# Patient Record
Sex: Female | Born: 1995
Health system: Southern US, Community
[De-identification: ages and names within clinical notes are randomized; demographics above are authoritative.]

## PROBLEM LIST (undated history)

## (undated) ENCOUNTER — Emergency Department (HOSPITAL_COMMUNITY): Admission: EM | Payer: Medicaid Other | Source: Home / Self Care

## (undated) DIAGNOSIS — I1A0 Resistant hypertension: Secondary | ICD-10-CM

## (undated) DIAGNOSIS — I517 Cardiomegaly: Secondary | ICD-10-CM

## (undated) DIAGNOSIS — K219 Gastro-esophageal reflux disease without esophagitis: Secondary | ICD-10-CM

## (undated) DIAGNOSIS — R9431 Abnormal electrocardiogram [ECG] [EKG]: Secondary | ICD-10-CM

## (undated) DIAGNOSIS — T783XXA Angioneurotic edema, initial encounter: Secondary | ICD-10-CM

## (undated) DIAGNOSIS — G43909 Migraine, unspecified, not intractable, without status migrainosus: Secondary | ICD-10-CM

## (undated) DIAGNOSIS — N179 Acute kidney failure, unspecified: Secondary | ICD-10-CM

## (undated) DIAGNOSIS — N186 End stage renal disease: Secondary | ICD-10-CM

## (undated) DIAGNOSIS — M329 Systemic lupus erythematosus, unspecified: Secondary | ICD-10-CM

## (undated) DIAGNOSIS — IMO0002 Reserved for concepts with insufficient information to code with codable children: Secondary | ICD-10-CM

## (undated) DIAGNOSIS — D75A Glucose-6-phosphate dehydrogenase (G6PD) deficiency without anemia: Secondary | ICD-10-CM

## (undated) DIAGNOSIS — I10 Essential (primary) hypertension: Secondary | ICD-10-CM

## (undated) DIAGNOSIS — Z992 Dependence on renal dialysis: Secondary | ICD-10-CM

## (undated) DIAGNOSIS — M351 Other overlap syndromes: Secondary | ICD-10-CM

## (undated) DIAGNOSIS — N289 Disorder of kidney and ureter, unspecified: Secondary | ICD-10-CM

## (undated) HISTORY — DX: End stage renal disease: N18.6

## (undated) HISTORY — DX: Cardiomegaly: I51.7

## (undated) HISTORY — DX: Abnormal electrocardiogram (ECG) (EKG): R94.31

## (undated) HISTORY — PX: MULTIPLE TOOTH EXTRACTIONS: SHX2053

---
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
## 2014-01-05 ENCOUNTER — Encounter: Payer: Self-pay | Admitting: Obstetrics

## 2014-01-05 ENCOUNTER — Ambulatory Visit (INDEPENDENT_AMBULATORY_CARE_PROVIDER_SITE_OTHER): Payer: Medicaid Other | Admitting: Obstetrics

## 2014-01-05 VITALS — BP 116/77 | HR 80 | Temp 98.2°F | Ht 65.0 in | Wt 226.0 lb

## 2014-01-05 DIAGNOSIS — Z3202 Encounter for pregnancy test, result negative: Secondary | ICD-10-CM

## 2014-01-05 DIAGNOSIS — Z3046 Encounter for surveillance of implantable subdermal contraceptive: Secondary | ICD-10-CM

## 2014-01-05 DIAGNOSIS — Z309 Encounter for contraceptive management, unspecified: Secondary | ICD-10-CM

## 2014-01-05 DIAGNOSIS — Z30017 Encounter for initial prescription of implantable subdermal contraceptive: Secondary | ICD-10-CM

## 2014-01-05 MED ORDER — ETONOGESTREL 68 MG ~~LOC~~ IMPL: 68.0000 mg | DRUG_IMPLANT | Freq: Once | SUBCUTANEOUS | Status: DC

## 2014-01-05 NOTE — Progress Notes (Signed)
Nexplanon Procedure Note   PRE-OP DIAGNOSIS: desired long-term, reversible contraception  POST-OP DIAGNOSIS: Same  PROCEDURE: Nexplanon  placement Performing Provider: Clenton Pare. Jodi Mourning MD  Patient education prior to procedure, explained risk, benefits of Nexplanon, reviewed alternative options. Patient reported understanding. Gave consent to continue with procedure.   PROCEDURE:  Pregnancy Text :  Negative Site (check):      left arm         Sterile Preparation:   Betadinex3 Lot # [id number] / [id number] Expiration Date 07 / 2017  Insertion site was selected 8 - 10 cm from medial epicondyle and marked along with guiding site using sterile marker. Procedure area was prepped and draped in a sterile fashion. 1% Lidocaine 1.5 ml given prior to procedure. Nexplanon  was inserted subcutaneously.Needle was removed from the insertion site. Nexplanon capsule was palpated by provider and patient to assure satisfactory placement. Dressing applied.  Followup: The patient tolerated the procedure well without complications.  Standard post-procedure care is explained and return precautions are given.  Charles A. Jodi Mourning MD

## 2014-01-05 NOTE — Progress Notes (Signed)
[place] REMOVAL NOTE  Date of LMP:   unknown  Contraception used: *Nexplanon   Indications:  The patient desires removal of Nexplanon.  She understands risks, benefits, and alternatives to Implanon and would like to proceed.  Anesthesia:   Lidocaine 1% plain.  Procedure:  A time-out was performed confirming the procedure and the patient's allergy status.  Complications: None                      The rod was palpated and the area was sterilely prepped.  The area beneath the distal tip was anesthetized with 1% xylocaine and the skin incised                       Over the tip and the tip was exposed, grasped with forcep and removed intact.  A single suture of 4-0 Vicryl was used to close incision.  Steri strip                       And a bandage applied and the arm was wrapped with gauze bandage.  The patient tolerated well.  Instructions:  The patient was instructed to remove the dressing in 24 hours and that some bruising is to be expected.  She was advised to use over the counter analgesics as needed for any pain at the site.  She is to keep the area dry for 24 hours and to call if her hand or arm becomes cold, numb, or blue.  Return visit:  Return in 2 weeks

## 2014-01-08 ENCOUNTER — Encounter: Payer: Self-pay | Admitting: Obstetrics

## 2014-01-09 ENCOUNTER — Encounter: Payer: Self-pay | Admitting: Advanced Practice Midwife

## 2014-01-19 ENCOUNTER — Encounter: Payer: Self-pay | Admitting: Advanced Practice Midwife

## 2014-01-19 ENCOUNTER — Ambulatory Visit (INDEPENDENT_AMBULATORY_CARE_PROVIDER_SITE_OTHER): Payer: Medicaid Other | Admitting: Advanced Practice Midwife

## 2014-01-19 VITALS — BP 120/78 | HR 91 | Temp 98.0°F | Wt 224.0 lb

## 2014-01-19 DIAGNOSIS — A599 Trichomoniasis, unspecified: Secondary | ICD-10-CM | POA: Insufficient documentation

## 2014-01-19 DIAGNOSIS — Z975 Presence of (intrauterine) contraceptive device: Secondary | ICD-10-CM

## 2014-01-19 DIAGNOSIS — IMO0002 Reserved for concepts with insufficient information to code with codable children: Secondary | ICD-10-CM | POA: Insufficient documentation

## 2014-01-19 DIAGNOSIS — Z113 Encounter for screening for infections with a predominantly sexual mode of transmission: Secondary | ICD-10-CM

## 2014-01-19 DIAGNOSIS — Z789 Other specified health status: Secondary | ICD-10-CM

## 2014-01-19 NOTE — Progress Notes (Signed)
Subjective:     Patient ID: Linda [name], female   DOB: 1996/03/06, 18 y.o.   MRN: [id number]  Exposure to STD  The patient's pertinent negatives include no discharge, dyspareunia, dysuria, genital itching, genital lesions or pelvic pain. Primary symptoms comment: Patient is S/P treatment of trich. Reports partner did not get evaluated or treated. She is currently asymptomatic. Also here today for Nexplanon check.. The vaginal discharge was normal. Pertinent negatives include no abdominal pain, genital odor or urinary frequency. She has tried nothing for the symptoms. Risk factors include history of STDs.     Review of Systems  Constitutional: Negative.   Cardiovascular: Negative.   Gastrointestinal: Negative.  Negative for abdominal pain.  Genitourinary: Negative.  Negative for dysuria, frequency, pelvic pain and dyspareunia.  Musculoskeletal: Negative.   Skin: Negative.   Neurological: Negative.   Hematological: Negative.   Psychiatric/Behavioral: Negative.        Objective:   Physical Exam  Constitutional: She is oriented to person, place, and time. She appears well-developed and well-nourished.  Musculoskeletal: Normal range of motion.       Arms: Neurological: She is alert and oriented to person, place, and time.  Skin: Skin is warm and dry.  Psychiatric: She has a normal mood and affect. Her behavior is normal. Judgment and thought content normal.   Filed Vitals:   01/19/14 1020  BP: 120/78  Pulse: 91  Temp: 98 F (36.7 C)   Filed Vitals:   01/19/14 1020  Weight: 224 lb (101.606 kg)        Assessment:     +Trich, partner did not get treatment Nexplanon in place  Patient Active Problem List   Diagnosis Date Noted  . Trichimoniasis 01/19/2014  . High BMI 01/19/2014  . Implanon in place 01/19/2014        Plan:     Patient's partner has changed his mind and plans to be evaluated at the health dept. Patient to RTC if symptoms return. I also will retreat  patient once her partner has completed the medication. Nexplanon in place and patient w/ out complaints. Remove PRN or in 3 years. Remaining STI panel completed today.  Amy Roni Bread CNM

## 2014-01-20 LAB — HEPATITIS B SURFACE ANTIGEN: Hepatitis B Surface Ag: NEGATIVE

## 2014-01-20 LAB — HEPATITIS C ANTIBODY: HCV Ab: NEGATIVE

## 2014-01-20 LAB — RPR

## 2014-01-20 LAB — HIV ANTIBODY (ROUTINE TESTING W REFLEX): HIV 1&2 Ab, 4th Generation: NONREACTIVE

## 2014-02-25 ENCOUNTER — Emergency Department (HOSPITAL_COMMUNITY)
Admission: EM | Admit: 2014-02-25 | Discharge: 2014-02-25 | Disposition: A | Discharge status: Home / Self Care | Payer: Medicaid Other | Attending: Emergency Medicine | Admitting: Emergency Medicine

## 2014-02-25 ENCOUNTER — Encounter (HOSPITAL_COMMUNITY): Payer: Self-pay | Admitting: Emergency Medicine

## 2014-02-25 DIAGNOSIS — F17200 Nicotine dependence, unspecified, uncomplicated: Secondary | ICD-10-CM | POA: Insufficient documentation

## 2014-02-25 DIAGNOSIS — Z79899 Other long term (current) drug therapy: Secondary | ICD-10-CM | POA: Insufficient documentation

## 2014-02-25 DIAGNOSIS — Z8739 Personal history of other diseases of the musculoskeletal system and connective tissue: Secondary | ICD-10-CM | POA: Insufficient documentation

## 2014-02-25 DIAGNOSIS — J029 Acute pharyngitis, unspecified: Secondary | ICD-10-CM | POA: Insufficient documentation

## 2014-02-25 DIAGNOSIS — G43909 Migraine, unspecified, not intractable, without status migrainosus: Secondary | ICD-10-CM | POA: Insufficient documentation

## 2014-02-25 LAB — RAPID STREP SCREEN (MED CTR MEBANE ONLY): Streptococcus, Group A Screen (Direct): NEGATIVE

## 2014-02-25 MED ORDER — IBUPROFEN 100 MG/5ML PO SUSP
600.0000 mg | Freq: Once | ORAL | Status: AC
  Administered 2014-02-25: 600 mg via ORAL
  Filled 2014-02-25: qty 30

## 2014-02-25 MED ORDER — IBUPROFEN 100 MG/5ML PO SUSP
600.0000 mg | Freq: Once | ORAL | Status: DC
  Filled 2014-02-25: qty 30

## 2014-02-25 MED ORDER — DEXAMETHASONE SODIUM PHOSPHATE 10 MG/ML IJ SOLN
10.0000 mg | Freq: Once | INTRAMUSCULAR | Status: AC
Start: 2014-02-25 — End: 2014-02-25
  Administered 2014-02-25: 10 mg via INTRAMUSCULAR
  Filled 2014-02-25: qty 1

## 2014-02-25 MED ORDER — PENICILLIN G BENZATHINE 1200000 UNIT/2ML IM SUSP
1.2000 10*6.[IU] | Freq: Once | INTRAMUSCULAR | Status: AC
  Administered 2014-02-25: 1.2 10*6.[IU] via INTRAMUSCULAR
  Filled 2014-02-25: qty 2

## 2014-02-25 NOTE — ED Notes (Signed)
Pharmacy sent syringes of Ibuprofen 100mg /60ml but when scanned showed as 20 mg per syringe.  Called pharmacy, was instructed to send back and Ibuprofen resent.

## 2014-02-25 NOTE — ED Notes (Signed)
Patient declines wheelchair at discharge.  Patient escorted to lobby by RN.

## 2014-02-25 NOTE — ED Provider Notes (Signed)
CSN: [id number]     Arrival date & time 02/25/14  [id number] History  This chart was scribed for Mercy Moore PA-C, a non-physician practitioner working with Orpah Greek, * by Denice Bors, ED Scribe. This patient was seen in room TR09C/TR09C and the patient's care was started at 11:10 AM    Chief Complaint  Patient presents with  . Sore Throat    The history is provided by the patient. No language interpreter was used.   HPI Comments: Linda [name] is a 18 y.o. female who presents to the Emergency Department with PMH of lupus and mixed connective tissue diease complaining of constant moderate sore throat onset 2 days. Describes sore throat as gradually worsening in severity. Reports associated myalgias, rhinorrhea, and pain with swallowing. Denies trying any alleviating factors. Reports pain is exacerbated with swallowing. Denies associated fever, dysphagia, voice change, otalgia, emesis, abdominal pain, diarrhea, and cough. No concerns for STDs. No known sick contacts.     Past Medical History  Diagnosis Date  . Lupus   . Migraines   . Mixed connective tissue disease    History reviewed. No pertinent past surgical history. History reviewed. No pertinent family history. History  Substance Use Topics  . Smoking status: Current Every Day Smoker  . Smokeless tobacco: Not on file  . Alcohol Use: No   OB History   Grav Para Term Preterm Abortions TAB SAB Ect Mult Living                  Review of Systems  Constitutional: Negative for fever, chills, activity change, appetite change and fatigue.  HENT: Positive for congestion, rhinorrhea and sore throat. Negative for ear discharge, ear pain, trouble swallowing and voice change.   Respiratory: Negative for cough.   Gastrointestinal: Negative.  Negative for nausea, vomiting and abdominal pain.  Musculoskeletal: Positive for myalgias. Negative for neck pain and neck stiffness.  Skin: Negative for rash.   Neurological: Negative for weakness and headaches.    Allergies  Review of patient's allergies indicates no known allergies.  Home Medications   Prior to Admission medications   Medication Sig Start Date End Date Taking? Authorizing Provider  etonogestrel (IMPLANON) 68 MG IMPL implant Inject 1 each into the skin once.   Yes Historical Provider, MD  ibuprofen (ADVIL,MOTRIN) 800 MG tablet Take 800 mg by mouth every 8 (eight) hours as needed for moderate pain.   Yes Historical Provider, MD  ondansetron (ZOFRAN ODT) 8 MG disintegrating tablet Take 1 tablet (8 mg total) by mouth every 8 (eight) hours as needed for nausea or vomiting. 12/25/13  Yes Kalman Drape, MD  SUMAtriptan (IMITREX) 100 MG tablet Take 100 mg by mouth every 2 (two) hours as needed for migraine or headache. May repeat in 2 hours if headache persists or recurs.   Yes Historical Provider, MD  topiramate (TOPAMAX) 25 MG tablet Take 100 mg by mouth daily.   Yes Historical Provider, MD   BP 120/65  Pulse 81  Temp(Src) 98.6 F (37 C) (Oral)  Resp 22  Ht 5\' 6"  (1.676 m)  Wt 222 lb 3.2 oz (100.789 kg)  BMI 35.88 kg/m2  SpO2 100%  Filed Vitals:   02/25/14 0952 02/25/14 1136  BP: 120/65 115/62  Pulse: 81 84  Temp: 98.6 F (37 C) 98.8 F (37.1 C)  TempSrc: Oral Oral  Resp: 22 18  Height: 5\' 6"  (1.676 m)   Weight: 222 lb 3.2 oz (100.789 kg)   SpO2:  100% 100%    Physical Exam  Nursing note and vitals reviewed. Constitutional: She is oriented to person, place, and time. She appears well-developed and well-nourished. No distress.  Non-toxic  HENT:  Head: Normocephalic and atraumatic.  Right Ear: Tympanic membrane, external ear and ear canal normal.  Left Ear: Tympanic membrane, external ear and ear canal normal.  Mouth/Throat: Uvula is midline. No trismus in the jaw. Oropharyngeal exudate and posterior oropharyngeal erythema present. No posterior oropharyngeal edema or tonsillar abscesses.  Tonsillar hypertrophy 2+  bilaterally with exudates. Uvula midline. No trismus. No difficulty controlling secretions. No voice change. Tympanic membranes gray and translucent bilaterally with no erythema, edema, or hemotympanum.  No mastoid or tragal tenderness bilaterally.   Eyes: Conjunctivae and EOM are normal. Pupils are equal, round, and reactive to light. Right eye exhibits no discharge. Left eye exhibits no discharge.  Neck: Normal range of motion. Neck supple.  Bilateral anterior cervical lymphadenopathy. Lymph nodes <1 cm, firm, mobile and non-tender  Cardiovascular: Normal rate, regular rhythm and normal heart sounds.  Exam reveals no gallop and no friction rub.   No murmur heard. Pulmonary/Chest: Effort normal and breath sounds normal. No respiratory distress. She has no wheezes. She has no rales. She exhibits no tenderness.  Abdominal: Soft. She exhibits no distension. There is no tenderness. There is no rebound and no guarding.  Musculoskeletal: Normal range of motion. She exhibits no edema and no tenderness.  Lymphadenopathy:    She has cervical adenopathy.  Neurological: She is alert and oriented to person, place, and time.  Skin: Skin is warm and dry.  Psychiatric: She has a normal mood and affect. Her behavior is normal.    ED Course  Procedures (including critical care time) COORDINATION OF CARE:  Nursing notes reviewed. Vital signs reviewed. Initial pt interview and examination performed.   Filed Vitals:   02/25/14 0952 02/25/14 1136  BP: 120/65 115/62  Pulse: 81 84  Temp: 98.6 F (37 C) 98.8 F (37.1 C)  TempSrc: Oral Oral  Resp: 22 18  Height: 5\' 6"  (1.676 m)   Weight: 222 lb 3.2 oz (100.789 kg)   SpO2: 100% 100%    11:21 AM-Discussed work up plan with pt at bedside, which includes  Orders Placed This Encounter  Procedures  . Rapid strep screen    Standing Status: Standing     Number of Occurrences: 1     Standing Expiration Date:   . Culture, Group A Strep    Standing Status:  Standing     Number of Occurrences: 1     Standing Expiration Date:   . Pt agrees with plan.   Treatment plan initiated:Medications - No data to display   Initial diagnostic testing ordered.       Labs Review Labs Reviewed  RAPID STREP SCREEN  CULTURE, GROUP A STREP    Imaging Review No results found.   EKG Interpretation None      Results for orders placed during the hospital encounter of 02/25/14  RAPID STREP SCREEN      Result Value Ref Range   Streptococcus, Group A Screen (Direct) NEGATIVE  NEGATIVE     MDM   Linda [name] is a 18 y.o. female who presents to the Emergency Department with PMH of lupus and mixed connective tissue disease complaining of constant moderate sore throat onset 2 days. Etiology of sore throat likely due to pharyngitis. Rapid strep negative. Due to patient's PMH and clinical presentation will treat with IM  penicillin, however, this may be viral in nature. Patient also given dose of decadron IM for tonsillar edema. Patient not on chronic steroids. No evidence of peritonsillar or retropharyngeal abscess. Patient afebrile and non-toxic in appearance. Patient able to tolerate PO challenge without difficulty. Instructed patient to follow-up with PCP if symptoms not improving or worsening. Return precautions, discharge instructions, and follow-up was discussed with the patient before discharge.     Discharge Medication List as of 02/25/2014 11:24 AM       Final impressions: 1. Pharyngitis      Mercy Moore PA-C   I personally performed the services described in this documentation, which was scribed in my presence. The recorded information has been reviewed and is accurate.         Lucila [place], PA-C 02/25/14 1538

## 2014-02-25 NOTE — ED Notes (Signed)
Per pt sts sore throat since yesterday. sts hurts to swallow and body aches.

## 2014-02-25 NOTE — Discharge Instructions (Signed)
Take ibuprofen for pain  Drink fluids and rest  Return to the emergency department if you develop any changing/worsening condition, inability to swallow, fever, changing voice, or any other concerns (please read additional information regarding your condition below)    Pharyngitis Pharyngitis is redness, pain, and swelling (inflammation) of your pharynx.  CAUSES  Pharyngitis is usually caused by infection. Most of the time, these infections are from viruses (viral) and are part of a cold. However, sometimes pharyngitis is caused by bacteria (bacterial). Pharyngitis can also be caused by allergies. Viral pharyngitis may be spread from person to person by coughing, sneezing, and personal items or utensils (cups, forks, spoons, toothbrushes). Bacterial pharyngitis may be spread from person to person by more intimate contact, such as kissing.  SIGNS AND SYMPTOMS  Symptoms of pharyngitis include:   Sore throat.   Tiredness (fatigue).   Low-grade fever.   Headache.  Joint pain and muscle aches.  Skin rashes.  Swollen lymph nodes.  Plaque-like film on throat or tonsils (often seen with bacterial pharyngitis). DIAGNOSIS  Your health care provider will ask you questions about your illness and your symptoms. Your medical history, along with a physical exam, is often all that is needed to diagnose pharyngitis. Sometimes, a rapid strep test is done. Other lab tests may also be done, depending on the suspected cause.  TREATMENT  Viral pharyngitis will usually get better in 3-4 days without the use of medicine. Bacterial pharyngitis is treated with medicines that kill germs (antibiotics).  HOME CARE INSTRUCTIONS   Drink enough water and fluids to keep your urine clear or pale yellow.   Only take over-the-counter or prescription medicines as directed by your health care provider:   If you are prescribed antibiotics, make sure you finish them even if you start to feel better.   Do not  take aspirin.   Get lots of rest.   Gargle with 8 oz of salt water ( tsp of salt per 1 qt of water) as often as every 1-2 hours to soothe your throat.   Throat lozenges (if you are not at risk for choking) or sprays may be used to soothe your throat. SEEK MEDICAL CARE IF:   You have large, tender lumps in your neck.  You have a rash.  You cough up green, yellow-brown, or bloody spit. SEEK IMMEDIATE MEDICAL CARE IF:   Your neck becomes stiff.  You drool or are unable to swallow liquids.  You vomit or are unable to keep medicines or liquids down.  You have severe pain that does not go away with the use of recommended medicines.  You have trouble breathing (not caused by a stuffy nose). MAKE SURE YOU:   Understand these instructions.  Will watch your condition.  Will get help right away if you are not doing well or get worse. Document Released: 08/17/2005 Document Revised: 06/07/2013 Document Reviewed: 04/24/2013 [facility] Patient Information 2015 [place], [place]. This information is not intended to replace advice given to you by your health care provider. Make sure you discuss any questions you have with your health care provider.

## 2014-02-26 NOTE — ED Provider Notes (Signed)
Medical screening examination/treatment/procedure(s) were performed by non-physician practitioner and as supervising physician I was immediately available for consultation/collaboration.   EKG Interpretation None        Orpah Greek, MD 02/26/14 1623

## 2014-02-27 LAB — CULTURE, GROUP A STREP

## 2014-02-28 ENCOUNTER — Telehealth (HOSPITAL_BASED_OUTPATIENT_CLINIC_OR_DEPARTMENT_OTHER): Payer: Self-pay

## 2014-02-28 NOTE — Telephone Encounter (Signed)
Post ED Visit - Positive Culture Follow-up  Culture report reviewed by antimicrobial stewardship pharmacist: [x]  Wes Dulaney, Pharm.D., BCPS []  Heide Guile, Pharm.D., BCPS []  Alycia Rossetti, Pharm.D., BCPS []  [place], [place].D., BCPS, AAHIVP []  Legrand Como, Pharm.D., BCPS, AAHIVP []    Positive Group A Strep culture Treated with PCN IM in ED, organism sensitive to the same and no further patient follow-up is required at this time.  Dortha Kern 02/28/2014, 2:57 PM

## 2015-02-24 ENCOUNTER — Emergency Department (HOSPITAL_COMMUNITY)
Admission: EM | Admit: 2015-02-24 | Discharge: 2015-02-25 | Disposition: A | Discharge status: Home / Self Care | Payer: Medicaid Other | Attending: Emergency Medicine | Admitting: Emergency Medicine

## 2015-02-24 ENCOUNTER — Emergency Department (HOSPITAL_COMMUNITY): Payer: Medicaid Other

## 2015-02-24 ENCOUNTER — Encounter (HOSPITAL_COMMUNITY): Payer: Self-pay | Admitting: *Deleted

## 2015-02-24 DIAGNOSIS — Z720 Tobacco use: Secondary | ICD-10-CM | POA: Diagnosis not present

## 2015-02-24 DIAGNOSIS — M25571 Pain in right ankle and joints of right foot: Secondary | ICD-10-CM | POA: Diagnosis present

## 2015-02-24 DIAGNOSIS — L03115 Cellulitis of right lower limb: Secondary | ICD-10-CM | POA: Insufficient documentation

## 2015-02-24 DIAGNOSIS — Z79899 Other long term (current) drug therapy: Secondary | ICD-10-CM | POA: Insufficient documentation

## 2015-02-24 DIAGNOSIS — Z8679 Personal history of other diseases of the circulatory system: Secondary | ICD-10-CM | POA: Insufficient documentation

## 2015-02-24 DIAGNOSIS — X58XXXD Exposure to other specified factors, subsequent encounter: Secondary | ICD-10-CM | POA: Diagnosis not present

## 2015-02-24 DIAGNOSIS — S92901G Unspecified fracture of right foot, subsequent encounter for fracture with delayed healing: Secondary | ICD-10-CM

## 2015-02-24 DIAGNOSIS — S92341G Displaced fracture of fourth metatarsal bone, right foot, subsequent encounter for fracture with delayed healing: Secondary | ICD-10-CM | POA: Insufficient documentation

## 2015-02-24 DIAGNOSIS — Z3202 Encounter for pregnancy test, result negative: Secondary | ICD-10-CM | POA: Insufficient documentation

## 2015-02-24 DIAGNOSIS — M25572 Pain in left ankle and joints of left foot: Secondary | ICD-10-CM | POA: Diagnosis not present

## 2015-02-24 DIAGNOSIS — L03119 Cellulitis of unspecified part of limb: Secondary | ICD-10-CM

## 2015-02-24 LAB — I-STAT CHEM 8, ED
BUN: 7 mg/dL (ref 6–20)
Calcium, Ion: 1.19 mmol/L (ref 1.12–1.23)
Chloride: 102 mmol/L (ref 101–111)
Creatinine, Ser: 0.7 mg/dL (ref 0.44–1.00)
Glucose, Bld: 77 mg/dL (ref 65–99)
HCT: 41 % (ref 36.0–46.0)
Hemoglobin: 13.9 g/dL (ref 12.0–15.0)
Potassium: 3.8 mmol/L (ref 3.5–5.1)
Sodium: 140 mmol/L (ref 135–145)
TCO2: 22 mmol/L (ref 0–100)

## 2015-02-24 LAB — CBC WITH DIFFERENTIAL/PLATELET
Basophils Absolute: 0.1 10*3/uL (ref 0.0–0.1)
Basophils Relative: 1 % (ref 0–1)
Eosinophils Absolute: 0.6 10*3/uL (ref 0.0–0.7)
Eosinophils Relative: 5 % (ref 0–5)
HCT: 37.8 % (ref 36.0–46.0)
Hemoglobin: 12.5 g/dL (ref 12.0–15.0)
Lymphocytes Relative: 39 % (ref 12–46)
Lymphs Abs: 4.2 10*3/uL — ABNORMAL HIGH (ref 0.7–4.0)
MCH: 31.6 pg (ref 26.0–34.0)
MCHC: 33.1 g/dL (ref 30.0–36.0)
MCV: 95.5 fL (ref 78.0–100.0)
Monocytes Absolute: 1.2 10*3/uL — ABNORMAL HIGH (ref 0.1–1.0)
Monocytes Relative: 11 % (ref 3–12)
Neutro Abs: 4.7 10*3/uL (ref 1.7–7.7)
Neutrophils Relative %: 44 % (ref 43–77)
Platelets: 202 10*3/uL (ref 150–400)
RBC: 3.96 MIL/uL (ref 3.87–5.11)
RDW: 12.8 % (ref 11.5–15.5)
WBC: 10.8 10*3/uL — ABNORMAL HIGH (ref 4.0–10.5)

## 2015-02-24 MED ORDER — OXYCODONE-ACETAMINOPHEN 5-325 MG PO TABS
1.0000 | ORAL_TABLET | Freq: Once | ORAL | Status: AC
  Administered 2015-02-25: 1 via ORAL
  Filled 2015-02-24: qty 1

## 2015-02-24 MED ORDER — CLINDAMYCIN HCL 300 MG PO CAPS
300.0000 mg | ORAL_CAPSULE | Freq: Once | ORAL | Status: AC
  Administered 2015-02-24: 300 mg via ORAL
  Filled 2015-02-24: qty 1

## 2015-02-24 NOTE — ED Notes (Signed)
Pt c/o left ankle swelling and pain, also c/o bilat leg pain and swelling. Pt has hx of Mixed Tissue Disease. States that the pain usually presents similar to this episode but the sores are slightly different.

## 2015-02-24 NOTE — ED Provider Notes (Addendum)
CSN: [id number]     Arrival date & time 02/24/15  2230 History  This chart was scribed for Linda Palumbo, MD by Randa Evens, ED Scribe. This patient was seen in room B16C/B16C and the patient's care was started at 11:09 PM.     Chief Complaint  Patient presents with  . Leg Pain   Patient is a 19 y.o. female presenting with leg pain. The history is provided by the patient. No language interpreter was used.  Leg Pain Location:  Ankle Time since incident:  2 days Injury: no   Ankle location:  L ankle and R ankle (right ankle is old and has been seen at [facility]) Pain details:    Quality:  Aching   Radiates to:  Does not radiate   Severity:  Mild   Onset quality:  Gradual   Duration:  2 days   Timing:  Constant   Progression:  Unchanged Chronicity:  Recurrent Relieved by:  None tried Worsened by:  Bearing weight (cold air) Ineffective treatments:  None tried Associated symptoms: swelling   Associated symptoms: no numbness and no tingling   Risk factors: no concern for non-accidental trauma and no frequent fractures    HPI Comments: Linda [name] is a 19 y.o. female with PMHx of Lupus, migraines and mixed connective tissue disease who presents to the Emergency Department complaining of left ankle pain onset 2 days prior. Pt states that she has associated swelling. Pt also reports that she is having right ankle pain. Pt states that the pain is worse when cold air touches her right ankle. Pt states she has a Hx of nausea and vomiting and has been vomiting over the past 2 days as well. Pt doesn't report any medications PTA. Pt denies numbness or tingling.     Past Medical History  Diagnosis Date  . Lupus   . Migraines   . Mixed connective tissue disease    History reviewed. No pertinent past surgical history. No family history on file. History  Substance Use Topics  . Smoking status: Current Every Day Smoker -- 0.00 packs/day    Types: Cigarettes, Cigars  . Smokeless tobacco:  Not on file  . Alcohol Use: Yes     Comment: social   OB History    No data available      Review of Systems  Musculoskeletal: Positive for joint swelling and arthralgias.  Skin: Positive for wound.  Neurological: Negative for numbness.  All other systems reviewed and are negative.   Allergies  Review of patient's allergies indicates no known allergies.  Home Medications   Prior to Admission medications   Medication Sig Start Date End Date Taking? Authorizing Provider  etonogestrel (IMPLANON) 68 MG IMPL implant Inject 1 each into the skin once.    Historical Provider, MD  ibuprofen (ADVIL,MOTRIN) 800 MG tablet Take 800 mg by mouth every 8 (eight) hours as needed for moderate pain.    Historical Provider, MD  ondansetron (ZOFRAN ODT) 8 MG disintegrating tablet Take 1 tablet (8 mg total) by mouth every 8 (eight) hours as needed for nausea or vomiting. 12/25/13   Linton Flemings, MD  SUMAtriptan (IMITREX) 100 MG tablet Take 100 mg by mouth every 2 (two) hours as needed for migraine or headache. May repeat in 2 hours if headache persists or recurs.    Historical Provider, MD  topiramate (TOPAMAX) 25 MG tablet Take 100 mg by mouth daily.    Historical Provider, MD   BP 130/84 mmHg  Pulse 94  Temp(Src) 98 F (36.7 C) (Oral)  Resp 18  Ht 5\' 6"  (1.676 m)  Wt 223 lb 6.4 oz (101.334 kg)  BMI 36.08 kg/m2  SpO2 100%   Physical Exam  Constitutional: She is oriented to person, place, and time. She appears well-developed and well-nourished. No distress.  HENT:  Head: Normocephalic and atraumatic.  Mouth/Throat: Oropharynx is clear and moist.  Eyes: Conjunctivae and EOM are normal. Pupils are equal, round, and reactive to light.  Neck: Neck supple. No tracheal deviation present.  Cardiovascular: Normal rate.   Pulmonary/Chest: Effort normal. No respiratory distress.  Abdominal: Soft. Bowel sounds are normal. There is no tenderness.  Musculoskeletal: Normal range of motion.  Neurological:  She is alert and oriented to person, place, and time.  Skin: Skin is warm and dry. Lesion noted. There is erythema.  Erythema of instep 5 mm median dorsum of foot proximal with crusting and  1 inch of surrounding erythema. multiple other lesion about 8 mm in diameter with surrounding crusting and erythema. 8cm lesion crusted over on right lateral malleolus. bilateral feet NVI with cap refill less 2 seconds.    Psychiatric: She has a normal mood and affect. Her behavior is normal.  Nursing note and vitals reviewed.   ED Course  Procedures (including critical care time) DIAGNOSTIC STUDIES: Oxygen Saturation is 100% on RA, normal by my interpretation.    COORDINATION OF CARE: 11:48 PM-Discussed treatment plan with pt at bedside and pt agreed to plan.  1:04 AM- Will discharge with post op shoe on left and tell her to follow up with [facility]. Pt will also be discharged with clindamycin and topical mupirocin for her lesions.     Labs Review Labs Reviewed - No data to display  Imaging Review No results found.   EKG Interpretation None      MDM   Final diagnoses:  None   Scab on the right ankle has been imaged multiple times at [facility]  Will start clindamycin and mupiricin and have patient follow up with [facility] physicians.  Patient informed of finding of old metatarsal fracture (no pain over that location).  Patient cannot remember trauma.  Placed in post op shoe and given referral to orthopedics.  Patient verbalizes understanding and agrees to follow up  I personally performed the services described in this documentation, which was scribed in my presence. The recorded information has been reviewed and is accurate.       Veatrice Kells, MD 02/25/15 [id number]  Veatrice Kells, MD 02/25/15 [id number]

## 2015-02-25 ENCOUNTER — Encounter (HOSPITAL_COMMUNITY): Payer: Self-pay | Admitting: Emergency Medicine

## 2015-02-25 LAB — I-STAT BETA HCG BLOOD, ED (MC, WL, AP ONLY): I-stat hCG, quantitative: <5 m[IU]/mL (ref <5)

## 2015-02-25 MED ORDER — CLINDAMYCIN HCL 300 MG PO CAPS: 300.0000 mg | ORAL_CAPSULE | Freq: Four times a day (QID) | ORAL | Status: DC

## 2015-02-25 MED ORDER — MUPIROCIN CALCIUM 2 % EX CREA: 1.0000 "application " | TOPICAL_CREAM | Freq: Two times a day (BID) | CUTANEOUS | Status: DC

## 2015-02-25 MED ORDER — MELOXICAM 7.5 MG PO TABS: 7.5000 mg | ORAL_TABLET | Freq: Every day | ORAL | Status: DC

## 2015-02-25 NOTE — ED Notes (Signed)
Pt. Left with all belongings 

## 2015-03-30 ENCOUNTER — Encounter (HOSPITAL_COMMUNITY): Payer: Self-pay

## 2015-03-30 ENCOUNTER — Emergency Department (HOSPITAL_COMMUNITY)
Admission: EM | Admit: 2015-03-30 | Discharge: 2015-03-31 | Disposition: A | Discharge status: Home / Self Care | Payer: Medicaid Other | Attending: Emergency Medicine | Admitting: Emergency Medicine

## 2015-03-30 DIAGNOSIS — G43909 Migraine, unspecified, not intractable, without status migrainosus: Secondary | ICD-10-CM | POA: Insufficient documentation

## 2015-03-30 DIAGNOSIS — Z79899 Other long term (current) drug therapy: Secondary | ICD-10-CM | POA: Insufficient documentation

## 2015-03-30 DIAGNOSIS — Z872 Personal history of diseases of the skin and subcutaneous tissue: Secondary | ICD-10-CM | POA: Insufficient documentation

## 2015-03-30 DIAGNOSIS — Z3202 Encounter for pregnancy test, result negative: Secondary | ICD-10-CM | POA: Insufficient documentation

## 2015-03-30 DIAGNOSIS — R1013 Epigastric pain: Secondary | ICD-10-CM | POA: Diagnosis not present

## 2015-03-30 DIAGNOSIS — R1012 Left upper quadrant pain: Secondary | ICD-10-CM | POA: Diagnosis not present

## 2015-03-30 DIAGNOSIS — Z720 Tobacco use: Secondary | ICD-10-CM | POA: Diagnosis not present

## 2015-03-30 DIAGNOSIS — R1084 Generalized abdominal pain: Secondary | ICD-10-CM | POA: Diagnosis present

## 2015-03-30 DIAGNOSIS — R112 Nausea with vomiting, unspecified: Secondary | ICD-10-CM | POA: Insufficient documentation

## 2015-03-30 DIAGNOSIS — Z791 Long term (current) use of non-steroidal anti-inflammatories (NSAID): Secondary | ICD-10-CM | POA: Insufficient documentation

## 2015-03-30 DIAGNOSIS — Z8739 Personal history of other diseases of the musculoskeletal system and connective tissue: Secondary | ICD-10-CM | POA: Diagnosis not present

## 2015-03-30 LAB — URINALYSIS, ROUTINE W REFLEX MICROSCOPIC
Bilirubin Urine: NEGATIVE
Glucose, UA: NEGATIVE mg/dL
Hgb urine dipstick: NEGATIVE
Ketones, ur: NEGATIVE mg/dL
Nitrite: NEGATIVE
Protein, ur: NEGATIVE mg/dL
Specific Gravity, Urine: 1.019 (ref 1.005–1.030)
Urobilinogen, UA: 1 mg/dL (ref 0.0–1.0)
pH: 6 (ref 5.0–8.0)

## 2015-03-30 LAB — CBC
HCT: 40.5 % (ref 36.0–46.0)
Hemoglobin: 13.4 g/dL (ref 12.0–15.0)
MCH: 31.2 pg (ref 26.0–34.0)
MCHC: 33.1 g/dL (ref 30.0–36.0)
MCV: 94.4 fL (ref 78.0–100.0)
Platelets: 195 10*3/uL (ref 150–400)
RBC: 4.29 MIL/uL (ref 3.87–5.11)
RDW: 13 % (ref 11.5–15.5)
WBC: 8.9 10*3/uL (ref 4.0–10.5)

## 2015-03-30 LAB — URINE MICROSCOPIC-ADD ON

## 2015-03-30 LAB — POC URINE PREG, ED: Preg Test, Ur: NEGATIVE

## 2015-03-30 NOTE — ED Provider Notes (Signed)
CSN: [id number]   Arrival date & time 03/30/15 2306  History  This chart was scribed for  Linda Freiberg, MD by Altamease Oiler, ED Scribe. This patient was seen in room D34C/D34C and the patient's care was started at 11:42 PM.  Chief Complaint  Patient presents with  . Abdominal Pain    HPI The history is provided by the patient. No language interpreter was used.   KARRIANN [name] is a 19 y.o. female with PMHx of lupus and mixed connective tissue disease who presents to the Emergency Department complaining of constant and severe abdominal pain with sudden onset 3 days ago. The pain is generalized across the abdomen and worse in the center. Eating exacerbates the pain. She has had daily pain of similar quality that is typically less severe. Associated symptoms include nausea and vomiting that is worse after eating. Pt denies diarrhea, constipation, fever, dysuria, cough, congestion, and sore throat.    Past Medical History  Diagnosis Date  . Lupus   . Migraines   . Mixed connective tissue disease     History reviewed. No pertinent past surgical history.  No family history on file.  History  Substance Use Topics  . Smoking status: Current Every Day Smoker -- 0.00 packs/day    Types: Cigarettes, Cigars  . Smokeless tobacco: Not on file  . Alcohol Use: Yes     Comment: social     Review of Systems  Constitutional: Negative for fever.  HENT: Negative for congestion and sore throat.   Gastrointestinal: Positive for nausea, vomiting and abdominal pain. Negative for diarrhea and constipation.  Genitourinary: Negative for dysuria.  All other systems reviewed and are negative.    Home Medications   Prior to Admission medications   Medication Sig Start Date End Date Taking? Authorizing Provider  clindamycin (CLEOCIN) 300 MG capsule Take 1 capsule (300 mg total) by mouth 4 (four) times daily. X 7 days 02/25/15   April Palumbo, MD  etonogestrel (IMPLANON) 68 MG IMPL implant Inject 1  each into the skin once.    Historical Provider, MD  ibuprofen (ADVIL,MOTRIN) 800 MG tablet Take 800 mg by mouth every 8 (eight) hours as needed for moderate pain.    Historical Provider, MD  meloxicam (MOBIC) 7.5 MG tablet Take 1 tablet (7.5 mg total) by mouth daily. 02/25/15   April Palumbo, MD  mupirocin cream (BACTROBAN) 2 % Apply 1 application topically 2 (two) times daily. 02/25/15   April Palumbo, MD  ondansetron (ZOFRAN ODT) 8 MG disintegrating tablet Take 1 tablet (8 mg total) by mouth every 8 (eight) hours as needed for nausea or vomiting. 12/25/13   Linton Flemings, MD  oxyCODONE-acetaminophen (PERCOCET/ROXICET) 5-325 MG per tablet Take 1-2 tablets by mouth every 4 (four) hours as needed for severe pain. 03/31/15   Linda Freiberg, MD  SUMAtriptan (IMITREX) 100 MG tablet Take 100 mg by mouth every 2 (two) hours as needed for migraine or headache. May repeat in 2 hours if headache persists or recurs.    Historical Provider, MD  topiramate (TOPAMAX) 25 MG tablet Take 100 mg by mouth daily.    Historical Provider, MD    Allergies  Review of patient's allergies indicates no known allergies.  Triage Vitals: BP 142/93 mmHg  Pulse 71  Temp(Src) 97.5 F (36.4 C) (Oral)  Resp 18  Ht 5\' 6"  (1.676 m)  Wt 221 lb 5 oz (100.387 kg)  BMI 35.74 kg/m2  SpO2 100%  Physical Exam  Constitutional: She is oriented  to person, place, and time. She appears well-developed and well-nourished.  HENT:  Head: Normocephalic and atraumatic.  Right Ear: External ear normal.  Left Ear: External ear normal.  Eyes: Conjunctivae and EOM are normal. Pupils are equal, round, and reactive to light.  Neck: Normal range of motion. Neck supple.  Cardiovascular: Normal rate, regular rhythm, normal heart sounds and intact distal pulses.   Pulmonary/Chest: Effort normal and breath sounds normal.  Abdominal: Soft. Bowel sounds are normal. There is tenderness in the epigastric area and left upper quadrant.  Musculoskeletal:  Normal range of motion.  Neurological: She is alert and oriented to person, place, and time.  Skin: Skin is warm and dry.  Vitals reviewed.   ED Course  Procedures   DIAGNOSTIC STUDIES: Oxygen Saturation is 100% on RA, normal by my interpretation.    COORDINATION OF CARE: 11:44 PM Discussed treatment plan which includes lab work with pt at bedside and pt agreed to plan.  12:48 AM I re-evaluated the patient and her pain has improved. I updated her on the results of her lab work and the plan for another round of pain medication before discharge.    Labs Review-  Labs Reviewed  COMPREHENSIVE METABOLIC PANEL - Abnormal; Notable for the following:    ALT 10 (*)    All other components within normal limits  URINALYSIS, ROUTINE W REFLEX MICROSCOPIC (NOT AT [facility]) - Abnormal; Notable for the following:    APPearance CLOUDY (*)    Leukocytes, UA SMALL (*)    All other components within normal limits  URINE MICROSCOPIC-ADD ON - Abnormal; Notable for the following:    Squamous Epithelial / LPF MANY (*)    All other components within normal limits  LIPASE, BLOOD  CBC  POC URINE PREG, ED  I-STAT CG4 LACTIC ACID, ED    Imaging Review No results found.  EKG Interpretation None     MDM   Final diagnoses:  Epigastric pain     19 y.o. female with pertinent PMH of undifferentiated vasculitis, chronic abd pain presents with acute on chronic abdominal pain as above. On arrival patient has vital signs and physical exam as above. She is able to take by mouth intake without difficulty. She has had no change in her bowel movements.  She is well-appearing, has epigastric and left upper quadrant tenderness. She was given Dilaudid and a GI cocktail with improvement in symptoms. Labs as above unremarkable. Doubt cholecystitis, pancreatitis, other emergent pathology. Suspect etiology of pain is likely related to her vasculitis, will have her follow-up with her primary care doctor. Discharged  home in stable condition..    I have reviewed all laboratory and imaging studies if ordered as above  1. Epigastric pain          Linda Freiberg, MD 03/31/15 [phone]

## 2015-03-30 NOTE — ED Notes (Addendum)
Pt here for abd pain for the past 3 days. Reports N/V but no diarrhea. Emesis PTA. No fever. No vaginal bleeding or discharge noted

## 2015-03-31 LAB — COMPREHENSIVE METABOLIC PANEL
ALT: 10 U/L — ABNORMAL LOW (ref 14–54)
AST: 24 U/L (ref 15–41)
Albumin: 3.7 g/dL (ref 3.5–5.0)
Alkaline Phosphatase: 69 U/L (ref 38–126)
Anion gap: 7 (ref 5–15)
BUN: 6 mg/dL (ref 6–20)
CO2: 23 mmol/L (ref 22–32)
Calcium: 9.3 mg/dL (ref 8.9–10.3)
Chloride: 107 mmol/L (ref 101–111)
Creatinine, Ser: 0.67 mg/dL (ref 0.44–1.00)
GFR calc Af Amer: >60 mL/min (ref >60)
GFR calc non Af Amer: >60 mL/min (ref >60)
Glucose, Bld: 85 mg/dL (ref 65–99)
Potassium: 3.9 mmol/L (ref 3.5–5.1)
Sodium: 137 mmol/L (ref 135–145)
Total Bilirubin: 0.4 mg/dL (ref 0.3–1.2)
Total Protein: 7.1 g/dL (ref 6.5–8.1)

## 2015-03-31 LAB — I-STAT CG4 LACTIC ACID, ED: Lactic Acid, Venous: 0.92 mmol/L (ref 0.5–2.0)

## 2015-03-31 LAB — LIPASE, BLOOD: Lipase: 24 U/L (ref 22–51)

## 2015-03-31 MED ORDER — HYDROMORPHONE HCL 1 MG/ML IJ SOLN
1.0000 mg | Freq: Once | INTRAMUSCULAR | Status: AC
  Administered 2015-03-31: 1 mg via INTRAVENOUS
  Filled 2015-03-31: qty 1

## 2015-03-31 MED ORDER — ONDANSETRON 4 MG PO TBDP: ORAL_TABLET | ORAL | Status: DC

## 2015-03-31 MED ORDER — OXYCODONE-ACETAMINOPHEN 5-325 MG PO TABS: 1.0000 | ORAL_TABLET | ORAL | Status: DC | PRN

## 2015-03-31 MED ORDER — SODIUM CHLORIDE 0.9 % IV BOLUS (SEPSIS)
1000.0000 mL | Freq: Once | INTRAVENOUS | Status: AC
  Administered 2015-03-31: 1000 mL via INTRAVENOUS

## 2015-03-31 MED ORDER — GI COCKTAIL ~~LOC~~
30.0000 mL | Freq: Once | ORAL | Status: AC
  Administered 2015-03-31: 30 mL via ORAL
  Filled 2015-03-31: qty 30

## 2015-03-31 NOTE — Discharge Instructions (Signed)

## 2015-11-23 ENCOUNTER — Encounter (HOSPITAL_COMMUNITY): Payer: Self-pay

## 2015-11-23 ENCOUNTER — Emergency Department (HOSPITAL_COMMUNITY)
Admission: EM | Admit: 2015-11-23 | Discharge: 2015-11-23 | Disposition: A | Discharge status: Home / Self Care | Payer: Medicaid Other | Attending: Emergency Medicine | Admitting: Emergency Medicine

## 2015-11-23 DIAGNOSIS — M329 Systemic lupus erythematosus, unspecified: Secondary | ICD-10-CM | POA: Diagnosis not present

## 2015-11-23 DIAGNOSIS — Z8679 Personal history of other diseases of the circulatory system: Secondary | ICD-10-CM | POA: Diagnosis not present

## 2015-11-23 DIAGNOSIS — F17210 Nicotine dependence, cigarettes, uncomplicated: Secondary | ICD-10-CM | POA: Insufficient documentation

## 2015-11-23 DIAGNOSIS — M25561 Pain in right knee: Secondary | ICD-10-CM | POA: Diagnosis present

## 2015-11-23 DIAGNOSIS — M25572 Pain in left ankle and joints of left foot: Secondary | ICD-10-CM

## 2015-11-23 MED ORDER — PREDNISONE 10 MG (21) PO TBPK: 10.0000 mg | ORAL_TABLET | Freq: Every day | ORAL | Status: DC

## 2015-11-23 MED ORDER — PREDNISONE 20 MG PO TABS
60.0000 mg | ORAL_TABLET | Freq: Once | ORAL | Status: AC
  Administered 2015-11-23: 60 mg via ORAL
  Filled 2015-11-23: qty 3

## 2015-11-23 MED ORDER — OXYCODONE-ACETAMINOPHEN 5-325 MG PO TABS: 1.0000 | ORAL_TABLET | ORAL | Status: DC | PRN

## 2015-11-23 MED ORDER — OXYCODONE-ACETAMINOPHEN 5-325 MG PO TABS
2.0000 | ORAL_TABLET | Freq: Once | ORAL | Status: AC
  Administered 2015-11-23: 2 via ORAL
  Filled 2015-11-23: qty 2

## 2015-11-23 NOTE — ED Provider Notes (Signed)
By signing my name below, I, Randa Evens, attest that this documentation has been prepared under the direction and in the presence of [employer], DO. Electronically Signed: Randa Evens, ED Scribe. 11/23/2015. 3:41 AM.  TIME SEEN: 3:40 AM  CHIEF COMPLAINT: Knee pain, left ankle pain  HPI: HPI Comments: Linda [name] is a 20 y.o. female with history of lupus who presents to the Emergency Department complaining of right knee pain and left ankle pain onset today. Pt doesn't report any associated symptoms. Pt denies injury or trauma.  No calf tenderness or swelling. Pt states she has a Hx of lupus and states that it frequently affects her joints. Denies fever, n/v/d, cp, sob. Pt states that no one follows her for her lupus. States she was previously followed by a rheumatologist at [facility] when she was under the age of 46. She does not have a PCP. Denies Hx of DVT or PE.    ROS: See HPI Constitutional: no fever  Eyes: no drainage  ENT: no runny nose   Cardiovascular:  no chest pain  Resp: no SOB  GI: no vomiting GU: no dysuria Integumentary: no rash  Allergy: no hives  Musculoskeletal: no leg swelling  Neurological: no slurred speech ROS otherwise negative  PAST MEDICAL HISTORY/PAST SURGICAL HISTORY:  Past Medical History  Diagnosis Date  . Lupus ([place])   . Migraines   . Mixed connective tissue disease ([place])     MEDICATIONS:  Prior to Admission medications   Medication Sig Start Date End Date Taking? Authorizing Provider  clindamycin (CLEOCIN) 300 MG capsule Take 1 capsule (300 mg total) by mouth 4 (four) times daily. X 7 days Patient not taking: Reported on 11/23/2015 02/25/15   April Palumbo, MD  meloxicam (MOBIC) 7.5 MG tablet Take 1 tablet (7.5 mg total) by mouth daily. Patient not taking: Reported on 11/23/2015 02/25/15   April Palumbo, MD  mupirocin cream (BACTROBAN) 2 % Apply 1 application topically 2 (two) times daily. Patient not taking: Reported on 11/23/2015  02/25/15   April Palumbo, MD  ondansetron [facility] ODT) 4 MG disintegrating tablet 4mg  ODT q4 hours prn nausea/vomit Patient not taking: Reported on 11/23/2015 03/31/15   Debby Freiberg, MD  oxyCODONE-acetaminophen (PERCOCET/ROXICET) 5-325 MG per tablet Take 1-2 tablets by mouth every 4 (four) hours as needed for severe pain. Patient not taking: Reported on 11/23/2015 03/31/15   Debby Freiberg, MD    ALLERGIES:  No Known Allergies  SOCIAL HISTORY:  Social History  Substance Use Topics  . Smoking status: Current Every Day Smoker -- 0.00 packs/day    Types: Cigarettes, Cigars  . Smokeless tobacco: Not on file  . Alcohol Use: Yes     Comment: social    FAMILY HISTORY: No family history on file.  EXAM: BP 144/104 mmHg  Pulse 94  Temp(Src) 98.1 F (36.7 C) (Oral)  Resp 16  Ht 5\' 6"  (1.676 m)  Wt 225 lb (102.059 kg)  BMI 36.33 kg/m2  SpO2 100% CONSTITUTIONAL: Alert and oriented and responds appropriately to questions. Well-appearing; well-nourished HEAD: Normocephalic EYES: Conjunctivae clear, PERRL ENT: normal nose; no rhinorrhea; moist mucous membranes NECK: Supple, no meningismus, no LAD  CARD: RRR; S1 and S2 appreciated; no murmurs, no clicks, no rubs, no gallops RESP: Normal chest excursion without splinting or tachypnea; breath sounds clear and equal bilaterally; no wheezes, no rhonchi, no rales, no hypoxia or respiratory distress, speaking full sentences ABD/GI: Normal bowel sounds; non-distended; soft, non-tender, no rebound, no guarding, no peritoneal signs  BACK:  The back appears normal and is non-tender to palpation, there is no CVA tenderness EXT: Patient does have some tenderness over the medial right knee joint line without ligamentous laxity. No significant joint effusion. No erythema or warmth. Also tender to palpation over the left anterior ankle without deformity. Also no erythema, warmth or swelling noted at this joint. Sensation-to light touch intact diffusely. 2+ DP  pulses bilaterally. Normal ROM in all joints; otherwise extremities are non-tender to palpation; no edema; normal capillary refill; no cyanosis, no calf tenderness or swelling    SKIN: Normal color for age and race; warm; no rash NEURO: Moves all extremities equally, sensation to light touch intact diffusely, cranial nerves II through XII intact, normal gait PSYCH: The patient's mood and manner are appropriate. Grooming and personal hygiene are appropriate.  MEDICAL DECISION MAKING: Patient here with complaints of right knee pain and left foot pain/ankle pain. No history of injury. States she has had lupus and has had similar symptoms in the past from her lupus. Is not currently on medication for her lupus. No fever, signs of septic arthritis, gout. Doubt DVT as she has no calf tenderness or swelling. Neurovascularly intact distally. We'll discharge with pain medication and steroids. We'll provide her with outpatient follow-up information.   I do not feel there is any life-threatening condition present. I have reviewed and discussed all results (EKG, imaging, lab, urine as appropriate), exam findings with patient. I have reviewed nursing notes and appropriate previous records.  I feel the patient is safe to be discharged home without further emergent workup. Discussed usual and customary return precautions. Patient and family (if present) verbalize understanding and are comfortable with this plan.  Patient will follow-up with their primary care provider. If they do not have a primary care provider, information for follow-up has been provided to them. All questions have been answered.   I personally performed the services described in this documentation, which was scribed in my presence. The recorded information has been reviewed and is accurate.    [place], DO 11/23/15 [phone]

## 2015-11-23 NOTE — ED Notes (Signed)
Pt reports pain and swelling to right knee that she noticed earlier today. She also reports pain to left foot that also started today. Pt ambulatory. Pt unsure of any injury.

## 2015-11-23 NOTE — Discharge Instructions (Signed)
Ankle Pain Ankle pain is a common symptom. The bones, cartilage, tendons, and muscles of the ankle joint perform a lot of work each day. The ankle joint holds your body weight and allows you to move around. Ankle pain can occur on either side or back of 1 or both ankles. Ankle pain may be sharp and burning or dull and aching. There may be tenderness, stiffness, redness, or warmth around the ankle. The pain occurs more often when a person walks or puts pressure on the ankle. CAUSES  There are many reasons ankle pain can develop. It is important to work with your caregiver to identify the cause since many conditions can impact the bones, cartilage, muscles, and tendons. Causes for ankle pain include:  Injury, including a break (fracture), sprain, or strain often due to a fall, sports, or a high-impact activity.  Swelling (inflammation) of a tendon (tendonitis).  Achilles tendon rupture.  Ankle instability after repeated sprains and strains.  Poor foot alignment.  Pressure on a nerve (tarsal tunnel syndrome).  Arthritis in the ankle or the lining of the ankle.  Crystal formation in the ankle (gout or pseudogout). DIAGNOSIS  A diagnosis is based on your medical history, your symptoms, results of your physical exam, and results of diagnostic tests. Diagnostic tests may include X-ray exams or a computerized magnetic scan (magnetic resonance imaging, MRI). TREATMENT  Treatment will depend on the cause of your ankle pain and may include:  Keeping pressure off the ankle and limiting activities.  Using crutches or other walking support (a cane or brace).  Using rest, ice, compression, and elevation.  Participating in physical therapy or home exercises.  Wearing shoe inserts or special shoes.  Losing weight.  Taking medications to reduce pain or swelling or receiving an injection.  Undergoing surgery. HOME CARE INSTRUCTIONS   Only take over-the-counter or prescription medicines for  pain, discomfort, or fever as directed by your caregiver.  Put ice on the injured area.  Put ice in a plastic bag.  Place a towel between your skin and the bag.  Leave the ice on for 15-20 minutes at a time, 03-04 times a day.  Keep your leg raised (elevated) when possible to lessen swelling.  Avoid activities that cause ankle pain.  Follow specific exercises as directed by your caregiver.  Record how often you have ankle pain, the location of the pain, and what it feels like. This information may be helpful to you and your caregiver.  Ask your caregiver about returning to work or sports and whether you should drive.  Follow up with your caregiver for further examination, therapy, or testing as directed. SEEK MEDICAL CARE IF:   Pain or swelling continues or worsens beyond 1 week.  You have an oral temperature above 102 F (38.9 C).  You are feeling unwell or have chills.  You are having an increasingly difficult time with walking.  You have loss of sensation or other new symptoms.  You have questions or concerns. MAKE SURE YOU:   Understand these instructions.  Will watch your condition.  Will get help right away if you are not doing well or get worse.   This information is not intended to replace advice given to you by your health care provider. Make sure you discuss any questions you have with your health care provider.   Document Released: 02/04/2010 Document Revised: 11/09/2011 Document Reviewed: 03/19/2015 Elsevier Interactive Patient Education 2016 Else[employer]  Knee Pain Knee pain is a very common  symptom and can have many causes. Knee pain often goes away when you follow your health care provider's instructions for relieving pain and discomfort at home. However, knee pain can develop into a condition that needs treatment. Some conditions may include:  Arthritis caused by wear and tear (osteoarthritis).  Arthritis caused by swelling and irritation  (rheumatoid arthritis or gout).  A cyst or growth in your knee.  An infection in your knee joint.  An injury that will not heal.  Damage, swelling, or irritation of the tissues that support your knee (torn ligaments or tendinitis). If your knee pain continues, additional tests may be ordered to diagnose your condition. Tests may include X-rays or other imaging studies of your knee. You may also need to have fluid removed from your knee. Treatment for ongoing knee pain depends on the cause, but treatment may include:  Medicines to relieve pain or swelling.  Steroid injections in your knee.  Physical therapy.  Surgery. HOME CARE INSTRUCTIONS  Take medicines only as directed by your health care provider.  Rest your knee and keep it raised (elevated) while you are resting.  Do not do things that cause or worsen pain.  Avoid high-impact activities or exercises, such as running, jumping rope, or doing jumping jacks.  Apply ice to the knee area:  Put ice in a plastic bag.  Place a towel between your skin and the bag.  Leave the ice on for 20 minutes, 2-3 times a day.  Ask your health care provider if you should wear an elastic knee support.  Keep a pillow under your knee when you sleep.  Lose weight if you are overweight. Extra weight can put pressure on your knee.  Do not use any tobacco products, including cigarettes, chewing tobacco, or electronic cigarettes. If you need help quitting, ask your health care provider. Smoking may slow the healing of any bone and joint problems that you may have. SEEK MEDICAL CARE IF:  Your knee pain continues, changes, or gets worse.  You have a fever along with knee pain.  Your knee buckles or locks up.  Your knee becomes more swollen. SEEK IMMEDIATE MEDICAL CARE IF:   Your knee joint feels hot to the touch.  You have chest pain or trouble breathing.   This information is not intended to replace advice given to you by your health  care provider. Make sure you discuss any questions you have with your health care provider.   Document Released: 06/14/2007 Document Revised: 09/07/2014 Document Reviewed: 04/02/2014 Elsevier Interactive Patient Education 2016 Elsevier Inc.   Systemic Lupus Erythematosus, Adult Systemic lupus erythematosus is a long-term (chronic) disease that can affect many parts of the body. It can damage the skin, joints, blood vessels, brain, kidneys, lungs, heart, and other internal organs. It causes pain, irritation, and inflammation. Systemic lupus erythematosus is an autoimmune disease. With this type of disease, the body's defense system (immune system) mistakenly attacks normal tissues instead of attacking germs or abnormal growths. CAUSES The cause of this condition is not known. RISK FACTORS This condition is more likely to develop in:  Females.  People of Asian descent.  People of African-American descent.  People who have a family history of the condition. SYMPTOMS General symptoms include:  Joint pain and swelling (common).  Fever.  Fatigue.  Unusual weight loss or weight gain.  Skin rashes, especially over the nose and cheeks (butterfly rash) and after sun exposure.  Sores inside the mouth or nose. Other symptoms depend  on which parts of the body are affected. They can include:  Shortness of breath.  Chest pain.  Frequent urination.  Blood in the urine.  Seizures.  Mental changes.  Hair loss.  Swollen and tender lymph nodes.  Swelling of the hands or feet. Symptoms can come and go. A period of time when symptoms get worse or come back is called a flare. A period of time with no symptoms is called a remission. DIAGNOSIS This condition is diagnosed based on symptoms, a medical history, and a physical exam. You may also have tests, including:  Blood tests.  Urine tests.  A chest X-ray.  A skin or kidney biopsy. For this test, a sample of tissue is taken  from the skin or kidney and studied under a microscope. You may be referred to an autoimmune disease specialist (rheumatologist). TREATMENT There is no cure for this condition, but treatment can keep the disease in remission, help to control symptoms, and prevent damage to the heart, lungs, kidneys, and other organs. Treatment may involve taking a combination of medicines over time. HOME CARE INSTRUCTIONS Medicines  Take medicines only as directed by your health care provider.  Do not take any medicines that contain estrogen without first checking with your health care provider. Estrogen can trigger flares and may increase your risk for blood clots. Lifestyle  Eat a heart-healthy diet.  Stay active as directed by your health care provider.  Do not smoke. If you need help quitting, ask your health care provider.  Protect your skin from the sun by applying sunblock and wearing protective hats and clothing.  Learn as much as you can about your condition and have a good support system in place. Support may come from family, friends, or a lupus support group. General Instructions  Keep all follow-up visits as directed by your health care provider. This is important.  Work closely with all of your health care providers to manage your condition.  Let your health care provider know right away if you become pregnant or if you plan to become pregnant. Pregnancy in women with this condition is considered high risk. SEEK MEDICAL CARE IF:  You have a fever.  Your symptoms flare.  You develop new symptoms.  You develop swollen feet or hands.  You develop puffiness around your eyes.  Your medicines are not working.  You have bloody, foamy, or coffee-colored urine.  There are changes in your urination. For example, you urinate more often at night.  You think that you may be depressed or have anxiety. SEEK IMMEDIATE MEDICAL CARE IF:  You have chest pain.  You have trouble  breathing.  You have a seizure.  You suddenly get a very bad headache.  You suddenly develop facial or body weakness.  You cannot speak.  You cannot understand speech.   This information is not intended to replace advice given to you by your health care provider. Make sure you discuss any questions you have with your health care provider.   Document Released: 08/07/2002 Document Revised: 01/01/2015 Document Reviewed: 07/25/2014 Elsevier Interactive Patient Education [employer].

## 2015-12-24 ENCOUNTER — Emergency Department (HOSPITAL_COMMUNITY): Payer: Medicaid Other

## 2015-12-24 ENCOUNTER — Emergency Department (HOSPITAL_COMMUNITY)
Admission: EM | Admit: 2015-12-24 | Discharge: 2015-12-24 | Disposition: A | Discharge status: Home / Self Care | Payer: Medicaid Other | Attending: Emergency Medicine | Admitting: Emergency Medicine

## 2015-12-24 ENCOUNTER — Encounter (HOSPITAL_COMMUNITY): Payer: Self-pay | Admitting: Emergency Medicine

## 2015-12-24 DIAGNOSIS — F17210 Nicotine dependence, cigarettes, uncomplicated: Secondary | ICD-10-CM | POA: Diagnosis not present

## 2015-12-24 DIAGNOSIS — R05 Cough: Secondary | ICD-10-CM

## 2015-12-24 DIAGNOSIS — B349 Viral infection, unspecified: Secondary | ICD-10-CM | POA: Insufficient documentation

## 2015-12-24 DIAGNOSIS — Z7952 Long term (current) use of systemic steroids: Secondary | ICD-10-CM | POA: Diagnosis not present

## 2015-12-24 DIAGNOSIS — R059 Cough, unspecified: Secondary | ICD-10-CM

## 2015-12-24 DIAGNOSIS — Z8679 Personal history of other diseases of the circulatory system: Secondary | ICD-10-CM | POA: Insufficient documentation

## 2015-12-24 DIAGNOSIS — Z8739 Personal history of other diseases of the musculoskeletal system and connective tissue: Secondary | ICD-10-CM | POA: Insufficient documentation

## 2015-12-24 MED ORDER — GUAIFENESIN-CODEINE 100-10 MG/5ML PO SOLN
10.0000 mL | Freq: Once | ORAL | Status: AC
  Administered 2015-12-24: 10 mL via ORAL
  Filled 2015-12-24: qty 10

## 2015-12-24 MED ORDER — FEXOFENADINE-PSEUDOEPHED ER 60-120 MG PO TB12
1.0000 | ORAL_TABLET | Freq: Two times a day (BID) | ORAL | Status: DC | PRN
Start: 2015-12-24 — End: 2016-02-11

## 2015-12-24 NOTE — ED Notes (Signed)
Pt to ER with complaint of dry cough and generalized body aches that began yesterday. Pt denies fever. Pt has lupus. A/o x4. NAD. VSS. Reports chest pain worsened with coughing.

## 2015-12-24 NOTE — Discharge Instructions (Signed)

## 2016-01-01 NOTE — ED Provider Notes (Signed)
CSN: [id number]     Arrival date & time 12/24/15  [id number] History   First MD Initiated Contact with Patient 12/24/15 [phone]     Chief Complaint  Patient presents with  . Cough     (Consider location/radiation/quality/duration/timing/severity/associated sxs/prior Treatment) HPI   20 year old female with cough. Onset yesterday. Nonproductive. Mild shortness of breath. No fevers or chills. She feels generally tired and achy.no unusual leg pain or swelling. No sick contacts. Has not tried taking anything for her symptoms.  Past Medical History  Diagnosis Date  . Lupus ([place])   . Migraines   . Mixed connective tissue disease ([place])    History reviewed. No pertinent past surgical history. History reviewed. No pertinent family history. Social History  Substance Use Topics  . Smoking status: Current Every Day Smoker -- 0.00 packs/day    Types: Cigarettes, Cigars  . Smokeless tobacco: None  . Alcohol Use: Yes     Comment: social   OB History    No data available     Review of Systems  All systems reviewed and negative, other than as noted in HPI.   Allergies  Review of patient's allergies indicates no known allergies.  Home Medications   Prior to Admission medications   Medication Sig Start Date End Date Taking? Authorizing Provider  fexofenadine-pseudoephedrine (ALLEGRA-D) 60-120 MG 12 hr tablet Take 1 tablet by mouth 2 (two) times daily as needed (congestion). 12/24/15   Virgel Manifold, MD  oxyCODONE-acetaminophen (PERCOCET/ROXICET) 5-325 MG tablet Take 1 tablet by mouth every 4 (four) hours as needed. 11/23/15   Kristen N Ward, DO  predniSONE (STERAPRED UNI-PAK 21 TAB) 10 MG (21) TBPK tablet Take 1 tablet (10 mg total) by mouth daily. Take as directed 11/23/15   Kristen N Ward, DO   BP 123/88 mmHg  Pulse 66  Temp(Src) 98.2 F (36.8 C)  Resp 16  SpO2 100% Physical Exam  Constitutional: She appears well-developed and well-nourished. No distress.  HENT:  Head: Normocephalic and  atraumatic.  Eyes: Conjunctivae are normal. Right eye exhibits no discharge. Left eye exhibits no discharge.  Neck: Neck supple.  Cardiovascular: Normal rate, regular rhythm and normal heart sounds.  Exam reveals no gallop and no friction rub.   No murmur heard. Pulmonary/Chest: Effort normal. No respiratory distress. She has wheezes.  Faint expiratory wheezing  Abdominal: Soft. She exhibits no distension. There is no tenderness.  Musculoskeletal: She exhibits no edema or tenderness.  Lower extremities symmetric as compared to each other. No calf tenderness. Negative Homan's. No palpable cords.   Neurological: She is alert.  Skin: Skin is warm and dry.  Diffuse chronic appearing skin changes.  Psychiatric: She has a normal mood and affect. Her behavior is normal. Thought content normal.  Nursing note and vitals reviewed.   ED Course  Procedures (including critical care time) Labs Review Labs Reviewed - No data to display  Imaging Review No results found.   Dg Chest 2 View  12/24/2015  CLINICAL DATA:  Cough for 2 days EXAM: CHEST  2 VIEW COMPARISON:  10/22/2013 FINDINGS: Normal heart size. Lungs clear. No pneumothorax. No pleural effusion. IMPRESSION: No active cardiopulmonary disease. Electronically Signed   By: Marybelle Killings M.D.   On: 12/24/2015 08:14   I have personally reviewed and evaluated these images and lab results as part of my medical decision-making.   EKG Interpretation None      MDM   Final diagnoses:  Cough  Viral illness    20 year old female with  what I suspect is from her bronchitis. Afebrile. Well-appearing. Chest x-ray is without acute abnormality. Plan symptomatic treatment. Low suspicion for emergent process. Return precautions were discussed. Outpatient follow-up as needed otherwise.    Virgel Manifold, MD 01/01/16 1302

## 2016-01-03 ENCOUNTER — Encounter (HOSPITAL_COMMUNITY): Payer: Self-pay

## 2016-01-03 ENCOUNTER — Emergency Department (HOSPITAL_COMMUNITY)
Admission: EM | Admit: 2016-01-03 | Discharge: 2016-01-03 | Disposition: A | Discharge status: Home / Self Care | Payer: Medicaid Other | Attending: Emergency Medicine | Admitting: Emergency Medicine

## 2016-01-03 DIAGNOSIS — I10 Essential (primary) hypertension: Secondary | ICD-10-CM | POA: Diagnosis not present

## 2016-01-03 DIAGNOSIS — R350 Frequency of micturition: Secondary | ICD-10-CM | POA: Diagnosis not present

## 2016-01-03 DIAGNOSIS — M545 Low back pain: Secondary | ICD-10-CM | POA: Insufficient documentation

## 2016-01-03 DIAGNOSIS — F17210 Nicotine dependence, cigarettes, uncomplicated: Secondary | ICD-10-CM | POA: Diagnosis not present

## 2016-01-03 NOTE — ED Notes (Signed)
Patient called to treatment room x1, no answer.

## 2016-01-03 NOTE — ED Notes (Addendum)
Pt c/o 10/10 lower back non-traumatic, no radiation for the past 8hrs. Pt reports increased urinary frequency. Pt denies dysuria. Pt tearful in triage stating she has been taking Oxycodone at home w/ no pain relief. Pt arrives A+OX4, speaking in complete sentences, ambulatory to triage.

## 2016-01-03 NOTE — ED Notes (Signed)
Patient called to treatment room, second call, no answer.

## 2016-01-03 NOTE — ED Notes (Signed)
Patient called 3rd time, no answer. Patient D/C LWBS at this time.

## 2016-01-26 ENCOUNTER — Encounter (HOSPITAL_COMMUNITY): Payer: Self-pay | Admitting: *Deleted

## 2016-01-26 ENCOUNTER — Emergency Department (HOSPITAL_COMMUNITY)
Admission: EM | Admit: 2016-01-26 | Discharge: 2016-01-26 | Disposition: A | Discharge status: Home / Self Care | Payer: Medicaid Other | Attending: Emergency Medicine | Admitting: Emergency Medicine

## 2016-01-26 DIAGNOSIS — L03115 Cellulitis of right lower limb: Secondary | ICD-10-CM | POA: Diagnosis not present

## 2016-01-26 DIAGNOSIS — F17210 Nicotine dependence, cigarettes, uncomplicated: Secondary | ICD-10-CM | POA: Insufficient documentation

## 2016-01-26 DIAGNOSIS — M25561 Pain in right knee: Secondary | ICD-10-CM | POA: Diagnosis present

## 2016-01-26 DIAGNOSIS — Z8679 Personal history of other diseases of the circulatory system: Secondary | ICD-10-CM | POA: Diagnosis not present

## 2016-01-26 DIAGNOSIS — Z7952 Long term (current) use of systemic steroids: Secondary | ICD-10-CM | POA: Diagnosis not present

## 2016-01-26 MED ORDER — DOXYCYCLINE HYCLATE 100 MG PO CAPS: 100.0000 mg | ORAL_CAPSULE | Freq: Two times a day (BID) | ORAL | Status: DC

## 2016-01-26 MED ORDER — DOXYCYCLINE HYCLATE 100 MG PO TABS
100.0000 mg | ORAL_TABLET | Freq: Once | ORAL | Status: AC
  Administered 2016-01-26: 100 mg via ORAL
  Filled 2016-01-26: qty 1

## 2016-01-26 MED ORDER — HYDROCODONE-ACETAMINOPHEN 5-325 MG PO TABS
2.0000 | ORAL_TABLET | Freq: Once | ORAL | Status: AC
  Administered 2016-01-26: 2 via ORAL
  Filled 2016-01-26: qty 2

## 2016-01-26 MED ORDER — HYDROCODONE-ACETAMINOPHEN 5-325 MG PO TABS: 1.0000 | ORAL_TABLET | Freq: Four times a day (QID) | ORAL | Status: DC | PRN

## 2016-01-26 NOTE — Discharge Instructions (Signed)

## 2016-01-26 NOTE — ED Notes (Signed)
Patient stated she started with pain to the right knee last night and today the swelling was worse.  Stated happened before and it was her Lupus she thinks

## 2016-01-26 NOTE — ED Notes (Signed)
EDP at bedside  

## 2016-01-26 NOTE — ED Provider Notes (Signed)
CSN: [id number]     Arrival date & time 01/26/16  2041 History  By signing my name below, I, Meriel Flavors, attest that this documentation has been prepared under the direction and in the presence of Montine Circle PA-C  Electronically Signed: Meriel Flavors, ED Scribe. 01/26/2016. 4:01 PM.   Chief Complaint  Patient presents with  . Knee Pain   HPI HPI Comments: Linda [name] is a 20 y.o. female with a PMHx of lupus who presents to the Emergency Department complaining of sudden onset, gradually worsening, right knee pain that began last night. Pt reports she first noticed a small knot that has gotten worse. Pt also reports associated swelling. Patient is not diabetic. Denies any associated fevers chills. Pt denies any recent bug bites or scratches.    Past Medical History  Diagnosis Date  . Lupus ([place])   . Migraines   . Mixed connective tissue disease [facility])    Past Surgical History  Procedure Laterality Date  . Multiple tooth extractions     No family history on file. Social History  Substance Use Topics  . Smoking status: Current Every Day Smoker -- 0.00 packs/day    Types: Cigarettes, Cigars  . Smokeless tobacco: Never Used  . Alcohol Use: Yes     Comment: social   OB History    No data available     Review of Systems  Constitutional: Negative for fever and chills.  Musculoskeletal: Positive for joint swelling (R knee) and arthralgias (R knee).      Allergies  Review of patient's allergies indicates no known allergies.  Home Medications   Prior to Admission medications   Medication Sig Start Date End Date Taking? Authorizing Provider  fexofenadine-pseudoephedrine (ALLEGRA-D) 60-120 MG 12 hr tablet Take 1 tablet by mouth 2 (two) times daily as needed (congestion). 12/24/15   Virgel Manifold, MD  oxyCODONE-acetaminophen (PERCOCET/ROXICET) 5-325 MG tablet Take 1 tablet by mouth every 4 (four) hours as needed. 11/23/15   Kristen N Ward, DO  predniSONE (STERAPRED UNI-PAK  21 TAB) 10 MG (21) TBPK tablet Take 1 tablet (10 mg total) by mouth daily. Take as directed 11/23/15   Kristen N Ward, DO   BP 136/80 mmHg  Pulse 101  Temp(Src) 99.1 F (37.3 C) (Oral)  Resp 16  Ht 5\' 6"  (1.676 m)  Wt 225 lb (102.059 kg)  BMI 36.33 kg/m2  SpO2 100% Physical Exam  Constitutional: She is oriented to person, place, and time. She appears well-developed and well-nourished. No distress.  HENT:  Head: Normocephalic and atraumatic.  Eyes: Conjunctivae and EOM are normal.  Neck: Normal range of motion.  Cardiovascular: Normal rate and intact distal pulses.   Pulmonary/Chest: Effort normal.  Abdominal: She exhibits no distension.  Musculoskeletal: Normal range of motion.  Neurological: She is alert and oriented to person, place, and time.  Skin: Skin is warm and dry. She is not diaphoretic.  4 x 4 centimeter circular area of cellulitis to right medial thigh with mild swelling, no streaking, no fluctuance, no discharge or drainage  Psychiatric: She has a normal mood and affect. Her behavior is normal. Judgment and thought content normal.  Nursing note and vitals reviewed.   ED Course  Procedures  DIAGNOSTIC STUDIES: Oxygen Saturation is 100% on RA, normal by my interpretation.  COORDINATION OF CARE: 9:09 PM Discussed treatment plan which includes [place] with pt at bedside and pt agreed to plan.  EMERGENCY DEPARTMENT US SOFT TISSUE INTERPRETATION "Study: Limited Ultrasound of the noted  body part in comments below"  INDICATIONS: Pain, soft tissue infection Multiple views of the body part are obtained with a multi-frequency linear probe  PERFORMED BY:  Myself  IMAGES ARCHIVED?: Yes  SIDE:Right   BODY PART:Lower extremity  FINDINGS: No abcess noted  LIMITATIONS:  Body Habitus  INTERPRETATION:  No abcess noted  COMMENT:  Distal right medial thigh    MDM   Final diagnoses:  Cellulitis of right lower extremity    Patient with cellulitis of right distal  thigh. Patient seen by and discussed with Dr. Billy Fischer, who agrees with treatment plan for antibiotics pain medicine. No abscess is seen on my ultrasound exam. Dr. Billy Fischer also examined the patient with ultrasound, and did not view any abscess. Strict return precautions given. Patient encouraged follow-up in 2 days for wound recheck. Patient understands agrees the plan. She is stable and ready for discharge.   I personally performed the services described in this documentation, which was scribed in my presence. The recorded information has been reviewed and is accurate.       Montine Circle, PA-C 01/26/16 2151  Gareth Morgan, MD 01/29/16 [id number]

## 2016-01-26 NOTE — ED Notes (Signed)
See EDP assessment 

## 2016-01-28 ENCOUNTER — Encounter (HOSPITAL_COMMUNITY): Payer: Self-pay | Admitting: Emergency Medicine

## 2016-01-28 ENCOUNTER — Emergency Department (HOSPITAL_COMMUNITY)
Admission: EM | Admit: 2016-01-28 | Discharge: 2016-01-28 | Disposition: A | Discharge status: Home / Self Care | Payer: Medicaid Other | Attending: Emergency Medicine | Admitting: Emergency Medicine

## 2016-01-28 DIAGNOSIS — L02416 Cutaneous abscess of left lower limb: Secondary | ICD-10-CM | POA: Diagnosis not present

## 2016-01-28 DIAGNOSIS — L03116 Cellulitis of left lower limb: Secondary | ICD-10-CM | POA: Diagnosis not present

## 2016-01-28 DIAGNOSIS — Z7952 Long term (current) use of systemic steroids: Secondary | ICD-10-CM | POA: Insufficient documentation

## 2016-01-28 DIAGNOSIS — M79651 Pain in right thigh: Secondary | ICD-10-CM | POA: Diagnosis present

## 2016-01-28 DIAGNOSIS — F17210 Nicotine dependence, cigarettes, uncomplicated: Secondary | ICD-10-CM | POA: Diagnosis not present

## 2016-01-28 DIAGNOSIS — Z792 Long term (current) use of antibiotics: Secondary | ICD-10-CM | POA: Insufficient documentation

## 2016-01-28 DIAGNOSIS — Z8679 Personal history of other diseases of the circulatory system: Secondary | ICD-10-CM | POA: Diagnosis not present

## 2016-01-28 MED ORDER — LIDOCAINE HCL 2 % IJ SOLN: 10.0000 mL | Freq: Once | INTRAMUSCULAR | Status: DC

## 2016-01-28 MED ORDER — SULFAMETHOXAZOLE-TRIMETHOPRIM 800-160 MG PO TABS
1.0000 | ORAL_TABLET | Freq: Once | ORAL | Status: AC
  Administered 2016-01-28: 1 via ORAL
  Filled 2016-01-28: qty 1

## 2016-01-28 MED ORDER — CEPHALEXIN 250 MG PO CAPS
500.0000 mg | ORAL_CAPSULE | Freq: Once | ORAL | Status: AC
  Administered 2016-01-28: 500 mg via ORAL
  Filled 2016-01-28: qty 2

## 2016-01-28 MED ORDER — HYDROMORPHONE HCL 1 MG/ML IJ SOLN: 1.0000 mg | Freq: Once | INTRAMUSCULAR | Status: DC

## 2016-01-28 MED ORDER — LIDOCAINE HCL (PF) 1 % IJ SOLN
INTRAMUSCULAR | Status: AC
  Filled 2016-01-28: qty 30

## 2016-01-28 MED ORDER — CEPHALEXIN 500 MG PO CAPS: 500.0000 mg | ORAL_CAPSULE | Freq: Three times a day (TID) | ORAL | Status: DC

## 2016-01-28 MED ORDER — OXYCODONE-ACETAMINOPHEN 5-325 MG PO TABS: 1.0000 | ORAL_TABLET | ORAL | Status: DC | PRN

## 2016-01-28 MED ORDER — FLUCONAZOLE 150 MG PO TABS: 150.0000 mg | ORAL_TABLET | Freq: Every day | ORAL | Status: AC

## 2016-01-28 MED ORDER — SULFAMETHOXAZOLE-TRIMETHOPRIM 800-160 MG PO TABS: 1.0000 | ORAL_TABLET | Freq: Two times a day (BID) | ORAL | Status: AC

## 2016-01-28 MED ORDER — HYDROMORPHONE HCL 1 MG/ML IJ SOLN
1.0000 mg | Freq: Once | INTRAMUSCULAR | Status: AC
  Administered 2016-01-28: 1 mg via INTRAMUSCULAR
  Filled 2016-01-28: qty 1

## 2016-01-28 NOTE — ED Notes (Signed)
Pt to ED with c/o right leg pain/swelling, worsening since being seen in ED for same on Sunday. Now reports n/v/d, as well. Pt with knee brace in place to right leg..."it hurts to bend my leg".

## 2016-01-28 NOTE — ED Provider Notes (Signed)
CSN: [id number]     Arrival date & time 01/28/16  2046 History   First MD Initiated Contact with Patient 01/28/16 2059     Chief Complaint  Patient presents with  . Leg Pain  . Leg Swelling     (Consider location/radiation/quality/duration/timing/severity/associated sxs/prior Treatment) HPI Linda [name] is a 20 y.o. female who presents to emergency department complaining of worsening right thigh pain. Patient was seen here 2 days ago, was diagnosed with cellulitis of the right thigh. She is placed on doxycycline. She states that since then she feels like the area has gotten more painful and larger. She denies any fever or chills. She denies any joint pain. She states she is able to move her knee but states it is painful. He has been taking doxycycline as prescribed. She reports history of lupus but does not have a primary care doctor and currently not on any medications for it. She denies any other associated systemic symptoms.  Past Medical History  Diagnosis Date  . Lupus ([place])   . Migraines   . Mixed connective tissue disease [facility])    Past Surgical History  Procedure Laterality Date  . Multiple tooth extractions     History reviewed. No pertinent family history. Social History  Substance Use Topics  . Smoking status: Current Every Day Smoker -- 0.00 packs/day    Types: Cigarettes, Cigars  . Smokeless tobacco: Never Used  . Alcohol Use: Yes     Comment: social   OB History    Gravida Para Term Preterm AB TAB SAB Ectopic Multiple Living   0 0 0 0 0 0 0 0 0 0      Review of Systems  Constitutional: Negative for fever and chills.  Respiratory: Negative for cough, chest tightness and shortness of breath.   Cardiovascular: Negative for chest pain, palpitations and leg swelling.  Gastrointestinal: Negative for nausea, vomiting, abdominal pain and diarrhea.  Musculoskeletal: Positive for myalgias. Negative for neck pain and neck stiffness.  Skin: Positive for color change  and wound. Negative for rash.  Neurological: Negative for dizziness, weakness and headaches.  All other systems reviewed and are negative.     Allergies  Review of patient's allergies indicates no known allergies.  Home Medications   Prior to Admission medications   Medication Sig Start Date End Date Taking? Authorizing Provider  doxycycline (VIBRAMYCIN) 100 MG capsule Take 1 capsule (100 mg total) by mouth 2 (two) times daily. 01/26/16   Montine Circle, PA-C  fexofenadine-pseudoephedrine (ALLEGRA-D) 60-120 MG 12 hr tablet Take 1 tablet by mouth 2 (two) times daily as needed (congestion). 12/24/15   Virgel Manifold, MD  HYDROcodone-acetaminophen (NORCO/VICODIN) 5-325 MG tablet Take 1-2 tablets by mouth every 6 (six) hours as needed. 01/26/16   Montine Circle, PA-C  oxyCODONE-acetaminophen (PERCOCET/ROXICET) 5-325 MG tablet Take 1 tablet by mouth every 4 (four) hours as needed. 11/23/15   Kristen N Ward, DO  predniSONE (STERAPRED UNI-PAK 21 TAB) 10 MG (21) TBPK tablet Take 1 tablet (10 mg total) by mouth daily. Take as directed 11/23/15   Kristen N Ward, DO   BP 152/110 mmHg  Pulse 101  Temp(Src) 98.3 F (36.8 C)  Resp 16  SpO2 96% Physical Exam  Constitutional: She is oriented to person, place, and time. She appears well-developed and well-nourished. No distress.  Eyes: Conjunctivae are normal.  Neck: Neck supple.  Cardiovascular: Normal rate, regular rhythm and normal heart sounds.   Pulmonary/Chest: Effort normal and breath sounds normal. No respiratory distress.  She has no wheezes. She has no rales.  Musculoskeletal:  Approximately 8 x 8 cm area of erythema, induration, warmth to touch over the left anterior medial thigh just above the knee. Full range of motion of the knee joint.  Neurological: She is alert and oriented to person, place, and time.  Skin: Skin is warm and dry.  Nursing note and vitals reviewed.   ED Course  Procedures (including critical care time) Labs  Review Labs Reviewed - No data to display  Imaging Review No results found. I have personally reviewed and evaluated these images and lab results as part of my medical decision-making.   EKG Interpretation None     INCISION AND DRAINAGE Performed by: Jeannett Senior A Consent: Verbal consent obtained. Risks and benefits: risks, benefits and alternatives were discussed Type: abscess  Body area: Right medial thigh  Anesthesia: local infiltration  Incision was made with a scalpel.  Local anesthetic: lidocaine 1% wo epinephrine  Anesthetic total: 4 ml  Complexity: complex Blunt dissection to break up loculations  Drainage: purulent  Drainage amount: Small   Packing material: 1/4 in iodoform gauze  Patient tolerance: Patient tolerated the procedure well with no immediate complications.    MDM   Final diagnoses:  Abscess of left thigh    Patient with an abscess and cellulitis to the right medial thigh, seen on a bedside ultrasound. Confirm with Dr. Ralene Bathe. Sized the drain with small amount of pus. Packed. Home with Keflex and Bactrim. Warm compresses. Follow-up in 2 days. She is afebrile, otherwise nontoxic appearing. No systemic symptoms. Do not think she needs admission IV antibiotics at this time.  Filed Vitals:   01/28/16 2107 01/28/16 2109  BP: 152/110   Pulse: 101   Temp: 98.3 F (36.8 C)   Resp: 16   SpO2: 96% 96%       Jeannett Senior, PA-C 01/28/16 [place], MD 01/29/16 0028

## 2016-01-28 NOTE — Discharge Instructions (Signed)
Take bactrim and keflex as prescribed until all gone. Pain medication as needed. Warm compresses or soaks. Follow up with primary care doctor or return here in two days.    Abscess An abscess is an infected area that contains a collection of pus and debris.It can occur in almost any part of the body. An abscess is also known as a furuncle or boil. CAUSES  An abscess occurs when tissue gets infected. This can occur from blockage of oil or sweat glands, infection of hair follicles, or a minor injury to the skin. As the body tries to fight the infection, pus collects in the area and creates pressure under the skin. This pressure causes pain. People with weakened immune systems have difficulty fighting infections and get certain abscesses more often.  SYMPTOMS Usually an abscess develops on the skin and becomes a painful mass that is red, warm, and tender. If the abscess forms under the skin, you may feel a moveable soft area under the skin. Some abscesses break open (rupture) on their own, but most will continue to get worse without care. The infection can spread deeper into the body and eventually into the bloodstream, causing you to feel ill.  DIAGNOSIS  Your caregiver will take your medical history and perform a physical exam. A sample of fluid may also be taken from the abscess to determine what is causing your infection. TREATMENT  Your caregiver may prescribe antibiotic medicines to fight the infection. However, taking antibiotics alone usually does not cure an abscess. Your caregiver may need to make a small cut (incision) in the abscess to drain the pus. In some cases, gauze is packed into the abscess to reduce pain and to continue draining the area. HOME CARE INSTRUCTIONS   Only take over-the-counter or prescription medicines for pain, discomfort, or fever as directed by your caregiver.  If you were prescribed antibiotics, take them as directed. Finish them even if you start to feel  better.  If gauze is used, follow your caregiver's directions for changing the gauze.  To avoid spreading the infection:  Keep your draining abscess covered with a bandage.  Wash your hands well.  Do not share personal care items, towels, or whirlpools with others.  Avoid skin contact with others.  Keep your skin and clothes clean around the abscess.  Keep all follow-up appointments as directed by your caregiver. SEEK MEDICAL CARE IF:   You have increased pain, swelling, redness, fluid drainage, or bleeding.  You have muscle aches, chills, or a general ill feeling.  You have a fever. MAKE SURE YOU:   Understand these instructions.  Will watch your condition.  Will get help right away if you are not doing well or get worse.   This information is not intended to replace advice given to you by your health care provider. Make sure you discuss any questions you have with your health care provider.   Document Released: 05/27/2005 Document Revised: 02/16/2012 Document Reviewed: 10/30/2011 Elsevier Interactive Patient Education [employer].

## 2016-01-30 ENCOUNTER — Ambulatory Visit: Payer: Medicaid Other | Admitting: Obstetrics

## 2016-01-31 ENCOUNTER — Ambulatory Visit: Payer: Medicaid Other | Admitting: Internal Medicine

## 2016-02-11 ENCOUNTER — Ambulatory Visit (INDEPENDENT_AMBULATORY_CARE_PROVIDER_SITE_OTHER): Payer: Medicaid Other | Admitting: Internal Medicine

## 2016-02-11 ENCOUNTER — Encounter: Payer: Self-pay | Admitting: Internal Medicine

## 2016-02-11 VITALS — BP 128/78 | HR 85 | Temp 98.0°F | Ht 66.0 in | Wt 209.6 lb

## 2016-02-11 DIAGNOSIS — M351 Other overlap syndromes: Secondary | ICD-10-CM | POA: Insufficient documentation

## 2016-02-11 NOTE — Patient Instructions (Signed)
Can get over the counter Debrox to clear wax out of the ears.

## 2016-02-11 NOTE — Progress Notes (Signed)
   [name] Kerrin Mo, MD Phone: [phone]  Reason For Visit: Establish Care   # No issues to discuss at this visit  # Hx mixed connective tissue disease - Last seen March 30, 2015 at [facility]. Patient was previously taking chloroquine. She has not followed up with [facility] since that appointment as she was planning on transitioning to any adult rheumatology clinic. Patient has not been taking any medication for UCTD in about a year. However is interested in following up with Rheum otology. Denis any current skin lesions at this time, last lesion was at the beginning of May, was treated for cellulitis   General  - Lives with boyfriend, Sexually active, Nexplanon in place, No condom use at this time. Feels safe in relationship   - Recently quite job, planning on going back to school. [employer] to study - possibly nursing - Smokes Black and Mild cigars - smokes 1 cigar daily, denies any ETOH or drug uses  Past Medical History Reviewed problem list.  Medications- reviewed and updated No additions to family history Social history- patient is a  smoker  Objective: There were no vitals taken for this visit. Gen: NAD, alert, cooperative with exam HEENT: NCAT, PERRL, TMs nml Neck: FROM, supple, no lymphadenopathy, no thyromegaly  CV: RRR, good S1/S2, no murmur, Resp: CTABL, no wheezes, non-labored Abd: SNTND, BS present, no guarding or organomegaly Ext: No edema, warm, normal tone, moves UE/LE spontaneously Skin: Face with small scars throughout, hyperpigmentation on both legs due previous ulcerations, no open ulcerations currently.   Assessment/Plan: See problem based a/p  Mixed connective tissue disease [facility]) - Last Rheumatology visit in July 2016. Patient not currently on any immunosuppressant therapies, however she has been in the past. States she has not taken any medications in about a year. PE no currently skin lesions, scarring and hyperpigmentation   for previous skin lesions.  - Discussed follow up rheumatology, patient agreeable, referral placed.

## 2016-02-11 NOTE — Assessment & Plan Note (Signed)
-   Last Rheumatology visit in July 2016. Patient not currently on any immunosuppressant therapies, however she has been in the past. States she has not taken any medications in about a year. PE no currently skin lesions, scarring and hyperpigmentation  for previous skin lesions.  - Discussed follow up rheumatology, patient agreeable, referral placed.

## 2016-02-19 ENCOUNTER — Telehealth: Payer: Self-pay

## 2016-02-19 NOTE — Telephone Encounter (Signed)
LMOVM. Dr. Estanislado Pandy, rheumatologist, has declined patient's referral. No other rheum in [facility] that accepts Medicaid. Need to know if patient would like to travel to [place] or [place]. Please let me know what she says.

## 2016-02-20 NOTE — Telephone Encounter (Signed)
Patient chose [facility], will send referral new week.

## 2016-05-18 ENCOUNTER — Ambulatory Visit (INDEPENDENT_AMBULATORY_CARE_PROVIDER_SITE_OTHER): Payer: Medicaid Other | Admitting: Internal Medicine

## 2016-05-18 VITALS — BP 142/103 | HR 92 | Temp 98.5°F | Ht 66.0 in | Wt 212.6 lb

## 2016-05-18 DIAGNOSIS — M351 Other overlap syndromes: Secondary | ICD-10-CM | POA: Diagnosis present

## 2016-05-18 LAB — POCT SEDIMENTATION RATE: POCT SED RATE: 33 mm/hr — AB (ref 0–22)

## 2016-05-18 MED ORDER — PREDNISONE 20 MG PO TABS: ORAL_TABLET | ORAL | 0 refills | Status: DC

## 2016-05-18 NOTE — Progress Notes (Signed)
   [facility] Kerrin Mo, MD Phone: [phone]  Reason For Visit: Same day for sore on stomach   # Patient with pmhx significant for mixed connective tissues disease presenting with a sore on her stomach. She states these are lesions she gets when she has an exacerbation of autoimmune disease. She has had this lesion for about 1 week. She has been placing dressing on it and cleaning it twice daily. It is very painful to the touch. In the past when she has had exacerbations she thinks she received steroids. No swelling, pus, or redness around the site. No fevers, chills, nausea or vomiting. Patient has been not be able to see a Rheumatologist, after our last visit patient lost her phone for a while and therefore thinks she missed the referral.    Past Medical History Reviewed problem list.  Medications- reviewed and updated No additions to family history Social history- patient is a non- smoker  Objective: BP (!) 142/103 (BP Location: Right Arm, Patient Position: Sitting, Cuff Size: Normal)   Pulse 92   Temp 98.5 F (36.9 C) (Oral)   Ht '5\' 6"'$  (1.676 m)   Wt 212 lb 9.6 oz (96.4 kg)   LMP 04/30/2016 (Approximate)   SpO2 96%   BMI 34.31 kg/m  Gen: Young women with multiple scares and areas of hyperpigmentation all over her bodyf from mixed connective tissues diease, NAD, alert, cooperative with exam Skin: 1 cm circular lesion noted on the abdomen, no surround erythema or swelling, not warm to the touch.    Assessment/Plan: See problem based a/p  Mixed connective tissue disease ([place]) Mixed connective tissues disease. ESR slightly elevated today consistent with possibly exacerbation.  - Provide patient with 14 days of prednisone - with a taper  - Resent Rheumatology referral, patient to follow up if she does not receive call for this in the next couple of weeks  - Discussed return precautions regarding any signs of infection  - Provide gauze and bacitracin  ointment for dressing changes  - Discussed with Dr. Fortunato Curling

## 2016-05-18 NOTE — Patient Instructions (Signed)
Please take prednisone as directed. I will put another referral to Rheumatology. Please call me back to discuss any issues with this. Please return if you note any of the below   GET HELP IF:  You have redness, swelling, or pain that gets worse.   You have a general feeling of sickness (malaise).   You feel sick to your stomach (nauseous).  You throw up (vomit).   You have pain that does not get better.  GET HELP RIGHT AWAY IF:   You have a red streak going away from your wound.   You have fluid, blood, or pus coming from your wound.   You have a fever or chills.   You have trouble moving your injured area.   You have numbness or tingling anywhere on your body.    This information is not intended to replace advice given to you by your health care provider. Make sure you discuss any questions you have with your health care provider.   Document Released: 08/17/2005 Document Revised: 05/08/2015 Document Reviewed: 01/02/2015 Elsevier Interactive Patient Education [employer].  as prescribed.

## 2016-05-21 NOTE — Assessment & Plan Note (Addendum)
Mixed connective tissues disease. ESR slightly elevated today consistent with possibly exacerbation.  - Provide patient with 14 days of prednisone - with a taper  - Resent Rheumatology referral, patient to follow up if she does not receive call for this in the next couple of weeks  - Discussed return precautions regarding any signs of infection  - Provide gauze and bacitracin ointment for dressing changes  - Discussed with Dr. Fortunato Curling

## 2016-05-26 ENCOUNTER — Encounter: Payer: Self-pay | Admitting: Student

## 2016-05-26 NOTE — Progress Notes (Signed)
ESR mildly elevated likely due to flareup of a connective tissue disease. Already on prednisone taper per note from last visit is with Dr. Emmaline Life. Advised her to continue taking the prednisone.

## 2016-06-14 ENCOUNTER — Emergency Department (HOSPITAL_COMMUNITY)
Admission: EM | Admit: 2016-06-14 | Discharge: 2016-06-14 | Disposition: A | Discharge status: Home / Self Care | Payer: Medicaid Other | Attending: Emergency Medicine | Admitting: Emergency Medicine

## 2016-06-14 ENCOUNTER — Encounter (HOSPITAL_COMMUNITY): Payer: Self-pay | Admitting: Emergency Medicine

## 2016-06-14 DIAGNOSIS — L03116 Cellulitis of left lower limb: Secondary | ICD-10-CM | POA: Diagnosis not present

## 2016-06-14 DIAGNOSIS — L089 Local infection of the skin and subcutaneous tissue, unspecified: Secondary | ICD-10-CM | POA: Diagnosis present

## 2016-06-14 DIAGNOSIS — F17210 Nicotine dependence, cigarettes, uncomplicated: Secondary | ICD-10-CM | POA: Insufficient documentation

## 2016-06-14 DIAGNOSIS — I10 Essential (primary) hypertension: Secondary | ICD-10-CM | POA: Diagnosis not present

## 2016-06-14 DIAGNOSIS — L03119 Cellulitis of unspecified part of limb: Secondary | ICD-10-CM

## 2016-06-14 LAB — CBC WITH DIFFERENTIAL/PLATELET
Basophils Absolute: 0 10*3/uL (ref 0.0–0.1)
Basophils Relative: 0 %
Eosinophils Absolute: 0.4 10*3/uL (ref 0.0–0.7)
Eosinophils Relative: 5 %
HCT: 39.1 % (ref 36.0–46.0)
Hemoglobin: 12.7 g/dL (ref 12.0–15.0)
Lymphocytes Relative: 35 %
Lymphs Abs: 2.9 10*3/uL (ref 0.7–4.0)
MCH: 30.5 pg (ref 26.0–34.0)
MCHC: 32.5 g/dL (ref 30.0–36.0)
MCV: 93.8 fL (ref 78.0–100.0)
Monocytes Absolute: 0.9 10*3/uL (ref 0.1–1.0)
Monocytes Relative: 11 %
Neutro Abs: 4 10*3/uL (ref 1.7–7.7)
Neutrophils Relative %: 49 %
Platelets: 223 10*3/uL (ref 150–400)
RBC: 4.17 MIL/uL (ref 3.87–5.11)
RDW: 12.7 % (ref 11.5–15.5)
WBC: 8.3 10*3/uL (ref 4.0–10.5)

## 2016-06-14 LAB — COMPREHENSIVE METABOLIC PANEL
ALT: 10 U/L — ABNORMAL LOW (ref 14–54)
AST: 23 U/L (ref 15–41)
Albumin: 3.5 g/dL (ref 3.5–5.0)
Alkaline Phosphatase: 58 U/L (ref 38–126)
Anion gap: 7 (ref 5–15)
BUN: 6 mg/dL (ref 6–20)
CO2: 24 mmol/L (ref 22–32)
Calcium: 9.3 mg/dL (ref 8.9–10.3)
Chloride: 107 mmol/L (ref 101–111)
Creatinine, Ser: 0.63 mg/dL (ref 0.44–1.00)
GFR calc Af Amer: >60 mL/min (ref >60)
GFR calc non Af Amer: >60 mL/min (ref >60)
Glucose, Bld: 90 mg/dL (ref 65–99)
Potassium: 4 mmol/L (ref 3.5–5.1)
Sodium: 138 mmol/L (ref 135–145)
Total Bilirubin: 0.9 mg/dL (ref 0.3–1.2)
Total Protein: 7.4 g/dL (ref 6.5–8.1)

## 2016-06-14 LAB — I-STAT BETA HCG BLOOD, ED (MC, WL, AP ONLY): I-stat hCG, quantitative: <5 m[IU]/mL (ref <5)

## 2016-06-14 LAB — I-STAT CG4 LACTIC ACID, ED: Lactic Acid, Venous: 1.13 mmol/L (ref 0.5–1.9)

## 2016-06-14 MED ORDER — LISINOPRIL 10 MG PO TABS
10.0000 mg | ORAL_TABLET | Freq: Once | ORAL | Status: AC
  Administered 2016-06-14: 10 mg via ORAL
  Filled 2016-06-14: qty 1

## 2016-06-14 MED ORDER — CLINDAMYCIN HCL 300 MG PO CAPS: 300.0000 mg | ORAL_CAPSULE | Freq: Four times a day (QID) | ORAL | 0 refills | Status: DC

## 2016-06-14 MED ORDER — OXYCODONE-ACETAMINOPHEN 5-325 MG PO TABS: 1.0000 | ORAL_TABLET | Freq: Four times a day (QID) | ORAL | 0 refills | Status: DC | PRN

## 2016-06-14 MED ORDER — MORPHINE SULFATE (PF) 4 MG/ML IV SOLN
4.0000 mg | Freq: Once | INTRAVENOUS | Status: AC
  Administered 2016-06-14: 4 mg via INTRAVENOUS
  Filled 2016-06-14: qty 1

## 2016-06-14 MED ORDER — LISINOPRIL 10 MG PO TABS: 10.0000 mg | ORAL_TABLET | Freq: Every day | ORAL | 0 refills | Status: DC

## 2016-06-14 MED ORDER — CLINDAMYCIN PHOSPHATE 600 MG/50ML IV SOLN
600.0000 mg | Freq: Once | INTRAVENOUS | Status: AC
  Administered 2016-06-14: 600 mg via INTRAVENOUS
  Filled 2016-06-14: qty 50

## 2016-06-14 NOTE — ED Triage Notes (Signed)
Pt states she has a history of lupus and has had a sore on her right lower calf for over 1 week and the last 3 days she has noticed redness and drainage.

## 2016-06-14 NOTE — ED Provider Notes (Signed)
[facility] DEPT Provider Note   CSN: [id number] Arrival date & time: 06/14/16  1431     History   Chief Complaint Chief Complaint  Patient presents with  . Wound Check    HPI Linda [name] is a 20 y.o. female hx of lupus, migraines, Previous skin infections here presenting with wound in calf. Patient states that she has multiple skin infections in the past and most recently had an abscess that was drained in the left calf in May. She noticed that her wound on the right calf has been draining more over the last several days. States has surrounding area has been more red and painful. She has a history of lupus but is in the process of changing primary care doctor so is not currently taking any medicines. In particular, she is not on any immunosuppressants or prednisone. She denies any history of MRSA infections.  The history is provided by the patient.    Past Medical History:  Diagnosis Date  . Lupus   . Migraines   . Mixed connective tissue disease [facility])     Patient Active Problem List   Diagnosis Date Noted  . Mixed connective tissue disease ([place]) 02/11/2016  . Trichimoniasis 01/19/2014  . High BMI 01/19/2014  . Implanon in place 01/19/2014    Past Surgical History:  Procedure Laterality Date  . MULTIPLE TOOTH EXTRACTIONS      OB History    Gravida Para Term Preterm AB Living   0 0 0 0 0 0   SAB TAB Ectopic Multiple Live Births   0 0 0 0         Home Medications    Prior to Admission medications   Medication Sig Start Date End Date Taking? Authorizing Provider  etonogestrel (NEXPLANON) 68 MG IMPL implant 1 each by Subdermal route once.   Yes Historical Provider, MD  predniSONE (DELTASONE) 20 MG tablet Take 3 tablets for 7 days, 2 tablets for 4 days, 1 tablet for 3 days Patient not taking: Reported on 06/14/2016 05/18/16   Asiyah Cletis Media, MD    Family History Family History  Problem Relation Age of Onset  . Lupus Mother   . Hypertension  Mother   . Kidney disease Mother   . Diabetes Maternal Grandmother     Social History Social History  Substance Use Topics  . Smoking status: Current Every Day Smoker    Packs/day: 0.00    Types: Cigarettes, Cigars    Start date: 02/11/2015  . Smokeless tobacco: Never Used  . Alcohol use No     Comment: social     Allergies   Vicodin [hydrocodone-acetaminophen]   Review of Systems Review of Systems  Skin: Positive for wound.  All other systems reviewed and are negative.    Physical Exam Updated Vital Signs BP (!) 175/120 (BP Location: Right Arm)   Pulse 91   Temp 99.1 F (37.3 C) (Oral)   Resp 19   Ht 5\' 6"  (1.676 m)   Wt 220 lb (99.8 kg)   LMP 06/09/2016   SpO2 100%   BMI 35.51 kg/m   Physical Exam  Constitutional: She appears well-developed and well-nourished.  Uncomfortable   HENT:  Head: Normocephalic.  Eyes: Pupils are equal, round, and reactive to light.  Neck: Normal range of motion.  Cardiovascular: Normal rate.   Pulmonary/Chest: Effort normal and breath sounds normal.  Abdominal: Soft. Bowel sounds are normal.  Musculoskeletal: Normal range of motion.  Neurological: She is alert.  Skin:  Multiple scabs on her legs and arms from previous infections. There is a skin ulcer mid calf with mild purulent drainage with cellululitis with no   Nursing note and vitals reviewed.      ED Treatments / Results  Labs (all labs ordered are listed, but only abnormal results are displayed) Labs Reviewed  COMPREHENSIVE METABOLIC PANEL - Abnormal; Notable for the following:       Result Value   ALT 10 (*)    All other components within normal limits  AEROBIC CULTURE (SUPERFICIAL SPECIMEN)  CBC WITH DIFFERENTIAL/PLATELET  I-STAT CG4 LACTIC ACID, ED  I-STAT BETA HCG BLOOD, ED ([facility], [facility], [facility] ONLY)    EKG  EKG Interpretation None       Radiology No results found.  Procedures Procedures (including critical care time)  Medications Ordered in  ED Medications  clindamycin (CLEOCIN) IVPB 600 mg (0 mg Intravenous Stopped 06/14/16 1933)  morphine 4 MG/ML injection 4 mg (4 mg Intravenous Given 06/14/16 1835)  lisinopril (PRINIVIL,ZESTRIL) tablet 10 mg (10 mg Oral Given 06/14/16 1830)     Initial Impression / Assessment and Plan / ED Course  I have reviewed the triage vital signs and the nursing notes.  Pertinent labs & imaging results that were available during my care of the patient were reviewed by me and considered in my medical decision making (see chart for details).  Clinical Course    Linda [name] is a 20 y.o. female here with cellulitis of the leg. Low grade temp, has multiple lesions so I wonder if she has MRSA. Lactate nl, WBC nl. Wound sent for culture. No evidence of abscess. Given clinda and will dc home with clinda. Hypertensive but not on meds, will dc home with lisinopril and encourage PCP follow up    Final Clinical Impressions(s) / ED Diagnoses   Final diagnoses:  None    New Prescriptions New Prescriptions   No medications on file     Drenda Freeze, MD 06/14/16 1956

## 2016-06-14 NOTE — ED Notes (Signed)
Pt states she is leaving.  RN assessed pt's wound and informed her that she really needs to stay to be seen.  Encouraged her to stay to see MD.  States her leg hurts if it is not propped up.  Informed pt we would give her some blankets to prop leg up on bench.  Pt agreed.

## 2016-06-14 NOTE — Discharge Instructions (Signed)
Take clindamycin as prescribed.   Take percocet as needed for pain.   Take lisinopril daily.   See your doctor  Return to ER if you have worse wound drainage, fever, redness around the wound

## 2016-06-16 ENCOUNTER — Telehealth: Payer: Self-pay | Admitting: Internal Medicine

## 2016-06-16 NOTE — Telephone Encounter (Signed)
Pt was inquiring about rheumatology referral. Please advise. Thanks! ep

## 2016-06-17 LAB — AEROBIC CULTURE W GRAM STAIN (SUPERFICIAL SPECIMEN)

## 2016-06-18 ENCOUNTER — Telehealth (HOSPITAL_BASED_OUTPATIENT_CLINIC_OR_DEPARTMENT_OTHER): Payer: Self-pay | Admitting: Emergency Medicine

## 2016-06-18 NOTE — Telephone Encounter (Signed)
Post ED Visit - Positive Culture Follow-up  Culture report reviewed by antimicrobial stewardship pharmacist:  []  Elenor Quinones, Pharm.D. []  Heide Guile, Pharm.D., BCPS []  Parks Neptune, Pharm.D. []  Alycia Rossetti, Pharm.D., BCPS []  [place], Pharm.D., BCPS, AAHIVP []  Legrand Como, Pharm.D., BCPS, AAHIVP []  Milus Glazier, Pharm.D. []  Rob Bennett, Pharmd Gwenlyn Perking PharmD  Positive wound culture Treated with clindamycin, organism sensitive to the same and no further patient follow-up is required at this time.  Hazle Nordmann 06/18/2016, 10:34 AM

## 2016-06-19 NOTE — Telephone Encounter (Addendum)
Please let patient know that she has an open referral to [facility] in [facility]. Please let patient know that she can call [phone] to make an appointment as was previously discussed at her last visit.

## 2016-06-27 ENCOUNTER — Emergency Department (HOSPITAL_COMMUNITY)
Admission: EM | Admit: 2016-06-27 | Discharge: 2016-06-27 | Disposition: A | Discharge status: Home / Self Care | Payer: Medicaid Other | Attending: Emergency Medicine | Admitting: Emergency Medicine

## 2016-06-27 ENCOUNTER — Encounter (HOSPITAL_COMMUNITY): Payer: Self-pay | Admitting: *Deleted

## 2016-06-27 ENCOUNTER — Telehealth (HOSPITAL_BASED_OUTPATIENT_CLINIC_OR_DEPARTMENT_OTHER): Payer: Self-pay

## 2016-06-27 DIAGNOSIS — F17210 Nicotine dependence, cigarettes, uncomplicated: Secondary | ICD-10-CM | POA: Diagnosis not present

## 2016-06-27 DIAGNOSIS — S8992XA Unspecified injury of left lower leg, initial encounter: Secondary | ICD-10-CM | POA: Diagnosis present

## 2016-06-27 DIAGNOSIS — Y999 Unspecified external cause status: Secondary | ICD-10-CM | POA: Diagnosis not present

## 2016-06-27 DIAGNOSIS — X58XXXA Exposure to other specified factors, initial encounter: Secondary | ICD-10-CM | POA: Insufficient documentation

## 2016-06-27 DIAGNOSIS — Y939 Activity, unspecified: Secondary | ICD-10-CM | POA: Diagnosis not present

## 2016-06-27 DIAGNOSIS — Y929 Unspecified place or not applicable: Secondary | ICD-10-CM | POA: Diagnosis not present

## 2016-06-27 DIAGNOSIS — S81802A Unspecified open wound, left lower leg, initial encounter: Secondary | ICD-10-CM

## 2016-06-27 MED ORDER — OXYCODONE-ACETAMINOPHEN 5-325 MG PO TABS: 2.0000 | ORAL_TABLET | ORAL | 0 refills | Status: DC | PRN

## 2016-06-27 MED ORDER — FLUCONAZOLE 200 MG PO TABS: ORAL_TABLET | ORAL | 0 refills | Status: DC

## 2016-06-27 MED ORDER — OXYCODONE-ACETAMINOPHEN 5-325 MG PO TABS
2.0000 | ORAL_TABLET | Freq: Once | ORAL | Status: AC
  Administered 2016-06-27: 2 via ORAL
  Filled 2016-06-27: qty 2

## 2016-06-27 NOTE — ED Triage Notes (Signed)
Pt has open sore to right posterior calf for the past 2-3 weeks. Pt was seen for wound but states it is not getting better. PT has been taking antibiotics as prescribed.

## 2016-06-27 NOTE — Telephone Encounter (Signed)
Pt calling for cx results.  Informed her culture (+) for Staph aureus and Clindamycin prescribed to her is the approp tx.

## 2016-06-28 NOTE — ED Provider Notes (Signed)
[place] DEPT Provider Note   CSN: [id number] Arrival date & time: 06/27/16  1856     History   Chief Complaint Chief Complaint  Patient presents with  . Wound Infection    HPI Linda [name] is a 20 y.o. female. She presents with a complaint of an unresolved wound in her left leg. Has a history of lupus. Has previous skin infections. Seen and evaluated here on 10:15. Placed on anabolic for left posterior calf wound. States is "getting worse. Occasionally drains occasionally will bleed. No surrounding erythema. Does not feel ill in general fever shakes or chills.  HPI  Past Medical History:  Diagnosis Date  . Lupus   . Migraines   . Mixed connective tissue disease [facility])     Patient Active Problem List   Diagnosis Date Noted  . Mixed connective tissue disease ([place]) 02/11/2016  . Trichimoniasis 01/19/2014  . High BMI 01/19/2014  . Implanon in place 01/19/2014    Past Surgical History:  Procedure Laterality Date  . MULTIPLE TOOTH EXTRACTIONS      OB History    Gravida Para Term Preterm AB Living   0 0 0 0 0 0   SAB TAB Ectopic Multiple Live Births   0 0 0 0         Home Medications    Prior to Admission medications   Medication Sig Start Date End Date Taking? Authorizing Provider  clindamycin (CLEOCIN) 300 MG capsule Take 1 capsule (300 mg total) by mouth 4 (four) times daily. X 7 days 06/14/16   Drenda Freeze, MD  etonogestrel (NEXPLANON) 68 MG IMPL implant 1 each by Subdermal route once.    Historical Provider, MD  fluconazole (DIFLUCAN) 200 MG tablet 1 by mouth now. Repeat 1 in 7 days. 06/27/16   Tanna Furry, MD  lisinopril (PRINIVIL,ZESTRIL) 10 MG tablet Take 1 tablet (10 mg total) by mouth daily. 06/14/16   Drenda Freeze, MD  oxyCODONE-acetaminophen (PERCOCET/ROXICET) 5-325 MG tablet Take 2 tablets by mouth every 4 (four) hours as needed. 06/27/16   Tanna Furry, MD  predniSONE (DELTASONE) 20 MG tablet Take 3 tablets for 7 days, 2 tablets  for 4 days, 1 tablet for 3 days Patient not taking: Reported on 06/14/2016 05/18/16   Asiyah Cletis Media, MD    Family History Family History  Problem Relation Age of Onset  . Lupus Mother   . Hypertension Mother   . Kidney disease Mother   . Diabetes Maternal Grandmother     Social History Social History  Substance Use Topics  . Smoking status: Current Every Day Smoker    Packs/day: 0.00    Types: Cigarettes, Cigars    Start date: 02/11/2015  . Smokeless tobacco: Never Used  . Alcohol use No     Comment: social     Allergies   Vicodin [hydrocodone-acetaminophen]   Review of Systems Review of Systems  Constitutional: Negative for appetite change, chills, diaphoresis, fatigue and fever.  HENT: Negative for mouth sores, sore throat and trouble swallowing.   Eyes: Negative for visual disturbance.  Respiratory: Negative for cough, chest tightness, shortness of breath and wheezing.   Cardiovascular: Negative for chest pain.  Gastrointestinal: Negative for abdominal distention, abdominal pain, diarrhea, nausea and vomiting.  Endocrine: Negative for polydipsia, polyphagia and polyuria.  Genitourinary: Negative for dysuria, frequency and hematuria.  Musculoskeletal: Negative for gait problem.  Skin: Positive for wound. Negative for color change, pallor and rash.  Neurological: Negative for dizziness, syncope, light-headedness  and headaches.  Hematological: Does not bruise/bleed easily.  Psychiatric/Behavioral: Negative for behavioral problems and confusion.     Physical Exam Updated Vital Signs BP (!) 167/110   Pulse 73   Temp 97.7 F (36.5 C) (Oral)   Resp 18   LMP 06/09/2016   SpO2 100%   Physical Exam  Constitutional: She is oriented to person, place, and time. She appears well-developed and well-nourished. No distress.  HENT:  Head: Normocephalic.  Eyes: Conjunctivae are normal. Pupils are equal, round, and reactive to light. No scleral icterus.  Neck: Normal  range of motion. Neck supple. No thyromegaly present.  Cardiovascular: Normal rate and regular rhythm.  Exam reveals no gallop and no friction rub.   No murmur heard. Pulmonary/Chest: Effort normal and breath sounds normal. No respiratory distress. She has no wheezes. She has no rales.  Abdominal: Soft. Bowel sounds are normal. She exhibits no distension. There is no tenderness. There is no rebound.  Musculoskeletal: Normal range of motion.  Neurological: She is alert and oriented to person, place, and time.  Skin: Skin is warm and dry. No rash noted.     Psychiatric: She has a normal mood and affect. Her behavior is normal.     ED Treatments / Results  Labs (all labs ordered are listed, but only abnormal results are displayed) Labs Reviewed - No data to display  EKG  EKG Interpretation None       Radiology No results found.  Procedures Procedures (including critical care time)  Medications Ordered in ED Medications  oxyCODONE-acetaminophen (PERCOCET/ROXICET) 5-325 MG per tablet 2 tablet (2 tablets Oral Given 06/27/16 2131)     Initial Impression / Assessment and Plan / ED Course  I have reviewed the triage vital signs and the nursing notes.  Pertinent labs & imaging results that were available during my care of the patient were reviewed by me and considered in my medical decision making (see chart for details).  Clinical Course    Wounds were debrided with the contents swab and gauze. Then wet-to-dry dressings placed in an Ace wrap. Referred to wound care clinic to [facility] her clindamycin. She states she has symptoms of vaginal yeast infection, requests Diflucan.  Final Clinical Impressions(s) / ED Diagnoses   Final diagnoses:  Wound of left lower extremity, initial encounter    New Prescriptions Discharge Medication List as of 06/27/2016  9:38 PM    START taking these medications   Details  fluconazole (DIFLUCAN) 200 MG tablet 1 by mouth  now. Repeat 1 in 7 days., Print         Tanna Furry, MD 06/28/16 [phone]

## 2016-06-29 ENCOUNTER — Encounter: Payer: Self-pay | Admitting: Internal Medicine

## 2016-06-29 ENCOUNTER — Ambulatory Visit (INDEPENDENT_AMBULATORY_CARE_PROVIDER_SITE_OTHER): Payer: Medicaid Other | Admitting: Internal Medicine

## 2016-06-29 DIAGNOSIS — S81801D Unspecified open wound, right lower leg, subsequent encounter: Secondary | ICD-10-CM

## 2016-06-29 DIAGNOSIS — S81801A Unspecified open wound, right lower leg, initial encounter: Secondary | ICD-10-CM | POA: Insufficient documentation

## 2016-06-29 MED ORDER — OXYCODONE-ACETAMINOPHEN 10-325 MG PO TABS: ORAL_TABLET | ORAL | 0 refills | Status: DC

## 2016-06-29 NOTE — Assessment & Plan Note (Signed)
Currently completing treatment with clindamycin. No reported signs of systemic infection. No signs of worsening infection on physical exam when compared to pictures from prior evaluations. Patient already with wound care appointment on November 10.  - Ibuprofen 800mg  q6-8 PRN pain - Prescribed Percocet 10-325mg  #15 not to be filled before 07/09/16 to be taken one hour prior to any wound care procedures.   Of note, patient became very agitated upon learning that she was not being prescribed narcotics for the interim until wound care appointment. Discussed that she should attempt ibuprofen first for pain, as continued narcotics, especially given patient's recurrent infections, are not an option.

## 2016-06-29 NOTE — Progress Notes (Signed)
   Subjective:    Patient ID: Linda [name], female    DOB: Sep 28, 1995, 20 y.o.   MRN: [id number]  HPI  Patient presents for same day appointment for wound.   Wound Patient reports first appeared about a month ago. Patient with PMH of lupus and multiple previous skin infections. She has been to ED twice for this, most recently on 10/28. She is not taking clindamycin prescribed in ED. She thinks that her wound is worsening. She has an appointment with wound care on Nov 10, but was only given enough pain meds in the ED for one day. Patient describes pain currently as 9/10 because she wore pants here, and describes pain as throbbing and aching. Does endorse drainage. Denies fevers, chills, nausea, vomiting, diarrhea, abdominal pain.  Patient is here today because she's concerned that without prescription pain meds she will not be able to stand the debridement when she goes to wound care clinic.   Smoking status reviewed.   Review of Systems See HPI.     Objective:   Physical Exam  Constitutional: She is oriented to person, place, and time. She appears well-developed and well-nourished. No distress.  HENT:  Head: Normocephalic and atraumatic.  Pulmonary/Chest: Effort normal. No respiratory distress.  Neurological: She is alert and oriented to person, place, and time.  Skin:  ~4cmx2cm lesion on R medial calf. Dressing non-bloody and dry. No surrounding erythema. Currently no drainage or active bleeding. Able to walk unassisted. Multiple scars throughout body from previous lesions and skin infections.   Psychiatric: She has a normal mood and affect. Her behavior is normal.       Assessment & Plan:  Wound of right leg Currently completing treatment with clindamycin. No reported signs of systemic infection. No signs of worsening infection on physical exam when compared to pictures from prior evaluations. Patient already with wound care appointment on November 10.  - Ibuprofen 800mg  q6-8  PRN pain - Prescribed Percocet 10-325mg  #15 not to be filled before 07/09/16 to be taken one hour prior to any wound care procedures.   Of note, patient became very agitated upon learning that she was not being prescribed narcotics for the interim until wound care appointment. Discussed that she should attempt ibuprofen first for pain, as continued narcotics, especially given patient's recurrent infections, are not an option.   Adin Hector, MD, MPH PGY-2 [place] Pager [phone]

## 2016-06-29 NOTE — Patient Instructions (Addendum)
It was nice meeting you today Linda [name]!  For pain, you can take 800 mg of ibuprofen (4 regular strength tablets) every 6-8 hours as needed.   One hour before any wound care appointments, you can take one of the Percocet tablets.   If you develop fevers, nausea, vomiting, or abdominal pain, please call our office or go to the emergency room, as these can be signs of infection.   It is important to continue taking your antibiotics until they are finished.   If you have any questions or concerns, please feel free to call the clinic.   Be well,  Dr. Avon Gully

## 2016-07-08 ENCOUNTER — Telehealth: Payer: Self-pay | Admitting: Internal Medicine

## 2016-07-08 NOTE — Telephone Encounter (Signed)
Pt was given RX for percocet to be taken before her appt for wound care.  She left it in her car and it was repo'd  and she left it in the car and cannot get it. She would like to have another RX. Please advise

## 2016-07-12 NOTE — Telephone Encounter (Signed)
Discussed with [place] while patient was on the phone. Could not prescribe any further pain management with percocet. However, if patient would like advice regarding non-narcotic options would be happy to help.

## 2016-07-13 ENCOUNTER — Encounter (HOSPITAL_BASED_OUTPATIENT_CLINIC_OR_DEPARTMENT_OTHER): Payer: No Typology Code available for payment source

## 2016-07-13 ENCOUNTER — Ambulatory Visit: Payer: Medicaid Other | Admitting: Internal Medicine

## 2016-07-28 ENCOUNTER — Encounter (HOSPITAL_BASED_OUTPATIENT_CLINIC_OR_DEPARTMENT_OTHER): Payer: No Typology Code available for payment source

## 2016-08-12 ENCOUNTER — Encounter (HOSPITAL_BASED_OUTPATIENT_CLINIC_OR_DEPARTMENT_OTHER): Payer: No Typology Code available for payment source | Attending: Surgery

## 2016-09-09 ENCOUNTER — Ambulatory Visit: Payer: Medicaid Other | Admitting: Internal Medicine

## 2016-10-11 ENCOUNTER — Emergency Department (HOSPITAL_COMMUNITY)
Admission: EM | Admit: 2016-10-11 | Discharge: 2016-10-12 | Disposition: A | Discharge status: Home / Self Care | Payer: Self-pay | Attending: Emergency Medicine | Admitting: Emergency Medicine

## 2016-10-11 ENCOUNTER — Encounter (HOSPITAL_COMMUNITY): Payer: Self-pay | Admitting: *Deleted

## 2016-10-11 DIAGNOSIS — M79671 Pain in right foot: Secondary | ICD-10-CM | POA: Insufficient documentation

## 2016-10-11 DIAGNOSIS — Z79899 Other long term (current) drug therapy: Secondary | ICD-10-CM | POA: Insufficient documentation

## 2016-10-11 DIAGNOSIS — I159 Secondary hypertension, unspecified: Secondary | ICD-10-CM

## 2016-10-11 DIAGNOSIS — I10 Essential (primary) hypertension: Secondary | ICD-10-CM | POA: Insufficient documentation

## 2016-10-11 DIAGNOSIS — F17210 Nicotine dependence, cigarettes, uncomplicated: Secondary | ICD-10-CM | POA: Insufficient documentation

## 2016-10-11 NOTE — ED Triage Notes (Signed)
Pain in right foot which began this am when pt got up.  Pain on the bottom of the foot, pt denies any trauma.  Pt is hypertensive.  She states that she has been out of her BP meds since she turned 20 in December and lost medicaid and has not been able to get her medication.

## 2016-10-11 NOTE — ED Provider Notes (Signed)
[place] DEPT Provider Note   CSN: [id number] Arrival date & time: 10/11/16  2255     History   Chief Complaint Chief Complaint  Patient presents with  . Foot Pain    HPI Linda [name] is a 21 y.o. female with a pmh of lupus and hypertension who presents to the Ed with cc of R foot pain. She has been off of her hypertension meds since she turned 20 and takes No medications for her lupus. Patient states that she woke up with pain in her right foot today. She complains of pain in the dorsum of her foot. She is unable to wear a shoe on her foot today because it is so painful. She is also having difficulty ambulating. She denies injury. She c/o paresthesia and coolness in the extremity. She is a smoker and on birth control. She denies a hx of PE/DVT.  HPI  Past Medical History:  Diagnosis Date  . Lupus   . Migraines   . Mixed connective tissue disease [facility])     Patient Active Problem List   Diagnosis Date Noted  . Wound of right leg 06/29/2016  . Mixed connective tissue disease ([place]) 02/11/2016  . Trichimoniasis 01/19/2014  . High BMI 01/19/2014  . Implanon in place 01/19/2014    Past Surgical History:  Procedure Laterality Date  . MULTIPLE TOOTH EXTRACTIONS      OB History    Gravida Para Term Preterm AB Living   0 0 0 0 0 0   SAB TAB Ectopic Multiple Live Births   0 0 0 0         Home Medications    Prior to Admission medications   Medication Sig Start Date End Date Taking? Authorizing Provider  clindamycin (CLEOCIN) 300 MG capsule Take 1 capsule (300 mg total) by mouth 4 (four) times daily. X 7 days 06/14/16   Drenda Freeze, MD  etonogestrel (NEXPLANON) 68 MG IMPL implant 1 each by Subdermal route once.    Historical Provider, MD  fluconazole (DIFLUCAN) 200 MG tablet 1 by mouth now. Repeat 1 in 7 days. 06/27/16   Tanna Furry, MD  lisinopril (PRINIVIL,ZESTRIL) 10 MG tablet Take 1 tablet (10 mg total) by mouth daily. 06/14/16   Drenda Freeze, MD   oxyCODONE-acetaminophen (PERCOCET) 10-325 MG tablet Take one tablet one hour before wound care procedures. 06/29/16   Verner Mould, MD  predniSONE (DELTASONE) 20 MG tablet Take 3 tablets for 7 days, 2 tablets for 4 days, 1 tablet for 3 days Patient not taking: Reported on 06/14/2016 05/18/16   Asiyah Cletis Media, MD    Family History Family History  Problem Relation Age of Onset  . Lupus Mother   . Hypertension Mother   . Kidney disease Mother   . Diabetes Maternal Grandmother     Social History Social History  Substance Use Topics  . Smoking status: Current Every Day Smoker    Packs/day: 0.00    Types: Cigarettes, Cigars    Start date: 02/11/2015  . Smokeless tobacco: Never Used  . Alcohol use No     Comment: social     Allergies   Vicodin [hydrocodone-acetaminophen]   Review of Systems Review of Systems  Ten systems reviewed and are negative for acute change, except as noted in the HPI.   Physical Exam Updated Vital Signs BP (!) 183/130   Pulse 87   Temp 97.8 F (36.6 C) (Oral)   Resp 18   Wt 99.8  kg   SpO2 100%   BMI 35.51 kg/m   Physical Exam  Constitutional: She is oriented to person, place, and time. She appears well-developed and well-nourished. No distress.  HENT:  Head: Normocephalic and atraumatic.  Eyes: Conjunctivae are normal. No scleral icterus.  Neck: Normal range of motion.  Cardiovascular: Normal rate, regular rhythm and normal heart sounds.  Exam reveals no gallop and no friction rub.   No murmur heard. Pulmonary/Chest: Effort normal and breath sounds normal. No respiratory distress.  Abdominal: Soft. Bowel sounds are normal. She exhibits no distension and no mass. There is no tenderness. There is no guarding.  Musculoskeletal:  R foot tender across the dorsum. Very tender at the 1st mtp joint, worse with palpation and movement of the joint. No difference in temperature. No UL swelling.   Neurological: She is alert and  oriented to person, place, and time.  Skin: Skin is warm and dry. She is not diaphoretic.  Diffuse scarring   Nursing note and vitals reviewed.    ED Treatments / Results  Labs (all labs ordered are listed, but only abnormal results are displayed) Labs Reviewed - No data to display  EKG  EKG Interpretation None       Radiology No results found.  Procedures Procedures (including critical care time)  Medications Ordered in ED Medications - No data to display   Initial Impression / Assessment and Plan / ED Course  I have reviewed the triage vital signs and the nursing notes.  Pertinent labs & imaging results that were available during my care of the patient were reviewed by me and considered in my medical decision making (see chart for details).      patient receiving work up. If negative, she may need lovenox. PATIENT WILL NEED FOLLOW UP AND ANTIHYPERTENSIVE MEDS. She will need a dvt r/o tomorrow. I have given sign out to Dr. Dina Rich.  Final Clinical Impressions(s) / ED Diagnoses   Final diagnoses:  None    New Prescriptions New Prescriptions   No medications on file     Margarita Mail, PA-C 10/12/16 0104    Merryl Hacker, MD 10/12/16 [phone]

## 2016-10-12 ENCOUNTER — Ambulatory Visit (HOSPITAL_COMMUNITY): Admission: RE | Admit: 2016-10-12 | Payer: Self-pay | Source: Ambulatory Visit

## 2016-10-12 ENCOUNTER — Emergency Department (HOSPITAL_COMMUNITY): Payer: Self-pay

## 2016-10-12 LAB — BASIC METABOLIC PANEL
Anion gap: 7 (ref 5–15)
BUN: 6 mg/dL (ref 6–20)
CO2: 26 mmol/L (ref 22–32)
Calcium: 9.3 mg/dL (ref 8.9–10.3)
Chloride: 105 mmol/L (ref 101–111)
Creatinine, Ser: 0.7 mg/dL (ref 0.44–1.00)
GFR calc Af Amer: >60 mL/min (ref >60)
GFR calc non Af Amer: >60 mL/min (ref >60)
Glucose, Bld: 88 mg/dL (ref 65–99)
Potassium: 4.2 mmol/L (ref 3.5–5.1)
Sodium: 138 mmol/L (ref 135–145)

## 2016-10-12 LAB — CBC WITH DIFFERENTIAL/PLATELET
Basophils Absolute: 0 10*3/uL (ref 0.0–0.1)
Basophils Relative: 0 %
Eosinophils Absolute: 0.4 10*3/uL (ref 0.0–0.7)
Eosinophils Relative: 4 %
HCT: 38.9 % (ref 36.0–46.0)
Hemoglobin: 12.6 g/dL (ref 12.0–15.0)
Lymphocytes Relative: 48 %
Lymphs Abs: 4.2 10*3/uL — ABNORMAL HIGH (ref 0.7–4.0)
MCH: 31 pg (ref 26.0–34.0)
MCHC: 32.4 g/dL (ref 30.0–36.0)
MCV: 95.8 fL (ref 78.0–100.0)
Monocytes Absolute: 0.8 10*3/uL (ref 0.1–1.0)
Monocytes Relative: 9 %
Neutro Abs: 3.5 10*3/uL (ref 1.7–7.7)
Neutrophils Relative %: 39 %
Platelets: 191 10*3/uL (ref 150–400)
RBC: 4.06 MIL/uL (ref 3.87–5.11)
RDW: 13.2 % (ref 11.5–15.5)
WBC: 9 10*3/uL (ref 4.0–10.5)

## 2016-10-12 LAB — I-STAT BETA HCG BLOOD, ED (MC, WL, AP ONLY): I-stat hCG, quantitative: <5 m[IU]/mL (ref <5)

## 2016-10-12 MED ORDER — ACETAMINOPHEN 500 MG PO TABS: 500.0000 mg | ORAL_TABLET | Freq: Four times a day (QID) | ORAL | 0 refills | Status: DC | PRN

## 2016-10-12 MED ORDER — HYDROCHLOROTHIAZIDE 25 MG PO TABS: 25.0000 mg | ORAL_TABLET | Freq: Every day | ORAL | 0 refills | Status: DC

## 2016-10-12 MED ORDER — ONDANSETRON 4 MG PO TBDP
4.0000 mg | ORAL_TABLET | Freq: Once | ORAL | Status: AC
  Administered 2016-10-12: 4 mg via ORAL
  Filled 2016-10-12: qty 1

## 2016-10-12 MED ORDER — OXYCODONE-ACETAMINOPHEN 5-325 MG PO TABS
1.0000 | ORAL_TABLET | Freq: Once | ORAL | Status: AC
  Administered 2016-10-12: 1 via ORAL
  Filled 2016-10-12: qty 1

## 2016-10-12 NOTE — ED Notes (Signed)
Delay in lab draw pt not in room at this time

## 2016-10-12 NOTE — ED Notes (Signed)
Pt verbalized understanding discharge instructions and denies any further needs or questions at this time. VS stable, ambulatory and steady gait.   

## 2016-10-12 NOTE — Discharge Instructions (Signed)
You were seen today for foot pain. Your x-rays are reassuring. You will be brought back tomorrow for ultrasound of your leg. See instructions.  You also notably hypertensive. He'll be started on a blood pressure medication. Follow-up with her primary physician for recheck and further titration of blood pressure medications.

## 2016-10-13 ENCOUNTER — Ambulatory Visit (HOSPITAL_COMMUNITY)
Admission: RE | Admit: 2016-10-13 | Discharge: 2016-10-13 | Disposition: A | Discharge status: Home / Self Care | Payer: Self-pay | Source: Ambulatory Visit | Attending: Emergency Medicine | Admitting: Emergency Medicine

## 2016-10-13 DIAGNOSIS — M79609 Pain in unspecified limb: Secondary | ICD-10-CM

## 2016-10-13 DIAGNOSIS — M79605 Pain in left leg: Secondary | ICD-10-CM | POA: Insufficient documentation

## 2016-10-13 DIAGNOSIS — M7989 Other specified soft tissue disorders: Secondary | ICD-10-CM

## 2016-10-13 NOTE — Progress Notes (Signed)
VASCULAR LAB PRELIMINARY  PRELIMINARY  PRELIMINARY  PRELIMINARY  Right lower extremity venous duplex completed.    Preliminary report:  Right:  No evidence of DVT, superficial thrombosis, or Baker's cyst.  SLAUGHTER, [place], RVS 10/13/2016, 2:09 PM

## 2016-11-20 ENCOUNTER — Ambulatory Visit: Payer: Medicaid Other | Admitting: Internal Medicine

## 2016-12-09 ENCOUNTER — Ambulatory Visit (INDEPENDENT_AMBULATORY_CARE_PROVIDER_SITE_OTHER): Payer: Self-pay | Admitting: Internal Medicine

## 2016-12-09 ENCOUNTER — Encounter: Payer: Self-pay | Admitting: Internal Medicine

## 2016-12-09 VITALS — BP 160/99 | HR 92 | Temp 98.2°F | Ht 66.0 in | Wt 217.0 lb

## 2016-12-09 DIAGNOSIS — I10 Essential (primary) hypertension: Secondary | ICD-10-CM

## 2016-12-09 DIAGNOSIS — M351 Other overlap syndromes: Secondary | ICD-10-CM

## 2016-12-09 MED ORDER — HYDROCHLOROTHIAZIDE 25 MG PO TABS: 25.0000 mg | ORAL_TABLET | Freq: Every day | ORAL | 2 refills | Status: DC

## 2016-12-09 MED ORDER — LISINOPRIL 10 MG PO TABS: 10.0000 mg | ORAL_TABLET | Freq: Every day | ORAL | 0 refills | Status: DC

## 2016-12-09 MED ORDER — HYDROCHLOROTHIAZIDE 25 MG PO TABS: 25.0000 mg | ORAL_TABLET | Freq: Every day | ORAL | 0 refills | Status: DC

## 2016-12-09 MED ORDER — PREDNISONE 20 MG PO TABS: ORAL_TABLET | ORAL | 0 refills | Status: DC

## 2016-12-09 MED ORDER — LISINOPRIL 10 MG PO TABS: 10.0000 mg | ORAL_TABLET | Freq: Every day | ORAL | 2 refills | Status: DC

## 2016-12-09 NOTE — Patient Instructions (Addendum)
Please continue your HCTZ and restart your lisinopril follow up with me in two weeks to check your blood pressure and kidney function   Try to get in contact with Rheumatology - [facility] in [facility]. Can call [phone] for an appointment   I will provide you with 7 days of prednisone for small flare up

## 2016-12-09 NOTE — Progress Notes (Signed)
   [facility] Kerrin Mo, MD Phone: [phone]  Reason For Visit:   # Elevated blood pressure  - Patient with elevated blood pressure over the past 6 months. She has been on and off of hydrochlorothiazide and lisinopril. Most recently she took her hydrochlorothiazide yesterday. - Patient with a elevated BMI though no significant weight gain - Denies CP, dyspnea, HA, - Indicates that she had an episode last Friday of not being able to find her words while in the grocery store this lasted for about a couple of minutes or so. She is worried that she had a stroke. however denies any current symptoms. does have a recent history of elevated blood pressures not on any medications.  # Mixed Connective tissue Disorder - Patient has not been seen by rheumatology in several years - Currently does not have Medicaid coverage, is reapplying for Medicaid  Past Medical History Reviewed problem list.  Medications- reviewed and updated No additions to family history Social history- patient is a non-smoker  Objective: BP (!) 160/99 (BP Location: Right Arm, Patient Position: Sitting, Cuff Size: Normal)   Pulse 92   Temp 98.2 F (36.8 C) (Oral)   Ht 5\' 6"  (1.676 m)   Wt 217 lb (98.4 kg)   LMP 12/09/2016   SpO2 94%   BMI 35.02 kg/m  Gen: NAD, alert, cooperative with exam Cardio: regular rate and rhythm, S1S2 heard, no murmurs appreciated Pulm: clear to auscultation bilaterally, no wheezes, rhonchi or rales Skin: small lesion noted on right thigh, no signs of infection noted   Assessment/Plan: See problem based a/p  Mixed connective tissue disease ([place]) Will provide 7 days of prednisone -minimize side effects per patient request  Discussed follow up with Rheumatology  Hypertension Elevated blood pressure -Will refill patient's hydrochlorothiazide and restart lisinopril -Follow up in two weeks  - Will get BMET at next visit

## 2016-12-10 ENCOUNTER — Telehealth: Payer: Self-pay | Admitting: Internal Medicine

## 2016-12-10 NOTE — Telephone Encounter (Signed)
Will forward to MD to advise.  Note not finished yet so I am unsure as to what was discussed during this visit. Jazmin Hartsell,CMA

## 2016-12-10 NOTE — Telephone Encounter (Signed)
Pt wants to know why blood work was not ordered yesterday. Pt states she had stoke like symptoms prior to appointment and that pt's BP was high yesterday. Pt would like a nurse or PCP to call her. ep

## 2016-12-10 NOTE — Telephone Encounter (Signed)
Dr. Emmaline Life,  Just Lititz, patient does not have full Medicaid. She only has [facility] which will not pay for her to see a rheumatologist. So patient will be considered self pay. Please make sure patient is aware of this before placing this referral.  Thanks, Tia

## 2016-12-10 NOTE — Telephone Encounter (Signed)
Patient called today requesting to be referred to:  Specialist:  rheumatologist  Provider name[facility]phone [facility]:  [facility] Diagnosis (code):  lupus Date of last OV:  12-09-16  Note routed to provider and Referral Coordinator [facility]).  Will call patient back regarding referral status.  Linda [name]

## 2016-12-11 DIAGNOSIS — I10 Essential (primary) hypertension: Secondary | ICD-10-CM | POA: Insufficient documentation

## 2016-12-11 NOTE — Assessment & Plan Note (Signed)
Will provide 7 days of prednisone -minimize side effects per patient request  Discussed follow up with Rheumatology

## 2016-12-11 NOTE — Telephone Encounter (Signed)
Tried to call patient to discuss her concerns, no answer, did not leave a message due to HIPPA concerns. Not sure why she was not able to find her words in the grocery store about a week ago, however discussed that if she had significant symptoms again that lasted more than a few seconds to go to the ED for evaluation. There is no lab work to do for this issue. If it occurs again she could consider going to the ED for imaging. - Please call patient to reiterate this and let her know that if she has any further concerns I would be willing to call her again to discuss this further. Please also let her know that her current Medicaid does not cover rheumatology unfortunately. We can try to get her on the [facility] card program, she will need to go to the health department for further information on this.

## 2016-12-11 NOTE — Assessment & Plan Note (Signed)
Elevated blood pressure -Will refill patient's hydrochlorothiazide and restart lisinopril -Follow up in two weeks  - Will get BMET at next visit

## 2016-12-14 NOTE — Telephone Encounter (Addendum)
Spoke with patient and informed her of message from MD, she states she thinks that her symptoms may have been related to her lupus and that's why she thought MD was going to do blood work. Patient would like MD to give her a call back.

## 2016-12-15 NOTE — Telephone Encounter (Signed)
Called patient per request, no answer. Left a message letting her know that I had given her call.

## 2016-12-23 ENCOUNTER — Ambulatory Visit (INDEPENDENT_AMBULATORY_CARE_PROVIDER_SITE_OTHER): Payer: Self-pay | Admitting: Internal Medicine

## 2016-12-23 ENCOUNTER — Encounter: Payer: Self-pay | Admitting: Internal Medicine

## 2016-12-23 VITALS — BP 152/108 | HR 73 | Temp 97.7°F | Ht 66.0 in | Wt 219.4 lb

## 2016-12-23 DIAGNOSIS — I10 Essential (primary) hypertension: Secondary | ICD-10-CM

## 2016-12-23 MED ORDER — LISINOPRIL 40 MG PO TABS: 40.0000 mg | ORAL_TABLET | Freq: Every day | ORAL | 0 refills | Status: DC

## 2016-12-23 NOTE — Progress Notes (Signed)
   [name] Kerrin Mo, MD Phone: [phone]  Reason For Visit: F/U for HTN   # CHRONIC HTN: Current Meds - has been taking her lisinopril and HCTZ as prescribed Reports good compliance, took meds today. Tolerating well, w/o complaints. Denies CP, dyspnea, HA, edema, dizziness / lightheadedness Has recently been on a steroid burst, lesions from mixed connective tissue have resolved  Past Medical History Reviewed problem list.  Medications- reviewed and updated No additions to family history Social history- patient is a non -smoker  Objective: BP (!) 152/108   Pulse 73   Temp 97.7 F (36.5 C) (Oral)   Ht 5\' 6"  (1.676 m)   Wt 219 lb 6.4 oz (99.5 kg)   LMP 12/09/2016   SpO2 95%   BMI 35.41 kg/m  Gen: NAD, alert, cooperative with exam Cardio: regular rate and rhythm, S1S2 heard, no murmurs appreciated Pulm: clear to auscultation bilaterally, no wheezes, rhonchi or rales   Assessment/Plan: See problem based a/p  Hypertension Blood pressure still uncontrolled. No symptoms. Some concern given patient's age and autoimmune hx.   - Will increase Lisinopril to 40 mg, continue HCTZ  - Follow up in two weeks   - Discussed the importance of weights loss

## 2016-12-23 NOTE — Patient Instructions (Addendum)
I want you to check your blood pressures at home for the next two weeks. I am going to increase your blood pressure medication. Please follow up in two weeks.

## 2016-12-24 LAB — BASIC METABOLIC PANEL
BUN/Creatinine Ratio: 13 (ref 9–23)
BUN: 10 mg/dL (ref 6–20)
CO2: 23 mmol/L (ref 18–29)
Calcium: 9.6 mg/dL (ref 8.7–10.2)
Chloride: 99 mmol/L (ref 96–106)
Creatinine, Ser: 0.76 mg/dL (ref 0.57–1.00)
GFR calc Af Amer: 130 mL/min/{1.73_m2} (ref >59)
GFR calc non Af Amer: 112 mL/min/{1.73_m2} (ref >59)
Glucose: 80 mg/dL (ref 65–99)
Potassium: 4.5 mmol/L (ref 3.5–5.2)
Sodium: 139 mmol/L (ref 134–144)

## 2016-12-25 NOTE — Assessment & Plan Note (Addendum)
Blood pressure still uncontrolled. No symptoms. Some concern given patient's age and autoimmune hx.  - Will increase Lisinopril to 40 mg, continue HCTZ  - Follow up in two weeks   - Discussed the importance of weights loss - Will have patient stop steroids - unaware of prescription instructions - took 1 pill instead of 3. -- will not have patient continue this prescription as present - may relate to rise in blood pressure

## 2016-12-28 ENCOUNTER — Emergency Department (HOSPITAL_COMMUNITY): Payer: Medicaid Other

## 2016-12-28 ENCOUNTER — Encounter (HOSPITAL_COMMUNITY): Payer: Self-pay | Admitting: Vascular Surgery

## 2016-12-28 DIAGNOSIS — Z79899 Other long term (current) drug therapy: Secondary | ICD-10-CM | POA: Insufficient documentation

## 2016-12-28 DIAGNOSIS — F17210 Nicotine dependence, cigarettes, uncomplicated: Secondary | ICD-10-CM | POA: Insufficient documentation

## 2016-12-28 DIAGNOSIS — I10 Essential (primary) hypertension: Secondary | ICD-10-CM | POA: Insufficient documentation

## 2016-12-28 DIAGNOSIS — L03116 Cellulitis of left lower limb: Secondary | ICD-10-CM | POA: Insufficient documentation

## 2016-12-28 NOTE — ED Triage Notes (Signed)
Pt reports to the ED for eval of left knee pain. She denies any known injury to the leg. Onset of pain was this am. She states that she woke up with this pain. States that bearing weight and certain positions make the leg pain worse. Denies any past injuries or surgeries to the leg. No obvious swelling or deformity noted at this time.

## 2016-12-28 NOTE — ED Triage Notes (Signed)
Pt adds that she has had this pain before and was told it was cellulitis. States she did have to get IV abx for this in the past.

## 2016-12-29 ENCOUNTER — Emergency Department (HOSPITAL_COMMUNITY)
Admission: EM | Admit: 2016-12-29 | Discharge: 2016-12-29 | Disposition: A | Discharge status: Home / Self Care | Payer: Medicaid Other | Attending: Emergency Medicine | Admitting: Emergency Medicine

## 2016-12-29 DIAGNOSIS — L03116 Cellulitis of left lower limb: Secondary | ICD-10-CM

## 2016-12-29 LAB — CBC
HCT: 43.4 % (ref 36.0–46.0)
Hemoglobin: 14 g/dL (ref 12.0–15.0)
MCH: 30.2 pg (ref 26.0–34.0)
MCHC: 32.3 g/dL (ref 30.0–36.0)
MCV: 93.5 fL (ref 78.0–100.0)
Platelets: 186 10*3/uL (ref 150–400)
RBC: 4.64 MIL/uL (ref 3.87–5.11)
RDW: 13.1 % (ref 11.5–15.5)
WBC: 14.3 10*3/uL — ABNORMAL HIGH (ref 4.0–10.5)

## 2016-12-29 LAB — BASIC METABOLIC PANEL
Anion gap: 8 (ref 5–15)
BUN: 8 mg/dL (ref 6–20)
CO2: 25 mmol/L (ref 22–32)
Calcium: 9.2 mg/dL (ref 8.9–10.3)
Chloride: 102 mmol/L (ref 101–111)
Creatinine, Ser: 0.74 mg/dL (ref 0.44–1.00)
GFR calc Af Amer: >60 mL/min (ref >60)
GFR calc non Af Amer: >60 mL/min (ref >60)
Glucose, Bld: 83 mg/dL (ref 65–99)
Potassium: 3.6 mmol/L (ref 3.5–5.1)
Sodium: 135 mmol/L (ref 135–145)

## 2016-12-29 MED ORDER — DOXYCYCLINE HYCLATE 100 MG PO CAPS: 100.0000 mg | ORAL_CAPSULE | Freq: Two times a day (BID) | ORAL | 0 refills | Status: AC

## 2016-12-29 MED ORDER — DOXYCYCLINE HYCLATE 100 MG PO TABS
100.0000 mg | ORAL_TABLET | Freq: Once | ORAL | Status: AC
  Administered 2016-12-29: 100 mg via ORAL
  Filled 2016-12-29: qty 1

## 2016-12-29 MED ORDER — OXYCODONE-ACETAMINOPHEN 5-325 MG PO TABS
1.0000 | ORAL_TABLET | Freq: Once | ORAL | Status: AC
  Administered 2016-12-29: 1 via ORAL
  Filled 2016-12-29: qty 1

## 2016-12-29 NOTE — ED Provider Notes (Signed)
[place] DEPT Provider Note   CSN: [id number] Arrival date & time: 12/28/16  2328  History   Chief Complaint Chief Complaint  Patient presents with  . Leg Pain    HPI Linda [name] is a 21 y.o. female.  HPI  21 y.o. female with a hx of Lupus, Mixed Connective Tissue Disease, presents to the Emergency Department today complaining of left knee pain. Notes pain occurred this AM. Notes no trauma to area. States pain was there when she woke up. Has pain with weight bearing and certain positioning. Minimal pain at rest. Rates pain 6/10 currently. Does not endorse any swelling. No redness. States this occurred in the past once before and was told that it was cellulitis. Notes no fevers. No numbness/tingling.    Past Medical History:  Diagnosis Date  . Lupus   . Migraines   . Mixed connective tissue disease [facility])     Patient Active Problem List   Diagnosis Date Noted  . Hypertension 12/11/2016  . Mixed connective tissue disease ([place]) 02/11/2016  . High BMI 01/19/2014  . Implanon in place 01/19/2014    Past Surgical History:  Procedure Laterality Date  . MULTIPLE TOOTH EXTRACTIONS      OB History    Gravida Para Term Preterm AB Living   0 0 0 0 0 0   SAB TAB Ectopic Multiple Live Births   0 0 0 0         Home Medications    Prior to Admission medications   Medication Sig Start Date End Date Taking? Authorizing Provider  acetaminophen (TYLENOL) 500 MG tablet Take 1 tablet (500 mg total) by mouth every 6 (six) hours as needed. 10/12/16   Merryl Hacker, MD  etonogestrel (NEXPLANON) 68 MG IMPL implant 1 each by Subdermal route once.    Historical Provider, MD  fluconazole (DIFLUCAN) 200 MG tablet 1 by mouth now. Repeat 1 in 7 days. Patient not taking: Reported on 10/12/2016 06/27/16   Tanna Furry, MD  hydrochlorothiazide (HYDRODIURIL) 25 MG tablet Take 1 tablet (25 mg total) by mouth daily. 12/09/16   Asiyah Cletis Media, MD  lisinopril (PRINIVIL,ZESTRIL) 40 MG  tablet Take 1 tablet (40 mg total) by mouth daily. 12/23/16   Asiyah Cletis Media, MD  predniSONE (DELTASONE) 20 MG tablet Take 3 tablets for 7 days 12/09/16   Asiyah Cletis Media, MD    Family History Family History  Problem Relation Age of Onset  . Lupus Mother   . Hypertension Mother   . Kidney disease Mother   . Diabetes Maternal Grandmother     Social History Social History  Substance Use Topics  . Smoking status: Current Every Day Smoker    Packs/day: 0.00    Types: Cigarettes, Cigars    Start date: 02/11/2015  . Smokeless tobacco: Never Used  . Alcohol use No     Comment: social     Allergies   Vicodin [hydrocodone-acetaminophen]   Review of Systems Review of Systems ROS reviewed and all are negative for acute change except as noted in the HPI.  Physical Exam Updated Vital Signs BP (!) 155/109 (BP Location: Left Arm)   Pulse 86   Temp 97.7 F (36.5 C) (Oral)   Resp 18   LMP 12/09/2016   SpO2 100%   Physical Exam  Constitutional: She is oriented to person, place, and time. Vital signs are normal. She appears well-developed and well-nourished.  HENT:  Head: Normocephalic and atraumatic.  Right Ear: Hearing normal.  Left Ear: Hearing normal.  Eyes: Conjunctivae and EOM are normal. Pupils are equal, round, and reactive to light.  Neck: Neck supple.  Cardiovascular: Normal rate, regular rhythm, normal heart sounds and intact distal pulses.   Pulmonary/Chest: Effort normal and breath sounds normal.  Musculoskeletal: Normal range of motion.  Left Knee  Mild swelling noted. Minimal erythema. TTP along anterior/superior aspect of knee along patella. ROM limited due to pain, but is able to accomplish complete bend. NVI. Distal pulses appreciated   Neurological: She is alert and oriented to person, place, and time.  Skin: Skin is warm and dry.  Psychiatric: She has a normal mood and affect. Her speech is normal and behavior is normal. Thought content normal.    Nursing note and vitals reviewed.    ED Treatments / Results  Labs (all labs ordered are listed, but only abnormal results are displayed) Labs Reviewed  CBC - Abnormal; Notable for the following:       Result Value   WBC 14.3 (*)    All other components within normal limits  BASIC METABOLIC PANEL    EKG  EKG Interpretation None       Radiology Dg Knee Complete 4 Views Left  Result Date: 12/28/2016 CLINICAL DATA:  Pain and swelling of the left knee x1 day. No known injury. EXAM: LEFT KNEE - COMPLETE 4+ VIEW COMPARISON:  None. FINDINGS: Lace-like serpiginous calcifications are noted within the anterior and lateral soft tissues of the knee overlying the distal anterior thigh, patella, patellar tendon and pretibial soft tissues. No underlying fracture or malalignment. No joint effusion. No intra-articular loose bodies. IMPRESSION: 1. Soft tissue mineralization and calcifications noted along the anterior aspect of the thigh and proximal leg. Findings raise the possibility of a mixed connective tissue disorder, ie. scleroderma or lupus. Other possibilities might include venous insufficiency, posttraumatic calcifications or vascular phleboliths. 2. No acute osseous abnormality is seen. Electronically Signed   By: Ashley Royalty M.D.   On: 12/28/2016 23:59    Procedures Procedures (including critical care time)  Medications Ordered in ED Medications - No data to display   Initial Impression / Assessment and Plan / ED Course  I have reviewed the triage vital signs and the nursing notes.  Pertinent labs & imaging results that were available during my care of the patient were reviewed by me and considered in my medical decision making (see chart for details).  Final Clinical Impressions(s) / ED Diagnoses  {I have reviewed and evaluated the relevant laboratory values. {I have reviewed and evaluated the relevant imaging studies.  {I have reviewed the relevant previous healthcare records.   {I obtained HPI from historian. {Patient discussed with supervising physician.  ED Course:  Assessment: Patient X-Ray negative for obvious fracture or dislocation. Noted soft tissue mineralization along anterior aspect where patient is symptomatic. Pt with hx of Lupus and mixed connective tissue disorder which is consistent. No fevers. No numbness/tingling. Pt advised to follow up with her Rheumatologist. Discussed with attending physician. Likely cellulitis. Doubt septic joint. Given Rx Doxy. Strict return precautions given. Conservative therapy recommended and discussed. Patient will be discharged home & is agreeable with above plan. Returns precautions discussed. Pt appears safe for discharge.  Disposition/Plan:  DC Home Additional Verbal discharge instructions given and discussed with patient.  Pt Instructed to f/u withPCP in the next week for evaluation and treatment of symptoms. Return precautions given Pt acknowledges and agrees with plan  Supervising Physician Ezequiel Essex, MD  Final diagnoses:  Cellulitis of left knee    New Prescriptions New Prescriptions   No medications on file     Shary Decamp, PA-C 12/29/16 [place], MD 12/29/16 [phone]

## 2016-12-29 NOTE — Discharge Instructions (Signed)
Please read and follow all provided instructions.  Your diagnoses today include:  1. Cellulitis of left knee     Tests performed today include: Vital signs. See below for your results today.   Medications prescribed:  Take as prescribed   Home care instructions:  Follow any educational materials contained in this packet.  Follow-up instructions: Please follow-up with your primary care provider for further evaluation of symptoms and treatment   Return instructions:  Please return to the Emergency Department if you do not get better, if you get worse, or new symptoms OR  - Fever (temperature greater than 101.82F)  - Bleeding that does not stop with holding pressure to the area    -Severe pain (please note that you may be more sore the day after your accident)  - Chest Pain  - Difficulty breathing  - Severe nausea or vomiting  - Inability to tolerate food and liquids  - Passing out  - Skin becoming red around your wounds  - Change in mental status (confusion or lethargy)  - New numbness or weakness    Please return if you have any other emergent concerns.  Additional Information:  Your vital signs today were: BP (!) 155/109 (BP Location: Left Arm)    Pulse 86    Temp 97.7 F (36.5 C) (Oral)    Resp 18    LMP 12/09/2016    SpO2 100%  If your blood pressure (BP) was elevated above 135/85 this visit, please have this repeated by your doctor within one month. ---------------

## 2016-12-31 ENCOUNTER — Encounter: Payer: Self-pay | Admitting: Internal Medicine

## 2017-01-07 ENCOUNTER — Ambulatory Visit: Payer: Medicaid Other | Admitting: Internal Medicine

## 2017-01-19 ENCOUNTER — Telehealth: Payer: Self-pay | Admitting: Obstetrics

## 2017-01-19 NOTE — Telephone Encounter (Signed)
Called pt to inform her that her appt on the 29th will be an annual instead nexplanon removal.

## 2017-01-26 ENCOUNTER — Ambulatory Visit: Payer: Medicaid Other | Admitting: Obstetrics

## 2017-03-22 ENCOUNTER — Telehealth: Payer: Self-pay | Admitting: Internal Medicine

## 2017-03-22 NOTE — Telephone Encounter (Signed)
Pt is calling because the doctor had to write a letter saying that she is disabled so that she can file for Medicaid. Medicaid received the letter and denied her saying that is not being disabled. SHe also wants to know what can be done. Her BP medication was recalled and would like to know what she will be taking now and if the doctor called something in already. jw

## 2017-03-24 NOTE — Telephone Encounter (Signed)
Please tell patient to have pharmacy contact me for the issue with her current BP medication. As to the Medicaid application, please ask the patient if she provided her diagnosis of Mixed Connective Tissue Disease to them in her application.

## 2017-04-09 ENCOUNTER — Emergency Department (HOSPITAL_COMMUNITY): Payer: Medicaid Other

## 2017-04-09 ENCOUNTER — Encounter (HOSPITAL_COMMUNITY): Payer: Self-pay

## 2017-04-09 ENCOUNTER — Other Ambulatory Visit: Payer: Self-pay

## 2017-04-09 ENCOUNTER — Inpatient Hospital Stay (HOSPITAL_COMMUNITY)
Admission: EM | Admit: 2017-04-09 | Discharge: 2017-04-14 | DRG: 546 | Disposition: A | Discharge status: Home / Self Care | Payer: Medicaid Other | Attending: Family Medicine | Admitting: Family Medicine

## 2017-04-09 DIAGNOSIS — F17210 Nicotine dependence, cigarettes, uncomplicated: Secondary | ICD-10-CM | POA: Diagnosis present

## 2017-04-09 DIAGNOSIS — R12 Heartburn: Secondary | ICD-10-CM | POA: Diagnosis not present

## 2017-04-09 DIAGNOSIS — Z6841 Body Mass Index (BMI) 40.0 and over, adult: Secondary | ICD-10-CM | POA: Diagnosis not present

## 2017-04-09 DIAGNOSIS — Z920 Personal history of contraception: Secondary | ICD-10-CM | POA: Diagnosis not present

## 2017-04-09 DIAGNOSIS — Z8249 Family history of ischemic heart disease and other diseases of the circulatory system: Secondary | ICD-10-CM

## 2017-04-09 DIAGNOSIS — M940 Chondrocostal junction syndrome [Tietze]: Secondary | ICD-10-CM | POA: Diagnosis present

## 2017-04-09 DIAGNOSIS — M25561 Pain in right knee: Secondary | ICD-10-CM | POA: Diagnosis present

## 2017-04-09 DIAGNOSIS — Z885 Allergy status to narcotic agent status: Secondary | ICD-10-CM

## 2017-04-09 DIAGNOSIS — R197 Diarrhea, unspecified: Secondary | ICD-10-CM | POA: Diagnosis present

## 2017-04-09 DIAGNOSIS — Z8489 Family history of other specified conditions: Secondary | ICD-10-CM

## 2017-04-09 DIAGNOSIS — L0390 Cellulitis, unspecified: Secondary | ICD-10-CM | POA: Diagnosis not present

## 2017-04-09 DIAGNOSIS — Z79899 Other long term (current) drug therapy: Secondary | ICD-10-CM

## 2017-04-09 DIAGNOSIS — B373 Candidiasis of vulva and vagina: Secondary | ICD-10-CM | POA: Diagnosis not present

## 2017-04-09 DIAGNOSIS — M351 Other overlap syndromes: Secondary | ICD-10-CM | POA: Diagnosis present

## 2017-04-09 DIAGNOSIS — I10 Essential (primary) hypertension: Secondary | ICD-10-CM

## 2017-04-09 DIAGNOSIS — Z9114 Patient's other noncompliance with medication regimen: Secondary | ICD-10-CM | POA: Diagnosis not present

## 2017-04-09 DIAGNOSIS — F17290 Nicotine dependence, other tobacco product, uncomplicated: Secondary | ICD-10-CM | POA: Diagnosis present

## 2017-04-09 DIAGNOSIS — N179 Acute kidney failure, unspecified: Secondary | ICD-10-CM | POA: Diagnosis not present

## 2017-04-09 DIAGNOSIS — R109 Unspecified abdominal pain: Secondary | ICD-10-CM

## 2017-04-09 DIAGNOSIS — M329 Systemic lupus erythematosus, unspecified: Principal | ICD-10-CM

## 2017-04-09 DIAGNOSIS — E669 Obesity, unspecified: Secondary | ICD-10-CM | POA: Diagnosis present

## 2017-04-09 DIAGNOSIS — L930 Discoid lupus erythematosus: Secondary | ICD-10-CM

## 2017-04-09 LAB — CBC
HCT: 40.8 % (ref 36.0–46.0)
Hemoglobin: 13.2 g/dL (ref 12.0–15.0)
MCH: 30.8 pg (ref 26.0–34.0)
MCHC: 32.4 g/dL (ref 30.0–36.0)
MCV: 95.1 fL (ref 78.0–100.0)
Platelets: 169 10*3/uL (ref 150–400)
RBC: 4.29 MIL/uL (ref 3.87–5.11)
RDW: 13.3 % (ref 11.5–15.5)
WBC: 9.4 10*3/uL (ref 4.0–10.5)

## 2017-04-09 LAB — MRSA PCR SCREENING: MRSA by PCR: NEGATIVE

## 2017-04-09 LAB — BASIC METABOLIC PANEL
Anion gap: 9 (ref 5–15)
BUN: 7 mg/dL (ref 6–20)
CO2: 22 mmol/L (ref 22–32)
Calcium: 9.1 mg/dL (ref 8.9–10.3)
Chloride: 104 mmol/L (ref 101–111)
Creatinine, Ser: 0.79 mg/dL (ref 0.44–1.00)
GFR calc Af Amer: >60 mL/min (ref >60)
GFR calc non Af Amer: >60 mL/min (ref >60)
Glucose, Bld: 100 mg/dL — ABNORMAL HIGH (ref 65–99)
Potassium: 3.9 mmol/L (ref 3.5–5.1)
Sodium: 135 mmol/L (ref 135–145)

## 2017-04-09 LAB — I-STAT TROPONIN, ED: Troponin i, poc: 0.02 ng/mL (ref 0.00–0.08)

## 2017-04-09 LAB — TROPONIN I: Troponin I: <0.03 ng/mL (ref <0.03)

## 2017-04-09 LAB — I-STAT BETA HCG BLOOD, ED (MC, WL, AP ONLY): I-stat hCG, quantitative: <5 m[IU]/mL (ref <5)

## 2017-04-09 LAB — SEDIMENTATION RATE: Sed Rate: 16 mm/hr (ref 0–22)

## 2017-04-09 LAB — C-REACTIVE PROTEIN: CRP: 0.9 mg/dL (ref <1.0)

## 2017-04-09 MED ORDER — PIPERACILLIN-TAZOBACTAM 3.375 G IVPB 30 MIN
3.3750 g | Freq: Once | INTRAVENOUS | Status: AC
  Administered 2017-04-09: 3.375 g via INTRAVENOUS
  Filled 2017-04-09: qty 50

## 2017-04-09 MED ORDER — PIPERACILLIN-TAZOBACTAM 3.375 G IVPB
3.3750 g | Freq: Three times a day (TID) | INTRAVENOUS | Status: DC
  Administered 2017-04-09 – 2017-04-12 (×8): 3.375 g via INTRAVENOUS
  Filled 2017-04-09 (×11): qty 50

## 2017-04-09 MED ORDER — CLINDAMYCIN PHOSPHATE 600 MG/50ML IV SOLN
600.0000 mg | Freq: Three times a day (TID) | INTRAVENOUS | Status: DC
  Administered 2017-04-09 – 2017-04-12 (×8): 600 mg via INTRAVENOUS
  Filled 2017-04-09 (×9): qty 50

## 2017-04-09 MED ORDER — LISINOPRIL 40 MG PO TABS
40.0000 mg | ORAL_TABLET | Freq: Every day | ORAL | Status: DC
  Administered 2017-04-09 – 2017-04-10 (×2): 40 mg via ORAL
  Filled 2017-04-09: qty 2

## 2017-04-09 MED ORDER — HYDROMORPHONE HCL 1 MG/ML IJ SOLN
1.0000 mg | Freq: Once | INTRAMUSCULAR | Status: AC
  Administered 2017-04-09: 1 mg via INTRAVENOUS
  Filled 2017-04-09: qty 1

## 2017-04-09 MED ORDER — HYDROCHLOROTHIAZIDE 25 MG PO TABS
25.0000 mg | ORAL_TABLET | Freq: Every day | ORAL | Status: DC
  Filled 2017-04-09: qty 1

## 2017-04-09 MED ORDER — VANCOMYCIN HCL IN DEXTROSE 1-5 GM/200ML-% IV SOLN
1000.0000 mg | Freq: Once | INTRAVENOUS | Status: AC
  Administered 2017-04-09: 1000 mg via INTRAVENOUS
  Filled 2017-04-09: qty 200

## 2017-04-09 MED ORDER — HYDRALAZINE HCL 20 MG/ML IJ SOLN
10.0000 mg | INTRAMUSCULAR | Status: DC | PRN
Start: 2017-04-09 — End: 2017-04-14

## 2017-04-09 MED ORDER — CLINDAMYCIN PHOSPHATE 600 MG/50ML IV SOLN
600.0000 mg | Freq: Once | INTRAVENOUS | Status: AC
  Administered 2017-04-09: 600 mg via INTRAVENOUS
  Filled 2017-04-09: qty 50

## 2017-04-09 MED ORDER — PREDNISONE 10 MG PO TABS
50.0000 mg | ORAL_TABLET | Freq: Every day | ORAL | Status: DC
  Administered 2017-04-10 – 2017-04-13 (×4): 50 mg via ORAL
  Filled 2017-04-09: qty 1
  Filled 2017-04-09 (×3): qty 2
  Filled 2017-04-09: qty 1

## 2017-04-09 MED ORDER — LISINOPRIL 20 MG PO TABS
40.0000 mg | ORAL_TABLET | Freq: Every day | ORAL | Status: DC
  Filled 2017-04-09: qty 2

## 2017-04-09 MED ORDER — HYDROMORPHONE HCL 1 MG/ML IJ SOLN
1.0000 mg | INTRAMUSCULAR | Status: DC | PRN
  Administered 2017-04-09 – 2017-04-12 (×18): 1 mg via INTRAVENOUS
  Filled 2017-04-09 (×18): qty 1

## 2017-04-09 MED ORDER — KETOROLAC TROMETHAMINE 15 MG/ML IJ SOLN
15.0000 mg | Freq: Three times a day (TID) | INTRAMUSCULAR | Status: DC
  Administered 2017-04-09 – 2017-04-11 (×5): 15 mg via INTRAVENOUS
  Filled 2017-04-09 (×5): qty 1

## 2017-04-09 MED ORDER — ONDANSETRON HCL 4 MG/2ML IJ SOLN
4.0000 mg | Freq: Four times a day (QID) | INTRAMUSCULAR | Status: DC | PRN
Start: 2017-04-09 — End: 2017-04-09
  Administered 2017-04-09: 4 mg via INTRAVENOUS
  Filled 2017-04-09 (×2): qty 2

## 2017-04-09 MED ORDER — VANCOMYCIN HCL IN DEXTROSE 1-5 GM/200ML-% IV SOLN
1000.0000 mg | Freq: Three times a day (TID) | INTRAVENOUS | Status: DC
  Administered 2017-04-09 – 2017-04-11 (×5): 1000 mg via INTRAVENOUS
  Filled 2017-04-09 (×9): qty 200

## 2017-04-09 MED ORDER — HYDROCHLOROTHIAZIDE 25 MG PO TABS
25.0000 mg | ORAL_TABLET | Freq: Every day | ORAL | Status: DC
  Administered 2017-04-09 – 2017-04-10 (×2): 25 mg via ORAL
  Filled 2017-04-09: qty 1

## 2017-04-09 MED ORDER — PIPERACILLIN-TAZOBACTAM 3.375 G IVPB
3.3750 g | Freq: Three times a day (TID) | INTRAVENOUS | Status: DC
  Filled 2017-04-09: qty 50

## 2017-04-09 MED ORDER — HYDROMORPHONE HCL 1 MG/ML IJ SOLN
1.0000 mg | Freq: Once | INTRAMUSCULAR | Status: AC
Start: 2017-04-09 — End: 2017-04-09
  Administered 2017-04-09: 1 mg via INTRAVENOUS
  Filled 2017-04-09: qty 1

## 2017-04-09 NOTE — ED Notes (Addendum)
Pt noted to have swelling, redness, and heat to her right leg starting at her knee. Pt states that it started yesterday and has progressively gotten worse and increased in pain.

## 2017-04-09 NOTE — ED Provider Notes (Signed)
[place] DEPT Provider Note   CSN: [id number] Arrival date & time: 04/09/17  0031   By signing my name below, I, Eunice Blase, attest that this documentation has been prepared under the direction and in the presence of Jola Schmidt, MD. Electronically signed, Eunice Blase, ED Scribe. 04/09/17. 3:36 AM.   History   Chief Complaint Chief Complaint  Patient presents with  . Chest Pain  . Leg Pain   The history is provided by the patient and medical records. No language interpreter was used.    Linda [name] is a 21 y.o. female with h/o lupus BIB EMS to the Emergency Department concerning constant R knee pain x ~24-36 hours. Associated swelling and heat to affected area and gait problem. Pt describes severe, constant, throbbing and aching worse with movement of the leg and contact with the area. Pt seen 03/22/2017 for similar symptoms. H/o similar symptoms noted related to lupus. No h/o blood clots. No PCP. No known trauma/ injury to the knee. No SOB or chest pain; she states this subsided en route with EMS after 2 doses of nitroglycerine. No other complaints at this time.   Past Medical History:  Diagnosis Date  . Lupus   . Migraines   . Mixed connective tissue disease [facility])     Patient Active Problem List   Diagnosis Date Noted  . Hypertension 12/11/2016  . Mixed connective tissue disease ([place]) 02/11/2016  . High BMI 01/19/2014  . Implanon in place 01/19/2014    Past Surgical History:  Procedure Laterality Date  . MULTIPLE TOOTH EXTRACTIONS      OB History    Gravida Para Term Preterm AB Living   0 0 0 0 0 0   SAB TAB Ectopic Multiple Live Births   0 0 0 0         Home Medications    Prior to Admission medications   Medication Sig Start Date End Date Taking? Authorizing Provider  acetaminophen (TYLENOL) 500 MG tablet Take 1 tablet (500 mg total) by mouth every 6 (six) hours as needed. 10/12/16   Horton, Barbette Hair, MD  etonogestrel (NEXPLANON) 68 MG  IMPL implant 1 each by Subdermal route once.    [provider]  fluconazole (DIFLUCAN) 200 MG tablet 1 by mouth now. Repeat 1 in 7 days. Patient not taking: Reported on 10/12/2016 06/27/16   Tanna Furry, MD  hydrochlorothiazide (HYDRODIURIL) 25 MG tablet Take 1 tablet (25 mg total) by mouth daily. 12/09/16   Mikell, Jeani Sow, MD  lisinopril (PRINIVIL,ZESTRIL) 40 MG tablet Take 1 tablet (40 mg total) by mouth daily. 12/23/16   Mikell, Jeani Sow, MD  predniSONE (DELTASONE) 20 MG tablet Take 3 tablets for 7 days 12/09/16   Tonette Bihari, MD    Family History Family History  Problem Relation Age of Onset  . Lupus Mother   . Hypertension Mother   . Kidney disease Mother   . Diabetes Maternal Grandmother     Social History Social History  Substance Use Topics  . Smoking status: Current Every Day Smoker    Packs/day: 0.00    Types: Cigarettes, Cigars    Start date: 02/11/2015  . Smokeless tobacco: Never Used  . Alcohol use No     Comment: social     Allergies   Vicodin [hydrocodone-acetaminophen]   Review of Systems Review of Systems  Constitutional: Positive for fever (warmth of skin). Negative for diaphoresis.  Respiratory: Negative for shortness of breath.   Cardiovascular: Negative  for chest pain.  Gastrointestinal: Negative for abdominal pain, nausea and vomiting.  Musculoskeletal: Positive for arthralgias, gait problem and joint swelling.  Skin: Negative for color change and wound.  Neurological: Negative for weakness and numbness.  All other systems reviewed and are negative.    Physical Exam Updated Vital Signs BP (!) 174/124 (BP Location: Right Arm)   Pulse 81   Temp 98.4 F (36.9 C) (Oral)   Resp 18   SpO2 100%   Physical Exam  Constitutional: She is oriented to person, place, and time. She appears well-developed and well-nourished.  Uncomfortable appearing  HENT:  Head: Normocephalic and atraumatic.  Eyes: EOM are normal.  Neck:  Normal range of motion.  Cardiovascular: Normal rate, regular rhythm and normal heart sounds.   Pulmonary/Chest: Effort normal and breath sounds normal.  Abdominal: Soft. She exhibits no distension. There is no tenderness.  Musculoskeletal: Normal range of motion.  Erythema and warmth and diffuse tenderness of her anterior knee and more specifically near the quad tendon.  Normal pulses distally.  Compartments below and above are soft.  Painful range of motion the right knee.  Neurological: She is alert and oriented to person, place, and time.  Skin: Skin is warm and dry.  Psychiatric: She has a normal mood and affect. Judgment normal.  Nursing note and vitals reviewed.    ED Treatments / Results  DIAGNOSTIC STUDIES: Oxygen Saturation is 100% on RA, NL by my interpretation.    COORDINATION OF CARE: 3:34 AM-Discussed next steps with pt. Pt verbalized understanding and is agreeable with the plan. Will order medications.   Labs (all labs ordered are listed, but only abnormal results are displayed) Labs Reviewed  BASIC METABOLIC PANEL - Abnormal; Notable for the following:       Result Value   Glucose, Bld 100 (*)    All other components within normal limits  CBC  C-REACTIVE PROTEIN  SEDIMENTATION RATE  I-STAT TROPONIN, ED  I-STAT BETA HCG BLOOD, ED ([facility], [facility], [facility] ONLY)    EKG  EKG Interpretation  Date/Time:  Friday April 09 2017 00:34:08 EDT Ventricular Rate:  89 PR Interval:  144 QRS Duration: 82 QT Interval:  388 QTC Calculation: 472 R Axis:   41 Text Interpretation:  Normal sinus rhythm Biatrial enlargement Nonspecific T wave abnormality Prolonged QT Abnormal ECG No old tracing to compare Confirmed by Jola Schmidt [id number]) on 04/09/2017 3:28:59 AM Also confirmed by Jola Schmidt [phone]), editor Hattie Perch ([id number])  on 04/09/2017 7:33:38 AM       Radiology Dg Chest 2 View  Result Date: 04/09/2017 CLINICAL DATA:  Chest pain and dyspnea.  History of lupus. EXAM:  CHEST  2 VIEW COMPARISON:  12/24/2015 FINDINGS: The heart size and mediastinal contours are within normal limits. Both lungs are clear. The visualized skeletal structures are unremarkable. IMPRESSION: No active cardiopulmonary disease. Electronically Signed   By: Ashley Royalty M.D.   On: 04/09/2017 01:21    Procedures Procedures (including critical care time)  Medications Ordered in ED Medications  ondansetron (ZOFRAN) injection 4 mg (not administered)  HYDROmorphone (DILAUDID) injection 1 mg (1 mg Intravenous Given 04/09/17 0349)  HYDROmorphone (DILAUDID) injection 1 mg (1 mg Intravenous Given 04/09/17 7048)     Initial Impression / Assessment and Plan / ED Course  I have reviewed the triage vital signs and the nursing notes.  Pertinent labs & imaging results that were available during my care of the patient were reviewed by me and considered in my medical  decision making (see chart for details).    Patient with what appears to be an infectious process about the anterior right knee and distal right anterior thigh.  Patient will undergo MRI for further evaluation.  Labs, sedimentation rate, CRP.  Care to Dr Dolly Rias to follow up on MRI  Final Clinical Impressions(s) / ED Diagnoses   Final diagnoses:  None    New Prescriptions New Prescriptions   No medications on file   I personally performed the services described in this documentation, which was scribed in my presence. The recorded information has been reviewed and is accurate.        Jola Schmidt, MD 04/09/17 [phone]

## 2017-04-09 NOTE — ED Notes (Signed)
C/o right  Knee swelling, painful to touch- warm to touch-- positive pedal pulses.

## 2017-04-09 NOTE — ED Provider Notes (Signed)
9:23 AM Assumed care from Dr. Venora Maples, please see their note for full history, physical and decision making until this point. In brief this is a 21 y.o. year old female who presented to the ED tonight with Chest Pain and Leg Pain     21 year old female here with right distal femur pain. Has warmth, tenderness and swelling to that area. This pending MRI  My exam patient with worsening pain to this. Does have pain that extends beyond the borders of the obvious cellulitis. MRI concern for gas-forming organisms and vancomycin, Zosyn and clindamycin were started. Orthopedics consult.  Orthopedics felt like this is a general surgery problem so I will consult Gen. Surgery. Discussed with family medicine who will admit for IV antibiotics but still would like surgical evaluation. General surgery feels this is more orthopedic and request them to see. We'll repeat orthopedics. Dr. Stann Mainland with ortho will see.    Labs, studies and imaging reviewed by myself and considered in medical decision making if ordered. Imaging interpreted by radiology.  Labs Reviewed  BASIC METABOLIC PANEL - Abnormal; Notable for the following:       Result Value   Glucose, Bld 100 (*)    All other components within normal limits  CBC  C-REACTIVE PROTEIN  SEDIMENTATION RATE  I-STAT TROPONIN, ED  I-STAT BETA HCG BLOOD, ED ([facility], [facility], [facility] ONLY)    MR KNEE RIGHT WO CONTRAST  Final Result    DG Chest 2 View  Final Result    DG Knee Complete 4 Views Right    (Results Pending)    No Follow-up on file.    Merrily Pew, MD 04/12/17 [phone]

## 2017-04-09 NOTE — Consult Note (Signed)
Reason for Consult: Acute R knee pain with concerns for necrotizing fasciitis Referring Physician: Kandis Mannan, MD  Linda [name] is an 21 y.o. female.  HPI: The patient is a 21 year old female with a PMH significant for lupus and mixed connective tissue disease, that presented to the [facility] ED on 04/09/17 with concerns over R knee pain and swelling that began last night.  She reports that she went to bed and her knee felt sore.  When she woke up this morning she had increased pain and swelling.  Upon arrival to the ED she was placed on antibiotics, had an x-ray, and MRI of her R knee.  The patient is to be admitted to the Medicine service.  She denies fever, chills, N/V, drainage, or any new open wounds.  She reports that her knee is very sensitive to light touch and movement.  Weightbearing exacerbates her symptoms while nonweightbearing and immobilization alleviate her symptoms.  She describes an intense sharp throbby sensation.  She notes that her last lupus flare up was around the 4th of July.  She admits to getting into the ocean with a new wound around her posterior lower leg that seemed to heal without difficulty.  She reports that her lupus symptoms typically come on gradually and last for about 3-4 weeks.  She states there her symptoms have never come on this abruptly previously.    Past Medical History:  Diagnosis Date  . Lupus   . Migraines   . Mixed connective tissue disease [facility])     Past Surgical History:  Procedure Laterality Date  . MULTIPLE TOOTH EXTRACTIONS      Family History  Problem Relation Age of Onset  . Lupus Mother   . Hypertension Mother   . Kidney disease Mother   . Diabetes Maternal Grandmother     Social History:  reports that she has been smoking Cigarettes and Cigars.  She started smoking about 2 years ago. She has been smoking about 0.00 packs per day. She has never used smokeless tobacco. She reports that she does not drink alcohol or use  drugs.  Allergies:  Allergies  Allergen Reactions  . Vicodin [Hydrocodone-Acetaminophen] Itching and Rash    Medications: I have reviewed the patient's current medications.  Results for orders placed or performed during the hospital encounter of 04/09/17 (from the past 48 hour(s))  Basic metabolic panel     Status: Abnormal   Collection Time: 04/09/17 12:44 AM  Result Value Ref Range   Sodium 135 135 - 145 mmol/L   Potassium 3.9 3.5 - 5.1 mmol/L   Chloride 104 101 - 111 mmol/L   CO2 22 22 - 32 mmol/L   Glucose, Bld 100 (H) 65 - 99 mg/dL   BUN 7 6 - 20 mg/dL   Creatinine, Ser 4.47 0.44 - 1.00 mg/dL   Calcium 9.1 8.9 - 39.5 mg/dL   GFR calc non Af Amer >60 >60 mL/min   GFR calc Af Amer >60 >60 mL/min    Comment: (NOTE) The eGFR has been calculated using the CKD EPI equation. This calculation has not been validated in all clinical situations. eGFR's persistently <60 mL/min signify possible Chronic Kidney Disease.    Anion gap 9 5 - 15  CBC     Status: None   Collection Time: 04/09/17 12:44 AM  Result Value Ref Range   WBC 9.4 4.0 - 10.5 K/uL   RBC 4.29 3.87 - 5.11 MIL/uL   Hemoglobin 13.2 12.0 -  15.0 g/dL   HCT 40.8 36.0 - 46.0 %   MCV 95.1 78.0 - 100.0 fL   MCH 30.8 26.0 - 34.0 pg   MCHC 32.4 30.0 - 36.0 g/dL   RDW 13.3 11.5 - 15.5 %   Platelets 169 150 - 400 K/uL  I-stat troponin, ED     Status: None   Collection Time: 04/09/17 12:55 AM  Result Value Ref Range   Troponin i, poc 0.02 0.00 - 0.08 ng/mL   Comment 3            Comment: Due to the release kinetics of cTnI, a negative result within the first hours of the onset of symptoms does not rule out myocardial infarction with certainty. If myocardial infarction is still suspected, repeat the test at appropriate intervals.   I-Stat Beta hCG blood, ED ([facility], [facility], [facility] only)     Status: None   Collection Time: 04/09/17 12:55 AM  Result Value Ref Range   I-stat hCG, quantitative <5.0 <5 mIU/mL   Comment 3             Comment:   GEST. AGE      CONC.  (mIU/mL)   <=1 WEEK        5 - 50     2 WEEKS       50 - 500     3 WEEKS       100 - 10,000     4 WEEKS     1,000 - 30,000        FEMALE AND NON-PREGNANT FEMALE:     LESS THAN 5 mIU/mL   C-reactive protein     Status: None   Collection Time: 04/09/17  3:55 AM  Result Value Ref Range   CRP 0.9 <1.0 mg/dL  Sedimentation rate     Status: None   Collection Time: 04/09/17  3:55 AM  Result Value Ref Range   Sed Rate 16 0 - 22 mm/hr    Dg Chest 2 View  Result Date: 04/09/2017 CLINICAL DATA:  Chest pain and dyspnea.  History of lupus. EXAM: CHEST  2 VIEW COMPARISON:  12/24/2015 FINDINGS: The heart size and mediastinal contours are within normal limits. Both lungs are clear. The visualized skeletal structures are unremarkable. IMPRESSION: No active cardiopulmonary disease. Electronically Signed   By: Ashley Royalty M.D.   On: 04/09/2017 01:21   Mr Knee Right Wo Contrast  Result Date: 04/09/2017 CLINICAL DATA:  Right knee swelling and redness. EXAM: MRI OF THE RIGHT KNEE WITHOUT CONTRAST TECHNIQUE: Multiplanar, multisequence MR imaging of the knee was performed. No intravenous contrast was administered. COMPARISON:  None. FINDINGS: MENISCI Medial meniscus:  Intact and normal morphology. Lateral meniscus:  Intact and normal morphology. LIGAMENTS Cruciates:  Intact ACL and PCL. Collaterals: Medial collateral ligament is intact. Lateral collateral ligament complex is intact. CARTILAGE Patellofemoral:  Normal. Medial:  Normal. Lateral:  Normal. Joint:  No joint effusion. Normal Hoffa's fat. No plical thickening. Popliteal Fossa:  No Baker cyst. Intact popliteus tendon. Extensor Mechanism:  Intact quadriceps tendon and patellar tendon. Bones: No focal marrow signal abnormality. No fracture or dislocation. Other: There is ill definition of and increased signal within the medial patellofemoral ligament. There is prominent soft tissue edema and fluid along the anterior knee, with  multiple scattered foci of T1 and T2 hypointense signal, concerning for gas. There is edema involving the distal vastus lateralis and vastus medialis muscles. IMPRESSION: 1. Prominent soft tissue edema and fluid anterior to  the knee, with scattered foci of T1 and T2 hypointensity, concerning for subcutaneous emphysema, raising the possibility of gas-forming organisms. Clinical correlation for necrotizing fasciitis is recommended. 2. Fluid and inflammation extends into the distal aspects of the vastus lateralis and vastus medialis muscles. There is also involvement of the medial patellofemoral ligament, which may be partially torn as a result. 3. No knee joint effusion.  No evidence of internal derangement. Critical Value/emergent results were called by telephone at the time of interpretation on 04/09/2017 at 8:32 am to Dr. Dolly Rias, MD, who verbally acknowledged these results. Electronically Signed   By: Titus Dubin M.D.   On: 04/09/2017 08:38   Dg Knee Complete 4 Views Right  Result Date: 04/09/2017 CLINICAL DATA:  Lupus.  Mixed connective tissue disease. EXAM: RIGHT KNEE - COMPLETE 4+ VIEW COMPARISON:  MRI from earlier today. FINDINGS: There is extensive sheet like and nodular calcification along the extensor knee, extending from lower quadriceps to the upper shin. No soft tissue emphysema. Negative for joint effusion, bone erosion, or fracture deformity. No degenerative changes. IMPRESSION: Extensive dystrophic type calcifications along the extensor knee, correlating with history of mixed connective tissue disease. No soft tissue emphysema. Electronically Signed   By: Monte Fantasia M.D.   On: 04/09/2017 09:25    ROS:  Gen: Denies fever, chills, weight change, fatigue, night sweats MSK: C/o R knee pain and swelling with movement and weightbearing Neuro: Denies headache, numbness, weakness, slurred speech, loss of memory or consciousness Derm: Denies rash, dry skin, scaling or peeling skin  change HEENT: Denies blurred vision, double vision, neck stiffness, dysphagia PULM: Denies shortness of breath, cough, sputum production, hemoptysis, wheezing CV: Denies chest pain, edema, orthopnea, palpitations GI: Denies abdominal pain, nausea, vomiting, diarrhea, hematochezia, melena, constipation  Endocrine: Denies hot or cold intolerance, polyuria, polyphagia or appetite change Heme: Denies easy bruising, bleeding, bleeding gums  PE:  Blood pressure (!) 163/120, pulse 90, temperature 98.9 F (37.2 C), temperature source Oral, resp. rate (!) 22, weight 102.5 kg (226 lb), last menstrual period 03/19/2017, SpO2 99 %. General: WDWN patient in NAD. Psych:  Appropriate mood and affect. Neuro:  A&O x 3, Moving all extremities, sensation intact to light touch however she is extremely hypersensitive. HEENT:  EOMs intact Chest:  Even non-labored respirations Skin:  C/D/I, no rashes.  There are nodules consistent with lupus about the R LE.  No new blisters consistent with necrotizing fasciitis noted on examination. Extremities: warm/dry, moderate edema about her R knee, no erythema or echymosis.  No lymphadenopathy. Pulses: Dorsalis pedis and post tibialis 2+ MSK:  Tender to palpation at anterior knee superficially diffusely from about 4 inches proximal to 3 inches inferior to the joint line.  ROM: full ankle ROM.  R knee ROM is limited from 5-30 degrees due to pain, MMT: patient is able to perform a quad set, (-) Homan's   Assessment/Plan: Acute R knee pain and swelling  I have reviewed the MRI and radiographic findings.  There is no evidence of free air apparent on knee radiographs, and the patient does not have any new open wounds or blisters. Labs, including inflammatory markers, are WNL.  This makes the likelihood of necrotizing fasciitis low on the differential.  This is likely an acute flare up of her lupus versus cellulitis.  Neither of which is a surgical indication.  Recommend a short  course of steroids for diagnostic and therapeutic purposes.  ABX per medicine team.  The NPO status can be removed as far  as ortho is concerned.  She can be WBAT on R LE.  Ortho will sign off.   Mechele Claude, PA-C [facility] Orthopaedics Office:  [phone]

## 2017-04-09 NOTE — Progress Notes (Signed)
ANTIBIOTIC CONSULT NOTE - INITIAL  Pharmacy Consult for Vancomycin and zosyn Indication: cellulitis  Allergies  Allergen Reactions  . Vicodin [Hydrocodone-Acetaminophen] Itching and Rash   Assessment: 60 yof presenting with right knee swelling. MRI R knee showing prominent soft tissue edema and fluid concerning for subQ emphysema. Started on concurrent clindamycin due to concern for necrotizing fasciitis.   Goal of Therapy:  Vancomycin trough level 15-20 mcg/ml  Plan:  Vancomycin 1g every 8 hours  Zosyn 3.375g every 8 hours (infuse over 4 hours) Check vancomycin trough prior to the 4th dose Monitor renal function, culture results, and clinical course  Patient Measurements: Weight: 226 lb (102.5 kg) Adjusted Body Weight: 76.6  Vital Signs: Temp: 98.9 F (37.2 C) (08/10 0840) Temp Source: Oral (08/10 0840) BP: 145/114 (08/10 1345) Pulse Rate: 90 (08/10 1345) Intake/Output from previous day: No intake/output data recorded. Intake/Output from this shift: No intake/output data recorded.  Labs:  Recent Labs  04/09/17 0044  WBC 9.4  HGB 13.2  PLT 169  CREATININE 0.79   Estimated Creatinine Clearance: 134.5 mL/min (by C-G formula based on SCr of 0.79 mg/dL). No results for input(s): VANCOTROUGH, VANCOPEAK, VANCORANDOM, GENTTROUGH, GENTPEAK, GENTRANDOM, TOBRATROUGH, TOBRAPEAK, TOBRARND, AMIKACINPEAK, AMIKACINTROU, AMIKACIN in the last 72 hours.   Microbiology: No results found for this or any previous visit (from the past 720 hour(s)).  Medical History: Past Medical History:  Diagnosis Date  . Lupus   . Migraines   . Mixed connective tissue disease (HCC)     Medications:  Scheduled:  . hydrochlorothiazide  25 mg Oral Daily  . lisinopril  40 mg Oral Daily    Betsey Holiday 04/09/2017,2:11 PM

## 2017-04-09 NOTE — ED Triage Notes (Signed)
Pt here for chest pain, rating 9/10, increased blood pressure, onset tonight at 6 pm,. Pt hx of lupus, pt has IV, pt given 2 ntg enroute and pain relieved in chest, bp is still elevated

## 2017-04-09 NOTE — Progress Notes (Signed)
Family Medicine Teaching Service Daily Progress Note Intern Pager: [phone]  Patient name: Linda [name] Medical record number: [id number] Date of birth: [birth year] Age: 21 y.o. Gender: female  Primary Care Provider: Tonette Bihari, MD Consultants: Ortho  Code Status: Full   Pt Overview and Major Events to Date:   Assessment and Plan:  Linda [name] is a 21 y.o. female presenting with right knee pain and swelling . PMH is significant for lupus (not on medications), connective tissue disease (not on medications), HTN, Obesity.  Right Knee Pain/Swelling: Infection vs. Autoimmune infection; MRI R knee with prominent soft tissue edema and fluid anterior to the knee,some concerning for subcutaneous emphysema. Right Knee x-ray with extensive dystrophic type calcifications along the extensor knee, correlating with history of mixed connective tissue disease. Per ortho no concern for infections process, including necrotizing fascitis - most concerning for autoimmune given patient's lack of WBC and fever.  - Transfer to med-surg  - Could consider 48 hours of vancomycin and zosyn, and clindamycin, per ortho no concern for nec fasc - Started prednisone 50 mg for possible autoimmune process  - Toradol for pain day 2 of 5  - blood culture (after abx) pending   HTN: Non-compliant with blood pressure medication. Kidney function normal, UA wnl. Blood pressure normotensive this AM  - Could consider further work up outpatient  - HCTZ and lisinopril   Resolved Chest Pain: episode of chest pain in ambulance. Relieved with nitroglycerin. No cardiac history. EKG without significant ST or T wave changes. Istat trop 0.04. Possibly anxiety related. Second troponin negative   FEN/GI: Regular Diet Prophylaxis: Lovenox   Disposition: Discharge following pain control, social consulted for patient's issues with insurance   Subjective:  Patient's knee is still quite painful this morning with  movement. Otherwise she states that she is overall doing well. She denies any chest pain nausea or vomiting. She her vitals have been stable she has been afebrile  Objective: Temp:  [98.4 F (36.9 C)-98.9 F (37.2 C)] 98.7 F (37.1 C) (08/10 2040) Pulse Rate:  [81-100] 87 (08/10 2040) Resp:  [17-22] 17 (08/10 2040) BP: (141-206)/(87-141) 145/96 (08/10 2040) SpO2:  [95 %-100 %] 97 % (08/10 2040) Weight:  [226 lb (102.5 kg)] 226 lb (102.5 kg) (08/10 1345) Physical Exam: General: Well appearing young woman, NAD Cardiovascular: Regular rate rhythm, no murmurs Respiratory: CTA B Abdomen: Bowel sounds present, NDNT Extremities: Right knee significantly swollen compared to left knee, not warm to the touch compared to left knee, significant pain with palpation of right knee, patient unable to move right knee due to pain therefore range of motion was limited, pulses intact, no changes in sensation  Laboratory:  Recent Labs Lab 04/09/17 0044  WBC 9.4  HGB 13.2  HCT 40.8  PLT 169    Recent Labs Lab 04/09/17 0044  NA 135  K 3.9  CL 104  CO2 22  BUN 7  CREATININE 0.79  CALCIUM 9.1  GLUCOSE 100*     Imaging/Diagnostic Tests: Dg Chest 2 View  Result Date: 04/09/2017 CLINICAL DATA:  Chest pain and dyspnea.  History of lupus. EXAM: CHEST  2 VIEW COMPARISON:  12/24/2015 FINDINGS: The heart size and mediastinal contours are within normal limits. Both lungs are clear. The visualized skeletal structures are unremarkable. IMPRESSION: No active cardiopulmonary disease. Electronically Signed   By: Ashley Royalty M.D.   On: 04/09/2017 01:21   Mr Knee Right Wo Contrast  Result Date: 04/09/2017 CLINICAL DATA:  Right knee  swelling and redness. EXAM: MRI OF THE RIGHT KNEE WITHOUT CONTRAST TECHNIQUE: Multiplanar, multisequence MR imaging of the knee was performed. No intravenous contrast was administered. COMPARISON:  None. FINDINGS: MENISCI Medial meniscus:  Intact and normal morphology. Lateral  meniscus:  Intact and normal morphology. LIGAMENTS Cruciates:  Intact ACL and PCL. Collaterals: Medial collateral ligament is intact. Lateral collateral ligament complex is intact. CARTILAGE Patellofemoral:  Normal. Medial:  Normal. Lateral:  Normal. Joint:  No joint effusion. Normal Hoffa's fat. No plical thickening. Popliteal Fossa:  No Baker cyst. Intact popliteus tendon. Extensor Mechanism:  Intact quadriceps tendon and patellar tendon. Bones: No focal marrow signal abnormality. No fracture or dislocation. Other: There is ill definition of and increased signal within the medial patellofemoral ligament. There is prominent soft tissue edema and fluid along the anterior knee, with multiple scattered foci of T1 and T2 hypointense signal, concerning for gas. There is edema involving the distal vastus lateralis and vastus medialis muscles. IMPRESSION: 1. Prominent soft tissue edema and fluid anterior to the knee, with scattered foci of T1 and T2 hypointensity, concerning for subcutaneous emphysema, raising the possibility of gas-forming organisms. Clinical correlation for necrotizing fasciitis is recommended. 2. Fluid and inflammation extends into the distal aspects of the vastus lateralis and vastus medialis muscles. There is also involvement of the medial patellofemoral ligament, which may be partially torn as a result. 3. No knee joint effusion.  No evidence of internal derangement. Critical Value/emergent results were called by telephone at the time of interpretation on 04/09/2017 at 8:32 am to Dr. Dolly Rias, MD, who verbally acknowledged these results. Electronically Signed   By: Titus Dubin M.D.   On: 04/09/2017 08:38   Dg Knee Complete 4 Views Right  Result Date: 04/09/2017 CLINICAL DATA:  Lupus.  Mixed connective tissue disease. EXAM: RIGHT KNEE - COMPLETE 4+ VIEW COMPARISON:  MRI from earlier today. FINDINGS: There is extensive sheet like and nodular calcification along the extensor knee, extending from  lower quadriceps to the upper shin. No soft tissue emphysema. Negative for joint effusion, bone erosion, or fracture deformity. No degenerative changes. IMPRESSION: Extensive dystrophic type calcifications along the extensor knee, correlating with history of mixed connective tissue disease. No soft tissue emphysema. Electronically Signed   By: Monte Fantasia M.D.   On: 04/09/2017 09:25    Tonette Bihari, MD 04/09/2017, 10:29 PM PGY-3, [facility] Intern pager: [phone], text pages welcome

## 2017-04-09 NOTE — ED Notes (Signed)
Boyfriend at bedside , pain is better

## 2017-04-09 NOTE — Progress Notes (Addendum)
Pt arrived to unit @ approx 1845 from ED. Bedside report received from Maudie Mercury, [place]. Tele monitor placed and CCMD notified of pt admission. Family present at bedside. Oriented to room and unit with understanding verbalized. Alert and oriented x4. Right knee swollen & elevated on pillow. Orders reviewed. Covering provider paged via amion and notified of pt arrival. Bedside report given to oncoming night shift RN, Marolyn Hammock, & pt left in her care at this time.

## 2017-04-09 NOTE — Progress Notes (Signed)
Maudie Mercury, RN in ED attempted to call report for patient. RN assigned to patient was not available, and RN informed Maudie Mercury that RN assigned, Darrick Meigs, would call back in 10-15 min. Kim asked to give report to charge RN, who was also taking report.  I was watching desk and getting report also. I called Kim back at [facility], and could not reach her. Charge RN, Justice Rocher also attempted to call back directly after and was unable to reach [place].  Kim called back and all RNs were admitting other patients.

## 2017-04-09 NOTE — H&P (Signed)
[facility] Admission History and Physical Service Pager: [phone]  Patient name: Linda [name] Medical record number: [id number] Date of birth: 22-Mar-1996 Age: 21 y.o. Gender: female  Primary Care Provider: Tonette Bihari, MD Consultants: surgery  Code Status: FULL  Chief Complaint: right knee pain   Assessment and Plan: Linda [name] is a 21 y.o. female presenting with right knee pain and swelling . PMH is significant for lupus (not on medications), connective tissue disease (not on medications), HTN, Obesity.  Right Knee Pain/Swelling: Acute onset over < 24 hours of severe pain, increased warmth, and swelling without injury. Differentials include cellulitis, with concern for necrotizing fasciitis. MRI R knee with Prominent soft tissue edema and fluid anterior to the knee, with scattered foci of T1 and T2 hypointensity, concerning for subcutaneous emphysema, raising the possibility of gas-forming organisms. Right Knee x-ray with extensive dystrophic type calcifications along the extensor knee, correlating with history of mixed connective tissue disease. No soft tissue emphysema. - admit to step down for close monitoring, attending Dr. Ardelia Mems - continue broad spectrum antibiotics Vancomycin, Zosyn, and Clindamycin  - Dilaudid q3 hr PRN for severe pain - keep NPO until evaluated by surgery (consulted in the ED) - blood culture (after abx)  HTN: elevated BP likely a combination of uncontrolled HTN but pain is likely significantly contributing - Dilaudid 21m q 3 hr PRN  (monitor closely) for pain - restart home medications HCTZ and lisinopril (has not been on meds for past 3 weeks)   Chest Pain: episode of chest pain in ambulance. Relieved with nitroglycerin. No cardiac history. EKG without significant ST or T wave changes. Istat trop 0.04. Possibly anxiety related.  - will get another troponin I     FEN/GI: NPO until surgery evaluates   Prophylaxis: no pharmacologic prophylaxis until seen by surgery   Disposition: admit for IV antibiotics   History of Present Illness:  Linda LOSSis a 21y.o. female presenting with sudden on set right knee pain and swelling. Reports that she had slight pain in her right knee last night but was not concerned about this. However, she woke up this morning with a swollen right knee with severe pain. Reports it is warm to touch, and describes pain as burning/stinging feeling. There is also pain slightly proximal to the knee. She was not able to walk because of the pain. Denies any fevers or chills. No nausea, vomiting. No recent injury. Patient had an episode of chest pain while in the ambulance, where she had central chest pressure with some shortness of breath without diaphoresis or nausea. Reports that EMS gave nitroglycerin which eased it off but gave her a HA.    Review Of Systems:   Review of Systems  Constitutional: Negative for chills, fever and malaise/fatigue.  HENT: Negative for congestion and sore throat.   Eyes: Negative for blurred vision and double vision.  Respiratory: Negative for cough and shortness of breath.   Cardiovascular: Positive for chest pain. Negative for palpitations and leg swelling.  Gastrointestinal: Negative for abdominal pain, constipation, diarrhea, nausea and vomiting.  Genitourinary: Negative for dysuria and frequency.  Musculoskeletal: Positive for joint pain.  Neurological: Negative for dizziness and focal weakness.  Psychiatric/Behavioral: Negative for substance abuse.    Patient Active Problem List   Diagnosis Date Noted  . Cellulitis 04/09/2017  . Hypertension 12/11/2016  . Mixed connective tissue disease ([place] 02/11/2016  . High BMI 01/19/2014  . Implanon in place 01/19/2014  Past Medical History: Past Medical History:  Diagnosis Date  . Lupus   . Migraines   . Mixed connective tissue disease ([place])     Past Surgical  History: Past Surgical History:  Procedure Laterality Date  . MULTIPLE TOOTH EXTRACTIONS      Social History: Social History  Substance Use Topics  . Smoking status: Current Every Day Smoker    Packs/day: 0.00    Types: Cigarettes, Cigars    Start date: 02/11/2015  . Smokeless tobacco: Never Used  . Alcohol use No     Comment: social   Please also refer to relevant sections of EMR.  Family History: Family History  Problem Relation Age of Onset  . Lupus Mother   . Hypertension Mother   . Kidney disease Mother   . Diabetes Maternal Grandmother     Allergies and Medications: Allergies  Allergen Reactions  . Vicodin [Hydrocodone-Acetaminophen] Itching and Rash   Current Facility-Administered Medications on File Prior to Encounter  Medication Dose Route Frequency Provider Last Rate Last Dose  . etonogestrel (IMPLANON) implant 68 mg  68 mg Subcutaneous Once Shelly Bombard, MD       No current outpatient prescriptions on file prior to encounter.    Objective: BP (!) 206/127 (BP Location: Left Arm)   Pulse 98   Temp 98.9 F (37.2 C) (Oral)   Resp (!) 22   LMP 03/19/2017   SpO2 100%  Exam: GEN: NAD HEENT: Atraumatic, normocephalic, neck supple, EOMI, sclera clear  CV: RRR, no murmurs, rubs, or gallops PULM: CTAB, normal effort ABD: Soft, nontender, nondistended, NABS, no organomegaly SKIN: No rash or cyanosis; warm and well-perfused EXTR: No lower extremity edema or calf tenderness PSYCH: Mood and affect euthymic, normal rate and volume of speech NEURO: Awake, alert, no focal deficits grossly, normal speech   Labs and Imaging: CBC BMET   Recent Labs Lab 04/09/17 0044  WBC 9.4  HGB 13.2  HCT 40.8  PLT 169    Recent Labs Lab 04/09/17 0044  NA 135  K 3.9  CL 104  CO2 22  BUN 7  CREATININE 0.79  GLUCOSE 100*  CALCIUM 9.1     EKG: NSR, T wave inversion only on V1. No old EKG to compare Trop: 0.02 CRP 0.09 ESR 16 Beta hcg: <0.5  MRI Right  Knee:  IMPRESSION: 1. Prominent soft tissue edema and fluid anterior to the knee, with scattered foci of T1 and T2 hypointensity, concerning for subcutaneous emphysema, raising the possibility of gas-forming organisms. Clinical correlation for necrotizing fasciitis is recommended. 2. Fluid and inflammation extends into the distal aspects of the vastus lateralis and vastus medialis muscles. There is also involvement of the medial patellofemoral ligament, which may be partially torn as a result. 3. No knee joint effusion.  No evidence of internal derangement. Critical Value/emergent results were called by telephone at the time of interpretation on 04/09/2017 at 8:32 am to Dr. Dolly Rias, MD, who verbally acknowledged these results.  X-ray Right Knee:  IMPRESSION: Extensive dystrophic type calcifications along the extensor knee, correlating with history of mixed connective tissue disease. No soft tissue emphysema.  Smiley Houseman, MD 04/09/2017, 10:11 AM PGY-3, [place] Intern pager: [phone], text pages welcome

## 2017-04-09 NOTE — ED Notes (Signed)
Patient transported to MRI 

## 2017-04-10 DIAGNOSIS — M329 Systemic lupus erythematosus, unspecified: Secondary | ICD-10-CM

## 2017-04-10 DIAGNOSIS — L930 Discoid lupus erythematosus: Secondary | ICD-10-CM

## 2017-04-10 DIAGNOSIS — M359 Systemic involvement of connective tissue, unspecified: Secondary | ICD-10-CM

## 2017-04-10 LAB — CBC
HCT: 37.6 % (ref 36.0–46.0)
Hemoglobin: 12.4 g/dL (ref 12.0–15.0)
MCH: 31 pg (ref 26.0–34.0)
MCHC: 33 g/dL (ref 30.0–36.0)
MCV: 94 fL (ref 78.0–100.0)
Platelets: 159 10*3/uL (ref 150–400)
RBC: 4 MIL/uL (ref 3.87–5.11)
RDW: 12.9 % (ref 11.5–15.5)
WBC: 11.9 10*3/uL — ABNORMAL HIGH (ref 4.0–10.5)

## 2017-04-10 LAB — COMPREHENSIVE METABOLIC PANEL
ALT: 9 U/L — ABNORMAL LOW (ref 14–54)
AST: 19 U/L (ref 15–41)
Albumin: 3.1 g/dL — ABNORMAL LOW (ref 3.5–5.0)
Alkaline Phosphatase: 52 U/L (ref 38–126)
Anion gap: 10 (ref 5–15)
BUN: 6 mg/dL (ref 6–20)
CO2: 24 mmol/L (ref 22–32)
Calcium: 8.8 mg/dL — ABNORMAL LOW (ref 8.9–10.3)
Chloride: 98 mmol/L — ABNORMAL LOW (ref 101–111)
Creatinine, Ser: 1 mg/dL (ref 0.44–1.00)
GFR calc Af Amer: >60 mL/min (ref >60)
GFR calc non Af Amer: >60 mL/min (ref >60)
Glucose, Bld: 119 mg/dL — ABNORMAL HIGH (ref 65–99)
Potassium: 3.7 mmol/L (ref 3.5–5.1)
Sodium: 132 mmol/L — ABNORMAL LOW (ref 135–145)
Total Bilirubin: 1.7 mg/dL — ABNORMAL HIGH (ref 0.3–1.2)
Total Protein: 6.9 g/dL (ref 6.5–8.1)

## 2017-04-10 LAB — URINALYSIS, ROUTINE W REFLEX MICROSCOPIC
Bilirubin Urine: NEGATIVE
Glucose, UA: NEGATIVE mg/dL
Hgb urine dipstick: NEGATIVE
Ketones, ur: NEGATIVE mg/dL
Leukocytes, UA: NEGATIVE
Nitrite: NEGATIVE
Protein, ur: NEGATIVE mg/dL
Specific Gravity, Urine: 1.012 (ref 1.005–1.030)
pH: 5 (ref 5.0–8.0)

## 2017-04-10 LAB — HEMOGLOBIN A1C
Hgb A1c MFr Bld: 4.6 % — ABNORMAL LOW (ref 4.8–5.6)
Mean Plasma Glucose: 85 mg/dL

## 2017-04-10 LAB — HIV ANTIBODY (ROUTINE TESTING W REFLEX): HIV Screen 4th Generation wRfx: NONREACTIVE

## 2017-04-10 MED ORDER — ENOXAPARIN SODIUM 60 MG/0.6ML ~~LOC~~ SOLN
0.5000 mg/kg | SUBCUTANEOUS | Status: DC
  Administered 2017-04-10 – 2017-04-14 (×5): 55 mg via SUBCUTANEOUS
  Filled 2017-04-10 (×5): qty 0.6

## 2017-04-10 MED ORDER — FAMOTIDINE 20 MG PO TABS
20.0000 mg | ORAL_TABLET | Freq: Every day | ORAL | Status: DC | PRN
  Administered 2017-04-10: 20 mg via ORAL
  Filled 2017-04-10 (×2): qty 1

## 2017-04-10 MED ORDER — GI COCKTAIL ~~LOC~~
30.0000 mL | Freq: Two times a day (BID) | ORAL | Status: DC | PRN
Start: 2017-04-10 — End: 2017-04-14
  Administered 2017-04-10 – 2017-04-12 (×4): 30 mL via ORAL
  Filled 2017-04-10 (×9): qty 30

## 2017-04-10 MED ORDER — ALUM & MAG HYDROXIDE-SIMETH 200-200-20 MG/5ML PO SUSP
15.0000 mL | Freq: Once | ORAL | Status: AC
  Administered 2017-04-10: 15 mL via ORAL
  Filled 2017-04-10: qty 30

## 2017-04-10 NOTE — Progress Notes (Signed)
ANTICOAGULATION CONSULT NOTE  Pharmacy Consult for Lovenox Indication: VTE prophylaxis   Assessment: 67 yof admitted with R knee pain/swelling. Pharmacy consulted to dose Lovenox for VTE prophylaxis. Not on anticoagulation PTA. CBC wnl, CrCl>100.  Goal of Therapy:  VTE prevention Monitor platelets by anticoagulation protocol: Yes   Plan:  Lovenox 55mg  (~0.5 mg/kg) q24h Monitor CBC at least q72h, s/sx bleeding Rx will s/o consult  Elicia Lamp, PharmD, BCPS Clinical Pharmacist 04/10/2017 9:53 AM

## 2017-04-11 LAB — BASIC METABOLIC PANEL
Anion gap: 9 (ref 5–15)
BUN: 12 mg/dL (ref 6–20)
CO2: 25 mmol/L (ref 22–32)
Calcium: 9.2 mg/dL (ref 8.9–10.3)
Chloride: 102 mmol/L (ref 101–111)
Creatinine, Ser: 1.43 mg/dL — ABNORMAL HIGH (ref 0.44–1.00)
GFR calc Af Amer: >60 mL/min (ref >60)
GFR calc non Af Amer: 52 mL/min — ABNORMAL LOW (ref >60)
Glucose, Bld: 126 mg/dL — ABNORMAL HIGH (ref 65–99)
Potassium: 3.8 mmol/L (ref 3.5–5.1)
Sodium: 136 mmol/L (ref 135–145)

## 2017-04-11 LAB — VANCOMYCIN, TROUGH: Vancomycin Tr: 35 ug/mL (ref 15–20)

## 2017-04-11 LAB — CBC
HCT: 38.3 % (ref 36.0–46.0)
Hemoglobin: 12.5 g/dL (ref 12.0–15.0)
MCH: 30.8 pg (ref 26.0–34.0)
MCHC: 32.6 g/dL (ref 30.0–36.0)
MCV: 94.3 fL (ref 78.0–100.0)
Platelets: 161 10*3/uL (ref 150–400)
RBC: 4.06 MIL/uL (ref 3.87–5.11)
RDW: 12.9 % (ref 11.5–15.5)
WBC: 21.9 10*3/uL — ABNORMAL HIGH (ref 4.0–10.5)

## 2017-04-11 MED ORDER — FLUCONAZOLE 150 MG PO TABS
150.0000 mg | ORAL_TABLET | Freq: Once | ORAL | Status: AC
  Administered 2017-04-11: 150 mg via ORAL
  Filled 2017-04-11: qty 1

## 2017-04-11 MED ORDER — GI COCKTAIL ~~LOC~~
30.0000 mL | Freq: Once | ORAL | Status: AC
  Administered 2017-04-11: 30 mL via ORAL
  Filled 2017-04-11: qty 30

## 2017-04-11 MED ORDER — VANCOMYCIN HCL IN DEXTROSE 1-5 GM/200ML-% IV SOLN
1000.0000 mg | Freq: Two times a day (BID) | INTRAVENOUS | Status: DC
  Administered 2017-04-12: 1000 mg via INTRAVENOUS
  Filled 2017-04-11 (×2): qty 200

## 2017-04-11 NOTE — Progress Notes (Addendum)
Pharmacy Antibiotic Note  Linda [name] is a 21 y.o. female admitted on 04/09/2017 with right knee swelling and MRI showed edema and fluid concerning for SQ emphysema.  There is concern for necrotizing fasciitis and Pharmacy has been consulted for vancomycin and Zosyn dosing.  Patient is also on clindamycin per MD.  Patient has AKI (SCr 1 >> 1.43).  Vancomycin trough is supra-therapeutic at 35 mcg/mL.  Spoke to MD who plans to transition patient to PO antibiotics tomorrow.   Plan: Change vancomycin to 1 gram iv Q 12 hours starting in AM Continue Zosyn 3.375 g IV Q8H, 4 hr infusion Monitor renal fxn, clinical progress Hold Toradol, lisinopril, and HCTZ per discussion with MD   Height: 5\' 6"  (167.6 cm) Weight: 251 lb 1.6 oz (113.9 kg) IBW/kg (Calculated) : 59.3  Temp (24hrs), Avg:98 F (36.7 C), Min:97.5 F (36.4 C), Max:98.3 F (36.8 C)   Recent Labs Lab 04/09/17 0044 04/10/17 0318 04/11/17 0509 04/11/17 1112  WBC 9.4 11.9* 21.9*  --   CREATININE 0.79 1.00 1.43*  --   VANCOTROUGH  --   --   --  35*    Estimated Creatinine Clearance: 79.7 mL/min (A) (by C-G formula based on SCr of 1.43 mg/dL (H)).    Allergies  Allergen Reactions  . Vicodin [Hydrocodone-Acetaminophen] Itching and Rash    Clinda 8/10 >>  Zosyn 8/10 >>  Vanc 8/10 >>  8/12 1112 VT = 35 mcg/ml on 1g q8 (SCr 1.43).  Ke = [id number], t1/2 = 11 hrs  8/10 BCx - NGTD    Thank you Anette Guarneri, PharmD [phone]

## 2017-04-11 NOTE — Progress Notes (Signed)
Family Medicine Teaching Service Daily Progress Note Intern Pager: [phone]  Patient name: Linda [name] Medical record number: [id number] Date of birth: 09-05-1995 Age: 21 y.o. Gender: female  Primary Care Provider: Tonette Bihari, MD Consultants: Ortho  Code Status: Full   Pt Overview and Major Events to Date:   Assessment and Plan:  Linda [name] is a 21 y.o. female presenting with right knee pain and swelling . PMH is significant for lupus (not on medications), connective tissue disease (not on medications), HTN, Obesity.  Right Knee Pain/Swelling: Infection vs. Autoimmune infection; MRI R knee with prominent soft tissue edema and fluid anterior to the knee,some concerning for subcutaneous emphysema. Right Knee x-ray with extensive dystrophic type calcifications along the extensor knee, correlating with history of mixed connective tissue disease. Per ortho no concern for infections process, including necrotizing fascitis - most concerning for autoimmune given patient's lack of fever. Patient now with leukocytosis although steroids were recently initiated. Clinically much improved.  - Transfer to med-surg  - continue IV abx with vancomycin, zosyn, and clindamycin for another 24h; will plan to transition to PO abx tomorrow if patient continues to remain clinically stable; per ortho no concern for nec fasc  - prednisone 50 mg (day 2) for possible autoimmune process  - Toradol for pain day 3 of 5  - blood culture (after abx) NGTD   HTN: Non-compliant with blood pressure medication. Kidney function normal, UA wnl. Blood pressure normotensive this AM  - Could consider further work up outpatient  - HCTZ and lisinopril   AKI: Cr bumped to 1.43 from 1.00.  -monitor Cr  -consider d/c of toradol and lisinopril if continues to worsen   Heartburn: Patient reporting reflux like symptoms.  -Pepcid daily prn -GI cocktail BID prn   Yeast Vaginitis: Likely secondary to  antibiotics.  -Diflucan x1 on 8/12, can re-order in 72h if still symptomatic      FEN/GI: Regular Diet Prophylaxis: Lovenox, Pepcid  Disposition: Discharge following pain control and improvement in clinical status, social work consulted for patient's issues with insurance   Subjective:  Patient reports knee pain is much improved from yesterday. Concerned that she is getting a yeast infection as she endorses a lot of vaginal itching. Also reports hearburn.   Objective: Temp:  [97.5 F (36.4 C)-98.3 F (36.8 C)] 97.5 F (36.4 C) (08/12 0806) Pulse Rate:  [60-82] 60 (08/12 0806) Resp:  [18] 18 (08/12 0806) BP: (104-135)/(66-96) 131/96 (08/12 0806) SpO2:  [98 %] 98 % (08/12 0806) Physical Exam: General: Well appearing young woman, NAD Cardiovascular: Regular rate rhythm, no murmurs Respiratory: CTAB, normal WOB  Abdomen: Bowel sounds present, NDNT Extremities: Right knee edema compared to left knee but improcing, minimally warm to touch, mild TTP over anterior knee, ROM of right knee significantly improved in flexion and extension, pulses intact, no changes in sensation  Laboratory:  Recent Labs Lab 04/09/17 0044 04/10/17 0318 04/11/17 0509  WBC 9.4 11.9* 21.9*  HGB 13.2 12.4 12.5  HCT 40.8 37.6 38.3  PLT 169 159 161    Recent Labs Lab 04/09/17 0044 04/10/17 0318 04/11/17 0509  NA 135 132* 136  K 3.9 3.7 3.8  CL 104 98* 102  CO2 22 24 25   BUN 7 6 12   CREATININE 0.79 1.00 1.43*  CALCIUM 9.1 8.8* 9.2  PROT  --  6.9  --   BILITOT  --  1.7*  --   ALKPHOS  --  52  --   ALT  --  9*  --   AST  --  19  --   GLUCOSE 100* 119* 126*     Imaging/Diagnostic Tests: Dg Knee Complete 4 Views Right  Result Date: 04/09/2017 CLINICAL DATA:  Lupus.  Mixed connective tissue disease. EXAM: RIGHT KNEE - COMPLETE 4+ VIEW COMPARISON:  MRI from earlier today. FINDINGS: There is extensive sheet like and nodular calcification along the extensor knee, extending from lower quadriceps  to the upper shin. No soft tissue emphysema. Negative for joint effusion, bone erosion, or fracture deformity. No degenerative changes. IMPRESSION: Extensive dystrophic type calcifications along the extensor knee, correlating with history of mixed connective tissue disease. No soft tissue emphysema. Electronically Signed   By: Monte Fantasia M.D.   On: 04/09/2017 09:25    Nicolette Bang, DO 04/11/2017, 8:33 AM PGY-3, [place] Intern pager: [phone], text pages welcome

## 2017-04-12 ENCOUNTER — Inpatient Hospital Stay (HOSPITAL_COMMUNITY): Payer: Medicaid Other

## 2017-04-12 LAB — CBC
HCT: 35 % — ABNORMAL LOW (ref 36.0–46.0)
Hemoglobin: 11.5 g/dL — ABNORMAL LOW (ref 12.0–15.0)
MCH: 30.9 pg (ref 26.0–34.0)
MCHC: 32.9 g/dL (ref 30.0–36.0)
MCV: 94.1 fL (ref 78.0–100.0)
Platelets: 185 10*3/uL (ref 150–400)
RBC: 3.72 MIL/uL — ABNORMAL LOW (ref 3.87–5.11)
RDW: 12.9 % (ref 11.5–15.5)
WBC: 20.4 10*3/uL — ABNORMAL HIGH (ref 4.0–10.5)

## 2017-04-12 LAB — BASIC METABOLIC PANEL
Anion gap: 9 (ref 5–15)
BUN: 15 mg/dL (ref 6–20)
CO2: 24 mmol/L (ref 22–32)
Calcium: 9.4 mg/dL (ref 8.9–10.3)
Chloride: 103 mmol/L (ref 101–111)
Creatinine, Ser: 1.52 mg/dL — ABNORMAL HIGH (ref 0.44–1.00)
GFR calc Af Amer: 56 mL/min — ABNORMAL LOW (ref >60)
GFR calc non Af Amer: 48 mL/min — ABNORMAL LOW (ref >60)
Glucose, Bld: 123 mg/dL — ABNORMAL HIGH (ref 65–99)
Potassium: 3.9 mmol/L (ref 3.5–5.1)
Sodium: 136 mmol/L (ref 135–145)

## 2017-04-12 MED ORDER — SIMETHICONE 80 MG PO CHEW
80.0000 mg | CHEWABLE_TABLET | Freq: Four times a day (QID) | ORAL | Status: DC | PRN
  Administered 2017-04-14: 80 mg via ORAL
  Filled 2017-04-12: qty 1

## 2017-04-12 MED ORDER — AMLODIPINE BESYLATE 10 MG PO TABS
10.0000 mg | ORAL_TABLET | Freq: Every day | ORAL | Status: DC
  Administered 2017-04-12 – 2017-04-14 (×3): 10 mg via ORAL
  Filled 2017-04-12 (×3): qty 1

## 2017-04-12 MED ORDER — OXYCODONE-ACETAMINOPHEN 7.5-325 MG PO TABS
1.0000 | ORAL_TABLET | Freq: Four times a day (QID) | ORAL | Status: DC | PRN
  Administered 2017-04-12 – 2017-04-13 (×4): 1 via ORAL
  Filled 2017-04-12 (×5): qty 1

## 2017-04-12 MED ORDER — DOXYCYCLINE HYCLATE 100 MG PO TABS
100.0000 mg | ORAL_TABLET | Freq: Two times a day (BID) | ORAL | Status: DC
  Administered 2017-04-12 – 2017-04-14 (×5): 100 mg via ORAL
  Filled 2017-04-12 (×5): qty 1

## 2017-04-12 NOTE — Progress Notes (Signed)
Patient c/o current pain medicine is not working,also verbalized that she had total of six (6) loose stools today. Text paged MD on call. Awaiting call back. [place], Wonda Cheng, Therapist, sports

## 2017-04-12 NOTE — Progress Notes (Signed)
Family Medicine Teaching Service Daily Progress Note Intern Pager: [phone]  Patient name: Linda [name] Medical record number: [id number] Date of birth: 1996/01/26 Age: 21 y.o. Gender: female  Primary Care Provider: Tonette Bihari, MD Consultants: Ortho  Code Status: Full   Pt Overview and Major Events to Date:   Assessment and Plan:  Linda [name] is a 21 y.o. female presenting with right knee pain and swelling . PMH is significant for lupus (not on medications), connective tissue disease (not on medications), HTN, Obesity.  Right Knee Pain/Swelling: Infection vs. Autoimmune infection; MRI R knee with prominent soft tissue edema and fluid anterior to the knee,some concerning for subcutaneous emphysema. Right Knee x-ray with extensive dystrophic type calcifications along the extensor knee, correlating with history of mixed connective tissue disease. Per ortho no concern for infections process, including necrotizing fascitis - most concerning for autoimmune given patient's lack of fever. Patient now with leukocytosis although steroids were recently initiated. Clinically much improved.  Have D/C'd toradol due to AKI. - Transfer to med-surg  - transition to doxycycline 100 mg BID since patient is clinically stable; per ortho no concern for nec fasc  - prednisone 50 mg (day 3) for possible autoimmune process  - blood culture (after abx) NGTD - transition Dilaudid IV 1 g Q3H PRN to oxycodone 10 mg Q4H PRN?  HTN: Non-compliant with blood pressure medication. Kidney function normal, UA wnl. Blood pressure 173/113 on 8/13 am, has been hypertensive during past 24 hours.  Holding HCTZ and lisinopril due to AKI. - continue with PRN hydralazine 10 mg Q4H with parameters - Consider further work up outpatient   AKI: Cr bumped to 1.52 on 8/13 from 1.00.  -monitor Cr  -consider d/c of toradol and lisinopril if continues to worsen   Heartburn: Patient reporting reflux like symptoms.   -Pepcid daily prn -GI cocktail BID prn   Yeast Vaginitis: Likely secondary to antibiotics.  -Diflucan x1 on 8/12, can re-order in 72h if still symptomatic    FEN/GI: Regular Diet Prophylaxis: Lovenox, Pepcid  Disposition: Discharge following pain control and improvement in clinical status, social work consulted for patient's issues with insurance   Subjective:  Patient reports knee pain continues to be much improved.  Complaining of stomach pain and one loose stool overnight.  Objective: Temp:  [97.5 F (36.4 C)-98.7 F (37.1 C)] 97.9 F (36.6 C) (08/13 0500) Pulse Rate:  [60-87] 80 (08/13 0500) Resp:  [18] 18 (08/13 0500) BP: (131-173)/(96-113) 173/113 (08/13 0500) SpO2:  [98 %-100 %] 100 % (08/13 0500) Physical Exam: General: Well appearing young woman, NAD Cardiovascular: Regular rate rhythm, no murmurs Respiratory: CTAB, normal WOB  Abdomen: Bowel sounds present, NDNT Extremities: Right knee edema compared to left knee but improving, very minimally warm to touch, mild TTP over anterior knee, ROM of right knee significantly improved in flexion and extension, pulses intact, no changes in sensation  Laboratory:  Recent Labs Lab 04/10/17 0318 04/11/17 0509 04/12/17 0236  WBC 11.9* 21.9* 20.4*  HGB 12.4 12.5 11.5*  HCT 37.6 38.3 35.0*  PLT 159 161 185    Recent Labs Lab 04/10/17 0318 04/11/17 0509 04/12/17 0236  NA 132* 136 136  K 3.7 3.8 3.9  CL 98* 102 103  CO2 24 25 24   BUN 6 12 15   CREATININE 1.00 1.43* 1.52*  CALCIUM 8.8* 9.2 9.4  PROT 6.9  --   --   BILITOT 1.7*  --   --   ALKPHOS 52  --   --  ALT 9*  --   --   AST 19  --   --   GLUCOSE 119* 126* 123*     Imaging/Diagnostic Tests: No results found.  Kathrene Alu, MD 04/12/2017, 6:56 AM PGY-1, [place] Intern pager: [phone], text pages welcome

## 2017-04-13 DIAGNOSIS — R109 Unspecified abdominal pain: Secondary | ICD-10-CM

## 2017-04-13 LAB — BASIC METABOLIC PANEL
Anion gap: 9 (ref 5–15)
BUN: 14 mg/dL (ref 6–20)
CO2: 25 mmol/L (ref 22–32)
Calcium: 9.5 mg/dL (ref 8.9–10.3)
Chloride: 105 mmol/L (ref 101–111)
Creatinine, Ser: 1.39 mg/dL — ABNORMAL HIGH (ref 0.44–1.00)
GFR calc Af Amer: >60 mL/min (ref >60)
GFR calc non Af Amer: 54 mL/min — ABNORMAL LOW (ref >60)
Glucose, Bld: 94 mg/dL (ref 65–99)
Potassium: 4.1 mmol/L (ref 3.5–5.1)
Sodium: 139 mmol/L (ref 135–145)

## 2017-04-13 LAB — CBC
HCT: 37.6 % (ref 36.0–46.0)
Hemoglobin: 12 g/dL (ref 12.0–15.0)
MCH: 30.5 pg (ref 26.0–34.0)
MCHC: 31.9 g/dL (ref 30.0–36.0)
MCV: 95.7 fL (ref 78.0–100.0)
Platelets: 200 10*3/uL (ref 150–400)
RBC: 3.93 MIL/uL (ref 3.87–5.11)
RDW: 13.3 % (ref 11.5–15.5)
WBC: 18.1 10*3/uL — ABNORMAL HIGH (ref 4.0–10.5)

## 2017-04-13 LAB — HEPATIC FUNCTION PANEL
ALT: 13 U/L — ABNORMAL LOW (ref 14–54)
AST: 22 U/L (ref 15–41)
Albumin: 3.5 g/dL (ref 3.5–5.0)
Alkaline Phosphatase: 53 U/L (ref 38–126)
Bilirubin, Direct: <0.1 mg/dL — ABNORMAL LOW (ref 0.1–0.5)
Total Bilirubin: 0.9 mg/dL (ref 0.3–1.2)
Total Protein: 8.1 g/dL (ref 6.5–8.1)

## 2017-04-13 LAB — C DIFFICILE QUICK SCREEN W PCR REFLEX
C Diff antigen: NEGATIVE
C Diff interpretation: NOT DETECTED
C Diff toxin: NEGATIVE

## 2017-04-13 MED ORDER — OXYCODONE-ACETAMINOPHEN 7.5-325 MG PO TABS
1.0000 | ORAL_TABLET | ORAL | Status: DC | PRN
  Administered 2017-04-13 – 2017-04-14 (×3): 1 via ORAL
  Filled 2017-04-13 (×3): qty 1

## 2017-04-13 MED ORDER — PREDNISONE 20 MG PO TABS
30.0000 mg | ORAL_TABLET | Freq: Every day | ORAL | Status: DC
  Administered 2017-04-14: 30 mg via ORAL
  Filled 2017-04-13: qty 1

## 2017-04-13 NOTE — Progress Notes (Signed)
Family Medicine Teaching Service Daily Progress Note Intern Pager: [phone]  Patient name: Linda [name] Medical record number: [id number] Date of birth: July 08, 1996 Age: 21 y.o. Gender: female  Primary Care Provider: Tonette Bihari, MD Consultants: Ortho  Code Status: Full   Pt Overview and Major Events to Date:   Assessment and Plan:  Linda [name] is a 21 y.o. female presenting with right knee pain and swelling . PMH is significant for connective tissue disease (not on medications), HTN, Obesity.  Right Knee Pain/Swelling: Infection vs. Autoimmune infection;  Patient's swelling, pain, and erythema are much improved.   Patient now with leukocytosis although steroids were recently initiated.    - Continue doxycycline 100 mg BID since patient is clinically stable; per ortho no concern for nec fasc  - decrease prednisone to 30 mg for possible autoimmune process, will continue to decrease prednisone tomorrow - blood culture (after abx) NGTD - continue Percocet 7.5-325 mg Q4H PRN for pain relief  Abdominal Pain: Patient complains of cramping abdominal pain that is worse after eating.  Could be related to doxycycline, pain medications, connective tissue disease, dyspepsia.  Obtained a KUB and RUQ [place] on 8/13, which were both negative.  Patient is tender to palpation over RUQ.  GI cocktails, Pepcid, PPIs have not been effective.  LFTs on 8/11 were wnl.  Reports 7 watery stools over the past day.  Repeat liver function panel negative for any elevations. - C.difficile testing - testing for H. pylori - continue to monitor - outpatient follow-up  HTN: Non-compliant with blood pressure medication. Kidney function normal, UA wnl. Blood pressure 158/109 on 8/14 am.  Added Norvasc 10 mg daily on 8/13.  Holding HCTZ and lisinopril due to AKI. - continue Norvasc; will not add further BP meds as Norvasc may take a few days to have full effect - continue with PRN hydralazine 10 mg Q4H with  parameters - Consider further work up outpatient   AKI: Cr down to 1.39 on 8/14 from 1.52 on 8/13.  Baseline appears to be about 1.00.  -monitor Cr  - continue to hold nephrotoxic medications   Yeast Vaginitis: Likely secondary to antibiotics.  -Diflucan x1 on 8/12, can re-order in 72h if still symptomatic    FEN/GI: Regular Diet Prophylaxis: Lovenox, Pepcid  Disposition: Discharge following pain control and improvement in clinical status, social work consulted for patient's issues with insurance   Subjective:  Patient reports that knee is "perfect" today, although she says that she has had 7 watery stools during the past day and that she has abdominal pain bilaterally under her ribs.  She says that her pain medication has not been helping.  Objective: Temp:  [98.4 F (36.9 C)-98.6 F (37 C)] 98.6 F (37 C) (08/14 0702) Pulse Rate:  [77-85] 85 (08/14 0702) Resp:  [16-18] 16 (08/14 0702) BP: (158)/(109) 158/109 (08/14 0702) SpO2:  [100 %] 100 % (08/14 3710) Physical Exam: General: Well appearing young woman, in NAD, sitting up in bed eating breakfast Cardiovascular: Regular rate rhythm, no murmurs Respiratory: CTAB, normal WOB  Abdomen: Bowel sounds are hyperactive, no tenderness to deep palpation Extremities: Improved right knee edema, warmth is equal to the warmth on   Laboratory:  Recent Labs Lab 04/11/17 0509 04/12/17 0236 04/13/17 0410  WBC 21.9* 20.4* 18.1*  HGB 12.5 11.5* 12.0  HCT 38.3 35.0* 37.6  PLT 161 185 200    Recent Labs Lab 04/10/17 0318 04/11/17 0509 04/12/17 0236 04/13/17 0410  NA 132* 136  136 139  K 3.7 3.8 3.9 4.1  CL 98* 102 103 105  CO2 24 25 24 25   BUN 6 12 15 14   CREATININE 1.00 1.43* 1.52* 1.39*  CALCIUM 8.8* 9.2 9.4 9.5  PROT 6.9  --   --   --   BILITOT 1.7*  --   --   --   ALKPHOS 52  --   --   --   ALT 9*  --   --   --   AST 19  --   --   --   GLUCOSE 119* 126* 123* 94     Imaging/Diagnostic Tests: Dg Abd 1  View  Result Date: 04/12/2017 CLINICAL DATA:  Abdominal pain, nausea and diarrhea. EXAM: ABDOMEN - 1 VIEW COMPARISON:  None. FINDINGS: The bowel gas pattern is normal. No radio-opaque calculi or other significant radiographic abnormality are seen. IMPRESSION: Normal exam. Electronically Signed   By: Inge Rise M.D.   On: 04/12/2017 15:48   US Abdomen Complete  Result Date: 04/12/2017 CLINICAL DATA:  Abdominal pain. EXAM: ABDOMEN ULTRASOUND COMPLETE COMPARISON:  Plain films of earlier today. FINDINGS: Gallbladder: No gallstones or wall thickening visualized. The patient is tender over the gallbladder. Common bile duct: Diameter: Normal, 3 mm. Liver: No focal lesion identified. Within normal limits in parenchymal echogenicity. IVC: No abnormality visualized. Pancreas: Partially obscured by bowel gas. Spleen: Size and appearance within normal limits. Right Kidney: Length: 11.8 cm. Echogenicity within normal limits. No mass or hydronephrosis visualized. Left Kidney: Length: 10.0 cm. Echogenicity within normal limits. No mass or hydronephrosis visualized. Abdominal aorta: No aneurysm visualized. Other findings: No ascites. IMPRESSION: 1. No specific explanation for abdominal pain. 2. Per the technologist, the patient is tender over the gallbladder. However, there is no evidence of cholelithiasis, gallbladder wall thickening, or pericholecystic fluid. Electronically Signed   By: Abigail Miyamoto M.D.   On: 04/12/2017 18:19    Kathrene Alu, MD 04/13/2017, 9:11 AM PGY-1, [place] Intern pager: [phone], text pages welcome

## 2017-04-14 LAB — BASIC METABOLIC PANEL
Anion gap: 10 (ref 5–15)
BUN: 13 mg/dL (ref 6–20)
CO2: 22 mmol/L (ref 22–32)
Calcium: 9.3 mg/dL (ref 8.9–10.3)
Chloride: 106 mmol/L (ref 101–111)
Creatinine, Ser: 1.34 mg/dL — ABNORMAL HIGH (ref 0.44–1.00)
GFR calc Af Amer: >60 mL/min (ref >60)
GFR calc non Af Amer: 56 mL/min — ABNORMAL LOW (ref >60)
Glucose, Bld: 105 mg/dL — ABNORMAL HIGH (ref 65–99)
Potassium: 3.6 mmol/L (ref 3.5–5.1)
Sodium: 138 mmol/L (ref 135–145)

## 2017-04-14 LAB — CULTURE, BLOOD (ROUTINE X 2)
Culture: NO GROWTH
Culture: NO GROWTH
Special Requests: ADEQUATE
Special Requests: ADEQUATE

## 2017-04-14 LAB — CBC
HCT: 37.1 % (ref 36.0–46.0)
Hemoglobin: 11.9 g/dL — ABNORMAL LOW (ref 12.0–15.0)
MCH: 30.4 pg (ref 26.0–34.0)
MCHC: 32.1 g/dL (ref 30.0–36.0)
MCV: 94.6 fL (ref 78.0–100.0)
Platelets: 202 10*3/uL (ref 150–400)
RBC: 3.92 MIL/uL (ref 3.87–5.11)
RDW: 13 % (ref 11.5–15.5)
WBC: 18.4 10*3/uL — ABNORMAL HIGH (ref 4.0–10.5)

## 2017-04-14 MED ORDER — AMLODIPINE BESYLATE 10 MG PO TABS
10.0000 mg | ORAL_TABLET | Freq: Every day | ORAL | 0 refills | Status: DC
Start: 2017-04-15 — End: 2017-05-23

## 2017-04-14 MED ORDER — OXYCODONE-ACETAMINOPHEN 7.5-325 MG PO TABS
1.0000 | ORAL_TABLET | Freq: Four times a day (QID) | ORAL | Status: DC | PRN
  Administered 2017-04-14: 1 via ORAL
  Filled 2017-04-14: qty 1

## 2017-04-14 MED ORDER — IBUPROFEN 200 MG PO TABS
400.0000 mg | ORAL_TABLET | Freq: Three times a day (TID) | ORAL | Status: DC
  Administered 2017-04-14: 400 mg via ORAL
  Filled 2017-04-14: qty 2

## 2017-04-14 MED ORDER — LIDOCAINE 4 % EX CREA
TOPICAL_CREAM | Freq: Three times a day (TID) | CUTANEOUS | Status: DC | PRN
  Filled 2017-04-14: qty 5

## 2017-04-14 MED ORDER — PREDNISONE 20 MG PO TABS: 20.0000 mg | ORAL_TABLET | Freq: Every day | ORAL | 0 refills | Status: AC

## 2017-04-14 MED ORDER — DOXYCYCLINE HYCLATE 100 MG PO TABS: 100.0000 mg | ORAL_TABLET | Freq: Two times a day (BID) | ORAL | 0 refills | Status: AC

## 2017-04-14 MED ORDER — LOPERAMIDE HCL 2 MG PO CAPS: 2.0000 mg | ORAL_CAPSULE | Freq: Four times a day (QID) | ORAL | Status: DC | PRN

## 2017-04-14 MED ORDER — LOPERAMIDE HCL 2 MG PO CAPS: 2.0000 mg | ORAL_CAPSULE | ORAL | 0 refills | Status: DC | PRN

## 2017-04-14 MED ORDER — LOPERAMIDE HCL 2 MG PO CAPS
2.0000 mg | ORAL_CAPSULE | ORAL | Status: DC | PRN
  Administered 2017-04-14: 2 mg via ORAL
  Filled 2017-04-14: qty 1

## 2017-04-14 MED ORDER — FAMOTIDINE 20 MG PO TABS: 20.0000 mg | ORAL_TABLET | Freq: Every day | ORAL | 0 refills | Status: DC | PRN

## 2017-04-14 MED ORDER — LOPERAMIDE HCL 2 MG PO CAPS
4.0000 mg | ORAL_CAPSULE | Freq: Once | ORAL | Status: AC
  Administered 2017-04-14: 4 mg via ORAL
  Filled 2017-04-14: qty 2

## 2017-04-14 MED ORDER — LIDOCAINE 4 % EX CREA: TOPICAL_CREAM | Freq: Three times a day (TID) | CUTANEOUS | 0 refills | Status: DC | PRN

## 2017-04-14 MED ORDER — PREDNISONE 20 MG PO TABS: 20.0000 mg | ORAL_TABLET | Freq: Every day | ORAL | Status: DC

## 2017-04-14 NOTE — Discharge Summary (Signed)
[facility] Discharge Summary  Patient name: Linda [name] Medical record number: [id number] Date of birth: Jan 12, 1996 Age: 21 y.o. Gender: female Date of Admission: 04/09/2017  Date of Discharge: 04/14/17 Admitting Physician: Leeanne Rio, MD  Primary Care Provider: Tonette Bihari, MD Consultants: Orthopedic Surgery  Indication for Hospitalization: Right knee swelling and pain, concern for cellulitis vs autoimmune reaction  Discharge Diagnoses/Problem List:  Right Knee Pain/Swelling Costochondritis Abdominal Pain with Diarrhea HTN Mixed Connective Tissue Disease AKI Yeast Vaginitis  Disposition: Home  Discharge Condition: stable, improved  Discharge Exam: please see progress note from day of discharge  Brief Hospital Course:  Linda [name] was admitted on 04/09/17 for concern for cellulitis or necrotizing fascitis of her right knee vs an autoimmune reaction due to her connective tissue disease.  She was given vanc/zosyn as well as prednisone 50 mg to cover both possibilities.  Her leg swelling and pain improved daily; however, she began to report pain along her bilateral lower chest as well as frequent watery stools.  Her lower chest pain was thought to be costochondritis since it was reproducible on exam and relieved with a heating pad.  Her stool was found to be negative for C. Difficile, so she was treated symptomatically with loperamide.  She was able to eat without issue during her hospital stay and appeared euvolemic each day.    Another issue during her hospitalization was her hypertension.  She has had blood pressures were consistently in the 150-160s/110s while being treated with 10 mg Norvasc.  She has a history of hypertension, and she has been nonadherent to her lisinopril/HCTZ as an outpatient.  These medications were held during hospitalization due to an AKI (creatinine of 1.52 compared to baseline of 0.75).  Her kidney  function started to resolve on 8/14 with a creatinine of 1.39.  Issues for Follow Up:  1. Right knee pain/swelling - the etiology of this issue is unclear due to dual treatment with antibiotics and steroids.  Orthopedic surgery did not think that an infectious process was taking place, and she was never febrile, so evidence points to an autoimmune process, but infection cannot be ruled out.  Her right leg should be thoroughly examined at follow-up.  Also, patient has been prescribed doxycycline and prednisone for two days post discharge to complete a 7-day course of each. 2. Mixed Connective Tissue Disease - patient has multiple sites of ulceration and would likely benefit from a consult to rheumatology.  Due to her lack of insurance, this will be difficult.  However, frequent follow-up is advised at the [facility] clinic. 3. Hypertension - Her blood pressures were consistently elevated during hospitalization.  Patient has a history of nonadherence to her blood pressure regimen.  The importance of blood pressure control was stressed during her hospitalization, but continued monitoring and encouragement is warranted in the outpatient setting.  We will restart her lisinopril/HCTZ if her creatinine improves at discharge, but further medication adjustments may be necessary.  Norvasc was not very helpful in controlling her blood pressure, so you can consider discontinuing this medication.   4. AKI - patient needs a BMP checked at the office to follow up on her elevated creatinine.  Adjust nephrotoxic medications as necessary in response to her kidney function. 5. Diarrhea - check fluid status of patient and ensure that patient is taking plenty of PO fluids.  Diarrhea is likely viral, but if she is still symptomatic, consider a GI panel. 6. Costochondritis and back  pain - most likely musculoskeletal and will improve with more ambulation and activity once patient is discharged.  If still unimproved and creatinine is  normal, can prescribe NSAIDs if necessary.    Significant Procedures: none  Significant Labs and Imaging:   Recent Labs Lab 04/12/17 0236 04/13/17 0410 04/14/17 0611  WBC 20.4* 18.1* 18.4*  HGB 11.5* 12.0 11.9*  HCT 35.0* 37.6 37.1  PLT 185 200 202    Recent Labs Lab 04/10/17 0318 04/11/17 0509 04/12/17 0236 04/13/17 0410 04/13/17 1229 04/14/17 1149  NA 132* 136 136 139  --  138  K 3.7 3.8 3.9 4.1  --  3.6  CL 98* 102 103 105  --  106  CO2 24 25 24 25   --  22  GLUCOSE 119* 126* 123* 94  --  105*  BUN 6 12 15 14   --  13  CREATININE 1.00 1.43* 1.52* 1.39*  --  1.34*  CALCIUM 8.8* 9.2 9.4 9.5  --  9.3  ALKPHOS 52  --   --   --  53  --   AST 19  --   --   --  22  --   ALT 9*  --   --   --  13*  --   ALBUMIN 3.1*  --   --   --  3.5  --     Dg Chest 2 View  Result Date: 04/09/2017 CLINICAL DATA:  Chest pain and dyspnea.  History of lupus. EXAM: CHEST  2 VIEW COMPARISON:  12/24/2015 FINDINGS: The heart size and mediastinal contours are within normal limits. Both lungs are clear. The visualized skeletal structures are unremarkable. IMPRESSION: No active cardiopulmonary disease. Electronically Signed   By: Ashley Royalty M.D.   On: 04/09/2017 01:21   Mr Knee Right Wo Contrast  Result Date: 04/09/2017 CLINICAL DATA:  Right knee swelling and redness. EXAM: MRI OF THE RIGHT KNEE WITHOUT CONTRAST TECHNIQUE: Multiplanar, multisequence MR imaging of the knee was performed. No intravenous contrast was administered. COMPARISON:  None. FINDINGS: MENISCI Medial meniscus:  Intact and normal morphology. Lateral meniscus:  Intact and normal morphology. LIGAMENTS Cruciates:  Intact ACL and PCL. Collaterals: Medial collateral ligament is intact. Lateral collateral ligament complex is intact. CARTILAGE Patellofemoral:  Normal. Medial:  Normal. Lateral:  Normal. Joint:  No joint effusion. Normal Hoffa's fat. No plical thickening. Popliteal Fossa:  No Baker cyst. Intact popliteus tendon. Extensor  Mechanism:  Intact quadriceps tendon and patellar tendon. Bones: No focal marrow signal abnormality. No fracture or dislocation. Other: There is ill definition of and increased signal within the medial patellofemoral ligament. There is prominent soft tissue edema and fluid along the anterior knee, with multiple scattered foci of T1 and T2 hypointense signal, concerning for gas. There is edema involving the distal vastus lateralis and vastus medialis muscles. IMPRESSION: 1. Prominent soft tissue edema and fluid anterior to the knee, with scattered foci of T1 and T2 hypointensity, concerning for subcutaneous emphysema, raising the possibility of gas-forming organisms. Clinical correlation for necrotizing fasciitis is recommended. 2. Fluid and inflammation extends into the distal aspects of the vastus lateralis and vastus medialis muscles. There is also involvement of the medial patellofemoral ligament, which may be partially torn as a result. 3. No knee joint effusion.  No evidence of internal derangement. Critical Value/emergent results were called by telephone at the time of interpretation on 04/09/2017 at 8:32 am to Dr. Dolly Rias, MD, who verbally acknowledged these results. Electronically Signed   By: Gwyndolyn Saxon  Marzella Schlein M.D.   On: 04/09/2017 08:38   Dg Knee Complete 4 Views Right  Result Date: 04/09/2017 CLINICAL DATA:  Lupus.  Mixed connective tissue disease. EXAM: RIGHT KNEE - COMPLETE 4+ VIEW COMPARISON:  MRI from earlier today. FINDINGS: There is extensive sheet like and nodular calcification along the extensor knee, extending from lower quadriceps to the upper shin. No soft tissue emphysema. Negative for joint effusion, bone erosion, or fracture deformity. No degenerative changes. IMPRESSION: Extensive dystrophic type calcifications along the extensor knee, correlating with history of mixed connective tissue disease. No soft tissue emphysema. Electronically Signed   By: Monte Fantasia M.D.   On: 04/09/2017  09:25    Results/Tests Pending at Time of Discharge: H. Pylori  Discharge Medications:  Allergies as of 04/14/2017      Reactions   Vicodin [hydrocodone-acetaminophen] Itching, Rash      Medication List    STOP taking these medications   acetaminophen 500 MG tablet Commonly known as:  TYLENOL   fluconazole 200 MG tablet Commonly known as:  DIFLUCAN   hydrochlorothiazide 25 MG tablet Commonly known as:  HYDRODIURIL   lisinopril 40 MG tablet Commonly known as:  PRINIVIL,ZESTRIL     TAKE these medications   amLODipine 10 MG tablet Commonly known as:  NORVASC Take 1 tablet (10 mg total) by mouth daily.   doxycycline 100 MG tablet Commonly known as:  VIBRA-TABS Take 1 tablet (100 mg total) by mouth every 12 (twelve) hours.   famotidine 20 MG tablet Commonly known as:  PEPCID Take 1 tablet (20 mg total) by mouth daily as needed for heartburn or indigestion.   lidocaine 4 % cream Commonly known as:  LMX Apply topically 3 (three) times daily as needed (pain).   loperamide 2 MG capsule Commonly known as:  IMODIUM Take 1 capsule (2 mg total) by mouth as needed for diarrhea or loose stools.   predniSONE 20 MG tablet Commonly known as:  DELTASONE Take 1 tablet (20 mg total) by mouth daily with breakfast. What changed:  how much to take  how to take this  when to take this  additional instructions       Discharge Instructions: Please refer to Patient Instructions section of EMR for full details.  Patient was counseled important signs and symptoms that should prompt return to medical care, changes in medications, dietary instructions, activity restrictions, and follow up appointments.   Follow-Up Appointments: Follow-up Information    Tonette Bihari, MD Follow up on 04/22/2017.   Specialty:  Family Medicine Why:  Please arrive at 2:15PM Contact information: [place] [id number] [phone]           Kathrene Alu,  MD 04/16/2017, 4:25 PM PGY-1, [place]

## 2017-04-14 NOTE — Discharge Instructions (Signed)
Abdominal Pain, Adult Abdominal pain can be caused by many things. Often, abdominal pain is not serious and it gets better with no treatment or by being treated at home. However, sometimes abdominal pain is serious. Your health care provider will do a medical history and a physical exam to try to determine the cause of your abdominal pain. Follow these instructions at home:  Take over-the-counter and prescription medicines only as told by your health care provider. Do not take a laxative unless told by your health care provider.  Drink enough fluid to keep your urine clear or pale yellow.  Watch your condition for any changes.  Keep all follow-up visits as told by your health care provider. This is important. Contact a health care provider if:  Your abdominal pain changes or gets worse.  You are not hungry or you lose weight without trying.  You are constipated or have diarrhea for more than 2-3 days.  You have pain when you urinate or have a bowel movement.  Your abdominal pain wakes you up at night.  Your pain gets worse with meals, after eating, or with certain foods.  You are throwing up and cannot keep anything down.  You have a fever. Get help right away if:  Your pain does not go away as soon as your health care provider told you to expect.  You cannot stop throwing up.  Your pain is only in areas of the abdomen, such as the right side or the left lower portion of the abdomen.  You have bloody or black stools, or stools that look like tar.  You have severe pain, cramping, or bloating in your abdomen.  You have signs of dehydration, such as: ? Dark urine, very little urine, or no urine. ? Cracked lips. ? Dry mouth. ? Sunken eyes. ? Sleepiness. ? Weakness. This information is not intended to replace advice given to you by your health care provider. Make sure you discuss any questions you have with your health care provider. Document Released: 05/27/2005  Document Revised: 03/06/2016 Document Reviewed: 01/29/2016 Elsevier Interactive Patient Education  2017 [place].   Acute Pain, Adult Acute pain is a type of pain that may last for just a few days or as long as six months. It is often related to an illness, injury, or medical procedure. Acute pain may be mild, moderate, or severe. It usually goes away once your injury has healed or you are no longer ill. Pain can make it hard for you to do daily activities. It can cause anxiety and lead to other problems if left untreated. Treatment depends on the cause and severity of your acute pain. Follow these instructions at home:  Check your pain level as told by your health care provider.  Take over-the-counter and prescription medicines only as told by your health care provider.  If you are taking prescription pain medicine: ? Ask your health care provider about taking a stool softener or laxative to prevent constipation. ? Do not stop taking the medicine suddenly. Talk to your health care provider about how and when to discontinue prescription pain medicine. ? If your pain is severe, do not take more pills than instructed by your health care provider. ? Do not take other over-the-counter pain medicines in addition to this medicine unless told by your health care provider. ? Do not drive or operate heavy machinery while taking prescription pain medicine.  Apply ice or heat as told by your health care provider. These  may reduce swelling and pain.  Ask your health care provider if other strategies such as distraction, relaxation, or physical therapies can help your pain.  Keep all follow-up visits as told by your health care provider. This is important. Contact a health care provider if:  You have pain that is not controlled by medicine.  Your pain does not improve or gets worse.  You have side effects from pain medicines, such as vomitingor confusion. Get help right away if:  You have  severe pain.  You have trouble breathing.  You lose consciousness.  You have chest pain or pressure that lasts for more than a few minutes. Along with the chest pain you may: ? Have pain or discomfort in one or both arms, your back, neck, jaw, or stomach. ? Have shortness of breath. ? Break out in a cold sweat. ? Feel nauseous. ? Become light-headed. These symptoms may represent a serious problem that is an emergency. Do not wait to see if the symptoms will go away. Get medical help right away. Call your local emergency services (911 in the [place] Do not drive yourself to the hospital. This information is not intended to replace advice given to you by your health care provider. Make sure you discuss any questions you have with your health care provider. Document Released: 09/01/2015 Document Revised: 01/24/2016 Document Reviewed: 09/01/2015 Elsevier Interactive Patient Education  2018 [place] were admitted to the hospital for right knee pain and swelling. You were given antibiotics and steroids to take care of this. Your swelling improved and you are not stable to go home. You will need to have 2 more days of taking the antibiotics and steroids. We would also like to see you in our clinic for a hospital follow up visit. Please go to the appt time listed above.   You can continue taking Immodium for your diarrhea and ibuprofen for any pain.   It was a pleasure taking care of you! All the best.

## 2017-04-14 NOTE — Progress Notes (Signed)
Family Medicine Teaching Service Daily Progress Note Intern Pager: [phone]  Patient name: Linda [name] Medical record number: [id number] Date of birth: 1996-07-09 Age: 21 y.o. Gender: female  Primary Care Provider: Tonette Bihari, MD Consultants: Ortho  Code Status: Full   Pt Overview and Major Events to Date:   Assessment and Plan:  Linda [name] is a 21 y.o. female presenting with right knee pain and swelling . PMH is significant for connective tissue disease (not on medications), HTN, Obesity.  Right Knee Pain/Swelling: Infection vs. Autoimmune infection;  Patient's swelling, pain, and erythema are mostly resolved.   Patient continues to have leukocytosis although steroids were recently initiated.    - Continue doxycycline 100 mg BID since patient is clinically stable; per ortho no concern for nec fasc  - decrease prednisone to 20 mg for possible autoimmune process, resolving - blood culture (after abx) NGTD  Costochondritis: Patient reports pain along bilateral ribs that is tender to palpation.  She also reports some back pain.  Percocet has been of minimal help to her, and she was kept awake last night due to pain. - ibuprofen 400 mg once d/t recovering kidney function - Lidocaine cream - continue Percocet 7.5-325 mg Q6H PRN for pain relief - will discontinue at discharge  Abdominal Pain:  Could be related to doxycycline, viral gastroenteritis, dyspepsia.  Obtained a KUB and RUQ [place] on 8/13, which were both negative.  GI cocktails, Pepcid, PPIs have not been effective. Reports 12 watery stools over the past day.  Repeat liver function panel on 8/14 negative for any elevations.  C.dif negative. - loperamide 4 mg once then 2 mg PRN after diarrhea episodes - outpatient follow-up  HTN:  Could be a combination of baseline primary hypertension and autonomic reaction to pain.  Non-compliant with blood pressure medication as outpatient.  Blood pressure 165/112 on 8/15 am.   Has not received any PRN hydralazine.  Holding HCTZ and lisinopril due to AKI. - continue Norvasc - consider restarting lisinopril and HCTZ if creatinine further resolves today - continue with PRN hydralazine 10 mg Q4H with parameters - Consider further work up outpatient   AKI: Cr down to 1.39 on 8/14 from 1.52 on 8/13.  Baseline appears to be about 1.00.  -monitor Cr on am lab for 8/15 - continue to hold nephrotoxic medications  - repeat BMP at 1500 on 8/15  Yeast Vaginitis: Likely secondary to antibiotics.  -Diflucan x1 on 8/12, can re-order in 72h if still symptomatic    FEN/GI: Regular Diet Prophylaxis: Lovenox, Pepcid  Disposition: Home today   Subjective:  Patient reports that knee is doing well today and that her knee is not bothering her at all today.  However, she reports that she had 12 watery stools yesterday, and she continues to have pain in her bilateral ribs and in her back.  She has slight lower abdominal discomfort.  She had questions about how her blood pressure has been, and I told her that it has been elevated during hospitalization but that we have been watching it carefully and giving her Norvasc rather than HCTZ/lisinopril due to her lower kidney function.  I encouraged her to follow up in clinic and to be sure to take the blood pressure medications we prescribe her at discharge.  Her mother says that her pain meds have not been helping.  Otherwise, she is ready to go home today.  Objective: Temp:  [97.9 F (36.6 C)-98.6 F (37 C)] 98.3 F (36.8 C) (  08/15 0355) Pulse Rate:  [72-74] 74 (08/15 0355) Resp:  [16-17] 17 (08/15 0355) BP: (158-167)/(112-114) 165/112 (08/15 0355) SpO2:  [100 %] 100 % (08/15 0355) Physical Exam: General: Well appearing young woman, in NAD, lying in bed watching TV Cardiovascular: Regular rate rhythm, no murmurs Respiratory: CTAB, normal WOB  Abdomen: Bowel sounds are hyperactive, slight tenderness to deep palpation Extremities:  Improved right knee edema, warmth is equal to the warmth on  Musculoskeletal: tenderness to palpation of   Laboratory:  Recent Labs Lab 04/12/17 0236 04/13/17 0410 04/14/17 0611  WBC 20.4* 18.1* 18.4*  HGB 11.5* 12.0 11.9*  HCT 35.0* 37.6 37.1  PLT 185 200 202    Recent Labs Lab 04/10/17 0318 04/11/17 0509 04/12/17 0236 04/13/17 0410 04/13/17 1229  NA 132* 136 136 139  --   K 3.7 3.8 3.9 4.1  --   CL 98* 102 103 105  --   CO2 24 25 24 25   --   BUN 6 12 15 14   --   CREATININE 1.00 1.43* 1.52* 1.39*  --   CALCIUM 8.8* 9.2 9.4 9.5  --   PROT 6.9  --   --   --  8.1  BILITOT 1.7*  --   --   --  0.9  ALKPHOS 52  --   --   --  53  ALT 9*  --   --   --  13*  AST 19  --   --   --  22  GLUCOSE 119* 126* 123* 94  --      Imaging/Diagnostic Tests: Dg Abd 1 View  Result Date: 04/12/2017 CLINICAL DATA:  Abdominal pain, nausea and diarrhea. EXAM: ABDOMEN - 1 VIEW COMPARISON:  None. FINDINGS: The bowel gas pattern is normal. No radio-opaque calculi or other significant radiographic abnormality are seen. IMPRESSION: Normal exam. Electronically Signed   By: Inge Rise M.D.   On: 04/12/2017 15:48   US Abdomen Complete  Result Date: 04/12/2017 CLINICAL DATA:  Abdominal pain. EXAM: ABDOMEN ULTRASOUND COMPLETE COMPARISON:  Plain films of earlier today. FINDINGS: Gallbladder: No gallstones or wall thickening visualized. The patient is tender over the gallbladder. Common bile duct: Diameter: Normal, 3 mm. Liver: No focal lesion identified. Within normal limits in parenchymal echogenicity. IVC: No abnormality visualized. Pancreas: Partially obscured by bowel gas. Spleen: Size and appearance within normal limits. Right Kidney: Length: 11.8 cm. Echogenicity within normal limits. No mass or hydronephrosis visualized. Left Kidney: Length: 10.0 cm. Echogenicity within normal limits. No mass or hydronephrosis visualized. Abdominal aorta: No aneurysm visualized. Other findings: No ascites.  IMPRESSION: 1. No specific explanation for abdominal pain. 2. Per the technologist, the patient is tender over the gallbladder. However, there is no evidence of cholelithiasis, gallbladder wall thickening, or pericholecystic fluid. Electronically Signed   By: Abigail Miyamoto M.D.   On: 04/12/2017 18:19    Kathrene Alu, MD 04/14/2017, 7:23 AM PGY-1, [place] Intern pager: [phone], text pages welcome

## 2017-04-14 NOTE — Progress Notes (Signed)
Patient's mother called me, concerned re:  Her daughter's probable hypotention post emesis and diarrhea episode in BR approx. 20 minutes prior.  She was upset that her blood pressure "had not been taken immediately after the episode".  Patient was medicated with immodium and Mylicon after assessing her once she had returned to her bed.  She complained of chills and dizziness had dissipated.  B/P was assessed and warm blankets were brought to her.  Patient's mother stated that she would "be right down after I come off dialysis".  Patient checked at 1315.  Patient sleeping.

## 2017-04-14 NOTE — Progress Notes (Signed)
PT Cancellation Note  Patient Details Name: Linda [name] MRN: [id number] DOB: 03-26-1996   Cancelled Treatment:    Reason Eval/Treat Not Completed: PT screened, no needs identified, will sign off; observed pt in hallway walking with OT.  She practiced stairs with OT.  No current skilled PT needs identified.  Will sign off.    Reginia Naas 04/14/2017, 2:18 PM  Magda Kiel, [place] 04/14/2017

## 2017-04-14 NOTE — Progress Notes (Signed)
Patient refused to have lab draw for BMET.  States feels better than previously in the shift.

## 2017-04-14 NOTE — Progress Notes (Signed)
FPTS Interim Progress Note  S:Received a phone call during patient's discharge from nursing relaying that patient's mother was upset about lack of opioid pain prescriptions in the discharge plan.  O: BP (!) 163/105 (BP Location: Left Arm)   Pulse 72   Temp 98.4 F (36.9 C) (Oral)   Resp 16   Ht 5\' 6"  (1.676 m)   Wt 251 lb 1.6 oz (113.9 kg)   LMP 04/12/2017   SpO2 100%   BMI 40.53 kg/m     A/P: While speaking with the nurse about the patient's concern the patient's mother asked to speak to me.  She discussed how they felt they were promised opioid pain prescriptions during that morning's prerounds and were upset to hear that was not the plan.  I explained that I did not know what was said to them that morning but that the entire team had discussed the discharge plan with Dr. Shan Levans and the decision was made that ibuprofen was appropriate pain management.   I apologized for any confusion in communication and stated that I know they are frustrated but that not having opioids does not mean we don't have a plan for pain.  Approximately 39minutes later, while assisting another patient, I received a page that the patient was still in her room and requesting to speak to a doctor about the medications.   I went upstairs and we went over the discharge medication plan together including medication for her other symptoms in addition to pain.  We discussed that the reason ibuprofen was not on the prescribed medication list was because it is over the counter.  The patient's mother told me to leave her room and that she wanted to speak to another doctor instead.   I informed her that while I was sorry she was upset and again apologized for any miscommunication that her medication decisions were made and that she was discharged.   She said she wasn't leaving and I informed them unfortunately I would need to call security.  She said, "get them then" so I left and had the Ad[employer] account executive page security.  I  waited down the hall for security and then returned to the room so they could witness discharge instructions.   At that point that patient's mom said they were just waiting for a wheelchair and they wanted to know why I was bothering them.  I confirmed with security that they had what they needed from me and left.  Sherene Sires, DO 04/14/2017, 6:29 PM PGY-1, [facility] pager [phone]

## 2017-04-14 NOTE — Evaluation (Signed)
Occupational Therapy Evaluation Patient Details Name: Linda [name] MRN: [id number] DOB: 1996-02-27 Today's Date: 04/14/2017    History of Present Illness 21 y.o. female presenting with right knee pain and swelling . PMH is significant for connective tissue disease (not on medications), HTN, Obesity.   Clinical Impression   PTA, pt was living with her mom and boyfriend and was independent. Currently, pt performing ADLs and functional mobility at Mod I level for increased time. Recommend dc home once medically stable per physician. Answered all pt's questions and provided education. All acute OT needs met and will sign off. Thank you.     Follow Up Recommendations  No OT follow up    Equipment Recommendations  None recommended by OT    Recommendations for Other Services       Precautions / Restrictions Restrictions Weight Bearing Restrictions: No      Mobility Bed Mobility Overal bed mobility: Modified Independent             General bed mobility comments: Increased time  Transfers Overall transfer level: Modified independent               General transfer comment: Increased time    Balance Overall balance assessment: No apparent balance deficits (not formally assessed)                                         ADL either performed or assessed with clinical judgement   ADL Overall ADL's : Modified independent                                       General ADL Comments: Pt demonstrating ADLs and functional mobility with increased time. Feel she will progress with time.      Vision         Perception     Praxis      Pertinent Vitals/Pain Pain Assessment: Faces Pain Score: 10-Worst pain ever Faces Pain Scale: Hurts a little bit Pain Location: back pain Pain Descriptors / Indicators: Discomfort;Constant Pain Intervention(s): Monitored during session     Hand Dominance Right   Extremity/Trunk Assessment  Upper Extremity Assessment Upper Extremity Assessment: Overall WFL for tasks assessed   Lower Extremity Assessment Lower Extremity Assessment: Overall WFL for tasks assessed   Cervical / Trunk Assessment Cervical / Trunk Assessment: Normal   Communication Communication Communication: No difficulties   Cognition Arousal/Alertness: Awake/alert Behavior During Therapy: WFL for tasks assessed/performed Overall Cognitive Status: Within Functional Limits for tasks assessed                                     General Comments  Pt with 3N1 in room. She reports she didnt ask for it and it was brought to her room. Informed social worker that pt does not need 3N1    Exercises     Shoulder Instructions      Home Living Family/patient expects to be discharged to:: Private residence Living Arrangements: Spouse/significant other;Parent (mom and boyfriend) Available Help at Discharge: Available PRN/intermittently Type of Home: House Home Access: Stairs to enter [employer] of Steps: 4-5 Entrance Stairs-Rails: Can reach both [place]: One level     Bathroom Shower/Tub: Tub/shower unit;Curtain   Bathroom  Toilet: Standard     Home Equipment: None          Prior Functioning/Environment Level of Independence: Independent        Comments: ADL, IADLs, driving        OT Problem List: Decreased activity tolerance;Decreased safety awareness;Decreased knowledge of use of DME or AE;Pain      OT Treatment/Interventions:      OT Goals(Current goals can be found in the care plan section) Acute Rehab OT Goals Patient Stated Goal: Go home OT Goal Formulation: With patient Time For Goal Achievement: 04/28/17 Potential to Achieve Goals: Good  OT Frequency:     Barriers to D/C:            Co-evaluation              AM-PAC PT "6 Clicks" Daily Activity     Outcome Measure Help from another person eating meals?: None Help from another person  taking care of personal grooming?: None Help from another person toileting, which includes using toliet, bedpan, or urinal?: None Help from another person bathing (including washing, rinsing, drying)?: None Help from another person to put on and taking off regular upper body clothing?: None Help from another person to put on and taking off regular lower body clothing?: None 6 Click Score: 24   End of Session Nurse Communication: Mobility status  Activity Tolerance: Patient tolerated treatment well Patient left: in chair;with call bell/phone within reach  OT Visit Diagnosis: Pain Pain - Right/Left:  (Back) Pain - part of body:  (Back)                Time: 3112-1624 OT Time Calculation (min): 16 min Charges:  OT General Charges $OT Visit: 1 Procedure OT Evaluation $OT Eval Low Complexity: 1 Procedure G-Codes:     [place], OTR/L Acute Rehab Pager: [phone] Office: [place] 04/14/2017, 11:55 AM

## 2017-04-15 ENCOUNTER — Telehealth: Payer: Self-pay | Admitting: Internal Medicine

## 2017-04-15 LAB — H. PYLORI ANTIBODY, IGG: H Pylori IgG: <0.8 Index Value (ref 0.00–0.79)

## 2017-04-15 NOTE — Telephone Encounter (Signed)
Pt was discharged from the hospital yesterday. She understood she would receive some type of pain meds but she didn't get any. She is in severe pain.  She would like some percocet. Please advise

## 2017-04-16 NOTE — Telephone Encounter (Signed)
Called patient no answer, no voicemail. Per discharge note from Dr. Criss Rosales patient and mother were told mulitple times at discharge that they would not be receiving any narcotic pain medications. If pain continues to be severe patient will need to come in for an appointment.

## 2017-04-20 ENCOUNTER — Encounter (HOSPITAL_COMMUNITY): Payer: Self-pay | Admitting: Emergency Medicine

## 2017-04-20 ENCOUNTER — Inpatient Hospital Stay (HOSPITAL_COMMUNITY)
Admission: EM | Admit: 2017-04-20 | Discharge: 2017-04-22 | DRG: 546 | Disposition: A | Discharge status: Home / Self Care | Payer: Medicaid Other | Attending: Family Medicine | Admitting: Family Medicine

## 2017-04-20 DIAGNOSIS — M329 Systemic lupus erythematosus, unspecified: Secondary | ICD-10-CM | POA: Diagnosis present

## 2017-04-20 DIAGNOSIS — Z885 Allergy status to narcotic agent status: Secondary | ICD-10-CM | POA: Diagnosis not present

## 2017-04-20 DIAGNOSIS — D72829 Elevated white blood cell count, unspecified: Secondary | ICD-10-CM | POA: Diagnosis present

## 2017-04-20 DIAGNOSIS — E872 Acidosis: Secondary | ICD-10-CM | POA: Diagnosis present

## 2017-04-20 DIAGNOSIS — F17210 Nicotine dependence, cigarettes, uncomplicated: Secondary | ICD-10-CM | POA: Diagnosis present

## 2017-04-20 DIAGNOSIS — Z87898 Personal history of other specified conditions: Secondary | ICD-10-CM

## 2017-04-20 DIAGNOSIS — Z79899 Other long term (current) drug therapy: Secondary | ICD-10-CM | POA: Diagnosis not present

## 2017-04-20 DIAGNOSIS — X58XXXA Exposure to other specified factors, initial encounter: Secondary | ICD-10-CM | POA: Diagnosis present

## 2017-04-20 DIAGNOSIS — M25461 Effusion, right knee: Secondary | ICD-10-CM | POA: Diagnosis present

## 2017-04-20 DIAGNOSIS — N179 Acute kidney failure, unspecified: Secondary | ICD-10-CM | POA: Diagnosis present

## 2017-04-20 DIAGNOSIS — T380X5A Adverse effect of glucocorticoids and synthetic analogues, initial encounter: Secondary | ICD-10-CM | POA: Diagnosis present

## 2017-04-20 DIAGNOSIS — M7989 Other specified soft tissue disorders: Secondary | ICD-10-CM | POA: Diagnosis present

## 2017-04-20 DIAGNOSIS — M25462 Effusion, left knee: Secondary | ICD-10-CM

## 2017-04-20 DIAGNOSIS — E875 Hyperkalemia: Secondary | ICD-10-CM | POA: Diagnosis present

## 2017-04-20 DIAGNOSIS — Z8739 Personal history of other diseases of the musculoskeletal system and connective tissue: Secondary | ICD-10-CM

## 2017-04-20 DIAGNOSIS — M351 Other overlap syndromes: Secondary | ICD-10-CM | POA: Diagnosis present

## 2017-04-20 DIAGNOSIS — I10 Essential (primary) hypertension: Secondary | ICD-10-CM | POA: Diagnosis present

## 2017-04-20 LAB — CBC WITH DIFFERENTIAL/PLATELET
Basophils Absolute: 0 10*3/uL (ref 0.0–0.1)
Basophils Relative: 0 %
Eosinophils Absolute: 0.5 10*3/uL (ref 0.0–0.7)
Eosinophils Relative: 2 %
HCT: 41.2 % (ref 36.0–46.0)
Hemoglobin: 13.7 g/dL (ref 12.0–15.0)
Lymphocytes Relative: 19 %
Lymphs Abs: 4.6 10*3/uL — ABNORMAL HIGH (ref 0.7–4.0)
MCH: 31 pg (ref 26.0–34.0)
MCHC: 33.3 g/dL (ref 30.0–36.0)
MCV: 93.2 fL (ref 78.0–100.0)
Monocytes Absolute: 2.7 10*3/uL — ABNORMAL HIGH (ref 0.1–1.0)
Monocytes Relative: 11 %
Neutro Abs: 16.4 10*3/uL — ABNORMAL HIGH (ref 1.7–7.7)
Neutrophils Relative %: 68 %
Platelets: 259 10*3/uL (ref 150–400)
RBC: 4.42 MIL/uL (ref 3.87–5.11)
RDW: 13.4 % (ref 11.5–15.5)
WBC: 24.2 10*3/uL — ABNORMAL HIGH (ref 4.0–10.5)

## 2017-04-20 LAB — BASIC METABOLIC PANEL
Anion gap: 17 — ABNORMAL HIGH (ref 5–15)
Anion gap: 8 (ref 5–15)
BUN: 11 mg/dL (ref 6–20)
BUN: 7 mg/dL (ref 6–20)
CO2: 16 mmol/L — ABNORMAL LOW (ref 22–32)
CO2: 26 mmol/L (ref 22–32)
Calcium: 9.1 mg/dL (ref 8.9–10.3)
Calcium: 8.8 mg/dL — ABNORMAL LOW (ref 8.9–10.3)
Chloride: 101 mmol/L (ref 101–111)
Chloride: 98 mmol/L — ABNORMAL LOW (ref 101–111)
Creatinine, Ser: 1.09 mg/dL — ABNORMAL HIGH (ref 0.44–1.00)
Creatinine, Ser: 1.04 mg/dL — ABNORMAL HIGH (ref 0.44–1.00)
GFR calc Af Amer: >60 mL/min (ref >60)
GFR calc Af Amer: >60 mL/min (ref >60)
GFR calc non Af Amer: >60 mL/min (ref >60)
GFR calc non Af Amer: >60 mL/min (ref >60)
Glucose, Bld: 73 mg/dL (ref 65–99)
Glucose, Bld: 99 mg/dL (ref 65–99)
Potassium: 5.7 mmol/L — ABNORMAL HIGH (ref 3.5–5.1)
Potassium: 3.9 mmol/L (ref 3.5–5.1)
Sodium: 134 mmol/L — ABNORMAL LOW (ref 135–145)
Sodium: 132 mmol/L — ABNORMAL LOW (ref 135–145)

## 2017-04-20 LAB — C-REACTIVE PROTEIN: CRP: 4.9 mg/dL — ABNORMAL HIGH (ref <1.0)

## 2017-04-20 LAB — SEDIMENTATION RATE: Sed Rate: 32 mm/hr — ABNORMAL HIGH (ref 0–22)

## 2017-04-20 LAB — CK: Total CK: 46 U/L (ref 38–234)

## 2017-04-20 LAB — TSH: TSH: 3.322 u[IU]/mL (ref 0.350–4.500)

## 2017-04-20 LAB — POC URINE PREG, ED: Preg Test, Ur: NEGATIVE

## 2017-04-20 MED ORDER — SODIUM CHLORIDE 0.9% FLUSH
3.0000 mL | Freq: Two times a day (BID) | INTRAVENOUS | Status: DC
Start: 2017-04-20 — End: 2017-04-22
  Administered 2017-04-20 – 2017-04-22 (×3): 3 mL via INTRAVENOUS

## 2017-04-20 MED ORDER — FAMOTIDINE 20 MG PO TABS: 20.0000 mg | ORAL_TABLET | Freq: Every day | ORAL | Status: DC | PRN

## 2017-04-20 MED ORDER — MORPHINE SULFATE (PF) 4 MG/ML IV SOLN
4.0000 mg | Freq: Once | INTRAVENOUS | Status: AC
  Administered 2017-04-20: 4 mg via INTRAVENOUS
  Filled 2017-04-20: qty 1

## 2017-04-20 MED ORDER — ACETAMINOPHEN 325 MG PO TABS
650.0000 mg | ORAL_TABLET | Freq: Three times a day (TID) | ORAL | Status: DC
  Administered 2017-04-20 – 2017-04-22 (×4): 650 mg via ORAL
  Filled 2017-04-20 (×5): qty 2

## 2017-04-20 MED ORDER — METHYLPREDNISOLONE SODIUM SUCC 125 MG IJ SOLR
60.0000 mg | Freq: Every day | INTRAMUSCULAR | Status: DC
  Administered 2017-04-20: 60 mg via INTRAVENOUS
  Filled 2017-04-20: qty 2

## 2017-04-20 MED ORDER — HYDROMORPHONE HCL 1 MG/ML IJ SOLN
1.0000 mg | INTRAMUSCULAR | Status: DC | PRN
  Administered 2017-04-20 – 2017-04-22 (×9): 1 mg via INTRAVENOUS
  Filled 2017-04-20 (×11): qty 1

## 2017-04-20 MED ORDER — SODIUM CHLORIDE 0.9% FLUSH: 3.0000 mL | INTRAVENOUS | Status: DC | PRN

## 2017-04-20 MED ORDER — LIDOCAINE 4 % EX CREA
TOPICAL_CREAM | Freq: Three times a day (TID) | CUTANEOUS | Status: DC | PRN
  Administered 2017-04-21: 1 via TOPICAL
  Filled 2017-04-20 (×2): qty 5

## 2017-04-20 MED ORDER — HYDRALAZINE HCL 20 MG/ML IJ SOLN
10.0000 mg | Freq: Four times a day (QID) | INTRAMUSCULAR | Status: DC | PRN
  Filled 2017-04-20: qty 1

## 2017-04-20 MED ORDER — AMLODIPINE BESYLATE 10 MG PO TABS
10.0000 mg | ORAL_TABLET | Freq: Every day | ORAL | Status: DC
  Administered 2017-04-20 – 2017-04-22 (×3): 10 mg via ORAL
  Filled 2017-04-20 (×3): qty 1

## 2017-04-20 MED ORDER — ENOXAPARIN SODIUM 60 MG/0.6ML ~~LOC~~ SOLN
50.0000 mg | SUBCUTANEOUS | Status: DC
  Administered 2017-04-20 – 2017-04-21 (×2): 50 mg via SUBCUTANEOUS
  Filled 2017-04-20 (×2): qty 0.6

## 2017-04-20 MED ORDER — LOPERAMIDE HCL 2 MG PO CAPS: 2.0000 mg | ORAL_CAPSULE | ORAL | Status: DC | PRN

## 2017-04-20 MED ORDER — OXYCODONE-ACETAMINOPHEN 5-325 MG PO TABS
ORAL_TABLET | ORAL | Status: AC
  Filled 2017-04-20: qty 1

## 2017-04-20 MED ORDER — SODIUM CHLORIDE 0.9 % IV SOLN: 250.0000 mL | INTRAVENOUS | Status: DC | PRN

## 2017-04-20 MED ORDER — OXYCODONE-ACETAMINOPHEN 5-325 MG PO TABS
1.0000 | ORAL_TABLET | ORAL | Status: DC | PRN
  Administered 2017-04-20: 1 via ORAL
  Filled 2017-04-20: qty 1

## 2017-04-20 MED ORDER — SODIUM POLYSTYRENE SULFONATE 15 GM/60ML PO SUSP
15.0000 g | Freq: Once | ORAL | Status: AC
  Administered 2017-04-20: 15 g via ORAL
  Filled 2017-04-20: qty 60

## 2017-04-20 NOTE — Progress Notes (Signed)
Report received from the ED pt arrived to the unit at 1730 via bed.

## 2017-04-20 NOTE — ED Triage Notes (Signed)
Pt here for worsening cellulitis to lower legs; pt with hx of same discharged 2 days ago; pt sts worsening despite home meds

## 2017-04-20 NOTE — Progress Notes (Signed)
Linda [name] is a 21 y.o. female patient admitted from ED awake, alert - oriented  X 4 - no acute distress noted.  VSS - Blood pressure (!) 149/97, pulse (!) 101, temperature 98.7 F (37.1 C), temperature source Oral, resp. rate 16, height 5\' 6"  (1.676 m), weight 102.5 kg (226 lb), last menstrual period 04/12/2017, SpO2 100 %.    IV in place, occlusive dsg intact without redness.  Orientation to room, and floor completed with information packet given to patient/family.  Patient declined safety video at this time.  Admission INP armband ID verified with patient/family, and in place.   SR up x 2, fall assessment complete, with patient and family able to verbalize understanding of risk associated with falls, and verbalized understanding to call nsg before up out of bed.  Call light within reach, patient able to voice, and demonstrate understanding.  Skin, clean-dry- intact with evidence of rash all over skin. No evidence of skin break down noted on exam.   Will cont to eval and treat per MD orders.  Dorris Carnes, RN 04/20/2017 11:46 PM

## 2017-04-20 NOTE — H&P (Signed)
[facility] Admission History and Physical Service Pager: [phone]  Patient name: Linda [name] Medical record number: [id number] Date of birth: 1996-07-02 Age: 21 y.o. Gender: female  Primary Care Provider: Tonette Bihari, MD Consultants: None Code Status: Full   Chief Complaint: Bilateral knee pain and swelling  Assessment and Plan: Linda [name] is a 21 y.o. female presenting with right knee pain and swelling . PMH is significant for lupus (not on medications), connective tissue disease (not on medications), HTN, Obesity.  Bilateral Knee Pain/Swelling, R>L:  Recently discharged on August 15th with right knee pain and swelling thought to be due to either infectious vs autoimmune etiology, improved with antibiotics and steroids (vanc/zosyn and prednisone 50 mg) prior to discharge. Right knee pain and swelling continued to get worse at home per patient report and then developed left knee pain and swelling today. Afebrile. Hypertensive on admission to 152/102, pulse 95, normal respiratory rate, O2 100% on room air. Exam showing tender, swollen, and warm bilateral knees R>L, no clear erythema. Difficult to see patellar landmarks on right knee and exam limited by patient pain tolerance. BMP showing potassium 5.7, creatinine 1.09, low bicarbonate 16 with increased anion gap and 17. CBC showing leukocytosis to 24.2 (could possibly be due to steroid use). Suspect that knee pain and swelling likely autoimmune with history of connective tissue disease versus infectious etiology at this time. No hx of trauma. No signs of rhabdo with CK of 46. -Admit to family medicine teaching service, MedSurg, attending Dr. Mingo Amber -Vitals per floor protocol -Scheduled Tylenol 3 times a day -Dilaudid q4 hr PRN for severe pain -IV steroids with IV Solu-Medrol 80 mg daily -MRI with and without contrast of both knees -Can add antibiotics if patient not improving with higher dose  of steroids or she becomes febrile -Can consider rheumatology consult  -ESR, CRP, ANA, rheumatoid factor, lupus panel  -Continue home pepcid with steroid use  HTN: elevated BP likely a combination of uncontrolled HTN but pain is likely significantly contributing. Suspect blood pressure will improve with pain control. Lisinopril and HCTZ were previous meds that were taken but stopped due to rising Cr.  - Dilaudid as above - restart home Amlodipine 10 mg daily  -Hydralazine when necessary with parameters  Leukocytosis: White blood cell count 24.2. Likely from steroid use. However could also be due to infectious etiology, left shift with increased neutrophils however all differential lines up.  -daily CBC  Hyperkalemia: Potassium 5.7 on admission. Unclear what caused this - EKG - Repeat BMP - Kayexalate   ?Metabolic acidosis: bicarb on admission low at 16 and AG elevated to 17. Unclear why. No signs of DKA. ?home ASA use, will need to clarify which medications patient taking at home for pain.  - Repeat BMP - Oral rehydration  AKI: Cr 1.09 on admission. Baseline appears to be 1. ?Prerenal vs intrarenal secondary to lupus.  - Daily BMP - Avoid nephrotoxic agents  History of connective tissue disease and? Lupus: Likely contributing to knee pain -Autoimmune labs as above -Consider rheumatology consult inpatient  FEN/GI: Saline lock IV, regular diet  Prophylaxis: Lovenox   Disposition: Admit to family medicine teaching service, MedSurg, attending Dr. Mingo Amber  History of Present Illness:  Linda [name] is a 21 y.o. female presenting with bilateral knee pain and swelling  Patient coming in to the ED today for bilateral knee pain and swelling. She was actually discharged from the hospital on August 15 for right knee pain  and swelling. At that time it was thought to be due to either an infection or because of autoimmune issues, she was given antibiotics and steroids. Upon discharge her  ear pain and swelling had improved.  Patient notes that since going home on August 15 she has felt worse in terms of her right knee pain and swelling. She notes that her left knee started hurting today and was worrisome to her as it began to swell. The pain in her knees is described as burning/stinging and is worse when "air touches it". No trauma. She is unable to bend her right knee due to the pain but is able to bend her left knee.  Patient denies any fevers at home and states that her highest temperature was 99.8. She knows that she has had a good appetite and eating and drinking normally. She is still taking the prednisone that she was discharged with (20 mg daily) and finish her last dose of antibiotics last night. She notes that she has had 1 episode of emesis yesterday and some intermittent loose stools.   Review Of Systems: Per HPI with the following additions: see HPI  ROS  Patient Active Problem List   Diagnosis Date Noted  . Abdominal pain   . Connective tissue disease ([place])   . Cellulitis 04/09/2017  . Hypertension 12/11/2016  . Mixed connective tissue disease ([place]) 02/11/2016  . High BMI 01/19/2014  . Implanon in place 01/19/2014    Past Medical History: Past Medical History:  Diagnosis Date  . Lupus   . Migraines   . Mixed connective tissue disease ([place])     Past Surgical History: Past Surgical History:  Procedure Laterality Date  . MULTIPLE TOOTH EXTRACTIONS      Social History: Social History  Substance Use Topics  . Smoking status: Current Every Day Smoker    Packs/day: 0.00    Types: Cigarettes, Cigars    Start date: 02/11/2015  . Smokeless tobacco: Never Used  . Alcohol use No     Comment: social   Please also refer to relevant sections of EMR.  Family History: Family History  Problem Relation Age of Onset  . Lupus Mother   . Hypertension Mother   . Kidney disease Mother   . Diabetes Maternal Grandmother     Allergies and  Medications: Allergies  Allergen Reactions  . Vicodin [Hydrocodone-Acetaminophen] Itching and Rash   Current Facility-Administered Medications on File Prior to Encounter  Medication Dose Route Frequency Provider Last Rate Last Dose  . etonogestrel (IMPLANON) implant 68 mg  68 mg Subcutaneous Once Shelly Bombard, MD       Current Outpatient Prescriptions on File Prior to Encounter  Medication Sig Dispense Refill  . amLODipine (NORVASC) 10 MG tablet Take 1 tablet (10 mg total) by mouth daily. 30 tablet 0  . famotidine (PEPCID) 20 MG tablet Take 1 tablet (20 mg total) by mouth daily as needed for heartburn or indigestion. 60 tablet 0  . lidocaine (LMX) 4 % cream Apply topically 3 (three) times daily as needed (pain). 30 g 0  . loperamide (IMODIUM) 2 MG capsule Take 1 capsule (2 mg total) by mouth as needed for diarrhea or loose stools. 15 capsule 0    Objective: BP (!) 152/102 (BP Location: Right Arm)   Pulse 95   Temp 99.3 F (37.4 C)   Resp 16   Ht _0  (1.676 m)   Wt 226 lb (102.5 kg)   LMP 04/12/2017   SpO2  100%   BMI 36.48 kg/m  Exam: General: Obese African-American female, laying in bed in no acute distress but tearful Eyes: Pupils equal and reactive to light ENTM: Slightly dry mucous membrane Neck: Supple Cardiovascular: Regular rate and rhythm, normal S1-S2, no murmurs appreciated Respiratory: Normal work of breathing, clear to auscultation bilaterally Gastrointestinal: Soft, nontender, nondistended, obese abdomen, normal bowel sounds MSK: Moves upper extremities spontaneously. Unable to flex right knee due to pain. Able to flex left knee with some pain. Bilateral knees swollen and warm with no erythema. Difficult to identify landmarks of right patella secondary to swelling. No fluctuance appreciated. Derm: Diffuse circumferential areas of hypopigmentation and crusting involving all surfaces of the skin  Neuro: Alert and oriented 3, normal speech, sensation grossly  intact throughout Psych: normal mood, tearful affect  Labs and Imaging: CBC BMET   Recent Labs Lab 04/20/17 1600  WBC 24.2*  HGB 13.7  HCT 41.2  PLT 259    Recent Labs Lab 04/14/17 1149  NA 138  K 3.6  CL 106  CO2 22  BUN 13  CREATININE 1.34*  GLUCOSE 105*  CALCIUM 9.3     No results found.   Carlyle Dolly, MD 04/20/2017, 6:24 PM PGY-3, [facility] pager: [phone], text pages welcome

## 2017-04-20 NOTE — ED Provider Notes (Signed)
[facility] DEPT Provider Note   CSN: [id number] Arrival date & time: 04/20/17  1100     History   Chief Complaint Chief Complaint  Patient presents with  . Leg Swelling    HPI Linda [name] is a 21 y.o. female.  Patient is a 21 year old female with history of next connective tissue disease, lupus, and migraines. She presents for evaluation of leg swelling, redness, and pain. This is been present for the past 2 weeks. She was recently admitted for 6 days for a similar problem. She was treated with IV antibiotics and steroids and eventually improved. She has been home for the past 2 days, however is now experiencing increased pain, redness, and swelling to both legs. She denies any fevers or chills. She denies any chest pain or difficulty breathing. Pain is worse with ambulation and there are no alleviating factors.   The history is provided by the patient.    Past Medical History:  Diagnosis Date  . Lupus   . Migraines   . Mixed connective tissue disease [facility])     Patient Active Problem List   Diagnosis Date Noted  . Abdominal pain   . Connective tissue disease ([place])   . Cellulitis 04/09/2017  . Hypertension 12/11/2016  . Mixed connective tissue disease ([place]) 02/11/2016  . High BMI 01/19/2014  . Implanon in place 01/19/2014    Past Surgical History:  Procedure Laterality Date  . MULTIPLE TOOTH EXTRACTIONS      OB History    Gravida Para Term Preterm AB Living   0 0 0 0 0 0   SAB TAB Ectopic Multiple Live Births   0 0 0 0         Home Medications    Prior to Admission medications   Medication Sig Start Date End Date Taking? Authorizing Provider  amLODipine (NORVASC) 10 MG tablet Take 1 tablet (10 mg total) by mouth daily. 04/15/17   Carlyle Dolly, MD  famotidine (PEPCID) 20 MG tablet Take 1 tablet (20 mg total) by mouth daily as needed for heartburn or indigestion. 04/14/17   Carlyle Dolly, MD  lidocaine (LMX) 4 % cream Apply topically 3  (three) times daily as needed (pain). 04/14/17   Carlyle Dolly, MD  loperamide (IMODIUM) 2 MG capsule Take 1 capsule (2 mg total) by mouth as needed for diarrhea or loose stools. 04/14/17   Carlyle Dolly, MD    Family History Family History  Problem Relation Age of Onset  . Lupus Mother   . Hypertension Mother   . Kidney disease Mother   . Diabetes Maternal Grandmother     Social History Social History  Substance Use Topics  . Smoking status: Current Every Day Smoker    Packs/day: 0.00    Types: Cigarettes, Cigars    Start date: 02/11/2015  . Smokeless tobacco: Never Used  . Alcohol use No     Comment: social     Allergies   Vicodin [hydrocodone-acetaminophen]   Review of Systems Review of Systems  All other systems reviewed and are negative.    Physical Exam Updated Vital Signs BP (!) 152/102 (BP Location: Right Arm)   Pulse 95   Temp 99.3 F (37.4 C)   Resp 16   Ht 5\' 6"  (1.676 m)   Wt 102.5 kg (226 lb)   LMP 04/12/2017   SpO2 100%   BMI 36.48 kg/m   Physical Exam  Constitutional: She is oriented to person, place, and time.  She appears well-developed and well-nourished. No distress.  HENT:  Head: Normocephalic and atraumatic.  Neck: Normal range of motion. Neck supple.  Cardiovascular: Normal rate and regular rhythm.  Exam reveals no gallop and no friction rub.   No murmur heard. Pulmonary/Chest: Effort normal and breath sounds normal. No respiratory distress. She has no wheezes.  Abdominal: Soft. Bowel sounds are normal. She exhibits no distension. There is no tenderness.  Musculoskeletal: Normal range of motion.  Both lower extremities are swollen, erythematous, and tender to the touch. DP pulses are easily palpable bilaterally.  Neurological: She is alert and oriented to person, place, and time.  Skin: Skin is warm and dry. She is not diaphoretic.  Nursing note and vitals reviewed.    ED Treatments / Results  Labs (all labs ordered  are listed, but only abnormal results are displayed) Labs Reviewed  BASIC METABOLIC PANEL  CBC WITH DIFFERENTIAL/PLATELET  I-STAT BETA HCG BLOOD, ED ([facility], [facility], [facility] ONLY)    EKG  EKG Interpretation None       Radiology No results found.  Procedures Procedures (including critical care time)  Medications Ordered in ED Medications  oxyCODONE-acetaminophen (PERCOCET/ROXICET) 5-325 MG per tablet 1 tablet (1 tablet Oral Given 04/20/17 1113)  oxyCODONE-acetaminophen (PERCOCET/ROXICET) 5-325 MG per tablet (not administered)     Initial Impression / Assessment and Plan / ED Course  I have reviewed the triage vital signs and the nursing notes.  Pertinent labs & imaging results that were available during my care of the patient were reviewed by me and considered in my medical decision making (see chart for details).  Patient presents here with increased leg pain and swelling. She was recently admitted for cellulitis. Her laboratory studies are pending. She will be evaluated by the family practice service who will determine the final disposition.  Final Clinical Impressions(s) / ED Diagnoses   Final diagnoses:  None    New Prescriptions New Prescriptions   No medications on file     Veryl Speak, MD 04/20/17 1654

## 2017-04-20 NOTE — ED Notes (Signed)
Admitting at bedside 

## 2017-04-20 NOTE — ED Notes (Signed)
Iv team at bedside at this time 

## 2017-04-21 ENCOUNTER — Inpatient Hospital Stay (HOSPITAL_COMMUNITY): Payer: Medicaid Other

## 2017-04-21 LAB — CBC
HCT: 39.8 % (ref 36.0–46.0)
Hemoglobin: 13 g/dL (ref 12.0–15.0)
MCH: 30.3 pg (ref 26.0–34.0)
MCHC: 32.7 g/dL (ref 30.0–36.0)
MCV: 92.8 fL (ref 78.0–100.0)
Platelets: 274 10*3/uL (ref 150–400)
RBC: 4.29 MIL/uL (ref 3.87–5.11)
RDW: 13.1 % (ref 11.5–15.5)
WBC: 28.8 10*3/uL — ABNORMAL HIGH (ref 4.0–10.5)

## 2017-04-21 LAB — BASIC METABOLIC PANEL
Anion gap: 9 (ref 5–15)
BUN: 11 mg/dL (ref 6–20)
CO2: 26 mmol/L (ref 22–32)
Calcium: 9.5 mg/dL (ref 8.9–10.3)
Chloride: 100 mmol/L — ABNORMAL LOW (ref 101–111)
Creatinine, Ser: 0.91 mg/dL (ref 0.44–1.00)
GFR calc Af Amer: >60 mL/min (ref >60)
GFR calc non Af Amer: >60 mL/min (ref >60)
Glucose, Bld: 168 mg/dL — ABNORMAL HIGH (ref 65–99)
Potassium: 4.3 mmol/L (ref 3.5–5.1)
Sodium: 135 mmol/L (ref 135–145)

## 2017-04-21 MED ORDER — MELOXICAM 7.5 MG PO TABS
15.0000 mg | ORAL_TABLET | Freq: Every day | ORAL | Status: DC
  Administered 2017-04-22: 15 mg via ORAL
  Filled 2017-04-21 (×2): qty 2

## 2017-04-21 MED ORDER — HYDROCHLOROTHIAZIDE 25 MG PO TABS
25.0000 mg | ORAL_TABLET | Freq: Every day | ORAL | Status: DC
  Administered 2017-04-21 – 2017-04-22 (×2): 25 mg via ORAL
  Filled 2017-04-21 (×2): qty 1

## 2017-04-21 MED ORDER — METHYLPREDNISOLONE SODIUM SUCC 125 MG IJ SOLR
80.0000 mg | Freq: Every day | INTRAMUSCULAR | Status: DC
  Administered 2017-04-21: 80 mg via INTRAVENOUS
  Filled 2017-04-21: qty 2

## 2017-04-21 MED ORDER — SUCRALFATE 1 G PO TABS
1.0000 g | ORAL_TABLET | Freq: Three times a day (TID) | ORAL | Status: DC
  Administered 2017-04-21 – 2017-04-22 (×5): 1 g via ORAL
  Filled 2017-04-21 (×5): qty 1

## 2017-04-21 MED ORDER — GADOBENATE DIMEGLUMINE 529 MG/ML IV SOLN
20.0000 mL | Freq: Once | INTRAVENOUS | Status: AC
  Administered 2017-04-21: 20 mL via INTRAVENOUS

## 2017-04-21 NOTE — Progress Notes (Signed)
Family Medicine Teaching Service Daily Progress Note Intern Pager: [phone]  Patient name: HAJRA PORT Medical record number: [id number] Date of birth: 1996/01/20 Age: 21 y.o. Gender: female  Primary Care Provider: Tonette Bihari, MD Consultants: None Code Status: Full  Pt Overview and Major Events to Date:  ZENORA KARPEL a 21 y.o.femalepresenting with right knee pain and swelling. PMH is significant for lupus (not on medications), connective tissue disease (not on medications), HTN, Obesity.  Assessment and Plan:  Bilateral Knee Pain/Swelling, R>L: Recently discharged 8/15 with this. Suspect that knee pain and swelling likely autoimmune with history of connective tissue disease versus infectious etiology at this time.  -Scheduled Tylenol 3 times a day -Dilaudid q4 hr PRN for severe pain - Mobiq added for pain- monitor Cr -IV steroids with IV Solu-Medrol 80 mg daily -MRI with and without contrast of both knees -Consider rheumatology consult- calling [facility] -ESR, CRP, ANA, rheumatoid factor, lupus panel pending -Continue home pepcid with steroid use, sucralfate started 8/22  HTN: uncontrolled HTN but pain is likely significantly contributing. Lisinopril and HCTZ were stopped due to rising Cr.  - Dilaudid as above - restart home Amlodipine 10 mg daily  -Hydralazine when necessary with SBP>180, DBP>100- given once 8/21 - Restarted home HCTZ '25mg'$   Leukocytosis: White blood cell count 24.2> 28.8 on 8/22. Likely from steroid use. However could also be due to infectious etiology, left shift with increased neutrophils.  -daily CBC  Hyperkalemia: Resolved. Potassium 5.7 on admission> 4.3 on 8/22. Unclear what caused this - Kayexalate   ?Metabolic acidosis: Resolved. AG elevated to 17 8/21> 9. Unclear why. No signs of DKA. home ASA use?  - Repeat BMP - Oral rehydration  AKI: Resolved. Cr 0.97 on 08/22. Baseline appears to be 1. ?Prerenal vs intrarenal  secondary to lupus.  - Daily BMP  History of connective tissue disease and? Lupus: Likely contributing to knee pain -Autoimmune labs as above -Consider rheumatology consult inpatient  FEN/GI: Saline lock IV, regular diet  Prophylaxis: Lovenox   Disposition: Likely Home  Subjective:  Patient sitting up in bed resting. Says she feels much better this am. She has few complaints. She is worried she may have kidney failure bc of her low back pain. Reassured her. She had a normal BM yesterday. Her pain is under control.   Objective: Temp:  [97.5 F (36.4 C)-99.3 F (37.4 C)] 97.5 F (36.4 C) (08/22 0445) Pulse Rate:  [79-113] 80 (08/22 0539) Resp:  [16-18] 18 (08/22 0445) BP: (142-190)/(97-123) 142/98 (08/22 0539) SpO2:  [100 %] 100 % (08/22 0445) Weight:  [226 lb (102.5 kg)] 226 lb (102.5 kg) (08/21 1108) Physical Exam: General: NAD, pleasant Cardiovascular: Regular rate and rhythm, normal S1-S2, no murmurs appreciated Respiratory: Normal work of breathing, clear to auscultation bilaterally Gastrointestinal: Soft, nontender, nondistended, obese abdomen, normal bowel sounds Extremities: Moves upper extremities spontaneously. Unable to flex right knee due to pain. Able to flex left knee with some pain. Bilateral knees swollen and warm with no erythema. Difficult to identify landmarks of right patella secondary to swelling. No fluctuance appreciated. Very tender on palpation.   Laboratory:  Recent Labs Lab 04/20/17 1600 04/21/17 0806  WBC 24.2* 28.8*  HGB 13.7 13.0  HCT 41.2 39.8  PLT 259 274    Recent Labs Lab 04/20/17 1600 04/20/17 2201 04/21/17 0806  NA 134* 132* 135  K 5.7* 3.9 4.3  CL 101 98* 100*  CO2 16* 26 26  BUN '11 7 11  '$ CREATININE 1.09*  1.04* 0.91  CALCIUM 9.1 8.8* 9.5  GLUCOSE 73 99 168*   ESR: 32 CRP: 4.9 CK Total: 46 AG: 9  Imaging/Diagnostic Tests: No results found.  Shirley, [place], DO 04/21/2017, 9:51 AM PGY-1, [place] Intern pager: [phone], text pages welcome

## 2017-04-21 NOTE — Progress Notes (Signed)
Pt requests to keep bed in high position, advised her of safety concerns she requests to keep bed elevated but she states she will  place bed in lower position before getting out of bed.

## 2017-04-22 ENCOUNTER — Inpatient Hospital Stay: Payer: Medicaid Other | Admitting: Internal Medicine

## 2017-04-22 LAB — BASIC METABOLIC PANEL
Anion gap: 9 (ref 5–15)
BUN: 14 mg/dL (ref 6–20)
CO2: 27 mmol/L (ref 22–32)
Calcium: 9.7 mg/dL (ref 8.9–10.3)
Chloride: 99 mmol/L — ABNORMAL LOW (ref 101–111)
Creatinine, Ser: 0.93 mg/dL (ref 0.44–1.00)
GFR calc Af Amer: >60 mL/min (ref >60)
GFR calc non Af Amer: >60 mL/min (ref >60)
Glucose, Bld: 163 mg/dL — ABNORMAL HIGH (ref 65–99)
Potassium: 4.2 mmol/L (ref 3.5–5.1)
Sodium: 135 mmol/L (ref 135–145)

## 2017-04-22 LAB — RHEUMATOID FACTOR: Rhuematoid fact SerPl-aCnc: <10 IU/mL (ref 0.0–13.9)

## 2017-04-22 LAB — CBC
HCT: 39.5 % (ref 36.0–46.0)
Hemoglobin: 12.9 g/dL (ref 12.0–15.0)
MCH: 30.7 pg (ref 26.0–34.0)
MCHC: 32.7 g/dL (ref 30.0–36.0)
MCV: 94 fL (ref 78.0–100.0)
Platelets: 265 10*3/uL (ref 150–400)
RBC: 4.2 MIL/uL (ref 3.87–5.11)
RDW: 13.3 % (ref 11.5–15.5)
WBC: 32.9 10*3/uL — ABNORMAL HIGH (ref 4.0–10.5)

## 2017-04-22 LAB — EXTRACTABLE NUCLEAR ANTIGEN ANTIBODY
ENA SM Ab Ser-aCnc: <0.2 AI (ref 0.0–0.9)
Ribonucleic Protein: 1.6 AI — ABNORMAL HIGH (ref 0.0–0.9)
SSA (Ro) (ENA) Antibody, IgG: <0.2 AI (ref 0.0–0.9)
SSB (La) (ENA) Antibody, IgG: <0.2 AI (ref 0.0–0.9)
Scleroderma (Scl-70) (ENA) Antibody, IgG: <0.2 AI (ref 0.0–0.9)
ds DNA Ab: 2 IU/mL (ref 0–9)

## 2017-04-22 LAB — ANTI-JO 1 ANTIBODY, IGG: Anti JO-1: <0.2 AI (ref 0.0–0.9)

## 2017-04-22 MED ORDER — PREDNISONE 20 MG PO TABS: 60.0000 mg | ORAL_TABLET | Freq: Every day | ORAL | 0 refills | Status: AC

## 2017-04-22 MED ORDER — OXYCODONE-ACETAMINOPHEN 5-325 MG PO TABS: 1.0000 | ORAL_TABLET | Freq: Four times a day (QID) | ORAL | 0 refills | Status: AC | PRN

## 2017-04-22 MED ORDER — HYDROCODONE-ACETAMINOPHEN 5-325 MG PO TABS: 1.0000 | ORAL_TABLET | Freq: Four times a day (QID) | ORAL | 0 refills | Status: DC | PRN

## 2017-04-22 MED ORDER — MELOXICAM 15 MG PO TABS: 15.0000 mg | ORAL_TABLET | Freq: Every day | ORAL | 0 refills | Status: DC

## 2017-04-22 MED ORDER — HYDROCHLOROTHIAZIDE 25 MG PO TABS: 25.0000 mg | ORAL_TABLET | Freq: Every day | ORAL | 0 refills | Status: DC

## 2017-04-22 NOTE — Progress Notes (Signed)
Family Medicine Teaching Service Daily Progress Note Intern Pager: [phone]  Patient name: Linda [name] Medical record number: [id number] Date of birth: August 22, 1996 Age: 21 y.o. Gender: female  Primary Care Provider: Tonette Bihari, MD Consultants: None Code Status: Full  Pt Overview and Major Events to Date:  Linda [name] a 21 y.o.femalepresenting with right knee pain and swelling. PMH is significant for lupus (not on medications), Mixed Connective Tissue Disease (unmedicated), HTN, Obesity.  Assessment and Plan:  Bilateral Knee Pain/Swelling, R>L:Stable. Most likely 2/2 to Mixed Connective Tissue disease. MRI Left Knee showed nl joint, edema in the SQ fat anterior to the knee w/ soft tissue calc seen in dermatomyositis/lupus, could represent sterile edema/cellulitis. MRI right knee nl joint, no joint effusion, calc/edema in the SQ fat anterior to the knee consistent with dermatomyositis or lupus. Unable to get Rheum consult to [facility], can get outpatient apt w/in 5 days discharge. ESR 32,  -Scheduled Tylenol 3 times a day -Dilaudid q4 hr PRN for severe pain - d/c iv dilaudid, send home w/ 2 mg PO dilaudid q4h - Mobiq added for pain- monitor Cr -IV steroids with IV Solu-Medrol 80 mg daily -ESR 32, CRP, ANA, rheumatoid factor, lupus panel pending -Continue home pepcid with steroid use, sucralfate started 8/22 -d/c w/ oral pred 40 mg, given failed '20mg'$  at home  HTN: 150s/100s. uncontrolled HTN but pain is likely significantly contributing. Lisinopril and HCTZ were stopped due to rising Cr.  - Dilaudid as above - Amlodipine 10 mg daily  -Hydralazine when necessary with SBP>180, DBP>100- given once 8/21 -  HCTZ '25mg'$   Leukocytosis: Increased to 32.9.Afebrile. 8/23. Likely from steroid use. No s/s of active infection. MRI left knee read as possible cellulitis. Not likely given joint not hot, erythematous -daily CBC  Hyperkalemia: Resolved. Potassium 5.7 on admission>  4.3 on 8/22. Unclear what caused this - Kayexalate   ?Metabolic acidosis: Resolved. AG elevated to 17 8/21> 9. Unclear why. No signs of DKA. home ASA use?  - Repeat BMP - Oral rehydration  AKI: Resolved. Cr 0.97 on 08/22. Baseline appears to be 1. ?Prerenal vs intrarenal secondary to lupus.  - Daily BMP  Mixed Connective Tissue Disease: Likely contributing to knee pain -Autoimmune labs as above -f/u w/ rhuem out pt  FEN/GI: Saline lock IV, regular diet  Prophylaxis: Lovenox   Disposition: Home, w/o outpt f/u to [facility]umatology  Subjective:  Patient sitting up in bed resting. Pt wants to go home.  Objective: Temp:  [98 F (36.7 C)-98.1 F (36.7 C)] 98.1 F (36.7 C) (08/22 2139) Pulse Rate:  [83-96] 96 (08/22 2139) Resp:  [16-20] 20 (08/22 2139) BP: (144-159)/(90-105) 159/105 (08/22 2139) SpO2:  [99 %-100 %] 100 % (08/22 2139) Physical Exam: General: NAD, pleasant Cardiovascular: Regular rate and rhythm, normal S1-S2, no murmurs appreciated Respiratory: Normal work of breathing, clear to auscultation bilaterally Gastrointestinal: Soft, nontender, nondistended, obese abdomen, normal bowel sounds Extremities: Moves upper extremities spontaneously. Full ROM of knees bilaterally. Improved, but still swollen. Not warm. No erythema. Mildly ttp. Joint stable.  Laboratory:  Recent Labs Lab 04/20/17 1600 04/21/17 0806 04/22/17 0357  WBC 24.2* 28.8* 32.9*  HGB 13.7 13.0 12.9  HCT 41.2 39.8 39.5  PLT 259 274 265    Recent Labs Lab 04/20/17 2201 04/21/17 0806 04/22/17 0357  NA 132* 135 135  K 3.9 4.3 4.2  CL 98* 100* 99*  CO2 '26 26 27  '$ BUN '7 11 14  '$ CREATININE 1.04* 0.91 0.93  CALCIUM 8.8*  9.5 9.7  GLUCOSE 99 168* 163*   ESR: 32 CRP: 4.9 CK Total: 46 AG: 9  Imaging/Diagnostic Tests: No results found.  Bonnita Hollow, MD 04/22/2017, 7:16 AM PGY-1, [place] Intern pager: [phone], text pages welcome

## 2017-04-22 NOTE — Discharge Instructions (Signed)
You were admitted to the hospital for knee pain likely from your connective tissue disease. You were given pain medications and IV steroids while you were here. You are now safe to go home.   Please follow up with the rheumatologist at [facility], you will need to call them for an appointment. We spoke to them on the phone and they said they would be happy to see you sometime within the next 5 days.   Additionally, we would like to follow up with you in the clinic in a week. We have scheduled an appt for you at the above time.

## 2017-04-22 NOTE — Discharge Summary (Signed)
[facility] Discharge Summary  Patient name: Linda [name] Medical record number: [id number] Date of birth: 09/15/1995 Age: 21 y.o. Gender: female Date of Admission: 04/20/2017  Date of Discharge: 04/22/17  Admitting Physician: Alveda Reasons, MD  Primary Care Provider: Tonette Bihari, MD Consultants: None  Indication for Hospitalization:   Linda [name] a 21 y.o.femalepresenting with right and left knee pain and swelling. PMH is significant for lupus (not on medications), Mixed Connective Tissue Disease (unmedicated), HTN, Obesity.  Discharge Diagnoses/Problem List:  BilateralKnee Pain/Swelling, R>L Mixed Connective Tissue Disease HTN Leukocytosis Hyperkalemia Metabolic acidosis AKI  Disposition: Home  Discharge Condition: Stable  Discharge Exam:  General: NAD, pleasant Cardiovascular: Regular rate and rhythm, normal S1-S2, no murmurs appreciated Respiratory: Normal work of breathing, clear to auscultation bilaterally Gastrointestinal: Soft, nontender, nondistended, obese abdomen, normal bowel sounds Extremities: Moves upper extremities spontaneously. Full ROM of knees bilaterally. Improved, but still swollen. Not warm. No erythema. Mildly ttp. Joint stable.  Brief Hospital Course:  Linda [name] is a 21 y.o. female who presented with bilateral knee pain and swelling. She was discharged from the hospital on 08/15 for right knee pain and swelling. At that time it was thought to be due to either an infection or because of autoimmune issues, she was given antibiotics and steroids. Upon discharge her knee pain and swelling had improved. Patient noted that since going home on 08/15, she felt worse in terms of her right knee pain and swelling. She noted that her left knee started hurting on 08/21 and was worrisome when it began to swell.   In the ED, patient had metabolic acidosis with and AG of 17 that resolved after admission,  unsure of etiology. Also with hyperkalemia with K of 5.7, given kayexalate 15 mg and it resolved.   Patient with leukocytosis as well, but given her use of steroids at home, her knee swelling was treated as mixed connective tissue disorder flare rather than infection. Patient required no antibiotics during her stay. Repeat MRI of both knees were obtained, results below. Patient given '80mg'$  methylprednisolone IV for 3 day duration for MCTD flare. Spoke with [facility] to try to transfer patient to tertiary care with rheumatology on day 2 of her stay, but was informed they would do nothing different, and she will be able to get an outpatient visit with [facility] Rheumatology within 5 days of discharge. Spoke with Rheumatologist on the phone who requested an aspiration to confirm no infection in the joint. However, no fluid was able to be aspirated despite good needle placement. Patient to follow up with [facility] as outpatient.   Uncontrolled hypertension during stay on Amlodipine 10 mg, but pain likely significantly contributing. Home HCTZ 25 mg was resumed due to resolved AKI from previous admission. Home Lisinopril was held and is still discontinued from previous hospitalization.    Issues for Follow Up:  1. Follow up w/ outpatient [facility]ology 2. Follow up with PCP for uncontrolled hypertension, and BMP to monitor Cr 3. Sent home with 40 mg of prednisone and 20 mg mobic for knee swelling and pain 4. Continue home Pepcid and sucralfate while taking steroids at home  Significant Procedures: Right Knee Aspiration  Significant Labs and Imaging:   Recent Labs Lab 04/20/17 1600 04/21/17 0806 04/22/17 0357  WBC 24.2* 28.8* 32.9*  HGB 13.7 13.0 12.9  HCT 41.2 39.8 39.5  PLT 259 274 265    Recent Labs Lab 04/20/17 1600 04/20/17 2201 04/21/17  9470 04/22/17 0357  NA 134* 132* 135 135  K 5.7* 3.9 4.3 4.2  CL 101 98* 100* 99*  CO2 16* '26 26 27  '$ GLUCOSE 73 99 168* 163*  BUN '11 7 11 14   '$ CREATININE 1.09* 1.04* 0.91 0.93  CALCIUM 9.1 8.8* 9.5 9.7  Neg Urine Pregnancy Test  CRP: 4.9 ESR: 32 CK: 46 TSH: 3.322 Anti-Jo antibody, IgG: <0.2 ds DNA Ab: 2 RA Latex Turbid: <10.0  ENA SM Ab Ser-aCnc: <0.2  Rheumatoid factor SerPl-aCnc: <10 Ribonucleic protein: 1.6 SSA (Ro) (ENA) Antibody, IgG: <0.2     SSB (La) (ENA) Antibody, IgG:  <0.2  Scleroderma (Scl-70) (ENA) Antibody: < 0.2      Mr Knee Right W Wo Contrast  Result Date: 04/21/2017 CLINICAL DATA:  Knee pain and swelling. EXAM: MRI OF THE RIGHT KNEE WITHOUT AND WITH CONTRAST TECHNIQUE: Multiplanar, multisequence MR imaging of the knee was performed. No intravenous contrast was administered. COMPARISON:  Radiographs dated 04/09/2017 FINDINGS: MENISCI Medial meniscus:  Normal. Lateral meniscus:  Normal. LIGAMENTS Cruciates:  Normal. Collaterals:  Normal. CARTILAGE Patellofemoral:  Normal. Medial:  Normal. Lateral:  Normal. Joint:  Normal. Popliteal Fossa:  Normal. Extensor Mechanism:  Normal. Bones:  Normal. Other: Extensive soft tissue calcifications in the subcutaneous fat primarily anterior to the knee with adjacent edema in the subcutaneous fat and superficial to the distal quadriceps muscles. IMPRESSION: 1. Normal appearing right knee joint. No internal derangement or joint effusion. 2. Calcification and edema in the subcutaneous fat primarily anterior to the knee consistent with dermatomyositis or lupus. Electronically Signed   By: Lorriane Shire M.D.   On: 04/21/2017 12:11   Mr Knee Left W Wo Contrast  Result Date: 04/21/2017 CLINICAL DATA:  Bilateral knee pain and swelling. EXAM: MRI OF THE LEFT KNEE WITHOUT AND WITH CONTRAST TECHNIQUE: Multiplanar, multisequence MR imaging of the knee was performed. No intravenous contrast was administered. COMPARISON:  Radiographs dated 12/28/2016 FINDINGS: The patient has extensive calcifications in the subcutaneous fat primarily anterior to the distal quadriceps tendon, patella, and  patellar tendon. This is probably a manifestation of dermatomyositis or lupus. There is edema in the adjacent subcutaneous fat. MENISCI Medial meniscus:  Normal. Lateral meniscus:  Normal. LIGAMENTS Cruciates:  Intact ACL and PCL. Collaterals: Medial collateral ligament is intact. Lateral collateral ligament complex is intact. CARTILAGE Patellofemoral:  Normal. Medial:  Normal. Lateral:  Normal. Joint:  Normal.  No joint effusion. Popliteal Fossa:  Normal. Extensor Mechanism:  Normal. Bones:  Normal. Other: None IMPRESSION: 1. Normal appearing left knee joint. No internal derangement or joint effusion. 2. Edema in the subcutaneous fat primarily anterior to the knee in areas of soft tissue calcification consistent with either dermatomyositis or lupus. This could represent sterile edema or cellulitis. Electronically Signed   By: Lorriane Shire M.D.   On: 04/21/2017 12:08   Results/Tests Pending at Time of Discharge:  Antinuclear Antibodies, IFA  Discharge Medications:  Allergies as of 04/22/2017      Reactions   Vicodin [hydrocodone-acetaminophen] Itching, Rash      Medication List    STOP taking these medications   ibuprofen 200 MG tablet Commonly known as:  ADVIL,MOTRIN     TAKE these medications   amLODipine 10 MG tablet Commonly known as:  NORVASC Take 1 tablet (10 mg total) by mouth daily.   famotidine 20 MG tablet Commonly known as:  PEPCID Take 1 tablet (20 mg total) by mouth daily as needed for heartburn or indigestion.   hydrochlorothiazide 25  MG tablet Commonly known as:  HYDRODIURIL Take 1 tablet (25 mg total) by mouth daily.   lidocaine 4 % cream Commonly known as:  LMX Apply topically 3 (three) times daily as needed (pain).   loperamide 2 MG capsule Commonly known as:  IMODIUM Take 1 capsule (2 mg total) by mouth as needed for diarrhea or loose stools.   meloxicam 15 MG tablet Commonly known as:  MOBIC Take 1 tablet (15 mg total) by mouth daily.   ondansetron 4 MG  tablet Commonly known as:  ZOFRAN Take 4 mg by mouth every 8 (eight) hours as needed for nausea or vomiting.   oxyCODONE-acetaminophen 5-325 MG tablet Commonly known as:  ROXICET Take 1 tablet by mouth every 6 (six) hours as needed for severe pain.   predniSONE 20 MG tablet Commonly known as:  DELTASONE Take 3 tablets (60 mg total) by mouth daily with breakfast. What changed:  how much to take            Discharge Care Instructions        Start     Ordered   04/23/17 0000  hydrochlorothiazide (HYDRODIURIL) 25 MG tablet  Daily     04/22/17 1159   04/23/17 0000  meloxicam (MOBIC) 15 MG tablet  Daily     04/22/17 1159   04/22/17 0000  predniSONE (DELTASONE) 20 MG tablet  Daily with breakfast     04/22/17 1159   04/22/17 0000  Increase activity slowly     04/22/17 1159   04/22/17 0000  Diet - low sodium heart healthy     04/22/17 1159   04/22/17 0000  oxyCODONE-acetaminophen (ROXICET) 5-325 MG tablet  Every 6 hours PRN     04/22/17 1349      Discharge Instructions: Please refer to Patient Instructions section of EMR for full details.  Patient was counseled important signs and symptoms that should prompt return to medical care, changes in medications, dietary instructions, activity restrictions, and follow up appointments.   Follow-Up Appointments: Follow-up [facility]. Schedule an appointment as soon as possible for a visit in 1 day(s).   Why:  Please call the number provided to schedule an out patient appointment.  Contact information: Clinic [phone] Fax [phone] Hours 8 am to 5pm, [facility] Locations: .[place] [address] .7th floor [facility] [place], Jeani Sow, MD Follow up on 05/04/2017.   Specialty:  Family Medicine Why:  Please arrive at 2:45PM for your hospital follow up visit Contact information: [place] [address] [phone]            Shirley, [place], DO 04/22/2017, 9:25 PM PGY-1, [place]

## 2017-04-22 NOTE — Progress Notes (Signed)
Nsg Discharge Note  Admit Date:  04/20/2017 Discharge date: 04/22/2017   Linda [name] to be D/C'd Home per MD order.  AVS completed.  Copy for chart, and copy for patient signed, and dated. Patient/caregiver able to verbalize understanding.  Discharge Medication: Allergies as of 04/22/2017      Reactions   Vicodin [hydrocodone-acetaminophen] Itching, Rash      Medication List    STOP taking these medications   ibuprofen 200 MG tablet Commonly known as:  ADVIL,MOTRIN     TAKE these medications   amLODipine 10 MG tablet Commonly known as:  NORVASC Take 1 tablet (10 mg total) by mouth daily.   famotidine 20 MG tablet Commonly known as:  PEPCID Take 1 tablet (20 mg total) by mouth daily as needed for heartburn or indigestion.   hydrochlorothiazide 25 MG tablet Commonly known as:  HYDRODIURIL Take 1 tablet (25 mg total) by mouth daily.   lidocaine 4 % cream Commonly known as:  LMX Apply topically 3 (three) times daily as needed (pain).   loperamide 2 MG capsule Commonly known as:  IMODIUM Take 1 capsule (2 mg total) by mouth as needed for diarrhea or loose stools.   meloxicam 15 MG tablet Commonly known as:  MOBIC Take 1 tablet (15 mg total) by mouth daily.   ondansetron 4 MG tablet Commonly known as:  ZOFRAN Take 4 mg by mouth every 8 (eight) hours as needed for nausea or vomiting.   oxyCODONE-acetaminophen 5-325 MG tablet Commonly known as:  ROXICET Take 1 tablet by mouth every 6 (six) hours as needed for severe pain.   predniSONE 20 MG tablet Commonly known as:  DELTASONE Take 3 tablets (60 mg total) by mouth daily with breakfast. What changed:  how much to take            Discharge Care Instructions        Start     Ordered   04/23/17 0000  hydrochlorothiazide (HYDRODIURIL) 25 MG tablet  Daily     04/22/17 1159   04/23/17 0000  meloxicam (MOBIC) 15 MG tablet  Daily     04/22/17 1159   04/22/17 0000  predniSONE (DELTASONE) 20 MG tablet  Daily  with breakfast     04/22/17 1159   04/22/17 0000  Increase activity slowly     04/22/17 1159   04/22/17 0000  Diet - low sodium heart healthy     04/22/17 1159   04/22/17 0000  oxyCODONE-acetaminophen (ROXICET) 5-325 MG tablet  Every 6 hours PRN     04/22/17 1349      Discharge Assessment: Vitals:   04/22/17 0814 04/22/17 1348  BP: (!) 151/95 (!) 154/110  Pulse:  91  Resp:  18  Temp:  98.5 F (36.9 C)  SpO2:  99%   Skin clean, dry and intact without evidence of skin break down, no evidence of skin tears noted. IV catheter discontinued intact. Site without signs and symptoms of complications - no redness or edema noted at insertion site, patient denies c/o pain - only slight tenderness at site.  Dressing with slight pressure applied.  D/c Instructions-Education:  Discharge delay d/t waiting for Dr. To change pain prn med d/t allergy.  Discharge instructions given to patient/family with verbalized understanding. D/c education completed with patient/family including follow up instructions, medication list, d/c activities limitations if indicated, with other d/c instructions as indicated by MD - patient able to verbalize understanding, all questions fully answered. Patient instructed to return to  ED, call 911, or call MD for any changes in condition.  Patient escorted via [place], and D/C home via private auto.  Gleason, Ginger Margaretha Sheffield, RN 04/22/2017 2:33 PM

## 2017-04-26 LAB — ANTINUCLEAR ANTIBODIES, IFA: ANA Ab, IFA: POSITIVE — AB

## 2017-04-26 LAB — FANA STAINING PATTERNS: Nucleolar Pattern: 1:320 {titer} — ABNORMAL HIGH

## 2017-05-04 ENCOUNTER — Encounter: Payer: Self-pay | Admitting: Internal Medicine

## 2017-05-04 ENCOUNTER — Ambulatory Visit (INDEPENDENT_AMBULATORY_CARE_PROVIDER_SITE_OTHER): Payer: Self-pay | Admitting: Internal Medicine

## 2017-05-04 VITALS — BP 132/92 | HR 107 | Temp 98.0°F | Ht 66.0 in | Wt 226.0 lb

## 2017-05-04 DIAGNOSIS — M545 Low back pain, unspecified: Secondary | ICD-10-CM

## 2017-05-04 DIAGNOSIS — M549 Dorsalgia, unspecified: Secondary | ICD-10-CM | POA: Insufficient documentation

## 2017-05-04 DIAGNOSIS — G8929 Other chronic pain: Secondary | ICD-10-CM

## 2017-05-04 DIAGNOSIS — M351 Other overlap syndromes: Secondary | ICD-10-CM

## 2017-05-04 DIAGNOSIS — M359 Systemic involvement of connective tissue, unspecified: Secondary | ICD-10-CM

## 2017-05-04 DIAGNOSIS — I10 Essential (primary) hypertension: Secondary | ICD-10-CM

## 2017-05-04 MED ORDER — PREDNISONE 5 MG PO TABS: 5.0000 mg | ORAL_TABLET | Freq: Every day | ORAL | 0 refills | Status: DC

## 2017-05-04 MED ORDER — LISINOPRIL 20 MG PO TABS: 20.0000 mg | ORAL_TABLET | Freq: Every day | ORAL | 3 refills | Status: DC

## 2017-05-04 NOTE — Patient Instructions (Addendum)
Please ask about the [facility] card up front. I want to take prednisone 10 mg starting next week. And then the following week take 5 mg prednisone. Make sure to continue Pepcid while on prednisone. I am going to start you on lisinopril which is a blood pressure medication you will need to follow up in 2 weeks to have your blood work checked for this medication. I have placed a referral to [facility] rheumatology

## 2017-05-04 NOTE — Progress Notes (Signed)
   [facility] Kerrin Mo, MD Phone: [phone]  Reason For Visit: Hospital Follow up   # Mixed connective Tissue Disease Recent hospitalization x 2 for joint swelling thought to be due to patient's autoimmune disorder. Hx of Mixed connective tissue disease, previously seen by [facility] pediatric rheumatology. Previously patient has been on planequil without significant control over flare ups followed by chloroquine. Over the past 2 years, difficulty with rheumatology follow up. Previous rheumatologist appointments missed for only provider accepting Medicaid in [place]. Then medicaid was lost, patient applying for orange card now. During this time, patient has been on prednisone many times for mixed connective tissue flares.  Most recently patient developed flare up which included her knee joints, bilaterally. Patient has now been on prednisone for approximately 2 weeks, 1 week in the hospital on 40 mg of prednisone,  1 week at home 20 mg of prednisone. Patient contacted [facility] and was told she needed a referral. Patient has not been taking Mobic. She has been taking Pepcid.   # Chronic HTN  Current Meds - Norvasc, HCTZ ,  Reports good compliance, took meds today. Tolerating well, w/o complaints. - mother is currently paying for all medications  Lifestyle - not currently very active, Denies CP, dyspnea, HA, edema, dizziness / lightheadedness  # Back Pain  - Has had lower back pain since hospital stay was thought to be due to bed  - No hx of trauma, no other red flags. States she the pain is intermittently in lower back, sometimes in lumbar area sometimes in thoracic area  - No red flags - other than prolonged prednisone use   Past Medical History Reviewed problem list.  Medications- reviewed and updated No additions to family history Social history- patient is a non- smoker  Objective: BP (!) 132/92   Pulse (!) 107   Temp 98 F (36.7 C) (Oral)    Ht 5\' 6"  (1.676 m)   Wt 226 lb (102.5 kg)   LMP 04/12/2017   SpO2 99%   BMI 36.48 kg/m  Gen: NAD, alert, cooperative with exam Cardio: regular rate and rhythm, S1S2 heard, no murmurs appreciated Pulm: clear to auscultation bilaterally, no wheezes, rhonchi or rales Extremities: warm, well perfused, No edema, cyanosis or clubbing;  MSK: Right leg with slight knee joint swelling present and continued tenderness especially on the lateral side, though much improved since hospitalization Back: No significant  Spinal tenderness, normal ROM,  Assessment/Plan: See problem based a/p  Back pain Likely musculoskeletal  - however given prolonged prednisone use will check xray for fractures  - DG Thoracic Spine 2 View; Future - DG Lumbar Spine Complete; Future   Connective tissue disease ([place]) - Sent in urgent referral to [facility] - several recent severe flare ups requiring hospital stay, difficulty with medications being obtained due to lack of insurance - applying for orange card, appealing medicaid denial - sent a letter stating patient has a chronic condition  -Currently taking 20 mg of prednisone - continue for the rest of week, then decrease to 10 mg for week, and finally 5 mg for prednisone for following week   Hypertension Continue Norvasc and HCTZ - would like blood pressure less than 130 /80  Will add lisinopril  Follow up in two weeks with BMET

## 2017-05-05 NOTE — Assessment & Plan Note (Signed)
-   Sent in urgent referral to [facility] - several recent severe flare ups requiring hospital stay, difficulty with medications being obtained due to lack of insurance - applying for orange card, appealing medicaid denial - sent a letter stating patient has a chronic condition  -Currently taking 20 mg of prednisone - continue for the rest of week, then decrease to 10 mg for week, and finally 5 mg for prednisone for following week

## 2017-05-05 NOTE — Assessment & Plan Note (Signed)
Continue Norvasc and HCTZ - would like blood pressure less than 130 /80  Will add lisinopril  Follow up in two weeks with BMET

## 2017-05-05 NOTE — Assessment & Plan Note (Signed)
Likely musculoskeletal  - however given prolonged prednisone use will check xray for fractures  - DG Thoracic Spine 2 View; Future - DG Lumbar Spine Complete; Future

## 2017-05-18 ENCOUNTER — Ambulatory Visit: Payer: Self-pay | Admitting: Internal Medicine

## 2017-05-23 ENCOUNTER — Other Ambulatory Visit: Payer: Self-pay | Admitting: Family Medicine

## 2017-05-26 ENCOUNTER — Ambulatory Visit: Payer: Self-pay | Admitting: Internal Medicine

## 2017-06-02 ENCOUNTER — Ambulatory Visit: Payer: Medicaid Other | Admitting: Internal Medicine

## 2017-06-08 ENCOUNTER — Encounter: Payer: Self-pay | Admitting: Internal Medicine

## 2017-06-08 ENCOUNTER — Ambulatory Visit (INDEPENDENT_AMBULATORY_CARE_PROVIDER_SITE_OTHER): Payer: Medicaid Other | Admitting: Internal Medicine

## 2017-06-08 VITALS — BP 130/96 | HR 88 | Temp 98.2°F | Ht 66.0 in | Wt 228.0 lb

## 2017-06-08 DIAGNOSIS — M25462 Effusion, left knee: Secondary | ICD-10-CM

## 2017-06-08 DIAGNOSIS — I10 Essential (primary) hypertension: Secondary | ICD-10-CM | POA: Diagnosis not present

## 2017-06-08 DIAGNOSIS — M25461 Effusion, right knee: Secondary | ICD-10-CM | POA: Diagnosis not present

## 2017-06-08 MED ORDER — TRAMADOL HCL 50 MG PO TABS: 50.0000 mg | ORAL_TABLET | Freq: Three times a day (TID) | ORAL | 0 refills | Status: DC | PRN

## 2017-06-08 NOTE — Patient Instructions (Addendum)
Rheumatology - [facility] [place], [place] [address]    Rheumatology - [phone]   Definitely restart your HCTZ for the better blood pressure control. If you need any refills have pharmacy call. For your knee we are going to give you some tramadol to help with the pain. You can also try ibuprofen and tylenol as needed.

## 2017-06-08 NOTE — Progress Notes (Signed)
   [facility] Kerrin Mo, MD Phone: [phone]  Reason For Visit: Follow up   # CHRONIC HTN: Reports none Current Meds - Lisinopril and  Norvasc; has not been taking HCTZ   Reports good compliance, took meds today. Tolerating well, w/o complaints. Lifestyle - Have been help her Aunt clean out the hous e Denies CP, dyspnea, HA, edema, dizziness / lightheadedness  # Right knee pain  - Mixed Connective Tissue Disease causing swelling in leg  - Patient has not followed up with rheumatology  - Patient continues to have pain in knee and foot at times  - Does not take anything for the pain - tried Mobic which did not work  - She takes ibuprofen as needed   Past Medical History Reviewed problem list.  Medications- reviewed and updated No additions to family history Social history- patient is a non-smoker  Objective: BP (!) 130/96   Pulse 88   Temp 98.2 F (36.8 C) (Oral)   Ht 5\' 6"  (1.676 m)   Wt 228 lb (103.4 kg)   LMP 06/04/2017   SpO2 97%   BMI 36.80 kg/m  Gen: NAD, alert, cooperative with exam Cardio: regular rate and rhythm, S1S2 heard, no murmurs appreciated Pulm: clear to auscultation bilaterally, no wheezes, rhonchi or rales GI: soft, non-tender, non-distended, bowel sounds present, no hepatomegaly, no splenomegaly MSK; right knee tender to palpation, no swelling surrounding, no erythema, patient with small hard nodules on knee  and surrounding legs similar mixed connective tissue nodules other areas of skin, no signs of infection   Assessment/Plan: See problem based a/p  Hypertension Continue lisinopril and Norvasc, restart Hydroclorthiazide  BMET today   Bilateral knee swelling Knee pain, no current swelling- ongoing mixed connective tissue disease likely causing most of patient's joint issues -Reiterated the importance of patient following up with rheumatology especially now that she has Medicaid - Will prescribe patient tramadol;  discussed that this cannot be a long-term; just until patient gets in to see rheumatology

## 2017-06-09 LAB — BASIC METABOLIC PANEL
BUN/Creatinine Ratio: 16 (ref 9–23)
BUN: 12 mg/dL (ref 6–20)
CO2: 18 mmol/L — ABNORMAL LOW (ref 20–29)
Calcium: 9.4 mg/dL (ref 8.7–10.2)
Chloride: 105 mmol/L (ref 96–106)
Creatinine, Ser: 0.75 mg/dL (ref 0.57–1.00)
GFR calc Af Amer: 132 mL/min/{1.73_m2} (ref >59)
GFR calc non Af Amer: 114 mL/min/{1.73_m2} (ref >59)
Glucose: 81 mg/dL (ref 65–99)
Potassium: 4.4 mmol/L (ref 3.5–5.2)
Sodium: 139 mmol/L (ref 134–144)

## 2017-06-10 NOTE — Assessment & Plan Note (Signed)
Continue lisinopril and Norvasc, restart Hydroclorthiazide  BMET today

## 2017-06-10 NOTE — Assessment & Plan Note (Signed)
Knee pain, no current swelling- ongoing mixed connective tissue disease likely causing most of patient's joint issues -Reiterated the importance of patient following up with rheumatology especially now that she has Medicaid - Will prescribe patient tramadol; discussed that this cannot be a long-term; just until patient gets in to see rheumatology

## 2017-06-13 ENCOUNTER — Encounter: Payer: Self-pay | Admitting: Internal Medicine

## 2017-07-16 ENCOUNTER — Other Ambulatory Visit: Payer: Self-pay | Admitting: Family Medicine

## 2017-07-16 ENCOUNTER — Other Ambulatory Visit: Payer: Self-pay | Admitting: Internal Medicine

## 2017-08-02 ENCOUNTER — Inpatient Hospital Stay (HOSPITAL_COMMUNITY)
Admission: EM | Admit: 2017-08-02 | Discharge: 2017-08-09 | DRG: 558 | Disposition: A | Discharge status: Home Health | Payer: Medicaid Other | Attending: Family Medicine | Admitting: Family Medicine

## 2017-08-02 ENCOUNTER — Emergency Department (HOSPITAL_COMMUNITY): Payer: Medicaid Other

## 2017-08-02 ENCOUNTER — Observation Stay (HOSPITAL_COMMUNITY): Payer: Medicaid Other

## 2017-08-02 ENCOUNTER — Other Ambulatory Visit: Payer: Self-pay

## 2017-08-02 ENCOUNTER — Encounter (HOSPITAL_COMMUNITY): Payer: Self-pay | Admitting: *Deleted

## 2017-08-02 DIAGNOSIS — Z885 Allergy status to narcotic agent status: Secondary | ICD-10-CM

## 2017-08-02 DIAGNOSIS — Z79899 Other long term (current) drug therapy: Secondary | ICD-10-CM

## 2017-08-02 DIAGNOSIS — I10 Essential (primary) hypertension: Secondary | ICD-10-CM | POA: Diagnosis present

## 2017-08-02 DIAGNOSIS — Z8249 Family history of ischemic heart disease and other diseases of the circulatory system: Secondary | ICD-10-CM

## 2017-08-02 DIAGNOSIS — Z6836 Body mass index (BMI) 36.0-36.9, adult: Secondary | ICD-10-CM

## 2017-08-02 DIAGNOSIS — F17210 Nicotine dependence, cigarettes, uncomplicated: Secondary | ICD-10-CM | POA: Diagnosis present

## 2017-08-02 DIAGNOSIS — M71161 Other infective bursitis, right knee: Principal | ICD-10-CM | POA: Diagnosis present

## 2017-08-02 DIAGNOSIS — M329 Systemic lupus erythematosus, unspecified: Secondary | ICD-10-CM

## 2017-08-02 DIAGNOSIS — Z7952 Long term (current) use of systemic steroids: Secondary | ICD-10-CM

## 2017-08-02 DIAGNOSIS — M25561 Pain in right knee: Secondary | ICD-10-CM

## 2017-08-02 DIAGNOSIS — Z832 Family history of diseases of the blood and blood-forming organs and certain disorders involving the immune mechanism: Secondary | ICD-10-CM

## 2017-08-02 DIAGNOSIS — G8929 Other chronic pain: Secondary | ICD-10-CM | POA: Diagnosis present

## 2017-08-02 DIAGNOSIS — M00061 Staphylococcal arthritis, right knee: Secondary | ICD-10-CM | POA: Diagnosis not present

## 2017-08-02 DIAGNOSIS — B9561 Methicillin susceptible Staphylococcus aureus infection as the cause of diseases classified elsewhere: Secondary | ICD-10-CM | POA: Diagnosis present

## 2017-08-02 DIAGNOSIS — L930 Discoid lupus erythematosus: Secondary | ICD-10-CM

## 2017-08-02 DIAGNOSIS — M25521 Pain in right elbow: Secondary | ICD-10-CM | POA: Diagnosis not present

## 2017-08-02 DIAGNOSIS — F17290 Nicotine dependence, other tobacco product, uncomplicated: Secondary | ICD-10-CM | POA: Diagnosis present

## 2017-08-02 DIAGNOSIS — M351 Other overlap syndromes: Secondary | ICD-10-CM | POA: Diagnosis present

## 2017-08-02 DIAGNOSIS — N898 Other specified noninflammatory disorders of vagina: Secondary | ICD-10-CM | POA: Diagnosis present

## 2017-08-02 DIAGNOSIS — E669 Obesity, unspecified: Secondary | ICD-10-CM | POA: Diagnosis present

## 2017-08-02 DIAGNOSIS — L03115 Cellulitis of right lower limb: Secondary | ICD-10-CM | POA: Diagnosis not present

## 2017-08-02 DIAGNOSIS — Z975 Presence of (intrauterine) contraceptive device: Secondary | ICD-10-CM

## 2017-08-02 DIAGNOSIS — M109 Gout, unspecified: Secondary | ICD-10-CM | POA: Diagnosis present

## 2017-08-02 DIAGNOSIS — M25531 Pain in right wrist: Secondary | ICD-10-CM

## 2017-08-02 DIAGNOSIS — M79621 Pain in right upper arm: Secondary | ICD-10-CM | POA: Diagnosis present

## 2017-08-02 DIAGNOSIS — Z791 Long term (current) use of non-steroidal anti-inflammatories (NSAID): Secondary | ICD-10-CM

## 2017-08-02 DIAGNOSIS — Z833 Family history of diabetes mellitus: Secondary | ICD-10-CM

## 2017-08-02 DIAGNOSIS — Z841 Family history of disorders of kidney and ureter: Secondary | ICD-10-CM

## 2017-08-02 DIAGNOSIS — Z79891 Long term (current) use of opiate analgesic: Secondary | ICD-10-CM

## 2017-08-02 DIAGNOSIS — Z9114 Patient's other noncompliance with medication regimen: Secondary | ICD-10-CM

## 2017-08-02 LAB — CBC WITH DIFFERENTIAL/PLATELET
Basophils Absolute: 0 10*3/uL (ref 0.0–0.1)
Basophils Relative: 0 %
Eosinophils Absolute: 0.2 10*3/uL (ref 0.0–0.7)
Eosinophils Relative: 1 %
HCT: 42.6 % (ref 36.0–46.0)
Hemoglobin: 13.7 g/dL (ref 12.0–15.0)
Lymphocytes Relative: 20 %
Lymphs Abs: 2.5 10*3/uL (ref 0.7–4.0)
MCH: 31.1 pg (ref 26.0–34.0)
MCHC: 32.2 g/dL (ref 30.0–36.0)
MCV: 96.8 fL (ref 78.0–100.0)
Monocytes Absolute: 1.4 10*3/uL — ABNORMAL HIGH (ref 0.1–1.0)
Monocytes Relative: 11 %
Neutro Abs: 8.5 10*3/uL — ABNORMAL HIGH (ref 1.7–7.7)
Neutrophils Relative %: 68 %
Platelets: 144 10*3/uL — ABNORMAL LOW (ref 150–400)
RBC: 4.4 MIL/uL (ref 3.87–5.11)
RDW: 14 % (ref 11.5–15.5)
WBC: 12.5 10*3/uL — ABNORMAL HIGH (ref 4.0–10.5)

## 2017-08-02 LAB — BASIC METABOLIC PANEL
Anion gap: 9 (ref 5–15)
BUN: 5 mg/dL — ABNORMAL LOW (ref 6–20)
CO2: 24 mmol/L (ref 22–32)
Calcium: 9.3 mg/dL (ref 8.9–10.3)
Chloride: 101 mmol/L (ref 101–111)
Creatinine, Ser: 0.88 mg/dL (ref 0.44–1.00)
GFR calc Af Amer: >60 mL/min (ref >60)
GFR calc non Af Amer: >60 mL/min (ref >60)
Glucose, Bld: 83 mg/dL (ref 65–99)
Potassium: 3.4 mmol/L — ABNORMAL LOW (ref 3.5–5.1)
Sodium: 134 mmol/L — ABNORMAL LOW (ref 135–145)

## 2017-08-02 MED ORDER — TRAMADOL HCL 50 MG PO TABS
50.0000 mg | ORAL_TABLET | Freq: Three times a day (TID) | ORAL | Status: DC | PRN
  Administered 2017-08-03: 50 mg via ORAL
  Filled 2017-08-02: qty 1

## 2017-08-02 MED ORDER — HYDROMORPHONE HCL 1 MG/ML IJ SOLN
1.0000 mg | Freq: Once | INTRAMUSCULAR | Status: AC
  Administered 2017-08-02: 1 mg via INTRAVENOUS
  Filled 2017-08-02: qty 1

## 2017-08-02 MED ORDER — POLYETHYLENE GLYCOL 3350 17 G PO PACK
17.0000 g | PACK | Freq: Every day | ORAL | Status: DC
  Administered 2017-08-02 – 2017-08-09 (×7): 17 g via ORAL
  Filled 2017-08-02 (×8): qty 1

## 2017-08-02 MED ORDER — SODIUM CHLORIDE 0.9 % IV SOLN
INTRAVENOUS | Status: DC
  Administered 2017-08-02 – 2017-08-06 (×7): via INTRAVENOUS

## 2017-08-02 MED ORDER — HYDROMORPHONE HCL 1 MG/ML IJ SOLN
1.0000 mg | INTRAMUSCULAR | Status: DC | PRN
  Administered 2017-08-02 – 2017-08-03 (×4): 1 mg via INTRAVENOUS
  Filled 2017-08-02 (×4): qty 1

## 2017-08-02 MED ORDER — ENOXAPARIN SODIUM 40 MG/0.4ML ~~LOC~~ SOLN
40.0000 mg | SUBCUTANEOUS | Status: DC
  Administered 2017-08-03 – 2017-08-09 (×7): 40 mg via SUBCUTANEOUS
  Filled 2017-08-02 (×7): qty 0.4

## 2017-08-02 MED ORDER — HYDROCHLOROTHIAZIDE 25 MG PO TABS
25.0000 mg | ORAL_TABLET | Freq: Every day | ORAL | Status: DC
  Administered 2017-08-02 – 2017-08-04 (×3): 25 mg via ORAL
  Filled 2017-08-02 (×3): qty 1

## 2017-08-02 MED ORDER — LISINOPRIL 20 MG PO TABS
20.0000 mg | ORAL_TABLET | Freq: Every day | ORAL | Status: DC
  Administered 2017-08-02 – 2017-08-03 (×2): 20 mg via ORAL
  Filled 2017-08-02 (×2): qty 1

## 2017-08-02 MED ORDER — GADOBENATE DIMEGLUMINE 529 MG/ML IV SOLN
20.0000 mL | Freq: Once | INTRAVENOUS | Status: AC
  Administered 2017-08-02: 20 mL via INTRAVENOUS

## 2017-08-02 MED ORDER — AMLODIPINE BESYLATE 10 MG PO TABS
10.0000 mg | ORAL_TABLET | Freq: Every day | ORAL | Status: DC
  Administered 2017-08-03 – 2017-08-09 (×7): 10 mg via ORAL
  Filled 2017-08-02 (×7): qty 1

## 2017-08-02 MED ORDER — DIPHENHYDRAMINE HCL 25 MG PO CAPS
25.0000 mg | ORAL_CAPSULE | Freq: Once | ORAL | Status: AC
  Administered 2017-08-02: 25 mg via ORAL
  Filled 2017-08-02: qty 1

## 2017-08-02 NOTE — Progress Notes (Signed)
Family Medicine Teaching Service Daily Progress Note Intern Pager: [phone]  Patient name: Linda [name] Medical record number: [id number] Date of birth: 01/13/1996 Age: 21 y.o. Gender: female  Primary Care Provider: Tonette Bihari, MD Consultants: none Code Status: Full  Pt Overview and Major Events to Date:  12/03: Admitted for R knee swelling and found to have cellulitis  Assessment and Plan: Linda [name] is a 21 y.o. female presenting with right knee pain and swelling. PMH is significant for lupus, connective tissue disease, HTN, obesity.  Cellulitis of R knee with BL knee swelling: Afebrile.CBC with leukocytosis to 12.5> 16.5 ( no prednisone use since September). Xray: stable.  MRI: No internal derangement. Cellulitis along the anterior aspect of R knee. No drainable fluid collection. Small amount of fluid in the deep infrapatellar bursa consistent with bursitis with mild adjacent soft tissue edema in Hoffa's fat. No significant joint effusion to suggest septic arthritis. -Scheduled Toradol 10 mg 3 times a day -Dilaudid q4hr PRN for severe pain- discontinued --will start doxycycline for cellulitis  --Blood cultures pending -Ortho consulted for joint aspiration per rheumatology request from previous admisssion --ESR 21, CRP 12.7  R Upper Arm pain and swelling: Pain and swelling started with knee pain. No discharge noted, no erythema; fluctuant on exam.  -- MRI of R upper arm: No MRI evidence of cellulitis. No drainable fluid collection to suggest an abscess.  HTN: elevated BP likely due to poorly controlled HTN from non-compliance with medications, but pain is also likely contributing. Improved after receiving medications.   - Scheduled Toradol  - Restart home amlodipine 10 mg daily, HCTZ 25 mg daily, lisinopril 20 mg daily  Leukocytosis:WBC 12.5> 16.5. Patient not taking prednisone at home at this point.  - Daily CBC  History of connective tissue disease  and lupus: Likely contributing to knee pain and swelling - ESR 21and CRP 12.7 - Patient has f/u with rheumatology at [facility] in February, previously missed appointment- now that she has medicaid could try to get her seen by local rheumatologist   FEN/GI: Regular diet Prophylaxis: Lovenox  Disposition: Home  Subjective:  Patient and mom in room complaining about how medication is being delivered. She is refusing the tramadol most times because she reports it doesn't work for her. Encouraged to take a pain medication as a baseline and then have the dilaudid for breakthrough only.   Objective: Temp:  [98.5 F (36.9 C)-98.7 F (37.1 C)] 98.7 F (37.1 C) (12/04 0456) Pulse Rate:  [96-113] 103 (12/04 0456) Resp:  [20-25] 20 (12/04 0456) BP: (124-173)/(88-133) 124/88 (12/04 0456) SpO2:  [98 %-100 %] 100 % (12/04 0456) Weight:  [220 lb (99.8 kg)-226 lb 6.6 oz (102.7 kg)] 226 lb 6.6 oz (102.7 kg) (12/03 2258) Physical Exam: General: NAD, laying in bed Neck: Supple, no LAD Cardiovascular: RRR, no m/r/g, no LE edema Respiratory: CTA BL, normal work of breathing MSK: Moves upper extremities spontaneously, limited ROM of R elbow. Fluctuance of R elbow. Unable to flex right knee due to pain. Able to flex left knee with some pain. Bilateral knees swollen and warm with erythema of R knee, purulent drainage noted over R knee. Difficult to identify landmarks of right patella secondary to swelling. No fluctuance appreciated. Derm:Diffuse circumferential areas of hyperpigmentation and crusting involving all surfaces of the skin Psych: AOx3, appropriate affect  Laboratory: Recent Labs  Lab 08/02/17 1325 08/03/17 0428  WBC 12.5* 16.5*  HGB 13.7 13.6  HCT 42.6 40.7  PLT 144* 128*  Recent Labs  Lab 08/02/17 1325  NA 134*  K 3.4*  CL 101  CO2 24  BUN 5*  CREATININE 0.88  CALCIUM 9.3  GLUCOSE 83   CRP 12.7 ESR 21  Imaging/Diagnostic Tests: Dg Knee 2 Views Right  Result Date:  08/02/2017 CLINICAL DATA:  Right knee swelling and drainage. History of mixed connective tissue disease. EXAM: RIGHT KNEE - 1-2 VIEW COMPARISON:  Radiographs 04/09/2017.  MRI 04/21/2017. FINDINGS: The mineralization and alignment are normal. There is no evidence of acute fracture or dislocation. The joint spaces are maintained. Extensive soft tissue calcifications are again noted anteriorly, similar to previous studies. No significant joint effusion identified. IMPRESSION: Stable radiographic appearance of the right knee with extensive soft tissue calcifications consistent with history of mixed connective tissue disease. No acute osseous findings. Electronically Signed   By: Richardean Sale M.D.   On: 08/02/2017 14:54   Mr Humerus Right W Wo Contrast  Result Date: 08/02/2017 CLINICAL DATA:  Having significant pain to the right upper arm with swelling. She has pain superficially to the skin. EXAM: MRI OF THE RIGHT HUMERUS WITHOUT AND WITH CONTRAST TECHNIQUE: Multiplanar, multisequence MR imaging of the right humerus was performed before and after the administration of intravenous contrast. CONTRAST:  16m MULTIHANCE GADOBENATE DIMEGLUMINE 529 MG/ML IV SOLN COMPARISON:  None. FINDINGS: Bones/Joint/Cartilage No fracture or dislocation. Normal alignment. No joint effusion. No periosteal reaction or bone destruction. No aggressive osseous lesion. Ligaments Ligaments are suboptimally evaluated by CT. Muscles and Tendons Mild edema and enhancement in the proximal short head of the biceps muscle at the muscular tendinous junction which may reflect muscle strain versus mild myositis secondary to an infectious or inflammatory etiology. Muscles are otherwise normal in signal. No intramuscular fluid collection or hematoma. Supraspinatus tendon is intact. Infraspinatus tendon is intact. Teres minor tendon is intact. Subscapularis tendon is intact. Soft tissue No soft tissue edema within the subcutaneous fat. No fluid  collection or hematoma. No soft tissue mass. IMPRESSION: 1. Mild edema and enhancement in the proximal short head of the biceps muscle at the muscular tendinous junction which may reflect muscle strain versus mild myositis secondary to an infectious or inflammatory etiology. 2. No MRI evidence of cellulitis. No drainable fluid collection to suggest an abscess. Electronically Signed   By: HKathreen Devoid  On: 08/02/2017 21:57   Mr Knee Right W Wo Contrast  Result Date: 08/02/2017 CLINICAL DATA:  21yo F with Lupus/mixed connective tissue disease with a chief complaint of recurrent cellulitis. This has been going on for the past few days. Having significant pain to the right knee with swelling. She has pain superficially to the skin. EXAM: MRI OF THE RIGHT KNEE WITHOUT AND WITH CONTRAST TECHNIQUE: Multiplanar, multisequence MR imaging of the right knee was performed both before and after administration of intravenous contrast. CONTRAST:  232mMULTIHANCE GADOBENATE DIMEGLUMINE 529 MG/ML IV SOLN COMPARISON:  None. FINDINGS: MENISCI Medial meniscus:  Intact. Lateral meniscus:  Intact. LIGAMENTS Cruciates:  Intact ACL and PCL. Collaterals: Medial collateral ligament is intact. Lateral collateral ligament complex is intact. CARTILAGE Patellofemoral:  No chondral defect. Medial:  No chondral defect. Lateral:  No chondral defect. Joint: No joint effusion. Mild edema in Hoffa's fat. No plical thickening. Small amount of fluid in the deep infrapatellar bursa. Popliteal Fossa:  No Baker cyst.  Intact popliteus tendon. Extensor Mechanism: Intact quadriceps tendon and patellar tendon. Intact medial and lateral patellar retinaculum. Intact MPFL. Bones: No marrow signal abnormality. No fracture or  dislocation. No aggressive osseous lesion. No periosteal reaction or bone destruction. Soft tissue: Dystrophic calcification within the subcutaneous fat anterior to the distal quadriceps tendon, patella and patellar tendon consistent  with the patient's history of connective tissue disease. Soft tissue edema in the subcutaneous fat along the anterior aspect of the knee with enhancement on postcontrast imaging most concerning for cellulitis. No focal fluid collection to suggest an abscess. IMPRESSION: 1. No internal derangement of the right knee. 2. Cellulitis along the anterior aspect of the knee. No drainable fluid collection to suggest a hematoma. 3. Small amount of fluid in the deep infrapatellar bursa consistent with bursitis with mild adjacent soft tissue edema in Hoffa's fat. 4. No significant joint effusion to suggest septic arthritis. Electronically Signed   By: Kathreen Devoid   On: 08/02/2017 22:02    Shirley, [place], DO 08/03/2017, 6:28 AM PGY-1, [place] Intern pager: [phone], text pages welcome

## 2017-08-02 NOTE — H&P (Signed)
[facility] Admission History and Physical Service Pager: [phone]  Patient name: Linda [name] Medical record number: [id number] Date of birth: [birth year] Age: 21 y.o. Gender: female  Primary Care Provider: Tonette Bihari, MD Consultants: none Code Status: Full  Chief Complaint: R knee pain swelling and R arm pain and swelling  Assessment and Plan: Linda [name] is a 21 y.o. female presenting with right knee pain and swelling. PMH is significant for lupus, connective tissue disease, HTN, obesity.  Bilateral knee pain/swelling, R >> L: Recently discharged on August 21 with right knee pain and swelling thought to be due to either infectious vs autoimmune etiology. R knee pain and swelling started worsening on 11/28 with white, chalky discharge per patient from R knee. Afebrile. Hypertensive on admission to 170/133, pulse 113, normal respiratory rate, O2 100% on RA. Exam showing tender, swollen, erythematous and warm R knee with swelling and tenderness also noted on the L. Difficult to see patellar landmarks on right knee and exam limited by patient pain tolerance. BMP showing potassium 3.4, creatinine 0.88. CBC showing leukocytosis to 12.5 (pateint reports no prednisone use since end of September). Suspect that knee pain and swelling likely autoimmune with history of connective tissue disease versus infectious etiology at this time. No hx of trauma. No signs of rhabdo. Xray: stable radiographic appearance of the right knee with extensive soft tissue calcifications consistent with history of mixed connective tissue disease. No acute osseous findings  -Admit to family medicine teaching service, MedSurg, attending Dr. Mingo Amber -Vitals per floor protocol -Scheduled Tramadol 50 mg 3 times a day -Dilaudid q4 hr PRN for severe pain --hold antibiotics for now --obtain blood cultures -My need ortho consult for joint aspiration -MRI of R knee --ESR, CRP  pending  R Upper Arm pain and swelling: Pain and swelling started with knee pain. No discharge noted, no erythema; fluctuant on exam.  -- MRI of R upper arm  HTN: elevated BP likely due to poorly controlled HTN, but pain is also likely contributing. Suspect it will improve with pain control.  - Dilaudid as above - Restart home amlodipine 10 mg daily, HCTZ 25 mg daily, lisinopril 20 mg daily  Leukocytosis: WBC 12.5. Patient not taking prednisone at home at this point.  - Daily CBC  History of connective tissue disease and lupus: Likely contributing to knee pain and swelling - ESR and CRP pending - Patient has f/u with rheumatology at [facility] in February, previously missed appointment  FEN/GI: Regular diet Prophylaxis: Lovenox  Disposition: Admit to med-surg  History of Present Illness:  Linda [name] is a 21 y.o. female presenting with R knee pain, swelling, and redness since 11/28. Patient notes that she noticed white, chalky discharge without an odor starting on 11/30.  Reports that she has had this before.  Patient reports that she noted that the swelling increased in bilateral lower extremities after her prednisone was stopped at the end of September.  She reports that it just became worse since November 28.  She thinks that she has had a history of gout before but this is different.  Denies any trauma.    She is unable to bend her right knee due to pain and exam is limited due to pain.  She is able to bend her left knee.  Patient denies any fevers at home.  She reports that she has a good appetite and is drinking normally.  She reports that she has been feeling nauseous but denies  any episodes of emesis and reports that she has been constipated for the past few weeks.  Her last bowel movement was yesterday but reports that it was hard.  Patient was to follow-up with dermatology after her last admission in August, but she missed this appointment reportedly due to her mom telling her the  wrong date.  Patient now has an appointment in February with [facility] rheumatology.  Review Of Systems: Per HPI with the following additions:   Review of Systems  Constitutional: Negative for chills and fever.  Eyes: Negative for blurred vision and double vision.  Respiratory: Negative for cough, shortness of breath and wheezing.   Cardiovascular: Positive for leg swelling. Negative for chest pain and palpitations.  Gastrointestinal: Positive for constipation and nausea. Negative for abdominal pain, diarrhea and vomiting.  Genitourinary: Negative for dysuria.  Musculoskeletal: Positive for joint pain. Negative for myalgias.  Skin: Negative for rash.  Neurological: Positive for headaches. Negative for weakness.  Psychiatric/Behavioral: Negative for substance abuse.    Patient Active Problem List   Diagnosis Date Noted  . Back pain 05/04/2017  . Bilateral knee swelling 04/20/2017  . History of connective tissue disease   . Leg swelling   . Abdominal pain   . Connective tissue disease ([place])   . Cellulitis 04/09/2017  . Hypertension 12/11/2016  . Mixed connective tissue disease ([place]) 02/11/2016  . High BMI 01/19/2014  . Implanon in place 01/19/2014    Past Medical History: Past Medical History:  Diagnosis Date  . Lupus   . Migraines   . Mixed connective tissue disease ([place])     Past Surgical History: Past Surgical History:  Procedure Laterality Date  . MULTIPLE TOOTH EXTRACTIONS      Social History: Social History   Tobacco Use  . Smoking status: Current Every Day Smoker    Packs/day: 0.00    Types: Cigarettes, Cigars    Start date: 02/11/2015  . Smokeless tobacco: Never Used  Substance Use Topics  . Alcohol use: No    Alcohol/week: 0.0 oz    Comment: social  . Drug use: No   Please also refer to relevant sections of EMR.  Family History: Family History  Problem Relation Age of Onset  . Lupus Mother   . Hypertension Mother   . Kidney disease Mother    . Diabetes Maternal Grandmother     Allergies and Medications: Allergies  Allergen Reactions  . Vicodin [Hydrocodone-Acetaminophen] Itching and Rash   Current Facility-Administered Medications on File Prior to Encounter  Medication Dose Route Frequency Provider Last Rate Last Dose  . etonogestrel (IMPLANON) implant 68 mg  68 mg Subcutaneous Once Shelly Bombard, MD       Current Outpatient Medications on File Prior to Encounter  Medication Sig Dispense Refill  . amLODipine (NORVASC) 10 MG tablet TAKE 1 TABLET BY MOUTH DAILY 30 tablet 0  . famotidine (PEPCID) 20 MG tablet Take 1 tablet (20 mg total) by mouth daily as needed for heartburn or indigestion. 60 tablet 0  . hydrochlorothiazide (HYDRODIURIL) 25 MG tablet TAKE 1 TABLET BY MOUTH EVERY DAY 30 tablet 0  . lidocaine (LMX) 4 % cream Apply topically 3 (three) times daily as needed (pain). (Patient not taking: Reported on 04/20/2017) 30 g 0  . lisinopril (PRINIVIL,ZESTRIL) 20 MG tablet Take 1 tablet (20 mg total) by mouth daily. 90 tablet 3  . loperamide (IMODIUM) 2 MG capsule Take 1 capsule (2 mg total) by mouth as needed for diarrhea or loose  stools. 15 capsule 0  . meloxicam (MOBIC) 15 MG tablet Take 1 tablet (15 mg total) by mouth daily. 5 tablet 0  . ondansetron (ZOFRAN) 4 MG tablet Take 4 mg by mouth every 8 (eight) hours as needed for nausea or vomiting.    . predniSONE (DELTASONE) 5 MG tablet Take 1 tablet (5 mg total) by mouth daily with breakfast. 7 tablet 0  . traMADol (ULTRAM) 50 MG tablet Take 1 tablet (50 mg total) by mouth every 8 (eight) hours as needed. 30 tablet 0    Objective: BP (!) 170/133 (BP Location: Left Arm)   Pulse 97   Temp 98.5 F (36.9 C) (Oral)   Resp 20   LMP 07/31/2017 (Exact Date)   SpO2 100%  Exam: General: mild distress, pleasant Eyes: PERRL, EOMI, no conjunctival pallor or injection ENTM: Moist mucous membranes, no pharyngeal erythema or exudate Neck: Supple, no LAD Cardiovascular:  tachycardic, regular rhythm, no m/r/g, no LE edema Respiratory: CTA BL, normal work of breathing Gastrointestinal: soft, nontender, nondistended, normoactive BS MSK: Moves upper extremities spontaneously, limited ROM of R elbow. Fluctuance of R elbow. Unable to flex right knee due to pain. Able to flex left knee with some pain. Bilateral knees swollen and warm with erythema of R knee, area of purulence noted over R knee. Difficult to identify landmarks of right patella secondary to swelling. No fluctuance appreciated. Derm: Diffuse circumferential areas of hypopigmentation and crusting involving all surfaces of the skin  Neuro: CN II-XII grossly intact Psych: normal mood, appropriate affect  Labs and Imaging: CBC BMET  Recent Labs  Lab 08/02/17 1325  WBC 12.5*  HGB 13.7  HCT 42.6  PLT 144*   Recent Labs  Lab 08/02/17 1325  NA 134*  K 3.4*  CL 101  CO2 24  BUN 5*  CREATININE 0.88  GLUCOSE 83  CALCIUM 9.3     Shirley, [place], DO 08/02/2017, 2:57 PM PGY-1, [place] Intern pager: [phone], text pages welcome   FPTS Upper-Level Resident Addendum  I have independently interviewed and examined the patient. I have discussed the above with the original author and agree with their documentation. My edits for correction/addition/clarification are in pink.Please see also any attending notes.   Lucila Maine, DO PGY-2, [place] Service pager: [phone] (text pages welcome through [facility])
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
## 2017-08-02 NOTE — ED Notes (Signed)
Pt in MRI received report from [place], [place] for pt to come to [facility] when she returns.  RN states he will collect blood and new set of vitals prior to transport.
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
## 2017-08-02 NOTE — ED Notes (Signed)
Report called and given to [place] Jansky, Therapist, sports. Pt to be transported once pt comes from Xray

## 2017-08-02 NOTE — ED Notes (Signed)
Pt to MRI

## 2017-08-02 NOTE — ED Notes (Signed)
Dr. Enid Derry (FM) paged to [phone] RN

## 2017-08-02 NOTE — ED Notes (Signed)
Pt's mother's cell phone  [phone]

## 2017-08-03 DIAGNOSIS — F17290 Nicotine dependence, other tobacco product, uncomplicated: Secondary | ICD-10-CM | POA: Diagnosis not present

## 2017-08-03 DIAGNOSIS — F17210 Nicotine dependence, cigarettes, uncomplicated: Secondary | ICD-10-CM | POA: Diagnosis not present

## 2017-08-03 DIAGNOSIS — L03115 Cellulitis of right lower limb: Secondary | ICD-10-CM

## 2017-08-03 DIAGNOSIS — N898 Other specified noninflammatory disorders of vagina: Secondary | ICD-10-CM | POA: Diagnosis present

## 2017-08-03 DIAGNOSIS — B9561 Methicillin susceptible Staphylococcus aureus infection as the cause of diseases classified elsewhere: Secondary | ICD-10-CM | POA: Diagnosis not present

## 2017-08-03 DIAGNOSIS — Z7952 Long term (current) use of systemic steroids: Secondary | ICD-10-CM | POA: Diagnosis not present

## 2017-08-03 DIAGNOSIS — Z841 Family history of disorders of kidney and ureter: Secondary | ICD-10-CM | POA: Diagnosis not present

## 2017-08-03 DIAGNOSIS — Z79891 Long term (current) use of opiate analgesic: Secondary | ICD-10-CM | POA: Diagnosis not present

## 2017-08-03 DIAGNOSIS — Z975 Presence of (intrauterine) contraceptive device: Secondary | ICD-10-CM | POA: Diagnosis not present

## 2017-08-03 DIAGNOSIS — I10 Essential (primary) hypertension: Secondary | ICD-10-CM | POA: Diagnosis not present

## 2017-08-03 DIAGNOSIS — Z885 Allergy status to narcotic agent status: Secondary | ICD-10-CM | POA: Diagnosis not present

## 2017-08-03 DIAGNOSIS — M25531 Pain in right wrist: Secondary | ICD-10-CM | POA: Diagnosis not present

## 2017-08-03 DIAGNOSIS — Z79899 Other long term (current) drug therapy: Secondary | ICD-10-CM | POA: Diagnosis not present

## 2017-08-03 DIAGNOSIS — Z833 Family history of diabetes mellitus: Secondary | ICD-10-CM | POA: Diagnosis not present

## 2017-08-03 DIAGNOSIS — Z9114 Patient's other noncompliance with medication regimen: Secondary | ICD-10-CM | POA: Diagnosis not present

## 2017-08-03 DIAGNOSIS — Z6836 Body mass index (BMI) 36.0-36.9, adult: Secondary | ICD-10-CM | POA: Diagnosis not present

## 2017-08-03 DIAGNOSIS — M25561 Pain in right knee: Secondary | ICD-10-CM | POA: Diagnosis not present

## 2017-08-03 DIAGNOSIS — Z8249 Family history of ischemic heart disease and other diseases of the circulatory system: Secondary | ICD-10-CM | POA: Diagnosis not present

## 2017-08-03 DIAGNOSIS — Z791 Long term (current) use of non-steroidal anti-inflammatories (NSAID): Secondary | ICD-10-CM | POA: Diagnosis not present

## 2017-08-03 DIAGNOSIS — E669 Obesity, unspecified: Secondary | ICD-10-CM | POA: Diagnosis not present

## 2017-08-03 DIAGNOSIS — M109 Gout, unspecified: Secondary | ICD-10-CM | POA: Diagnosis not present

## 2017-08-03 DIAGNOSIS — M71161 Other infective bursitis, right knee: Secondary | ICD-10-CM | POA: Diagnosis not present

## 2017-08-03 DIAGNOSIS — M00061 Staphylococcal arthritis, right knee: Secondary | ICD-10-CM | POA: Diagnosis not present

## 2017-08-03 DIAGNOSIS — G8929 Other chronic pain: Secondary | ICD-10-CM | POA: Diagnosis not present

## 2017-08-03 DIAGNOSIS — M79621 Pain in right upper arm: Secondary | ICD-10-CM | POA: Diagnosis not present

## 2017-08-03 DIAGNOSIS — M351 Other overlap syndromes: Secondary | ICD-10-CM | POA: Diagnosis not present

## 2017-08-03 DIAGNOSIS — L930 Discoid lupus erythematosus: Secondary | ICD-10-CM | POA: Diagnosis not present

## 2017-08-03 DIAGNOSIS — Z832 Family history of diseases of the blood and blood-forming organs and certain disorders involving the immune mechanism: Secondary | ICD-10-CM | POA: Diagnosis not present

## 2017-08-03 LAB — C-REACTIVE PROTEIN: CRP: 12.7 mg/dL — ABNORMAL HIGH (ref <1.0)

## 2017-08-03 LAB — BASIC METABOLIC PANEL
Anion gap: 10 (ref 5–15)
BUN: 5 mg/dL — ABNORMAL LOW (ref 6–20)
CO2: 23 mmol/L (ref 22–32)
Calcium: 8.7 mg/dL — ABNORMAL LOW (ref 8.9–10.3)
Chloride: 98 mmol/L — ABNORMAL LOW (ref 101–111)
Creatinine, Ser: 0.79 mg/dL (ref 0.44–1.00)
GFR calc Af Amer: >60 mL/min (ref >60)
GFR calc non Af Amer: >60 mL/min (ref >60)
Glucose, Bld: 93 mg/dL (ref 65–99)
Potassium: 3.6 mmol/L (ref 3.5–5.1)
Sodium: 131 mmol/L — ABNORMAL LOW (ref 135–145)

## 2017-08-03 LAB — CBC
HCT: 40.7 % (ref 36.0–46.0)
Hemoglobin: 13.6 g/dL (ref 12.0–15.0)
MCH: 31.8 pg (ref 26.0–34.0)
MCHC: 33.4 g/dL (ref 30.0–36.0)
MCV: 95.1 fL (ref 78.0–100.0)
Platelets: 128 10*3/uL — ABNORMAL LOW (ref 150–400)
RBC: 4.28 MIL/uL (ref 3.87–5.11)
RDW: 13.9 % (ref 11.5–15.5)
WBC: 16.5 10*3/uL — ABNORMAL HIGH (ref 4.0–10.5)

## 2017-08-03 LAB — SEDIMENTATION RATE: Sed Rate: 21 mm/hr (ref 0–22)

## 2017-08-03 MED ORDER — LIDOCAINE HCL 1 % IJ SOLN
5.0000 mL | Freq: Once | INTRAMUSCULAR | Status: DC
  Filled 2017-08-03: qty 5

## 2017-08-03 MED ORDER — OXYCODONE HCL 5 MG PO TABS
5.0000 mg | ORAL_TABLET | ORAL | Status: DC | PRN
  Administered 2017-08-03: 5 mg via ORAL
  Filled 2017-08-03 (×2): qty 1

## 2017-08-03 MED ORDER — METHYLPREDNISOLONE ACETATE 40 MG/ML IJ SUSP
40.0000 mg | Freq: Once | INTRAMUSCULAR | Status: DC
  Filled 2017-08-03: qty 1

## 2017-08-03 MED ORDER — BUPIVACAINE HCL (PF) 0.5 % IJ SOLN
10.0000 mL | Freq: Once | INTRAMUSCULAR | Status: DC
  Filled 2017-08-03: qty 10

## 2017-08-03 MED ORDER — HYDROMORPHONE HCL 1 MG/ML IJ SOLN
1.0000 mg | Freq: Once | INTRAMUSCULAR | Status: AC
  Administered 2017-08-03: 1 mg via INTRAVENOUS
  Filled 2017-08-03: qty 1

## 2017-08-03 MED ORDER — VANCOMYCIN HCL IN DEXTROSE 1-5 GM/200ML-% IV SOLN
1000.0000 mg | Freq: Three times a day (TID) | INTRAVENOUS | Status: DC
  Administered 2017-08-03 – 2017-08-07 (×12): 1000 mg via INTRAVENOUS
  Filled 2017-08-03 (×13): qty 200

## 2017-08-03 MED ORDER — OXYCODONE HCL 5 MG PO TABS
5.0000 mg | ORAL_TABLET | ORAL | Status: DC | PRN
  Administered 2017-08-03 – 2017-08-05 (×11): 10 mg via ORAL
  Administered 2017-08-06: 5 mg via ORAL
  Administered 2017-08-07 – 2017-08-09 (×11): 10 mg via ORAL
  Filled 2017-08-03 (×9): qty 2
  Filled 2017-08-03: qty 1
  Filled 2017-08-03 (×7): qty 2
  Filled 2017-08-03: qty 1
  Filled 2017-08-03 (×8): qty 2

## 2017-08-03 MED ORDER — KETOROLAC TROMETHAMINE 10 MG PO TABS
10.0000 mg | ORAL_TABLET | Freq: Three times a day (TID) | ORAL | Status: DC
  Administered 2017-08-03 (×2): 10 mg via ORAL
  Filled 2017-08-03 (×4): qty 1

## 2017-08-03 MED ORDER — DOXYCYCLINE HYCLATE 100 MG PO TABS
100.0000 mg | ORAL_TABLET | Freq: Two times a day (BID) | ORAL | Status: DC
  Administered 2017-08-03: 100 mg via ORAL
  Filled 2017-08-03: qty 1
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
## 2017-08-03 NOTE — Progress Notes (Signed)
Orthopedic Tech Progress Note Patient Details:  Linda [name] 22-May-1996 [id number]  Ortho Devices Type of Ortho Device: Knee Immobilizer Ortho Device/Splint Location: rle Ortho Device/Splint Interventions: Application   Post Interventions Patient Tolerated: Well Instructions Provided: Care of device   Hildred Priest 08/03/2017, 2:05 PM
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
## 2017-08-03 NOTE — Progress Notes (Signed)
Notified Dr. Garlan Fillers that pt having pain 10 in rt arm and rt leg, gave dilaudid and ultram. Pt's BP is 167/112, HR 105. MD ordered Dilaudid 1mg  IV x1. Will continue to monitor pt. Ranelle Oyster, RN

## 2017-08-03 NOTE — Progress Notes (Signed)
ANTIBIOTIC CONSULT NOTE - INITIAL  Pharmacy Consult for Vancomycin Indication: Septic bursitis   Allergies  Allergen Reactions  . Vicodin [Hydrocodone-Acetaminophen] Itching and Rash    Patient Measurements: Height: 5\' 6"  (167.6 cm) Weight: 226 lb 6.6 oz (102.7 kg) IBW/kg (Calculated) : 59.3   Vital Signs: Temp: 98.7 F (37.1 C) (12/04 0456) BP: 118/67 (12/04 0916) Pulse Rate: 103 (12/04 0456) Intake/Output from previous day: 12/03 0701 - 12/04 0700 In: 1216.7 [P.O.:480; I.V.:736.7] Out: -  Intake/Output from this shift: Total I/O In: 120 [P.O.:120] Out: 1000 [Urine:1000]  Labs: Recent Labs    08/02/17 1325 08/03/17 0428 08/03/17 0552  WBC 12.5* 16.5*  --   HGB 13.7 13.6  --   PLT 144* 128*  --   CREATININE 0.88  --  0.79   Estimated Creatinine Clearance: 133.6 mL/min (by C-G formula based on SCr of 0.79 mg/dL). No results for input(s): VANCOTROUGH, VANCOPEAK, VANCORANDOM, GENTTROUGH, GENTPEAK, GENTRANDOM, TOBRATROUGH, TOBRAPEAK, TOBRARND, AMIKACINPEAK, AMIKACINTROU, AMIKACIN in the last 72 hours.   Microbiology: No results found for this or any previous visit (from the past 720 hour(s)).  Medical History: Past Medical History:  Diagnosis Date  . Lupus   . Migraines   . Mixed connective tissue disease ([place])     Assessment: CC/HPI: knee pain and swelling 11/29 with purulent discharge>cellulitis  PMH: Mixed connective tissue disorder, lupus, migraines, tobacco  ID: r/o septic bursitis of the infrapatellar bursa. Pt refusing aspiration. Afebrile. WBC 16.5 up.   Doxycycline 12/4 Vanco 12/4>>  12/3 BC x 2>>  Goal of Therapy:  Vanco trough 10-15  Plan:  Vancomycin 1g IV q 8 hrs. Vanco trough after 3-5 doses at steady state.  Crystal S. Alford Highland, PharmD, BCPS Clinical Staff Pharmacist Pager [phone]  Eilene Ghazi Stillinger 08/03/2017,11:50 AM
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
## 2017-08-04 ENCOUNTER — Ambulatory Visit: Payer: Medicaid Other | Admitting: Obstetrics

## 2017-08-04 LAB — CBC
HCT: 38.3 % (ref 36.0–46.0)
Hemoglobin: 12.3 g/dL (ref 12.0–15.0)
MCH: 31 pg (ref 26.0–34.0)
MCHC: 32.1 g/dL (ref 30.0–36.0)
MCV: 96.5 fL (ref 78.0–100.0)
Platelets: 156 10*3/uL (ref 150–400)
RBC: 3.97 MIL/uL (ref 3.87–5.11)
RDW: 13.3 % (ref 11.5–15.5)
WBC: 15.1 10*3/uL — ABNORMAL HIGH (ref 4.0–10.5)

## 2017-08-04 LAB — BASIC METABOLIC PANEL
Anion gap: 14 (ref 5–15)
BUN: 9 mg/dL (ref 6–20)
CO2: 19 mmol/L — ABNORMAL LOW (ref 22–32)
Calcium: 8.6 mg/dL — ABNORMAL LOW (ref 8.9–10.3)
Chloride: 99 mmol/L — ABNORMAL LOW (ref 101–111)
Creatinine, Ser: 0.84 mg/dL (ref 0.44–1.00)
GFR calc Af Amer: >60 mL/min (ref >60)
GFR calc non Af Amer: >60 mL/min (ref >60)
Glucose, Bld: 92 mg/dL (ref 65–99)
Potassium: 3.8 mmol/L (ref 3.5–5.1)
Sodium: 132 mmol/L — ABNORMAL LOW (ref 135–145)

## 2017-08-04 MED ORDER — ACETAMINOPHEN 325 MG PO TABS
650.0000 mg | ORAL_TABLET | Freq: Four times a day (QID) | ORAL | Status: DC
  Administered 2017-08-04 – 2017-08-08 (×16): 650 mg via ORAL
  Filled 2017-08-04 (×17): qty 2

## 2017-08-04 NOTE — Evaluation (Signed)
Physical Therapy Evaluation Patient Details Name: Linda [name] MRN: [id number] DOB: 1996/07/19 Today's Date: 08/04/2017   History of Present Illness  21 yo female with known connective tissue disease and multiple admissions for right knee effusion presumed to be autoimmune in etiology who presents today with the same.  States she has been in pain for past few weeks, but worse past several days. Imaging on 08/02/17 of R knee showing Cellulitis along the anterior aspect of the knee and R wrist showing no acute fracture or dislocation.  Clinical Impression  Patient presents with decreased independence and safety with mobility due to pain in R knee, decreased tolerance to activity and decreased balance due to self limiting weight on R LE.  She will benefit from skilled PT in the acute setting to allow return home with intermittent help and follow up PT if able to get charity care.  She will need a RW for home.     Follow Up Recommendations Supervision for mobility/OOB;Home health PT    Equipment Recommendations  Rolling walker with 5" wheels    Recommendations for Other Services       Precautions / Restrictions Precautions Precautions: Fall Required Braces or Orthoses: Knee Immobilizer - Right      Mobility  Bed Mobility Overal bed mobility: Needs Assistance Bed Mobility: Supine to Sit;Sit to Supine     Supine to sit: Min assist Sit to supine: Min assist   General bed mobility comments: Min A to manage RLE  Transfers Overall transfer level: Needs assistance Equipment used: Rolling walker (2 wheeled) Transfers: Sit to/from Stand Sit to Stand: Min guard         General transfer comment: cues for hand placement and technique  Ambulation/Gait Ambulation/Gait assistance: Min guard Ambulation Distance (Feet): 15 Feet Assistive device: Rolling walker (2 wheeled) Gait Pattern/deviations: Step-to pattern;Decreased stride length;Antalgic     General Gait Details: slow and  initially keeping R leg NWB, but able to place on floor during teeth brushing at sink and during walk back to bed  Stairs            Wheelchair Mobility    Modified Rankin (Stroke Patients Only)       Balance Overall balance assessment: Needs assistance   Sitting balance-Leahy Scale: Good     Standing balance support: No upper extremity supported;During functional activity Standing balance-Leahy Scale: Fair Standing balance comment: stood at sink to brush teeth with one UE supported, then washed face with both hands on cloth and no UE support with R foot on floor                             Pertinent Vitals/Pain Pain Assessment: 0-10 Pain Score: 8  Pain Location: R knee Pain Descriptors / Indicators: Aching;Tender Pain Intervention(s): Monitored during session;Repositioned    Home Living Family/patient expects to be discharged to:: Private residence Living Arrangements: Parent Available Help at Discharge: Available PRN/intermittently Type of Home: Mobile home Home Access: Stairs to enter Entrance Stairs-Rails: Can reach both Entrance Stairs-Number of Steps: 4-5 Home Layout: One level Home Equipment: None Additional Comments: Reports stayed in bed 4 days PTA due to pain    Prior Function Level of Independence: Independent         Comments: ADL, IADLs, driving     Hand Dominance        Extremity/Trunk Assessment   Upper Extremity Assessment Upper Extremity Assessment: Defer to OT evaluation  Lower Extremity Assessment Lower Extremity Assessment: RLE deficits/detail RLE Deficits / Details: R knee limited AROM due to pain, did not want to use knee immobilizer, able to flex ankle and encouraged to do so for circulation, lifting leg for OOB with her hands RLE: Unable to fully assess due to pain       Communication   Communication: No difficulties  Cognition Arousal/Alertness: Awake/alert Behavior During Therapy: WFL for tasks  assessed/performed Overall Cognitive Status: Within Functional Limits for tasks assessed                                        General Comments General comments (skin integrity, edema, etc.): patient finishing up bathing and tech called to change linens while pt up OOB; attempted earlier today to see pt, but she refused due to sleeping and not sleeping at night    Exercises Total Joint Exercises Ankle Circles/Pumps: AROM;5 reps;Supine;Both Quad Sets: AROM;5 reps;Supine;Right   Assessment/Plan    PT Assessment Patient needs continued PT services  PT Problem List Decreased mobility;Decreased range of motion;Pain;Decreased knowledge of use of DME;Decreased balance;Decreased activity tolerance;Decreased knowledge of precautions       PT Treatment Interventions DME instruction;Therapeutic activities;Gait training;Therapeutic exercise;Patient/family education;Balance training;Stair training;Functional mobility training    PT Goals (Current goals can be found in the Care Plan section)  Acute Rehab PT Goals Patient Stated Goal: get better PT Goal Formulation: With patient Time For Goal Achievement: 08/11/17 Potential to Achieve Goals: Good    Frequency Min 3X/week   Barriers to discharge        Co-evaluation               AM-PAC PT "6 Clicks" Daily Activity  Outcome Measure Difficulty turning over in bed (including adjusting bedclothes, sheets and blankets)?: None Difficulty moving from lying on back to sitting on the side of the bed? : Unable Difficulty sitting down on and standing up from a chair with arms (e.g., wheelchair, bedside commode, etc,.)?: Unable Help needed moving to and from a bed to chair (including a wheelchair)?: A Little Help needed walking in hospital room?: A Little Help needed climbing 3-5 steps with a railing? : A Lot 6 Click Score: 14    End of Session Equipment Utilized During Treatment: Gait belt Activity Tolerance: Patient  tolerated treatment well Patient left: in bed;with call bell/phone within reach   PT Visit Diagnosis: Difficulty in walking, not elsewhere classified (R26.2);Pain Pain - Right/Left: Right Pain - part of body: Knee    Time: 1530-1556 PT Time Calculation (min) (ACUTE ONLY): 26 min   Charges:   PT Evaluation $PT Eval Moderate Complexity: 1 Mod PT Treatments $Gait Training: 8-22 mins   PT G CodesMagda Kiel, [place] [phone] 08/04/2017   Reginia Naas 08/04/2017, 4:42 PM

## 2017-08-04 NOTE — H&P (View-Only) (Signed)
Patient ID: Linda [name], female   DOB: [birth year], 21 y.o.   MRN: [id number]   LOS: 1 day   Subjective: Knee still hurts but a bit improved over yesterday. Scant drainage from anterior knee lesion.   Objective: Vital signs in last 24 hours: Temp:  [97.8 F (36.6 C)-98.3 F (36.8 C)] 97.8 F (36.6 C) (12/05 0538) Pulse Rate:  [86-97] 86 (12/05 0538) Resp:  [18-20] 18 (12/05 0538) BP: (112-125)/(58-70) 112/58 (12/05 0538) SpO2:  [99 %-100 %] 100 % (12/05 0538) Last BM Date: 08/01/17   Laboratory  CBC Recent Labs    08/03/17 0428 08/04/17 0416  WBC 16.5* 15.1*  HGB 13.6 12.3  HCT 40.7 38.3  PLT 128* 156   BMET Recent Labs    08/03/17 0552 08/04/17 0416  NA 131* 132*  K 3.6 3.8  CL 98* 99*  CO2 23 19*  GLUCOSE 93 92  BUN 5* 9  CREATININE 0.79 0.84  CALCIUM 8.7* 8.6*     Physical Exam General appearance: alert and no distress  RLE: Knee swollen, severe TTP (OOP). Motion slightly improved to ~170 to 135.   Assessment/Plan: Right knee pain -- Given MRI and location of draining tract I suspect she has a septic bursitis of the infrapatellar bursa. Given symptomatic and objective improvement would hold off on joint aspiration. Could still consider aspiration of bursa but would reserve this for plateau or worsening. Continue vancomycin per pharmacy. Encouraged knee ROM, KI may be used for comfort (not currently). Dr. Lennette Bihari Haddix to take over orthopedic care and will see today.    Lisette Abu, PA-C Orthopedic Surgery [phone] 08/04/2017

## 2017-08-04 NOTE — Progress Notes (Signed)
Patient ID: Linda [name], female   DOB: 02-13-1996, 21 y.o.   MRN: [id number]   LOS: 1 day   Subjective: Knee still hurts but a bit improved over yesterday. Scant drainage from anterior knee lesion.   Objective: Vital signs in last 24 hours: Temp:  [97.8 F (36.6 C)-98.3 F (36.8 C)] 97.8 F (36.6 C) (12/05 0538) Pulse Rate:  [86-97] 86 (12/05 0538) Resp:  [18-20] 18 (12/05 0538) BP: (112-125)/(58-70) 112/58 (12/05 0538) SpO2:  [99 %-100 %] 100 % (12/05 0538) Last BM Date: 08/01/17   Laboratory  CBC Recent Labs    08/03/17 0428 08/04/17 0416  WBC 16.5* 15.1*  HGB 13.6 12.3  HCT 40.7 38.3  PLT 128* 156   BMET Recent Labs    08/03/17 0552 08/04/17 0416  NA 131* 132*  K 3.6 3.8  CL 98* 99*  CO2 23 19*  GLUCOSE 93 92  BUN 5* 9  CREATININE 0.79 0.84  CALCIUM 8.7* 8.6*     Physical Exam General appearance: alert and no distress  RLE: Knee swollen, severe TTP (OOP). Motion slightly improved to ~170 to 135.   Assessment/Plan: Right knee pain -- Given MRI and location of draining tract I suspect she has a septic bursitis of the infrapatellar bursa. Given symptomatic and objective improvement would hold off on joint aspiration. Could still consider aspiration of bursa but would reserve this for plateau or worsening. Continue vancomycin per pharmacy. Encouraged knee ROM, KI may be used for comfort (not currently). Dr. Lennette Bihari Haddix to take over orthopedic care and will see today.    Lisette Abu, PA-C Orthopedic Surgery [phone] 08/04/2017

## 2017-08-04 NOTE — Progress Notes (Signed)
Family Medicine Teaching Service Daily Progress Note Intern Pager: [phone]  Patient name: Linda [name] Medical record number: [id number] Date of birth: 10-31-1995 Age: 21 y.o. Gender: female  Primary Care Provider: Tonette Bihari, MD Consultants: none Code Status: Full  Pt Overview and Major Events to Date:  12/03: Admitted for R knee swelling and found to have cellulitis  Assessment and Plan: Linda [name] is a 21 y.o. female presenting with right knee pain and swelling. PMH is significant for lupus, connective tissue disease, HTN, obesity.  Cellulitis/ Septic bursitis of R knee: Afebrile.Leukocytosis to 12.5> 16.5> 15.1.  -Scheduled Tylenol -Oxy PRN for severe pain --IV vanc for presumed septic bursitis- will transition to oral after 48 hours of treatment- will need 2-3 weeks of treatment for suspected septic bursitis --Blood cultures pending -Ortho consulted and following, appreciate recommendations --ESR 21, CRP 12.7  R Upper Arm pain and swelling: Improving. Pain and swelling started with knee pain. Marland Kitchen  -- MRI of R upper arm: No MRI evidence of cellulitis. No drainable fluid collection to suggest an abscess.  HTN: elevated BP likely due to poorly controlled HTN from non-compliance with medications, but pain is also likely contributing. Improved after receiving medications.   - Scheduled Tylenol - Restart home amlodipine 10 mg daily, HCTZ 25 mg daily, lisinopril 20 mg daily  Leukocytosis: Improving.WBC 12.5> 16.5>15.1. Patient not taking prednisone at home at this point.  - Daily CBC  History of connective tissue disease and lupus: Likely contributing to knee pain and swelling - ESR 21and CRP 12.7 - Patient has f/u with rheumatology at [facility] in February, previously missed appointment- now that she has medicaid could try to get her seen by local rheumatologist   FEN/GI: Regular diet Prophylaxis: Lovenox  Disposition: Home  Subjective:  Patient and mom  in room agreeable to changing medications to Tylenol scheduled and oxygen as needed.  Reports that PT did not go well yesterday because of pain.  Patient thinks that she is doing better as she is able to now move her leg from side to side. pain seems to be out of proportion to exam.  Objective: Temp:  [97.8 F (36.6 C)-98.3 F (36.8 C)] 97.8 F (36.6 C) (12/05 0538) Pulse Rate:  [86-97] 86 (12/05 0538) Resp:  [18-20] 18 (12/05 0538) BP: (112-125)/(58-70) 112/58 (12/05 0538) SpO2:  [99 %-100 %] 100 % (12/05 0538) Physical Exam: General: NAD, laying in bed Neck: Supple, no LAD Cardiovascular: RRR, no m/r/g, no LE edema Respiratory: CTA BL, normal work of breathing MSK: Moves upper extremities spontaneously, limited ROM of R elbow. Fluctuance of R elbow. Unable to flex right knee due to pain. Able to flex left knee with some pain. Bilateral knees swollen, clear drainage noted over R knee. Difficult to identify landmarks of right patella secondary to swelling. No fluctuance appreciated. Derm:Diffuse circumferential areas of hyperpigmentation and crusting involving all surfaces of the skin Psych: AOx3, appropriate affect  Laboratory: Recent Labs  Lab 08/02/17 1325 08/03/17 0428 08/04/17 0416  WBC 12.5* 16.5* 15.1*  HGB 13.7 13.6 12.3  HCT 42.6 40.7 38.3  PLT 144* 128* 156   Recent Labs  Lab 08/02/17 1325 08/03/17 0552 08/04/17 0416  NA 134* 131* 132*  K 3.4* 3.6 3.8  CL 101 98* 99*  CO2 24 23 19*  BUN 5* 5* 9  CREATININE 0.88 0.79 0.84  CALCIUM 9.3 8.7* 8.6*  GLUCOSE 83 93 92   CRP 12.7 ESR 21  Imaging/Diagnostic Tests: Dg Knee 2  Views Right  Result Date: 08/02/2017 CLINICAL DATA:  Right knee swelling and drainage. History of mixed connective tissue disease. EXAM: RIGHT KNEE - 1-2 VIEW COMPARISON:  Radiographs 04/09/2017.  MRI 04/21/2017. FINDINGS: The mineralization and alignment are normal. There is no evidence of acute fracture or dislocation. The joint spaces are  maintained. Extensive soft tissue calcifications are again noted anteriorly, similar to previous studies. No significant joint effusion identified. IMPRESSION: Stable radiographic appearance of the right knee with extensive soft tissue calcifications consistent with history of mixed connective tissue disease. No acute osseous findings. Electronically Signed   By: Richardean Sale M.D.   On: 08/02/2017 14:54   Mr Humerus Right W Wo Contrast  Result Date: 08/02/2017 CLINICAL DATA:  Having significant pain to the right upper arm with swelling. She has pain superficially to the skin. EXAM: MRI OF THE RIGHT HUMERUS WITHOUT AND WITH CONTRAST TECHNIQUE: Multiplanar, multisequence MR imaging of the right humerus was performed before and after the administration of intravenous contrast. CONTRAST:  47m MULTIHANCE GADOBENATE DIMEGLUMINE 529 MG/ML IV SOLN COMPARISON:  None. FINDINGS: Bones/Joint/Cartilage No fracture or dislocation. Normal alignment. No joint effusion. No periosteal reaction or bone destruction. No aggressive osseous lesion. Ligaments Ligaments are suboptimally evaluated by CT. Muscles and Tendons Mild edema and enhancement in the proximal short head of the biceps muscle at the muscular tendinous junction which may reflect muscle strain versus mild myositis secondary to an infectious or inflammatory etiology. Muscles are otherwise normal in signal. No intramuscular fluid collection or hematoma. Supraspinatus tendon is intact. Infraspinatus tendon is intact. Teres minor tendon is intact. Subscapularis tendon is intact. Soft tissue No soft tissue edema within the subcutaneous fat. No fluid collection or hematoma. No soft tissue mass. IMPRESSION: 1. Mild edema and enhancement in the proximal short head of the biceps muscle at the muscular tendinous junction which may reflect muscle strain versus mild myositis secondary to an infectious or inflammatory etiology. 2. No MRI evidence of cellulitis. No drainable  fluid collection to suggest an abscess. Electronically Signed   By: HKathreen Devoid  On: 08/02/2017 21:57   Mr Knee Right W Wo Contrast  Result Date: 08/02/2017 CLINICAL DATA:  21yo F with Lupus/mixed connective tissue disease with a chief complaint of recurrent cellulitis. This has been going on for the past few days. Having significant pain to the right knee with swelling. She has pain superficially to the skin. EXAM: MRI OF THE RIGHT KNEE WITHOUT AND WITH CONTRAST TECHNIQUE: Multiplanar, multisequence MR imaging of the right knee was performed both before and after administration of intravenous contrast. CONTRAST:  256mMULTIHANCE GADOBENATE DIMEGLUMINE 529 MG/ML IV SOLN COMPARISON:  None. FINDINGS: MENISCI Medial meniscus:  Intact. Lateral meniscus:  Intact. LIGAMENTS Cruciates:  Intact ACL and PCL. Collaterals: Medial collateral ligament is intact. Lateral collateral ligament complex is intact. CARTILAGE Patellofemoral:  No chondral defect. Medial:  No chondral defect. Lateral:  No chondral defect. Joint: No joint effusion. Mild edema in Hoffa's fat. No plical thickening. Small amount of fluid in the deep infrapatellar bursa. Popliteal Fossa:  No Baker cyst.  Intact popliteus tendon. Extensor Mechanism: Intact quadriceps tendon and patellar tendon. Intact medial and lateral patellar retinaculum. Intact MPFL. Bones: No marrow signal abnormality. No fracture or dislocation. No aggressive osseous lesion. No periosteal reaction or bone destruction. Soft tissue: Dystrophic calcification within the subcutaneous fat anterior to the distal quadriceps tendon, patella and patellar tendon consistent with the patient's history of connective tissue disease. Soft tissue edema in  the subcutaneous fat along the anterior aspect of the knee with enhancement on postcontrast imaging most concerning for cellulitis. No focal fluid collection to suggest an abscess. IMPRESSION: 1. No internal derangement of the right knee. 2.  Cellulitis along the anterior aspect of the knee. No drainable fluid collection to suggest a hematoma. 3. Small amount of fluid in the deep infrapatellar bursa consistent with bursitis with mild adjacent soft tissue edema in Hoffa's fat. 4. No significant joint effusion to suggest septic arthritis. Electronically Signed   By: Kathreen Devoid   On: 08/02/2017 22:02    Shirley, [place], DO 08/04/2017, 6:42 AM PGY-1, [place] Intern pager: [phone], text pages welcome

## 2017-08-05 LAB — BASIC METABOLIC PANEL
Anion gap: 8 (ref 5–15)
BUN: <5 mg/dL — ABNORMAL LOW (ref 6–20)
CO2: 26 mmol/L (ref 22–32)
Calcium: 8.8 mg/dL — ABNORMAL LOW (ref 8.9–10.3)
Chloride: 102 mmol/L (ref 101–111)
Creatinine, Ser: 0.81 mg/dL (ref 0.44–1.00)
GFR calc Af Amer: >60 mL/min (ref >60)
GFR calc non Af Amer: >60 mL/min (ref >60)
Glucose, Bld: 85 mg/dL (ref 65–99)
Potassium: 3.7 mmol/L (ref 3.5–5.1)
Sodium: 136 mmol/L (ref 135–145)

## 2017-08-05 LAB — CBC
HCT: 36 % (ref 36.0–46.0)
Hemoglobin: 11.5 g/dL — ABNORMAL LOW (ref 12.0–15.0)
MCH: 30.7 pg (ref 26.0–34.0)
MCHC: 31.9 g/dL (ref 30.0–36.0)
MCV: 96.3 fL (ref 78.0–100.0)
Platelets: 173 10*3/uL (ref 150–400)
RBC: 3.74 MIL/uL — ABNORMAL LOW (ref 3.87–5.11)
RDW: 13.4 % (ref 11.5–15.5)
WBC: 11.9 10*3/uL — ABNORMAL HIGH (ref 4.0–10.5)

## 2017-08-05 LAB — MRSA PCR SCREENING: MRSA by PCR: NEGATIVE

## 2017-08-05 MED ORDER — POVIDONE-IODINE 10 % EX SWAB
2.0000 "application " | Freq: Once | CUTANEOUS | Status: AC
  Administered 2017-08-06: 2 via TOPICAL

## 2017-08-05 MED ORDER — CHLORHEXIDINE GLUCONATE 4 % EX LIQD
60.0000 mL | Freq: Once | CUTANEOUS | Status: AC
  Administered 2017-08-06: 4 via TOPICAL
  Filled 2017-08-05 (×2): qty 60

## 2017-08-05 NOTE — Progress Notes (Signed)
Patient ID: Linda [name], female   DOB: 08/20/1996, 21 y.o.   MRN: [id number]   LOS: 2 days   Subjective: Continues with subjective improvement, moving leg better   Objective: Vital signs in last 24 hours: Temp:  [97.5 F (36.4 C)-97.6 F (36.4 C)] 97.6 F (36.4 C) (12/05 1321) Pulse Rate:  [83-102] 102 (12/06 0547) Resp:  [18] 18 (12/06 0547) BP: (109-139)/(56-88) 122/82 (12/06 0547) SpO2:  [97 %-100 %] 100 % (12/06 0547) Last BM Date: 08/01/17   Laboratory  CBC Recent Labs    08/03/17 0428 08/04/17 0416  WBC 16.5* 15.1*  HGB 13.6 12.3  HCT 40.7 38.3  PLT 128* 156   BMET Recent Labs    08/03/17 0552 08/04/17 0416  NA 131* 132*  K 3.6 3.8  CL 98* 99*  CO2 23 19*  GLUCOSE 93 92  BUN 5* 9  CREATININE 0.79 0.84  CALCIUM 8.7* 8.6*     Physical Exam General appearance: alert and no distress  RLE: Appearance unchanged, ROM 180-100, painful at extremes   Assessment/Plan: Right knee pain-- Given MRI and location of draining tract I suspect she has a septic bursitis of the infrapatellar bursa. Given symptomatic and objective improvement would hold off on joint aspiration. Could still consider aspiration of bursa but would reserve this for plateau or worsening. OK to change to oral abx. Encouraged knee ROM, KI may be used for comfort (not currently). Dr. Lennette Bihari Haddix to take over orthopedic care she may f/u in office as needed.    Lisette Abu, PA-C Orthopedic Surgery [phone] 08/05/2017

## 2017-08-05 NOTE — Discharge Instructions (Signed)
It was a pleasure caring for you while you were admitted to the hospital!   You were admitted and found to have septic bursitis. This will require 2 weeks of antibiotic therapy. It is important that if you develop fevers, have much worse pain, swelling, or redness to call your doctor.

## 2017-08-05 NOTE — Progress Notes (Signed)
Family Medicine Teaching Service Daily Progress Note Intern Pager: [phone]  Patient name: Linda [name] Medical record number: [id number] Date of birth: 1996/01/21 Age: 21 y.o. Gender: female  Primary Care Provider: Tonette Bihari, MD Consultants: none Code Status: Full  Pt Overview and Major Events to Date:  12/03: Admitted for R knee swelling and found to have cellulitis  Assessment and Plan: Linda [name] is a 21 y.o. female presenting with right knee pain and swelling. PMH is significant for lupus, connective tissue disease, HTN, obesity.  Cellulitis/septic bursitis of R knee: Afebrile.Leukocytosis to 12.5> 16.5> 15.1.  -Scheduled Tylenol -Oxy PRN for severe pain --IV vanc for presumed septic bursitis- will transition to oral doxycycline for 14 days prior to discharge, unless surgery recommends something different --Blood cultures pending -Ortho consulted and following, appreciate recommendations- will do I&D tomorrow morning- NPO at midnight  Right upper arm pain and swelling: Improving.  Stable.  Full range of motion -- MRI of R upper arm: No MRI evidence of cellulitis. No drainable fluid collection to suggest an abscess.  HTN: elevated BP likely due to poorly controlled HTN from non-compliance with medications, but pain is also likely contributing.  Improving. - Scheduled Tylenol for pain  - Restart home amlodipine 10 mg daily. - Discontinued HCTZ 25 mg daily and lisinopril 20 mg daily  Leukocytosis: WBC 12.5> 16.5>15.1.  - Daily CBC-unable to collect CBC this morning  History of connective tissue disease and lupus: Likely contributing to knee pain and swelling - ESR 21and CRP 12.7 - Patient has f/u with rheumatology at [facility] in February, previously missed appointment  FEN/GI: Regular diet Prophylaxis: Lovenox  Disposition: Home  Subjective:  Patient patient reports that scheduled Tylenol and OxyContin as needed or helping her with her pain.  She  reports that her pain still hurts over the area of drainage.  She is able to move her leg more today.  Patient also reports that she has not had a bowel movement yet but she is having flatulence.  Objective: Temp:  [97.6 F (36.4 C)] 97.6 F (36.4 C) (12/05 1321) Pulse Rate:  [83-102] 102 (12/06 0547) Resp:  [18] 18 (12/06 0547) BP: (121-139)/(69-95) 137/95 (12/06 0949) SpO2:  [98 %-100 %] 100 % (12/06 0547) Physical Exam: General: NAD, laying in bed Neck: Supple, no LAD Cardiovascular: RRR, no m/r/g, no LE edema Respiratory: CTA BL, normal work of breathing MSK: Moves upper extremities spontaneously, FROM of R elbow. Able to flex right knee to 85 degrees and straighten to 180 degrees.  Full range of motion of left knee. Bilateral knees swollen, drainage noted over R knee. Difficult to identify landmarks of right patella secondary to swelling. No fluctuance appreciated. Derm:Diffuse circumferential areas of hyperpigmentation and crusting involving all surfaces of the skin Psych: AOx3, appropriate affect  Laboratory: Recent Labs  Lab 08/02/17 1325 08/03/17 0428 08/04/17 0416  WBC 12.5* 16.5* 15.1*  HGB 13.7 13.6 12.3  HCT 42.6 40.7 38.3  PLT 144* 128* 156   Recent Labs  Lab 08/02/17 1325 08/03/17 0552 08/04/17 0416  NA 134* 131* 132*  K 3.4* 3.6 3.8  CL 101 98* 99*  CO2 24 23 19*  BUN 5* 5* 9  CREATININE 0.88 0.79 0.84  CALCIUM 9.3 8.7* 8.6*  GLUCOSE 83 93 92   CRP 12.7 ESR 21  Imaging/Diagnostic Tests: Dg Knee 2 Views Right  Result Date: 08/02/2017 CLINICAL DATA:  Right knee swelling and drainage. History of mixed connective tissue disease. EXAM: RIGHT KNEE -  1-2 VIEW COMPARISON:  Radiographs 04/09/2017.  MRI 04/21/2017. FINDINGS: The mineralization and alignment are normal. There is no evidence of acute fracture or dislocation. The joint spaces are maintained. Extensive soft tissue calcifications are again noted anteriorly, similar to previous studies. No  significant joint effusion identified. IMPRESSION: Stable radiographic appearance of the right knee with extensive soft tissue calcifications consistent with history of mixed connective tissue disease. No acute osseous findings. Electronically Signed   By: Richardean Sale M.D.   On: 08/02/2017 14:54   Mr Humerus Right W Wo Contrast  Result Date: 08/02/2017 CLINICAL DATA:  Having significant pain to the right upper arm with swelling. She has pain superficially to the skin. EXAM: MRI OF THE RIGHT HUMERUS WITHOUT AND WITH CONTRAST TECHNIQUE: Multiplanar, multisequence MR imaging of the right humerus was performed before and after the administration of intravenous contrast. CONTRAST:  48m MULTIHANCE GADOBENATE DIMEGLUMINE 529 MG/ML IV SOLN COMPARISON:  None. FINDINGS: Bones/Joint/Cartilage No fracture or dislocation. Normal alignment. No joint effusion. No periosteal reaction or bone destruction. No aggressive osseous lesion. Ligaments Ligaments are suboptimally evaluated by CT. Muscles and Tendons Mild edema and enhancement in the proximal short head of the biceps muscle at the muscular tendinous junction which may reflect muscle strain versus mild myositis secondary to an infectious or inflammatory etiology. Muscles are otherwise normal in signal. No intramuscular fluid collection or hematoma. Supraspinatus tendon is intact. Infraspinatus tendon is intact. Teres minor tendon is intact. Subscapularis tendon is intact. Soft tissue No soft tissue edema within the subcutaneous fat. No fluid collection or hematoma. No soft tissue mass. IMPRESSION: 1. Mild edema and enhancement in the proximal short head of the biceps muscle at the muscular tendinous junction which may reflect muscle strain versus mild myositis secondary to an infectious or inflammatory etiology. 2. No MRI evidence of cellulitis. No drainable fluid collection to suggest an abscess. Electronically Signed   By: HKathreen Devoid  On: 08/02/2017 21:57   Mr  Knee Right W Wo Contrast  Result Date: 08/02/2017 CLINICAL DATA:  21yo F with Lupus/mixed connective tissue disease with a chief complaint of recurrent cellulitis. This has been going on for the past few days. Having significant pain to the right knee with swelling. She has pain superficially to the skin. EXAM: MRI OF THE RIGHT KNEE WITHOUT AND WITH CONTRAST TECHNIQUE: Multiplanar, multisequence MR imaging of the right knee was performed both before and after administration of intravenous contrast. CONTRAST:  234mMULTIHANCE GADOBENATE DIMEGLUMINE 529 MG/ML IV SOLN COMPARISON:  None. FINDINGS: MENISCI Medial meniscus:  Intact. Lateral meniscus:  Intact. LIGAMENTS Cruciates:  Intact ACL and PCL. Collaterals: Medial collateral ligament is intact. Lateral collateral ligament complex is intact. CARTILAGE Patellofemoral:  No chondral defect. Medial:  No chondral defect. Lateral:  No chondral defect. Joint: No joint effusion. Mild edema in Hoffa's fat. No plical thickening. Small amount of fluid in the deep infrapatellar bursa. Popliteal Fossa:  No Baker cyst.  Intact popliteus tendon. Extensor Mechanism: Intact quadriceps tendon and patellar tendon. Intact medial and lateral patellar retinaculum. Intact MPFL. Bones: No marrow signal abnormality. No fracture or dislocation. No aggressive osseous lesion. No periosteal reaction or bone destruction. Soft tissue: Dystrophic calcification within the subcutaneous fat anterior to the distal quadriceps tendon, patella and patellar tendon consistent with the patient's history of connective tissue disease. Soft tissue edema in the subcutaneous fat along the anterior aspect of the knee with enhancement on postcontrast imaging most concerning for cellulitis. No focal fluid collection to  suggest an abscess. IMPRESSION: 1. No internal derangement of the right knee. 2. Cellulitis along the anterior aspect of the knee. No drainable fluid collection to suggest a hematoma. 3. Small  amount of fluid in the deep infrapatellar bursa consistent with bursitis with mild adjacent soft tissue edema in Hoffa's fat. 4. No significant joint effusion to suggest septic arthritis. Electronically Signed   By: Kathreen Devoid   On: 08/02/2017 22:02    Shirley, [place], DO 08/05/2017, 11:55 AM PGY-1, [place] Intern pager: [phone], text pages welcome

## 2017-08-05 NOTE — Progress Notes (Signed)
PT Cancellation Note  Patient Details Name: Linda [name] MRN: [id number] DOB: 1995/12/19   Cancelled Treatment:    Reason Eval/Treat Not Completed: Fatigue/lethargy limiting ability to participate;Patient declined, no reason specified(Chart reviewed, treatment attempted. Pt asks to defer PT at this time due to sleeping and feeling exhausted. Pt educated on the risks of prolonged bed rest, encouraged to participate, but she asks to defer to later. ) Will attempt again at later date/time.   11:39 AM, 08/05/17 Etta Grandchild, PT, DPT [facility] - [place] [phone] (Pager)  [phone] [facility])  [phone] (Office)      Buccola,Allan C 08/05/2017, 11:39 AM

## 2017-08-06 ENCOUNTER — Inpatient Hospital Stay (HOSPITAL_COMMUNITY): Payer: Medicaid Other | Admitting: Certified Registered Nurse Anesthetist

## 2017-08-06 ENCOUNTER — Telehealth: Payer: Self-pay | Admitting: Internal Medicine

## 2017-08-06 ENCOUNTER — Encounter (HOSPITAL_COMMUNITY): Payer: Self-pay | Admitting: Certified Registered Nurse Anesthetist

## 2017-08-06 ENCOUNTER — Encounter (HOSPITAL_COMMUNITY)
Admission: EM | Disposition: A | Discharge status: Home Health | Payer: Self-pay | Source: Home / Self Care | Attending: Family Medicine

## 2017-08-06 HISTORY — PX: I&D EXTREMITY: SHX5045

## 2017-08-06 LAB — BASIC METABOLIC PANEL
Anion gap: 11 (ref 5–15)
BUN: <5 mg/dL — ABNORMAL LOW (ref 6–20)
CO2: 25 mmol/L (ref 22–32)
Calcium: 9.2 mg/dL (ref 8.9–10.3)
Chloride: 100 mmol/L — ABNORMAL LOW (ref 101–111)
Creatinine, Ser: 0.78 mg/dL (ref 0.44–1.00)
GFR calc Af Amer: >60 mL/min (ref >60)
GFR calc non Af Amer: >60 mL/min (ref >60)
Glucose, Bld: 88 mg/dL (ref 65–99)
Potassium: 3.8 mmol/L (ref 3.5–5.1)
Sodium: 136 mmol/L (ref 135–145)

## 2017-08-06 LAB — CBC
HCT: 36.7 % (ref 36.0–46.0)
Hemoglobin: 11.9 g/dL — ABNORMAL LOW (ref 12.0–15.0)
MCH: 30.9 pg (ref 26.0–34.0)
MCHC: 32.4 g/dL (ref 30.0–36.0)
MCV: 95.3 fL (ref 78.0–100.0)
Platelets: 207 10*3/uL (ref 150–400)
RBC: 3.85 MIL/uL — ABNORMAL LOW (ref 3.87–5.11)
RDW: 13.4 % (ref 11.5–15.5)
WBC: 11.5 10*3/uL — ABNORMAL HIGH (ref 4.0–10.5)

## 2017-08-06 SURGERY — IRRIGATION AND DEBRIDEMENT EXTREMITY
Anesthesia: General | Laterality: Right

## 2017-08-06 MED ORDER — HYDROMORPHONE HCL 1 MG/ML IJ SOLN
INTRAMUSCULAR | Status: AC
  Filled 2017-08-06: qty 1

## 2017-08-06 MED ORDER — VANCOMYCIN HCL POWD
Status: DC | PRN
  Administered 2017-08-06: 1000 mg via SURGICAL_CAVITY

## 2017-08-06 MED ORDER — FENTANYL CITRATE (PF) 100 MCG/2ML IJ SOLN
INTRAMUSCULAR | Status: DC | PRN
  Administered 2017-08-06 (×5): 50 ug via INTRAVENOUS

## 2017-08-06 MED ORDER — HYDROMORPHONE HCL 1 MG/ML IJ SOLN
1.0000 mg | INTRAMUSCULAR | Status: DC | PRN
  Administered 2017-08-06: 1 mg via INTRAVENOUS
  Filled 2017-08-06: qty 1

## 2017-08-06 MED ORDER — ONDANSETRON HCL 4 MG/2ML IJ SOLN
INTRAMUSCULAR | Status: AC
  Filled 2017-08-06: qty 2

## 2017-08-06 MED ORDER — KETAMINE HCL-SODIUM CHLORIDE 100-0.9 MG/10ML-% IV SOSY
PREFILLED_SYRINGE | INTRAVENOUS | Status: AC
  Filled 2017-08-06: qty 10

## 2017-08-06 MED ORDER — KETAMINE HCL 10 MG/ML IJ SOLN
INTRAMUSCULAR | Status: DC | PRN
  Administered 2017-08-06: 10 mg via INTRAVENOUS
  Administered 2017-08-06: 40 mg via INTRAVENOUS

## 2017-08-06 MED ORDER — OXYCODONE HCL 5 MG/5ML PO SOLN
5.0000 mg | Freq: Once | ORAL | Status: AC | PRN
Start: 2017-08-06 — End: 2017-08-06

## 2017-08-06 MED ORDER — VANCOMYCIN HCL 1000 MG IV SOLR
INTRAVENOUS | Status: AC
  Filled 2017-08-06: qty 1000

## 2017-08-06 MED ORDER — ONDANSETRON HCL 4 MG/2ML IJ SOLN
INTRAMUSCULAR | Status: DC | PRN
  Administered 2017-08-06: 4 mg via INTRAVENOUS

## 2017-08-06 MED ORDER — FENTANYL CITRATE (PF) 250 MCG/5ML IJ SOLN
INTRAMUSCULAR | Status: AC
  Filled 2017-08-06: qty 5

## 2017-08-06 MED ORDER — PROPOFOL 10 MG/ML IV BOLUS
INTRAVENOUS | Status: DC | PRN
  Administered 2017-08-06: 180 mg via INTRAVENOUS

## 2017-08-06 MED ORDER — DEXAMETHASONE SODIUM PHOSPHATE 10 MG/ML IJ SOLN
INTRAMUSCULAR | Status: AC
  Filled 2017-08-06: qty 1

## 2017-08-06 MED ORDER — MIDAZOLAM HCL 2 MG/2ML IJ SOLN
INTRAMUSCULAR | Status: AC
  Filled 2017-08-06: qty 2

## 2017-08-06 MED ORDER — SODIUM CHLORIDE 0.9 % IR SOLN
Status: DC | PRN
  Administered 2017-08-06: 3000 mL

## 2017-08-06 MED ORDER — MIDAZOLAM HCL 5 MG/5ML IJ SOLN
INTRAMUSCULAR | Status: DC | PRN
  Administered 2017-08-06 (×2): 2 mg via INTRAVENOUS

## 2017-08-06 MED ORDER — DEXAMETHASONE SODIUM PHOSPHATE 10 MG/ML IJ SOLN
INTRAMUSCULAR | Status: DC | PRN
  Administered 2017-08-06: 10 mg via INTRAVENOUS

## 2017-08-06 MED ORDER — OXYCODONE HCL 5 MG PO TABS
5.0000 mg | ORAL_TABLET | Freq: Once | ORAL | Status: AC | PRN
  Administered 2017-08-06: 5 mg via ORAL

## 2017-08-06 MED ORDER — OXYCODONE HCL 5 MG PO TABS
ORAL_TABLET | ORAL | Status: AC
  Filled 2017-08-06: qty 1

## 2017-08-06 MED ORDER — BACITRACIN ZINC 500 UNIT/GM EX OINT
TOPICAL_OINTMENT | CUTANEOUS | Status: AC
  Filled 2017-08-06: qty 28.35

## 2017-08-06 MED ORDER — HYDROMORPHONE HCL 1 MG/ML IJ SOLN
0.2500 mg | INTRAMUSCULAR | Status: DC | PRN
  Administered 2017-08-06 (×4): 0.5 mg via INTRAVENOUS

## 2017-08-06 MED ORDER — ONDANSETRON HCL 4 MG/2ML IJ SOLN: 4.0000 mg | Freq: Four times a day (QID) | INTRAMUSCULAR | Status: DC | PRN

## 2017-08-06 MED ORDER — 0.9 % SODIUM CHLORIDE (POUR BTL) OPTIME
TOPICAL | Status: DC | PRN
  Administered 2017-08-06: 1000 mL

## 2017-08-06 MED ORDER — PROPOFOL 10 MG/ML IV BOLUS
INTRAVENOUS | Status: AC
  Filled 2017-08-06: qty 40

## 2017-08-06 MED ORDER — HYDROMORPHONE HCL 1 MG/ML IJ SOLN
1.0000 mg | INTRAMUSCULAR | Status: DC | PRN
  Administered 2017-08-06 (×2): 1 mg via INTRAVENOUS
  Filled 2017-08-06 (×2): qty 1

## 2017-08-06 MED ORDER — LIDOCAINE 2% (20 MG/ML) 5 ML SYRINGE
INTRAMUSCULAR | Status: DC | PRN
  Administered 2017-08-06: 60 mg via INTRAVENOUS

## 2017-08-06 MED ORDER — LACTATED RINGERS IV SOLN
INTRAVENOUS | Status: DC | PRN
  Administered 2017-08-06: 07:00:00 via INTRAVENOUS

## 2017-08-06 MED FILL — Vancomycin HCl For IV Soln 1 GM (Base Equivalent): INTRAVENOUS | Qty: 1000 | Status: AC

## 2017-08-06 SURGICAL SUPPLY — 45 items
BANDAGE ELASTIC 3 VELCRO ST LF (GAUZE/BANDAGES/DRESSINGS) ×2 IMPLANT
BNDG COHESIVE 4X5 TAN STRL (GAUZE/BANDAGES/DRESSINGS) ×2 IMPLANT
BNDG GAUZE ELAST 4 BULKY (GAUZE/BANDAGES/DRESSINGS) IMPLANT
BRUSH SCRUB SURG 4.25 DISP (MISCELLANEOUS) ×4 IMPLANT
CHLORAPREP W/TINT 26ML (MISCELLANEOUS) ×4 IMPLANT
COVER MAYO STAND STRL (DRAPES) ×2 IMPLANT
COVER SURGICAL LIGHT HANDLE (MISCELLANEOUS) ×2 IMPLANT
DRAPE ORTHO SPLIT 77X108 STRL (DRAPES) ×1
DRAPE SURG 17X23 STRL (DRAPES) ×2 IMPLANT
DRAPE SURG ORHT 6 SPLT 77X108 (DRAPES) ×1 IMPLANT
DRAPE U-SHAPE 47X51 STRL (DRAPES) ×2 IMPLANT
DRSG ADAPTIC 3X8 NADH LF (GAUZE/BANDAGES/DRESSINGS) ×2 IMPLANT
ELECT REM PT RETURN 9FT ADLT (ELECTROSURGICAL) ×2
ELECTRODE REM PT RTRN 9FT ADLT (ELECTROSURGICAL) ×1 IMPLANT
EVACUATOR 1/8 PVC DRAIN (DRAIN) IMPLANT
GAUZE SPONGE 4X4 12PLY STRL (GAUZE/BANDAGES/DRESSINGS) ×2 IMPLANT
GAUZE SPONGE 4X4 12PLY STRL LF (GAUZE/BANDAGES/DRESSINGS) ×2 IMPLANT
GLOVE BIO SURGEON STRL SZ7.5 (GLOVE) ×8 IMPLANT
GLOVE BIOGEL PI IND STRL 7.5 (GLOVE) ×1 IMPLANT
GLOVE BIOGEL PI INDICATOR 7.5 (GLOVE) ×1
GOWN STRL REUS W/ TWL LRG LVL3 (GOWN DISPOSABLE) ×2 IMPLANT
GOWN STRL REUS W/TWL LRG LVL3 (GOWN DISPOSABLE) ×2
HANDPIECE INTERPULSE COAX TIP (DISPOSABLE) ×1
IV NS IRRIG 3000ML ARTHROMATIC (IV SOLUTION) ×2 IMPLANT
KIT BASIN OR (CUSTOM PROCEDURE TRAY) ×2 IMPLANT
KIT ROOM TURNOVER OR (KITS) ×2 IMPLANT
MANIFOLD NEPTUNE II (INSTRUMENTS) ×2 IMPLANT
NS IRRIG 1000ML POUR BTL (IV SOLUTION) ×2 IMPLANT
PACK ORTHO EXTREMITY (CUSTOM PROCEDURE TRAY) ×2 IMPLANT
PAD ABD 8X10 STRL (GAUZE/BANDAGES/DRESSINGS) ×2 IMPLANT
PAD ARMBOARD 7.5X6 YLW CONV (MISCELLANEOUS) ×4 IMPLANT
PADDING CAST COTTON 6X4 STRL (CAST SUPPLIES) ×2 IMPLANT
SET HNDPC FAN SPRY TIP SCT (DISPOSABLE) ×1 IMPLANT
SPONGE LAP 18X18 X RAY DECT (DISPOSABLE) ×2 IMPLANT
SUT ETHILON 2 0 FS 18 (SUTURE) ×2 IMPLANT
SUT ETHILON 3 0 PS 1 (SUTURE) ×2 IMPLANT
SUT MON AB 2-0 CT1 36 (SUTURE) ×2 IMPLANT
SUT PDS AB 0 CT 36 (SUTURE) IMPLANT
SWAB CULTURE ESWAB REG 1ML (MISCELLANEOUS) ×2 IMPLANT
TOWEL OR 17X24 6PK STRL BLUE (TOWEL DISPOSABLE) ×2 IMPLANT
TOWEL OR 17X26 10 PK STRL BLUE (TOWEL DISPOSABLE) ×4 IMPLANT
TUBE CONNECTING 12X1/4 (SUCTIONS) ×2 IMPLANT
UNDERPAD 30X30 (UNDERPADS AND DIAPERS) ×2 IMPLANT
WATER STERILE IRR 1000ML POUR (IV SOLUTION) IMPLANT
YANKAUER SUCT BULB TIP NO VENT (SUCTIONS) ×2 IMPLANT

## 2017-08-06 NOTE — Anesthesia Postprocedure Evaluation (Signed)
Anesthesia Post Note  Patient: Linda [name]  Procedure(s) Performed: IRRIGATION AND DEBRIDEMENT KNEE (Right )     Patient location during evaluation: PACU Anesthesia Type: General Level of consciousness: awake and alert Pain management: pain level controlled Vital Signs Assessment: post-procedure vital signs reviewed and stable Respiratory status: spontaneous breathing, nonlabored ventilation, respiratory function stable and patient connected to nasal cannula oxygen Cardiovascular status: blood pressure returned to baseline and stable Postop Assessment: no apparent nausea or vomiting Anesthetic complications: no    Last Vitals:  Vitals:   08/06/17 0910 08/06/17 0915  BP: (!) 154/109   Pulse: 93 87  Resp: (!) 27 15  Temp:  (!) 36.1 C  SpO2: (!) 84% 99%    Last Pain:  Vitals:   08/06/17 0903  TempSrc:   PainSc: Asleep                 HODIERNE,ADAM S

## 2017-08-06 NOTE — Telephone Encounter (Signed)
Pt called back in tears and could barely talk. Pt keeps asking to see Dr Mingo Amber so he can give her pain meds Please advise

## 2017-08-06 NOTE — Progress Notes (Signed)
Family Medicine Teaching Service Daily Progress Note Intern Pager: [phone]  Patient name: Linda [name] Medical record number: [id number] Date of birth: [birth year] Age: 21 y.o. Gender: female  Primary Care Provider: Tonette Bihari, MD Consultants: none Code Status: Full  Pt Overview and Major Events to Date:  12/03: Admitted for R knee swelling and found to have cellulitis  Assessment and Plan: Linda [name] is a 21 y.o. female presenting with right knee pain and swelling. PMH is significant for lupus, connective tissue disease, HTN, obesity.  Prepatellar septic bursitis of right knee/ cellulitis: S/P I&D 12/7. Afebrile.Leukocytosis to 12.5> 16.5> 15.1.   -Scheduled Tylenol.  OxyIR as needed for severe pain.   --IV vein for septic bursitis- will transition to oral doxycycline for 14 days prior to discharge --Weightbearing as tolerated to right lower extremity  --Blood cultures NGx3d -Orthopedics is following, appreciate recommendations- s/p I&D-continue with antibiotics and dressing change postop day 2 or 3 - Will add dilaudid 1 mg q3 prn severe pain as patient is hurting after surgery  Right upper arm pain and swelling: Improving.  Stable.   - Full range of motion. --Monitor  HTN:  Elevated BP likely due to poorly controlled HTN from noncompliance with medications.  Pain also likely a factor.  - Scheduled Tylenol for pain, with OxyIR as needed for breakthrough pain -On home amlodipine 10 mg daily. -Discontinued HCTZ 25 mg daily and lisinopril 20 mg daily  Leukocytosis: CBC unable to be collected this a.m. WBC 12.5> 16.5>15.1.  -Continue to monitor  History of connective tissue disease and lupus: Likely contributing to knee pain and swelling - ESR 21and CRP 12.7 -Patient has F/U with rheumatology at [facility] in February, previously missed appointment  FEN/GI: Regular diet Prophylaxis: Lovenox  Disposition: Home  Subjective:  Patient is crying when I enter  the room.  Reports that she had a lot of pain after surgery.  She reports it is similar to the pain that she had before.  Pain starts in her right knee and radiates up her thigh.  She had Dilaudid in PACU but reports that this did not help.  She says she did not receive multiple doses of Dilaudid although it is in the med rec.    Objective: Temp:  [97 F (36.1 C)-100.1 F (37.8 C)] 97 F (36.1 C) (12/07 0915) Pulse Rate:  [39-208] 87 (12/07 0915) Resp:  [13-27] 15 (12/07 0915) BP: (128-169)/(77-133) 128/101 (12/07 0959) SpO2:  [80 %-100 %] 99 % (12/07 0915) Physical Exam: General: MAD, crying in bed Neck: Supple, no LAD Cardiovascular: RRR, no m/r/g, no LE edema Respiratory: CTA BL, normal work of breathing Gastrointestinal: soft, nontender, nondistended MSK: Moves upper extremities spontaneously.  Right knee is wrapped in Ace bandages.  Full range of motion of left knee.  Able to move toes bilaterally spontaneously.  Sensation intact in bilateral lower extremities. Derm: Diffuse circumferential areas of hyperpigmentation and crusting involving all surfaces of the skin Psych: AO and in distress  Laboratory: Recent Labs  Lab 08/04/17 0416 08/05/17 1310 08/06/17 0257  WBC 15.1* 11.9* 11.5*  HGB 12.3 11.5* 11.9*  HCT 38.3 36.0 36.7  PLT 156 173 207   Recent Labs  Lab 08/04/17 0416 08/05/17 1310 08/06/17 0821  NA 132* 136 136  K 3.8 3.7 3.8  CL 99* 102 100*  CO2 19* 26 25  BUN 9 <5* <5*  CREATININE 0.84 0.81 0.78  CALCIUM 8.6* 8.8* 9.2  GLUCOSE 92 85 88   CRP  12.7 ESR 21  Imaging/Diagnostic Tests: Dg Knee 2 Views Right  Result Date: 08/02/2017 CLINICAL DATA:  Right knee swelling and drainage. History of mixed connective tissue disease. EXAM: RIGHT KNEE - 1-2 VIEW COMPARISON:  Radiographs 04/09/2017.  MRI 04/21/2017. FINDINGS: The mineralization and alignment are normal. There is no evidence of acute fracture or dislocation. The joint spaces are maintained. Extensive  soft tissue calcifications are again noted anteriorly, similar to previous studies. No significant joint effusion identified. IMPRESSION: Stable radiographic appearance of the right knee with extensive soft tissue calcifications consistent with history of mixed connective tissue disease. No acute osseous findings. Electronically Signed   By: Richardean Sale M.D.   On: 08/02/2017 14:54   Mr Humerus Right W Wo Contrast  Result Date: 08/02/2017 CLINICAL DATA:  Having significant pain to the right upper arm with swelling. She has pain superficially to the skin. EXAM: MRI OF THE RIGHT HUMERUS WITHOUT AND WITH CONTRAST TECHNIQUE: Multiplanar, multisequence MR imaging of the right humerus was performed before and after the administration of intravenous contrast. CONTRAST:  33m MULTIHANCE GADOBENATE DIMEGLUMINE 529 MG/ML IV SOLN COMPARISON:  None. FINDINGS: Bones/Joint/Cartilage No fracture or dislocation. Normal alignment. No joint effusion. No periosteal reaction or bone destruction. No aggressive osseous lesion. Ligaments Ligaments are suboptimally evaluated by CT. Muscles and Tendons Mild edema and enhancement in the proximal short head of the biceps muscle at the muscular tendinous junction which may reflect muscle strain versus mild myositis secondary to an infectious or inflammatory etiology. Muscles are otherwise normal in signal. No intramuscular fluid collection or hematoma. Supraspinatus tendon is intact. Infraspinatus tendon is intact. Teres minor tendon is intact. Subscapularis tendon is intact. Soft tissue No soft tissue edema within the subcutaneous fat. No fluid collection or hematoma. No soft tissue mass. IMPRESSION: 1. Mild edema and enhancement in the proximal short head of the biceps muscle at the muscular tendinous junction which may reflect muscle strain versus mild myositis secondary to an infectious or inflammatory etiology. 2. No MRI evidence of cellulitis. No drainable fluid collection to  suggest an abscess. Electronically Signed   By: HKathreen Devoid  On: 08/02/2017 21:57   Mr Knee Right W Wo Contrast  Result Date: 08/02/2017 CLINICAL DATA:  21yo F with Lupus/mixed connective tissue disease with a chief complaint of recurrent cellulitis. This has been going on for the past few days. Having significant pain to the right knee with swelling. She has pain superficially to the skin. EXAM: MRI OF THE RIGHT KNEE WITHOUT AND WITH CONTRAST TECHNIQUE: Multiplanar, multisequence MR imaging of the right knee was performed both before and after administration of intravenous contrast. CONTRAST:  253mMULTIHANCE GADOBENATE DIMEGLUMINE 529 MG/ML IV SOLN COMPARISON:  None. FINDINGS: MENISCI Medial meniscus:  Intact. Lateral meniscus:  Intact. LIGAMENTS Cruciates:  Intact ACL and PCL. Collaterals: Medial collateral ligament is intact. Lateral collateral ligament complex is intact. CARTILAGE Patellofemoral:  No chondral defect. Medial:  No chondral defect. Lateral:  No chondral defect. Joint: No joint effusion. Mild edema in Hoffa's fat. No plical thickening. Small amount of fluid in the deep infrapatellar bursa. Popliteal Fossa:  No Baker cyst.  Intact popliteus tendon. Extensor Mechanism: Intact quadriceps tendon and patellar tendon. Intact medial and lateral patellar retinaculum. Intact MPFL. Bones: No marrow signal abnormality. No fracture or dislocation. No aggressive osseous lesion. No periosteal reaction or bone destruction. Soft tissue: Dystrophic calcification within the subcutaneous fat anterior to the distal quadriceps tendon, patella and patellar tendon consistent with the patient's  history of connective tissue disease. Soft tissue edema in the subcutaneous fat along the anterior aspect of the knee with enhancement on postcontrast imaging most concerning for cellulitis. No focal fluid collection to suggest an abscess. IMPRESSION: 1. No internal derangement of the right knee. 2. Cellulitis along the  anterior aspect of the knee. No drainable fluid collection to suggest a hematoma. 3. Small amount of fluid in the deep infrapatellar bursa consistent with bursitis with mild adjacent soft tissue edema in Hoffa's fat. 4. No significant joint effusion to suggest septic arthritis. Electronically Signed   By: Kathreen Devoid   On: 08/02/2017 22:02    Shirley, [place], DO 08/06/2017, 1:59 PM PGY-1, [place] Intern pager: [phone], text pages welcome

## 2017-08-06 NOTE — Progress Notes (Signed)
Pharmacy Antibiotic Note  Linda [name] is a 21 y.o. female on day # 4 Vancomycin for infrapatellar bursitis.  s/p I&D today. Renal function stable.  Tmax 100.1, WBC 11.5. Rare GPC in gram stain from OR.  Plan:  Continue Vancomycin 1 gm IV q8hrs.  Vanc trough level prior to 6am dose on 12/8.  Vanc trough goal 15-20 mcg/ml  Height: 5\' 6"  (167.6 cm) Weight: 226 lb 6.6 oz (102.7 kg) IBW/kg (Calculated) : 59.3  Temp (24hrs), Avg:98.6 F (37 C), Min:97 F (36.1 C), Max:100.1 F (37.8 C)  Recent Labs  Lab 08/02/17 1325 08/03/17 0428 08/03/17 0552 08/04/17 0416 08/05/17 1310 08/06/17 0257 08/06/17 0821  WBC 12.5* 16.5*  --  15.1* 11.9* 11.5*  --   CREATININE 0.88  --  0.79 0.84 0.81  --  0.78    Estimated Creatinine Clearance: 133.6 mL/min (by C-G formula based on SCr of 0.78 mg/dL).    Allergies  Allergen Reactions  . Vicodin [Hydrocodone-Acetaminophen] Itching and Rash    Antimicrobials this admission:  Doxycycline PO x 1 on 12/4  Vancomycin 12/4>>  Dose adjustments this admission:  n/a  Microbiology results: 12/3 blood x 2 - ng x 3 days so far 12/7 right knee abscess - rare GPC on gram stain - pending 12/6 MRSA PCR negative  Thank you for allowing pharmacy to be a part of this patient's care.  Arty Baumgartner, [place] Pager: [phone] 08/06/2017 4:34 PM

## 2017-08-06 NOTE — Anesthesia Procedure Notes (Signed)
Procedure Name: LMA Insertion Date/Time: 08/06/2017 7:38 AM Performed by: Colin Benton, CRNA Pre-anesthesia Checklist: Patient identified, Emergency Drugs available, Suction available and Patient being monitored Patient Re-evaluated:Patient Re-evaluated prior to induction Oxygen Delivery Method: Circle system utilized Preoxygenation: Pre-oxygenation with 100% oxygen Induction Type: IV induction Ventilation: Mask ventilation without difficulty LMA: LMA inserted LMA Size: 4.0 Number of attempts: 1 Placement Confirmation: positive ETCO2 and breath sounds checked- equal and bilateral Tube secured with: Tape Dental Injury: Teeth and Oropharynx as per pre-operative assessment

## 2017-08-06 NOTE — Progress Notes (Signed)
Pt is complaining of extreme pain in her right knee post I&D. Pain medication has already been given. MD made aware.

## 2017-08-06 NOTE — Transfer of Care (Signed)
Immediate Anesthesia Transfer of Care Note  Patient: Linda [name]  Procedure(s) Performed: IRRIGATION AND DEBRIDEMENT KNEE (Right )  Patient Location: PACU  Anesthesia Type:General  Level of Consciousness: awake, alert , oriented and patient cooperative  Airway & Oxygen Therapy: Patient Spontanous Breathing and Patient connected to face mask oxygen  Post-op Assessment: Report given to RN and Post -op Vital signs reviewed and stable  Post vital signs: Reviewed and stable  Last Vitals:  Vitals:   08/06/17 0529 08/06/17 0816  BP: 133/87   Pulse: (!) 105   Resp: 18   Temp: 37.8 C (P) 36.5 C  SpO2: 100%     Last Pain:  Vitals:   08/06/17 0529  TempSrc: Oral  PainSc:       Patients Stated Pain Goal: 0 (03/12/51 4799)  Complications: No apparent anesthesia complications

## 2017-08-06 NOTE — Progress Notes (Signed)
SPoke with Dr Marcie Bal concerning pt and pain and sats, oked to go back to room

## 2017-08-06 NOTE — Op Note (Signed)
OrthopaedicSurgeryOperativeNote (GDJ:[id number]) Date of Surgery: 08/06/2017  Admit Date: 08/02/2017   Diagnoses: Pre-Op Diagnoses: Right prepatellar septic bursitis   Post-Op Diagnosis: Same  Procedures: [id number]-I&D of right prepatellar bursitis  Surgeons: Primary: Haddix, Thomasene Lot, MD   Location:[facility] OR ROOM 03   Anesthesia: MAC  Antibiotics:None  Tourniquettime:None  [id number]:81 mL   Complications:None  Specimens: ID Type Source Tests Collected by Time Destination  A : right knee abscess Abscess Abscess GRAM STAIN, AEROBIC/ANAEROBIC CULTURE (SURGICAL/DEEP WOUND) Haddix, Thomasene Lot, MD 08/06/2017 0754     Implants: None  IndicationsforSurgery: This is a 21 year old female who has a history of a mixed connective tissue disorder.  She had pain and swelling about the anterior knee for approximately 1 week.  She developed acute onset drainage from the knee.  X-rays and MRI were obtained with MRI showed no intra-articular involvement x-rays showed noticeable calcifications around the quad and patellar tendon.  Exam she had significant erythema.  There is a mild amount of fluctuance over the area that was draining.  Appeared to be talking and purulent in nature.  I discussed the risks and benefits of proceeding with a surgical I&D. Discussed the risks and benefits of nonoperative versus operative management.  I feel like her knee joint is not involved and she does not need an aspiration.  I feel that this is likely secondary to septic bursitis on chronic dystrophic calcification.  I feel that an incision and drainage would be indicated to prevent continued drainage and possible superinfection of the area.  I also feel that it would likely provide benefit for the patient in terms of pain control.  Benefits and risks which include bleeding, infection, nonhealing wound, continued knee pain, septic arthritis, the patient wishes to proceed with I&D  Operative  Findings: 1. Mixture of purulence and chalky material sent to labs, irrigation and debridement of prepatellar bursitis.  Procedure: The patient was identified in the preoperative holding area. Consent was confirmed with the patient and their family and all questions were answered. The operative extremity was marked after confirmation with the patient. she was then brought back to the operating room by our anesthesia colleagues.  She was carefully transferred over to my regular OR table.  She was placed under anesthesia. The operative extremity was then prepped and draped in usual sterile fashion. A preoperative timeout was performed to verify the patient, the procedure, and the extremity.  Over the area of drainage I extended the incision proximally and distally.  The subcutaneous tissue there is a significant amount of calcifications.  There was some active drainage that I sent for culture.  I then using Metzenbaum scissors to enter the prepatellar bursa.  It was relatively benign with no active purulence that I can see.  I then proceeded to use a rongeur to debride the calcifications were present.  I then used a low pressure pulsatile lavage to irrigate the wound.  I then proceeded to close the incision with 2-0 Monocryl and 2-0 nylon.  Sterile dressing consisting of Adaptic, bacitracin ointment, 4 x 4's, ABD pad and Ace wrap was placed.  The patient was awoken from anesthesia and taken to the PACU in stable condition.  Post Op Plan/Instructions: The patient will be weightbearing as tolerated to the right lower extremity.  She will continue with antibiotics.  DVT prophylaxis at the discretion of the primary team.  Will likely change her dressing postoperative day 2 or 3.  I was present and performed the entire surgery.  Lennette Bihari Haddix,  MD [facility]

## 2017-08-06 NOTE — Progress Notes (Signed)
Pt continues to state she is in pain ,sats 87 , will not cooperate in deep breathing and coughing, states she was 10 all night and nothing works, pt states she wants to go see family

## 2017-08-06 NOTE — Telephone Encounter (Signed)
Pt is at hospital  [address].  She just got out of surgery and is in a lot of pain.  She was receiveing dilaud before the surgery but now is only getting 5mg  of hydrocodone. She is asking for a dr from family practice to come see her and give her pain meds.

## 2017-08-06 NOTE — Progress Notes (Signed)
OT Cancellation    08/06/17 0700  OT Visit Information  Last OT Received On 08/06/17  Reason Eval/Treat Not Completed Patient at procedure or test/ unavailable (OR)  [name], OT/L  [phone] 08/06/2017

## 2017-08-06 NOTE — Anesthesia Preprocedure Evaluation (Signed)
Anesthesia Evaluation  Patient identified by MRN, date of birth, ID band Patient awake    Reviewed: Allergy & Precautions, H&P , NPO status , Patient's Chart, lab work & pertinent test results  Airway Mallampati: II   Neck ROM: full    Dental   Pulmonary Current Smoker,    breath sounds clear to auscultation       Cardiovascular hypertension,  Rhythm:regular Rate:Normal     Neuro/Psych  Headaches,    GI/Hepatic   Endo/Other  obese  Renal/GU      Musculoskeletal   Abdominal   Peds  Hematology   Anesthesia Other Findings   Reproductive/Obstetrics                             Anesthesia Physical Anesthesia Plan  ASA: II  Anesthesia Plan: General   Post-op Pain Management:    Induction: Intravenous  PONV Risk Score and Plan: 2 and Ondansetron, Dexamethasone, Midazolam and Treatment may vary due to age or medical condition  Airway Management Planned: LMA  Additional Equipment:   Intra-op Plan:   Post-operative Plan:   Informed Consent: I have reviewed the patients History and Physical, chart, labs and discussed the procedure including the risks, benefits and alternatives for the proposed anesthesia with the patient or authorized representative who has indicated his/her understanding and acceptance.     Plan Discussed with: CRNA, Anesthesiologist and Surgeon  Anesthesia Plan Comments:         Anesthesia Quick Evaluation

## 2017-08-06 NOTE — Interval H&P Note (Signed)
History and Physical Interval Note:  08/06/2017 7:12 AM  Linda [name]  has presented today for surgery, with the diagnosis of right knee infection  The various methods of treatment have been discussed with the patient and family. After consideration of risks, benefits and other options for treatment, the patient has consented to  Procedure(s): IRRIGATION AND DEBRIDEMENT KNEE (Right) as a surgical intervention .  The patient's history has been reviewed, patient examined, no change in status, stable for surgery.  I have reviewed the patient's chart and labs.  Questions were answered to the patient's satisfaction.     Haddix, Thomasene Lot

## 2017-08-06 NOTE — Progress Notes (Signed)
Dr Gifford Shave at bedside, states pt has had enoughj pain medicine, oxygen at 2 liters added and sats 93

## 2017-08-07 ENCOUNTER — Encounter (HOSPITAL_COMMUNITY): Payer: Self-pay | Admitting: Student

## 2017-08-07 DIAGNOSIS — I10 Essential (primary) hypertension: Secondary | ICD-10-CM

## 2017-08-07 LAB — CBC
HCT: 35.1 % — ABNORMAL LOW (ref 36.0–46.0)
Hemoglobin: 11.4 g/dL — ABNORMAL LOW (ref 12.0–15.0)
MCH: 30.9 pg (ref 26.0–34.0)
MCHC: 32.5 g/dL (ref 30.0–36.0)
MCV: 95.1 fL (ref 78.0–100.0)
Platelets: 263 10*3/uL (ref 150–400)
RBC: 3.69 MIL/uL — ABNORMAL LOW (ref 3.87–5.11)
RDW: 13.4 % (ref 11.5–15.5)
WBC: 18.9 10*3/uL — ABNORMAL HIGH (ref 4.0–10.5)

## 2017-08-07 LAB — VANCOMYCIN, TROUGH: Vancomycin Tr: 16 ug/mL (ref 15–20)

## 2017-08-07 MED ORDER — DOXYCYCLINE HYCLATE 100 MG PO TABS
100.0000 mg | ORAL_TABLET | Freq: Two times a day (BID) | ORAL | Status: DC
  Administered 2017-08-08 – 2017-08-09 (×4): 100 mg via ORAL
  Filled 2017-08-07 (×4): qty 1

## 2017-08-07 NOTE — Plan of Care (Signed)
Pt progressing

## 2017-08-07 NOTE — Progress Notes (Signed)
Family Medicine Teaching Service Daily Progress Note Intern Pager: [phone]  Patient name: Linda [name] Medical record number: [id number] Date of birth: 1996/06/17 Age: 21 y.o. Gender: female  Primary Care Provider: Tonette Bihari, MD Consultants: Orthopedics Code Status: Full  Pt Overview and Major Events to Date:  12/03: admitted for R knee swelling and found to have septic bursitis 12/07: drainage of right knee performed by ortho  Assessment and Plan: Linda [name] is a 21 y.o. female presenting with right knee pain and swelling. PMH is significant for lupus, connective tissue disease, HTN, obesity.  Prepatellar septic bursitis of right knee: S/P I&D, day 2.  Remains afebrile without tachycardia or tachypnea.  Leukocytosis persists at 18.9.  Blood cultures remain negative.  Hemoglobin stable.  Pain seems to be suboptimally controlled.  Continues to receive doxycycline since discontinuation of vancomycin as of 12/08.  Orthopedics recommending changing knee wrap in the next 24-48 hours.  No signs of DVT or compartment syndrome. - Encourage patient to ambulate and weight-bear as tolerated - Continue doxycycline for completion of 14 days of antibiotics - Trend fever curve and monitor finalized blood culture - Orthopedics consulted, appreciate recommendations, will change wrap as instructed and follow-up outpatient - Continue OxyIR 5-10 mg every 4 hours as needed, scheduled tylenol every 6 hours - PT/OT recommending rolling walker  Right upper arm pain and swelling: Acute.  Resolved.  Has full range of motion. - Continue monitoring  Hypertension: Chronic.  Uncontrolled due to nonadherence to medications.  Exacerbated likely due to increased pain of right knee. - Continue home amlodipine 10 mg daily - Holding home HCTZ 25 mg daily and lisinopril 20 mg daily - Pain control per above  Leukocytosis: Acute. Worsening. Likely due resolving septic bursitis. Remains afebrile  without signs of infection at incision site. No signs of UTI, PNA, pressure injuries.   Multiple connective tissue disease and lupus: Chronic.  Suboptimal control.  Not currently on DMARD's or steroids at home.  Scheduled to follow-up with new rheumatologist in February.  Previously seen by [facility] rheumatologist discontinue steroids back in August. - Follow-up rheumatology outpatient as scheduled  FEN/GI: Regular diet, Miralax daily, KVO Prophylaxis: Lovenox SQ  Disposition: Pending pain control S/P right knee drainage. Will anticipate d/c home likely in next 24-48 hours.  Subjective:  Patient states her right leg remains painful overnight.  She does not feel like the oxycodone is helping much.  She has not tried ambulating since the procedure.  She would like to get off IV medications.  She denies any feelings of fevers or chills, nausea or vomiting, chest pain, shortness of breath, abdominal pain, dysuria.  Objective: Temp:  [97.7 F (36.5 C)-98.3 F (36.8 C)] 98.3 F (36.8 C) (12/08 1478) Pulse Rate:  [84-89] 85 (12/08 0633) Resp:  [18] 18 (12/08 2956) BP: (140-150)/(93-98) 150/98 (12/08 0633) SpO2:  [99 %-100 %] 99 % (12/08 2130) Physical Exam: General: obese, mild distress with movement of right leg with non-toxic appearance HEENT: normocephalic, atraumatic, moist mucous membranes Cardiovascular: regular rate and rhythm without murmurs, rubs, or gallops Lungs: clear to auscultation bilaterally with normal work of breathing Abdomen: soft, non-tender, non-distended, normoactive bowel sounds Skin: warm, dry, no rashes or lesions, cap refill < 2 seconds Extremities: warm and well perfused, normal tone, dry wrap on right knee without edema or erythema, pedal pulse 2+ bilaterally Neuro: grossly intact throughout, moves all 4 extremities, no decreased sensation distal to right knee  Laboratory: Recent Labs  Lab 08/05/17 1310  08/06/17 0257 08/07/17 0522  WBC 11.9* 11.5* 18.9*   HGB 11.5* 11.9* 11.4*  HCT 36.0 36.7 35.1*  PLT 173 207 263   Recent Labs  Lab 08/04/17 0416 08/05/17 1310 08/06/17 0821  NA 132* 136 136  K 3.8 3.7 3.8  CL 99* 102 100*  CO2 19* 26 25  BUN 9 <5* <5*  CREATININE 0.84 0.81 0.78  CALCIUM 8.6* 8.8* 9.2  GLUCOSE 92 85 88   CRP 12.7 ESR 21  Imaging/Diagnostic Tests: Dg Knee 2 Views Right  Result Date: 08/02/2017 CLINICAL DATA:  Right knee swelling and drainage. History of mixed connective tissue disease. EXAM: RIGHT KNEE - 1-2 VIEW COMPARISON:  Radiographs 04/09/2017.  MRI 04/21/2017. FINDINGS: The mineralization and alignment are normal. There is no evidence of acute fracture or dislocation. The joint spaces are maintained. Extensive soft tissue calcifications are again noted anteriorly, similar to previous studies. No significant joint effusion identified. IMPRESSION: Stable radiographic appearance of the right knee with extensive soft tissue calcifications consistent with history of mixed connective tissue disease. No acute osseous findings. Electronically Signed   By: Richardean Sale M.D.   On: 08/02/2017 14:54   Mr Humerus Right W Wo Contrast  Result Date: 08/02/2017 CLINICAL DATA:  Having significant pain to the right upper arm with swelling. She has pain superficially to the skin. EXAM: MRI OF THE RIGHT HUMERUS WITHOUT AND WITH CONTRAST TECHNIQUE: Multiplanar, multisequence MR imaging of the right humerus was performed before and after the administration of intravenous contrast. CONTRAST:  74m MULTIHANCE GADOBENATE DIMEGLUMINE 529 MG/ML IV SOLN COMPARISON:  None. FINDINGS: Bones/Joint/Cartilage No fracture or dislocation. Normal alignment. No joint effusion. No periosteal reaction or bone destruction. No aggressive osseous lesion. Ligaments Ligaments are suboptimally evaluated by CT. Muscles and Tendons Mild edema and enhancement in the proximal short head of the biceps muscle at the muscular tendinous junction which may reflect  muscle strain versus mild myositis secondary to an infectious or inflammatory etiology. Muscles are otherwise normal in signal. No intramuscular fluid collection or hematoma. Supraspinatus tendon is intact. Infraspinatus tendon is intact. Teres minor tendon is intact. Subscapularis tendon is intact. Soft tissue No soft tissue edema within the subcutaneous fat. No fluid collection or hematoma. No soft tissue mass. IMPRESSION: 1. Mild edema and enhancement in the proximal short head of the biceps muscle at the muscular tendinous junction which may reflect muscle strain versus mild myositis secondary to an infectious or inflammatory etiology. 2. No MRI evidence of cellulitis. No drainable fluid collection to suggest an abscess. Electronically Signed   By: HKathreen Devoid  On: 08/02/2017 21:57   Mr Knee Right W Wo Contrast  Result Date: 08/02/2017 CLINICAL DATA:  21yo F with Lupus/mixed connective tissue disease with a chief complaint of recurrent cellulitis. This has been going on for the past few days. Having significant pain to the right knee with swelling. She has pain superficially to the skin. EXAM: MRI OF THE RIGHT KNEE WITHOUT AND WITH CONTRAST TECHNIQUE: Multiplanar, multisequence MR imaging of the right knee was performed both before and after administration of intravenous contrast. CONTRAST:  254mMULTIHANCE GADOBENATE DIMEGLUMINE 529 MG/ML IV SOLN COMPARISON:  None. FINDINGS: MENISCI Medial meniscus:  Intact. Lateral meniscus:  Intact. LIGAMENTS Cruciates:  Intact ACL and PCL. Collaterals: Medial collateral ligament is intact. Lateral collateral ligament complex is intact. CARTILAGE Patellofemoral:  No chondral defect. Medial:  No chondral defect. Lateral:  No chondral defect. Joint: No joint effusion. Mild edema in Hoffa's fat.  No plical thickening. Small amount of fluid in the deep infrapatellar bursa. Popliteal Fossa:  No Baker cyst.  Intact popliteus tendon. Extensor Mechanism: Intact quadriceps  tendon and patellar tendon. Intact medial and lateral patellar retinaculum. Intact MPFL. Bones: No marrow signal abnormality. No fracture or dislocation. No aggressive osseous lesion. No periosteal reaction or bone destruction. Soft tissue: Dystrophic calcification within the subcutaneous fat anterior to the distal quadriceps tendon, patella and patellar tendon consistent with the patient's history of connective tissue disease. Soft tissue edema in the subcutaneous fat along the anterior aspect of the knee with enhancement on postcontrast imaging most concerning for cellulitis. No focal fluid collection to suggest an abscess. IMPRESSION: 1. No internal derangement of the right knee. 2. Cellulitis along the anterior aspect of the knee. No drainable fluid collection to suggest a hematoma. 3. Small amount of fluid in the deep infrapatellar bursa consistent with bursitis with mild adjacent soft tissue edema in Hoffa's fat. 4. No significant joint effusion to suggest septic arthritis. Electronically Signed   By: Kathreen Devoid   On: 08/02/2017 22:02    Kirkwood Bing, DO 08/07/2017, 10:05 AM PGY-2, [place] Intern pager: [phone], text pages welcome

## 2017-08-07 NOTE — Progress Notes (Signed)
Physical Therapy Treatment Patient Details Name: Linda [name] MRN: [id number] DOB: 05/29/1996 Today's Date: 08/07/2017    History of Present Illness 21 yo female with known connective tissue disease and multiple admissions for right knee effusion presumed to be autoimmune in etiology who presents today with the same.  States she has been in pain for past few weeks, but worse past several days. Imaging on 08/02/17 of R knee showing Cellulitis along the anterior aspect of the knee and R wrist showing no acute fracture or dislocation.    PT Comments    Pt making slow gains.  Emphasis on gait and w/bearing activity.  Pt deferred work on bending knee.    Follow Up Recommendations  Supervision for mobility/OOB;Home health PT     Equipment Recommendations  Rolling walker with 5" wheels    Recommendations for Other Services       Precautions / Restrictions Precautions Precautions: Fall    Mobility  Bed Mobility Overal bed mobility: Needs Assistance Bed Mobility: Supine to Sit;Sit to Supine     Supine to sit: Min guard Sit to supine: Min assist   General bed mobility comments: Min A to manage RLE  Transfers Overall transfer level: Needs assistance Equipment used: Rolling walker (2 wheeled) Transfers: Sit to/from Stand Sit to Stand: Min guard         General transfer comment: cues for hand placement and technique  Ambulation/Gait Ambulation/Gait assistance: Min guard Ambulation Distance (Feet): 60 Feet Assistive device: Rolling walker (2 wheeled) Gait Pattern/deviations: Step-to pattern;Decreased stance time - right;Decreased weight shift to right     General Gait Details: slow step to gait pattern with increasingly more weight added to R LE in stance.  Otherwise steady with RW   Stairs            Wheelchair Mobility    Modified Rankin (Stroke Patients Only)       Balance Overall balance assessment: Needs assistance   Sitting balance-Leahy Scale:  Good     Standing balance support: No upper extremity supported Standing balance-Leahy Scale: Fair                              Cognition     Overall Cognitive Status: Within Functional Limits for tasks assessed                                        Exercises      General Comments        Pertinent Vitals/Pain Faces Pain Scale: Hurts worst Pain Location: R knee Pain Descriptors / Indicators: Throbbing;Sore Pain Intervention(s): Monitored during session;Limited activity within patient's tolerance    Home Living                      Prior Function            PT Goals (current goals can now be found in the care plan section) Acute Rehab PT Goals Time For Goal Achievement: 08/11/17 Potential to Achieve Goals: Good    Frequency    Min 3X/week      PT Plan Current plan remains appropriate    Co-evaluation              AM-PAC PT "6 Clicks" Daily Activity  Outcome Measure  Difficulty turning over in bed (including adjusting bedclothes, sheets  and blankets)?: None Difficulty moving from lying on back to sitting on the side of the bed? : A Lot Difficulty sitting down on and standing up from a chair with arms (e.g., wheelchair, bedside commode, etc,.)?: A Little Help needed moving to and from a bed to chair (including a wheelchair)?: A Little Help needed walking in hospital room?: A Little Help needed climbing 3-5 steps with a railing? : A Lot 6 Click Score: 17    End of Session   Activity Tolerance: Patient tolerated treatment well Patient left: in bed;with call bell/phone within reach;with family/visitor present Nurse Communication: Mobility status PT Visit Diagnosis: Difficulty in walking, not elsewhere classified (R26.2);Pain Pain - Right/Left: Right Pain - part of body: Knee     Time: 5747-3403 PT Time Calculation (min) (ACUTE ONLY): 36 min  Charges:  $Gait Training: 8-22 mins $Therapeutic Activity: 8-22  mins                    G Codes:       08/17/17  Donnella Sham, PT [phone]  (pager)   Tessie Fass [place] 08-17-17, 3:45 PM

## 2017-08-08 ENCOUNTER — Other Ambulatory Visit: Payer: Self-pay | Admitting: Internal Medicine

## 2017-08-08 DIAGNOSIS — M00061 Staphylococcal arthritis, right knee: Secondary | ICD-10-CM

## 2017-08-08 LAB — CBC
HCT: 34.4 % — ABNORMAL LOW (ref 36.0–46.0)
Hemoglobin: 11.1 g/dL — ABNORMAL LOW (ref 12.0–15.0)
MCH: 31.1 pg (ref 26.0–34.0)
MCHC: 32.3 g/dL (ref 30.0–36.0)
MCV: 96.4 fL (ref 78.0–100.0)
Platelets: 273 10*3/uL (ref 150–400)
RBC: 3.57 MIL/uL — ABNORMAL LOW (ref 3.87–5.11)
RDW: 13.5 % (ref 11.5–15.5)
WBC: 17.3 10*3/uL — ABNORMAL HIGH (ref 4.0–10.5)

## 2017-08-08 LAB — CULTURE, BLOOD (ROUTINE X 2)
Culture: NO GROWTH
Culture: NO GROWTH
Special Requests: ADEQUATE
Special Requests: ADEQUATE

## 2017-08-08 MED ORDER — HYDROCHLOROTHIAZIDE 25 MG PO TABS
25.0000 mg | ORAL_TABLET | Freq: Every day | ORAL | Status: DC
  Administered 2017-08-08 – 2017-08-09 (×2): 25 mg via ORAL
  Filled 2017-08-08 (×2): qty 1

## 2017-08-08 NOTE — Progress Notes (Signed)
Family Medicine Teaching Service Daily Progress Note Intern Pager: [phone]  Patient name: Linda [name] Medical record number: [id number] Date of birth: 08/20/1996 Age: 21 y.o. Gender: female  Primary Care Provider: Tonette Bihari, MD Consultants: Orthopedics Code Status: Full  Pt Overview and Major Events to Date:  12/03: admitted for R knee swelling and found to have septic bursitis 12/07: drainage of right knee performed by ortho  Assessment and Plan: Linda [name] is a 21 y.o. female presenting with right knee pain and swelling. PMH is significant for lupus, connective tissue disease, HTN, obesity.  Prepatellar septic bursitis of right knee: S/P I&D, day 3.  Remains afebrile without tachycardia or tachypnea.  Leukocytosis improving from 18.9-17.3 today.  Blood cultures remain negative.  Hemoglobin stable.  Pain seems to be symptomatically controlled. Orthopedics recommending changing the wrap today.  No signs of compartment syndrome. -Encourage patient to ambulate and weight-bear as tolerated -Continue doxycycline for completion of 14 days of antibiotics, received vancomycin (12/4-12/8) - Blood Cx with NGx4 days -Orthopedics consulted, appreciate recommendations- will change wrap as instructed and follow-up outpatient -Continue OxyIR 5-10 mg every 4 hours as needed, scheduled Tylenol every 6 hours - PT/OT recommending rolling w PT/OT recommending rolling walker  Hypertension: Chronic.  Uncontrolled due to nonadherence to medications.  Exacerbation likely due to increased pain in the right knee.  -Continue home amlodipine 10 mg daily -Restarting HCTZ 25 mg daily and continue holding lisinopril 20 mg daily -Pain control per above  Leukocytosis: Acute.  Improving.  Likely due to resolving septic bursitis.  Remains afebrile without signs of infection at incision site.  No signs of UTI, PNA, pressure injuries. -Monitor with CBC  Multiple connective tissue disease and  lupus: Chronic.  Suboptimal control.  Not currently on DM ARD's or steroids at home.  Scheduled to follow-up with a new rheumatologist in February.  Previously seen by [facility] rheumatologist. - Follow-up with rheumatology outpatient as scheduled  FEN/GI: Regular diet, Miralax daily, KVO Prophylaxis: Lovenox SQ  Disposition: Home  Subjective:  Patient reports pain is in better control. She says she walked with PT twice yesterday and would like to go home if she can arrange transport in the snow. She says she can go to the clinic follow up appointment and will go to her rheumatology appointment.   Objective: Temp:  [97.6 F (36.4 C)-97.9 F (36.6 C)] 97.9 F (36.6 C) (12/09 0620) Pulse Rate:  [76-101] 76 (12/09 0620) Resp:  [19-20] 19 (12/09 0620) BP: (132-141)/(89-100) 132/100 (12/09 0620) SpO2:  [86 %-100 %] 100 % (12/09 1017) Physical Exam: General: obese, mild distress with movement of right leg with non-toxic appearance HEENT: normocephalic, atraumatic, moist mucous membranes Cardiovascular: regular rate and rhythm without murmurs, rubs, or gallops Lungs: clear to auscultation bilaterally with normal work of breathing Abdomen: soft, non-tender, non-distended, normoactive bowel sounds Skin: warm, dry, no rashes or lesions, cap refill < 2 seconds Extremities: warm and well perfused, normal tone, dry wrap on right knee without edema or erythema, pedal pulse 2+ bilaterally Neuro: grossly intact throughout, moves all 4 extremities, no decreased sensation distal to right knee  Laboratory: Recent Labs  Lab 08/06/17 0257 08/07/17 0522 08/08/17 0316  WBC 11.5* 18.9* 17.3*  HGB 11.9* 11.4* 11.1*  HCT 36.7 35.1* 34.4*  PLT 207 263 273   Recent Labs  Lab 08/04/17 0416 08/05/17 1310 08/06/17 0821  NA 132* 136 136  K 3.8 3.7 3.8  CL 99* 102 100*  CO2 19* 26 25  BUN 9 <5* <5*  CREATININE 0.84 0.81 0.78  CALCIUM 8.6* 8.8* 9.2  GLUCOSE 92 85 88   CRP 12.7 ESR  21  Imaging/Diagnostic Tests: Dg Knee 2 Views Right  Result Date: 08/02/2017 CLINICAL DATA:  Right knee swelling and drainage. History of mixed connective tissue disease. EXAM: RIGHT KNEE - 1-2 VIEW COMPARISON:  Radiographs 04/09/2017.  MRI 04/21/2017. FINDINGS: The mineralization and alignment are normal. There is no evidence of acute fracture or dislocation. The joint spaces are maintained. Extensive soft tissue calcifications are again noted anteriorly, similar to previous studies. No significant joint effusion identified. IMPRESSION: Stable radiographic appearance of the right knee with extensive soft tissue calcifications consistent with history of mixed connective tissue disease. No acute osseous findings. Electronically Signed   By: Richardean Sale M.D.   On: 08/02/2017 14:54   Mr Humerus Right W Wo Contrast  Result Date: 08/02/2017 CLINICAL DATA:  Having significant pain to the right upper arm with swelling. She has pain superficially to the skin. EXAM: MRI OF THE RIGHT HUMERUS WITHOUT AND WITH CONTRAST TECHNIQUE: Multiplanar, multisequence MR imaging of the right humerus was performed before and after the administration of intravenous contrast. CONTRAST:  48m MULTIHANCE GADOBENATE DIMEGLUMINE 529 MG/ML IV SOLN COMPARISON:  None. FINDINGS: Bones/Joint/Cartilage No fracture or dislocation. Normal alignment. No joint effusion. No periosteal reaction or bone destruction. No aggressive osseous lesion. Ligaments Ligaments are suboptimally evaluated by CT. Muscles and Tendons Mild edema and enhancement in the proximal short head of the biceps muscle at the muscular tendinous junction which may reflect muscle strain versus mild myositis secondary to an infectious or inflammatory etiology. Muscles are otherwise normal in signal. No intramuscular fluid collection or hematoma. Supraspinatus tendon is intact. Infraspinatus tendon is intact. Teres minor tendon is intact. Subscapularis tendon is intact. Soft  tissue No soft tissue edema within the subcutaneous fat. No fluid collection or hematoma. No soft tissue mass. IMPRESSION: 1. Mild edema and enhancement in the proximal short head of the biceps muscle at the muscular tendinous junction which may reflect muscle strain versus mild myositis secondary to an infectious or inflammatory etiology. 2. No MRI evidence of cellulitis. No drainable fluid collection to suggest an abscess. Electronically Signed   By: HKathreen Devoid  On: 08/02/2017 21:57   Mr Knee Right W Wo Contrast  Result Date: 08/02/2017 CLINICAL DATA:  21yo F with Lupus/mixed connective tissue disease with a chief complaint of recurrent cellulitis. This has been going on for the past few days. Having significant pain to the right knee with swelling. She has pain superficially to the skin. EXAM: MRI OF THE RIGHT KNEE WITHOUT AND WITH CONTRAST TECHNIQUE: Multiplanar, multisequence MR imaging of the right knee was performed both before and after administration of intravenous contrast. CONTRAST:  28mMULTIHANCE GADOBENATE DIMEGLUMINE 529 MG/ML IV SOLN COMPARISON:  None. FINDINGS: MENISCI Medial meniscus:  Intact. Lateral meniscus:  Intact. LIGAMENTS Cruciates:  Intact ACL and PCL. Collaterals: Medial collateral ligament is intact. Lateral collateral ligament complex is intact. CARTILAGE Patellofemoral:  No chondral defect. Medial:  No chondral defect. Lateral:  No chondral defect. Joint: No joint effusion. Mild edema in Hoffa's fat. No plical thickening. Small amount of fluid in the deep infrapatellar bursa. Popliteal Fossa:  No Baker cyst.  Intact popliteus tendon. Extensor Mechanism: Intact quadriceps tendon and patellar tendon. Intact medial and lateral patellar retinaculum. Intact MPFL. Bones: No marrow signal abnormality. No fracture or dislocation. No aggressive osseous lesion. No periosteal reaction or bone destruction.  Soft tissue: Dystrophic calcification within the subcutaneous fat anterior to the  distal quadriceps tendon, patella and patellar tendon consistent with the patient's history of connective tissue disease. Soft tissue edema in the subcutaneous fat along the anterior aspect of the knee with enhancement on postcontrast imaging most concerning for cellulitis. No focal fluid collection to suggest an abscess. IMPRESSION: 1. No internal derangement of the right knee. 2. Cellulitis along the anterior aspect of the knee. No drainable fluid collection to suggest a hematoma. 3. Small amount of fluid in the deep infrapatellar bursa consistent with bursitis with mild adjacent soft tissue edema in Hoffa's fat. 4. No significant joint effusion to suggest septic arthritis. Electronically Signed   By: Kathreen Devoid   On: 08/02/2017 22:02    Elianne Gubser, [place], DO 08/08/2017, 9:51 AM PGY-1, [place] Intern pager: [phone], text pages welcome

## 2017-08-08 NOTE — Progress Notes (Signed)
Orthopaedic Trauma Service (OTS)  1 Day Post-Op Procedure(s) (LRB): IRRIGATION AND DEBRIDEMENT KNEE (Right)  Subjective: Patient reports pain as mild.    Objective: Current Vitals Blood pressure (!) 132/100, pulse 76, temperature 97.9 F (36.6 C), temperature source Oral, resp. rate 19, height 5\' 6"  (1.676 m), weight 102.7 kg (226 lb 6.6 oz), last menstrual period 07/31/2017, SpO2 100 %. Vital signs in last 24 hours: Temp:  [97.6 F (36.4 C)-97.9 F (36.6 C)] 97.9 F (36.6 C) (12/09 0620) Pulse Rate:  [76-101] 76 (12/09 0620) Resp:  [19-20] 19 (12/09 0620) BP: (132-141)/(89-100) 132/100 (12/09 0620) SpO2:  [86 %-100 %] 100 % (12/09 0620)  Intake/Output from previous day: 12/08 0701 - 12/09 0700 In: 2213.1 [P.O.:600; I.V.:1613.1] Out: 850 [Urine:850]  LABS Recent Labs    08/05/17 1310 08/06/17 0257 08/07/17 0522 08/08/17 0316  HGB 11.5* 11.9* 11.4* 11.1*   Recent Labs    08/07/17 0522 08/08/17 0316  WBC 18.9* 17.3*  RBC 3.69* 3.57*  HCT 35.1* 34.4*  PLT 263 273   Recent Labs    08/05/17 1310 08/06/17 0821  NA 136 136  K 3.7 3.8  CL 102 100*  CO2 26 25  BUN <5* <5*  CREATININE 0.81 0.78  GLUCOSE 85 88  CALCIUM 8.8* 9.2   No results for input(s): LABPT, INR in the last 72 hours.   Physical Exam RLE Dressing intact, mild drainage, zero erythema  Edema/ swelling controlled  Sens: DPN, SPN, TN intact  Motor: EHL, FHL, and lessor toe ext and flex all intact   Brisk cap refill, warm to touch  Assessment/Plan: 1 Day Post-Op Procedure(s) (LRB): IRRIGATION AND DEBRIDEMENT KNEE (Right)  I changed dressing today 1. PT/OT  2. DVT proph Lovenox 3. F/u 8-14 days  Altamese Wahneta, MD [facility] [phone] (p)

## 2017-08-08 NOTE — Progress Notes (Signed)
Orthopaedic Trauma Service (OTS)  2 Days Post-Op Procedure(s) (LRB): IRRIGATION AND DEBRIDEMENT KNEE (Right)  Subjective: Patient reports pain as mild.  Worked with PT yesterday.  Objective: Current Vitals Blood pressure (!) 132/100, pulse 76, temperature 97.9 F (36.6 C), temperature source Oral, resp. rate 19, height 5\' 6"  (1.676 m), weight 102.7 kg (226 lb 6.6 oz), last menstrual period 07/31/2017, SpO2 100 %. Vital signs in last 24 hours: Temp:  [97.6 F (36.4 C)-97.9 F (36.6 C)] 97.9 F (36.6 C) (12/09 0620) Pulse Rate:  [76-101] 76 (12/09 0620) Resp:  [19-20] 19 (12/09 0620) BP: (132-141)/(89-100) 132/100 (12/09 0620) SpO2:  [86 %-100 %] 100 % (12/09 0620)  Intake/Output from previous day: 12/08 0701 - 12/09 0700 In: 2213.1 [P.O.:600; I.V.:1613.1] Out: 850 [Urine:850]  LABS Recent Labs    08/05/17 1310 08/06/17 0257 08/07/17 0522 08/08/17 0316  HGB 11.5* 11.9* 11.4* 11.1*   Recent Labs    08/07/17 0522 08/08/17 0316  WBC 18.9* 17.3*  RBC 3.69* 3.57*  HCT 35.1* 34.4*  PLT 263 273   Recent Labs    08/05/17 1310 08/06/17 0821  NA 136 136  K 3.7 3.8  CL 102 100*  CO2 26 25  BUN <5* <5*  CREATININE 0.81 0.78  GLUCOSE 85 88  CALCIUM 8.8* 9.2   No results for input(s): LABPT, INR in the last 72 hours.   Physical Exam RLE Dressing intact, decreasing drainage, resolving erythema  Edema/ swelling controlled  No NV changes  Assessment/Plan: 2 Days Post-Op Procedure(s) (LRB): IRRIGATION AND DEBRIDEMENT KNEE (Right) 1. PT/OT  2. DVT proph Lovenox 3. F/u 8-14 days with Dr. Nolon Stalls, MD [facility], [facility] [phone] (p)

## 2017-08-09 LAB — BASIC METABOLIC PANEL
Anion gap: 10 (ref 5–15)
BUN: 8 mg/dL (ref 6–20)
CO2: 27 mmol/L (ref 22–32)
Calcium: 9.4 mg/dL (ref 8.9–10.3)
Chloride: 99 mmol/L — ABNORMAL LOW (ref 101–111)
Creatinine, Ser: 0.75 mg/dL (ref 0.44–1.00)
GFR calc Af Amer: >60 mL/min (ref >60)
GFR calc non Af Amer: >60 mL/min (ref >60)
Glucose, Bld: 87 mg/dL (ref 65–99)
Potassium: 3.8 mmol/L (ref 3.5–5.1)
Sodium: 136 mmol/L (ref 135–145)

## 2017-08-09 LAB — CBC
HCT: 40.5 % (ref 36.0–46.0)
Hemoglobin: 13.1 g/dL (ref 12.0–15.0)
MCH: 30.7 pg (ref 26.0–34.0)
MCHC: 32.3 g/dL (ref 30.0–36.0)
MCV: 94.8 fL (ref 78.0–100.0)
Platelets: 315 10*3/uL (ref 150–400)
RBC: 4.27 MIL/uL (ref 3.87–5.11)
RDW: 13.4 % (ref 11.5–15.5)
WBC: 11.7 10*3/uL — ABNORMAL HIGH (ref 4.0–10.5)

## 2017-08-09 LAB — PREGNANCY, URINE: Preg Test, Ur: NEGATIVE

## 2017-08-09 MED ORDER — DOXYCYCLINE HYCLATE 100 MG PO TABS: 100.0000 mg | ORAL_TABLET | Freq: Two times a day (BID) | ORAL | 0 refills | Status: AC

## 2017-08-09 MED ORDER — OXYCODONE HCL 5 MG PO TABS
5.0000 mg | ORAL_TABLET | Freq: Four times a day (QID) | ORAL | 0 refills | Status: DC | PRN
Start: 2017-08-09 — End: 2017-12-04

## 2017-08-09 NOTE — Progress Notes (Signed)
Family Medicine Teaching Service Daily Progress Note Intern Pager: [phone]  Patient name: Linda [name] Medical record number: [id number] Date of birth: Dec 08, 1995 Age: 21 y.o. Gender: female  Primary Care Provider: Tonette Bihari, MD Consultants: Orthopedics Code Status: Full  Pt Overview and Major Events to Date:  12/03: admitted for R knee swelling and found to have septic bursitis 12/07: drainage of right knee performed by ortho  Assessment and Plan: Linda [name] is a 21 y.o. female presenting with right knee pain and swelling. PMH is significant for lupus, connective tissue disease, HTN, obesity.  Prepatellar septic bursitis of right knee: S/P I&D, day 4. Leukocytosis improving from 18.9>11.7 today.  Blood cultures negative. Pain controlled. Orthopedics recommending f/u outpatient. -Encourage patient to ambulate and weight-bear as tolerated -Continue doxycycline for completion of 14 days of antibiotics, received vancomycin (12/4-12/8) - Blood Cx with NG -Orthopedics consulted, appreciate recommendations-  follow-up outpatient -Continue OxyIR 5-10 mg every 4 hours as needed, scheduled Tylenol every 6 hours - PT/OT recommending rolling walker  Hypertension: Chronic.  Uncontrolled due to nonadherence to medications. -Continue home amlodipine 10 mg daily -Restarting HCTZ 25 mg daily and lisinopril 20 mg daily  Leukocytosis: Improving. Likely due to resolving septic bursitis.  -Monitor with CBC, continue doxy  Multiple connective tissue disease and lupus: Chronic.  Suboptimal control.  Not currently on DM ARD's or steroids at home.  Scheduled to follow-up with a new rheumatologist in February.  Previously seen by [facility] rheumatologist. - Follow-up with rheumatology outpatient as scheduled  Contraception Management: Patient had nexplanon implantation 01/19/2017 per records - Will need replacing if date is accurate - POCT urine pregnancy   FEN/GI: Regular diet,  Miralax daily, KVO Prophylaxis: Lovenox SQ  Disposition: Home  Subjective:  Patient reports that she feels better. She is unable to get home due to weather. She has no complaints.   Objective: Temp:  [98.1 F (36.7 C)-98.3 F (36.8 C)] 98.1 F (36.7 C) (12/10 0548) Pulse Rate:  [72-78] 73 (12/10 0548) Resp:  [16-20] 16 (12/10 0548) BP: (127-138)/(91-103) 138/103 (12/10 0548) SpO2:  [99 %-100 %] 100 % (12/10 0548) Physical Exam: General: obese, laying in bed, NAD Cardiovascular: RRR, no m/r/g Lungs: CTABL, normal work of breathing Abdomen: soft, nontender Skin: warm, dry, diffuse circumferential areas of hyperpigmentation and crusting involving surfaces of the skin Extremities:warm and well-profused, dry wrap on right knee without edema or erythema, pedal pulse 2+ bilaterally Neuro: grossly intact throughout, moves all 4 extremities, no decreased sensation distal to right knee  Laboratory: Recent Labs  Lab 08/07/17 0522 08/08/17 0316 08/09/17 0537  WBC 18.9* 17.3* 11.7*  HGB 11.4* 11.1* 13.1  HCT 35.1* 34.4* 40.5  PLT 263 273 315   Recent Labs  Lab 08/05/17 1310 08/06/17 0821 08/09/17 0537  NA 136 136 136  K 3.7 3.8 3.8  CL 102 100* 99*  CO2 '26 25 27  '$ BUN <5* <5* 8  CREATININE 0.81 0.78 0.75  CALCIUM 8.8* 9.2 9.4  GLUCOSE 85 88 87   CRP 12.7 ESR 21  Imaging/Diagnostic Tests: No results found.   Shirley, [place], DO 08/09/2017, 7:24 AM PGY-1, [place] Intern pager: [phone], text pages welcome

## 2017-08-09 NOTE — Progress Notes (Signed)
Physical Therapy Treatment Patient Details Name: Linda [name] MRN: [id number] DOB: 04-14-1996 Today's Date: 08/09/2017    History of Present Illness 21 yo female with known connective tissue disease, lupus, and multiple admissions for right knee effusion presumed to be autoimmune in etiology who presents today with the same.  States she has been in pain for past few weeks, but worse past several days. Imaging on 08/02/17 of R knee showing Cellulitis along the anterior aspect of the knee and R wrist showing no acute fracture or dislocation. Pt is s/p surgical debridement 08/06/17.     PT Comments    Session focus on preparing patient for DC, progression of functional mobility. The patient demonstrates a significant increase in AMB distance with RW, but is limited by pain in right knee and fatigue in BUE. Pt does well with performance of stairs, but limits knee flexion ROM as an antalgic movement strategy. Educated patient on importance of AROM of the right knee daily, to avoid joint stiffness and pain. Pt verbalizes understanding. Pt/mother endorse feelings of preparedness for DC, all questions are addressed.    Follow Up Recommendations  Supervision for mobility/OOB;Home health PT     Equipment Recommendations  Rolling walker with 5" wheels    Recommendations for Other Services       Precautions / Restrictions Precautions Precautions: Fall Required Braces or Orthoses: Knee Immobilizer - Right(pt refuses on this date, reports brace will hurt her staples. ) Restrictions Weight Bearing Restrictions: Yes RLE Weight Bearing: Weight bearing as tolerated    Mobility  Bed Mobility Overal bed mobility: Needs Assistance Bed Mobility: Sit to Supine;Supine to Sit     Supine to sit: Modified independent (Device/Increase time) Sit to supine: Modified independent (Device/Increase time)      Transfers Overall transfer level: Modified independent Equipment used: Rolling walker (2  wheeled) Transfers: Sit to/from Stand Sit to Stand: Modified independent (Device/Increase time)         General transfer comment: Pt refuses KI; explained importance of prevent knee buckling  Ambulation/Gait Ambulation/Gait assistance: Min guard Ambulation Distance (Feet): 120 Feet Assistive device: Rolling walker (2 wheeled) Gait Pattern/deviations: Step-to pattern;Decreased stance time - right;Decreased weight shift to right Gait velocity: 0.11m/s.  Gait velocity interpretation: <1.8 ft/sec, indicative of risk for recurrent falls General Gait Details: gradual loss of weigth bearing tolerance over first 60 feet, requriing heavy cues for upright trunk posturing. Pt with heavy use of BUE on RW; used patient's RW tis session and adjusted height for propper fit.    Stairs Stairs: Yes   Stair Management: Two rails;One rail Left;Step to pattern;Forwards;Backwards;Sideways Number of Stairs: 6 General stair comments: Showed multiple techniques for entry of home. Educated with mother in attendance who has also had to perform this as a patient in the past.   Wheelchair Mobility    Modified Rankin (Stroke Patients Only)       Balance Overall balance assessment: Modified Independent;No apparent balance deficits (not formally assessed)                                          Cognition Arousal/Alertness: Awake/alert Behavior During Therapy: WFL for tasks assessed/performed Overall Cognitive Status: Within Functional Limits for tasks assessed  Exercises General Exercises - Lower Extremity Heel Slides: AROM;Right;15 reps;Seated(educated for HEP )    General Comments        Pertinent Vitals/Pain Pain Assessment: 0-10 Pain Score: 7  Pain Location: R knee Pain Descriptors / Indicators: Aching Pain Intervention(s): Limited activity within patient's tolerance;Monitored during session;RN gave pain meds during  session    Home Living Family/patient expects to be discharged to:: Private residence Living Arrangements: Parent                  Prior Function            PT Goals (current goals can now be found in the care plan section) Acute Rehab PT Goals Patient Stated Goal: get better PT Goal Formulation: With patient Time For Goal Achievement: 08/11/17 Potential to Achieve Goals: Good Progress towards PT goals: Progressing toward goals    Frequency    Min 3X/week      PT Plan Current plan remains appropriate    Co-evaluation              AM-PAC PT "6 Clicks" Daily Activity  Outcome Measure  Difficulty turning over in bed (including adjusting bedclothes, sheets and blankets)?: A Little Difficulty moving from lying on back to sitting on the side of the bed? : A Little Difficulty sitting down on and standing up from a chair with arms (e.g., wheelchair, bedside commode, etc,.)?: A Little Help needed moving to and from a bed to chair (including a wheelchair)?: A Little Help needed walking in hospital room?: A Little Help needed climbing 3-5 steps with a railing? : A Lot 6 Click Score: 17    End of Session Equipment Utilized During Treatment: Gait belt;Right knee immobilizer(attempted to use, pt refused. ) Activity Tolerance: Patient tolerated treatment well;Patient limited by fatigue;Patient limited by pain Patient left: in bed;with call bell/phone within reach;with family/visitor present Nurse Communication: Mobility status PT Visit Diagnosis: Difficulty in walking, not elsewhere classified (R26.2);Pain Pain - Right/Left: Right Pain - part of body: Knee     Time: 4034-7425 PT Time Calculation (min) (ACUTE ONLY): 38 min  Charges:  $Gait Training: 8-22 mins $Therapeutic Activity: 8-22 mins                    G Codes:       2:32 PM, 08/26/17 Etta Grandchild, PT, DPT [facility] Therapist - [place] [phone] (Pager)  [phone] (Mobile)   [phone] (Office)     Buccola,Allan C 08/26/17, 2:32 PM

## 2017-08-11 LAB — AEROBIC/ANAEROBIC CULTURE W GRAM STAIN (SURGICAL/DEEP WOUND): Gram Stain: NONE SEEN

## 2017-08-13 ENCOUNTER — Inpatient Hospital Stay: Payer: Medicaid Other | Admitting: Internal Medicine

## 2017-08-17 ENCOUNTER — Ambulatory Visit: Payer: Medicaid Other | Admitting: Obstetrics

## 2017-08-19 ENCOUNTER — Other Ambulatory Visit: Payer: Self-pay

## 2017-08-19 ENCOUNTER — Encounter: Payer: Self-pay | Admitting: Licensed Clinical Social Worker

## 2017-08-19 ENCOUNTER — Encounter: Payer: Self-pay | Admitting: Internal Medicine

## 2017-08-19 ENCOUNTER — Ambulatory Visit: Payer: Medicaid Other | Admitting: Internal Medicine

## 2017-08-19 DIAGNOSIS — M351 Other overlap syndromes: Secondary | ICD-10-CM | POA: Diagnosis not present

## 2017-08-19 DIAGNOSIS — F4320 Adjustment disorder, unspecified: Secondary | ICD-10-CM | POA: Insufficient documentation

## 2017-08-19 DIAGNOSIS — M71161 Other infective bursitis, right knee: Secondary | ICD-10-CM | POA: Insufficient documentation

## 2017-08-19 DIAGNOSIS — F4321 Adjustment disorder with depressed mood: Secondary | ICD-10-CM | POA: Insufficient documentation

## 2017-08-19 DIAGNOSIS — I10 Essential (primary) hypertension: Secondary | ICD-10-CM | POA: Diagnosis not present

## 2017-08-19 HISTORY — DX: Other infective bursitis, right knee: M71.161

## 2017-08-19 NOTE — Assessment & Plan Note (Signed)
Continuing to improve since hospital discharge. Patient has not completed course of doxy, however no systemic signs of infection and no signs of infection at incision site. Has not scheduled f/u appt with ortho (Dr. Doreatha Martin). Is still having pain but is not currently taking anything for this. Stressed importance of completing antibiotic course to ensure healing. Also discussed importance of scheduled ortho appt within the next week to have staples removed and ensure she is healing properly and does not need additional work-up. Discussed taking ibuprofen/Tylenol for pain PRN.  - F/u with PCP in one month - Schedule ortho appt - Complete doxy

## 2017-08-19 NOTE — Assessment & Plan Note (Signed)
BP elevated to 162/98. Slightly improved to systolic 092 on repeat. Possibly elevated due to patient's current distress over recent break-up, as repeat was measured after she had been crying, however more likely due to medication non-compliance. Will not adjust today as does not seem patient has taken medication.  - F/u with PCP Dr. Emmaline Life in one month

## 2017-08-19 NOTE — Progress Notes (Signed)
  Type of Service: Integrated Behavioral Health warm handoff  Estimate time:20 minutes : Interpreter:No.  SUBJECTIVE: Linda [name] is a 21 y.o. female referred by Dr. Avon Gully for concerns with none compliance with medication and f/u appointments. Patient reports she has many stressors in her life and was unable to remember to make appointment and take medication. Relationship breakup, financial stressors and health concerns.   LIFE CONTEXT:  Linda [name] lives with mother ,Boyfriend was living with them but moved out  School / Work : unable to work; applied for disability  Life changes: breakup of 5 year relationship; health concerns  GOALS: Patient will reduce symptoms of: stress, and increase  ability QQ:UIVHOY skills and self-management skills, will also Increase healthy adjustment to current life circumstances.  INTERVENTION: , Reflective listening, Behavioral Therapy (Relaxed breathing), Problem-solving teaching/coping strategies and Psychoeducation ; Supportive Counseling  ISSUES DISCUSSED: Integrated care services, support system, previous and current coping skills, community resources , barriers to taking medication and F/U appointments;  and options for reminders.    ASSESSMENT:Patient currently experiencing symptoms of stress.  Symptoms exacerbated by relationship breakup, financial stressors and managing chronic medical condition . Patient may benefit from, and is in agreement to receive further assessment and brief therapeutic interventions to assist with managing her symptoms.  PLAN:   1.Patient will F/U with LCSW in 2 weeks  2. LCSW will F/U with phone call if patient is no show for appointment  3. Behavioral recommendations: relaxed breathing  4. Referral:None at this time   5. Patient will call to schedule F/U ortho appointment with Dr. Doreatha Martin  Warm Hand Off Completed.     Casimer Lanius, LCSW Licensed Clinical Social Worker [facility]    [phone] 4:08 PM

## 2017-08-19 NOTE — Patient Instructions (Addendum)
It was nice meeting you today Linda [name]!  It is VERY important to finish your antibiotics (doxycycline). If you do not finish this medication, your infection could return and you may need to be hospitalized again.   It is also very important to schedule an appointment with Dr. Doreatha Martin (the orthopedic doctor) to make sure your knee is healing well. You should schedule this appointment for some time next week. His office phone number is [phone].   It is also very important to go to your rheumatology appointment in February.   If you are having thoughts of hurting yourself or anyone else, please call 911 immediately or go to the emergency room immediately. You can also call the [place] at [phone].   We will see you back in about a month to see how you are doing.   If you have any questions or concerns, please feel free to call the clinic.   Be well,  Dr. Avon Gully

## 2017-08-19 NOTE — Assessment & Plan Note (Signed)
Confirmed that patient does have appt scheduled in Feb with rheumatology and intends to attend this appt
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
## 2017-08-19 NOTE — Assessment & Plan Note (Signed)
After recent breakup with long-term boyfriend. Tearful while discussing this during encounter. Admitted thoughts of SI without plan, however not currently suicidal. Denies HI. Has support system in place. As patient has recent stressor both of break-up and hospital admission, do not feel that anti-depressants are indicated at this time. Would however continue to closely monitor her mood. Encouraged patient to call 911 or go to ED or call Suicide Prevention Hotline if has further thoughts of SI, and provided Prevention Hotline phone number.

## 2017-09-01 ENCOUNTER — Telehealth: Payer: Self-pay | Admitting: Licensed Clinical Social Worker

## 2017-09-01 NOTE — Progress Notes (Signed)
Service : [facility] F/U Call   F/U call to patient reference interventions discussed during warm hand off from PCP.  Reminded patient of appointment with [facility] tomorrow. Patient appreciative of F/U call.  Casimer Lanius, LCSW Licensed Clinical Social Worker [place]   [phone] 2:14 PM

## 2017-09-02 ENCOUNTER — Ambulatory Visit: Payer: Medicaid Other

## 2017-09-14 ENCOUNTER — Encounter (HOSPITAL_COMMUNITY): Payer: Self-pay | Admitting: Emergency Medicine

## 2017-09-14 ENCOUNTER — Emergency Department (HOSPITAL_COMMUNITY): Payer: Medicaid Other

## 2017-09-14 ENCOUNTER — Emergency Department (HOSPITAL_COMMUNITY)
Admission: EM | Admit: 2017-09-14 | Discharge: 2017-09-14 | Disposition: A | Discharge status: Home / Self Care | Payer: Medicaid Other | Attending: Emergency Medicine | Admitting: Emergency Medicine

## 2017-09-14 DIAGNOSIS — R519 Headache, unspecified: Secondary | ICD-10-CM

## 2017-09-14 DIAGNOSIS — Z79899 Other long term (current) drug therapy: Secondary | ICD-10-CM | POA: Insufficient documentation

## 2017-09-14 DIAGNOSIS — R51 Headache: Secondary | ICD-10-CM | POA: Diagnosis not present

## 2017-09-14 DIAGNOSIS — F17290 Nicotine dependence, other tobacco product, uncomplicated: Secondary | ICD-10-CM | POA: Insufficient documentation

## 2017-09-14 DIAGNOSIS — I10 Essential (primary) hypertension: Secondary | ICD-10-CM | POA: Insufficient documentation

## 2017-09-14 LAB — BASIC METABOLIC PANEL
Anion gap: 13 (ref 5–15)
BUN: 9 mg/dL (ref 6–20)
CO2: 21 mmol/L — ABNORMAL LOW (ref 22–32)
Calcium: 9.3 mg/dL (ref 8.9–10.3)
Chloride: 103 mmol/L (ref 101–111)
Creatinine, Ser: 0.8 mg/dL (ref 0.44–1.00)
GFR calc Af Amer: >60 mL/min (ref >60)
GFR calc non Af Amer: >60 mL/min (ref >60)
Glucose, Bld: 96 mg/dL (ref 65–99)
Potassium: 3.2 mmol/L — ABNORMAL LOW (ref 3.5–5.1)
Sodium: 137 mmol/L (ref 135–145)

## 2017-09-14 LAB — CBC
HCT: 44.3 % (ref 36.0–46.0)
Hemoglobin: 14.6 g/dL (ref 12.0–15.0)
MCH: 31.6 pg (ref 26.0–34.0)
MCHC: 33 g/dL (ref 30.0–36.0)
MCV: 95.9 fL (ref 78.0–100.0)
Platelets: 120 10*3/uL — ABNORMAL LOW (ref 150–400)
RBC: 4.62 MIL/uL (ref 3.87–5.11)
RDW: 14.6 % (ref 11.5–15.5)
WBC: 11 10*3/uL — ABNORMAL HIGH (ref 4.0–10.5)

## 2017-09-14 LAB — I-STAT BETA HCG BLOOD, ED (MC, WL, AP ONLY): I-stat hCG, quantitative: <5 m[IU]/mL (ref <5)

## 2017-09-14 MED ORDER — AMLODIPINE BESYLATE 10 MG PO TABS: 10.0000 mg | ORAL_TABLET | Freq: Every day | ORAL | 0 refills | Status: DC

## 2017-09-14 MED ORDER — HYDROCHLOROTHIAZIDE 25 MG PO TABS: 25.0000 mg | ORAL_TABLET | Freq: Every day | ORAL | 0 refills | Status: DC

## 2017-09-14 MED ORDER — LISINOPRIL 20 MG PO TABS
20.0000 mg | ORAL_TABLET | Freq: Once | ORAL | Status: AC
  Administered 2017-09-14: 20 mg via ORAL
  Filled 2017-09-14: qty 1

## 2017-09-14 MED ORDER — KETOROLAC TROMETHAMINE 60 MG/2ML IM SOLN
30.0000 mg | Freq: Once | INTRAMUSCULAR | Status: AC
  Administered 2017-09-14: 30 mg via INTRAMUSCULAR
  Filled 2017-09-14: qty 2

## 2017-09-14 MED ORDER — HYDROMORPHONE HCL 1 MG/ML IJ SOLN
1.0000 mg | Freq: Once | INTRAMUSCULAR | Status: AC
  Administered 2017-09-14: 1 mg via INTRAMUSCULAR
  Filled 2017-09-14: qty 1

## 2017-09-14 MED ORDER — HYDRALAZINE HCL 20 MG/ML IJ SOLN
20.0000 mg | Freq: Once | INTRAMUSCULAR | Status: AC
  Administered 2017-09-14: 20 mg via INTRAVENOUS
  Filled 2017-09-14: qty 1

## 2017-09-14 MED ORDER — TRAMADOL HCL 50 MG PO TABS: 50.0000 mg | ORAL_TABLET | Freq: Four times a day (QID) | ORAL | 0 refills | Status: DC | PRN

## 2017-09-14 MED ORDER — AMLODIPINE BESYLATE 5 MG PO TABS
10.0000 mg | ORAL_TABLET | Freq: Once | ORAL | Status: AC
  Administered 2017-09-14: 10 mg via ORAL
  Filled 2017-09-14: qty 2

## 2017-09-14 MED ORDER — LISINOPRIL 20 MG PO TABS: 20.0000 mg | ORAL_TABLET | Freq: Every day | ORAL | 0 refills | Status: DC

## 2017-09-14 MED ORDER — METOCLOPRAMIDE HCL 5 MG/ML IJ SOLN
10.0000 mg | Freq: Once | INTRAMUSCULAR | Status: AC
  Administered 2017-09-14: 10 mg via INTRAMUSCULAR
  Filled 2017-09-14: qty 2

## 2017-09-14 MED ORDER — IOPAMIDOL (ISOVUE-370) INJECTION 76%
INTRAVENOUS | Status: AC
  Filled 2017-09-14: qty 50

## 2017-09-14 NOTE — ED Notes (Signed)
This RN called to CT due to IV access issue. This RN attempted second IV. Pollina, MD notified and gave verbal order for CT head wo contrast.

## 2017-09-14 NOTE — ED Notes (Signed)
Patient transported to CT 

## 2017-09-14 NOTE — ED Provider Notes (Signed)
[facility] EMERGENCY DEPARTMENT Provider Note   CSN: [id number] Arrival date & time: 09/14/17  0400     History   Chief Complaint Chief Complaint  Patient presents with  . Headache  . Hypertension    HPI Linda [name] is a 22 y.o. female.  Patient presents to the emergency department for evaluation of headache.  Patient reports that the headache began yesterday.  She has been experiencing a constant, sharp, stabbing and severe right-sided headache.  Patient noted to be very hypertensive at arrival.  She reports a history of essential hypertension, has been noncompliant with her medications for approximately 3 weeks.  She reports that she ran out and did not go and pick up refills.  She has not had any chest pain, shortness of breath, heart palpitations.      Past Medical History:  Diagnosis Date  . Lupus   . Migraines   . Mixed connective tissue disease [facility])     Patient Active Problem List   Diagnosis Date Noted  . Septic prepatellar bursitis of right knee 08/19/2017  . Grief reaction 08/19/2017  . Right knee pain   . Elbow pain, right   . Back pain 05/04/2017  . Bilateral knee swelling 04/20/2017  . History of connective tissue disease   . Leg swelling   . Abdominal pain   . Lupus erythematosus   . Cellulitis 04/09/2017  . Primary hypertension 12/11/2016  . Mixed connective tissue disease ([place]) 02/11/2016  . High BMI 01/19/2014  . Implanon in place 01/19/2014    Past Surgical History:  Procedure Laterality Date  . I&D EXTREMITY Right 08/06/2017   Procedure: IRRIGATION AND DEBRIDEMENT KNEE;  Surgeon: Shona Needles, MD;  Location: [facility];  Service: Orthopedics;  Laterality: Right;  . MULTIPLE TOOTH EXTRACTIONS      OB History    Gravida Para Term Preterm AB Living   0 0 0 0 0 0   SAB TAB Ectopic Multiple Live Births   0 0 0 0         Home Medications    Prior to Admission medications   Medication Sig Start Date End Date  Taking? Authorizing Provider  etonogestrel (IMPLANON) 68 MG IMPL implant Inject 68 mg into the skin once. Implanted 01/15/14   Yes [provider]  Multiple Vitamins-Minerals (HAIR/SKIN/NAILS/BIOTIN) TABS Take 1 tablet by mouth 3 (three) times daily.   Yes [provider]  amLODipine (NORVASC) 10 MG tablet Take 1 tablet (10 mg total) by mouth daily. 09/14/17   Orpah Greek, MD  famotidine (PEPCID) 20 MG tablet Take 1 tablet (20 mg total) by mouth daily as needed for heartburn or indigestion. Patient not taking: Reported on 08/02/2017 04/14/17   Carlyle Dolly, MD  hydrochlorothiazide (HYDRODIURIL) 25 MG tablet Take 1 tablet (25 mg total) by mouth daily. 09/14/17   Orpah Greek, MD  lidocaine (LMX) 4 % cream Apply topically 3 (three) times daily as needed (pain). Patient not taking: Reported on 04/20/2017 04/14/17   Carlyle Dolly, MD  lisinopril (PRINIVIL,ZESTRIL) 20 MG tablet Take 1 tablet (20 mg total) by mouth daily. 09/14/17   Orpah Greek, MD  oxyCODONE (OXY IR/ROXICODONE) 5 MG immediate release tablet Take 1 tablet (5 mg total) by mouth every 6 (six) hours as needed for breakthrough pain. Patient not taking: Reported on 09/14/2017 08/09/17   Steve Rattler, DO  traMADol (ULTRAM) 50 MG tablet Take 1 tablet (50 mg total) by mouth  every 6 (six) hours as needed. 09/14/17   Orpah Greek, MD    Family History Family History  Problem Relation Age of Onset  . Lupus Mother   . Hypertension Mother   . Kidney disease Mother   . Diabetes Maternal Grandmother     Social History Social History   Tobacco Use  . Smoking status: Current Every Day Smoker    Packs/day: 0.00    Types: Cigarettes, Cigars    Start date: 02/11/2015  . Smokeless tobacco: Never Used  Substance Use Topics  . Alcohol use: No    Alcohol/week: 0.0 oz    Comment: social  . Drug use: No     Allergies   Vicodin [hydrocodone-acetaminophen]   Review of  Systems Review of Systems  Neurological: Positive for headaches.  All other systems reviewed and are negative.    Physical Exam Updated Vital Signs BP (!) 153/88   Pulse 100   Temp 97.6 F (36.4 C) (Oral)   Resp (!) 21   Ht 5\' 6"  (1.676 m)   Wt 104.3 kg (230 lb)   LMP 08/31/2017 (Within Days)   SpO2 100%   BMI 37.12 kg/m   Physical Exam  Constitutional: She is oriented to person, place, and time. She appears well-developed and well-nourished. No distress.  HENT:  Head: Normocephalic and atraumatic.  Right Ear: Hearing normal.  Left Ear: Hearing normal.  Nose: Nose normal.  Mouth/Throat: Oropharynx is clear and moist and mucous membranes are normal.  Eyes: Conjunctivae and EOM are normal. Pupils are equal, round, and reactive to light.  Neck: Normal range of motion. Neck supple.  Cardiovascular: Regular rhythm, S1 normal and S2 normal. Exam reveals no gallop and no friction rub.  No murmur heard. Pulmonary/Chest: Effort normal and breath sounds normal. No respiratory distress. She exhibits no tenderness.  Abdominal: Soft. Normal appearance and bowel sounds are normal. There is no hepatosplenomegaly. There is no tenderness. There is no rebound, no guarding, no tenderness at McBurney's point and negative Murphy's sign. No hernia.  Musculoskeletal: Normal range of motion.  Neurological: She is alert and oriented to person, place, and time. She has normal strength. No cranial nerve deficit or sensory deficit. Coordination normal. GCS eye subscore is 4. GCS verbal subscore is 5. GCS motor subscore is 6.  Skin: Skin is warm, dry and intact. No rash noted. No cyanosis.  Psychiatric: She has a normal mood and affect. Her speech is normal and behavior is normal. Thought content normal.  Nursing note and vitals reviewed.    ED Treatments / Results  Labs (all labs ordered are listed, but only abnormal results are displayed) Labs Reviewed  CBC - Abnormal; Notable for the following  components:      Result Value   WBC 11.0 (*)    Platelets 120 (*)    All other components within normal limits  BASIC METABOLIC PANEL - Abnormal; Notable for the following components:   Potassium 3.2 (*)    CO2 21 (*)    All other components within normal limits  I-STAT BETA HCG BLOOD, ED ([facility], [facility], [facility] ONLY)    EKG  EKG Interpretation None       Radiology Ct Head Wo Contrast  Result Date: 09/14/2017 CLINICAL DATA:  22 year old female with history of migraine and hypertension presenting with headache EXAM: CT HEAD WITHOUT CONTRAST TECHNIQUE: Contiguous axial images were obtained from the base of the skull through the vertex without intravenous contrast. COMPARISON:  None. FINDINGS: Brain:  No evidence of acute infarction, hemorrhage, hydrocephalus, extra-axial collection or mass lesion/mass effect. Vascular: No hyperdense vessel or unexpected calcification. Skull: Normal. Negative for fracture or focal lesion. Sinuses/Orbits: There is diffuse mucoperiosteal thickening of paranasal sinuses with partial opacification of the maxillary sinuses and opacification of multiple ethmoid air cells. The mastoid air cells are clear. Other: None IMPRESSION: 1. Normal noncontrast CT of the brain. 2. Paranasal sinus disease. Electronically Signed   By: Anner Crete M.D.   On: 09/14/2017 06:59    Procedures Procedures (including critical care time)  Medications Ordered in ED Medications  iopamidol (ISOVUE-370) 76 % injection (not administered)  hydrALAZINE (APRESOLINE) injection 20 mg (20 mg Intravenous Given 09/14/17 0552)  amLODipine (NORVASC) tablet 10 mg (10 mg Oral Given 09/14/17 0537)  lisinopril (PRINIVIL,ZESTRIL) tablet 20 mg (20 mg Oral Given 09/14/17 0537)  HYDROmorphone (DILAUDID) injection 1 mg (1 mg Intramuscular Given 09/14/17 0725)  metoCLOPramide (REGLAN) injection 10 mg (10 mg Intramuscular Given 09/14/17 0726)  ketorolac (TORADOL) injection 30 mg (30 mg Intramuscular Given 09/14/17  0725)     Initial Impression / Assessment and Plan / ED Course  I have reviewed the triage vital signs and the nursing notes.  Pertinent labs & imaging results that were available during my care of the patient were reviewed by me and considered in my medical decision making (see chart for details).     Patient presents to the emergency department for evaluation of headache.  Patient reports that she has been experiencing headache for 1 day.  She is noted to be markedly hypertensive in triage.  She has a history of hypertension and has been noncompliant with her medications.  Patient reports right-sided headache that was slow in onset and progressively worsening.  She does not have any neurologic deficit.  She is sent for CT scan.  Initially an attempt was made at CT angiography of head, but patient lost her IV and staff was unable to restart the IV.  Patient therefore had a noncontrast CT head which was normal.  Blood pressure significantly improved with treatment.  She was also treated with analgesia for headache.  Patient will be discharged, restart her blood pressure medications and follow-up with primary doctor.  Final Clinical Impressions(s) / ED Diagnoses   Final diagnoses:  Bad headache  Essential hypertension    ED Discharge Orders        Ordered    amLODipine (NORVASC) 10 MG tablet  Daily     09/14/17 0727    lisinopril (PRINIVIL,ZESTRIL) 20 MG tablet  Daily     09/14/17 0727    hydrochlorothiazide (HYDRODIURIL) 25 MG tablet  Daily     09/14/17 0727    traMADol (ULTRAM) 50 MG tablet  Every 6 hours PRN     09/14/17 0727       Orpah Greek, MD 09/14/17 [phone]

## 2017-09-14 NOTE — ED Triage Notes (Signed)
Pt arrives via EMS for hypertension, states noncompliance with her meds because she hasn't picked them up. Ambulatory in triage. Offered tylenol, refused "it never worksChief Strategy Officer

## 2017-09-14 NOTE — ED Notes (Signed)
ED Provider at bedside. 

## 2017-09-16 ENCOUNTER — Ambulatory Visit: Payer: Medicaid Other | Admitting: Internal Medicine

## 2017-09-28 ENCOUNTER — Ambulatory Visit (INDEPENDENT_AMBULATORY_CARE_PROVIDER_SITE_OTHER): Payer: Medicaid Other | Admitting: Obstetrics

## 2017-09-28 ENCOUNTER — Encounter: Payer: Self-pay | Admitting: Obstetrics

## 2017-09-28 VITALS — BP 148/95 | HR 96 | Wt 210.8 lb

## 2017-09-28 DIAGNOSIS — Z3046 Encounter for surveillance of implantable subdermal contraceptive: Secondary | ICD-10-CM

## 2017-09-28 NOTE — Progress Notes (Signed)
Presents for Nexplanon removal. (inserted 01/05/14).  BP elevated, she took BP meds. 2-3 hrs ago.

## 2017-09-30 ENCOUNTER — Ambulatory Visit: Payer: Medicaid Other | Admitting: Obstetrics

## 2017-09-30 ENCOUNTER — Encounter: Payer: Self-pay | Admitting: Obstetrics

## 2017-09-30 VITALS — BP 157/103 | HR 109 | Wt 212.0 lb

## 2017-09-30 DIAGNOSIS — Z3049 Encounter for surveillance of other contraceptives: Secondary | ICD-10-CM

## 2017-09-30 DIAGNOSIS — Z3046 Encounter for surveillance of implantable subdermal contraceptive: Secondary | ICD-10-CM

## 2017-09-30 NOTE — Progress Notes (Signed)
RGYN patient presents for Nexplanon Removal today. Inserted:01/05/2014 by Dr.Harper

## 2017-09-30 NOTE — Progress Notes (Signed)
[facility] REMOVAL NOTE  Date of LMP:   unknown  Contraception used: *Nexplanon   Indications:  The patient desires removal of Nexplanon.  She understands risks, benefits, and alternatives to Implanon and would like to proceed.  Anesthesia:   Lidocaine 1% plain.  Procedure:  A time-out was performed confirming the procedure and the patient's allergy status.  Complications: None                      The rod was palpated and the area was sterilely prepped.  The area beneath the distal tip was anesthetized with 1% xylocaine and the skin incised                       Over the tip and the tip was exposed, grasped with forcep and removed intact.  A single suture of 4-0 Vicryl was used to close incision.  Steri strip                       And a bandage applied and the arm was wrapped with gauze bandage.  The patient tolerated well.  Instructions:  The patient was instructed to remove the dressing in 24 hours and that some bruising is to be expected.  She was advised to use over the counter analgesics as needed for any pain at the site.  She is to keep the area dry for 24 hours and to call if her hand or arm becomes cold, numb, or blue.  Return visit:  Return in 2 weeks    Shelly Bombard MD

## 2017-09-30 NOTE — Progress Notes (Signed)
Patient rescheduled appointment.

## 2017-10-05 ENCOUNTER — Encounter: Payer: Self-pay | Admitting: *Deleted

## 2017-10-08 ENCOUNTER — Ambulatory Visit: Payer: Medicaid Other | Admitting: Internal Medicine

## 2017-10-14 ENCOUNTER — Ambulatory Visit: Payer: Medicaid Other | Admitting: Obstetrics

## 2017-11-22 ENCOUNTER — Inpatient Hospital Stay (HOSPITAL_COMMUNITY)
Admission: EM | Admit: 2017-11-22 | Discharge: 2017-11-25 | DRG: 603 | Disposition: A | Discharge status: Home / Self Care | Payer: Medicaid Other | Attending: Nephrology | Admitting: Nephrology

## 2017-11-22 ENCOUNTER — Inpatient Hospital Stay (HOSPITAL_COMMUNITY): Payer: Medicaid Other

## 2017-11-22 ENCOUNTER — Emergency Department (HOSPITAL_COMMUNITY): Payer: Medicaid Other

## 2017-11-22 ENCOUNTER — Encounter (HOSPITAL_COMMUNITY): Payer: Self-pay | Admitting: Family Medicine

## 2017-11-22 ENCOUNTER — Other Ambulatory Visit: Payer: Self-pay

## 2017-11-22 DIAGNOSIS — M329 Systemic lupus erythematosus, unspecified: Secondary | ICD-10-CM | POA: Diagnosis present

## 2017-11-22 DIAGNOSIS — F17210 Nicotine dependence, cigarettes, uncomplicated: Secondary | ICD-10-CM | POA: Diagnosis present

## 2017-11-22 DIAGNOSIS — L930 Discoid lupus erythematosus: Secondary | ICD-10-CM | POA: Diagnosis present

## 2017-11-22 DIAGNOSIS — Z832 Family history of diseases of the blood and blood-forming organs and certain disorders involving the immune mechanism: Secondary | ICD-10-CM | POA: Diagnosis not present

## 2017-11-22 DIAGNOSIS — Z885 Allergy status to narcotic agent status: Secondary | ICD-10-CM | POA: Diagnosis not present

## 2017-11-22 DIAGNOSIS — Z8249 Family history of ischemic heart disease and other diseases of the circulatory system: Secondary | ICD-10-CM | POA: Diagnosis not present

## 2017-11-22 DIAGNOSIS — Z7952 Long term (current) use of systemic steroids: Secondary | ICD-10-CM | POA: Diagnosis not present

## 2017-11-22 DIAGNOSIS — L03116 Cellulitis of left lower limb: Principal | ICD-10-CM | POA: Diagnosis present

## 2017-11-22 DIAGNOSIS — N179 Acute kidney failure, unspecified: Secondary | ICD-10-CM | POA: Diagnosis present

## 2017-11-22 DIAGNOSIS — M351 Other overlap syndromes: Secondary | ICD-10-CM | POA: Diagnosis present

## 2017-11-22 DIAGNOSIS — I10 Essential (primary) hypertension: Secondary | ICD-10-CM | POA: Diagnosis present

## 2017-11-22 DIAGNOSIS — L0390 Cellulitis, unspecified: Secondary | ICD-10-CM | POA: Diagnosis present

## 2017-11-22 DIAGNOSIS — M25562 Pain in left knee: Secondary | ICD-10-CM | POA: Diagnosis present

## 2017-11-22 DIAGNOSIS — L03317 Cellulitis of buttock: Secondary | ICD-10-CM | POA: Diagnosis not present

## 2017-11-22 DIAGNOSIS — Z79899 Other long term (current) drug therapy: Secondary | ICD-10-CM | POA: Diagnosis not present

## 2017-11-22 LAB — COMPREHENSIVE METABOLIC PANEL
ALT: 16 U/L (ref 14–54)
AST: 43 U/L — ABNORMAL HIGH (ref 15–41)
Albumin: 3.4 g/dL — ABNORMAL LOW (ref 3.5–5.0)
Alkaline Phosphatase: 73 U/L (ref 38–126)
Anion gap: 11 (ref 5–15)
BUN: 11 mg/dL (ref 6–20)
CO2: 24 mmol/L (ref 22–32)
Calcium: 9 mg/dL (ref 8.9–10.3)
Chloride: 97 mmol/L — ABNORMAL LOW (ref 101–111)
Creatinine, Ser: 1.2 mg/dL — ABNORMAL HIGH (ref 0.44–1.00)
GFR calc Af Amer: >60 mL/min (ref >60)
GFR calc non Af Amer: >60 mL/min (ref >60)
Glucose, Bld: 79 mg/dL (ref 65–99)
Potassium: 4.1 mmol/L (ref 3.5–5.1)
Sodium: 132 mmol/L — ABNORMAL LOW (ref 135–145)
Total Bilirubin: 2.3 mg/dL — ABNORMAL HIGH (ref 0.3–1.2)
Total Protein: 8 g/dL (ref 6.5–8.1)

## 2017-11-22 LAB — URINALYSIS, COMPLETE (UACMP) WITH MICROSCOPIC
Bacteria, UA: NONE SEEN
Bilirubin Urine: NEGATIVE
Glucose, UA: NEGATIVE mg/dL
Ketones, ur: NEGATIVE mg/dL
Nitrite: NEGATIVE
Protein, ur: 30 mg/dL — AB
Specific Gravity, Urine: 1.011 (ref 1.005–1.030)
pH: 6 (ref 5.0–8.0)

## 2017-11-22 LAB — I-STAT BETA HCG BLOOD, ED (MC, WL, AP ONLY): I-stat hCG, quantitative: <5 m[IU]/mL (ref <5)

## 2017-11-22 LAB — CBC WITH DIFFERENTIAL/PLATELET
Basophils Absolute: 0 10*3/uL (ref 0.0–0.1)
Basophils Relative: 0 %
Eosinophils Absolute: 0.4 10*3/uL (ref 0.0–0.7)
Eosinophils Relative: 2 %
HCT: 38.7 % (ref 36.0–46.0)
Hemoglobin: 12.5 g/dL (ref 12.0–15.0)
Lymphocytes Relative: 19 %
Lymphs Abs: 4.1 10*3/uL — ABNORMAL HIGH (ref 0.7–4.0)
MCH: 32 pg (ref 26.0–34.0)
MCHC: 32.3 g/dL (ref 30.0–36.0)
MCV: 99 fL (ref 78.0–100.0)
Monocytes Absolute: 2.6 10*3/uL — ABNORMAL HIGH (ref 0.1–1.0)
Monocytes Relative: 12 %
Neutro Abs: 14.2 10*3/uL — ABNORMAL HIGH (ref 1.7–7.7)
Neutrophils Relative %: 67 %
Platelets: 133 10*3/uL — ABNORMAL LOW (ref 150–400)
RBC: 3.91 MIL/uL (ref 3.87–5.11)
RDW: 14.3 % (ref 11.5–15.5)
WBC: 21.4 10*3/uL — ABNORMAL HIGH (ref 4.0–10.5)

## 2017-11-22 LAB — I-STAT CG4 LACTIC ACID, ED
Lactic Acid, Venous: 1.78 mmol/L (ref 0.5–1.9)
Lactic Acid, Venous: 0.89 mmol/L (ref 0.5–1.9)

## 2017-11-22 MED ORDER — SODIUM CHLORIDE 0.9 % IV BOLUS (SEPSIS): 1000.0000 mL | Freq: Once | INTRAVENOUS | Status: DC

## 2017-11-22 MED ORDER — HYDROMORPHONE HCL 1 MG/ML IJ SOLN
1.0000 mg | Freq: Once | INTRAMUSCULAR | Status: AC
  Administered 2017-11-22: 1 mg via INTRAVENOUS
  Filled 2017-11-22: qty 1

## 2017-11-22 MED ORDER — GADOBENATE DIMEGLUMINE 529 MG/ML IV SOLN
20.0000 mL | Freq: Once | INTRAVENOUS | Status: AC | PRN
  Administered 2017-11-22: 20 mL via INTRAVENOUS

## 2017-11-22 MED ORDER — ENOXAPARIN SODIUM 60 MG/0.6ML ~~LOC~~ SOLN
50.0000 mg | SUBCUTANEOUS | Status: DC
  Administered 2017-11-22 – 2017-11-23 (×2): 50 mg via SUBCUTANEOUS
  Filled 2017-11-22 (×2): qty 0.6

## 2017-11-22 MED ORDER — HYDRALAZINE HCL 20 MG/ML IJ SOLN
10.0000 mg | INTRAMUSCULAR | Status: DC | PRN
  Administered 2017-11-22 – 2017-11-24 (×7): 10 mg via INTRAVENOUS
  Filled 2017-11-22 (×8): qty 1

## 2017-11-22 MED ORDER — SODIUM CHLORIDE 0.9 % IV BOLUS (SEPSIS)
1000.0000 mL | Freq: Once | INTRAVENOUS | Status: AC
  Administered 2017-11-22: 1000 mL via INTRAVENOUS

## 2017-11-22 MED ORDER — VANCOMYCIN HCL IN DEXTROSE 1-5 GM/200ML-% IV SOLN
1000.0000 mg | Freq: Two times a day (BID) | INTRAVENOUS | Status: DC
  Administered 2017-11-22 – 2017-11-24 (×4): 1000 mg via INTRAVENOUS
  Filled 2017-11-22 (×4): qty 200

## 2017-11-22 MED ORDER — SODIUM CHLORIDE 0.9 % IV SOLN
2000.0000 mg | Freq: Once | INTRAVENOUS | Status: AC
  Administered 2017-11-22: 2000 mg via INTRAVENOUS
  Filled 2017-11-22: qty 2000

## 2017-11-22 MED ORDER — VANCOMYCIN HCL IN DEXTROSE 1-5 GM/200ML-% IV SOLN
1000.0000 mg | Freq: Once | INTRAVENOUS | Status: DC
  Filled 2017-11-22: qty 200

## 2017-11-22 MED ORDER — PIPERACILLIN-TAZOBACTAM 3.375 G IVPB 30 MIN
3.3750 g | Freq: Once | INTRAVENOUS | Status: AC
  Administered 2017-11-22: 3.375 g via INTRAVENOUS
  Filled 2017-11-22: qty 50

## 2017-11-22 MED ORDER — ONDANSETRON HCL 4 MG/2ML IJ SOLN
4.0000 mg | Freq: Once | INTRAMUSCULAR | Status: AC
  Administered 2017-11-22: 4 mg via INTRAVENOUS
  Filled 2017-11-22: qty 2

## 2017-11-22 MED ORDER — SODIUM CHLORIDE 0.9 % IV SOLN
2.0000 g | INTRAVENOUS | Status: DC
  Administered 2017-11-22 – 2017-11-24 (×3): 2 g via INTRAVENOUS
  Filled 2017-11-22: qty 20
  Filled 2017-11-22: qty 2
  Filled 2017-11-22: qty 20
  Filled 2017-11-22: qty 2

## 2017-11-22 MED ORDER — OXYCODONE HCL 5 MG PO TABS
5.0000 mg | ORAL_TABLET | ORAL | Status: DC | PRN
  Administered 2017-11-22 – 2017-11-24 (×7): 5 mg via ORAL
  Filled 2017-11-22 (×7): qty 1

## 2017-11-22 MED ORDER — HYDROXYCHLOROQUINE SULFATE 200 MG PO TABS
200.0000 mg | ORAL_TABLET | Freq: Every day | ORAL | Status: DC
  Administered 2017-11-22 – 2017-11-25 (×4): 200 mg via ORAL
  Filled 2017-11-22 (×4): qty 1

## 2017-11-22 MED ORDER — TRAZODONE HCL 100 MG PO TABS
100.0000 mg | ORAL_TABLET | Freq: Every evening | ORAL | Status: DC | PRN
  Administered 2017-11-22 – 2017-11-25 (×2): 100 mg via ORAL
  Filled 2017-11-22 (×2): qty 1

## 2017-11-22 MED ORDER — METHYLPREDNISOLONE SODIUM SUCC 125 MG IJ SOLR
125.0000 mg | Freq: Once | INTRAMUSCULAR | Status: AC
  Administered 2017-11-22: 125 mg via INTRAVENOUS
  Filled 2017-11-22: qty 2

## 2017-11-22 MED ORDER — AMLODIPINE BESYLATE 5 MG PO TABS
5.0000 mg | ORAL_TABLET | Freq: Every day | ORAL | Status: DC
  Administered 2017-11-22 – 2017-11-24 (×3): 5 mg via ORAL
  Filled 2017-11-22 (×3): qty 1

## 2017-11-22 MED ORDER — SODIUM CHLORIDE 0.45 % IV SOLN
INTRAVENOUS | Status: AC
  Administered 2017-11-22 – 2017-11-23 (×2): via INTRAVENOUS

## 2017-11-22 MED ORDER — MORPHINE SULFATE (PF) 4 MG/ML IV SOLN
4.0000 mg | Freq: Once | INTRAVENOUS | Status: AC
  Administered 2017-11-22: 4 mg via INTRAVENOUS
  Filled 2017-11-22: qty 1

## 2017-11-22 MED ORDER — LIP MEDEX EX OINT
TOPICAL_OINTMENT | Freq: Once | CUTANEOUS | Status: AC
  Administered 2017-11-22: 07:00:00 via TOPICAL
  Filled 2017-11-22: qty 7

## 2017-11-22 MED ORDER — PREDNISONE 20 MG PO TABS
40.0000 mg | ORAL_TABLET | Freq: Every day | ORAL | Status: DC
  Administered 2017-11-23 – 2017-11-25 (×3): 40 mg via ORAL
  Filled 2017-11-22 (×3): qty 2

## 2017-11-22 MED ORDER — DIPHENHYDRAMINE HCL 25 MG PO CAPS
25.0000 mg | ORAL_CAPSULE | Freq: Four times a day (QID) | ORAL | Status: DC | PRN
  Administered 2017-11-22: 25 mg via ORAL
  Filled 2017-11-22: qty 1

## 2017-11-22 MED ORDER — HYDROMORPHONE HCL 1 MG/ML IJ SOLN
1.0000 mg | INTRAMUSCULAR | Status: DC | PRN
  Administered 2017-11-22 – 2017-11-24 (×9): 1 mg via INTRAVENOUS
  Filled 2017-11-22 (×9): qty 1

## 2017-11-22 NOTE — ED Triage Notes (Signed)
Pt is presenting from home with c/o bilateral leg pain associated with swelling, warmness to touch and red discoloration. Known hx of recurrent skin infection, chart review endorses hx of cellulitis with hospitalization, pt reports hx of lupus.

## 2017-11-22 NOTE — Progress Notes (Signed)
Pharmacy Antibiotic Note  Linda [name] is a 22 y.o. female admitted on 11/22/2017 with cellulitis.  Pharmacy has been consulted for vancomycin dosing. Zosyn initially ordered but changed to ceftriaxone at admission.  Patient with h/o lupus and septic R knee in Dec 2018 - cx grew MSSA and appears was discharged on doxycycline.  Patient presents 3/25 with L knee/leg pain  Today, 11/22/2017  WBC elevated  AKI - mild.  Suspected dehydration  MRI knee ordered  No current weight (wt in 09/30/17 = 96.2kg)  Plan:  Vancomycin 2gm x1 then 1gm IV q12h  Ceftriaxone 2gm IV q24h  Monitor SCr  Check vancomcyin levels if remains on vancomycin > 3-4 days  Follow-up ability to narrow abx    No data recorded.  Recent Labs  Lab 11/22/17 0233 11/22/17 0246 11/22/17 0544  WBC 21.4*  --   --   CREATININE 1.20*  --   --   LATICACIDVEN  --  1.78 0.89    CrCl cannot be calculated (Unknown ideal weight.).    Allergies  Allergen Reactions  . Vicodin [Hydrocodone-Acetaminophen] Itching and Rash    Antimicrobials this admission: 3/25 zosyn x1 3/25 vanco >> 3/25 ceftriaxone >>  Dose adjustments this admission:  Microbiology results: 3/25 BCx:   Thank you for allowing pharmacy to be a part of this patient's care.  Doreene Eland, PharmD, BCPS.   Pager: [phone] 11/22/2017 9:10 AM

## 2017-11-22 NOTE — ED Notes (Signed)
Bed: WA05 Expected date:  Expected time:  Means of arrival:  Comments: 

## 2017-11-22 NOTE — ED Notes (Addendum)
Went in room due to patient calling out for IV pump beeping.  Patient crying and on phone with mother stating that she has been calling out in pain and had to vomit in floor due to no one coming in her room for past 40 minutes.  I introduced myself and explained I had just come on shift and apologized she has had such a bad experience. Informed patient that I would go speak with her doctor about getting her medications for nausea and pain medications. Made Dr Roxanne Mins aware. New orders to be placed.

## 2017-11-22 NOTE — Progress Notes (Signed)
A consult was received from an ED physician for zosyn and vancomcyin per pharmacy dosing.  The patient's profile has been reviewed for ht/wt/allergies/indication/available labs.   A one time order has been placed for Zosyn 3.375 Gm and Vancomycin 2 Gm.  Further antibiotics/pharmacy consults should be ordered by admitting physician if indicated.                       Thank you, Dorrene German 11/22/2017  4:23 AM

## 2017-11-22 NOTE — ED Notes (Signed)
Called floor to give report. Unable to give report at this time, awaiting a call back

## 2017-11-22 NOTE — ED Notes (Signed)
Pt aware urine sample is needed 

## 2017-11-22 NOTE — H&P (Addendum)
History and Physical   Linda [name] VQM:[id number] DOB: Aug 19, 1996 DOA: 11/22/2017  Referring MD/NP/PA: Roxanne Mins, MD, EDP PCP: Tonette Bihari, MD Outpatient Specialists: Gerilyn Nestle, Rheumatology [phone])  Patient coming from: Home  Chief Complaint: Left knee and leg pain  HPI: Linda [name] is a 22 y.o. female with a history of SLE and MCTD on prednisone and plaquenil, HTN, and migraine who presented to the ED due to severe pain at multiple sites, most notably the left leg and knee over the past 3-4 days. The pain grew worse gradually over this time and became associated with NBNB emesis over the past 24 hours. She took nothing for the pain as nothing has helped these symptoms in the past. She also has worsening pain across the left buttock and left forearm, all of which is worse with palpation and keeping her from sleeping. There is severe pain in the right foot when walking which began gradually as well, "feels like I'm walking on my pinky toe." She denies any trauma, fever, insect or other bite/envenomation, HA, neck pain/stiffness. She had an admission in Dec 2018 for septic prepatellar bursitis requiring I&D. She took the recommended course of doxycycline following discharge but was unable to follow up with Dr. Doreatha Martin for suture removal. She removed her own sutures and thinks she may have left one in but denies drainage or right knee pain.  ED Course: Left knee XR demonstrated no joint abnormalities. WBC elevated at 21.4 without left shift. 1.5L NS given, vancomycin, and zosyn given and blood cultures drawn. Hospitalists called to admit.   Review of Systems: No vision changes, hearing changes, cough, sore throat, dyspnea, CP, wheezing, syncope, dysuria, gross hematuria, and per HPI. All others reviewed and are negative.   Past Medical History:  Diagnosis Date  . Lupus   . Migraines   . Mixed connective tissue disease [facility])    Past Surgical History:  Procedure Laterality Date  .  I&D EXTREMITY Right 08/06/2017   Procedure: IRRIGATION AND DEBRIDEMENT KNEE;  Surgeon: Shona Needles, MD;  Location: [place];  Service: Orthopedics;  Laterality: Right;  . MULTIPLE TOOTH EXTRACTIONS     - Intermittent smoker.   reports that she has been smoking cigarettes and cigars.  She started smoking about 2 years ago. She has been smoking about 0.00 packs per day. She has never used smokeless tobacco. She reports that she does not drink alcohol or use drugs. Allergies  Allergen Reactions  . Vicodin [Hydrocodone-Acetaminophen] Itching and Rash   Family History  Problem Relation Age of Onset  . Lupus Mother   . Hypertension Mother   . Kidney disease Mother   . Diabetes Maternal Grandmother    - Family history otherwise reviewed and not pertinent.  Prior to Admission medications   Medication Sig Start Date End Date Taking? Authorizing Provider  amLODipine (NORVASC) 5 MG tablet Take 5 mg by mouth daily.   Yes [provider]  hydrochlorothiazide (HYDRODIURIL) 25 MG tablet Take 1 tablet (25 mg total) by mouth daily. 09/14/17  Yes Pollina, Gwenyth Allegra, MD  hydroxychloroquine (PLAQUENIL) 200 MG tablet Take 200 mg by mouth daily.   Yes [provider]  predniSONE (DELTASONE) 5 MG tablet Take 5 mg by mouth daily with breakfast.   Yes [provider]  amLODipine (NORVASC) 10 MG tablet Take 1 tablet (10 mg total) by mouth daily. Patient not taking: Reported on 11/22/2017 09/14/17   Orpah Greek, MD  famotidine (PEPCID) 20 MG tablet Take  1 tablet (20 mg total) by mouth daily as needed for heartburn or indigestion. Patient not taking: Reported on 08/02/2017 04/14/17   Carlyle Dolly, MD  lidocaine (LMX) 4 % cream Apply topically 3 (three) times daily as needed (pain). Patient not taking: Reported on 04/20/2017 04/14/17   Carlyle Dolly, MD  lisinopril (PRINIVIL,ZESTRIL) 20 MG tablet Take 1 tablet (20 mg total) by mouth daily. Patient not taking:  Reported on 11/22/2017 09/14/17   Orpah Greek, MD  oxyCODONE (OXY IR/ROXICODONE) 5 MG immediate release tablet Take 1 tablet (5 mg total) by mouth every 6 (six) hours as needed for breakthrough pain. Patient not taking: Reported on 09/14/2017 08/09/17   Steve Rattler, DO  traMADol (ULTRAM) 50 MG tablet Take 1 tablet (50 mg total) by mouth every 6 (six) hours as needed. Patient not taking: Reported on 11/22/2017 09/14/17   Orpah Greek, MD    Physical Exam: Vitals:   11/22/17 0149 11/22/17 0422 11/22/17 0730  BP: 117/71 (!) 150/97 (!) 162/135  Pulse: 82 95 (!) 130  Resp: 16 16 17   SpO2: 96% 96% 100%   Constitutional: 22 y.o. female in evident pain, tearful.  Eyes: Lids and conjunctivae normal, PERRL ENMT: Mucous membranes are moist. Posterior pharynx clear of any exudate or lesions. Fair dentition.  Neck: Normal, supple, no masses, no thyromegaly Respiratory: Non-labored breathing room air without accessory muscle use. Clear breath sounds to auscultation bilaterally Cardiovascular: Regular tachycardia, no murmurs, rubs, or gallops. No carotid bruits. No JVD. No LE edema. + pedal pulses. Abdomen: Normoactive bowel sounds. No tenderness, non-distended, and no masses palpated. No hepatosplenomegaly. GU: No indwelling catheter Musculoskeletal: Extreme tenderness to palpation along left leg worst at the knee with inability to move joint due to pain, erythema and warmth noted. Exam limited by pain despite 1mg  dilaudid IV. Right foot without visible deformity, tender over 5th MT.  Skin: Warm, dry. Diffuse scarring. Erythema with induration along right buttock without fluctuance or wound. Right knee with scabbed surgical incision without erythema, tenderness, discharge. Left knee and leg as above.  Neurologic: CN II-XII grossly intact. Gait not assessed. Speech normal. No focal deficits in motor strength or sensation in all extremities.  Psychiatric: Alert and oriented x3. Normal  judgment and insight. Mood anxious with broad affect.   Labs on Admission: I have personally reviewed following labs and imaging studies  CBC: Recent Labs  Lab 11/22/17 0233  WBC 21.4*  NEUTROABS 14.2*  HGB 12.5  HCT 38.7  MCV 99.0  PLT 169*   Basic Metabolic Panel: Recent Labs  Lab 11/22/17 0233  NA 132*  K 4.1  CL 97*  CO2 24  GLUCOSE 79  BUN 11  CREATININE 1.20*  CALCIUM 9.0   GFR: CrCl cannot be calculated (Unknown ideal weight.). Liver Function Tests: Recent Labs  Lab 11/22/17 0233  AST 43*  ALT 16  ALKPHOS 73  BILITOT 2.3*  PROT 8.0  ALBUMIN 3.4*   Urine analysis:    Component Value Date/Time   COLORURINE YELLOW 11/22/2017 0233   APPEARANCEUR CLEAR 11/22/2017 0233   LABSPEC 1.011 11/22/2017 0233   PHURINE 6.0 11/22/2017 0233   GLUCOSEU NEGATIVE 11/22/2017 0233   HGBUR SMALL (A) 11/22/2017 0233   BILIRUBINUR NEGATIVE 11/22/2017 0233   KETONESUR NEGATIVE 11/22/2017 0233   PROTEINUR 30 (A) 11/22/2017 0233   UROBILINOGEN 1.0 03/30/2015 2328   NITRITE NEGATIVE 11/22/2017 0233   LEUKOCYTESUR TRACE (A) 11/22/2017 0233   Radiological Exams on Admission: Dg Knee  Complete 4 Views Left  Result Date: 11/22/2017 CLINICAL DATA:  Left knee pain and swelling for 3 days EXAM: LEFT KNEE - COMPLETE 4+ VIEW COMPARISON:  Left knee radiograph 12/28/2016 FINDINGS: Extensive soft tissue calcification surrounding the knee, worsened from the prior study. The joint itself is normal. No fracture or dislocation. No knee effusion. IMPRESSION: 1. No fracture, dislocation or arthropathy of the left knee. 2. Extensive soft tissue calcification, advanced compared to 12/28/2016 and compatible with diagnosis of lupus. Electronically Signed   By: Ulyses Jarred M.D.   On: 11/22/2017 03:56   Dg Foot Complete Right  Result Date: 11/22/2017 CLINICAL DATA:  22 year old female with pain over the fifth metatarsal of the right foot. No injury. EXAM: RIGHT FOOT COMPLETE - 3+ VIEW COMPARISON:   None. FINDINGS: There is no acute fracture or dislocation. No arthritic changes. There is amorphous calcification of the soft tissues of the plantar aspect adjacent to the head of the fifth metatarsal, chronic and nonspecific but may be related to underlying connective tissue disease. Clinical correlation is recommended. IMPRESSION: 1. No acute osseous pathology. 2. Amorphous soft tissue calcification along the plantar aspect of the head of the fifth metatarsal. Clinical correlation is recommended. Electronically Signed   By: Anner Crete M.D.   On: 11/22/2017 03:55    EKG: Independently reviewed. No change from prior  Assessment/Plan Principal Problem:   Cellulitis Active Problems:   Mixed connective tissue disease (HCC)   Primary hypertension   Lupus erythematosus   Left knee pain, swelling, erythema:  - Exam limited by pt's pain despite dilaudid. Cannot r/o septic arthritis w/XR in setting of leukocytosis and history of pt requiring I&D of right knee, will check MRI. Lactate reassuring.  - Given vancomycin and zosyn for cellulitis. Will continue vanc/CTX. - Monitor cultures - Morphine not effective, will continue with dilaudid  Right buttock and left forearm erythema/tenderness: Multifocal cellulitis is possible, and will be covered, though more likely explanation is flare of autoimmune disease.  - Will give increased steroids and monitor.   SLE and MCTD: High ANA titer, followed by Dr. Gerilyn Nestle and recently referred as outpatient to nephrology and dermatology early April.  - Follow up as outpatient.  - Placed call to Dr. Genevie Cheshire office, waiting to hear back.   History of right prepatellar septic bursitis s/p I&D and abx Dec 2018: Pt never followed up and removed her own sutures. No evidence of infection along surgical site, pt believes she may have a retained suture, though this was not seen on exam and her pain at time of evaluation was too severe to unroof the scab.    HTN: Elevated in ED.  - Restart home norvasc, it is unclear whether pt is taking medications as prescribed.  - Hydralazine IV prn  DVT prophylaxis: Lovenox  Code Status: Full  Family Communication: None at bedside Disposition Plan: Home when improved, depending on clinical response. Consults called:  - Attempted to call pt's rheumatologist, Dr. Nelia Shi. Have not had call returned at time of this note.  - Discussed with Silvestre Gunner, PA with orthopedics who will evaluate the patient 3/26.  Admission status: Inpatient    Vance Gather, MD [facility] Pager [phone]  If 7PM-7AM, please contact night-coverage [email or web address] Password [facility] 11/22/2017, 8:41 AM

## 2017-11-22 NOTE — ED Notes (Signed)
Hospitalist at bedside 

## 2017-11-22 NOTE — ED Notes (Signed)
Patient transported to X-ray 

## 2017-11-22 NOTE — ED Notes (Signed)
Bed: WTR7 Expected date:  Expected time:  Means of arrival:  Comments: 

## 2017-11-22 NOTE — ED Provider Notes (Signed)
[place] DEPT Provider Note   CSN: [id number] Arrival date & time: 11/22/17  0131     History   Chief Complaint Chief Complaint  Patient presents with  . Cellulitis    HPI Linda [name] is a 22 y.o. female with a hx of Lupus, mixed connective tissue disorder, HTN, migraine headaches presents to the Emergency Department complaining of gradual, persistent, progressively worsening left knee and leg pain onset 3 days ago.  Pt reports that tonight she was confused at home and therefore her mother sent her to the ER. Associated symptoms include generalized weakness, nausea and vomiting.  Pt reports persistent NBNB emesis for the last 24 hours. She reports 4-5 bouts of emesis.  She denies diarrhea.  Pt has taken ibuprofen without relief.  Movement and palpation make her pain worse.  Pt also reports right buttock pain and swelling.  Pt denies measured fever at home, neck pain, neck stiffness, chest pain, SOB, abd pain, syncope, dysuria.  Pt reports taking 10mg  of prednisone every day for the last 1 month.  Patient also complaining of right foot pain worsening over the last several days.   The history is provided by the patient and medical records. No language interpreter was used.    Past Medical History:  Diagnosis Date  . Lupus   . Migraines   . Mixed connective tissue disease [facility])     Patient Active Problem List   Diagnosis Date Noted  . Septic prepatellar bursitis of right knee 08/19/2017  . Grief reaction 08/19/2017  . Right knee pain   . Elbow pain, right   . Back pain 05/04/2017  . Bilateral knee swelling 04/20/2017  . History of connective tissue disease   . Leg swelling   . Abdominal pain   . Lupus erythematosus   . Cellulitis 04/09/2017  . Primary hypertension 12/11/2016  . Mixed connective tissue disease ([place]) 02/11/2016  . High BMI 01/19/2014  . Implanon in place 01/19/2014    Past Surgical History:  Procedure Laterality Date   . I&D EXTREMITY Right 08/06/2017   Procedure: IRRIGATION AND DEBRIDEMENT KNEE;  Surgeon: Shona Needles, MD;  Location: [place];  Service: Orthopedics;  Laterality: Right;  . MULTIPLE TOOTH EXTRACTIONS       OB History    Gravida  0   Para  0   Term  0   Preterm  0   AB  0   Living  0     SAB  0   TAB  0   Ectopic  0   Multiple  0   Live Births               Home Medications    Prior to Admission medications   Medication Sig Start Date End Date Taking? Authorizing Provider  amLODipine (NORVASC) 5 MG tablet Take 5 mg by mouth daily.   Yes [provider]  hydrochlorothiazide (HYDRODIURIL) 25 MG tablet Take 1 tablet (25 mg total) by mouth daily. 09/14/17  Yes Pollina, Gwenyth Allegra, MD  hydroxychloroquine (PLAQUENIL) 200 MG tablet Take 200 mg by mouth daily.   Yes [provider]  predniSONE (DELTASONE) 5 MG tablet Take 5 mg by mouth daily with breakfast.   Yes [provider]  amLODipine (NORVASC) 10 MG tablet Take 1 tablet (10 mg total) by mouth daily. Patient not taking: Reported on 11/22/2017 09/14/17   Orpah Greek, MD  famotidine (PEPCID) 20 MG tablet Take 1 tablet (20  mg total) by mouth daily as needed for heartburn or indigestion. Patient not taking: Reported on 08/02/2017 04/14/17   Carlyle Dolly, MD  lidocaine (LMX) 4 % cream Apply topically 3 (three) times daily as needed (pain). Patient not taking: Reported on 04/20/2017 04/14/17   Carlyle Dolly, MD  lisinopril (PRINIVIL,ZESTRIL) 20 MG tablet Take 1 tablet (20 mg total) by mouth daily. Patient not taking: Reported on 11/22/2017 09/14/17   Orpah Greek, MD  oxyCODONE (OXY IR/ROXICODONE) 5 MG immediate release tablet Take 1 tablet (5 mg total) by mouth every 6 (six) hours as needed for breakthrough pain. Patient not taking: Reported on 09/14/2017 08/09/17   Steve Rattler, DO  traMADol (ULTRAM) 50 MG tablet Take 1 tablet (50 mg total) by mouth every 6  (six) hours as needed. Patient not taking: Reported on 11/22/2017 09/14/17   Orpah Greek, MD    Family History Family History  Problem Relation Age of Onset  . Lupus Mother   . Hypertension Mother   . Kidney disease Mother   . Diabetes Maternal Grandmother     Social History Social History   Tobacco Use  . Smoking status: Current Every Day Smoker    Packs/day: 0.00    Types: Cigarettes, Cigars    Start date: 02/11/2015  . Smokeless tobacco: Never Used  Substance Use Topics  . Alcohol use: No    Alcohol/week: 0.0 oz    Comment: social  . Drug use: No     Allergies   Vicodin [hydrocodone-acetaminophen]   Review of Systems Review of Systems  Constitutional: Positive for fatigue. Negative for appetite change, diaphoresis, fever and unexpected weight change.  HENT: Negative for mouth sores.   Eyes: Negative for visual disturbance.  Respiratory: Negative for cough, chest tightness, shortness of breath and wheezing.   Cardiovascular: Negative for chest pain.  Gastrointestinal: Positive for nausea and vomiting. Negative for abdominal pain, constipation and diarrhea.  Endocrine: Negative for polydipsia, polyphagia and polyuria.  Genitourinary: Negative for dysuria, frequency, hematuria and urgency.  Musculoskeletal: Positive for arthralgias and joint swelling. Negative for back pain and neck stiffness.  Skin: Positive for color change and wound. Negative for rash.  Allergic/Immunologic: Negative for immunocompromised state.  Neurological: Negative for syncope, light-headedness and headaches.  Hematological: Does not bruise/bleed easily.  Psychiatric/Behavioral: Positive for confusion. Negative for sleep disturbance. The patient is not nervous/anxious.      Physical Exam Updated Vital Signs BP (!) 150/97   Pulse 95   Resp 16   LMP 11/19/2017   SpO2 96%   Physical Exam  Constitutional: She appears well-developed and well-nourished. She appears distressed.    Awake, alert, nontoxic appearance Patient appears uncomfortable, writhing in the bed.  HENT:  Head: Normocephalic and atraumatic.  Mouth/Throat: Oropharynx is clear and moist. Mucous membranes are dry. No oropharyngeal exudate.  Mucous membranes are very dry  Eyes: Conjunctivae are normal. No scleral icterus.  Neck: Normal range of motion. Neck supple.  Cardiovascular: Normal rate, regular rhythm and intact distal pulses.  Pulmonary/Chest: Effort normal and breath sounds normal. No respiratory distress. She has no wheezes.  Equal chest expansion  Abdominal: Soft. Bowel sounds are normal. She exhibits no mass. There is no tenderness. There is no rebound and no guarding.  Genitourinary:     Musculoskeletal: She exhibits no edema.       Left knee: She exhibits decreased range of motion, swelling, effusion and erythema. Tenderness found.  Left knee: Hot to touch, erythematous with  significantly decreased range of motion.  Swelling and questionable joint effusion noted.  No open wounds.  Erythema and increased warmth streak up to mid thigh and down to mid shin.  Full range of motion of the left hip and ankle. Right knee: Evidence of poorly healed surgical incision.  No purulent drainage noted. Right foot: Tenderness to palpation over the fifth metatarsal; no open wounds.  Neurological: She is alert.  Speech is clear and goal oriented Moves extremities without ataxia  Skin: Skin is warm and dry. She is not diaphoretic.  Psychiatric: She has a normal mood and affect.  Nursing note and vitals reviewed.    ED Treatments / Results  Labs (all labs ordered are listed, but only abnormal results are displayed) Labs Reviewed  COMPREHENSIVE METABOLIC PANEL - Abnormal; Notable for the following components:      Result Value   Sodium 132 (*)    Chloride 97 (*)    Creatinine, Ser 1.20 (*)    Albumin 3.4 (*)    AST 43 (*)    Total Bilirubin 2.3 (*)    All other components within normal limits   CBC WITH DIFFERENTIAL/PLATELET - Abnormal; Notable for the following components:   WBC 21.4 (*)    Platelets 133 (*)    Neutro Abs 14.2 (*)    Lymphs Abs 4.1 (*)    Monocytes Absolute 2.6 (*)    All other components within normal limits  URINALYSIS, COMPLETE (UACMP) WITH MICROSCOPIC - Abnormal; Notable for the following components:   Hgb urine dipstick SMALL (*)    Protein, ur 30 (*)    Leukocytes, UA TRACE (*)    Squamous Epithelial / LPF 6-30 (*)    All other components within normal limits  CULTURE, BLOOD (ROUTINE X 2)  CULTURE, BLOOD (ROUTINE X 2)  I-STAT CG4 LACTIC ACID, ED  I-STAT BETA HCG BLOOD, ED ([facility], [facility], [facility] ONLY)  I-STAT CG4 LACTIC ACID, ED    EKG EKG Interpretation  Date/Time:  Monday November 22 2017 02:18:07 EDT Ventricular Rate:  90 PR Interval:    QRS Duration: 89 QT Interval:  360 QTC Calculation: 441 R Axis:   100 Text Interpretation:  Sinus rhythm Biatrial enlargement Borderline right axis deviation Probable left ventricular hypertrophy Nonspecific T abnrm, anterolateral leads When compared with ECG of 08/06/2017, Nonspecific T wave abnormality is less pronounced Confirmed by Delora Fuel ([id number]) on 11/22/2017 2:30:32 AM   Radiology Dg Knee Complete 4 Views Left  Result Date: 11/22/2017 CLINICAL DATA:  Left knee pain and swelling for 3 days EXAM: LEFT KNEE - COMPLETE 4+ VIEW COMPARISON:  Left knee radiograph 12/28/2016 FINDINGS: Extensive soft tissue calcification surrounding the knee, worsened from the prior study. The joint itself is normal. No fracture or dislocation. No knee effusion. IMPRESSION: 1. No fracture, dislocation or arthropathy of the left knee. 2. Extensive soft tissue calcification, advanced compared to 12/28/2016 and compatible with diagnosis of lupus. Electronically Signed   By: Ulyses Jarred M.D.   On: 11/22/2017 03:56   Dg Foot Complete Right  Result Date: 11/22/2017 CLINICAL DATA:  22 year old female with pain over the fifth metatarsal of  the right foot. No injury. EXAM: RIGHT FOOT COMPLETE - 3+ VIEW COMPARISON:  None. FINDINGS: There is no acute fracture or dislocation. No arthritic changes. There is amorphous calcification of the soft tissues of the plantar aspect adjacent to the head of the fifth metatarsal, chronic and nonspecific but may be related to underlying connective tissue disease. Clinical correlation  is recommended. IMPRESSION: 1. No acute osseous pathology. 2. Amorphous soft tissue calcification along the plantar aspect of the head of the fifth metatarsal. Clinical correlation is recommended. Electronically Signed   By: Anner Crete M.D.   On: 11/22/2017 03:55    Procedures Procedures (including critical care time)  Medications Ordered in ED Medications  vancomycin (VANCOCIN) 2,000 mg in sodium chloride 0.9 % 500 mL IVPB (2,000 mg Intravenous New Bag/Given 11/22/17 0437)  lip balm (CARMEX) ointment (has no administration in time range)  sodium chloride 0.9 % bolus 1,000 mL (0 mLs Intravenous Stopped 11/22/17 0329)    And  sodium chloride 0.9 % bolus 1,000 mL (0 mLs Intravenous Stopped 11/22/17 0521)  piperacillin-tazobactam (ZOSYN) IVPB 3.375 g (0 g Intravenous Stopped 11/22/17 0501)  morphine 4 MG/ML injection 4 mg (4 mg Intravenous Given 11/22/17 0501)     Initial Impression / Assessment and Plan / ED Course  I have reviewed the triage vital signs and the nursing notes.  Pertinent labs & imaging results that were available during my care of the patient were reviewed by me and considered in my medical decision making (see chart for details).  Clinical Course as of Nov 22 536  Mon Nov 22, 2017  0304 Normal lactic acid.  Patient has received 1.5 L of fluid.  Will not give her the third liter.  Lactic Acid, Venous: 1.78 [HM]  0511 elevated  Creatinine(!): 1.20 [HM]  0511 elevated  WBC(!): 21.4 [HM]  0511 Discussed with Dr. Alcario Drought who will admit   [HM]  0536 Basophils Absolute: 0.0 [HM]    Clinical  Course User Index [HM] Muthersbaugh, Jarrett Soho, PA-C    Patient presents with history of lupus on chronic steroids.  Left knee concerning for possible septic joint versus overlying cellulitis.  Patient is afebrile (measued while I was in the room) and without tachycardia.  No evidence of sepsis however patient at increased risk for this.  Her mucous membranes are dry and she reports nausea and vomiting over the last several days.  Will give fluid bolus.  Sepsis evaluation initiated.  5:38 AM No evidence of sepsis.  Patient with significant pain.  X-ray of the knee without obvious joint effusion.  Right foot films with soft tissue calcification over the head of the fifth metatarsal.  This is likely the source of patient's pain. I personally evaluated these films.  Concern for cellulitis versus septic joint.  More likely cellulitis.  Leukocytosis noted but no left shift.  Patient will be given antibiotics and admitted for further evaluation and monitoring.  The patient was discussed with and seen by Dr. Roxanne Mins who agrees with the treatment plan.   Final Clinical Impressions(s) / ED Diagnoses   Final diagnoses:  Cellulitis of left lower extremity  Cellulitis of buttock  Lupus erythematosus, unspecified form    ED Discharge Orders    None       Loni Muse Gwenlyn Perking 76/22/63 [id number]    Delora Fuel, MD 56/25/63 [phone]

## 2017-11-22 NOTE — ED Notes (Signed)
Bed: WA09 Expected date:  Expected time:  Means of arrival:  Comments: Room 5

## 2017-11-23 DIAGNOSIS — L03317 Cellulitis of buttock: Secondary | ICD-10-CM

## 2017-11-23 LAB — MRSA PCR SCREENING: MRSA by PCR: NEGATIVE

## 2017-11-23 LAB — CBC
HCT: 34.2 % — ABNORMAL LOW (ref 36.0–46.0)
Hemoglobin: 11.2 g/dL — ABNORMAL LOW (ref 12.0–15.0)
MCH: 31.7 pg (ref 26.0–34.0)
MCHC: 32.7 g/dL (ref 30.0–36.0)
MCV: 96.9 fL (ref 78.0–100.0)
Platelets: 145 10*3/uL — ABNORMAL LOW (ref 150–400)
RBC: 3.53 MIL/uL — ABNORMAL LOW (ref 3.87–5.11)
RDW: 13.8 % (ref 11.5–15.5)
WBC: 24.8 10*3/uL — ABNORMAL HIGH (ref 4.0–10.5)

## 2017-11-23 LAB — BASIC METABOLIC PANEL
Anion gap: 8 (ref 5–15)
BUN: 9 mg/dL (ref 6–20)
CO2: 25 mmol/L (ref 22–32)
Calcium: 9.1 mg/dL (ref 8.9–10.3)
Chloride: 104 mmol/L (ref 101–111)
Creatinine, Ser: 0.84 mg/dL (ref 0.44–1.00)
GFR calc Af Amer: >60 mL/min (ref >60)
GFR calc non Af Amer: >60 mL/min (ref >60)
Glucose, Bld: 119 mg/dL — ABNORMAL HIGH (ref 65–99)
Potassium: 3.8 mmol/L (ref 3.5–5.1)
Sodium: 137 mmol/L (ref 135–145)

## 2017-11-23 MED ORDER — BUTALBITAL-APAP-CAFFEINE 50-325-40 MG PO TABS
2.0000 | ORAL_TABLET | Freq: Four times a day (QID) | ORAL | Status: DC | PRN
  Administered 2017-11-23: 2 via ORAL
  Filled 2017-11-23: qty 2

## 2017-11-23 NOTE — Progress Notes (Signed)
PT Cancellation Note  Patient Details Name: Linda [name] MRN: [id number] DOB: 03/08/1996   Cancelled Treatment:    Reason Eval/Treat Not Completed: Attempted PT eval-pt refused participation at this time. Will check back another day.    Weston Anna, MPT Pager: [phone]

## 2017-11-23 NOTE — Consult Note (Signed)
Reason for Consult:Left knee infection Referring Physician: [name] is an 22 y.o. female.  HPI: Linda [name] is well known to Linda [name] after she underwent I&D of a septic right knee arthritis. She was unable to make her f/u appointment and eventually took her sutures out when the skin began to grow over them. She is certain she missed at least one. From time to time she'll develop a sore that will drain purulent malodorous material and then heal up. She came in to the hospital though for left knee pain of about 5d duration now associated with erythema. It is feeling better today. MRI showed cellulitis and some infrapatellar bursal inflammation but no fluid collection or effusion.  Past Medical History:  Diagnosis Date  . Lupus   . Migraines   . Mixed connective tissue disease [facility])     Past Surgical History:  Procedure Laterality Date  . I&D EXTREMITY Right 08/06/2017   Procedure: IRRIGATION AND DEBRIDEMENT KNEE;  Surgeon: Shona Needles, MD;  Location: [place];  Service: Orthopedics;  Laterality: Right;  . MULTIPLE TOOTH EXTRACTIONS      Family History  Problem Relation Age of Onset  . Lupus Mother   . Hypertension Mother   . Kidney disease Mother   . Diabetes Maternal Grandmother     Social History:  reports that she has been smoking cigarettes and cigars.  She started smoking about 2 years ago. She has been smoking about 0.00 packs per day. She has never used smokeless tobacco. She reports that she does not drink alcohol or use drugs.  Allergies:  Allergies  Allergen Reactions  . Vicodin [Hydrocodone-Acetaminophen] Itching and Rash    Medications: I have reviewed the patient's current medications.  Results for orders placed or performed during the hospital encounter of 11/22/17 (from the past 48 hour(s))  Comprehensive metabolic panel     Status: Abnormal   Collection Time: 11/22/17  2:33 AM  Result Value Ref Range   Sodium 132 (L) 135 - 145 mmol/L    Potassium 4.1 3.5 - 5.1 mmol/L   Chloride 97 (L) 101 - 111 mmol/L   CO2 24 22 - 32 mmol/L   Glucose, Bld 79 65 - 99 mg/dL   BUN 11 6 - 20 mg/dL   Creatinine, Ser 1.20 (H) 0.44 - 1.00 mg/dL   Calcium 9.0 8.9 - 10.3 mg/dL   Total Protein 8.0 6.5 - 8.1 g/dL   Albumin 3.4 (L) 3.5 - 5.0 g/dL   AST 43 (H) 15 - 41 U/L   ALT 16 14 - 54 U/L   Alkaline Phosphatase 73 38 - 126 U/L   Total Bilirubin 2.3 (H) 0.3 - 1.2 mg/dL   GFR calc non Af Amer >60 >60 mL/min   GFR calc Af Amer >60 >60 mL/min    Comment: (NOTE) The eGFR has been calculated using the CKD EPI equation. This calculation has not been validated in all clinical situations. eGFR's persistently <60 mL/min signify possible Chronic Kidney Disease.    Anion gap 11 5 - 15    Comment: Performed at [facility], [place] [address], [place], [place] [address]  CBC WITH DIFFERENTIAL     Status: Abnormal   Collection Time: 11/22/17  2:33 AM  Result Value Ref Range   WBC 21.4 (H) 4.0 - 10.5 K/uL   RBC 3.91 3.87 - 5.11 MIL/uL   Hemoglobin 12.5 12.0 - 15.0 g/dL   HCT 38.7 36.0 - 46.0 %  MCV 99.0 78.0 - 100.0 fL   MCH 32.0 26.0 - 34.0 pg   MCHC 32.3 30.0 - 36.0 g/dL   RDW 14.3 11.5 - 15.5 %   Platelets 133 (L) 150 - 400 K/uL   Neutrophils Relative % 67 %   Neutro Abs 14.2 (H) 1.7 - 7.7 K/uL   Lymphocytes Relative 19 %   Lymphs Abs 4.1 (H) 0.7 - 4.0 K/uL   Monocytes Relative 12 %   Monocytes Absolute 2.6 (H) 0.1 - 1.0 K/uL   Eosinophils Relative 2 %   Eosinophils Absolute 0.4 0.0 - 0.7 K/uL   Basophils Relative 0 %   Basophils Absolute 0.0 0.0 - 0.1 K/uL    Comment: Performed at [facility], [place] [address], [place], [place] [address]  Blood Culture (routine x 2)     Status: None (Preliminary result)   Collection Time: 11/22/17  2:33 AM  Result Value Ref Range   Specimen Description      RIGHT ANTECUBITAL Performed at [facility] [address] [place], [place] [address]     Special Requests      BOTTLES DRAWN AEROBIC AND ANAEROBIC Blood Culture results may not be optimal due to an inadequate volume of blood received in culture bottles Performed at [facility], [place] [address], [place], [place] [address]    Culture      NO GROWTH 1 DAY Performed at [facility] [address], [place], [place] [address]    Report Status PENDING   Urinalysis, Complete w Microscopic     Status: Abnormal   Collection Time: 11/22/17  2:33 AM  Result Value Ref Range   Color, Urine YELLOW YELLOW   APPearance CLEAR CLEAR   Specific Gravity, Urine 1.011 1.005 - 1.030   pH 6.0 5.0 - 8.0   Glucose, UA NEGATIVE NEGATIVE mg/dL   Hgb urine dipstick SMALL (A) NEGATIVE   Bilirubin Urine NEGATIVE NEGATIVE   Ketones, ur NEGATIVE NEGATIVE mg/dL   Protein, ur 30 (A) NEGATIVE mg/dL   Nitrite NEGATIVE NEGATIVE   Leukocytes, UA TRACE (A) NEGATIVE   RBC / HPF 0-5 0 - 5 RBC/hpf   WBC, UA 0-5 0 - 5 WBC/hpf   Bacteria, UA NONE SEEN NONE SEEN   Squamous Epithelial / LPF 6-30 (A) NONE SEEN    Comment: Performed at [facility], [place] [address], [place], [place] [address]  I-Stat Beta hCG blood, ED ([facility], [facility], [facility] only)     Status: None   Collection Time: 11/22/17  2:45 AM  Result Value Ref Range   I-stat hCG, quantitative <5.0 <5 mIU/mL   Comment 3            Comment:   GEST. AGE      CONC.  (mIU/mL)   <=1 WEEK        5 - 50     2 WEEKS       50 - 500     3 WEEKS       100 - 10,000     4 WEEKS     1,000 - 30,000        FEMALE AND NON-PREGNANT FEMALE:     LESS THAN 5 mIU/mL   I-Stat CG4 Lactic Acid, ED  (not at  [facility])     Status: None   Collection Time: 11/22/17  2:46 AM  Result Value Ref Range   Lactic Acid, Venous 1.78 0.5 - 1.9  mmol/L  I-Stat CG4 Lactic Acid, ED  (not at  [facility])     Status: None   Collection Time: 11/22/17  5:44 AM  Result Value Ref Range   Lactic Acid, Venous 0.89 0.5 - 1.9 mmol/L  Basic metabolic panel     Status: Abnormal    Collection Time: 11/23/17  7:02 AM  Result Value Ref Range   Sodium 137 135 - 145 mmol/L   Potassium 3.8 3.5 - 5.1 mmol/L   Chloride 104 101 - 111 mmol/L   CO2 25 22 - 32 mmol/L   Glucose, Bld 119 (H) 65 - 99 mg/dL   BUN 9 6 - 20 mg/dL   Creatinine, Ser 0.84 0.44 - 1.00 mg/dL   Calcium 9.1 8.9 - 10.3 mg/dL   GFR calc non Af Amer >60 >60 mL/min   GFR calc Af Amer >60 >60 mL/min    Comment: (NOTE) The eGFR has been calculated using the CKD EPI equation. This calculation has not been validated in all clinical situations. eGFR's persistently <60 mL/min signify possible Chronic Kidney Disease.    Anion gap 8 5 - 15    Comment: Performed at [facility], [place] [address] [place], [place] [address]  CBC     Status: Abnormal   Collection Time: 11/23/17  7:02 AM  Result Value Ref Range   WBC 24.8 (H) 4.0 - 10.5 K/uL   RBC 3.53 (L) 3.87 - 5.11 MIL/uL   Hemoglobin 11.2 (L) 12.0 - 15.0 g/dL   HCT 34.2 (L) 36.0 - 46.0 %   MCV 96.9 78.0 - 100.0 fL   MCH 31.7 26.0 - 34.0 pg   MCHC 32.7 30.0 - 36.0 g/dL   RDW 13.8 11.5 - 15.5 %   Platelets 145 (L) 150 - 400 K/uL    Comment: Performed at [facility], [place] [address], [place], [place] [address]  MRSA PCR Screening     Status: None   Collection Time: 11/23/17 10:31 AM  Result Value Ref Range   MRSA by PCR NEGATIVE NEGATIVE    Comment:        The GeneXpert MRSA Assay (FDA approved for NASAL specimens only), is one component of a comprehensive MRSA colonization surveillance program. It is not intended to diagnose MRSA infection nor to guide or monitor treatment for MRSA infections. Performed at [facility], [place] [address] [place], [place] [address]     Mr Knee Left W Wo Contrast  Result Date: 11/22/2017 CLINICAL DATA:  Left knee pain and swelling for 3 days EXAM: MRI OF THE LEFT KNEE WITHOUT AND WITH CONTRAST TECHNIQUE: Multiplanar, multisequence MR imaging of the knee was  performed before and after the administration of intravenous contrast. CONTRAST:  36m MULTIHANCE GADOBENATE DIMEGLUMINE 529 MG/ML IV SOLN COMPARISON:  None. FINDINGS: MENISCI Medial meniscus:  Intact. Lateral meniscus:  Intact. LIGAMENTS Cruciates:  Intact ACL and PCL. Collaterals: Medial collateral ligament is intact. Lateral collateral ligament complex is intact. CARTILAGE Patellofemoral:  No chondral defect. Medial: Mild partial-thickness cartilage loss of the medial femorotibial compartment. Lateral:  No chondral defect. Joint: No joint effusion. Normal Hoffa's fat. No plical thickening. Small amount of fluid in the deep infrapatellar bursa. Popliteal Fossa:  No Baker's cyst. Intact popliteus tendon. Extensor Mechanism: Intact quadriceps tendon. Intact patellar tendon. Intact medial patellar retinaculum. Intact lateral patellar retinaculum. Intact MPFL. Bones: No focal marrow signal abnormality. No fracture or dislocation. Other: Soft tissue edema enhancement involving the anterior subcutaneous fat most  concerning for cellulitis. No focal fluid collection within the subcutaneous fat to suggest abscess. Multiple soft tissue calcifications within the subcutaneous fat. IMPRESSION: 1. Cellulitis along the anterior aspect of the knee. Mild deep infrapatellar bursitis which may be secondary to an inflammatory infectious etiology. Electronically Signed   By: Kathreen Devoid   On: 11/22/2017 14:44   Dg Knee Complete 4 Views Left  Result Date: 11/22/2017 CLINICAL DATA:  Left knee pain and swelling for 3 days EXAM: LEFT KNEE - COMPLETE 4+ VIEW COMPARISON:  Left knee radiograph 12/28/2016 FINDINGS: Extensive soft tissue calcification surrounding the knee, worsened from the prior study. The joint itself is normal. No fracture or dislocation. No knee effusion. IMPRESSION: 1. No fracture, dislocation or arthropathy of the left knee. 2. Extensive soft tissue calcification, advanced compared to 12/28/2016 and compatible with  diagnosis of lupus. Electronically Signed   By: Ulyses Jarred M.D.   On: 11/22/2017 03:56   Dg Foot Complete Right  Result Date: 11/22/2017 CLINICAL DATA:  22 year old female with pain over the fifth metatarsal of the right foot. No injury. EXAM: RIGHT FOOT COMPLETE - 3+ VIEW COMPARISON:  None. FINDINGS: There is no acute fracture or dislocation. No arthritic changes. There is amorphous calcification of the soft tissues of the plantar aspect adjacent to the head of the fifth metatarsal, chronic and nonspecific but may be related to underlying connective tissue disease. Clinical correlation is recommended. IMPRESSION: 1. No acute osseous pathology. 2. Amorphous soft tissue calcification along the plantar aspect of the head of the fifth metatarsal. Clinical correlation is recommended. Electronically Signed   By: Anner Crete M.D.   On: 11/22/2017 03:55    Review of Systems  Constitutional: Negative for chills, fever and weight loss.  HENT: Negative for ear discharge, ear pain, hearing loss and tinnitus.   Eyes: Negative for blurred vision, double vision, photophobia and pain.  Respiratory: Negative for cough, sputum production and shortness of breath.   Cardiovascular: Negative for chest pain.  Gastrointestinal: Negative for abdominal pain, nausea and vomiting.  Genitourinary: Negative for dysuria, flank pain, frequency and urgency.  Musculoskeletal: Positive for joint pain (Left knee). Negative for back pain, falls, myalgias and neck pain.  Neurological: Negative for dizziness, tingling, sensory change, focal weakness, loss of consciousness and headaches.  Endo/Heme/Allergies: Does not bruise/bleed easily.  Psychiatric/Behavioral: Negative for depression, memory loss and substance abuse. The patient is not nervous/anxious.    Blood pressure (!) 173/109, pulse 100, temperature 97.9 F (36.6 C), temperature source Oral, resp. rate 19, height '5\' 6"'$  (1.676 m), weight 100 kg (220 lb 7.4 oz), last  menstrual period 11/19/2017, SpO2 100 %. Physical Exam  Constitutional: She appears well-developed and well-nourished. No distress.  HENT:  Head: Normocephalic and atraumatic.  Eyes: Conjunctivae are normal. Right eye exhibits no discharge. Left eye exhibits no discharge. No scleral icterus.  Neck: Normal range of motion.  Cardiovascular: Normal rate and regular rhythm.  Respiratory: Effort normal. No respiratory distress.  Musculoskeletal:  RLE No traumatic wounds, ecchymosis, or rash  Knee incision well healed, mod excess scar tissue noted, no obvious retained sutures  No knee or ankle effusion  Knee stable to varus/ valgus and anterior/posterior stress  Sens DPN, SPN, TN intact  Motor EHL, ext, flex, evers 5/5  DP 2+, PT 2+, No significant edema  LLE No traumatic wounds, ecchymosis, or rash  Mild erythema knee, mod TTP, esp anterior and inferior to patella, ROM 180 - 80  No knee or ankle effusion  Sens DPN, SPN, TN intact  Motor EHL, ext, flex, evers 5/5  DP 2+, PT 2+, No significant edema  Neurological: She is alert.  Skin: Skin is warm and dry. She is not diaphoretic.  Psychiatric: She has a normal mood and affect. Her behavior is normal.    Assessment/Plan: Left knee cellulitis -- No need for orthopedic intervention as she is improving on abx. Right knee s/p I&D -- It sounds very much like she's developing suture abscesses so I believe she has at least one retained suture. I reassured her that this problem is usually self-limiting though may take months or years to resolve. If, during one of these episodes she can see the suture she should call the office and we can get her in and remove it.     Lisette Abu, PA-C Orthopedic Surgery [phone] 11/23/2017, 3:17 PM

## 2017-11-23 NOTE — Progress Notes (Addendum)
PROGRESS NOTE [facility]   Linda [name]   QQP:[id number] DOB: 1996-04-26  DOA: 11/22/2017 PCP: Linda Bihari, MD   Brief Narrative:  Linda [name] 22-year-old female with medical history of SLE and MCTD prednisone and Plaquenil, hypertension and migraine who presented to the emergency department complaining of pain on multiple including site left leg, left knee and left buttocks.  In the ED was found to have swollen left knee, with elevated WBC and x-ray shows no joint abnormality.  Creatinine also was noted to be elevated. Patient was admitted with working diagnosis of cellulitis of the left knee.  MRI was ordered and show cellulitis with underlying infrapatellar bursitis with possible infectious etiology.  Orthopedic surgery has been consulted.  Off note Patient had an admission in December 2018 for septic prepatellar bursitis requiring I&D, she completed course of antibiotic with doxycycline but did not follow-up with Linda [name] for suture removal, she remove her own sutures something that she might left one.  In the knee there is incision with small open that sometimes drain.  Subjective: Patient seen and examined, she reported feeling better, however her knee still very tender and unable to move well.  Erythema has improved swelling remains about the same.  No other concerns.  Assessment & Plan: Left knee cellulitis/ ?  Multifocal left buttock, left forearm MRI of the knee shows cellulitis along the anterior aspect of the knee mild deep infrapatellar bursitis w which may be secondary to inflammatory infectious etiology Knee remains swollen, orthopedic surgery has been consulted.  No signs of joint effusion. Will continue Rocephin and vancomycin for now, obtain MRSA screening and HIV This could be also related to flare of  AI disease, steroid has been increased and will continue for now Follow-up blood cultures Continue supportive treatment   SLE/mixed connective  tissue disease High ANA titers Continue prednisone 40 mg for now Follows with Dr Linda [name] as outpatient  Continue to monitor   Hypertension BP elevated Continue Norvasc and hydralazine as needed Monitor BP  AKI Prerenal Treated with IV fluid Creatinine improved Monitor BMP in a.m.  DVT prophylaxis: Lovenox Code Status: Full code Family Communication: None at bedside Disposition Plan: Home in the next 24-48 hours if symptoms improving  Consultants:   Orthopedic surgery  Procedures:   None  Antimicrobials: Anti-infectives (From admission, onward)   Start     Dose/Rate Route Frequency Ordered Stop   11/22/17 1600  vancomycin (VANCOCIN) IVPB 1000 mg/200 mL premix     1,000 mg 200 mL/hr over 60 Minutes Intravenous Every 12 hours 11/22/17 0916     11/22/17 1100  hydroxychloroquine (PLAQUENIL) tablet 200 mg     200 mg Oral Daily 11/22/17 0955     11/22/17 1000  cefTRIAXone (ROCEPHIN) 2 g in sodium chloride 0.9 % 100 mL IVPB     2 g 200 mL/hr over 30 Minutes Intravenous Every 24 hours 11/22/17 0900     11/22/17 0430  vancomycin (VANCOCIN) 2,000 mg in sodium chloride 0.9 % 500 mL IVPB     2,000 mg 250 mL/hr over 120 Minutes Intravenous  Once 11/22/17 0415 11/22/17 0637   11/22/17 0415  piperacillin-tazobactam (ZOSYN) IVPB 3.375 g     3.375 g 100 mL/hr over 30 Minutes Intravenous  Once 11/22/17 0405 11/22/17 0501   11/22/17 0415  vancomycin (VANCOCIN) IVPB 1000 mg/200 mL premix  Status:  Discontinued     1,000 mg 200 mL/hr over 60 Minutes Intravenous  Once 11/22/17 0405 11/22/17 0415  Objective: Vitals:   11/22/17 2104 11/22/17 2218 11/23/17 0430 11/23/17 1235  BP: (!) 178/119 (!) 156/106 (!) 156/115 (!) 163/115  Pulse: (!) 109  91 96  Resp: 18  19   Temp: 98 F (36.7 C)  (!) 97.5 F (36.4 C)   TempSrc: Oral  Oral   SpO2: 92%  94%   Weight:      Height:        Intake/Output Summary (Last 24 hours) at 11/23/2017 1410 Last data filed at 11/23/2017  0600 Gross per 24 hour  Intake 3408.33 ml  Output 2300 ml  Net 1108.33 ml   Filed Weights   11/22/17 1135  Weight: 100 kg (220 lb 7.4 oz)    Examination:  General exam: Appears calm and comfortable  Respiratory system: Clear to auscultation. No wheezes,crackle or rhonchi Cardiovascular system: S1S2 heard, RRR. No JVD, murmurs, rubs or gallops Gastrointestinal system: Abdomen is nondistended, soft and nontender. Central nervous system: Alert and oriented. No focal neurological deficits. Extremities: Right knee, mid incision scar with small opening, indurated, no sutures left in place.  Good range of movement.  Left knee significantly swollen, erythematous warm and tender to palpation decreased range of motion.  Skin: Diffused scarring  Psychiatry: Mood & affect appropriate.   Data Reviewed: I have personally reviewed following labs and imaging studies  CBC: Recent Labs  Lab 11/22/17 0233 11/23/17 0702  WBC 21.4* 24.8*  NEUTROABS 14.2*  --   HGB 12.5 11.2*  HCT 38.7 34.2*  MCV 99.0 96.9  PLT 133* 735*   Basic Metabolic Panel: Recent Labs  Lab 11/22/17 0233 11/23/17 0702  NA 132* 137  K 4.1 3.8  CL 97* 104  CO2 24 25  GLUCOSE 79 119*  BUN 11 9  CREATININE 1.20* 0.84  CALCIUM 9.0 9.1   GFR: Estimated Creatinine Clearance: 125.4 mL/min (by C-G formula based on SCr of 0.84 mg/dL). Liver Function Tests: Recent Labs  Lab 11/22/17 0233  AST 43*  ALT 16  ALKPHOS 73  BILITOT 2.3*  PROT 8.0  ALBUMIN 3.4*   No results for input(s): LIPASE, AMYLASE in the last 168 hours. No results for input(s): AMMONIA in the last 168 hours. Coagulation Profile: No results for input(s): INR, PROTIME in the last 168 hours. Cardiac Enzymes: No results for input(s): CKTOTAL, CKMB, CKMBINDEX, TROPONINI in the last 168 hours. BNP (last 3 results) No results for input(s): PROBNP in the last 8760 hours. HbA1C: No results for input(s): HGBA1C in the last 72 hours. CBG: No results  for input(s): GLUCAP in the last 168 hours. Lipid Profile: No results for input(s): CHOL, HDL, LDLCALC, TRIG, CHOLHDL, LDLDIRECT in the last 72 hours. Thyroid Function Tests: No results for input(s): TSH, T4TOTAL, FREET4, T3FREE, THYROIDAB in the last 72 hours. Anemia Panel: No results for input(s): VITAMINB12, FOLATE, FERRITIN, TIBC, IRON, RETICCTPCT in the last 72 hours. Sepsis Labs: Recent Labs  Lab 11/22/17 0246 11/22/17 0544  LATICACIDVEN 1.78 0.89    Recent Results (from the past 240 hour(s))  Blood Culture (routine x 2)     Status: None (Preliminary result)   Collection Time: 11/22/17  2:33 AM  Result Value Ref Range Status   Specimen Description   Final    RIGHT ANTECUBITAL Performed at [facility] [address], [place], [place] [address]    Special Requests   Final    BOTTLES DRAWN AEROBIC AND ANAEROBIC Blood Culture results may not be optimal due to an inadequate volume of  blood received in culture bottles Performed at [facility], [place] [address], [place], [place] [address]    Culture   Final    NO GROWTH 1 DAY Performed at [facility], [place] [address] [place], [place] [address]    Report Status PENDING  Incomplete  MRSA PCR Screening     Status: None   Collection Time: 11/23/17 10:31 AM  Result Value Ref Range Status   MRSA by PCR NEGATIVE NEGATIVE Final    Comment:        The GeneXpert MRSA Assay (FDA approved for NASAL specimens only), is one component of a comprehensive MRSA colonization surveillance program. It is not intended to diagnose MRSA infection nor to guide or monitor treatment for MRSA infections. Performed at [facility], [place] [address], [place], [place] [address]       Radiology Studies: Mr Knee Left W Wo Contrast  Result Date: 11/22/2017 CLINICAL DATA:  Left knee pain and swelling for 3 days EXAM: MRI OF THE LEFT KNEE WITHOUT AND WITH CONTRAST TECHNIQUE: Multiplanar,  multisequence MR imaging of the knee was performed before and after the administration of intravenous contrast. CONTRAST:  54mL MULTIHANCE GADOBENATE DIMEGLUMINE 529 MG/ML IV SOLN COMPARISON:  None. FINDINGS: MENISCI Medial meniscus:  Intact. Lateral meniscus:  Intact. LIGAMENTS Cruciates:  Intact ACL and PCL. Collaterals: Medial collateral ligament is intact. Lateral collateral ligament complex is intact. CARTILAGE Patellofemoral:  No chondral defect. Medial: Mild partial-thickness cartilage loss of the medial femorotibial compartment. Lateral:  No chondral defect. Joint: No joint effusion. Normal Hoffa's fat. No plical thickening. Small amount of fluid in the deep infrapatellar bursa. Popliteal Fossa:  No Baker's cyst. Intact popliteus tendon. Extensor Mechanism: Intact quadriceps tendon. Intact patellar tendon. Intact medial patellar retinaculum. Intact lateral patellar retinaculum. Intact MPFL. Bones: No focal marrow signal abnormality. No fracture or dislocation. Other: Soft tissue edema enhancement involving the anterior subcutaneous fat most concerning for cellulitis. No focal fluid collection within the subcutaneous fat to suggest abscess. Multiple soft tissue calcifications within the subcutaneous fat. IMPRESSION: 1. Cellulitis along the anterior aspect of the knee. Mild deep infrapatellar bursitis which may be secondary to an inflammatory infectious etiology. Electronically Signed   By: Kathreen Devoid   On: 11/22/2017 14:44   Dg Knee Complete 4 Views Left  Result Date: 11/22/2017 CLINICAL DATA:  Left knee pain and swelling for 3 days EXAM: LEFT KNEE - COMPLETE 4+ VIEW COMPARISON:  Left knee radiograph 12/28/2016 FINDINGS: Extensive soft tissue calcification surrounding the knee, worsened from the prior study. The joint itself is normal. No fracture or dislocation. No knee effusion. IMPRESSION: 1. No fracture, dislocation or arthropathy of the left knee. 2. Extensive soft tissue calcification, advanced  compared to 12/28/2016 and compatible with diagnosis of lupus. Electronically Signed   By: Ulyses Jarred M.D.   On: 11/22/2017 03:56   Dg Foot Complete Right  Result Date: 11/22/2017 CLINICAL DATA:  22 year old female with pain over the fifth metatarsal of the right foot. No injury. EXAM: RIGHT FOOT COMPLETE - 3+ VIEW COMPARISON:  None. FINDINGS: There is no acute fracture or dislocation. No arthritic changes. There is amorphous calcification of the soft tissues of the plantar aspect adjacent to the head of the fifth metatarsal, chronic and nonspecific but may be related to underlying connective tissue disease. Clinical correlation is recommended. IMPRESSION: 1. No acute osseous pathology. 2. Amorphous soft tissue calcification along the plantar aspect of the head of the fifth metatarsal. Clinical  correlation is recommended. Electronically Signed   By: Anner Crete M.D.   On: 11/22/2017 03:55    Scheduled Meds: . amLODipine  5 mg Oral Daily  . enoxaparin (LOVENOX) injection  50 mg Subcutaneous Q24H  . hydroxychloroquine  200 mg Oral Daily  . predniSONE  40 mg Oral Q breakfast   Continuous Infusions: . cefTRIAXone (ROCEPHIN)  IV 2 g (11/23/17 0857)  . vancomycin Stopped (11/23/17 0457)     LOS: 1 day    Time spent: Total of 25 minutes spent with pt, greater than 50% of which was spent in discussion of  treatment, counseling and coordination of care   Chipper Oman, MD Pager: Text Page via [email or web address]   If 7PM-7AM, please contact night-coverage [email or web address] 11/23/2017, 2:10 PM   Note - This record has been created using [employer]. Chart creation errors have been sought, but may not always have been located. Such creation errors do not reflect on the standard of medical care.

## 2017-11-24 DIAGNOSIS — I10 Essential (primary) hypertension: Secondary | ICD-10-CM

## 2017-11-24 DIAGNOSIS — L930 Discoid lupus erythematosus: Secondary | ICD-10-CM

## 2017-11-24 DIAGNOSIS — L03116 Cellulitis of left lower limb: Principal | ICD-10-CM

## 2017-11-24 LAB — CBC WITH DIFFERENTIAL/PLATELET
Basophils Absolute: 0 10*3/uL (ref 0.0–0.1)
Basophils Relative: 0 %
Eosinophils Absolute: 0.1 10*3/uL (ref 0.0–0.7)
Eosinophils Relative: 1 %
HCT: 33.3 % — ABNORMAL LOW (ref 36.0–46.0)
Hemoglobin: 10.6 g/dL — ABNORMAL LOW (ref 12.0–15.0)
Lymphocytes Relative: 18 %
Lymphs Abs: 4.1 10*3/uL — ABNORMAL HIGH (ref 0.7–4.0)
MCH: 31.5 pg (ref 26.0–34.0)
MCHC: 31.8 g/dL (ref 30.0–36.0)
MCV: 99.1 fL (ref 78.0–100.0)
Monocytes Absolute: 1.5 10*3/uL — ABNORMAL HIGH (ref 0.1–1.0)
Monocytes Relative: 6 %
Neutro Abs: 17.8 10*3/uL — ABNORMAL HIGH (ref 1.7–7.7)
Neutrophils Relative %: 75 %
Platelets: 182 10*3/uL (ref 150–400)
RBC: 3.36 MIL/uL — ABNORMAL LOW (ref 3.87–5.11)
RDW: 14.5 % (ref 11.5–15.5)
WBC: 23.6 10*3/uL — ABNORMAL HIGH (ref 4.0–10.5)

## 2017-11-24 LAB — HIV ANTIBODY (ROUTINE TESTING W REFLEX): HIV Screen 4th Generation wRfx: NONREACTIVE

## 2017-11-24 LAB — CREATININE, SERUM
Creatinine, Ser: 0.85 mg/dL (ref 0.44–1.00)
GFR calc Af Amer: >60 mL/min (ref >60)
GFR calc non Af Amer: >60 mL/min (ref >60)

## 2017-11-24 MED ORDER — AMLODIPINE BESYLATE 5 MG PO TABS
5.0000 mg | ORAL_TABLET | ORAL | Status: AC
  Administered 2017-11-24: 5 mg via ORAL
  Filled 2017-11-24: qty 1

## 2017-11-24 MED ORDER — KETOROLAC TROMETHAMINE 30 MG/ML IJ SOLN
15.0000 mg | Freq: Four times a day (QID) | INTRAMUSCULAR | Status: DC
  Administered 2017-11-24 – 2017-11-25 (×3): 15 mg via INTRAVENOUS
  Filled 2017-11-24 (×4): qty 1

## 2017-11-24 MED ORDER — ENOXAPARIN SODIUM 40 MG/0.4ML ~~LOC~~ SOLN
40.0000 mg | SUBCUTANEOUS | Status: DC
  Administered 2017-11-24: 40 mg via SUBCUTANEOUS
  Filled 2017-11-24: qty 0.4

## 2017-11-24 MED ORDER — AMLODIPINE BESYLATE 10 MG PO TABS
10.0000 mg | ORAL_TABLET | Freq: Every day | ORAL | Status: DC
  Administered 2017-11-25: 10 mg via ORAL
  Filled 2017-11-24: qty 1

## 2017-11-24 MED ORDER — VANCOMYCIN HCL 10 G IV SOLR
1500.0000 mg | Freq: Two times a day (BID) | INTRAVENOUS | Status: DC
  Administered 2017-11-24 – 2017-11-25 (×2): 1500 mg via INTRAVENOUS
  Filled 2017-11-24 (×2): qty 1500

## 2017-11-24 MED ORDER — TIZANIDINE HCL 4 MG PO TABS
2.0000 mg | ORAL_TABLET | Freq: Three times a day (TID) | ORAL | Status: DC
Start: 2017-11-24 — End: 2017-11-25
  Administered 2017-11-24 – 2017-11-25 (×4): 2 mg via ORAL
  Filled 2017-11-24 (×4): qty 1

## 2017-11-24 MED ORDER — HYDROMORPHONE HCL 1 MG/ML IJ SOLN: 1.0000 mg | INTRAMUSCULAR | Status: DC | PRN

## 2017-11-24 MED ORDER — SENNA 8.6 MG PO TABS
1.0000 | ORAL_TABLET | Freq: Every day | ORAL | Status: DC
  Administered 2017-11-24: 8.6 mg via ORAL
  Filled 2017-11-24: qty 1

## 2017-11-24 NOTE — Progress Notes (Signed)
PROGRESS NOTE    Linda [name]  GEZ:[id number] DOB: Jan 25, 1996 DOA: 11/22/2017 PCP: Tonette Bihari, MD   Brief Narrative: 22 year-old female with medical history of SLE and MCTD prednisone and Plaquenil, hypertension and migraine who presented to the emergency department complaining of pain on multiple including site left leg, left knee and left buttocks.  In the ED was found to have swollen left knee, with elevated WBC and x-ray shows no joint abnormality.Patient was admitted with working diagnosis of cellulitis of the left knee.  MRI was ordered and show cellulitis with underlying infrapatellar bursitis with possible infectious etiology.  Orthopedic surgery has been consulted.  Patient had an admission in December 2018 for septic prepatellar bursitis requiring I&D, she completed course of antibiotic with doxycycline but did not follow-up with Dr. Doreatha Martin for suture removal.  Assessment & Plan:   #Left knee cellulitis: -MRI of the left knee showed infra patellar bursitis which might be inflammatory or infectious.  Patient symptoms most likely attributed by lupus.  Seen by orthopedics recommended continuing supportive care and outpatient follow-up.  Patient is currently on ceftriaxone and vancomycin.  MRSA screen negative.  HIV nonreactive.  Follow-up final culture result.  Likely able to change to oral antibiotics tomorrow.  PT OT therapy.  Adjusting pain medication.  #Pain management: Patient reported multiple site pain.  Discussed about importance of switching to oral pain medication.  Started Toradol IV, Zanaflex and lower the dose of Dilaudid IV as needed.  Also on oral oxycodone as needed for the pain management.  Add senna for stool softener.  #SLE/mixed connective tissue disease: Continue prednisone and Plaquenil.  Recommend to follow-up with rheumatologist outpatient.  Patient takes oral prednisone at home.  #Hypertension: Blood pressure elevated.  Increase the dose of Norvasc  to 10 mg.  Monitor blood pressure.  Lisinopril is on hold.  #Acute kidney injury improved.  PT OT evaluation today.  DVT prophylaxis: Lovenox subcutaneous Code Status: Full code Family Communication: No family at bedside Disposition Plan: Likely discharge home in 1-2 days    Consultants:   Orthopedics  Procedures: None Antimicrobials: Vancomycin and ceftriaxone  Subjective: Seen and examined at bedside.  Reported the pain is better with the current regimen.  Discussed about the importance of tapering down narcotics.  Denies headache, dizziness, nausea vomiting chest pain.  Able to ambulate.  Objective: Vitals:   11/23/17 1745 11/23/17 2105 11/23/17 2253 11/24/17 0416  BP: (!) 159/97 (!) 190/123 (!) 176/108 (!) 178/119  Pulse:  (!) 102  (!) 105  Resp:  (!) 22  20  Temp:  97.7 F (36.5 C)  98.1 F (36.7 C)  TempSrc:  Oral  Oral  SpO2:  94%    Weight:      Height:        Intake/Output Summary (Last 24 hours) at 11/24/2017 1248 Last data filed at 11/23/2017 1854 Gross per 24 hour  Intake 100 ml  Output 650 ml  Net -550 ml   Filed Weights   11/22/17 1135  Weight: 100 kg (220 lb 7.4 oz)    Examination:  General exam: Appears calm and comfortable  Respiratory system: Clear to auscultation. Respiratory effort normal. No wheezing or crackle Cardiovascular system: S1 & S2 heard, RRR.  No pedal edema. Gastrointestinal system: Abdomen is nondistended, soft and nontender. Normal bowel sounds heard. Central nervous system: Alert and oriented. No focal neurological deficits. Extremities: Bilateral knees are swollen Skin: Multiple chronic skin rashes. Psychiatry: Judgement and insight appear normal. Mood &  affect appropriate.     Data Reviewed: I have personally reviewed following labs and imaging studies  CBC: Recent Labs  Lab 11/22/17 0233 11/23/17 0702 11/24/17 0515  WBC 21.4* 24.8* 23.6*  NEUTROABS 14.2*  --  17.8*  HGB 12.5 11.2* 10.6*  HCT 38.7 34.2*  33.3*  MCV 99.0 96.9 99.1  PLT 133* 145* 387   Basic Metabolic Panel: Recent Labs  Lab 11/22/17 0233 11/23/17 0702 11/24/17 0515  NA 132* 137  --   K 4.1 3.8  --   CL 97* 104  --   CO2 24 25  --   GLUCOSE 79 119*  --   BUN 11 9  --   CREATININE 1.20* 0.84 0.85  CALCIUM 9.0 9.1  --    GFR: Estimated Creatinine Clearance: 123.9 mL/min (by C-G formula based on SCr of 0.85 mg/dL). Liver Function Tests: Recent Labs  Lab 11/22/17 0233  AST 43*  ALT 16  ALKPHOS 73  BILITOT 2.3*  PROT 8.0  ALBUMIN 3.4*   No results for input(s): LIPASE, AMYLASE in the last 168 hours. No results for input(s): AMMONIA in the last 168 hours. Coagulation Profile: No results for input(s): INR, PROTIME in the last 168 hours. Cardiac Enzymes: No results for input(s): CKTOTAL, CKMB, CKMBINDEX, TROPONINI in the last 168 hours. BNP (last 3 results) No results for input(s): PROBNP in the last 8760 hours. HbA1C: No results for input(s): HGBA1C in the last 72 hours. CBG: No results for input(s): GLUCAP in the last 168 hours. Lipid Profile: No results for input(s): CHOL, HDL, LDLCALC, TRIG, CHOLHDL, LDLDIRECT in the last 72 hours. Thyroid Function Tests: No results for input(s): TSH, T4TOTAL, FREET4, T3FREE, THYROIDAB in the last 72 hours. Anemia Panel: No results for input(s): VITAMINB12, FOLATE, FERRITIN, TIBC, IRON, RETICCTPCT in the last 72 hours. Sepsis Labs: Recent Labs  Lab 11/22/17 0246 11/22/17 0544  LATICACIDVEN 1.78 0.89    Recent Results (from the past 240 hour(s))  Blood Culture (routine x 2)     Status: None (Preliminary result)   Collection Time: 11/22/17  2:33 AM  Result Value Ref Range Status   Specimen Description   Final    RIGHT ANTECUBITAL Performed at [facility] [address], [place], [place] [address]    Special Requests   Final    BOTTLES DRAWN AEROBIC AND ANAEROBIC Blood Culture results may not be optimal due to an inadequate volume of  blood received in culture bottles Performed at [facility] [address], [place], [place] [address]    Culture   Final    NO GROWTH 2 DAYS Performed at [facility] [address] [place], [place] [address]    Report Status PENDING  Incomplete  MRSA PCR Screening     Status: None   Collection Time: 11/23/17 10:31 AM  Result Value Ref Range Status   MRSA by PCR NEGATIVE NEGATIVE Final    Comment:        The GeneXpert MRSA Assay (FDA approved for NASAL specimens only), is one component of a comprehensive MRSA colonization surveillance program. It is not intended to diagnose MRSA infection nor to guide or monitor treatment for MRSA infections. Performed at [facility], [place] [address] [place],  [address]          Radiology Studies: Mr Knee Left W Wo Contrast  Result Date: 11/22/2017 CLINICAL DATA:  Left knee pain and swelling for 3 days EXAM: MRI OF  THE LEFT KNEE WITHOUT AND WITH CONTRAST TECHNIQUE: Multiplanar, multisequence MR imaging of the knee was performed before and after the administration of intravenous contrast. CONTRAST:  15mL MULTIHANCE GADOBENATE DIMEGLUMINE 529 MG/ML IV SOLN COMPARISON:  None. FINDINGS: MENISCI Medial meniscus:  Intact. Lateral meniscus:  Intact. LIGAMENTS Cruciates:  Intact ACL and PCL. Collaterals: Medial collateral ligament is intact. Lateral collateral ligament complex is intact. CARTILAGE Patellofemoral:  No chondral defect. Medial: Mild partial-thickness cartilage loss of the medial femorotibial compartment. Lateral:  No chondral defect. Joint: No joint effusion. Normal Hoffa's fat. No plical thickening. Small amount of fluid in the deep infrapatellar bursa. Popliteal Fossa:  No Baker's cyst. Intact popliteus tendon. Extensor Mechanism: Intact quadriceps tendon. Intact patellar tendon. Intact medial patellar retinaculum. Intact lateral patellar retinaculum. Intact MPFL. Bones: No focal marrow  signal abnormality. No fracture or dislocation. Other: Soft tissue edema enhancement involving the anterior subcutaneous fat most concerning for cellulitis. No focal fluid collection within the subcutaneous fat to suggest abscess. Multiple soft tissue calcifications within the subcutaneous fat. IMPRESSION: 1. Cellulitis along the anterior aspect of the knee. Mild deep infrapatellar bursitis which may be secondary to an inflammatory infectious etiology. Electronically Signed   By: Kathreen Devoid   On: 11/22/2017 14:44        Scheduled Meds: . amLODipine  5 mg Oral Daily  . enoxaparin (LOVENOX) injection  40 mg Subcutaneous Q24H  . hydroxychloroquine  200 mg Oral Daily  . ketorolac  15 mg Intravenous Q6H  . predniSONE  40 mg Oral Q breakfast  . tiZANidine  2 mg Oral TID   Continuous Infusions: . cefTRIAXone (ROCEPHIN)  IV Stopped (11/24/17 1117)  . vancomycin       LOS: 2 days    Dron Tanna Furry, MD [facility] Pager [phone]  If 7PM-7AM, please contact night-coverage [email or web address] Password [facility] 11/24/2017, 12:48 PM

## 2017-11-24 NOTE — Evaluation (Signed)
Physical Therapy Evaluation Patient Details Name: Linda [name] MRN: [id number] DOB: 1995-10-25 Today's Date: 11/24/2017   History of Present Illness  Bernie Konrad Saha 22 year old female with medical history of SLE and MCTD prednisone and Plaquenil, hypertension and migraine who presented to the emergency department complaining of pain on multiple including site left leg, left knee and left buttocks.  In the ED was found to have swollen left knee, with elevated WBC and x-ray shows no joint abnormality.  Creatinine also was noted to be elevated. Patient was admitted with working diagnosis of cellulitis of the left knee.  MRI was ordered and show cellulitis with underlying infrapatellar bursitis with possible infectious etiology.      Clinical Impression  Pt admitted with above diagnosis. Pt currently with functional limitations due to the deficits listed below (see PT Problem List). Pt will benefit from skilled PT to increase their independence and safety with mobility to allow discharge to the venue listed below.  Pt was surprised at how weak and dizzy she felt upon getting up.  Pt reports lying in bed for close to the last week.  Pt would benefit from walking with nursing and discussed with nurse tech.  PT will check back and make sure pt is doing well with stairs at next visit if not already d/c from hospital.     Follow Up Recommendations No PT follow up    Equipment Recommendations  None recommended by PT    Recommendations for Other Services       Precautions / Restrictions Precautions Precautions: None Restrictions Weight Bearing Restrictions: No      Mobility  Bed Mobility Overal bed mobility: Independent                Transfers Overall transfer level: Needs assistance   Transfers: Sit to/from Stand Sit to Stand: Supervision         General transfer comment: S for safety. Pt with some dizziness upon standing  Ambulation/Gait Ambulation/Gait assistance:  Min guard Ambulation Distance (Feet): 250 Feet Assistive device: (250) Gait Pattern/deviations: Step-through pattern Gait velocity: decreased   General Gait Details: Pt reliant on rail due to feeling of weakness and slight dizziness.  Stairs            Wheelchair Mobility    Modified Rankin (Stroke Patients Only)       Balance Overall balance assessment: Needs assistance         Standing balance support: During functional activity Standing balance-Leahy Scale: Fair                               Pertinent Vitals/Pain Pain Assessment: 0-10 Pain Score: 8  Pain Location: L knee Pain Intervention(s): Monitored during session    Home Living Family/patient expects to be discharged to:: Private residence Living Arrangements: Parent Available Help at Discharge: Available PRN/intermittently Type of Home: Mobile home Home Access: Stairs to enter Entrance Stairs-Rails: Can reach both Entrance Stairs-Number of Steps: 4-5 Home Layout: One level Home Equipment: None      Prior Function Level of Independence: Independent         Comments: ADL, IADLs, driving     Hand Dominance   Dominant Hand: Right    Extremity/Trunk Assessment   Upper Extremity Assessment Upper Extremity Assessment: Overall WFL for tasks assessed    Lower Extremity Assessment Lower Extremity Assessment: Overall WFL for tasks assessed       Communication  Communication: No difficulties  Cognition Arousal/Alertness: Awake/alert Behavior During Therapy: WFL for tasks assessed/performed Overall Cognitive Status: Within Functional Limits for tasks assessed                                        General Comments      Exercises     Assessment/Plan    PT Assessment Patient needs continued PT services  PT Problem List Decreased strength;Decreased activity tolerance;Decreased balance;Decreased mobility       PT Treatment Interventions Gait  training;Stair training;Functional mobility training;Therapeutic exercise;Therapeutic activities;Patient/family education    PT Goals (Current goals can be found in the Care Plan section)  Acute Rehab PT Goals Patient Stated Goal: home PT Goal Formulation: With patient Time For Goal Achievement: 12/08/17 Potential to Achieve Goals: Good    Frequency Min 2X/week   Barriers to discharge        Co-evaluation               AM-PAC PT "6 Clicks" Daily Activity  Outcome Measure Difficulty turning over in bed (including adjusting bedclothes, sheets and blankets)?: None Difficulty moving from lying on back to sitting on the side of the bed? : None Difficulty sitting down on and standing up from a chair with arms (e.g., wheelchair, bedside commode, etc,.)?: None Help needed moving to and from a bed to chair (including a wheelchair)?: None Help needed walking in hospital room?: A Little Help needed climbing 3-5 steps with a railing? : A Little 6 Click Score: 22    End of Session   Activity Tolerance: Patient limited by fatigue Patient left: in bed Nurse Communication: Mobility status PT Visit Diagnosis: Unsteadiness on feet (R26.81)    Time: 1120-1140 PT Time Calculation (min) (ACUTE ONLY): 20 min   Charges:   PT Evaluation $PT Eval Low Complexity: 1 Low     PT G Codes:        Karen L. Tamala Julian, [place] Pager [phone] 11/24/2017   Galen Manila 11/24/2017, 11:48 AM

## 2017-11-24 NOTE — Progress Notes (Signed)
Pharmacy Antibiotic Note  Linda [name] is a 22 y.o. female admitted on 11/22/2017 with cellulitis.  Pharmacy has been consulted for vancomycin dosing. Zosyn initially ordered but changed to ceftriaxone at admission.  Patient with h/o lupus and septic R knee in Dec 2018 - cx grew MSSA and she was discharged on doxycycline.  Patient presents 3/25 with L knee/leg pain  Today, 11/24/2017  D#3 antibiotics  WBC elevated  AKI - improved to baseline  Afebrile  Plan:  Increase Vancomycin 1500mg  IV q12h for improved renal function  Ceftriaxone 2gm IV q24h  Monitor SCr  Anticipate d/c home on oral abx tomorrow therefore defer vancomcyin levels unless plan changes  Temp (24hrs), Avg:97.9 F (36.6 C), Min:97.7 F (36.5 C), Max:98.1 F (36.7 C)  Recent Labs  Lab 11/22/17 0233 11/22/17 0246 11/22/17 0544 11/23/17 0702 11/24/17 0515  WBC 21.4*  --   --  24.8* 23.6*  CREATININE 1.20*  --   --  0.84 0.85  LATICACIDVEN  --  1.78 0.89  --   --     Estimated Creatinine Clearance: 123.9 mL/min (by C-G formula based on SCr of 0.85 mg/dL).    Allergies  Allergen Reactions  . Vicodin [Hydrocodone-Acetaminophen] Itching and Rash    Antimicrobials this admission: 3/25 zosyn x1 3/25 vanco >> 3/25 ceftriaxone >>  Dose adjustments this admission: 3/27 [place] to 1500mg  IV Q12h  Microbiology results: Microbiology results: 3/25 BCx: ngtd 3/26 MRSA PCR :neg 3/26 HIV: neg  Thank you for allowing pharmacy to be a part of this patient's care.  Netta Cedars, PharmD, BCPS Pager: [phone] 11/24/2017 9:32 AM

## 2017-11-25 MED ORDER — TIZANIDINE HCL 2 MG PO TABS: 2.0000 mg | ORAL_TABLET | Freq: Three times a day (TID) | ORAL | 0 refills | Status: DC | PRN

## 2017-11-25 MED ORDER — DOXYCYCLINE HYCLATE 100 MG PO TABS
100.0000 mg | ORAL_TABLET | Freq: Two times a day (BID) | ORAL | Status: DC
  Administered 2017-11-25: 100 mg via ORAL
  Filled 2017-11-25: qty 1

## 2017-11-25 MED ORDER — CEPHALEXIN 500 MG PO CAPS: 500.0000 mg | ORAL_CAPSULE | Freq: Two times a day (BID) | ORAL | 0 refills | Status: AC

## 2017-11-25 MED ORDER — IBUPROFEN 200 MG PO TABS: 400.0000 mg | ORAL_TABLET | Freq: Three times a day (TID) | ORAL | 0 refills | Status: DC | PRN

## 2017-11-25 MED ORDER — DOXYCYCLINE HYCLATE 100 MG PO TABS: 100.0000 mg | ORAL_TABLET | Freq: Two times a day (BID) | ORAL | 0 refills | Status: AC

## 2017-11-25 MED ORDER — CEPHALEXIN 500 MG PO CAPS
500.0000 mg | ORAL_CAPSULE | Freq: Four times a day (QID) | ORAL | Status: DC
  Administered 2017-11-25: 500 mg via ORAL
  Filled 2017-11-25: qty 1

## 2017-11-25 MED ORDER — PREDNISONE 10 MG PO TABS: 10.0000 mg | ORAL_TABLET | Freq: Every day | ORAL | 0 refills | Status: DC

## 2017-11-25 MED ORDER — AMLODIPINE BESYLATE 10 MG PO TABS: 10.0000 mg | ORAL_TABLET | Freq: Every day | ORAL | 0 refills | Status: DC

## 2017-11-25 NOTE — Discharge Summary (Signed)
Physician Discharge Summary  Linda [name] GYB:[id number] DOB: 1995/09/16 DOA: 11/22/2017  PCP: Tonette Bihari, MD  Admit date: 11/22/2017 Discharge date: 11/25/2017  Admitted From:home Disposition:home  Recommendations for Outpatient Follow-up:  1. Follow up with PCP in 1-2 weeks 2. Please obtain BMP/CBC in one week   Home Health:no Equipment/Devices:none Discharge Condition:stable CODE STATUS:full code Diet recommendation:heart healthy  Brief/Interim Summary:  22 year-old female with medical history of SLE and MCTD prednisone and Plaquenil, hypertension and migraine who presented to the emergency department complaining of pain on multiple including site left leg, left knee and left buttocks. In the ED was found to have swollen left knee, with elevated WBC and x-ray shows no joint abnormality.Patient was admitted with working diagnosis of cellulitis of the left knee. MRI was ordered and show cellulitis with underlying infrapatellar bursitis with possible infectious etiology. Orthopedic surgery has been consulted.  Patient had an admission in December 2018 for septic prepatellar bursitis requiring I&D, she completed course of antibiotic with doxycycline but did not follow-up with Dr. Doreatha Martin for suture removal.  #Left knee cellulitis: -MRI of the left knee showed infra patellar bursitis which might be inflammatory or infectious.  Patient symptoms most likely attributed by lupus.  Seen by orthopedics recommended continuing supportive care and outpatient follow-up.  Patient was treated with ceftriaxone and vancomycin.  MRSA screen negative.  HIV nonreactive.  Change antibiotics to Keflex and doxycycline for 3 more days.  Patient's knee swelling and pain has significantly improved.  Discharge with tapering dose of prednisone.  Patient takes 5 mg daily at home.  Recommended to follow-up with orthopedics and rheumatologist.  She verbalized understanding.  Evaluated by PT OT.   She can  ambulate without difficulty.  #Pain management: Pain is improved.  Recommended ibuprofen for anti-inflammatory and is started on Zanaflex seems like it is helping.  Recommended to follow-up with PCP.  #SLE/mixed connective tissue disease: Continue prednisone and Plaquenil.  Recommend to follow-up with rheumatologist outpatient.  Patient takes oral prednisone at home.  #Hypertension: On lisinopril, hydrochlorothiazide and Norvasc.  I recommend to monitor blood pressure.   #Acute kidney injury improved.  Patient is clinically stable and improved.  Next close outpatient follow-up.  Stable on discharge.  Discharge Diagnoses:  Principal Problem:   Cellulitis Active Problems:   Mixed connective tissue disease ([place])   Primary hypertension   Lupus erythematosus    Discharge Instructions  Discharge Instructions    Call MD for:  difficulty breathing, headache or visual disturbances   Complete by:  As directed    Call MD for:  extreme fatigue   Complete by:  As directed    Call MD for:  hives   Complete by:  As directed    Call MD for:  persistant dizziness or light-headedness   Complete by:  As directed    Call MD for:  persistant nausea and vomiting   Complete by:  As directed    Call MD for:  severe uncontrolled pain   Complete by:  As directed    Call MD for:  temperature >100.4   Complete by:  As directed    Diet - low sodium heart healthy   Complete by:  As directed    Discharge instructions   Complete by:  As directed    Please continue to follow-up with her rheumatologist, PCP and orthopedics.   Increase activity slowly   Complete by:  As directed      Allergies as of 11/25/2017  Reactions   Vicodin [hydrocodone-acetaminophen] Itching, Rash      Medication List    STOP taking these medications   famotidine 20 MG tablet Commonly known as:  PEPCID   lidocaine 4 % cream Commonly known as:  LMX   traMADol 50 MG tablet Commonly known as:  ULTRAM     TAKE  these medications   amLODipine 10 MG tablet Commonly known as:  NORVASC Take 1 tablet (10 mg total) by mouth daily. What changed:  Another medication with the same name was removed. Continue taking this medication, and follow the directions you see here.   cephALEXin 500 MG capsule Commonly known as:  KEFLEX Take 1 capsule (500 mg total) by mouth 2 (two) times daily for 3 days.   doxycycline 100 MG tablet Commonly known as:  VIBRA-TABS Take 1 tablet (100 mg total) by mouth every 12 (twelve) hours for 3 days.   hydrochlorothiazide 25 MG tablet Commonly known as:  HYDRODIURIL Take 1 tablet (25 mg total) by mouth daily.   hydroxychloroquine 200 MG tablet Commonly known as:  PLAQUENIL Take 200 mg by mouth daily.   ibuprofen 200 MG tablet Commonly known as:  MOTRIN IB Take 2 tablets (400 mg total) by mouth every 8 (eight) hours as needed.   lisinopril 20 MG tablet Commonly known as:  PRINIVIL,ZESTRIL Take 1 tablet (20 mg total) by mouth daily.   oxyCODONE 5 MG immediate release tablet Commonly known as:  Oxy IR/ROXICODONE Take 1 tablet (5 mg total) by mouth every 6 (six) hours as needed for breakthrough pain.   predniSONE 5 MG tablet Commonly known as:  DELTASONE Take 5 mg by mouth daily with breakfast. What changed:  Another medication with the same name was added. Make sure you understand how and when to take each.   predniSONE 10 MG tablet Commonly known as:  DELTASONE Take 1 tablet (10 mg total) by mouth daily. Take 3 tabs for 2 days, 2 tablets for 2 days, 1 tablet for 2 days and then continue 5 mg daily as you were taking before. What changed:  You were already taking a medication with the same name, and this prescription was added. Make sure you understand how and when to take each.   tiZANidine 2 MG tablet Commonly known as:  ZANAFLEX Take 1 tablet (2 mg total) by mouth every 8 (eight) hours as needed for muscle spasms.      Follow-up Information    Tonette Bihari, MD. Schedule an appointment as soon as possible for a visit in 1 week(s).   Specialty:  Family Medicine Contact information: [place] [address] [phone]        Shona Needles, MD Follow up.   Specialty:  Orthopedic Surgery Contact information: [address] [place] [address] [phone]          Allergies  Allergen Reactions  . Vicodin [Hydrocodone-Acetaminophen] Itching and Rash    Consultations: Orthopedics  Procedures/Studies: None  Subjective: Seen and examined at bedside.  The pain is improved.  Knee swelling has subsided.  Able to ambulate.  Denies headache, dizziness, nausea vomiting.  Discharge Exam: Vitals:   11/24/17 2032 11/25/17 0518  BP: 125/86 (!) 163/111  Pulse: 82 94  Resp: 18 20  Temp: 97.7 F (36.5 C) 97.8 F (36.6 C)  SpO2: 100% 96%   Vitals:   11/24/17 1517 11/24/17 1536 11/24/17 2032 11/25/17 0518  BP: (!) 157/117 (!) 146/109 125/86 Marland Kitchen)  163/111  Pulse:   82 94  Resp:   18 20  Temp:   97.7 F (36.5 C) 97.8 F (36.6 C)  TempSrc:   Oral Oral  SpO2:   100% 96%  Weight:      Height:        General: Pt is alert, awake, not in acute distress Cardiovascular: RRR, S1/S2 +, no rubs, no gallops Respiratory: CTA bilaterally, no wheezing, no rhonchi Abdominal: Soft, NT, ND, bowel sounds + Extremities: no edema, no cyanosis Bilateral knee swelling has significantly improved.   The results of significant diagnostics from this hospitalization (including imaging, microbiology, ancillary and laboratory) are listed below for reference.     Microbiology: Recent Results (from the past 240 hour(s))  Blood Culture (routine x 2)     Status: None (Preliminary result)   Collection Time: 11/22/17  2:33 AM  Result Value Ref Range Status   Specimen Description   Final    RIGHT ANTECUBITAL Performed at [facility] [address], [place], [place] [address]    Special Requests    Final    BOTTLES DRAWN AEROBIC AND ANAEROBIC Blood Culture results may not be optimal due to an inadequate volume of blood received in culture bottles Performed at [facility] [address] [place], [place] [address]    Culture   Final    NO GROWTH 2 DAYS Performed at [facility] [address], [place], [place] [address]    Report Status PENDING  Incomplete  MRSA PCR Screening     Status: None   Collection Time: 11/23/17 10:31 AM  Result Value Ref Range Status   MRSA by PCR NEGATIVE NEGATIVE Final    Comment:        The GeneXpert MRSA Assay (FDA approved for NASAL specimens only), is one component of a comprehensive MRSA colonization surveillance program. It is not intended to diagnose MRSA infection nor to guide or monitor treatment for MRSA infections. Performed at [facility], [place] [address] [place], [place] [address]      Labs: BNP (last 3 results) No results for input(s): BNP in the last 8760 hours. Basic Metabolic Panel: Recent Labs  Lab 11/22/17 0233 11/23/17 0702 11/24/17 0515  NA 132* 137  --   K 4.1 3.8  --   CL 97* 104  --   CO2 24 25  --   GLUCOSE 79 119*  --   BUN 11 9  --   CREATININE 1.20* 0.84 0.85  CALCIUM 9.0 9.1  --    Liver Function Tests: Recent Labs  Lab 11/22/17 0233  AST 43*  ALT 16  ALKPHOS 73  BILITOT 2.3*  PROT 8.0  ALBUMIN 3.4*   No results for input(s): LIPASE, AMYLASE in the last 168 hours. No results for input(s): AMMONIA in the last 168 hours. CBC: Recent Labs  Lab 11/22/17 0233 11/23/17 0702 11/24/17 0515  WBC 21.4* 24.8* 23.6*  NEUTROABS 14.2*  --  17.8*  HGB 12.5 11.2* 10.6*  HCT 38.7 34.2* 33.3*  MCV 99.0 96.9 99.1  PLT 133* 145* 182   Cardiac Enzymes: No results for input(s): CKTOTAL, CKMB, CKMBINDEX, TROPONINI in the last 168 hours. BNP: Invalid input(s): POCBNP CBG: No results for input(s): GLUCAP in the last 168 hours. D-Dimer No results for  input(s): DDIMER in the last 72 hours. Hgb A1c No results for input(s): HGBA1C in the last 72 hours. Lipid Profile No results for input(s): CHOL,  HDL, LDLCALC, TRIG, CHOLHDL, LDLDIRECT in the last 72 hours. Thyroid function studies No results for input(s): TSH, T4TOTAL, T3FREE, THYROIDAB in the last 72 hours.  Invalid input(s): FREET3 Anemia work up No results for input(s): VITAMINB12, FOLATE, FERRITIN, TIBC, IRON, RETICCTPCT in the last 72 hours. Urinalysis    Component Value Date/Time   COLORURINE YELLOW 11/22/2017 0233   APPEARANCEUR CLEAR 11/22/2017 0233   LABSPEC 1.011 11/22/2017 0233   PHURINE 6.0 11/22/2017 0233   GLUCOSEU NEGATIVE 11/22/2017 0233   HGBUR SMALL (A) 11/22/2017 0233   BILIRUBINUR NEGATIVE 11/22/2017 0233   KETONESUR NEGATIVE 11/22/2017 0233   PROTEINUR 30 (A) 11/22/2017 0233   UROBILINOGEN 1.0 03/30/2015 2328   NITRITE NEGATIVE 11/22/2017 0233   LEUKOCYTESUR TRACE (A) 11/22/2017 0233   Sepsis Labs Invalid input(s): PROCALCITONIN,  WBC,  LACTICIDVEN Microbiology Recent Results (from the past 240 hour(s))  Blood Culture (routine x 2)     Status: None (Preliminary result)   Collection Time: 11/22/17  2:33 AM  Result Value Ref Range Status   Specimen Description   Final    RIGHT ANTECUBITAL Performed at [facility], [place] [address] [place], [place] [address]    Special Requests   Final    BOTTLES DRAWN AEROBIC AND ANAEROBIC Blood Culture results may not be optimal due to an inadequate volume of blood received in culture bottles Performed at [facility] [address] [place], [place] [address]    Culture   Final    NO GROWTH 2 DAYS Performed at [facility] [address], [place], [place] [address]    Report Status PENDING  Incomplete  MRSA PCR Screening     Status: None   Collection Time: 11/23/17 10:31 AM  Result Value Ref Range Status   MRSA by PCR NEGATIVE NEGATIVE Final    Comment:        The  GeneXpert MRSA Assay (FDA approved for NASAL specimens only), is one component of a comprehensive MRSA colonization surveillance program. It is not intended to diagnose MRSA infection nor to guide or monitor treatment for MRSA infections. Performed at [facility], [place] [address] [place], [place] [address]      Time coordinating discharge:  30 minutes  SIGNED:   Rosita Fire, MD  [facility] 11/25/2017, 11:10 AM  If 7PM-7AM, please contact night-coverage [email or web address] Password [email or web address]

## 2017-11-27 LAB — CULTURE, BLOOD (ROUTINE X 2): Culture: NO GROWTH

## 2017-11-29 ENCOUNTER — Emergency Department (HOSPITAL_COMMUNITY): Payer: Medicaid Other

## 2017-11-29 ENCOUNTER — Encounter (HOSPITAL_COMMUNITY): Payer: Self-pay | Admitting: Emergency Medicine

## 2017-11-29 ENCOUNTER — Other Ambulatory Visit: Payer: Self-pay

## 2017-11-29 ENCOUNTER — Inpatient Hospital Stay (HOSPITAL_COMMUNITY)
Admission: EM | Admit: 2017-11-29 | Discharge: 2017-12-04 | DRG: 305 | Disposition: A | Discharge status: Home / Self Care | Payer: Medicaid Other | Attending: Internal Medicine | Admitting: Internal Medicine

## 2017-11-29 DIAGNOSIS — Z7952 Long term (current) use of systemic steroids: Secondary | ICD-10-CM

## 2017-11-29 DIAGNOSIS — Z79899 Other long term (current) drug therapy: Secondary | ICD-10-CM | POA: Diagnosis not present

## 2017-11-29 DIAGNOSIS — M329 Systemic lupus erythematosus, unspecified: Secondary | ICD-10-CM | POA: Diagnosis present

## 2017-11-29 DIAGNOSIS — R109 Unspecified abdominal pain: Secondary | ICD-10-CM | POA: Diagnosis present

## 2017-11-29 DIAGNOSIS — Z9114 Patient's other noncompliance with medication regimen: Secondary | ICD-10-CM | POA: Diagnosis not present

## 2017-11-29 DIAGNOSIS — Z885 Allergy status to narcotic agent status: Secondary | ICD-10-CM

## 2017-11-29 DIAGNOSIS — I10 Essential (primary) hypertension: Secondary | ICD-10-CM | POA: Diagnosis present

## 2017-11-29 DIAGNOSIS — L03116 Cellulitis of left lower limb: Secondary | ICD-10-CM | POA: Diagnosis present

## 2017-11-29 DIAGNOSIS — I160 Hypertensive urgency: Secondary | ICD-10-CM

## 2017-11-29 DIAGNOSIS — G894 Chronic pain syndrome: Secondary | ICD-10-CM | POA: Diagnosis present

## 2017-11-29 DIAGNOSIS — I313 Pericardial effusion (noninflammatory): Secondary | ICD-10-CM | POA: Diagnosis present

## 2017-11-29 DIAGNOSIS — F17210 Nicotine dependence, cigarettes, uncomplicated: Secondary | ICD-10-CM | POA: Diagnosis present

## 2017-11-29 DIAGNOSIS — I161 Hypertensive emergency: Principal | ICD-10-CM | POA: Diagnosis present

## 2017-11-29 DIAGNOSIS — M351 Other overlap syndromes: Secondary | ICD-10-CM | POA: Diagnosis present

## 2017-11-29 DIAGNOSIS — L930 Discoid lupus erythematosus: Secondary | ICD-10-CM | POA: Diagnosis present

## 2017-11-29 DIAGNOSIS — D72829 Elevated white blood cell count, unspecified: Secondary | ICD-10-CM

## 2017-11-29 DIAGNOSIS — I509 Heart failure, unspecified: Secondary | ICD-10-CM

## 2017-11-29 DIAGNOSIS — R112 Nausea with vomiting, unspecified: Secondary | ICD-10-CM

## 2017-11-29 DIAGNOSIS — I3139 Other pericardial effusion (noninflammatory): Secondary | ICD-10-CM

## 2017-11-29 DIAGNOSIS — K529 Noninfective gastroenteritis and colitis, unspecified: Secondary | ICD-10-CM

## 2017-11-29 DIAGNOSIS — I340 Nonrheumatic mitral (valve) insufficiency: Secondary | ICD-10-CM | POA: Diagnosis not present

## 2017-11-29 DIAGNOSIS — R197 Diarrhea, unspecified: Secondary | ICD-10-CM

## 2017-11-29 HISTORY — DX: Hypertensive urgency: I16.0

## 2017-11-29 LAB — CBC
HCT: 41.9 % (ref 36.0–46.0)
HCT: 41.1 % (ref 36.0–46.0)
Hemoglobin: 14.1 g/dL (ref 12.0–15.0)
Hemoglobin: 13.2 g/dL (ref 12.0–15.0)
MCH: 32.7 pg (ref 26.0–34.0)
MCH: 31.7 pg (ref 26.0–34.0)
MCHC: 33.7 g/dL (ref 30.0–36.0)
MCHC: 32.1 g/dL (ref 30.0–36.0)
MCV: 97.2 fL (ref 78.0–100.0)
MCV: 98.8 fL (ref 78.0–100.0)
Platelets: 233 10*3/uL (ref 150–400)
Platelets: 257 10*3/uL (ref 150–400)
RBC: 4.31 MIL/uL (ref 3.87–5.11)
RBC: 4.16 MIL/uL (ref 3.87–5.11)
RDW: 15.5 % (ref 11.5–15.5)
RDW: 15.8 % — ABNORMAL HIGH (ref 11.5–15.5)
WBC: 17.9 10*3/uL — ABNORMAL HIGH (ref 4.0–10.5)
WBC: 17.3 10*3/uL — ABNORMAL HIGH (ref 4.0–10.5)

## 2017-11-29 LAB — URINALYSIS, ROUTINE W REFLEX MICROSCOPIC
Bilirubin Urine: NEGATIVE
Glucose, UA: NEGATIVE mg/dL
Hgb urine dipstick: NEGATIVE
Ketones, ur: NEGATIVE mg/dL
Leukocytes, UA: NEGATIVE
Nitrite: NEGATIVE
Protein, ur: NEGATIVE mg/dL
Specific Gravity, Urine: 1.008 (ref 1.005–1.030)
pH: 7 (ref 5.0–8.0)

## 2017-11-29 LAB — COMPREHENSIVE METABOLIC PANEL
ALT: 20 U/L (ref 14–54)
AST: 35 U/L (ref 15–41)
Albumin: 4 g/dL (ref 3.5–5.0)
Alkaline Phosphatase: 86 U/L (ref 38–126)
Anion gap: 18 — ABNORMAL HIGH (ref 5–15)
BUN: 13 mg/dL (ref 6–20)
CO2: 21 mmol/L — ABNORMAL LOW (ref 22–32)
Calcium: 9.6 mg/dL (ref 8.9–10.3)
Chloride: 99 mmol/L — ABNORMAL LOW (ref 101–111)
Creatinine, Ser: 1.04 mg/dL — ABNORMAL HIGH (ref 0.44–1.00)
GFR calc Af Amer: >60 mL/min (ref >60)
GFR calc non Af Amer: >60 mL/min (ref >60)
Glucose, Bld: 95 mg/dL (ref 65–99)
Potassium: 3.7 mmol/L (ref 3.5–5.1)
Sodium: 138 mmol/L (ref 135–145)
Total Bilirubin: 1.6 mg/dL — ABNORMAL HIGH (ref 0.3–1.2)
Total Protein: 8.9 g/dL — ABNORMAL HIGH (ref 6.5–8.1)

## 2017-11-29 LAB — I-STAT BETA HCG BLOOD, ED (MC, WL, AP ONLY): I-stat hCG, quantitative: <5 m[IU]/mL (ref <5)

## 2017-11-29 LAB — CREATININE, SERUM
Creatinine, Ser: 0.87 mg/dL (ref 0.44–1.00)
GFR calc Af Amer: >60 mL/min (ref >60)
GFR calc non Af Amer: >60 mL/min (ref >60)

## 2017-11-29 LAB — LIPASE, BLOOD: Lipase: 24 U/L (ref 11–51)

## 2017-11-29 LAB — I-STAT TROPONIN, ED: Troponin i, poc: 0 ng/mL (ref 0.00–0.08)

## 2017-11-29 LAB — BRAIN NATRIURETIC PEPTIDE: B Natriuretic Peptide: 1781.4 pg/mL — ABNORMAL HIGH (ref 0.0–100.0)

## 2017-11-29 MED ORDER — ONDANSETRON HCL 4 MG/2ML IJ SOLN
4.0000 mg | Freq: Once | INTRAMUSCULAR | Status: DC
  Filled 2017-11-29: qty 2

## 2017-11-29 MED ORDER — AMLODIPINE BESYLATE 10 MG PO TABS
10.0000 mg | ORAL_TABLET | Freq: Every day | ORAL | Status: DC
  Administered 2017-11-30 – 2017-12-04 (×5): 10 mg via ORAL
  Filled 2017-11-29: qty 2
  Filled 2017-11-29: qty 1
  Filled 2017-11-29 (×2): qty 2
  Filled 2017-11-29: qty 1

## 2017-11-29 MED ORDER — NICARDIPINE HCL IN NACL 20-0.86 MG/200ML-% IV SOLN
3.0000 mg/h | INTRAVENOUS | Status: AC
  Administered 2017-11-30: 5 mg/h via INTRAVENOUS
  Administered 2017-11-30 (×2): 10 mg/h via INTRAVENOUS
  Administered 2017-11-30 (×3): 7.5 mg/h via INTRAVENOUS
  Administered 2017-11-30: 5 mg/h via INTRAVENOUS
  Filled 2017-11-29 (×8): qty 200

## 2017-11-29 MED ORDER — METRONIDAZOLE IN NACL 5-0.79 MG/ML-% IV SOLN
500.0000 mg | Freq: Three times a day (TID) | INTRAVENOUS | Status: DC
  Administered 2017-11-29 – 2017-12-03 (×11): 500 mg via INTRAVENOUS
  Filled 2017-11-29 (×11): qty 100

## 2017-11-29 MED ORDER — LEVOFLOXACIN IN D5W 500 MG/100ML IV SOLN
500.0000 mg | INTRAVENOUS | Status: DC
Start: 2017-11-29 — End: 2017-12-03
  Administered 2017-11-29 – 2017-12-02 (×4): 500 mg via INTRAVENOUS
  Filled 2017-11-29 (×5): qty 100

## 2017-11-29 MED ORDER — NICARDIPINE HCL IN NACL 20-0.86 MG/200ML-% IV SOLN
5.0000 mg/h | Freq: Once | INTRAVENOUS | Status: AC
  Administered 2017-11-29: 5 mg/h via INTRAVENOUS
  Filled 2017-11-29: qty 200

## 2017-11-29 MED ORDER — HYDROMORPHONE HCL 1 MG/ML IJ SOLN
1.0000 mg | INTRAMUSCULAR | Status: DC | PRN
  Administered 2017-11-29 – 2017-12-04 (×33): 1 mg via INTRAVENOUS
  Filled 2017-11-29 (×33): qty 1

## 2017-11-29 MED ORDER — PREDNISONE 5 MG PO TABS
5.0000 mg | ORAL_TABLET | Freq: Every day | ORAL | Status: DC
  Administered 2017-11-30: 5 mg via ORAL
  Filled 2017-11-29: qty 1

## 2017-11-29 MED ORDER — HYDROCHLOROTHIAZIDE 25 MG PO TABS
25.0000 mg | ORAL_TABLET | Freq: Every day | ORAL | Status: DC
  Administered 2017-11-30 – 2017-12-01 (×2): 25 mg via ORAL
  Filled 2017-11-29 (×2): qty 1

## 2017-11-29 MED ORDER — HYDROMORPHONE HCL 1 MG/ML IJ SOLN
1.0000 mg | Freq: Once | INTRAMUSCULAR | Status: AC
  Administered 2017-11-29: 1 mg via INTRAVENOUS
  Filled 2017-11-29: qty 1

## 2017-11-29 MED ORDER — ONDANSETRON HCL 4 MG/2ML IJ SOLN
4.0000 mg | Freq: Four times a day (QID) | INTRAMUSCULAR | Status: DC | PRN
  Administered 2017-11-30 – 2017-12-03 (×5): 4 mg via INTRAVENOUS
  Filled 2017-11-29 (×5): qty 2

## 2017-11-29 MED ORDER — SODIUM CHLORIDE 0.9 % IV BOLUS
1000.0000 mL | Freq: Once | INTRAVENOUS | Status: AC
  Administered 2017-11-29: 1000 mL via INTRAVENOUS

## 2017-11-29 MED ORDER — SODIUM CHLORIDE 0.9 % IV SOLN
INTRAVENOUS | Status: DC
  Administered 2017-11-30 – 2017-12-02 (×3): via INTRAVENOUS

## 2017-11-29 MED ORDER — NICARDIPINE HCL IN NACL 20-0.86 MG/200ML-% IV SOLN
5.0000 mg/h | INTRAVENOUS | Status: DC
Start: 2017-11-29 — End: 2017-11-29
  Filled 2017-11-29: qty 200

## 2017-11-29 MED ORDER — ONDANSETRON HCL 4 MG/2ML IJ SOLN
4.0000 mg | Freq: Once | INTRAMUSCULAR | Status: AC | PRN
  Administered 2017-11-29: 4 mg via INTRAVENOUS
  Filled 2017-11-29: qty 2

## 2017-11-29 MED ORDER — HEPARIN SODIUM (PORCINE) 5000 UNIT/ML IJ SOLN
5000.0000 [IU] | Freq: Three times a day (TID) | INTRAMUSCULAR | Status: DC
  Administered 2017-11-29 – 2017-12-04 (×9): 5000 [IU] via SUBCUTANEOUS
  Filled 2017-11-29 (×11): qty 1

## 2017-11-29 MED ORDER — IOPAMIDOL (ISOVUE-300) INJECTION 61%
INTRAVENOUS | Status: AC
  Administered 2017-11-29: 100 mL via INTRAVENOUS
  Filled 2017-11-29: qty 100

## 2017-11-29 NOTE — ED Notes (Signed)
Patient ambulated to restroom but would not put socks on. Handed patient a urine specimen cup and patient threw cup into sink.

## 2017-11-29 NOTE — ED Notes (Signed)
ED Provider at bedside. 

## 2017-11-29 NOTE — Progress Notes (Signed)
Pt gave permission for questions on nursing admission history to be asked in front of her visitor. Lucius Conn BSN, RN-BC Admissions RN 11/29/2017 5:49 PM

## 2017-11-29 NOTE — ED Triage Notes (Signed)
Per EMS pt ongoing n/v and lupus since discharge. Unable to take blood pressure medications related to such.

## 2017-11-29 NOTE — H&P (Addendum)
History and Physical    Linda [name] TWK:[id number] DOB: 03-Apr-1996 DOA: 11/29/2017  PCP: Tonette Bihari, MD   Patient coming from: Home    Chief Complaint: Abdominal pain, nausea, vomiting, diarrhea, high blood pressure  HPI: Linda [name] is a 22 y.o. female with medical history significant of SLE, mixed connective tissue disorder, hypertension, migraine who presented to the emergency department today with complaints of nausea, vomiting, abdominal pain, diarrhea.  Patient was just discharged on 11/25/17 after treatment of cellulitis and infrapatellar bursitis of the left knee.  Patient was discharged on oral antibiotics   which she finished at home. Patient started having nausea, vomiting, abdomen pain and diarrhea since last 4 days.  It has been progressively getting worse.  Patient denies any fever but was having chills.  Patient denies any chest pain, shortness of breath, palpitations, dysuria or headache. When patient was seen by EMS, her systolic blood pressure was in the range of 200s.  Patient was then brought to the emergency department. Patient seen and examined the bedside in the emergency department.  She was complaining of severe abdominal pain.  She reported having multiple bowel movements today which were loose and watery.  Patient noted to have severe tenderness of the abdomen.   Patient has history of SLE and mixed connective  tissue disorder and follows with rheumatology as an outpatient.  She is on prednisone 5 mg daily at home.   ED Course: Patient's blood pressure was found in the range of 216/152 mmHg in the emergency department. She was  started on nicardipine drip.CT abdomen showed:Multiple mildly dilated loops of small bowel with thickened edematous walls consistent with underlying small bowel inflammation.No obstructive changes are seen.  Review of Systems: As per HPI otherwise 10 point review of systems negative.    Past Medical History:  Diagnosis  Date  . Lupus ([place])   . Migraines   . Mixed connective tissue disease [facility])     Past Surgical History:  Procedure Laterality Date  . I&D EXTREMITY Right 08/06/2017   Procedure: IRRIGATION AND DEBRIDEMENT KNEE;  Surgeon: Shona Needles, MD;  Location: [place];  Service: Orthopedics;  Laterality: Right;  . MULTIPLE TOOTH EXTRACTIONS       reports that she has been smoking cigarettes and cigars.  She started smoking about 2 years ago. She has been smoking about 0.00 packs per day. She has never used smokeless tobacco. She reports that she does not drink alcohol or use drugs.  Allergies  Allergen Reactions  . Vicodin [Hydrocodone-Acetaminophen] Itching and Rash    Family History  Problem Relation Age of Onset  . Lupus Mother   . Hypertension Mother   . Kidney disease Mother   . Diabetes Maternal Grandmother      Prior to Admission medications   Medication Sig Start Date End Date Taking? Authorizing Provider  amLODipine (NORVASC) 10 MG tablet Take 1 tablet (10 mg total) by mouth daily. 11/25/17  Yes Rosita Fire, MD  hydrochlorothiazide (HYDRODIURIL) 25 MG tablet Take 1 tablet (25 mg total) by mouth daily. 09/14/17  Yes Pollina, Gwenyth Allegra, MD  oxyCODONE (OXY IR/ROXICODONE) 5 MG immediate release tablet Take 1 tablet (5 mg total) by mouth every 6 (six) hours as needed for breakthrough pain. 08/09/17  Yes Riccio, Angela C, DO  predniSONE (DELTASONE) 10 MG tablet Take 1 tablet (10 mg total) by mouth daily. Take 3 tabs for 2 days, 2 tablets for 2 days, 1 tablet for 2 days  and then continue 5 mg daily as you were taking before. 11/25/17  Yes Rosita Fire, MD  tiZANidine (ZANAFLEX) 2 MG tablet Take 1 tablet (2 mg total) by mouth every 8 (eight) hours as needed for muscle spasms. 11/25/17  Yes Rosita Fire, MD  ibuprofen (MOTRIN IB) 200 MG tablet Take 2 tablets (400 mg total) by mouth every 8 (eight) hours as needed. Patient not taking: Reported on 11/29/2017 11/25/17    Rosita Fire, MD  lisinopril (PRINIVIL,ZESTRIL) 20 MG tablet Take 1 tablet (20 mg total) by mouth daily. Patient not taking: Reported on 11/22/2017 09/14/17   Orpah Greek, MD    Physical Exam: Vitals:   11/29/17 1615 11/29/17 1630 11/29/17 1645 11/29/17 1731  BP: (!) 194/134 (!) 186/130 (!) 191/131 (!) 186/125  Pulse: (!) 103 (!) 106 (!) 110 (!) 107  Resp: 18 20 20  (!) 22  Temp:      TempSrc:      SpO2: 98% 100% 100% 100%    Constitutional: NAD, calm, comfortable Vitals:   11/29/17 1615 11/29/17 1630 11/29/17 1645 11/29/17 1731  BP: (!) 194/134 (!) 186/130 (!) 191/131 (!) 186/125  Pulse: (!) 103 (!) 106 (!) 110 (!) 107  Resp: 18 20 20  (!) 22  Temp:      TempSrc:      SpO2: 98% 100% 100% 100%   General: Generalized weakness, obese, in distress due to abdominal pain Eyes: PERRL, lids and conjunctivae normal ENMT: Mucous membranes are dry. Posterior pharynx clear of any exudate or lesions.Normal dentition.  Neck: normal, supple, no masses, no thyromegaly Respiratory: clear to auscultation bilaterally, no wheezing, no crackles. Normal respiratory effort. No accessory muscle use.  Cardiovascular: Regular rate and rhythm, no murmurs / rubs / gallops. No extremity edema. 2+ pedal pulses. No carotid bruits.  Abdomen: generalized tenderness, no masses palpated. No hepatosplenomegaly. Bowel sounds positive.  Musculoskeletal: no clubbing / cyanosis. No joint deformity upper and lower extremities. Good ROM, no contractures. Normal muscle tone.  Skin: multiple maculopapular rashes all over her body most likely secondary to SLE lesions, lesions, ulcers. No induration Neurologic: CN 2-12 grossly intact. Sensation intact, DTR normal. Strength 5/5 in all 4.  Psychiatric: Normal judgment and insight. Alert and oriented x 3. Normal mood.   Foley Catheter:None  Labs on Admission: I have personally reviewed following labs and imaging studies  CBC: Recent Labs  Lab  11/23/17 0702 11/24/17 0515 11/29/17 1324  WBC 24.8* 23.6* 17.9*  NEUTROABS  --  17.8*  --   HGB 11.2* 10.6* 14.1  HCT 34.2* 33.3* 41.9  MCV 96.9 99.1 97.2  PLT 145* 182 254   Basic Metabolic Panel: Recent Labs  Lab 11/23/17 0702 11/24/17 0515 11/29/17 1324  NA 137  --  138  K 3.8  --  3.7  CL 104  --  99*  CO2 25  --  21*  GLUCOSE 119*  --  95  BUN 9  --  13  CREATININE 0.84 0.85 1.04*  CALCIUM 9.1  --  9.6   GFR: Estimated Creatinine Clearance: 101.3 mL/min (A) (by C-G formula based on SCr of 1.04 mg/dL (H)). Liver Function Tests: Recent Labs  Lab 11/29/17 1324  AST 35  ALT 20  ALKPHOS 86  BILITOT 1.6*  PROT 8.9*  ALBUMIN 4.0   Recent Labs  Lab 11/29/17 1324  LIPASE 24   No results for input(s): AMMONIA in the last 168 hours. Coagulation Profile: No results for input(s): INR, PROTIME  in the last 168 hours. Cardiac Enzymes: No results for input(s): CKTOTAL, CKMB, CKMBINDEX, TROPONINI in the last 168 hours. BNP (last 3 results) No results for input(s): PROBNP in the last 8760 hours. HbA1C: No results for input(s): HGBA1C in the last 72 hours. CBG: No results for input(s): GLUCAP in the last 168 hours. Lipid Profile: No results for input(s): CHOL, HDL, LDLCALC, TRIG, CHOLHDL, LDLDIRECT in the last 72 hours. Thyroid Function Tests: No results for input(s): TSH, T4TOTAL, FREET4, T3FREE, THYROIDAB in the last 72 hours. Anemia Panel: No results for input(s): VITAMINB12, FOLATE, FERRITIN, TIBC, IRON, RETICCTPCT in the last 72 hours. Urine analysis:    Component Value Date/Time   COLORURINE YELLOW 11/29/2017 1320   APPEARANCEUR CLEAR 11/29/2017 1320   LABSPEC 1.008 11/29/2017 1320   PHURINE 7.0 11/29/2017 1320   GLUCOSEU NEGATIVE 11/29/2017 1320   HGBUR NEGATIVE 11/29/2017 1320   BILIRUBINUR NEGATIVE 11/29/2017 1320   KETONESUR NEGATIVE 11/29/2017 1320   PROTEINUR NEGATIVE 11/29/2017 1320   UROBILINOGEN 1.0 03/30/2015 2328   NITRITE NEGATIVE  11/29/2017 1320   LEUKOCYTESUR NEGATIVE 11/29/2017 1320    Radiological Exams on Admission: Dg Chest 2 View  Result Date: 11/29/2017 CLINICAL DATA:  Short of breath EXAM: CHEST - 2 VIEW COMPARISON:  04/09/2017 FINDINGS: Severe cardiac enlargement with progression from the prior study. Congestive heart failure with mild edema. No effusion or focal consolidation. IMPRESSION: Severe cardiac enlargement with congestive heart failure and mild edema. Electronically Signed   By: Franchot Gallo M.D.   On: 11/29/2017 15:27   Ct Head Wo Contrast  Result Date: 11/29/2017 CLINICAL DATA:  Per EMS pt ongoing n/v and lupus since discharge. Unable to take blood pressure medications related to suchPt. C/o headache EXAM: CT HEAD WITHOUT CONTRAST TECHNIQUE: Contiguous axial images were obtained from the base of the skull through the vertex without intravenous contrast. COMPARISON:  09/14/2017 FINDINGS: Brain: No evidence of acute infarction, hemorrhage, hydrocephalus, extra-axial collection or mass lesion/mass effect. Vascular: No hyperdense vessel or unexpected calcification. Skull: Normal. Negative for fracture or focal lesion. Sinuses/Orbits: Globes and orbits are unremarkable. Visualized sinuses and mastoid air cells are clear. Other: None. IMPRESSION: Normal unenhanced CT scan of the brain. Electronically Signed   By: Lajean Manes M.D.   On: 11/29/2017 15:48   Ct Abdomen Pelvis W Contrast  Result Date: 11/29/2017 CLINICAL DATA:  Nausea and vomiting for several days with abdominal pain EXAM: CT ABDOMEN AND PELVIS WITH CONTRAST TECHNIQUE: Multidetector CT imaging of the abdomen and pelvis was performed using the standard protocol following bolus administration of intravenous contrast. CONTRAST:  174mL ISOVUE-300 IOPAMIDOL (ISOVUE-300) INJECTION 61% COMPARISON:  None. FINDINGS: Lower chest: Lung bases demonstrate left basilar atelectasis as well as some generalized increased density within the lungs consistent with the  congestive changes seen on recent chest x-ray. Small pericardial effusion is noted. Cardiac enlargement is noted. Hepatobiliary: Liver is well visualized and within normal limits. The gallbladder is decompressed. Pancreas: Unremarkable. No pancreatic ductal dilatation or surrounding inflammatory changes. Spleen: Normal in size without focal abnormality. Adrenals/Urinary Tract: Adrenal glands are within normal limits. The kidneys demonstrate a normal enhancement pattern without evidence of calculi or obstructive change. The bladder is well distended. Stomach/Bowel: No colonic abnormality is seen. The appendix is within normal limits. Multiple mildly dilated loops of small bowel are identified in the left mid abdomen and across the midline without obstructive change. Significant venous engorgement of the small bowel is noted. These changes are consistent with small bowel inflammation. No obstructive  changes are noted. Vascular/Lymphatic: No significant vascular findings are present. No enlarged abdominal or pelvic lymph nodes. Reproductive: Uterus and bilateral adnexa are unremarkable. Other: Free fluid is noted within the pelvis likely reactive to the inflammatory change in the small bowel. No free air is seen. Musculoskeletal: No acute bony abnormality is noted. IMPRESSION: Multiple mildly dilated loops of small bowel with thickened edematous walls consistent with underlying small bowel inflammation. No obstructive changes are seen. These changes may be related to the patient's given clinical history of lupus. Mild free fluid within the pelvis likely reactive in nature. No findings to suggest perforation are seen. Changes in the lung bases with left basilar atelectasis and changes of congestive failure. Small pericardial effusion. Electronically Signed   By: Inez Catalina M.D.   On: 11/29/2017 15:50     Assessment/Plan Principal Problem:   Hypertensive urgency Active Problems:   Mixed connective tissue  disease ([place])   Primary hypertension   Lupus erythematosus   Abdominal pain   Enteritis   Nausea vomiting and diarrhea  Hypertensive urgency: Patient has history of hypertension secondary to lupus.  Blood pressure found to be in the range of 200s/100s on presentation.  Started on nicardipine drip. We will continue to monitor blood pressure and monitor her on stepdown unit. She is on amlodipine and hydrochlorothiazide at home.  She has history of uncontrolled hypertension.  Enteritis: Presented with nausea, vomiting, abdominal pain and diarrhea.  CT abdomen/pelvis shows multiple mildly dilated bowel loops with thickened edematous wall.  Etiology unknown at this point.  Could also be from SLE.  We will keep her n.p.o. Will start on empiric antibiotics: Levaquin and Flagyl for possible infectious enteritis for now. Follow-up GI pathogen panel and C. Difficile.  We will consult GI in a.m. if her symptoms do not improve.  Nausea, vomiting and diarrhea: We will keep her n.p.o.  Continue gentle IV fluids.  Continue antiemetics.  History of SLE/mixed connective tissue disease: Follows with rheumatology as an outpatient.  On prednisone 5 mg daily at home.  Also on Plaquenil at home.  Cardiomegaly: Severe cardiomegaly seen on the chest x-ray.  Might also be from hypertensive cardiomyopathy.  Will check echocardiogram.  Her lungs were clear during my evaluation.  Left knee cellulitis: Looks like resolved.  She was admitted with left knee swelling and was just discharged on 11/25/17.  MRI of the left knee showed infrapatellar bursitis.  Orthopedics was consulted at that time.  She was discharged on oral antibiotics which she finished the course.  She also has history of prepatellar septic bursitis of the right knee for which she had undergone I&D procedure in the past .She will follow-up with rheumatology and orthopedics as an outpatient.  Chronic pain syndrome: History of chronic pain .  She has chronic  bilateral knee . continue pain medication as necessary.  Leukocytosis: Looks to have improved since last admission.  She is on steroids at home which may be contributing to leukocytosis.   Severity of Illness:   I certify that at the point of admission it is my clinical judgment that the patient will require inpatient hospital care spanning beyond 2 midnights from the point of admission due to high intensity of service, high risk for further deterioration and high frequency of surveillance required.    DVT prophylaxis: Hep Bath Code Status: Full Family Communication: Mother present at the bedside Consults called: None     Shelly Coss MD [facility] Pager [phone]  If 7PM-7AM, please  contact night-coverage [email or web address] Password [facility]  11/29/2017, 5:41 PM

## 2017-11-29 NOTE — ED Provider Notes (Signed)
[facility]-ICU/STEPDOWN Provider Note   CSN: [id number] Arrival date & time: 11/29/17  1242     History   Chief Complaint Chief Complaint  Patient presents with  . Abdominal Pain  . Emesis    HPI Linda [name] is a 22 y.o. female.  HPI   Pt is a 22 y/o female with a h/o lupus, migraines, mixed connective tissue disorder, HTN who presents to the ED today c/o NVD for the last 4 days. Vomit is yellow and nonbilious. Reports >10 episodes/day. States diarrhea is dark/black and watery >10 episodes per day. Denies blood in stool or vomit. She also reports generalized abd pain that is constant in nature for the last 4 days. States pain is 10/10, and she has never had sxs like this before. Denies sick contacts with similar sxs. Denies recent travel out of the country. She also reports headache that began 4 days ago. States blurry vision began today and dizziness. Denies numbness or weakness. No fevers. No vaginal or urinary sxs.  Also reports shortness of breath beginning 4 days ago. Denies chest pain. Not on birth control. No h/o DVT/PE. No h/o cancer. No swelling, redness, pain to BLE.   States she has tried taking her BP medications, but she has not been able to keep them down due to persistent vomiting.   On review of prior records, Pt was recently admitted to the hospital on 3/25  for suspected cellulitis of left knee. tx with iv abx at that time. MRI showed infra patellar bursitis which could be inflammatory or infectious. Was discharged on keflex and doxy on 3/28. She states sxs of left knee have improved. No fevers.  Past Medical History:  Diagnosis Date  . Lupus [place])   . Migraines   . Mixed connective tissue disease [facility])     Patient Active Problem List   Diagnosis Date Noted  . Hypertensive urgency 11/29/2017  . Enteritis 11/29/2017  . Nausea vomiting and diarrhea 11/29/2017  . Leucocytosis 11/29/2017  . Septic prepatellar bursitis of right knee  08/19/2017  . Grief reaction 08/19/2017  . Right knee pain   . Elbow pain, right   . Back pain 05/04/2017  . Bilateral knee swelling 04/20/2017  . History of connective tissue disease   . Leg swelling   . Abdominal pain   . Lupus erythematosus   . Cellulitis 04/09/2017  . Primary hypertension 12/11/2016  . Mixed connective tissue disease ([place]) 02/11/2016  . High BMI 01/19/2014  . Implanon in place 01/19/2014    Past Surgical History:  Procedure Laterality Date  . I&D EXTREMITY Right 08/06/2017   Procedure: IRRIGATION AND DEBRIDEMENT KNEE;  Surgeon: Shona Needles, MD;  Location: [place];  Service: Orthopedics;  Laterality: Right;  . MULTIPLE TOOTH EXTRACTIONS       OB History    Gravida  0   Para  0   Term  0   Preterm  0   AB  0   Living  0     SAB  0   TAB  0   Ectopic  0   Multiple  0   Live Births               Home Medications    Prior to Admission medications   Medication Sig Start Date End Date Taking? Authorizing Provider  amLODipine (NORVASC) 10 MG tablet Take 1 tablet (10 mg total) by mouth daily. 11/25/17  Yes Rosita Fire, MD  hydrochlorothiazide (HYDRODIURIL) 25 MG tablet Take 1 tablet (25 mg total) by mouth daily. 09/14/17  Yes Pollina, Gwenyth Allegra, MD  oxyCODONE (OXY IR/ROXICODONE) 5 MG immediate release tablet Take 1 tablet (5 mg total) by mouth every 6 (six) hours as needed for breakthrough pain. 08/09/17  Yes Riccio, Angela C, DO  predniSONE (DELTASONE) 10 MG tablet Take 1 tablet (10 mg total) by mouth daily. Take 3 tabs for 2 days, 2 tablets for 2 days, 1 tablet for 2 days and then continue 5 mg daily as you were taking before. 11/25/17  Yes Rosita Fire, MD  tiZANidine (ZANAFLEX) 2 MG tablet Take 1 tablet (2 mg total) by mouth every 8 (eight) hours as needed for muscle spasms. 11/25/17  Yes Rosita Fire, MD    Family History Family History  Problem Relation Age of Onset  . Lupus Mother   . Hypertension  Mother   . Kidney disease Mother   . Diabetes Maternal Grandmother     Social History Social History   Tobacco Use  . Smoking status: Current Every Day Smoker    Packs/day: 0.00    Types: Cigarettes, Cigars    Start date: 02/11/2015  . Smokeless tobacco: Never Used  Substance Use Topics  . Alcohol use: No    Alcohol/week: 0.0 oz    Comment: social  . Drug use: No     Allergies   Vicodin [hydrocodone-acetaminophen]   Review of Systems Review of Systems  Constitutional: Negative for fever.  HENT: Negative for congestion and sinus pain.   Eyes: Positive for visual disturbance.  Respiratory: Positive for shortness of breath. Negative for cough.   Cardiovascular: Negative for chest pain and leg swelling.  Gastrointestinal: Positive for abdominal pain, diarrhea, nausea and vomiting. Negative for constipation.  Genitourinary: Negative for decreased urine volume, dysuria, flank pain, frequency, urgency, vaginal bleeding and vaginal discharge.  Musculoskeletal: Negative for back pain.  Skin: Negative for color change and rash.  Neurological: Positive for headaches. Negative for dizziness, weakness, light-headedness and numbness.     Physical Exam Updated Vital Signs BP (!) 184/126 Comment: in pain  Pulse (!) 101   Temp 98.5 F (36.9 C) (Oral)   Resp 15   Ht 5\' 6"  (1.676 m)   Wt 92.2 kg (203 lb 4.2 oz)   LMP 11/19/2017 Comment: negative HCG blood test 11-29-2017  SpO2 95%   BMI 32.81 kg/m   Physical Exam  Constitutional: She is oriented to person, place, and time. She appears well-developed and well-nourished. No distress.  HENT:  Head: Normocephalic and atraumatic.  Eyes: Pupils are equal, round, and reactive to light. Conjunctivae and EOM are normal.  Neck: Neck supple.  Cardiovascular: Normal rate, regular rhythm, normal heart sounds and intact distal pulses.  No murmur heard. Pulmonary/Chest: Effort normal and breath sounds normal. No respiratory distress.    Abdominal: Soft. Bowel sounds are normal. She exhibits no distension. There is generalized tenderness. There is guarding. There is no rigidity and no rebound.  Musculoskeletal: She exhibits no edema.  No calf erythema, swelling, ttp  Neurological: She is alert and oriented to person, place, and time.  Mental Status:  Alert, thought content appropriate, able to give a coherent history. Speech fluent without evidence of aphasia. Able to follow 2 step commands without difficulty.  Cranial Nerves:  II:  Peripheral visual fields grossly normal, pupils equal, round, reactive to light III,IV, VI: ptosis not present, extra-ocular motions intact bilaterally  V,VII: smile symmetric, facial light touch  sensation equal VIII: hearing grossly normal to voice  X: uvula elevates symmetrically  XI: bilateral shoulder shrug symmetric and strong XII: midline tongue extension without fassiculations Motor:  Normal tone. 5/5 strength of BUE and BLE major muscle groups including strong and equal grip strength and dorsiflexion/plantar flexion Sensory: light touch normal in all extremities. Cerebellar: normal finger-to-nose with bilateral upper extremities CV: 2+ radial and DP/PT pulses No pronator drift  Skin: Skin is warm and dry. Capillary refill takes less than 2 seconds.  Psychiatric: She has a normal mood and affect.  Nursing note and vitals reviewed.    ED Treatments / Results  Labs (all labs ordered are listed, but only abnormal results are displayed) Labs Reviewed  COMPREHENSIVE METABOLIC PANEL - Abnormal; Notable for the following components:      Result Value   Chloride 99 (*)    CO2 21 (*)    Creatinine, Ser 1.04 (*)    Total Protein 8.9 (*)    Total Bilirubin 1.6 (*)    Anion gap 18 (*)    All other components within normal limits  CBC - Abnormal; Notable for the following components:   WBC 17.9 (*)    All other components within normal limits  BRAIN NATRIURETIC PEPTIDE - Abnormal;  Notable for the following components:   B Natriuretic Peptide 1,781.4 (*)    All other components within normal limits  CBC - Abnormal; Notable for the following components:   WBC 17.3 (*)    RDW 15.8 (*)    All other components within normal limits  C DIFFICILE QUICK SCREEN W PCR REFLEX  GASTROINTESTINAL PANEL BY PCR, STOOL (REPLACES STOOL CULTURE)  LIPASE, BLOOD  URINALYSIS, ROUTINE W REFLEX MICROSCOPIC  CREATININE, SERUM  COMPREHENSIVE METABOLIC PANEL  I-STAT BETA HCG BLOOD, ED ([facility], [facility], [facility] ONLY)  I-STAT TROPONIN, ED    EKG EKG Interpretation  Date/Time:  Monday November 29 2017 12:49:45 EDT Ventricular Rate:  96 PR Interval:    QRS Duration: 89 QT Interval:  348 QTC Calculation: 440 R Axis:   75 Text Interpretation:  Sinus rhythm Biatrial enlargement Probable left ventricular hypertrophy Repol abnrm suggests ischemia, inferior leads Borderline ST elevation, lateral leads Confirmed by Nat Christen [phone]) on 11/29/2017 3:14:40 PM   Radiology Dg Chest 2 View  Result Date: 11/29/2017 CLINICAL DATA:  Short of breath EXAM: CHEST - 2 VIEW COMPARISON:  04/09/2017 FINDINGS: Severe cardiac enlargement with progression from the prior study. Congestive heart failure with mild edema. No effusion or focal consolidation. IMPRESSION: Severe cardiac enlargement with congestive heart failure and mild edema. Electronically Signed   By: Franchot Gallo M.D.   On: 11/29/2017 15:27   Ct Head Wo Contrast  Result Date: 11/29/2017 CLINICAL DATA:  Per EMS pt ongoing n/v and lupus since discharge. Unable to take blood pressure medications related to suchPt. C/o headache EXAM: CT HEAD WITHOUT CONTRAST TECHNIQUE: Contiguous axial images were obtained from the base of the skull through the vertex without intravenous contrast. COMPARISON:  09/14/2017 FINDINGS: Brain: No evidence of acute infarction, hemorrhage, hydrocephalus, extra-axial collection or mass lesion/mass effect. Vascular: No hyperdense vessel or  unexpected calcification. Skull: Normal. Negative for fracture or focal lesion. Sinuses/Orbits: Globes and orbits are unremarkable. Visualized sinuses and mastoid air cells are clear. Other: None. IMPRESSION: Normal unenhanced CT scan of the brain. Electronically Signed   By: Lajean Manes M.D.   On: 11/29/2017 15:48   Ct Abdomen Pelvis W Contrast  Result Date: 11/29/2017 CLINICAL DATA:  Nausea  and vomiting for several days with abdominal pain EXAM: CT ABDOMEN AND PELVIS WITH CONTRAST TECHNIQUE: Multidetector CT imaging of the abdomen and pelvis was performed using the standard protocol following bolus administration of intravenous contrast. CONTRAST:  148mL ISOVUE-300 IOPAMIDOL (ISOVUE-300) INJECTION 61% COMPARISON:  None. FINDINGS: Lower chest: Lung bases demonstrate left basilar atelectasis as well as some generalized increased density within the lungs consistent with the congestive changes seen on recent chest x-ray. Small pericardial effusion is noted. Cardiac enlargement is noted. Hepatobiliary: Liver is well visualized and within normal limits. The gallbladder is decompressed. Pancreas: Unremarkable. No pancreatic ductal dilatation or surrounding inflammatory changes. Spleen: Normal in size without focal abnormality. Adrenals/Urinary Tract: Adrenal glands are within normal limits. The kidneys demonstrate a normal enhancement pattern without evidence of calculi or obstructive change. The bladder is well distended. Stomach/Bowel: No colonic abnormality is seen. The appendix is within normal limits. Multiple mildly dilated loops of small bowel are identified in the left mid abdomen and across the midline without obstructive change. Significant venous engorgement of the small bowel is noted. These changes are consistent with small bowel inflammation. No obstructive changes are noted. Vascular/Lymphatic: No significant vascular findings are present. No enlarged abdominal or pelvic lymph nodes. Reproductive:  Uterus and bilateral adnexa are unremarkable. Other: Free fluid is noted within the pelvis likely reactive to the inflammatory change in the small bowel. No free air is seen. Musculoskeletal: No acute bony abnormality is noted. IMPRESSION: Multiple mildly dilated loops of small bowel with thickened edematous walls consistent with underlying small bowel inflammation. No obstructive changes are seen. These changes may be related to the patient's given clinical history of lupus. Mild free fluid within the pelvis likely reactive in nature. No findings to suggest perforation are seen. Changes in the lung bases with left basilar atelectasis and changes of congestive failure. Small pericardial effusion. Electronically Signed   By: Inez Catalina M.D.   On: 11/29/2017 15:50    Procedures Procedures (including critical care time)   CRITICAL CARE Performed by: Rodney Booze   Total critical care time: 35 minutes  Critical care time was exclusive of separately billable procedures and treating other patients.  Critical care was necessary to treat or prevent imminent or life-threatening deterioration.  Critical care was time spent personally by me on the following activities: development of treatment plan with patient and/or surrogate as well as nursing, discussions with consultants, evaluation of patient's response to treatment, examination of patient, obtaining history from patient or surrogate, ordering and performing treatments and interventions, ordering and review of laboratory studies, ordering and review of radiographic studies, pulse oximetry and re-evaluation of patient's condition.   Medications Ordered in ED Medications  ondansetron (ZOFRAN) injection 4 mg (4 mg Intravenous Refused 11/29/17 1534)  HYDROmorphone (DILAUDID) injection 1 mg (1 mg Intravenous Given 11/30/17 0006)  amLODipine (NORVASC) tablet 10 mg (has no administration in time range)  hydrochlorothiazide (HYDRODIURIL) tablet 25 mg  (has no administration in time range)  predniSONE (DELTASONE) tablet 5 mg (has no administration in time range)  heparin injection 5,000 Units (5,000 Units Subcutaneous Given 11/29/17 2130)  ondansetron (ZOFRAN) injection 4 mg (has no administration in time range)  0.9 %  sodium chloride infusion ( Intravenous Restarted 11/29/17 2100)  levofloxacin (LEVAQUIN) IVPB 500 mg (0 mg Intravenous Stopped 11/29/17 2155)  metroNIDAZOLE (FLAGYL) IVPB 500 mg (0 mg Intravenous Stopped 11/29/17 2233)  nicardipine (CARDENE) 20mg  in 0.86% saline 248ml IV infusion (0.1 mg/ml) (5 mg/hr Intravenous Restarted 11/29/17 2057)  ondansetron (ZOFRAN) injection 4 mg (4 mg Intravenous Given 11/29/17 1348)  sodium chloride 0.9 % bolus 1,000 mL (0 mLs Intravenous Stopped 11/29/17 1539)  nicardipine (CARDENE) 20mg  in 0.86% saline 251ml IV infusion (0.1 mg/ml) (5 mg/hr Intravenous Transfusing/Transfer 11/29/17 1853)  HYDROmorphone (DILAUDID) injection 1 mg (1 mg Intravenous Given 11/29/17 1451)  iopamidol (ISOVUE-300) 61 % injection (100 mLs Intravenous Contrast Given 11/29/17 1519)  HYDROmorphone (DILAUDID) injection 1 mg (1 mg Intravenous Given 11/29/17 1731)  sodium chloride 0.9 % bolus 1,000 mL (0 mLs Intravenous Stopped 11/29/17 1801)     Initial Impression / Assessment and Plan / ED Course  I have reviewed the triage vital signs and the nursing notes.  Pertinent labs & imaging results that were available during my care of the patient were reviewed by me and considered in my medical decision making (see chart for details).  Discussed pt presentation and exam findings with Dr. Lacinda Axon, who still evaluated the patient and agrees with the current workup and plan for admission for hypertension. Does not feel that torsion r/o studies necessary at this time however recommends ct abd/pelvis IV contrast.   3:45 PM reevaluated patient.  States that her headache is completely resolved and her abdominal pain has improved since administering Dilaudid.   Has had no episodes of vomiting or diarrhea since being in the ED.  BP 199/148 on cardene drip.  Reevaluated the patient and explained the plan for admission.  Explained the results of the workup thus far.  Patient understands plan for admission and agrees to stay.   Final Clinical Impressions(s) / ED Diagnoses   Final diagnoses:  Hypertensive emergency  Acute congestive heart failure, unspecified heart failure type (HCC)  Pericardial effusion  Nausea vomiting and diarrhea   22 year old female presenting with nausea vomiting diarrhea and abdominal pain.  Hypertensive in the 094B systolic on initial presentation.  Also slightly tachycardic.  Patient is afebrile with SPO2 95% on room air and respirations 15.   Given elevated blood pressure and patient's inability to tolerate home BP meds she is placed on Cardene drip for blood pressure control.  CT head was ordered without contrast and did not show any evidence of intracranial hemorrhage or abnormality.  Patient's headache completely resolved after 1 mg Dilaudid.  She did not exhibit any gross visual changes or neurologic deficits on my exam.  She also complained of some shortness of breath, no associated URI symptoms.  Chest x-ray showed cardiomegaly with congestive heart failure and edema.  ECG with normal sinus rhythm heart rate 96.  Biatrial enlargement and left ventricular hypertrophy.  Abnormal repolarization which suggests ischemia in the inferior leads.  She also had borderline ST elevation in the lateral leads.  Her troponin was negative.  Small pericardial effusion also noted on CT of the pelvis.  With respect to nausea vomiting diarrhea, unclear etiology.  C. difficile is pending.  Patient recently on IV antibiotics.  She also has multiple mildly dilated loops of bowel with thickened edematous walls consistent with small bowel inflammation.  No obstructive changes and no evidence of perforation noted on CT scan.  This could be due to her  current symptoms of nausea, vomiting, diarrhea or could be due to SLE.   Plan for admission to treat hypertensive urgency as well as further treatment of her enteritis.  Discussed patient case with Dr. Tawanna Solo who will admit the patient into his care.  ED Discharge Orders    None       Couture, Cortni  S, PA-C 11/30/17 9628    Nat Christen, MD 12/03/17 1343

## 2017-11-29 NOTE — ED Notes (Signed)
Bed: UX32 Expected date:  Expected time:  Means of arrival:  Comments: EMS n/v hypertensive

## 2017-11-29 NOTE — ED Notes (Signed)
ED TO INPATIENT HANDOFF REPORT  Name/Age/Gender Linda [name] 22 y.o. female  Code Status    Code Status Orders  (From admission, onward)        Start     Ordered   11/29/17 1725  Full code  Continuous     11/29/17 1740    Code Status History    Date Active Date Inactive Code Status Order ID Comments User Context   11/22/2017 0955 11/25/2017 1422 Full Code [id number]  Patrecia Pour, MD Inpatient   08/02/2017 2005 08/09/2017 1753 Full Code [id number]  Steve Rattler, DO ED   04/20/2017 2033 04/22/2017 2105 Full Code [id number]  Carlyle Dolly, MD Inpatient   04/09/2017 1351 04/14/2017 2123 Full Code [id number]  Smiley Houseman, MD ED      Home/SNF/Other Home  Chief Complaint abd pain   Level of Care/Admitting Diagnosis ED Disposition    ED Disposition Condition [facility] Area: [facility] [[id number]]  Level of Care: Stepdown [14]  Admit to SDU based on following criteria: Hemodynamic compromise or significant risk of instability:  Patient requiring short term acute titration and management of vasoactive drips, and invasive monitoring (i.e., CVP and Arterial line).  Diagnosis: Hypertensive urgency [546503]  Admitting Physician: Shelly Coss [[id number]]  Attending Physician: Shelly Coss [[id number]]  Estimated length of stay: past midnight tomorrow  Certification:: I certify this patient will need inpatient services for at least 2 midnights  PT Class (Do Not Modify): Inpatient [101]  PT Acc Code (Do Not Modify): Private [1]       Medical History Past Medical History:  Diagnosis Date  . Lupus ([place])   . Migraines   . Mixed connective tissue disease (HCC)     Allergies Allergies  Allergen Reactions  . Vicodin [Hydrocodone-Acetaminophen] Itching and Rash    IV Location/Drains/Wounds Patient Lines/Drains/Airways Status   Active Line/Drains/Airways    Name:   Placement date:   Placement time:   Site:   Days:    Peripheral IV 11/29/17 Left Forearm   11/29/17    1325    Forearm   less than 1   Incision (Closed) 08/06/17 Knee Right   08/06/17    0804     115          Labs/Imaging Results for orders placed or performed during the hospital encounter of 11/29/17 (from the past 48 hour(s))  Urinalysis, Routine w reflex microscopic     Status: None   Collection Time: 11/29/17  1:20 PM  Result Value Ref Range   Color, Urine YELLOW YELLOW   APPearance CLEAR CLEAR   Specific Gravity, Urine 1.008 1.005 - 1.030   pH 7.0 5.0 - 8.0   Glucose, UA NEGATIVE NEGATIVE mg/dL   Hgb urine dipstick NEGATIVE NEGATIVE   Bilirubin Urine NEGATIVE NEGATIVE   Ketones, ur NEGATIVE NEGATIVE mg/dL   Protein, ur NEGATIVE NEGATIVE mg/dL   Nitrite NEGATIVE NEGATIVE   Leukocytes, UA NEGATIVE NEGATIVE    Comment: Performed at [facility], [place] [address] [place], [place] [address]  Lipase, blood     Status: None   Collection Time: 11/29/17  1:24 PM  Result Value Ref Range   Lipase 24 11 - 51 U/L    Comment: Performed at [facility], [place] [address] [place], [place] [address]  Comprehensive metabolic panel     Status: Abnormal   Collection Time: 11/29/17  1:24 PM  Result Value Ref Range  Sodium 138 135 - 145 mmol/L   Potassium 3.7 3.5 - 5.1 mmol/L   Chloride 99 (L) 101 - 111 mmol/L   CO2 21 (L) 22 - 32 mmol/L   Glucose, Bld 95 65 - 99 mg/dL   BUN 13 6 - 20 mg/dL   Creatinine, Ser 1.04 (H) 0.44 - 1.00 mg/dL   Calcium 9.6 8.9 - 10.3 mg/dL   Total Protein 8.9 (H) 6.5 - 8.1 g/dL   Albumin 4.0 3.5 - 5.0 g/dL   AST 35 15 - 41 U/L   ALT 20 14 - 54 U/L   Alkaline Phosphatase 86 38 - 126 U/L   Total Bilirubin 1.6 (H) 0.3 - 1.2 mg/dL   GFR calc non Af Amer >60 >60 mL/min   GFR calc Af Amer >60 >60 mL/min    Comment: (NOTE) The eGFR has been calculated using the CKD EPI equation. This calculation has not been validated in all clinical situations. eGFR's persistently <60 mL/min  signify possible Chronic Kidney Disease.    Anion gap 18 (H) 5 - 15    Comment: Performed at [facility], [place] [address], [place], [place] [address]  CBC     Status: Abnormal   Collection Time: 11/29/17  1:24 PM  Result Value Ref Range   WBC 17.9 (H) 4.0 - 10.5 K/uL   RBC 4.31 3.87 - 5.11 MIL/uL   Hemoglobin 14.1 12.0 - 15.0 g/dL   HCT 41.9 36.0 - 46.0 %   MCV 97.2 78.0 - 100.0 fL   MCH 32.7 26.0 - 34.0 pg   MCHC 33.7 30.0 - 36.0 g/dL   RDW 15.5 11.5 - 15.5 %   Platelets 233 150 - 400 K/uL    Comment: Performed at [facility], [place] [address], [place], [place] [address]  Brain natriuretic peptide     Status: Abnormal   Collection Time: 11/29/17  1:24 PM  Result Value Ref Range   B Natriuretic Peptide 1,781.4 (H) 0.0 - 100.0 pg/mL    Comment: Performed at [facility], [place] [address] [place], [place] [address]  I-Stat beta hCG blood, ED     Status: None   Collection Time: 11/29/17  1:29 PM  Result Value Ref Range   I-stat hCG, quantitative <5.0 <5 mIU/mL   Comment 3            Comment:   GEST. AGE      CONC.  (mIU/mL)   <=1 WEEK        5 - 50     2 WEEKS       50 - 500     3 WEEKS       100 - 10,000     4 WEEKS     1,000 - 30,000        FEMALE AND NON-PREGNANT FEMALE:     LESS THAN 5 mIU/mL   I-Stat Troponin, ED (not at [facility])     Status: None   Collection Time: 11/29/17  2:56 PM  Result Value Ref Range   Troponin i, poc 0.00 0.00 - 0.08 ng/mL   Comment 3            Comment: Due to the release kinetics of cTnI, a negative result within the first hours of the onset of symptoms does not rule out myocardial infarction with certainty. If myocardial infarction is still suspected, repeat the test at appropriate intervals.    Dg Chest 2 View  Result Date: 11/29/2017 CLINICAL DATA:  Short of breath EXAM: CHEST - 2 VIEW COMPARISON:  04/09/2017 FINDINGS: Severe cardiac enlargement with progression from the prior study.  Congestive heart failure with mild edema. No effusion or focal consolidation. IMPRESSION: Severe cardiac enlargement with congestive heart failure and mild edema. Electronically Signed   By: Franchot Gallo M.D.   On: 11/29/2017 15:27   Ct Head Wo Contrast  Result Date: 11/29/2017 CLINICAL DATA:  Per EMS pt ongoing n/v and lupus since discharge. Unable to take blood pressure medications related to suchPt. C/o headache EXAM: CT HEAD WITHOUT CONTRAST TECHNIQUE: Contiguous axial images were obtained from the base of the skull through the vertex without intravenous contrast. COMPARISON:  09/14/2017 FINDINGS: Brain: No evidence of acute infarction, hemorrhage, hydrocephalus, extra-axial collection or mass lesion/mass effect. Vascular: No hyperdense vessel or unexpected calcification. Skull: Normal. Negative for fracture or focal lesion. Sinuses/Orbits: Globes and orbits are unremarkable. Visualized sinuses and mastoid air cells are clear. Other: None. IMPRESSION: Normal unenhanced CT scan of the brain. Electronically Signed   By: Lajean Manes M.D.   On: 11/29/2017 15:48   Ct Abdomen Pelvis W Contrast  Result Date: 11/29/2017 CLINICAL DATA:  Nausea and vomiting for several days with abdominal pain EXAM: CT ABDOMEN AND PELVIS WITH CONTRAST TECHNIQUE: Multidetector CT imaging of the abdomen and pelvis was performed using the standard protocol following bolus administration of intravenous contrast. CONTRAST:  126m ISOVUE-300 IOPAMIDOL (ISOVUE-300) INJECTION 61% COMPARISON:  None. FINDINGS: Lower chest: Lung bases demonstrate left basilar atelectasis as well as some generalized increased density within the lungs consistent with the congestive changes seen on recent chest x-ray. Small pericardial effusion is noted. Cardiac enlargement is noted. Hepatobiliary: Liver is well visualized and within normal limits. The gallbladder is decompressed. Pancreas: Unremarkable. No pancreatic ductal dilatation or surrounding  inflammatory changes. Spleen: Normal in size without focal abnormality. Adrenals/Urinary Tract: Adrenal glands are within normal limits. The kidneys demonstrate a normal enhancement pattern without evidence of calculi or obstructive change. The bladder is well distended. Stomach/Bowel: No colonic abnormality is seen. The appendix is within normal limits. Multiple mildly dilated loops of small bowel are identified in the left mid abdomen and across the midline without obstructive change. Significant venous engorgement of the small bowel is noted. These changes are consistent with small bowel inflammation. No obstructive changes are noted. Vascular/Lymphatic: No significant vascular findings are present. No enlarged abdominal or pelvic lymph nodes. Reproductive: Uterus and bilateral adnexa are unremarkable. Other: Free fluid is noted within the pelvis likely reactive to the inflammatory change in the small bowel. No free air is seen. Musculoskeletal: No acute bony abnormality is noted. IMPRESSION: Multiple mildly dilated loops of small bowel with thickened edematous walls consistent with underlying small bowel inflammation. No obstructive changes are seen. These changes may be related to the patient's given clinical history of lupus. Mild free fluid within the pelvis likely reactive in nature. No findings to suggest perforation are seen. Changes in the lung bases with left basilar atelectasis and changes of congestive failure. Small pericardial effusion. Electronically Signed   By: MInez CatalinaM.D.   On: 11/29/2017 15:50    Pending Labs Unresulted Labs (From admission, onward)   Start     Ordered   11/30/17 0500  Comprehensive metabolic panel  Tomorrow morning,   R     11/29/17 1740   11/29/17 1725  CBC  (heparin)  Once,   R    Comments:  Baseline for heparin therapy IF  NOT ALREADY DRAWN.  Notify MD if PLT < 100 K.    11/29/17 1740   11/29/17 1725  Creatinine, serum  (heparin)  Once,   R    Comments:   Baseline for heparin therapy IF NOT ALREADY DRAWN.    11/29/17 1740   11/29/17 1723  Gastrointestinal Panel by PCR , Stool  (Gastrointestinal Panel by PCR, Stool)  Once,   R     11/29/17 1722   11/29/17 1545  C difficile quick scan w PCR reflex  (C Difficile quick screen w PCR reflex panel)  Once, for 24 hours,   R     11/29/17 1544      Vitals/Pain Today's Vitals   11/29/17 1645 11/29/17 1650 11/29/17 1731 11/29/17 1753  BP: (!) 191/131  (!) 186/125   Pulse: (!) 110  (!) 107   Resp: 20  (!) 22   Temp:      TempSrc:      SpO2: 100%  100%   PainSc:  9   Asleep    Isolation Precautions Enteric precautions (UV disinfection)  Medications Medications  ondansetron (ZOFRAN) injection 4 mg (4 mg Intravenous Refused 11/29/17 1534)  sodium chloride 0.9 % bolus 1,000 mL (1,000 mLs Intravenous New Bag/Given 11/29/17 1731)  HYDROmorphone (DILAUDID) injection 1 mg (has no administration in time range)  amLODipine (NORVASC) tablet 10 mg (has no administration in time range)  hydrochlorothiazide (HYDRODIURIL) tablet 25 mg (has no administration in time range)  predniSONE (DELTASONE) tablet 5 mg (has no administration in time range)  heparin injection 5,000 Units (has no administration in time range)  ondansetron (ZOFRAN) injection 4 mg (has no administration in time range)  0.9 %  sodium chloride infusion (has no administration in time range)  ondansetron (ZOFRAN) injection 4 mg (4 mg Intravenous Given 11/29/17 1348)  sodium chloride 0.9 % bolus 1,000 mL (0 mLs Intravenous Stopped 11/29/17 1539)  nicardipine (CARDENE) 13m in 0.86% saline 2094mIV infusion (0.1 mg/ml) (7.5 mg/hr Intravenous Rate/Dose Change 11/29/17 1732)  HYDROmorphone (DILAUDID) injection 1 mg (1 mg Intravenous Given 11/29/17 1451)  iopamidol (ISOVUE-300) 61 % injection (100 mLs Intravenous Contrast Given 11/29/17 1519)  HYDROmorphone (DILAUDID) injection 1 mg (1 mg Intravenous Given 11/29/17 1731)    Mobility walks

## 2017-11-30 ENCOUNTER — Inpatient Hospital Stay (HOSPITAL_COMMUNITY): Payer: Medicaid Other

## 2017-11-30 ENCOUNTER — Encounter (HOSPITAL_COMMUNITY): Payer: Self-pay | Admitting: Gastroenterology

## 2017-11-30 DIAGNOSIS — I340 Nonrheumatic mitral (valve) insufficiency: Secondary | ICD-10-CM

## 2017-11-30 LAB — COMPREHENSIVE METABOLIC PANEL
ALT: 22 U/L (ref 14–54)
AST: 50 U/L — ABNORMAL HIGH (ref 15–41)
Albumin: 3.5 g/dL (ref 3.5–5.0)
Alkaline Phosphatase: 77 U/L (ref 38–126)
Anion gap: 13 (ref 5–15)
BUN: 11 mg/dL (ref 6–20)
CO2: 22 mmol/L (ref 22–32)
Calcium: 8.6 mg/dL — ABNORMAL LOW (ref 8.9–10.3)
Chloride: 102 mmol/L (ref 101–111)
Creatinine, Ser: 1.01 mg/dL — ABNORMAL HIGH (ref 0.44–1.00)
GFR calc Af Amer: >60 mL/min (ref >60)
GFR calc non Af Amer: >60 mL/min (ref >60)
Glucose, Bld: 112 mg/dL — ABNORMAL HIGH (ref 65–99)
Potassium: 5.1 mmol/L (ref 3.5–5.1)
Sodium: 137 mmol/L (ref 135–145)
Total Bilirubin: 1.4 mg/dL — ABNORMAL HIGH (ref 0.3–1.2)
Total Protein: 8 g/dL (ref 6.5–8.1)

## 2017-11-30 LAB — ECHOCARDIOGRAM COMPLETE
Height: 66 in
Weight: 3248.7 oz

## 2017-11-30 LAB — C DIFFICILE QUICK SCREEN W PCR REFLEX
C Diff antigen: NEGATIVE
C Diff interpretation: NOT DETECTED
C Diff toxin: NEGATIVE

## 2017-11-30 MED ORDER — KETOROLAC TROMETHAMINE 15 MG/ML IJ SOLN
15.0000 mg | Freq: Three times a day (TID) | INTRAMUSCULAR | Status: DC | PRN
  Administered 2017-11-30 – 2017-12-01 (×3): 15 mg via INTRAVENOUS
  Filled 2017-11-30 (×4): qty 1

## 2017-11-30 MED ORDER — NICARDIPINE HCL IN NACL 20-0.86 MG/200ML-% IV SOLN
3.0000 mg/h | INTRAVENOUS | Status: DC
  Administered 2017-11-30 – 2017-12-01 (×4): 10 mg/h via INTRAVENOUS
  Administered 2017-12-01: 7.5 mg/h via INTRAVENOUS
  Filled 2017-11-30 (×6): qty 200

## 2017-11-30 MED ORDER — METHYLPREDNISOLONE SODIUM SUCC 125 MG IJ SOLR
60.0000 mg | Freq: Every day | INTRAMUSCULAR | Status: DC
  Administered 2017-11-30 – 2017-12-02 (×3): 60 mg via INTRAVENOUS
  Filled 2017-11-30 (×3): qty 2

## 2017-11-30 MED ORDER — LIP MEDEX EX OINT
TOPICAL_OINTMENT | CUTANEOUS | Status: DC | PRN
  Administered 2017-11-30: 15:00:00 via TOPICAL

## 2017-11-30 MED ORDER — LIP MEDEX EX OINT
TOPICAL_OINTMENT | CUTANEOUS | Status: AC
  Filled 2017-11-30: qty 7

## 2017-11-30 NOTE — Progress Notes (Signed)
  Echocardiogram 2D Echocardiogram has been performed.  Linda [name] 11/30/2017, 2:40 PM

## 2017-11-30 NOTE — Progress Notes (Signed)
PROGRESS NOTE    Linda [name]  MPN:[id number] DOB: [birth year] DOA: 11/29/2017 PCP: Tonette Bihari, MD   Brief Narrative:  Linda [name] is a 22 y.o. female with medical history significant of SLE, mixed connective tissue disorder, hypertension, migraine who presented to the emergency department today with complaints of nausea, vomiting, abdominal pain, diarrhea.  Patient was just discharged on 11/25/17 after treatment of cellulitis and infrapatellar bursitis of the left knee.  Patient was discharged on oral antibiotics   which she finished at home. Patient started having nausea, vomiting, abdomen pain and diarrhea for 4 days.  She was complaining of severe abdominal pain.  She reported having multiple bowel movements today which were loose and watery.  Patient noted to have severe tenderness of the abdomen.   Patient has history of SLE and mixed connective tissue disorder and follows with rheumatology as an outpatient.  She is on prednisone 5 mg daily at home. CT abdomen showed:Multiple mildly dilated loops of small bowel with thickened edematous walls consistent with underlying small bowel inflammation.GI consulted today.  Assessment & Plan:   Principal Problem:   Hypertensive urgency Active Problems:   Mixed connective tissue disease ([place])   Primary hypertension   Lupus erythematosus   Abdominal pain   Enteritis   Nausea vomiting and diarrhea   Leucocytosis   Hypertensive urgency: Patient has history of hypertension secondary to lupus.  Blood pressure found to be in the range of 200s/100s on presentation.  Started on nicardipine drip. We will continue to monitor blood pressure and monitor her on stepdown unit. She is on amlodipine and hydrochlorothiazide at home.  She has history of uncontrolled hypertension.  Enteritis: Presented with nausea, vomiting, abdominal pain and diarrhea.  CT abdomen/pelvis shows multiple mildly dilated bowel loops with thickened edematous wall.   Etiology unknown at this point.  Could also be from SLE. Started on empiric antibiotics: Levaquin and Flagyl for possible infectious enteritis for now. Follow-up GI pathogen panel . C. Difficile negative. GI following. The above CT findings could also be started with SLE so she was started on stress dose  of steroids. Will start her on clears today.  Nausea, vomiting and diarrhea:   Continue gentle IV fluids.  Continue antiemetics.These symptoms have improved.  History of SLE/mixed connective tissue disease: Follows with rheumatology as an outpatient.  On prednisone 5 mg daily at home.  Also on Plaquenil at home.Started on Solumedrol here.  Cardiomegaly: Severe cardiomegaly seen on the chest x-ray.  Might also be from hypertensive cardiomyopathy.  Will check echocardiogram.  Her lungs were clear during my evaluation.  Left knee cellulitis: Looks like resolved.  She was admitted with left knee swelling and was just discharged on 11/25/17.  MRI of the left knee showed infrapatellar bursitis.  Orthopedics was consulted at that time.  She was discharged on oral antibiotics which she finished the course.  She also has history of prepatellar septic bursitis of the right knee for which she had undergone I&D procedure in the past .She will follow-up with rheumatology and orthopedics as an outpatient.  Chronic pain syndrome: History of chronic pain .  She has chronic bilateral knee . Continue pain medication as necessary.  Leukocytosis: Looks to have improved since last admission.  She is on steroids at home which may be contributing to leukocytosis.     DVT prophylaxis:  Heparin [place] Code Status: Full Family Communication: None present at the bedside Disposition Plan: Home after resolution of her abdominal symptoms  Consultants: Gastroenterology  Procedures: None  Antimicrobials: Levaquin and Flagyl since 4 /1/19  Subjective: Patient seen and examined the bedside this morning.  Her  nausea, vomiting and diarrhea have improved.  Still complains of abdominal pain.  No overnight fever.  Objective: Vitals:   11/30/17 0800 11/30/17 1000 11/30/17 1100 11/30/17 1138  BP: (!) 178/116 (!) 165/99 (!) 158/106   Pulse: 90     Resp: 17 17 14    Temp: 98.6 F (37 C)   98.1 F (36.7 C)  TempSrc: Oral   Oral  SpO2: 100% 98%    Weight:      Height:        Intake/Output Summary (Last 24 hours) at 11/30/2017 1239 Last data filed at 11/30/2017 1200 Gross per 24 hour  Intake 3657.92 ml  Output 3300 ml  Net 357.92 ml   Filed Weights   11/29/17 2315 11/30/17 0400  Weight: 92.2 kg (203 lb 4.2 oz) 92.1 kg (203 lb 0.7 oz)    Examination:  General exam: Not in distress,average built HEENT:PERRL,Oral mucosa moist, Ear/Nose normal on gross exam Respiratory system: Bilateral equal air entry, normal vesicular breath sounds, no wheezes or crackles  Cardiovascular system: S1 & S2 heard, RRR. No JVD, murmurs, rubs, gallops or clicks. No pedal edema. Gastrointestinal system: Abdomen is mildly distended, soft and has generalized tenderness. No organomegaly or masses felt. Normal bowel sounds heard. Central nervous system: Alert and oriented. No focal neurological deficits. Extremities: Trace edema, no clubbing ,no cyanosis, distal peripheral pulses palpable. Skin: Healing Maculopapular rashes, no icterus ,no pallor MSK: Normal muscle bulk,tone ,power Psychiatry: Judgement and insight appear normal. Mood & affect appropriate.     Data Reviewed: I have personally reviewed following labs and imaging studies  CBC: Recent Labs  Lab 11/24/17 0515 11/29/17 1324 11/29/17 1927  WBC 23.6* 17.9* 17.3*  NEUTROABS 17.8*  --   --   HGB 10.6* 14.1 13.2  HCT 33.3* 41.9 41.1  MCV 99.1 97.2 98.8  PLT 182 233 998   Basic Metabolic Panel: Recent Labs  Lab 11/24/17 0515 11/29/17 1324 11/29/17 1927 11/30/17 0339  NA  --  138  --  137  K  --  3.7  --  5.1  CL  --  99*  --  102  CO2  --   21*  --  22  GLUCOSE  --  95  --  112*  BUN  --  13  --  11  CREATININE 0.85 1.04* 0.87 1.01*  CALCIUM  --  9.6  --  8.6*   GFR: Estimated Creatinine Clearance: 99.9 mL/min (A) (by C-G formula based on SCr of 1.01 mg/dL (H)). Liver Function Tests: Recent Labs  Lab 11/29/17 1324 11/30/17 0339  AST 35 50*  ALT 20 22  ALKPHOS 86 77  BILITOT 1.6* 1.4*  PROT 8.9* 8.0  ALBUMIN 4.0 3.5   Recent Labs  Lab 11/29/17 1324  LIPASE 24   No results for input(s): AMMONIA in the last 168 hours. Coagulation Profile: No results for input(s): INR, PROTIME in the last 168 hours. Cardiac Enzymes: No results for input(s): CKTOTAL, CKMB, CKMBINDEX, TROPONINI in the last 168 hours. BNP (last 3 results) No results for input(s): PROBNP in the last 8760 hours. HbA1C: No results for input(s): HGBA1C in the last 72 hours. CBG: No results for input(s): GLUCAP in the last 168 hours. Lipid Profile: No results for input(s): CHOL, HDL, LDLCALC, TRIG, CHOLHDL, LDLDIRECT in the last 72 hours. Thyroid Function Tests:  No results for input(s): TSH, T4TOTAL, FREET4, T3FREE, THYROIDAB in the last 72 hours. Anemia Panel: No results for input(s): VITAMINB12, FOLATE, FERRITIN, TIBC, IRON, RETICCTPCT in the last 72 hours. Sepsis Labs: No results for input(s): PROCALCITON, LATICACIDVEN in the last 168 hours.  Recent Results (from the past 240 hour(s))  Blood Culture (routine x 2)     Status: None   Collection Time: 11/22/17  2:33 AM  Result Value Ref Range Status   Specimen Description   Final    RIGHT ANTECUBITAL Performed at [facility] [address], [place], [place] [address]    Special Requests   Final    BOTTLES DRAWN AEROBIC AND ANAEROBIC Blood Culture results may not be optimal due to an inadequate volume of blood received in culture bottles Performed at [facility] [address], [place], [place] [address]    Culture   Final    NO GROWTH 5  DAYS Performed at [facility], [place] [address] [place], [place] [address]    Report Status 11/27/2017 FINAL  Final  MRSA PCR Screening     Status: None   Collection Time: 11/23/17 10:31 AM  Result Value Ref Range Status   MRSA by PCR NEGATIVE NEGATIVE Final    Comment:        The GeneXpert MRSA Assay (FDA approved for NASAL specimens only), is one component of a comprehensive MRSA colonization surveillance program. It is not intended to diagnose MRSA infection nor to guide or monitor treatment for MRSA infections. Performed at [facility], [place] [address], [place], [place] [address]   C difficile quick scan w PCR reflex     Status: None   Collection Time: 11/30/17  3:25 AM  Result Value Ref Range Status   C Diff antigen NEGATIVE NEGATIVE Final   C Diff toxin NEGATIVE NEGATIVE Final   C Diff interpretation No C. difficile detected.  Final    Comment: Performed at [facility], [place] [address], [place], [place] [address]         Radiology Studies: Dg Chest 2 View  Result Date: 11/29/2017 CLINICAL DATA:  Short of breath EXAM: CHEST - 2 VIEW COMPARISON:  04/09/2017 FINDINGS: Severe cardiac enlargement with progression from the prior study. Congestive heart failure with mild edema. No effusion or focal consolidation. IMPRESSION: Severe cardiac enlargement with congestive heart failure and mild edema. Electronically Signed   By: Franchot Gallo M.D.   On: 11/29/2017 15:27   Ct Head Wo Contrast  Result Date: 11/29/2017 CLINICAL DATA:  Per EMS pt ongoing n/v and lupus since discharge. Unable to take blood pressure medications related to suchPt. C/o headache EXAM: CT HEAD WITHOUT CONTRAST TECHNIQUE: Contiguous axial images were obtained from the base of the skull through the vertex without intravenous contrast. COMPARISON:  09/14/2017 FINDINGS: Brain: No evidence of acute infarction, hemorrhage, hydrocephalus, extra-axial collection or mass  lesion/mass effect. Vascular: No hyperdense vessel or unexpected calcification. Skull: Normal. Negative for fracture or focal lesion. Sinuses/Orbits: Globes and orbits are unremarkable. Visualized sinuses and mastoid air cells are clear. Other: None. IMPRESSION: Normal unenhanced CT scan of the brain. Electronically Signed   By: Lajean Manes M.D.   On: 11/29/2017 15:48   Ct Abdomen Pelvis W Contrast  Result Date: 11/29/2017 CLINICAL DATA:  Nausea and vomiting for several days with abdominal pain EXAM: CT ABDOMEN AND PELVIS WITH CONTRAST TECHNIQUE: Multidetector CT imaging of the abdomen and pelvis was performed using  the standard protocol following bolus administration of intravenous contrast. CONTRAST:  139mL ISOVUE-300 IOPAMIDOL (ISOVUE-300) INJECTION 61% COMPARISON:  None. FINDINGS: Lower chest: Lung bases demonstrate left basilar atelectasis as well as some generalized increased density within the lungs consistent with the congestive changes seen on recent chest x-ray. Small pericardial effusion is noted. Cardiac enlargement is noted. Hepatobiliary: Liver is well visualized and within normal limits. The gallbladder is decompressed. Pancreas: Unremarkable. No pancreatic ductal dilatation or surrounding inflammatory changes. Spleen: Normal in size without focal abnormality. Adrenals/Urinary Tract: Adrenal glands are within normal limits. The kidneys demonstrate a normal enhancement pattern without evidence of calculi or obstructive change. The bladder is well distended. Stomach/Bowel: No colonic abnormality is seen. The appendix is within normal limits. Multiple mildly dilated loops of small bowel are identified in the left mid abdomen and across the midline without obstructive change. Significant venous engorgement of the small bowel is noted. These changes are consistent with small bowel inflammation. No obstructive changes are noted. Vascular/Lymphatic: No significant vascular findings are present. No  enlarged abdominal or pelvic lymph nodes. Reproductive: Uterus and bilateral adnexa are unremarkable. Other: Free fluid is noted within the pelvis likely reactive to the inflammatory change in the small bowel. No free air is seen. Musculoskeletal: No acute bony abnormality is noted. IMPRESSION: Multiple mildly dilated loops of small bowel with thickened edematous walls consistent with underlying small bowel inflammation. No obstructive changes are seen. These changes may be related to the patient's given clinical history of lupus. Mild free fluid within the pelvis likely reactive in nature. No findings to suggest perforation are seen. Changes in the lung bases with left basilar atelectasis and changes of congestive failure. Small pericardial effusion. Electronically Signed   By: Inez Catalina M.D.   On: 11/29/2017 15:50        Scheduled Meds: . amLODipine  10 mg Oral Daily  . heparin  5,000 Units Subcutaneous Q8H  . hydrochlorothiazide  25 mg Oral Daily  . methylPREDNISolone (SOLU-MEDROL) injection  60 mg Intravenous Daily  . ondansetron (ZOFRAN) IV  4 mg Intravenous Once   Continuous Infusions: . sodium chloride 75 mL/hr at 11/29/17 2100  . levofloxacin (LEVAQUIN) IV Stopped (11/29/17 2155)  . metronidazole 500 mg (11/30/17 1219)  . niCARDipine 7.5 mg/hr (11/30/17 1031)     LOS: 1 day    Time spent:35 mins. More than 50% of that time was spent in counseling and/or coordination of care.      Shelly Coss, MD [facility] Pager [phone]  If 7PM-7AM, please contact night-coverage [email or web address] Password [facility] 11/30/2017, 12:39 PM

## 2017-11-30 NOTE — Consult Note (Addendum)
Reason for Consult:abnormal CT abdominal pain Referring Physician: Hospital team  Linda [name] is an 22 y.o. female.  HPI: patient seen and examined and her hospital computer chart reviewed and she denies any previous GI issues particularly not like this and she's not had any previous GI surgeries and her family history is negative from a GI standpoint and she has no sick contacts at home and she says her nausea vomiting abdominal pain and diarrhea started on discharge from [facility] recently when she was treated for cellulitis and her diarrhea tends to be at night and she does say the pain medicines help the pain but she has no other complaints  Past Medical History:  Diagnosis Date  . Lupus ([place])   . Migraines   . Mixed connective tissue disease [facility])     Past Surgical History:  Procedure Laterality Date  . I&D EXTREMITY Right 08/06/2017   Procedure: IRRIGATION AND DEBRIDEMENT KNEE;  Surgeon: Shona Needles, MD;  Location: [place];  Service: Orthopedics;  Laterality: Right;  . MULTIPLE TOOTH EXTRACTIONS      Family History  Problem Relation Age of Onset  . Lupus Mother   . Hypertension Mother   . Kidney disease Mother   . Diabetes Maternal Grandmother     Social History:  reports that she has been smoking cigarettes and cigars.  She started smoking about 2 years ago. She has been smoking about 0.00 packs per day. She has never used smokeless tobacco. She reports that she does not drink alcohol or use drugs.  Allergies:  Allergies  Allergen Reactions  . Vicodin [Hydrocodone-Acetaminophen] Itching and Rash    Medications: I have reviewed the patient's current medications.  Results for orders placed or performed during the hospital encounter of 11/29/17 (from the past 48 hour(s))  Urinalysis, Routine w reflex microscopic     Status: None   Collection Time: 11/29/17  1:20 PM  Result Value Ref Range   Color, Urine YELLOW YELLOW   APPearance CLEAR CLEAR   Specific  Gravity, Urine 1.008 1.005 - 1.030   pH 7.0 5.0 - 8.0   Glucose, UA NEGATIVE NEGATIVE mg/dL   Hgb urine dipstick NEGATIVE NEGATIVE   Bilirubin Urine NEGATIVE NEGATIVE   Ketones, ur NEGATIVE NEGATIVE mg/dL   Protein, ur NEGATIVE NEGATIVE mg/dL   Nitrite NEGATIVE NEGATIVE   Leukocytes, UA NEGATIVE NEGATIVE    Comment: Performed at [facility] [address], [place], [place] [address]  Lipase, blood     Status: None   Collection Time: 11/29/17  1:24 PM  Result Value Ref Range   Lipase 24 11 - 51 U/L    Comment: Performed at [facility], [place] [address], [place], [place] [address]  Comprehensive metabolic panel     Status: Abnormal   Collection Time: 11/29/17  1:24 PM  Result Value Ref Range   Sodium 138 135 - 145 mmol/L   Potassium 3.7 3.5 - 5.1 mmol/L   Chloride 99 (L) 101 - 111 mmol/L   CO2 21 (L) 22 - 32 mmol/L   Glucose, Bld 95 65 - 99 mg/dL   BUN 13 6 - 20 mg/dL   Creatinine, Ser 1.04 (H) 0.44 - 1.00 mg/dL   Calcium 9.6 8.9 - 10.3 mg/dL   Total Protein 8.9 (H) 6.5 - 8.1 g/dL   Albumin 4.0 3.5 - 5.0 g/dL   AST 35 15 - 41 U/L   ALT 20 14 - 54 U/L  Alkaline Phosphatase 86 38 - 126 U/L   Total Bilirubin 1.6 (H) 0.3 - 1.2 mg/dL   GFR calc non Af Amer >60 >60 mL/min   GFR calc Af Amer >60 >60 mL/min    Comment: (NOTE) The eGFR has been calculated using the CKD EPI equation. This calculation has not been validated in all clinical situations. eGFR's persistently <60 mL/min signify possible Chronic Kidney Disease.    Anion gap 18 (H) 5 - 15    Comment: Performed at [facility], [place] [address], [place], [place] [address]  CBC     Status: Abnormal   Collection Time: 11/29/17  1:24 PM  Result Value Ref Range   WBC 17.9 (H) 4.0 - 10.5 K/uL   RBC 4.31 3.87 - 5.11 MIL/uL   Hemoglobin 14.1 12.0 - 15.0 g/dL   HCT 41.9 36.0 - 46.0 %   MCV 97.2 78.0 - 100.0 fL   MCH 32.7 26.0 - 34.0 pg   MCHC 33.7 30.0 - 36.0 g/dL   RDW  15.5 11.5 - 15.5 %   Platelets 233 150 - 400 K/uL    Comment: Performed at [facility], [place] [address] [place], [place] [address]  Brain natriuretic peptide     Status: Abnormal   Collection Time: 11/29/17  1:24 PM  Result Value Ref Range   B Natriuretic Peptide 1,781.4 (H) 0.0 - 100.0 pg/mL    Comment: Performed at [facility], [place] [address] [place], [place] [address]  I-Stat beta hCG blood, ED     Status: None   Collection Time: 11/29/17  1:29 PM  Result Value Ref Range   I-stat hCG, quantitative <5.0 <5 mIU/mL   Comment 3            Comment:   GEST. AGE      CONC.  (mIU/mL)   <=1 WEEK        5 - 50     2 WEEKS       50 - 500     3 WEEKS       100 - 10,000     4 WEEKS     1,000 - 30,000        FEMALE AND NON-PREGNANT FEMALE:     LESS THAN 5 mIU/mL   I-Stat Troponin, ED (not at [facility])     Status: None   Collection Time: 11/29/17  2:56 PM  Result Value Ref Range   Troponin i, poc 0.00 0.00 - 0.08 ng/mL   Comment 3            Comment: Due to the release kinetics of cTnI, a negative result within the first hours of the onset of symptoms does not rule out myocardial infarction with certainty. If myocardial infarction is still suspected, repeat the test at appropriate intervals.   CBC     Status: Abnormal   Collection Time: 11/29/17  7:27 PM  Result Value Ref Range   WBC 17.3 (H) 4.0 - 10.5 K/uL   RBC 4.16 3.87 - 5.11 MIL/uL   Hemoglobin 13.2 12.0 - 15.0 g/dL   HCT 41.1 36.0 - 46.0 %   MCV 98.8 78.0 - 100.0 fL   MCH 31.7 26.0 - 34.0 pg   MCHC 32.1 30.0 - 36.0 g/dL   RDW 15.8 (H) 11.5 - 15.5 %   Platelets 257 150 - 400 K/uL    Comment: Performed at [facility], [place] [address] [place],  [place] [address]  Creatinine, serum     Status: None   Collection Time: 11/29/17  7:27 PM  Result Value Ref Range   Creatinine, Ser 0.87 0.44 - 1.00 mg/dL   GFR calc non Af Amer >60 >60 mL/min   GFR calc Af Amer >60 >60 mL/min     Comment: (NOTE) The eGFR has been calculated using the CKD EPI equation. This calculation has not been validated in all clinical situations. eGFR's persistently <60 mL/min signify possible Chronic Kidney Disease. Performed at [facility], [place] [address], [place], [place] [address]   C difficile quick scan w PCR reflex     Status: None   Collection Time: 11/30/17  3:25 AM  Result Value Ref Range   C Diff antigen NEGATIVE NEGATIVE   C Diff toxin NEGATIVE NEGATIVE   C Diff interpretation No C. difficile detected.     Comment: Performed at [facility], [place] [address] [place] [address], [place], [place] [address]  Comprehensive metabolic panel     Status: Abnormal   Collection Time: 11/30/17  3:39 AM  Result Value Ref Range   Sodium 137 135 - 145 mmol/L   Potassium 5.1 3.5 - 5.1 mmol/L    Comment: MODERATE HEMOLYSIS DELTA CHECK NOTED    Chloride 102 101 - 111 mmol/L   CO2 22 22 - 32 mmol/L   Glucose, Bld 112 (H) 65 - 99 mg/dL   BUN 11 6 - 20 mg/dL   Creatinine, Ser 1.01 (H) 0.44 - 1.00 mg/dL   Calcium 8.6 (L) 8.9 - 10.3 mg/dL   Total Protein 8.0 6.5 - 8.1 g/dL   Albumin 3.5 3.5 - 5.0 g/dL   AST 50 (H) 15 - 41 U/L   ALT 22 14 - 54 U/L   Alkaline Phosphatase 77 38 - 126 U/L   Total Bilirubin 1.4 (H) 0.3 - 1.2 mg/dL   GFR calc non Af Amer >60 >60 mL/min   GFR calc Af Amer >60 >60 mL/min    Comment: (NOTE) The eGFR has been calculated using the CKD EPI equation. This calculation has not been validated in all clinical situations. eGFR's persistently <60 mL/min signify possible Chronic Kidney Disease.    Anion gap 13 5 - 15    Comment: Performed at [facility], [place] [address], [place],  [address]    Dg Chest 2 View  Result Date: 11/29/2017 CLINICAL DATA:  Short of breath EXAM: CHEST - 2 VIEW COMPARISON:  04/09/2017 FINDINGS: Severe cardiac enlargement with progression from the prior study. Congestive heart failure with mild edema.  No effusion or focal consolidation. IMPRESSION: Severe cardiac enlargement with congestive heart failure and mild edema. Electronically Signed   By: Franchot Gallo M.D.   On: 11/29/2017 15:27   Ct Head Wo Contrast  Result Date: 11/29/2017 CLINICAL DATA:  Per EMS pt ongoing n/v and lupus since discharge. Unable to take blood pressure medications related to suchPt. C/o headache EXAM: CT HEAD WITHOUT CONTRAST TECHNIQUE: Contiguous axial images were obtained from the base of the skull through the vertex without intravenous contrast. COMPARISON:  09/14/2017 FINDINGS: Brain: No evidence of acute infarction, hemorrhage, hydrocephalus, extra-axial collection or mass lesion/mass effect. Vascular: No hyperdense vessel or unexpected calcification. Skull: Normal. Negative for fracture or focal lesion. Sinuses/Orbits: Globes and orbits are unremarkable. Visualized sinuses and mastoid air cells are clear. Other: None. IMPRESSION: Normal unenhanced CT scan of the brain. Electronically Signed   By: Lajean Manes M.D.   On:  11/29/2017 15:48   Ct Abdomen Pelvis W Contrast  Result Date: 11/29/2017 CLINICAL DATA:  Nausea and vomiting for several days with abdominal pain EXAM: CT ABDOMEN AND PELVIS WITH CONTRAST TECHNIQUE: Multidetector CT imaging of the abdomen and pelvis was performed using the standard protocol following bolus administration of intravenous contrast. CONTRAST:  139m ISOVUE-300 IOPAMIDOL (ISOVUE-300) INJECTION 61% COMPARISON:  None. FINDINGS: Lower chest: Lung bases demonstrate left basilar atelectasis as well as some generalized increased density within the lungs consistent with the congestive changes seen on recent chest x-ray. Small pericardial effusion is noted. Cardiac enlargement is noted. Hepatobiliary: Liver is well visualized and within normal limits. The gallbladder is decompressed. Pancreas: Unremarkable. No pancreatic ductal dilatation or surrounding inflammatory changes. Spleen: Normal in size  without focal abnormality. Adrenals/Urinary Tract: Adrenal glands are within normal limits. The kidneys demonstrate a normal enhancement pattern without evidence of calculi or obstructive change. The bladder is well distended. Stomach/Bowel: No colonic abnormality is seen. The appendix is within normal limits. Multiple mildly dilated loops of small bowel are identified in the left mid abdomen and across the midline without obstructive change. Significant venous engorgement of the small bowel is noted. These changes are consistent with small bowel inflammation. No obstructive changes are noted. Vascular/Lymphatic: No significant vascular findings are present. No enlarged abdominal or pelvic lymph nodes. Reproductive: Uterus and bilateral adnexa are unremarkable. Other: Free fluid is noted within the pelvis likely reactive to the inflammatory change in the small bowel. No free air is seen. Musculoskeletal: No acute bony abnormality is noted. IMPRESSION: Multiple mildly dilated loops of small bowel with thickened edematous walls consistent with underlying small bowel inflammation. No obstructive changes are seen. These changes may be related to the patient's given clinical history of lupus. Mild free fluid within the pelvis likely reactive in nature. No findings to suggest perforation are seen. Changes in the lung bases with left basilar atelectasis and changes of congestive failure. Small pericardial effusion. Electronically Signed   By: MInez CatalinaM.D.   On: 11/29/2017 15:50    ROSnegative except above Blood pressure (!) 158/106, pulse 90, temperature 98.1 F (36.7 C), temperature source Oral, resp. rate 14, height '5\' 6"'$  (1.676 m), weight 92.1 kg (203 lb 0.7 oz), last menstrual period 11/19/2017, SpO2 98 %. Physical Exampatient lying comfortably in the bedno acute distress abdomen is soft rare bowel soundsmostly tender in the left lower quadrant no guarding or rebound white count 17 which is actually a  decrease from her previous hospital stay CT reviewed C. Difficile negative await pathogen panel  Assessment/Plan: Abdominal pain nausea vomiting diarrhea abnormal CT scan Plan: Might need rheumatologic service to see to see if she needs workup for vasculitis or abdominal ischemia or even stress dose of steroids otherwise will follow clinically and see how she is doing tomorrow and follow lab work and symptoms and decide how to proceed  [facility] 11/30/2017, 12:29 PM

## 2017-12-01 ENCOUNTER — Telehealth: Payer: Self-pay | Admitting: Internal Medicine

## 2017-12-01 LAB — GASTROINTESTINAL PANEL BY PCR, STOOL (REPLACES STOOL CULTURE)

## 2017-12-01 LAB — CBC WITH DIFFERENTIAL/PLATELET
Basophils Absolute: 0 10*3/uL (ref 0.0–0.1)
Basophils Relative: 0 %
Eosinophils Absolute: 0 10*3/uL (ref 0.0–0.7)
Eosinophils Relative: 0 %
HCT: 38.3 % (ref 36.0–46.0)
Hemoglobin: 12.3 g/dL (ref 12.0–15.0)
Lymphocytes Relative: 12 %
Lymphs Abs: 2.3 10*3/uL (ref 0.7–4.0)
MCH: 31.5 pg (ref 26.0–34.0)
MCHC: 32.1 g/dL (ref 30.0–36.0)
MCV: 98 fL (ref 78.0–100.0)
Monocytes Absolute: 0.8 10*3/uL (ref 0.1–1.0)
Monocytes Relative: 4 %
Neutro Abs: 16.4 10*3/uL — ABNORMAL HIGH (ref 1.7–7.7)
Neutrophils Relative %: 84 %
Platelets: 226 10*3/uL (ref 150–400)
RBC: 3.91 MIL/uL (ref 3.87–5.11)
RDW: 15.3 % (ref 11.5–15.5)
WBC: 19.6 10*3/uL — ABNORMAL HIGH (ref 4.0–10.5)

## 2017-12-01 LAB — BASIC METABOLIC PANEL
Anion gap: 11 (ref 5–15)
BUN: 10 mg/dL (ref 6–20)
CO2: 25 mmol/L (ref 22–32)
Calcium: 8.9 mg/dL (ref 8.9–10.3)
Chloride: 100 mmol/L — ABNORMAL LOW (ref 101–111)
Creatinine, Ser: 0.96 mg/dL (ref 0.44–1.00)
GFR calc Af Amer: >60 mL/min (ref >60)
GFR calc non Af Amer: >60 mL/min (ref >60)
Glucose, Bld: 101 mg/dL — ABNORMAL HIGH (ref 65–99)
Potassium: 3.6 mmol/L (ref 3.5–5.1)
Sodium: 136 mmol/L (ref 135–145)

## 2017-12-01 LAB — SEDIMENTATION RATE: Sed Rate: 31 mm/hr — ABNORMAL HIGH (ref 0–22)

## 2017-12-01 MED ORDER — ENALAPRILAT 1.25 MG/ML IV SOLN
1.2500 mg | Freq: Once | INTRAVENOUS | Status: AC
  Administered 2017-12-01: 1.25 mg via INTRAVENOUS
  Filled 2017-12-01: qty 1

## 2017-12-01 MED ORDER — HYDRALAZINE HCL 25 MG PO TABS
25.0000 mg | ORAL_TABLET | Freq: Three times a day (TID) | ORAL | Status: DC
  Administered 2017-12-01 – 2017-12-02 (×3): 25 mg via ORAL
  Filled 2017-12-01 (×3): qty 1

## 2017-12-01 NOTE — Progress Notes (Signed)
PROGRESS NOTE    Linda [name]  PFX:[id number] DOB: 28-Jun-1996 DOA: 11/29/2017 PCP: Tonette Bihari, MD   Brief Narrative:  Linda [name] is a 22 y.o. female with medical history significant of SLE, mixed connective tissue disorder, hypertension, migraine who presented to the emergency department today with complaints of nausea, vomiting, abdominal pain, diarrhea.  Patient was just discharged on 11/25/17 after treatment of cellulitis and infrapatellar bursitis of the left knee.  Patient was discharged on oral antibiotics   which she finished at home. Patient started having nausea, vomiting, abdomen pain and diarrhea for 4 days.  She was complaining of severe abdominal pain.  She reported having multiple bowel movements today which were loose and watery.  Patient noted to have severe tenderness of the abdomen.   Patient has history of SLE and mixed connective tissue disorder and follows with rheumatology as an outpatient.  She is on prednisone 5 mg daily at home. CT abdomen showed:Multiple mildly dilated loops of small bowel with thickened edematous walls consistent with underlying small bowel inflammation.GI following.  Assessment & Plan:   Principal Problem:   Hypertensive urgency Active Problems:   Mixed connective tissue disease ([place])   Primary hypertension   Lupus erythematosus   Abdominal pain   Enteritis   Nausea vomiting and diarrhea   Leucocytosis   Hypertensive urgency:Much improved. Patient has history of hypertension secondary to lupus.  Blood pressure found to be in the range of 200s/100s on presentation.  Started on nicardipine drip which has been stopped now. We will continue to monitor blood pressure.She is on amlodipine and hydrochlorothiazide at home.  Added hydralazine.She has history of uncontrolled hypertension.  Enteritis: Presented with nausea, vomiting, abdominal pain and diarrhea. These symptoms have improved. CT abdomen/pelvis shows multiple mildly  dilated bowel loops with thickened edematous wall.  Etiology unknown at this point.  Could also be from SLE. Started on empiric antibiotics: Levaquin and Flagyl for possible infectious enteritis for now. GI pathogen panel and  C. Difficile negative. GI following. The above CT findings could also be associated with SLE so she was started on stress dose  of steroids. She has been tolerating clear liquid diet.  Will advance the diet to full liquid.  Nausea, vomiting and diarrhea:   Continue gentle IV fluids.  Continue antiemetics.These symptoms have improved.  History of SLE/mixed connective tissue disease: Follows with rheumatology as an outpatient.  On prednisone 5 mg daily at home.  Also on Plaquenil at home.Started on Solumedrol here.  Cardiomegaly: Severe cardiomegaly seen on the chest x-ray.  Might also be from hypertensive cardiomyopathy.    Her lungs were clear during my evaluation.  Echocardiogram showed left ventricle hypertrophy,  normal ejection fraction, no diastolic dysfunction .  Left knee cellulitis: Looks like resolved.  She was admitted with left knee swelling and was just discharged on 11/25/17.  MRI of the left knee showed infrapatellar bursitis.  Orthopedics was consulted at that time.  She was discharged on oral antibiotics which she finished the course.  She also has history of prepatellar septic bursitis of the right knee for which she had undergone I&D procedure in the past .She will follow-up with rheumatology and orthopedics as an outpatient.  Chronic pain syndrome: History of chronic pain .  She has chronic bilateral knee . Continue pain medication as necessary.  Leukocytosis: Looks to have improved since last admission.  She is on steroids at home and here also which may be contributing to leukocytosis.  DVT prophylaxis:  Heparin [place] Code Status: Full Family Communication: None present at the bedside Disposition Plan: Home in 1-2 days   Consultants:  Gastroenterology  Procedures: None  Antimicrobials: Levaquin and Flagyl since 4 /1/19  Subjective: Patient seen and examined the bedside this morning.  Appears comfortable today.  Abdomen pain has much improved.  Nausea vomiting have improved.  No diarrhea.  Blood pressure better controlled.  Objective: Vitals:   12/01/17 0945 12/01/17 1000 12/01/17 1015 12/01/17 1056  BP: (!) 146/103 (!) 142/107 (!) 141/100 (!) 151/107  Pulse:    75  Resp: 16 (!) 21 13   Temp:    (!) 97.5 F (36.4 C)  TempSrc:    Oral  SpO2: 100% 96% 96%   Weight:      Height:        Intake/Output Summary (Last 24 hours) at 12/01/2017 1312 Last data filed at 12/01/2017 1000 Gross per 24 hour  Intake 3885.83 ml  Output 3000 ml  Net 885.83 ml   Filed Weights   11/29/17 2315 11/30/17 0400 12/01/17 0334  Weight: 92.2 kg (203 lb 4.2 oz) 92.1 kg (203 lb 0.7 oz) 95 kg (209 lb 7 oz)    Examination:  General exam: Appears calm and comfortable ,Not in distress,obese HEENT:PERRL,Oral mucosa moist, Ear/Nose normal on gross exam Respiratory system: Bilateral equal air entry, normal vesicular breath sounds, no wheezes or crackles  Cardiovascular system: S1 & S2 heard, RRR. No JVD, murmurs, rubs, gallops or clicks. Gastrointestinal system: Abdomen is nondistended, soft and nontender. No organomegaly or masses felt. Normal bowel sounds heard. Central nervous system: Alert and oriented. No focal neurological deficits. Extremities:Trace edema, no clubbing ,no cyanosis, distal peripheral pulses palpable. Skin: No rashes, lesions or ulcers,no icterus ,no pallor MSK: Normal muscle bulk,tone ,power Psychiatry: Judgement and insight appear normal. Mood & affect appropriate.       Data Reviewed: I have personally reviewed following labs and imaging studies  CBC: Recent Labs  Lab 11/29/17 1324 11/29/17 1927 12/01/17 0313  WBC 17.9* 17.3* 19.6*  NEUTROABS  --   --  16.4*  HGB 14.1 13.2 12.3  HCT 41.9 41.1 38.3  MCV  97.2 98.8 98.0  PLT 233 257 462   Basic Metabolic Panel: Recent Labs  Lab 11/29/17 1324 11/29/17 1927 11/30/17 0339 12/01/17 0313  NA 138  --  137 136  K 3.7  --  5.1 3.6  CL 99*  --  102 100*  CO2 21*  --  22 25  GLUCOSE 95  --  112* 101*  BUN 13  --  11 10  CREATININE 1.04* 0.87 1.01* 0.96  CALCIUM 9.6  --  8.6* 8.9   GFR: Estimated Creatinine Clearance: 106.8 mL/min (by C-G formula based on SCr of 0.96 mg/dL). Liver Function Tests: Recent Labs  Lab 11/29/17 1324 11/30/17 0339  AST 35 50*  ALT 20 22  ALKPHOS 86 77  BILITOT 1.6* 1.4*  PROT 8.9* 8.0  ALBUMIN 4.0 3.5   Recent Labs  Lab 11/29/17 1324  LIPASE 24   No results for input(s): AMMONIA in the last 168 hours. Coagulation Profile: No results for input(s): INR, PROTIME in the last 168 hours. Cardiac Enzymes: No results for input(s): CKTOTAL, CKMB, CKMBINDEX, TROPONINI in the last 168 hours. BNP (last 3 results) No results for input(s): PROBNP in the last 8760 hours. HbA1C: No results for input(s): HGBA1C in the last 72 hours. CBG: No results for input(s): GLUCAP in the last 168 hours. Lipid  Profile: No results for input(s): CHOL, HDL, LDLCALC, TRIG, CHOLHDL, LDLDIRECT in the last 72 hours. Thyroid Function Tests: No results for input(s): TSH, T4TOTAL, FREET4, T3FREE, THYROIDAB in the last 72 hours. Anemia Panel: No results for input(s): VITAMINB12, FOLATE, FERRITIN, TIBC, IRON, RETICCTPCT in the last 72 hours. Sepsis Labs: No results for input(s): PROCALCITON, LATICACIDVEN in the last 168 hours.  Recent Results (from the past 240 hour(s))  Blood Culture (routine x 2)     Status: None   Collection Time: 11/22/17  2:33 AM  Result Value Ref Range Status   Specimen Description   Final    RIGHT ANTECUBITAL Performed at [facility] [address], [place], [place] [address]    Special Requests   Final    BOTTLES DRAWN AEROBIC AND ANAEROBIC Blood Culture results may not be optimal  due to an inadequate volume of blood received in culture bottles Performed at [facility] [address] [place], [place] [address]    Culture   Final    NO GROWTH 5 DAYS Performed at [facility], [place] [address] [place], [place] [address]    Report Status 11/27/2017 FINAL  Final  MRSA PCR Screening     Status: None   Collection Time: 11/23/17 10:31 AM  Result Value Ref Range Status   MRSA by PCR NEGATIVE NEGATIVE Final    Comment:        The GeneXpert MRSA Assay (FDA approved for NASAL specimens only), is one component of a comprehensive MRSA colonization surveillance program. It is not intended to diagnose MRSA infection nor to guide or monitor treatment for MRSA infections. Performed at [facility], [place] [address], [place], [place] [address]   C difficile quick scan w PCR reflex     Status: None   Collection Time: 11/30/17  3:25 AM  Result Value Ref Range Status   C Diff antigen NEGATIVE NEGATIVE Final   C Diff toxin NEGATIVE NEGATIVE Final   C Diff interpretation No C. difficile detected.  Final    Comment: Performed at [facility], [place] [address], [place], [place] [address]  Gastrointestinal Panel by PCR , Stool     Status: None   Collection Time: 11/30/17  3:25 AM  Result Value Ref Range Status   Campylobacter species NOT DETECTED NOT DETECTED Final   Plesimonas shigelloides NOT DETECTED NOT DETECTED Final   Salmonella species NOT DETECTED NOT DETECTED Final   Yersinia enterocolitica NOT DETECTED NOT DETECTED Final   Vibrio species NOT DETECTED NOT DETECTED Final   Vibrio cholerae NOT DETECTED NOT DETECTED Final   Enteroaggregative E coli (EAEC) NOT DETECTED NOT DETECTED Final   Enteropathogenic E coli (EPEC) NOT DETECTED NOT DETECTED Final   Enterotoxigenic E coli (ETEC) NOT DETECTED NOT DETECTED Final   Shiga like toxin producing E coli (STEC) NOT DETECTED NOT DETECTED Final    Shigella/Enteroinvasive E coli (EIEC) NOT DETECTED NOT DETECTED Final   Cryptosporidium NOT DETECTED NOT DETECTED Final   Cyclospora cayetanensis NOT DETECTED NOT DETECTED Final   Entamoeba histolytica NOT DETECTED NOT DETECTED Final   Giardia lamblia NOT DETECTED NOT DETECTED Final   Adenovirus F40/41 NOT DETECTED NOT DETECTED Final   Astrovirus NOT DETECTED NOT DETECTED Final   Norovirus GI/GII NOT DETECTED NOT DETECTED Final   Rotavirus A NOT DETECTED NOT DETECTED Final   Sapovirus (I, II, IV, and V) NOT DETECTED NOT DETECTED Final    Comment: Performed at  [facility], [address] [place],  [address]         Radiology Studies: Dg Chest 2 View  Result Date: 11/29/2017 CLINICAL DATA:  Short of breath EXAM: CHEST - 2 VIEW COMPARISON:  04/09/2017 FINDINGS: Severe cardiac enlargement with progression from the prior study. Congestive heart failure with mild edema. No effusion or focal consolidation. IMPRESSION: Severe cardiac enlargement with congestive heart failure and mild edema. Electronically Signed   By: Franchot Gallo M.D.   On: 11/29/2017 15:27   Ct Head Wo Contrast  Result Date: 11/29/2017 CLINICAL DATA:  Per EMS pt ongoing n/v and lupus since discharge. Unable to take blood pressure medications related to suchPt. C/o headache EXAM: CT HEAD WITHOUT CONTRAST TECHNIQUE: Contiguous axial images were obtained from the base of the skull through the vertex without intravenous contrast. COMPARISON:  09/14/2017 FINDINGS: Brain: No evidence of acute infarction, hemorrhage, hydrocephalus, extra-axial collection or mass lesion/mass effect. Vascular: No hyperdense vessel or unexpected calcification. Skull: Normal. Negative for fracture or focal lesion. Sinuses/Orbits: Globes and orbits are unremarkable. Visualized sinuses and mastoid air cells are clear. Other: None. IMPRESSION: Normal unenhanced CT scan of the brain. Electronically Signed   By: Lajean Manes M.D.   On:  11/29/2017 15:48   Ct Abdomen Pelvis W Contrast  Result Date: 11/29/2017 CLINICAL DATA:  Nausea and vomiting for several days with abdominal pain EXAM: CT ABDOMEN AND PELVIS WITH CONTRAST TECHNIQUE: Multidetector CT imaging of the abdomen and pelvis was performed using the standard protocol following bolus administration of intravenous contrast. CONTRAST:  139mL ISOVUE-300 IOPAMIDOL (ISOVUE-300) INJECTION 61% COMPARISON:  None. FINDINGS: Lower chest: Lung bases demonstrate left basilar atelectasis as well as some generalized increased density within the lungs consistent with the congestive changes seen on recent chest x-ray. Small pericardial effusion is noted. Cardiac enlargement is noted. Hepatobiliary: Liver is well visualized and within normal limits. The gallbladder is decompressed. Pancreas: Unremarkable. No pancreatic ductal dilatation or surrounding inflammatory changes. Spleen: Normal in size without focal abnormality. Adrenals/Urinary Tract: Adrenal glands are within normal limits. The kidneys demonstrate a normal enhancement pattern without evidence of calculi or obstructive change. The bladder is well distended. Stomach/Bowel: No colonic abnormality is seen. The appendix is within normal limits. Multiple mildly dilated loops of small bowel are identified in the left mid abdomen and across the midline without obstructive change. Significant venous engorgement of the small bowel is noted. These changes are consistent with small bowel inflammation. No obstructive changes are noted. Vascular/Lymphatic: No significant vascular findings are present. No enlarged abdominal or pelvic lymph nodes. Reproductive: Uterus and bilateral adnexa are unremarkable. Other: Free fluid is noted within the pelvis likely reactive to the inflammatory change in the small bowel. No free air is seen. Musculoskeletal: No acute bony abnormality is noted. IMPRESSION: Multiple mildly dilated loops of small bowel with thickened  edematous walls consistent with underlying small bowel inflammation. No obstructive changes are seen. These changes may be related to the patient's given clinical history of lupus. Mild free fluid within the pelvis likely reactive in nature. No findings to suggest perforation are seen. Changes in the lung bases with left basilar atelectasis and changes of congestive failure. Small pericardial effusion. Electronically Signed   By: Inez Catalina M.D.   On: 11/29/2017 15:50        Scheduled Meds: . amLODipine  10 mg Oral Daily  . heparin  5,000 Units Subcutaneous Q8H  . hydrochlorothiazide  25 mg Oral Daily  . methylPREDNISolone (SOLU-MEDROL)  injection  60 mg Intravenous Daily  . ondansetron (ZOFRAN) IV  4 mg Intravenous Once   Continuous Infusions: . sodium chloride 75 mL/hr at 12/01/17 0235  . levofloxacin (LEVAQUIN) IV Stopped (11/30/17 2225)  . metronidazole Stopped (12/01/17 0520)  . niCARDipine Stopped (12/01/17 0835)     LOS: 2 days    Time spent:25 mins. More than 50% of that time was spent in counseling and/or coordination of care.      Shelly Coss, MD [facility] Pager [phone]  If 7PM-7AM, please contact night-coverage [email or web address] Password [email or web address] 12/01/2017, 1:12 PM

## 2017-12-01 NOTE — Progress Notes (Signed)
Linda [name] 1:28 PM  Subjective: Patient doing better today without any nausea vomiting or diarrhea and her abdominal pain is better but she does not like to clear liquids and likes her new room better and has no new complaints  Objective: ital signs stable afebrile abdomen is soft a little sore no guarding or rebound rare bowel sounds white count increased but on increased doses steroids chemistries okay sedimentation rate not too high  Assessment: improved  Plan: Agree with slowly advancing diet would ask rheumatology to see and will check on tomorrow  [facility]  Pager [phone] After 5PM or if no answer call [phone]

## 2017-12-01 NOTE — Telephone Encounter (Signed)
Pt mother called to let Dr Emmaline Life know that Linda [name] is admitted to [facility] due to a Lupus flare up. She also wants someone to contact her to answer some questions she has concerning past appointments.

## 2017-12-02 LAB — BASIC METABOLIC PANEL
Anion gap: 11 (ref 5–15)
BUN: 15 mg/dL (ref 6–20)
CO2: 27 mmol/L (ref 22–32)
Calcium: 9.1 mg/dL (ref 8.9–10.3)
Chloride: 97 mmol/L — ABNORMAL LOW (ref 101–111)
Creatinine, Ser: 1.09 mg/dL — ABNORMAL HIGH (ref 0.44–1.00)
GFR calc Af Amer: >60 mL/min (ref >60)
GFR calc non Af Amer: >60 mL/min (ref >60)
Glucose, Bld: 106 mg/dL — ABNORMAL HIGH (ref 65–99)
Potassium: 3.6 mmol/L (ref 3.5–5.1)
Sodium: 135 mmol/L (ref 135–145)

## 2017-12-02 LAB — CBC WITH DIFFERENTIAL/PLATELET
Basophils Absolute: 0 10*3/uL (ref 0.0–0.1)
Basophils Relative: 0 %
Eosinophils Absolute: 0 10*3/uL (ref 0.0–0.7)
Eosinophils Relative: 0 %
HCT: 36.9 % (ref 36.0–46.0)
Hemoglobin: 11.9 g/dL — ABNORMAL LOW (ref 12.0–15.0)
Lymphocytes Relative: 12 %
Lymphs Abs: 2.4 10*3/uL (ref 0.7–4.0)
MCH: 31.4 pg (ref 26.0–34.0)
MCHC: 32.2 g/dL (ref 30.0–36.0)
MCV: 97.4 fL (ref 78.0–100.0)
Monocytes Absolute: 1.8 10*3/uL — ABNORMAL HIGH (ref 0.1–1.0)
Monocytes Relative: 9 %
Neutro Abs: 15.5 10*3/uL — ABNORMAL HIGH (ref 1.7–7.7)
Neutrophils Relative %: 79 %
Platelets: 210 10*3/uL (ref 150–400)
RBC: 3.79 MIL/uL — ABNORMAL LOW (ref 3.87–5.11)
RDW: 15.5 % (ref 11.5–15.5)
WBC: 19.6 10*3/uL — ABNORMAL HIGH (ref 4.0–10.5)

## 2017-12-02 MED ORDER — PREDNISONE 20 MG PO TABS
40.0000 mg | ORAL_TABLET | Freq: Every day | ORAL | Status: DC
  Administered 2017-12-03: 40 mg via ORAL
  Filled 2017-12-02: qty 2

## 2017-12-02 MED ORDER — LABETALOL HCL 5 MG/ML IV SOLN
10.0000 mg | Freq: Once | INTRAVENOUS | Status: AC
  Administered 2017-12-02: 10 mg via INTRAVENOUS
  Filled 2017-12-02: qty 4

## 2017-12-02 MED ORDER — DICYCLOMINE HCL 10 MG PO CAPS
10.0000 mg | ORAL_CAPSULE | Freq: Three times a day (TID) | ORAL | Status: DC
  Administered 2017-12-02 – 2017-12-04 (×7): 10 mg via ORAL
  Filled 2017-12-02 (×8): qty 1

## 2017-12-02 MED ORDER — HYDROMORPHONE HCL 2 MG PO TABS
1.0000 mg | ORAL_TABLET | Freq: Once | ORAL | Status: AC
  Administered 2017-12-02: 1 mg via ORAL
  Filled 2017-12-02: qty 1

## 2017-12-02 MED ORDER — LISINOPRIL 10 MG PO TABS
10.0000 mg | ORAL_TABLET | Freq: Every day | ORAL | Status: DC
  Administered 2017-12-02: 10 mg via ORAL
  Filled 2017-12-02: qty 1

## 2017-12-02 MED ORDER — LISINOPRIL 10 MG PO TABS
10.0000 mg | ORAL_TABLET | Freq: Once | ORAL | Status: AC
  Administered 2017-12-02: 10 mg via ORAL
  Filled 2017-12-02: qty 1

## 2017-12-02 MED ORDER — HYDROCHLOROTHIAZIDE 25 MG PO TABS
50.0000 mg | ORAL_TABLET | Freq: Every day | ORAL | Status: DC
  Administered 2017-12-02 – 2017-12-04 (×3): 50 mg via ORAL
  Filled 2017-12-02 (×3): qty 2

## 2017-12-02 MED ORDER — LISINOPRIL 20 MG PO TABS
20.0000 mg | ORAL_TABLET | Freq: Every day | ORAL | Status: DC
  Administered 2017-12-03 – 2017-12-04 (×2): 20 mg via ORAL
  Filled 2017-12-02 (×2): qty 1

## 2017-12-02 MED ORDER — GI COCKTAIL ~~LOC~~
30.0000 mL | Freq: Three times a day (TID) | ORAL | Status: DC | PRN
  Administered 2017-12-02: 30 mL via ORAL
  Filled 2017-12-02: qty 30

## 2017-12-02 MED ORDER — LISINOPRIL 5 MG PO TABS: 5.0000 mg | ORAL_TABLET | Freq: Every day | ORAL | Status: DC

## 2017-12-02 NOTE — Progress Notes (Signed)
Pt having increased abdominal pain after eating, level 8-10 pain, sharp/stabbing pain. Radiated to back at times. Medicated as ordered. SRP, RN

## 2017-12-02 NOTE — Progress Notes (Signed)
PROGRESS NOTE    [name]  NUU:[id number] DOB: [birth year] DOA: 11/29/2017 PCP: Tonette Bihari, MD   Brief Narrative:  Linda [name] is a 22 y.o. female with medical history significant of SLE, mixed connective tissue disorder, hypertension, migraine who presented to the emergency department today with complaints of nausea, vomiting, abdominal pain, diarrhea.  Patient was just discharged on 11/25/17 after treatment of cellulitis and infrapatellar bursitis of the left knee.  Patient was discharged on oral antibiotics   which she finished at home. Patient started having nausea, vomiting, abdomen pain and diarrhea for 4 days.  She was complaining of severe abdominal pain.  She reported having multiple bowel movements today which were loose and watery.  Patient noted to have severe tenderness of the abdomen.   Patient has history of SLE and mixed connective tissue disorder and follows with rheumatology as an outpatient.  She is on prednisone 5 mg daily at home. CT abdomen showed:Multiple mildly dilated loops of small bowel with thickened edematous walls consistent with underlying small bowel inflammation.GI following.  Assessment & Plan:   Principal Problem:   Hypertensive urgency Active Problems:   Mixed connective tissue disease ([place])   Primary hypertension   Lupus erythematosus   Abdominal pain   Enteritis   Nausea vomiting and diarrhea   Leucocytosis   Hypertensive urgency:Much improved. Patient has history of hypertension secondary to lupus.  Blood pressure found to be in the range of 200s/100s on presentation.  Started on nicardipine drip which has been stopped now. We will continue to monitor blood pressure.She is on amlodipine and hydrochlorothiazide at home.  Added lisinopril.Dose of HCTZ increased.Fluids stopped.She has history of uncontrolled hypertension.  Enteritis: Presented with nausea, vomiting, abdominal pain and diarrhea. These symptoms have improved. CT  abdomen/pelvis shows multiple mildly dilated bowel loops with thickened edematous wall.  Etiology unknown at this point.  Could also be from SLE. Started on empiric antibiotics: Levaquin and Flagyl for possible infectious enteritis for now. GI pathogen panel and  C. Difficile negative. GI following. The above CT findings could also be associated with SLE so she was started on stress dose  of steroids. Diet advanced.  Nausea, vomiting and diarrhea:     Continue antiemetics as needed.These symptoms have improved.  History of SLE/mixed connective tissue disease: Follows with rheumatology as an outpatient.  On prednisone 5 mg daily at home.  Also on Plaquenil at home.Started on prednisone here. Once she is ready, she will be discharged on tapering dose of prednisone.  She will follow-up with her rhematologist as an outpatient.  Cardiomegaly: Severe cardiomegaly seen on the chest x-ray.  Might also be from hypertensive cardiomyopathy.    Her lungs areclear during my evaluation.  Echocardiogram showed left ventricle hypertrophy,  normal ejection fraction, no diastolic dysfunction .  Left knee cellulitis: Looks like resolved.  She was admitted with left knee swelling and was just discharged on 11/25/17.  MRI of the left knee showed infrapatellar bursitis.  Orthopedics was consulted at that time.  She was discharged on oral antibiotics which she finished the course.  She also has history of prepatellar septic bursitis of the right knee for which she had undergone I&D procedure in the past .She will follow-up with rheumatology and orthopedics as an outpatient.  Chronic pain syndrome: History of chronic pain .  She has chronic bilateral knee . Continue pain medication as necessary.  Leukocytosis: Looks to have improved since last admission.  She is on steroids at  home and here also which may be contributing to leukocytosis.     DVT prophylaxis:  Heparin [place] Code Status: Full Family Communication:  Mother called on cell phone on 12/01/17 Disposition Plan: Home likely tomorrow   Consultants: Gastroenterology  Procedures: None  Antimicrobials: Levaquin and Flagyl since 4 /1/19  Subjective: Patient seen and examined the bedside this morning.  Abdominal symptoms have improved.  No complaints of diarrhea, nausea or vomiting.  Blood pressure still high.  Adjusted medications.  We will monitor her 1 more day.  Objective: Vitals:   12/02/17 0526 12/02/17 0812 12/02/17 0813 12/02/17 1136  BP: (!) 137/111 (!) 157/121 (!) 159/120 (!) 160/115  Pulse: 69 60 (!) 58 67  Resp: 18  16   Temp: 97.8 F (36.6 C)  98.5 F (36.9 C)   TempSrc: Oral  Oral   SpO2: 100%  99%   Weight:      Height:        Intake/Output Summary (Last 24 hours) at 12/02/2017 1207 Last data filed at 12/01/2017 2200 Gross per 24 hour  Intake 1140 ml  Output -  Net 1140 ml   Filed Weights   11/29/17 2315 11/30/17 0400 12/01/17 0334  Weight: 92.2 kg (203 lb 4.2 oz) 92.1 kg (203 lb 0.7 oz) 95 kg (209 lb 7 oz)    Examination:  General exam: Appears calm and comfortable ,Not in distress,obese HEENT:PERRL,Oral mucosa moist, Ear/Nose normal on gross exam Respiratory system: Bilateral equal air entry, normal vesicular breath sounds, no wheezes or crackles  Cardiovascular system: S1 & S2 heard, RRR. No JVD, murmurs, rubs, gallops or clicks. Gastrointestinal system: Abdomen is nondistended, soft and nontender. No organomegaly or masses felt. Normal bowel sounds heard. Central nervous system: Alert and oriented. No focal neurological deficits. Extremities: No edema, no clubbing ,no cyanosis, distal peripheral pulses palpable. Skin: multiple healing macular papular rash most likely secondary to SLE, lesions or ulcers,no icterus ,no pallor MSK: Normal muscle bulk,tone ,power Psychiatry: Judgement and insight appear normal. Mood & affect appropriate.     Data Reviewed: I have personally reviewed following labs and imaging  studies  CBC: Recent Labs  Lab 11/29/17 1324 11/29/17 1927 12/01/17 0313 12/02/17 0433  WBC 17.9* 17.3* 19.6* 19.6*  NEUTROABS  --   --  16.4* 15.5*  HGB 14.1 13.2 12.3 11.9*  HCT 41.9 41.1 38.3 36.9  MCV 97.2 98.8 98.0 97.4  PLT 233 257 226 254   Basic Metabolic Panel: Recent Labs  Lab 11/29/17 1324 11/29/17 1927 11/30/17 0339 12/01/17 0313 12/02/17 0433  NA 138  --  137 136 135  K 3.7  --  5.1 3.6 3.6  CL 99*  --  102 100* 97*  CO2 21*  --  22 25 27   GLUCOSE 95  --  112* 101* 106*  BUN 13  --  11 10 15   CREATININE 1.04* 0.87 1.01* 0.96 1.09*  CALCIUM 9.6  --  8.6* 8.9 9.1   GFR: Estimated Creatinine Clearance: 94.1 mL/min (A) (by C-G formula based on SCr of 1.09 mg/dL (H)). Liver Function Tests: Recent Labs  Lab 11/29/17 1324 11/30/17 0339  AST 35 50*  ALT 20 22  ALKPHOS 86 77  BILITOT 1.6* 1.4*  PROT 8.9* 8.0  ALBUMIN 4.0 3.5   Recent Labs  Lab 11/29/17 1324  LIPASE 24   No results for input(s): AMMONIA in the last 168 hours. Coagulation Profile: No results for input(s): INR, PROTIME in the last 168 hours. Cardiac Enzymes: No  results for input(s): CKTOTAL, CKMB, CKMBINDEX, TROPONINI in the last 168 hours. BNP (last 3 results) No results for input(s): PROBNP in the last 8760 hours. HbA1C: No results for input(s): HGBA1C in the last 72 hours. CBG: No results for input(s): GLUCAP in the last 168 hours. Lipid Profile: No results for input(s): CHOL, HDL, LDLCALC, TRIG, CHOLHDL, LDLDIRECT in the last 72 hours. Thyroid Function Tests: No results for input(s): TSH, T4TOTAL, FREET4, T3FREE, THYROIDAB in the last 72 hours. Anemia Panel: No results for input(s): VITAMINB12, FOLATE, FERRITIN, TIBC, IRON, RETICCTPCT in the last 72 hours. Sepsis Labs: No results for input(s): PROCALCITON, LATICACIDVEN in the last 168 hours.  Recent Results (from the past 240 hour(s))  MRSA PCR Screening     Status: None   Collection Time: 11/23/17 10:31 AM  Result Value  Ref Range Status   MRSA by PCR NEGATIVE NEGATIVE Final    Comment:        The GeneXpert MRSA Assay (FDA approved for NASAL specimens only), is one component of a comprehensive MRSA colonization surveillance program. It is not intended to diagnose MRSA infection nor to guide or monitor treatment for MRSA infections. Performed at [facility], [place] [address], [place], [place] [address]   C difficile quick scan w PCR reflex     Status: None   Collection Time: 11/30/17  3:25 AM  Result Value Ref Range Status   C Diff antigen NEGATIVE NEGATIVE Final   C Diff toxin NEGATIVE NEGATIVE Final   C Diff interpretation No C. difficile detected.  Final    Comment: Performed at [facility], [place] [address], [place], [place] [address]  Gastrointestinal Panel by PCR , Stool     Status: None   Collection Time: 11/30/17  3:25 AM  Result Value Ref Range Status   Campylobacter species NOT DETECTED NOT DETECTED Final   Plesimonas shigelloides NOT DETECTED NOT DETECTED Final   Salmonella species NOT DETECTED NOT DETECTED Final   Yersinia enterocolitica NOT DETECTED NOT DETECTED Final   Vibrio species NOT DETECTED NOT DETECTED Final   Vibrio cholerae NOT DETECTED NOT DETECTED Final   Enteroaggregative E coli (EAEC) NOT DETECTED NOT DETECTED Final   Enteropathogenic E coli (EPEC) NOT DETECTED NOT DETECTED Final   Enterotoxigenic E coli (ETEC) NOT DETECTED NOT DETECTED Final   Shiga like toxin producing E coli (STEC) NOT DETECTED NOT DETECTED Final   Shigella/Enteroinvasive E coli (EIEC) NOT DETECTED NOT DETECTED Final   Cryptosporidium NOT DETECTED NOT DETECTED Final   Cyclospora cayetanensis NOT DETECTED NOT DETECTED Final   Entamoeba histolytica NOT DETECTED NOT DETECTED Final   Giardia lamblia NOT DETECTED NOT DETECTED Final   Adenovirus F40/41 NOT DETECTED NOT DETECTED Final   Astrovirus NOT DETECTED NOT DETECTED Final   Norovirus GI/GII NOT DETECTED NOT  DETECTED Final   Rotavirus A NOT DETECTED NOT DETECTED Final   Sapovirus (I, II, IV, and V) NOT DETECTED NOT DETECTED Final    Comment: Performed at [facility], [address] [place], [place] [address]         Radiology Studies: No results found.      Scheduled Meds: . amLODipine  10 mg Oral Daily  . heparin  5,000 Units Subcutaneous Q8H  . hydrochlorothiazide  50 mg Oral Daily  . labetalol  10 mg Intravenous Once  . lisinopril  10 mg Oral Once  . [START ON 12/03/2017] lisinopril  20 mg Oral Daily  . ondansetron (ZOFRAN) IV  4 mg Intravenous Once  . [START ON 12/03/2017] predniSONE  40 mg Oral Q breakfast   Continuous Infusions: . levofloxacin (LEVAQUIN) IV Stopped (12/01/17 2210)  . metronidazole Stopped (12/02/17 0432)     LOS: 3 days    Time spent:25 mins. More than 50% of that time was spent in counseling and/or coordination of care.      Shelly Coss, MD [facility] Pager [phone]  If 7PM-7AM, please contact night-coverage [email or web address] Password [email or web address] 12/02/2017, 12:07 PM

## 2017-12-02 NOTE — Progress Notes (Signed)
Linda [name] 6:30 PM  Subjective: Unfortunately patient is not as good as yesterdayand eating solid food seemed to increase her abdominal pain but no nausea vomiting or diarrhea and no new complaints and her cellulitis is better does have some back pain as well  Objective: Vital signs stable afebrile no acute distress abdomen so little tender ore upper than lower no guarding or rebound occasional bowel sounds white count unchanged chemistries okay  Assessment: Enteritis questionable from her lupus  Plan: would get rheumatology involved or let her follow up quickly with them as an outpatient and will ask my partner to check on tomorrow to help decide if any further workup and plans needs to be done  [facility] E  Pager [phone] After 5PM or if no answer call [phone]

## 2017-12-02 NOTE — Progress Notes (Signed)
Follow up BP from this am...157/121 and 159/121 pt resting in bed without complaints or symptoms. MD updated. SRP,RN

## 2017-12-03 LAB — CBC WITH DIFFERENTIAL/PLATELET
Basophils Absolute: 0 10*3/uL (ref 0.0–0.1)
Basophils Relative: 0 %
Eosinophils Absolute: 0 10*3/uL (ref 0.0–0.7)
Eosinophils Relative: 0 %
HCT: 43.7 % (ref 36.0–46.0)
Hemoglobin: 14.3 g/dL (ref 12.0–15.0)
Lymphocytes Relative: 15 %
Lymphs Abs: 2.4 10*3/uL (ref 0.7–4.0)
MCH: 31.4 pg (ref 26.0–34.0)
MCHC: 32.7 g/dL (ref 30.0–36.0)
MCV: 95.8 fL (ref 78.0–100.0)
Monocytes Absolute: 2 10*3/uL (ref 0.1–1.0)
Monocytes Relative: 12 %
Neutro Abs: 12 10*3/uL (ref 1.7–7.7)
Neutrophils Relative %: 73 %
Platelets: 170 10*3/uL (ref 150–400)
RBC: 4.56 MIL/uL (ref 3.87–5.11)
RDW: 15.1 % (ref 11.5–15.5)
WBC: 16.3 10*3/uL — ABNORMAL HIGH (ref 4.0–10.5)

## 2017-12-03 MED ORDER — ZOLPIDEM TARTRATE 5 MG PO TABS
5.0000 mg | ORAL_TABLET | Freq: Once | ORAL | Status: AC
  Administered 2017-12-04: 5 mg via ORAL
  Filled 2017-12-03: qty 1

## 2017-12-03 MED ORDER — METHYLPREDNISOLONE SODIUM SUCC 40 MG IJ SOLR
40.0000 mg | Freq: Two times a day (BID) | INTRAMUSCULAR | Status: DC
  Administered 2017-12-03 – 2017-12-04 (×3): 40 mg via INTRAVENOUS
  Filled 2017-12-03 (×3): qty 1

## 2017-12-03 MED ORDER — HYDRALAZINE HCL 20 MG/ML IJ SOLN
10.0000 mg | INTRAMUSCULAR | Status: DC | PRN
  Administered 2017-12-03 – 2017-12-04 (×2): 10 mg via INTRAVENOUS
  Filled 2017-12-03 (×2): qty 1

## 2017-12-03 NOTE — Progress Notes (Signed)
The patient is still not feeling very well and is not really taking in food or liquids.  She reports a roughly 5-day history of abdominal pain, which began shortly after her last hospitalization for cellulitis (C. difficile negative).  I have reviewed the patient's recent CT scan with the radiologist; the small bowel wall thickening does appear to be "real" and is rather impressive and I think is most compatible with vasculitic enteritis.  On exam, the patient is lying quietly in bed in no distress.  She is cooperative and coherent.  The abdomen has bowel sounds and no significant tympany or distention, nor any significant abdominal tenderness.  Recommendation:   Continue to observe on steroids.    From review of her CT scan, it is unlikely that we would be able to reach the involved area of jejunum from above by use of a small bowel enteroscope, but if the patient's symptoms remain refractory, we could at least give that a try.    It is okay from the GI tract standpoint to advance the diet of the patient whenever she feels ready to do so, but I did advise her that frequent, small feedings would probably be better tolerated than eating a full meal at one time.  Cleotis Nipper, M.D. Pager [phone] If no answer or after 5 PM call [phone]

## 2017-12-03 NOTE — Progress Notes (Addendum)
PROGRESS NOTE    Linda [name]  ZDG:[id number] DOB: August 14, 1996 DOA: 11/29/2017 PCP: Tonette Bihari, MD   Brief Narrative:  Linda [name] is a 22 y.o. female with medical history significant of SLE, mixed connective tissue disorder, hypertension, migraine who presented to the emergency department with complaints of nausea, vomiting, abdominal pain, diarrhea.  Patient was just discharged on 11/25/17 after treatment of cellulitis and infrapatellar bursitis of the left knee.  Patient was discharged on oral antibiotics   which she finished at home. Patient started having nausea, vomiting, abdomen pain and diarrhea for 4 days.  She was complaining of severe abdominal pain.  She reported having multiple bowel movements  which were loose and watery.  Patient noted to have severe tenderness of the abdomen.   Patient has history of SLE(Discoid) and mixed connective tissue disorder and follows with rheumatology as an outpatient.  She is on prednisone 5 mg daily at home. CT abdomen showed:Multiple mildly dilated loops of small bowel with thickened edematous walls consistent with underlying small bowel inflammation.We are assuming that this is most likely Lupus enteritis. Patient's abdominal pain has improved on steroids.GI following.Plan is to observe her few more days until the abdominal pain is significantly better so that she can be discharged and follow up with rheumatology.  Assessment & Plan:   Principal Problem:   Hypertensive urgency Active Problems:   Mixed connective tissue disease ([place])   Primary hypertension   Lupus erythematosus   Abdominal pain   Enteritis   Nausea vomiting and diarrhea   Leucocytosis   Enteritis: Presented with nausea, vomiting, abdominal pain and diarrhea. These symptoms have improved.Abdominal pain better but still there.  No significant tenderness on abdominal examination today. CT abdomen/pelvis on presentation shows multiple mildly dilated bowel loops  with thickened edematous wall.  [place]  SLE enteritis. GI pathogen panel and  C. Difficile negative.Antibiotics stopped. GI following.Plan is small bowel enteroscopy if patient's abdominal pain persists. Continue IV steroids for now.  Once she is discharged, she can be sent on tapering dose of oral steroids.  She needs to follow-up with her own rheumatologist.She  Diet advanced to soft.  We have been recommending frequent, small volume meals.  Hypertensive urgency:Much improved. Patient has history of hypertension secondary to lupus.  Blood pressure found to be in the range of 200s/100s on presentation.  Started on nicardipine drip and she was admitted to SDU. Nicardipine stopped now. We will continue to monitor blood pressure.She is on amlodipine and hydrochlorothiazide at home.  Added lisinopril.Dose of HCTZ increased.Fluids stopped.She has history of uncontrolled hypertension.  She says at home her BP is around 150/90 mmHg.We need to titrate her medications by increasing the dose or adding new medication if her blood pressure continues to remain high.  Nausea, vomiting and diarrhea: Continue antiemetics as needed.These symptoms have resolved.  History of Discoid Lupus/mixed connective tissue disease: Follows with rheumatology as an outpatient.  On prednisone 5 mg daily at home.She was also supposed to be on Plaquenil 400 mg daily,but its not listed on her home meds. Once she is ready, she will be discharged on tapering dose of prednisone.  She will follow-up with her rheumatologist as an outpatient.She follows with Dr. Hermelinda Medicus .  I called her office today but she is in a conference in [place].  Cardiomegaly: Severe cardiomegaly seen on the chest x-ray.  Might also be from hypertensive cardiomyopathy.    Her lungs are clear during my evaluation.  Echocardiogram showed left  ventricle hypertrophy,  normal ejection fraction, no diastolic dysfunction .  Left knee cellulitis: Looks like  resolved.  She was admitted with left knee swelling and was just discharged on 11/25/17.  At that time,MRI of the left knee showed infrapatellar bursitis.  Orthopedics was consulted at that time.  She was discharged on oral antibiotics which she finished the course.  She also has history of prepatellar septic bursitis of the right knee for which she had undergone I&D procedure in the past .She will follow-up with  orthopedics as an outpatient.  Chronic pain syndrome: History of chronic pain .  She has chronic bilateral knee . Continue pain medication as necessary.She is on dilauid for abdominal pain.   Leukocytosis: Looks to have improved since last admission.  She is on steroids at home and here , which has been contributing for leukocytosis.     DVT prophylaxis:  Heparin [place] Code Status: Full Family Communication: None on the bed side today.Called mother on phone today Disposition Plan: Home after resolution of abdominal pain   Consultants: Gastroenterology  Procedures: None  Antimicrobials: Levaquin and Flagyl since 4 /1/19-12/03/17  Subjective: Patient seen and examined the bedside this morning.  Her abdominal pain was better from yesterday.  She still has some abdominal pain .No significant tenderness on examination. Patient felt better after she was given Bentyl and GI cocktail yesterday.  No nausea, vomiting or diarrhea.  Objective: Vitals:   12/02/17 1400 12/02/17 2046 12/03/17 0504 12/03/17 0830  BP: (!) 150/106 (!) 134/98 (!) 161/118 (!) 165/109  Pulse:  69 60 72  Resp:  12 16   Temp:  98.1 F (36.7 C) 98.8 F (37.1 C)   TempSrc:  Oral Oral   SpO2:  99% 100%   Weight:      Height:       No intake or output data in the 24 hours ending 12/03/17 1117 Filed Weights   11/29/17 2315 11/30/17 0400 12/01/17 0334  Weight: 92.2 kg (203 lb 4.2 oz) 92.1 kg (203 lb 0.7 oz) 95 kg (209 lb 7 oz)    Examination:  General exam: Appears calm and comfortable ,Not in  distress,obese HEENT:PERRL,Oral mucosa moist, Ear/Nose normal on gross exam Respiratory system: Bilateral equal air entry, normal vesicular breath sounds, no wheezes or crackles  Cardiovascular system: S1 & S2 heard, RRR. No JVD, murmurs, rubs, gallops or clicks. Gastrointestinal system: Abdomen is nondistended, soft and nontender. No organomegaly or masses felt. Normal bowel sounds heard. Central nervous system: Alert and oriented. No focal neurological deficits. Extremities: No edema, no clubbing ,no cyanosis, distal peripheral pulses palpable. Skin: Multiple healed maculopapular rash all over her body, likely SLE lesions,no icterus ,no pallor MSK: Normal muscle bulk,tone ,power Psychiatry: Judgement and insight appear normal. Mood & affect appropriate.       Data Reviewed: I have personally reviewed following labs and imaging studies  CBC: Recent Labs  Lab 11/29/17 1324 11/29/17 1927 12/01/17 0313 12/02/17 0433 12/03/17 0408  WBC 17.9* 17.3* 19.6* 19.6* 16.3*  NEUTROABS  --   --  16.4* 15.5* 12.0  HGB 14.1 13.2 12.3 11.9* 14.3  HCT 41.9 41.1 38.3 36.9 43.7  MCV 97.2 98.8 98.0 97.4 95.8  PLT 233 257 226 210 528   Basic Metabolic Panel: Recent Labs  Lab 11/29/17 1324 11/29/17 1927 11/30/17 0339 12/01/17 0313 12/02/17 0433  NA 138  --  137 136 135  K 3.7  --  5.1 3.6 3.6  CL 99*  --  102  100* 97*  CO2 21*  --  22 25 27   GLUCOSE 95  --  112* 101* 106*  BUN 13  --  11 10 15   CREATININE 1.04* 0.87 1.01* 0.96 1.09*  CALCIUM 9.6  --  8.6* 8.9 9.1   GFR: Estimated Creatinine Clearance: 94.1 mL/min (A) (by C-G formula based on SCr of 1.09 mg/dL (H)). Liver Function Tests: Recent Labs  Lab 11/29/17 1324 11/30/17 0339  AST 35 50*  ALT 20 22  ALKPHOS 86 77  BILITOT 1.6* 1.4*  PROT 8.9* 8.0  ALBUMIN 4.0 3.5   Recent Labs  Lab 11/29/17 1324  LIPASE 24   No results for input(s): AMMONIA in the last 168 hours. Coagulation Profile: No results for input(s): INR,  PROTIME in the last 168 hours. Cardiac Enzymes: No results for input(s): CKTOTAL, CKMB, CKMBINDEX, TROPONINI in the last 168 hours. BNP (last 3 results) No results for input(s): PROBNP in the last 8760 hours. HbA1C: No results for input(s): HGBA1C in the last 72 hours. CBG: No results for input(s): GLUCAP in the last 168 hours. Lipid Profile: No results for input(s): CHOL, HDL, LDLCALC, TRIG, CHOLHDL, LDLDIRECT in the last 72 hours. Thyroid Function Tests: No results for input(s): TSH, T4TOTAL, FREET4, T3FREE, THYROIDAB in the last 72 hours. Anemia Panel: No results for input(s): VITAMINB12, FOLATE, FERRITIN, TIBC, IRON, RETICCTPCT in the last 72 hours. Sepsis Labs: No results for input(s): PROCALCITON, LATICACIDVEN in the last 168 hours.  Recent Results (from the past 240 hour(s))  C difficile quick scan w PCR reflex     Status: None   Collection Time: 11/30/17  3:25 AM  Result Value Ref Range Status   C Diff antigen NEGATIVE NEGATIVE Final   C Diff toxin NEGATIVE NEGATIVE Final   C Diff interpretation No C. difficile detected.  Final    Comment: Performed at [facility], [place] [address] [place], [place] [address]  Gastrointestinal Panel by PCR , Stool     Status: None   Collection Time: 11/30/17  3:25 AM  Result Value Ref Range Status   Campylobacter species NOT DETECTED NOT DETECTED Final   Plesimonas shigelloides NOT DETECTED NOT DETECTED Final   Salmonella species NOT DETECTED NOT DETECTED Final   Yersinia enterocolitica NOT DETECTED NOT DETECTED Final   Vibrio species NOT DETECTED NOT DETECTED Final   Vibrio cholerae NOT DETECTED NOT DETECTED Final   Enteroaggregative E coli (EAEC) NOT DETECTED NOT DETECTED Final   Enteropathogenic E coli (EPEC) NOT DETECTED NOT DETECTED Final   Enterotoxigenic E coli (ETEC) NOT DETECTED NOT DETECTED Final   Shiga like toxin producing E coli (STEC) NOT DETECTED NOT DETECTED Final   Shigella/Enteroinvasive E coli (EIEC)  NOT DETECTED NOT DETECTED Final   Cryptosporidium NOT DETECTED NOT DETECTED Final   Cyclospora cayetanensis NOT DETECTED NOT DETECTED Final   Entamoeba histolytica NOT DETECTED NOT DETECTED Final   Giardia lamblia NOT DETECTED NOT DETECTED Final   Adenovirus F40/41 NOT DETECTED NOT DETECTED Final   Astrovirus NOT DETECTED NOT DETECTED Final   Norovirus GI/GII NOT DETECTED NOT DETECTED Final   Rotavirus A NOT DETECTED NOT DETECTED Final   Sapovirus (I, II, IV, and V) NOT DETECTED NOT DETECTED Final    Comment: Performed at [facility], [address], [place], [place] [address]         Radiology Studies: No results found.      Scheduled Meds: . amLODipine  10 mg Oral Daily  . dicyclomine  10 mg Oral TID AC & HS  . heparin  5,000 Units Subcutaneous Q8H  . hydrochlorothiazide  50 mg Oral Daily  . lisinopril  20 mg Oral Daily  . methylPREDNISolone (SOLU-MEDROL) injection  40 mg Intravenous Q12H  . ondansetron (ZOFRAN) IV  4 mg Intravenous Once   Continuous Infusions:    LOS: 4 days    Time spent:35 mins. More than 50% of that time was spent in counseling and/or coordination of care.      Shelly Coss, MD [facility] Pager [phone]  If 7PM-7AM, please contact night-coverage [email or web address] Password [email or web address] 12/03/2017, 11:17 AM

## 2017-12-03 NOTE — Progress Notes (Signed)
Pt states pain is getting better, level 6 able to eat 25 % lunch, then stopped when started to experience abd discomfort. Pt resting , ambulated in the halls, showered, denies n/v. BP rechecked---144/95 (108) 60. MD updated. SRP, RN

## 2017-12-04 LAB — BASIC METABOLIC PANEL
Anion gap: 13 (ref 5–15)
BUN: 23 mg/dL — ABNORMAL HIGH (ref 6–20)
CO2: 29 mmol/L (ref 22–32)
Calcium: 9.6 mg/dL (ref 8.9–10.3)
Chloride: 95 mmol/L — ABNORMAL LOW (ref 101–111)
Creatinine, Ser: 1.26 mg/dL — ABNORMAL HIGH (ref 0.44–1.00)
GFR calc Af Amer: >60 mL/min (ref >60)
GFR calc non Af Amer: 60 mL/min — ABNORMAL LOW (ref >60)
Glucose, Bld: 129 mg/dL — ABNORMAL HIGH (ref 65–99)
Potassium: 3.5 mmol/L (ref 3.5–5.1)
Sodium: 137 mmol/L (ref 135–145)

## 2017-12-04 LAB — CBC WITH DIFFERENTIAL/PLATELET
Basophils Absolute: 0 10*3/uL (ref 0.0–0.1)
Basophils Relative: 0 %
Eosinophils Absolute: 0 10*3/uL (ref 0.0–0.7)
Eosinophils Relative: 0 %
HCT: 43.8 % (ref 36.0–46.0)
Hemoglobin: 14.1 g/dL (ref 12.0–15.0)
Lymphocytes Relative: 12 %
Lymphs Abs: 2.4 10*3/uL (ref 0.7–4.0)
MCH: 31.8 pg (ref 26.0–34.0)
MCHC: 32.2 g/dL (ref 30.0–36.0)
MCV: 98.9 fL (ref 78.0–100.0)
Monocytes Absolute: 0.9 10*3/uL (ref 0.1–1.0)
Monocytes Relative: 4 %
Neutro Abs: 17.4 10*3/uL — ABNORMAL HIGH (ref 1.7–7.7)
Neutrophils Relative %: 84 %
Platelets: 232 10*3/uL (ref 150–400)
RBC: 4.43 MIL/uL (ref 3.87–5.11)
RDW: 15.4 % (ref 11.5–15.5)
WBC: 20.6 10*3/uL — ABNORMAL HIGH (ref 4.0–10.5)

## 2017-12-04 MED ORDER — DICYCLOMINE HCL 10 MG PO CAPS: 10.0000 mg | ORAL_CAPSULE | Freq: Three times a day (TID) | ORAL | 1 refills | Status: DC

## 2017-12-04 MED ORDER — PREDNISONE 20 MG PO TABS: 40.0000 mg | ORAL_TABLET | Freq: Every day | ORAL | 0 refills | Status: DC

## 2017-12-04 MED ORDER — OXYCODONE HCL 5 MG PO TABS: 5.0000 mg | ORAL_TABLET | Freq: Four times a day (QID) | ORAL | 0 refills | Status: DC | PRN

## 2017-12-04 MED ORDER — HYDROCHLOROTHIAZIDE 50 MG PO TABS: 50.0000 mg | ORAL_TABLET | Freq: Every day | ORAL | 1 refills | Status: DC

## 2017-12-04 MED ORDER — LISINOPRIL 20 MG PO TABS: 20.0000 mg | ORAL_TABLET | Freq: Every day | ORAL | 1 refills | Status: DC

## 2017-12-04 NOTE — Discharge Summary (Signed)
Physician Discharge Summary  Linda [name]:[id number] DOB: [birth year] DOA: 11/29/2017  PCP: Tonette Bihari, MD  Admit date: 11/29/2017 Discharge date: 12/04/2017  Admitted From:Home Disposition:  Home  Recommendations for Outpatient Follow-up:  1. Follow up with PCP in 1-2 weeks 2. Please obtain BMP/CBC in one week   Home Health:No Equipment/Devices:None  Discharge Condition: Stable CODE STATUS: FULL Diet recommendation: Heart Healthy  Brief/Interim Summary:  #) Presumed lupus enteritis: Patient was admitted with abdominal pain, nausea, vomiting and found to have CT evidence of significant amounts of small bowel wall thickening consistent with enteritis.  Stool studies were negative for C. difficile and infectious causes.  GI was consulted for possible evaluation was small bowel/push enteroscopy.  It was felt that most likely this was consistent with a vasculitic process from her lupus/mixed connective tissue disease.  She was started on IV steroids and discharged home on oral steroids 40 mg daily to be tapered by her rheumatologist as an outpatient.  #) Hypertensive urgency: Patient had episodes of a symptom medic hypertension to the 127N systolic.  She apparently has had significant hypertension at home to the 170 systolic.  Her UA showed no evidence of any abnormalities suggesting that either lupus nephritis or vasculitic involvement of her renal arteries was not present.  Suspect this is most likely related to her chronic steroid use and poor compliance with medications.  She was discharged home on HCTZ 50 mg, lisinopril 20 mg, amlodipine 10 mg.  #) Mixed connective tissue disease/lupus discoid: This remained relatively stable during this hospitalization.  Patient has outpatient follow-up.    Discharge Diagnoses:  Principal Problem:   Hypertensive urgency Active Problems:   Mixed connective tissue disease ([facility] [facility])   Primary hypertension   Lupus erythematosus   Abdominal  pain   Enteritis   Nausea vomiting and diarrhea   Leucocytosis    Discharge Instructions  Discharge Instructions    Call MD for:  persistant nausea and vomiting   Complete by:  As directed    Call MD for:  redness, tenderness, or signs of infection (pain, swelling, redness, odor or green/yellow discharge around incision site)   Complete by:  As directed    Call MD for:  severe uncontrolled pain   Complete by:  As directed    Call MD for:  temperature >100.4   Complete by:  As directed    Diet - low sodium heart healthy   Complete by:  As directed    Discharge instructions   Complete by:  As directed    Please follow-up with your rheumatologist to further taper his steroids.  Please follow-up with your primary care doctor so she can adjust her hypertensive medications.   Increase activity slowly   Complete by:  As directed      Allergies as of 12/04/2017      Reactions   Vicodin [hydrocodone-acetaminophen] Itching, Rash      Medication List    TAKE these medications   amLODipine 10 MG tablet Commonly known as:  NORVASC Take 1 tablet (10 mg total) by mouth daily.   dicyclomine 10 MG capsule Commonly known as:  BENTYL Take 1 capsule (10 mg total) by mouth 4 (four) times daily -  before meals and at bedtime.   hydrochlorothiazide 50 MG tablet Commonly known as:  HYDRODIURIL Take 1 tablet (50 mg total) by mouth daily. Start taking on:  12/05/2017 What changed:    medication strength  how much to take   lisinopril 20  MG tablet Commonly known as:  PRINIVIL,ZESTRIL Take 1 tablet (20 mg total) by mouth daily. Start taking on:  12/05/2017   oxyCODONE 5 MG immediate release tablet Commonly known as:  Oxy IR/ROXICODONE Take 1 tablet (5 mg total) by mouth every 6 (six) hours as needed for breakthrough pain.   predniSONE 20 MG tablet Commonly known as:  DELTASONE Take 2 tablets (40 mg total) by mouth daily. What changed:    medication strength  how much to  take  additional instructions   tiZANidine 2 MG tablet Commonly known as:  ZANAFLEX Take 1 tablet (2 mg total) by mouth every 8 (eight) hours as needed for muscle spasms.       Allergies  Allergen Reactions  . Vicodin [Hydrocodone-Acetaminophen] Itching and Rash    Consultations:  Gastroenterology [facility]   Procedures/Studies: Dg Chest 2 View  Result Date: 11/29/2017 CLINICAL DATA:  Short of breath EXAM: CHEST - 2 VIEW COMPARISON:  04/09/2017 FINDINGS: Severe cardiac enlargement with progression from the prior study. Congestive heart failure with mild edema. No effusion or focal consolidation. IMPRESSION: Severe cardiac enlargement with congestive heart failure and mild edema. Electronically Signed   By: Franchot Gallo M.D.   On: 11/29/2017 15:27   Ct Head Wo Contrast  Result Date: 11/29/2017 CLINICAL DATA:  Per EMS pt ongoing n/v and lupus since discharge. Unable to take blood pressure medications related to suchPt. C/o headache EXAM: CT HEAD WITHOUT CONTRAST TECHNIQUE: Contiguous axial images were obtained from the base of the skull through the vertex without intravenous contrast. COMPARISON:  09/14/2017 FINDINGS: Brain: No evidence of acute infarction, hemorrhage, hydrocephalus, extra-axial collection or mass lesion/mass effect. Vascular: No hyperdense vessel or unexpected calcification. Skull: Normal. Negative for fracture or focal lesion. Sinuses/Orbits: Globes and orbits are unremarkable. Visualized sinuses and mastoid air cells are clear. Other: None. IMPRESSION: Normal unenhanced CT scan of the brain. Electronically Signed   By: Lajean Manes M.D.   On: 11/29/2017 15:48   Ct Abdomen Pelvis W Contrast  Result Date: 11/29/2017 CLINICAL DATA:  Nausea and vomiting for several days with abdominal pain EXAM: CT ABDOMEN AND PELVIS WITH CONTRAST TECHNIQUE: Multidetector CT imaging of the abdomen and pelvis was performed using the standard protocol following bolus administration of  intravenous contrast. CONTRAST:  124mL ISOVUE-300 IOPAMIDOL (ISOVUE-300) INJECTION 61% COMPARISON:  None. FINDINGS: Lower chest: Lung bases demonstrate left basilar atelectasis as well as some generalized increased density within the lungs consistent with the congestive changes seen on recent chest x-ray. Small pericardial effusion is noted. Cardiac enlargement is noted. Hepatobiliary: Liver is well visualized and within normal limits. The gallbladder is decompressed. Pancreas: Unremarkable. No pancreatic ductal dilatation or surrounding inflammatory changes. Spleen: Normal in size without focal abnormality. Adrenals/Urinary Tract: Adrenal glands are within normal limits. The kidneys demonstrate a normal enhancement pattern without evidence of calculi or obstructive change. The bladder is well distended. Stomach/Bowel: No colonic abnormality is seen. The appendix is within normal limits. Multiple mildly dilated loops of small bowel are identified in the left mid abdomen and across the midline without obstructive change. Significant venous engorgement of the small bowel is noted. These changes are consistent with small bowel inflammation. No obstructive changes are noted. Vascular/Lymphatic: No significant vascular findings are present. No enlarged abdominal or pelvic lymph nodes. Reproductive: Uterus and bilateral adnexa are unremarkable. Other: Free fluid is noted within the pelvis likely reactive to the inflammatory change in the small bowel. No free air is seen. Musculoskeletal: No acute bony abnormality  is noted. IMPRESSION: Multiple mildly dilated loops of small bowel with thickened edematous walls consistent with underlying small bowel inflammation. No obstructive changes are seen. These changes may be related to the patient's given clinical history of lupus. Mild free fluid within the pelvis likely reactive in nature. No findings to suggest perforation are seen. Changes in the lung bases with left basilar  atelectasis and changes of congestive failure. Small pericardial effusion. Electronically Signed   By: Inez Catalina M.D.   On: 11/29/2017 15:50   Mr Knee Left W Wo Contrast  Result Date: 11/22/2017 CLINICAL DATA:  Left knee pain and swelling for 3 days EXAM: MRI OF THE LEFT KNEE WITHOUT AND WITH CONTRAST TECHNIQUE: Multiplanar, multisequence MR imaging of the knee was performed before and after the administration of intravenous contrast. CONTRAST:  85mL MULTIHANCE GADOBENATE DIMEGLUMINE 529 MG/ML IV SOLN COMPARISON:  None. FINDINGS: MENISCI Medial meniscus:  Intact. Lateral meniscus:  Intact. LIGAMENTS Cruciates:  Intact ACL and PCL. Collaterals: Medial collateral ligament is intact. Lateral collateral ligament complex is intact. CARTILAGE Patellofemoral:  No chondral defect. Medial: Mild partial-thickness cartilage loss of the medial femorotibial compartment. Lateral:  No chondral defect. Joint: No joint effusion. Normal Hoffa's fat. No plical thickening. Small amount of fluid in the deep infrapatellar bursa. Popliteal Fossa:  No Baker's cyst. Intact popliteus tendon. Extensor Mechanism: Intact quadriceps tendon. Intact patellar tendon. Intact medial patellar retinaculum. Intact lateral patellar retinaculum. Intact MPFL. Bones: No focal marrow signal abnormality. No fracture or dislocation. Other: Soft tissue edema enhancement involving the anterior subcutaneous fat most concerning for cellulitis. No focal fluid collection within the subcutaneous fat to suggest abscess. Multiple soft tissue calcifications within the subcutaneous fat. IMPRESSION: 1. Cellulitis along the anterior aspect of the knee. Mild deep infrapatellar bursitis which may be secondary to an inflammatory infectious etiology. Electronically Signed   By: Kathreen Devoid   On: 11/22/2017 14:44   Dg Knee Complete 4 Views Left  Result Date: 11/22/2017 CLINICAL DATA:  Left knee pain and swelling for 3 days EXAM: LEFT KNEE - COMPLETE 4+ VIEW  COMPARISON:  Left knee radiograph 12/28/2016 FINDINGS: Extensive soft tissue calcification surrounding the knee, worsened from the prior study. The joint itself is normal. No fracture or dislocation. No knee effusion. IMPRESSION: 1. No fracture, dislocation or arthropathy of the left knee. 2. Extensive soft tissue calcification, advanced compared to 12/28/2016 and compatible with diagnosis of lupus. Electronically Signed   By: Ulyses Jarred M.D.   On: 11/22/2017 03:56   Dg Foot Complete Right  Result Date: 11/22/2017 CLINICAL DATA:  22 year old female with pain over the fifth metatarsal of the right foot. No injury. EXAM: RIGHT FOOT COMPLETE - 3+ VIEW COMPARISON:  None. FINDINGS: There is no acute fracture or dislocation. No arthritic changes. There is amorphous calcification of the soft tissues of the plantar aspect adjacent to the head of the fifth metatarsal, chronic and nonspecific but may be related to underlying connective tissue disease. Clinical correlation is recommended. IMPRESSION: 1. No acute osseous pathology. 2. Amorphous soft tissue calcification along the plantar aspect of the head of the fifth metatarsal. Clinical correlation is recommended. Electronically Signed   By: Anner Crete M.D.   On: 11/22/2017 03:55      Subjective:   Discharge Exam: Vitals:   12/03/17 2012 12/04/17 0629  BP: (!) 160/109 (!) 163/116  Pulse: (!) 59 66  Resp: 18 20  Temp: 97.9 F (36.6 C) 98.1 F (36.7 C)  SpO2: 100% 100%  Vitals:   12/03/17 1429 12/03/17 1658 12/03/17 2012 12/04/17 0629  BP: (!) 125/114 (!) 144/95 (!) 160/109 (!) 163/116  Pulse: 68 60 (!) 59 66  Resp:   18 20  Temp: 97.6 F (36.4 C)  97.9 F (36.6 C) 98.1 F (36.7 C)  TempSrc: Oral  Oral Oral  SpO2: 98%  100% 100%  Weight:      Height:        General: Pt is alert, awake, not in acute distress, discoid features noted over skin Cardiovascular: 2 out of 6 systolic murmur heard across precordium Respiratory: CTA  bilaterally, no wheezing, no rhonchi Abdominal: Soft, mildly tender to deep palpation, plus bowel sounds, no rebound or guarding Extremities: no edema    The results of significant diagnostics from this hospitalization (including imaging, microbiology, ancillary and laboratory) are listed below for reference.     Microbiology: Recent Results (from the past 240 hour(s))  C difficile quick scan w PCR reflex     Status: None   Collection Time: 11/30/17  3:25 AM  Result Value Ref Range Status   C Diff antigen NEGATIVE NEGATIVE Final   C Diff toxin NEGATIVE NEGATIVE Final   C Diff interpretation No C. difficile detected.  Final    Comment: Performed at [facility], [place] [address], [place], [place] [address]  Gastrointestinal Panel by PCR , Stool     Status: None   Collection Time: 11/30/17  3:25 AM  Result Value Ref Range Status   Campylobacter species NOT DETECTED NOT DETECTED Final   Plesimonas shigelloides NOT DETECTED NOT DETECTED Final   Salmonella species NOT DETECTED NOT DETECTED Final   Yersinia enterocolitica NOT DETECTED NOT DETECTED Final   Vibrio species NOT DETECTED NOT DETECTED Final   Vibrio cholerae NOT DETECTED NOT DETECTED Final   Enteroaggregative E coli (EAEC) NOT DETECTED NOT DETECTED Final   Enteropathogenic E coli (EPEC) NOT DETECTED NOT DETECTED Final   Enterotoxigenic E coli (ETEC) NOT DETECTED NOT DETECTED Final   Shiga like toxin producing E coli (STEC) NOT DETECTED NOT DETECTED Final   Shigella/Enteroinvasive E coli (EIEC) NOT DETECTED NOT DETECTED Final   Cryptosporidium NOT DETECTED NOT DETECTED Final   Cyclospora cayetanensis NOT DETECTED NOT DETECTED Final   Entamoeba histolytica NOT DETECTED NOT DETECTED Final   Giardia lamblia NOT DETECTED NOT DETECTED Final   Adenovirus F40/41 NOT DETECTED NOT DETECTED Final   Astrovirus NOT DETECTED NOT DETECTED Final   Norovirus GI/GII NOT DETECTED NOT DETECTED Final   Rotavirus A NOT DETECTED  NOT DETECTED Final   Sapovirus (I, II, IV, and V) NOT DETECTED NOT DETECTED Final    Comment: Performed at [facility], [address] [place], [place] [address]     Labs: BNP (last 3 results) Recent Labs    11/29/17 1324  BNP 2,536.6*   Basic Metabolic Panel: Recent Labs  Lab 11/29/17 1324 11/29/17 1927 11/30/17 0339 12/01/17 0313 12/02/17 0433 12/04/17 0402  NA 138  --  137 136 135 137  K 3.7  --  5.1 3.6 3.6 3.5  CL 99*  --  102 100* 97* 95*  CO2 21*  --  22 25 27 29   GLUCOSE 95  --  112* 101* 106* 129*  BUN 13  --  11 10 15  23*  CREATININE 1.04* 0.87 1.01* 0.96 1.09* 1.26*  CALCIUM 9.6  --  8.6* 8.9 9.1 9.6   Liver Function Tests: Recent Labs  Lab 11/29/17 1324 11/30/17  0339  AST 35 50*  ALT 20 22  ALKPHOS 86 77  BILITOT 1.6* 1.4*  PROT 8.9* 8.0  ALBUMIN 4.0 3.5   Recent Labs  Lab 11/29/17 1324  LIPASE 24   No results for input(s): AMMONIA in the last 168 hours. CBC: Recent Labs  Lab 11/29/17 1927 12/01/17 0313 12/02/17 0433 12/03/17 0408 12/04/17 0402  WBC 17.3* 19.6* 19.6* 16.3* 20.6*  NEUTROABS  --  16.4* 15.5* 12.0 17.4*  HGB 13.2 12.3 11.9* 14.3 14.1  HCT 41.1 38.3 36.9 43.7 43.8  MCV 98.8 98.0 97.4 95.8 98.9  PLT 257 226 210 170 232   Cardiac Enzymes: No results for input(s): CKTOTAL, CKMB, CKMBINDEX, TROPONINI in the last 168 hours. BNP: Invalid input(s): POCBNP CBG: No results for input(s): GLUCAP in the last 168 hours. D-Dimer No results for input(s): DDIMER in the last 72 hours. Hgb A1c No results for input(s): HGBA1C in the last 72 hours. Lipid Profile No results for input(s): CHOL, HDL, LDLCALC, TRIG, CHOLHDL, LDLDIRECT in the last 72 hours. Thyroid function studies No results for input(s): TSH, T4TOTAL, T3FREE, THYROIDAB in the last 72 hours.  Invalid input(s): FREET3 Anemia work up No results for input(s): VITAMINB12, FOLATE, FERRITIN, TIBC, IRON, RETICCTPCT in the last 72 hours. Urinalysis    Component  Value Date/Time   COLORURINE YELLOW 11/29/2017 1320   APPEARANCEUR CLEAR 11/29/2017 1320   LABSPEC 1.008 11/29/2017 1320   PHURINE 7.0 11/29/2017 1320   GLUCOSEU NEGATIVE 11/29/2017 1320   HGBUR NEGATIVE 11/29/2017 1320   BILIRUBINUR NEGATIVE 11/29/2017 1320   KETONESUR NEGATIVE 11/29/2017 1320   PROTEINUR NEGATIVE 11/29/2017 1320   UROBILINOGEN 1.0 03/30/2015 2328   NITRITE NEGATIVE 11/29/2017 1320   LEUKOCYTESUR NEGATIVE 11/29/2017 1320   Sepsis Labs Invalid input(s): PROCALCITONIN,  WBC,  LACTICIDVEN Microbiology Recent Results (from the past 240 hour(s))  C difficile quick scan w PCR reflex     Status: None   Collection Time: 11/30/17  3:25 AM  Result Value Ref Range Status   C Diff antigen NEGATIVE NEGATIVE Final   C Diff toxin NEGATIVE NEGATIVE Final   C Diff interpretation No C. difficile detected.  Final    Comment: Performed at [facility], [place] [address] [place], [place] [address]  Gastrointestinal Panel by PCR , Stool     Status: None   Collection Time: 11/30/17  3:25 AM  Result Value Ref Range Status   Campylobacter species NOT DETECTED NOT DETECTED Final   Plesimonas shigelloides NOT DETECTED NOT DETECTED Final   Salmonella species NOT DETECTED NOT DETECTED Final   Yersinia enterocolitica NOT DETECTED NOT DETECTED Final   Vibrio species NOT DETECTED NOT DETECTED Final   Vibrio cholerae NOT DETECTED NOT DETECTED Final   Enteroaggregative E coli (EAEC) NOT DETECTED NOT DETECTED Final   Enteropathogenic E coli (EPEC) NOT DETECTED NOT DETECTED Final   Enterotoxigenic E coli (ETEC) NOT DETECTED NOT DETECTED Final   Shiga like toxin producing E coli (STEC) NOT DETECTED NOT DETECTED Final   Shigella/Enteroinvasive E coli (EIEC) NOT DETECTED NOT DETECTED Final   Cryptosporidium NOT DETECTED NOT DETECTED Final   Cyclospora cayetanensis NOT DETECTED NOT DETECTED Final   Entamoeba histolytica NOT DETECTED NOT DETECTED Final   Giardia lamblia NOT  DETECTED NOT DETECTED Final   Adenovirus F40/41 NOT DETECTED NOT DETECTED Final   Astrovirus NOT DETECTED NOT DETECTED Final   Norovirus GI/GII NOT DETECTED NOT DETECTED Final   Rotavirus A NOT DETECTED NOT DETECTED Final  Sapovirus (I, II, IV, and V) NOT DETECTED NOT DETECTED Final    Comment: Performed at [facility], [address] [place], [place] [address]     Time coordinating discharge: Over 30 minutes  SIGNED:   Cristy Folks, MD  [facility] 12/04/2017, 11:07 AM   If 7PM-7AM, please contact night-coverage [email or web address] Password [email or web address]

## 2017-12-04 NOTE — Discharge Instructions (Signed)
Prednisone tablets °What is this medicine? °PREDNISONE (PRED ni sone) is a corticosteroid. It is commonly used to treat inflammation of the skin, joints, lungs, and other organs. Common conditions treated include asthma, allergies, and arthritis. It is also used for other conditions, such as blood disorders and diseases of the adrenal glands. °This medicine may be used for other purposes; ask your health care provider or pharmacist if you have questions. °COMMON BRAND NAME(S): Deltasone, Predone, Sterapred, Sterapred DS °What should I tell my health care provider before I take this medicine? °They need to know if you have any of these conditions: °-Cushing's syndrome °-diabetes °-glaucoma °-heart disease °-high blood pressure °-infection (especially a virus infection such as chickenpox, cold sores, or herpes) °-kidney disease °-liver disease °-mental illness °-myasthenia gravis °-osteoporosis °-seizures °-stomach or intestine problems °-thyroid disease °-an unusual or allergic reaction to lactose, prednisone, other medicines, foods, dyes, or preservatives °-pregnant or trying to get pregnant °-breast-feeding °How should I use this medicine? °Take this medicine by mouth with a glass of water. Follow the directions on the prescription label. Take this medicine with food. If you are taking this medicine once a day, take it in the morning. Do not take more medicine than you are told to take. Do not suddenly stop taking your medicine because you may develop a severe reaction. Your doctor will tell you how much medicine to take. If your doctor wants you to stop the medicine, the dose may be slowly lowered over time to avoid any side effects. °Talk to your pediatrician regarding the use of this medicine in children. Special care may be needed. °Overdosage: If you think you have taken too much of this medicine contact a poison control center or emergency room at once. °NOTE: This medicine is only for you. Do not share this  medicine with others. °What if I miss a dose? °If you miss a dose, take it as soon as you can. If it is almost time for your next dose, talk to your doctor or health care professional. You may need to miss a dose or take an extra dose. Do not take double or extra doses without advice. °What may interact with this medicine? °Do not take this medicine with any of the following medications: °-metyrapone °-mifepristone °This medicine may also interact with the following medications: °-aminoglutethimide °-amphotericin B °-aspirin and aspirin-like medicines °-barbiturates °-certain medicines for diabetes, like glipizide or glyburide °-cholestyramine °-cholinesterase inhibitors °-cyclosporine °-digoxin °-diuretics °-ephedrine °-female hormones, like estrogens and birth control pills °-isoniazid °-ketoconazole °-NSAIDS, medicines for pain and inflammation, like ibuprofen or naproxen °-phenytoin °-rifampin °-toxoids °-vaccines °-warfarin °This list may not describe all possible interactions. Give your health care provider a list of all the medicines, herbs, non-prescription drugs, or dietary supplements you use. Also tell them if you smoke, drink alcohol, or use illegal drugs. Some items may interact with your medicine. °What should I watch for while using this medicine? °Visit your doctor or health care professional for regular checks on your progress. If you are taking this medicine over a prolonged period, carry an identification card with your name and address, the type and dose of your medicine, and your doctor's name and address. °This medicine may increase your risk of getting an infection. Tell your doctor or health care professional if you are around anyone with measles or chickenpox, or if you develop sores or blisters that do not heal properly. °If you are going to have surgery, tell your doctor or health care professional that   you have taken this medicine within the last twelve months. °Ask your doctor or health  care professional about your diet. You may need to lower the amount of salt you eat. °This medicine may affect blood sugar levels. If you have diabetes, check with your doctor or health care professional before you change your diet or the dose of your diabetic medicine. °What side effects may I notice from receiving this medicine? °Side effects that you should report to your doctor or health care professional as soon as possible: °-allergic reactions like skin rash, itching or hives, swelling of the face, lips, or tongue °-changes in emotions or moods °-changes in vision °-depressed mood °-eye pain °-fever or chills, cough, sore throat, pain or difficulty passing urine °-increased thirst °-swelling of ankles, feet °Side effects that usually do not require medical attention (report to your doctor or health care professional if they continue or are bothersome): °-confusion, excitement, restlessness °-headache °-nausea, vomiting °-skin problems, acne, thin and shiny skin °-trouble sleeping °-weight gain °This list may not describe all possible side effects. Call your doctor for medical advice about side effects. You may report side effects to FDA at 1-800-FDA-1088. °Where should I keep my medicine? °Keep out of the reach of children. °Store at room temperature between 15 and 30 degrees C (59 and 86 degrees F). Protect from light. Keep container tightly closed. Throw away any unused medicine after the expiration date. °NOTE: This sheet is a summary. It may not cover all possible information. If you have questions about this medicine, talk to your doctor, pharmacist, or health care provider. °© 2018 [employer] (2011-04-02 10:57:14) ° °

## 2017-12-07 NOTE — Telephone Encounter (Signed)
Called and discussed with mother.

## 2017-12-24 ENCOUNTER — Encounter (HOSPITAL_COMMUNITY): Payer: Self-pay | Admitting: Emergency Medicine

## 2017-12-24 ENCOUNTER — Observation Stay (HOSPITAL_COMMUNITY)
Admission: EM | Admit: 2017-12-24 | Discharge: 2017-12-26 | Disposition: A | Discharge status: Home / Self Care | Payer: Medicaid Other | Attending: Internal Medicine | Admitting: Internal Medicine

## 2017-12-24 DIAGNOSIS — L03115 Cellulitis of right lower limb: Principal | ICD-10-CM | POA: Insufficient documentation

## 2017-12-24 DIAGNOSIS — D696 Thrombocytopenia, unspecified: Secondary | ICD-10-CM | POA: Diagnosis not present

## 2017-12-24 DIAGNOSIS — I509 Heart failure, unspecified: Secondary | ICD-10-CM | POA: Insufficient documentation

## 2017-12-24 DIAGNOSIS — I110 Hypertensive heart disease with heart failure: Secondary | ICD-10-CM | POA: Insufficient documentation

## 2017-12-24 DIAGNOSIS — I10 Essential (primary) hypertension: Secondary | ICD-10-CM | POA: Diagnosis present

## 2017-12-24 DIAGNOSIS — L0390 Cellulitis, unspecified: Secondary | ICD-10-CM | POA: Diagnosis present

## 2017-12-24 DIAGNOSIS — M25561 Pain in right knee: Secondary | ICD-10-CM | POA: Diagnosis present

## 2017-12-24 DIAGNOSIS — I160 Hypertensive urgency: Secondary | ICD-10-CM | POA: Insufficient documentation

## 2017-12-24 DIAGNOSIS — F17290 Nicotine dependence, other tobacco product, uncomplicated: Secondary | ICD-10-CM | POA: Insufficient documentation

## 2017-12-24 DIAGNOSIS — L930 Discoid lupus erythematosus: Secondary | ICD-10-CM | POA: Diagnosis not present

## 2017-12-24 DIAGNOSIS — F17210 Nicotine dependence, cigarettes, uncomplicated: Secondary | ICD-10-CM | POA: Insufficient documentation

## 2017-12-24 DIAGNOSIS — M329 Systemic lupus erythematosus, unspecified: Secondary | ICD-10-CM | POA: Diagnosis present

## 2017-12-24 LAB — URINALYSIS, ROUTINE W REFLEX MICROSCOPIC
Bilirubin Urine: NEGATIVE
Glucose, UA: NEGATIVE mg/dL
Ketones, ur: NEGATIVE mg/dL
Nitrite: NEGATIVE
Protein, ur: 100 mg/dL — AB
Specific Gravity, Urine: 1.015 (ref 1.005–1.030)
pH: 5 (ref 5.0–8.0)

## 2017-12-24 LAB — COMPREHENSIVE METABOLIC PANEL
ALT: 9 U/L — ABNORMAL LOW (ref 14–54)
AST: 16 U/L (ref 15–41)
Albumin: 3.6 g/dL (ref 3.5–5.0)
Alkaline Phosphatase: 62 U/L (ref 38–126)
Anion gap: 13 (ref 5–15)
BUN: 9 mg/dL (ref 6–20)
CO2: 20 mmol/L — ABNORMAL LOW (ref 22–32)
Calcium: 9.1 mg/dL (ref 8.9–10.3)
Chloride: 107 mmol/L (ref 101–111)
Creatinine, Ser: 1.26 mg/dL — ABNORMAL HIGH (ref 0.44–1.00)
GFR calc Af Amer: >60 mL/min (ref >60)
GFR calc non Af Amer: 60 mL/min — ABNORMAL LOW (ref >60)
Glucose, Bld: 75 mg/dL (ref 65–99)
Potassium: 3.5 mmol/L (ref 3.5–5.1)
Sodium: 140 mmol/L (ref 135–145)
Total Bilirubin: 1.4 mg/dL — ABNORMAL HIGH (ref 0.3–1.2)
Total Protein: 7.4 g/dL (ref 6.5–8.1)

## 2017-12-24 LAB — CBC WITH DIFFERENTIAL/PLATELET
Basophils Absolute: 0 10*3/uL (ref 0.0–0.1)
Basophils Relative: 0 %
Eosinophils Absolute: 0.1 10*3/uL (ref 0.0–0.7)
Eosinophils Relative: 1 %
HCT: 34.2 % — ABNORMAL LOW (ref 36.0–46.0)
Hemoglobin: 10.9 g/dL — ABNORMAL LOW (ref 12.0–15.0)
Lymphocytes Relative: 32 %
Lymphs Abs: 4.5 10*3/uL — ABNORMAL HIGH (ref 0.7–4.0)
MCH: 31.6 pg (ref 26.0–34.0)
MCHC: 31.9 g/dL (ref 30.0–36.0)
MCV: 99.1 fL (ref 78.0–100.0)
Monocytes Absolute: 1.3 10*3/uL — ABNORMAL HIGH (ref 0.1–1.0)
Monocytes Relative: 10 %
Neutro Abs: 7.9 10*3/uL — ABNORMAL HIGH (ref 1.7–7.7)
Neutrophils Relative %: 57 %
Platelets: 149 10*3/uL — ABNORMAL LOW (ref 150–400)
RBC: 3.45 MIL/uL — ABNORMAL LOW (ref 3.87–5.11)
RDW: 15.5 % (ref 11.5–15.5)
WBC: 13.8 10*3/uL — ABNORMAL HIGH (ref 4.0–10.5)

## 2017-12-24 LAB — I-STAT BETA HCG BLOOD, ED (MC, WL, AP ONLY): I-stat hCG, quantitative: <5 m[IU]/mL (ref <5)

## 2017-12-24 LAB — I-STAT CG4 LACTIC ACID, ED: Lactic Acid, Venous: 1.47 mmol/L (ref 0.5–1.9)

## 2017-12-24 MED ORDER — DICYCLOMINE HCL 10 MG PO CAPS
10.0000 mg | ORAL_CAPSULE | Freq: Three times a day (TID) | ORAL | Status: DC
Start: 2017-12-24 — End: 2017-12-26
  Administered 2017-12-24 – 2017-12-26 (×5): 10 mg via ORAL
  Filled 2017-12-24 (×5): qty 1

## 2017-12-24 MED ORDER — PREDNISONE 20 MG PO TABS
40.0000 mg | ORAL_TABLET | Freq: Every day | ORAL | Status: DC
  Administered 2017-12-24 – 2017-12-26 (×3): 40 mg via ORAL
  Filled 2017-12-24 (×3): qty 2

## 2017-12-24 MED ORDER — SODIUM CHLORIDE 0.9% FLUSH
3.0000 mL | Freq: Two times a day (BID) | INTRAVENOUS | Status: DC
  Administered 2017-12-25: 3 mL via INTRAVENOUS

## 2017-12-24 MED ORDER — LABETALOL HCL 5 MG/ML IV SOLN
10.0000 mg | Freq: Once | INTRAVENOUS | Status: AC
  Administered 2017-12-24: 10 mg via INTRAVENOUS
  Filled 2017-12-24: qty 4

## 2017-12-24 MED ORDER — LISINOPRIL 20 MG PO TABS
20.0000 mg | ORAL_TABLET | Freq: Every day | ORAL | Status: DC
  Administered 2017-12-24 – 2017-12-26 (×3): 20 mg via ORAL
  Filled 2017-12-24 (×3): qty 1

## 2017-12-24 MED ORDER — ACETAMINOPHEN 650 MG RE SUPP: 650.0000 mg | Freq: Four times a day (QID) | RECTAL | Status: DC | PRN

## 2017-12-24 MED ORDER — SODIUM CHLORIDE 0.9 % IV SOLN: 250.0000 mL | INTRAVENOUS | Status: DC | PRN

## 2017-12-24 MED ORDER — SODIUM CHLORIDE 0.9% FLUSH
3.0000 mL | INTRAVENOUS | Status: DC | PRN
  Administered 2017-12-24 – 2017-12-26 (×2): 3 mL via INTRAVENOUS
  Filled 2017-12-24 (×2): qty 3

## 2017-12-24 MED ORDER — VANCOMYCIN HCL IN DEXTROSE 1-5 GM/200ML-% IV SOLN: 1000.0000 mg | Freq: Once | INTRAVENOUS | Status: DC

## 2017-12-24 MED ORDER — ACETAMINOPHEN 325 MG PO TABS
650.0000 mg | ORAL_TABLET | Freq: Four times a day (QID) | ORAL | Status: DC | PRN
  Administered 2017-12-24: 650 mg via ORAL
  Filled 2017-12-24: qty 2

## 2017-12-24 MED ORDER — AMLODIPINE BESYLATE 10 MG PO TABS
10.0000 mg | ORAL_TABLET | Freq: Every day | ORAL | Status: DC
  Administered 2017-12-24 – 2017-12-26 (×3): 10 mg via ORAL
  Filled 2017-12-24: qty 2
  Filled 2017-12-24 (×2): qty 1

## 2017-12-24 MED ORDER — VANCOMYCIN HCL 10 G IV SOLR
1250.0000 mg | INTRAVENOUS | Status: DC
  Administered 2017-12-25 – 2017-12-26 (×2): 1250 mg via INTRAVENOUS
  Filled 2017-12-24 (×2): qty 1250

## 2017-12-24 MED ORDER — VANCOMYCIN HCL IN DEXTROSE 1-5 GM/200ML-% IV SOLN
1000.0000 mg | Freq: Once | INTRAVENOUS | Status: AC
  Administered 2017-12-24: 1000 mg via INTRAVENOUS
  Filled 2017-12-24: qty 200

## 2017-12-24 MED ORDER — HYDROMORPHONE HCL 1 MG/ML IJ SOLN
1.0000 mg | Freq: Once | INTRAMUSCULAR | Status: AC
  Administered 2017-12-24: 1 mg via INTRAVENOUS
  Filled 2017-12-24: qty 1

## 2017-12-24 MED ORDER — OXYCODONE HCL 5 MG PO TABS: 5.0000 mg | ORAL_TABLET | Freq: Four times a day (QID) | ORAL | Status: DC | PRN

## 2017-12-24 MED ORDER — OXYCODONE HCL 5 MG PO TABS
5.0000 mg | ORAL_TABLET | Freq: Four times a day (QID) | ORAL | Status: DC | PRN
  Administered 2017-12-24 – 2017-12-26 (×4): 10 mg via ORAL
  Filled 2017-12-24 (×3): qty 2

## 2017-12-24 MED ORDER — HYDROMORPHONE HCL 1 MG/ML IJ SOLN
0.5000 mg | INTRAMUSCULAR | Status: DC | PRN
  Administered 2017-12-24 – 2017-12-26 (×7): 0.5 mg via INTRAVENOUS
  Filled 2017-12-24 (×7): qty 0.5

## 2017-12-24 MED ORDER — HYDRALAZINE HCL 20 MG/ML IJ SOLN
10.0000 mg | Freq: Four times a day (QID) | INTRAMUSCULAR | Status: DC | PRN
  Administered 2017-12-24: 10 mg via INTRAVENOUS
  Filled 2017-12-24: qty 1

## 2017-12-24 MED ORDER — TIZANIDINE HCL 4 MG PO TABS: 2.0000 mg | ORAL_TABLET | Freq: Three times a day (TID) | ORAL | Status: DC | PRN

## 2017-12-24 MED ORDER — SODIUM CHLORIDE 0.9 % IV SOLN
2.0000 g | INTRAVENOUS | Status: DC
  Administered 2017-12-24 – 2017-12-26 (×3): 2 g via INTRAVENOUS
  Filled 2017-12-24: qty 2
  Filled 2017-12-24: qty 20
  Filled 2017-12-24: qty 2

## 2017-12-24 MED ORDER — HYDROCHLOROTHIAZIDE 25 MG PO TABS
50.0000 mg | ORAL_TABLET | Freq: Every day | ORAL | Status: DC
  Administered 2017-12-24 – 2017-12-26 (×3): 50 mg via ORAL
  Filled 2017-12-24 (×3): qty 2

## 2017-12-24 MED ORDER — LABETALOL HCL 5 MG/ML IV SOLN
5.0000 mg | Freq: Four times a day (QID) | INTRAVENOUS | Status: DC | PRN
  Administered 2017-12-24 – 2017-12-25 (×2): 5 mg via INTRAVENOUS
  Filled 2017-12-24 (×2): qty 4

## 2017-12-24 MED ORDER — HEPARIN SODIUM (PORCINE) 5000 UNIT/ML IJ SOLN
5000.0000 [IU] | Freq: Three times a day (TID) | INTRAMUSCULAR | Status: DC
Start: 2017-12-24 — End: 2017-12-26
  Administered 2017-12-24 – 2017-12-25 (×2): 5000 [IU] via SUBCUTANEOUS
  Filled 2017-12-24 (×5): qty 1

## 2017-12-24 MED ORDER — MORPHINE SULFATE (PF) 4 MG/ML IV SOLN
2.0000 mg | INTRAVENOUS | Status: DC | PRN
  Administered 2017-12-24: 2 mg via INTRAVENOUS
  Filled 2017-12-24: qty 1

## 2017-12-24 NOTE — ED Notes (Signed)
Patient requesting pain medication. Dr. Wendee Beavers paged.

## 2017-12-24 NOTE — ED Notes (Signed)
Patient provided with female urinal to obtain urine sample.

## 2017-12-24 NOTE — ED Notes (Signed)
Attempted to call report x 1  

## 2017-12-24 NOTE — ED Notes (Signed)
Attempt [place] IV x2 unsuccessful LAC and Lfa. Manuela Schwartz RN verbalizes will attempt.

## 2017-12-24 NOTE — H&P (Signed)
History and Physical    Linda [name] YKD:[id number] DOB: 11-11-1995 DOA: 12/24/2017  PCP: Tonette Bihari, MD Patient coming from: home  I have personally briefly reviewed patient's old medical records in [place]  Chief Complaint: right knee pain  HPI: Linda [name] is a 22 y.o. female with medical history significant of lupus on prednisone and htn. Presenting with complaints of 1 week of worsening knee pain. Problem has been persistent and getting worse. Has had evaluation by ortho in the past. Associated with purulent drainage. Patient has pain with flexing of the knee. Nothing she is aware of makes it better.  Given persistent problem patient presented for further evaluation and recommendations.   ED Course: Pt found to have purulent drainage and we were consulted for further evaluation and recommendations. Wound cx obtained in the ED.   Review of Systems: As per HPI otherwise 10 point review of systems negative.    Past Medical History:  Diagnosis Date  . Lupus ([place])   . Migraines   . Mixed connective tissue disease [facility])     Past Surgical History:  Procedure Laterality Date  . I&D EXTREMITY Right 08/06/2017   Procedure: IRRIGATION AND DEBRIDEMENT KNEE;  Surgeon: Shona Needles, MD;  Location: [facility];  Service: Orthopedics;  Laterality: Right;  . MULTIPLE TOOTH EXTRACTIONS       reports that she has been smoking cigarettes and cigars.  She started smoking about 2 years ago. She has been smoking about 0.00 packs per day. She has never used smokeless tobacco. She reports that she does not drink alcohol or use drugs.  Allergies  Allergen Reactions  . Vicodin [Hydrocodone-Acetaminophen] Itching and Rash    Family History  Problem Relation Age of Onset  . Lupus Mother   . Hypertension Mother   . Kidney disease Mother   . Diabetes Maternal Grandmother     Prior to Admission medications   Medication Sig Start Date End Date Taking? Authorizing  Provider  amLODipine (NORVASC) 10 MG tablet Take 1 tablet (10 mg total) by mouth daily. 11/25/17  Yes Rosita Fire, MD  dicyclomine (BENTYL) 10 MG capsule Take 1 capsule (10 mg total) by mouth 4 (four) times daily -  before meals and at bedtime. 12/04/17  Yes Purohit, Konrad Dolores, MD  hydrochlorothiazide (HYDRODIURIL) 50 MG tablet Take 1 tablet (50 mg total) by mouth daily. 12/05/17  Yes Purohit, Konrad Dolores, MD  lisinopril (PRINIVIL,ZESTRIL) 20 MG tablet Take 1 tablet (20 mg total) by mouth daily. 12/05/17  Yes Purohit, Konrad Dolores, MD  predniSONE (DELTASONE) 20 MG tablet Take 2 tablets (40 mg total) by mouth daily. 12/04/17  Yes Purohit, Konrad Dolores, MD  tiZANidine (ZANAFLEX) 2 MG tablet Take 1 tablet (2 mg total) by mouth every 8 (eight) hours as needed for muscle spasms. 11/25/17  Yes Rosita Fire, MD  oxyCODONE (OXY IR/ROXICODONE) 5 MG immediate release tablet Take 1 tablet (5 mg total) by mouth every 6 (six) hours as needed for breakthrough pain. Patient not taking: Reported on 12/24/2017 12/04/17   Cristy Folks, MD    Physical Exam: Vitals:   12/24/17 1352 12/24/17 1432 12/24/17 1520 12/24/17 1545  BP: (!) 201/148 (!) 193/143 (!) 196/145 (!) 186/139  Pulse: (!) 115 (!) 116 100 100  Resp: (!) 22 20 18 16   Temp:      TempSrc:      SpO2: 100% 100% 98% 100%  Weight:      Height:  Constitutional: NAD, calm, comfortable Vitals:   12/24/17 1352 12/24/17 1432 12/24/17 1520 12/24/17 1545  BP: (!) 201/148 (!) 193/143 (!) 196/145 (!) 186/139  Pulse: (!) 115 (!) 116 100 100  Resp: (!) 22 20 18 16   Temp:      TempSrc:      SpO2: 100% 100% 98% 100%  Weight:      Height:       Eyes: PERRL, lids and conjunctivae normal ENMT: Mucous membranes are moist. Posterior pharynx clear of any exudate or lesions. Normal dentition. Neck: normal, supple, no masses, no thyromegaly Respiratory: clear to auscultation bilaterally, no wheezing, no crackles. Normal respiratory effort. No accessory muscle  use.  Cardiovascular: Regular rate and rhythm, no murmurs / rubs / gallops. No extremity edema. 2+ pedal pulses. No carotid bruits.  Abdomen: no tenderness, no masses palpated. No hepatosplenomegaly. Bowel sounds positive.  Musculoskeletal: no clubbing / cyanosis. No joint deformity upper and lower extremities. Good ROM, no contractures. Normal muscle tone.  Skin: Right knee cellulitis and pain with flexion, with purulence Neurologic: patient answers questions appropriately, no facial asymmetry. Moves all extremities Psychiatric: Normal judgment and insight. Alert and oriented x 3. Normal mood.    Labs on Admission: I have personally reviewed following labs and imaging studies  CBC: Recent Labs  Lab 12/24/17 1355  WBC 13.8*  NEUTROABS 7.9*  HGB 10.9*  HCT 34.2*  MCV 99.1  PLT 161*   Basic Metabolic Panel: Recent Labs  Lab 12/24/17 1355  NA 140  K 3.5  CL 107  CO2 20*  GLUCOSE 75  BUN 9  CREATININE 1.26*  CALCIUM 9.1   GFR: Estimated Creatinine Clearance: 82.5 mL/min (A) (by C-G formula based on SCr of 1.26 mg/dL (H)). Liver Function Tests: Recent Labs  Lab 12/24/17 1355  AST 16  ALT 9*  ALKPHOS 62  BILITOT 1.4*  PROT 7.4  ALBUMIN 3.6   No results for input(s): LIPASE, AMYLASE in the last 168 hours. No results for input(s): AMMONIA in the last 168 hours. Coagulation Profile: No results for input(s): INR, PROTIME in the last 168 hours. Cardiac Enzymes: No results for input(s): CKTOTAL, CKMB, CKMBINDEX, TROPONINI in the last 168 hours. BNP (last 3 results) No results for input(s): PROBNP in the last 8760 hours. HbA1C: No results for input(s): HGBA1C in the last 72 hours. CBG: No results for input(s): GLUCAP in the last 168 hours. Lipid Profile: No results for input(s): CHOL, HDL, LDLCALC, TRIG, CHOLHDL, LDLDIRECT in the last 72 hours. Thyroid Function Tests: No results for input(s): TSH, T4TOTAL, FREET4, T3FREE, THYROIDAB in the last 72 hours. Anemia  Panel: No results for input(s): VITAMINB12, FOLATE, FERRITIN, TIBC, IRON, RETICCTPCT in the last 72 hours. Urine analysis:    Component Value Date/Time   COLORURINE YELLOW 11/29/2017 1320   APPEARANCEUR CLEAR 11/29/2017 1320   LABSPEC 1.008 11/29/2017 1320   PHURINE 7.0 11/29/2017 1320   GLUCOSEU NEGATIVE 11/29/2017 1320   HGBUR NEGATIVE 11/29/2017 1320   BILIRUBINUR NEGATIVE 11/29/2017 1320   KETONESUR NEGATIVE 11/29/2017 1320   PROTEINUR NEGATIVE 11/29/2017 1320   UROBILINOGEN 1.0 03/30/2015 2328   NITRITE NEGATIVE 11/29/2017 1320   LEUKOCYTESUR NEGATIVE 11/29/2017 1320    Radiological Exams on Admission: No results found.   Assessment/Plan Active Problems:   Cellulitis - Overlying Right knee with purulent drainage sent off for culture by ED. Will place on Rocephin and Vancomycin - Ortho to evaluate and will defer arthrocentesis to them. Body fluid culture and gram stain also ordered.  Primary hypertension - continue home medication regimen. - could be exacerbated due to active pain at right knee    Lupus erythematosus  - continue prior to admission medication regimen.   DVT prophylaxis: heparin Code Status: full Family Communication: none at bedside Disposition Plan: med surg Consults called: ED consulted Ortho: Dr. Mylo Red Admission status: obs  Velvet Bathe MD [facility] Pager [phone]  If 7PM-7AM, please contact night-coverage [email or web address] Password [facility]  12/24/2017, 4:03 PM

## 2017-12-24 NOTE — ED Notes (Signed)
Bed: WA07 Expected date:  Expected time:  Means of arrival:  Comments: 22 yo post op complication

## 2017-12-24 NOTE — ED Notes (Signed)
Patient made aware urine sample is needed. 

## 2017-12-24 NOTE — ED Notes (Signed)
RN at bedside attempting US IV.

## 2017-12-24 NOTE — Progress Notes (Signed)
Pharmacy Antibiotic Note  Linda [name] is a 22 y.o. female with hx lupus and septic R knee presented to the ED on 12/24/2017 with c/o right knee swelling, redness, pain and discharge from surgical site.  To start ceftriaxone and vancomycin for cellulitis.  - scr 1.26 (crcl~82)  Plan: - ceftriaxone 2gm IV q24h. Pharmacy will sign off for ceftriaxone since no renal adj is needed with this abx. - vancomycin 1000 mg IV x1 given in the ED at 1351, then 1250 mg IV q24h for est AUC 435 - monitor renal function  _____________________________________________  Height: 5\' 6"  (167.6 cm) Weight: 215 lb (97.5 kg) IBW/kg (Calculated) : 59.3  Temp (24hrs), Avg:98.4 F (36.9 C), Min:98.4 F (36.9 C), Max:98.4 F (36.9 C)  Recent Labs  Lab 12/24/17 1331 12/24/17 1355  WBC  --  13.8*  CREATININE  --  1.26*  LATICACIDVEN 1.47  --     Estimated Creatinine Clearance: 82.5 mL/min (A) (by C-G formula based on SCr of 1.26 mg/dL (H)).    Allergies  Allergen Reactions  . Vicodin [Hydrocodone-Acetaminophen] Itching and Rash     Thank you for allowing pharmacy to be a part of this patient's care.  Lynelle Doctor 12/24/2017 4:17 PM

## 2017-12-24 NOTE — ED Notes (Signed)
ED TO INPATIENT HANDOFF REPORT  Name/Age/Gender Linda [name] 22 y.o. female  Code Status Code Status History    Date Active Date Inactive Code Status Order ID Comments User Context   11/29/2017 1740 12/04/2017 1719 Full Code [id number]  Shelly Coss, MD ED   11/22/2017 0955 11/25/2017 1422 Full Code [id number]  Patrecia Pour, MD Inpatient   08/02/2017 2005 08/09/2017 1753 Full Code [id number]  Steve Rattler, DO ED   04/20/2017 2033 04/22/2017 2105 Full Code [id number]  Carlyle Dolly, MD Inpatient   04/09/2017 1351 04/14/2017 2123 Full Code [id number]  Smiley Houseman, MD ED      Home/SNF/Other Home  Chief Complaint knee pain  Level of Care/Admitting Diagnosis ED Disposition    ED Disposition Condition [facility]: [facility] [[id number]]  Level of Care: Med-Surg [16]  Diagnosis: Cellulitis [389373]  Admitting Physician: Velvet Bathe [[id number]]  Attending Physician: Velvet Bathe [[id number]]  PT Class (Do Not Modify): Observation [104]  PT Acc Code (Do Not Modify): Observation [10022]       Medical History Past Medical History:  Diagnosis Date  . Lupus ([place])   . Migraines   . Mixed connective tissue disease ([facility])     Allergies Allergies  Allergen Reactions  . Vicodin [Hydrocodone-Acetaminophen] Itching and Rash    IV Location/Drains/Wounds Patient Lines/Drains/Airways Status   Active Line/Drains/Airways    Name:   Placement date:   Placement time:   Site:   Days:   Peripheral IV 11/29/17 Left Forearm   11/29/17    1325    Forearm   25   Peripheral IV 12/24/17 Right;Upper Arm   12/24/17    1325    Arm   less than 1          Labs/Imaging Results for orders placed or performed during the hospital encounter of 12/24/17 (from the past 48 hour(s))  I-Stat CG4 Lactic Acid, ED     Status: None   Collection Time: 12/24/17  1:31 PM  Result Value Ref Range   Lactic Acid, Venous 1.47 0.5 - 1.9 mmol/L  I-Stat beta hCG blood, ED      Status: None   Collection Time: 12/24/17  1:31 PM  Result Value Ref Range   I-stat hCG, quantitative <5.0 <5 mIU/mL   Comment 3            Comment:   GEST. AGE      CONC.  (mIU/mL)   <=1 WEEK        5 - 50     2 WEEKS       50 - 500     3 WEEKS       100 - 10,000     4 WEEKS     1,000 - 30,000        FEMALE AND NON-PREGNANT FEMALE:     LESS THAN 5 mIU/mL   Comprehensive metabolic panel     Status: Abnormal   Collection Time: 12/24/17  1:55 PM  Result Value Ref Range   Sodium 140 135 - 145 mmol/L   Potassium 3.5 3.5 - 5.1 mmol/L   Chloride 107 101 - 111 mmol/L   CO2 20 (L) 22 - 32 mmol/L   Glucose, Bld 75 65 - 99 mg/dL   BUN 9 6 - 20 mg/dL   Creatinine, Ser 1.26 (H) 0.44 - 1.00 mg/dL   Calcium 9.1 8.9 - 10.3 mg/dL   Total Protein 7.4  6.5 - 8.1 g/dL   Albumin 3.6 3.5 - 5.0 g/dL   AST 16 15 - 41 U/L   ALT 9 (L) 14 - 54 U/L   Alkaline Phosphatase 62 38 - 126 U/L   Total Bilirubin 1.4 (H) 0.3 - 1.2 mg/dL   GFR calc non Af Amer 60 (L) >60 mL/min   GFR calc Af Amer >60 >60 mL/min    Comment: (NOTE) The eGFR has been calculated using the CKD EPI equation. This calculation has not been validated in all clinical situations. eGFR's persistently <60 mL/min signify possible Chronic Kidney Disease.    Anion gap 13 5 - 15    Comment: Performed at [facility], [place] [address], [place], [place] [address]  CBC with Differential     Status: Abnormal   Collection Time: 12/24/17  1:55 PM  Result Value Ref Range   WBC 13.8 (H) 4.0 - 10.5 K/uL   RBC 3.45 (L) 3.87 - 5.11 MIL/uL   Hemoglobin 10.9 (L) 12.0 - 15.0 g/dL   HCT 34.2 (L) 36.0 - 46.0 %   MCV 99.1 78.0 - 100.0 fL   MCH 31.6 26.0 - 34.0 pg   MCHC 31.9 30.0 - 36.0 g/dL   RDW 15.5 11.5 - 15.5 %   Platelets 149 (L) 150 - 400 K/uL   Neutrophils Relative % 57 %   Neutro Abs 7.9 (H) 1.7 - 7.7 K/uL   Lymphocytes Relative 32 %   Lymphs Abs 4.5 (H) 0.7 - 4.0 K/uL   Monocytes Relative 10 %   Monocytes Absolute 1.3  (H) 0.1 - 1.0 K/uL   Eosinophils Relative 1 %   Eosinophils Absolute 0.1 0.0 - 0.7 K/uL   Basophils Relative 0 %   Basophils Absolute 0.0 0.0 - 0.1 K/uL    Comment: Performed at [facility], [place] [address], [place], [place] [address]  Urinalysis, Routine w reflex microscopic     Status: Abnormal   Collection Time: 12/24/17  4:37 PM  Result Value Ref Range   Color, Urine YELLOW YELLOW   APPearance HAZY (A) CLEAR   Specific Gravity, Urine 1.015 1.005 - 1.030   pH 5.0 5.0 - 8.0   Glucose, UA NEGATIVE NEGATIVE mg/dL   Hgb urine dipstick SMALL (A) NEGATIVE   Bilirubin Urine NEGATIVE NEGATIVE   Ketones, ur NEGATIVE NEGATIVE mg/dL   Protein, ur 100 (A) NEGATIVE mg/dL   Nitrite NEGATIVE NEGATIVE   Leukocytes, UA SMALL (A) NEGATIVE   RBC / HPF 0-5 0 - 5 RBC/hpf   WBC, UA 6-10 0 - 5 WBC/hpf   Bacteria, UA RARE (A) NONE SEEN   Squamous Epithelial / LPF 0-5 0 - 5    Comment: Please note change in reference range.   Mucus PRESENT     Comment: Performed at [facility], [place] [address], [place],  [address]   No results found.  Pending Labs Unresulted Labs (From admission, onward)   Start     Ordered   12/25/17 0500  Creatinine, serum  Daily,   R     12/24/17 1627   12/24/17 1558  Body fluid culture  Once,   R    Question Answer Comment  Are there also cytology or pathology orders on this specimen? No   Patient immune status Immunocompromised      12/24/17 1557   12/24/17 1557  Body fluid cell count with differential  Once,   R    Question:  Are  there also cytology or pathology orders on this specimen?  Answer:  No   12/24/17 1557   12/24/17 1332  Culture, blood (Routine X 2) w Reflex to ID Panel  BLOOD CULTURE X 2,   STAT     12/24/17 1335   12/24/17 1332  Wound or Superficial Culture  Once,   STAT     12/24/17 1335   Signed and Held  CBC  (heparin)  Once,   R    Comments:  Baseline for heparin therapy IF NOT ALREADY DRAWN.  Notify MD if  PLT < 100 K.    Signed and Held   Signed and Held  Creatinine, serum  (heparin)  Once,   R    Comments:  Baseline for heparin therapy IF NOT ALREADY DRAWN.    Signed and Held   Signed and Held  Basic metabolic panel  Tomorrow morning,   R     Signed and Held   Signed and Held  CBC  Tomorrow morning,   R     Signed and Held      Vitals/Pain Today's Vitals   12/24/17 1830 12/24/17 1832 12/24/17 1845 12/24/17 1915  BP: (!) 173/119  (!) 162/120 (!) 165/108  Pulse: (!) 101     Resp:      Temp:      TempSrc:      SpO2: 99%     Weight:      Height:      PainSc:  8       Isolation Precautions No active isolations  Medications Medications  amLODipine (NORVASC) tablet 10 mg (10 mg Oral Given 12/24/17 1825)  hydrochlorothiazide (HYDRODIURIL) tablet 50 mg (50 mg Oral Given 12/24/17 1733)  lisinopril (PRINIVIL,ZESTRIL) tablet 20 mg (20 mg Oral Given 12/24/17 1734)  vancomycin (VANCOCIN) 1,250 mg in sodium chloride 0.9 % 250 mL IVPB (has no administration in time range)  cefTRIAXone (ROCEPHIN) 2 g in sodium chloride 0.9 % 100 mL IVPB (0 g Intravenous Stopped 12/24/17 1748)  hydrALAZINE (APRESOLINE) injection 10 mg (10 mg Intravenous Given 12/24/17 1723)  labetalol (NORMODYNE,TRANDATE) injection 5 mg (5 mg Intravenous Given 12/24/17 1823)  oxyCODONE (Oxy IR/ROXICODONE) immediate release tablet 5-10 mg (10 mg Oral Given 12/24/17 1731)  HYDROmorphone (DILAUDID) injection 1 mg (1 mg Intravenous Given 12/24/17 1351)  vancomycin (VANCOCIN) IVPB 1000 mg/200 mL premix (0 mg Intravenous Stopped 12/24/17 1451)  labetalol (NORMODYNE,TRANDATE) injection 10 mg (10 mg Intravenous Given 12/24/17 1434)    Mobility walks

## 2017-12-24 NOTE — ED Notes (Signed)
Per 5E RN, Dr. Wendee Beavers recommends administering patient's home BP medications prior to transport upstairs. Charge RN made aware of delay in transport.

## 2017-12-24 NOTE — ED Notes (Signed)
Patient given ice chips. 

## 2017-12-24 NOTE — ED Provider Notes (Signed)
[facility] DEPT Provider Note   CSN: [id number] Arrival date & time: 12/24/17  1156     History   Chief Complaint Chief Complaint  Patient presents with  . Knee Pain  . Hypertension    HPI Linda [name] is a 22 y.o. female.  HPI Patient presents with right knee swelling, redness, pain, and discharge from previous surgical site.  She is had prepatellar bursitis in the past requiring I&D and antibiotics.  She denies any known injury.  Denies fever or chills.  Pain is worse with standing, palpation and range of motion of the knee.  She has not taken her blood pressure medications today. Past Medical History:  Diagnosis Date  . Lupus ([place])   . Migraines   . Mixed connective tissue disease [facility])     Patient Active Problem List   Diagnosis Date Noted  . Hypertensive urgency 11/29/2017  . Enteritis 11/29/2017  . Nausea vomiting and diarrhea 11/29/2017  . Leucocytosis 11/29/2017  . Septic prepatellar bursitis of right knee 08/19/2017  . Grief reaction 08/19/2017  . Right knee pain   . Elbow pain, right   . Back pain 05/04/2017  . Bilateral knee swelling 04/20/2017  . History of connective tissue disease   . Leg swelling   . Abdominal pain   . Lupus erythematosus   . Cellulitis 04/09/2017  . Primary hypertension 12/11/2016  . Mixed connective tissue disease ([place]) 02/11/2016  . High BMI 01/19/2014  . Implanon in place 01/19/2014    Past Surgical History:  Procedure Laterality Date  . I&D EXTREMITY Right 08/06/2017   Procedure: IRRIGATION AND DEBRIDEMENT KNEE;  Surgeon: Shona Needles, MD;  Location: [place];  Service: Orthopedics;  Laterality: Right;  . MULTIPLE TOOTH EXTRACTIONS       OB History    Gravida  0   Para  0   Term  0   Preterm  0   AB  0   Living  0     SAB  0   TAB  0   Ectopic  0   Multiple  0   Live Births               Home Medications    Prior to Admission medications   Medication Sig  Start Date End Date Taking? Authorizing Provider  amLODipine (NORVASC) 10 MG tablet Take 1 tablet (10 mg total) by mouth daily. 11/25/17  Yes Rosita Fire, MD  dicyclomine (BENTYL) 10 MG capsule Take 1 capsule (10 mg total) by mouth 4 (four) times daily -  before meals and at bedtime. 12/04/17  Yes Purohit, Konrad Dolores, MD  hydrochlorothiazide (HYDRODIURIL) 50 MG tablet Take 1 tablet (50 mg total) by mouth daily. 12/05/17  Yes Purohit, Konrad Dolores, MD  lisinopril (PRINIVIL,ZESTRIL) 20 MG tablet Take 1 tablet (20 mg total) by mouth daily. 12/05/17  Yes Purohit, Konrad Dolores, MD  predniSONE (DELTASONE) 20 MG tablet Take 2 tablets (40 mg total) by mouth daily. 12/04/17  Yes Purohit, Konrad Dolores, MD  tiZANidine (ZANAFLEX) 2 MG tablet Take 1 tablet (2 mg total) by mouth every 8 (eight) hours as needed for muscle spasms. 11/25/17  Yes Rosita Fire, MD  oxyCODONE (OXY IR/ROXICODONE) 5 MG immediate release tablet Take 1 tablet (5 mg total) by mouth every 6 (six) hours as needed for breakthrough pain. Patient not taking: Reported on 12/24/2017 12/04/17   Cristy Folks, MD    Family History Family History  Problem Relation Age of Onset  . Lupus Mother   . Hypertension Mother   . Kidney disease Mother   . Diabetes Maternal Grandmother     Social History Social History   Tobacco Use  . Smoking status: Current Every Day Smoker    Packs/day: 0.00    Types: Cigarettes, Cigars    Start date: 02/11/2015  . Smokeless tobacco: Never Used  Substance Use Topics  . Alcohol use: No    Alcohol/week: 0.0 oz    Comment: social  . Drug use: No     Allergies   Vicodin [hydrocodone-acetaminophen]   Review of Systems Review of Systems  Constitutional: Negative for chills and fever.  Eyes: Negative for visual disturbance.  Respiratory: Negative for cough and shortness of breath.   Cardiovascular: Negative for chest pain.  Gastrointestinal: Negative for abdominal pain, diarrhea, nausea and vomiting.    Genitourinary: Negative for dysuria and flank pain.  Musculoskeletal: Positive for arthralgias and joint swelling. Negative for myalgias, neck pain and neck stiffness.  Skin: Positive for color change and wound.  Neurological: Negative for weakness, numbness and headaches.  All other systems reviewed and are negative.    Physical Exam Updated Vital Signs BP (!) 193/143   Pulse (!) 116   Temp 98.4 F (36.9 C) (Oral)   Resp 20   Ht 5\' 6"  (1.676 m)   Wt 97.5 kg (215 lb)   SpO2 100%   BMI 34.70 kg/m   Physical Exam  Constitutional: She is oriented to person, place, and time. She appears well-developed and well-nourished. She appears distressed.  Patient is tearful  HENT:  Head: Normocephalic and atraumatic.  Mouth/Throat: Oropharynx is clear and moist.  Eyes: Pupils are equal, round, and reactive to light. EOM are normal.  Neck: Normal range of motion. Neck supple.  Cardiovascular: Regular rhythm.  Tachycardia  Pulmonary/Chest: Effort normal and breath sounds normal.  Abdominal: Soft. Bowel sounds are normal. There is no tenderness. There is no rebound and no guarding.  Musculoskeletal: She exhibits edema and tenderness.  Patient with redness, warmth and swelling to the right knee.  Puncture wound in the prepatellar space with small amount of purulent discharge.  Unable to range the knee due to pain.  Distal pulses 2+.  Neurological: She is alert and oriented to person, place, and time.  Moving all extremities without focal deficit though difficult to evaluate right lower extremity due to patient's pain.  Sensation is grossly intact.  Skin: Skin is warm and dry. Capillary refill takes less than 2 seconds. No rash noted. There is erythema.  Nursing note and vitals reviewed.    ED Treatments / Results  Labs (all labs ordered are listed, but only abnormal results are displayed) Labs Reviewed  COMPREHENSIVE METABOLIC PANEL - Abnormal; Notable for the following components:       Result Value   CO2 20 (*)    Creatinine, Ser 1.26 (*)    ALT 9 (*)    Total Bilirubin 1.4 (*)    GFR calc non Af Amer 60 (*)    All other components within normal limits  CBC WITH DIFFERENTIAL/PLATELET - Abnormal; Notable for the following components:   WBC 13.8 (*)    RBC 3.45 (*)    Hemoglobin 10.9 (*)    HCT 34.2 (*)    Platelets 149 (*)    Neutro Abs 7.9 (*)    Lymphs Abs 4.5 (*)    Monocytes Absolute 1.3 (*)    All other  components within normal limits  CULTURE, BLOOD (ROUTINE X 2)  CULTURE, BLOOD (ROUTINE X 2)  AEROBIC CULTURE (SUPERFICIAL SPECIMEN)  URINALYSIS, ROUTINE W REFLEX MICROSCOPIC  I-STAT CG4 LACTIC ACID, ED  I-STAT BETA HCG BLOOD, ED ([facility], [facility], [facility] ONLY)    EKG None  Radiology No results found.  Procedures Procedures (including critical care time)  Medications Ordered in ED Medications  HYDROmorphone (DILAUDID) injection 1 mg (1 mg Intravenous Given 12/24/17 1351)  vancomycin (VANCOCIN) IVPB 1000 mg/200 mL premix (1,000 mg Intravenous New Bag/Given 12/24/17 1351)  labetalol (NORMODYNE,TRANDATE) injection 10 mg (10 mg Intravenous Given 12/24/17 1434)     Initial Impression / Assessment and Plan / ED Course  I have reviewed the triage vital signs and the nursing notes.  Pertinent labs & imaging results that were available during my care of the patient were reviewed by me and considered in my medical decision making (see chart for details).     Discussed with Dr. Doreatha Martin.  Advises starting IV antibiotics and admitting to hospitalist.  Does not believe patient needs imaging at this time. Improvement of blood pressure with IV labetalol.  Initiated vancomycin.  Normal lactic acid.  Discussed with hospitalist who will admit. Final Clinical Impressions(s) / ED Diagnoses   Final diagnoses:  Hypertensive urgency  Cellulitis of right knee    ED Discharge Orders    None       Julianne Rice, MD 12/24/17 1500

## 2017-12-24 NOTE — ED Triage Notes (Addendum)
Per EMS, patient c/o right knee pain since December after treatment for infection in same knee. Patient reports drainage and pain from knee since this morning. Hypertensive with EMS. Denies taking BP meds today. Previous admission for HTN. Hx Lupus. Denies chest pain, headache, dizziness, SOB.

## 2017-12-24 NOTE — ED Notes (Signed)
MD paged and made aware of patient's trending blood pressures.

## 2017-12-25 ENCOUNTER — Other Ambulatory Visit: Payer: Self-pay

## 2017-12-25 DIAGNOSIS — I160 Hypertensive urgency: Secondary | ICD-10-CM | POA: Diagnosis not present

## 2017-12-25 DIAGNOSIS — D72829 Elevated white blood cell count, unspecified: Secondary | ICD-10-CM

## 2017-12-25 DIAGNOSIS — L930 Discoid lupus erythematosus: Secondary | ICD-10-CM

## 2017-12-25 DIAGNOSIS — L03115 Cellulitis of right lower limb: Secondary | ICD-10-CM

## 2017-12-25 DIAGNOSIS — I10 Essential (primary) hypertension: Secondary | ICD-10-CM | POA: Diagnosis not present

## 2017-12-25 LAB — BASIC METABOLIC PANEL
Anion gap: 15 (ref 5–15)
BUN: 9 mg/dL (ref 6–20)
CO2: 20 mmol/L — ABNORMAL LOW (ref 22–32)
Calcium: 9.2 mg/dL (ref 8.9–10.3)
Chloride: 103 mmol/L (ref 101–111)
Creatinine, Ser: 1.19 mg/dL — ABNORMAL HIGH (ref 0.44–1.00)
GFR calc Af Amer: >60 mL/min (ref >60)
GFR calc non Af Amer: >60 mL/min (ref >60)
Glucose, Bld: 121 mg/dL — ABNORMAL HIGH (ref 65–99)
Potassium: 3.9 mmol/L (ref 3.5–5.1)
Sodium: 138 mmol/L (ref 135–145)

## 2017-12-25 LAB — CBC
HCT: 41.1 % (ref 36.0–46.0)
Hemoglobin: 13.5 g/dL (ref 12.0–15.0)
MCH: 32.5 pg (ref 26.0–34.0)
MCHC: 32.8 g/dL (ref 30.0–36.0)
MCV: 98.8 fL (ref 78.0–100.0)
Platelets: 149 10*3/uL — ABNORMAL LOW (ref 150–400)
RBC: 4.16 MIL/uL (ref 3.87–5.11)
RDW: 15.2 % (ref 11.5–15.5)
WBC: 9.4 10*3/uL (ref 4.0–10.5)

## 2017-12-25 MED ORDER — HYDRALAZINE HCL 20 MG/ML IJ SOLN
10.0000 mg | Freq: Four times a day (QID) | INTRAMUSCULAR | Status: DC | PRN
  Administered 2017-12-25: 10 mg via INTRAVENOUS
  Filled 2017-12-25: qty 1

## 2017-12-25 MED ORDER — OXYCODONE HCL 5 MG PO TABS: 5.0000 mg | ORAL_TABLET | Freq: Four times a day (QID) | ORAL | Status: DC | PRN

## 2017-12-25 NOTE — Consult Note (Signed)
Orthopaedic Trauma Progress Note  S: Patient returns to hospital after increased pain to the right leg.  I had performed an I&D 2018.  She returned to the hospital approximately 1 month ago with left knee cellulitis which improved with IV antibiotics.  She states that she was going to her primary care physician who at which point the swelling and pain got too bad so she presented to the emergency room.  Has had some drainage from her knee.  Character in nature from her notes from before her first surgery.  Denies any fevers or chills.  O:  Vitals:   12/25/17 1219 12/25/17 1452  BP: (!) 149/102 (!) 179/119  Pulse: 84 92  Resp:  18  Temp:  97.8 F (36.6 C)  SpO2:    Right lower extremity: Reveals well-healed incision.  There is an area above the incision which has a small amount of calcific drainage.  Significantly tender to palpation.  No fluctuance.  Knee range of motion is difficult due to pain.  Compartments are soft compressible.  She is neurovascularly intact.  Imaging: No new imaging  Labs:  CBC    Component Value Date/Time   WBC 9.4 12/25/2017 0527   RBC 4.16 12/25/2017 0527   HGB 13.5 12/25/2017 0527   HCT 41.1 12/25/2017 0527   PLT 149 (L) 12/25/2017 0527   MCV 98.8 12/25/2017 0527   MCH 32.5 12/25/2017 0527   MCHC 32.8 12/25/2017 0527   RDW 15.2 12/25/2017 0527   LYMPHSABS 4.5 (H) 12/24/2017 1355   MONOABS 1.3 (H) 12/24/2017 1355   EOSABS 0.1 12/24/2017 1355   BASOSABS 0.0 12/24/2017 13558    A/P: 22 year old female with a history of lupus with recurrent knee cellulitis.     The patient continues to have significant drainage And recurrent cellulitis.  I reviewed with her the fact that due to her lupus she has subcutaneous calcifications and I feel that this is what is straining when she sees the purulent material.  Due to the subcutaneous nature of these areas she will likely have intermittent flares of this.  I feel that there is no role for further advanced imaging.   She has had CTs and MRIs in the past which shows the calcifications.  I do not feel that there is any collection to be drained.  Feel that a course of IV antibiotics with transition to p.o. antibiotics would be appropriate.   Weightbearing: WBAT RLE Insicional and dressing care: Reinforce dressings as needed Orthopedic device(s): None Showering: Okay to shower VTE prophylaxis: per primary team  Pain control: Oxycodone Follow - up plan: Can follow up with me as needed  Shona Needles, MD [facility] [phone] (phone)

## 2017-12-25 NOTE — Progress Notes (Signed)
Patient ID: Linda [name], female   DOB: 1995/10/06, 22 y.o.   MRN: [id number]  PROGRESS NOTE    SEQUOYAH RAMONE  OMV:[id number] DOB: Aug 31, 1996 DOA: 12/24/2017 PCP: Tonette Bihari, MD   Brief Narrative:  22 year old female with history of mixed connective tissue disorder, lupus on prednisone, hypertension discharge in April 2019 for probable lupus enteritis treated with IV and subsequent oral steroids presented on 12/24/2017 with right knee pain with purulent discharge.  She was started on antibiotics and orthopedics was consulted by the ED provider.   Assessment & Plan:   Active Problems:   Primary hypertension   Cellulitis   Lupus erythematosus   Right knee cellulitis with concern for joint infection -Continue Rocephin and vancomycin.  Orthopedics was consulted by the ED provider.  Orthopedics evaluation is pending -Follow cultures. -Repeat a.m. labs including CRP  Leukocytosis -Probably secondary to above.  Resolved  Hypertensive urgency -Blood pressure still very extremely elevated.  Continue lisinopril, hydrochlorothiazide and amlodipine.  He is IV hydralazine if needed for extremely elevated blood pressures  Connective tissue disorder/lupus -Continue prednisone.  Outpatient follow-up with rheumatology  Thrombocytopenia -Questionable cause.  No signs of bleeding.  Monitor   DVT prophylaxis: Heparin Code Status: Full Family Communication: None at bedside Disposition Plan: Depends on clinical outcome  Consultants: Orthopedics  Procedures: None  Antimicrobials: Rocephin and vancomycin from 12/24/2017 onwards   Subjective: Patient seen and examined at bedside.  She denies any overnight fever, nausea or vomiting.  Complains of right knee pain and discharge.  Objective: Vitals:   12/25/17 0908 12/25/17 0915 12/25/17 1009 12/25/17 1219  BP: (!) 149/107 (!) 169/134 (!) 157/106 (!) 149/102  Pulse: 88 (!) 106 86 84  Resp:      Temp:      TempSrc:        SpO2:      Weight:      Height:        Intake/Output Summary (Last 24 hours) at 12/25/2017 1230 Last data filed at 12/25/2017 0400 Gross per 24 hour  Intake 780 ml  Output 1800 ml  Net -1020 ml   Filed Weights   12/24/17 1203 12/24/17 2009  Weight: 97.5 kg (215 lb) 94.7 kg (208 lb 12.4 oz)    Examination:  General exam: Appears calm and comfortable  Respiratory system: Bilateral decreased breath sound at bases Cardiovascular system: S1 & S2 heard, rate controlled  gastrointestinal system: Abdomen is nondistended, soft and nontender. Normal bowel sounds heard. Extremities: No cyanosis, clubbing, edema.  Right knee dressing present   Data Reviewed: I have personally reviewed following labs and imaging studies  CBC: Recent Labs  Lab 12/24/17 1355 12/25/17 0527  WBC 13.8* 9.4  NEUTROABS 7.9*  --   HGB 10.9* 13.5  HCT 34.2* 41.1  MCV 99.1 98.8  PLT 149* 628*   Basic Metabolic Panel: Recent Labs  Lab 12/24/17 1355 12/25/17 0527  NA 140 138  K 3.5 3.9  CL 107 103  CO2 20* 20*  GLUCOSE 75 121*  BUN 9 9  CREATININE 1.26* 1.19*  CALCIUM 9.1 9.2   GFR: Estimated Creatinine Clearance: 86 mL/min (A) (by C-G formula based on SCr of 1.19 mg/dL (H)). Liver Function Tests: Recent Labs  Lab 12/24/17 1355  AST 16  ALT 9*  ALKPHOS 62  BILITOT 1.4*  PROT 7.4  ALBUMIN 3.6   No results for input(s): LIPASE, AMYLASE in the last 168 hours. No results for input(s): AMMONIA in the last 168 hours.  Coagulation Profile: No results for input(s): INR, PROTIME in the last 168 hours. Cardiac Enzymes: No results for input(s): CKTOTAL, CKMB, CKMBINDEX, TROPONINI in the last 168 hours. BNP (last 3 results) No results for input(s): PROBNP in the last 8760 hours. HbA1C: No results for input(s): HGBA1C in the last 72 hours. CBG: No results for input(s): GLUCAP in the last 168 hours. Lipid Profile: No results for input(s): CHOL, HDL, LDLCALC, TRIG, CHOLHDL, LDLDIRECT in the  last 72 hours. Thyroid Function Tests: No results for input(s): TSH, T4TOTAL, FREET4, T3FREE, THYROIDAB in the last 72 hours. Anemia Panel: No results for input(s): VITAMINB12, FOLATE, FERRITIN, TIBC, IRON, RETICCTPCT in the last 72 hours. Sepsis Labs: Recent Labs  Lab 12/24/17 1331  LATICACIDVEN 1.47    Recent Results (from the past 240 hour(s))  Culture, blood (Routine X 2) w Reflex to ID Panel     Status: None (Preliminary result)   Collection Time: 12/24/17  1:54 PM  Result Value Ref Range Status   Specimen Description   Final    BLOOD RIGHT ARM Performed at [facility] [address], [place], [place] [address]    Special Requests   Final    BOTTLES DRAWN AEROBIC AND ANAEROBIC Blood Culture adequate volume Performed at [facility] [address] [place], [place] [address]    Culture   Final    NO GROWTH < 24 HOURS Performed at [facility] [address] [place], [place] [address]    Report Status PENDING  Incomplete  Culture, blood (Routine X 2) w Reflex to ID Panel     Status: None (Preliminary result)   Collection Time: 12/24/17  1:55 PM  Result Value Ref Range Status   Specimen Description   Final    BLOOD LEFT ANTECUBITAL Performed at [facility] [address] [place], [place] [address]    Special Requests   Final    BOTTLES DRAWN AEROBIC ONLY Blood Culture results may not be optimal due to an inadequate volume of blood received in culture bottles Performed at [facility] [address] [place], [place] [address]    Culture   Final    NO GROWTH < 24 HOURS Performed at [facility] [address] [place], [place] [address]    Report Status PENDING  Incomplete  Wound or Superficial Culture     Status: None (Preliminary result)   Collection Time: 12/24/17  3:03 PM  Result Value Ref Range Status   Specimen Description   Final    KNEE Performed at [facility] [address] [place], [place] [address]    Special Requests   Final    NONE Performed at [facility], [place] [address] [place], [place] [address]    Gram Stain   Final    ABUNDANT WBC PRESENT, PREDOMINANTLY PMN RARE GRAM POSITIVE COCCI    Culture   Final    CULTURE REINCUBATED FOR BETTER GROWTH Performed at [facility], [place] [address], [place],  [address]    Report Status PENDING  Incomplete         Radiology Studies: No results found.      Scheduled Meds: . amLODipine  10 mg Oral Daily  . dicyclomine  10 mg Oral TID AC & HS  . heparin  5,000 Units Subcutaneous Q8H  . hydrochlorothiazide  50 mg Oral Daily  . lisinopril  20 mg Oral  Daily  . predniSONE  40 mg Oral Daily  . sodium chloride flush  3 mL Intravenous Q12H   Continuous Infusions: . sodium chloride    . cefTRIAXone (ROCEPHIN)  IV Stopped (12/24/17 1748)  . vancomycin 1,250 mg (12/25/17 0351)     LOS: 0 days        Aline August, MD [facility] Pager [phone]  If 7PM-7AM, please contact night-coverage [email or web address] Password [facility] 12/25/2017, 12:30 PM

## 2017-12-26 DIAGNOSIS — L03115 Cellulitis of right lower limb: Secondary | ICD-10-CM | POA: Diagnosis not present

## 2017-12-26 DIAGNOSIS — I160 Hypertensive urgency: Secondary | ICD-10-CM | POA: Diagnosis not present

## 2017-12-26 DIAGNOSIS — L930 Discoid lupus erythematosus: Secondary | ICD-10-CM | POA: Diagnosis not present

## 2017-12-26 DIAGNOSIS — I10 Essential (primary) hypertension: Secondary | ICD-10-CM

## 2017-12-26 LAB — CBC WITH DIFFERENTIAL/PLATELET
Basophils Absolute: 0 10*3/uL (ref 0.0–0.1)
Basophils Relative: 0 %
Eosinophils Absolute: 0.1 10*3/uL (ref 0.0–0.7)
Eosinophils Relative: 0 %
HCT: 37.9 % (ref 36.0–46.0)
Hemoglobin: 12.5 g/dL (ref 12.0–15.0)
Lymphocytes Relative: 20 %
Lymphs Abs: 3.8 10*3/uL (ref 0.7–4.0)
MCH: 32.5 pg (ref 26.0–34.0)
MCHC: 33 g/dL (ref 30.0–36.0)
MCV: 98.4 fL (ref 78.0–100.0)
Monocytes Absolute: 1.8 10*3/uL — ABNORMAL HIGH (ref 0.1–1.0)
Monocytes Relative: 10 %
Neutro Abs: 13.1 10*3/uL — ABNORMAL HIGH (ref 1.7–7.7)
Neutrophils Relative %: 70 %
Platelets: 195 10*3/uL (ref 150–400)
RBC: 3.85 MIL/uL — ABNORMAL LOW (ref 3.87–5.11)
RDW: 15.1 % (ref 11.5–15.5)
WBC: 18.8 10*3/uL — ABNORMAL HIGH (ref 4.0–10.5)

## 2017-12-26 LAB — COMPREHENSIVE METABOLIC PANEL
ALT: 7 U/L — ABNORMAL LOW (ref 14–54)
AST: 13 U/L — ABNORMAL LOW (ref 15–41)
Albumin: 3.1 g/dL — ABNORMAL LOW (ref 3.5–5.0)
Alkaline Phosphatase: 62 U/L (ref 38–126)
Anion gap: 11 (ref 5–15)
BUN: 12 mg/dL (ref 6–20)
CO2: 23 mmol/L (ref 22–32)
Calcium: 9.1 mg/dL (ref 8.9–10.3)
Chloride: 103 mmol/L (ref 101–111)
Creatinine, Ser: 1.37 mg/dL — ABNORMAL HIGH (ref 0.44–1.00)
GFR calc Af Amer: >60 mL/min (ref >60)
GFR calc non Af Amer: 54 mL/min — ABNORMAL LOW (ref >60)
Glucose, Bld: 98 mg/dL (ref 65–99)
Potassium: 3.7 mmol/L (ref 3.5–5.1)
Sodium: 137 mmol/L (ref 135–145)
Total Bilirubin: 0.5 mg/dL (ref 0.3–1.2)
Total Protein: 6.9 g/dL (ref 6.5–8.1)

## 2017-12-26 LAB — C-REACTIVE PROTEIN: CRP: 5 mg/dL — ABNORMAL HIGH (ref <1.0)

## 2017-12-26 LAB — MAGNESIUM: Magnesium: 1.6 mg/dL — ABNORMAL LOW (ref 1.7–2.4)

## 2017-12-26 MED ORDER — DOXYCYCLINE HYCLATE 100 MG PO TABS: 100.0000 mg | ORAL_TABLET | Freq: Two times a day (BID) | ORAL | 0 refills | Status: DC

## 2017-12-26 MED ORDER — OXYCODONE HCL 5 MG PO TABS: 5.0000 mg | ORAL_TABLET | Freq: Four times a day (QID) | ORAL | 0 refills | Status: DC | PRN

## 2017-12-26 NOTE — Discharge Summary (Signed)
Physician Discharge Summary  Linda [name] [name]:[id number] DOB: 1996-06-09 DOA: 12/24/2017  PCP: Tonette Bihari, MD  Admit date: 12/24/2017 Discharge date: 12/26/2017  Admitted From: Home Disposition:  Home  Recommendations for Outpatient Follow-up:  1. Follow up with PCP in 1 week 2. Outpatient follow-up with orthopedic/Dr. Haddix 3. Outpatient follow-up with rheumatologist at earliest [place]: No  Equipment/Devices: None  Discharge Condition: Stable CODE STATUS: Full Diet recommendation: Heart Healthy  Brief/Interim Summary: 22 year old female with history of mixed connective tissue disorder, lupus on prednisone, hypertension discharge in April 2019 for probable lupus enteritis treated with IV and subsequent oral steroids presented on 12/24/2017 with right knee pain with purulent discharge.  She was started on antibiotics and orthopedics was consulted by the ED provider.  Orthopedics/Dr. Doreatha Martin evaluated the patient who recommended IV antibiotics followed by oral antibiotics and no need for further surgical intervention.  Patient feels much better and wants to go home.  Orthopedics has cleared the patient for discharge.  She will be discharged on oral antibiotics.   Discharge Diagnoses:  Active Problems:   Primary hypertension   Cellulitis   Lupus erythematosus  Right knee cellulitis -Currently on Rocephin and vancomycin.   -Orthopedics/Dr. Haddix evaluated the patient who recommended IV antibiotics followed by oral antibiotics and no need for further surgical intervention.  He is of the opinion that the discharge is probably coming out from the subcutaneous calcifications in her skin.  Patient feels much better and wants to go home.  Orthopedics has cleared the patient for discharge.  She will be discharged on oral doxycycline.  Prior wound culture in December 2018 had shown MSSA. -Cultures negative so far -Patient follow-up with primary care provider and  orthopedics  Leukocytosis -Probably secondary to above and steroid use.    Outpatient follow-up  Hypertensive urgency -Pressure is improved but still elevated.  Continue lisinopril, hydrochlorothiazide and amlodipine.  Outpatient follow-up with PCP  Connective tissue disorder/lupus -Continue prednisone.  Outpatient follow-up with rheumatology  Thrombocytopenia -Questionable cause.    Resolved.    Discharge Instructions  Discharge Instructions    Call MD for:  difficulty breathing, headache or visual disturbances   Complete by:  As directed    Call MD for:  extreme fatigue   Complete by:  As directed    Call MD for:  hives   Complete by:  As directed    Call MD for:  persistant dizziness or light-headedness   Complete by:  As directed    Call MD for:  persistant nausea and vomiting   Complete by:  As directed    Call MD for:  severe uncontrolled pain   Complete by:  As directed    Call MD for:  temperature >100.4   Complete by:  As directed    Diet - low sodium heart healthy   Complete by:  As directed    Increase activity slowly   Complete by:  As directed      Allergies as of 12/26/2017      Reactions   Vicodin [hydrocodone-acetaminophen] Itching, Rash      Medication List    TAKE these medications   amLODipine 10 MG tablet Commonly known as:  NORVASC Take 1 tablet (10 mg total) by mouth daily.   dicyclomine 10 MG capsule Commonly known as:  BENTYL Take 1 capsule (10 mg total) by mouth 4 (four) times daily -  before meals and at bedtime.   doxycycline 100 MG tablet Commonly  known as:  VIBRA-TABS Take 1 tablet (100 mg total) by mouth 2 (two) times daily for 7 days.   hydrochlorothiazide 50 MG tablet Commonly known as:  HYDRODIURIL Take 1 tablet (50 mg total) by mouth daily.   lisinopril 20 MG tablet Commonly known as:  PRINIVIL,ZESTRIL Take 1 tablet (20 mg total) by mouth daily.   oxyCODONE 5 MG immediate release tablet Commonly known as:  Oxy  IR/ROXICODONE Take 1 tablet (5 mg total) by mouth every 6 (six) hours as needed for breakthrough pain.   predniSONE 20 MG tablet Commonly known as:  DELTASONE Take 2 tablets (40 mg total) by mouth daily.   tiZANidine 2 MG tablet Commonly known as:  ZANAFLEX Take 1 tablet (2 mg total) by mouth every 8 (eight) hours as needed for muscle spasms.      Follow-up Information    Tonette Bihari, MD. Schedule an appointment as soon as possible for a visit in 1 week(s).   Specialty:  Family Medicine Contact information: [place] [address] [phone]        Shona Needles, MD. Schedule an appointment as soon as possible for a visit in 1 week(s).   Specialty:  Orthopedic Surgery Contact information: [address] [place] [address] [phone]          Allergies  Allergen Reactions  . Vicodin [Hydrocodone-Acetaminophen] Itching and Rash    Consultations:  Orthopedic   Procedures/Studies: Dg Chest 2 View  Result Date: 11/29/2017 CLINICAL DATA:  Short of breath EXAM: CHEST - 2 VIEW COMPARISON:  04/09/2017 FINDINGS: Severe cardiac enlargement with progression from the prior study. Congestive heart failure with mild edema. No effusion or focal consolidation. IMPRESSION: Severe cardiac enlargement with congestive heart failure and mild edema. Electronically Signed   By: Franchot Gallo M.D.   On: 11/29/2017 15:27   Ct Head Wo Contrast  Result Date: 11/29/2017 CLINICAL DATA:  Per EMS pt ongoing n/v and lupus since discharge. Unable to take blood pressure medications related to suchPt. C/o headache EXAM: CT HEAD WITHOUT CONTRAST TECHNIQUE: Contiguous axial images were obtained from the base of the skull through the vertex without intravenous contrast. COMPARISON:  09/14/2017 FINDINGS: Brain: No evidence of acute infarction, hemorrhage, hydrocephalus, extra-axial collection or mass lesion/mass effect. Vascular: No hyperdense vessel or unexpected  calcification. Skull: Normal. Negative for fracture or focal lesion. Sinuses/Orbits: Globes and orbits are unremarkable. Visualized sinuses and mastoid air cells are clear. Other: None. IMPRESSION: Normal unenhanced CT scan of the brain. Electronically Signed   By: Lajean Manes M.D.   On: 11/29/2017 15:48   Ct Abdomen Pelvis W Contrast  Result Date: 11/29/2017 CLINICAL DATA:  Nausea and vomiting for several days with abdominal pain EXAM: CT ABDOMEN AND PELVIS WITH CONTRAST TECHNIQUE: Multidetector CT imaging of the abdomen and pelvis was performed using the standard protocol following bolus administration of intravenous contrast. CONTRAST:  164mL ISOVUE-300 IOPAMIDOL (ISOVUE-300) INJECTION 61% COMPARISON:  None. FINDINGS: Lower chest: Lung bases demonstrate left basilar atelectasis as well as some generalized increased density within the lungs consistent with the congestive changes seen on recent chest x-ray. Small pericardial effusion is noted. Cardiac enlargement is noted. Hepatobiliary: Liver is well visualized and within normal limits. The gallbladder is decompressed. Pancreas: Unremarkable. No pancreatic ductal dilatation or surrounding inflammatory changes. Spleen: Normal in size without focal abnormality. Adrenals/Urinary Tract: Adrenal glands are within normal limits. The kidneys demonstrate a normal enhancement pattern without evidence of calculi or obstructive  change. The bladder is well distended. Stomach/Bowel: No colonic abnormality is seen. The appendix is within normal limits. Multiple mildly dilated loops of small bowel are identified in the left mid abdomen and across the midline without obstructive change. Significant venous engorgement of the small bowel is noted. These changes are consistent with small bowel inflammation. No obstructive changes are noted. Vascular/Lymphatic: No significant vascular findings are present. No enlarged abdominal or pelvic lymph nodes. Reproductive: Uterus and  bilateral adnexa are unremarkable. Other: Free fluid is noted within the pelvis likely reactive to the inflammatory change in the small bowel. No free air is seen. Musculoskeletal: No acute bony abnormality is noted. IMPRESSION: Multiple mildly dilated loops of small bowel with thickened edematous walls consistent with underlying small bowel inflammation. No obstructive changes are seen. These changes may be related to the patient's given clinical history of lupus. Mild free fluid within the pelvis likely reactive in nature. No findings to suggest perforation are seen. Changes in the lung bases with left basilar atelectasis and changes of congestive failure. Small pericardial effusion. Electronically Signed   By: Inez Catalina M.D.   On: 11/29/2017 15:50     Subjective: Patient seen and examined at bedside.  No overnight fever, nausea, vomiting.  She wants to go home.  She does not complain of any more discharge from her knee  Discharge Exam: Vitals:   12/25/17 2111 12/26/17 0553  BP: (!) 152/118 (!) 140/99  Pulse: 77 67  Resp: 17   Temp: 97.8 F (36.6 C) 97.7 F (36.5 C)  SpO2: 100% 98%   Vitals:   12/25/17 1802 12/25/17 1803 12/25/17 2111 12/26/17 0553  BP: (!) 151/108 (!) 151/108 (!) 152/118 (!) 140/99  Pulse: 91  77 67  Resp:   17   Temp:   97.8 F (36.6 C) 97.7 F (36.5 C)  TempSrc:   Oral Oral  SpO2:   100% 98%  Weight:      Height:        General: Pt is alert, awake, not in acute distress Cardiovascular: Rate controlled, S1/S2 + Respiratory: Bilateral decreased breath sounds at bases  abdominal: Soft, NT, ND, bowel sounds + Extremities: Right knee is tender to palpation with no discharge and no fluctuance, no cyanosis    The results of significant diagnostics from this hospitalization (including imaging, microbiology, ancillary and laboratory) are listed below for reference.     Microbiology: Recent Results (from the past 240 hour(s))  Culture, blood (Routine X 2) w  Reflex to ID Panel     Status: None (Preliminary result)   Collection Time: 12/24/17  1:54 PM  Result Value Ref Range Status   Specimen Description   Final    BLOOD RIGHT ARM Performed at [facility] [address] [place], [place] [address]    Special Requests   Final    BOTTLES DRAWN AEROBIC AND ANAEROBIC Blood Culture adequate volume Performed at [facility] [address] [place], [place] [address]    Culture   Final    NO GROWTH < 24 HOURS Performed at [facility] [address], [place], [place] [address]    Report Status PENDING  Incomplete  Culture, blood (Routine X 2) w Reflex to ID Panel     Status: None (Preliminary result)   Collection Time: 12/24/17  1:55 PM  Result Value Ref Range Status   Specimen Description   Final    BLOOD LEFT ANTECUBITAL Performed at [facility]  [place] [address] [place], [place] [address]    Special Requests   Final    BOTTLES DRAWN AEROBIC ONLY Blood Culture results may not be optimal due to an inadequate volume of blood received in culture bottles Performed at [facility] [address], [place], [place] [address]    Culture   Final    NO GROWTH < 24 HOURS Performed at [facility] [address], [place], [place] [address]    Report Status PENDING  Incomplete  Wound or Superficial Culture     Status: None (Preliminary result)   Collection Time: 12/24/17  3:03 PM  Result Value Ref Range Status   Specimen Description   Final    KNEE Performed at [facility] [address] [place], [place] [address]    Special Requests   Final    NONE Performed at [facility], [place] [address] [place], [place] [address]    Gram Stain   Final    ABUNDANT WBC PRESENT, PREDOMINANTLY PMN RARE GRAM POSITIVE COCCI    Culture   Final    CULTURE REINCUBATED FOR BETTER GROWTH Performed at [facility], [place] [address] [place], [place] [address]    Report Status PENDING  Incomplete     Labs: BNP (last 3 results) Recent Labs    11/29/17 1324  BNP 2,585.2*   Basic Metabolic Panel: Recent Labs  Lab 12/24/17 1355 12/25/17 0527 12/26/17 0729  NA 140 138 137  K 3.5 3.9 3.7  CL 107 103 103  CO2 20* 20* 23  GLUCOSE 75 121* 98  BUN 9 9 12   CREATININE 1.26* 1.19* 1.37*  CALCIUM 9.1 9.2 9.1  MG  --   --  1.6*   Liver Function Tests: Recent Labs  Lab 12/24/17 1355 12/26/17 0729  AST 16 13*  ALT 9* 7*  ALKPHOS 62 62  BILITOT 1.4* 0.5  PROT 7.4 6.9  ALBUMIN 3.6 3.1*   No results for input(s): LIPASE, AMYLASE in the last 168 hours. No results for input(s): AMMONIA in the last 168 hours. CBC: Recent Labs  Lab 12/24/17 1355 12/25/17 0527 12/26/17 0729  WBC 13.8* 9.4 18.8*  NEUTROABS 7.9*  --  13.1*  HGB 10.9* 13.5 12.5  HCT 34.2* 41.1 37.9  MCV 99.1 98.8 98.4  PLT 149* 149* 195   Cardiac Enzymes: No results for input(s): CKTOTAL, CKMB, CKMBINDEX, TROPONINI in the last 168 hours. BNP: Invalid input(s): POCBNP CBG: No results for input(s): GLUCAP in the last 168 hours. D-Dimer No results for input(s): DDIMER in the last 72 hours. Hgb A1c No results for input(s): HGBA1C in the last 72 hours. Lipid Profile No results for input(s): CHOL, HDL, LDLCALC, TRIG, CHOLHDL, LDLDIRECT in the last 72 hours. Thyroid function studies No results for input(s): TSH, T4TOTAL, T3FREE, THYROIDAB in the last 72 hours.  Invalid input(s): FREET3 Anemia work up No results for input(s): VITAMINB12, FOLATE, FERRITIN, TIBC, IRON, RETICCTPCT in the last 72 hours. Urinalysis    Component Value Date/Time   COLORURINE YELLOW 12/24/2017 1637   APPEARANCEUR HAZY (A) 12/24/2017 1637   LABSPEC 1.015 12/24/2017 1637   PHURINE 5.0 12/24/2017 1637   GLUCOSEU NEGATIVE 12/24/2017 1637   HGBUR SMALL (A) 12/24/2017 1637   BILIRUBINUR NEGATIVE 12/24/2017 1637   KETONESUR NEGATIVE 12/24/2017 1637    PROTEINUR 100 (A) 12/24/2017 1637   UROBILINOGEN 1.0 03/30/2015 2328   NITRITE NEGATIVE 12/24/2017 1637  LEUKOCYTESUR SMALL (A) 12/24/2017 1637   Sepsis Labs Invalid input(s): PROCALCITONIN,  WBC,  LACTICIDVEN Microbiology Recent Results (from the past 240 hour(s))  Culture, blood (Routine X 2) w Reflex to ID Panel     Status: None (Preliminary result)   Collection Time: 12/24/17  1:54 PM  Result Value Ref Range Status   Specimen Description   Final    BLOOD RIGHT ARM Performed at [facility] [address], [place], [place] [address]    Special Requests   Final    BOTTLES DRAWN AEROBIC AND ANAEROBIC Blood Culture adequate volume Performed at [facility] [address] [place], [place] [address]    Culture   Final    NO GROWTH < 24 HOURS Performed at [facility] [address], [place], [place] [address]    Report Status PENDING  Incomplete  Culture, blood (Routine X 2) w Reflex to ID Panel     Status: None (Preliminary result)   Collection Time: 12/24/17  1:55 PM  Result Value Ref Range Status   Specimen Description   Final    BLOOD LEFT ANTECUBITAL Performed at [facility] [address] [place], [place] [address]    Special Requests   Final    BOTTLES DRAWN AEROBIC ONLY Blood Culture results may not be optimal due to an inadequate volume of blood received in culture bottles Performed at [facility] [address], [place], [place] [address]    Culture   Final    NO GROWTH < 24 HOURS Performed at [facility] [address] [place], [place] [address]    Report Status PENDING  Incomplete  Wound or Superficial Culture     Status: None (Preliminary result)   Collection Time: 12/24/17  3:03 PM  Result Value Ref Range Status   Specimen Description   Final    KNEE Performed at [facility] [address] [place], [place] [address]    Special Requests    Final    NONE Performed at [facility], [place] [address], [place], [place] [address]    Gram Stain   Final    ABUNDANT WBC PRESENT, PREDOMINANTLY PMN RARE GRAM POSITIVE COCCI    Culture   Final    CULTURE REINCUBATED FOR BETTER GROWTH Performed at [facility], [place] [address], [place], [place] [address]    Report Status PENDING  Incomplete     Time coordinating discharge: 35 minutes  SIGNED:   Aline August, MD  [facility] 12/26/2017, 10:37 AM Pager: [phone]  If 7PM-7AM, please contact night-coverage [email or web address] Password [email or web address]

## 2017-12-27 LAB — AEROBIC CULTURE W GRAM STAIN (SUPERFICIAL SPECIMEN)

## 2017-12-29 LAB — CULTURE, BLOOD (ROUTINE X 2)
Culture: NO GROWTH
Culture: NO GROWTH
Special Requests: ADEQUATE

## 2018-01-02 ENCOUNTER — Encounter (HOSPITAL_COMMUNITY): Payer: Self-pay | Admitting: Emergency Medicine

## 2018-01-02 ENCOUNTER — Inpatient Hospital Stay (HOSPITAL_COMMUNITY)
Admission: EM | Admit: 2018-01-02 | Discharge: 2018-01-03 | DRG: 546 | Disposition: A | Discharge status: Home / Self Care | Payer: Medicaid Other | Attending: Family Medicine | Admitting: Family Medicine

## 2018-01-02 ENCOUNTER — Other Ambulatory Visit: Payer: Self-pay

## 2018-01-02 DIAGNOSIS — I10 Essential (primary) hypertension: Secondary | ICD-10-CM | POA: Diagnosis present

## 2018-01-02 DIAGNOSIS — M351 Other overlap syndromes: Secondary | ICD-10-CM | POA: Diagnosis present

## 2018-01-02 DIAGNOSIS — F17210 Nicotine dependence, cigarettes, uncomplicated: Secondary | ICD-10-CM | POA: Diagnosis present

## 2018-01-02 DIAGNOSIS — R1310 Dysphagia, unspecified: Secondary | ICD-10-CM | POA: Diagnosis present

## 2018-01-02 DIAGNOSIS — Z7952 Long term (current) use of systemic steroids: Secondary | ICD-10-CM

## 2018-01-02 DIAGNOSIS — N185 Chronic kidney disease, stage 5: Secondary | ICD-10-CM | POA: Diagnosis present

## 2018-01-02 DIAGNOSIS — T783XXA Angioneurotic edema, initial encounter: Secondary | ICD-10-CM | POA: Diagnosis not present

## 2018-01-02 DIAGNOSIS — N179 Acute kidney failure, unspecified: Secondary | ICD-10-CM | POA: Diagnosis present

## 2018-01-02 DIAGNOSIS — T783XXS Angioneurotic edema, sequela: Secondary | ICD-10-CM | POA: Diagnosis not present

## 2018-01-02 DIAGNOSIS — M3214 Glomerular disease in systemic lupus erythematosus: Secondary | ICD-10-CM | POA: Diagnosis present

## 2018-01-02 DIAGNOSIS — R000 Tachycardia, unspecified: Secondary | ICD-10-CM | POA: Diagnosis present

## 2018-01-02 DIAGNOSIS — T464X5A Adverse effect of angiotensin-converting-enzyme inhibitors, initial encounter: Secondary | ICD-10-CM | POA: Diagnosis present

## 2018-01-02 DIAGNOSIS — L03115 Cellulitis of right lower limb: Secondary | ICD-10-CM | POA: Diagnosis present

## 2018-01-02 DIAGNOSIS — Z885 Allergy status to narcotic agent status: Secondary | ICD-10-CM

## 2018-01-02 DIAGNOSIS — M25461 Effusion, right knee: Secondary | ICD-10-CM | POA: Diagnosis present

## 2018-01-02 DIAGNOSIS — L930 Discoid lupus erythematosus: Secondary | ICD-10-CM | POA: Diagnosis not present

## 2018-01-02 DIAGNOSIS — Z79899 Other long term (current) drug therapy: Secondary | ICD-10-CM

## 2018-01-02 DIAGNOSIS — M25462 Effusion, left knee: Secondary | ICD-10-CM

## 2018-01-02 DIAGNOSIS — N182 Chronic kidney disease, stage 2 (mild): Secondary | ICD-10-CM

## 2018-01-02 DIAGNOSIS — M329 Systemic lupus erythematosus, unspecified: Secondary | ICD-10-CM | POA: Diagnosis present

## 2018-01-02 HISTORY — DX: Essential (primary) hypertension: I10

## 2018-01-02 HISTORY — DX: Acute kidney failure, unspecified: N17.9

## 2018-01-02 HISTORY — DX: Angioneurotic edema, initial encounter: T78.3XXA

## 2018-01-02 LAB — CBC WITH DIFFERENTIAL/PLATELET
Basophils Absolute: 0 10*3/uL (ref 0.0–0.1)
Basophils Relative: 0 %
Eosinophils Absolute: 0 10*3/uL (ref 0.0–0.7)
Eosinophils Relative: 0 %
HCT: 40.3 % (ref 36.0–46.0)
Hemoglobin: 13.4 g/dL (ref 12.0–15.0)
Lymphocytes Relative: 25 %
Lymphs Abs: 3.9 10*3/uL (ref 0.7–4.0)
MCH: 32.2 pg (ref 26.0–34.0)
MCHC: 33.3 g/dL (ref 30.0–36.0)
MCV: 96.9 fL (ref 78.0–100.0)
Monocytes Absolute: 3.1 10*3/uL — ABNORMAL HIGH (ref 0.1–1.0)
Monocytes Relative: 20 %
Neutro Abs: 8.4 10*3/uL — ABNORMAL HIGH (ref 1.7–7.7)
Neutrophils Relative %: 55 %
Platelets: 172 10*3/uL (ref 150–400)
RBC: 4.16 MIL/uL (ref 3.87–5.11)
RDW: 15.3 % (ref 11.5–15.5)
WBC: 15.4 10*3/uL — ABNORMAL HIGH (ref 4.0–10.5)

## 2018-01-02 LAB — BASIC METABOLIC PANEL
Anion gap: 14 (ref 5–15)
BUN: 16 mg/dL (ref 6–20)
CO2: 22 mmol/L (ref 22–32)
Calcium: 9 mg/dL (ref 8.9–10.3)
Chloride: 96 mmol/L — ABNORMAL LOW (ref 101–111)
Creatinine, Ser: 1.52 mg/dL — ABNORMAL HIGH (ref 0.44–1.00)
GFR calc Af Amer: 55 mL/min — ABNORMAL LOW (ref >60)
GFR calc non Af Amer: 48 mL/min — ABNORMAL LOW (ref >60)
Glucose, Bld: 92 mg/dL (ref 65–99)
Potassium: 3.6 mmol/L (ref 3.5–5.1)
Sodium: 132 mmol/L — ABNORMAL LOW (ref 135–145)

## 2018-01-02 LAB — MRSA PCR SCREENING: MRSA by PCR: NEGATIVE

## 2018-01-02 LAB — I-STAT CG4 LACTIC ACID, ED: Lactic Acid, Venous: 1.31 mmol/L (ref 0.5–1.9)

## 2018-01-02 LAB — TROPONIN I: Troponin I: 0.05 ng/mL (ref <0.03)

## 2018-01-02 MED ORDER — METOPROLOL TARTRATE 5 MG/5ML IV SOLN
10.0000 mg | Freq: Four times a day (QID) | INTRAVENOUS | Status: DC
  Administered 2018-01-02 – 2018-01-03 (×4): 10 mg via INTRAVENOUS
  Filled 2018-01-02 (×4): qty 10

## 2018-01-02 MED ORDER — SODIUM CHLORIDE 0.9 % IV SOLN
INTRAVENOUS | Status: DC
  Administered 2018-01-03: 06:00:00 via INTRAVENOUS

## 2018-01-02 MED ORDER — METHYLPREDNISOLONE SODIUM SUCC 125 MG IJ SOLR
125.0000 mg | Freq: Once | INTRAMUSCULAR | Status: AC
  Administered 2018-01-02: 125 mg via INTRAVENOUS
  Filled 2018-01-02: qty 2

## 2018-01-02 MED ORDER — ENOXAPARIN SODIUM 40 MG/0.4ML ~~LOC~~ SOLN
40.0000 mg | SUBCUTANEOUS | Status: DC
  Administered 2018-01-02 – 2018-01-03 (×2): 40 mg via SUBCUTANEOUS
  Filled 2018-01-02 (×2): qty 0.4

## 2018-01-02 MED ORDER — FAMOTIDINE IN NACL 20-0.9 MG/50ML-% IV SOLN
20.0000 mg | Freq: Once | INTRAVENOUS | Status: AC
  Administered 2018-01-02: 20 mg via INTRAVENOUS
  Filled 2018-01-02: qty 50

## 2018-01-02 MED ORDER — METHYLPREDNISOLONE SODIUM SUCC 125 MG IJ SOLR
60.0000 mg | Freq: Four times a day (QID) | INTRAMUSCULAR | Status: DC
Start: 2018-01-02 — End: 2018-01-03
  Administered 2018-01-02 – 2018-01-03 (×5): 60 mg via INTRAVENOUS
  Filled 2018-01-02 (×5): qty 2

## 2018-01-02 MED ORDER — FLUCONAZOLE 150 MG PO TABS
150.0000 mg | ORAL_TABLET | Freq: Once | ORAL | Status: AC
  Administered 2018-01-02: 150 mg via ORAL
  Filled 2018-01-02: qty 1

## 2018-01-02 MED ORDER — ONDANSETRON HCL 4 MG PO TABS: 4.0000 mg | ORAL_TABLET | Freq: Four times a day (QID) | ORAL | Status: DC | PRN

## 2018-01-02 MED ORDER — DIPHENHYDRAMINE HCL 50 MG/ML IJ SOLN
25.0000 mg | Freq: Three times a day (TID) | INTRAMUSCULAR | Status: DC | PRN
  Administered 2018-01-02 – 2018-01-03 (×2): 25 mg via INTRAVENOUS
  Filled 2018-01-02 (×2): qty 1

## 2018-01-02 MED ORDER — DIPHENHYDRAMINE HCL 50 MG/ML IJ SOLN
25.0000 mg | Freq: Once | INTRAMUSCULAR | Status: AC
  Administered 2018-01-02: 25 mg via INTRAVENOUS
  Filled 2018-01-02: qty 1

## 2018-01-02 MED ORDER — ONDANSETRON HCL 4 MG/2ML IJ SOLN
4.0000 mg | Freq: Once | INTRAMUSCULAR | Status: AC
  Administered 2018-01-02: 4 mg via INTRAVENOUS
  Filled 2018-01-02: qty 2

## 2018-01-02 MED ORDER — FENTANYL CITRATE (PF) 100 MCG/2ML IJ SOLN
12.5000 ug | INTRAMUSCULAR | Status: DC | PRN
  Administered 2018-01-02 – 2018-01-03 (×8): 12.5 ug via INTRAVENOUS
  Filled 2018-01-02 (×8): qty 2

## 2018-01-02 MED ORDER — OXYMETAZOLINE HCL 0.05 % NA SOLN
1.0000 | Freq: Once | NASAL | Status: AC
  Administered 2018-01-02: 1 via NASAL
  Filled 2018-01-02: qty 15

## 2018-01-02 MED ORDER — ONDANSETRON HCL 4 MG/2ML IJ SOLN: 4.0000 mg | Freq: Four times a day (QID) | INTRAMUSCULAR | Status: DC | PRN

## 2018-01-02 MED ORDER — HYDRALAZINE HCL 20 MG/ML IJ SOLN
10.0000 mg | INTRAMUSCULAR | Status: DC | PRN
  Administered 2018-01-02: 10 mg via INTRAVENOUS
  Filled 2018-01-02: qty 1

## 2018-01-02 MED ORDER — FAMOTIDINE IN NACL 20-0.9 MG/50ML-% IV SOLN
20.0000 mg | Freq: Two times a day (BID) | INTRAVENOUS | Status: DC
  Administered 2018-01-02 – 2018-01-03 (×2): 20 mg via INTRAVENOUS
  Filled 2018-01-02 (×2): qty 50

## 2018-01-02 MED ORDER — FENTANYL CITRATE (PF) 100 MCG/2ML IJ SOLN
50.0000 ug | Freq: Once | INTRAMUSCULAR | Status: AC
Start: 2018-01-02 — End: 2018-01-02
  Administered 2018-01-02: 50 ug via INTRAVENOUS
  Filled 2018-01-02: qty 2

## 2018-01-02 MED ORDER — HYDRALAZINE HCL 20 MG/ML IJ SOLN: 5.0000 mg | INTRAMUSCULAR | Status: DC | PRN

## 2018-01-02 MED ORDER — METOPROLOL TARTRATE 5 MG/5ML IV SOLN
5.0000 mg | Freq: Four times a day (QID) | INTRAVENOUS | Status: DC
  Administered 2018-01-02: 5 mg via INTRAVENOUS
  Filled 2018-01-02: qty 5

## 2018-01-02 NOTE — ED Notes (Signed)
As I write this, Dr. Melene Plan has just finished his flexible laryngoscope exam. Pt. Remains in no distress. She tells Dt. Melene Plan that her level of swelling has not changed since her arrival to E.D. (No better/no worse). She is having no difficulty with her air-exchange.

## 2018-01-02 NOTE — H&P (Signed)
History and Physical    TRENIYA LOBB FAO:[id number] DOB: 16-May-1996 DOA: 01/02/2018  PCP: Tonette Bihari, MD Patient coming from: home  Chief Complaint: tongue swelling/difficulty breathing and swallowing  HPI: Linda [name] is a 22 y.o. female with medical history significant for lupus on prednisone, hypertension, recent hospitalization for the lightest of the right knee presents to the emergency department with a chief complaint of tongue swelling and difficulty  Initial evaluation reveals angioedema. [facility] are asked to ad  Information is obtained from the  Patient. She reports 2 days ago she developed swelling in her tongue. She states she has been on lisinopr but here in the last month has been more compliant with her medications She also states she had some strawberries and thought maybe she was having anallergic reaction in spite of never having these symptoms before. This morning she awakened and was having difficulty managing own secretions and she reports pain when she tries to swallow. She denies fever chills cough headache. She reports decreased oral intake last 2 days. She denies abdominal pain nausea  Or vomiting. She denies chest pain palpitations lower extremity edema. She denies dysuria hematuria frequency or urgency. She denies diarrhea constipation.    ED Course: in the emergency department she is afebrile hypertensive tachycardia with mild tachypnea but she is not hypoxic, she is provided benadryl Pepcid, solumedrol. EMS provided epi pen  Review of Systems: As per HPI otherwise all other systems reviewed and are negative.   Ambulatory Status: ambulates independantly  Past Medical History:  Diagnosis Date  . Acute kidney injury ([place])   . Angioedema   . Hypertension   . Lupus ([place])   . Migraines   . Mixed connective tissue disease [facility])     Past Surgical History:  Procedure Laterality Date  . I&D EXTREMITY Right 08/06/2017   Procedure:  IRRIGATION AND DEBRIDEMENT KNEE;  Surgeon: Shona Needles, MD;  Location: [facility];  Service: Orthopedics;  Laterality: Right;  . MULTIPLE TOOTH EXTRACTIONS      Social History   Socioeconomic History  . Marital status: Single    Spouse name: Not on file  . Number of children: Not on file  . Years of education: Not on file  . Highest education level: Not on file  Occupational History  . Not on file  Social Needs  . Financial resource strain: Not on file  . Food insecurity:    Worry: Not on file    Inability: Not on file  . Transportation needs:    Medical: Not on file    Non-medical: Not on file  Tobacco Use  . Smoking status: Current Every Day Smoker    Packs/day: 0.00    Types: Cigarettes, Cigars    Start date: 02/11/2015  . Smokeless tobacco: Never Used  Substance and Sexual Activity  . Alcohol use: No    Alcohol/week: 0.0 oz    Comment: social  . Drug use: No  . Sexual activity: Yes    Partners: Male    Birth control/protection: Implant  Lifestyle  . Physical activity:    Days per week: Not on file    Minutes per session: Not on file  . Stress: Not on file  Relationships  . Social connections:    Talks on phone: Not on file    Gets together: Not on file    Attends religious service: Not on file    Active member of club or organization: Not on file  Attends meetings of clubs or organizations: Not on file    Relationship status: Not on file  . Intimate partner violence:    Fear of current or ex partner: Not on file    Emotionally abused: Not on file    Physically abused: Not on file    Forced sexual activity: Not on file  Other Topics Concern  . Not on file  Social History Narrative   Lives with boyfriend    Allergies  Allergen Reactions  . Vicodin [Hydrocodone-Acetaminophen] Itching and Rash    Family History  Problem Relation Age of Onset  . Lupus Mother   . Hypertension Mother   . Kidney disease Mother   . Diabetes Maternal Grandmother      Prior to Admission medications   Medication Sig Start Date End Date Taking? Authorizing Provider  amLODipine (NORVASC) 10 MG tablet Take 1 tablet (10 mg total) by mouth daily. 11/25/17  Yes Rosita Fire, MD  dicyclomine (BENTYL) 10 MG capsule Take 1 capsule (10 mg total) by mouth 4 (four) times daily -  before meals and at bedtime. 12/04/17  Yes Purohit, Konrad Dolores, MD  doxycycline (VIBRA-TABS) 100 MG tablet Take 1 tablet (100 mg total) by mouth 2 (two) times daily for 7 days. 12/26/17 01/02/18 Yes Aline August, MD  hydrochlorothiazide (HYDRODIURIL) 50 MG tablet Take 1 tablet (50 mg total) by mouth daily. 12/05/17  Yes Purohit, Konrad Dolores, MD  lisinopril (PRINIVIL,ZESTRIL) 20 MG tablet Take 1 tablet (20 mg total) by mouth daily. 12/05/17  Yes Purohit, Konrad Dolores, MD  oxyCODONE (OXY IR/ROXICODONE) 5 MG immediate release tablet Take 1 tablet (5 mg total) by mouth every 6 (six) hours as needed for breakthrough pain. 12/26/17  Yes Aline August, MD  predniSONE (DELTASONE) 20 MG tablet Take 2 tablets (40 mg total) by mouth daily. 12/04/17  Yes Purohit, Konrad Dolores, MD    Physical Exam: Vitals:   01/02/18 0715 01/02/18 0730 01/02/18 0745 01/02/18 0800  BP:  (!) 131/92  (!) 129/94  Pulse: 95 93 91 92  Resp: 16 12 14 17   Temp:      TempSrc:      SpO2: 99% 100% 100% 99%     General:  Appears calm and comfortable in no acute distress Eyes:  PERRL, EOMI, normal lids, iris ENT: tongue swollen particularly on right. No lesions. Spitting secretions Neck:  no LAD, masses or thyromegaly Cardiovascular:  Tachycardia but regular, no m/r/g. No LE edema.  Respiratory:  CTA bilaterally, no w/r/r. Normal respiratory effort.no stridor Abdomen:  soft, ntnd, obese soft +BS Skin:  no rash or induration seen on limited exam Musculoskeletal:  grossly normal tone BUE/BLE, good ROM, no bony abnormality Psychiatric:  grossly normal mood and affect, speech fluent and appropriate, AOx3 Neurologic:  CN 2-12 grossly intact,  moves all extremities in coordinated fashion, sensation intact  Labs on Admission: I have personally reviewed following labs and imaging studies  CBC: Recent Labs  Lab 01/02/18 0605  WBC 15.4*  NEUTROABS PENDING  HGB 13.4  HCT 40.3  MCV 96.9  PLT 962   Basic Metabolic Panel: Recent Labs  Lab 01/02/18 0605  NA 132*  K 3.6  CL 96*  CO2 22  GLUCOSE 92  BUN 16  CREATININE 1.52*  CALCIUM 9.0   GFR: Estimated Creatinine Clearance: 67.4 mL/min (A) (by C-G formula based on SCr of 1.52 mg/dL (H)). Liver Function Tests: No results for input(s): AST, ALT, ALKPHOS, BILITOT, PROT, ALBUMIN in the last 168  hours. No results for input(s): LIPASE, AMYLASE in the last 168 hours. No results for input(s): AMMONIA in the last 168 hours. Coagulation Profile: No results for input(s): INR, PROTIME in the last 168 hours. Cardiac Enzymes: No results for input(s): CKTOTAL, CKMB, CKMBINDEX, TROPONINI in the last 168 hours. BNP (last 3 results) No results for input(s): PROBNP in the last 8760 hours. HbA1C: No results for input(s): HGBA1C in the last 72 hours. CBG: No results for input(s): GLUCAP in the last 168 hours. Lipid Profile: No results for input(s): CHOL, HDL, LDLCALC, TRIG, CHOLHDL, LDLDIRECT in the last 72 hours. Thyroid Function Tests: No results for input(s): TSH, T4TOTAL, FREET4, T3FREE, THYROIDAB in the last 72 hours. Anemia Panel: No results for input(s): VITAMINB12, FOLATE, FERRITIN, TIBC, IRON, RETICCTPCT in the last 72 hours. Urine analysis:    Component Value Date/Time   COLORURINE YELLOW 12/24/2017 1637   APPEARANCEUR HAZY (A) 12/24/2017 1637   LABSPEC 1.015 12/24/2017 1637   PHURINE 5.0 12/24/2017 1637   GLUCOSEU NEGATIVE 12/24/2017 1637   HGBUR SMALL (A) 12/24/2017 1637   BILIRUBINUR NEGATIVE 12/24/2017 1637   KETONESUR NEGATIVE 12/24/2017 1637   PROTEINUR 100 (A) 12/24/2017 1637   UROBILINOGEN 1.0 03/30/2015 2328   NITRITE NEGATIVE 12/24/2017 1637    LEUKOCYTESUR SMALL (A) 12/24/2017 1637    Creatinine Clearance: Estimated Creatinine Clearance: 67.4 mL/min (A) (by C-G formula based on SCr of 1.52 mg/dL (H)).  Sepsis Labs: @LABRCNTIP (procalcitonin:4,lacticidven:4) ) Recent Results (from the past 240 hour(s))  Culture, blood (Routine X 2) w Reflex to ID Panel     Status: None   Collection Time: 12/24/17  1:54 PM  Result Value Ref Range Status   Specimen Description   Final    BLOOD RIGHT ARM Performed at [facility] [address] [place], [place] [address]    Special Requests   Final    BOTTLES DRAWN AEROBIC AND ANAEROBIC Blood Culture adequate volume Performed at [facility] [address] [place], [place] [address]    Culture   Final    NO GROWTH 5 DAYS Performed at [facility], [place] [address], [place], [place] [address]    Report Status 12/29/2017 FINAL  Final  Culture, blood (Routine X 2) w Reflex to ID Panel     Status: None   Collection Time: 12/24/17  1:55 PM  Result Value Ref Range Status   Specimen Description   Final    BLOOD LEFT ANTECUBITAL Performed at [facility] [address] [place], [place] [address]    Special Requests   Final    BOTTLES DRAWN AEROBIC ONLY Blood Culture results may not be optimal due to an inadequate volume of blood received in culture bottles Performed at [facility] [address], [place], [place] [address]    Culture   Final    NO GROWTH 5 DAYS Performed at [facility], [place] [address] [place], [place] [address]    Report Status 12/29/2017 FINAL  Final  Wound or Superficial Culture     Status: None   Collection Time: 12/24/17  3:03 PM  Result Value Ref Range Status   Specimen Description   Final    KNEE Performed at [facility] [address], [place], [place] [address]    Special Requests   Final    NONE Performed at [facility], [place] [address], [place], [place] [address]    Gram Stain  Final    ABUNDANT WBC PRESENT, PREDOMINANTLY PMN RARE GRAM POSITIVE COCCI Performed at [facility], [place] [address], [place], [place] [address]    Culture   Final    FEW GROUP B STREP(S.AGALACTIAE)ISOLATED TESTING AGAINST S. AGALACTIAE NOT ROUTINELY PERFORMED DUE TO PREDICTABILITY OF AMP/PEN/VAN SUSCEPTIBILITY. FEW STAPHYLOCOCCUS AUREUS    Report Status 12/27/2017 FINAL  Final   Organism ID, Bacteria STAPHYLOCOCCUS AUREUS  Final      Susceptibility   Staphylococcus aureus - MIC*    CIPROFLOXACIN <=0.5 SENSITIVE Sensitive     ERYTHROMYCIN <=0.25 SENSITIVE Sensitive     GENTAMICIN <=0.5 SENSITIVE Sensitive     OXACILLIN <=0.25 SENSITIVE Sensitive     TETRACYCLINE <=1 SENSITIVE Sensitive     VANCOMYCIN 1 SENSITIVE Sensitive     TRIMETH/SULFA <=10 SENSITIVE Sensitive     CLINDAMYCIN <=0.25 SENSITIVE Sensitive     RIFAMPIN <=0.5 SENSITIVE Sensitive     Inducible Clindamycin NEGATIVE Sensitive     * FEW STAPHYLOCOCCUS AUREUS     Radiological Exams on Admission: No results found.  EKG:   Assessment/Plan Principal Problem:   Angioedema Active Problems:   Primary hypertension   Lupus erythematosus   Bilateral knee swelling   Acute kidney injury ([place])   Tachycardia   #1. Angioedema. Presumably from lisinopril. Difficulty swallowing. Not hypoxic. No re Evaluated by Dr Benjamine Mola with ENT who performed flexible laryngoscopy and opined edema localized to oral cavity with no laryngeal involvement and recommended admission for close observation and conservative measures -admit to stepdown -continue solumedrol and benadryl and pepcid - NPO -advance diet as tolerated -gentle IV fluids -supportive therapy -monitor closely  #2. Hypertension. Only fair control in ED. Home meds include lisinopril, norvasc and HCTZ.  -hold oral meds for now -discontinue lisinopril -prn hydralazine -metoprolol with parameters -monitor  #3.  Tachycardia. Mild. Likely related to above. Provided IV fluids in ED. No chest pain -monitor -metoprolol as noted above -gently IV fluids  #4.Acute kidney injury. Likely related to decreased oral intake. Creatinine 1.5.  -gentle IV fluids -hold nephrotoxins -monitor urine output -recheck in am  #5 Cellulitis of right knee. Recently discharged with oral antibiotics. Today last day. Knee improving -monitor  #7. Lupus. Home meds include prednisone -see above -OP folloow up with rheumatology    DVT prophylaxis: lovenox  Code Status: full  Family Communication: none  Disposition Plan: home  Consults called: teah ENT Admission status: inpateint    Radene Gunning MD [facility]  If 7PM-7AM, please contact night-coverage [email or web address] Password [facility]  01/02/2018, 8:36 AM

## 2018-01-02 NOTE — Progress Notes (Signed)
DR. Lanna Poche UPDATED TO PATIENT CONDITION INCLUDING CHANGES IN EKG (ST ELEVATION). NEW ORDERS RECEIVED. STAT EKG; VORBV

## 2018-01-02 NOTE — ED Notes (Signed)
She continues to have some tongue and mouth swelling; no different than before. She continues to breath easily.

## 2018-01-02 NOTE — ED Notes (Signed)
ED TO INPATIENT HANDOFF REPORT  Name/Age/Gender Linda [name] 22 y.o. female  Code Status    Code Status Orders  (From admission, onward)        Start     Ordered   01/02/18 0813  Full code  Continuous     01/02/18 0817    Code Status History    Date Active Date Inactive Code Status Order ID Comments User Context   12/24/2017 2005 12/26/2017 1901 Full Code [id number]  Velvet Bathe, MD Inpatient   11/29/2017 1740 12/04/2017 1719 Full Code [id number]  Shelly Coss, MD ED   11/22/2017 0955 11/25/2017 1422 Full Code [id number]  Patrecia Pour, MD Inpatient   08/02/2017 2005 08/09/2017 1753 Full Code [id number]  Steve Rattler, DO ED   04/20/2017 2033 04/22/2017 2105 Full Code [id number]  Carlyle Dolly, MD Inpatient   04/09/2017 1351 04/14/2017 2123 Full Code [id number]  Smiley Houseman, MD ED      Home/SNF/Other Home  Chief Complaint Angio-edema  Level of Care/Admitting Diagnosis ED Disposition    ED Disposition Condition Comment   [facility]: [facility] [[id number]]  Level of Care: Stepdown [14]  Admit to SDU based on following criteria: Other see comments  Comments: frequent assessment of ability to protect airway  Diagnosis: Angioedema [111552]  Admitting Physician: Patrecia Pour, EDWIN [[id number]]  Attending Physician: Patrecia Pour, EDWIN [[id number]]  Estimated length of stay: 3 - 4 days  Certification:: I certify this patient will need inpatient services for at least 2 midnights  PT Class (Do Not Modify): Inpatient [101]  PT Acc Code (Do Not Modify): Private [1]       Medical History Past Medical History:  Diagnosis Date  . Acute kidney injury ([place])   . Angioedema   . Hypertension   . Lupus ([place])   . Migraines   . Mixed connective tissue disease ([facility])     Allergies Allergies  Allergen Reactions  . Vicodin [Hydrocodone-Acetaminophen] Itching and Rash    IV Location/Drains/Wounds Patient Lines/Drains/Airways Status    Active Line/Drains/Airways    Name:   Placement date:   Placement time:   Site:   Days:   Peripheral IV 01/02/18 Left Antecubital   01/02/18    7530    Antecubital   less than 1   Wound / Incision (Open or Dehisced) 12/24/17 Other (Comment) Knee Anterior;Right abraised knee    12/24/17    2100    Knee   9          Labs/Imaging Results for orders placed or performed during the hospital encounter of 01/02/18 (from the past 48 hour(s))  CBC with Differential     Status: Abnormal   Collection Time: 01/02/18  6:05 AM  Result Value Ref Range   WBC 15.4 (H) 4.0 - 10.5 K/uL   RBC 4.16 3.87 - 5.11 MIL/uL   Hemoglobin 13.4 12.0 - 15.0 g/dL   HCT 40.3 36.0 - 46.0 %   MCV 96.9 78.0 - 100.0 fL   MCH 32.2 26.0 - 34.0 pg   MCHC 33.3 30.0 - 36.0 g/dL   RDW 15.3 11.5 - 15.5 %   Platelets 172 150 - 400 K/uL   Neutrophils Relative % 55 %   Lymphocytes Relative 25 %   Monocytes Relative 20 %   Eosinophils Relative 0 %   Basophils Relative 0 %   Neutro Abs 8.4 (H) 1.7 - 7.7 K/uL   Lymphs Abs 3.9 0.7 - 4.0  K/uL   Monocytes Absolute 3.1 (H) 0.1 - 1.0 K/uL   Eosinophils Absolute 0.0 0.0 - 0.7 K/uL   Basophils Absolute 0.0 0.0 - 0.1 K/uL   WBC Morphology ATYPICAL LYMPHOCYTES     Comment: Performed at [facility], [place] [address] [place], [place] [address]  Basic metabolic panel     Status: Abnormal   Collection Time: 01/02/18  6:05 AM  Result Value Ref Range   Sodium 132 (L) 135 - 145 mmol/L   Potassium 3.6 3.5 - 5.1 mmol/L   Chloride 96 (L) 101 - 111 mmol/L   CO2 22 22 - 32 mmol/L   Glucose, Bld 92 65 - 99 mg/dL   BUN 16 6 - 20 mg/dL   Creatinine, Ser 1.52 (H) 0.44 - 1.00 mg/dL   Calcium 9.0 8.9 - 10.3 mg/dL   GFR calc non Af Amer 48 (L) >60 mL/min   GFR calc Af Amer 55 (L) >60 mL/min    Comment: (NOTE) The eGFR has been calculated using the CKD EPI equation. This calculation has not been validated in all clinical situations. eGFR's persistently <60 mL/min signify  possible Chronic Kidney Disease.    Anion gap 14 5 - 15    Comment: Performed at [facility], [place] [address] [place] [address], [place], [place] [address]  I-Stat CG4 Lactic Acid, ED     Status: None   Collection Time: 01/02/18  8:09 AM  Result Value Ref Range   Lactic Acid, Venous 1.31 0.5 - 1.9 mmol/L   No results found.  Pending Labs Unresulted Labs (From admission, onward)   Start     Ordered   01/09/18 0500  Creatinine, serum  (enoxaparin (LOVENOX)    CrCl >/= 30 ml/min)  Weekly,   R    Comments:  while on enoxaparin therapy    01/02/18 0817   01/03/18 7915  Basic metabolic panel  Tomorrow morning,   R     01/02/18 0817   01/03/18 0500  CBC  Tomorrow morning,   R     01/02/18 0817   01/02/18 0629  Blood culture (routine x 2)  BLOOD CULTURE X 2,   STAT     01/02/18 0630      Vitals/Pain Today's Vitals   01/02/18 1230 01/02/18 1300 01/02/18 1330 01/02/18 1352  BP: (!) 146/107 (!) 150/113 (!) 155/124   Pulse: 65 64 67   Resp: _0 Temp:      TempSrc:      SpO2: 98% 99% 100%   PainSc:    8     Isolation Precautions No active isolations  Medications Medications  enoxaparin (LOVENOX) injection 40 mg (40 mg Subcutaneous Given 01/02/18 1046)  0.9 %  sodium chloride infusion ( Intravenous Transfusing/Transfer 01/02/18 1519)  ondansetron (ZOFRAN) tablet 4 mg (has no administration in time range)    Or  ondansetron (ZOFRAN) injection 4 mg (has no administration in time range)  fentaNYL (SUBLIMAZE) injection 12.5 mcg (12.5 mcg Intravenous Given 01/02/18 1351)  hydrALAZINE (APRESOLINE) injection 5 mg (has no administration in time range)  methylPREDNISolone sodium succinate (SOLU-MEDROL) 125 mg/2 mL injection 60 mg (60 mg Intravenous Given 01/02/18 1351)  metoprolol tartrate (LOPRESSOR) injection 5 mg (5 mg Intravenous Given 01/02/18 1204)  famotidine (PEPCID) IVPB 20 mg premix (20 mg Intravenous Not Given 01/02/18 1041)  diphenhydrAMINE (BENADRYL) injection 25 mg (has no  administration in time range)  methylPREDNISolone sodium succinate (SOLU-MEDROL) 125 mg/2 mL injection 125 mg (  125 mg Intravenous Given 01/02/18 0640)  famotidine (PEPCID) IVPB 20 mg premix (0 mg Intravenous Stopped 01/02/18 0739)  diphenhydrAMINE (BENADRYL) injection 25 mg (25 mg Intravenous Given 01/02/18 0640)  fentaNYL (SUBLIMAZE) injection 50 mcg (50 mcg Intravenous Given 01/02/18 0802)  ondansetron (ZOFRAN) injection 4 mg (4 mg Intravenous Given 01/02/18 0802)  oxymetazoline (AFRIN) 0.05 % nasal spray 1 spray (1 spray Each Nare Given by Other 01/02/18 0801)    Mobility walks

## 2018-01-02 NOTE — Progress Notes (Signed)
SBAR REPORT GIVEN TO Marylyn Ishihara, RN; CARE RELINQUISHED.

## 2018-01-02 NOTE — Consult Note (Signed)
Reason for Consult: Angioedema Referring Physician: Kirichenko, Tatyana, PA-C  HPI:  Linda [name] is an 21 y.o. female who presents to the [facility] emergency room today complaining of tongue and throat swelling. The patient states that her swelling began 2 days ago. She states that she woke up this morning with worsening swelling.  She takes lisinopril for her blood pressure.  She denies history of similar symptoms in the past.  She was given epi by EMS.  Patient states she is having difficulty swallowing including her own saliva.  She also states in addition to the tongue swelling she feels like her throat is swelling.    The patient has a history of lupus.  She has no previous history of ENT surgery.   Past Medical History:  Diagnosis Date  . Lupus (HCC)   . Migraines   . Mixed connective tissue disease (HCC)     Past Surgical History:  Procedure Laterality Date  . I&D EXTREMITY Right 08/06/2017   Procedure: IRRIGATION AND DEBRIDEMENT KNEE;  Surgeon: Haddix, Kevin P, MD;  Location: [facility] OR;  Service: Orthopedics;  Laterality: Right;  . MULTIPLE TOOTH EXTRACTIONS      Family History  Problem Relation Age of Onset  . Lupus Mother   . Hypertension Mother   . Kidney disease Mother   . Diabetes Maternal Grandmother     Social History:  reports that she has been smoking cigarettes and cigars.  She started smoking about 2 years ago. She has been smoking about 0.00 packs per day. She has never used smokeless tobacco. She reports that she does not drink alcohol or use drugs.  Allergies:  Allergies  Allergen Reactions  . Vicodin [Hydrocodone-Acetaminophen] Itching and Rash    Prior to Admission medications   Medication Sig Start Date End Date Taking? Authorizing Provider  amLODipine (NORVASC) 10 MG tablet Take 1 tablet (10 mg total) by mouth daily. 11/25/17   Bhandari, Dron Prasad, MD  dicyclomine (BENTYL) 10 MG capsule Take 1 capsule (10 mg total) by mouth 4 (four) times daily -   before meals and at bedtime. 12/04/17   Purohit, Shrey C, MD  doxycycline (VIBRA-TABS) 100 MG tablet Take 1 tablet (100 mg total) by mouth 2 (two) times daily for 7 days. 12/26/17 01/02/18  Alekh, Kshitiz, MD  hydrochlorothiazide (HYDRODIURIL) 50 MG tablet Take 1 tablet (50 mg total) by mouth daily. 12/05/17   Purohit, Shrey C, MD  lisinopril (PRINIVIL,ZESTRIL) 20 MG tablet Take 1 tablet (20 mg total) by mouth daily. 12/05/17   Purohit, Shrey C, MD  oxyCODONE (OXY IR/ROXICODONE) 5 MG immediate release tablet Take 1 tablet (5 mg total) by mouth every 6 (six) hours as needed for breakthrough pain. 12/26/17   Alekh, Kshitiz, MD  predniSONE (DELTASONE) 20 MG tablet Take 2 tablets (40 mg total) by mouth daily. 12/04/17   Purohit, Shrey C, MD  tiZANidine (ZANAFLEX) 2 MG tablet Take 1 tablet (2 mg total) by mouth every 8 (eight) hours as needed for muscle spasms. 11/25/17   Bhandari, Dron Prasad, MD    Results for orders placed or performed during the hospital encounter of 01/02/18 (from the past 48 hour(s))  CBC with Differential     Status: Abnormal (Preliminary result)   Collection Time: 01/02/18  6:05 AM  Result Value Ref Range   WBC 15.4 (H) 4.0 - 10.5 K/uL   RBC 4.16 3.87 - 5.11 MIL/uL   Hemoglobin 13.4 12.0 - 15.0 g/dL   HCT 40.3 36.0 -   46.0 %   MCV 96.9 78.0 - 100.0 fL   MCH 32.2 26.0 - 34.0 pg   MCHC 33.3 30.0 - 36.0 g/dL   RDW 15.3 11.5 - 15.5 %   Platelets 172 150 - 400 K/uL    Comment: Performed at [facility], [place] [address] [place], [place] [address]   Neutrophils Relative % PENDING %   Neutro Abs PENDING 1.7 - 7.7 K/uL   Band Neutrophils PENDING %   Lymphocytes Relative PENDING %   Lymphs Abs PENDING 0.7 - 4.0 K/uL   Monocytes Relative PENDING %   Monocytes Absolute PENDING 0.1 - 1.0 K/uL   Eosinophils Relative PENDING %   Eosinophils Absolute PENDING 0.0 - 0.7 K/uL   Basophils Relative PENDING %   Basophils Absolute PENDING 0.0 - 0.1 K/uL   WBC Morphology PENDING     RBC Morphology PENDING    Smear Review PENDING    nRBC PENDING 0 /100 WBC   Metamyelocytes Relative PENDING %   Myelocytes PENDING %   Promyelocytes Relative PENDING %   Blasts PENDING %  Basic metabolic panel     Status: Abnormal   Collection Time: 01/02/18  6:05 AM  Result Value Ref Range   Sodium 132 (L) 135 - 145 mmol/L   Potassium 3.6 3.5 - 5.1 mmol/L   Chloride 96 (L) 101 - 111 mmol/L   CO2 22 22 - 32 mmol/L   Glucose, Bld 92 65 - 99 mg/dL   BUN 16 6 - 20 mg/dL   Creatinine, Ser 1.52 (H) 0.44 - 1.00 mg/dL   Calcium 9.0 8.9 - 10.3 mg/dL   GFR calc non Af Amer 48 (L) >60 mL/min   GFR calc Af Amer 55 (L) >60 mL/min    Comment: (NOTE) The eGFR has been calculated using the CKD EPI equation. This calculation has not been validated in all clinical situations. eGFR's persistently <60 mL/min signify possible Chronic Kidney Disease.    Anion gap 14 5 - 15    Comment: Performed at [facility], [place] [address] [place], [place] [address]    No results found.  Review of Systems  Constitutional: Negative for chills and fever.  HENT: Positive for facial swelling, sore throat and trouble swallowing.   Respiratory: Negative for cough, chest tightness and shortness of breath.   Cardiovascular: Negative for chest pain, palpitations and leg swelling.  Gastrointestinal: Negative for abdominal pain, diarrhea, nausea and vomiting.  Genitourinary: Negative for dysuria, flank pain and pelvic pain.  Musculoskeletal: Negative for arthralgias, myalgias, neck pain and neck stiffness.  Skin: Negative for rash.  Neurological: Negative for dizziness, weakness and headaches.  All other systems reviewed and are negative.  Blood pressure (!) 138/97, pulse (!) 107, temperature 100.1 F (37.8 C), temperature source Axillary, resp. rate 14, last menstrual period 12/29/2017, SpO2 97 %. Physical Exam  Constitutional: She is oriented to person, place, and time. She appears  well-developed and well-nourished.  Head: Normocephalic.  Ears: Normal auricles and external auditory canals. Nose: Normal mucosa over the nasal septum and turbinates.  Mouth: Moderate tongue edema.  The rest of the oral cavity and oral pharyngeal mucosa are normal. Eyes: Conjunctivae are normal.  Pupils are equal round and reactive to light. Neck: Neck supple.  No significant edema.  No lymphadenopathy or mass.   Cardiovascular: Normal rate, regular rhythm and normal heart sounds.  Pulmonary/Chest: Effort normal and breath sounds normal. No respiratory distress. She has no wheezes. She has  no rales. No stridor  Musculoskeletal: She exhibits no edema.  Neurological: She is alert and oriented to person, place, and time.  Skin: Skin is warm and dry.  Psychiatric: She has a normal mood and affect. Her behavior is normal.  Nursing note and vitals reviewed.  Procedure:  Flexible Fiberoptic Laryngoscopy Anesthesia: Topical oxymetazoline and lidocaine Indication: Persistent throat pain. Description: Risks, benefits, and alternatives of flexible endoscopy were explained to the patient. Specific mention was made of the risk of throat numbness with difficulty swallowing, possible bleeding from the nose and mouth, and pain from the procedure.  The patient gave oral consent to proceed.  The nasal cavities were decongested and anesthetised with a combination of oxymetazoline and 4% lidocaine solution.  The flexible scope was inserted into the right nasal cavity and advanced towards the nasopharynx.  Visualized mucosa over the turbinates and septum were normal.  The nasopharynx was clear.  Oropharyngeal walls were symmetric and mobile without lesion, mass, or edema.  Hypopharynx was also without  lesion or edema.  Larynx was mobile without lesions.  No lesions or asymmetry in the supraglottic larynx.  Arytenoid mucosa was normal.   True vocal folds were pale yellow and without mass or lesion.     Assessment/Plan: Angioedema secondary to the use of lisinopril.  The edema appears to be localized to the oral cavity.  No laryngeal involvement is noted on today's examination.  Discontinuing lisinopril.  Conservative observation for now.  The patient may follow-up in my office as needed.  Alaa Mullally W Gideon Burstein 01/02/2018, 7:50 AM

## 2018-01-02 NOTE — ED Notes (Signed)
I have just phoned report to Cecille Rubin, [place] in ICU. Will transport asap.

## 2018-01-02 NOTE — Progress Notes (Signed)
DR SIVIA UPDATED EKG COMPLETED. NEW ORDERS RECEIVED. TREND TROPONIN X 3. WHEN PT IS ABLE TO SWALLOW; INCREASE DIET TO CLEAR LIQUIDS. VORBV

## 2018-01-02 NOTE — ED Notes (Signed)
She states her tongue "feels the same". She still spits out saliva occasionally and has visible edema of (especially the right side of) her tongue. She denies any feeling of tightening or swelling in throat and continues to breath easily.

## 2018-01-02 NOTE — ED Notes (Signed)
Bed: RESB Expected date:  Expected time:  Means of arrival:  Comments: EMS angioedema

## 2018-01-02 NOTE — ED Triage Notes (Signed)
Pt from home called EMS with  Difficulty swallowing breathing and talking d/t tongue swelling after taking her morning dose of Lisnopril . OraL SWELLING NOTED PER ems 0.3 Epi given left thigh .

## 2018-01-02 NOTE — ED Provider Notes (Signed)
[facility] DEPT Provider Note   CSN: [id number] Arrival date & time: 01/02/18  0547     History   Chief Complaint Chief Complaint  Patient presents with  . Oral Swelling    HPI Linda [name] is a 22 y.o. female.  HPI Linda [name] is a 22 y.o. female with history of lupus, presents to emergency department complaining of tongue and throat swelling.  This patient states that her swelling began 2 days ago.  She states she ate some strawberries and thought she had an allergic reaction to the strawberries.  She states that she woke up this morning with worsening swelling.  She does take lisinopril for her blood pressure.  She denies history of similar symptoms in the past.  She was given epi by EMS, unable to obtain IV, sent here for further evaluation.  Patient states she is having difficulty swallowing including her own saliva.  She also states in addition to the tongue swelling she feels like her throat is swelling.  She is also stating that she is having some difficulty breathing.  She reports pain in her throat.  Past Medical History:  Diagnosis Date  . Lupus ([place])   . Migraines   . Mixed connective tissue disease [facility])     Patient Active Problem List   Diagnosis Date Noted  . Hypertensive urgency 11/29/2017  . Enteritis 11/29/2017  . Nausea vomiting and diarrhea 11/29/2017  . Leucocytosis 11/29/2017  . Septic prepatellar bursitis of right knee 08/19/2017  . Grief reaction 08/19/2017  . Right knee pain   . Elbow pain, right   . Back pain 05/04/2017  . Bilateral knee swelling 04/20/2017  . History of connective tissue disease   . Leg swelling   . Abdominal pain   . Lupus erythematosus   . Cellulitis 04/09/2017  . Primary hypertension 12/11/2016  . Mixed connective tissue disease ([place]) 02/11/2016  . High BMI 01/19/2014  . Implanon in place 01/19/2014    Past Surgical History:  Procedure Laterality Date  . I&D EXTREMITY Right  08/06/2017   Procedure: IRRIGATION AND DEBRIDEMENT KNEE;  Surgeon: Shona Needles, MD;  Location: [facility];  Service: Orthopedics;  Laterality: Right;  . MULTIPLE TOOTH EXTRACTIONS       OB History    Gravida  0   Para  0   Term  0   Preterm  0   AB  0   Living  0     SAB  0   TAB  0   Ectopic  0   Multiple  0   Live Births               Home Medications    Prior to Admission medications   Medication Sig Start Date End Date Taking? Authorizing Provider  amLODipine (NORVASC) 10 MG tablet Take 1 tablet (10 mg total) by mouth daily. 11/25/17   Rosita Fire, MD  dicyclomine (BENTYL) 10 MG capsule Take 1 capsule (10 mg total) by mouth 4 (four) times daily -  before meals and at bedtime. 12/04/17   Purohit, Konrad Dolores, MD  doxycycline (VIBRA-TABS) 100 MG tablet Take 1 tablet (100 mg total) by mouth 2 (two) times daily for 7 days. 12/26/17 01/02/18  Aline August, MD  hydrochlorothiazide (HYDRODIURIL) 50 MG tablet Take 1 tablet (50 mg total) by mouth daily. 12/05/17   Purohit, Konrad Dolores, MD  lisinopril (PRINIVIL,ZESTRIL) 20 MG tablet Take 1 tablet (20 mg total) by  mouth daily. 12/05/17   Purohit, Konrad Dolores, MD  oxyCODONE (OXY IR/ROXICODONE) 5 MG immediate release tablet Take 1 tablet (5 mg total) by mouth every 6 (six) hours as needed for breakthrough pain. 12/26/17   Aline August, MD  predniSONE (DELTASONE) 20 MG tablet Take 2 tablets (40 mg total) by mouth daily. 12/04/17   Purohit, Konrad Dolores, MD  tiZANidine (ZANAFLEX) 2 MG tablet Take 1 tablet (2 mg total) by mouth every 8 (eight) hours as needed for muscle spasms. 11/25/17   Rosita Fire, MD    Family History Family History  Problem Relation Age of Onset  . Lupus Mother   . Hypertension Mother   . Kidney disease Mother   . Diabetes Maternal Grandmother     Social History Social History   Tobacco Use  . Smoking status: Current Every Day Smoker    Packs/day: 0.00    Types: Cigarettes, Cigars    Start date:  02/11/2015  . Smokeless tobacco: Never Used  Substance Use Topics  . Alcohol use: No    Alcohol/week: 0.0 oz    Comment: social  . Drug use: No     Allergies   Vicodin [hydrocodone-acetaminophen]   Review of Systems Review of Systems  Constitutional: Negative for chills and fever.  HENT: Positive for facial swelling, sore throat and trouble swallowing.   Respiratory: Negative for cough, chest tightness and shortness of breath.   Cardiovascular: Negative for chest pain, palpitations and leg swelling.  Gastrointestinal: Negative for abdominal pain, diarrhea, nausea and vomiting.  Genitourinary: Negative for dysuria, flank pain and pelvic pain.  Musculoskeletal: Negative for arthralgias, myalgias, neck pain and neck stiffness.  Skin: Negative for rash.  Neurological: Negative for dizziness, weakness and headaches.  All other systems reviewed and are negative.    Physical Exam Updated Vital Signs BP (!) 138/97 (BP Location: Left Arm)   Pulse (!) 107   Temp 100.1 F (37.8 C) (Axillary)   Resp 14   LMP 12/29/2017   SpO2 97%   Physical Exam  Constitutional: She is oriented to person, place, and time. She appears well-developed and well-nourished.  Spitting out her own secretions  HENT:  Head: Normocephalic.  Tongue swelling, more on the right. Unable to visualize oropharynx due to tongue swelling. No trismus  Eyes: Conjunctivae are normal.  Neck: Neck supple.  Cardiovascular: Normal rate, regular rhythm and normal heart sounds.  Pulmonary/Chest: Effort normal and breath sounds normal. No respiratory distress. She has no wheezes. She has no rales.  No stridor  Abdominal: Soft. Bowel sounds are normal. She exhibits no distension. There is no tenderness. There is no rebound.  Musculoskeletal: She exhibits no edema.  Neurological: She is alert and oriented to person, place, and time.  Skin: Skin is warm and dry.  Psychiatric: She has a normal mood and affect. Her behavior is  normal.  Nursing note and vitals reviewed.    ED Treatments / Results  Labs (all labs ordered are listed, but only abnormal results are displayed) Labs Reviewed  CBC WITH DIFFERENTIAL/PLATELET - Abnormal; Notable for the following components:      Result Value   WBC 15.4 (*)    Neutro Abs 8.4 (*)    Monocytes Absolute 3.1 (*)    All other components within normal limits  BASIC METABOLIC PANEL - Abnormal; Notable for the following components:   Sodium 132 (*)    Chloride 96 (*)    Creatinine, Ser 1.52 (*)    GFR calc  non Af Amer 48 (*)    GFR calc Af Amer 55 (*)    All other components within normal limits  CULTURE, BLOOD (ROUTINE X 2)  CULTURE, BLOOD (ROUTINE X 2)  I-STAT CG4 LACTIC ACID, ED    EKG None  Radiology No results found.  Procedures Procedures (including critical care time)  Medications Ordered in ED Medications  methylPREDNISolone sodium succinate (SOLU-MEDROL) 125 mg/2 mL injection 125 mg (has no administration in time range)  famotidine (PEPCID) IVPB 20 mg premix (has no administration in time range)  diphenhydrAMINE (BENADRYL) injection 25 mg (has no administration in time range)     Initial Impression / Assessment and Plan / ED Course  I have reviewed the triage vital signs and the nursing notes.  Pertinent labs & imaging results that were available during my care of the patient were reviewed by me and considered in my medical decision making (see chart for details).     6:31 AM Pt with tongue swelling, sensation of throat swelling, unable to swallow. Most likely from lisinopril. WIll order meds, call ENT. On exam, obvious tongue swelling, right worse than left, unable to visualize oropharynx, no stridor.  I spoke with Dr. Leonarda Salon with ear nose throat, who will come by and see patient in scope to evaluate for oropharyngeal swelling.  At this time patient appears to be somewhat comfortable, she is spitting out her secretions, however no respiratory  distress and no stridor.   Dr. Lorelee Cover has seen patient, performed flexible fiberoptic endoscopy, which did not show any evidence of laryngeal or pharyngeal swelling, the edema appeared to be localized to oral cavity.  We will admit patient to medicine and monitor for worsening symptoms.  Vitals:   01/02/18 0745 01/02/18 0800 01/02/18 0830 01/02/18 0900  BP:  (!) 129/94 122/83 128/88  Pulse: 91 92 84 76  Resp: 14 17 14 16   Temp:      TempSrc:      SpO2: 100% 99% 98% 100%     Final Clinical Impressions(s) / ED Diagnoses   Final diagnoses:  Angiotensin converting enzyme inhibitor-aggravated angioedema, initial encounter    ED Discharge Orders    None       Jeannett Senior, PA-C 01/02/18 1334    Molpus, John, MD 01/02/18 1800

## 2018-01-03 DIAGNOSIS — I10 Essential (primary) hypertension: Secondary | ICD-10-CM

## 2018-01-03 DIAGNOSIS — N179 Acute kidney failure, unspecified: Secondary | ICD-10-CM

## 2018-01-03 DIAGNOSIS — T783XXS Angioneurotic edema, sequela: Secondary | ICD-10-CM

## 2018-01-03 DIAGNOSIS — L930 Discoid lupus erythematosus: Secondary | ICD-10-CM

## 2018-01-03 LAB — BASIC METABOLIC PANEL
Anion gap: 12 (ref 5–15)
BUN: 18 mg/dL (ref 6–20)
CO2: 21 mmol/L — ABNORMAL LOW (ref 22–32)
Calcium: 9.1 mg/dL (ref 8.9–10.3)
Chloride: 103 mmol/L (ref 101–111)
Creatinine, Ser: 1.21 mg/dL — ABNORMAL HIGH (ref 0.44–1.00)
GFR calc Af Amer: >60 mL/min (ref >60)
GFR calc non Af Amer: >60 mL/min (ref >60)
Glucose, Bld: 124 mg/dL — ABNORMAL HIGH (ref 65–99)
Potassium: 4 mmol/L (ref 3.5–5.1)
Sodium: 136 mmol/L (ref 135–145)

## 2018-01-03 LAB — CBC
HCT: 43.9 % (ref 36.0–46.0)
Hemoglobin: 14.2 g/dL (ref 12.0–15.0)
MCH: 31.5 pg (ref 26.0–34.0)
MCHC: 32.3 g/dL (ref 30.0–36.0)
MCV: 97.3 fL (ref 78.0–100.0)
Platelets: 145 10*3/uL — ABNORMAL LOW (ref 150–400)
RBC: 4.51 MIL/uL (ref 3.87–5.11)
RDW: 15 % (ref 11.5–15.5)
WBC: 19.1 10*3/uL — ABNORMAL HIGH (ref 4.0–10.5)

## 2018-01-03 LAB — TROPONIN I
Troponin I: 0.05 ng/mL (ref <0.03)
Troponin I: 0.05 ng/mL (ref <0.03)

## 2018-01-03 MED ORDER — CARVEDILOL PHOSPHATE ER 10 MG PO CP24
10.0000 mg | ORAL_CAPSULE | Freq: Every day | ORAL | Status: DC
  Administered 2018-01-03: 10 mg via ORAL
  Filled 2018-01-03: qty 1

## 2018-01-03 MED ORDER — PREDNISONE 10 MG PO TABS: ORAL_TABLET | ORAL | 0 refills | Status: DC

## 2018-01-03 MED ORDER — CHLORTHALIDONE 50 MG PO TABS: 50.0000 mg | ORAL_TABLET | Freq: Every day | ORAL | 0 refills | Status: DC

## 2018-01-03 MED ORDER — DIPHENHYDRAMINE HCL 50 MG PO TABS: 25.0000 mg | ORAL_TABLET | Freq: Four times a day (QID) | ORAL | 0 refills | Status: DC | PRN

## 2018-01-03 MED ORDER — AMLODIPINE BESYLATE 10 MG PO TABS: 10.0000 mg | ORAL_TABLET | Freq: Every day | ORAL | 0 refills | Status: DC

## 2018-01-03 MED ORDER — OXYCODONE HCL 5 MG PO TABS: 5.0000 mg | ORAL_TABLET | Freq: Four times a day (QID) | ORAL | 0 refills | Status: DC | PRN

## 2018-01-03 MED ORDER — HYDROCHLOROTHIAZIDE 25 MG PO TABS
50.0000 mg | ORAL_TABLET | Freq: Every day | ORAL | Status: DC
  Administered 2018-01-03: 50 mg via ORAL
  Filled 2018-01-03: qty 2

## 2018-01-03 MED ORDER — AMLODIPINE BESYLATE 10 MG PO TABS
10.0000 mg | ORAL_TABLET | Freq: Every day | ORAL | Status: DC
  Administered 2018-01-03: 10 mg via ORAL
  Filled 2018-01-03: qty 1

## 2018-01-03 MED ORDER — CHLORTHALIDONE 50 MG PO TABS
50.0000 mg | ORAL_TABLET | Freq: Every day | ORAL | Status: DC
  Administered 2018-01-03: 50 mg via ORAL
  Filled 2018-01-03: qty 1

## 2018-01-03 MED ORDER — CARVEDILOL PHOSPHATE ER 10 MG PO CP24: 10.0000 mg | ORAL_CAPSULE | Freq: Every day | ORAL | 0 refills | Status: DC

## 2018-01-03 NOTE — Progress Notes (Signed)
PATIENT TRANSPORTED OFF UNIT FOR DISCHARGE TO HOME WITH ALL PERSONAL BELONGINGS INCLUDING CELL PHONE, CHARGER, AND DISCHARGE INSTRUCTIONS. NOT DISTRESS NOTED.

## 2018-01-03 NOTE — Progress Notes (Signed)
PATIENT REPORTED IT HURT TONGUE AND BACK OF THROAT TO EAT. NO INCREASED SWELLING NOTED OR REPORTED. WCTM

## 2018-01-03 NOTE — Progress Notes (Signed)
DISCHARGE INSTRUCTIONS GIVEN WITH ALLOWANCE FOR ALL QUESTIONS TO BE ASKED AND ANSWERED. PATIENT REPORTS UNDERSTANDING R/T MEDICATIONS THAT ARE NEW AND STARTED, CHANGED, AND THAT ARE DISCONTINUED PER DR'S ORDERS. EDUCATION GIVEN REGARDING ANGIOEDEMA AND HYPERTENSION; PT REPORTS UNDERSTANDING; SEEMED EAGER IN LEARNING; HANDOUTS GIVEN. PIV X 2 REMOVED; TOLERATED WELL. PATIENT OFF TELE MONITOR; CHANGING INTO PERSONAL CLOTHING. PATIENT REPORTS HER MOTHER IS ON THE WAY TO PICK HER UP. NO DISTRESS NOTED; DENIES NEEDS.

## 2018-01-03 NOTE — Discharge Summary (Signed)
Physician Discharge Summary  Linda [name]  OMV:[id number]  DOB: 06/27/1996  DOA: 01/02/2018 PCP: Tonette Bihari, MD  Admit date: 01/02/2018 Discharge date: 01/03/2018  Admitted From: Home Disposition: Home  Recommendations for Outpatient Follow-up:  1. Follow up with PCP in 1 week to monitor blood pressure 2. Please obtain BMP/CBC in one week to monitor white count and renal function 3. Do not use ACE inhibitor or ARB's 4. Follow-up with rheumatologist 5. Discuss if needed prednisone chronically.  Discharge Condition: Stable CODE STATUS: Full code Diet recommendation: Heart Healthy   Brief/Interim Summary: For full details see H&P/Progress note, but in brief, Linda [name] is a  22 year old female with medical history of lupus and hypertension who presented to the emergency department complaining of tongue swelling/difficulty swallowing. Upon ED evaluation she was found to be tachycardic, hypertensive and tachypneic but not hypoxic. She was given epi by EMS and received Benadryl, Pepcid and Solu-Medrol in the ED. ENT was consulted by EDP who performed flexible laryngoscopy demonstrates edema localized to the oral cavity with no laryngeal involvement. Recommendation to admit for close observation.  She was monitor off lisinopril, treated with IV steroids and Pepcid.  Patient clinically improved, was able to swallow and tolerated diet well.  Blood pressure was monitored during hospital stay which was elevated.  Changes in medication were made (see below).  Blood pressure improved and she was deemed stable for discharge.  Subjective: Patient seen and examined, her tone has significantly improved almost back to normal.  She is able to tolerate diet with no issues.  Ambulating well.  BP improving.  Afebrile.  Creatinine improved.  No acute events overnight.  Discharge Diagnoses/Hospital Course:  Angioedema - Presumed to be due to lisinopril, however strawberries can also cause  anaphylaxis reactions.  Underwent flexible laryngoscopy which demonstrated localized edema to the oral cavity with no laryngeal involvement.  She was treated with IV steroids, Benadryl and Pepcid.  Clinically improved.  Discontinue lisinopril, avoid ACE and ARB's.  Patient also advised to avoid strawberries which is a common allergy. Will discharge on prednisone taper, she is on chronic prednisone for her SLE will provide a taper to continue 5 mg until seen by PCP.  Benadryl as needed.  Primary hypertension  Blood pressure was difficult to control during hospital stay as patient was n.p.o.  New regimen will include amlodipine 10 mg daily, chlorthalidone 50 mg daily and Coreg CR 10 mg daily.  Blood pressure improved with this regimen.  She also was treated with hydralazine IV.  She was advised to follow-up with PCP in 1 week to monitor blood pressure.  Acute renal injury Felt to be prerenal, however could have lupus nephritis.  Renal function improved with IV hydration.  Check renal function in 1 week with PCP.  Might benefit from nephrology evaluation as an outpatient  Cellulitis of right knee - completed antibiotic therapy  SLE  She follows with rheumatology @ [facility],  she reported that she has been on prednisone on and off last time it was started it was about a month ago.  will taper down to 5 mg of prednisone daily until seen by PCP and decide if she needs to be in chronic prednisone for now.  She is in chronic oxycodone prescription for 5 days was given in advised to follow-up with PCP for further refills.  On the day of the discharge the patient's vitals were stable, and no other acute medical condition were reported by patient. the patient was  felt safe to be discharge to home   Discharge Instructions  You were cared for by a hospitalist during your hospital stay. If you have any questions about your discharge medications or the care you received while you were in the hospital after you are  discharged, you can call the unit and asked to speak with the hospitalist on call if the hospitalist that took care of you is not available. Once you are discharged, your primary care physician will handle any further medical issues. Please note that NO REFILLS for any discharge medications will be authorized once you are discharged, as it is imperative that you return to your primary care physician (or establish a relationship with a primary care physician if you do not have one) for your aftercare needs so that they can reassess your need for medications and monitor your lab values.  Discharge Instructions    Call MD for:  difficulty breathing, headache or visual disturbances   Complete by:  As directed    Call MD for:  extreme fatigue   Complete by:  As directed    Call MD for:  hives   Complete by:  As directed    Call MD for:  persistant dizziness or light-headedness   Complete by:  As directed    Call MD for:  persistant nausea and vomiting   Complete by:  As directed    Call MD for:  redness, tenderness, or signs of infection (pain, swelling, redness, odor or green/yellow discharge around incision site)   Complete by:  As directed    Call MD for:  severe uncontrolled pain   Complete by:  As directed    Call MD for:  temperature >100.4   Complete by:  As directed    Diet - low sodium heart healthy   Complete by:  As directed    Discharge instructions   Complete by:  As directed    If swelling recurs or unable to breathe return to the emergency department. Avoid strawberries. Throw away lisinopril pills.   Increase activity slowly   Complete by:  As directed      Allergies as of 01/03/2018      Reactions   Lisinopril Anaphylaxis   Angioedema   Vicodin [hydrocodone-acetaminophen] Itching, Rash      Medication List    STOP taking these medications   dicyclomine 10 MG capsule Commonly known as:  BENTYL   doxycycline 100 MG tablet Commonly known as:  VIBRA-TABS    hydrochlorothiazide 50 MG tablet Commonly known as:  HYDRODIURIL   lisinopril 20 MG tablet Commonly known as:  PRINIVIL,ZESTRIL     TAKE these medications   amLODipine 10 MG tablet Commonly known as:  NORVASC Take 1 tablet (10 mg total) by mouth daily.   carvedilol 10 MG 24 hr capsule Commonly known as:  COREG CR Take 1 capsule (10 mg total) by mouth daily. Start taking on:  01/04/2018   chlorthalidone 50 MG tablet Commonly known as:  HYGROTON Take 1 tablet (50 mg total) by mouth daily. Start taking on:  01/04/2018   diphenhydrAMINE 50 MG tablet Commonly known as:  BENADRYL Take 0.5 tablets (25 mg total) by mouth every 6 (six) hours as needed for itching or allergies.   oxyCODONE 5 MG immediate release tablet Commonly known as:  Oxy IR/ROXICODONE Take 1 tablet (5 mg total) by mouth every 6 (six) hours as needed for up to 5 days for breakthrough pain.   predniSONE 10 MG tablet  Commonly known as:  DELTASONE Take 4 tablets for 3 days; Take 3 tablets for 4 days; Take 2 tablets for 3 days; Take 1 tablet for 4 days; then take half tablet daily. What changed:    medication strength  how much to take  how to take this  when to take this  additional instructions      Follow-up Information    Tonette Bihari, MD. Schedule an appointment as soon as possible for a visit in 1 week(s).   Specialty:  Family Medicine Why:  Hospital follow-up Contact information: [address] [place] [address] [phone]          Allergies  Allergen Reactions  . Lisinopril Anaphylaxis    Angioedema  . Vicodin [Hydrocodone-Acetaminophen] Itching and Rash    Consultations:  ENT    Procedures/Studies: No results found.   Discharge Exam: Vitals:   01/03/18 1300 01/03/18 1400  BP: (!) 142/100 (!) 139/98  Pulse: (!) 57 (!) 55  Resp: 15 19  Temp:    SpO2: 99% 100%   Vitals:   01/03/18 1100 01/03/18 1200 01/03/18 1300 01/03/18 1400  BP: (!) 168/125 (!) 159/110  (!) 142/100 (!) 139/98  Pulse: (!) 59 60 (!) 57 (!) 55  Resp: 19 19 15 19   Temp:  (!) 97.5 F (36.4 C)    TempSrc:  Axillary    SpO2: 100% 96% 99% 100%  Weight:      Height:        General: Pt is alert, awake, not in acute distress HEENT: Tongue significantly improved, mild swelling, normal swallow, no LAD, facial swelling resolve Cardiovascular: RRR, S1/S2 +, no rubs, no gallops Respiratory: CTA bilaterally, no wheezing, no rhonchi Extremities: no lower extremity edema   The results of significant diagnostics from this hospitalization (including imaging, microbiology, ancillary and laboratory) are listed below for reference.     Microbiology: Recent Results (from the past 240 hour(s))  Blood culture (routine x 2)     Status: None (Preliminary result)   Collection Time: 01/02/18  6:05 AM  Result Value Ref Range Status   Specimen Description   Final    BLOOD RIGHT ARM Performed at [facility] [address], [place], [place] [address]    Special Requests   Final    BOTTLES DRAWN AEROBIC AND ANAEROBIC Blood Culture adequate volume Performed at [facility] [address], [place], [place] [address]    Culture   Final    NO GROWTH < 24 HOURS Performed at [facility] [address] [place], [place] [address]    Report Status PENDING  Incomplete  Blood culture (routine x 2)     Status: None (Preliminary result)   Collection Time: 01/02/18  6:25 AM  Result Value Ref Range Status   Specimen Description   Final    BLOOD LEFT ARM Performed at [facility] [address], [place], [place] [address]    Special Requests   Final    BOTTLES DRAWN AEROBIC AND ANAEROBIC Blood Culture adequate volume Performed at [facility] [address] [place], [place] [address]    Culture   Final    NO GROWTH < 24 HOURS Performed at [facility] [address] [place],  [address]     Report Status PENDING  Incomplete  MRSA PCR Screening     Status: None   Collection Time: 01/02/18  4:25 PM  Result  Value Ref Range Status   MRSA by PCR NEGATIVE NEGATIVE Final    Comment:        The GeneXpert MRSA Assay (FDA approved for NASAL specimens only), is one component of a comprehensive MRSA colonization surveillance program. It is not intended to diagnose MRSA infection nor to guide or monitor treatment for MRSA infections. Performed at [facility], [place] [address], [place], [place] [address]      Labs: BNP (last 3 results) Recent Labs    11/29/17 1324  BNP 2,979.8*   Basic Metabolic Panel: Recent Labs  Lab 01/02/18 0605 01/03/18 0549  NA 132* 136  K 3.6 4.0  CL 96* 103  CO2 22 21*  GLUCOSE 92 124*  BUN 16 18  CREATININE 1.52* 1.21*  CALCIUM 9.0 9.1   Liver Function Tests: No results for input(s): AST, ALT, ALKPHOS, BILITOT, PROT, ALBUMIN in the last 168 hours. No results for input(s): LIPASE, AMYLASE in the last 168 hours. No results for input(s): AMMONIA in the last 168 hours. CBC: Recent Labs  Lab 01/02/18 0605 01/03/18 0549  WBC 15.4* 19.1*  NEUTROABS 8.4*  --   HGB 13.4 14.2  HCT 40.3 43.9  MCV 96.9 97.3  PLT 172 145*   Cardiac Enzymes: Recent Labs  Lab 01/02/18 1832 01/02/18 2349 01/03/18 0549  TROPONINI 0.05* 0.05* 0.05*   BNP: Invalid input(s): POCBNP CBG: No results for input(s): GLUCAP in the last 168 hours. D-Dimer No results for input(s): DDIMER in the last 72 hours. Hgb A1c No results for input(s): HGBA1C in the last 72 hours. Lipid Profile No results for input(s): CHOL, HDL, LDLCALC, TRIG, CHOLHDL, LDLDIRECT in the last 72 hours. Thyroid function studies No results for input(s): TSH, T4TOTAL, T3FREE, THYROIDAB in the last 72 hours.  Invalid input(s): FREET3 Anemia work up No results for input(s): VITAMINB12, FOLATE, FERRITIN, TIBC, IRON, RETICCTPCT in the last 72 hours. Urinalysis     Component Value Date/Time   COLORURINE YELLOW 12/24/2017 1637   APPEARANCEUR HAZY (A) 12/24/2017 1637   LABSPEC 1.015 12/24/2017 1637   PHURINE 5.0 12/24/2017 1637   GLUCOSEU NEGATIVE 12/24/2017 1637   HGBUR SMALL (A) 12/24/2017 1637   BILIRUBINUR NEGATIVE 12/24/2017 1637   KETONESUR NEGATIVE 12/24/2017 1637   PROTEINUR 100 (A) 12/24/2017 1637   UROBILINOGEN 1.0 03/30/2015 2328   NITRITE NEGATIVE 12/24/2017 1637   LEUKOCYTESUR SMALL (A) 12/24/2017 1637   Sepsis Labs Invalid input(s): PROCALCITONIN,  WBC,  LACTICIDVEN Microbiology Recent Results (from the past 240 hour(s))  Blood culture (routine x 2)     Status: None (Preliminary result)   Collection Time: 01/02/18  6:05 AM  Result Value Ref Range Status   Specimen Description   Final    BLOOD RIGHT ARM Performed at [facility] [address], [place], [place] [address]    Special Requests   Final    BOTTLES DRAWN AEROBIC AND ANAEROBIC Blood Culture adequate volume Performed at [facility] [address] [place], [place] [address]    Culture   Final    NO GROWTH < 24 HOURS Performed at [facility] [address] [place], [place] [address]    Report Status PENDING  Incomplete  Blood culture (routine x 2)     Status: None (Preliminary result)   Collection Time: 01/02/18  6:25 AM  Result Value Ref Range Status   Specimen Description   Final    BLOOD LEFT ARM Performed at [facility], [place] [address], [place], [place] [address]    Special Requests   Final    BOTTLES DRAWN AEROBIC AND ANAEROBIC Blood Culture adequate volume Performed at [facility] [address], [place], [place] [address]    Culture   Final    NO GROWTH < 24 HOURS Performed at [facility] [address], [place], [place] [address]    Report Status PENDING  Incomplete  MRSA PCR Screening     Status: None   Collection Time: 01/02/18  4:25 PM  Result  Value Ref Range Status   MRSA by PCR NEGATIVE NEGATIVE Final    Comment:        The GeneXpert MRSA Assay (FDA approved for NASAL specimens only), is one component of a comprehensive MRSA colonization surveillance program. It is not intended to diagnose MRSA infection nor to guide or monitor treatment for MRSA infections. Performed at [facility], [place] [address] [place], [place] [address]      Time coordinating discharge: 33 minutes  SIGNED:  Chipper Oman, MD  [facility] 01/03/2018, 3:44 PM  Pager please text page via  [email or web address]  Note - This record has been created using [employer]. Chart creation errors have been sought, but may not always have been located. Such creation errors do not reflect on the standard of medical care.

## 2018-01-03 NOTE — Progress Notes (Signed)
PATIENT REPORTED MOTHER IS DOWNSTAIRS WAITING; TRANSPORT NOTIFIED.

## 2018-01-06 ENCOUNTER — Other Ambulatory Visit: Payer: Self-pay

## 2018-01-06 ENCOUNTER — Emergency Department (HOSPITAL_COMMUNITY): Payer: Medicaid Other

## 2018-01-06 ENCOUNTER — Encounter (HOSPITAL_COMMUNITY): Payer: Self-pay | Admitting: Emergency Medicine

## 2018-01-06 ENCOUNTER — Inpatient Hospital Stay (HOSPITAL_COMMUNITY)
Admission: EM | Admit: 2018-01-06 | Discharge: 2018-01-08 | DRG: 159 | Disposition: A | Discharge status: Home / Self Care | Payer: Medicaid Other | Attending: Internal Medicine | Admitting: Internal Medicine

## 2018-01-06 DIAGNOSIS — D899 Disorder involving the immune mechanism, unspecified: Secondary | ICD-10-CM | POA: Diagnosis present

## 2018-01-06 DIAGNOSIS — I10 Essential (primary) hypertension: Secondary | ICD-10-CM | POA: Diagnosis present

## 2018-01-06 DIAGNOSIS — A4101 Sepsis due to Methicillin susceptible Staphylococcus aureus: Secondary | ICD-10-CM | POA: Diagnosis not present

## 2018-01-06 DIAGNOSIS — R220 Localized swelling, mass and lump, head: Secondary | ICD-10-CM

## 2018-01-06 DIAGNOSIS — Z8619 Personal history of other infectious and parasitic diseases: Secondary | ICD-10-CM | POA: Diagnosis not present

## 2018-01-06 DIAGNOSIS — Z79899 Other long term (current) drug therapy: Secondary | ICD-10-CM

## 2018-01-06 DIAGNOSIS — Z832 Family history of diseases of the blood and blood-forming organs and certain disorders involving the immune mechanism: Secondary | ICD-10-CM

## 2018-01-06 DIAGNOSIS — F17210 Nicotine dependence, cigarettes, uncomplicated: Secondary | ICD-10-CM | POA: Diagnosis present

## 2018-01-06 DIAGNOSIS — T738XXA Other effects of deprivation, initial encounter: Secondary | ICD-10-CM | POA: Diagnosis not present

## 2018-01-06 DIAGNOSIS — I7300 Raynaud's syndrome without gangrene: Secondary | ICD-10-CM | POA: Diagnosis present

## 2018-01-06 DIAGNOSIS — I776 Arteritis, unspecified: Secondary | ICD-10-CM | POA: Diagnosis present

## 2018-01-06 DIAGNOSIS — Z8249 Family history of ischemic heart disease and other diseases of the circulatory system: Secondary | ICD-10-CM | POA: Diagnosis not present

## 2018-01-06 DIAGNOSIS — Z7952 Long term (current) use of systemic steroids: Secondary | ICD-10-CM

## 2018-01-06 DIAGNOSIS — D72829 Elevated white blood cell count, unspecified: Secondary | ICD-10-CM

## 2018-01-06 DIAGNOSIS — A419 Sepsis, unspecified organism: Secondary | ICD-10-CM | POA: Insufficient documentation

## 2018-01-06 DIAGNOSIS — R21 Rash and other nonspecific skin eruption: Secondary | ICD-10-CM | POA: Diagnosis present

## 2018-01-06 DIAGNOSIS — R700 Elevated erythrocyte sedimentation rate: Secondary | ICD-10-CM | POA: Diagnosis present

## 2018-01-06 DIAGNOSIS — Z888 Allergy status to other drugs, medicaments and biological substances status: Secondary | ICD-10-CM

## 2018-01-06 DIAGNOSIS — Z793 Long term (current) use of hormonal contraceptives: Secondary | ICD-10-CM | POA: Diagnosis not present

## 2018-01-06 DIAGNOSIS — M329 Systemic lupus erythematosus, unspecified: Secondary | ICD-10-CM | POA: Diagnosis present

## 2018-01-06 DIAGNOSIS — R1310 Dysphagia, unspecified: Secondary | ICD-10-CM | POA: Diagnosis present

## 2018-01-06 DIAGNOSIS — K146 Glossodynia: Secondary | ICD-10-CM | POA: Diagnosis present

## 2018-01-06 DIAGNOSIS — Z885 Allergy status to narcotic agent status: Secondary | ICD-10-CM | POA: Diagnosis not present

## 2018-01-06 DIAGNOSIS — T783XXA Angioneurotic edema, initial encounter: Secondary | ICD-10-CM

## 2018-01-06 DIAGNOSIS — Z8739 Personal history of other diseases of the musculoskeletal system and connective tissue: Secondary | ICD-10-CM | POA: Diagnosis not present

## 2018-01-06 DIAGNOSIS — K1370 Unspecified lesions of oral mucosa: Secondary | ICD-10-CM | POA: Diagnosis not present

## 2018-01-06 DIAGNOSIS — Z8269 Family history of other diseases of the musculoskeletal system and connective tissue: Secondary | ICD-10-CM | POA: Diagnosis not present

## 2018-01-06 DIAGNOSIS — Z309 Encounter for contraceptive management, unspecified: Secondary | ICD-10-CM

## 2018-01-06 HISTORY — DX: Sepsis, unspecified organism: A41.9

## 2018-01-06 LAB — CBC WITH DIFFERENTIAL/PLATELET
Basophils Absolute: 0 10*3/uL (ref 0.0–0.1)
Basophils Relative: 0 %
Eosinophils Absolute: 0.1 10*3/uL (ref 0.0–0.7)
Eosinophils Relative: 0 %
HCT: 42.4 % (ref 36.0–46.0)
Hemoglobin: 14 g/dL (ref 12.0–15.0)
Lymphocytes Relative: 24 %
Lymphs Abs: 4.9 10*3/uL (ref 0.7–4.0)
MCH: 31.5 pg (ref 26.0–34.0)
MCHC: 33 g/dL (ref 30.0–36.0)
MCV: 95.5 fL (ref 78.0–100.0)
Monocytes Absolute: 3.1 10*3/uL (ref 0.1–1.0)
Monocytes Relative: 15 %
Neutro Abs: 12 10*3/uL (ref 1.7–7.7)
Neutrophils Relative %: 61 %
Platelets: 139 10*3/uL — ABNORMAL LOW (ref 150–400)
RBC: 4.44 MIL/uL (ref 3.87–5.11)
RDW: 15 % (ref 11.5–15.5)
WBC: 20 10*3/uL — ABNORMAL HIGH (ref 4.0–10.5)

## 2018-01-06 LAB — BASIC METABOLIC PANEL
Anion gap: 14 (ref 5–15)
BUN: 24 mg/dL — ABNORMAL HIGH (ref 6–20)
CO2: 24 mmol/L (ref 22–32)
Calcium: 9.7 mg/dL (ref 8.9–10.3)
Chloride: 96 mmol/L — ABNORMAL LOW (ref 101–111)
Creatinine, Ser: 1.58 mg/dL — ABNORMAL HIGH (ref 0.44–1.00)
GFR calc Af Amer: 53 mL/min — ABNORMAL LOW (ref >60)
GFR calc non Af Amer: 46 mL/min — ABNORMAL LOW (ref >60)
Glucose, Bld: 76 mg/dL (ref 65–99)
Potassium: 3.5 mmol/L (ref 3.5–5.1)
Sodium: 134 mmol/L — ABNORMAL LOW (ref 135–145)

## 2018-01-06 LAB — I-STAT BETA HCG BLOOD, ED (MC, WL, AP ONLY): I-stat hCG, quantitative: <5 m[IU]/mL (ref <5)

## 2018-01-06 LAB — TSH: TSH: 0.477 u[IU]/mL (ref 0.350–4.500)

## 2018-01-06 MED ORDER — DIPHENHYDRAMINE HCL 50 MG/ML IJ SOLN
25.0000 mg | Freq: Once | INTRAMUSCULAR | Status: AC
  Administered 2018-01-06: 25 mg via INTRAVENOUS
  Filled 2018-01-06: qty 1

## 2018-01-06 MED ORDER — LINEZOLID 600 MG/300ML IV SOLN
600.0000 mg | Freq: Two times a day (BID) | INTRAVENOUS | Status: DC
  Administered 2018-01-06: 600 mg via INTRAVENOUS
  Filled 2018-01-06 (×2): qty 300

## 2018-01-06 MED ORDER — FLUCONAZOLE 100MG IVPB
100.0000 mg | INTRAVENOUS | Status: DC
  Administered 2018-01-06 – 2018-01-07 (×2): 100 mg via INTRAVENOUS
  Filled 2018-01-06 (×3): qty 50

## 2018-01-06 MED ORDER — EPINEPHRINE 0.3 MG/0.3ML IJ SOAJ
0.3000 mg | Freq: Once | INTRAMUSCULAR | Status: AC
  Administered 2018-01-06: 0.3 mg via INTRAMUSCULAR
  Filled 2018-01-06: qty 0.3

## 2018-01-06 MED ORDER — HYDROMORPHONE HCL 1 MG/ML IJ SOLN
0.5000 mg | Freq: Once | INTRAMUSCULAR | Status: AC
  Administered 2018-01-06: 0.5 mg via INTRAVENOUS
  Filled 2018-01-06: qty 1

## 2018-01-06 MED ORDER — DEXTROSE 5 % IV SOLN
INTRAVENOUS | Status: DC
  Administered 2018-01-06 – 2018-01-08 (×4): via INTRAVENOUS

## 2018-01-06 MED ORDER — METHYLPREDNISOLONE SODIUM SUCC 125 MG IJ SOLR
60.0000 mg | Freq: Two times a day (BID) | INTRAMUSCULAR | Status: DC
  Administered 2018-01-06 – 2018-01-08 (×4): 60 mg via INTRAVENOUS
  Filled 2018-01-06 (×4): qty 2

## 2018-01-06 MED ORDER — OXYCODONE HCL 5 MG PO TABS
5.0000 mg | ORAL_TABLET | ORAL | Status: DC | PRN
Start: 2018-01-06 — End: 2018-01-06
  Administered 2018-01-06: 5 mg via ORAL
  Filled 2018-01-06: qty 1

## 2018-01-06 MED ORDER — KETOROLAC TROMETHAMINE 30 MG/ML IJ SOLN
30.0000 mg | Freq: Four times a day (QID) | INTRAMUSCULAR | Status: DC | PRN
  Administered 2018-01-06 – 2018-01-07 (×3): 30 mg via INTRAVENOUS
  Filled 2018-01-06 (×3): qty 1

## 2018-01-06 MED ORDER — HYDROMORPHONE HCL 1 MG/ML IJ SOLN
0.5000 mg | INTRAMUSCULAR | Status: DC | PRN
  Administered 2018-01-06 – 2018-01-08 (×10): 0.5 mg via INTRAVENOUS
  Filled 2018-01-06 (×10): qty 1

## 2018-01-06 MED ORDER — SODIUM CHLORIDE 0.9 % IV BOLUS
1000.0000 mL | Freq: Once | INTRAVENOUS | Status: AC
  Administered 2018-01-06: 1000 mL via INTRAVENOUS

## 2018-01-06 MED ORDER — ETONOGESTREL 68 MG ~~LOC~~ IMPL: 68.0000 mg | DRUG_IMPLANT | Freq: Once | SUBCUTANEOUS | Status: DC

## 2018-01-06 MED ORDER — IOPAMIDOL (ISOVUE-300) INJECTION 61%
75.0000 mL | Freq: Once | INTRAVENOUS | Status: AC | PRN
  Administered 2018-01-06: 75 mL via INTRAVENOUS

## 2018-01-06 MED ORDER — ONDANSETRON HCL 4 MG/2ML IJ SOLN
4.0000 mg | Freq: Once | INTRAMUSCULAR | Status: DC
  Filled 2018-01-06: qty 2

## 2018-01-06 MED ORDER — DEXAMETHASONE SODIUM PHOSPHATE 10 MG/ML IJ SOLN
10.0000 mg | Freq: Once | INTRAMUSCULAR | Status: AC
  Administered 2018-01-06: 10 mg via INTRAVENOUS
  Filled 2018-01-06: qty 1

## 2018-01-06 MED ORDER — CLINDAMYCIN PHOSPHATE 600 MG/50ML IV SOLN
600.0000 mg | Freq: Once | INTRAVENOUS | Status: AC
  Administered 2018-01-06: 600 mg via INTRAVENOUS
  Filled 2018-01-06: qty 50

## 2018-01-06 MED ORDER — FENTANYL CITRATE (PF) 100 MCG/2ML IJ SOLN
50.0000 ug | Freq: Once | INTRAMUSCULAR | Status: AC
  Administered 2018-01-06: 50 ug via INTRAVENOUS
  Filled 2018-01-06: qty 2

## 2018-01-06 MED ORDER — SODIUM CHLORIDE 0.9 % IV SOLN
INTRAVENOUS | Status: DC
  Administered 2018-01-06: 10:00:00 via INTRAVENOUS

## 2018-01-06 MED ORDER — MORPHINE SULFATE (PF) 4 MG/ML IV SOLN
6.0000 mg | Freq: Once | INTRAVENOUS | Status: AC
  Administered 2018-01-06: 6 mg via INTRAVENOUS
  Filled 2018-01-06: qty 2

## 2018-01-06 MED ORDER — NYSTATIN 100000 UNIT/ML MT SUSP
5.0000 mL | Freq: Four times a day (QID) | OROMUCOSAL | Status: DC
  Administered 2018-01-06 – 2018-01-08 (×6): 500000 [IU] via ORAL
  Filled 2018-01-06 (×7): qty 5

## 2018-01-06 MED ORDER — FAMOTIDINE IN NACL 20-0.9 MG/50ML-% IV SOLN
20.0000 mg | Freq: Once | INTRAVENOUS | Status: AC
  Administered 2018-01-06: 20 mg via INTRAVENOUS
  Filled 2018-01-06: qty 50

## 2018-01-06 MED ORDER — VANCOMYCIN HCL IN DEXTROSE 1-5 GM/200ML-% IV SOLN: 1000.0000 mg | INTRAVENOUS | Status: DC

## 2018-01-06 MED ORDER — HYDRALAZINE HCL 20 MG/ML IJ SOLN
5.0000 mg | INTRAMUSCULAR | Status: DC | PRN
  Administered 2018-01-06 – 2018-01-08 (×6): 5 mg via INTRAVENOUS
  Filled 2018-01-06 (×8): qty 1

## 2018-01-06 MED ORDER — VANCOMYCIN HCL 10 G IV SOLR
1750.0000 mg | Freq: Once | INTRAVENOUS | Status: DC
  Administered 2018-01-06: 1750 mg via INTRAVENOUS
  Filled 2018-01-06: qty 1750

## 2018-01-06 NOTE — ED Triage Notes (Signed)
Patient here from home with complaints of difficulty swallowing difficulties, tongue swelling. Reports that she was here on 5/5 for same. Denies taking anything this morning. 99% RA

## 2018-01-06 NOTE — ED Provider Notes (Addendum)
[facility] DEPT Provider Note   CSN: [id number] Arrival date & time: 01/06/18  7829     History   Chief Complaint Chief Complaint  Patient presents with  . Angioedema    HPI Linda [name] is a 22 y.o. female who presents to the ED for c/o angioedema. She has  has a past medical history of Acute kidney injury ([place]), Angioedema, Hypertension, Lupus ([place]), Migraines, and Mixed connective tissue disease ([place]). Patient was seen on 01/02/2018 for same with admission. Her angioedema was thought to be secondary to lisinopril use. She c/o Swelling on the R side of her tongue along with severe pain and inability to swallow. She has noticed white plaquing on her tongue since her "allergic reaction" to the angioedema on 5 5.  She has significant pain with opening her mouth.  She is unable to tolerate her own saliva.  She denies wheezing, itching.  She did not ingest any suspicious foods.  She is immunocompromised on daily prednisone therapy.  She is notably hypertensive and frequently admitted for hypertensive urgency.  HPI  Past Medical History:  Diagnosis Date  . Acute kidney injury ([place])   . Angioedema   . Hypertension   . Lupus ([place])   . Migraines   . Mixed connective tissue disease [facility])     Patient Active Problem List   Diagnosis Date Noted  . Angioedema 01/02/2018  . Tachycardia 01/02/2018  . Acute kidney injury ([place])   . Hypertensive urgency 11/29/2017  . Enteritis 11/29/2017  . Nausea vomiting and diarrhea 11/29/2017  . Leucocytosis 11/29/2017  . Septic prepatellar bursitis of right knee 08/19/2017  . Grief reaction 08/19/2017  . Right knee pain   . Elbow pain, right   . Back pain 05/04/2017  . Bilateral knee swelling 04/20/2017  . History of connective tissue disease   . Leg swelling   . Abdominal pain   . Lupus erythematosus   . Cellulitis 04/09/2017  . Primary hypertension 12/11/2016  . Mixed connective tissue disease ([place])  02/11/2016  . High BMI 01/19/2014  . Implanon in place 01/19/2014    Past Surgical History:  Procedure Laterality Date  . I&D EXTREMITY Right 08/06/2017   Procedure: IRRIGATION AND DEBRIDEMENT KNEE;  Surgeon: Shona Needles, MD;  Location: [place];  Service: Orthopedics;  Laterality: Right;  . MULTIPLE TOOTH EXTRACTIONS       OB History    Gravida  0   Para  0   Term  0   Preterm  0   AB  0   Living  0     SAB  0   TAB  0   Ectopic  0   Multiple  0   Live Births               Home Medications    Prior to Admission medications   Medication Sig Start Date End Date Taking? Authorizing Provider  amLODipine (NORVASC) 10 MG tablet Take 1 tablet (10 mg total) by mouth daily. 01/03/18   Doreatha Lew, MD  carvedilol (COREG CR) 10 MG 24 hr capsule Take 1 capsule (10 mg total) by mouth daily. 01/04/18   Doreatha Lew, MD  chlorthalidone (HYGROTON) 50 MG tablet Take 1 tablet (50 mg total) by mouth daily. 01/04/18   Doreatha Lew, MD  diphenhydrAMINE (BENADRYL) 50 MG tablet Take 0.5 tablets (25 mg total) by mouth every 6 (six) hours as needed for itching or allergies. 01/03/18  Patrecia Pour, Christean Grief, MD  oxyCODONE (OXY IR/ROXICODONE) 5 MG immediate release tablet Take 1 tablet (5 mg total) by mouth every 6 (six) hours as needed for up to 5 days for breakthrough pain. 01/03/18 01/08/18  Doreatha Lew, MD  predniSONE (DELTASONE) 10 MG tablet Take 4 tablets for 3 days; Take 3 tablets for 4 days; Take 2 tablets for 3 days; Take 1 tablet for 4 days; then take half tablet daily. 01/03/18   Doreatha Lew, MD    Family History Family History  Problem Relation Age of Onset  . Lupus Mother   . Hypertension Mother   . Kidney disease Mother   . Diabetes Maternal Grandmother     Social History Social History   Tobacco Use  . Smoking status: Current Every Day Smoker    Packs/day: 0.00    Types: Cigarettes, Cigars    Start date: 02/11/2015  . Smokeless  tobacco: Never Used  Substance Use Topics  . Alcohol use: No    Alcohol/week: 0.0 oz    Comment: social  . Drug use: No     Allergies   Lisinopril and Vicodin [hydrocodone-acetaminophen]   Review of Systems Review of Systems Ten systems reviewed and are negative for acute change, except as noted in the HPI.    Physical Exam Updated Vital Signs BP (!) 183/123 (BP Location: Right Arm)   Pulse (!) 103   Temp 99.2 F (37.3 C) (Axillary)   Resp 18   LMP 12/29/2017   SpO2 100%   Physical Exam  Constitutional: She is oriented to person, place, and time. She appears well-developed and well-nourished. No distress.  HENT:  Head: Normocephalic and atraumatic.    Mouth/Throat: There is trismus in the jaw.    Eyes: Conjunctivae are normal. No scleral icterus.  Neck: Normal range of motion.  Cardiovascular: Normal rate, regular rhythm and normal heart sounds. Exam reveals no gallop and no friction rub.  No murmur heard. Pulmonary/Chest: Effort normal and breath sounds normal. No respiratory distress.  Abdominal: Soft. Bowel sounds are normal. She exhibits no distension and no mass. There is no tenderness. There is no guarding.  Neurological: She is alert and oriented to person, place, and time.  Skin: Skin is warm and dry. She is not diaphoretic.  Psychiatric: Her behavior is normal.  Nursing note and vitals reviewed.    ED Treatments / Results  Labs (all labs ordered are listed, but only abnormal results are displayed) Labs Reviewed  CBC WITH DIFFERENTIAL/PLATELET  BASIC METABOLIC PANEL  I-STAT BETA HCG BLOOD, ED ([facility], [facility], [facility] ONLY)    EKG None  Radiology No results found.  Procedures .Critical Care Performed by: Margarita Mail, PA-C Authorized by: Margarita Mail, PA-C   Critical care provider statement:    Critical care time (minutes):  50   Critical care was necessary to treat or prevent imminent or life-threatening deterioration of the following  conditions: angioedema.   Critical care was time spent personally by me on the following activities:  Development of treatment plan with patient or surrogate, discussions with consultants, evaluation of patient's response to treatment, examination of patient, interpretation of cardiac output measurements, ordering and performing treatments and interventions, ordering and review of laboratory studies, ordering and review of radiographic studies, pulse oximetry, re-evaluation of patient's condition and review of old charts   (including critical care time)  Medications Ordered in ED Medications  sodium chloride 0.9 % bolus 1,000 mL (has no administration in time range)  And  0.9 %  sodium chloride infusion (has no administration in time range)  diphenhydrAMINE (BENADRYL) injection 25 mg (has no administration in time range)  EPINEPHrine (EPI-PEN) injection 0.3 mg (has no administration in time range)  dexamethasone (DECADRON) injection 10 mg (has no administration in time range)  famotidine (PEPCID) IVPB 20 mg premix (has no administration in time range)     Initial Impression / Assessment and Plan / ED Course  I have reviewed the triage vital signs and the nursing notes.  Pertinent labs & imaging results that were available during my care of the patient were reviewed by me and considered in my medical decision making (see chart for details).  Clinical Course as of Jan 06 1801  Thu Jan 06, 2018  0816 Patient with swelling and angioedema.  Clinically I have concern for potential infection given the amount and severity of pain she seems to be having along with the trismus.  Patient seen and shared visit with Dr.Kohut who agrees with plan for CT scan.   [AH]  1016 Spoke with Radiology. Unable to differentiate definitively b/n infectious/ vs angioedema. No source for potential infection or localized fluid collection. The patient does have involvement of some of the muscles that may explain her  trismus.    [AH]  1041 Patient still not tolerating fluids. Spitting into suction. Still rates pain as severe.   [AH]  1345 I spoke with Dr. Ashley Royalty on-call for rheumatology at [facility]. He does not feel that Transfer would provide any difference in her care. He advises to hold plaquenil as she could have an adverse reaction until she follows up with her rheumatologist.   [AH]    Clinical Course User Index [AH] Margarita Mail, PA-C   Vision with recurrent angioedema.  Patient will be admitted to the stepdown unit today.  We have discussed the case with rheumatology at [facility].  Patient is stable without any significant decline at this time.  Final Clinical Impressions(s) / ED Diagnoses   Final diagnoses:  Angioedema, initial encounter    ED Discharge Orders    None       Margarita Mail, PA-C 01/06/18 Johnnye Lana    Virgel Manifold, MD 01/12/18 [place], [place], PA-C 01/31/18 8616    Virgel Manifold, MD 01/31/18 [phone]

## 2018-01-06 NOTE — ED Notes (Signed)
ED Provider at bedside. EDPA

## 2018-01-06 NOTE — ED Notes (Signed)
CRITICAL CARE AT BEDSIDE EVALUATING PT

## 2018-01-06 NOTE — Progress Notes (Signed)
Pharmacy Antibiotic Note  Linda [name] is a 22 y.o. female admitted on 01/06/2018 with sepsis.  Pharmacy has been consulted for Vancomycin dosing.  Plan: 1) Vancomycin 1750mg  IV x 1 loading dose then 1000mg  IV q24 2) No gram negative coverage ordered - Md to address  Weight: 200 lb (90.7 kg)  Temp (24hrs), Avg:99.2 F (37.3 C), Min:99.2 F (37.3 C), Max:99.2 F (37.3 C)  Recent Labs  Lab 01/02/18 0605 01/02/18 0809 01/03/18 0549 01/06/18 0758  WBC 15.4*  --  19.1* 20.0*  CREATININE 1.52*  --  1.21* 1.58*  LATICACIDVEN  --  1.31  --   --     Estimated Creatinine Clearance: 63.4 mL/min (A) (by C-G formula based on SCr of 1.58 mg/dL (H)).    Allergies  Allergen Reactions  . Lisinopril Anaphylaxis    Angioedema  . Vicodin [Hydrocodone-Acetaminophen] Itching and Rash     Thank you for allowing pharmacy to be a part of this patient's care.   Adrian Saran, PharmD, BCPS Pager [phone] 01/06/2018 2:42 PM

## 2018-01-06 NOTE — ED Notes (Signed)
ATTEMPTED 2ND IV WITH ULTRASOUND. NOT SUCCESSFUL. REQUESTED MORGAN RN CHARGE TO ASSIST. AWAITING RESPONSE

## 2018-01-06 NOTE — ED Notes (Signed)
Admitting Provider at bedside. 

## 2018-01-06 NOTE — ED Notes (Signed)
ADMITTING Provider at bedside. 

## 2018-01-06 NOTE — Progress Notes (Signed)
Pharmacy Antibiotic Note  DORETHA GODING is a 22 y.o. female admitted on 01/06/2018 with oral candidiasis.  Pharmacy has been consulted for Diflucan dosing.  Plan: 1) Diflucan 100mg  IV daily - change to PO as appropriate 2) Will sign off  Weight: 200 lb (90.7 kg)  Temp (24hrs), Avg:99.2 F (37.3 C), Min:99.2 F (37.3 C), Max:99.2 F (37.3 C)  Recent Labs  Lab 01/02/18 0605 01/02/18 0809 01/03/18 0549 01/06/18 0758  WBC 15.4*  --  19.1* 20.0*  CREATININE 1.52*  --  1.21* 1.58*  LATICACIDVEN  --  1.31  --   --     Estimated Creatinine Clearance: 63.4 mL/min (A) (by C-G formula based on SCr of 1.58 mg/dL (H)).    Allergies  Allergen Reactions  . Lisinopril Anaphylaxis    Angioedema  . Vicodin [Hydrocodone-Acetaminophen] Itching and Rash    Thank you for allowing pharmacy to be a part of this patient's care.   Adrian Saran, PharmD, BCPS Pager [phone] 01/06/2018 2:28 PM

## 2018-01-06 NOTE — H&P (Addendum)
History and Physical  Linda [name] MHW:[id number] DOB: 03/10/1996 DOA: 01/06/2018  PCP: Tonette Bihari, MD  Dermatologist is Laruth Bouchard [facility] Rheumatologist is Dr. Alric [place] Nephrologist is Dr. Brett Canales  Chief Complaint: Tongue swelling and discomfort and severe pain  HPI:  22 year old female history of  undifferentiated connective tissue disorder [uctd] lupus???--previously on chloroquine, resultant Raynaud's phenomenon--sen prior at [facility] as a pediatric patient ? Now apparent Discoid lupus,  HTN   recent admission 5/5-5/6 2019 to hospitalist service-at that time presumed to have ACE versus strawberry induced angioedema---had flex laryngiscopy at the time -CT soft tissue showed? gen edema sent home on chronic steroids and taper and states has completed the same Diagnosed with lupus apparently at age 66 her mother also has lupus and is on dialysis   admitted also 4/26-4/28 secondary to right knee pain with purulent discharge  Dr haddix saw patient in consult that admit- Had been seen prior in 2018 by him as well [has had previous wounds growing MSSA December 2018] --felt more SubQ calcifications ---so given antibiotics Rocephin and vancomycin she was discharged on oral doxycycline--WC at this 11/2017 admit grew grp B strep  Only new med chlorthalidone/coreg/steroid taper--states compliance with the same  Back [facility] ED 5/9 AM with severe pain difficulty even swallowing her own saliva  ED Course: Patient was given clindamycin, I started fluconazole and ask critical care to see the patient-ENT is available and will be seeing the patient in the morning not emergently as there is no stridor or impending airway compromise Patient received up to the time that I saw her 50 of fentanyl 6 of morphine and Dilaudid 0.5  Review of Systems:   Patient is in severe pain and movement of the mouth and jaw causes trismus she has tenderness to the  tongue Able to talk, breathe and asking for ice chips [seen 2-3 times today in ED room prior to transport to SDU] at my last visit with her around 17:30  Past Medical History:  Diagnosis Date  . Acute kidney injury ([place])   . Angioedema   . Hypertension   . Lupus ([place])   . Migraines   . Mixed connective tissue disease [facility])     Past Surgical History:  Procedure Laterality Date  . I&D EXTREMITY Right 08/06/2017   Procedure: IRRIGATION AND DEBRIDEMENT KNEE;  Surgeon: Shona Needles, MD;  Location: [place];  Service: Orthopedics;  Laterality: Right;  . MULTIPLE TOOTH EXTRACTIONS       reports that she has been smoking cigarettes and cigars.  She started smoking about 2 years ago. She has been smoking about 0.00 packs per day. She has never used smokeless tobacco. She reports that she does not drink alcohol or use drugs. Mobility: Independent  Allergies  Allergen Reactions  . Lisinopril Anaphylaxis    Angioedema  . Vicodin [Hydrocodone-Acetaminophen] Itching and Rash    Family History  Problem Relation Age of Onset  . Lupus Mother   . Hypertension Mother   . Kidney disease Mother   . Diabetes Maternal Grandmother      Prior to Admission medications   Medication Sig Start Date End Date Taking? Authorizing Provider  amLODipine (NORVASC) 10 MG tablet Take 1 tablet (10 mg total) by mouth daily. 01/03/18  Yes Patrecia Pour, Christean Grief, MD  carvedilol (COREG CR) 10 MG 24 hr capsule Take 1 capsule (10 mg total) by mouth daily. 01/04/18  Yes Patrecia Pour, Christean Grief, MD  chlorthalidone (HYGROTON) 50 MG  tablet Take 1 tablet (50 mg total) by mouth daily. 01/04/18  Yes Patrecia Pour, Christean Grief, MD  diphenhydrAMINE (BENADRYL) 50 MG tablet Take 0.5 tablets (25 mg total) by mouth every 6 (six) hours as needed for itching or allergies. 01/03/18  Yes Patrecia Pour, Christean Grief, MD  oxyCODONE (OXY IR/ROXICODONE) 5 MG immediate release tablet Take 1 tablet (5 mg total) by mouth every 6 (six) hours as needed for up to 5 days  for breakthrough pain. 01/03/18 01/08/18 Yes Doreatha Lew, MD  predniSONE (DELTASONE) 10 MG tablet Take 4 tablets for 3 days; Take 3 tablets for 4 days; Take 2 tablets for 3 days; Take 1 tablet for 4 days; then take half tablet daily. 01/03/18  Yes Patrecia Pour, Christean Grief, MD   BP (!) 162/125   Pulse 69   Temp (!) 97.5 F (36.4 C) (Oral)   Resp 12   Ht 5' 6" (1.676 m)   Wt 90.7 kg (200 lb)   LMP 12/29/2017   SpO2 99%   BMI 32.28 kg/m   Physical Exam:   Alert and dependent in some distress cicatrix healed facial scars no pallor no icterus abdomen soft nontender nondistended chest is clinically clear no added sound no lower extremity edema range of motion intact-tongue is very tender to touch on trying to palpate as well as scratch the surface of the tongue she does have severe pain and refuses to allow further exam although she is able to phonate and talk clearly with me she has submandibular discomfort not swelling  I have personally reviewed following labs and imaging studies  Labs:   Reviewed  Imaging studies:   Reviewed in detail with critical care Dr. Abbott Pao abscess rather large swelling of the tongue  Medical tests:   EKG independently reviewed: None  Test discussed with performing physician:  No  Decision to obtain old records:   Yes decided to do so-reviewed in detail  Review and summation of old records:   Yes I have reviewed EXTENSIVELY  Active Problems:   * No active hospital problems. *  Rheum wrk-up to date--referred to Dr. Earnest Conroy recently for EST care [facility]  ""ANA 1:2560 nucleolar pattern Elevated ESR  Telangiectasia Diffuse achiness Open sores Rash on faces healing with hyperpigmented scars Diffuse joint pain hands, wrists, elbows, shoulders, knees, hips.   Hx of septic R knee, January 2019. Frequent headaches Nose sores Mouth dryness Raynaud's symptoms Non-healing ulcer on L 3 finger and R 3 finger. Pt has positive ANA in very high titer in  nucleolar pattern. She has tried prednisone, plaquenil, MTX and chloroquine. She thinks that prednisone has worked the best.""   ----->at this visit consideration givne for plaquenil 200, also referred Kennieth Rad for ? Thalidomide [lupus rash]  Assessment/Plan  Tongue swelling query cause?  Hereditary angioedema versus?  Fungal infection secondary to immunosuppressed state and recent steroid use-treat for the same with clindamycin fluconazole ask ID to see in consult ENT appreciated in advance will place on stepdown to carefully monitor and give steroids IV Solu-Medrol 60 twice daily under the coverage of antibiotics-pain control will be with Toradol and we can also give oxycodone if she is unable to tolerate the pain-if needed we will escalate to morphine ENT input pending-Dr. Benjamine Mola will see in am 5/10  ADDEND-patient requesting dilaudid IV as works better and hard to swallow--changed orders-see mar  Undifferentiated connective tissue disease/lupus/discoid lupus  Overall pretty dismal prognosis--multiple infections and collections over the years of superinfections with high ANA titers  and mutli drug trials at [facility] --see last [facility] note Care everywhere Emergency room PA spoke with Dr. Ashley Royalty at [facility] who declined to admit the patient there as there would be nothing further to offer and apparently has mentioned to hold Plaquenil  [she is not on plaquenil?!please verify in am--not on MAR--Rx on last visit Dr. 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  Cytosis is either secondary to steroids or overt infection and may need further evaluation-would wait on ENT follow-up regarding management of the same and may need direct laryngoscopy at the bedside prior to further decision making  Proteinuria [new since off  ACE]-rpt UA in am--proteinuria prognotic poor sign-prefer not use ACE/ARB at this time-needs close careful OP nephro f/u [foll [facility] Nephro as above]  Chr pain-on Oxy at home--some psych overlay--child-like behaviour and reaction to what understandably severe pain.  ?2/2 gain--will judiciously defer to rounding md am 5/10 to re-eval needs and de-escalate to orals.  Need [place] opiod database review Getting UDS as well today [5/10]     Severity of Illness: The appropriate patient status for this patient is INPATIENT. Inpatient status is judged to be reasonable and necessary in order to provide the required intensity of service to ensure the patient's safety. The patient's presenting symptoms, physical exam findings, and initial radiographic and laboratory data in the context of their chronic comorbidities is felt to place them at high risk for further clinical deterioration. Furthermore, it is not anticipated that the patient will be medically stable for discharge from the hospital within 2 midnights of admission. The following factors support the patient status of inpatient.   " The patient's presenting symptoms include trismus, mouth pain, dysphagia. " The worrisome physical exam findings include above. " The initial radiographic and laboratory data are worrisome because of swelling and high risk for compromise. " The chronic co-morbidities include multiple.   * I certify that at the point of admission it is my clinical judgment that the patient will require inpatient hospital care spanning beyond 2 midnights from the point of admission due to high intensity of service, high risk for further deterioration and high frequency of surveillance required.*     DVT prophylaxis: Lovenox Code Status: Full Family Communication: None present Consults called: ENT, ID and curbside critical care  Time spent: 120 minutes minutes  CRITICAL CARE Performed by: Nita Sells   Total critical care  time: 70 minutes  Critical care time was exclusive of separately billable procedures and treating other patients.  Critical care was necessary to treat or prevent imminent or life-threatening deterioration.  Critical care was time spent personally by me on the following activities: development of treatment plan with patient and/or surrogate as well as nursing, discussions with consultants, evaluation of patient's response to treatment, examination of patient, obtaining history from patient or surrogate, ordering and performing treatments and interventions, ordering and review of laboratory studies, ordering and review of radiographic studies, pulse oximetry and re-evaluation of patient's condition.   01/06/2018, 2:14 PM

## 2018-01-06 NOTE — ED Notes (Signed)
EDPA Provider at bedside. 

## 2018-01-06 NOTE — ED Notes (Signed)
Patient transported to CT 

## 2018-01-06 NOTE — Progress Notes (Signed)
Patient seen and re-assessed-given oral meds which caused a worsening of odynopghagia, dysphagia States D Dilaudid seem to help the best in terms of pain control and management but is also asking for ice chips in the same breath Tells me that she had an appointment with her rheumatologist scheduled for today but had to miss it because of our emergency room visit   She is going to the stepdown unit just in case she has decompensation of respiratory status and needs emergent attention however at this time I feel she has stabilized to some degree she is phonating well talking clearly and I will start her on Dilaudid 0.5 every 3 as needed and let her have ice chips for now  I did discuss with Dr. Drucilla Schmidt of infectious disease who will see the patient-he recommends that we use Zyvox and discontinue vancomycin and clindamycin and we can continue to use fluconazole.  I will add nystatin swish and swallow if she is able to tolerate  Verneita Griffes, MD [facility] [phone]

## 2018-01-06 NOTE — ED Provider Notes (Signed)
21yF with persistent facial/neck swelling. Presumed ACEI induced angioedema. It has been held for several days now. Has been on antihistamines/steroids. Symptoms have not improved. If anything, they have progressed. CT today as below. Infectious less likely. Her dentition looks ok. Afebrile rectally. Has leukocytosis but has been on steroids.   Hx of lupus. Acquired c1-esterase deficiency? Will check additional labs.   She doesn't have stridor. She does have some trismus though and spitting at times. She needs to be observed very closely in monitored environment.    Virgel Manifold, MD 01/12/18 1154

## 2018-01-06 NOTE — ED Notes (Signed)
ED Provider at bedside. KOHUT

## 2018-01-06 NOTE — ED Notes (Signed)
ED TO INPATIENT HANDOFF REPORT  Name/Age/Gender Linda [name] 22 y.o. female  Code Status    Code Status Orders  (From admission, onward)        Start     Ordered   01/06/18 1417  Full code  Continuous     01/06/18 1417    Code Status History    Date Active Date Inactive Code Status Order ID Comments User Context   01/02/2018 0817 01/03/2018 2024 Full Code [id number]  Radene Gunning, NP ED   12/24/2017 2005 12/26/2017 1901 Full Code [id number]  Velvet Bathe, MD Inpatient   11/29/2017 1740 12/04/2017 1719 Full Code [id number]  Shelly Coss, MD ED   11/22/2017 0955 11/25/2017 1422 Full Code [id number]  Patrecia Pour, MD Inpatient   08/02/2017 2005 08/09/2017 1753 Full Code [id number]  Steve Rattler, DO ED   04/20/2017 2033 04/22/2017 2105 Full Code [id number]  Carlyle Dolly, MD Inpatient   04/09/2017 1351 04/14/2017 2123 Full Code [id number]  Smiley Houseman, MD ED      Home/SNF/Other Home  Chief Complaint Tongue Swelling  Level of Care/Admitting Diagnosis ED Disposition    ED Disposition Condition [facility] Area: [facility] [[id number]]  Level of Care: Stepdown [14]  Admit to SDU based on following criteria: Severe physiological/psychological symptoms:  Any diagnosis requiring assessment & intervention at least every 4 hours on an ongoing basis to obtain desired patient outcomes including stability and rehabilitation  Diagnosis: Sepsis [facility]) [[id number]]  Admitting Physician: Nita Sells [phone]  Attending Physician: Nita Sells [phone]  Estimated length of stay: 3 - 4 days  Certification:: I certify this patient will need inpatient services for at least 2 midnights  PT Class (Do Not Modify): Inpatient [101]  PT Acc Code (Do Not Modify): Private [1]       Medical History Past Medical History:  Diagnosis Date  . Acute kidney injury ([place])   . Angioedema   . Hypertension   . Lupus ([place])   . Migraines   . Mixed  connective tissue disease ([facility])     Allergies Allergies  Allergen Reactions  . Lisinopril Anaphylaxis    Angioedema  . Vicodin [Hydrocodone-Acetaminophen] Itching and Rash    IV Location/Drains/Wounds Patient Lines/Drains/Airways Status   Active Line/Drains/Airways    Name:   Placement date:   Placement time:   Site:   Days:   Peripheral IV 01/06/18 Left Forearm   01/06/18    0755    Forearm   less than 1          Labs/Imaging Results for orders placed or performed during the hospital encounter of 01/06/18 (from the past 48 hour(s))  CBC with Differential     Status: Abnormal   Collection Time: 01/06/18  7:58 AM  Result Value Ref Range   WBC 20.0 (H) 4.0 - 10.5 K/uL   RBC 4.44 3.87 - 5.11 MIL/uL   Hemoglobin 14.0 12.0 - 15.0 g/dL   HCT 42.4 36.0 - 46.0 %   MCV 95.5 78.0 - 100.0 fL   MCH 31.5 26.0 - 34.0 pg   MCHC 33.0 30.0 - 36.0 g/dL   RDW 15.0 11.5 - 15.5 %   Platelets 139 (L) 150 - 400 K/uL    Comment: REPEATED TO VERIFY SPECIMEN CHECKED FOR CLOTS PLATELET COUNT CONFIRMED BY SMEAR    Neutrophils Relative % 61 %   Neutro Abs 12.0 1.7 - 7.7 K/uL   Lymphocytes Relative 24 %  Lymphs Abs 4.9 0.7 - 4.0 K/uL   Monocytes Relative 15 %   Monocytes Absolute 3.1 0.1 - 1.0 K/uL   Eosinophils Relative 0 %   Eosinophils Absolute 0.1 0.0 - 0.7 K/uL   Basophils Relative 0 %   Basophils Absolute 0.0 0.0 - 0.1 K/uL   WBC Morphology MILD LEFT SHIFT (1-5% METAS, OCC MYELO, OCC BANDS)     Comment: Performed at [facility], [place] [address], [place], [place] [address]  Basic metabolic panel     Status: Abnormal   Collection Time: 01/06/18  7:58 AM  Result Value Ref Range   Sodium 134 (L) 135 - 145 mmol/L   Potassium 3.5 3.5 - 5.1 mmol/L   Chloride 96 (L) 101 - 111 mmol/L   CO2 24 22 - 32 mmol/L   Glucose, Bld 76 65 - 99 mg/dL   BUN 24 (H) 6 - 20 mg/dL   Creatinine, Ser 1.58 (H) 0.44 - 1.00 mg/dL   Calcium 9.7 8.9 - 10.3 mg/dL   GFR calc non Af Amer 46 (L)  >60 mL/min   GFR calc Af Amer 53 (L) >60 mL/min    Comment: (NOTE) The eGFR has been calculated using the CKD EPI equation. This calculation has not been validated in all clinical situations. eGFR's persistently <60 mL/min signify possible Chronic Kidney Disease.    Anion gap 14 5 - 15    Comment: Performed at [facility], [place] [address] [place], [place] [address]  I-Stat beta hCG blood, ED     Status: None   Collection Time: 01/06/18  8:07 AM  Result Value Ref Range   I-stat hCG, quantitative <5.0 <5 mIU/mL   Comment 3            Comment:   GEST. AGE      CONC.  (mIU/mL)   <=1 WEEK        5 - 50     2 WEEKS       50 - 500     3 WEEKS       100 - 10,000     4 WEEKS     1,000 - 30,000        FEMALE AND NON-PREGNANT FEMALE:     LESS THAN 5 mIU/mL    Ct Soft Tissue Neck W Contrast  Result Date: 01/06/2018 CLINICAL DATA:  Difficulty swallowing with tongue swelling. EXAM: CT NECK WITH CONTRAST TECHNIQUE: Multidetector CT imaging of the neck was performed using the standard protocol following the bolus administration of intravenous contrast. CONTRAST:  16m ISOVUE-300 IOPAMIDOL (ISOVUE-300) INJECTION 61% COMPARISON:  None. FINDINGS: Pharynx and larynx: The tongue fills the oral cavity and is somewhat low-density. There is mild edematous appearance at the floor of mouth, best seen on coronal reformats both superficial and deep to the mylohyoid. This could reflect infection or angioedema. No laryngeal swelling or airway narrowing. Salivary glands: Negative. No asymmetric enlargement or enhancement of the sublingual glands. Thyroid: Negative Lymph nodes: None enlarged or abnormal density. Vascular: Negative Limited intracranial: Negative Visualized orbits: Negative Mastoids and visualized paranasal sinuses: Clear Skeleton: No acute or aggressive finding. Upper chest: Negative Other: There is subcutaneous calcification along the bilateral cheek and left submandibular region.  IMPRESSION: 1. Mild edema in the floor of mouth and submental region. This could reflect deep space infection; there is no visible underlying odontogenic or salivary source. No abscess. Patient also has history of angioedema. 2. Low-density appearance of the tongue, symmetry  favoring fatty infiltration over edema. 3. Negative for laryngeal edema or airway stenosis. 4. Dermal based calcifications in the bilateral face which is nonspecific. Question if this is manifestation of patient's mixed connective tissue disease. Electronically Signed   By: Monte Fantasia M.D.   On: 01/06/2018 10:19    Pending Labs Unresulted Labs (From admission, onward)   Start     Ordered   01/07/18 0500  Comprehensive metabolic panel  Tomorrow morning,   R     01/06/18 1417   01/07/18 0500  CBC  Tomorrow morning,   R     01/06/18 1417   01/07/18 0500  Protime-INR  Tomorrow morning,   R     01/06/18 1417   01/06/18 1417  TSH  Once,   R     01/06/18 1417   01/06/18 1105  C1 Esterase Inhibitor  Once,   R     01/06/18 1104   01/06/18 1105  C4 complement  Once,   R     01/06/18 1104   01/06/18 1105  Complement component c1q  Once,   R     01/06/18 1104      Vitals/Pain Today's Vitals   01/06/18 1108 01/06/18 1202 01/06/18 1300 01/06/18 1400  BP: (!) 168/129 (!) 151/117 (!) 143/105 (!) 162/125  Pulse: 89 84 68 69  Resp: 20 (!) '21 13 16  '$ Temp:  99.2 F (37.3 C)    TempSrc:  Oral    SpO2: 100% 99% 97% 99%  Weight:      PainSc:        Isolation Precautions No active isolations  Medications Medications  sodium chloride 0.9 % bolus 1,000 mL (0 mLs Intravenous Stopped 01/06/18 0929)    And  0.9 %  sodium chloride infusion ( Intravenous New Bag/Given 01/06/18 1010)  ondansetron (ZOFRAN) injection 4 mg (4 mg Intravenous Refused 01/06/18 0843)  methylPREDNISolone sodium succinate (SOLU-MEDROL) 125 mg/2 mL injection 60 mg (60 mg Intravenous Given 01/06/18 1528)  dextrose 5 % solution (has no administration in time  range)  ketorolac (TORADOL) 30 MG/ML injection 30 mg (30 mg Intravenous Given 01/06/18 1531)  oxyCODONE (Oxy IR/ROXICODONE) immediate release tablet 5 mg (5 mg Oral Given 01/06/18 1531)  fluconazole (DIFLUCAN) IVPB 100 mg (has no administration in time range)  vancomycin (VANCOCIN) 1,750 mg in sodium chloride 0.9 % 500 mL IVPB (1,750 mg Intravenous New Bag/Given 01/06/18 1527)  vancomycin (VANCOCIN) IVPB 1000 mg/200 mL premix (has no administration in time range)  diphenhydrAMINE (BENADRYL) injection 25 mg (25 mg Intravenous Given 01/06/18 0830)  EPINEPHrine (EPI-PEN) injection 0.3 mg (0.3 mg Intramuscular Given 01/06/18 0839)  dexamethasone (DECADRON) injection 10 mg (10 mg Intravenous Given 01/06/18 0837)  famotidine (PEPCID) IVPB 20 mg premix (0 mg Intravenous Stopped 01/06/18 0931)  clindamycin (CLEOCIN) IVPB 600 mg (0 mg Intravenous Stopped 01/06/18 0930)  fentaNYL (SUBLIMAZE) injection 50 mcg (50 mcg Intravenous Given 01/06/18 0833)  iopamidol (ISOVUE-300) 61 % injection 75 mL (75 mLs Intravenous Contrast Given 01/06/18 0950)  morphine 4 MG/ML injection 6 mg (6 mg Intravenous Given 01/06/18 1017)  HYDROmorphone (DILAUDID) injection 0.5 mg (0.5 mg Intravenous Given 01/06/18 1159)    Mobility walks

## 2018-01-07 DIAGNOSIS — Z8619 Personal history of other infectious and parasitic diseases: Secondary | ICD-10-CM

## 2018-01-07 DIAGNOSIS — R220 Localized swelling, mass and lump, head: Secondary | ICD-10-CM

## 2018-01-07 DIAGNOSIS — Z8269 Family history of other diseases of the musculoskeletal system and connective tissue: Secondary | ICD-10-CM

## 2018-01-07 DIAGNOSIS — T738XXA Other effects of deprivation, initial encounter: Secondary | ICD-10-CM

## 2018-01-07 DIAGNOSIS — F17210 Nicotine dependence, cigarettes, uncomplicated: Secondary | ICD-10-CM

## 2018-01-07 DIAGNOSIS — Z8739 Personal history of other diseases of the musculoskeletal system and connective tissue: Secondary | ICD-10-CM

## 2018-01-07 DIAGNOSIS — D72829 Elevated white blood cell count, unspecified: Secondary | ICD-10-CM

## 2018-01-07 DIAGNOSIS — R21 Rash and other nonspecific skin eruption: Secondary | ICD-10-CM

## 2018-01-07 DIAGNOSIS — Z888 Allergy status to other drugs, medicaments and biological substances status: Secondary | ICD-10-CM

## 2018-01-07 DIAGNOSIS — T783XXA Angioneurotic edema, initial encounter: Secondary | ICD-10-CM

## 2018-01-07 DIAGNOSIS — K1370 Unspecified lesions of oral mucosa: Secondary | ICD-10-CM

## 2018-01-07 DIAGNOSIS — Z885 Allergy status to narcotic agent status: Secondary | ICD-10-CM

## 2018-01-07 LAB — COMPREHENSIVE METABOLIC PANEL
ALT: 15 U/L (ref 14–54)
AST: 39 U/L (ref 15–41)
Albumin: 3.3 g/dL — ABNORMAL LOW (ref 3.5–5.0)
Alkaline Phosphatase: 59 U/L (ref 38–126)
Anion gap: 14 (ref 5–15)
BUN: 24 mg/dL — ABNORMAL HIGH (ref 6–20)
CO2: 23 mmol/L (ref 22–32)
Calcium: 9.1 mg/dL (ref 8.9–10.3)
Chloride: 95 mmol/L — ABNORMAL LOW (ref 101–111)
Creatinine, Ser: 1.47 mg/dL — ABNORMAL HIGH (ref 0.44–1.00)
GFR calc Af Amer: 58 mL/min — ABNORMAL LOW (ref >60)
GFR calc non Af Amer: 50 mL/min — ABNORMAL LOW (ref >60)
Glucose, Bld: 141 mg/dL — ABNORMAL HIGH (ref 65–99)
Potassium: 4.6 mmol/L (ref 3.5–5.1)
Sodium: 132 mmol/L — ABNORMAL LOW (ref 135–145)
Total Bilirubin: 1.6 mg/dL — ABNORMAL HIGH (ref 0.3–1.2)
Total Protein: 7.6 g/dL (ref 6.5–8.1)

## 2018-01-07 LAB — CBC
HCT: 41.2 % (ref 36.0–46.0)
Hemoglobin: 13.6 g/dL (ref 12.0–15.0)
MCH: 31.1 pg (ref 26.0–34.0)
MCHC: 33 g/dL (ref 30.0–36.0)
MCV: 94.1 fL (ref 78.0–100.0)
Platelets: 117 10*3/uL — ABNORMAL LOW (ref 150–400)
RBC: 4.38 MIL/uL (ref 3.87–5.11)
RDW: 14.8 % (ref 11.5–15.5)
WBC: 18.3 10*3/uL — ABNORMAL HIGH (ref 4.0–10.5)

## 2018-01-07 LAB — CULTURE, BLOOD (ROUTINE X 2)
Culture: NO GROWTH
Culture: NO GROWTH
Special Requests: ADEQUATE
Special Requests: ADEQUATE

## 2018-01-07 LAB — RAPID URINE DRUG SCREEN, HOSP PERFORMED
Amphetamines: NOT DETECTED
Barbiturates: NOT DETECTED
Benzodiazepines: NOT DETECTED
Cocaine: NOT DETECTED
Opiates: POSITIVE — AB
Tetrahydrocannabinol: NOT DETECTED

## 2018-01-07 LAB — PROTIME-INR
INR: 1.06
Prothrombin Time: 13.8 seconds (ref 11.4–15.2)

## 2018-01-07 LAB — URINALYSIS, ROUTINE W REFLEX MICROSCOPIC
Bacteria, UA: NONE SEEN
Bilirubin Urine: NEGATIVE
Glucose, UA: 50 mg/dL — AB
Ketones, ur: NEGATIVE mg/dL
Nitrite: NEGATIVE
Protein, ur: NEGATIVE mg/dL
Specific Gravity, Urine: 1.004 — ABNORMAL LOW (ref 1.005–1.030)
Trichomonas, UA: NONE SEEN
WBC, UA: >50 WBC/hpf — ABNORMAL HIGH (ref 0–5)
pH: 7 (ref 5.0–8.0)

## 2018-01-07 LAB — C1 ESTERASE INHIBITOR: C1INH SerPl-mCnc: 44 mg/dL — ABNORMAL HIGH (ref 21–39)

## 2018-01-07 LAB — C4 COMPLEMENT: Complement C4, Body Fluid: 25 mg/dL (ref 14–44)

## 2018-01-07 MED ORDER — MORPHINE SULFATE (CONCENTRATE) 10 MG/0.5ML PO SOLN
20.0000 mg | ORAL | Status: DC | PRN
  Administered 2018-01-07 – 2018-01-08 (×5): 20 mg via ORAL
  Filled 2018-01-07 (×5): qty 1

## 2018-01-07 MED ORDER — MORPHINE SULFATE (CONCENTRATE) 10 MG/0.5ML PO SOLN
20.0000 mg | ORAL | Status: DC | PRN
  Filled 2018-01-07: qty 1

## 2018-01-07 NOTE — Progress Notes (Signed)
Patient complaining of uncontrolled pain 10/10 with elevated systolic BP 106'G.  RN administered Toradol 30mg  and Hydralazine 5mg .  MD updated on patients condition, MD to place orders.

## 2018-01-07 NOTE — Progress Notes (Addendum)
[facility] PROGRESS NOTE    Progress Note  Linda [name]  NAT:[id number] DOB: 1996/01/26 DOA: 01/06/2018 PCP: Linda Bihari, MD     Brief Narrative:   Linda [name] is an 22 y.o. female past medical history of undifferentiated lupus, hypertension recently discharged from the hospital on 01/03/2018 for presumed ACE inhibitor angioedema, sent home on steroids tapered came into the hospital complaining of tongue swelling and pain  Assessment/Plan:   Leukocytosis/ Sepsis (HCC)/Tongue swelling: CT scan done on 01/06/2018 showed mild edema of the floor of mouth and subsegmental region no appreciated abscess, there is also a low-density appearance of the tongue I will discontinue all abx Awaiting ENT recommendations, will continue IV steroids. Started on Toradol and IV narcotics for pain Currently n.p.o. Leukocytosis unlikely due to infections, she has been on steroids.  Transfer to the floor.  I am not convinced she has an infection this could all be rebound from the steroid taper.  Lupus: Attempt was made to transfer to [facility] which was denied.  Essential hypertension: Blood pressure borderline high continue to monitor.  DVT prophylaxis: SCD Family Communication: None Disposition Plan/Barrier to D/C: transfer to med surg Code Status:     Code Status Orders  (From admission, onward)        Start     Ordered   01/06/18 1417  Full code  Continuous     01/06/18 1417    Code Status History    Date Active Date Inactive Code Status Order ID Comments User Context   01/02/2018 0817 01/03/2018 2024 Full Code [id number]  Radene Gunning, NP ED   12/24/2017 2005 12/26/2017 1901 Full Code [id number]  Velvet Bathe, MD Inpatient   11/29/2017 1740 12/04/2017 1719 Full Code [id number]  Shelly Coss, MD ED   11/22/2017 0955 11/25/2017 1422 Full Code [id number]  Patrecia Pour, MD Inpatient   08/02/2017 2005 08/09/2017 1753 Full Code [id number]  Steve Rattler, DO ED   04/20/2017  2033 04/22/2017 2105 Full Code [id number]  Carlyle Dolly, MD Inpatient   04/09/2017 1351 04/14/2017 2123 Full Code [id number]  Smiley Houseman, MD ED        IV Access:    Peripheral IV   Procedures and diagnostic studies:   Ct Soft Tissue Neck W Contrast  Result Date: 01/06/2018 CLINICAL DATA:  Difficulty swallowing with tongue swelling. EXAM: CT NECK WITH CONTRAST TECHNIQUE: Multidetector CT imaging of the neck was performed using the standard protocol following the bolus administration of intravenous contrast. CONTRAST:  72mL ISOVUE-300 IOPAMIDOL (ISOVUE-300) INJECTION 61% COMPARISON:  None. FINDINGS: Pharynx and larynx: The tongue fills the oral cavity and is somewhat low-density. There is mild edematous appearance at the floor of mouth, best seen on coronal reformats both superficial and deep to the mylohyoid. This could reflect infection or angioedema. No laryngeal swelling or airway narrowing. Salivary glands: Negative. No asymmetric enlargement or enhancement of the sublingual glands. Thyroid: Negative Lymph nodes: None enlarged or abnormal density. Vascular: Negative Limited intracranial: Negative Visualized orbits: Negative Mastoids and visualized paranasal sinuses: Clear Skeleton: No acute or aggressive finding. Upper chest: Negative Other: There is subcutaneous calcification along the bilateral cheek and left submandibular region. IMPRESSION: 1. Mild edema in the floor of mouth and submental region. This could reflect deep space infection; there is no visible underlying odontogenic or salivary source. No abscess. Patient also has history of angioedema. 2. Low-density appearance of the tongue, symmetry favoring fatty infiltration over edema. 3. Negative for laryngeal  edema or airway stenosis. 4. Dermal based calcifications in the bilateral face which is nonspecific. Question if this is manifestation of patient's mixed connective tissue disease. Electronically Signed   By: Monte Fantasia M.D.   On: 01/06/2018 10:19     Medical Consultants:    None.  Anti-Infectives:   IV fluconazole.  Subjective:    [name] that she is still in pain.  Objective:    Vitals:   01/07/18 0400 01/07/18 0500 01/07/18 0600 01/07/18 0758  BP: (!) 163/119 (!) 178/128 (!) 162/125   Pulse:      Resp: 15 12 12    Temp:    (!) 97.5 F (36.4 C)  TempSrc:    Oral  SpO2: 100% 99% 99%   Weight:      Height:        Intake/Output Summary (Last 24 hours) at 01/07/2018 0816 Last data filed at 01/07/2018 0700 Gross per 24 hour  Intake 7512.5 ml  Output -  Net 7512.5 ml   Filed Weights   01/06/18 1017  Weight: 90.7 kg (200 lb)    Exam: General exam: In no acute distress. HEENT:Her tongue is swollen and bruised tender to palpation with foul-smelling with what it seems like oral thrush Respiratory system: Good air movement and clear to auscultation. Cardiovascular system: S1 & S2 heard, RRR.  Gastrointestinal system: Abdomen is nondistended, soft and nontender.  Central nervous system: Alert and oriented. No focal neurological deficits. Extremities: No pedal edema. Skin: No rashes, lesions or ulcers Psychiatry: Judgement and insight appear normal. Mood & affect appropriate.    Data Reviewed:    Labs: Basic Metabolic Panel: Recent Labs  Lab 01/02/18 0605 01/03/18 0549 01/06/18 0758 01/07/18 0317  NA 132* 136 134* 132*  K 3.6 4.0 3.5 4.6  CL 96* 103 96* 95*  CO2 22 21* 24 23  GLUCOSE 92 124* 76 141*  BUN 16 18 24* 24*  CREATININE 1.52* 1.21* 1.58* 1.47*  CALCIUM 9.0 9.1 9.7 9.1   GFR Estimated Creatinine Clearance: 68.1 mL/min (A) (by C-G formula based on SCr of 1.47 mg/dL (H)). Liver Function Tests: Recent Labs  Lab 01/07/18 0317  AST 39  ALT 15  ALKPHOS 59  BILITOT 1.6*  PROT 7.6  ALBUMIN 3.3*   No results for input(s): LIPASE, AMYLASE in the last 168 hours. No results for input(s): AMMONIA in the last 168 hours. Coagulation  profile Recent Labs  Lab 01/07/18 0317  INR 1.06    CBC: Recent Labs  Lab 01/02/18 0605 01/03/18 0549 01/06/18 0758 01/07/18 0317  WBC 15.4* 19.1* 20.0* 18.3*  NEUTROABS 8.4*  --  12.0  --   HGB 13.4 14.2 14.0 13.6  HCT 40.3 43.9 42.4 41.2  MCV 96.9 97.3 95.5 94.1  PLT 172 145* 139* 117*   Cardiac Enzymes: Recent Labs  Lab 01/02/18 1832 01/02/18 2349 01/03/18 0549  TROPONINI 0.05* 0.05* 0.05*   BNP (last 3 results) No results for input(s): PROBNP in the last 8760 hours. CBG: No results for input(s): GLUCAP in the last 168 hours. D-Dimer: No results for input(s): DDIMER in the last 72 hours. Hgb A1c: No results for input(s): HGBA1C in the last 72 hours. Lipid Profile: No results for input(s): CHOL, HDL, LDLCALC, TRIG, CHOLHDL, LDLDIRECT in the last 72 hours. Thyroid function studies: Recent Labs    01/06/18 1649  TSH 0.477   Anemia work up: No results for input(s): VITAMINB12, FOLATE, FERRITIN, TIBC, IRON, RETICCTPCT in the last 72 hours.  Sepsis Labs: Recent Labs  Lab 01/02/18 0605 01/02/18 0809 01/03/18 0549 01/06/18 0758 01/07/18 0317  WBC 15.4*  --  19.1* 20.0* 18.3*  LATICACIDVEN  --  1.31  --   --   --    Microbiology Recent Results (from the past 240 hour(s))  Blood culture (routine x 2)     Status: None (Preliminary result)   Collection Time: 01/02/18  6:05 AM  Result Value Ref Range Status   Specimen Description   Final    BLOOD RIGHT ARM Performed at [facility] [address] [place], [place] [address]    Special Requests   Final    BOTTLES DRAWN AEROBIC AND ANAEROBIC Blood Culture adequate volume Performed at [facility] [address], [place], [place] [address]    Culture   Final    NO GROWTH 4 DAYS Performed at [facility], [place] [address], [place], [place] [address]    Report Status PENDING  Incomplete  Blood culture (routine x 2)     Status: None (Preliminary result)    Collection Time: 01/02/18  6:25 AM  Result Value Ref Range Status   Specimen Description   Final    BLOOD LEFT ARM Performed at [facility] [address] [place], [place] [address]    Special Requests   Final    BOTTLES DRAWN AEROBIC AND ANAEROBIC Blood Culture adequate volume Performed at [facility] [address] [place], [place] [address]    Culture   Final    NO GROWTH 4 DAYS Performed at [facility], [place] [address], [place], [place] [address]    Report Status PENDING  Incomplete  MRSA PCR Screening     Status: None   Collection Time: 01/02/18  4:25 PM  Result Value Ref Range Status   MRSA by PCR NEGATIVE NEGATIVE Final    Comment:        The GeneXpert MRSA Assay (FDA approved for NASAL specimens only), is one component of a comprehensive MRSA colonization surveillance program. It is not intended to diagnose MRSA infection nor to guide or monitor treatment for MRSA infections. Performed at [facility], [place] [address], [place], [place] [address]      Medications:   . methylPREDNISolone (SOLU-MEDROL) injection  60 mg Intravenous Q12H  . nystatin  5 mL Oral QID  . ondansetron (ZOFRAN) IV  4 mg Intravenous Once   Continuous Infusions: . sodium chloride 125 mL/hr at 01/07/18 0700  . dextrose 100 mL/hr at 01/07/18 0700  . fluconazole (DIFLUCAN) IV Stopped (01/06/18 1748)  . linezolid (ZYVOX) IV Stopped (01/06/18 2341)     LOS: 1 day   Charlynne Cousins  [facility] Pager [phone]  *Please refer to [email or web address], password [email or web address] to get updated schedule on who will round on this patient, as hospitalists switch teams weekly. If 7PM-7AM, please contact night-coverage at [email or web address], password [email or web address] for any overnight needs.  01/07/2018, 8:16 AM

## 2018-01-07 NOTE — Progress Notes (Signed)
Subjective: Tongue pain and swelling. Slightly improved from yesterday. No breathing difficulty.  Objective: Vital signs in last 24 hours: Temp:  [97.4 F (36.3 C)-99.2 F (37.3 C)] 97.5 F (36.4 C) (05/10 0758) Pulse Rate:  [63-84] 69 (05/09 2000) Resp:  [12-22] 14 (05/10 1000) BP: (132-200)/(91-141) 132/91 (05/10 1000) SpO2:  [85 %-100 %] 99 % (05/10 1000)  Physical Exam Constitutional: She isoriented to person, place, and time. She appearswell-developedand well-nourished. Head:Normocephalic. Ears: Normal auricles and external auditory canals. Nose: Congested mucosa over the nasal septum and turbinates.  Mouth: Moderate tongue edema, with patches of dark and tender mucosa.  The rest of the oral cavity and oral pharyngeal mucosa are normal. Eyes:Conjunctivaeare normal.  Pupils are equal round and reactive to light. Neck:Neck supple.  No significant edema.  No lymphadenopathy or mass.   Cardiovascular:Normal rate,regular rhythmand normal heart sounds.  Pulmonary/Chest:Effort normaland breath sounds normal. Norespiratory distress. She hasno wheezes. She hasno rales.No stridor Musculoskeletal: She exhibits noedema.  Neurological: She isalertand oriented to person, place, and time.  Skin: Skin iswarmand dry.  Psychiatric: She has anormal mood and affect. Herbehavior is normal.  Procedure:  Flexible Fiberoptic Laryngoscopy Anesthesia: Topical oxymetazoline and lidocaine Description: Risks, benefits, and alternatives of flexible endoscopy were explained to the patient. Specific mention was made of the risk of throat numbness with difficulty swallowing, possible bleeding from the nose and mouth, and pain from the procedure.  The patient gave oral consent to proceed.  The nasal cavities were decongested and anesthetised with a combination of oxymetazoline and 4% lidocaine solution.  The flexible scope was inserted into the right nasal cavity and advanced towards  the nasopharynx.  Visualized mucosa over the turbinates and septum were congested.  The nasopharynx was clear.  Oropharyngeal walls were symmetric and mobile without lesion, mass, or edema.  Hypopharynx was also without  lesion or edema.  Larynx was mobile without lesions.  No lesions or asymmetry in the supraglottic larynx.  Arytenoid mucosa was normal.   True vocal folds were pale yellow and without mass or lesion.     Recent Labs    01/06/18 0758 01/07/18 0317  WBC 20.0* 18.3*  HGB 14.0 13.6  HCT 42.4 41.2  PLT 139* 117*   Recent Labs    01/06/18 0758 01/07/18 0317  NA 134* 132*  K 3.5 4.6  CL 96* 95*  CO2 24 23  GLUCOSE 76 141*  BUN 24* 24*  CREATININE 1.58* 1.47*  CALCIUM 9.7 9.1    Medications:  I have reviewed the patient's current medications. Scheduled: . methylPREDNISolone (SOLU-MEDROL) injection  60 mg Intravenous Q12H  . nystatin  5 mL Oral QID  . ondansetron (ZOFRAN) IV  4 mg Intravenous Once   ZYS:[id number], HYDROmorphone (DILAUDID) injection, ketorolac  Assessment/Plan: Tongue edema with patches of discolored (possibly necrotic) areas. The pathology is limited to the tongue with no laryngeal involvement. Unclear the cause of her tongue pathology: Vasculitis (autoimmune) vs infectious. Continue with IV steroid. Consider rheumatology consult.  No surgical treatment option at this time.   LOS: 1 day   Su W Teoh 01/07/2018, 12:00 PM

## 2018-01-07 NOTE — Consult Note (Signed)
Date of Admission:  01/06/2018          Reason for Consult: Angioedema and tongue lesion    Referring Provider: Dr. Verlon Au   Assessment: 1. Tongue lesion likely due to underlying vasculitic condition and not an infectino 2. Possible thrush but this is NOT driving this pathology 3. Angioedema 4. Hx of MCTD followed as a child at [facility] and now at [facility] and Staph aureus soft tissue infection in late April Plan: 1. DC antibiotics 2. DC steroids 3. Agree with ENT consult 4. She needs to be seen Rheumatology if stable for DC as outpatient otherwise consider transfer to [facility] 5. We will sign off for now please call with further questions.  Active Problems:   Primary hypertension   Leukocytosis   Sepsis (HCC)   Tongue swelling   Scheduled Meds: . methylPREDNISolone (SOLU-MEDROL) injection  60 mg Intravenous Q12H  . nystatin  5 mL Oral QID  . ondansetron (ZOFRAN) IV  4 mg Intravenous Once   Continuous Infusions: . sodium chloride 125 mL/hr at 01/07/18 0700  . dextrose 100 mL/hr at 01/07/18 1449  . fluconazole (DIFLUCAN) IV 100 mg (01/07/18 1538)   PRN Meds:.hydrALAZINE, HYDROmorphone (DILAUDID) injection, ketorolac  HPI: Linda [name] is a 22 y.o. female history of MCTD followed at [facility] a s child, now just recently established at [facility] with question that she might have discoid lupus. She had been [facility] to the [facility] through the 6[facility] with what was thought to be ACE I induced angioedema and DC home on steroid taper. She returned with worsening of her tongue swelling as steroids were tapered. On admission this time she had darkened lesion on tongue. CT neck done which showed edema and inflammation but no abscess.   She had recent admission with purulent soft tissue infection with GAS and Staph aureus in late April. She was seen by Dr. Doreatha Martin who did not feel that the infection involved the joint nor that it warranted surgical  intervention.  She was given systemic IV antibiotics and dc on doxy which would not have covered her GAS infection well.  She has numerous skin lesions throughout which I suspect are largely due to her MCTD and vasculitis.     Review of Systems: Review of Systems  Constitutional: Positive for fever and malaise/fatigue. Negative for chills, diaphoresis and weight loss.  HENT: Positive for sore throat. Negative for congestion, hearing loss and tinnitus.   Eyes: Negative for blurred vision and double vision.  Respiratory: Negative for cough, sputum production, shortness of breath and wheezing.   Cardiovascular: Negative for chest pain, palpitations and leg swelling.  Gastrointestinal: Negative for abdominal pain, blood in stool, constipation, diarrhea, heartburn, melena, nausea and vomiting.  Genitourinary: Negative for dysuria, flank pain and hematuria.  Musculoskeletal: Positive for joint pain and myalgias. Negative for back pain and falls.  Skin: Negative for itching and rash.  Neurological: Negative for dizziness, sensory change, focal weakness, loss of consciousness, weakness and headaches.  Endo/Heme/Allergies: Does not bruise/bleed easily.  Psychiatric/Behavioral: Negative for depression, memory loss and suicidal ideas. The patient is not nervous/anxious.     Past Medical History:  Diagnosis Date  . Acute kidney injury ([place])   . Angioedema   . Hypertension   . Lupus ([place])   . Migraines   . Mixed connective tissue disease ([place])     Social History   Tobacco Use  . Smoking status: Current Every Day Smoker  Packs/day: 0.00    Types: Cigarettes, Cigars    Start date: 02/11/2015  . Smokeless tobacco: Never Used  Substance Use Topics  . Alcohol use: No    Alcohol/week: 0.0 oz    Comment: social  . Drug use: No    Family History  Problem Relation Age of Onset  . Lupus Mother   . Hypertension Mother   . Kidney disease Mother   . Diabetes Maternal Grandmother     Allergies  Allergen Reactions  . Lisinopril Anaphylaxis    Angioedema  . Vicodin [Hydrocodone-Acetaminophen] Itching and Rash    OBJECTIVE: Blood pressure (!) 156/113, pulse 69, temperature (!) 97.4 F (36.3 C), temperature source Oral, resp. rate 16, height 5\' 6"  (1.676 m), weight 200 lb (90.7 kg), last menstrual period 12/29/2017, SpO2 (!) 88 %.  Physical Exam  Constitutional: She is oriented to person, place, and time. No distress.  HENT:  Head: Normocephalic and atraumatic.  Right Ear: External ear normal.  Left Ear: External ear normal.  Nose: Nose normal.  Mouth/Throat: Posterior oropharyngeal edema and posterior oropharyngeal erythema present. No oropharyngeal exudate.  Eyes: Pupils are equal, round, and reactive to light. Conjunctivae and EOM are normal. No scleral icterus.  Neck: Normal range of motion. Neck supple. Edema present.  Cardiovascular: Normal rate, regular rhythm and normal heart sounds. Exam reveals no gallop and no friction rub.  No murmur heard. Pulmonary/Chest: Effort normal and breath sounds normal. No respiratory distress. She has no wheezes. She has no rales.  Abdominal: Soft. Bowel sounds are normal. She exhibits no distension. There is no tenderness. There is no rebound.  Musculoskeletal: Normal range of motion. She exhibits edema. She exhibits no tenderness.  Lymphadenopathy:    She has no cervical adenopathy.  Neurological: She is alert and oriented to person, place, and time. Coordination normal.  Skin: Skin is warm and dry. Rash noted. She is not diaphoretic. No erythema. No pallor.  Psychiatric: She has a normal mood and affect. Her behavior is normal. Judgment and thought content normal.    OP 01/07/2018:      Lesions on skin :      Lab Results Lab Results  Component Value Date   WBC 18.3 (H) 01/07/2018   HGB 13.6 01/07/2018   HCT 41.2 01/07/2018   MCV 94.1 01/07/2018   PLT 117 (L) 01/07/2018    Lab Results  Component Value  Date   CREATININE 1.47 (H) 01/07/2018   BUN 24 (H) 01/07/2018   NA 132 (L) 01/07/2018   K 4.6 01/07/2018   CL 95 (L) 01/07/2018   CO2 23 01/07/2018    Lab Results  Component Value Date   ALT 15 01/07/2018   AST 39 01/07/2018   ALKPHOS 59 01/07/2018   BILITOT 1.6 (H) 01/07/2018     Microbiology: Recent Results (from the past 240 hour(s))  Blood culture (routine x 2)     Status: None   Collection Time: 01/02/18  6:05 AM  Result Value Ref Range Status   Specimen Description   Final    BLOOD RIGHT ARM Performed at [facility], [place] [address], [place], [place] [address]    Special Requests   Final    BOTTLES DRAWN AEROBIC AND ANAEROBIC Blood Culture adequate volume Performed at [facility] [address], [place], [place] [address]    Culture   Final    NO GROWTH 5 DAYS Performed at [facility], [place],  [place] [address]    Report Status 01/07/2018 FINAL  Final  Blood culture (routine x 2)     Status: None   Collection Time: 01/02/18  6:25 AM  Result Value Ref Range Status   Specimen Description   Final    BLOOD LEFT ARM Performed at [facility] [address] [place], [place] [address]    Special Requests   Final    BOTTLES DRAWN AEROBIC AND ANAEROBIC Blood Culture adequate volume Performed at [facility] [address], [place], [place] [address]    Culture   Final    NO GROWTH 5 DAYS Performed at [facility], [place] [address] [place], [place] [address]    Report Status 01/07/2018 FINAL  Final  MRSA PCR Screening     Status: None   Collection Time: 01/02/18  4:25 PM  Result Value Ref Range Status   MRSA by PCR NEGATIVE NEGATIVE Final    Comment:        The GeneXpert MRSA Assay (FDA approved for NASAL specimens only), is one component of a comprehensive MRSA colonization surveillance program. It is not intended to diagnose MRSA infection nor to  guide or monitor treatment for MRSA infections. Performed at [facility], [place] [address], [place], [place] [address]     Alcide Evener, [place] [phone] pager  01/07/2018, 4:36 PM

## 2018-01-08 MED ORDER — MORPHINE SULFATE (CONCENTRATE) 10 MG/0.5ML PO SOLN
20.0000 mg | ORAL | Status: DC | PRN
  Administered 2018-01-08: 20 mg via ORAL
  Filled 2018-01-08: qty 1

## 2018-01-08 MED ORDER — PREDNISONE 20 MG PO TABS: 60.0000 mg | ORAL_TABLET | Freq: Every day | ORAL | 0 refills | Status: DC

## 2018-01-08 MED ORDER — FLUCONAZOLE 100 MG PO TABS: 100.0000 mg | ORAL_TABLET | Freq: Every day | ORAL | 0 refills | Status: DC

## 2018-01-08 MED ORDER — OXYCODONE HCL 5 MG PO TABS: 5.0000 mg | ORAL_TABLET | Freq: Four times a day (QID) | ORAL | 0 refills | Status: AC | PRN

## 2018-01-08 MED ORDER — HYDROCORTISONE 1 % EX CREA
TOPICAL_CREAM | Freq: Two times a day (BID) | CUTANEOUS | Status: DC
  Administered 2018-01-08 (×2): via TOPICAL
  Filled 2018-01-08: qty 28

## 2018-01-08 MED ORDER — PREDNISONE 20 MG PO TABS
60.0000 mg | ORAL_TABLET | Freq: Every day | ORAL | Status: DC
Start: 2018-01-09 — End: 2018-01-08

## 2018-01-08 MED ORDER — FLUCONAZOLE 100 MG PO TABS
100.0000 mg | ORAL_TABLET | Freq: Every day | ORAL | Status: DC
  Administered 2018-01-08: 100 mg via ORAL
  Filled 2018-01-08: qty 1

## 2018-01-08 NOTE — Discharge Summary (Addendum)
Physician Discharge Summary  Linda [name] MQK:[id number] DOB: [birth year] DOA: 01/06/2018  PCP: Tonette Bihari, MD  Admit date: 01/06/2018 Discharge date: 01/08/2018  Admitted From: home Disposition:  hom  Recommendations for Outpatient Follow-up:  1. She will follow-up with her rheumatologist on 10/16/2017 as she has already schedule the appointment.  Home Health:No Equipment/Devices:none  Discharge Condition:stable CODE STATUS:full Diet recommendation: Heart Healthy   Brief/Interim Summary: 22 y.o. female past medical history of undifferentiated lupus, hypertension recently discharged from the hospital on 01/03/2018 for presumed ACE inhibitor angioedema, sent home on steroids tapered came into the hospital complaining of tongue swelling and pain    Discharge Diagnoses:  Active Problems:   Primary hypertension   Leukocytosis   Tongue swelling Sepsis has been ruled out. A CT scan of the mouth was done with results as below. On admission she was started empirically on IV steroids or antibiotics were DC'd ENT and ID were consulted which agreed this was not an infection. She was continue narcotics for pain by the next day she was able to tolerate her diet. She was changed to oral prednisone which she will continue as an outpatient and follow-up with her rheumatologist over at [facility] on 01/13/2018. She was able to tolerate her diet and he will continue her Diflucan orally for 6 additional days. No changes were made to her medication for her high blood pressure  Discharge Instructions  Discharge Instructions    Consult to intensivist   Complete by:  As directed    Diet - low sodium heart healthy   Complete by:  As directed    Increase activity slowly   Complete by:  As directed      Allergies as of 01/08/2018      Reactions   Lisinopril Anaphylaxis   Angioedema   Vicodin [hydrocodone-acetaminophen] Itching, Rash      Medication List    STOP taking these  medications   diphenhydrAMINE 50 MG tablet Commonly known as:  BENADRYL     TAKE these medications   amLODipine 10 MG tablet Commonly known as:  NORVASC Take 1 tablet (10 mg total) by mouth daily.   carvedilol 10 MG 24 hr capsule Commonly known as:  COREG CR Take 1 capsule (10 mg total) by mouth daily.   chlorthalidone 50 MG tablet Commonly known as:  HYGROTON Take 1 tablet (50 mg total) by mouth daily.   fluconazole 100 MG tablet Commonly known as:  DIFLUCAN Take 1 tablet (100 mg total) by mouth daily.   oxyCODONE 5 MG immediate release tablet Commonly known as:  Oxy IR/ROXICODONE Take 1 tablet (5 mg total) by mouth every 6 (six) hours as needed for up to 5 days for breakthrough pain.   predniSONE 20 MG tablet Commonly known as:  DELTASONE Take 3 tablets (60 mg total) by mouth daily with breakfast. Start taking on:  01/09/2018 What changed:    medication strength  how much to take  how to take this  when to take this  additional instructions       Allergies  Allergen Reactions  . Lisinopril Anaphylaxis    Angioedema  . Vicodin [Hydrocodone-Acetaminophen] Itching and Rash    Consultations:  Infectious disease ENT.   Procedures/Studies: Ct Soft Tissue Neck W Contrast  Result Date: 01/06/2018 CLINICAL DATA:  Difficulty swallowing with tongue swelling. EXAM: CT NECK WITH CONTRAST TECHNIQUE: Multidetector CT imaging of the neck was performed using the standard protocol following the bolus administration of intravenous contrast. CONTRAST:  44mL ISOVUE-300 IOPAMIDOL (ISOVUE-300) INJECTION 61% COMPARISON:  None. FINDINGS: Pharynx and larynx: The tongue fills the oral cavity and is somewhat low-density. There is mild edematous appearance at the floor of mouth, best seen on coronal reformats both superficial and deep to the mylohyoid. This could reflect infection or angioedema. No laryngeal swelling or airway narrowing. Salivary glands: Negative. No asymmetric  enlargement or enhancement of the sublingual glands. Thyroid: Negative Lymph nodes: None enlarged or abnormal density. Vascular: Negative Limited intracranial: Negative Visualized orbits: Negative Mastoids and visualized paranasal sinuses: Clear Skeleton: No acute or aggressive finding. Upper chest: Negative Other: There is subcutaneous calcification along the bilateral cheek and left submandibular region. IMPRESSION: 1. Mild edema in the floor of mouth and submental region. This could reflect deep space infection; there is no visible underlying odontogenic or salivary source. No abscess. Patient also has history of angioedema. 2. Low-density appearance of the tongue, symmetry favoring fatty infiltration over edema. 3. Negative for laryngeal edema or airway stenosis. 4. Dermal based calcifications in the bilateral face which is nonspecific. Question if this is manifestation of patient's mixed connective tissue disease. Electronically Signed   By: Monte Fantasia M.D.   On: 01/06/2018 10:19     Subjective: Complaints feels great tolerating her diet.  Discharge Exam: Vitals:   01/08/18 0634 01/08/18 0842  BP: (!) 179/121 (!) 156/113  Pulse: 66   Resp: 20   Temp: 97.7 F (36.5 C)   SpO2: 99%    Vitals:   01/08/18 0046 01/08/18 0313 01/08/18 0634 01/08/18 0842  BP: (!) 169/118 (!) 175/121 (!) 179/121 (!) 156/113  Pulse: 68 66 66   Resp: 20 20 20    Temp: 98.1 F (36.7 C) 97.6 F (36.4 C) 97.7 F (36.5 C)   TempSrc: Oral Oral Oral   SpO2: 100% 100% 99%   Weight: 89.3 kg (196 lb 13.9 oz)     Height: 5\' 6"  (1.676 m)       General: Pt is alert, awake, not in acute distress Cardiovascular: RRR, S1/S2 +, no rubs, no gallops Respiratory: CTA bilaterally, no wheezing, no rhonchi Abdominal: Soft, NT, ND, bowel sounds + Extremities: no edema, no cyanosis    The results of significant diagnostics from this hospitalization (including imaging, microbiology, ancillary and laboratory) are listed  below for reference.     Microbiology: Recent Results (from the past 240 hour(s))  Blood culture (routine x 2)     Status: None   Collection Time: 01/02/18  6:05 AM  Result Value Ref Range Status   Specimen Description   Final    BLOOD RIGHT ARM Performed at [facility] [address], [place], [place] [address]    Special Requests   Final    BOTTLES DRAWN AEROBIC AND ANAEROBIC Blood Culture adequate volume Performed at [facility] [address], [place], [place] [address]    Culture   Final    NO GROWTH 5 DAYS Performed at [facility], [place] [address], [place], [place] [address]    Report Status 01/07/2018 FINAL  Final  Blood culture (routine x 2)     Status: None   Collection Time: 01/02/18  6:25 AM  Result Value Ref Range Status   Specimen Description   Final    BLOOD LEFT ARM Performed at [facility] [address], [place], [place] [address]    Special Requests   Final    BOTTLES DRAWN AEROBIC AND ANAEROBIC Blood Culture adequate  volume Performed at [facility], [place] [address] [place], [place] [address]    Culture   Final    NO GROWTH 5 DAYS Performed at [facility], [place] [address] [place], [place] [address]    Report Status 01/07/2018 FINAL  Final  MRSA PCR Screening     Status: None   Collection Time: 01/02/18  4:25 PM  Result Value Ref Range Status   MRSA by PCR NEGATIVE NEGATIVE Final    Comment:        The GeneXpert MRSA Assay (FDA approved for NASAL specimens only), is one component of a comprehensive MRSA colonization surveillance program. It is not intended to diagnose MRSA infection nor to guide or monitor treatment for MRSA infections. Performed at [facility], [place] [address], [place], [place] [address]      Labs: BNP (last 3 results) Recent Labs    11/29/17 1324  BNP 0,175.1*   Basic Metabolic Panel: Recent Labs  Lab  01/02/18 0605 01/03/18 0549 01/06/18 0758 01/07/18 0317  NA 132* 136 134* 132*  K 3.6 4.0 3.5 4.6  CL 96* 103 96* 95*  CO2 22 21* 24 23  GLUCOSE 92 124* 76 141*  BUN 16 18 24* 24*  CREATININE 1.52* 1.21* 1.58* 1.47*  CALCIUM 9.0 9.1 9.7 9.1   Liver Function Tests: Recent Labs  Lab 01/07/18 0317  AST 39  ALT 15  ALKPHOS 59  BILITOT 1.6*  PROT 7.6  ALBUMIN 3.3*   No results for input(s): LIPASE, AMYLASE in the last 168 hours. No results for input(s): AMMONIA in the last 168 hours. CBC: Recent Labs  Lab 01/02/18 0605 01/03/18 0549 01/06/18 0758 01/07/18 0317  WBC 15.4* 19.1* 20.0* 18.3*  NEUTROABS 8.4*  --  12.0  --   HGB 13.4 14.2 14.0 13.6  HCT 40.3 43.9 42.4 41.2  MCV 96.9 97.3 95.5 94.1  PLT 172 145* 139* 117*   Cardiac Enzymes: Recent Labs  Lab 01/02/18 1832 01/02/18 2349 01/03/18 0549  TROPONINI 0.05* 0.05* 0.05*   BNP: Invalid input(s): POCBNP CBG: No results for input(s): GLUCAP in the last 168 hours. D-Dimer No results for input(s): DDIMER in the last 72 hours. Hgb A1c No results for input(s): HGBA1C in the last 72 hours. Lipid Profile No results for input(s): CHOL, HDL, LDLCALC, TRIG, CHOLHDL, LDLDIRECT in the last 72 hours. Thyroid function studies Recent Labs    01/06/18 1649  TSH 0.477   Anemia work up No results for input(s): VITAMINB12, FOLATE, FERRITIN, TIBC, IRON, RETICCTPCT in the last 72 hours. Urinalysis    Component Value Date/Time   COLORURINE STRAW (A) 01/07/2018 0701   APPEARANCEUR HAZY (A) 01/07/2018 0701   LABSPEC 1.004 (L) 01/07/2018 0701   PHURINE 7.0 01/07/2018 0701   GLUCOSEU 50 (A) 01/07/2018 0701   HGBUR MODERATE (A) 01/07/2018 0701   BILIRUBINUR NEGATIVE 01/07/2018 0701   KETONESUR NEGATIVE 01/07/2018 0701   PROTEINUR NEGATIVE 01/07/2018 0701   UROBILINOGEN 1.0 03/30/2015 2328   NITRITE NEGATIVE 01/07/2018 0701   LEUKOCYTESUR LARGE (A) 01/07/2018 0701   Sepsis Labs Invalid input(s): PROCALCITONIN,  WBC,   LACTICIDVEN Microbiology Recent Results (from the past 240 hour(s))  Blood culture (routine x 2)     Status: None   Collection Time: 01/02/18  6:05 AM  Result Value Ref Range Status   Specimen Description   Final    BLOOD RIGHT ARM Performed at [facility], [place] [address] [place], [place] [address]  Special Requests   Final    BOTTLES DRAWN AEROBIC AND ANAEROBIC Blood Culture adequate volume Performed at [facility] [address] [place], [place] [address]    Culture   Final    NO GROWTH 5 DAYS Performed at [facility], [place] [address], [place], [place] [address]    Report Status 01/07/2018 FINAL  Final  Blood culture (routine x 2)     Status: None   Collection Time: 01/02/18  6:25 AM  Result Value Ref Range Status   Specimen Description   Final    BLOOD LEFT ARM Performed at [facility] [address], [place], [place] [address]    Special Requests   Final    BOTTLES DRAWN AEROBIC AND ANAEROBIC Blood Culture adequate volume Performed at [facility] [address] [place], [place] [address]    Culture   Final    NO GROWTH 5 DAYS Performed at [facility], [place] [address], [place], [place] [address]    Report Status 01/07/2018 FINAL  Final  MRSA PCR Screening     Status: None   Collection Time: 01/02/18  4:25 PM  Result Value Ref Range Status   MRSA by PCR NEGATIVE NEGATIVE Final    Comment:        The GeneXpert MRSA Assay (FDA approved for NASAL specimens only), is one component of a comprehensive MRSA colonization surveillance program. It is not intended to diagnose MRSA infection nor to guide or monitor treatment for MRSA infections. Performed at [facility], [place] [address] [place], [place] [address]      Time coordinating discharge: 35 minutes  SIGNED:   Charlynne Cousins, MD  [facility] 01/08/2018, 10:01 AM Pager   If  7PM-7AM, please contact night-coverage [email or web address] Password [email or web address]

## 2018-01-08 NOTE — Progress Notes (Signed)
Discharge teaching completed with teach back. Prescriptions given and pt. understands to follow up with primary MD. Prescriptions given for Prednisone, Diflucan, and Oxycodone. Medication handouts given and reviewed with pt. Pt. understands to take all prescribed home medications. Pt. Blood pressure remains elevated, MD aware. Pt. denies complaints of chest pain, dizziness, and no SOB noted.

## 2018-01-11 LAB — COMPLEMENT COMPONENT C1Q: C1q Complement Protein CC1Q: 14.8 mg/dL (ref 11.8–24.4)

## 2018-01-18 ENCOUNTER — Encounter (HOSPITAL_COMMUNITY): Payer: Self-pay | Admitting: Emergency Medicine

## 2018-01-18 ENCOUNTER — Emergency Department (HOSPITAL_COMMUNITY)
Admission: EM | Admit: 2018-01-18 | Discharge: 2018-01-18 | Disposition: A | Discharge status: Home / Self Care | Payer: Medicaid Other | Attending: Emergency Medicine | Admitting: Emergency Medicine

## 2018-01-18 DIAGNOSIS — Z79899 Other long term (current) drug therapy: Secondary | ICD-10-CM | POA: Diagnosis not present

## 2018-01-18 DIAGNOSIS — B370 Candidal stomatitis: Secondary | ICD-10-CM | POA: Diagnosis not present

## 2018-01-18 DIAGNOSIS — F17210 Nicotine dependence, cigarettes, uncomplicated: Secondary | ICD-10-CM | POA: Insufficient documentation

## 2018-01-18 DIAGNOSIS — I10 Essential (primary) hypertension: Secondary | ICD-10-CM | POA: Insufficient documentation

## 2018-01-18 DIAGNOSIS — K146 Glossodynia: Secondary | ICD-10-CM | POA: Diagnosis present

## 2018-01-18 MED ORDER — MAGIC MOUTHWASH W/LIDOCAINE: 5.0000 mL | Freq: Four times a day (QID) | ORAL | 0 refills | Status: AC

## 2018-01-18 MED ORDER — GI COCKTAIL ~~LOC~~
30.0000 mL | Freq: Once | ORAL | Status: AC
  Administered 2018-01-18: 30 mL via ORAL
  Filled 2018-01-18: qty 30
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
## 2018-01-18 NOTE — Discharge Instructions (Signed)
Swish 5 mL of the mouthwash in your mouth for 1 to 2 minutes and then spit it out.  You can do this 4 times daily for 2 weeks. Schedule appointment with your primary care provider to recheck your blood pressure, as it was high today, and also to follow-up if your symptoms persist.
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
## 2018-01-23 ENCOUNTER — Emergency Department (HOSPITAL_COMMUNITY)
Admission: EM | Admit: 2018-01-23 | Discharge: 2018-01-23 | Disposition: A | Discharge status: Home / Self Care | Payer: Medicaid Other | Source: Home / Self Care

## 2018-01-23 ENCOUNTER — Encounter (HOSPITAL_COMMUNITY): Payer: Self-pay | Admitting: Emergency Medicine

## 2018-01-23 DIAGNOSIS — Z5321 Procedure and treatment not carried out due to patient leaving prior to being seen by health care provider: Secondary | ICD-10-CM | POA: Insufficient documentation

## 2018-01-23 DIAGNOSIS — K149 Disease of tongue, unspecified: Secondary | ICD-10-CM

## 2018-01-23 NOTE — ED Notes (Signed)
Bed: WTR5 Expected date:  Expected time:  Means of arrival:  Comments: 
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
## 2018-01-24 ENCOUNTER — Other Ambulatory Visit: Payer: Self-pay

## 2018-01-24 ENCOUNTER — Inpatient Hospital Stay (HOSPITAL_COMMUNITY)
Admission: EM | Admit: 2018-01-24 | Discharge: 2018-01-28 | DRG: 546 | Disposition: A | Discharge status: Home / Self Care | Payer: Medicaid Other | Attending: Internal Medicine | Admitting: Internal Medicine

## 2018-01-24 ENCOUNTER — Emergency Department (HOSPITAL_COMMUNITY): Payer: Medicaid Other

## 2018-01-24 ENCOUNTER — Encounter (HOSPITAL_COMMUNITY): Payer: Self-pay

## 2018-01-24 DIAGNOSIS — Z91128 Patient's intentional underdosing of medication regimen for other reason: Secondary | ICD-10-CM

## 2018-01-24 DIAGNOSIS — Z885 Allergy status to narcotic agent status: Secondary | ICD-10-CM

## 2018-01-24 DIAGNOSIS — G8929 Other chronic pain: Secondary | ICD-10-CM | POA: Diagnosis present

## 2018-01-24 DIAGNOSIS — K140 Glossitis: Secondary | ICD-10-CM | POA: Diagnosis present

## 2018-01-24 DIAGNOSIS — Z8739 Personal history of other diseases of the musculoskeletal system and connective tissue: Secondary | ICD-10-CM | POA: Diagnosis not present

## 2018-01-24 DIAGNOSIS — M3219 Other organ or system involvement in systemic lupus erythematosus: Principal | ICD-10-CM | POA: Diagnosis present

## 2018-01-24 DIAGNOSIS — N179 Acute kidney failure, unspecified: Secondary | ICD-10-CM | POA: Diagnosis present

## 2018-01-24 DIAGNOSIS — N182 Chronic kidney disease, stage 2 (mild): Secondary | ICD-10-CM | POA: Diagnosis present

## 2018-01-24 DIAGNOSIS — Z765 Malingerer [conscious simulation]: Secondary | ICD-10-CM

## 2018-01-24 DIAGNOSIS — Y92009 Unspecified place in unspecified non-institutional (private) residence as the place of occurrence of the external cause: Secondary | ICD-10-CM | POA: Diagnosis not present

## 2018-01-24 DIAGNOSIS — K2980 Duodenitis without bleeding: Secondary | ICD-10-CM | POA: Diagnosis not present

## 2018-01-24 DIAGNOSIS — Z888 Allergy status to other drugs, medicaments and biological substances status: Secondary | ICD-10-CM | POA: Diagnosis not present

## 2018-01-24 DIAGNOSIS — N390 Urinary tract infection, site not specified: Secondary | ICD-10-CM | POA: Diagnosis present

## 2018-01-24 DIAGNOSIS — K148 Other diseases of tongue: Secondary | ICD-10-CM | POA: Diagnosis present

## 2018-01-24 DIAGNOSIS — F17290 Nicotine dependence, other tobacco product, uncomplicated: Secondary | ICD-10-CM | POA: Diagnosis present

## 2018-01-24 DIAGNOSIS — T380X6A Underdosing of glucocorticoids and synthetic analogues, initial encounter: Secondary | ICD-10-CM | POA: Diagnosis present

## 2018-01-24 DIAGNOSIS — I160 Hypertensive urgency: Secondary | ICD-10-CM | POA: Diagnosis present

## 2018-01-24 DIAGNOSIS — I129 Hypertensive chronic kidney disease with stage 1 through stage 4 chronic kidney disease, or unspecified chronic kidney disease: Secondary | ICD-10-CM | POA: Diagnosis present

## 2018-01-24 DIAGNOSIS — I161 Hypertensive emergency: Secondary | ICD-10-CM | POA: Diagnosis present

## 2018-01-24 DIAGNOSIS — Z841 Family history of disorders of kidney and ureter: Secondary | ICD-10-CM

## 2018-01-24 DIAGNOSIS — T380X5A Adverse effect of glucocorticoids and synthetic analogues, initial encounter: Secondary | ICD-10-CM | POA: Diagnosis present

## 2018-01-24 DIAGNOSIS — Z8249 Family history of ischemic heart disease and other diseases of the circulatory system: Secondary | ICD-10-CM

## 2018-01-24 DIAGNOSIS — F17210 Nicotine dependence, cigarettes, uncomplicated: Secondary | ICD-10-CM | POA: Diagnosis present

## 2018-01-24 DIAGNOSIS — D62 Acute posthemorrhagic anemia: Secondary | ICD-10-CM | POA: Diagnosis not present

## 2018-01-24 DIAGNOSIS — K529 Noninfective gastroenteritis and colitis, unspecified: Secondary | ICD-10-CM | POA: Diagnosis present

## 2018-01-24 DIAGNOSIS — Z832 Family history of diseases of the blood and blood-forming organs and certain disorders involving the immune mechanism: Secondary | ICD-10-CM

## 2018-01-24 DIAGNOSIS — B954 Other streptococcus as the cause of diseases classified elsewhere: Secondary | ICD-10-CM | POA: Diagnosis present

## 2018-01-24 DIAGNOSIS — M351 Other overlap syndromes: Secondary | ICD-10-CM | POA: Diagnosis present

## 2018-01-24 DIAGNOSIS — I776 Arteritis, unspecified: Secondary | ICD-10-CM | POA: Diagnosis present

## 2018-01-24 DIAGNOSIS — K1230 Oral mucositis (ulcerative), unspecified: Secondary | ICD-10-CM | POA: Diagnosis not present

## 2018-01-24 DIAGNOSIS — Z87892 Personal history of anaphylaxis: Secondary | ICD-10-CM | POA: Diagnosis not present

## 2018-01-24 DIAGNOSIS — R112 Nausea with vomiting, unspecified: Secondary | ICD-10-CM

## 2018-01-24 DIAGNOSIS — R319 Hematuria, unspecified: Secondary | ICD-10-CM

## 2018-01-24 DIAGNOSIS — D631 Anemia in chronic kidney disease: Secondary | ICD-10-CM | POA: Diagnosis present

## 2018-01-24 DIAGNOSIS — Z7952 Long term (current) use of systemic steroids: Secondary | ICD-10-CM

## 2018-01-24 LAB — BASIC METABOLIC PANEL
Anion gap: 16 — ABNORMAL HIGH (ref 5–15)
Anion gap: 13 (ref 5–15)
BUN: 20 mg/dL (ref 6–20)
BUN: 18 mg/dL (ref 6–20)
CO2: 18 mmol/L — ABNORMAL LOW (ref 22–32)
CO2: 24 mmol/L (ref 22–32)
Calcium: 7.9 mg/dL — ABNORMAL LOW (ref 8.9–10.3)
Calcium: 8.6 mg/dL — ABNORMAL LOW (ref 8.9–10.3)
Chloride: 99 mmol/L — ABNORMAL LOW (ref 101–111)
Chloride: 97 mmol/L — ABNORMAL LOW (ref 101–111)
Creatinine, Ser: 1.48 mg/dL — ABNORMAL HIGH (ref 0.44–1.00)
Creatinine, Ser: 1.45 mg/dL — ABNORMAL HIGH (ref 0.44–1.00)
GFR calc Af Amer: 57 mL/min — ABNORMAL LOW (ref >60)
GFR calc Af Amer: 59 mL/min — ABNORMAL LOW (ref >60)
GFR calc non Af Amer: 49 mL/min — ABNORMAL LOW (ref >60)
GFR calc non Af Amer: 51 mL/min — ABNORMAL LOW (ref >60)
Glucose, Bld: 73 mg/dL (ref 65–99)
Glucose, Bld: 89 mg/dL (ref 65–99)
Potassium: 3.8 mmol/L (ref 3.5–5.1)
Potassium: 2.8 mmol/L — ABNORMAL LOW (ref 3.5–5.1)
Sodium: 133 mmol/L — ABNORMAL LOW (ref 135–145)
Sodium: 134 mmol/L — ABNORMAL LOW (ref 135–145)

## 2018-01-24 LAB — URINALYSIS, ROUTINE W REFLEX MICROSCOPIC
Bilirubin Urine: NEGATIVE
Glucose, UA: NEGATIVE mg/dL
Ketones, ur: 5 mg/dL — AB
Nitrite: NEGATIVE
Protein, ur: 100 mg/dL — AB
RBC / HPF: >50 RBC/hpf — ABNORMAL HIGH (ref 0–5)
Specific Gravity, Urine: 1.021 (ref 1.005–1.030)
pH: 6 (ref 5.0–8.0)

## 2018-01-24 LAB — CBC WITH DIFFERENTIAL/PLATELET
Basophils Absolute: 0 10*3/uL (ref 0.0–0.1)
Basophils Relative: 0 %
Eosinophils Absolute: 0.1 10*3/uL (ref 0.0–0.7)
Eosinophils Relative: 1 %
HCT: 38.5 % (ref 36.0–46.0)
Hemoglobin: 12.9 g/dL (ref 12.0–15.0)
Lymphocytes Relative: 20 %
Lymphs Abs: 2.4 10*3/uL (ref 0.7–4.0)
MCH: 31.5 pg (ref 26.0–34.0)
MCHC: 33.5 g/dL (ref 30.0–36.0)
MCV: 94.1 fL (ref 78.0–100.0)
Monocytes Absolute: 1.2 10*3/uL — ABNORMAL HIGH (ref 0.1–1.0)
Monocytes Relative: 10 %
Neutro Abs: 8.1 10*3/uL — ABNORMAL HIGH (ref 1.7–7.7)
Neutrophils Relative %: 69 %
Platelets: 183 10*3/uL (ref 150–400)
RBC: 4.09 MIL/uL (ref 3.87–5.11)
RDW: 15.5 % (ref 11.5–15.5)
WBC: 11.8 10*3/uL — ABNORMAL HIGH (ref 4.0–10.5)

## 2018-01-24 LAB — CBC
HCT: 43.8 % (ref 36.0–46.0)
HCT: 39.3 % (ref 36.0–46.0)
Hemoglobin: 14.6 g/dL (ref 12.0–15.0)
Hemoglobin: 13.3 g/dL (ref 12.0–15.0)
MCH: 32 pg (ref 26.0–34.0)
MCH: 32.2 pg (ref 26.0–34.0)
MCHC: 33.3 g/dL (ref 30.0–36.0)
MCHC: 33.8 g/dL (ref 30.0–36.0)
MCV: 96.1 fL (ref 78.0–100.0)
MCV: 95.2 fL (ref 78.0–100.0)
Platelets: 159 10*3/uL (ref 150–400)
Platelets: 143 10*3/uL — ABNORMAL LOW (ref 150–400)
RBC: 4.56 MIL/uL (ref 3.87–5.11)
RBC: 4.13 MIL/uL (ref 3.87–5.11)
RDW: 15.7 % — ABNORMAL HIGH (ref 11.5–15.5)
RDW: 15.8 % — ABNORMAL HIGH (ref 11.5–15.5)
WBC: 11.9 10*3/uL — ABNORMAL HIGH (ref 4.0–10.5)
WBC: 9.3 10*3/uL (ref 4.0–10.5)

## 2018-01-24 LAB — I-STAT TROPONIN, ED
Troponin i, poc: 0.04 ng/mL (ref 0.00–0.08)
Troponin i, poc: 0.04 ng/mL (ref 0.00–0.08)

## 2018-01-24 LAB — LIPASE, BLOOD: Lipase: 28 U/L (ref 11–51)

## 2018-01-24 LAB — RAPID URINE DRUG SCREEN, HOSP PERFORMED
Amphetamines: POSITIVE — AB
Barbiturates: NOT DETECTED
Benzodiazepines: NOT DETECTED
Cocaine: NOT DETECTED
Opiates: POSITIVE — AB
Tetrahydrocannabinol: NOT DETECTED

## 2018-01-24 LAB — I-STAT CG4 LACTIC ACID, ED: Lactic Acid, Venous: 1.31 mmol/L (ref 0.5–1.9)

## 2018-01-24 LAB — COMPREHENSIVE METABOLIC PANEL
ALT: 15 U/L (ref 14–54)
AST: 18 U/L (ref 15–41)
Albumin: 3.9 g/dL (ref 3.5–5.0)
Alkaline Phosphatase: 60 U/L (ref 38–126)
Anion gap: 15 (ref 5–15)
BUN: 20 mg/dL (ref 6–20)
CO2: 26 mmol/L (ref 22–32)
Calcium: 9.2 mg/dL (ref 8.9–10.3)
Chloride: 88 mmol/L — ABNORMAL LOW (ref 101–111)
Creatinine, Ser: 1.57 mg/dL — ABNORMAL HIGH (ref 0.44–1.00)
GFR calc Af Amer: 53 mL/min — ABNORMAL LOW (ref >60)
GFR calc non Af Amer: 46 mL/min — ABNORMAL LOW (ref >60)
Glucose, Bld: 87 mg/dL (ref 65–99)
Potassium: 2.4 mmol/L — CL (ref 3.5–5.1)
Sodium: 129 mmol/L — ABNORMAL LOW (ref 135–145)
Total Bilirubin: 1.5 mg/dL — ABNORMAL HIGH (ref 0.3–1.2)
Total Protein: 7.7 g/dL (ref 6.5–8.1)

## 2018-01-24 LAB — MAGNESIUM: Magnesium: 1.5 mg/dL — ABNORMAL LOW (ref 1.7–2.4)

## 2018-01-24 LAB — I-STAT BETA HCG BLOOD, ED (MC, WL, AP ONLY): I-stat hCG, quantitative: <5 m[IU]/mL (ref <5)

## 2018-01-24 MED ORDER — ACETAMINOPHEN 325 MG PO TABS: 650.0000 mg | ORAL_TABLET | Freq: Four times a day (QID) | ORAL | Status: DC | PRN

## 2018-01-24 MED ORDER — MAGIC MOUTHWASH W/LIDOCAINE
5.0000 mL | Freq: Four times a day (QID) | ORAL | Status: DC
  Filled 2018-01-24 (×17): qty 5

## 2018-01-24 MED ORDER — CLONIDINE HCL 0.1 MG PO TABS
0.1000 mg | ORAL_TABLET | Freq: Three times a day (TID) | ORAL | Status: DC
  Administered 2018-01-24 – 2018-01-28 (×12): 0.1 mg via ORAL
  Filled 2018-01-24 (×12): qty 1

## 2018-01-24 MED ORDER — HYDROMORPHONE HCL 1 MG/ML IJ SOLN
1.0000 mg | Freq: Once | INTRAMUSCULAR | Status: AC
  Administered 2018-01-24: 1 mg via INTRAVENOUS
  Filled 2018-01-24: qty 1

## 2018-01-24 MED ORDER — MORPHINE SULFATE (PF) 4 MG/ML IV SOLN
4.0000 mg | Freq: Once | INTRAVENOUS | Status: AC
  Administered 2018-01-24: 4 mg via INTRAVENOUS
  Filled 2018-01-24: qty 1

## 2018-01-24 MED ORDER — POTASSIUM CHLORIDE IN NACL 40-0.9 MEQ/L-% IV SOLN
INTRAVENOUS | Status: DC
  Administered 2018-01-24 – 2018-01-25 (×2): 75 mL/h via INTRAVENOUS
  Filled 2018-01-24 (×3): qty 1000

## 2018-01-24 MED ORDER — FENTANYL CITRATE (PF) 100 MCG/2ML IJ SOLN: 25.0000 ug | Freq: Once | INTRAMUSCULAR | Status: DC

## 2018-01-24 MED ORDER — SODIUM CHLORIDE 0.9 % IV SOLN
1.0000 g | Freq: Once | INTRAVENOUS | Status: AC
  Administered 2018-01-24: 1 g via INTRAVENOUS
  Filled 2018-01-24: qty 10

## 2018-01-24 MED ORDER — ONDANSETRON HCL 4 MG/2ML IJ SOLN
4.0000 mg | Freq: Four times a day (QID) | INTRAMUSCULAR | Status: DC | PRN
  Administered 2018-01-25 – 2018-01-26 (×2): 4 mg via INTRAVENOUS
  Filled 2018-01-24 (×2): qty 2

## 2018-01-24 MED ORDER — SODIUM CHLORIDE 0.9 % IV BOLUS
1000.0000 mL | Freq: Once | INTRAVENOUS | Status: AC
  Administered 2018-01-24: 1000 mL via INTRAVENOUS

## 2018-01-24 MED ORDER — METOCLOPRAMIDE HCL 5 MG/ML IJ SOLN
10.0000 mg | Freq: Once | INTRAMUSCULAR | Status: AC
  Administered 2018-01-24: 10 mg via INTRAVENOUS
  Filled 2018-01-24: qty 2

## 2018-01-24 MED ORDER — SODIUM CHLORIDE 0.9 % IV SOLN
1.0000 g | INTRAVENOUS | Status: DC
  Administered 2018-01-25: 1 g via INTRAVENOUS
  Filled 2018-01-24: qty 1

## 2018-01-24 MED ORDER — HYDRALAZINE HCL 20 MG/ML IJ SOLN
10.0000 mg | Freq: Four times a day (QID) | INTRAMUSCULAR | Status: DC | PRN
  Administered 2018-01-24: 10 mg via INTRAVENOUS
  Filled 2018-01-24: qty 1

## 2018-01-24 MED ORDER — ONDANSETRON HCL 4 MG PO TABS
4.0000 mg | ORAL_TABLET | Freq: Four times a day (QID) | ORAL | Status: DC | PRN
Start: 2018-01-24 — End: 2018-01-28

## 2018-01-24 MED ORDER — SODIUM CHLORIDE 0.9 % IV SOLN
INTRAVENOUS | Status: DC
  Administered 2018-01-25: via INTRAVENOUS

## 2018-01-24 MED ORDER — IOPAMIDOL (ISOVUE-300) INJECTION 61%
INTRAVENOUS | Status: AC
  Administered 2018-01-24: 10:00:00
  Filled 2018-01-24: qty 100

## 2018-01-24 MED ORDER — POTASSIUM CHLORIDE CRYS ER 20 MEQ PO TBCR
40.0000 meq | EXTENDED_RELEASE_TABLET | Freq: Once | ORAL | Status: AC
  Administered 2018-01-24: 40 meq via ORAL
  Filled 2018-01-24: qty 2

## 2018-01-24 MED ORDER — FENTANYL CITRATE (PF) 100 MCG/2ML IJ SOLN
25.0000 ug | INTRAMUSCULAR | Status: DC | PRN
  Administered 2018-01-24 – 2018-01-25 (×4): 25 ug via INTRAVENOUS
  Filled 2018-01-24 (×5): qty 2

## 2018-01-24 MED ORDER — NICARDIPINE HCL IN NACL 20-0.86 MG/200ML-% IV SOLN
3.0000 mg/h | Freq: Once | INTRAVENOUS | Status: AC
  Administered 2018-01-24: 5 mg/h via INTRAVENOUS
  Filled 2018-01-24: qty 200

## 2018-01-24 MED ORDER — POTASSIUM CHLORIDE 10 MEQ/100ML IV SOLN
10.0000 meq | INTRAVENOUS | Status: AC
  Administered 2018-01-24 (×3): 10 meq via INTRAVENOUS
  Filled 2018-01-24 (×3): qty 100

## 2018-01-24 MED ORDER — MAGNESIUM SULFATE IN D5W 1-5 GM/100ML-% IV SOLN
1.0000 g | Freq: Once | INTRAVENOUS | Status: AC
  Administered 2018-01-24: 1 g via INTRAVENOUS
  Filled 2018-01-24: qty 100

## 2018-01-24 MED ORDER — "TRANEXAMIC ACID 5% ORAL SOLUTION "
10.0000 mL | Freq: Once | ORAL | Status: AC
  Administered 2018-01-24: 10 mL via OROMUCOSAL
  Filled 2018-01-24: qty 10

## 2018-01-24 MED ORDER — PANTOPRAZOLE SODIUM 40 MG IV SOLR
40.0000 mg | Freq: Once | INTRAVENOUS | Status: AC
  Administered 2018-01-24: 40 mg via INTRAVENOUS
  Filled 2018-01-24: qty 40

## 2018-01-24 MED ORDER — ACETAMINOPHEN 650 MG RE SUPP: 650.0000 mg | Freq: Four times a day (QID) | RECTAL | Status: DC | PRN

## 2018-01-24 MED ORDER — AMLODIPINE 1 MG/ML ORAL SUSPENSION: 10.0000 mg | Freq: Every day | ORAL | Status: DC

## 2018-01-24 MED ORDER — CHLORTHALIDONE 50 MG PO TABS
50.0000 mg | ORAL_TABLET | Freq: Every day | ORAL | Status: DC
  Administered 2018-01-24 – 2018-01-28 (×5): 50 mg via ORAL
  Filled 2018-01-24 (×5): qty 1

## 2018-01-24 MED ORDER — PANTOPRAZOLE SODIUM 40 MG IV SOLR
40.0000 mg | Freq: Two times a day (BID) | INTRAVENOUS | Status: DC
  Administered 2018-01-24 – 2018-01-25 (×2): 40 mg via INTRAVENOUS
  Filled 2018-01-24 (×2): qty 40

## 2018-01-24 MED ORDER — SODIUM CHLORIDE 0.9 % IV SOLN
INTRAVENOUS | Status: DC
Start: 2018-01-24 — End: 2018-01-24
  Filled 2018-01-24: qty 1000

## 2018-01-24 MED ORDER — MAGNESIUM SULFATE 50 % IJ SOLN: 1.0000 g | Freq: Once | INTRAMUSCULAR | Status: DC

## 2018-01-24 MED ORDER — MORPHINE SULFATE (PF) 2 MG/ML IV SOLN
2.0000 mg | Freq: Once | INTRAVENOUS | Status: AC
  Administered 2018-01-24: 2 mg via INTRAVENOUS
  Filled 2018-01-24: qty 1

## 2018-01-24 MED ORDER — AMLODIPINE BESYLATE 10 MG PO TABS
10.0000 mg | ORAL_TABLET | Freq: Every day | ORAL | Status: DC
  Administered 2018-01-24 – 2018-01-28 (×5): 10 mg via ORAL
  Filled 2018-01-24 (×2): qty 1
  Filled 2018-01-24: qty 2
  Filled 2018-01-24 (×2): qty 1

## 2018-01-24 MED ORDER — METHYLPREDNISOLONE SODIUM SUCC 125 MG IJ SOLR
60.0000 mg | Freq: Once | INTRAMUSCULAR | Status: AC
  Administered 2018-01-24: 60 mg via INTRAVENOUS
  Filled 2018-01-24: qty 2

## 2018-01-24 MED ORDER — DIPHENHYDRAMINE HCL 50 MG/ML IJ SOLN
25.0000 mg | Freq: Once | INTRAMUSCULAR | Status: AC
  Administered 2018-01-24: 25 mg via INTRAVENOUS
  Filled 2018-01-24: qty 1

## 2018-01-24 MED ORDER — CARVEDILOL PHOSPHATE ER 10 MG PO CP24
10.0000 mg | ORAL_CAPSULE | Freq: Every day | ORAL | Status: DC
  Administered 2018-01-24 – 2018-01-28 (×5): 10 mg via ORAL
  Filled 2018-01-24 (×5): qty 1

## 2018-01-24 MED ORDER — IOPAMIDOL (ISOVUE-300) INJECTION 61%
100.0000 mL | Freq: Once | INTRAVENOUS | Status: AC | PRN
  Administered 2018-01-24: 100 mL via INTRAVENOUS

## 2018-01-24 NOTE — ED Notes (Signed)
Patient is aware we need urine. Asked patient if she could urinate before RN hooked her to fluids and medication. Patient states " I can't pee right now"

## 2018-01-24 NOTE — ED Notes (Signed)
Based on recent BP. This RN is keeping titration steady at 3mg /hr

## 2018-01-24 NOTE — ED Notes (Signed)
BP has decreased within parameters stated. This RN has started to titrate Cardene down and will notify the PA.

## 2018-01-24 NOTE — ED Notes (Signed)
I attempted to stick patient x1, unsuccessful, RN notified and main lab called for assistance.

## 2018-01-24 NOTE — H&P (Addendum)
[facility] History and Physical  Linda [name] YJE:[id number] DOB: 11/21/1995 DOA: 01/24/2018  Referring physician:   PCP: Tonette Bihari, MD   Chief Complaint:    HPI:    22 year old female with a history of mixed connective tissue disease followed at [facility] [place] presented to the ED today because of profuse bleeding from her tongue  for the last 2 days . She has pictures of blood pouring out of her mouth on her cell phone. She was evaluated by her rheumatologist on 5/14 and 5/23 and it was felt that the patient's tongue ulcer was secondary to lupus. Patient was asked to take 60 mg daily of prednisone for 1 week followed by a taper. She was scheduled to be started on Benlysta 200 mg SQ weekly..    today she   describes multiple clots and bleeding over the last 2 days. She has been taking prednisone 60 mg a day. She also reported some black tarry stools 5 days, no melena or hematochezia since .  Initial hemoglobin is 12.9.  Patient found to be hypertensive, denied any chest pain shortness of breath. She has not taken any of her usual blood pressure pills this morning ED course BP (!) 190/159 (BP Location: Left Arm)   Pulse 95   Temp 98.2 F (36.8 C) (Oral)   Resp 16   LMP 01/19/2018 (Approximate) Comment: negative HCG blood test 01-24-2018  SpO2 100%  found to be in hypertensive urgency and started on a Cardene drip Creatinine around 1.57, baseline UA positive for hematuria, and UTI CT abdomen pelvis showed nonspecific inflammation of the duodenum, possible gastritis, bladder wall thickening Discussed with the EDP , Dr. Addison Lank, we don't have rheumatology support and we strongly recommended transfer to [facility] They have her on a waiting list for a regular bed Discussed with rheumatologist on call at [facility]. She declined to make treatment recommendations on the phone without seeing the patient. Requested EDP to  consult ENT for further recommendations regarding patient's bleeding ulcer on the tongue. EDP also applied  tranexamic acid coated  Pressure gauze on the tongue to control the bleeding. She will be admitted to our telemetry floor pending transport to [place]: negative for the following  Constitutional: Denies fever, chills, diaphoresis, appetite change and fatigue.  HEENT: Denies photophobia, eye pain, bleeding tongue ulcer, congestion, sore throat, rhinorrhea, sneezing, mouth sores, trouble swallowing, neck pain, neck stiffness and tinnitus.  Respiratory: Denies SOB, DOE, cough, chest tightness, and wheezing.  Cardiovascular: Denies chest pain, palpitations and leg swelling.  Gastrointestinal: Positive for abdominal pain, constipation, nausea and vomiting Genitourinary: Denies dysuria, urgency, frequency, hematuria, flank pain and difficulty urinating.  Musculoskeletal: Denies myalgias, back pain, joint swelling, arthralgias and gait problem.  Skin: Denies pallor, rash and wound.  Neurological: Denies dizziness, seizures, syncope, weakness, light-headedness, numbness and headaches.  Hematological: Denies adenopathy. Easy bruising, personal or family bleeding history  Psychiatric/Behavioral: Denies suicidal ideation, mood changes, confusion, nervousness, sleep disturbance and agitation       Past Medical History:  Diagnosis Date  . Acute kidney injury ([place])   . Angioedema   . Hypertension   . Lupus ([place])   . Migraines   . Mixed connective tissue disease [facility])      Past Surgical History:  Procedure Laterality Date  . I&D EXTREMITY Right 08/06/2017   Procedure: IRRIGATION AND DEBRIDEMENT KNEE;  Surgeon: Shona Needles,  MD;  Location: [place];  Service: Orthopedics;  Laterality: Right;  . MULTIPLE TOOTH EXTRACTIONS        Social History:  reports that she has been smoking cigarettes and cigars.  She started smoking about 2 years ago. She has been  smoking about 0.00 packs per day. She has never used smokeless tobacco. She reports that she does not drink alcohol or use drugs.    Allergies  Allergen Reactions  . Lisinopril Anaphylaxis    Angioedema  . Vicodin [Hydrocodone-Acetaminophen] Itching and Rash    Family History  Problem Relation Age of Onset  . Lupus Mother   . Hypertension Mother   . Kidney disease Mother   . Diabetes Maternal Grandmother          Prior to Admission medications   Medication Sig Start Date End Date Taking? Authorizing Provider  amLODipine (NORVASC) 10 MG tablet Take 1 tablet (10 mg total) by mouth daily. 01/03/18  Yes Patrecia Pour, Christean Grief, MD  chlorthalidone (HYGROTON) 50 MG tablet Take 1 tablet (50 mg total) by mouth daily. 01/04/18  Yes Doreatha Lew, MD  magic mouthwash w/lidocaine SOLN Take 5 mLs by mouth 4 (four) times daily for 14 days. Swish solution in your mouth for 1-2 minutes and then spit out. 01/18/18 02/01/18 Yes Quentin Cornwall, [place] N, PA-C  oxyCODONE-acetaminophen (PERCOCET/ROXICET) 5-325 MG tablet Take 1 tablet by mouth 4 (four) times daily as needed for pain. 01/14/18  Yes [provider]  predniSONE (DELTASONE) 20 MG tablet Take 3 tablets (60 mg total) by mouth daily with breakfast. 01/09/18  Yes Charlynne Cousins, MD  carvedilol (COREG CR) 10 MG 24 hr capsule Take 1 capsule (10 mg total) by mouth daily. Patient not taking: Reported on 01/24/2018 01/04/18   Patrecia Pour, Christean Grief, MD  fluconazole (DIFLUCAN) 100 MG tablet Take 1 tablet (100 mg total) by mouth daily. Patient not taking: Reported on 01/24/2018 01/08/18   Charlynne Cousins, MD     Physical Exam: Vitals:   01/24/18 1215 01/24/18 1245 01/24/18 1316 01/24/18 1330  BP: (!) 152/112 (!) 155/114 (!) 183/118 (!) 153/99  Pulse:  82  (!) 103  Resp: 17 (!) 21 10 13   Temp:      TempSrc:      SpO2:  97%  97%        Vitals:   01/24/18 1215 01/24/18 1245 01/24/18 1316 01/24/18 1330  BP: (!) 152/112 (!) 155/114 (!)  183/118 (!) 153/99  Pulse:  82  (!) 103  Resp: 17 (!) 21 10 13   Temp:      TempSrc:      SpO2:  97%  97%   Constitutional: NAD, calm, comfortable Eyes: PERRL, lids and conjunctivae normal ENMT:large ulcer on anterior aspect of the tongue which is bleeding with an adherent clot   Posterior pharynx clear of any exudate or lesions.Normal dentition.  Neck: normal, supple, no masses, no thyromegaly Respiratory: clear to auscultation bilaterally, no wheezing, no crackles. Normal respiratory effort. No accessory muscle use.  Cardiovascular: Regular rate and rhythm, no murmurs / rubs / gallops. No extremity edema. 2+ pedal pulses. No carotid bruits.  Abdomen: no tenderness, no masses palpated. No hepatosplenomegaly. Bowel sounds positive.  Musculoskeletal: no clubbing / cyanosis. No joint deformity upper and lower extremities. Good ROM, no contractures. Normal muscle tone.  Skin: no rashes, lesions, ulcers. No induration Neurologic: CN 2-12 grossly intact. Sensation intact, DTR normal. Strength 5/5 in all 4.  Psychiatric: Normal judgment and insight. Alert  and oriented x 3. Normal mood.     Labs on Admission: I have personally reviewed following labs and imaging studies  CBC: Recent Labs  Lab 01/24/18 0630  WBC 11.8*  NEUTROABS 8.1*  HGB 12.9  HCT 38.5  MCV 94.1  PLT 088    Basic Metabolic Panel: Recent Labs  Lab 01/24/18 0623 01/24/18 0903 01/24/18 1327  NA 129* 133* 134*  K 2.4* 3.8 2.8*  CL 88* 99* 97*  CO2 26 18* 24  GLUCOSE 87 73 89  BUN 20 20 18   CREATININE 1.57* 1.48* 1.45*  CALCIUM 9.2 7.9* 8.6*  MG  --  1.5*  --     GFR: CrCl cannot be calculated (Unknown ideal weight.).  Liver Function Tests: Recent Labs  Lab 01/24/18 0623  AST 18  ALT 15  ALKPHOS 60  BILITOT 1.5*  PROT 7.7  ALBUMIN 3.9   Recent Labs  Lab 01/24/18 0623  LIPASE 28   No results for input(s): AMMONIA in the last 168 hours.  Coagulation Profile: No results for input(s): INR,  PROTIME in the last 168 hours. No results for input(s): DDIMER in the last 72 hours.  Cardiac Enzymes: No results for input(s): CKTOTAL, CKMB, CKMBINDEX, TROPONINI in the last 168 hours.  BNP (last 3 results) No results for input(s): PROBNP in the last 8760 hours.  HbA1C: No results for input(s): HGBA1C in the last 72 hours. Lab Results  Component Value Date   HGBA1C 4.6 (L) 04/09/2017     CBG: No results for input(s): GLUCAP in the last 168 hours.  Lipid Profile: No results for input(s): CHOL, HDL, LDLCALC, TRIG, CHOLHDL, LDLDIRECT in the last 72 hours.  Thyroid Function Tests: No results for input(s): TSH, T4TOTAL, FREET4, T3FREE, THYROIDAB in the last 72 hours.  Anemia Panel: No results for input(s): VITAMINB12, FOLATE, FERRITIN, TIBC, IRON, RETICCTPCT in the last 72 hours.  Urine analysis:    Component Value Date/Time   COLORURINE YELLOW 01/24/2018 [place] 01/24/2018 1027   LABSPEC 1.021 01/24/2018 1027   PHURINE 6.0 01/24/2018 1027   GLUCOSEU NEGATIVE 01/24/2018 1027   HGBUR LARGE (A) 01/24/2018 1027   BILIRUBINUR NEGATIVE 01/24/2018 1027   KETONESUR 5 (A) 01/24/2018 1027   PROTEINUR 100 (A) 01/24/2018 1027   UROBILINOGEN 1.0 03/30/2015 2328   NITRITE NEGATIVE 01/24/2018 1027   LEUKOCYTESUR MODERATE (A) 01/24/2018 1027    Sepsis Labs: @LABRCNTIP (procalcitonin:4,lacticidven:4) )No results found for this or any previous visit (from the past 240 hour(s)).       Radiological Exams on Admission: Dg Chest 1 View  Result Date: 01/24/2018 CLINICAL DATA:  Hematemesis.  Epigastric pain. EXAM: CHEST  1 VIEW COMPARISON:  Two-view chest x-ray 11/29/2017 FINDINGS: Heart remains mildly enlarged. There is no edema or effusion. No focal airspace disease is present. The visualized soft tissues and bony thorax are unremarkable. IMPRESSION: 1. Mild cardiomegaly without failure. 2. No acute cardiopulmonary disease. Electronically Signed   By: San Morelle M.D.   On: 01/24/2018 07:34   Ct Abdomen Pelvis W Contrast  Result Date: 01/24/2018 CLINICAL DATA:  Pt reports hematemesis x1 day accompanied by generalized abdominal pain, Hx lupus and HTN EXAM: CT ABDOMEN AND PELVIS WITH CONTRAST TECHNIQUE: Multidetector CT imaging of the abdomen and pelvis was performed using the standard protocol following bolus administration of intravenous contrast. CONTRAST:  171mL ISOVUE-300 IOPAMIDOL (ISOVUE-300) INJECTION 61% COMPARISON:  11/29/2017 FINDINGS: Lower chest: Previous pericardial effusion has resolved. Mild cardiomegaly. Interventricular septal thickness 2.0 cm, query  left ventricular hypertrophy. Hepatobiliary: Stable 0.7 by 0.5 cm hypodense lesion in segment IV a of the liver on image 13/2, statistically likely to be a cyst or similar small benign lesion but technically nonspecific. The liver and gallbladder appear otherwise unremarkable. Pancreas: Unremarkable Spleen: Unremarkable Adrenals/Urinary Tract: Cortical thinning in both kidneys. Wall thickening in the urinary bladder especially along the right upper urinary bladder as shown on image 58/5. Stomach/Bowel: The appendix sits down in the pelvis just above the urinary bladder. The appendix is seen above the cecum on image 52/4 and appears normal. I do not see an inflammatory process involving the cecum, terminal ileum, or appendix to suggest that the right bladder roof wall thickening is due to secondary inflammation. There is wall thickening in the distal third portion of the duodenum on images 27 through 36 of series 2. Vascular/Lymphatic: Unremarkable Reproductive: Unremarkable Other: Trace free pelvic fluid, likely physiologic. Musculoskeletal: No CT findings of avascular necrosis of the hips. 4 mm anterolisthesis associated with bilateral chronic pars defects at L5. Small central disc protrusion at L4-5. IMPRESSION: 1. Abnormal wall thickening along the right anterior roof of the urinary bladder with  very faint stranding in the adjacent adipose tissues. No adjacent bowel abnormality is identified in the appendix appears normal. This could be an unusual manifestation of lupus cystitis, tuberculous cystitis, eosinophilic cystitis, hemorrhage into the bladder wall, or malakoplakia. 2. There is mild wall thickening in the distal transverse duodenum extending up towards the force portion of the duodenum. Appearance favors nonspecific inflammation. 3. Mild cardiomegaly. Previous pericardial effusion has resolved. Thickened interventricular septum, query left ventricular hypertrophy. 6 mm hypodense lesion in segment IV a of the liver, probably a small cyst but technically nonspecific due to small size. 4. Cortical thinning in both kidneys. 5. Bilateral chronic pars defects at L5 with 4 mm anterolisthesis of L5 on S1. Small disc protrusion at L4-5. Electronically Signed   By: Van Clines M.D.   On: 01/24/2018 09:13   Dg Chest 1 View  Result Date: 01/24/2018 CLINICAL DATA:  Hematemesis.  Epigastric pain. EXAM: CHEST  1 VIEW COMPARISON:  Two-view chest x-ray 11/29/2017 FINDINGS: Heart remains mildly enlarged. There is no edema or effusion. No focal airspace disease is present. The visualized soft tissues and bony thorax are unremarkable. IMPRESSION: 1. Mild cardiomegaly without failure. 2. No acute cardiopulmonary disease. Electronically Signed   By: San Morelle M.D.   On: 01/24/2018 07:34   Ct Soft Tissue Neck W Contrast  Result Date: 01/06/2018 CLINICAL DATA:  Difficulty swallowing with tongue swelling. EXAM: CT NECK WITH CONTRAST TECHNIQUE: Multidetector CT imaging of the neck was performed using the standard protocol following the bolus administration of intravenous contrast. CONTRAST:  67mL ISOVUE-300 IOPAMIDOL (ISOVUE-300) INJECTION 61% COMPARISON:  None. FINDINGS: Pharynx and larynx: The tongue fills the oral cavity and is somewhat low-density. There is mild edematous appearance at the floor  of mouth, best seen on coronal reformats both superficial and deep to the mylohyoid. This could reflect infection or angioedema. No laryngeal swelling or airway narrowing. Salivary glands: Negative. No asymmetric enlargement or enhancement of the sublingual glands. Thyroid: Negative Lymph nodes: None enlarged or abnormal density. Vascular: Negative Limited intracranial: Negative Visualized orbits: Negative Mastoids and visualized paranasal sinuses: Clear Skeleton: No acute or aggressive finding. Upper chest: Negative Other: There is subcutaneous calcification along the bilateral cheek and left submandibular region. IMPRESSION: 1. Mild edema in the floor of mouth and submental region. This could reflect deep space infection; there is  no visible underlying odontogenic or salivary source. No abscess. Patient also has history of angioedema. 2. Low-density appearance of the tongue, symmetry favoring fatty infiltration over edema. 3. Negative for laryngeal edema or airway stenosis. 4. Dermal based calcifications in the bilateral face which is nonspecific. Question if this is manifestation of patient's mixed connective tissue disease. Electronically Signed   By: Monte Fantasia M.D.   On: 01/06/2018 10:19   Ct Abdomen Pelvis W Contrast  Result Date: 01/24/2018 CLINICAL DATA:  Pt reports hematemesis x1 day accompanied by generalized abdominal pain, Hx lupus and HTN EXAM: CT ABDOMEN AND PELVIS WITH CONTRAST TECHNIQUE: Multidetector CT imaging of the abdomen and pelvis was performed using the standard protocol following bolus administration of intravenous contrast. CONTRAST:  159mL ISOVUE-300 IOPAMIDOL (ISOVUE-300) INJECTION 61% COMPARISON:  11/29/2017 FINDINGS: Lower chest: Previous pericardial effusion has resolved. Mild cardiomegaly. Interventricular septal thickness 2.0 cm, query left ventricular hypertrophy. Hepatobiliary: Stable 0.7 by 0.5 cm hypodense lesion in segment IV a of the liver on image 13/2, statistically  likely to be a cyst or similar small benign lesion but technically nonspecific. The liver and gallbladder appear otherwise unremarkable. Pancreas: Unremarkable Spleen: Unremarkable Adrenals/Urinary Tract: Cortical thinning in both kidneys. Wall thickening in the urinary bladder especially along the right upper urinary bladder as shown on image 58/5. Stomach/Bowel: The appendix sits down in the pelvis just above the urinary bladder. The appendix is seen above the cecum on image 52/4 and appears normal. I do not see an inflammatory process involving the cecum, terminal ileum, or appendix to suggest that the right bladder roof wall thickening is due to secondary inflammation. There is wall thickening in the distal third portion of the duodenum on images 27 through 36 of series 2. Vascular/Lymphatic: Unremarkable Reproductive: Unremarkable Other: Trace free pelvic fluid, likely physiologic. Musculoskeletal: No CT findings of avascular necrosis of the hips. 4 mm anterolisthesis associated with bilateral chronic pars defects at L5. Small central disc protrusion at L4-5. IMPRESSION: 1. Abnormal wall thickening along the right anterior roof of the urinary bladder with very faint stranding in the adjacent adipose tissues. No adjacent bowel abnormality is identified in the appendix appears normal. This could be an unusual manifestation of lupus cystitis, tuberculous cystitis, eosinophilic cystitis, hemorrhage into the bladder wall, or malakoplakia. 2. There is mild wall thickening in the distal transverse duodenum extending up towards the force portion of the duodenum. Appearance favors nonspecific inflammation. 3. Mild cardiomegaly. Previous pericardial effusion has resolved. Thickened interventricular septum, query left ventricular hypertrophy. 6 mm hypodense lesion in segment IV a of the liver, probably a small cyst but technically nonspecific due to small size. 4. Cortical thinning in both kidneys. 5. Bilateral chronic  pars defects at L5 with 4 mm anterolisthesis of L5 on S1. Small disc protrusion at L4-5. Electronically Signed   By: Van Clines M.D.   On: 01/24/2018 09:13      EKG: Independently reviewed   Assessment/Plan Principal Problem: Large tongue ulcer secondary to SLE Patient being admitted to [facility] as no bed available at [facility] currently, placed on waiting list Discuss with rheumatology at [facility], no recommendations given by them on the phone Since patient was on prednisone at home, will treat her with moderate dose Solu-Medrol Requested EDP to consult ENT for further recommendations regarding surgical control of bleeding tongue ulcer   Hypertensive urgency Resumed home medications, Coreg, chlorthalidone, amlodipine Wean off Cardene drip Admit patient to telemetry  Melena/hematochezia, now resolved CT shows wall thickening  in the distal transverse duodenum, suggesting nonspecific inflammation Start patient on Protonix twice a day Serial CBC, nothing by mouth, observe  UTI continue Rocephin  History of connective tissue disease-followed at [facility]     DVT prophylaxis: SCDs  Code Status History      consults called:  Family Communication: Admission, patients condition and plan of care including tests being ordered have been discussed with the patient  who indicates understanding and agree with the plan and Code Status   Admission status:  The appropriate patient status for this patient is INPATIENT. Inpatient status is judged to be reasonable and necessary in order to provide the required intensity of service to ensure the patient's safety. The patient's presenting symptoms, physical exam findings, and initial radiographic and laboratory data in the context of their chronic comorbidities is felt to place them at high risk for further clinical deterioration. Furthermore, it is not anticipated that the patient will be medically stable for discharge  from the hospital within 2 midnights of admission. The following factors support the patient status of inpatient.    "           The patient's presenting symptoms include low back pain. "           The worrisome physical exam findings include and inability to walk. "           The initial radiographic and laboratory data are worrisome because of sacral fracture on CT. "           The chronic co-morbidities include history of hypertension.     * I certify that at the point of admission it is my clinical judgment that the patient will require inpatient hospital care spanning beyond 2 midnights from the point of admission due to high intensity of service, high risk for further deterioration and high frequency of surveillance required.*    Disposition plan: Further plan will depend as patient's clinical course evolves and further radiologic and laboratory data become available. Likely home when stable       Reyne Dumas MD [facility] Pager [phone]  If 7PM-7AM, please contact night-coverage [email or web address] Password [email or web address]  01/24/2018, 2:09 PM

## 2018-01-24 NOTE — Consult Note (Deleted)
  22 year old female with a history of mixed connective tissue disease followed at [facility], hypertension,who presented to the ED today because of profuse bleeding from her tongue also, off work for which she was evaluated by her rheumatologist on 5/17 and 5/23. It was thought that the patient's tongue also due to severe uncontrolled lupus. She was started on prednisone 60 mg for 10 days. She was also scheduled to be started on Benlysta, pending insurance approval  She presents today with severe bleeding from tongue also of which she has pictures on her cell phone. She describes multiple clots and bleeding over the last 2 days. She has been taking prednisone 60 mg a day. She also reported some black tarry stools 5 days ago.her hemoglobin is 12.9.    Patient was seen and examined She has a large nonbleeding ulcer with adherent clot and surrounding ecchymoses  of the anterior aspect of the tongue Labs reviewed, potassium 2.4, magnesium 1.5, sodium 129  At this point I would recommend transfer to inpatient service/tertiary care center where rheumatology services are available. No rheumatology services available at [facility]. She will probably need IV steroids and immunotherapy to control her bleeding.

## 2018-01-24 NOTE — Progress Notes (Signed)
Subjective: New anterior tongue lesion with acute bleeding over the last 2 days. (Pt well known to me from previous admissions)  Objective: Vital signs in last 24 hours: Temp:  [98.2 F (36.8 C)] 98.2 F (36.8 C) (05/27 0624) Pulse Rate:  [82-103] 103 (05/27 1330) Resp:  [10-26] 15 (05/27 1445) BP: (131-190)/(80-159) 151/101 (05/27 1445) SpO2:  [97 %-100 %] 97 % (05/27 1330)  Physical Exam Constitutional: She isoriented to person, place, and time. She appearswell-developedand well-nourished. Head:Normocephalic. Ears: Normal auricles and external auditory canals. Nose: Congested mucosa over the nasal septum and turbinates.  Mouth: Mild tongue edema, previous necrotic areas have healed. Now with a new hypervascular lesion at the anterior tongue. Eyes:Conjunctivaeare normal.Pupils are equal round and reactive to light. Neck:Neck supple.No significant edema. No lymphadenopathy or mass.  Cardiovascular:Normal rate,regular rhythmand normal heart sounds.  Pulmonary/Chest:Effort normaland breath sounds normal. Norespiratory distress. She hasno wheezes. She hasno rales.No stridor Musculoskeletal: She exhibits noedema.  Neurological: She isalertand oriented to person, place, and time.  Skin: Skin iswarmand dry.  Psychiatric: She has anormal mood and affect. Herbehavior is normal.   Recent Labs    01/24/18 0630  WBC 11.8*  HGB 12.9  HCT 38.5  PLT 183   Recent Labs    01/24/18 0903 01/24/18 1327  NA 133* 134*  K 3.8 2.8*  CL 99* 97*  CO2 18* 24  GLUCOSE 73 89  BUN 20 18  CREATININE 1.48* 1.45*  CALCIUM 7.9* 8.6*    Medications:  I have reviewed the patient's current medications. Scheduled: . amLODipine  10 mg Oral Daily  . chlorthalidone  50 mg Oral Daily  . pantoprazole  40 mg Intravenous Q12H   Continuous: . 0.9 % NaCl with KCl 40 mEq / L 75 mL/hr (01/24/18 1422)  . [START ON 01/25/2018] cefTRIAXone (ROCEPHIN)  IV       Assessment/Plan: Previous necrotic tongue lesions have healed. Now with a new hypervascular lesion at the anterior tongue. This is likely secondary to vasculitis/autoimmune causes. No surgical intervention recommended at this time. Pt instructed to apply local pressure with future bleeding. Awaiting transfer to [facility] rheumatology.    LOS: 0 days   Su W Teoh 01/24/2018, 3:34 PM

## 2018-01-24 NOTE — ED Notes (Signed)
Administration times are not lined up for potassium. Patient is on her 2nd run.

## 2018-01-24 NOTE — ED Notes (Signed)
Culture tube sent with urine

## 2018-01-24 NOTE — ED Notes (Signed)
Alyssa, PA has advised to stop Cardene infusion if the next titration time, the BP is still downtrending

## 2018-01-24 NOTE — ED Provider Notes (Signed)
[facility] DEPT Provider Note   CSN: [id number] Arrival date & time: 01/24/18  0608     History   Chief Complaint Chief Complaint  Patient presents with  . Hematemesis  . Abdominal Pain    HPI Linda [name] is a 22 y.o. female.  HPI  Patient is a 22 year old female with a history of SLE, MC TD, hypertension, angioedema on ACE inhibitors, presenting for diffuse abdominal discomfort.  Patient reports that she checked in in the middle the night last night due to some bleeding of her tongue, but left before being seen.  Patient reports that she woke up with sudden onset diffuse abdominal pain at approximately 5 AM this morning.  Patient reports multiple episodes of emesis since then, first with clots of blood, and then streaks of blood in the vomit.  Patient denies any focality to her abdominal pain, but does report that it seems to be worse across the epigastrium.  Patient does not recall when her last bowel movement was, but denies any melena or hematochezia.  Patient denies fevers, but does report that she was chilled at the onset of her pain.  Patient reports some dysuria over the last couple days but no urgency or frequency.  No vaginal discharge, suprapubic tenderness, or pelvic pain.  Patient reports that her pain feels similar to prior episode of lupus exacerbation when she had some colitis, per patient.  Patient reports that her only immunosuppressant is prednisone, and she began a 60 mg dose 2 weeks ago, but has been on a taper for the past week.  Patient reports she is sexually active with one female partner.  No abdominal surgical history.  Of note, patient reports that she is unable to take her blood pressure medications this morning due to the emesis.  Past Medical History:  Diagnosis Date  . Acute kidney injury ([place])   . Angioedema   . Hypertension   . Lupus ([place])   . Migraines   . Mixed connective tissue disease [facility])     Patient Active  Problem List   Diagnosis Date Noted  . Tongue swelling 01/07/2018  . Sepsis ([place]) 01/06/2018  . Angioedema 01/02/2018  . Tachycardia 01/02/2018  . Acute kidney injury ([place])   . Hypertensive urgency 11/29/2017  . Enteritis 11/29/2017  . Nausea vomiting and diarrhea 11/29/2017  . Leukocytosis 11/29/2017  . Septic prepatellar bursitis of right knee 08/19/2017  . Grief reaction 08/19/2017  . Right knee pain   . Elbow pain, right   . Back pain 05/04/2017  . Bilateral knee swelling 04/20/2017  . History of connective tissue disease   . Leg swelling   . Abdominal pain   . Lupus erythematosus   . Cellulitis 04/09/2017  . Primary hypertension 12/11/2016  . Mixed connective tissue disease ([place]) 02/11/2016  . High BMI 01/19/2014  . Implanon in place 01/19/2014    Past Surgical History:  Procedure Laterality Date  . I&D EXTREMITY Right 08/06/2017   Procedure: IRRIGATION AND DEBRIDEMENT KNEE;  Surgeon: Shona Needles, MD;  Location: [facility];  Service: Orthopedics;  Laterality: Right;  . MULTIPLE TOOTH EXTRACTIONS       OB History    Gravida  0   Para  0   Term  0   Preterm  0   AB  0   Living  0     SAB  0   TAB  0   Ectopic  0   Multiple  0  Live Births               Home Medications    Prior to Admission medications   Medication Sig Start Date End Date Taking? Authorizing Provider  amLODipine (NORVASC) 10 MG tablet Take 1 tablet (10 mg total) by mouth daily. 01/03/18   Doreatha Lew, MD  carvedilol (COREG CR) 10 MG 24 hr capsule Take 1 capsule (10 mg total) by mouth daily. 01/04/18   Doreatha Lew, MD  chlorthalidone (HYGROTON) 50 MG tablet Take 1 tablet (50 mg total) by mouth daily. 01/04/18   Doreatha Lew, MD  fluconazole (DIFLUCAN) 100 MG tablet Take 1 tablet (100 mg total) by mouth daily. 01/08/18   Charlynne Cousins, MD  magic mouthwash w/lidocaine SOLN Take 5 mLs by mouth 4 (four) times daily for 14 days. Swish solution in your mouth  for 1-2 minutes and then spit out. 01/18/18 02/01/18  Robinson, [place] N, PA-C  predniSONE (DELTASONE) 20 MG tablet Take 3 tablets (60 mg total) by mouth daily with breakfast. 01/09/18   Charlynne Cousins, MD    Family History Family History  Problem Relation Age of Onset  . Lupus Mother   . Hypertension Mother   . Kidney disease Mother   . Diabetes Maternal Grandmother     Social History Social History   Tobacco Use  . Smoking status: Current Every Day Smoker    Packs/day: 0.00    Types: Cigarettes, Cigars    Start date: 02/11/2015  . Smokeless tobacco: Never Used  Substance Use Topics  . Alcohol use: No    Alcohol/week: 0.0 oz    Comment: social  . Drug use: No     Allergies   Lisinopril and Vicodin [hydrocodone-acetaminophen]   Review of Systems Review of Systems  Constitutional: Positive for chills. Negative for fever.  HENT: Negative for congestion, rhinorrhea, sinus pain and sore throat.   Eyes: Negative for visual disturbance.  Respiratory: Negative for cough, chest tightness and shortness of breath.   Cardiovascular: Negative for chest pain and palpitations.  Gastrointestinal: Positive for abdominal pain, constipation, nausea and vomiting. Negative for blood in stool and diarrhea.  Genitourinary: Positive for dysuria. Negative for flank pain.  Musculoskeletal: Negative for back pain and myalgias.  Neurological: Negative for dizziness, syncope, light-headedness and headaches.  All other systems reviewed and are negative.    Physical Exam Updated Vital Signs BP (!) 190/159 (BP Location: Left Arm)   Pulse 95   Temp 98.2 F (36.8 C) (Oral)   Resp 16   LMP 01/19/2018 (Approximate) Comment: negative HCG blood test 01-24-2018  SpO2 100%   Physical Exam  Constitutional: She appears well-developed and well-nourished. No distress.  HENT:  Head: Normocephalic and atraumatic.  Patient exhibits an ulceration of tongue on the right anterior side of tongue. Blood  clot overlying.  Eyes: Pupils are equal, round, and reactive to light. Conjunctivae and EOM are normal.  Neck: Normal range of motion. Neck supple.  Cardiovascular: Normal rate, regular rhythm, S1 normal and S2 normal.  No murmur heard. Pulmonary/Chest: Effort normal and breath sounds normal. She has no wheezes. She has no rales.  Abdominal: Soft. Normal appearance and bowel sounds are normal. She exhibits no distension. There is generalized tenderness and tenderness in the right upper quadrant and epigastric area. There is no guarding.  Abdomen tympanitic to percussion.  Musculoskeletal: Normal range of motion. She exhibits no edema or deformity.  Lymphadenopathy:    She has no cervical  adenopathy.  Neurological: She is alert.  Cranial nerves grossly intact. Patient moves extremities symmetrically and with good coordination.  Skin: Skin is warm and dry. Rash noted. No erythema.  Generalized xerosis and scarring of skin.  No focal erythema.  Psychiatric: She has a normal mood and affect. Her behavior is normal. Judgment and thought content normal.  Nursing note and vitals reviewed.    ED Treatments / Results  Labs (all labs ordered are listed, but only abnormal results are displayed) Labs Reviewed  COMPREHENSIVE METABOLIC PANEL - Abnormal; Notable for the following components:      Result Value   Sodium 129 (*)    Potassium 2.4 (*)    Chloride 88 (*)    Creatinine, Ser 1.57 (*)    Total Bilirubin 1.5 (*)    GFR calc non Af Amer 46 (*)    GFR calc Af Amer 53 (*)    All other components within normal limits  CBC WITH DIFFERENTIAL/PLATELET - Abnormal; Notable for the following components:   WBC 11.8 (*)    Neutro Abs 8.1 (*)    Monocytes Absolute 1.2 (*)    All other components within normal limits  LIPASE, BLOOD  URINALYSIS, ROUTINE W REFLEX MICROSCOPIC  MAGNESIUM  RAPID URINE DRUG SCREEN, HOSP PERFORMED  I-STAT BETA HCG BLOOD, ED ([facility], [facility], [facility] ONLY)  I-STAT TROPONIN, ED    I-STAT CG4 LACTIC ACID, ED  I-STAT CG4 LACTIC ACID, ED    EKG EKG Interpretation  Date/Time:  Monday Jan 24 2018 07:30:39 EDT Ventricular Rate:  96 PR Interval:    QRS Duration: 103 QT Interval:  335 QTC Calculation: 424 R Axis:   55 Text Interpretation:  Sinus rhythm Biatrial enlargement Abnormal T, consider ischemia, diffuse leads; NEW Borderline ST elevation, anterior leads, SEEN ON PREVIOUS NO STEMI Confirmed by Addison Lank [phone]) on 01/24/2018 7:46:37 AM   Radiology Dg Chest 1 View  Result Date: 01/24/2018 CLINICAL DATA:  Hematemesis.  Epigastric pain. EXAM: CHEST  1 VIEW COMPARISON:  Two-view chest x-ray 11/29/2017 FINDINGS: Heart remains mildly enlarged. There is no edema or effusion. No focal airspace disease is present. The visualized soft tissues and bony thorax are unremarkable. IMPRESSION: 1. Mild cardiomegaly without failure. 2. No acute cardiopulmonary disease. Electronically Signed   By: San Morelle M.D.   On: 01/24/2018 07:34    Procedures Procedures (including critical care time)  Medications Ordered in ED Medications  sodium chloride 0.9 % bolus 1,000 mL (1,000 mLs Intravenous New Bag/Given 01/24/18 0742)  potassium chloride 10 mEq in 100 mL IVPB (has no administration in time range)  magnesium sulfate IVPB 1 g 100 mL (has no administration in time range)  HYDROmorphone (DILAUDID) injection 1 mg (has no administration in time range)  iopamidol (ISOVUE-300) 61 % injection (has no administration in time range)  morphine 4 MG/ML injection 4 mg (4 mg Intravenous Given 01/24/18 0742)  metoCLOPramide (REGLAN) injection 10 mg (10 mg Intravenous Given 01/24/18 0741)  diphenhydrAMINE (BENADRYL) injection 25 mg (25 mg Intravenous Given 01/24/18 0741)  iopamidol (ISOVUE-300) 61 % injection 100 mL (100 mLs Intravenous Contrast Given 01/24/18 0834)     Initial Impression / Assessment and Plan / ED Course  I have reviewed the triage vital signs and the nursing  notes.  Pertinent labs & imaging results that were available during my care of the patient were reviewed by me and considered in my medical decision making (see chart for details).  Clinical Course as of Jan 25 1548  Mon Jan 24, 2018  1610 Reviewed old EKG. Shows new T wave depression in anterior leads.   [AM]  [phone] Received information from RN that patient has potassium 2.4.  Magnesium level and magnesium and potassium supplementation ordered.   [AM]  5409 Blood pressure rechecked manually by me, and found to be 184/126.   [AM]  [phone] Reassessed patient. BP 176/134.  Will start Nicardipine drip.   [AM]  1478 Blood pressure now 141/99. Discussed downtitration of BP with Rn.    [AM]  1300 Spoke with Dr. Ferdinand Lango of [facility] ICU who states they would be happy to take the patient when they have a bed available but there are no ICU beds on a Nicardipine drip. Also spoke with Dr. Rubie Maid, Rheumatology fellow who stated that they would be happy to consult on patient after transfer. Will seek admission here.   [AM]  1448 Patient weaned off of Cardene drip.   [AM]    Clinical Course User Index [AM] Albesa Seen, PA-C   On initial examination, patient nontoxic-appearing, but significantly uncomfortable diffuse abdominal tenderness, and significantly hypertensive.  Given the patient had EKG changes with anterior T wave inversions and ST depressions, patient in hypertensive emergency.  Pain control initially attempted, but patient ultimately required nicardipine drip.  Blood pressure controlled with this approach.  Potassium 2.4, repleted orally and per IV.  Magnesium also supplemented.  Patient exhibiting leukocytosis of 11.8 with neutrophils 8.1, which could be secondary to intra-abdominal process or recent steroid treatments.  Lab work otherwise remarkable for slightly elevated creatinine from baseline at 1.57, but see below for trends.  Urinalysis demonstrates moderate leukocytes as well as  hematuria.  This could be secondary to lupus, but may also be represent a urinary tract infection.  Will start Rocephin.  CT scan of the abdomen and pelvis demonstrating no free air.  Nonspecific inflammation around the duodenum, possibly gastritis, and Protonix ordered.  Patient also has some thickening of the bladder wall, which could be secondary to autoimmune process, or urinary tract infection.  Lab Results  Component Value Date   CREATININE 1.57 (H) 01/24/2018   CREATININE 1.47 (H) 01/07/2018   CREATININE 1.58 (H) 01/06/2018   Patient to be admitted for hypertensive emergency, and reported hematemesis and abdominal pain.  This is a shared visit with Dr. Addison Lank. Patient was independently evaluated by this attending physician. Attending physician consulted in evaluation and admission management.  11:59 AM Spoke with Dr. Allyson Sabal of [facility] who recommends transfer to [facility], as patient has need for monoclonal antibodies per Rheumatologist Dr. Gerilyn Nestle that are not available at [facility].  Patient to require admission to hospitalist service at [facility]. Discussed with Dr. Addison Lank.   Final Clinical Impressions(s) / ED Diagnoses   Final diagnoses:  Hypertensive emergency  Urinary tract infection with hematuria, site unspecified  Duodenitis  Non-intractable vomiting with nausea, unspecified vomiting type  Tongue ulcer    ED Discharge Orders    None       Tamala Julian 01/24/18 1549    Fatima Blank, MD 01/25/18 [phone]

## 2018-01-24 NOTE — ED Notes (Signed)
Bed: LK56 Expected date:  Expected time:  Means of arrival:  Comments: 21 f vomiting blood

## 2018-01-24 NOTE — ED Triage Notes (Signed)
Pt reports hematemesis x1 day accompanied by generalized abdominal pain. A&Ox4. Hx lupus and HTN. She has not yet taken her AM meds.

## 2018-01-24 NOTE — ED Notes (Signed)
Patient given water and female urinal per patient's request.

## 2018-01-25 LAB — COMPREHENSIVE METABOLIC PANEL
ALT: 13 U/L — ABNORMAL LOW (ref 14–54)
AST: 14 U/L — ABNORMAL LOW (ref 15–41)
Albumin: 3.3 g/dL — ABNORMAL LOW (ref 3.5–5.0)
Alkaline Phosphatase: 48 U/L (ref 38–126)
Anion gap: 11 (ref 5–15)
BUN: 16 mg/dL (ref 6–20)
CO2: 25 mmol/L (ref 22–32)
Calcium: 9.2 mg/dL (ref 8.9–10.3)
Chloride: 100 mmol/L — ABNORMAL LOW (ref 101–111)
Creatinine, Ser: 1.37 mg/dL — ABNORMAL HIGH (ref 0.44–1.00)
GFR calc Af Amer: >60 mL/min (ref >60)
GFR calc non Af Amer: 54 mL/min — ABNORMAL LOW (ref >60)
Glucose, Bld: 112 mg/dL — ABNORMAL HIGH (ref 65–99)
Potassium: 3.5 mmol/L (ref 3.5–5.1)
Sodium: 136 mmol/L (ref 135–145)
Total Bilirubin: 0.9 mg/dL (ref 0.3–1.2)
Total Protein: 6.6 g/dL (ref 6.5–8.1)

## 2018-01-25 LAB — CBC
HCT: 32.1 % — ABNORMAL LOW (ref 36.0–46.0)
HCT: 35.3 % — ABNORMAL LOW (ref 36.0–46.0)
Hemoglobin: 10.4 g/dL — ABNORMAL LOW (ref 12.0–15.0)
Hemoglobin: 11.4 g/dL — ABNORMAL LOW (ref 12.0–15.0)
MCH: 31 pg (ref 26.0–34.0)
MCH: 31.4 pg (ref 26.0–34.0)
MCHC: 32.4 g/dL (ref 30.0–36.0)
MCHC: 32.3 g/dL (ref 30.0–36.0)
MCV: 95.8 fL (ref 78.0–100.0)
MCV: 97.2 fL (ref 78.0–100.0)
Platelets: 134 10*3/uL — ABNORMAL LOW (ref 150–400)
Platelets: 137 10*3/uL — ABNORMAL LOW (ref 150–400)
RBC: 3.35 MIL/uL — ABNORMAL LOW (ref 3.87–5.11)
RBC: 3.63 MIL/uL — ABNORMAL LOW (ref 3.87–5.11)
RDW: 16.1 % — ABNORMAL HIGH (ref 11.5–15.5)
RDW: 16 % — ABNORMAL HIGH (ref 11.5–15.5)
WBC: 10.3 10*3/uL (ref 4.0–10.5)
WBC: 12.5 10*3/uL — ABNORMAL HIGH (ref 4.0–10.5)

## 2018-01-25 LAB — URINE CULTURE: Culture: 60000 — AB

## 2018-01-25 LAB — PROTIME-INR
INR: 1.01
Prothrombin Time: 13.2 seconds (ref 11.4–15.2)

## 2018-01-25 MED ORDER — AMOXICILLIN 250 MG PO CAPS
500.0000 mg | ORAL_CAPSULE | Freq: Two times a day (BID) | ORAL | Status: AC
  Administered 2018-01-25 – 2018-01-28 (×6): 500 mg via ORAL
  Filled 2018-01-25 (×6): qty 2

## 2018-01-25 MED ORDER — HYDROMORPHONE HCL 1 MG/ML IJ SOLN
0.5000 mg | Freq: Once | INTRAMUSCULAR | Status: AC
  Administered 2018-01-25: 0.5 mg via INTRAVENOUS
  Filled 2018-01-25: qty 0.5

## 2018-01-25 MED ORDER — PANTOPRAZOLE SODIUM 40 MG PO TBEC
40.0000 mg | DELAYED_RELEASE_TABLET | Freq: Two times a day (BID) | ORAL | Status: DC
  Administered 2018-01-25 – 2018-01-28 (×6): 40 mg via ORAL
  Filled 2018-01-25 (×6): qty 1

## 2018-01-25 MED ORDER — HYDROMORPHONE HCL 1 MG/ML IJ SOLN
1.0000 mg | INTRAMUSCULAR | Status: DC | PRN
  Administered 2018-01-25 – 2018-01-27 (×10): 1 mg via INTRAVENOUS
  Filled 2018-01-25 (×10): qty 1

## 2018-01-25 MED ORDER — METHYLPREDNISOLONE SODIUM SUCC 125 MG IJ SOLR
60.0000 mg | Freq: Every day | INTRAMUSCULAR | Status: DC
  Administered 2018-01-25 – 2018-01-28 (×4): 60 mg via INTRAVENOUS
  Filled 2018-01-25 (×4): qty 2

## 2018-01-25 MED ORDER — HYDROMORPHONE HCL 1 MG/ML IJ SOLN
0.5000 mg | INTRAMUSCULAR | Status: DC | PRN
Start: 2018-01-25 — End: 2018-01-25
  Administered 2018-01-25: 0.5 mg via INTRAVENOUS
  Filled 2018-01-25: qty 0.5

## 2018-01-25 NOTE — Progress Notes (Addendum)
PROGRESS NOTE    Linda [name]  WLN:[id number] DOB: October 08, 1995 DOA: 01/24/2018 PCP: Tonette Bihari, MD   Brief Narrative: Patient is a 22 year old female with past medical history of mixed connective tissue disease/discoid lupus being followed at [facility], hypertension who presents to the emergency department with complaints of profuse bleeding from her tongue for last 2 days.  She was evaluated by her rheumatologist recently and it was felt that the patient's tongue ulcer was secondary to lupus.  Currently the bleeding from the tongue has stopped.  ENT has already evaluated the patient here.  Patient was also severely hypertensive on presentation.  There is a plan to transfer the patient to [facility], but the transfer has been declined by the accepting physician over there because currently her status is stable and she can follow-up as an outpatient with rheumatology.  Assessment & Plan:   Principal Problem:   Tongue ulceration Active Problems:   History of connective tissue disease   Hypertensive urgency   Enteritis  Bleeding from the tongue/tongue ulcer: Thought to be secondary to SLE lesion .Bleeding has stopped since last night.  Patient has already been evaluated by ENT.  Found to have a new hypervascular lesion of the anterior tongue consistent with vasculitis/autoimmune causes.  No surgical intervention recommended.  Bleeding stopped with application of local pressure and supportive care.  Mixed connective tissue tissue disease/discoid lupus: Follows with rheumatologist at [facility]. Gerilyn Nestle.  I discussed with rheumatology fellow Dr. Rubie Maid  this morning who says that the patient does not need to be transferred there and she can follow as an outpatient.  They will schedule her outpatient appointment within a few days after discharge from here.  We will continue IV Solu-Medrol at current dose until she is discharged.  Upon discharge she should be on 60 mg  of prednisone daily for a week followed by 40 mg of prednisone for a week followed by 20 mg of prednisone for 2 weeks.  She has been planned to be started on Benlysta 200 mg SQ weekly.  Abdominal pain: This is a chronic problem. She continues to ask for pain medications.There cud be  component of pain medication seeking behavior too. Patient was admitted few weeks ago for abdominal pain and was found to have enteritis secondary to lupus.  Anticipate improvement of abdominal pain with steroids.  Continue pain management as necessary for now.  CT shows wall thickening in the distal transverse duodenum, suggesting nonspecific inflammation Started  on Protonix.  Currently she is on clear liquid diet.  Gradually advance.  Hypertensive urgency: Chronic issue.  Patient's home medications have been restarted.  Continue to monitor blood pressure.  She was started on nicardipine drip on presentation which has been stopped.  Acute kidney injury/new onset CKD: Patient's renal function was normal few months ago but recently her creatinine have been in the range of 1.2-1.3 .Continue gentle IV fluids.  She might have developed CKD due to uncontrolled hypertension and lupus.  UTI: Urine culture grew Streptococcus agalactiae.  We will treat her with short course of  Amoxicillin.  Leukocytosis: Most likely secondary to steroids or could be reactive.  Continue to monitor     DVT prophylaxis:SCD Code Status: Full Family Communication: None present at the bedside Disposition Plan: Home after resolution of abdominal pain   Consultants: None  Procedures: None  Antimicrobials: Amoxicillin  Subjective: Patient seen and examined the bedside this morning.  Blood pressure has improved.  She complains  of severe abdominal pain which is her usual complaints.  Objective: Vitals:   01/25/18 0524 01/25/18 0949 01/25/18 1226 01/25/18 1343  BP: (!) 137/107 (!) 135/106  (!) 143/113  Pulse: 67   75  Resp: 16   16    Temp: 98.3 F (36.8 C)   98.3 F (36.8 C)  TempSrc: Oral   Oral  SpO2: 100%  97% 97%  Weight:      Height:        Intake/Output Summary (Last 24 hours) at 01/25/2018 1552 Last data filed at 01/25/2018 1518 Gross per 24 hour  Intake 2232.5 ml  Output 3200 ml  Net -967.5 ml   Filed Weights   01/24/18 1541  Weight: 84 kg (185 lb 3 oz)    Examination:  General exam: Appears uncomfortable with abdominal pain HEENT:PERRL,Oral mucosa moist, Ear/Nose normal on gross exam Respiratory system: Bilateral equal air entry, normal vesicular breath sounds, no wheezes or crackles  Cardiovascular system: S1 & S2 heard, RRR. No JVD, murmurs, rubs, gallops or clicks. No pedal edema. Gastrointestinal system: Abdomen is nondistended, soft,nontender. No organomegaly or masses felt. Normal bowel sounds heard. Central nervous system: Alert and oriented. No focal neurological deficits. Extremities: No edema, no clubbing ,no cyanosis, distal peripheral pulses palpable.   Skin: Old scars on bilateral lower extremities, healed rashes from discoid lupus MSK: Normal muscle bulk,tone ,power      Data Reviewed: I have personally reviewed following labs and imaging studies  CBC: Recent Labs  Lab 01/24/18 0630 01/24/18 1619 01/24/18 2158 01/25/18 0535 01/25/18 1034  WBC 11.8* 11.9* 9.3 10.3 12.5*  NEUTROABS 8.1*  --   --   --   --   HGB 12.9 14.6 13.3 10.4* 11.4*  HCT 38.5 43.8 39.3 32.1* 35.3*  MCV 94.1 96.1 95.2 95.8 97.2  PLT 183 159 143* 134* 419*   Basic Metabolic Panel: Recent Labs  Lab 01/24/18 0623 01/24/18 0903 01/24/18 1327 01/25/18 0534  NA 129* 133* 134* 136  K 2.4* 3.8 2.8* 3.5  CL 88* 99* 97* 100*  CO2 26 18* 24 25  GLUCOSE 87 73 89 112*  BUN 20 20 18 16   CREATININE 1.57* 1.48* 1.45* 1.37*  CALCIUM 9.2 7.9* 8.6* 9.2  MG  --  1.5*  --   --    GFR: Estimated Creatinine Clearance: 70.4 mL/min (A) (by C-G formula based on SCr of 1.37 mg/dL (H)). Liver Function  Tests: Recent Labs  Lab 01/24/18 0623 01/25/18 0534  AST 18 14*  ALT 15 13*  ALKPHOS 60 48  BILITOT 1.5* 0.9  PROT 7.7 6.6  ALBUMIN 3.9 3.3*   Recent Labs  Lab 01/24/18 0623  LIPASE 28   No results for input(s): AMMONIA in the last 168 hours. Coagulation Profile: Recent Labs  Lab 01/25/18 0534  INR 1.01   Cardiac Enzymes: No results for input(s): CKTOTAL, CKMB, CKMBINDEX, TROPONINI in the last 168 hours. BNP (last 3 results) No results for input(s): PROBNP in the last 8760 hours. HbA1C: No results for input(s): HGBA1C in the last 72 hours. CBG: No results for input(s): GLUCAP in the last 168 hours. Lipid Profile: No results for input(s): CHOL, HDL, LDLCALC, TRIG, CHOLHDL, LDLDIRECT in the last 72 hours. Thyroid Function Tests: No results for input(s): TSH, T4TOTAL, FREET4, T3FREE, THYROIDAB in the last 72 hours. Anemia Panel: No results for input(s): VITAMINB12, FOLATE, FERRITIN, TIBC, IRON, RETICCTPCT in the last 72 hours. Sepsis Labs: Recent Labs  Lab 01/24/18 0753  LATICACIDVEN 1.31  Recent Results (from the past 240 hour(s))  Urine culture     Status: Abnormal   Collection Time: 01/24/18 10:27 AM  Result Value Ref Range Status   Specimen Description   Final    URINE, RANDOM Performed at [facility] [address], [place], [place] [address]    Special Requests   Final    NONE Performed at [facility], [place] [address], [place], [place] [address]    Culture (A)  Final    60,000 COLONIES/mL GROUP B STREP(S.AGALACTIAE)ISOLATED TESTING AGAINST S. AGALACTIAE NOT ROUTINELY PERFORMED DUE TO PREDICTABILITY OF AMP/PEN/VAN SUSCEPTIBILITY. Performed at [facility], [place] [address], [place], [place] [address]    Report Status 01/25/2018 FINAL  Final         Radiology Studies: Dg Chest 1 View  Result Date: 01/24/2018 CLINICAL DATA:  Hematemesis.  Epigastric pain. EXAM: CHEST  1 VIEW COMPARISON:  Two-view  chest x-ray 11/29/2017 FINDINGS: Heart remains mildly enlarged. There is no edema or effusion. No focal airspace disease is present. The visualized soft tissues and bony thorax are unremarkable. IMPRESSION: 1. Mild cardiomegaly without failure. 2. No acute cardiopulmonary disease. Electronically Signed   By: San Morelle M.D.   On: 01/24/2018 07:34   Ct Abdomen Pelvis W Contrast  Result Date: 01/24/2018 CLINICAL DATA:  Pt reports hematemesis x1 day accompanied by generalized abdominal pain, Hx lupus and HTN EXAM: CT ABDOMEN AND PELVIS WITH CONTRAST TECHNIQUE: Multidetector CT imaging of the abdomen and pelvis was performed using the standard protocol following bolus administration of intravenous contrast. CONTRAST:  110mL ISOVUE-300 IOPAMIDOL (ISOVUE-300) INJECTION 61% COMPARISON:  11/29/2017 FINDINGS: Lower chest: Previous pericardial effusion has resolved. Mild cardiomegaly. Interventricular septal thickness 2.0 cm, query left ventricular hypertrophy. Hepatobiliary: Stable 0.7 by 0.5 cm hypodense lesion in segment IV a of the liver on image 13/2, statistically likely to be a cyst or similar small benign lesion but technically nonspecific. The liver and gallbladder appear otherwise unremarkable. Pancreas: Unremarkable Spleen: Unremarkable Adrenals/Urinary Tract: Cortical thinning in both kidneys. Wall thickening in the urinary bladder especially along the right upper urinary bladder as shown on image 58/5. Stomach/Bowel: The appendix sits down in the pelvis just above the urinary bladder. The appendix is seen above the cecum on image 52/4 and appears normal. I do not see an inflammatory process involving the cecum, terminal ileum, or appendix to suggest that the right bladder roof wall thickening is due to secondary inflammation. There is wall thickening in the distal third portion of the duodenum on images 27 through 36 of series 2. Vascular/Lymphatic: Unremarkable Reproductive: Unremarkable Other:  Trace free pelvic fluid, likely physiologic. Musculoskeletal: No CT findings of avascular necrosis of the hips. 4 mm anterolisthesis associated with bilateral chronic pars defects at L5. Small central disc protrusion at L4-5. IMPRESSION: 1. Abnormal wall thickening along the right anterior roof of the urinary bladder with very faint stranding in the adjacent adipose tissues. No adjacent bowel abnormality is identified in the appendix appears normal. This could be an unusual manifestation of lupus cystitis, tuberculous cystitis, eosinophilic cystitis, hemorrhage into the bladder wall, or malakoplakia. 2. There is mild wall thickening in the distal transverse duodenum extending up towards the force portion of the duodenum. Appearance favors nonspecific inflammation. 3. Mild cardiomegaly. Previous pericardial effusion has resolved. Thickened interventricular septum, query left ventricular hypertrophy. 6 mm hypodense lesion in segment IV a of the liver, probably a small cyst but technically nonspecific due to small size. 4.  Cortical thinning in both kidneys. 5. Bilateral chronic pars defects at L5 with 4 mm anterolisthesis of L5 on S1. Small disc protrusion at L4-5. Electronically Signed   By: Van Clines M.D.   On: 01/24/2018 09:13        Scheduled Meds: . amLODipine  10 mg Oral Daily  . amoxicillin  500 mg Oral Q12H  . carvedilol  10 mg Oral Daily  . chlorthalidone  50 mg Oral Daily  . cloNIDine  0.1 mg Oral TID  . magic mouthwash w/lidocaine  5 mL Oral QID  . methylPREDNISolone (SOLU-MEDROL) injection  60 mg Intravenous Daily  . pantoprazole  40 mg Oral BID   Continuous Infusions: . sodium chloride       LOS: 1 day    Time spent: 35 mins.More than 50% of that time was spent in counseling and/or coordination of care.      Shelly Coss, MD [facility] Pager [phone]  If 7PM-7AM, please contact night-coverage [email or web address] Password [email or web address] 01/25/2018, 3:52 PM

## 2018-01-25 NOTE — Plan of Care (Signed)
  Problem: Health Behavior/Discharge Planning: Goal: Ability to manage health-related needs will improve Outcome: Progressing   Problem: Nutrition: Goal: Adequate nutrition will be maintained Outcome: Progressing   Problem: Elimination: Goal: Will not experience complications related to urinary retention Outcome: Progressing   Problem: Pain Managment: Goal: General experience of comfort will improve Outcome: Progressing   Problem: Safety: Goal: Ability to remain free from injury will improve Outcome: Progressing   Problem: Skin Integrity: Goal: Risk for impaired skin integrity will decrease Outcome: Progressing

## 2018-01-26 DIAGNOSIS — K529 Noninfective gastroenteritis and colitis, unspecified: Secondary | ICD-10-CM

## 2018-01-26 DIAGNOSIS — Z8739 Personal history of other diseases of the musculoskeletal system and connective tissue: Secondary | ICD-10-CM

## 2018-01-26 DIAGNOSIS — I160 Hypertensive urgency: Secondary | ICD-10-CM

## 2018-01-26 DIAGNOSIS — K140 Glossitis: Secondary | ICD-10-CM

## 2018-01-26 LAB — BASIC METABOLIC PANEL
Anion gap: 12 (ref 5–15)
BUN: 15 mg/dL (ref 6–20)
CO2: 24 mmol/L (ref 22–32)
Calcium: 9.3 mg/dL (ref 8.9–10.3)
Chloride: 97 mmol/L — ABNORMAL LOW (ref 101–111)
Creatinine, Ser: 1.35 mg/dL — ABNORMAL HIGH (ref 0.44–1.00)
GFR calc Af Amer: >60 mL/min (ref >60)
GFR calc non Af Amer: 55 mL/min — ABNORMAL LOW (ref >60)
Glucose, Bld: 122 mg/dL — ABNORMAL HIGH (ref 65–99)
Potassium: 3.5 mmol/L (ref 3.5–5.1)
Sodium: 133 mmol/L — ABNORMAL LOW (ref 135–145)

## 2018-01-26 LAB — CBC WITH DIFFERENTIAL/PLATELET
Basophils Absolute: 0 10*3/uL (ref 0.0–0.1)
Basophils Relative: 0 %
Eosinophils Absolute: 0 10*3/uL (ref 0.0–0.7)
Eosinophils Relative: 0 %
HCT: 34.8 % — ABNORMAL LOW (ref 36.0–46.0)
Hemoglobin: 11 g/dL — ABNORMAL LOW (ref 12.0–15.0)
Lymphocytes Relative: 9 %
Lymphs Abs: 0.9 10*3/uL (ref 0.7–4.0)
MCH: 31.1 pg (ref 26.0–34.0)
MCHC: 31.6 g/dL (ref 30.0–36.0)
MCV: 98.3 fL (ref 78.0–100.0)
Monocytes Absolute: 0.3 10*3/uL (ref 0.1–1.0)
Monocytes Relative: 3 %
Neutro Abs: 9.1 10*3/uL — ABNORMAL HIGH (ref 1.7–7.7)
Neutrophils Relative %: 88 %
Platelets: 128 10*3/uL — ABNORMAL LOW (ref 150–400)
RBC: 3.54 MIL/uL — ABNORMAL LOW (ref 3.87–5.11)
RDW: 16.2 % — ABNORMAL HIGH (ref 11.5–15.5)
WBC: 10.4 10*3/uL (ref 4.0–10.5)

## 2018-01-26 MED ORDER — HYDRALAZINE HCL 20 MG/ML IJ SOLN
10.0000 mg | Freq: Four times a day (QID) | INTRAMUSCULAR | Status: DC | PRN
  Administered 2018-01-28: 10 mg via INTRAVENOUS
  Filled 2018-01-26: qty 1

## 2018-01-26 NOTE — Progress Notes (Signed)
 PROGRESS NOTE  Linda [name] MRN:[id number] DOB: 07/12/1996 DOA: 01/24/2018 PCP: Mikell, Asiyah Zahra, MD  HPI/Recap of past 24 hours: Patient is a 22-year-old female with medical history significant for mixed connective tissue disease/discoid lupus being followed at [facility], hypertension presents to the ER with complaints of profuse bleeding from her tongue ulcer for the past 2 days.  Patient was evaluated by her rheumatologist recently, reported tongue ulcers likely from lupus.  Currently, with no bleeding.  ENT was consulted, recommended conservative measures by applying pressure.  Plan was to transfer patient to [facility], but has been declined by accepting physician at [facility], as patient is currently stable and can be followed up as an outpatient with rheumatology.  Today, patient still complained of severe generalized abdominal pain, currently on IV Dilaudid.  No diarrhea, nausea/vomiting, fever/chills noted.  ??Questionable pain seeking  Assessment/Plan: Principal Problem:   Tongue ulceration Active Problems:   History of connective tissue disease   Hypertensive urgency   Enteritis  Bleeding tongue ulcer Currently bleeding has stopped Tongue ulcer likely due to SLE ENT consulted, noted to have a new hypervascular lesion on the anterior tongue consistent with vasculitis.  Recommend supportive care, local pressure.  No surgical intervention recommended Monitor closely  Chronic abdominal pain Afebrile, resolved leukocytosis ??  Enteritis due to lupus CT abdomen shows wall thickening in the distal transverse duodenum suggesting nonspecific inflammation Continue Protonix, steroids, pain meds ??  Pain medication seeking behavior (requesting specifically Dilaudid)  AKI Improving Worsening creatinine for the past month ??  Progression to CKD (from lupus/uncontrolled hypertension) Status post IV fluids Daily BMP  Hypertensive crisis BP with better control Status post  nicardipine drip Continue home meds, IV hydralazine as needed Monitor closely  UTI Urine culture grew Streptococcus agalactiae Continue p.o. Amoxicillin  Normocytic anemia likely due to acute blood loss Stable Daily CBC  Mixed connective tissue disease/discoid lupus Follows with rheumatologist at [facility], Dr Ziolkowska Rheumatology fellow Dr. Sarah Min [facility] as patient can be followed as an outpatient Continue IV Solu-Medrol, upon discharge should be on 60 mg of prednisone a day for 1 week, followed by 40 mg of Pred for 1 week followed by 20 mg of Pred for 2 weeks.  Discussions about starting Benlysta 200 mg SQ weekly by rheumatologist     Code Status: Full  Family Communication: Discussed with mother over the phone  Disposition Plan: Once stable   Consultants:  Fellow at [facility]  Procedures:  None  Antimicrobials: Amoxicillin for UTI  DVT prophylaxis: SCDs   Objective: Vitals:   01/25/18 2023 01/26/18 0530 01/26/18 1509 01/26/18 1516  BP: (!) 130/103 (!) 136/108 (!) 142/106 (!) 139/120  Pulse: 68 68  (!) 59  Resp: 18 18  17  Temp: 97.8 F (36.6 C) 97.6 F (36.4 C)  97.9 F (36.6 C)  TempSrc: Oral Oral  Oral  SpO2: 98% 96%  100%  Weight:      Height:        Intake/Output Summary (Last 24 hours) at 01/26/2018 1752 Last data filed at 01/26/2018 1500 Gross per 24 hour  Intake 774.17 ml  Output 1800 ml  Net -1025.83 ml   Filed Weights   01/24/18 1541  Weight: 84 kg (185 lb 3 oz)    Exam:   General: Met patient sleeping, NAD  Cardiovascular: S1, S2 present  Respiratory: CTA B  Abdomen: Soft, nontender, nondistended, bowel sounds present  Musculoskeletal: No pedal edema   bilaterally  Skin: Noted old scars on bilateral lower extremities, likely from discoid lupus  Psychiatry: Normal mood   Data Reviewed: CBC: Recent Labs  Lab 01/24/18 0630 01/24/18 1619 01/24/18 2158 01/25/18 0535  01/25/18 1034 01/26/18 0523  WBC 11.8* 11.9* 9.3 10.3 12.5* 10.4  NEUTROABS 8.1*  --   --   --   --  9.1*  HGB 12.9 14.6 13.3 10.4* 11.4* 11.0*  HCT 38.5 43.8 39.3 32.1* 35.3* 34.8*  MCV 94.1 96.1 95.2 95.8 97.2 98.3  PLT 183 159 143* 134* 137* 035*   Basic Metabolic Panel: Recent Labs  Lab 01/24/18 0623 01/24/18 0903 01/24/18 1327 01/25/18 0534 01/26/18 0523  NA 129* 133* 134* 136 133*  K 2.4* 3.8 2.8* 3.5 3.5  CL 88* 99* 97* 100* 97*  CO2 26 18* _0 GLUCOSE 87 73 89 112* 122*  BUN _1 CREATININE 1.57* 1.48* 1.45* 1.37* 1.35*  CALCIUM 9.2 7.9* 8.6* 9.2 9.3  MG  --  1.5*  --   --   --    GFR: Estimated Creatinine Clearance: 71.4 mL/min (A) (by C-G formula based on SCr of 1.35 mg/dL (H)). Liver Function Tests: Recent Labs  Lab 01/24/18 0623 01/25/18 0534  AST 18 14*  ALT 15 13*  ALKPHOS 60 48  BILITOT 1.5* 0.9  PROT 7.7 6.6  ALBUMIN 3.9 3.3*   Recent Labs  Lab 01/24/18 0623  LIPASE 28   No results for input(s): AMMONIA in the last 168 hours. Coagulation Profile: Recent Labs  Lab 01/25/18 0534  INR 1.01   Cardiac Enzymes: No results for input(s): CKTOTAL, CKMB, CKMBINDEX, TROPONINI in the last 168 hours. BNP (last 3 results) No results for input(s): PROBNP in the last 8760 hours. HbA1C: No results for input(s): HGBA1C in the last 72 hours. CBG: No results for input(s): GLUCAP in the last 168 hours. Lipid Profile: No results for input(s): CHOL, HDL, LDLCALC, TRIG, CHOLHDL, LDLDIRECT in the last 72 hours. Thyroid Function Tests: No results for input(s): TSH, T4TOTAL, FREET4, T3FREE, THYROIDAB in the last 72 hours. Anemia Panel: No results for input(s): VITAMINB12, FOLATE, FERRITIN, TIBC, IRON, RETICCTPCT in the last 72 hours. Urine analysis:    Component Value Date/Time   COLORURINE YELLOW 01/24/2018 [place] 01/24/2018 1027   LABSPEC 1.021 01/24/2018 1027   PHURINE 6.0 01/24/2018 1027   GLUCOSEU NEGATIVE  01/24/2018 1027   HGBUR LARGE (A) 01/24/2018 1027   BILIRUBINUR NEGATIVE 01/24/2018 1027   KETONESUR 5 (A) 01/24/2018 1027   PROTEINUR 100 (A) 01/24/2018 1027   UROBILINOGEN 1.0 03/30/2015 2328   NITRITE NEGATIVE 01/24/2018 1027   LEUKOCYTESUR MODERATE (A) 01/24/2018 1027   Sepsis Labs: _2 (procalcitonin:4,lacticidven:4)  ) Recent Results (from the past 240 hour(s))  Urine culture     Status: Abnormal   Collection Time: 01/24/18 10:27 AM  Result Value Ref Range Status   Specimen Description   Final    URINE, RANDOM Performed at [facility], [place] [address], [place], [place] [address]    Special Requests   Final    NONE Performed at [facility], [place] [address], [place], [place] [address]    Culture (A)  Final    60,000 COLONIES/mL GROUP B STREP(S.AGALACTIAE)ISOLATED TESTING AGAINST S. AGALACTIAE NOT ROUTINELY PERFORMED DUE TO PREDICTABILITY OF AMP/PEN/VAN SUSCEPTIBILITY. Performed at [facility], [place] [address], [place], [place] [address]    Report Status 01/25/2018 FINAL  Final  Studies: No results found.  Scheduled Meds: . amLODipine  10 mg Oral Daily  . amoxicillin  500 mg Oral Q12H  . carvedilol  10 mg Oral Daily  . chlorthalidone  50 mg Oral Daily  . cloNIDine  0.1 mg Oral TID  . magic mouthwash w/lidocaine  5 mL Oral QID  . methylPREDNISolone (SOLU-MEDROL) injection  60 mg Intravenous Daily  . pantoprazole  40 mg Oral BID    Continuous Infusions: . sodium chloride 50 mL/hr at 01/25/18 2331     LOS: 2 days     Nkeiruka J Ezenduka, MD [facility]   If 7PM-7AM, please contact night-coverage [email or web address] Password [email or web address] 01/26/2018, 5:52 PM   

## 2018-01-26 NOTE — Plan of Care (Signed)
  Problem: Health Behavior/Discharge Planning: Goal: Ability to manage health-related needs will improve Outcome: Progressing   Problem: Clinical Measurements: Goal: Ability to maintain clinical measurements within normal limits will improve Outcome: Progressing Goal: Will remain free from infection Outcome: Progressing Goal: Diagnostic test results will improve Outcome: Progressing Goal: Respiratory complications will improve Outcome: Progressing Goal: Cardiovascular complication will be avoided Outcome: Progressing   Problem: Nutrition: Goal: Adequate nutrition will be maintained Outcome: Progressing   Problem: Elimination: Goal: Will not experience complications related to urinary retention Outcome: Progressing   Problem: Pain Managment: Goal: General experience of comfort will improve Outcome: Progressing   Problem: Safety: Goal: Ability to remain free from injury will improve Outcome: Progressing   Problem: Skin Integrity: Goal: Risk for impaired skin integrity will decrease Outcome: Progressing

## 2018-01-27 ENCOUNTER — Encounter (HOSPITAL_COMMUNITY): Payer: Self-pay | Admitting: Nephrology

## 2018-01-27 LAB — CBC WITH DIFFERENTIAL/PLATELET
Basophils Absolute: 0 10*3/uL (ref 0.0–0.1)
Basophils Relative: 0 %
Eosinophils Absolute: 0 10*3/uL (ref 0.0–0.7)
Eosinophils Relative: 0 %
HCT: 35.3 % — ABNORMAL LOW (ref 36.0–46.0)
Hemoglobin: 11.1 g/dL — ABNORMAL LOW (ref 12.0–15.0)
Lymphocytes Relative: 14 %
Lymphs Abs: 2.5 10*3/uL (ref 0.7–4.0)
MCH: 30.8 pg (ref 26.0–34.0)
MCHC: 31.4 g/dL (ref 30.0–36.0)
MCV: 98.1 fL (ref 78.0–100.0)
Monocytes Absolute: 1.4 10*3/uL — ABNORMAL HIGH (ref 0.1–1.0)
Monocytes Relative: 8 %
Neutro Abs: 13.6 10*3/uL — ABNORMAL HIGH (ref 1.7–7.7)
Neutrophils Relative %: 78 %
Platelets: 152 10*3/uL (ref 150–400)
RBC: 3.6 MIL/uL — ABNORMAL LOW (ref 3.87–5.11)
RDW: 16.1 % — ABNORMAL HIGH (ref 11.5–15.5)
WBC: 17.5 10*3/uL — ABNORMAL HIGH (ref 4.0–10.5)

## 2018-01-27 LAB — BASIC METABOLIC PANEL
Anion gap: 10 (ref 5–15)
BUN: 20 mg/dL (ref 6–20)
CO2: 27 mmol/L (ref 22–32)
Calcium: 9.6 mg/dL (ref 8.9–10.3)
Chloride: 98 mmol/L — ABNORMAL LOW (ref 101–111)
Creatinine, Ser: 1.53 mg/dL — ABNORMAL HIGH (ref 0.44–1.00)
GFR calc Af Amer: 55 mL/min — ABNORMAL LOW (ref >60)
GFR calc non Af Amer: 47 mL/min — ABNORMAL LOW (ref >60)
Glucose, Bld: 111 mg/dL — ABNORMAL HIGH (ref 65–99)
Potassium: 3.5 mmol/L (ref 3.5–5.1)
Sodium: 135 mmol/L (ref 135–145)

## 2018-01-27 MED ORDER — HYOSCYAMINE SULFATE 0.125 MG SL SUBL
0.2500 mg | SUBLINGUAL_TABLET | Freq: Once | SUBLINGUAL | Status: AC
  Administered 2018-01-27: 0.25 mg via SUBLINGUAL
  Filled 2018-01-27 (×2): qty 2

## 2018-01-27 MED ORDER — HYDROMORPHONE HCL 1 MG/ML IJ SOLN: 0.5000 mg | Freq: Four times a day (QID) | INTRAMUSCULAR | Status: DC | PRN

## 2018-01-27 MED ORDER — HYDROMORPHONE HCL 1 MG/ML IJ SOLN
0.5000 mg | Freq: Once | INTRAMUSCULAR | Status: AC
  Administered 2018-01-27: 0.5 mg via INTRAVENOUS
  Filled 2018-01-27: qty 0.5

## 2018-01-27 MED ORDER — HYDROMORPHONE HCL 1 MG/ML IJ SOLN
0.5000 mg | INTRAMUSCULAR | Status: DC | PRN
  Administered 2018-01-27 (×2): 0.5 mg via INTRAVENOUS
  Filled 2018-01-27 (×2): qty 0.5

## 2018-01-27 NOTE — Progress Notes (Signed)
PROGRESS NOTE  NAZLI PENN GYJ:[id number] DOB: Oct 24, 1995 DOA: 01/24/2018 PCP: Tonette Bihari, MD  HPI/Recap of past 24 hours: Patient is a 22 year old female with medical history significant for mixed connective tissue disease/discoid lupus being followed at [facility], hypertension presents to the ER with complaints of profuse bleeding from her tongue ulcer for the past 2 days.  Patient was evaluated by her rheumatologist recently, reported tongue ulcers likely from lupus.  Currently, with no bleeding.  ENT was consulted, recommended conservative measures by applying pressure.  Plan was to transfer patient to [facility], but has been declined by accepting physician at [facility], as patient is currently stable and can be followed up as an outpatient with rheumatology.  Today, patient still complained of generalized abdominal pain, no diarrhea, nausea/vomiting, no fever/chills noted. Discussed with patient, the need to see a nephrologist.  Assessment/Plan: Principal Problem:   Tongue ulceration Active Problems:   History of connective tissue disease   Hypertensive urgency   Enteritis  Bleeding tongue ulcer Currently bleeding has stopped Tongue ulcer likely due to SLE ENT consulted, noted to have a new hypervascular lesion on the anterior tongue consistent with vasculitis.  Recommend supportive care, local pressure.  No surgical intervention recommended Monitor closely  Chronic abdominal pain Afebrile, with leukocytosis (on steroid) ??  Enteritis due to lupus CT abdomen shows wall thickening in the distal transverse duodenum suggesting nonspecific inflammation Continue Protonix, steroids, pain meds ??  Pain medication seeking behavior (requesting specifically Dilaudid)  AKI Worsening creatinine for the past month ??  Progression to CKD (from lupus/uncontrolled hypertension) Nephrology consulted Status post IV fluids Daily BMP  Hypertensive crisis BP with better  control Status post nicardipine drip Continue home meds, IV hydralazine as needed Monitor closely  UTI Urine culture grew Streptococcus agalactiae Continue p.o. Amoxicillin  Normocytic anemia likely due to acute blood loss Stable Daily CBC  Mixed connective tissue disease/discoid lupus Follows with rheumatologist at [facility], Dr Gerilyn Nestle Rheumatology fellow Dr. Rubie Maid declined transfer to [facility] on 01/25/18 as patient can be followed as an outpatient Spoke to Dr Gerilyn Nestle on 01/27/18: No further recs, d/c on prednisone and will schedule an appointment within a week. She noted some non-compliance issues with pt as regards to being compliant with prednisone (takes smaller doses than prescribed) Continue IV Solu-Medrol, upon discharge should be on 60 mg of prednisone a day for 1 week, followed by 40 mg of Pred for 1 week followed by 20 mg of Pred for 2 weeks.  Discussions about starting Benlysta 200 mg SQ weekly by rheumatologist, will be done only as an outpt     Code Status: Full  Family Communication: None at bedside  Disposition Plan: Likely 01/28/18   Consultants:  Nephrology consulted  Procedures:  None  Antimicrobials: Amoxicillin for UTI  DVT prophylaxis: SCDs   Objective: Vitals:   01/26/18 2040 01/27/18 0555 01/27/18 0921 01/27/18 1407  BP: (!) 127/94 (!) 136/102 (!) 144/116 124/84  Pulse: 60 (!) 56 (!) 59 (!) 57  Resp: 20 16  16   Temp: 98.1 F (36.7 C) 97.6 F (36.4 C)  98.3 F (36.8 C)  TempSrc: Oral Oral  Oral  SpO2: 95% 100%  100%  Weight:      Height:        Intake/Output Summary (Last 24 hours) at 01/27/2018 2015 Last data filed at 01/27/2018 1837 Gross per 24 hour  Intake 120 ml  Output -  Net 120 ml   [employer]  01/24/18 1541  Weight: 84 kg (185 lb 3 oz)    Exam:   General: NAD  Cardiovascular: S1, S2 present  Respiratory: CTA B  Abdomen: Soft, nontender, nondistended, bowel sounds present  Musculoskeletal: No  pedal edema bilaterally  Skin: Noted old scars on bilateral lower extremities, likely from discoid lupus  Psychiatry: Normal mood   Data Reviewed: CBC: Recent Labs  Lab 01/24/18 0630  01/24/18 2158 01/25/18 0535 01/25/18 1034 01/26/18 0523 01/27/18 0545  WBC 11.8*   < > 9.3 10.3 12.5* 10.4 17.5*  NEUTROABS 8.1*  --   --   --   --  9.1* 13.6*  HGB 12.9   < > 13.3 10.4* 11.4* 11.0* 11.1*  HCT 38.5   < > 39.3 32.1* 35.3* 34.8* 35.3*  MCV 94.1   < > 95.2 95.8 97.2 98.3 98.1  PLT 183   < > 143* 134* 137* 128* 152   < > = values in this interval not displayed.   Basic Metabolic Panel: Recent Labs  Lab 01/24/18 0903 01/24/18 1327 01/25/18 0534 01/26/18 0523 01/27/18 0545  NA 133* 134* 136 133* 135  K 3.8 2.8* 3.5 3.5 3.5  CL 99* 97* 100* 97* 98*  CO2 18* 24 25 24 27   GLUCOSE 73 89 112* 122* 111*  BUN 20 18 16 15 20   CREATININE 1.48* 1.45* 1.37* 1.35* 1.53*  CALCIUM 7.9* 8.6* 9.2 9.3 9.6  MG 1.5*  --   --   --   --    GFR: Estimated Creatinine Clearance: 63 mL/min (A) (by C-G formula based on SCr of 1.53 mg/dL (H)). Liver Function Tests: Recent Labs  Lab 01/24/18 0623 01/25/18 0534  AST 18 14*  ALT 15 13*  ALKPHOS 60 48  BILITOT 1.5* 0.9  PROT 7.7 6.6  ALBUMIN 3.9 3.3*   Recent Labs  Lab 01/24/18 0623  LIPASE 28   No results for input(s): AMMONIA in the last 168 hours. Coagulation Profile: Recent Labs  Lab 01/25/18 0534  INR 1.01   Cardiac Enzymes: No results for input(s): CKTOTAL, CKMB, CKMBINDEX, TROPONINI in the last 168 hours. BNP (last 3 results) No results for input(s): PROBNP in the last 8760 hours. HbA1C: No results for input(s): HGBA1C in the last 72 hours. CBG: No results for input(s): GLUCAP in the last 168 hours. Lipid Profile: No results for input(s): CHOL, HDL, LDLCALC, TRIG, CHOLHDL, LDLDIRECT in the last 72 hours. Thyroid Function Tests: No results for input(s): TSH, T4TOTAL, FREET4, T3FREE, THYROIDAB in the last 72  hours. Anemia Panel: No results for input(s): VITAMINB12, FOLATE, FERRITIN, TIBC, IRON, RETICCTPCT in the last 72 hours. Urine analysis:    Component Value Date/Time   COLORURINE YELLOW 01/24/2018 [place] 01/24/2018 1027   LABSPEC 1.021 01/24/2018 1027   PHURINE 6.0 01/24/2018 1027   GLUCOSEU NEGATIVE 01/24/2018 1027   HGBUR LARGE (A) 01/24/2018 1027   BILIRUBINUR NEGATIVE 01/24/2018 1027   KETONESUR 5 (A) 01/24/2018 1027   PROTEINUR 100 (A) 01/24/2018 1027   UROBILINOGEN 1.0 03/30/2015 2328   NITRITE NEGATIVE 01/24/2018 1027   LEUKOCYTESUR MODERATE (A) 01/24/2018 1027   Sepsis Labs: @LABRCNTIP (procalcitonin:4,lacticidven:4)  ) Recent Results (from the past 240 hour(s))  Urine culture     Status: Abnormal   Collection Time: 01/24/18 10:27 AM  Result Value Ref Range Status   Specimen Description   Final    URINE, RANDOM Performed at [facility], [place] [address], [place], [place] [address]  Special Requests   Final    NONE Performed at [facility], [place] [address] [place], [place] [address]    Culture (A)  Final    60,000 COLONIES/mL GROUP B STREP(S.AGALACTIAE)ISOLATED TESTING AGAINST S. AGALACTIAE NOT ROUTINELY PERFORMED DUE TO PREDICTABILITY OF AMP/PEN/VAN SUSCEPTIBILITY. Performed at [facility], [place] [address], [place], [place] [address]    Report Status 01/25/2018 FINAL  Final      Studies: No results found.  Scheduled Meds: . amLODipine  10 mg Oral Daily  . amoxicillin  500 mg Oral Q12H  . carvedilol  10 mg Oral Daily  . chlorthalidone  50 mg Oral Daily  . cloNIDine  0.1 mg Oral TID  . magic mouthwash w/lidocaine  5 mL Oral QID  . methylPREDNISolone (SOLU-MEDROL) injection  60 mg Intravenous Daily  . pantoprazole  40 mg Oral BID    Continuous Infusions:    LOS: 3 days     Alma Friendly, MD [facility]   If 7PM-7AM, please contact  night-coverage [email or web address] Password [email or web address] 01/27/2018, 8:15 PM

## 2018-01-27 NOTE — Progress Notes (Signed)
Patient c/o pain rated at 10/10. The abdomen is still aching with sharp pain. Patient asked for more pain medication.  Last pain meds were administered @ 1945. PCP was notified.

## 2018-01-28 DIAGNOSIS — N182 Chronic kidney disease, stage 2 (mild): Secondary | ICD-10-CM

## 2018-01-28 LAB — CBC WITH DIFFERENTIAL/PLATELET
Basophils Absolute: 0 10*3/uL (ref 0.0–0.1)
Basophils Relative: 0 %
Eosinophils Absolute: 0 10*3/uL (ref 0.0–0.7)
Eosinophils Relative: 0 %
HCT: 39.6 % (ref 36.0–46.0)
Hemoglobin: 12.8 g/dL (ref 12.0–15.0)
Lymphocytes Relative: 16 %
Lymphs Abs: 2.7 10*3/uL (ref 0.7–4.0)
MCH: 31.8 pg (ref 26.0–34.0)
MCHC: 32.3 g/dL (ref 30.0–36.0)
MCV: 98.3 fL (ref 78.0–100.0)
Monocytes Absolute: 2.1 10*3/uL — ABNORMAL HIGH (ref 0.1–1.0)
Monocytes Relative: 12 %
Neutro Abs: 12.6 10*3/uL — ABNORMAL HIGH (ref 1.7–7.7)
Neutrophils Relative %: 72 %
Platelets: 137 10*3/uL — ABNORMAL LOW (ref 150–400)
RBC: 4.03 MIL/uL (ref 3.87–5.11)
RDW: 15.9 % — ABNORMAL HIGH (ref 11.5–15.5)
WBC: 17.4 10*3/uL — ABNORMAL HIGH (ref 4.0–10.5)

## 2018-01-28 LAB — BASIC METABOLIC PANEL
Anion gap: 12 (ref 5–15)
BUN: 22 mg/dL — ABNORMAL HIGH (ref 6–20)
CO2: 29 mmol/L (ref 22–32)
Calcium: 9.8 mg/dL (ref 8.9–10.3)
Chloride: 96 mmol/L — ABNORMAL LOW (ref 101–111)
Creatinine, Ser: 1.57 mg/dL — ABNORMAL HIGH (ref 0.44–1.00)
GFR calc Af Amer: 53 mL/min — ABNORMAL LOW (ref >60)
GFR calc non Af Amer: 46 mL/min — ABNORMAL LOW (ref >60)
Glucose, Bld: 102 mg/dL — ABNORMAL HIGH (ref 65–99)
Potassium: 3.6 mmol/L (ref 3.5–5.1)
Sodium: 137 mmol/L (ref 135–145)

## 2018-01-28 MED ORDER — PANTOPRAZOLE SODIUM 40 MG PO TBEC: 40.0000 mg | DELAYED_RELEASE_TABLET | Freq: Two times a day (BID) | ORAL | 0 refills | Status: DC

## 2018-01-28 MED ORDER — PREDNISONE 20 MG PO TABS: ORAL_TABLET | ORAL | 0 refills | Status: DC

## 2018-01-28 MED ORDER — HYDROMORPHONE HCL 1 MG/ML IJ SOLN
0.5000 mg | INTRAMUSCULAR | Status: AC | PRN
  Administered 2018-01-28 (×2): 0.5 mg via INTRAVENOUS
  Filled 2018-01-28 (×2): qty 0.5

## 2018-01-28 MED ORDER — OXYCODONE-ACETAMINOPHEN 5-325 MG PO TABS: 1.0000 | ORAL_TABLET | Freq: Four times a day (QID) | ORAL | 0 refills | Status: AC | PRN

## 2018-01-28 MED ORDER — CHLORTHALIDONE 50 MG PO TABS: 50.0000 mg | ORAL_TABLET | Freq: Every day | ORAL | 0 refills | Status: DC

## 2018-01-28 MED ORDER — AMLODIPINE BESYLATE 10 MG PO TABS: 10.0000 mg | ORAL_TABLET | Freq: Every day | ORAL | 0 refills | Status: DC

## 2018-01-28 MED ORDER — CARVEDILOL PHOSPHATE ER 10 MG PO CP24: 10.0000 mg | ORAL_CAPSULE | Freq: Every day | ORAL | 0 refills | Status: DC

## 2018-01-28 MED ORDER — CLONIDINE HCL 0.1 MG PO TABS: 0.1000 mg | ORAL_TABLET | Freq: Three times a day (TID) | ORAL | 0 refills | Status: DC

## 2018-01-28 MED ORDER — OXYCODONE-ACETAMINOPHEN 5-325 MG PO TABS
1.0000 | ORAL_TABLET | Freq: Four times a day (QID) | ORAL | Status: DC | PRN
  Administered 2018-01-28: 1 via ORAL
  Filled 2018-01-28: qty 1

## 2018-01-28 NOTE — Discharge Instructions (Signed)
Pantoprazole tablets What is this medicine? PANTOPRAZOLE (pan TOE pra zole) prevents the production of acid in the stomach. It is used to treat gastroesophageal reflux disease (GERD), inflammation of the esophagus, and Zollinger-Ellison syndrome. This medicine may be used for other purposes; ask your health care provider or pharmacist if you have questions. COMMON BRAND NAME(S): Protonix What should I tell my health care provider before I take this medicine? They need to know if you have any of these conditions: -liver disease -low levels of magnesium in the blood -lupus -an unusual or allergic reaction to omeprazole, lansoprazole, pantoprazole, rabeprazole, other medicines, foods, dyes, or preservatives -pregnant or trying to get pregnant -breast-feeding How should I use this medicine? Take this medicine by mouth. Swallow the tablets whole with a drink of water. Follow the directions on the prescription label. Do not crush, break, or chew. Take your medicine at regular intervals. Do not take your medicine more often than directed. Talk to your pediatrician regarding the use of this medicine in children. While this drug may be prescribed for children as young as 5 years for selected conditions, precautions do apply. Overdosage: If you think you have taken too much of this medicine contact a poison control center or emergency room at once. NOTE: This medicine is only for you. Do not share this medicine with others. What if I miss a dose? If you miss a dose, take it as soon as you can. If it is almost time for your next dose, take only that dose. Do not take double or extra doses. What may interact with this medicine? Do not take this medicine with any of the following medications: -atazanavir -nelfinavir This medicine may also interact with the following medications: -ampicillin -delavirdine -erlotinib -iron salts -medicines for fungal infections like ketoconazole, itraconazole and  voriconazole -methotrexate -mycophenolate mofetil -warfarin This list may not describe all possible interactions. Give your health care provider a list of all the medicines, herbs, non-prescription drugs, or dietary supplements you use. Also tell them if you smoke, drink alcohol, or use illegal drugs. Some items may interact with your medicine. What should I watch for while using this medicine? It can take several days before your stomach pain gets better. Check with your doctor or health care professional if your condition does not start to get better, or if it gets worse. You may need blood work done while you are taking this medicine. What side effects may I notice from receiving this medicine? Side effects that you should report to your doctor or health care professional as soon as possible: -allergic reactions like skin rash, itching or hives, swelling of the face, lips, or tongue -bone, muscle or joint pain -breathing problems -chest pain or chest tightness -dark yellow or brown urine -dizziness -fast, irregular heartbeat -feeling faint or lightheaded -fever or sore throat -muscle spasm -palpitations -rash on cheeks or arms that gets worse in the sun -redness, blistering, peeling or loosening of the skin, including inside the mouth -seizures -tremors -unusual bleeding or bruising -unusually weak or tired -yellowing of the eyes or skin Side effects that usually do not require medical attention (report to your doctor or health care professional if they continue or are bothersome): -constipation -diarrhea -dry mouth -headache -nausea This list may not describe all possible side effects. Call your doctor for medical advice about side effects. You may report side effects to FDA at 1-800-FDA-1088. Where should I keep my medicine? Keep out of the reach of children. Store at room  temperature between 15 and 30 degrees C (59 and 86 degrees F). Protect from light and moisture. Throw  away any unused medicine after the expiration date. NOTE: This sheet is a summary. It may not cover all possible information. If you have questions about this medicine, talk to your doctor, pharmacist, or health care provider.  2018 Elsevier/Gold Standard (2015-09-19 12:20:19) Acetaminophen; Oxycodone tablets What is this medicine? ACETAMINOPHEN; OXYCODONE (a set a MEE noe fen; ox i KOE done) is a pain reliever. It is used to treat moderate to severe pain. This medicine may be used for other purposes; ask your health care provider or pharmacist if you have questions. COMMON BRAND NAME(S): Endocet, Magnacet, Narvox, Percocet, Perloxx, Primalev, Primlev, Roxicet, Xolox What should I tell my health care provider before I take this medicine? They need to know if you have any of these conditions: -brain tumor -Crohn's disease, inflammatory bowel disease, or ulcerative colitis -drug abuse or addiction -head injury -heart or circulation problems -if you often drink alcohol -kidney disease or problems going to the bathroom -liver disease -lung disease, asthma, or breathing problems -an unusual or allergic reaction to acetaminophen, oxycodone, other opioid analgesics, other medicines, foods, dyes, or preservatives -pregnant or trying to get pregnant -breast-feeding How should I use this medicine? Take this medicine by mouth with a full glass of water. Follow the directions on the prescription label. You can take it with or without food. If it upsets your stomach, take it with food. Take your medicine at regular intervals. Do not take it more often than directed. A special MedGuide will be given to you by the pharmacist with each prescription and refill. Be sure to read this information carefully each time. Talk to your pediatrician regarding the use of this medicine in children. Special care may be needed. Overdosage: If you think you have taken too much of this medicine contact a poison control  center or emergency room at once. NOTE: This medicine is only for you. Do not share this medicine with others. What if I miss a dose? If you miss a dose, take it as soon as you can. If it is almost time for your next dose, take only that dose. Do not take double or extra doses. What may interact with this medicine? This medicine may interact with the following medications: -alcohol -antihistamines for allergy, cough and cold -antiviral medicines for HIV or AIDS -atropine -certain antibiotics like clarithromycin, erythromycin, linezolid, rifampin -certain medicines for anxiety or sleep -certain medicines for bladder problems like oxybutynin, tolterodine -certain medicines for depression like amitriptyline, fluoxetine, sertraline -certain medicines for fungal infections like ketoconazole, itraconazole, voriconazole -certain medicines for migraine headache like almotriptan, eletriptan, frovatriptan, naratriptan, rizatriptan, sumatriptan, zolmitriptan -certain medicines for nausea or vomiting like dolasetron, ondansetron, palonosetron -certain medicines for Parkinson's disease like benztropine, trihexyphenidyl -certain medicines for seizures like phenobarbital, phenytoin, primidone -certain medicines for stomach problems like dicyclomine, hyoscyamine -certain medicines for travel sickness like scopolamine -diuretics -general anesthetics like halothane, isoflurane, methoxyflurane, propofol -ipratropium -local anesthetics like lidocaine, pramoxine, tetracaine -MAOIs like Carbex, Eldepryl, Marplan, Nardil, and Parnate -medicines that relax muscles for surgery -methylene blue -nilotinib -other medicines with acetaminophen -other narcotic medicines for pain or cough -phenothiazines like chlorpromazine, mesoridazine, prochlorperazine, thioridazine This list may not describe all possible interactions. Give your health care provider a list of all the medicines, herbs, non-prescription drugs, or  dietary supplements you use. Also tell them if you smoke, drink alcohol, or use illegal drugs. Some items may interact with  your medicine. What should I watch for while using this medicine? Tell your doctor or health care professional if your pain does not go away, if it gets worse, or if you have new or a different type of pain. You may develop tolerance to the medicine. Tolerance means that you will need a higher dose of the medication for pain relief. Tolerance is normal and is expected if you take this medicine for a long time. Do not suddenly stop taking your medicine because you may develop a severe reaction. Your body becomes used to the medicine. This does NOT mean you are addicted. Addiction is a behavior related to getting and using a drug for a non-medical reason. If you have pain, you have a medical reason to take pain medicine. Your doctor will tell you how much medicine to take. If your doctor wants you to stop the medicine, the dose will be slowly lowered over time to avoid any side effects. There are different types of narcotic medicines (opiates). If you take more than one type at the same time or if you are taking another medicine that also causes drowsiness, you may have more side effects. Give your health care provider a list of all medicines you use. Your doctor will tell you how much medicine to take. Do not take more medicine than directed. Call emergency for help if you have problems breathing or unusual sleepiness. Do not take other medicines that contain acetaminophen with this medicine. Always read labels carefully. If you have questions, ask your doctor or pharmacist. If you take too much acetaminophen get medical help right away. Too much acetaminophen can be very dangerous and cause liver damage. Even if you do not have symptoms, it is important to get help right away. You may get drowsy or dizzy. Do not drive, use machinery, or do anything that needs mental alertness until you  know how this medicine affects you. Do not stand or sit up quickly, especially if you are an older patient. This reduces the risk of dizzy or fainting spells. Alcohol may interfere with the effect of this medicine. Avoid alcoholic drinks. The medicine will cause constipation. Try to have a bowel movement at least every 2 to 3 days. If you do not have a bowel movement for 3 days, call your doctor or health care professional. Your mouth may get dry. Chewing sugarless gum or sucking hard candy, and drinking plenty or water may help. Contact your doctor if the problem does not go away or is severe. What side effects may I notice from receiving this medicine? Side effects that you should report to your doctor or health care professional as soon as possible: -allergic reactions like skin rash, itching or hives, swelling of the face, lips, or tongue -breathing problems -confusion -redness, blistering, peeling or loosening of the skin, including inside the mouth -signs and symptoms of liver injury like dark yellow or brown urine; general ill feeling or flu-like symptoms; light-colored stools; loss of appetite; nausea; right upper belly pain; unusually weak or tired; yellowing of the eyes or skin -signs and symptoms of low blood pressure like dizziness; feeling faint or lightheaded, falls; unusually weak or tired -trouble passing urine or change in the amount of urine Side effects that usually do not require medical attention (report to your doctor or health care professional if they continue or are bothersome): -constipation -dry mouth -nausea, vomiting -tiredness This list may not describe all possible side effects. Call your doctor for medical advice  about side effects. You may report side effects to FDA at 1-800-FDA-1088. Where should I keep my medicine? Keep out of the reach of children. This medicine can be abused. Keep your medicine in a safe place to protect it from theft. Do not share this  medicine with anyone. Selling or giving away this medicine is dangerous and against the law. This medicine may cause accidental overdose and death if it taken by other adults, children, or pets. Mix any unused medicine with a substance like cat litter or coffee grounds. Then throw the medicine away in a sealed container like a sealed bag or a coffee can with a lid. Do not use the medicine after the expiration date. Store at room temperature between 20 and 25 degrees C (68 and 77 degrees F). NOTE: This sheet is a summary. It may not cover all possible information. If you have questions about this medicine, talk to your doctor, pharmacist, or health care provider.  2018 Elsevier/Gold Standard (2015-05-13 21:48:12) Clonidine tablets What is this medicine? CLONIDINE (KLOE ni deen) is used to treat high blood pressure. This medicine may be used for other purposes; ask your health care provider or pharmacist if you have questions. COMMON BRAND NAME(S): Catapres What should I tell my health care provider before I take this medicine? They need to know if you have any of these conditions: -kidney disease -an unusual or allergic reaction to clonidine, other medicines, foods, dyes, or preservatives -pregnant or trying to get pregnant -breast-feeding How should I use this medicine? Take this medicine by mouth with a glass of water. Follow the directions on the prescription label. Take your doses at regular intervals. Do not take your medicine more often than directed. Do not suddenly stop taking this medicine. You must gradually reduce the dose or you may get a dangerous increase in blood pressure. Ask your doctor or health care professional for advice. Talk to your pediatrician regarding the use of this medicine in children. Special care may be needed. Overdosage: If you think you have taken too much of this medicine contact a poison control center or emergency room at once. NOTE: This medicine is only for  you. Do not share this medicine with others. What if I miss a dose? If you miss a dose, take it as soon as you can. If it is almost time for your next dose, take only that dose. Do not take double or extra doses. What may interact with this medicine? Do not take this medicine with any of the following medications: -MAOIs like Carbex, Eldepryl, Marplan, Nardil, and Parnate This medicine may also interact with the following medications: -barbiturate medicines for inducing sleep or treating seizures like phenobarbital -certain medicines for blood pressure, heart disease, irregular heart beat -certain medicines for depression, anxiety, or psychotic disturbances -prescription pain medicines This list may not describe all possible interactions. Give your health care provider a list of all the medicines, herbs, non-prescription drugs, or dietary supplements you use. Also tell them if you smoke, drink alcohol, or use illegal drugs. Some items may interact with your medicine. What should I watch for while using this medicine? Visit your doctor or health care professional for regular checks on your progress. Check your heart rate and blood pressure regularly while you are taking this medicine. Ask your doctor or health care professional what your heart rate should be and when you should contact him or her. You may get drowsy or dizzy. Do not drive, use machinery, or do  anything that needs mental alertness until you know how this medicine affects you. To avoid dizzy or fainting spells, do not stand or sit up quickly, especially if you are an older person. Alcohol can make you more drowsy and dizzy. Avoid alcoholic drinks. Your mouth may get dry. Chewing sugarless gum or sucking hard candy, and drinking plenty of water will help. Do not treat yourself for coughs, colds, or pain while you are taking this medicine without asking your doctor or health care professional for advice. Some ingredients may increase your  blood pressure. If you are going to have surgery tell your doctor or health care professional that you are taking this medicine. What side effects may I notice from receiving this medicine? Side effects that you should report to your doctor or health care professional as soon as possible: -allergic reactions like skin rash, itching or hives, swelling of the face, lips, or tongue -anxiety, nervousness -chest pain -depression -fast, irregular heartbeat -swelling of feet or legs -unusually weak or tired Side effects that usually do not require medical attention (report to your doctor or health care professional if they continue or are bothersome): -change in sex drive or performance -constipation -headache This list may not describe all possible side effects. Call your doctor for medical advice about side effects. You may report side effects to FDA at 1-800-FDA-1088. Where should I keep my medicine? Keep out of the reach of children. Store at room temperature between 15 and 30 degrees C (59 and 86 degrees F). Protect from light. Keep container tightly closed. Throw away any unused medicine after the expiration date. NOTE: This sheet is a summary. It may not cover all possible information. If you have questions about this medicine, talk to your doctor, pharmacist, or health care provider.  2018 [employer]/Gold Standard (2011-02-11 13:01:28) Carvedilol tablets What is this medicine? CARVEDILOL (KAR ve dil ol) is a beta-blocker. Beta-blockers reduce the workload on the heart and help it to beat more regularly. This medicine is used to treat high blood pressure and heart failure. This medicine may be used for other purposes; ask your health care provider or pharmacist if you have questions. COMMON BRAND NAME(S): Coreg What should I tell my health care provider before I take this medicine? They need to know if you have any of these conditions: -circulation problems -diabetes -history of heart  attack or heart disease -liver disease -lung or breathing disease, like asthma or emphysema -pheochromocytoma -slow or irregular heartbeat -thyroid disease -an unusual or allergic reaction to carvedilol, other beta-blockers, medicines, foods, dyes, or preservatives -pregnant or trying to get pregnant -breast-feeding How should I use this medicine? Take this medicine by mouth with a glass of water. Follow the directions on the prescription label. It is best to take the tablets with food. Take your doses at regular intervals. Do not take your medicine more often than directed. Do not stop taking except on the advice of your doctor or health care professional. Talk to your pediatrician regarding the use of this medicine in children. Special care may be needed. Overdosage: If you think you have taken too much of this medicine contact a poison control center or emergency room at once. NOTE: This medicine is only for you. Do not share this medicine with others. What if I miss a dose? If you miss a dose, take it as soon as you can. If it is almost time for your next dose, take only that dose. Do not take double or extra  doses. What may interact with this medicine? This medicine may interact with the following medications: -certain medicines for blood pressure, heart disease, irregular heart beat -certain medicines for depression, like fluoxetine or paroxetine -certain medicines for diabetes, like glipizide or glyburide -cimetidine -clonidine -cyclosporine -digoxin -MAOIs like Carbex, Eldepryl, Marplan, Nardil, and Parnate -reserpine -rifampin This list may not describe all possible interactions. Give your health care provider a list of all the medicines, herbs, non-prescription drugs, or dietary supplements you use. Also tell them if you smoke, drink alcohol, or use illegal drugs. Some items may interact with your medicine. What should I watch for while using this medicine? Check your heart  rate and blood pressure regularly while you are taking this medicine. Ask your doctor or health care professional what your heart rate and blood pressure should be, and when you should contact him or her. Do not stop taking this medicine suddenly. This could lead to serious heart-related effects. Contact your doctor or health care professional if you have difficulty breathing while taking this drug. Check your weight daily. Ask your doctor or health care professional when you should notify him/her of any weight gain. You may get drowsy or dizzy. Do not drive, use machinery, or do anything that requires mental alertness until you know how this medicine affects you. To reduce the risk of dizzy or fainting spells, do not sit or stand up quickly. Alcohol can make you more drowsy, and increase flushing and rapid heartbeats. Avoid alcoholic drinks. If you have diabetes, check your blood sugar as directed. Tell your doctor if you have changes in your blood sugar while you are taking this medicine. If you are going to have surgery, tell your doctor or health care professional that you are taking this medicine. What side effects may I notice from receiving this medicine? Side effects that you should report to your doctor or health care professional as soon as possible: -allergic reactions like skin rash, itching or hives, swelling of the face, lips, or tongue -breathing problems -dark urine -irregular heartbeat -swollen legs or ankles -vomiting -yellowing of the eyes or skin Side effects that usually do not require medical attention (report to your doctor or health care professional if they continue or are bothersome): -change in sex drive or performance -diarrhea -dry eyes (especially if wearing contact lenses) -dry, itching skin -headache -nausea -unusually tired This list may not describe all possible side effects. Call your doctor for medical advice about side effects. You may report side effects  to FDA at 1-800-FDA-1088. Where should I keep my medicine? Keep out of the reach of children. Store at room temperature below 30 degrees C (86 degrees F). Protect from moisture. Keep container tightly closed. Throw away any unused medicine after the expiration date. NOTE: This sheet is a summary. It may not cover all possible information. If you have questions about this medicine, talk to your doctor, pharmacist, or health care provider.  2018 [employer]/Gold Standard (2013-04-23 14:12:02) Amlodipine tablets What is this medicine? AMLODIPINE (am LOE di peen) is a calcium-channel blocker. It affects the amount of calcium found in your heart and muscle cells. This relaxes your blood vessels, which can reduce the amount of work the heart has to do. This medicine is used to lower high blood pressure. It is also used to prevent chest pain. This medicine may be used for other purposes; ask your health care provider or pharmacist if you have questions. COMMON BRAND NAME(S): Norvasc What should I tell my health  care provider before I take this medicine? They need to know if you have any of these conditions: -heart problems like heart failure or aortic stenosis -liver disease -an unusual or allergic reaction to amlodipine, other medicines, foods, dyes, or preservatives -pregnant or trying to get pregnant -breast-feeding How should I use this medicine? Take this medicine by mouth with a glass of water. Follow the directions on the prescription label. Take your medicine at regular intervals. Do not take more medicine than directed. Talk to your pediatrician regarding the use of this medicine in children. Special care may be needed. This medicine has been used in children as young as 6. Persons over 74 years old may have a stronger reaction to this medicine and need smaller doses. Overdosage: If you think you have taken too much of this medicine contact a poison control center or emergency room at  once. NOTE: This medicine is only for you. Do not share this medicine with others. What if I miss a dose? If you miss a dose, take it as soon as you can. If it is almost time for your next dose, take only that dose. Do not take double or extra doses. What may interact with this medicine? -herbal or dietary supplements -local or general anesthetics -medicines for high blood pressure -medicines for prostate problems -rifampin This list may not describe all possible interactions. Give your health care provider a list of all the medicines, herbs, non-prescription drugs, or dietary supplements you use. Also tell them if you smoke, drink alcohol, or use illegal drugs. Some items may interact with your medicine. What should I watch for while using this medicine? Visit your doctor or health care professional for regular check ups. Check your blood pressure and pulse rate regularly. Ask your health care professional what your blood pressure and pulse rate should be, and when you should contact him or her. This medicine may make you feel confused, dizzy or lightheaded. Do not drive, use machinery, or do anything that needs mental alertness until you know how this medicine affects you. To reduce the risk of dizzy or fainting spells, do not sit or stand up quickly, especially if you are an older patient. Avoid alcoholic drinks; they can make you more dizzy. Do not suddenly stop taking amlodipine. Ask your doctor or health care professional how you can gradually reduce the dose. What side effects may I notice from receiving this medicine? Side effects that you should report to your doctor or health care professional as soon as possible: -allergic reactions like skin rash, itching or hives, swelling of the face, lips, or tongue -breathing problems -changes in vision or hearing -chest pain -fast, irregular heartbeat -swelling of legs or ankles Side effects that usually do not require medical attention  (report to your doctor or health care professional if they continue or are bothersome): -dry mouth -facial flushing -nausea, vomiting -stomach gas, pain -tired, weak -trouble sleeping This list may not describe all possible side effects. Call your doctor for medical advice about side effects. You may report side effects to FDA at 1-800-FDA-1088. Where should I keep my medicine? Keep out of the reach of children. Store at room temperature between 59 and 86 degrees F (15 and 30 degrees C). Protect from light. Keep container tightly closed. Throw away any unused medicine after the expiration date. NOTE: This sheet is a summary. It may not cover all possible information. If you have questions about this medicine, talk to your doctor, pharmacist, or health  care provider.  2018 Elsevier/Gold Standard (2012-07-15 11:40:58)

## 2018-01-28 NOTE — Discharge Summary (Signed)
Discharge Summary  Linda [name] VEH:[id number] DOB: [birth year]  PCP: Tonette Bihari, MD  Admit date: 01/24/2018 Discharge date: 01/28/2018  Time spent: 45 mins   Recommendations for Outpatient Follow-up:  1. PCP 2. Nephrology 3. Rheumatology   Discharge Diagnoses:  Active Hospital Problems   Diagnosis Date Noted  . Tongue ulceration 01/24/2018  . Enteritis 11/29/2017  . Hypertensive urgency 11/29/2017  . History of connective tissue disease     Resolved Hospital Problems  No resolved problems to display.    Discharge Condition: Stable  Diet recommendation: Renal diet, heart healthy  Vitals:   01/28/18 1350 01/28/18 1350  BP:  120/83  Pulse:  (!) 51  Resp:  16  Temp: 97.6 F (36.4 C)   SpO2:  100%    History of present illness:  Patient is a 22 year old female with medical history significant for mixed connective tissue disease/discoid lupus being followed at [facility], hypertension presents to the ER with complaints of profuse bleeding from her tongue ulcer for the past 2 days.  Patient was evaluated by her rheumatologist recently, reported tongue ulcers likely from lupus.  Currently, with no bleeding.  ENT was consulted, recommended conservative measures by applying pressure.  Plan was to transfer patient to [facility], but has been declined by accepting physician at [facility], as patient is currently stable and can be followed up as an outpatient with rheumatology.  Today, patient still complained of generalized abdominal pain, no diarrhea, nausea/vomiting, no fever/chills noted. Discussed with patient in depth overall about her health, needs to be compliant with her medications (steroids/BP meds), follow up appointments, pt verbalized understanding.   Hospital Course:  Principal Problem:   Tongue ulceration Active Problems:   History of connective tissue disease   Hypertensive urgency   Enteritis  Bleeding tongue ulcer Currently bleeding has  stopped Tongue ulcer likely due to SLE ENT consulted, noted to have a new hypervascular lesion on the anterior tongue consistent with vasculitis.  Recommend supportive care, local pressure.  No surgical intervention recommended Monitor closely  Chronic abdominal pain Afebrile, with leukocytosis (on steroid) ??  Enteritis due to lupus CT abdomen shows wall thickening in the distal transverse duodenum suggesting nonspecific inflammation Continue Protonix, steroids, pain meds ??  Pain medication seeking behavior (requesting specifically Dilaudid) Follow up with rheumatologist, d/c on PO percocet prn  AKI on CKD stage 2 CKD (from lupus/uncontrolled hypertension) Nephrology consulted, will follow up outpatient  Hypertensive crisis BP with better control Status post nicardipine drip Continue home meds (amlodipine, coreg, chlothalidone), started on clonidine  UTI Urine culture grew Streptococcus agalactiae Completed course with p.o. Amoxicillin  Normocytic anemia likely due to acute blood loss/CKD Stable  Mixed connective tissue disease/discoid lupus Follows with rheumatologist at [facility], Dr Gerilyn Nestle Rheumatology fellow Dr. Rubie Maid declined transfer to [facility] on 01/25/18 as patient can be followed as an outpatient Spoke to Dr Gerilyn Nestle on 01/27/18: No further recs, d/c on prednisone and will schedule an appointment within a week. She noted some non-compliance issues with pt as regards to prednisone (takes smaller doses than prescribed) Switch IV Solu-Med to 60 mg of prednisone a day for 1 week, followed by 40 mg of Pred till visit with rheumatologist Discussions about starting Benlysta 200 mg SQ weekly by rheumatologist, will be done only as an outpt     Procedures:  None  Consultations:  Nephrology  Discharge Exam: BP 120/83 (BP Location: Left Arm)   Pulse (!) 51   Temp 97.6 F (36.4  C) (Axillary)   Resp 16   Ht 5\' 6"  (1.676 m)   Wt 84 kg (185 lb 3 oz)    LMP 01/19/2018 (Approximate) Comment: negative HCG blood test 01-24-2018  SpO2 100%   BMI 29.89 kg/m   General: NAD Cardiovascular: S1, S2 present Respiratory: CTAB   Discharge Instructions You were cared for by a hospitalist during your hospital stay. If you have any questions about your discharge medications or the care you received while you were in the hospital after you are discharged, you can call the unit and asked to speak with the hospitalist on call if the hospitalist that took care of you is not available. Once you are discharged, your primary care physician will handle any further medical issues. Please note that NO REFILLS for any discharge medications will be authorized once you are discharged, as it is imperative that you return to your primary care physician (or establish a relationship with a primary care physician if you do not have one) for your aftercare needs so that they can reassess your need for medications and monitor your lab values.  Discharge Instructions    Diet - low sodium heart healthy   Complete by:  As directed    Increase activity slowly   Complete by:  As directed      Allergies as of 01/28/2018      Reactions   Lisinopril Anaphylaxis   Angioedema   Vicodin [hydrocodone-acetaminophen] Itching, Rash      Medication List    STOP taking these medications   fluconazole 100 MG tablet Commonly known as:  DIFLUCAN     TAKE these medications   amLODipine 10 MG tablet Commonly known as:  NORVASC Take 1 tablet (10 mg total) by mouth daily.   carvedilol 10 MG 24 hr capsule Commonly known as:  COREG CR Take 1 capsule (10 mg total) by mouth daily.   chlorthalidone 50 MG tablet Commonly known as:  HYGROTON Take 1 tablet (50 mg total) by mouth daily.   cloNIDine 0.1 MG tablet Commonly known as:  CATAPRES Take 1 tablet (0.1 mg total) by mouth 3 (three) times daily.   magic mouthwash w/lidocaine Soln Take 5 mLs by mouth 4 (four) times daily for 14  days. Swish solution in your mouth for 1-2 minutes and then spit out.   oxyCODONE-acetaminophen 5-325 MG tablet Commonly known as:  PERCOCET/ROXICET Take 1 tablet by mouth every 6 (six) hours as needed for up to 7 days for moderate pain or severe pain. What changed:    when to take this  reasons to take this   pantoprazole 40 MG tablet Commonly known as:  PROTONIX Take 1 tablet (40 mg total) by mouth 2 (two) times daily.   predniSONE 20 MG tablet Commonly known as:  DELTASONE Take three (3) tablets by mouth every morning with breakfast for seven (7) days, then afterwards take two (2) tablets by mouth every morning with breakfast until you see rheumatologist What changed:    how much to take  how to take this  when to take this  additional instructions      Allergies  Allergen Reactions  . Lisinopril Anaphylaxis    Angioedema  . Vicodin [Hydrocodone-Acetaminophen] Itching and Rash   Follow-up Information    Rosita Fire, MD Follow up on 02/15/2018.   Specialties:  Nephrology, Internal Medicine Why:  f/u kidney doctor appt February 15, 2018 please arrive at 8:00 am, Dr Ebony Cargo information: [name]  [place] [address] [phone]        Tonette Bihari, MD. Schedule an appointment as soon as possible for a visit in 1 week(s).   Specialty:  Family Medicine Contact information: [place] [address] [phone]            The results of significant diagnostics from this hospitalization (including imaging, microbiology, ancillary and laboratory) are listed below for reference.    Significant Diagnostic Studies: Dg Chest 1 View  Result Date: 01/24/2018 CLINICAL DATA:  Hematemesis.  Epigastric pain. EXAM: CHEST  1 VIEW COMPARISON:  Two-view chest x-ray 11/29/2017 FINDINGS: Heart remains mildly enlarged. There is no edema or effusion. No focal airspace disease is present. The visualized soft tissues and bony thorax are  unremarkable. IMPRESSION: 1. Mild cardiomegaly without failure. 2. No acute cardiopulmonary disease. Electronically Signed   By: San Morelle M.D.   On: 01/24/2018 07:34   Ct Soft Tissue Neck W Contrast  Result Date: 01/06/2018 CLINICAL DATA:  Difficulty swallowing with tongue swelling. EXAM: CT NECK WITH CONTRAST TECHNIQUE: Multidetector CT imaging of the neck was performed using the standard protocol following the bolus administration of intravenous contrast. CONTRAST:  46mL ISOVUE-300 IOPAMIDOL (ISOVUE-300) INJECTION 61% COMPARISON:  None. FINDINGS: Pharynx and larynx: The tongue fills the oral cavity and is somewhat low-density. There is mild edematous appearance at the floor of mouth, best seen on coronal reformats both superficial and deep to the mylohyoid. This could reflect infection or angioedema. No laryngeal swelling or airway narrowing. Salivary glands: Negative. No asymmetric enlargement or enhancement of the sublingual glands. Thyroid: Negative Lymph nodes: None enlarged or abnormal density. Vascular: Negative Limited intracranial: Negative Visualized orbits: Negative Mastoids and visualized paranasal sinuses: Clear Skeleton: No acute or aggressive finding. Upper chest: Negative Other: There is subcutaneous calcification along the bilateral cheek and left submandibular region. IMPRESSION: 1. Mild edema in the floor of mouth and submental region. This could reflect deep space infection; there is no visible underlying odontogenic or salivary source. No abscess. Patient also has history of angioedema. 2. Low-density appearance of the tongue, symmetry favoring fatty infiltration over edema. 3. Negative for laryngeal edema or airway stenosis. 4. Dermal based calcifications in the bilateral face which is nonspecific. Question if this is manifestation of patient's mixed connective tissue disease. Electronically Signed   By: Monte Fantasia M.D.   On: 01/06/2018 10:19   Ct Abdomen Pelvis W  Contrast  Result Date: 01/24/2018 CLINICAL DATA:  Pt reports hematemesis x1 day accompanied by generalized abdominal pain, Hx lupus and HTN EXAM: CT ABDOMEN AND PELVIS WITH CONTRAST TECHNIQUE: Multidetector CT imaging of the abdomen and pelvis was performed using the standard protocol following bolus administration of intravenous contrast. CONTRAST:  127mL ISOVUE-300 IOPAMIDOL (ISOVUE-300) INJECTION 61% COMPARISON:  11/29/2017 FINDINGS: Lower chest: Previous pericardial effusion has resolved. Mild cardiomegaly. Interventricular septal thickness 2.0 cm, query left ventricular hypertrophy. Hepatobiliary: Stable 0.7 by 0.5 cm hypodense lesion in segment IV a of the liver on image 13/2, statistically likely to be a cyst or similar small benign lesion but technically nonspecific. The liver and gallbladder appear otherwise unremarkable. Pancreas: Unremarkable Spleen: Unremarkable Adrenals/Urinary Tract: Cortical thinning in both kidneys. Wall thickening in the urinary bladder especially along the right upper urinary bladder as shown on image 58/5. Stomach/Bowel: The appendix sits down in the pelvis just above the urinary bladder. The appendix is seen above the cecum on image 52/4 and appears normal. I do not see an inflammatory process involving the  cecum, terminal ileum, or appendix to suggest that the right bladder roof wall thickening is due to secondary inflammation. There is wall thickening in the distal third portion of the duodenum on images 27 through 36 of series 2. Vascular/Lymphatic: Unremarkable Reproductive: Unremarkable Other: Trace free pelvic fluid, likely physiologic. Musculoskeletal: No CT findings of avascular necrosis of the hips. 4 mm anterolisthesis associated with bilateral chronic pars defects at L5. Small central disc protrusion at L4-5. IMPRESSION: 1. Abnormal wall thickening along the right anterior roof of the urinary bladder with very faint stranding in the adjacent adipose tissues. No  adjacent bowel abnormality is identified in the appendix appears normal. This could be an unusual manifestation of lupus cystitis, tuberculous cystitis, eosinophilic cystitis, hemorrhage into the bladder wall, or malakoplakia. 2. There is mild wall thickening in the distal transverse duodenum extending up towards the force portion of the duodenum. Appearance favors nonspecific inflammation. 3. Mild cardiomegaly. Previous pericardial effusion has resolved. Thickened interventricular septum, query left ventricular hypertrophy. 6 mm hypodense lesion in segment IV a of the liver, probably a small cyst but technically nonspecific due to small size. 4. Cortical thinning in both kidneys. 5. Bilateral chronic pars defects at L5 with 4 mm anterolisthesis of L5 on S1. Small disc protrusion at L4-5. Electronically Signed   By: Van Clines M.D.   On: 01/24/2018 09:13    Microbiology: Recent Results (from the past 240 hour(s))  Urine culture     Status: Abnormal   Collection Time: 01/24/18 10:27 AM  Result Value Ref Range Status   Specimen Description   Final    URINE, RANDOM Performed at [facility] [address], [place], [place] [address]    Special Requests   Final    NONE Performed at [facility], [place] [address], [place], [place] [address]    Culture (A)  Final    60,000 COLONIES/mL GROUP B STREP(S.AGALACTIAE)ISOLATED TESTING AGAINST S. AGALACTIAE NOT ROUTINELY PERFORMED DUE TO PREDICTABILITY OF AMP/PEN/VAN SUSCEPTIBILITY. Performed at [facility], [place] [address] [place], [place] [address]    Report Status 01/25/2018 FINAL  Final     Labs: Basic Metabolic Panel: Recent Labs  Lab 01/24/18 0903 01/24/18 1327 01/25/18 0534 01/26/18 0523 01/27/18 0545 01/28/18 0601  NA 133* 134* 136 133* 135 137  K 3.8 2.8* 3.5 3.5 3.5 3.6  CL 99* 97* 100* 97* 98* 96*  CO2 18* 24 25 24 27 29   GLUCOSE 73 89 112* 122* 111* 102*  BUN 20 18 16 15 20  22*    CREATININE 1.48* 1.45* 1.37* 1.35* 1.53* 1.57*  CALCIUM 7.9* 8.6* 9.2 9.3 9.6 9.8  MG 1.5*  --   --   --   --   --    Liver Function Tests: Recent Labs  Lab 01/24/18 0623 01/25/18 0534  AST 18 14*  ALT 15 13*  ALKPHOS 60 48  BILITOT 1.5* 0.9  PROT 7.7 6.6  ALBUMIN 3.9 3.3*   Recent Labs  Lab 01/24/18 0623  LIPASE 28   No results for input(s): AMMONIA in the last 168 hours. CBC: Recent Labs  Lab 01/24/18 0630  01/25/18 0535 01/25/18 1034 01/26/18 0523 01/27/18 0545 01/28/18 0601  WBC 11.8*   < > 10.3 12.5* 10.4 17.5* 17.4*  NEUTROABS 8.1*  --   --   --  9.1* 13.6* 12.6*  HGB 12.9   < > 10.4* 11.4* 11.0* 11.1* 12.8  HCT 38.5   < > 32.1* 35.3*  34.8* 35.3* 39.6  MCV 94.1   < > 95.8 97.2 98.3 98.1 98.3  PLT 183   < > 134* 137* 128* 152 137*   < > = values in this interval not displayed.   Cardiac Enzymes: No results for input(s): CKTOTAL, CKMB, CKMBINDEX, TROPONINI in the last 168 hours. BNP: BNP (last 3 results) Recent Labs    11/29/17 1324  BNP 1,781.4*    ProBNP (last 3 results) No results for input(s): PROBNP in the last 8760 hours.  CBG: No results for input(s): GLUCAP in the last 168 hours.     Signed:  Alma Friendly, MD [facility] 01/28/2018, 1:53 PM

## 2018-01-28 NOTE — Progress Notes (Signed)
Patient insists that she is not getting enough pain medication. Patient states that her pain is 10/10,sharp,gnawing pain in the abdomen. PCP was notified

## 2018-01-28 NOTE — Consult Note (Signed)
Renal Service Consult Note [name]  Linda [name] 01/28/2018 [name] Requesting Physician:  Dr Horris Latino  Reason for Consult:  Renal failure , SLE HPI: The patient is a 22 y.o. year-old with hx of lupus/ mctd, AKI, migraine headaches and hypertension presented to ED on 01/24/18 with bleeding tongue ulcer, tongue swelling and difficulty swallowing and abd pain. In ED bp's were high w/ dBP > 130.  The patient was seen for this issue by rheum and started on 60 mg daily pred x 1wk then taper for suspected ulcer due to sle. She was admitted and started on po bp meds (coreg/ chlorthalidone/ norvasc) and IV cardene drip.  CT showed inflamed duodenum, started on bid PPI and possible UTI started on rocephin. Seen by ENT who noted that necrotic tongue lesions had healed but had a new hypervasc lesion at the anterior tongue likely secondary to autoimmune causes.  Attempt was made to transfer patient to [facility] but she was not accepted. Here the creatinine is 1.3- 1.5, asked to see for renal failure.    patient states she was dx'd w/ MCTD / sle at age 72yo and took medication for several years (skin and joints) then lost her insurance/ medicaid and went w/o sle medicine for a few years now has regained insurance she says and is taking pred 43 / d for now and is supposed to start an Benlysta soon.   she denies sob or cp, abd pain still present , n/v gone, dysuria better since rx of uti, chronic skin problems and joint pains no diffreent than usual     date  Creat  notes  2015- 17 0.7  2018  0.7- 1.5  aki episode 1.5 > 0.06 Sep 2017 0.05 Nov 2017 0.8- 1.30 Nov 2017 1.00- 1.37  may 2019 1.21- 1.57   ROS  denies CP  no joint pain   no HA  no blurry vision  no rash  no diarrhea  no nausea/ vomiting  no dysuria  no difficulty voiding  no change in urine color    Past Medical History  Past Medical History:  Diagnosis Date  . Acute kidney injury ([place])   . Angioedema    . Hypertension   . Lupus ([place])   . Migraines   . Mixed connective tissue disease [facility])    Past Surgical History  Past Surgical History:  Procedure Laterality Date  . I&D EXTREMITY Right 08/06/2017   Procedure: IRRIGATION AND DEBRIDEMENT KNEE;  Surgeon: Shona Needles, MD;  Location: [place];  Service: Orthopedics;  Laterality: Right;  . MULTIPLE TOOTH EXTRACTIONS     Family History  Family History  Problem Relation Age of Onset  . Lupus Mother   . Hypertension Mother   . Kidney disease Mother   . Diabetes Maternal Grandmother    Social History  reports that she has been smoking cigarettes and cigars.  She started smoking about 2 years ago. She has been smoking about 0.00 packs per day. She has never used smokeless tobacco. She reports that she does not drink alcohol or use drugs. Allergies  Allergies  Allergen Reactions  . Lisinopril Anaphylaxis    Angioedema  . Vicodin [Hydrocodone-Acetaminophen] Itching and Rash   Home medications Prior to Admission medications   Medication Sig Start Date End Date Taking? Authorizing Provider  amLODipine (NORVASC) 10 MG tablet Take 1 tablet (10 mg total) by mouth daily. 01/03/18  Yes Doreatha Lew, MD  chlorthalidone (  HYGROTON) 50 MG tablet Take 1 tablet (50 mg total) by mouth daily. 01/04/18  Yes Doreatha Lew, MD  magic mouthwash w/lidocaine SOLN Take 5 mLs by mouth 4 (four) times daily for 14 days. Swish solution in your mouth for 1-2 minutes and then spit out. 01/18/18 02/01/18 Yes Quentin Cornwall, [place] N, PA-C  oxyCODONE-acetaminophen (PERCOCET/ROXICET) 5-325 MG tablet Take 1 tablet by mouth 4 (four) times daily as needed for pain. 01/14/18  Yes [provider]  predniSONE (DELTASONE) 20 MG tablet Take 3 tablets (60 mg total) by mouth daily with breakfast. 01/09/18  Yes Charlynne Cousins, MD  carvedilol (COREG CR) 10 MG 24 hr capsule Take 1 capsule (10 mg total) by mouth daily. Patient not taking: Reported on 01/24/2018 01/04/18    Patrecia Pour, Christean Grief, MD  fluconazole (DIFLUCAN) 100 MG tablet Take 1 tablet (100 mg total) by mouth daily. Patient not taking: Reported on 01/24/2018 01/08/18   Charlynne Cousins, MD   Liver Function Tests Recent Labs  Lab 01/24/18 [phone] 01/25/18 0534  AST 18 14*  ALT 15 13*  ALKPHOS 60 48  BILITOT 1.5* 0.9  PROT 7.7 6.6  ALBUMIN 3.9 3.3*   Recent Labs  Lab 01/24/18 0623  LIPASE 28   CBC Recent Labs  Lab 01/26/18 0523 01/27/18 0545 01/28/18 0601  WBC 10.4 17.5* 17.4*  NEUTROABS 9.1* 13.6* 12.6*  HGB 11.0* 11.1* 12.8  HCT 34.8* 35.3* 39.6  MCV 98.3 98.1 98.3  PLT 128* 152 595*   Basic Metabolic Panel Recent Labs  Lab 01/24/18 0623 01/24/18 0903 01/24/18 1327 01/25/18 0534 01/26/18 0523 01/27/18 0545 01/28/18 0601  NA 129* 133* 134* 136 133* 135 137  K 2.4* 3.8 2.8* 3.5 3.5 3.5 3.6  CL 88* 99* 97* 100* 97* 98* 96*  CO2 26 18* 24 25 24 27 29   GLUCOSE 87 73 89 112* 122* 111* 102*  BUN 20 20 18 16 15 20  22*  CREATININE 1.57* 1.48* 1.45* 1.37* 1.35* 1.53* 1.57*  CALCIUM 9.2 7.9* 8.6* 9.2 9.3 9.6 9.8   Iron/TIBC/Ferritin/ %Sat No results found for: IRON, TIBC, FERRITIN, IRONPCTSAT  Vitals:   01/27/18 2148 01/27/18 2149 01/28/18 0118 01/28/18 0520  BP: (!) 149/101 (!) 149/101 (!) 150/101 133/88  Pulse: (!) 49 (!) 49 (!) 45 (!) 54  Resp:    18  Temp:    98.2 F (36.8 C)  TempSrc:    Oral  SpO2:    96%  Weight:      Height:       Exam Gen diffuse chronic skin rash/ scarring/ discoloration of face No rash, cyanosis or gangrene Sclera anicteric, throat clear  No jvd or bruits Chest clear bilat RRR no MRG Abd soft ntnd no mass or ascites +bs GU defer MS no joint effusions or deformity Ext no leg or UE edema, no wounds or ulcers Neuro is alert, Ox 3 , nf    date                Creat               notes  2015- 17         0.7  2018               0.7- 1.5            aki episode 1.5 > 0.06 Sep 2017         0.05 Nov 2017  0.8- 1.30 Nov 2017          1.00- 1.37  may 2019       1.21- 1.57     renal US aug 2018 > 10/ 11.8 cm kidneys no hydro or ^ echo  ua 3/19 - prot 30, 0-5 rbc/ wbc  ua 12/24/17 - prot 100, 0-5 rbc/ wbc  ua 5/10     - prot neg 0-5 rbc/ >50 wbc   ua 5/27     - prot 100 >50 rbc/ 11- 20 wbc    urine sediment (by undersigned 5/31) > 3-5 rbc/ 10-25 wbc per hpf, protein 30, no rbc or wbc cast , no casts of any kind  Impression: 1  Renal failure - stage II in patient w/ known SLE/ MCTD.  Creat has risen over the last 6 months.  Urinary sediment looks like infection but can't r/o low grade GN.  Would recommend r/u in our office w/ further evaluation, have made appt for June 18th, see dc summary.  Paitient knows to avoid nsaids and is allergic to acei's (angioedema).  No other suggestions at this time. Will sign off.  2  Tongue ulcers/ bleeding - improved w/ prednisone 60/ d 3  SLE / MCTD - since age 70  Plan - as above  Kelly Splinter MD [employer] pager [phone]   01/28/2018, 10:02 AM

## 2018-01-29 ENCOUNTER — Encounter: Payer: Self-pay | Admitting: Otolaryngology

## 2018-02-10 ENCOUNTER — Other Ambulatory Visit (HOSPITAL_COMMUNITY)
Admission: RE | Admit: 2018-02-10 | Discharge: 2018-02-10 | Disposition: A | Discharge status: Home / Self Care | Payer: Medicaid Other | Source: Ambulatory Visit | Attending: Obstetrics | Admitting: Obstetrics

## 2018-02-10 ENCOUNTER — Encounter: Payer: Self-pay | Admitting: Obstetrics

## 2018-02-10 ENCOUNTER — Ambulatory Visit (INDEPENDENT_AMBULATORY_CARE_PROVIDER_SITE_OTHER): Payer: Medicaid Other | Admitting: Obstetrics

## 2018-02-10 VITALS — BP 155/114 | HR 97 | Wt 183.0 lb

## 2018-02-10 DIAGNOSIS — L732 Hidradenitis suppurativa: Secondary | ICD-10-CM | POA: Diagnosis not present

## 2018-02-10 DIAGNOSIS — M329 Systemic lupus erythematosus, unspecified: Secondary | ICD-10-CM

## 2018-02-10 DIAGNOSIS — Z30011 Encounter for initial prescription of contraceptive pills: Secondary | ICD-10-CM | POA: Diagnosis not present

## 2018-02-10 DIAGNOSIS — N898 Other specified noninflammatory disorders of vagina: Secondary | ICD-10-CM | POA: Diagnosis present

## 2018-02-10 DIAGNOSIS — R102 Pelvic and perineal pain: Secondary | ICD-10-CM | POA: Diagnosis not present

## 2018-02-10 DIAGNOSIS — Z3009 Encounter for other general counseling and advice on contraception: Secondary | ICD-10-CM | POA: Diagnosis not present

## 2018-02-10 LAB — POCT URINALYSIS DIPSTICK
Bilirubin, UA: NEGATIVE
Glucose, UA: NEGATIVE
Ketones, UA: NEGATIVE
Leukocytes, UA: NEGATIVE
Nitrite, UA: NEGATIVE
Protein, UA: POSITIVE — AB
Spec Grav, UA: 1.01 (ref 1.010–1.025)
Urobilinogen, UA: 0.2 E.U./dL
pH, UA: 7.5 (ref 5.0–8.0)

## 2018-02-10 MED ORDER — LEVONORGEST-ETH ESTRADIOL-IRON 0.1-20 MG-MCG(21) PO TABS: 1.0000 | ORAL_TABLET | Freq: Every day | ORAL | 11 refills | Status: DC

## 2018-02-10 MED ORDER — CLINDAMYCIN PHOSPHATE 1 % EX SOLN: Freq: Two times a day (BID) | CUTANEOUS | 5 refills | Status: DC

## 2018-02-10 NOTE — Progress Notes (Signed)
Patient states after intercourse she has vaginal bleeding . Pt also notes pelvic pain. Pt states on yesterday she noticed discharge but none today discharge (yellow).  Pain w/ intercourse as well.   Pt notices small bumps on skin x 4 months now wants Rx cream to help.

## 2018-02-11 NOTE — Progress Notes (Signed)
Patient ID: Linda [name], female   DOB: [birth year], 22 y.o.   MRN: [id number]  Chief Complaint  Patient presents with  . Vaginal Discharge    HPI Linda [name] is a 22 y.o. female.  Vaginal bleeding after intercourse.  Pelvic pain.  Has recurrent sores under arms and between thighs. HPI  Past Medical History:  Diagnosis Date  . Acute kidney injury ([place])   . Angioedema   . Hypertension   . Lupus ([place])   . Migraines   . Mixed connective tissue disease [facility])     Past Surgical History:  Procedure Laterality Date  . I&D EXTREMITY Right 08/06/2017   Procedure: IRRIGATION AND DEBRIDEMENT KNEE;  Surgeon: Shona Needles, MD;  Location: [place];  Service: Orthopedics;  Laterality: Right;  . MULTIPLE TOOTH EXTRACTIONS      Family History  Problem Relation Age of Onset  . Lupus Mother   . Hypertension Mother   . Kidney disease Mother   . Diabetes Maternal Grandmother     Social History Social History   Tobacco Use  . Smoking status: Current Every Day Smoker    Packs/day: 0.00    Types: Cigarettes, Cigars    Start date: 02/11/2015  . Smokeless tobacco: Never Used  Substance Use Topics  . Alcohol use: No    Alcohol/week: 0.0 oz    Comment: social  . Drug use: No    Allergies  Allergen Reactions  . Lisinopril Anaphylaxis    Angioedema  . Vicodin [Hydrocodone-Acetaminophen] Itching and Rash    Current Outpatient Medications  Medication Sig Dispense Refill  . amLODipine (NORVASC) 10 MG tablet Take 1 tablet (10 mg total) by mouth daily. 30 tablet 0  . carvedilol (COREG CR) 10 MG 24 hr capsule Take 1 capsule (10 mg total) by mouth daily. 30 capsule 0  . chlorthalidone (HYGROTON) 50 MG tablet Take 1 tablet (50 mg total) by mouth daily. 30 tablet 0  . clindamycin (CLEOCIN-T) 1 % external solution Apply topically 2 (two) times daily. 60 mL 5  . cloNIDine (CATAPRES) 0.1 MG tablet Take 1 tablet (0.1 mg total) by mouth 3 (three) times daily. 60 tablet 0  . Levonorgest-Eth  Estrad-Fe Bisg (BALCOLTRA) 0.1-20 MG-MCG(21) TABS Take 1 tablet by mouth daily. 28 tablet 11  . pantoprazole (PROTONIX) 40 MG tablet Take 1 tablet (40 mg total) by mouth 2 (two) times daily. 60 tablet 0  . predniSONE (DELTASONE) 20 MG tablet Take three (3) tablets by mouth every morning with breakfast for seven (7) days, then afterwards take two (2) tablets by mouth every morning with breakfast until you see rheumatologist 63 tablet 0   No current facility-administered medications for this visit.     Review of Systems Review of Systems Constitutional: negative for fatigue and weight loss Respiratory: negative for cough and wheezing Cardiovascular: negative for chest pain, fatigue and palpitations Gastrointestinal: negative for abdominal pain and change in bowel habits Genitourinary:POSITIVE for vaginal bleeding after intercourse and pelvic pain Integument/breast: POSITIVE for sores under arms and between legs Musculoskeletal:negative for myalgias Neurological: negative for gait problems and tremors Behavioral/Psych: negative for abusive relationship, depression Endocrine: negative for temperature intolerance      Blood pressure (!) 155/114, pulse 97, weight 183 lb (83 kg), last menstrual period 01/19/2018.  Physical Exam Physical Exam General:   alert and no distress  Skin:   multiple sores in axilla and upper inner thighs  Lungs:   clear to auscultation bilaterally  Heart:  regular rate and rhythm, S1, S2 normal, no murmur, click, rub or gallop  Breasts:   normal without suspicious masses, skin or nipple changes or axillary nodes, but multiple sores in axilla  Abdomen:  normal findings: no organomegaly, soft, non-tender and no hernia  Pelvis:  External genitalia: normal general appearance Urinary system: urethral meatus normal and bladder without fullness, nontender Vaginal: normal without tenderness, induration or masses Cervix: normal appearance Adnexa: normal bimanual  exam Uterus: anteverted and non-tender, normal size    50% of 15 min visit spent on counseling and coordination of care.   Data Reviewed Wet Prep  Assessment and Plan:    1. Vaginal discharge Rx: - Cervicovaginal ancillary only  2. Pelvic pain Rx: - POCT Urinalysis Dipstick - US PELVIC COMPLETE WITH TRANSVAGINAL; Future  3. Encounter for other general counseling and advice on contraception - wants OCP's  4. Encounter for initial prescription of contraceptive pills Rx: - Levonorgest-Eth Estrad-Fe Bisg (BALCOLTRA) 0.1-20 MG-MCG(21) TABS; Take 1 tablet by mouth daily.  Dispense: 28 tablet; Refill: 11  5. Hidradenitis axillaris Rx: - clindamycin (CLEOCIN-T) 1 % external solution; Apply topically 2 (two) times daily.  Dispense: 60 mL; Refill: 5  6. Systemic lupus erythematosus, unspecified SLE type, unspecified organ involvement status ([place]) - managed by PCP    Plan     Follow up in 2 weeks  Orders Placed This Encounter  Procedures  . US PELVIC COMPLETE WITH TRANSVAGINAL    Standing Status:   Future    Standing Expiration Date:   04/13/2019    Order Specific Question:   Reason for Exam (SYMPTOM  OR DIAGNOSIS REQUIRED)    Answer:   Pelvic pain    Order Specific Question:   Preferred imaging location?    Answer:   [facility]  . POCT Urinalysis Dipstick   Meds ordered this encounter  Medications  . Levonorgest-Eth Estrad-Fe Bisg (BALCOLTRA) 0.1-20 MG-MCG(21) TABS    Sig: Take 1 tablet by mouth daily.    Dispense:  28 tablet    Refill:  11  . clindamycin (CLEOCIN-T) 1 % external solution    Sig: Apply topically 2 (two) times daily.    Dispense:  60 mL    Refill:  5     Shelly Bombard MD 02-10-2018

## 2018-02-12 LAB — CERVICOVAGINAL ANCILLARY ONLY
Bacterial vaginitis: NEGATIVE
Candida vaginitis: NEGATIVE
Chlamydia: NEGATIVE
Neisseria Gonorrhea: NEGATIVE
Trichomonas: NEGATIVE

## 2018-02-16 ENCOUNTER — Telehealth: Payer: Self-pay

## 2018-02-16 ENCOUNTER — Ambulatory Visit (HOSPITAL_COMMUNITY): Payer: Medicaid Other | Attending: Obstetrics

## 2018-02-16 NOTE — Telephone Encounter (Signed)
TC from pt requesting lab results from last week. Pt stated her cell phone is currently inactive would like email. email [email or web address]

## 2018-02-16 NOTE — Telephone Encounter (Signed)
Can you release pt labs from recent visit w/ Dr. Jodi Mourning pt phone is currently off can receive emails.

## 2018-02-17 ENCOUNTER — Other Ambulatory Visit: Payer: Self-pay | Admitting: Certified Nurse Midwife

## 2018-02-17 NOTE — Telephone Encounter (Signed)
Patient does not need birth control pills with estrogen since she has uncontrolled hypertension.  Needs POP or progesterone only contraception.  Needs A1C.  Her swab was normal. Not sure if she had her pelvic US yet or not.  Blessings; Ernst Bowler

## 2018-02-23 ENCOUNTER — Ambulatory Visit: Payer: Medicaid Other | Admitting: Obstetrics

## 2018-03-02 ENCOUNTER — Observation Stay (HOSPITAL_COMMUNITY)
Admission: EM | Admit: 2018-03-02 | Discharge: 2018-03-03 | Disposition: A | Discharge status: Home / Self Care | Payer: Medicaid Other | Attending: Internal Medicine | Admitting: Internal Medicine

## 2018-03-02 ENCOUNTER — Emergency Department (HOSPITAL_COMMUNITY): Payer: Medicaid Other

## 2018-03-02 ENCOUNTER — Encounter (HOSPITAL_COMMUNITY): Payer: Self-pay | Admitting: *Deleted

## 2018-03-02 DIAGNOSIS — Z79899 Other long term (current) drug therapy: Secondary | ICD-10-CM | POA: Insufficient documentation

## 2018-03-02 DIAGNOSIS — Z8739 Personal history of other diseases of the musculoskeletal system and connective tissue: Secondary | ICD-10-CM

## 2018-03-02 DIAGNOSIS — E876 Hypokalemia: Secondary | ICD-10-CM | POA: Diagnosis not present

## 2018-03-02 DIAGNOSIS — I160 Hypertensive urgency: Secondary | ICD-10-CM | POA: Diagnosis not present

## 2018-03-02 DIAGNOSIS — D72829 Elevated white blood cell count, unspecified: Secondary | ICD-10-CM | POA: Insufficient documentation

## 2018-03-02 DIAGNOSIS — F17210 Nicotine dependence, cigarettes, uncomplicated: Secondary | ICD-10-CM | POA: Diagnosis not present

## 2018-03-02 DIAGNOSIS — R109 Unspecified abdominal pain: Secondary | ICD-10-CM | POA: Diagnosis not present

## 2018-03-02 DIAGNOSIS — Z720 Tobacco use: Secondary | ICD-10-CM | POA: Diagnosis present

## 2018-03-02 DIAGNOSIS — L930 Discoid lupus erythematosus: Secondary | ICD-10-CM | POA: Diagnosis not present

## 2018-03-02 DIAGNOSIS — M351 Other overlap syndromes: Secondary | ICD-10-CM | POA: Diagnosis present

## 2018-03-02 DIAGNOSIS — N179 Acute kidney failure, unspecified: Secondary | ICD-10-CM | POA: Diagnosis not present

## 2018-03-02 DIAGNOSIS — K529 Noninfective gastroenteritis and colitis, unspecified: Secondary | ICD-10-CM | POA: Diagnosis present

## 2018-03-02 DIAGNOSIS — F17290 Nicotine dependence, other tobacco product, uncomplicated: Secondary | ICD-10-CM | POA: Insufficient documentation

## 2018-03-02 DIAGNOSIS — R112 Nausea with vomiting, unspecified: Secondary | ICD-10-CM | POA: Diagnosis present

## 2018-03-02 DIAGNOSIS — N185 Chronic kidney disease, stage 5: Secondary | ICD-10-CM | POA: Diagnosis present

## 2018-03-02 DIAGNOSIS — G8929 Other chronic pain: Secondary | ICD-10-CM | POA: Diagnosis not present

## 2018-03-02 DIAGNOSIS — I129 Hypertensive chronic kidney disease with stage 1 through stage 4 chronic kidney disease, or unspecified chronic kidney disease: Secondary | ICD-10-CM | POA: Insufficient documentation

## 2018-03-02 DIAGNOSIS — N182 Chronic kidney disease, stage 2 (mild): Secondary | ICD-10-CM

## 2018-03-02 DIAGNOSIS — R197 Diarrhea, unspecified: Secondary | ICD-10-CM | POA: Diagnosis present

## 2018-03-02 DIAGNOSIS — E860 Dehydration: Secondary | ICD-10-CM | POA: Diagnosis not present

## 2018-03-02 DIAGNOSIS — K219 Gastro-esophageal reflux disease without esophagitis: Secondary | ICD-10-CM | POA: Insufficient documentation

## 2018-03-02 LAB — COMPREHENSIVE METABOLIC PANEL
ALT: 12 U/L (ref 0–44)
AST: 22 U/L (ref 15–41)
Albumin: 4.7 g/dL (ref 3.5–5.0)
Alkaline Phosphatase: 85 U/L (ref 38–126)
Anion gap: 13 (ref 5–15)
BUN: 22 mg/dL — ABNORMAL HIGH (ref 6–20)
CO2: 30 mmol/L (ref 22–32)
Calcium: 10.3 mg/dL (ref 8.9–10.3)
Chloride: 93 mmol/L — ABNORMAL LOW (ref 98–111)
Creatinine, Ser: 2.59 mg/dL — ABNORMAL HIGH (ref 0.44–1.00)
GFR calc Af Amer: 29 mL/min — ABNORMAL LOW (ref >60)
GFR calc non Af Amer: 25 mL/min — ABNORMAL LOW (ref >60)
Glucose, Bld: 94 mg/dL (ref 70–99)
Potassium: 2.6 mmol/L — CL (ref 3.5–5.1)
Sodium: 136 mmol/L (ref 135–145)
Total Bilirubin: 1.4 mg/dL — ABNORMAL HIGH (ref 0.3–1.2)
Total Protein: 9 g/dL — ABNORMAL HIGH (ref 6.5–8.1)

## 2018-03-02 LAB — CBC WITH DIFFERENTIAL/PLATELET
Basophils Absolute: 0.1 10*3/uL (ref 0.0–0.1)
Basophils Relative: 1 %
Eosinophils Absolute: 0.2 10*3/uL (ref 0.0–0.7)
Eosinophils Relative: 2 %
HCT: 45.7 % (ref 36.0–46.0)
Hemoglobin: 15.5 g/dL — ABNORMAL HIGH (ref 12.0–15.0)
Lymphocytes Relative: 27 %
Lymphs Abs: 3 10*3/uL (ref 0.7–4.0)
MCH: 32.9 pg (ref 26.0–34.0)
MCHC: 33.9 g/dL (ref 30.0–36.0)
MCV: 97 fL (ref 78.0–100.0)
Monocytes Absolute: 1 10*3/uL (ref 0.1–1.0)
Monocytes Relative: 9 %
Neutro Abs: 6.8 10*3/uL (ref 1.7–7.7)
Neutrophils Relative %: 61 %
Platelets: 244 10*3/uL (ref 150–400)
RBC: 4.71 MIL/uL (ref 3.87–5.11)
RDW: 13.6 % (ref 11.5–15.5)
WBC: 11.1 10*3/uL — ABNORMAL HIGH (ref 4.0–10.5)

## 2018-03-02 LAB — URINALYSIS, ROUTINE W REFLEX MICROSCOPIC
Bilirubin Urine: NEGATIVE
Glucose, UA: NEGATIVE mg/dL
Ketones, ur: NEGATIVE mg/dL
Leukocytes, UA: NEGATIVE
Nitrite: NEGATIVE
Protein, ur: 100 mg/dL — AB
Specific Gravity, Urine: 1.006 (ref 1.005–1.030)
pH: 7 (ref 5.0–8.0)

## 2018-03-02 LAB — I-STAT TROPONIN, ED: Troponin i, poc: 0.03 ng/mL (ref 0.00–0.08)

## 2018-03-02 LAB — I-STAT BETA HCG BLOOD, ED (MC, WL, AP ONLY): I-stat hCG, quantitative: <5 m[IU]/mL (ref <5)

## 2018-03-02 LAB — LIPASE, BLOOD: Lipase: 33 U/L (ref 11–51)

## 2018-03-02 LAB — MAGNESIUM: Magnesium: 2 mg/dL (ref 1.7–2.4)

## 2018-03-02 LAB — I-STAT CG4 LACTIC ACID, ED: Lactic Acid, Venous: 1.28 mmol/L (ref 0.5–1.9)

## 2018-03-02 MED ORDER — POTASSIUM CHLORIDE CRYS ER 20 MEQ PO TBCR
40.0000 meq | EXTENDED_RELEASE_TABLET | Freq: Once | ORAL | Status: AC
Start: 2018-03-02 — End: 2018-03-02
  Administered 2018-03-02: 40 meq via ORAL
  Filled 2018-03-02: qty 2

## 2018-03-02 MED ORDER — POTASSIUM CHLORIDE 10 MEQ/100ML IV SOLN
10.0000 meq | INTRAVENOUS | Status: AC
  Administered 2018-03-02 (×3): 10 meq via INTRAVENOUS
  Filled 2018-03-02 (×3): qty 100

## 2018-03-02 MED ORDER — HYDROMORPHONE HCL 1 MG/ML IJ SOLN
1.0000 mg | Freq: Once | INTRAMUSCULAR | Status: AC | PRN
  Administered 2018-03-02: 1 mg via INTRAVENOUS
  Filled 2018-03-02: qty 1

## 2018-03-02 MED ORDER — METHYLPREDNISOLONE SODIUM SUCC 125 MG IJ SOLR
60.0000 mg | Freq: Every day | INTRAMUSCULAR | Status: DC
  Administered 2018-03-02 – 2018-03-03 (×2): 60 mg via INTRAVENOUS
  Filled 2018-03-02 (×2): qty 2

## 2018-03-02 MED ORDER — SODIUM CHLORIDE 0.9 % IV BOLUS
1000.0000 mL | Freq: Once | INTRAVENOUS | Status: AC
  Administered 2018-03-02: 1000 mL via INTRAVENOUS

## 2018-03-02 MED ORDER — CLONIDINE HCL 0.2 MG/24HR TD PTWK
0.2000 mg | MEDICATED_PATCH | TRANSDERMAL | Status: DC
  Administered 2018-03-02: 0.2 mg via TRANSDERMAL
  Filled 2018-03-02: qty 1

## 2018-03-02 MED ORDER — METOCLOPRAMIDE HCL 5 MG/ML IJ SOLN
5.0000 mg | Freq: Three times a day (TID) | INTRAMUSCULAR | Status: DC
  Administered 2018-03-02 – 2018-03-03 (×3): 5 mg via INTRAVENOUS
  Filled 2018-03-02 (×3): qty 2

## 2018-03-02 MED ORDER — HYDROMORPHONE HCL 1 MG/ML IJ SOLN
1.0000 mg | Freq: Once | INTRAMUSCULAR | Status: AC
  Administered 2018-03-02: 1 mg via INTRAVENOUS
  Filled 2018-03-02: qty 1

## 2018-03-02 MED ORDER — SODIUM CHLORIDE 0.9 % IV SOLN
INTRAVENOUS | Status: DC
Start: 2018-03-02 — End: 2018-03-03
  Administered 2018-03-02 – 2018-03-03 (×2): via INTRAVENOUS

## 2018-03-02 MED ORDER — NICARDIPINE HCL IN NACL 20-0.86 MG/200ML-% IV SOLN
3.0000 mg/h | Freq: Once | INTRAVENOUS | Status: AC
  Administered 2018-03-02: 5 mg/h via INTRAVENOUS
  Filled 2018-03-02: qty 200

## 2018-03-02 MED ORDER — ONDANSETRON HCL 4 MG/2ML IJ SOLN: 4.0000 mg | Freq: Three times a day (TID) | INTRAMUSCULAR | Status: DC | PRN

## 2018-03-02 MED ORDER — ACETAMINOPHEN 325 MG PO TABS: 650.0000 mg | ORAL_TABLET | Freq: Four times a day (QID) | ORAL | Status: DC | PRN

## 2018-03-02 MED ORDER — ONDANSETRON HCL 4 MG/2ML IJ SOLN
4.0000 mg | Freq: Once | INTRAMUSCULAR | Status: AC
  Administered 2018-03-02: 4 mg via INTRAVENOUS
  Filled 2018-03-02: qty 2

## 2018-03-02 MED ORDER — NICOTINE 21 MG/24HR TD PT24
21.0000 mg | MEDICATED_PATCH | Freq: Every day | TRANSDERMAL | Status: DC
  Administered 2018-03-02: 21 mg via TRANSDERMAL
  Filled 2018-03-02: qty 1

## 2018-03-02 MED ORDER — ENOXAPARIN SODIUM 40 MG/0.4ML ~~LOC~~ SOLN
40.0000 mg | Freq: Every day | SUBCUTANEOUS | Status: DC
  Administered 2018-03-03: 40 mg via SUBCUTANEOUS
  Filled 2018-03-02: qty 0.4

## 2018-03-02 MED ORDER — AMLODIPINE BESYLATE 10 MG PO TABS
10.0000 mg | ORAL_TABLET | Freq: Every day | ORAL | Status: DC
  Administered 2018-03-03: 10 mg via ORAL
  Filled 2018-03-02: qty 1

## 2018-03-02 MED ORDER — ZOLPIDEM TARTRATE 5 MG PO TABS: 5.0000 mg | ORAL_TABLET | Freq: Every evening | ORAL | Status: DC | PRN

## 2018-03-02 MED ORDER — CHLORTHALIDONE 50 MG PO TABS
50.0000 mg | ORAL_TABLET | Freq: Every day | ORAL | Status: DC
  Administered 2018-03-02: 50 mg via ORAL
  Filled 2018-03-02: qty 1

## 2018-03-02 MED ORDER — CLONIDINE HCL 0.1 MG PO TABS
0.1000 mg | ORAL_TABLET | Freq: Once | ORAL | Status: AC
  Administered 2018-03-02: 0.1 mg via ORAL
  Filled 2018-03-02: qty 1

## 2018-03-02 MED ORDER — HYDROMORPHONE HCL 1 MG/ML IJ SOLN
1.0000 mg | Freq: Once | INTRAMUSCULAR | Status: DC
  Filled 2018-03-02: qty 1

## 2018-03-02 MED ORDER — POTASSIUM CHLORIDE CRYS ER 20 MEQ PO TBCR
40.0000 meq | EXTENDED_RELEASE_TABLET | Freq: Once | ORAL | Status: AC
  Administered 2018-03-02: 40 meq via ORAL
  Filled 2018-03-02: qty 2

## 2018-03-02 MED ORDER — PANTOPRAZOLE SODIUM 40 MG PO TBEC
40.0000 mg | DELAYED_RELEASE_TABLET | Freq: Two times a day (BID) | ORAL | Status: DC
  Administered 2018-03-02 – 2018-03-03 (×2): 40 mg via ORAL
  Filled 2018-03-02 (×2): qty 1

## 2018-03-02 MED ORDER — METHYLPREDNISOLONE SODIUM SUCC 40 MG IJ SOLR: 40.0000 mg | Freq: Every day | INTRAMUSCULAR | Status: DC

## 2018-03-02 MED ORDER — HYDROMORPHONE HCL 1 MG/ML IJ SOLN
1.0000 mg | INTRAMUSCULAR | Status: DC | PRN
Start: 2018-03-02 — End: 2018-03-03
  Administered 2018-03-02 – 2018-03-03 (×4): 1 mg via INTRAVENOUS
  Filled 2018-03-02 (×4): qty 1

## 2018-03-02 MED ORDER — HYDRALAZINE HCL 20 MG/ML IJ SOLN
5.0000 mg | INTRAMUSCULAR | Status: DC | PRN
  Administered 2018-03-03: 5 mg via INTRAVENOUS
  Filled 2018-03-02: qty 1

## 2018-03-02 MED ORDER — FAMOTIDINE IN NACL 20-0.9 MG/50ML-% IV SOLN
20.0000 mg | Freq: Two times a day (BID) | INTRAVENOUS | Status: DC
  Administered 2018-03-02 – 2018-03-03 (×2): 20 mg via INTRAVENOUS
  Filled 2018-03-02 (×2): qty 50

## 2018-03-02 NOTE — ED Notes (Signed)
Date and time results received: 03/02/18 1520 (use smartphrase ".now" to insert current time)  Test: K+ Critical Value: 2.6  Name of Provider Notified: Courtni, P.A.  Orders Received? Or Actions Taken?:

## 2018-03-02 NOTE — H&P (Signed)
History and Physical    Linda [name] ZSW:[id number] DOB: Aug 20, 1996 DOA: 03/02/2018  Referring MD/NP/PA:   PCP: Maqu[place]eta Bing, DO   Patient coming from:  The patient is coming from home.  At baseline, pt is independent for most of ADL.     Chief Complaint: Nausea, vomiting, diarrhea, abdominal pain  HPI: Linda [name] is a 22 y.o. female with medical history significant of mixed connective tissue disease, discoid lupus, hypertension, GERD, angioedema, migraine headaches, tobacco abuse, chronic abdominal pain,, who presents with nausea vomiting, diarrhea and abdominal pain.  Patient has chronic abdominal pain 2/2 enteritis, which was believed to be due to lupus.  Patient states that he started having abdominal pain again last night, which is located in the central abdomen, constant, 10 out of 10 severity, sharp, nonradiating.  It is associated with nausea and vomiting and diarrhea.  She vomited more than 10 times without blood in the vomitus.  She has had at least 5 times of loose stool bowel movement.  She has chills, no fever.  Patient does not have chest pain, shortness of breath, cough.  No symptoms of UTI or unilateral weakness.  Blood pressure elevated initially at SBP 200s, and IV Cardizem was started, blood pressure improved to 147/103 when I saw patient.  ED Course: pt was found to have WBC 11.1, lactic acid 1.28, negative troponin, lipase is 33, negative pregnancy test, negative urinalysis, potassium 2.6, worsening renal function, temperature 97.5, tachycardia, oxygen saturation 92 to 99% on room air.  Patient is admitted to telemetry bed as inpatient.  CT abdomen/pelvis showed: circumferential wall thickening of several small bowel loops within the LOWER abdomen and pelvis compatible with an enteritis.  Hydronephrosis.  Small to moderate free pelvic fluid. No pneumoperitoneum or bowel obstruction. No     Review of Systems:   General: no fevers, has chills, no body  weight gain, has poor appetite, has fatigue HEENT: no blurry vision, hearing changes or sore throat Respiratory: no dyspnea, coughing, wheezing CV: no chest pain, no palpitations GI: Nausea, vomiting, abdominal pain, diarrhea, no constipation GU: no dysuria, burning on urination, increased urinary frequency, hematuria  Ext: no leg edema Neuro: no unilateral weakness, numbness, or tingling, no vision change or hearing loss Skin: no rash, no skin tear. MSK: No muscle spasm, no deformity, no limitation of range of movement in spin Heme: No easy bruising.  Travel history: No recent long distant travel.  Allergy:  Allergies  Allergen Reactions  . Lisinopril Anaphylaxis    Angioedema  . Vicodin [Hydrocodone-Acetaminophen] Itching and Rash    Past Medical History:  Diagnosis Date  . Acute kidney injury ([place])   . Angioedema   . Hypertension   . Lupus ([place])   . Migraines   . Mixed connective tissue disease [facility])     Past Surgical History:  Procedure Laterality Date  . I&D EXTREMITY Right 08/06/2017   Procedure: IRRIGATION AND DEBRIDEMENT KNEE;  Surgeon: Shona Needles, MD;  Location: [place];  Service: Orthopedics;  Laterality: Right;  . MULTIPLE TOOTH EXTRACTIONS      Social History:  reports that she has been smoking cigarettes and cigars.  She started smoking about 3 years ago. She has been smoking about 0.00 packs per day. She has never used smokeless tobacco. She reports that she does not drink alcohol or use drugs.  Family History:  Family History  Problem Relation Age of Onset  . Lupus Mother   . Hypertension Mother   .  Kidney disease Mother   . Diabetes Maternal Grandmother      Prior to Admission medications   Medication Sig Start Date End Date Taking? Authorizing Provider  amLODipine (NORVASC) 10 MG tablet Take 1 tablet (10 mg total) by mouth daily. 01/28/18  Yes Alma Friendly, MD  chlorthalidone (HYGROTON) 50 MG tablet Take 1 tablet (50 mg total) by mouth  daily. 01/28/18  Yes Alma Friendly, MD  cloNIDine (CATAPRES) 0.1 MG tablet Take 1 tablet (0.1 mg total) by mouth 3 (three) times daily. 01/28/18  Yes Alma Friendly, MD  predniSONE (DELTASONE) 20 MG tablet Take three (3) tablets by mouth every morning with breakfast for seven (7) days, then afterwards take two (2) tablets by mouth every morning with breakfast until you see rheumatologist 01/28/18  Yes Alma Friendly, MD  carvedilol (COREG CR) 10 MG 24 hr capsule Take 1 capsule (10 mg total) by mouth daily. Patient not taking: Reported on 03/02/2018 01/28/18   Alma Friendly, MD  clindamycin (CLEOCIN-T) 1 % external solution Apply topically 2 (two) times daily. Patient not taking: Reported on 03/02/2018 02/10/18   Shelly Bombard, MD  Levonorgest-Eth Estrad-Fe Bisg (BALCOLTRA) 0.1-20 MG-MCG(21) TABS Take 1 tablet by mouth daily. Patient not taking: Reported on 03/02/2018 02/10/18   Shelly Bombard, MD  pantoprazole (PROTONIX) 40 MG tablet Take 1 tablet (40 mg total) by mouth 2 (two) times daily. 01/28/18 02/27/18  Alma Friendly, MD    Physical Exam: Vitals:   03/02/18 1955 03/02/18 2000 03/02/18 2015 03/02/18 2030  BP: (!) 152/103 (!) 151/106 (!) 160/113 (!) 152/110  Pulse: 95 95    Resp: '15 16 11 17  '$ Temp:      TempSrc:      SpO2: 99% 100%  100%   General: in moderate acute distress due to abdominal pain HEENT:       Eyes: PERRL, EOMI, no scleral icterus.       ENT: No discharge from the ears and nose, no pharynx injection, no tonsillar enlargement.        Neck: No JVD, no bruit, no mass felt. Heme: No neck lymph node enlargement. Cardiac: S1/S2, RRR, No murmurs, No gallops or rubs. Respiratory: No rales, wheezing, rhonchi or rubs. GI: Soft, nondistended, has tenderness in central abdomen, no rebound pain, no organomegaly, BS present. GU: No hematuria Ext: No pitting leg edema bilaterally. 2+DP/PT pulse bilaterally. Musculoskeletal: No joint deformities, No  joint redness or warmth, no limitation of ROM in spin. Skin: No rashes.  Neuro: Alert, oriented X3, cranial nerves II-XII grossly intact, moves all extremities normally.  Psych: Patient is not psychotic, no suicidal or hemocidal ideation.  Labs on Admission: I have personally reviewed following labs and imaging studies  CBC: Recent Labs  Lab 03/02/18 1443  WBC 11.1*  NEUTROABS 6.8  HGB 15.5*  HCT 45.7  MCV 97.0  PLT 885   Basic Metabolic Panel: Recent Labs  Lab 03/02/18 1443 03/02/18 1617  NA 136  --   K 2.6*  --   CL 93*  --   CO2 30  --   GLUCOSE 94  --   BUN 22*  --   CREATININE 2.59*  --   CALCIUM 10.3  --   MG  --  2.0   GFR: CrCl cannot be calculated (Unknown ideal weight.). Liver Function Tests: Recent Labs  Lab 03/02/18 1443  AST 22  ALT 12  ALKPHOS 85  BILITOT 1.4*  PROT 9.0*  ALBUMIN 4.7   Recent Labs  Lab 03/02/18 1443  LIPASE 33   No results for input(s): AMMONIA in the last 168 hours. Coagulation Profile: No results for input(s): INR, PROTIME in the last 168 hours. Cardiac Enzymes: No results for input(s): CKTOTAL, CKMB, CKMBINDEX, TROPONINI in the last 168 hours. BNP (last 3 results) No results for input(s): PROBNP in the last 8760 hours. HbA1C: No results for input(s): HGBA1C in the last 72 hours. CBG: No results for input(s): GLUCAP in the last 168 hours. Lipid Profile: No results for input(s): CHOL, HDL, LDLCALC, TRIG, CHOLHDL, LDLDIRECT in the last 72 hours. Thyroid Function Tests: No results for input(s): TSH, T4TOTAL, FREET4, T3FREE, THYROIDAB in the last 72 hours. Anemia Panel: No results for input(s): VITAMINB12, FOLATE, FERRITIN, TIBC, IRON, RETICCTPCT in the last 72 hours. Urine analysis:    Component Value Date/Time   COLORURINE STRAW (A) 03/02/2018 1730   APPEARANCEUR CLEAR 03/02/2018 1730   LABSPEC 1.006 03/02/2018 1730   PHURINE 7.0 03/02/2018 1730   GLUCOSEU NEGATIVE 03/02/2018 1730   HGBUR SMALL (A) 03/02/2018  1730   BILIRUBINUR NEGATIVE 03/02/2018 1730   BILIRUBINUR Negative 02/10/2018 1602   KETONESUR NEGATIVE 03/02/2018 1730   PROTEINUR 100 (A) 03/02/2018 1730   UROBILINOGEN 0.2 02/10/2018 1602   UROBILINOGEN 1.0 03/30/2015 2328   NITRITE NEGATIVE 03/02/2018 1730   LEUKOCYTESUR NEGATIVE 03/02/2018 1730   Sepsis Labs: '@LABRCNTIP'$ (procalcitonin:4,lacticidven:4) )No results found for this or any previous visit (from the past 240 hour(s)).   Radiological Exams on Admission: Ct Abdomen Pelvis Wo Contrast  Result Date: 03/02/2018 CLINICAL DATA:  22 year old female with acute abdominal pain and vomiting for 1 day. EXAM: CT ABDOMEN AND PELVIS WITHOUT CONTRAST TECHNIQUE: Multidetector CT imaging of the abdomen and pelvis was performed following the standard protocol without IV contrast. COMPARISON:  01/24/2018 and prior CTs FINDINGS: Please note that parenchymal abnormalities may be missed without intravenous contrast. Lower chest: No acute abnormality Hepatobiliary: The liver and gallbladder are unremarkable. Pancreas: Unremarkable Spleen: Unremarkable Adrenals/Urinary Tract: The kidneys, adrenal glands and bladder are unremarkable. Stomach/Bowel: There is circumferential wall thickening of multiple small bowel loops within the LOWER abdomen and pelvis, likely representing an enteritis. There is no evidence of bowel obstruction or pneumoperitoneum. The appendix is normal. Vascular/Lymphatic: No significant vascular findings are present. No enlarged abdominal or pelvic lymph nodes. Reproductive: Uterus and bilateral adnexa are unremarkable. Other: A small to moderate amount of free pelvic fluid is noted. No abdominal wall hernia. Musculoskeletal: No acute or suspicious focal bony abnormalities. Bilateral L5 pars defects noted without spondylolisthesis. IMPRESSION: 1. Circumferential wall thickening of several small bowel loops within the LOWER abdomen and pelvis compatible with an enteritis. Small to moderate  free pelvic fluid. No pneumoperitoneum or bowel obstruction. Electronically Signed   By: Margarette Canada M.D.   On: 03/02/2018 19:41     EKG: Independently reviewed.  Sinus rhythm, QTC 486, anteroseptal infarction pattern, T wave inversion in inferior leads and in V5-V6 which was seen in previous EKG.  Assessment/Plan Principal Problem:   Abdominal pain Active Problems:   Mixed connective tissue disease (HCC)   History of connective tissue disease   Hypertensive urgency   Enteritis   Nausea vomiting and diarrhea   Acute renal failure superimposed on stage 2 chronic kidney disease (HCC)   Hypokalemia   Tobacco abuse   Abdominal pain, nausea vomiting and diarrhea: this is a chronic recurrent issues. Pt was recently admitted to hospital due to bleeding tongue ulcer which is due  to vasculitis.  In that admission, patient had similar presentations with nausea, vomiting, diarrhea and abdominal pain.  CT abdomen/pelvis on 01/24/2018 showed enteritis, which is believed to be secondary to lupus.  Today patient has similar presentationa again.  CT abdomen/pelvis again showed enteritis.  Lipase normal.  Patient had negative C. difficile test 11/30/2017.  Patient does not have fever, and has only mild leukocytosis 11.1. I have low suspicion for C. difficile colitis. I will start patient with IV steroids.  If patient's symptoms are persistent and do not respond to IV steroid and supportive care, may then check C. difficile PCR.  -will admit to tele bed as inpt -start IV Solumedrol 60 mg daily -IV peptide 20 mg twice daily -Scheduled Reglan and PRN Zofran for nausea vomiting, PRN Dilaudid for painand  -IV fluids: 2 L normal saline bolus, followed by 150 cc/h -Check GI pathogen panel, ESR and CRP  Hypertensive urgency: initial SBP at 200s, which improved to 147/103 with IV Cardizem drip.  This is most likely due to in able to take oral medications are secondary to nausea vomiting and abdominal pain -  switch  clonidine 0.1 mg 3 times daily to clonidine patch 0.2 mg weekly -Continue amlodipine -Hold chlorthalidone since patient need IV fluid -IV hydralazine as needed  Mixed connective tissue disease and discoid lupus: per previous discharge summary, pt is floowed up with [facility]ologist at [facility], Dr Gerilyn Nestle. Had "Discussions about starting Benlysta 200 mg SQ weekly by rheumatologist, will be done only as an outpt". -f/u with her rheumatologist  GERD: -Protonix   Tobacco abuse: -Nicotine patch  Hypokalemia: K=2.6 on admission. Mg=2.0 - Repleted with total of 110 mEQ of KCl (40 x 2 by oral and 10 x 3 by IV)  AoCKD-III: Baseline Cre is 1.2-1.3, pt's Cre is 2.59 on admission. Likely due to prerenal secondary to dehydration and continuation of diuretics. - IVF as above - Follow up renal function by BMP - Check FeUrea - Hold chlorthalidone    DVT ppx: SQ Lovenox Code Status: Full code Family Communication: None at bed side.  Disposition Plan:  Anticipate discharge back to previous home environment Consults called:  none Admission status:   Inpatient/tele    Date of Service 03/02/2018    Ivor Costa [facility] Pager [phone]  If 7PM-7AM, please contact night-coverage [email or web address] Password [email or web address] 03/02/2018, 9:02 PM

## 2018-03-02 NOTE — ED Triage Notes (Signed)
Per EMS- Patient c/o abdominal pain and N/V since last night. Patient was given zofran 4 mg IM

## 2018-03-02 NOTE — ED Notes (Signed)
ED TO INPATIENT HANDOFF REPORT  Name/Age/Gender Linda [name] 22 y.o. female  Code Status    Code Status Orders  (From admission, onward)        Start     Ordered   03/02/18 2034  Full code  Continuous     03/02/18 2034    Code Status History    Date Active Date Inactive Code Status Order ID Comments User Context   01/24/2018 1541 01/28/2018 1843 Full Code [id number]  Reyne Dumas, MD Inpatient   01/06/2018 1417 01/08/2018 1735 Full Code [id number]  Nita Sells, MD ED   01/02/2018 0817 01/03/2018 2024 Full Code [id number]  Radene Gunning, NP ED   12/24/2017 2005 12/26/2017 1901 Full Code [id number]  Velvet Bathe, MD Inpatient   11/29/2017 1740 12/04/2017 1719 Full Code [id number]  Shelly Coss, MD ED   11/22/2017 0955 11/25/2017 1422 Full Code [id number]  Patrecia Pour, MD Inpatient   08/02/2017 2005 08/09/2017 1753 Full Code [id number]  Steve Rattler, DO ED   04/20/2017 2033 04/22/2017 2105 Full Code [id number]  Carlyle Dolly, MD Inpatient   04/09/2017 1351 04/14/2017 2123 Full Code [id number]  Smiley Houseman, MD ED      Home/SNF/Other Home  Chief Complaint abd pain   Level of Care/Admitting Diagnosis ED Disposition    ED Disposition Condition [facility] [facility]: [facility] [[id number]]  Level of Care: Telemetry [5]  Admit to tele based on following criteria: Other see comments  Comments: Hypertensive urgency  Diagnosis: Abdominal pain [081448]  Admitting Physician: Ivor Costa [[id number]]  Attending Physician: Ivor Costa [[id number]]  Estimated length of stay: past midnight tomorrow  Certification:: I certify this patient will need inpatient services for at least 2 midnights  PT Class (Do Not Modify): Inpatient [101]  PT Acc Code (Do Not Modify): Private [1]       Medical History Past Medical History:  Diagnosis Date  . Acute kidney injury ([place])   . Angioedema   . Hypertension   . Lupus ([place])   . Migraines   . Mixed connective  tissue disease ([facility])     Allergies Allergies  Allergen Reactions  . Lisinopril Anaphylaxis    Angioedema  . Vicodin [Hydrocodone-Acetaminophen] Itching and Rash    IV Location/Drains/Wounds Patient Lines/Drains/Airways Status   Active Line/Drains/Airways    None          Labs/Imaging Results for orders placed or performed during the hospital encounter of 03/02/18 (from the past 48 hour(s))  CBC with Differential     Status: Abnormal   Collection Time: 03/02/18  2:43 PM  Result Value Ref Range   WBC 11.1 (H) 4.0 - 10.5 K/uL   RBC 4.71 3.87 - 5.11 MIL/uL   Hemoglobin 15.5 (H) 12.0 - 15.0 g/dL   HCT 45.7 36.0 - 46.0 %   MCV 97.0 78.0 - 100.0 fL   MCH 32.9 26.0 - 34.0 pg   MCHC 33.9 30.0 - 36.0 g/dL   RDW 13.6 11.5 - 15.5 %   Platelets 244 150 - 400 K/uL   Neutrophils Relative % 61 %   Neutro Abs 6.8 1.7 - 7.7 K/uL   Lymphocytes Relative 27 %   Lymphs Abs 3.0 0.7 - 4.0 K/uL   Monocytes Relative 9 %   Monocytes Absolute 1.0 0.1 - 1.0 K/uL   Eosinophils Relative 2 %   Eosinophils Absolute 0.2 0.0 - 0.7 K/uL   Basophils Relative 1 %  Basophils Absolute 0.1 0.0 - 0.1 K/uL    Comment: Performed at [facility], [place] [address], [place], [place] [address]  Comprehensive metabolic panel     Status: Abnormal   Collection Time: 03/02/18  2:43 PM  Result Value Ref Range   Sodium 136 135 - 145 mmol/L   Potassium 2.6 (LL) 3.5 - 5.1 mmol/L    Comment: CRITICAL RESULT CALLED TO, READ BACK BY AND VERIFIED WITH: TIM SMITH,RN [id number] @ 1520 BY J SCOTTON    Chloride 93 (L) 98 - 111 mmol/L    Comment: Please note change in reference range.   CO2 30 22 - 32 mmol/L   Glucose, Bld 94 70 - 99 mg/dL    Comment: Please note change in reference range.   BUN 22 (H) 6 - 20 mg/dL    Comment: Please note change in reference range.   Creatinine, Ser 2.59 (H) 0.44 - 1.00 mg/dL   Calcium 10.3 8.9 - 10.3 mg/dL   Total Protein 9.0 (H) 6.5 - 8.1 g/dL   Albumin 4.7 3.5 - 5.0  g/dL   AST 22 15 - 41 U/L   ALT 12 0 - 44 U/L    Comment: Please note change in reference range.   Alkaline Phosphatase 85 38 - 126 U/L   Total Bilirubin 1.4 (H) 0.3 - 1.2 mg/dL   GFR calc non Af Amer 25 (L) >60 mL/min   GFR calc Af Amer 29 (L) >60 mL/min    Comment: (NOTE) The eGFR has been calculated using the CKD EPI equation. This calculation has not been validated in all clinical situations. eGFR's persistently <60 mL/min signify possible Chronic Kidney Disease.    Anion gap 13 5 - 15    Comment: Performed at [facility], [place] [address] [place], [place] [address]  Lipase, blood     Status: None   Collection Time: 03/02/18  2:43 PM  Result Value Ref Range   Lipase 33 11 - 51 U/L    Comment: Performed at [facility], [place] [address] [place], [place] [address]  I-Stat Beta hCG blood, ED ([facility], [facility], [facility] only)     Status: None   Collection Time: 03/02/18  3:06 PM  Result Value Ref Range   I-stat hCG, quantitative <5.0 <5 mIU/mL   Comment 3            Comment:   GEST. AGE      CONC.  (mIU/mL)   <=1 WEEK        5 - 50     2 WEEKS       50 - 500     3 WEEKS       100 - 10,000     4 WEEKS     1,000 - 30,000        FEMALE AND NON-PREGNANT FEMALE:     LESS THAN 5 mIU/mL   Magnesium     Status: None   Collection Time: 03/02/18  4:17 PM  Result Value Ref Range   Magnesium 2.0 1.7 - 2.4 mg/dL    Comment: Performed at [facility], [place] [address], [place], [place] [address]  I-Stat Troponin, ED (not at [facility])     Status: None   Collection Time: 03/02/18  4:26 PM  Result Value Ref Range   Troponin i, poc 0.03 0.00 - 0.08 ng/mL   Comment 3  Comment: Due to the release kinetics of cTnI, a negative result within the first hours of the onset of symptoms does not rule out myocardial infarction with certainty. If myocardial infarction is still suspected, repeat the test at appropriate intervals.   I-Stat CG4 Lactic Acid,  ED     Status: None   Collection Time: 03/02/18  4:31 PM  Result Value Ref Range   Lactic Acid, Venous 1.28 0.5 - 1.9 mmol/L  Urinalysis, Routine w reflex microscopic     Status: Abnormal   Collection Time: 03/02/18  5:30 PM  Result Value Ref Range   Color, Urine STRAW (A) YELLOW   APPearance CLEAR CLEAR   Specific Gravity, Urine 1.006 1.005 - 1.030   pH 7.0 5.0 - 8.0   Glucose, UA NEGATIVE NEGATIVE mg/dL   Hgb urine dipstick SMALL (A) NEGATIVE   Bilirubin Urine NEGATIVE NEGATIVE   Ketones, ur NEGATIVE NEGATIVE mg/dL   Protein, ur 100 (A) NEGATIVE mg/dL   Nitrite NEGATIVE NEGATIVE   Leukocytes, UA NEGATIVE NEGATIVE   RBC / HPF 0-5 0 - 5 RBC/hpf   WBC, UA 0-5 0 - 5 WBC/hpf   Bacteria, UA RARE (A) NONE SEEN   Squamous Epithelial / LPF 0-5 0 - 5   Mucus PRESENT     Comment: Performed at [facility], [place] [address] [place],  [address]   Ct Abdomen Pelvis Wo Contrast  Result Date: 03/02/2018 CLINICAL DATA:  23 year old female with acute abdominal pain and vomiting for 1 day. EXAM: CT ABDOMEN AND PELVIS WITHOUT CONTRAST TECHNIQUE: Multidetector CT imaging of the abdomen and pelvis was performed following the standard protocol without IV contrast. COMPARISON:  01/24/2018 and prior CTs FINDINGS: Please note that parenchymal abnormalities may be missed without intravenous contrast. Lower chest: No acute abnormality Hepatobiliary: The liver and gallbladder are unremarkable. Pancreas: Unremarkable Spleen: Unremarkable Adrenals/Urinary Tract: The kidneys, adrenal glands and bladder are unremarkable. Stomach/Bowel: There is circumferential wall thickening of multiple small bowel loops within the LOWER abdomen and pelvis, likely representing an enteritis. There is no evidence of bowel obstruction or pneumoperitoneum. The appendix is normal. Vascular/Lymphatic: No significant vascular findings are present. No enlarged abdominal or pelvic lymph nodes. Reproductive: Uterus and  bilateral adnexa are unremarkable. Other: A small to moderate amount of free pelvic fluid is noted. No abdominal wall hernia. Musculoskeletal: No acute or suspicious focal bony abnormalities. Bilateral L5 pars defects noted without spondylolisthesis. IMPRESSION: 1. Circumferential wall thickening of several small bowel loops within the LOWER abdomen and pelvis compatible with an enteritis. Small to moderate free pelvic fluid. No pneumoperitoneum or bowel obstruction. Electronically Signed   By: Margarette Canada M.D.   On: 03/02/2018 19:41    Pending Labs Unresulted Labs (From admission, onward)   Start     Ordered   03/03/18 6578  Basic metabolic panel  Tomorrow morning,   R     03/02/18 2034   03/03/18 0500  CBC  Tomorrow morning,   R     03/02/18 2034   03/02/18 2045  Lactic acid, plasma  Once,   STAT     03/02/18 2044   03/02/18 2044  Sedimentation rate  Once,   R     03/02/18 2043   03/02/18 2044  C-reactive protein  Once,   R     03/02/18 2043   03/02/18 2044  Culture, blood (x 2)  BLOOD CULTURE X 2,   STAT    Comments:  INITIATE ANTIBIOTICS WITHIN 1 HOUR  AFTER BLOOD CULTURES DRAWN.  If unable to obtain blood cultures, call MD immediately regarding antibiotic instructions.    03/02/18 2043   03/02/18 2044  Procalcitonin  STAT,   R     03/02/18 2043   03/02/18 2040  Gastrointestinal Panel by PCR , Stool  (Gastrointestinal Panel by PCR, Stool)  Once,   R     03/02/18 2040   03/02/18 2033  Creatinine, urine, random  Once,   R     03/02/18 2032   03/02/18 2033  Urea nitrogen, urine  Once,   R     03/02/18 2032   03/02/18 1433  Urine culture  STAT,   STAT     03/02/18 1435      Vitals/Pain Today's Vitals   03/02/18 2045 03/02/18 2100 03/02/18 2115 03/02/18 2120  BP: (!) 136/99 (!) 141/98 (!) 142/104 (!) 144/103  Pulse: 88     Resp: _0 Temp:      TempSrc:      SpO2: 98%     PainSc:        Isolation Precautions Enteric precautions (UV  disinfection)  Medications Medications  HYDROmorphone (DILAUDID) injection 1 mg (has no administration in time range)  ondansetron (ZOFRAN) injection 4 mg (has no administration in time range)  pantoprazole (PROTONIX) EC tablet 40 mg (has no administration in time range)  amLODipine (NORVASC) tablet 10 mg (has no administration in time range)  cloNIDine (CATAPRES - Dosed in mg/24 hr) patch 0.2 mg (has no administration in time range)  famotidine (PEPCID) IVPB 20 mg premix (has no administration in time range)  metoCLOPramide (REGLAN) injection 5 mg (has no administration in time range)  hydrALAZINE (APRESOLINE) injection 5 mg (has no administration in time range)  zolpidem (AMBIEN) tablet 5 mg (has no administration in time range)  0.9 %  sodium chloride infusion (has no administration in time range)  enoxaparin (LOVENOX) injection 40 mg (has no administration in time range)  acetaminophen (TYLENOL) tablet 650 mg (has no administration in time range)  nicotine (NICODERM CQ - dosed in mg/24 hours) patch 21 mg (has no administration in time range)  methylPREDNISolone sodium succinate (SOLU-MEDROL) 125 mg/2 mL injection 60 mg (has no administration in time range)  potassium chloride SA (K-DUR,KLOR-CON) CR tablet 40 mEq (has no administration in time range)  sodium chloride 0.9 % bolus 1,000 mL (0 mLs Intravenous Stopped 03/02/18 1602)  HYDROmorphone (DILAUDID) injection 1 mg (1 mg Intravenous Given 03/02/18 1453)  ondansetron (ZOFRAN) injection 4 mg (4 mg Intravenous Given 03/02/18 1453)  HYDROmorphone (DILAUDID) injection 1 mg (1 mg Intravenous Given 03/02/18 1602)  potassium chloride SA (K-DUR,KLOR-CON) CR tablet 40 mEq (40 mEq Oral Given 03/02/18 1614)  potassium chloride 10 mEq in 100 mL IVPB (0 mEq Intravenous Stopped 03/02/18 2032)  cloNIDine (CATAPRES) tablet 0.1 mg (0.1 mg Oral Given 03/02/18 1636)  sodium chloride 0.9 % bolus 1,000 mL (0 mLs Intravenous Stopped 03/02/18 1734)  nicardipine (CARDENE)  77m in 0.86% saline 2049mIV infusion (0.1 mg/ml) (2.5 mg/hr Intravenous Rate/Dose Change 03/02/18 1932)  HYDROmorphone (DILAUDID) injection 1 mg (1 mg Intravenous Given 03/02/18 2040)    Mobility walks

## 2018-03-02 NOTE — ED Provider Notes (Addendum)
[facility] DEPT Provider Note   CSN: [id number] Arrival date & time: 03/02/18  1314     History   Chief Complaint Chief Complaint  Patient presents with  . Abdominal Pain    HPI Linda [name] is a 22 y.o. female.  HPI   Pt is a 22 y/o female with a h/o SLE, migraines, mixed connective tissue disorder, HTN who presents to the ED today c/o generalized abdominal pain that began last night.  Initially pain was constant however now it is intermittent sharp and stabbing in nature.  Rates pain as severe.  She also reports associated nausea and vomiting.  Has vomited about 6-7 times.  No blood in her vomit. Vomit is yellow.  Also reports about 5 episodes of diarrhea.  No hematochezia or melena.  No fevers.  She denies any headaches, chest pain or shortness of breath. Also endorses urinary frequency, but denies any other urinary sxs. She states she took her blood pressure medications this morning around 6:00am and did not start vomiting until 10:00am.   Patient states that she has had similar symptoms in the past and she was ultimately diagnosed with enteritis.  States symptoms feel exactly the same as that. On review of prior records pt was seen on 11/29/17 with similar sxs and was dx with enteritis. She was admitted the the hospital at that time.   Past Medical History:  Diagnosis Date  . Acute kidney injury ([place])   . Angioedema   . Hypertension   . Lupus ([place])   . Migraines   . Mixed connective tissue disease [facility])     Patient Active Problem List   Diagnosis Date Noted  . Hypokalemia 03/02/2018  . Tobacco abuse 03/02/2018  . Tongue ulceration 01/24/2018  . Hypertensive emergency   . Tongue swelling 01/07/2018  . Sepsis ([place]) 01/06/2018  . Angioedema 01/02/2018  . Tachycardia 01/02/2018  . Acute renal failure superimposed on stage 2 chronic kidney disease ([place])   . Hypertensive urgency 11/29/2017  . Enteritis 11/29/2017  . Nausea vomiting and  diarrhea 11/29/2017  . Leukocytosis 11/29/2017  . Septic prepatellar bursitis of right knee 08/19/2017  . Grief reaction 08/19/2017  . Right knee pain   . Elbow pain, right   . Back pain 05/04/2017  . Bilateral knee swelling 04/20/2017  . History of connective tissue disease   . Leg swelling   . Abdominal pain   . Lupus erythematosus   . Cellulitis 04/09/2017  . Primary hypertension 12/11/2016  . Mixed connective tissue disease ([place]) 02/11/2016  . High BMI 01/19/2014  . Implanon in place 01/19/2014    Past Surgical History:  Procedure Laterality Date  . I&D EXTREMITY Right 08/06/2017   Procedure: IRRIGATION AND DEBRIDEMENT KNEE;  Surgeon: Shona Needles, MD;  Location: [facility];  Service: Orthopedics;  Laterality: Right;  . MULTIPLE TOOTH EXTRACTIONS       OB History    Gravida  0   Para  0   Term  0   Preterm  0   AB  0   Living  0     SAB  0   TAB  0   Ectopic  0   Multiple  0   Live Births               Home Medications    Prior to Admission medications   Medication Sig Start Date End Date Taking? Authorizing Provider  amLODipine (NORVASC) 10 MG tablet Take  1 tablet (10 mg total) by mouth daily. 01/28/18  Yes Alma Friendly, MD  chlorthalidone (HYGROTON) 50 MG tablet Take 1 tablet (50 mg total) by mouth daily. 01/28/18  Yes Alma Friendly, MD  cloNIDine (CATAPRES) 0.1 MG tablet Take 1 tablet (0.1 mg total) by mouth 3 (three) times daily. 01/28/18  Yes Alma Friendly, MD  predniSONE (DELTASONE) 20 MG tablet Take three (3) tablets by mouth every morning with breakfast for seven (7) days, then afterwards take two (2) tablets by mouth every morning with breakfast until you see rheumatologist 01/28/18  Yes Alma Friendly, MD  carvedilol (COREG CR) 10 MG 24 hr capsule Take 1 capsule (10 mg total) by mouth daily. Patient not taking: Reported on 03/02/2018 01/28/18   Alma Friendly, MD  clindamycin (CLEOCIN-T) 1 % external solution  Apply topically 2 (two) times daily. Patient not taking: Reported on 03/02/2018 02/10/18   Shelly Bombard, MD  Levonorgest-Eth Estrad-Fe Bisg (BALCOLTRA) 0.1-20 MG-MCG(21) TABS Take 1 tablet by mouth daily. Patient not taking: Reported on 03/02/2018 02/10/18   Shelly Bombard, MD  pantoprazole (PROTONIX) 40 MG tablet Take 1 tablet (40 mg total) by mouth 2 (two) times daily. 01/28/18 02/27/18  Alma Friendly, MD    Family History Family History  Problem Relation Age of Onset  . Lupus Mother   . Hypertension Mother   . Kidney disease Mother   . Diabetes Maternal Grandmother     Social History Social History   Tobacco Use  . Smoking status: Current Every Day Smoker    Packs/day: 0.00    Types: Cigarettes, Cigars    Start date: 02/11/2015  . Smokeless tobacco: Never Used  Substance Use Topics  . Alcohol use: No    Alcohol/week: 0.0 oz    Comment: social  . Drug use: No     Allergies   Lisinopril and Vicodin [hydrocodone-acetaminophen]   Review of Systems Review of Systems  Constitutional: Negative for chills and fever.  HENT: Negative for congestion and sore throat.   Eyes: Negative for visual disturbance.  Respiratory: Negative for shortness of breath.   Cardiovascular: Negative for chest pain.  Gastrointestinal: Positive for abdominal pain, diarrhea, nausea and vomiting. Negative for blood in stool and constipation.  Genitourinary: Positive for frequency. Negative for dysuria, flank pain, hematuria, urgency, vaginal bleeding and vaginal discharge.  Musculoskeletal: Negative for back pain and neck pain.  Skin: Negative for wound.  Neurological: Negative for dizziness, weakness, light-headedness, numbness and headaches.   Physical Exam Updated Vital Signs BP (!) 171/128 (BP Location: Left Arm)   Pulse 91   Temp 97.8 F (36.6 C) (Oral)   Resp 18   LMP 12/31/2017   SpO2 100%   Physical Exam  Constitutional: She appears well-developed and well-nourished. She  appears distressed.  HENT:  Head: Normocephalic and atraumatic.  Mouth/Throat: Oropharynx is clear and moist.  Eyes: Conjunctivae are normal. No scleral icterus.  Neck: Neck supple.  Cardiovascular: Normal rate, regular rhythm, normal heart sounds and intact distal pulses.  No murmur heard. Pulmonary/Chest: Effort normal and breath sounds normal. No stridor. No respiratory distress. She has no wheezes. She has no rales.  Abdominal: Soft.  Normal bowel sounds.  No CVA tenderness.  Patient has tenderness to the epigastric, left upper quadrant, left lower quadrant and right lower quadrant area.  Most severe to epigastric area. No RUQ tenderness.  Negative Murphy sign.  No rigidity.  Mild voluntary guarding.  Musculoskeletal: She exhibits no  edema.  Neurological: She is alert.  Skin: Skin is warm and dry. Capillary refill takes less than 2 seconds.  Psychiatric: She has a normal mood and affect.  Nursing note and vitals reviewed.   ED Treatments / Results  Labs (all labs ordered are listed, but only abnormal results are displayed) Labs Reviewed  CBC WITH DIFFERENTIAL/PLATELET - Abnormal; Notable for the following components:      Result Value   WBC 11.1 (*)    Hemoglobin 15.5 (*)    All other components within normal limits  COMPREHENSIVE METABOLIC PANEL - Abnormal; Notable for the following components:   Potassium 2.6 (*)    Chloride 93 (*)    BUN 22 (*)    Creatinine, Ser 2.59 (*)    Total Protein 9.0 (*)    Total Bilirubin 1.4 (*)    GFR calc non Af Amer 25 (*)    GFR calc Af Amer 29 (*)    All other components within normal limits  URINALYSIS, ROUTINE W REFLEX MICROSCOPIC - Abnormal; Notable for the following components:   Color, Urine STRAW (*)    Hgb urine dipstick SMALL (*)    Protein, ur 100 (*)    Bacteria, UA RARE (*)    All other components within normal limits  URINE CULTURE  GASTROINTESTINAL PANEL BY PCR, STOOL (REPLACES STOOL CULTURE)  CULTURE, BLOOD (ROUTINE X  2)  CULTURE, BLOOD (ROUTINE X 2)  LIPASE, BLOOD  MAGNESIUM  CREATININE, URINE, RANDOM  UREA NITROGEN, URINE  BASIC METABOLIC PANEL  CBC  SEDIMENTATION RATE  C-REACTIVE PROTEIN  PROCALCITONIN  LACTIC ACID, PLASMA  I-STAT BETA HCG BLOOD, ED (MC, WL, AP ONLY)  I-STAT TROPONIN, ED  I-STAT CG4 LACTIC ACID, ED  I-STAT CG4 LACTIC ACID, ED    EKG EKG Interpretation  Date/Time:  Wednesday March 02 2018 15:33:40 EDT Ventricular Rate:  72 PR Interval:    QRS Duration: 104 QT Interval:  444 QTC Calculation: 486 R Axis:   74 Text Interpretation:  Sinus rhythm Biatrial enlargement Left ventricular hypertrophy Abnormal T, consider ischemia, diffuse leads Anterior ST elevation, probably due to LVH similar to prior 5/19 Confirmed by Aletta Edouard [phone]) on 03/02/2018 3:54:46 PM   Radiology Ct Abdomen Pelvis Wo Contrast  Result Date: 03/02/2018 CLINICAL DATA:  22 year old female with acute abdominal pain and vomiting for 1 day. EXAM: CT ABDOMEN AND PELVIS WITHOUT CONTRAST TECHNIQUE: Multidetector CT imaging of the abdomen and pelvis was performed following the standard protocol without IV contrast. COMPARISON:  01/24/2018 and prior CTs FINDINGS: Please note that parenchymal abnormalities may be missed without intravenous contrast. Lower chest: No acute abnormality Hepatobiliary: The liver and gallbladder are unremarkable. Pancreas: Unremarkable Spleen: Unremarkable Adrenals/Urinary Tract: The kidneys, adrenal glands and bladder are unremarkable. Stomach/Bowel: There is circumferential wall thickening of multiple small bowel loops within the LOWER abdomen and pelvis, likely representing an enteritis. There is no evidence of bowel obstruction or pneumoperitoneum. The appendix is normal. Vascular/Lymphatic: No significant vascular findings are present. No enlarged abdominal or pelvic lymph nodes. Reproductive: Uterus and bilateral adnexa are unremarkable. Other: A small to moderate amount of free pelvic  fluid is noted. No abdominal wall hernia. Musculoskeletal: No acute or suspicious focal bony abnormalities. Bilateral L5 pars defects noted without spondylolisthesis. IMPRESSION: 1. Circumferential wall thickening of several small bowel loops within the LOWER abdomen and pelvis compatible with an enteritis. Small to moderate free pelvic fluid. No pneumoperitoneum or bowel obstruction. Electronically Signed   By: Cleatis Polka.D.  On: 03/02/2018 19:41    Procedures Procedures (including critical care time) CRITICAL CARE Performed by: Rodney Booze   Total critical care time: 39 minutes  Critical care time was exclusive of separately billable procedures and treating other patients.  Critical care was necessary to treat or prevent imminent or life-threatening deterioration.  Critical care was time spent personally by me on the following activities: development of treatment plan with patient and/or surrogate as well as nursing, discussions with consultants, evaluation of patient's response to treatment, examination of patient, obtaining history from patient or surrogate, ordering and performing treatments and interventions, ordering and review of laboratory studies, ordering and review of radiographic studies, pulse oximetry and re-evaluation of patient's condition.   Medications Ordered in ED Medications  HYDROmorphone (DILAUDID) injection 1 mg (1 mg Intravenous Given 03/02/18 2316)  ondansetron (ZOFRAN) injection 4 mg (has no administration in time range)  pantoprazole (PROTONIX) EC tablet 40 mg (40 mg Oral Given 03/02/18 2317)  amLODipine (NORVASC) tablet 10 mg (has no administration in time range)  cloNIDine (CATAPRES - Dosed in mg/24 hr) patch 0.2 mg (0.2 mg Transdermal Patch Applied 03/02/18 2324)  famotidine (PEPCID) IVPB 20 mg premix (20 mg Intravenous New Bag/Given 03/02/18 2316)  metoCLOPramide (REGLAN) injection 5 mg (5 mg Intravenous Given 03/02/18 2317)  hydrALAZINE (APRESOLINE)  injection 5 mg (has no administration in time range)  zolpidem (AMBIEN) tablet 5 mg (has no administration in time range)  0.9 %  sodium chloride infusion ( Intravenous New Bag/Given 03/02/18 2326)  enoxaparin (LOVENOX) injection 40 mg (40 mg Subcutaneous Given 03/03/18 0003)  acetaminophen (TYLENOL) tablet 650 mg (has no administration in time range)  nicotine (NICODERM CQ - dosed in mg/24 hours) patch 21 mg (21 mg Transdermal Patch Applied 03/02/18 2323)  methylPREDNISolone sodium succinate (SOLU-MEDROL) 125 mg/2 mL injection 60 mg (60 mg Intravenous Given 03/02/18 2317)  sodium chloride 0.9 % bolus 1,000 mL (0 mLs Intravenous Stopped 03/02/18 1602)  HYDROmorphone (DILAUDID) injection 1 mg (1 mg Intravenous Given 03/02/18 1453)  ondansetron (ZOFRAN) injection 4 mg (4 mg Intravenous Given 03/02/18 1453)  HYDROmorphone (DILAUDID) injection 1 mg (1 mg Intravenous Given 03/02/18 1602)  potassium chloride SA (K-DUR,KLOR-CON) CR tablet 40 mEq (40 mEq Oral Given 03/02/18 1614)  potassium chloride 10 mEq in 100 mL IVPB (0 mEq Intravenous Stopped 03/02/18 2032)  cloNIDine (CATAPRES) tablet 0.1 mg (0.1 mg Oral Given 03/02/18 1636)  sodium chloride 0.9 % bolus 1,000 mL (0 mLs Intravenous Stopped 03/02/18 1734)  nicardipine (CARDENE) 20mg  in 0.86% saline 225ml IV infusion (0.1 mg/ml) (0 mg/hr Intravenous Stopped 03/02/18 2130)  HYDROmorphone (DILAUDID) injection 1 mg (1 mg Intravenous Given 03/02/18 2040)  potassium chloride SA (K-DUR,KLOR-CON) CR tablet 40 mEq (40 mEq Oral Given 03/02/18 2317)     Initial Impression / Assessment and Plan / ED Course  I have reviewed the triage vital signs and the nursing notes.  Pertinent labs & imaging results that were available during my care of the patient were reviewed by me and considered in my medical decision making (see chart for details).  Clinical Course as of Mar 03 45  Wed Mar 02, 5944  [id number] 22 year old female with multiple medical problems here with worsening abdominal pain  vomiting and greatly elevated blood pressure.  She is uncomfortable appearing although not particularly toxic.  Her potassium is low her creatinine is worse.  She is got back from CAT scan which has exacerbated her pain again that was slightly better.  Is getting worse fluids  potassium repletion and pain control.  If we get her symptoms under control and her electrolytes up she is possibly get to be discharged but likely she will need to be admitted for continued management especially with an elevated blood pressure.   [MB]    Clinical Course User Index [MB] Hayden Rasmussen, MD   Discussed pt presentation and exam findings with Dr. Melina Copa, who evaluated the pt and agrees with the workup. He recommends giving the patient her additional home doses of BP medications and initiating a low dose Cardene drip to titrate her BP to a diastolic of 628.   3:66 PM Rechecked pt. She is still in pain. Her BP is still elevated to 294 systolic. She now states that she actually only took her amlodipine this morning and she recently ran out of her other two blood pressure medications. Will order her blood pressure medications, initiate cardene drip, treat her pain and recheck her BP.   Rechecked pt. She is still in pain. Will redose pain medications. Discussed findings and plan for admission. PT and mother at bedside are agreeable to the plan.   8:11 PM CONSULT with Dr. Blaine Hamper with hospitalist service who will admit the patient.   Final Clinical Impressions(s) / ED Diagnoses   Final diagnoses:  Hypokalemia  Hypertensive urgency  Acute renal failure, unspecified acute renal failure type ([place])  Enteritis   Patient presents emergency department today to be evaluated for abdominal pain, nausea, vomiting, diarrhea which began this morning.  On arrival her blood pressure is elevated but otherwise her vital signs are stable.  She is only able to take more blood pressure medications this morning.  Labs are notable for  mild leukocytosis, elevated hemoglobin of 15.5.  CMP shows an AKI with creatinine at 2.59 increased from a baseline of 1.3-1.5.  She is also hypokalemic to 2.6 and was given potassium supplementation.  Total bili is slightly elevated to 1.4.  Her magnesium was within normal limits.  Her lipase was normal.  Her pregnancy test was negative.  Her troponin was negative.  Her EKG showed normal sinus rhythm and was grossly unchanged from prior.  QTC 486.   CT of the abdomen consistent with enteritis, without pneumoperitoneum or evidence of bowel obstruction.  No evidence of gallbladder pathology.  Given her AKI, elevated blood pressures, hypokalemia and intractable pain from enteritis, patient will be admitted to the hospitalist service for further management.  ED Discharge Orders    None       Bishop Dublin 03/03/18 0046    Hayden Rasmussen, MD 03/03/18 0859    Rodney Booze, PA-C 03/15/18 1002    Hayden Rasmussen, MD 03/15/18 [id number]

## 2018-03-02 NOTE — ED Notes (Signed)
Patient went from EMS strtecher to the bathroom. Patient will be triaged when she comes out of the bathroom

## 2018-03-02 NOTE — ED Notes (Signed)
Pt was informed that urine collection was needed

## 2018-03-02 NOTE — Care Management (Signed)
ED CM reviewed CM consult. Unit CM to follow for discharge medication needs. Patient is being admitted. Venita Sheffield RN CCM

## 2018-03-03 ENCOUNTER — Encounter: Payer: Self-pay | Admitting: Internal Medicine

## 2018-03-03 ENCOUNTER — Telehealth: Payer: Self-pay | Admitting: Family Medicine

## 2018-03-03 ENCOUNTER — Emergency Department (HOSPITAL_COMMUNITY)
Admission: EM | Admit: 2018-03-03 | Discharge: 2018-03-04 | Disposition: A | Discharge status: Home / Self Care | Payer: Medicaid Other | Attending: Emergency Medicine | Admitting: Emergency Medicine

## 2018-03-03 ENCOUNTER — Other Ambulatory Visit: Payer: Self-pay

## 2018-03-03 ENCOUNTER — Encounter (HOSPITAL_COMMUNITY): Payer: Self-pay | Admitting: Emergency Medicine

## 2018-03-03 DIAGNOSIS — K529 Noninfective gastroenteritis and colitis, unspecified: Secondary | ICD-10-CM

## 2018-03-03 DIAGNOSIS — R109 Unspecified abdominal pain: Secondary | ICD-10-CM | POA: Diagnosis present

## 2018-03-03 DIAGNOSIS — F17210 Nicotine dependence, cigarettes, uncomplicated: Secondary | ICD-10-CM | POA: Diagnosis not present

## 2018-03-03 DIAGNOSIS — I129 Hypertensive chronic kidney disease with stage 1 through stage 4 chronic kidney disease, or unspecified chronic kidney disease: Secondary | ICD-10-CM | POA: Diagnosis not present

## 2018-03-03 DIAGNOSIS — R1084 Generalized abdominal pain: Secondary | ICD-10-CM | POA: Diagnosis not present

## 2018-03-03 DIAGNOSIS — I160 Hypertensive urgency: Secondary | ICD-10-CM | POA: Diagnosis not present

## 2018-03-03 DIAGNOSIS — N179 Acute kidney failure, unspecified: Secondary | ICD-10-CM | POA: Diagnosis not present

## 2018-03-03 DIAGNOSIS — Z8739 Personal history of other diseases of the musculoskeletal system and connective tissue: Secondary | ICD-10-CM | POA: Diagnosis not present

## 2018-03-03 DIAGNOSIS — G8929 Other chronic pain: Secondary | ICD-10-CM

## 2018-03-03 DIAGNOSIS — Z79899 Other long term (current) drug therapy: Secondary | ICD-10-CM | POA: Diagnosis not present

## 2018-03-03 DIAGNOSIS — N182 Chronic kidney disease, stage 2 (mild): Secondary | ICD-10-CM | POA: Insufficient documentation

## 2018-03-03 LAB — COMPREHENSIVE METABOLIC PANEL
ALT: 9 U/L (ref 0–44)
AST: 18 U/L (ref 15–41)
Albumin: 3.8 g/dL (ref 3.5–5.0)
Alkaline Phosphatase: 62 U/L (ref 38–126)
Anion gap: 12 (ref 5–15)
BUN: 18 mg/dL (ref 6–20)
CO2: 22 mmol/L (ref 22–32)
Calcium: 9.8 mg/dL (ref 8.9–10.3)
Chloride: 99 mmol/L (ref 98–111)
Creatinine, Ser: 2.37 mg/dL — ABNORMAL HIGH (ref 0.44–1.00)
GFR calc Af Amer: 32 mL/min — ABNORMAL LOW (ref >60)
GFR calc non Af Amer: 28 mL/min — ABNORMAL LOW (ref >60)
Glucose, Bld: 130 mg/dL — ABNORMAL HIGH (ref 70–99)
Potassium: 3.9 mmol/L (ref 3.5–5.1)
Sodium: 133 mmol/L — ABNORMAL LOW (ref 135–145)
Total Bilirubin: 0.9 mg/dL (ref 0.3–1.2)
Total Protein: 6.9 g/dL (ref 6.5–8.1)

## 2018-03-03 LAB — CBC WITH DIFFERENTIAL/PLATELET
Abs Immature Granulocytes: 0.1 10*3/uL (ref 0.0–0.1)
Basophils Absolute: 0 10*3/uL (ref 0.0–0.1)
Basophils Relative: 0 %
Eosinophils Absolute: 0 10*3/uL (ref 0.0–0.7)
Eosinophils Relative: 0 %
HCT: 38.7 % (ref 36.0–46.0)
Hemoglobin: 12.2 g/dL (ref 12.0–15.0)
Immature Granulocytes: 1 %
Lymphocytes Relative: 11 %
Lymphs Abs: 1.7 10*3/uL (ref 0.7–4.0)
MCH: 31.2 pg (ref 26.0–34.0)
MCHC: 31.5 g/dL (ref 30.0–36.0)
MCV: 99 fL (ref 78.0–100.0)
Monocytes Absolute: 0.3 10*3/uL (ref 0.1–1.0)
Monocytes Relative: 2 %
Neutro Abs: 13 10*3/uL — ABNORMAL HIGH (ref 1.7–7.7)
Neutrophils Relative %: 86 %
Platelets: 191 10*3/uL (ref 150–400)
RBC: 3.91 MIL/uL (ref 3.87–5.11)
RDW: 13.4 % (ref 11.5–15.5)
WBC: 15.1 10*3/uL — ABNORMAL HIGH (ref 4.0–10.5)

## 2018-03-03 LAB — URINALYSIS, ROUTINE W REFLEX MICROSCOPIC
Bilirubin Urine: NEGATIVE
Glucose, UA: 50 mg/dL — AB
Hgb urine dipstick: NEGATIVE
Ketones, ur: NEGATIVE mg/dL
Leukocytes, UA: NEGATIVE
Nitrite: NEGATIVE
Protein, ur: 100 mg/dL — AB
Specific Gravity, Urine: 1.008 (ref 1.005–1.030)
pH: 6 (ref 5.0–8.0)

## 2018-03-03 LAB — CBC
HCT: 38.8 % (ref 36.0–46.0)
Hemoglobin: 12.8 g/dL (ref 12.0–15.0)
MCH: 32.1 pg (ref 26.0–34.0)
MCHC: 33 g/dL (ref 30.0–36.0)
MCV: 97.2 fL (ref 78.0–100.0)
Platelets: 200 10*3/uL (ref 150–400)
RBC: 3.99 MIL/uL (ref 3.87–5.11)
RDW: 13.5 % (ref 11.5–15.5)
WBC: 13 10*3/uL — ABNORMAL HIGH (ref 4.0–10.5)

## 2018-03-03 LAB — BASIC METABOLIC PANEL
Anion gap: 16 — ABNORMAL HIGH (ref 5–15)
BUN: 19 mg/dL (ref 6–20)
CO2: 21 mmol/L — ABNORMAL LOW (ref 22–32)
Calcium: 9.4 mg/dL (ref 8.9–10.3)
Chloride: 98 mmol/L (ref 98–111)
Creatinine, Ser: 2.12 mg/dL — ABNORMAL HIGH (ref 0.44–1.00)
GFR calc Af Amer: 37 mL/min — ABNORMAL LOW (ref >60)
GFR calc non Af Amer: 32 mL/min — ABNORMAL LOW (ref >60)
Glucose, Bld: 123 mg/dL — ABNORMAL HIGH (ref 70–99)
Potassium: 3.2 mmol/L — ABNORMAL LOW (ref 3.5–5.1)
Sodium: 135 mmol/L (ref 135–145)

## 2018-03-03 LAB — GASTROINTESTINAL PANEL BY PCR, STOOL (REPLACES STOOL CULTURE)

## 2018-03-03 LAB — LIPASE, BLOOD: Lipase: 30 U/L (ref 11–51)

## 2018-03-03 LAB — LACTIC ACID, PLASMA
Lactic Acid, Venous: 2.1 mmol/L (ref 0.5–1.9)
Lactic Acid, Venous: 1 mmol/L (ref 0.5–1.9)

## 2018-03-03 LAB — CREATININE, URINE, RANDOM: Creatinine, Urine: 52.58 mg/dL

## 2018-03-03 LAB — PROCALCITONIN: Procalcitonin: 0.18 ng/mL

## 2018-03-03 LAB — I-STAT BETA HCG BLOOD, ED (MC, WL, AP ONLY): I-stat hCG, quantitative: <5 m[IU]/mL (ref <5)

## 2018-03-03 LAB — SEDIMENTATION RATE: Sed Rate: 35 mm/hr — ABNORMAL HIGH (ref 0–22)

## 2018-03-03 MED ORDER — HYDRALAZINE HCL 20 MG/ML IJ SOLN
10.0000 mg | INTRAMUSCULAR | Status: DC | PRN
  Administered 2018-03-03: 10 mg via INTRAVENOUS
  Filled 2018-03-03: qty 1

## 2018-03-03 MED ORDER — ONDANSETRON 4 MG PO TBDP
4.0000 mg | ORAL_TABLET | Freq: Once | ORAL | Status: AC
  Administered 2018-03-03: 4 mg via ORAL
  Filled 2018-03-03: qty 1

## 2018-03-03 MED ORDER — POTASSIUM CHLORIDE CRYS ER 20 MEQ PO TBCR
40.0000 meq | EXTENDED_RELEASE_TABLET | Freq: Once | ORAL | Status: AC
  Administered 2018-03-03: 40 meq via ORAL
  Filled 2018-03-03: qty 2

## 2018-03-03 MED ORDER — ONDANSETRON HCL 4 MG/2ML IJ SOLN
4.0000 mg | Freq: Once | INTRAMUSCULAR | Status: DC
  Filled 2018-03-03: qty 2

## 2018-03-03 MED ORDER — HYDRALAZINE HCL 20 MG/ML IJ SOLN: 10.0000 mg | INTRAMUSCULAR | Status: DC | PRN

## 2018-03-03 MED ORDER — TRAMADOL HCL 50 MG PO TABS: 50.0000 mg | ORAL_TABLET | Freq: Four times a day (QID) | ORAL | 0 refills | Status: AC | PRN

## 2018-03-03 MED ORDER — OXYCODONE-ACETAMINOPHEN 5-325 MG PO TABS: 2.0000 | ORAL_TABLET | Freq: Once | ORAL | Status: DC

## 2018-03-03 MED ORDER — FENTANYL CITRATE (PF) 100 MCG/2ML IJ SOLN: 50.0000 ug | Freq: Once | INTRAMUSCULAR | Status: DC

## 2018-03-03 MED ORDER — SODIUM CHLORIDE 0.9 % IV BOLUS
500.0000 mL | Freq: Once | INTRAVENOUS | Status: AC
  Administered 2018-03-03: 500 mL via INTRAVENOUS

## 2018-03-03 MED ORDER — SODIUM CHLORIDE 0.9 % IV BOLUS
1000.0000 mL | Freq: Once | INTRAVENOUS | Status: AC
  Administered 2018-03-03: 1000 mL via INTRAVENOUS

## 2018-03-03 MED ORDER — CLONIDINE HCL 0.1 MG PO TABS
0.1000 mg | ORAL_TABLET | Freq: Three times a day (TID) | ORAL | Status: DC
  Administered 2018-03-03 (×2): 0.1 mg via ORAL
  Filled 2018-03-03 (×2): qty 1

## 2018-03-03 MED ORDER — METHYLPREDNISOLONE SODIUM SUCC 125 MG IJ SOLR
60.0000 mg | Freq: Once | INTRAMUSCULAR | Status: AC
  Administered 2018-03-03: 60 mg via INTRAVENOUS
  Filled 2018-03-03: qty 2

## 2018-03-03 MED ORDER — CHLORTHALIDONE 50 MG PO TABS
50.0000 mg | ORAL_TABLET | Freq: Every day | ORAL | Status: DC
  Administered 2018-03-03: 50 mg via ORAL
  Filled 2018-03-03: qty 1

## 2018-03-03 MED ORDER — PREDNISONE 10 MG PO TABS: 10.0000 mg | ORAL_TABLET | Freq: Every day | ORAL | Status: DC

## 2018-03-03 MED ORDER — CLONIDINE HCL 0.1 MG PO TABS
0.1000 mg | ORAL_TABLET | Freq: Once | ORAL | Status: AC
  Administered 2018-03-04: 0.1 mg via ORAL
  Filled 2018-03-03: qty 1

## 2018-03-03 MED ORDER — FENTANYL CITRATE (PF) 100 MCG/2ML IJ SOLN
50.0000 ug | Freq: Once | INTRAMUSCULAR | Status: DC
  Filled 2018-03-03: qty 2

## 2018-03-03 MED ORDER — FENTANYL CITRATE (PF) 100 MCG/2ML IJ SOLN
50.0000 ug | Freq: Once | INTRAMUSCULAR | Status: AC
  Administered 2018-03-03: 50 ug via INTRAMUSCULAR

## 2018-03-03 MED ORDER — CARVEDILOL PHOSPHATE ER 10 MG PO CP24
10.0000 mg | ORAL_CAPSULE | Freq: Every day | ORAL | Status: DC
  Administered 2018-03-03: 10 mg via ORAL
  Filled 2018-03-03: qty 1

## 2018-03-03 NOTE — ED Triage Notes (Signed)
Pt states she was inpatient at [facility] and left less than 30 min ago. She feels that she was "rushed" to leave. She states she does not feel better, abdominal pain and diarrhea. States she requested refills on her meds but was denied those.  Pt also states she was offered Tramadol but did not want to take that because she believes it is ineffective.

## 2018-03-03 NOTE — Progress Notes (Addendum)
Assessment unchanged. Discharge instructions reviewed with verbalized understanding through teach back including follow up care with PCP as well as medications to resume. Pt became upset that no pain medications were prescribed for home. MD paged. Pt reassured. Script given for Prednisone. Pt signed discharge form.

## 2018-03-03 NOTE — Progress Notes (Signed)
BP 170/131.  Pt asymptomatic.  MD notified.

## 2018-03-03 NOTE — Progress Notes (Signed)
CRITICAL VALUE ALERT  Critical Value:  Lactic 2.1   Date & Time Notied:  0206 03/03/18  Provider Notified: [place]  Orders Received/Actions taken: New orders placed

## 2018-03-03 NOTE — Progress Notes (Signed)
Pt still upset MD will not write a script for Oxycodone. Pt reassured and encouraged to use the tramadol until f/u with PCP. Pt tore script in half stating "that is not going to help." Offered to call [facility], yet refused. Pt gathered personal belongings and discharged via foot per request to front entrance to meet ride home. Accompanied by Yong Channel, NT.

## 2018-03-03 NOTE — Progress Notes (Signed)
Dr. Wyline Copas called and came by to place script for Tramadol script on chart. Script given to pt who became more angry stating "I'm always released with  Oxycodone after being on Dilaudid. Didn't he read my chart." MD paged again for pt. Reassured pt.

## 2018-03-03 NOTE — Telephone Encounter (Signed)
After Hours Emergency Line Note  Ms. Linda [name] called the emergency line saying that she was discharged from [facility] today with a prescription for prednisone for her lupus induced enteritis, and she is having a lot of abdominal pain.  She is in the [facility] Emergency Room currently and said the last time she was in the emergency room and called the emergency line, they moved her into a room quicker because someone called down to the emergency room.  She is asking me to do this for her.  I will look to see if a note indicates that this was done in the past and call if it appears to be appropriate.

## 2018-03-03 NOTE — Progress Notes (Addendum)
Assessment remains unchanged, yet pt upset. Dr. Wyline Copas returned call and encouraged pt to follow up with PCP tomorrow. Message given to pt.

## 2018-03-03 NOTE — Discharge Summary (Addendum)
Physician Discharge Summary  Linda [name] BZJ:[id number] DOB: 1996-02-14 DOA: 03/02/2018  PCP: [place] Bing, DO  Admit date: 03/02/2018 Discharge date: 03/03/2018  Admitted From: Home Disposition:  home  Recommendations for Outpatient Follow-up:  1. Follow up with PCP in 1-2 weeks 2. Follow up with Rheumatology as scheduled  Discharge Condition:Improved CODE STATUS:Full Diet recommendation: Regular as tolerated   Brief/Interim Summary: 22 y.o. female with medical history significant of mixed connective tissue disease, discoid lupus, hypertension, GERD, angioedema, migraine headaches, tobacco abuse, chronic abdominal pain,, who presents with nausea vomiting, diarrhea and abdominal pain.  Patient has chronic abdominal pain 2/2 enteritis, which was believed to be due to lupus.  Patient states that he started having abdominal pain again last night, which is located in the central abdomen, constant, 10 out of 10 severity, sharp, nonradiating.  It is associated with nausea and vomiting and diarrhea.  She vomited more than 10 times without blood in the vomitus.  She has had at least 5 times of loose stool bowel movement.  She has chills, no fever.  Patient does not have chest pain, shortness of breath, cough.  No symptoms of UTI or unilateral weakness.  Blood pressure elevated initially at SBP 200s, and IV Cardizem was started, blood pressure improved to 147/103 when I saw patient.  ED Course: pt was found to have WBC 11.1, lactic acid 1.28, negative troponin, lipase is 33, negative pregnancy test, negative urinalysis, potassium 2.6, worsening renal function, temperature 97.5, tachycardia, oxygen saturation 92 to 99% on room air.  Patient is admitted to telemetry bed as inpatient.  CT abdomen/pelvis showed: circumferential wall thickening of several small bowel loops within the LOWER abdomen and pelvis compatible with an enteritis.  Hydronephrosis.  Small to moderate free pelvic fluid. No  pneumoperitoneum or bowel obstruction  Abdominal pain, nausea vomiting and diarrhea:  -Per history, noted to be chronic recurrent issue.  -CT abdomen/pelvis on 01/24/2018 showed enteritis, which is believed to be secondary to lupus.   -Pt presents with similar presentation with CT abdomen/pelvis showing enteritis -Patient started on steroids overnight with resolution in presenting symptoms -Will give steroid taper on discharge -Tolerated advancing diet to regular diet  Hypertensive urgency:  -initial SBP at 200s, which improved to 147/103 with IV Cardizem drip. -Likely secondary to inability to take oral medications secondary to nausea vomiting and abdominal pain -Weaned off cardene gtt. Have resumed home BP meds PO  Mixed connective tissue disease and discoid lupus: per previous discharge summary, pt is floowed up with rheumatologist at [facility], Dr Gerilyn Nestle. Had "Discussions about starting Benlysta 200 mg SQ weekly by rheumatologist, will be done only as an outpt". -Patient to f/u with her rheumatologist  GERD: -Protonix   Tobacco abuse: -Nicotine patch while in hosptial  Hypokalemia: K=2.6 on admission. Mg=2.0 - electrolytes replaced  AoCKD-III: Baseline Cre is 1.2-1.3, pt's Cre is 2.59 on admission. Likely due to prerenal secondary to dehydration and continuation of diuretics. -Improved with IVF hydration  Discharge Diagnoses:  Principal Problem:   Abdominal pain Active Problems:   Mixed connective tissue disease ([place])   History of connective tissue disease   Hypertensive urgency   Enteritis   Nausea vomiting and diarrhea   Acute renal failure superimposed on stage 2 chronic kidney disease (HCC)   Hypokalemia   Tobacco abuse    Discharge Instructions   Allergies as of 03/03/2018      Reactions   Lisinopril Anaphylaxis   Angioedema   Vicodin [hydrocodone-acetaminophen] Itching, Rash  Medication List    STOP taking these medications    clindamycin 1 % external solution Commonly known as:  CLEOCIN-T   Levonorgest-Eth Estrad-Fe Bisg 0.1-20 MG-MCG(21) Tabs Commonly known as:  BALCOLTRA     TAKE these medications   amLODipine 10 MG tablet Commonly known as:  NORVASC Take 1 tablet (10 mg total) by mouth daily.   carvedilol 10 MG 24 hr capsule Commonly known as:  COREG CR Take 1 capsule (10 mg total) by mouth daily.   chlorthalidone 50 MG tablet Commonly known as:  HYGROTON Take 1 tablet (50 mg total) by mouth daily.   cloNIDine 0.1 MG tablet Commonly known as:  CATAPRES Take 1 tablet (0.1 mg total) by mouth 3 (three) times daily.   pantoprazole 40 MG tablet Commonly known as:  PROTONIX Take 1 tablet (40 mg total) by mouth 2 (two) times daily.   predniSONE 10 MG tablet Commonly known as:  DELTASONE Take 1 tablet (10 mg total) by mouth daily. What changed:    medication strength  how much to take  how to take this  when to take this  additional instructions   traMADol 50 MG tablet Commonly known as:  ULTRAM Take 1 tablet (50 mg total) by mouth every 6 (six) hours as needed for up to 2 days.      Follow-up Information     Bing, DO. Schedule an appointment as soon as possible for a visit in 1 week(s).   Specialty:  Family Medicine Contact information: [address] [place] [address] [phone]          Allergies  Allergen Reactions  . Lisinopril Anaphylaxis    Angioedema  . Vicodin [Hydrocodone-Acetaminophen] Itching and Rash    Procedures/Studies: Ct Abdomen Pelvis Wo Contrast  Result Date: 03/02/2018 CLINICAL DATA:  22 year old female with acute abdominal pain and vomiting for 1 day. EXAM: CT ABDOMEN AND PELVIS WITHOUT CONTRAST TECHNIQUE: Multidetector CT imaging of the abdomen and pelvis was performed following the standard protocol without IV contrast. COMPARISON:  01/24/2018 and prior CTs FINDINGS: Please note that parenchymal abnormalities may be missed  without intravenous contrast. Lower chest: No acute abnormality Hepatobiliary: The liver and gallbladder are unremarkable. Pancreas: Unremarkable Spleen: Unremarkable Adrenals/Urinary Tract: The kidneys, adrenal glands and bladder are unremarkable. Stomach/Bowel: There is circumferential wall thickening of multiple small bowel loops within the LOWER abdomen and pelvis, likely representing an enteritis. There is no evidence of bowel obstruction or pneumoperitoneum. The appendix is normal. Vascular/Lymphatic: No significant vascular findings are present. No enlarged abdominal or pelvic lymph nodes. Reproductive: Uterus and bilateral adnexa are unremarkable. Other: A small to moderate amount of free pelvic fluid is noted. No abdominal wall hernia. Musculoskeletal: No acute or suspicious focal bony abnormalities. Bilateral L5 pars defects noted without spondylolisthesis. IMPRESSION: 1. Circumferential wall thickening of several small bowel loops within the LOWER abdomen and pelvis compatible with an enteritis. Small to moderate free pelvic fluid. No pneumoperitoneum or bowel obstruction. Electronically Signed   By: Margarette Canada M.D.   On: 03/02/2018 19:41    Subjective: Feels better. Tolerating diet.   Discharge Exam: Vitals:   03/03/18 1450 03/03/18 1638  BP: (!) 160/120 (!) 137/103  Pulse: 92   Resp: 14   Temp: (!) 97.5 F (36.4 C)   SpO2: 100%    Vitals:   03/03/18 0925 03/03/18 1145 03/03/18 1450 03/03/18 1638  BP: (!) 164/125 (!) 170/131 (!) 160/120 (!) 137/103  Pulse: 84  92  Resp:   14   Temp:   (!) 97.5 F (36.4 C)   TempSrc:   Oral   SpO2:   100%   Weight:      Height:        General: Pt is alert, awake, not in acute distress Cardiovascular: RRR, S1/S2 +, no rubs, no gallops Respiratory: CTA bilaterally, no wheezing, no rhonchi Abdominal: Soft, NT, ND, bowel sounds + Extremities: no edema, no cyanosis   The results of significant diagnostics from this hospitalization  (including imaging, microbiology, ancillary and laboratory) are listed below for reference.     Microbiology: Recent Results (from the past 240 hour(s))  Gastrointestinal Panel by PCR , Stool     Status: None   Collection Time: 03/02/18  8:40 PM  Result Value Ref Range Status   Campylobacter species NOT DETECTED NOT DETECTED Final   Plesimonas shigelloides NOT DETECTED NOT DETECTED Final   Salmonella species NOT DETECTED NOT DETECTED Final   Yersinia enterocolitica NOT DETECTED NOT DETECTED Final   Vibrio species NOT DETECTED NOT DETECTED Final   Vibrio cholerae NOT DETECTED NOT DETECTED Final   Enteroaggregative E coli (EAEC) NOT DETECTED NOT DETECTED Final   Enteropathogenic E coli (EPEC) NOT DETECTED NOT DETECTED Final   Enterotoxigenic E coli (ETEC) NOT DETECTED NOT DETECTED Final   Shiga like toxin producing E coli (STEC) NOT DETECTED NOT DETECTED Final   Shigella/Enteroinvasive E coli (EIEC) NOT DETECTED NOT DETECTED Final   Cryptosporidium NOT DETECTED NOT DETECTED Final   Cyclospora cayetanensis NOT DETECTED NOT DETECTED Final   Entamoeba histolytica NOT DETECTED NOT DETECTED Final   Giardia lamblia NOT DETECTED NOT DETECTED Final   Adenovirus F40/41 NOT DETECTED NOT DETECTED Final   Astrovirus NOT DETECTED NOT DETECTED Final   Norovirus GI/GII NOT DETECTED NOT DETECTED Final   Rotavirus A NOT DETECTED NOT DETECTED Final   Sapovirus (I, II, IV, and V) NOT DETECTED NOT DETECTED Final    Comment: Performed at [facility], [address] [place], [place] [address]     Labs: BNP (last 3 results) Recent Labs    11/29/17 1324  BNP 7,616.0*   Basic Metabolic Panel: Recent Labs  Lab 03/02/18 1443 03/02/18 1617 03/03/18 0202  NA 136  --  135  K 2.6*  --  3.2*  CL 93*  --  98  CO2 30  --  21*  GLUCOSE 94  --  123*  BUN 22*  --  19  CREATININE 2.59*  --  2.12*  CALCIUM 10.3  --  9.4  MG  --  2.0  --    Liver Function Tests: Recent Labs  Lab  03/02/18 1443  AST 22  ALT 12  ALKPHOS 85  BILITOT 1.4*  PROT 9.0*  ALBUMIN 4.7   Recent Labs  Lab 03/02/18 1443  LIPASE 33   No results for input(s): AMMONIA in the last 168 hours. CBC: Recent Labs  Lab 03/02/18 1443 03/03/18 0202  WBC 11.1* 13.0*  NEUTROABS 6.8  --   HGB 15.5* 12.8  HCT 45.7 38.8  MCV 97.0 97.2  PLT 244 200   Cardiac Enzymes: No results for input(s): CKTOTAL, CKMB, CKMBINDEX, TROPONINI in the last 168 hours. BNP: Invalid input(s): POCBNP CBG: No results for input(s): GLUCAP in the last 168 hours. D-Dimer No results for input(s): DDIMER in the last 72 hours. Hgb A1c No results for input(s): HGBA1C in the last 72 hours. Lipid Profile No results for input(s): CHOL, HDL,  LDLCALC, TRIG, CHOLHDL, LDLDIRECT in the last 72 hours. Thyroid function studies No results for input(s): TSH, T4TOTAL, T3FREE, THYROIDAB in the last 72 hours.  Invalid input(s): FREET3 Anemia work up No results for input(s): VITAMINB12, FOLATE, FERRITIN, TIBC, IRON, RETICCTPCT in the last 72 hours. Urinalysis    Component Value Date/Time   COLORURINE STRAW (A) 03/02/2018 1730   APPEARANCEUR CLEAR 03/02/2018 1730   LABSPEC 1.006 03/02/2018 1730   PHURINE 7.0 03/02/2018 1730   GLUCOSEU NEGATIVE 03/02/2018 1730   HGBUR SMALL (A) 03/02/2018 1730   BILIRUBINUR NEGATIVE 03/02/2018 1730   BILIRUBINUR Negative 02/10/2018 1602   KETONESUR NEGATIVE 03/02/2018 1730   PROTEINUR 100 (A) 03/02/2018 1730   UROBILINOGEN 0.2 02/10/2018 1602   UROBILINOGEN 1.0 03/30/2015 2328   NITRITE NEGATIVE 03/02/2018 1730   LEUKOCYTESUR NEGATIVE 03/02/2018 1730   Sepsis Labs Invalid input(s): PROCALCITONIN,  WBC,  LACTICIDVEN Microbiology Recent Results (from the past 240 hour(s))  Gastrointestinal Panel by PCR , Stool     Status: None   Collection Time: 03/02/18  8:40 PM  Result Value Ref Range Status   Campylobacter species NOT DETECTED NOT DETECTED Final   Plesimonas shigelloides NOT  DETECTED NOT DETECTED Final   Salmonella species NOT DETECTED NOT DETECTED Final   Yersinia enterocolitica NOT DETECTED NOT DETECTED Final   Vibrio species NOT DETECTED NOT DETECTED Final   Vibrio cholerae NOT DETECTED NOT DETECTED Final   Enteroaggregative E coli (EAEC) NOT DETECTED NOT DETECTED Final   Enteropathogenic E coli (EPEC) NOT DETECTED NOT DETECTED Final   Enterotoxigenic E coli (ETEC) NOT DETECTED NOT DETECTED Final   Shiga like toxin producing E coli (STEC) NOT DETECTED NOT DETECTED Final   Shigella/Enteroinvasive E coli (EIEC) NOT DETECTED NOT DETECTED Final   Cryptosporidium NOT DETECTED NOT DETECTED Final   Cyclospora cayetanensis NOT DETECTED NOT DETECTED Final   Entamoeba histolytica NOT DETECTED NOT DETECTED Final   Giardia lamblia NOT DETECTED NOT DETECTED Final   Adenovirus F40/41 NOT DETECTED NOT DETECTED Final   Astrovirus NOT DETECTED NOT DETECTED Final   Norovirus GI/GII NOT DETECTED NOT DETECTED Final   Rotavirus A NOT DETECTED NOT DETECTED Final   Sapovirus (I, II, IV, and V) NOT DETECTED NOT DETECTED Final    Comment: Performed at [facility], [address], [place], [place] [address]   Time spent: 62min  SIGNED:   Marylu Lund, MD  [facility] 03/03/2018, 6:31 PM  If 7PM-7AM, please contact night-coverage [email or web address] Password [email or web address]

## 2018-03-03 NOTE — ED Provider Notes (Signed)
[place] EMERGENCY DEPARTMENT Provider Note   CSN: [id number] Arrival date & time: 03/03/18  1922     History   Chief Complaint Chief Complaint  Patient presents with  . Enteritis  . Lupus    HPI Linda [name] is a 22 y.o. female with PMH/o enteritis, lupus, migraines, mixed connective tissue disorder, HTN who presents for evaluation of generalized abdominal pain.  Patient seen in the ED last night for evaluation of abdominal pain.  CT and pelvis at that time showed enteritis.  Otherwise work-up was reassuring.  Patient was eventually admitted for pain control.  Patient had evaluation during her admission today.  She was discharged earlier this evening.  Patient comes into the emergency department today because she "felt rushed out of the hospital and states that she needed more time to be evaluated."  Additionally, she is upset because she was discharged without her pain medications.  Patient states that she came right over here because she knew she needed more pain medication.  Patient states that her abdominal pain had improved.  She reports that she was transition from 6 semi-soft foods to solid foods today and she was able to tolerate those without any difficulty.  She had no vomiting.  Patient reports that about an hour prior to discharge, she had some return of her abdominal pain.  This was felt to be part of her chronic abdominal pain.  She had no more vomiting.  Patient was discharged home plans to follow-up with her primary care doctor.  Patient then became upset that she did not have any pain medication prescriptions.  She was offered tramadol prescription but she declines stating "that never works for me I always get oxycodone after I have been in the hospital."  Initially, patient was upset that she did not receive any of her blood pressure medications.  Patient reports that she left the hospital and came straight to the emergency department.  On ED arrival, she  states that her abdominal pain is continued to persist.  No episodes of vomiting.  She had one episode of diarrhea.  No blood in stool.  No fevers, chest pain, difficulty breathing. Patient denies any SI/HI.   The history is provided by the patient.    Past Medical History:  Diagnosis Date  . Acute kidney injury ([place])   . Angioedema   . Hypertension   . Lupus ([place])   . Migraines   . Mixed connective tissue disease [facility])     Patient Active Problem List   Diagnosis Date Noted  . Hypokalemia 03/02/2018  . Tobacco abuse 03/02/2018  . Tongue ulceration 01/24/2018  . Hypertensive emergency   . Tongue swelling 01/07/2018  . Sepsis ([place]) 01/06/2018  . Angioedema 01/02/2018  . Tachycardia 01/02/2018  . Acute renal failure superimposed on stage 2 chronic kidney disease ([place])   . Hypertensive urgency 11/29/2017  . Enteritis 11/29/2017  . Nausea vomiting and diarrhea 11/29/2017  . Leukocytosis 11/29/2017  . Septic prepatellar bursitis of right knee 08/19/2017  . Grief reaction 08/19/2017  . Right knee pain   . Elbow pain, right   . Back pain 05/04/2017  . Bilateral knee swelling 04/20/2017  . History of connective tissue disease   . Leg swelling   . Abdominal pain   . Lupus erythematosus   . Cellulitis 04/09/2017  . Primary hypertension 12/11/2016  . Mixed connective tissue disease ([place]) 02/11/2016  . High BMI 01/19/2014  . Implanon in place 01/19/2014  Past Surgical History:  Procedure Laterality Date  . I&D EXTREMITY Right 08/06/2017   Procedure: IRRIGATION AND DEBRIDEMENT KNEE;  Surgeon: Shona Needles, MD;  Location: [place];  Service: Orthopedics;  Laterality: Right;  . MULTIPLE TOOTH EXTRACTIONS       OB History    Gravida  0   Para  0   Term  0   Preterm  0   AB  0   Living  0     SAB  0   TAB  0   Ectopic  0   Multiple  0   Live Births               Home Medications    Prior to Admission medications   Medication Sig Start Date End  Date Taking? Authorizing Provider  amLODipine (NORVASC) 10 MG tablet Take 1 tablet (10 mg total) by mouth daily. 01/28/18  Yes Alma Friendly, MD  chlorthalidone (HYGROTON) 50 MG tablet Take 1 tablet (50 mg total) by mouth daily. 01/28/18  Yes Alma Friendly, MD  cloNIDine (CATAPRES) 0.1 MG tablet Take 1 tablet (0.1 mg total) by mouth 3 (three) times daily. 01/28/18  Yes Alma Friendly, MD  pantoprazole (PROTONIX) 40 MG tablet Take 1 tablet (40 mg total) by mouth 2 (two) times daily. 01/28/18 03/03/18 Yes Alma Friendly, MD  predniSONE (DELTASONE) 10 MG tablet Take 1 tablet (10 mg total) by mouth daily. 03/03/18  Yes Donne Hazel, MD  carvedilol (COREG CR) 10 MG 24 hr capsule Take 1 capsule (10 mg total) by mouth daily. Patient not taking: Reported on 03/02/2018 01/28/18   Alma Friendly, MD  oxyCODONE-acetaminophen (PERCOCET/ROXICET) 5-325 MG tablet Take 2 tablets by mouth every 4 (four) hours as needed for severe pain. 03/04/18   Volanda Napoleon, PA-C  traMADol (ULTRAM) 50 MG tablet Take 1 tablet (50 mg total) by mouth every 6 (six) hours as needed for up to 2 days. Patient not taking: Reported on 03/03/2018 03/03/18 03/05/18  Donne Hazel, MD    Family History Family History  Problem Relation Age of Onset  . Lupus Mother   . Hypertension Mother   . Kidney disease Mother   . Diabetes Maternal Grandmother     Social History Social History   Tobacco Use  . Smoking status: Current Every Day Smoker    Packs/day: 0.00    Types: Cigarettes, Cigars    Start date: 02/11/2015  . Smokeless tobacco: Never Used  Substance Use Topics  . Alcohol use: No    Alcohol/week: 0.0 oz    Comment: social  . Drug use: No     Allergies   Lisinopril and Vicodin [hydrocodone-acetaminophen]   Review of Systems Review of Systems  Constitutional: Negative for fever.  Respiratory: Negative for cough and shortness of breath.   Cardiovascular: Negative for chest pain.    Gastrointestinal: Positive for abdominal pain and diarrhea. Negative for blood in stool, nausea and vomiting.  Genitourinary: Negative for dysuria and hematuria.  Neurological: Negative for headaches.  All other systems reviewed and are negative.    Physical Exam Updated Vital Signs BP (!) 152/109   Pulse 71   Temp 97.6 F (36.4 C) (Oral)   Resp 18   SpO2 98%   Physical Exam  Constitutional: She is oriented to person, place, and time. She appears well-developed and well-nourished.  Appears uncomfortable but no acute distress   HENT:  Head: Normocephalic and atraumatic.  Mouth/Throat:  Oropharynx is clear and moist and mucous membranes are normal.  Eyes: Pupils are equal, round, and reactive to light. Conjunctivae, EOM and lids are normal.  Neck: Full passive range of motion without pain.  Cardiovascular: Normal rate, regular rhythm, normal heart sounds and normal pulses. Exam reveals no gallop and no friction rub.  No murmur heard. Pulses:      Radial pulses are 2+ on the right side, and 2+ on the left side.  Pulmonary/Chest: Effort normal and breath sounds normal.  Abdominal: Soft. Normal appearance. There is generalized tenderness. There is no rigidity, no guarding, no CVA tenderness and no tenderness at McBurney's point.  Abdomen is soft, nondistended.  Generalized abdominal tenderness with no focal point.  No rigidity, guarding.  No CVA tenderness bilaterally.  Musculoskeletal: Normal range of motion.  Neurological: She is alert and oriented to person, place, and time.  Follows commands, Moves all extremities  5/5 strength to BUE and BLE  Sensation intact throughout all major nerve distributions Normal coordination. No gait abnormalities  No slurred speech. No facial droop.   Skin: Skin is warm and dry. Capillary refill takes less than 2 seconds.  Psychiatric: She has a normal mood and affect. Her speech is normal.  Nursing note and vitals reviewed.    ED Treatments  / Results  Labs (all labs ordered are listed, but only abnormal results are displayed) Labs Reviewed  CBC WITH DIFFERENTIAL/PLATELET - Abnormal; Notable for the following components:      Result Value   WBC 15.1 (*)    Neutro Abs 13.0 (*)    All other components within normal limits  COMPREHENSIVE METABOLIC PANEL - Abnormal; Notable for the following components:   Sodium 133 (*)    Glucose, Bld 130 (*)    Creatinine, Ser 2.37 (*)    GFR calc non Af Amer 28 (*)    GFR calc Af Amer 32 (*)    All other components within normal limits  URINALYSIS, ROUTINE W REFLEX MICROSCOPIC - Abnormal; Notable for the following components:   APPearance CLOUDY (*)    Glucose, UA 50 (*)    Protein, ur 100 (*)    Bacteria, UA RARE (*)    All other components within normal limits  LIPASE, BLOOD  I-STAT BETA HCG BLOOD, ED ([facility], [facility], [facility] ONLY)    EKG None  Radiology Ct Abdomen Pelvis Wo Contrast  Result Date: 03/02/2018 CLINICAL DATA:  22 year old female with acute abdominal pain and vomiting for 1 day. EXAM: CT ABDOMEN AND PELVIS WITHOUT CONTRAST TECHNIQUE: Multidetector CT imaging of the abdomen and pelvis was performed following the standard protocol without IV contrast. COMPARISON:  01/24/2018 and prior CTs FINDINGS: Please note that parenchymal abnormalities may be missed without intravenous contrast. Lower chest: No acute abnormality Hepatobiliary: The liver and gallbladder are unremarkable. Pancreas: Unremarkable Spleen: Unremarkable Adrenals/Urinary Tract: The kidneys, adrenal glands and bladder are unremarkable. Stomach/Bowel: There is circumferential wall thickening of multiple small bowel loops within the LOWER abdomen and pelvis, likely representing an enteritis. There is no evidence of bowel obstruction or pneumoperitoneum. The appendix is normal. Vascular/Lymphatic: No significant vascular findings are present. No enlarged abdominal or pelvic lymph nodes. Reproductive: Uterus and bilateral adnexa  are unremarkable. Other: A small to moderate amount of free pelvic fluid is noted. No abdominal wall hernia. Musculoskeletal: No acute or suspicious focal bony abnormalities. Bilateral L5 pars defects noted without spondylolisthesis. IMPRESSION: 1. Circumferential wall thickening of several small bowel loops within the LOWER abdomen and  pelvis compatible with an enteritis. Small to moderate free pelvic fluid. No pneumoperitoneum or bowel obstruction. Electronically Signed   By: Margarette Canada M.D.   On: 03/02/2018 19:41    Procedures Procedures (including critical care time)  Medications Ordered in ED Medications  sodium chloride 0.9 % bolus 1,000 mL (0 mLs Intravenous Stopped 03/04/18 0121)  methylPREDNISolone sodium succinate (SOLU-MEDROL) 125 mg/2 mL injection 60 mg (60 mg Intravenous Given 03/03/18 2151)  fentaNYL (SUBLIMAZE) injection 50 mcg (50 mcg Intramuscular Given 03/03/18 2311)  ondansetron (ZOFRAN-ODT) disintegrating tablet 4 mg (4 mg Oral Given 03/03/18 2333)  cloNIDine (CATAPRES) tablet 0.1 mg (0.1 mg Oral Given 03/04/18 0017)  HYDROmorphone (DILAUDID) injection 0.5 mg (0.5 mg Intravenous Given 03/04/18 0017)     Initial Impression / Assessment and Plan / ED Course  I have reviewed the triage vital signs and the nursing notes.  Pertinent labs & imaging results that were available during my care of the patient were reviewed by me and considered in my medical decision making (see chart for details).     21 year old female past medical history of enteritis, lupus, hypertension who presents for evaluation of abdominal pain.  Was discharged from [facility] earlier today.  Patient reports that she felt "rushed out of the hospital" felt like she needed more evaluation.  Patient reports she was able to tolerate foods today.  Reports return of her abdominal pain.  No vomiting.  One episode of diarrhea.  No blood in stool.  Patient reports that she became upset because she was prescribed  tramadol and not any pain medication.  Patient states that she came here because she felt like she needed further evaluation. Patient is afebrile, non-toxic appearing, sitting comfortably on examination table. Vital signs reviewed and stable.  Patient is hypertensive at this time.  She denies any chest pain, difficulty breathing, numbness/weakness of her arms or legs.  On exam, abdomen soft, nondistended.  She does have generalized abdominal tenderness no focal point.  Plan to check basic labs.  Given that she just had a CT abdomen pelvis last night, do not feel that this indicates repeating.  Additionally, she has no focal tenderness that would make me concerned about an appendicitis, diverticulitis, hepatobiliary etiology.  Review of patient records show that her work-up in the hospital last night was reassuring.  She had a stool culture performed that was negative for any acute abnormalities.  Additionally, there is documentation that patient became upset that she was not getting any pain medication which is what she relayed to me.  Review of patient records show she has a long-standing history of narcotic prescriptions.  She received 30 oxycodone on 01/28/2018, 80 oxycodone on 01/11/2018, 20 oxycodone on 01/08/2018, 20 oxycodone on 01/03/2018, 20 oxycodone on 12/26/17, 20 oxycodone on 12/04/2017.  Labs reviewed.  UA shows protein.  Otherwise unremarkable.  I-STAT beta negative.  Lipase unremarkable.  CMP shows sodium 133, glucose of 130.  Creatinine is 2.37.  Her baseline appears to be 2.  Yesterday she was 2.12.  During her previous ED visit, she had been 2.59.  CBC does show leukocytosis 15.1.   Earlier today was 13.0. This is an increase but patient was receiving steroids during admission, which could elevate her WBC.  Doubt infectious etiology.  Could be stress reaction. Patient still hypertensive. She is not currently complaining of any CP, SOB, numbness/weakness of her extremities, vision changes. We have  not been able to give her any blood pressure meds in the  ED.   Discussed results with patient.  She reports improvement in pain after IM medications but still is having some pain.  Additionally, will give medications for blood pressure.   Reevaluation after analgesics and blood pressure medication.  Blood pressure has improved to 973 systolic over 532 diastolic.  She still slightly elevated diastolic.  She has not been on her blood pressure medications over the last several months.  I suspect that this is likely the cause.  Do not suspect hypertensive emergency at this time.  I discussed at length with patient regarding her work-up here in the ED.  At this time, I do not see any reason for repeat admission.  It sounds like her abdominal pain is secondary to her chronic enteritis.  Additionally, she has diffuse tenderness with no focal point.  Given that she just had a CT and pelvis done yesterday, do not feel that there is any indication to repeat it given history/physical exam.  Will give additional analgesics and reevaluate patient additionally, will p.o. challenge patient in the department.  Patient was able to drink soda and eat saltines in the department without any difficulty.  She reports her abdominal pain has improved after analgesics.  Repeat abdominal exam shows improvement in tenderness.  No focal tenderness noted.  No rigidity, guarding.  Blood pressure has improved.  She is still slightly hypertensive but patient with no neuro deficits on exam. Suspect this is from not taking her meds. She was prescribed these meds at discharge.  Discussed at length with patient regarding further evaluation.  At this time, her pain has improved.  She is tolerating p.o. in the department.  Her vital signs are stable.  She is still slightly hypertensive but she is likely need of her hypertension medications.  I discussed my concerns with patient regarding her multiple narcotic prescriptions.  She states that she  was gets pain medications whenever she gets dismissed from the hospital until she can follow-up with primary care doctor.  She is planning to call her primary care doctor tomorrow to arrange for an appointment.  We discussed at length regarding trying tramadol for pain relief but she states that does not work. Will plan to give one dose of pain medication until she is able to see her PCP tomorrow.  Additionally, will plan short course of refills for her blood pressure medications to help with blood pressure control.  At this time, patient has reassuring exam, her pain is improved, and her work-up is unremarkable.  She is tolerating p.o. without any difficulty.  Do not feel any indication for admission at this time.  Patient agrees with plan to discharge home.  I offered to discuss with hospitalist for possible admission but patient states that she would like to go home. Patient had ample opportunity for questions and discussion. All patient's questions were answered with full understanding. Strict return precautions discussed. Patient expresses understanding and agreement to plan.   Final Clinical Impressions(s) / ED Diagnoses   Final diagnoses:  Chronic abdominal pain  Enteritis    ED Discharge Orders        Ordered    oxyCODONE-acetaminophen (PERCOCET/ROXICET) 5-325 MG tablet  Every 4 hours PRN     03/04/18 0107       Volanda Napoleon, PA-C 03/04/18 0152    Julianne Rice, MD 03/07/18 2321

## 2018-03-04 LAB — UREA NITROGEN, URINE: Urea Nitrogen, Ur: 200 mg/dL

## 2018-03-04 LAB — URINE CULTURE

## 2018-03-04 MED ORDER — OXYCODONE-ACETAMINOPHEN 5-325 MG PO TABS: 2.0000 | ORAL_TABLET | ORAL | 0 refills | Status: DC | PRN

## 2018-03-04 MED ORDER — HYDROMORPHONE HCL 1 MG/ML IJ SOLN
0.5000 mg | Freq: Once | INTRAMUSCULAR | Status: AC
  Administered 2018-03-04: 0.5 mg via INTRAVENOUS
  Filled 2018-03-04: qty 1

## 2018-03-04 NOTE — Discharge Instructions (Signed)
Take the prednisone as prescribed.  It is very important that you take your blood pressure medications.  Please fill them at the pharmacy and take them as directed.  Follow-up with your primary care doctor in the next 24 to 48 hours for further eval.  Return the emergency department for any worsening pain, fevers, vomiting, difficulty breathing, diarrhea, blood in stool or any other worsening or concerning symptoms.

## 2018-03-08 ENCOUNTER — Other Ambulatory Visit: Payer: Self-pay | Admitting: *Deleted

## 2018-03-08 ENCOUNTER — Telehealth: Payer: Self-pay | Admitting: *Deleted

## 2018-03-08 LAB — CULTURE, BLOOD (ROUTINE X 2)
Culture: NO GROWTH
Culture: NO GROWTH
Special Requests: ADEQUATE

## 2018-03-08 MED ORDER — AMLODIPINE BESYLATE 10 MG PO TABS: 10.0000 mg | ORAL_TABLET | Freq: Every day | ORAL | 3 refills | Status: DC

## 2018-03-08 MED ORDER — CLONIDINE HCL 0.1 MG PO TABS: 0.1000 mg | ORAL_TABLET | Freq: Three times a day (TID) | ORAL | 3 refills | Status: DC

## 2018-03-08 MED ORDER — CARVEDILOL PHOSPHATE ER 10 MG PO CP24: 10.0000 mg | ORAL_CAPSULE | Freq: Every day | ORAL | 3 refills | Status: DC

## 2018-03-08 NOTE — Telephone Encounter (Signed)
Pt is requesting a referral to a pain clinic since her "lupus is causing her to have abdominal pain and she does not want to be on pain meds"  She is requesting a referral to [facility] to see Dr. Mirna Mires. [place], Salome Spotted, CMA

## 2018-03-08 NOTE — Telephone Encounter (Signed)
Patient has appointment with rheumatologist in August which will ultimately be what she needs for her Lupus. She can schedule an appointment to be seen here and we will see what we can do in the interim. Referral to pain clinic is likely not beneficial and will take several months. She will also need to be seen at the clinic first before she sees a specialty clinic. Please advise.  Harriet Butte, [place], PGY-3

## 2018-03-08 NOTE — Telephone Encounter (Signed)
Pt already has an appt. Deseree Kennon Holter, CMA

## 2018-03-09 ENCOUNTER — Other Ambulatory Visit: Payer: Self-pay | Admitting: Family Medicine

## 2018-03-09 ENCOUNTER — Telehealth: Payer: Self-pay

## 2018-03-09 DIAGNOSIS — I10 Essential (primary) hypertension: Secondary | ICD-10-CM

## 2018-03-09 MED ORDER — CARVEDILOL 6.25 MG PO TABS: 6.2500 mg | ORAL_TABLET | Freq: Two times a day (BID) | ORAL | 3 refills | Status: DC

## 2018-03-09 NOTE — Telephone Encounter (Signed)
Received fax from [facility] requesting prior authorization of Carvedilol ER 10 mg.  Form placed in PCP's box for completion along with Medicaid formulary.  Danley Danker, RN [facility] RN)

## 2018-03-09 NOTE — Telephone Encounter (Signed)
Patient was discharged from hospital on extended release carvedilol is not covered by insurance.  Discontinue medication and given prescription for regular carvedilol 25 mg twice daily.  Prescription sent to [facility] on [facility] [address] [facility].  Please advise.  Harriet Butte, [place], PGY-3

## 2018-03-09 NOTE — Telephone Encounter (Signed)
Attempted to call patient and still no answer or voice mail.  Linda [name], [place]

## 2018-03-09 NOTE — Telephone Encounter (Signed)
Pt called checking the status of her medication, as she has been out for 5 days. I informed pt the medication needs a PA, which we are in the process of doing.

## 2018-03-09 NOTE — Telephone Encounter (Signed)
Attempted to call pt no answer/ or voicemail set up. Will try again later. Deseree Kennon Holter, CMA

## 2018-03-10 NOTE — Telephone Encounter (Signed)
Attempted to reach patient once again and there still was no answer or voice mail.  Linda [name], [name]

## 2018-03-11 ENCOUNTER — Ambulatory Visit: Payer: Medicaid Other | Admitting: Family Medicine

## 2018-03-11 NOTE — Telephone Encounter (Signed)
Pt's mother informed.  Pt is currently in Linda [name], Salome Spotted, [place]

## 2018-05-04 DIAGNOSIS — D5939 Other hemolytic-uremic syndrome: Secondary | ICD-10-CM | POA: Insufficient documentation

## 2018-07-12 ENCOUNTER — Telehealth: Payer: Self-pay

## 2018-07-12 NOTE — Telephone Encounter (Signed)
TC from pt requesting an appt c/o possible yeast infection and her clitoris is "hard" and painful. Pt unavailable today for an appt. Pt agrees to an appt tomorrow.

## 2018-07-14 ENCOUNTER — Telehealth: Payer: Self-pay | Admitting: *Deleted

## 2018-07-14 ENCOUNTER — Ambulatory Visit (INDEPENDENT_AMBULATORY_CARE_PROVIDER_SITE_OTHER): Payer: Medicaid Other | Admitting: Obstetrics

## 2018-07-14 ENCOUNTER — Encounter: Payer: Self-pay | Admitting: Obstetrics

## 2018-07-14 ENCOUNTER — Other Ambulatory Visit (HOSPITAL_COMMUNITY)
Admission: RE | Admit: 2018-07-14 | Discharge: 2018-07-14 | Disposition: A | Discharge status: Home / Self Care | Payer: Medicaid Other | Source: Ambulatory Visit | Attending: Obstetrics | Admitting: Obstetrics

## 2018-07-14 VITALS — BP 128/87 | HR 123 | Temp 99.3°F | Wt 168.0 lb

## 2018-07-14 DIAGNOSIS — N898 Other specified noninflammatory disorders of vagina: Secondary | ICD-10-CM

## 2018-07-14 DIAGNOSIS — R102 Pelvic and perineal pain: Secondary | ICD-10-CM | POA: Diagnosis not present

## 2018-07-14 DIAGNOSIS — A6004 Herpesviral vulvovaginitis: Secondary | ICD-10-CM

## 2018-07-14 DIAGNOSIS — J069 Acute upper respiratory infection, unspecified: Secondary | ICD-10-CM

## 2018-07-14 DIAGNOSIS — R110 Nausea: Secondary | ICD-10-CM

## 2018-07-14 MED ORDER — PROMETHAZINE HCL 25 MG PO TABS: 25.0000 mg | ORAL_TABLET | Freq: Four times a day (QID) | ORAL | 0 refills | Status: DC | PRN

## 2018-07-14 MED ORDER — VALACYCLOVIR HCL 1 G PO TABS: ORAL_TABLET | ORAL | 11 refills | Status: DC

## 2018-07-14 MED ORDER — OXYCODONE-ACETAMINOPHEN 5-325 MG PO TABS: 1.0000 | ORAL_TABLET | ORAL | 0 refills | Status: DC | PRN

## 2018-07-14 NOTE — Progress Notes (Signed)
Patient ID: Linda [name], female   DOB: 06-26-1996, 22 y.o.   MRN: [id number]  Chief Complaint  Patient presents with  . Vaginal Pain    HPI Linda [name] is a 22 y.o. female.  Has painful blisters in vulva area that developed ~ 3 days ago, and pain has worsened. HPI  Past Medical History:  Diagnosis Date  . Acute kidney injury ([place])   . Angioedema   . Hypertension   . Lupus ([place])   . Migraines   . Mixed connective tissue disease [facility])     Past Surgical History:  Procedure Laterality Date  . I&D EXTREMITY Right 08/06/2017   Procedure: IRRIGATION AND DEBRIDEMENT KNEE;  Surgeon: Shona Needles, MD;  Location: [facility];  Service: Orthopedics;  Laterality: Right;  . MULTIPLE TOOTH EXTRACTIONS      Family History  Problem Relation Age of Onset  . Lupus Mother   . Hypertension Mother   . Kidney disease Mother   . Diabetes Maternal Grandmother     Social History Social History   Tobacco Use  . Smoking status: Current Every Day Smoker    Packs/day: 0.00    Types: Cigarettes, Cigars    Start date: 02/11/2015  . Smokeless tobacco: Never Used  Substance Use Topics  . Alcohol use: No    Alcohol/week: 0.0 standard drinks    Comment: social  . Drug use: No    Allergies  Allergen Reactions  . Lisinopril Anaphylaxis    Angioedema  . Vicodin [Hydrocodone-Acetaminophen] Itching and Rash    Current Outpatient Medications  Medication Sig Dispense Refill  . amLODipine (NORVASC) 10 MG tablet Take 1 tablet (10 mg total) by mouth daily. 90 tablet 3  . carvedilol (COREG) 6.25 MG tablet Take 1 tablet (6.25 mg total) by mouth 2 (two) times daily with a meal. 60 tablet 3  . chlorthalidone (HYGROTON) 50 MG tablet Take 1 tablet (50 mg total) by mouth daily. 30 tablet 0  . cloNIDine (CATAPRES) 0.1 MG tablet Take 1 tablet (0.1 mg total) by mouth 3 (three) times daily. 270 tablet 3  . oxyCODONE-acetaminophen (PERCOCET/ROXICET) 5-325 MG tablet Take 2 tablets by mouth every 4 (four)  hours as needed for severe pain. 2 tablet 0  . predniSONE (DELTASONE) 10 MG tablet Take 1 tablet (10 mg total) by mouth daily.    Marland Kitchen oxyCODONE-acetaminophen (PERCOCET/ROXICET) 5-325 MG tablet Take 1 tablet by mouth every 4 (four) hours as needed for severe pain. 20 tablet 0  . pantoprazole (PROTONIX) 40 MG tablet Take 1 tablet (40 mg total) by mouth 2 (two) times daily. 60 tablet 0  . promethazine (PHENERGAN) 25 MG tablet Take 1 tablet (25 mg total) by mouth every 6 (six) hours as needed for nausea. 30 tablet 0  . valACYclovir (VALTREX) 1000 MG tablet Take 1 tablet twice a day for 3 days with each outbreak. 30 tablet 11   No current facility-administered medications for this visit.     Review of Systems Review of Systems Constitutional: negative for fatigue and weight loss Respiratory: negative for cough and wheezing Cardiovascular: negative for chest pain, fatigue and palpitations Gastrointestinal: negative for abdominal pain and change in bowel habits Genitourinary:POSITIVE for painful " blisters " in vulva area Integument/breast: negative for nipple discharge Musculoskeletal:negative for myalgias Neurological: negative for gait problems and tremors Behavioral/Psych: negative for abusive relationship, depression Endocrine: negative for temperature intolerance      Blood pressure 128/87, pulse (!) 123, temperature 99.3 F (37.4 C), temperature  source Oral, weight 168 lb (76.2 kg).  Physical Exam Physical Exam           General:  Alert and moderate distress of pain Abdomen:  normal findings: no organomegaly, soft, non-tender and no hernia  Pelvis:  External genitalia: herpetic ulcers in vulva atrea Urinary system: urethral meatus normal and bladder without fullness, nontender Vaginal: normal without tenderness, induration or masses Cervix: not evaluated Adnexa: not evaluated Uterus: not evaluated    50% of 15 min visit spent on counseling and coordination of care.   Data  Reviewed Wet Prep in Cultures  Assessment     1. Herpes simplex vulvovaginitis Rx: - valACYclovir (VALTREX) 1000 MG tablet; Take 1 tablet twice a day for 3 days with each outbreak.  Dispense: 30 tablet; Refill: 11 - Herpes simplex virus culture  2. Vulvar pain FROM HERPETIC ULCERATION  Rx: - oxyCODONE-acetaminophen (PERCOCET/ROXICET) 5-325 MG tablet; Take 1 tablet by mouth every 4 (four) hours as needed for severe pain.  Dispense: 20 tablet; Refill: 0  3. Vaginal discharge Rx: - Cervicovaginal ancillary only  4. URI, acute  5. Nausea Rx: - promethazine (PHENERGAN) 25 MG tablet; Take 1 tablet (25 mg total) by mouth every 6 (six) hours as needed for nausea.  Dispense: 30 tablet; Refill: 0   Plan    FOLLOW UP PRN  Orders Placed This Encounter  Procedures  . Herpes simplex virus culture   Meds ordered this encounter  Medications  . oxyCODONE-acetaminophen (PERCOCET/ROXICET) 5-325 MG tablet    Sig: Take 1 tablet by mouth every 4 (four) hours as needed for severe pain.    Dispense:  20 tablet    Refill:  0  . valACYclovir (VALTREX) 1000 MG tablet    Sig: Take 1 tablet twice a day for 3 days with each outbreak.    Dispense:  30 tablet    Refill:  11  . promethazine (PHENERGAN) 25 MG tablet    Sig: Take 1 tablet (25 mg total) by mouth every 6 (six) hours as needed for nausea.    Dispense:  30 tablet    Refill:  0     Shelly Bombard MD 07-15-2018

## 2018-07-14 NOTE — Progress Notes (Signed)
RGYN pt presents for problem visit today.  CC: blisters in vaginal area that are very painful. Pt initially thought she had a yeast infection because of the irritation but now she is in a great deal of pain.   Pt is also not feeling well.

## 2018-07-14 NOTE — Telephone Encounter (Signed)
Pt calls.  Her OBGYN MD today gave her a script for hydrocodone today.  Unfortunately, pt is in the "locked in program" with medicaid, this means only certain MDs can Rx pain meds and medicaid cover it.   Advised that I would send a message to Dr. Yisroel Ramming to see if he would send this script in again.   In the meantime she is going to call her caseworker and see who is her designated "locked in provider" [place], Salome Spotted, [place]

## 2018-07-14 NOTE — Patient Instructions (Signed)

## 2018-07-15 LAB — CERVICOVAGINAL ANCILLARY ONLY
Bacterial vaginitis: POSITIVE — AB
Candida vaginitis: POSITIVE — AB
Chlamydia: NEGATIVE
Neisseria Gonorrhea: NEGATIVE
Trichomonas: NEGATIVE

## 2018-07-15 NOTE — Telephone Encounter (Signed)
Noted. Yes, patient has never been seen by me.   Harriet Butte, [place], PGY-3

## 2018-07-17 ENCOUNTER — Other Ambulatory Visit: Payer: Self-pay | Admitting: Obstetrics

## 2018-07-17 DIAGNOSIS — N760 Acute vaginitis: Principal | ICD-10-CM

## 2018-07-17 DIAGNOSIS — B373 Candidiasis of vulva and vagina: Secondary | ICD-10-CM

## 2018-07-17 DIAGNOSIS — B3731 Acute candidiasis of vulva and vagina: Secondary | ICD-10-CM

## 2018-07-17 DIAGNOSIS — B9689 Other specified bacterial agents as the cause of diseases classified elsewhere: Secondary | ICD-10-CM

## 2018-07-17 MED ORDER — FLUCONAZOLE 150 MG PO TABS
150.0000 mg | ORAL_TABLET | Freq: Once | ORAL | 0 refills | Status: AC
Start: 2018-07-17 — End: 2018-07-17

## 2018-07-17 MED ORDER — TINIDAZOLE 500 MG PO TABS: 1000.0000 mg | ORAL_TABLET | Freq: Every day | ORAL | 2 refills | Status: DC

## 2018-07-19 LAB — HERPES SIMPLEX VIRUS CULTURE

## 2018-08-04 ENCOUNTER — Encounter: Payer: Self-pay | Admitting: Family Medicine

## 2018-08-05 ENCOUNTER — Ambulatory Visit (HOSPITAL_COMMUNITY)
Admission: RE | Admit: 2018-08-05 | Discharge: 2018-08-05 | Disposition: A | Discharge status: Home / Self Care | Payer: Medicaid Other | Source: Ambulatory Visit | Attending: Nephrology | Admitting: Nephrology

## 2018-08-05 DIAGNOSIS — D649 Anemia, unspecified: Secondary | ICD-10-CM | POA: Diagnosis not present

## 2018-08-05 LAB — PREPARE RBC (CROSSMATCH)

## 2018-08-05 LAB — ABO/RH: ABO/RH(D): O POS

## 2018-08-05 MED ORDER — SODIUM CHLORIDE 0.9% IV SOLUTION
Freq: Once | INTRAVENOUS | Status: AC
  Administered 2018-08-05: 10:00:00 via INTRAVENOUS

## 2018-08-05 NOTE — Progress Notes (Signed)
PATIENT CARE CENTER NOTE  Diagnosis: Anemia    Provider: Dr. Moshe Cipro   Procedure: 2 units PRBC's    Note: Patient received 2 units of blood. Tolerated transfusion with no adverse reaction. Vital signs stable. Discharge instructions given to patient. Patient alert, oriented and ambulatory at discharge.

## 2018-08-05 NOTE — Discharge Instructions (Signed)

## 2018-08-07 LAB — TYPE AND SCREEN
ABO/RH(D): O POS
Antibody Screen: NEGATIVE
Unit division: 0
Unit division: 0

## 2018-08-07 LAB — BPAM RBC
Blood Product Expiration Date: 201912272359
Blood Product Expiration Date: 202001022359
ISSUE DATE / TIME: 201912060949
ISSUE DATE / TIME: 201912060949
Unit Type and Rh: 5100
Unit Type and Rh: 5100

## 2018-08-08 ENCOUNTER — Telehealth: Payer: Self-pay | Admitting: Family Medicine

## 2018-08-08 NOTE — Telephone Encounter (Signed)
Attempted to contact patient regarding the following my chart message:  "May i have A emergency appointment please I have been in pain since I've got released from the hospital and they advised me to get my payments from my primary care doctor and also I need to be referred to a pain clinic"  Contacted both home and mobile number.  No voicemail set up.  Would advise patient to go to urgent care regarding her concerns.   Harriet Butte, [place], PGY-3

## 2018-08-10 ENCOUNTER — Other Ambulatory Visit: Payer: Self-pay

## 2018-08-10 DIAGNOSIS — N185 Chronic kidney disease, stage 5: Secondary | ICD-10-CM

## 2018-08-22 ENCOUNTER — Emergency Department (HOSPITAL_COMMUNITY): Payer: Medicaid Other

## 2018-08-22 ENCOUNTER — Encounter (HOSPITAL_COMMUNITY): Payer: Self-pay | Admitting: Internal Medicine

## 2018-08-22 ENCOUNTER — Emergency Department (HOSPITAL_COMMUNITY)
Admission: EM | Admit: 2018-08-22 | Discharge: 2018-08-22 | Disposition: A | Discharge status: Home / Self Care | Payer: Medicaid Other | Attending: Emergency Medicine | Admitting: Emergency Medicine

## 2018-08-22 DIAGNOSIS — Z992 Dependence on renal dialysis: Secondary | ICD-10-CM | POA: Diagnosis not present

## 2018-08-22 DIAGNOSIS — J90 Pleural effusion, not elsewhere classified: Secondary | ICD-10-CM | POA: Insufficient documentation

## 2018-08-22 DIAGNOSIS — N186 End stage renal disease: Secondary | ICD-10-CM | POA: Diagnosis not present

## 2018-08-22 DIAGNOSIS — I120 Hypertensive chronic kidney disease with stage 5 chronic kidney disease or end stage renal disease: Secondary | ICD-10-CM | POA: Insufficient documentation

## 2018-08-22 DIAGNOSIS — R109 Unspecified abdominal pain: Secondary | ICD-10-CM | POA: Insufficient documentation

## 2018-08-22 DIAGNOSIS — F17210 Nicotine dependence, cigarettes, uncomplicated: Secondary | ICD-10-CM | POA: Diagnosis not present

## 2018-08-22 DIAGNOSIS — Z79899 Other long term (current) drug therapy: Secondary | ICD-10-CM | POA: Diagnosis not present

## 2018-08-22 DIAGNOSIS — R1011 Right upper quadrant pain: Secondary | ICD-10-CM | POA: Diagnosis present

## 2018-08-22 LAB — CBC WITH DIFFERENTIAL/PLATELET
Abs Immature Granulocytes: 0.08 10*3/uL — ABNORMAL HIGH (ref 0.00–0.07)
Basophils Absolute: 0 10*3/uL (ref 0.0–0.1)
Basophils Relative: 0 %
Eosinophils Absolute: 0.2 10*3/uL (ref 0.0–0.5)
Eosinophils Relative: 1 %
HCT: 29.4 % — ABNORMAL LOW (ref 36.0–46.0)
Hemoglobin: 9 g/dL — ABNORMAL LOW (ref 12.0–15.0)
Immature Granulocytes: 1 %
Lymphocytes Relative: 16 %
Lymphs Abs: 2.4 10*3/uL (ref 0.7–4.0)
MCH: 30 pg (ref 26.0–34.0)
MCHC: 30.6 g/dL (ref 30.0–36.0)
MCV: 98 fL (ref 80.0–100.0)
Monocytes Absolute: 1.9 10*3/uL — ABNORMAL HIGH (ref 0.1–1.0)
Monocytes Relative: 13 %
Neutro Abs: 10.5 10*3/uL — ABNORMAL HIGH (ref 1.7–7.7)
Neutrophils Relative %: 69 %
Platelets: 252 10*3/uL (ref 150–400)
RBC: 3 MIL/uL — ABNORMAL LOW (ref 3.87–5.11)
RDW: 21.2 % — ABNORMAL HIGH (ref 11.5–15.5)
WBC: 15.1 10*3/uL — ABNORMAL HIGH (ref 4.0–10.5)
nRBC: 0 % (ref 0.0–0.2)

## 2018-08-22 LAB — COMPREHENSIVE METABOLIC PANEL
ALT: 6 U/L (ref 0–44)
AST: 16 U/L (ref 15–41)
Albumin: 2.4 g/dL — ABNORMAL LOW (ref 3.5–5.0)
Alkaline Phosphatase: 69 U/L (ref 38–126)
Anion gap: 10 (ref 5–15)
BUN: 7 mg/dL (ref 6–20)
CO2: 26 mmol/L (ref 22–32)
Calcium: 9.2 mg/dL (ref 8.9–10.3)
Chloride: 100 mmol/L (ref 98–111)
Creatinine, Ser: 3.51 mg/dL — ABNORMAL HIGH (ref 0.44–1.00)
GFR calc Af Amer: 20 mL/min — ABNORMAL LOW (ref >60)
GFR calc non Af Amer: 17 mL/min — ABNORMAL LOW (ref >60)
Glucose, Bld: 96 mg/dL (ref 70–99)
Potassium: 3 mmol/L — ABNORMAL LOW (ref 3.5–5.1)
Sodium: 136 mmol/L (ref 135–145)
Total Bilirubin: 0.5 mg/dL (ref 0.3–1.2)
Total Protein: 7.6 g/dL (ref 6.5–8.1)

## 2018-08-22 LAB — URINALYSIS, MICROSCOPIC (REFLEX): RBC / HPF: NONE SEEN RBC/hpf (ref 0–5)

## 2018-08-22 LAB — I-STAT BETA HCG BLOOD, ED (MC, WL, AP ONLY): I-stat hCG, quantitative: <5 m[IU]/mL (ref <5)

## 2018-08-22 LAB — URINALYSIS, ROUTINE W REFLEX MICROSCOPIC
Bilirubin Urine: NEGATIVE
Glucose, UA: NEGATIVE mg/dL
Hgb urine dipstick: NEGATIVE
Ketones, ur: NEGATIVE mg/dL
Leukocytes, UA: NEGATIVE
Nitrite: NEGATIVE
Protein, ur: 100 mg/dL — AB
Specific Gravity, Urine: 1.015 (ref 1.005–1.030)
pH: 8 (ref 5.0–8.0)

## 2018-08-22 LAB — LIPASE, BLOOD: Lipase: 18 U/L (ref 11–51)

## 2018-08-22 LAB — TROPONIN I: Troponin I: <0.03 ng/mL (ref <0.03)

## 2018-08-22 MED ORDER — OXYCODONE-ACETAMINOPHEN 5-325 MG PO TABS: 1.0000 | ORAL_TABLET | ORAL | 0 refills | Status: DC | PRN

## 2018-08-22 MED ORDER — HYDROMORPHONE HCL 1 MG/ML IJ SOLN
1.0000 mg | Freq: Once | INTRAMUSCULAR | Status: AC
  Administered 2018-08-22: 1 mg via INTRAVENOUS
  Filled 2018-08-22: qty 1

## 2018-08-22 MED ORDER — DOXYCYCLINE HYCLATE 100 MG PO CAPS: 100.0000 mg | ORAL_CAPSULE | Freq: Two times a day (BID) | ORAL | 0 refills | Status: DC

## 2018-08-22 MED ORDER — DOXYCYCLINE HYCLATE 100 MG PO TABS
100.0000 mg | ORAL_TABLET | Freq: Once | ORAL | Status: AC
  Administered 2018-08-22: 100 mg via ORAL
  Filled 2018-08-22: qty 1

## 2018-08-22 NOTE — ED Notes (Signed)
Pt ambulated to restroom with steady gait. Providing UA

## 2018-08-22 NOTE — Discharge Instructions (Addendum)
Your work-up today shows a small amount of fluid in your right lung.  This is probably causing your pain.  You will be given antibiotics for this.  Because you missed dialysis today, call them immediately after discharge to set up your next dialysis session.  If you develop fever, vomiting, worsening pain, shortness of breath, or any other new/concerning symptoms then return to the ER for evaluation.

## 2018-08-22 NOTE — ED Notes (Signed)
Patient transported to X-ray 

## 2018-08-22 NOTE — ED Notes (Signed)
Sunquest not responding. White label used

## 2018-08-22 NOTE — ED Notes (Signed)
Helped get patient into a gown on the monitor did ekg shown to Dr Regenia Skeeter patient is resting with call bell in reach

## 2018-08-22 NOTE — ED Notes (Signed)
Patient verbalizes understanding of discharge instructions. Opportunity for questioning and answers were provided. Pt discharged from ED. 

## 2018-08-22 NOTE — ED Notes (Signed)
Patient stated that she could not get a urine sample at this time will try later

## 2018-08-22 NOTE — ED Triage Notes (Addendum)
Patient here c/o right sided pain x3 days. States that "the pain feels like I have internal bleeding. I've had it before." Describes pain as sharp from right shoulder radiating to right hip. Denies blood in stool, cough, and vomiting. Last dialysis treatment Saturday 08/20/18.

## 2018-08-22 NOTE — ED Notes (Signed)
ED Provider at bedside. 

## 2018-08-22 NOTE — ED Provider Notes (Signed)
[facility] EMERGENCY DEPARTMENT Provider Note   CSN: [id number] Arrival date & time: 08/22/18  2585     History   Chief Complaint No chief complaint on file.   HPI Linda [name] is a 22 y.o. female.  HPI  22 year old female who has lupus and about a month ago was put on dialysis presents with severe right-sided pain.  She states for the last 3 days she is been having pain from her right shoulder to her right hip including her chest, back, and abdomen on the right side.  She states it is very sharp.  Deep breaths or coughing make it worse.  The patient feels short of breath but is not having a significant cough.  There is no fever, vomiting, vomiting blood, diarrhea, or hematochezia.  She states that this feels like the time she was diagnosed with internal bleeding.  She states that was a few months ago.  She does not know where the bleeding was from.  Her last dialysis was on 12/21 and she is due for dialysis again today due to the holidays.  She is tried Tylenol and a muscle relaxer.  This has not helped.  Past Medical History:  Diagnosis Date  . Acute kidney injury ([place])   . Angioedema   . Hypertension   . Lupus ([place])   . Migraines   . Mixed connective tissue disease [facility])     Patient Active Problem List   Diagnosis Date Noted  . Hypokalemia 03/02/2018  . Tobacco abuse 03/02/2018  . Tongue ulceration 01/24/2018  . Hypertensive emergency   . Tongue swelling 01/07/2018  . Sepsis ([place]) 01/06/2018  . Angioedema 01/02/2018  . Tachycardia 01/02/2018  . Acute renal failure superimposed on stage 2 chronic kidney disease ([place])   . Hypertensive urgency 11/29/2017  . Enteritis 11/29/2017  . Nausea vomiting and diarrhea 11/29/2017  . Leukocytosis 11/29/2017  . Septic prepatellar bursitis of right knee 08/19/2017  . Grief reaction 08/19/2017  . Right knee pain   . Elbow pain, right   . Back pain 05/04/2017  . Bilateral knee swelling 04/20/2017  .  History of connective tissue disease   . Leg swelling   . Abdominal pain   . Lupus erythematosus   . Cellulitis 04/09/2017  . Primary hypertension 12/11/2016  . Mixed connective tissue disease ([place]) 02/11/2016  . High BMI 01/19/2014  . Implanon in place 01/19/2014    Past Surgical History:  Procedure Laterality Date  . I&D EXTREMITY Right 08/06/2017   Procedure: IRRIGATION AND DEBRIDEMENT KNEE;  Surgeon: Shona Needles, MD;  Location: [facility];  Service: Orthopedics;  Laterality: Right;  . MULTIPLE TOOTH EXTRACTIONS       OB History    Gravida  0   Para  0   Term  0   Preterm  0   AB  0   Living  0     SAB  0   TAB  0   Ectopic  0   Multiple  0   Live Births               Home Medications    Prior to Admission medications   Medication Sig Start Date End Date Taking? Authorizing Provider  amLODipine (NORVASC) 10 MG tablet Take 1 tablet (10 mg total) by mouth daily. 03/08/18   [place] Bing, DO  carvedilol (COREG) 6.25 MG tablet Take 1 tablet (6.25 mg total) by mouth 2 (two) times daily with a  meal. 03/09/18   [place] Bing, DO  chlorthalidone (HYGROTON) 50 MG tablet Take 1 tablet (50 mg total) by mouth daily. 01/28/18   Alma Friendly, MD  cloNIDine (CATAPRES) 0.1 MG tablet Take 1 tablet (0.1 mg total) by mouth 3 (three) times daily. 03/08/18   [place] Bing, DO  oxyCODONE-acetaminophen (PERCOCET/ROXICET) 5-325 MG tablet Take 2 tablets by mouth every 4 (four) hours as needed for severe pain. 03/04/18   Volanda Napoleon, PA-C  oxyCODONE-acetaminophen (PERCOCET/ROXICET) 5-325 MG tablet Take 1 tablet by mouth every 4 (four) hours as needed for severe pain. 07/14/18   Shelly Bombard, MD  pantoprazole (PROTONIX) 40 MG tablet Take 1 tablet (40 mg total) by mouth 2 (two) times daily. 01/28/18 03/03/18  Alma Friendly, MD  predniSONE (DELTASONE) 10 MG tablet Take 1 tablet (10 mg total) by mouth daily. 03/03/18   Donne Hazel, MD  promethazine  (PHENERGAN) 25 MG tablet Take 1 tablet (25 mg total) by mouth every 6 (six) hours as needed for nausea. 07/14/18   Shelly Bombard, MD  tinidazole (TINDAMAX) 500 MG tablet Take 2 tablets (1,000 mg total) by mouth daily with breakfast. 07/17/18   Shelly Bombard, MD  valACYclovir (VALTREX) 1000 MG tablet Take 1 tablet twice a day for 3 days with each outbreak. 07/14/18   Shelly Bombard, MD    Family History Family History  Problem Relation Age of Onset  . Lupus Mother   . Hypertension Mother   . Kidney disease Mother   . Diabetes Maternal Grandmother     Social History Social History   Tobacco Use  . Smoking status: Current Every Day Smoker    Packs/day: 0.00    Types: Cigarettes, Cigars    Start date: 02/11/2015  . Smokeless tobacco: Never Used  Substance Use Topics  . Alcohol use: No    Alcohol/week: 0.0 standard drinks    Comment: social  . Drug use: No     Allergies   Lisinopril and Vicodin [hydrocodone-acetaminophen]   Review of Systems Review of Systems  Constitutional: Negative for fever.  Respiratory: Positive for shortness of breath.   Cardiovascular: Positive for chest pain.  Gastrointestinal: Positive for abdominal pain. Negative for blood in stool, diarrhea, nausea and vomiting.  Musculoskeletal: Positive for back pain.  All other systems reviewed and are negative.    Physical Exam Updated Vital Signs BP (!) 138/95   Physical Exam Vitals signs and nursing note reviewed.  Constitutional:      Appearance: She is well-developed. She is not diaphoretic.     Comments: Crying, in pain  HENT:     Head: Normocephalic and atraumatic.     Right Ear: External ear normal.     Left Ear: External ear normal.     Nose: Nose normal.  Eyes:     General:        Right eye: No discharge.        Left eye: No discharge.  Cardiovascular:     Rate and Rhythm: Normal rate and regular rhythm.     Heart sounds: Normal heart sounds.  Pulmonary:     Effort:  Pulmonary effort is normal.     Breath sounds: Normal breath sounds.  Abdominal:     Palpations: Abdomen is soft.     Tenderness: There is abdominal tenderness.     Comments: Diffuse abdominal tenderness, worst in RUQ, RLQ  Skin:    General: Skin is warm and dry.  Neurological:     Mental Status: She is alert.  Psychiatric:        Mood and Affect: Mood is not anxious.      ED Treatments / Results  Labs (all labs ordered are listed, but only abnormal results are displayed) Labs Reviewed  COMPREHENSIVE METABOLIC PANEL - Abnormal; Notable for the following components:      Result Value   Potassium 3.0 (*)    Creatinine, Ser 3.51 (*)    Albumin 2.4 (*)    GFR calc non Af Amer 17 (*)    GFR calc Af Amer 20 (*)    All other components within normal limits  CBC WITH DIFFERENTIAL/PLATELET - Abnormal; Notable for the following components:   WBC 15.1 (*)    RBC 3.00 (*)    Hemoglobin 9.0 (*)    HCT 29.4 (*)    RDW 21.2 (*)    Neutro Abs 10.5 (*)    Monocytes Absolute 1.9 (*)    Abs Immature Granulocytes 0.08 (*)    All other components within normal limits  URINALYSIS, ROUTINE W REFLEX MICROSCOPIC - Abnormal; Notable for the following components:   Protein, ur 100 (*)    All other components within normal limits  URINALYSIS, MICROSCOPIC (REFLEX) - Abnormal; Notable for the following components:   Bacteria, UA RARE (*)    All other components within normal limits  LIPASE, BLOOD  TROPONIN I  I-STAT BETA HCG BLOOD, ED ([facility], [facility], [facility] ONLY)    EKG EKG Interpretation  Date/Time:  Monday August 22 2018 08:16:54 EST Ventricular Rate:  83 PR Interval:    QRS Duration: 101 QT Interval:  394 QTC Calculation: 463 R Axis:   49 Text Interpretation:  Sinus rhythm Consider right atrial enlargement RSR' in V1 or V2, probably normal variant Probable left ventricular hypertrophy Abnormal T, consider ischemia, diffuse leads Anterior ST elevation, probably due to LVH Baseline wander in  lead(s) V6 ST/T changes similar to July 2019 Confirmed by Sherwood Gambler [id number]) on 08/22/2018 8:27:10 AM   Radiology Ct Abdomen Pelvis Wo Contrast  Result Date: 08/22/2018 CLINICAL DATA:  Right lower quadrant abdominal pain for 3 days. History of systemic lupus erythematosus/mixed connective tissue disease and acute kidney injury. EXAM: CT ABDOMEN AND PELVIS WITHOUT CONTRAST TECHNIQUE: Multidetector CT imaging of the abdomen and pelvis was performed following the standard protocol without IV contrast. COMPARISON:  CT 03/02/2018 and 01/24/2018. FINDINGS: Lower chest: Hemodialysis catheter extends to the mid right atrium. The heart is enlarged. There is a new small right pleural effusion with mild bibasilar atelectasis. Hepatobiliary: As evaluated in the noncontrast state, the liver appears stable without focal abnormality. No evidence of gallstones, gallbladder wall thickening or biliary dilatation. Pancreas: Unremarkable. No pancreatic ductal dilatation or surrounding inflammatory changes. Spleen: Normal in size without focal abnormality. Adrenals/Urinary Tract: Both adrenal glands appear normal. No evidence of urinary tract calculus or hydronephrosis. Relative to the previous study, both kidneys are slightly larger, measuring 10.5 cm in length bilaterally. No focal renal abnormalities are seen. The bladder appears unremarkable for its degree of distention. Stomach/Bowel: No enteric contrast was administered. No residual bowel wall thickening, distention or surrounding inflammation identified. There are mild diverticular changes of the sigmoid colon. The appendix appears normal. Vascular/Lymphatic: Small retroperitoneal lymph nodes are similar to the previous study. No significant vascular findings on noncontrast imaging. Reproductive: Uterus and ovaries appear normal.  No adnexal mass. Other: Moderate free pelvic fluid, similar to previous study. No focal extraluminal fluid or  air collection.  Musculoskeletal: No acute or significant osseous findings. Bilateral L5 pars defects are again noted with a slight anterolisthesis and mild biforaminal narrowing at L5-S1. There is a small central disc protrusion at L4-5. IMPRESSION: 1. No clear acute abdominal findings or explanation for the patient's symptoms. The appendix appears normal. 2. No residual bowel wall thickening identified. 3. New small right pleural effusion with mild bibasilar atelectasis. Pelvic ascites, similar to previous study. 4. Relative to the previous study, both kidneys appears slightly larger, potentially secondary to acute kidney injury. Electronically Signed   By: Richardean Sale M.D.   On: 08/22/2018 13:07   Dg Chest 2 View  Result Date: 08/22/2018 CLINICAL DATA:  22 year old female with right-sided chest pain EXAM: CHEST - 2 VIEW COMPARISON:  Prior chest x-ray 01/24/2018 FINDINGS: Right IJ approach tunneled hemodialysis catheter. The catheter tip overlies the upper right atrium in good position. Stable mild cardiomegaly. Inspiratory volumes are low but the lungs are clear. No acute osseous abnormality. IMPRESSION: No active cardiopulmonary disease. Electronically Signed   By: Jacqulynn Cadet M.D.   On: 08/22/2018 09:02    Procedures Procedures (including critical care time)  Medications Ordered in ED Medications  HYDROmorphone (DILAUDID) injection 1 mg (1 mg Intravenous Given 08/22/18 0943)  HYDROmorphone (DILAUDID) injection 1 mg (1 mg Intravenous Given 08/22/18 1145)  doxycycline (VIBRA-TABS) tablet 100 mg (100 mg Oral Given 08/22/18 1525)     Initial Impression / Assessment and Plan / ED Course  I have reviewed the triage vital signs and the nursing notes.  Pertinent labs & imaging results that were available during my care of the patient were reviewed by me and considered in my medical decision making (see chart for details).  Clinical Course as of Aug 23 902  Mon Aug 22, 2018  1030 I had a long  discussion with patient and mom who is on the phone about CT abdomen/pelvis.  The patient is having pretty significant right-sided abdominal pain which she states is not normal for her.  The only other time she has felt this way was when she had the renal biopsy and postop bleeding.  Hemoglobin about 9.0 is similar to earlier in the month at [facility] where it was 9.7.  I doubt acute bleeding.  I discussed possible long-term side effects such as increased cancer risk from multiple CTs such as how she has had in the past.  However I also discussed she may have an acute emergent condition.  She and mom want to continue with CT. I will redose dilaudid.   [SG]    Clinical Course User Index [SG] Sherwood Gambler, MD    Labs show elevated WBC. CT shows no acute intra-abd pathology. There is a right sided pleural effusion (small), but given location this could certainly cause right shoulder, back, chest/abd pain. She has mild dyspnea but no increased WOB or hypoxia. I will treat is as possibly infectious with doxycycline and have her follow up with PCP and dialysis. No need for emergent dialysis today.  Final Clinical Impressions(s) / ED Diagnoses   Final diagnoses:  Pleural effusion on right  Right sided abdominal pain    ED Discharge Orders    None       Sherwood Gambler, MD 08/23/18 [phone]

## 2018-08-26 ENCOUNTER — Ambulatory Visit (HOSPITAL_COMMUNITY): Payer: Medicaid Other

## 2018-08-26 ENCOUNTER — Ambulatory Visit (HOSPITAL_COMMUNITY): Payer: Medicaid Other | Attending: Vascular Surgery

## 2018-08-26 ENCOUNTER — Encounter: Payer: Medicaid Other | Admitting: Vascular Surgery

## 2018-08-29 ENCOUNTER — Ambulatory Visit: Payer: Medicaid Other | Admitting: Family Medicine

## 2018-08-29 ENCOUNTER — Encounter: Payer: Self-pay | Admitting: Vascular Surgery

## 2018-09-28 ENCOUNTER — Encounter: Payer: Self-pay | Admitting: Family Medicine

## 2018-10-04 ENCOUNTER — Ambulatory Visit (INDEPENDENT_AMBULATORY_CARE_PROVIDER_SITE_OTHER)
Admission: RE | Admit: 2018-10-04 | Discharge: 2018-10-04 | Disposition: A | Discharge status: Home / Self Care | Payer: Medicaid Other | Source: Ambulatory Visit | Attending: Vascular Surgery | Admitting: Vascular Surgery

## 2018-10-04 ENCOUNTER — Encounter: Payer: Self-pay | Admitting: Vascular Surgery

## 2018-10-04 ENCOUNTER — Ambulatory Visit (INDEPENDENT_AMBULATORY_CARE_PROVIDER_SITE_OTHER): Payer: Self-pay | Admitting: Vascular Surgery

## 2018-10-04 ENCOUNTER — Other Ambulatory Visit: Payer: Self-pay

## 2018-10-04 ENCOUNTER — Ambulatory Visit (HOSPITAL_COMMUNITY)
Admission: RE | Admit: 2018-10-04 | Discharge: 2018-10-04 | Disposition: A | Discharge status: Home / Self Care | Payer: Medicaid Other | Source: Ambulatory Visit | Attending: Vascular Surgery | Admitting: Vascular Surgery

## 2018-10-04 VITALS — BP 113/80 | HR 70 | Temp 97.1°F | Resp 20 | Ht 66.0 in | Wt 168.0 lb

## 2018-10-04 DIAGNOSIS — Z992 Dependence on renal dialysis: Secondary | ICD-10-CM

## 2018-10-04 DIAGNOSIS — N185 Chronic kidney disease, stage 5: Secondary | ICD-10-CM | POA: Diagnosis not present

## 2018-10-04 DIAGNOSIS — N186 End stage renal disease: Secondary | ICD-10-CM

## 2018-10-05 NOTE — Progress Notes (Signed)
Left without being seen.  Did not want to wait per nurse.

## 2018-10-06 ENCOUNTER — Ambulatory Visit (INDEPENDENT_AMBULATORY_CARE_PROVIDER_SITE_OTHER): Payer: Medicaid Other | Admitting: Obstetrics

## 2018-10-06 ENCOUNTER — Encounter: Payer: Self-pay | Admitting: Obstetrics

## 2018-10-06 VITALS — BP 134/94 | Wt 168.0 lb

## 2018-10-06 DIAGNOSIS — Z992 Dependence on renal dialysis: Secondary | ICD-10-CM | POA: Diagnosis not present

## 2018-10-06 DIAGNOSIS — N898 Other specified noninflammatory disorders of vagina: Secondary | ICD-10-CM | POA: Diagnosis not present

## 2018-10-06 DIAGNOSIS — M3214 Glomerular disease in systemic lupus erythematosus: Secondary | ICD-10-CM | POA: Diagnosis not present

## 2018-10-06 DIAGNOSIS — N760 Acute vaginitis: Secondary | ICD-10-CM

## 2018-10-06 DIAGNOSIS — B9689 Other specified bacterial agents as the cause of diseases classified elsewhere: Secondary | ICD-10-CM

## 2018-10-06 MED ORDER — METRONIDAZOLE 500 MG PO TABS: 500.0000 mg | ORAL_TABLET | Freq: Two times a day (BID) | ORAL | 2 refills | Status: DC

## 2018-10-06 NOTE — Progress Notes (Signed)
Patient ID: Linda [name], female   DOB: 1996/03/22, 23 y.o.   MRN: [id number]  Chief Complaint  Patient presents with  . Vaginal Discharge    HPI Linda [name] is a 23 y.o. female.  MALODOROUS VAGINAL DISCHARGE HPI  Past Medical History:  Diagnosis Date  . Acute kidney injury ([place])   . Angioedema   . Hypertension   . Lupus ([place])   . Migraines   . Mixed connective tissue disease [facility])     Past Surgical History:  Procedure Laterality Date  . I&D EXTREMITY Right 08/06/2017   Procedure: IRRIGATION AND DEBRIDEMENT KNEE;  Surgeon: Shona Needles, MD;  Location: [place];  Service: Orthopedics;  Laterality: Right;  . MULTIPLE TOOTH EXTRACTIONS      Family History  Problem Relation Age of Onset  . Lupus Mother   . Hypertension Mother   . Kidney disease Mother   . Diabetes Maternal Grandmother     Social History Social History   Tobacco Use  . Smoking status: Current Every Day Smoker    Packs/day: 0.00    Types: Cigarettes, Cigars    Start date: 02/11/2015  . Smokeless tobacco: Never Used  Substance Use Topics  . Alcohol use: No    Alcohol/week: 0.0 standard drinks    Comment: social  . Drug use: No    Allergies  Allergen Reactions  . Lisinopril Anaphylaxis    Angioedema  . Vicodin [Hydrocodone-Acetaminophen] Itching and Rash    Current Outpatient Medications  Medication Sig Dispense Refill  . acetaminophen-codeine (TYLENOL #3) 300-30 MG tablet Take 1 tablet by mouth 2 times a day as needed for pain    . B Complex-C (B-COMPLEX WITH VITAMIN C) tablet Take 1 tablet by mouth daily.    . carvedilol (COREG) 6.25 MG tablet Take 1 tablet (6.25 mg total) by mouth 2 (two) times daily with a meal. 60 tablet 3  . multivitamin (RENA-VIT) TABS tablet     . zolpidem (AMBIEN) 5 MG tablet Take 5 mg by mouth at bedtime as needed for sleep.   3  . amLODipine (NORVASC) 10 MG tablet Take 1 tablet (10 mg total) by mouth daily. (Patient not taking: Reported on 08/22/2018) 90  tablet 3  . chlorthalidone (HYGROTON) 50 MG tablet Take 1 tablet (50 mg total) by mouth daily. (Patient not taking: Reported on 10/06/2018) 30 tablet 0  . cloNIDine (CATAPRES) 0.1 MG tablet Take 1 tablet (0.1 mg total) by mouth 3 (three) times daily. (Patient not taking: Reported on 10/06/2018) 270 tablet 3  . diltiazem (CARDIZEM CD) 120 MG 24 hr capsule Take 120 mg by mouth daily.  2  . oxyCODONE-acetaminophen (PERCOCET) 5-325 MG tablet Take 1 tablet by mouth every 4 (four) hours as needed for severe pain. (Patient not taking: Reported on 10/06/2018) 10 tablet 0  . pantoprazole (PROTONIX) 40 MG tablet Take 1 tablet (40 mg total) by mouth 2 (two) times daily. 60 tablet 0  . promethazine (PHENERGAN) 25 MG tablet Take 1 tablet (25 mg total) by mouth every 6 (six) hours as needed for nausea. (Patient not taking: Reported on 10/06/2018) 30 tablet 0  . sevelamer (RENAGEL) 800 MG tablet Take 800 mg by mouth 3 (three) times daily.    Marland Kitchen spironolactone (ALDACTONE) 25 MG tablet Take 25 mg by mouth daily.  1  . valACYclovir (VALTREX) 1000 MG tablet Take 1 tablet twice a day for 3 days with each outbreak. (Patient not taking: Reported on 10/06/2018) 30 tablet 11  No current facility-administered medications for this visit.     Review of Systems Review of Systems Constitutional: negative for fatigue and weight loss Respiratory: negative for cough and wheezing Cardiovascular: negative for chest pain, fatigue and palpitations Gastrointestinal: negative for abdominal pain and change in bowel habits Genitourinary:POSITIVE for vaginal discharge with odor Integument/breast: negative for nipple discharge Musculoskeletal:negative for myalgias Neurological: negative for gait problems and tremors Behavioral/Psych: negative for abusive relationship, depression Endocrine: negative for temperature intolerance      There were no vitals taken for this visit.  Physical Exam Physical Exam:  Deferred. Patient started period  last night, and it is heavy   >50% of 15 min visit spent on counseling and coordination of care.   Data Reviewed Labs  Assessment and Plan:    1. Vaginal discharge  2. BV (bacterial vaginosis) Rx: - metroNIDAZOLE (FLAGYL) 500 MG tablet; Take 1 tablet (500 mg total) by mouth 2 (two) times daily.  Dispense: 14 tablet; Refill: 2  3. Systemic lupus erythematosus with glomerular disease, unspecified SLE type ([place])  4. Dialysis patient [facility])     Plan    Follow up in 6 weeks for Annual / Pap  No orders of the defined types were placed in this encounter.  No orders of the defined types were placed in this encounter.    Shelly Bombard MD 10-06-2018

## 2018-10-06 NOTE — Progress Notes (Signed)
Pt presents for vaginal discharge with odor. Pt started period last night and states she is bleeding heavy.

## 2018-11-06 IMAGING — MR MR KNEE*R* W/O CM
4 of 5 series · 18 of 40 positions shown · non-contrast
Comparison: None.

CLINICAL DATA: Right knee swelling and redness.

EXAM:
MRI OF THE RIGHT KNEE WITHOUT CONTRAST
TECHNIQUE: Multiplanar, multisequence MR imaging of the knee was performed. No
intravenous contrast was administered.

[Series 2: PD fat-sat · axial · 4.0mm · 0.35mm/px · z∈[-102,+33]mm · 9 of 28 slices shown (1 of 3)]
[im 1/28]
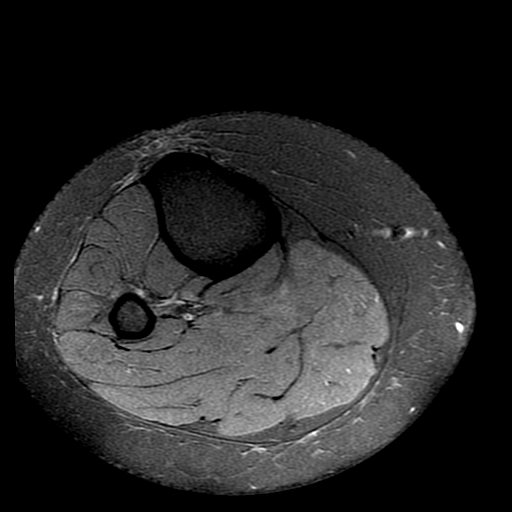
[im 4/28]
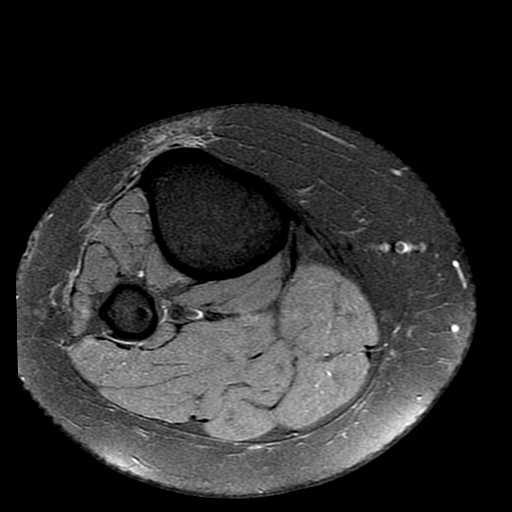
[im 7/28]
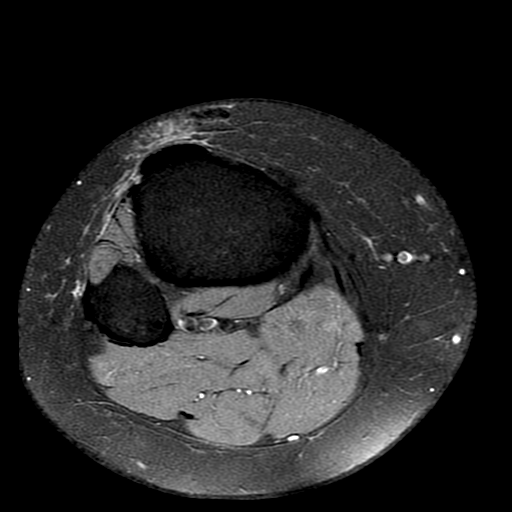
[im 11/28]
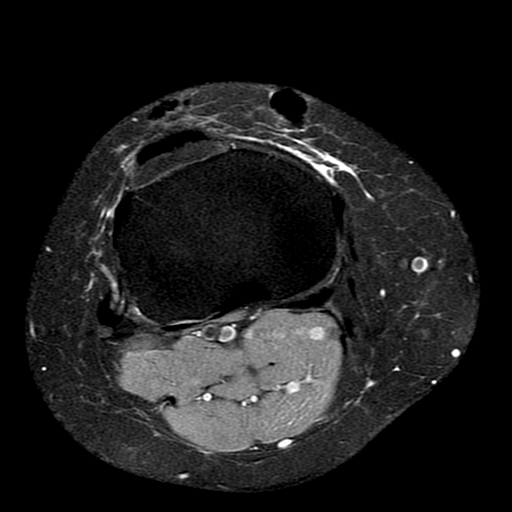
[im 14/28]
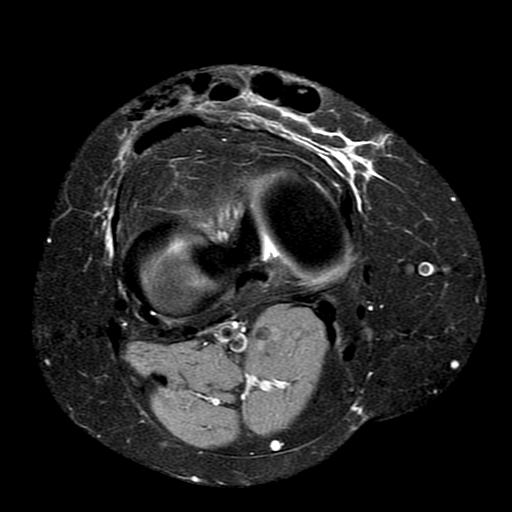
[im 17/28]
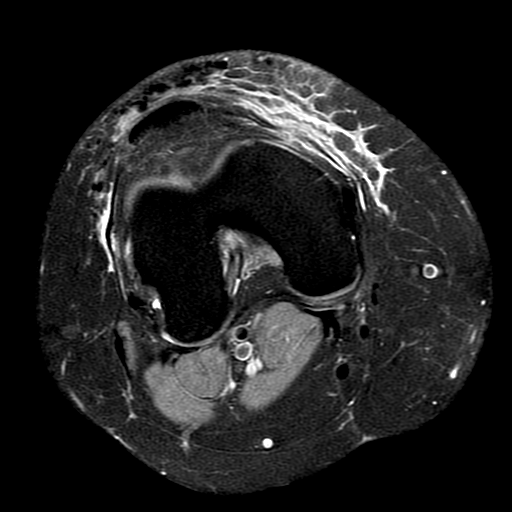
[im 21/28]
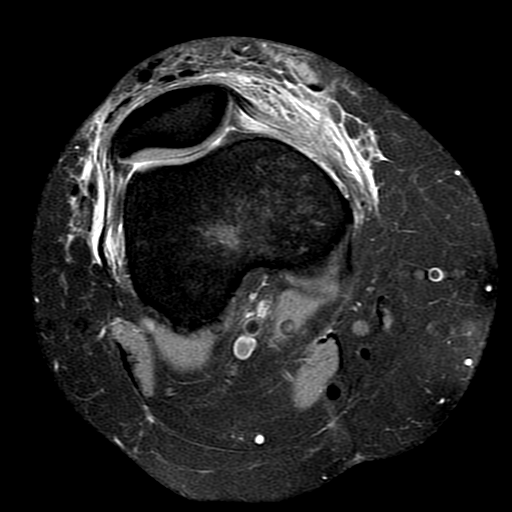
[im 24/28]
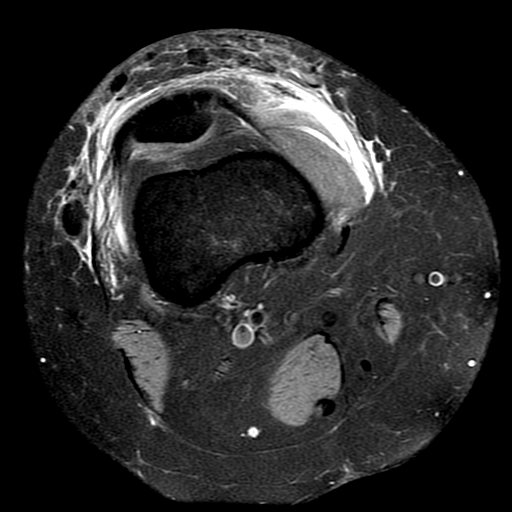
[im 28/28]
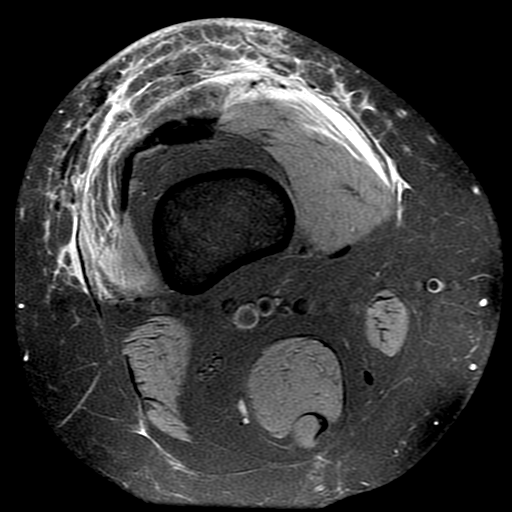

[Series 3: PD fat-sat · coronal · 4.0mm · 0.35mm/px · 3 of 24 slices shown (2 of 3)]
[im 4/24]
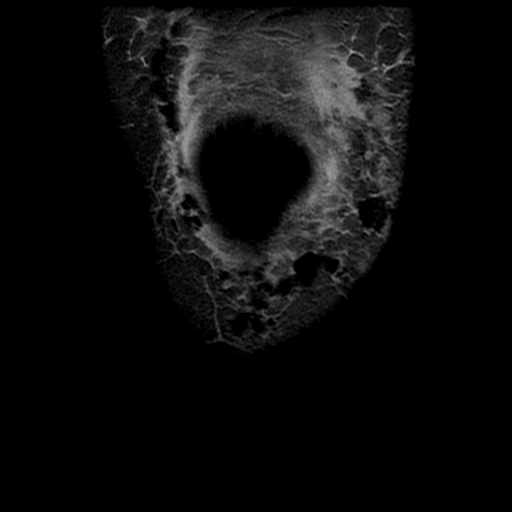
[im 14/24]
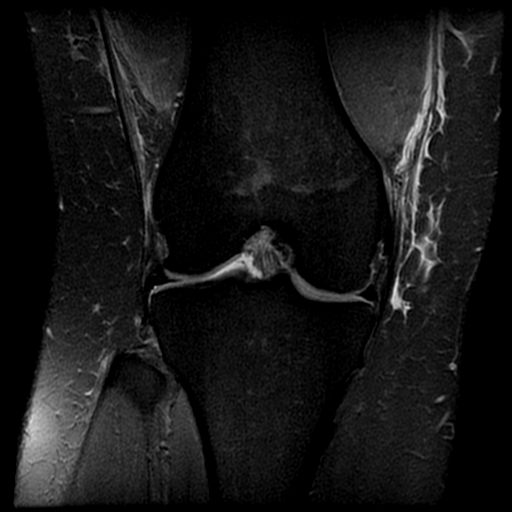
[im 20/24]
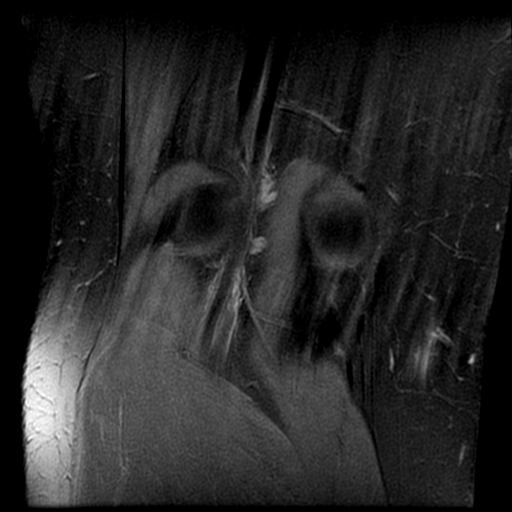

[Series 4: T2 fat-sat · coronal · 4.0mm · 0.35mm/px · 3 of 24 slices shown]
[im 4/24]
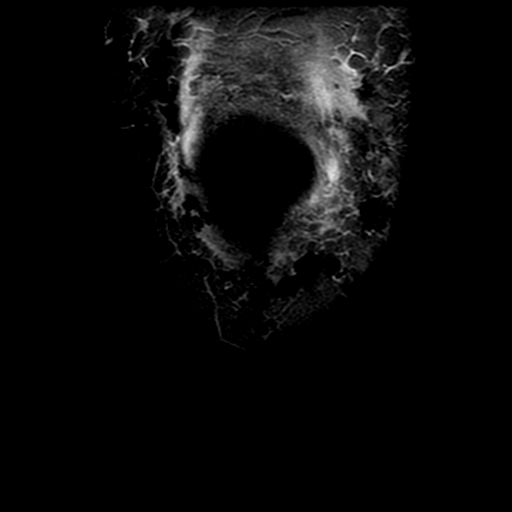
[im 14/24]
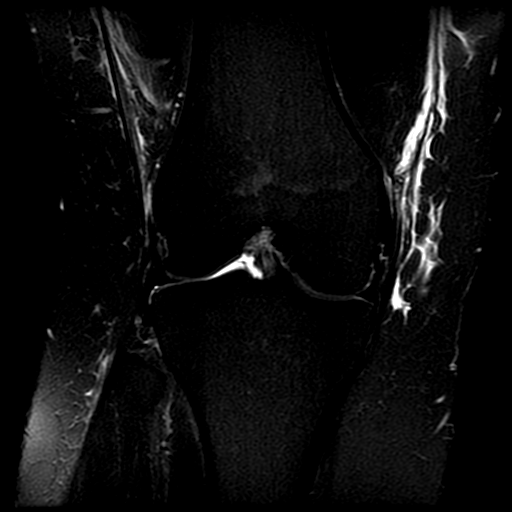
[im 20/24]
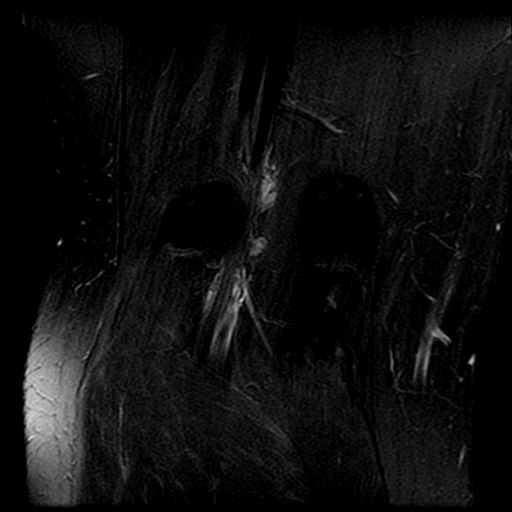

[Series 6: PD fat-sat · sagittal · 4.0mm · 0.33mm/px · 3 of 23 slices shown (3 of 3)]
[im 4/23]
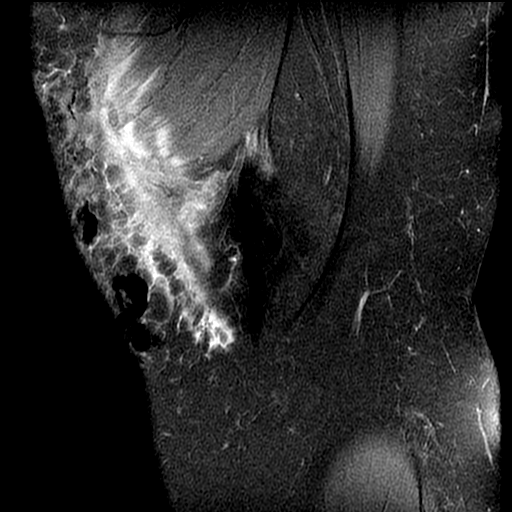
[im 12/23]
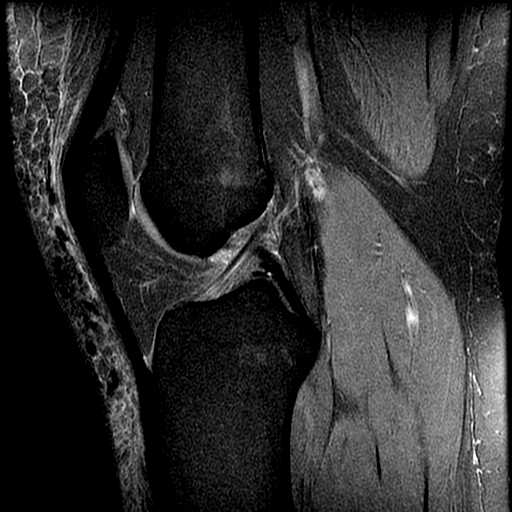
[im 19/23]
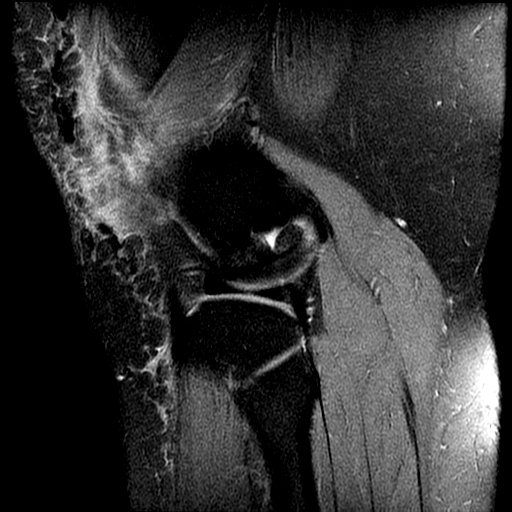

[18 of 40 positions shown; findings below may reference images not displayed]

FINDINGS: MENISCI

Medial meniscus:  Intact and normal morphology.

Lateral meniscus:  Intact and normal morphology.

LIGAMENTS

Cruciates:  Intact ACL and PCL.

Collaterals: Medial collateral ligament is intact. Lateral
collateral ligament complex is intact.

CARTILAGE

Patellofemoral:  Normal.

Medial:  Normal.

Lateral:  Normal.

Joint:  No joint effusion. Normal Hoffa's fat. No plical thickening.

Popliteal Fossa:  No Baker cyst. Intact popliteus tendon.

Extensor Mechanism:  Intact quadriceps tendon and patellar tendon.

Bones: No focal marrow signal abnormality. No fracture or
dislocation.

Other: There is ill definition of and increased signal within the
medial patellofemoral ligament.

There is prominent soft tissue edema and fluid along the anterior
knee, with multiple scattered foci of T1 and T2 hypointense signal,
concerning for gas. There is edema involving the distal vastus
lateralis and vastus medialis muscles.
IMPRESSION: 1. Prominent soft tissue edema and fluid anterior to the knee, with
scattered foci of T1 and T2 hypointensity, concerning for
subcutaneous emphysema, raising the possibility of gas-forming
organisms. Clinical correlation for necrotizing fasciitis is
recommended.
2. Fluid and inflammation extends into the distal aspects of the
vastus lateralis and vastus medialis muscles. There is also
involvement of the medial patellofemoral ligament, which may be
partially torn as a result.
3. No knee joint effusion.  No evidence of internal derangement.
Critical Value/emergent results were called by telephone at the time
of interpretation on 04/09/2017 at [DATE] to Dr. Joshjax, [place], who
verbally acknowledged these results.

## 2018-11-08 ENCOUNTER — Encounter: Payer: Medicaid Other | Admitting: Vascular Surgery

## 2018-11-17 ENCOUNTER — Ambulatory Visit: Payer: Medicaid Other | Admitting: Obstetrics

## 2018-11-24 ENCOUNTER — Encounter (INDEPENDENT_AMBULATORY_CARE_PROVIDER_SITE_OTHER): Payer: Medicaid Other | Admitting: Vascular Surgery

## 2018-12-27 ENCOUNTER — Telehealth (INDEPENDENT_AMBULATORY_CARE_PROVIDER_SITE_OTHER): Payer: Medicaid Other | Admitting: Family Medicine

## 2018-12-27 ENCOUNTER — Other Ambulatory Visit: Payer: Self-pay

## 2018-12-27 NOTE — Assessment & Plan Note (Addendum)
Pt has been dealing w this for some time.  She has lupus and end-stage renal disease and is followed by nephrology at [facility].  Through care everywhere looks like there have been several attempts to get her to a pain management specialist.  PDMP review suggests last narcotic was November when she was discharged from the hospital.  I do certainly appreciate that this is quite a painful condition, but it is not appropriate to prescribe a new narcotic over virtual visit.  I am happy to place a pain management referral and listed in the comments the specific locations that were convenient to her.  I do wonder whether she would benefit from a pain specialist that is within the [facility] system for continuity of care.  I also wonder whether eventually she will need a wound care specialist to assess the calciphylaxis lesions.  I did asked the patient to make an in person visit over the next month or so to check in with her regular doctor as well as get up-to-date on health maintenance.  While she noted that 1 of the lesions had expressed some discharge recently, over video I did not see any signs of acute infection.  She certainly has had preseptal bursitis in the past.  I recommended that she come in or go to the emergency room if she has persistent drainage or concerns with infection.

## 2018-12-27 NOTE — Progress Notes (Signed)
[place] Telemedicine Visit  Patient consented to have virtual visit. Method of visit: Video  Encounter participants: Patient: Linda [name] - located at home Provider: Ralene Ok - located at [facility]  Others (if applicable): none  Chief Complaint: pain, achy   HPI:  Thinks she had callouses in her knees, she can't wear jeans because it hurts to the touch. The last time she was in the hospital she was told she would need to be referred to a pain clinic. This was November 2019, she is on HD now. Her nephrologist is Dr. Kern Alberta. She was at [facility] in November. She got oxy 10 in the hospital and felt that helped. She tried to see the rheumatologist, she was told not to get pain meds there. [facility] is what she had in mind off of [address]. Mostly in her knees. Sometimes in her whole leg. None are open right now. Yesterday one opened with pus coming out - she feels like this is all better now. No fevers. Had to have surgery 3 years ago on her knee. She wondered if we could give a temporary pain med rx until her appt.   ROS: per HPI  Pertinent PMHx: ESRD, SLE, calciphylaxis   Exam:  Respiratory: normal WOB on video Skin: was able to visualize calciphylaxis lesions via video on left knee. No appreciable erythema or drainage.   Assessment/Plan:  Calciphylaxis Pt has been dealing w this for some time.  She has lupus and end-stage renal disease and is followed by nephrology at [facility].  Through care everywhere looks like there have been several attempts to get her to a pain management specialist.  PDMP review suggests last narcotic was November when she was discharged from the hospital.  I do certainly appreciate that this is quite a painful condition, but it is not appropriate to prescribe a new narcotic over virtual visit.  I am happy to place a pain management referral and listed in the comments the specific locations that were convenient to  her.  I do wonder whether she would benefit from a pain specialist that is within the [facility] system for continuity of care.  I also wonder whether eventually she will need a wound care specialist to assess the calciphylaxis lesions.  I did asked the patient to make an in person visit over the next month or so to check in with her regular doctor as well as get up-to-date on health maintenance.  While she noted that 1 of the lesions had expressed some discharge recently, over video I did not see any signs of acute infection.  She certainly has had preseptal bursitis in the past.  I recommended that she come in or go to the emergency room if she has persistent drainage or concerns with infection.   Time spent during visit with patient: 18 minutes

## 2018-12-29 ENCOUNTER — Encounter (INDEPENDENT_AMBULATORY_CARE_PROVIDER_SITE_OTHER): Payer: Medicaid Other | Admitting: Vascular Surgery

## 2019-01-09 ENCOUNTER — Telehealth (HOSPITAL_COMMUNITY): Payer: Self-pay | Admitting: Rehabilitation

## 2019-01-09 NOTE — Telephone Encounter (Signed)
The above patient or their representative was contacted and gave the following answers to these questions:         Do you have any of the following symptoms? No  Fever                    Cough                   Shortness of breath  Do  you have any of the following other symptoms? No   muscle pain         vomiting,        diarrhea        rash         weakness        red eye        abdominal pain         bruising          bruising or bleeding              joint pain           severe headache    Have you been in contact with someone who was or has been sick in the past 2 weeks? No  Yes                 Unsure                         Unable to assess   Does the person that you were in contact with have any of the following symptoms?   Cough         shortness of breath           muscle pain         vomiting,            diarrhea            rash            weakness           fever            red eye           abdominal pain           bruising  or  bleeding                joint pain                severe headache               Have you  or someone you have been in contact with traveled internationally in th last month? No        If yes, which countries?   Have you  or someone you have been in contact with traveled outside [place] in th last month? No         If yes, which state and city?   COMMENTS OR ACTION PLAN FOR THIS PATIENT:          

## 2019-01-10 ENCOUNTER — Encounter: Payer: Medicaid Other | Admitting: Vascular Surgery

## 2019-01-10 ENCOUNTER — Encounter (INDEPENDENT_AMBULATORY_CARE_PROVIDER_SITE_OTHER): Payer: Self-pay | Admitting: Vascular Surgery

## 2019-01-10 ENCOUNTER — Encounter: Payer: Self-pay | Admitting: Family

## 2019-01-10 ENCOUNTER — Ambulatory Visit (INDEPENDENT_AMBULATORY_CARE_PROVIDER_SITE_OTHER): Payer: Medicaid Other | Admitting: Vascular Surgery

## 2019-01-10 ENCOUNTER — Other Ambulatory Visit: Payer: Self-pay

## 2019-01-10 VITALS — BP 133/89 | HR 80 | Resp 16 | Ht 66.0 in | Wt 166.0 lb

## 2019-01-10 DIAGNOSIS — N186 End stage renal disease: Secondary | ICD-10-CM | POA: Insufficient documentation

## 2019-01-10 DIAGNOSIS — I10 Essential (primary) hypertension: Secondary | ICD-10-CM

## 2019-01-10 DIAGNOSIS — F17210 Nicotine dependence, cigarettes, uncomplicated: Secondary | ICD-10-CM

## 2019-01-10 DIAGNOSIS — Z992 Dependence on renal dialysis: Secondary | ICD-10-CM | POA: Diagnosis not present

## 2019-01-10 DIAGNOSIS — I120 Hypertensive chronic kidney disease with stage 5 chronic kidney disease or end stage renal disease: Secondary | ICD-10-CM | POA: Diagnosis not present

## 2019-01-10 DIAGNOSIS — L930 Discoid lupus erythematosus: Secondary | ICD-10-CM | POA: Diagnosis not present

## 2019-01-10 NOTE — Assessment & Plan Note (Signed)
Likely an underlying cause of her renal failure.

## 2019-01-10 NOTE — Progress Notes (Signed)
Patient ID: Linda [name], female   DOB: 1995/10/14, 23 y.o.   MRN: [id number]  Chief Complaint  Patient presents with   Follow-up    eval for access    HPI Linda [name] is a 23 y.o. female.  I am asked to see the patient by Dr. Marval Regal for evaluation of dialysis access.  She started dialysis about 3 to 4 months ago via a right internal jugular catheter which is working well.  She reports no major issues with the catheter.  No fevers or chills.  She is right-hand dominant.  She is familiar with dialysis as her mother has been on dialysis for many years.  Her dialysis dependent renal failure is likely secondary to hypertension and lupus.  She has previously had a vein mapping performed in [place] which demonstrated triphasic arterial waveforms throughout both upper extremities without significant disease.  Her cephalic vein was small in the forearm but of good size at the antecubital fossa and proximal and the nondominant left upper extremity.  She has had no previous dialysis access placements or creations other than her catheter.     Past Medical History:  Diagnosis Date   Acute kidney injury ([place])    Angioedema    Hypertension    Lupus ([place])    Migraines    Mixed connective tissue disease ([place])     Past Surgical History:  Procedure Laterality Date   I&D EXTREMITY Right 08/06/2017   Procedure: IRRIGATION AND DEBRIDEMENT KNEE;  Surgeon: Shona Needles, MD;  Location: [place];  Service: Orthopedics;  Laterality: Right;   MULTIPLE TOOTH EXTRACTIONS      Family History Family History  Problem Relation Age of Onset   Lupus Mother    Hypertension Mother    Kidney disease Mother    Diabetes Maternal Grandmother   No bleeding or clotting disorders  Social History Social History   Tobacco Use   Smoking status: Current Every Day Smoker    Packs/day: 0.00    Types: Cigarettes, Cigars    Start date: 02/11/2015   Smokeless tobacco: Never Used    Substance Use Topics   Alcohol use: No    Alcohol/week: 0.0 standard drinks    Comment: social   Drug use: No    Allergies  Allergen Reactions   Lisinopril Anaphylaxis    Angioedema   Vicodin [Hydrocodone-Acetaminophen] Itching and Rash    Current Outpatient Medications  Medication Sig Dispense Refill   acetaminophen-codeine (TYLENOL #3) 300-30 MG tablet Take 1 tablet by mouth 2 times a day as needed for pain     B Complex-C (B-COMPLEX WITH VITAMIN C) tablet Take 1 tablet by mouth daily.     carvedilol (COREG) 6.25 MG tablet Take 1 tablet (6.25 mg total) by mouth 2 (two) times daily with a meal. 60 tablet 3   diltiazem (CARDIZEM CD) 120 MG 24 hr capsule Take 120 mg by mouth daily.  2   metroNIDAZOLE (FLAGYL) 500 MG tablet Take 1 tablet (500 mg total) by mouth 2 (two) times daily. 14 tablet 2   multivitamin (RENA-VIT) TABS tablet      sevelamer (RENAGEL) 800 MG tablet Take 800 mg by mouth 3 (three) times daily.     spironolactone (ALDACTONE) 25 MG tablet Take 25 mg by mouth daily.  1   zolpidem (AMBIEN) 5 MG tablet Take 5 mg by mouth at bedtime as needed for sleep.   3   amLODipine (NORVASC) 10 MG tablet Take 1  tablet (10 mg total) by mouth daily. (Patient not taking: Reported on 08/22/2018) 90 tablet 3   chlorthalidone (HYGROTON) 50 MG tablet Take 1 tablet (50 mg total) by mouth daily. (Patient not taking: Reported on 10/06/2018) 30 tablet 0   cloNIDine (CATAPRES) 0.1 MG tablet Take 1 tablet (0.1 mg total) by mouth 3 (three) times daily. (Patient not taking: Reported on 10/06/2018) 270 tablet 3   oxyCODONE-acetaminophen (PERCOCET) 5-325 MG tablet Take 1 tablet by mouth every 4 (four) hours as needed for severe pain. (Patient not taking: Reported on 10/06/2018) 10 tablet 0   pantoprazole (PROTONIX) 40 MG tablet Take 1 tablet (40 mg total) by mouth 2 (two) times daily. 60 tablet 0   promethazine (PHENERGAN) 25 MG tablet Take 1 tablet (25 mg total) by mouth every 6 (six)  hours as needed for nausea. (Patient not taking: Reported on 10/06/2018) 30 tablet 0   valACYclovir (VALTREX) 1000 MG tablet Take 1 tablet twice a day for 3 days with each outbreak. (Patient not taking: Reported on 10/06/2018) 30 tablet 11   No current facility-administered medications for this visit.       REVIEW OF SYSTEMS (Negative unless checked)  Constitutional: [] Weight loss  [] Fever  [] Chills Cardiac: [] Chest pain   [] Chest pressure   [] Palpitations   [] Shortness of breath when laying flat   [] Shortness of breath at rest   [] Shortness of breath with exertion. Vascular:  [] Pain in legs with walking   [] Pain in legs at rest   [] Pain in legs when laying flat   [] Claudication   [] Pain in feet when walking  [] Pain in feet at rest  [] Pain in feet when laying flat   [] History of DVT   [] Phlebitis   [] Swelling in legs   [] Varicose veins   [] Non-healing ulcers Pulmonary:   [] Uses home oxygen   [] Productive cough   [] Hemoptysis   [] Wheeze  [] COPD   [] Asthma Neurologic:  [] Dizziness  [] Blackouts   [] Seizures   [] History of stroke   [] History of TIA  [] Aphasia   [] Temporary blindness   [] Dysphagia   [] Weakness or numbness in arms   [] Weakness or numbness in legs Musculoskeletal:  [] Arthritis   [] Joint swelling   [] Joint pain   [] Low back pain Hematologic:  [] Easy bruising  [] Easy bleeding   [] Hypercoagulable state   [x] Anemic  [] Hepatitis Gastrointestinal:  [] Blood in stool   [] Vomiting blood  [] Gastroesophageal reflux/heartburn   [x] Abdominal pain Genitourinary:  [x] Chronic kidney disease   [] Difficult urination  [] Frequent urination  [] Burning with urination   [] Hematuria Skin:  [] Rashes   [] Ulcers   [] Wounds Psychological:  [] History of anxiety   []  History of major depression.    Physical Exam BP 133/89 (BP Location: Right Arm)    Pulse 80    Resp 16    Ht 5\' 6"  (1.676 m)    Wt 166 lb (75.3 kg)    BMI 26.79 kg/m  Gen:  WD/WN, NAD Head: Greasy/AT, No temporalis wasting.  Ear/Nose/Throat: Hearing  grossly intact, nares w/o erythema or drainage, oropharynx w/o Erythema/Exudate Eyes: Conjunctiva clear, sclera non-icteric  Neck: trachea midline.  No JVD.  Pulmonary:  Good air movement, respirations not labored, no use of accessory muscles  Cardiac: RRR, no JVD Vascular:  Vessel Right Left  Radial Palpable Palpable  Gastrointestinal:. No masses, surgical incisions, or scars. Musculoskeletal: M/S 5/5 throughout.  Extremities without ischemic changes.  No deformity or atrophy.  No significant lower extremity edema. Neurologic: Sensation grossly intact in extremities.  Symmetrical.  Speech is fluent. Motor exam as listed above. Psychiatric: Judgment intact, Mood & affect appropriate for pt's clinical situation. Dermatologic: No rashes or ulcers noted.  No cellulitis or open wounds.    Radiology No results found.  Labs No results found for this or any previous visit (from the past 2160 hour(s)).  Assessment/Plan:  Primary hypertension Likely an underlying cause of her renal failure and blood pressure control important in reducing the progression of atherosclerotic disease. On appropriate oral medications.   Lupus erythematosus Likely an underlying cause of her renal failure.  ESRD on dialysis [facility]) Recommend:  At this time the patient does not have appropriate extremity access for dialysis  Patient should have a left brachiocephalic AVF created.  The risks, benefits and alternative therapies were reviewed in detail with the patient.  All questions were answered.  The patient agrees to proceed with surgery.  The differences between fistulas, graft, and catheters were all discussed.       Leotis Pain 01/10/2019, 11:48 AM   This note was created with Dragon medical transcription system.  Any errors from dictation are unintentional.

## 2019-01-10 NOTE — Assessment & Plan Note (Signed)
Recommend:  At this time the patient does not have appropriate extremity access for dialysis  Patient should have a left brachiocephalic AVF created.  The risks, benefits and alternative therapies were reviewed in detail with the patient.  All questions were answered.  The patient agrees to proceed with surgery.  The differences between fistulas, graft, and catheters were all discussed.

## 2019-01-10 NOTE — Assessment & Plan Note (Signed)
Likely an underlying cause of her renal failure and blood pressure control important in reducing the progression of atherosclerotic disease. On appropriate oral medications.

## 2019-01-10 NOTE — Patient Instructions (Signed)
AV Fistula Placement  Arteriovenous (AV) fistula placement is a surgical procedure to create a connection between a blood vessel that carries blood away from your heart (artery) and a blood vessel that returns blood to your heart (vein). This connection is called a fistula. It is often made in the forearm or upper arm. You may need this procedure if you are getting hemodialysis treatments for kidney disease. An AV fistula makes your vein larger and stronger over several months. This makes the vein a safe and easy spot to insert the needles that are used for hemodialysis. Tell a health care provider about:  Any allergies you have.  All medicines you are taking, including vitamins, herbs, eye drops, creams, and over-the-counter medicines.  Any problems you or family members have had with anesthetic medicines.  Any blood disorders you have.  Any surgeries you have had.  Any medical conditions you have or have had in the past. What are the risks? Generally, this is a safe procedure. However, problems may occur, including:  Infection.  Blood clot (thrombosis).  Reduced blood flow (stenosis).  Weakening or ballooning out of the fistula (aneurysm).  Bleeding.  Allergic reactions to medicines.  Nerve damage.  Swelling near the fistula (lymphedema).  Weakening of your heart (congestive heart failure).  Failure of the procedure. What happens before the procedure? Staying hydrated Follow instructions from your health care provider about hydration, which may include:  Up to 2 hours before the procedure - you may continue to drink clear liquids, such as water, clear fruit juice, black coffee, and plain tea.  Eating and drinking restrictions Follow instructions from your health care provider about eating and drinking, which may include:  8 hours before the procedure - stop eating heavy meals or foods, such as meat, fried foods, or fatty foods.  6 hours before the procedure - stop  eating light meals or foods, such as toast or cereal.  6 hours before the procedure - stop drinking milk or drinks that contain milk.  2 hours before the procedure - stop drinking clear liquids. Medicines Ask your health care provider about:  Changing or stopping your regular medicines. This is especially important if you are taking diabetes medicines or blood thinners.  Taking medicines such as aspirin and ibuprofen. These medicines can thin your blood. Do not take these medicines unless your health care provider tells you to take them.  Taking over-the-counter medicines, vitamins, herbs, and supplements. General instructions  Imaging tests of your arm may be done to find the best place for the fistula.  Plan to have someone take you home from the hospital or clinic.  Ask your health care provider how your surgical site will be marked or identified.  Ask your health care provider what steps will be taken to help prevent infection. These may include: ? Removing hair at the surgery site. ? Washing skin with a germ-killing soap. ? Taking antibiotic medicine.  Do not use any products that contain nicotine or tobacco for as long as possible before and after the procedure. These products include cigarettes, e-cigarettes, and chewing tobacco. If you need help quitting, ask your health care provider. What happens during the procedure?  An IV will be inserted into one of your veins.  You will be given one or more of the following: ? A medicine to help you relax (sedative). ? A medicine to numb the area (local anesthetic). ? A medicine to make you fall asleep (general anesthetic). ? A medicine that  be inserted into one of your veins.   You will be given one or more of the following:  ? A medicine to help you relax (sedative).  ? A medicine to numb the area (local anesthetic).  ? A medicine to make you fall asleep (general anesthetic).  ? A medicine that is injected into an area of your body to numb everything below the injection site (regional anesthetic).   The fistula site will be cleaned with a germ-killing solution (antiseptic).   An incision will be made on the inner side of your arm.   A vein and an artery will be opened and connected  with stitches (sutures).   The incision will be closed with sutures or clips.   A bandage (dressing) will be placed over the area.  The procedure may vary among health care providers and hospitals.  What happens after the procedure?   Your blood pressure, heart rate, breathing rate, and blood oxygen level may be monitored until you leave the hospital or clinic.   Your fistula site will be checked for bleeding or swelling.   You will be given pain medicine as needed.   Do not drive for 24 hours if you were given a sedative during your procedure.  Summary   Arteriovenous (AV) fistula placement is a surgical procedure to create a connection between a blood vessel that carries blood away from your heart (artery) and a blood vessel that returns blood to your heart (vein). This connection is called a fistula.   Follow instructions from your health care provider about eating and drinking before the procedure.   Ask your health care provider about changing or stopping your regular medicines before the procedure. This is especially important if you are taking diabetes medicines or blood thinners.   Do not drive for 24 hours if you were given a sedative during your procedure.   Plan to have someone take you home from the hospital or clinic.  This information is not intended to replace advice given to you by your health care provider. Make sure you discuss any questions you have with your health care provider.  Document Released: 07/29/2015 Document Revised: 02/21/2018 Document Reviewed: 02/21/2018  Elsevier Interactive Patient Education  2019 [employer]

## 2019-01-12 ENCOUNTER — Telehealth (INDEPENDENT_AMBULATORY_CARE_PROVIDER_SITE_OTHER): Payer: Self-pay

## 2019-01-12 NOTE — Telephone Encounter (Signed)
Spoke with the patient and explained about having cardiac clearance for surgery. Patient was asked if she had a preference for a cardiologist and the patient stated she did not.  A referral has been sent to [facility] for cardiac clearance and they will contact the patient to schedule. Patient was made aware of this and will be awaiting their call.

## 2019-01-16 ENCOUNTER — Telehealth: Payer: Self-pay | Admitting: Internal Medicine

## 2019-01-18 NOTE — Progress Notes (Deleted)
{Choose 1 Note Type (Telehealth Visit or Telephone Visit):[phone]}   Date:  01/18/2019   ID:  JOVANNI ECKHART, DOB [birth year], MRN [id number]  Patient Location: Home Provider Location: Home  PCP:  Reynolds Bing, DO  Cardiologist:  New - End Electrophysiologist:  None   Evaluation Performed:  Consultation - [name] was referred by Dr. Lucky Cowboy for preoperative cardiovascular risk assessment.  Chief Complaint:  ***  History of Present Illness:    Linda [name] is a 23 y.o. female with history of hypertension, lupus, and end-stage renal disease.  We are speaking today for preoperative cardiovascular risk assessment in anticipation of durable vascular access placement by vascular surgery.  The patient {does/does not:200015} have symptoms concerning for COVID-19 infection (fever, chills, cough, or new shortness of breath).    Past Medical History:  Diagnosis Date  . Acute kidney injury ([place])   . Angioedema   . Hypertension   . Lupus ([place])   . Migraines   . Mixed connective tissue disease [facility])    Past Surgical History:  Procedure Laterality Date  . I&D EXTREMITY Right 08/06/2017   Procedure: IRRIGATION AND DEBRIDEMENT KNEE;  Surgeon: Shona Needles, MD;  Location: [facility];  Service: Orthopedics;  Laterality: Right;  . MULTIPLE TOOTH EXTRACTIONS       No outpatient medications have been marked as taking for the 01/19/19 encounter (Appointment) with End, Harrell Gave, MD.     Allergies:   Lisinopril and Vicodin [hydrocodone-acetaminophen]   Social History   Tobacco Use  . Smoking status: Current Every Day Smoker    Packs/day: 0.00    Types: Cigarettes, Cigars    Start date: 02/11/2015  . Smokeless tobacco: Never Used  Substance Use Topics  . Alcohol use: No    Alcohol/week: 0.0 standard drinks    Comment: social  . Drug use: No     Family Hx: The patient's family history includes Diabetes in her maternal grandmother; Hypertension in her mother; Kidney  disease in her mother; Lupus in her mother.  ROS:   Please see the history of present illness.    *** All other systems reviewed and are negative.   Prior CV studies:   The following studies were reviewed today:  TTE (11/30/2017): Normal LV size.  Moderate LVH with LVEF of 55 and 55%.  Trivial aortic and mild mitral regurgitation.  Mild to moderate biatrial enlargement.  Normal RV size and function.  Small posterior pericardial effusion.  Labs/Other Tests and Data Reviewed:    EKG:  An ECG dated 08/22/2018 was personally reviewed today and demonstrated:  NSR with LVH and inferolateral ST/T changes.  Recent Labs: 03/02/2018: Magnesium 2.0 08/22/2018: ALT 6; BUN 7; Creatinine, Ser 3.51; Hemoglobin 9.0; Platelets 252; Potassium 3.0; Sodium 136   Recent Lipid Panel No results found for: CHOL, TRIG, HDL, CHOLHDL, LDLCALC, LDLDIRECT  Wt Readings from Last 3 Encounters:  01/10/19 166 lb (75.3 kg)  10/06/18 168 lb (76.2 kg)  10/04/18 168 lb (76.2 kg)     Objective:    Vital Signs:  There were no vitals taken for this visit.   {[facility] Virtual Exam (Optional):[phone]::"VITAL SIGNS:  reviewed"}  ASSESSMENT & PLAN:    1. ***  COVID-19 Education: The signs and symptoms of COVID-19 were discussed with the patient and how to seek care for testing (follow up with PCP or arrange E-visit).  ***The importance of social distancing was discussed today.  Time:   Today, I have spent *** minutes with  the patient with telehealth technology discussing the above problems.     Medication Adjustments/Labs and Tests Ordered: Current medicines are reviewed at length with the patient today.  Concerns regarding medicines are outlined above.   Tests Ordered: No orders of the defined types were placed in this encounter.   Medication Changes: No orders of the defined types were placed in this encounter.   Disposition:  Follow up {follow up:15908}  Signed, Nelva Bush, MD  01/18/2019  7:29 AM     [facility]

## 2019-01-19 ENCOUNTER — Ambulatory Visit: Payer: Medicaid Other | Admitting: Internal Medicine

## 2019-01-19 NOTE — Progress Notes (Deleted)
New Outpatient Visit Date: 01/19/2019  Referring Provider: Leotis Pain, MD [facility]  Chief Complaint: ***  HPI:  Ms. Linda [name] is a 23 y.o. female who is being seen today for preoperative cardiovascular risk assessment at the request of Dr. Lucky [name]. She has a history of hypertension, lupus, and end-stage renal disease.  ***  --------------------------------------------------------------------------------------------------  Cardiovascular History & Procedures: Cardiovascular Problems:  None  Risk Factors:  Hypertension  Cath/PCI:  None  CV Surgery:  None  EP Procedures and Devices:  None  Non-Invasive Evaluation(s):  TTE (11/30/2017): Normal LV size.  Moderate LVH with LVEF of 55 and 55%.  Trivial aortic and mild mitral regurgitation.  Mild to moderate biatrial enlargement.  Normal RV size and function.  Small posterior pericardial effusion.  Recent CV Pertinent Labs: Lab Results  Component Value Date   INR 1.01 01/25/2018   BNP 1,781.4 (H) 11/29/2017   K 3.0 (L) 08/22/2018   MG 2.0 03/02/2018   BUN 7 08/22/2018   BUN 12 06/08/2017   CREATININE 3.51 (H) 08/22/2018    --------------------------------------------------------------------------------------------------  Past Medical History:  Diagnosis Date  . Acute kidney injury ([place])   . Angioedema   . Hypertension   . Lupus ([place])   . Migraines   . Mixed connective tissue disease [facility])     Past Surgical History:  Procedure Laterality Date  . I&D EXTREMITY Right 08/06/2017   Procedure: IRRIGATION AND DEBRIDEMENT KNEE;  Surgeon: Linda Needles, MD;  Location: [place];  Service: Orthopedics;  Laterality: Right;  . MULTIPLE TOOTH EXTRACTIONS      No outpatient medications have been marked as taking for the 01/19/19 encounter (Appointment) with End, Linda Gave, MD.    Allergies: Lisinopril and Vicodin [hydrocodone-acetaminophen]  Social History   Tobacco Use  . Smoking status: Current Every  Day Smoker    Packs/day: 0.00    Types: Cigarettes, Cigars    Start date: 02/11/2015  . Smokeless tobacco: Never Used  Substance Use Topics  . Alcohol use: No    Alcohol/week: 0.0 standard drinks    Comment: social  . Drug use: No    Family History  Problem Relation Age of Onset  . Lupus Mother   . Hypertension Mother   . Kidney disease Mother   . Diabetes Maternal Grandmother     Review of Systems: A 12-system review of systems was performed and was negative except as noted in the HPI.  --------------------------------------------------------------------------------------------------  Physical Exam: There were no vitals taken for this visit.  General:  *** HEENT: No conjunctival pallor or scleral icterus. Moist mucous membranes. OP clear. Neck: Supple without lymphadenopathy, thyromegaly, JVD, or HJR. No carotid bruit. Lungs: Normal work of breathing. Clear to auscultation bilaterally without wheezes or crackles. Heart: Regular rate and rhythm without murmurs, rubs, or gallops. Non-displaced PMI. Abd: Bowel sounds present. Soft, NT/ND without hepatosplenomegaly Ext: No lower extremity edema. Radial, PT, and DP pulses are 2+ bilaterally Skin: Warm and dry without rash. Neuro: CNIII-XII intact. Strength and fine-touch sensation intact in upper and lower extremities bilaterally. Psych: Normal mood and affect.  EKG:  ***  Lab Results  Component Value Date   WBC 15.1 (H) 08/22/2018   HGB 9.0 (L) 08/22/2018   HCT 29.4 (L) 08/22/2018   MCV 98.0 08/22/2018   PLT 252 08/22/2018    Lab Results  Component Value Date   NA 136 08/22/2018   K 3.0 (L) 08/22/2018   CL 100 08/22/2018   CO2 26 08/22/2018  BUN 7 08/22/2018   CREATININE 3.51 (H) 08/22/2018   GLUCOSE 96 08/22/2018   ALT 6 08/22/2018    No results found for: CHOL, HDL, LDLCALC, LDLDIRECT, TRIG, CHOLHDL   --------------------------------------------------------------------------------------------------   ASSESSMENT AND PLAN: ***  Linda Bush, MD 01/19/2019 8:25 AM

## 2019-01-20 ENCOUNTER — Telehealth (INDEPENDENT_AMBULATORY_CARE_PROVIDER_SITE_OTHER): Payer: Medicaid Other | Admitting: Family Medicine

## 2019-01-20 ENCOUNTER — Other Ambulatory Visit: Payer: Self-pay

## 2019-01-20 DIAGNOSIS — R0789 Other chest pain: Secondary | ICD-10-CM | POA: Insufficient documentation

## 2019-01-20 DIAGNOSIS — R079 Chest pain, unspecified: Secondary | ICD-10-CM

## 2019-01-20 MED ORDER — OXYCODONE-ACETAMINOPHEN 5-325 MG PO TABS: 1.0000 | ORAL_TABLET | ORAL | 0 refills | Status: DC | PRN

## 2019-01-20 NOTE — Assessment & Plan Note (Signed)
Uncertain etiology.  Likely related to anxiety due to the recent passing of her mother who likely died from PE.  Patient has lupus and is therefore hypercoagulable.  She does have several risk factors but no known history of MI.  Symptoms less consistent with reflux.  This may be MSK related due to exacerbation with deep inspiration, however PE is definitely a concern.  She is without viral URI symptoms.  I spoke at length about the importance of being evaluated in the emergency room, however patient would like to defer evaluation until after her mother's funeral on 01/21/2019.  She does have chronic calciphylaxis and is requesting pain medication to help get her through the next several days as she awaits an appointment with her pain clinic.  She is not a threat to self or others.  She is going through the expected grieving process. - Reviewed reasons to call 911 or go to the emergency room - Given electronic refill for Percocet, #15 tabs, no refills and instructed to contact [facility] to schedule appointment - Will follow-up on Monday by phone

## 2019-01-20 NOTE — Progress Notes (Signed)
[facility] Tele[facility]icine Visit  Patient consented to have virtual visit. Method of visit: Video  Encounter participants: Patient: Linda [name] - located at home Provider: [place] Bing - located at office  Chief Complaint: "shortness of breath"  HPI:  Ms. Ventola is well-known to me and follows [facility] for her lupus and mixed tissue disease.  She is ESRD and is scheduled to see a cardiologist for cardiac clearance in the upcoming weeks.  Patient reports 4 days of right breast pain especially with deep breathing.  This is worse as the exertion improves with rest.  She feels that she had similar pain in the past.  She has not tried medications to alleviate her pain.  She denies diaphoresis but does report some nausea and vomiting over the last few days.  Of note, she attributes most of her symptoms to the unexpected passing of her mother a few days ago around the time of onset.  Her mother has lupus 2 and was recently hospitalized for pulmonary embolism which is suspected to be the source of her death.  Patient is worried she may have one too, however she does not have any diagnosis of VTE.  We spoke at length about the importance of being evaluated in the emergency room, however patient states she has a funeral for her mother tomorrow and would like to avoid going to the emergency room until after.  She is currently staying with her friend while her grandmother is staying at her house.  Patient is grieving but denies suicidal or homicidal ideation.  She feels safe at her friend's house and has support with her grandmother.  She is working on setting up therapy with her family as we speak.  ROS: per HPI  Pertinent PMHx: Reviewed  Exam:  General: Tearful and upset Respiratory: Speaking in complete sentences without increased work of breathing  Assessment/Plan:  Chest pain Uncertain etiology.  Likely related to anxiety due to the recent passing of her  mother who likely died from PE.  Patient has lupus and is therefore hypercoagulable.  She does have several risk factors but no known history of MI.  Symptoms less consistent with reflux.  This may be MSK related due to exacerbation with deep inspiration, however PE is definitely a concern.  She is without viral URI symptoms.  I spoke at length about the importance of being evaluated in the emergency room, however patient would like to defer evaluation until after her mother's funeral on 01/21/2019.  She does have chronic calciphylaxis and is requesting pain medication to help get her through the next several days as she awaits an appointment with her pain clinic.  She is not a threat to self or others.  She is going through the expected grieving process. - Reviewed reasons to call 911 or go to the emergency room - Given electronic refill for Percocet, #15 tabs, no refills and instructed to contact [facility] to schedule appointment - Will follow-up on Monday by phone    Time spent during visit with patient: 18 minutes

## 2019-01-24 ENCOUNTER — Telehealth: Payer: Self-pay | Admitting: Family Medicine

## 2019-01-24 NOTE — Telephone Encounter (Signed)
Thank you for the update.  Please have the patient contact our office to schedule an appointment at her convenience [phone]), as she was a no-show for her visit with me last week.  Nelva Bush, MD [facility] Pager: [phone]

## 2019-01-24 NOTE — Telephone Encounter (Signed)
Return follow-up call regarding sharp intermittent right sided chest pain for the last 10 days.  Pain has improved approximately 70% according to patient and significantly improved with use of oxycodone.  Pain was initially worse with exertion improved with rest, however patient feels she has no limitations to her activity at this time.  She denies diaphoresis and her nausea and vomiting have resolved.  She is still waiting to follow-up with cardiology since referral by her vascular surgeon for access given her history of ESRD secondary to lupus.  It appears patient had my chart message approximately 1 week ago but failed to follow-up.  I advised her to contact the [facility] office and request to reschedule appointment to discuss referral and improving right-sided chest pain.  Overall, no red flags and chest pain likely musculoskeletal, however given patient's initial pain characteristic and history of lupus, would defer to cardiology for recommendations.  Discussed reasons to go to emergency room.  Informed patient her mother's death certificate has been signed and will be sent to the funeral home.  We discussed pulmonary embolism is likely what caused her mother's death and is less likely what is causing her current right-sided chest pain.  Harriet [place], [place], PGY-3

## 2019-02-28 ENCOUNTER — Telehealth: Payer: Self-pay

## 2019-02-28 NOTE — Telephone Encounter (Signed)
    COVID-19 Pre-Screening Questions:  . In the past 7 to 10 days have you had a cough, shortness of breath, headache, congestion, fever (100 or greater), body aches, chills, sore throat, or sudden loss of taste or sense of smell? NO . Have you been around anyone with known Covid 19? NO . Have you been around anyone who is awaiting Covid 19 test results in the past 7 to 10 days? NO . Have you been around anyone who has been exposed to Covid 19, or has mentioned symptoms of Covid 19 within the past 7 to 10 days? NO  If you have any concerns/questions about symptoms patients report during screening (either on the phone or at threshold). Contact the provider seeing the patient or DOD for further guidance.  If neither are available contact a member of the leadership team.            

## 2019-02-28 NOTE — Progress Notes (Signed)
New Outpatient Visit Date: 03/01/2019  Referring Provider: Leotis Pain, MD [facility]  Chief Complaint: Preop evaluation  HPI:  Linda [name] is a 23 y.o. female who is being seen today for the evaluation of chest pain and preop clearance at the request of Dr. Lucky Cowboy. She has a history of lupus and mixed connective tissue disease complicated by -stage renal disease, hypertension, and angioedema.  She saw Dr. Lucky Cowboy in mid May and is planning to undergo AV fistula creation.  Today, Linda [name] reports feeling relatively well other than intermittent pain in the right upper chest near her PermCath site.  This was initially worse after PermCath placement and seem to be exacerbated by movement of the right arm.  However, this has gradually improved.  She has otherwise not had any chest pain nor shortness of breath, palpitations, lightheadedness, orthopnea, or edema.  She is tolerating hemodialysis sessions well.  She has been under quite a bit of stress following the death of her mother a month ago.  Linda [name] had a complicated medical course last year leading to several hospitalizations.  Most of her care occurred at [facility], where she was admitted with altered mental status and acute kidney injury.  She ultimately required initiation of hemodialysis.  Several echocardiograms were performed, most recently in August, demonstrating normal LVEF with mild LVH, restrictive physiology, and moderate pulmonary hypertension.  It does not appear that she ever underwent ischemia evaluation.  She denies a history of prior heart disease but notes that her mother, who also had lupus, required stent placement in her mid-late 30s.  --------------------------------------------------------------------------------------------------  Cardiovascular History & Procedures: Cardiovascular Problems:  HFpEF with restrictive physiology on echo  Pericardial effusion  Risk Factors:  Hypertension, lupus,  family history, and tobacco use  Cath/PCI:  None  CV Surgery:  None  EP Procedures and Devices:  None  Non-Invasive Evaluation(s):  TTE (04/13/2018, [facility]): Normal LV size with mild LVH.  LVEF 55-60% with restrictive filling pattern.  Normal RV size and function.  Severe left atrial enlargement.  Mild mitral and mild to moderate tricuspid regurgitation.  Moderate pulmonary hypertension.  Mild to moderate pulmonic regurgitation.  Trivial pericardial effusion.  TTE (11/30/2017): Normal LV size with moderate LVH.  LVEF 50-55% with normal diastolic function.  Trivial aortic regurgitation.  Mild mitral regurgitation.  Moderate left and mild right atrial enlargement.  Normal RV size and function.  Small posterior pericardial effusion without tamponade physiology.  Recent CV Pertinent Labs: Lab Results  Component Value Date   INR 1.01 01/25/2018   BNP 1,781.4 (H) 11/29/2017   K 3.0 (L) 08/22/2018   MG 2.0 03/02/2018   BUN 7 08/22/2018   BUN 12 06/08/2017   CREATININE 3.51 (H) 08/22/2018    --------------------------------------------------------------------------------------------------  Past Medical History:  Diagnosis Date  . Acute kidney injury ([place])   . Angioedema   . Hypertension   . Lupus ([place])   . Migraines   . Mixed connective tissue disease [facility])     Past Surgical History:  Procedure Laterality Date  . I&D EXTREMITY Right 08/06/2017   Procedure: IRRIGATION AND DEBRIDEMENT KNEE;  Surgeon: Shona Needles, MD;  Location: [place];  Service: Orthopedics;  Laterality: Right;  . MULTIPLE TOOTH EXTRACTIONS      Current Meds  Medication Sig  . B Complex-C (B-COMPLEX WITH VITAMIN C) tablet Take 1 tablet by mouth daily.  . carvedilol (COREG) 6.25 MG tablet Take 1 tablet (6.25 mg total) by mouth 2 (  two) times daily with a meal.  . oxyCODONE-acetaminophen (PERCOCET) 5-325 MG tablet Take 1 tablet by mouth every 4 (four) hours as needed for severe pain.  . pantoprazole  (PROTONIX) 40 MG tablet Take 1 tablet (40 mg total) by mouth 2 (two) times daily.  . promethazine (PHENERGAN) 25 MG tablet Take 1 tablet (25 mg total) by mouth every 6 (six) hours as needed for nausea.  . sevelamer (RENAGEL) 800 MG tablet Take 800 mg by mouth 3 (three) times daily.  Marland Kitchen spironolactone (ALDACTONE) 25 MG tablet Take 25 mg by mouth daily.  . valACYclovir (VALTREX) 1000 MG tablet Take 1 tablet twice a day for 3 days with each outbreak.  . zolpidem (AMBIEN) 5 MG tablet Take 5 mg by mouth at bedtime as needed for sleep.     Allergies: Lisinopril and Vicodin [hydrocodone-acetaminophen]  Social History   Tobacco Use  . Smoking status: Current Every Day Smoker    Packs/day: 0.00    Years: 5.00    Pack years: 0.00    Types: Cigarettes, Cigars    Start date: 02/11/2015  . Smokeless tobacco: Never Used  Substance Use Topics  . Alcohol use: No    Alcohol/week: 0.0 standard drinks    Comment: social  . Drug use: No    Family History  Problem Relation Age of Onset  . Lupus Mother   . Hypertension Mother   . Kidney disease Mother   . Heart Problems Mother   . Diabetes Maternal Grandmother     Review of Systems: A 12-system review of systems was performed and was negative except as noted in the HPI.  --------------------------------------------------------------------------------------------------  Physical Exam: BP (!) 158/118 (BP Location: Left Arm, Patient Position: Sitting, Cuff Size: Normal)   Pulse 75   Ht 5\' 6"  (1.676 m)   Wt 164 lb (74.4 kg)   BMI 26.47 kg/m   General:  NAD HEENT: No conjunctival pallor or scleral icterus. Facemask in place Neck: Supple without lymphadenopathy, thyromegaly, JVD, or HJR. No carotid bruit. Lungs: Normal work of breathing. Clear to auscultation bilaterally without wheezes or crackles. Heart: Regular rate and rhythm without murmurs, rubs, or gallops. Non-displaced PMI. Abd: Bowel sounds present. Soft, NT/ND without  hepatosplenomegaly Ext: No lower extremity edema. Radial, PT, and DP pulses are 2+ bilaterally Skin: Warm and dry. Neuro: Grossly intact without focal deficit. Psych: Initial flat affect but more engaged as visit progresses  EKG:  Normal sinus rhythm with LVH and inferolateral T-wave inversions.  Lab Results  Component Value Date   WBC 15.1 (H) 08/22/2018   HGB 9.0 (L) 08/22/2018   HCT 29.4 (L) 08/22/2018   MCV 98.0 08/22/2018   PLT 252 08/22/2018    Lab Results  Component Value Date   NA 136 08/22/2018   K 3.0 (L) 08/22/2018   CL 100 08/22/2018   CO2 26 08/22/2018   BUN 7 08/22/2018   CREATININE 3.51 (H) 08/22/2018   GLUCOSE 96 08/22/2018   ALT 6 08/22/2018    No results found for: CHOL, HDL, LDLCALC, LDLDIRECT, TRIG, CHOLHDL   --------------------------------------------------------------------------------------------------  ASSESSMENT AND PLAN: Chest pain, abnormal EKG, and preop risk assessment: Pain is localized around tunneled dialysis catheter and is not consistent with angina.  However, EKG is notable for inferolateral T-wave inversions that are not new but have become more pronounced on serial EKG's since 2018.  These findings are most consistent with LVH and abnormal repolarization.  The patient's young age suggests a low risk of obstructive coronary  artery disease.  However, given elevated risk for premature CAD in the setting of chronic inflammatory conditions such as lupus, as well as her mother's history of PCI in he mid-late 30's, I think it would be prudent to perform a pharmacologic myocardial perfusion stress test to exclude high-risk ischemia.  If this is unrevealing, I think it would be reasonable to proceed with AV fistula creation at the patient's convenience.  HFpEF: Linda [name] appears euvolemic and does not express symptoms of decompensated heart failure.  She should continue her current medications and manage her volume through hemodialysis.   Hypertension: BP quite elevated today but typically normal.  I suspect that stress surrounding today's visit has contributed to this.  I will defer medication changes today.  Follow-up: Return to clinic in 3 months.  Nelva Bush, MD 03/02/2019 10:01 AM

## 2019-03-01 ENCOUNTER — Encounter: Payer: Self-pay | Admitting: Internal Medicine

## 2019-03-01 ENCOUNTER — Other Ambulatory Visit: Payer: Self-pay

## 2019-03-01 ENCOUNTER — Ambulatory Visit (INDEPENDENT_AMBULATORY_CARE_PROVIDER_SITE_OTHER): Payer: Medicare Other | Admitting: Internal Medicine

## 2019-03-01 VITALS — BP 158/118 | HR 75 | Ht 66.0 in | Wt 164.0 lb

## 2019-03-01 DIAGNOSIS — Z01810 Encounter for preprocedural cardiovascular examination: Secondary | ICD-10-CM

## 2019-03-01 DIAGNOSIS — R0789 Other chest pain: Secondary | ICD-10-CM | POA: Diagnosis not present

## 2019-03-01 DIAGNOSIS — R9431 Abnormal electrocardiogram [ECG] [EKG]: Secondary | ICD-10-CM

## 2019-03-01 DIAGNOSIS — I5032 Chronic diastolic (congestive) heart failure: Secondary | ICD-10-CM

## 2019-03-01 DIAGNOSIS — I10 Essential (primary) hypertension: Secondary | ICD-10-CM

## 2019-03-01 NOTE — Patient Instructions (Signed)
Medication Instructions:  Your physician recommends that you continue on your current medications as directed. Please refer to the Current Medication list given to you today.  If you need a refill on your cardiac medications before your next appointment, please call your pharmacy.   Lab work: - None ordered.  If you have labs (blood work) drawn today and your tests are completely normal, you will receive your results only by: Marland Kitchen MyChart Message (if you have MyChart) OR . A paper copy in the mail If you have any lab test that is abnormal or we need to change your treatment, we will call you to review the results.  Testing/Procedures: Your physician has requested that you have a lexiscan myoview. For further information please visit [email or web address]. Please follow instruction sheet, as given.  Linda [name]  Your caregiver has ordered a Stress Test with nuclear imaging. The purpose of this test is to evaluate the blood supply to your heart muscle. This procedure is referred to as a "Non-Invasive Stress Test." This is because other than having an IV started in your vein, nothing is inserted or "invades" your body. Cardiac stress tests are done to find areas of poor blood flow to the heart by determining the extent of coronary artery disease (CAD). Some patients exercise on a treadmill, which naturally increases the blood flow to your heart, while others who are  unable to walk on a treadmill due to physical limitations have a pharmacologic/chemical stress agent called Lexiscan . This medicine will mimic walking on a treadmill by temporarily increasing your coronary blood flow.   Please note: these test may take anywhere between 2-4 hours to complete  PLEASE REPORT TO [facility] AT THE FIRST DESK WILL DIRECT YOU WHERE TO GO  Date of Procedure:_____________________________________  Arrival Time for Procedure:______________________________  Instructions  regarding medication:   _XX_:  Hold betablocker(s) night before procedure and morning of procedure - CARVEDILOL   PLEASE NOTIFY THE OFFICE AT LEAST 24 HOURS IN ADVANCE IF YOU ARE UNABLE TO KEEP YOUR APPOINTMENT.  [phone] AND  PLEASE NOTIFY NUCLEAR MEDICINE AT [facility] AT LEAST 24 HOURS IN ADVANCE IF YOU ARE UNABLE TO KEEP YOUR APPOINTMENT. [phone]  How to prepare for your Myoview test:  1. Do not eat or drink after midnight 2. No caffeine for 24 hours prior to test 3. No smoking 24 hours prior to test. 4. Your medication may be taken with water.  If your doctor stopped a medication because of this test, do not take that medication. 5. Ladies, please do not wear dresses.  Skirts or pants are appropriate. Please wear a short sleeve shirt. 6. No perfume, cologne or lotion. 7. Wear comfortable walking shoes. No heels!    Follow-Up: At [facility], you and your health needs are our priority.  As part of our continuing mission to provide you with exceptional heart care, we have created designated Provider Care Teams.  These Care Teams include your primary Cardiologist (physician) and Advanced Practice Providers (APPs -  Physician Assistants and Nurse Practitioners) who all work together to provide you with the care you need, when you need it. You will need a follow up appointment in 3 months.  Please call our office 2 months in advance to schedule this appointment.  You may see DR Harrell Gave END or one of the following Advanced Practice Providers on your designated Care Team:   Murray Hodgkins, NP Christell Faith, PA-C . Marrianne Mood, PA-C  Cardiac Nuclear Scan A cardiac nuclear scan is a test that measures blood flow to the heart when a person is resting and when he or she is exercising. The test looks for problems such as:  Not enough blood reaching a portion of the heart.  The heart muscle not working normally. You may need this test if:  You have heart disease.   You have had abnormal lab results.  You have had heart surgery or a balloon procedure to open up blocked arteries (angioplasty).  You have chest pain.  You have shortness of breath. In this test, a radioactive dye (tracer) is injected into your bloodstream. After the tracer has traveled to your heart, an imaging device is used to measure how much of the tracer is absorbed by or distributed to various areas of your heart. This procedure is usually done at a hospital and takes 2-4 hours. Tell a health care provider about:  Any allergies you have.  All medicines you are taking, including vitamins, herbs, eye drops, creams, and over-the-counter medicines.  Any problems you or family members have had with anesthetic medicines.  Any blood disorders you have.  Any surgeries you have had.  Any medical conditions you have.  Whether you are pregnant or may be pregnant. What are the risks? Generally, this is a safe procedure. However, problems may occur, including:  Serious chest pain and heart attack. This is only a risk if the stress portion of the test is done.  Rapid heartbeat.  Sensation of warmth in your chest. This usually passes quickly.  Allergic reaction to the tracer. What happens before the procedure?  Ask your health care provider about changing or stopping your regular medicines. This is especially important if you are taking diabetes medicines or blood thinners.  Follow instructions from your health care provider about eating or drinking restrictions.  Remove your jewelry on the day of the procedure. What happens during the procedure?  An IV will be inserted into one of your veins.  Your health care provider will inject a small amount of radioactive tracer through the IV.  You will wait for 20-40 minutes while the tracer travels through your bloodstream.  Your heart activity will be monitored with an electrocardiogram (ECG).  You will lie down on an exam table.   Images of your heart will be taken for about 15-20 minutes.  You may also have a stress test. For this test, one of the following may be done: ? You will exercise on a treadmill or stationary bike. While you exercise, your heart's activity will be monitored with an ECG, and your blood pressure will be checked. ? You will be given medicines that will increase blood flow to parts of your heart. This is done if you are unable to exercise.  When blood flow to your heart has peaked, a tracer will again be injected through the IV.  After 20-40 minutes, you will get back on the exam table and have more images taken of your heart.  Depending on the type of tracer used, scans may need to be repeated 3-4 hours later.  Your IV line will be removed when the procedure is over. The procedure may vary among health care providers and hospitals. What happens after the procedure?  Unless your health care provider tells you otherwise, you may return to your normal schedule, including diet, activities, and medicines.  Unless your health care provider tells you otherwise, you may increase your fluid intake. This will  help to flush the contrast dye from your body. Drink enough fluid to keep your urine pale yellow.  Ask your health care provider, or the department that is doing the test: ? When will my results be ready? ? How will I get my results? Summary  A cardiac nuclear scan measures the blood flow to the heart when a person is resting and when he or she is exercising.  Tell your health care provider if you are pregnant.  Before the procedure, ask your health care provider about changing or stopping your regular medicines. This is especially important if you are taking diabetes medicines or blood thinners.  After the procedure, unless your health care provider tells you otherwise, increase your fluid intake. This will help flush the contrast dye from your body.  After the procedure, unless your  health care provider tells you otherwise, you may return to your normal schedule, including diet, activities, and medicines. This information is not intended to replace advice given to you by your health care provider. Make sure you discuss any questions you have with your health care provider. Document Released: 09/11/2004 Document Revised: 01/31/2018 Document Reviewed: 01/31/2018 Elsevier Patient Education  2020 [employer].

## 2019-03-02 ENCOUNTER — Encounter: Payer: Self-pay | Admitting: Internal Medicine

## 2019-03-02 DIAGNOSIS — I5032 Chronic diastolic (congestive) heart failure: Secondary | ICD-10-CM | POA: Insufficient documentation

## 2019-03-02 DIAGNOSIS — R9431 Abnormal electrocardiogram [ECG] [EKG]: Secondary | ICD-10-CM | POA: Insufficient documentation

## 2019-03-06 ENCOUNTER — Other Ambulatory Visit: Payer: Self-pay | Admitting: *Deleted

## 2019-03-06 DIAGNOSIS — Z01810 Encounter for preprocedural cardiovascular examination: Secondary | ICD-10-CM

## 2019-03-06 DIAGNOSIS — R0789 Other chest pain: Secondary | ICD-10-CM

## 2019-03-07 ENCOUNTER — Other Ambulatory Visit
Admission: RE | Admit: 2019-03-07 | Discharge: 2019-03-07 | Disposition: A | Discharge status: Home / Self Care | Payer: Medicare Other | Attending: Internal Medicine | Admitting: Internal Medicine

## 2019-03-07 ENCOUNTER — Encounter
Admission: RE | Admit: 2019-03-07 | Discharge: 2019-03-07 | Disposition: A | Discharge status: Home / Self Care | Payer: Medicare Other | Source: Ambulatory Visit | Attending: Internal Medicine | Admitting: Internal Medicine

## 2019-03-07 ENCOUNTER — Other Ambulatory Visit: Payer: Self-pay

## 2019-03-07 DIAGNOSIS — N289 Disorder of kidney and ureter, unspecified: Secondary | ICD-10-CM

## 2019-03-07 DIAGNOSIS — R0789 Other chest pain: Secondary | ICD-10-CM | POA: Insufficient documentation

## 2019-03-07 DIAGNOSIS — Z01810 Encounter for preprocedural cardiovascular examination: Secondary | ICD-10-CM | POA: Insufficient documentation

## 2019-03-07 DIAGNOSIS — R9431 Abnormal electrocardiogram [ECG] [EKG]: Secondary | ICD-10-CM | POA: Diagnosis present

## 2019-03-07 HISTORY — DX: Disorder of kidney and ureter, unspecified: N28.9

## 2019-03-07 LAB — NM MYOCAR MULTI W/SPECT W/WALL MOTION / EF
LV dias vol: 123 mL (ref 46–106)
LV sys vol: 64 mL
MPHR: 197 {beats}/min
Peak HR: 98 {beats}/min
Percent HR: 49 %
Rest HR: 64 {beats}/min
TID: 0.92

## 2019-03-07 LAB — PREGNANCY, URINE: Preg Test, Ur: NEGATIVE

## 2019-03-07 MED ORDER — TECHNETIUM TC 99M TETROFOSMIN IV KIT
10.0000 | PACK | Freq: Once | INTRAVENOUS | Status: AC | PRN
  Administered 2019-03-07: 10.037 via INTRAVENOUS

## 2019-03-07 MED ORDER — REGADENOSON 0.4 MG/5ML IV SOLN
0.4000 mg | Freq: Once | INTRAVENOUS | Status: AC
  Administered 2019-03-07: 0.4 mg via INTRAVENOUS

## 2019-03-07 MED ORDER — TECHNETIUM TC 99M TETROFOSMIN IV KIT
30.0000 | PACK | Freq: Once | INTRAVENOUS | Status: AC | PRN
  Administered 2019-03-07: 30.093 via INTRAVENOUS

## 2019-03-08 ENCOUNTER — Telehealth (INDEPENDENT_AMBULATORY_CARE_PROVIDER_SITE_OTHER): Payer: Self-pay

## 2019-03-08 ENCOUNTER — Telehealth: Payer: Self-pay

## 2019-03-08 NOTE — Telephone Encounter (Signed)
Pt made aware of stress test results and Dr.End's recommendation. Pt verbalized understanding and voiced appreciation for the call.

## 2019-03-08 NOTE — Telephone Encounter (Signed)
-----   Message from Nelva Bush, MD sent at 03/08/2019  7:07 AM EDT ----- Please let Ms. Westerhold know that her stress test does not show evidence of a blockage.  I think it is reasonable for her to proceed with AV fistula placement, as planned, with Dr. Lucky Cowboy.

## 2019-03-08 NOTE — Telephone Encounter (Signed)
Spoke with the patient and she is scheduled to have surgery on 03/16/2019 and her Covid-19 testing will be on 03/13/2019 @ 1:45pm. Patient is aware of the zero visitor policy. This information will be mailed out to the patient.

## 2019-03-13 ENCOUNTER — Other Ambulatory Visit (INDEPENDENT_AMBULATORY_CARE_PROVIDER_SITE_OTHER): Payer: Self-pay | Admitting: Nurse Practitioner

## 2019-03-13 ENCOUNTER — Encounter (INDEPENDENT_AMBULATORY_CARE_PROVIDER_SITE_OTHER): Payer: Self-pay

## 2019-03-13 ENCOUNTER — Inpatient Hospital Stay: Admission: RE | Admit: 2019-03-13 | Payer: Medicare Other | Source: Ambulatory Visit

## 2019-03-13 ENCOUNTER — Telehealth (INDEPENDENT_AMBULATORY_CARE_PROVIDER_SITE_OTHER): Payer: Self-pay

## 2019-03-13 NOTE — Telephone Encounter (Signed)
Spoke with the patient and let her know she will need to be rescheduled due to Dr. Lucky Cowboy not being able to do surgery. Patient was okay with that and she has been rescheduled to 03/30/2019 with Dr. Lucky Cowboy and her Covid testing and pre-op has been rescheduled to 03/27/2019 with a 1:45 pm arrival time. This information will be faxed to [facility] for the patient.

## 2019-03-19 ENCOUNTER — Other Ambulatory Visit (INDEPENDENT_AMBULATORY_CARE_PROVIDER_SITE_OTHER): Payer: Self-pay | Admitting: Nurse Practitioner

## 2019-03-27 ENCOUNTER — Other Ambulatory Visit: Payer: Self-pay

## 2019-03-27 ENCOUNTER — Encounter
Admission: RE | Admit: 2019-03-27 | Discharge: 2019-03-27 | Disposition: A | Discharge status: Home / Self Care | Payer: Medicare Other | Source: Ambulatory Visit | Attending: Vascular Surgery | Admitting: Vascular Surgery

## 2019-03-27 DIAGNOSIS — K219 Gastro-esophageal reflux disease without esophagitis: Secondary | ICD-10-CM | POA: Diagnosis not present

## 2019-03-27 DIAGNOSIS — Z992 Dependence on renal dialysis: Secondary | ICD-10-CM | POA: Diagnosis not present

## 2019-03-27 DIAGNOSIS — Z01812 Encounter for preprocedural laboratory examination: Secondary | ICD-10-CM | POA: Insufficient documentation

## 2019-03-27 DIAGNOSIS — R9431 Abnormal electrocardiogram [ECG] [EKG]: Secondary | ICD-10-CM | POA: Insufficient documentation

## 2019-03-27 DIAGNOSIS — I120 Hypertensive chronic kidney disease with stage 5 chronic kidney disease or end stage renal disease: Secondary | ICD-10-CM | POA: Diagnosis not present

## 2019-03-27 DIAGNOSIS — F17290 Nicotine dependence, other tobacco product, uncomplicated: Secondary | ICD-10-CM | POA: Diagnosis not present

## 2019-03-27 DIAGNOSIS — Z841 Family history of disorders of kidney and ureter: Secondary | ICD-10-CM | POA: Diagnosis not present

## 2019-03-27 DIAGNOSIS — Z1159 Encounter for screening for other viral diseases: Secondary | ICD-10-CM | POA: Insufficient documentation

## 2019-03-27 DIAGNOSIS — Z20828 Contact with and (suspected) exposure to other viral communicable diseases: Secondary | ICD-10-CM | POA: Diagnosis not present

## 2019-03-27 DIAGNOSIS — N186 End stage renal disease: Secondary | ICD-10-CM | POA: Insufficient documentation

## 2019-03-27 DIAGNOSIS — Z79899 Other long term (current) drug therapy: Secondary | ICD-10-CM | POA: Diagnosis not present

## 2019-03-27 DIAGNOSIS — F17210 Nicotine dependence, cigarettes, uncomplicated: Secondary | ICD-10-CM | POA: Diagnosis not present

## 2019-03-27 DIAGNOSIS — L930 Discoid lupus erythematosus: Secondary | ICD-10-CM | POA: Diagnosis not present

## 2019-03-27 HISTORY — DX: Dependence on renal dialysis: Z99.2

## 2019-03-27 HISTORY — DX: Gastro-esophageal reflux disease without esophagitis: K21.9

## 2019-03-27 LAB — CBC WITH DIFFERENTIAL/PLATELET
Abs Immature Granulocytes: 0.02 10*3/uL (ref 0.00–0.07)
Basophils Absolute: 0 10*3/uL (ref 0.0–0.1)
Basophils Relative: 1 %
Eosinophils Absolute: 0.4 10*3/uL (ref 0.0–0.5)
Eosinophils Relative: 4 %
HCT: 39 % (ref 36.0–46.0)
Hemoglobin: 12.5 g/dL (ref 12.0–15.0)
Immature Granulocytes: 0 %
Lymphocytes Relative: 25 %
Lymphs Abs: 2.1 10*3/uL (ref 0.7–4.0)
MCH: 30.9 pg (ref 26.0–34.0)
MCHC: 32.1 g/dL (ref 30.0–36.0)
MCV: 96.5 fL (ref 80.0–100.0)
Monocytes Absolute: 0.8 10*3/uL (ref 0.1–1.0)
Monocytes Relative: 10 %
Neutro Abs: 5 10*3/uL (ref 1.7–7.7)
Neutrophils Relative %: 60 %
Platelets: 157 10*3/uL (ref 150–400)
RBC: 4.04 MIL/uL (ref 3.87–5.11)
RDW: 13.5 % (ref 11.5–15.5)
WBC: 8.4 10*3/uL (ref 4.0–10.5)
nRBC: 0 % (ref 0.0–0.2)

## 2019-03-27 LAB — PROTIME-INR
INR: 1 (ref 0.8–1.2)
Prothrombin Time: 13 seconds (ref 11.4–15.2)

## 2019-03-27 LAB — BASIC METABOLIC PANEL
Anion gap: 14 (ref 5–15)
BUN: 46 mg/dL — ABNORMAL HIGH (ref 6–20)
CO2: 25 mmol/L (ref 22–32)
Calcium: 9.5 mg/dL (ref 8.9–10.3)
Chloride: 98 mmol/L (ref 98–111)
Creatinine, Ser: 3.69 mg/dL — ABNORMAL HIGH (ref 0.44–1.00)
GFR calc Af Amer: 19 mL/min — ABNORMAL LOW (ref >60)
GFR calc non Af Amer: 16 mL/min — ABNORMAL LOW (ref >60)
Glucose, Bld: 78 mg/dL (ref 70–99)
Potassium: 3.5 mmol/L (ref 3.5–5.1)
Sodium: 137 mmol/L (ref 135–145)

## 2019-03-27 LAB — APTT: aPTT: 32 seconds (ref 24–36)

## 2019-03-27 NOTE — Patient Instructions (Addendum)
Your procedure is scheduled on: 03-30-19 THURSDAY Report to Same Day Surgery 2nd floor medical mall [facility] Entrance-take elevator on left to 2nd floor.  Check in with surgery information desk.) To find out your arrival time please call [phone] between 1PM - 3PM on 03-29-19 [facility]  Remember: Instructions that are not followed completely may result in serious medical risk, up to and including death, or upon the discretion of your surgeon and anesthesiologist your surgery may need to be rescheduled.    _x___ 1. Do not eat food after midnight the night before your procedure. NO GUM OR CANDY AFTER MIDNIGHT. You may drink clear liquids up to 2 hours before you are scheduled to arrive at the hospital for your procedure.  Do not drink clear liquids within 2 hours of your scheduled arrival to the hospital.  Clear liquids include  --Water or Apple juice without pulp  --Clear carbohydrate beverage such as ClearFast or Gatorade  --Black Coffee or Clear Tea (No milk, no creamers, do not add anything to the coffee or Tea   ____Ensure clear carbohydrate drink on the way to the hospital for bariatric patients  ____Ensure clear carbohydrate drink 3 hours before surgery.     __x__ 2. No Alcohol for 24 hours before or after surgery.   __x__3. No Smoking or e-cigarettes for 24 prior to surgery.  Do not use any chewable tobacco products for at least 6 hour prior to surgery   ____  4. Bring all medications with you on the day of surgery if instructed.    __x__ 5. Notify your doctor if there is any change in your medical condition     (cold, fever, infections).    x___6. On the morning of surgery brush your teeth with toothpaste and water.  You may rinse your mouth with mouth wash if you wish.  Do not swallow any toothpaste or mouthwash.   Do not wear jewelry, make-up, hairpins, clips or nail polish.  Do not wear lotions, powders, or perfumes. You may wear deodorant.  Do not shave 48 hours  prior to surgery. Men may shave face and neck.  Do not bring valuables to the hospital.    [facility] is not responsible for any belongings or valuables.               Contacts, dentures or bridgework may not be worn into surgery.  Leave your suitcase in the car. After surgery it may be brought to your room.  For patients admitted to the hospital, discharge time is determined by your  treatment team.  _  Patients discharged the day of surgery will not be allowed to drive home.  You will need someone to drive you home and stay with you the night of your procedure.    Please read over the following fact sheets that you were given:   [facility] Preparing for Surgery    _x___ TAKE THE FOLLOWING MEDICATION THE MORNING OF SURGERY WITH A SMALL SIP OF WATER. These include:  1. COREG (CARVEDILOL)  2. PROTONIX (PANTOPRAZOLE)  3.  4.  5.  6.  ____Fleets enema or Magnesium Citrate as directed.   _x___ Use CHG Soap or sage wipes as directed on instruction sheet   ____ Use inhalers on the day of surgery and bring to hospital day of surgery  ____ Stop Metformin and Janumet 2 days prior to surgery.    ____ Take 1/2 of usual insulin dose the night before surgery and none  on the morning surgery.   ____ Follow recommendations from Cardiologist, Pulmonologist or PCP regarding stopping Aspirin, Coumadin, Plavix ,Eliquis, Effient, or Pradaxa, and Pletal.  X____Stop Anti-inflammatories such as Advil, Aleve, Ibuprofen, Motrin, Naproxen, Naprosyn, Goodies powders or aspirin products NOW-OK to take Tylenol    ____ Stop supplements until after surgery.   ____ Bring C-Pap to the hospital.   [place]

## 2019-03-28 LAB — SARS CORONAVIRUS 2 (TAT 6-24 HRS): SARS Coronavirus 2: NEGATIVE

## 2019-03-28 NOTE — Pre-Procedure Instructions (Signed)
Dr Kayleen Memos wanting medical clearance for abnormal ekg- called laura at dr dews and let her know. Also called over to Dr Dorathy Kinsman office and left message for nurse. Faxed clearance request and EKG over to Dr Dorathy Kinsman and [place] office

## 2019-03-29 LAB — TYPE AND SCREEN
ABO/RH(D): O POS
Antibody Screen: POSITIVE
PT AG Type: POSITIVE
Unit division: 0

## 2019-03-29 LAB — BPAM RBC
Blood Product Expiration Date: 202008132359
Unit Type and Rh: 5100

## 2019-03-29 NOTE — Pre-Procedure Instructions (Signed)
Linda Bush, MD  Physician  Cardiology  Progress Notes    Signed  Encounter Date:  03/01/2019          Signed      Expand All Collapse All    Show:Clear all [x] Manual[x] Template[] Copied  Added by: [x] End, Christopher, MD  [] Hover for details   New Outpatient Visit Date: 03/01/2019  Referring Provider: 20/08/2018, MD [facility]  Chief Complaint: Preop evaluation  HPI:  Linda [name] is a 23 y.o. female who is being seen today for the evaluation of chest pain and preop clearance at the request of Dr. 37. She has a history of lupus and mixed connective tissue disease complicated by end-stage renal disease, hypertension, and angioedema.  She saw Dr. Lucky Cowboy in mid May and is planning to undergo AV fistula creation.  Today, Linda [name] reports feeling relatively well other than intermittent pain in the right upper chest near her PermCath site.  This was initially worse after PermCath placement and seem to be exacerbated by movement of the right arm.  However, this has gradually improved.  She has otherwise not had any chest pain nor shortness of breath, palpitations, lightheadedness, orthopnea, or edema.  She is tolerating hemodialysis sessions well.  She has been under quite a bit of stress following the death of her mother a month ago.  Linda [name] had a complicated medical course last year leading to several hospitalizations.  Most of her care occurred at [facility], where she was admitted with altered mental status and acute kidney injury.  She ultimately required initiation of hemodialysis.  Several echocardiograms were performed, most recently in August, demonstrating normal LVEF with mild LVH, restrictive physiology, and moderate pulmonary hypertension.  It does not appear that she ever underwent ischemia evaluation.  She denies a history of prior heart disease but notes that her mother, who also had lupus, required stent placement in her mid-late 39s.   --------------------------------------------------------------------------------------------------  Cardiovascular History & Procedures: Cardiovascular Problems:  HFpEF with restrictive physiology on echo  Pericardial effusion  Risk Factors:  Hypertension, lupus, family history, and tobacco use  Cath/PCI:  None  CV Surgery:  None  EP Procedures and Devices:  None  Non-Invasive Evaluation(s):  TTE (04/13/2018, [facility]): Normal LV size with mild LVH.  LVEF 55-60% with restrictive filling pattern.  Normal RV size and function.  Severe left atrial enlargement.  Mild mitral and mild to moderate tricuspid regurgitation.  Moderate pulmonary hypertension.  Mild to moderate pulmonic regurgitation.  Trivial pericardial effusion.  TTE (11/30/2017): Normal LV size with moderate LVH.  LVEF 50-55% with normal diastolic function.  Trivial aortic regurgitation.  Mild mitral regurgitation.  Moderate left and mild right atrial enlargement.  Normal RV size and function.  Small posterior pericardial effusion without tamponade physiology.  Recent CV Pertinent Labs: Labs (Brief)       Lab Results  Component Value Date   INR 1.01 01/25/2018   BNP 1,781.4 (H) 11/29/2017   K 3.0 (L) 08/22/2018   MG 2.0 03/02/2018   BUN 7 08/22/2018   BUN 12 06/08/2017   CREATININE 3.51 (H) 08/22/2018      --------------------------------------------------------------------------------------------------      Past Medical History:  Diagnosis Date  . Acute kidney injury ([place])   . Angioedema   . Hypertension   . Lupus ([place])   . Migraines   . Mixed connective tissue disease [facility])          Past Surgical History:  Procedure Laterality Date  .  I&D EXTREMITY Right 08/06/2017   Procedure: IRRIGATION AND DEBRIDEMENT KNEE;  Surgeon: Shona Needles, MD;  Location: [facility];  Service: Orthopedics;  Laterality: Right;  . MULTIPLE TOOTH EXTRACTIONS      Active Medications       Current Meds  Medication Sig  . B Complex-C (B-COMPLEX WITH VITAMIN C) tablet Take 1 tablet by mouth daily.  . carvedilol (COREG) 6.25 MG tablet Take 1 tablet (6.25 mg total) by mouth 2 (two) times daily with a meal.  . oxyCODONE-acetaminophen (PERCOCET) 5-325 MG tablet Take 1 tablet by mouth every 4 (four) hours as needed for severe pain.  . pantoprazole (PROTONIX) 40 MG tablet Take 1 tablet (40 mg total) by mouth 2 (two) times daily.  . promethazine (PHENERGAN) 25 MG tablet Take 1 tablet (25 mg total) by mouth every 6 (six) hours as needed for nausea.  . sevelamer (RENAGEL) 800 MG tablet Take 800 mg by mouth 3 (three) times daily.  Marland Kitchen spironolactone (ALDACTONE) 25 MG tablet Take 25 mg by mouth daily.  . valACYclovir (VALTREX) 1000 MG tablet Take 1 tablet twice a day for 3 days with each outbreak.  . zolpidem (AMBIEN) 5 MG tablet Take 5 mg by mouth at bedtime as needed for sleep.       Allergies: Lisinopril and Vicodin [hydrocodone-acetaminophen]  Social History        Tobacco Use  . Smoking status: Current Every Day Smoker    Packs/day: 0.00    Years: 5.00    Pack years: 0.00    Types: Cigarettes, Cigars    Start date: 02/11/2015  . Smokeless tobacco: Never Used  Substance Use Topics  . Alcohol use: No    Alcohol/week: 0.0 standard drinks    Comment: social  . Drug use: No         Family History  Problem Relation Age of Onset  . Lupus Mother   . Hypertension Mother   . Kidney disease Mother   . Heart Problems Mother   . Diabetes Maternal Grandmother     Review of Systems: A 12-system review of systems was performed and was negative except as noted in the HPI.  --------------------------------------------------------------------------------------------------  Physical Exam: BP (!) 158/118 (BP Location: Left Arm, Patient Position: Sitting, Cuff Size: Normal)   Pulse 75   Ht 5\' 6"  (1.676 m)   Wt 164 lb (74.4 kg)   BMI 26.47 kg/m    General:  NAD HEENT: No conjunctival pallor or scleral icterus. Facemask in place Neck: Supple without lymphadenopathy, thyromegaly, JVD, or HJR. No carotid bruit. Lungs: Normal work of breathing. Clear to auscultation bilaterally without wheezes or crackles. Heart: Regular rate and rhythm without murmurs, rubs, or gallops. Non-displaced PMI. Abd: Bowel sounds present. Soft, NT/ND without hepatosplenomegaly Ext: No lower extremity edema. Radial, PT, and DP pulses are 2+ bilaterally Skin: Warm and dry. Neuro: Grossly intact without focal deficit. Psych: Initial flat affect but more engaged as visit progresses  EKG:  Normal sinus rhythm with LVH and inferolateral T-wave inversions.  Recent Labs       Lab Results  Component Value Date   WBC 15.1 (H) 08/22/2018   HGB 9.0 (L) 08/22/2018   HCT 29.4 (L) 08/22/2018   MCV 98.0 08/22/2018   PLT 252 08/22/2018      Recent Labs       Lab Results  Component Value Date   NA 136 08/22/2018   K 3.0 (L) 08/22/2018   CL 100 08/22/2018   CO2  26 08/22/2018   BUN 7 08/22/2018   CREATININE 3.51 (H) 08/22/2018   GLUCOSE 96 08/22/2018   ALT 6 08/22/2018      Recent Labs  No results found for: CHOL, HDL, LDLCALC, LDLDIRECT, TRIG, CHOLHDL     --------------------------------------------------------------------------------------------------  ASSESSMENT AND PLAN: Chest pain, abnormal EKG, and preop risk assessment: Pain is localized around tunneled dialysis catheter and is not consistent with angina.  However, EKG is notable for inferolateral T-wave inversions that are not new but have become more pronounced on serial EKG's since 2018.  These findings are most consistent with LVH and abnormal repolarization.  The patient's young age suggests a low risk of obstructive coronary artery disease.  However, given elevated risk for premature CAD in the setting of chronic inflammatory conditions such as lupus, as well as her  mother's history of PCI in he mid-late 30's, I think it would be prudent to perform a pharmacologic myocardial perfusion stress test to exclude high-risk ischemia.  If this is unrevealing, I think it would be reasonable to proceed with AV fistula creation at the patient's convenience.  HFpEF: Linda [name] appears euvolemic and does not express symptoms of decompensated heart failure.  She should continue her current medications and manage her volume through hemodialysis.  Hypertension: BP quite elevated today but typically normal.  I suspect that stress surrounding today's visit has contributed to this.  I will defer medication changes today.  Follow-up: Return to clinic in 3 months.  Linda Bush, MD 03/02/2019 10:01 AM         Electronically signed by Linda Bush, MD at 03/02/2019 10:09 AM   Office Visit on 03/01/2019     Detailed Report     Note shared with patient

## 2019-03-29 NOTE — Pre-Procedure Instructions (Signed)
Saw in epic that dr dews office had requested cardiac clearance July 1st and pt had been cleared by dr end and his office never notified us of clearance. Therefore Dr Lucky Cowboy ordered another EKG at pat appt that was abnormal. I sent this to Dr End who office said was on vacation this week. Another cardiologist reviewed ekg and said that ok to proceed unless pt is having active cp.

## 2019-03-30 ENCOUNTER — Ambulatory Visit: Payer: Medicare Other | Admitting: Anesthesiology

## 2019-03-30 ENCOUNTER — Encounter
Admission: RE | Disposition: A | Discharge status: Home / Self Care | Payer: Self-pay | Source: Home / Self Care | Attending: Vascular Surgery

## 2019-03-30 ENCOUNTER — Ambulatory Visit
Admission: RE | Admit: 2019-03-30 | Discharge: 2019-03-30 | Disposition: A | Discharge status: Home / Self Care | Payer: Medicare Other | Attending: Vascular Surgery | Admitting: Vascular Surgery

## 2019-03-30 ENCOUNTER — Telehealth: Payer: Self-pay | Admitting: Family Medicine

## 2019-03-30 ENCOUNTER — Encounter: Payer: Self-pay | Admitting: Anesthesiology

## 2019-03-30 DIAGNOSIS — N186 End stage renal disease: Secondary | ICD-10-CM | POA: Insufficient documentation

## 2019-03-30 DIAGNOSIS — L930 Discoid lupus erythematosus: Secondary | ICD-10-CM

## 2019-03-30 DIAGNOSIS — Z841 Family history of disorders of kidney and ureter: Secondary | ICD-10-CM | POA: Insufficient documentation

## 2019-03-30 DIAGNOSIS — Z992 Dependence on renal dialysis: Secondary | ICD-10-CM | POA: Insufficient documentation

## 2019-03-30 DIAGNOSIS — I120 Hypertensive chronic kidney disease with stage 5 chronic kidney disease or end stage renal disease: Secondary | ICD-10-CM | POA: Diagnosis not present

## 2019-03-30 DIAGNOSIS — F17290 Nicotine dependence, other tobacco product, uncomplicated: Secondary | ICD-10-CM | POA: Insufficient documentation

## 2019-03-30 DIAGNOSIS — K219 Gastro-esophageal reflux disease without esophagitis: Secondary | ICD-10-CM | POA: Insufficient documentation

## 2019-03-30 DIAGNOSIS — Z79899 Other long term (current) drug therapy: Secondary | ICD-10-CM | POA: Insufficient documentation

## 2019-03-30 DIAGNOSIS — Z20828 Contact with and (suspected) exposure to other viral communicable diseases: Secondary | ICD-10-CM | POA: Insufficient documentation

## 2019-03-30 DIAGNOSIS — F17210 Nicotine dependence, cigarettes, uncomplicated: Secondary | ICD-10-CM | POA: Insufficient documentation

## 2019-03-30 HISTORY — PX: GRAFT APPLICATION: SHX6696

## 2019-03-30 LAB — POCT I-STAT 4, (NA,K, GLUC, HGB,HCT)
Glucose, Bld: 82 mg/dL (ref 70–99)
HCT: 45 % (ref 36.0–46.0)
Hemoglobin: 15.3 g/dL — ABNORMAL HIGH (ref 12.0–15.0)
Potassium: 3 mmol/L — ABNORMAL LOW (ref 3.5–5.1)
Sodium: 137 mmol/L (ref 135–145)

## 2019-03-30 LAB — ABO/RH: ABO/RH(D): O POS

## 2019-03-30 LAB — POCT PREGNANCY, URINE: Preg Test, Ur: NEGATIVE

## 2019-03-30 SURGERY — GRAFT APPLICATION
Anesthesia: General | Site: Arm Upper | Laterality: Left

## 2019-03-30 MED ORDER — LIDOCAINE HCL (CARDIAC) PF 100 MG/5ML IV SOSY
PREFILLED_SYRINGE | INTRAVENOUS | Status: DC | PRN
  Administered 2019-03-30: 100 mg via INTRAVENOUS

## 2019-03-30 MED ORDER — FENTANYL CITRATE (PF) 100 MCG/2ML IJ SOLN
INTRAMUSCULAR | Status: AC
  Administered 2019-03-30: 25 ug via INTRAVENOUS
  Filled 2019-03-30: qty 2

## 2019-03-30 MED ORDER — MIDAZOLAM HCL 2 MG/2ML IJ SOLN
INTRAMUSCULAR | Status: DC | PRN
  Administered 2019-03-30: 2 mg via INTRAVENOUS

## 2019-03-30 MED ORDER — FENTANYL CITRATE (PF) 100 MCG/2ML IJ SOLN
50.0000 ug | Freq: Once | INTRAMUSCULAR | Status: AC
  Administered 2019-03-30: 50 ug via INTRAVENOUS

## 2019-03-30 MED ORDER — OXYCODONE-ACETAMINOPHEN 5-325 MG PO TABS
1.0000 | ORAL_TABLET | Freq: Once | ORAL | Status: AC
  Administered 2019-03-30: 1 via ORAL

## 2019-03-30 MED ORDER — CEFAZOLIN SODIUM-DEXTROSE 1-4 GM/50ML-% IV SOLN
INTRAVENOUS | Status: AC
  Filled 2019-03-30: qty 50

## 2019-03-30 MED ORDER — PROPOFOL 10 MG/ML IV BOLUS
INTRAVENOUS | Status: DC | PRN
  Administered 2019-03-30: 130 mg via INTRAVENOUS

## 2019-03-30 MED ORDER — CEFAZOLIN SODIUM-DEXTROSE 1-4 GM/50ML-% IV SOLN
1.0000 g | INTRAVENOUS | Status: AC
  Administered 2019-03-30: 2 g via INTRAVENOUS

## 2019-03-30 MED ORDER — FENTANYL CITRATE (PF) 100 MCG/2ML IJ SOLN
25.0000 ug | INTRAMUSCULAR | Status: AC | PRN
  Administered 2019-03-30 (×6): 25 ug via INTRAVENOUS

## 2019-03-30 MED ORDER — SODIUM CHLORIDE 0.9 % IV SOLN: INTRAVENOUS | Status: DC | PRN

## 2019-03-30 MED ORDER — PHENYLEPHRINE HCL (PRESSORS) 10 MG/ML IV SOLN
INTRAVENOUS | Status: DC | PRN
  Administered 2019-03-30: 100 ug via INTRAVENOUS

## 2019-03-30 MED ORDER — BUPIVACAINE-EPINEPHRINE (PF) 0.5% -1:200000 IJ SOLN
INTRAMUSCULAR | Status: DC | PRN
  Administered 2019-03-30: 10 mL via PERINEURAL

## 2019-03-30 MED ORDER — FENTANYL CITRATE (PF) 100 MCG/2ML IJ SOLN
INTRAMUSCULAR | Status: DC | PRN
  Administered 2019-03-30: 25 ug via INTRAVENOUS
  Administered 2019-03-30: 50 ug via INTRAVENOUS
  Administered 2019-03-30: 25 ug via INTRAVENOUS
  Administered 2019-03-30: 50 ug via INTRAVENOUS

## 2019-03-30 MED ORDER — HYDROMORPHONE HCL 1 MG/ML IJ SOLN
INTRAMUSCULAR | Status: AC
  Administered 2019-03-30: 0.5 mg via INTRAVENOUS
  Filled 2019-03-30: qty 1

## 2019-03-30 MED ORDER — BUPIVACAINE-EPINEPHRINE (PF) 0.5% -1:200000 IJ SOLN
INTRAMUSCULAR | Status: AC
  Filled 2019-03-30: qty 30

## 2019-03-30 MED ORDER — EVICEL 5 ML EX KIT
PACK | CUTANEOUS | Status: DC | PRN
  Administered 2019-03-30: 5 mL

## 2019-03-30 MED ORDER — PAPAVERINE HCL 30 MG/ML IJ SOLN
INTRAMUSCULAR | Status: AC
  Filled 2019-03-30: qty 2

## 2019-03-30 MED ORDER — EVICEL 5 ML EX KIT
PACK | CUTANEOUS | Status: AC
  Filled 2019-03-30: qty 1

## 2019-03-30 MED ORDER — SUCCINYLCHOLINE CHLORIDE 20 MG/ML IJ SOLN
INTRAMUSCULAR | Status: DC | PRN
  Administered 2019-03-30: 100 mg via INTRAVENOUS

## 2019-03-30 MED ORDER — ONDANSETRON HCL 4 MG/2ML IJ SOLN
INTRAMUSCULAR | Status: DC | PRN
  Administered 2019-03-30: 4 mg via INTRAVENOUS

## 2019-03-30 MED ORDER — HEPARIN SODIUM (PORCINE) 5000 UNIT/ML IJ SOLN
INTRAMUSCULAR | Status: AC
  Filled 2019-03-30: qty 1

## 2019-03-30 MED ORDER — CHLORHEXIDINE GLUCONATE CLOTH 2 % EX PADS: 6.0000 | MEDICATED_PAD | Freq: Once | CUTANEOUS | Status: DC

## 2019-03-30 MED ORDER — HYDROMORPHONE HCL 1 MG/ML IJ SOLN
0.5000 mg | INTRAMUSCULAR | Status: AC | PRN
  Administered 2019-03-30 (×4): 0.5 mg via INTRAVENOUS

## 2019-03-30 MED ORDER — HEPARIN SODIUM (PORCINE) 1000 UNIT/ML IJ SOLN
INTRAMUSCULAR | Status: DC | PRN
  Administered 2019-03-30: 3000 [IU] via INTRAVENOUS

## 2019-03-30 MED ORDER — SODIUM CHLORIDE 0.9 % IV SOLN
INTRAVENOUS | Status: DC
  Administered 2019-03-30 (×2): via INTRAVENOUS

## 2019-03-30 MED ORDER — OXYCODONE-ACETAMINOPHEN 5-325 MG PO TABS: 1.0000 | ORAL_TABLET | ORAL | 0 refills | Status: DC | PRN

## 2019-03-30 MED ORDER — ROCURONIUM BROMIDE 100 MG/10ML IV SOLN
INTRAVENOUS | Status: DC | PRN
  Administered 2019-03-30: 10 mg via INTRAVENOUS
  Administered 2019-03-30: 20 mg via INTRAVENOUS
  Administered 2019-03-30: 10 mg via INTRAVENOUS

## 2019-03-30 MED ORDER — DEXAMETHASONE SODIUM PHOSPHATE 10 MG/ML IJ SOLN
INTRAMUSCULAR | Status: DC | PRN
  Administered 2019-03-30: 10 mg via INTRAVENOUS

## 2019-03-30 MED ORDER — OXYCODONE-ACETAMINOPHEN 5-325 MG PO TABS
ORAL_TABLET | ORAL | Status: AC
  Administered 2019-03-30: 1 via ORAL
  Filled 2019-03-30: qty 1

## 2019-03-30 MED ORDER — ONDANSETRON HCL 4 MG/2ML IJ SOLN: 4.0000 mg | Freq: Once | INTRAMUSCULAR | Status: DC | PRN

## 2019-03-30 SURGICAL SUPPLY — 58 items
BAG DECANTER FOR FLEXI CONT (MISCELLANEOUS) ×3 IMPLANT
BLADE SURG SZ11 CARB STEEL (BLADE) ×3 IMPLANT
BOOT SUTURE AID YELLOW STND (SUTURE) ×3 IMPLANT
BRUSH SCRUB EZ  4% CHG (MISCELLANEOUS) ×1
BRUSH SCRUB EZ 4% CHG (MISCELLANEOUS) ×2 IMPLANT
CANISTER SUCT 1200ML W/VALVE (MISCELLANEOUS) ×3 IMPLANT
CHLORAPREP W/TINT 26 (MISCELLANEOUS) ×3 IMPLANT
CLIP SPRNG 6 S-JAW DBL (CLIP) ×2 IMPLANT
CLIP SPRNG 6MM S-JAW DBL (CLIP) ×3
COVER WAND RF STERILE (DRAPES) ×3 IMPLANT
DERMABOND ADVANCED (GAUZE/BANDAGES/DRESSINGS) ×2
DERMABOND ADVANCED .7 DNX12 (GAUZE/BANDAGES/DRESSINGS) ×2 IMPLANT
ELECT CAUTERY BLADE 6.4 (BLADE) ×3 IMPLANT
ELECT REM PT RETURN 9FT ADLT (ELECTROSURGICAL) ×3
ELECTRODE REM PT RTRN 9FT ADLT (ELECTROSURGICAL) ×2 IMPLANT
GEL ULTRASOUND 20GR AQUASONIC (MISCELLANEOUS) IMPLANT
GLOVE BIO SURGEON STRL SZ 6.5 (GLOVE) ×1 IMPLANT
GLOVE BIO SURGEON STRL SZ7 (GLOVE) ×6 IMPLANT
GLOVE INDICATOR 7.5 STRL GRN (GLOVE) ×3 IMPLANT
GOWN STRL REUS W/ TWL LRG LVL3 (GOWN DISPOSABLE) ×4 IMPLANT
GOWN STRL REUS W/ TWL XL LVL3 (GOWN DISPOSABLE) ×2 IMPLANT
GOWN STRL REUS W/TWL LRG LVL3 (GOWN DISPOSABLE) ×2
GOWN STRL REUS W/TWL XL LVL3 (GOWN DISPOSABLE) ×1
GRAFT PROPATEN STD WALL 6X40 (Vascular Products) ×1 IMPLANT
HEMOSTAT SURGICEL 2X14 (HEMOSTASIS) ×1 IMPLANT
HEMOSTAT SURGICEL 2X3 (HEMOSTASIS) ×3 IMPLANT
IV NS 500ML (IV SOLUTION) ×1
IV NS 500ML BAXH (IV SOLUTION) ×2 IMPLANT
KIT TURNOVER KIT A (KITS) ×3 IMPLANT
LABEL OR SOLS (LABEL) ×3 IMPLANT
LOOP RED MAXI  1X406MM (MISCELLANEOUS) ×1
LOOP VESSEL MAXI 1X406 RED (MISCELLANEOUS) ×2 IMPLANT
LOOP VESSEL MINI 0.8X406 BLUE (MISCELLANEOUS) ×2 IMPLANT
LOOPS BLUE MINI 0.8X406MM (MISCELLANEOUS) ×1
NDL FILTER BLUNT 18X1 1/2 (NEEDLE) ×2 IMPLANT
NEEDLE FILTER BLUNT 18X 1/2SAF (NEEDLE) ×1
NEEDLE FILTER BLUNT 18X1 1/2 (NEEDLE) ×2 IMPLANT
NS IRRIG 500ML POUR BTL (IV SOLUTION) ×3 IMPLANT
PACK EXTREMITY ARMC (MISCELLANEOUS) ×3 IMPLANT
PAD PREP 24X41 OB/GYN DISP (PERSONAL CARE ITEMS) ×3 IMPLANT
SOLUTION CELL SAVER (CLIP) ×2 IMPLANT
SPONGE LAP 18X18 RF (DISPOSABLE) ×1 IMPLANT
STOCKINETTE 48X4 2 PLY STRL (GAUZE/BANDAGES/DRESSINGS) ×2 IMPLANT
STOCKINETTE STRL 4IN 9604848 (GAUZE/BANDAGES/DRESSINGS) ×3 IMPLANT
SUT GORETEX CV-6TTC-13 36IN (SUTURE) ×2 IMPLANT
SUT MNCRL AB 4-0 PS2 18 (SUTURE) ×4 IMPLANT
SUT PROLENE 6 0 BV (SUTURE) ×12 IMPLANT
SUT SILK 2 0 (SUTURE) ×1
SUT SILK 2-0 18XBRD TIE 12 (SUTURE) ×2 IMPLANT
SUT SILK 3 0 (SUTURE) ×1
SUT SILK 3-0 18XBRD TIE 12 (SUTURE) ×2 IMPLANT
SUT SILK 4 0 (SUTURE) ×1
SUT SILK 4-0 18XBRD TIE 12 (SUTURE) ×2 IMPLANT
SUT VIC AB 3-0 SH 27 (SUTURE) ×2
SUT VIC AB 3-0 SH 27X BRD (SUTURE) ×2 IMPLANT
SYR 20ML LL LF (SYRINGE) ×3 IMPLANT
SYR 3ML LL SCALE MARK (SYRINGE) ×3 IMPLANT
SYR TB 1ML 27GX1/2 LL (SYRINGE) IMPLANT

## 2019-03-30 NOTE — Anesthesia Preprocedure Evaluation (Addendum)
Anesthesia Evaluation  Patient identified by MRN, date of birth, ID band Patient awake    Reviewed: Allergy & Precautions, NPO status , Patient's Chart, lab work & pertinent test results, reviewed documented beta blocker date and time   Airway Mallampati: II  TM Distance: >3 FB     Dental  (+) Chipped   Pulmonary Current Smoker,           Cardiovascular hypertension, Pt. on medications and Pt. on home beta blockers      Neuro/Psych  Headaches,    GI/Hepatic GERD  Controlled,  Endo/Other    Renal/GU ESRFRenal disease     Musculoskeletal  (+) Arthritis ,   Abdominal   Peds  Hematology   Anesthesia Other Findings Lupus. Smokes. ECHO 2 1 yr ago.  Reproductive/Obstetrics                             Anesthesia Physical Anesthesia Plan  ASA: III  Anesthesia Plan: General   Post-op Pain Management:    Induction: Intravenous  PONV Risk Score and Plan:   Airway Management Planned: Oral ETT  Additional Equipment:   Intra-op Plan:   Post-operative Plan:   Informed Consent: I have reviewed the patients History and Physical, chart, labs and discussed the procedure including the risks, benefits and alternatives for the proposed anesthesia with the patient or authorized representative who has indicated his/her understanding and acceptance.       Plan Discussed with: CRNA  Anesthesia Plan Comments:        Anesthesia Quick Evaluation

## 2019-03-30 NOTE — Transfer of Care (Signed)
Immediate Anesthesia Transfer of Care Note  Patient: Linda [name]  Procedure(s) Performed: BRACHIAL CEPHALIC GRAFT CREATION (Left Arm Upper)  Patient Location: PACU  Anesthesia Type:General  Level of Consciousness: awake and oriented  Airway & Oxygen Therapy: Patient Spontanous Breathing and Patient connected to face mask oxygen  Post-op Assessment: Report given to RN and Post -op Vital signs reviewed and stable  Post vital signs: Reviewed and stable  Last Vitals:  Vitals Value Taken Time  BP    Temp    Pulse    Resp    SpO2      Last Pain:  Vitals:   03/30/19 1210  TempSrc: Tympanic  PainSc: 0-No pain         Complications: No apparent anesthesia complications

## 2019-03-30 NOTE — Discharge Instructions (Signed)
Take 2 tablets of the oxycodone-acetaminophen every 6 hours as needed for pain. You can also use ibuprofen intermittently.   AMBULATORY SURGERY  DISCHARGE INSTRUCTIONS   1) The drugs that you were given will stay in your system until tomorrow so for the next 24 hours you should not:  A) Drive an automobile B) Make any legal decisions C) Drink any alcoholic beverage   2) You may resume regular meals tomorrow.  Today it is better to start with liquids and gradually work up to solid foods.  You may eat anything you prefer, but it is better to start with liquids, then soup and crackers, and gradually work up to solid foods.   3) Please notify your doctor immediately if you have any unusual bleeding, trouble breathing, redness and pain at the surgery site, drainage, fever, or pain not relieved by medication.    4) Additional Instructions:        Please contact your physician with any problems or Same Day Surgery at [phone], Monday through Friday 6 am to 4 pm, or [place] at [facility] number at [phone].

## 2019-03-30 NOTE — Telephone Encounter (Signed)
Requested fax to obtain medical clearance prior to her AV fistula surgery.  Called patient to discuss medical clearance that she has not been seen here in clinic and will need a physical exam to provide medical clearance.  Patient informed me that she has already obtained clearance from another doctor and does not need it from her PCP, states fax was sent in error.  Advised that if she needs anything further to please call and schedule an appointment and I am happy to do so.  Dalphine Handing, PGY-3 [facility] 03/30/2019 11:20 AM

## 2019-03-30 NOTE — Anesthesia Post-op Follow-up Note (Signed)
Anesthesia QCDR form completed.        

## 2019-03-30 NOTE — Op Note (Signed)
[facility]  OPERATIVE NOTE   PROCEDURE:  Left upper arm brachial artery to axillary vein arteriovenous graft  PRE-OPERATIVE DIAGNOSIS: 1. end stage renal disease  2. Lupus  POST-OPERATIVE DIAGNOSIS: same  SURGEON: Leotis Pain  ASSISTANT(S): Hezzie Bump, PA-C  ANESTHESIA: general  ESTIMATED BLOOD LOSS: 25 cc  FINDING(S): 1. Inadequate cephalic vein for fistula creation.  SPECIMEN(S):  None  INDICATIONS:   Linda [name] is a 23 y.o. female who presents with end stage renal disease and need for dialysis access.  Risk, benefits, and alternatives to access surgery were discussed.  The patient is aware the risks include but are not limited to: bleeding, infection, steal syndrome, nerve damage, ischemic monomelic neuropathy, failure to mature, and need for additional procedures.  The patient is aware of the risks and elects to proceed forward. An assistant was present during the procedure to help facilitate the exposure and expedite the procedure.  DESCRIPTION: After full informed written consent was obtained from the patient, the patient was brought back to the operating room and placed supine upon the operating table.  The patient was given IV antibiotics prior to proceeding.  After obtaining adequate sedation, the patient was prepped and draped in standard fashion for a left arm access procedure. The assistant provided retraction and mobilization to help facilitate exposure and expedite the procedure throughout the entire procedure.  This included following suture, using retractors, and optimizing lighting. I turned my attention first to the antecubitum.   I made an incision over the brachial artery, and dissected down through the subcutaneous tissue to the fascia carefully and was able to dissect out the brachial artery.  The artery was patent and of adequate size to support a graft.   It was controlled proximally and distally with vessel loops. Originally,  duplex had suggested her cephalic vein on the left side will be adequate for fistula creation.  This was several months ago.  We explored the cephalic vein and it was small and sclerotic.  I open the cephalic vein and tried to pass dilators but these did not pass freely and I did not feel this was likely to mature for adequate fistula creation.  Her brachial vein at the antecubital fossa was also small and not ideal for a loop forearm AV graft.  I felt her best option would be a brachial artery to axillary vein AV graft.  I turned my attention to the high bicipital groove in the axilla.  I made an incision and dissected down through the subcutaneous tissue and fascia until I reached the axillary vein.  It was patent and adequate size for graft creation.  I then dissected this vein proximally and distal and prepared it for control with Bulldog clamps.  I took a Dietitian and dissected from the antecubital up to the axillary incision.  Then I delivered the 6 mm propaten PTFE graft through this metal tunneler and then pulled out the metal tunneler leaving the graft in place and making sure the line was up for orientation.  I then gave the patient 3000 units of heparin to gain some anticoagulation.  After waiting 3 minutes, I placed the brachial artery under tension proximally and distally with vessel loops, made an arteriotomy and extended it with a Potts scissor.  I sewed the graft to this arteriotomy with a running stitch of CV-6 suture.  At this point, then I completed the anastomosis in the usual fashion.  I released the vessel loops  on the inflow and allowed the artery to decompress through the graft. There was good pulsatile bleeding through this graft.  I clamped the graft near its arterial anastomosis and sucked out all the blood in the graft and loaded the graft with heparinized saline.  At this point, I pulled the graft to appropriate length and reset my exposure of the axillary vein.  Then, I  controlled the vein with Bulldog clamps.  An anterior wall venotomy was created with an 11 blade and extended with Potts scissors.  I then spatulated the graft to facilitate an end-to- side anastomosis matching the arteriotomy.  In the process of spatulating, I cut the graft to appropriate length for this anastomosis.  This graft was sewn to the vein in an end-to-side configuration with a running CV-6 suture.  Prior to completing this anastomosis, I allowed the vein to back bleed and then I also allowed the artery to bleed in an antegrade fashion.  I completed this anastomosis in the usual fashion, and then irrigated out the high bicipital exposure and then placed Surgicel and Evicel.  I then turned my attention back to the antecubitum.  There was pulsatile flow in the artery beyond the graft.   At this point, I washed out the antecubital incision. Surgicel and Evicel were then placed. There was no more active bleeding.  The subcutaneous tissue was reapproximated with a running stitch of 3-0 Vicryl.  The skin was then reapproximated with a running subcuticular 4-0 Monocryl.  The skin was then cleaned, dried, and Dermabond used to reinforce the skin closure.  We then turned our attention to the axillary incision.  The subcutaneous tissue was repaired with running stitch of 3-0 Vicryl.  The skin was then reapproximated with running subcuticular 4-0 Monocryl.  The skin was then cleaned, dried, and then the skin closure was reinforced with Dermabond.  The patient was then awakened from anesthesia and taken to the recovery room in stable condition having tolerated the procedure well.    COMPLICATIONS: None  CONDITION: Stable   Leotis Pain 03/30/2019 3:40 PM   This note was created with Dragon Medical transcription system. Any errors in dictation are purely unintentional.

## 2019-03-30 NOTE — Anesthesia Postprocedure Evaluation (Signed)
Anesthesia Post Note  Patient: Linda [name]  Procedure(s) Performed: BRACHIAL CEPHALIC GRAFT CREATION (Left Arm Upper)  Patient location during evaluation: PACU Anesthesia Type: General Level of consciousness: awake and alert Pain management: pain level controlled Vital Signs Assessment: post-procedure vital signs reviewed and stable Respiratory status: spontaneous breathing, nonlabored ventilation, respiratory function stable and patient connected to nasal cannula oxygen Cardiovascular status: blood pressure returned to baseline and stable Postop Assessment: no apparent nausea or vomiting Anesthetic complications: no     Last Vitals:  Vitals:   03/30/19 1713 03/30/19 1804  BP: (!) 131/92 124/83  Pulse: 61 (!) 59  Resp: 16 18  Temp: 36.5 C   SpO2: 96% 99%    Last Pain:  Vitals:   03/30/19 1804  TempSrc:   PainSc: 4                  Martha Clan

## 2019-03-30 NOTE — Anesthesia Procedure Notes (Signed)
Procedure Name: Intubation Date/Time: 03/30/2019 1:59 PM Performed by: Nelda Marseille, CRNA Pre-anesthesia Checklist: Patient identified, Patient being monitored, Timeout performed, Emergency Drugs available and Suction available Patient Re-evaluated:Patient Re-evaluated prior to induction Oxygen Delivery Method: Circle system utilized Preoxygenation: Pre-oxygenation with 100% oxygen Induction Type: IV induction Ventilation: Mask ventilation without difficulty Laryngoscope Size: Mac, 3 and McGraph Grade View: Grade II Tube type: Oral Tube size: 7.0 mm Number of attempts: 1 Airway Equipment and Method: Stylet Placement Confirmation: ETT inserted through vocal cords under direct vision,  positive ETCO2 and breath sounds checked- equal and bilateral Secured at: 21 cm Tube secured with: Tape Dental Injury: Teeth and Oropharynx as per pre-operative assessment

## 2019-03-30 NOTE — H&P (Signed)
[facility] History & Physical Update  The patient was interviewed and re-examined.  The patient's previous History and Physical has been reviewed and is unchanged.  There is no change in the plan of care. We plan to proceed with the scheduled procedure.  Leotis Pain, MD  03/30/2019, 12:24 PM

## 2019-03-30 NOTE — H&P (Signed)
[facility] Admission History & Physical  MRN : [id number]  Linda [name] is a 23 y.o. (1996-02-11) female who presents with chief complaint of No chief complaint on file. Marland Kitchen  HPI Linda [name] is a 23 y.o. female.  I am asked to see the patient by Dr. Marval Regal for evaluation of dialysis access.  She started dialysis about 3 to 4 months ago via a right internal jugular catheter which is working well.  She reports no major issues with the catheter.  No fevers or chills.  She is right-hand dominant.  She is familiar with dialysis as her mother has been on dialysis for many years.  Her dialysis dependent renal failure is likely secondary to hypertension and lupus.  She has previously had a vein mapping performed in [place] which demonstrated triphasic arterial waveforms throughout both upper extremities without significant disease.  Her cephalic vein was small in the forearm but of good size at the antecubital fossa and proximal and the nondominant left upper extremity.  She has had no previous dialysis access placements or creations other than her catheter.         Past Medical History:  Diagnosis Date  . Acute kidney injury ([place])   . Angioedema   . Hypertension   . Lupus ([place])   . Migraines   . Mixed connective tissue disease [facility])          Past Surgical History:  Procedure Laterality Date  . I&D EXTREMITY Right 08/06/2017   Procedure: IRRIGATION AND DEBRIDEMENT KNEE;  Surgeon: Shona Needles, MD;  Location: [place];  Service: Orthopedics;  Laterality: Right;  . MULTIPLE TOOTH EXTRACTIONS      Family History      Family History  Problem Relation Age of Onset  . Lupus Mother   . Hypertension Mother   . Kidney disease Mother   . Diabetes Maternal Grandmother   No bleeding or clotting disorders  Social History Social History        Tobacco Use  . Smoking status: Current Every Day Smoker    Packs/day: 0.00     Types: Cigarettes, Cigars    Start date: 02/11/2015  . Smokeless tobacco: Never Used  Substance Use Topics  . Alcohol use: No    Alcohol/week: 0.0 standard drinks    Comment: social  . Drug use: No         Allergies  Allergen Reactions  . Lisinopril Anaphylaxis    Angioedema  . Vicodin [Hydrocodone-Acetaminophen] Itching and Rash          Current Outpatient Medications  Medication Sig Dispense Refill  . acetaminophen-codeine (TYLENOL #3) 300-30 MG tablet Take 1 tablet by mouth 2 times a day as needed for pain    . B Complex-C (B-COMPLEX WITH VITAMIN C) tablet Take 1 tablet by mouth daily.    . carvedilol (COREG) 6.25 MG tablet Take 1 tablet (6.25 mg total) by mouth 2 (two) times daily with a meal. 60 tablet 3  . diltiazem (CARDIZEM CD) 120 MG 24 hr capsule Take 120 mg by mouth daily.  2  . metroNIDAZOLE (FLAGYL) 500 MG tablet Take 1 tablet (500 mg total) by mouth 2 (two) times daily. 14 tablet 2  . multivitamin (RENA-VIT) TABS tablet     . sevelamer (RENAGEL) 800 MG tablet Take 800 mg by mouth 3 (three) times daily.    Marland Kitchen spironolactone (ALDACTONE) 25 MG tablet Take 25 mg by mouth daily.  1  .  zolpidem (AMBIEN) 5 MG tablet Take 5 mg by mouth at bedtime as needed for sleep.   3  . amLODipine (NORVASC) 10 MG tablet Take 1 tablet (10 mg total) by mouth daily. (Patient not taking: Reported on 08/22/2018) 90 tablet 3  . chlorthalidone (HYGROTON) 50 MG tablet Take 1 tablet (50 mg total) by mouth daily. (Patient not taking: Reported on 10/06/2018) 30 tablet 0  . cloNIDine (CATAPRES) 0.1 MG tablet Take 1 tablet (0.1 mg total) by mouth 3 (three) times daily. (Patient not taking: Reported on 10/06/2018) 270 tablet 3  . oxyCODONE-acetaminophen (PERCOCET) 5-325 MG tablet Take 1 tablet by mouth every 4 (four) hours as needed for severe pain. (Patient not taking: Reported on 10/06/2018) 10 tablet 0  . pantoprazole (PROTONIX) 40 MG tablet Take 1 tablet (40 mg total) by mouth 2 (two)  times daily. 60 tablet 0  . promethazine (PHENERGAN) 25 MG tablet Take 1 tablet (25 mg total) by mouth every 6 (six) hours as needed for nausea. (Patient not taking: Reported on 10/06/2018) 30 tablet 0  . valACYclovir (VALTREX) 1000 MG tablet Take 1 tablet twice a day for 3 days with each outbreak. (Patient not taking: Reported on 10/06/2018) 30 tablet 11   No current facility-administered medications for this visit.       REVIEW OF SYSTEMS (Negative unless checked)  Constitutional: [] ?Weight loss  [] ?Fever  [] ?Chills Cardiac: [] ?Chest pain   [] ?Chest pressure   [] ?Palpitations   [] ?Shortness of breath when laying flat   [] ?Shortness of breath at rest   [] ?Shortness of breath with exertion. Vascular:  [] ?Pain in legs with walking   [] ?Pain in legs at rest   [] ?Pain in legs when laying flat   [] ?Claudication   [] ?Pain in feet when walking  [] ?Pain in feet at rest  [] ?Pain in feet when laying flat   [] ?History of DVT   [] ?Phlebitis   [] ?Swelling in legs   [] ?Varicose veins   [] ?Non-healing ulcers Pulmonary:   [] ?Uses home oxygen   [] ?Productive cough   [] ?Hemoptysis   [] ?Wheeze  [] ?COPD   [] ?Asthma Neurologic:  [] ?Dizziness  [] ?Blackouts   [] ?Seizures   [] ?History of stroke   [] ?History of TIA  [] ?Aphasia   [] ?Temporary blindness   [] ?Dysphagia   [] ?Weakness or numbness in arms   [] ?Weakness or numbness in legs Musculoskeletal:  [] ?Arthritis   [] ?Joint swelling   [] ?Joint pain   [] ?Low back pain Hematologic:  [] ?Easy bruising  [] ?Easy bleeding   [] ?Hypercoagulable state   [x] ?Anemic  [] ?Hepatitis Gastrointestinal:  [] ?Blood in stool   [] ?Vomiting blood  [] ?Gastroesophageal reflux/heartburn   [x] ?Abdominal pain Genitourinary:  [x] ?Chronic kidney disease   [] ?Difficult urination  [] ?Frequent urination  [] ?Burning with urination   [] ?Hematuria Skin:  [] ?Rashes   [] ?Ulcers   [] ?Wounds Psychological:  [] ?History of anxiety   [] ? History of major depression.     Physical Examination  Vitals:    03/30/19 1210  BP: (!) 140/103  Pulse: 70  Resp: 18  Temp: 98.3 F (36.8 C)  TempSrc: Tympanic  SpO2: 100%   There is no height or weight on file to calculate BMI. Gen: WD/WN, NAD Head: [place]/AT, No temporalis wasting. Ear/Nose/Throat: Hearing grossly intact, nares w/o erythema or drainage, oropharynx w/o Erythema/Exudate,  Eyes: Conjunctiva clear, sclera non-icteric Neck: Trachea midline.  No JVD.  Pulmonary:  Good air movement, respirations not labored, no use of accessory muscles.  Cardiac: RRR, normal S1, S2. Vascular:  Vessel Right Left  Radial Palpable Palpable  Gastrointestinal: soft, non-tender/non-distended. No guarding/reflex.  Musculoskeletal: M/S 5/5 throughout.  Extremities without ischemic changes.  No deformity or atrophy.  Neurologic: Sensation grossly intact in extremities.  Symmetrical.  Speech is fluent. Motor exam as listed above. Psychiatric: Judgment intact, Mood & affect appropriate for pt's clinical situation. Dermatologic: No rashes or ulcers noted.  No cellulitis or open wounds.      CBC Lab Results  Component Value Date   WBC 8.4 03/27/2019   HGB 15.3 (H) 03/30/2019   HCT 45.0 03/30/2019   MCV 96.5 03/27/2019   PLT 157 03/27/2019    BMET    Component Value Date/Time   NA 137 03/30/2019 1200   NA 139 06/08/2017 1145   K 3.0 (L) 03/30/2019 1200   CL 98 03/27/2019 1510   CO2 25 03/27/2019 1510   GLUCOSE 82 03/30/2019 1200   BUN 46 (H) 03/27/2019 1510   BUN 12 06/08/2017 1145   CREATININE 3.69 (H) 03/27/2019 1510   CALCIUM 9.5 03/27/2019 1510   GFRNONAA 16 (L) 03/27/2019 1510   GFRAA 19 (L) 03/27/2019 1510   CrCl cannot be calculated (Unknown ideal weight.).  COAG Lab Results  Component Value Date   INR 1.0 03/27/2019   INR 1.01 01/25/2018   INR 1.06 01/07/2018    Radiology Nm Myocar Multi W/spect W/wall Motion / Ef  Result Date: 03/07/2019  Low risk, probably normal pharmacologic  myocardial perfusion stress test.  There is no significant ischemia or scar.  Calculated left ventricular systolic function is mildly to moderately reduced but is likely affected by significant extracardiac activity (48% by Siemens calculation, 38% by QGS). LVEF appears normal by visual estimation.  There is no significant coronary artery calcification noted on the attenuation correction CT.  Central venous catheter is noted in the SVC.       Assessment/Plan Primary hypertension Likely an underlying cause of her renal failure and blood pressure control important in reducing the progression of atherosclerotic disease. On appropriate oral medications.   Lupus erythematosus Likely an underlying cause of her renal failure.  ESRD on dialysis [facility]) Recommend:  At this time the patient does not have appropriate extremity access for dialysis  Patient should have a left brachiocephalic AVF created.  The risks, benefits and alternative therapies were reviewed in detail with the patient.  All questions were answered.  The patient agrees to proceed with surgery.  The differences between fistulas, graft, and catheters were all discussed.    Leotis Pain, MD  03/30/2019 12:26 PM

## 2019-03-31 NOTE — Addendum Note (Signed)
Addendum  created 03/31/19 0857 by Nelda Marseille, CRNA   Intraprocedure Meds edited

## 2019-04-11 ENCOUNTER — Telehealth (INDEPENDENT_AMBULATORY_CARE_PROVIDER_SITE_OTHER): Payer: Self-pay

## 2019-04-11 NOTE — Telephone Encounter (Signed)
Home health nurse called to report patient has missed 2 nursing visits. The patient has had one nursing visit but keep canceling the other visits .The nurse called the patient today but the patient has not return a call back

## 2019-04-13 ENCOUNTER — Ambulatory Visit (INDEPENDENT_AMBULATORY_CARE_PROVIDER_SITE_OTHER): Payer: Medicare Other | Admitting: Nurse Practitioner

## 2019-05-05 ENCOUNTER — Ambulatory Visit (INDEPENDENT_AMBULATORY_CARE_PROVIDER_SITE_OTHER): Payer: Medicare Other | Admitting: Nurse Practitioner

## 2019-06-14 ENCOUNTER — Ambulatory Visit: Payer: Medicare Other | Admitting: Family Medicine

## 2019-06-26 ENCOUNTER — Emergency Department (HOSPITAL_COMMUNITY)
Admission: EM | Admit: 2019-06-26 | Discharge: 2019-06-26 | Disposition: A | Discharge status: Home / Self Care | Payer: Medicare Other | Attending: Emergency Medicine | Admitting: Emergency Medicine

## 2019-06-26 ENCOUNTER — Other Ambulatory Visit: Payer: Self-pay

## 2019-06-26 DIAGNOSIS — I509 Heart failure, unspecified: Secondary | ICD-10-CM | POA: Insufficient documentation

## 2019-06-26 DIAGNOSIS — I132 Hypertensive heart and chronic kidney disease with heart failure and with stage 5 chronic kidney disease, or end stage renal disease: Secondary | ICD-10-CM | POA: Diagnosis not present

## 2019-06-26 DIAGNOSIS — F17210 Nicotine dependence, cigarettes, uncomplicated: Secondary | ICD-10-CM | POA: Insufficient documentation

## 2019-06-26 DIAGNOSIS — L299 Pruritus, unspecified: Secondary | ICD-10-CM | POA: Diagnosis not present

## 2019-06-26 DIAGNOSIS — Z79899 Other long term (current) drug therapy: Secondary | ICD-10-CM | POA: Diagnosis not present

## 2019-06-26 DIAGNOSIS — Z992 Dependence on renal dialysis: Secondary | ICD-10-CM | POA: Diagnosis not present

## 2019-06-26 DIAGNOSIS — N186 End stage renal disease: Secondary | ICD-10-CM | POA: Insufficient documentation

## 2019-06-26 MED ORDER — HYDROXYZINE HCL 25 MG PO TABS: 25.0000 mg | ORAL_TABLET | Freq: Four times a day (QID) | ORAL | 0 refills | Status: DC

## 2019-06-26 MED ORDER — HYDROXYZINE HCL 25 MG PO TABS
25.0000 mg | ORAL_TABLET | Freq: Once | ORAL | Status: AC
  Administered 2019-06-26: 25 mg via ORAL
  Filled 2019-06-26: qty 1

## 2019-06-26 NOTE — ED Provider Notes (Signed)
[facility] EMERGENCY DEPARTMENT Provider Note   CSN: [id number] Arrival date & time: 06/26/19  0255     History   Chief Complaint Chief Complaint  Patient presents with  . Pruritis    HPI Linda [name] is a 23 y.o. female.     Patient with history of ESRD-HD, HTN, Lupus presents with generalized itching rash x 1-2 days. She is staying in a hotel and is concerned there is a bed bug infestation. No fever, redness, blistering or drainage.   The history is provided by the patient. No language interpreter was used.    Past Medical History:  Diagnosis Date  . Acute kidney injury ([place])   . Angioedema   . Dialysis patient ([place])    M,W, F-AS OF 03-27-19 PT STATES SHE HAS NOT BEEN TO DIALYSIS IN 2 WEEKS  . GERD (gastroesophageal reflux disease)   . Hypertension   . Lupus ([place])   . Migraines   . Mixed connective tissue disease ([place])   . Renal insufficiency 03/07/2019   ESRD    Patient Active Problem List   Diagnosis Date Noted  . Abnormal electrocardiogram 03/02/2019  . Chronic heart failure with preserved ejection fraction (HFpEF) ([place]) 03/02/2019  . Other chest pain 01/20/2019  . ESRD on dialysis ([place]) 01/10/2019  . Calciphylaxis 12/27/2018  . Hypokalemia 03/02/2018  . Tobacco abuse 03/02/2018  . Tongue ulceration 01/24/2018  . Hypertensive emergency   . Tongue swelling 01/07/2018  . Sepsis ([place]) 01/06/2018  . Angioedema 01/02/2018  . Tachycardia 01/02/2018  . Acute renal failure superimposed on stage 2 chronic kidney disease ([place])   . Hypertensive urgency 11/29/2017  . Enteritis 11/29/2017  . Nausea vomiting and diarrhea 11/29/2017  . Leukocytosis 11/29/2017  . Septic prepatellar bursitis of right knee 08/19/2017  . Grief reaction 08/19/2017  . Right knee pain   . Elbow pain, right   . Back pain 05/04/2017  . Bilateral knee swelling 04/20/2017  . History of connective tissue disease   . Leg swelling   . Abdominal pain   . Lupus  erythematosus   . Cellulitis 04/09/2017  . Hypertension 12/11/2016  . Mixed connective tissue disease ([place]) 02/11/2016  . High BMI 01/19/2014  . Implanon in place 01/19/2014    Past Surgical History:  Procedure Laterality Date  . GRAFT APPLICATION Left 0000000   Procedure: BRACHIAL CEPHALIC GRAFT CREATION;  Surgeon: Algernon Huxley, MD;  Location: [facility];  Service: Vascular;  Laterality: Left;  . I&D EXTREMITY Right 08/06/2017   Procedure: IRRIGATION AND DEBRIDEMENT KNEE;  Surgeon: Shona Needles, MD;  Location: [place];  Service: Orthopedics;  Laterality: Right;  . MULTIPLE TOOTH EXTRACTIONS       OB History    Gravida  0   Para  0   Term  0   Preterm  0   AB  0   Living  0     SAB  0   TAB  0   Ectopic  0   Multiple  0   Live Births               Home Medications    Prior to Admission medications   Medication Sig Start Date End Date Taking? Authorizing Provider  B Complex-C (B-COMPLEX WITH VITAMIN C) tablet Take 1 tablet by mouth daily.    [provider]  carvedilol (COREG) 6.25 MG tablet Take 1 tablet (6.25 mg total) by mouth 2 (two) times daily with a meal.  03/09/18   [place] Bing, DO  furosemide (LASIX) 80 MG tablet Take 80 mg by mouth daily.    [provider]  hydrOXYzine (ATARAX/VISTARIL) 25 MG tablet Take 1 tablet (25 mg total) by mouth every 6 (six) hours. 06/26/19   Charlann Lange, PA-C  oxyCODONE-acetaminophen (PERCOCET) 5-325 MG tablet Take 1 tablet by mouth every 4 (four) hours as needed for severe pain. 03/30/19   Algernon Huxley, MD  pantoprazole (PROTONIX) 40 MG tablet Take 1 tablet (40 mg total) by mouth 2 (two) times daily. 01/28/18 03/30/19  Alma Friendly, MD  promethazine (PHENERGAN) 25 MG tablet Take 1 tablet (25 mg total) by mouth every 6 (six) hours as needed for nausea. 07/14/18   Shelly Bombard, MD  sevelamer (RENAGEL) 800 MG tablet Take 800 mg by mouth 3 (three) times daily. 07/29/18   [provider]  spironolactone (ALDACTONE) 25 MG tablet Take 25 mg by mouth daily. 07/14/18   [provider]  valACYclovir (VALTREX) 1000 MG tablet Take 1 tablet twice a day for 3 days with each outbreak. 07/14/18   Shelly Bombard, MD  zolpidem (AMBIEN) 5 MG tablet Take 5 mg by mouth at bedtime as needed for sleep.  07/18/18   [provider]    Family History Family History  Problem Relation Age of Onset  . Lupus Mother   . Hypertension Mother   . Kidney disease Mother   . Heart Problems Mother   . Coronary artery disease Mother 68       stent  . Diabetes Maternal Grandmother     Social History Social History   Tobacco Use  . Smoking status: Current Every Day Smoker    Packs/day: 0.25    Years: 5.00    Pack years: 1.25    Types: Cigarettes, Cigars    Start date: 02/11/2015  . Smokeless tobacco: Never Used  Substance Use Topics  . Alcohol use: No    Alcohol/week: 0.0 standard drinks  . Drug use: No     Allergies   Lisinopril and Vicodin [hydrocodone-acetaminophen]   Review of Systems Review of Systems  Constitutional: Negative for chills and fever.  HENT: Negative.   Respiratory: Negative.   Cardiovascular: Negative.   Gastrointestinal: Negative.   Musculoskeletal: Negative.   Skin: Positive for rash.       See HPI.  Neurological: Negative.      Physical Exam Updated Vital Signs BP (!) 133/98 (BP Location: Right Arm)   Pulse 82   Temp 97.9 F (36.6 C) (Oral)   Resp 18   SpO2 100%   Physical Exam Constitutional:      Appearance: She is well-developed.  Neck:     Musculoskeletal: Normal range of motion.  Pulmonary:     Effort: Pulmonary effort is normal.  Skin:    General: Skin is warm and dry.     Comments: No visualized red or raised rash. Mild general excoriation. No evidence scabies, bed bugs, or other infestation.  Neurological:     Mental Status: She is alert and oriented to person, place, and time.      ED Treatments /  Results  Labs (all labs ordered are listed, but only abnormal results are displayed) Labs Reviewed - No data to display  EKG None  Radiology No results found.  Procedures Procedures (including critical care time)  Medications Ordered in ED Medications  hydrOXYzine (ATARAX/VISTARIL) tablet 25 mg (has no administration in time range)  Initial Impression / Assessment and Plan / ED Course  I have reviewed the triage vital signs and the nursing notes.  Pertinent labs & imaging results that were available during my care of the patient were reviewed by me and considered in my medical decision making (see chart for details).        Patient with generalized itching and reported rash. History of dialysis, lupus but no history of pruritis.   Will Rx Atarax for itching. Do not feel treatment for scabies is indicated. No hives. Do not feel the itching is from bed bugs. Will treat symptomatically.   Final Clinical Impressions(s) / ED Diagnoses   Final diagnoses:  Pruritus     ED Discharge Orders         Ordered    hydrOXYzine (ATARAX/VISTARIL) 25 MG tablet  Every 6 hours     06/26/19 0442           Charlann Lange, PA-C [id number]    Delora Fuel, MD [id number]

## 2019-06-26 NOTE — ED Triage Notes (Signed)
Pt staying in a hotel, woke up yesterday with hives and itching, believes she may have bed bugs

## 2019-06-26 NOTE — Discharge Instructions (Signed)
Use Atarax as directed for itching. Also recommend heavy moisturizing after each shower/bath.   Follow up with your doctor if itching continues.

## 2019-06-28 IMAGING — CR DG CHEST 2V
2 series · 2 of 2 positions shown · non-contrast
Comparison: 04/09/2017

CLINICAL DATA: Short of breath

EXAM:
CHEST - 2 VIEW

[w chest lat]
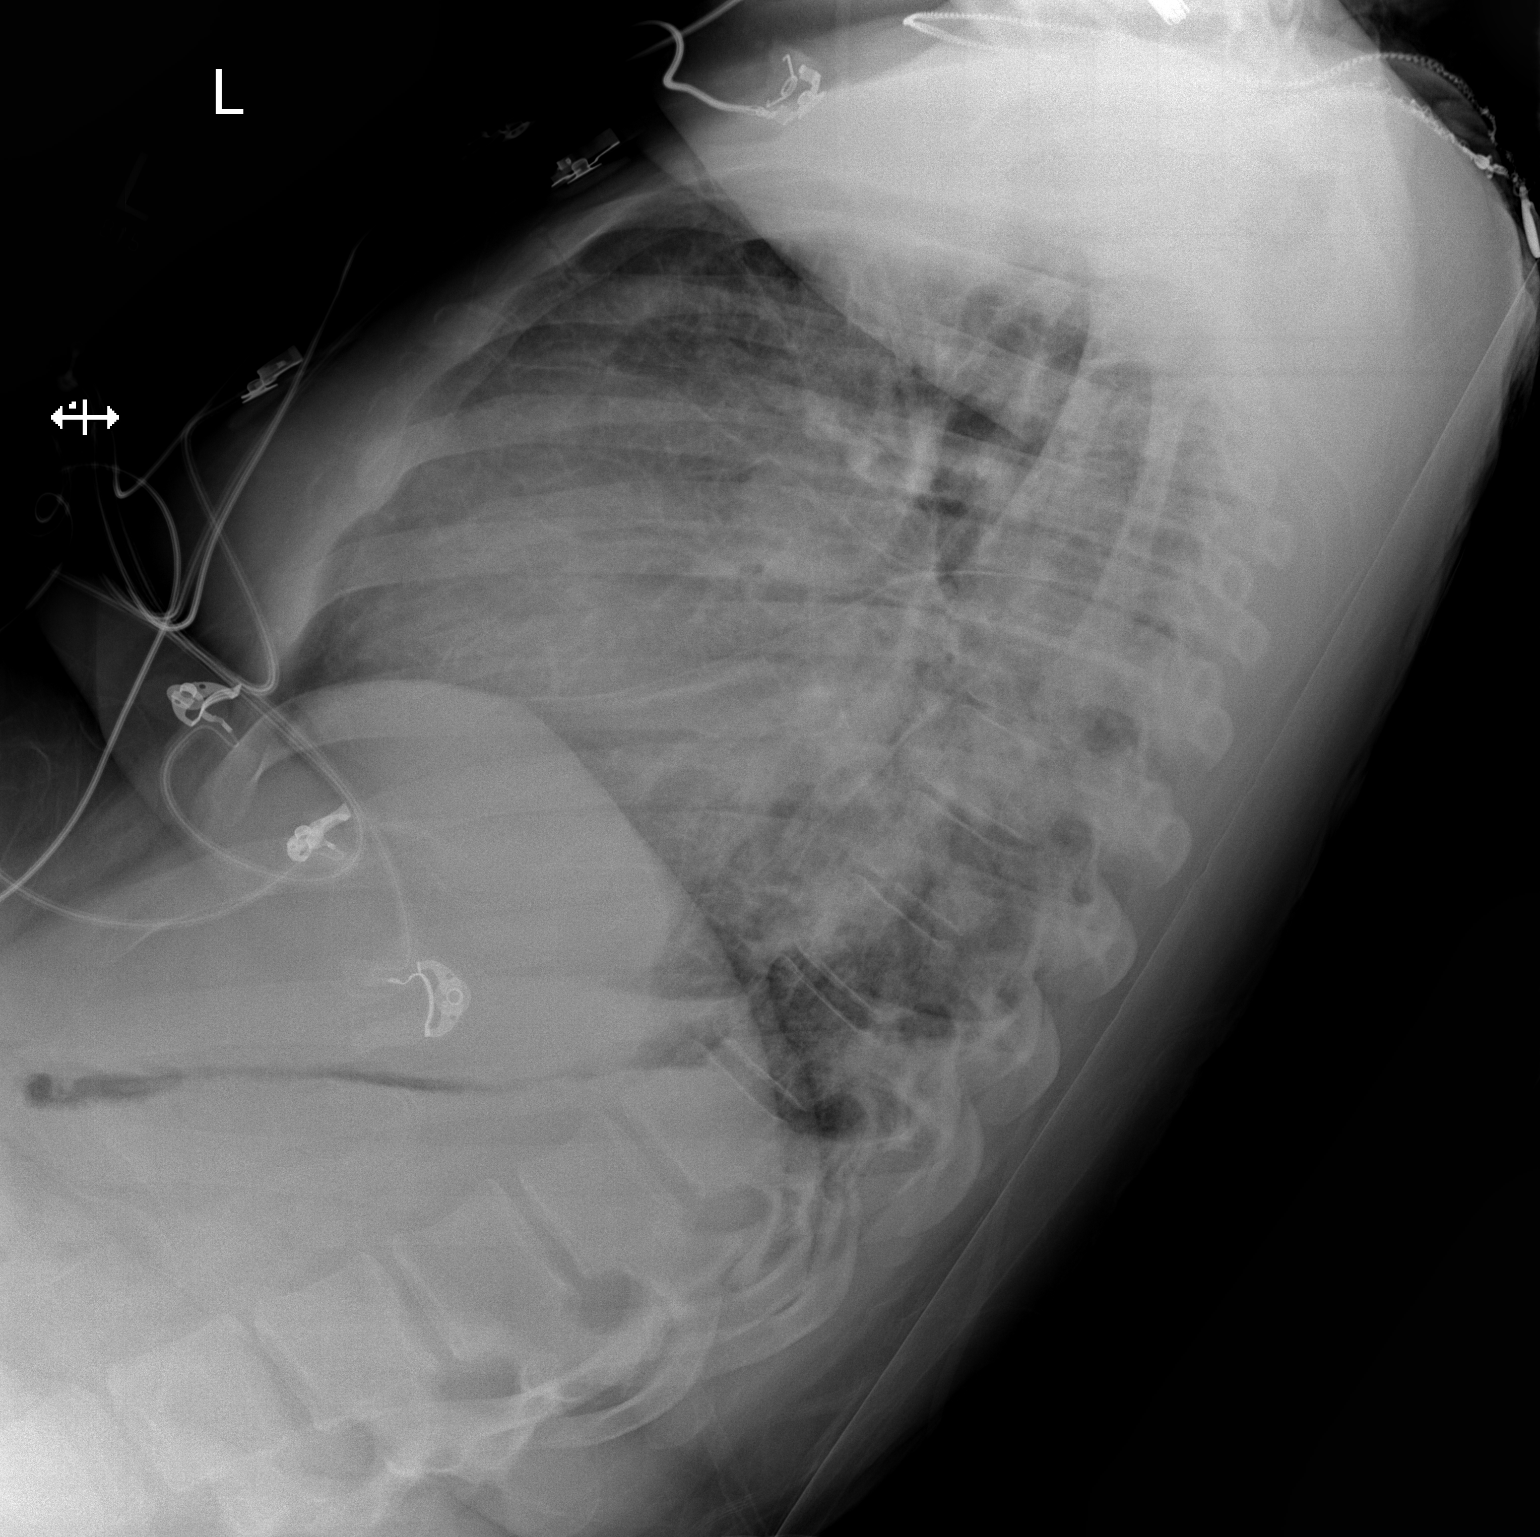

[x chest ap]
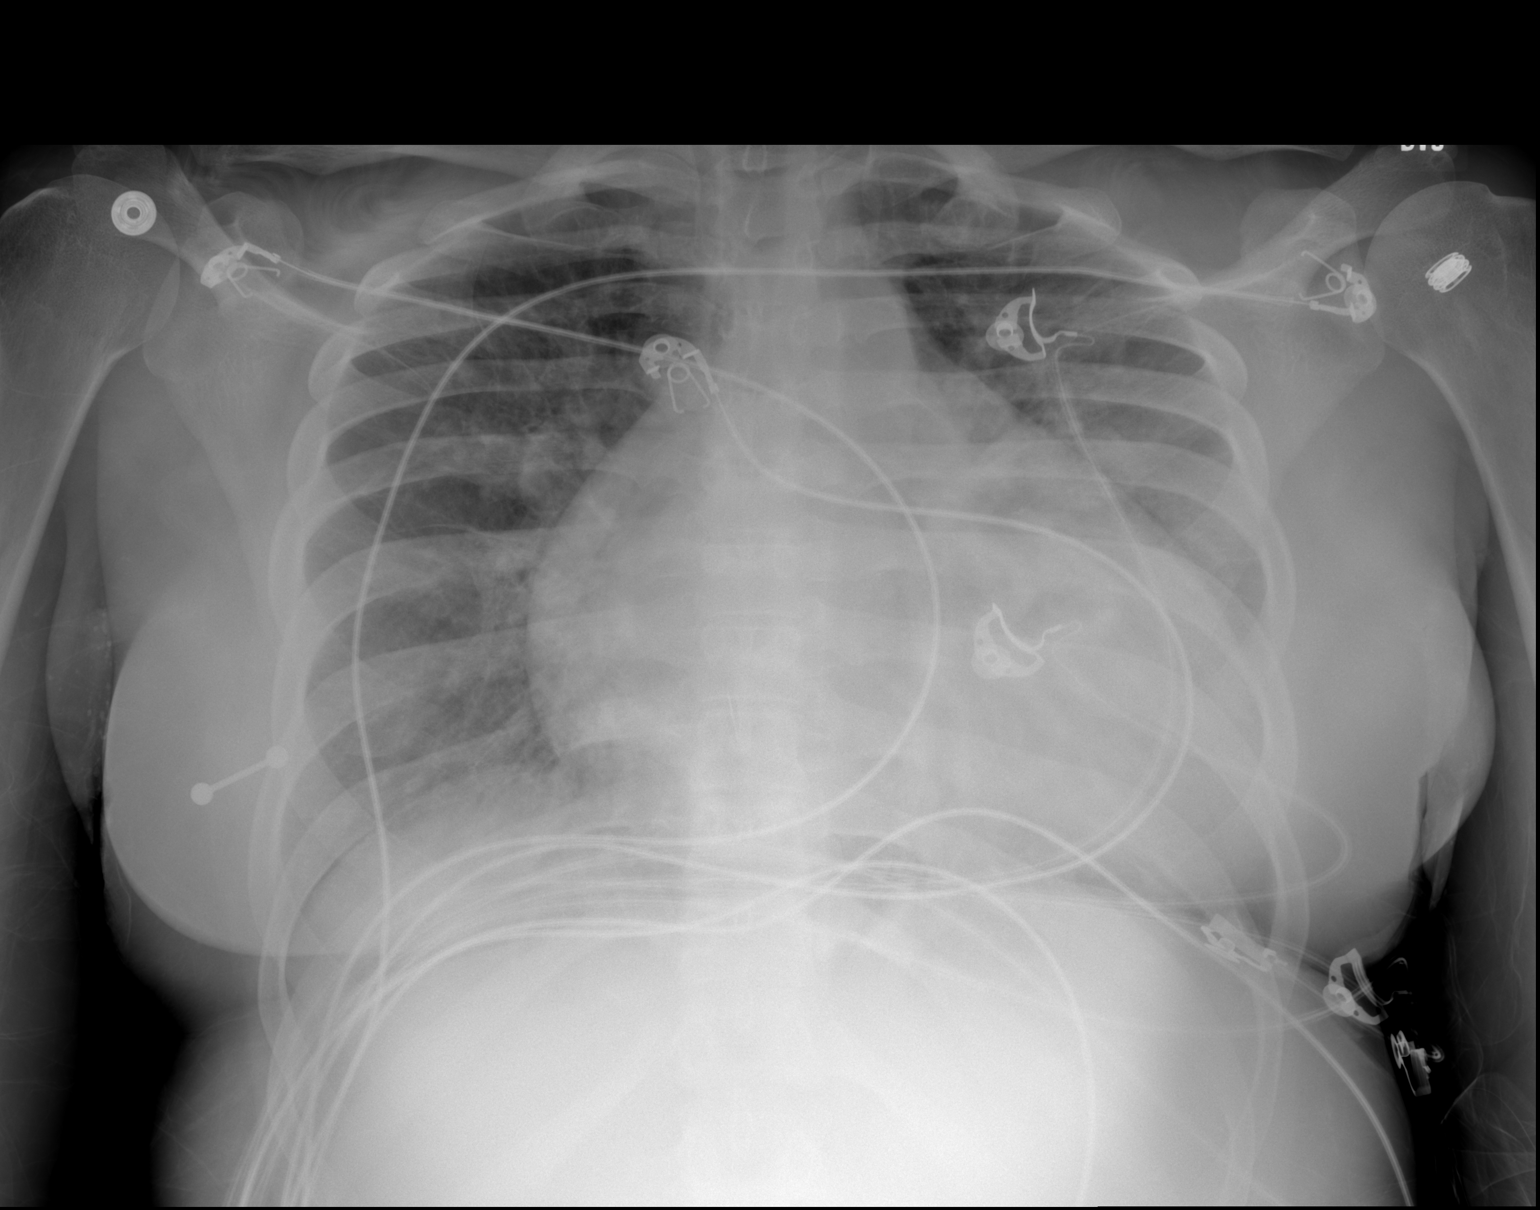

[2 of 2 positions shown; findings below may reference images not displayed]

FINDINGS: Severe cardiac enlargement with progression from the prior study.
Congestive heart failure with mild edema. No effusion or focal
consolidation.
IMPRESSION: Severe cardiac enlargement with congestive heart failure and mild
edema.

## 2019-07-06 ENCOUNTER — Ambulatory Visit (INDEPENDENT_AMBULATORY_CARE_PROVIDER_SITE_OTHER): Payer: Medicare Other | Admitting: Family Medicine

## 2019-07-06 ENCOUNTER — Other Ambulatory Visit: Payer: Self-pay

## 2019-07-06 DIAGNOSIS — I10 Essential (primary) hypertension: Secondary | ICD-10-CM

## 2019-07-06 DIAGNOSIS — F4321 Adjustment disorder with depressed mood: Secondary | ICD-10-CM

## 2019-07-06 DIAGNOSIS — F4320 Adjustment disorder, unspecified: Secondary | ICD-10-CM

## 2019-07-06 MED ORDER — HYDROXYZINE HCL 25 MG PO TABS: 25.0000 mg | ORAL_TABLET | Freq: Four times a day (QID) | ORAL | 0 refills | Status: DC

## 2019-07-06 NOTE — Patient Instructions (Addendum)
Thank you for coming to see me today. It was a pleasure! Today we talked about:   I have scheduled you an appointment with [facility] rheumatology at: [address] [place], [place] [address]  Tomorrow, 07/07/19 at 11:30 AM  If for any reason you cannot make that appointment then please call their office at [phone]. They recommend that you bring a list of your medications and wear a mask at all times during the appointment.    Please follow-up with your primary care provider in 6 weeks or sooner as needed.  If you have any questions or concerns, please do not hesitate to call the office at [phone].  Take Care,   Linda Shirley, DO  Therapy and Counseling Resources Most providers on this list will take Medicaid. Patients with commercial insurance or Medicare should contact their insurance company to get a list of in network providers.  [place] [address], [place], [place] [address]       [phone]     [facility] Psychological Services [address] [place], [place]    Jinny Blossom [facility] [address], [place], [place]  Family Solutions:  [place]. [place]  [facility] Counseling:  [address]oni [address], [place]  [facility] (under & uninsured) [address] [place] [phone]    [email or web address]    [facility] [place]     Phone:  [phone]     [place]  [place] [address]      [place], [place] [address]: [place], [place], [place]   [place] ([place] therapist) [address] [place] [address]   [place] [address]    [phone]    [facility]   [place],  [place], [place]   [place]  [place]  [facility]  [address] [place], [place]        [phone]  Open Access/Walk In Clinic under & uninsured [place],  [address] [place] [phone]):  Mon - Fri from 8 AM - 3 PM  Family Service of the [place],  ([place])   [place], [place]: [phone]) 8:30 - 12; 1 - 2:30  Family Service of the [place],  [place], [place]    [phone]):8:30 - 12; 2 - 3PM  [facility] [employer],  [address],  [place]; [phone]):   Mon - Fri 8 AM - 5 PM  [facility] [place]  MWF 12:30 to 3:00 or call to schedule an appointment  [phone]  Specific Provider options Psychology Today  https://www.psychologytoday.com/us 1. click on find a therapist  2. enter your zip code 3. left side and select or tailor a therapist for your specific need.   [facility] Provider Directory [email or web address]  (Medicaid)   Follow all drop down to find a provider  [place] or [email or web address] [address], [place], [place] Recovery support and educational   In home counseling [place] Telephone: [phone]  office  in [place] [email or web address]   Does not take reg. Medicaid or Medicare private insurance [facility], [place] health Choice, [facility], [place], [place], [place], [place] Health Choice  24- Hour Availability:  . [place] or [phone]  . Family Service of the [employer] [phone]  [facility] Crisis Service  [phone]   . [place]  [phone] (after hours)  . Therapeutic Alternative/Mobile Crisis   [phone]  . [place] National Suicide Hotline  (475)561-6328  ([place])  . Call 911 or go to emergency room  . [employer]  [phone]);  [place] and [employer]   . [employer] ACCESS  [phone]); [place], [place], [place], [place], [place], [place], [place] Support in a Crisis What if I or someone I know is in crisis?  . If you are thinking about harming yourself or having thoughts of suicide, or if you know someone who is, seek help right away. . Call your doctor or mental health care provider. . Call 911 or go to a hospital emergency room to get immediate help, or ask a friend or family member to help you do these things. . Call the [place] National Suicide Prevention Lifeline's toll-free, 24-hour hotline at 1-800-273-TALK 534-823-6767) or TTY: 1-800-799-4 TTY 502-033-1948) to talk to a trained counselor. . If you are in crisis, make sure you are not left alone.  . If someone else is in crisis, make sure he or she is not left alone  24 Hour Availability [facility]  [address], [place], [place] [address]  [phone] or [phone]  Family Service of the [employer] (Domestic Violence, Rape & Victim Assistance [phone]  [facility]  [address], [place]  [address]               [phone] or [phone]  [place]    (ONLY from 8am-4pm)    [phone]  Therapeutic Alternative Mobile Crisis Unit (24/7)   [phone]  [place] National Suicide Hotline   713-456-5420 Diamantina Monks)  Support from local police to aid getting patient to hospital (http://www.-[place].gov/index.aspx?page=2797)

## 2019-07-06 NOTE — Progress Notes (Signed)
  Subjective:  Patient ID: Linda [name]  DOB: 08/27/1996 MRN: [id number]  Linda [name] is a 23 y.o. female with a PMH of lupus, connective tissue disease, HTN, here today for knee pain.   HPI:  Bilateral Knee Pain, SLE, Mixed connective tissue disease: - Patient states her mom passed away in February 04, 2023 and since then has not been keeping doctors appointments, as he mm did all of those for her. She has felt very lost, and does not feel close to the rest of her family. - She states for the past few weeks her knees have become more painful and are intermittently swollen - she is also concerned about hr skin getting worse - she is currently on HD and being weaned off, now only going 2x/week for her h/o lupus - she has no CP, SOB, heat in the knee joints, no h/o fevers/chills, no N/V  Hypertension: - Medications: coreg, lasix, spironolactone, on HD 2x/week and being weaned as above - Compliance: yes - Checking BP at home: no - Denies any SOB, CP, vision changes, LE edema, medication SEs, or symptoms of hypotension   ROS: As mentioned in HPI  Social hx: Denies use of illicit drugs, alcohol use Smoking status reviewed  Patient Active Problem List   Diagnosis Date Noted  . Abnormal electrocardiogram 03/02/2019  . Chronic heart failure with preserved ejection fraction (HFpEF) ([place]) 03/02/2019  . ESRD on dialysis ([place]) 01/10/2019  . Calciphylaxis 12/27/2018  . Tobacco abuse 03/02/2018  . Angioedema 01/02/2018  . Tachycardia 01/02/2018  . Acute renal failure superimposed on stage 2 chronic kidney disease ([place])   . Grief reaction 08/19/2017  . Right knee pain   . Bilateral knee swelling 04/20/2017  . Lupus erythematosus   . Hypertension 12/11/2016  . Mixed connective tissue disease ([place]) 02/11/2016  . Implanon in place 01/19/2014     Objective:  BP (!) 138/100   LMP 06/19/2019 (Approximate)   Vitals and nursing note reviewed  General: NAD, pleasant Pulm: normal effort, CTAB  Cardiac: RRR, normal heart sounds, no m/r/g Extremities: no edema or cyanosis. WWP. Knees BL not swollen or erythematous  Skin: warm and dry, skin is dry and scaly  Neuro: alert and oriented, no focal deficits Psych: normal affect, normal thought content  Assessment & Plan:   Lupus erythematosus I scheduled an appointment with [facility] rheumatology at: [address] [place], [place] [address]  Tomorrow, 07/07/19 at 11:30 AM  If for any reason patient unable to make that appointment then she is to call their office to reschedule. Concern for flare but with appointment tomorrow, patient stable.   Hypertension Elevated BP 138/100 in office, but has HD Saturday and currently on aldactone and coreg. Will continue to monitor and adjust meds if remains elevated  Grief reaction Mom passed in 02-04-2023 and she is still sad and struggling with adjusting. No SI/HI. Given resources to reach out. Has family support at home. No red flags at this time.   Martinique Shirley, DO Family Medicine Resident PGY-3

## 2019-07-10 NOTE — Assessment & Plan Note (Signed)
Mom passed in May and she is still sad and struggling with adjusting. No SI/HI. Given resources to reach out. Has family support at home. No red flags at this time.

## 2019-07-10 NOTE — Assessment & Plan Note (Signed)
I scheduled an appointment with [facility] rheumatology at: [address] [place], [place] [address]  Tomorrow, 07/07/19 at 11:30 AM  If for any reason patient unable to make that appointment then she is to call their office to reschedule. Concern for flare but with appointment tomorrow, patient stable.

## 2019-07-10 NOTE — Assessment & Plan Note (Signed)
Elevated BP 138/100 in office, but has HD Saturday and currently on aldactone and coreg. Will continue to monitor and adjust meds if remains elevated

## 2019-07-18 ENCOUNTER — Telehealth: Payer: Self-pay | Admitting: *Deleted

## 2019-07-18 NOTE — Telephone Encounter (Signed)
Called pt to see about getting her flu shot scheduled.  Pt has appointment with gyn on 18th will check with them to see if they can give that to her if not she will call back and schedule this.April Zimmerman Rumple, CMA

## 2019-07-19 ENCOUNTER — Ambulatory Visit: Payer: Medicare Other | Admitting: Obstetrics

## 2019-07-26 IMAGING — US US ABDOMEN COMPLETE
1 series · 14 of 25 positions shown · non-contrast
Comparison: Plain films of earlier today.

CLINICAL DATA: Abdominal pain.

EXAM:
ABDOMEN ULTRASOUND COMPLETE

[Series 1: us abdomen complete · 0.24mm/px · 14 of 87 slices shown]
[im 1/87]
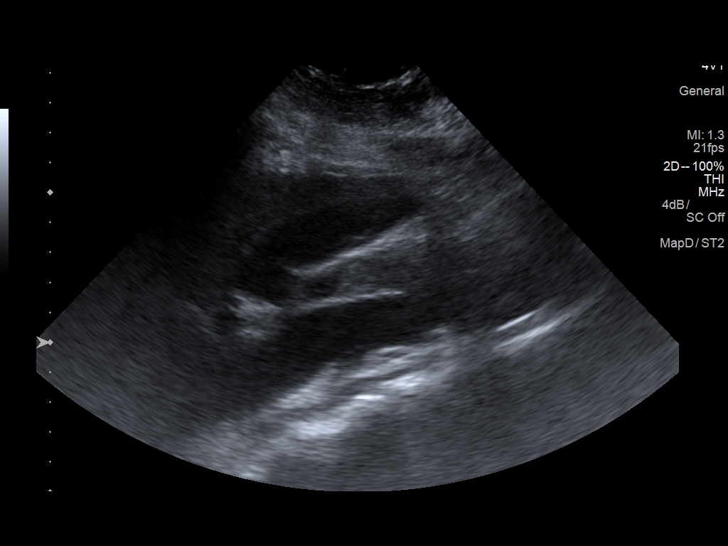
[im 8/87]
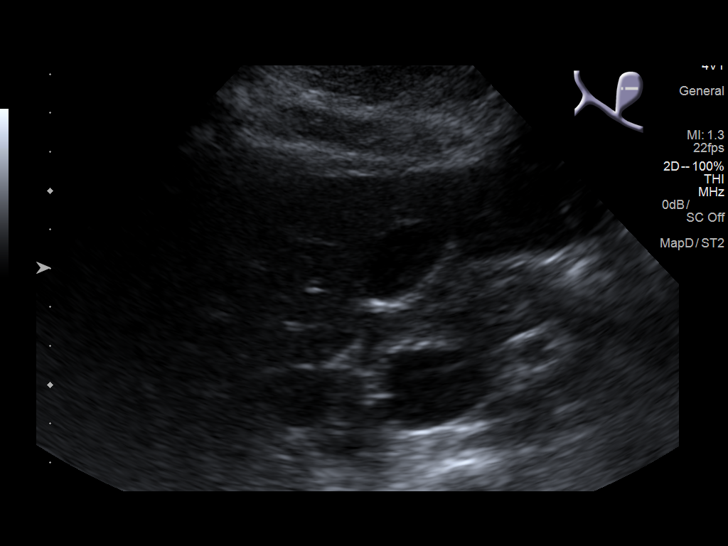
[im 15/87]
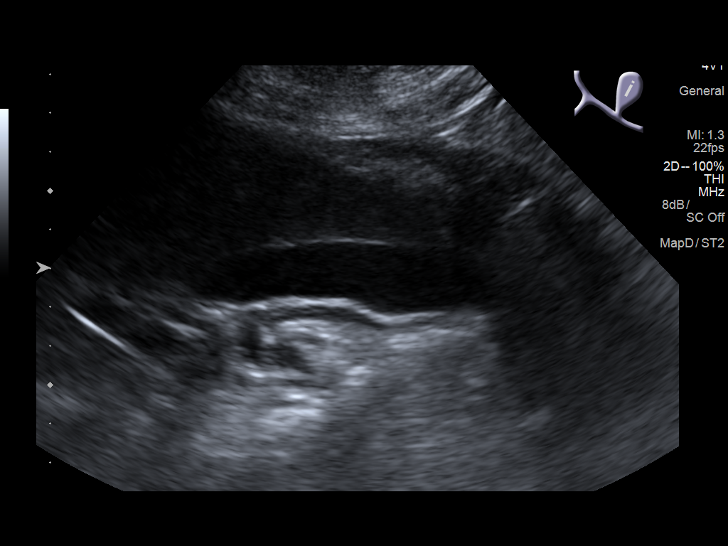
[im 22/87]
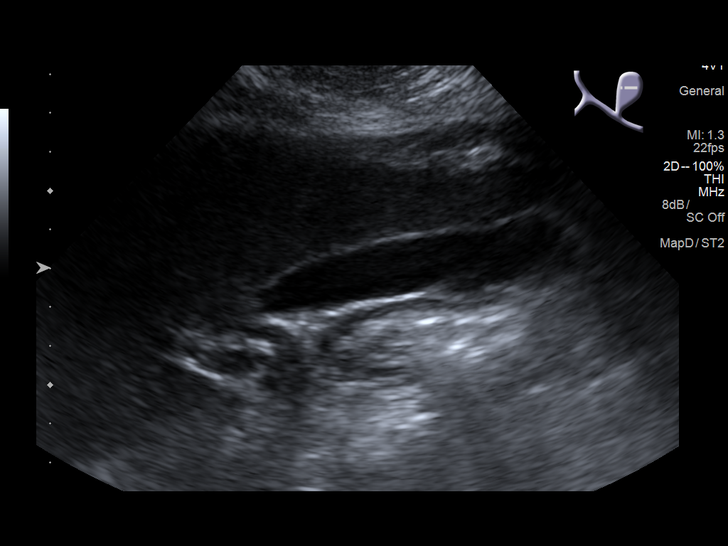
[im 29/87]
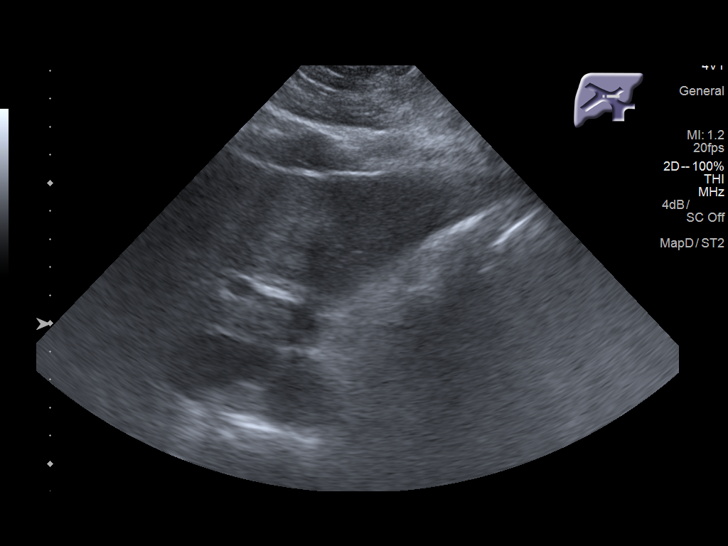
[im 33/87]
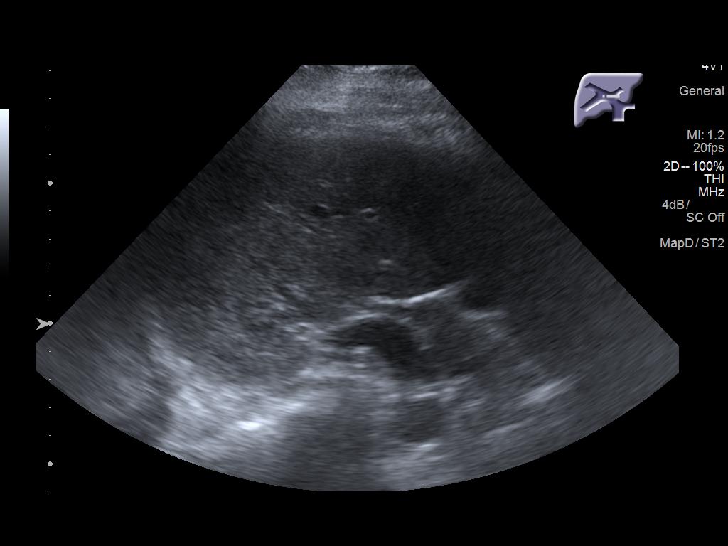
[im 40/87]
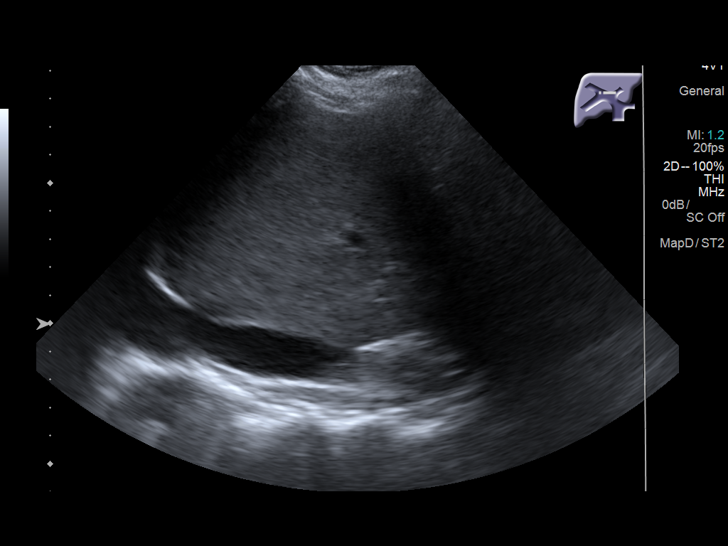
[im 47/87]
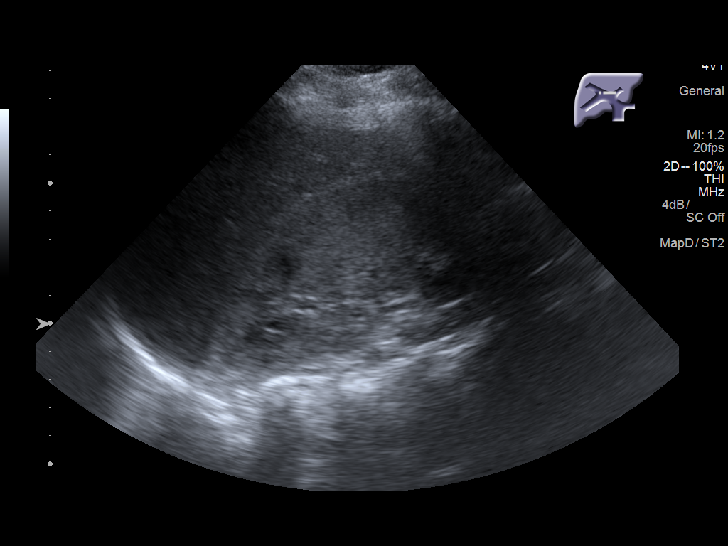
[im 54/87]
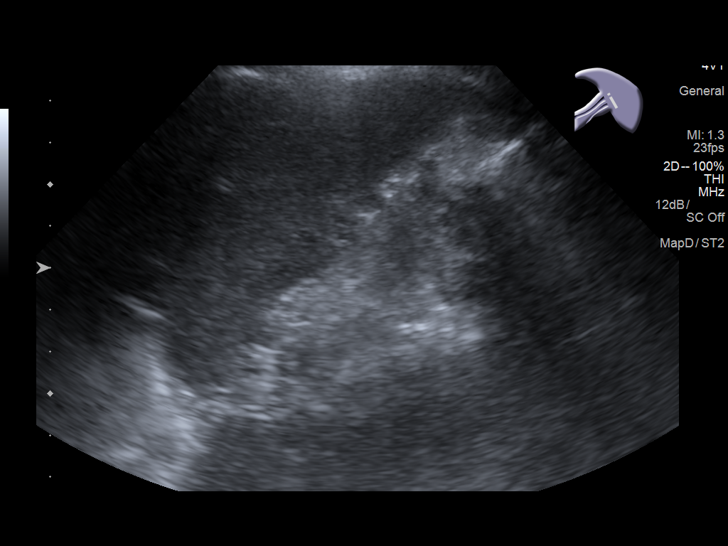
[im 58/87]
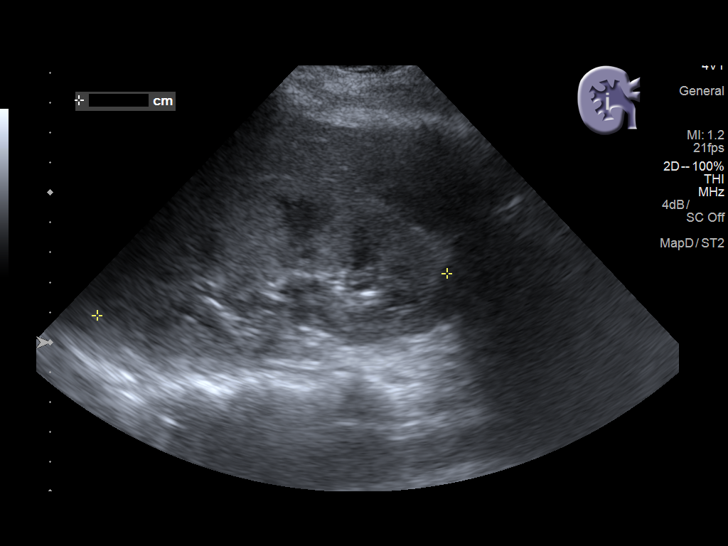
[im 65/87]
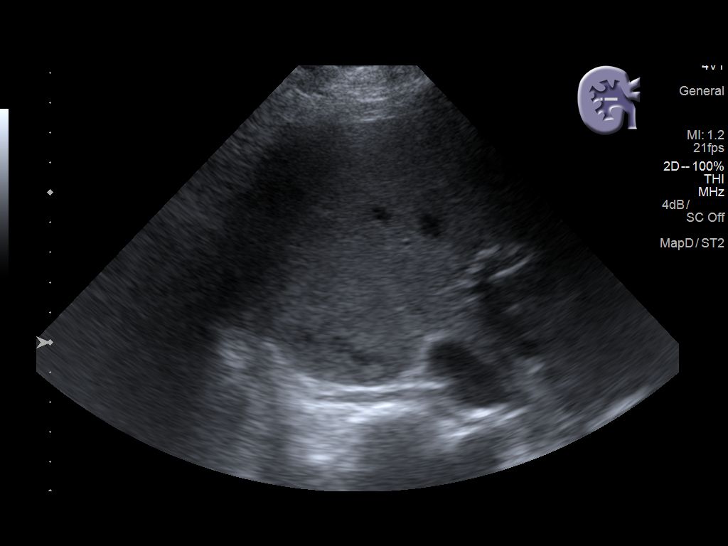
[im 72/87]
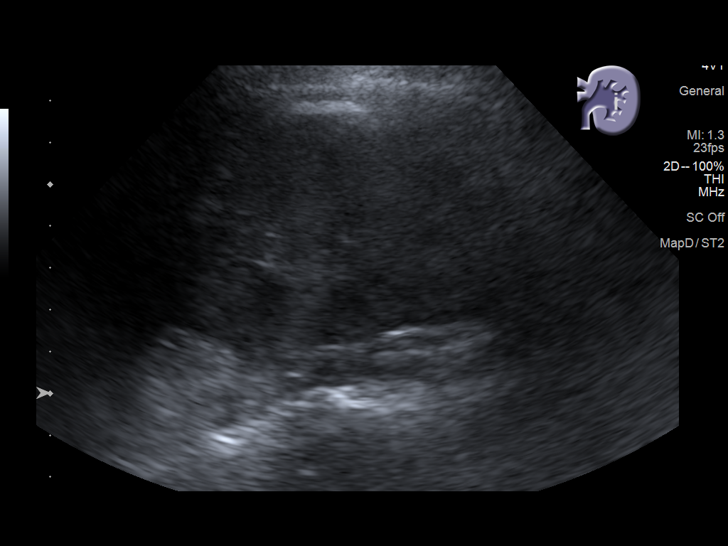
[im 79/87]
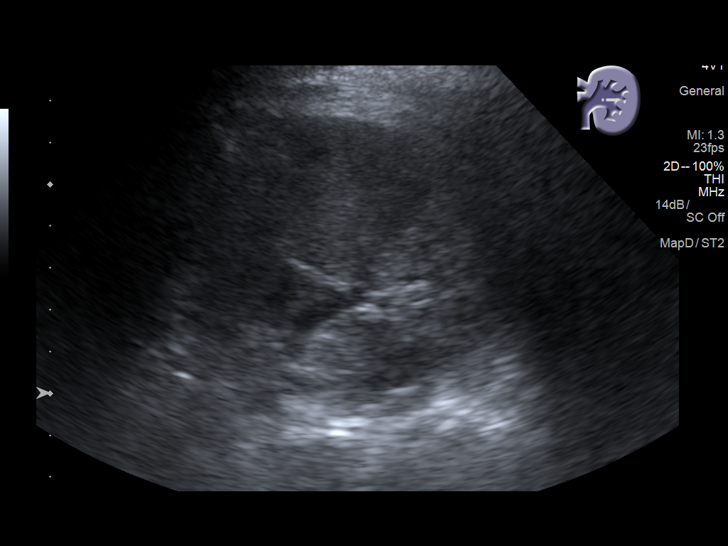
[im 87/87]
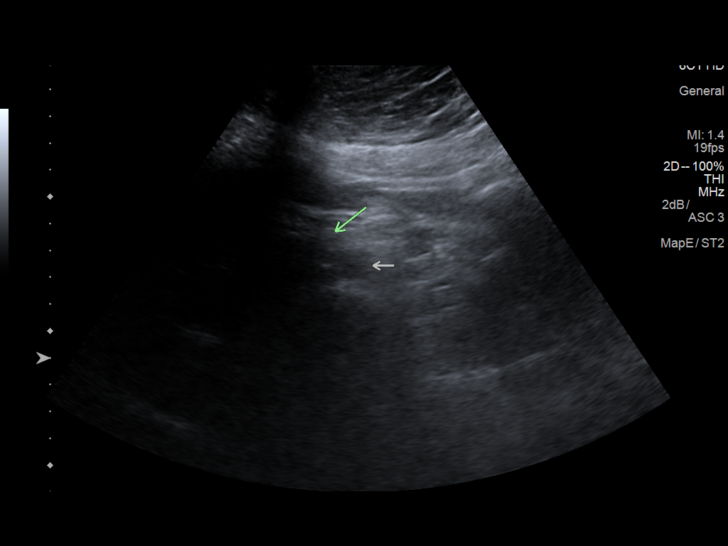

[14 of 25 positions shown; findings below may reference images not displayed]

FINDINGS: Gallbladder: No gallstones or wall thickening visualized. The
patient is tender over the gallbladder.

Common bile duct: Diameter: Normal, 3 mm.

Liver: No focal lesion identified. Within normal limits in
parenchymal echogenicity.

IVC: No abnormality visualized.

Pancreas: Partially obscured by bowel gas.

Spleen: Size and appearance within normal limits.

Right Kidney: Length: 11.8 cm. Echogenicity within normal limits. No
mass or hydronephrosis visualized.

Left Kidney: Length: 10.0 cm. Echogenicity within normal limits. No
mass or hydronephrosis visualized.

Abdominal aorta: No aneurysm visualized.

Other findings: No ascites.
IMPRESSION: 1. No specific explanation for abdominal pain.
2. Per the technologist, the patient is tender over the gallbladder.
However, there is no evidence of cholelithiasis, gallbladder wall
thickening, or pericholecystic fluid.

## 2019-08-04 ENCOUNTER — Ambulatory Visit: Payer: Medicare Other | Admitting: Obstetrics

## 2019-09-26 NOTE — Progress Notes (Deleted)
Cardiology Office Note    Date:  09/26/2019   ID:  Linda [name], DOB 10-May-1996, MRN [id number]  PCP:  Caroline More, DO  Cardiologist:  Nelva Bush, MD  Electrophysiologist:  None   Chief Complaint: Follow up  History of Present Illness:   Linda [name] is a 24 y.o. female with history of HFpEF with restrictive physiology, lupus, mixed connective tissue disease complicated by ESRD on HD, angioedema, HTN, and family history of premature coronary disease in her mother who also had lupus requiring stent placement in her mid to late 67s who presents for follow-up of ***.  She has had several hospitalizations over the past couple of years with most of her care occurring at [facility].  During these admissions, she ultimately required hemodialysis.  Several echoes were performed, most recently in 03/2018, which which showed normal LVEF with mild LVH, restrictive physiology, and moderate pulmonary hypertension.  Repeat limited study in 07/2018 to evaluate for strain showed an EF of 50 to 55%, LV global strain 12.3% with relative apical sparring noted on the strain imaging, no pericardial effusion.  She was evaluated by Dr. Saunders Revel in 03/2019 for preoperative cardiac risk ratification for planned AV fistula creation.  She noted some intermittent upper right-sided chest pain near her PermCath site.  Otherwise, she denies any symptoms concerning for chest pain.  She was noted to have inferolateral T wave inversions that were not new though more pronounced since 2016-12-16.  In this setting, she underwent Lexiscan Myoview in 03/2019 which showed no significant ischemia or scar with LVEF felt to be falsely reduced by significant extracardiac activity and appeared normal by visual estimation.  There was no significant coronary artery calcification noted on attenuation correction CT images.  Overall, this was a low risk, probably normal MPI.  ***   Labs independently reviewed: 07/2019 - potassium 4.8,  BUN 47, SCr 3.13, albumin 4.0, AST/ALT normal, HGB 12.6, PLT 143 12/2017 - TSH normal   Past Medical History:  Diagnosis Date  . Acute kidney injury ([place])   . Angioedema   . Dialysis patient ([place])    M,W, F-AS OF 03-27-19 PT STATES SHE HAS NOT BEEN TO DIALYSIS IN 2 WEEKS  . GERD (gastroesophageal reflux disease)   . Hypertension   . Lupus ([place])   . Migraines   . Mixed connective tissue disease ([place])   . Renal insufficiency 03/07/2019   ESRD  . Septic prepatellar bursitis of right knee 08/19/2017    Past Surgical History:  Procedure Laterality Date  . GRAFT APPLICATION Left 0000000   Procedure: BRACHIAL CEPHALIC GRAFT CREATION;  Surgeon: Algernon Huxley, MD;  Location: [facility] ORS;  Service: Vascular;  Laterality: Left;  . I & D EXTREMITY Right 08/06/2017   Procedure: IRRIGATION AND DEBRIDEMENT KNEE;  Surgeon: Shona Needles, MD;  Location: [facility];  Service: Orthopedics;  Laterality: Right;  . MULTIPLE TOOTH EXTRACTIONS      Current Medications: No outpatient medications have been marked as taking for the 09/29/19 encounter (Appointment) with Rise Mu, PA-C.    Allergies:   Lisinopril and Vicodin [hydrocodone-acetaminophen]   Social History   Socioeconomic History  . Marital status: Single    Spouse name: Not on file  . Number of children: Not on file  . Years of education: Not on file  . Highest education level: Not on file  Occupational History  . Not on file  Tobacco Use  . Smoking status: Current Every Day Smoker  Packs/day: 0.25    Years: 5.00    Pack years: 1.25    Types: Cigarettes, Cigars    Start date: 02/11/2015  . Smokeless tobacco: Never Used  Substance and Sexual Activity  . Alcohol use: No    Alcohol/week: 0.0 standard drinks  . Drug use: No  . Sexual activity: Yes    Partners: Male    Birth control/protection: Implant  Other Topics Concern  . Not on file  Social History Narrative   Lives with boyfriend   Social Determinants of Health    Financial Resource Strain:   . Difficulty of Paying Living Expenses: Not on file  Food Insecurity:   . Worried About Charity fundraiser in the Last Year: Not on file  . Ran Out of Food in the Last Year: Not on file  Transportation Needs:   . Lack of Transportation (Medical): Not on file  . Lack of Transportation (Non-Medical): Not on file  Physical Activity:   . Days of Exercise per Week: Not on file  . Minutes of Exercise per Session: Not on file  Stress:   . Feeling of Stress : Not on file  Social Connections:   . Frequency of Communication with Friends and Family: Not on file  . Frequency of Social Gatherings with Friends and Family: Not on file  . Attends Religious Services: Not on file  . Active Member of Clubs or Organizations: Not on file  . Attends Archivist Meetings: Not on file  . Marital Status: Not on file     Family History:  The patient's family history includes Coronary artery disease (age of onset: 83) in her mother; Diabetes in her maternal grandmother; Heart Problems in her mother; Hypertension in her mother; Kidney disease in her mother; Lupus in her mother.  ROS:   ROS   EKGs/Labs/Other Studies Reviewed:    Studies reviewed were summarized above. The additional studies were reviewed today:  MPI 03/2019:  Low risk, probably normal pharmacologic myocardial perfusion stress test.  There is no significant ischemia or scar.  Calculated left ventricular systolic function is mildly to moderately reduced but is likely affected by significant extracardiac activity (48% by Siemens calculation, 38% by QGS). LVEF appears normal by visual estimation.  There is no significant coronary artery calcification noted on the attenuation correction CT.  Central venous catheter is noted in the SVC. __________  2D Echo 11/2017: - Left ventricle: Wall thickness was increased in a pattern of   moderate LVH. Systolic function was normal. The estimated    ejection fraction was in the range of 50% to 55%. Left   ventricular diastolic function parameters were normal. - Aortic valve: There was trivial regurgitation. - Mitral valve: There was mild regurgitation. - Left atrium: The atrium was moderately dilated. - Right atrium: The atrium was mildly dilated. - Atrial septum: No defect or patent foramen ovale was identified. - Pericardium, extracardiac: Small mostly posterior pericardial   effusionj no tamponade. Some speckling to LV myocardium  EKG:  EKG is ordered today.  The EKG ordered today demonstrates ***  Recent Labs: 03/27/2019: BUN 46; Creatinine, Ser 3.69; Platelets 157 03/30/2019: Hemoglobin 15.3; Potassium 3.0; Sodium 137  Recent Lipid Panel No results found for: CHOL, TRIG, HDL, CHOLHDL, VLDL, LDLCALC, LDLDIRECT  PHYSICAL EXAM:    VS:  There were no vitals taken for this visit.  BMI: There is no height or weight on file to calculate BMI.  Physical Exam  Wt Readings from Last  3 Encounters:  03/01/19 164 lb (74.4 kg)  01/10/19 166 lb (75.3 kg)  10/06/18 168 lb (76.2 kg)     ASSESSMENT & PLAN:   1. ***  Disposition: F/u with Dr. Saunders Revel or an APP in ***.   Medication Adjustments/Labs and Tests Ordered: Current medicines are reviewed at length with the patient today.  Concerns regarding medicines are outlined above. Medication changes, Labs and Tests ordered today are summarized above and listed in the Patient Instructions accessible in Encounters.   Signed, Christell Faith, PA-C 09/26/2019 4:25 PM     [address] [place] [place], [place] [address] [phone]

## 2019-09-29 ENCOUNTER — Ambulatory Visit: Payer: Medicare Other | Admitting: Physician Assistant

## 2019-09-29 NOTE — Progress Notes (Deleted)
Cardiology Office Note    Date:  09/29/2019   ID:  [name], DOB 05/22/1996, MRN [id number]  PCP:  Caroline More, DO  Cardiologist:  Nelva Bush, MD  Electrophysiologist:  None   Chief Complaint: Follow-up  History of Present Illness:   Linda [name] is a 24 y.o. female with history of HFpEF with restrictive physiology, lupus, mixed connective tissue disease complicated by ESRD on HD, angioedema, HTN, and family history of premature coronary disease in her mother who also had lupus requiring stent placement in her mid to late 62s who presents for follow-up of ***.  She has had several hospitalizations over the past couple of years with most of her care occurring at [facility].  During these admissions, she ultimately required hemodialysis.  Several echoes were performed, most recently in 03/2018, which which showed normal LVEF with mild LVH, restrictive physiology, and moderate pulmonary hypertension.  Repeat limited study in 07/2018 to evaluate for strain showed an EF of 50 to 55%, LV global strain 12.3% with relative apical sparring noted on the strain imaging, no pericardial effusion.  She was evaluated by Dr. Saunders Revel in 03/2019 for preoperative cardiac risk ratification for planned AV fistula creation.  She noted some intermittent upper right-sided chest pain near her PermCath site.  Otherwise, she denies any symptoms concerning for chest pain.  She was noted to have inferolateral T wave inversions that were not new though more pronounced since 11-30-2016.  In this setting, she underwent Linda [name] in 03/2019 which showed no significant ischemia or scar with LVEF felt to be falsely reduced by significant extracardiac activity and appeared normal by visual estimation.  There was no significant coronary artery calcification noted on attenuation correction CT images.  Overall, this was a low risk, probably normal MPI.  ***   Labs independently reviewed: 07/2019 - potassium 4.8,  BUN 47, SCr 3.13, albumin 4.0, AST/ALT normal, HGB 12.6, PLT 143 12/2017 - TSH normal   Past Medical History:  Diagnosis Date  . Acute kidney injury ([place])   . Angioedema   . Dialysis patient ([place])    M,W, F-AS OF 03-27-19 PT STATES SHE HAS NOT BEEN TO DIALYSIS IN 2 WEEKS  . GERD (gastroesophageal reflux disease)   . Hypertension   . Lupus ([place])   . Migraines   . Mixed connective tissue disease ([place])   . Renal insufficiency 03/07/2019   ESRD  . Septic prepatellar bursitis of right knee 08/19/2017    Past Surgical History:  Procedure Laterality Date  . GRAFT APPLICATION Left 0000000   Procedure: BRACHIAL CEPHALIC GRAFT CREATION;  Surgeon: Algernon Huxley, MD;  Location: [facility] ORS;  Service: Vascular;  Laterality: Left;  . I & D EXTREMITY Right 08/06/2017   Procedure: IRRIGATION AND DEBRIDEMENT KNEE;  Surgeon: Shona Needles, MD;  Location: [facility];  Service: Orthopedics;  Laterality: Right;  . MULTIPLE TOOTH EXTRACTIONS      Current Medications: No outpatient medications have been marked as taking for the 10/04/19 encounter (Appointment) with Rise Mu, PA-C.    Allergies:   Lisinopril and Vicodin [hydrocodone-acetaminophen]   Social History   Socioeconomic History  . Marital status: Single    Spouse name: Not on file  . Number of children: Not on file  . Years of education: Not on file  . Highest education level: Not on file  Occupational History  . Not on file  Tobacco Use  . Smoking status: Current Every Day Smoker  Packs/day: 0.25    Years: 5.00    Pack years: 1.25    Types: Cigarettes, Cigars    Start date: 02/11/2015  . Smokeless tobacco: Never Used  Substance and Sexual Activity  . Alcohol use: No    Alcohol/week: 0.0 standard drinks  . Drug use: No  . Sexual activity: Yes    Partners: Male    Birth control/protection: Implant  Other Topics Concern  . Not on file  Social History Narrative   Lives with boyfriend   Social Determinants of Health    Financial Resource Strain:   . Difficulty of Paying Living Expenses: Not on file  Food Insecurity:   . Worried About Charity fundraiser in the Last Year: Not on file  . Ran Out of Food in the Last Year: Not on file  Transportation Needs:   . Lack of Transportation (Medical): Not on file  . Lack of Transportation (Non-Medical): Not on file  Physical Activity:   . Days of Exercise per Week: Not on file  . Minutes of Exercise per Session: Not on file  Stress:   . Feeling of Stress : Not on file  Social Connections:   . Frequency of Communication with Friends and Family: Not on file  . Frequency of Social Gatherings with Friends and Family: Not on file  . Attends Religious Services: Not on file  . Active Member of Clubs or Organizations: Not on file  . Attends Archivist Meetings: Not on file  . Marital Status: Not on file     Family History:  The patient's family history includes Coronary artery disease (age of onset: 45) in her mother; Diabetes in her maternal grandmother; Heart Problems in her mother; Hypertension in her mother; Kidney disease in her mother; Lupus in her mother.  ROS:   ROS   EKGs/Labs/Other Studies Reviewed:    Studies reviewed were summarized above. The additional studies were reviewed today:  MPI 03/2019:  Low risk, probably normal pharmacologic myocardial perfusion stress test.  There is no significant ischemia or scar.  Calculated left ventricular systolic function is mildly to moderately reduced but is likely affected by significant extracardiac activity (48% by Siemens calculation, 38% by QGS). LVEF appears normal by visual estimation.  There is no significant coronary artery calcification noted on the attenuation correction CT.  Central venous catheter is noted in the SVC. __________  2D Echo 11/2017: - Left ventricle: Wall thickness was increased in a pattern of   moderate LVH. Systolic function was normal. The estimated   ejection  fraction was in the range of 50% to 55%. Left   ventricular diastolic function parameters were normal. - Aortic valve: There was trivial regurgitation. - Mitral valve: There was mild regurgitation. - Left atrium: The atrium was moderately dilated. - Right atrium: The atrium was mildly dilated. - Atrial septum: No defect or patent foramen ovale was identified. - Pericardium, extracardiac: Small mostly posterior pericardial   effusionj no tamponade. Some speckling to LV myocardium   EKG:  EKG is ordered today.  The EKG ordered today demonstrates ***  Recent Labs: 03/27/2019: BUN 46; Creatinine, Ser 3.69; Platelets 157 03/30/2019: Hemoglobin 15.3; Potassium 3.0; Sodium 137  Recent Lipid Panel No results found for: CHOL, TRIG, HDL, CHOLHDL, VLDL, LDLCALC, LDLDIRECT  PHYSICAL EXAM:    VS:  There were no vitals taken for this visit.  BMI: There is no height or weight on file to calculate BMI.  Physical Exam  Wt Readings from  Last 3 Encounters:  03/01/19 164 lb (74.4 kg)  01/10/19 166 lb (75.3 kg)  10/06/18 168 lb (76.2 kg)     ASSESSMENT & PLAN:   1. ***  Disposition: F/u with Dr. Saunders Revel or an APP in ***.   Medication Adjustments/Labs and Tests Ordered: Current medicines are reviewed at length with the patient today.  Concerns regarding medicines are outlined above. Medication changes, Labs and Tests ordered today are summarized above and listed in the Patient Instructions accessible in Encounters.   Signed, Christell Faith, PA-C 09/29/2019 1:44 PM     [place] [place], [place] [address] [phone]

## 2019-10-04 ENCOUNTER — Ambulatory Visit: Payer: Medicare Other | Admitting: Physician Assistant

## 2019-10-05 ENCOUNTER — Encounter: Payer: Self-pay | Admitting: Physician Assistant

## 2019-10-14 NOTE — Progress Notes (Deleted)
Cardiology Office Note    Date:  10/14/2019   ID:  [name], DOB 16-Apr-1996, MRN [id number]  PCP:  Caroline More, DO  Cardiologist:  Nelva Bush, MD  Electrophysiologist:  None   Chief Complaint: Follow up  History of Present Illness:   Linda [name] is a 24 y.o. female with history of HFpEF with restrictive physiology, lupus, mixed connective tissue disease complicated by ESRD on HD, angioedema, HTN, and family history of premature coronary disease in her mother who also had lupus requiring stent placement in her mid to late 61s who presents for follow-up of ***.  She has had several hospitalizations over the past couple of years with most of her care occurring at [facility].  During these admissions, she ultimately required hemodialysis.  Several echoes were performed, most recently in 03/2018, which which showed normal LVEF with mild LVH, restrictive physiology, and moderate pulmonary hypertension.  Repeat limited study in 07/2018 to evaluate for strain showed an EF of 50 to 55%, LV global strain 12.3% with relative apical sparring noted on the strain imaging, no pericardial effusion.  She was evaluated by Dr. Saunders Revel in 03/2019 for preoperative cardiac risk ratification for planned AV fistula creation.  She noted some intermittent upper right-sided chest pain near her PermCath site.  Otherwise, she denies any symptoms concerning for chest pain.  She was noted to have inferolateral T wave inversions that were not new though more pronounced since 2016-12-01.  In this setting, she underwent Lexiscan Myoview in 03/2019 which showed no significant ischemia or scar with LVEF felt to be falsely reduced by significant extracardiac activity and appeared normal by visual estimation.  There was no significant coronary artery calcification noted on attenuation correction CT images.  Overall, this was a low risk, probably normal MPI.  ***   Labs independently reviewed: 07/2019 - potassium 4.8,  BUN 47, SCr 3.13, albumin 4.0, AST/ALT normal, HGB 12.6, PLT 143 12/2017 - TSH normal   Past Medical History:  Diagnosis Date  . Acute kidney injury ([place])   . Angioedema   . Dialysis patient ([place])    M,W, F-AS OF 03-27-19 PT STATES SHE HAS NOT BEEN TO DIALYSIS IN 2 WEEKS  . GERD (gastroesophageal reflux disease)   . Hypertension   . Lupus ([place])   . Migraines   . Mixed connective tissue disease ([place])   . Renal insufficiency 03/07/2019   ESRD  . Septic prepatellar bursitis of right knee 08/19/2017    Past Surgical History:  Procedure Laterality Date  . GRAFT APPLICATION Left 0000000   Procedure: BRACHIAL CEPHALIC GRAFT CREATION;  Surgeon: Algernon Huxley, MD;  Location: [facility];  Service: Vascular;  Laterality: Left;  . I & D EXTREMITY Right 08/06/2017   Procedure: IRRIGATION AND DEBRIDEMENT KNEE;  Surgeon: Shona Needles, MD;  Location: [place];  Service: Orthopedics;  Laterality: Right;  . MULTIPLE TOOTH EXTRACTIONS      Current Medications: No outpatient medications have been marked as taking for the 10/24/19 encounter (Appointment) with Rise Mu, PA-C.    Allergies:   Lisinopril and Vicodin [hydrocodone-acetaminophen]   Social History   Socioeconomic History  . Marital status: Single    Spouse name: Not on file  . Number of children: Not on file  . Years of education: Not on file  . Highest education level: Not on file  Occupational History  . Not on file  Tobacco Use  . Smoking status: Current Every Day Smoker  Packs/day: 0.25    Years: 5.00    Pack years: 1.25    Types: Cigarettes, Cigars    Start date: 02/11/2015  . Smokeless tobacco: Never Used  Substance and Sexual Activity  . Alcohol use: No    Alcohol/week: 0.0 standard drinks  . Drug use: No  . Sexual activity: Yes    Partners: Male    Birth control/protection: Implant  Other Topics Concern  . Not on file  Social History Narrative   Lives with boyfriend   Social Determinants of Health    Financial Resource Strain:   . Difficulty of Paying Living Expenses: Not on file  Food Insecurity:   . Worried About Charity fundraiser in the Last Year: Not on file  . Ran Out of Food in the Last Year: Not on file  Transportation Needs:   . Lack of Transportation (Medical): Not on file  . Lack of Transportation (Non-Medical): Not on file  Physical Activity:   . Days of Exercise per Week: Not on file  . Minutes of Exercise per Session: Not on file  Stress:   . Feeling of Stress : Not on file  Social Connections:   . Frequency of Communication with Friends and Family: Not on file  . Frequency of Social Gatherings with Friends and Family: Not on file  . Attends Religious Services: Not on file  . Active Member of Clubs or Organizations: Not on file  . Attends Archivist Meetings: Not on file  . Marital Status: Not on file     Family History:  The patient's family history includes Coronary artery disease (age of onset: 71) in her mother; Diabetes in her maternal grandmother; Heart Problems in her mother; Hypertension in her mother; Kidney disease in her mother; Lupus in her mother.  ROS:   ROS   EKGs/Labs/Other Studies Reviewed:    Studies reviewed were summarized above. The additional studies were reviewed today:  MPI 03/2019:  Low risk, probably normal pharmacologic myocardial perfusion stress test.  There is no significant ischemia or scar.  Calculated left ventricular systolic function is mildly to moderately reduced but is likely affected by significant extracardiac activity (48% by Siemens calculation, 38% by QGS). LVEF appears normal by visual estimation.  There is no significant coronary artery calcification noted on the attenuation correction CT.  Central venous catheter is noted in the SVC. __________  2D Echo 11/2017: - Left ventricle: Wall thickness was increased in a pattern of   moderate LVH. Systolic function was normal. The estimated   ejection  fraction was in the range of 50% to 55%. Left   ventricular diastolic function parameters were normal. - Aortic valve: There was trivial regurgitation. - Mitral valve: There was mild regurgitation. - Left atrium: The atrium was moderately dilated. - Right atrium: The atrium was mildly dilated. - Atrial septum: No defect or patent foramen ovale was identified. - Pericardium, extracardiac: Small mostly posterior pericardial   effusion, no tamponade. Some speckling to LV myocardium   EKG:  EKG is ordered today.  The EKG ordered today demonstrates ***  Recent Labs: 03/27/2019: BUN 46; Creatinine, Ser 3.69; Platelets 157 03/30/2019: Hemoglobin 15.3; Potassium 3.0; Sodium 137  Recent Lipid Panel No results found for: CHOL, TRIG, HDL, CHOLHDL, VLDL, LDLCALC, LDLDIRECT  PHYSICAL EXAM:    VS:  There were no vitals taken for this visit.  BMI: There is no height or weight on file to calculate BMI.  Physical Exam  Wt Readings from  Last 3 Encounters:  03/01/19 164 lb (74.4 kg)  01/10/19 166 lb (75.3 kg)  10/06/18 168 lb (76.2 kg)     ASSESSMENT & PLAN:   1. ***  Disposition: F/u with Dr. Saunders Revel or an APP in ***.   Medication Adjustments/Labs and Tests Ordered: Current medicines are reviewed at length with the patient today.  Concerns regarding medicines are outlined above. Medication changes, Labs and Tests ordered today are summarized above and listed in the Patient Instructions accessible in Encounters.   Signed, Christell Faith, PA-C 10/14/2019 11:28 AM     [facility] [address] [place], [place] [address] [phone]

## 2019-10-24 ENCOUNTER — Ambulatory Visit: Payer: Medicare Other | Admitting: Physician Assistant

## 2019-10-25 ENCOUNTER — Encounter: Payer: Self-pay | Admitting: Physician Assistant

## 2019-11-01 ENCOUNTER — Encounter: Payer: Self-pay | Admitting: Nurse Practitioner

## 2019-11-01 ENCOUNTER — Ambulatory Visit (INDEPENDENT_AMBULATORY_CARE_PROVIDER_SITE_OTHER): Payer: Medicare Other | Admitting: Nurse Practitioner

## 2019-11-01 ENCOUNTER — Other Ambulatory Visit: Payer: Self-pay

## 2019-11-01 VITALS — BP 140/82 | HR 62 | Ht 66.0 in | Wt 168.8 lb

## 2019-11-01 DIAGNOSIS — N186 End stage renal disease: Secondary | ICD-10-CM | POA: Diagnosis not present

## 2019-11-01 DIAGNOSIS — I10 Essential (primary) hypertension: Secondary | ICD-10-CM | POA: Diagnosis not present

## 2019-11-01 DIAGNOSIS — I5032 Chronic diastolic (congestive) heart failure: Secondary | ICD-10-CM | POA: Diagnosis not present

## 2019-11-01 DIAGNOSIS — I517 Cardiomegaly: Secondary | ICD-10-CM

## 2019-11-01 NOTE — Progress Notes (Signed)
Office Visit    Patient Name: Linda [name] Date of Encounter: 11/01/2019  Primary Care Provider:  Caroline More, DO Primary Cardiologist:  Nelva Bush, MD  Chief Complaint    24 year old female with a history of lupus, mixed connective tissue disease, end-stage renal disease, angioedema, hypertension, LVH, and abnormal EKG with normal stress testing in July 2020, who presents for follow-up of hypertension.  Past Medical History    Past Medical History:  Diagnosis Date  . Abnormal ECG    a. In setting of LVH w/ repolarization abnormalities; b. 03/2019 MV: No ischemia/infarct. EF 55-65%.  . Angioedema   . ESRD (end stage renal disease) ([place])    a. prev on HD.  Marland Kitchen GERD (gastroesophageal reflux disease)   . Hypertension   . Lupus ([place])   . LVH (left ventricular hypertrophy)    a. 11/2017 Echo: EF 50-55%. triv AI. Mild MR. Mod L and mild R atrial enlargement. Nl RV size/fxn. Small pericardial effusion w/o tampondae; b. 03/2018 Echo: EF 55-60%, restrictive filling pattern. Nl RV size/fxn. Sev LAE. Mild MR, mild to mod TR/PR. Mod PAH. Triv effusion.  . Migraines   . Mixed connective tissue disease ([place])   . Previous Dialysis patient [facility])    a. Off HD since late 2020.  Marland Kitchen Septic prepatellar bursitis of right knee 08/19/2017   Past Surgical History:  Procedure Laterality Date  . GRAFT APPLICATION Left 0000000   Procedure: BRACHIAL CEPHALIC GRAFT CREATION;  Surgeon: Algernon Huxley, MD;  Location: [facility];  Service: Vascular;  Laterality: Left;  . I & D EXTREMITY Right 08/06/2017   Procedure: IRRIGATION AND DEBRIDEMENT KNEE;  Surgeon: Shona Needles, MD;  Location: [place];  Service: Orthopedics;  Laterality: Right;  . MULTIPLE TOOTH EXTRACTIONS      Allergies  Allergies  Allergen Reactions  . Lisinopril Anaphylaxis    Angioedema  . Vicodin [Hydrocodone-Acetaminophen] Itching and Rash    History of Present Illness    24 year old female with the above complex past  medical history including lupus and mixed connective tissue disease complicated by end-stage renal disease previously on hemodialysis, hypertension, LVH, and abnormal EKG. She also has a family history of premature CAD with her mother requiring stenting in her mid to late 98s. Previous echocardiography in April 2019 and more recently August 2019, showed normal LV function with an EF of 55-60%, restrictive filling, severe left atrial enlargement, mild MR, mild to moderate TR, moderate pulmonary hypertension, mild to moderate pulmonic regurgitation, and trivial pericardial effusion. She was seen by Dr. Saunders Revel in July 2020 for preoperative evaluation as she was pending AV fistula. ECG was abnormal with inferolateral T wave inversions in the setting of LVH. Given family history of premature CAD, stress testing was undertaken and was low risk/nonischemic.  Since her last visit, she had remained on hemodialysis but then in late 2020, she was able to come off.  She is being followed by Dr. Moshe Cipro in [place].  She says she has been doing well since coming off of hemodialysis.  She reports compliance with her medications.  She has not been having any volume issues and denies chest pain, dyspnea, palpitations, PND, orthopnea, dizziness, syncope, edema, or early satiety.  She sometimes checks her blood pressures at home and notes systolics around [id number].  Pressure is higher today at 140/82.  Home Medications    Prior to Admission medications   Medication Sig Start Date End Date Taking? Authorizing Provider  B Complex-C (B-COMPLEX WITH  VITAMIN C) tablet Take 1 tablet by mouth daily.    [provider]  carvedilol (COREG) 6.25 MG tablet Take 1 tablet (6.25 mg total) by mouth 2 (two) times daily with a meal. 03/09/18   [place] Bing, DO  furosemide (LASIX) 80 MG tablet Take 80 mg by mouth daily.    [provider]  hydrOXYzine (ATARAX/VISTARIL) 25 MG tablet Take 1 tablet (25 mg total) by mouth  every 6 (six) hours. 07/06/19   Shirley, [place], DO  pantoprazole (PROTONIX) 40 MG tablet Take 1 tablet (40 mg total) by mouth 2 (two) times daily. 01/28/18 03/30/19  Alma Friendly, MD  promethazine (PHENERGAN) 25 MG tablet Take 1 tablet (25 mg total) by mouth every 6 (six) hours as needed for nausea. 07/14/18   Shelly Bombard, MD  sevelamer (RENAGEL) 800 MG tablet Take 800 mg by mouth 3 (three) times daily. 07/29/18   [provider]  spironolactone (ALDACTONE) 25 MG tablet Take 25 mg by mouth daily. 07/14/18   [provider]  valACYclovir (VALTREX) 1000 MG tablet Take 1 tablet twice a day for 3 days with each outbreak. 07/14/18   Shelly Bombard, MD    Review of Systems    Says that she is doing well since coming off of dialysis.  She denies chest pain, palpitations, dyspnea, pnd, orthopnea, n, v, dizziness, syncope, edema, weight gain, or early satiety.  All other systems reviewed and are otherwise negative except as noted above.  Physical Exam    VS:  BP 140/82 (BP Location: Right Arm, Patient Position: Sitting, Cuff Size: Normal)   Pulse 62   Ht 5\' 6"  (1.676 m)   Wt 168 lb 12 oz (76.5 kg)   BMI 27.24 kg/m  , BMI Body mass index is 27.24 kg/m. GEN: Well nourished, well developed, in no acute distress. HEENT: normal. Neck: Supple, no JVD, carotid bruits, or masses. Cardiac: RRR, 2/6 syst murmur @ LLSB  apex. No rubs, or gallops. No clubbing, cyanosis, edema.  Radials/DP/PT 2+ and equal bilaterally.  Respiratory:  Respirations regular and unlabored, clear to auscultation bilaterally. GI: Soft, nontender, nondistended, BS + x 4. MS: no deformity or atrophy. Skin: warm and dry, no rash. Neuro:  Strength and sensation are intact. Psych: Normal affect.  Accessory Clinical Findings    ECG personally reviewed by me today -regular sinus rhythm, 62, LVH with repolarization abnormalities- no acute changes.  Lab Results  Component Value Date   WBC 8.4  03/27/2019   HGB 15.3 (H) 03/30/2019   HCT 45.0 03/30/2019   MCV 96.5 03/27/2019   PLT 157 03/27/2019   Lab Results  Component Value Date   CREATININE 3.69 (H) 03/27/2019   BUN 46 (H) 03/27/2019   NA 137 03/30/2019   K 3.0 (L) 03/30/2019   CL 98 03/27/2019   CO2 25 03/27/2019   Lab Results  Component Value Date   ALT 6 08/22/2018   AST 16 08/22/2018   ALKPHOS 69 08/22/2018   BILITOT 0.5 08/22/2018    Lab Results  Component Value Date   HGBA1C 4.6 (L) 04/09/2017    Assessment & Plan    1.  Essential hypertension: Patient occasionally checks blood pressure at home and says systolics trend in the [id number].  Pressure is elevated today at 140/82.  I have asked her to check her blood pressure daily for the next 1 to 2 weeks and document findings.  I asked her to call us if pressures are trending  consistently greater than [id number] systolic, at which point I would consider adding low-dose amlodipine therapy.  2.  HFpEF/left ventricular hypertrophy: Volume status has been stable since coming off of hemodialysis late last year.  Blood pressure is mildly elevated today and she will track this more closely at home.  She remains on diuretic therapy including Lasix and spironolactone per nephrology.  3.  Abnormal ECG: This is reflective of LVH and repolarization abnormalities.  Normal LVEF by echo in August 2019 with nonischemic Myoview in July 2020.  4.  End-stage renal disease: Previously on dialysis.  She notes that she has been doing well since coming off of dialysis late last year.  She attributes this largely to compliance with medications and medical follow-up.  5.  Disposition: Follow-up in 6 months or sooner if necessary.  I have asked her to contact us within the next few weeks if pressures are trending greater than [id number] systolic.   Murray Hodgkins, NP 11/01/2019, 5:01 PM

## 2019-11-01 NOTE — Patient Instructions (Signed)
Medication Instructions:  Your physician recommends that you continue on your current medications as directed. Please refer to the Current Medication list given to you today.  *If you need a refill on your cardiac medications before your next appointment, please call your pharmacy*   Lab Work: None ordered  If you have labs (blood work) drawn today and your tests are completely normal, you will receive your results only by: . MyChart Message (if you have MyChart) OR . A paper copy in the mail If you have any lab test that is abnormal or we need to change your treatment, we will call you to review the results.   Testing/Procedures: None ordered   Follow-Up: At [facility], you and your health needs are our priority.  As part of our continuing mission to provide you with exceptional heart care, we have created designated Provider Care Teams.  These Care Teams include your primary Cardiologist (physician) and Advanced Practice Providers (APPs -  Physician Assistants and Nurse Practitioners) who all work together to provide you with the care you need, when you need it.  We recommend signing up for the patient portal called "MyChart".  Sign up information is provided on this After Visit Summary.  MyChart is used to connect with patients for Virtual Visits (Telemedicine).  Patients are able to view lab/test results, encounter notes, upcoming appointments, etc.  Non-urgent messages can be sent to your provider as well.   To learn more about what you can do with MyChart, go to [email or web address].    Your next appointment:   12 month(s)  The format for your next appointment:   In Person  Provider:    You may see Christopher End, MD or one of the following Advanced Practice Providers on your designated Care Team:    Christopher Berge, NP  Ryan Dunn, PA-C  Jacquelyn Visser, PA-C    Other Instructions N/A  

## 2019-11-03 ENCOUNTER — Other Ambulatory Visit: Payer: Self-pay

## 2019-11-03 ENCOUNTER — Ambulatory Visit (INDEPENDENT_AMBULATORY_CARE_PROVIDER_SITE_OTHER): Payer: Medicare Other | Admitting: Family Medicine

## 2019-11-03 ENCOUNTER — Encounter: Payer: Self-pay | Admitting: Family Medicine

## 2019-11-03 VITALS — BP 140/80 | HR 84 | Wt 170.0 lb

## 2019-11-03 DIAGNOSIS — Z720 Tobacco use: Secondary | ICD-10-CM | POA: Diagnosis not present

## 2019-11-03 DIAGNOSIS — F419 Anxiety disorder, unspecified: Secondary | ICD-10-CM | POA: Diagnosis not present

## 2019-11-03 DIAGNOSIS — R479 Unspecified speech disturbances: Secondary | ICD-10-CM

## 2019-11-03 DIAGNOSIS — S161XXA Strain of muscle, fascia and tendon at neck level, initial encounter: Secondary | ICD-10-CM | POA: Diagnosis not present

## 2019-11-03 MED ORDER — BUSPIRONE HCL 7.5 MG PO TABS: 7.5000 mg | ORAL_TABLET | Freq: Once | ORAL | 0 refills | Status: DC | PRN

## 2019-11-03 MED ORDER — LORAZEPAM 1 MG PO TABS: 1.0000 mg | ORAL_TABLET | Freq: Once | ORAL | 0 refills | Status: DC | PRN

## 2019-11-03 NOTE — Assessment & Plan Note (Signed)
Right sternocleidomastoid with tenderness to palpation and tightness.  Pain with movement of head to the left is also consistent with this strain.  I advised heating pads as well as over-the-counter muscle rub creams.  I advised that she try and stretch the area daily.  If continued pain can consider muscle relaxant.  Follow-up if no improvement.

## 2019-11-03 NOTE — Assessment & Plan Note (Signed)
Patient is no longer smoking cigarettes but states she is vaping.  I discussed this with patient and she states she hopes to quit completely.  I encouraged this.  I offered resources such as Dr. Valentina Lucks but patient states she would like to do this on her own.  I congratulated patient on the decision to quit smoking.

## 2019-11-03 NOTE — Patient Instructions (Addendum)
It was a pleasure seeing you today.   Today we discussed your flight anxiety, neck pain, difficulty with speech  For your flight anxiety: I have prescribed you Buspar to be taken 1hr before your flight.  This will make you drowsy so please make sure you have someone with you when you do not drive while taking this medication. I have added Ativan to your allergy list   For your neck pain: I think your muscle is spasming.  I would recommend heating pads and over-the-counter muscle rub cream.  Please try and stretch this area 3 times a day.  If continues to be worse please give me a call and we can try a muscle relaxant at that time.  For your difficulty with speech: I have referred you to both speech therapy and neurology.  They should call you to set up an appointment, if they do not call you please give [place] a call.  Please follow up in 1 month.  Difficulty with speech or sooner if symptoms persist or worsen.  These follow-up has been as you can with a virtual visit to discuss anxiety/depression. Please call the clinic immediately if you have any concerns.   Our clinic's number is [phone]. Please call with questions or concerns.   Please go to the emergency room if you have thoughts of hurting yourself or others, facial droop, confusion   Thank you,  Caroline More, DO

## 2019-11-03 NOTE — Assessment & Plan Note (Signed)
Patient with difficulty with speech and word finding.  This is been occurring since she was admitted in November 2019 at [facility] and was found to be somnolent with altered mental status.  Admission notes describe altered mental status is related to ESRD.  Improved with hemodialysis.  Patient is no longer on hemodialysis but notes difficulty with speech.  Given continued difficulty with speech I recommend that patient be evaluated by both speech therapy and neurology.  I placed these referrals and inform patient of this.  Follow-up if no improvement.  Sooner if worsening.  ED precautions given to patient.

## 2019-11-03 NOTE — Progress Notes (Signed)
SUBJECTIVE:   CHIEF COMPLAINT / HPI:   Flight anxiety  Patient presenting today for concern of flight anxiety.  States she has only taken one previous flight last October to [place].  During that flight she felt her heart racing, was very hot.  Felt scared the entire flight.  She was afraid of the height as well as being in the air in a plane.  Tomorrow morning she is flying to [place] and is very nervous to get on this flight.  Is asking if there is any medication she can take before she flies.  Right neck pain Patient reports new right-sided neck pain.  States that this occurred 3 days ago.  She woke up with this pain.  Denies any trauma or injury to the area.  It started around her throat and radiates to her right shoulder.  States it is very tender to the touch.  Pain is worse when she is laying on that side or stretching her head to the left.  Pain is intermittent.  It is throbbing in sensation.  Nothing makes it better but she is also not tried any medications or heat.  Difficulty with speech  Patient today concerned with difficulty with speech.  States that last year she was in a coma for a week.  She was hospitalized at [facility] for that admission.  Ever since she feels like her speech is not where it should be.  Has difficulty with word finding.  Also states some thoughts are all over the place.  She describes her speech as "like an elementary school child".  She reports she is no longer on hemodialysis for the past 4 months.  She did see speech therapy in the hospital but not as an outpatient.  Tobacco use  Patient reports that she is no longer smoking cigarettes but is vaping.  Is hoping to quit altogether.  Want to do this on her own.  PERTINENT  PMH / PSH: HFpEF, ESRD, lupus, tobacco abuse   OBJECTIVE:   BP 140/80   Pulse 84   Wt 170 lb (77.1 kg)   LMP 11/02/2019 (Exact Date)   SpO2 99%   BMI 27.44 kg/m   Gen: awake and alert, NAD HEENT: tenderness to  palpation of R sternocleidomastoid. SCM is firm to palpation  Cardio: RRR, no MRG Resp: CTAB, no wheezes, rales, or rhonchi  Ext: no edema   ASSESSMENT/PLAN:   Anxiety Patient with anxiety secondary to flying.  Patient has flown one previous time and had what appears to be a panic attack.  Patient will be flying with her best friend and her best friend's mother.  She will not be driving.  Given her anxiety related to the flight I think it is reasonable to premedicate prior to the flight to help with anxiety.  Initially Ativan was prescribed however patient did note she was given a medication that made her somnolent.  In reviewing [facility] records 1 mg Ativan was given to patient and patient became somnolent afterwards.  I have added this to her allergy list so that she will no longer get any benzodiazepines.  I spoke to the pharmacy and have them discontinue the order and discontinue the order in epic.  Other options include propanolol versus BuSpar.  Patient is already on a beta-blocker for her cardiac issues.  I have opted to give BuSpar 7.5 mg to be given 1 hour before flight.  Strict return precautions given.  Strain of sternocleidomastoid muscle Right sternocleidomastoid with tenderness to palpation and tightness.  Pain with movement of head to the left is also consistent with this strain.  I advised heating pads as well as over-the-counter muscle rub creams.  I advised that she try and stretch the area daily.  If continued pain can consider muscle relaxant.  Follow-up if no improvement.  Difficulty with speech Patient with difficulty with speech and word finding.  This is been occurring since she was admitted in November 2019 at [facility] and was found to be somnolent with altered mental status.  Admission notes describe altered mental status is related to ESRD.  Improved with hemodialysis.  Patient is no longer on hemodialysis but notes difficulty with speech.  Given continued difficulty  with speech I recommend that patient be evaluated by both speech therapy and neurology.  I placed these referrals and inform patient of this.  Follow-up if no improvement.  Sooner if worsening.  ED precautions given to patient.  Tobacco abuse Patient is no longer smoking cigarettes but states she is vaping.  I discussed this with patient and she states she hopes to quit completely.  I encouraged this.  I offered resources such as Dr. Valentina Lucks but patient states she would like to do this on her own.  I congratulated patient on the decision to quit smoking.     Caroline More, DO  PGY-3 [place]

## 2019-11-03 NOTE — Assessment & Plan Note (Signed)
Patient with anxiety secondary to flying.  Patient has flown one previous time and had what appears to be a panic attack.  Patient will be flying with her best friend and her best friend's mother.  She will not be driving.  Given her anxiety related to the flight I think it is reasonable to premedicate prior to the flight to help with anxiety.  Initially Ativan was prescribed however patient did note she was given a medication that made her somnolent.  In reviewing [facility] records 1 mg Ativan was given to patient and patient became somnolent afterwards.  I have added this to her allergy list so that she will no longer get any benzodiazepines.  I spoke to the pharmacy and have them discontinue the order and discontinue the order in epic.  Other options include propanolol versus BuSpar.  Patient is already on a beta-blocker for her cardiac issues.  I have opted to give BuSpar 7.5 mg to be given 1 hour before flight.  Strict return precautions given.

## 2019-12-18 ENCOUNTER — Ambulatory Visit: Payer: Medicare Other | Attending: Family Medicine | Admitting: Speech Pathology

## 2019-12-27 NOTE — Progress Notes (Deleted)
NEUROLOGY CONSULTATION NOTE  [name] MRN: [id number] DOB: April 15, 1996  Referring provider: Kinnie Feil, MD Primary care provider: Caroline More, DO  Reason for consult:  Speech difficulty  HISTORY OF PRESENT ILLNESS: Linda [name] is a 24 year old ***-handed black female with UCTD/SLE and thrombotic microangiopathy, ESRD and HTN who presents for speech disturbance.  History supplemented by *** PCP note.  She was admitted to [facility] from 11/15 to 07/27/2018 for altered mental status.  Since then, she reports word-finding difficulty.  ***.  She has not followed up with a speech therapist as an outpatient.  WBC was 23 and Cr 6.28.  CT and MRI of brain were negative for acute intracranial abnormality.  LP was negative for meningitis.  Long term EEG was negative for seizures.  She was daignosed with ESRD and altered mental status was attributed to encephalopathy from uremia.  Marland Kitchen    PAST MEDICAL HISTORY: Past Medical History:  Diagnosis Date  . Abnormal ECG    a. In setting of LVH w/ repolarization abnormalities; b. 03/2019 MV: No ischemia/infarct. EF 55-65%.  . Angioedema   . ESRD (end stage renal disease) ([place])    a. prev on HD.  Marland Kitchen GERD (gastroesophageal reflux disease)   . Hypertension   . Lupus ([place])   . LVH (left ventricular hypertrophy)    a. 11/2017 Echo: EF 50-55%. triv AI. Mild MR. Mod L and mild R atrial enlargement. Nl RV size/fxn. Small pericardial effusion w/o tampondae; b. 03/2018 Echo: EF 55-60%, restrictive filling pattern. Nl RV size/fxn. Sev LAE. Mild MR, mild to mod TR/PR. Mod PAH. Triv effusion.  . Migraines   . Mixed connective tissue disease ([place])   . Previous Dialysis patient [facility])    a. Off HD since late 2020.  Marland Kitchen Septic prepatellar bursitis of right knee 08/19/2017    PAST SURGICAL HISTORY: Past Surgical History:  Procedure Laterality Date  . GRAFT APPLICATION Left 01/15/6159   Procedure: BRACHIAL CEPHALIC GRAFT  CREATION;  Surgeon: Linda Huxley, MD;  Location: [facility] ORS;  Service: Vascular;  Laterality: Left;  . I & D EXTREMITY Right 08/06/2017   Procedure: IRRIGATION AND DEBRIDEMENT KNEE;  Surgeon: Linda Needles, MD;  Location: [place];  Service: Orthopedics;  Laterality: Right;  . MULTIPLE TOOTH EXTRACTIONS      MEDICATIONS: Current Outpatient Medications on File Prior to Visit  Medication Sig Dispense Refill  . B Complex-C (B-COMPLEX WITH VITAMIN C) tablet Take 1 tablet by mouth daily.    . busPIRone (BUSPAR) 7.5 MG tablet Take 1 tablet (7.5 mg total) by mouth once as needed for up to 1 dose (1 hr before flight). 2 tablet 0  . carvedilol (COREG) 6.25 MG tablet Take 1 tablet (6.25 mg total) by mouth 2 (two) times daily with a meal. 60 tablet 3  . furosemide (LASIX) 80 MG tablet Take 80 mg by mouth daily.    . hydrOXYzine (ATARAX/VISTARIL) 25 MG tablet Take 1 tablet (25 mg total) by mouth every 6 (six) hours. 12 tablet 0  . pantoprazole (PROTONIX) 40 MG tablet Take 1 tablet (40 mg total) by mouth 2 (two) times daily. 60 tablet 0  . promethazine (PHENERGAN) 25 MG tablet Take 1 tablet (25 mg total) by mouth every 6 (six) hours as needed for nausea. 30 tablet 0  . sevelamer (RENAGEL) 800 MG tablet Take 800 mg by mouth 3 (three) times daily.    Marland Kitchen spironolactone (ALDACTONE) 25 MG tablet Take  25 mg by mouth daily.  1  . valACYclovir (VALTREX) 1000 MG tablet Take 1 tablet twice a day for 3 days with each outbreak. 30 tablet 11   No current facility-administered medications on file prior to visit.    ALLERGIES: Allergies  Allergen Reactions  . Lisinopril Anaphylaxis    Angioedema  . Ativan [Lorazepam] Other (See Comments)    Somnolent with 1mg  ativan at [facility] Nov 2019  . Vicodin [Hydrocodone-Acetaminophen] Itching and Rash    FAMILY HISTORY: Family History  Problem Relation Age of Onset  . Lupus Mother   . Hypertension Mother   . Kidney disease Mother   . Heart Problems Mother   .  Coronary artery disease Mother 66       stent  . Diabetes Maternal Grandmother    ***.  SOCIAL HISTORY: Social History   Socioeconomic History  . Marital status: Single    Spouse name: Not on file  . Number of children: Not on file  . Years of education: Not on file  . Highest education level: Not on file  Occupational History  . Not on file  Tobacco Use  . Smoking status: Current Every Day Smoker    Packs/day: 0.25    Years: 5.00    Pack years: 1.25    Types: Cigarettes, Cigars    Start date: 02/11/2015  . Smokeless tobacco: Never Used  Substance and Sexual Activity  . Alcohol use: No    Alcohol/week: 0.0 standard drinks  . Drug use: No  . Sexual activity: Yes    Partners: Male    Birth control/protection: Implant  Other Topics Concern  . Not on file  Social History Narrative   Lives with boyfriend   Social Determinants of Health   Financial Resource Strain:   . Difficulty of Paying Living Expenses:   Food Insecurity:   . Worried About Charity fundraiser in the Last Year:   . Arboriculturist in the Last Year:   Transportation Needs:   . Film/video editor (Medical):   Marland Kitchen Lack of Transportation (Non-Medical):   Physical Activity:   . Days of Exercise per Week:   . Minutes of Exercise per Session:   Stress:   . Feeling of Stress :   Social Connections:   . Frequency of Communication with Friends and Family:   . Frequency of Social Gatherings with Friends and Family:   . Attends Religious Services:   . Active Member of Clubs or Organizations:   . Attends Archivist Meetings:   Marland Kitchen Marital Status:   Intimate Partner Violence:   . Fear of Current or Ex-Partner:   . Emotionally Abused:   Marland Kitchen Physically Abused:   . Sexually Abused:     REVIEW OF SYSTEMS: Constitutional: No fevers, chills, or sweats, no generalized fatigue, change in appetite Eyes: No visual changes, double vision, eye pain Ear, nose and throat: No hearing loss, ear pain, nasal  congestion, sore throat Cardiovascular: No chest pain, palpitations Respiratory:  No shortness of breath at rest or with exertion, wheezes GastrointestinaI: No nausea, vomiting, diarrhea, abdominal pain, fecal incontinence Genitourinary:  No dysuria, urinary retention or frequency Musculoskeletal:  No neck pain, back pain Integumentary: No rash, pruritus, skin lesions Neurological: as above Psychiatric: No depression, insomnia, anxiety Endocrine: No palpitations, fatigue, diaphoresis, mood swings, change in appetite, change in weight, increased thirst Hematologic/Lymphatic:  No purpura, petechiae. Allergic/Immunologic: no itchy/runny eyes, nasal congestion, recent allergic reactions, rashes  PHYSICAL  EXAM: *** General: No acute distress.  Patient appears ***-groomed.  *** Head:  Normocephalic/atraumatic Eyes:  fundi examined but not visualized Neck: supple, no paraspinal tenderness, full range of motion Back: No paraspinal tenderness Heart: regular rate and rhythm Lungs: Clear to auscultation bilaterally. Vascular: No carotid bruits. Neurological Exam: Mental status: alert and oriented to person, place, and time, recent and remote memory intact, fund of knowledge intact, attention and concentration intact, speech fluent and not dysarthric, language intact. Cranial nerves: CN I: not tested CN II: pupils equal, round and reactive to light, visual fields intact CN III, IV, VI:  full range of motion, no nystagmus, no ptosis CN V: facial sensation intact CN VII: upper and lower face symmetric CN VIII: hearing intact CN IX, X: gag intact, uvula midline CN XI: sternocleidomastoid and trapezius muscles intact CN XII: tongue midline Bulk & Tone: normal, no fasciculations. Motor:  5/5 throughout *** Sensation:  Pin[place]rick *** temperature *** and vibration sensation intact.  ***. Deep Tendon Reflexes:  2+ throughout, *** toes downgoing.  *** Finger to nose testing:  Without dysmetria.   *** Heel to shin:  Without dysmetria.  *** Gait:  Normal station and stride.  Able to turn and tandem walk. [place] ***.  IMPRESSION: ***  PLAN: ***  Thank you for allowing me to take part in the care of this patient.  Metta Clines, DO  CC: ***

## 2019-12-28 ENCOUNTER — Ambulatory Visit: Payer: Medicare Other | Admitting: Neurology

## 2020-01-15 DIAGNOSIS — E7401 von Gierke disease: Secondary | ICD-10-CM | POA: Insufficient documentation

## 2020-02-23 ENCOUNTER — Other Ambulatory Visit: Payer: Self-pay

## 2020-02-23 ENCOUNTER — Ambulatory Visit (INDEPENDENT_AMBULATORY_CARE_PROVIDER_SITE_OTHER): Payer: Medicare Other | Admitting: Obstetrics

## 2020-02-23 ENCOUNTER — Other Ambulatory Visit (HOSPITAL_COMMUNITY)
Admission: RE | Admit: 2020-02-23 | Discharge: 2020-02-23 | Disposition: A | Discharge status: Home / Self Care | Payer: Medicare Other | Source: Ambulatory Visit | Attending: Obstetrics | Admitting: Obstetrics

## 2020-02-23 ENCOUNTER — Encounter: Payer: Self-pay | Admitting: Obstetrics

## 2020-02-23 VITALS — BP 190/129 | HR 97 | Ht 66.0 in | Wt 174.6 lb

## 2020-02-23 DIAGNOSIS — Z01419 Encounter for gynecological examination (general) (routine) without abnormal findings: Secondary | ICD-10-CM | POA: Diagnosis present

## 2020-02-23 DIAGNOSIS — Z113 Encounter for screening for infections with a predominantly sexual mode of transmission: Secondary | ICD-10-CM | POA: Diagnosis present

## 2020-02-23 DIAGNOSIS — N898 Other specified noninflammatory disorders of vagina: Secondary | ICD-10-CM

## 2020-02-23 DIAGNOSIS — L0232 Furuncle of buttock: Secondary | ICD-10-CM | POA: Diagnosis not present

## 2020-02-23 DIAGNOSIS — Z0000 Encounter for general adult medical examination without abnormal findings: Secondary | ICD-10-CM | POA: Diagnosis not present

## 2020-02-23 DIAGNOSIS — I10 Essential (primary) hypertension: Secondary | ICD-10-CM

## 2020-02-23 MED ORDER — CLINDAMYCIN HCL 300 MG PO CAPS: 300.0000 mg | ORAL_CAPSULE | Freq: Three times a day (TID) | ORAL | 0 refills | Status: DC

## 2020-02-23 NOTE — Progress Notes (Signed)
Pt presents for annual c/o vaginal odor x 2 days. Pt requests all STD testing today.

## 2020-02-23 NOTE — Progress Notes (Signed)
Patient ID: Linda [name], female   DOB: 10/04/1995, 24 y.o.   MRN: [id number]  Chief Complaint  Patient presents with  . Gynecologic Exam    HPI Linda [name] is a 24 y.o. female.  Complains of vaginal discharge with odor.  Also c/o painful " bump " on buttock. HPI  Past Medical History:  Diagnosis Date  . Abnormal ECG    a. In setting of LVH w/ repolarization abnormalities; b. 03/2019 MV: No ischemia/infarct. EF 55-65%.  . Angioedema   . ESRD (end stage renal disease) ([place])    a. prev on HD.  Marland Kitchen GERD (gastroesophageal reflux disease)   . Hypertension   . Lupus ([place])   . LVH (left ventricular hypertrophy)    a. 11/2017 Echo: EF 50-55%. triv AI. Mild MR. Mod L and mild R atrial enlargement. Nl RV size/fxn. Small pericardial effusion w/o tampondae; b. 03/2018 Echo: EF 55-60%, restrictive filling pattern. Nl RV size/fxn. Sev LAE. Mild MR, mild to mod TR/PR. Mod PAH. Triv effusion.  . Migraines   . Mixed connective tissue disease ([place])   . Previous Dialysis patient [facility])    a. Off HD since late 2020.  Marland Kitchen Septic prepatellar bursitis of right knee 08/19/2017    Past Surgical History:  Procedure Laterality Date  . GRAFT APPLICATION Left 4/85/4627   Procedure: BRACHIAL CEPHALIC GRAFT CREATION;  Surgeon: Algernon Huxley, MD;  Location: [facility] ORS;  Service: Vascular;  Laterality: Left;  . I & D EXTREMITY Right 08/06/2017   Procedure: IRRIGATION AND DEBRIDEMENT KNEE;  Surgeon: Shona Needles, MD;  Location: [place];  Service: Orthopedics;  Laterality: Right;  . MULTIPLE TOOTH EXTRACTIONS      Family History  Problem Relation Age of Onset  . Lupus Mother   . Hypertension Mother   . Kidney disease Mother   . Heart Problems Mother   . Coronary artery disease Mother 48       stent  . Diabetes Maternal Grandmother     Social History Social History   Tobacco Use  . Smoking status: Current Every Day Smoker    Packs/day: 0.25    Years: 5.00    Pack years: 1.25    Types:  E-cigarettes    Start date: 02/11/2015  . Smokeless tobacco: Never Used  Vaping Use  . Vaping Use: Every day  Substance Use Topics  . Alcohol use: No    Alcohol/week: 0.0 standard drinks  . Drug use: No    Allergies  Allergen Reactions  . Lisinopril Anaphylaxis    Angioedema  . Ativan [Lorazepam] Other (See Comments)    Somnolent with 1mg  ativan at [facility] Nov 2019  . Vicodin [Hydrocodone-Acetaminophen] Itching and Rash    Current Outpatient Medications  Medication Sig Dispense Refill  . carvedilol (COREG) 6.25 MG tablet Take 1 tablet (6.25 mg total) by mouth 2 (two) times daily with a meal. 60 tablet 3  . furosemide (LASIX) 80 MG tablet Take 80 mg by mouth daily.    . B Complex-C (B-COMPLEX WITH VITAMIN C) tablet Take 1 tablet by mouth daily. (Patient not taking: Reported on 02/23/2020)    . busPIRone (BUSPAR) 7.5 MG tablet Take 1 tablet (7.5 mg total) by mouth once as needed for up to 1 dose (1 hr before flight). (Patient not taking: Reported on 02/23/2020) 2 tablet 0  . clindamycin (CLEOCIN) 300 MG capsule Take 1 capsule (300 mg total) by mouth 3 (three) times daily. 21 capsule 0  .  hydrOXYzine (ATARAX/VISTARIL) 25 MG tablet Take 1 tablet (25 mg total) by mouth every 6 (six) hours. (Patient not taking: Reported on 02/23/2020) 12 tablet 0  . pantoprazole (PROTONIX) 40 MG tablet Take 1 tablet (40 mg total) by mouth 2 (two) times daily. 60 tablet 0  . promethazine (PHENERGAN) 25 MG tablet Take 1 tablet (25 mg total) by mouth every 6 (six) hours as needed for nausea. (Patient not taking: Reported on 02/23/2020) 30 tablet 0  . sevelamer (RENAGEL) 800 MG tablet Take 800 mg by mouth 3 (three) times daily. (Patient not taking: Reported on 02/23/2020)    . spironolactone (ALDACTONE) 25 MG tablet Take 25 mg by mouth daily. (Patient not taking: Reported on 02/23/2020)  1  . valACYclovir (VALTREX) 1000 MG tablet Take 1 tablet twice a day for 3 days with each outbreak. (Patient not taking:  Reported on 02/23/2020) 30 tablet 11   No current facility-administered medications for this visit.    Review of Systems Review of Systems Constitutional: negative for fatigue and weight loss Respiratory: negative for cough and wheezing Cardiovascular: negative for chest pain, fatigue and palpitations Gastrointestinal: negative for abdominal pain and change in bowel habits Genitourinary:positive for vaginal discharge with odor and bump on buttock Integument/breast: negative for nipple discharge Musculoskeletal:negative for myalgias Neurological: negative for gait problems and tremors Behavioral/Psych: negative for abusive relationship, depression Endocrine: negative for temperature intolerance      Blood pressure (!) 190/129, pulse 97, height 5\' 6"  (1.676 m), weight 174 lb 9.6 oz (79.2 kg), last menstrual period 02/09/2020.  Physical Exam Physical Exam           General : Alert and no distress Abdomen:  normal findings: no organomegaly, soft, non-tender and no hernia  Pelvis:  External genitalia: normal general appearance Urinary system: urethral meatus normal and bladder without fullness, nontender Vaginal: normal without tenderness, induration or masses Cervix: normal appearance Adnexa: normal bimanual exam Uterus: anteverted and non-tender, normal size    50% of 15 min visit spent on counseling and coordination of care.   Data Reviewed Wet prep and cultures  Assessment       1. Encounter for routine gynecological examination with Papanicolaou smear of cervix Rx: - Cytology - PAP( [place])  2. Vaginal discharge Rx: - Cervicovaginal ancillary only( )  3. Screen for STD (sexually transmitted disease) Rx: - Hepatitis B surface antigen - Hepatitis C antibody - HIV Antibody (routine testing w rflx) - RPR  4. Boil of buttock Rx: - clindamycin (CLEOCIN) 300 MG capsule; Take 1 capsule (300 mg total) by mouth 3 (three) times daily.  Dispense: 21 capsule;  Refill: 0  5. HTN (hypertension), benign - managed by PCP  Plan  Follow up prn   Orders Placed This Encounter  Procedures  . Hepatitis B surface antigen  . Hepatitis C antibody  . HIV Antibody (routine testing w rflx)  . RPR   Meds ordered this encounter  Medications  . clindamycin (CLEOCIN) 300 MG capsule    Sig: Take 1 capsule (300 mg total) by mouth 3 (three) times daily.    Dispense:  21 capsule    Refill:  0     Shelly Bombard, MD 02/23/2020 1:39 PM

## 2020-02-24 LAB — HEPATITIS C ANTIBODY: Hep C Virus Ab: <0.1 s/co ratio (ref 0.0–0.9)

## 2020-02-24 LAB — RPR: RPR Ser Ql: NONREACTIVE

## 2020-02-24 LAB — HIV ANTIBODY (ROUTINE TESTING W REFLEX): HIV Screen 4th Generation wRfx: NONREACTIVE

## 2020-02-24 LAB — HEPATITIS B SURFACE ANTIGEN: Hepatitis B Surface Ag: NEGATIVE

## 2020-02-27 ENCOUNTER — Telehealth: Payer: Self-pay

## 2020-02-27 ENCOUNTER — Other Ambulatory Visit: Payer: Self-pay | Admitting: Obstetrics

## 2020-02-27 DIAGNOSIS — N760 Acute vaginitis: Secondary | ICD-10-CM

## 2020-02-27 DIAGNOSIS — B9689 Other specified bacterial agents as the cause of diseases classified elsewhere: Secondary | ICD-10-CM

## 2020-02-27 LAB — CYTOLOGY - PAP: Diagnosis: NEGATIVE

## 2020-02-27 LAB — CERVICOVAGINAL ANCILLARY ONLY
Bacterial Vaginitis (gardnerella): POSITIVE — AB
Candida Glabrata: NEGATIVE
Candida Vaginitis: NEGATIVE
Chlamydia: NEGATIVE
Comment: NEGATIVE
Comment: NEGATIVE
Comment: NEGATIVE
Comment: NEGATIVE
Comment: NEGATIVE
Comment: NORMAL
Neisseria Gonorrhea: NEGATIVE
Trichomonas: NEGATIVE

## 2020-02-27 MED ORDER — TINIDAZOLE 500 MG PO TABS: 1000.0000 mg | ORAL_TABLET | Freq: Every day | ORAL | 2 refills | Status: DC

## 2020-02-27 NOTE — Telephone Encounter (Signed)
Tried to call pt regarding results  Unable to make pt aware of results no vm setup.

## 2020-02-27 NOTE — Telephone Encounter (Signed)
-----   Message from Shelly Bombard, MD sent at 02/27/2020  4:10 PM EDT ----- Tindamax Rx for BV

## 2020-03-13 ENCOUNTER — Other Ambulatory Visit: Payer: Self-pay | Admitting: Pain Medicine

## 2020-03-13 DIAGNOSIS — G8929 Other chronic pain: Secondary | ICD-10-CM

## 2020-03-26 ENCOUNTER — Ambulatory Visit: Payer: Medicare Other | Attending: Speech Pathology | Admitting: Speech Pathology

## 2020-07-05 NOTE — Telephone Encounter (Signed)
Closing encounter

## 2020-11-05 ENCOUNTER — Ambulatory Visit: Payer: Medicare Other | Admitting: Family

## 2020-11-06 ENCOUNTER — Encounter: Payer: Self-pay | Admitting: Family

## 2020-11-25 ENCOUNTER — Telehealth (INDEPENDENT_AMBULATORY_CARE_PROVIDER_SITE_OTHER): Payer: Medicare Other | Admitting: Advanced Practice Midwife

## 2020-11-25 DIAGNOSIS — N750 Cyst of Bartholin's gland: Secondary | ICD-10-CM | POA: Diagnosis not present

## 2020-11-25 DIAGNOSIS — Z3009 Encounter for other general counseling and advice on contraception: Secondary | ICD-10-CM

## 2020-11-25 DIAGNOSIS — R102 Pelvic and perineal pain: Secondary | ICD-10-CM

## 2020-11-25 MED ORDER — LIDOCAINE 5 % EX OINT: 1.0000 "application " | TOPICAL_OINTMENT | CUTANEOUS | 2 refills | Status: DC | PRN

## 2020-11-25 MED ORDER — FLUCONAZOLE 150 MG PO TABS: 150.0000 mg | ORAL_TABLET | Freq: Once | ORAL | 1 refills | Status: AC

## 2020-11-25 NOTE — Progress Notes (Addendum)
GYNECOLOGY VIRTUAL VISIT ENCOUNTER NOTE  Provider location: [facility]   Patient location: Home  I connected with Linda [name] on 11/25/20 at  2:20 PM EDT by MyChart Video Encounter and verified that I am speaking with the correct person using two identifiers.   I discussed the limitations, risks, security and privacy concerns of performing an evaluation and management service virtually and the availability of in person appointments. I also discussed with the patient that there may be a patient responsible charge related to this service. The patient expressed understanding and agreed to proceed.   History:  Linda [name] is a 25 y.o. [place] female being evaluated today for vaginal pain due to Bartholin's Gland cyst. She had evaluation at Urgent Care in [facility] today and per notes it did not need I&D at this time.  Rx for antibiotics and NSAIDs given. Pt desires something stronger for pain.  She reports she had her Nexplanon removed because she was receiving dialysis and it was in the way of a central line IV access. She is prescribed OCPs but is not taking them. She does not desire pregnancy right now.  She denies any abnormal vaginal discharge, bleeding, pelvic pain or other concerns.       Past Medical History:  Diagnosis Date  . Abnormal ECG    a. In setting of LVH w/ repolarization abnormalities; b. 03/2019 MV: No ischemia/infarct. EF 55-65%.  . Angioedema   . ESRD (end stage renal disease) ([place])    a. prev on HD.  Marland Kitchen GERD (gastroesophageal reflux disease)   . Hypertension   . Lupus ([place])   . LVH (left ventricular hypertrophy)    a. 11/2017 Echo: EF 50-55%. triv AI. Mild MR. Mod L and mild R atrial enlargement. Nl RV size/fxn. Small pericardial effusion w/o tampondae; b. 03/2018 Echo: EF 55-60%, restrictive filling pattern. Nl RV size/fxn. Sev LAE. Mild MR, mild to mod TR/PR. Mod PAH. Triv effusion.  . Migraines   . Mixed connective  tissue disease ([place])   . Previous Dialysis patient [facility])    a. Off HD since late 2020.  Marland Kitchen Septic prepatellar bursitis of right knee 08/19/2017   Past Surgical History:  Procedure Laterality Date  . GRAFT APPLICATION Left 0000000   Procedure: BRACHIAL CEPHALIC GRAFT CREATION;  Surgeon: Algernon Huxley, MD;  Location: [facility] ORS;  Service: Vascular;  Laterality: Left;  . I & D EXTREMITY Right 08/06/2017   Procedure: IRRIGATION AND DEBRIDEMENT KNEE;  Surgeon: Shona Needles, MD;  Location: [place];  Service: Orthopedics;  Laterality: Right;  . MULTIPLE TOOTH EXTRACTIONS     The following portions of the patient's history were reviewed and updated as appropriate: allergies, current medications, past family history, past medical history, past social history, past surgical history and problem list.   Health Maintenance:  Normal pap on 02/23/20.   Review of Systems:  Pertinent items noted in HPI and remainder of comprehensive ROS otherwise negative.  Physical Exam:   General:  Alert, oriented and cooperative. Patient appears to be in no acute distress.  Mental Status: Normal mood and affect. Normal behavior. Normal judgment and thought content.   Respiratory: Normal respiratory effort, no problems with respiration noted  Rest of physical exam deferred due to type of encounter  Labs and Imaging No results found for this or any previous visit (from the past 336 hour(s)). No results found.     Assessment and Plan:  1. Bartholin gland cyst --Per pt and as documented by Urgent Care in [place]. Unable to I&D today --Pt received Rx for Clindamycin and Naproxen and was told to do warm compresses. --Diflucan Rx sent since pt on abx to treat yeast infection, encouraged pt to take probiotic daily while on abx. - lidocaine (XYLOCAINE) 5 % ointment; Apply 1 application topically as needed.  Dispense: 35.44 g; Refill: 2  2. Vaginal pain --Pt desires stronger pain medication for vaginal pain.   Given review of PMDD and pt with pain contract for oxycodone with another provider, I do not recommend additional narcotic medication. --Pt has Rx for Neurotin but her dialysis doctor told her to stop taking based on labs recently.  Pt to f/u with dialysis doctor to see if she can resume Neurotin. --Topical treatment plus continuing warm compresses, abx, and NSAIDs  --Pt to call office if symptoms do not improve in 48 hours and we can get her in for an office visit - lidocaine (XYLOCAINE) 5 % ointment; Apply 1 application topically as needed.  Dispense: 35.44 g; Refill: 2    3. Encounter for general counseling and advice on contraceptive management --Pt desires LARC again and does not desire pregnancy. She liked Nexplanon and would like it again.  She has PCP visit this Thursday so will discuss replacing Nexplanon there. --Let our office know and we can schedule Nexplanon insertion as needed.   I discussed the assessment and treatment plan with the patient. The patient was provided an opportunity to ask questions and all were answered. The patient agreed with the plan and demonstrated an understanding of the instructions.   The patient was advised to call back or seek an in-person evaluation/go to the ED if the symptoms worsen or if the condition fails to improve as anticipated.  I provided 10 minutes of face-to-face time during this encounter.   Fatima Blank, [place] for [employer], [place]

## 2020-11-25 NOTE — Addendum Note (Signed)
Addended by: Fatima Blank A on: 11/25/2020 04:42 PM   Modules accepted: Orders

## 2020-12-11 ENCOUNTER — Telehealth: Payer: Self-pay | Admitting: Internal Medicine

## 2020-12-11 NOTE — Telephone Encounter (Signed)
Patient has been contacted at least 3 times for a recall, recall has been removed

## 2021-04-08 ENCOUNTER — Ambulatory Visit (INDEPENDENT_AMBULATORY_CARE_PROVIDER_SITE_OTHER): Payer: Medicare Other | Admitting: Obstetrics

## 2021-04-08 ENCOUNTER — Other Ambulatory Visit (HOSPITAL_COMMUNITY)
Admission: RE | Admit: 2021-04-08 | Discharge: 2021-04-08 | Disposition: A | Discharge status: Home / Self Care | Payer: Medicare Other | Source: Ambulatory Visit | Attending: Obstetrics | Admitting: Obstetrics

## 2021-04-08 ENCOUNTER — Encounter: Payer: Self-pay | Admitting: Obstetrics

## 2021-04-08 ENCOUNTER — Other Ambulatory Visit: Payer: Self-pay

## 2021-04-08 VITALS — BP 136/94 | HR 62 | Ht 66.0 in | Wt 178.4 lb

## 2021-04-08 DIAGNOSIS — L0232 Furuncle of buttock: Secondary | ICD-10-CM | POA: Diagnosis not present

## 2021-04-08 DIAGNOSIS — Z01419 Encounter for gynecological examination (general) (routine) without abnormal findings: Secondary | ICD-10-CM | POA: Diagnosis present

## 2021-04-08 DIAGNOSIS — N898 Other specified noninflammatory disorders of vagina: Secondary | ICD-10-CM | POA: Diagnosis present

## 2021-04-08 DIAGNOSIS — L739 Follicular disorder, unspecified: Secondary | ICD-10-CM | POA: Diagnosis not present

## 2021-04-08 DIAGNOSIS — Z3009 Encounter for other general counseling and advice on contraception: Secondary | ICD-10-CM

## 2021-04-08 DIAGNOSIS — Z30013 Encounter for initial prescription of injectable contraceptive: Secondary | ICD-10-CM | POA: Diagnosis not present

## 2021-04-08 LAB — POCT URINE PREGNANCY: Preg Test, Ur: NEGATIVE

## 2021-04-08 MED ORDER — MEDROXYPROGESTERONE ACETATE 150 MG/ML IM SUSP
150.0000 mg | Freq: Once | INTRAMUSCULAR | Status: AC
  Administered 2021-04-08: 150 mg via INTRAMUSCULAR

## 2021-04-08 MED ORDER — MEDROXYPROGESTERONE ACETATE 150 MG/ML IM SUSP: 150.0000 mg | INTRAMUSCULAR | 4 refills | Status: DC

## 2021-04-08 MED ORDER — AMOXICILLIN-POT CLAVULANATE 875-125 MG PO TABS: 1.0000 | ORAL_TABLET | Freq: Two times a day (BID) | ORAL | 2 refills | Status: DC

## 2021-04-08 NOTE — Progress Notes (Signed)
Subjective:        Linda [name] is a 25 y.o. female here for a routine exam.  Current complaints: Vaginal discharge.  Also c/o bumps in the crack of buttocks and chronic pubic shaving bumps..    Personal health questionnaire:  Is patient Ashkenazi Jewish, have a family history of breast and/or ovarian cancer: no Is there a family history of uterine cancer diagnosed at age < 33, gastrointestinal cancer, urinary tract cancer, family member who is a Field seismologist syndrome-associated carrier: no Is the patient overweight and hypertensive, family history of diabetes, personal history of gestational diabetes, preeclampsia or PCOS: no Is patient over 69, have PCOS,  family history of premature CHD under age 54, diabetes, smoke, have hypertension or peripheral artery disease:  no At any time, has a partner hit, kicked or otherwise hurt or frightened you?: no Over the past 2 weeks, have you felt down, depressed or hopeless?: no Over the past 2 weeks, have you felt little interest or pleasure in doing things?:no   Gynecologic History Patient's last menstrual period was 03/19/2021 (approximate). Contraception: None Last Pap: 02-23-2020. Results were: normal Last mammogram: n/a. Results were: n/a  Obstetric History OB History  Gravida Para Term Preterm AB Living  0 0 0 0 0 0  SAB IAB Ectopic Multiple Live Births  0 0 0 0      Past Medical History:  Diagnosis Date   Abnormal ECG    a. In setting of LVH w/ repolarization abnormalities; b. 03/2019 MV: No ischemia/infarct. EF 55-65%.   Angioedema    ESRD (end stage renal disease) ([place])    a. prev on HD.   GERD (gastroesophageal reflux disease)    Hypertension    Lupus ([facility])    LVH (left ventricular hypertrophy)    a. 11/2017 Echo: EF 50-55%. triv AI. Mild MR. Mod L and mild R atrial enlargement. Nl RV size/fxn. Small pericardial effusion w/o tampondae; b. 03/2018 Echo: EF 55-60%, restrictive filling pattern. Nl RV size/fxn. Sev LAE. Mild MR,  mild to mod TR/PR. Mod PAH. Triv effusion.   Migraines    Mixed connective tissue disease ([place])    Previous Dialysis patient [facility])    a. Off HD since late 2020.   Septic prepatellar bursitis of right knee 08/19/2017    Past Surgical History:  Procedure Laterality Date   GRAFT APPLICATION Left 0000000   Procedure: BRACHIAL CEPHALIC GRAFT CREATION;  Surgeon: Algernon Huxley, MD;  Location: [facility] ORS;  Service: Vascular;  Laterality: Left;   I & D EXTREMITY Right 08/06/2017   Procedure: IRRIGATION AND DEBRIDEMENT KNEE;  Surgeon: Shona Needles, MD;  Location: [place];  Service: Orthopedics;  Laterality: Right;   MULTIPLE TOOTH EXTRACTIONS       Current Outpatient Medications:    amoxicillin-clavulanate (AUGMENTIN) 875-125 MG tablet, Take 1 tablet by mouth 2 (two) times daily. 1 tablet po BID x10 days, Disp: 14 tablet, Rfl: 2   B Complex-C (B-COMPLEX WITH VITAMIN C) tablet, Take 1 tablet by mouth daily., Disp: , Rfl:    carvedilol (COREG) 6.25 MG tablet, Take 1 tablet (6.25 mg total) by mouth 2 (two) times daily with a meal., Disp: 60 tablet, Rfl: 3   furosemide (LASIX) 80 MG tablet, Take 80 mg by mouth daily., Disp: , Rfl:    medroxyPROGESTERone (DEPO-PROVERA) 150 MG/ML injection, Inject 1 mL (150 mg total) into the muscle every 3 (three) months., Disp: 1 mL, Rfl: 4   pantoprazole (PROTONIX) 40 MG tablet,  Take 1 tablet (40 mg total) by mouth 2 (two) times daily., Disp: 60 tablet, Rfl: 0   promethazine (PHENERGAN) 25 MG tablet, Take 1 tablet (25 mg total) by mouth every 6 (six) hours as needed for nausea., Disp: 30 tablet, Rfl: 0   busPIRone (BUSPAR) 7.5 MG tablet, Take 1 tablet (7.5 mg total) by mouth once as needed for up to 1 dose (1 hr before flight). (Patient not taking: No sig reported), Disp: 2 tablet, Rfl: 0   hydrOXYzine (ATARAX/VISTARIL) 25 MG tablet, Take 1 tablet (25 mg total) by mouth every 6 (six) hours. (Patient not taking: No sig reported), Disp: 12 tablet, Rfl: 0   lidocaine  (XYLOCAINE) 5 % ointment, Apply 1 application topically as needed. (Patient not taking: Reported on 04/08/2021), Disp: 35.44 g, Rfl: 2   sevelamer (RENAGEL) 800 MG tablet, Take 800 mg by mouth 3 (three) times daily. (Patient not taking: No sig reported), Disp: , Rfl:    spironolactone (ALDACTONE) 25 MG tablet, Take 25 mg by mouth daily. (Patient not taking: No sig reported), Disp: , Rfl: 1   valACYclovir (VALTREX) 1000 MG tablet, Take 1 tablet twice a day for 3 days with each outbreak. (Patient not taking: No sig reported), Disp: 30 tablet, Rfl: 11 Allergies  Allergen Reactions   Lisinopril Anaphylaxis    Angioedema   Ativan [Lorazepam] Other (See Comments)    Somnolent with '1mg'$  ativan at [facility] Nov 2019   Vicodin [Hydrocodone-Acetaminophen] Itching and Rash    Social History   Tobacco Use   Smoking status: Every Day    Packs/day: 0.25    Years: 5.00    Pack years: 1.25    Types: E-cigarettes, Cigarettes    Start date: 02/11/2015   Smokeless tobacco: Never  Substance Use Topics   Alcohol use: No    Alcohol/week: 0.0 standard drinks    Family History  Problem Relation Age of Onset   Lupus Mother    Hypertension Mother    Kidney disease Mother    Heart Problems Mother    Coronary artery disease Mother 1       stent   Diabetes Maternal Grandmother       Review of Systems  Constitutional: negative for fatigue and weight loss Respiratory: negative for cough and wheezing Cardiovascular: negative for chest pain, fatigue and palpitations Gastrointestinal: negative for abdominal pain and change in bowel habits Musculoskeletal:negative for myalgias Neurological: negative for gait problems and tremors Behavioral/Psych: negative for abusive relationship, depression Endocrine: negative for temperature intolerance    Genitourinary:negative for abnormal menstrual periods, genital lesions, hot flashes, sexual problems and vaginal discharge Integument/breast: negative for breast  lump, breast tenderness, nipple discharge and skin lesion(s)    Objective:       BP (!) 136/94   Pulse 62   Ht '5\' 6"'$  (1.676 m)   Wt 178 lb 6.4 oz (80.9 kg)   LMP 03/19/2021 (Approximate)   BMI 28.79 kg/m  General:   Alert and no distress  Skin:   no rash or abnormalities  Lungs:   clear to auscultation bilaterally  Heart:   regular rate and rhythm, S1, S2 normal, no murmur, click, rub or gallop  Breasts:   normal without suspicious masses, skin or nipple changes or axillary nodes  Abdomen:  normal findings: no organomegaly, soft, non-tender and no hernia  Pelvis:  External genitalia: normal general appearance Urinary system: urethral meatus normal and bladder without fullness, nontender Vaginal: normal without tenderness, induration or masses Cervix:  normal appearance Adnexa: normal bimanual exam Uterus: anteverted and non-tender, normal size   Lab Review Urine pregnancy test Labs reviewed yes Radiologic studies reviewed no  I have spent a total of 20 minutes of face-to-face time, excluding clinical staff time, reviewing notes and preparing to see patient, ordering tests and/or medications, and counseling the patient.   Assessment:    1. Encounter for routine gynecological examination with Papanicolaou smear of cervix Rx: - Cytology - PAP( [place]) - POCT urine pregnancy  2. Vaginal discharge Rx: - Cervicovaginal ancillary only( [place])  3. Chronic folliculitis - Augmentin Rx  4. Boil of buttock Rx: - amoxicillin-clavulanate (AUGMENTIN) 875-125 MG tablet; Take 1 tablet by mouth 2 (two) times daily. 1 tablet po BID x10 days  Dispense: 14 tablet; Refill: 2  5. Encounter for other general counseling and advice on contraception - wants Depo  6. Encounter for initial prescription of injectable contraceptive Rx: - medroxyPROGESTERone (DEPO-PROVERA) injection 150 mg - medroxyPROGESTERone (DEPO-PROVERA) 150 MG/ML injection; Inject 1 mL (150 mg total) into the  muscle every 3 (three) months.  Dispense: 1 mL; Refill: 4     Plan:    Education reviewed: calcium supplements, depression evaluation, low fat, low cholesterol diet, safe sex/STD prevention, self breast exams, smoking cessation, and weight bearing exercise. Contraception: Depo-Provera injections. Follow up in: 3 months.   Meds ordered this encounter  Medications   medroxyPROGESTERone (DEPO-PROVERA) injection 150 mg   amoxicillin-clavulanate (AUGMENTIN) 875-125 MG tablet    Sig: Take 1 tablet by mouth 2 (two) times daily. 1 tablet po BID x10 days    Dispense:  14 tablet    Refill:  2   medroxyPROGESTERone (DEPO-PROVERA) 150 MG/ML injection    Sig: Inject 1 mL (150 mg total) into the muscle every 3 (three) months.    Dispense:  1 mL    Refill:  4   Orders Placed This Encounter  Procedures   POCT urine pregnancy    Assciate with Z32.02 (negative pregnancy test). If positive, switch to Z32.01 (positive pregnancy test)     Shelly Bombard, MD 04/08/2021 5:04 PM

## 2021-04-08 NOTE — Progress Notes (Signed)
Here for STI testing.  Also reports "bump" on suprapubic area that drains pus occasionally.

## 2021-04-09 LAB — CERVICOVAGINAL ANCILLARY ONLY
Bacterial Vaginitis (gardnerella): POSITIVE — AB
Candida Glabrata: NEGATIVE
Candida Vaginitis: NEGATIVE
Chlamydia: NEGATIVE
Comment: NEGATIVE
Comment: NEGATIVE
Comment: NEGATIVE
Comment: NEGATIVE
Comment: NEGATIVE
Comment: NORMAL
Neisseria Gonorrhea: NEGATIVE
Trichomonas: NEGATIVE

## 2021-04-10 ENCOUNTER — Other Ambulatory Visit: Payer: Self-pay | Admitting: Obstetrics

## 2021-04-10 DIAGNOSIS — N760 Acute vaginitis: Secondary | ICD-10-CM

## 2021-04-10 DIAGNOSIS — B9689 Other specified bacterial agents as the cause of diseases classified elsewhere: Secondary | ICD-10-CM

## 2021-04-10 MED ORDER — METRONIDAZOLE 500 MG PO TABS: 500.0000 mg | ORAL_TABLET | Freq: Two times a day (BID) | ORAL | 2 refills | Status: DC

## 2021-04-11 LAB — CYTOLOGY - PAP: Diagnosis: NEGATIVE

## 2021-05-28 DIAGNOSIS — B789 Strongyloidiasis, unspecified: Secondary | ICD-10-CM | POA: Insufficient documentation

## 2021-06-30 ENCOUNTER — Ambulatory Visit (INDEPENDENT_AMBULATORY_CARE_PROVIDER_SITE_OTHER): Payer: Medicare Other

## 2021-06-30 ENCOUNTER — Other Ambulatory Visit: Payer: Self-pay

## 2021-06-30 DIAGNOSIS — Z3009 Encounter for other general counseling and advice on contraception: Secondary | ICD-10-CM

## 2021-06-30 NOTE — Progress Notes (Signed)
Patient presents for depo injection. Patient states that she does not want to continue the depo at this time. She states that she has been bleeding for the past 3 weeks. Patient states that she is going to use condoms for birth control. She has no other concerns. Offered contraception education with the provider, and she declined at this time.

## 2021-07-01 ENCOUNTER — Ambulatory Visit (INDEPENDENT_AMBULATORY_CARE_PROVIDER_SITE_OTHER): Payer: Medicare Other | Admitting: *Deleted

## 2021-07-01 ENCOUNTER — Other Ambulatory Visit (HOSPITAL_COMMUNITY)
Admission: RE | Admit: 2021-07-01 | Discharge: 2021-07-01 | Disposition: A | Discharge status: Home / Self Care | Payer: Medicare Other | Source: Ambulatory Visit | Attending: Obstetrics and Gynecology | Admitting: Obstetrics and Gynecology

## 2021-07-01 DIAGNOSIS — Z113 Encounter for screening for infections with a predominantly sexual mode of transmission: Secondary | ICD-10-CM

## 2021-07-01 NOTE — Progress Notes (Signed)
SUBJECTIVE:  25 y.o. female here for STD testing Denies significant pelvic pain or fever. Pt states some irregular bleeding with Depo use.  Pt also states new sexual partner.  No UTI symptoms.   No LMP recorded.  OBJECTIVE:  She appears well, afebrile. Urine dipstick: not done.  ASSESSMENT:  STD screen Pt request full panel self swab  PLAN:  GC, chlamydia, trichomonas, BVAG, CVAG probe sent to lab. Treatment: To be determined once lab results are received ROV prn if symptoms persist or worsen.

## 2021-07-01 NOTE — Progress Notes (Signed)
Agree with nurses's documentation of this patient's clinic encounter.  Finas Delone L, MD  

## 2021-07-01 NOTE — Progress Notes (Signed)
Patient was assessed and managed by nursing staff during this encounter. I have reviewed the chart and agree with the documentation and plan. I have also made any necessary editorial changes.  Mora Bellman, MD 07/01/2021 8:25 AM

## 2021-07-02 ENCOUNTER — Other Ambulatory Visit: Payer: Self-pay

## 2021-07-02 ENCOUNTER — Telehealth: Payer: Self-pay

## 2021-07-02 DIAGNOSIS — B9689 Other specified bacterial agents as the cause of diseases classified elsewhere: Secondary | ICD-10-CM

## 2021-07-02 LAB — CERVICOVAGINAL ANCILLARY ONLY
Bacterial Vaginitis (gardnerella): POSITIVE — AB
Candida Glabrata: NEGATIVE
Candida Vaginitis: NEGATIVE
Chlamydia: NEGATIVE
Comment: NEGATIVE
Comment: NEGATIVE
Comment: NEGATIVE
Comment: NEGATIVE
Comment: NEGATIVE
Comment: NORMAL
Neisseria Gonorrhea: NEGATIVE
Trichomonas: NEGATIVE

## 2021-07-02 MED ORDER — METRONIDAZOLE 500 MG PO TABS: 500.0000 mg | ORAL_TABLET | Freq: Two times a day (BID) | ORAL | 0 refills | Status: AC

## 2021-07-02 NOTE — Telephone Encounter (Signed)
Returned phone call per pt request. Rx sent per protocol and based on lab results.

## 2021-07-31 ENCOUNTER — Ambulatory Visit: Payer: Medicare Other

## 2021-09-02 ENCOUNTER — Other Ambulatory Visit: Payer: Self-pay

## 2021-09-02 ENCOUNTER — Encounter (HOSPITAL_COMMUNITY): Payer: Self-pay

## 2021-09-02 ENCOUNTER — Emergency Department (HOSPITAL_COMMUNITY): Payer: Medicare Other

## 2021-09-02 ENCOUNTER — Inpatient Hospital Stay (HOSPITAL_COMMUNITY)
Admission: EM | Admit: 2021-09-02 | Discharge: 2021-09-05 | DRG: 640 | Disposition: A | Discharge status: Home / Self Care | Payer: Medicare Other | Attending: Internal Medicine | Admitting: Internal Medicine

## 2021-09-02 DIAGNOSIS — N186 End stage renal disease: Secondary | ICD-10-CM | POA: Diagnosis present

## 2021-09-02 DIAGNOSIS — R7989 Other specified abnormal findings of blood chemistry: Secondary | ICD-10-CM | POA: Diagnosis present

## 2021-09-02 DIAGNOSIS — M3214 Glomerular disease in systemic lupus erythematosus: Secondary | ICD-10-CM | POA: Diagnosis present

## 2021-09-02 DIAGNOSIS — I120 Hypertensive chronic kidney disease with stage 5 chronic kidney disease or end stage renal disease: Secondary | ICD-10-CM | POA: Diagnosis present

## 2021-09-02 DIAGNOSIS — E872 Acidosis, unspecified: Secondary | ICD-10-CM | POA: Diagnosis present

## 2021-09-02 DIAGNOSIS — R778 Other specified abnormalities of plasma proteins: Secondary | ICD-10-CM | POA: Diagnosis present

## 2021-09-02 DIAGNOSIS — J810 Acute pulmonary edema: Secondary | ICD-10-CM | POA: Diagnosis present

## 2021-09-02 DIAGNOSIS — Z79899 Other long term (current) drug therapy: Secondary | ICD-10-CM

## 2021-09-02 DIAGNOSIS — N2581 Secondary hyperparathyroidism of renal origin: Secondary | ICD-10-CM | POA: Diagnosis present

## 2021-09-02 DIAGNOSIS — Z833 Family history of diabetes mellitus: Secondary | ICD-10-CM | POA: Diagnosis not present

## 2021-09-02 DIAGNOSIS — F17290 Nicotine dependence, other tobacco product, uncomplicated: Secondary | ICD-10-CM | POA: Diagnosis present

## 2021-09-02 DIAGNOSIS — D631 Anemia in chronic kidney disease: Secondary | ICD-10-CM | POA: Diagnosis present

## 2021-09-02 DIAGNOSIS — J9601 Acute respiratory failure with hypoxia: Secondary | ICD-10-CM | POA: Diagnosis present

## 2021-09-02 DIAGNOSIS — I10 Essential (primary) hypertension: Secondary | ICD-10-CM | POA: Diagnosis present

## 2021-09-02 DIAGNOSIS — N185 Chronic kidney disease, stage 5: Secondary | ICD-10-CM | POA: Diagnosis not present

## 2021-09-02 DIAGNOSIS — L989 Disorder of the skin and subcutaneous tissue, unspecified: Secondary | ICD-10-CM | POA: Diagnosis present

## 2021-09-02 DIAGNOSIS — N289 Disorder of kidney and ureter, unspecified: Secondary | ICD-10-CM

## 2021-09-02 DIAGNOSIS — D539 Nutritional anemia, unspecified: Secondary | ICD-10-CM | POA: Diagnosis present

## 2021-09-02 DIAGNOSIS — E8770 Fluid overload, unspecified: Principal | ICD-10-CM | POA: Diagnosis present

## 2021-09-02 DIAGNOSIS — R0602 Shortness of breath: Secondary | ICD-10-CM | POA: Diagnosis present

## 2021-09-02 DIAGNOSIS — K219 Gastro-esophageal reflux disease without esophagitis: Secondary | ICD-10-CM | POA: Diagnosis present

## 2021-09-02 DIAGNOSIS — Z8249 Family history of ischemic heart disease and other diseases of the circulatory system: Secondary | ICD-10-CM | POA: Diagnosis not present

## 2021-09-02 DIAGNOSIS — Z20822 Contact with and (suspected) exposure to covid-19: Secondary | ICD-10-CM | POA: Diagnosis present

## 2021-09-02 DIAGNOSIS — D75A Glucose-6-phosphate dehydrogenase (G6PD) deficiency without anemia: Secondary | ICD-10-CM | POA: Diagnosis present

## 2021-09-02 DIAGNOSIS — E8729 Other acidosis: Secondary | ICD-10-CM | POA: Diagnosis present

## 2021-09-02 DIAGNOSIS — Z7951 Long term (current) use of inhaled steroids: Secondary | ICD-10-CM

## 2021-09-02 HISTORY — DX: Other acidosis: E87.29

## 2021-09-02 LAB — CBC
HCT: 34.1 % — ABNORMAL LOW (ref 36.0–46.0)
Hemoglobin: 10.4 g/dL — ABNORMAL LOW (ref 12.0–15.0)
MCH: 31.4 pg (ref 26.0–34.0)
MCHC: 30.5 g/dL (ref 30.0–36.0)
MCV: 103 fL — ABNORMAL HIGH (ref 80.0–100.0)
Platelets: 150 10*3/uL (ref 150–400)
RBC: 3.31 MIL/uL — ABNORMAL LOW (ref 3.87–5.11)
RDW: 14.3 % (ref 11.5–15.5)
WBC: 8.9 10*3/uL (ref 4.0–10.5)
nRBC: 0 % (ref 0.0–0.2)

## 2021-09-02 LAB — TROPONIN I (HIGH SENSITIVITY)
Troponin I (High Sensitivity): 52 ng/L — ABNORMAL HIGH (ref <18)
Troponin I (High Sensitivity): 50 ng/L — ABNORMAL HIGH (ref <18)

## 2021-09-02 LAB — BASIC METABOLIC PANEL
Anion gap: 13 (ref 5–15)
BUN: 54 mg/dL — ABNORMAL HIGH (ref 6–20)
CO2: 14 mmol/L — ABNORMAL LOW (ref 22–32)
Calcium: 8.6 mg/dL — ABNORMAL LOW (ref 8.9–10.3)
Chloride: 108 mmol/L (ref 98–111)
Creatinine, Ser: 5.67 mg/dL — ABNORMAL HIGH (ref 0.44–1.00)
GFR, Estimated: 10 mL/min — ABNORMAL LOW (ref >60)
Glucose, Bld: 72 mg/dL (ref 70–99)
Potassium: 4.4 mmol/L (ref 3.5–5.1)
Sodium: 135 mmol/L (ref 135–145)

## 2021-09-02 LAB — I-STAT BETA HCG BLOOD, ED (MC, WL, AP ONLY): I-stat hCG, quantitative: <5 m[IU]/mL (ref <5)

## 2021-09-02 MED ORDER — AMLODIPINE BESYLATE 5 MG PO TABS
10.0000 mg | ORAL_TABLET | Freq: Every day | ORAL | Status: DC
  Filled 2021-09-02: qty 2

## 2021-09-02 MED ORDER — ACETAMINOPHEN 325 MG PO TABS: 650.0000 mg | ORAL_TABLET | Freq: Four times a day (QID) | ORAL | Status: DC | PRN

## 2021-09-02 MED ORDER — CARVEDILOL 3.125 MG PO TABS
6.2500 mg | ORAL_TABLET | Freq: Two times a day (BID) | ORAL | Status: DC
  Filled 2021-09-02: qty 2

## 2021-09-02 MED ORDER — SODIUM BICARBONATE 650 MG PO TABS
1300.0000 mg | ORAL_TABLET | Freq: Three times a day (TID) | ORAL | Status: DC
  Administered 2021-09-03 – 2021-09-04 (×7): 1300 mg via ORAL
  Filled 2021-09-02 (×9): qty 2

## 2021-09-02 MED ORDER — MOMETASONE FURO-FORMOTEROL FUM 200-5 MCG/ACT IN AERO
2.0000 | INHALATION_SPRAY | Freq: Two times a day (BID) | RESPIRATORY_TRACT | Status: DC
  Administered 2021-09-04 – 2021-09-05 (×3): 2 via RESPIRATORY_TRACT
  Filled 2021-09-02 (×2): qty 8.8

## 2021-09-02 MED ORDER — LOSARTAN POTASSIUM 50 MG PO TABS
25.0000 mg | ORAL_TABLET | Freq: Every day | ORAL | Status: DC
  Filled 2021-09-02: qty 1

## 2021-09-02 MED ORDER — RIFAMPIN 300 MG PO CAPS
300.0000 mg | ORAL_CAPSULE | Freq: Two times a day (BID) | ORAL | Status: DC
  Administered 2021-09-03 – 2021-09-05 (×5): 300 mg via ORAL
  Filled 2021-09-02 (×8): qty 1

## 2021-09-02 MED ORDER — CALCITRIOL 0.25 MCG PO CAPS
0.2500 ug | ORAL_CAPSULE | Freq: Every day | ORAL | Status: DC
  Administered 2021-09-03 – 2021-09-05 (×3): 0.25 ug via ORAL
  Filled 2021-09-02 (×6): qty 1

## 2021-09-02 MED ORDER — FUROSEMIDE 10 MG/ML IJ SOLN
80.0000 mg | Freq: Two times a day (BID) | INTRAMUSCULAR | Status: DC
  Administered 2021-09-03 – 2021-09-05 (×5): 80 mg via INTRAVENOUS
  Filled 2021-09-02 (×5): qty 8

## 2021-09-02 MED ORDER — FENTANYL CITRATE PF 50 MCG/ML IJ SOSY
25.0000 ug | PREFILLED_SYRINGE | INTRAMUSCULAR | Status: DC | PRN
  Administered 2021-09-03: 50 ug via INTRAVENOUS
  Filled 2021-09-02 (×2): qty 1

## 2021-09-02 MED ORDER — ACETAMINOPHEN 650 MG RE SUPP: 650.0000 mg | Freq: Four times a day (QID) | RECTAL | Status: DC | PRN

## 2021-09-02 MED ORDER — CHLORHEXIDINE GLUCONATE CLOTH 2 % EX PADS: 6.0000 | MEDICATED_PAD | Freq: Every day | CUTANEOUS | Status: DC

## 2021-09-02 MED ORDER — SODIUM CHLORIDE 0.9% FLUSH
3.0000 mL | Freq: Two times a day (BID) | INTRAVENOUS | Status: DC
  Administered 2021-09-03 – 2021-09-05 (×5): 3 mL via INTRAVENOUS

## 2021-09-02 MED ORDER — HEPARIN SODIUM (PORCINE) 5000 UNIT/ML IJ SOLN
5000.0000 [IU] | Freq: Three times a day (TID) | INTRAMUSCULAR | Status: DC
  Administered 2021-09-03 – 2021-09-05 (×5): 5000 [IU] via SUBCUTANEOUS
  Filled 2021-09-02 (×6): qty 1

## 2021-09-02 MED ORDER — FUROSEMIDE 10 MG/ML IJ SOLN
80.0000 mg | Freq: Once | INTRAMUSCULAR | Status: AC
Start: 2021-09-02 — End: 2021-09-02
  Administered 2021-09-02: 80 mg via INTRAVENOUS
  Filled 2021-09-02: qty 8

## 2021-09-02 NOTE — ED Triage Notes (Signed)
Pt arrived POV from home c/o SHOB and CP x1 week. Pt states it hurts to lay on her side and she wakes up feeling like she can't breathe.

## 2021-09-02 NOTE — Consult Note (Signed)
Nephrology Consult   Assessment/Recommendations:   CKD 5, now ESRD: Underlying etiology of her CKD related to TMA? vs undifferentiated connective tissue disease, lupus (biopsy 2019 with TMA and fibrosis, no evidence of lupus). Historically, had been on HD until 08/2019. Follows with Dr. Moshe Cipro @ [place]. -last known Cr on 07/30/2021 was 5.6 w/ a eGFR of 10 -at this junction, she seems to be having uremic symptoms along with volume overload unfortunately. Mikenzie had a lengthy discussion about restarting dialysis which is indicated at this time. She is agreeable to this since she has been cognizant of her progressive decline in kidney function -Will plan for IHD later tomorrow-slow start protocol. Will need CLIP for outpatient HD placement. Has a LUE AVG which seems to be patent on exam -given history of lupus, will go ahead and check a urine sediment -Continue to monitor daily Cr, Dose meds for GFR<15 -Monitor Daily I/Os, Daily weight  -Maintain MAP>65 for optimal renal perfusion.  -Avoid further nephrotoxins including NSAIDS, Morphine.  Unless absolutely necessary, avoid CT with contrast and/or MRI with gadolinium.     SOB, vascular congestion -recommend lasix $RemoveBeforeDE'80mg'[id number]$  IV BID for now, titrate dose accordingly, can go up to $Rem'120mg'QYiN$  TID if needed until she is stable on IHD -had normal LV function on her exercise stress echo in 05/2021  Chest pain -per primary service  Hypertension: -resume home meds, IV diuresis as above  Metabolic acidosis secondary to CKD -starting nahco3 $RemoveBeforeDE'1300mg'[id number]$  TID, can d/c once stable on HD  Secondary hyperparathyroidism -resume renagel, low phos diet, PTH ordered  Anemia due to chronic kidney disease: -Transfuse for Hgb<7 g/dL -hgb relatively okay for now, will check an iron panel  Vascular access: -LUE AVG, placed 03/2019-seems to be patent on exam, left arm precautions  [facility] 09/02/2021 10:11  PM  _____________________________________________________________________________________   History of Present Illness: Linda [name] is a/an 26 y.o. female with a past medical history of CKD 5, HTN, SLE, TMA, G6PD deficiency, MCTD, calcinosis cutis, LVH who presents to [facility] with chest pain and SOB. Found to have interstitial edema/volume overload on CXR. Historically was on HD from 07/2018 to 08/2019 given some improvement in renal function however her renal function has been gradually declining.  She also reports left sided chest pain, DOE. She also reports n/v, dysgeusia, some brain fog which are new. She does report a chronic history of fluctuant appetite. She denies any fevers, chills, cough, dizziness, new skin lesions, issues with LUE AVF, intractable hiccups/pruritis. She reports that this does not feel like her lupus flare which is typically accompanied by new skin lesions.  She currently undergoing transplant evaluation at [facility].  Her renal biopsy in the past did show TMA with fibrosis but no lupus nephritis. She has a history of being on eculizumab in 2019 but none since then it seems. Patient follows with Dr. Moshe Cipro, [facility]. LUE brachial artery to axillary vein AVG placed in July 2020 by Dr. Lucky Cowboy.   Medications:  Current Facility-Administered Medications  Medication Dose Route Frequency Provider Last Rate Last Admin   furosemide (LASIX) injection 80 mg  80 mg Intravenous Once Kommor, [place], MD       Current Outpatient Medications  Medication Sig Dispense Refill   amoxicillin-clavulanate (AUGMENTIN) 875-125 MG tablet Take 1 tablet by mouth 2 (two) times daily. 1 tablet po BID x10 days 14 tablet 2   B Complex-C (B-COMPLEX WITH VITAMIN C) tablet Take 1 tablet by mouth daily.     busPIRone (  BUSPAR) 7.5 MG tablet Take 1 tablet (7.5 mg total) by mouth once as needed for up to 1 dose (1 hr before flight). (Patient not taking: No sig reported) 2 tablet 0   carvedilol (COREG) 6.25 MG  tablet Take 1 tablet (6.25 mg total) by mouth 2 (two) times daily with a meal. 60 tablet 3   furosemide (LASIX) 80 MG tablet Take 80 mg by mouth daily.     hydrOXYzine (ATARAX/VISTARIL) 25 MG tablet Take 1 tablet (25 mg total) by mouth every 6 (six) hours. (Patient not taking: No sig reported) 12 tablet 0   lidocaine (XYLOCAINE) 5 % ointment Apply 1 application topically as needed. (Patient not taking: Reported on 04/08/2021) 35.44 g 2   medroxyPROGESTERone (DEPO-PROVERA) 150 MG/ML injection Inject 1 mL (150 mg total) into the muscle every 3 (three) months. 1 mL 4   pantoprazole (PROTONIX) 40 MG tablet Take 1 tablet (40 mg total) by mouth 2 (two) times daily. 60 tablet 0   promethazine (PHENERGAN) 25 MG tablet Take 1 tablet (25 mg total) by mouth every 6 (six) hours as needed for nausea. 30 tablet 0   sevelamer (RENAGEL) 800 MG tablet Take 800 mg by mouth 3 (three) times daily. (Patient not taking: No sig reported)     spironolactone (ALDACTONE) 25 MG tablet Take 25 mg by mouth daily. (Patient not taking: No sig reported)  1   valACYclovir (VALTREX) 1000 MG tablet Take 1 tablet twice a day for 3 days with each outbreak. (Patient not taking: No sig reported) 30 tablet 11     ALLERGIES Lisinopril, Ativan [lorazepam], and Vicodin [hydrocodone-acetaminophen]  MEDICAL HISTORY Past Medical History:  Diagnosis Date   Abnormal ECG    a. In setting of LVH w/ repolarization abnormalities; b. 03/2019 MV: No ischemia/infarct. EF 55-65%.   Angioedema    ESRD (end stage renal disease) ([place])    a. prev on HD.   GERD (gastroesophageal reflux disease)    Hypertension    Lupus (HCC)    LVH (left ventricular hypertrophy)    a. 11/2017 Echo: EF 50-55%. triv AI. Mild MR. Mod L and mild R atrial enlargement. Nl RV size/fxn. Small pericardial effusion w/o tampondae; b. 03/2018 Echo: EF 55-60%, restrictive filling pattern. Nl RV size/fxn. Sev LAE. Mild MR, mild to mod TR/PR. Mod PAH. Triv effusion.   Migraines     Mixed connective tissue disease ([place])    Previous Dialysis patient [facility])    a. Off HD since late 2020.   Septic prepatellar bursitis of right knee 08/19/2017     SOCIAL HISTORY Social History   Socioeconomic History   Marital status: Single    Spouse name: Not on file   Number of children: Not on file   Years of education: Not on file   Highest education level: Not on file  Occupational History   Not on file  Tobacco Use   Smoking status: Every Day    Packs/day: 0.25    Years: 5.00    Pack years: 1.25    Types: E-cigarettes, Cigarettes    Start date: 02/11/2015   Smokeless tobacco: Never  Vaping Use   Vaping Use: Every day  Substance and Sexual Activity   Alcohol use: No    Alcohol/week: 0.0 standard drinks   Drug use: No   Sexual activity: Yes    Partners: Male    Birth control/protection: None  Other Topics Concern   Not on file  Social History Narrative   Lives  with boyfriend   Social Determinants of Radio broadcast assistant Strain: Not on file  Food Insecurity: Not on file  Transportation Needs: Not on file  Physical Activity: Not on file  Stress: Not on file  Social Connections: Not on file  Intimate Partner Violence: Not on file     FAMILY HISTORY Family History  Problem Relation Age of Onset   Lupus Mother    Hypertension Mother    Kidney disease Mother    Heart Problems Mother    Coronary artery disease Mother 45       stent   Diabetes Maternal Grandmother     Review of Systems: 12 systems reviewed Otherwise as per HPI, all other systems reviewed and negative  Physical Exam: Vitals:   09/02/21 1310 09/02/21 2036  BP: (!) 187/137 (!) 174/130  Pulse: 97 89  Resp: 18 16  Temp: 98.6 F (37 C)   SpO2: 100% 100%   No intake/output data recorded. No intake or output data in the 24 hours ending 09/02/21 2211 General: well-appearing, no acute distress CV: regular rate, normal rhythm, no murmurs, no gallops, no rubs Lungs: diminished air  entry bibasilar, unlabored, no overt w/r/r/c appreciated, speaking in full sentences Abd: soft, non-tender, non-distended Skin: healed/old wounds b/l Le's Musculoskeletal: trace edema b/l LE's Neuro: normal speech, no gross focal deficits, no asterixis Dialysis access: LUE AVG +b/t, overlying skin intact  Test Results Reviewed Lab Results  Component Value Date   NA 135 09/02/2021   K 4.4 09/02/2021   CL 108 09/02/2021   CO2 14 (L) 09/02/2021   BUN 54 (H) 09/02/2021   CREATININE 5.67 (H) 09/02/2021   CALCIUM 8.6 (L) 09/02/2021   ALBUMIN 2.4 (L) 08/22/2018     I have reviewed all relevant outside healthcare records related to the patient's kidney injury.

## 2021-09-02 NOTE — H&P (Signed)
History and Physical    OTA EBERSOLE ZSW:[id number] DOB: 1995-11-18 DOA: 09/02/2021  PCP: Pcp, No   Patient coming from: Home   Chief Complaint: SOB, chest pain   HPI: Linda [name] is a very pleasant 26 y.o. female with medical history significant for SLE, mixed CTD, TMA, hypertension, latent TB, and CKD, now presenting with shortness of breath and chest discomfort.  Patient reports some mild dyspnea beginning approximately a month ago that was associated with URI symptoms and seem to be improving initially before she developed significant worsening over the past 2 days.  She has had shortness of breath with very mild exertion, has difficulty sleeping, waking due to acute dyspnea, and has had chest discomfort more on the left.  She has had some nausea and loss of appetite. She denies any fevers or chills.  She has a nonproductive cough.  Denies leg swelling or tenderness.  ED Course: Upon arrival to the ED, patient is found to be afebrile, saturating well on room air, hypertensive to 190/130s.  EKG features sinus rhythm with RSR' in V1 and QTc 500 ms.  Chest x-ray concerning for interstitial edema and small bilateral pleural effusions.  Chemistry panel with bicarbonate 14, BUN 54, and creatinine 5.67.  CBC with hemoglobin 10.4.  Nephrology was consulted by the ED physician, IV Lasix was administered, and hospitalists asked to admit.   Review of Systems:  All other systems reviewed and apart from HPI, are negative.  Past Medical History:  Diagnosis Date   Abnormal ECG    a. In setting of LVH w/ repolarization abnormalities; b. 03/2019 MV: No ischemia/infarct. EF 55-65%.   Angioedema    ESRD (end stage renal disease) ([place])    a. prev on HD.   GERD (gastroesophageal reflux disease)    Hypertension    Lupus (HCC)    LVH (left ventricular hypertrophy)    a. 11/2017 Echo: EF 50-55%. triv AI. Mild MR. Mod L and mild R atrial enlargement. Nl RV size/fxn. Small pericardial effusion w/o  tampondae; b. 03/2018 Echo: EF 55-60%, restrictive filling pattern. Nl RV size/fxn. Sev LAE. Mild MR, mild to mod TR/PR. Mod PAH. Triv effusion.   Migraines    Mixed connective tissue disease ([place])    Previous Dialysis patient [facility])    a. Off HD since late 2020.   Septic prepatellar bursitis of right knee 08/19/2017    Past Surgical History:  Procedure Laterality Date   GRAFT APPLICATION Left 11/19/252   Procedure: BRACHIAL CEPHALIC GRAFT CREATION;  Surgeon: Algernon Huxley, MD;  Location: [facility] ORS;  Service: Vascular;  Laterality: Left;   I & D EXTREMITY Right 08/06/2017   Procedure: IRRIGATION AND DEBRIDEMENT KNEE;  Surgeon: Shona Needles, MD;  Location: [facility];  Service: Orthopedics;  Laterality: Right;   MULTIPLE TOOTH EXTRACTIONS      Social History:   reports that she has been smoking e-cigarettes. She started smoking about 6 years ago. She has a 1.25 pack-year smoking history. She has never used smokeless tobacco. She reports that she does not drink alcohol and does not use drugs.  Allergies  Allergen Reactions   Lisinopril Anaphylaxis    Angioedema   Ativan [Lorazepam] Other (See Comments)    Somnolent with 1mg  ativan [facility] 2019   Vicodin [Hydrocodone-Acetaminophen] Itching and Rash    Family History  Problem Relation Age of Onset   Lupus Mother    Hypertension Mother    Kidney disease Mother  Heart Problems Mother    Coronary artery disease Mother 44       stent   Diabetes Maternal Grandmother      Prior to Admission medications   Medication Sig Start Date End Date Taking? Authorizing Provider  amLODipine (NORVASC) 10 MG tablet Take 10 mg by mouth daily. 08/05/21  Yes [provider]  B Complex-C (B-COMPLEX WITH VITAMIN C) tablet Take 1 tablet by mouth daily.   Yes [provider]  calcitRIOL (ROCALTROL) 0.25 MCG capsule Take 0.25 mcg by mouth daily. 08/01/21  Yes [provider]  carvedilol (COREG) 6.25 MG tablet Take 1  tablet (6.25 mg total) by mouth 2 (two) times daily with a meal. 03/09/18  Yes Harriet Butte, DO  furosemide (LASIX) 80 MG tablet Take 80 mg by mouth daily.   Yes [provider]  losartan (COZAAR) 25 MG tablet Take 25 mg by mouth daily. 08/05/21  Yes [provider]  rifampin (RIFADIN) 300 MG capsule Take 300 mg by mouth 2 (two) times daily. 08/22/21  Yes [provider]  SYMBICORT 160-4.5 MCG/ACT inhaler Inhale 1 puff into the lungs daily as needed (For shortness of breath). 07/29/21  Yes [provider]  amoxicillin-clavulanate (AUGMENTIN) 875-125 MG tablet Take 1 tablet by mouth 2 (two) times daily. 1 tablet po BID x10 days Patient not taking: Reported on 09/02/2021 04/08/21   Shelly Bombard, MD  busPIRone (BUSPAR) 7.5 MG tablet Take 1 tablet (7.5 mg total) by mouth once as needed for up to 1 dose (1 hr before flight). Patient not taking: Reported on 02/23/2020 11/03/19   Caroline More, DO  hydrOXYzine (ATARAX/VISTARIL) 25 MG tablet Take 1 tablet (25 mg total) by mouth every 6 (six) hours. Patient not taking: Reported on 02/23/2020 07/06/19   Shirley, [place], DO  lidocaine (XYLOCAINE) 5 % ointment Apply 1 application topically as needed. Patient not taking: Reported on 04/08/2021 11/25/20   Elvera Maria, CNM  medroxyPROGESTERone (DEPO-PROVERA) 150 MG/ML injection Inject 1 mL (150 mg total) into the muscle every 3 (three) months. Patient not taking: Reported on 09/02/2021 04/08/21   Shelly Bombard, MD  pantoprazole (PROTONIX) 40 MG tablet Take 1 tablet (40 mg total) by mouth 2 (two) times daily. Patient not taking: Reported on 09/02/2021 01/28/18 09/02/21  Alma Friendly, MD  promethazine (PHENERGAN) 25 MG tablet Take 1 tablet (25 mg total) by mouth every 6 (six) hours as needed for nausea. Patient not taking: Reported on 09/02/2021 07/14/18   Shelly Bombard, MD  valACYclovir (VALTREX) 1000 MG tablet Take 1 tablet twice a day for 3 days with each  outbreak. Patient not taking: Reported on 02/23/2020 07/14/18   Shelly Bombard, MD    Physical Exam: Vitals:   09/02/21 1308 09/02/21 1310 09/02/21 2036 09/02/21 2316  BP:  (!) 187/137 (!) 174/130 (!) 188/143  Pulse:  97 89 (!) 101  Resp:  18 16 19   Temp:  98.6 F (37 C)    TempSrc:  Oral    SpO2:  100% 100% 100%  Weight: 79.8 kg     Height: 5\' 6"  (1.676 m)       Constitutional: NAD, calm  Eyes: PERTLA, lids and conjunctivae normal ENMT: Mucous membranes are moist. Posterior pharynx clear of any exudate or lesions.   Neck: supple, no masses  Respiratory: Speaking full sentences. Diffuse rales. No cyanosis.  Cardiovascular: S1 & S2 heard, regular rate and rhythm. No extremity edema.   Abdomen: No distension, no  tenderness, soft. Bowel sounds active.  Musculoskeletal: no clubbing / cyanosis. No joint deformity upper and lower extremities.   Skin: Hyperpigmented macules and papules diffusely. Warm, dry, well-perfused. Neurologic: CN 2-12 grossly intact. Moving all extremities. Alert and oriented.  Psychiatric: Very pleasant. Cooperative.    Labs and Imaging on Admission: I have personally reviewed following labs and imaging studies  CBC: Recent Labs  Lab 09/02/21 1322  WBC 8.9  HGB 10.4*  HCT 34.1*  MCV 103.0*  PLT 696   Basic Metabolic Panel: Recent Labs  Lab 09/02/21 1322  NA 135  K 4.4  CL 108  CO2 14*  GLUCOSE 72  BUN 54*  CREATININE 5.67*  CALCIUM 8.6*   GFR: Estimated Creatinine Clearance: 16 mL/min (A) (by C-G formula based on SCr of 5.67 mg/dL (H)). Liver Function Tests: No results for input(s): AST, ALT, ALKPHOS, BILITOT, PROT, ALBUMIN in the last 168 hours. No results for input(s): LIPASE, AMYLASE in the last 168 hours. No results for input(s): AMMONIA in the last 168 hours. Coagulation Profile: No results for input(s): INR, PROTIME in the last 168 hours. Cardiac Enzymes: No results for input(s): CKTOTAL, CKMB, CKMBINDEX, TROPONINI in the last  168 hours. BNP (last 3 results) No results for input(s): PROBNP in the last 8760 hours. HbA1C: No results for input(s): HGBA1C in the last 72 hours. CBG: No results for input(s): GLUCAP in the last 168 hours. Lipid Profile: No results for input(s): CHOL, HDL, LDLCALC, TRIG, CHOLHDL, LDLDIRECT in the last 72 hours. Thyroid Function Tests: No results for input(s): TSH, T4TOTAL, FREET4, T3FREE, THYROIDAB in the last 72 hours. Anemia Panel: No results for input(s): VITAMINB12, FOLATE, FERRITIN, TIBC, IRON, RETICCTPCT in the last 72 hours. Urine analysis:    Component Value Date/Time   COLORURINE YELLOW 08/22/2018 [place] 08/22/2018 1207   LABSPEC 1.015 08/22/2018 1207   PHURINE 8.0 08/22/2018 [place] 08/22/2018 1207   HGBUR NEGATIVE 08/22/2018 1207   BILIRUBINUR NEGATIVE 08/22/2018 1207   BILIRUBINUR Negative 02/10/2018 1602   KETONESUR NEGATIVE 08/22/2018 1207   PROTEINUR 100 (A) 08/22/2018 1207   UROBILINOGEN 0.2 02/10/2018 1602   UROBILINOGEN 1.0 03/30/2015 2328   NITRITE NEGATIVE 08/22/2018 1207   LEUKOCYTESUR NEGATIVE 08/22/2018 1207   Sepsis Labs: @LABRCNTIP (procalcitonin:4,lacticidven:4) )No results found for this or any previous visit (from the past 240 hour(s)).   Radiological Exams on Admission: DG Chest 2 View  Result Date: 09/02/2021 CLINICAL DATA:  Chest pain.  Lupus EXAM: CHEST - 2 VIEW COMPARISON:  08/22/2018 FINDINGS: Cardiac enlargement. Interval development of vascular congestion and mild interstitial edema compatible with fluid overload. Small bilateral effusions. Mild bibasilar atelectasis. Central line has been removed since the prior study IMPRESSION: Findings compatible with fluid overload with interstitial edema and small pleural effusions. Electronically Signed   By: Franchot Gallo M.D.   On: 09/02/2021 13:54    EKG: Independently reviewed. Sinus rhythm, RSR' in V1, QTc 500 ms.   Assessment/Plan  1. Hypervolemia;  metabolic acidosis; CKD V  - Hx of ESRD not dialyzed in ~2 yrs but with functional AVG in LUE p/w progressive SOB and chest pain, also nausea and loss of appetite  - Appreciate nephrology consultants, planning to dialyze   - Continue diuresis with 80 mg IV Lasix, continue sodium bicarbonate, restrict fluids, avoid nephrotoxins, renally-dose medications, monitor I/Os, weight, and serum chemistries   2. Chest pain  - Presents with 2 days of SOB, cough, and chest discomfort  - CXR with  pulmonary edema and small pleural effusions, troponin 52 then 50, and EKG similar to March '21 but ST-T abnormalities improved  - Stress echo on 05/29/21 with normal LV fxn and wall-motion during stress but non-diagnostic d/t target HR not reached  - Most likely related to hypervolemia w/ pulm edema and effusions, plan to monitor while treating hypervolemia as above    3. Hypertension  - BP as high as 187/137 in ED, anticipate improvement with diuresis  - Continue IV Lasix, Norvasac, Coreg, losartan   4. SLE  - Follows at [facility], was planning to start thalidomide but hasn't yet, not currently on prednisone either, does not feel she is having a flare   5. Anemia  - Stable, no bleeding, transfuse if needed   6. Latent TB  - Continue rifampin     DVT prophylaxis: sq heparin  Code Status: Full  Level of Care: Level of care: Progressive Family Communication: None present  Disposition Plan:  Patient is from: home  Anticipated d/c is to: Home  Anticipated d/c date is: 09/05/21 Patient currently: Pending improvement in respiratory status, re-initiation of HD Consults called: nephrology  Admission status: Inpatient    Vianne Bulls, MD [facility]  09/02/2021, 11:57 PM

## 2021-09-02 NOTE — ED Provider Notes (Addendum)
[facility] DEPARTMENT Provider Note   CSN: [id number] Arrival date & time: 09/02/21  1255     History  Chief Complaint  Patient presents with   Chest Pain   Shortness of Breath    Linda [name] is a 26 y.o. female with PMH ESRD secondary to lupus nephritis previously on dialysis but with returned kidney function currently not on dialysis, HTN, mixed congestive tissue disease, angioedema who presents emergency department for evaluation of chest pain and shortness of breath.  Patient states that for the last 1 week she has had progressively worsening dyspnea on exertion and she has chest pain when rolling to the left side.  Denies abdominal pain, nausea, vomiting, fever or other systemic symptoms.  Patient does still make urine and states that she feels that she has been urinating normally.  Baseline creatinine 5.5.    Chest Pain Associated symptoms: shortness of breath   Shortness of Breath Associated symptoms: chest pain       Past Medical History:  Diagnosis Date   Abnormal ECG    a. In setting of LVH w/ repolarization abnormalities; b. 03/2019 MV: No ischemia/infarct. EF 55-65%.   Angioedema    ESRD (end stage renal disease) ([place])    a. prev on HD.   GERD (gastroesophageal reflux disease)    Hypertension    Lupus (HCC)    LVH (left ventricular hypertrophy)    a. 11/2017 Echo: EF 50-55%. triv AI. Mild MR. Mod L and mild R atrial enlargement. Nl RV size/fxn. Small pericardial effusion w/o tampondae; b. 03/2018 Echo: EF 55-60%, restrictive filling pattern. Nl RV size/fxn. Sev LAE. Mild MR, mild to mod TR/PR. Mod PAH. Triv effusion.   Migraines    Mixed connective tissue disease ([place])    Previous Dialysis patient [facility])    a. Off HD since late 2020.   Septic prepatellar bursitis of right knee 08/19/2017    Home Medications Prior to Admission medications   Medication Sig Start Date End Date Taking? Authorizing Provider  amLODipine (NORVASC) 10  MG tablet Take 10 mg by mouth daily. 08/05/21  Yes [provider]  B Complex-C (B-COMPLEX WITH VITAMIN C) tablet Take 1 tablet by mouth daily.   Yes [provider]  calcitRIOL (ROCALTROL) 0.25 MCG capsule Take 0.25 mcg by mouth daily. 08/01/21  Yes [provider]  carvedilol (COREG) 6.25 MG tablet Take 1 tablet (6.25 mg total) by mouth 2 (two) times daily with a meal. 03/09/18  Yes Harriet Butte, DO  furosemide (LASIX) 80 MG tablet Take 80 mg by mouth daily.   Yes [provider]  losartan (COZAAR) 25 MG tablet Take 25 mg by mouth daily. 08/05/21  Yes [provider]  rifampin (RIFADIN) 300 MG capsule Take 300 mg by mouth 2 (two) times daily. 08/22/21  Yes [provider]  SYMBICORT 160-4.5 MCG/ACT inhaler Inhale 1 puff into the lungs daily as needed (For shortness of breath). 07/29/21  Yes [provider]  amoxicillin-clavulanate (AUGMENTIN) 875-125 MG tablet Take 1 tablet by mouth 2 (two) times daily. 1 tablet po BID x10 days Patient not taking: Reported on 09/02/2021 04/08/21   Shelly Bombard, MD  busPIRone (BUSPAR) 7.5 MG tablet Take 1 tablet (7.5 mg total) by mouth once as needed for up to 1 dose (1 hr before flight). Patient not taking: Reported on 02/23/2020 11/03/19   Caroline More, DO  hydrOXYzine (ATARAX/VISTARIL) 25 MG tablet Take 1 tablet (25 mg total) by mouth  every 6 (six) hours. Patient not taking: Reported on 02/23/2020 07/06/19   Shirley, [place], DO  lidocaine (XYLOCAINE) 5 % ointment Apply 1 application topically as needed. Patient not taking: Reported on 04/08/2021 11/25/20   Elvera Maria, CNM  medroxyPROGESTERone (DEPO-PROVERA) 150 MG/ML injection Inject 1 mL (150 mg total) into the muscle every 3 (three) months. Patient not taking: Reported on 09/02/2021 04/08/21   Shelly Bombard, MD  pantoprazole (PROTONIX) 40 MG tablet Take 1 tablet (40 mg total) by mouth 2 (two) times daily. Patient not taking: Reported on  09/02/2021 01/28/18 09/02/21  Alma Friendly, MD  promethazine (PHENERGAN) 25 MG tablet Take 1 tablet (25 mg total) by mouth every 6 (six) hours as needed for nausea. Patient not taking: Reported on 09/02/2021 07/14/18   Shelly Bombard, MD  valACYclovir (VALTREX) 1000 MG tablet Take 1 tablet twice a day for 3 days with each outbreak. Patient not taking: Reported on 02/23/2020 07/14/18   Shelly Bombard, MD      Allergies    Lisinopril, Ativan [lorazepam], and Vicodin [hydrocodone-acetaminophen]    Review of Systems   Review of Systems  Respiratory:  Positive for shortness of breath.   Cardiovascular:  Positive for chest pain.   Physical Exam Updated Vital Signs BP (!) 188/143 (BP Location: Right Arm)    Pulse (!) 101    Temp 98.6 F (37 C) (Oral)    Resp 19    Ht 5\' 6"  (1.676 m)    Wt 79.8 kg    LMP 09/02/2021    SpO2 100%    BMI 28.41 kg/m  Physical Exam Vitals and nursing note reviewed.  Constitutional:      General: She is not in acute distress.    Appearance: She is well-developed.  HENT:     Head: Normocephalic and atraumatic.  Eyes:     Conjunctiva/sclera: Conjunctivae normal.  Cardiovascular:     Rate and Rhythm: Normal rate and regular rhythm.     Heart sounds: No murmur heard. Pulmonary:     Effort: Pulmonary effort is normal. No respiratory distress.     Breath sounds: Rales present.  Abdominal:     Palpations: Abdomen is soft.     Tenderness: There is no abdominal tenderness.  Musculoskeletal:        General: No swelling.     Cervical back: Neck supple.  Skin:    General: Skin is warm and dry.     Capillary Refill: Capillary refill takes less than 2 seconds.  Neurological:     Mental Status: She is alert.  Psychiatric:        Mood and Affect: Mood normal.    ED Results / Procedures / Treatments   Labs (all labs ordered are listed, but only abnormal results are displayed) Labs Reviewed  BASIC METABOLIC PANEL - Abnormal; Notable for the following  components:      Result Value   CO2 14 (*)    BUN 54 (*)    Creatinine, Ser 5.67 (*)    Calcium 8.6 (*)    GFR, Estimated 10 (*)    All other components within normal limits  CBC - Abnormal; Notable for the following components:   RBC 3.31 (*)    Hemoglobin 10.4 (*)    HCT 34.1 (*)    MCV 103.0 (*)    All other components within normal limits  TROPONIN I (HIGH SENSITIVITY) - Abnormal; Notable for the following components:   Troponin I (High  Sensitivity) 52 (*)    All other components within normal limits  TROPONIN I (HIGH SENSITIVITY) - Abnormal; Notable for the following components:   Troponin I (High Sensitivity) 50 (*)    All other components within normal limits  FERRITIN  IRON AND TIBC  PARATHYROID HORMONE, INTACT (NO CA)  URINALYSIS, ROUTINE W REFLEX MICROSCOPIC  HIV ANTIBODY (ROUTINE TESTING W REFLEX)  BASIC METABOLIC PANEL  CBC  I-STAT BETA HCG BLOOD, ED ([facility], [facility], [facility] ONLY)    EKG None  Radiology DG Chest 2 View  Result Date: 09/02/2021 CLINICAL DATA:  Chest pain.  Lupus EXAM: CHEST - 2 VIEW COMPARISON:  08/22/2018 FINDINGS: Cardiac enlargement. Interval development of vascular congestion and mild interstitial edema compatible with fluid overload. Small bilateral effusions. Mild bibasilar atelectasis. Central line has been removed since the prior study IMPRESSION: Findings compatible with fluid overload with interstitial edema and small pleural effusions. Electronically Signed   By: Franchot Gallo M.D.   On: 09/02/2021 13:54    Procedures .Critical Care Performed by: Teressa Lower, MD Authorized by: Teressa Lower, MD   Critical care provider statement:    Critical care time (minutes):  30   Critical care was necessary to treat or prevent imminent or life-threatening deterioration of the following conditions:  Cardiac failure   Critical care was time spent personally by me on the following activities:  Development of treatment plan with patient or surrogate,  discussions with consultants, evaluation of patient's response to treatment, examination of patient, ordering and review of laboratory studies, ordering and review of radiographic studies, ordering and performing treatments and interventions, pulse oximetry, re-evaluation of patient's condition and review of old charts    Medications Ordered in ED Medications  sodium bicarbonate tablet 1,300 mg (has no administration in time range)  fentaNYL (SUBLIMAZE) injection 25-50 mcg (has no administration in time range)  Chlorhexidine Gluconate Cloth 2 % PADS 6 each (has no administration in time range)  heparin injection 5,000 Units (has no administration in time range)  sodium chloride flush (NS) 0.9 % injection 3 mL (has no administration in time range)  acetaminophen (TYLENOL) tablet 650 mg (has no administration in time range)    Or  acetaminophen (TYLENOL) suppository 650 mg (has no administration in time range)  amLODipine (NORVASC) tablet 10 mg (has no administration in time range)  calcitRIOL (ROCALTROL) capsule 0.25 mcg (has no administration in time range)  carvedilol (COREG) tablet 6.25 mg (has no administration in time range)  rifampin (RIFADIN) capsule 300 mg (has no administration in time range)  mometasone-formoterol (DULERA) 200-5 MCG/ACT inhaler 2 puff (has no administration in time range)  losartan (COZAAR) tablet 25 mg (has no administration in time range)  furosemide (LASIX) injection 80 mg (has no administration in time range)  furosemide (LASIX) injection 80 mg (80 mg Intravenous Given 09/02/21 2302)    ED Course/ Medical Decision Making/ A&P                           Medical Decision Making  Patient seen the emergency department for evaluation of shortness of breath and chest pain.  Physical exam reveals rales bilaterally but no lower extremity edema.  Patient also has a malar rash consistent with her lupus.  Chest x-ray with pulmonary edema bilaterally.  I personally reviewed  this x-ray and this is confirmed by radiology.  Laboratory evaluation with a hemoglobin of 10.4 with an MCV of 103.0, chemistry with CO2 of 14,  BUN 54, creatinine 5.67 which is at the patient's baseline per nephrology.  I spoke directly with nephrology about the patient's creatinine as she does have a fistula if we wanted to pursue dialysis.  Nephrology recommending diuresis with 80 mg IV Lasix and we will not pursue dialysis at this time.  Patient did have elevated troponin to 52 with repeat delta troponin is 50 likely type II demand ischemia.  Patient then admitted to medicine for diuresis in the setting of fluid overload.         Final Clinical Impression(s) / ED Diagnoses Final diagnoses:  None    Rx / DC Orders ED Discharge Orders     None         Dre Gamino, MD 09/03/21 0000    Teressa Lower, MD 09/03/21 0000

## 2021-09-03 DIAGNOSIS — E8770 Fluid overload, unspecified: Secondary | ICD-10-CM | POA: Diagnosis not present

## 2021-09-03 DIAGNOSIS — N289 Disorder of kidney and ureter, unspecified: Secondary | ICD-10-CM | POA: Diagnosis not present

## 2021-09-03 LAB — RESP PANEL BY RT-PCR (FLU A&B, COVID) ARPGX2
Influenza A by PCR: NEGATIVE
Influenza B by PCR: NEGATIVE
SARS Coronavirus 2 by RT PCR: NEGATIVE

## 2021-09-03 LAB — CBC
HCT: 31.9 % — ABNORMAL LOW (ref 36.0–46.0)
Hemoglobin: 10 g/dL — ABNORMAL LOW (ref 12.0–15.0)
MCH: 31.9 pg (ref 26.0–34.0)
MCHC: 31.3 g/dL (ref 30.0–36.0)
MCV: 101.9 fL — ABNORMAL HIGH (ref 80.0–100.0)
Platelets: 160 10*3/uL (ref 150–400)
RBC: 3.13 MIL/uL — ABNORMAL LOW (ref 3.87–5.11)
RDW: 14.1 % (ref 11.5–15.5)
WBC: 10.5 10*3/uL (ref 4.0–10.5)
nRBC: 0 % (ref 0.0–0.2)

## 2021-09-03 LAB — FERRITIN: Ferritin: 382 ng/mL — ABNORMAL HIGH (ref 11–307)

## 2021-09-03 LAB — BASIC METABOLIC PANEL
Anion gap: 13 (ref 5–15)
BUN: 53 mg/dL — ABNORMAL HIGH (ref 6–20)
CO2: 16 mmol/L — ABNORMAL LOW (ref 22–32)
Calcium: 8.7 mg/dL — ABNORMAL LOW (ref 8.9–10.3)
Chloride: 106 mmol/L (ref 98–111)
Creatinine, Ser: 5.46 mg/dL — ABNORMAL HIGH (ref 0.44–1.00)
GFR, Estimated: 10 mL/min — ABNORMAL LOW (ref >60)
Glucose, Bld: 80 mg/dL (ref 70–99)
Potassium: 3.9 mmol/L (ref 3.5–5.1)
Sodium: 135 mmol/L (ref 135–145)

## 2021-09-03 LAB — IRON AND TIBC
Iron: 53 ug/dL (ref 28–170)
Saturation Ratios: 20 % (ref 10.4–31.8)
TIBC: 260 ug/dL (ref 250–450)
UIBC: 207 ug/dL

## 2021-09-03 LAB — HEPATITIS B SURFACE ANTIGEN: Hepatitis B Surface Ag: NONREACTIVE

## 2021-09-03 LAB — HIV ANTIBODY (ROUTINE TESTING W REFLEX): HIV Screen 4th Generation wRfx: NONREACTIVE

## 2021-09-03 LAB — HEPATITIS B SURFACE ANTIBODY,QUALITATIVE: Hep B S Ab: REACTIVE — AB

## 2021-09-03 MED ORDER — LIDOCAINE HCL (PF) 1 % IJ SOLN: 5.0000 mL | INTRAMUSCULAR | Status: DC | PRN

## 2021-09-03 MED ORDER — OXYCODONE HCL 5 MG PO TABS
15.0000 mg | ORAL_TABLET | Freq: Four times a day (QID) | ORAL | Status: DC | PRN
  Administered 2021-09-03 – 2021-09-04 (×3): 15 mg via ORAL
  Filled 2021-09-03 (×3): qty 3

## 2021-09-03 MED ORDER — HEPARIN SODIUM (PORCINE) 1000 UNIT/ML DIALYSIS
1000.0000 [IU] | INTRAMUSCULAR | Status: DC | PRN
  Filled 2021-09-03: qty 1

## 2021-09-03 MED ORDER — SODIUM CHLORIDE 0.9 % IV SOLN: 100.0000 mL | INTRAVENOUS | Status: DC | PRN

## 2021-09-03 MED ORDER — HYDROMORPHONE HCL 1 MG/ML IJ SOLN
0.5000 mg | INTRAMUSCULAR | Status: DC | PRN
  Administered 2021-09-03 (×3): 0.5 mg via INTRAVENOUS
  Filled 2021-09-03: qty 1

## 2021-09-03 MED ORDER — LIDOCAINE-PRILOCAINE 2.5-2.5 % EX CREA
1.0000 "application " | TOPICAL_CREAM | CUTANEOUS | Status: DC | PRN
  Filled 2021-09-03: qty 5

## 2021-09-03 MED ORDER — CARVEDILOL 12.5 MG PO TABS
12.5000 mg | ORAL_TABLET | Freq: Two times a day (BID) | ORAL | Status: DC
  Administered 2021-09-03 – 2021-09-05 (×4): 12.5 mg via ORAL
  Filled 2021-09-03 (×3): qty 1

## 2021-09-03 MED ORDER — ALTEPLASE 2 MG IJ SOLR: 2.0000 mg | Freq: Once | INTRAMUSCULAR | Status: DC | PRN

## 2021-09-03 MED ORDER — HYDROMORPHONE HCL 1 MG/ML IJ SOLN
INTRAMUSCULAR | Status: AC
  Filled 2021-09-03: qty 1

## 2021-09-03 MED ORDER — DIPHENHYDRAMINE HCL 25 MG PO CAPS: 25.0000 mg | ORAL_CAPSULE | Freq: Three times a day (TID) | ORAL | Status: DC | PRN

## 2021-09-03 MED ORDER — HYDRALAZINE HCL 20 MG/ML IJ SOLN
10.0000 mg | Freq: Four times a day (QID) | INTRAMUSCULAR | Status: DC | PRN
  Administered 2021-09-03 – 2021-09-04 (×3): 10 mg via INTRAVENOUS
  Filled 2021-09-03 (×3): qty 1

## 2021-09-03 MED ORDER — LOSARTAN POTASSIUM 25 MG PO TABS
25.0000 mg | ORAL_TABLET | Freq: Every day | ORAL | Status: DC
  Administered 2021-09-03 – 2021-09-05 (×3): 25 mg via ORAL
  Filled 2021-09-03 (×2): qty 1

## 2021-09-03 MED ORDER — CARVEDILOL 3.125 MG PO TABS
6.2500 mg | ORAL_TABLET | Freq: Two times a day (BID) | ORAL | Status: DC
  Administered 2021-09-03: 6.25 mg via ORAL

## 2021-09-03 MED ORDER — PENTAFLUOROPROP-TETRAFLUOROETH EX AERO
1.0000 "application " | INHALATION_SPRAY | CUTANEOUS | Status: DC | PRN
  Filled 2021-09-03: qty 116

## 2021-09-03 MED ORDER — AMLODIPINE BESYLATE 10 MG PO TABS
10.0000 mg | ORAL_TABLET | Freq: Every day | ORAL | Status: DC
  Administered 2021-09-03 – 2021-09-05 (×3): 10 mg via ORAL
  Filled 2021-09-03 (×2): qty 1

## 2021-09-03 NOTE — Progress Notes (Signed)
PROGRESS NOTE                                                                                                                                                                                                             Patient Demographics:    Linda [name], is a 26 y.o. female, DOB - 25-May-1996, EBR:[id number]  Outpatient Primary MD for the patient is Pcp, No    LOS - 1  Admit date - 09/02/2021    Chief Complaint  Patient presents with   Chest Pain   Shortness of Breath       Brief Narrative (HPI from H&P)   Linda [name] is a very pleasant 26 y.o. female with medical history significant for SLE, mixed CTD, TMA, hypertension, latent TB, and CKD, now presenting with shortness of breath and chest discomfort, she was diagnosed with pulmonary edema due to fluid overload and admitted to the hospital.   Subjective:    Linda [name] today has, No headache, No chest pain, No abdominal pain - No Nausea, No new weakness tingling or numbness, no SOB.   Assessment  & Plan :     Acute Hypoxic Resp. Failure due to cute pulmonary edema caused by fluid excess in the setting of ESRD - she had longstanding CKD 5 but has now progressed to ESRD, HD will be started this admission for fluid removal, no chest discomfort at this time.  Overall feels better continue supportive care and await HD.  2.  HTN.  On Norvasc, Coreg and losartan.  Coreg dose doubled, added as needed hydralazine added.  Commence HD for fluid removal.  3.  SLE.  Currently not on any active treatment follows with [facility].  4.  CKD 5 now progressing to ESRD due to lupus nephritis.  Plan as in #1 above.  5.  Latent TB.  On rifampin.  6.  AOCD.  Monitor         Condition - Extremely Guarded  Family Communication  :  none present  Code Status :  Full  Consults  :  Renal  PUD Prophylaxis :    Procedures  :            Disposition  Plan  :    Status is: Inpatient  Remains inpatient appropriate because: Pulm edema   DVT Prophylaxis  :  heparin injection 5,000 Units Start: 09/03/21 0600  Lab Results  Component Value Date   PLT 160 09/03/2021    Diet :  Diet Order             Diet renal with fluid restriction Fluid restriction: 1200 mL Fluid; Room service appropriate? Yes; Fluid consistency: Thin  Diet effective now                    Inpatient Medications  Scheduled Meds:  amLODipine  10 mg Oral Daily   calcitRIOL  0.25 mcg Oral Daily   carvedilol  6.25 mg Oral BID WC   Chlorhexidine Gluconate Cloth  6 each Topical Q0600   furosemide  80 mg Intravenous BID   heparin  5,000 Units Subcutaneous Q8H   losartan  25 mg Oral Daily   mometasone-formoterol  2 puff Inhalation BID   rifampin  300 mg Oral BID   sodium bicarbonate  1,300 mg Oral TID   sodium chloride flush  3 mL Intravenous Q12H   Continuous Infusions:  sodium chloride     sodium chloride     PRN Meds:.sodium chloride, sodium chloride, acetaminophen **OR** acetaminophen, alteplase, diphenhydrAMINE, heparin, heparin, hydrALAZINE, HYDROmorphone (DILAUDID) injection, lidocaine (PF), lidocaine-prilocaine, pentafluoroprop-tetrafluoroeth  Antibiotics  :    Anti-infectives (From admission, onward)    Start     Dose/Rate Route Frequency Ordered Stop   09/03/21 1000  rifampin (RIFADIN) capsule 300 mg        300 mg Oral 2 times daily 09/02/21 2356          Time Spent in minutes  30   Lala Lund M.D on 09/03/2021 at 12:29 PM  To page go to [email or web address]   [facility] -  Office  [phone]  See all Orders from today for further details    Objective:   Vitals:   09/03/21 0715 09/03/21 0845 09/03/21 0900 09/03/21 1100  BP: (!) 170/138 (!) 178/113 (!) 168/100 (!) 162/99  Pulse: 93 72 89 89  Resp: 16 18 (!) 23 20  Temp:  98.5 F (36.9 C)  98.4 F (36.9 C)  TempSrc:  Oral  Oral  SpO2: 94% 100% 98% 98%  Weight:       Height:        Wt Readings from Last 3 Encounters:  09/02/21 79.8 kg  04/08/21 80.9 kg  02/23/20 79.2 kg     Intake/Output Summary (Last 24 hours) at 09/03/2021 1229 Last data filed at 09/03/2021 0327 Gross per 24 hour  Intake 3 ml  Output 300 ml  Net -297 ml     Physical Exam  Awake Alert, No new F.N deficits, Normal affect Linda [name].AT,PERRAL Supple Neck, No JVD,   Symmetrical Chest wall movement, Good air movement bilaterally, CTAB RRR,No Gallops,Rubs or new Murmurs,  +ve B.Sounds, Abd Soft, No tenderness,   No Cyanosis, Clubbing or edema      Data Review:    CBC Recent Labs  Lab 09/02/21 1322 09/03/21 0209  WBC 8.9 10.5  HGB 10.4* 10.0*  HCT 34.1* 31.9*  PLT 150 160  MCV 103.0* 101.9*  MCH 31.4 31.9  MCHC 30.5 31.3  RDW 14.3 14.1    Electrolytes Recent Labs  Lab 09/02/21 1322 09/03/21 0209  NA 135 135  K 4.4 3.9  CL 108 106  CO2 14* 16*  GLUCOSE 72 80  BUN 54* 53*  CREATININE 5.67* 5.46*  CALCIUM 8.6* 8.7*    ------------------------------------------------------------------------------------------------------------------ No results for input(s): CHOL, HDL, LDLCALC,  TRIG, CHOLHDL, LDLDIRECT in the last 72 hours.  Lab Results  Component Value Date   HGBA1C 4.6 (L) 04/09/2017    No results for input(s): TSH, T4TOTAL, T3FREE, THYROIDAB in the last 72 hours.  Invalid input(s): FREET3 ------------------------------------------------------------------------------------------------------------------ ID Labs Recent Labs  Lab 09/02/21 1322 09/03/21 0209  WBC 8.9 10.5  PLT 150 160  CREATININE 5.67* 5.46*   Cardiac Enzymes No results for input(s): CKMB, TROPONINI, MYOGLOBIN in the last 168 hours.  Invalid input(s): CK     Radiology Reports DG Chest 2 View  Result Date: 09/02/2021 CLINICAL DATA:  Chest pain.  Lupus EXAM: CHEST - 2 VIEW COMPARISON:  08/22/2018 FINDINGS: Cardiac enlargement. Interval development of vascular congestion and  mild interstitial edema compatible with fluid overload. Small bilateral effusions. Mild bibasilar atelectasis. Central line has been removed since the prior study IMPRESSION: Findings compatible with fluid overload with interstitial edema and small pleural effusions. Electronically Signed   By: Franchot Gallo M.D.   On: 09/02/2021 13:54

## 2021-09-03 NOTE — Progress Notes (Signed)
[facility] Progress Note   26 y.o. female with a past medical history of CKD 5, HTN, SLE, TMA, G6PD deficiency, MCTD, calcinosis cutis, LVH who presents to [facility] with chest pain and SOB. Found to have interstitial edema/volume overload on CXR. Historically was on HD from 07/2018 to 08/2019 given some improvement in renal function however her renal function has been gradually declining. And now w/ DOE, n/v, dysgeusia.  Typically has new skin lesions with lupus flares.   She currently undergoing transplant evaluation at [facility].  Her renal biopsy in the past did show TMA with fibrosis but no lupus nephritis. She has a history of being on eculizumab in 2019 but none since then it seems. Patient follows with Dr. Moshe Cipro, [facility]. LUE brachial artery to axillary vein AVG placed in July 2020 by Dr. Lucky Cowboy.    Assessment/ Plan:   CKD 5, now ESRD: Underlying etiology of her CKD related to TMA? vs undifferentiated connective tissue disease, lupus (biopsy 2019 with TMA and fibrosis, no evidence of lupus). Historically, had been on HD until 08/2019. Follows with Dr. Moshe Cipro @ [place]. -last known Cr on 07/30/2021 was 5.6 w/ a eGFR of 10 -at this junction, she seems to be having uremic symptoms along with volume overload unfortunately. I had another  lengthy discussion with the patient and her aunt about restarting dialysis which is indicated at this time. She is agreeable but very upset at having to be on dialysis permanently. -Will plan for iHD today -slow start protocol. Will need CLIP for outpatient HD placement; renal navigator contacted. Fortunately has a LUE AVG which seems to be patent on exam -given history of lupus, will go ahead and check a urine sediment but at this point with the advanced kidney disease the lupus is likely quiescent. -Continue to monitor daily Cr, Dose meds for GFR<15 -Monitor Daily I/Os, Daily weight  -Maintain MAP>65 for optimal renal perfusion.  -Avoid further  nephrotoxins including NSAIDS, Morphine.  Unless absolutely necessary, avoid CT with contrast and/or MRI with gadolinium.      SOB, vascular congestion - lasix $Remov'80mg'lUynhR$  IV BID and will monitor response. -had normal LV function on her exercise stress echo in 05/2021   Chest pain -per primary service   Hypertension: -resume home meds, IV diuresis as above   Metabolic acidosis secondary to CKD -starting nahco3 $RemoveBeforeDE'1300mg'[id number]$  TID, can d/c once stable on HD   Secondary hyperparathyroidism -resume renagel, low phos diet, PTH ordered   Anemia due to chronic kidney disease: -Transfuse for Hgb<7 g/dL -hgb relatively okay for now, will check an iron panel   Vascular access: -LUE AVG, placed 03/2019-seems to be patent on exam, left arm precautions    Subjective:   Has had poor appetite for awhile but now has nausea as well + dyspnea.   Objective:   BP (!) 168/121    Pulse 89    Temp 98.5 F (36.9 C) (Oral)    Resp (!) 23    Ht $R'5\' 6"'VZ$  (1.676 m)    Wt 79.8 kg    LMP 09/02/2021    SpO2 98%    BMI 28.41 kg/m   Intake/Output Summary (Last 24 hours) at 09/03/2021 1000 Last data filed at 09/03/2021 0327 Gross per 24 hour  Intake 3 ml  Output 300 ml  Net -297 ml   Weight change:   Physical Exam: GEN: NAD, A&Ox3, NCAT HEENT: No conjunctival pallor, EOMI NECK: Supple, no thyromegaly LUNGS: rales at bases CV: RRR, No M/R/G ABD: SNDNT +  BS  EXT: trace  lower extremity edema ACCESS: LUA AVG +bruit   Imaging: DG Chest 2 View  Result Date: 09/02/2021 CLINICAL DATA:  Chest pain.  Lupus EXAM: CHEST - 2 VIEW COMPARISON:  08/22/2018 FINDINGS: Cardiac enlargement. Interval development of vascular congestion and mild interstitial edema compatible with fluid overload. Small bilateral effusions. Mild bibasilar atelectasis. Central line has been removed since the prior study IMPRESSION: Findings compatible with fluid overload with interstitial edema and small pleural effusions. Electronically Signed   By: Franchot Gallo M.D.   On: 09/02/2021 13:54    Labs: BMET Recent Labs  Lab 09/02/21 1322 09/03/21 0209  NA 135 135  K 4.4 3.9  CL 108 106  CO2 14* 16*  GLUCOSE 72 80  BUN 54* 53*  CREATININE 5.67* 5.46*  CALCIUM 8.6* 8.7*   CBC Recent Labs  Lab 09/02/21 1322 09/03/21 0209  WBC 8.9 10.5  HGB 10.4* 10.0*  HCT 34.1* 31.9*  MCV 103.0* 101.9*  PLT 150 160    Medications:     amLODipine  10 mg Oral Daily   calcitRIOL  0.25 mcg Oral Daily   carvedilol  6.25 mg Oral BID WC   Chlorhexidine Gluconate Cloth  6 each Topical Q0600   furosemide  80 mg Intravenous BID   heparin  5,000 Units Subcutaneous Q8H   losartan  25 mg Oral Daily   mometasone-formoterol  2 puff Inhalation BID   rifampin  300 mg Oral BID   sodium bicarbonate  1,300 mg Oral TID   sodium chloride flush  3 mL Intravenous Q12H      Otelia Santee, MD 09/03/2021, 10:00 AM

## 2021-09-03 NOTE — ED Notes (Signed)
MD Opyd notified of b/p continuously elevated with systolic > 704 and diastolic > 888. RN to give AM b/p meds early once verified by pharm.

## 2021-09-03 NOTE — Progress Notes (Signed)
Contacted by Dr Augustin Coupe to assist with out-pt HD arrangements. Will attempt to meet with pt in the am to assist with this matter.  Melven Sartorius Renal Navigator [phone]

## 2021-09-03 NOTE — ED Notes (Signed)
Pt able to walk to RR with steady gait. Pt output charted. Consent for dialysis signed.

## 2021-09-04 DIAGNOSIS — N289 Disorder of kidney and ureter, unspecified: Secondary | ICD-10-CM | POA: Diagnosis not present

## 2021-09-04 DIAGNOSIS — E8770 Fluid overload, unspecified: Secondary | ICD-10-CM | POA: Diagnosis not present

## 2021-09-04 LAB — BASIC METABOLIC PANEL
Anion gap: 10 (ref 5–15)
BUN: 37 mg/dL — ABNORMAL HIGH (ref 6–20)
CO2: 23 mmol/L (ref 22–32)
Calcium: 8.9 mg/dL (ref 8.9–10.3)
Chloride: 102 mmol/L (ref 98–111)
Creatinine, Ser: 4.56 mg/dL — ABNORMAL HIGH (ref 0.44–1.00)
GFR, Estimated: 13 mL/min — ABNORMAL LOW (ref >60)
Glucose, Bld: 95 mg/dL (ref 70–99)
Potassium: 3.7 mmol/L (ref 3.5–5.1)
Sodium: 135 mmol/L (ref 135–145)

## 2021-09-04 LAB — CBC
HCT: 33.3 % — ABNORMAL LOW (ref 36.0–46.0)
Hemoglobin: 10.9 g/dL — ABNORMAL LOW (ref 12.0–15.0)
MCH: 31.8 pg (ref 26.0–34.0)
MCHC: 32.7 g/dL (ref 30.0–36.0)
MCV: 97.1 fL (ref 80.0–100.0)
Platelets: 154 10*3/uL (ref 150–400)
RBC: 3.43 MIL/uL — ABNORMAL LOW (ref 3.87–5.11)
RDW: 13.9 % (ref 11.5–15.5)
WBC: 8.7 10*3/uL (ref 4.0–10.5)
nRBC: 0 % (ref 0.0–0.2)

## 2021-09-04 LAB — MRSA NEXT GEN BY PCR, NASAL: MRSA by PCR Next Gen: NOT DETECTED

## 2021-09-04 LAB — HEPATITIS B SURFACE ANTIBODY, QUANTITATIVE: Hep B S AB Quant (Post): 17.8 m[IU]/mL (ref >9.9)

## 2021-09-04 LAB — PARATHYROID HORMONE, INTACT (NO CA): PTH: 408 pg/mL — ABNORMAL HIGH (ref 15–65)

## 2021-09-04 MED ORDER — HYDRALAZINE HCL 50 MG PO TABS
50.0000 mg | ORAL_TABLET | Freq: Three times a day (TID) | ORAL | Status: DC
  Administered 2021-09-04 – 2021-09-05 (×4): 50 mg via ORAL
  Filled 2021-09-04 (×5): qty 1

## 2021-09-04 NOTE — Progress Notes (Signed)
[facility] Progress Note   26 y.o. female with a past medical history of CKD 5, HTN, SLE, TMA, G6PD deficiency, MCTD, calcinosis cutis, LVH who presents to [facility] with chest pain and SOB. Found to have interstitial edema/volume overload on CXR. Historically was on HD from 07/2018 to 08/2019 given some improvement in renal function however her renal function has been gradually declining. And now w/ DOE, n/v, dysgeusia.  Typically has new skin lesions with lupus flares.   She currently undergoing transplant evaluation at [facility].  Her renal biopsy in the past did show TMA with fibrosis but no lupus nephritis. She has a history of being on eculizumab in 2019 but none since then it seems. Patient follows with Dr. Moshe Cipro, [facility]. LUE brachial artery to axillary vein AVG placed in July 2020 by Dr. Lucky Cowboy.    Assessment/ Plan:   CKD 5, now ESRD: Underlying etiology of her CKD related to TMA? vs undifferentiated connective tissue disease, lupus (biopsy 2019 with TMA and fibrosis, no evidence of lupus). Historically, had been on HD until 08/2019. Follows with Dr. Moshe Cipro @ [facility]. -last known Cr on 07/30/2021 was 5.6 w/ a eGFR of 10 -at this junction, she seems to be having uremic symptoms along with volume overload unfortunately. I had another  lengthy discussion with the patient and her aunt (bedside today).  She was very upset at having to be on dialysis permanently 1/4 but today she's much more composed and accepting. HD #1 1/4 1.5L net UF  -Will plan for iHD #2 today and #3 for Sat  CLIP for outpatient HD placement in process; appreciate renal navigator. Fortunately has a LUE AVG which seems to be patent on exam -given history of lupus, will go ahead and check a urine sediment but at this point with the advanced kidney disease the lupus is likely quiescent. -Continue to monitor daily Cr, Dose meds for GFR<15 -Monitor Daily I/Os, Daily weight  -Maintain MAP>65 for optimal renal  perfusion.  -Avoid further nephrotoxins including NSAIDS, Morphine.  Unless absolutely necessary, avoid CT with contrast and/or MRI with gadolinium.      SOB, vascular congestion - lasix $Remov'80mg'hxryuy$  IV BID and will monitor response. -had normal LV function on her exercise stress echo in 05/2021   Chest pain -per primary service   Hypertension: -resume home meds, IV diuresis as above   Metabolic acidosis secondary to CKD -starting nahco3 $RemoveBeforeDE'1300mg'yiqqZftNLqkAWcN$  TID, can d/c once stable on HD   Secondary hyperparathyroidism -resume renagel, low phos diet, PTH ordered   Anemia due to chronic kidney disease: -Transfuse for Hgb<7 g/dL -hgb relatively okay for now, will check an iron panel   Vascular access: -LUE AVG, placed 03/2019-seems to be patent on exam, left arm precautions    Subjective:   Has had poor appetite for awhile but now has nausea as well + dyspnea. Feeling much better today.   Objective:   BP (!) 166/115 (BP Location: Right Arm)    Pulse 74    Temp 98.6 F (37 C) (Oral)    Resp 20    Ht $R'5\' 6"'wC$  (1.676 m)    Wt 73.9 kg    LMP 09/02/2021    SpO2 100%    BMI 26.31 kg/m   Intake/Output Summary (Last 24 hours) at 09/04/2021 0917 Last data filed at 09/03/2021 2000 Gross per 24 hour  Intake 120 ml  Output 1500 ml  Net -1380 ml   Weight change: -5.897 kg  Physical Exam: GEN: NAD, A&Ox3, NCAT  HEENT: No conjunctival pallor, EOMI NECK: Supple, no thyromegaly LUNGS: rales at bases CV: RRR, No M/R/G ABD: SNDNT +BS  EXT: trace  lower extremity edema ACCESS: LUA AVG +bruit   Imaging: DG Chest 2 View  Result Date: 09/02/2021 CLINICAL DATA:  Chest pain.  Lupus EXAM: CHEST - 2 VIEW COMPARISON:  08/22/2018 FINDINGS: Cardiac enlargement. Interval development of vascular congestion and mild interstitial edema compatible with fluid overload. Small bilateral effusions. Mild bibasilar atelectasis. Central line has been removed since the prior study IMPRESSION: Findings compatible with fluid overload  with interstitial edema and small pleural effusions. Electronically Signed   By: Franchot Gallo M.D.   On: 09/02/2021 13:54    Labs: BMET Recent Labs  Lab 09/02/21 1322 09/03/21 0209 09/04/21 0211  NA 135 135 135  K 4.4 3.9 3.7  CL 108 106 102  CO2 14* 16* 23  GLUCOSE 72 80 95  BUN 54* 53* 37*  CREATININE 5.67* 5.46* 4.56*  CALCIUM 8.6* 8.7* 8.9   CBC Recent Labs  Lab 09/02/21 1322 09/03/21 0209 09/04/21 0211  WBC 8.9 10.5 8.7  HGB 10.4* 10.0* 10.9*  HCT 34.1* 31.9* 33.3*  MCV 103.0* 101.9* 97.1  PLT 150 160 154    Medications:     amLODipine  10 mg Oral Daily   calcitRIOL  0.25 mcg Oral Daily   carvedilol  12.5 mg Oral BID WC   furosemide  80 mg Intravenous BID   heparin  5,000 Units Subcutaneous Q8H   hydrALAZINE  50 mg Oral Q8H   losartan  25 mg Oral Daily   mometasone-formoterol  2 puff Inhalation BID   rifampin  300 mg Oral BID   sodium bicarbonate  1,300 mg Oral TID   sodium chloride flush  3 mL Intravenous Q12H      Otelia Santee, MD 09/04/2021, 9:17 AM

## 2021-09-04 NOTE — Progress Notes (Signed)
Met with pt at bedside to discuss out-pt HD arrangements. Pt has hx at [facility] and would like to return there for treatments. Referral made to [facility] admissions this morning. Pt states she plans to transport self to appointments. Will follow and assist as needed.  Melven Sartorius Renal Navigator [phone]

## 2021-09-04 NOTE — TOC Initial Note (Signed)
Transition of Care [facility]) - Initial/Assessment Note    Patient Details  Name: Linda [name] MRN: [id number] Date of Birth: December 04, 1995  [facility] CM/SW Contact:    Angelita Ingles, RN Phone Number:[phone]  09/04/2021, 3:34 PM  Clinical Narrative:                 TOC following for home health needs. CM unable to complete assessment because patient states that she doesn't feel good and doesn't want to talk. TOC needs currently noted. TOC will continue to follow.   Expected Discharge Plan: Home/Self Care Barriers to Discharge: Continued Medical Work up   Patient Goals and CMS Choice     Choice offered to / list presented to : NA  Expected Discharge Plan and Services Expected Discharge Plan: Home/Self Care     Post Acute Care Choice: NA Living arrangements for the past 2 months: Single Family Home                                      Prior Living Arrangements/Services Living arrangements for the past 2 months: Single Family Home Lives with:: Self Patient language and need for interpreter reviewed:: Yes        Need for Family Participation in Patient Care: Yes (Comment)   Current home services:  (n/a) Criminal Activity/Legal Involvement Pertinent to Current Situation/Hospitalization: No - Comment as needed  Activities of Daily Living      Permission Sought/Granted                  Emotional Assessment Appearance:: Appears older than stated age Attitude/Demeanor/Rapport: Unable to Assess Affect (typically observed): Unable to Assess   Alcohol / Substance Use: Not Applicable Psych Involvement: No (comment)  Admission diagnosis:  Acute pulmonary edema (HCC) [J81.0] Hypervolemia associated with renal insufficiency [E87.70, N28.9] Patient Active Problem List   Diagnosis Date Noted   Hypervolemia associated with renal insufficiency 09/08/3233   Metabolic acidosis 57/32/2025   Macrocytic anemia 09/02/2021   Elevated troponin  09/02/2021   Anxiety 11/03/2019   Strain of sternocleidomastoid muscle 11/03/2019   Difficulty with speech 11/03/2019   Prolonged QT interval 03/02/2019   Chronic heart failure with preserved ejection fraction (HFpEF) ([place]) 03/02/2019   ESRD on dialysis ([place]) 01/10/2019   Calciphylaxis 12/27/2018   Tobacco abuse 03/02/2018   Angioedema 01/02/2018   Tachycardia 01/02/2018   Chronic kidney disease, stage 5 ([place])    Grief reaction 08/19/2017   Right knee pain    Bilateral knee swelling 04/20/2017   Lupus erythematosus    Hypertension 12/11/2016   Mixed connective tissue disease ([place]) 02/11/2016   Implanon in place 01/19/2014   PCP:  Pcp, No Pharmacy:   [id number] - [place], [place] [address] Phone: [phone] Fax: [phone]     Social Determinants of Health (SDOH) Interventions    Readmission Risk Interventions No flowsheet data found.

## 2021-09-05 DIAGNOSIS — E8770 Fluid overload, unspecified: Secondary | ICD-10-CM | POA: Diagnosis not present

## 2021-09-05 DIAGNOSIS — N289 Disorder of kidney and ureter, unspecified: Secondary | ICD-10-CM | POA: Diagnosis not present

## 2021-09-05 LAB — BASIC METABOLIC PANEL
Anion gap: 14 (ref 5–15)
BUN: 19 mg/dL (ref 6–20)
CO2: 24 mmol/L (ref 22–32)
Calcium: 9 mg/dL (ref 8.9–10.3)
Chloride: 96 mmol/L — ABNORMAL LOW (ref 98–111)
Creatinine, Ser: 3.69 mg/dL — ABNORMAL HIGH (ref 0.44–1.00)
GFR, Estimated: 17 mL/min — ABNORMAL LOW (ref >60)
Glucose, Bld: 116 mg/dL — ABNORMAL HIGH (ref 70–99)
Potassium: 3.9 mmol/L (ref 3.5–5.1)
Sodium: 134 mmol/L — ABNORMAL LOW (ref 135–145)

## 2021-09-05 LAB — CBC
HCT: 37.9 % (ref 36.0–46.0)
Hemoglobin: 12.3 g/dL (ref 12.0–15.0)
MCH: 31.8 pg (ref 26.0–34.0)
MCHC: 32.5 g/dL (ref 30.0–36.0)
MCV: 97.9 fL (ref 80.0–100.0)
Platelets: 146 10*3/uL — ABNORMAL LOW (ref 150–400)
RBC: 3.87 MIL/uL (ref 3.87–5.11)
RDW: 14.2 % (ref 11.5–15.5)
WBC: 7.4 10*3/uL (ref 4.0–10.5)
nRBC: 0 % (ref 0.0–0.2)

## 2021-09-05 MED ORDER — FUROSEMIDE 80 MG PO TABS
80.0000 mg | ORAL_TABLET | Freq: Two times a day (BID) | ORAL | 0 refills | Status: DC
Start: 2021-09-05 — End: 2021-10-02

## 2021-09-05 MED ORDER — FUROSEMIDE 40 MG PO TABS: 80.0000 mg | ORAL_TABLET | Freq: Two times a day (BID) | ORAL | Status: DC

## 2021-09-05 MED ORDER — HYDRALAZINE HCL 50 MG PO TABS: 50.0000 mg | ORAL_TABLET | Freq: Three times a day (TID) | ORAL | 0 refills | Status: DC

## 2021-09-05 MED ORDER — SODIUM BICARBONATE 650 MG PO TABS
650.0000 mg | ORAL_TABLET | Freq: Two times a day (BID) | ORAL | Status: DC
  Administered 2021-09-05: 650 mg via ORAL
  Filled 2021-09-05: qty 1

## 2021-09-05 MED ORDER — SODIUM CHLORIDE 0.9 % IV SOLN: 100.0000 mg | INTRAVENOUS | Status: DC

## 2021-09-05 MED ORDER — SODIUM CHLORIDE 0.9 % IV SOLN: 250.0000 mg | INTRAVENOUS | Status: DC

## 2021-09-05 MED ORDER — SODIUM BICARBONATE 650 MG PO TABS: 650.0000 mg | ORAL_TABLET | Freq: Two times a day (BID) | ORAL | 0 refills | Status: DC

## 2021-09-05 MED ORDER — CARVEDILOL 12.5 MG PO TABS: 12.5000 mg | ORAL_TABLET | Freq: Two times a day (BID) | ORAL | 0 refills | Status: DC

## 2021-09-05 NOTE — Discharge Instructions (Signed)
Follow with Primary MD Pcp, No in 7 days   Get CBC, CMP, 2 view Chest X ray -  checked next visit within 1 week by Primary MD   Activity: As tolerated with Full fall precautions use walker/cane & assistance as needed  Disposition Home    Diet: Renal with 1.5lit/ day fluid restriction  Special Instructions: If you have smoked or chewed Tobacco  in the last 2 yrs please stop smoking, stop any regular Alcohol  and or any Recreational drug use.  On your next visit with your primary care physician please Get Medicines reviewed and adjusted.  Please request your Prim.MD to go over all Hospital Tests and Procedure/Radiological results at the follow up, please get all Hospital records sent to your Prim MD by signing hospital release before you go home.  If you experience worsening of your admission symptoms, develop shortness of breath, life threatening emergency, suicidal or homicidal thoughts you must seek medical attention immediately by calling 911 or calling your MD immediately  if symptoms less severe.  You Must read complete instructions/literature along with all the possible adverse reactions/side effects for all the Medicines you take and that have been prescribed to you. Take any new Medicines after you have completely understood and accpet all the possible adverse reactions/side effects.

## 2021-09-05 NOTE — Progress Notes (Signed)
PROGRESS NOTE                                                                                                                                                                                                             Patient Demographics:    Linda [name], is a 26 y.o. female, DOB - 1996/05/24, TMH:[id number]  Outpatient Primary MD for the patient is Pcp, No    LOS - 3  Admit date - 09/02/2021    Chief Complaint  Patient presents with   Chest Pain   Shortness of Breath       Brief Narrative (HPI from H&P)   Linda [name] is a very pleasant 26 y.o. female with medical history significant for SLE, mixed CTD, TMA, hypertension, latent TB, and CKD, now presenting with shortness of breath and chest discomfort, she was diagnosed with pulmonary edema due to fluid overload and admitted to the hospital.   Subjective:   Patient in bed, appears comfortable, denies any headache, no fever, no chest pain or pressure, no shortness of breath , no abdominal pain. No new focal weakness.    Assessment  & Plan :     Acute Hypoxic Resp. Failure due to cute pulmonary edema caused by fluid excess in the setting of ESRD - she had longstanding CKD 5 but has now progressed to ESRD, HD was started by nephrology, patient continues to feel that she does not require HD treatments, will defer management of this issue to nephrology, team has been informed by me updated Dr. Augustin Coupe on 09/05/2021.  2.  HTN.  On Norvasc, Coreg and losartan.  Coreg dose doubled, added as needed hydralazine added. HD for fluid removal.  3.  SLE.  Currently not on any active treatment follows with [facility].  4.  CKD 5 now progressing to ESRD due to lupus nephritis.  Plan as in #1 above.  5.  Latent TB.  On rifampin.  6.  AOCD.  Monitor         Condition - Extremely Guarded  Family Communication  : Sister bedside 09/04/2021  Code Status :   Full  Consults  :  Renal  PUD Prophylaxis :    Procedures  :            Disposition Plan  :    Status is:  Inpatient  Remains inpatient appropriate because: Pulm edema   DVT Prophylaxis  :    heparin injection 5,000 Units Start: 09/03/21 0600  Lab Results  Component Value Date   PLT 146 (L) 09/05/2021    Diet :  Diet Order             Diet renal with fluid restriction Fluid restriction: 1200 mL Fluid; Room service appropriate? Yes; Fluid consistency: Thin  Diet effective now                    Inpatient Medications  Scheduled Meds:  amLODipine  10 mg Oral Daily   calcitRIOL  0.25 mcg Oral Daily   carvedilol  12.5 mg Oral BID WC   furosemide  80 mg Intravenous BID   heparin  5,000 Units Subcutaneous Q8H   hydrALAZINE  50 mg Oral Q8H   losartan  25 mg Oral Daily   mometasone-formoterol  2 puff Inhalation BID   rifampin  300 mg Oral BID   sodium chloride flush  3 mL Intravenous Q12H   Continuous Infusions:  [START ON 09/06/2021] ferric gluconate (FERRLECIT) IVPB     PRN Meds:.acetaminophen **OR** acetaminophen, diphenhydrAMINE, hydrALAZINE, oxyCODONE  Antibiotics  :    Anti-infectives (From admission, onward)    Start     Dose/Rate Route Frequency Ordered Stop   09/03/21 1000  rifampin (RIFADIN) capsule 300 mg        300 mg Oral 2 times daily 09/02/21 2356          Time Spent in minutes  30   Lala Lund M.D on 09/05/2021 at 10:20 AM  To page go to [email or web address]   [facility] -  Office  [phone]  See all Orders from today for further details    Objective:   Vitals:   09/04/21 2137 09/05/21 0549 09/05/21 0700 09/05/21 0924  BP: 117/71 (!) 146/90 (!) 151/90 131/79  Pulse:  79 88   Resp:  19 18   Temp:  97.8 F (36.6 C) 98.5 F (36.9 C)   TempSrc:  Oral Oral   SpO2:  99%    Weight:  72.1 kg    Height:        Wt Readings from Last 3 Encounters:  09/05/21 72.1 kg  04/08/21 80.9 kg  02/23/20 79.2 kg      Intake/Output Summary (Last 24 hours) at 09/05/2021 1020 Last data filed at 09/04/2021 1425 Gross per 24 hour  Intake --  Output 2000 ml  Net -2000 ml     Physical Exam  Awake Alert, No new F.N deficits, Normal affect Wanda.AT,PERRAL Supple Neck, No JVD,   Symmetrical Chest wall movement, Good air movement bilaterally, CTAB RRR,No Gallops, Rubs or new Murmurs,  +ve B.Sounds, Abd Soft, No tenderness,   No Cyanosis, Clubbing or edema      Data Review:    CBC Recent Labs  Lab 09/02/21 1322 09/03/21 0209 09/04/21 0211 09/05/21 0130  WBC 8.9 10.5 8.7 7.4  HGB 10.4* 10.0* 10.9* 12.3  HCT 34.1* 31.9* 33.3* 37.9  PLT 150 160 154 146*  MCV 103.0* 101.9* 97.1 97.9  MCH 31.4 31.9 31.8 31.8  MCHC 30.5 31.3 32.7 32.5  RDW 14.3 14.1 13.9 14.2    Electrolytes Recent Labs  Lab 09/02/21 1322 09/03/21 0209 09/04/21 0211 09/05/21 0130  NA 135 135 135 134*  K 4.4 3.9 3.7 3.9  CL 108 106 102 96*  CO2 14* 16* 23 24  GLUCOSE 72 80  95 116*  BUN 54* 53* 37* 19  CREATININE 5.67* 5.46* 4.56* 3.69*  CALCIUM 8.6* 8.7* 8.9 9.0    ------------------------------------------------------------------------------------------------------------------ No results for input(s): CHOL, HDL, LDLCALC, TRIG, CHOLHDL, LDLDIRECT in the last 72 hours.  Lab Results  Component Value Date   HGBA1C 4.6 (L) 04/09/2017    No results for input(s): TSH, T4TOTAL, T3FREE, THYROIDAB in the last 72 hours.  Invalid input(s): FREET3 ------------------------------------------------------------------------------------------------------------------ ID Labs Recent Labs  Lab 09/02/21 1322 09/03/21 0209 09/04/21 0211 09/05/21 0130  WBC 8.9 10.5 8.7 7.4  PLT 150 160 154 146*  CREATININE 5.67* 5.46* 4.56* 3.69*   Cardiac Enzymes No results for input(s): CKMB, TROPONINI, MYOGLOBIN in the last 168 hours.  Invalid input(s): CK     Radiology Reports DG Chest 2 View  Result Date: 09/02/2021 CLINICAL  DATA:  Chest pain.  Lupus EXAM: CHEST - 2 VIEW COMPARISON:  08/22/2018 FINDINGS: Cardiac enlargement. Interval development of vascular congestion and mild interstitial edema compatible with fluid overload. Small bilateral effusions. Mild bibasilar atelectasis. Central line has been removed since the prior study IMPRESSION: Findings compatible with fluid overload with interstitial edema and small pleural effusions. Electronically Signed   By: Franchot Gallo M.D.   On: 09/02/2021 13:54

## 2021-09-05 NOTE — Progress Notes (Addendum)
Linda [name]   26 y.o. female with a past medical history of CKD 5, HTN, SLE, TMA, G6PD deficiency, MCTD, calcinosis cutis, LVH who presents to [facility] with chest pain and SOB. Found to have interstitial edema/volume overload on CXR. Historically was on HD from 07/2018 to 08/2019 given some improvement in renal function however her renal function has been gradually declining. And now w/ DOE, n/v, dysgeusia.  Typically has new skin lesions with lupus flares.   She currently undergoing [facility] evaluation at [facility].  Her renal biopsy in the past did show TMA with fibrosis but no lupus nephritis. She has a history of being on eculizumab in 2019 but none since then it seems. Patient follows with Dr. Moshe Cipro, [facility]. LUE brachial artery to axillary vein AVG placed in July 2020 by Dr. Lucky Cowboy.    Assessment/ Plan:   CKD 5, now ESRD: Underlying etiology of her CKD related to TMA? vs undifferentiated connective tissue disease, lupus (biopsy 2019 with TMA and fibrosis, no evidence of lupus). Historically, had been on HD until 08/2019. Follows with Dr. Moshe Cipro @ [facility] and would like to go back to [facility] -> appreciate renal navigator working on this. She was very upset at having to be on dialysis permanently 1/4; more accepting now. Fortunately has a LUE AVG which seems to be patent on exam  HD #1 1/4 1.5L net UF, HD #2 Thur 2L net and did not feel well afterwards.  She wants to try without HD  # will hold hd tomorrow and ok for d/c # will need HCO3 650 mg 1 tab BID # Furosemide 40mg  2tabs BID # stopped losartan given advanced CKD5 and trying to do without hd # Has f/u dr Moshe Cipro  1/12 120PM  -Continue to monitor daily Cr, Dose meds for GFR<15 -Monitor Daily I/Os, Daily weight  -Maintain MAP>65 for optimal renal perfusion.  -Avoid further nephrotoxins including NSAIDS, Morphine.  Unless absolutely necessary, avoid CT with contrast and/or MRI with gadolinium.      SOB,  vascular congestion - change to and d/c with lasix 80mg  PO BID. -had normal LV function on her exercise stress echo in 05/2021   Chest pain -per primary service   Hypertension: -resume home meds, IV diuresis as above   Metabolic acidosis secondary to CKD - nahco3 650mg  1 tab BID upon d/c   Secondary hyperparathyroidism -resume renagel, low phos diet, PTH ordered -> will request phos level w hd tomorrow   Anemia due to chronic kidney disease: -Transfuse for Hgb<7 g/dL -hgb relatively okay for now, iron panel -> 20% sat with F382 -> will give IV iron with HD (total of 10 treatments for total of 1gm)   Vascular access: -LUE AVG, placed 03/2019-seems to be patent on exam, left arm precautions    Subjective:   Appetite same but no further  nausea;  dyspnea resolved. Feeling better today but didn't feel well after hd yest.   Objective:   BP (!) 151/90 (BP Location: Right Arm)    Pulse 88    Temp 98.5 F (36.9 C) (Oral)    Resp 18    Ht 5\' 6"  (1.676 m)    Wt 72.1 kg    LMP 09/02/2021    SpO2 99%    BMI 25.65 kg/m   Intake/Output Summary (Last 24 hours) at 09/05/2021 0831 Last data filed at 09/04/2021 1425 Gross per 24 hour  Intake --  Output 2000 ml  Net -2000 ml   Weight  change: 0.264 kg  Physical Exam: GEN: NAD, A&Ox3, NCAT HEENT: No conjunctival pallor, EOMI NECK: Supple, no thyromegaly LUNGS: rales at bases CV: RRR, No M/R/G ABD: SNDNT +BS  EXT: trace  lower extremity edema ACCESS: LUA AVG +bruit   Imaging: No results found.  Labs: BMET Recent Labs  Lab 09/02/21 1322 09/03/21 0209 09/04/21 0211 09/05/21 0130  NA 135 135 135 134*  K 4.4 3.9 3.7 3.9  CL 108 106 102 96*  CO2 14* 16* 23 24  GLUCOSE 72 80 95 116*  BUN 54* 53* 37* 19  CREATININE 5.67* 5.46* 4.56* 3.69*  CALCIUM 8.6* 8.7* 8.9 9.0   CBC Recent Labs  Lab 09/02/21 1322 09/03/21 0209 09/04/21 0211 09/05/21 0130  WBC 8.9 10.5 8.7 7.4  HGB 10.4* 10.0* 10.9* 12.3  HCT 34.1* 31.9* 33.3* 37.9   MCV 103.0* 101.9* 97.1 97.9  PLT 150 160 154 146*    Medications:     amLODipine  10 mg Oral Daily   calcitRIOL  0.25 mcg Oral Daily   carvedilol  12.5 mg Oral BID WC   furosemide  80 mg Intravenous BID   heparin  5,000 Units Subcutaneous Q8H   hydrALAZINE  50 mg Oral Q8H   losartan  25 mg Oral Daily   mometasone-formoterol  2 puff Inhalation BID   rifampin  300 mg Oral BID   sodium bicarbonate  1,300 mg Oral TID   sodium chloride flush  3 mL Intravenous Q12H      Otelia Santee, MD 09/05/2021, 8:31 AM

## 2021-09-05 NOTE — Progress Notes (Signed)
PROGRESS NOTE                                                                                                                                                                                                             Patient Demographics:    Linda [name], is a 26 y.o. female, DOB - 1995-11-15, ZOX:[id number]  Outpatient Primary MD for the patient is Pcp, No    LOS - 3  Admit date - 09/02/2021    Chief Complaint  Patient presents with   Chest Pain   Shortness of Breath       Brief Narrative (HPI from H&P)   Linda [name] is a very pleasant 26 y.o. female with medical history significant for SLE, mixed CTD, TMA, hypertension, latent TB, and CKD, now presenting with shortness of breath and chest discomfort, she was diagnosed with pulmonary edema due to fluid overload and admitted to the hospital.   Subjective:   Patient in bed, appears comfortable, denies any headache, no fever, no chest pain or pressure, no shortness of breath , no abdominal pain. No focal weakness.   Assessment  & Plan :     Acute Hypoxic Resp. Failure due to cute pulmonary edema caused by fluid excess in the setting of ESRD - she had longstanding CKD 5 but has now progressed to ESRD, HD was started by nephrology, patient in somewhat of her denial and does not think she needs ongoing HD treatment, will defer this to nephrology, updated patient and sister bedside on 09/04/2021.  2.  HTN.  On Norvasc, Coreg and losartan.  Coreg dose doubled, added as needed hydralazine added. HD for fluid removal.  3.  SLE.  Currently not on any active treatment follows with [facility].  4.  CKD 5 now progressing to ESRD due to lupus nephritis.  Plan as in #1 above.  5.  Latent TB.  On rifampin.  6.  AOCD.  Monitor         Condition - Extremely Guarded  Family Communication  : Sister bedside 09/04/2021  Code Status :  Full  Consults  :   Renal  PUD Prophylaxis :    Procedures  :            Disposition Plan  :    Status is: Inpatient  Remains inpatient appropriate  because: Pulm edema   DVT Prophylaxis  :    heparin injection 5,000 Units Start: 09/03/21 0600  Lab Results  Component Value Date   PLT 146 (L) 09/05/2021    Diet :  Diet Order             Diet renal with fluid restriction Fluid restriction: 1200 mL Fluid; Room service appropriate? Yes; Fluid consistency: Thin  Diet effective now                    Inpatient Medications  Scheduled Meds:  amLODipine  10 mg Oral Daily   calcitRIOL  0.25 mcg Oral Daily   carvedilol  12.5 mg Oral BID WC   furosemide  80 mg Intravenous BID   heparin  5,000 Units Subcutaneous Q8H   hydrALAZINE  50 mg Oral Q8H   losartan  25 mg Oral Daily   mometasone-formoterol  2 puff Inhalation BID   rifampin  300 mg Oral BID   sodium chloride flush  3 mL Intravenous Q12H   Continuous Infusions:  [START ON 09/06/2021] ferric gluconate (FERRLECIT) IVPB     PRN Meds:.acetaminophen **OR** acetaminophen, diphenhydrAMINE, hydrALAZINE, oxyCODONE  Antibiotics  :    Anti-infectives (From admission, onward)    Start     Dose/Rate Route Frequency Ordered Stop   09/03/21 1000  rifampin (RIFADIN) capsule 300 mg        300 mg Oral 2 times daily 09/02/21 2356          Time Spent in minutes  30   Lala Lund M.D on 09/05/2021 at 10:18 AM  To page go to [email or web address]   [facility] -  Office  [phone]  See all Orders from today for further details    Objective:   Vitals:   09/04/21 2137 09/05/21 0549 09/05/21 0700 09/05/21 0924  BP: 117/71 (!) 146/90 (!) 151/90 131/79  Pulse:  79 88   Resp:  19 18   Temp:  97.8 F (36.6 C) 98.5 F (36.9 C)   TempSrc:  Oral Oral   SpO2:  99%    Weight:  72.1 kg    Height:        Wt Readings from Last 3 Encounters:  09/05/21 72.1 kg  04/08/21 80.9 kg  02/23/20 79.2 kg     Intake/Output Summary (Last  24 hours) at 09/05/2021 1018 Last data filed at 09/04/2021 1425 Gross per 24 hour  Intake --  Output 2000 ml  Net -2000 ml     Physical Exam  Awake Alert, No new F.N deficits, Normal affect [place].AT,PERRAL Supple Neck, No JVD,   Symmetrical Chest wall movement, Good air movement bilaterally, CTAB RRR,No Gallops, Rubs or new Murmurs,  +ve B.Sounds, Abd Soft, No tenderness,   No Cyanosis, Clubbing or edema       Data Review:    CBC Recent Labs  Lab 09/02/21 1322 09/03/21 0209 09/04/21 0211 09/05/21 0130  WBC 8.9 10.5 8.7 7.4  HGB 10.4* 10.0* 10.9* 12.3  HCT 34.1* 31.9* 33.3* 37.9  PLT 150 160 154 146*  MCV 103.0* 101.9* 97.1 97.9  MCH 31.4 31.9 31.8 31.8  MCHC 30.5 31.3 32.7 32.5  RDW 14.3 14.1 13.9 14.2    Electrolytes Recent Labs  Lab 09/02/21 1322 09/03/21 0209 09/04/21 0211 09/05/21 0130  NA 135 135 135 134*  K 4.4 3.9 3.7 3.9  CL 108 106 102 96*  CO2 14* 16* 23 24  GLUCOSE 72 80 95 116*  BUN  54* 53* 37* 19  CREATININE 5.67* 5.46* 4.56* 3.69*  CALCIUM 8.6* 8.7* 8.9 9.0    ------------------------------------------------------------------------------------------------------------------ No results for input(s): CHOL, HDL, LDLCALC, TRIG, CHOLHDL, LDLDIRECT in the last 72 hours.  Lab Results  Component Value Date   HGBA1C 4.6 (L) 04/09/2017    No results for input(s): TSH, T4TOTAL, T3FREE, THYROIDAB in the last 72 hours.  Invalid input(s): FREET3 ------------------------------------------------------------------------------------------------------------------ ID Labs Recent Labs  Lab 09/02/21 1322 09/03/21 0209 09/04/21 0211 09/05/21 0130  WBC 8.9 10.5 8.7 7.4  PLT 150 160 154 146*  CREATININE 5.67* 5.46* 4.56* 3.69*   Cardiac Enzymes No results for input(s): CKMB, TROPONINI, MYOGLOBIN in the last 168 hours.  Invalid input(s): CK     Radiology Reports DG Chest 2 View  Result Date: 09/02/2021 CLINICAL DATA:  Chest pain.  Lupus EXAM:  CHEST - 2 VIEW COMPARISON:  08/22/2018 FINDINGS: Cardiac enlargement. Interval development of vascular congestion and mild interstitial edema compatible with fluid overload. Small bilateral effusions. Mild bibasilar atelectasis. Central line has been removed since the prior study IMPRESSION: Findings compatible with fluid overload with interstitial edema and small pleural effusions. Electronically Signed   By: Franchot Gallo M.D.   On: 09/02/2021 13:54

## 2021-09-05 NOTE — Discharge Summary (Signed)
Linda [name]:[id number] DOB: 12/14/1995 DOA: 09/02/2021  PCP: Pcp, No  Admit date: 09/02/2021  Discharge date: 09/05/2021  Admitted From: Home   Disposition:  Home   Recommendations for Outpatient Follow-up:   Follow up with PCP in 1-2 weeks  PCP Please obtain BMP/CBC, 2 view CXR in 1week,  (see Discharge instructions)   PCP Please follow up on the following pending results:    Home Health: None   Equipment/Devices: None  Consultations: Renal Discharge Condition: Stable    CODE STATUS: Full    Diet Recommendation: Renal with 1.5 L fluid restriction per day.   Chief Complaint  Patient presents with   Chest Pain   Shortness of Breath     Brief history of present illness from the day of admission and additional interim summary    Linda [name] is a very pleasant 26 y.o. female with medical history significant for SLE, mixed CTD, TMA, hypertension, latent TB, and CKD, now presenting with shortness of breath and chest discomfort, she was diagnosed with pulmonary edema due to fluid overload and admitted to the hospital.                                                                 Hospital Course    Acute Hypoxic Resp. Failure due to cute pulmonary edema caused by fluid excess in the setting of ESRD - she had longstanding CKD 5 but has now progressed to ESRD, HD was started by nephrology, she underwent her first dialysis treatment on 09/04/2021 however symptoms are much better and nephrologist wants to hold off on further dialysis and try her on higher doses of diuretics to see how she does, she is relatively symptom-free will be discharged on Lasix 80 twice daily along with oral sodium bicarbonate with 1 week follow-up with her nephrologist Dr. Moshe Cipro.  Case discussed with Dr. Augustin Coupe.   2.  HTN.  On  Norvasc, I have doubled Coreg and added hydralazine, ARB discontinued.  PCP and nephrologist to monitor blood pressure and adjust as needed   3.  SLE.  Currently not on any active treatment follows with [facility].   4.  CKD 5 now progressing to ESRD due to lupus nephritis.  Plan as in #1 above.   5.  Latent TB.  On rifampin.   6.  AOCD.  Monitor   Discharge diagnosis     Principal Problem:   Hypervolemia associated with renal insufficiency Active Problems:   Hypertension   Chronic kidney disease, stage 5 (HCC)   Metabolic acidosis   Macrocytic anemia   Elevated troponin    Discharge instructions    Discharge Instructions     Discharge instructions   Complete by: As directed    Follow with Primary MD Pcp, No in 7  days   Get CBC, CMP, 2 view Chest X ray -  checked next visit within 1 week by Primary MD   Activity: As tolerated with Full fall precautions use walker/cane & assistance as needed  Disposition Home    Diet: Renal with 1.5lit/ day fluid restriction  Special Instructions: If you have smoked or chewed Tobacco  in the last 2 yrs please stop smoking, stop any regular Alcohol  and or any Recreational drug use.  On your next visit with your primary care physician please Get Medicines reviewed and adjusted.  Please request your Prim.MD to go over all Hospital Tests and Procedure/Radiological results at the follow up, please get all Hospital records sent to your Prim MD by signing hospital release before you go home.  If you experience worsening of your admission symptoms, develop shortness of breath, life threatening emergency, suicidal or homicidal thoughts you must seek medical attention immediately by calling 911 or calling your MD immediately  if symptoms less severe.  You Must read complete instructions/literature along with all the possible adverse reactions/side effects for all the Medicines you take and that have been prescribed to you. Take any  new Medicines after you have completely understood and accpet all the possible adverse reactions/side effects.   Increase activity slowly   Complete by: As directed        Discharge Medications   Allergies as of 09/05/2021       Reactions   Lisinopril Anaphylaxis   Angioedema   Ativan [lorazepam] Other (See Comments)   Somnolent with 1mg  ativan at [facility] Nov 2019   Vicodin [hydrocodone-acetaminophen] Itching, Rash        Medication List     STOP taking these medications    losartan 25 MG tablet Commonly known as: COZAAR       TAKE these medications    amLODipine 10 MG tablet Commonly known as: NORVASC Take 10 mg by mouth daily.   amoxicillin-clavulanate 875-125 MG tablet Commonly known as: Augmentin Take 1 tablet by mouth 2 (two) times daily. 1 tablet po BID x10 days   B-complex with vitamin C tablet Take 1 tablet by mouth daily.   busPIRone 7.5 MG tablet Commonly known as: BUSPAR Take 1 tablet (7.5 mg total) by mouth once as needed for up to 1 dose (1 hr before flight).   calcitRIOL 0.25 MCG capsule Commonly known as: ROCALTROL Take 0.25 mcg by mouth daily.   carvedilol 12.5 MG tablet Commonly known as: COREG Take 1 tablet (12.5 mg total) by mouth 2 (two) times daily with a meal. What changed:  medication strength how much to take   furosemide 80 MG tablet Commonly known as: LASIX Take 1 tablet (80 mg total) by mouth 2 (two) times daily. What changed: when to take this   hydrALAZINE 50 MG tablet Commonly known as: APRESOLINE Take 1 tablet (50 mg total) by mouth every 8 (eight) hours.   hydrOXYzine 25 MG tablet Commonly known as: ATARAX Take 1 tablet (25 mg total) by mouth every 6 (six) hours.   lidocaine 5 % ointment Commonly known as: XYLOCAINE Apply 1 application topically as needed.   medroxyPROGESTERone 150 MG/ML injection Commonly known as: DEPO-PROVERA Inject 1 mL (150 mg total) into the muscle every 3 (three) months.    pantoprazole 40 MG tablet Commonly known as: PROTONIX Take 1 tablet (40 mg total) by mouth 2 (two) times daily.   promethazine 25 MG tablet Commonly known as: PHENERGAN Take 1 tablet (25  mg total) by mouth every 6 (six) hours as needed for nausea.   rifampin 300 MG capsule Commonly known as: RIFADIN Take 300 mg by mouth 2 (two) times daily.   sodium bicarbonate 650 MG tablet Take 1 tablet (650 mg total) by mouth 2 (two) times daily.   Symbicort 160-4.5 MCG/ACT inhaler Generic drug: budesonide-formoterol Inhale 1 puff into the lungs daily as needed (For shortness of breath).   valACYclovir 1000 MG tablet Commonly known as: Valtrex Take 1 tablet twice a day for 3 days with each outbreak.         Follow-up Information     End, Harrell Gave, MD. Schedule an appointment as soon as possible for a visit in 1 week(s).   Specialty: Cardiology Contact information: [place] [address] [phone]                 Major procedures and Radiology Reports - PLEASE review detailed and final reports thoroughly  -      DG Chest 2 View  Result Date: 09/02/2021 CLINICAL DATA:  Chest pain.  Lupus EXAM: CHEST - 2 VIEW COMPARISON:  08/22/2018 FINDINGS: Cardiac enlargement. Interval development of vascular congestion and mild interstitial edema compatible with fluid overload. Small bilateral effusions. Mild bibasilar atelectasis. Central line has been removed since the prior study IMPRESSION: Findings compatible with fluid overload with interstitial edema and small pleural effusions. Electronically Signed   By: Franchot Gallo M.D.   On: 09/02/2021 13:54       Today   Subjective    Abia Najarian today has no headache,no chest abdominal pain,no new weakness tingling or numbness, feels much better wants to go home today.     Objective   Blood pressure (!) 147/119, pulse 88, temperature 98.5 F (36.9 C), temperature source Oral, resp. rate 18,  height 5\' 6"  (1.676 m), weight 72.1 kg, last menstrual period 09/02/2021, SpO2 99 %.   Intake/Output Summary (Last 24 hours) at 09/05/2021 1304 Last data filed at 09/04/2021 1425 Gross per 24 hour  Intake --  Output 2000 ml  Net -2000 ml    Exam  Awake Alert, No new F.N deficits, Normal affect [place].AT,PERRAL Supple Neck,No JVD, No cervical lymphadenopathy appriciated.  Symmetrical Chest wall movement, Good air movement bilaterally, CTAB RRR,No Gallops,Rubs or new Murmurs, No Parasternal Heave +ve B.Sounds, Abd Soft, Non tender, No organomegaly appriciated, No rebound -guarding or rigidity. No Cyanosis, Clubbing or edema, No new Rash or bruise   Data Review   CBC w Diff:  Lab Results  Component Value Date   WBC 7.4 09/05/2021   HGB 12.3 09/05/2021   HCT 37.9 09/05/2021   PLT 146 (L) 09/05/2021   LYMPHOPCT 25 03/27/2019   MONOPCT 10 03/27/2019   EOSPCT 4 03/27/2019   BASOPCT 1 03/27/2019    CMP:  Lab Results  Component Value Date   NA 134 (L) 09/05/2021   NA 139 06/08/2017   K 3.9 09/05/2021   CL 96 (L) 09/05/2021   CO2 24 09/05/2021   BUN 19 09/05/2021   BUN 12 06/08/2017   CREATININE 3.69 (H) 09/05/2021   PROT 7.6 08/22/2018   ALBUMIN 2.4 (L) 08/22/2018   BILITOT 0.5 08/22/2018   ALKPHOS 69 08/22/2018   AST 16 08/22/2018   ALT 6 08/22/2018  .   Total Time in preparing paper work, data evaluation and todays exam - 70 minutes  Lala Lund M.D on 09/05/2021 at 1:04 PM  [facility]

## 2021-09-05 NOTE — Care Management Important Message (Signed)
Important Message  Patient Details  Name: Linda [name] MRN: [id number] Date of Birth: [birth year]   Medicare Important Message Given:  Yes     Shelda Altes 09/05/2021, 11:17 AM

## 2021-09-05 NOTE — Progress Notes (Signed)
Case discussed with Dr Augustin Coupe. Referral for out-pt HD cancelled with [facility].   Melven Sartorius Renal Navigator [phone]

## 2021-09-18 ENCOUNTER — Ambulatory Visit: Payer: Medicare Other

## 2021-09-29 ENCOUNTER — Encounter (HOSPITAL_COMMUNITY): Payer: Self-pay | Admitting: Emergency Medicine

## 2021-09-29 ENCOUNTER — Emergency Department (HOSPITAL_COMMUNITY): Payer: Medicare Other

## 2021-09-29 ENCOUNTER — Inpatient Hospital Stay (HOSPITAL_COMMUNITY)
Admission: EM | Admit: 2021-09-29 | Discharge: 2021-10-02 | DRG: 291 | Disposition: A | Discharge status: Home / Self Care | Payer: Medicare Other | Attending: Internal Medicine | Admitting: Internal Medicine

## 2021-09-29 DIAGNOSIS — Z8249 Family history of ischemic heart disease and other diseases of the circulatory system: Secondary | ICD-10-CM | POA: Diagnosis not present

## 2021-09-29 DIAGNOSIS — Z79899 Other long term (current) drug therapy: Secondary | ICD-10-CM | POA: Diagnosis not present

## 2021-09-29 DIAGNOSIS — Z992 Dependence on renal dialysis: Secondary | ICD-10-CM

## 2021-09-29 DIAGNOSIS — Z20822 Contact with and (suspected) exposure to covid-19: Secondary | ICD-10-CM | POA: Diagnosis present

## 2021-09-29 DIAGNOSIS — D631 Anemia in chronic kidney disease: Secondary | ICD-10-CM | POA: Diagnosis present

## 2021-09-29 DIAGNOSIS — N186 End stage renal disease: Secondary | ICD-10-CM | POA: Diagnosis present

## 2021-09-29 DIAGNOSIS — Z841 Family history of disorders of kidney and ureter: Secondary | ICD-10-CM | POA: Diagnosis not present

## 2021-09-29 DIAGNOSIS — M329 Systemic lupus erythematosus, unspecified: Secondary | ICD-10-CM | POA: Diagnosis present

## 2021-09-29 DIAGNOSIS — E8779 Other fluid overload: Secondary | ICD-10-CM

## 2021-09-29 DIAGNOSIS — J810 Acute pulmonary edema: Secondary | ICD-10-CM | POA: Diagnosis not present

## 2021-09-29 DIAGNOSIS — R0602 Shortness of breath: Secondary | ICD-10-CM

## 2021-09-29 DIAGNOSIS — E8770 Fluid overload, unspecified: Secondary | ICD-10-CM | POA: Diagnosis not present

## 2021-09-29 DIAGNOSIS — Z888 Allergy status to other drugs, medicaments and biological substances status: Secondary | ICD-10-CM | POA: Diagnosis not present

## 2021-09-29 DIAGNOSIS — I5032 Chronic diastolic (congestive) heart failure: Secondary | ICD-10-CM | POA: Diagnosis not present

## 2021-09-29 DIAGNOSIS — L930 Discoid lupus erythematosus: Secondary | ICD-10-CM | POA: Insufficient documentation

## 2021-09-29 DIAGNOSIS — M311 Thrombotic microangiopathy, unspecified: Secondary | ICD-10-CM | POA: Diagnosis present

## 2021-09-29 DIAGNOSIS — I160 Hypertensive urgency: Secondary | ICD-10-CM | POA: Diagnosis present

## 2021-09-29 DIAGNOSIS — E8721 Acute metabolic acidosis: Secondary | ICD-10-CM | POA: Diagnosis not present

## 2021-09-29 DIAGNOSIS — R0902 Hypoxemia: Secondary | ICD-10-CM | POA: Diagnosis present

## 2021-09-29 DIAGNOSIS — K219 Gastro-esophageal reflux disease without esophagitis: Secondary | ICD-10-CM | POA: Diagnosis present

## 2021-09-29 DIAGNOSIS — Z833 Family history of diabetes mellitus: Secondary | ICD-10-CM

## 2021-09-29 DIAGNOSIS — F17290 Nicotine dependence, other tobacco product, uncomplicated: Secondary | ICD-10-CM | POA: Diagnosis present

## 2021-09-29 DIAGNOSIS — N2581 Secondary hyperparathyroidism of renal origin: Secondary | ICD-10-CM | POA: Diagnosis present

## 2021-09-29 DIAGNOSIS — Z7951 Long term (current) use of inhaled steroids: Secondary | ICD-10-CM | POA: Diagnosis not present

## 2021-09-29 DIAGNOSIS — M351 Other overlap syndromes: Secondary | ICD-10-CM | POA: Diagnosis present

## 2021-09-29 DIAGNOSIS — Z832 Family history of diseases of the blood and blood-forming organs and certain disorders involving the immune mechanism: Secondary | ICD-10-CM | POA: Diagnosis not present

## 2021-09-29 DIAGNOSIS — I132 Hypertensive heart and chronic kidney disease with heart failure and with stage 5 chronic kidney disease, or end stage renal disease: Principal | ICD-10-CM | POA: Diagnosis present

## 2021-09-29 DIAGNOSIS — I5033 Acute on chronic diastolic (congestive) heart failure: Secondary | ICD-10-CM | POA: Diagnosis present

## 2021-09-29 DIAGNOSIS — G43909 Migraine, unspecified, not intractable, without status migrainosus: Secondary | ICD-10-CM | POA: Diagnosis present

## 2021-09-29 DIAGNOSIS — Z885 Allergy status to narcotic agent status: Secondary | ICD-10-CM

## 2021-09-29 DIAGNOSIS — M3214 Glomerular disease in systemic lupus erythematosus: Secondary | ICD-10-CM | POA: Diagnosis present

## 2021-09-29 LAB — CBC
HCT: 35 % — ABNORMAL LOW (ref 36.0–46.0)
Hemoglobin: 11.4 g/dL — ABNORMAL LOW (ref 12.0–15.0)
MCH: 32 pg (ref 26.0–34.0)
MCHC: 32.6 g/dL (ref 30.0–36.0)
MCV: 98.3 fL (ref 80.0–100.0)
Platelets: 195 10*3/uL (ref 150–400)
RBC: 3.56 MIL/uL — ABNORMAL LOW (ref 3.87–5.11)
RDW: 12.8 % (ref 11.5–15.5)
WBC: 8.1 10*3/uL (ref 4.0–10.5)
nRBC: 0 % (ref 0.0–0.2)

## 2021-09-29 LAB — BRAIN NATRIURETIC PEPTIDE: B Natriuretic Peptide: >4500 pg/mL — ABNORMAL HIGH (ref 0.0–100.0)

## 2021-09-29 LAB — TROPONIN I (HIGH SENSITIVITY): Troponin I (High Sensitivity): 45 ng/L — ABNORMAL HIGH (ref <18)

## 2021-09-29 LAB — MAGNESIUM: Magnesium: 1.9 mg/dL (ref 1.7–2.4)

## 2021-09-29 LAB — BASIC METABOLIC PANEL
Anion gap: 14 (ref 5–15)
BUN: 63 mg/dL — ABNORMAL HIGH (ref 6–20)
CO2: 16 mmol/L — ABNORMAL LOW (ref 22–32)
Calcium: 8.5 mg/dL — ABNORMAL LOW (ref 8.9–10.3)
Chloride: 107 mmol/L (ref 98–111)
Creatinine, Ser: 6.46 mg/dL — ABNORMAL HIGH (ref 0.44–1.00)
GFR, Estimated: 8 mL/min — ABNORMAL LOW (ref >60)
Glucose, Bld: 80 mg/dL (ref 70–99)
Potassium: 4 mmol/L (ref 3.5–5.1)
Sodium: 137 mmol/L (ref 135–145)

## 2021-09-29 LAB — I-STAT BETA HCG BLOOD, ED (MC, WL, AP ONLY): I-stat hCG, quantitative: <5 m[IU]/mL (ref <5)

## 2021-09-29 MED ORDER — FUROSEMIDE 10 MG/ML IJ SOLN
80.0000 mg | Freq: Once | INTRAMUSCULAR | Status: AC
  Administered 2021-09-29: 80 mg via INTRAVENOUS
  Filled 2021-09-29: qty 8

## 2021-09-29 MED ORDER — HYDRALAZINE HCL 25 MG PO TABS
100.0000 mg | ORAL_TABLET | Freq: Once | ORAL | Status: AC
  Administered 2021-09-30: 100 mg via ORAL
  Filled 2021-09-29: qty 4

## 2021-09-29 NOTE — ED Provider Triage Note (Signed)
Emergency Medicine Provider Triage Evaluation Note  GERA INBODEN , a 26 y.o. female  was evaluated in triage.  Pt complains of shortness of breath and leg swelling for several days.  Patient has a history of mixed connective tissue disease, HFpEF, and chronic kidney disease.  She has been taking furosemide.  She does have history of pulmonary edema requiring IV Lasix.  She feels similarly now she did then.  Review of Systems  Positive: Shortness of breath, leg swelling, abdominal distention Negative: Fever, chills, chest pain  Physical Exam  BP (!) 201/152 (BP Location: Right Arm)    Pulse (!) 108    Temp 98 F (36.7 C)    Resp 17    LMP 09/02/2021    SpO2 98%  Gen:   Awake, no distress   Resp:  Normal effort  MSK:   Moves extremities without difficulty  Other:    Medical Decision Making  Medically screening exam initiated at 2:42 PM.  Appropriate orders placed.  CATRENA VARI was informed that the remainder of the evaluation will be completed by another provider, this initial triage assessment does not replace that evaluation, and the importance of remaining in the ED until their evaluation is complete.     Kashauna Celmer T, PA-C 09/29/21 1443

## 2021-09-29 NOTE — ED Triage Notes (Signed)
Patient with history of heart failure taking furosemide states she feels like fluid is accumulating in her lungs, history of pulmonary edema requiring additional doses of IV lasix. Patient alert, oriented, speaking in complete sentences, 98% on room air, and is in no apparent distress at this time.

## 2021-09-29 NOTE — ED Notes (Signed)
Shob while ambulating to restroom.

## 2021-09-29 NOTE — ED Provider Notes (Signed)
[facility] Emergency Department Provider Note MRN:  [id number]  Arrival date & time: 09/30/21     Chief Complaint   Shortness of Breath   History of Present Illness   Linda [name] is a 26 y.o. year-old female with a history of lupus, LVH, mixed connective tissue disorder and prior ESRD no longer on HD presenting to the ED with chief complaint of shortness of breath.  Patient states that since discharge from the hospital she has been on 80 mg of Lasix twice daily.  The patient states that she has continued to make urine however she has felt short of breath.  Patient also states that she has some midsternal chest pain without radiation and feels as if it is pressure.  Patient states that she has had no infectious symptoms, abdominal pain, leg swelling.  She was concerned because of the shortness of breath so she presented to the emergency department for further evaluation.  States that she has missed no doses of her medications.  Review of Systems  A thorough review of systems was obtained and all systems are negative except as noted in the HPI and PMH.   Patient's Health History    Past Medical History:  Diagnosis Date   Abnormal ECG    a. In setting of LVH w/ repolarization abnormalities; b. 03/2019 MV: No ischemia/infarct. EF 55-65%.   Angioedema    ESRD (end stage renal disease) ([place])    a. prev on HD.   GERD (gastroesophageal reflux disease)    Hypertension    Lupus (HCC)    LVH (left ventricular hypertrophy)    a. 11/2017 Echo: EF 50-55%. triv AI. Mild MR. Mod L and mild R atrial enlargement. Nl RV size/fxn. Small pericardial effusion w/o tampondae; b. 03/2018 Echo: EF 55-60%, restrictive filling pattern. Nl RV size/fxn. Sev LAE. Mild MR, mild to mod TR/PR. Mod PAH. Triv effusion.   Migraines    Mixed connective tissue disease ([place])    Previous Dialysis patient [facility])    a. Off HD since late 2020.   Septic prepatellar bursitis of right knee  08/19/2017    Past Surgical History:  Procedure Laterality Date   GRAFT APPLICATION Left 7/51/0258   Procedure: BRACHIAL CEPHALIC GRAFT CREATION;  Surgeon: Algernon Huxley, MD;  Location: [facility] ORS;  Service: Vascular;  Laterality: Left;   I & D EXTREMITY Right 08/06/2017   Procedure: IRRIGATION AND DEBRIDEMENT KNEE;  Surgeon: Shona Needles, MD;  Location: [place];  Service: Orthopedics;  Laterality: Right;   MULTIPLE TOOTH EXTRACTIONS      Family History  Problem Relation Age of Onset   Lupus Mother    Hypertension Mother    Kidney disease Mother    Heart Problems Mother    Coronary artery disease Mother 59       stent   Diabetes Maternal Grandmother     Social History   Socioeconomic History   Marital status: Single    Spouse name: Not on file   Number of children: Not on file   Years of education: Not on file   Highest education level: Not on file  Occupational History   Not on file  Tobacco Use   Smoking status: Every Day    Packs/day: 0.25    Years: 5.00    Pack years: 1.25    Types: E-cigarettes, Cigarettes    Start date: 02/11/2015   Smokeless tobacco: Never  Vaping Use   Vaping Use: Every day  Substance and Sexual Activity   Alcohol use: No    Alcohol/week: 0.0 standard drinks   Drug use: No   Sexual activity: Yes    Partners: Male    Birth control/protection: None  Other Topics Concern   Not on file  Social History Narrative   Lives with boyfriend   Social Determinants of Health   Financial Resource Strain: Not on file  Food Insecurity: Not on file  Transportation Needs: Not on file  Physical Activity: Not on file  Stress: Not on file  Social Connections: Not on file  Intimate Partner Violence: Not on file     Physical Exam   Physical Exam Constitutional:      Appearance: She is well-developed. She is not ill-appearing.  HENT:     Head: Normocephalic and atraumatic.     Right Ear: External ear normal.     Left Ear: External ear normal.      Nose: Nose normal.  Cardiovascular:     Rate and Rhythm: Normal rate and regular rhythm.     Pulses: Normal pulses.     Heart sounds: Normal heart sounds.  Pulmonary:     Effort: Pulmonary effort is normal.     Breath sounds: Examination of the right-middle field reveals rales. Examination of the left-middle field reveals rales. Examination of the right-lower field reveals rales. Examination of the left-lower field reveals rales. Rales present.  Chest:     Chest wall: No tenderness.  Abdominal:     General: Abdomen is flat.     Palpations: Abdomen is soft.  Musculoskeletal:     Cervical back: Neck supple.     Right lower leg: No edema.     Left lower leg: Edema present.  Skin:    General: Skin is warm and dry.     Capillary Refill: Capillary refill takes less than 2 seconds.  Neurological:     General: No focal deficit present.     Mental Status: She is alert and oriented to person, place, and time.      Diagnostic and Interventional Summary    EKG Interpretation  Date/Time:  Monday September 29 2021 21:23:23 EST Ventricular Rate:  90 PR Interval:  186 QRS Duration: 96 QT Interval:  427 QTC Calculation: 523 R Axis:   85 Text Interpretation: Sinus rhythm Biatrial enlargement Left ventricular hypertrophy Prolonged QT interval No significant change since last tracing Confirmed by Blanchie Dessert [phone]) on 09/29/2021 9:34:57 PM       Labs Reviewed  BASIC METABOLIC PANEL - Abnormal; Notable for the following components:      Result Value   CO2 16 (*)    BUN 63 (*)    Creatinine, Ser 6.46 (*)    Calcium 8.5 (*)    GFR, Estimated 8 (*)    All other components within normal limits  CBC - Abnormal; Notable for the following components:   RBC 3.56 (*)    Hemoglobin 11.4 (*)    HCT 35.0 (*)    All other components within normal limits  BRAIN NATRIURETIC PEPTIDE - Abnormal; Notable for the following components:   B Natriuretic Peptide >4,500.0 (*)    All other components  within normal limits  TROPONIN I (HIGH SENSITIVITY) - Abnormal; Notable for the following components:   Troponin I (High Sensitivity) 45 (*)    All other components within normal limits  MAGNESIUM  COMPREHENSIVE METABOLIC PANEL  MAGNESIUM  CBC WITH DIFFERENTIAL/PLATELET  I-STAT BETA HCG BLOOD, ED ([facility], [facility],  [facility] ONLY)  TROPONIN I (HIGH SENSITIVITY)    DG Chest 2 View  Final Result      Medications  oxyCODONE (Oxy IR/ROXICODONE) immediate release tablet 15 mg (has no administration in time range)  rifampin (RIFADIN) capsule 300 mg (has no administration in time range)  amLODipine (NORVASC) tablet 10 mg (has no administration in time range)  carvedilol (COREG) tablet 12.5 mg (has no administration in time range)  hydrALAZINE (APRESOLINE) tablet 50 mg (has no administration in time range)  calcitRIOL (ROCALTROL) capsule 0.25 mcg (has no administration in time range)  pantoprazole (PROTONIX) EC tablet 40 mg (has no administration in time range)  sodium bicarbonate tablet 650 mg (has no administration in time range)  fluticasone furoate-vilanterol (BREO ELLIPTA) 200-25 MCG/ACT 1 puff (has no administration in time range)  heparin injection 5,000 Units (has no administration in time range)  acetaminophen (TYLENOL) tablet 650 mg (has no administration in time range)    Or  acetaminophen (TYLENOL) suppository 650 mg (has no administration in time range)  polyethylene glycol (MIRALAX / GLYCOLAX) packet 17 g (has no administration in time range)  hydrALAZINE (APRESOLINE) injection 10 mg (has no administration in time range)  furosemide (LASIX) injection 80 mg (has no administration in time range)  furosemide (LASIX) injection 80 mg (80 mg Intravenous Given 09/29/21 2159)  hydrALAZINE (APRESOLINE) tablet 100 mg (100 mg Oral Given 09/30/21 0016)     Procedures  /  Critical Care Procedures  ED Course and Medical Decision Making  Initial Impression and Ddx 26 year old female presents to  emergency department for evaluation of shortness of breath.  Differential diagnosis includes resolving to the following: COPD exacerbation, heart failure exacerbation, renal failure  I have high suspicion that the patient is now fluid overloaded secondary to recrudescence of her renal failure.  Past medical/surgical history that increases complexity of ED encounter: Lupus nephritis CKD 5  Interpretation of Diagnostics I personally reviewed the EKG, Chest Xray, and Cardiac Monitor and my interpretation is as follows: EKG reviewed and was in normal sinus rhythm without acute ischemic changes.  Patient has a normal sinus rhythm on cardiac monitor.  Chest x-ray was suspicion for pulmonary edema.  Bedside ultrasound was performed which did not reveal pleural effusions.     The patient's lab work-up was significant for doubling in her creatinine from 3.6-6.5.  For this I have talked to the on-call nephrology attending who recommended 80 mg of IV Lasix currently admission to hospitalist service for further evaluation and possibly starting the patient back on dialysis.  The patient's BNP was greater than 4500  Patient Reassessment and Ultimate Disposition/Management The hospitalist team agreed to admit the patient to their service.  Please see their note for further details of the patient's care.  Patient management required discussion with the following services or consulting groups:  Hospitalist Service and Nephrology  Complexity of Problems Addressed Acute illness or injury that poses threat of life of bodily function  Additional Data Reviewed and Analyzed Further history obtained from: Recent discharge summary and Recent Consult notes  Factors Impacting ED Encounter Risk Consideration of hospitalization    Final Clinical Impressions(s) / ED Diagnoses     ICD-10-CM   1. SOB (shortness of breath)  R06.02          Zachery Dakins, MD 09/30/21 Jocelyn Lamer,  MD 10/01/21 [phone]

## 2021-09-30 ENCOUNTER — Other Ambulatory Visit: Payer: Self-pay

## 2021-09-30 ENCOUNTER — Encounter (HOSPITAL_COMMUNITY): Payer: Self-pay | Admitting: Internal Medicine

## 2021-09-30 ENCOUNTER — Inpatient Hospital Stay (HOSPITAL_COMMUNITY): Payer: Medicare Other

## 2021-09-30 DIAGNOSIS — I5032 Chronic diastolic (congestive) heart failure: Secondary | ICD-10-CM | POA: Diagnosis not present

## 2021-09-30 DIAGNOSIS — J810 Acute pulmonary edema: Secondary | ICD-10-CM | POA: Diagnosis present

## 2021-09-30 DIAGNOSIS — K219 Gastro-esophageal reflux disease without esophagitis: Secondary | ICD-10-CM | POA: Diagnosis present

## 2021-09-30 DIAGNOSIS — N186 End stage renal disease: Secondary | ICD-10-CM | POA: Diagnosis present

## 2021-09-30 DIAGNOSIS — E8770 Fluid overload, unspecified: Secondary | ICD-10-CM

## 2021-09-30 HISTORY — DX: Acute pulmonary edema: J81.0

## 2021-09-30 LAB — TROPONIN I (HIGH SENSITIVITY): Troponin I (High Sensitivity): 29 ng/L — ABNORMAL HIGH (ref <18)

## 2021-09-30 LAB — RESP PANEL BY RT-PCR (FLU A&B, COVID) ARPGX2
Influenza A by PCR: NEGATIVE
Influenza B by PCR: NEGATIVE
SARS Coronavirus 2 by RT PCR: NEGATIVE

## 2021-09-30 LAB — CBC WITH DIFFERENTIAL/PLATELET
Abs Immature Granulocytes: 0.02 10*3/uL (ref 0.00–0.07)
Basophils Absolute: 0.1 10*3/uL (ref 0.0–0.1)
Basophils Relative: 1 %
Eosinophils Absolute: 0.6 10*3/uL — ABNORMAL HIGH (ref 0.0–0.5)
Eosinophils Relative: 6 %
HCT: 31.6 % — ABNORMAL LOW (ref 36.0–46.0)
Hemoglobin: 9.9 g/dL — ABNORMAL LOW (ref 12.0–15.0)
Immature Granulocytes: 0 %
Lymphocytes Relative: 40 %
Lymphs Abs: 3.6 10*3/uL (ref 0.7–4.0)
MCH: 31.4 pg (ref 26.0–34.0)
MCHC: 31.3 g/dL (ref 30.0–36.0)
MCV: 100.3 fL — ABNORMAL HIGH (ref 80.0–100.0)
Monocytes Absolute: 1 10*3/uL (ref 0.1–1.0)
Monocytes Relative: 11 %
Neutro Abs: 3.7 10*3/uL (ref 1.7–7.7)
Neutrophils Relative %: 42 %
Platelets: 180 10*3/uL (ref 150–400)
RBC: 3.15 MIL/uL — ABNORMAL LOW (ref 3.87–5.11)
RDW: 12.9 % (ref 11.5–15.5)
WBC: 8.9 10*3/uL (ref 4.0–10.5)
nRBC: 0 % (ref 0.0–0.2)

## 2021-09-30 LAB — ECHOCARDIOGRAM COMPLETE
AR max vel: 2.9 cm2
AV Peak grad: 9.1 mmHg
Ao pk vel: 1.51 m/s
Area-P 1/2: 5.34 cm2
Height: 66 in
S' Lateral: 3.4 cm
Single Plane A4C EF: 40 %
Weight: 2560 oz

## 2021-09-30 LAB — IRON AND TIBC
Iron: 50 ug/dL (ref 28–170)
Saturation Ratios: 20 % (ref 10.4–31.8)
TIBC: 253 ug/dL (ref 250–450)
UIBC: 203 ug/dL

## 2021-09-30 LAB — MAGNESIUM: Magnesium: 2 mg/dL (ref 1.7–2.4)

## 2021-09-30 LAB — VITAMIN B12: Vitamin B-12: 785 pg/mL (ref 180–914)

## 2021-09-30 LAB — COMPREHENSIVE METABOLIC PANEL
ALT: 15 U/L (ref 0–44)
AST: 19 U/L (ref 15–41)
Albumin: 3.3 g/dL — ABNORMAL LOW (ref 3.5–5.0)
Alkaline Phosphatase: 74 U/L (ref 38–126)
Anion gap: 12 (ref 5–15)
BUN: 65 mg/dL — ABNORMAL HIGH (ref 6–20)
CO2: 17 mmol/L — ABNORMAL LOW (ref 22–32)
Calcium: 8.3 mg/dL — ABNORMAL LOW (ref 8.9–10.3)
Chloride: 109 mmol/L (ref 98–111)
Creatinine, Ser: 6.62 mg/dL — ABNORMAL HIGH (ref 0.44–1.00)
GFR, Estimated: 8 mL/min — ABNORMAL LOW (ref >60)
Glucose, Bld: 90 mg/dL (ref 70–99)
Potassium: 3.9 mmol/L (ref 3.5–5.1)
Sodium: 138 mmol/L (ref 135–145)
Total Bilirubin: 0.3 mg/dL (ref 0.3–1.2)
Total Protein: 7 g/dL (ref 6.5–8.1)

## 2021-09-30 LAB — PHOSPHORUS: Phosphorus: 3.8 mg/dL (ref 2.5–4.6)

## 2021-09-30 LAB — FERRITIN: Ferritin: 279 ng/mL (ref 11–307)

## 2021-09-30 MED ORDER — POLYETHYLENE GLYCOL 3350 17 G PO PACK: 17.0000 g | PACK | Freq: Every day | ORAL | Status: DC | PRN

## 2021-09-30 MED ORDER — PENTAFLUOROPROP-TETRAFLUOROETH EX AERO: 1.0000 "application " | INHALATION_SPRAY | CUTANEOUS | Status: DC | PRN

## 2021-09-30 MED ORDER — SODIUM BICARBONATE 650 MG PO TABS
650.0000 mg | ORAL_TABLET | Freq: Two times a day (BID) | ORAL | Status: DC
  Administered 2021-09-30 – 2021-10-02 (×6): 650 mg via ORAL
  Filled 2021-09-30 (×9): qty 1

## 2021-09-30 MED ORDER — ACETAMINOPHEN 650 MG RE SUPP: 650.0000 mg | Freq: Four times a day (QID) | RECTAL | Status: DC | PRN

## 2021-09-30 MED ORDER — HYDRALAZINE HCL 50 MG PO TABS
50.0000 mg | ORAL_TABLET | Freq: Three times a day (TID) | ORAL | Status: DC
  Administered 2021-09-30 – 2021-10-01 (×4): 50 mg via ORAL
  Filled 2021-09-30: qty 2
  Filled 2021-09-30 (×4): qty 1
  Filled 2021-09-30: qty 2

## 2021-09-30 MED ORDER — KIDNEY FAILURE BOOK: Freq: Once | Status: AC

## 2021-09-30 MED ORDER — ACETAMINOPHEN 325 MG PO TABS
650.0000 mg | ORAL_TABLET | Freq: Four times a day (QID) | ORAL | Status: DC | PRN
  Administered 2021-09-30 – 2021-10-01 (×5): 650 mg via ORAL
  Filled 2021-09-30 (×4): qty 2

## 2021-09-30 MED ORDER — HYDRALAZINE HCL 20 MG/ML IJ SOLN
10.0000 mg | Freq: Four times a day (QID) | INTRAMUSCULAR | Status: DC | PRN
  Administered 2021-09-30 – 2021-10-01 (×3): 10 mg via INTRAVENOUS
  Filled 2021-09-30 (×3): qty 1

## 2021-09-30 MED ORDER — CARVEDILOL 12.5 MG PO TABS
12.5000 mg | ORAL_TABLET | Freq: Two times a day (BID) | ORAL | Status: DC
  Administered 2021-09-30 – 2021-10-02 (×4): 12.5 mg via ORAL
  Filled 2021-09-30 (×4): qty 1

## 2021-09-30 MED ORDER — FLUTICASONE FUROATE-VILANTEROL 200-25 MCG/ACT IN AEPB
1.0000 | INHALATION_SPRAY | Freq: Every day | RESPIRATORY_TRACT | Status: DC
  Administered 2021-09-30: 1 via RESPIRATORY_TRACT
  Filled 2021-09-30: qty 28

## 2021-09-30 MED ORDER — FUROSEMIDE 10 MG/ML IJ SOLN
80.0000 mg | Freq: Two times a day (BID) | INTRAMUSCULAR | Status: DC
  Administered 2021-09-30 – 2021-10-01 (×3): 80 mg via INTRAVENOUS
  Filled 2021-09-30 (×3): qty 8

## 2021-09-30 MED ORDER — OXYCODONE HCL 5 MG PO TABS
15.0000 mg | ORAL_TABLET | Freq: Four times a day (QID) | ORAL | Status: DC | PRN
  Administered 2021-09-30 – 2021-10-01 (×4): 15 mg via ORAL
  Filled 2021-09-30 (×4): qty 3

## 2021-09-30 MED ORDER — LIDOCAINE HCL (PF) 1 % IJ SOLN: 5.0000 mL | INTRAMUSCULAR | Status: DC | PRN

## 2021-09-30 MED ORDER — CALCITRIOL 0.25 MCG PO CAPS
0.2500 ug | ORAL_CAPSULE | Freq: Every day | ORAL | Status: DC
  Administered 2021-09-30 – 2021-10-02 (×3): 0.25 ug via ORAL
  Filled 2021-09-30 (×3): qty 1

## 2021-09-30 MED ORDER — RIFAMPIN 300 MG PO CAPS
300.0000 mg | ORAL_CAPSULE | Freq: Two times a day (BID) | ORAL | Status: DC
  Administered 2021-09-30 – 2021-10-02 (×6): 300 mg via ORAL
  Filled 2021-09-30 (×6): qty 1

## 2021-09-30 MED ORDER — AMLODIPINE BESYLATE 10 MG PO TABS
10.0000 mg | ORAL_TABLET | Freq: Every day | ORAL | Status: DC
  Administered 2021-09-30 – 2021-10-02 (×3): 10 mg via ORAL
  Filled 2021-09-30: qty 1
  Filled 2021-09-30: qty 2
  Filled 2021-09-30: qty 1

## 2021-09-30 MED ORDER — LIDOCAINE-PRILOCAINE 2.5-2.5 % EX CREA: 1.0000 "application " | TOPICAL_CREAM | CUTANEOUS | Status: DC | PRN

## 2021-09-30 MED ORDER — PANTOPRAZOLE SODIUM 40 MG PO TBEC
40.0000 mg | DELAYED_RELEASE_TABLET | Freq: Every day | ORAL | Status: DC
  Administered 2021-09-30 – 2021-10-02 (×3): 40 mg via ORAL
  Filled 2021-09-30 (×3): qty 1

## 2021-09-30 MED ORDER — HEPARIN SODIUM (PORCINE) 5000 UNIT/ML IJ SOLN
5000.0000 [IU] | Freq: Three times a day (TID) | INTRAMUSCULAR | Status: DC
  Administered 2021-10-01: 5000 [IU] via SUBCUTANEOUS
  Filled 2021-09-30 (×4): qty 1

## 2021-09-30 NOTE — Progress Notes (Signed)
Echocardiogram 2D Echocardiogram has been performed.  Linda [name] 09/30/2021, 11:51 AM

## 2021-09-30 NOTE — Progress Notes (Signed)
Round on patient today while she was receiving hemodialysis treatment. Patient reports that she was on dialysis before and came off. She reports that she follows with Dr. Moshe Cipro outpatient and has plans for transplantation. Spoke with patient at length about the importance of adhering to outpatient treatment regimen and medications while she awaits transplantation. Patient also voiced concern as her blood pressure has been severely elevated since admission to the hospital even after taking oral blood pressure medications. Patient educated that if her blood pressure persist post hemodialysis to have her attending RN to reach out to the physician for next steps. Patient is agreeable. Patient given kidney failure book, handouts and contact information. Patient tolerating treatment well. Will follow as appropriate.  Dorthey Sawyer, RN  Dialysis Nurse Coordinator [phone]

## 2021-09-30 NOTE — Assessment & Plan Note (Signed)
Continuing home regimen of daily PPI therapy.  

## 2021-09-30 NOTE — Progress Notes (Signed)
Patient ID: Linda [name], female   DOB: December 19, 1995, 26 y.o.   MRN: [id number]  PROGRESS NOTE    Linda [name]  XVQ:[id number] DOB: June 28, 1996 DOA: 09/29/2021 PCP: Pcp, No   Brief Narrative:  26 year old female with history of SLE complicated by thrombotic microangiopathy resulting in chronic kidney disease stage V, discoid lupus with facial involvement, hypertension, latent TB, recent hospitalization from 09/02/2021-09/05/2021 for respiratory failure secondary to volume overload during which nephrology was of the opinion that the patient had progressed to ESRD for which patient received first dialysis treatment on 09/04/2021 but subsequently patient wanted to hold off on further dialysis and try higher doses of diuretics as an outpatient.  She presented with worsening shortness of breath.  On presentation, BNP was more than 4500, creatinine of 6.46 (up from 3.69 in early January).  Chest x-ray revealed mild bilateral interstitial pulmonary edema.  Nephrology was consulted.  Patient was given IV Lasix.  Assessment & Plan:   Volume overload/acute pulmonary edema Recent progression of CKD stage V to end-stage renal disease Acute metabolic acidosis -As per discussion above, patient received first dialysis treatment on 09/04/2021  but subsequently patient wanted to hold off on further dialysis and try higher doses of diuretics as an outpatient.  -Presented with worsening shortness of breath with BNP of more than 4500 and creatinine of 6.46, up from 3.69 in early January.  Chest x-ray showed pulmonary edema. -Has been started on IV Lasix as per nephrology recommendations. -Follow nephrology recommendations regarding timing of resumption of hemodialysis -Strict input and output.  Daily weights.  Fluid restriction. -echo pending  Hypertensive urgency -Possibly from above. -Blood pressure still extremely elevated.  Continue amlodipine, Coreg, Lasix, hydralazine.  Blood pressure will possibly improve  after dialysis  SLE -Currently not on any active treatment.  Outpatient follow-up with [facility]  Latent TB -Continue rifampin  Anemia of chronic disease -Hemoglobin stable.  Monitor  GERD -Continue PPI  DVT prophylaxis: Heparin subcutaneous Code Status: Full Family Communication: None at bedside Disposition Plan: Status is: Inpatient Remains inpatient appropriate because: Of severity of illness  Planned Discharge Destination: Home  Consultants: Neurology  Procedures: None  Antimicrobials: None   Subjective: Patient seen and examined at bedside.  Feels slightly better and making some urine.  Still short of breath with exertion.  Denies any current chest pain, nausea or vomiting.  Objective: Vitals:   09/30/21 1004 09/30/21 1005 09/30/21 1030 09/30/21 1045  BP: (!) 175/124  (!) 170/128 (!) 179/135  Pulse: 95  85   Resp: (!) 21 16 16 15   Temp: 98.2 F (36.8 C)     TempSrc: Oral     SpO2: 99%  100%   Weight:      Height:        Intake/Output Summary (Last 24 hours) at 09/30/2021 1104 Last data filed at 09/30/2021 0331 Gross per 24 hour  Intake --  Output 1000 ml  Net -1000 ml   Filed Weights   09/29/21 2114  Weight: 72.6 kg    Examination:  General exam: Appears calm and comfortable.  Looks chronically ill and deconditioned.  Currently on room air Respiratory system: Bilateral decreased breath sounds at bases scattered crackles Cardiovascular system: S1 & S2 heard, Rate controlled Gastrointestinal system: Abdomen is nondistended, soft and nontender. Normal bowel sounds heard. Extremities: No cyanosis, clubbing; trace lower extremity edema present Central nervous system: Alert and oriented. No focal neurological deficits. Moving extremities Skin: Notable scarring of the face.  No  other ecchymosis noted Psychiatry: Affect is mostly flat.  No signs of agitation.   Data Reviewed: I have personally reviewed following labs and imaging  studies  CBC: Recent Labs  Lab 09/29/21 1446 09/30/21 0250  WBC 8.1 8.9  NEUTROABS  --  3.7  HGB 11.4* 9.9*  HCT 35.0* 31.6*  MCV 98.3 100.3*  PLT 195 465   Basic Metabolic Panel: Recent Labs  Lab 09/29/21 1446 09/30/21 0250  NA 137 138  K 4.0 3.9  CL 107 109  CO2 16* 17*  GLUCOSE 80 90  BUN 63* 65*  CREATININE 6.46* 6.62*  CALCIUM 8.5* 8.3*  MG 1.9 2.0   GFR: Estimated Creatinine Clearance: 13.1 mL/min (A) (by C-G formula based on SCr of 6.62 mg/dL (H)). Liver Function Tests: Recent Labs  Lab 09/30/21 0250  AST 19  ALT 15  ALKPHOS 74  BILITOT 0.3  PROT 7.0  ALBUMIN 3.3*   No results for input(s): LIPASE, AMYLASE in the last 168 hours. No results for input(s): AMMONIA in the last 168 hours. Coagulation Profile: No results for input(s): INR, PROTIME in the last 168 hours. Cardiac Enzymes: No results for input(s): CKTOTAL, CKMB, CKMBINDEX, TROPONINI in the last 168 hours. BNP (last 3 results) No results for input(s): PROBNP in the last 8760 hours. HbA1C: No results for input(s): HGBA1C in the last 72 hours. CBG: No results for input(s): GLUCAP in the last 168 hours. Lipid Profile: No results for input(s): CHOL, HDL, LDLCALC, TRIG, CHOLHDL, LDLDIRECT in the last 72 hours. Thyroid Function Tests: No results for input(s): TSH, T4TOTAL, FREET4, T3FREE, THYROIDAB in the last 72 hours. Anemia Panel: No results for input(s): VITAMINB12, FOLATE, FERRITIN, TIBC, IRON, RETICCTPCT in the last 72 hours. Sepsis Labs: No results for input(s): PROCALCITON, LATICACIDVEN in the last 168 hours.  No results found for this or any previous visit (from the past 240 hour(s)).       Radiology Studies: DG Chest 2 View  Result Date: 09/29/2021 CLINICAL DATA:  Shortness of breath. History of heart failure feels like fluid accumulating in lungs. History of pulmonary edema. EXAM: CHEST - 2 VIEW COMPARISON:  Chest radiographs 09/02/2021 and 08/22/2018 FINDINGS: Cardiac  silhouette is again mildly to moderately enlarged. Mediastinal contours are within normal limits. There is again cephalization of the pulmonary vasculature. Mild bilateral mid and lower lung interstitial thickening is similar to prior. No pleural effusion. No pneumothorax. No acute skeletal abnormality. IMPRESSION: No significant change in mild-to-moderate enlargement of the cardiac silhouette and mild bilateral interstitial pulmonary edema. Electronically Signed   By: Yvonne Kendall M.D.   On: 09/29/2021 15:07        Scheduled Meds:  amLODipine  10 mg Oral Daily   calcitRIOL  0.25 mcg Oral Daily   carvedilol  12.5 mg Oral BID WC   fluticasone furoate-vilanterol  1 puff Inhalation Daily   furosemide  80 mg Intravenous BID   heparin  5,000 Units Subcutaneous Q8H   hydrALAZINE  50 mg Oral Q8H   pantoprazole  40 mg Oral Daily   rifampin  300 mg Oral BID   sodium bicarbonate  650 mg Oral BID   Continuous Infusions:        Aline August, MD [facility] 09/30/2021, 11:04 AM

## 2021-09-30 NOTE — ED Notes (Signed)
Pt to dialysis at this time, report given

## 2021-09-30 NOTE — Consult Note (Signed)
Nephrology Consult   Requesting provider: Aline August Service requesting consult: Hospitalist Reason for consult: ESRD   Assessment/Recommendations: Linda [name] is a/an 26 y.o. female with a past medical history HTN, SLE, TMA, LVH who present w/ SOB and symptoms consistent w/ ESRD   ESRD: Has now failed outpatient management.  Discussed safest plan is to likely start dialysis here in the hospital -Plan for dialysis today or tomorrow based on staffing -Has a functional AVG.  We will plan to use this -Request assistance from social work for CLIP  Hypertension: Likely will improve with volume status optimization.  Continue amlodipine, carvedilol, Lasix, hydralazine  Metabolic acidosis: Likely will improve with dialysis.  Continue sodium bicarbonate twice daily for now  Anemia of CKD: Likely multifactorial.  MCV somewhat high.  Will check B12 and iron studies.  Acute heart failure exacerbation: Mostly related to ESRD.  Diuretics as above  Secondary hyperparathyroidism: Continue home calcitriol.  Follow-up phosphorus     Recommendations conveyed to primary service.    [facility] 09/30/2021 10:24 AM   _____________________________________________________________________________________ CC: SOB  History of Present Illness: Linda [name] is a/an 26 y.o. female with a past medical history of HTN, SLE, TMA, LVH who presents with worsening shortness of breath.  Patient was recently hospitalized earlier this month for chest pain and shortness of breath.  She also had some symptoms of confusion and poor appetite.  She started on dialysis but wanted to try and leave the hospital to start dialysis outpatient.  She is undergoing transplant work-up at [facility].  She was doing fairly well with oral diuretics but over the past couple days her shortness of breath is gotten worse.  She also has noted significant elevation in her blood pressure as  well as headaches.  She described shortness of breath as moderate to severe and worse with exertion.  She did have some mild chest pain but nothing severe.  Denies any fevers, chills.  Mild cough present.  In the emergency department creatinine was found to be 6.6.  proBNP was undetectably high.  Chest x-ray did demonstrate some edema.  She was given IV Lasix overnight with some improvement in her symptoms.  Discussed plans with patient today and she understands that the safest thing is for her to start dialysis here in the hospital.   Medications:  Current Facility-Administered Medications  Medication Dose Route Frequency Provider Last Rate Last Admin   acetaminophen (TYLENOL) tablet 650 mg  650 mg Oral Q6H PRN Vernelle Emerald, MD   650 mg at 09/30/21 6301   Or   acetaminophen (TYLENOL) suppository 650 mg  650 mg Rectal Q6H PRN Vernelle Emerald, MD       amLODipine (NORVASC) tablet 10 mg  10 mg Oral Daily Shalhoub, Sherryll Burger, MD   10 mg at 09/30/21 0831   calcitRIOL (ROCALTROL) capsule 0.25 mcg  0.25 mcg Oral Daily Vernelle Emerald, MD   0.25 mcg at 09/30/21 0831   carvedilol (COREG) tablet 12.5 mg  12.5 mg Oral BID WC Shalhoub, Sherryll Burger, MD   12.5 mg at 09/30/21 0734   fluticasone furoate-vilanterol (BREO ELLIPTA) 200-25 MCG/ACT 1 puff  1 puff Inhalation Daily Shalhoub, Sherryll Burger, MD   1 puff at 09/30/21 0735   furosemide (LASIX) injection 80 mg  80 mg Intravenous BID Vernelle Emerald, MD   80 mg at 09/30/21 0734   heparin injection 5,000 Units  5,000 Units Subcutaneous Q8H Shalhoub, Sherryll Burger,  MD       hydrALAZINE (APRESOLINE) injection 10 mg  10 mg Intravenous Q6H PRN Shalhoub, Sherryll Burger, MD       hydrALAZINE (APRESOLINE) tablet 50 mg  50 mg Oral Q8H Shalhoub, Sherryll Burger, MD   50 mg at 09/30/21 4709   oxyCODONE (Oxy IR/ROXICODONE) immediate release tablet 15 mg  15 mg Oral QID PRN Vernelle Emerald, MD   15 mg at 09/30/21 0831   pantoprazole (PROTONIX) EC tablet 40 mg  40 mg Oral Daily  Shalhoub, Sherryll Burger, MD   40 mg at 09/30/21 0831   polyethylene glycol (MIRALAX / GLYCOLAX) packet 17 g  17 g Oral Daily PRN Vernelle Emerald, MD       rifampin (RIFADIN) capsule 300 mg  300 mg Oral BID Vernelle Emerald, MD   300 mg at 09/30/21 0831   sodium bicarbonate tablet 650 mg  650 mg Oral BID Vernelle Emerald, MD   650 mg at 09/30/21 0138   Current Outpatient Medications  Medication Sig Dispense Refill   amLODipine (NORVASC) 10 MG tablet Take 10 mg by mouth daily.     B Complex-C (B-COMPLEX WITH VITAMIN C) tablet Take 1 tablet by mouth daily.     calcitRIOL (ROCALTROL) 0.25 MCG capsule Take 0.25 mcg by mouth daily.     carvedilol (COREG) 12.5 MG tablet Take 1 tablet (12.5 mg total) by mouth 2 (two) times daily with a meal. 60 tablet 0   furosemide (LASIX) 80 MG tablet Take 1 tablet (80 mg total) by mouth 2 (two) times daily. 60 tablet 0   hydrALAZINE (APRESOLINE) 50 MG tablet Take 1 tablet (50 mg total) by mouth every 8 (eight) hours. 90 tablet 0   oxyCODONE (ROXICODONE) 15 MG immediate release tablet Take 15 mg by mouth 4 (four) times daily as needed for pain.     rifampin (RIFADIN) 300 MG capsule Take 300 mg by mouth 2 (two) times daily.     sodium bicarbonate 650 MG tablet Take 1 tablet (650 mg total) by mouth 2 (two) times daily. 60 tablet 0   SYMBICORT 160-4.5 MCG/ACT inhaler Inhale 1 puff into the lungs daily as needed (For shortness of breath).     amoxicillin-clavulanate (AUGMENTIN) 875-125 MG tablet Take 1 tablet by mouth 2 (two) times daily. 1 tablet po BID x10 days (Patient not taking: Reported on 09/02/2021) 14 tablet 2   busPIRone (BUSPAR) 7.5 MG tablet Take 1 tablet (7.5 mg total) by mouth once as needed for up to 1 dose (1 hr before flight). (Patient not taking: Reported on 02/23/2020) 2 tablet 0   hydrOXYzine (ATARAX/VISTARIL) 25 MG tablet Take 1 tablet (25 mg total) by mouth every 6 (six) hours. (Patient not taking: Reported on 02/23/2020) 12 tablet 0   lidocaine  (XYLOCAINE) 5 % ointment Apply 1 application topically as needed. (Patient not taking: Reported on 04/08/2021) 35.44 g 2   medroxyPROGESTERone (DEPO-PROVERA) 150 MG/ML injection Inject 1 mL (150 mg total) into the muscle every 3 (three) months. (Patient not taking: Reported on 09/02/2021) 1 mL 4   pantoprazole (PROTONIX) 40 MG tablet Take 1 tablet (40 mg total) by mouth 2 (two) times daily. (Patient not taking: Reported on 09/02/2021) 60 tablet 0   promethazine (PHENERGAN) 25 MG tablet Take 1 tablet (25 mg total) by mouth every 6 (six) hours as needed for nausea. (Patient not taking: Reported on 09/02/2021) 30 tablet 0   valACYclovir (VALTREX) 1000 MG tablet Take 1 tablet twice  a day for 3 days with each outbreak. (Patient not taking: Reported on 02/23/2020) 30 tablet 11     ALLERGIES Lisinopril, Ativan [lorazepam], and Vicodin [hydrocodone-acetaminophen]  MEDICAL HISTORY Past Medical History:  Diagnosis Date   Abnormal ECG    a. In setting of LVH w/ repolarization abnormalities; b. 03/2019 MV: No ischemia/infarct. EF 55-65%.   Angioedema    ESRD (end stage renal disease) ([place])    a. prev on HD.   GERD (gastroesophageal reflux disease)    Hypertension    Lupus ([facility])    LVH (left ventricular hypertrophy)    a. 11/2017 Echo: EF 50-55%. triv AI. Mild MR. Mod L and mild R atrial enlargement. Nl RV size/fxn. Small pericardial effusion w/o tampondae; b. 03/2018 Echo: EF 55-60%, restrictive filling pattern. Nl RV size/fxn. Sev LAE. Mild MR, mild to mod TR/PR. Mod PAH. Triv effusion.   Migraines    Mixed connective tissue disease ([place])    Previous Dialysis patient [facility])    a. Off HD since late 2020.   Septic prepatellar bursitis of right knee 08/19/2017     SOCIAL HISTORY Social History   Socioeconomic History   Marital status: Single    Spouse name: Not on file   Number of children: Not on file   Years of education: Not on file   Highest education level: Not on file  Occupational History   Not  on file  Tobacco Use   Smoking status: Every Day    Packs/day: 0.25    Years: 5.00    Pack years: 1.25    Types: E-cigarettes, Cigarettes    Start date: 02/11/2015   Smokeless tobacco: Never  Vaping Use   Vaping Use: Every day  Substance and Sexual Activity   Alcohol use: No    Alcohol/week: 0.0 standard drinks   Drug use: No   Sexual activity: Yes    Partners: Male    Birth control/protection: None  Other Topics Concern   Not on file  Social History Narrative   Lives with boyfriend   Social Determinants of Health   Financial Resource Strain: Not on file  Food Insecurity: Not on file  Transportation Needs: Not on file  Physical Activity: Not on file  Stress: Not on file  Social Connections: Not on file  Intimate Partner Violence: Not on file     FAMILY HISTORY Family History  Problem Relation Age of Onset   Lupus Mother    Hypertension Mother    Kidney disease Mother    Heart Problems Mother    Coronary artery disease Mother 11       stent   Diabetes Maternal Grandmother       Review of Systems: 12 systems reviewed Otherwise as per HPI, all other systems reviewed and negative  Physical Exam: Vitals:   09/30/21 1004 09/30/21 1005  BP: (!) 175/124   Pulse: 95   Resp: (!) 21 16  Temp: 98.2 F (36.8 C)   SpO2: 99%    No intake/output data recorded.  Intake/Output Summary (Last 24 hours) at 09/30/2021 1024 Last data filed at 09/30/2021 0331 Gross per 24 hour  Intake --  Output 1000 ml  Net -1000 ml   General: well-appearing, no acute distress HEENT: anicteric sclera, oropharynx clear without lesions CV: Normal rate, no audible murmur, 1+ pitting edema in the bilateral lower extremities Lungs: Minimal crackles bilaterally, normal work of breathing Abd: soft, non-tender, non-distended Skin: no visible lesions or rashes Psych: alert, engaged, appropriate mood  and affect Musculoskeletal: no obvious deformities Neuro: normal speech, no gross focal  deficits  Dialysis access: Left upper extremity AVG with bruit and thrill  Test Results Reviewed Lab Results  Component Value Date   NA 138 09/30/2021   K 3.9 09/30/2021   CL 109 09/30/2021   CO2 17 (L) 09/30/2021   BUN 65 (H) 09/30/2021   CREATININE 6.62 (H) 09/30/2021   CALCIUM 8.3 (L) 09/30/2021   ALBUMIN 3.3 (L) 09/30/2021     I have reviewed all relevant outside healthcare records related to the patient's current hospitalization

## 2021-09-30 NOTE — Assessment & Plan Note (Signed)
·   Patient presenting with progressively worsening shortness of breath, pulmonary edema and associated hypertensive urgency  Likely all secondary to declining renal function/end-stage renal disease  ER provider has discussed case with Dr. Royce Macadamia with nephrology, patient has been initiated on a trial regimen of Lasix 80 mg IV  We will continue to dose with 80 mg IV twice daily for now  Tentative plan is for patient to proceed with dialysis tomorrow  Strict input and output monitoring  Daily weights  Monitoring renal function and electrolytes with serial chemistries

## 2021-09-30 NOTE — Assessment & Plan Note (Signed)
·   Supplemental oxygen for bouts of hypoxia  Intravenous Lasix 80 mg twice daily as mentioned above  Echocardiogram in the morning  Dialysis likely to proceed in the morning as well

## 2021-09-30 NOTE — H&P (Signed)
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 Moshe Cipro), discoid lupus with facial involvement, hypertension, latent TB,  who presents to [facility] emergency department with complaints of increasing shortness of breath.  Of note, patient was recently hospitalized at [facility] from 1/3 until 1/6 for acute hypoxic respiratory failure with volume overload due to progression of chronic kidney disease.  Initially, patient was receiving dialysis however due to improving symptoms attempts were made to transition patient back off dialysis at time of discharge was sent home on 80 mg of oral Lasix twice daily with close outpatient follow-up with nephrology.  Patient now explains that since her discharge she has developed progressively worsening shortness of breath over the past several days to weeks.  Shortness of breath is moderate to severe in intensity, worse with exertion and improved with rest.  Patient also complains of some mild intermittent midsternal chest pressure without radiation.  Patient denies any cough, fevers, recent travel, sick contacts or contact with confirmed COVID-19 infection.  Patient reports compliance with all medications.  Due to progressively worsening symptoms patient eventually presented to [facility] emergency department for repeat evaluation.  Upon evaluation in the emergency department BNP was found to be markedly elevated at greater than 4500.  Creatinine had dramatically increased over the course the past 3 weeks and is currently at 6.46, up from 3.69 in early January.  Chest x-ray revealed mild bilateral  interstitial pulmonary edema.  ER provider discussed case with Dr. Royce Macadamia with nephrology who recommended administration of 80 mg of intravenous Lasix.  Recommendation was to hospitalize the patient for dialysis which will likely proceed in the morning.  The hospitalist group was then called to assess the patient for admission to the hospital.  Review of Systems:   Review of Systems  Respiratory:  Positive for shortness of breath.   Neurological:  Positive for weakness.  All other systems reviewed and are negative.  Past Medical History:  Diagnosis Date   Abnormal ECG    a. In setting of LVH w/ repolarization abnormalities; b. 03/2019 MV: No ischemia/infarct. EF 55-65%.   Angioedema    ESRD (end stage renal disease) ([place])    a. prev on HD.   GERD (gastroesophageal reflux disease)    Hypertension    Lupus (HCC)    LVH (left ventricular hypertrophy)    a. 11/2017 Echo: EF 50-55%. triv AI. Mild MR. Mod L and mild R atrial enlargement. Nl RV size/fxn. Small pericardial effusion w/o tampondae; b. 03/2018 Echo: EF 55-60%, restrictive filling pattern. Nl RV size/fxn. Sev LAE. Mild MR, mild to mod TR/PR. Mod PAH. Triv effusion.   Migraines    Mixed connective tissue disease ([place])    Previous Dialysis patient [facility])    a. Off HD since late 2020.   Septic prepatellar bursitis of right knee 08/19/2017    Past Surgical History:  Procedure Laterality Date   GRAFT APPLICATION Left 2/70/6237   Procedure: BRACHIAL CEPHALIC GRAFT CREATION;  Surgeon: Algernon Huxley, MD;  Location: [facility] ORS;  Service: Vascular;  Laterality: Left;   I & D EXTREMITY Right 08/06/2017   Procedure: IRRIGATION  AND DEBRIDEMENT KNEE;  Surgeon: Shona Needles, MD;  Location: [place];  Service: Orthopedics;  Laterality: Right;   MULTIPLE TOOTH EXTRACTIONS       reports that she has been smoking e-cigarettes. She started smoking about 6 years ago. She has a 1.25 pack-year smoking history. She has never used smokeless tobacco. She  reports that she does not drink alcohol and does not use drugs.  Allergies  Allergen Reactions   Lisinopril Anaphylaxis    Angioedema   Ativan [Lorazepam] Other (See Comments)    Somnolent with 1mg  ativan at [facility] Nov 2019   Vicodin [Hydrocodone-Acetaminophen] Itching and Rash    Family History  Problem Relation Age of Onset   Lupus Mother    Hypertension Mother    Kidney disease Mother    Heart Problems Mother    Coronary artery disease Mother 18       stent   Diabetes Maternal Grandmother      Prior to Admission medications   Medication Sig Start Date End Date Taking? Authorizing Provider  amLODipine (NORVASC) 10 MG tablet Take 10 mg by mouth daily. 08/05/21  Yes [provider]  B Complex-C (B-COMPLEX WITH VITAMIN C) tablet Take 1 tablet by mouth daily.   Yes [provider]  calcitRIOL (ROCALTROL) 0.25 MCG capsule Take 0.25 mcg by mouth daily. 08/01/21  Yes [provider]  carvedilol (COREG) 12.5 MG tablet Take 1 tablet (12.5 mg total) by mouth 2 (two) times daily with a meal. 09/05/21  Yes Thurnell Lose, MD  furosemide (LASIX) 80 MG tablet Take 1 tablet (80 mg total) by mouth 2 (two) times daily. 09/05/21  Yes Thurnell Lose, MD  hydrALAZINE (APRESOLINE) 50 MG tablet Take 1 tablet (50 mg total) by mouth every 8 (eight) hours. 09/05/21  Yes Thurnell Lose, MD  oxyCODONE (ROXICODONE) 15 MG immediate release tablet Take 15 mg by mouth 4 (four) times daily as needed for pain. 09/24/21  Yes [provider]  rifampin (RIFADIN) 300 MG capsule Take 300 mg by mouth 2 (two) times daily. 08/22/21  Yes [provider]  sodium bicarbonate 650 MG tablet Take 1 tablet (650 mg total) by mouth 2 (two) times daily. 09/05/21  Yes Thurnell Lose, MD  SYMBICORT 160-4.5 MCG/ACT inhaler Inhale 1 puff into the lungs daily as needed (For shortness of breath). 07/29/21  Yes [provider]  amoxicillin-clavulanate (AUGMENTIN) 875-125 MG  tablet Take 1 tablet by mouth 2 (two) times daily. 1 tablet po BID x10 days Patient not taking: Reported on 09/02/2021 04/08/21   Shelly Bombard, MD  busPIRone (BUSPAR) 7.5 MG tablet Take 1 tablet (7.5 mg total) by mouth once as needed for up to 1 dose (1 hr before flight). Patient not taking: Reported on 02/23/2020 11/03/19   Caroline More, DO  hydrOXYzine (ATARAX/VISTARIL) 25 MG tablet Take 1 tablet (25 mg total) by mouth every 6 (six) hours. Patient not taking: Reported on 02/23/2020 07/06/19   Shirley, [place], DO  lidocaine (XYLOCAINE) 5 % ointment Apply 1 application topically as needed. Patient not taking: Reported on 04/08/2021 11/25/20   Elvera Maria, CNM  medroxyPROGESTERone (DEPO-PROVERA) 150 MG/ML injection Inject 1 mL (150 mg total) into the muscle every 3 (three) months. Patient not taking: Reported on 09/02/2021 04/08/21   Shelly Bombard, MD  pantoprazole (PROTONIX) 40 MG tablet Take 1 tablet (40 mg total) by mouth 2 (two) times daily. Patient not taking: Reported on 09/02/2021 01/28/18 09/02/21  Alma Friendly, MD  promethazine (PHENERGAN) 25 MG tablet Take 1 tablet (25 mg total) by mouth every 6 (six) hours as needed for nausea. Patient not taking: Reported on 09/02/2021 07/14/18   Shelly Bombard, MD  valACYclovir (VALTREX) 1000 MG tablet Take 1 tablet twice a day for 3 days with each outbreak. Patient not taking: Reported on 02/23/2020 07/14/18   Shelly Bombard, MD    Physical Exam: Vitals:   09/29/21 2114 09/29/21 2123 09/29/21 2130 09/30/21 0015  BP:  (!) 191/136 (!) 175/134 (!) 190/147  Pulse:  91 85 90  Resp:  (!) 21 16 (!) 23  Temp:      TempSrc:      SpO2:  99% 98% 95%  Weight: 72.6 kg     Height: 5\' 6"  (1.676 m)       Constitutional: Awake alert and oriented x3, no associated distress.   Skin: Notable scarring of the face secondary to discoid lupus.  Otherwise, no rashes, no lesions, good skin turgor noted. Eyes: Pupils are equally reactive to light.  No  evidence of scleral icterus or conjunctival pallor.  ENMT: Moist mucous membranes noted.  Posterior pharynx clear of any exudate or lesions.   Neck: normal, supple, no masses, no thyromegaly.  No evidence of jugular venous distension.   Respiratory: Coarse breath sounds with scattered rhonchi bilaterally no wheezing, no crackles. Normal respiratory effort. No accessory muscle use.  Cardiovascular: Regular rate and rhythm, no murmurs / rubs / gallops. No extremity edema. 2+ pedal pulses. No carotid bruits.  Chest:   Nontender without crepitus or deformity.   Back:   Nontender without crepitus or deformity. Abdomen: Mild generalized abdominal tenderness.  Abdomen is soft.  No evidence of intra-abdominal masses.  Positive bowel sounds noted in all quadrants.   Musculoskeletal: No joint deformity upper and lower extremities. Good ROM, no contractures. Normal muscle tone.  Neurologic: CN 2-12 grossly intact. Sensation intact.  Patient moving all 4 extremities spontaneously.  Patient is following all commands.  Patient is responsive to verbal stimuli.   Psychiatric: Patient exhibits normal mood with appropriate affect.  Patient seems to possess insight as to their current situation.     Labs on Admission: I have personally reviewed following labs and imaging studies -   CBC: Recent Labs  Lab 09/29/21 1446  WBC 8.1  HGB 11.4*  HCT 35.0*  MCV 98.3  PLT 696   Basic Metabolic Panel: Recent Labs  Lab 09/29/21 1446  NA 137  K 4.0  CL 107  CO2 16*  GLUCOSE 80  BUN 63*  CREATININE 6.46*  CALCIUM 8.5*  MG 1.9   GFR: Estimated Creatinine Clearance: 13.5 mL/min (A) (by C-G formula based on SCr of 6.46 mg/dL (H)). Liver Function Tests: No results for input(s): AST, ALT, ALKPHOS, BILITOT, PROT, ALBUMIN in the last 168 hours. No results for input(s): LIPASE, AMYLASE in the last 168 hours. No results for input(s): AMMONIA in the last 168 hours. Coagulation Profile: No results for input(s):  INR, PROTIME in the last 168 hours. Cardiac Enzymes: No results for input(s): CKTOTAL, CKMB, CKMBINDEX, TROPONINI in the last 168 hours. BNP (last 3 results) No results for input(s): PROBNP in the last 8760 hours. HbA1C: No results for input(s): HGBA1C in the last 72 hours. CBG: No results for input(s): GLUCAP in the last 168 hours. Lipid Profile: No results for input(s): CHOL, HDL, LDLCALC, TRIG, CHOLHDL, LDLDIRECT in the last 72 hours. Thyroid Function Tests: No results for  input(s): TSH, T4TOTAL, FREET4, T3FREE, THYROIDAB in the last 72 hours. Anemia Panel: No results for input(s): VITAMINB12, FOLATE, FERRITIN, TIBC, IRON, RETICCTPCT in the last 72 hours. Urine analysis:    Component Value Date/Time   COLORURINE YELLOW 08/22/2018 [place] 08/22/2018 1207   LABSPEC 1.015 08/22/2018 1207   PHURINE 8.0 08/22/2018 [place] 08/22/2018 1207   HGBUR NEGATIVE 08/22/2018 [place] 08/22/2018 1207   BILIRUBINUR Negative 02/10/2018 1602   KETONESUR NEGATIVE 08/22/2018 1207   PROTEINUR 100 (A) 08/22/2018 1207   UROBILINOGEN 0.2 02/10/2018 1602   UROBILINOGEN 1.0 03/30/2015 2328   NITRITE NEGATIVE 08/22/2018 1207   LEUKOCYTESUR NEGATIVE 08/22/2018 1207    Radiological Exams on Admission - Personally Reviewed: DG Chest 2 View  Result Date: 09/29/2021 CLINICAL DATA:  Shortness of breath. History of heart failure feels like fluid accumulating in lungs. History of pulmonary edema. EXAM: CHEST - 2 VIEW COMPARISON:  Chest radiographs 09/02/2021 and 08/22/2018 FINDINGS: Cardiac silhouette is again mildly to moderately enlarged. Mediastinal contours are within normal limits. There is again cephalization of the pulmonary vasculature. Mild bilateral mid and lower lung interstitial thickening is similar to prior. No pleural effusion. No pneumothorax. No acute skeletal abnormality. IMPRESSION: No significant change in mild-to-moderate enlargement of  the cardiac silhouette and mild bilateral interstitial pulmonary edema. Electronically Signed   By: Yvonne Kendall M.D.   On: 09/29/2021 15:07    EKG: Personally reviewed.  Rhythm is normal sinus rhythm with heart rate of 90 bpm.  Evidence of left ventricular hypertrophy and prolonged QTC of 523.  No dynamic ST segment changes appreciated.  Assessment/Plan  Assessment and Plan: * Volume overload- (present on admission) Patient presenting with progressively worsening shortness of breath, pulmonary edema and associated hypertensive urgency Likely all secondary to declining renal function/end-stage renal disease ER provider has discussed case with Dr. Royce Macadamia with nephrology, patient has been initiated on a trial regimen of Lasix 80 mg IV We will continue to dose with 80 mg IV twice daily for now Tentative plan is for patient to proceed with dialysis tomorrow Strict input and output monitoring Daily weights Monitoring renal function and electrolytes with serial chemistries  Acute pulmonary edema ([place])- (present on admission) Supplemental oxygen for bouts of hypoxia Intravenous Lasix 80 mg twice daily as mentioned above Echocardiogram in the morning Dialysis likely to proceed in the morning as well  ESRD (end stage renal disease) ([place])- (present on admission) Advanced renal disease secondary to SLE causing thrombotic microangiopathy based on renal biopsy in the past Patient has required intermittent hemodialysis, most recently with a short course of dialysis in early January.   Declining renal function likely suggests that patient is progressed to end-stage renal disease and permanent hemodialysis Longstanding history of lupus with lupus nephritis Likely dialysis tomorrow as mentioned above Dr. Royce Macadamia with nephrology has been consulted and their input is appreciated.  Hypertensive urgency- (present on admission) Likely exacerbated by volume overload Resuming home regimen of oral  antihypertensive therapy As needed intravenous antihypertensives for markedly elevated blood pressure Blood pressure should dramatically improve once dialysis is initiated Obtaining echocardiogram  Systemic lupus erythematosus ([place])- (present on admission) Outpatient rheumatology follow-up  GERD without esophagitis- (present on admission) Continuing home regimen of daily PPI therapy.          Code Status:  Full code  code status decision has been confirmed with: patient Family Communication: deferred   Status is: Inpatient Remains inpatient appropriate because: Progressive  renal failure secondary to lupus related complications with concurrent hypertensive urgency and pulmonary edema requiring intravenous antihypertensives, close clinical monitoring on telemetry unit, initiation of inpatient dialysis and expert consultation.  Planned Discharge Destination: Home          Vernelle Emerald MD [facility] Pager [phone]  If 7PM-7AM, please contact night-coverage [email or web address] Use universal [place] password for that web site. If you do not have the password, please call the hospital operator.  09/30/2021, 12:34 AM

## 2021-09-30 NOTE — Assessment & Plan Note (Addendum)
·   Advanced renal disease secondary to SLE causing thrombotic microangiopathy based on renal biopsy in the past  Patient has required intermittent hemodialysis, most recently with a short course of dialysis in early January.    Declining renal function likely suggests that patient is progressed to end-stage renal disease and permanent hemodialysis  Longstanding history of lupus with lupus nephritis  Likely dialysis tomorrow as mentioned above  Dr. Royce Macadamia with nephrology has been consulted and their input is appreciated.

## 2021-09-30 NOTE — Assessment & Plan Note (Signed)
·   Outpatient rheumatology follow-up

## 2021-09-30 NOTE — Assessment & Plan Note (Addendum)
·   Likely exacerbated by volume overload  Resuming home regimen of oral antihypertensive therapy  As needed intravenous antihypertensives for markedly elevated blood pressure  Blood pressure should dramatically improve once dialysis is initiated  Obtaining echocardiogram

## 2021-09-30 NOTE — Progress Notes (Addendum)
Requested to see pt for out-pt HD at d/c. Pt is known to navigator from pt's previous admission. Pt has a hx at [facility] SW and would like to receive care there if possible. Will make referral to [facility] admissions today. Attempted to reach pt via phone but pt did not answer and voicemail is full. Will continue efforts to speak to pt. Will follow and assist.   Melven Sartorius Renal Navigator [phone]  Addendum at 3:47 pm: Met with pt at bedside while receiving HD. Pt confirms she prefers [facility] MWF if possible.

## 2021-09-30 NOTE — Progress Notes (Signed)
Pt admitted to the unit at 1635. Pt mental status is alert. Pt oriented to room, staff, and call bell. Skin is intact. Full assessment charted in CH[facility]. Call bell within reach. Visitor guidelines reviewed w/ pt and/or family.

## 2021-09-30 NOTE — ED Notes (Signed)
Breakfast orders placed 

## 2021-09-30 NOTE — Progress Notes (Signed)
Hemodialysis- Tolerated treatment well. Ran 2 hours and UF goal 1L met without issue. No issues with cannulation. Patient does complain of cronic headache. Medicated. Reported off to Linda [name].

## 2021-09-30 NOTE — Assessment & Plan Note (Signed)
·   Management as above, nephrology following

## 2021-10-01 DIAGNOSIS — F17210 Nicotine dependence, cigarettes, uncomplicated: Secondary | ICD-10-CM | POA: Insufficient documentation

## 2021-10-01 LAB — CBC WITH DIFFERENTIAL/PLATELET
Abs Immature Granulocytes: 0.01 10*3/uL (ref 0.00–0.07)
Basophils Absolute: 0 10*3/uL (ref 0.0–0.1)
Basophils Relative: 1 %
Eosinophils Absolute: 0.5 10*3/uL (ref 0.0–0.5)
Eosinophils Relative: 6 %
HCT: 33 % — ABNORMAL LOW (ref 36.0–46.0)
Hemoglobin: 10.9 g/dL — ABNORMAL LOW (ref 12.0–15.0)
Immature Granulocytes: 0 %
Lymphocytes Relative: 21 %
Lymphs Abs: 1.6 10*3/uL (ref 0.7–4.0)
MCH: 31.9 pg (ref 26.0–34.0)
MCHC: 33 g/dL (ref 30.0–36.0)
MCV: 96.5 fL (ref 80.0–100.0)
Monocytes Absolute: 1.1 10*3/uL — ABNORMAL HIGH (ref 0.1–1.0)
Monocytes Relative: 14 %
Neutro Abs: 4.4 10*3/uL (ref 1.7–7.7)
Neutrophils Relative %: 58 %
Platelets: 197 10*3/uL (ref 150–400)
RBC: 3.42 MIL/uL — ABNORMAL LOW (ref 3.87–5.11)
RDW: 12.9 % (ref 11.5–15.5)
WBC: 7.6 10*3/uL (ref 4.0–10.5)
nRBC: 0 % (ref 0.0–0.2)

## 2021-10-01 LAB — BASIC METABOLIC PANEL
Anion gap: 15 (ref 5–15)
BUN: 38 mg/dL — ABNORMAL HIGH (ref 6–20)
CO2: 19 mmol/L — ABNORMAL LOW (ref 22–32)
Calcium: 8.6 mg/dL — ABNORMAL LOW (ref 8.9–10.3)
Chloride: 101 mmol/L (ref 98–111)
Creatinine, Ser: 4.71 mg/dL — ABNORMAL HIGH (ref 0.44–1.00)
GFR, Estimated: 12 mL/min — ABNORMAL LOW (ref >60)
Glucose, Bld: 106 mg/dL — ABNORMAL HIGH (ref 70–99)
Potassium: 3.5 mmol/L (ref 3.5–5.1)
Sodium: 135 mmol/L (ref 135–145)

## 2021-10-01 MED ORDER — HYDRALAZINE HCL 50 MG PO TABS
100.0000 mg | ORAL_TABLET | Freq: Three times a day (TID) | ORAL | Status: DC
  Administered 2021-10-01 – 2021-10-02 (×2): 100 mg via ORAL
  Filled 2021-10-01 (×2): qty 2

## 2021-10-01 MED ORDER — NEPRO/CARBSTEADY PO LIQD
237.0000 mL | Freq: Two times a day (BID) | ORAL | Status: DC
  Administered 2021-10-02: 237 mL via ORAL

## 2021-10-01 MED ORDER — RENA-VITE PO TABS
1.0000 | ORAL_TABLET | Freq: Every day | ORAL | Status: DC
  Administered 2021-10-01: 1 via ORAL
  Filled 2021-10-01: qty 1

## 2021-10-01 MED ORDER — HYDRALAZINE HCL 50 MG PO TABS
50.0000 mg | ORAL_TABLET | Freq: Once | ORAL | Status: AC
  Administered 2021-10-01: 50 mg via ORAL
  Filled 2021-10-01: qty 1

## 2021-10-01 NOTE — Procedures (Signed)
I was present at this dialysis session. I have reviewed the session itself and made appropriate changes.   Filed Weights   09/30/21 1630 10/01/21 0836 10/01/21 1131  Weight: 73.6 kg 73.3 kg 71.7 kg    Recent Labs  Lab 09/30/21 1938 10/01/21 0424  NA  --  135  K  --  3.5  CL  --  101  CO2  --  19*  GLUCOSE  --  106*  BUN  --  38*  CREATININE  --  4.71*  CALCIUM  --  8.6*  PHOS 3.8  --     Recent Labs  Lab 09/29/21 1446 09/30/21 0250 10/01/21 0424  WBC 8.1 8.9 7.6  NEUTROABS  --  3.7 4.4  HGB 11.4* 9.9* 10.9*  HCT 35.0* 31.6* 33.0*  MCV 98.3 100.3* 96.5  PLT 195 180 197    Scheduled Meds:  amLODipine  10 mg Oral Daily   calcitRIOL  0.25 mcg Oral Daily   carvedilol  12.5 mg Oral BID WC   fluticasone furoate-vilanterol  1 puff Inhalation Daily   furosemide  80 mg Intravenous BID   heparin  5,000 Units Subcutaneous Q8H   hydrALAZINE  50 mg Oral Q8H   pantoprazole  40 mg Oral Daily   rifampin  300 mg Oral BID   sodium bicarbonate  650 mg Oral BID   Continuous Infusions: PRN Meds:.acetaminophen **OR** acetaminophen, hydrALAZINE, oxyCODONE, polyethylene glycol   Santiago Bumpers,  MD 10/01/2021, 12:18 PM

## 2021-10-01 NOTE — Progress Notes (Signed)
°  Transition of Care (TOC) Screening Note   Patient Details  Name: CLOTHILDE TIPPETTS Date of Birth: April 26, 1996   Transition of Care [facility]) CM/SW Contact:    Tom-Johnson, Renea Ee, RN Phone Number: 10/01/2021, 2:27 PM  Patient is from home with her grandmother. Admitted for Volume Overload. Patient states she is on disability and grandmother transports her to and from her appointments. Patient was on dialysis at [facility] before and to resume dialysis services at this facility at discharge. States her PCP was at [facility] but she is not going back. Would like followup and new patient appointment scheduled at her grandmother's PCP office. Patient's grandmother does not have information at this time but will get back with CM with contact information. Transition of Care [facility]) has reviewed patient and no recommendations noted at this time. TOC  will continue to monitor patient advancement through interdisciplinary progression rounds. If new patient transition needs arise, please place a TOC consult.

## 2021-10-01 NOTE — Progress Notes (Signed)
Pt has been approved for [facility] on MWF. Pt needs to arrive at 11:15 for first appt to complete paperwork for 12:00 chair time. Pt can start on Friday. Met with pt at bedside to discuss arrangements and schedule letter provided. Pt agreeable to plan. Arrangements added to AVS as well. Update provided to attending and nephrologist. Will assist as needed.   Melven Sartorius Renal Navigator [phone]

## 2021-10-01 NOTE — Progress Notes (Signed)
Nephrology Follow-Up Consult note   Assessment/Recommendations: Linda [name] is a/an 26 y.o. female with a past medical history significant for HTN, SLE, TMA, LVH who present w/ SOB and symptoms consistent w/ ESRD    ESRD: Has now failed outpatient management.   -HD #2 today then plan for next HD on Friday -Has a functional AVG working well -CLIP underway   Hypertension: Likely will improve with volume status optimization.  Continue amlodipine, carvedilol, Lasix, hydralazine.    Metabolic acidosis: Likely will improve with dialysis.  Continue sodium bicarbonate twice daily for now   Anemia of CKD: Likely multifactorial.  MCV somewhat high.  B12 normal. Iron acceptable. Hgb 10.9 today. CTM   Acute heart failure exacerbation: Mostly related to ESRD.  Improved at this time.   Secondary hyperparathyroidism: Continue home calcitriol.  Phos normal   Recommendations conveyed to primary service.    [facility] 10/01/2021 12:18 PM  ___________________________________________________________  CC: ESRD  Interval History/Subjective: Patient was seen and evaluated on hemodialysis.  Continues to have high blood pressure with headaches.   Medications:  Current Facility-Administered Medications  Medication Dose Route Frequency Provider Last Rate Last Admin   acetaminophen (TYLENOL) tablet 650 mg  650 mg Oral Q6H PRN Vernelle Emerald, MD   650 mg at 10/01/21 1147   Or   acetaminophen (TYLENOL) suppository 650 mg  650 mg Rectal Q6H PRN Vernelle Emerald, MD       amLODipine (NORVASC) tablet 10 mg  10 mg Oral Daily Shalhoub, Sherryll Burger, MD   10 mg at 09/30/21 0831   calcitRIOL (ROCALTROL) capsule 0.25 mcg  0.25 mcg Oral Daily Vernelle Emerald, MD   0.25 mcg at 09/30/21 0831   carvedilol (COREG) tablet 12.5 mg  12.5 mg Oral BID WC Shalhoub, Sherryll Burger, MD   12.5 mg at 09/30/21 1653   fluticasone furoate-vilanterol (BREO ELLIPTA) 200-25 MCG/ACT 1 puff  1  puff Inhalation Daily Shalhoub, Sherryll Burger, MD   1 puff at 09/30/21 0735   furosemide (LASIX) injection 80 mg  80 mg Intravenous BID Vernelle Emerald, MD   80 mg at 09/30/21 1838   heparin injection 5,000 Units  5,000 Units Subcutaneous Q8H Shalhoub, Sherryll Burger, MD       hydrALAZINE (APRESOLINE) injection 10 mg  10 mg Intravenous Q6H PRN Vernelle Emerald, MD   10 mg at 10/01/21 0150   hydrALAZINE (APRESOLINE) tablet 50 mg  50 mg Oral Q8H Shalhoub, Sherryll Burger, MD   50 mg at 10/01/21 5366   oxyCODONE (Oxy IR/ROXICODONE) immediate release tablet 15 mg  15 mg Oral QID PRN Vernelle Emerald, MD   15 mg at 09/30/21 1459   pantoprazole (PROTONIX) EC tablet 40 mg  40 mg Oral Daily Shalhoub, Sherryll Burger, MD   40 mg at 09/30/21 0831   polyethylene glycol (MIRALAX / GLYCOLAX) packet 17 g  17 g Oral Daily PRN Vernelle Emerald, MD       rifampin (RIFADIN) capsule 300 mg  300 mg Oral BID Vernelle Emerald, MD   300 mg at 09/30/21 2153   sodium bicarbonate tablet 650 mg  650 mg Oral BID Vernelle Emerald, MD   650 mg at 09/30/21 2153      Review of Systems: 10 systems reviewed and negative except per interval history/subjective  Physical Exam: Vitals:   10/01/21 1131 10/01/21 1157  BP: (!) 171/108 (!) 181/118  Pulse: 93 82  Resp:  16  Temp: 97.8 F (36.6 C) 97.9 F (36.6 C)  SpO2: 100% 91%   Total I/O In: -  Out: 1500 [Other:1500]  Intake/Output Summary (Last 24 hours) at 10/01/2021 1218 Last data filed at 10/01/2021 1113 Gross per 24 hour  Intake 580 ml  Output 2750 ml  Net -2170 ml   Constitutional: well-appearing, no acute distress ENMT: ears and nose without scars or lesions, MMM CV: normal rate, no edema Respiratory: bilateral chest rise, normal work of breathing Gastrointestinal: soft, non-tender, no palpable masses or hernias Psych: alert, judgement/insight appropriate, appropriate mood and affect   Test Results I personally reviewed new and old clinical labs and radiology  tests Lab Results  Component Value Date   NA 135 10/01/2021   K 3.5 10/01/2021   CL 101 10/01/2021   CO2 19 (L) 10/01/2021   BUN 38 (H) 10/01/2021   CREATININE 4.71 (H) 10/01/2021   CALCIUM 8.6 (L) 10/01/2021   ALBUMIN 3.3 (L) 09/30/2021   PHOS 3.8 09/30/2021

## 2021-10-01 NOTE — Progress Notes (Signed)
Initial Nutrition Assessment  DOCUMENTATION CODES:   Non-severe (moderate) malnutrition in context of chronic illness  INTERVENTION:  - Recommend diet liberalization from renal to a regular diet to encourage adequate nutritional intake; spoke with MD who agrees - Renal MVI with minerals daily - Nepro Shake po BID, each supplement provides 425 kcal and 19 grams protein (butter pecan)  NUTRITION DIAGNOSIS:   Moderate Malnutrition related to chronic illness (ESRD, lupus) as evidenced by energy intake < or equal to 75% for > or equal to 1 month, mild fat depletion, moderate muscle depletion.  GOAL:   Patient will meet greater than or equal to 90% of their needs  MONITOR:   PO intake, Supplement acceptance, Labs, Weight trends, I & O's  REASON FOR ASSESSMENT:   Malnutrition Screening Tool    ASSESSMENT:   Pt admitted with SOB secondary to volume overload. PMH includes ESRD, discoid lupus with facial involvement, HTN, and latent TB. Previous admission to [facility] 1/3-1/6 for acute hypoxic respiratory failure with volume overload d/t progression of CKD. Pt previously on dialysis however d/t improving symptoms was transitioned off and sent home on oral lasix.   Pt reports that she always has eaten smaller meals however within the past month she has been eating on average 1 meal a day. Her grandmother prepares meals and she reports eating foods such as roast, rice and gravy, baked BBQ chicken, green beans and corn. Observed outside food provided for pt on tray from [place] during visit. She reports that she has not eaten much during admission as she does not enjoy the foods provided. We discussed nutrition supplements and the importance of adequate nutrition. She does not like milk or milkshake type drinks but is agreeable to try a Nepro butter pecan flavor.   She does not know what her usual dry weight is but states that on 1/29 at her doctor's appointment she weighed 170 lbs. She states  that her current weight is 158 lbs. Unable to confirm this weight loss however, weight fluctuations can be attributed to volume status.   Medications: calcitriol, lasix, protonix, sodium bicarbonate  Labs: BUN 38, Cr 4.71, potassium 3.5 (WNL), Phos 3.8 (WNL)  I/O's: -3.2L since admission  HD Net UF (2/1): 1.5L  NUTRITION - FOCUSED PHYSICAL EXAM:  Flowsheet Row Most Recent Value  Orbital Region Mild depletion  Upper Arm Region No depletion  Thoracic and Lumbar Region Mild depletion  Buccal Region Moderate depletion  Temple Region Mild depletion  Clavicle Bone Region Severe depletion  Clavicle and Acromion Bone Region Moderate depletion  Scapular Bone Region Moderate depletion  Dorsal Hand Mild depletion  Patellar Region Moderate depletion  Anterior Thigh Region Moderate depletion  Posterior Calf Region Mild depletion  Edema (RD Assessment) Mild  Hair Reviewed  Eyes Reviewed  Mouth Reviewed  Skin Reviewed  Nails Unable to assess  [painted]       Diet Order:   Diet Order             Diet regular Room service appropriate? Yes; Fluid consistency: Thin; Fluid restriction: 1200 mL Fluid  Diet effective now                   EDUCATION NEEDS:   Education needs have been addressed  Skin:  Skin Assessment: Reviewed RN Assessment  Last BM:  1/29  Height:   Ht Readings from Last 1 Encounters:  09/30/21 5\' 6"  (1.676 m)    Weight:   Wt Readings from Last 1 Encounters:  10/01/21 71.7 kg    BMI:  Body mass index is 25.51 kg/m.  Estimated Nutritional Needs:   Kcal:  1900-2100  Protein:  95-110g  Fluid:  1L + UOP  Clayborne Dana, RDN, LDN Clinical Nutrition

## 2021-10-01 NOTE — Progress Notes (Signed)
PROGRESS NOTE    Linda [name]  IZT:[id number] DOB: 1996/04/29 DOA: 09/29/2021 PCP: Pcp, No    Brief Narrative:  26 year old female with history of SLE complicated by thrombotic microangiopathy resulting in chronic kidney disease stage V, discoid lupus with facial involvement, hypertension, latent TB, recent hospitalization from 09/02/2021-09/05/2021 for respiratory failure secondary to volume overload during which nephrology was of the opinion that the patient had progressed to ESRD for which patient received first dialysis treatment on 09/04/2021 but subsequently patient wanted to hold off on further dialysis and try higher doses of diuretics as an outpatient.  She presented with worsening shortness of breath.  On presentation, BNP was more than 4500, creatinine of 6.46 (up from 3.69 in early January).  Chest x-ray revealed mild bilateral interstitial pulmonary edema.  Nephrology was consulted.  Patient was given IV Lasix.    2/1 had dialysis this AM.  Complaining of headache.  Consultants:  Nephrology  Procedures: Hemodialysis this  Antimicrobials:     Subjective: C/o HA. No n/v/abd pain. Feels sob improving  Objective: Vitals:   10/01/21 0029 10/01/21 0221 10/01/21 0443 10/01/21 0803  BP: (!) 183/126 (!) 163/98 (!) 163/114 (!) 159/100  Pulse: 79 93 91 93  Resp: 19  17 18   Temp: 97.7 F (36.5 C)  98.1 F (36.7 C) 97.6 F (36.4 C)  TempSrc: Oral  Oral Oral  SpO2:  98% 100% 100%  Weight:      Height:        Intake/Output Summary (Last 24 hours) at 10/01/2021 0837 Last data filed at 10/01/2021 0600 Gross per 24 hour  Intake 580 ml  Output 1250 ml  Net -670 ml   Filed Weights   09/29/21 2114 09/30/21 1630  Weight: 72.6 kg 73.6 kg    Examination:  General exam: Appears calm and comfortable  Respiratory system: Clear to auscultation. Respiratory effort normal. Cardiovascular system: S1 & S2 heard, RRR. No JVD, murmurs, rubs, gallops or clicks.  Gastrointestinal  system: Abdomen is nondistended, soft and nontender. . Normal bowel sounds heard. Central nervous system: Alert and oriented. No focal neurological deficits. Extremities: No edema Psychiatry: Judgement and insight appear normal. Mood & affect appropriate.     Data Reviewed: I have personally reviewed following labs and imaging studies  CBC: Recent Labs  Lab 09/29/21 1446 09/30/21 0250 10/01/21 0424  WBC 8.1 8.9 7.6  NEUTROABS  --  3.7 4.4  HGB 11.4* 9.9* 10.9*  HCT 35.0* 31.6* 33.0*  MCV 98.3 100.3* 96.5  PLT 195 180 382   Basic Metabolic Panel: Recent Labs  Lab 09/29/21 1446 09/30/21 0250 09/30/21 1938 10/01/21 0424  NA 137 138  --  135  K 4.0 3.9  --  3.5  CL 107 109  --  101  CO2 16* 17*  --  19*  GLUCOSE 80 90  --  106*  BUN 63* 65*  --  38*  CREATININE 6.46* 6.62*  --  4.71*  CALCIUM 8.5* 8.3*  --  8.6*  MG 1.9 2.0  --   --   PHOS  --   --  3.8  --    GFR: Estimated Creatinine Clearance: 18.6 mL/min (A) (by C-G formula based on SCr of 4.71 mg/dL (H)). Liver Function Tests: Recent Labs  Lab 09/30/21 0250  AST 19  ALT 15  ALKPHOS 74  BILITOT 0.3  PROT 7.0  ALBUMIN 3.3*   No results for input(s): LIPASE, AMYLASE in the last 168 hours. No results for input(s):  AMMONIA in the last 168 hours. Coagulation Profile: No results for input(s): INR, PROTIME in the last 168 hours. Cardiac Enzymes: No results for input(s): CKTOTAL, CKMB, CKMBINDEX, TROPONINI in the last 168 hours. BNP (last 3 results) No results for input(s): PROBNP in the last 8760 hours. HbA1C: No results for input(s): HGBA1C in the last 72 hours. CBG: No results for input(s): GLUCAP in the last 168 hours. Lipid Profile: No results for input(s): CHOL, HDL, LDLCALC, TRIG, CHOLHDL, LDLDIRECT in the last 72 hours. Thyroid Function Tests: No results for input(s): TSH, T4TOTAL, FREET4, T3FREE, THYROIDAB in the last 72 hours. Anemia Panel: Recent Labs    09/30/21 1938  VITAMINB12 785   FERRITIN 279  TIBC 253  IRON 50   Sepsis Labs: No results for input(s): PROCALCITON, LATICACIDVEN in the last 168 hours.  Recent Results (from the past 240 hour(s))  Resp Panel by RT-PCR (Flu A&B, Covid) Nasopharyngeal Swab     Status: None   Collection Time: 09/30/21 10:54 AM   Specimen: Nasopharyngeal Swab; Nasopharyngeal(NP) swabs in vial transport medium  Result Value Ref Range Status   SARS Coronavirus 2 by RT PCR NEGATIVE NEGATIVE Final    Comment: (NOTE) SARS-CoV-2 target nucleic acids are NOT DETECTED.  The SARS-CoV-2 RNA is generally detectable in upper respiratory specimens during the acute phase of infection. The lowest concentration of SARS-CoV-2 viral copies this assay can detect is 138 copies/mL. A negative result does not preclude SARS-Cov-2 infection and should not be used as the sole basis for treatment or other patient management decisions. A negative result may occur with  improper specimen collection/handling, submission of specimen other than nasopharyngeal swab, presence of viral mutation(s) within the areas targeted by this assay, and inadequate number of viral copies(<138 copies/mL). A negative result must be combined with clinical observations, patient history, and epidemiological information. The expected result is Negative.  Fact Sheet for Patients:  EntrepreneurPulse.com.au  Fact Sheet for Healthcare Providers:  IncredibleEmployment.be  This test is no t yet approved or cleared by the [place] FDA and  has been authorized for detection and/or diagnosis of SARS-CoV-2 by FDA under an Emergency Use Authorization (EUA). This EUA will remain  in effect (meaning this test can be used) for the duration of the COVID-19 declaration under Section 564(b)(1) of the Act, 21 U.S.C.section 360bbb-3(b)(1), unless the authorization is terminated  or revoked sooner.       Influenza A by PCR NEGATIVE NEGATIVE Final    Influenza B by PCR NEGATIVE NEGATIVE Final    Comment: (NOTE) The Xpert Xpress SARS-CoV-2/FLU/RSV plus assay is intended as an aid in the diagnosis of influenza from Nasopharyngeal swab specimens and should not be used as a sole basis for treatment. Nasal washings and aspirates are unacceptable for Xpert Xpress SARS-CoV-2/FLU/RSV testing.  Fact Sheet for Patients: EntrepreneurPulse.com.au  Fact Sheet for Healthcare Providers: IncredibleEmployment.be  This test is not yet approved or cleared by the [place] FDA and has been authorized for detection and/or diagnosis of SARS-CoV-2 by FDA under an Emergency Use Authorization (EUA). This EUA will remain in effect (meaning this test can be used) for the duration of the COVID-19 declaration under Section 564(b)(1) of the Act, 21 U.S.C. section 360bbb-3(b)(1), unless the authorization is terminated or revoked.  Performed at [facility], [place] [address], [place], [place] [address]          Radiology Studies: DG Chest 2 View  Result Date: 09/29/2021 CLINICAL DATA:  Shortness of breath. History of  heart failure feels like fluid accumulating in lungs. History of pulmonary edema. EXAM: CHEST - 2 VIEW COMPARISON:  Chest radiographs 09/02/2021 and 08/22/2018 FINDINGS: Cardiac silhouette is again mildly to moderately enlarged. Mediastinal contours are within normal limits. There is again cephalization of the pulmonary vasculature. Mild bilateral mid and lower lung interstitial thickening is similar to prior. No pleural effusion. No pneumothorax. No acute skeletal abnormality. IMPRESSION: No significant change in mild-to-moderate enlargement of the cardiac silhouette and mild bilateral interstitial pulmonary edema. Electronically Signed   By: Yvonne Kendall M.D.   On: 09/29/2021 15:07   ECHOCARDIOGRAM COMPLETE  Result Date: 09/30/2021    ECHOCARDIOGRAM REPORT   Patient Name:   Linda [name] Date  of Exam: 09/30/2021 Medical Rec #:  [id number]         Height:       66.0 in Accession #:    [id number]        Weight:       160.0 lb Date of Birth:  1996-05-10         BSA:          1.819 m Patient Age:    25 years          BP:           179/135 mmHg Patient Gender: F                 HR:           75 bpm. Exam Location:  Inpatient Procedure: 2D Echo Indications:    CHF  History:        Patient has prior history of Echocardiogram examinations, most                 recent 11/30/2017. Risk Factors:Hypertension.  Sonographer:    Jefferey Pica Referring Phys: [id number] [place]  1. Left ventricular ejection fraction, by estimation, is 45 to 50%. The left ventricle has mildly decreased function. The left ventricle has no regional wall motion abnormalities. There is severe left ventricular hypertrophy of the inferior segment. Left ventricular diastolic parameters are consistent with Grade II diastolic dysfunction (pseudonormalization).  2. Right ventricular systolic function is normal. The right ventricular size is normal. There is moderately elevated pulmonary artery systolic pressure. The estimated right ventricular systolic pressure is 96.7 mmHg.  3. Left atrial size was severely dilated.  4. The pericardial effusion is localized near the right atrium.  5. The mitral valve is normal in structure. Mild mitral valve regurgitation. No evidence of mitral stenosis.  6. The aortic valve is normal in structure. Aortic valve regurgitation is mild. No aortic stenosis is present.  7. The inferior vena cava is dilated in size with <50% respiratory variability, suggesting right atrial pressure of 15 mmHg. FINDINGS  Left Ventricle: Left ventricular ejection fraction, by estimation, is 45 to 50%. The left ventricle has mildly decreased function. The left ventricle has no regional wall motion abnormalities. The left ventricular internal cavity size was normal in size. There is severe left ventricular hypertrophy of  the inferior segment. Left ventricular diastolic parameters are consistent with Grade II diastolic dysfunction (pseudonormalization). Right Ventricle: The right ventricular size is normal. No increase in right ventricular wall thickness. Right ventricular systolic function is normal. There is moderately elevated pulmonary artery systolic pressure. The tricuspid regurgitant velocity is 3.30 m/s, and with an assumed right atrial pressure of 15 mmHg, the estimated right ventricular systolic pressure is 89.3 mmHg. Left Atrium: Left atrial size  was severely dilated. Right Atrium: Right atrial size was normal in size. Pericardium: Trivial pericardial effusion is present. The pericardial effusion is localized near the right atrium. Mitral Valve: The mitral valve is normal in structure. Mild mitral valve regurgitation. No evidence of mitral valve stenosis. Tricuspid Valve: The tricuspid valve is normal in structure. Tricuspid valve regurgitation is mild . No evidence of tricuspid stenosis. Aortic Valve: The aortic valve is normal in structure. Aortic valve regurgitation is mild. No aortic stenosis is present. Aortic valve peak gradient measures 9.1 mmHg. Pulmonic Valve: The pulmonic valve was normal in structure. Pulmonic valve regurgitation is mild. No evidence of pulmonic stenosis. Aorta: The aortic root is normal in size and structure. Venous: The inferior vena cava is dilated in size with less than 50% respiratory variability, suggesting right atrial pressure of 15 mmHg. IAS/Shunts: No atrial level shunt detected by color flow Doppler.  LEFT VENTRICLE PLAX 2D LVIDd:         5.00 cm      Diastology LVIDs:         3.40 cm      LV e' medial:    6.18 cm/s LV PW:         1.70 cm      LV E/e' medial:  20.6 LV IVS:        1.20 cm      LV e' lateral:   5.77 cm/s LVOT diam:     2.00 cm      LV E/e' lateral: 22.0 LV SV:         77 LV SV Index:   42 LVOT Area:     3.14 cm  LV Volumes (MOD) LV vol d, MOD A4C: 131.0 ml LV vol s,  MOD A4C: 78.6 ml LV SV MOD A4C:     131.0 ml RIGHT VENTRICLE             IVC RV Basal diam:  3.20 cm     IVC diam: 2.50 cm RV S prime:     12.80 cm/s TAPSE (M-mode): 2.2 cm LEFT ATRIUM              Index        RIGHT ATRIUM           Index LA diam:        5.70 cm  3.13 cm/m   RA Area:     17.20 cm LA Vol (A2C):   150.0 ml 82.47 ml/m  RA Volume:   45.60 ml  25.07 ml/m LA Vol (A4C):   106.0 ml 58.28 ml/m LA Biplane Vol: 126.0 ml 69.27 ml/m  AORTIC VALVE                 PULMONIC VALVE AV Area (Vmax): 2.90 cm     PV Vmax:       0.92 m/s AV Vmax:        150.50 cm/s  PV Peak grad:  3.3 mmHg AV Peak Grad:   9.1 mmHg LVOT Vmax:      139.00 cm/s LVOT Vmean:     93.600 cm/s LVOT VTI:       0.246 m  AORTA Ao Root diam: 3.20 cm Ao Asc diam:  3.20 cm MITRAL VALVE                TRICUSPID VALVE MV Area (PHT): 5.34 cm     TR Peak grad:   43.6 mmHg MV Decel Time: 142 msec  TR Vmax:        330.00 cm/s MV E velocity: 127.00 cm/s MV A velocity: 65.00 cm/s   SHUNTS MV E/A ratio:  1.95         Systemic VTI:  0.25 m                             Systemic Diam: 2.00 cm Candee Furbish MD Electronically signed by Candee Furbish MD Signature Date/Time: 09/30/2021/1:41:23 PM    Final         Scheduled Meds:  amLODipine  10 mg Oral Daily   calcitRIOL  0.25 mcg Oral Daily   carvedilol  12.5 mg Oral BID WC   fluticasone furoate-vilanterol  1 puff Inhalation Daily   furosemide  80 mg Intravenous BID   heparin  5,000 Units Subcutaneous Q8H   hydrALAZINE  50 mg Oral Q8H   pantoprazole  40 mg Oral Daily   rifampin  300 mg Oral BID   sodium bicarbonate  650 mg Oral BID   Continuous Infusions:  Assessment & Plan:   Principal Problem:   Volume overload Active Problems:   Systemic lupus erythematosus (HCC)   Hypertensive urgency   ESRD (end stage renal disease) (HCC)   Acute pulmonary edema (HCC)   GERD without esophagitis   Volume overload/acute pulmonary edema Recent progression of CKD stage V to end-stage renal  disease Acute metabolic acidosis -As per discussion above, patient received first dialysis treatment on 09/04/2021  but subsequently patient wanted to hold off on further dialysis and try higher doses of diuretics as an outpatient.  -Presented with worsening shortness of breath with BNP of more than 4500 and creatinine of 6.46, up from 3.69 in early January.  Chest x-ray showed pulmonary edema. 2/1 nephrology following Metabolic acidosis likely improves with dialysis.  Continue sodium bicarb twice daily for now Continue IV Lasix management per nephrology Had dialysis today #2 plan for next HD on Friday Has a functional AVG working well CLIP underway Echo with EF 45-50% and grade II diastolic dysfunction. See full report.     Hypertensive urgency BP still elevated.  Even after dialysis. Increase hydralazine to 100 mg 3 times daily Continue with Coreg and amlodipine If need to can increase Coreg to 25 mg twice daily with parameters   SLE -Currently not on any active treatment.   2/1 outpatient follow-up with [facility]      Latent TB Continue rifampin  Anemia of chronic disease Iron acceptable, B12 normal, hemoglobin stable Continue to monitor     GERD -Continue PPI   DVT prophylaxis: Heparin Code Status: Full Family Communication: None at bedside Disposition Plan:  Status is: Inpatient Remains inpatient appropriate because: Receiving hemodialysis, IV treatment  Planned Discharge Destination: Home              LOS: 2 days   Time spent 35 minutes with more than 50% COC    Nolberto Hanlon, MD [facility] Pager [phone]  If 7PM-7AM, please contact night-coverage 10/01/2021, 8:37 AM

## 2021-10-02 DIAGNOSIS — Z111 Encounter for screening for respiratory tuberculosis: Secondary | ICD-10-CM | POA: Insufficient documentation

## 2021-10-02 DIAGNOSIS — T782XXA Anaphylactic shock, unspecified, initial encounter: Secondary | ICD-10-CM | POA: Insufficient documentation

## 2021-10-02 MED ORDER — RENA-VITE PO TABS: 1.0000 | ORAL_TABLET | Freq: Every day | ORAL | 0 refills | Status: AC

## 2021-10-02 MED ORDER — FUROSEMIDE 40 MG PO TABS
120.0000 mg | ORAL_TABLET | Freq: Two times a day (BID) | ORAL | Status: DC
  Administered 2021-10-02: 120 mg via ORAL
  Filled 2021-10-02: qty 3

## 2021-10-02 MED ORDER — LOSARTAN POTASSIUM 50 MG PO TABS
50.0000 mg | ORAL_TABLET | Freq: Every day | ORAL | Status: DC
  Administered 2021-10-02: 50 mg via ORAL
  Filled 2021-10-02: qty 1

## 2021-10-02 MED ORDER — FUROSEMIDE 40 MG PO TABS: 120.0000 mg | ORAL_TABLET | Freq: Two times a day (BID) | ORAL | 0 refills | Status: DC

## 2021-10-02 MED ORDER — LOSARTAN POTASSIUM 50 MG PO TABS: 50.0000 mg | ORAL_TABLET | Freq: Every day | ORAL | 0 refills | Status: DC

## 2021-10-02 MED ORDER — HYDRALAZINE HCL 100 MG PO TABS: 100.0000 mg | ORAL_TABLET | Freq: Three times a day (TID) | ORAL | 0 refills | Status: DC

## 2021-10-02 MED ORDER — CARVEDILOL 12.5 MG PO TABS: 12.5000 mg | ORAL_TABLET | Freq: Two times a day (BID) | ORAL | 0 refills | Status: DC

## 2021-10-02 NOTE — Progress Notes (Signed)
Nephrology Follow-Up Consult note   Assessment/Recommendations: Linda [name] is a/an 26 y.o. female with a past medical history significant for HTN, SLE, TMA, LVH who present w/ SOB and symptoms consistent w/ ESRD    ESRD: Has now failed outpatient management.   -Patient is stable for discharge to start dialysis outpatient tomorrow -Has a functional AVG working well -She is starting dialysis on Friday at [facility]   Hypertension: Likely will improve with volume status optimization.  Continue amlodipine, carvedilol, Lasix, hydralazine.  Switching IV Lasix to oral Lasix twice daily.  Also starting losartan 50 mg daily   Metabolic acidosis: Likely will improve with dialysis.  Continue sodium bicarbonate twice daily for now   Anemia of CKD: Likely multifactorial.  MCV somewhat high.  B12 normal. Iron acceptable. Hgb 10.9. CTM   Acute heart failure exacerbation: Mostly related to ESRD.  Improved at this time.   Secondary hyperparathyroidism: Continue home calcitriol.  Phos normal   Recommendations conveyed to primary service.    [facility] 10/02/2021 9:35 AM  ___________________________________________________________  CC: ESRD  Interval History/Subjective: Persistent headache but slightly better today.  Blood pressure about the same.  Tolerated dialysis   Medications:  Current Facility-Administered Medications  Medication Dose Route Frequency Provider Last Rate Last Admin   acetaminophen (TYLENOL) tablet 650 mg  650 mg Oral Q6H PRN Vernelle Emerald, MD   650 mg at 10/01/21 1147   Or   acetaminophen (TYLENOL) suppository 650 mg  650 mg Rectal Q6H PRN Shalhoub, Sherryll Burger, MD       amLODipine (NORVASC) tablet 10 mg  10 mg Oral Daily Shalhoub, Sherryll Burger, MD   10 mg at 10/02/21 6761   calcitRIOL (ROCALTROL) capsule 0.25 mcg  0.25 mcg Oral Daily Vernelle Emerald, MD   0.25 mcg at 10/02/21 0935   carvedilol (COREG) tablet 12.5 mg  12.5 mg Oral  BID WC Shalhoub, Sherryll Burger, MD   12.5 mg at 10/02/21 0934   feeding supplement (NEPRO CARB STEADY) liquid 237 mL  237 mL Oral BID BM Amery, Sahar, MD       fluticasone furoate-vilanterol (BREO ELLIPTA) 200-25 MCG/ACT 1 puff  1 puff Inhalation Daily Shalhoub, Sherryll Burger, MD   1 puff at 09/30/21 0735   furosemide (LASIX) tablet 120 mg  120 mg Oral BID Reesa Chew, MD   120 mg at 10/02/21 0935   heparin injection 5,000 Units  5,000 Units Subcutaneous Q8H Shalhoub, Sherryll Burger, MD   5,000 Units at 10/01/21 1536   hydrALAZINE (APRESOLINE) injection 10 mg  10 mg Intravenous Q6H PRN Vernelle Emerald, MD   10 mg at 10/01/21 1221   hydrALAZINE (APRESOLINE) tablet 100 mg  100 mg Oral Q8H Amery, Gwynneth Albright, MD   100 mg at 10/02/21 0636   losartan (COZAAR) tablet 50 mg  50 mg Oral Daily Reesa Chew, MD   50 mg at 10/02/21 0935   multivitamin (RENA-VIT) tablet 1 tablet  1 tablet Oral QHS Nolberto Hanlon, MD   1 tablet at 10/01/21 2132   oxyCODONE (Oxy IR/ROXICODONE) immediate release tablet 15 mg  15 mg Oral QID PRN Vernelle Emerald, MD   15 mg at 10/01/21 1542   pantoprazole (PROTONIX) EC tablet 40 mg  40 mg Oral Daily Vernelle Emerald, MD   40 mg at 10/02/21 0935   polyethylene glycol (MIRALAX / GLYCOLAX) packet 17 g  17 g Oral Daily PRN Shalhoub, Sherryll Burger, MD  rifampin (RIFADIN) capsule 300 mg  300 mg Oral BID Vernelle Emerald, MD   300 mg at 10/02/21 6568   sodium bicarbonate tablet 650 mg  650 mg Oral BID Vernelle Emerald, MD   650 mg at 10/02/21 0935      Review of Systems: 10 systems reviewed and negative except per interval history/subjective  Physical Exam: Vitals:   10/02/21 0431 10/02/21 0850  BP: (!) 177/113 (!) 185/137  Pulse: 80 86  Resp: 18 17  Temp: 98.1 F (36.7 C) 98 F (36.7 C)  SpO2: 100% 94%   Total I/O In: 300 [P.O.:300] Out: -   Intake/Output Summary (Last 24 hours) at 10/02/2021 0935 Last data filed at 10/02/2021 0800 Gross per 24 hour  Intake 897 ml  Output  1500 ml  Net -603 ml   Constitutional: well-appearing, no acute distress ENMT: ears and nose without scars or lesions, MMM CV: normal rate, no edema Respiratory: bilateral chest rise, normal work of breathing Gastrointestinal: soft, non-tender, no palpable masses or hernias Psych: alert, judgement/insight appropriate, appropriate mood and affect   Test Results I personally reviewed new and old clinical labs and radiology tests Lab Results  Component Value Date   NA 135 10/01/2021   K 3.5 10/01/2021   CL 101 10/01/2021   CO2 19 (L) 10/01/2021   BUN 38 (H) 10/01/2021   CREATININE 4.71 (H) 10/01/2021   CALCIUM 8.6 (L) 10/01/2021   ALBUMIN 3.3 (L) 09/30/2021   PHOS 3.8 09/30/2021

## 2021-10-02 NOTE — Progress Notes (Signed)
Discharge instructions given to patient.  Encouraged to keep up with HD and other follow up appointments.  Encouraged to call the doctor for questions.  Discharged home.

## 2021-10-02 NOTE — Care Management Important Message (Signed)
Important Message  Patient Details  Name: Linda [name] MRN: [id number] Date of Birth: 1995-12-01   Medicare Important Message Given:  Yes     Brannon Levene 10/02/2021, 1:06 PM

## 2021-10-02 NOTE — TOC Transition Note (Signed)
Transition of Care [facility]) - CM/SW Discharge Note   Patient Details  Name: Linda [name] MRN: [id number] Date of Birth: 1996-08-09  Transition of Care [facility]) CM/SW Contact:  Tom-Johnson, Renea Ee, RN Phone Number: 10/02/2021, 10:07 AM   Clinical Narrative:     Patient is scheduled for discharge today. CM called [name] [place] [phone]) per patient and grandmother's request to establish as a new patient and followup appointment post hospitalization. Information on AVS. Patient denies needs and no other needs identified. Grandmother to transport at discharge. No further TOC needs noted.   Final next level of care: Home/Self Care Barriers to Discharge: Barriers Resolved   Patient Goals and CMS Choice Patient states their goals for this hospitalization and ongoing recovery are:: To return home CMS Medicare.gov Compare Post Acute Care list provided to:: Patient Choice offered to / list presented to : Patient, Parent [employer] features editor)  Discharge Placement                       Discharge Plan and Services                DME Arranged: N/A DME Agency: NA       HH Arranged: NA HH Agency: NA        Social Determinants of Health (SDOH) Interventions     Readmission Risk Interventions Readmission Risk Prevention Plan 10/02/2021 10/01/2021  Transportation Screening - Complete  PCP or Specialist Appt within 3-5 Days Complete (No Data)  [place] or Home Care Consult Complete Not Complete  HRI or Home Care Consult comments - Lives with grandmother. No recommendations from PT/OT  Social Work Consult for [place] Planning/Counseling - Complete  Palliative Care Screening - Not Applicable  Medication Review (RN Care Manager) - Complete  Some recent data might be hidden

## 2021-10-02 NOTE — Discharge Summary (Signed)
Linda [name] SEG:[id number] DOB: September 05, 1995 DOA: 09/29/2021  PCP: Pcp, No  Admit date: 09/29/2021 Discharge date: 10/02/18/2023  Admitted From: Home Disposition: Home  Recommendations for Outpatient Follow-up:  Follow up with PCP in 1 week Please obtain BMP/CBC in one week Please follow up with dialysis center as scheduled Follow-up with cardiology in 1 week     Discharge Condition:Stable CODE STATUS: Full Diet recommendation: Renal    Brief/Interim Summary: Per HPI:26 year old female with history of SLE complicated by thrombotic microangiopathy resulting in chronic kidney disease stage V, discoid lupus with facial involvement, hypertension, latent TB, recent hospitalization from 09/02/2021-09/05/2021 for respiratory failure secondary to volume overload during which nephrology was of the opinion that the patient had progressed to ESRD for which patient received first dialysis treatment on 09/04/2021 but subsequently patient wanted to hold off on further dialysis and try higher doses of diuretics as an outpatient.  She presented with worsening shortness of breath.  On presentation, BNP was more than 4500, creatinine of 6.46 (up from 3.69 in early January).  Chest x-ray revealed mild bilateral interstitial pulmonary edema.  Nephrology was consulted.  She was found with volume overload and acute metabolic acidosis.  Was started on IV Lasix.  She was started on hemodialysis.  Blood pressure was elevated and her medications were adjusted.  When she started dialysis her symptoms improved.  She was cleared by nephrology to be discharged home with follow-up with dialysis as scheduled.  She was also found with LV systolic dysfunction on echocardiogram done as inpatient.  EF was 45 to 50%, and LVH which is likely due to her hypotensive state.  Told to follow-up with cardiology as outpatient.   Volume overload/acute pulmonary edema Recent progression of CKD stage V to end-stage renal disease Acute  metabolic acidosis  patient received first dialysis treatment on 09/04/2021  but subsequently patient wanted to hold off on further dialysis and try higher doses of diuretics as an outpatient.  -Presented with worsening shortness of breath with BNP of more than 4500 and creatinine of 6.46, up from 3.69 in early January.  Chest x-ray showed pulmonary edema. Nephrology consulted Metabolic acidosis likely improving  with dialysis.   Continue sodium bicarb twice daily  Was started on IV Lasix and then transition to p.o. by nephrology Had dialysis  #2 plan for next HD on Friday Has a functional AVG working well CLIP underway Echo with EF 45-50% and grade II diastolic dysfunction. See full report.  Below- will need to f/u with cardiology for further management. Needs good bp control.          Hypertensive urgency BP still elevated.  Even after dialysis. Her medications were adjusted Nephrology thought wants she continues with HD her bp will get better.   SLE -Currently not on any active treatment.    outpatient follow[facility]up with [facility]        Latent TB Continue rifampin  Anemia of chronic disease Iron acceptable, B12 normal, hemoglobin stable Monitor as outpatient       GERD -Continue PPI       Discharge Diagnoses:  Principal Problem:   Volume overload Active Problems:   Systemic lupus erythematosus ([place])   Hypertensive urgency   ESRD (end stage renal disease) ([place])   Acute pulmonary edema (H[facility])   GERD without esophagitis    Discharge Instructions  Discharge Instructions     Call MD for:  difficulty breathing, headache or visual disturbances   Complete by: As directed  Diet - low sodium heart healthy   Complete by: As directed    Discharge instructions   Complete by: As directed    Follow up with dialysis center tomorrow Please f/u with cardiology and pcp   Increase activity slowly   Complete by: As directed       Allergies as of  10/02/2021       Reactions   Lisinopril Anaphylaxis   Angioedema   Ativan [lorazepam] Other (See Comments)   Somnolent with 1mg  ativan at [facility] Nov 2019   Vicodin [hydrocodone-acetaminophen] Itching, Rash        Medication List     STOP taking these medications    amoxicillin-clavulanate 875-125 MG tablet Commonly known as: Augmentin   hydrOXYzine 25 MG tablet Commonly known as: ATARAX   lidocaine 5 % ointment Commonly known as: XYLOCAINE   medroxyPROGESTERone 150 MG/ML injection Commonly known as: DEPO-PROVERA   promethazine 25 MG tablet Commonly known as: PHENERGAN   valACYclovir 1000 MG tablet Commonly known as: Valtrex       TAKE these medications    amLODipine 10 MG tablet Commonly known as: NORVASC Take 10 mg by mouth daily.   B-complex with vitamin C tablet Take 1 tablet by mouth daily.   busPIRone 7.5 MG tablet Commonly known as: BUSPAR Take 1 tablet (7.5 mg total) by mouth once as needed for up to 1 dose (1 hr before flight).   calcitRIOL 0.25 MCG capsule Commonly known as: ROCALTROL Take 0.25 mcg by mouth daily.   carvedilol 12.5 MG tablet Commonly known as: COREG Take 1 tablet (12.5 mg total) by mouth 2 (two) times daily with a meal.   furosemide 40 MG tablet Commonly known as: LASIX Take 3 tablets (120 mg total) by mouth 2 (two) times daily. What changed:  medication strength how much to take   hydrALAZINE 100 MG tablet Commonly known as: APRESOLINE Take 1 tablet (100 mg total) by mouth 3 (three) times daily. What changed:  medication strength how much to take when to take this   losartan 50 MG tablet Commonly known as: COZAAR Take 1 tablet (50 mg total) by mouth daily.   multivitamin Tabs tablet Take 1 tablet by mouth at bedtime.   oxyCODONE 15 MG immediate release tablet Commonly known as: ROXICODONE Take 15 mg by mouth 4 (four) times daily as needed for pain.   pantoprazole 40 MG tablet Commonly known as:  PROTONIX Take 1 tablet (40 mg total) by mouth 2 (two) times daily.   rifampin 300 MG capsule Commonly known as: RIFADIN Take 300 mg by mouth 2 (two) times daily.   sodium bicarbonate 650 MG tablet Take 1 tablet (650 mg total) by mouth 2 (two) times daily.   Symbicort 160-4.5 MCG/ACT inhaler Generic drug: budesonide-formoterol Inhale 1 puff into the lungs daily as needed (For shortness of breath).        [place] Follow up.   Why: Schedule is Monday,Wednesday, Friday. Patient can start on Friday.  For first appointment, pt needs to arrive at 11:15 to complete paperwork for 12:00 chair time. Contact information: [place] [address] [phone]         Nelva Bush, MD Follow up in 1 week(s).   Specialty: Cardiology Contact information: [place] [address] [phone]                Allergies  Allergen Reactions  Lisinopril Anaphylaxis    Angioedema   Ativan [Lorazepam] Other (See Comments)    Somnolent with 1mg  ativan at [facility] Nov 2019   Vicodin [Hydrocodone-Acetaminophen] Itching and Rash    Consultations: Nephrology   Procedures/Studies: DG Chest 2 View  Result Date: 09/29/2021 CLINICAL DATA:  Shortness of breath. History of heart failure feels like fluid accumulating in lungs. History of pulmonary edema. EXAM: CHEST - 2 VIEW COMPARISON:  Chest radiographs 09/02/2021 and 08/22/2018 FINDINGS: Cardiac silhouette is again mildly to moderately enlarged. Mediastinal contours are within normal limits. There is again cephalization of the pulmonary vasculature. Mild bilateral mid and lower lung interstitial thickening is similar to prior. No pleural effusion. No pneumothorax. No acute skeletal abnormality. IMPRESSION: No significant change in mild-to-moderate enlargement of the cardiac silhouette and mild bilateral interstitial pulmonary edema. Electronically  Signed   By: Yvonne Kendall M.D.   On: 09/29/2021 15:07   ECHOCARDIOGRAM COMPLETE  Result Date: 09/30/2021    ECHOCARDIOGRAM REPORT   Patient Name:   Linda [name] Date of Exam: 09/30/2021 Medical Rec #:  [id number]         Height:       66.0 in Accession #:    [id number]        Weight:       160.0 lb Date of Birth:  1996-08-23         BSA:          1.819 m Patient Age:    25 years          BP:           179/135 mmHg Patient Gender: F                 HR:           75 bpm. Exam Location:  Inpatient Procedure: 2D Echo Indications:    CHF  History:        Patient has prior history of Echocardiogram examinations, most                 recent 11/30/2017. Risk Factors:Hypertension.  Sonographer:    Jefferey Pica Referring Phys: [id number] [place]  1. Left ventricular ejection fraction, by estimation, is 45 to 50%. The left ventricle has mildly decreased function. The left ventricle has no regional wall motion abnormalities. There is severe left ventricular hypertrophy of the inferior segment. Left ventricular diastolic parameters are consistent with Grade II diastolic dysfunction (pseudonormalization).  2. Right ventricular systolic function is normal. The right ventricular size is normal. There is moderately elevated pulmonary artery systolic pressure. The estimated right ventricular systolic pressure is 63.7 mmHg.  3. Left atrial size was severely dilated.  4. The pericardial effusion is localized near the right atrium.  5. The mitral valve is normal in structure. Mild mitral valve regurgitation. No evidence of mitral stenosis.  6. The aortic valve is normal in structure. Aortic valve regurgitation is mild. No aortic stenosis is present.  7. The inferior vena cava is dilated in size with <50% respiratory variability, suggesting right atrial pressure of 15 mmHg. FINDINGS  Left Ventricle: Left ventricular ejection fraction, by estimation, is 45 to 50%. The left ventricle has mildly decreased  function. The left ventricle has no regional wall motion abnormalities. The left ventricular internal cavity size was normal in size. There is severe left ventricular hypertrophy of the inferior segment. Left ventricular diastolic parameters are consistent with Grade II diastolic dysfunction (pseudonormalization). Right Ventricle: The right  ventricular size is normal. No increase in right ventricular wall thickness. Right ventricular systolic function is normal. There is moderately elevated pulmonary artery systolic pressure. The tricuspid regurgitant velocity is 3.30 m/s, and with an assumed right atrial pressure of 15 mmHg, the estimated right ventricular systolic pressure is 62.3 mmHg. Left Atrium: Left atrial size was severely dilated. Right Atrium: Right atrial size was normal in size. Pericardium: Trivial pericardial effusion is present. The pericardial effusion is localized near the right atrium. Mitral Valve: The mitral valve is normal in structure. Mild mitral valve regurgitation. No evidence of mitral valve stenosis. Tricuspid Valve: The tricuspid valve is normal in structure. Tricuspid valve regurgitation is mild . No evidence of tricuspid stenosis. Aortic Valve: The aortic valve is normal in structure. Aortic valve regurgitation is mild. No aortic stenosis is present. Aortic valve peak gradient measures 9.1 mmHg. Pulmonic Valve: The pulmonic valve was normal in structure. Pulmonic valve regurgitation is mild. No evidence of pulmonic stenosis. Aorta: The aortic root is normal in size and structure. Venous: The inferior vena cava is dilated in size with less than 50% respiratory variability, suggesting right atrial pressure of 15 mmHg. IAS/Shunts: No atrial level shunt detected by color flow Doppler.  LEFT VENTRICLE PLAX 2D LVIDd:         5.00 cm      Diastology LVIDs:         3.40 cm      LV e' medial:    6.18 cm/s LV PW:         1.70 cm      LV E/e' medial:  20.6 LV IVS:        1.20 cm      LV e'  lateral:   5.77 cm/s LVOT diam:     2.00 cm      LV E/e' lateral: 22.0 LV SV:         77 LV SV Index:   42 LVOT Area:     3.14 cm  LV Volumes (MOD) LV vol d, MOD A4C: 131.0 ml LV vol s, MOD A4C: 78.6 ml LV SV MOD A4C:     131.0 ml RIGHT VENTRICLE             IVC RV Basal diam:  3.20 cm     IVC diam: 2.50 cm RV S prime:     12.80 cm/s TAPSE (M-mode): 2.2 cm LEFT ATRIUM              Index        RIGHT ATRIUM           Index LA diam:        5.70 cm  3.13 cm/m   RA Area:     17.20 cm LA Vol (A2C):   150.0 ml 82.47 ml/m  RA Volume:   45.60 ml  25.07 ml/m LA Vol (A4C):   106.0 ml 58.28 ml/m LA Biplane Vol: 126.0 ml 69.27 ml/m  AORTIC VALVE                 PULMONIC VALVE AV Area (Vmax): 2.90 cm     PV Vmax:       0.92 m/s AV Vmax:        150.50 cm/s  PV Peak grad:  3.3 mmHg AV Peak Grad:   9.1 mmHg LVOT Vmax:      139.00 cm/s LVOT Vmean:     93.600 cm/s LVOT VTI:       0.246 m  AORTA Ao Root diam: 3.20 cm Ao Asc diam:  3.20 cm MITRAL VALVE                TRICUSPID VALVE MV Area (PHT): 5.34 cm     TR Peak grad:   43.6 mmHg MV Decel Time: 142 msec     TR Vmax:        330.00 cm/s MV E velocity: 127.00 cm/s MV A velocity: 65.00 cm/s   SHUNTS MV E/A ratio:  1.95         Systemic VTI:  0.25 m                             Systemic Diam: 2.00 cm Candee Furbish MD Electronically signed by Candee Furbish MD Signature Date/Time: 09/30/2021/1:41:23 PM    Final       Subjective: Feels well.  Has no complaints.  Headaches improved  Discharge Exam: Vitals:   10/02/21 0431 10/02/21 0850  BP: (!) 177/113 (!) 185/137  Pulse: 80 86  Resp: 18 17  Temp: 98.1 F (36.7 C) 98 F (36.7 C)  SpO2: 100% 94%   Vitals:   10/01/21 2057 10/02/21 0431 10/02/21 0434 10/02/21 0850  BP: (!) 162/99 (!) 177/113  (!) 185/137  Pulse: 85 80  86  Resp: 18 18  17   Temp: 98.4 F (36.9 C) 98.1 F (36.7 C)  98 F (36.7 C)  TempSrc:  Oral  Oral  SpO2: 100% 100%  94%  Weight:   75 kg   Height:        General: Pt is alert, awake, not in  acute distress Cardiovascular: RRR, S1/S2 +, no rubs, no gallops Respiratory: CTA bilaterally, no wheezing, no rhonchi Abdominal: Soft, NT, ND, bowel sounds + Extremities: no edema, no cyanosis    The results of significant diagnostics from this hospitalization (including imaging, microbiology, ancillary and laboratory) are listed below for reference.     Microbiology: Recent Results (from the past 240 hour(s))  Resp Panel by RT-PCR (Flu A&B, Covid) Nasopharyngeal Swab     Status: None   Collection Time: 09/30/21 10:54 AM   Specimen: Nasopharyngeal Swab; Nasopharyngeal(NP) swabs in vial transport medium  Result Value Ref Range Status   SARS Coronavirus 2 by RT PCR NEGATIVE NEGATIVE Final    Comment: (NOTE) SARS-CoV-2 target nucleic acids are NOT DETECTED.  The SARS-CoV-2 RNA is generally detectable in upper respiratory specimens during the acute phase of infection. The lowest concentration of SARS-CoV-2 viral copies this assay can detect is 138 copies/mL. A negative result does not preclude SARS-Cov-2 infection and should not be used as the sole basis for treatment or other patient management decisions. A negative result may occur with  improper specimen collection/handling, submission of specimen other than nasopharyngeal swab, presence of viral mutation(s) within the areas targeted by this assay, and inadequate number of viral copies(<138 copies/mL). A negative result must be combined with clinical observations, patient history, and epidemiological information. The expected result is Negative.  Fact Sheet for Patients:  EntrepreneurPulse.com.au  Fact Sheet for Healthcare Providers:  IncredibleEmployment.be  This test is no t yet approved or cleared by the [place] FDA and  has been authorized for detection and/or diagnosis of SARS-CoV-2 by FDA under an Emergency Use Authorization (EUA). This EUA will remain  in effect (meaning this  test can be used) for the duration of the COVID-19 declaration under Section 564(b)(1) of the Act, 21 U.S.C.section 360bbb-3(b)(1),  unless the authorization is terminated  or revoked sooner.       Influenza A by PCR NEGATIVE NEGATIVE Final   Influenza B by PCR NEGATIVE NEGATIVE Final    Comment: (NOTE) The Xpert Xpress SARS-CoV-2/FLU/RSV plus assay is intended as an aid in the diagnosis of influenza from Nasopharyngeal swab specimens and should not be used as a sole basis for treatment. Nasal washings and aspirates are unacceptable for Xpert Xpress SARS-CoV-2/FLU/RSV testing.  Fact Sheet for Patients: EntrepreneurPulse.com.au  Fact Sheet for Healthcare Providers: IncredibleEmployment.be  This test is not yet approved or cleared by the [place] FDA and has been authorized for detection and/or diagnosis of SARS-CoV-2 by FDA under an Emergency Use Authorization (EUA). This EUA will remain in effect (meaning this test can be used) for the duration of the COVID-19 declaration under Section 564(b)(1) of the Act, 21 U.S.C. section 360bbb-3(b)(1), unless the authorization is terminated or revoked.  Performed at [facility], [place] [address], [place], [place] [address]      Labs: BNP (last 3 results) Recent Labs    09/29/21 2138  BNP >6,256.3*   Basic Metabolic Panel: Recent Labs  Lab 09/29/21 1446 09/30/21 0250 09/30/21 1938 10/01/21 0424  NA 137 138  --  135  K 4.0 3.9  --  3.5  CL 107 109  --  101  CO2 16* 17*  --  19*  GLUCOSE 80 90  --  106*  BUN 63* 65*  --  38*  CREATININE 6.46* 6.62*  --  4.71*  CALCIUM 8.5* 8.3*  --  8.6*  MG 1.9 2.0  --   --   PHOS  --   --  3.8  --    Liver Function Tests: Recent Labs  Lab 09/30/21 0250  AST 19  ALT 15  ALKPHOS 74  BILITOT 0.3  PROT 7.0  ALBUMIN 3.3*   No results for input(s): LIPASE, AMYLASE in the last 168 hours. No results for input(s): AMMONIA in the last 168  hours. CBC: Recent Labs  Lab 09/29/21 1446 09/30/21 0250 10/01/21 0424  WBC 8.1 8.9 7.6  NEUTROABS  --  3.7 4.4  HGB 11.4* 9.9* 10.9*  HCT 35.0* 31.6* 33.0*  MCV 98.3 100.3* 96.5  PLT 195 180 197   Cardiac Enzymes: No results for input(s): CKTOTAL, CKMB, CKMBINDEX, TROPONINI in the last 168 hours. BNP: Invalid input(s): POCBNP CBG: No results for input(s): GLUCAP in the last 168 hours. D-Dimer No results for input(s): DDIMER in the last 72 hours. Hgb A1c No results for input(s): HGBA1C in the last 72 hours. Lipid Profile No results for input(s): CHOL, HDL, LDLCALC, TRIG, CHOLHDL, LDLDIRECT in the last 72 hours. Thyroid function studies No results for input(s): TSH, T4TOTAL, T3FREE, THYROIDAB in the last 72 hours.  Invalid input(s): FREET3 Anemia work up No results for input(s): VITAMINB12, FOLATE, FERRITIN, TIBC, IRON, RETICCTPCT in the last 72 hours.  Urinalysis    Component Value Date/Time   COLORURINE YELLOW 08/22/2018 1207   APPEARANCEUR CLEAR 08/22/2018 1207   LABSPEC 1.015 08/22/2018 1207   PHURINE 8.0 08/22/2018 1207   GLUCOSEU NEGATIVE 08/22/2018 1207   HGBUR NEGATIVE 08/22/2018 1207   BILIRUBINUR NEGATIVE 08/22/2018 1207   BILIRUBINUR Negative 02/10/2018 1602   KETONESUR NEGATIVE 08/22/2018 1207   PROTEINUR 100 (A) 08/22/2018 1207   UROBILINOGEN 0.2 02/10/2018 1602   UROBILINOGEN 1.0 03/30/2015 2328   NITRITE NEGATIVE 08/22/2018 [place] 08/22/2018 1207   Sepsis Labs Invalid  input(s): PROCALCITONIN,  WBC,  LACTICIDVEN Microbiology Recent Results (from the past 240 hour(s))  Resp Panel by RT-PCR (Flu A&B, Covid) Nasopharyngeal Swab     Status: None   Collection Time: 09/30/21 10:54 AM   Specimen: Nasopharyngeal Swab; Nasopharyngeal(NP) swabs in vial transport medium  Result Value Ref Range Status   SARS Coronavirus 2 by RT PCR NEGATIVE NEGATIVE Final    Comment: (NOTE) SARS-CoV-2 target nucleic acids are NOT DETECTED.  The  SARS-CoV-2 RNA is generally detectable in upper respiratory specimens during the acute phase of infection. The lowest concentration of SARS-CoV-2 viral copies this assay can detect is 138 copies/mL. A negative result does not preclude SARS-Cov-2 infection and should not be used as the sole basis for treatment or other patient management decisions. A negative result may occur with  improper specimen collection/handling, submission of specimen other than nasopharyngeal swab, presence of viral mutation(s) within the areas targeted by this assay, and inadequate number of viral copies(<138 copies/mL). A negative result must be combined with clinical observations, patient history, and epidemiological information. The expected result is Negative.  Fact Sheet for Patients:  EntrepreneurPulse.com.au  Fact Sheet for Healthcare Providers:  IncredibleEmployment.be  This test is no t yet approved or cleared by the [place] FDA and  has been authorized for detection and/or diagnosis of SARS-CoV-2 by FDA under an Emergency Use Authorization (EUA). This EUA will remain  in effect (meaning this test can be used) for the duration of the COVID-19 declaration under Section 564(b)(1) of the Act, 21 U.S.C.section 360bbb-3(b)(1), unless the authorization is terminated  or revoked sooner.       Influenza A by PCR NEGATIVE NEGATIVE Final   Influenza B by PCR NEGATIVE NEGATIVE Final    Comment: (NOTE) The Xpert Xpress SARS-CoV-2/FLU/RSV plus assay is intended as an aid in the diagnosis of influenza from Nasopharyngeal swab specimens and should not be used as a sole basis for treatment. Nasal washings and aspirates are unacceptable for Xpert Xpress SARS-CoV-2/FLU/RSV testing.  Fact Sheet for Patients: EntrepreneurPulse.com.au  Fact Sheet for Healthcare Providers: IncredibleEmployment.be  This test is not yet approved or  cleared by the [place] FDA and has been authorized for detection and/or diagnosis of SARS-CoV-2 by FDA under an Emergency Use Authorization (EUA). This EUA will remain in effect (meaning this test can be used) for the duration of the COVID-19 declaration under Section 564(b)(1) of the Act, 21 U.S.C. section 360bbb-3(b)(1), unless the authorization is terminated or revoked.  Performed at [facility], [place] [address] [place], [place] [address]      Time coordinating discharge: Over 30 minutes  SIGNED:   Nolberto Hanlon, MD  [facility] 10/05/2021, 2:48 PM Pager   If 7PM-7AM, please contact night-coverage [email or web address] Password [email or web address]

## 2021-10-02 NOTE — Progress Notes (Addendum)
Pt for d/c today. Contacted [facility] and spoke to [place]. Clinic aware pt for d/c today and will start at clinic tomorrow. Contacted Renal PA to request that orders be sent to clinic for tomorrow. Met with pt at bedside who voices understanding of arrangements and aware of need to start at clinic tomorrow.   Melven Sartorius Renal Navigator [phone]

## 2021-10-03 ENCOUNTER — Telehealth: Payer: Self-pay | Admitting: Nephrology

## 2021-10-03 ENCOUNTER — Telehealth: Payer: Self-pay

## 2021-10-03 NOTE — Telephone Encounter (Signed)
Transition of Care Contact from [place]  Date of Discharge: 10/02/21 Date of Contact: 10/03/21 Method of contact: phone - attempted  Attempted to contact patient to discuss transition of care from inpatient admission.  Patient did not answer the phone.  Will attempt to call them again and/or will follow up at dialysis.  Jen Mow, PA-C [facility] Pager: [phone]  Disregard previous telephone call note today as the dates were entered incorrectly.

## 2021-10-03 NOTE — Telephone Encounter (Signed)
Transition of Care Contact from [place]  Date of Discharge: 09/29/21  Date of Contact: 10/02/21 Method of contact: phone - attempted  Attempted to contact patient to discuss transition of care from inpatient admission.  Patient did not answer the phone.  Will attempt to call them again and/or follow up at dialysis.  Jen Mow, PA-C [facility] Pager: [phone]

## 2021-10-03 NOTE — Telephone Encounter (Signed)
Contact patient and lvm to confirm appointment with Caleen Jobs on 2/6 [phone]

## 2021-10-06 ENCOUNTER — Ambulatory Visit: Payer: Medicare Other | Admitting: Family Medicine

## 2021-11-11 ENCOUNTER — Other Ambulatory Visit: Payer: Self-pay

## 2021-11-11 ENCOUNTER — Emergency Department (HOSPITAL_BASED_OUTPATIENT_CLINIC_OR_DEPARTMENT_OTHER)
Admission: EM | Admit: 2021-11-11 | Discharge: 2021-11-11 | Disposition: A | Discharge status: Home / Self Care | Payer: Medicare Other | Attending: Emergency Medicine | Admitting: Emergency Medicine

## 2021-11-11 ENCOUNTER — Encounter (HOSPITAL_BASED_OUTPATIENT_CLINIC_OR_DEPARTMENT_OTHER): Payer: Self-pay | Admitting: Emergency Medicine

## 2021-11-11 DIAGNOSIS — I120 Hypertensive chronic kidney disease with stage 5 chronic kidney disease or end stage renal disease: Secondary | ICD-10-CM | POA: Insufficient documentation

## 2021-11-11 DIAGNOSIS — Z992 Dependence on renal dialysis: Secondary | ICD-10-CM | POA: Diagnosis not present

## 2021-11-11 DIAGNOSIS — Z79899 Other long term (current) drug therapy: Secondary | ICD-10-CM | POA: Insufficient documentation

## 2021-11-11 DIAGNOSIS — Z0000 Encounter for general adult medical examination without abnormal findings: Secondary | ICD-10-CM | POA: Diagnosis present

## 2021-11-11 DIAGNOSIS — N186 End stage renal disease: Secondary | ICD-10-CM | POA: Insufficient documentation

## 2021-11-11 DIAGNOSIS — R7989 Other specified abnormal findings of blood chemistry: Secondary | ICD-10-CM | POA: Insufficient documentation

## 2021-11-11 DIAGNOSIS — Z0189 Encounter for other specified special examinations: Secondary | ICD-10-CM

## 2021-11-11 LAB — COMPREHENSIVE METABOLIC PANEL
ALT: 5 U/L (ref 0–44)
AST: 12 U/L — ABNORMAL LOW (ref 15–41)
Albumin: 3.8 g/dL (ref 3.5–5.0)
Alkaline Phosphatase: 78 U/L (ref 38–126)
Anion gap: 12 (ref 5–15)
BUN: 68 mg/dL — ABNORMAL HIGH (ref 6–20)
CO2: 22 mmol/L (ref 22–32)
Calcium: 9.1 mg/dL (ref 8.9–10.3)
Chloride: 104 mmol/L (ref 98–111)
Creatinine, Ser: 7.06 mg/dL — ABNORMAL HIGH (ref 0.44–1.00)
GFR, Estimated: 8 mL/min — ABNORMAL LOW (ref >60)
Glucose, Bld: 88 mg/dL (ref 70–99)
Potassium: 4.4 mmol/L (ref 3.5–5.1)
Sodium: 138 mmol/L (ref 135–145)
Total Bilirubin: 0.3 mg/dL (ref 0.3–1.2)
Total Protein: 7.5 g/dL (ref 6.5–8.1)

## 2021-11-11 LAB — CBC
HCT: 32.5 % — ABNORMAL LOW (ref 36.0–46.0)
Hemoglobin: 10.1 g/dL — ABNORMAL LOW (ref 12.0–15.0)
MCH: 30.4 pg (ref 26.0–34.0)
MCHC: 31.1 g/dL (ref 30.0–36.0)
MCV: 97.9 fL (ref 80.0–100.0)
Platelets: 161 10*3/uL (ref 150–400)
RBC: 3.32 MIL/uL — ABNORMAL LOW (ref 3.87–5.11)
RDW: 12.9 % (ref 11.5–15.5)
WBC: 6.6 10*3/uL (ref 4.0–10.5)
nRBC: 0 % (ref 0.0–0.2)

## 2021-11-11 NOTE — ED Triage Notes (Signed)
Pt arrives to ED to have lab work drawn to return to her dialysis center. Pts last dialysis treatment was 10/31/21.  ?

## 2021-11-11 NOTE — Discharge Instructions (Signed)
You were seen in the emergency department today for lab work.  Your creatinine is elevated however you did not have any concerns about your electrolytes.  Please go to dialysis tomorrow.  I have printed off your lab work if they need this.  Please return for shortness of breath, chest pain ? ?Additionally please show the [facility] of these instructions.  It is important for them to help provide you with resources to obtain outpatient laboratory work if that is required by them.  It should not be required to come to the emergency department if you are asymptomatic to receive laboratory studies.  There are outpatient centers to help with this.  Please ask them to provide you with resources. ?

## 2021-11-11 NOTE — ED Provider Notes (Signed)
?[facility] EMERGENCY DEPT ?Provider Note ? ? ?CSN: [id number] ?Arrival date & time: 11/11/21  1256 ? ?  ? ?History ? ?Chief Complaint  ?Patient presents with  ? Labs Only  ? ? ?Linda [name] is a 26 y.o. female.  With past medical history of hypertension, mixed connective tissue disorder, end-stage renal disease on hemodialysis on Monday, Wednesday, Friday who presents to the emergency department for lab draw. ? ?Patient states that she has not had dialysis since 10/31/2021.  She states that she was told by [facility] that in order to go back she had to have her labs drawn to "see if they were okay."  She states she has not gone to dialysis since 10/31/2021 because "I am tired of it."  She does appear emotional about this.  She states she is on dialysis due to her high blood pressure.  She currently denies any chest pain, shortness of breath, abdominal pain, nausea or vomiting. ? ?HPI ? ?  ? ?Home Medications ?Prior to Admission medications   ?Medication Sig Start Date End Date Taking? Authorizing Provider  ?amLODipine (NORVASC) 10 MG tablet Take 10 mg by mouth daily. 08/05/21   [provider]  ?B Complex-C (B-COMPLEX WITH VITAMIN C) tablet Take 1 tablet by mouth daily.    [provider]  ?busPIRone (BUSPAR) 7.5 MG tablet Take 1 tablet (7.5 mg total) by mouth once as needed for up to 1 dose (1 hr before flight). ?Patient not taking: Reported on 02/23/2020 11/03/19   Caroline More, DO  ?calcitRIOL (ROCALTROL) 0.25 MCG capsule Take 0.25 mcg by mouth daily. 08/01/21   [provider]  ?carvedilol (COREG) 12.5 MG tablet Take 1 tablet (12.5 mg total) by mouth 2 (two) times daily with a meal. 10/02/21 11/01/21  Nolberto Hanlon, MD  ?furosemide (LASIX) 40 MG tablet Take 3 tablets (120 mg total) by mouth 2 (two) times daily. 10/02/21 11/01/21  Nolberto Hanlon, MD  ?hydrALAZINE (APRESOLINE) 100 MG tablet Take 1 tablet (100 mg total) by mouth 3 (three) times daily. 10/02/21 11/01/21  Nolberto Hanlon, MD  ?losartan (COZAAR) 50 MG tablet Take 1 tablet (50 mg total) by mouth daily. 10/02/21 11/01/21  Nolberto Hanlon, MD  ?oxyCODONE (ROXICODONE) 15 MG immediate release tablet Take 15 mg by mouth 4 (four) times daily as needed for pain. 09/24/21   [provider]  ?pantoprazole (PROTONIX) 40 MG tablet Take 1 tablet (40 mg total) by mouth 2 (two) times daily. ?Patient not taking: Reported on 09/02/2021 01/28/18 09/02/21  Alma Friendly, MD  ?rifampin (RIFADIN) 300 MG capsule Take 300 mg by mouth 2 (two) times daily. 08/22/21   [provider]  ?sodium bicarbonate 650 MG tablet Take 1 tablet (650 mg total) by mouth 2 (two) times daily. 09/05/21   Thurnell Lose, MD  ?Dellis Anes 160-4.5 MCG/ACT inhaler Inhale 1 puff into the lungs daily as needed (For shortness of breath). 07/29/21   [provider]  ?   ? ?Allergies    ?Lisinopril, Ativan [lorazepam], and Vicodin [hydrocodone-acetaminophen]   ? ?Review of Systems   ?Review of Systems  ?All other systems reviewed and are negative. ? ?Physical Exam ?Updated Vital Signs ?BP (!) 138/107 (BP Location: Right Arm)   Pulse 69   Temp 98.6 ?F (37 ?C) (Oral)   Resp 18   Ht 5\' 6"  (1.676 m)   Wt 77.1 kg   SpO2 98%   BMI 27.44 kg/m?  ?Physical Exam ?Vitals and nursing note reviewed.  ?  Constitutional:   ?   General: She is not in acute distress. ?   Appearance: Normal appearance. She is normal weight. She is not ill-appearing.  ?HENT:  ?   Head: Normocephalic and atraumatic.  ?   Mouth/Throat:  ?   Mouth: Mucous membranes are moist.  ?   Pharynx: Oropharynx is clear.  ?Eyes:  ?   General: No scleral icterus. ?   Extraocular Movements: Extraocular movements intact.  ?Cardiovascular:  ?   Rate and Rhythm: Normal rate and regular rhythm.  ?   Pulses: Normal pulses.  ?   Heart sounds: No murmur heard. ?Pulmonary:  ?   Effort: Pulmonary effort is normal. No respiratory distress.  ?   Breath sounds: Normal breath sounds.  ?Abdominal:  ?   General: Bowel  sounds are normal. There is no distension.  ?   Palpations: Abdomen is soft.  ?   Tenderness: There is no abdominal tenderness.  ?Musculoskeletal:     ?   General: Normal range of motion.  ?   Cervical back: Neck supple.  ?Skin: ?   General: Skin is warm and dry.  ?   Capillary Refill: Capillary refill takes less than 2 seconds.  ?   Findings: No rash.  ?Neurological:  ?   General: No focal deficit present.  ?   Mental Status: She is alert and oriented to person, place, and time. Mental status is at baseline.  ?Psychiatric:     ?   Mood and Affect: Mood normal.     ?   Behavior: Behavior normal.     ?   Thought Content: Thought content normal.     ?   Judgment: Judgment normal.  ? ? ?ED Results / Procedures / Treatments   ?Labs ?(all labs ordered are listed, but only abnormal results are displayed) ?Labs Reviewed  ?CBC - Abnormal; Notable for the following components:  ?    Result Value  ? RBC 3.32 (*)   ? Hemoglobin 10.1 (*)   ? HCT 32.5 (*)   ? All other components within normal limits  ?COMPREHENSIVE METABOLIC PANEL - Abnormal; Notable for the following components:  ? BUN 68 (*)   ? Creatinine, Ser 7.06 (*)   ? AST 12 (*)   ? GFR, Estimated 8 (*)   ? All other components within normal limits  ? ?EKG ?None ? ?Radiology ?No results found. ? ?Procedures ?Procedures  ? ? ?Medications Ordered in ED ?Medications - No data to display ? ?ED Course/ Medical Decision Making/ A&P ?  ?                        ?Medical Decision Making ?Amount and/or Complexity of Data Reviewed ?Labs: ordered. ? ?This patient presents to the ED for concern of lab draw, this involves an extensive number of treatment options, and is a complaint that carries with it a high risk of complications and morbidity.  The differential diagnosis includes N/A ? ? ?Co morbidities that complicate the patient evaluation ?End-stage renal disease, heart failure ? ?Additional history obtained:  ?Additional history obtained from: None ?External records from  outside source obtained and reviewed including: Previous dialysis visits ? ?Lab Results: ?I personally ordered, reviewed, and interpreted labs. ?Pertinent results include: ?CBC without leukocytosis, hemoglobin 10.1, similar to previous ?CMP with creatinine of 7.06, no electrolyte derangements ? ?ED Course: ?26 year old female who presents emergency department for lab work at the instruction of her dialysis  center. ? ?She is currently asymptomatic and in no acute distress. ? ?CBC and CMP drawn and resulted as above.  There are no electrolyte derangements.  She is not in need of emergent dialysis at this time. ? ?Discussed with patient that she should ask for [facility] for instruction on how to obtain outpatient lab work she does not need to present to the ER if she is asymptomatic.  I have printed off her lab work so she can show for sinuous should they have questions.  Additionally she should follow-up with her nephrologist about her ongoing need for dialysis.  She appears emotional when talking about having to go back on dialysis after 2 years of stable kidney function.  She is instructed to return for any chest pain, shortness of breath, abdominal pain with nausea or vomiting. ? ?After consideration of the diagnostic results and the patients response to treatment, I feel that the patent would benefit from discharge. ?The patient has been appropriately medically screened and/or stabilized in the ED. I have low suspicion for any other emergent medical condition which would require further screening, evaluation or treatment in the ED or require inpatient management. The patient is overall well appearing and non-toxic in appearance. They are hemodynamically stable at time of discharge.   ?Final Clinical Impression(s) / ED Diagnoses ?Final diagnoses:  ?Encounter for laboratory test  ? ? ?Rx / DC Orders ?ED Discharge Orders   ? ? None  ? ?  ? ? ?  ?Mickie Hillier, PA-C ?11/11/21 1538 ? ?  ?Godfrey Pick,  MD ?11/12/21 [phone] ? ?

## 2021-11-20 ENCOUNTER — Telehealth: Payer: Self-pay | Admitting: Emergency Medicine

## 2021-11-20 NOTE — Telephone Encounter (Signed)
T/C to patient regarding request to refill lidocaine ointment. Pt states she may have requested to refill the wrong medication, and does not want this medication.  ?

## 2022-05-13 ENCOUNTER — Telehealth (INDEPENDENT_AMBULATORY_CARE_PROVIDER_SITE_OTHER): Payer: Self-pay | Admitting: Vascular Surgery

## 2022-05-13 NOTE — Telephone Encounter (Signed)
Patient called in stating that her fistula was messing up and she wanted Dr. Lucky Cowboy to get her in ASAP to get it fixed. I informed patient that dialysis would have to send a order over and then we could get the process started to schedule that. She stated that hey told her she could go to the hospital and be seen and get the fistula started on quicker that way, patient didn't;'t want to go about it that way because she only wanted Dr. Lucky Cowboy to touch her. Let her know just talk with dialysis and have them fax the order and our surgery scheduler will get her on the books as soon as she could

## 2022-05-14 ENCOUNTER — Other Ambulatory Visit (HOSPITAL_COMMUNITY): Payer: Self-pay | Admitting: Nephrology

## 2022-05-14 DIAGNOSIS — N186 End stage renal disease: Secondary | ICD-10-CM

## 2022-05-15 ENCOUNTER — Other Ambulatory Visit: Payer: Self-pay | Admitting: Radiology

## 2022-05-16 NOTE — H&P (Signed)
Chief Complaint: Patient was seen in consultation today for LUE AVG shuntogram with possible intervention at the request of [place]  Referring Physician(s): [place]  Supervising Physician: Juliet Rude  Patient Status: [facility] - Out-pt  History of Present Illness: Linda [name] is a 26 y.o. female with PMHs of HTN, lupus, ESRD s/p left brachiocephalic arteriovenous graft creation on 03/30/2019 who presents today for AVG shuntogram with possible intervention.  Chart reviewed, patient was never seen by [facility] imaging interventional radiology.  Patient was referred to IR by her nephrologist due to clotted AVG, she presents to [facility] IR today for LUE AVG shuntogram with possible intervention.  Patient laying in bed, not in acute distress.  Denise headache, fever, chills, shortness of breath, cough, chest pain, abdominal pain, nausea ,vomiting, and bleeding.  Patient states that she "blacked out" when she received Ativan in 2019, her body was going through a lot at the time and that is when her kidney started to fail. She does not remember exactly what happened but she does not think she stopped breathing, she just because weak and somnolent, people had hard time waking her up.  She had Xanax after the incident with Ativan and she did not have any reaction to Xanax.  Informed the patient that she might be sensitive to Ativan but it does not seem like she is truly allergic to Ativan, but we will start low does on Versed, and IR will be able to  reverse Versed if she develops any adversive reaction.  She became tearful and nervous, but ultimately agrees to proceed with the procedure.     Pt asks if she will be able to received dialysis after the procedure today, chart checked and there is no appointment in [facility].   Past Medical History:  Diagnosis Date   Abnormal ECG    a. In setting of LVH w/ repolarization abnormalities; b. 03/2019 MV: No ischemia/infarct.  EF 55-65%.   Angioedema    ESRD (end stage renal disease) ([place])    a. prev on HD.   GERD (gastroesophageal reflux disease)    Hypertension    Lupus (HCC)    LVH (left ventricular hypertrophy)    a. 11/2017 Echo: EF 50-55%. triv AI. Mild MR. Mod L and mild R atrial enlargement. Nl RV size/fxn. Small pericardial effusion w/o tampondae; b. 03/2018 Echo: EF 55-60%, restrictive filling pattern. Nl RV size/fxn. Sev LAE. Mild MR, mild to mod TR/PR. Mod PAH. Triv effusion.   Migraines    Mixed connective tissue disease ([place])    Previous Dialysis patient [facility])    a. Off HD since late 2020.   Septic prepatellar bursitis of right knee 08/19/2017    Past Surgical History:  Procedure Laterality Date   GRAFT APPLICATION Left 3/81/0175   Procedure: BRACHIAL CEPHALIC GRAFT CREATION;  Surgeon: Algernon Huxley, MD;  Location: [facility] ORS;  Service: Vascular;  Laterality: Left;   I & D EXTREMITY Right 08/06/2017   Procedure: IRRIGATION AND DEBRIDEMENT KNEE;  Surgeon: Shona Needles, MD;  Location: [place];  Service: Orthopedics;  Laterality: Right;   MULTIPLE TOOTH EXTRACTIONS      Allergies: Lisinopril, Ativan [lorazepam], and Vicodin [hydrocodone-acetaminophen]  Medications: Prior to Admission medications   Medication Sig Start Date End Date Taking? Authorizing Provider  amLODipine (NORVASC) 10 MG tablet Take 10 mg by mouth daily. 08/05/21   [provider]  B Complex-C (B-COMPLEX WITH VITAMIN C) tablet Take 1 tablet by mouth daily.  [provider]  busPIRone (BUSPAR) 7.5 MG tablet Take 1 tablet (7.5 mg total) by mouth once as needed for up to 1 dose (1 hr before flight). Patient not taking: Reported on 02/23/2020 11/03/19   Caroline More, DO  calcitRIOL (ROCALTROL) 0.25 MCG capsule Take 0.25 mcg by mouth daily. 08/01/21   [provider]  carvedilol (COREG) 12.5 MG tablet Take 1 tablet (12.5 mg total) by mouth 2 (two) times daily with a meal. 10/02/21 11/01/21  Nolberto Hanlon, MD   furosemide (LASIX) 40 MG tablet Take 3 tablets (120 mg total) by mouth 2 (two) times daily. 10/02/21 11/01/21  Nolberto Hanlon, MD  hydrALAZINE (APRESOLINE) 100 MG tablet Take 1 tablet (100 mg total) by mouth 3 (three) times daily. 10/02/21 11/01/21  Nolberto Hanlon, MD  losartan (COZAAR) 50 MG tablet Take 1 tablet (50 mg total) by mouth daily. 10/02/21 11/01/21  Nolberto Hanlon, MD  oxyCODONE (ROXICODONE) 15 MG immediate release tablet Take 15 mg by mouth 4 (four) times daily as needed for pain. 09/24/21   [provider]  pantoprazole (PROTONIX) 40 MG tablet Take 1 tablet (40 mg total) by mouth 2 (two) times daily. Patient not taking: Reported on 09/02/2021 01/28/18 09/02/21  Alma Friendly, MD  rifampin (RIFADIN) 300 MG capsule Take 300 mg by mouth 2 (two) times daily. 08/22/21   [provider]  sodium bicarbonate 650 MG tablet Take 1 tablet (650 mg total) by mouth 2 (two) times daily. 09/05/21   Thurnell Lose, MD  SYMBICORT 160-4.5 MCG/ACT inhaler Inhale 1 puff into the lungs daily as needed (For shortness of breath). 07/29/21   [provider]     Family History  Problem Relation Age of Onset   Lupus Mother    Hypertension Mother    Kidney disease Mother    Heart Problems Mother    Coronary artery disease Mother 81       stent   Diabetes Maternal Grandmother     Social History   Socioeconomic History   Marital status: Single    Spouse name: Not on file   Number of children: Not on file   Years of education: Not on file   Highest education level: Not on file  Occupational History   Not on file  Tobacco Use   Smoking status: Every Day    Packs/day: 0.25    Years: 5.00    Total pack years: 1.25    Types: E-cigarettes, Cigarettes    Start date: 02/11/2015   Smokeless tobacco: Never  Vaping Use   Vaping Use: Every day  Substance and Sexual Activity   Alcohol use: No    Alcohol/week: 0.0 standard drinks of alcohol   Drug use: No   Sexual activity: Yes     Partners: Male    Birth control/protection: None  Other Topics Concern   Not on file  Social History Narrative   Lives with boyfriend   Social Determinants of Health   Financial Resource Strain: Not on file  Food Insecurity: Not on file  Transportation Needs: Not on file  Physical Activity: Not on file  Stress: Not on file  Social Connections: Not on file     Review of Systems: A 12 point ROS discussed and pertinent positives are indicated in the HPI above.  All other systems are negative.  Vital Signs: BP (!) 166/84 (BP Location: Left Arm)   Pulse 65   Temp 97.8 F (36.6 C) (Temporal)   Resp 16  Ht 5\' 6"  (1.676 m)   Wt 165 lb (74.8 kg)   LMP 05/04/2022 (Approximate)   SpO2 98%   BMI 26.63 kg/m    Physical Exam Vitals reviewed.  Constitutional:      General: She is not in acute distress.    Appearance: She is not ill-appearing.  HENT:     Head: Normocephalic.     Mouth/Throat:     Mouth: Mucous membranes are moist.     Pharynx: Oropharynx is clear.  Neck:     Comments: + tunneled dialysis catheter on left upper chest  Cardiovascular:     Rate and Rhythm: Normal rate and regular rhythm.     Heart sounds: Normal heart sounds.  Pulmonary:     Effort: Pulmonary effort is normal.     Breath sounds: Normal breath sounds.  Abdominal:     General: Abdomen is flat. Bowel sounds are normal.     Palpations: Abdomen is soft.  Musculoskeletal:     Cervical back: Neck supple.  Skin:    General: Skin is warm and dry.     Coloration: Skin is not jaundiced or pale.     Comments: + LUE AVG  No thrill or bruit  No left arm swelling or TTP   Neurological:     Mental Status: She is alert and oriented to person, place, and time.  Psychiatric:        Judgment: Judgment normal.     Comments: Nervous      MD Evaluation Airway: WNL Heart: WNL Abdomen: WNL Chest/ Lungs: WNL ASA  Classification: 3 Mallampati/Airway Score: Two  Imaging: No results  found.  Labs:  CBC: Recent Labs    09/29/21 1446 09/30/21 0250 10/01/21 0424 11/11/21 1319  WBC 8.1 8.9 7.6 6.6  HGB 11.4* 9.9* 10.9* 10.1*  HCT 35.0* 31.6* 33.0* 32.5*  PLT 195 180 197 161    COAGS: No results for input(s): "INR", "APTT" in the last 8760 hours.  BMP: Recent Labs    09/29/21 1446 09/30/21 0250 10/01/21 0424 11/11/21 1319  NA 137 138 135 138  K 4.0 3.9 3.5 4.4  CL 107 109 101 104  CO2 16* 17* 19* 22  GLUCOSE 80 90 106* 88  BUN 63* 65* 38* 68*  CALCIUM 8.5* 8.3* 8.6* 9.1  CREATININE 6.46* 6.62* 4.71* 7.06*  GFRNONAA 8* 8* 12* 8*    LIVER FUNCTION TESTS: Recent Labs    09/30/21 0250 11/11/21 1319  BILITOT 0.3 0.3  AST 19 12*  ALT 15 5  ALKPHOS 74 78  PROT 7.0 7.5  ALBUMIN 3.3* 3.8    TUMOR MARKERS: No results for input(s): "AFPTM", "CEA", "CA199", "CHROMGRNA" in the last 8760 hours.  Assessment and Plan: 26 y.o. female with ESRD s/p left brachiocephalic AVG creation July 2020 who presents with possibly clotted AVG.  Patient presents to [facility] IR today for LUE AVG shuntogram and possible interventions. N.p.o. since midnight VSS Last dialysis was on 05/14/22  Risks and benefits discussed with the patient including, but not limited to bleeding, infection, vascular injury, pulmonary embolism, need for tunneled HD catheter placement or even death.  All of the patient's questions were answered, patient is agreeable to proceed. Consent signed and in chart.    Thank you for this interesting consult.  I greatly enjoyed meeting Linda [name] and look forward to participating in their care.  A copy of this report was sent to the requesting provider on this date.  Electronically Signed: Beatrice Lecher  H Avalee Castrellon, PA-C 05/18/2022, 9:39 AM   I spent a total of  30 Minutes   in face to face in clinical consultation, greater than 50% of which was counseling/coordinating care for LUE AVG shuntogram with possible intervention.  This chart was dictated using  voice recognition software.  Despite best efforts to proofread,  errors can occur which can change the documentation meaning.

## 2022-05-18 ENCOUNTER — Other Ambulatory Visit: Payer: Self-pay

## 2022-05-18 ENCOUNTER — Encounter (HOSPITAL_COMMUNITY): Payer: Self-pay

## 2022-05-18 ENCOUNTER — Other Ambulatory Visit (HOSPITAL_COMMUNITY): Payer: Self-pay | Admitting: Nephrology

## 2022-05-18 ENCOUNTER — Ambulatory Visit (HOSPITAL_COMMUNITY): Payer: Medicare Other

## 2022-05-18 ENCOUNTER — Ambulatory Visit (HOSPITAL_COMMUNITY)
Admission: RE | Admit: 2022-05-18 | Discharge: 2022-05-18 | Disposition: A | Discharge status: Home / Self Care | Payer: Medicare Other | Source: Ambulatory Visit | Attending: Nephrology | Admitting: Nephrology

## 2022-05-18 VITALS — BP 124/89 | HR 71 | Temp 97.8°F | Resp 13 | Ht 66.0 in | Wt 165.0 lb

## 2022-05-18 DIAGNOSIS — Y841 Kidney dialysis as the cause of abnormal reaction of the patient, or of later complication, without mention of misadventure at the time of the procedure: Secondary | ICD-10-CM | POA: Insufficient documentation

## 2022-05-18 DIAGNOSIS — N186 End stage renal disease: Secondary | ICD-10-CM

## 2022-05-18 DIAGNOSIS — T82858A Stenosis of vascular prosthetic devices, implants and grafts, initial encounter: Secondary | ICD-10-CM | POA: Diagnosis present

## 2022-05-18 DIAGNOSIS — F17210 Nicotine dependence, cigarettes, uncomplicated: Secondary | ICD-10-CM | POA: Insufficient documentation

## 2022-05-18 DIAGNOSIS — I120 Hypertensive chronic kidney disease with stage 5 chronic kidney disease or end stage renal disease: Secondary | ICD-10-CM | POA: Diagnosis not present

## 2022-05-18 DIAGNOSIS — Z992 Dependence on renal dialysis: Secondary | ICD-10-CM | POA: Diagnosis not present

## 2022-05-18 DIAGNOSIS — M329 Systemic lupus erythematosus, unspecified: Secondary | ICD-10-CM | POA: Diagnosis not present

## 2022-05-18 HISTORY — PX: IR US GUIDE VASC ACCESS LEFT: IMG2389

## 2022-05-18 HISTORY — PX: IR THROMBECTOMY AV FISTULA W/THROMBOLYSIS/PTA/STENT INC/SHUNT/IMG LT: IMG6107

## 2022-05-18 LAB — HCG, SERUM, QUALITATIVE: Preg, Serum: NEGATIVE

## 2022-05-18 MED ORDER — FENTANYL CITRATE (PF) 100 MCG/2ML IJ SOLN
INTRAMUSCULAR | Status: AC
  Filled 2022-05-18: qty 2

## 2022-05-18 MED ORDER — HEPARIN SODIUM (PORCINE) 1000 UNIT/ML IJ SOLN
INTRAMUSCULAR | Status: AC | PRN
  Administered 2022-05-18: 1000 [IU] via INTRAVENOUS
  Administered 2022-05-18: 3000 [IU] via INTRAVENOUS

## 2022-05-18 MED ORDER — LIDOCAINE HCL 1 % IJ SOLN
INTRAMUSCULAR | Status: AC
  Filled 2022-05-18: qty 20

## 2022-05-18 MED ORDER — ALTEPLASE 2 MG IJ SOLR
INTRAMUSCULAR | Status: AC
  Filled 2022-05-18: qty 8

## 2022-05-18 MED ORDER — HEPARIN SODIUM (PORCINE) 1000 UNIT/ML IJ SOLN
INTRAMUSCULAR | Status: AC
  Filled 2022-05-18: qty 10

## 2022-05-18 MED ORDER — MIDAZOLAM HCL 2 MG/2ML IJ SOLN
INTRAMUSCULAR | Status: AC | PRN
Start: 2022-05-18 — End: 2022-05-18
  Administered 2022-05-18 (×6): .5 mg via INTRAVENOUS
  Administered 2022-05-18: 1 mg via INTRAVENOUS

## 2022-05-18 MED ORDER — MIDAZOLAM HCL 2 MG/2ML IJ SOLN
INTRAMUSCULAR | Status: AC
  Filled 2022-05-18: qty 2

## 2022-05-18 MED ORDER — IOHEXOL 300 MG/ML  SOLN
100.0000 mL | Freq: Once | INTRAMUSCULAR | Status: AC | PRN
  Administered 2022-05-18: 20 mL via INTRAVENOUS

## 2022-05-18 MED ORDER — LIDOCAINE HCL (PF) 1 % IJ SOLN
INTRAMUSCULAR | Status: AC | PRN
  Administered 2022-05-18: 5 mL

## 2022-05-18 MED ORDER — ALTEPLASE 30 MG/30 ML FOR INTERV. RAD
INTRA_ARTERIAL | Status: AC | PRN
  Administered 2022-05-18: 8 mg via INTRA_ARTERIAL

## 2022-05-18 MED ORDER — IOHEXOL 300 MG/ML  SOLN
100.0000 mL | Freq: Once | INTRAMUSCULAR | Status: AC | PRN
  Administered 2022-05-18: 80 mL via INTRAVENOUS

## 2022-05-18 MED ORDER — FENTANYL CITRATE (PF) 100 MCG/2ML IJ SOLN
INTRAMUSCULAR | Status: AC | PRN
Start: 2022-05-18 — End: 2022-05-18
  Administered 2022-05-18 (×2): 25 ug via INTRAVENOUS
  Administered 2022-05-18: 50 ug via INTRAVENOUS
  Administered 2022-05-18 (×4): 25 ug via INTRAVENOUS

## 2022-05-18 NOTE — Sedation Documentation (Signed)
8mg  TPA administered by Dr. Denna Haggard.

## 2022-05-18 NOTE — Procedures (Signed)
Interventional Radiology Procedure Note  Date of Procedure: 05/18/2022  Procedure: LUE AVG declot   Findings:  1. LUE AVG declot  2. Pharmacomechanical declot    - TPA 8 mg   - Heparin 4000 units  3. Covera stent placed to treat refractory stenosis at venous anastomosis, [id number]   Complications: No immediate complications noted.   Estimated Blood Loss: minimal  Follow-up and Recommendations: 1. Bedrest 1 hour  2. Access ready for use    Albin Felling, MD  Vascular & Interventional Radiology  05/18/2022 11:44 AM

## 2022-06-29 ENCOUNTER — Encounter (INDEPENDENT_AMBULATORY_CARE_PROVIDER_SITE_OTHER): Payer: Self-pay

## 2022-07-15 ENCOUNTER — Other Ambulatory Visit (HOSPITAL_COMMUNITY)
Admission: RE | Admit: 2022-07-15 | Discharge: 2022-07-15 | Disposition: A | Discharge status: Home / Self Care | Payer: Medicare Other | Source: Ambulatory Visit | Attending: Obstetrics & Gynecology | Admitting: Obstetrics & Gynecology

## 2022-07-15 ENCOUNTER — Ambulatory Visit (INDEPENDENT_AMBULATORY_CARE_PROVIDER_SITE_OTHER): Payer: Medicare Other | Admitting: General Practice

## 2022-07-15 VITALS — BP 180/122 | HR 71 | Ht 66.0 in | Wt 167.5 lb

## 2022-07-15 DIAGNOSIS — Z113 Encounter for screening for infections with a predominantly sexual mode of transmission: Secondary | ICD-10-CM | POA: Diagnosis not present

## 2022-07-15 DIAGNOSIS — B3731 Acute candidiasis of vulva and vagina: Secondary | ICD-10-CM

## 2022-07-15 DIAGNOSIS — Z114 Encounter for screening for human immunodeficiency virus [HIV]: Secondary | ICD-10-CM

## 2022-07-15 DIAGNOSIS — Z7251 High risk heterosexual behavior: Secondary | ICD-10-CM

## 2022-07-15 LAB — POCT URINE PREGNANCY: Preg Test, Ur: NEGATIVE

## 2022-07-15 NOTE — Progress Notes (Signed)
SUBJECTIVE:  26 y.o. female presents for STD testing. Denies abnormal vaginal bleeding or significant pelvic pain or fever. No UTI symptoms. Denies history of known exposure to STD.  No LMP recorded.  OBJECTIVE:  She appears well, afebrile. Urine dipstick: not done.  ASSESSMENT:  Vaginal Discharge  Vaginal Odor   PLAN:  GC, chlamydia, trichomonas, BVAG, CVAG probe sent to lab. Return in 2 weeks for UPT/ Depo restart Treatment: To be determined once lab results are received ROV prn if symptoms persist or worsen.

## 2022-07-16 LAB — CERVICOVAGINAL ANCILLARY ONLY
Bacterial Vaginitis (gardnerella): NEGATIVE
Candida Glabrata: NEGATIVE
Candida Vaginitis: POSITIVE — AB
Chlamydia: NEGATIVE
Comment: NEGATIVE
Comment: NEGATIVE
Comment: NEGATIVE
Comment: NEGATIVE
Comment: NEGATIVE
Comment: NORMAL
Neisseria Gonorrhea: NEGATIVE
Trichomonas: NEGATIVE

## 2022-07-16 LAB — RPR: RPR Ser Ql: NONREACTIVE

## 2022-07-16 LAB — HIV ANTIBODY (ROUTINE TESTING W REFLEX): HIV Screen 4th Generation wRfx: NONREACTIVE

## 2022-07-16 LAB — HEPATITIS C ANTIBODY: Hep C Virus Ab: NONREACTIVE

## 2022-07-16 LAB — HEPATITIS B SURFACE ANTIGEN: Hepatitis B Surface Ag: NEGATIVE

## 2022-07-18 MED ORDER — FLUCONAZOLE 150 MG PO TABS: 150.0000 mg | ORAL_TABLET | Freq: Once | ORAL | 3 refills | Status: AC

## 2022-07-18 NOTE — Addendum Note (Signed)
Addended by: Verita Schneiders A on: 07/18/2022 11:35 AM   Modules accepted: Orders

## 2022-07-29 ENCOUNTER — Other Ambulatory Visit: Payer: Self-pay

## 2022-07-29 ENCOUNTER — Ambulatory Visit: Payer: Medicare Other

## 2022-07-29 ENCOUNTER — Telehealth: Payer: Self-pay

## 2022-07-29 NOTE — Telephone Encounter (Signed)
Discussed depo restart with Dr. Rip Harbour due to patient not having AEX or being on depo in over a year. He states that it is okay to give patient depo with a negative UPT and no sexual activity within the last two weeks. States to make sure patient gets scheduled for an AEX when she comes in for appt tomorrow.  Patient made aware of the need for AEX and to schedule appt before she leaves tomorrow.

## 2022-07-30 ENCOUNTER — Ambulatory Visit (INDEPENDENT_AMBULATORY_CARE_PROVIDER_SITE_OTHER): Payer: Medicare Other | Admitting: *Deleted

## 2022-07-30 VITALS — BP 120/79 | HR 76

## 2022-07-30 DIAGNOSIS — Z3042 Encounter for surveillance of injectable contraceptive: Secondary | ICD-10-CM | POA: Diagnosis not present

## 2022-07-30 LAB — POCT URINE PREGNANCY: Preg Test, Ur: NEGATIVE

## 2022-07-30 MED ORDER — MEDROXYPROGESTERONE ACETATE 150 MG/ML IM SUSP
150.0000 mg | Freq: Once | INTRAMUSCULAR | Status: AC
  Administered 2022-07-30: 150 mg via INTRAMUSCULAR

## 2022-07-30 NOTE — Progress Notes (Signed)
Date last pap: 04/08/21. Last Depo-Provera: 04/08/21. Side Effects if any: NA. Serum HCG indicated? Neg. Depo-Provera 150 mg IM given by: Lillia Abed. Daquon Greenleaf, RNC in [place] glu. Next appointment due 10/15/22-10/30/22.  Pt for Depo Restart. Negative UPT X 2. Has not seen provider in the office since 04/08/21. Per Dr. Rip Harbour on 07/29/22 OK to restart Depo and then schedule annual exam. Pt with ESRD. Need for contraceptive outweighs risk. Annual exam scheduled at check out.

## 2022-08-12 ENCOUNTER — Emergency Department (HOSPITAL_COMMUNITY): Payer: Medicare Other

## 2022-08-12 ENCOUNTER — Emergency Department (HOSPITAL_COMMUNITY)
Admission: EM | Admit: 2022-08-12 | Discharge: 2022-08-12 | Disposition: A | Discharge status: Home / Self Care | Payer: Medicare Other | Attending: Emergency Medicine | Admitting: Emergency Medicine

## 2022-08-12 DIAGNOSIS — Z8739 Personal history of other diseases of the musculoskeletal system and connective tissue: Secondary | ICD-10-CM | POA: Diagnosis not present

## 2022-08-12 DIAGNOSIS — M25561 Pain in right knee: Secondary | ICD-10-CM | POA: Diagnosis not present

## 2022-08-12 DIAGNOSIS — N186 End stage renal disease: Secondary | ICD-10-CM | POA: Diagnosis not present

## 2022-08-12 DIAGNOSIS — R1084 Generalized abdominal pain: Secondary | ICD-10-CM | POA: Diagnosis present

## 2022-08-12 DIAGNOSIS — Z992 Dependence on renal dialysis: Secondary | ICD-10-CM | POA: Diagnosis not present

## 2022-08-12 DIAGNOSIS — Z1152 Encounter for screening for COVID-19: Secondary | ICD-10-CM | POA: Diagnosis not present

## 2022-08-12 DIAGNOSIS — M25562 Pain in left knee: Secondary | ICD-10-CM | POA: Insufficient documentation

## 2022-08-12 DIAGNOSIS — M329 Systemic lupus erythematosus, unspecified: Secondary | ICD-10-CM

## 2022-08-12 DIAGNOSIS — R509 Fever, unspecified: Secondary | ICD-10-CM | POA: Diagnosis not present

## 2022-08-12 LAB — RESP PANEL BY RT-PCR (RSV, FLU A&B, COVID)  RVPGX2
Influenza A by PCR: NEGATIVE
Influenza B by PCR: NEGATIVE
Resp Syncytial Virus by PCR: NEGATIVE
SARS Coronavirus 2 by RT PCR: NEGATIVE

## 2022-08-12 LAB — CBC WITH DIFFERENTIAL/PLATELET
Abs Immature Granulocytes: 0.06 10*3/uL (ref 0.00–0.07)
Basophils Absolute: 0 10*3/uL (ref 0.0–0.1)
Basophils Relative: 0 %
Eosinophils Absolute: 0.2 10*3/uL (ref 0.0–0.5)
Eosinophils Relative: 2 %
HCT: 38.1 % (ref 36.0–46.0)
Hemoglobin: 12.3 g/dL (ref 12.0–15.0)
Immature Granulocytes: 1 %
Lymphocytes Relative: 17 %
Lymphs Abs: 1.9 10*3/uL (ref 0.7–4.0)
MCH: 32.2 pg (ref 26.0–34.0)
MCHC: 32.3 g/dL (ref 30.0–36.0)
MCV: 99.7 fL (ref 80.0–100.0)
Monocytes Absolute: 1.3 10*3/uL — ABNORMAL HIGH (ref 0.1–1.0)
Monocytes Relative: 12 %
Neutro Abs: 7.8 10*3/uL — ABNORMAL HIGH (ref 1.7–7.7)
Neutrophils Relative %: 68 %
Platelets: 302 10*3/uL (ref 150–400)
RBC: 3.82 MIL/uL — ABNORMAL LOW (ref 3.87–5.11)
RDW: 13.8 % (ref 11.5–15.5)
WBC: 11.3 10*3/uL — ABNORMAL HIGH (ref 4.0–10.5)
nRBC: 0 % (ref 0.0–0.2)

## 2022-08-12 LAB — LIPASE, BLOOD: Lipase: 39 U/L (ref 11–51)

## 2022-08-12 LAB — COMPREHENSIVE METABOLIC PANEL
ALT: 11 U/L (ref 0–44)
AST: 18 U/L (ref 15–41)
Albumin: 3.2 g/dL — ABNORMAL LOW (ref 3.5–5.0)
Alkaline Phosphatase: 68 U/L (ref 38–126)
Anion gap: 20 — ABNORMAL HIGH (ref 5–15)
BUN: 64 mg/dL — ABNORMAL HIGH (ref 6–20)
CO2: 17 mmol/L — ABNORMAL LOW (ref 22–32)
Calcium: 10.4 mg/dL — ABNORMAL HIGH (ref 8.9–10.3)
Chloride: 99 mmol/L (ref 98–111)
Creatinine, Ser: 11.82 mg/dL — ABNORMAL HIGH (ref 0.44–1.00)
GFR, Estimated: 4 mL/min — ABNORMAL LOW (ref >60)
Glucose, Bld: 95 mg/dL (ref 70–99)
Potassium: 4.6 mmol/L (ref 3.5–5.1)
Sodium: 136 mmol/L (ref 135–145)
Total Bilirubin: 0.6 mg/dL (ref 0.3–1.2)
Total Protein: 8.9 g/dL — ABNORMAL HIGH (ref 6.5–8.1)

## 2022-08-12 LAB — ETHANOL: Alcohol, Ethyl (B): <10 mg/dL (ref <10)

## 2022-08-12 MED ORDER — ONDANSETRON 4 MG PO TBDP: 8.0000 mg | ORAL_TABLET | Freq: Once | ORAL | Status: DC

## 2022-08-12 MED ORDER — OXYCODONE HCL 15 MG PO TABS: 15.0000 mg | ORAL_TABLET | Freq: Three times a day (TID) | ORAL | 0 refills | Status: DC | PRN

## 2022-08-12 MED ORDER — ACETAMINOPHEN 325 MG PO TABS
650.0000 mg | ORAL_TABLET | Freq: Once | ORAL | Status: DC
  Filled 2022-08-12: qty 2

## 2022-08-12 MED ORDER — PROMETHAZINE HCL 25 MG/ML IJ SOLN
25.0000 mg | Freq: Once | INTRAMUSCULAR | Status: AC
  Administered 2022-08-12: 25 mg via INTRAMUSCULAR
  Filled 2022-08-12: qty 1

## 2022-08-12 NOTE — ED Provider Triage Note (Signed)
Emergency Medicine Provider Triage Evaluation Note  Linda [name] , a 26 y.o. female  was evaluated in triage.  Pt complains of n/v, cough, missed HD.  Review of Systems  Positive: hpi Negative: CP  Physical Exam  There were no vitals taken for this visit. Gen:   Awake, no distress  uncomfortable appearing Resp:  Normal effort, cough MSK:   Moves extremities without difficulty , no deformity Other:  Neuro grossly unremarkable  Medical Decision Making  Medically screening exam initiated at 8:19 AM.  Appropriate orders placed.  Linda [name] was informed that the remainder of the evaluation will be completed by another provider, this initial triage assessment does not replace that evaluation, and the importance of remaining in the ED until their evaluation is complete.   Carmin Muskrat, MD 08/12/22 [phone]

## 2022-08-12 NOTE — ED Provider Notes (Signed)
[facility] EMERGENCY DEPARTMENT Provider Note   CSN: [id number] Arrival date & time: 08/12/22  2094     History  Chief Complaint  Patient presents with   Abdominal Pain   Emesis    Linda [name] is a 26 y.o. female.  The history is provided by the patient and medical records. No language interpreter was used.  Abdominal Pain Associated symptoms: vomiting   Emesis Associated symptoms: abdominal pain      26 year old female with family history of end-stage renal disease in the setting of lupus and is currently on dialysis, brought here via EMS from home with complaint of abdominal pain.  Patient is here with complaints of diffuse abdominal pain, feeling nauseous, and having diarrhea.  Symptoms started earlier today.  She also complaining of bilateral knee pain that felt similar to prior lupus flare.  She mention she has missed her last 2 dialysis appointment due to lack of transportation and overall she feels bad.  Patient endorsed objective fever.  She does not complain of chest pain shortness of breath or productive cough.  No congestion.  She still makes urine and denies any urinary symptoms.  She reports she recently got kicked out from the house and currently staying with friends but having difficulty getting to dialysis center.  Home Medications Prior to Admission medications   Medication Sig Start Date End Date Taking? Authorizing Provider  amLODipine (NORVASC) 10 MG tablet Take 10 mg by mouth daily. 08/05/21   [provider]  B Complex-C (B-COMPLEX WITH VITAMIN C) tablet Take 1 tablet by mouth daily. Patient not taking: Reported on 07/15/2022    [provider]  busPIRone (BUSPAR) 7.5 MG tablet Take 1 tablet (7.5 mg total) by mouth once as needed for up to 1 dose (1 hr before flight). Patient not taking: Reported on 02/23/2020 11/03/19   Caroline More, DO  calcitRIOL (ROCALTROL) 0.25 MCG capsule Take 0.25 mcg by mouth daily. Patient  not taking: Reported on 07/15/2022 08/01/21   [provider]  carvedilol (COREG) 12.5 MG tablet Take 1 tablet (12.5 mg total) by mouth 2 (two) times daily with a meal. 10/02/21 05/18/22  Nolberto Hanlon, MD  furosemide (LASIX) 40 MG tablet Take 3 tablets (120 mg total) by mouth 2 (two) times daily. 10/02/21 05/18/22  Nolberto Hanlon, MD  hydrALAZINE (APRESOLINE) 100 MG tablet Take 1 tablet (100 mg total) by mouth 3 (three) times daily. 10/02/21 11/01/21  Nolberto Hanlon, MD  losartan (COZAAR) 50 MG tablet Take 1 tablet (50 mg total) by mouth daily. 10/02/21 05/18/22  Nolberto Hanlon, MD  oxyCODONE (ROXICODONE) 15 MG immediate release tablet Take 15 mg by mouth 4 (four) times daily as needed for pain. 09/24/21   [provider]  pantoprazole (PROTONIX) 40 MG tablet Take 1 tablet (40 mg total) by mouth 2 (two) times daily. Patient not taking: Reported on 09/02/2021 01/28/18 09/02/21  Alma Friendly, MD  rifampin (RIFADIN) 300 MG capsule Take 300 mg by mouth 2 (two) times daily. 08/22/21   [provider]  sodium bicarbonate 650 MG tablet Take 1 tablet (650 mg total) by mouth 2 (two) times daily. 09/05/21   Thurnell Lose, MD  SYMBICORT 160-4.5 MCG/ACT inhaler Inhale 1 puff into the lungs daily as needed (For shortness of breath). 07/29/21   [provider]      Allergies    Lisinopril, Ativan [lorazepam], and Vicodin [hydrocodone-acetaminophen]    Review of Systems   Review of Systems  Gastrointestinal:  Positive for abdominal pain and vomiting.  All other systems reviewed and are negative.   Physical Exam Updated Vital Signs BP 111/73   Pulse 60   Temp 98 F (36.7 C) (Oral)   Resp 16   SpO2 96%  Physical Exam Vitals and nursing note reviewed.  Constitutional:      General: She is not in acute distress.    Appearance: She is well-developed.  HENT:     Head: Atraumatic.  Eyes:     Conjunctiva/sclera: Conjunctivae normal.  Cardiovascular:     Rate and Rhythm: Normal  rate and regular rhythm.  Pulmonary:     Effort: Pulmonary effort is normal.  Abdominal:     Palpations: Abdomen is soft.     Tenderness: There is generalized abdominal tenderness.  Musculoskeletal:        General: Tenderness (Patient has tenderness to bilateral knees with overlying scar tissues without any obvious cellulitis and she is able to flex and extend her knee.  No deformity) present.     Cervical back: Neck supple.  Skin:    Findings: No rash.  Neurological:     Mental Status: She is alert and oriented to person, place, and time.  Psychiatric:        Mood and Affect: Mood normal.     ED Results / Procedures / Treatments   Labs (all labs ordered are listed, but only abnormal results are displayed) Labs Reviewed  COMPREHENSIVE METABOLIC PANEL - Abnormal; Notable for the following components:      Result Value   CO2 17 (*)    BUN 64 (*)    Creatinine, Ser 11.82 (*)    Calcium 10.4 (*)    Total Protein 8.9 (*)    Albumin 3.2 (*)    GFR, Estimated 4 (*)    Anion gap 20 (*)    All other components within normal limits  CBC WITH DIFFERENTIAL/PLATELET - Abnormal; Notable for the following components:   WBC 11.3 (*)    RBC 3.82 (*)    Neutro Abs 7.8 (*)    Monocytes Absolute 1.3 (*)    All other components within normal limits  RESP PANEL BY RT-PCR (RSV, FLU A&B, COVID)  RVPGX2  ETHANOL  LIPASE, BLOOD    EKG EKG Interpretation  Date/Time:  Wednesday August 12 2022 08:24:52 EST Ventricular Rate:  68 PR Interval:  154 QRS Duration: 94 QT Interval:  446 QTC Calculation: 474 R Axis:   16 Text Interpretation: Normal sinus rhythm RSR' or QR pattern in V1 suggests right ventricular conduction delay Septal infarct , age undetermined Abnormal ECG Confirmed by Carmin Muskrat [phone]) on 08/12/2022 8:28:41 AM  Radiology CT ABDOMEN PELVIS WO CONTRAST  Result Date: 08/12/2022 CLINICAL DATA:  Acute abdominal pain. EXAM: CT ABDOMEN AND PELVIS WITHOUT CONTRAST  TECHNIQUE: Multidetector CT imaging of the abdomen and pelvis was performed following the standard protocol without IV contrast. RADIATION DOSE REDUCTION: This exam was performed according to the departmental dose-optimization program which includes automated exposure control, adjustment of the mA and/or kV according to patient size and/or use of iterative reconstruction technique. COMPARISON:  08/22/2018. FINDINGS: Lower chest: Minimal dependent atelectasis in the right lower lobe. Heart is enlarged. No pericardial or pleural effusion. Distal esophagus is grossly unremarkable. Hepatobiliary: Liver is enlarged, 20.6 cm. Liver and gallbladder are otherwise unremarkable. No biliary ductal dilatation. Pancreas: Negative. Spleen: Negative. Adrenals/Urinary Tract: Right adrenal gland is unremarkable. There may be a 1.5 cm nodule in the left  adrenal gland, measuring -5 Hounsfield units. No follow-up necessary. Kidneys are unremarkable. No urinary stones. Bladder is grossly unremarkable. Stomach/Bowel: Stomach, small bowel, appendix and colon are unremarkable. Vascular/Lymphatic: Minimal atherosclerotic calcification of the aorta. Small to borderline enlarged inguinal lymph nodes, up to 11 mm on the right, nonspecific. Reproductive: Uterus is visualized.  No adnexal mass. Other: Small pelvic free fluid. Mesenteries and peritoneum are otherwise unremarkable. Musculoskeletal: No worrisome lytic or sclerotic lesions. Bilateral L5 pars defects without alignment abnormality. IMPRESSION: 1. No acute findings to explain the patient's pain. 2. Small pelvic free fluid. 3. Hepatic steatosis. 4. Possible left adrenal adenoma. 5.  Aortic atherosclerosis (ICD10-I70.0). Electronically Signed   By: Lorin Picket M.D.   On: 08/12/2022 16:28    Procedures Procedures    Medications Ordered in ED Medications  acetaminophen (TYLENOL) tablet 650 mg (has no administration in time range)  promethazine (PHENERGAN) injection 25 mg (25  mg Intramuscular Given 08/12/22 0834)    ED Course/ Medical Decision Making/ A&P                           Medical Decision Making Amount and/or Complexity of Data Reviewed Radiology: ordered.  Risk OTC drugs.   BP 111/73   Pulse 60   Temp 98 F (36.7 C) (Oral)   Resp 16   SpO2 96%   79:46 PM  26 year old female with family history of end-stage renal disease in the setting of lupus and is currently on dialysis, brought here via EMS from home with complaint of abdominal pain.  Patient is here with complaints of diffuse abdominal pain, feeling nauseous, and having diarrhea.  Symptoms started earlier today.  She also complaining of bilateral knee pain that felt similar to prior lupus flare.  She mention she has missed her last 2 dialysis appointment due to lack of transportation and overall she feels bad.  Patient endorsed objective fever.  She does not complain of chest pain shortness of breath or productive cough.  No congestion.  She still makes urine and denies any urinary symptoms.  She reports she recently got kicked out from the house and currently staying with friends but having difficulty getting to dialysis center.  On exam, patient is laying in bed appears uncomfortable.  Heart with normal rate and rhythm, lungs are clear to auscultation, abdomen is diffusely tender but otherwise soft with present bowel sounds.  Bilateral knees are tender to palpation with some cutaneous chronic skin changes likely relating to her lupus.  She is able to flex and extend her knees I have low suspicion for septic joint or cellulitis.  She is neurovascular intact  Vital sign overall reassuring.  She is afebrile, no hypoxia.  -Labs ordered, independently viewed and interpreted by me.  Labs remarkable for impaired renal function, as expected due to ESRD on dialysis.  Normal K+ of 4.6.  WBC mildly elevated at 11.3.  normal viral resp panel.  -The patient was maintained on a cardiac monitor.  I personally  viewed and interpreted the cardiac monitored which showed an underlying rhythm of: NSR -Imaging independently viewed and interpreted by me and I agree with radiologist's interpretation.  Result remarkable for abd/pelvis CT without acute pathology -This patient presents to the ED for concern of abd pain, this involves an extensive number of treatment options, and is a complaint that carries with it a high risk of complications and morbidity.  The differential diagnosis includes appendicitis, colitis, diverticulitis, pancreatitis, cholecystitis,  UTI, lupus -Co morbidities that complicate the patient evaluation includes lupus -Treatment includes tylenol -Reevaluation of the patient after these medicines showed that the patient improved -PCP office notes or outside notes reviewed -Escalation to admission/observation considered: patients feels much better, is comfortable with discharge, and will follow up with PCP -Prescription medication considered, patient comfortable with a short course of opiate medication and also outpt f/u with GI per request -Social Determinant of Health considered which includes tobacco use, recommend cessation  5:25 PM Workup today without any concerning finding.  Although patient reported she missed her last dialysis session, at this time patient does not appear to be fluid overload, she has normal potassium, she does not need emergent dialysis.  Encourage patient to continue with her dialysis schedule.  Patient also request for a GI follow-up as she has discomfort with eating.  CT scan today did not demonstrate any findings to suggest cholecystitis.  I will give patient GI outpatient follow-up.  I have also agreed to provide patient with a short course of opiate pain medication for symptom control as her pain may be related to early this plan.  She will need to follow-up with her doctor for further return precaution given.  Patient voiced understanding and agrees with plan.  Upon  reviewing the PDMP, patient does receive chronic opiates from another provider therefore I will avoid adding any additional opiate pain medication.         Final Clinical Impression(s) / ED Diagnoses Final diagnoses:  Generalized abdominal pain  Hx of systemic lupus erythematosus (SLE) ([place])    Rx / DC Orders ED Discharge Orders          Ordered    oxyCODONE (ROXICODONE) 15 MG immediate release tablet  Every 8 hours PRN,   Status:  Discontinued        08/12/22 1728              Domenic Moras, PA-C 08/12/22 1733    Regan Lemming, MD 08/13/22 1610

## 2022-08-12 NOTE — Discharge Instructions (Signed)
You have been evaluated for your symptoms.  Fortunately today your labs are reassuring.  Please continue with your current dialysis schedule.  You may follow-up with GI specialist for further evaluation of your abdominal discomfort.

## 2022-08-12 NOTE — ED Triage Notes (Signed)
Pt arrives via EMS from home with n/v and mid abd pain for 4 hours. Pt due for dialysis today, states she missed a treatment 2 weeks ago

## 2022-09-09 ENCOUNTER — Ambulatory Visit: Payer: Medicare Other | Admitting: Obstetrics

## 2022-10-26 ENCOUNTER — Ambulatory Visit: Payer: Medicare Other

## 2022-10-29 ENCOUNTER — Ambulatory Visit: Payer: Medicare Other | Admitting: Obstetrics

## 2022-10-29 ENCOUNTER — Other Ambulatory Visit: Payer: Self-pay

## 2022-10-29 MED ORDER — MEDROXYPROGESTERONE ACETATE 150 MG/ML IM SUSP: 150.0000 mg | INTRAMUSCULAR | 0 refills | Status: DC

## 2022-10-30 ENCOUNTER — Ambulatory Visit: Payer: Medicare Other

## 2022-11-30 DIAGNOSIS — N2581 Secondary hyperparathyroidism of renal origin: Secondary | ICD-10-CM | POA: Diagnosis not present

## 2022-11-30 DIAGNOSIS — Z992 Dependence on renal dialysis: Secondary | ICD-10-CM | POA: Diagnosis not present

## 2022-11-30 DIAGNOSIS — D631 Anemia in chronic kidney disease: Secondary | ICD-10-CM | POA: Diagnosis not present

## 2022-11-30 DIAGNOSIS — N186 End stage renal disease: Secondary | ICD-10-CM | POA: Diagnosis not present

## 2022-11-30 DIAGNOSIS — D509 Iron deficiency anemia, unspecified: Secondary | ICD-10-CM | POA: Diagnosis not present

## 2022-12-01 ENCOUNTER — Ambulatory Visit: Payer: Medicare Other | Admitting: Obstetrics

## 2022-12-02 DIAGNOSIS — N186 End stage renal disease: Secondary | ICD-10-CM | POA: Diagnosis not present

## 2022-12-02 DIAGNOSIS — D631 Anemia in chronic kidney disease: Secondary | ICD-10-CM | POA: Diagnosis not present

## 2022-12-02 DIAGNOSIS — D509 Iron deficiency anemia, unspecified: Secondary | ICD-10-CM | POA: Diagnosis not present

## 2022-12-02 DIAGNOSIS — N2581 Secondary hyperparathyroidism of renal origin: Secondary | ICD-10-CM | POA: Diagnosis not present

## 2022-12-02 DIAGNOSIS — Z992 Dependence on renal dialysis: Secondary | ICD-10-CM | POA: Diagnosis not present

## 2022-12-07 DIAGNOSIS — N186 End stage renal disease: Secondary | ICD-10-CM | POA: Diagnosis not present

## 2022-12-07 DIAGNOSIS — D631 Anemia in chronic kidney disease: Secondary | ICD-10-CM | POA: Diagnosis not present

## 2022-12-07 DIAGNOSIS — D509 Iron deficiency anemia, unspecified: Secondary | ICD-10-CM | POA: Diagnosis not present

## 2022-12-07 DIAGNOSIS — Z992 Dependence on renal dialysis: Secondary | ICD-10-CM | POA: Diagnosis not present

## 2022-12-07 DIAGNOSIS — N2581 Secondary hyperparathyroidism of renal origin: Secondary | ICD-10-CM | POA: Diagnosis not present

## 2022-12-09 DIAGNOSIS — Z992 Dependence on renal dialysis: Secondary | ICD-10-CM | POA: Diagnosis not present

## 2022-12-09 DIAGNOSIS — N2581 Secondary hyperparathyroidism of renal origin: Secondary | ICD-10-CM | POA: Diagnosis not present

## 2022-12-09 DIAGNOSIS — D509 Iron deficiency anemia, unspecified: Secondary | ICD-10-CM | POA: Diagnosis not present

## 2022-12-09 DIAGNOSIS — N186 End stage renal disease: Secondary | ICD-10-CM | POA: Diagnosis not present

## 2022-12-09 DIAGNOSIS — D631 Anemia in chronic kidney disease: Secondary | ICD-10-CM | POA: Diagnosis not present

## 2022-12-11 DIAGNOSIS — G629 Polyneuropathy, unspecified: Secondary | ICD-10-CM | POA: Diagnosis not present

## 2022-12-11 DIAGNOSIS — M329 Systemic lupus erythematosus, unspecified: Secondary | ICD-10-CM | POA: Diagnosis not present

## 2022-12-11 DIAGNOSIS — Z0000 Encounter for general adult medical examination without abnormal findings: Secondary | ICD-10-CM | POA: Diagnosis not present

## 2022-12-11 DIAGNOSIS — G4700 Insomnia, unspecified: Secondary | ICD-10-CM | POA: Diagnosis not present

## 2022-12-11 DIAGNOSIS — G894 Chronic pain syndrome: Secondary | ICD-10-CM | POA: Diagnosis not present

## 2022-12-11 DIAGNOSIS — Z79899 Other long term (current) drug therapy: Secondary | ICD-10-CM | POA: Diagnosis not present

## 2022-12-14 DIAGNOSIS — N186 End stage renal disease: Secondary | ICD-10-CM | POA: Diagnosis not present

## 2022-12-14 DIAGNOSIS — N2581 Secondary hyperparathyroidism of renal origin: Secondary | ICD-10-CM | POA: Diagnosis not present

## 2022-12-14 DIAGNOSIS — D631 Anemia in chronic kidney disease: Secondary | ICD-10-CM | POA: Diagnosis not present

## 2022-12-14 DIAGNOSIS — Z992 Dependence on renal dialysis: Secondary | ICD-10-CM | POA: Diagnosis not present

## 2022-12-14 DIAGNOSIS — D509 Iron deficiency anemia, unspecified: Secondary | ICD-10-CM | POA: Diagnosis not present

## 2022-12-15 ENCOUNTER — Encounter: Payer: Self-pay | Admitting: Podiatry

## 2022-12-15 ENCOUNTER — Telehealth: Payer: Self-pay | Admitting: Podiatry

## 2022-12-15 ENCOUNTER — Ambulatory Visit (INDEPENDENT_AMBULATORY_CARE_PROVIDER_SITE_OTHER): Payer: 59

## 2022-12-15 ENCOUNTER — Ambulatory Visit (INDEPENDENT_AMBULATORY_CARE_PROVIDER_SITE_OTHER): Payer: 59 | Admitting: Podiatry

## 2022-12-15 DIAGNOSIS — M351 Other overlap syndromes: Secondary | ICD-10-CM

## 2022-12-15 DIAGNOSIS — L84 Corns and callosities: Secondary | ICD-10-CM | POA: Diagnosis not present

## 2022-12-15 DIAGNOSIS — S91331A Puncture wound without foreign body, right foot, initial encounter: Secondary | ICD-10-CM

## 2022-12-15 DIAGNOSIS — M79671 Pain in right foot: Secondary | ICD-10-CM | POA: Diagnosis not present

## 2022-12-15 DIAGNOSIS — N186 End stage renal disease: Secondary | ICD-10-CM

## 2022-12-15 DIAGNOSIS — Z992 Dependence on renal dialysis: Secondary | ICD-10-CM

## 2022-12-15 NOTE — Telephone Encounter (Signed)
Pt thinks she left her keys in our office. They have a pink and black lanyard on them. If found please call pt.

## 2022-12-15 NOTE — Patient Instructions (Signed)
Aperture or foam/felt callus pads can help with this, can be bought on [employer]

## 2022-12-16 ENCOUNTER — Encounter: Payer: Self-pay | Admitting: Podiatry

## 2022-12-16 DIAGNOSIS — Z992 Dependence on renal dialysis: Secondary | ICD-10-CM | POA: Diagnosis not present

## 2022-12-16 DIAGNOSIS — N186 End stage renal disease: Secondary | ICD-10-CM | POA: Diagnosis not present

## 2022-12-16 DIAGNOSIS — D631 Anemia in chronic kidney disease: Secondary | ICD-10-CM | POA: Diagnosis not present

## 2022-12-16 DIAGNOSIS — D509 Iron deficiency anemia, unspecified: Secondary | ICD-10-CM | POA: Diagnosis not present

## 2022-12-16 DIAGNOSIS — N2581 Secondary hyperparathyroidism of renal origin: Secondary | ICD-10-CM | POA: Diagnosis not present

## 2022-12-16 LAB — WOUND CULTURE: MICRO NUMBER:: 14831407

## 2022-12-16 NOTE — Progress Notes (Signed)
  Subjective:  Patient ID: Linda [name], female    DOB: 09-19-1995,  MRN: [id number]  Chief Complaint  Patient presents with   Callouses    (np) Painful callus on right foot, 5th submet. ESRD on dialysis    27 y.o. female presents with the above complaint. History confirmed with patient.  She has a history of calciphylaxis, she is on dialysis, she notes a painful lesion on the bottom of the right foot on the outside near the fifth toe.  She thinks she may have stepped on a piece of glass a while back.  Objective:  Physical Exam: warm, good capillary refill, no trophic changes or ulcerative lesions, normal DP and PT pulses, normal sensory exam, and painful callus lesion, debridement of this reveals small draining sinus with white friable tissue.  Right Foot: Right foot hyperkeratotic lesion, draining central sinus tract with friable white tissue    Radiographs: Multiple views x-ray of the right foot: Multiple soft tissue calcifications, several noted in the area of concern under the fifth metatarsal Assessment:   1. Calciphylaxis   2. Callus of foot   3. ESRD on dialysis   4. Mixed connective tissue disease   5. Puncture wound of right foot, initial encounter      Plan:  Patient was evaluated and treated and all questions answered.  I debrided the lesion this revealed a small sinus tract that did not have any signs of infection but did have some friable white soft tissue that was able to be retrieved from it.  I do think this is a calciphylaxis lesion.  We discussed offloading with a aperture pad.  We discussed etiology of calciphylaxis and treatment options, we discussed that unfortunately there often is not a cure or fix for calciphylaxis and this may be a lifelong issue.  Currently there are no signs of infection.  I discussed with her that some patients do benefit from sodium thiosulfate and she will discuss this with her nephrologist and rheumatologist.  Culture of the  tissue was taken I will let her know if there is any signs growth requiring antibiotic therapy.  May require intermittent debridement of the overlying hyperkeratosis  Return if symptoms worsen or fail to improve.

## 2022-12-17 DIAGNOSIS — L930 Discoid lupus erythematosus: Secondary | ICD-10-CM | POA: Diagnosis not present

## 2022-12-17 LAB — WOUND CULTURE: SPECIMEN QUALITY:: ADEQUATE

## 2022-12-18 LAB — WOUND CULTURE

## 2022-12-21 DIAGNOSIS — Z992 Dependence on renal dialysis: Secondary | ICD-10-CM | POA: Diagnosis not present

## 2022-12-21 DIAGNOSIS — N186 End stage renal disease: Secondary | ICD-10-CM | POA: Diagnosis not present

## 2022-12-21 DIAGNOSIS — D509 Iron deficiency anemia, unspecified: Secondary | ICD-10-CM | POA: Diagnosis not present

## 2022-12-21 DIAGNOSIS — N2581 Secondary hyperparathyroidism of renal origin: Secondary | ICD-10-CM | POA: Diagnosis not present

## 2022-12-21 DIAGNOSIS — D631 Anemia in chronic kidney disease: Secondary | ICD-10-CM | POA: Diagnosis not present

## 2022-12-23 ENCOUNTER — Emergency Department (HOSPITAL_BASED_OUTPATIENT_CLINIC_OR_DEPARTMENT_OTHER): Payer: 59

## 2022-12-23 ENCOUNTER — Encounter (HOSPITAL_BASED_OUTPATIENT_CLINIC_OR_DEPARTMENT_OTHER): Payer: Self-pay

## 2022-12-23 ENCOUNTER — Other Ambulatory Visit: Payer: Self-pay

## 2022-12-23 ENCOUNTER — Emergency Department (HOSPITAL_BASED_OUTPATIENT_CLINIC_OR_DEPARTMENT_OTHER)
Admission: EM | Admit: 2022-12-23 | Discharge: 2022-12-23 | Disposition: A | Discharge status: Home / Self Care | Payer: 59 | Attending: Emergency Medicine | Admitting: Emergency Medicine

## 2022-12-23 DIAGNOSIS — Z79899 Other long term (current) drug therapy: Secondary | ICD-10-CM | POA: Insufficient documentation

## 2022-12-23 DIAGNOSIS — R2232 Localized swelling, mass and lump, left upper limb: Secondary | ICD-10-CM | POA: Diagnosis not present

## 2022-12-23 DIAGNOSIS — R222 Localized swelling, mass and lump, trunk: Secondary | ICD-10-CM | POA: Diagnosis not present

## 2022-12-23 DIAGNOSIS — R109 Unspecified abdominal pain: Secondary | ICD-10-CM | POA: Diagnosis not present

## 2022-12-23 DIAGNOSIS — Z992 Dependence on renal dialysis: Secondary | ICD-10-CM | POA: Diagnosis not present

## 2022-12-23 DIAGNOSIS — I120 Hypertensive chronic kidney disease with stage 5 chronic kidney disease or end stage renal disease: Secondary | ICD-10-CM | POA: Insufficient documentation

## 2022-12-23 DIAGNOSIS — N186 End stage renal disease: Secondary | ICD-10-CM | POA: Diagnosis not present

## 2022-12-23 DIAGNOSIS — R229 Localized swelling, mass and lump, unspecified: Secondary | ICD-10-CM

## 2022-12-23 DIAGNOSIS — D631 Anemia in chronic kidney disease: Secondary | ICD-10-CM | POA: Diagnosis not present

## 2022-12-23 DIAGNOSIS — D509 Iron deficiency anemia, unspecified: Secondary | ICD-10-CM | POA: Diagnosis not present

## 2022-12-23 DIAGNOSIS — N2581 Secondary hyperparathyroidism of renal origin: Secondary | ICD-10-CM | POA: Diagnosis not present

## 2022-12-23 LAB — URINALYSIS, W/ REFLEX TO CULTURE (INFECTION SUSPECTED)
Bilirubin Urine: NEGATIVE
Glucose, UA: NEGATIVE mg/dL
Ketones, ur: NEGATIVE mg/dL
Nitrite: NEGATIVE
Protein, ur: >300 mg/dL — AB
Specific Gravity, Urine: 1.008 (ref 1.005–1.030)
pH: 8 (ref 5.0–8.0)

## 2022-12-23 LAB — COMPREHENSIVE METABOLIC PANEL
ALT: 7 U/L (ref 0–44)
AST: 15 U/L (ref 15–41)
Albumin: 4.4 g/dL (ref 3.5–5.0)
Alkaline Phosphatase: 64 U/L (ref 38–126)
Anion gap: 12 (ref 5–15)
BUN: 22 mg/dL — ABNORMAL HIGH (ref 6–20)
CO2: 31 mmol/L (ref 22–32)
Calcium: 10.1 mg/dL (ref 8.9–10.3)
Chloride: 94 mmol/L — ABNORMAL LOW (ref 98–111)
Creatinine, Ser: 4.94 mg/dL — ABNORMAL HIGH (ref 0.44–1.00)
GFR, Estimated: 12 mL/min — ABNORMAL LOW (ref >60)
Glucose, Bld: 96 mg/dL (ref 70–99)
Potassium: 3.9 mmol/L (ref 3.5–5.1)
Sodium: 137 mmol/L (ref 135–145)
Total Bilirubin: 0.7 mg/dL (ref 0.3–1.2)
Total Protein: 8.7 g/dL — ABNORMAL HIGH (ref 6.5–8.1)

## 2022-12-23 LAB — CBC
HCT: 38.9 % (ref 36.0–46.0)
Hemoglobin: 12.7 g/dL (ref 12.0–15.0)
MCH: 32 pg (ref 26.0–34.0)
MCHC: 32.6 g/dL (ref 30.0–36.0)
MCV: 98 fL (ref 80.0–100.0)
Platelets: 210 10*3/uL (ref 150–400)
RBC: 3.97 MIL/uL (ref 3.87–5.11)
RDW: 15 % (ref 11.5–15.5)
WBC: 12.6 10*3/uL — ABNORMAL HIGH (ref 4.0–10.5)
nRBC: 0 % (ref 0.0–0.2)

## 2022-12-23 LAB — OCCULT BLOOD X 1 CARD TO LAB, STOOL: Fecal Occult Bld: NEGATIVE

## 2022-12-23 LAB — HCG, SERUM, QUALITATIVE: Preg, Serum: NEGATIVE

## 2022-12-23 MED ORDER — MORPHINE SULFATE (PF) 4 MG/ML IV SOLN
4.0000 mg | Freq: Once | INTRAVENOUS | Status: AC
  Administered 2022-12-23: 4 mg via INTRAVENOUS
  Filled 2022-12-23: qty 1

## 2022-12-23 NOTE — ED Provider Notes (Signed)
[facility] Provider Note   CSN: [id number] Arrival date & time: 12/23/22  1525     History  Chief Complaint  Patient presents with   Flank Pain    Linda [name] is a 27 y.o. female.  With a history of ESRD secondary to lupus on dialysis Monday, Wednesday and Friday, hypertension who presents to the ED for evaluation of right flank pain.  States this suddenly began 4 days ago.  It is worse with any type of movement.  Denies history of similar.  Denies history of kidney stones.  She reports compliance with her dialysis.  She denies nausea or vomiting.  She states her urine has been slightly darker than normal lately, but denies frank hematuria.  She denies dysuria, frequency, urgency.  She denies anterior abdominal pain.  She started the Depo shot for birth control 3 months ago and states that she had her first menstrual cycle beginning 3 days ago.  States this vaginal bleeding has since stopped.  She noticed 1 episode of possible hematochezia during that time as well.  She has not had any since.  No melena.  She is also complaining of pain to her subcutaneous nodules.  She has 2 nodules to the dorsum of the left forearm and 2 nodules to the posterior of the right upper thigh.  These nodules have not drained anything recently.  They are typically not painful, however became painful a few days ago.  She denies chest pain or shortness of breath.   Flank Pain       Home Medications Prior to Admission medications   Medication Sig Start Date End Date Taking? Authorizing Provider  amLODipine (NORVASC) 10 MG tablet Take 10 mg by mouth daily. 08/05/21   [provider]  buprenorphine (BUTRANS) 5 MCG/HR PTWK 1 PATCH ON SKIN WEEKLY    [provider]  calcitRIOL (ROCALTROL) 0.25 MCG capsule Take 1 tablet by mouth daily.    [provider]  carvedilol (COREG) 12.5 MG tablet Take 1 tablet (12.5 mg total) by mouth 2 (two) times  daily with a meal. 10/02/21 05/18/22  Lynn Ito, MD  Cinacalcet HCl (SENSIPAR PO) Take by mouth. 09/25/22 09/24/23  [provider]  furosemide (LASIX) 40 MG tablet Take 3 tablets (120 mg total) by mouth 2 (two) times daily. 10/02/21 05/18/22  Lynn Ito, MD  gabapentin (NEURONTIN) 100 MG capsule Take 1 capsule by mouth 2 (two) times daily.    [provider]  hydrALAZINE (APRESOLINE) 100 MG tablet Take 1 tablet (100 mg total) by mouth 3 (three) times daily. 10/02/21 11/01/21  Lynn Ito, MD  lidocaine (XYLOCAINE) 5 % ointment Apply topically. 11/25/20   [provider]  lidocaine-prilocaine (EMLA) cream Apply topically. 11/17/21   [provider]  losartan (COZAAR) 50 MG tablet Take 1 tablet (50 mg total) by mouth daily. 10/02/21 05/18/22  Lynn Ito, MD  medroxyPROGESTERone (DEPO-PROVERA) 150 MG/ML injection Inject 1 mL (150 mg total) into the muscle every 3 (three) months. 10/29/22   Brock Bad, MD  Methoxy PEG-Epoetin Beta (MIRCERA IJ) Mircera 09/28/22 09/27/23  [provider]  naloxone (NARCAN) nasal spray 4 mg/0.1 mL SPRAY AS NEEDED    [provider]  oxyCODONE (ROXICODONE) 15 MG immediate release tablet Take 15 mg by mouth 5 (five) times daily as needed.    [provider]  Oxycodone HCl 10 MG TABS TAKE 1 TABLET BY MOUTH FIVE TIMES DAILY AS NEEDED  [provider]  oxyCODONE-acetaminophen (PERCOCET) 10-325 MG tablet TAKE 1 TABLET BY MOUTH FIVE TIMES DAILY AS NEEDED    [provider]  oxyCODONE-acetaminophen (PERCOCET/ROXICET) 5-325 MG tablet TAKE 1 TABLET FIVE TIMES DAILY AS NEEDED    [provider]  pantoprazole (PROTONIX) 40 MG tablet Take 1 tablet (40 mg total) by mouth 2 (two) times daily. Patient not taking: Reported on 09/02/2021 01/28/18 09/02/21  Briant Cedar, MD  RENVELA 2.4 g PACK Take by mouth. 12/03/22   [provider]  rifampin (RIFADIN) 300 MG capsule Take 300 mg by mouth 2  (two) times daily. 08/22/21   [provider]  sodium bicarbonate 650 MG tablet Take 1 tablet (650 mg total) by mouth 2 (two) times daily. 09/05/21   Leroy Sea, MD  SYMBICORT 160-4.5 MCG/ACT inhaler Inhale 1 puff into the lungs daily as needed (For shortness of breath). 07/29/21   [provider]  traZODone (DESYREL) 50 MG tablet Take 1 tablet by mouth at bedtime.    [provider]  triamcinolone cream (KENALOG) 0.1 % PLEASE SEE ATTACHED FOR DETAILED DIRECTIONS    [provider]  Vitamin D, Ergocalciferol, (DRISDOL) 1.25 MG (50000 UNIT) CAPS capsule ergocalciferol (vitamin D2) 1,250 mcg (50,000 unit) capsule TAKE 1 CAPSULE BY MOUTH WEEKLY 03/11/21   [provider]      Allergies    Lisinopril, Gabapentin, Ativan [lorazepam], Hydroxychloroquine, and Vicodin [hydrocodone-acetaminophen]    Review of Systems   Review of Systems  Gastrointestinal:  Positive for blood in stool.  Genitourinary:  Positive for flank pain.  All other systems reviewed and are negative.   Physical Exam Updated Vital Signs BP (!) 154/107 (BP Location: Right Arm)   Pulse 80   Temp 98.5 F (36.9 C) (Oral)   Resp 16   Ht 5\' 6"  (1.676 m)   Wt 77.1 kg   SpO2 100%   BMI 27.44 kg/m  Physical Exam Vitals and nursing note reviewed. Exam conducted with a chaperone present (Josh, NT).  Constitutional:      General: She is not in acute distress.    Appearance: She is well-developed.  HENT:     Head: Normocephalic and atraumatic.  Eyes:     Conjunctiva/sclera: Conjunctivae normal.  Cardiovascular:     Rate and Rhythm: Normal rate and regular rhythm.     Heart sounds: No murmur heard. Pulmonary:     Effort: Pulmonary effort is normal. No respiratory distress.     Breath sounds: Normal breath sounds.  Abdominal:     Palpations: Abdomen is soft.     Tenderness: There is no abdominal tenderness. There is right CVA tenderness. There is no left CVA tenderness or  guarding.     Comments: Negative Murphy sign, Rovsing sign, McBurney's sign, psoas sign, obturator sign  Genitourinary:    Comments: Numerous small subcutaneous nodules to bilateral buttocks.  1 slightly larger nodule to the right upper posterior thigh.  None of these nodules or fluctuance.  There is no surrounding erythema or overlying warmth.  No drainage noted from any nodule.  No obvious blood or melena on rectal exam.  No step-offs, masses or deformities Musculoskeletal:        General: No swelling.     Cervical back: Neck supple.     Right lower leg: No edema.     Left lower leg: No edema.  Skin:    General: Skin is warm and dry.     Capillary Refill: Capillary refill  takes less than 2 seconds.     Comments: Two total 2 cm x 2 cm subcutaneous nodules to the posterior of the left forearm.  No fluctuance, erythema, warmth or drainage  Neurological:     General: No focal deficit present.     Mental Status: She is alert and oriented to person, place, and time.  Psychiatric:        Mood and Affect: Mood normal.     ED Results / Procedures / Treatments   Labs (all labs ordered are listed, but only abnormal results are displayed) Labs Reviewed  COMPREHENSIVE METABOLIC PANEL - Abnormal; Notable for the following components:      Result Value   Chloride 94 (*)    BUN 22 (*)    Creatinine, Ser 4.94 (*)    Total Protein 8.7 (*)    GFR, Estimated 12 (*)    All other components within normal limits  CBC - Abnormal; Notable for the following components:   WBC 12.6 (*)    All other components within normal limits  URINALYSIS, W/ REFLEX TO CULTURE (INFECTION SUSPECTED) - Abnormal; Notable for the following components:   APPearance HAZY (*)    Hgb urine dipstick TRACE (*)    Protein, ur >300 (*)    Leukocytes,Ua SMALL (*)    Bacteria, UA RARE (*)    All other components within normal limits  HCG, SERUM, QUALITATIVE  OCCULT BLOOD X 1 CARD TO LAB, STOOL    EKG None  Radiology CT  Renal Stone Study  Result Date: 12/23/2022 CLINICAL DATA:  Abdominal and flank pain.  Renal stone suspected. EXAM: CT ABDOMEN AND PELVIS WITHOUT CONTRAST TECHNIQUE: Multidetector CT imaging of the abdomen and pelvis was performed following the standard protocol without IV contrast. RADIATION DOSE REDUCTION: This exam was performed according to the departmental dose-optimization program which includes automated exposure control, adjustment of the mA and/or kV according to patient size and/or use of iterative reconstruction technique. COMPARISON:  08/12/2022 FINDINGS: Lower chest: Mild cardiac enlargement as seen previously. Mild chronic scarring at the right lung base. No active chest process. Hepatobiliary: Liver parenchyma is normal without contrast. No calcified gallstones. Pancreas: Normal Spleen: Normal Adrenals/Urinary Tract: No adrenal abnormality seen on today's study. Both kidneys appear normal without evidence of stone, mass, hydronephrosis or inflammatory change. The bladder is collapsed but unremarkable. Stomach/Bowel: The stomach and small intestine are normal. No sign of an inflamed appendix. No abnormal colon finding. Vascular/Lymphatic: The aorta shows minimal premature atherosclerotic calcification. The IVC is normal. No adenopathy. Reproductive: No pelvic mass. Other: No free fluid or air. Musculoskeletal: Chronic bilateral pars defects at L5 with 2-3 mm of spondylolisthesis. IMPRESSION: 1. No acute finding by CT. No evidence of urinary tract stone disease or other urinary tract pathology. 2. Chronic bilateral pars defects at L5 with 2-3 mm of spondylolisthesis. 3. Mild cardiac enlargement. 4. Minimal premature atherosclerotic calcification of the aorta. Aortic Atherosclerosis (ICD10-I70.0). Electronically Signed   By: Paulina Fusi M.D.   On: 12/23/2022 17:54    Procedures Procedures    Medications Ordered in ED Medications  morphine (PF) 4 MG/ML injection 4 mg (4 mg Intravenous Given  12/23/22 1700)    ED Course/ Medical Decision Making/ A&P                             Medical Decision Making Amount and/or Complexity of Data Reviewed Labs: ordered. Radiology: ordered.  Risk Prescription drug  management.  This patient presents to the ED for concern of right flank pain, multiple skin nodules, this involves an extensive number of treatment options, and is a complaint that carries with it a high risk of complications and morbidity.  The differential diagnosis of emergent flank pain includes, but is not limited to :Abdominal aortic aneurysm,, Renal artery embolism,Renal vein thrombosis, Aortic dissection, Mesenteric ischemia, Pyelonephritis, Renal infarction, Renal hemorrhage, Nephrolithiasis/ Renal Colic, Bladder tumor,Cystitis, Biliary colic, Pancreatitis Perforated peptic ulcer Appendicitis ,Inguinal Hernia, Diverticulitis, Bowel obstruction Ectopic Pregnancy,PID/TOA,Ovarian cyst, Ovarian torsionShingles Lower lobe pneumonia, Retroperitoneal hematoma/abscess/tumor, Epidural abscess, Epidural hematoma   Co morbidities that complicate the patient evaluation   ESRD secondary to lupus on dialysis Monday, Wednesday and Friday, hypertension  My initial workup includes labs, imaging, pain control  Additional history obtained from: Nursing notes from this visit.  I ordered, reviewed and interpreted labs which include: CBC, CMP, hCG, urinalysis, Hemoccult.  CMP shows improvement over baseline with no severe electrolyte derangements.  Urinalysis is contaminated but shows small leukocytes and rare bacteria.  Unlikely to be a UTI given lack of symptoms.  CBC with borderline leukocytosis of 12.6  I ordered imaging studies including CT stone study I independently visualized and interpreted imaging which showed no acute abnormalities I agree with the radiologist interpretation  Afebrile, hypertensive but otherwise hemodynamically stable.  27 year old female presents ED for  evaluation of right flank pain.  This is predictably reproducible with twisting of the spine and spinal flexion.  There is some tenderness to the area as well.  Lab workup overall reassuring.  CT stone study negative for acute abnormalities.  Favor musculoskeletal cause of her symptoms.  She reported significant improvement in her symptoms after pain control in the ED.  She has oxycodone at home and states she will take this for continued management.  Also complaining of numerous skin nodules.  This appears to be a chronic issue.  They have recently started causing her some pain.  None of these nodules appear to be infected.  There is no overlying erythema or warmth.  No drainage.  She does not know why she has these nodules.  She was encouraged to follow-up with her primary care provider regarding these.  Overall low suspicion for acute emergent abnormalities given her reassuring workup and reproducible pain.  She was also encouraged to follow-up with her rheumatologist regarding control of her lupus.  She was given return precautions.  Stable at discharge.  At this time there does not appear to be any evidence of an acute emergency medical condition and the patient appears stable for discharge with appropriate outpatient follow up. Diagnosis was discussed with patient who verbalizes understanding of care plan and is agreeable to discharge. I have discussed return precautions with patient who verbalizes understanding. Patient encouraged to follow-up with their PCP within 1 week. All questions answered.  Patient's case discussed with Dr. Fredderick Phenix who agrees with plan to discharge with follow-up.   Note: Portions of this report may have been transcribed using voice recognition software. Every effort was made to ensure accuracy; however, inadvertent computerized transcription errors may still be present.        Final Clinical Impression(s) / ED Diagnoses Final diagnoses:  Flank pain  Multiple skin  nodules    Rx / DC Orders ED Discharge Orders     None         Michelle Piper, Cordelia Poche 12/23/22 1940    Rolan Bucco, MD 12/23/22 2221

## 2022-12-23 NOTE — ED Notes (Signed)
Report given to the next RN... 

## 2022-12-23 NOTE — Discharge Instructions (Signed)
You have been seen today for your complaint of right-sided flank pain. Your lab work was overall reassuring. Your imaging was reassuring and showed no new abnormalities. Your discharge medications include your home medications for pain.  Take them as prescribed. Follow up with: Your primary care provider regarding your skin nodules.  You should also follow-up with your kidney doctor and your lupus doctor. Please seek immediate medical care if you develop any of the following symptoms: You have trouble breathing or you are short of breath. Your abdomen hurts or it is swollen or red. You have nausea or vomiting. You feel faint, or you faint. You have blood in your urine. You have flank pain and a fever. At this time there does not appear to be the presence of an emergent medical condition, however there is always the potential for conditions to change. Please read and follow the below instructions.  Do not take your medicine if  develop an itchy rash, swelling in your mouth or lips, or difficulty breathing; call 911 and seek immediate emergency medical attention if this occurs.  You may review your lab tests and imaging results in their entirety on your MyChart account.  Please discuss all results of fully with your primary care provider and other specialist at your follow-up visit.  Note: Portions of this text may have been transcribed using voice recognition software. Every effort was made to ensure accuracy; however, inadvertent computerized transcription errors may still be present.

## 2022-12-23 NOTE — ED Notes (Signed)
Pt aware of the need for a urine... Unable to currently provide... 

## 2022-12-23 NOTE — ED Triage Notes (Signed)
Patient here POV from Home.  Endorses Right Flank Pain. Present for 4 Days. No Known Trauma. Notes Blood in Urine or Vagina but is unsure. No Confirmed Fever.  ESRD. Dialysis MWF. Went today. Also notes "Boils" to Upper Right Thigh below buttock.   NAD noted during Triage. A&Ox4. Gcs 15. Ambulatory.

## 2022-12-23 NOTE — ED Notes (Signed)
Provider aware the hemocult card in room.Marland KitchenMarland Kitchen

## 2022-12-25 DIAGNOSIS — D631 Anemia in chronic kidney disease: Secondary | ICD-10-CM | POA: Diagnosis not present

## 2022-12-25 DIAGNOSIS — N2581 Secondary hyperparathyroidism of renal origin: Secondary | ICD-10-CM | POA: Diagnosis not present

## 2022-12-25 DIAGNOSIS — N186 End stage renal disease: Secondary | ICD-10-CM | POA: Diagnosis not present

## 2022-12-25 DIAGNOSIS — Z992 Dependence on renal dialysis: Secondary | ICD-10-CM | POA: Diagnosis not present

## 2022-12-25 DIAGNOSIS — D509 Iron deficiency anemia, unspecified: Secondary | ICD-10-CM | POA: Diagnosis not present

## 2022-12-29 DIAGNOSIS — M3210 Systemic lupus erythematosus, organ or system involvement unspecified: Secondary | ICD-10-CM | POA: Diagnosis not present

## 2022-12-29 DIAGNOSIS — N186 End stage renal disease: Secondary | ICD-10-CM | POA: Diagnosis not present

## 2022-12-29 DIAGNOSIS — Z992 Dependence on renal dialysis: Secondary | ICD-10-CM | POA: Diagnosis not present

## 2023-01-04 DIAGNOSIS — N186 End stage renal disease: Secondary | ICD-10-CM | POA: Diagnosis not present

## 2023-01-04 DIAGNOSIS — Z992 Dependence on renal dialysis: Secondary | ICD-10-CM | POA: Diagnosis not present

## 2023-01-04 DIAGNOSIS — I132 Hypertensive heart and chronic kidney disease with heart failure and with stage 5 chronic kidney disease, or end stage renal disease: Secondary | ICD-10-CM | POA: Diagnosis not present

## 2023-01-04 DIAGNOSIS — N2581 Secondary hyperparathyroidism of renal origin: Secondary | ICD-10-CM | POA: Diagnosis not present

## 2023-01-04 DIAGNOSIS — D631 Anemia in chronic kidney disease: Secondary | ICD-10-CM | POA: Diagnosis not present

## 2023-01-04 DIAGNOSIS — D509 Iron deficiency anemia, unspecified: Secondary | ICD-10-CM | POA: Diagnosis not present

## 2023-01-05 DIAGNOSIS — Z79899 Other long term (current) drug therapy: Secondary | ICD-10-CM | POA: Diagnosis not present

## 2023-01-05 DIAGNOSIS — M25562 Pain in left knee: Secondary | ICD-10-CM | POA: Diagnosis not present

## 2023-01-05 DIAGNOSIS — M25561 Pain in right knee: Secondary | ICD-10-CM | POA: Diagnosis not present

## 2023-01-05 DIAGNOSIS — M545 Low back pain, unspecified: Secondary | ICD-10-CM | POA: Diagnosis not present

## 2023-01-05 DIAGNOSIS — M329 Systemic lupus erythematosus, unspecified: Secondary | ICD-10-CM | POA: Diagnosis not present

## 2023-01-05 DIAGNOSIS — G8929 Other chronic pain: Secondary | ICD-10-CM | POA: Diagnosis not present

## 2023-01-06 DIAGNOSIS — N2581 Secondary hyperparathyroidism of renal origin: Secondary | ICD-10-CM | POA: Diagnosis not present

## 2023-01-06 DIAGNOSIS — D631 Anemia in chronic kidney disease: Secondary | ICD-10-CM | POA: Diagnosis not present

## 2023-01-06 DIAGNOSIS — N186 End stage renal disease: Secondary | ICD-10-CM | POA: Diagnosis not present

## 2023-01-06 DIAGNOSIS — Z992 Dependence on renal dialysis: Secondary | ICD-10-CM | POA: Diagnosis not present

## 2023-01-06 DIAGNOSIS — D509 Iron deficiency anemia, unspecified: Secondary | ICD-10-CM | POA: Diagnosis not present

## 2023-01-06 DIAGNOSIS — I132 Hypertensive heart and chronic kidney disease with heart failure and with stage 5 chronic kidney disease, or end stage renal disease: Secondary | ICD-10-CM | POA: Diagnosis not present

## 2023-01-11 DIAGNOSIS — D509 Iron deficiency anemia, unspecified: Secondary | ICD-10-CM | POA: Diagnosis not present

## 2023-01-11 DIAGNOSIS — D631 Anemia in chronic kidney disease: Secondary | ICD-10-CM | POA: Diagnosis not present

## 2023-01-11 DIAGNOSIS — N186 End stage renal disease: Secondary | ICD-10-CM | POA: Diagnosis not present

## 2023-01-11 DIAGNOSIS — N2581 Secondary hyperparathyroidism of renal origin: Secondary | ICD-10-CM | POA: Diagnosis not present

## 2023-01-11 DIAGNOSIS — Z992 Dependence on renal dialysis: Secondary | ICD-10-CM | POA: Diagnosis not present

## 2023-01-11 DIAGNOSIS — I132 Hypertensive heart and chronic kidney disease with heart failure and with stage 5 chronic kidney disease, or end stage renal disease: Secondary | ICD-10-CM | POA: Diagnosis not present

## 2023-01-13 DIAGNOSIS — N2581 Secondary hyperparathyroidism of renal origin: Secondary | ICD-10-CM | POA: Diagnosis not present

## 2023-01-13 DIAGNOSIS — D509 Iron deficiency anemia, unspecified: Secondary | ICD-10-CM | POA: Diagnosis not present

## 2023-01-13 DIAGNOSIS — N186 End stage renal disease: Secondary | ICD-10-CM | POA: Diagnosis not present

## 2023-01-13 DIAGNOSIS — Z992 Dependence on renal dialysis: Secondary | ICD-10-CM | POA: Diagnosis not present

## 2023-01-13 DIAGNOSIS — D631 Anemia in chronic kidney disease: Secondary | ICD-10-CM | POA: Diagnosis not present

## 2023-01-13 DIAGNOSIS — I132 Hypertensive heart and chronic kidney disease with heart failure and with stage 5 chronic kidney disease, or end stage renal disease: Secondary | ICD-10-CM | POA: Diagnosis not present

## 2023-01-14 DIAGNOSIS — L905 Scar conditions and fibrosis of skin: Secondary | ICD-10-CM | POA: Diagnosis not present

## 2023-01-14 DIAGNOSIS — L930 Discoid lupus erythematosus: Secondary | ICD-10-CM | POA: Diagnosis not present

## 2023-01-15 DIAGNOSIS — Z992 Dependence on renal dialysis: Secondary | ICD-10-CM | POA: Diagnosis not present

## 2023-01-15 DIAGNOSIS — I132 Hypertensive heart and chronic kidney disease with heart failure and with stage 5 chronic kidney disease, or end stage renal disease: Secondary | ICD-10-CM | POA: Diagnosis not present

## 2023-01-15 DIAGNOSIS — D631 Anemia in chronic kidney disease: Secondary | ICD-10-CM | POA: Diagnosis not present

## 2023-01-15 DIAGNOSIS — N186 End stage renal disease: Secondary | ICD-10-CM | POA: Diagnosis not present

## 2023-01-15 DIAGNOSIS — D509 Iron deficiency anemia, unspecified: Secondary | ICD-10-CM | POA: Diagnosis not present

## 2023-01-15 DIAGNOSIS — N2581 Secondary hyperparathyroidism of renal origin: Secondary | ICD-10-CM | POA: Diagnosis not present

## 2023-01-20 DIAGNOSIS — N186 End stage renal disease: Secondary | ICD-10-CM | POA: Diagnosis not present

## 2023-01-20 DIAGNOSIS — N2581 Secondary hyperparathyroidism of renal origin: Secondary | ICD-10-CM | POA: Diagnosis not present

## 2023-01-20 DIAGNOSIS — Z992 Dependence on renal dialysis: Secondary | ICD-10-CM | POA: Diagnosis not present

## 2023-01-20 DIAGNOSIS — I132 Hypertensive heart and chronic kidney disease with heart failure and with stage 5 chronic kidney disease, or end stage renal disease: Secondary | ICD-10-CM | POA: Diagnosis not present

## 2023-01-20 DIAGNOSIS — D631 Anemia in chronic kidney disease: Secondary | ICD-10-CM | POA: Diagnosis not present

## 2023-01-20 DIAGNOSIS — D509 Iron deficiency anemia, unspecified: Secondary | ICD-10-CM | POA: Diagnosis not present

## 2023-01-22 DIAGNOSIS — N2581 Secondary hyperparathyroidism of renal origin: Secondary | ICD-10-CM | POA: Diagnosis not present

## 2023-01-22 DIAGNOSIS — D631 Anemia in chronic kidney disease: Secondary | ICD-10-CM | POA: Diagnosis not present

## 2023-01-22 DIAGNOSIS — D509 Iron deficiency anemia, unspecified: Secondary | ICD-10-CM | POA: Diagnosis not present

## 2023-01-22 DIAGNOSIS — Z992 Dependence on renal dialysis: Secondary | ICD-10-CM | POA: Diagnosis not present

## 2023-01-22 DIAGNOSIS — N186 End stage renal disease: Secondary | ICD-10-CM | POA: Diagnosis not present

## 2023-01-22 DIAGNOSIS — I132 Hypertensive heart and chronic kidney disease with heart failure and with stage 5 chronic kidney disease, or end stage renal disease: Secondary | ICD-10-CM | POA: Diagnosis not present

## 2023-01-24 ENCOUNTER — Other Ambulatory Visit: Payer: Self-pay | Admitting: Obstetrics

## 2023-01-26 ENCOUNTER — Encounter (HOSPITAL_BASED_OUTPATIENT_CLINIC_OR_DEPARTMENT_OTHER): Payer: 59 | Attending: Internal Medicine | Admitting: Internal Medicine

## 2023-01-26 DIAGNOSIS — L308 Other specified dermatitis: Secondary | ICD-10-CM

## 2023-01-26 DIAGNOSIS — N186 End stage renal disease: Secondary | ICD-10-CM

## 2023-01-26 DIAGNOSIS — L930 Discoid lupus erythematosus: Secondary | ICD-10-CM

## 2023-01-26 DIAGNOSIS — M069 Rheumatoid arthritis, unspecified: Secondary | ICD-10-CM | POA: Diagnosis not present

## 2023-01-26 DIAGNOSIS — F17200 Nicotine dependence, unspecified, uncomplicated: Secondary | ICD-10-CM | POA: Insufficient documentation

## 2023-01-26 DIAGNOSIS — Z992 Dependence on renal dialysis: Secondary | ICD-10-CM | POA: Diagnosis not present

## 2023-01-26 DIAGNOSIS — I509 Heart failure, unspecified: Secondary | ICD-10-CM | POA: Diagnosis not present

## 2023-01-26 DIAGNOSIS — I132 Hypertensive heart and chronic kidney disease with heart failure and with stage 5 chronic kidney disease, or end stage renal disease: Secondary | ICD-10-CM | POA: Insufficient documentation

## 2023-01-29 DIAGNOSIS — Z992 Dependence on renal dialysis: Secondary | ICD-10-CM | POA: Diagnosis not present

## 2023-01-29 DIAGNOSIS — M3210 Systemic lupus erythematosus, organ or system involvement unspecified: Secondary | ICD-10-CM | POA: Diagnosis not present

## 2023-01-29 DIAGNOSIS — N2581 Secondary hyperparathyroidism of renal origin: Secondary | ICD-10-CM | POA: Diagnosis not present

## 2023-01-29 DIAGNOSIS — I132 Hypertensive heart and chronic kidney disease with heart failure and with stage 5 chronic kidney disease, or end stage renal disease: Secondary | ICD-10-CM | POA: Diagnosis not present

## 2023-01-29 DIAGNOSIS — D509 Iron deficiency anemia, unspecified: Secondary | ICD-10-CM | POA: Diagnosis not present

## 2023-01-29 DIAGNOSIS — N186 End stage renal disease: Secondary | ICD-10-CM | POA: Diagnosis not present

## 2023-01-29 DIAGNOSIS — D631 Anemia in chronic kidney disease: Secondary | ICD-10-CM | POA: Diagnosis not present

## 2023-01-29 NOTE — Progress Notes (Signed)
Linda [name] ([id number]) [id number] Page 1 of 4 Visit Report for 01/26/2023 Abuse Risk Screen Details Patient Name: Date of Service: Linda [name] Linda [name]. 01/26/2023 9:30 A M Medical Record Number: [id number] Patient Account Number: [id number] Date of Birth/Sex: Treating RN: Dec 28, 1995 (27 y.o. F) Primary Care Lynzi Meulemans: PCP, NO Other Clinician: Referring Samuell Knoble: Treating Tynleigh Birt/Extender: Doreene Eland in Treatment: 0 Abuse Risk Screen Items Answer ABUSE RISK SCREEN: Has anyone close to you tried to hurt or harm you recentlyo No Do you feel uncomfortable with anyone in your familyo No Has anyone forced you do things that you didnt want to doo No Electronic Signature(s) Signed: 01/28/2023 4:41:57 PM By: Thayer Dallas Entered By: Thayer Dallas on 01/26/2023 09:52:39 -------------------------------------------------------------------------------- Activities of Daily Living Details Patient Name: Date of Service: Linda [name] Linda [name]. 01/26/2023 9:30 A M Medical Record Number: [id number] Patient Account Number: [id number] Date of Birth/Sex: Treating RN: [birth year] (27 y.o. F) Primary Care Askia Hazelip: PCP, NO Other Clinician: Referring Amber Williard: Treating Carolyn Maniscalco/Extender: Doreene Eland in Treatment: 0 Activities of Daily Living Items Answer Activities of Daily Living (Please select one for each item) Drive Automobile Completely Able T Medications ake Completely Able Use T elephone Completely Able Care for Appearance Completely Able Use T oilet Completely Able Bath / Shower Completely Able Dress Self Completely Able Feed Self Completely Able Walk Completely Able Get In / Out Bed Completely Able Housework Completely Able Prepare Meals Completely Able Handle Money Completely Able Shop for Self Completely Able Electronic Signature(s) Signed: 01/28/2023 4:41:57 PM By: Thayer Dallas Entered By: Thayer Dallas on 01/26/2023 09:53:03 -------------------------------------------------------------------------------- Education Screening Details Patient Name: Date of Service: Linda [name] HJA Linda [name]. 01/26/2023 9:30 A M Medical Record Number: [id number] Patient Account Number: [id number] Date of Birth/Sex: Treating RN: 15-May-1996 (27 y.o. F) Primary Care Sarinity Dicicco: PCP, NO Other Clinician: Referring Valdez Brannan: Treating Aliza Moret/Extender: Doreene Eland in TreatmentDannya, Vargason Kallie Locks ([id number]) [id number] Page 2 of 4 Primary Learner Assessed: Patient Learning Preferences/Education Level/Primary Language Learning Preference: Explanation, Demonstration, Printed Material Highest Education Level: College or Above Preferred Language: Economist Language Barrier: No Translator Needed: No Memory Deficit: No Emotional Barrier: No Cultural/Religious Beliefs Affecting Medical Care: No Physical Barrier Impaired Vision: Yes Glasses Impaired Hearing: No Decreased Hand dexterity: No Knowledge/Comprehension Knowledge Level: High Comprehension Level: High Ability to understand written instructions: High Ability to understand verbal instructions: High Motivation Anxiety Level: Calm Cooperation: Cooperative Education Importance: Acknowledges Need Interest in Health Problems: Asks Questions Perception: Coherent Willingness to Engage in Self-Management High Activities: Readiness to Engage in Self-Management High Activities: Electronic Signature(s) Signed: 01/28/2023 4:41:57 PM By: Thayer Dallas Entered By: Thayer Dallas on 01/26/2023 09:53:42 -------------------------------------------------------------------------------- Fall Risk Assessment Details Patient Name: Date of Service: Linda [name], TA HJA Linda [name]. 01/26/2023 9:30 A M Medical Record Number: [id number] Patient Account Number:  [id number] Date of Birth/Sex: Treating RN: 25-Feb-1996 (27 y.o. Orville Govern Primary Care Garwood Wentzell: PCP, NO Other Clinician: Referring Manning Luna: Treating Koray Soter/Extender: Doreene Eland in Treatment: 0 Fall Risk Assessment Items Have you had 2 or more falls in the last 12 monthso 0 No Have you had any fall that resulted in injury in the last 12 monthso 0 No FALLS RISK SCREEN History of falling - immediate or within 3 months 0 No Secondary diagnosis (Do you have 2 or more medical diagnoseso) 0 No Ambulatory aid None/bed rest/wheelchair/nurse 0 Yes Crutches/cane/walker 0 No Furniture 0  No Intravenous therapy Access/Saline/Heparin Lock 0 No Gait/Transferring Normal/ bed rest/ wheelchair 0 Yes Weak (short steps with or without shuffle, stooped but able to lift head while walking, may seek 0 No support from furniture) Impaired (short steps with shuffle, may have difficulty arising from chair, head down, impaired 0 No balance) Mental Status Oriented to own ability 0 Yes Overestimates or forgets limitations 0 No Risk Level: Low Risk Score: 0 Linda [name] ([id number]) [id number] Page 3 of 4 Electronic Signature(s) -------------------------------------------------------------------------------- Foot Assessment Details Patient Name: Date of Service: Linda [name] Linda [name]. 01/26/2023 9:30 A M Medical Record Number: [id number] Patient Account Number: [id number] Date of Birth/Sex: Treating RN: 12/21/1995 (27 y.o. Orville Govern Primary Care Takeela Peil: PCP, NO Other Clinician: Referring Katanya Schlie: Treating Jaxsin Bottomley/Extender: Doreene Eland in Treatment: 0 Foot Assessment Items Site Locations + = Sensation present, - = Sensation absent, C = Callus, U = Ulcer R = Redness, W = Warmth, M = Maceration, PU = Pre-ulcerative lesion F = Fissure, S = Swelling, D = Dryness Assessment Right:  Left: Other Deformity: No No Prior Foot Ulcer: No No Prior Amputation: No No Charcot Joint: No No Ambulatory Status: Ambulatory Without Help Gait: Steady Electronic Signature(s) Signed: 01/26/2023 4:39:11 PM By: Redmond Pulling RN, BSN Entered By: Redmond Pulling on 01/26/2023 09:48:47 -------------------------------------------------------------------------------- Nutrition Risk Screening Details Patient Name: Date of Service: Linda [name] Linda [name]. 01/26/2023 9:30 A M Medical Record Number: [id number] Patient Account Number: [id number] Date of Birth/Sex: Treating RN: Jun 30, 1996 (27 y.o. Orville Govern Primary Care Ingeborg Fite: PCP, NO Other Clinician: Referring Joven Mom: Treating Dallis Czaja/Extender: Doreene Eland in Treatment: 0 Height (in): 66 Weight (lbs): 170 Body Mass Index (BMI): 27.4 Hudler, Avila [name] ([id number]) [id number] Page 4 of 4 Nutrition Risk Screening Items Score Screening NUTRITION RISK SCREEN: I have an illness or condition that made me change the kind and/or amount of food I eat 0 No I eat fewer than two meals per day 0 No I eat few fruits and vegetables, or milk products 0 No I have three or more drinks of beer, liquor or wine almost every day 0 No I have tooth or mouth problems that make it hard for me to eat 0 No I don't always have enough money to buy the food I need 0 No I eat alone most of the time 1 Yes I take three or more different prescribed or over-the-counter drugs a day 0 No Without wanting to, I have lost or gained 10 pounds in the last six months 0 No I am not always physically able to shop, cook and/or feed myself 0 No Nutrition Protocols Good Risk Protocol Moderate Risk Protocol High Risk Proctocol Risk Level: Good Risk Score: 1 Electronic Signature(s) Signed: 01/26/2023 4:39:11 PM By: Redmond Pulling RN, BSN Entered By: Redmond Pulling on 01/26/2023 09:49:06

## 2023-02-05 DIAGNOSIS — N186 End stage renal disease: Secondary | ICD-10-CM | POA: Diagnosis not present

## 2023-02-05 DIAGNOSIS — M329 Systemic lupus erythematosus, unspecified: Secondary | ICD-10-CM | POA: Diagnosis not present

## 2023-02-05 DIAGNOSIS — I10 Essential (primary) hypertension: Secondary | ICD-10-CM | POA: Diagnosis not present

## 2023-02-05 DIAGNOSIS — E559 Vitamin D deficiency, unspecified: Secondary | ICD-10-CM | POA: Diagnosis not present

## 2023-02-05 DIAGNOSIS — D631 Anemia in chronic kidney disease: Secondary | ICD-10-CM | POA: Diagnosis not present

## 2023-02-05 DIAGNOSIS — Z992 Dependence on renal dialysis: Secondary | ICD-10-CM | POA: Diagnosis not present

## 2023-02-05 DIAGNOSIS — E7800 Pure hypercholesterolemia, unspecified: Secondary | ICD-10-CM | POA: Diagnosis not present

## 2023-02-05 DIAGNOSIS — G629 Polyneuropathy, unspecified: Secondary | ICD-10-CM | POA: Diagnosis not present

## 2023-02-05 DIAGNOSIS — G894 Chronic pain syndrome: Secondary | ICD-10-CM | POA: Diagnosis not present

## 2023-02-05 DIAGNOSIS — G4700 Insomnia, unspecified: Secondary | ICD-10-CM | POA: Diagnosis not present

## 2023-02-05 DIAGNOSIS — N2581 Secondary hyperparathyroidism of renal origin: Secondary | ICD-10-CM | POA: Diagnosis not present

## 2023-02-05 DIAGNOSIS — R52 Pain, unspecified: Secondary | ICD-10-CM | POA: Diagnosis not present

## 2023-02-05 DIAGNOSIS — Z79899 Other long term (current) drug therapy: Secondary | ICD-10-CM | POA: Diagnosis not present

## 2023-02-08 DIAGNOSIS — Z992 Dependence on renal dialysis: Secondary | ICD-10-CM | POA: Diagnosis not present

## 2023-02-08 DIAGNOSIS — R52 Pain, unspecified: Secondary | ICD-10-CM | POA: Diagnosis not present

## 2023-02-08 DIAGNOSIS — D631 Anemia in chronic kidney disease: Secondary | ICD-10-CM | POA: Diagnosis not present

## 2023-02-08 DIAGNOSIS — N186 End stage renal disease: Secondary | ICD-10-CM | POA: Diagnosis not present

## 2023-02-08 DIAGNOSIS — N2581 Secondary hyperparathyroidism of renal origin: Secondary | ICD-10-CM | POA: Diagnosis not present

## 2023-02-10 DIAGNOSIS — D631 Anemia in chronic kidney disease: Secondary | ICD-10-CM | POA: Diagnosis not present

## 2023-02-10 DIAGNOSIS — Z992 Dependence on renal dialysis: Secondary | ICD-10-CM | POA: Diagnosis not present

## 2023-02-10 DIAGNOSIS — N2581 Secondary hyperparathyroidism of renal origin: Secondary | ICD-10-CM | POA: Diagnosis not present

## 2023-02-10 DIAGNOSIS — N186 End stage renal disease: Secondary | ICD-10-CM | POA: Diagnosis not present

## 2023-02-10 DIAGNOSIS — R52 Pain, unspecified: Secondary | ICD-10-CM | POA: Diagnosis not present

## 2023-02-15 DIAGNOSIS — N2581 Secondary hyperparathyroidism of renal origin: Secondary | ICD-10-CM | POA: Diagnosis not present

## 2023-02-15 DIAGNOSIS — D631 Anemia in chronic kidney disease: Secondary | ICD-10-CM | POA: Diagnosis not present

## 2023-02-15 DIAGNOSIS — Z992 Dependence on renal dialysis: Secondary | ICD-10-CM | POA: Diagnosis not present

## 2023-02-15 DIAGNOSIS — R52 Pain, unspecified: Secondary | ICD-10-CM | POA: Diagnosis not present

## 2023-02-15 DIAGNOSIS — N186 End stage renal disease: Secondary | ICD-10-CM | POA: Diagnosis not present

## 2023-02-19 DIAGNOSIS — N186 End stage renal disease: Secondary | ICD-10-CM | POA: Diagnosis not present

## 2023-02-19 DIAGNOSIS — D631 Anemia in chronic kidney disease: Secondary | ICD-10-CM | POA: Diagnosis not present

## 2023-02-19 DIAGNOSIS — Z992 Dependence on renal dialysis: Secondary | ICD-10-CM | POA: Diagnosis not present

## 2023-02-19 DIAGNOSIS — N2581 Secondary hyperparathyroidism of renal origin: Secondary | ICD-10-CM | POA: Diagnosis not present

## 2023-02-19 DIAGNOSIS — R52 Pain, unspecified: Secondary | ICD-10-CM | POA: Diagnosis not present

## 2023-02-24 DIAGNOSIS — N186 End stage renal disease: Secondary | ICD-10-CM | POA: Diagnosis not present

## 2023-02-24 DIAGNOSIS — D631 Anemia in chronic kidney disease: Secondary | ICD-10-CM | POA: Diagnosis not present

## 2023-02-24 DIAGNOSIS — Z992 Dependence on renal dialysis: Secondary | ICD-10-CM | POA: Diagnosis not present

## 2023-02-24 DIAGNOSIS — N2581 Secondary hyperparathyroidism of renal origin: Secondary | ICD-10-CM | POA: Diagnosis not present

## 2023-02-24 DIAGNOSIS — R52 Pain, unspecified: Secondary | ICD-10-CM | POA: Diagnosis not present

## 2023-02-28 DIAGNOSIS — M3210 Systemic lupus erythematosus, organ or system involvement unspecified: Secondary | ICD-10-CM | POA: Diagnosis not present

## 2023-02-28 DIAGNOSIS — N186 End stage renal disease: Secondary | ICD-10-CM | POA: Diagnosis not present

## 2023-02-28 DIAGNOSIS — Z992 Dependence on renal dialysis: Secondary | ICD-10-CM | POA: Diagnosis not present

## 2023-03-01 DIAGNOSIS — Z992 Dependence on renal dialysis: Secondary | ICD-10-CM | POA: Diagnosis not present

## 2023-03-01 DIAGNOSIS — N2581 Secondary hyperparathyroidism of renal origin: Secondary | ICD-10-CM | POA: Diagnosis not present

## 2023-03-01 DIAGNOSIS — R110 Nausea: Secondary | ICD-10-CM | POA: Diagnosis not present

## 2023-03-01 DIAGNOSIS — N186 End stage renal disease: Secondary | ICD-10-CM | POA: Diagnosis not present

## 2023-03-01 DIAGNOSIS — D509 Iron deficiency anemia, unspecified: Secondary | ICD-10-CM | POA: Diagnosis not present

## 2023-03-01 DIAGNOSIS — D631 Anemia in chronic kidney disease: Secondary | ICD-10-CM | POA: Diagnosis not present

## 2023-03-01 NOTE — Progress Notes (Signed)
S[place]N, FLANDERS ([id number]) [id number] Page 1 of 8 Visit Report for 01/26/2023 Chief Complaint Document Details Patient Name: Date of Service: Linda [name] NEE L. 01/26/2023 9:30 A M Medical Record Number: [id number] Patient Account Number: [id number] Date of Birth/Sex: Treating RN: [birth year] (27 y.o. F) Primary Care Provider: PCP, NO Other Clinician: Referring Provider: Treating Provider/Extender: Doreene Eland in Treatment: 0 Information Obtained from: Patient Chief Complaint 01/26/2023; cyst like lesions throughout her arms and legs Electronic Signature(s) Signed: 01/26/2023 10:33:57 AM By: Geralyn Corwin DO Entered By: Geralyn Corwin on 01/26/2023 10:25:04 -------------------------------------------------------------------------------- HPI Details Patient Name: Date of Service: Linda [name] HJA NEE L. 01/26/2023 9:30 A M Medical Record Number: [id number] Patient Account Number: [id number] Date of Birth/Sex: Treating RN: [birth year] (27 y.o. F) Primary Care Provider: PCP, NO Other Clinician: Referring Provider: Treating Provider/Extender: Doreene Eland in Treatment: 0 History of Present Illness HPI Description: 01/26/2023 Linda [name] Is a 27 year old female with a past medical history of discoid lupus erythematosus, end-stage renal disease on hemodialysis and rheumatoid arthritis that presents the clinic for a 6-71-month history of boils throughout her arms and legs. She states that the areas come to ahead and then open and drain. Eventually fell back up. She denies increased warmth, erythema or purulent drainage. She denies systemic signs of infection. She has seen rheumatology for this issue 6/7 months ago and was started on a medication although she does not know the name of it. This was eventually stopped. She has not seen dermatology for this issue. She has no obvious open  wounds. Electronic Signature(s) Signed: 01/26/2023 10:33:57 AM By: Geralyn Corwin DO Entered By: Geralyn Corwin on 01/26/2023 10:25:09 -------------------------------------------------------------------------------- Physical Exam Details Patient Name: Date of Service: Linda [name] NEE L. 01/26/2023 9:30 A M Medical Record Number: [id number] Patient Account Number: [id number] Date of Birth/Sex: Treating RN: [birth year] (27 y.o. F) Primary Care Provider: PCP, NO Other ClinicianROXI, ERNSTER ([id number]) [id number] Page 2 of 8 Referring Provider: Treating Provider/Extender: Smith Mince Weeks in Treatment: 0 Constitutional respirations regular, non-labored and within target range for patient.Marland Kitchen Psychiatric pleasant and cooperative. Notes Throughout the arms and legs there are multiple large bumps that appear cystlike. No increased warmth, erythema or purulent drainage. Electronic Signature(s) Signed: 01/26/2023 10:33:57 AM By: Geralyn Corwin DO Entered By: Geralyn Corwin on 01/26/2023 10:25:20 -------------------------------------------------------------------------------- Physician Orders Details Patient Name: Date of Service: Linda [name] HJA NEE L. 01/26/2023 9:30 A M Medical Record Number: [id number] Patient Account Number: [id number] Date of Birth/Sex: Treating RN: 1996/06/26 (27 y.o. Orville Govern Primary Care Provider: PCP, NO Other Clinician: Referring Provider: Treating Provider/Extender: Doreene Eland in Treatment: 0 Verbal / Phone Orders: No Diagnosis Coding ICD-10 Coding Code Description M32.9 Systemic lupus erythematosus, unspecified N18.6 End stage renal disease L30.8 Other specified dermatitis Discharge From [facility] Discharge from Wound Care Center - Follow up with Dermatology and Rheumatology Consults Dermatology - Refer to [facility] Dermatology for  evaluation and treatment of boils/nodules - (ICD10 M32.9 - Systemic lupus erythematosus, unspecified) Electronic Signature(s) Signed: 01/26/2023 3:42:44 PM By: Geralyn Corwin DO Signed: 01/26/2023 4:39:11 PM By: Redmond Pulling RN, BSN Previous Signature: 01/26/2023 10:33:57 AM Version By: Geralyn Corwin DO Entered By: Redmond Pulling on 01/26/2023 15:41:19 Prescription 01/26/2023 -------------------------------------------------------------------------------- Richens, Lashawnta L. Geralyn Corwin DO Patient Name: Provider: 11/23/1995 [id number] Date of Birth: NPI#: [id number] Sex: DEA #: [phone] Phone #: License #: UPN: Patient Address: [address]  [place] Eligha Bridegroom [facility] EUSEBIA, FREELON ([id number]) [phone].pdf Page 3 of 8 [place], [place] [address] [address] [place], [place] [address] [phone] Allergies lisinopril; gabapentin; Ativan; hydroxychloroquine; Vicodin Provider's Orders Dermatology - ICD10: M32.9 - Refer to [facility] Dermatology for evaluation and treatment of boils/nodules Hand Signature: Date(s): Electronic Signature(s) Signed: 01/26/2023 3:42:44 PM By: Geralyn Corwin DO Signed: 01/26/2023 4:39:11 PM By: Redmond Pulling RN, BSN Previous Signature: 01/26/2023 10:33:57 AM Version By: Geralyn Corwin DO Entered By: Redmond Pulling on 01/26/2023 15:41:19 -------------------------------------------------------------------------------- Problem List Details Patient Name: Date of Service: Linda [name] HJA NEE L. 01/26/2023 9:30 A M Medical Record Number: [id number] Patient Account Number: [id number] Date of Birth/Sex: Treating RN: Apr 11, 1996 (27 y.o. F) Primary Care Provider: PCP, NO Other Clinician: Referring Provider: Treating Provider/Extender: Doreene Eland in Treatment: 0 Active Problems ICD-10 Encounter Code Description Active Date  MDM Diagnosis L93.0 Discoid lupus erythematosus 01/26/2023 No Yes N18.6 End stage renal disease 01/26/2023 No Yes M06.9 Rheumatoid arthritis, unspecified 01/26/2023 No Yes L30.8 Other specified dermatitis 01/26/2023 No Yes Inactive Problems Resolved Problems Electronic Signature(s) Signed: 01/26/2023 10:33:57 AM By: Geralyn Corwin DO Entered By: Geralyn Corwin on 01/26/2023 10:21:27 Regenia Skeeter ([id number]) [id number] Page 4 of 8 -------------------------------------------------------------------------------- Progress Note Details Patient Name: Date of Service: Linda [name] NEE L. 01/26/2023 9:30 A M Medical Record Number: [id number] Patient Account Number: [id number] Date of Birth/Sex: Treating RN: 28-Jun-1996 (27 y.o. F) Primary Care Provider: PCP, NO Other Clinician: Referring Provider: Treating Provider/Extender: Doreene Eland in Treatment: 0 Subjective Chief Complaint Information obtained from Patient 01/26/2023; cyst like lesions throughout her arms and legs History of Present Illness (HPI) 01/26/2023 Ms. Linda [name] Is a 27 year old female with a past medical history of discoid lupus erythematosus, end-stage renal disease on hemodialysis and rheumatoid arthritis that presents the clinic for a 6-55-month history of boils throughout her arms and legs. She states that the areas come to ahead and then open and drain. Eventually fell back up. She denies increased warmth, erythema or purulent drainage. She denies systemic signs of infection. She has seen rheumatology for this issue 6/7 months ago and was started on a medication although she does not know the name of it. This was eventually stopped. She has not seen dermatology for this issue. She has no obvious open wounds. Patient History Information obtained from Patient. Allergies lisinopril, gabapentin, Ativan, hydroxychloroquine, Vicodin Family  History Diabetes - Paternal Grandparents, Heart Disease - Mother, Hypertension - Mother, Kidney Disease - Mother, No family history of Cancer, Lung Disease, Seizures, Stroke, Thyroid Problems, Tuberculosis. Social History Current every day smoker, Marital Status - Single, Alcohol Use - Never, Drug Use - No History, Caffeine Use - Never. Medical History Eyes Denies history of Cataracts, Glaucoma, Optic Neuritis Ear/Nose/Mouth/Throat Denies history of Chronic sinus problems/congestion, Middle ear problems Hematologic/Lymphatic Patient has history of Anemia Denies history of Hemophilia, Human Immunodeficiency Virus, Lymphedema, Sickle Cell Disease Respiratory Denies history of Aspiration, Asthma, Chronic Obstructive Pulmonary Disease (COPD), Pneumothorax, Sleep Apnea, Tuberculosis Cardiovascular Patient has history of Congestive Heart Failure, Hypertension Denies history of Angina, Coronary Artery Disease, Deep Vein Thrombosis, Hypotension, Myocardial Infarction, Peripheral Arterial Disease, Peripheral Venous Disease, Phlebitis, Vasculitis Gastrointestinal Denies history of Cirrhosis , Colitis, Crohns, Hepatitis A, Hepatitis B, Hepatitis C Endocrine Denies history of Type I Diabetes, Type II Diabetes Genitourinary Patient has history of End Stage Renal Disease - ckd stage V Immunological Patient has history of Lupus  Erythematosus Denies history of Raynauds, Scleroderma Integumentary (Skin) Denies history of History of Burn Musculoskeletal Denies history of Gout, Rheumatoid Arthritis, Osteoarthritis, Osteomyelitis Neurologic Denies history of Dementia, Neuropathy, Quadriplegia, Paraplegia, Seizure Disorder Psychiatric Denies history of Anorexia/bulimia, Confinement Anxiety Hospitalization/Surgery History - dialysis graft jan 2020. Medical A Surgical History Notes nd Respiratory pulmonary edema Cardiovascular calciphylaxis Gastrointestinal GERD Genitourinary dialysis AVALEI, BELD ([id number]) [id number] Page 5 of 8 Review of Systems (ROS) Constitutional Symptoms (General Health) Denies complaints or symptoms of Fatigue, Fever, Chills, Marked Weight Change. Eyes Complains or has symptoms of Glasses / Contacts. Denies complaints or symptoms of Dry Eyes, Vision Changes. Ear/Nose/Mouth/Throat Denies complaints or symptoms of Chronic sinus problems or rhinitis. Respiratory Denies complaints or symptoms of Chronic or frequent coughs, Shortness of Breath. Gastrointestinal Denies complaints or symptoms of Frequent diarrhea, Nausea, Vomiting. Endocrine Denies complaints or symptoms of Heat/cold intolerance. Genitourinary Denies complaints or symptoms of Frequent urination. Integumentary (Skin) Complains or has symptoms of Wounds. Musculoskeletal Denies complaints or symptoms of Muscle Pain, Muscle Weakness. Neurologic Denies complaints or symptoms of Numbness/parasthesias. Psychiatric Denies complaints or symptoms of Claustrophobia. Objective Constitutional respirations regular, non-labored and within target range for patient.. Vitals Time Taken: 9:40 AM, Height: 66 in, Source: Stated, Weight: 170 lbs, Source: Stated, BMI: 27.4, Temperature: 98.1 F, Pulse: 86 bpm, Respiratory Rate: 18 breaths/min, Blood Pressure: 190/122 mmHg. Psychiatric pleasant and cooperative. General Notes: Throughout the arms and legs there are multiple large bumps that appear cystlike. No increased warmth, erythema or purulent drainage. Assessment Active Problems ICD-10 Discoid lupus erythematosus End stage renal disease Rheumatoid arthritis, unspecified Other specified dermatitis Patient presents with a 44-month history of boils/cysts to her legs and arms bilaterally. Fortunately she has no open wounds. No signs of infection. At this time I recommended following back up with her dermatologist and rheumatologist. I am concerned this is related to  her underlying lupus/rheumatoid arthritis. She may follow-up in our wound care center as needed. Plan Discharge From [facility] Services: Discharge from Wound Care Center - Follow up with Dermatology and Rheumatology Consults ordered were: Dermatology - Refer to [facility] Dermatology for evaluation and treatment of boils/nodules 1. Follow back up with dermatology and rheumatology 2. Follow up in the wound care center as needed Electronic Signature(s) Signed: 03/01/2023 2:27:45 PM By: Isidor Holts ([id number]) [id number] Page 6 of 8 Signed: 03/01/2023 2:57:08 PM By: Geralyn Corwin DO Previous Signature: 01/28/2023 9:31:56 AM Version By: Shawn Stall RN, BSN Previous Signature: 01/28/2023 11:25:14 AM Version By: Geralyn Corwin DO Previous Signature: 01/26/2023 10:33:57 AM Version By: Geralyn Corwin DO Entered By: Pearletha Alfred on 03/01/2023 14:27:45 -------------------------------------------------------------------------------- HxROS Details Patient Name: Date of Service: [name], [name] 01/26/2023 9:30 A M Medical Record Number: [id number] Patient Account Number: [id number] Date of Birth/Sex: Treating RN: 02/01/1996 (27 y.o. Orville Govern Primary Care Provider: PCP, NO Other Clinician: Referring Provider: Treating Provider/Extender: Doreene Eland in Treatment: 0 Information Obtained From Patient Constitutional Symptoms (General Health) Complaints and Symptoms: Negative for: Fatigue; Fever; Chills; Marked Weight Change Eyes Complaints and Symptoms: Positive for: Glasses / Contacts Negative for: Dry Eyes; Vision Changes Medical History: Negative for: Cataracts; Glaucoma; Optic Neuritis Ear/Nose/Mouth/Throat Complaints and Symptoms: Negative for: Chronic sinus problems or rhinitis Medical History: Negative for: Chronic sinus problems/congestion; Middle ear problems Respiratory Complaints and  Symptoms: Negative for: Chronic or frequent coughs; Shortness of Breath Medical History: Negative for: Aspiration; Asthma; Chronic Obstructive Pulmonary Disease (COPD); Pneumothorax; Sleep Apnea; Tuberculosis Past Medical  History Notes: pulmonary edema Gastrointestinal Complaints and Symptoms: Negative for: Frequent diarrhea; Nausea; Vomiting Medical History: Negative for: Cirrhosis ; Colitis; Crohns; Hepatitis A; Hepatitis B; Hepatitis C Past Medical History Notes: GERD Endocrine Complaints and Symptoms: Negative for: Heat/cold intolerance Medical History: Negative for: Type I Diabetes; Type II Diabetes Genitourinary Complaints and Symptoms: Negative for: Frequent urination Medical HistorySHAWNTAYA, MCCRELESS ([id number]) [id number] Page 7 of 8 Positive for: End Stage Renal Disease - ckd stage V Past Medical History Notes: dialysis Integumentary (Skin) Complaints and Symptoms: Positive for: Wounds Medical History: Negative for: History of Burn Musculoskeletal Complaints and Symptoms: Negative for: Muscle Pain; Muscle Weakness Medical History: Negative for: Gout; Rheumatoid Arthritis; Osteoarthritis; Osteomyelitis Neurologic Complaints and Symptoms: Negative for: Numbness/parasthesias Medical History: Negative for: Dementia; Neuropathy; Quadriplegia; Paraplegia; Seizure Disorder Psychiatric Complaints and Symptoms: Negative for: Claustrophobia Medical History: Negative for: Anorexia/bulimia; Confinement Anxiety Hematologic/Lymphatic Medical History: Positive for: Anemia Negative for: Hemophilia; Human Immunodeficiency Virus; Lymphedema; Sickle Cell Disease Cardiovascular Medical History: Positive for: Congestive Heart Failure; Hypertension Negative for: Angina; Coronary Artery Disease; Deep Vein Thrombosis; Hypotension; Myocardial Infarction; Peripheral Arterial Disease; Peripheral Venous Disease; Phlebitis; Vasculitis Past Medical  History Notes: calciphylaxis Immunological Medical History: Positive for: Lupus Erythematosus Negative for: Raynauds; Scleroderma Oncologic Immunizations Pneumococcal Vaccine: Received Pneumococcal Vaccination: No Implantable Devices Yes Hospitalization / Surgery History Type of Hospitalization/Surgery dialysis graft jan 2020 Family and Social History Cancer: No; Diabetes: Yes - Paternal Grandparents; Heart Disease: Yes - Mother; Hypertension: Yes - Mother; Kidney Disease: Yes - Mother; Lung Disease: No; Seizures: No; Stroke: No; Thyroid Problems: No; Tuberculosis: No; Current every day smoker; Marital Status - Single; Alcohol Use: Never; Drug Use: No History; Caffeine Use: Never; Financial Concerns: No; Food, Clothing or Shelter Needs: No; Support System Lacking: No; Transportation Concerns: Yes - getting to appointments Electronic Signature(s) Signed: 01/26/2023 10:33:57 AM By: Carmelina Peal, Kallie Locks ([id number]) [id number] Page 8 of 8 Signed: 01/26/2023 4:39:11 PM By: Redmond Pulling RN, BSN Signed: 01/28/2023 4:41:57 PM By: Thayer Dallas Entered By: Thayer Dallas on 01/26/2023 09:51:56 -------------------------------------------------------------------------------- SuperBill Details Patient Name: Date of Service: [name], [name] 01/26/2023 Medical Record Number: [id number] Patient Account Number: [id number] Date of Birth/Sex: Treating RN: 12-Jan-1996 (26 y.o. Orville Govern Primary Care Provider: PCP, NO Other Clinician: Referring Provider: Treating Provider/Extender: Smith Mince Weeks in Treatment: 0 Diagnosis Coding ICD-10 Codes Code Description L93.0 Discoid lupus erythematosus N18.6 End stage renal disease M06.9 Rheumatoid arthritis, unspecified L30.8 Other specified dermatitis Facility Procedures : CPT4 Code: [id number] Description: 99213 - WOUND CARE VISIT-LEV 3 EST  PT Modifier: Quantity: 1 Physician Procedures : CPT4 Code Description Modifier [id number] WC PHYS LEVEL 3 NEW PT ICD-10 Diagnosis Description L93.0 Discoid lupus erythematosus N18.6 End stage renal disease M06.9 Rheumatoid arthritis, unspecified L30.8 Other specified dermatitis Quantity: 1 Electronic Signature(s) Signed: 01/26/2023 10:33:57 AM By: Geralyn Corwin DO Entered By: Geralyn Corwin on 01/26/2023 10:29:30

## 2023-03-03 DIAGNOSIS — E559 Vitamin D deficiency, unspecified: Secondary | ICD-10-CM | POA: Diagnosis not present

## 2023-03-03 DIAGNOSIS — M329 Systemic lupus erythematosus, unspecified: Secondary | ICD-10-CM | POA: Diagnosis not present

## 2023-03-03 DIAGNOSIS — G894 Chronic pain syndrome: Secondary | ICD-10-CM | POA: Diagnosis not present

## 2023-03-03 DIAGNOSIS — Z992 Dependence on renal dialysis: Secondary | ICD-10-CM | POA: Diagnosis not present

## 2023-03-03 DIAGNOSIS — G4700 Insomnia, unspecified: Secondary | ICD-10-CM | POA: Diagnosis not present

## 2023-03-03 DIAGNOSIS — N186 End stage renal disease: Secondary | ICD-10-CM | POA: Diagnosis not present

## 2023-03-03 DIAGNOSIS — Z79899 Other long term (current) drug therapy: Secondary | ICD-10-CM | POA: Diagnosis not present

## 2023-03-03 DIAGNOSIS — N2581 Secondary hyperparathyroidism of renal origin: Secondary | ICD-10-CM | POA: Diagnosis not present

## 2023-03-05 DIAGNOSIS — D509 Iron deficiency anemia, unspecified: Secondary | ICD-10-CM | POA: Diagnosis not present

## 2023-03-05 DIAGNOSIS — D631 Anemia in chronic kidney disease: Secondary | ICD-10-CM | POA: Diagnosis not present

## 2023-03-05 DIAGNOSIS — Z992 Dependence on renal dialysis: Secondary | ICD-10-CM | POA: Diagnosis not present

## 2023-03-05 DIAGNOSIS — N2581 Secondary hyperparathyroidism of renal origin: Secondary | ICD-10-CM | POA: Diagnosis not present

## 2023-03-05 DIAGNOSIS — R110 Nausea: Secondary | ICD-10-CM | POA: Diagnosis not present

## 2023-03-05 DIAGNOSIS — N186 End stage renal disease: Secondary | ICD-10-CM | POA: Diagnosis not present

## 2023-03-11 DIAGNOSIS — N186 End stage renal disease: Secondary | ICD-10-CM | POA: Diagnosis not present

## 2023-03-11 DIAGNOSIS — R110 Nausea: Secondary | ICD-10-CM | POA: Diagnosis not present

## 2023-03-11 DIAGNOSIS — Z992 Dependence on renal dialysis: Secondary | ICD-10-CM | POA: Diagnosis not present

## 2023-03-11 DIAGNOSIS — D631 Anemia in chronic kidney disease: Secondary | ICD-10-CM | POA: Diagnosis not present

## 2023-03-11 DIAGNOSIS — D509 Iron deficiency anemia, unspecified: Secondary | ICD-10-CM | POA: Diagnosis not present

## 2023-03-11 DIAGNOSIS — N2581 Secondary hyperparathyroidism of renal origin: Secondary | ICD-10-CM | POA: Diagnosis not present

## 2023-03-12 DIAGNOSIS — D631 Anemia in chronic kidney disease: Secondary | ICD-10-CM | POA: Diagnosis not present

## 2023-03-12 DIAGNOSIS — Z992 Dependence on renal dialysis: Secondary | ICD-10-CM | POA: Diagnosis not present

## 2023-03-12 DIAGNOSIS — N2581 Secondary hyperparathyroidism of renal origin: Secondary | ICD-10-CM | POA: Diagnosis not present

## 2023-03-12 DIAGNOSIS — R110 Nausea: Secondary | ICD-10-CM | POA: Diagnosis not present

## 2023-03-12 DIAGNOSIS — N186 End stage renal disease: Secondary | ICD-10-CM | POA: Diagnosis not present

## 2023-03-12 DIAGNOSIS — D509 Iron deficiency anemia, unspecified: Secondary | ICD-10-CM | POA: Diagnosis not present

## 2023-03-17 DIAGNOSIS — D631 Anemia in chronic kidney disease: Secondary | ICD-10-CM | POA: Diagnosis not present

## 2023-03-17 DIAGNOSIS — Z992 Dependence on renal dialysis: Secondary | ICD-10-CM | POA: Diagnosis not present

## 2023-03-17 DIAGNOSIS — N186 End stage renal disease: Secondary | ICD-10-CM | POA: Diagnosis not present

## 2023-03-17 DIAGNOSIS — D509 Iron deficiency anemia, unspecified: Secondary | ICD-10-CM | POA: Diagnosis not present

## 2023-03-17 DIAGNOSIS — R110 Nausea: Secondary | ICD-10-CM | POA: Diagnosis not present

## 2023-03-17 DIAGNOSIS — N2581 Secondary hyperparathyroidism of renal origin: Secondary | ICD-10-CM | POA: Diagnosis not present

## 2023-03-19 DIAGNOSIS — D509 Iron deficiency anemia, unspecified: Secondary | ICD-10-CM | POA: Diagnosis not present

## 2023-03-19 DIAGNOSIS — N2581 Secondary hyperparathyroidism of renal origin: Secondary | ICD-10-CM | POA: Diagnosis not present

## 2023-03-19 DIAGNOSIS — N186 End stage renal disease: Secondary | ICD-10-CM | POA: Diagnosis not present

## 2023-03-19 DIAGNOSIS — Z992 Dependence on renal dialysis: Secondary | ICD-10-CM | POA: Diagnosis not present

## 2023-03-19 DIAGNOSIS — R110 Nausea: Secondary | ICD-10-CM | POA: Diagnosis not present

## 2023-03-19 DIAGNOSIS — D631 Anemia in chronic kidney disease: Secondary | ICD-10-CM | POA: Diagnosis not present

## 2023-03-24 DIAGNOSIS — R110 Nausea: Secondary | ICD-10-CM | POA: Diagnosis not present

## 2023-03-24 DIAGNOSIS — Z992 Dependence on renal dialysis: Secondary | ICD-10-CM | POA: Diagnosis not present

## 2023-03-24 DIAGNOSIS — N186 End stage renal disease: Secondary | ICD-10-CM | POA: Diagnosis not present

## 2023-03-24 DIAGNOSIS — N2581 Secondary hyperparathyroidism of renal origin: Secondary | ICD-10-CM | POA: Diagnosis not present

## 2023-03-24 DIAGNOSIS — D631 Anemia in chronic kidney disease: Secondary | ICD-10-CM | POA: Diagnosis not present

## 2023-03-24 DIAGNOSIS — D509 Iron deficiency anemia, unspecified: Secondary | ICD-10-CM | POA: Diagnosis not present

## 2023-03-26 DIAGNOSIS — R110 Nausea: Secondary | ICD-10-CM | POA: Diagnosis not present

## 2023-03-26 DIAGNOSIS — D631 Anemia in chronic kidney disease: Secondary | ICD-10-CM | POA: Diagnosis not present

## 2023-03-26 DIAGNOSIS — N186 End stage renal disease: Secondary | ICD-10-CM | POA: Diagnosis not present

## 2023-03-26 DIAGNOSIS — Z992 Dependence on renal dialysis: Secondary | ICD-10-CM | POA: Diagnosis not present

## 2023-03-26 DIAGNOSIS — N2581 Secondary hyperparathyroidism of renal origin: Secondary | ICD-10-CM | POA: Diagnosis not present

## 2023-03-26 DIAGNOSIS — D509 Iron deficiency anemia, unspecified: Secondary | ICD-10-CM | POA: Diagnosis not present

## 2023-03-31 DIAGNOSIS — N2581 Secondary hyperparathyroidism of renal origin: Secondary | ICD-10-CM | POA: Diagnosis not present

## 2023-03-31 DIAGNOSIS — M3210 Systemic lupus erythematosus, organ or system involvement unspecified: Secondary | ICD-10-CM | POA: Diagnosis not present

## 2023-03-31 DIAGNOSIS — N186 End stage renal disease: Secondary | ICD-10-CM | POA: Diagnosis not present

## 2023-03-31 DIAGNOSIS — Z992 Dependence on renal dialysis: Secondary | ICD-10-CM | POA: Diagnosis not present

## 2023-03-31 DIAGNOSIS — D631 Anemia in chronic kidney disease: Secondary | ICD-10-CM | POA: Diagnosis not present

## 2023-03-31 DIAGNOSIS — D509 Iron deficiency anemia, unspecified: Secondary | ICD-10-CM | POA: Diagnosis not present

## 2023-03-31 DIAGNOSIS — R110 Nausea: Secondary | ICD-10-CM | POA: Diagnosis not present

## 2023-04-01 DIAGNOSIS — Z79899 Other long term (current) drug therapy: Secondary | ICD-10-CM | POA: Diagnosis not present

## 2023-04-01 DIAGNOSIS — G894 Chronic pain syndrome: Secondary | ICD-10-CM | POA: Diagnosis not present

## 2023-04-01 DIAGNOSIS — G629 Polyneuropathy, unspecified: Secondary | ICD-10-CM | POA: Diagnosis not present

## 2023-04-01 DIAGNOSIS — M329 Systemic lupus erythematosus, unspecified: Secondary | ICD-10-CM | POA: Diagnosis not present

## 2023-04-05 DIAGNOSIS — L910 Hypertrophic scar: Secondary | ICD-10-CM | POA: Diagnosis not present

## 2023-04-05 DIAGNOSIS — D509 Iron deficiency anemia, unspecified: Secondary | ICD-10-CM | POA: Diagnosis not present

## 2023-04-05 DIAGNOSIS — I110 Hypertensive heart disease with heart failure: Secondary | ICD-10-CM | POA: Diagnosis not present

## 2023-04-05 DIAGNOSIS — Z01818 Encounter for other preprocedural examination: Secondary | ICD-10-CM | POA: Diagnosis not present

## 2023-04-05 DIAGNOSIS — Z720 Tobacco use: Secondary | ICD-10-CM | POA: Diagnosis not present

## 2023-04-05 DIAGNOSIS — I509 Heart failure, unspecified: Secondary | ICD-10-CM | POA: Diagnosis not present

## 2023-04-07 DIAGNOSIS — N186 End stage renal disease: Secondary | ICD-10-CM | POA: Diagnosis not present

## 2023-04-07 DIAGNOSIS — D631 Anemia in chronic kidney disease: Secondary | ICD-10-CM | POA: Diagnosis not present

## 2023-04-07 DIAGNOSIS — R52 Pain, unspecified: Secondary | ICD-10-CM | POA: Diagnosis not present

## 2023-04-07 DIAGNOSIS — N2581 Secondary hyperparathyroidism of renal origin: Secondary | ICD-10-CM | POA: Diagnosis not present

## 2023-04-07 DIAGNOSIS — R110 Nausea: Secondary | ICD-10-CM | POA: Diagnosis not present

## 2023-04-07 DIAGNOSIS — Z992 Dependence on renal dialysis: Secondary | ICD-10-CM | POA: Diagnosis not present

## 2023-04-07 DIAGNOSIS — D509 Iron deficiency anemia, unspecified: Secondary | ICD-10-CM | POA: Diagnosis not present

## 2023-04-09 DIAGNOSIS — D631 Anemia in chronic kidney disease: Secondary | ICD-10-CM | POA: Diagnosis not present

## 2023-04-09 DIAGNOSIS — N186 End stage renal disease: Secondary | ICD-10-CM | POA: Diagnosis not present

## 2023-04-09 DIAGNOSIS — R110 Nausea: Secondary | ICD-10-CM | POA: Diagnosis not present

## 2023-04-09 DIAGNOSIS — N2581 Secondary hyperparathyroidism of renal origin: Secondary | ICD-10-CM | POA: Diagnosis not present

## 2023-04-09 DIAGNOSIS — R52 Pain, unspecified: Secondary | ICD-10-CM | POA: Diagnosis not present

## 2023-04-09 DIAGNOSIS — D509 Iron deficiency anemia, unspecified: Secondary | ICD-10-CM | POA: Diagnosis not present

## 2023-04-09 DIAGNOSIS — Z992 Dependence on renal dialysis: Secondary | ICD-10-CM | POA: Diagnosis not present

## 2023-04-12 DIAGNOSIS — D509 Iron deficiency anemia, unspecified: Secondary | ICD-10-CM | POA: Diagnosis not present

## 2023-04-12 DIAGNOSIS — R52 Pain, unspecified: Secondary | ICD-10-CM | POA: Diagnosis not present

## 2023-04-12 DIAGNOSIS — D631 Anemia in chronic kidney disease: Secondary | ICD-10-CM | POA: Diagnosis not present

## 2023-04-12 DIAGNOSIS — R110 Nausea: Secondary | ICD-10-CM | POA: Diagnosis not present

## 2023-04-12 DIAGNOSIS — Z992 Dependence on renal dialysis: Secondary | ICD-10-CM | POA: Diagnosis not present

## 2023-04-12 DIAGNOSIS — N2581 Secondary hyperparathyroidism of renal origin: Secondary | ICD-10-CM | POA: Diagnosis not present

## 2023-04-12 DIAGNOSIS — N186 End stage renal disease: Secondary | ICD-10-CM | POA: Diagnosis not present

## 2023-04-13 DIAGNOSIS — I5030 Unspecified diastolic (congestive) heart failure: Secondary | ICD-10-CM | POA: Diagnosis not present

## 2023-04-13 DIAGNOSIS — I132 Hypertensive heart and chronic kidney disease with heart failure and with stage 5 chronic kidney disease, or end stage renal disease: Secondary | ICD-10-CM | POA: Diagnosis not present

## 2023-04-13 DIAGNOSIS — I110 Hypertensive heart disease with heart failure: Secondary | ICD-10-CM | POA: Diagnosis not present

## 2023-04-13 DIAGNOSIS — L905 Scar conditions and fibrosis of skin: Secondary | ICD-10-CM | POA: Diagnosis not present

## 2023-04-13 DIAGNOSIS — N186 End stage renal disease: Secondary | ICD-10-CM | POA: Diagnosis not present

## 2023-04-13 DIAGNOSIS — L930 Discoid lupus erythematosus: Secondary | ICD-10-CM | POA: Diagnosis not present

## 2023-04-16 DIAGNOSIS — N186 End stage renal disease: Secondary | ICD-10-CM | POA: Diagnosis not present

## 2023-04-16 DIAGNOSIS — N2581 Secondary hyperparathyroidism of renal origin: Secondary | ICD-10-CM | POA: Diagnosis not present

## 2023-04-16 DIAGNOSIS — R110 Nausea: Secondary | ICD-10-CM | POA: Diagnosis not present

## 2023-04-16 DIAGNOSIS — D631 Anemia in chronic kidney disease: Secondary | ICD-10-CM | POA: Diagnosis not present

## 2023-04-16 DIAGNOSIS — D509 Iron deficiency anemia, unspecified: Secondary | ICD-10-CM | POA: Diagnosis not present

## 2023-04-16 DIAGNOSIS — Z992 Dependence on renal dialysis: Secondary | ICD-10-CM | POA: Diagnosis not present

## 2023-04-16 DIAGNOSIS — R52 Pain, unspecified: Secondary | ICD-10-CM | POA: Diagnosis not present

## 2023-04-21 DIAGNOSIS — Z992 Dependence on renal dialysis: Secondary | ICD-10-CM | POA: Diagnosis not present

## 2023-04-21 DIAGNOSIS — D509 Iron deficiency anemia, unspecified: Secondary | ICD-10-CM | POA: Diagnosis not present

## 2023-04-21 DIAGNOSIS — R110 Nausea: Secondary | ICD-10-CM | POA: Diagnosis not present

## 2023-04-21 DIAGNOSIS — N2581 Secondary hyperparathyroidism of renal origin: Secondary | ICD-10-CM | POA: Diagnosis not present

## 2023-04-21 DIAGNOSIS — D631 Anemia in chronic kidney disease: Secondary | ICD-10-CM | POA: Diagnosis not present

## 2023-04-21 DIAGNOSIS — R52 Pain, unspecified: Secondary | ICD-10-CM | POA: Diagnosis not present

## 2023-04-21 DIAGNOSIS — N186 End stage renal disease: Secondary | ICD-10-CM | POA: Diagnosis not present

## 2023-04-26 DIAGNOSIS — Z09 Encounter for follow-up examination after completed treatment for conditions other than malignant neoplasm: Secondary | ICD-10-CM | POA: Diagnosis not present

## 2023-04-28 DIAGNOSIS — N2581 Secondary hyperparathyroidism of renal origin: Secondary | ICD-10-CM | POA: Diagnosis not present

## 2023-04-28 DIAGNOSIS — D509 Iron deficiency anemia, unspecified: Secondary | ICD-10-CM | POA: Diagnosis not present

## 2023-04-28 DIAGNOSIS — Z992 Dependence on renal dialysis: Secondary | ICD-10-CM | POA: Diagnosis not present

## 2023-04-28 DIAGNOSIS — R110 Nausea: Secondary | ICD-10-CM | POA: Diagnosis not present

## 2023-04-28 DIAGNOSIS — D631 Anemia in chronic kidney disease: Secondary | ICD-10-CM | POA: Diagnosis not present

## 2023-04-28 DIAGNOSIS — N186 End stage renal disease: Secondary | ICD-10-CM | POA: Diagnosis not present

## 2023-04-28 DIAGNOSIS — R52 Pain, unspecified: Secondary | ICD-10-CM | POA: Diagnosis not present

## 2023-05-01 DIAGNOSIS — Z992 Dependence on renal dialysis: Secondary | ICD-10-CM | POA: Diagnosis not present

## 2023-05-01 DIAGNOSIS — M3210 Systemic lupus erythematosus, organ or system involvement unspecified: Secondary | ICD-10-CM | POA: Diagnosis not present

## 2023-05-01 DIAGNOSIS — N186 End stage renal disease: Secondary | ICD-10-CM | POA: Diagnosis not present

## 2023-05-03 DIAGNOSIS — Z992 Dependence on renal dialysis: Secondary | ICD-10-CM | POA: Diagnosis not present

## 2023-05-03 DIAGNOSIS — D509 Iron deficiency anemia, unspecified: Secondary | ICD-10-CM | POA: Diagnosis not present

## 2023-05-03 DIAGNOSIS — N186 End stage renal disease: Secondary | ICD-10-CM | POA: Diagnosis not present

## 2023-05-03 DIAGNOSIS — D631 Anemia in chronic kidney disease: Secondary | ICD-10-CM | POA: Diagnosis not present

## 2023-05-03 DIAGNOSIS — R110 Nausea: Secondary | ICD-10-CM | POA: Diagnosis not present

## 2023-05-03 DIAGNOSIS — N2581 Secondary hyperparathyroidism of renal origin: Secondary | ICD-10-CM | POA: Diagnosis not present

## 2023-05-04 DIAGNOSIS — Z79899 Other long term (current) drug therapy: Secondary | ICD-10-CM | POA: Diagnosis not present

## 2023-05-04 DIAGNOSIS — R030 Elevated blood-pressure reading, without diagnosis of hypertension: Secondary | ICD-10-CM | POA: Diagnosis not present

## 2023-05-04 DIAGNOSIS — G894 Chronic pain syndrome: Secondary | ICD-10-CM | POA: Diagnosis not present

## 2023-05-04 DIAGNOSIS — G629 Polyneuropathy, unspecified: Secondary | ICD-10-CM | POA: Diagnosis not present

## 2023-05-04 DIAGNOSIS — M329 Systemic lupus erythematosus, unspecified: Secondary | ICD-10-CM | POA: Diagnosis not present

## 2023-05-05 DIAGNOSIS — N2581 Secondary hyperparathyroidism of renal origin: Secondary | ICD-10-CM | POA: Diagnosis not present

## 2023-05-05 DIAGNOSIS — R110 Nausea: Secondary | ICD-10-CM | POA: Diagnosis not present

## 2023-05-05 DIAGNOSIS — Z992 Dependence on renal dialysis: Secondary | ICD-10-CM | POA: Diagnosis not present

## 2023-05-05 DIAGNOSIS — N186 End stage renal disease: Secondary | ICD-10-CM | POA: Diagnosis not present

## 2023-05-05 DIAGNOSIS — D509 Iron deficiency anemia, unspecified: Secondary | ICD-10-CM | POA: Diagnosis not present

## 2023-05-05 DIAGNOSIS — D631 Anemia in chronic kidney disease: Secondary | ICD-10-CM | POA: Diagnosis not present

## 2023-05-07 DIAGNOSIS — Z79899 Other long term (current) drug therapy: Secondary | ICD-10-CM | POA: Diagnosis not present

## 2023-05-12 DIAGNOSIS — D631 Anemia in chronic kidney disease: Secondary | ICD-10-CM | POA: Diagnosis not present

## 2023-05-12 DIAGNOSIS — N186 End stage renal disease: Secondary | ICD-10-CM | POA: Diagnosis not present

## 2023-05-12 DIAGNOSIS — Z992 Dependence on renal dialysis: Secondary | ICD-10-CM | POA: Diagnosis not present

## 2023-05-12 DIAGNOSIS — R110 Nausea: Secondary | ICD-10-CM | POA: Diagnosis not present

## 2023-05-12 DIAGNOSIS — N2581 Secondary hyperparathyroidism of renal origin: Secondary | ICD-10-CM | POA: Diagnosis not present

## 2023-05-12 DIAGNOSIS — D509 Iron deficiency anemia, unspecified: Secondary | ICD-10-CM | POA: Diagnosis not present

## 2023-05-14 DIAGNOSIS — R110 Nausea: Secondary | ICD-10-CM | POA: Diagnosis not present

## 2023-05-14 DIAGNOSIS — D631 Anemia in chronic kidney disease: Secondary | ICD-10-CM | POA: Diagnosis not present

## 2023-05-14 DIAGNOSIS — Z992 Dependence on renal dialysis: Secondary | ICD-10-CM | POA: Diagnosis not present

## 2023-05-14 DIAGNOSIS — D509 Iron deficiency anemia, unspecified: Secondary | ICD-10-CM | POA: Diagnosis not present

## 2023-05-14 DIAGNOSIS — N2581 Secondary hyperparathyroidism of renal origin: Secondary | ICD-10-CM | POA: Diagnosis not present

## 2023-05-14 DIAGNOSIS — N186 End stage renal disease: Secondary | ICD-10-CM | POA: Diagnosis not present

## 2023-05-21 DIAGNOSIS — Z992 Dependence on renal dialysis: Secondary | ICD-10-CM | POA: Diagnosis not present

## 2023-05-21 DIAGNOSIS — N2581 Secondary hyperparathyroidism of renal origin: Secondary | ICD-10-CM | POA: Diagnosis not present

## 2023-05-21 DIAGNOSIS — D631 Anemia in chronic kidney disease: Secondary | ICD-10-CM | POA: Diagnosis not present

## 2023-05-21 DIAGNOSIS — D509 Iron deficiency anemia, unspecified: Secondary | ICD-10-CM | POA: Diagnosis not present

## 2023-05-21 DIAGNOSIS — N186 End stage renal disease: Secondary | ICD-10-CM | POA: Diagnosis not present

## 2023-05-21 DIAGNOSIS — R110 Nausea: Secondary | ICD-10-CM | POA: Diagnosis not present

## 2023-05-24 ENCOUNTER — Ambulatory Visit: Payer: 59 | Admitting: Dermatology

## 2023-05-24 ENCOUNTER — Encounter: Payer: Self-pay | Admitting: Dermatology

## 2023-05-24 DIAGNOSIS — L930 Discoid lupus erythematosus: Secondary | ICD-10-CM | POA: Diagnosis not present

## 2023-05-24 DIAGNOSIS — R229 Localized swelling, mass and lump, unspecified: Secondary | ICD-10-CM | POA: Diagnosis not present

## 2023-05-24 MED ORDER — TRETINOIN 0.05 % EX CREA: TOPICAL_CREAM | CUTANEOUS | 0 refills | Status: DC

## 2023-05-24 MED ORDER — DOXYCYCLINE MONOHYDRATE 100 MG PO TABS: 100.0000 mg | ORAL_TABLET | Freq: Every day | ORAL | 1 refills | Status: DC

## 2023-05-24 MED ORDER — SAFETY SEAL MISCELLANEOUS MISC: 2 refills | Status: DC

## 2023-05-24 NOTE — Patient Instructions (Addendum)
Hello Linda [name],  Thank you for visiting my office today. Your dedication to managing your dermatological health, especially in relation to lupus, is greatly appreciated. Here is a summary of the key instructions we discussed during your consultation:  - Medications for Skin Care:   - Retin-A (Tretinoin) 0.05%: Apply three nights a week. Begin by applying Eucerin Hyaluronic Acid Serum to prevent dryness, followed by a pea-sized amount of tretinoin, and finish with your moisturizer (options include Cedar, CeraVe, or [employer]).   - Hydroquinone 12% with Kojic Acid: Use on the alternate nights when you are not applying tretinoin (Tuesday, Thursday, Saturday, and Sunday) to address pigmentation issues.  This will be sent to a specialty pharmacy called [facility].  - Oral Medication for Nodules:   - Doxycycline 100 mg: Take once daily with food, preferably at dinner, to help manage inflammation and tenderness associated with nodules.  - Procedure:   - Kenalog Injections: These are considered for shrinking nodules if there is no significant improvement with doxycycline within two months.  - Follow-Up Appointment:   - Please schedule a return visit in two months. This will allow [place] to evaluate the effectiveness of your treatments and discuss any further options.  - Additional Instructions:   - Ensure your face is freshly washed before your next appointment to facilitate a better examination.   - If you experience dryness from the treatments, take a break for a week and then resume at a reduced frequency of two nights a week.  We are committed to closely monitoring your progress and will adjust your treatment plan as necessary to ensure the best possible outcomes. Thank you once again for your visit today.  Warm regards,  Dr. Langston Reusing, Dermatologist    Important Information  Due to recent changes in healthcare laws, you may see results of your pathology and/or laboratory studies on  MyChart before the doctors have had a chance to review them. We understand that in some cases there may be results that are confusing or concerning to you. Please understand that not all results are received at the same time and often the doctors may need to interpret multiple results in order to provide you with the best plan of care or course of treatment. Therefore, we ask that you please give [place] 2 business days to thoroughly review all your results before contacting the office for clarification. Should we see a critical lab result, you will be contacted sooner.   If You Need Anything After Your Visit  If you have any questions or concerns for your doctor, please call our main line at [phone] If no one answers, please leave a voicemail as directed and we will return your call as soon as possible. Messages left after 4 pm will be answered the following business day.   You may also send [place] a message via MyChart. We typically respond to MyChart messages within 1-2 business days.  For prescription refills, please ask your pharmacy to contact our office. Our fax number is [phone].  If you have an urgent issue when the clinic is closed that cannot wait until the next business day, you can page your doctor at the number below.    Please note that while we do our best to be available for urgent issues outside of office hours, we are not available 24/7.   If you have an urgent issue and are unable to reach [place], you may choose to seek medical care at your doctor's office, retail  clinic, urgent care center, or emergency room.  If you have a medical emergency, please immediately call 911 or go to the emergency department. In the event of inclement weather, please call our main line at [phone] for an update on the status of any delays or closures.  Dermatology Medication Tips: Please keep the boxes that topical medications come in in order to help keep track of the instructions about where  and how to use these. Pharmacies typically print the medication instructions only on the boxes and not directly on the medication tubes.   If your medication is too expensive, please contact our office at [phone] or send [place] a message through MyChart.   We are unable to tell what your co-pay for medications will be in advance as this is different depending on your insurance coverage. However, we may be able to find a substitute medication at lower cost or fill out paperwork to get insurance to cover a needed medication.   If a prior authorization is required to get your medication covered by your insurance company, please allow [place] 1-2 business days to complete this process.  Drug prices often vary depending on where the prescription is filled and some pharmacies may offer cheaper prices.  The website www.goodrx.com contains coupons for medications through different pharmacies. The prices here do not account for what the cost may be with help from insurance (it may be cheaper with your insurance), but the website can give you the price if you did not use any insurance.  - You can print the associated coupon and take it with your prescription to the pharmacy.  - You may also stop by our office during regular business hours and pick up a GoodRx coupon card.  - If you need your prescription sent electronically to a different pharmacy, notify our office through [facility] or by phone at [phone]

## 2023-05-24 NOTE — Progress Notes (Signed)
   New Patient Visit   Subjective  Linda [name] is a 27 y.o. female who presents for the following: patient with lupus that has caused craters at her face, tried laser and it did not work. She had a fat transfer to face last month at [facility] plastic surgery. Patient would like to talk about hyperpigmentation.  Patient not taking any medication for her lupus currently. She sees rheumatology at [facility] and has been stable for > 1 year. Patient does get joint pain, open lesions when flared, enlarged heart. Diagnosed at 27 years old by blood work and biopsy with lupus at face that started as dark spots that would turn into sore that left craters.  Patient also c/o hard bumps that are painful on body at knees, elbows and buttocks. Sometimes what looks like a white rock will come out, sometimes small tan drainage. Patient was given TMC 0.1% cream to use at nodules on body but has been using at face.   The following portions of the chart were reviewed this encounter and updated as appropriate: medications, allergies, medical history  Review of Systems:  No other skin or systemic complaints except as noted in HPI or Assessment and Plan.  Objective  Well appearing patient in no apparent distress; mood and affect are within normal limits.    A focused examination was performed of the following areas: Knees, elbows, face, buttocks  Relevant exam findings are noted in the Assessment and Plan.  knees, elbows, buttocks Very firm subcutaneous nodules b/l extensor surface elbow, knee, shins, hyperpigmented macules b/l extremities  face Box car scarring at face and hyperpigmented macules / patches    Assessment & Plan      Calcified nodule knees, elbows, buttocks  Start doxycycline 100 mg once daily with food.   Doxycycline should be taken with food to prevent nausea. Do not lay down for 30 minutes after taking. Be cautious with sun exposure and use good sun protection while on  this medication. Pregnant women should not take this medication.    Discoid lupus erythematosus face  Start Retin-A 0.05% three nights a week (Monday, Wednesday and Friday) alternating nightly with hydroquinone.  Prior to Retin-A, apply hyaluronic acid. Start MedRock Hy-Glow. Use on the alternate nights when you are not applying tretinoin (Tuesday, Thursday, Saturday, and Sunday)      Return in about 2 months (around 07/24/2023) for calcified nodule, lupus/hyperpigmentation follow up.  Linda [name], RMA, am acting as scribe for [employer], DO .   Documentation: I have reviewed the above documentation for accuracy and completeness, and I agree with the above.  Linda Reusing, DO

## 2023-05-26 DIAGNOSIS — D631 Anemia in chronic kidney disease: Secondary | ICD-10-CM | POA: Diagnosis not present

## 2023-05-26 DIAGNOSIS — N186 End stage renal disease: Secondary | ICD-10-CM | POA: Diagnosis not present

## 2023-05-26 DIAGNOSIS — Z992 Dependence on renal dialysis: Secondary | ICD-10-CM | POA: Diagnosis not present

## 2023-05-26 DIAGNOSIS — D509 Iron deficiency anemia, unspecified: Secondary | ICD-10-CM | POA: Diagnosis not present

## 2023-05-26 DIAGNOSIS — N2581 Secondary hyperparathyroidism of renal origin: Secondary | ICD-10-CM | POA: Diagnosis not present

## 2023-05-26 DIAGNOSIS — R110 Nausea: Secondary | ICD-10-CM | POA: Diagnosis not present

## 2023-05-31 DIAGNOSIS — M3210 Systemic lupus erythematosus, organ or system involvement unspecified: Secondary | ICD-10-CM | POA: Diagnosis not present

## 2023-05-31 DIAGNOSIS — N186 End stage renal disease: Secondary | ICD-10-CM | POA: Diagnosis not present

## 2023-05-31 DIAGNOSIS — Z992 Dependence on renal dialysis: Secondary | ICD-10-CM | POA: Diagnosis not present

## 2023-06-02 DIAGNOSIS — D631 Anemia in chronic kidney disease: Secondary | ICD-10-CM | POA: Diagnosis not present

## 2023-06-02 DIAGNOSIS — N186 End stage renal disease: Secondary | ICD-10-CM | POA: Diagnosis not present

## 2023-06-02 DIAGNOSIS — R110 Nausea: Secondary | ICD-10-CM | POA: Diagnosis not present

## 2023-06-02 DIAGNOSIS — N2581 Secondary hyperparathyroidism of renal origin: Secondary | ICD-10-CM | POA: Diagnosis not present

## 2023-06-02 DIAGNOSIS — D509 Iron deficiency anemia, unspecified: Secondary | ICD-10-CM | POA: Diagnosis not present

## 2023-06-02 DIAGNOSIS — Z992 Dependence on renal dialysis: Secondary | ICD-10-CM | POA: Diagnosis not present

## 2023-06-03 DIAGNOSIS — I10 Essential (primary) hypertension: Secondary | ICD-10-CM | POA: Diagnosis not present

## 2023-06-03 DIAGNOSIS — M329 Systemic lupus erythematosus, unspecified: Secondary | ICD-10-CM | POA: Diagnosis not present

## 2023-06-03 DIAGNOSIS — N186 End stage renal disease: Secondary | ICD-10-CM | POA: Diagnosis not present

## 2023-06-03 DIAGNOSIS — Z79899 Other long term (current) drug therapy: Secondary | ICD-10-CM | POA: Diagnosis not present

## 2023-06-03 DIAGNOSIS — E7800 Pure hypercholesterolemia, unspecified: Secondary | ICD-10-CM | POA: Diagnosis not present

## 2023-06-03 DIAGNOSIS — E559 Vitamin D deficiency, unspecified: Secondary | ICD-10-CM | POA: Diagnosis not present

## 2023-06-03 DIAGNOSIS — Z992 Dependence on renal dialysis: Secondary | ICD-10-CM | POA: Diagnosis not present

## 2023-06-07 DIAGNOSIS — Z992 Dependence on renal dialysis: Secondary | ICD-10-CM | POA: Diagnosis not present

## 2023-06-07 DIAGNOSIS — R110 Nausea: Secondary | ICD-10-CM | POA: Diagnosis not present

## 2023-06-07 DIAGNOSIS — N2581 Secondary hyperparathyroidism of renal origin: Secondary | ICD-10-CM | POA: Diagnosis not present

## 2023-06-07 DIAGNOSIS — N186 End stage renal disease: Secondary | ICD-10-CM | POA: Diagnosis not present

## 2023-06-07 DIAGNOSIS — Z79899 Other long term (current) drug therapy: Secondary | ICD-10-CM | POA: Diagnosis not present

## 2023-06-07 DIAGNOSIS — D631 Anemia in chronic kidney disease: Secondary | ICD-10-CM | POA: Diagnosis not present

## 2023-06-07 DIAGNOSIS — D509 Iron deficiency anemia, unspecified: Secondary | ICD-10-CM | POA: Diagnosis not present

## 2023-06-11 DIAGNOSIS — R110 Nausea: Secondary | ICD-10-CM | POA: Diagnosis not present

## 2023-06-11 DIAGNOSIS — D631 Anemia in chronic kidney disease: Secondary | ICD-10-CM | POA: Diagnosis not present

## 2023-06-11 DIAGNOSIS — D509 Iron deficiency anemia, unspecified: Secondary | ICD-10-CM | POA: Diagnosis not present

## 2023-06-11 DIAGNOSIS — N186 End stage renal disease: Secondary | ICD-10-CM | POA: Diagnosis not present

## 2023-06-11 DIAGNOSIS — N2581 Secondary hyperparathyroidism of renal origin: Secondary | ICD-10-CM | POA: Diagnosis not present

## 2023-06-11 DIAGNOSIS — Z992 Dependence on renal dialysis: Secondary | ICD-10-CM | POA: Diagnosis not present

## 2023-06-14 DIAGNOSIS — Z992 Dependence on renal dialysis: Secondary | ICD-10-CM | POA: Diagnosis not present

## 2023-06-14 DIAGNOSIS — N2581 Secondary hyperparathyroidism of renal origin: Secondary | ICD-10-CM | POA: Diagnosis not present

## 2023-06-14 DIAGNOSIS — D509 Iron deficiency anemia, unspecified: Secondary | ICD-10-CM | POA: Diagnosis not present

## 2023-06-14 DIAGNOSIS — N186 End stage renal disease: Secondary | ICD-10-CM | POA: Diagnosis not present

## 2023-06-14 DIAGNOSIS — D631 Anemia in chronic kidney disease: Secondary | ICD-10-CM | POA: Diagnosis not present

## 2023-06-14 DIAGNOSIS — R110 Nausea: Secondary | ICD-10-CM | POA: Diagnosis not present

## 2023-06-18 DIAGNOSIS — R110 Nausea: Secondary | ICD-10-CM | POA: Diagnosis not present

## 2023-06-18 DIAGNOSIS — D631 Anemia in chronic kidney disease: Secondary | ICD-10-CM | POA: Diagnosis not present

## 2023-06-18 DIAGNOSIS — D509 Iron deficiency anemia, unspecified: Secondary | ICD-10-CM | POA: Diagnosis not present

## 2023-06-18 DIAGNOSIS — N2581 Secondary hyperparathyroidism of renal origin: Secondary | ICD-10-CM | POA: Diagnosis not present

## 2023-06-18 DIAGNOSIS — N186 End stage renal disease: Secondary | ICD-10-CM | POA: Diagnosis not present

## 2023-06-18 DIAGNOSIS — Z992 Dependence on renal dialysis: Secondary | ICD-10-CM | POA: Diagnosis not present

## 2023-06-23 DIAGNOSIS — R110 Nausea: Secondary | ICD-10-CM | POA: Diagnosis not present

## 2023-06-23 DIAGNOSIS — N2581 Secondary hyperparathyroidism of renal origin: Secondary | ICD-10-CM | POA: Diagnosis not present

## 2023-06-23 DIAGNOSIS — D631 Anemia in chronic kidney disease: Secondary | ICD-10-CM | POA: Diagnosis not present

## 2023-06-23 DIAGNOSIS — Z992 Dependence on renal dialysis: Secondary | ICD-10-CM | POA: Diagnosis not present

## 2023-06-23 DIAGNOSIS — N186 End stage renal disease: Secondary | ICD-10-CM | POA: Diagnosis not present

## 2023-06-23 DIAGNOSIS — D509 Iron deficiency anemia, unspecified: Secondary | ICD-10-CM | POA: Diagnosis not present

## 2023-06-28 DIAGNOSIS — Z992 Dependence on renal dialysis: Secondary | ICD-10-CM | POA: Diagnosis not present

## 2023-06-28 DIAGNOSIS — N2581 Secondary hyperparathyroidism of renal origin: Secondary | ICD-10-CM | POA: Diagnosis not present

## 2023-06-28 DIAGNOSIS — D631 Anemia in chronic kidney disease: Secondary | ICD-10-CM | POA: Diagnosis not present

## 2023-06-28 DIAGNOSIS — R110 Nausea: Secondary | ICD-10-CM | POA: Diagnosis not present

## 2023-06-28 DIAGNOSIS — N186 End stage renal disease: Secondary | ICD-10-CM | POA: Diagnosis not present

## 2023-06-28 DIAGNOSIS — D509 Iron deficiency anemia, unspecified: Secondary | ICD-10-CM | POA: Diagnosis not present

## 2023-06-29 ENCOUNTER — Other Ambulatory Visit: Payer: Self-pay

## 2023-06-29 MED ORDER — RETIN-A 0.05 % EX CREA: TOPICAL_CREAM | CUTANEOUS | 2 refills | Status: DC

## 2023-06-30 DIAGNOSIS — D509 Iron deficiency anemia, unspecified: Secondary | ICD-10-CM | POA: Diagnosis not present

## 2023-06-30 DIAGNOSIS — R110 Nausea: Secondary | ICD-10-CM | POA: Diagnosis not present

## 2023-06-30 DIAGNOSIS — D631 Anemia in chronic kidney disease: Secondary | ICD-10-CM | POA: Diagnosis not present

## 2023-06-30 DIAGNOSIS — N2581 Secondary hyperparathyroidism of renal origin: Secondary | ICD-10-CM | POA: Diagnosis not present

## 2023-06-30 DIAGNOSIS — N186 End stage renal disease: Secondary | ICD-10-CM | POA: Diagnosis not present

## 2023-06-30 DIAGNOSIS — Z992 Dependence on renal dialysis: Secondary | ICD-10-CM | POA: Diagnosis not present

## 2023-07-01 DIAGNOSIS — Z992 Dependence on renal dialysis: Secondary | ICD-10-CM | POA: Diagnosis not present

## 2023-07-01 DIAGNOSIS — M3210 Systemic lupus erythematosus, organ or system involvement unspecified: Secondary | ICD-10-CM | POA: Diagnosis not present

## 2023-07-01 DIAGNOSIS — N186 End stage renal disease: Secondary | ICD-10-CM | POA: Diagnosis not present

## 2023-07-02 DIAGNOSIS — Z91148 Patient's other noncompliance with medication regimen for other reason: Secondary | ICD-10-CM | POA: Diagnosis not present

## 2023-07-02 DIAGNOSIS — G894 Chronic pain syndrome: Secondary | ICD-10-CM | POA: Diagnosis not present

## 2023-07-02 DIAGNOSIS — G4700 Insomnia, unspecified: Secondary | ICD-10-CM | POA: Diagnosis not present

## 2023-07-02 DIAGNOSIS — Z79899 Other long term (current) drug therapy: Secondary | ICD-10-CM | POA: Diagnosis not present

## 2023-07-07 DIAGNOSIS — Z79899 Other long term (current) drug therapy: Secondary | ICD-10-CM | POA: Diagnosis not present

## 2023-07-09 DIAGNOSIS — N2581 Secondary hyperparathyroidism of renal origin: Secondary | ICD-10-CM | POA: Diagnosis not present

## 2023-07-09 DIAGNOSIS — R52 Pain, unspecified: Secondary | ICD-10-CM | POA: Diagnosis not present

## 2023-07-09 DIAGNOSIS — Z992 Dependence on renal dialysis: Secondary | ICD-10-CM | POA: Diagnosis not present

## 2023-07-09 DIAGNOSIS — D509 Iron deficiency anemia, unspecified: Secondary | ICD-10-CM | POA: Diagnosis not present

## 2023-07-09 DIAGNOSIS — D631 Anemia in chronic kidney disease: Secondary | ICD-10-CM | POA: Diagnosis not present

## 2023-07-09 DIAGNOSIS — N186 End stage renal disease: Secondary | ICD-10-CM | POA: Diagnosis not present

## 2023-07-12 DIAGNOSIS — D631 Anemia in chronic kidney disease: Secondary | ICD-10-CM | POA: Diagnosis not present

## 2023-07-12 DIAGNOSIS — N2581 Secondary hyperparathyroidism of renal origin: Secondary | ICD-10-CM | POA: Diagnosis not present

## 2023-07-12 DIAGNOSIS — Z992 Dependence on renal dialysis: Secondary | ICD-10-CM | POA: Diagnosis not present

## 2023-07-12 DIAGNOSIS — D509 Iron deficiency anemia, unspecified: Secondary | ICD-10-CM | POA: Diagnosis not present

## 2023-07-12 DIAGNOSIS — N186 End stage renal disease: Secondary | ICD-10-CM | POA: Diagnosis not present

## 2023-07-12 DIAGNOSIS — R52 Pain, unspecified: Secondary | ICD-10-CM | POA: Diagnosis not present

## 2023-07-14 DIAGNOSIS — D631 Anemia in chronic kidney disease: Secondary | ICD-10-CM | POA: Diagnosis not present

## 2023-07-14 DIAGNOSIS — N186 End stage renal disease: Secondary | ICD-10-CM | POA: Diagnosis not present

## 2023-07-14 DIAGNOSIS — D509 Iron deficiency anemia, unspecified: Secondary | ICD-10-CM | POA: Diagnosis not present

## 2023-07-14 DIAGNOSIS — Z992 Dependence on renal dialysis: Secondary | ICD-10-CM | POA: Diagnosis not present

## 2023-07-14 DIAGNOSIS — R52 Pain, unspecified: Secondary | ICD-10-CM | POA: Diagnosis not present

## 2023-07-14 DIAGNOSIS — N2581 Secondary hyperparathyroidism of renal origin: Secondary | ICD-10-CM | POA: Diagnosis not present

## 2023-07-19 DIAGNOSIS — N2581 Secondary hyperparathyroidism of renal origin: Secondary | ICD-10-CM | POA: Diagnosis not present

## 2023-07-19 DIAGNOSIS — R52 Pain, unspecified: Secondary | ICD-10-CM | POA: Diagnosis not present

## 2023-07-19 DIAGNOSIS — Z992 Dependence on renal dialysis: Secondary | ICD-10-CM | POA: Diagnosis not present

## 2023-07-19 DIAGNOSIS — D631 Anemia in chronic kidney disease: Secondary | ICD-10-CM | POA: Diagnosis not present

## 2023-07-19 DIAGNOSIS — D509 Iron deficiency anemia, unspecified: Secondary | ICD-10-CM | POA: Diagnosis not present

## 2023-07-19 DIAGNOSIS — N186 End stage renal disease: Secondary | ICD-10-CM | POA: Diagnosis not present

## 2023-07-23 DIAGNOSIS — R52 Pain, unspecified: Secondary | ICD-10-CM | POA: Diagnosis not present

## 2023-07-23 DIAGNOSIS — D631 Anemia in chronic kidney disease: Secondary | ICD-10-CM | POA: Diagnosis not present

## 2023-07-23 DIAGNOSIS — N186 End stage renal disease: Secondary | ICD-10-CM | POA: Diagnosis not present

## 2023-07-23 DIAGNOSIS — Z992 Dependence on renal dialysis: Secondary | ICD-10-CM | POA: Diagnosis not present

## 2023-07-23 DIAGNOSIS — N2581 Secondary hyperparathyroidism of renal origin: Secondary | ICD-10-CM | POA: Diagnosis not present

## 2023-07-23 DIAGNOSIS — D509 Iron deficiency anemia, unspecified: Secondary | ICD-10-CM | POA: Diagnosis not present

## 2023-07-27 ENCOUNTER — Ambulatory Visit: Payer: 59 | Admitting: Dermatology

## 2023-07-31 DIAGNOSIS — R52 Pain, unspecified: Secondary | ICD-10-CM | POA: Diagnosis not present

## 2023-07-31 DIAGNOSIS — N2581 Secondary hyperparathyroidism of renal origin: Secondary | ICD-10-CM | POA: Diagnosis not present

## 2023-07-31 DIAGNOSIS — D631 Anemia in chronic kidney disease: Secondary | ICD-10-CM | POA: Diagnosis not present

## 2023-07-31 DIAGNOSIS — Z992 Dependence on renal dialysis: Secondary | ICD-10-CM | POA: Diagnosis not present

## 2023-07-31 DIAGNOSIS — N186 End stage renal disease: Secondary | ICD-10-CM | POA: Diagnosis not present

## 2023-07-31 DIAGNOSIS — D509 Iron deficiency anemia, unspecified: Secondary | ICD-10-CM | POA: Diagnosis not present

## 2023-07-31 DIAGNOSIS — M3210 Systemic lupus erythematosus, organ or system involvement unspecified: Secondary | ICD-10-CM | POA: Diagnosis not present

## 2023-08-01 DIAGNOSIS — G894 Chronic pain syndrome: Secondary | ICD-10-CM | POA: Diagnosis not present

## 2023-08-01 DIAGNOSIS — Z79899 Other long term (current) drug therapy: Secondary | ICD-10-CM | POA: Diagnosis not present

## 2023-08-01 DIAGNOSIS — Z91148 Patient's other noncompliance with medication regimen for other reason: Secondary | ICD-10-CM | POA: Diagnosis not present

## 2023-08-02 DIAGNOSIS — N186 End stage renal disease: Secondary | ICD-10-CM | POA: Diagnosis not present

## 2023-08-02 DIAGNOSIS — D509 Iron deficiency anemia, unspecified: Secondary | ICD-10-CM | POA: Diagnosis not present

## 2023-08-02 DIAGNOSIS — R110 Nausea: Secondary | ICD-10-CM | POA: Diagnosis not present

## 2023-08-02 DIAGNOSIS — D631 Anemia in chronic kidney disease: Secondary | ICD-10-CM | POA: Diagnosis not present

## 2023-08-02 DIAGNOSIS — Z992 Dependence on renal dialysis: Secondary | ICD-10-CM | POA: Diagnosis not present

## 2023-08-02 DIAGNOSIS — N2581 Secondary hyperparathyroidism of renal origin: Secondary | ICD-10-CM | POA: Diagnosis not present

## 2023-08-04 DIAGNOSIS — Z992 Dependence on renal dialysis: Secondary | ICD-10-CM | POA: Diagnosis not present

## 2023-08-04 DIAGNOSIS — N2581 Secondary hyperparathyroidism of renal origin: Secondary | ICD-10-CM | POA: Diagnosis not present

## 2023-08-04 DIAGNOSIS — R110 Nausea: Secondary | ICD-10-CM | POA: Diagnosis not present

## 2023-08-04 DIAGNOSIS — D509 Iron deficiency anemia, unspecified: Secondary | ICD-10-CM | POA: Diagnosis not present

## 2023-08-04 DIAGNOSIS — D631 Anemia in chronic kidney disease: Secondary | ICD-10-CM | POA: Diagnosis not present

## 2023-08-04 DIAGNOSIS — N186 End stage renal disease: Secondary | ICD-10-CM | POA: Diagnosis not present

## 2023-08-04 DIAGNOSIS — Z79899 Other long term (current) drug therapy: Secondary | ICD-10-CM | POA: Diagnosis not present

## 2023-08-06 DIAGNOSIS — R110 Nausea: Secondary | ICD-10-CM | POA: Diagnosis not present

## 2023-08-06 DIAGNOSIS — N2581 Secondary hyperparathyroidism of renal origin: Secondary | ICD-10-CM | POA: Diagnosis not present

## 2023-08-06 DIAGNOSIS — D631 Anemia in chronic kidney disease: Secondary | ICD-10-CM | POA: Diagnosis not present

## 2023-08-06 DIAGNOSIS — Z992 Dependence on renal dialysis: Secondary | ICD-10-CM | POA: Diagnosis not present

## 2023-08-06 DIAGNOSIS — N186 End stage renal disease: Secondary | ICD-10-CM | POA: Diagnosis not present

## 2023-08-06 DIAGNOSIS — D509 Iron deficiency anemia, unspecified: Secondary | ICD-10-CM | POA: Diagnosis not present

## 2023-08-09 DIAGNOSIS — N2581 Secondary hyperparathyroidism of renal origin: Secondary | ICD-10-CM | POA: Diagnosis not present

## 2023-08-09 DIAGNOSIS — R110 Nausea: Secondary | ICD-10-CM | POA: Diagnosis not present

## 2023-08-09 DIAGNOSIS — Z992 Dependence on renal dialysis: Secondary | ICD-10-CM | POA: Diagnosis not present

## 2023-08-09 DIAGNOSIS — D631 Anemia in chronic kidney disease: Secondary | ICD-10-CM | POA: Diagnosis not present

## 2023-08-09 DIAGNOSIS — N186 End stage renal disease: Secondary | ICD-10-CM | POA: Diagnosis not present

## 2023-08-09 DIAGNOSIS — D509 Iron deficiency anemia, unspecified: Secondary | ICD-10-CM | POA: Diagnosis not present

## 2023-08-13 DIAGNOSIS — Z992 Dependence on renal dialysis: Secondary | ICD-10-CM | POA: Diagnosis not present

## 2023-08-13 DIAGNOSIS — D631 Anemia in chronic kidney disease: Secondary | ICD-10-CM | POA: Diagnosis not present

## 2023-08-13 DIAGNOSIS — N2581 Secondary hyperparathyroidism of renal origin: Secondary | ICD-10-CM | POA: Diagnosis not present

## 2023-08-13 DIAGNOSIS — N186 End stage renal disease: Secondary | ICD-10-CM | POA: Diagnosis not present

## 2023-08-13 DIAGNOSIS — R110 Nausea: Secondary | ICD-10-CM | POA: Diagnosis not present

## 2023-08-13 DIAGNOSIS — D509 Iron deficiency anemia, unspecified: Secondary | ICD-10-CM | POA: Diagnosis not present

## 2023-08-18 DIAGNOSIS — Z992 Dependence on renal dialysis: Secondary | ICD-10-CM | POA: Diagnosis not present

## 2023-08-18 DIAGNOSIS — R110 Nausea: Secondary | ICD-10-CM | POA: Diagnosis not present

## 2023-08-18 DIAGNOSIS — D509 Iron deficiency anemia, unspecified: Secondary | ICD-10-CM | POA: Diagnosis not present

## 2023-08-18 DIAGNOSIS — N186 End stage renal disease: Secondary | ICD-10-CM | POA: Diagnosis not present

## 2023-08-18 DIAGNOSIS — N2581 Secondary hyperparathyroidism of renal origin: Secondary | ICD-10-CM | POA: Diagnosis not present

## 2023-08-18 DIAGNOSIS — D631 Anemia in chronic kidney disease: Secondary | ICD-10-CM | POA: Diagnosis not present

## 2023-08-19 ENCOUNTER — Other Ambulatory Visit: Payer: Self-pay

## 2023-08-19 ENCOUNTER — Encounter (HOSPITAL_BASED_OUTPATIENT_CLINIC_OR_DEPARTMENT_OTHER): Payer: Self-pay | Admitting: Emergency Medicine

## 2023-08-19 ENCOUNTER — Emergency Department (HOSPITAL_BASED_OUTPATIENT_CLINIC_OR_DEPARTMENT_OTHER)
Admission: EM | Admit: 2023-08-19 | Discharge: 2023-08-19 | Discharge status: Against Medical Advice | Payer: 59 | Attending: Emergency Medicine | Admitting: Emergency Medicine

## 2023-08-19 DIAGNOSIS — Z5321 Procedure and treatment not carried out due to patient leaving prior to being seen by health care provider: Secondary | ICD-10-CM | POA: Diagnosis not present

## 2023-08-19 DIAGNOSIS — T50901A Poisoning by unspecified drugs, medicaments and biological substances, accidental (unintentional), initial encounter: Secondary | ICD-10-CM | POA: Insufficient documentation

## 2023-08-19 NOTE — ED Triage Notes (Addendum)
Friend from dialysis came over and gave her a pill. Feels like the pill was laced,  Cotton mouth and heart racing  Took pill around 6pm  Offered to call police, patient declined

## 2023-08-31 DIAGNOSIS — R110 Nausea: Secondary | ICD-10-CM | POA: Diagnosis not present

## 2023-08-31 DIAGNOSIS — D631 Anemia in chronic kidney disease: Secondary | ICD-10-CM | POA: Diagnosis not present

## 2023-08-31 DIAGNOSIS — N2581 Secondary hyperparathyroidism of renal origin: Secondary | ICD-10-CM | POA: Diagnosis not present

## 2023-08-31 DIAGNOSIS — N186 End stage renal disease: Secondary | ICD-10-CM | POA: Diagnosis not present

## 2023-08-31 DIAGNOSIS — D509 Iron deficiency anemia, unspecified: Secondary | ICD-10-CM | POA: Diagnosis not present

## 2023-08-31 DIAGNOSIS — Z992 Dependence on renal dialysis: Secondary | ICD-10-CM | POA: Diagnosis not present

## 2023-09-01 ENCOUNTER — Emergency Department (HOSPITAL_COMMUNITY): Payer: 59

## 2023-09-01 ENCOUNTER — Other Ambulatory Visit: Payer: Self-pay

## 2023-09-01 ENCOUNTER — Inpatient Hospital Stay (HOSPITAL_COMMUNITY)
Admission: EM | Admit: 2023-09-01 | Discharge: 2023-09-03 | DRG: 391 | Disposition: A | Discharge status: Home / Self Care | Payer: 59 | Attending: Family Medicine | Admitting: Family Medicine

## 2023-09-01 DIAGNOSIS — Z79899 Other long term (current) drug therapy: Secondary | ICD-10-CM | POA: Diagnosis not present

## 2023-09-01 DIAGNOSIS — Z7951 Long term (current) use of inhaled steroids: Secondary | ICD-10-CM | POA: Diagnosis not present

## 2023-09-01 DIAGNOSIS — Z841 Family history of disorders of kidney and ureter: Secondary | ICD-10-CM | POA: Diagnosis not present

## 2023-09-01 DIAGNOSIS — A084 Viral intestinal infection, unspecified: Principal | ICD-10-CM | POA: Diagnosis present

## 2023-09-01 DIAGNOSIS — L930 Discoid lupus erythematosus: Secondary | ICD-10-CM | POA: Diagnosis not present

## 2023-09-01 DIAGNOSIS — I10 Essential (primary) hypertension: Secondary | ICD-10-CM | POA: Diagnosis not present

## 2023-09-01 DIAGNOSIS — D631 Anemia in chronic kidney disease: Secondary | ICD-10-CM | POA: Diagnosis not present

## 2023-09-01 DIAGNOSIS — R112 Nausea with vomiting, unspecified: Secondary | ICD-10-CM | POA: Diagnosis not present

## 2023-09-01 DIAGNOSIS — R109 Unspecified abdominal pain: Secondary | ICD-10-CM | POA: Diagnosis not present

## 2023-09-01 DIAGNOSIS — Z8249 Family history of ischemic heart disease and other diseases of the circulatory system: Secondary | ICD-10-CM

## 2023-09-01 DIAGNOSIS — M311 Thrombotic microangiopathy, unspecified: Secondary | ICD-10-CM | POA: Diagnosis not present

## 2023-09-01 DIAGNOSIS — Z8269 Family history of other diseases of the musculoskeletal system and connective tissue: Secondary | ICD-10-CM

## 2023-09-01 DIAGNOSIS — N186 End stage renal disease: Secondary | ICD-10-CM | POA: Diagnosis not present

## 2023-09-01 DIAGNOSIS — Z992 Dependence on renal dialysis: Secondary | ICD-10-CM | POA: Diagnosis not present

## 2023-09-01 DIAGNOSIS — Z833 Family history of diabetes mellitus: Secondary | ICD-10-CM

## 2023-09-01 DIAGNOSIS — M898X9 Other specified disorders of bone, unspecified site: Secondary | ICD-10-CM | POA: Diagnosis present

## 2023-09-01 DIAGNOSIS — I158 Other secondary hypertension: Secondary | ICD-10-CM | POA: Diagnosis not present

## 2023-09-01 DIAGNOSIS — Z885 Allergy status to narcotic agent status: Secondary | ICD-10-CM | POA: Diagnosis not present

## 2023-09-01 DIAGNOSIS — I517 Cardiomegaly: Secondary | ICD-10-CM | POA: Diagnosis not present

## 2023-09-01 DIAGNOSIS — N2581 Secondary hyperparathyroidism of renal origin: Secondary | ICD-10-CM | POA: Diagnosis present

## 2023-09-01 DIAGNOSIS — F17290 Nicotine dependence, other tobacco product, uncomplicated: Secondary | ICD-10-CM | POA: Diagnosis not present

## 2023-09-01 DIAGNOSIS — K219 Gastro-esophageal reflux disease without esophagitis: Secondary | ICD-10-CM | POA: Diagnosis present

## 2023-09-01 DIAGNOSIS — R531 Weakness: Secondary | ICD-10-CM | POA: Diagnosis not present

## 2023-09-01 DIAGNOSIS — M351 Other overlap syndromes: Secondary | ICD-10-CM | POA: Diagnosis not present

## 2023-09-01 DIAGNOSIS — R197 Diarrhea, unspecified: Principal | ICD-10-CM | POA: Diagnosis present

## 2023-09-01 DIAGNOSIS — Z888 Allergy status to other drugs, medicaments and biological substances status: Secondary | ICD-10-CM

## 2023-09-01 DIAGNOSIS — Z227 Latent tuberculosis: Secondary | ICD-10-CM

## 2023-09-01 DIAGNOSIS — I120 Hypertensive chronic kidney disease with stage 5 chronic kidney disease or end stage renal disease: Secondary | ICD-10-CM | POA: Diagnosis not present

## 2023-09-01 LAB — COMPREHENSIVE METABOLIC PANEL
ALT: 14 U/L (ref 0–44)
AST: 18 U/L (ref 15–41)
Albumin: 3.5 g/dL (ref 3.5–5.0)
Alkaline Phosphatase: 79 U/L (ref 38–126)
Anion gap: 19 — ABNORMAL HIGH (ref 5–15)
BUN: 60 mg/dL — ABNORMAL HIGH (ref 6–20)
CO2: 20 mmol/L — ABNORMAL LOW (ref 22–32)
Calcium: 7.8 mg/dL — ABNORMAL LOW (ref 8.9–10.3)
Chloride: 100 mmol/L (ref 98–111)
Creatinine, Ser: 11.33 mg/dL — ABNORMAL HIGH (ref 0.44–1.00)
GFR, Estimated: 4 mL/min — ABNORMAL LOW (ref >60)
Glucose, Bld: 80 mg/dL (ref 70–99)
Potassium: 3.8 mmol/L (ref 3.5–5.1)
Sodium: 139 mmol/L (ref 135–145)
Total Bilirubin: 0.7 mg/dL (ref 0.0–1.2)
Total Protein: 8 g/dL (ref 6.5–8.1)

## 2023-09-01 LAB — URINALYSIS, ROUTINE W REFLEX MICROSCOPIC
Bilirubin Urine: NEGATIVE
Glucose, UA: NEGATIVE mg/dL
Hgb urine dipstick: NEGATIVE
Ketones, ur: 5 mg/dL — AB
Nitrite: NEGATIVE
Protein, ur: >=300 mg/dL — AB
Specific Gravity, Urine: 1.012 (ref 1.005–1.030)
pH: 7 (ref 5.0–8.0)

## 2023-09-01 LAB — LACTIC ACID, PLASMA: Lactic Acid, Venous: 1.2 mmol/L (ref 0.5–1.9)

## 2023-09-01 LAB — CBC
HCT: 32.8 % — ABNORMAL LOW (ref 36.0–46.0)
Hemoglobin: 10.5 g/dL — ABNORMAL LOW (ref 12.0–15.0)
MCH: 30.8 pg (ref 26.0–34.0)
MCHC: 32 g/dL (ref 30.0–36.0)
MCV: 96.2 fL (ref 80.0–100.0)
Platelets: 215 10*3/uL (ref 150–400)
RBC: 3.41 MIL/uL — ABNORMAL LOW (ref 3.87–5.11)
RDW: 17.1 % — ABNORMAL HIGH (ref 11.5–15.5)
WBC: 8.8 10*3/uL (ref 4.0–10.5)
nRBC: 0 % (ref 0.0–0.2)

## 2023-09-01 LAB — LIPASE, BLOOD: Lipase: 36 U/L (ref 11–51)

## 2023-09-01 LAB — TROPONIN I (HIGH SENSITIVITY)
Troponin I (High Sensitivity): 30 ng/L — ABNORMAL HIGH (ref <18)
Troponin I (High Sensitivity): 26 ng/L — ABNORMAL HIGH (ref <18)

## 2023-09-01 LAB — HCG, SERUM, QUALITATIVE: Preg, Serum: NEGATIVE

## 2023-09-01 MED ORDER — HYDROMORPHONE HCL 1 MG/ML IJ SOLN: 0.5000 mg | INTRAMUSCULAR | Status: DC | PRN

## 2023-09-01 MED ORDER — SODIUM CHLORIDE 0.9 % IV BOLUS
500.0000 mL | Freq: Once | INTRAVENOUS | Status: AC
  Administered 2023-09-01: 500 mL via INTRAVENOUS

## 2023-09-01 MED ORDER — CALCITRIOL 0.25 MCG PO CAPS
0.2500 ug | ORAL_CAPSULE | Freq: Every day | ORAL | Status: DC
  Administered 2023-09-02 – 2023-09-03 (×2): 0.25 ug via ORAL
  Filled 2023-09-01 (×2): qty 1

## 2023-09-01 MED ORDER — PANTOPRAZOLE SODIUM 40 MG PO TBEC: 40.0000 mg | DELAYED_RELEASE_TABLET | Freq: Every day | ORAL | Status: DC | PRN

## 2023-09-01 MED ORDER — AMLODIPINE BESYLATE 10 MG PO TABS
10.0000 mg | ORAL_TABLET | Freq: Every day | ORAL | Status: DC
  Administered 2023-09-02 – 2023-09-03 (×2): 10 mg via ORAL
  Filled 2023-09-01 (×2): qty 1

## 2023-09-01 MED ORDER — ONDANSETRON HCL 4 MG/2ML IJ SOLN
4.0000 mg | Freq: Four times a day (QID) | INTRAMUSCULAR | Status: DC | PRN
  Administered 2023-09-02: 4 mg via INTRAVENOUS
  Filled 2023-09-01: qty 2

## 2023-09-01 MED ORDER — MORPHINE SULFATE (PF) 2 MG/ML IV SOLN
2.0000 mg | Freq: Once | INTRAVENOUS | Status: AC
  Administered 2023-09-01: 2 mg via INTRAVENOUS
  Filled 2023-09-01: qty 1

## 2023-09-01 MED ORDER — HYDRALAZINE HCL 20 MG/ML IJ SOLN
10.0000 mg | Freq: Once | INTRAMUSCULAR | Status: AC
  Administered 2023-09-01: 10 mg via INTRAVENOUS
  Filled 2023-09-01: qty 1

## 2023-09-01 MED ORDER — LIDOCAINE-PRILOCAINE 2.5-2.5 % EX CREA
1.0000 | TOPICAL_CREAM | CUTANEOUS | Status: DC
  Filled 2023-09-01: qty 5

## 2023-09-01 MED ORDER — HYDROMORPHONE HCL 1 MG/ML IJ SOLN
0.5000 mg | Freq: Once | INTRAMUSCULAR | Status: AC
  Administered 2023-09-01: 0.5 mg via INTRAVENOUS
  Filled 2023-09-01: qty 1

## 2023-09-01 MED ORDER — LOSARTAN POTASSIUM 50 MG PO TABS
50.0000 mg | ORAL_TABLET | Freq: Every day | ORAL | Status: DC
  Administered 2023-09-02 – 2023-09-03 (×2): 50 mg via ORAL
  Filled 2023-09-01 (×2): qty 1

## 2023-09-01 MED ORDER — SODIUM BICARBONATE 650 MG PO TABS
650.0000 mg | ORAL_TABLET | Freq: Two times a day (BID) | ORAL | Status: DC
  Administered 2023-09-01 – 2023-09-03 (×4): 650 mg via ORAL
  Filled 2023-09-01 (×4): qty 1

## 2023-09-01 MED ORDER — CARVEDILOL 25 MG PO TABS
25.0000 mg | ORAL_TABLET | Freq: Two times a day (BID) | ORAL | Status: DC
  Administered 2023-09-02 – 2023-09-03 (×3): 25 mg via ORAL
  Filled 2023-09-01 (×3): qty 1

## 2023-09-01 MED ORDER — ACETAMINOPHEN 325 MG PO TABS
650.0000 mg | ORAL_TABLET | Freq: Four times a day (QID) | ORAL | Status: DC | PRN
  Administered 2023-09-01: 650 mg via ORAL
  Filled 2023-09-01: qty 2

## 2023-09-01 MED ORDER — ACETAMINOPHEN 650 MG RE SUPP: 650.0000 mg | Freq: Four times a day (QID) | RECTAL | Status: DC | PRN

## 2023-09-01 MED ORDER — SEVELAMER CARBONATE 2.4 G PO PACK
7.2000 g | PACK | Freq: Three times a day (TID) | ORAL | Status: DC
  Administered 2023-09-02: 7.2 g via ORAL
  Filled 2023-09-01 (×4): qty 3

## 2023-09-01 MED ORDER — ONDANSETRON HCL 4 MG/2ML IJ SOLN
4.0000 mg | Freq: Once | INTRAMUSCULAR | Status: AC
  Administered 2023-09-01: 4 mg via INTRAVENOUS
  Filled 2023-09-01: qty 2

## 2023-09-01 MED ORDER — HEPARIN SODIUM (PORCINE) 5000 UNIT/ML IJ SOLN
5000.0000 [IU] | Freq: Three times a day (TID) | INTRAMUSCULAR | Status: DC
  Administered 2023-09-01 – 2023-09-03 (×5): 5000 [IU] via SUBCUTANEOUS
  Filled 2023-09-01 (×5): qty 1

## 2023-09-01 MED ORDER — HYDROMORPHONE HCL 1 MG/ML IJ SOLN
0.5000 mg | INTRAMUSCULAR | Status: DC | PRN
  Administered 2023-09-01 – 2023-09-03 (×9): 1 mg via INTRAVENOUS
  Filled 2023-09-01 (×9): qty 1

## 2023-09-01 NOTE — Assessment & Plan Note (Addendum)
 Suspected to have viral gastroenteritis most likely.  (Also, known nationwide outbreak of norovirus reported in news this past week). Clear liquid diet as tolerated PRN zofran  IVF: got 500cc bolus in ED BP on high side of normal, will wait for lactic acid level to come back before giving any more for the moment Holding lasix  Check lactate PRN dilaudid  for pain GI pathogen pnl pending

## 2023-09-01 NOTE — ED Notes (Signed)
 Pt not in room.

## 2023-09-01 NOTE — Assessment & Plan Note (Signed)
Cont home BP meds ?

## 2023-09-01 NOTE — Assessment & Plan Note (Addendum)
 PMP[place] reviewed: pt on chronic Oxy IR 15mg  with most recent script filled 07/31/24 #150 tabs / 30 days. Due to N/V/D: using Dilaudid IV PRN instead for pain for the moment. Cont home phosphate binders

## 2023-09-01 NOTE — ED Notes (Signed)
 X-ray at bedside

## 2023-09-01 NOTE — ED Triage Notes (Signed)
 Patient with three days of intermittent burning abdominal pain and burning chest pain. States has LUE numbness in association with chest pain. Generalized abdominal pain with n/v/d. Last dialysis tx yesterday.

## 2023-09-01 NOTE — ED Notes (Signed)
 Patient is resting; pain improved; patient tolerated PO fluids.

## 2023-09-01 NOTE — Assessment & Plan Note (Signed)
 Pt with reported full dialysis session yesterday. Nephrology notified of pt admission by EDP.

## 2023-09-01 NOTE — Assessment & Plan Note (Signed)
 Home med rec pending Though at first glance, dont see any DMARDs listed.

## 2023-09-01 NOTE — ED Provider Notes (Signed)
 [place] EMERGENCY DEPARTMENT AT [facility] Provider Note   CSN: [id number] Arrival date & time: 09/01/23  1321     History  Chief Complaint  Patient presents with   Abdominal Pain   Diarrhea   Emesis    Rebeccah Ivins Sarchet is a 28 y.o. female.  28 year old female with prior medical history as detailed below presents for evaluation.  Patient complains of worsening nausea, vomiting, diarrhea x 3 to 4 days.  Patient is on dialysis.  Her last dialysis session was yesterday.  She reports that this was completed without difficulty.  She is next due for dialysis on Friday.  She reports significant increased symptoms today with worsening nausea, loose watery stool, and diffuse abdominal cramping.  The history is provided by the patient and medical records.       Home Medications Prior to Admission medications   Medication Sig Start Date End Date Taking? Authorizing Provider  amLODipine  (NORVASC ) 10 MG tablet Take 10 mg by mouth daily. 08/05/21   [provider]  buprenorphine (BUTRANS) 5 MCG/HR PTWK 1 PATCH ON SKIN WEEKLY Patient not taking: Reported on 05/24/2023    [provider]  calcitRIOL  (ROCALTROL ) 0.25 MCG capsule Take 1 tablet by mouth daily.    [provider]  carvedilol  (COREG ) 12.5 MG tablet Take 1 tablet (12.5 mg total) by mouth 2 (two) times daily with a meal. 10/02/21 05/18/22  Dickie Begun, MD  Cinacalcet  HCl (SENSIPAR  PO) Take by mouth. Patient not taking: Reported on 05/24/2023 09/25/22 09/24/23  [provider]  doxycycline  (ADOXA) 100 MG tablet Take 1 tablet (100 mg total) by mouth daily. Take with food 05/24/23   Alm Delon SAILOR, DO  furosemide  (LASIX ) 40 MG tablet Take 3 tablets (120 mg total) by mouth 2 (two) times daily. 10/02/21 05/18/22  Dickie Begun, MD  gabapentin (NEURONTIN) 100 MG capsule Take 1 capsule by mouth 2 (two) times daily. Patient not taking: Reported on 05/24/2023    [provider]  hydrALAZINE   (APRESOLINE ) 100 MG tablet Take 1 tablet (100 mg total) by mouth 3 (three) times daily. 10/02/21 11/01/21  Dickie Begun, MD  lidocaine  (XYLOCAINE ) 5 % ointment Apply topically. 11/25/20   [provider]  lidocaine -prilocaine  (EMLA ) cream Apply topically. Patient not taking: Reported on 05/24/2023 11/17/21   [provider]  losartan  (COZAAR ) 50 MG tablet Take 1 tablet (50 mg total) by mouth daily. 10/02/21 05/18/22  Dickie Begun, MD  medroxyPROGESTERone  (DEPO-PROVERA ) 150 MG/ML injection Inject 1 mL (150 mg total) into the muscle every 3 (three) months. Patient not taking: Reported on 05/24/2023 10/29/22   Rudy Carlin LABOR, MD  Methoxy PEG-Epoetin  Beta (MIRCERA IJ) Mircera 09/28/22 09/27/23  [provider]  naloxone [facility]) nasal spray 4 mg/0.1 mL SPRAY AS NEEDED    [provider]  oxyCODONE  (ROXICODONE ) 15 MG immediate release tablet Take 15 mg by mouth 5 (five) times daily as needed.    [provider]  Oxycodone  HCl 10 MG TABS TAKE 1 TABLET BY MOUTH FIVE TIMES DAILY AS NEEDED    [provider]  oxyCODONE -acetaminophen  (PERCOCET) 10-325 MG tablet TAKE 1 TABLET BY MOUTH FIVE TIMES DAILY AS NEEDED    [provider]  oxyCODONE -acetaminophen  (PERCOCET/ROXICET) 5-325 MG tablet TAKE 1 TABLET FIVE TIMES DAILY AS NEEDED    [provider]  pantoprazole  (PROTONIX ) 40 MG tablet Take 1 tablet (40 mg total) by mouth 2 (two) times daily. Patient not taking: Reported on 09/02/2021 01/28/18 09/02/21  Ezenduka,  Lebron PARAS, MD  RENVELA  2.4 g PACK Take by mouth. 12/03/22   [provider]  RETIN-A  0.05 % cream Apply pea size amount to face nightly three times a week. 06/29/23   Alm Delon SAILOR, DO  rifampin  (RIFADIN ) 300 MG capsule Take 300 mg by mouth 2 (two) times daily. 08/22/21   [provider]  Safety Seal Miscellaneous MISC Use on the alternate nights when you are not applying tretinoin  (Tuesday, Thursday, Saturday, and Sunday)  05/24/23   Alm Delon SAILOR, DO  sodium bicarbonate  650 MG tablet Take 1 tablet (650 mg total) by mouth 2 (two) times daily. 09/05/21   Singh, Prashant K, MD  SYMBICORT 160-4.5 MCG/ACT inhaler Inhale 1 puff into the lungs daily as needed (For shortness of breath). 07/29/21   [provider]  traZODone  (DESYREL ) 50 MG tablet Take 1 tablet by mouth at bedtime.    [provider]  triamcinolone cream (KENALOG) 0.1 % PLEASE SEE ATTACHED FOR DETAILED DIRECTIONS    [provider]  Vitamin D, Ergocalciferol, (DRISDOL) 1.25 MG (50000 UNIT) CAPS capsule ergocalciferol (vitamin D2) 1,250 mcg (50,000 unit) capsule TAKE 1 CAPSULE BY MOUTH WEEKLY 03/11/21   [provider]      Allergies    Lisinopril , Gabapentin, Ativan  [lorazepam ], Hydroxychloroquine , and Vicodin [hydrocodone -acetaminophen ]    Review of Systems   Review of Systems  All other systems reviewed and are negative.   Physical Exam Updated Vital Signs BP (!) 173/123 (BP Location: Right Arm)   Pulse 67   Temp 98.4 F (36.9 C) (Oral)   Resp 18   LMP  (Approximate)   SpO2 100%  Physical Exam Vitals and nursing note reviewed.  Constitutional:      General: She is not in acute distress.    Appearance: Normal appearance. She is well-developed.  HENT:     Head: Normocephalic and atraumatic.  Eyes:     Conjunctiva/sclera: Conjunctivae normal.     Pupils: Pupils are equal, round, and reactive to light.  Cardiovascular:     Rate and Rhythm: Normal rate and regular rhythm.     Heart sounds: Normal heart sounds.  Pulmonary:     Effort: Pulmonary effort is normal. No respiratory distress.     Breath sounds: Normal breath sounds.  Abdominal:     General: There is no distension.     Palpations: Abdomen is soft.     Tenderness: There is no abdominal tenderness.  Musculoskeletal:        General: No deformity. Normal range of motion.     Cervical back: Normal range of motion and neck supple.  Skin:     General: Skin is warm and dry.  Neurological:     General: No focal deficit present.     Mental Status: She is alert and oriented to person, place, and time.     ED Results / Procedures / Treatments   Labs (all labs ordered are listed, but only abnormal results are displayed) Labs Reviewed  COMPREHENSIVE METABOLIC PANEL - Abnormal; Notable for the following components:      Result Value   CO2 20 (*)    BUN 60 (*)    Creatinine, Ser 11.33 (*)    Calcium  7.8 (*)    GFR, Estimated 4 (*)    Anion gap 19 (*)    All other components within normal limits  CBC - Abnormal; Notable for the following components:   RBC 3.41 (*)    Hemoglobin 10.5 (*)  HCT 32.8 (*)    RDW 17.1 (*)    All other components within normal limits  URINALYSIS, ROUTINE W REFLEX MICROSCOPIC - Abnormal; Notable for the following components:   APPearance CLOUDY (*)    Ketones, ur 5 (*)    Protein, ur >=300 (*)    Leukocytes,Ua TRACE (*)    Bacteria, UA RARE (*)    All other components within normal limits  TROPONIN I (HIGH SENSITIVITY) - Abnormal; Notable for the following components:   Troponin I (High Sensitivity) 30 (*)    All other components within normal limits  LIPASE, BLOOD  HCG, SERUM, QUALITATIVE  TROPONIN I (HIGH SENSITIVITY)    EKG None  Radiology No results found.  Procedures Procedures    Medications Ordered in ED Medications - No data to display  ED Course/ Medical Decision Making/ A&P                                 Medical Decision Making Amount and/or Complexity of Data Reviewed Labs: ordered. Radiology: ordered.  Risk Prescription drug management. Decision regarding hospitalization.    Medical Screen Complete  This patient presented to the ED with complaint of n/v/d.  This complaint involves an extensive number of treatment options. The initial differential diagnosis includes, but is not limited to, metabolic abnormality, esrd, etc  This presentation is: Acute,  Chronic, Self-Limited, Previously Undiagnosed, Uncertain Prognosis, Complicated, Systemic Symptoms, and Threat to Life/Bodily Function  Patient with n/v/d. Unable to keep PO fluids down today.  Suspect possible viral infection and concurrent dehydration.  Patient would benefit from admission. Dr. Tobie (nephrology) aware of case.   Hospitalist service is aware of case.    Additional history obtained: External records from outside sources obtained and reviewed including prior ED visits and prior Inpatient records.    Lab Tests:  I ordered and personally interpreted labs.  The pertinent results include:  cbc cmp ua trop hcg lactic acid   Imaging Studies ordered:  I ordered imaging studies including ct ap, cxr  I independently visualized and interpreted obtained imaging which showed nad I agree with the radiologist interpretation.   Cardiac Monitoring:  The patient was maintained on a cardiac monitor.  I personally viewed and interpreted the cardiac monitor which showed an underlying rhythm of: nsr Problem List / ED Course:  N/V/D   Reevaluation:  After the interventions noted above, I reevaluated the patient and found that they have: improved   Disposition:  After consideration of the diagnostic results and the patients response to treatment, I feel that the patent would benefit from admission.          Final Clinical Impression(s) / ED Diagnoses Final diagnoses:  Nausea vomiting and diarrhea  ESRD (end stage renal disease) (HCC)    Rx / DC Orders ED Discharge Orders     None         Laurice Maude BROCKS, MD 09/02/23 0126

## 2023-09-02 ENCOUNTER — Encounter (HOSPITAL_COMMUNITY): Payer: Self-pay | Admitting: Internal Medicine

## 2023-09-02 DIAGNOSIS — Z8249 Family history of ischemic heart disease and other diseases of the circulatory system: Secondary | ICD-10-CM | POA: Diagnosis not present

## 2023-09-02 DIAGNOSIS — M898X9 Other specified disorders of bone, unspecified site: Secondary | ICD-10-CM | POA: Diagnosis present

## 2023-09-02 DIAGNOSIS — N186 End stage renal disease: Secondary | ICD-10-CM | POA: Diagnosis present

## 2023-09-02 DIAGNOSIS — R197 Diarrhea, unspecified: Secondary | ICD-10-CM

## 2023-09-02 DIAGNOSIS — Z841 Family history of disorders of kidney and ureter: Secondary | ICD-10-CM | POA: Diagnosis not present

## 2023-09-02 DIAGNOSIS — A084 Viral intestinal infection, unspecified: Secondary | ICD-10-CM | POA: Diagnosis present

## 2023-09-02 DIAGNOSIS — Z79899 Other long term (current) drug therapy: Secondary | ICD-10-CM | POA: Diagnosis not present

## 2023-09-02 DIAGNOSIS — Z7951 Long term (current) use of inhaled steroids: Secondary | ICD-10-CM | POA: Diagnosis not present

## 2023-09-02 DIAGNOSIS — I10 Essential (primary) hypertension: Secondary | ICD-10-CM | POA: Diagnosis not present

## 2023-09-02 DIAGNOSIS — R112 Nausea with vomiting, unspecified: Secondary | ICD-10-CM | POA: Diagnosis not present

## 2023-09-02 DIAGNOSIS — L930 Discoid lupus erythematosus: Secondary | ICD-10-CM | POA: Diagnosis present

## 2023-09-02 DIAGNOSIS — I120 Hypertensive chronic kidney disease with stage 5 chronic kidney disease or end stage renal disease: Secondary | ICD-10-CM | POA: Diagnosis not present

## 2023-09-02 DIAGNOSIS — F17290 Nicotine dependence, other tobacco product, uncomplicated: Secondary | ICD-10-CM | POA: Diagnosis present

## 2023-09-02 DIAGNOSIS — Z888 Allergy status to other drugs, medicaments and biological substances status: Secondary | ICD-10-CM | POA: Diagnosis not present

## 2023-09-02 DIAGNOSIS — K219 Gastro-esophageal reflux disease without esophagitis: Secondary | ICD-10-CM | POA: Diagnosis present

## 2023-09-02 DIAGNOSIS — Z833 Family history of diabetes mellitus: Secondary | ICD-10-CM | POA: Diagnosis not present

## 2023-09-02 DIAGNOSIS — M311 Thrombotic microangiopathy, unspecified: Secondary | ICD-10-CM | POA: Diagnosis present

## 2023-09-02 DIAGNOSIS — R109 Unspecified abdominal pain: Secondary | ICD-10-CM | POA: Diagnosis not present

## 2023-09-02 DIAGNOSIS — Z227 Latent tuberculosis: Secondary | ICD-10-CM | POA: Diagnosis not present

## 2023-09-02 DIAGNOSIS — D631 Anemia in chronic kidney disease: Secondary | ICD-10-CM | POA: Diagnosis present

## 2023-09-02 DIAGNOSIS — N250 Renal osteodystrophy: Secondary | ICD-10-CM | POA: Diagnosis not present

## 2023-09-02 DIAGNOSIS — M351 Other overlap syndromes: Secondary | ICD-10-CM | POA: Diagnosis not present

## 2023-09-02 DIAGNOSIS — N2581 Secondary hyperparathyroidism of renal origin: Secondary | ICD-10-CM | POA: Diagnosis present

## 2023-09-02 DIAGNOSIS — I158 Other secondary hypertension: Secondary | ICD-10-CM | POA: Diagnosis present

## 2023-09-02 DIAGNOSIS — Z992 Dependence on renal dialysis: Secondary | ICD-10-CM | POA: Diagnosis not present

## 2023-09-02 DIAGNOSIS — Z885 Allergy status to narcotic agent status: Secondary | ICD-10-CM | POA: Diagnosis not present

## 2023-09-02 DIAGNOSIS — Z8269 Family history of other diseases of the musculoskeletal system and connective tissue: Secondary | ICD-10-CM | POA: Diagnosis not present

## 2023-09-02 LAB — BASIC METABOLIC PANEL
Anion gap: 18 — ABNORMAL HIGH (ref 5–15)
BUN: 60 mg/dL — ABNORMAL HIGH (ref 6–20)
CO2: 21 mmol/L — ABNORMAL LOW (ref 22–32)
Calcium: 7.7 mg/dL — ABNORMAL LOW (ref 8.9–10.3)
Chloride: 99 mmol/L (ref 98–111)
Creatinine, Ser: 11.29 mg/dL — ABNORMAL HIGH (ref 0.44–1.00)
GFR, Estimated: 4 mL/min — ABNORMAL LOW (ref >60)
Glucose, Bld: 81 mg/dL (ref 70–99)
Potassium: 3.4 mmol/L — ABNORMAL LOW (ref 3.5–5.1)
Sodium: 138 mmol/L (ref 135–145)

## 2023-09-02 LAB — CBC
HCT: 29.5 % — ABNORMAL LOW (ref 36.0–46.0)
Hemoglobin: 9.6 g/dL — ABNORMAL LOW (ref 12.0–15.0)
MCH: 30.8 pg (ref 26.0–34.0)
MCHC: 32.5 g/dL (ref 30.0–36.0)
MCV: 94.6 fL (ref 80.0–100.0)
Platelets: 199 10*3/uL (ref 150–400)
RBC: 3.12 MIL/uL — ABNORMAL LOW (ref 3.87–5.11)
RDW: 17.2 % — ABNORMAL HIGH (ref 11.5–15.5)
WBC: 9 10*3/uL (ref 4.0–10.5)
nRBC: 0 % (ref 0.0–0.2)

## 2023-09-02 LAB — LACTIC ACID, PLASMA: Lactic Acid, Venous: 1.4 mmol/L (ref 0.5–1.9)

## 2023-09-02 LAB — HEPATITIS B SURFACE ANTIGEN: Hepatitis B Surface Ag: NONREACTIVE

## 2023-09-02 LAB — HIV ANTIBODY (ROUTINE TESTING W REFLEX): HIV Screen 4th Generation wRfx: NONREACTIVE

## 2023-09-02 LAB — MRSA NEXT GEN BY PCR, NASAL: MRSA by PCR Next Gen: NOT DETECTED

## 2023-09-02 MED ORDER — DOXERCALCIFEROL 4 MCG/2ML IV SOLN
9.0000 ug | INTRAVENOUS | Status: DC
  Filled 2023-09-02: qty 6

## 2023-09-02 MED ORDER — SEVELAMER CARBONATE 2.4 G PO PACK
2.4000 g | PACK | Freq: Three times a day (TID) | ORAL | Status: DC
  Administered 2023-09-02 – 2023-09-03 (×2): 2.4 g via ORAL
  Filled 2023-09-02 (×3): qty 1

## 2023-09-02 MED ORDER — CHLORHEXIDINE GLUCONATE CLOTH 2 % EX PADS
6.0000 | MEDICATED_PAD | Freq: Every day | CUTANEOUS | Status: DC
Start: 2023-09-02 — End: 2023-09-03
  Administered 2023-09-02 – 2023-09-03 (×2): 6 via TOPICAL

## 2023-09-02 NOTE — Progress Notes (Signed)
 NEW ADMISSION NOTE   Arrival Method: ED stretcher Mental Orientation: AAOx4 Telemetry: NA Assessment: Completed Skin: See flowsheet IV: R Hand Pain: 7/10 Tubes: n/a Safety Measures: Safety Fall Prevention Plan has been given, discussed and signed Admission: Completed [facility] Orientation: Patient has been orientated to the room, unit and staff.  Family: none at bedside   Orders have been reviewed and implemented. Will continue to monitor the patient. Call light has been placed within reach and bed alarm has been activated.

## 2023-09-02 NOTE — Progress Notes (Signed)
 Patient seen examined and evaluated after midnight  28 year old known SLE since age 48 or 30 with 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 Delon Lenis with Retin-A  other meds HTN Latent TB on rifampin  Calciphylaxis with known nodules knees elbows buttocks previously treated with doxycycline  ESRD MWF via L brachial AVF since 08/2021-anemia renal disease  Admit from ED Intermittent burning abdominal chest pain LEU numbness additionally worsening nausea and loose watery stool diffuse abdominal cramping  BUN/creatinine 60/11.3 troponin 30 with gray zone in the 30s WBC 8.8 hemoglobin 10.5 platelet 215--- UA >300 protein CT ABD no acute abdominopelvic process.  Patient looks fair she is concerned about some of the CT imaging findings with regards to calcium  but otherwise seems okay Her last episode of diarrhea was yesterday She does not really feel nauseous now and is able to eat some she has had no recent changes to meds nor she had any antibiotics etc.  O/E BP (!) 146/99 (BP Location: Right Arm)   Pulse 71   Temp 97.7 F (36.5 C) (Oral)   Resp 18   LMP  (Approximate)   SpO2 100%  EOMI NCAT no focal deficit no icterus no pallor Scarring to face Chest clear no added sound no wheeze rales rhonchi Abdomen a little uncomfortable on pressure and the umbilical region No rebound no guarding cannot appreciate HSM No lower extremity edema   P Seems like an acute gastroenteritis with no inciting cause or any further diarrhea Hopefully this is self resolved--- if no further stool discontinue GI pathogen panel Nephrology to see patient to set up HD Patient stable and if continues to do well likely can discharge home tomorrow to continue outpatient management  Reggie Grimes, MD [facility] 9:07 AM

## 2023-09-02 NOTE — Consult Note (Signed)
 [facility] Renal Consultation Note    Indication for Consultation:  Management of ESRD/hemodialysis, anemia, hypertension/volume, and secondary hyperparathyroidism.  HPI: Linda [name] is a 28 y.o. female with PMH including ESRD on dialysis, HTN, lupus, who presented to the ED with abdominal pain, nausea and diarrhea for 3-4 days. Suspected to have gastroenteritis. She was given 500ml of NS in the ED and PRN zofran . CT abdomen without acute findings. She reports she is feeling better today, was able to eat some crackers and has not had any vomiting or diarrhea. She denies fever, chills, SOB, CP, palpitations, dizziness, edema. Reports good UOP and denies dysuria. Nephrology was consulted for management of ESRD during admission. Patient tends to miss dialysis often. Last HD was on 12/31 for 2 hours, did meet EDW. Prior to this, her last dialysis was 08/18/23. She reports she was having an issue with another patient and her HD center is aware. Apparently she was also at [facility] at some point during this period. Labs today notable for K+ 3.4, BUN 60, Cr 11.29, Ca 7.7, WBC 9.0, Hgb 9.6, Plt 199. CXR on admission without any pulmonary edema.    Past Medical History:  Diagnosis Date   Abnormal ECG    a. In setting of LVH w/ repolarization abnormalities; b. 03/2019 MV: No ischemia/infarct. EF 55-65%.   Angioedema    ESRD (end stage renal disease) (HCC)    a. prev on HD.   GERD (gastroesophageal reflux disease)    Hypertension    Lupus    LVH (left ventricular hypertrophy)    a. 11/2017 Echo: EF 50-55%. triv AI. Mild MR. Mod L and mild R atrial enlargement. Nl RV size/fxn. Small pericardial effusion w/o tampondae; b. 03/2018 Echo: EF 55-60%, restrictive filling pattern. Nl RV size/fxn. Sev LAE. Mild MR, mild to mod TR/PR. Mod PAH. Triv effusion.   Migraines    Mixed connective tissue disease (HCC)    Previous Dialysis patient [facility])    a. Off HD since late 2020.    Septic prepatellar bursitis of right knee 08/19/2017   Past Surgical History:  Procedure Laterality Date   GRAFT APPLICATION Left 03/30/2019   Procedure: BRACHIAL CEPHALIC GRAFT CREATION;  Surgeon: Marea Selinda RAMAN, MD;  Location: [facility] ORS;  Service: Vascular;  Laterality: Left;   I & D EXTREMITY Right 08/06/2017   Procedure: IRRIGATION AND DEBRIDEMENT KNEE;  Surgeon: Kendal Franky SQUIBB, MD;  Location: [facility] OR;  Service: Orthopedics;  Laterality: Right;   IR THROMBECTOMY AV FISTULA W/THROMBOLYSIS/PTA/STENT INC/SHUNT/IMG LT Left 05/18/2022   IR US  GUIDE VASC ACCESS LEFT  05/18/2022   MULTIPLE TOOTH EXTRACTIONS     Family History  Problem Relation Age of Onset   Lupus Mother    Hypertension Mother    Kidney disease Mother    Heart Problems Mother    Coronary artery disease Mother 108       stent   Diabetes Maternal Grandmother    Social History:  reports that she has been smoking e-cigarettes. She started smoking about 8 years ago. She has a 2.1 pack-year smoking history. She has never used smokeless tobacco. She reports that she does not drink alcohol and does not use drugs.  ROS: As per HPI otherwise negative.   Physical Exam: Vitals:   09/01/23 2200 09/01/23 2338 09/02/23 0539 09/02/23 0809  BP: (!) 148/113 (!) 171/121 (!) 173/122 (!) 146/99  Pulse: 73 67 70 71  Resp: 15 19 18 18   Temp:  98.2 F (  36.8 C) 98.4 F (36.9 C) 97.7 F (36.5 C)  TempSrc:  Oral Oral Oral  SpO2: 100% 100% 100%      General: Alert female in NAD Head: Normocephalic, atraumatic, sclera non-icteric, mucus membranes are moist. Lungs: Clear bilaterally to auscultation without wheezes, rales, or rhonchi. Breathing is unlabored. Heart: RRR with normal S1, S2. No murmurs, rubs, or gallops appreciated. Abdomen: Soft, non-tender, non-distended with normoactive bowel sounds. No rebound/guarding. No obvious abdominal masses. Musculoskeletal:  Strength and tone appear normal for age. Lower extremities: No edema or  ischemic changes, no open wounds. Neuro: Alert and oriented X 3. Moves all extremities spontaneously. Psych:  Responds to questions appropriately with a normal affect. Dialysis Access: AVG + T/b  Allergies  Allergen Reactions   Lisinopril  Anaphylaxis    Angioedema   Gabapentin Other (See Comments)    Reaction: Tremor (intolerance); tremor   Ativan  [Lorazepam ] Other (See Comments)    Somnolent with 1mg  ativan  at [facility] 2019   Hydroxychloroquine  Other (See Comments)    Pt reports making her skin peel really bad   Vicodin [Hydrocodone -Acetaminophen ] Itching and Rash   Prior to Admission medications   Medication Sig Start Date End Date Taking? Authorizing Provider  amLODipine  (NORVASC ) 10 MG tablet Take 10 mg by mouth daily. 08/05/21  Yes [provider]  calcitRIOL  (ROCALTROL ) 0.25 MCG capsule Take 1 tablet by mouth daily.   Yes [provider]  carvedilol  (COREG ) 12.5 MG tablet Take 1 tablet (12.5 mg total) by mouth 2 (two) times daily with a meal. Patient taking differently: Take 25 mg by mouth 2 (two) times daily with a meal. 10/02/21 08/31/24 Yes Dickie Begun, MD  furosemide  (LASIX ) 80 MG tablet Take 80 mg by mouth 2 (two) times daily. 07/30/23  Yes [provider]  lidocaine -prilocaine  (EMLA ) cream Apply 1 Application topically every Monday, Wednesday, and Friday. 11/17/21  Yes [provider]  losartan  (COZAAR ) 50 MG tablet Take 1 tablet (50 mg total) by mouth daily. 10/02/21 08/31/24 Yes Dickie Begun, MD  medroxyPROGESTERone  (DEPO-PROVERA ) 150 MG/ML injection Inject 1 mL (150 mg total) into the muscle every 3 (three) months. 10/29/22  Yes Rudy Carlin LABOR, MD  Methoxy PEG-Epoetin  Beta (MIRCERA IJ) Mircera 09/28/22 09/27/23 Yes [provider]  naloxone (NARCAN) nasal spray 4 mg/0.1 mL SPRAY AS NEEDED   Yes [provider]  Ondansetron  HCl (ZOFRAN  PO) Take 4 mg by mouth daily as needed (for nausea). 10/02/22 10/01/23 Yes [provider]  oxyCODONE  (ROXICODONE ) 15 MG immediate release tablet Take 15 mg by mouth 5 (five) times daily as needed.   Yes [provider]  pantoprazole  (PROTONIX ) 40 MG tablet Take 1 tablet (40 mg total) by mouth 2 (two) times daily. Patient taking differently: Take 40 mg by mouth daily as needed (for acid reflux). 01/28/18 08/31/24 Yes Donnamarie Lebron PARAS, MD  RENVELA  2.4 g PACK Take 7.2 g by mouth 3 (three) times daily with meals. 12/03/22  Yes [provider]  sodium bicarbonate  650 MG tablet Take 1 tablet (650 mg total) by mouth 2 (two) times daily. 09/05/21  Yes Singh, Prashant K, MD  tiZANidine  (ZANAFLEX ) 4 MG tablet Take 4 mg by mouth daily as needed for muscle spasms (for sleep). 02/05/21  Yes [provider]  tretinoin  (RETIN-A ) 0.025 % cream Apply 1 Application topically 3 (three) times a week. 07/05/23  Yes [provider]  Vitamin D, Ergocalciferol, (DRISDOL) 1.25 MG (50000 UNIT) CAPS capsule ergocalciferol (vitamin D2) 1,250 mcg (  50,000 unit) capsule TAKE 1 CAPSULE BY MOUTH WEEKLY 03/11/21  Yes [provider]  Safety Seal Miscellaneous MISC Use on the alternate nights when you are not applying tretinoin  (Tuesday, Thursday, Saturday, and Sunday) 05/24/23   Alm Delon SAILOR, DO   Current Facility-Administered Medications  Medication Dose Route Frequency Provider Last Rate Last Admin   acetaminophen  (TYLENOL ) tablet 650 mg  650 mg Oral Q6H PRN Lonzell Emeline HERO, DO   650 mg at 09/01/23 2233   Or   acetaminophen  (TYLENOL ) suppository 650 mg  650 mg Rectal Q6H PRN Lonzell Emeline HERO, DO       amLODipine  (NORVASC ) tablet 10 mg  10 mg Oral Daily Lonzell Emeline M, DO   10 mg at 09/02/23 9386   calcitRIOL  (ROCALTROL ) capsule 0.25 mcg  0.25 mcg Oral Daily Lonzell Emeline M, DO   0.25 mcg at 09/02/23 9076   carvedilol  (COREG ) tablet 25 mg  25 mg Oral BID WC Lonzell Emeline HERO, DO   25 mg at 09/02/23 0809   heparin  injection 5,000 Units  5,000 Units  Subcutaneous Q8H Lonzell Emeline HERO, DO   5,000 Units at 09/02/23 9384   HYDROmorphone  (DILAUDID ) injection 0.5-1 mg  0.5-1 mg Intravenous Q2H PRN Lonzell Emeline HERO, DO   1 mg at 09/02/23 9253   lidocaine -prilocaine  (EMLA ) cream 1 Application  1 Application Topical Q M,W,F Lonzell Emeline M, DO       losartan  (COZAAR ) tablet 50 mg  50 mg Oral Daily Lonzell Emeline M, DO   50 mg at 09/02/23 9076   ondansetron  (ZOFRAN ) injection 4 mg  4 mg Intravenous Q6H PRN Lonzell Emeline HERO, DO   4 mg at 09/02/23 9947   pantoprazole  (PROTONIX ) EC tablet 40 mg  40 mg Oral Daily PRN Lonzell Emeline HERO, DO       sevelamer  carbonate (RENVELA ) powder PACK 7.2 g  7.2 g Oral TID WC Lonzell Emeline M, DO   7.2 g at 09/02/23 0809   sodium bicarbonate  tablet 650 mg  650 mg Oral BID Lonzell Emeline HERO, DO   650 mg at 09/02/23 9076   Labs: Basic Metabolic Panel: Recent Labs  Lab 09/01/23 1411 09/02/23 0054  NA 139 138  K 3.8 3.4*  CL 100 99  CO2 20* 21*  GLUCOSE 80 81  BUN 60* 60*  CREATININE 11.33* 11.29*  CALCIUM  7.8* 7.7*   Liver Function Tests: Recent Labs  Lab 09/01/23 1411  AST 18  ALT 14  ALKPHOS 79  BILITOT 0.7  PROT 8.0  ALBUMIN  3.5   Recent Labs  Lab 09/01/23 1411  LIPASE 36   No results for input(s): AMMONIA in the last 168 hours. CBC: Recent Labs  Lab 09/01/23 1411 09/02/23 0054  WBC 8.8 9.0  HGB 10.5* 9.6*  HCT 32.8* 29.5*  MCV 96.2 94.6  PLT 215 199   Cardiac Enzymes: No results for input(s): CKTOTAL, CKMB, CKMBINDEX, TROPONINI in the last 168 hours. CBG: No results for input(s): GLUCAP in the last 168 hours. Iron  Studies: No results for input(s): IRON , TIBC, TRANSFERRIN, FERRITIN in the last 72 hours. Studies/Results: CT ABDOMEN PELVIS WO CONTRAST Result Date: 09/01/2023 CLINICAL DATA:  Abdominal pain, acute, nonlocalized EXAM: CT ABDOMEN AND PELVIS WITHOUT CONTRAST TECHNIQUE: Multidetector CT imaging of the abdomen and pelvis was performed following the standard  protocol without IV contrast. RADIATION DOSE REDUCTION: This exam was performed according to the departmental dose-optimization program which includes automated exposure control, adjustment of the mA and/or kV according to  patient size and/or use of iterative reconstruction technique. COMPARISON:  CT AP, 12/23/2022 and 08/12/2022. FINDINGS: Lower chest: Cardiac enlargement.  No acute abnormality. Hepatobiliary: Normal noncontrast appearance of the liver without focal abnormality. No gallstones, gallbladder wall thickening, or biliary dilatation. Pancreas: No pancreatic ductal dilatation or surrounding inflammatory changes. Spleen: Normal in size without focal abnormality. Adrenals/Urinary Tract: Adrenal glands are unremarkable. Kidneys are normal, without renal calculi, focal lesion, or hydronephrosis. Bladder is unremarkable. Stomach/Bowel: Stomach is within normal limits. Appendix appears normal. No evidence of bowel wall thickening, distention, or inflammatory changes. Vascular/Lymphatic: No significant vascular findings are present. No enlarged abdominal or pelvic lymph nodes. Reproductive: Uterus and adnexa are unremarkable. Other: No abdominal wall hernia. Trace free fluid within the dependent pelvis, physiologic in a female of child-bearing age. Musculoskeletal: Subcutaneous calcification along the gluteal soft tissues, likely prior injection sites. BILATERAL L5 pars defects. No anterolisthesis. No acute osseous findings. IMPRESSION: 1. No acute abdominopelvic process. 2. Cardiomegaly. Additional incidental, chronic and senescent findings as above. Electronically Signed   By: Thom Hall M.D.   On: 09/01/2023 18:55   DG Chest Port 1 View Result Date: 09/01/2023 CLINICAL DATA:  ESRD on HD, weakness EXAM: PORTABLE CHEST 1 VIEW COMPARISON:  CT AP, concurrent.  Chest XR, 09/29/2021. FINDINGS: Enlarged cardiac silhouette. Lungs are well inflated. No focal consolidation or mass. No pleural effusion or  pneumothorax. Vascular graft of the LEFT upper extremity. No acute displaced fracture. IMPRESSION: Cardiomegaly without acute superimposed cardiopulmonary process Electronically Signed   By: Thom Hall M.D.   On: 09/01/2023 18:45    Dialysis Orders:  [facility] on MWF 3 hour 30 min BFR 250 DFR 500 EDW 73.9kg 2K 2Ca AVG 15g  No heparin  Mircera 150mcg IV q 2 weeks- last dose was 100mcg on 08/09/23 Hectorol  9mcg IV q HD Sensipar  150mg  PO q HD  Assessment/Plan:  Gastroenteritis: improving with supportive treatment, mgt per admitting team  ESRD:  Pt had 2 hours of HD on 12/31 and missed 5 treatments prior. Fortunately she seems to have some residual kidney function and good UOP, does not seem overly volume overloaded or uremic on exam. Next HD tomorrow per regular schedule. Encouraged to attend outpatient HD.   Hypertension/volume: BP moderately elevated. Continue home BP meds. UF with HD tomorrow as tolerated.   Anemia: Hgb 9.6, overdue for ESA. Will order with next HD  Metabolic bone disease: Calcium  on the low side, will change to 2.5Ca bat for now. Continue hectorol , sensipar , and phos binders.  Nutrition:  Advancing as tolerated per PMD  Lucie Collet, PA-C 09/02/2023, 10:01 AM  [facility] Pager: [phone]

## 2023-09-02 NOTE — H&P (Signed)
 History and Physical    Patient: Linda [name] FMW:[id number] DOB: June 20, 1996 DOA: 09/01/2023 DOS: the patient was seen and examined on 09/02/2023 PCP: Pcp, No  Patient coming from: Home  Chief Complaint:  Chief Complaint  Patient presents with   Abdominal Pain   Diarrhea   Emesis   HPI: Linda [name] is a 28 y.o. female with medical history significant of ESRD on dialysis MWF, SLE, HTN.  Pt with worsening N/V/D for past 3-4 days.  Last dialysis session was yesterday, full session done without difficulty per pt.  N/V/D associated with abd pain (diffuse, cramping), loose / watery stool.    Review of Systems: As mentioned in the history of present illness. All other systems reviewed and are negative. Past Medical History:  Diagnosis Date   Abnormal ECG    a. In setting of LVH w/ repolarization abnormalities; b. 03/2019 MV: No ischemia/infarct. EF 55-65%.   Angioedema    ESRD (end stage renal disease) (HCC)    a. prev on HD.   GERD (gastroesophageal reflux disease)    Hypertension    Lupus    LVH (left ventricular hypertrophy)    a. 11/2017 Echo: EF 50-55%. triv AI. Mild MR. Mod L and mild R atrial enlargement. Nl RV size/fxn. Small pericardial effusion w/o tampondae; b. 03/2018 Echo: EF 55-60%, restrictive filling pattern. Nl RV size/fxn. Sev LAE. Mild MR, mild to mod TR/PR. Mod PAH. Triv effusion.   Migraines    Mixed connective tissue disease (HCC)    Previous Dialysis patient [facility])    a. Off HD since late 2020.   Septic prepatellar bursitis of right knee 08/19/2017   Past Surgical History:  Procedure Laterality Date   GRAFT APPLICATION Left 03/30/2019   Procedure: BRACHIAL CEPHALIC GRAFT CREATION;  Surgeon: Marea Selinda RAMAN, MD;  Location: [facility] ORS;  Service: Vascular;  Laterality: Left;   I & D EXTREMITY Right 08/06/2017   Procedure: IRRIGATION AND DEBRIDEMENT KNEE;  Surgeon: Kendal Franky SQUIBB, MD;  Location: [facility] OR;  Service: Orthopedics;  Laterality: Right;   IR  THROMBECTOMY AV FISTULA W/THROMBOLYSIS/PTA/STENT INC/SHUNT/IMG LT Left 05/18/2022   IR US  GUIDE VASC ACCESS LEFT  05/18/2022   MULTIPLE TOOTH EXTRACTIONS     Social History:  reports that she has been smoking e-cigarettes. She started smoking about 8 years ago. She has a 2.1 pack-year smoking history. She has never used smokeless tobacco. She reports that she does not drink alcohol and does not use drugs.  Allergies  Allergen Reactions   Lisinopril  Anaphylaxis    Angioedema   Gabapentin Other (See Comments)    Reaction: Tremor (intolerance); tremor   Ativan  [Lorazepam ] Other (See Comments)    Somnolent with 1mg  ativan  at [facility] Nov 2019   Hydroxychloroquine  Other (See Comments)    Pt reports making her skin peel really bad   Vicodin [Hydrocodone -Acetaminophen ] Itching and Rash    Family History  Problem Relation Age of Onset   Lupus Mother    Hypertension Mother    Kidney disease Mother    Heart Problems Mother    Coronary artery disease Mother 64       stent   Diabetes Maternal Grandmother     Prior to Admission medications   Medication Sig Start Date End Date Taking? Authorizing Provider  amLODipine  (NORVASC ) 10 MG tablet Take 10 mg by mouth daily. 08/05/21  Yes [provider]  calcitRIOL  (ROCALTROL ) 0.25 MCG capsule Take 1 tablet by mouth daily.   Yes [provider]  carvedilol  (COREG ) 12.5 MG tablet Take 1 tablet (12.5 mg total) by mouth 2 (two) times daily with a meal. Patient taking differently: Take 25 mg by mouth 2 (two) times daily with a meal. 10/02/21 08/31/24 Yes Dickie Begun, MD  furosemide  (LASIX ) 80 MG tablet Take 80 mg by mouth 2 (two) times daily. 07/30/23  Yes [provider]  lidocaine -prilocaine  (EMLA ) cream Apply 1 Application topically every Monday, Wednesday, and Friday. 11/17/21  Yes [provider]  losartan  (COZAAR ) 50 MG tablet Take 1 tablet (50 mg total) by mouth daily. 10/02/21 08/31/24 Yes Dickie Begun, MD   medroxyPROGESTERone  (DEPO-PROVERA ) 150 MG/ML injection Inject 1 mL (150 mg total) into the muscle every 3 (three) months. 10/29/22  Yes Rudy Carlin LABOR, MD  Methoxy PEG-Epoetin  Beta (MIRCERA IJ) Mircera 09/28/22 09/27/23 Yes [provider]  naloxone (NARCAN) nasal spray 4 mg/0.1 mL SPRAY AS NEEDED   Yes [provider]  Ondansetron  HCl (ZOFRAN  PO) Take 4 mg by mouth daily as needed (for nausea). 10/02/22 10/01/23 Yes [provider]  oxyCODONE  (ROXICODONE ) 15 MG immediate release tablet Take 15 mg by mouth 5 (five) times daily as needed.   Yes [provider]  pantoprazole  (PROTONIX ) 40 MG tablet Take 1 tablet (40 mg total) by mouth 2 (two) times daily. Patient taking differently: Take 40 mg by mouth daily as needed (for acid reflux). 01/28/18 08/31/24 Yes Donnamarie Lebron PARAS, MD  RENVELA  2.4 g PACK Take 7.2 g by mouth 3 (three) times daily with meals. 12/03/22  Yes [provider]  sodium bicarbonate  650 MG tablet Take 1 tablet (650 mg total) by mouth 2 (two) times daily. 09/05/21  Yes Singh, Prashant K, MD  tiZANidine  (ZANAFLEX ) 4 MG tablet Take 4 mg by mouth daily as needed for muscle spasms (for sleep). 02/05/21  Yes [provider]  tretinoin  (RETIN-A ) 0.025 % cream Apply 1 Application topically 3 (three) times a week. 07/05/23  Yes [provider]  Vitamin D, Ergocalciferol, (DRISDOL) 1.25 MG (50000 UNIT) CAPS capsule ergocalciferol (vitamin D2) 1,250 mcg (50,000 unit) capsule TAKE 1 CAPSULE BY MOUTH WEEKLY 03/11/21  Yes [provider]  Safety Seal Miscellaneous MISC Use on the alternate nights when you are not applying tretinoin  (Tuesday, Thursday, Saturday, and Sunday) 05/24/23   Alm Delon SAILOR, DO    Physical Exam: Vitals:   09/01/23 1845 09/01/23 1900 09/01/23 2200 09/01/23 2338  BP: (!) 165/115 (!) 152/100 (!) 148/113 (!) 171/121  Pulse:   73 67  Resp:   15 19  Temp:    98.2 F (36.8 C)  TempSrc:    Oral  SpO2:   100%  100%   Constitutional: NAD, calm, comfortable Respiratory: clear to auscultation bilaterally, no wheezing, no crackles. Normal respiratory effort. No accessory muscle use.  Cardiovascular: Regular rate and rhythm, no murmurs / rubs / gallops. No extremity edema. 2+ pedal pulses. No carotid bruits.  Abdomen: no tenderness, no masses palpated. No hepatosplenomegaly. Bowel sounds positive.  Neurologic: CN 2-12 grossly intact. Sensation intact, DTR normal. Strength 5/5 in all 4.  Psychiatric: Normal judgment and insight. Alert and oriented x 3. Normal mood.   Data Reviewed:    Labs on Admission: I have personally reviewed following labs and imaging studies  CBC: Recent Labs  Lab 09/01/23 1411 09/02/23 0054  WBC 8.8 9.0  HGB 10.5* 9.6*  HCT 32.8* 29.5*  MCV 96.2 94.6  PLT 215 199   Basic Metabolic Panel: Recent Labs  Lab  09/01/23 1411 09/02/23 0054  NA 139 138  K 3.8 3.4*  CL 100 99  CO2 20* 21*  GLUCOSE 80 81  BUN 60* 60*  CREATININE 11.33* 11.29*  CALCIUM  7.8* 7.7*   GFR: CrCl cannot be calculated (Unknown ideal weight.). Liver Function Tests: Recent Labs  Lab 09/01/23 1411  AST 18  ALT 14  ALKPHOS 79  BILITOT 0.7  PROT 8.0  ALBUMIN  3.5   Recent Labs  Lab 09/01/23 1411  LIPASE 36   No results for input(s): AMMONIA in the last 168 hours. Coagulation Profile: No results for input(s): INR, PROTIME in the last 168 hours. Cardiac Enzymes: No results for input(s): CKTOTAL, CKMB, CKMBINDEX, TROPONINI in the last 168 hours. BNP (last 3 results) No results for input(s): PROBNP in the last 8760 hours. HbA1C: No results for input(s): HGBA1C in the last 72 hours. CBG: No results for input(s): GLUCAP in the last 168 hours. Lipid Profile: No results for input(s): CHOL, HDL, LDLCALC, TRIG, CHOLHDL, LDLDIRECT in the last 72 hours. Thyroid Function Tests: No results for input(s): TSH, T4TOTAL, FREET4, T3FREE, THYROIDAB in  the last 72 hours. Anemia Panel: No results for input(s): VITAMINB12, FOLATE, FERRITIN, TIBC, IRON , RETICCTPCT in the last 72 hours. Urine analysis:    Component Value Date/Time   COLORURINE YELLOW 09/01/2023 1423   APPEARANCEUR CLOUDY (A) 09/01/2023 1423   LABSPEC 1.012 09/01/2023 1423   PHURINE 7.0 09/01/2023 1423   GLUCOSEU NEGATIVE 09/01/2023 1423   HGBUR NEGATIVE 09/01/2023 1423   BILIRUBINUR NEGATIVE 09/01/2023 1423   BILIRUBINUR Negative 02/10/2018 1602   KETONESUR 5 (A) 09/01/2023 1423   PROTEINUR >=300 (A) 09/01/2023 1423   UROBILINOGEN 0.2 02/10/2018 1602   UROBILINOGEN 1.0 03/30/2015 2328   NITRITE NEGATIVE 09/01/2023 1423   LEUKOCYTESUR TRACE (A) 09/01/2023 1423    Radiological Exams on Admission: CT ABDOMEN PELVIS WO CONTRAST Result Date: 09/01/2023 CLINICAL DATA:  Abdominal pain, acute, nonlocalized EXAM: CT ABDOMEN AND PELVIS WITHOUT CONTRAST TECHNIQUE: Multidetector CT imaging of the abdomen and pelvis was performed following the standard protocol without IV contrast. RADIATION DOSE REDUCTION: This exam was performed according to the departmental dose-optimization program which includes automated exposure control, adjustment of the mA and/or kV according to patient size and/or use of iterative reconstruction technique. COMPARISON:  CT AP, 12/23/2022 and 08/12/2022. FINDINGS: Lower chest: Cardiac enlargement.  No acute abnormality. Hepatobiliary: Normal noncontrast appearance of the liver without focal abnormality. No gallstones, gallbladder wall thickening, or biliary dilatation. Pancreas: No pancreatic ductal dilatation or surrounding inflammatory changes. Spleen: Normal in size without focal abnormality. Adrenals/Urinary Tract: Adrenal glands are unremarkable. Kidneys are normal, without renal calculi, focal lesion, or hydronephrosis. Bladder is unremarkable. Stomach/Bowel: Stomach is within normal limits. Appendix appears normal. No evidence of bowel wall  thickening, distention, or inflammatory changes. Vascular/Lymphatic: No significant vascular findings are present. No enlarged abdominal or pelvic lymph nodes. Reproductive: Uterus and adnexa are unremarkable. Other: No abdominal wall hernia. Trace free fluid within the dependent pelvis, physiologic in a female of child-bearing age. Musculoskeletal: Subcutaneous calcification along the gluteal soft tissues, likely prior injection sites. BILATERAL L5 pars defects. No anterolisthesis. No acute osseous findings. IMPRESSION: 1. No acute abdominopelvic process. 2. Cardiomegaly. Additional incidental, chronic and senescent findings as above. Electronically Signed   By: Thom Hall M.D.   On: 09/01/2023 18:55   DG Chest Port 1 View Result Date: 09/01/2023 CLINICAL DATA:  ESRD on HD, weakness EXAM: PORTABLE CHEST 1 VIEW COMPARISON:  CT AP, concurrent.  Chest XR, 09/29/2021.  FINDINGS: Enlarged cardiac silhouette. Lungs are well inflated. No focal consolidation or mass. No pleural effusion or pneumothorax. Vascular graft of the LEFT upper extremity. No acute displaced fracture. IMPRESSION: Cardiomegaly without acute superimposed cardiopulmonary process Electronically Signed   By: Thom Hall M.D.   On: 09/01/2023 18:45    EKG: Independently reviewed.   Assessment and Plan: * Nausea vomiting and diarrhea Suspected to have viral gastroenteritis most likely.  (Also, known nationwide outbreak of norovirus reported in news this past week). Clear liquid diet as tolerated PRN zofran  IVF: got 500cc bolus in ED BP on high side of normal, will wait for lactic acid level to come back before giving any more for the moment Holding lasix  Check lactate PRN dilaudid  for pain GI pathogen pnl pending  ESRD (end stage renal disease) (HCC) Pt with reported full dialysis session yesterday. Nephrology notified of pt admission by EDP.  Calciphylaxis PMPaware reviewed: pt on chronic Oxy IR 15mg  with most recent script  filled 07/31/24 #150 tabs / 30 days. Due to N/V/D: using Dilaudid  IV PRN instead for pain for the moment. Cont home phosphate binders  Hypertension Cont home BP meds.  Mixed connective tissue disease (HCC) Home med rec pending Though at first glance, dont see any DMARDs listed.      Advance Care Planning:   Code Status: Full Code  Consults: None  Family Communication: No family in room  Severity of Illness: The appropriate patient status for this patient is OBSERVATION. Observation status is judged to be reasonable and necessary in order to provide the required intensity of service to ensure the patient's safety. The patient's presenting symptoms, physical exam findings, and initial radiographic and laboratory data in the context of their medical condition is felt to place them at decreased risk for further clinical deterioration. Furthermore, it is anticipated that the patient will be medically stable for discharge from the hospital within 2 midnights of admission.   Author: Akif Weldy M., DO 09/02/2023 2:00 AM  For on call review [email or web address].

## 2023-09-02 NOTE — Plan of Care (Signed)

## 2023-09-03 DIAGNOSIS — R197 Diarrhea, unspecified: Secondary | ICD-10-CM | POA: Diagnosis not present

## 2023-09-03 DIAGNOSIS — R112 Nausea with vomiting, unspecified: Secondary | ICD-10-CM | POA: Diagnosis not present

## 2023-09-03 LAB — RENAL FUNCTION PANEL
Albumin: 2.9 g/dL — ABNORMAL LOW (ref 3.5–5.0)
Anion gap: 18 — ABNORMAL HIGH (ref 5–15)
BUN: 63 mg/dL — ABNORMAL HIGH (ref 6–20)
CO2: 19 mmol/L — ABNORMAL LOW (ref 22–32)
Calcium: 8.5 mg/dL — ABNORMAL LOW (ref 8.9–10.3)
Chloride: 101 mmol/L (ref 98–111)
Creatinine, Ser: 12.17 mg/dL — ABNORMAL HIGH (ref 0.44–1.00)
GFR, Estimated: 4 mL/min — ABNORMAL LOW (ref >60)
Glucose, Bld: 73 mg/dL (ref 70–99)
Phosphorus: 6.7 mg/dL — ABNORMAL HIGH (ref 2.5–4.6)
Potassium: 3.8 mmol/L (ref 3.5–5.1)
Sodium: 138 mmol/L (ref 135–145)

## 2023-09-03 LAB — CBC WITH DIFFERENTIAL/PLATELET
Abs Immature Granulocytes: 0.03 10*3/uL (ref 0.00–0.07)
Basophils Absolute: 0.1 10*3/uL (ref 0.0–0.1)
Basophils Relative: 1 %
Eosinophils Absolute: 0.5 10*3/uL (ref 0.0–0.5)
Eosinophils Relative: 6 %
HCT: 31.2 % — ABNORMAL LOW (ref 36.0–46.0)
Hemoglobin: 9.9 g/dL — ABNORMAL LOW (ref 12.0–15.0)
Immature Granulocytes: 0 %
Lymphocytes Relative: 32 %
Lymphs Abs: 2.6 10*3/uL (ref 0.7–4.0)
MCH: 31.1 pg (ref 26.0–34.0)
MCHC: 31.7 g/dL (ref 30.0–36.0)
MCV: 98.1 fL (ref 80.0–100.0)
Monocytes Absolute: 0.8 10*3/uL (ref 0.1–1.0)
Monocytes Relative: 10 %
Neutro Abs: 4.3 10*3/uL (ref 1.7–7.7)
Neutrophils Relative %: 51 %
Platelets: 182 10*3/uL (ref 150–400)
RBC: 3.18 MIL/uL — ABNORMAL LOW (ref 3.87–5.11)
RDW: 17.2 % — ABNORMAL HIGH (ref 11.5–15.5)
WBC: 8.3 10*3/uL (ref 4.0–10.5)
nRBC: 0 % (ref 0.0–0.2)

## 2023-09-03 LAB — HEPATITIS B SURFACE ANTIBODY, QUANTITATIVE: Hep B S AB Quant (Post): 10.8 m[IU]/mL

## 2023-09-03 MED ORDER — CARVEDILOL 25 MG PO TABS: 25.0000 mg | ORAL_TABLET | Freq: Two times a day (BID) | ORAL | Status: DC

## 2023-09-03 NOTE — Discharge Summary (Signed)
 Physician Discharge Summary  Linda [name] FMW:[id number] DOB: 1995-09-10 DOA: 09/01/2023  PCP: Pcp, No  Admit date: 09/01/2023 Discharge date: 09/03/2023  Time spent: 26 minutes  Recommendations for Outpatient Follow-up:  Recommend outpatient routine labs Recommend iron  levels in about 1 month  Discharge Diagnoses:  MAIN problem for hospitalization   Likely viral gastroenteritis which self resolved prior to discharge  Please see below for itemized issues addressed in HOpsital- refer to other progress notes for clarity if needed  Discharge Condition: Improved  Diet recommendation: Renal  Filed Weights   09/02/23 2224  Weight: 75 kg    History of present illness:  28 year old known SLE since age 8 or 19 with 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 Delon Lenis with Retin-A  other meds HTN Latent TB on rifampin  Calciphylaxis with known nodules knees elbows buttocks previously treated with doxycycline  ESRD MWF via L brachial AVF since 08/2021-anemia renal disease   Admit from ED Intermittent burning abdominal chest pain LEU numbness additionally worsening nausea and loose watery stool diffuse abdominal cramping  BUN/creatinine 60/11.3 troponin 30 with gray zone in the 30s WBC 8.8 hemoglobin 10.5 platelet 215--- UA >300 protein CT ABD no acute abdominopelvic process.  She was admitted over 1/2 through 09/03/2023 and had no further diarrhea asked for diet on 1/2 tolerated the same diet without issues and had no further diarrhea and/or nausea vomiting She was resumed at discharge on all of her usual meds and requested a primary care physician and I have made a referral to [facility] for her It would be a good idea for her to get labs in about 1 week and follow-up in the outpatient setting however this can be done nonemergent She should be seen by her usual other specialists with dermatology and follow-up for routine  dialysis  Discharge Exam: Vitals:   09/02/23 1624 09/03/23 0412  BP: (!) 149/103 (!) 161/105  Pulse: 67 70  Resp: 18 18  Temp: 97.9 F (36.6 C) (!) 97 F (36.1 C)  SpO2: 98% 98%    Subj on day of d/c   Awake coherent no distress  General Exam on discharge  EOMI NCAT no focal deficit no icterus no pallor no rales or rhonchi Chest clear no added sound Slightly tender abdomen ROM intact Power 5/5  Discharge Instructions   Discharge Instructions     Diet - low sodium heart healthy   Complete by: As directed    Discharge instructions   Complete by: As directed    Your diagnosis hospital stay with a probable viral infection and I do not think that you need any further treatment-it would be a good idea for you to follow-up with your primary physician and/or your nephrologist-we will try to obtain primary care referral for you in the outpatient setting Please follow-up with all of your other specialists Please take your meds without change  Report high-grade fevers nausea vomiting chills rigors if they occur   Increase activity slowly   Complete by: As directed       Allergies as of 09/03/2023       Reactions   Lisinopril  Anaphylaxis   Angioedema   Gabapentin Other (See Comments)   Reaction: Tremor (intolerance); tremor   Ativan  [lorazepam ] Other (See Comments)   Somnolent with 1mg  ativan  at [facility] Nov 2019   Hydroxychloroquine  Other (See Comments)   Pt reports making her skin peel really bad   Vicodin [hydrocodone -acetaminophen ] Itching, Rash  Medication List     TAKE these medications    amLODipine  10 MG tablet Commonly known as: NORVASC  Take 10 mg by mouth daily.   calcitRIOL  0.25 MCG capsule Commonly known as: ROCALTROL  Take 1 tablet by mouth daily.   carvedilol  25 MG tablet Commonly known as: COREG  Take 1 tablet (25 mg total) by mouth 2 (two) times daily with a meal. What changed:  medication strength how much to take   furosemide   80 MG tablet Commonly known as: LASIX  Take 80 mg by mouth 2 (two) times daily.   lidocaine -prilocaine  cream Commonly known as: EMLA  Apply 1 Application topically every Monday, Wednesday, and Friday.   losartan  50 MG tablet Commonly known as: COZAAR  Take 1 tablet (50 mg total) by mouth daily.   medroxyPROGESTERone  150 MG/ML injection Commonly known as: DEPO-PROVERA  Inject 1 mL (150 mg total) into the muscle every 3 (three) months.   MIRCERA IJ Mircera   Narcan 4 MG/0.1ML Liqd nasal spray kit Generic drug: naloxone SPRAY AS NEEDED   oxyCODONE  15 MG immediate release tablet Commonly known as: ROXICODONE  Take 15 mg by mouth 5 (five) times daily as needed.   pantoprazole  40 MG tablet Commonly known as: PROTONIX  Take 1 tablet (40 mg total) by mouth 2 (two) times daily. What changed:  when to take this reasons to take this   Renvela  2.4 g Pack Generic drug: sevelamer  carbonate Take 7.2 g by mouth 3 (three) times daily with meals.   Safety Seal Miscellaneous Misc Use on the alternate nights when you are not applying tretinoin  (Tuesday, Thursday, Saturday, and Sunday)   sodium bicarbonate  650 MG tablet Take 1 tablet (650 mg total) by mouth 2 (two) times daily.   tiZANidine  4 MG tablet Commonly known as: ZANAFLEX  Take 4 mg by mouth daily as needed for muscle spasms (for sleep).   tretinoin  0.025 % cream Commonly known as: RETIN-A  Apply 1 Application topically 3 (three) times a week.   Vitamin D (Ergocalciferol) 1.25 MG (50000 UNIT) Caps capsule Commonly known as: DRISDOL ergocalciferol (vitamin D2) 1,250 mcg (50,000 unit) capsule TAKE 1 CAPSULE BY MOUTH WEEKLY   ZOFRAN  PO Take 4 mg by mouth daily as needed (for nausea).       Allergies  Allergen Reactions   Lisinopril  Anaphylaxis    Angioedema   Gabapentin Other (See Comments)    Reaction: Tremor (intolerance); tremor   Ativan  [Lorazepam ] Other (See Comments)    Somnolent with 1mg  ativan  at [facility] Nov  2019   Hydroxychloroquine  Other (See Comments)    Pt reports making her skin peel really bad   Vicodin [Hydrocodone -Acetaminophen ] Itching and Rash      The results of significant diagnostics from this hospitalization (including imaging, microbiology, ancillary and laboratory) are listed below for reference.    Significant Diagnostic Studies: CT ABDOMEN PELVIS WO CONTRAST Result Date: 09/01/2023 CLINICAL DATA:  Abdominal pain, acute, nonlocalized EXAM: CT ABDOMEN AND PELVIS WITHOUT CONTRAST TECHNIQUE: Multidetector CT imaging of the abdomen and pelvis was performed following the standard protocol without IV contrast. RADIATION DOSE REDUCTION: This exam was performed according to the departmental dose-optimization program which includes automated exposure control, adjustment of the mA and/or kV according to patient size and/or use of iterative reconstruction technique. COMPARISON:  CT AP, 12/23/2022 and 08/12/2022. FINDINGS: Lower chest: Cardiac enlargement.  No acute abnormality. Hepatobiliary: Normal noncontrast appearance of the liver without focal abnormality. No gallstones, gallbladder wall thickening, or biliary dilatation. Pancreas: No pancreatic ductal dilatation or surrounding inflammatory changes.  Spleen: Normal in size without focal abnormality. Adrenals/Urinary Tract: Adrenal glands are unremarkable. Kidneys are normal, without renal calculi, focal lesion, or hydronephrosis. Bladder is unremarkable. Stomach/Bowel: Stomach is within normal limits. Appendix appears normal. No evidence of bowel wall thickening, distention, or inflammatory changes. Vascular/Lymphatic: No significant vascular findings are present. No enlarged abdominal or pelvic lymph nodes. Reproductive: Uterus and adnexa are unremarkable. Other: No abdominal wall hernia. Trace free fluid within the dependent pelvis, physiologic in a female of child-bearing age. Musculoskeletal: Subcutaneous calcification along the gluteal soft  tissues, likely prior injection sites. BILATERAL L5 pars defects. No anterolisthesis. No acute osseous findings. IMPRESSION: 1. No acute abdominopelvic process. 2. Cardiomegaly. Additional incidental, chronic and senescent findings as above. Electronically Signed   By: Thom Hall M.D.   On: 09/01/2023 18:55   DG Chest Port 1 View Result Date: 09/01/2023 CLINICAL DATA:  ESRD on HD, weakness EXAM: PORTABLE CHEST 1 VIEW COMPARISON:  CT AP, concurrent.  Chest XR, 09/29/2021. FINDINGS: Enlarged cardiac silhouette. Lungs are well inflated. No focal consolidation or mass. No pleural effusion or pneumothorax. Vascular graft of the LEFT upper extremity. No acute displaced fracture. IMPRESSION: Cardiomegaly without acute superimposed cardiopulmonary process Electronically Signed   By: Thom Hall M.D.   On: 09/01/2023 18:45    Microbiology: Recent Results (from the past 240 hours)  MRSA Next Gen by PCR, Nasal     Status: None   Collection Time: 09/02/23  6:06 AM   Specimen: Nasal Mucosa; Nasal Swab  Result Value Ref Range Status   MRSA by PCR Next Gen NOT DETECTED NOT DETECTED Final    Comment: (NOTE) The GeneXpert MRSA Assay (FDA approved for NASAL specimens only), is one component of a comprehensive MRSA colonization surveillance program. It is not intended to diagnose MRSA infection nor to guide or monitor treatment for MRSA infections. Test performance is not FDA approved in patients less than 70 years old. Performed at [facility], [address], [place], [place] [address]      Labs: Basic Metabolic Panel: Recent Labs  Lab 09/01/23 1411 09/02/23 0054  NA 139 138  K 3.8 3.4*  CL 100 99  CO2 20* 21*  GLUCOSE 80 81  BUN 60* 60*  CREATININE 11.33* 11.29*  CALCIUM  7.8* 7.7*   Liver Function Tests: Recent Labs  Lab 09/01/23 1411  AST 18  ALT 14  ALKPHOS 79  BILITOT 0.7  PROT 8.0  ALBUMIN  3.5   Recent Labs  Lab 09/01/23 1411  LIPASE 36   No results for input(s):  AMMONIA in the last 168 hours. CBC: Recent Labs  Lab 09/01/23 1411 09/02/23 0054  WBC 8.8 9.0  HGB 10.5* 9.6*  HCT 32.8* 29.5*  MCV 96.2 94.6  PLT 215 199   Cardiac Enzymes: No results for input(s): CKTOTAL, CKMB, CKMBINDEX, TROPONINI in the last 168 hours. BNP: BNP (last 3 results) No results for input(s): BNP in the last 8760 hours.  ProBNP (last 3 results) No results for input(s): PROBNP in the last 8760 hours.  CBG: No results for input(s): GLUCAP in the last 168 hours.  Signed:  Jai-Gurmukh Armond Cuthrell MD   [facility] 09/03/2023, 7:58 AM

## 2023-09-03 NOTE — Progress Notes (Signed)
 AVS given to patient and explained all questions answered.  Patient to discharge after Hemodialysis and will discharge from the floor.

## 2023-09-03 NOTE — Progress Notes (Signed)
 Pt to d/c today. Case discussed with nephrologist and renal PA. Both felt pt appropriate to receive out-pt HD at pt's regular clinic today. Contacted [facility] and spoke to Consulting Civil Engineer. Clinic can treat pt today. Pt will need to arrive at 11:15 today for 11:30 chair time. Met with pt at bedside. Discussed above information. Pt agreeable to out-pt HD treatment today and states she can get a ride to HD clinic after d/c from hospital. Update provided to nephrologist, renal PA, pt'ts RN,and RN CM. Spoke to RN at clinic to be advised that pt will d/c from hospital and go directly to clinic for HD treatment today. Renal PA to send orders to clinic. Inpt HD unit staff made aware of this info as well.   Randine Mungo Renal Navigator [phone]

## 2023-09-03 NOTE — Discharge Planning (Signed)
 [facility] Patient Discharge Orders- [facility] CLINIC: [facility]  Patient's name: DAYJA LOVERIDGE Admit/DC Dates: 09/01/2023 - 09/03/2023  Discharge Diagnoses: Viral gastroenteritis    Recent Labs  Lab 09/03/23 0637  HGB 9.9*  K 3.8  CALCIUM  8.5*  PHOS 6.7*  ALBUMIN  2.9*    Aranesp : Given: --   Date of last dose/amount: --   PRBC's Given: -- Date/# of units: --  Mircera 150 mcg IV q 2 weeks (Please give today)   Outpatient Dialysis Orders:  -Heparin : No change -EDW No change  -Bath: No change  -Access intervention/Change: ---  -IV Antibiotics: --  OTHER/APPTS/LABS    Completed by: Maisie Ronnald Acosta PA-C   D/C Meds to be reconciled by nurse after every discharge.    Reviewed by: MD:______ RN_______

## 2023-09-03 NOTE — Progress Notes (Signed)
 [facility] Progress Note   Subjective: Seen and examined at bedside. Feeling much better was able to eat breakfast.  Plans for discharge today. She will return to her OP clinic for dialysis.   Objective Vitals:   09/02/23 2224 09/03/23 0412 09/03/23 0759 09/03/23 0855  BP:  (!) 161/105 (!) 165/128 (!) 149/112  Pulse:  70 64 67  Resp:  18 19   Temp:  (!) 97 F (36.1 C) 98.3 F (36.8 C)   TempSrc:  Oral Oral   SpO2:  98% 92%   Weight: 75 kg     Height: 5' 6 (1.676 m)        Additional Objective Labs: Basic Metabolic Panel: Recent Labs  Lab 09/01/23 1411 09/02/23 0054 09/03/23 0637  NA 139 138 138  K 3.8 3.4* 3.8  CL 100 99 101  CO2 20* 21* 19*  GLUCOSE 80 81 73  BUN 60* 60* 63*  CREATININE 11.33* 11.29* 12.17*  CALCIUM  7.8* 7.7* 8.5*  PHOS  --   --  6.7*   CBC: Recent Labs  Lab 09/01/23 1411 09/02/23 0054 09/03/23 0637  WBC 8.8 9.0 8.3  NEUTROABS  --   --  4.3  HGB 10.5* 9.6* 9.9*  HCT 32.8* 29.5* 31.2*  MCV 96.2 94.6 98.1  PLT 215 199 182   Blood Culture    Component Value Date/Time   SDES  03/03/2018 0027    BLOOD RIGHT HAND Performed at [facility], [address] [place], [place] [address]    SPECREQUEST AEROBIC BOTTLE ONLY Blood Culture adequate volume 03/03/2018 0027   CULT  03/03/2018 0027    NO GROWTH 5 DAYS Performed at [facility], [address], [place], [place] [address]    REPTSTATUS 03/08/2018 FINAL 03/03/2018 0027     Physical Exam General: Lying in bed, nad Heart: RRR Lungs: Normal wob Abdomen: soft  Extremities: No LE edema  Dialysis Access: LUE AVG +bruit   Medications:   amLODipine   10 mg Oral Daily   calcitRIOL   0.25 mcg Oral Daily   carvedilol   25 mg Oral BID WC   Chlorhexidine  Gluconate Cloth  6 each Topical Q0600   doxercalciferol   9 mcg Intravenous Q M,W,F-HD   heparin   5,000 Units Subcutaneous Q8H   lidocaine -prilocaine   1 Application Topical Q M,W,F   losartan    50 mg Oral Daily   sevelamer  carbonate  2.4 g Oral TID WC   sodium bicarbonate   650 mg Oral BID    Dialysis Orders:  [facility] on MWF 3 hour 30 min BFR 250 DFR 500 EDW 73.9kg 2K 2Ca AVG 15g  No heparin  Mircera 150mcg IV q 2 weeks- last dose was 100mcg on 08/09/23 Hectorol  9mcg IV q HD Sensipar  150mg  PO q HD   Assessment/Plan:  Gastroenteritis: improved with supportive treatment, mgt per admitting team  ESRD:  Pt had 2 hours of HD on 12/31 and missed 5 treatments prior. Fortunately she seems to have some residual kidney function and good UOP, does not seem overly volume overloaded or uremic on exam.  Will return to OP center for dialysis today 09/03/23 Hypertension/volume: BP moderately elevated. Continue home BP meds.   Anemia: Hgb 9.6, overdue for ESA. Will order with next HD  Metabolic bone disease: Calcium  on the low side, will change to 2.5Ca bat for now. Continue hectorol , sensipar , and phos binders.  Nutrition:  Advancing as tolerated per PMD  Maisie Ronnald Acosta PA-C [facility] 09/03/2023,9:35  AM

## 2023-09-03 NOTE — TOC Transition Note (Signed)
 Transition of Care [facility]) - Discharge Note   Patient Details  Name: Linda [name] MRN: [id number] Date of Birth: Apr 16, 1996  Transition of Care [facility]) CM/SW Contact:  Hendricks KANDICE Her, RN Phone Number: 09/03/2023, 8:55 AM   Clinical Narrative:     Patient to DC to home today. No TOC needs identified. Patient will follow up as directed on AVS.           Patient Goals and CMS Choice            Discharge Placement                       Discharge Plan and Services Additional resources added to the After Visit Summary for                                       Social Drivers of Health (SDOH) Interventions SDOH Screenings   Food Insecurity: No Food Insecurity (09/02/2023)  Housing: Low Risk  (09/02/2023)  Transportation Needs: No Transportation Needs (09/02/2023)  Utilities: Not At Risk (09/02/2023)  Depression (PHQ2-9): Low Risk  (02/23/2020)  Social Connections: Unknown (01/12/2022)   Received from [facility], [facility]  Tobacco Use: High Risk (09/02/2023)     Readmission Risk Interventions    10/02/2021    9:11 AM 10/01/2021    2:34 PM  Readmission Risk Prevention Plan  Transportation Screening  Complete  PCP or Specialist Appt within 3-5 Days Complete --  HRI or Home Care Consult Complete Not Complete  HRI or Home Care Consult comments  Lives with grandmother. No recommendations from PT/OT  Social Work Consult for Recovery Care Planning/Counseling  Complete  Palliative Care Screening  Not Applicable  Medication Review [employer])  Complete

## 2023-09-04 ENCOUNTER — Telehealth: Payer: Self-pay | Admitting: Nephrology

## 2023-09-04 NOTE — Telephone Encounter (Signed)
 Transition of care contact from inpatient facility  Date of Discharge: 09/03/23 Date of Contact: 09/04/23 Method of contact: Phone  Attempted to contact patient to discuss transition of care from inpatient admission. Phone number not in service.

## 2023-09-06 DIAGNOSIS — D631 Anemia in chronic kidney disease: Secondary | ICD-10-CM | POA: Diagnosis not present

## 2023-09-06 DIAGNOSIS — N186 End stage renal disease: Secondary | ICD-10-CM | POA: Diagnosis not present

## 2023-09-06 DIAGNOSIS — D509 Iron deficiency anemia, unspecified: Secondary | ICD-10-CM | POA: Diagnosis not present

## 2023-09-06 DIAGNOSIS — N2581 Secondary hyperparathyroidism of renal origin: Secondary | ICD-10-CM | POA: Diagnosis not present

## 2023-09-06 DIAGNOSIS — Z992 Dependence on renal dialysis: Secondary | ICD-10-CM | POA: Diagnosis not present

## 2023-09-08 DIAGNOSIS — N186 End stage renal disease: Secondary | ICD-10-CM | POA: Diagnosis not present

## 2023-09-08 DIAGNOSIS — D631 Anemia in chronic kidney disease: Secondary | ICD-10-CM | POA: Diagnosis not present

## 2023-09-08 DIAGNOSIS — N2581 Secondary hyperparathyroidism of renal origin: Secondary | ICD-10-CM | POA: Diagnosis not present

## 2023-09-08 DIAGNOSIS — Z992 Dependence on renal dialysis: Secondary | ICD-10-CM | POA: Diagnosis not present

## 2023-09-08 DIAGNOSIS — D509 Iron deficiency anemia, unspecified: Secondary | ICD-10-CM | POA: Diagnosis not present

## 2023-09-10 DIAGNOSIS — L930 Discoid lupus erythematosus: Secondary | ICD-10-CM | POA: Diagnosis not present

## 2023-09-10 DIAGNOSIS — N186 End stage renal disease: Secondary | ICD-10-CM | POA: Diagnosis not present

## 2023-09-10 DIAGNOSIS — Z992 Dependence on renal dialysis: Secondary | ICD-10-CM | POA: Diagnosis not present

## 2023-09-10 DIAGNOSIS — I10 Essential (primary) hypertension: Secondary | ICD-10-CM | POA: Diagnosis not present

## 2023-09-10 DIAGNOSIS — R109 Unspecified abdominal pain: Secondary | ICD-10-CM | POA: Diagnosis not present

## 2023-09-11 ENCOUNTER — Emergency Department (HOSPITAL_COMMUNITY): Payer: 59

## 2023-09-11 ENCOUNTER — Encounter (HOSPITAL_COMMUNITY): Payer: Self-pay

## 2023-09-11 ENCOUNTER — Inpatient Hospital Stay (HOSPITAL_COMMUNITY)
Admission: EM | Admit: 2023-09-11 | Discharge: 2023-09-13 | DRG: 291 | Disposition: A | Discharge status: Home / Self Care | Payer: 59 | Attending: Internal Medicine | Admitting: Internal Medicine

## 2023-09-11 ENCOUNTER — Other Ambulatory Visit: Payer: Self-pay

## 2023-09-11 DIAGNOSIS — Z79899 Other long term (current) drug therapy: Secondary | ICD-10-CM | POA: Diagnosis not present

## 2023-09-11 DIAGNOSIS — R0989 Other specified symptoms and signs involving the circulatory and respiratory systems: Secondary | ICD-10-CM | POA: Diagnosis not present

## 2023-09-11 DIAGNOSIS — E875 Hyperkalemia: Secondary | ICD-10-CM | POA: Diagnosis present

## 2023-09-11 DIAGNOSIS — R0902 Hypoxemia: Secondary | ICD-10-CM | POA: Diagnosis not present

## 2023-09-11 DIAGNOSIS — N186 End stage renal disease: Secondary | ICD-10-CM | POA: Diagnosis present

## 2023-09-11 DIAGNOSIS — R0602 Shortness of breath: Secondary | ICD-10-CM | POA: Diagnosis not present

## 2023-09-11 DIAGNOSIS — Z888 Allergy status to other drugs, medicaments and biological substances status: Secondary | ICD-10-CM | POA: Diagnosis not present

## 2023-09-11 DIAGNOSIS — N2581 Secondary hyperparathyroidism of renal origin: Secondary | ICD-10-CM | POA: Diagnosis not present

## 2023-09-11 DIAGNOSIS — M25511 Pain in right shoulder: Secondary | ICD-10-CM | POA: Diagnosis not present

## 2023-09-11 DIAGNOSIS — M7918 Myalgia, other site: Secondary | ICD-10-CM

## 2023-09-11 DIAGNOSIS — I132 Hypertensive heart and chronic kidney disease with heart failure and with stage 5 chronic kidney disease, or end stage renal disease: Principal | ICD-10-CM | POA: Diagnosis present

## 2023-09-11 DIAGNOSIS — Z885 Allergy status to narcotic agent status: Secondary | ICD-10-CM

## 2023-09-11 DIAGNOSIS — J810 Acute pulmonary edema: Secondary | ICD-10-CM

## 2023-09-11 DIAGNOSIS — J9601 Acute respiratory failure with hypoxia: Secondary | ICD-10-CM | POA: Diagnosis not present

## 2023-09-11 DIAGNOSIS — I5043 Acute on chronic combined systolic (congestive) and diastolic (congestive) heart failure: Secondary | ICD-10-CM | POA: Diagnosis present

## 2023-09-11 DIAGNOSIS — Z992 Dependence on renal dialysis: Secondary | ICD-10-CM

## 2023-09-11 DIAGNOSIS — Z8269 Family history of other diseases of the musculoskeletal system and connective tissue: Secondary | ICD-10-CM | POA: Diagnosis not present

## 2023-09-11 DIAGNOSIS — D631 Anemia in chronic kidney disease: Secondary | ICD-10-CM | POA: Diagnosis not present

## 2023-09-11 DIAGNOSIS — M311 Thrombotic microangiopathy, unspecified: Secondary | ICD-10-CM | POA: Diagnosis not present

## 2023-09-11 DIAGNOSIS — R079 Chest pain, unspecified: Secondary | ICD-10-CM | POA: Diagnosis not present

## 2023-09-11 DIAGNOSIS — K219 Gastro-esophageal reflux disease without esophagitis: Secondary | ICD-10-CM | POA: Diagnosis not present

## 2023-09-11 DIAGNOSIS — M545 Low back pain, unspecified: Secondary | ICD-10-CM | POA: Diagnosis not present

## 2023-09-11 DIAGNOSIS — F17290 Nicotine dependence, other tobacco product, uncomplicated: Secondary | ICD-10-CM | POA: Diagnosis present

## 2023-09-11 DIAGNOSIS — M549 Dorsalgia, unspecified: Secondary | ICD-10-CM | POA: Diagnosis not present

## 2023-09-11 DIAGNOSIS — Z1152 Encounter for screening for COVID-19: Secondary | ICD-10-CM | POA: Diagnosis not present

## 2023-09-11 DIAGNOSIS — J811 Chronic pulmonary edema: Secondary | ICD-10-CM | POA: Diagnosis present

## 2023-09-11 DIAGNOSIS — I5033 Acute on chronic diastolic (congestive) heart failure: Secondary | ICD-10-CM | POA: Diagnosis not present

## 2023-09-11 DIAGNOSIS — Z841 Family history of disorders of kidney and ureter: Secondary | ICD-10-CM | POA: Diagnosis not present

## 2023-09-11 DIAGNOSIS — M329 Systemic lupus erythematosus, unspecified: Secondary | ICD-10-CM | POA: Diagnosis not present

## 2023-09-11 DIAGNOSIS — I517 Cardiomegaly: Secondary | ICD-10-CM | POA: Diagnosis not present

## 2023-09-11 DIAGNOSIS — Z833 Family history of diabetes mellitus: Secondary | ICD-10-CM | POA: Diagnosis not present

## 2023-09-11 DIAGNOSIS — I10 Essential (primary) hypertension: Secondary | ICD-10-CM | POA: Diagnosis not present

## 2023-09-11 DIAGNOSIS — Z8249 Family history of ischemic heart disease and other diseases of the circulatory system: Secondary | ICD-10-CM | POA: Diagnosis not present

## 2023-09-11 LAB — URINALYSIS, ROUTINE W REFLEX MICROSCOPIC
Bilirubin Urine: NEGATIVE
Glucose, UA: NEGATIVE mg/dL
Hgb urine dipstick: NEGATIVE
Ketones, ur: NEGATIVE mg/dL
Leukocytes,Ua: NEGATIVE
Nitrite: NEGATIVE
Protein, ur: >=300 mg/dL — AB
Specific Gravity, Urine: 1.008 (ref 1.005–1.030)
pH: 9 — ABNORMAL HIGH (ref 5.0–8.0)

## 2023-09-11 LAB — PHOSPHORUS: Phosphorus: 5.4 mg/dL — ABNORMAL HIGH (ref 2.5–4.6)

## 2023-09-11 LAB — COMPREHENSIVE METABOLIC PANEL
ALT: 16 U/L (ref 0–44)
AST: 21 U/L (ref 15–41)
Albumin: 3.6 g/dL (ref 3.5–5.0)
Alkaline Phosphatase: 92 U/L (ref 38–126)
Anion gap: 13 (ref 5–15)
BUN: 50 mg/dL — ABNORMAL HIGH (ref 6–20)
CO2: 23 mmol/L (ref 22–32)
Calcium: 8.9 mg/dL (ref 8.9–10.3)
Chloride: 104 mmol/L (ref 98–111)
Creatinine, Ser: 9.34 mg/dL — ABNORMAL HIGH (ref 0.44–1.00)
GFR, Estimated: 5 mL/min — ABNORMAL LOW (ref >60)
Glucose, Bld: 87 mg/dL (ref 70–99)
Potassium: 5.5 mmol/L — ABNORMAL HIGH (ref 3.5–5.1)
Sodium: 140 mmol/L (ref 135–145)
Total Bilirubin: 1 mg/dL (ref 0.0–1.2)
Total Protein: 7.7 g/dL (ref 6.5–8.1)

## 2023-09-11 LAB — TROPONIN I (HIGH SENSITIVITY)
Troponin I (High Sensitivity): 31 ng/L — ABNORMAL HIGH (ref <18)
Troponin I (High Sensitivity): 35 ng/L — ABNORMAL HIGH (ref <18)

## 2023-09-11 LAB — CREATININE, SERUM
Creatinine, Ser: 9.59 mg/dL — ABNORMAL HIGH (ref 0.44–1.00)
GFR, Estimated: 5 mL/min — ABNORMAL LOW (ref >60)

## 2023-09-11 LAB — CBC
HCT: 34.5 % — ABNORMAL LOW (ref 36.0–46.0)
Hemoglobin: 10.7 g/dL — ABNORMAL LOW (ref 12.0–15.0)
MCH: 31.1 pg (ref 26.0–34.0)
MCHC: 31 g/dL (ref 30.0–36.0)
MCV: 100.3 fL — ABNORMAL HIGH (ref 80.0–100.0)
Platelets: 207 10*3/uL (ref 150–400)
RBC: 3.44 MIL/uL — ABNORMAL LOW (ref 3.87–5.11)
RDW: 19 % — ABNORMAL HIGH (ref 11.5–15.5)
WBC: 14.9 10*3/uL — ABNORMAL HIGH (ref 4.0–10.5)
nRBC: 0 % (ref 0.0–0.2)

## 2023-09-11 LAB — RESP PANEL BY RT-PCR (RSV, FLU A&B, COVID)  RVPGX2
Influenza A by PCR: NEGATIVE
Influenza B by PCR: NEGATIVE
Resp Syncytial Virus by PCR: NEGATIVE
SARS Coronavirus 2 by RT PCR: NEGATIVE

## 2023-09-11 LAB — MAGNESIUM: Magnesium: 2.2 mg/dL (ref 1.7–2.4)

## 2023-09-11 LAB — CBC WITH DIFFERENTIAL/PLATELET
Abs Immature Granulocytes: 0.06 10*3/uL (ref 0.00–0.07)
Basophils Absolute: 0.1 10*3/uL (ref 0.0–0.1)
Basophils Relative: 1 %
Eosinophils Absolute: 0.5 10*3/uL (ref 0.0–0.5)
Eosinophils Relative: 3 %
HCT: 33.8 % — ABNORMAL LOW (ref 36.0–46.0)
Hemoglobin: 10.3 g/dL — ABNORMAL LOW (ref 12.0–15.0)
Immature Granulocytes: 0 %
Lymphocytes Relative: 13 %
Lymphs Abs: 1.9 10*3/uL (ref 0.7–4.0)
MCH: 30.7 pg (ref 26.0–34.0)
MCHC: 30.5 g/dL (ref 30.0–36.0)
MCV: 100.9 fL — ABNORMAL HIGH (ref 80.0–100.0)
Monocytes Absolute: 0.8 10*3/uL (ref 0.1–1.0)
Monocytes Relative: 6 %
Neutro Abs: 11.5 10*3/uL — ABNORMAL HIGH (ref 1.7–7.7)
Neutrophils Relative %: 77 %
Platelets: 211 10*3/uL (ref 150–400)
RBC: 3.35 MIL/uL — ABNORMAL LOW (ref 3.87–5.11)
RDW: 19 % — ABNORMAL HIGH (ref 11.5–15.5)
WBC: 14.8 10*3/uL — ABNORMAL HIGH (ref 4.0–10.5)
nRBC: 0 % (ref 0.0–0.2)

## 2023-09-11 MED ORDER — SENNOSIDES-DOCUSATE SODIUM 8.6-50 MG PO TABS: 1.0000 | ORAL_TABLET | Freq: Every evening | ORAL | Status: DC | PRN

## 2023-09-11 MED ORDER — ONDANSETRON HCL 4 MG/2ML IJ SOLN: 4.0000 mg | Freq: Four times a day (QID) | INTRAMUSCULAR | Status: DC | PRN

## 2023-09-11 MED ORDER — AMLODIPINE BESYLATE 10 MG PO TABS
10.0000 mg | ORAL_TABLET | Freq: Every day | ORAL | Status: DC
  Administered 2023-09-11 – 2023-09-13 (×3): 10 mg via ORAL
  Filled 2023-09-11 (×2): qty 1
  Filled 2023-09-11: qty 2

## 2023-09-11 MED ORDER — SEVELAMER CARBONATE 800 MG PO TABS
2400.0000 mg | ORAL_TABLET | Freq: Three times a day (TID) | ORAL | Status: DC
  Administered 2023-09-12 – 2023-09-13 (×4): 2400 mg via ORAL
  Filled 2023-09-11 (×4): qty 3

## 2023-09-11 MED ORDER — ACETAMINOPHEN 650 MG RE SUPP: 650.0000 mg | Freq: Four times a day (QID) | RECTAL | Status: DC | PRN

## 2023-09-11 MED ORDER — SODIUM BICARBONATE 650 MG PO TABS
650.0000 mg | ORAL_TABLET | Freq: Two times a day (BID) | ORAL | Status: DC
Start: 2023-09-11 — End: 2023-09-13
  Administered 2023-09-11 – 2023-09-13 (×4): 650 mg via ORAL
  Filled 2023-09-11 (×4): qty 1

## 2023-09-11 MED ORDER — LOSARTAN POTASSIUM 50 MG PO TABS
50.0000 mg | ORAL_TABLET | Freq: Every day | ORAL | Status: DC
  Administered 2023-09-11 – 2023-09-13 (×3): 50 mg via ORAL
  Filled 2023-09-11 (×3): qty 1

## 2023-09-11 MED ORDER — HEPARIN SODIUM (PORCINE) 5000 UNIT/ML IJ SOLN
5000.0000 [IU] | Freq: Three times a day (TID) | INTRAMUSCULAR | Status: DC
  Administered 2023-09-11 – 2023-09-12 (×5): 5000 [IU] via SUBCUTANEOUS
  Filled 2023-09-11 (×7): qty 1

## 2023-09-11 MED ORDER — CALCITRIOL 0.25 MCG PO CAPS
0.2500 ug | ORAL_CAPSULE | ORAL | Status: DC
  Filled 2023-09-11: qty 1

## 2023-09-11 MED ORDER — HYDROMORPHONE HCL 1 MG/ML IJ SOLN
1.0000 mg | Freq: Four times a day (QID) | INTRAMUSCULAR | Status: DC | PRN
  Administered 2023-09-11 – 2023-09-13 (×6): 1 mg via INTRAVENOUS
  Filled 2023-09-11 (×7): qty 1

## 2023-09-11 MED ORDER — TRETINOIN 0.025 % EX CREA: 1.0000 | TOPICAL_CREAM | CUTANEOUS | Status: DC

## 2023-09-11 MED ORDER — FUROSEMIDE 40 MG PO TABS
80.0000 mg | ORAL_TABLET | Freq: Three times a day (TID) | ORAL | Status: DC
  Administered 2023-09-11 – 2023-09-13 (×6): 80 mg via ORAL
  Filled 2023-09-11 (×3): qty 2
  Filled 2023-09-11: qty 4
  Filled 2023-09-11 (×2): qty 2

## 2023-09-11 MED ORDER — HYDROMORPHONE HCL 1 MG/ML IJ SOLN
0.5000 mg | Freq: Four times a day (QID) | INTRAMUSCULAR | Status: DC | PRN
  Administered 2023-09-11: 0.5 mg via INTRAVENOUS
  Filled 2023-09-11: qty 1

## 2023-09-11 MED ORDER — ONDANSETRON HCL 4 MG PO TABS: 4.0000 mg | ORAL_TABLET | Freq: Four times a day (QID) | ORAL | Status: DC | PRN

## 2023-09-11 MED ORDER — CHLORHEXIDINE GLUCONATE CLOTH 2 % EX PADS
6.0000 | MEDICATED_PAD | Freq: Every day | CUTANEOUS | Status: DC
  Administered 2023-09-12 – 2023-09-13 (×2): 6 via TOPICAL

## 2023-09-11 MED ORDER — PANTOPRAZOLE SODIUM 40 MG PO TBEC: 40.0000 mg | DELAYED_RELEASE_TABLET | Freq: Every day | ORAL | Status: DC | PRN

## 2023-09-11 MED ORDER — LIDOCAINE 5 % EX PTCH
1.0000 | MEDICATED_PATCH | CUTANEOUS | Status: DC
  Filled 2023-09-11: qty 1

## 2023-09-11 MED ORDER — METHOCARBAMOL 1000 MG/10ML IJ SOLN: 500.0000 mg | Freq: Three times a day (TID) | INTRAMUSCULAR | Status: DC | PRN

## 2023-09-11 MED ORDER — CARVEDILOL 25 MG PO TABS
25.0000 mg | ORAL_TABLET | Freq: Two times a day (BID) | ORAL | Status: DC
  Administered 2023-09-11 – 2023-09-13 (×4): 25 mg via ORAL
  Filled 2023-09-11: qty 1
  Filled 2023-09-11: qty 2
  Filled 2023-09-11 (×2): qty 1

## 2023-09-11 MED ORDER — MORPHINE SULFATE (PF) 2 MG/ML IV SOLN
2.0000 mg | Freq: Once | INTRAVENOUS | Status: AC
  Administered 2023-09-11: 2 mg via INTRAVENOUS
  Filled 2023-09-11: qty 1

## 2023-09-11 MED ORDER — SEVELAMER CARBONATE 2.4 G PO PACK: 2.4000 g | PACK | ORAL | Status: DC

## 2023-09-11 MED ORDER — ACETAMINOPHEN 325 MG PO TABS
650.0000 mg | ORAL_TABLET | Freq: Four times a day (QID) | ORAL | Status: DC | PRN
  Administered 2023-09-13: 650 mg via ORAL
  Filled 2023-09-11: qty 2
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
## 2023-09-11 NOTE — ED Notes (Signed)
 Patient transported to X-ray
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
## 2023-09-11 NOTE — ED Notes (Signed)
 Ambulated pt with pulse ox, sats fluctuating around 96 on room air while walking
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
## 2023-09-11 NOTE — ED Notes (Signed)
 RN called dialysis to tell them pt was coming, they said pt had to wait.  RN then called [facility] to inform that the pt would be coming now.  Also let them know about dialysis.

## 2023-09-12 ENCOUNTER — Observation Stay (HOSPITAL_COMMUNITY): Payer: 59

## 2023-09-12 DIAGNOSIS — Z992 Dependence on renal dialysis: Secondary | ICD-10-CM

## 2023-09-12 DIAGNOSIS — E8779 Other fluid overload: Secondary | ICD-10-CM | POA: Diagnosis not present

## 2023-09-12 DIAGNOSIS — I132 Hypertensive heart and chronic kidney disease with heart failure and with stage 5 chronic kidney disease, or end stage renal disease: Secondary | ICD-10-CM | POA: Diagnosis present

## 2023-09-12 DIAGNOSIS — Z8269 Family history of other diseases of the musculoskeletal system and connective tissue: Secondary | ICD-10-CM | POA: Diagnosis not present

## 2023-09-12 DIAGNOSIS — I5043 Acute on chronic combined systolic (congestive) and diastolic (congestive) heart failure: Secondary | ICD-10-CM | POA: Diagnosis present

## 2023-09-12 DIAGNOSIS — I5033 Acute on chronic diastolic (congestive) heart failure: Secondary | ICD-10-CM | POA: Diagnosis not present

## 2023-09-12 DIAGNOSIS — E875 Hyperkalemia: Secondary | ICD-10-CM | POA: Diagnosis present

## 2023-09-12 DIAGNOSIS — D631 Anemia in chronic kidney disease: Secondary | ICD-10-CM | POA: Diagnosis present

## 2023-09-12 DIAGNOSIS — Z79899 Other long term (current) drug therapy: Secondary | ICD-10-CM | POA: Diagnosis not present

## 2023-09-12 DIAGNOSIS — K219 Gastro-esophageal reflux disease without esophagitis: Secondary | ICD-10-CM | POA: Diagnosis present

## 2023-09-12 DIAGNOSIS — Z841 Family history of disorders of kidney and ureter: Secondary | ICD-10-CM | POA: Diagnosis not present

## 2023-09-12 DIAGNOSIS — N2581 Secondary hyperparathyroidism of renal origin: Secondary | ICD-10-CM | POA: Diagnosis present

## 2023-09-12 DIAGNOSIS — R0602 Shortness of breath: Secondary | ICD-10-CM | POA: Diagnosis present

## 2023-09-12 DIAGNOSIS — N186 End stage renal disease: Secondary | ICD-10-CM | POA: Diagnosis present

## 2023-09-12 DIAGNOSIS — Z1152 Encounter for screening for COVID-19: Secondary | ICD-10-CM | POA: Diagnosis not present

## 2023-09-12 DIAGNOSIS — Z833 Family history of diabetes mellitus: Secondary | ICD-10-CM | POA: Diagnosis not present

## 2023-09-12 DIAGNOSIS — Z888 Allergy status to other drugs, medicaments and biological substances status: Secondary | ICD-10-CM | POA: Diagnosis not present

## 2023-09-12 DIAGNOSIS — I120 Hypertensive chronic kidney disease with stage 5 chronic kidney disease or end stage renal disease: Secondary | ICD-10-CM | POA: Diagnosis not present

## 2023-09-12 DIAGNOSIS — Z885 Allergy status to narcotic agent status: Secondary | ICD-10-CM | POA: Diagnosis not present

## 2023-09-12 DIAGNOSIS — M311 Thrombotic microangiopathy, unspecified: Secondary | ICD-10-CM | POA: Diagnosis present

## 2023-09-12 DIAGNOSIS — J810 Acute pulmonary edema: Secondary | ICD-10-CM | POA: Diagnosis not present

## 2023-09-12 DIAGNOSIS — N250 Renal osteodystrophy: Secondary | ICD-10-CM | POA: Diagnosis not present

## 2023-09-12 DIAGNOSIS — Z8249 Family history of ischemic heart disease and other diseases of the circulatory system: Secondary | ICD-10-CM | POA: Diagnosis not present

## 2023-09-12 DIAGNOSIS — J9601 Acute respiratory failure with hypoxia: Secondary | ICD-10-CM | POA: Diagnosis present

## 2023-09-12 DIAGNOSIS — F17290 Nicotine dependence, other tobacco product, uncomplicated: Secondary | ICD-10-CM | POA: Diagnosis present

## 2023-09-12 DIAGNOSIS — M329 Systemic lupus erythematosus, unspecified: Secondary | ICD-10-CM | POA: Diagnosis present

## 2023-09-12 LAB — ECHOCARDIOGRAM COMPLETE
AR max vel: 2.12 cm2
AV Area VTI: 2.38 cm2
AV Area mean vel: 2.21 cm2
AV Mean grad: 5 mm[Hg]
AV Peak grad: 8.2 mm[Hg]
Ao pk vel: 1.43 m/s
Area-P 1/2: 5.54 cm2
Calc EF: 37.6 %
Est EF: 55
Height: 66 in
MV VTI: 1.99 cm2
S' Lateral: 3.6 cm
Single Plane A2C EF: 38.6 %
Single Plane A4C EF: 38.4 %

## 2023-09-12 LAB — BASIC METABOLIC PANEL
Anion gap: 12 (ref 5–15)
BUN: 26 mg/dL — ABNORMAL HIGH (ref 6–20)
CO2: 27 mmol/L (ref 22–32)
Calcium: 8.7 mg/dL — ABNORMAL LOW (ref 8.9–10.3)
Chloride: 97 mmol/L — ABNORMAL LOW (ref 98–111)
Creatinine, Ser: 6.03 mg/dL — ABNORMAL HIGH (ref 0.44–1.00)
GFR, Estimated: 9 mL/min — ABNORMAL LOW (ref >60)
Glucose, Bld: 91 mg/dL (ref 70–99)
Potassium: 4.6 mmol/L (ref 3.5–5.1)
Sodium: 136 mmol/L (ref 135–145)

## 2023-09-12 LAB — CBC
HCT: 32.3 % — ABNORMAL LOW (ref 36.0–46.0)
Hemoglobin: 10.3 g/dL — ABNORMAL LOW (ref 12.0–15.0)
MCH: 31.4 pg (ref 26.0–34.0)
MCHC: 31.9 g/dL (ref 30.0–36.0)
MCV: 98.5 fL (ref 80.0–100.0)
Platelets: 179 10*3/uL (ref 150–400)
RBC: 3.28 MIL/uL — ABNORMAL LOW (ref 3.87–5.11)
RDW: 18.8 % — ABNORMAL HIGH (ref 11.5–15.5)
WBC: 11.2 10*3/uL — ABNORMAL HIGH (ref 4.0–10.5)
nRBC: 0 % (ref 0.0–0.2)

## 2023-09-12 MED ORDER — CHLORHEXIDINE GLUCONATE CLOTH 2 % EX PADS
6.0000 | MEDICATED_PAD | Freq: Every day | CUTANEOUS | Status: DC
  Administered 2023-09-13: 6 via TOPICAL

## 2023-09-12 NOTE — Plan of Care (Signed)
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
## 2023-09-12 NOTE — Progress Notes (Addendum)
 [facility] Progress Note  Subjective: feeling better, some cramping post HD. SOB better.   Vitals:   09/12/23 0433 09/12/23 0503 09/12/23 0533 09/12/23 0814  BP: (!) 163/114 (!) 162/110 (!) 152/102 (!) 157/115  Pulse: 89 94 (!) 109 92  Resp: (!) 27 (!) 23 (!) 29 18  Temp:    98.4 F (36.9 C)  TempSrc:    Oral  SpO2: 99% 100% 98% 97%  Height:        Exam: Gen alert, no distress, [place] O2  No jvd or bruits Chest crackles 1/3 bilat bases RRR no MRG Abd soft ntnd no mass or ascites +bs Ext 1+ pretib edema Neuro is alert, Ox 3 , nf    LUA AVF+bruit     Renal-related home meds: - rocaltrol  0.25 mc daily - norvasc  10/ coreg  25 bid/ lasix  80 tid/ losartan  50 every day - renvela  2.4 gm ac tid - sod bicarb 650 bid - others: PPI, oxy IR, narcan, zanaflex       OP HD: [place] [facility] 3.5h   350/500   73.9kg   2/2 bath  LUA AVF   Heparin  none  - details pending      Assessment/ Plan: SOB/ acute hypoxic resp failure/ vol overload - sig pulm edema by CXR new O2 requirement in ED. 3.5 L off last night, standing wt this am is 71.9kg, 2kg under her dry wt. Need ambulatory SpO2. Addendum: pt failed ambulatory SpO2 test. Will put her on for 1st shift HD Monday and cont to lower volume/ dry wt.  ESRD - on HD MWF. No missed HD. HD tomorrow.  HTN - very high BP's here initially, still are a bit high. Cont home meds per pmd.  Anemia of esrd - Hb 10-11 here. Follow.  Secondary hyperparathyroidism - CCa and phos are in range. Cont binders w/ meals and po vdra.     Myer Fret MD  [facility] 09/12/2023, 10:48 AM  Recent Labs  Lab 09/11/23 1223 09/11/23 1601  HGB 10.3* 10.7*  ALBUMIN  3.6  --   CALCIUM  8.9  --   PHOS 5.4*  --   CREATININE 9.34* 9.59*  K 5.5*  --    No results for input(s): IRON , TIBC, FERRITIN in the last 168 hours. Inpatient medications:  amLODipine   10 mg Oral Daily   [START ON 09/13/2023] calcitRIOL   0.25 mcg Oral Q M,W,F-1800   carvedilol   25 mg Oral BID  WC   Chlorhexidine  Gluconate Cloth  6 each Topical Q0600   furosemide   80 mg Oral TID   heparin   5,000 Units Subcutaneous Q8H   lidocaine   1 patch Transdermal Q24H   losartan   50 mg Oral Daily   sevelamer  carbonate  2,400 mg Oral TID WC   sodium bicarbonate   650 mg Oral BID    acetaminophen  **OR** acetaminophen , HYDROmorphone  (DILAUDID ) injection, methocarbamol  (ROBAXIN ) injection, ondansetron  **OR** ondansetron  (ZOFRAN ) IV, pantoprazole , senna-docusate

## 2023-09-12 NOTE — Progress Notes (Signed)
  Echocardiogram 2D Echocardiogram has been performed.  Ocie Doyne RDCS 09/12/2023, 3:27 PM

## 2023-09-12 NOTE — Progress Notes (Signed)
 Oxygen assessment done ; saturation dropped to 85% while ambulating in hallway, patient endorses mild Shortness of breath ; saturation picked up to 95% on nasal cannula at 3 LPM ; notified MD Theadora Rama

## 2023-09-13 DIAGNOSIS — I5033 Acute on chronic diastolic (congestive) heart failure: Secondary | ICD-10-CM | POA: Diagnosis not present

## 2023-09-13 DIAGNOSIS — N186 End stage renal disease: Secondary | ICD-10-CM | POA: Diagnosis not present

## 2023-09-13 DIAGNOSIS — Z992 Dependence on renal dialysis: Secondary | ICD-10-CM | POA: Diagnosis not present

## 2023-09-13 DIAGNOSIS — J9601 Acute respiratory failure with hypoxia: Secondary | ICD-10-CM | POA: Diagnosis not present

## 2023-09-13 MED ORDER — HYDRALAZINE HCL 20 MG/ML IJ SOLN: 10.0000 mg | Freq: Four times a day (QID) | INTRAMUSCULAR | Status: DC | PRN

## 2023-09-13 MED ORDER — PANTOPRAZOLE SODIUM 40 MG PO TBEC: 40.0000 mg | DELAYED_RELEASE_TABLET | Freq: Every day | ORAL | Status: DC | PRN

## 2023-09-13 MED ORDER — ACETAMINOPHEN 325 MG PO TABS: 650.0000 mg | ORAL_TABLET | Freq: Four times a day (QID) | ORAL | Status: DC | PRN
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
## 2023-09-13 NOTE — Plan of Care (Signed)

## 2023-09-13 NOTE — Progress Notes (Signed)
 Ambulated with pt down the hall on room air. O2 sat stayed between 98-100%.

## 2023-09-13 NOTE — Procedures (Signed)
 I was present at this dialysis session. I have reviewed the session itself and made appropriate changes.   2K bath using LUE AVF.  Goal UF 3L.  Hopefully resp status improved and potential DC later today post HD.   Filed Weights   09/13/23 0357 09/13/23 0400 09/13/23 0801  Weight: 81.4 kg 81.4 kg 71 kg    Recent Labs  Lab 09/11/23 1223 09/11/23 1601 09/12/23 1042  NA 140  --  136  K 5.5*  --  4.6  CL 104  --  97*  CO2 23  --  27  GLUCOSE 87  --  91  BUN 50*  --  26*  CREATININE 9.34*   < > 6.03*  CALCIUM  8.9  --  8.7*  PHOS 5.4*  --   --    < > = values in this interval not displayed.    Recent Labs  Lab 09/11/23 1223 09/11/23 1601 09/12/23 1042  WBC 14.8* 14.9* 11.2*  NEUTROABS 11.5*  --   --   HGB 10.3* 10.7* 10.3*  HCT 33.8* 34.5* 32.3*  MCV 100.9* 100.3* 98.5  PLT 211 207 179    Scheduled Meds:  amLODipine   10 mg Oral Daily   calcitRIOL   0.25 mcg Oral Q M,W,F-1800   carvedilol   25 mg Oral BID WC   Chlorhexidine  Gluconate Cloth  6 each Topical Q0600   Chlorhexidine  Gluconate Cloth  6 each Topical Q0600   furosemide   80 mg Oral TID   heparin   5,000 Units Subcutaneous Q8H   lidocaine   1 patch Transdermal Q24H   losartan   50 mg Oral Daily   sevelamer  carbonate  2,400 mg Oral TID WC   sodium bicarbonate   650 mg Oral BID   Continuous Infusions: PRN Meds:.acetaminophen  **OR** acetaminophen , hydrALAZINE , HYDROmorphone  (DILAUDID ) injection, methocarbamol  (ROBAXIN ) injection, ondansetron  **OR** ondansetron  (ZOFRAN ) IV, pantoprazole , senna-docusate   Bernardino Gasman  MD 09/13/2023, 10:06 AM

## 2023-09-13 NOTE — Discharge Planning (Signed)
 [facility] Patient Discharge Orders- [facility]: [facility]  Patient's name: Linda [name] Admit/DC Dates: 09/11/2023 - 09/13/2023  Discharge Diagnoses: Acute/chronic CHF/volume overload   Acute hypoxic respiratory failure   Recent Labs  Lab 09/11/23 1223 09/11/23 1601 09/12/23 1042  HGB 10.3*   < > 10.3*  K 5.5*  --  4.6  CALCIUM  8.9  --  8.7*  PHOS 5.4*  --   --   ALBUMIN  3.6  --   --    < > = values in this interval not displayed.   Aranesp : Given: --   Date of last dose/amount: --   PRBC's Given: -- Date/# of units: -- Mircera: No change   Outpatient Dialysis Orders:  -Heparin : No change  -EDW: Lower to 71 kg  -Bath: No change   Access intervention/Change: --  IV Antibiotics:  --  Anticoagulation --    OTHER/APPTS    Completed by: Maisie Ronnald Acosta PA-C   D/C Meds to be reconciled by nurse after every discharge.    Reviewed by: MD:______ RN_______

## 2023-09-13 NOTE — Discharge Instructions (Signed)
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
## 2023-09-13 NOTE — Progress Notes (Signed)
   09/13/23 1213  Vitals  Pulse Rate 78  Resp 15  BP (!) 142/100  SpO2 100 %  O2 Device Nasal Cannula  Oxygen Therapy  O2 Flow Rate (L/min) 2 L/min  Patient Activity (if Appropriate) In bed  Pulse Oximetry Type Continuous  Post Treatment  Dialyzer Clearance Lightly streaked  Liters Processed 78  Fluid Removed (mL) 3000 mL  Tolerated HD Treatment Yes  AVG/AVF Arterial Site Held (minutes) 10 minutes  AVG/AVF Venous Site Held (minutes) 10 minutes   Received patient in bed to unit.  Alert and oriented.  Informed consent signed and in chart.   TX duration:3.25  Patient tolerated well.  Transported back to the room  Alert, without acute distress.  Hand-off given to patient's nurse.   Access used: L AVF Access issues: None  Total UF removed: Medication(s) given: See MAR    Geni Seip, RN [facility]

## 2023-09-20 DIAGNOSIS — N186 End stage renal disease: Secondary | ICD-10-CM | POA: Diagnosis not present

## 2023-09-20 DIAGNOSIS — D509 Iron deficiency anemia, unspecified: Secondary | ICD-10-CM | POA: Diagnosis not present

## 2023-09-20 DIAGNOSIS — Z992 Dependence on renal dialysis: Secondary | ICD-10-CM | POA: Diagnosis not present

## 2023-09-20 DIAGNOSIS — N2581 Secondary hyperparathyroidism of renal origin: Secondary | ICD-10-CM | POA: Diagnosis not present

## 2023-09-20 DIAGNOSIS — D631 Anemia in chronic kidney disease: Secondary | ICD-10-CM | POA: Diagnosis not present

## 2023-09-21 DIAGNOSIS — M8589 Other specified disorders of bone density and structure, multiple sites: Secondary | ICD-10-CM | POA: Diagnosis not present

## 2023-09-21 DIAGNOSIS — Z1231 Encounter for screening mammogram for malignant neoplasm of breast: Secondary | ICD-10-CM | POA: Diagnosis not present

## 2023-09-21 DIAGNOSIS — Z780 Asymptomatic menopausal state: Secondary | ICD-10-CM | POA: Diagnosis not present

## 2023-09-23 ENCOUNTER — Other Ambulatory Visit: Payer: Self-pay

## 2023-09-23 ENCOUNTER — Emergency Department (HOSPITAL_COMMUNITY)
Admission: EM | Admit: 2023-09-23 | Discharge: 2023-09-23 | Disposition: A | Discharge status: Home / Self Care | Payer: 59 | Attending: Emergency Medicine | Admitting: Emergency Medicine

## 2023-09-23 ENCOUNTER — Emergency Department (HOSPITAL_COMMUNITY): Payer: 59

## 2023-09-23 ENCOUNTER — Encounter (HOSPITAL_COMMUNITY): Payer: Self-pay | Admitting: *Deleted

## 2023-09-23 DIAGNOSIS — I120 Hypertensive chronic kidney disease with stage 5 chronic kidney disease or end stage renal disease: Secondary | ICD-10-CM | POA: Insufficient documentation

## 2023-09-23 DIAGNOSIS — Z79899 Other long term (current) drug therapy: Secondary | ICD-10-CM | POA: Diagnosis not present

## 2023-09-23 DIAGNOSIS — M4319 Spondylolisthesis, multiple sites in spine: Secondary | ICD-10-CM | POA: Diagnosis not present

## 2023-09-23 DIAGNOSIS — R0602 Shortness of breath: Secondary | ICD-10-CM | POA: Insufficient documentation

## 2023-09-23 DIAGNOSIS — Z992 Dependence on renal dialysis: Secondary | ICD-10-CM | POA: Insufficient documentation

## 2023-09-23 DIAGNOSIS — N186 End stage renal disease: Secondary | ICD-10-CM | POA: Diagnosis not present

## 2023-09-23 DIAGNOSIS — Y92410 Unspecified street and highway as the place of occurrence of the external cause: Secondary | ICD-10-CM | POA: Insufficient documentation

## 2023-09-23 DIAGNOSIS — M545 Low back pain, unspecified: Secondary | ICD-10-CM | POA: Insufficient documentation

## 2023-09-23 DIAGNOSIS — M549 Dorsalgia, unspecified: Secondary | ICD-10-CM | POA: Diagnosis not present

## 2023-09-23 LAB — PREGNANCY, URINE: Preg Test, Ur: NEGATIVE

## 2023-09-23 MED ORDER — OXYCODONE-ACETAMINOPHEN 5-325 MG PO TABS: 1.0000 | ORAL_TABLET | Freq: Once | ORAL | Status: DC

## 2023-09-23 MED ORDER — OXYCODONE HCL 5 MG PO TABS
15.0000 mg | ORAL_TABLET | Freq: Once | ORAL | Status: AC
  Administered 2023-09-23: 15 mg via ORAL
  Filled 2023-09-23: qty 3

## 2023-09-23 MED ORDER — CYCLOBENZAPRINE HCL 10 MG PO TABS
5.0000 mg | ORAL_TABLET | Freq: Once | ORAL | Status: AC
  Administered 2023-09-23: 5 mg via ORAL
  Filled 2023-09-23: qty 1

## 2023-09-23 MED ORDER — METHOCARBAMOL 500 MG PO TABS
500.0000 mg | ORAL_TABLET | Freq: Once | ORAL | Status: AC
  Administered 2023-09-23: 500 mg via ORAL
  Filled 2023-09-23: qty 1

## 2023-09-23 MED ORDER — DICLOFENAC SODIUM 1 % EX GEL: 2.0000 g | Freq: Four times a day (QID) | CUTANEOUS | 0 refills | Status: DC

## 2023-09-23 MED ORDER — PREDNISONE 20 MG PO TABS
60.0000 mg | ORAL_TABLET | Freq: Once | ORAL | Status: AC
  Administered 2023-09-23: 60 mg via ORAL
  Filled 2023-09-23: qty 3

## 2023-09-23 MED ORDER — METHOCARBAMOL 500 MG PO TABS: 500.0000 mg | ORAL_TABLET | Freq: Three times a day (TID) | ORAL | 0 refills | Status: DC | PRN

## 2023-09-23 NOTE — ED Provider Notes (Signed)
Blood pressure (!) 139/117, pulse 92, temperature 98.3 F (36.8 C), resp. rate 16, height 5\' 6"  (1.676 m), weight 68.9 kg, SpO2 94%.  Assuming care from Dr. Preston Fleeting.  In short, Linda [name] is a 28 y.o. female with a chief complaint of Back Pain .  Refer to the original H&P for additional details.  The current plan of care is to follow up on spine XR.  10:00 AM  Spine XR negative for acute findings. Plan for Robaxin at home, early mobility to reduce stiffness, and outpatient HD. Patient in agreement with plan. Pain improved at the time of discharge.    [name], MD 09/23/23 1001

## 2023-09-23 NOTE — ED Triage Notes (Signed)
The pt is c/o back pain  for 2 days and she has pain in her legs and she has had some rt hand numbness but that went away  lmp none

## 2023-09-23 NOTE — Discharge Instructions (Signed)
We believe that your symptoms are caused by musculoskeletal strain.  Please read through the included information about additional care such as heating pads, over-the-counter pain medicine.  If you were provided a prescription please use it only as needed and as instructed.  Remember that early mobility and using the affected part of your body is actually better than keeping it immobile.  Please call your dialysis to arrange your next dialysis session.   Follow-up with the doctor listed as recommended or return to the emergency department with new or worsening symptoms that concern you.
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
## 2023-09-24 DIAGNOSIS — D509 Iron deficiency anemia, unspecified: Secondary | ICD-10-CM | POA: Diagnosis not present

## 2023-09-24 DIAGNOSIS — N2581 Secondary hyperparathyroidism of renal origin: Secondary | ICD-10-CM | POA: Diagnosis not present

## 2023-09-24 DIAGNOSIS — Z992 Dependence on renal dialysis: Secondary | ICD-10-CM | POA: Diagnosis not present

## 2023-09-24 DIAGNOSIS — D631 Anemia in chronic kidney disease: Secondary | ICD-10-CM | POA: Diagnosis not present

## 2023-09-24 DIAGNOSIS — N186 End stage renal disease: Secondary | ICD-10-CM | POA: Diagnosis not present

## 2023-10-01 DIAGNOSIS — D631 Anemia in chronic kidney disease: Secondary | ICD-10-CM | POA: Diagnosis not present

## 2023-10-01 DIAGNOSIS — N2581 Secondary hyperparathyroidism of renal origin: Secondary | ICD-10-CM | POA: Diagnosis not present

## 2023-10-01 DIAGNOSIS — D509 Iron deficiency anemia, unspecified: Secondary | ICD-10-CM | POA: Diagnosis not present

## 2023-10-01 DIAGNOSIS — N186 End stage renal disease: Secondary | ICD-10-CM | POA: Diagnosis not present

## 2023-10-01 DIAGNOSIS — Z992 Dependence on renal dialysis: Secondary | ICD-10-CM | POA: Diagnosis not present

## 2023-10-04 DIAGNOSIS — Z992 Dependence on renal dialysis: Secondary | ICD-10-CM | POA: Diagnosis not present

## 2023-10-04 DIAGNOSIS — D631 Anemia in chronic kidney disease: Secondary | ICD-10-CM | POA: Diagnosis not present

## 2023-10-04 DIAGNOSIS — D509 Iron deficiency anemia, unspecified: Secondary | ICD-10-CM | POA: Diagnosis not present

## 2023-10-04 DIAGNOSIS — N186 End stage renal disease: Secondary | ICD-10-CM | POA: Diagnosis not present

## 2023-10-04 DIAGNOSIS — N2581 Secondary hyperparathyroidism of renal origin: Secondary | ICD-10-CM | POA: Diagnosis not present

## 2023-10-06 DIAGNOSIS — D631 Anemia in chronic kidney disease: Secondary | ICD-10-CM | POA: Diagnosis not present

## 2023-10-06 DIAGNOSIS — D509 Iron deficiency anemia, unspecified: Secondary | ICD-10-CM | POA: Diagnosis not present

## 2023-10-06 DIAGNOSIS — N186 End stage renal disease: Secondary | ICD-10-CM | POA: Diagnosis not present

## 2023-10-06 DIAGNOSIS — N2581 Secondary hyperparathyroidism of renal origin: Secondary | ICD-10-CM | POA: Diagnosis not present

## 2023-10-06 DIAGNOSIS — Z992 Dependence on renal dialysis: Secondary | ICD-10-CM | POA: Diagnosis not present

## 2023-10-08 DIAGNOSIS — Z79891 Long term (current) use of opiate analgesic: Secondary | ICD-10-CM | POA: Diagnosis not present

## 2023-10-08 DIAGNOSIS — G894 Chronic pain syndrome: Secondary | ICD-10-CM | POA: Diagnosis not present

## 2023-10-10 ENCOUNTER — Other Ambulatory Visit: Payer: Self-pay

## 2023-10-10 ENCOUNTER — Inpatient Hospital Stay (HOSPITAL_COMMUNITY)
Admission: EM | Admit: 2023-10-10 | Discharge: 2023-10-12 | DRG: 871 | Disposition: A | Discharge status: Home / Self Care | Payer: 59 | Attending: Internal Medicine | Admitting: Internal Medicine

## 2023-10-10 ENCOUNTER — Emergency Department (HOSPITAL_COMMUNITY): Payer: 59

## 2023-10-10 ENCOUNTER — Encounter (HOSPITAL_COMMUNITY): Payer: Self-pay | Admitting: Emergency Medicine

## 2023-10-10 DIAGNOSIS — Z862 Personal history of diseases of the blood and blood-forming organs and certain disorders involving the immune mechanism: Secondary | ICD-10-CM | POA: Diagnosis not present

## 2023-10-10 DIAGNOSIS — R6520 Severe sepsis without septic shock: Secondary | ICD-10-CM | POA: Diagnosis present

## 2023-10-10 DIAGNOSIS — D631 Anemia in chronic kidney disease: Secondary | ICD-10-CM | POA: Diagnosis present

## 2023-10-10 DIAGNOSIS — I5023 Acute on chronic systolic (congestive) heart failure: Secondary | ICD-10-CM

## 2023-10-10 DIAGNOSIS — J811 Chronic pulmonary edema: Secondary | ICD-10-CM | POA: Diagnosis not present

## 2023-10-10 DIAGNOSIS — R0989 Other specified symptoms and signs involving the circulatory and respiratory systems: Secondary | ICD-10-CM | POA: Diagnosis not present

## 2023-10-10 DIAGNOSIS — Z8249 Family history of ischemic heart disease and other diseases of the circulatory system: Secondary | ICD-10-CM

## 2023-10-10 DIAGNOSIS — J189 Pneumonia, unspecified organism: Secondary | ICD-10-CM | POA: Diagnosis present

## 2023-10-10 DIAGNOSIS — Z992 Dependence on renal dialysis: Secondary | ICD-10-CM

## 2023-10-10 DIAGNOSIS — N189 Chronic kidney disease, unspecified: Secondary | ICD-10-CM

## 2023-10-10 DIAGNOSIS — E8729 Other acidosis: Secondary | ICD-10-CM | POA: Diagnosis present

## 2023-10-10 DIAGNOSIS — E872 Acidosis, unspecified: Secondary | ICD-10-CM | POA: Diagnosis present

## 2023-10-10 DIAGNOSIS — I132 Hypertensive heart and chronic kidney disease with heart failure and with stage 5 chronic kidney disease, or end stage renal disease: Secondary | ICD-10-CM | POA: Diagnosis present

## 2023-10-10 DIAGNOSIS — I10 Essential (primary) hypertension: Secondary | ICD-10-CM | POA: Diagnosis not present

## 2023-10-10 DIAGNOSIS — Z833 Family history of diabetes mellitus: Secondary | ICD-10-CM | POA: Diagnosis not present

## 2023-10-10 DIAGNOSIS — R6889 Other general symptoms and signs: Secondary | ICD-10-CM | POA: Diagnosis not present

## 2023-10-10 DIAGNOSIS — K219 Gastro-esophageal reflux disease without esophagitis: Secondary | ICD-10-CM | POA: Diagnosis present

## 2023-10-10 DIAGNOSIS — Z841 Family history of disorders of kidney and ureter: Secondary | ICD-10-CM

## 2023-10-10 DIAGNOSIS — N2581 Secondary hyperparathyroidism of renal origin: Secondary | ICD-10-CM | POA: Diagnosis present

## 2023-10-10 DIAGNOSIS — I517 Cardiomegaly: Secondary | ICD-10-CM | POA: Diagnosis not present

## 2023-10-10 DIAGNOSIS — J9601 Acute respiratory failure with hypoxia: Secondary | ICD-10-CM

## 2023-10-10 DIAGNOSIS — J810 Acute pulmonary edema: Secondary | ICD-10-CM

## 2023-10-10 DIAGNOSIS — I120 Hypertensive chronic kidney disease with stage 5 chronic kidney disease or end stage renal disease: Secondary | ICD-10-CM | POA: Diagnosis not present

## 2023-10-10 DIAGNOSIS — R918 Other nonspecific abnormal finding of lung field: Secondary | ICD-10-CM | POA: Diagnosis not present

## 2023-10-10 DIAGNOSIS — I499 Cardiac arrhythmia, unspecified: Secondary | ICD-10-CM | POA: Diagnosis not present

## 2023-10-10 DIAGNOSIS — N186 End stage renal disease: Secondary | ICD-10-CM | POA: Diagnosis present

## 2023-10-10 DIAGNOSIS — Z8269 Family history of other diseases of the musculoskeletal system and connective tissue: Secondary | ICD-10-CM | POA: Diagnosis not present

## 2023-10-10 DIAGNOSIS — A419 Sepsis, unspecified organism: Principal | ICD-10-CM | POA: Diagnosis present

## 2023-10-10 DIAGNOSIS — R0902 Hypoxemia: Secondary | ICD-10-CM | POA: Diagnosis not present

## 2023-10-10 DIAGNOSIS — Z91158 Patient's noncompliance with renal dialysis for other reason: Secondary | ICD-10-CM | POA: Diagnosis not present

## 2023-10-10 DIAGNOSIS — J984 Other disorders of lung: Secondary | ICD-10-CM | POA: Diagnosis not present

## 2023-10-10 DIAGNOSIS — Z79899 Other long term (current) drug therapy: Secondary | ICD-10-CM

## 2023-10-10 DIAGNOSIS — Z885 Allergy status to narcotic agent status: Secondary | ICD-10-CM | POA: Diagnosis not present

## 2023-10-10 DIAGNOSIS — I5033 Acute on chronic diastolic (congestive) heart failure: Secondary | ICD-10-CM | POA: Diagnosis present

## 2023-10-10 DIAGNOSIS — M351 Other overlap syndromes: Secondary | ICD-10-CM | POA: Diagnosis present

## 2023-10-10 DIAGNOSIS — Z1152 Encounter for screening for COVID-19: Secondary | ICD-10-CM

## 2023-10-10 DIAGNOSIS — Z743 Need for continuous supervision: Secondary | ICD-10-CM | POA: Diagnosis not present

## 2023-10-10 DIAGNOSIS — Z888 Allergy status to other drugs, medicaments and biological substances status: Secondary | ICD-10-CM | POA: Diagnosis not present

## 2023-10-10 DIAGNOSIS — R0602 Shortness of breath: Secondary | ICD-10-CM | POA: Diagnosis present

## 2023-10-10 DIAGNOSIS — F17290 Nicotine dependence, other tobacco product, uncomplicated: Secondary | ICD-10-CM | POA: Diagnosis present

## 2023-10-10 HISTORY — DX: Acute respiratory failure with hypoxia: J96.01

## 2023-10-10 HISTORY — DX: Acute on chronic systolic (congestive) heart failure: I50.23

## 2023-10-10 LAB — COMPREHENSIVE METABOLIC PANEL
ALT: 9 U/L (ref 0–44)
AST: 15 U/L (ref 15–41)
Albumin: 3.5 g/dL (ref 3.5–5.0)
Alkaline Phosphatase: 85 U/L (ref 38–126)
Anion gap: 20 — ABNORMAL HIGH (ref 5–15)
BUN: 71 mg/dL — ABNORMAL HIGH (ref 6–20)
CO2: 16 mmol/L — ABNORMAL LOW (ref 22–32)
Calcium: 9.1 mg/dL (ref 8.9–10.3)
Chloride: 100 mmol/L (ref 98–111)
Creatinine, Ser: 11.1 mg/dL — ABNORMAL HIGH (ref 0.44–1.00)
GFR, Estimated: 4 mL/min — ABNORMAL LOW (ref >60)
Glucose, Bld: 94 mg/dL (ref 70–99)
Potassium: 5.1 mmol/L (ref 3.5–5.1)
Sodium: 136 mmol/L (ref 135–145)
Total Bilirubin: 1.2 mg/dL (ref 0.0–1.2)
Total Protein: 7.6 g/dL (ref 6.5–8.1)

## 2023-10-10 LAB — PROTIME-INR
INR: 1.2 (ref 0.8–1.2)
Prothrombin Time: 15.8 s — ABNORMAL HIGH (ref 11.4–15.2)

## 2023-10-10 LAB — I-STAT CHEM 8, ED
BUN: 64 mg/dL — ABNORMAL HIGH (ref 6–20)
Calcium, Ion: 1.03 mmol/L — ABNORMAL LOW (ref 1.15–1.40)
Chloride: 107 mmol/L (ref 98–111)
Creatinine, Ser: 12.5 mg/dL — ABNORMAL HIGH (ref 0.44–1.00)
Glucose, Bld: 93 mg/dL (ref 70–99)
HCT: 34 % — ABNORMAL LOW (ref 36.0–46.0)
Hemoglobin: 11.6 g/dL — ABNORMAL LOW (ref 12.0–15.0)
Potassium: 5.1 mmol/L (ref 3.5–5.1)
Sodium: 136 mmol/L (ref 135–145)
TCO2: 18 mmol/L — ABNORMAL LOW (ref 22–32)

## 2023-10-10 LAB — CBC WITH DIFFERENTIAL/PLATELET
Abs Immature Granulocytes: 0.07 10*3/uL (ref 0.00–0.07)
Basophils Absolute: 0.1 10*3/uL (ref 0.0–0.1)
Basophils Relative: 0 %
Eosinophils Absolute: 0.2 10*3/uL (ref 0.0–0.5)
Eosinophils Relative: 1 %
HCT: 34.2 % — ABNORMAL LOW (ref 36.0–46.0)
Hemoglobin: 10.6 g/dL — ABNORMAL LOW (ref 12.0–15.0)
Immature Granulocytes: 0 %
Lymphocytes Relative: 10 %
Lymphs Abs: 1.7 10*3/uL (ref 0.7–4.0)
MCH: 30.5 pg (ref 26.0–34.0)
MCHC: 31 g/dL (ref 30.0–36.0)
MCV: 98.3 fL (ref 80.0–100.0)
Monocytes Absolute: 1.3 10*3/uL — ABNORMAL HIGH (ref 0.1–1.0)
Monocytes Relative: 8 %
Neutro Abs: 13.7 10*3/uL — ABNORMAL HIGH (ref 1.7–7.7)
Neutrophils Relative %: 81 %
Platelets: 224 10*3/uL (ref 150–400)
RBC: 3.48 MIL/uL — ABNORMAL LOW (ref 3.87–5.11)
RDW: 18.4 % — ABNORMAL HIGH (ref 11.5–15.5)
WBC: 17 10*3/uL — ABNORMAL HIGH (ref 4.0–10.5)
nRBC: 0 % (ref 0.0–0.2)

## 2023-10-10 LAB — RESP PANEL BY RT-PCR (RSV, FLU A&B, COVID)  RVPGX2
Influenza A by PCR: NEGATIVE
Influenza B by PCR: NEGATIVE
Resp Syncytial Virus by PCR: NEGATIVE
SARS Coronavirus 2 by RT PCR: NEGATIVE

## 2023-10-10 LAB — I-STAT CG4 LACTIC ACID, ED
Lactic Acid, Venous: 1.1 mmol/L (ref 0.5–1.9)
Lactic Acid, Venous: 1.6 mmol/L (ref 0.5–1.9)

## 2023-10-10 LAB — MRSA NEXT GEN BY PCR, NASAL: MRSA by PCR Next Gen: NOT DETECTED

## 2023-10-10 LAB — APTT: aPTT: 36 s (ref 24–36)

## 2023-10-10 LAB — PHOSPHORUS: Phosphorus: 6.5 mg/dL — ABNORMAL HIGH (ref 2.5–4.6)

## 2023-10-10 LAB — MAGNESIUM: Magnesium: 2.4 mg/dL (ref 1.7–2.4)

## 2023-10-10 LAB — CBG MONITORING, ED: Glucose-Capillary: 100 mg/dL — ABNORMAL HIGH (ref 70–99)

## 2023-10-10 LAB — HCG, SERUM, QUALITATIVE: Preg, Serum: NEGATIVE

## 2023-10-10 LAB — PROCALCITONIN: Procalcitonin: 0.67 ng/mL

## 2023-10-10 LAB — BRAIN NATRIURETIC PEPTIDE: B Natriuretic Peptide: 4431.1 pg/mL — ABNORMAL HIGH (ref 0.0–100.0)

## 2023-10-10 LAB — HEPATITIS B SURFACE ANTIGEN: Hepatitis B Surface Ag: NONREACTIVE

## 2023-10-10 MED ORDER — SODIUM CHLORIDE 0.9% FLUSH
10.0000 mL | Freq: Two times a day (BID) | INTRAVENOUS | Status: DC
  Administered 2023-10-10 – 2023-10-12 (×5): 10 mL

## 2023-10-10 MED ORDER — CHLORHEXIDINE GLUCONATE CLOTH 2 % EX PADS: 6.0000 | MEDICATED_PAD | Freq: Every day | CUTANEOUS | Status: DC

## 2023-10-10 MED ORDER — FUROSEMIDE 10 MG/ML IJ SOLN
80.0000 mg | Freq: Once | INTRAMUSCULAR | Status: AC
  Administered 2023-10-10: 80 mg via INTRAVENOUS
  Filled 2023-10-10: qty 8

## 2023-10-10 MED ORDER — INFLUENZA VIRUS VACC SPLIT PF (FLUZONE) 0.5 ML IM SUSY: 0.5000 mL | PREFILLED_SYRINGE | INTRAMUSCULAR | Status: DC

## 2023-10-10 MED ORDER — FENTANYL CITRATE PF 50 MCG/ML IJ SOSY
50.0000 ug | PREFILLED_SYRINGE | Freq: Once | INTRAMUSCULAR | Status: AC
  Administered 2023-10-10: 50 ug via INTRAVENOUS
  Filled 2023-10-10: qty 1

## 2023-10-10 MED ORDER — OXYCODONE HCL 5 MG PO TABS
5.0000 mg | ORAL_TABLET | Freq: Once | ORAL | Status: AC
  Administered 2023-10-10: 5 mg via ORAL
  Filled 2023-10-10: qty 1

## 2023-10-10 MED ORDER — FUROSEMIDE 10 MG/ML IJ SOLN
60.0000 mg | Freq: Three times a day (TID) | INTRAMUSCULAR | Status: DC
  Administered 2023-10-10 – 2023-10-12 (×5): 60 mg via INTRAVENOUS
  Filled 2023-10-10 (×5): qty 6

## 2023-10-10 MED ORDER — HYDROMORPHONE HCL 1 MG/ML IJ SOLN
0.5000 mg | Freq: Once | INTRAMUSCULAR | Status: AC
  Administered 2023-10-10: 0.5 mg via INTRAVENOUS
  Filled 2023-10-10: qty 1

## 2023-10-10 MED ORDER — FENTANYL CITRATE PF 50 MCG/ML IJ SOSY
50.0000 ug | PREFILLED_SYRINGE | INTRAMUSCULAR | Status: DC | PRN
  Administered 2023-10-10: 50 ug via INTRAVENOUS
  Filled 2023-10-10: qty 1

## 2023-10-10 MED ORDER — MELATONIN 3 MG PO TABS
3.0000 mg | ORAL_TABLET | Freq: Every evening | ORAL | Status: DC | PRN
  Administered 2023-10-10 – 2023-10-11 (×2): 3 mg via ORAL
  Filled 2023-10-10 (×2): qty 1

## 2023-10-10 MED ORDER — SODIUM CHLORIDE 0.9 % IV SOLN
500.0000 mg | INTRAVENOUS | Status: DC
  Administered 2023-10-10 – 2023-10-11 (×2): 500 mg via INTRAVENOUS
  Filled 2023-10-10 (×2): qty 5

## 2023-10-10 MED ORDER — AMLODIPINE BESYLATE 10 MG PO TABS
10.0000 mg | ORAL_TABLET | Freq: Every day | ORAL | Status: DC
  Administered 2023-10-10 – 2023-10-12 (×3): 10 mg via ORAL
  Filled 2023-10-10 (×2): qty 1
  Filled 2023-10-10: qty 2

## 2023-10-10 MED ORDER — SODIUM CHLORIDE 0.9 % IV SOLN
500.0000 mg | Freq: Once | INTRAVENOUS | Status: AC
  Administered 2023-10-10: 500 mg via INTRAVENOUS
  Filled 2023-10-10: qty 5

## 2023-10-10 MED ORDER — NITROGLYCERIN 2 % TD OINT
1.0000 [in_us] | TOPICAL_OINTMENT | Freq: Once | TRANSDERMAL | Status: AC
  Administered 2023-10-10: 1 [in_us] via TOPICAL
  Filled 2023-10-10: qty 1

## 2023-10-10 MED ORDER — HYDROMORPHONE HCL 1 MG/ML IJ SOLN
0.5000 mg | INTRAMUSCULAR | Status: DC | PRN
  Administered 2023-10-10 – 2023-10-12 (×12): 0.5 mg via INTRAVENOUS
  Filled 2023-10-10: qty 0.5
  Filled 2023-10-10 (×3): qty 1
  Filled 2023-10-10: qty 0.5
  Filled 2023-10-10 (×7): qty 1

## 2023-10-10 MED ORDER — CARVEDILOL 12.5 MG PO TABS
25.0000 mg | ORAL_TABLET | Freq: Once | ORAL | Status: AC
  Administered 2023-10-10: 25 mg via ORAL
  Filled 2023-10-10: qty 2

## 2023-10-10 MED ORDER — ACETAMINOPHEN 325 MG PO TABS
650.0000 mg | ORAL_TABLET | Freq: Once | ORAL | Status: AC
  Administered 2023-10-10: 650 mg via ORAL
  Filled 2023-10-10: qty 2

## 2023-10-10 MED ORDER — CARVEDILOL 25 MG PO TABS
25.0000 mg | ORAL_TABLET | Freq: Two times a day (BID) | ORAL | Status: DC
Start: 2023-10-10 — End: 2023-10-12
  Administered 2023-10-10 – 2023-10-12 (×4): 25 mg via ORAL
  Filled 2023-10-10 (×4): qty 1

## 2023-10-10 MED ORDER — ACETAMINOPHEN 650 MG RE SUPP: 650.0000 mg | Freq: Four times a day (QID) | RECTAL | Status: DC | PRN

## 2023-10-10 MED ORDER — SODIUM CHLORIDE 0.9 % IV SOLN
1.0000 g | INTRAVENOUS | Status: DC
  Administered 2023-10-10 – 2023-10-11 (×2): 1 g via INTRAVENOUS
  Filled 2023-10-10 (×2): qty 10

## 2023-10-10 MED ORDER — ONDANSETRON HCL 4 MG/2ML IJ SOLN
4.0000 mg | Freq: Four times a day (QID) | INTRAMUSCULAR | Status: DC | PRN
  Administered 2023-10-10: 4 mg via INTRAVENOUS
  Filled 2023-10-10: qty 2

## 2023-10-10 MED ORDER — BENZONATATE 100 MG PO CAPS
200.0000 mg | ORAL_CAPSULE | Freq: Three times a day (TID) | ORAL | Status: DC | PRN
Start: 2023-10-10 — End: 2023-10-12

## 2023-10-10 MED ORDER — LOSARTAN POTASSIUM 50 MG PO TABS
50.0000 mg | ORAL_TABLET | Freq: Every day | ORAL | Status: DC
  Administered 2023-10-10 – 2023-10-12 (×3): 50 mg via ORAL
  Filled 2023-10-10 (×3): qty 1

## 2023-10-10 MED ORDER — SODIUM CHLORIDE 0.9% FLUSH
10.0000 mL | INTRAVENOUS | Status: DC | PRN
Start: 2023-10-10 — End: 2023-10-12

## 2023-10-10 MED ORDER — PNEUMOCOCCAL 20-VAL CONJ VACC 0.5 ML IM SUSY
0.5000 mL | PREFILLED_SYRINGE | INTRAMUSCULAR | Status: DC
  Filled 2023-10-10: qty 0.5

## 2023-10-10 MED ORDER — SODIUM CHLORIDE 0.9 % IV SOLN
1.0000 g | Freq: Once | INTRAVENOUS | Status: AC
  Administered 2023-10-10: 1 g via INTRAVENOUS
  Filled 2023-10-10: qty 10

## 2023-10-10 MED ORDER — ACETAMINOPHEN 325 MG PO TABS
650.0000 mg | ORAL_TABLET | Freq: Four times a day (QID) | ORAL | Status: DC | PRN
  Administered 2023-10-12: 650 mg via ORAL
  Filled 2023-10-10: qty 2

## 2023-10-10 MED ORDER — SODIUM BICARBONATE 650 MG PO TABS
650.0000 mg | ORAL_TABLET | Freq: Two times a day (BID) | ORAL | Status: DC
Start: 2023-10-10 — End: 2023-10-12
  Administered 2023-10-10 – 2023-10-12 (×5): 650 mg via ORAL
  Filled 2023-10-10 (×5): qty 1

## 2023-10-10 NOTE — ED Notes (Signed)
 Rn called to room by pt. Pt had removed bipap mask. Pt was diaphoretic reporting nausea and abdominal pain. Pt placed on [place] at 6L . Pt placed on bedpan. Pt had large BM. Pt reports feeling hot . Rn informed MD. RN gave IV Zofran  and after provider order RN gave IV fentanyl . Pt reports improved pain. Pt sitting at edge of bed. Alert and oriented x4.

## 2023-10-10 NOTE — ED Provider Notes (Signed)
 [place] EMERGENCY DEPARTMENT AT [facility] Provider Note  CSN: [id number] Arrival date & time: 10/10/23 0138  Chief Complaint(s) Shortness of Breath  HPI Linda [name] is a 28 y.o. female with a past medical history listed below including lupus, ESRD on dialysis MWF who presents to the emergency department with several days of gradually worsening shortness of breath.  Patient reports missing her dialysis on Friday due to PCP appointment.  Symptoms gradually got worse and more severe prompting a call to EMS who noted patient was satting 90% on room air.  She had diffuse wheezes throughout.  She reported chills and having possible sick contacts around but denies any fevers.  She denies any chest pain but does report lower back pain evaluated on a previous ER encounter.  The history is provided by the patient.    Past Medical History Past Medical History:  Diagnosis Date   Abnormal ECG    a. In setting of LVH w/ repolarization abnormalities; b. 03/2019 MV: No ischemia/infarct. EF 55-65%.   Angioedema    ESRD (end stage renal disease) (HCC)    on HD (M,W,F)   GERD (gastroesophageal reflux disease)    Hypertension    Lupus    LVH (left ventricular hypertrophy)    a. 11/2017 Echo: EF 50-55%. triv AI. Mild MR. Mod L and mild R atrial enlargement. Nl RV size/fxn. Small pericardial effusion w/o tampondae; b. 03/2018 Echo: EF 55-60%, restrictive filling pattern. Nl RV size/fxn. Sev LAE. Mild MR, mild to mod TR/PR. Mod PAH. Triv effusion.   Migraines    Mixed connective tissue disease (HCC)    Previous Dialysis patient [facility])    a. Off HD since late 2020.   Septic prepatellar bursitis of right knee 08/19/2017   Patient Active Problem List   Diagnosis Date Noted   Acute hypoxic respiratory failure (HCC) 10/10/2023   CAP (community acquired pneumonia) 10/10/2023   Acute on chronic diastolic CHF (congestive heart failure) (HCC) 10/10/2023   History of anemia due to chronic kidney  disease 10/10/2023   Pulmonary edema 09/11/2023   Shortness of breath 09/11/2023   Pain in multiple muscles 09/11/2023   Nausea vomiting and diarrhea 09/01/2023   ESRD (end stage renal disease) (HCC) 09/30/2021   Acute pulmonary edema (HCC) 09/30/2021   GERD without esophagitis 09/30/2021   Volume overload 09/02/2021   High anion gap metabolic acidosis 09/02/2021   Macrocytic anemia 09/02/2021   Elevated troponin 09/02/2021   Anxiety 11/03/2019   Strain of sternocleidomastoid muscle 11/03/2019   Difficulty with speech 11/03/2019   Prolonged QT interval 03/02/2019   Chronic heart failure with preserved ejection fraction (HFpEF) (HCC) 03/02/2019   ESRD on dialysis (HCC) 01/10/2019   Calciphylaxis 12/27/2018   Tobacco abuse 03/02/2018   Severe sepsis (HCC) 01/06/2018   Angioedema 01/02/2018   Tachycardia 01/02/2018   Chronic kidney disease, stage 5 (HCC)    Hypertensive urgency 11/29/2017   Grief reaction 08/19/2017   Right knee pain    Bilateral knee swelling 04/20/2017   Systemic lupus erythematosus (HCC)    Essential hypertension 12/11/2016   Mixed connective tissue disease (HCC) 02/11/2016   Implanon  in place 01/19/2014   Home Medication(s) Prior to Admission medications   Medication Sig Start Date End Date Taking? Authorizing Provider  acetaminophen  (TYLENOL ) 325 MG tablet Take 2 tablets (650 mg total) by mouth every 6 (six) hours as needed for mild pain (pain score 1-3) or moderate pain (pain score 4-6) (or Fever >/= 101). 09/13/23  Hongalgi, Anand D, MD  amLODipine  (NORVASC ) 10 MG tablet Take 10 mg by mouth daily. 08/05/21   [provider]  calcitRIOL  (ROCALTROL ) 0.25 MCG capsule Take 1 tablet by mouth daily.    [provider]  carvedilol  (COREG ) 25 MG tablet Take 1 tablet (25 mg total) by mouth 2 (two) times daily with a meal. 09/03/23   Samtani, Jai-Gurmukh, MD  diclofenac  Sodium (VOLTAREN ) 1 % GEL Apply 2 g topically 4 (four) times daily. 09/23/23    Long, Fonda MATSU, MD  furosemide  (LASIX ) 80 MG tablet Take 80 mg by mouth 3 (three) times daily. 07/30/23   [provider]  lidocaine -prilocaine  (EMLA ) cream Apply 1 Application topically every Monday, Wednesday, and Friday. 11/17/21   [provider]  losartan  (COZAAR ) 50 MG tablet Take 1 tablet (50 mg total) by mouth daily. 10/02/21 08/31/24  Dickie Begun, MD  medroxyPROGESTERone  (DEPO-PROVERA ) 150 MG/ML injection Inject 1 mL (150 mg total) into the muscle every 3 (three) months. 10/29/22   Rudy Carlin LABOR, MD  methocarbamol  (ROBAXIN ) 500 MG tablet Take 1 tablet (500 mg total) by mouth every 8 (eight) hours as needed for muscle spasms. 09/23/23   Long, Joshua G, MD  naloxone [facility]) nasal spray 4 mg/0.1 mL Place 1 spray into the nose once. If needed for emergency overdose.    [provider]  pantoprazole  (PROTONIX ) 40 MG tablet Take 1 tablet (40 mg total) by mouth daily as needed (for acid reflux). 09/13/23 10/13/23  Hongalgi, Anand D, MD  RENVELA  2.4 g PACK Take 2.4 g by mouth See admin instructions. Take with a large meal once a day to once every other day. 12/03/22   [provider]  Safety Seal Miscellaneous MISC Use on the alternate nights when you are not applying tretinoin  (Tuesday, Thursday, Saturday, and Sunday) 05/24/23   Alm Delon SAILOR, DO  sodium bicarbonate  650 MG tablet Take 1 tablet (650 mg total) by mouth 2 (two) times daily. 09/05/21   Singh, Prashant K, MD  tretinoin  (RETIN-A ) 0.025 % cream Apply 1 Application topically 3 (three) times a week. 07/05/23   [provider]  Vitamin D, Ergocalciferol, (DRISDOL) 1.25 MG (50000 UNIT) CAPS capsule ergocalciferol (vitamin D2) 1,250 mcg (50,000 unit) capsule TAKE 1 CAPSULE BY MOUTH WEEKLY 03/11/21   [provider]                                                                                                                                    Allergies Lisinopril , Gabapentin, Ativan  [lorazepam ],  Hydroxychloroquine , and Vicodin [hydrocodone -acetaminophen ]  Review of Systems Review of Systems As noted in HPI  Physical Exam Vital Signs  I have reviewed the triage vital signs BP 113/71   Pulse 98   Temp 97.6 F (36.4 C)   Resp (!) 28   Ht 5' 6 (1.676 m)   Wt 79.4 kg   SpO2 98%   BMI 28.25  kg/m   Physical Exam Vitals reviewed.  Constitutional:      General: She is not in acute distress.    Appearance: She is well-developed. She is not diaphoretic.  HENT:     Head: Normocephalic and atraumatic.     Nose: Nose normal.  Eyes:     General: No scleral icterus.       Right eye: No discharge.        Left eye: No discharge.     Conjunctiva/sclera: Conjunctivae normal.     Pupils: Pupils are equal, round, and reactive to light.  Cardiovascular:     Rate and Rhythm: Normal rate and regular rhythm.     Heart sounds: No murmur heard.    No friction rub. No gallop.  Pulmonary:     Effort: Tachypnea and respiratory distress present.     Breath sounds: No stridor. Rales (fine rales throughout) present.  Abdominal:     General: There is no distension.     Palpations: Abdomen is soft.     Tenderness: There is no abdominal tenderness.  Musculoskeletal:        General: No tenderness.     Cervical back: Normal range of motion and neck supple.  Skin:    General: Skin is warm and dry.     Findings: No erythema or rash.  Neurological:     Mental Status: She is alert and oriented to person, place, and time.     ED Results and Treatments Labs (all labs ordered are listed, but only abnormal results are displayed) Labs Reviewed  COMPREHENSIVE METABOLIC PANEL - Abnormal; Notable for the following components:      Result Value   CO2 16 (*)    BUN 71 (*)    Creatinine, Ser 11.10 (*)    GFR, Estimated 4 (*)    Anion gap 20 (*)    All other components within normal limits  CBC WITH DIFFERENTIAL/PLATELET - Abnormal; Notable for the following components:   WBC 17.0 (*)    RBC  3.48 (*)    Hemoglobin 10.6 (*)    HCT 34.2 (*)    RDW 18.4 (*)    Neutro Abs 13.7 (*)    Monocytes Absolute 1.3 (*)    All other components within normal limits  PROTIME-INR - Abnormal; Notable for the following components:   Prothrombin Time 15.8 (*)    All other components within normal limits  PHOSPHORUS - Abnormal; Notable for the following components:   Phosphorus 6.5 (*)    All other components within normal limits  BRAIN NATRIURETIC PEPTIDE - Abnormal; Notable for the following components:   B Natriuretic Peptide 4,431.1 (*)    All other components within normal limits  I-STAT CHEM 8, ED - Abnormal; Notable for the following components:   BUN 64 (*)    Creatinine, Ser 12.50 (*)    Calcium , Ion 1.03 (*)    TCO2 18 (*)    Hemoglobin 11.6 (*)    HCT 34.0 (*)    All other components within normal limits  CBG MONITORING, ED - Abnormal; Notable for the following components:   Glucose-Capillary 100 (*)    All other components within normal limits  RESP PANEL BY RT-PCR (RSV, FLU A&B, COVID)  RVPGX2  CULTURE, BLOOD (ROUTINE X 2)  CULTURE, BLOOD (ROUTINE X 2)  APTT  HCG, SERUM, QUALITATIVE  MAGNESIUM   PROCALCITONIN  URINALYSIS, W/ REFLEX TO CULTURE (INFECTION SUSPECTED)  STREP PNEUMONIAE URINARY ANTIGEN  I-STAT CG4 LACTIC  ACID, ED  I-STAT CG4 LACTIC ACID, ED                                                                                                                         EKG  EKG Interpretation Date/Time:  Sunday October 10 2023 01:47:24 EST Ventricular Rate:  114 PR Interval:  155 QRS Duration:  87 QT Interval:  346 QTC Calculation: 477 R Axis:   35  Text Interpretation: Sinus tachycardia LAE, consider biatrial enlargement Borderline prolonged QT interval Confirmed by Trine Likes [phone]) on 10/10/2023 2:20:23 AM       Radiology DG Chest Port 1 View Result Date: 10/10/2023 CLINICAL DATA:  Questionable sepsis. EXAM: PORTABLE CHEST 1 VIEW COMPARISON:  September 11, 2023 FINDINGS: The cardiac silhouette is moderately enlarged and unchanged in size. There is prominence of the pulmonary vasculature with mild, ill-defined areas of airspace disease seen bilaterally. This is most notable throughout the left lung and right lung base. No pleural effusion or pneumothorax is identified. The visualized skeletal structures are unremarkable. IMPRESSION: 1. Cardiomegaly with mild pulmonary vascular congestion. 2. Mild bilateral airspace disease, which may represent mild pulmonary edema versus multifocal pneumonia. Electronically Signed   By: Suzen Dials M.D.   On: 10/10/2023 03:32    Medications Ordered in ED Medications  acetaminophen  (TYLENOL ) tablet 650 mg (has no administration in time range)    Or  acetaminophen  (TYLENOL ) suppository 650 mg (has no administration in time range)  melatonin tablet 3 mg (has no administration in time range)  ondansetron  (ZOFRAN ) injection 4 mg (4 mg Intravenous Given 10/10/23 0512)  furosemide  (LASIX ) injection 60 mg (has no administration in time range)  azithromycin  (ZITHROMAX ) 500 mg in sodium chloride  0.9 % 250 mL IVPB (has no administration in time range)  cefTRIAXone  (ROCEPHIN ) 1 g in sodium chloride  0.9 % 100 mL IVPB (has no administration in time range)  benzonatate  (TESSALON ) capsule 200 mg (has no administration in time range)  amLODipine  (NORVASC ) tablet 10 mg (10 mg Oral Given 10/10/23 0450)  carvedilol  (COREG ) tablet 25 mg (has no administration in time range)  losartan  (COZAAR ) tablet 50 mg (50 mg Oral Given 10/10/23 0451)  sodium bicarbonate  tablet 650 mg (650 mg Oral Given 10/10/23 0450)  sodium chloride  flush (NS) 0.9 % injection 10-40 mL (has no administration in time range)  sodium chloride  flush (NS) 0.9 % injection 10-40 mL (has no administration in time range)  Chlorhexidine  Gluconate Cloth 2 % PADS 6 each (has no administration in time range)  HYDROmorphone  (DILAUDID ) injection 0.5 mg (has no administration in  time range)  nitroGLYCERIN  (NITROGLYN) 2 % ointment 1 inch (1 inch Topical Given 10/10/23 0204)  cefTRIAXone  (ROCEPHIN ) 1 g in sodium chloride  0.9 % 100 mL IVPB (0 g Intravenous Stopped 10/10/23 0430)  azithromycin  (ZITHROMAX ) 500 mg in sodium chloride  0.9 % 250 mL IVPB (0 mg Intravenous Stopped 10/10/23 0548)  fentaNYL  (SUBLIMAZE ) injection 50 mcg (50 mcg Intravenous Given 10/10/23 0430)  acetaminophen  (  TYLENOL ) tablet 650 mg (650 mg Oral Given 10/10/23 0424)  carvedilol  (COREG ) tablet 25 mg (25 mg Oral Given 10/10/23 0424)  furosemide  (LASIX ) injection 80 mg (80 mg Intravenous Given 10/10/23 0431)  oxyCODONE  (Oxy IR/ROXICODONE ) immediate release tablet 5 mg (5 mg Oral Given 10/10/23 0424)  HYDROmorphone  (DILAUDID ) injection 0.5 mg (0.5 mg Intravenous Given 10/10/23 0732)   Procedures .1-3 Lead EKG Interpretation  Performed by: Trine Raynell Moder, MD Authorized by: Trine Raynell Moder, MD     Interpretation: abnormal     ECG rate:  123   ECG rate assessment: tachycardic     Rhythm: sinus tachycardia     Ectopy: none     Conduction: normal   .Critical Care  Performed by: Trine Raynell Moder, MD Authorized by: Trine Raynell Moder, MD   Critical care provider statement:    Critical care time (minutes):  45   Critical care time was exclusive of:  Separately billable procedures and treating other patients   Critical care was necessary to treat or prevent imminent or life-threatening deterioration of the following conditions:  Respiratory failure   Critical care was time spent personally by me on the following activities:  Development of treatment plan with patient or surrogate, discussions with consultants, evaluation of patient's response to treatment, examination of patient, obtaining history from patient or surrogate, review of old charts, re-evaluation of patient's condition, pulse oximetry, ordering and review of radiographic studies, ordering and review of laboratory studies and ordering  and performing treatments and interventions   Care discussed with: admitting provider     (including critical care time) Medical Decision Making / ED Course   Medical Decision Making Amount and/or Complexity of Data Reviewed Labs: ordered. Decision-making details documented in ED Course. Radiology: ordered and independent interpretation performed. Decision-making details documented in ED Course. ECG/medicine tests: ordered and independent interpretation performed. Decision-making details documented in ED Course.  Risk OTC drugs. Prescription drug management. Decision regarding hospitalization.    Patient presents with respiratory failure. Differential diagnosis of workup considered  Workup is consistent with volume overload from missed dialysis.  HCAP pulmonary edema and hypoxia requiring BiPAP.  Patient also given NTG paste and Lasix . Chest x-ray also concerning for possible pneumonia.  She does have leukocytosis and low-grade temp.  Given empiric antibiotics.  Viral panel negative for COVID, influenza, RSV.  On reassessment, the patient reported feeling much better after being placed on BiPAP. Admitted to hospitalist service, Dr. Marcene. Consulted and spoke with Dr. Geralynn from nephrology who will evaluate for dialysis.    Final Clinical Impression(s) / ED Diagnoses Final diagnoses:  Acute respiratory failure with hypoxia (HCC)  Acute pulmonary edema (HCC)    This chart was dictated using voice recognition software.  Despite best efforts to proofread,  errors can occur which can change the documentation meaning.    Trine Raynell Moder, MD 10/10/23 [phone]

## 2023-10-10 NOTE — ED Notes (Signed)
 Pt transported to dialysis

## 2023-10-10 NOTE — H&P (Signed)
 History and Physical      Linda [name] FMW:[id number] DOB: 10/19/1995 DOA: 10/10/2023; DOS: 10/10/2023  PCP: Pcp, No (will further assess) Patient coming from: home   I have personally briefly reviewed patient's old medical records in [facility]  Chief Complaint: Shortness of breath  HPI: Linda [name] is a 28 y.o. female with medical history significant for end-stage renal disease on hemodialysis on Monday, Wednesday, Friday schedule in the setting of lupus nephritis, chronic diastolic heart failure, send hypertension, anemia of chronic kidney disease associated with baseline hemoglobin 10-12, who is admitted to [facility] on 10/10/2023 with acute hypoxic respiratory failure in the setting of acute on chronic diastolic heart failure after presenting from home to [facility] ED complaining of shortness of breath.   The patient conveys that she missed her most recent scheduled hemodialysis session on Friday, 10/08/2023, as she had a timing conflict with an existing appointment with her PCP on that date.  Over the last day, she has developed progressive shortness of breath associated with new onset cough, and subjective fever.  she notes multiple recent sick contacts as an outpatient.  Denies chills, full body rigors, or generalized myalgias.  Denies any associated rhinitis, rhinorrhea, sore throat.  Denies any recent chest pain, palpitations, diaphoresis, dizziness.  No abdominal pain, diarrhea.   While she does have end-stage renal disease on hemodialysis, she conveys that she does continue to produce urine.  Denies any recent dysuria or gross hematuria.  She confirms no known baseline supplemental oxygen requirements.  Medical history is also notable for chronic diastolic heart failure, with most recent echocardiogram on 09/12/2023 demonstrating LVEF 55%, indeterminate diastolic parameters, normal right ventricular systolic function, severely dilated left atrium, mild mitral  regurgitation.  Preceding echocardiogram in January 2023 demonstrated grade 2 diastolic dysfunction.     ED Course:  Vital signs in the ED were notable for the following: Temperature max 99.8; heart rates in the 1 teens to 120s; soft blood pressures in the 160s to 180s; respiratory rate 17-26, initial oxygen saturation noted to be 88% on room air.  She was subsequently started on BiPAP, with ensuing oxygen saturations noted to be 99 to 100%.  Labs were notable for the following: CMP notable for sodium 136, potassium 5.1, bicarbonate 16, anion gap 20, BUN 71, creatinine 11.1, liver enzymes within normal limits.  Lactic acid 1.1.  CBC notable for open cell count 17,000 with 81% neutrophils compared to most recent prior open cell count of 11,200 on 09/12/2023, hemoglobin 10.6 compared to 10.3 on 09/12/2023.  INR 1.2, PTT 36.  Urinalysis ordered, with results currently pending.  Blood cultures x 2 were collected prior to initiation of IV antibiotics, COVID, flu and sigma RSV PCR all negative.  Per my interpretation, EKG in ED demonstrated the following: Compared to most recent prior EKG from 09/11/2023, presenting EKG shows sinus tachycardia with heart rate 114, normal intervals, nonspecific T wave inversion in aVL, which appears unchanged from most recent prior EKG, also showing a small millimeter ST elevation in V1, also unchanged from most recent prior EKG, showing interval development of new ST changes.  Imaging in the ED, per corresponding formal radiology read, was notable for the following: 1 view chest x-ray shows cardiomegaly with pulmonary vascular congestion, bilateral airspace opacities representing pulmonary edema versus multifocal pneumonia, the absence of pleural effusion or pneumothorax.  EDP has contacted on-call nephrology, Dr. Geralynn, requesting consult for HD in setting of acute volume overload/missed HD session.  While in the ED, the following were administered: Acetaminophen  600 mg  p.o. x 1 dose, Coreg  25 mg p.o. x 1 dose, fentanyl  50 mcg IV x 1, oxycodone  5 mg p.o. x 1 dose, Lasix  80 mg IV x 1 dose, nitroglycerin  paste for blood pressure management, azithromycin , Rocephin .  Subsequently, the patient was admitted for further evaluation management of presenting acute hypoxic respiratory failure in the setting of acute on chronic diastolic heart failure for recently missed hemodialysis session, with concern for community-acquired pneumonia complicated by severe sepsis, with presenting labs also notable for anion gap metabolic acidosis.     Review of Systems: As per HPI otherwise 10 point review of systems negative.   Past Medical History:  Diagnosis Date   Abnormal ECG    a. In setting of LVH w/ repolarization abnormalities; b. 03/2019 MV: No ischemia/infarct. EF 55-65%.   Angioedema    ESRD (end stage renal disease) (HCC)    a. prev on HD.   GERD (gastroesophageal reflux disease)    Hypertension    Lupus    LVH (left ventricular hypertrophy)    a. 11/2017 Echo: EF 50-55%. triv AI. Mild MR. Mod L and mild R atrial enlargement. Nl RV size/fxn. Small pericardial effusion w/o tampondae; b. 03/2018 Echo: EF 55-60%, restrictive filling pattern. Nl RV size/fxn. Sev LAE. Mild MR, mild to mod TR/PR. Mod PAH. Triv effusion.   Migraines    Mixed connective tissue disease (HCC)    Previous Dialysis patient [facility])    a. Off HD since late 2020.   Septic prepatellar bursitis of right knee 08/19/2017    Past Surgical History:  Procedure Laterality Date   GRAFT APPLICATION Left 03/30/2019   Procedure: BRACHIAL CEPHALIC GRAFT CREATION;  Surgeon: Marea Selinda RAMAN, MD;  Location: [facility] ORS;  Service: Vascular;  Laterality: Left;   I & D EXTREMITY Right 08/06/2017   Procedure: IRRIGATION AND DEBRIDEMENT KNEE;  Surgeon: Kendal Franky SQUIBB, MD;  Location: [facility] OR;  Service: Orthopedics;  Laterality: Right;   IR THROMBECTOMY AV FISTULA W/THROMBOLYSIS/PTA/STENT INC/SHUNT/IMG LT Left 05/18/2022   IR US   GUIDE VASC ACCESS LEFT  05/18/2022   MULTIPLE TOOTH EXTRACTIONS      Social History:  reports that she has been smoking e-cigarettes. She started smoking about 8 years ago. She has a 2.2 pack-year smoking history. She has never used smokeless tobacco. She reports that she does not drink alcohol and does not use drugs.   Allergies  Allergen Reactions   Lisinopril  Anaphylaxis    Angioedema   Gabapentin Other (See Comments)    Reaction: Tremor (intolerance); tremor   Ativan  [Lorazepam ] Other (See Comments)    Somnolent with 1mg  ativan  at [facility] Nov 2019   Hydroxychloroquine  Other (See Comments)    Pt reports making her skin peel really bad   Vicodin [Hydrocodone -Acetaminophen ] Itching and Rash    Family History  Problem Relation Age of Onset   Lupus Mother    Hypertension Mother    Kidney disease Mother    Heart Problems Mother    Coronary artery disease Mother 33       stent   Diabetes Maternal Grandmother     Family history reviewed and not pertinent    Prior to Admission medications   Medication Sig Start Date End Date Taking? Authorizing Provider  acetaminophen  (TYLENOL ) 325 MG tablet Take 2 tablets (650 mg total) by mouth every 6 (six) hours as needed for mild pain (pain score 1-3) or moderate pain (pain score  4-6) (or Fever >/= 101). 09/13/23   Hongalgi, Anand D, MD  amLODipine  (NORVASC ) 10 MG tablet Take 10 mg by mouth daily. 08/05/21   [provider]  calcitRIOL  (ROCALTROL ) 0.25 MCG capsule Take 1 tablet by mouth daily.    [provider]  carvedilol  (COREG ) 25 MG tablet Take 1 tablet (25 mg total) by mouth 2 (two) times daily with a meal. 09/03/23   Samtani, Jai-Gurmukh, MD  diclofenac  Sodium (VOLTAREN ) 1 % GEL Apply 2 g topically 4 (four) times daily. 09/23/23   Long, Fonda MATSU, MD  furosemide  (LASIX ) 80 MG tablet Take 80 mg by mouth 3 (three) times daily. 07/30/23   [provider]  lidocaine -prilocaine  (EMLA ) cream Apply 1 Application  topically every Monday, Wednesday, and Friday. 11/17/21   [provider]  losartan  (COZAAR ) 50 MG tablet Take 1 tablet (50 mg total) by mouth daily. 10/02/21 08/31/24  Dickie Begun, MD  medroxyPROGESTERone  (DEPO-PROVERA ) 150 MG/ML injection Inject 1 mL (150 mg total) into the muscle every 3 (three) months. 10/29/22   Rudy Carlin LABOR, MD  methocarbamol  (ROBAXIN ) 500 MG tablet Take 1 tablet (500 mg total) by mouth every 8 (eight) hours as needed for muscle spasms. 09/23/23   Long, Joshua G, MD  naloxone [facility]) nasal spray 4 mg/0.1 mL Place 1 spray into the nose once. If needed for emergency overdose.    [provider]  pantoprazole  (PROTONIX ) 40 MG tablet Take 1 tablet (40 mg total) by mouth daily as needed (for acid reflux). 09/13/23 10/13/23  Hongalgi, Anand D, MD  RENVELA  2.4 g PACK Take 2.4 g by mouth See admin instructions. Take with a large meal once a day to once every other day. 12/03/22   [provider]  Safety Seal Miscellaneous MISC Use on the alternate nights when you are not applying tretinoin  (Tuesday, Thursday, Saturday, and Sunday) 05/24/23   Alm Delon SAILOR, DO  sodium bicarbonate  650 MG tablet Take 1 tablet (650 mg total) by mouth 2 (two) times daily. 09/05/21   Singh, Prashant K, MD  tretinoin  (RETIN-A ) 0.025 % cream Apply 1 Application topically 3 (three) times a week. 07/05/23   [provider]  Vitamin D, Ergocalciferol, (DRISDOL) 1.25 MG (50000 UNIT) CAPS capsule ergocalciferol (vitamin D2) 1,250 mcg (50,000 unit) capsule TAKE 1 CAPSULE BY MOUTH WEEKLY 03/11/21   [provider]     Objective    Physical Exam: Vitals:   10/10/23 0245 10/10/23 0300 10/10/23 0330 10/10/23 0345  BP: (!) 185/134 (!) 178/135 (!) 176/129 (!) 186/135  Pulse: (!) 121 (!) 120  (!) 122  Resp: (!) 33 (!) 26 (!) 30 (!) 30  Temp:      TempSrc:      SpO2: 96% 99%  99%  Weight:      Height:        General: appears to be stated age; alert, oriented; on BiPAP,  mildly increased work of breathing noted. Skin: warm, dry, no rash Head:  AT/[place] Mouth:  Oral mucosa membranes appear moist, normal dentition Neck: supple; trachea midline Heart: Tachycardic, but regular; did not appreciate any M/R/G Lungs: CTAB, did not appreciate any wheezes, rales, or rhonchi Abdomen: + BS; soft, ND, NT Vascular: 2+ pedal pulses b/l; 2+ radial pulses b/l Extremities: no peripheral edema, no muscle wasting Neuro: strength and sensation intact in upper and lower extremities b/l    Labs on Admission: I have personally reviewed following labs and imaging studies  CBC: Recent Labs  Lab 10/10/23  0201 10/10/23 0206  WBC 17.0*  --   NEUTROABS 13.7*  --   HGB 10.6* 11.6*  HCT 34.2* 34.0*  MCV 98.3  --   PLT 224  --    Basic Metabolic Panel: Recent Labs  Lab 10/10/23 0201 10/10/23 0206  NA 136 136  K 5.1 5.1  CL 100 107  CO2 16*  --   GLUCOSE 94 93  BUN 71* 64*  CREATININE 11.10* 12.50*  CALCIUM  9.1  --    GFR: Estimated Creatinine Clearance: 7.1 mL/min (A) (by C-G formula based on SCr of 12.5 mg/dL (H)). Liver Function Tests: Recent Labs  Lab 10/10/23 0201  AST 15  ALT 9  ALKPHOS 85  BILITOT 1.2  PROT 7.6  ALBUMIN  3.5   No results for input(s): LIPASE, AMYLASE in the last 168 hours. No results for input(s): AMMONIA in the last 168 hours. Coagulation Profile: Recent Labs  Lab 10/10/23 0201  INR 1.2   Cardiac Enzymes: No results for input(s): CKTOTAL, CKMB, CKMBINDEX, TROPONINI in the last 168 hours. BNP (last 3 results) No results for input(s): PROBNP in the last 8760 hours. HbA1C: No results for input(s): HGBA1C in the last 72 hours. CBG: No results for input(s): GLUCAP in the last 168 hours. Lipid Profile: No results for input(s): CHOL, HDL, LDLCALC, TRIG, CHOLHDL, LDLDIRECT in the last 72 hours. Thyroid Function Tests: No results for input(s): TSH, T4TOTAL, FREET4, T3FREE, THYROIDAB in the  last 72 hours. Anemia Panel: No results for input(s): VITAMINB12, FOLATE, FERRITIN, TIBC, IRON , RETICCTPCT in the last 72 hours. Urine analysis:    Component Value Date/Time   COLORURINE YELLOW 09/11/2023 1413   APPEARANCEUR HAZY (A) 09/11/2023 1413   LABSPEC 1.008 09/11/2023 1413   PHURINE 9.0 (H) 09/11/2023 1413   GLUCOSEU NEGATIVE 09/11/2023 1413   HGBUR NEGATIVE 09/11/2023 1413   BILIRUBINUR NEGATIVE 09/11/2023 1413   BILIRUBINUR Negative 02/10/2018 1602   KETONESUR NEGATIVE 09/11/2023 1413   PROTEINUR >=300 (A) 09/11/2023 1413   UROBILINOGEN 0.2 02/10/2018 1602   UROBILINOGEN 1.0 03/30/2015 2328   NITRITE NEGATIVE 09/11/2023 1413   LEUKOCYTESUR NEGATIVE 09/11/2023 1413    Radiological Exams on Admission: DG Chest Port 1 View Result Date: 10/10/2023 CLINICAL DATA:  Questionable sepsis. EXAM: PORTABLE CHEST 1 VIEW COMPARISON:  September 11, 2023 FINDINGS: The cardiac silhouette is moderately enlarged and unchanged in size. There is prominence of the pulmonary vasculature with mild, ill-defined areas of airspace disease seen bilaterally. This is most notable throughout the left lung and right lung base. No pleural effusion or pneumothorax is identified. The visualized skeletal structures are unremarkable. IMPRESSION: 1. Cardiomegaly with mild pulmonary vascular congestion. 2. Mild bilateral airspace disease, which may represent mild pulmonary edema versus multifocal pneumonia. Electronically Signed   By: Suzen Dials M.D.   On: 10/10/2023 03:32      Assessment/Plan   Principal Problem:   Acute hypoxic respiratory failure (HCC) Active Problems:   ESRD (end stage renal disease) (HCC)   Essential hypertension   Severe sepsis (HCC)   High anion gap metabolic acidosis   CAP (community acquired pneumonia)   Acute on chronic diastolic CHF (congestive heart failure) (HCC)   History of anemia due to chronic kidney disease     #) Acute hypoxic respiratory failure  due to acute on chronic diastolic heart failure: In the context of the patient's acute respiratory symptoms and no known baseline supplemental oxygen requirements, initial oxygen saturations noted to be in the high 80s on room air, [place]  improving into the high 90s to 100% following initiation of BiPAP in the ED this evening.  This appears to be in the setting of acute on chronic diastolic heart failure stemming from recently missed hemodialysis session, with presenting chest x-ray showing cardiomegaly with increased pulmonary vascular congestion as well as suggestion of pulmonary edema.  This is in the context of a known history of chronic diastolic heart failure, with echocardiogram in January 2023 noting grade 2 diastolic dysfunction.  Overall, ACS leading to presenting acutely decompensated heart failure appears less likely at this time in the absence of any recent CP, presenting EKG showing no evidence of acute ischemic changes.   Will the patient has incisional disease on hemodialysis, she does continue to produce urine, and is noted to be on Lasix  80 mg p.o. 3 times daily as an outpatient.  She has received Lasix  80 mg IV x 1 dose in the emergency department this evening.  Will continue IV diuresis leading up to hemodialysis.  Of note, EDP is contacted on-call nephrology, requesting assistance setting of acute volume overload and recently missed hemodialysis session, as above.  Is received nitroglycerin  paste in the ED for blood pressure management as well as her home dose of Coreg .  Will also strive to improve afterload reduction to prevent improvement in cardiac output, although it is acknowledged that definitive blood pressure management will be via ensuing hemodialysis.  Aside from acute on chronic diastolic heart failure, there may also be an additional contribution towards her presenting acute hypoxic respiratory failure from community-acquired pneumonia, as further detailed below.  Clinically,  presentation is less suggestive of acute pulmonary embolism.  Chest x-ray shows no evidence of pneumothorax.   Plan: monitor strict I's & O's and daily weights. Monitor on telemetry. CMP in the morning.  Lasix  60 mg IV 3 times daily leading up to the dialysis.  Nephrology consulted for hemodialysis, as above.  Resume home Coreg , losartan , Norvasc , with next doses of losartan  and Norvasc  to occur now to attempt to improve afterload reduction.  Further evaluation management of suspected concomitant community-acquired pneumonia, as below.  Add on BNP, procalcitonin level.  Continue BiPAP for now.                       #) Severe sepsis due to community-acquired pneumonia: Suspected diagnosis on the basis of 1 day of progressive shortness of breath associated with new onset cough, subjective fever, elevated temperature in the ED, with temperature max noted to be 99.8, in the context of the patient's report of recent sick contacts.  Chest x-ray shows evidence of bilateral airspace opacities, which likely represents a component of pulmonary edema, but may also represent multifocal pneumonia given the above history.  COVID, influenza, RSV PCR are all negative.  SIRS criteria met via interval development of leukocytosis, with presenting levels of count of 17,000 and associated with left shift, along with tachycardia, tachypnea. Lactic acid level: Nonelevated at 1.1. Of note, given the associated presence of suspected end organ damage in the form of concominant presenting acute hypoxic respiratory failure, criteria are met for pt's sepsis to be considered severe in nature. However, in the absence of lactic acid level that is greater than or equal to 4.0, and in the absence of any associated hypotension refractory to IVF's, there are no indications for administration of a 30 mL/kg IVF bolus at this time.   Additional ED work-up/management notable for: Collection of blood cultures x 2 followed by  initiation of azithromycin  and Rocephin , which will be continued for now for coverage of community-acquired pneumonia.  Will also add on procalcitonin level.  Plan: CBC w/ diff and CMP in AM.  Follow for results of blood cx's x 2. Abx: Continue azithromycin , Rocephin , as above.  Add on procalcitonin level, strep pneumonia urine antigen.  Follow-up for result urinalysis.  As needed Tessalon  Perles.  Flutter valve.  Prn acetaminophen  for fever.                  #) Anion gap metabolic acidosis: Identified on today's CMP, and further quantified above.  This appears to be the basis of recently missed hemodialysis session in the setting of the patient's end-stage renal disease.  Nephrology has been consulted for hemodialysis, as above.  While the patient also appears to have severe sepsis due to community-acquired pneumonia, her lactate is not elevated.  Additionally, she has no history of underlying liver disease, and hepatic synthetic function appears preserved as evidenced by INR and PTT within normal limits.  Plan: Nephrology consulted for hemodialysis, as above.  Resume home sodium bicarbonate , with first dose now.  Monitor strict I's and O's and daily weights.  Repeat CMP, CBC in the morning.                   #) ESRD: on HD (schedule: Monday, Wednesday, Friday).  As described above, she missed her most recently scheduled hemodialysis session on 10/08/2023, rendering her most recently completed hemodialysis session to be on Wednesday, 10/06/2023.  Would benefit from hemodialysis given her presenting acute volume overload as well as anion gap metabolic acidosis.  Of note, potassium level is not elevated at this time, with most recent value of 5.1.  EDP is contacted on-call nephrology, requesting consultation for hemodialysis, as above.  Outpatient medications include sodium bicarbonate  650 mg p.o. twice daily.  Plan: monitor strict I's/O's, daily weights. CMP in the AM. Check  mag and phos levels.  Nephrology to consult for hemodialysis, as above.  Resume home sodium bicarbonate , with next dose now.  Monitor strict I's and O's and daily weights.                     #) Essential Hypertension: documented h/o such, with outpatient antihypertensive regimen including Norvasc , Coreg , Lasix , losartan .  SBP's in the ED today: 160s to 180s mmHg. We will definitive management of the patient's elevated blood pressure likely be achieved via  of hemodialysis, will strengthen the interval for improvement in blood pressure for the purpose of attempting to optimize afterload reduction as component of her heart failure management, as above.  She is already received her home dose of Coreg  as well as a dose of IV Lasix , as above.  Plan: Close monitoring of subsequent BP via routine VS. Norvasc  and losartan , with these doses to occur now.  Resume home Coreg , with next dose to occur on the evening of 10/10/2023.  IV Lasix , as above.  Monitor strict I's and O's and daily weights.  Nephrology consulted for hemodialysis.                 #) Anemia of chronic kidney disease: Documented history of such, a/w with baseline hgb range 10-12, with presenting hgb consistent with this range, in the absence of any overt evidence of active bleed.     Plan: Repeat CBC in the morning.       DVT prophylaxis: SCD's   Code Status: Full code Family  Communication: none Disposition Plan: Per Rounding Team Consults called: EDP has contacted on-call nephrology, Dr. Geralynn, rtequesting consult for hemodialysis, as further detailed above;  Admission status: inpatient     I SPENT GREATER THAN 75  MINUTES IN CLINICAL CARE TIME/MEDICAL DECISION-MAKING IN COMPLETING THIS ADMISSION.      Eva NOVAK Czar Ysaguirre DO [facility]  From 7PM - 7AM   10/10/2023, 4:10 AM

## 2023-10-10 NOTE — ED Notes (Signed)
 RT at bedside for Bipap.

## 2023-10-10 NOTE — Progress Notes (Addendum)
 Received patient in ED stretcher,awake,alert and oriented x 4. She signed her hd treatment's  consent.She is on oxygen/Watchtower @ 7 lpm ,saturating 98%.  Access used: Left upper arm AVG that worked well.  Duration of treatment: 3.25 hours.  UF goal: Met 3.5 L as challenged.  Hemo comment: Removed nitroglycerin  strip before the start of treatment.Gradually wean off from oxygen while on treatments,she is on room air since last 30 minutes of her treatment ,saturating 95-97% when awake.  Medicine given: Hydromorphone  0.5 mg IV.  Hand off to the patient's nurse:Brought into her room with stable medical condition via transporter.

## 2023-10-10 NOTE — ED Triage Notes (Signed)
 Pt BIB EMS from home x 24 hours that has gotten worse. Dialysis MWF, missed treatment on Friday. 90% on RA on EMS arrival. Given duoneb by EMS, reports improvement in breathing O2 remains at 90%. Recent sick contact with family member, unsure of what they have. Also reports chills. Hx of Lupus, HTN. Reports medication compliance.  EMS VS: 99.9 210/120 HR 110 ST 90% RA

## 2023-10-10 NOTE — Plan of Care (Signed)

## 2023-10-10 NOTE — ED Notes (Signed)
 Computer prompting to put in central line order set. Provider informed of this. Rn unsure which order set is correct.

## 2023-10-10 NOTE — Consult Note (Signed)
 Renal Service Consult Note [facility]  Linda [name] 10/10/2023 Linda JONETTA Fret, MD Requesting Physician: Dr. Sonjia  Reason for Consult: ESRD pt w/ SOB, chills HPI: The patient is a 28 y.o. year-old w/ PMH as below who presented to ED c/o SOB x 24 hrs, getting worse. Also feeling hot/ cold, chills, bodyaches, recent sick family member contact. In BP 210/120, HR 110, 90% RA. CXR showed multifocal infiltrates and vasc congestion, pulm edema and/or infection. Pt rec'd nebs, coreg , fentanyl , oxy IR, Lasix  80mg  IV, ntg paste, IV abx and bipap support. Pt was admitted. We are asked to see for esrd.   Pt states her breathing was hard the last 24 hrs. Has improved sig since arrival in the ED. She used bipap for about 3 hrs but then felt some stomach pain and nausea so she was switched back to [place] O2. GI symptoms have resolved off bipap. SOB is better overall this am, states she can lie flat now which she couldn't do before.   Missed HD Friday. She is upset because she has missed HD appts before and not had any complications like this. No other c/o's. On HD since April 2024.    ROS - denies CP, no joint pain, no HA, no blurry vision, no rash   Past Medical History  Past Medical History:  Diagnosis Date   Abnormal ECG    a. In setting of LVH w/ repolarization abnormalities; b. 03/2019 MV: No ischemia/infarct. EF 55-65%.   Angioedema    ESRD (end stage renal disease) (HCC)    on HD (M,W,F)   GERD (gastroesophageal reflux disease)    Hypertension    Lupus    LVH (left ventricular hypertrophy)    a. 11/2017 Echo: EF 50-55%. triv AI. Mild MR. Mod L and mild R atrial enlargement. Nl RV size/fxn. Small pericardial effusion w/o tampondae; b. 03/2018 Echo: EF 55-60%, restrictive filling pattern. Nl RV size/fxn. Sev LAE. Mild MR, mild to mod TR/PR. Mod PAH. Triv effusion.   Migraines    Mixed connective tissue disease (HCC)    Previous Dialysis patient [facility])    a. Off HD since  late 2020.   Septic prepatellar bursitis of right knee 08/19/2017   Past Surgical History  Past Surgical History:  Procedure Laterality Date   GRAFT APPLICATION Left 03/30/2019   Procedure: BRACHIAL CEPHALIC GRAFT CREATION;  Surgeon: Marea Selinda RAMAN, MD;  Location: [facility] ORS;  Service: Vascular;  Laterality: Left;   I & D EXTREMITY Right 08/06/2017   Procedure: IRRIGATION AND DEBRIDEMENT KNEE;  Surgeon: Kendal Franky SQUIBB, MD;  Location: [facility] OR;  Service: Orthopedics;  Laterality: Right;   IR THROMBECTOMY AV FISTULA W/THROMBOLYSIS/PTA/STENT INC/SHUNT/IMG LT Left 05/18/2022   IR US  GUIDE VASC ACCESS LEFT  05/18/2022   MULTIPLE TOOTH EXTRACTIONS     Family History  Family History  Problem Relation Age of Onset   Lupus Mother    Hypertension Mother    Kidney disease Mother    Heart Problems Mother    Coronary artery disease Mother 67       stent   Diabetes Maternal Grandmother    Social History  reports that she has been smoking e-cigarettes. She started smoking about 8 years ago. She has a 2.2 pack-year smoking history. She has never used smokeless tobacco. She reports that she does not drink alcohol and does not use drugs. Allergies  Allergies  Allergen Reactions   Lisinopril  Anaphylaxis    Angioedema   Gabapentin Other (  See Comments)    Reaction: Tremor (intolerance); tremor   Ativan  [Lorazepam ] Other (See Comments)    Somnolent with 1mg  ativan  at [facility] Nov 2019   Hydroxychloroquine  Other (See Comments)    Pt reports making her skin peel really bad   Vicodin [Hydrocodone -Acetaminophen ] Itching and Rash   Home medications Prior to Admission medications   Medication Sig Start Date End Date Taking? Authorizing Provider  acetaminophen  (TYLENOL ) 325 MG tablet Take 2 tablets (650 mg total) by mouth every 6 (six) hours as needed for mild pain (pain score 1-3) or moderate pain (pain score 4-6) (or Fever >/= 101). 09/13/23   Hongalgi, Anand D, MD  amLODipine  (NORVASC ) 10 MG tablet Take 10  mg by mouth daily. 08/05/21   [provider]  calcitRIOL  (ROCALTROL ) 0.25 MCG capsule Take 1 tablet by mouth daily.    [provider]  carvedilol  (COREG ) 25 MG tablet Take 1 tablet (25 mg total) by mouth 2 (two) times daily with a meal. 09/03/23   Samtani, Jai-Gurmukh, MD  diclofenac  Sodium (VOLTAREN ) 1 % GEL Apply 2 g topically 4 (four) times daily. 09/23/23   Long, Joshua G, MD  furosemide  (LASIX ) 80 MG tablet Take 80 mg by mouth 3 (three) times daily. 07/30/23   [provider]  lidocaine -prilocaine  (EMLA ) cream Apply 1 Application topically every Monday, Wednesday, and Friday. 11/17/21   [provider]  losartan  (COZAAR ) 50 MG tablet Take 1 tablet (50 mg total) by mouth daily. 10/02/21 08/31/24  Dickie Begun, MD  medroxyPROGESTERone  (DEPO-PROVERA ) 150 MG/ML injection Inject 1 mL (150 mg total) into the muscle every 3 (three) months. 10/29/22   Rudy Carlin LABOR, MD  methocarbamol  (ROBAXIN ) 500 MG tablet Take 1 tablet (500 mg total) by mouth every 8 (eight) hours as needed for muscle spasms. 09/23/23   Long, Joshua G, MD  naloxone [facility]) nasal spray 4 mg/0.1 mL Place 1 spray into the nose once. If needed for emergency overdose.    [provider]  pantoprazole  (PROTONIX ) 40 MG tablet Take 1 tablet (40 mg total) by mouth daily as needed (for acid reflux). 09/13/23 10/13/23  Hongalgi, Anand D, MD  RENVELA  2.4 g PACK Take 2.4 g by mouth See admin instructions. Take with a large meal once a day to once every other day. 12/03/22   [provider]  Safety Seal Miscellaneous MISC Use on the alternate nights when you are not applying tretinoin  (Tuesday, Thursday, Saturday, and Sunday) 05/24/23   Alm Delon SAILOR, DO  sodium bicarbonate  650 MG tablet Take 1 tablet (650 mg total) by mouth 2 (two) times daily. 09/05/21   Singh, Prashant K, MD  tretinoin  (RETIN-A ) 0.025 % cream Apply 1 Application topically 3 (three) times a week. 07/05/23   [provider]   Vitamin D, Ergocalciferol, (DRISDOL) 1.25 MG (50000 UNIT) CAPS capsule ergocalciferol (vitamin D2) 1,250 mcg (50,000 unit) capsule TAKE 1 CAPSULE BY MOUTH WEEKLY 03/11/21   [provider]     Vitals:   10/10/23 0500 10/10/23 0515 10/10/23 0530 10/10/23 0608  BP: (!) 148/122 (!) 126/102 113/71   Pulse: (!) 110 98    Resp: (!) 28 (!) 23 (!) 28   Temp:    97.6 F (36.4 C)  TempSrc:      SpO2: 96% 98%    Weight:      Height:       Exam Gen alert, no distress, [place] O2, looks comfortable No rash, cyanosis or gangrene Sclera anicteric, throat clear  No jvd or bruits Chest crackles L base, R mostly clear RRR no MRG Abd soft ntnd no mass or ascites +bs GU defer MS no joint effusions or deformity Ext no LE or UE edema, no other edema Neuro is alert, Ox 3 , nf    LUA AVF+bruit       Renal-related home meds: - norvasc  10 - rocaltrol  0.25 mcg daily - coreg  25 bid - lasix  80 tid - losartan  50 every day - sod bicarb 650 bid - renvela  2400mg  ac tid - others: PPI, depo-provera , vit D    [facility] HD[facility] [place] [facility] From [facility] 2025 --> 3.5h   350/500   73.9kg   2/2 bath  LUA AVF   Heparin  none     CXR 2/09 - IMPRESSION: Cardiomegaly with mild pulmonary vascular congestion. Mild bilateral airspace disease, which may represent mild pulmonary edema versus multifocal pneumonia.    Assessment/ Plan: Acute hypoxic resp failure - w/ multifocal infiltrates could be infectious and/or fluid overload. Getting IV abx per pmd. Sp 3h bipap overnight, dc'd due to GI symptoms. Pt is stable now on [place] O2. Plan HD this am w/ max fluid removal as tolerated.  ESRD - on HD MWF. Missed Friday HD. HD as above.  HTN - chronic issue, cont home meds.  Volume - as above, not grossly vol overloaded but likely needs fluid off Anemia of esrd - Hb 10-12 here. No esa needs for now.  Secondary hyperparathyroidism - CCa in range and phos a bit high. Cont binders w/ meals.       Myer Fret  MD [facility] 10/10/2023,  8:13 AM  Recent Labs  Lab 10/10/23 0201 10/10/23 0206 10/10/23 0400  HGB 10.6* 11.6*  --   ALBUMIN  3.5  --   --   CALCIUM  9.1  --   --   PHOS  --   --  6.5*  CREATININE 11.10* 12.50*  --   K 5.1 5.1  --    Inpatient medications:  amLODipine   10 mg Oral Daily   carvedilol   25 mg Oral BID WC   Chlorhexidine  Gluconate Cloth  6 each Topical Daily   furosemide   60 mg Intravenous TID   losartan   50 mg Oral Daily   sodium bicarbonate   650 mg Oral BID   sodium chloride  flush  10-40 mL Intracatheter Q12H    azithromycin      cefTRIAXone  (ROCEPHIN )  IV     acetaminophen  **OR** acetaminophen , benzonatate , HYDROmorphone  (DILAUDID ) injection, melatonin, ondansetron  (ZOFRAN ) IV, sodium chloride  flush

## 2023-10-10 NOTE — Progress Notes (Signed)
 Same day note  Linda [name] is a 28 y.o. female with past medical history significant for end-stage renal disease on hemodialysis on Monday, Wednesday, Friday , lupus nephritis, chronic diastolic heart failure,  hypertension, anemia of chronic kidney disease presented hospital with shortness of breath.  Patient had missed hemodialysis on 08/06/2024.  On the same day she developed progressive shortness of breath with cough subjective fever.  In the ED patient had temperature of 99.8 F, pulse ox was in the high 80% on room air..  Labs were notable for potassium of 5.1 bicarb of 16 with creatinine of 11.1.  Lactate was 1.1.  CBC showed leukocytosis at 17K.  Hemoglobin of 10.6.  Blood cultures with drawn from the ED.  COVID influenza and RSV were negative.  EKG showed sinus tachycardia.  Chest x-ray showed vascular congestion with bilateral opacities representing pulmonary edema versus multifocal pneumonia.  She was initially started on BiPAP for respiratory distress.  Nephrology was consulted for acute volume overload  and was admitted to hospital for further evaluation and treatment.   Patient seen and examined at bedside.  Patient was admitted to the hospital for shortness of breath.  At the time of my evaluation, patient complains of mild shortness of breath but better than when she came in.  Seen during hemodialysis.  Physical examination reveals average built female, not in obvious distress, on nasal cannula oxygen diminished breath sounds noted.  Laboratory data and imaging was reviewed  Assessment and Plan.  Acute hypoxic respiratory failure due to acute on chronic diastolic heart failure:  Missed hemodialysis.   Hypoxic on presentation with pulse ox of 80% on room air.  Required BiPAP initially.  Chest x-ray with vascular congestion.  On Lasix  80 3 times daily as outpatient.  Received Lasix  80 mg IV x 1 in the ED.  Nephrology has been consulted for hemodialysis since she had missed  hemodialysis.  Continue Lasix  60 mg 3 times daily until dialysis.  Procalcitonin of 0.6.  BNP of 4431.     Severe sepsis due to community-acquired pneumonia: Symptomatic with shortness of breath and cough and fever with chest x-ray with bilateral opacities.  Had significant leukocytosis with tachycardia and tachypnea on presentation with hypoxic respiratory failure suggestive of sepsis.  Follow blood cultures.  Has been started on azithromycin  and Rocephin .  Will continue for now.  Flu COVID and RSV was negative.  Temperature max at this time at 99.8 F.   Anion gap metabolic acidosis: Secondary to missed hemodialysis.  Nephrology has been consulted.  Continue home sodium bicarb.    ESRD: on HD (schedule: Monday, Wednesday, Friday).   Nephrology Dr. Geralynn has been notified for hemodialysis needs.     Essential Hypertension: Patient is on Norvasc , Coreg , Lasix , losartan  at home.  Continue with amlodipine  and Coreg .SABRA    Anemia of chronic kidney disease:  baseline hgb range 10-12, with presenting hgb consistent with this range, currently at baseline.      No Charge  Signed,  Vernal Anselm Alstrom, MD [facility]

## 2023-10-10 NOTE — Hospital Course (Addendum)
 Linda [name] is a 28 y.o. female with past medical history significant for end-stage renal disease on hemodialysis on Monday, Wednesday, Friday , lupus nephritis, chronic diastolic heart failure,  hypertension, anemia of chronic kidney disease presented hospital with shortness of breath.  Patient had missed hemodialysis on 08/06/2024.  On the same day she developed progressive shortness of breath with cough subjective fever.  In the ED patient had temperature of 99.8 F, pulse ox was in the high 80% on room air..  Labs were notable for potassium of 5.1 bicarb of 16 with creatinine of 11.1.  Lactate was 1.1.  CBC showed leukocytosis at 17K.  Hemoglobin of 10.6.  Blood cultures with drawn from the ED.  COVID influenza and RSV were negative.  EKG showed sinus tachycardia.  Chest x-ray showed vascular congestion with bilateral opacities representing pulmonary edema versus multifocal pneumonia.  She was initially started on BiPAP for respiratory distress.  Nephrology was consulted for acute volume overload  and was admitted to hospital for further evaluation and treatment.    Assessment and Plan.  Acute hypoxic respiratory failure due to acute on chronic diastolic heart failure:  Missed hemodialysis.   Hypoxic on presentation with pulse ox of 80% on room air.  Required BiPAP initially.  Chest x-ray with vascular congestion.  On Lasix  80 3 times daily as outpatient.  Received Lasix  80 mg IV x 1 in the ED.  Nephrology has been consulted for hemodialysis since she had missed hemodialysis.  Continue Lasix  60 mg 3 times daily until dialysis.  Procalcitonin of 0.6.  BNP of 4431.     Severe sepsis due to community-acquired pneumonia: Symptomatic with shortness of breath and cough and fever with chest x-ray with bilateral opacities.  Had significant leukocytosis with tachycardia and tachypnea on presentation with hypoxic respiratory failure suggestive of sepsis.  Follow blood cultures.  Has been started on  azithromycin  and Rocephin .  Will continue for now.  Flu COVID and RSV was negative.  Temperature max at this time at 99.8 F.   Anion gap metabolic acidosis: Secondary to missed hemodialysis.  Nephrology has been consulted.  Continue home sodium bicarb.    ESRD: on HD (schedule: Monday, Wednesday, Friday).   Nephrology Dr. Geralynn has been notified for hemodialysis needs.     Essential Hypertension: Patient is on Norvasc , Coreg , Lasix , losartan  at home.  Continue with amlodipine  and Coreg ..    Anemia of chronic kidney disease:  baseline hgb range 10-12, with presenting hgb consistent with this range, currently at baseline.

## 2023-10-11 DIAGNOSIS — J9601 Acute respiratory failure with hypoxia: Secondary | ICD-10-CM | POA: Diagnosis not present

## 2023-10-11 LAB — BASIC METABOLIC PANEL
Anion gap: 18 — ABNORMAL HIGH (ref 5–15)
BUN: 43 mg/dL — ABNORMAL HIGH (ref 6–20)
CO2: 22 mmol/L (ref 22–32)
Calcium: 9.4 mg/dL (ref 8.9–10.3)
Chloride: 99 mmol/L (ref 98–111)
Creatinine, Ser: 7.96 mg/dL — ABNORMAL HIGH (ref 0.44–1.00)
GFR, Estimated: 7 mL/min — ABNORMAL LOW (ref >60)
Glucose, Bld: 83 mg/dL (ref 70–99)
Potassium: 4.4 mmol/L (ref 3.5–5.1)
Sodium: 139 mmol/L (ref 135–145)

## 2023-10-11 LAB — MAGNESIUM: Magnesium: 2.3 mg/dL (ref 1.7–2.4)

## 2023-10-11 LAB — CBC
HCT: 34.8 % — ABNORMAL LOW (ref 36.0–46.0)
Hemoglobin: 10.7 g/dL — ABNORMAL LOW (ref 12.0–15.0)
MCH: 30.1 pg (ref 26.0–34.0)
MCHC: 30.7 g/dL (ref 30.0–36.0)
MCV: 97.8 fL (ref 80.0–100.0)
Platelets: 226 10*3/uL (ref 150–400)
RBC: 3.56 MIL/uL — ABNORMAL LOW (ref 3.87–5.11)
RDW: 18 % — ABNORMAL HIGH (ref 11.5–15.5)
WBC: 10.6 10*3/uL — ABNORMAL HIGH (ref 4.0–10.5)
nRBC: 0 % (ref 0.0–0.2)

## 2023-10-11 LAB — PHOSPHORUS: Phosphorus: 7.4 mg/dL — ABNORMAL HIGH (ref 2.5–4.6)

## 2023-10-11 MED ORDER — HEPARIN SODIUM (PORCINE) 1000 UNIT/ML DIALYSIS: 1000.0000 [IU] | INTRAMUSCULAR | Status: DC | PRN

## 2023-10-11 MED ORDER — PENTAFLUOROPROP-TETRAFLUOROETH EX AERO: 1.0000 | INHALATION_SPRAY | CUTANEOUS | Status: DC | PRN

## 2023-10-11 MED ORDER — CHLORHEXIDINE GLUCONATE CLOTH 2 % EX PADS
6.0000 | MEDICATED_PAD | Freq: Every day | CUTANEOUS | Status: DC
  Administered 2023-10-11: 6 via TOPICAL

## 2023-10-11 MED ORDER — LIDOCAINE HCL (PF) 1 % IJ SOLN: 5.0000 mL | INTRAMUSCULAR | Status: DC | PRN

## 2023-10-11 MED ORDER — LIDOCAINE-PRILOCAINE 2.5-2.5 % EX CREA: 1.0000 | TOPICAL_CREAM | CUTANEOUS | Status: DC | PRN

## 2023-10-11 MED ORDER — HYDRALAZINE HCL 20 MG/ML IJ SOLN
10.0000 mg | Freq: Four times a day (QID) | INTRAMUSCULAR | Status: DC | PRN
  Administered 2023-10-11 (×2): 10 mg via INTRAVENOUS
  Filled 2023-10-11 (×2): qty 1

## 2023-10-11 MED ORDER — ALTEPLASE 2 MG IJ SOLR: 2.0000 mg | Freq: Once | INTRAMUSCULAR | Status: DC | PRN

## 2023-10-11 MED ORDER — DICYCLOMINE HCL 10 MG PO CAPS
10.0000 mg | ORAL_CAPSULE | Freq: Three times a day (TID) | ORAL | Status: DC
  Administered 2023-10-11 – 2023-10-12 (×3): 10 mg via ORAL
  Filled 2023-10-11 (×3): qty 1

## 2023-10-11 NOTE — TOC Initial Note (Addendum)
 Transition of Care [facility]) - Initial/Assessment Note    Patient Details  Name: Linda [name] MRN: [id number] Date of Birth: 02/18/1996  [facility] CM/SW Contact:    Linda [name], LCSWA Phone Number: 10/11/2023, 2:48 PM  Clinical Narrative:                    CSW spoke with patient at bedside.Patient reports she comes from home alone. Patient reports she has transportation to and from HD through [employer]/medicaid. Patient reports her Linda [name] will pick her up when medically ready for dc. All questions answered. No further questions reported at this time.     Patient Goals and CMS Choice            Expected Discharge Plan and Services                                              Prior Living Arrangements/Services                       Activities of Daily Living   ADL Screening (condition at time of admission) Independently performs ADLs?: Yes (appropriate for developmental age) Is the patient deaf or have difficulty hearing?: No Does the patient have difficulty seeing, even when wearing glasses/contacts?: No Does the patient have difficulty concentrating, remembering, or making decisions?: No  Permission Sought/Granted                  Emotional Assessment              Admission diagnosis:  Acute pulmonary edema (HCC) [J81.0] Acute respiratory failure with hypoxia (HCC) [J96.01] Acute hypoxic respiratory failure (HCC) [J96.01] Patient Active Problem List   Diagnosis Date Noted   Acute hypoxic respiratory failure (HCC) 10/10/2023   CAP (community acquired pneumonia) 10/10/2023   Acute on chronic diastolic CHF (congestive heart failure) (HCC) 10/10/2023   History of anemia due to chronic kidney disease 10/10/2023   Pulmonary edema 09/11/2023   Shortness of breath 09/11/2023   Pain in multiple muscles 09/11/2023   Nausea vomiting and diarrhea 09/01/2023   ESRD (end stage renal disease) (HCC) 09/30/2021   Acute  pulmonary edema (HCC) 09/30/2021   GERD without esophagitis 09/30/2021   Volume overload 09/02/2021   High anion gap metabolic acidosis 09/02/2021   Macrocytic anemia 09/02/2021   Elevated troponin 09/02/2021   Anxiety 11/03/2019   Strain of sternocleidomastoid muscle 11/03/2019   Difficulty with speech 11/03/2019   Prolonged QT interval 03/02/2019   Chronic heart failure with preserved ejection fraction (HFpEF) (HCC) 03/02/2019   ESRD on dialysis (HCC) 01/10/2019   Calciphylaxis 12/27/2018   Tobacco abuse 03/02/2018   Severe sepsis (HCC) 01/06/2018   Angioedema 01/02/2018   Tachycardia 01/02/2018   Chronic kidney disease, stage 5 (HCC)    Hypertensive urgency 11/29/2017   Grief reaction 08/19/2017   Right knee pain    Bilateral knee swelling 04/20/2017   Systemic lupus erythematosus (HCC)    Essential hypertension 12/11/2016   Mixed connective tissue disease (HCC) 02/11/2016   Implanon  in place 01/19/2014   PCP:  Pcp, No Pharmacy:   [id number] [place],  - [address] [place] [address] Phone: [phone] Fax: [phone]     Social Drivers of Health (SDOH) Social History: SDOH Screenings   Food Insecurity: No Food  Insecurity (10/10/2023)  Housing: Low Risk  (10/10/2023)  Transportation Needs: No Transportation Needs (10/10/2023)  Utilities: At Risk (10/10/2023)  Depression (PHQ2-9): Low Risk  (02/23/2020)  Financial Resource Strain: Low Risk  (09/10/2023)   [facility]  Social Connections: Unknown (01/12/2022)   [facility]  Tobacco Use: High Risk (10/10/2023)   SDOH Interventions:     Readmission Risk Interventions    10/02/2021    9:11 AM 10/01/2021    2:34 PM  Readmission Risk Prevention Plan  Transportation Screening  Complete  PCP or Specialist Appt within 3-5 Days Complete --  HRI or Home Care Consult Complete Not Complete  HRI or Home Care Consult comments  Lives with  grandmother. No recommendations from PT/OT  Social Work Consult for Recovery Care Planning/Counseling  Complete  Palliative Care Screening  Not Applicable  Medication Review Ocean[employer])  Complete

## 2023-10-11 NOTE — Progress Notes (Signed)
 Pt receives out-pt HD at [facility] [place] on MWF. Will assist as needed.   Olivia Canter Renal Navigator [phone]

## 2023-10-11 NOTE — Procedures (Signed)
 Received patient in bed to unit.  Alert and oriented.  Informed consent signed and in chart.   TX duration: 3 hours 11 min  Patient tolerated well until treatment almost over.  Transported back to the room  Alert, without acute distress.  Hand-off given to patient's nurse.   Access used: left graft Access issues: none  Total UF removed: 2.9 liters Medication(s) given: dilaudid    Clover Dao, RN Kidney Dialysis Unit

## 2023-10-11 NOTE — Progress Notes (Signed)
 [facility] Progress Note   Subjective:    Seen and examined patient at bedside. She reports mild SOB but better compared to when she was admitted. Tolerated yesterday's HD with net UF 3.5L.  Denies CP and N/V. She is inquiring whether her EDW needs to be adjusted. Plan for another HD this afternoon/evening to get her back on MWF schedule.  Objective Vitals:   10/10/23 1821 10/10/23 2006 10/11/23 0001 10/11/23 0405  BP:  (!) 162/116 (!) 149/107 (!) 148/112  Pulse: 91 98 78 92  Resp:  20 16 18   Temp:  99 F (37.2 C) 98.4 F (36.9 C) 98.1 F (36.7 C)  TempSrc:  Oral Oral Oral  SpO2: 98% 93% 93% 95%  Weight:    71.3 kg  Height:       Physical Exam General: Awake, alert, on RA, NAD Heart: S1 and S2; No MRGs Lungs: Clear posteriorly; Breathing unlabored Abdomen: Soft and non-tender Extremities: No LE edema Dialysis Access: L AVF (+) B/T   Filed Weights   10/10/23 1133 10/10/23 1537 10/11/23 0405  Weight: 79.6 kg 76.1 kg 71.3 kg    Intake/Output Summary (Last 24 hours) at 10/11/2023 0839 Last data filed at 10/11/2023 0300 Gross per 24 hour  Intake 359.87 ml  Output 3700 ml  Net -3340.13 ml    Additional Objective Labs: Basic Metabolic Panel: Recent Labs  Lab 10/10/23 0201 10/10/23 0206 10/10/23 0400 10/11/23 0419  NA 136 136  --  139  K 5.1 5.1  --  4.4  CL 100 107  --  99  CO2 16*  --   --  22  GLUCOSE 94 93  --  83  BUN 71* 64*  --  43*  CREATININE 11.10* 12.50*  --  7.96*  CALCIUM  9.1  --   --  9.4  PHOS  --   --  6.5* 7.4*   Liver Function Tests: Recent Labs  Lab 10/10/23 0201  AST 15  ALT 9  ALKPHOS 85  BILITOT 1.2  PROT 7.6  ALBUMIN  3.5   No results for input(s): "LIPASE", "AMYLASE" in the last 168 hours. CBC: Recent Labs  Lab 10/10/23 0201 10/10/23 0206 10/11/23 0419  WBC 17.0*  --  10.6*  NEUTROABS 13.7*  --   --   HGB 10.6* 11.6* 10.7*  HCT 34.2* 34.0* 34.8*  MCV 98.3  --  97.8  PLT 224  --  226   Blood Culture     Component Value Date/Time   SDES BLOOD BLOOD RIGHT ARM 10/10/2023 0206   SPECREQUEST  10/10/2023 0206    BOTTLES DRAWN AEROBIC AND ANAEROBIC Blood Culture results may not be optimal due to an inadequate volume of blood received in culture bottles   CULT  10/10/2023 0206    NO GROWTH < 12 HOURS Performed at [facility], [address] [place], [place] [address]    REPTSTATUS PENDING 10/10/2023 0206    Cardiac Enzymes: No results for input(s): "CKTOTAL", "CKMB", "CKMBINDEX", "TROPONINI" in the last 168 hours. CBG: Recent Labs  Lab 10/10/23 0543  GLUCAP 100*   Iron  Studies: No results for input(s): "IRON ", "TIBC", "TRANSFERRIN", "FERRITIN" in the last 72 hours. Lab Results  Component Value Date   INR 1.2 10/10/2023   INR 1.0 03/27/2019   INR 1.01 01/25/2018   Studies/Results: DG Chest Port 1 View Result Date: 10/10/2023 CLINICAL DATA:  Questionable sepsis. EXAM: PORTABLE CHEST 1 VIEW COMPARISON:  September 11, 2023 FINDINGS: The cardiac silhouette  is moderately enlarged and unchanged in size. There is prominence of the pulmonary vasculature with mild, ill-defined areas of airspace disease seen bilaterally. This is most notable throughout the left lung and right lung base. No pleural effusion or pneumothorax is identified. The visualized skeletal structures are unremarkable. IMPRESSION: 1. Cardiomegaly with mild pulmonary vascular congestion. 2. Mild bilateral airspace disease, which may represent mild pulmonary edema versus multifocal pneumonia. Electronically Signed   By: Virgle Grime M.D.   On: 10/10/2023 03:32    Medications:  azithromycin  250 mL/hr at 10/10/23 2247   cefTRIAXone  (ROCEPHIN )  IV 1 g (10/10/23 2249)    amLODipine   10 mg Oral Daily   carvedilol   25 mg Oral BID WC   furosemide   60 mg Intravenous TID   influenza vac split trivalent PF  0.5 mL Intramuscular Tomorrow-1000   losartan   50 mg Oral Daily   pneumococcal 20-valent conjugate vaccine  0.5 mL  Intramuscular Tomorrow-1000   sodium bicarbonate   650 mg Oral BID   sodium chloride  flush  10-40 mL Intracatheter Q12H    Dialysis Orders: [facility] MWF From jan 2025 --> 3.5h   350/500   73.9kg   2/2 bath  LUA AVF   Heparin  none   CXR 2/09 - IMPRESSION: Cardiomegaly with mild pulmonary vascular congestion. Mild bilateral airspace disease, which may represent mild pulmonary edema versus multifocal pneumonia.   Assessment/Plan: Acute hypoxic resp failure - w/ multifocal infiltrates could be infectious and/or fluid overload. Getting IV abx per pmd. S/p 3h bipap at admit and dc'd due to GI symptoms. Pt still having mild SOB but reports breathing is better compared to admission ESRD - on HD MWF. Plan for HD this afternoon/evening to get her back on MWF schedule. UFG set 3-3.5L.  HTN - chronic issue, cont home meds.  Volume - as above, not grossly vol overloaded but likely needs fluid off. Noted patient misses HD frequently at her outpatient facility. Discussed HD compliance, restricting fluids, and low sodium diet. Will check a CXR in AM. Anemia of esrd - Hb 10-12 here. No esa needs for now.  Secondary hyperparathyroidism - CCa in range and phos a bit high. Cont binders w/ meals.   Jadene Maxwell, NP [facility] 10/11/2023,8:39 AM  LOS: 1 day

## 2023-10-11 NOTE — Progress Notes (Signed)
 PROGRESS NOTE  GERETHA HOLLIFIELD [name]:[id number] DOB: [birth year] DOA: 10/10/2023 PCP: Pcp, No   LOS: 1 day   Brief narrative:  Linda LESAR is a 28 y.o. female with past medical history significant for end-stage renal disease on hemodialysis on Monday, Wednesday, Friday , lupus nephritis, chronic diastolic heart failure,  hypertension, anemia of chronic kidney disease presented hospital with shortness of breath.  Patient had missed hemodialysis on 08/06/2024.  On the same day she developed progressive shortness of breath with cough subjective fever.  In the ED patient had temperature of 99.8 F, pulse ox was in the high 80% on room air..  Labs were notable for potassium of 5.1 bicarb of 16 with creatinine of 11.1.  Lactate was 1.1.  CBC showed leukocytosis at 17K.  Hemoglobin of 10.6.  Blood cultures with drawn from the ED.  COVID influenza and RSV were negative.  EKG showed sinus tachycardia.  Chest x-ray showed vascular congestion with bilateral opacities representing pulmonary edema versus multifocal pneumonia.  She was initially started on BiPAP for respiratory distress.  Nephrology was consulted for acute volume overload  and was admitted to hospital for further evaluation and treatment.   Assessment/Plan: Principal Problem:   Acute hypoxic respiratory failure (HCC) Active Problems:   ESRD (end stage renal disease) (HCC)   Essential hypertension   Severe sepsis (HCC)   High anion gap metabolic acidosis   CAP (community acquired pneumonia)   Acute on chronic diastolic CHF (congestive heart failure) (HCC)   History of anemia due to chronic kidney disease  Acute hypoxic respiratory failure due to acute on chronic diastolic heart failure:  Missed hemodialysis.   Hypoxic on presentation with pulse ox of 80% on room air.  Required BiPAP initially.  Initial chest x-ray with vascular congestion.  On Lasix  80 mg 3 times daily as outpatient.  Received Lasix  80 mg IV x 1 in the ED.  Nephrology  was consulted for hemodialysis.  Continue Lasix  60 mg 3 times daily nephrology on board.  Procalcitonin of 0.6.  BNP of 4431.    Severe sepsis due to community-acquired pneumonia:  Patient was symptomatic with shortness of breath and cough and fever with chest x-ray with bilateral opacities.  Had significant leukocytosis with tachycardia and tachypnea on presentation with hypoxic respiratory failure suggestive of sepsis.  Blood cultures negative in less than 12 hours.  Continue azithromycin  and Rocephin .  Will continue for now.  Flu COVID and RSV was negative.  Temperature max at this time at 99.8 F.  MRSA PCR negative.   Anion gap metabolic acidosis: Secondary to missed hemodialysis.  Nephrology has been consulted.  Continue sodium bicarb.    ESRD: on HD (schedule: Monday, Wednesday, Friday).   Nephrology on board for hemodialysis needs.    Essential Hypertension: Pressure seems to be elevated today.  Patient is on Norvasc , Coreg , Lasix , losartan  at home.  Has been resumed.  Thinly IV Lasix .  Add as needed hydralazine .   Anemia of chronic kidney disease:  baseline hgb range 10-12, latest hemoglobin at 10.6.  DVT prophylaxis: SCDs Start: 10/10/23 0410   Disposition: Home likely in 1 to 2 days  Status is: Inpatient Remains inpatient appropriate because: Missed dialysis, plan for hemodialysis, IV antibiotics, pending clinical improvement    Code Status:     Code Status: Full Code  Family Communication: None at bedside  Consultants: Nephrology  Procedures: Hemodialysis BiPAP placement  Anti-infectives:  Rocephin  and Zithromax  2/9>  Anti-infectives (From admission, onward)  Start     Dose/Rate Route Frequency Ordered Stop   10/10/23 2200  azithromycin  (ZITHROMAX ) 500 mg in sodium chloride  0.9 % 250 mL IVPB        500 mg 250 mL/hr over 60 Minutes Intravenous Every 24 hours 10/10/23 0428     10/10/23 2200  cefTRIAXone  (ROCEPHIN ) 1 g in sodium chloride  0.9 % 100 mL IVPB         1 g 200 mL/hr over 30 Minutes Intravenous Every 24 hours 10/10/23 0428     10/10/23 0400  cefTRIAXone  (ROCEPHIN ) 1 g in sodium chloride  0.9 % 100 mL IVPB        1 g 200 mL/hr over 30 Minutes Intravenous  Once 10/10/23 0347 10/10/23 0430   10/10/23 0400  azithromycin  (ZITHROMAX ) 500 mg in sodium chloride  0.9 % 250 mL IVPB        500 mg 250 mL/hr over 60 Minutes Intravenous  Once 10/10/23 0347 10/10/23 0548        Subjective: Today, patient was seen and examined at bedside.  Patient states that she feels little better with breathing today./Some cough with congestion and some cramps over the abdomen with back pain.  No nausea or vomiting.  Had something to eat.  Objective: Vitals:   10/11/23 0405 10/11/23 1124  BP: (!) 148/112 (!) 156/122  Pulse: 92 94  Resp: 18 16  Temp: 98.1 F (36.7 C) 98.8 F (37.1 C)  SpO2: 95% 96%    Intake/Output Summary (Last 24 hours) at 10/11/2023 1234 Last data filed at 10/11/2023 0912 Gross per 24 hour  Intake 468.41 ml  Output 3700 ml  Net -3231.59 ml   Filed Weights   10/10/23 1133 10/10/23 1537 10/11/23 0405  Weight: 79.6 kg 76.1 kg 71.3 kg   Body mass index is 25.36 kg/m.   Physical Exam:  GENERAL: Patient is alert awake and oriented. Not in obvious distress. HENT: No scleral pallor or icterus. Pupils equally reactive to light. Oral mucosa is moist NECK: is supple, no gross swelling noted. CHEST:   Diminished breath sounds bilaterally. CVS: S1 and S2 heard, no murmur. Regular rate and rhythm.  ABDOMEN: Soft, bowel sounds are present.  Nonspecific tenderness on palpation. EXTREMITIES: No edema.  Left upper extremity AV fistula in place. CNS: Cranial nerves are intact. No focal motor deficits. SKIN: warm and dry without rashes.  Data Review: I have personally reviewed the following laboratory data and studies,  CBC: Recent Labs  Lab 10/10/23 0201 10/10/23 0206 10/11/23 0419  WBC 17.0*  --  10.6*  NEUTROABS 13.7*  --   --    HGB 10.6* 11.6* 10.7*  HCT 34.2* 34.0* 34.8*  MCV 98.3  --  97.8  PLT 224  --  226   Basic Metabolic Panel: Recent Labs  Lab 10/10/23 0201 10/10/23 0206 10/10/23 0400 10/11/23 0419  NA 136 136  --  139  K 5.1 5.1  --  4.4  CL 100 107  --  99  CO2 16*  --   --  22  GLUCOSE 94 93  --  83  BUN 71* 64*  --  43*  CREATININE 11.10* 12.50*  --  7.96*  CALCIUM  9.1  --   --  9.4  MG  --   --  2.4 2.3  PHOS  --   --  6.5* 7.4*   Liver Function Tests: Recent Labs  Lab 10/10/23 0201  AST 15  ALT 9  ALKPHOS 85  BILITOT 1.2  PROT 7.6  ALBUMIN  3.5   No results for input(s): "LIPASE", "AMYLASE" in the last 168 hours. No results for input(s): "AMMONIA" in the last 168 hours. Cardiac Enzymes: No results for input(s): "CKTOTAL", "CKMB", "CKMBINDEX", "TROPONINI" in the last 168 hours. BNP (last 3 results) Recent Labs    10/10/23 0201  BNP 4,431.1*    ProBNP (last 3 results) No results for input(s): "PROBNP" in the last 8760 hours.  CBG: Recent Labs  Lab 10/10/23 0543  GLUCAP 100*   Recent Results (from the past 240 hours)  Blood Culture (routine x 2)     Status: None (Preliminary result)   Collection Time: 10/10/23  2:01 AM   Specimen: BLOOD  Result Value Ref Range Status   Specimen Description BLOOD BLOOD RIGHT ARM  Final   Special Requests   Final    BOTTLES DRAWN AEROBIC AND ANAEROBIC Blood Culture results may not be optimal due to an inadequate volume of blood received in culture bottles   Culture   Final    NO GROWTH < 12 HOURS Performed at [facility], [address], [place], [place] [address]    Report Status PENDING  Incomplete  Blood Culture (routine x 2)     Status: None (Preliminary result)   Collection Time: 10/10/23  2:06 AM   Specimen: BLOOD  Result Value Ref Range Status   Specimen Description BLOOD BLOOD RIGHT ARM  Final   Special Requests   Final    BOTTLES DRAWN AEROBIC AND ANAEROBIC Blood Culture results may not be optimal due to an  inadequate volume of blood received in culture bottles   Culture   Final    NO GROWTH < 12 HOURS Performed at [facility], [address], [place], [place] [address]    Report Status PENDING  Incomplete  Resp panel by RT-PCR (RSV, Flu A&B, Covid) Anterior Nasal Swab     Status: None   Collection Time: 10/10/23  2:06 AM   Specimen: Anterior Nasal Swab  Result Value Ref Range Status   SARS Coronavirus 2 by RT PCR NEGATIVE NEGATIVE Final   Influenza A by PCR NEGATIVE NEGATIVE Final   Influenza B by PCR NEGATIVE NEGATIVE Final    Comment: (NOTE) The Xpert Xpress SARS-CoV-2/FLU/RSV plus assay is intended as an aid in the diagnosis of influenza from Nasopharyngeal swab specimens and should not be used as a sole basis for treatment. Nasal washings and aspirates are unacceptable for Xpert Xpress SARS-CoV-2/FLU/RSV testing.  Fact Sheet for Patients: BloggerCourse.com  Fact Sheet for Healthcare Providers: SeriousBroker.it  This test is not yet approved or cleared by the [place]  FDA and has been authorized for detection and/or diagnosis of SARS-CoV-2 by FDA under an Emergency Use Authorization (EUA). This EUA will remain in effect (meaning this test can be used) for the duration of the COVID-19 declaration under Section 564(b)(1) of the Act, 21 U.S.C. section 360bbb-3(b)(1), unless the authorization is terminated or revoked.     Resp Syncytial Virus by PCR NEGATIVE NEGATIVE Final    Comment: (NOTE) Fact Sheet for Patients: BloggerCourse.com  Fact Sheet for Healthcare Providers: SeriousBroker.it  This test is not yet approved or cleared by the [place]  FDA and has been authorized for detection and/or diagnosis of SARS-CoV-2 by FDA under an Emergency Use Authorization (EUA). This EUA will remain in effect (meaning this test can be used) for the duration of the COVID-19  declaration under Section 564(b)(1) of the Act, 21 U.S.C. section  360bbb-3(b)(1), unless the authorization is terminated or revoked.  Performed at [facility], [address], [place], [place] [address]   MRSA Next Gen by PCR, Nasal     Status: None   Collection Time: 10/10/23  4:24 PM   Specimen: Nasal Mucosa; Nasal Swab  Result Value Ref Range Status   MRSA by PCR Next Gen NOT DETECTED NOT DETECTED Final    Comment: (NOTE) The GeneXpert MRSA Assay (FDA approved for NASAL specimens only), is one component of a comprehensive MRSA colonization surveillance program. It is not intended to diagnose MRSA infection nor to guide or monitor treatment for MRSA infections. Test performance is not FDA approved in patients less than 70 years old. Performed at [facility], [address] [place], [place] [address]      Studies: DG Chest Port 1 View Result Date: 10/10/2023 CLINICAL DATA:  Questionable sepsis. EXAM: PORTABLE CHEST 1 VIEW COMPARISON:  September 11, 2023 FINDINGS: The cardiac silhouette is moderately enlarged and unchanged in size. There is prominence of the pulmonary vasculature with mild, ill-defined areas of airspace disease seen bilaterally. This is most notable throughout the left lung and right lung base. No pleural effusion or pneumothorax is identified. The visualized skeletal structures are unremarkable. IMPRESSION: 1. Cardiomegaly with mild pulmonary vascular congestion. 2. Mild bilateral airspace disease, which may represent mild pulmonary edema versus multifocal pneumonia. Electronically Signed   By: Virgle Grime M.D.   On: 10/10/2023 03:32      Rosena Conradi, MD  [facility] 10/11/2023  If 7PM-7AM, please contact night-coverage

## 2023-10-11 NOTE — Progress Notes (Addendum)
 Patient started cramping prior to this, attempted to stand up and started crying. Able to convince patient to reamin seated. UF turned off, 100 ML NS bolus given. Patient wants to come off of treatment early due to cramping. Treatment ended per request.

## 2023-10-11 NOTE — Plan of Care (Signed)
  Problem: Education: Goal: Knowledge of General Education information will improve Description: Including pain rating scale, medication(s)/side effects and non-pharmacologic comfort measures Outcome: Progressing   Problem: Clinical Measurements: Goal: Will remain free from infection Outcome: Progressing   Problem: Clinical Measurements: Goal: Diagnostic test results will improve Outcome: Progressing   Problem: Nutrition: Goal: Adequate nutrition will be maintained Outcome: Progressing   Problem: Activity: Goal: Risk for activity intolerance will decrease Outcome: Progressing   Problem: Elimination: Goal: Will not experience complications related to urinary retention Outcome: Progressing

## 2023-10-11 NOTE — Plan of Care (Signed)

## 2023-10-12 ENCOUNTER — Inpatient Hospital Stay (HOSPITAL_COMMUNITY): Payer: 59

## 2023-10-12 DIAGNOSIS — J9601 Acute respiratory failure with hypoxia: Secondary | ICD-10-CM | POA: Diagnosis not present

## 2023-10-12 LAB — HEPATITIS B SURFACE ANTIBODY, QUANTITATIVE: Hep B S AB Quant (Post): 5.1 m[IU]/mL — ABNORMAL LOW

## 2023-10-12 MED ORDER — METHOCARBAMOL 500 MG PO TABS: 500.0000 mg | ORAL_TABLET | Freq: Three times a day (TID) | ORAL | 0 refills | Status: DC | PRN

## 2023-10-12 MED ORDER — CEFPODOXIME PROXETIL 200 MG PO TABS: 200.0000 mg | ORAL_TABLET | Freq: Every day | ORAL | 0 refills | Status: AC

## 2023-10-12 MED ORDER — DOXYCYCLINE HYCLATE 100 MG PO TABS: 100.0000 mg | ORAL_TABLET | Freq: Two times a day (BID) | ORAL | 0 refills | Status: AC

## 2023-10-12 MED ORDER — DICYCLOMINE HCL 10 MG PO CAPS: 10.0000 mg | ORAL_CAPSULE | Freq: Three times a day (TID) | ORAL | 0 refills | Status: DC

## 2023-10-12 NOTE — Progress Notes (Addendum)
Pt d/c this morning. Contacted [facility] to be advised of pt's d/c today and that pt should resume care tomorrow.   Olivia Canter Renal Navigator [phone]  Addendum at 12:15 pm: Advised by [facility]ad[facility] that pt does not come to their clinic. Pt goes to [facility] MWF 7:10 am chair time. Contacted [facility] to be advised of pt's d/c today and that pt should resume care tomorrow.

## 2023-10-12 NOTE — Discharge Summary (Signed)
Physician Discharge Summary  Linda [name]:[id number] DOB: [birth year] DOA: 10/10/2023  PCP: Pcp, No  Admit date: 10/10/2023 Discharge date: 10/12/2023  Admitted From: Home  Discharge disposition: Home    Recommendations for Outpatient Follow-Up:   Follow up with your primary care provider in one week.  Check CBC, BMP, magnesium in the next visit  Discharge Diagnosis:   Principal Problem:   Acute hypoxic respiratory failure (HCC) Active Problems:   ESRD (end stage renal disease) (HCC)   Essential hypertension   Severe sepsis (HCC)   High anion gap metabolic acidosis   CAP (community acquired pneumonia)   Acute on chronic diastolic CHF (congestive heart failure) (HCC)   History of anemia due to chronic kidney disease   Discharge Condition: Improved.  Diet recommendation: Low sodium, heart healthy.  Wound care: None.  Code status: Full.   History of Present Illness:    Linda [name] is a 28 y.o. female with past medical history significant for end-stage renal disease on hemodialysis on Monday, Wednesday, Friday , lupus nephritis, chronic diastolic heart failure,  hypertension, anemia of chronic kidney disease presented hospital with shortness of breath.  Patient had missed hemodialysis on 08/06/2024.  On the same day she developed progressive shortness of breath with cough subjective fever.  In the ED, patient had temperature of 99.8 F, pulse ox was in the high 80% on room air..  Labs were notable for potassium of 5.1 bicarb of 16 with creatinine of 11.1.  Lactate was 1.1.  CBC showed leukocytosis at 17K.  Hemoglobin of 10.6.  Blood cultures with drawn from the ED.  COVID influenza and RSV were negative.  EKG showed sinus tachycardia.  Chest x-ray showed vascular congestion with bilateral opacities representing pulmonary edema versus multifocal pneumonia.  She was initially started on BiPAP for respiratory distress.  Nephrology was consulted for acute volume overload   and was admitted to hospital for further evaluation and treatment.    Hospital Course:   Following conditions were addressed during hospitalization as listed below,  Acute hypoxic respiratory failure due to acute on chronic diastolic heart failure:  Missed hemodialysis.   Hypoxic on presentation with pulse ox of 80% on room air.  Required BiPAP initially.  Initial chest x-ray with vascular congestion.  On Lasix 80 mg 3 times daily as outpatient.  Received Lasix 80 mg IV x 1 in the ED.  Nephrology was consulted for hemodialysis.  During hospitalization patient received IV Lasix 60 mg 3 times daily.  Has received hemodialysis as well.  Procalcitonin of 0.6.  BNP of 4431.    Severe sepsis due to community-acquired pneumonia:  Patient was symptomatic with shortness of breath and cough and fever with chest x-ray with bilateral opacities.  Had significant leukocytosis with tachycardia and tachypnea on presentation with hypoxic respiratory failure suggestive of sepsis.  Blood cultures negative in less than 12 hours.  Received azithromycin and Rocephin.    Flu COVID and RSV was negative.  Afebrile at this time.  Will continue Vantin and doxycycline on discharge to complete 5-day course.   Anion gap metabolic acidosis: Secondary to missed hemodialysis.  Nephrology has been consulted.  Continue sodium bicarb.  CO2 of 22 at this time.    ESRD: on HD (schedule: Monday, Wednesday, Friday).   Hemodialysis during hospitalization and will continue as outpatient.    Essential Hypertension:   Patient is on Norvasc, Coreg, Lasix, losartan at home.  Blood pressure better today.  Anemia of chronic kidney  disease:  baseline hgb range 10-12, latest hemoglobin at 10.6.   Disposition.  At this time, patient is stable for disposition home with outpatient PCP and nephrology follow-up  Medical Consultants:   Nephrology  Procedures:    Hemodialysis Subjective:   Today, patient was seen and examined at  bedside.  Denies any nausea vomiting fever chills or rigor.  Has some abdominal cramping.  Discharge Exam:   Vitals:   10/12/23 0312 10/12/23 0834  BP: (!) 131/95 (!) 135/95  Pulse: 88 91  Resp: 19 16  Temp: 98.6 F (37 C) 98.5 F (36.9 C)  SpO2: 97% 95%   Vitals:   10/11/23 2005 10/12/23 0312 10/12/23 0500 10/12/23 0834  BP: (!) 174/131 (!) 131/95  (!) 135/95  Pulse: 89 88  91  Resp: 18 19  16   Temp: 98.2 F (36.8 C) 98.6 F (37 C)  98.5 F (36.9 C)  TempSrc: Oral Oral  Oral  SpO2: 96% 97%  95%  Weight:   67.6 kg   Height:       Body mass index is 24.05 kg/m.   General: Alert awake, not in obvious distress HENT:  Diminished breath sounds bilaterally. No crackles or wheezes.  CVS: S1 &S2 heard. No murmur.  Regular rate and rhythm. Abdomen: Soft, nonspecific tenderness noted.  Bowel sounds are heard.   Extremities: No cyanosis, clubbing or edema.  Peripheral pulses are palpable.  Left upper extremity AV fistula in place. Psych: Alert, awake and oriented, normal mood CNS:  No cranial nerve deficits.  Power equal in all extremities.   Skin: Warm and dry.  No rashes noted.  The results of significant diagnostics from this hospitalization (including imaging, microbiology, ancillary and laboratory) are listed below for reference.     Diagnostic Studies:   DG Chest Port 1 View Result Date: 10/10/2023 CLINICAL DATA:  Questionable sepsis. EXAM: PORTABLE CHEST 1 VIEW COMPARISON:  September 11, 2023 FINDINGS: The cardiac silhouette is moderately enlarged and unchanged in size. There is prominence of the pulmonary vasculature with mild, ill-defined areas of airspace disease seen bilaterally. This is most notable throughout the left lung and right lung base. No pleural effusion or pneumothorax is identified. The visualized skeletal structures are unremarkable. IMPRESSION: 1. Cardiomegaly with mild pulmonary vascular congestion. 2. Mild bilateral airspace disease, which may represent  mild pulmonary edema versus multifocal pneumonia. Electronically Signed   By: Aram Candela M.D.   On: 10/10/2023 03:32     Labs:   Basic Metabolic Panel: Recent Labs  Lab 10/10/23 0201 10/10/23 0206 10/10/23 0400 10/11/23 0419  NA 136 136  --  139  K 5.1 5.1  --  4.4  CL 100 107  --  99  CO2 16*  --   --  22  GLUCOSE 94 93  --  83  BUN 71* 64*  --  43*  CREATININE 11.10* 12.50*  --  7.96*  CALCIUM 9.1  --   --  9.4  MG  --   --  2.4 2.3  PHOS  --   --  6.5* 7.4*   GFR Estimated Creatinine Clearance: 9.9 mL/min (A) (by C-G formula based on SCr of 7.96 mg/dL (H)). Liver Function Tests: Recent Labs  Lab 10/10/23 0201  AST 15  ALT 9  ALKPHOS 85  BILITOT 1.2  PROT 7.6  ALBUMIN 3.5   No results for input(s): "LIPASE", "AMYLASE" in the last 168 hours. No results for input(s): "AMMONIA" in the last 168 hours. Coagulation  profile Recent Labs  Lab 10/10/23 0201  INR 1.2    CBC: Recent Labs  Lab 10/10/23 0201 10/10/23 0206 10/11/23 0419  WBC 17.0*  --  10.6*  NEUTROABS 13.7*  --   --   HGB 10.6* 11.6* 10.7*  HCT 34.2* 34.0* 34.8*  MCV 98.3  --  97.8  PLT 224  --  226   Cardiac Enzymes: No results for input(s): "CKTOTAL", "CKMB", "CKMBINDEX", "TROPONINI" in the last 168 hours. BNP: Invalid input(s): "POCBNP" CBG: Recent Labs  Lab 10/10/23 0543  GLUCAP 100*   D-Dimer No results for input(s): "DDIMER" in the last 72 hours. Hgb A1c No results for input(s): "HGBA1C" in the last 72 hours. Lipid Profile No results for input(s): "CHOL", "HDL", "LDLCALC", "TRIG", "CHOLHDL", "LDLDIRECT" in the last 72 hours. Thyroid function studies No results for input(s): "TSH", "T4TOTAL", "T3FREE", "THYROIDAB" in the last 72 hours.  Invalid input(s): "FREET3" Anemia work up No results for input(s): "VITAMINB12", "FOLATE", "FERRITIN", "TIBC", "IRON", "RETICCTPCT" in the last 72 hours. Microbiology Recent Results (from the past 240 hours)  Blood Culture (routine x  2)     Status: None (Preliminary result)   Collection Time: 10/10/23  2:01 AM   Specimen: BLOOD  Result Value Ref Range Status   Specimen Description BLOOD BLOOD RIGHT ARM  Final   Special Requests   Final    BOTTLES DRAWN AEROBIC AND ANAEROBIC Blood Culture results may not be optimal due to an inadequate volume of blood received in culture bottles   Culture   Final    NO GROWTH 2 DAYS Performed at [facility], [address], [place], [place] [address]    Report Status PENDING  Incomplete  Blood Culture (routine x 2)     Status: None (Preliminary result)   Collection Time: 10/10/23  2:06 AM   Specimen: BLOOD  Result Value Ref Range Status   Specimen Description BLOOD BLOOD RIGHT ARM  Final   Special Requests   Final    BOTTLES DRAWN AEROBIC AND ANAEROBIC Blood Culture results may not be optimal due to an inadequate volume of blood received in culture bottles   Culture   Final    NO GROWTH 2 DAYS Performed at [facility], [address], [place], [place] [address]    Report Status PENDING  Incomplete  Resp panel by RT-PCR (RSV, Flu A&B, Covid) Anterior Nasal Swab     Status: None   Collection Time: 10/10/23  2:06 AM   Specimen: Anterior Nasal Swab  Result Value Ref Range Status   SARS Coronavirus 2 by RT PCR NEGATIVE NEGATIVE Final   Influenza A by PCR NEGATIVE NEGATIVE Final   Influenza B by PCR NEGATIVE NEGATIVE Final    Comment: (NOTE) The Xpert Xpress SARS-CoV-2/FLU/RSV plus assay is intended as an aid in the diagnosis of influenza from Nasopharyngeal swab specimens and should not be used as a sole basis for treatment. Nasal washings and aspirates are unacceptable for Xpert Xpress SARS-CoV-2/FLU/RSV testing.  Fact Sheet for Patients: BloggerCourse.com  Fact Sheet for Healthcare Providers: SeriousBroker.it  This test is not yet approved or cleared by the [place] FDA and has been authorized for  detection and/or diagnosis of SARS-CoV-2 by FDA under an Emergency Use Authorization (EUA). This EUA will remain in effect (meaning this test can be used) for the duration of the COVID-19 declaration under Section 564(b)(1) of the Act, 21 U.S.C. section 360bbb-3(b)(1), unless the authorization is terminated or revoked.  Resp Syncytial Virus by PCR NEGATIVE NEGATIVE Final    Comment: (NOTE) Fact Sheet for Patients: BloggerCourse.com  Fact Sheet for Healthcare Providers: SeriousBroker.it  This test is not yet approved or cleared by the [place] FDA and has been authorized for detection and/or diagnosis of SARS-CoV-2 by FDA under an Emergency Use Authorization (EUA). This EUA will remain in effect (meaning this test can be used) for the duration of the COVID-19 declaration under Section 564(b)(1) of the Act, 21 U.S.C. section 360bbb-3(b)(1), unless the authorization is terminated or revoked.  Performed at [facility], [address], [place], [place] [address]   MRSA Next Gen by PCR, Nasal     Status: None   Collection Time: 10/10/23  4:24 PM   Specimen: Nasal Mucosa; Nasal Swab  Result Value Ref Range Status   MRSA by PCR Next Gen NOT DETECTED NOT DETECTED Final    Comment: (NOTE) The GeneXpert MRSA Assay (FDA approved for NASAL specimens only), is one component of a comprehensive MRSA colonization surveillance program. It is not intended to diagnose MRSA infection nor to guide or monitor treatment for MRSA infections. Test performance is not FDA approved in patients less than 37 years old. Performed at [facility], [address] [place], [place] [address]      Discharge Instructions:   Discharge Instructions     Diet - low sodium heart healthy   Complete by: As directed    Increase activity slowly   Complete by: As directed       Allergies as of 10/12/2023       Reactions   Lisinopril  Anaphylaxis   Angioedema   Gabapentin Other (See Comments)   Reaction: Tremor (intolerance); tremor   Ativan [lorazepam] Other (See Comments)   Somnolent with 1mg  ativan at [facility] Nov 2019   Hydroxychloroquine Other (See Comments)   Pt reports making her skin peel really bad   Vicodin [hydrocodone-acetaminophen] Itching, Rash        Medication List     STOP taking these medications    ibuprofen 800 MG tablet Commonly known as: ADVIL   methylPREDNISolone 4 MG Tbpk tablet Commonly known as: MEDROL DOSEPAK       TAKE these medications    acetaminophen 325 MG tablet Commonly known as: TYLENOL Take 2 tablets (650 mg total) by mouth every 6 (six) hours as needed for mild pain (pain score 1-3) or moderate pain (pain score 4-6) (or Fever >/= 101).   amLODipine 10 MG tablet Commonly known as: NORVASC Take 10 mg by mouth daily.   calcitRIOL 0.25 MCG capsule Commonly known as: ROCALTROL Take 1 tablet by mouth daily.   carvedilol 25 MG tablet Commonly known as: COREG Take 1 tablet (25 mg total) by mouth 2 (two) times daily with a meal.   cefpodoxime 200 MG tablet Commonly known as: VANTIN Take 1 tablet (200 mg total) by mouth daily for 3 days.   diclofenac Sodium 1 % Gel Commonly known as: Voltaren Apply 2 g topically 4 (four) times daily.   dicyclomine 10 MG capsule Commonly known as: BENTYL Take 1 capsule (10 mg total) by mouth 4 (four) times daily -  before meals and at bedtime for 5 days. Abdominal cramps.   doxycycline 100 MG tablet Commonly known as: VIBRA-TABS Take 1 tablet (100 mg total) by mouth 2 (two) times daily for 3 days.   furosemide 80 MG tablet Commonly known as: LASIX Take 80 mg by mouth  3 (three) times daily.   losartan 50 MG tablet Commonly known as: COZAAR Take 1 tablet (50 mg total) by mouth daily.   medroxyPROGESTERone 150 MG/ML injection Commonly known as: DEPO-PROVERA Inject 1 mL (150 mg total) into the muscle every 3 (three)  months.   methocarbamol 500 MG tablet Commonly known as: ROBAXIN Take 1 tablet (500 mg total) by mouth every 8 (eight) hours as needed for muscle spasms.   Narcan 4 MG/0.1ML Liqd nasal spray kit Generic drug: naloxone Place 1 spray into the nose once. If needed for emergency overdose.   pantoprazole 40 MG tablet Commonly known as: PROTONIX Take 1 tablet (40 mg total) by mouth daily as needed (for acid reflux).   Renvela 2.4 g Pack Generic drug: sevelamer carbonate Take 2.4 g by mouth See admin instructions. Take with a large meal once a day to once every other day.   Safety Seal Miscellaneous Misc Use on the alternate nights when you are not applying tretinoin (Tuesday, Thursday, Saturday, and Sunday)   sodium bicarbonate 650 MG tablet Take 1 tablet (650 mg total) by mouth 2 (two) times daily.   tiZANidine 4 MG tablet Commonly known as: ZANAFLEX Take 4 mg by mouth 3 (three) times daily.   Vitamin D (Ergocalciferol) 1.25 MG (50000 UNIT) Caps capsule Commonly known as: DRISDOL ergocalciferol (vitamin D2) 1,250 mcg (50,000 unit) capsule TAKE 1 CAPSULE BY MOUTH WEEKLY        Follow-up Information     primary care provider Follow up in 1 week(s).                   Time coordinating discharge: 39 minutes  Signed:  Catriona Dillenbeck  [facility] 10/12/2023, 9:17 AM

## 2023-10-12 NOTE — Plan of Care (Signed)

## 2023-10-12 NOTE — Progress Notes (Signed)
[facility] Progress Note   Subjective:    Seen and examined patient at bedside. Noted she was c/o cramping near end of treatment causing treatment to be aborted early. She tolerated net UF 2.9L. She reports feeling better after yesterday's treatment. Discussed with Hospitalist. Plan for discharge today. Requested bedside RN to obtain a standing weight to determine whether her EDW needs to be adjusted.  Objective Vitals:   10/11/23 2005 10/12/23 0312 10/12/23 0500 10/12/23 0834  BP: (!) 174/131 (!) 131/95  (!) 135/95  Pulse: 89 88  91  Resp: 18 19  16   Temp: 98.2 F (36.8 C) 98.6 F (37 C)  98.5 F (36.9 C)  TempSrc: Oral Oral  Oral  SpO2: 96% 97%  95%  Weight:   67.6 kg   Height:       Physical Exam General: Awake, alert, on RA, NAD Heart: S1 and S2; No MRGs Lungs: Clear throughout; Breathing unlabored; No wheezing, rales, or rhonchi Abdomen: Soft and non-tender Extremities: No LE edema Dialysis Access: L AVF (+) B/T   Filed Weights   10/11/23 1503 10/11/23 1904 10/12/23 0500  Weight: 71.1 kg 68.3 kg 67.6 kg    Intake/Output Summary (Last 24 hours) at 10/12/2023 0936 Last data filed at 10/12/2023 0600 Gross per 24 hour  Intake 120 ml  Output 3418 ml  Net -3298 ml    Additional Objective Labs: Basic Metabolic Panel: Recent Labs  Lab 10/10/23 0201 10/10/23 0206 10/10/23 0400 10/11/23 0419  NA 136 136  --  139  K 5.1 5.1  --  4.4  CL 100 107  --  99  CO2 16*  --   --  22  GLUCOSE 94 93  --  83  BUN 71* 64*  --  43*  CREATININE 11.10* 12.50*  --  7.96*  CALCIUM 9.1  --   --  9.4  PHOS  --   --  6.5* 7.4*   Liver Function Tests: Recent Labs  Lab 10/10/23 0201  AST 15  ALT 9  ALKPHOS 85  BILITOT 1.2  PROT 7.6  ALBUMIN 3.5   No results for input(s): "LIPASE", "AMYLASE" in the last 168 hours. CBC: Recent Labs  Lab 10/10/23 0201 10/10/23 0206 10/11/23 0419  WBC 17.0*  --  10.6*  NEUTROABS 13.7*  --   --   HGB 10.6* 11.6* 10.7*   HCT 34.2* 34.0* 34.8*  MCV 98.3  --  97.8  PLT 224  --  226   Blood Culture    Component Value Date/Time   SDES BLOOD BLOOD RIGHT ARM 10/10/2023 0206   SPECREQUEST  10/10/2023 0206    BOTTLES DRAWN AEROBIC AND ANAEROBIC Blood Culture results may not be optimal due to an inadequate volume of blood received in culture bottles   CULT  10/10/2023 0206    NO GROWTH 2 DAYS Performed at [facility], [address] [place], [place] [address]    REPTSTATUS PENDING 10/10/2023 0206    Cardiac Enzymes: No results for input(s): "CKTOTAL", "CKMB", "CKMBINDEX", "TROPONINI" in the last 168 hours. CBG: Recent Labs  Lab 10/10/23 0543  GLUCAP 100*   Iron Studies: No results for input(s): "IRON", "TIBC", "TRANSFERRIN", "FERRITIN" in the last 72 hours. Lab Results  Component Value Date   INR 1.2 10/10/2023   INR 1.0 03/27/2019   INR 1.01 01/25/2018   Studies/Results: No results found.  Medications:  azithromycin 500 mg (10/11/23 2140)   cefTRIAXone (ROCEPHIN)  IV 1 g (  10/11/23 2326)    amLODipine  10 mg Oral Daily   carvedilol  25 mg Oral BID WC   dicyclomine  10 mg Oral TID AC & HS   furosemide  60 mg Intravenous TID   influenza vac split trivalent PF  0.5 mL Intramuscular Tomorrow-1000   losartan  50 mg Oral Daily   pneumococcal 20-valent conjugate vaccine  0.5 mL Intramuscular Tomorrow-1000   sodium bicarbonate  650 mg Oral BID   sodium chloride flush  10-40 mL Intracatheter Q12H    Dialysis Orders: [facility] MWF From jan 2025 --> 3.5h   350/500   73.9kg   2/2 bath  LUA AVF   Heparin none   Assessment/Plan: Acute hypoxic resp failure - w/ multifocal infiltrates could be infectious and/or fluid overload. Getting IV abx per pmd. S/p 3h bipap at admit and dc'd due to GI symptoms. Pt now reports her breathing has improved since yesterday's treatment. ESRD - on HD MWF. Received HD yesterday and 2.9L removed. Now back on routine schedule. Requested the bedside RN to obtain a  standing weight today to determine if her EDW needs to be adjusted. HTN - chronic issue, cont home meds.  Volume - Volume is improving. Noted patient misses HD frequently at her outpatient facility. Discussed HD compliance, restricting fluids, and low sodium diet. Awaiting CXR this AM. Anemia of esrd - Hb 10-12 here. No esa needs for now.  Secondary hyperparathyroidism - CCa in range and phos a bit high. Cont binders w/ meals.  Dispo - Okay for discharge from renal standpoint. Discussed with Hospitalist. She can resume HD tomorrow in outpatient.  Salome Holmes, NP [facility] 10/12/2023,9:36 AM  LOS: 2 days

## 2023-10-13 DIAGNOSIS — Z992 Dependence on renal dialysis: Secondary | ICD-10-CM | POA: Diagnosis not present

## 2023-10-13 DIAGNOSIS — N2581 Secondary hyperparathyroidism of renal origin: Secondary | ICD-10-CM | POA: Diagnosis not present

## 2023-10-13 DIAGNOSIS — D509 Iron deficiency anemia, unspecified: Secondary | ICD-10-CM | POA: Diagnosis not present

## 2023-10-13 DIAGNOSIS — D631 Anemia in chronic kidney disease: Secondary | ICD-10-CM | POA: Diagnosis not present

## 2023-10-13 DIAGNOSIS — N186 End stage renal disease: Secondary | ICD-10-CM | POA: Diagnosis not present

## 2023-10-13 NOTE — Discharge Planning (Signed)
[facility] Patient Discharge Orders- [facility] CLINIC: [facility]  Patient's name: Linda [name] Admit/DC Dates: 10/10/2023 - 10/12/2023  Discharge Diagnoses: Acute hypoxic respiratory failure 2nd acute on chronic CHF/missed HD - weaned off Bipap, pushed UF, lowered EDW. See below  CAP - Finishing PO ABX courses, see below HTN - Continue home meds   Aranesp: Given: No     Last Hgb: 10.7 PRBC's Given: No  ESA dose for discharge: Continue mircera 150 mcg IV q 2 weeks  IV Iron dose at discharge: N/A  Heparin change: No  EDW Change: Yes New EDW: Lower to 68.9kg  Bath Change: No  Access intervention/Change: No  Hectorol change: No  Discharge Labs: Calcium 9.4 Phosphorus 7.4 K+ 4.4  IV Antibiotics: Yes Details: CAP: On Cefpodoxime 200mg  daily X 3 days and Doxycyline 100mg  BID X 3 days  On Coumadin?: No   OTHER/APPTS/LAB ORDERS:    D/C Meds to be reconciled by nurse after every discharge.  Completed By: Salome Holmes, NP   Reviewed by: MD:______ RN_______

## 2023-10-15 DIAGNOSIS — D631 Anemia in chronic kidney disease: Secondary | ICD-10-CM | POA: Diagnosis not present

## 2023-10-15 DIAGNOSIS — D509 Iron deficiency anemia, unspecified: Secondary | ICD-10-CM | POA: Diagnosis not present

## 2023-10-15 DIAGNOSIS — N186 End stage renal disease: Secondary | ICD-10-CM | POA: Diagnosis not present

## 2023-10-15 DIAGNOSIS — N2581 Secondary hyperparathyroidism of renal origin: Secondary | ICD-10-CM | POA: Diagnosis not present

## 2023-10-15 DIAGNOSIS — Z992 Dependence on renal dialysis: Secondary | ICD-10-CM | POA: Diagnosis not present

## 2023-10-15 LAB — CULTURE, BLOOD (ROUTINE X 2)
Culture: NO GROWTH
Culture: NO GROWTH

## 2023-10-20 DIAGNOSIS — N2581 Secondary hyperparathyroidism of renal origin: Secondary | ICD-10-CM | POA: Diagnosis not present

## 2023-10-20 DIAGNOSIS — N186 End stage renal disease: Secondary | ICD-10-CM | POA: Diagnosis not present

## 2023-10-20 DIAGNOSIS — Z992 Dependence on renal dialysis: Secondary | ICD-10-CM | POA: Diagnosis not present

## 2023-10-20 DIAGNOSIS — D631 Anemia in chronic kidney disease: Secondary | ICD-10-CM | POA: Diagnosis not present

## 2023-10-20 DIAGNOSIS — D509 Iron deficiency anemia, unspecified: Secondary | ICD-10-CM | POA: Diagnosis not present

## 2023-10-24 ENCOUNTER — Other Ambulatory Visit: Payer: Self-pay

## 2023-10-24 ENCOUNTER — Emergency Department (HOSPITAL_COMMUNITY): Payer: 59

## 2023-10-24 ENCOUNTER — Encounter (HOSPITAL_COMMUNITY): Payer: Self-pay

## 2023-10-24 ENCOUNTER — Inpatient Hospital Stay (HOSPITAL_COMMUNITY): Payer: 59

## 2023-10-24 ENCOUNTER — Inpatient Hospital Stay (HOSPITAL_COMMUNITY)
Admission: EM | Admit: 2023-10-24 | Discharge: 2023-10-28 | DRG: 291 | Disposition: A | Discharge status: Home / Self Care | Payer: 59 | Attending: Internal Medicine | Admitting: Internal Medicine

## 2023-10-24 DIAGNOSIS — F17210 Nicotine dependence, cigarettes, uncomplicated: Secondary | ICD-10-CM | POA: Diagnosis present

## 2023-10-24 DIAGNOSIS — J811 Chronic pulmonary edema: Secondary | ICD-10-CM | POA: Diagnosis not present

## 2023-10-24 DIAGNOSIS — R7989 Other specified abnormal findings of blood chemistry: Secondary | ICD-10-CM | POA: Diagnosis not present

## 2023-10-24 DIAGNOSIS — I10 Essential (primary) hypertension: Secondary | ICD-10-CM | POA: Diagnosis not present

## 2023-10-24 DIAGNOSIS — Z992 Dependence on renal dialysis: Secondary | ICD-10-CM

## 2023-10-24 DIAGNOSIS — E8779 Other fluid overload: Secondary | ICD-10-CM | POA: Diagnosis not present

## 2023-10-24 DIAGNOSIS — R6889 Other general symptoms and signs: Secondary | ICD-10-CM | POA: Diagnosis not present

## 2023-10-24 DIAGNOSIS — K219 Gastro-esophageal reflux disease without esophagitis: Secondary | ICD-10-CM | POA: Diagnosis present

## 2023-10-24 DIAGNOSIS — F419 Anxiety disorder, unspecified: Secondary | ICD-10-CM | POA: Diagnosis not present

## 2023-10-24 DIAGNOSIS — J9601 Acute respiratory failure with hypoxia: Secondary | ICD-10-CM | POA: Diagnosis present

## 2023-10-24 DIAGNOSIS — R0602 Shortness of breath: Secondary | ICD-10-CM | POA: Diagnosis present

## 2023-10-24 DIAGNOSIS — Z8269 Family history of other diseases of the musculoskeletal system and connective tissue: Secondary | ICD-10-CM

## 2023-10-24 DIAGNOSIS — Z91041 Radiographic dye allergy status: Secondary | ICD-10-CM

## 2023-10-24 DIAGNOSIS — Z5982 Transportation insecurity: Secondary | ICD-10-CM

## 2023-10-24 DIAGNOSIS — I43 Cardiomyopathy in diseases classified elsewhere: Secondary | ICD-10-CM | POA: Diagnosis not present

## 2023-10-24 DIAGNOSIS — Z885 Allergy status to narcotic agent status: Secondary | ICD-10-CM

## 2023-10-24 DIAGNOSIS — Z1152 Encounter for screening for COVID-19: Secondary | ICD-10-CM | POA: Diagnosis not present

## 2023-10-24 DIAGNOSIS — I5032 Chronic diastolic (congestive) heart failure: Secondary | ICD-10-CM

## 2023-10-24 DIAGNOSIS — I110 Hypertensive heart disease with heart failure: Secondary | ICD-10-CM | POA: Diagnosis not present

## 2023-10-24 DIAGNOSIS — Z95810 Presence of automatic (implantable) cardiac defibrillator: Secondary | ICD-10-CM | POA: Diagnosis not present

## 2023-10-24 DIAGNOSIS — I5043 Acute on chronic combined systolic (congestive) and diastolic (congestive) heart failure: Secondary | ICD-10-CM | POA: Diagnosis present

## 2023-10-24 DIAGNOSIS — Z79899 Other long term (current) drug therapy: Secondary | ICD-10-CM

## 2023-10-24 DIAGNOSIS — I5023 Acute on chronic systolic (congestive) heart failure: Secondary | ICD-10-CM | POA: Diagnosis not present

## 2023-10-24 DIAGNOSIS — Z113 Encounter for screening for infections with a predominantly sexual mode of transmission: Secondary | ICD-10-CM

## 2023-10-24 DIAGNOSIS — Z833 Family history of diabetes mellitus: Secondary | ICD-10-CM

## 2023-10-24 DIAGNOSIS — Z888 Allergy status to other drugs, medicaments and biological substances status: Secondary | ICD-10-CM

## 2023-10-24 DIAGNOSIS — I132 Hypertensive heart and chronic kidney disease with heart failure and with stage 5 chronic kidney disease, or end stage renal disease: Principal | ICD-10-CM | POA: Diagnosis present

## 2023-10-24 DIAGNOSIS — D631 Anemia in chronic kidney disease: Secondary | ICD-10-CM | POA: Diagnosis present

## 2023-10-24 DIAGNOSIS — Z793 Long term (current) use of hormonal contraceptives: Secondary | ICD-10-CM

## 2023-10-24 DIAGNOSIS — M3214 Glomerular disease in systemic lupus erythematosus: Secondary | ICD-10-CM | POA: Diagnosis present

## 2023-10-24 DIAGNOSIS — N186 End stage renal disease: Secondary | ICD-10-CM | POA: Diagnosis present

## 2023-10-24 DIAGNOSIS — I2489 Other forms of acute ischemic heart disease: Secondary | ICD-10-CM | POA: Diagnosis not present

## 2023-10-24 DIAGNOSIS — N2581 Secondary hyperparathyroidism of renal origin: Secondary | ICD-10-CM | POA: Diagnosis present

## 2023-10-24 DIAGNOSIS — I3139 Other pericardial effusion (noninflammatory): Principal | ICD-10-CM

## 2023-10-24 DIAGNOSIS — E8721 Acute metabolic acidosis: Secondary | ICD-10-CM | POA: Diagnosis present

## 2023-10-24 DIAGNOSIS — F17290 Nicotine dependence, other tobacco product, uncomplicated: Secondary | ICD-10-CM | POA: Diagnosis present

## 2023-10-24 DIAGNOSIS — E785 Hyperlipidemia, unspecified: Secondary | ICD-10-CM | POA: Diagnosis present

## 2023-10-24 DIAGNOSIS — G8929 Other chronic pain: Secondary | ICD-10-CM | POA: Diagnosis present

## 2023-10-24 DIAGNOSIS — J810 Acute pulmonary edema: Secondary | ICD-10-CM | POA: Diagnosis not present

## 2023-10-24 DIAGNOSIS — J90 Pleural effusion, not elsewhere classified: Secondary | ICD-10-CM

## 2023-10-24 DIAGNOSIS — Z8249 Family history of ischemic heart disease and other diseases of the circulatory system: Secondary | ICD-10-CM

## 2023-10-24 DIAGNOSIS — Z841 Family history of disorders of kidney and ureter: Secondary | ICD-10-CM

## 2023-10-24 DIAGNOSIS — R0989 Other specified symptoms and signs involving the circulatory and respiratory systems: Secondary | ICD-10-CM | POA: Diagnosis not present

## 2023-10-24 DIAGNOSIS — Z20828 Contact with and (suspected) exposure to other viral communicable diseases: Secondary | ICD-10-CM | POA: Diagnosis present

## 2023-10-24 DIAGNOSIS — I120 Hypertensive chronic kidney disease with stage 5 chronic kidney disease or end stage renal disease: Secondary | ICD-10-CM | POA: Diagnosis not present

## 2023-10-24 DIAGNOSIS — Z91158 Patient's noncompliance with renal dialysis for other reason: Secondary | ICD-10-CM

## 2023-10-24 DIAGNOSIS — Z8701 Personal history of pneumonia (recurrent): Secondary | ICD-10-CM | POA: Diagnosis not present

## 2023-10-24 LAB — CBC
HCT: 33.4 % — ABNORMAL LOW (ref 36.0–46.0)
HCT: 29.7 % — ABNORMAL LOW (ref 36.0–46.0)
Hemoglobin: 10.3 g/dL — ABNORMAL LOW (ref 12.0–15.0)
Hemoglobin: 9.2 g/dL — ABNORMAL LOW (ref 12.0–15.0)
MCH: 29.9 pg (ref 26.0–34.0)
MCH: 30.1 pg (ref 26.0–34.0)
MCHC: 30.8 g/dL (ref 30.0–36.0)
MCHC: 31 g/dL (ref 30.0–36.0)
MCV: 96.8 fL (ref 80.0–100.0)
MCV: 97.1 fL (ref 80.0–100.0)
Platelets: 234 10*3/uL (ref 150–400)
Platelets: 227 10*3/uL (ref 150–400)
RBC: 3.45 MIL/uL — ABNORMAL LOW (ref 3.87–5.11)
RBC: 3.06 MIL/uL — ABNORMAL LOW (ref 3.87–5.11)
RDW: 16.8 % — ABNORMAL HIGH (ref 11.5–15.5)
RDW: 16.9 % — ABNORMAL HIGH (ref 11.5–15.5)
WBC: 9.5 10*3/uL (ref 4.0–10.5)
WBC: 10.4 10*3/uL (ref 4.0–10.5)
nRBC: 0 % (ref 0.0–0.2)
nRBC: 0 % (ref 0.0–0.2)

## 2023-10-24 LAB — CREATININE, SERUM
Creatinine, Ser: 12.9 mg/dL — ABNORMAL HIGH (ref 0.44–1.00)
GFR, Estimated: 4 mL/min — ABNORMAL LOW (ref >60)

## 2023-10-24 LAB — TROPONIN I (HIGH SENSITIVITY)
Troponin I (High Sensitivity): 51 ng/L — ABNORMAL HIGH (ref <18)
Troponin I (High Sensitivity): 45 ng/L — ABNORMAL HIGH (ref <18)

## 2023-10-24 LAB — BASIC METABOLIC PANEL
Anion gap: 20 — ABNORMAL HIGH (ref 5–15)
BUN: 65 mg/dL — ABNORMAL HIGH (ref 6–20)
CO2: 17 mmol/L — ABNORMAL LOW (ref 22–32)
Calcium: 9.3 mg/dL (ref 8.9–10.3)
Chloride: 104 mmol/L (ref 98–111)
Creatinine, Ser: 12.34 mg/dL — ABNORMAL HIGH (ref 0.44–1.00)
GFR, Estimated: 4 mL/min — ABNORMAL LOW (ref >60)
Glucose, Bld: 82 mg/dL (ref 70–99)
Potassium: 4.6 mmol/L (ref 3.5–5.1)
Sodium: 141 mmol/L (ref 135–145)

## 2023-10-24 LAB — HEPATIC FUNCTION PANEL
ALT: 8 U/L (ref 0–44)
AST: 14 U/L — ABNORMAL LOW (ref 15–41)
Albumin: 2.9 g/dL — ABNORMAL LOW (ref 3.5–5.0)
Alkaline Phosphatase: 63 U/L (ref 38–126)
Bilirubin, Direct: 0.1 mg/dL (ref 0.0–0.2)
Indirect Bilirubin: 0.8 mg/dL (ref 0.3–0.9)
Total Bilirubin: 0.9 mg/dL (ref 0.0–1.2)
Total Protein: 7.3 g/dL (ref 6.5–8.1)

## 2023-10-24 LAB — HCG, SERUM, QUALITATIVE: Preg, Serum: NEGATIVE

## 2023-10-24 LAB — TSH: TSH: 1.081 u[IU]/mL (ref 0.350–4.500)

## 2023-10-24 LAB — T4, FREE: Free T4: 0.79 ng/dL (ref 0.61–1.12)

## 2023-10-24 MED ORDER — OXYCODONE HCL 5 MG PO TABS
5.0000 mg | ORAL_TABLET | Freq: Once | ORAL | Status: AC
  Administered 2023-10-24: 5 mg via ORAL
  Filled 2023-10-24: qty 1

## 2023-10-24 MED ORDER — ALTEPLASE 2 MG IJ SOLR: 2.0000 mg | Freq: Once | INTRAMUSCULAR | Status: DC | PRN

## 2023-10-24 MED ORDER — LIDOCAINE-PRILOCAINE 2.5-2.5 % EX CREA: 1.0000 | TOPICAL_CREAM | CUTANEOUS | Status: DC | PRN

## 2023-10-24 MED ORDER — SODIUM CHLORIDE 0.9% FLUSH
3.0000 mL | INTRAVENOUS | Status: DC | PRN
  Administered 2023-10-26: 3 mL via INTRAVENOUS

## 2023-10-24 MED ORDER — OXYCODONE HCL 5 MG PO TABS
15.0000 mg | ORAL_TABLET | Freq: Four times a day (QID) | ORAL | Status: DC | PRN
  Administered 2023-10-24 – 2023-10-28 (×11): 15 mg via ORAL
  Filled 2023-10-24 (×12): qty 3

## 2023-10-24 MED ORDER — HYDRALAZINE HCL 20 MG/ML IJ SOLN
10.0000 mg | Freq: Four times a day (QID) | INTRAMUSCULAR | Status: DC | PRN
  Administered 2023-10-25: 10 mg via INTRAVENOUS
  Filled 2023-10-24: qty 1

## 2023-10-24 MED ORDER — SODIUM CHLORIDE 0.9% FLUSH
3.0000 mL | Freq: Two times a day (BID) | INTRAVENOUS | Status: DC
  Administered 2023-10-25 – 2023-10-27 (×2): 3 mL via INTRAVENOUS

## 2023-10-24 MED ORDER — MORPHINE SULFATE (PF) 2 MG/ML IV SOLN
2.0000 mg | INTRAVENOUS | Status: DC | PRN
  Administered 2023-10-24 – 2023-10-26 (×7): 2 mg via INTRAVENOUS
  Filled 2023-10-24 (×7): qty 1

## 2023-10-24 MED ORDER — CARVEDILOL 25 MG PO TABS
25.0000 mg | ORAL_TABLET | Freq: Two times a day (BID) | ORAL | Status: DC
  Administered 2023-10-24 – 2023-10-28 (×8): 25 mg via ORAL
  Filled 2023-10-24 (×8): qty 1

## 2023-10-24 MED ORDER — SODIUM CHLORIDE 0.9 % IV SOLN: 250.0000 mL | INTRAVENOUS | Status: AC | PRN

## 2023-10-24 MED ORDER — CHLORHEXIDINE GLUCONATE CLOTH 2 % EX PADS: 6.0000 | MEDICATED_PAD | Freq: Every day | CUTANEOUS | Status: DC

## 2023-10-24 MED ORDER — ANTICOAGULANT SODIUM CITRATE 4% (200MG/5ML) IV SOLN
5.0000 mL | Status: DC | PRN
  Filled 2023-10-24: qty 5

## 2023-10-24 MED ORDER — HEPARIN SODIUM (PORCINE) 1000 UNIT/ML DIALYSIS: 1000.0000 [IU] | INTRAMUSCULAR | Status: DC | PRN

## 2023-10-24 MED ORDER — LIDOCAINE HCL (PF) 1 % IJ SOLN: 5.0000 mL | INTRAMUSCULAR | Status: DC | PRN

## 2023-10-24 MED ORDER — AMLODIPINE BESYLATE 10 MG PO TABS
10.0000 mg | ORAL_TABLET | Freq: Every day | ORAL | Status: DC
  Administered 2023-10-24 – 2023-10-28 (×5): 10 mg via ORAL
  Filled 2023-10-24: qty 1
  Filled 2023-10-24: qty 2
  Filled 2023-10-24 (×3): qty 1

## 2023-10-24 MED ORDER — SEVELAMER CARBONATE 2.4 G PO PACK
2.4000 g | PACK | Freq: Every day | ORAL | Status: DC
  Administered 2023-10-25 – 2023-10-26 (×2): 2.4 g via ORAL
  Filled 2023-10-24 (×4): qty 1

## 2023-10-24 MED ORDER — TRIMETHOBENZAMIDE HCL 100 MG/ML IM SOLN
200.0000 mg | Freq: Once | INTRAMUSCULAR | Status: DC
  Filled 2023-10-24 (×2): qty 2

## 2023-10-24 MED ORDER — HEPARIN SODIUM (PORCINE) 5000 UNIT/ML IJ SOLN
5000.0000 [IU] | Freq: Two times a day (BID) | INTRAMUSCULAR | Status: DC
  Administered 2023-10-24 – 2023-10-28 (×8): 5000 [IU] via SUBCUTANEOUS
  Filled 2023-10-24 (×8): qty 1

## 2023-10-24 MED ORDER — PENTAFLUOROPROP-TETRAFLUOROETH EX AERO: 1.0000 | INHALATION_SPRAY | CUTANEOUS | Status: DC | PRN

## 2023-10-24 MED ORDER — SODIUM BICARBONATE 650 MG PO TABS
650.0000 mg | ORAL_TABLET | Freq: Two times a day (BID) | ORAL | Status: DC
  Administered 2023-10-24: 650 mg via ORAL
  Filled 2023-10-24 (×2): qty 1

## 2023-10-24 MED ORDER — NEPRO/CARBSTEADY PO LIQD: 237.0000 mL | ORAL | Status: DC | PRN

## 2023-10-24 MED ORDER — SODIUM CHLORIDE 0.9% FLUSH
3.0000 mL | Freq: Two times a day (BID) | INTRAVENOUS | Status: DC
  Administered 2023-10-25 – 2023-10-28 (×6): 3 mL via INTRAVENOUS

## 2023-10-24 NOTE — Progress Notes (Addendum)
 PIV requested.  Pt without IVF or IV medications ordered. Given pts hx of CKD with Hemodialysis and lupus,need to salvage veins. Secure chat sent to [place] RN, and Elenore Paddy RN, FNP-Taylor Parcells and Melina Modena and MD's-[place], Odis Hollingshead re Notification of no IV access necessary at this time, will await IV needs to place best access for the pt's ordered therapy.

## 2023-10-24 NOTE — ED Provider Notes (Signed)
  EMERGENCY DEPARTMENT AT [facility] Provider Note   CSN: [id number] Arrival date & time: 10/24/23  4098     History  Chief Complaint  Patient presents with   Shortness of Breath    Linda [name] is a 28 y.o. female past medical history significant for ESRD who presents today for shortness of breath.  Patient states that she missed dialysis on Friday due to transportation issues.  Patient typically goes to dialysis Monday Wednesday Friday, but when she started experiencing shortness of breath she felt like she could not wait until tomorrow to go. Patient also reports acid reflux which is not new.   Shortness of Breath      Home Medications Prior to Admission medications   Medication Sig Start Date End Date Taking? Authorizing Provider  acetaminophen (TYLENOL) 325 MG tablet Take 2 tablets (650 mg total) by mouth every 6 (six) hours as needed for mild pain (pain score 1-3) or moderate pain (pain score 4-6) (or Fever >/= 101). 09/13/23   Hongalgi, Maximino Greenland, MD  amLODipine (NORVASC) 10 MG tablet Take 10 mg by mouth daily. 08/05/21   [provider]  calcitRIOL (ROCALTROL) 0.25 MCG capsule Take 1 tablet by mouth daily. Patient not taking: Reported on 10/10/2023    [provider]  carvedilol (COREG) 25 MG tablet Take 1 tablet (25 mg total) by mouth 2 (two) times daily with a meal. 09/03/23   Rhetta Mura, MD  diclofenac Sodium (VOLTAREN) 1 % GEL Apply 2 g topically 4 (four) times daily. Patient not taking: Reported on 10/10/2023 09/23/23   Long, Arlyss Repress, MD  dicyclomine (BENTYL) 10 MG capsule Take 1 capsule (10 mg total) by mouth 4 (four) times daily -  before meals and at bedtime for 5 days. Abdominal cramps. 10/12/23 10/17/23  Pokhrel, Rebekah Chesterfield, MD  furosemide (LASIX) 80 MG tablet Take 80 mg by mouth 3 (three) times daily. 07/30/23   [provider]  losartan (COZAAR) 50 MG tablet Take 1 tablet (50 mg total) by mouth daily. 10/02/21 08/31/24   Lynn Ito, MD  medroxyPROGESTERone (DEPO-PROVERA) 150 MG/ML injection Inject 1 mL (150 mg total) into the muscle every 3 (three) months. 10/29/22   Brock Bad, MD  methocarbamol (ROBAXIN) 500 MG tablet Take 1 tablet (500 mg total) by mouth every 8 (eight) hours as needed for muscle spasms. 10/12/23   Pokhrel, Rebekah Chesterfield, MD  naloxone [facility]) nasal spray 4 mg/0.1 mL Place 1 spray into the nose once. If needed for emergency overdose.    [provider]  pantoprazole (PROTONIX) 40 MG tablet Take 1 tablet (40 mg total) by mouth daily as needed (for acid reflux). 09/13/23 10/13/23  Hongalgi, Maximino Greenland, MD  RENVELA 2.4 g PACK Take 2.4 g by mouth See admin instructions. Take with a large meal once a day to once every other day. 12/03/22   [provider]  Safety Seal Miscellaneous MISC Use on the alternate nights when you are not applying tretinoin (Tuesday, Thursday, Saturday, and Sunday) 05/24/23   Terri Piedra, DO  sodium bicarbonate 650 MG tablet Take 1 tablet (650 mg total) by mouth 2 (two) times daily. 09/05/21   Leroy Sea, MD  tiZANidine (ZANAFLEX) 4 MG tablet Take 4 mg by mouth 3 (three) times daily. 10/08/23   [provider]  Vitamin D, Ergocalciferol, (DRISDOL) 1.25 MG (50000 UNIT) CAPS capsule ergocalciferol (vitamin D2) 1,250 mcg (50,000 unit) capsule TAKE 1 CAPSULE BY MOUTH WEEKLY 03/11/21  [provider]      Allergies    Lisinopril, Gabapentin, Ativan [lorazepam], Hydroxychloroquine, and Vicodin [hydrocodone-acetaminophen]    Review of Systems   Review of Systems  Respiratory:  Positive for shortness of breath.     Physical Exam Updated Vital Signs BP (!) 151/119   Pulse 87   Temp (!) 97.3 F (36.3 C) (Oral)   Resp (!) 21   Ht 5\' 6"  (1.676 m)   Wt 77.1 kg   SpO2 99%   BMI 27.44 kg/m  Physical Exam Vitals and nursing note reviewed.  Constitutional:      General: She is not in acute distress.    Appearance: She is well-developed.   HENT:     Head: Normocephalic and atraumatic.     Mouth/Throat:     Mouth: Mucous membranes are moist.  Eyes:     Extraocular Movements: Extraocular movements intact.     Conjunctiva/sclera: Conjunctivae normal.  Cardiovascular:     Rate and Rhythm: Normal rate and regular rhythm.     Pulses: Normal pulses.     Heart sounds: Normal heart sounds. No murmur heard.    Comments: Borderline tachycardic Pulmonary:     Effort: Pulmonary effort is normal. Tachypnea present. No respiratory distress.     Breath sounds: Normal breath sounds. No decreased breath sounds.  Abdominal:     Palpations: Abdomen is soft.     Tenderness: There is no abdominal tenderness.  Musculoskeletal:        General: No swelling.     Cervical back: Neck supple.     Right lower leg: No tenderness. No edema.     Left lower leg: No tenderness. No edema.  Skin:    General: Skin is warm and dry.     Capillary Refill: Capillary refill takes less than 2 seconds.  Neurological:     Mental Status: She is alert.  Psychiatric:        Mood and Affect: Mood normal.     ED Results / Procedures / Treatments   Labs (all labs ordered are listed, but only abnormal results are displayed) Labs Reviewed  BASIC METABOLIC PANEL - Abnormal; Notable for the following components:      Result Value   CO2 17 (*)    BUN 65 (*)    Creatinine, Ser 12.34 (*)    GFR, Estimated 4 (*)    Anion gap 20 (*)    All other components within normal limits  CBC - Abnormal; Notable for the following components:   RBC 3.45 (*)    Hemoglobin 10.3 (*)    HCT 33.4 (*)    RDW 16.8 (*)    All other components within normal limits  TROPONIN I (HIGH SENSITIVITY) - Abnormal; Notable for the following components:   Troponin I (High Sensitivity) 51 (*)    All other components within normal limits  HCG, SERUM, QUALITATIVE  TROPONIN I (HIGH SENSITIVITY)    EKG EKG Interpretation Date/Time:  Sunday October 24 2023 07:42:36 EST Ventricular  Rate:  88 PR Interval:  152 QRS Duration:  88 QT Interval:  370 QTC Calculation: 447 R Axis:   16  Text Interpretation: Normal sinus rhythm Biatrial enlargement Pulmonary disease pattern Septal infarct , age undetermined T wave abnormality, consider lateral ischemia Abnormal ECG When compared with ECG of 10-Oct-2023 01:47, PREVIOUS ECG IS PRESENT when cmpared to prior, similar appearance,. No STEMI Confirmed by Theda Belfast ([id number]) on 10/24/2023 8:21:50 AM  Radiology DG Chest 2  View Result Date: 10/24/2023 CLINICAL DATA:  28 year old female with shortness of breath, missed dialysis. EXAM: CHEST - 2 VIEW COMPARISON:  Portable chest 10/12/2023 and earlier. FINDINGS: Seated upright AP and lateral views at 0839 hours. Pronounced increased cardiac silhouette, suspicious for pericardial effusion. Superimposed increased indistinct pulmonary vascular congestion. Only trace pleural fluid in the minor fissures. Lung base atelectasis. No consolidation. No pneumothorax. Visualized tracheal air column is within normal limits. No acute osseous abnormality identified. Negative visible bowel gas. IMPRESSION: 1. Acutely increased cardiac silhouette suspicious for Pericardial Effusion. 2. Evidence of acute interstitial pulmonary edema but only trace pleural fluid. Electronically Signed   By: Odessa Fleming M.D.   On: 10/24/2023 09:12    Procedures Procedures    Medications Ordered in ED Medications  oxyCODONE (Oxy IR/ROXICODONE) immediate release tablet 5 mg (5 mg Oral Given 10/24/23 1211)    ED Course/ Medical Decision Making/ A&P                                 Medical Decision Making Amount and/or Complexity of Data Reviewed Labs: ordered. Radiology: ordered.  Risk Prescription drug management. Decision regarding hospitalization.   This patient presents to the ED with chief complaint(s) of shortness of breath with pertinent past medical history of ESRD which further complicates the presenting  complaint. The complaint involves an extensive differential diagnosis and also carries with it a high risk of complications and morbidity.    The differential diagnosis includes hyperkalemia, asthma exacerbation, COPD exacerbation, pneumonia, fluid overload, acute respiratory failure  Additional history obtained: Records reviewed previous admission documents  ED Course and Reassessment:   Independent labs interpretation:  The following labs were independently interpreted:  EKG: Normal sinus rhythm, Biatrial enlargement, Pulmonary disease pattern, similar to previous EKG CBC: Anemia which is chronic per historical values BMP: Elevated bun at 65, elevated creatinine at 12.34, decreased CO2 at 17, increased anion gap at 20 hCG serum: Negative Troponin: 51,   Independent visualization of imaging: - I independently visualized the following imaging with scope of interpretation limited to determining acute life threatening conditions related to emergency care: Chest x-ray, which revealed acutely increased cardiac silhouette suspicious for pericardial effusion, evidence of acute interstitial pulmonary edema but only trace pleural effusion.  Consultation: - Consulted or discussed management/test interpretation w/ external professional: Cardiology, Dr. Odis Hollingshead who suggested ordering an ECHO and agreed to consult on the patient when admitted for acute pericardial effusion Nephrology, Dr. Arlean Hopping who was agreeable to consult on the patient when admitted  Hospitalist, Dr. Allena Katz who is agreeable to admission for pericardial effusion and pulmonary edema  Consideration for admission or further workup: Admit        Final Clinical Impression(s) / ED Diagnoses Final diagnoses:  Pericardial effusion  Acute pulmonary edema (HCC)  Pleural effusion  SOB (shortness of breath)    Rx / DC Orders ED Discharge Orders     None         Dolphus Jenny, PA-C 10/24/23 1228    Tegeler, Canary Brim, MD 10/24/23 [phone]

## 2023-10-24 NOTE — H&P (Signed)
 History and Physical    Patient: Linda [name]:[id number] DOB: [birth year] DOA: 10/24/2023 DOS: the patient was seen and examined on 10/24/2023 PCP: Pcp, No  Patient coming from: Home Chief complaint: Chief Complaint  Patient presents with   Shortness of Breath   HPI:  Linda [name] is a 28 y.o. female with past medical history  of  end-stage renal disease on hemodialysis on Monday, Wednesday, Friday schedule in the setting of lupus nephritis, chronic diastolic heart failure, send hypertension, anemia of chronic kidney disease, allergies to lisinopril gabapentin Ativan hydroxychloroquine, Vicodin, history of angioedema, smoking e-cigarettes, comes today with complaints of shortness of breath.  Patient did miss her dialysis session on Friday.  Per chart review patient has not followed up with rheumatology since 2023 for her lupus.  Patient also has missed dialysis appointments in the past.  Aunt on the phone is concerned and is unhappy with the patient for her noncompliance.  Discussed with both that it is critical that she complies with dialysis and also rheumatology.  Discussed with patient about tobacco cessation.   >>ED Course: In emergency room patient is in distress on nasal cannula 2 L for comfort vitals stable as below. Vitals:   10/24/23 1200 10/24/23 1204 10/24/23 1505 10/24/23 1555  BP: (!) 151/119  (!) 179/134 (!) 166/125  Pulse: 87  92 93  Temp:  (!) 97.3 F (36.3 C)  97.7 F (36.5 C)  Resp: (!) 21  (!) 24   Height:      Weight:      SpO2: 99%  100%   TempSrc:  Oral  Oral  BMI (Calculated):      EKG shows sinus rhythm 88 with a PR 158 prominent P waves consistent with biatrial enlargement EKG septal infarct with significant Q waves T wave abnormalities with concerns for ischemia in the lateral leads. Basic metabolic panel showing bicarb of 17 creatinine of 12.34 BUN 65 EGFR 4 and anion gap of 20 LFTs added on and pending. CBC shows a normal white count of 9.5  hemoglobin of 10.3 platelet count of 234. Chest x-ray shows interstitial pulmonary edema. EDMD has reached out to cardiology urgently nephrology.  Review of Systems  Respiratory:  Positive for shortness of breath.   All other systems reviewed and are negative.  Past Medical History:  Diagnosis Date   Abnormal ECG    a. In setting of LVH w/ repolarization abnormalities; b. 03/2019 MV: No ischemia/infarct. EF 55-65%.   Angioedema    ESRD (end stage renal disease) (HCC)    on HD (M,W,F)   GERD (gastroesophageal reflux disease)    Hypertension    Lupus    LVH (left ventricular hypertrophy)    a. 11/2017 Echo: EF 50-55%. triv AI. Mild MR. Mod L and mild R atrial enlargement. Nl RV size/fxn. Small pericardial effusion w/o tampondae; b. 03/2018 Echo: EF 55-60%, restrictive filling pattern. Nl RV size/fxn. Sev LAE. Mild MR, mild to mod TR/PR. Mod PAH. Triv effusion.   Migraines    Mixed connective tissue disease (HCC)    Previous Dialysis patient [facility])    a. Off HD since late 2020.   Septic prepatellar bursitis of right knee 08/19/2017   Past Surgical History:  Procedure Laterality Date   GRAFT APPLICATION Left 03/30/2019   Procedure: BRACHIAL CEPHALIC GRAFT CREATION;  Surgeon: Annice Needy, MD;  Location: [facility] ORS;  Service: Vascular;  Laterality: Left;   I & D EXTREMITY Right 08/06/2017   Procedure: IRRIGATION AND  DEBRIDEMENT KNEE;  Surgeon: Roby Lofts, MD;  Location: [facility] OR;  Service: Orthopedics;  Laterality: Right;   IR THROMBECTOMY AV FISTULA W/THROMBOLYSIS/PTA/STENT INC/SHUNT/IMG LT Left 05/18/2022   IR US GUIDE VASC ACCESS LEFT  05/18/2022   MULTIPLE TOOTH EXTRACTIONS      reports that she has been smoking e-cigarettes. She started smoking about 8 years ago. She has a 2.2 pack-year smoking history. She has never used smokeless tobacco. She reports that she does not drink alcohol and does not use drugs.  Allergies  Allergen Reactions   Lisinopril Anaphylaxis    Angioedema    Gabapentin Other (See Comments)    Reaction: Tremor (intolerance); tremor   Ativan [Lorazepam] Other (See Comments)    Somnolent with 1mg  ativan at [facility] Nov 2019   Hydroxychloroquine Other (See Comments)    Pt reports making her skin peel really bad   Vicodin [Hydrocodone-Acetaminophen] Itching and Rash    Family History  Problem Relation Age of Onset   Lupus Mother    Hypertension Mother    Kidney disease Mother    Heart Problems Mother    Coronary artery disease Mother 20       stent   Diabetes Maternal Grandmother     Prior to Admission medications   Medication Sig Start Date End Date Taking? Authorizing Provider  acetaminophen (TYLENOL) 325 MG tablet Take 2 tablets (650 mg total) by mouth every 6 (six) hours as needed for mild pain (pain score 1-3) or moderate pain (pain score 4-6) (or Fever >/= 101). 09/13/23   Hongalgi, Maximino Greenland, MD  amLODipine (NORVASC) 10 MG tablet Take 10 mg by mouth daily. 08/05/21   [provider]  calcitRIOL (ROCALTROL) 0.25 MCG capsule Take 1 tablet by mouth daily. Patient not taking: Reported on 10/10/2023    [provider]  carvedilol (COREG) 25 MG tablet Take 1 tablet (25 mg total) by mouth 2 (two) times daily with a meal. 09/03/23   Rhetta Mura, MD  diclofenac Sodium (VOLTAREN) 1 % GEL Apply 2 g topically 4 (four) times daily. Patient not taking: Reported on 10/10/2023 09/23/23   Long, Arlyss Repress, MD  dicyclomine (BENTYL) 10 MG capsule Take 1 capsule (10 mg total) by mouth 4 (four) times daily -  before meals and at bedtime for 5 days. Abdominal cramps. 10/12/23 10/17/23  Pokhrel, Rebekah Chesterfield, MD  furosemide (LASIX) 80 MG tablet Take 80 mg by mouth 3 (three) times daily. 07/30/23   [provider]  losartan (COZAAR) 50 MG tablet Take 1 tablet (50 mg total) by mouth daily. 10/02/21 08/31/24  Lynn Ito, MD  medroxyPROGESTERone (DEPO-PROVERA) 150 MG/ML injection Inject 1 mL (150 mg total) into the muscle every 3 (three) months.  10/29/22   Brock Bad, MD  methocarbamol (ROBAXIN) 500 MG tablet Take 1 tablet (500 mg total) by mouth every 8 (eight) hours as needed for muscle spasms. 10/12/23   Pokhrel, Rebekah Chesterfield, MD  naloxone [facility]) nasal spray 4 mg/0.1 mL Place 1 spray into the nose once. If needed for emergency overdose.    [provider]  pantoprazole (PROTONIX) 40 MG tablet Take 1 tablet (40 mg total) by mouth daily as needed (for acid reflux). 09/13/23 10/13/23  Hongalgi, Maximino Greenland, MD  RENVELA 2.4 g PACK Take 2.4 g by mouth See admin instructions. Take with a large meal once a day to once every other day. 12/03/22   [provider]  Safety Seal Miscellaneous MISC Use on the alternate nights when  you are not applying tretinoin (Tuesday, Thursday, Saturday, and Sunday) 05/24/23   Terri Piedra, DO  sodium bicarbonate 650 MG tablet Take 1 tablet (650 mg total) by mouth 2 (two) times daily. 09/05/21   Leroy Sea, MD  tiZANidine (ZANAFLEX) 4 MG tablet Take 4 mg by mouth 3 (three) times daily. 10/08/23   [provider]  Vitamin D, Ergocalciferol, (DRISDOL) 1.25 MG (50000 UNIT) CAPS capsule ergocalciferol (vitamin D2) 1,250 mcg (50,000 unit) capsule TAKE 1 CAPSULE BY MOUTH WEEKLY 03/11/21   [provider]     Vitals:   10/24/23 1200 10/24/23 1204 10/24/23 1505 10/24/23 1555  BP: (!) 151/119  (!) 179/134 (!) 166/125  Pulse: 87  92 93  Resp: (!) 21  (!) 24   Temp:  (!) 97.3 F (36.3 C)  97.7 F (36.5 C)  TempSrc:  Oral  Oral  SpO2: 99%  100%   Weight:      Height:       Physical Exam Vitals and nursing note reviewed.  Constitutional:      General: She is not in acute distress. HENT:     Head: Normocephalic and atraumatic.     Right Ear: Hearing normal.     Left Ear: Hearing normal.     Nose: Nose normal. No nasal deformity.     Mouth/Throat:     Lips: Pink.     Tongue: No lesions.     Pharynx: Oropharynx is clear.  Eyes:     General: Lids are normal.     Extraocular  Movements: Extraocular movements intact.  Cardiovascular:     Rate and Rhythm: Normal rate and regular rhythm.     Heart sounds: Normal heart sounds.  Pulmonary:     Effort: Pulmonary effort is normal.     Breath sounds: Examination of the right-upper field reveals rales. Examination of the left-upper field reveals rales. Examination of the right-middle field reveals rales. Examination of the left-middle field reveals rales. Examination of the right-lower field reveals rales. Examination of the left-lower field reveals rales. Rales present.  Abdominal:     General: Bowel sounds are normal. There is no distension.     Palpations: Abdomen is soft. There is no mass.     Tenderness: There is no abdominal tenderness.  Musculoskeletal:     Right lower leg: No edema.     Left lower leg: No edema.  Skin:    General: Skin is warm.  Neurological:     General: No focal deficit present.     Mental Status: She is alert and oriented to person, place, and time.     Cranial Nerves: Cranial nerves 2-12 are intact.  Psychiatric:        Attention and Perception: Attention normal.        Mood and Affect: Mood normal.        Speech: Speech normal.        Behavior: Behavior normal. Behavior is cooperative.      Labs on Admission: I have personally reviewed following labs and imaging studies Results for orders placed or performed during the hospital encounter of 10/24/23 (from the past 24 hours)  Basic metabolic panel     Status: Abnormal   Collection Time: 10/24/23  8:13 AM  Result Value Ref Range   Sodium 141 135 - 145 mmol/L   Potassium 4.6 3.5 - 5.1 mmol/L   Chloride 104 98 - 111 mmol/L   CO2 17 (L) 22 - 32 mmol/L  Glucose, Bld 82 70 - 99 mg/dL   BUN 65 (H) 6 - 20 mg/dL   Creatinine, Ser 16.10 (H) 0.44 - 1.00 mg/dL   Calcium 9.3 8.9 - 96.0 mg/dL   GFR, Estimated 4 (L) >60 mL/min   Anion gap 20 (H) 5 - 15  CBC     Status: Abnormal   Collection Time: 10/24/23  8:13 AM  Result Value Ref Range    WBC 9.5 4.0 - 10.5 K/uL   RBC 3.45 (L) 3.87 - 5.11 MIL/uL   Hemoglobin 10.3 (L) 12.0 - 15.0 g/dL   HCT 45.4 (L) 09.8 - 11.9 %   MCV 96.8 80.0 - 100.0 fL   MCH 29.9 26.0 - 34.0 pg   MCHC 30.8 30.0 - 36.0 g/dL   RDW 14.7 (H) 82.9 - 56.2 %   Platelets 234 150 - 400 K/uL   nRBC 0.0 0.0 - 0.2 %  hCG, serum, qualitative     Status: None   Collection Time: 10/24/23  8:13 AM  Result Value Ref Range   Preg, Serum NEGATIVE NEGATIVE  Troponin I (High Sensitivity)     Status: Abnormal   Collection Time: 10/24/23 10:02 AM  Result Value Ref Range   Troponin I (High Sensitivity) 51 (H) <18 ng/L  Troponin I (High Sensitivity)     Status: Abnormal   Collection Time: 10/24/23  1:46 PM  Result Value Ref Range   Troponin I (High Sensitivity) 45 (H) <18 ng/L  CBC     Status: Abnormal   Collection Time: 10/24/23  1:46 PM  Result Value Ref Range   WBC 10.4 4.0 - 10.5 K/uL   RBC 3.06 (L) 3.87 - 5.11 MIL/uL   Hemoglobin 9.2 (L) 12.0 - 15.0 g/dL   HCT 13.0 (L) 86.5 - 78.4 %   MCV 97.1 80.0 - 100.0 fL   MCH 30.1 26.0 - 34.0 pg   MCHC 31.0 30.0 - 36.0 g/dL   RDW 69.6 (H) 29.5 - 28.4 %   Platelets 227 150 - 400 K/uL   nRBC 0.0 0.0 - 0.2 %  Creatinine, serum     Status: Abnormal   Collection Time: 10/24/23  1:46 PM  Result Value Ref Range   Creatinine, Ser 12.90 (H) 0.44 - 1.00 mg/dL   GFR, Estimated 4 (L) >60 mL/min  TSH     Status: None   Collection Time: 10/24/23  1:46 PM  Result Value Ref Range   TSH 1.081 0.350 - 4.500 uIU/mL  T4, free     Status: None   Collection Time: 10/24/23  1:46 PM  Result Value Ref Range   Free T4 0.79 0.61 - 1.12 ng/dL  Hepatic function panel     Status: Abnormal   Collection Time: 10/24/23  1:46 PM  Result Value Ref Range   Total Protein 7.3 6.5 - 8.1 g/dL   Albumin 2.9 (L) 3.5 - 5.0 g/dL   AST 14 (L) 15 - 41 U/L   ALT 8 0 - 44 U/L   Alkaline Phosphatase 63 38 - 126 U/L   Total Bilirubin 0.9 0.0 - 1.2 mg/dL   Bilirubin, Direct 0.1 0.0 - 0.2 mg/dL    Indirect Bilirubin 0.8 0.3 - 0.9 mg/dL   Recent Results (from the past 720 hours)  Blood Culture (routine x 2)     Status: None   Collection Time: 10/10/23  2:01 AM   Specimen: BLOOD  Result Value Ref Range Status   Specimen Description BLOOD BLOOD RIGHT ARM  Final   Special Requests   Final    BOTTLES DRAWN AEROBIC AND ANAEROBIC Blood Culture results may not be optimal due to an inadequate volume of blood received in culture bottles   Culture   Final    NO GROWTH 5 DAYS Performed at [facility], [address] [place], [place] [address]    Report Status 10/15/2023 FINAL  Final  Blood Culture (routine x 2)     Status: None   Collection Time: 10/10/23  2:06 AM   Specimen: BLOOD  Result Value Ref Range Status   Specimen Description BLOOD BLOOD RIGHT ARM  Final   Special Requests   Final    BOTTLES DRAWN AEROBIC AND ANAEROBIC Blood Culture results may not be optimal due to an inadequate volume of blood received in culture bottles   Culture   Final    NO GROWTH 5 DAYS Performed at [facility], [address] [place], [place] [address]    Report Status 10/15/2023 FINAL  Final  Resp panel by RT-PCR (RSV, Flu A&B, Covid) Anterior Nasal Swab     Status: None   Collection Time: 10/10/23  2:06 AM   Specimen: Anterior Nasal Swab  Result Value Ref Range Status   SARS Coronavirus 2 by RT PCR NEGATIVE NEGATIVE Final   Influenza A by PCR NEGATIVE NEGATIVE Final   Influenza B by PCR NEGATIVE NEGATIVE Final    Comment: (NOTE) The Xpert Xpress SARS-CoV-2/FLU/RSV plus assay is intended as an aid in the diagnosis of influenza from Nasopharyngeal swab specimens and should not be used as a sole basis for treatment. Nasal washings and aspirates are unacceptable for Xpert Xpress SARS-CoV-2/FLU/RSV testing.  Fact Sheet for Patients: BloggerCourse.com  Fact Sheet for Healthcare Providers: SeriousBroker.it  This test is not yet  approved or cleared by the [place] FDA and has been authorized for detection and/or diagnosis of SARS-CoV-2 by FDA under an Emergency Use Authorization (EUA). This EUA will remain in effect (meaning this test can be used) for the duration of the COVID-19 declaration under Section 564(b)(1) of the Act, 21 U.S.C. section 360bbb-3(b)(1), unless the authorization is terminated or revoked.     Resp Syncytial Virus by PCR NEGATIVE NEGATIVE Final    Comment: (NOTE) Fact Sheet for Patients: BloggerCourse.com  Fact Sheet for Healthcare Providers: SeriousBroker.it  This test is not yet approved or cleared by the [place] FDA and has been authorized for detection and/or diagnosis of SARS-CoV-2 by FDA under an Emergency Use Authorization (EUA). This EUA will remain in effect (meaning this test can be used) for the duration of the COVID-19 declaration under Section 564(b)(1) of the Act, 21 U.S.C. section 360bbb-3(b)(1), unless the authorization is terminated or revoked.  Performed at [facility], [address], [place], [place] [address]   MRSA Next Gen by PCR, Nasal     Status: None   Collection Time: 10/10/23  4:24 PM   Specimen: Nasal Mucosa; Nasal Swab  Result Value Ref Range Status   MRSA by PCR Next Gen NOT DETECTED NOT DETECTED Final    Comment: (NOTE) The GeneXpert MRSA Assay (FDA approved for NASAL specimens only), is one component of a comprehensive MRSA colonization surveillance program. It is not intended to diagnose MRSA infection nor to guide or monitor treatment for MRSA infections. Test performance is not FDA approved in patients less than 32 years old. Performed at [facility], [address], [place], [place] [address]  CBC:    Latest Ref Rng & Units 10/24/2023    1:46 PM 10/24/2023    8:13 AM 10/11/2023    4:19 AM  CBC  WBC 4.0 - 10.5 K/uL 10.4  9.5  10.6   Hemoglobin 12.0 - 15.0 g/dL  9.2  16.1  09.6   Hematocrit 36.0 - 46.0 % 29.7  33.4  34.8   Platelets 150 - 400 K/uL 227  234  226    Basic Metabolic Panel: Recent Labs  Lab 10/24/23 0813 10/24/23 1346  NA 141  --   K 4.6  --   CL 104  --   CO2 17*  --   GLUCOSE 82  --   BUN 65*  --   CREATININE 12.34* 12.90*  CALCIUM 9.3  --    Creatinine: Lab Results  Component Value Date   CREATININE 12.90 (H) 10/24/2023   CREATININE 12.34 (H) 10/24/2023   CREATININE 7.96 (H) 10/11/2023   Liver Function Tests:    Latest Ref Rng & Units 10/24/2023    1:46 PM 10/10/2023    2:01 AM 09/11/2023   12:23 PM  Hepatic Function  Total Protein 6.5 - 8.1 g/dL 7.3  7.6  7.7   Albumin 3.5 - 5.0 g/dL 2.9  3.5  3.6   AST 15 - 41 U/L 14  15  21    ALT 0 - 44 U/L 8  9  16    Alk Phosphatase 38 - 126 U/L 63  85  92   Total Bilirubin 0.0 - 1.2 mg/dL 0.9  1.2  1.0   Bilirubin, Direct 0.0 - 0.2 mg/dL 0.1      Coagulation Profile: No results for input(s): "INR", "PROTIME" in the last 168 hours. Cardiac Enzymes: No results for input(s): "CKTOTAL", "CKMB", "CKMBINDEX", "TROPONINI" in the last 168 hours. BNP (last 3 results) No results for input(s): "PROBNP" in the last 8760 hours. HbA1C: No results for input(s): "HGBA1C" in the last 72 hours. Lipid Profile: No results for input(s): "CHOL", "HDL", "LDLCALC", "TRIG", "CHOLHDL", "LDLDIRECT" in the last 72 hours.  Radiological Exams on Admission: DG Chest 2 View Result Date: 10/24/2023 CLINICAL DATA:  28 year old female with shortness of breath, missed dialysis. EXAM: CHEST - 2 VIEW COMPARISON:  Portable chest 10/12/2023 and earlier. FINDINGS: Seated upright AP and lateral views at 0839 hours. Pronounced increased cardiac silhouette, suspicious for pericardial effusion. Superimposed increased indistinct pulmonary vascular congestion. Only trace pleural fluid in the minor fissures. Lung base atelectasis. No consolidation. No pneumothorax. Visualized tracheal air column is within normal  limits. No acute osseous abnormality identified. Negative visible bowel gas. IMPRESSION: 1. Acutely increased cardiac silhouette suspicious for Pericardial Effusion. 2. Evidence of acute interstitial pulmonary edema but only trace pleural fluid. Electronically Signed   By: Odessa Fleming M.D.   On: 10/24/2023 09:12    Data Reviewed: Relevant notes from primary care and specialist visits, past discharge summaries as available in EHR, including Care Everywhere. Prior diagnostic testing as pertinent to current admission diagnoses, Updated medications and problem lists for reconciliation ED course, including vitals, labs, imaging, treatment and response to treatment,Triage notes, nursing and pharmacy notes and ED provider's notes Notable results as noted in HPI.Discussed case with EDMD/ ED APP/ or Specialty MD on call and as needed.  Assessment and Plan: >> Shortness of breath/interstitial pulmonary edema and pericardial effusion/ c/h diastolic CHF: Due to concern of pericardial effusion and risk of tamponade stat cardiology consulted by EDMD and pt has 2 d echo pending.  Supplemental oxygen a needed with low threshold for NIPPV as needed.  SpO2: 100 % O2 Flow Rate (L/min): 2 L/min Strict I/O. Echo in jan 2025 showed: 1. Basal lateral hypokinesis. . Left ventricular ejection fraction, by  estimation, is 55%. The left ventricle has normal function. There is  severe concentric left ventricular hypertrophy. Indeterminate diastolic  filling due to E-A fusion.   2. Right ventricular systolic function is normal. The right ventricular  size is normal.   3. Left atrial size was severely dilated.   4. Minimally restricted motion of MV.. Mild mitral valve regurgitation.   5. The aortic valve is tricuspid. Aortic valve regurgitation is not  visualized.   6. The inferior vena cava is normal in size with greater than 50%  respiratory variability, suggesting right atrial pressure of 3 mmHg.  Pt is at high risk  for VTE and we will get v/q scan due to her severe allergy to iodine.    >>ESRD on HD 2/2 lupus nephritis: Pt needs HD will defer to nephrology . Cont sodium bicarbonate.    >>Anemia: 2/2 anemia of chronic kidney disease.  Type/screen. Follow.  Transfuse during HD as needed.   >>Tobacco abuse: Nicotine patch if pt smokes cigarettes.    DVT prophylaxis:  Heparin.  Consults:  Cardiology Nephrology.  Advance Care Planning:    Code Status: Full Code   Family Communication:  None  Disposition Plan:  Home  Severity of Illness: The appropriate patient status for this patient is INPATIENT. Inpatient status is judged to be reasonable and necessary in order to provide the required intensity of service to ensure the patient's safety. The patient's presenting symptoms, physical exam findings, and initial radiographic and laboratory data in the context of their chronic comorbidities is felt to place them at high risk for further clinical deterioration. Furthermore, it is not anticipated that the patient will be medically stable for discharge from the hospital within 2 midnights of admission.   * I certify that at the point of admission it is my clinical judgment that the patient will require inpatient hospital care spanning beyond 2 midnights from the point of admission due to high intensity of service, high risk for further deterioration and high frequency of surveillance required.*  Author: Gertha Calkin, MD 10/24/2023 4:48 PM  For on call review [email or web address].   Unresulted Labs (From admission, onward)     Start     Ordered   10/25/23 0500  Comprehensive metabolic panel  Tomorrow morning,   R        10/24/23 1249   10/25/23 0500  Magnesium  Tomorrow morning,   R        10/24/23 1249   10/25/23 0500  Phosphorus  Tomorrow morning,   R        10/24/23 1249   10/25/23 0500  CBC  Tomorrow morning,   R        10/24/23 1249           Medications  heparin injection 5,000  Units (has no administration in time range)  morphine (PF) 2 MG/ML injection 2 mg (has no administration in time range)  hydrALAZINE (APRESOLINE) injection 10 mg (has no administration in time range)  sodium chloride flush (NS) 0.9 % injection 3 mL (has no administration in time range)  sodium chloride flush (NS) 0.9 % injection 3 mL (has no administration in time range)  sodium chloride flush (NS) 0.9 % injection 3 mL (has no administration in time  range)  0.9 %  sodium chloride infusion (has no administration in time range)  carvedilol (COREG) tablet 25 mg (has no administration in time range)  sodium bicarbonate tablet 650 mg (has no administration in time range)  sevelamer carbonate (RENVELA) powder PACK 2.4 g (has no administration in time range)  amLODipine (NORVASC) tablet 10 mg (has no administration in time range)  oxyCODONE (Oxy IR/ROXICODONE) immediate release tablet 5 mg (5 mg Oral Given 10/24/23 1211)    Orders Placed This Encounter  Procedures   DG Chest 2 View   NM Pulmonary Perf and Vent   Basic metabolic panel   CBC   hCG, serum, qualitative   CBC   Creatinine, serum   Comprehensive metabolic panel   Magnesium   Phosphorus   TSH   T4, free   CBC   Hepatic function panel   Diet renal with fluid restriction Fluid restriction: 1200 mL Fluid; Room service appropriate? Yes; Fluid consistency: Thin   Diet NPO time specified   Document Height and Actual Weight   ED Cardiac monitoring   Vital signs   Notify physician (specify)   Mobility Protocol: No Restrictions RN to initiate protocols based on patient's level of care   Refer to Sidebar Report Refer to ICU, Med-Surg, Progressive, and Step-Down Mobility Protocol Sidebars   Initiate Adult Central Line Maintenance and Catheter Protocol for patients with central line (CVC, PICC, Port, Hemodialysis, Trialysis)   Daily weights   Intake and Output   Do not place and if present remove PureWick   Initiate Oral Care Protocol    Initiate Carrier Fluid Protocol   RN may order General Admission PRN Orders utilizing "General Admission PRN medications" (through manage orders) for the following patient needs: allergy symptoms (Claritin), cold sores (Carmex), cough (Robitussin DM), eye irritation (Liquifilm Tears), hemorrhoids (Tucks), indigestion (Maalox), minor skin irritation (Hydrocortisone Cream), muscle pain Romeo Apple Gay), nose irritation (saline nasal spray) and sore throat (Chloraseptic spray).   Cardiac Monitoring - Continuous Indefinite   Maintain IV access   Informed Consent Details: Physician/Practitioner Attestation; Transcribe to consent form and obtain patient signature   Pre-Hemodialysis Protocol - Day of Dialysis   Post-Dialysis Protocol - Day of Dialysis   Full code   Inpatient consult to Cardiology   Consult to nephrology   Consult for Unassigned Medical Admission   Consult to hospitalist   Pulse oximetry check with vital signs   Oxygen therapy Mode or (Route): Nasal cannula; Liters Per Minute: 2; Keep O2 saturation between: greater than 92 %   ED EKG   EKG 12-Lead   ECHOCARDIOGRAM COMPLETE   Insert peripheral IV   Hemodialysis inpatient   Admit to Inpatient (patient's expected length of stay will be greater than 2 midnights or inpatient only procedure)   Aspiration precautions   Fall precautions   VAS [place] LOWER EXTREMITY VENOUS (DVT)

## 2023-10-24 NOTE — Consult Note (Addendum)
 Cardiology Consultation   Patient ID: Linda [name] MRN: [id number]; DOB: 11-12-1995  Admit date: 10/24/2023 Date of Consult: 10/24/2023  PCP:  Aviva Kluver   [facility] Providers Cardiologist:  Yvonne Kendall, MD   {   Chief complaint: Shortness of breath. Reason of consult: Pericardial effusion Referring physician: Dr. Thayer Ohm Tegeler  Patient Profile:   Linda [name] is a 29 y.o. female with a hx of ESRD on HD (M/W/F), lupus nephritis, chronic diastolic heart failure, hypertension, anemia of chronic disease who is being seen 10/24/2023 for the evaluation of shortness of breath with pericardial effusion at the request of Dr. Rush Landmark.  History of Present Illness:   Linda [name] has a past medical history of ESRD on HD (M/W/F), lupus nephritis, chronic diastolic heart failure, hypertension, anemia of chronic disease. She presented to [facility] ED on 10/24/2023 via EMS complaining of shortness of breath. Patient reportedly missed her Friday dialysis session due to transportation issues.   While in the ED, patient had a CXR which showed an acutely increased cardiac silhouette concerning for a pericardial effusion with acute interstitial pulmonary edema with only trace pleural fluid.  Cardiology was consulted for concerns for pericardial effusion.  An updated echocardiogram was ordered on this patient, pending results. Prior echo from 09/12/2023 showed: basal lateral hypokinesis, EF 55%, severe concentric LVH, severe left atrial enlargement, mild MR, with trivial pericardial effusion. Echo from 09/2023 showed EF 45-50%, grade II diastolic dysfunction also with a trivial pericardial effusion.  Relevant workup from the ED includes: creatinine 12.34 (missed Friday HD), BUN 65, GFR 4, hemoglobin 10.3 (stable for baseline anemia), negative hCG, pending troponin  She was recently hospitalized from 2/9-2/07/2024 for acute hypoxic respiratory failure. She presented with SpO2 of 80% on  room air. She required BiPAP. This was also on a Sunday after a missed session of Friday dialysis.  Past Medical History:  Diagnosis Date   Abnormal ECG    a. In setting of LVH w/ repolarization abnormalities; b. 03/2019 MV: No ischemia/infarct. EF 55-65%.   Angioedema    ESRD (end stage renal disease) (HCC)    on HD (M,W,F)   GERD (gastroesophageal reflux disease)    Hypertension    Lupus    LVH (left ventricular hypertrophy)    a. 11/2017 Echo: EF 50-55%. triv AI. Mild MR. Mod L and mild R atrial enlargement. Nl RV size/fxn. Small pericardial effusion w/o tampondae; b. 03/2018 Echo: EF 55-60%, restrictive filling pattern. Nl RV size/fxn. Sev LAE. Mild MR, mild to mod TR/PR. Mod PAH. Triv effusion.   Migraines    Mixed connective tissue disease (HCC)    Previous Dialysis patient [facility])    a. Off HD since late 2020.   Septic prepatellar bursitis of right knee 08/19/2017   Past Surgical History:  Procedure Laterality Date   GRAFT APPLICATION Left 03/30/2019   Procedure: BRACHIAL CEPHALIC GRAFT CREATION;  Surgeon: Annice Needy, MD;  Location: [facility] ORS;  Service: Vascular;  Laterality: Left;   I & D EXTREMITY Right 08/06/2017   Procedure: IRRIGATION AND DEBRIDEMENT KNEE;  Surgeon: Roby Lofts, MD;  Location: [facility] OR;  Service: Orthopedics;  Laterality: Right;   IR THROMBECTOMY AV FISTULA W/THROMBOLYSIS/PTA/STENT INC/SHUNT/IMG LT Left 05/18/2022   IR US GUIDE VASC ACCESS LEFT  05/18/2022   MULTIPLE TOOTH EXTRACTIONS      Home Medications:  Prior to Admission medications   Medication Sig Start Date End Date Taking? Authorizing Provider  acetaminophen (TYLENOL) 325 MG tablet  Take 2 tablets (650 mg total) by mouth every 6 (six) hours as needed for mild pain (pain score 1-3) or moderate pain (pain score 4-6) (or Fever >/= 101). 09/13/23   Hongalgi, Maximino Greenland, MD  amLODipine (NORVASC) 10 MG tablet Take 10 mg by mouth daily. 08/05/21   [provider]  calcitRIOL (ROCALTROL) 0.25 MCG capsule  Take 1 tablet by mouth daily. Patient not taking: Reported on 10/10/2023    [provider]  carvedilol (COREG) 25 MG tablet Take 1 tablet (25 mg total) by mouth 2 (two) times daily with a meal. 09/03/23   Rhetta Mura, MD  diclofenac Sodium (VOLTAREN) 1 % GEL Apply 2 g topically 4 (four) times daily. Patient not taking: Reported on 10/10/2023 09/23/23   Long, Arlyss Repress, MD  dicyclomine (BENTYL) 10 MG capsule Take 1 capsule (10 mg total) by mouth 4 (four) times daily -  before meals and at bedtime for 5 days. Abdominal cramps. 10/12/23 10/17/23  Pokhrel, Rebekah Chesterfield, MD  furosemide (LASIX) 80 MG tablet Take 80 mg by mouth 3 (three) times daily. 07/30/23   [provider]  losartan (COZAAR) 50 MG tablet Take 1 tablet (50 mg total) by mouth daily. 10/02/21 08/31/24  Lynn Ito, MD  medroxyPROGESTERone (DEPO-PROVERA) 150 MG/ML injection Inject 1 mL (150 mg total) into the muscle every 3 (three) months. 10/29/22   Brock Bad, MD  methocarbamol (ROBAXIN) 500 MG tablet Take 1 tablet (500 mg total) by mouth every 8 (eight) hours as needed for muscle spasms. 10/12/23   Pokhrel, Rebekah Chesterfield, MD  naloxone [facility]) nasal spray 4 mg/0.1 mL Place 1 spray into the nose once. If needed for emergency overdose.    [provider]  pantoprazole (PROTONIX) 40 MG tablet Take 1 tablet (40 mg total) by mouth daily as needed (for acid reflux). 09/13/23 10/13/23  Hongalgi, Maximino Greenland, MD  RENVELA 2.4 g PACK Take 2.4 g by mouth See admin instructions. Take with a large meal once a day to once every other day. 12/03/22   [provider]  Safety Seal Miscellaneous MISC Use on the alternate nights when you are not applying tretinoin (Tuesday, Thursday, Saturday, and Sunday) 05/24/23   Terri Piedra, DO  sodium bicarbonate 650 MG tablet Take 1 tablet (650 mg total) by mouth 2 (two) times daily. 09/05/21   Leroy Sea, MD  tiZANidine (ZANAFLEX) 4 MG tablet Take 4 mg by mouth 3 (three) times daily. 10/08/23    [provider]  Vitamin D, Ergocalciferol, (DRISDOL) 1.25 MG (50000 UNIT) CAPS capsule ergocalciferol (vitamin D2) 1,250 mcg (50,000 unit) capsule TAKE 1 CAPSULE BY MOUTH WEEKLY 03/11/21   [provider]    Inpatient Medications: Scheduled Meds:  Continuous Infusions:  PRN Meds:   Allergies:    Allergies  Allergen Reactions   Lisinopril Anaphylaxis    Angioedema   Gabapentin Other (See Comments)    Reaction: Tremor (intolerance); tremor   Ativan [Lorazepam] Other (See Comments)    Somnolent with 1mg  ativan at [facility] Nov 2019   Hydroxychloroquine Other (See Comments)    Pt reports making her skin peel really bad   Vicodin [Hydrocodone-Acetaminophen] Itching and Rash   Social History:   Social History   Socioeconomic History   Marital status: Single    Spouse name: Not on file   Number of children: Not on file   Years of education: Not on file   Highest education level: Not on file  Occupational History  Not on file  Tobacco Use   Smoking status: Every Day    Current packs/day: 0.25    Average packs/day: 0.3 packs/day for 8.7 years (2.2 ttl pk-yrs)    Types: E-cigarettes, Cigarettes    Start date: 02/11/2015   Smokeless tobacco: Never  Vaping Use   Vaping status: Every Day  Substance and Sexual Activity   Alcohol use: No    Alcohol/week: 0.0 standard drinks of alcohol   Drug use: No   Sexual activity: Yes    Partners: Male    Birth control/protection: None  Other Topics Concern   Not on file  Social History Narrative   Lives with boyfriend   Social Drivers of Health   Financial Resource Strain: Low Risk  (09/10/2023)   Received from [facility]   Overall Financial Resource Strain (CARDIA)    Difficulty of Paying Living Expenses: Not hard at all  Food Insecurity: No Food Insecurity (10/10/2023)   Hunger Vital Sign    Worried About Running Out of Food in the Last Year: Never true    Ran Out of Food in the Last Year: Never true   Transportation Needs: No Transportation Needs (10/10/2023)   PRAPARE - Administrator, Civil Service (Medical): No    Lack of Transportation (Non-Medical): No  Physical Activity: Not on file  Stress: Not on file  Social Connections: Unknown (01/12/2022)   Received from [facility]   Social Network    Social Network: Not on file  Intimate Partner Violence: Not At Risk (10/10/2023)   Humiliation, Afraid, Rape, and Kick questionnaire    Fear of Current or Ex-Partner: No    Emotionally Abused: No    Physically Abused: No    Sexually Abused: No    Family History:   Family History  Problem Relation Age of Onset   Lupus Mother    Hypertension Mother    Kidney disease Mother    Heart Problems Mother    Coronary artery disease Mother 25       stent   Diabetes Maternal Grandmother     ROS:  Please see the history of present illness.  All other ROS reviewed and negative.     Physical Exam/Data:   Vitals:   10/24/23 0739 10/24/23 0926 10/24/23 1001 10/24/23 1015  BP:  (!) 160/122  (!) 150/110  Pulse:  97  98  Resp:  (!) 24  (!) 24  Temp:   97.6 F (36.4 C)   TempSrc:   Oral   SpO2: 94% 100%  100%  Weight: 77.1 kg     Height: 5\' 6"  (1.676 m)      No intake or output data in the 24 hours ending 10/24/23 1141    10/24/2023    7:39 AM 10/12/2023    9:39 AM 10/12/2023    5:00 AM  Last 3 Weights  Weight (lbs) 170 lb 151 lb 12.8 oz 149 lb 0.5 oz  Weight (kg) 77.111 kg 68.856 kg 67.6 kg     Body mass index is 27.44 kg/m.  General:  In no acute distress on 2 L oxygen via [place] HEENT: normal Neck: no JVD Vascular: Distal pulses 2+ bilaterally Cardiac:  normal S1, S2; RRR; no murmur  Lungs:  clear to auscultation bilaterally, no wheezing, rhonchi or rales  Abd: soft, nontender Ext: no edema Musculoskeletal:  No deformities Skin: warm and dry  Neuro:  no focal abnormalities noted Psych:  Normal affect   EKG:  The EKG was personally reviewed and  demonstrates:  sinus rhythm, HR 88 bpm, biatrial enlargement   Telemetry:  Telemetry was personally reviewed and demonstrates:  sinus rhythm, HR 90s  Relevant CV Studies: Echocardiogram 10/24/2023 -- ordered pending results   Laboratory Data:  High Sensitivity Troponin:   Recent Labs  Lab 10/24/23 1002  TROPONINIHS 51*     Chemistry Recent Labs  Lab 10/24/23 0813  NA 141  K 4.6  CL 104  CO2 17*  GLUCOSE 82  BUN 65*  CREATININE 12.34*  CALCIUM 9.3  GFRNONAA 4*  ANIONGAP 20*    No results for input(s): "PROT", "ALBUMIN", "AST", "ALT", "ALKPHOS", "BILITOT" in the last 168 hours. Lipids No results for input(s): "CHOL", "TRIG", "HDL", "LABVLDL", "LDLCALC", "CHOLHDL" in the last 168 hours.  Hematology Recent Labs  Lab 10/24/23 0813  WBC 9.5  RBC 3.45*  HGB 10.3*  HCT 33.4*  MCV 96.8  MCH 29.9  MCHC 30.8  RDW 16.8*  PLT 234   Thyroid No results for input(s): "TSH", "FREET4" in the last 168 hours.  BNPNo results for input(s): "BNP", "PROBNP" in the last 168 hours.  DDimer No results for input(s): "DDIMER" in the last 168 hours.  Radiology/Studies:  DG Chest 2 View Result Date: 10/24/2023 CLINICAL DATA:  28 year old female with shortness of breath, missed dialysis. EXAM: CHEST - 2 VIEW COMPARISON:  Portable chest 10/12/2023 and earlier. FINDINGS: Seated upright AP and lateral views at 0839 hours. Pronounced increased cardiac silhouette, suspicious for pericardial effusion. Superimposed increased indistinct pulmonary vascular congestion. Only trace pleural fluid in the minor fissures. Lung base atelectasis. No consolidation. No pneumothorax. Visualized tracheal air column is within normal limits. No acute osseous abnormality identified. Negative visible bowel gas. IMPRESSION: 1. Acutely increased cardiac silhouette suspicious for Pericardial Effusion. 2. Evidence of acute interstitial pulmonary edema but only trace pleural fluid. Electronically Signed   By: Odessa Fleming M.D.   On:  10/24/2023 09:12   Assessment and Plan:   Pericardial effusion  Shortness of breath Chronic diastolic heart failure  Ordered updated echocardiogram -- pending results  Trivial pericardial effusions have been seen on past two echocardiograms (08/2021, 09/2023) Patient missed Friday session of dialysis She has been non-tachycardic since admission, HR in 90s Euvolemic on physical exam  Patient weight 170 lb today, was discharged on 10/12/2023 at 151 lb No hypotension, no JVD, no muffed heart sounds. No tachycardia or signs of poor perfusion. Overall, low suspicion for cardiac tamponade  Patient is likely going to need dialysis  Currently on 2 L supplemental oxygen  Pending results of updated echocardiogram  Patients symptoms likely driven by missed dialysis session  Hypertension  Most recent BP 150/110 Home medications include: amlodipine 10 mg daily, CoReg 25 mg BID, losartan 50 mg daily, PO Lasix 80 mg TID  Per primary ESRD on HD (M/W/F), lupus nephritis  Chronic back pain Anemia of CKD  Risk Assessment/Risk Scores:       New York Heart Association (NYHA) Functional Class NYHA Class II   For questions or updates, please contact [facility] Please consult [email or web address] for contact info under    Signed, Olena Leatherwood, PA-C  10/24/2023 11:41 AM  ADDENDUM:   Patient seen and examined I personally taken a history, examined the patient, reviewed relevant notes,  laboratory data / imaging studies.  I performed a substantive portion of this encounter and formulated the important aspects of the plan.  I agree with the APP's note, impression, and recommendations; however,  I have edited the note to reflect changes or salient points.   Patient presents to the hospital with a chief complaint of shortness of breath.  Initial workup in the emergency room department included lab work, EKG, and chest x-ray.  Patient skipped dialysis this past Friday, 10/22/2023.  Has become  progressively short of breath.  EKG notes sinus rhythm without underlying injury pattern and chest x-ray concerning for possible pericardial effusion and therefore cardiology is consulted for further assistance.  Patient is normotensive and not tachycardic on vital signs.  Patient is resting in bed comfortably.  Denies anginal chest pain.  Endorses orthopnea, PND.  No lower extremity swelling, near-syncope or syncopal event.  Review of Systems  Cardiovascular:  Positive for orthopnea and palpitations. Negative for chest pain, claudication, irregular heartbeat, leg swelling, near-syncope, paroxysmal nocturnal dyspnea and syncope.  Respiratory:  Positive for shortness of breath.   Hematologic/Lymphatic: Negative for bleeding problem.  Musculoskeletal:  Positive for back pain.   PHYSICAL EXAM: Today's Vitals   10/24/23 1001 10/24/23 1015 10/24/23 1200 10/24/23 1204  BP:  (!) 150/110 (!) 151/119   Pulse:  98 87   Resp:  (!) 24 (!) 21   Temp: 97.6 F (36.4 C)   (!) 97.3 F (36.3 C)  TempSrc: Oral   Oral  SpO2:  100% 99%   Weight:      Height:      PainSc:       Body mass index is 27.44 kg/m.   Net IO Since Admission: No IO data has been entered for this period [10/24/23 1221]  Filed Weights   10/24/23 0739  Weight: 77.1 kg    Physical Exam  Constitutional: No distress. She appears chronically ill.  hemodynamically stable  Neck: No JVD present.  Cardiovascular: Normal rate, regular rhythm, S1 normal and S2 normal. Exam reveals no gallop, no S3 and no S4.  No murmur heard. Pulmonary/Chest: Effort normal. No stridor. She has no rales. She has scattered wheezes.  Abdominal: Soft. She exhibits no distension. There is no abdominal tenderness.  Musculoskeletal:        General: No edema.     Cervical back: Neck supple.  Skin: Skin is warm.    EKG: (personally reviewed by me) 10/24/2023: Sinus rhythm, 88 bpm, biatrial enlargement, no underlying injury pattern, baseline artifact  in leads I and aVL leads cannot rule out ischemia,  Telemetry: (personally reviewed by me) Sinus rhythm   Impression:  Shortness of breath Pericardial effusion -concern for End-stage renal disease on hemodialysis Benign essential hypertension with chronic kidney disease stage V on ESRD History of lupus nephritis  Recommendations:  Patient presents to the hospital for progressive shortness of breath after missing hemodialysis on Friday, 10/22/2023.  She endorses orthopnea and PND but no significant lower extremity swelling.  Chest x-ray noted concerns for possible pericardial effusion.  Prior echocardiograms have noted trivial pericardial effusion.  Hemodynamics did not illustrate tachycardia or hypotension.  Low probability for cardiac tamponade.  Recommend limited echocardiogram to reevaluate for pericardial effusion and tamponade physiology.  Personally reviewed her echocardiogram from January 2025.  She does have what appears to be salt-and-pepper appearance of the myocardium recommend outpatient workup for possible infiltrative cardiomyopathy such as amyloidosis.  She follows up with Dr. Cristal Deer End in [place].  Volume management per nephrology.  With regards to blood pressure management recommend restarting home blood pressure medications-defer to primary team  Echocardiogram is pending.  Will follow patient with you.  This note was created  using a voice recognition software as a result there may be grammatical errors inadvertently enclosed that do not reflect the nature of this encounter. Every attempt is made to correct such errors.   Tessa Lerner, DO, [facility]  [address] [place], [place] [address] Pager: [phone] Office: [phone] 10/24/2023 12:21 PM

## 2023-10-24 NOTE — Consult Note (Signed)
 Renal Service Consult Note [name]  Linda [name] 10/24/2023 Linda Krabbe, MD Requesting Physician: Dr. Rush Landmark  Reason for Consult: ESRD pt w/ SOB HPI: The patient is a 28 y.o. year-old w/ PMH as below who presented to ED c/o SOB. Missed dialysis Friday d/t transportation issues. Also lower back pain and indigestion. 94% on RA w/ ems. In ED BP 150/116, HR 92, RR 18-24, afeb 98.0 F.  2 L 99%. K 4.6 BUN 65 creat 12.3 Hb 10  Wbc 9K. Pt rec'd tylenol, Coreg 25mg  x 1, ntg paste, azithromycin and IV rocephin. Pt was admitted. We are asked to see for dialysis.    Pt seen in ED. Pt is frustrated that she is having volume issues now when she has been on HD for 4 years and never had this before.  States she is voiding w/ lasix as much as before. Not sure if she could be losing body wt or not. Hasn't seen a rheumatologist for some time, she will have to do that outpatient. Her relative on the phone is worried about a family member who had a PE and pulm HTN and maybe the patient has these problems?  Pt denies any GI issues. No chest pain.   She had echo in Jan 2025 which showed LVEF 55%, basal lateral hypokinesis. Severe LVH, indeterminate diast filling. RV function wnl, nl size. L atrium severely dilated. Mild MR, no notable AV regurg. IVC w/ > 50% resp variability c/w RA pressure of 3 mm hg.   PMH ESRD on HD  HTN Lupus MCTD   ROS - denies CP, no joint pain, no HA, no blurry vision, no rash, no diarrhea, no nausea/ vomiting, no dysuria, no difficulty voiding   Past Medical History  Past Medical History:  Diagnosis Date   Abnormal ECG    a. In setting of LVH w/ repolarization abnormalities; b. 03/2019 MV: No ischemia/infarct. EF 55-65%.   Angioedema    ESRD (end stage renal disease) (HCC)    on HD (M,W,F)   GERD (gastroesophageal reflux disease)    Hypertension    Lupus    LVH (left ventricular hypertrophy)    a. 11/2017 Echo: EF 50-55%. triv AI. Mild MR. Mod L and  mild R atrial enlargement. Nl RV size/fxn. Small pericardial effusion w/o tampondae; b. 03/2018 Echo: EF 55-60%, restrictive filling pattern. Nl RV size/fxn. Sev LAE. Mild MR, mild to mod TR/PR. Mod PAH. Triv effusion.   Migraines    Mixed connective tissue disease (HCC)    Previous Dialysis patient [facility])    a. Off HD since late 2020.   Septic prepatellar bursitis of right knee 08/19/2017   Past Surgical History  Past Surgical History:  Procedure Laterality Date   GRAFT APPLICATION Left 03/30/2019   Procedure: BRACHIAL CEPHALIC GRAFT CREATION;  Surgeon: Annice Needy, MD;  Location: [facility] ORS;  Service: Vascular;  Laterality: Left;   I & D EXTREMITY Right 08/06/2017   Procedure: IRRIGATION AND DEBRIDEMENT KNEE;  Surgeon: Roby Lofts, MD;  Location: [facility] OR;  Service: Orthopedics;  Laterality: Right;   IR THROMBECTOMY AV FISTULA W/THROMBOLYSIS/PTA/STENT INC/SHUNT/IMG LT Left 05/18/2022   IR US GUIDE VASC ACCESS LEFT  05/18/2022   MULTIPLE TOOTH EXTRACTIONS     Family History  Family History  Problem Relation Age of Onset   Lupus Mother    Hypertension Mother    Kidney disease Mother    Heart Problems Mother    Coronary artery disease Mother 70  stent   Diabetes Maternal Grandmother    Social History  reports that she has been smoking e-cigarettes. She started smoking about 8 years ago. She has a 2.2 pack-year smoking history. She has never used smokeless tobacco. She reports that she does not drink alcohol and does not use drugs. Allergies  Allergies  Allergen Reactions   Lisinopril Anaphylaxis    Angioedema   Gabapentin Other (See Comments)    Reaction: Tremor (intolerance); tremor   Ativan [Lorazepam] Other (See Comments)    Somnolent with 1mg  ativan at [facility] Nov 2019   Hydroxychloroquine Other (See Comments)    Pt reports making her skin peel really bad   Vicodin [Hydrocodone-Acetaminophen] Itching and Rash   Home medications Prior to Admission medications    Medication Sig Start Date End Date Taking? Authorizing Provider  acetaminophen (TYLENOL) 325 MG tablet Take 2 tablets (650 mg total) by mouth every 6 (six) hours as needed for mild pain (pain score 1-3) or moderate pain (pain score 4-6) (or Fever >/= 101). 09/13/23   Hongalgi, Maximino Greenland, MD  amLODipine (NORVASC) 10 MG tablet Take 10 mg by mouth daily. 08/05/21   [provider]  calcitRIOL (ROCALTROL) 0.25 MCG capsule Take 1 tablet by mouth daily. Patient not taking: Reported on 10/10/2023    [provider]  carvedilol (COREG) 25 MG tablet Take 1 tablet (25 mg total) by mouth 2 (two) times daily with a meal. 09/03/23   Rhetta Mura, MD  diclofenac Sodium (VOLTAREN) 1 % GEL Apply 2 g topically 4 (four) times daily. Patient not taking: Reported on 10/10/2023 09/23/23   Long, Arlyss Repress, MD  dicyclomine (BENTYL) 10 MG capsule Take 1 capsule (10 mg total) by mouth 4 (four) times daily -  before meals and at bedtime for 5 days. Abdominal cramps. 10/12/23 10/17/23  Pokhrel, Rebekah Chesterfield, MD  furosemide (LASIX) 80 MG tablet Take 80 mg by mouth 3 (three) times daily. 07/30/23   [provider]  losartan (COZAAR) 50 MG tablet Take 1 tablet (50 mg total) by mouth daily. 10/02/21 08/31/24  Lynn Ito, MD  medroxyPROGESTERone (DEPO-PROVERA) 150 MG/ML injection Inject 1 mL (150 mg total) into the muscle every 3 (three) months. 10/29/22   Brock Bad, MD  methocarbamol (ROBAXIN) 500 MG tablet Take 1 tablet (500 mg total) by mouth every 8 (eight) hours as needed for muscle spasms. 10/12/23   Pokhrel, Rebekah Chesterfield, MD  naloxone [facility]) nasal spray 4 mg/0.1 mL Place 1 spray into the nose once. If needed for emergency overdose.    [provider]  pantoprazole (PROTONIX) 40 MG tablet Take 1 tablet (40 mg total) by mouth daily as needed (for acid reflux). 09/13/23 10/13/23  Hongalgi, Maximino Greenland, MD  RENVELA 2.4 g PACK Take 2.4 g by mouth See admin instructions. Take with a large meal once a day to once  every other day. 12/03/22   [provider]  Safety Seal Miscellaneous MISC Use on the alternate nights when you are not applying tretinoin (Tuesday, Thursday, Saturday, and Sunday) 05/24/23   Terri Piedra, DO  sodium bicarbonate 650 MG tablet Take 1 tablet (650 mg total) by mouth 2 (two) times daily. 09/05/21   Leroy Sea, MD  tiZANidine (ZANAFLEX) 4 MG tablet Take 4 mg by mouth 3 (three) times daily. 10/08/23   [provider]  Vitamin D, Ergocalciferol, (DRISDOL) 1.25 MG (50000 UNIT) CAPS capsule ergocalciferol (vitamin D2) 1,250 mcg (50,000 unit) capsule TAKE 1 CAPSULE BY MOUTH WEEKLY  03/11/21   [provider]     Vitals:   10/24/23 1001 10/24/23 1015 10/24/23 1200 10/24/23 1204  BP:  (!) 150/110 (!) 151/119   Pulse:  98 87   Resp:  (!) 24 (!) 21   Temp: 97.6 F (36.4 C)   (!) 97.3 F (36.3 C)  TempSrc: Oral   Oral  SpO2:  100% 99%   Weight:      Height:       Exam Gen alert, no distress, 2L [employer] O2 (not on home O2) No rash, cyanosis or gangrene Sclera anicteric, throat clear  No jvd or bruits Chest bilateral basilar crackles 1/4 up, no wheezing RRR no RG Abd soft ntnd no mass or ascites +bs GU defer MS no joint effusions or deformity Ext trace LE edema, no other edema Neuro is alert, Ox 3 , nf    LUA AVF+ bruit       Renal-related home meds: - norvasc 10 - rocaltrol 0.25 every day - coreg 25 bid - lasix 80 tid - losartan 50 every day - renvela 2.4 gm ac tid - sod bicarb 650 bid    OP HD: [facility] MWF jan 2025 --> 3.5h   350/500   73.9kg   2/2 bath  LUA AVF   Heparin none    CXR 2/23 - sig vasc congestion and bilat early perihilar IS edema   BP 150/ 110, HR 94, RR 18- 24  temp 98    Na 141 K 4.6  CO2 17  BUN 65  creat 12.3   Hb 9.2   Assessment/ Plan: SOB/ acute hypoxic resp failure/ volume - looks like pulm edema again by CXR. Requiring 2 L [place] in ED. Mild edema LE's, +rales. No ^wob. Not sure cause - losing body wt, not voiding  like she used to (lasix- assisted), other cardiac or pulm cause? Had echo last month.  ESRD - on HD MWF. Next HD tonight upstairs as a priority given her CXR changes and O2 requirement.  HTN - uncont HTN, get vol down, cont home meds Anemia of esrd - Hb 10.3, no esa needs at this time.  Secondary hyperparathyroidism - Ca in range, add phos/ albumin, cont binders w/ meals Lupus - hasn't seen her rheum MD in a while      Vinson Moselle  MD [facility] 10/24/2023, 12:17 PM  Recent Labs  Lab 10/24/23 0813  HGB 10.3*  CALCIUM 9.3  CREATININE 12.34*  K 4.6   Inpatient medications:

## 2023-10-24 NOTE — ED Triage Notes (Signed)
 Pt bib ems from home c.o sob, missed dialysis Friday due to transportation issues. Pt also c.o chronic lower back pain and indigestion. Resp e.u, 94% on room air with ems. Pt a.o

## 2023-10-24 NOTE — ED Notes (Signed)
 RN placed on 2L of O2 per PA for comfort

## 2023-10-25 ENCOUNTER — Inpatient Hospital Stay (HOSPITAL_COMMUNITY): Payer: 59

## 2023-10-25 DIAGNOSIS — R0602 Shortness of breath: Secondary | ICD-10-CM | POA: Diagnosis not present

## 2023-10-25 DIAGNOSIS — Z992 Dependence on renal dialysis: Secondary | ICD-10-CM

## 2023-10-25 DIAGNOSIS — N186 End stage renal disease: Secondary | ICD-10-CM

## 2023-10-25 DIAGNOSIS — I5032 Chronic diastolic (congestive) heart failure: Secondary | ICD-10-CM | POA: Diagnosis not present

## 2023-10-25 DIAGNOSIS — I3139 Other pericardial effusion (noninflammatory): Secondary | ICD-10-CM

## 2023-10-25 DIAGNOSIS — M3214 Glomerular disease in systemic lupus erythematosus: Secondary | ICD-10-CM

## 2023-10-25 LAB — CBC
HCT: 33.4 % — ABNORMAL LOW (ref 36.0–46.0)
Hemoglobin: 10.5 g/dL — ABNORMAL LOW (ref 12.0–15.0)
MCH: 29.4 pg (ref 26.0–34.0)
MCHC: 31.4 g/dL (ref 30.0–36.0)
MCV: 93.6 fL (ref 80.0–100.0)
Platelets: 237 10*3/uL (ref 150–400)
RBC: 3.57 MIL/uL — ABNORMAL LOW (ref 3.87–5.11)
RDW: 16.1 % — ABNORMAL HIGH (ref 11.5–15.5)
WBC: 7.4 10*3/uL (ref 4.0–10.5)
nRBC: 0 % (ref 0.0–0.2)

## 2023-10-25 LAB — COMPREHENSIVE METABOLIC PANEL
ALT: 8 U/L (ref 0–44)
AST: 13 U/L — ABNORMAL LOW (ref 15–41)
Albumin: 2.9 g/dL — ABNORMAL LOW (ref 3.5–5.0)
Alkaline Phosphatase: 64 U/L (ref 38–126)
Anion gap: 14 (ref 5–15)
BUN: 24 mg/dL — ABNORMAL HIGH (ref 6–20)
CO2: 26 mmol/L (ref 22–32)
Calcium: 9.6 mg/dL (ref 8.9–10.3)
Chloride: 96 mmol/L — ABNORMAL LOW (ref 98–111)
Creatinine, Ser: 6.34 mg/dL — ABNORMAL HIGH (ref 0.44–1.00)
GFR, Estimated: 8 mL/min — ABNORMAL LOW (ref >60)
Glucose, Bld: 82 mg/dL (ref 70–99)
Potassium: 3.7 mmol/L (ref 3.5–5.1)
Sodium: 136 mmol/L (ref 135–145)
Total Bilirubin: 0.7 mg/dL (ref 0.0–1.2)
Total Protein: 7.3 g/dL (ref 6.5–8.1)

## 2023-10-25 LAB — ECHOCARDIOGRAM LIMITED
Calc EF: 43.7 %
Height: 66 in
Single Plane A2C EF: 46.4 %
Single Plane A4C EF: 41.1 %
Weight: 2720 [oz_av]

## 2023-10-25 LAB — MAGNESIUM: Magnesium: 1.9 mg/dL (ref 1.7–2.4)

## 2023-10-25 LAB — RESPIRATORY PANEL BY PCR

## 2023-10-25 LAB — SARS CORONAVIRUS 2 BY RT PCR: SARS Coronavirus 2 by RT PCR: NEGATIVE

## 2023-10-25 LAB — PHOSPHORUS: Phosphorus: 5 mg/dL — ABNORMAL HIGH (ref 2.5–4.6)

## 2023-10-25 MED ORDER — CHLORHEXIDINE GLUCONATE CLOTH 2 % EX PADS
6.0000 | MEDICATED_PAD | Freq: Every day | CUTANEOUS | Status: DC
  Administered 2023-10-25: 6 via TOPICAL

## 2023-10-25 MED ORDER — FUROSEMIDE 40 MG PO TABS
80.0000 mg | ORAL_TABLET | Freq: Three times a day (TID) | ORAL | Status: DC
  Administered 2023-10-25 – 2023-10-28 (×8): 80 mg via ORAL
  Filled 2023-10-25 (×2): qty 2
  Filled 2023-10-25: qty 4
  Filled 2023-10-25 (×6): qty 2

## 2023-10-25 MED ORDER — PANTOPRAZOLE SODIUM 40 MG PO TBEC
40.0000 mg | DELAYED_RELEASE_TABLET | Freq: Every day | ORAL | Status: DC | PRN
  Administered 2023-10-25: 40 mg via ORAL
  Filled 2023-10-25: qty 1

## 2023-10-25 MED ORDER — TECHNETIUM TO 99M ALBUMIN AGGREGATED
4.1000 | Freq: Once | INTRAVENOUS | Status: AC | PRN
  Administered 2023-10-25: 4.1 via INTRAVENOUS

## 2023-10-25 MED ORDER — NICOTINE 7 MG/24HR TD PT24
7.0000 mg | MEDICATED_PATCH | Freq: Every day | TRANSDERMAL | Status: DC
  Administered 2023-10-25 – 2023-10-28 (×4): 7 mg via TRANSDERMAL
  Filled 2023-10-25 (×5): qty 1

## 2023-10-25 MED ORDER — LOSARTAN POTASSIUM 50 MG PO TABS
50.0000 mg | ORAL_TABLET | Freq: Every day | ORAL | Status: DC
  Administered 2023-10-25: 50 mg via ORAL
  Filled 2023-10-25: qty 1

## 2023-10-25 NOTE — Progress Notes (Signed)
 Rounding Note    Patient Name: Linda [name] Date of Encounter: 10/25/2023  [facility] Cardiologist: Yvonne Kendall, MD ***  Subjective   ***  Inpatient Medications    Scheduled Meds:  amLODipine  10 mg Oral Daily   carvedilol  25 mg Oral BID WC   Chlorhexidine Gluconate Cloth  6 each Topical Daily   heparin  5,000 Units Subcutaneous Q12H   sevelamer carbonate  2.4 g Oral Q lunch   sodium bicarbonate  650 mg Oral BID   sodium chloride flush  3 mL Intravenous Q12H   sodium chloride flush  3 mL Intravenous Q12H   trimethobenzamide  200 mg Intramuscular Once in dialysis   Continuous Infusions:  sodium chloride     PRN Meds: sodium chloride, hydrALAZINE, morphine injection, oxyCODONE, sodium chloride flush   Vital Signs    Vitals:   10/25/23 0019 10/25/23 0108 10/25/23 0220 10/25/23 0420  BP: (!) 155/118 (!) 168/128 (!) 163/119 (!) 165/114  Pulse: 80     Resp: 17     Temp:  98.9 F (37.2 C)    TempSrc:  Oral    SpO2: 100% 98%    Weight:      Height:        Intake/Output Summary (Last 24 hours) at 10/25/2023 0756 Last data filed at 10/25/2023 0019 Gross per 24 hour  Intake 240 ml  Output 350 ml  Net -110 ml      10/24/2023    7:39 AM 10/12/2023    9:39 AM 10/12/2023    5:00 AM  Last 3 Weights  Weight (lbs) 170 lb 151 lb 12.8 oz 149 lb 0.5 oz  Weight (kg) 77.111 kg 68.856 kg 67.6 kg      Telemetry    *** - Personally Reviewed  ECG    *** - Personally Reviewed  Physical Exam  *** GEN: No acute distress.   Neck: No JVD Cardiac: RRR, no murmurs, rubs, or gallops.  Respiratory: Clear to auscultation bilaterally. GI: Soft, nontender, non-distended  MS: No edema; No deformity. Neuro:  Nonfocal  Psych: Normal affect   Labs    High Sensitivity Troponin:   Recent Labs  Lab 10/24/23 1002 10/24/23 1346  TROPONINIHS 51* 45*     Chemistry Recent Labs  Lab 10/24/23 0813 10/24/23 1346 10/25/23 0431  NA 141  --  136  K 4.6  --   3.7  CL 104  --  96*  CO2 17*  --  26  GLUCOSE 82  --  82  BUN 65*  --  24*  CREATININE 12.34* 12.90* PENDING  CALCIUM 9.3  --  9.6  MG  --   --  1.9  PROT  --  7.3 7.3  ALBUMIN  --  2.9* 2.9*  AST  --  14* 13*  ALT  --  8 8  ALKPHOS  --  63 64  BILITOT  --  0.9 0.7  GFRNONAA 4* 4* 8*  ANIONGAP 20*  --  14    Lipids No results for input(s): "CHOL", "TRIG", "HDL", "LABVLDL", "LDLCALC", "CHOLHDL" in the last 168 hours.  Hematology Recent Labs  Lab 10/24/23 0813 10/24/23 1346 10/25/23 0431  WBC 9.5 10.4 7.4  RBC 3.45* 3.06* 3.57*  HGB 10.3* 9.2* 10.5*  HCT 33.4* 29.7* 33.4*  MCV 96.8 97.1 93.6  MCH 29.9 30.1 29.4  MCHC 30.8 31.0 31.4  RDW 16.8* 16.9* 16.1*  PLT 234 227 237   Thyroid  Recent  Labs  Lab 10/24/23 1346  TSH 1.081  FREET4 0.79    BNPNo results for input(s): "BNP", "PROBNP" in the last 168 hours.  DDimer No results for input(s): "DDIMER" in the last 168 hours.   Radiology    DG Chest 2 View Result Date: 10/24/2023 CLINICAL DATA:  28 year old female with shortness of breath, missed dialysis. EXAM: CHEST - 2 VIEW COMPARISON:  Portable chest 10/12/2023 and earlier. FINDINGS: Seated upright AP and lateral views at 0839 hours. Pronounced increased cardiac silhouette, suspicious for pericardial effusion. Superimposed increased indistinct pulmonary vascular congestion. Only trace pleural fluid in the minor fissures. Lung base atelectasis. No consolidation. No pneumothorax. Visualized tracheal air column is within normal limits. No acute osseous abnormality identified. Negative visible bowel gas. IMPRESSION: 1. Acutely increased cardiac silhouette suspicious for Pericardial Effusion. 2. Evidence of acute interstitial pulmonary edema but only trace pleural fluid. Electronically Signed   By: Odessa Fleming M.D.   On: 10/24/2023 09:12    Cardiac Studies   Echo 09/12/23: 1. Basal lateral hypokinesis. . Left ventricular ejection fraction, by  estimation, is 55%. The left  ventricle has normal function. There is  severe concentric left ventricular hypertrophy. Indeterminate diastolic  filling due to E-A fusion.   2. Right ventricular systolic function is normal. The right ventricular  size is normal.   3. Left atrial size was severely dilated.   4. Minimally restricted motion of MV.. Mild mitral valve regurgitation.   5. The aortic valve is tricuspid. Aortic valve regurgitation is not  visualized.   6. The inferior vena cava is normal in size with greater than 50%  respiratory variability, suggesting right atrial pressure of 3 mmHg.   Patient Profile     28 y.o. female with a hx of ESRD on HD (M/W/F), lupus nephritis, chronic diastolic heart failure, hypertension, anemia of chronic disease who is being seen for the evaluation of shortness of breath with pericardial effusion   Assessment & Plan    Shortness of breath Acute Hypoxemic Respiratory Failure Acute on chronic HFrEF - after missing HD on Friday (MWF) - VQ scan with no PE - viral panel pending - 2D echo with EF 40-45% with GHK and severe LVH>>EF decreased from 09/2023 at 55% - RAP estimated at - Trivial pericardial effusion unchanged from prior echo - volume management per nephrology along with Lasix - no SGLT2i given ESRD - continue Carvedilol 25mg  BID and Losartan 50mg  daily - needs better BP control>>consider addition of Bidil but will defer to nephrology  ESRD Pericardial effusion - trivial on echo today and unchanged form echo 09/2023 - effusion likely uremic - continue HD per renal and encouraged compliance as well as Lasix   Anemia of chronic disease - Hb stable at 10.5  HTN -BP poorly controlled on exam today -continue Amlodipine 10mg  daily, Carvedilol 25mg  BID, Losartan 50mg  daily -consider addition of Hydralazine/Imdur>>will defer to nephrology       For questions or updates, please contact [facility] Please consult [email or web address] for contact info under         Signed, Marcelino Duster, PA  10/25/2023, 7:56 AM

## 2023-10-25 NOTE — Progress Notes (Signed)
 Heart Failure Navigator Progress Note  Assessed for Heart & Vascular TOC clinic readiness.  Patient does not meet criteria due to ESRD on hemodialysis.   Navigator will sign off at this time.   Rhae Hammock, BSN, Scientist, clinical (histocompatibility and immunogenetics) Only

## 2023-10-25 NOTE — Progress Notes (Signed)
 CSW received consult for patient. CSW spoke with patient at bedside. Marland Kitchen PTA patient reports she comes from home alone and has support of grandparents. Consult received for transportation for HD. Patient states she uses [facility] [place] however, they sometime don't pick her up. CSW offered patient transportation resources and [employer] resources. Patient accepted both resources. All questions answered. No further questions reported at this time.

## 2023-10-25 NOTE — Progress Notes (Signed)
 [facility] Progress Note  Name: Linda [name] MRN: [id number] DOB: September 21, 1995  Chief Complaint:  Shortness of breath   Subjective:  Got HD overnight - she had just 350 mL UF with HD.  She states that her cousin just had the flu and just got out of the hospital with the same.  Team is going to order resp viral panel.  She reports she had quite a bit of cramping yesterday with HD.  Discussed HD compliance.   Review of systems:  She reports shortness of breath; no cp  Some nausea no vomiting  --------------  Background on consult:  HPI: The patient is a 28 y.o. year-old w/ PMH as below who presented to ED c/o SOB. Missed dialysis Friday d/t transportation issues. Also lower back pain and indigestion. 94% on RA w/ ems. In ED BP 150/116, HR 92, RR 18-24, afeb 98.0 F.  2 L 99%. K 4.6 BUN 65 creat 12.3 Hb 10  Wbc 9K. Pt rec'd tylenol, Coreg 25mg  x 1, ntg paste, azithromycin and IV rocephin. Pt was admitted. We are asked to see for dialysis.   Pt seen in ED. Pt is frustrated that she is having volume issues now when she has been on HD for 4 years and never had this before.  States she is voiding w/ lasix as much as before. Not sure if she could be losing body wt or not. Hasn't seen a rheumatologist for some time, she will have to do that outpatient. Her relative on the phone is worried about a family member who had a PE and pulm HTN and maybe the patient has these problems?  Pt denies any GI issues. No chest pain.  She had echo in Jan 2025 which showed LVEF 55%, basal lateral hypokinesis. Severe LVH, indeterminate diast filling. RV function wnl, nl size. L atrium severely dilated. Mild MR, no notable AV regurg. IVC w/ > 50% resp variability c/w RA pressure of 3 mm hg.      Intake/Output Summary (Last 24 hours) at 10/25/2023 0845 Last data filed at 10/25/2023 0019 Gross per 24 hour  Intake 240 ml  Output 350 ml  Net -110 ml    Vitals:  Vitals:   10/25/23 0019 10/25/23 0108  10/25/23 0220 10/25/23 0420  BP: (!) 155/118 (!) 168/128 (!) 163/119 (!) 165/114  Pulse: 80     Resp: 17     Temp:  98.9 F (37.2 C)    TempSrc:  Oral    SpO2: 100% 98%    Weight:      Height:         Physical Exam:  General adult female in bed in no acute distress HEENT normocephalic atraumatic extraocular movements intact sclera anicteric Neck supple trachea midline Lungs clear to auscultation bilaterally normal work of breathing at rest on 2 liters oxygen Heart S1S2 no rub Abdomen soft nontender nondistended Extremities no edema  Psych normal mood and affect Neuro - alert and oriented x3 provides hx and follows commands Access LUE AVF with bruit and thrill   Medications reviewed   Labs:     Latest Ref Rng & Units 10/25/2023    4:31 AM 10/24/2023    1:46 PM 10/24/2023    8:13 AM  BMP  Glucose 70 - 99 mg/dL 82   82   BUN 6 - 20 mg/dL 24   65   Creatinine 4.09 - 1.00 mg/dL PENDING  81.19  14.78   Sodium 135 - 145 mmol/L  136   141   Potassium 3.5 - 5.1 mmol/L 3.7   4.6   Chloride 98 - 111 mmol/L 96   104   CO2 22 - 32 mmol/L 26   17   Calcium 8.9 - 10.3 mg/dL 9.6   9.3    Outpatient HD orders:  [facility] MWF jan 2025 --> 3.5h   350/500   73.9kg   2/2 bath  LUA AVF   Heparin none      Assessment/Plan:   SOB/ acute hypoxic resp failure/ volume - Requiring 2 L [place].  Team is sending resp viral panel.  Mild edema LE's, note minimal UF with urgent HD Sunday. S/p echo last month.  ESRD - on HD MWF schedule (got most recent treatment overnight).  Next HD on 2/26, Wed.   HTN - uncontrolled. Optimize volume with HD. Continue home meds Anemia of esrd - Hb 10.5, no esa needs at this time.  Secondary hyperparathyroidism - hyperphos - cont binders w/ meals Lupus - hasn't seen her rheum MD in a while  Disposition - per primary team   [name], MD 10/25/2023 9:05 AM

## 2023-10-25 NOTE — TOC Initial Note (Signed)
 Transition of Care [facility]) - Initial/Assessment Note    Patient Details  Name: Linda [name] MRN: [id number] Date of Birth: 26-Dec-1995  Transition of Care [facility]) CM/SW Contact:    Gala Lewandowsky, RN Phone Number: 10/25/2023, 3:34 PM  Clinical Narrative: Risk for readmission assessment completed. Patient presented for shortness of breath. PTA patient was from home alone and has support of grandparents. Consult received for transportation for HD. Patient states she uses [employer] [facility] [place] however, they sometime don't pick her up. Clinical Social Worker to speak with patient regarding community resources. Patient states that family will assist with transportation home. No further needs identified at this time.                 Expected Discharge Plan: Home/Self Care Barriers to Discharge: Continued Medical Work up   Patient Goals and CMS Choice Patient states their goals for this hospitalization and ongoing recovery are:: to return back home.   Expected Discharge Plan and Services In-house Referral: NA Discharge Planning Services: CM Consult   Living arrangements for the past 2 months: Single Family Home                   DME Agency: NA    Prior Living Arrangements/Services Living arrangements for the past 2 months: Single Family Home Lives with:: Self (has support of grandparents.) Patient language and need for interpreter reviewed:: Yes        Need for Family Participation in Patient Care: No (Comment) Care giver support system in place?: No (comment)   Criminal Activity/Legal Involvement Pertinent to Current Situation/Hospitalization: No - Comment as needed  Activities of Daily Living   ADL Screening (condition at time of admission) Independently performs ADLs?: Yes (appropriate for developmental age) Is the patient deaf or have difficulty hearing?: No Does the patient have difficulty seeing, even when wearing glasses/contacts?: No Does  the patient have difficulty concentrating, remembering, or making decisions?: No  Permission Sought/Granted Permission sought to share information with : Case Manager, Family Supports     Emotional Assessment Appearance:: Appears stated age Attitude/Demeanor/Rapport: Engaged Affect (typically observed): Appropriate Orientation: : Oriented to Self, Oriented to Place, Oriented to  Time, Oriented to Situation Alcohol / Substance Use: Not Applicable Psych Involvement: No (comment)  Admission diagnosis:  Pericardial effusion [I31.39] Acute pulmonary edema (HCC) [J81.0] SOB (shortness of breath) [R06.02] Pleural effusion [J90] Patient Active Problem List   Diagnosis Date Noted   SOB (shortness of breath) 10/24/2023   Pericardial effusion 10/24/2023   Lupus nephritis (HCC) 10/24/2023   ESRD (end stage renal disease) on dialysis (HCC) 10/24/2023   Chronic heart failure with preserved ejection fraction (HCC) 10/24/2023   Acute hypoxic respiratory failure (HCC) 10/10/2023   CAP (community acquired pneumonia) 10/10/2023   Acute on chronic diastolic CHF (congestive heart failure) (HCC) 10/10/2023   History of anemia due to chronic kidney disease 10/10/2023   Pulmonary edema 09/11/2023   Shortness of breath 09/11/2023   Pain in multiple muscles 09/11/2023   Nausea vomiting and diarrhea 09/01/2023   ESRD (end stage renal disease) (HCC) 09/30/2021   Acute pulmonary edema (HCC) 09/30/2021   GERD without esophagitis 09/30/2021   Volume overload 09/02/2021   High anion gap metabolic acidosis 09/02/2021   Macrocytic anemia 09/02/2021   Elevated troponin 09/02/2021   Anxiety 11/03/2019   Strain of sternocleidomastoid muscle 11/03/2019   Difficulty with speech 11/03/2019   Prolonged QT interval 03/02/2019   Chronic heart failure with preserved ejection fraction (  HFpEF) (HCC) 03/02/2019   ESRD on dialysis (HCC) 01/10/2019   Calciphylaxis 12/27/2018   Tobacco abuse 03/02/2018   Severe sepsis  (HCC) 01/06/2018   Angioedema 01/02/2018   Tachycardia 01/02/2018   Chronic kidney disease, stage 5 (HCC)    Hypertensive urgency 11/29/2017   Grief reaction 08/19/2017   Right knee pain    Bilateral knee swelling 04/20/2017   Systemic lupus erythematosus (HCC)    Essential hypertension 12/11/2016   Mixed connective tissue disease (HCC) 02/11/2016   Implanon in place 01/19/2014   PCP:  Pcp, No Pharmacy:   [id number] [place], [place] - [address] [place] [address] Phone: [phone] Fax: [phone]  Social Drivers of Health (SDOH) Social History: SDOH Screenings   Food Insecurity: No Food Insecurity (10/24/2023)  Housing: Low Risk  (10/24/2023)  Transportation Needs: No Transportation Needs (10/24/2023)  Utilities: Not At Risk (10/24/2023)  Recent Concern: Utilities - At Risk (10/10/2023)  Depression (PHQ2-9): Low Risk  (02/23/2020)  Financial Resource Strain: Low Risk  (09/10/2023)   Received from [facility]  Social Connections: Unknown (01/12/2022)   Received from [facility]  Tobacco Use: High Risk (10/24/2023)   Readmission Risk Interventions    10/25/2023    3:32 PM 10/02/2021    9:11 AM 10/01/2021    2:34 PM  Readmission Risk Prevention Plan  Transportation Screening Complete  Complete  PCP or Specialist Appt within 3-5 Days  Complete --  HRI or Home Care Consult Complete Complete Not Complete  HRI or Home Care Consult comments   Lives with grandmother. No recommendations from PT/OT  Social Work Consult for Recovery Care Planning/Counseling Complete  Complete  Palliative Care Screening Complete  Not Applicable  Medication Review (RN Care Manager) Referral to Pharmacy  Complete

## 2023-10-25 NOTE — Plan of Care (Signed)
 Gave patient multiple educational handouts in regards to heart failure per request. Education link in chart.

## 2023-10-25 NOTE — Progress Notes (Signed)
 PROGRESS NOTE  ISMELDA WEATHERMAN ZOX:[id number] DOB: [birth year]   PCP: Pcp, No  Patient is from: Home.  DOA: 10/24/2023 LOS: 1  Chief complaints Chief Complaint  Patient presents with   Shortness of Breath     Brief Narrative / Interim history: 28 year old F with PMH of lupus nephritis/ESRD on HD MWF, calciphylaxis, combined CHF, HTN, AICD, tobacco use disorder and recent hospitalization from 2/9-2/11 for acute hypoxic RF in the setting of missed dialysis and pneumonia returning with shortness of breath.  Reports missing dialysis on Friday due to transportation issue.  She also reports exposure to flu.   In ED, blood pressure elevated but saturating at 100% on RA.  Labs consistent with ESRD without hyperkalemia or severe azotemia.  CXR raised concern for interstitial pulmonary edema and possible pericardial effusion.  Cardiology and nephrology consulted.  Echocardiogram and VQ scan ordered.    TTE with LVEF of 40 to 45% (55% in 09/2023), GH, severe concentric LVH, trivial pericardial effusion.  Underwent dialysis but not able to remove fluid due to abdominal cramping.  VQ scan pending.  Subjective: Seen and examined earlier this morning.  She had hemodialysis last night but not able to take fluid off due to abdominal cramping.  Continues to endorse shortness of breath.  She denies runny nose or sore throat.  She denies chest pain or GI symptoms.  Objective: Vitals:   10/25/23 0108 10/25/23 0220 10/25/23 0420 10/25/23 0847  BP: (!) 168/128 (!) 163/119 (!) 165/114 (!) 163/126  Pulse:      Resp:    20  Temp: 98.9 F (37.2 C)     TempSrc: Oral     SpO2: 98%     Weight:      Height:        Examination:  GENERAL: No apparent distress.  Nontoxic. HEENT: MMM.  Vision and hearing grossly intact.  NECK: Supple.  No apparent JVD.  RESP:  No IWOB.  Fair aeration bilaterally. CVS:  RRR. Heart sounds normal.  ABD/GI/GU: BS+. Abd soft, NTND.  MSK/EXT:  Moves extremities. No apparent  deformity. No edema.  SKIN: no apparent skin lesion or wound.  Calciphylaxis on arms and right thigh NEURO: Awake, alert and oriented appropriately.  No apparent focal neuro deficit. PSYCH: Calm. Normal affect.   Procedures:  None  Microbiology summarized: COVID-19 PCR negative At 20 pathogen RVP pending  Assessment and plan: Acute on chronic combined CHF: Recurrent hospitalization with CHF exacerbation due to missed dialysis.  Reports missing dialysis due to transportation issue.  X-ray raises concern for pericardial effusion and acute interstitial pulmonary edema with trace pleural effusion.  TTE with LVEF of 40 to 45% (55% in 09/2023), GH, severe concentric LVH, trivial pericardial effusion.  VQ scan negative.  No oxygen requirement.  She reports exposure to flu.  COVID-19 PCR negative.  Low suspicion for bacterial pneumonia. -Resume home torsemide.  Reports making urine with torsemide. -Dialysis per nephrology -Follow a 20 pathogen RVP -Cardiology and nephrology on board  ESRD due to lupus nephritis: On HD MWF.  Missed last HD on Friday due to transportation issue. -HD per nephrology.  Had HD overnight but not able to remove volume due to abdominal cramping  Anemia of renal disease: stable Recent Labs    09/03/23 0637 09/11/23 1223 09/11/23 1601 09/12/23 1042 10/10/23 0201 10/10/23 0206 10/11/23 0419 10/24/23 0813 10/24/23 1346 10/25/23 0431  HGB 9.9* 10.3* 10.7* 10.3* 10.6* 11.6* 10.7* 10.3* 9.2* 10.5*   -Continue monitoring  Chronic pain/calciphylaxis-seems  like she is on oxycodone 15 mg IR.  Per narcotic database, last fill on 08/01/2023 for #150, 30-day supply.  She says she was dismissed by the previous prescriber after she got tramadol prescription from her dentist, and she is in the process of establishing care with another doctor for pain management.  She has been stretching her oxycodone until she see her new doctor.  Has already been started on oxycodone on  admission.  Tobacco use disorder: Reports smoking about 3 cigarettes a day.  -Encouraged smoking cessation -Interested in low-dose nicotine patch   Body mass index is 27.44 kg/m.          DVT prophylaxis:  heparin injection 5,000 Units Start: 10/24/23 1300  Code Status: Full code Family Communication: None at bedside Level of care: Telemetry Cardiac Status is: Inpatient Remains inpatient appropriate because: Acute CHF   Final disposition: Home Consultants:  Cardiology Nephrology  55 minutes with more than 50% spent in reviewing records, counseling patient/family and coordinating care.   Sch Meds:  Scheduled Meds:  amLODipine  10 mg Oral Daily   carvedilol  25 mg Oral BID WC   Chlorhexidine Gluconate Cloth  6 each Topical Daily   heparin  5,000 Units Subcutaneous Q12H   sevelamer carbonate  2.4 g Oral Q lunch   sodium chloride flush  3 mL Intravenous Q12H   sodium chloride flush  3 mL Intravenous Q12H   trimethobenzamide  200 mg Intramuscular Once in dialysis   Continuous Infusions:  sodium chloride     PRN Meds:.sodium chloride, hydrALAZINE, morphine injection, oxyCODONE, sodium chloride flush  Antimicrobials: Anti-infectives (From admission, onward)    None        I have personally reviewed the following labs and images: CBC: Recent Labs  Lab 10/24/23 0813 10/24/23 1346 10/25/23 0431  WBC 9.5 10.4 7.4  HGB 10.3* 9.2* 10.5*  HCT 33.4* 29.7* 33.4*  MCV 96.8 97.1 93.6  PLT 234 227 237   BMP &GFR Recent Labs  Lab 10/24/23 0813 10/24/23 1346 10/25/23 0431  NA 141  --  136  K 4.6  --  3.7  CL 104  --  96*  CO2 17*  --  26  GLUCOSE 82  --  82  BUN 65*  --  24*  CREATININE 12.34* 12.90* 6.34*  CALCIUM 9.3  --  9.6  MG  --   --  1.9  PHOS  --   --  5.0*   Estimated Creatinine Clearance: 13.8 mL/min (A) (by C-G formula based on SCr of 6.34 mg/dL (H)). Liver & Pancreas: Recent Labs  Lab 10/24/23 1346 10/25/23 0431  AST 14* 13*  ALT 8  8  ALKPHOS 63 64  BILITOT 0.9 0.7  PROT 7.3 7.3  ALBUMIN 2.9* 2.9*   No results for input(s): "LIPASE", "AMYLASE" in the last 168 hours. No results for input(s): "AMMONIA" in the last 168 hours. Diabetic: No results for input(s): "HGBA1C" in the last 72 hours. No results for input(s): "GLUCAP" in the last 168 hours. Cardiac Enzymes: No results for input(s): "CKTOTAL", "CKMB", "CKMBINDEX", "TROPONINI" in the last 168 hours. No results for input(s): "PROBNP" in the last 8760 hours. Coagulation Profile: No results for input(s): "INR", "PROTIME" in the last 168 hours. Thyroid Function Tests: Recent Labs    10/24/23 1346  TSH 1.081  FREET4 0.79   Lipid Profile: No results for input(s): "CHOL", "HDL", "LDLCALC", "TRIG", "CHOLHDL", "LDLDIRECT" in the last 72 hours. Anemia Panel: No results for input(s): "VITAMINB12", "  FOLATE", "FERRITIN", "TIBC", "IRON", "RETICCTPCT" in the last 72 hours. Urine analysis:    Component Value Date/Time   COLORURINE YELLOW 09/11/2023 1413   APPEARANCEUR HAZY (A) 09/11/2023 1413   LABSPEC 1.008 09/11/2023 1413   PHURINE 9.0 (H) 09/11/2023 1413   GLUCOSEU NEGATIVE 09/11/2023 1413   HGBUR NEGATIVE 09/11/2023 1413   BILIRUBINUR NEGATIVE 09/11/2023 1413   BILIRUBINUR Negative 02/10/2018 1602   KETONESUR NEGATIVE 09/11/2023 1413   PROTEINUR >=300 (A) 09/11/2023 1413   UROBILINOGEN 0.2 02/10/2018 1602   UROBILINOGEN 1.0 03/30/2015 2328   NITRITE NEGATIVE 09/11/2023 1413   LEUKOCYTESUR NEGATIVE 09/11/2023 1413   Sepsis Labs: Invalid input(s): "PROCALCITONIN", "LACTICIDVEN"  Microbiology: Recent Results (from the past 240 hours)  SARS Coronavirus 2 by RT PCR (hospital order, performed in [facility] hospital lab) *cepheid single result test* Anterior Nasal Swab     Status: None   Collection Time: 10/25/23  9:01 AM   Specimen: Anterior Nasal Swab  Result Value Ref Range Status   SARS Coronavirus 2 by RT PCR NEGATIVE NEGATIVE Final    Comment:  Performed at [facility], [address], [place], [place] [address]    Radiology Studies: ECHOCARDIOGRAM LIMITED Result Date: 10/25/2023    ECHOCARDIOGRAM LIMITED REPORT   Patient Name:   WISDOM SEYBOLD Date of Exam: 10/25/2023 Medical Rec #:  [id number]         Height:       66.0 in Accession #:    [id number]        Weight:       170.0 lb Date of Birth:  12-Jul-1996         BSA:          1.866 m Patient Age:    27 years          BP:           165/119 mmHg Patient Gender: F                 HR:           75 bpm. Exam Location:  Inpatient Procedure: Limited Echo (Both Spectral and Color Flow Doppler were utilized            during procedure). Indications:    I31.3 Pericardial effusion  History:        Patient has prior history of Echocardiogram examinations, most                 recent 09/12/2023. CHF, Lupus; Risk Factors:Hypertension.  Sonographer:    Irving Burton Senior RDCS Referring Phys: [id number] Dolphus Jenny  Sonographer Comments: Limited effusion check IMPRESSIONS  1. Left ventricular ejection fraction, by estimation, is 40 to 45%. Left ventricular ejection fraction by 2D MOD biplane is 43.7 %. The left ventricle has mildly decreased function. The left ventricle demonstrates global hypokinesis. There is severe concentric left ventricular hypertrophy.  2. Right ventricular systolic function is normal. The right ventricular size is normal.  3. Left atrial size was mildly dilated.  4. The aortic valve is tricuspid.  5. The inferior vena cava is normal in size with greater than 50% respiratory variability, suggesting right atrial pressure of 3 mmHg.  6. Trivial effusion is unchanged from prior. \\ FINDINGS  Left Ventricle: Left ventricular ejection fraction, by estimation, is 40 to 45%. Left ventricular ejection fraction by 2D MOD biplane is 43.7 %. The left ventricle has mildly decreased function. The left ventricle demonstrates global hypokinesis. There is severe concentric left  ventricular hypertrophy.  Right Ventricle: The right ventricular size is normal. Right ventricular systolic function is normal. Left Atrium: Left atrial size was mildly dilated. Right Atrium: Right atrial size was normal in size. Pericardium: Trivial effusion is unchanged from prior. Trivial pericardial effusion is present. Aortic Valve: The aortic valve is tricuspid. Venous: The inferior vena cava is normal in size with greater than 50% respiratory variability, suggesting right atrial pressure of 3 mmHg. LEFT VENTRICLE PLAX 2D                        Biplane EF (MOD) LV PW:         1.90 cm         LV Biplane EF:   Left LV IVS:        1.80 cm                          ventricular                                                 ejection                                                 fraction by LV Volumes (MOD)                                2D MOD LV vol d, MOD    149.0 ml                       biplane is A2C:                                            43.7 %. LV vol d, MOD    141.0 ml A4C: LV vol s, MOD    79.8 ml A2C: LV vol s, MOD    83.0 ml A4C: LV SV MOD A2C:   69.2 ml LV SV MOD A4C:   141.0 ml LV SV MOD BP:    64.3 ml LEFT ATRIUM         Index LA diam:    5.30 cm 2.84 cm/m Dalton McleanMD Electronically signed by Wilfred Lacy Signature Date/Time: 10/25/2023/9:15:53 AM    Final       Kema Santaella T. Andros Channing [facility]  If 7PM-7AM, please contact night-coverage [email or web address] 10/25/2023, 12:07 PM

## 2023-10-25 NOTE — Progress Notes (Signed)
 Echocardiogram 2D Echocardiogram has been performed.  Warren Lacy Sadie Hazelett RDCS 10/25/2023, 9:02 AM

## 2023-10-26 ENCOUNTER — Telehealth: Payer: Self-pay | Admitting: Cardiology

## 2023-10-26 DIAGNOSIS — I43 Cardiomyopathy in diseases classified elsewhere: Secondary | ICD-10-CM

## 2023-10-26 DIAGNOSIS — I5023 Acute on chronic systolic (congestive) heart failure: Secondary | ICD-10-CM | POA: Diagnosis not present

## 2023-10-26 DIAGNOSIS — N186 End stage renal disease: Secondary | ICD-10-CM | POA: Diagnosis not present

## 2023-10-26 DIAGNOSIS — I5032 Chronic diastolic (congestive) heart failure: Secondary | ICD-10-CM | POA: Diagnosis not present

## 2023-10-26 DIAGNOSIS — I110 Hypertensive heart disease with heart failure: Secondary | ICD-10-CM

## 2023-10-26 DIAGNOSIS — R0602 Shortness of breath: Secondary | ICD-10-CM | POA: Diagnosis not present

## 2023-10-26 DIAGNOSIS — I10 Essential (primary) hypertension: Secondary | ICD-10-CM

## 2023-10-26 DIAGNOSIS — M3214 Glomerular disease in systemic lupus erythematosus: Secondary | ICD-10-CM | POA: Diagnosis not present

## 2023-10-26 DIAGNOSIS — R7989 Other specified abnormal findings of blood chemistry: Secondary | ICD-10-CM | POA: Diagnosis not present

## 2023-10-26 DIAGNOSIS — I3139 Other pericardial effusion (noninflammatory): Secondary | ICD-10-CM | POA: Diagnosis not present

## 2023-10-26 LAB — BASIC METABOLIC PANEL
Anion gap: 10 (ref 5–15)
BUN: 41 mg/dL — ABNORMAL HIGH (ref 6–20)
CO2: 24 mmol/L (ref 22–32)
Calcium: 9 mg/dL (ref 8.9–10.3)
Chloride: 96 mmol/L — ABNORMAL LOW (ref 98–111)
Creatinine, Ser: 9.16 mg/dL — ABNORMAL HIGH (ref 0.44–1.00)
GFR, Estimated: 6 mL/min — ABNORMAL LOW (ref >60)
Glucose, Bld: 76 mg/dL (ref 70–99)
Potassium: 4.1 mmol/L (ref 3.5–5.1)
Sodium: 130 mmol/L — ABNORMAL LOW (ref 135–145)

## 2023-10-26 MED ORDER — IRBESARTAN 300 MG PO TABS: 300.0000 mg | ORAL_TABLET | Freq: Every day | ORAL | 0 refills | Status: DC

## 2023-10-26 MED ORDER — IRBESARTAN 150 MG PO TABS
300.0000 mg | ORAL_TABLET | Freq: Every day | ORAL | Status: DC
  Administered 2023-10-26 – 2023-10-28 (×3): 300 mg via ORAL
  Filled 2023-10-26 (×3): qty 2

## 2023-10-26 MED ORDER — ONDANSETRON HCL 4 MG/2ML IJ SOLN
4.0000 mg | Freq: Four times a day (QID) | INTRAMUSCULAR | Status: DC | PRN
  Administered 2023-10-26: 4 mg via INTRAVENOUS
  Filled 2023-10-26: qty 2

## 2023-10-26 NOTE — Progress Notes (Signed)
 Mobility Specialist Progress Note:  Nurse requested Mobility Specialist to perform oxygen saturation test with pt which includes removing pt from oxygen both at rest and while ambulating.  Below are the results from that testing.     Patient Saturations on Room Air at Rest = spO2 100%  Patient Saturations on Room Air while Ambulating = sp02 95% .    At end of testing pt left in room on 0  Liters of oxygen.  Reported results to nurse.    Thompson Grayer Mobility Specialist  Please contact vis Secure Chat or  Rehab Office [phone]

## 2023-10-26 NOTE — Telephone Encounter (Signed)
 Hey can you please get patient set up with Dr. Duke Salvia for HTN clinic?

## 2023-10-26 NOTE — Progress Notes (Signed)
 [facility] Progress Note  Name: Linda [name] MRN: [id number] DOB: 1996/02/17  Chief Complaint:  Shortness of breath   Subjective:  Last HD on late 2/23 with 350 mL UF with HD (she states was limited by cramping).  Resp viral panel and covid tests were negative.  Cardiology is changing her to irbesartan 300 mg daily.  Cardiology suspects EF is now decreased due to hypertensive CM.  No labs today.  No urine output is charted.  She states that her oxygen was just weaned off.  She states that she still makes a good bit of urine.   Review of systems:   She reports shortness of breath - this is improved from before She has been ambulatory in the room  Hx some nausea no vomiting  --------------  Background on consult:  HPI: The patient is a 28 y.o. year-old w/ PMH as below who presented to ED c/o SOB. Missed dialysis Friday d/t transportation issues. Also lower back pain and indigestion. 94% on RA w/ ems. In ED BP 150/116, HR 92, RR 18-24, afeb 98.0 F.  2 L 99%. K 4.6 BUN 65 creat 12.3 Hb 10  Wbc 9K. Pt rec'd tylenol, Coreg 25mg  x 1, ntg paste, azithromycin and IV rocephin. Pt was admitted. We are asked to see for dialysis.   Pt seen in ED. Pt is frustrated that she is having volume issues now when she has been on HD for 4 years and never had this before.  States she is voiding w/ lasix as much as before. Not sure if she could be losing body wt or not. Hasn't seen a rheumatologist for some time, she will have to do that outpatient. Her relative on the phone is worried about a family member who had a PE and pulm HTN and maybe the patient has these problems?  Pt denies any GI issues. No chest pain.  She had echo in Jan 2025 which showed LVEF 55%, basal lateral hypokinesis. Severe LVH, indeterminate diast filling. RV function wnl, nl size. L atrium severely dilated. Mild MR, no notable AV regurg. IVC w/ > 50% resp variability c/w RA pressure of 3 mm hg.      Intake/Output Summary  (Last 24 hours) at 10/26/2023 1045 Last data filed at 10/26/2023 0900 Gross per 24 hour  Intake 660 ml  Output --  Net 660 ml    Vitals:  Vitals:   10/25/23 1501 10/25/23 2020 10/26/23 0636 10/26/23 0859  BP: (!) 141/114 (!) 143/111 (!) 142/116 (!) 144/90  Pulse: 73     Resp: 18 18 (!) 24   Temp: 97.8 F (36.6 C) 98.4 F (36.9 C) 98 F (36.7 C)   TempSrc: Oral Oral Oral   SpO2: 100% 100% 96%   Weight:   74.7 kg   Height:         Physical Exam:   General adult female in bed in no acute distress HEENT normocephalic atraumatic extraocular movements intact sclera anicteric Neck supple trachea midline Lungs clear to auscultation bilaterally normal work of breathing at rest on room air  Heart S1S2 no rub Abdomen soft nontender nondistended Extremities trace edema  Psych normal mood and affect Neuro - alert and oriented x3 provides hx and follows commands Access LUE AV graft with bruit and thrill   Medications reviewed   Labs:     Latest Ref Rng & Units 10/25/2023    4:31 AM 10/24/2023    1:46 PM 10/24/2023    8:13  AM  BMP  Glucose 70 - 99 mg/dL 82   82   BUN 6 - 20 mg/dL 24   65   Creatinine 1.19 - 1.00 mg/dL 1.47  82.95  62.13   Sodium 135 - 145 mmol/L 136   141   Potassium 3.5 - 5.1 mmol/L 3.7   4.6   Chloride 98 - 111 mmol/L 96   104   CO2 22 - 32 mmol/L 26   17   Calcium 8.9 - 10.3 mg/dL 9.6   9.3    Outpatient HD orders:  [facility] MWF jan 2025 --> 3.5h   350/500   73.9kg   2/2 bath  LUA AVF   Heparin none      Assessment/Plan:   SOB/ acute hypoxic resp failure/ volume - Requiring 2 L [place].  Resp viral panel and covid negative.  Mild edema LE's, note minimal UF with urgent HD Sunday. S/p echo last month.  ESRD -  Transition back to HD per MWF schedule. Renal panel in AM and BMP now.  Watch K.  Note home regimen for lasix is TID and has no UOP charted.  Ordered strict ins/outs HTN - uncontrolled. Optimize volume with HD. Cardiology has changed her to irbesartan   Anemia of esrd - Hb 10.5, no esa needs at this time.  Secondary hyperparathyroidism - hyperphosphatemia - cont renvela w/ meals Lupus - hasn't seen her rheum MD in a while  Disposition - per primary team. Stable from a strictly renal standpoint.   [name], MD 10/26/2023 11:03 AM

## 2023-10-26 NOTE — Progress Notes (Signed)
 Pt receives out-pt HD at [facility] [place] on MWF. Will assist as needed.   Olivia Canter Renal Navigator [phone]

## 2023-10-26 NOTE — Consult Note (Signed)
 [facility] Liaison Consult Note   10/26/2023  KADIJAH SHAMOON 1995-11-21 [id number]  Insurance: [employer] Complete   Primary Care Provider: Pcp, No  *Patient states her PCP is with [facility].  She sees Reubin Milan, PA-C with [facility] [this is not a [facility] affiliate provider]    [facility] Liaison met patient at bedside at [facility]. Patient on the high risk [facility] banner    The patient was screened for 30 day readmission hospitalization with noted high risk score for unplanned readmission risk 4 hospital admissions in 6 months.  The patient was assessed for potential Community Care Coordination service needs for post hospital transition for care coordination. Review of patient's electronic medical record reveals patient is active with [facility] for HD. She voices concerns for apartment cost.   Plan: Patient was given information to reach out to the [employer] from Ruel Favors, RN [facility] CCM rounding with this Clinical research associate.  Referral request for community care coordination: Will check for TOC follow up.   [facility] Community Care, Population Health does not replace or interfere with any arrangements made by the Inpatient Transition of Care team.   For questions contact:   Charlesetta Shanks, RN, BSN, CCM [facility], [facility] Liaison Direct Dial: [phone] or secure chat Email: [email or web address]

## 2023-10-26 NOTE — Discharge Planning (Addendum)
 [facility] Patient Discharge Orders- [facility] CLINIC: [facility]  Patient's name: TEQUIA WOLMAN Admit/DC Dates: 10/24/2023 -10/26/2023   Discharge Diagnoses: Acute on chronic HFrEF  Volume overload  Aranesp: Given: No   Date and amount of last dose: NA  Last Hgb: 10.5 PRBC's Given: No Date/# of units: NA ESA dose for discharge: mircera 60 mcg IV q 2 weeks  IV Iron dose at discharge: Per protocol  Heparin change: No  EDW Change: Yes  New EDW: 71.5 kg   Bath Change: No  Access intervention/Change: NA Details:  Hectorol/Calcitriol change: Yes corrected calcium 10.5 decrease hectorol to 7 mcg IV three times per week   Discharge Labs: Calcium 9.6 Phosphorus 5.0 Albumin 2.9 K+ 4.1  IV Antibiotics: No Details:  On Coumadin?: No Last INR: Next INR: Managed By:   OTHER/APPTS/LAB ORDERS:    D/C Meds to be reconciled by nurse after every discharge.  Completed By: Alonna Buckler [facility] [phone]  Reviewed by: MD:______ RN_______

## 2023-10-26 NOTE — Progress Notes (Signed)
 D/C order noted. Contacted [facility] to be advised of pt's d/c today and that pt should resume care tomorrow.   Olivia Canter Renal Navigator [phone]

## 2023-10-26 NOTE — Progress Notes (Signed)
 Rounding Note    Patient Name: Linda [name] Date of Encounter: 10/26/2023  [facility] Cardiologist: Yvonne Kendall, MD   Subjective   No CP this am.  SOB much improved.  Inpatient Medications    Scheduled Meds:  amLODipine  10 mg Oral Daily   carvedilol  25 mg Oral BID WC   Chlorhexidine Gluconate Cloth  6 each Topical Daily   furosemide  80 mg Oral TID   heparin  5,000 Units Subcutaneous Q12H   losartan  50 mg Oral Daily   nicotine  7 mg Transdermal Daily   sevelamer carbonate  2.4 g Oral Q lunch   sodium chloride flush  3 mL Intravenous Q12H   sodium chloride flush  3 mL Intravenous Q12H   trimethobenzamide  200 mg Intramuscular Once in dialysis   Continuous Infusions:   PRN Meds: hydrALAZINE, morphine injection, ondansetron (ZOFRAN) IV, oxyCODONE, pantoprazole, sodium chloride flush   Vital Signs    Vitals:   10/25/23 0847 10/25/23 1501 10/25/23 2020 10/26/23 0636  BP: (!) 163/126 (!) 141/114 (!) 143/111 (!) 142/116  Pulse:  73    Resp: 20 18 18  (!) 24  Temp:  97.8 F (36.6 C) 98.4 F (36.9 C) 98 F (36.7 C)  TempSrc: Oral Oral Oral Oral  SpO2:  100% 100% 96%  Weight:    74.7 kg  Height:        Intake/Output Summary (Last 24 hours) at 10/26/2023 0753 Last data filed at 10/25/2023 2230 Gross per 24 hour  Intake 420 ml  Output --  Net 420 ml      10/26/2023    6:36 AM 10/24/2023    7:39 AM 10/12/2023    9:39 AM  Last 3 Weights  Weight (lbs) 164 lb 9.6 oz 170 lb 151 lb 12.8 oz  Weight (kg) 74.662 kg 77.111 kg 68.856 kg      Telemetry    NSR- Personally Reviewed  ECG    No new EKG to review - Personally Reviewed  Physical Exam   GEN: Well nourished, well developed in no acute distress HEENT: Normal NECK: No JVD; No carotid bruits LYMPHATICS: No lymphadenopathy CARDIAC:RRR, no murmurs, rubs, gallops RESPIRATORY:  Clear to auscultation without rales, wheezing or rhonchi  ABDOMEN: Soft, non-tender,  non-distended MUSCULOSKELETAL:  No edema; No deformity  SKIN: Warm and dry NEUROLOGIC:  Alert and oriented x 3 PSYCHIATRIC:  Normal affect  Labs    High Sensitivity Troponin:   Recent Labs  Lab 10/24/23 1002 10/24/23 1346  TROPONINIHS 51* 45*     Chemistry Recent Labs  Lab 10/24/23 0813 10/24/23 1346 10/25/23 0431  NA 141  --  136  K 4.6  --  3.7  CL 104  --  96*  CO2 17*  --  26  GLUCOSE 82  --  82  BUN 65*  --  24*  CREATININE 12.34* 12.90* 6.34*  CALCIUM 9.3  --  9.6  MG  --   --  1.9  PROT  --  7.3 7.3  ALBUMIN  --  2.9* 2.9*  AST  --  14* 13*  ALT  --  8 8  ALKPHOS  --  63 64  BILITOT  --  0.9 0.7  GFRNONAA 4* 4* 8*  ANIONGAP 20*  --  14    Lipids No results for input(s): "CHOL", "TRIG", "HDL", "LABVLDL", "LDLCALC", "CHOLHDL" in the last 168 hours.  Hematology Recent Labs  Lab 10/24/23 0813 10/24/23 1346 10/25/23  0431  WBC 9.5 10.4 7.4  RBC 3.45* 3.06* 3.57*  HGB 10.3* 9.2* 10.5*  HCT 33.4* 29.7* 33.4*  MCV 96.8 97.1 93.6  MCH 29.9 30.1 29.4  MCHC 30.8 31.0 31.4  RDW 16.8* 16.9* 16.1*  PLT 234 227 237   Thyroid  Recent Labs  Lab 10/24/23 1346  TSH 1.081  FREET4 0.79    BNPNo results for input(s): "BNP", "PROBNP" in the last 168 hours.  DDimer No results for input(s): "DDIMER" in the last 168 hours.   Radiology    VAS [place] LOWER EXTREMITY VENOUS (DVT) Result Date: 10/25/2023  Lower Venous DVT Study Patient Name:  Linda [name]  Date of Exam:   10/25/2023 Medical Rec #: [id number]          Accession #:    [id number] Date of Birth: July 15, 1996          Patient Gender: F Patient Age:   28 years Exam Location:  [facility] Procedure:      VAS [place] LOWER EXTREMITY VENOUS (DVT) Referring Phys: EKTA PATEL --------------------------------------------------------------------------------  Indications: SOB, and CHF.  Comparison Study: Previous study on the right lower extremity on 2.13.2018. Performing Technologist: Fernande Bras  Examination  Guidelines: A complete evaluation includes B-mode imaging, spectral Doppler, color Doppler, and power Doppler as needed of all accessible portions of each vessel. Bilateral testing is considered an integral part of a complete examination. Limited examinations for reoccurring indications may be performed as noted. The reflux portion of the exam is performed with the patient in reverse Trendelenburg.  +---------+---------------+---------+-----------+----------+--------------+ RIGHT    CompressibilityPhasicitySpontaneityPropertiesThrombus Aging +---------+---------------+---------+-----------+----------+--------------+ CFV      Full           Yes      Yes                                 +---------+---------------+---------+-----------+----------+--------------+ SFJ      Full           Yes      Yes                                 +---------+---------------+---------+-----------+----------+--------------+ FV Prox  Full                                                        +---------+---------------+---------+-----------+----------+--------------+ FV Mid   Full                                                        +---------+---------------+---------+-----------+----------+--------------+ FV DistalFull                                                        +---------+---------------+---------+-----------+----------+--------------+ PFV      Full                                                        +---------+---------------+---------+-----------+----------+--------------+  POP      Full           Yes      Yes                                 +---------+---------------+---------+-----------+----------+--------------+ PTV      Full                                                        +---------+---------------+---------+-----------+----------+--------------+ PERO     Full                                                         +---------+---------------+---------+-----------+----------+--------------+   +---------+---------------+---------+-----------+----------+--------------+ LEFT     CompressibilityPhasicitySpontaneityPropertiesThrombus Aging +---------+---------------+---------+-----------+----------+--------------+ CFV      Full           Yes      Yes                                 +---------+---------------+---------+-----------+----------+--------------+ SFJ      Full           Yes      Yes                                 +---------+---------------+---------+-----------+----------+--------------+ FV Prox  Full                                                        +---------+---------------+---------+-----------+----------+--------------+ FV Mid   Full                                                        +---------+---------------+---------+-----------+----------+--------------+ FV DistalFull                                                        +---------+---------------+---------+-----------+----------+--------------+ PFV      Full                                                        +---------+---------------+---------+-----------+----------+--------------+ POP      Full           Yes      Yes                                 +---------+---------------+---------+-----------+----------+--------------+  PTV      Full                                                        +---------+---------------+---------+-----------+----------+--------------+ PERO     Full                                                        +---------+---------------+---------+-----------+----------+--------------+     Summary: BILATERAL: - No evidence of deep vein thrombosis seen in the lower extremities, bilaterally. -No evidence of popliteal cyst, bilaterally.   *See table(s) above for measurements and observations. Electronically signed by Gerarda Fraction on 10/25/2023 at 3:41:03 PM.     Final    NM Pulmonary Perfusion Result Date: 10/25/2023 CLINICAL DATA:  Shortness of breath. Concern for pulmonary embolism. EXAM: NUCLEAR MEDICINE PERFUSION LUNG SCAN TECHNIQUE: Perfusion images were obtained in multiple projections after intravenous injection of radiopharmaceutical. Ventilation scans intentionally deferred if perfusion scan and chest x-ray adequate for interpretation during COVID 19 epidemic. RADIOPHARMACEUTICALS:  4.1 mCi Tc-94m MAA IV COMPARISON:  Chest radiograph dated 10/24/2023. FINDINGS: There is uniform perfusion of the lungs.  No perfusion defect noted. IMPRESSION: Normal perfusion scan. Electronically Signed   By: Elgie Collard M.D.   On: 10/25/2023 12:13   ECHOCARDIOGRAM LIMITED Result Date: 10/25/2023    ECHOCARDIOGRAM LIMITED REPORT   Patient Name:   Linda [name] Date of Exam: 10/25/2023 Medical Rec #:  [id number]         Height:       66.0 in Accession #:    [id number]        Weight:       170.0 lb Date of Birth:  01-Aug-1996         BSA:          1.866 m Patient Age:    27 years          BP:           165/119 mmHg Patient Gender: F                 HR:           75 bpm. Exam Location:  Inpatient Procedure: Limited Echo (Both Spectral and Color Flow Doppler were utilized            during procedure). Indications:    I31.3 Pericardial effusion  History:        Patient has prior history of Echocardiogram examinations, most                 recent 09/12/2023. CHF, Lupus; Risk Factors:Hypertension.  Sonographer:    Irving Burton Senior RDCS Referring Phys: [id number] Dolphus Jenny  Sonographer Comments: Limited effusion check IMPRESSIONS  1. Left ventricular ejection fraction, by estimation, is 40 to 45%. Left ventricular ejection fraction by 2D MOD biplane is 43.7 %. The left ventricle has mildly decreased function. The left ventricle demonstrates global hypokinesis. There is severe concentric left ventricular hypertrophy.  2. Right ventricular systolic function is normal. The right  ventricular size is normal.  3. Left atrial size was mildly dilated.  4. The aortic valve is tricuspid.  5. The  inferior vena cava is normal in size with greater than 50% respiratory variability, suggesting right atrial pressure of 3 mmHg.  6. Trivial effusion is unchanged from prior. \\ FINDINGS  Left Ventricle: Left ventricular ejection fraction, by estimation, is 40 to 45%. Left ventricular ejection fraction by 2D MOD biplane is 43.7 %. The left ventricle has mildly decreased function. The left ventricle demonstrates global hypokinesis. There is severe concentric left ventricular hypertrophy. Right Ventricle: The right ventricular size is normal. Right ventricular systolic function is normal. Left Atrium: Left atrial size was mildly dilated. Right Atrium: Right atrial size was normal in size. Pericardium: Trivial effusion is unchanged from prior. Trivial pericardial effusion is present. Aortic Valve: The aortic valve is tricuspid. Venous: The inferior vena cava is normal in size with greater than 50% respiratory variability, suggesting right atrial pressure of 3 mmHg. LEFT VENTRICLE PLAX 2D                        Biplane EF (MOD) LV PW:         1.90 cm         LV Biplane EF:   Left LV IVS:        1.80 cm                          ventricular                                                 ejection                                                 fraction by LV Volumes (MOD)                                2D MOD LV vol d, MOD    149.0 ml                       biplane is A2C:                                            43.7 %. LV vol d, MOD    141.0 ml A4C: LV vol s, MOD    79.8 ml A2C: LV vol s, MOD    83.0 ml A4C: LV SV MOD A2C:   69.2 ml LV SV MOD A4C:   141.0 ml LV SV MOD BP:    64.3 ml LEFT ATRIUM         Index LA diam:    5.30 cm 2.84 cm/m Dalton McleanMD Electronically signed by Wilfred Lacy Signature Date/Time: 10/25/2023/9:15:53 AM    Final    DG Chest 2 View Result Date: 10/24/2023 CLINICAL DATA:   28 year old female with shortness of breath, missed dialysis. EXAM: CHEST - 2 VIEW COMPARISON:  Portable chest 10/12/2023 and earlier. FINDINGS: Seated upright AP and lateral views at 0839 hours. Pronounced increased cardiac silhouette, suspicious for pericardial effusion. Superimposed increased indistinct pulmonary vascular  congestion. Only trace pleural fluid in the minor fissures. Lung base atelectasis. No consolidation. No pneumothorax. Visualized tracheal air column is within normal limits. No acute osseous abnormality identified. Negative visible bowel gas. IMPRESSION: 1. Acutely increased cardiac silhouette suspicious for Pericardial Effusion. 2. Evidence of acute interstitial pulmonary edema but only trace pleural fluid. Electronically Signed   By: Odessa Fleming M.D.   On: 10/24/2023 09:12    Cardiac Studies   Echo 09/12/23: 1. Basal lateral hypokinesis. . Left ventricular ejection fraction, by  estimation, is 55%. The left ventricle has normal function. There is  severe concentric left ventricular hypertrophy. Indeterminate diastolic  filling due to E-A fusion.   2. Right ventricular systolic function is normal. The right ventricular  size is normal.   3. Left atrial size was severely dilated.   4. Minimally restricted motion of MV.. Mild mitral valve regurgitation.   5. The aortic valve is tricuspid. Aortic valve regurgitation is not  visualized.   6. The inferior vena cava is normal in size with greater than 50%  respiratory variability, suggesting right atrial pressure of 3 mmHg.   Patient Profile     28 y.o. female with a hx of ESRD on HD (M/W/F), lupus nephritis, chronic diastolic heart failure, hypertension, anemia of chronic disease who is being seen for the evaluation of shortness of breath with pericardial effusion   Assessment & Plan    Shortness of breath Elevated Troponin Acute Hypoxemic Respiratory Failure Acute on chronic HFrEF - after missing HD on Friday (MWF) - VQ  scan with no PE - viral panel negative -Troponin minimally elevated with flat trend.  This is not consistent with ACS and likely represents demand ischemia in the setting of acute on chronic HFrEF and poorly controlled hypertension in the setting of end-stage renal disease  - 2D echo with EF 40-45% with GHK and severe LVH>>EF decreased from 09/2023 at 55% but was 45-50% in 2023>>suspect that this is related to hypertensive cardiomyopathy as BP is poorly controlled.  Nuclear stress test 2020 in setting of EF 40% showed no ischemia>> no further ischemic workup recommended at this time - RAP estimated at - Trivial pericardial effusion unchanged from prior echo - volume management per nephrology along with Lasix - no SGLT2i given ESRD - needs aggressive control of BP - continue Carvedilol 25mg  BID and amlodipine 10 mg daily - Discussed with nephrology and will change losartan to irbesartan 300 mg daily - Volume management per renal  ESRD Pericardial effusion - trivial on echo this admission and unchanged form echo 09/2023 - effusion likely uremic - HD per nephrology  Anemia of chronic disease - Hb stable at 10.5  HTN -BP remains poorly controlled -Amlodipine 10 mg daily, carvedilol 25 mg twice daily -Will stop losartan and start irbesartan 300 mg daily (discussed with nephrology) -If BP remains elevated will add on BiDil  I spent 35 minutes caring for this patient today face to face, ordering and reviewing labs, reviewing records from 2D echo  seeing the patient, documenting in the record, and discussing medicine changes with nephrology  For questions or updates, please contact [facility] Please consult [email or web address] for contact info under        Signed, Armanda Magic, MD  10/26/2023, 7:53 AM

## 2023-10-26 NOTE — Progress Notes (Signed)
 Patient complains of SOB, placed on oxygen at 1 LPM via [place] for comfort.  Spo2 100% RA prior to being placed on supplemental oxygen.

## 2023-10-26 NOTE — Plan of Care (Signed)
  Problem: Education: Goal: Knowledge of General Education information will improve Description: Including pain rating scale, medication(s)/side effects and non-pharmacologic comfort measures Outcome: Progressing   Problem: Health Behavior/Discharge Planning: Goal: Ability to manage health-related needs will improve Outcome: Progressing   Problem: Clinical Measurements: Goal: Ability to maintain clinical measurements within normal limits will improve Outcome: Progressing Goal: Diagnostic test results will improve Outcome: Progressing   Problem: Pain Managment: Goal: General experience of comfort will improve and/or be controlled Outcome: Progressing   Problem: Skin Integrity: Goal: Risk for impaired skin integrity will decrease Outcome: Progressing   Problem: Activity: Goal: Capacity to carry out activities will improve Outcome: Progressing   Problem: Safety: Goal: Ability to remain free from injury will improve Outcome: Completed/Met

## 2023-10-26 NOTE — Progress Notes (Signed)
 Mobility Specialist Progress Note:   10/26/23 1053  Mobility  Activity Ambulated with assistance in hallway  Level of Assistance Modified independent, requires aide device or extra time  Assistive Device None  Distance Ambulated (ft) 500 ft  Activity Response Tolerated well  Mobility Referral Yes  Mobility visit 1 Mobility  Mobility Specialist Start Time (ACUTE ONLY) 1040  Mobility Specialist Stop Time (ACUTE ONLY) 1053  Mobility Specialist Time Calculation (min) (ACUTE ONLY) 13 min   Received pt in bed having no complaints and agreeable to mobility. Pt was asymptomatic throughout session. Ambulated on RA, VSS. Returned to room w/o fault. Left in BR w/ call bell in reach and all needs met.   Thompson Grayer Mobility Specialist  Please contact vis Secure Chat or  Rehab Office [phone]

## 2023-10-26 NOTE — Progress Notes (Signed)
 PROGRESS NOTE  Linda [name]:[id number] DOB: 07-Mar-1996   PCP: Pcp, No  Patient is from: Home.  DOA: 10/24/2023 LOS: 2  Chief complaints Chief Complaint  Patient presents with   Shortness of Breath     Brief Narrative / Interim history: 28 year old F with PMH of lupus nephritis/ESRD on HD MWF, calciphylaxis, combined CHF, HTN, AICD, tobacco use disorder and recent hospitalization from 2/9-2/11 for acute hypoxic RF in the setting of missed dialysis and pneumonia returning with shortness of breath.  Reports missing dialysis on Friday due to transportation issue.  She also reports exposure to flu.   In ED, blood pressure elevated but saturating at 100% on RA.  Labs consistent with ESRD without hyperkalemia or severe azotemia.  CXR raised concern for interstitial pulmonary edema and possible pericardial effusion.  Cardiology and nephrology consulted.  Echocardiogram and VQ scan ordered.    TTE with LVEF of 40 to 45% (55% in 09/2023), GH, severe concentric LVH, trivial pericardial effusion.  Underwent dialysis but not able to remove fluid due to abdominal cramping.  VQ scan negative for PE.  Allergy adjusting antihypertensive meds.  Likely discharge on 2/26 after HD  Subjective: Seen and examined earlier this morning.  No major events overnight of this morning.  She reports some nausea, vomiting and cramping abdominal pain last night that has improved with nausea medication.  Reports improvement in her breathing.  Objective: Vitals:   10/25/23 2020 10/26/23 0636 10/26/23 0859 10/26/23 1428  BP: (!) 143/111 (!) 142/116 (!) 144/90 (!) 128/99  Pulse:    76  Resp: 18 (!) 24  18  Temp: 98.4 F (36.9 C) 98 F (36.7 C)  98 F (36.7 C)  TempSrc: Oral Oral  Oral  SpO2: 100% 96%  94%  Weight:  74.7 kg    Height:        Examination:  GENERAL: No apparent distress.  Nontoxic. HEENT: MMM.  Vision and hearing grossly intact.  NECK: Supple.  No apparent JVD.  RESP:  No IWOB.  Fair  aeration bilaterally. CVS:  RRR. Heart sounds normal.  ABD/GI/GU: BS+. Abd soft, NTND.  MSK/EXT:  Moves extremities. No apparent deformity. No edema.  SKIN: no apparent skin lesion or wound.  Calciphylaxis on arms and right thigh NEURO: Awake, alert and oriented appropriately.  No apparent focal neuro deficit. PSYCH: Calm. Normal affect.   Procedures:  None  Microbiology summarized: COVID-19 PCR negative A 20 pathogen RVP nonreactive.  Assessment and plan: Acute on chronic combined CHF: Recurrent hospitalization with CHF exacerbation due to missed dialysis.  Reports missing dialysis due to transportation issue.  X-ray raises concern for pericardial effusion and acute interstitial pulmonary edema with trace pleural effusion.  TTE with LVEF of 40 to 45% (55% in 09/2023), GH, severe concentric LVH, trivial pericardial effusion.  VQ scan negative.  COVID-19 and a 20 pathogen RVP nonreactive no oxygen requirement.  She reports exposure to flu.  COVID-19 PCR negative.  Low suspicion for bacterial pneumonia. -Continue home torsemide. -Dialysis/ultrafiltration per nephrology -Cardiology and nephrology on board  ESRD due to lupus nephritis: On HD MWF.  Missed last HD on Friday due to transportation issue.  Had HD on the day of admission but was unable to remove volume due to abdominal cramping. -HD/ultrafiltration per nephrology.   Uncontrolled hypertension: Blood pressure elevated. -Appreciate cardiology recs-continue Coreg and amlodipine, and change losartan to irbesartan -Dialysis/ultrafiltration per nephrology  Anemia of renal disease: stable Recent Labs    09/03/23 0637 09/11/23 1223  09/11/23 1601 09/12/23 1042 10/10/23 0201 10/10/23 0206 10/11/23 0419 10/24/23 0813 10/24/23 1346 10/25/23 0431  HGB 9.9* 10.3* 10.7* 10.3* 10.6* 11.6* 10.7* 10.3* 9.2* 10.5*   -Continue monitoring  Evaded troponin: Likely demand ischemia and delayed clearance.  Doubt ACS.  TTE as above. -Blood  pressure control as above  Chronic pain/calciphylaxis-seems like she is on oxycodone 15 mg IR.  Per narcotic database, last fill on 08/01/2023 for #150, 30-day supply.  She says she was dismissed by the previous prescriber after she got tramadol prescription from her dentist, and she is in the process of establishing care with another doctor for pain management.  She has been stretching her oxycodone until she see her new doctor.  Has already been started on oxycodone on admission.  Tobacco use disorder: Reports smoking about 3 cigarettes a day.  -Encouraged smoking cessation -Interested in low-dose nicotine patch   Body mass index is 26.57 kg/m.          DVT prophylaxis:  heparin injection 5,000 Units Start: 10/24/23 1300  Code Status: Full code Family Communication: None at bedside Level of care: Telemetry Cardiac Status is: Inpatient Remains inpatient appropriate because: Acute CHF and uncontrolled hypertension   Final disposition: Home Consultants:  Cardiology Nephrology  35 minutes with more than 50% spent in reviewing records, counseling patient/family and coordinating care.   Sch Meds:  Scheduled Meds:  amLODipine  10 mg Oral Daily   carvedilol  25 mg Oral BID WC   furosemide  80 mg Oral TID   heparin  5,000 Units Subcutaneous Q12H   irbesartan  300 mg Oral Daily   nicotine  7 mg Transdermal Daily   sevelamer carbonate  2.4 g Oral Q lunch   sodium chloride flush  3 mL Intravenous Q12H   sodium chloride flush  3 mL Intravenous Q12H   trimethobenzamide  200 mg Intramuscular Once in dialysis   Continuous Infusions:   PRN Meds:.hydrALAZINE, ondansetron (ZOFRAN) IV, oxyCODONE, pantoprazole, sodium chloride flush  Antimicrobials: Anti-infectives (From admission, onward)    None        I have personally reviewed the following labs and images: CBC: Recent Labs  Lab 10/24/23 0813 10/24/23 1346 10/25/23 0431  WBC 9.5 10.4 7.4  HGB 10.3* 9.2* 10.5*   HCT 33.4* 29.7* 33.4*  MCV 96.8 97.1 93.6  PLT 234 227 237   BMP &GFR Recent Labs  Lab 10/24/23 0813 10/24/23 1346 10/25/23 0431 10/26/23 1128  NA 141  --  136 130*  K 4.6  --  3.7 4.1  CL 104  --  96* 96*  CO2 17*  --  26 24  GLUCOSE 82  --  82 76  BUN 65*  --  24* 41*  CREATININE 12.34* 12.90* 6.34* 9.16*  CALCIUM 9.3  --  9.6 9.0  MG  --   --  1.9  --   PHOS  --   --  5.0*  --    Estimated Creatinine Clearance: 9.5 mL/min (A) (by C-G formula based on SCr of 9.16 mg/dL (H)). Liver & Pancreas: Recent Labs  Lab 10/24/23 1346 10/25/23 0431  AST 14* 13*  ALT 8 8  ALKPHOS 63 64  BILITOT 0.9 0.7  PROT 7.3 7.3  ALBUMIN 2.9* 2.9*   No results for input(s): "LIPASE", "AMYLASE" in the last 168 hours. No results for input(s): "AMMONIA" in the last 168 hours. Diabetic: No results for input(s): "HGBA1C" in the last 72 hours. No results for input(s): "GLUCAP" in  the last 168 hours. Cardiac Enzymes: No results for input(s): "CKTOTAL", "CKMB", "CKMBINDEX", "TROPONINI" in the last 168 hours. No results for input(s): "PROBNP" in the last 8760 hours. Coagulation Profile: No results for input(s): "INR", "PROTIME" in the last 168 hours. Thyroid Function Tests: Recent Labs    10/24/23 1346  TSH 1.081  FREET4 0.79   Lipid Profile: No results for input(s): "CHOL", "HDL", "LDLCALC", "TRIG", "CHOLHDL", "LDLDIRECT" in the last 72 hours. Anemia Panel: No results for input(s): "VITAMINB12", "FOLATE", "FERRITIN", "TIBC", "IRON", "RETICCTPCT" in the last 72 hours. Urine analysis:    Component Value Date/Time   COLORURINE YELLOW 09/11/2023 1413   APPEARANCEUR HAZY (A) 09/11/2023 1413   LABSPEC 1.008 09/11/2023 1413   PHURINE 9.0 (H) 09/11/2023 1413   GLUCOSEU NEGATIVE 09/11/2023 1413   HGBUR NEGATIVE 09/11/2023 1413   BILIRUBINUR NEGATIVE 09/11/2023 1413   BILIRUBINUR Negative 02/10/2018 1602   KETONESUR NEGATIVE 09/11/2023 1413   PROTEINUR >=300 (A) 09/11/2023 1413    UROBILINOGEN 0.2 02/10/2018 1602   UROBILINOGEN 1.0 03/30/2015 2328   NITRITE NEGATIVE 09/11/2023 1413   LEUKOCYTESUR NEGATIVE 09/11/2023 1413   Sepsis Labs: Invalid input(s): "PROCALCITONIN", "LACTICIDVEN"  Microbiology: Recent Results (from the past 240 hours)  SARS Coronavirus 2 by RT PCR (hospital order, performed in [facility] hospital lab) *cepheid single result test* Anterior Nasal Swab     Status: None   Collection Time: 10/25/23  9:01 AM   Specimen: Anterior Nasal Swab  Result Value Ref Range Status   SARS Coronavirus 2 by RT PCR NEGATIVE NEGATIVE Final    Comment: Performed at [facility], [address], [place], [place] [address]  Respiratory (~20 pathogens) panel by PCR     Status: None   Collection Time: 10/25/23  9:01 AM   Specimen: Nasopharyngeal Swab; Respiratory  Result Value Ref Range Status   Adenovirus NOT DETECTED NOT DETECTED Final   Coronavirus 229E NOT DETECTED NOT DETECTED Final    Comment: (NOTE) The Coronavirus on the Respiratory Panel, DOES NOT test for the novel  Coronavirus (2019 nCoV)    Coronavirus HKU1 NOT DETECTED NOT DETECTED Final   Coronavirus NL63 NOT DETECTED NOT DETECTED Final   Coronavirus OC43 NOT DETECTED NOT DETECTED Final   Metapneumovirus NOT DETECTED NOT DETECTED Final   Rhinovirus / Enterovirus NOT DETECTED NOT DETECTED Final   Influenza A NOT DETECTED NOT DETECTED Final   Influenza B NOT DETECTED NOT DETECTED Final   Parainfluenza Virus 1 NOT DETECTED NOT DETECTED Final   Parainfluenza Virus 2 NOT DETECTED NOT DETECTED Final   Parainfluenza Virus 3 NOT DETECTED NOT DETECTED Final   Parainfluenza Virus 4 NOT DETECTED NOT DETECTED Final   Respiratory Syncytial Virus NOT DETECTED NOT DETECTED Final   Bordetella pertussis NOT DETECTED NOT DETECTED Final   Bordetella Parapertussis NOT DETECTED NOT DETECTED Final   Chlamydophila pneumoniae NOT DETECTED NOT DETECTED Final   Mycoplasma pneumoniae NOT DETECTED NOT DETECTED  Final    Comment: Performed at [facility], [address], [place], [place] [address]    Radiology Studies: No results found.     Beadie Matsunaga T. Collette Pescador [facility]  If 7PM-7AM, please contact night-coverage [email or web address] 10/26/2023, 3:27 PM

## 2023-10-27 ENCOUNTER — Telehealth: Payer: Self-pay | Admitting: Cardiology

## 2023-10-27 DIAGNOSIS — I3139 Other pericardial effusion (noninflammatory): Secondary | ICD-10-CM | POA: Diagnosis not present

## 2023-10-27 DIAGNOSIS — J810 Acute pulmonary edema: Secondary | ICD-10-CM | POA: Diagnosis not present

## 2023-10-27 DIAGNOSIS — I10 Essential (primary) hypertension: Secondary | ICD-10-CM | POA: Diagnosis not present

## 2023-10-27 DIAGNOSIS — R0602 Shortness of breath: Secondary | ICD-10-CM | POA: Diagnosis not present

## 2023-10-27 DIAGNOSIS — J90 Pleural effusion, not elsewhere classified: Secondary | ICD-10-CM | POA: Diagnosis not present

## 2023-10-27 DIAGNOSIS — I5023 Acute on chronic systolic (congestive) heart failure: Secondary | ICD-10-CM | POA: Diagnosis not present

## 2023-10-27 LAB — RENAL FUNCTION PANEL
Albumin: 2.8 g/dL — ABNORMAL LOW (ref 3.5–5.0)
Anion gap: 14 (ref 5–15)
BUN: 51 mg/dL — ABNORMAL HIGH (ref 6–20)
CO2: 24 mmol/L (ref 22–32)
Calcium: 9.5 mg/dL (ref 8.9–10.3)
Chloride: 96 mmol/L — ABNORMAL LOW (ref 98–111)
Creatinine, Ser: 10.88 mg/dL — ABNORMAL HIGH (ref 0.44–1.00)
GFR, Estimated: 4 mL/min — ABNORMAL LOW (ref >60)
Glucose, Bld: 99 mg/dL (ref 70–99)
Phosphorus: 8.8 mg/dL — ABNORMAL HIGH (ref 2.5–4.6)
Potassium: 4 mmol/L (ref 3.5–5.1)
Sodium: 134 mmol/L — ABNORMAL LOW (ref 135–145)

## 2023-10-27 LAB — TROPONIN I (HIGH SENSITIVITY)
Troponin I (High Sensitivity): 33 ng/L — ABNORMAL HIGH (ref <18)
Troponin I (High Sensitivity): 33 ng/L — ABNORMAL HIGH (ref <18)

## 2023-10-27 LAB — CBC
HCT: 34.3 % — ABNORMAL LOW (ref 36.0–46.0)
Hemoglobin: 10.7 g/dL — ABNORMAL LOW (ref 12.0–15.0)
MCH: 30 pg (ref 26.0–34.0)
MCHC: 31.2 g/dL (ref 30.0–36.0)
MCV: 96.1 fL (ref 80.0–100.0)
Platelets: 241 10*3/uL (ref 150–400)
RBC: 3.57 MIL/uL — ABNORMAL LOW (ref 3.87–5.11)
RDW: 15.9 % — ABNORMAL HIGH (ref 11.5–15.5)
WBC: 7.8 10*3/uL (ref 4.0–10.5)
nRBC: 0 % (ref 0.0–0.2)

## 2023-10-27 LAB — HEPATITIS B SURFACE ANTIGEN: Hepatitis B Surface Ag: NONREACTIVE

## 2023-10-27 MED ORDER — NITROGLYCERIN 0.4 MG SL SUBL
SUBLINGUAL_TABLET | SUBLINGUAL | Status: AC
  Filled 2023-10-27: qty 1

## 2023-10-27 MED ORDER — CHLORHEXIDINE GLUCONATE CLOTH 2 % EX PADS: 6.0000 | MEDICATED_PAD | Freq: Every day | CUTANEOUS | Status: DC

## 2023-10-27 MED ORDER — HYDROMORPHONE HCL 1 MG/ML IJ SOLN
0.5000 mg | Freq: Once | INTRAMUSCULAR | Status: AC
  Administered 2023-10-27: 0.5 mg via INTRAVENOUS
  Filled 2023-10-27: qty 1

## 2023-10-27 MED ORDER — HYDROMORPHONE HCL 1 MG/ML IJ SOLN
1.0000 mg | Freq: Once | INTRAMUSCULAR | Status: AC
  Administered 2023-10-27: 1 mg via INTRAVENOUS
  Filled 2023-10-27: qty 1

## 2023-10-27 MED ORDER — NITROGLYCERIN 0.4 MG SL SUBL
0.4000 mg | SUBLINGUAL_TABLET | SUBLINGUAL | Status: DC | PRN
  Administered 2023-10-27 (×5): 0.4 mg via SUBLINGUAL
  Filled 2023-10-27 (×2): qty 1

## 2023-10-27 NOTE — Progress Notes (Signed)
 Pt is complaining of vaginal itching and soreness. She states she has had a new sexual partner and would like STD testing done prior to discharge. Pt is also requesting something for itching. Provider on call informed via secure chat.

## 2023-10-27 NOTE — Progress Notes (Signed)
 Patient had sharp chest pain and SOB after I saw her today.  Not improved with SL NTG.  Dr. Isidoro Donning saw the patient and felt it was costochondritis and was worse with palpation of chest wall.  hsTrop minimally elevated with flat trend similar to a few days ago and EKG with no acute ST changes.  Given Dilaudid and Ativan for pain and anxiety.  No further cardiac recs at this time.

## 2023-10-27 NOTE — Care Management Important Message (Signed)
 Important Message  Patient Details  Name: Linda [name] MRN: [id number] Date of Birth: 1995-11-28   Important Message Given:  Yes - Medicare IM     Renie Ora 10/27/2023, 12:26 PM

## 2023-10-27 NOTE — Progress Notes (Signed)
 Received patient in bed to unit.  Alert and oriented.  Informed consent signed and in chart.   TX duration: 3 hours  Patient tolerated well.  Transported back to the room  Alert, without acute distress.  Hand-off given to patient's nurse.   Access used: Left AV fistula Access issues: none  Total UF removed: 3L Medication(s) given: nitroglycerin x2, oxycodone   Stacie Glaze LPN [facility]

## 2023-10-27 NOTE — Progress Notes (Signed)
 [facility] Progress Note  Name: Linda [name] MRN: [id number] DOB: 11-25-1995  Chief Complaint:  Shortness of breath   Subjective:  Discharge had been planned on 2/25 per charting however she stayed overnight.  Per charting overnight had requested STD testing.  Her outpatient clinic for HD is not able to get her in for treatment today if she is discharged today.  Last HD on late 2/23 with 350 mL UF with HD (she states was limited by cramping).  He had 800 mL UOP over 2/26 thus far.  She first states that she misses HD once a week then states not missing.  She asks about her dilaudid and I asked her to please speak with her primary team about any pain medication questions.  She states that she pulls up to 3.5 kg if she has fluid on.  She states she was told they took off 3.5 kg UF this past treatment.  She goes back and forth but thinks we need to pull off more fluid.   Review of systems:    She reports shortness of breath  She has been ambulatory in Linda room  Hx some nausea no vomiting cramping  --------------  Background on consult:  HPI: Linda [name] is a 28 y.o. year-old w/ PMH as below who presented to ED c/o SOB. Missed dialysis Friday d/t transportation issues. Also lower back pain and indigestion. 94% on RA w/ ems. In ED BP 150/116, HR 92, RR 18-24, afeb 98.0 F.  2 L 99%. K 4.6 BUN 65 creat 12.3 Hb 10  Wbc 9K. Pt rec'd tylenol, Coreg 25mg  x 1, ntg paste, azithromycin and IV rocephin. Pt was admitted. We are asked to see for dialysis.   Pt seen in ED. Pt is frustrated that she is having volume issues now when she has been on HD for 4 years and never had this before.  States she is voiding w/ lasix as much as before. Not sure if she could be losing body wt or not. Hasn't seen a rheumatologist for some time, she will have to do that outpatient. Her relative on Linda phone is worried about a family member who had a PE and pulm HTN and maybe Linda [name] has these problems?  Pt  denies any GI issues. No chest pain.  She had echo in Jan 2025 which showed LVEF 55%, basal lateral hypokinesis. Severe LVH, indeterminate diast filling. RV function wnl, nl size. L atrium severely dilated. Mild MR, no notable AV regurg. IVC w/ > 50% resp variability c/w RA pressure of 3 mm hg.      Intake/Output Summary (Last 24 hours) at 10/27/2023 1025 Last data filed at 10/27/2023 0800 Gross per 24 hour  Intake 600 ml  Output 800 ml  Net -200 ml    Vitals:  Vitals:   10/27/23 0601 10/27/23 0807 10/27/23 0900 10/27/23 0907  BP:  (!) 144/100 (!) 166/124 (!) 168/128  Pulse: 87 90    Resp: 20 18    Temp: 98.5 F (36.9 C) 98.3 F (36.8 C)    TempSrc: Oral Oral    SpO2: 95% 96%    Weight: 75.2 kg     Height:         Physical Exam:    General adult female in bed in no acute distress HEENT normocephalic atraumatic extraocular movements intact sclera anicteric Neck supple trachea midline Lungs clear to auscultation bilaterally normal work of breathing at rest on 1 liter Heart S1S2 no rub  Abdomen soft nontender nondistended Extremities trace edema  Psych normal mood and affect Neuro - alert and oriented x3 provides hx and follows commands Access LUE AV graft with bruit and thrill   Medications reviewed   Labs:     Latest Ref Rng & Units 10/27/2023    5:50 AM 10/26/2023   11:28 AM 10/25/2023    4:31 AM  BMP  Glucose 70 - 99 mg/dL 99  76  82   BUN 6 - 20 mg/dL 51  41  24   Creatinine 0.44 - 1.00 mg/dL 16.10  9.60  4.54   Sodium 135 - 145 mmol/L 134  130  136   Potassium 3.5 - 5.1 mmol/L 4.0  4.1  3.7   Chloride 98 - 111 mmol/L 96  96  96   CO2 22 - 32 mmol/L 24  24  26    Calcium 8.9 - 10.3 mg/dL 9.5  9.0  9.6    Outpatient HD orders:  [facility] MWF jan 2025 --> 3.5h   350/500   73.9kg   2/2 bath  LUA AVF   Heparin none      Assessment/Plan:   SOB/ acute hypoxic resp failure/ volume - optimize UF with HD today ESRD -  HD per MWF schedule.  HD here today.  Goal UF 2.5  - 3kg as tolerated  continue home regimen for lasix   HTN - uncontrolled. Optimize volume with HD. Cardiology has changed her to irbesartan  Anemia of esrd - Hb 10.5, no esa needs at this time.  Secondary hyperparathyroidism - hyperphosphatemia - cont renvela w/ meals Lupus - hasn't seen her rheum MD in a while  Disposition - per primary team. Stable from a strictly renal standpoint.   [name], MD 10/27/2023 10:49 AM

## 2023-10-27 NOTE — Telephone Encounter (Signed)
 Hi can you please help me set this patient up for advanced hypertension clinic.  Dr. Duke Salvia please.

## 2023-10-27 NOTE — Progress Notes (Signed)
 Contacted by nephrologist with request to inquire if pt can treat at out-pt HD clinic 2nd shift today. Contacted [facility] to inquire if 2nd shift appt available. Clinic does not have a 2nd shift available for today. Update provided to nephrologist. Pt will require inpt HD. Will assist as needed.  Olivia Canter Renal Navigator [phone]

## 2023-10-27 NOTE — Progress Notes (Signed)
 [facility]                                                                              Linda [name], is a 28 y.o. female, DOB - 06/09/1996, WUJ:[id number] Admit date - 10/24/2023    Outpatient Primary MD for the patient is Pcp, No  LOS - 3  days  Chief Complaint  Patient presents with   Shortness of Breath       Brief summary   Patient is a 28 year old female with lupus nephritis, ESRD on HD MWF, calciphylaxis, combined CHF, HTN, HLD, tobacco use, recent hospitalization from 2/9-2/11 for acute hypoxic RF in the setting of missed dialysis and pneumonia returning with shortness of breath. Reports missing dialysis on Friday due to transportation issue.  In ED, blood pressure elevated but saturating at 100% on RA.  Labs consistent with ESRD without hyperkalemia or severe azotemia.  CXR raised concern for interstitial pulmonary edema and possible pericardial effusion.  Cardiology and nephrology consulted.  Echocardiogram and VQ scan ordered.    TTE with LVEF of 40 to 45% (55% in 09/2023), GH, severe concentric LVH, trivial pericardial effusion.  Underwent dialysis but not able to remove fluid due to abdominal cramping.  VQ scan negative for PE.    Assessment & Plan     Acute on chronic combined CHF:  -Recurrent hospitalization with CHF exacerbation due to missed dialysis.  Reports missing dialysis due to transportation issue.  -Chest x-ray showed concern for pericardial effusion and acute interstitial pulmonary edema with trace pleural effusion. -2D echo 10/25/23 showed EF of 40 to 45%, global hypokinesis, trivial pericardial effusion. (EF 55% in 09/2023) -  VQ scan negative.  COVID-19, respiratory virus panel, RSV negative.   -Continue home torsemide. -Cardiology, nephrology following, plan for HD today per schedule   ESRD due to lupus nephritis on HD, MWF -Continue hemodialysis per schedule.   Uncontrolled hypertension:  -BP elevated, continue Coreg, amlodipine,  Lasix, irbesartan -Continue HD  Atypical chest pain -Patient complaining of shortness of breath and chest pain today, substernal, worse with palpation, anxious -Troponin 33- 33 flat.  EKG with normal sinus rhythm, no acute ST-T wave changes suggestive of ischemia -Received nitroglycerin sublingual, IV Dilaudid, Ativan for anxiety -Doubt ACS, 2D echo done above, cardiology following.  Anemia of renal disease:  -H&H stable, close to baseline.    Chronic pain/calciphylaxis --seems like she is on oxycodone 15 mg IR.  Per narcotic database, last fill on 08/01/2023 for #150, 30-day supply.  She says she was dismissed by the previous prescriber after she got tramadol prescription from her dentist, and she is in the process of establishing care with another doctor for pain management.  She has been stretching her oxycodone until she see her new doctor.  Has already been started on oxycodone on admission.   Tobacco use disorder: Reports smoking about 3 cigarettes a day.  -Continue nicotine patch   Estimated body mass index is 26.74 kg/m as calculated from the following:   Height as of this encounter: 5\' 6"  (1.676 m).   Weight as of this encounter: 75.2 kg.  Code Status: Full code DVT  Prophylaxis:  heparin injection 5,000 Units Start: 10/24/23 1300   Level of Care: Level of care: Telemetry Cardiac Family Communication: Updated patient Disposition Plan:      Remains inpatient appropriate: Plan to DC tomorrow   Procedures:  Hemodialysis  Consultants:   Cardiology, nephrology  Antimicrobials:   Anti-infectives (From admission, onward)    None          Medications  amLODipine  10 mg Oral Daily   carvedilol  25 mg Oral BID WC   Chlorhexidine Gluconate Cloth  6 each Topical Q0600   furosemide  80 mg Oral TID   heparin  5,000 Units Subcutaneous Q12H   irbesartan  300 mg Oral Daily   nicotine  7 mg Transdermal Daily   sevelamer carbonate  2.4 g Oral Q lunch   sodium chloride  flush  3 mL Intravenous Q12H   sodium chloride flush  3 mL Intravenous Q12H      Subjective:   Linda [name] was seen and examined today.  Tearful, no shortness of breath and chest pain.  Anxious.  Patient denies dizziness, abdominal pain, N/V/D/C, new weakness, numbess, tingling. No acute events overnight.    Objective:   Vitals:   10/27/23 0601 10/27/23 0807 10/27/23 0900 10/27/23 0907  BP:  (!) 144/100 (!) 166/124 (!) 168/128  Pulse: 87 90    Resp: 20 18    Temp: 98.5 F (36.9 C) 98.3 F (36.8 C)    TempSrc: Oral Oral    SpO2: 95% 96%    Weight: 75.2 kg     Height:        Intake/Output Summary (Last 24 hours) at 10/27/2023 1349 Last data filed at 10/27/2023 1300 Gross per 24 hour  Intake 1440 ml  Output 1550 ml  Net -110 ml     Wt Readings from Last 3 Encounters:  10/27/23 75.2 kg  10/12/23 68.9 kg  09/23/23 68.9 kg     Exam General: Alert and oriented x 3, NAD Cardiovascular: S1 S2 auscultated,  RRR Respiratory: Clear to auscultation bilaterally, no wheezing, + substernal TTP Gastrointestinal: Soft, nontender, nondistended, + bowel sounds Ext: trace edema bilaterally Neuro: no new FND's Psych: anxious, tearful    Data Reviewed:  I have personally reviewed following labs    CBC Lab Results  Component Value Date   WBC 7.8 10/27/2023   RBC 3.57 (L) 10/27/2023   HGB 10.7 (L) 10/27/2023   HCT 34.3 (L) 10/27/2023   MCV 96.1 10/27/2023   MCH 30.0 10/27/2023   PLT 241 10/27/2023   MCHC 31.2 10/27/2023   RDW 15.9 (H) 10/27/2023   LYMPHSABS 1.7 10/10/2023   MONOABS 1.3 (H) 10/10/2023   EOSABS 0.2 10/10/2023   BASOSABS 0.1 10/10/2023     Last metabolic panel Lab Results  Component Value Date   NA 134 (L) 10/27/2023   K 4.0 10/27/2023   CL 96 (L) 10/27/2023   CO2 24 10/27/2023   BUN 51 (H) 10/27/2023   CREATININE 10.88 (H) 10/27/2023   GLUCOSE 99 10/27/2023   GFRNONAA 4 (L) 10/27/2023   GFRAA 19 (L) 03/27/2019   CALCIUM 9.5 10/27/2023    PHOS 8.8 (H) 10/27/2023   PROT 7.3 10/25/2023   ALBUMIN 2.8 (L) 10/27/2023   BILITOT 0.7 10/25/2023   ALKPHOS 64 10/25/2023   AST 13 (L) 10/25/2023   ALT 8 10/25/2023   ANIONGAP 14 10/27/2023    CBG (last 3)  No results for input(s): "GLUCAP" in the last 72 hours.  Coagulation Profile: No results for input(s): "INR", "PROTIME" in the last 168 hours.   Radiology Studies: I have personally reviewed the imaging studies  No results found.     Thad Ranger M.D. [facility] 10/27/2023, 1:49 PM  Available via Epic secure chat 7am-7pm After 7 pm, please refer to night coverage provider listed on amion.

## 2023-10-27 NOTE — Progress Notes (Signed)
 Rounding Note    Patient Name: Linda [name] Date of Encounter: 10/27/2023  [facility] Cardiologist: Yvonne Kendall, MD   Subjective   Denies any chest pain.  Did get short of breath last night requiring 1 L of oxygen.  Currently O2 saturations 95% on room air.  Inpatient Medications    Scheduled Meds:  amLODipine  10 mg Oral Daily   carvedilol  25 mg Oral BID WC   furosemide  80 mg Oral TID   heparin  5,000 Units Subcutaneous Q12H   irbesartan  300 mg Oral Daily   nicotine  7 mg Transdermal Daily   sevelamer carbonate  2.4 g Oral Q lunch   sodium chloride flush  3 mL Intravenous Q12H   sodium chloride flush  3 mL Intravenous Q12H   trimethobenzamide  200 mg Intramuscular Once in dialysis   Continuous Infusions:   PRN Meds: hydrALAZINE, ondansetron (ZOFRAN) IV, oxyCODONE, pantoprazole, sodium chloride flush   Vital Signs    Vitals:   10/26/23 1428 10/26/23 1700 10/26/23 1920 10/27/23 0601  BP: (!) 128/99 (!) 138/105 (!) 145/105   Pulse: 76  77 87  Resp: 18  20 20   Temp: 98 F (36.7 C)  98 F (36.7 C) 98.5 F (36.9 C)  TempSrc: Oral  Oral Oral  SpO2: 94%  96% 95%  Weight:    75.2 kg  Height:        Intake/Output Summary (Last 24 hours) at 10/27/2023 0729 Last data filed at 10/26/2023 2040 Gross per 24 hour  Intake 840 ml  Output --  Net 840 ml      10/27/2023    6:01 AM 10/26/2023    6:36 AM 10/24/2023    7:39 AM  Last 3 Weights  Weight (lbs) 165 lb 11.2 oz 164 lb 9.6 oz 170 lb  Weight (kg) 75.161 kg 74.662 kg 77.111 kg      Telemetry    Normal sinus rhythm- Personally Reviewed  ECG    No new EKG to review - Personally Reviewed  Physical Exam   GEN: Well nourished, well developed in no acute distress HEENT: Normal NECK: No JVD; No carotid bruits LYMPHATICS: No lymphadenopathy CARDIAC:RRR, no murmurs, rubs, gallops RESPIRATORY:  Clear to auscultation without rales, wheezing or rhonchi  ABDOMEN: Soft, non-tender,  non-distended MUSCULOSKELETAL:  No edema; No deformity  SKIN: Warm and dry NEUROLOGIC:  Alert and oriented x 3 PSYCHIATRIC:  Normal affect  Labs    High Sensitivity Troponin:   Recent Labs  Lab 10/24/23 1002 10/24/23 1346  TROPONINIHS 51* 45*     Chemistry Recent Labs  Lab 10/24/23 1346 10/25/23 0431 10/26/23 1128 10/27/23 0550  NA  --  136 130* 134*  K  --  3.7 4.1 4.0  CL  --  96* 96* 96*  CO2  --  26 24 24   GLUCOSE  --  82 76 99  BUN  --  24* 41* 51*  CREATININE 12.90* 6.34* 9.16* 10.88*  CALCIUM  --  9.6 9.0 9.5  MG  --  1.9  --   --   PROT 7.3 7.3  --   --   ALBUMIN 2.9* 2.9*  --  2.8*  AST 14* 13*  --   --   ALT 8 8  --   --   ALKPHOS 63 64  --   --   BILITOT 0.9 0.7  --   --   GFRNONAA 4* 8* 6* 4*  ANIONGAP  --  14 10 14     Lipids No results for input(s): "CHOL", "TRIG", "HDL", "LABVLDL", "LDLCALC", "CHOLHDL" in the last 168 hours.  Hematology Recent Labs  Lab 10/24/23 1346 10/25/23 0431 10/27/23 0550  WBC 10.4 7.4 7.8  RBC 3.06* 3.57* 3.57*  HGB 9.2* 10.5* 10.7*  HCT 29.7* 33.4* 34.3*  MCV 97.1 93.6 96.1  MCH 30.1 29.4 30.0  MCHC 31.0 31.4 31.2  RDW 16.9* 16.1* 15.9*  PLT 227 237 241   Thyroid  Recent Labs  Lab 10/24/23 1346  TSH 1.081  FREET4 0.79    BNPNo results for input(s): "BNP", "PROBNP" in the last 168 hours.  DDimer No results for input(s): "DDIMER" in the last 168 hours.   Radiology    VAS [place] LOWER EXTREMITY VENOUS (DVT) Result Date: 10/25/2023  Lower Venous DVT Study Patient Name:  Linda [name]  Date of Exam:   10/25/2023 Medical Rec #: [id number]          Accession #:    [id number] Date of Birth: 07/06/1996          Patient Gender: F Patient Age:   28 years Exam Location:  [facility] Procedure:      VAS [place] LOWER EXTREMITY VENOUS (DVT) Referring Phys: EKTA PATEL --------------------------------------------------------------------------------  Indications: SOB, and CHF.  Comparison Study: Previous study on the right  lower extremity on 2.13.2018. Performing Technologist: Fernande Bras  Examination Guidelines: A complete evaluation includes B-mode imaging, spectral Doppler, color Doppler, and power Doppler as needed of all accessible portions of each vessel. Bilateral testing is considered an integral part of a complete examination. Limited examinations for reoccurring indications may be performed as noted. The reflux portion of the exam is performed with the patient in reverse Trendelenburg.  +---------+---------------+---------+-----------+----------+--------------+ RIGHT    CompressibilityPhasicitySpontaneityPropertiesThrombus Aging +---------+---------------+---------+-----------+----------+--------------+ CFV      Full           Yes      Yes                                 +---------+---------------+---------+-----------+----------+--------------+ SFJ      Full           Yes      Yes                                 +---------+---------------+---------+-----------+----------+--------------+ FV Prox  Full                                                        +---------+---------------+---------+-----------+----------+--------------+ FV Mid   Full                                                        +---------+---------------+---------+-----------+----------+--------------+ FV DistalFull                                                        +---------+---------------+---------+-----------+----------+--------------+  PFV      Full                                                        +---------+---------------+---------+-----------+----------+--------------+ POP      Full           Yes      Yes                                 +---------+---------------+---------+-----------+----------+--------------+ PTV      Full                                                        +---------+---------------+---------+-----------+----------+--------------+ PERO      Full                                                        +---------+---------------+---------+-----------+----------+--------------+   +---------+---------------+---------+-----------+----------+--------------+ LEFT     CompressibilityPhasicitySpontaneityPropertiesThrombus Aging +---------+---------------+---------+-----------+----------+--------------+ CFV      Full           Yes      Yes                                 +---------+---------------+---------+-----------+----------+--------------+ SFJ      Full           Yes      Yes                                 +---------+---------------+---------+-----------+----------+--------------+ FV Prox  Full                                                        +---------+---------------+---------+-----------+----------+--------------+ FV Mid   Full                                                        +---------+---------------+---------+-----------+----------+--------------+ FV DistalFull                                                        +---------+---------------+---------+-----------+----------+--------------+ PFV      Full                                                        +---------+---------------+---------+-----------+----------+--------------+  POP      Full           Yes      Yes                                 +---------+---------------+---------+-----------+----------+--------------+ PTV      Full                                                        +---------+---------------+---------+-----------+----------+--------------+ PERO     Full                                                        +---------+---------------+---------+-----------+----------+--------------+     Summary: BILATERAL: - No evidence of deep vein thrombosis seen in the lower extremities, bilaterally. -No evidence of popliteal cyst, bilaterally.   *See table(s) above for measurements and observations.  Electronically signed by Gerarda Fraction on 10/25/2023 at 3:41:03 PM.    Final    NM Pulmonary Perfusion Result Date: 10/25/2023 CLINICAL DATA:  Shortness of breath. Concern for pulmonary embolism. EXAM: NUCLEAR MEDICINE PERFUSION LUNG SCAN TECHNIQUE: Perfusion images were obtained in multiple projections after intravenous injection of radiopharmaceutical. Ventilation scans intentionally deferred if perfusion scan and chest x-ray adequate for interpretation during COVID 19 epidemic. RADIOPHARMACEUTICALS:  4.1 mCi Tc-26m MAA IV COMPARISON:  Chest radiograph dated 10/24/2023. FINDINGS: There is uniform perfusion of the lungs.  No perfusion defect noted. IMPRESSION: Normal perfusion scan. Electronically Signed   By: Elgie Collard M.D.   On: 10/25/2023 12:13   ECHOCARDIOGRAM LIMITED Result Date: 10/25/2023    ECHOCARDIOGRAM LIMITED REPORT   Patient Name:   Linda [name] Date of Exam: 10/25/2023 Medical Rec #:  [id number]         Height:       66.0 in Accession #:    [id number]        Weight:       170.0 lb Date of Birth:  December 18, 1995         BSA:          1.866 m Patient Age:    27 years          BP:           165/119 mmHg Patient Gender: F                 HR:           75 bpm. Exam Location:  Inpatient Procedure: Limited Echo (Both Spectral and Color Flow Doppler were utilized            during procedure). Indications:    I31.3 Pericardial effusion  History:        Patient has prior history of Echocardiogram examinations, most                 recent 09/12/2023. CHF, Lupus; Risk Factors:Hypertension.  Sonographer:    Irving Burton Senior RDCS Referring Phys: [id number] Dolphus Jenny  Sonographer Comments: Limited effusion check IMPRESSIONS  1. Left ventricular ejection fraction, by estimation, is 40 to 45%. Left ventricular ejection fraction by 2D MOD  biplane is 43.7 %. The left ventricle has mildly decreased function. The left ventricle demonstrates global hypokinesis. There is severe concentric left ventricular  hypertrophy.  2. Right ventricular systolic function is normal. The right ventricular size is normal.  3. Left atrial size was mildly dilated.  4. The aortic valve is tricuspid.  5. The inferior vena cava is normal in size with greater than 50% respiratory variability, suggesting right atrial pressure of 3 mmHg.  6. Trivial effusion is unchanged from prior. \\ FINDINGS  Left Ventricle: Left ventricular ejection fraction, by estimation, is 40 to 45%. Left ventricular ejection fraction by 2D MOD biplane is 43.7 %. The left ventricle has mildly decreased function. The left ventricle demonstrates global hypokinesis. There is severe concentric left ventricular hypertrophy. Right Ventricle: The right ventricular size is normal. Right ventricular systolic function is normal. Left Atrium: Left atrial size was mildly dilated. Right Atrium: Right atrial size was normal in size. Pericardium: Trivial effusion is unchanged from prior. Trivial pericardial effusion is present. Aortic Valve: The aortic valve is tricuspid. Venous: The inferior vena cava is normal in size with greater than 50% respiratory variability, suggesting right atrial pressure of 3 mmHg. LEFT VENTRICLE PLAX 2D                        Biplane EF (MOD) LV PW:         1.90 cm         LV Biplane EF:   Left LV IVS:        1.80 cm                          ventricular                                                 ejection                                                 fraction by LV Volumes (MOD)                                2D MOD LV vol d, MOD    149.0 ml                       biplane is A2C:                                            43.7 %. LV vol d, MOD    141.0 ml A4C: LV vol s, MOD    79.8 ml A2C: LV vol s, MOD    83.0 ml A4C: LV SV MOD A2C:   69.2 ml LV SV MOD A4C:   141.0 ml LV SV MOD BP:    64.3 ml LEFT ATRIUM         Index LA diam:    5.30 cm 2.84 cm/m Dalton McleanMD Electronically signed by Wilfred Lacy Signature Date/Time: 10/25/2023/9:15:53 AM  Final     Cardiac Studies   Echo 09/12/23: 1. Basal lateral hypokinesis. . Left ventricular ejection fraction, by  estimation, is 55%. The left ventricle has normal function. There is  severe concentric left ventricular hypertrophy. Indeterminate diastolic  filling due to E-A fusion.   2. Right ventricular systolic function is normal. The right ventricular  size is normal.   3. Left atrial size was severely dilated.   4. Minimally restricted motion of MV.. Mild mitral valve regurgitation.   5. The aortic valve is tricuspid. Aortic valve regurgitation is not  visualized.   6. The inferior vena cava is normal in size with greater than 50%  respiratory variability, suggesting right atrial pressure of 3 mmHg.   Patient Profile     28 y.o. female with a hx of ESRD on HD (M/W/F), lupus nephritis, chronic diastolic heart failure, hypertension, anemia of chronic disease who is being seen for the evaluation of shortness of breath with pericardial effusion   Assessment & Plan    Shortness of breath Elevated Troponin Acute Hypoxemic Respiratory Failure Acute on chronic HFrEF - after missing HD on Friday (MWF) - VQ scan with no PE - viral panel negative -Troponin minimally elevated with flat trend.  This is not consistent with ACS and likely represents demand ischemia in the setting of acute on chronic HFrEF and poorly controlled hypertension in the setting of end-stage renal disease  - 2D echo with EF 40-45% with GHK and severe LVH>>EF decreased from 09/2023 at 55% but was 45-50% in 2023>>suspect that this is related to hypertensive cardiomyopathy as BP is poorly controlled.  Nuclear stress test 2020 in setting of EF 40% showed no ischemia>> no further ischemic workup recommended at this time - Trivial pericardial effusion unchanged from prior echo - volume management per nephrology along with Lasix - no SGLT2i given ESRD - needs aggressive control of BP - Continue carvedilol 25 mg twice  daily, amlodipine 10 mg daily, irbesartan 300 mg daily - Volume management per renal  ESRD Pericardial effusion - trivial on echo this admission and unchanged form echo 09/2023 - effusion likely uremic - HD per nephrology  Anemia of chronic disease - Hb stable at 10.5  HTN -He remains but improved after changing to irbesartan -Continue Anlodipine 10 mg daily, carvedilol 25 mg twice daily, irbesartan 300 mg daily -Avoid hydralazine given her lupus -Consider addition of clonidine -No further recommendations at this time would continue to manage BP with volume removal -Will get into advanced hypertension clinic outpatient  I spent 25 minutes caring for this patient today face to face, ordering and reviewing labs, reviewing records from 2D echo  seeing the patient, documenting in the record, and discussing medicine changes with nephrology  [facility] will sign off.   Medication Recommendations: Amlodipine 10 mg daily, carvedilol 25 mg twice daily, irbesartan 300 mg daily Other recommendations (labs, testing, etc): None Follow up as an outpatient: Advanced hypertension clinic  For questions or updates, please contact [facility] Please consult [email or web address] for contact info under        Signed, Armanda Magic, MD  10/27/2023, 7:29 AM

## 2023-10-27 NOTE — Progress Notes (Addendum)
 CSW received consult for patient. Patient requesting note for Court. CSW spoke with patient. Patient informed CSW she has court today. Patient requested the CSW fax over note to court. Patient provided fax # [phone] for [facility]. CSW faxed over note for court for patient. All questions answered. No further questions reported at this time.

## 2023-10-28 ENCOUNTER — Other Ambulatory Visit (HOSPITAL_COMMUNITY): Payer: Self-pay

## 2023-10-28 DIAGNOSIS — R0602 Shortness of breath: Secondary | ICD-10-CM | POA: Diagnosis not present

## 2023-10-28 DIAGNOSIS — I3139 Other pericardial effusion (noninflammatory): Secondary | ICD-10-CM | POA: Diagnosis not present

## 2023-10-28 DIAGNOSIS — N186 End stage renal disease: Secondary | ICD-10-CM | POA: Diagnosis not present

## 2023-10-28 LAB — CBC
HCT: 32.6 % — ABNORMAL LOW (ref 36.0–46.0)
Hemoglobin: 10.3 g/dL — ABNORMAL LOW (ref 12.0–15.0)
MCH: 29.5 pg (ref 26.0–34.0)
MCHC: 31.6 g/dL (ref 30.0–36.0)
MCV: 93.4 fL (ref 80.0–100.0)
Platelets: 222 10*3/uL (ref 150–400)
RBC: 3.49 MIL/uL — ABNORMAL LOW (ref 3.87–5.11)
RDW: 15.9 % — ABNORMAL HIGH (ref 11.5–15.5)
WBC: 9.2 10*3/uL (ref 4.0–10.5)
nRBC: 0 % (ref 0.0–0.2)

## 2023-10-28 LAB — RENAL FUNCTION PANEL
Albumin: 2.9 g/dL — ABNORMAL LOW (ref 3.5–5.0)
Anion gap: 16 — ABNORMAL HIGH (ref 5–15)
BUN: 28 mg/dL — ABNORMAL HIGH (ref 6–20)
CO2: 25 mmol/L (ref 22–32)
Calcium: 9.7 mg/dL (ref 8.9–10.3)
Chloride: 90 mmol/L — ABNORMAL LOW (ref 98–111)
Creatinine, Ser: 6.85 mg/dL — ABNORMAL HIGH (ref 0.44–1.00)
GFR, Estimated: 8 mL/min — ABNORMAL LOW (ref >60)
Glucose, Bld: 91 mg/dL (ref 70–99)
Phosphorus: 6.6 mg/dL — ABNORMAL HIGH (ref 2.5–4.6)
Potassium: 3.7 mmol/L (ref 3.5–5.1)
Sodium: 131 mmol/L — ABNORMAL LOW (ref 135–145)

## 2023-10-28 LAB — HEPATITIS B SURFACE ANTIBODY, QUANTITATIVE: Hep B S AB Quant (Post): 7.6 m[IU]/mL — ABNORMAL LOW

## 2023-10-28 MED ORDER — HYDROXYZINE HCL 10 MG PO TABS
10.0000 mg | ORAL_TABLET | Freq: Three times a day (TID) | ORAL | 0 refills | Status: DC | PRN
Start: 2023-10-28 — End: 2023-11-19

## 2023-10-28 MED ORDER — NITROGLYCERIN 0.4 MG SL SUBL: 0.4000 mg | SUBLINGUAL_TABLET | SUBLINGUAL | 12 refills | Status: AC | PRN

## 2023-10-28 MED ORDER — ONDANSETRON 4 MG PO TBDP: 4.0000 mg | ORAL_TABLET | Freq: Three times a day (TID) | ORAL | 0 refills | Status: DC | PRN

## 2023-10-28 MED ORDER — CAMPHOR-MENTHOL 0.5-0.5 % EX LOTN: TOPICAL_LOTION | CUTANEOUS | 1 refills | Status: DC | PRN

## 2023-10-28 MED ORDER — CAMPHOR-MENTHOL 0.5-0.5 % EX LOTN
TOPICAL_LOTION | CUTANEOUS | Status: DC | PRN
  Filled 2023-10-28: qty 222

## 2023-10-28 MED ORDER — OXYCODONE HCL 15 MG PO TABS: 15.0000 mg | ORAL_TABLET | Freq: Four times a day (QID) | ORAL | 0 refills | Status: DC | PRN

## 2023-10-28 NOTE — Discharge Summary (Signed)
 Physician Discharge Summary   Patient: Linda [name] MRN: [id number] DOB: 26-Mar-1996  Admit date:     10/24/2023  Discharge date: 10/28/23  Discharge Physician: Thad Ranger, MD    PCP: Pcp, No   Recommendations at discharge:   Ambulatory referral sent to dermatology for calciphylaxis Continue hemodialysis Monday Wednesday Friday  Discharge Diagnoses:  Acute on chronic combined CHF  ESRD on hemodialysis MWF SOB (shortness of breath)   Pericardial effusion   Lupus nephritis (HCC) Anemia of renal disease Chronic pain/calciphylaxis   Hospital Course: Patient is a 28 year old female with lupus nephritis, ESRD on HD MWF, calciphylaxis, combined CHF, HTN, HLD, tobacco use, recent hospitalization from 2/9-2/11 for acute hypoxic RF in the setting of missed dialysis and pneumonia returning with shortness of breath. Reports missing dialysis on Friday due to transportation issue.  In ED, blood pressure elevated but saturating at 100% on RA.  Labs consistent with ESRD without hyperkalemia or severe azotemia.  CXR raised concern for interstitial pulmonary edema and possible pericardial effusion.  Cardiology and nephrology consulted.  Echocardiogram and VQ scan ordered.    TTE with LVEF of 40 to 45% (55% in 09/2023), GH, severe concentric LVH, trivial pericardial effusion.  Underwent dialysis but not able to remove fluid due to abdominal cramping.  VQ scan negative for PE.    Assessment and Plan:  Acute on chronic combined CHF:  -Recurrent hospitalization with CHF exacerbation due to missed dialysis.  Reports missing dialysis due to transportation issue.  -Chest x-ray showed concern for pericardial effusion and acute interstitial pulmonary edema with trace pleural effusion. -2D echo 10/25/23 showed EF of 40 to 45%, global hypokinesis, trivial pericardial effusion. (EF 55% in 09/2023) -  VQ scan negative.  COVID-19, respiratory virus panel, RSV negative.   -Continue home torsemide. -Patient was  seen by cardiology, nephrology and continued on hemodialysis per her schedule   ESRD due to lupus nephritis on HD, MWF -Continue hemodialysis per schedule. Next Allises tomorrow on 10/29/2023   Uncontrolled hypertension:  -BP elevated, continue Coreg, amlodipine, Lasix, irbesartan -Continue HD   Atypical chest pain -On 2/26, patient complained of chest pain, shortness of breath, anxiety.   -Troponin 33- 33 flat.  EKG with normal sinus rhythm, no acute ST-T wave changes suggestive of ischemia -Received nitroglycerin sublingual, IV Dilaudid, Ativan for anxiety -Doubt ACS, seen by cardiology, further invasive intervention, need aggressive control of BP.   Anemia of renal disease:  -H&H stable, close to baseline.     Chronic pain/calciphylaxis --seems like she is on oxycodone 15 mg IR.  Per narcotic database, last fill on 08/01/2023 for #150, 30-day supply.  She says she was dismissed by the previous prescriber after she got tramadol prescription from her dentist, and she is in the process of establishing care with another doctor for pain management.  She has been stretching her oxycodone until she see her new doctor.    Tobacco use disorder: Reports smoking about 3 cigarettes a day.  -Continue nicotine patch   Estimated body mass index is 26.74 kg/m as calculated from the following:   Height as of this encounter: 5\' 6"  (1.676 m).   Weight as of this encounter: 75.2 kg.         Pain control - [employer] Controlled Substance Reporting System database was reviewed. and patient was instructed, not to drive, operate heavy machinery, perform activities at heights, swimming or participation in water activities or provide baby-sitting services while on Pain, Sleep and Anxiety Medications; until  their outpatient Physician has advised to do so again. Also recommended to not to take more than prescribed Pain, Sleep and Anxiety Medications.  Consultants: Cardiology, nephrology Procedures  performed: 2D echo, hemodialysis Disposition: Home Diet recommendation:  Discharge Diet Orders (From admission, onward)     Start     Ordered   10/26/23 0000  Diet - low sodium heart healthy        10/26/23 1446            DISCHARGE MEDICATION: Allergies as of 10/28/2023       Reactions   Iodine Hives, Shortness Of Breath   Lisinopril Anaphylaxis   Angioedema   Gabapentin Other (See Comments)   Reaction: Tremor (intolerance); tremor   Ativan [lorazepam] Other (See Comments)   Somnolent with 1mg  [facility]   Hydroxychloroquine Other (See Comments)   Pt reports making her skin peel really bad   Vicodin [hydrocodone-acetaminophen] Itching, Rash        Medication List     STOP taking these medications    losartan 50 MG tablet Commonly known as: COZAAR       TAKE these medications    acetaminophen 325 MG tablet Commonly known as: TYLENOL Take 2 tablets (650 mg total) by mouth every 6 (six) hours as needed for mild pain (pain score 1-3) or moderate pain (pain score 4-6) (or Fever >/= 101).   amLODipine 10 MG tablet Commonly known as: NORVASC Take 10 mg by mouth daily.   calcitRIOL 0.25 MCG capsule Commonly known as: ROCALTROL Take 1 tablet by mouth daily.   camphor-menthol lotion Commonly known as: SARNA Apply topically as needed for itching.   carvedilol 25 MG tablet Commonly known as: COREG Take 1 tablet (25 mg total) by mouth 2 (two) times daily with a meal.   diclofenac Sodium 1 % Gel Commonly known as: Voltaren Apply 2 g topically 4 (four) times daily.   dicyclomine 10 MG capsule Commonly known as: BENTYL Take 1 capsule (10 mg total) by mouth 4 (four) times daily -  before meals and at bedtime for 5 days. Abdominal cramps.   furosemide 80 MG tablet Commonly known as: LASIX Take 80 mg by mouth 3 (three) times daily.   hydrOXYzine 10 MG tablet Commonly known as: ATARAX Take 1 tablet (10 mg total) by mouth 3 (three) times  daily as needed for itching.   ibuprofen 800 MG tablet Commonly known as: ADVIL Take 800 mg by mouth every 8 (eight) hours as needed.   irbesartan 300 MG tablet Commonly known as: AVAPRO Take 1 tablet (300 mg total) by mouth daily.   medroxyPROGESTERone 150 MG/ML injection Commonly known as: DEPO-PROVERA Inject 1 mL (150 mg total) into the muscle every 3 (three) months.   methocarbamol 500 MG tablet Commonly known as: ROBAXIN Take 1 tablet (500 mg total) by mouth every 8 (eight) hours as needed for muscle spasms.   Narcan 4 MG/0.1ML Liqd nasal spray kit Generic drug: naloxone Place 1 spray into the nose once. If needed for emergency overdose.   nitroGLYCERIN 0.4 MG SL tablet Commonly known as: NITROSTAT Place 1 tablet (0.4 mg total) under the tongue every 5 (five) minutes as needed for chest pain.   ondansetron 4 MG disintegrating tablet Commonly known as: ZOFRAN-ODT Take 1 tablet (4 mg total) by mouth every 8 (eight) hours as needed for nausea or vomiting.   oxyCODONE 15 MG immediate release tablet Commonly known as: ROXICODONE Take 1 tablet (15  mg total) by mouth every 6 (six) hours as needed for pain. What changed: when to take this   pantoprazole 40 MG tablet Commonly known as: PROTONIX Take 1 tablet (40 mg total) by mouth daily as needed (for acid reflux).   Renvela 2.4 g Pack Generic drug: sevelamer carbonate Take 2.4 g by mouth See admin instructions. Take with a large meal once a day to once every other day.   Safety Seal Miscellaneous Misc Use on the alternate nights when you are not applying tretinoin (Tuesday, Thursday, Saturday, and Sunday)   sodium bicarbonate 650 MG tablet Take 1 tablet (650 mg total) by mouth 2 (two) times daily.   tiZANidine 4 MG tablet Commonly known as: ZANAFLEX Take 4 mg by mouth 3 (three) times daily.   tretinoin 0.025 % cream Commonly known as: RETIN-A Apply 1 application  topically at bedtime.   Vitamin D (Ergocalciferol)  1.25 MG (50000 UNIT) Caps capsule Commonly known as: DRISDOL ergocalciferol (vitamin D2) 1,250 mcg (50,000 unit) capsule TAKE 1 CAPSULE BY MOUTH WEEKLY        Discharge Exam: Filed Weights   10/27/23 1428 10/27/23 1748 10/28/23 0505  Weight: 82.1 kg 79.1 kg 73.3 kg   S: Doing well, no complaints of any chest pain or shortness of breath, wants to go home today, has an appointment at 1130am that she needed to keep.  BP (!) 164/117 (BP Location: Right Wrist)   Pulse 87   Temp 97.8 F (36.6 C) (Oral)   Resp 18   Ht 5\' 6"  (1.676 m)   Wt 73.3 kg   SpO2 99%   BMI 26.08 kg/m   Physical Exam General: Alert and oriented x 3, NAD Cardiovascular: S1 S2 clear, RRR.  Respiratory: CTAB, no wheezing Gastrointestinal: Soft, nontender, nondistended, NBS Ext: no pedal edema bilaterally Neuro: no new deficits Psych: Normal affect    Condition at discharge: fair  The results of significant diagnostics from this hospitalization (including imaging, microbiology, ancillary and laboratory) are listed below for reference.   Imaging Studies: VAS [place] LOWER EXTREMITY VENOUS (DVT) Result Date: 10/25/2023  Lower Venous DVT Study Patient Name:  HAYDEN MABIN  Date of Exam:   10/25/2023 Medical Rec #: [id number]          Accession #:    [id number] Date of Birth: June 03, 1996          Patient Gender: F Patient Age:   28 years Exam Location:  [facility] Procedure:      VAS [place] LOWER EXTREMITY VENOUS (DVT) Referring Phys: EKTA PATEL --------------------------------------------------------------------------------  Indications: SOB, and CHF.  Comparison Study: Previous study on the right lower extremity on 2.13.2018. Performing Technologist: Fernande Bras  Examination Guidelines: A complete evaluation includes B-mode imaging, spectral Doppler, color Doppler, and power Doppler as needed of all accessible portions of each vessel. Bilateral testing is considered an integral part of a complete  examination. Limited examinations for reoccurring indications may be performed as noted. The reflux portion of the exam is performed with the patient in reverse Trendelenburg.  +---------+---------------+---------+-----------+----------+--------------+ RIGHT    CompressibilityPhasicitySpontaneityPropertiesThrombus Aging +---------+---------------+---------+-----------+----------+--------------+ CFV      Full           Yes      Yes                                 +---------+---------------+---------+-----------+----------+--------------+ SFJ      Full  Yes      Yes                                 +---------+---------------+---------+-----------+----------+--------------+ FV Prox  Full                                                        +---------+---------------+---------+-----------+----------+--------------+ FV Mid   Full                                                        +---------+---------------+---------+-----------+----------+--------------+ FV DistalFull                                                        +---------+---------------+---------+-----------+----------+--------------+ PFV      Full                                                        +---------+---------------+---------+-----------+----------+--------------+ POP      Full           Yes      Yes                                 +---------+---------------+---------+-----------+----------+--------------+ PTV      Full                                                        +---------+---------------+---------+-----------+----------+--------------+ PERO     Full                                                        +---------+---------------+---------+-----------+----------+--------------+   +---------+---------------+---------+-----------+----------+--------------+ LEFT     CompressibilityPhasicitySpontaneityPropertiesThrombus Aging  +---------+---------------+---------+-----------+----------+--------------+ CFV      Full           Yes      Yes                                 +---------+---------------+---------+-----------+----------+--------------+ SFJ      Full           Yes      Yes                                 +---------+---------------+---------+-----------+----------+--------------+ FV Prox  Full                                                        +---------+---------------+---------+-----------+----------+--------------+  FV Mid   Full                                                        +---------+---------------+---------+-----------+----------+--------------+ FV DistalFull                                                        +---------+---------------+---------+-----------+----------+--------------+ PFV      Full                                                        +---------+---------------+---------+-----------+----------+--------------+ POP      Full           Yes      Yes                                 +---------+---------------+---------+-----------+----------+--------------+ PTV      Full                                                        +---------+---------------+---------+-----------+----------+--------------+ PERO     Full                                                        +---------+---------------+---------+-----------+----------+--------------+     Summary: BILATERAL: - No evidence of deep vein thrombosis seen in the lower extremities, bilaterally. -No evidence of popliteal cyst, bilaterally.   *See table(s) above for measurements and observations. Electronically signed by Gerarda Fraction on 10/25/2023 at 3:41:03 PM.    Final    NM Pulmonary Perfusion Result Date: 10/25/2023 CLINICAL DATA:  Shortness of breath. Concern for pulmonary embolism. EXAM: NUCLEAR MEDICINE PERFUSION LUNG SCAN TECHNIQUE: Perfusion images were obtained in multiple  projections after intravenous injection of radiopharmaceutical. Ventilation scans intentionally deferred if perfusion scan and chest x-ray adequate for interpretation during COVID 19 epidemic. RADIOPHARMACEUTICALS:  4.1 mCi Tc-41m MAA IV COMPARISON:  Chest radiograph dated 10/24/2023. FINDINGS: There is uniform perfusion of the lungs.  No perfusion defect noted. IMPRESSION: Normal perfusion scan. Electronically Signed   By: Elgie Collard M.D.   On: 10/25/2023 12:13   ECHOCARDIOGRAM LIMITED Result Date: 10/25/2023    ECHOCARDIOGRAM LIMITED REPORT   Patient Name:   JUNO ALERS Date of Exam: 10/25/2023 Medical Rec #:  [id number]         Height:       66.0 in Accession #:    [id number]        Weight:       170.0 lb Date of Birth:  01/02/1996         BSA:  1.866 m Patient Age:    27 years          BP:           165/119 mmHg Patient Gender: F                 HR:           75 bpm. Exam Location:  Inpatient Procedure: Limited Echo (Both Spectral and Color Flow Doppler were utilized            during procedure). Indications:    I31.3 Pericardial effusion  History:        Patient has prior history of Echocardiogram examinations, most                 recent 09/12/2023. CHF, Lupus; Risk Factors:Hypertension.  Sonographer:    Irving Burton Senior RDCS Referring Phys: [id number] Dolphus Jenny  Sonographer Comments: Limited effusion check IMPRESSIONS  1. Left ventricular ejection fraction, by estimation, is 40 to 45%. Left ventricular ejection fraction by 2D MOD biplane is 43.7 %. The left ventricle has mildly decreased function. The left ventricle demonstrates global hypokinesis. There is severe concentric left ventricular hypertrophy.  2. Right ventricular systolic function is normal. The right ventricular size is normal.  3. Left atrial size was mildly dilated.  4. The aortic valve is tricuspid.  5. The inferior vena cava is normal in size with greater than 50% respiratory variability, suggesting right atrial pressure of  3 mmHg.  6. Trivial effusion is unchanged from prior. \\ FINDINGS  Left Ventricle: Left ventricular ejection fraction, by estimation, is 40 to 45%. Left ventricular ejection fraction by 2D MOD biplane is 43.7 %. The left ventricle has mildly decreased function. The left ventricle demonstrates global hypokinesis. There is severe concentric left ventricular hypertrophy. Right Ventricle: The right ventricular size is normal. Right ventricular systolic function is normal. Left Atrium: Left atrial size was mildly dilated. Right Atrium: Right atrial size was normal in size. Pericardium: Trivial effusion is unchanged from prior. Trivial pericardial effusion is present. Aortic Valve: The aortic valve is tricuspid. Venous: The inferior vena cava is normal in size with greater than 50% respiratory variability, suggesting right atrial pressure of 3 mmHg. LEFT VENTRICLE PLAX 2D                        Biplane EF (MOD) LV PW:         1.90 cm         LV Biplane EF:   Left LV IVS:        1.80 cm                          ventricular                                                 ejection                                                 fraction by LV Volumes (MOD)  2D MOD LV vol d, MOD    149.0 ml                       biplane is A2C:                                            43.7 %. LV vol d, MOD    141.0 ml A4C: LV vol s, MOD    79.8 ml A2C: LV vol s, MOD    83.0 ml A4C: LV SV MOD A2C:   69.2 ml LV SV MOD A4C:   141.0 ml LV SV MOD BP:    64.3 ml LEFT ATRIUM         Index LA diam:    5.30 cm 2.84 cm/m Dalton McleanMD Electronically signed by Wilfred Lacy Signature Date/Time: 10/25/2023/9:15:53 AM    Final    DG Chest 2 View Result Date: 10/24/2023 CLINICAL DATA:  28 year old female with shortness of breath, missed dialysis. EXAM: CHEST - 2 VIEW COMPARISON:  Portable chest 10/12/2023 and earlier. FINDINGS: Seated upright AP and lateral views at 0839 hours. Pronounced increased cardiac silhouette,  suspicious for pericardial effusion. Superimposed increased indistinct pulmonary vascular congestion. Only trace pleural fluid in the minor fissures. Lung base atelectasis. No consolidation. No pneumothorax. Visualized tracheal air column is within normal limits. No acute osseous abnormality identified. Negative visible bowel gas. IMPRESSION: 1. Acutely increased cardiac silhouette suspicious for Pericardial Effusion. 2. Evidence of acute interstitial pulmonary edema but only trace pleural fluid. Electronically Signed   By: Odessa Fleming M.D.   On: 10/24/2023 09:12   DG CHEST PORT 1 VIEW Result Date: 10/12/2023 CLINICAL DATA:  Shortness of breath EXAM: PORTABLE CHEST 1 VIEW COMPARISON:  10/10/2023 FINDINGS: Cardiac shadow remains enlarged. Previously seen central vascular congestion and pulmonary edema has resolved in the interval from the prior exam. No new focal infiltrate is seen. No bony abnormality is noted. IMPRESSION: Resolution of previously seen pulmonary edema. Electronically Signed   By: Alcide Clever M.D.   On: 10/12/2023 10:06   DG Chest Port 1 View Result Date: 10/10/2023 CLINICAL DATA:  Questionable sepsis. EXAM: PORTABLE CHEST 1 VIEW COMPARISON:  September 11, 2023 FINDINGS: The cardiac silhouette is moderately enlarged and unchanged in size. There is prominence of the pulmonary vasculature with mild, ill-defined areas of airspace disease seen bilaterally. This is most notable throughout the left lung and right lung base. No pleural effusion or pneumothorax is identified. The visualized skeletal structures are unremarkable. IMPRESSION: 1. Cardiomegaly with mild pulmonary vascular congestion. 2. Mild bilateral airspace disease, which may represent mild pulmonary edema versus multifocal pneumonia. Electronically Signed   By: Aram Candela M.D.   On: 10/10/2023 03:32    Microbiology: Results for orders placed or performed during the hospital encounter of 10/24/23  SARS Coronavirus 2 by RT PCR  (hospital order, performed in [facility] hospital lab) *cepheid single result test* Anterior Nasal Swab     Status: None   Collection Time: 10/25/23  9:01 AM   Specimen: Anterior Nasal Swab  Result Value Ref Range Status   SARS Coronavirus 2 by RT PCR NEGATIVE NEGATIVE Final    Comment: Performed at [facility], [address], [place], [place] [address]  Respiratory (~20 pathogens) panel by PCR     Status: None   Collection Time: 10/25/23  9:01 AM   Specimen: Nasopharyngeal Swab; Respiratory  Result Value Ref Range Status   Adenovirus NOT DETECTED NOT DETECTED Final   Coronavirus 229E NOT DETECTED NOT DETECTED Final    Comment: (NOTE) The Coronavirus on the Respiratory Panel, DOES NOT test for the novel  Coronavirus (2019 nCoV)    Coronavirus HKU1 NOT DETECTED NOT DETECTED Final   Coronavirus NL63 NOT DETECTED NOT DETECTED Final   Coronavirus OC43 NOT DETECTED NOT DETECTED Final   Metapneumovirus NOT DETECTED NOT DETECTED Final   Rhinovirus / Enterovirus NOT DETECTED NOT DETECTED Final   Influenza A NOT DETECTED NOT DETECTED Final   Influenza B NOT DETECTED NOT DETECTED Final   Parainfluenza Virus 1 NOT DETECTED NOT DETECTED Final   Parainfluenza Virus 2 NOT DETECTED NOT DETECTED Final   Parainfluenza Virus 3 NOT DETECTED NOT DETECTED Final   Parainfluenza Virus 4 NOT DETECTED NOT DETECTED Final   Respiratory Syncytial Virus NOT DETECTED NOT DETECTED Final   Bordetella pertussis NOT DETECTED NOT DETECTED Final   Bordetella Parapertussis NOT DETECTED NOT DETECTED Final   Chlamydophila pneumoniae NOT DETECTED NOT DETECTED Final   Mycoplasma pneumoniae NOT DETECTED NOT DETECTED Final    Comment: Performed at [facility], [address], [place], [place] [address]    Labs: CBC: Recent Labs  Lab 10/24/23 0813 10/24/23 1346 10/25/23 0431 10/27/23 0550 10/28/23 0738  WBC 9.5 10.4 7.4 7.8 9.2  HGB 10.3* 9.2* 10.5* 10.7* 10.3*  HCT 33.4* 29.7* 33.4* 34.3* 32.6*   MCV 96.8 97.1 93.6 96.1 93.4  PLT 234 227 237 241 222   Basic Metabolic Panel: Recent Labs  Lab 10/24/23 0813 10/24/23 1346 10/25/23 0431 10/26/23 1128 10/27/23 0550 10/28/23 0738  NA 141  --  136 130* 134* 131*  K 4.6  --  3.7 4.1 4.0 3.7  CL 104  --  96* 96* 96* 90*  CO2 17*  --  26 24 24 25   GLUCOSE 82  --  82 76 99 91  BUN 65*  --  24* 41* 51* 28*  CREATININE 12.34* 12.90* 6.34* 9.16* 10.88* 6.85*  CALCIUM 9.3  --  9.6 9.0 9.5 9.7  MG  --   --  1.9  --   --   --   PHOS  --   --  5.0*  --  8.8* 6.6*   Liver Function Tests: Recent Labs  Lab 10/24/23 1346 10/25/23 0431 10/27/23 0550 10/28/23 0738  AST 14* 13*  --   --   ALT 8 8  --   --   ALKPHOS 63 64  --   --   BILITOT 0.9 0.7  --   --   PROT 7.3 7.3  --   --   ALBUMIN 2.9* 2.9* 2.8* 2.9*   CBG: No results for input(s): "GLUCAP" in the last 168 hours.  Discharge time spent: greater than 30 minutes.  Signed: Thad Ranger, MD [facility] 10/28/2023

## 2023-10-28 NOTE — Progress Notes (Signed)
 Pt d/c today. Contacted [facility] to be advised of pt's d/c today and that pt should resume care tomorrow.   Olivia Canter Renal Navigator [phone]

## 2023-10-28 NOTE — Plan of Care (Signed)
  Problem: Education: Goal: Knowledge of General Education information will improve Description: Including pain rating scale, medication(s)/side effects and non-pharmacologic comfort measures Outcome: Progressing   Problem: Health Behavior/Discharge Planning: Goal: Ability to manage health-related needs will improve Outcome: Progressing   Problem: Clinical Measurements: Goal: Ability to maintain clinical measurements within normal limits will improve Outcome: Progressing Goal: Will remain free from infection Outcome: Progressing Goal: Diagnostic test results will improve Outcome: Progressing Goal: Respiratory complications will improve Outcome: Progressing Goal: Cardiovascular complication will be avoided Outcome: Progressing   Problem: Activity: Goal: Risk for activity intolerance will decrease Outcome: Progressing   Problem: Nutrition: Goal: Adequate nutrition will be maintained Outcome: Progressing   Problem: Coping: Goal: Level of anxiety will decrease Outcome: Progressing   Problem: Elimination: Goal: Will not experience complications related to bowel motility Outcome: Progressing Goal: Will not experience complications related to urinary retention Outcome: Progressing   Problem: Pain Managment: Goal: General experience of comfort will improve and/or be controlled Outcome: Progressing   Problem: Skin Integrity: Goal: Risk for impaired skin integrity will decrease Outcome: Progressing   Problem: Education: Goal: Ability to demonstrate management of disease process will improve Outcome: Progressing Goal: Ability to verbalize understanding of medication therapies will improve Outcome: Progressing Goal: Individualized Educational Video(s) Outcome: Progressing   Problem: Activity: Goal: Capacity to carry out activities will improve Outcome: Progressing   Problem: Cardiac: Goal: Ability to achieve and maintain adequate cardiopulmonary perfusion will  improve Outcome: Progressing

## 2023-10-29 ENCOUNTER — Telehealth: Payer: Self-pay | Admitting: Nurse Practitioner

## 2023-10-29 ENCOUNTER — Telehealth: Payer: Self-pay

## 2023-10-29 DIAGNOSIS — J810 Acute pulmonary edema: Secondary | ICD-10-CM

## 2023-10-29 DIAGNOSIS — Z992 Dependence on renal dialysis: Secondary | ICD-10-CM | POA: Diagnosis not present

## 2023-10-29 DIAGNOSIS — M3210 Systemic lupus erythematosus, organ or system involvement unspecified: Secondary | ICD-10-CM | POA: Diagnosis not present

## 2023-10-29 DIAGNOSIS — I3139 Other pericardial effusion (noninflammatory): Secondary | ICD-10-CM

## 2023-10-29 DIAGNOSIS — N186 End stage renal disease: Secondary | ICD-10-CM | POA: Diagnosis not present

## 2023-10-29 NOTE — Telephone Encounter (Signed)
 Transition of care contact from inpatient facility  Date of Discharge: 10/28/2023 Date of Contact:     10/29/2023 Method of contact: Phone  Attempted to contact patient to discuss transition of care from inpatient admission. Patient did not answer the phone. Message was left on the patient's voicemail with call back number [phone].

## 2023-10-30 ENCOUNTER — Telehealth: Payer: Self-pay | Admitting: Nephrology

## 2023-10-30 NOTE — Telephone Encounter (Signed)
 Transition of Care Contact from Inpatient Facility   Date of Discharge: 10/28/23  Date of Contact: 10/30/23 Method of contact: phone Talked to patient   Patient contacted to discuss transition of care form recent hospitaliztion. Patient was admitted to [facility] from 10/24/23 to 10/28/23 with the discharge diagnosis of Acute on chronic combined CHF, uncontrolled hypertension, and atypical CP.    Medication changes were reviewed.  Patient will follow up with is outpatient dialysis center 11/01/22.  Other follow up needs include needs new transportation to dialysis.  Given list of transport companies in the area and plans to call to find a new one.    Virgina Norfolk, PA-C [employer]

## 2023-11-01 ENCOUNTER — Telehealth: Payer: Self-pay | Admitting: *Deleted

## 2023-11-01 ENCOUNTER — Ambulatory Visit (HOSPITAL_BASED_OUTPATIENT_CLINIC_OR_DEPARTMENT_OTHER): Payer: 59 | Admitting: Family

## 2023-11-01 DIAGNOSIS — D631 Anemia in chronic kidney disease: Secondary | ICD-10-CM | POA: Diagnosis not present

## 2023-11-01 DIAGNOSIS — N2581 Secondary hyperparathyroidism of renal origin: Secondary | ICD-10-CM | POA: Diagnosis not present

## 2023-11-01 DIAGNOSIS — N186 End stage renal disease: Secondary | ICD-10-CM | POA: Diagnosis not present

## 2023-11-01 DIAGNOSIS — Z992 Dependence on renal dialysis: Secondary | ICD-10-CM | POA: Diagnosis not present

## 2023-11-01 DIAGNOSIS — D509 Iron deficiency anemia, unspecified: Secondary | ICD-10-CM | POA: Diagnosis not present

## 2023-11-01 NOTE — Progress Notes (Unsigned)
 Complex Care Management Note Care Guide Note  11/01/2023 Name: SANDE PICKERT MRN: [id number] DOB: 1995/12/18   Complex Care Management Outreach Attempts: An unsuccessful telephone outreach was attempted today to offer the patient information about available complex care management services.  Follow Up Plan:  Additional outreach attempts will be made to offer the patient complex care management information and services.   Encounter Outcome:  No Answer  Burman Nieves, CMA, Care Guide [facility]  [facility], [facility] Guide Direct Dial: [phone]  Fax: [phone] Website: [email or web address]

## 2023-11-02 NOTE — Progress Notes (Unsigned)
 Complex Care Management Note Care Guide Note  11/02/2023 Name: Linda [name] MRN: [id number] DOB: 27-Jun-1996   Complex Care Management Outreach Attempts: A second unsuccessful outreach was attempted today to offer the patient with information about available complex care management services.  Follow Up Plan:  Additional outreach attempts will be made to offer the patient complex care management information and services.   Encounter Outcome:  No Answer  Burman Nieves, CMA, Care Guide [facility]  [facility], [facility] Guide Direct Dial: [phone]  Fax: [phone] Website: [email or web address]

## 2023-11-03 DIAGNOSIS — N186 End stage renal disease: Secondary | ICD-10-CM | POA: Diagnosis not present

## 2023-11-03 DIAGNOSIS — N2581 Secondary hyperparathyroidism of renal origin: Secondary | ICD-10-CM | POA: Diagnosis not present

## 2023-11-03 DIAGNOSIS — D509 Iron deficiency anemia, unspecified: Secondary | ICD-10-CM | POA: Diagnosis not present

## 2023-11-03 DIAGNOSIS — Z992 Dependence on renal dialysis: Secondary | ICD-10-CM | POA: Diagnosis not present

## 2023-11-03 DIAGNOSIS — D631 Anemia in chronic kidney disease: Secondary | ICD-10-CM | POA: Diagnosis not present

## 2023-11-03 NOTE — Progress Notes (Signed)
 Complex Care Management Note  Care Guide Note 11/03/2023 Name: MEKHI LASCOLA MRN: [id number] DOB: 10-21-1995  Kallie Locks Osmundson is a 28 y.o. year old female who sees Pcp, No for primary care. I reached out to [employer] by phone today to offer complex care management services.  Ms. Klaas was given information about Complex Care Management services today including:   The Complex Care Management services include support from the care team which includes your Nurse Care Manager, Clinical Social Worker, or Pharmacist.  The Complex Care Management team is here to help remove barriers to the health concerns and goals most important to you. Complex Care Management services are voluntary, and the patient may decline or stop services at any time by request to their care team member.   Complex Care Management Consent Status: Patient agreed to services and verbal consent obtained.   Follow up plan:  Telephone appointment with complex care management team member scheduled for:  11/08/2023  Encounter Outcome:  Patient Scheduled  Burman Nieves, CMA, Care Guide [facility]  [facility], [facility] Guide Direct Dial: [phone]  Fax: [phone] Website: [email or web address]

## 2023-11-04 DIAGNOSIS — M546 Pain in thoracic spine: Secondary | ICD-10-CM | POA: Diagnosis not present

## 2023-11-04 DIAGNOSIS — M545 Low back pain, unspecified: Secondary | ICD-10-CM | POA: Diagnosis not present

## 2023-11-04 DIAGNOSIS — G894 Chronic pain syndrome: Secondary | ICD-10-CM | POA: Diagnosis not present

## 2023-11-04 DIAGNOSIS — Z79891 Long term (current) use of opiate analgesic: Secondary | ICD-10-CM | POA: Diagnosis not present

## 2023-11-04 DIAGNOSIS — M25562 Pain in left knee: Secondary | ICD-10-CM | POA: Diagnosis not present

## 2023-11-08 ENCOUNTER — Ambulatory Visit: Payer: Self-pay | Admitting: *Deleted

## 2023-11-08 DIAGNOSIS — N2581 Secondary hyperparathyroidism of renal origin: Secondary | ICD-10-CM | POA: Diagnosis not present

## 2023-11-08 DIAGNOSIS — D631 Anemia in chronic kidney disease: Secondary | ICD-10-CM | POA: Diagnosis not present

## 2023-11-08 DIAGNOSIS — Z992 Dependence on renal dialysis: Secondary | ICD-10-CM | POA: Diagnosis not present

## 2023-11-08 DIAGNOSIS — N186 End stage renal disease: Secondary | ICD-10-CM | POA: Diagnosis not present

## 2023-11-08 DIAGNOSIS — D509 Iron deficiency anemia, unspecified: Secondary | ICD-10-CM | POA: Diagnosis not present

## 2023-11-08 NOTE — Patient Outreach (Signed)
 Care Coordination   11/08/2023 Name: Linda [name] MRN: [id number] DOB: August 07, 1996   Care Coordination Outreach Attempts:  An unsuccessful outreach was attempted for an appointment today.  Follow Up Plan:  Additional outreach attempts will be made to offer the patient complex care management information and services.   Encounter Outcome:  No Answer   Care Coordination Interventions:  No, not indicated    Rodney Langton, RN, MSN, CCM [place]  [facility], [facility] RN Care Coordinator Direct Dial: [phone] / Main [phone] Fax [phone] Email: [email or web address] Website: [email or web address]

## 2023-11-11 ENCOUNTER — Institutional Professional Consult (permissible substitution) (HOSPITAL_BASED_OUTPATIENT_CLINIC_OR_DEPARTMENT_OTHER): Payer: 59 | Admitting: Family

## 2023-11-11 NOTE — Progress Notes (Deleted)
 Advanced Hypertension Clinic Initial Assessment:    Date:  11/11/2023   ID:  Linda [name], DOB April 20, 1996, MRN [id number]  PCP:  Pcp, No  Cardiologist:  Yvonne Kendall, MD  Nephrologist:  Referring MD: Quintella Reichert, MD   CC: Hypertension  History of Present Illness:    Linda [name] is a 28 y.o. female with a hx of lupus nephritis, ESRD on HD MWF, calciphylaxis, HFmrEF, hypertension, hyperlipidemia, tobacco use here to establish care in the Advanced Hypertension Clinic.   Admitted 2/9 - 10/12/2023 for acute hypoxic respiratory failure in the setting of missed dialysis and pneumonia. Admitted 2/23 - 10/28/2023 presenting with dyspnea after missing dialysis Friday due to transportation issue.  Echo 10/25/2023 LVEF 40 to 45%, global hypokinesis, trivial pericardial effusion.  VQ scan, respiratory panel negative.  Carvedilol, amlodipine, Lasix, irbesartan were continued.  Linda [name] was diagnosed with hypertension ***. It has been {Blank single:19197::"easy","difficult","***"} to control. Blood pressure {Blank single:19197::"not checked routinely at home","checked with arm cuff at home","checked with wrist cuff at home","***"}. Readings have been ***. she reports tobacco use ***. Alcohol use ***. For exercise she ***. she eats {Blank multiple:19196::"***","at home","outside of the home"} and {Blank single:19197::"does","does not"} follow low sodium diet.   Previous antihypertensives:  Secondary Causes of Hypertension  Medications/Herbal: OCP, steroids, stimulants, antidepressants, weight loss medication, immune suppressants, NSAIDs, sympathomimetics, alcohol, caffeine, licorice, ginseng, St. John's wort, chemo  Sleep Apnea Renal artery stenosis Hyperaldosteronism Hyper/hypothyroidism Pheochromocytoma: palpitations, tachycardia, headache, diaphoresis (plasma metanephrines) Cushing's syndrome: Cushingoid facies, central obesity, proximal muscle weakness, and ecchymoses,  adrenal incidentaloma (cortisol) Coarctation of the aorta  Past Medical History:  Diagnosis Date   Abnormal ECG    a. In setting of LVH w/ repolarization abnormalities; b. 03/2019 MV: No ischemia/infarct. EF 55-65%.   Angioedema    ESRD (end stage renal disease) (HCC)    on HD (M,W,F)   GERD (gastroesophageal reflux disease)    Hypertension    Lupus    LVH (left ventricular hypertrophy)    a. 11/2017 Echo: EF 50-55%. triv AI. Mild MR. Mod L and mild R atrial enlargement. Nl RV size/fxn. Small pericardial effusion w/o tampondae; b. 03/2018 Echo: EF 55-60%, restrictive filling pattern. Nl RV size/fxn. Sev LAE. Mild MR, mild to mod TR/PR. Mod PAH. Triv effusion.   Migraines    Mixed connective tissue disease (HCC)    Previous Dialysis patient [facility])    a. Off HD since late 2020.   Septic prepatellar bursitis of right knee 08/19/2017    Past Surgical History:  Procedure Laterality Date   GRAFT APPLICATION Left 03/30/2019   Procedure: BRACHIAL CEPHALIC GRAFT CREATION;  Surgeon: Annice Needy, MD;  Location: [facility] ORS;  Service: Vascular;  Laterality: Left;   I & D EXTREMITY Right 08/06/2017   Procedure: IRRIGATION AND DEBRIDEMENT KNEE;  Surgeon: Roby Lofts, MD;  Location: [facility] OR;  Service: Orthopedics;  Laterality: Right;   IR THROMBECTOMY AV FISTULA W/THROMBOLYSIS/PTA/STENT INC/SHUNT/IMG LT Left 05/18/2022   IR US GUIDE VASC ACCESS LEFT  05/18/2022   MULTIPLE TOOTH EXTRACTIONS      Current Medications: No outpatient medications have been marked as taking for the 11/11/23 encounter (Appointment) with Alver Sorrow, NP.     Allergies:   Iodine, Lisinopril, Gabapentin, Ativan [lorazepam], Hydroxychloroquine, and Vicodin [hydrocodone-acetaminophen]   Social History   Socioeconomic History   Marital status: Single    Spouse name: Not on file   Number of children: Not on file   Years  of education: Not on file   Highest education level: Not on file  Occupational History   Not on  file  Tobacco Use   Smoking status: Every Day    Current packs/day: 0.25    Average packs/day: 0.3 packs/day for 8.7 years (2.2 ttl pk-yrs)    Types: E-cigarettes, Cigarettes    Start date: 02/11/2015   Smokeless tobacco: Never  Vaping Use   Vaping status: Every Day  Substance and Sexual Activity   Alcohol use: No    Alcohol/week: 0.0 standard drinks of alcohol   Drug use: No   Sexual activity: Yes    Partners: Male    Birth control/protection: None  Other Topics Concern   Not on file  Social History Narrative   Lives with boyfriend   Social Drivers of Health   Financial Resource Strain: Low Risk  (09/10/2023)   Received from [facility]   Overall Financial Resource Strain (CARDIA)    Difficulty of Paying Living Expenses: Not hard at all  Food Insecurity: No Food Insecurity (10/24/2023)   Hunger Vital Sign    Worried About Running Out of Food in the Last Year: Never true    Ran Out of Food in the Last Year: Never true  Transportation Needs: No Transportation Needs (10/24/2023)   PRAPARE - Transportation    Lack of Transportation (Medical): No    Lack of Transportation (Non-Medical): No  Physical Activity: Not on file  Stress: Not on file  Social Connections: Unknown (01/12/2022)   [facility]   Social Network    Social Network: Not on file     Family History: The patient's ***family history includes Coronary artery disease (age of onset: 45) in her mother; Diabetes in her maternal grandmother; Heart Problems in her mother; Hypertension in her mother; Kidney disease in her mother; Lupus in her mother.  ROS:   Please see the history of present illness.    *** All other systems reviewed and are negative.  EKGs/Labs/Other Studies Reviewed:         Recent Labs: 10/10/2023: B Natriuretic Peptide 4,431.1 10/24/2023: TSH 1.081 10/25/2023: ALT 8; Magnesium 1.9 10/28/2023: BUN 28; Creatinine, Ser 6.85; Hemoglobin 10.3; Platelets 222; Potassium  3.7; Sodium 131   Recent Lipid Panel No results found for: "CHOL", "TRIG", "HDL", "CHOLHDL", "VLDL", "LDLCALC", "LDLDIRECT"  Physical Exam:   VS:  There were no vitals taken for this visit. , BMI There is no height or weight on file to calculate BMI. GENERAL:  Well appearing HEENT: Pupils equal round and reactive, fundi not visualized, oral mucosa unremarkable NECK:  No jugular venous distention, waveform within normal limits, carotid upstroke brisk and symmetric, no bruits, no thyromegaly LYMPHATICS:  No cervical adenopathy LUNGS:  Clear to auscultation bilaterally HEART:  RRR.  PMI not displaced or sustained,S1 and S2 within normal limits, no S3, no S4, no clicks, no rubs, *** murmurs ABD:  Flat, positive bowel sounds normal in frequency in pitch, no bruits, no rebound, no guarding, no midline pulsatile mass, no hepatomegaly, no splenomegaly EXT:  2 plus pulses throughout, no edema, no cyanosis no clubbing SKIN:  No rashes no nodules NEURO:  Cranial nerves II through XII grossly intact, motor grossly intact throughout PSYCH:  Cognitively intact, oriented to person place and time   ASSESSMENT/PLAN:    HTN -  Avoid hydralazine given lupus HFmrEF -  Tobacco use -  ESRD on HD MWF -   Screening for Secondary Hypertension: { Click here  to document screening for secondary causes of HTN  :[id number]}    Relevant Labs/Studies:    Latest Ref Rng & Units 10/28/2023    7:38 AM 10/27/2023    5:50 AM 10/26/2023   11:28 AM  Basic Labs  Sodium 135 - 145 mmol/L 131  134  130   Potassium 3.5 - 5.1 mmol/L 3.7  4.0  4.1   Creatinine 0.44 - 1.00 mg/dL 7.82  95.62  1.30        Latest Ref Rng & Units 10/24/2023    1:46 PM 01/06/2018    4:49 PM  Thyroid   TSH 0.350 - 4.500 uIU/mL 1.081  0.477                  she *** interested in enrolling in the PREP exercise and nutrition program through the [facility].     Disposition:    FU with MD/APP/PharmD in {gen number 8-65:784696} {Days to  years:10300}    Medication Adjustments/Labs and Tests Ordered: Current medicines are reviewed at length with the patient today.  Concerns regarding medicines are outlined above.  No orders of the defined types were placed in this encounter.  No orders of the defined types were placed in this encounter.    Signed, Alver Sorrow, NP  11/11/2023 1:12 PM    [facility]

## 2023-11-12 ENCOUNTER — Telehealth: Payer: Self-pay | Admitting: *Deleted

## 2023-11-12 NOTE — Progress Notes (Signed)
 Complex Care Management Care Guide Note  11/12/2023 Name: Linda [name] MRN: [id number] DOB: 1996-02-23  Linda [name] is a 28 y.o. year old female who is a primary care patient of Pcp, No and is actively engaged with the care management team. I reached out to Regenia Skeeter by phone today to assist with re-scheduling  with the RN Case Manager.  Follow up plan: Unsuccessful telephone outreach attempt made. A HIPAA compliant phone message was left for the patient providing contact information and requesting a return call.  Burman Nieves, CMA, Care Guide [facility]  [facility], [facility] Guide Direct Dial: [phone]  Fax: [phone] Website: [email or web address]

## 2023-11-14 ENCOUNTER — Emergency Department (HOSPITAL_COMMUNITY)

## 2023-11-14 ENCOUNTER — Other Ambulatory Visit: Payer: Self-pay

## 2023-11-14 ENCOUNTER — Inpatient Hospital Stay (HOSPITAL_COMMUNITY)
Admission: EM | Admit: 2023-11-14 | Discharge: 2023-11-19 | DRG: 640 | Disposition: A | Discharge status: Home / Self Care | Attending: Family Medicine | Admitting: Family Medicine

## 2023-11-14 ENCOUNTER — Encounter (HOSPITAL_COMMUNITY): Payer: Self-pay

## 2023-11-14 DIAGNOSIS — R112 Nausea with vomiting, unspecified: Secondary | ICD-10-CM | POA: Diagnosis present

## 2023-11-14 DIAGNOSIS — N2581 Secondary hyperparathyroidism of renal origin: Secondary | ICD-10-CM | POA: Diagnosis present

## 2023-11-14 DIAGNOSIS — K760 Fatty (change of) liver, not elsewhere classified: Secondary | ICD-10-CM | POA: Diagnosis not present

## 2023-11-14 DIAGNOSIS — G8929 Other chronic pain: Secondary | ICD-10-CM | POA: Diagnosis present

## 2023-11-14 DIAGNOSIS — Z5901 Sheltered homelessness: Secondary | ICD-10-CM

## 2023-11-14 DIAGNOSIS — I517 Cardiomegaly: Secondary | ICD-10-CM | POA: Diagnosis not present

## 2023-11-14 DIAGNOSIS — M3214 Glomerular disease in systemic lupus erythematosus: Secondary | ICD-10-CM

## 2023-11-14 DIAGNOSIS — Z841 Family history of disorders of kidney and ureter: Secondary | ICD-10-CM

## 2023-11-14 DIAGNOSIS — I5032 Chronic diastolic (congestive) heart failure: Secondary | ICD-10-CM | POA: Diagnosis present

## 2023-11-14 DIAGNOSIS — R109 Unspecified abdominal pain: Secondary | ICD-10-CM | POA: Diagnosis present

## 2023-11-14 DIAGNOSIS — R229 Localized swelling, mass and lump, unspecified: Secondary | ICD-10-CM | POA: Diagnosis present

## 2023-11-14 DIAGNOSIS — M3219 Other organ or system involvement in systemic lupus erythematosus: Secondary | ICD-10-CM | POA: Diagnosis present

## 2023-11-14 DIAGNOSIS — D72829 Elevated white blood cell count, unspecified: Secondary | ICD-10-CM | POA: Diagnosis present

## 2023-11-14 DIAGNOSIS — Z885 Allergy status to narcotic agent status: Secondary | ICD-10-CM

## 2023-11-14 DIAGNOSIS — E875 Hyperkalemia: Secondary | ICD-10-CM | POA: Diagnosis present

## 2023-11-14 DIAGNOSIS — Z79899 Other long term (current) drug therapy: Secondary | ICD-10-CM

## 2023-11-14 DIAGNOSIS — I10 Essential (primary) hypertension: Principal | ICD-10-CM

## 2023-11-14 DIAGNOSIS — Z8269 Family history of other diseases of the musculoskeletal system and connective tissue: Secondary | ICD-10-CM

## 2023-11-14 DIAGNOSIS — K828 Other specified diseases of gallbladder: Secondary | ICD-10-CM | POA: Diagnosis not present

## 2023-11-14 DIAGNOSIS — L989 Disorder of the skin and subcutaneous tissue, unspecified: Secondary | ICD-10-CM | POA: Diagnosis present

## 2023-11-14 DIAGNOSIS — R0602 Shortness of breath: Secondary | ICD-10-CM | POA: Diagnosis not present

## 2023-11-14 DIAGNOSIS — Z992 Dependence on renal dialysis: Secondary | ICD-10-CM | POA: Diagnosis not present

## 2023-11-14 DIAGNOSIS — R0600 Dyspnea, unspecified: Secondary | ICD-10-CM | POA: Diagnosis present

## 2023-11-14 DIAGNOSIS — Z538 Procedure and treatment not carried out for other reasons: Secondary | ICD-10-CM | POA: Diagnosis present

## 2023-11-14 DIAGNOSIS — Z833 Family history of diabetes mellitus: Secondary | ICD-10-CM

## 2023-11-14 DIAGNOSIS — Z789 Other specified health status: Secondary | ICD-10-CM

## 2023-11-14 DIAGNOSIS — Z91158 Patient's noncompliance with renal dialysis for other reason: Secondary | ICD-10-CM

## 2023-11-14 DIAGNOSIS — Z793 Long term (current) use of hormonal contraceptives: Secondary | ICD-10-CM

## 2023-11-14 DIAGNOSIS — N186 End stage renal disease: Secondary | ICD-10-CM | POA: Diagnosis present

## 2023-11-14 DIAGNOSIS — I132 Hypertensive heart and chronic kidney disease with heart failure and with stage 5 chronic kidney disease, or end stage renal disease: Secondary | ICD-10-CM | POA: Diagnosis present

## 2023-11-14 DIAGNOSIS — N250 Renal osteodystrophy: Secondary | ICD-10-CM | POA: Diagnosis present

## 2023-11-14 DIAGNOSIS — Z91041 Radiographic dye allergy status: Secondary | ICD-10-CM | POA: Diagnosis not present

## 2023-11-14 DIAGNOSIS — Z5989 Other problems related to housing and economic circumstances: Secondary | ICD-10-CM

## 2023-11-14 DIAGNOSIS — I120 Hypertensive chronic kidney disease with stage 5 chronic kidney disease or end stage renal disease: Secondary | ICD-10-CM | POA: Diagnosis not present

## 2023-11-14 DIAGNOSIS — D631 Anemia in chronic kidney disease: Secondary | ICD-10-CM | POA: Diagnosis present

## 2023-11-14 DIAGNOSIS — R079 Chest pain, unspecified: Secondary | ICD-10-CM | POA: Diagnosis not present

## 2023-11-14 DIAGNOSIS — E8779 Other fluid overload: Secondary | ICD-10-CM | POA: Diagnosis not present

## 2023-11-14 DIAGNOSIS — R1013 Epigastric pain: Secondary | ICD-10-CM | POA: Diagnosis not present

## 2023-11-14 DIAGNOSIS — R6889 Other general symptoms and signs: Secondary | ICD-10-CM | POA: Diagnosis not present

## 2023-11-14 DIAGNOSIS — R160 Hepatomegaly, not elsewhere classified: Secondary | ICD-10-CM | POA: Diagnosis not present

## 2023-11-14 DIAGNOSIS — E8770 Fluid overload, unspecified: Principal | ICD-10-CM | POA: Diagnosis present

## 2023-11-14 DIAGNOSIS — E871 Hypo-osmolality and hyponatremia: Secondary | ICD-10-CM | POA: Diagnosis not present

## 2023-11-14 DIAGNOSIS — Z8249 Family history of ischemic heart disease and other diseases of the circulatory system: Secondary | ICD-10-CM

## 2023-11-14 DIAGNOSIS — Z888 Allergy status to other drugs, medicaments and biological substances status: Secondary | ICD-10-CM

## 2023-11-14 DIAGNOSIS — K219 Gastro-esophageal reflux disease without esophagitis: Secondary | ICD-10-CM | POA: Diagnosis present

## 2023-11-14 DIAGNOSIS — Z87891 Personal history of nicotine dependence: Secondary | ICD-10-CM

## 2023-11-14 DIAGNOSIS — M549 Dorsalgia, unspecified: Secondary | ICD-10-CM | POA: Diagnosis not present

## 2023-11-14 LAB — BASIC METABOLIC PANEL
Anion gap: 21 — ABNORMAL HIGH (ref 5–15)
BUN: 107 mg/dL — ABNORMAL HIGH (ref 6–20)
CO2: 15 mmol/L — ABNORMAL LOW (ref 22–32)
Calcium: 8.8 mg/dL — ABNORMAL LOW (ref 8.9–10.3)
Chloride: 102 mmol/L (ref 98–111)
Creatinine, Ser: 14.9 mg/dL — ABNORMAL HIGH (ref 0.44–1.00)
GFR, Estimated: 3 mL/min — ABNORMAL LOW (ref >60)
Glucose, Bld: 79 mg/dL (ref 70–99)
Potassium: 5.3 mmol/L — ABNORMAL HIGH (ref 3.5–5.1)
Sodium: 138 mmol/L (ref 135–145)

## 2023-11-14 LAB — RESP PANEL BY RT-PCR (RSV, FLU A&B, COVID)  RVPGX2
Influenza A by PCR: NEGATIVE
Influenza B by PCR: NEGATIVE
Resp Syncytial Virus by PCR: NEGATIVE
SARS Coronavirus 2 by RT PCR: NEGATIVE

## 2023-11-14 LAB — CBC
HCT: 29 % — ABNORMAL LOW (ref 36.0–46.0)
Hemoglobin: 9.2 g/dL — ABNORMAL LOW (ref 12.0–15.0)
MCH: 30.8 pg (ref 26.0–34.0)
MCHC: 31.7 g/dL (ref 30.0–36.0)
MCV: 97 fL (ref 80.0–100.0)
Platelets: 174 10*3/uL (ref 150–400)
RBC: 2.99 MIL/uL — ABNORMAL LOW (ref 3.87–5.11)
RDW: 18.6 % — ABNORMAL HIGH (ref 11.5–15.5)
WBC: 8.2 10*3/uL (ref 4.0–10.5)
nRBC: 0 % (ref 0.0–0.2)

## 2023-11-14 LAB — CBC WITH DIFFERENTIAL/PLATELET
Abs Immature Granulocytes: 0.03 10*3/uL (ref 0.00–0.07)
Basophils Absolute: 0.1 10*3/uL (ref 0.0–0.1)
Basophils Relative: 1 %
Eosinophils Absolute: 0.6 10*3/uL — ABNORMAL HIGH (ref 0.0–0.5)
Eosinophils Relative: 6 %
HCT: 29.8 % — ABNORMAL LOW (ref 36.0–46.0)
Hemoglobin: 9.2 g/dL — ABNORMAL LOW (ref 12.0–15.0)
Immature Granulocytes: 0 %
Lymphocytes Relative: 29 %
Lymphs Abs: 3.2 10*3/uL (ref 0.7–4.0)
MCH: 30.4 pg (ref 26.0–34.0)
MCHC: 30.9 g/dL (ref 30.0–36.0)
MCV: 98.3 fL (ref 80.0–100.0)
Monocytes Absolute: 0.8 10*3/uL (ref 0.1–1.0)
Monocytes Relative: 7 %
Neutro Abs: 6.3 10*3/uL (ref 1.7–7.7)
Neutrophils Relative %: 57 %
Platelets: 184 10*3/uL (ref 150–400)
RBC: 3.03 MIL/uL — ABNORMAL LOW (ref 3.87–5.11)
RDW: 18.8 % — ABNORMAL HIGH (ref 11.5–15.5)
WBC: 11.1 10*3/uL — ABNORMAL HIGH (ref 4.0–10.5)
nRBC: 0 % (ref 0.0–0.2)

## 2023-11-14 LAB — RESPIRATORY PANEL BY PCR

## 2023-11-14 LAB — HEPATIC FUNCTION PANEL
ALT: 10 U/L (ref 0–44)
AST: 34 U/L (ref 15–41)
Albumin: 3.1 g/dL — ABNORMAL LOW (ref 3.5–5.0)
Alkaline Phosphatase: 61 U/L (ref 38–126)
Bilirubin, Direct: 0.4 mg/dL — ABNORMAL HIGH (ref 0.0–0.2)
Indirect Bilirubin: 0.7 mg/dL (ref 0.3–0.9)
Total Bilirubin: 1.1 mg/dL (ref 0.0–1.2)
Total Protein: 6.8 g/dL (ref 6.5–8.1)

## 2023-11-14 LAB — LIPASE, BLOOD: Lipase: 29 U/L (ref 11–51)

## 2023-11-14 LAB — HEPATITIS B SURFACE ANTIGEN: Hepatitis B Surface Ag: NONREACTIVE

## 2023-11-14 LAB — TROPONIN I (HIGH SENSITIVITY)
Troponin I (High Sensitivity): 77 ng/L — ABNORMAL HIGH (ref <18)
Troponin I (High Sensitivity): 62 ng/L — ABNORMAL HIGH (ref <18)

## 2023-11-14 LAB — CREATININE, SERUM
Creatinine, Ser: 15.92 mg/dL — ABNORMAL HIGH (ref 0.44–1.00)
GFR, Estimated: 3 mL/min — ABNORMAL LOW (ref >60)

## 2023-11-14 MED ORDER — PANTOPRAZOLE SODIUM 40 MG PO TBEC: 40.0000 mg | DELAYED_RELEASE_TABLET | Freq: Every day | ORAL | Status: DC | PRN

## 2023-11-14 MED ORDER — AMLODIPINE BESYLATE 10 MG PO TABS: 10.0000 mg | ORAL_TABLET | Freq: Every day | ORAL | Status: DC

## 2023-11-14 MED ORDER — ALTEPLASE 2 MG IJ SOLR: 2.0000 mg | Freq: Once | INTRAMUSCULAR | Status: DC | PRN

## 2023-11-14 MED ORDER — OXYCODONE HCL 5 MG PO TABS
15.0000 mg | ORAL_TABLET | Freq: Four times a day (QID) | ORAL | Status: DC | PRN
  Administered 2023-11-14 – 2023-11-15 (×2): 15 mg via ORAL
  Filled 2023-11-14 (×2): qty 3

## 2023-11-14 MED ORDER — ALBUTEROL SULFATE HFA 108 (90 BASE) MCG/ACT IN AERS: 2.0000 | INHALATION_SPRAY | RESPIRATORY_TRACT | Status: DC | PRN

## 2023-11-14 MED ORDER — PENTAFLUOROPROP-TETRAFLUOROETH EX AERO: 1.0000 | INHALATION_SPRAY | CUTANEOUS | Status: DC | PRN

## 2023-11-14 MED ORDER — ANTICOAGULANT SODIUM CITRATE 4% (200MG/5ML) IV SOLN: 5.0000 mL | Status: DC | PRN

## 2023-11-14 MED ORDER — CARVEDILOL 25 MG PO TABS: 25.0000 mg | ORAL_TABLET | Freq: Two times a day (BID) | ORAL | Status: DC

## 2023-11-14 MED ORDER — CARVEDILOL 25 MG PO TABS
25.0000 mg | ORAL_TABLET | Freq: Two times a day (BID) | ORAL | Status: DC
  Administered 2023-11-14 (×2): 25 mg via ORAL
  Filled 2023-11-14: qty 1
  Filled 2023-11-14: qty 2

## 2023-11-14 MED ORDER — IRBESARTAN 300 MG PO TABS
300.0000 mg | ORAL_TABLET | Freq: Every day | ORAL | Status: DC
  Administered 2023-11-14: 300 mg via ORAL
  Filled 2023-11-14: qty 1

## 2023-11-14 MED ORDER — ACETAMINOPHEN 325 MG PO TABS
650.0000 mg | ORAL_TABLET | Freq: Once | ORAL | Status: AC
  Administered 2023-11-14: 650 mg via ORAL
  Filled 2023-11-14: qty 2

## 2023-11-14 MED ORDER — ACETAMINOPHEN 325 MG PO TABS: 650.0000 mg | ORAL_TABLET | Freq: Four times a day (QID) | ORAL | Status: DC | PRN

## 2023-11-14 MED ORDER — IRBESARTAN 300 MG PO TABS: 300.0000 mg | ORAL_TABLET | Freq: Every day | ORAL | Status: DC

## 2023-11-14 MED ORDER — HYDROMORPHONE HCL 1 MG/ML IJ SOLN
1.0000 mg | Freq: Once | INTRAMUSCULAR | Status: AC
  Administered 2023-11-14: 1 mg via INTRAVENOUS
  Filled 2023-11-14: qty 1

## 2023-11-14 MED ORDER — ACETAMINOPHEN 500 MG PO TABS
1000.0000 mg | ORAL_TABLET | Freq: Four times a day (QID) | ORAL | Status: DC | PRN
  Filled 2023-11-14: qty 2

## 2023-11-14 MED ORDER — LIDOCAINE HCL (PF) 1 % IJ SOLN: 5.0000 mL | INTRAMUSCULAR | Status: DC | PRN

## 2023-11-14 MED ORDER — HEPARIN SODIUM (PORCINE) 5000 UNIT/ML IJ SOLN
5000.0000 [IU] | Freq: Three times a day (TID) | INTRAMUSCULAR | Status: DC
  Administered 2023-11-14 – 2023-11-18 (×9): 5000 [IU] via SUBCUTANEOUS
  Filled 2023-11-14 (×14): qty 1

## 2023-11-14 MED ORDER — HEPARIN SODIUM (PORCINE) 1000 UNIT/ML DIALYSIS: 1000.0000 [IU] | INTRAMUSCULAR | Status: DC | PRN

## 2023-11-14 MED ORDER — LIDOCAINE-PRILOCAINE 2.5-2.5 % EX CREA: 1.0000 | TOPICAL_CREAM | CUTANEOUS | Status: DC | PRN

## 2023-11-14 MED ORDER — CHLORHEXIDINE GLUCONATE CLOTH 2 % EX PADS
6.0000 | MEDICATED_PAD | Freq: Every day | CUTANEOUS | Status: DC
  Administered 2023-11-15: 6 via TOPICAL

## 2023-11-14 MED ORDER — TRIMETHOBENZAMIDE HCL 100 MG/ML IM SOLN
200.0000 mg | Freq: Four times a day (QID) | INTRAMUSCULAR | Status: DC | PRN
  Administered 2023-11-19: 200 mg via INTRAMUSCULAR
  Filled 2023-11-14 (×3): qty 2

## 2023-11-14 MED ORDER — AMLODIPINE BESYLATE 5 MG PO TABS
10.0000 mg | ORAL_TABLET | Freq: Once | ORAL | Status: AC
  Administered 2023-11-14: 10 mg via ORAL
  Filled 2023-11-14: qty 2

## 2023-11-14 MED ORDER — FUROSEMIDE 10 MG/ML IJ SOLN
40.0000 mg | INTRAMUSCULAR | Status: AC
  Administered 2023-11-14: 40 mg via INTRAVENOUS
  Filled 2023-11-14: qty 4

## 2023-11-14 MED ORDER — OXYCODONE HCL 5 MG PO TABS
15.0000 mg | ORAL_TABLET | Freq: Once | ORAL | Status: AC
  Administered 2023-11-14: 15 mg via ORAL
  Filled 2023-11-14: qty 3

## 2023-11-14 MED ORDER — NITROGLYCERIN 2 % TD OINT
0.5000 [in_us] | TOPICAL_OINTMENT | Freq: Once | TRANSDERMAL | Status: AC
  Administered 2023-11-14: 0.5 [in_us] via TOPICAL
  Filled 2023-11-14: qty 1

## 2023-11-14 NOTE — Assessment & Plan Note (Addendum)
 Lupus-has not seen rheumatologist since last year, not on any currently. HTN-continue home amlodipine 10 mg daily, Coreg 25 mg BID, irbesartan 300 mg daily Calciphylaxis-continue home oxycodone 15 mg q6 PRN GERD-continue home Protonix 40 mg daily

## 2023-11-14 NOTE — Plan of Care (Signed)
 FMTS Brief Progress Note  S: Evaluated patient at bedside with Dr. Melissa Noon.  Complains of headache, otherwise feeling okay.   O: BP (!) 130/103   Pulse 71   Temp 98 F (36.7 C) (Oral)   Resp 18   Ht 5\' 6"  (1.676 m)   Wt 72.6 kg   SpO2 100%   BMI 25.82 kg/m   GEN: No acute distress, resting comfortably CV: Regular rate and rhythm, no murmurs, 2+ radial pulses Pulm: Clear to auscultation bilaterally, normal work of breathing on room air Abdomen: Normal bowel sounds, soft, nontender  A/P: Dyspnea Has improved currently.  Suspect secondary to volume overload.  No chest pain at this time. - HD planned for tonight - Will continue to monitor symptoms  Hypertension Controlled hypertension, suspect secondary to missed dialysis.  Holding home irbesartan given hyperkalemia.  Has nitro patch on from ED.  Blood pressure has slightly improved 130/103. - HD tonight - Continue to monitor blood pressures - Consider nitro drip if blood pressures continue to rise with associated chest pain  Headache Likely secondary to hypertension and Nitropatch - Will reevaluate after HD to see if this is improved  Rest of plan per day team  - Orders reviewed. Labs for AM ordered, which was adjusted as needed.  - If condition changes, plan includes re-evaluate.   Anihya Tuma, DO 11/14/2023, 9:03 PM PGY-1, [facility] Night Resident  Please page [phone] with questions.

## 2023-11-14 NOTE — Assessment & Plan Note (Signed)
 Echo last month 2/25 shows EF of 40-45% with LV hypokinesis and severe concentric left ventricular hypertrophy.  CXR shows cardiomegaly and on exam does appear to be stable with no crackles, JVD or BLE on exam. -Cardiac monitoring

## 2023-11-14 NOTE — ED Triage Notes (Addendum)
 Patient BIB [facility] from home due to SOB. Patient states yesterday she had an onset of low back pain, SOB, cough, diarrhea, and abdominal pain. Patient states overall pain is 8/10. Patient missed last 2 dialysis treatments due to transportation issues. Patient is A&Ox4. Dialysis schedule is MWF.

## 2023-11-14 NOTE — Assessment & Plan Note (Addendum)
 Missed multiple HD session and showing uremic signs (Low appetite, nausea, vomiting)  On MWF schedule. Nephrology consulted by ED and planning HD on 3/17 - Nephrology consulted, appreciate recommendations - 24 continuous cardiac monitoring given hyperkalemia - Renal diet with fluid restriction -Continue supplemental oxygen as needed to maintain oxygen saturation; wean as able - AM CBC, RFP, Mag

## 2023-11-14 NOTE — H&P (Addendum)
 Hospital Admission History and Physical Service Pager: [phone]  Patient name: Linda [name] Medical record number: [id number] Date of Birth: 1995-10-11 Age: 28 y.o. Gender: female  Primary Care Provider: Pcp, No Consultants: Nephro Code Status: Full  Preferred Emergency Contact:  Contact Information     Name Relation Home Work Mobile   [place] Grandmother [phone]      Chief Complaint: Shortness of breath  Assessment and Plan: Linda [name] is a 28 y.o. female presenting with increasing shortness of breath after missing last 2 HD sessions. Differential for presentation of this includes CHF, PNA, or other infectious etiology.   -Most likely mixed presentation due to hypervolemia 2/2 missing HD sessions.  Patient with uremic symptoms of abdominal pain, loss of appetite, and also has hyperkalemia. - Consider CHF exacerbation, though less likely given pt history more convincing for issues stemming from missing HD. - Consider pneumonia or other infectious etiology, however patient afebrile, with only mild leukocytosis, no acute findings of PNA on CXR. -- PE considered but low suspicion given reassuring O2 stat on RA, no tachypnea or tachycardia.  Assessment & Plan Dyspnea Has multiple factors that could be contributing to dyspnea which includes possible viral URI,Tobacco use, CHF exacebation or Volume overloaded due to missed dialysis. Suspect combination of viral URI and volume overloaded. Reassuring patient is stating appropriately on room air with good WOB.  Hoping for improved symptoms with dialysis. - Admit to FMTS, attending Dr. Manson Passey - Med Tele, Vital signs per floor - PT/OT to treat - VTE prophylaxis - heparin -Continue supplemental oxygen as needed to maintain oxygen saturation; wean as able - AM CBC, RFP, Mag -Continue home hydroxyzine 10 mg TID PRN for itching ESRD (end stage renal disease) on dialysis (HCC) Missed multiple HD session and showing  uremic signs (Low appetite, nausea, vomiting)  On MWF schedule. Nephrology consulted by ED and planning HD on 3/17 - Nephrology consulted, appreciate recommendations - 24 continuous cardiac monitoring given hyperkalemia - Renal diet with fluid restriction -Continue supplemental oxygen as needed to maintain oxygen saturation; wean as able - AM CBC, RFP, Mag Abdominal pain Improved during admission. Unclear cause at this time suspect possible gastritis, GERD or viral Gastroenteritis Abdominal CT has questionable acute cholecystitis which was eventually ruled out by right upper quadrant ultrasound.  Symptoms could also be associated with patient's missing dialysis - Hold home Zofran until QTc normalizes - Can offer Tigan 200 mg q6 PRN for nausea/vomiting -- Could modify diet should pain persist. Chronic health problem Lupus-has not seen rheumatologist since last year, not on any currently. HTN-continue home amlodipine 10 mg daily, Coreg 25 mg BID, irbesartan 300 mg daily Calciphylaxis-continue home oxycodone 15 mg q6 PRN GERD-continue home Protonix 40 mg daily  Chronic heart failure with preserved ejection fraction (HFpEF) (HCC) Echo last month 2/25 shows EF of 40-45% with LV hypokinesis and severe concentric left ventricular hypertrophy.  CXR shows cardiomegaly and on exam does appear to be stable with no crackles, JVD or BLE on exam. -Cardiac monitoring  FEN/GI: Renal diet, fluid restriction VTE Prophylaxis: heparin  Disposition: Med telemetry  History of Present Illness:  Linda [name] is a 28 y.o. female presenting with worsening dyspnea in the last few days.  She reports she was evicted recently and so missed her last 2 dialysis sessions on Wednesday and Friday.  In the last couple of days she has had worsening shortness of breath, difficulty walking due to this dyspnea, increased coughing, decreased  appetite, abdominal bloating, nausea, and vomiting.  She reports 1 episode  of diarrhea 2 days ago, no BM since then.  Denies any fevers but has had hot and cold "flashes," and denies sick symptoms of sneezing, sore throat, sinus pressure.  Does report her cousin was sick with the flu recently but she has not been around her much.  She also reports significant back and bottom pain, primarily due to calciphylaxis.  She takes oxycodone at home with some relief.  Reports she has had Dilaudid before when hospitalized and says that does help some.  She does not know all of the medications that she takes, but she thinks that she last took her regular medicines yesterday 3/15.  Her mom used to help her manage all of her medical conditions and medications, though unfortunately passed away patient has been trying to manage this on her own.  In the ED, CBC with leukocytosis to 11.1, anemia with hemoglobin at 9.2, hyperkalemia to 5.3, anion gap 21.  Lipase WNL and troponins downtrending.  Respiratory panel negative for COVID/flu/RSV.  Chest x-ray and CT A/P largely unremarkable, though CTAP does show possible acute cholecystitis.  This was further worked up with abdominal ultrasound which showed no evidence of cholecystitis.  Nephrology was consulted and requested admission for inpatient HD. In ED received 40 mg IV Lasix, as well as home BP meds amlodipine, coreg.  Review Of Systems: Per HPI.  Pertinent Past Medical History: ESRD-HD MWF HTN Lupus Migraines GERD HFpEF Remainder reviewed in history tab.   Pertinent Past Surgical History: Unknown - need to confirm with pt.  Remainder reviewed in history tab.   Pertinent Social History: Tobacco use: 2-3 Cig for ~74yrs Alcohol use: Occassionally Other Substance use: Marijuana  Lives with Grandma after recently being evicted  Pertinent Family History: Mother-lupus, HTN, kidney disease, CAD Grandma- DM Uncle- recently dx w/ cancer Remainder reviewed in history tab.   Important Outpatient  Medications: Oxycodone Irbesartan? Remainder reviewed in medication history.   Objective: BP (!) 144/105   Pulse 74   Temp (!) 97.2 F (36.2 C) (Oral)   Resp 18   Ht 5\' 6"  (1.676 m)   Wt 72.6 kg   SpO2 100%   BMI 25.82 kg/m  Exam: General: chronically ill-appearing, sitting up in bed, no acute distress. HEENT: normocephalic, PERRLA, EOM grossly intact, MMM, nares patent without rhinorrhea. Cardio: Regular rate, regular rhythm, no murmurs on exam. Pulm: Bilateral crackles, no increased work of breathing. On O2 via [place].  Abdominal: bowel sounds present, soft, somewhat tender in the periumbilical region, mildly distended. Extremities: no peripheral edema. Moves all extremities equally.  Neurovascularly intact. Neuro: Alert and oriented x3, speech normal in content, no facial asymmetry. Psych:  Cognition and judgment appear intact. Alert, communicative, and cooperative with normal attention span and concentration.   Labs:  CBC BMET  Recent Labs  Lab 11/14/23 0638  WBC 11.1*  HGB 9.2*  HCT 29.8*  PLT 184   Recent Labs  Lab 11/14/23 0638  NA 138  K 5.3*  CL 102  CO2 15*  BUN 107*  CREATININE 14.90*  GLUCOSE 79  CALCIUM 8.8*     Troponin 77 > 62 Lipase WNL 29 Respiratory panel negative  3/16 CXR: Cardiac enlargement.  No acute findings.  3/16 CT A/P: 1. Possible acute cholecystitis. No defined gallstone. No bile duct dilation. Recommend ultrasound for further assessment if symptoms suggest acute cholecystitis. 2. No other evidence of an acute abnormality within the abdomen or pelvis.  3. Stable hepatomegaly. 4. Aortic atherosclerosis. 5. Small amount of nonspecific ascites/free fluid in the posterior pelvic recess.  3/16 US abdomen:  1. No sonographic evidence of acute cholecystitis. 2. Hepatomegaly with diffuse hepatic steatosis. 3. Echogenic appearance of the right renal parenchyma, which can be seen in the setting of medical renal disease.  Jerre Simon,  MD 11/14/2023, 2:24 PM PGY-3, [facility]  FPTS Intern pager: [phone], text pages welcome Secure chat group [facility] Teaching Service

## 2023-11-14 NOTE — Care Plan (Signed)
 Called HD unit and verified that Ms. Canny is on schedule to receive HD this evening, approx 2 patients ahead of her before she goes to the unit.  Darral Dash, DO

## 2023-11-14 NOTE — Plan of Care (Signed)

## 2023-11-14 NOTE — Assessment & Plan Note (Signed)
 Resolved this morning. - Can offer Tigan 200 mg q6 PRN for nausea/vomiting -- Could modify diet should pain persist.

## 2023-11-14 NOTE — ED Notes (Signed)
Pt states she does not need to urinate at this time  ?

## 2023-11-14 NOTE — Consult Note (Addendum)
 [facility] Renal Consultation Note    Indication for Consultation:  Management of ESRD/hemodialysis, anemia, hypertension/volume, and secondary hyperparathyroidism.  HPI: Linda [name] is a 28 y.o. female with PMH including ESRD on dialysis, lupus, HTN, who presented to the ED this AM. She reports her primary concern is shortness of breath since last night, worse with movement or laying down. Also reports she was having chest and back pain, which have now resolved. Reported nausea and diarrhea, reports these are resolved at present as well. Denies headaches and dizziness, reports vision overall declining but denies any acute changes. Reports she is having chills, no fever reported. Still makes urine and denies dysuria, hematuria, fever, flank pain. Denies peripheral edema. She reports she missed dialysis on Wednesday and Friday because she was evicted from her home. Unfortunately compliance is an ongoing issue for her. O2 sat 96% of RA, currently 100% on  which was placed for comfort. CXR with no acute abnormalities. Labs notable for K+ 5.3, BUN 107, Cr 14.9, Ca 8.8, WBC 11.1, Hgb 9.2 Plt 184. Home BP meds were resumed in the ED and she was given nitroglycerin ointment and oxycodone for her chest pain.   Past Medical History:  Diagnosis Date   Abnormal ECG    a. In setting of LVH w/ repolarization abnormalities; b. 03/2019 MV: No ischemia/infarct. EF 55-65%.   Angioedema    ESRD (end stage renal disease) (HCC)    on HD (M,W,F)   GERD (gastroesophageal reflux disease)    Hypertension    Lupus    LVH (left ventricular hypertrophy)    a. 11/2017 Echo: EF 50-55%. triv AI. Mild MR. Mod L and mild R atrial enlargement. Nl RV size/fxn. Small pericardial effusion w/o tampondae; b. 03/2018 Echo: EF 55-60%, restrictive filling pattern. Nl RV size/fxn. Sev LAE. Mild MR, mild to mod TR/PR. Mod PAH. Triv effusion.   Migraines    Mixed connective tissue disease (HCC)    Previous Dialysis  patient [facility])    a. Off HD since late 2020.   Septic prepatellar bursitis of right knee 08/19/2017   Past Surgical History:  Procedure Laterality Date   GRAFT APPLICATION Left 03/30/2019   Procedure: BRACHIAL CEPHALIC GRAFT CREATION;  Surgeon: Annice Needy, MD;  Location: [facility] ORS;  Service: Vascular;  Laterality: Left;   I & D EXTREMITY Right 08/06/2017   Procedure: IRRIGATION AND DEBRIDEMENT KNEE;  Surgeon: Roby Lofts, MD;  Location: [facility] OR;  Service: Orthopedics;  Laterality: Right;   IR THROMBECTOMY AV FISTULA W/THROMBOLYSIS/PTA/STENT INC/SHUNT/IMG LT Left 05/18/2022   IR US GUIDE VASC ACCESS LEFT  05/18/2022   MULTIPLE TOOTH EXTRACTIONS     Family History  Problem Relation Age of Onset   Lupus Mother    Hypertension Mother    Kidney disease Mother    Heart Problems Mother    Coronary artery disease Mother 28       stent   Diabetes Maternal Grandmother     Physical Exam: Vitals:   11/14/23 0813 11/14/23 0900 11/14/23 0915 11/14/23 0930  BP:  (!) 153/121 (!) 143/116 (!) 144/115  Pulse: 100 87 83 79  Resp: 18 17 13 14   Temp:      TempSrc:      SpO2: 100% 100% 100% 100%  Weight:      Height:         General: Well developed, alert female in NAD Head: Normocephalic, atraumatic, sclera non-icteric, mucus membranes are moist. Lungs: Clear bilaterally to auscultation  without wheezes, rales, or rhonchi. Breathing is unlabored on O2 via [place]. Heart: RRR with normal S1, S2. No murmurs, rubs, or gallops appreciated. Abdomen: Soft, non-distended with normoactive bowel sounds. No obvious abdominal masses. Musculoskeletal:  Strength and tone appear normal for age. Lower extremities: No edema b/l lower extremities. Neuro: Alert and oriented X 3. Moves all extremities spontaneously. Psych:  Responds to questions appropriately with a normal affect. Dialysis Access: LUE AVG + T/b  Allergies  Allergen Reactions   Iodine Hives and Shortness Of Breath   Lisinopril Anaphylaxis     Angioedema   Gabapentin Other (See Comments)    Reaction: Tremor (intolerance); tremor   Ativan [Lorazepam] Other (See Comments)    Somnolent with 1mg  ativan at [facility] Nov 2019   Hydroxychloroquine Other (See Comments)    Pt reports making her skin peel really bad   Vicodin [Hydrocodone-Acetaminophen] Itching and Rash   Prior to Admission medications   Medication Sig Start Date End Date Taking? Authorizing Provider  acetaminophen (TYLENOL) 325 MG tablet Take 2 tablets (650 mg total) by mouth every 6 (six) hours as needed for mild pain (pain score 1-3) or moderate pain (pain score 4-6) (or Fever >/= 101). 09/13/23   Hongalgi, Maximino Greenland, MD  amLODipine (NORVASC) 10 MG tablet Take 10 mg by mouth daily. 08/05/21   [provider]  calcitRIOL (ROCALTROL) 0.25 MCG capsule Take 1 tablet by mouth daily.    [provider]  camphor-menthol Wynelle Fanny) lotion Apply topically as needed for itching. 10/28/23   Rai, Delene Ruffini, MD  carvedilol (COREG) 25 MG tablet Take 1 tablet (25 mg total) by mouth 2 (two) times daily with a meal. 09/03/23   Rhetta Mura, MD  diclofenac Sodium (VOLTAREN) 1 % GEL Apply 2 g topically 4 (four) times daily. Patient not taking: Reported on 10/10/2023 09/23/23   Long, Arlyss Repress, MD  dicyclomine (BENTYL) 10 MG capsule Take 1 capsule (10 mg total) by mouth 4 (four) times daily -  before meals and at bedtime for 5 days. Abdominal cramps. Patient not taking: Reported on 10/24/2023 10/12/23 10/17/23  Joycelyn Das, MD  furosemide (LASIX) 80 MG tablet Take 80 mg by mouth 3 (three) times daily. 07/30/23   [provider]  hydrOXYzine (ATARAX) 10 MG tablet Take 1 tablet (10 mg total) by mouth 3 (three) times daily as needed for itching. 10/28/23   Rai, Ripudeep K, MD  ibuprofen (ADVIL) 800 MG tablet Take 800 mg by mouth every 8 (eight) hours as needed.    [provider]  irbesartan (AVAPRO) 300 MG tablet Take 1 tablet (300 mg total) by mouth daily.  10/27/23   Almon Hercules, MD  medroxyPROGESTERone (DEPO-PROVERA) 150 MG/ML injection Inject 1 mL (150 mg total) into the muscle every 3 (three) months. 10/29/22   Brock Bad, MD  methocarbamol (ROBAXIN) 500 MG tablet Take 1 tablet (500 mg total) by mouth every 8 (eight) hours as needed for muscle spasms. Patient not taking: Reported on 10/24/2023 10/12/23   Joycelyn Das, MD  naloxone [facility]) nasal spray 4 mg/0.1 mL Place 1 spray into the nose once. If needed for emergency overdose.    [provider]  nitroGLYCERIN (NITROSTAT) 0.4 MG SL tablet Place 1 tablet (0.4 mg total) under the tongue every 5 (five) minutes as needed for chest pain. 10/28/23   Rai, Ripudeep K, MD  ondansetron (ZOFRAN-ODT) 4 MG disintegrating tablet Take 1 tablet (4 mg total) by mouth every 8 (eight) hours as  needed for nausea or vomiting. 10/28/23   Rai, Ripudeep K, MD  oxyCODONE (ROXICODONE) 15 MG immediate release tablet Take 1 tablet (15 mg total) by mouth every 6 (six) hours as needed for pain. 10/28/23   Rai, Delene Ruffini, MD  pantoprazole (PROTONIX) 40 MG tablet Take 1 tablet (40 mg total) by mouth daily as needed (for acid reflux). 09/13/23 10/24/23  Hongalgi, Maximino Greenland, MD  RENVELA 2.4 g PACK Take 2.4 g by mouth See admin instructions. Take with a large meal once a day to once every other day. 12/03/22   [provider]  Safety Seal Miscellaneous MISC Use on the alternate nights when you are not applying tretinoin (Tuesday, Thursday, Saturday, and Sunday) Patient not taking: Reported on 10/24/2023 05/24/23   Terri Piedra, DO  sodium bicarbonate 650 MG tablet Take 1 tablet (650 mg total) by mouth 2 (two) times daily. 09/05/21   Leroy Sea, MD  tiZANidine (ZANAFLEX) 4 MG tablet Take 4 mg by mouth 3 (three) times daily. 10/08/23   [provider]  tretinoin (RETIN-A) 0.025 % cream Apply 1 application  topically at bedtime.    [provider]  Vitamin D, Ergocalciferol, (DRISDOL) 1.25 MG  (50000 UNIT) CAPS capsule ergocalciferol (vitamin D2) 1,250 mcg (50,000 unit) capsule TAKE 1 CAPSULE BY MOUTH WEEKLY 03/11/21   [provider]   Current Facility-Administered Medications  Medication Dose Route Frequency Provider Last Rate Last Admin   carvedilol (COREG) tablet 25 mg  25 mg Oral BID WC Terald Sleeper, MD   25 mg at 11/14/23 7253   irbesartan (AVAPRO) tablet 300 mg  300 mg Oral Daily Terald Sleeper, MD   300 mg at 11/14/23 6644   Current Outpatient Medications  Medication Sig Dispense Refill   acetaminophen (TYLENOL) 325 MG tablet Take 2 tablets (650 mg total) by mouth every 6 (six) hours as needed for mild pain (pain score 1-3) or moderate pain (pain score 4-6) (or Fever >/= 101).     amLODipine (NORVASC) 10 MG tablet Take 10 mg by mouth daily.     calcitRIOL (ROCALTROL) 0.25 MCG capsule Take 1 tablet by mouth daily.     camphor-menthol (SARNA) lotion Apply topically as needed for itching. 222 mL 1   carvedilol (COREG) 25 MG tablet Take 1 tablet (25 mg total) by mouth 2 (two) times daily with a meal.     diclofenac Sodium (VOLTAREN) 1 % GEL Apply 2 g topically 4 (four) times daily. (Patient not taking: Reported on 10/10/2023) 100 g 0   dicyclomine (BENTYL) 10 MG capsule Take 1 capsule (10 mg total) by mouth 4 (four) times daily -  before meals and at bedtime for 5 days. Abdominal cramps. (Patient not taking: Reported on 10/24/2023) 20 capsule 0   furosemide (LASIX) 80 MG tablet Take 80 mg by mouth 3 (three) times daily.     hydrOXYzine (ATARAX) 10 MG tablet Take 1 tablet (10 mg total) by mouth 3 (three) times daily as needed for itching. 30 tablet 0   ibuprofen (ADVIL) 800 MG tablet Take 800 mg by mouth every 8 (eight) hours as needed.     irbesartan (AVAPRO) 300 MG tablet Take 1 tablet (300 mg total) by mouth daily. 90 tablet 0   medroxyPROGESTERone (DEPO-PROVERA) 150 MG/ML injection Inject 1 mL (150 mg total) into the muscle every 3 (three) months. 1 mL 0    methocarbamol (ROBAXIN) 500 MG tablet Take 1 tablet (500 mg total) by mouth every  8 (eight) hours as needed for muscle spasms. (Patient not taking: Reported on 10/24/2023) 20 tablet 0   naloxone (NARCAN) nasal spray 4 mg/0.1 mL Place 1 spray into the nose once. If needed for emergency overdose.     nitroGLYCERIN (NITROSTAT) 0.4 MG SL tablet Place 1 tablet (0.4 mg total) under the tongue every 5 (five) minutes as needed for chest pain. 30 tablet 12   ondansetron (ZOFRAN-ODT) 4 MG disintegrating tablet Take 1 tablet (4 mg total) by mouth every 8 (eight) hours as needed for nausea or vomiting. 20 tablet 0   oxyCODONE (ROXICODONE) 15 MG immediate release tablet Take 1 tablet (15 mg total) by mouth every 6 (six) hours as needed for pain. 20 tablet 0   pantoprazole (PROTONIX) 40 MG tablet Take 1 tablet (40 mg total) by mouth daily as needed (for acid reflux).     RENVELA 2.4 g PACK Take 2.4 g by mouth See admin instructions. Take with a large meal once a day to once every other day.     Safety Seal Miscellaneous MISC Use on the alternate nights when you are not applying tretinoin (Tuesday, Thursday, Saturday, and Sunday) (Patient not taking: Reported on 10/24/2023) 30 g 2   sodium bicarbonate 650 MG tablet Take 1 tablet (650 mg total) by mouth 2 (two) times daily. 60 tablet 0   tiZANidine (ZANAFLEX) 4 MG tablet Take 4 mg by mouth 3 (three) times daily.     tretinoin (RETIN-A) 0.025 % cream Apply 1 application  topically at bedtime.     Vitamin D, Ergocalciferol, (DRISDOL) 1.25 MG (50000 UNIT) CAPS capsule ergocalciferol (vitamin D2) 1,250 mcg (50,000 unit) capsule TAKE 1 CAPSULE BY MOUTH WEEKLY     Labs: Basic Metabolic Panel: Recent Labs  Lab 11/14/23 0638  NA 138  K 5.3*  CL 102  CO2 15*  GLUCOSE 79  BUN 107*  CREATININE 14.90*  CALCIUM 8.8*   CBC: Recent Labs  Lab 11/14/23 0638  WBC 11.1*  NEUTROABS 6.3  HGB 9.2*  HCT 29.8*  MCV 98.3  PLT 184   Studies/Results: DG Chest Portable 1  View Result Date: 11/14/2023 CLINICAL DATA:  Shortness of breath. EXAM: PORTABLE CHEST 1 VIEW COMPARISON:  10/24/2023. FINDINGS: Cardiac enlargement. No pleural fluid, interstitial edema, or airspace consolidation. Visualized osseous structures are unremarkable. IMPRESSION: Cardiac enlargement. No acute findings. Electronically Signed   By: Signa Kell M.D.   On: 11/14/2023 07:13    Dialysis Orders: Center: [facility]  on MWF . 180NRe 3hr BFR 350 DFR 500 EDW 71.5kg 2K 2Ca  AVG 15g  No heparin Mircera IV q 2 weeks- last dose 11/01/23 Hectorol IV q HD Sensipar 180mg  PO q HD  Assessment/Plan:  ESRD:  Patient missed dialysis x2. She does report shortness of breath but respiratory status is stable and CXR unremarkable. Having some possible uremic symptoms (nausea) but improved at present. Will plan for HD this evening Chest pain: may be related to HTN, resolved at present. Mgt per admitting team  Hypertension/volume: BP elevated, resumed home meds and UF with HD tomorrow  Anemia: Hgb 9.2, not due for ESA yet  Metabolic bone disease: Calcium controlled. Continue hectorol, sensipar and phos binders  Nutrition:  Will need renal diet/fluid restrictions  Rogers Blocker, PA-C 11/14/2023, 10:14 AM  [facility] Pager: [phone]

## 2023-11-14 NOTE — ED Notes (Signed)
 Pt placed on 3L oxygen via [place]; sats on RA 96%; placed for comfort

## 2023-11-14 NOTE — ED Provider Notes (Signed)
 [facility]ERGENCY DEPARTMENT AT [facility] Provider Note   CSN: [id number] Arrival date & time: 11/14/23  8295     History  Chief Complaint  Patient presents with   Shortness of Breath    Linda [name] is a 28 y.o. female with  PMHx lupus, lupus nephritis, ESRD on MWF dialysis, GERD, HTN, migraines, CHF with preserved EF, pulmonary edema who presents to ED concerned for SOB, cough, diarrhea, vomiting, epigastric abdominal pain that has been progressing over the past 2 days. Also endorses slight central chest pain. Patient stating that she has not had dialysis since 3/10 d/t transportation difficulties and recently being evicted from her home.  Patient does void urine.    Shortness of Breath      Home Medications Prior to Admission medications   Medication Sig Start Date End Date Taking? Authorizing Provider  lidocaine-prilocaine (EMLA) cream Apply 1 Application topically. 10/20/23  Yes [provider]  acetaminophen (TYLENOL) 325 MG tablet Take 2 tablets (650 mg total) by mouth every 6 (six) hours as needed for mild pain (pain score 1-3) or moderate pain (pain score 4-6) (or Fever >/= 101). 09/13/23   Hongalgi, Maximino Greenland, MD  amLODipine (NORVASC) 10 MG tablet Take 10 mg by mouth daily. 08/05/21   [provider]  calcitRIOL (ROCALTROL) 0.25 MCG capsule Take 1 tablet by mouth daily.    [provider]  camphor-menthol Wynelle Fanny) lotion Apply topically as needed for itching. 10/28/23   Rai, Delene Ruffini, MD  carvedilol (COREG) 25 MG tablet Take 1 tablet (25 mg total) by mouth 2 (two) times daily with a meal. 09/03/23   Rhetta Mura, MD  diclofenac Sodium (VOLTAREN) 1 % GEL Apply 2 g topically 4 (four) times daily. Patient not taking: Reported on 10/10/2023 09/23/23   Long, Arlyss Repress, MD  dicyclomine (BENTYL) 10 MG capsule Take 1 capsule (10 mg total) by mouth 4 (four) times daily -  before meals and at bedtime for 5 days. Abdominal cramps. Patient  not taking: Reported on 10/24/2023 10/12/23 10/17/23  Joycelyn Das, MD  furosemide (LASIX) 80 MG tablet Take 80 mg by mouth 3 (three) times daily. 07/30/23   [provider]  hydrOXYzine (ATARAX) 10 MG tablet Take 1 tablet (10 mg total) by mouth 3 (three) times daily as needed for itching. 10/28/23   Rai, Ripudeep K, MD  ibuprofen (ADVIL) 800 MG tablet Take 800 mg by mouth every 8 (eight) hours as needed.    [provider]  irbesartan (AVAPRO) 300 MG tablet Take 1 tablet (300 mg total) by mouth daily. 10/27/23   Almon Hercules, MD  medroxyPROGESTERone (DEPO-PROVERA) 150 MG/ML injection Inject 1 mL (150 mg total) into the muscle every 3 (three) months. 10/29/22   Brock Bad, MD  methocarbamol (ROBAXIN) 500 MG tablet Take 1 tablet (500 mg total) by mouth every 8 (eight) hours as needed for muscle spasms. Patient not taking: Reported on 10/24/2023 10/12/23   Joycelyn Das, MD  naloxone [facility]) nasal spray 4 mg/0.1 mL Place 1 spray into the nose once. If needed for emergency overdose.    [provider]  nitroGLYCERIN (NITROSTAT) 0.4 MG SL tablet Place 1 tablet (0.4 mg total) under the tongue every 5 (five) minutes as needed for chest pain. 10/28/23   Rai, Delene Ruffini, MD  ondansetron (ZOFRAN-ODT) 4 MG disintegrating tablet Take 1 tablet (4 mg total) by mouth every 8 (eight) hours as needed for nausea or vomiting. 10/28/23   Rai,  Ripudeep K, MD  oxyCODONE (ROXICODONE) 15 MG immediate release tablet Take 1 tablet (15 mg total) by mouth every 6 (six) hours as needed for pain. 10/28/23   Rai, Delene Ruffini, MD  pantoprazole (PROTONIX) 40 MG tablet Take 1 tablet (40 mg total) by mouth daily as needed (for acid reflux). 09/13/23 10/24/23  Hongalgi, Maximino Greenland, MD  RENVELA 2.4 g PACK Take 2.4 g by mouth See admin instructions. Take with a large meal once a day to once every other day. 12/03/22   [provider]  Safety Seal Miscellaneous MISC Use on the alternate nights when you are  not applying tretinoin (Tuesday, Thursday, Saturday, and Sunday) Patient not taking: Reported on 10/24/2023 05/24/23   Terri Piedra, DO  sodium bicarbonate 650 MG tablet Take 1 tablet (650 mg total) by mouth 2 (two) times daily. 09/05/21   Leroy Sea, MD  tiZANidine (ZANAFLEX) 4 MG tablet Take 4 mg by mouth 3 (three) times daily. 10/08/23   [provider]  tretinoin (RETIN-A) 0.025 % cream Apply 1 application  topically at bedtime.    [provider]  Vitamin D, Ergocalciferol, (DRISDOL) 1.25 MG (50000 UNIT) CAPS capsule ergocalciferol (vitamin D2) 1,250 mcg (50,000 unit) capsule TAKE 1 CAPSULE BY MOUTH WEEKLY 03/11/21   [provider]      Allergies    Iodine, Lisinopril, Gabapentin, Ativan [lorazepam], Hydroxychloroquine, and Vicodin [hydrocodone-acetaminophen]    Review of Systems   Review of Systems  Respiratory:  Positive for shortness of breath.     Physical Exam Updated Vital Signs BP (!) 151/117   Pulse 73   Temp (!) 97.3 F (36.3 C) (Oral)   Resp 19   Ht 5\' 6"  (1.676 m)   Wt 72.6 kg   SpO2 100%   BMI 25.82 kg/m  Physical Exam Vitals and nursing note reviewed.  Constitutional:      General: She is not in acute distress.    Appearance: She is ill-appearing. She is not toxic-appearing.  HENT:     Head: Normocephalic and atraumatic.     Mouth/Throat:     Mouth: Mucous membranes are moist.     Pharynx: No posterior oropharyngeal erythema.  Eyes:     General: No scleral icterus.       Right eye: No discharge.        Left eye: No discharge.     Conjunctiva/sclera: Conjunctivae normal.  Cardiovascular:     Rate and Rhythm: Normal rate and regular rhythm.     Pulses: Normal pulses.     Heart sounds: No murmur heard. Pulmonary:     Effort: Pulmonary effort is normal. No respiratory distress.     Breath sounds: Rales present. No wheezing or rhonchi.     Comments: Diffuse rales - more prominent on the right. Abdominal:     General:  Bowel sounds are normal.     Palpations: Abdomen is soft. There is no mass.     Tenderness: There is abdominal tenderness.     Comments: Epigastric tenderness to palpation  Musculoskeletal:     Right lower leg: No edema.     Left lower leg: No edema.  Skin:    General: Skin is warm and dry.     Findings: No rash.  Neurological:     General: No focal deficit present.     Mental Status: She is alert and oriented to person, place, and time. Mental status is at baseline.  Psychiatric:  Mood and Affect: Mood normal.     ED Results / Procedures / Treatments   Labs (all labs ordered are listed, but only abnormal results are displayed) Labs Reviewed  CBC WITH DIFFERENTIAL/PLATELET - Abnormal; Notable for the following components:      Result Value   WBC 11.1 (*)    RBC 3.03 (*)    Hemoglobin 9.2 (*)    HCT 29.8 (*)    RDW 18.8 (*)    Eosinophils Absolute 0.6 (*)    All other components within normal limits  BASIC METABOLIC PANEL - Abnormal; Notable for the following components:   Potassium 5.3 (*)    CO2 15 (*)    BUN 107 (*)    Creatinine, Ser 14.90 (*)    Calcium 8.8 (*)    GFR, Estimated 3 (*)    Anion gap 21 (*)    All other components within normal limits  HEPATIC FUNCTION PANEL - Abnormal; Notable for the following components:   Albumin 3.1 (*)    Bilirubin, Direct 0.4 (*)    All other components within normal limits  TROPONIN I (HIGH SENSITIVITY) - Abnormal; Notable for the following components:   Troponin I (High Sensitivity) 77 (*)    All other components within normal limits  TROPONIN I (HIGH SENSITIVITY) - Abnormal; Notable for the following components:   Troponin I (High Sensitivity) 62 (*)    All other components within normal limits  RESP PANEL BY RT-PCR (RSV, FLU A&B, COVID)  RVPGX2  LIPASE, BLOOD  URINALYSIS, ROUTINE W REFLEX MICROSCOPIC  HEPATITIS B SURFACE ANTIGEN  HEPATITIS B SURFACE ANTIBODY, QUANTITATIVE    EKG EKG  Interpretation Date/Time:  Sunday November 14 2023 06:25:07 EDT Ventricular Rate:  99 PR Interval:  183 QRS Duration:  91 QT Interval:  397 QTC Calculation: 510 R Axis:   36  Text Interpretation: Sinus rhythm Biatrial enlargement Borderline repolarization abnormality Prolonged QT interval Confirmed by Palumbo, April ([id number]) on 11/14/2023 6:38:18 AM  Radiology CT ABDOMEN PELVIS WO CONTRAST Result Date: 11/14/2023 CLINICAL DATA:  Abdominal pain. EXAM: CT ABDOMEN AND PELVIS WITHOUT CONTRAST TECHNIQUE: Multidetector CT imaging of the abdomen and pelvis was performed following the standard protocol without IV contrast. RADIATION DOSE REDUCTION: This exam was performed according to the departmental dose-optimization program which includes automated exposure control, adjustment of the mA and/or kV according to patient size and/or use of iterative reconstruction technique. COMPARISON:  09/01/2023. FINDINGS: Lower chest: Stable cardiomegaly. Mild dependent lung base atelectasis. Mild interstitial thickening. No overt pulmonary edema or convincing acute finding. Hepatobiliary: Enlarged liver, 23 cm superior to inferior, stable. Normal liver attenuation. No mass. Gallbladder wall is not well-defined, but may be mildly thickened. There is hazy inflammatory type change in the pericholecystic fat. No defined gallstone, however. No bile duct dilation. Pancreas: Unremarkable. No pancreatic ductal dilatation or surrounding inflammatory changes. Spleen: Normal in size without focal abnormality. Adrenals/Urinary Tract: Normal adrenal glands. Bilateral renal cortical thinning. No defined renal mass, no stone and no hydronephrosis. Ureters normal in course and in caliber. Bladder unremarkable. Stomach/Bowel: Stomach is unremarkable. Small bowel and colon are normal in caliber. No wall thickening. No inflammation. No evidence of appendicitis. Vascular/Lymphatic: Mild aortic atherosclerosis. No aneurysm. No enlarged lymph node.  Reproductive: Uterus and bilateral adnexa are unremarkable. Other: Small amount of ascites collects in the posterior pelvic recess. Musculoskeletal: No acute fracture. Chronic bilateral pars defects at L5-S1 with a grade 1 anterolisthesis. No bone lesion. IMPRESSION: 1. Possible acute cholecystitis. No defined gallstone.  No bile duct dilation. Recommend ultrasound for further assessment if symptoms suggest acute cholecystitis. 2. No other evidence of an acute abnormality within the abdomen or pelvis. 3. Stable hepatomegaly. 4. Aortic atherosclerosis. 5. Small amount of nonspecific ascites/free fluid in the posterior pelvic recess. Electronically Signed   By: Amie Portland M.D.   On: 11/14/2023 11:18   DG Chest Portable 1 View Result Date: 11/14/2023 CLINICAL DATA:  Shortness of breath. EXAM: PORTABLE CHEST 1 VIEW COMPARISON:  10/24/2023. FINDINGS: Cardiac enlargement. No pleural fluid, interstitial edema, or airspace consolidation. Visualized osseous structures are unremarkable. IMPRESSION: Cardiac enlargement. No acute findings. Electronically Signed   By: Signa Kell M.D.   On: 11/14/2023 07:13    Procedures .Critical Care  Performed by: Dorthy Cooler, PA-C Authorized by: Dorthy Cooler, PA-C   Critical care provider statement:    Critical care time (minutes):  30   Critical care was necessary to treat or prevent imminent or life-threatening deterioration of the following conditions:  Renal failure   Critical care was time spent personally by me on the following activities:  Development of treatment plan with patient or surrogate, discussions with consultants, evaluation of patient's response to treatment, examination of patient, ordering and review of laboratory studies, ordering and review of radiographic studies, ordering and performing treatments and interventions, pulse oximetry, re-evaluation of patient's condition and review of old charts   Care discussed with: admitting  provider   Comments:     SOB in the setting of no dialysis in the past 6 days and pulmonary edema. Consulted with nephrologist and hospitalist to organize patient's treatment plan.     Medications Ordered in ED Medications  irbesartan (AVAPRO) tablet 300 mg (300 mg Oral Given 11/14/23 0811)  carvedilol (COREG) tablet 25 mg (25 mg Oral Given 11/14/23 0811)  Chlorhexidine Gluconate Cloth 2 % PADS 6 each (6 each Topical Not Given 11/14/23 1116)  acetaminophen (TYLENOL) tablet 650 mg (650 mg Oral Given 11/14/23 0718)  nitroGLYCERIN (NITROGLYN) 2 % ointment 0.5 inch (0.5 inches Topical Given 11/14/23 0720)  furosemide (LASIX) injection 40 mg (40 mg Intravenous Given 11/14/23 0719)  amLODipine (NORVASC) tablet 10 mg (10 mg Oral Given 11/14/23 0811)  oxyCODONE (Oxy IR/ROXICODONE) immediate release tablet 15 mg (15 mg Oral Given 11/14/23 9562)    ED Course/ Medical Decision Making/ A&P Clinical Course as of 11/14/23 1138  Sun Nov 14, 2023  0825 This is a 28 year old end-stage renal disease presenting to ED with shortness of breath, acute on chronic back pain, missed dialysis for 2 sessions.  Patient reports she was evicted from her house and has been living with her grandmother, but has not had access to her medications.  She has not been able to get to dialysis.  On exam the patient has some borderline tachycardia.  She is quite hypertensive.  She has not taken morning blood pressure meds.  She is mildly tachypneic but is not hypoxic.  She does have rales on pulmonary exam.  Labs are notable for potassium 5.3, anion gap of 21, hemoglobin 9.2.  COVID and flu are negative.  Chest x-ray showing cardiomegaly and pulmonary edema.  I have ordered the patient's morning blood pressure medicines as well as her oxycodone 15 mg that she takes for chronic pain.  Patient will likely need dialysis for volume overload [MT]  [phone] Nephrology consulted by PA - plan for medical admission, likely dialysis tomorrow [MT]     Clinical Course User Index [MT] Trifan, Kermit Balo, MD  Medical Decision Making Amount and/or Complexity of Data Reviewed Labs: ordered. Radiology: ordered.  Risk OTC drugs. Prescription drug management. Decision regarding hospitalization.   This patient presents to the ED for concern of shortness of breath, this involves an extensive number of treatment options, and is a complaint that carries with it a high risk of complications and morbidity.  The differential diagnosis includes Anxiety, Anaphylaxis/Angioedema, Aspirated FB, Arrhythmia, CHF, Asthma, COPD, PNA, COVID/Flu/RSV, STEMI, Tamponade, TPNX, Sepsis   Co morbidities that complicate the patient evaluation  lupus, lupus nephritis, ESRD on MWF dialysis, GERD, HTN, migraines, CHF with preserved EF, pulmonary edema    Additional history obtained:  Outpatient cardiologist Dr. Okey Dupre 10/2023 ECHO: 40-45% EF  Problem List / ED Course / Critical interventions / Medication management  Admitting patient for fluid overload from missed dialysis Patient presents to ED concerned for SOB, cough, diarrhea, vomiting, epigastric abdominal pain that has been progressing over the past 2 days. Last dialysis session on 3/10. Physical exam with epigastric tenderness to palpation and diffuse rales in BL lungs. Patient's vitals stable. I Ordered, and personally interpreted labs.  CBC with leukocytosis at 11.1.  There is also anemia with hemoglobin at 9.2.  Hepatic function panel reassuring.  BMP with hyperkalemia at 5.3 and CO2 low at 15 along with anion gaps at 21. There is also an elevation in BUN/CR now at 107/14.90.  Respiratory panel negative.  Lipase within normal limits. Trop 77 -> 62. The patient was maintained on a cardiac monitor.  I personally viewed and interpreted the cardiac monitored which showed an underlying rhythm of: sinus rhythm I ordered imaging studies including chest xray and CTA pelv/abd to assess  for process contributing to patient's symptoms. I independently visualized and interpreted imaging which showed possible cholecystitis - but is otherwise without acute process. I agree with the radiologist interpretation. Adding on RUQ [place] to further assess for this process.  [place] without evidence of cholecystitis Upon initial interview, provided patient with NTG ointment and lasix to help prevent further worsening of developing pulmonary edema. Patient also provided with home BP medications and her chronic pain management.  I requested consultation with the nephrologist on-call Dr. Kathrene Bongo,  and discussed lab and imaging findings as well as pertinent plan - they recommend: inpatient admission for serial HD. I appreciate their help. Dr. Manson Passey admitting provider. I have reviewed the patients home medicines and have made adjustments as needed  Social Determinants of Health:  none          Final Clinical Impression(s) / ED Diagnoses Final diagnoses:  Hypertension, unspecified type  Shortness of breath  ESRD (end stage renal disease) [facility])    Rx / DC Orders ED Discharge Orders     None         Dorthy Cooler, [place] 11/14/23 1257    Terald Sleeper, MD 11/14/23 [phone]

## 2023-11-14 NOTE — Assessment & Plan Note (Addendum)
 Has multiple factors that could be contributing to dyspnea which includes possible viral URI,Tobacco use, CHF exacebation or Volume overloaded due to missed dialysis. Suspect combination of viral URI and volume overloaded. Reassuring patient is stating appropriately on room air with good WOB.  Hoping for improved symptoms with dialysis. - Admit to FMTS, attending Dr. Manson Passey - Med Tele, Vital signs per floor - PT/OT to treat - VTE prophylaxis - heparin -Continue supplemental oxygen as needed to maintain oxygen saturation; wean as able - AM CBC, RFP, Mag -Continue home hydroxyzine 10 mg TID PRN for itching

## 2023-11-14 NOTE — Assessment & Plan Note (Addendum)
 Resolved this morning. - Can offer Tigan 200 mg q6 PRN for nausea/vomiting -- Could modify diet should pain persist.

## 2023-11-15 DIAGNOSIS — Z992 Dependence on renal dialysis: Secondary | ICD-10-CM | POA: Diagnosis not present

## 2023-11-15 DIAGNOSIS — N186 End stage renal disease: Secondary | ICD-10-CM | POA: Diagnosis not present

## 2023-11-15 LAB — CBC
HCT: 33.5 % — ABNORMAL LOW (ref 36.0–46.0)
Hemoglobin: 10.6 g/dL — ABNORMAL LOW (ref 12.0–15.0)
MCH: 30.1 pg (ref 26.0–34.0)
MCHC: 31.6 g/dL (ref 30.0–36.0)
MCV: 95.2 fL (ref 80.0–100.0)
Platelets: 182 10*3/uL (ref 150–400)
RBC: 3.52 MIL/uL — ABNORMAL LOW (ref 3.87–5.11)
RDW: 18.5 % — ABNORMAL HIGH (ref 11.5–15.5)
WBC: 9 10*3/uL (ref 4.0–10.5)
nRBC: 0 % (ref 0.0–0.2)

## 2023-11-15 LAB — RENAL FUNCTION PANEL
Albumin: 3.2 g/dL — ABNORMAL LOW (ref 3.5–5.0)
Anion gap: 14 (ref 5–15)
BUN: 41 mg/dL — ABNORMAL HIGH (ref 6–20)
CO2: 25 mmol/L (ref 22–32)
Calcium: 9.1 mg/dL (ref 8.9–10.3)
Chloride: 96 mmol/L — ABNORMAL LOW (ref 98–111)
Creatinine, Ser: 8.12 mg/dL — ABNORMAL HIGH (ref 0.44–1.00)
GFR, Estimated: 6 mL/min — ABNORMAL LOW (ref >60)
Glucose, Bld: 86 mg/dL (ref 70–99)
Phosphorus: 6.6 mg/dL — ABNORMAL HIGH (ref 2.5–4.6)
Potassium: 4.3 mmol/L (ref 3.5–5.1)
Sodium: 135 mmol/L (ref 135–145)

## 2023-11-15 LAB — URINALYSIS, ROUTINE W REFLEX MICROSCOPIC
Bacteria, UA: NONE SEEN
Bilirubin Urine: NEGATIVE
Glucose, UA: 50 mg/dL — AB
Hgb urine dipstick: NEGATIVE
Ketones, ur: NEGATIVE mg/dL
Leukocytes,Ua: NEGATIVE
Nitrite: NEGATIVE
Protein, ur: 100 mg/dL — AB
Specific Gravity, Urine: 1.006 (ref 1.005–1.030)
pH: 8 (ref 5.0–8.0)

## 2023-11-15 LAB — MAGNESIUM: Magnesium: 2.2 mg/dL (ref 1.7–2.4)

## 2023-11-15 MED ORDER — ACETAMINOPHEN 500 MG PO TABS
1000.0000 mg | ORAL_TABLET | Freq: Four times a day (QID) | ORAL | Status: DC
  Administered 2023-11-15 – 2023-11-19 (×14): 1000 mg via ORAL
  Filled 2023-11-15 (×16): qty 2

## 2023-11-15 MED ORDER — CARVEDILOL 25 MG PO TABS
25.0000 mg | ORAL_TABLET | Freq: Two times a day (BID) | ORAL | Status: DC
  Administered 2023-11-15 – 2023-11-19 (×9): 25 mg via ORAL
  Filled 2023-11-15 (×9): qty 1

## 2023-11-15 MED ORDER — HYDROMORPHONE HCL 1 MG/ML IJ SOLN
1.0000 mg | INTRAMUSCULAR | Status: DC | PRN
  Administered 2023-11-15 – 2023-11-17 (×5): 1 mg via INTRAVENOUS
  Filled 2023-11-15 (×5): qty 1

## 2023-11-15 MED ORDER — OXYCODONE HCL 5 MG PO TABS
15.0000 mg | ORAL_TABLET | ORAL | Status: DC
  Administered 2023-11-15 – 2023-11-19 (×21): 15 mg via ORAL
  Filled 2023-11-15 (×21): qty 3

## 2023-11-15 MED ORDER — AMLODIPINE BESYLATE 10 MG PO TABS
10.0000 mg | ORAL_TABLET | Freq: Every day | ORAL | Status: DC
  Administered 2023-11-15 – 2023-11-19 (×5): 10 mg via ORAL
  Filled 2023-11-15 (×5): qty 1

## 2023-11-15 MED ORDER — CHLORHEXIDINE GLUCONATE CLOTH 2 % EX PADS: 6.0000 | MEDICATED_PAD | Freq: Every day | CUTANEOUS | Status: DC

## 2023-11-15 MED ORDER — IRBESARTAN 300 MG PO TABS
300.0000 mg | ORAL_TABLET | Freq: Every day | ORAL | Status: DC
  Administered 2023-11-15 – 2023-11-19 (×5): 300 mg via ORAL
  Filled 2023-11-15 (×5): qty 1

## 2023-11-15 NOTE — Telephone Encounter (Signed)
 Hey can you please call patient and get them established with [facility] or Dr. Duke Salvia with the HTN clinic please.

## 2023-11-15 NOTE — Hospital Course (Addendum)
 Linda [name] is a 28 y.o.female with a history of lupus, ESRD who was admitted to the [facility] Medicine Teaching Service at [facility] for shortness of breath after missing 2 HD sessions.   Her hospital course is detailed below:  Dyspnea  chest pain In the setting of volume overload due to missed sessions.  In the ED found to have leukocytosis, anemia, hyperkalemia, anion gap.  Respiratory panel and troponins were negative.  Patient did have chest x-ray and CT A/P which were largely unremarkable concern for possible acute cholecystitis which was ultimately ruled out abdominal ultrasound.  Patient also received IV Lasix in the ED. she was ultimately able to complete HD hospital and her symptoms significantly improved.  ESRD Patient went to HD following admission, but was not able to complete full session. She went for additional HD session on 3/19.  Nodules Patient with multiple firm nodules across her body, suspicious for calciphylaxis, lupus panniculitis, or possible gouty tophi.  Did attempt to biopsy 1 of these lesions on her right forearm, however no tissue was able to be collected.  Nodule drained a somewhat chalky fluid.  Patient tolerated the procedure well.   Other chronic conditions were medically managed with home medications and formulary alternatives as necessary (calciphylaxis, HTN, GERD)   PCP Follow-up Recommendations: Needs follow up with Rheumatology as she has not seen them recently, is not on any therapies for lupus Hep B nonimmune; recommend offering vaccination outpatient. Needs follow-up with dermatology outpatient for further evaluation of nodules, consider biopsy/aspiration; may benefit from having these drained in the [facility] clinic.

## 2023-11-15 NOTE — Assessment & Plan Note (Addendum)
 Was able to complete partial HD session last night, but stopped early due to symptoms.  She also has difficulty sitting due to pain from calciphylaxis, as below. - Nephrology consulted, appreciate recommendations - AM labs pending nephrology recommendations

## 2023-11-15 NOTE — Evaluation (Signed)
 Occupational Therapy Evaluation Patient Details Name: Linda [name] MRN: [id number] DOB: 05-18-1996 Today's Date: 11/15/2023   History of Present Illness   Linda [name] is a 28 y.o. female presenting to ED concerned for SOB, cough, diarrhea, vomiting, epigastric abdominal pain that has been progressing over the past 2 days. Also endorses slight central chest pain. Patient stating that she has not had dialysis since 3/10. PMHx includes HTN, lupus, LVH, migraines, ESRD, GERD, septic prepatellar bursitis of R knee     Clinical Impressions Pt evaluated s/p the admission list above. At baseline, pt lives at home with grandmother and additional relatives, and completes all ADLs/IADLs and functional mobility tasks independently without use of AD. Pt reported feeling SOB prior to engaging in session. Pt O2 read 100% at rest and decreased to 93% after activity. Pt completed all aspects of functional mobility with up to supervision due to mild impairments in balance when standing and performing functional mobility without use of AD. Based on evaluation, pt will require up to supervision for ADLs. Pt will benefit from additional acute OT services for education of energy conservation strategies and to improve pt independence in functional tasks. Recommends pt discharge home with family support once medically stable without follow-up OT services.    If plan is discharge home, recommend the following:   Assistance with cooking/housework;Assist for transportation     Functional Status Assessment   Patient has had a recent decline in their functional status and demonstrates the ability to make significant improvements in function in a reasonable and predictable amount of time.     Equipment Recommendations   None recommended by OT     Recommendations for Other Services         Precautions/Restrictions   Precautions Precautions: Fall Recall of Precautions/Restrictions:  Intact Restrictions Weight Bearing Restrictions Per Provider Order: No     Mobility Bed Mobility Overal bed mobility: Modified Independent             General bed mobility comments: HOB elevated. No physical assistance required    Transfers Overall transfer level: Needs assistance   Transfers: Sit to/from Stand Sit to Stand: Supervision           General transfer comment: Supervision provided for safety. Completed without physical assistance or use of AD      Balance Overall balance assessment: Mild deficits observed, not formally tested                                         ADL either performed or assessed with clinical judgement   ADL Overall ADL's : Needs assistance/impaired Eating/Feeding: Independent   Grooming: Supervision/safety;Standing   Upper Body Bathing: Supervision/ safety;Standing   Lower Body Bathing: Supervison/ safety;Sit to/from stand   Upper Body Dressing : Supervision/safety;Sitting   Lower Body Dressing: Supervision/safety;Sit to/from stand   Toilet Transfer: Supervision/safety;Ambulation;Regular Toilet   Toileting- Architect and Hygiene: Supervision/safety;Sit to/from stand       Functional mobility during ADLs: Supervision/safety General ADL Comments: Pt with reports of SOB and observed with mild impairments in balance when performing functional mobility without use of AD. Pt O2 was 100% at rest but decreased to 93% on RA after activity     Vision Baseline Vision/History: 0 No visual deficits Ability to See in Adequate Light: 0 Adequate Patient Visual Report: No change from baseline Vision Assessment?: No apparent visual deficits  Perception Perception: Within Functional Limits       Praxis Praxis: Not tested       Pertinent Vitals/Pain Pain Assessment Pain Assessment: Faces Faces Pain Scale: Hurts little more Pain Location: headache, buttocks Pain Descriptors / Indicators:  Discomfort, Aching Pain Intervention(s): Monitored during session, Limited activity within patient's tolerance     Extremity/Trunk Assessment Upper Extremity Assessment Upper Extremity Assessment: Generalized weakness   Lower Extremity Assessment Lower Extremity Assessment: Defer to PT evaluation   Cervical / Trunk Assessment Cervical / Trunk Assessment: Normal   Communication Communication Communication: No apparent difficulties   Cognition Arousal: Alert Behavior During Therapy: Flat affect Cognition: No apparent impairments, No family/caregiver present to determine baseline             OT - Cognition Comments: Pt answered questions appropriately, able to express wants/needs                 Following commands: Intact       Cueing  General Comments   Cueing Techniques: Verbal cues  VSS on RA   Exercises     Shoulder Instructions      Home Living Family/patient expects to be discharged to:: Private residence Living Arrangements: Other relatives Available Help at Discharge: Available 24 hours/day (grandmother, uncle, aunt) Type of Home: House Home Access: Stairs to enter Secretary/administrator of Steps: 6 Entrance Stairs-Rails: Right;Left Home Layout: One level     Bathroom Shower/Tub: Producer, television/film/video: Standard     Home Equipment: Shower seat - built in;Grab bars - tub/shower;Hand held shower head   Additional Comments: Recently moved in with grandmother where aunt and uncle also resides      Prior Functioning/Environment Prior Level of Function : Independent/Modified Independent;Driving             Mobility Comments: did not use DME for functional mobility. Reports family has one car that they all use for appointments which makes it difficult for her to make all of her appointments ADLs Comments: completed all ADLs/IADLs independently    OT Problem List: Decreased activity tolerance;Decreased strength;Impaired balance  (sitting and/or standing)   OT Treatment/Interventions: Self-care/ADL training;Therapeutic exercise;Energy conservation;Therapeutic activities;Patient/family education;Balance training      OT Goals(Current goals can be found in the care plan section)   Acute Rehab OT Goals Patient Stated Goal: none stated OT Goal Formulation: With patient Time For Goal Achievement: 11/29/23 Potential to Achieve Goals: Good ADL Goals Pt Will Perform Upper Body Dressing: Independently;sitting Pt Will Perform Lower Body Dressing: Independently;sit to/from stand Pt Will Transfer to Toilet: Independently;ambulating;regular height toilet Pt Will Perform Toileting - Clothing Manipulation and hygiene: Independently;sitting/lateral leans;sit to/from stand Additional ADL Goal #1: Pt will demonstrate improved activity tolerance by completing 10 minutes of OOB functional activity without signs/symptoms of fatigue   OT Frequency:  Min 2X/week    Co-evaluation              AM-PAC OT "6 Clicks" Daily Activity     Outcome Measure Help from another person eating meals?: None Help from another person taking care of personal grooming?: A Little Help from another person toileting, which includes using toliet, bedpan, or urinal?: A Little Help from another person bathing (including washing, rinsing, drying)?: A Little Help from another person to put on and taking off regular upper body clothing?: A Little Help from another person to put on and taking off regular lower body clothing?: A Little 6 Click Score: 19   End of Session  Equipment Utilized During Treatment: Stage manager Communication: Mobility status  Activity Tolerance: Patient tolerated treatment well Patient left: in bed;with call bell/phone within reach  OT Visit Diagnosis: Unsteadiness on feet (R26.81);Other abnormalities of gait and mobility (R26.89);Muscle weakness (generalized) (M62.81)                Time: 3086-5784 OT Time Calculation  (min): 17 min Charges:  OT General Charges $OT Visit: 1 Visit OT Evaluation $OT Eval Low Complexity: 1 Low  [address], MOTS  Saranya Harlin 11/15/2023, 1:25 PM

## 2023-11-15 NOTE — Evaluation (Signed)
 Physical Therapy Brief Evaluation and Discharge Note Patient Details Name: Linda [name] MRN: [id number] DOB: November 26, 1995 Today's Date: 11/15/2023   History of Present Illness  Linda [name] is a 28 y.o. female presenting to ED concerned for SOB, cough, diarrhea, vomiting, epigastric abdominal pain that has been progressing over the past 2 days. Also endorses slight central chest pain. Patient stating that she has not had dialysis since 3/10. PMHx includes HTN, lupus, LVH, migraines, ESRD, GERD, septic prepatellar bursitis of R knee  Clinical Impression  Pt doing well with mobility and no further PT needed.  Ready for dc from PT standpoint. Will refer to mobility team to continue to encourage mobility/activity while here.         PT Assessment Patient does not need any further PT services  Assistance Needed at Discharge  PRN    Equipment Recommendations None recommended by PT  Recommendations for Other Services       Precautions/Restrictions Precautions Precautions: None Recall of Precautions/Restrictions: Intact Restrictions Weight Bearing Restrictions Per Provider Order: No        Mobility  Bed Mobility   Supine/Sidelying to sit: Modified independent (Device/Increased time) Sit to supine/sidelying: Modified independent (Device/Increased time)    Transfers Overall transfer level: Modified independent Equipment used: None Transfers: Sit to/from Stand Sit to Stand: Modified independent (Device/Increase time)                Ambulation/Gait Ambulation/Gait assistance: Modified independent (Device/Increase time) Gait Distance (Feet): 140 Feet Assistive device: None Gait Pattern/deviations: Step-through pattern, Decreased stride length Gait Speed: Below normal General Gait Details: Steady gait  Home Activity Instructions    Stairs            Modified Rankin (Stroke Patients Only)        Balance Overall balance assessment: Mild deficits  observed, not formally tested                        Pertinent Vitals/Pain   Pain Assessment Pain Assessment: 0-10 Pain Score: 9  Pain Location: headache, buttocks Pain Descriptors / Indicators: Aching Pain Intervention(s): Limited activity within patient's tolerance, Patient requesting pain meds-RN notified     Home Living Family/patient expects to be discharged to:: Private residence Living Arrangements: Other relatives (grandmother, aunt, uncle) Available Help at Discharge: Family;Available 24 hours/day Home Environment: Stairs to enter;Rail - right;Rail - left  Stairs-Number of Steps: 6 Home Equipment: Shower seat - built in;Grab bars - tub/shower;Hand held shower head   Additional Comments: Recently moved in with grandmother where aunt and uncle also resides    Prior Function Level of Independence: Independent      UE/LE Assessment   UE ROM/Strength/Tone/Coordination:  (defer to OT)    LE ROM/Strength/Tone/Coordination: [facility]      Communication   Communication Communication: No apparent difficulties     Cognition Overall Cognitive Status: Appears within functional limits for tasks assessed/performed       General Comments General comments (skin integrity, edema, etc.): VSS on RA    Exercises     Assessment/Plan    PT Problem List         PT Visit Diagnosis Other abnormalities of gait and mobility (R26.89)    No Skilled PT Patient is modified independent with all activity/mobility   Co-evaluation                AMPAC 6 Clicks Help needed turning from your back to your side while in a flat  bed without using bedrails?: None Help needed moving from lying on your back to sitting on the side of a flat bed without using bedrails?: None Help needed moving to and from a bed to a chair (including a wheelchair)?: None Help needed standing up from a chair using your arms (e.g., wheelchair or bedside chair)?: None Help needed to walk in  hospital room?: None Help needed climbing 3-5 steps with a railing? : None 6 Click Score: 24      End of Session   Activity Tolerance: Patient tolerated treatment well Patient left: in bed;with call bell/phone within reach   PT Visit Diagnosis: Other abnormalities of gait and mobility (R26.89)     Time: 4540-9811 PT Time Calculation (min) (ACUTE ONLY): 8 min  Charges:   PT Evaluation $PT Eval Low Complexity: 1 Low      [facility] PT Acute Rehabilitation Services Office [phone]   Angelina Ok [facility]  11/15/2023, 1:30 PM

## 2023-11-15 NOTE — Assessment & Plan Note (Addendum)
 Lupus-has not seen rheumatologist since last year, not on any therapy currently. HTN-continue home amlodipine 10 mg daily, Coreg 25 mg BID, irbesartan 300 mg daily Calciphylaxis-patient with nodules across her body, however has 2 very large nodules on her bottom that make it very difficult for her to sit.  This does interfere with hemodialysis.  Needs good pain control.  Will try scheduled Tylenol 1000 mg q6, scheduled oxycodone 15 mg q4, and add 1 mg Dilaudid q4 Prn for breakthrough pain.  GERD-continue home Protonix 40 mg daily

## 2023-11-15 NOTE — TOC CM/SW Note (Addendum)
 Transition of Care [facility]) - Inpatient Brief Assessment   Patient Details  Name: Linda [name] MRN: [id number] Date of Birth: 08/12/1996  [facility] CM/SW Contact:    Tom-Johnson, Hershal Coria, RN Phone Number: 11/15/2023, 2:54 PM   Clinical Narrative:  Patient presented to the ED with Shortness Of Breath, Cough, Diarrhea, Abdominal pain and low back pain. Patient states she missed HD sessions, admitted with ESRD. Nephrology following for inpatient HD.   Patient states she recently moved in with her grandmother a week ago d/t her being evicted from her apartment. States she cannot afford to pay her rent and other bills with her disability check. States her grandmother told her she cannot stay there for  a long time.  CM called and left a secure voicemail to [place] with [facility] [phone]) for assistance with resources. CM called and spoke with Rodney Langton with [facility] [phone]) and she referred CM to [facility] for low income housing resources. Awaiting her response.   Patient does not have children and both parents deceased. Uses [employer] transportation to and from outpatient HD and Grandmother transports sometimes.  PCP is Reubin Milan, PA-C and uses [facility] on [address]   Patient not Medically ready for discharge.  CM will continue to follow as patient progresses with care towards discharge.      Transition of Care Asessment: Insurance and Status: Insurance coverage has been reviewed Patient has primary care physician: Yes Home environment has been reviewed: Yes Prior level of function:: Independent Prior/Current Home Services: No current home services Social Drivers of Health Review: SDOH reviewed no interventions necessary Readmission risk has been reviewed: Yes Transition of care needs: transition of care needs identified, TOC will continue to  follow

## 2023-11-15 NOTE — Assessment & Plan Note (Addendum)
 Most recent echo last month with EF of 40-45%.  This morning stable on exam, no JVD or LE edema. -Continuous cardiac monitoring

## 2023-11-15 NOTE — Assessment & Plan Note (Addendum)
 Somewhat improved this morning, although patient continues to be short of breath at times.  Anticipate this will continue to improve as patient is able to complete HD sessions. -Pulse ox, initiate supplemental oxygen if cannot maintain saturations

## 2023-11-15 NOTE — Progress Notes (Addendum)
 [facility] Progress Note   Subjective:    Seen and examined patient at bedside. Recived HD overnight and 2.8L removed. Appears to be on RA. She informed me of a nodule on her buttocks. Next HD 3/18.   Objective Vitals:   11/15/23 0219 11/15/23 0300 11/15/23 0522 11/15/23 0749  BP: (!) 177/124 (!) 171/117 (!) 150/107 (!) 137/109  Pulse: 81 84 77 80  Resp: 18  18 19   Temp: (!) 97.5 F (36.4 C)  97.6 F (36.4 C)   TempSrc: Oral     SpO2: 100%  98% 94%  Weight:      Height:       Physical Exam General: Young woman, chronically ill-appearing, on RA, NAD  Heart:S1 and S2; No murmurs, gallops, or rubs Lungs:Clear throughout; No rales, wheezes, or rhonchi Abdomen: Soft and non-tender Extremities: No LE edema Skin: Noted noted at buttocks-no drainage/skin breakdown present Dialysis Access: L AVG (+) B/T   Filed Weights   11/14/23 0624  Weight: 72.6 kg    Intake/Output Summary (Last 24 hours) at 11/15/2023 1350 Last data filed at 11/15/2023 0522 Gross per 24 hour  Intake --  Output 2800 ml  Net -2800 ml    Additional Objective Labs: Basic Metabolic Panel: Recent Labs  Lab 11/14/23 0638 11/14/23 1530 11/15/23 0745  NA 138  --  135  K 5.3*  --  4.3  CL 102  --  96*  CO2 15*  --  25  GLUCOSE 79  --  86  BUN 107*  --  41*  CREATININE 14.90* 15.92* 8.12*  CALCIUM 8.8*  --  9.1  PHOS  --   --  6.6*   Liver Function Tests: Recent Labs  Lab 11/14/23 0849 11/15/23 0745  AST 34  --   ALT 10  --   ALKPHOS 61  --   BILITOT 1.1  --   PROT 6.8  --   ALBUMIN 3.1* 3.2*   Recent Labs  Lab 11/14/23 0849  LIPASE 29   CBC: Recent Labs  Lab 11/14/23 0638 11/14/23 1530 11/15/23 0745  WBC 11.1* 8.2 9.0  NEUTROABS 6.3  --   --   HGB 9.2* 9.2* 10.6*  HCT 29.8* 29.0* 33.5*  MCV 98.3 97.0 95.2  PLT 184 174 182   Blood Culture    Component Value Date/Time   SDES BLOOD BLOOD RIGHT ARM 10/10/2023 0206   SPECREQUEST  10/10/2023 0206    BOTTLES  DRAWN AEROBIC AND ANAEROBIC Blood Culture results may not be optimal due to an inadequate volume of blood received in culture bottles   CULT  10/10/2023 0206    NO GROWTH 5 DAYS Performed at [facility], [address], [place], [place] [address]    REPTSTATUS 10/15/2023 FINAL 10/10/2023 0206    Cardiac Enzymes: No results for input(s): "CKTOTAL", "CKMB", "CKMBINDEX", "TROPONINI" in the last 168 hours. CBG: No results for input(s): "GLUCAP" in the last 168 hours. Iron Studies: No results for input(s): "IRON", "TIBC", "TRANSFERRIN", "FERRITIN" in the last 72 hours. Lab Results  Component Value Date   INR 1.2 10/10/2023   INR 1.0 03/27/2019   INR 1.01 01/25/2018   Studies/Results: US Abdomen Limited RUQ (LIVER/GB) Result Date: 11/14/2023 CLINICAL DATA:  151471 RUQ pain 151471 EXAM: ULTRASOUND ABDOMEN LIMITED RIGHT UPPER QUADRANT COMPARISON:  Same-day x-ray FINDINGS: Gallbladder: No gallstones or wall thickening visualized. No sonographic Murphy sign noted by sonographer. Common bile duct: Diameter: 6 mm. Liver: Hepatomegaly. No focal lesion  identified. Diffusely increased hepatic parenchymal echogenicity. Portal vein is patent on color Doppler imaging with normal direction of blood flow towards the liver. Other: Echogenic appearance of the right renal parenchyma. IMPRESSION: 1. No sonographic evidence of acute cholecystitis. 2. Hepatomegaly with diffuse hepatic steatosis. 3. Echogenic appearance of the right renal parenchyma, which can be seen in the setting of medical renal disease. Electronically Signed   By: Duanne Guess D.O.   On: 11/14/2023 12:23   CT ABDOMEN PELVIS WO CONTRAST Result Date: 11/14/2023 CLINICAL DATA:  Abdominal pain. EXAM: CT ABDOMEN AND PELVIS WITHOUT CONTRAST TECHNIQUE: Multidetector CT imaging of the abdomen and pelvis was performed following the standard protocol without IV contrast. RADIATION DOSE REDUCTION: This exam was performed according to the  departmental dose-optimization program which includes automated exposure control, adjustment of the mA and/or kV according to patient size and/or use of iterative reconstruction technique. COMPARISON:  09/01/2023. FINDINGS: Lower chest: Stable cardiomegaly. Mild dependent lung base atelectasis. Mild interstitial thickening. No overt pulmonary edema or convincing acute finding. Hepatobiliary: Enlarged liver, 23 cm superior to inferior, stable. Normal liver attenuation. No mass. Gallbladder wall is not well-defined, but may be mildly thickened. There is hazy inflammatory type change in the pericholecystic fat. No defined gallstone, however. No bile duct dilation. Pancreas: Unremarkable. No pancreatic ductal dilatation or surrounding inflammatory changes. Spleen: Normal in size without focal abnormality. Adrenals/Urinary Tract: Normal adrenal glands. Bilateral renal cortical thinning. No defined renal mass, no stone and no hydronephrosis. Ureters normal in course and in caliber. Bladder unremarkable. Stomach/Bowel: Stomach is unremarkable. Small bowel and colon are normal in caliber. No wall thickening. No inflammation. No evidence of appendicitis. Vascular/Lymphatic: Mild aortic atherosclerosis. No aneurysm. No enlarged lymph node. Reproductive: Uterus and bilateral adnexa are unremarkable. Other: Small amount of ascites collects in the posterior pelvic recess. Musculoskeletal: No acute fracture. Chronic bilateral pars defects at L5-S1 with a grade 1 anterolisthesis. No bone lesion. IMPRESSION: 1. Possible acute cholecystitis. No defined gallstone. No bile duct dilation. Recommend ultrasound for further assessment if symptoms suggest acute cholecystitis. 2. No other evidence of an acute abnormality within the abdomen or pelvis. 3. Stable hepatomegaly. 4. Aortic atherosclerosis. 5. Small amount of nonspecific ascites/free fluid in the posterior pelvic recess. Electronically Signed   By: Amie Portland M.D.   On:  11/14/2023 11:18   DG Chest Portable 1 View Result Date: 11/14/2023 CLINICAL DATA:  Shortness of breath. EXAM: PORTABLE CHEST 1 VIEW COMPARISON:  10/24/2023. FINDINGS: Cardiac enlargement. No pleural fluid, interstitial edema, or airspace consolidation. Visualized osseous structures are unremarkable. IMPRESSION: Cardiac enlargement. No acute findings. Electronically Signed   By: Signa Kell M.D.   On: 11/14/2023 07:13    Medications:   acetaminophen  1,000 mg Oral Q6H   amLODipine  10 mg Oral Daily   carvedilol  25 mg Oral BID WC   Chlorhexidine Gluconate Cloth  6 each Topical Q0600   heparin  5,000 Units Subcutaneous Q8H   irbesartan  300 mg Oral Daily   oxyCODONE  15 mg Oral Q4H    Dialysis Orders: [facility]  on MWF . 180NRe 3hr BFR 350 DFR 500 EDW 71.5kg 2K 2Ca  AVG 15g  No heparin Mircera IV q 2 weeks- last dose 11/01/23 Hectorol IV q HD Sensipar 180mg  PO q HD  Assessment/Plan: ESRD:  Patient missed dialysis x2 last week. She does report shortness of breath but respiratory status is stable and CXR unremarkable. Having some possible uremic symptoms (nausea) but improved  at present. Received HD overnight. Next HD 3/18 off schedule. Plan to transition back to MWF last this week. Noted HCG level ordered today. Chest pain: may be related to HTN, resolved at present. Mgt per admitting team Hypertension/volume: BP elevated, resumed home meds and UF with HD tomorrow Anemia: Hgb 9.2, not due for ESA yet Metabolic bone disease: Calcium controlled. Continue hectorol, sensipar and phos binders Nodule at buttocks: Patient informed me of a nodule on her buttocks and concerned areas is becoming larger. Examined: no drainage or skin breakdown. Doesn't appear to be calciphylaxis but maybe a possible abscess. Remains afebrile. Discussed with primary team.  Nutrition:  Will need renal diet/fluid restrictions  Salome Holmes, NP [facility] 11/15/2023,1:50  PM  LOS: 1 day

## 2023-11-15 NOTE — Progress Notes (Signed)
Pt receives out-pt HD at [facility] [place] on MWF. Will assist as needed.   Olivia Canter Renal Navigator [phone]

## 2023-11-15 NOTE — Progress Notes (Signed)
     Daily Progress Note Intern Pager: [phone]  Patient name: Linda [name] Medical record number: [id number] Date of birth: [birth year] Age: 28 y.o. Gender: female  Primary Care Provider: Pcp, No Consultants: Nephrology Code Status: Full  Pt Overview and Major Events to Date:  3/16-admitted; partial HD session  Assessment and Plan: Linda [name] is a 28 year old female who presented with increasing shortness of breath after missing 2 HD sessions.  Somewhat improved this morning, but would benefit from further dialysis inpatient.  Assessment & Plan Dyspnea Somewhat improved this morning, although patient continues to be short of breath at times.  Anticipate this will continue to improve as patient is able to complete HD sessions. -Pulse ox, initiate supplemental oxygen if cannot maintain saturations ESRD (end stage renal disease) on dialysis [facility]) Was able to complete partial HD session last night, but stopped early due to symptoms.  She also has difficulty sitting due to pain from calciphylaxis, as below. - Nephrology consulted, appreciate recommendations - AM labs pending nephrology recommendations Abdominal pain Resolved this morning. - Can offer Tigan 200 mg q6 PRN for nausea/vomiting -- Could modify diet should pain persist. Chronic heart failure with preserved ejection fraction (HFpEF) (HCC) Most recent echo last month with EF of 40-45%.  This morning stable on exam, no JVD or LE edema. -Continuous cardiac monitoring Chronic health problem Lupus-has not seen rheumatologist since last year, not on any therapy currently. HTN-continue home amlodipine 10 mg daily, Coreg 25 mg BID, irbesartan 300 mg daily Calciphylaxis-patient with nodules across her body, however has 2 very large nodules on her bottom that make it very difficult for her to sit.  This does interfere with hemodialysis.  Needs good pain control.  Will try scheduled Tylenol 1000 mg q6, scheduled oxycodone 15 mg  q4, and add 1 mg Dilaudid q4 Prn for breakthrough pain.  GERD-continue home Protonix 40 mg daily    FEN/GI: Renal diet, 1200 mL fluid restriction PPx: Heparin Dispo:Home pending clinical improvement . Barriers include further dialysis.   Subjective:  Patient seen this morning lying on her side in bed,.  Comfortable.  She reports her breathing is better.  Denies abdominal pain.  Her worst pain is from her calciphylaxis nodules on her bottom.  She could not sit for a full session of HD yesterday due to this.  Objective: Temp:  [97.2 F (36.2 C)-98.4 F (36.9 C)] 97.6 F (36.4 C) (03/17 0522) Pulse Rate:  [69-100] 80 (03/17 0749) Resp:  [13-26] 19 (03/17 0749) BP: (130-179)/(96-146) 137/109 (03/17 0749) SpO2:  [94 %-100 %] 94 % (03/17 0749) Physical Exam: General: Uncomfortable appearing, lying on side in bed, NAD Cardiovascular: RRR, no murmurs Respiratory: CTA bilaterally, no increased work of breathing on room air Abdomen: Normoactive bowel sounds, soft, nontender Extremities: No LE edema Large areas of calciphylaxis (7-8cm) on patient's bottom, tender to palpation, without open ulcers or lesions.  Laboratory: Most recent CBC Lab Results  Component Value Date   WBC 8.2 11/14/2023   HGB 9.2 (L) 11/14/2023   HCT 29.0 (L) 11/14/2023   MCV 97.0 11/14/2023   PLT 174 11/14/2023   Most recent BMP    Latest Ref Rng & Units 11/14/2023    3:30 PM  BMP  Creatinine 0.44 - 1.00 mg/dL 13.08    Magnesium 2.2  Cyndia Skeeters, DO 11/15/2023, 7:52 AM  PGY-1, [facility] FPTS Intern pager: [phone], text pages welcome Secure chat group [facility] Teaching Service

## 2023-11-15 NOTE — Care Plan (Signed)
 Patient unable to tolerate full HD session per RN, Fabian Sharp. Had 2.1 L fluid removed.  Reviewed blood pressures- continuing to rise despite HD. Most recently 172/114. Vitals:   11/14/23 2056 11/14/23 2147 11/14/23 2202 11/14/23 2232  BP: (!) 130/103 (!) 149/110 (!) 146/105 (!) 150/107   11/14/23 2302 11/14/23 2332 11/15/23 0002 11/15/23 0032  BP: (!) 145/103 (!) 141/102 (!) 149/102 (!) 149/96   11/15/23 0102 11/15/23 0132 11/15/23 0219 11/15/23 0300  BP: (!) 141/110 (!) 151/101 (!) 177/124 (!) 171/117  Reassuringly, no tachycardia or tachypnea.  Dr. Rexene Alberts going to bedside to evaluate patient. Orders changed to administer home Amlodipine 10 mg daily and Carvedilol 25 mg BID now. RN advised, asked to administer now. Unfortunately, patient had headache and self-removed nitroglycerin patch.  Will continue to monitor.   Darral Dash, DO

## 2023-11-15 NOTE — Telephone Encounter (Signed)
 Called patient, scheduled for 4/3 at 1445 with Gillian Shields, NP as HTN Consult.

## 2023-11-16 DIAGNOSIS — N186 End stage renal disease: Secondary | ICD-10-CM | POA: Diagnosis not present

## 2023-11-16 DIAGNOSIS — L989 Disorder of the skin and subcutaneous tissue, unspecified: Secondary | ICD-10-CM

## 2023-11-16 LAB — HEPATITIS B SURFACE ANTIBODY, QUANTITATIVE: Hep B S AB Quant (Post): 6.2 m[IU]/mL — ABNORMAL LOW

## 2023-11-16 MED ORDER — ONDANSETRON 4 MG PO TBDP: 4.0000 mg | ORAL_TABLET | Freq: Once | ORAL | Status: DC

## 2023-11-16 MED ORDER — ONDANSETRON 4 MG PO TBDP
4.0000 mg | ORAL_TABLET | Freq: Once | ORAL | Status: AC
  Administered 2023-11-16: 4 mg via ORAL
  Filled 2023-11-16: qty 1

## 2023-11-16 NOTE — Assessment & Plan Note (Addendum)
 Numerous raised, hard lesions across patient's body, as well as to larger lesions to the buttocks bilaterally which are firm.  All very tender.  Initially suspicious for calciphylaxis, however given patient is not currently on treatment for lupus and has distant follow-up with hematology this may be a manifestation of her lupus; consider lupus panniculitis. - Plan to biopsy 1 of these lesions today if patient is agreeable - Consider dermatology referral outpatient

## 2023-11-16 NOTE — Assessment & Plan Note (Addendum)
 Recurring this morning with episode of vomiting.  Generalized discomfort, urinary symptoms.  Has had regular bowel movements. - Can offer Tigan 200 mg q6 PRN for nausea/vomiting -- Could modify diet should pain persist.

## 2023-11-16 NOTE — Progress Notes (Signed)
 Daily Progress Note Intern Pager: [phone]  Patient name: Linda [name] Medical record number: [id number] Date of birth: [birth year] Age: 28 y.o. Gender: female  Primary Care Provider: Reubin Milan, PA-C Consultants: Nephrology Code Status: Full  Pt Overview and Major Events to Date:  3/16-admitted; partial HD session 3/18-plan for HD today  Assessment and Plan: Celese Banner is a 28 year old female with ESRD continuing HD inpatient; dyspnea continues to improve. Assessment & Plan Dyspnea Continues to improve this morning, the patient does note that sometimes she experiences shortness of breath even when she is at home.  Continuing to saturate high 90s via [place]. Anticipate this will continue to improve as patient is able to complete HD sessions. -Pulse ox, wean oxygen as able ESRD (end stage renal disease) on dialysis (HCC) Anticipate HD session today.  Will get daily labs during dialysis. - Nephrology consulted, appreciate recommendations Abdominal pain Recurring this morning with episode of vomiting.  Generalized discomfort, urinary symptoms.  Has had regular bowel movements. - Can offer Tigan 200 mg q6 PRN for nausea/vomiting -- Could modify diet should pain persist. Chronic heart failure with preserved ejection fraction (HFpEF) (HCC) Most recent echo last month with EF of 40-45%.  This morning stable on exam, no JVD or LE edema. -Continuous cardiac monitoring Skin lesions Numerous raised, hard lesions across patient's body, as well as to larger lesions to the buttocks bilaterally which are firm.  All very tender.  Initially suspicious for calciphylaxis, however given patient is not currently on treatment for lupus and has distant follow-up with hematology this may be a manifestation of her lupus; consider lupus panniculitis. - Plan to biopsy 1 of these lesions today if patient is agreeable - Consider dermatology referral outpatient Chronic health problem Lupus-has  not seen rheumatologist since last year, not on any therapy currently. HTN-continue home amlodipine 10 mg daily, Coreg 25 mg BID, irbesartan 300 mg daily Calciphylaxis-pain was better controlled yesterday with new regimen: scheduled Tylenol 1000 mg q6, scheduled oxycodone 15 mg q4, and add 1 mg Dilaudid q4 PRN for breakthrough pain.  Would like to wean patient down on the list, however will not make any changes at this time so that she may hopefully sit for a full dialysis session today. GERD-continue home Protonix 40 mg daily   FEN/GI: Renal diet, 1200 mL fluid restriction PPx: Heparin Dispo:Home pending clinical improvement . Barriers include further dialysis.   Subjective:  Patient seen this morning lying in bed.  She reports recurring abdominal pain with an episode of vomiting earlier.  Denies urinary symptoms.  Would like management of the painful nodules she has across her body.  Objective: Temp:  [97.6 F (36.4 C)-98.7 F (37.1 C)] 97.6 F (36.4 C) (03/18 0921) Pulse Rate:  [65-75] 75 (03/18 0921) Resp:  [17-18] 18 (03/18 0921) BP: (131-142)/(97-111) 140/111 (03/18 0921) SpO2:  [97 %-100 %] 100 % (03/18 0921) Physical Exam: General: Chronically ill-appearing, lying in bed, NAD Cardiovascular: RRR, no murmurs Respiratory: CTA bilaterally, normal work of breathing on 1L via [place] Abdomen: Normoactive bowel sounds, soft, mildly tender in all quadrants, no hepatosplenomegaly masses. Extremities: Moves all equally. Does have numerous nodules across her body, hard, and varying in size.  These are painful.  Also has 2 areas on buttocks (see photos below) which are firm, without fluctuance, without heat, though very tender to palpation.  L ischium   R buttock   Laboratory: Most recent CBC Lab Results  Component Value Date   WBC 9.0  11/15/2023   HGB 10.6 (L) 11/15/2023   HCT 33.5 (L) 11/15/2023   MCV 95.2 11/15/2023   PLT 182 11/15/2023   Most recent BMP    Latest Ref Rng &  Units 11/15/2023    7:45 AM  BMP  Glucose 70 - 99 mg/dL 86   BUN 6 - 20 mg/dL 41   Creatinine 7.82 - 1.00 mg/dL 9.56   Sodium 213 - 086 mmol/L 135   Potassium 3.5 - 5.1 mmol/L 4.3   Chloride 98 - 111 mmol/L 96   CO2 22 - 32 mmol/L 25   Calcium 8.9 - 10.3 mg/dL 9.1      Cyndia Skeeters, DO 11/16/2023, 11:56 AM  PGY-1, [facility] FPTS Intern pager: [phone], text pages welcome Secure chat group [facility] Teaching Service

## 2023-11-16 NOTE — Assessment & Plan Note (Addendum)
 Most recent echo last month with EF of 40-45%.  This morning stable on exam, no JVD or LE edema. -Continuous cardiac monitoring

## 2023-11-16 NOTE — Assessment & Plan Note (Addendum)
 Continues to improve this morning, the patient does note that sometimes she experiences shortness of breath even when she is at home.  Continuing to saturate high 90s via [place]. Anticipate this will continue to improve as patient is able to complete HD sessions. -Pulse ox, wean oxygen as able

## 2023-11-16 NOTE — Progress Notes (Signed)
 [facility] Progress Note   Subjective:    Seen and examined patient at bedside. She still reports occasional episodes of feeling SOB. On 2L O2. Plan for HD this afternoon. Will also repeat another CXR. Per Primary, plan for skin biopsy to evaluate lumps within buttocks and bilateral upper extremities.  Objective Vitals:   11/15/23 2013 11/16/23 0506 11/16/23 0545 11/16/23 0921  BP: (!) 131/97  (!) 142/109 (!) 140/111  Pulse: 72  74 75  Resp: 17  18 18   Temp: 97.6 F (36.4 C)  98.7 F (37.1 C) 97.6 F (36.4 C)  TempSrc: Oral   Oral  SpO2: 97% 97% 100% 100%  Weight:      Height:       Physical Exam General: Young woman, chronically ill-appearing, on 2L, NAD  Heart:S1 and S2; No murmurs, gallops, or rubs Lungs:Clear throughout; No rales, wheezes, or rhonchi Abdomen: Soft and non-tender Extremities: No LE edema Skin: Multiple lumps within bilateral upper extremities and buttocks Dialysis Access: L AVG (+) B/T   Filed Weights   11/14/23 0624  Weight: 72.6 kg    Intake/Output Summary (Last 24 hours) at 11/16/2023 1234 Last data filed at 11/16/2023 0300 Gross per 24 hour  Intake 300 ml  Output 1200 ml  Net -900 ml    Additional Objective Labs: Basic Metabolic Panel: Recent Labs  Lab 11/14/23 0638 11/14/23 1530 11/15/23 0745  NA 138  --  135  K 5.3*  --  4.3  CL 102  --  96*  CO2 15*  --  25  GLUCOSE 79  --  86  BUN 107*  --  41*  CREATININE 14.90* 15.92* 8.12*  CALCIUM 8.8*  --  9.1  PHOS  --   --  6.6*   Liver Function Tests: Recent Labs  Lab 11/14/23 0849 11/15/23 0745  AST 34  --   ALT 10  --   ALKPHOS 61  --   BILITOT 1.1  --   PROT 6.8  --   ALBUMIN 3.1* 3.2*   Recent Labs  Lab 11/14/23 0849  LIPASE 29   CBC: Recent Labs  Lab 11/14/23 0638 11/14/23 1530 11/15/23 0745  WBC 11.1* 8.2 9.0  NEUTROABS 6.3  --   --   HGB 9.2* 9.2* 10.6*  HCT 29.8* 29.0* 33.5*  MCV 98.3 97.0 95.2  PLT 184 174 182   Blood Culture     Component Value Date/Time   SDES BLOOD BLOOD RIGHT ARM 10/10/2023 0206   SPECREQUEST  10/10/2023 0206    BOTTLES DRAWN AEROBIC AND ANAEROBIC Blood Culture results may not be optimal due to an inadequate volume of blood received in culture bottles   CULT  10/10/2023 0206    NO GROWTH 5 DAYS Performed at [facility], [address] [place], [place] [address]    REPTSTATUS 10/15/2023 FINAL 10/10/2023 0206    Cardiac Enzymes: No results for input(s): "CKTOTAL", "CKMB", "CKMBINDEX", "TROPONINI" in the last 168 hours. CBG: No results for input(s): "GLUCAP" in the last 168 hours. Iron Studies: No results for input(s): "IRON", "TIBC", "TRANSFERRIN", "FERRITIN" in the last 72 hours. Lab Results  Component Value Date   INR 1.2 10/10/2023   INR 1.0 03/27/2019   INR 1.01 01/25/2018   Studies/Results: No results found.  Medications:   acetaminophen  1,000 mg Oral Q6H   amLODipine  10 mg Oral Daily   carvedilol  25 mg Oral BID WC   Chlorhexidine Gluconate Cloth  6  each Topical Q0600   heparin  5,000 Units Subcutaneous Q8H   irbesartan  300 mg Oral Daily   oxyCODONE  15 mg Oral Q4H    Dialysis Orders: [place]  on MWF . 180NRe 3hr BFR 350 DFR 500 EDW 71.5kg 2K 2Ca  AVG 15g  No heparin Mircera IV q 2 weeks- last dose 11/01/23 Hectorol IV q HD Sensipar 180mg  PO q HD  Assessment/Plan: ESRD:  Patient missed dialysis x2 last week. She does report shortness of breath but respiratory status is stable and CXR unremarkable. Having some possible uremic symptoms (nausea) but improved at present. Received HD 3/16. Next HD this afternoon. Plan to transition back to MWF last this week. Noted HCG level ordered yesterday. Chest pain: may be related to HTN, resolved at present. Mgt per admitting team Hypertension/volume: home meds resumed and Bps coming down with UF. She still reports occasional SOB. Will repeat CXR in AM. Anemia: Hgb 9.2, not due for ESA yet Metabolic  bone disease: Calcium controlled. Continue hectorol, sensipar and phos binders Multiple lumps BUEs/Buttocks: No drainage or skin breakdown present. Doesn't appear to be calciphylaxis but maybe a possible abscess vs. Hidradenitis vs. Tophaceous gout. Remains afebrile. Discussed with primary team. Plan for skin biopsy if patient agrees Nutrition:  Will need renal diet/fluid restrictions  Linda Holmes, NP [facility] 11/16/2023,12:34 PM  LOS: 2 days

## 2023-11-16 NOTE — Assessment & Plan Note (Addendum)
 Anticipate HD session today.  Will get daily labs during dialysis. - Nephrology consulted, appreciate recommendations

## 2023-11-16 NOTE — Progress Notes (Signed)
 Mobility Specialist Progress Note:    11/16/23 0921  Therapy Vitals  Temp 97.6 F (36.4 C)  Temp Source Oral  Pulse Rate 75  Resp 18  BP (!) 140/111  Patient Position (if appropriate) Sitting  Oxygen Therapy  SpO2 100 %  O2 Device Nasal Cannula  O2 Flow Rate (L/min) 2 L/min  Mobility  Activity Ambulated independently in hallway  Level of Assistance Standby assist, set-up cues, supervision of patient - no hands on  Assistive Device None  Distance Ambulated (ft) 200 ft  Activity Response Tolerated well  Mobility Referral Yes  Mobility visit 1 Mobility  Mobility Specialist Start Time (ACUTE ONLY) [id number]  Mobility Specialist Stop Time (ACUTE ONLY) 0949  Mobility Specialist Time Calculation (min) (ACUTE ONLY) 11 min   Pt received in bed and agreeable. [place] doffed upon MS arrival. Donned [place] and ambulated on 2L O2. VSS throughout. Pt a little wobbly but no overt LOB. No complaints throughout. Left in bed with call bell and all needs met.  D'Vante Earlene Plater Mobility Specialist Please contact via Special educational needs teacher or Rehab office at [phone]

## 2023-11-16 NOTE — Assessment & Plan Note (Addendum)
 Lupus-has not seen rheumatologist since last year, not on any therapy currently. HTN-continue home amlodipine 10 mg daily, Coreg 25 mg BID, irbesartan 300 mg daily Calciphylaxis-pain was better controlled yesterday with new regimen: scheduled Tylenol 1000 mg q6, scheduled oxycodone 15 mg q4, and add 1 mg Dilaudid q4 PRN for breakthrough pain.  Would like to wean patient down on the list, however will not make any changes at this time so that she may hopefully sit for a full dialysis session today. GERD-continue home Protonix 40 mg daily

## 2023-11-17 ENCOUNTER — Inpatient Hospital Stay (HOSPITAL_COMMUNITY)

## 2023-11-17 DIAGNOSIS — N186 End stage renal disease: Secondary | ICD-10-CM | POA: Diagnosis not present

## 2023-11-17 LAB — CBC WITH DIFFERENTIAL/PLATELET
Abs Immature Granulocytes: 0.05 10*3/uL (ref 0.00–0.07)
Basophils Absolute: 0 10*3/uL (ref 0.0–0.1)
Basophils Relative: 0 %
Eosinophils Absolute: 0.8 10*3/uL — ABNORMAL HIGH (ref 0.0–0.5)
Eosinophils Relative: 8 %
HCT: 31.1 % — ABNORMAL LOW (ref 36.0–46.0)
Hemoglobin: 9.7 g/dL — ABNORMAL LOW (ref 12.0–15.0)
Immature Granulocytes: 1 %
Lymphocytes Relative: 20 %
Lymphs Abs: 1.9 10*3/uL (ref 0.7–4.0)
MCH: 30.3 pg (ref 26.0–34.0)
MCHC: 31.2 g/dL (ref 30.0–36.0)
MCV: 97.2 fL (ref 80.0–100.0)
Monocytes Absolute: 0.8 10*3/uL (ref 0.1–1.0)
Monocytes Relative: 8 %
Neutro Abs: 6.1 10*3/uL (ref 1.7–7.7)
Neutrophils Relative %: 63 %
Platelets: 225 10*3/uL (ref 150–400)
RBC: 3.2 MIL/uL — ABNORMAL LOW (ref 3.87–5.11)
RDW: 17.7 % — ABNORMAL HIGH (ref 11.5–15.5)
WBC: 9.7 10*3/uL (ref 4.0–10.5)
nRBC: 0 % (ref 0.0–0.2)

## 2023-11-17 LAB — RENAL FUNCTION PANEL
Albumin: 2.9 g/dL — ABNORMAL LOW (ref 3.5–5.0)
Anion gap: 15 (ref 5–15)
BUN: 65 mg/dL — ABNORMAL HIGH (ref 6–20)
CO2: 21 mmol/L — ABNORMAL LOW (ref 22–32)
Calcium: 8.4 mg/dL — ABNORMAL LOW (ref 8.9–10.3)
Chloride: 93 mmol/L — ABNORMAL LOW (ref 98–111)
Creatinine, Ser: 11.22 mg/dL — ABNORMAL HIGH (ref 0.44–1.00)
GFR, Estimated: 4 mL/min — ABNORMAL LOW (ref >60)
Glucose, Bld: 94 mg/dL (ref 70–99)
Phosphorus: 9.5 mg/dL — ABNORMAL HIGH (ref 2.5–4.6)
Potassium: 4.4 mmol/L (ref 3.5–5.1)
Sodium: 129 mmol/L — ABNORMAL LOW (ref 135–145)

## 2023-11-17 LAB — HCG, SERUM, QUALITATIVE: Preg, Serum: NEGATIVE

## 2023-11-17 MED ORDER — LIDOCAINE HCL (PF) 1 % IJ SOLN: 5.0000 mL | INTRAMUSCULAR | Status: DC | PRN

## 2023-11-17 MED ORDER — HEPARIN SODIUM (PORCINE) 1000 UNIT/ML DIALYSIS: 1000.0000 [IU] | INTRAMUSCULAR | Status: DC | PRN

## 2023-11-17 MED ORDER — FUROSEMIDE 40 MG PO TABS
80.0000 mg | ORAL_TABLET | Freq: Three times a day (TID) | ORAL | Status: DC
  Administered 2023-11-17 – 2023-11-19 (×6): 80 mg via ORAL
  Filled 2023-11-17 (×6): qty 2

## 2023-11-17 MED ORDER — CHLORHEXIDINE GLUCONATE CLOTH 2 % EX PADS: 6.0000 | MEDICATED_PAD | Freq: Every day | CUTANEOUS | Status: DC

## 2023-11-17 MED ORDER — ALTEPLASE 2 MG IJ SOLR: 2.0000 mg | Freq: Once | INTRAMUSCULAR | Status: DC | PRN

## 2023-11-17 MED ORDER — LIDOCAINE-PRILOCAINE 2.5-2.5 % EX CREA: 1.0000 | TOPICAL_CREAM | CUTANEOUS | Status: DC | PRN

## 2023-11-17 MED ORDER — DARBEPOETIN ALFA 100 MCG/0.5ML IJ SOSY
100.0000 ug | PREFILLED_SYRINGE | INTRAMUSCULAR | Status: DC
  Administered 2023-11-18: 100 ug via SUBCUTANEOUS
  Filled 2023-11-17: qty 0.5

## 2023-11-17 MED ORDER — ANTICOAGULANT SODIUM CITRATE 4% (200MG/5ML) IV SOLN: 5.0000 mL | Status: DC | PRN

## 2023-11-17 NOTE — Assessment & Plan Note (Addendum)
 Attempted biopsy of lesion on the right yesterday 3/18, however this was unsuccessful.  Lesion was ultimately liquid chalky consistency which drained freely.  No tissue could be collected for sampling.  Patient tolerated procedure well.  Consider lupus panniculitis versus gouty tophi. - Plan to biopsy 1 of these lesions today if patient is agreeable - Consider dermatology referral outpatient

## 2023-11-17 NOTE — Procedures (Signed)
 Pre-op Diagnosis: nodule Post-op Diagnosis: Same Procedure: Punch Skin Biopsy Location: Right posterior forearm Performing Physician: Cyndia Skeeters, DO Supervising Physician (if applicable): Dr. Jerre Simon, MD  Informed consent was obtained prior to the procedure. Time out performed. The area surrounding the skin lesion was prepared and cleaned with povidone-iodine swab stick and wiped clean with alcohol prep. The area was sufficiently anesthetized with 1% Lidocainewith epinephrine.  A size 3mm disposable biopsy punch tool was used; No sample was obtained as contents of nodule were chalky liquid. No tissue able to be obtained for analysis.   Pressure and Band-Aid  was used to obtain hemostasis. The site was not closed. The patient tolerated the procedure well without complications.  Standard post-procedure care was explained and return precautions given.  Cyndia Skeeters, DO [facility], PGY-1 11/17/23 9:19 AM  Service pager [phone]

## 2023-11-17 NOTE — Progress Notes (Signed)
 Mobility Specialist Progress Note:    11/17/23 1600  Oxygen Therapy  O2 Device Nasal Cannula  O2 Flow Rate (L/min) 2 L/min  Mobility  Activity Ambulated independently in hallway  Level of Assistance Modified independent, requires aide device or extra time  Assistive Device None  Distance Ambulated (ft) 300 ft  Activity Response Tolerated well  Mobility Referral Yes  Mobility visit 1 Mobility  Mobility Specialist Start Time (ACUTE ONLY) 1535  Mobility Specialist Stop Time (ACUTE ONLY) 1549  Mobility Specialist Time Calculation (min) (ACUTE ONLY) 14 min   Received pt in bed having no complaints and agreeable to mobility. Pt was asymptomatic throughout ambulation and returned to room w/o fault. Left in bed w/ call bell in reach and all needs met.   D'Vante Earlene Plater Mobility Specialist Please contact via Special educational needs teacher or Rehab office at [phone]

## 2023-11-17 NOTE — Assessment & Plan Note (Addendum)
 Lupus-has not seen rheumatologist since last year, not on any therapy currently. HTN-continue home amlodipine 10 mg daily, Coreg 25 mg BID, irbesartan 300 mg daily Calciphylaxis-scheduled Tylenol 1000 mg q6, scheduled oxycodone 15 mg q4. GERD-continue home Protonix 40 mg daily

## 2023-11-17 NOTE — Procedures (Addendum)
 Received patient in bed to unit.  Alert and oriented.  Informed consent signed and in chart.   TX duration: 3.5 hours  Patient tolerated well.  Transported back to the room  Alert, without acute distress.  Hand-off given to patient's nurse.   Access used: left fistula Access issues: none  Total UF removed:  3 liters Medication(s) given: dilaudid Post weight-79.5 kg   Lu Duffel, RN [facility]

## 2023-11-17 NOTE — Assessment & Plan Note (Addendum)
 Much improved from presentation. -Pulse ox, wean oxygen as able

## 2023-11-17 NOTE — Progress Notes (Signed)
 [facility] Progress Note   Subjective:    Seen and examined patient at bedside. Received HD this morning (patient bumped yesterday 2nd high HD census). Noted 3L was removed. She's still c/o SOB and on O2. Plan for HD again tomorrow. Discussed with Primary. Restarting Lasix.  Objective Vitals:   11/17/23 0745 11/17/23 0801 11/17/23 0811 11/17/23 0909  BP: (!) 148/84 (!) 149/83 (!) 154/86 (!) 154/82  Pulse: 78  78 77  Resp: 18 (!) 8 13   Temp:  99.1 F (37.3 C)    TempSrc:      SpO2:    100%  Weight:   79.5 kg   Height:       Physical Exam General: Young woman, chronically ill-appearing, on 2L, NAD  Heart:S1 and S2; No murmurs, gallops, or rubs Lungs:Clear throughout; No rales, wheezes, or rhonchi Abdomen: Soft and non-tender Extremities: No LE edema Skin: Multiple lumps within bilateral upper extremities and buttocks Dialysis Access: L AVG (+) B/T   Filed Weights   11/14/23 0624 11/17/23 0811  Weight: 72.6 kg 79.5 kg    Intake/Output Summary (Last 24 hours) at 11/17/2023 1557 Last data filed at 11/17/2023 1500 Gross per 24 hour  Intake 240 ml  Output 3600 ml  Net -3360 ml    Additional Objective Labs: Basic Metabolic Panel: Recent Labs  Lab 11/14/23 0638 11/14/23 1530 11/15/23 0745 11/17/23 0400  NA 138  --  135 129*  K 5.3*  --  4.3 4.4  CL 102  --  96* 93*  CO2 15*  --  25 21*  GLUCOSE 79  --  86 94  BUN 107*  --  41* 65*  CREATININE 14.90* 15.92* 8.12* 11.22*  CALCIUM 8.8*  --  9.1 8.4*  PHOS  --   --  6.6* 9.5*   Liver Function Tests: Recent Labs  Lab 11/14/23 0849 11/15/23 0745 11/17/23 0400  AST 34  --   --   ALT 10  --   --   ALKPHOS 61  --   --   BILITOT 1.1  --   --   PROT 6.8  --   --   ALBUMIN 3.1* 3.2* 2.9*   Recent Labs  Lab 11/14/23 0849  LIPASE 29   CBC: Recent Labs  Lab 11/14/23 0638 11/14/23 1530 11/15/23 0745 11/17/23 0400  WBC 11.1* 8.2 9.0 9.7  NEUTROABS 6.3  --   --  6.1  HGB 9.2* 9.2* 10.6*  9.7*  HCT 29.8* 29.0* 33.5* 31.1*  MCV 98.3 97.0 95.2 97.2  PLT 184 174 182 225   Blood Culture    Component Value Date/Time   SDES BLOOD BLOOD RIGHT ARM 10/10/2023 0206   SPECREQUEST  10/10/2023 0206    BOTTLES DRAWN AEROBIC AND ANAEROBIC Blood Culture results may not be optimal due to an inadequate volume of blood received in culture bottles   CULT  10/10/2023 0206    NO GROWTH 5 DAYS Performed at [facility], [address] [place], [place] [address]    REPTSTATUS 10/15/2023 FINAL 10/10/2023 0206    Cardiac Enzymes: No results for input(s): "CKTOTAL", "CKMB", "CKMBINDEX", "TROPONINI" in the last 168 hours. CBG: No results for input(s): "GLUCAP" in the last 168 hours. Iron Studies: No results for input(s): "IRON", "TIBC", "TRANSFERRIN", "FERRITIN" in the last 72 hours. Lab Results  Component Value Date   INR 1.2 10/10/2023   INR 1.0 03/27/2019   INR 1.01 01/25/2018   Studies/Results: DG CHEST PORT  1 VIEW Result Date: 11/17/2023 CLINICAL DATA:  Shortness of breath. EXAM: PORTABLE CHEST 1 VIEW COMPARISON:  November 14, 2023. FINDINGS: Stable cardiomegaly. Both lungs are clear. The visualized skeletal structures are unremarkable. IMPRESSION: No active disease. Electronically Signed   By: Lupita Raider M.D.   On: 11/17/2023 14:13    Medications:   acetaminophen  1,000 mg Oral Q6H   amLODipine  10 mg Oral Daily   carvedilol  25 mg Oral BID WC   Chlorhexidine Gluconate Cloth  6 each Topical Q0600   heparin  5,000 Units Subcutaneous Q8H   irbesartan  300 mg Oral Daily   oxyCODONE  15 mg Oral Q4H    Dialysis Orders: [facility]  on MWF . 180NRe 3hr BFR 350 DFR 500 EDW 71.5kg 2K 2Ca  AVG 15g  No heparin Mircera IV q 2 weeks- last dose 11/01/23 Hectorol IV q HD Sensipar 180mg  PO q HD  Assessment/Plan: ESRD:  Patient missed dialysis x2 last week. Received HD 3/16 and 3/19 (bumped yesterday 2nd high HD census). Although CXR appears ok, she is still  c/o SOB with O2. Plan for HD again tomorrow. Restarting Lasix. HCG level negative. Chest pain: may be related to HTN, resolved at present. Mgt per admitting team Hypertension/volume: home meds resumed and Bps coming down with UF. She still reports occasional SOB. Repeat CXR looks ok. See above Anemia: Hgb 9.7, will give ESA with HD tomorrow. Metabolic bone disease: Calcium controlled. Continue hectorol, sensipar and phos binders Multiple lumps BUEs/Buttocks: Doesn't appear to be calciphylaxis but maybe a possible abscess vs. Hidradenitis vs. Tophaceous gout. Discussed with Primary. Attempted skin biopsy today but they were unable to obtain skin specimen so referrals placed for both Derm and Rheum in outpatient Nutrition:  Will need renal diet/fluid restrictions  Salome Holmes, NP [facility] 11/17/2023,3:57 PM  LOS: 3 days

## 2023-11-17 NOTE — Assessment & Plan Note (Addendum)
 Improved this morning. - Can offer Tigan 200 mg q6 PRN for nausea/vomiting -- Could modify diet should pain persist.

## 2023-11-17 NOTE — Progress Notes (Signed)
     Daily Progress Note Intern Pager: [phone]  Patient name: Linda [name] Medical record number: [id number] Date of birth: 1996-01-19 Age: 28 y.o. Gender: female  Primary Care Provider: Reubin Milan, PA-C Consultants: Nephrology Code Status: Full  Pt Overview and Major Events to Date:  3/16-admitted; partial HD session 3/19-early AM HD  Assessment and Plan: Linda [name] is a 28 year old female with ESRD continuing HD inpatient; dyspnea continues to improve.  Assessment & Plan Dyspnea Much improved from presentation. -Pulse ox, wean oxygen as able ESRD (end stage renal disease) on dialysis (HCC) HD session today.  - Nephrology consulted, appreciate recommendations -Hopeful that patient may be appropriate for discharge after HD Abdominal pain Improved this morning. - Can offer Tigan 200 mg q6 PRN for nausea/vomiting -- Could modify diet should pain persist. Chronic heart failure with preserved ejection fraction (HFpEF) (HCC) Most recent echo last month with EF of 40-45%.  This morning stable on exam, no JVD or LE edema. -Continuous cardiac monitoring Skin lesions Attempted biopsy of lesion on the right yesterday 3/18, however this was unsuccessful.  Lesion was ultimately liquid chalky consistency which drained freely.  No tissue could be collected for sampling.  Patient tolerated procedure well.  Consider lupus panniculitis versus gouty tophi. - Plan to biopsy 1 of these lesions today if patient is agreeable - Consider dermatology referral outpatient Chronic health problem Lupus-has not seen rheumatologist since last year, not on any therapy currently. HTN-continue home amlodipine 10 mg daily, Coreg 25 mg BID, irbesartan 300 mg daily Calciphylaxis-scheduled Tylenol 1000 mg q6, scheduled oxycodone 15 mg q4. GERD-continue home Protonix 40 mg daily   FEN/GI: Renal diet, 1200 mL fluid restriction PPx: Heparin Dispo:Home today. Barriers include clinical stability.    Subjective:  Seen this morning during dialysis.  She reports right arm pain proximal to attempted biopsy site yesterday and asks if we could drain additional nodule where the pain is located.  Otherwise reports she is doing well.  Objective: Temp:  [97.7 F (36.5 C)-99.1 F (37.3 C)] 99.1 F (37.3 C) (03/19 0801) Pulse Rate:  [70-108] 77 (03/19 0909) Resp:  [8-18] 13 (03/19 0811) BP: (136-170)/(78-102) 154/82 (03/19 0909) SpO2:  [89 %-100 %] 100 % (03/19 0909) Weight:  [79.5 kg] 79.5 kg (03/19 0811) Physical Exam: General: Lying in bed in HD.  Somewhat tired appearing.  NAD. Cardiovascular: RRR, no murmurs Respiratory: CTA anteriorly.  Normal work of breathing. Abdomen: Normoactive bowel sounds, soft, nontender. Extremities: Warm and well-perfused  Laboratory: Most recent CBC Lab Results  Component Value Date   WBC 9.7 11/17/2023   HGB 9.7 (L) 11/17/2023   HCT 31.1 (L) 11/17/2023   MCV 97.2 11/17/2023   PLT 225 11/17/2023   Most recent BMP    Latest Ref Rng & Units 11/17/2023    4:00 AM  BMP  Glucose 70 - 99 mg/dL 94   BUN 6 - 20 mg/dL 65   Creatinine 0.86 - 1.00 mg/dL 57.84   Sodium 696 - 295 mmol/L 129   Potassium 3.5 - 5.1 mmol/L 4.4   Chloride 98 - 111 mmol/L 93   CO2 22 - 32 mmol/L 21   Calcium 8.9 - 10.3 mg/dL 8.4     Linda Skeeters, DO 11/17/2023, 12:27 PM  PGY-1, [facility] Family Medicine FPTS Intern pager: [phone], text pages welcome Secure chat group [facility] Teaching Service

## 2023-11-17 NOTE — Assessment & Plan Note (Addendum)
 Most recent echo last month with EF of 40-45%.  This morning stable on exam, no JVD or LE edema. -Continuous cardiac monitoring

## 2023-11-17 NOTE — Assessment & Plan Note (Addendum)
 HD session today.  - Nephrology consulted, appreciate recommendations -Hopeful that patient may be appropriate for discharge after HD

## 2023-11-17 NOTE — Plan of Care (Signed)

## 2023-11-17 NOTE — Discharge Instructions (Signed)
 Dear Linda [name],   Thank you for letting us participate in your care! We are so glad you are feeling better. You were hospitalized to catch up on your missed dialysis sessions because your breathing and abdominal pain were worsening. You got dialysis and started to feel better.  It is really important that you continue your normal dialysis schedule outside of the hospital. You should also follow up with your rheumatologist to get care for your Lupus.   POST-HOSPITAL & CARE INSTRUCTIONS We recommend following up with your PCP within 1 week from being discharged from the hospital. Please let PCP/Specialists know of any changes in medications that were made which you will be able to see in the medications section of this packet. Please also follow up with your Rheumatologist.  DOCTOR'S APPOINTMENTS & FOLLOW UP Future Appointments  Date Time Provider Department Center  12/02/2023  2:45 PM Alver Sorrow, NP [facility]  12/16/2023  2:10 PM [facility] DERMATOLOGY [facility]INIC [facility]     Thank you for choosing [facility]! Take care and be well!  Family Medicine Teaching Service Inpatient Team [place]  [facility]  [address] [place], [place] [address] [phone]   Community Resource Guide Shelters The United Way's "B3979455" is a great source of information about community services available.  Access by dialing 2-1-1 from anywhere in [place], or by website -  PooledIncome.pl.   Other Armed forces technical officer Number and Address  [facility] Housing for homeless and needy men with substance abuse issues 5 day Covid Quarantine [phone] [address] [place], [place]  [employer] of [place] Emergency assistance for [employer] only [employer] [phone] [address]. [address] [place], [place]  [employer] of the [place] Domestic violence shelter for women  and their children [phone] [place], [place]  [facility] Domestic violence shelter for women and their children Each family gets their own unit and can quarantine after admission. [phone] [place], [place]  [facility]) / Resources for the [employer] center for the homeless Information and referral to housing resources Counseling Showers Laundry Barbershop Phone bank Mailroom Computer lab Medical clinic Bike maintenance center 14 Day covid quarantine [phone] [address] [place], [place]  [facility] [employer] Men's Shelter Emergency housing Food Emergency financial assistance Permanent supportive housing [phone] [address] [place], [place]  [employer] Crisis assistance Medication Housing Food Utility assistance [phone] [address] [place], [place]   [phone] [address], [place], [place]  [employer] of [place]       Transitional housing Case Chartered certified accountant assistance [phone] [address] [place] [address] [place], [place]  [facility], [employer] for adult men and women Can admit with MD clearance from the hospital after positive covid test.  Intake Hotline [phone] [address] [place], [place]  24-hour Crisis Line for those Facing Homelessness   Information and referral to community resources [phone]  [employer] and additional resources. Can admit with MD clearance after positive covid test.  Prefer online applications (blocked on [facility] computers)/ currently full. Will be able to do intake at the office starting in May.  [phone] Admin only location     [facility]) now has a full time staff to take referrals for  all individuals needing shelter or housing placement. They do not do direct services or have beds,  but are in charge of assessing and coordinating placement for individuals needing shelter. The phone number for coordinated entry is [phone].   [facility] [employer] [address] [place], [place] [address] [phone]

## 2023-11-18 DIAGNOSIS — Z992 Dependence on renal dialysis: Secondary | ICD-10-CM | POA: Diagnosis not present

## 2023-11-18 DIAGNOSIS — N186 End stage renal disease: Secondary | ICD-10-CM | POA: Diagnosis not present

## 2023-11-18 LAB — CBC WITH DIFFERENTIAL/PLATELET
Abs Immature Granulocytes: 0.02 10*3/uL (ref 0.00–0.07)
Basophils Absolute: 0 10*3/uL (ref 0.0–0.1)
Basophils Relative: 0 %
Eosinophils Absolute: 0.6 10*3/uL — ABNORMAL HIGH (ref 0.0–0.5)
Eosinophils Relative: 9 %
HCT: 32.7 % — ABNORMAL LOW (ref 36.0–46.0)
Hemoglobin: 10.2 g/dL — ABNORMAL LOW (ref 12.0–15.0)
Immature Granulocytes: 0 %
Lymphocytes Relative: 25 %
Lymphs Abs: 1.7 10*3/uL (ref 0.7–4.0)
MCH: 29.9 pg (ref 26.0–34.0)
MCHC: 31.2 g/dL (ref 30.0–36.0)
MCV: 95.9 fL (ref 80.0–100.0)
Monocytes Absolute: 0.9 10*3/uL (ref 0.1–1.0)
Monocytes Relative: 13 %
Neutro Abs: 3.7 10*3/uL (ref 1.7–7.7)
Neutrophils Relative %: 53 %
Platelets: 211 10*3/uL (ref 150–400)
RBC: 3.41 MIL/uL — ABNORMAL LOW (ref 3.87–5.11)
RDW: 17.6 % — ABNORMAL HIGH (ref 11.5–15.5)
WBC: 7 10*3/uL (ref 4.0–10.5)
nRBC: 0 % (ref 0.0–0.2)

## 2023-11-18 LAB — RENAL FUNCTION PANEL
Albumin: 2.8 g/dL — ABNORMAL LOW (ref 3.5–5.0)
Anion gap: 16 — ABNORMAL HIGH (ref 5–15)
BUN: 38 mg/dL — ABNORMAL HIGH (ref 6–20)
CO2: 24 mmol/L (ref 22–32)
Calcium: 8.9 mg/dL (ref 8.9–10.3)
Chloride: 91 mmol/L — ABNORMAL LOW (ref 98–111)
Creatinine, Ser: 8.11 mg/dL — ABNORMAL HIGH (ref 0.44–1.00)
GFR, Estimated: 6 mL/min — ABNORMAL LOW (ref >60)
Glucose, Bld: 111 mg/dL — ABNORMAL HIGH (ref 70–99)
Phosphorus: 7 mg/dL — ABNORMAL HIGH (ref 2.5–4.6)
Potassium: 3.7 mmol/L (ref 3.5–5.1)
Sodium: 131 mmol/L — ABNORMAL LOW (ref 135–145)

## 2023-11-18 MED ORDER — DARBEPOETIN ALFA 100 MCG/0.5ML IJ SOSY: 100.0000 ug | PREFILLED_SYRINGE | INTRAMUSCULAR | Status: DC

## 2023-11-18 NOTE — Assessment & Plan Note (Addendum)
 Attempted biopsy of lesion on the right yesterday 3/18, however this was unsuccessful.  Lesion was ultimately liquid chalky consistency which drained freely.  No tissue could be collected for sampling.  Patient tolerated procedure well.  Consider lupus panniculitis versus gouty tophi. - Consider dermatology referral outpatient

## 2023-11-18 NOTE — Progress Notes (Signed)
     Daily Progress Note Intern Pager: [phone]  Patient name: Linda [name] Medical record number: [id number] Date of birth: 1996-05-13 Age: 28 y.o. Gender: female  Primary Care Provider: Reubin Milan, PA-C Consultants: Nephrology Code Status: Full  Pt Overview and Major Events to Date:  3/16-admitted; partial HD session 3/19-early AM HD 3/20-HD today  Assessment and Plan: Linda [name] is a 28 year old female with ESRD continuing HD inpatient; significantly improved since arrival, however continues to have some dyspnea and on low level supplemental oxygen.  Restarted home Lasix yesterday and anticipate additional HD session today which should help.  Assessment & Plan Dyspnea Still with some dyspnea on 2 L via [place].  Has been maintaining O2 saturations in the high 90s. -Pulse ox, wean oxygen as able ESRD (end stage renal disease) on dialysis [facility]) Additional HD session today.  - Nephrology consulted, appreciate recommendations -Hopeful that patient may be appropriate for discharge after HD Abdominal pain Improved this morning. - Can offer Tigan 200 mg q6 PRN for nausea/vomiting -- Could modify diet should pain persist. Chronic heart failure with preserved ejection fraction (HFpEF) (HCC) Most recent echo last month with EF of 40-45%.  This morning stable on exam, no JVD or LE edema. Skin lesions Attempted biopsy of lesion on the right yesterday 3/18, however this was unsuccessful.  Lesion was ultimately liquid chalky consistency which drained freely.  No tissue could be collected for sampling.  Patient tolerated procedure well.  Consider lupus panniculitis versus gouty tophi. - Consider dermatology referral outpatient Chronic health problem Lupus-has not seen rheumatologist since last year, not on any therapy currently. HTN-continue home amlodipine 10 mg daily, Coreg 25 mg BID, irbesartan 300 mg daily Calciphylaxis-scheduled Tylenol 1000 mg q6, scheduled oxycodone 15 mg  q4. GERD-continue home Protonix 40 mg daily   FEN/GI: Renal diet, fluid restriction 1200 mL PPx: Heparin Dispo:Home today. Barriers include HD.   Subjective:  Patient seen this AM lying in bed. Asks if additional lesions can be drained today. Breathing somewhat improved.  Objective: Temp:  [98.1 F (36.7 C)-99.1 F (37.3 C)] 98.1 F (36.7 C) (03/20 0745) Pulse Rate:  [76-79] 79 (03/20 0745) Resp:  [8-18] 18 (03/20 0745) BP: (141-162)/(77-98) 158/98 (03/20 0745) SpO2:  [96 %-100 %] 96 % (03/20 0745) Weight:  [74.8 kg-79.5 kg] 74.8 kg (03/19 1500) Physical Exam: General: well-appearing, NAD. Cardiovascular: RRR, no murmurs. Respiratory: CTA bilaterally, normal work of breathing  Laboratory: Most recent CBC Lab Results  Component Value Date   WBC 9.7 11/17/2023   HGB 9.7 (L) 11/17/2023   HCT 31.1 (L) 11/17/2023   MCV 97.2 11/17/2023   PLT 225 11/17/2023   Most recent BMP    Latest Ref Rng & Units 11/17/2023    4:00 AM  BMP  Glucose 70 - 99 mg/dL 94   BUN 6 - 20 mg/dL 65   Creatinine 1.02 - 1.00 mg/dL 72.53   Sodium 664 - 403 mmol/L 129   Potassium 3.5 - 5.1 mmol/L 4.4   Chloride 98 - 111 mmol/L 93   CO2 22 - 32 mmol/L 21   Calcium 8.9 - 10.3 mg/dL 8.4      Linda Skeeters, DO 11/18/2023, 7:53 AM  PGY-1, [facility] Family Medicine FPTS Intern pager: [phone], text pages welcome Secure chat group [facility] Teaching Service

## 2023-11-18 NOTE — Plan of Care (Signed)
  Problem: Clinical Measurements: Goal: Ability to maintain clinical measurements within normal limits will improve Outcome: Progressing Goal: Will remain free from infection Outcome: Progressing   Problem: Activity: Goal: Risk for activity intolerance will decrease Outcome: Progressing   Problem: Nutrition: Goal: Adequate nutrition will be maintained Outcome: Progressing   Problem: Pain Managment: Goal: General experience of comfort will improve and/or be controlled Outcome: Progressing   Problem: Safety: Goal: Ability to remain free from injury will improve Outcome: Progressing   Problem: Skin Integrity: Goal: Risk for impaired skin integrity will decrease Outcome: Progressing

## 2023-11-18 NOTE — Progress Notes (Signed)
 Mobility Specialist Progress Note:    11/18/23 0900  Oxygen Therapy  O2 Device Nasal Cannula  O2 Flow Rate (L/min) 2 L/min  Mobility  Activity Ambulated independently in hallway  Level of Assistance Modified independent, requires aide device or extra time  Assistive Device None  Distance Ambulated (ft) 300 ft  Activity Response Tolerated well  Mobility Referral Yes  Mobility visit 1 Mobility  Mobility Specialist Start Time (ACUTE ONLY) 0901  Mobility Specialist Stop Time (ACUTE ONLY) 0911  Mobility Specialist Time Calculation (min) (ACUTE ONLY) 10 min   Received pt in bed having no complaints and agreeable to mobility. VSS throughout on 2L O2. Pt was asymptomatic throughout ambulation and returned to room w/o fault. Left in bed w/ call bell in reach and all needs met.   D'Vante Earlene Plater Mobility Specialist Please contact via Special educational needs teacher or Rehab office at [phone]

## 2023-11-18 NOTE — Assessment & Plan Note (Addendum)
 Most recent echo last month with EF of 40-45%.  This morning stable on exam, no JVD or LE edema.

## 2023-11-18 NOTE — Assessment & Plan Note (Addendum)
 Improved this morning. - Can offer Tigan 200 mg q6 PRN for nausea/vomiting -- Could modify diet should pain persist.

## 2023-11-18 NOTE — Assessment & Plan Note (Signed)
 Additional HD session today.  - Nephrology consulted, appreciate recommendations -Hopeful that patient may be appropriate for discharge after HD

## 2023-11-18 NOTE — Progress Notes (Addendum)
 [facility] Progress Note   Subjective:    Seen and examined patient at bedside. Appears she is on/off her O2 in her room today. Denies SOB and CP. Plan for HD this afternoon. Lasix restarted yesterday.  Objective Vitals:   11/17/23 1645 11/17/23 1900 11/18/23 0524 11/18/23 0745  BP: (!) 143/83 (!) 141/77 (!) 162/82 (!) 158/98  Pulse: 79 79 76 79  Resp: 18 18 18 18   Temp:  98.3 F (36.8 C) 98.1 F (36.7 C) 98.1 F (36.7 C)  TempSrc:  Oral    SpO2: 100% 100% 100% 96%  Weight:      Height:       Physical Exam General: Young woman, chronically ill-appearing, on 2L O2 (on/off), NAD  Heart:S1 and S2; No murmurs, gallops, or rubs Lungs:Clear throughout; No rales, wheezes, or rhonchi Abdomen: Soft and non-tender Extremities: No LE edema Skin: Multiple lumps within bilateral upper extremities and buttocks Dialysis Access: L AVG (+) B/T   Filed Weights   11/14/23 0624 11/17/23 0811 11/17/23 1500  Weight: 72.6 kg 79.5 kg 74.8 kg    Intake/Output Summary (Last 24 hours) at 11/18/2023 1234 Last data filed at 11/17/2023 1500 Gross per 24 hour  Intake 180 ml  Output 0 ml  Net 180 ml    Additional Objective Labs: Basic Metabolic Panel: Recent Labs  Lab 11/15/23 0745 11/17/23 0400 11/18/23 1023  NA 135 129* 131*  K 4.3 4.4 3.7  CL 96* 93* 91*  CO2 25 21* 24  GLUCOSE 86 94 111*  BUN 41* 65* 38*  CREATININE 8.12* 11.22* 8.11*  CALCIUM 9.1 8.4* 8.9  PHOS 6.6* 9.5* 7.0*   Liver Function Tests: Recent Labs  Lab 11/14/23 0849 11/15/23 0745 11/17/23 0400 11/18/23 1023  AST 34  --   --   --   ALT 10  --   --   --   ALKPHOS 61  --   --   --   BILITOT 1.1  --   --   --   PROT 6.8  --   --   --   ALBUMIN 3.1* 3.2* 2.9* 2.8*   Recent Labs  Lab 11/14/23 0849  LIPASE 29   CBC: Recent Labs  Lab 11/14/23 0638 11/14/23 1530 11/15/23 0745 11/17/23 0400 11/18/23 1023  WBC 11.1* 8.2 9.0 9.7 7.0  NEUTROABS 6.3  --   --  6.1 3.7  HGB 9.2* 9.2* 10.6*  9.7* 10.2*  HCT 29.8* 29.0* 33.5* 31.1* 32.7*  MCV 98.3 97.0 95.2 97.2 95.9  PLT 184 174 182 225 211   Blood Culture    Component Value Date/Time   SDES BLOOD BLOOD RIGHT ARM 10/10/2023 0206   SPECREQUEST  10/10/2023 0206    BOTTLES DRAWN AEROBIC AND ANAEROBIC Blood Culture results may not be optimal due to an inadequate volume of blood received in culture bottles   CULT  10/10/2023 0206    NO GROWTH 5 DAYS Performed at [facility], [address], [place], [place] [address]    REPTSTATUS 10/15/2023 FINAL 10/10/2023 0206    Cardiac Enzymes: No results for input(s): "CKTOTAL", "CKMB", "CKMBINDEX", "TROPONINI" in the last 168 hours. CBG: No results for input(s): "GLUCAP" in the last 168 hours. Iron Studies: No results for input(s): "IRON", "TIBC", "TRANSFERRIN", "FERRITIN" in the last 72 hours. Lab Results  Component Value Date   INR 1.2 10/10/2023   INR 1.0 03/27/2019   INR 1.01 01/25/2018   Studies/Results: DG CHEST PORT 1 VIEW Result  Date: 11/17/2023 CLINICAL DATA:  Shortness of breath. EXAM: PORTABLE CHEST 1 VIEW COMPARISON:  November 14, 2023. FINDINGS: Stable cardiomegaly. Both lungs are clear. The visualized skeletal structures are unremarkable. IMPRESSION: No active disease. Electronically Signed   By: Lupita Raider M.D.   On: 11/17/2023 14:13    Medications:   acetaminophen  1,000 mg Oral Q6H   amLODipine  10 mg Oral Daily   carvedilol  25 mg Oral BID WC   Chlorhexidine Gluconate Cloth  6 each Topical Q0600   darbepoetin (ARANESP) injection - DIALYSIS  100 mcg Subcutaneous Q Thu-1800   furosemide  80 mg Oral TID   heparin  5,000 Units Subcutaneous Q8H   irbesartan  300 mg Oral Daily   oxyCODONE  15 mg Oral Q4H    Dialysis Orders: [facility]  on MWF . 180NRe 3hr BFR 350 DFR 500 EDW 71.5kg 2K 2Ca  AVG 15g  No heparin Mircera IV q 2 weeks- last dose 11/01/23 Hectorol IV q HD Sensipar 180mg  PO q HD  Assessment/Plan: ESRD:  Patient  missed dialysis x2 last week. Received HD 3/16 overnight and 3/19 (bumped d/t high pt census). Although CXR appears ok, she is still c/o SOB with O2. Plan for HD again today. Lasix restarted. HCG level negative. Next HD 3/21. Chest pain: may be related to HTN, resolved at present. Mgt per admitting team Hypertension/volume: home meds resumed and Bps coming down with UF. She still reports occasional SOB. Repeat CXR looks ok. See above Anemia: Hgb 9.7, will give ESA with HD today. Metabolic bone disease: Calcium controlled. Continue hectorol, sensipar and phos binders Multiple lumps BUEs/Buttocks: Doesn't appear to be calciphylaxis but maybe a possible abscess vs. Hidradenitis vs. Tophaceous gout. Discussed with Primary. Attempted skin biopsy today but they were unable to obtain skin specimen so referrals placed for both Derm and Rheum in outpatient Nutrition:  Will need renal diet/fluid restrictions   Salome Holmes, NP [facility] 11/18/2023,12:34 PM  LOS: 4 days

## 2023-11-18 NOTE — Assessment & Plan Note (Signed)
 Lupus-has not seen rheumatologist since last year, not on any therapy currently. HTN-continue home amlodipine 10 mg daily, Coreg 25 mg BID, irbesartan 300 mg daily Calciphylaxis-scheduled Tylenol 1000 mg q6, scheduled oxycodone 15 mg q4. GERD-continue home Protonix 40 mg daily

## 2023-11-18 NOTE — Assessment & Plan Note (Addendum)
 Still with some dyspnea on 2 L via [place].  Has been maintaining O2 saturations in the high 90s. -Pulse ox, wean oxygen as able

## 2023-11-18 NOTE — Care Plan (Signed)
 Called and spoke with HD unit. They have orders for dialysis this evening, but will be a while because of other urgent patients requiring dialysis. However, did confirm that she is to receive HD.  Darral Dash, DO

## 2023-11-19 ENCOUNTER — Other Ambulatory Visit (HOSPITAL_COMMUNITY): Payer: Self-pay

## 2023-11-19 LAB — RENAL FUNCTION PANEL
Albumin: 3.2 g/dL — ABNORMAL LOW (ref 3.5–5.0)
Anion gap: 14 (ref 5–15)
BUN: 18 mg/dL (ref 6–20)
CO2: 25 mmol/L (ref 22–32)
Calcium: 9.6 mg/dL (ref 8.9–10.3)
Chloride: 93 mmol/L — ABNORMAL LOW (ref 98–111)
Creatinine, Ser: 4.68 mg/dL — ABNORMAL HIGH (ref 0.44–1.00)
GFR, Estimated: 12 mL/min — ABNORMAL LOW (ref >60)
Glucose, Bld: 85 mg/dL (ref 70–99)
Phosphorus: 4.6 mg/dL (ref 2.5–4.6)
Potassium: 4.1 mmol/L (ref 3.5–5.1)
Sodium: 132 mmol/L — ABNORMAL LOW (ref 135–145)

## 2023-11-19 MED ORDER — OXYCODONE HCL 15 MG PO TABS
15.0000 mg | ORAL_TABLET | Freq: Four times a day (QID) | ORAL | 0 refills | Status: DC | PRN
  Filled 2023-11-19: qty 20, 5d supply, fill #0

## 2023-11-19 NOTE — Discharge Summary (Signed)
 Family Medicine Teaching [facility] Discharge Summary  Patient name: Linda [name] Medical record number: [id number] Date of birth: [birth year] Age: 28 y.o. Gender: female Date of Admission: 11/14/2023  Date of Discharge: 11/19/2023 Admitting Physician: Cyndia Skeeters, DO  Primary Care Provider: Reubin Milan, PA-C Consultants: Nephrology  Indication for Hospitalization: Dyspnea 2/2 missed dialysis  Brief Hospital Course:  Linda [name] is a 28 y.o.female with a history of lupus, ESRD who was admitted to the [facility] Medicine Teaching Service at [facility] for shortness of breath after missing 2 HD sessions.   Her hospital course is detailed below:  Dyspnea  chest pain In the setting of volume overload due to missed sessions.  In the ED found to have leukocytosis, anemia, hyperkalemia, anion gap.  Respiratory panel and troponins were negative.  Patient did have chest x-ray and CT A/P which were largely unremarkable concern for possible acute cholecystitis which was ultimately ruled out abdominal ultrasound.  Patient also received IV Lasix in the ED. She was ultimately able to complete HD in the hospital and her symptoms significantly improved.  ESRD Patient went to HD following admission, but was not able to complete full session. She went for additional HD session on 3/19, 3/21.  Nodules Patient with multiple firm nodules across her body, suspicious for calciphylaxis, lupus panniculitis, or possible gouty tophi.  Did attempt to biopsy 1 of these lesions on her right forearm, however no tissue was able to be collected.  Nodule drained a somewhat chalky fluid.  Patient tolerated the procedure well.  Other chronic conditions were medically managed with home medications and formulary alternatives as necessary (calciphylaxis, HTN, GERD)   PCP Follow-up Recommendations: Needs follow up with Rheumatology as she has not seen them recently, is not on any therapies for lupus Hep B  nonimmune; recommend offering vaccination outpatient. Needs follow-up with dermatology outpatient for further evaluation of nodules, consider biopsy/aspiration; may benefit from having these drained in the [facility] clinic.   Discharge Diagnoses/Problem List:  Principal Problem:   ESRD (end stage renal disease) on dialysis [facility]) Active Problems:   ESRD (end stage renal disease) (HCC)   Chronic heart failure with preserved ejection fraction (HFpEF) (HCC)   Chronic health problem   Abdominal pain   Dyspnea   Skin lesions  Disposition: Home  Discharge Condition: Stable  Discharge Exam: General: Well-appearing, lying in bed, NAD. CV: RRR, no murmurs Respiratory: CTA bilaterally, normal work of breathing on room air Extremities: Moves all equally.  Significant Procedures: None  Significant Labs and Imaging:  Recent Labs  Lab 11/18/23 1023  WBC 7.0  HGB 10.2*  HCT 32.7*  PLT 211   Recent Labs  Lab 11/18/23 1023 11/19/23 0746  NA 131* 132*  K 3.7 4.1  CL 91* 93*  CO2 24 25  GLUCOSE 111* 85  BUN 38* 18  CREATININE 8.11* 4.68*  CALCIUM 8.9 9.6  PHOS 7.0* 4.6  ALBUMIN 2.8* 3.2*    3/16 CXR: Cardiac enlargement.  No acute findings.   3/16 CT A/P: 1. Possible acute cholecystitis. No defined gallstone. No bile duct dilation. Recommend ultrasound for further assessment if symptoms suggest acute cholecystitis. 2. No other evidence of an acute abnormality within the abdomen or pelvis. 3. Stable hepatomegaly. 4. Aortic atherosclerosis. 5. Small amount of nonspecific ascites/free fluid in the posterior pelvic recess.   3/16 US abdomen:  1. No sonographic evidence of acute cholecystitis. 2. Hepatomegaly with diffuse hepatic steatosis. 3. Echogenic appearance of the right renal parenchyma, which  can be seen in the setting of medical renal disease.  3/19 CXR: No active disease.  Results/Tests Pending at Time of Discharge: None  Discharge Medications:  Allergies as of  11/19/2023       Reactions   Iodine Hives, Shortness Of Breath   Lisinopril Anaphylaxis   Angioedema   Gabapentin Other (See Comments)   Reaction: Tremor (intolerance); tremor   Ativan [lorazepam] Other (See Comments)   Somnolent with 1mg  ativan at [facility] Nov 2019   Hydroxychloroquine Other (See Comments)   Pt reports making her skin peel really bad   Vicodin [hydrocodone-acetaminophen] Itching, Rash        Medication List     STOP taking these medications    hydrOXYzine 10 MG tablet Commonly known as: ATARAX   tiZANidine 4 MG tablet Commonly known as: ZANAFLEX       TAKE these medications    acetaminophen 325 MG tablet Commonly known as: TYLENOL Take 2 tablets (650 mg total) by mouth every 6 (six) hours as needed for mild pain (pain score 1-3) or moderate pain (pain score 4-6) (or Fever >/= 101).   amLODipine 10 MG tablet Commonly known as: NORVASC Take 10 mg by mouth daily.   calcitRIOL 0.25 MCG capsule Commonly known as: ROCALTROL Take 1 tablet by mouth daily.   camphor-menthol lotion Commonly known as: SARNA Apply topically as needed for itching.   carvedilol 25 MG tablet Commonly known as: COREG Take 1 tablet (25 mg total) by mouth 2 (two) times daily with a meal.   Darbepoetin Alfa 100 MCG/0.5ML Sosy injection Commonly known as: ARANESP Inject 0.5 mLs (100 mcg total) into the skin every Thursday at 6pm.   furosemide 80 MG tablet Commonly known as: LASIX Take 80 mg by mouth 3 (three) times daily.   irbesartan 300 MG tablet Commonly known as: AVAPRO Take 1 tablet (300 mg total) by mouth daily.   lidocaine-prilocaine cream Commonly known as: EMLA Apply 1 Application topically.   medroxyPROGESTERone 150 MG/ML injection Commonly known as: DEPO-PROVERA Inject 1 mL (150 mg total) into the muscle every 3 (three) months.   Narcan 4 MG/0.1ML Liqd nasal spray kit Generic drug: naloxone Place 1 spray into the nose once. If needed for emergency  overdose.   nitroGLYCERIN 0.4 MG SL tablet Commonly known as: NITROSTAT Place 1 tablet (0.4 mg total) under the tongue every 5 (five) minutes as needed for chest pain.   ondansetron 4 MG disintegrating tablet Commonly known as: ZOFRAN-ODT Take 1 tablet (4 mg total) by mouth every 8 (eight) hours as needed for nausea or vomiting.   oxyCODONE 15 MG immediate release tablet Commonly known as: ROXICODONE Take 1 tablet (15 mg total) by mouth every 6 (six) hours as needed for pain.   pantoprazole 40 MG tablet Commonly known as: PROTONIX Take 1 tablet (40 mg total) by mouth daily as needed (for acid reflux).   sodium bicarbonate 650 MG tablet Take 1 tablet (650 mg total) by mouth 2 (two) times daily.   tretinoin 0.025 % cream Commonly known as: RETIN-A Apply 1 application  topically at bedtime.   Vitamin D (Ergocalciferol) 1.25 MG (50000 UNIT) Caps capsule Commonly known as: DRISDOL ergocalciferol (vitamin D2) 1,250 mcg (50,000 unit) capsule TAKE 1 CAPSULE BY MOUTH WEEKLY        Discharge Instructions: Please refer to Patient Instructions section of EMR for full details.  Patient was counseled important signs and symptoms that should prompt return to medical care, changes in  medications, dietary instructions, activity restrictions, and follow up appointments.   Follow-Up Appointments: -Please call your rheumatologist to schedule follow-up appointment. - You have follow-up scheduled with the [facility] dermatology clinic on Thursday/17 at 2 PM.  Cyndia Skeeters, DO 11/19/2023, 11:22 AM PGY-1, [facility] Family Medicine

## 2023-11-19 NOTE — TOC Transition Note (Signed)
 Transition of Care [facility]) - Discharge Note   Patient Details  Name: NIMRIT KEHRES MRN: [id number] Date of Birth: December 01, 1995  Transition of Care [facility]) CM/SW Contact:  Tom-Johnson, Hershal Coria, RN Phone Number: 11/19/2023, 11:30 AM   Clinical Narrative:     Patient is scheduled for discharge today.  Readmission Risk Assessment done. Outpatient f/u, hospital f/u and discharge instructions on AVS. Resources for Housing and Shelters on AVS.  Grandmother, Scarlette Calico to transport at discharge.  No further TOC needs noted.        Final next level of care: Home/Self Care Barriers to Discharge: Barriers Resolved   Patient Goals and CMS Choice Patient states their goals for this hospitalization and ongoing recovery are:: To return home CMS Medicare.gov Compare Post Acute Care list provided to:: Patient Choice offered to / list presented to : Patient      Discharge Placement                Patient to be transferred to facility by: Grandmother Name of family member notified: Scarlette Calico    Discharge Plan and Services Additional resources added to the After Visit Summary for                  DME Arranged: N/A DME Agency: NA       HH Arranged: NA HH Agency: NA        Social Drivers of Health (SDOH) Interventions SDOH Screenings   Food Insecurity: Food Insecurity Present (11/14/2023)  Housing: High Risk (11/14/2023)  Transportation Needs: Unmet Transportation Needs (11/14/2023)  Utilities: At Risk (11/14/2023)  Depression (PHQ2-9): Low Risk  (02/23/2020)  Financial Resource Strain: Low Risk  (09/10/2023)   Received from [facility]  Social Connections: Unknown (01/12/2022)   Received from [facility], [facility]  Tobacco Use: High Risk (11/14/2023)     Readmission Risk Interventions    11/15/2023    2:53 PM 10/25/2023    3:32 PM 10/02/2021    9:11 AM  Readmission Risk Prevention Plan  Transportation Screening Complete Complete   PCP or Specialist Appt  within 3-5 Days   Complete  HRI or Home Care Consult  Complete Complete  Social Work Consult for Recovery Care Planning/Counseling  Complete   Palliative Care Screening  Complete   Medication Review [facility]) Referral to Pharmacy Referral to Pharmacy   PCP or Specialist appointment within 3-5 days of discharge Complete    HRI or Home Care Consult Complete    SW Recovery Care/Counseling Consult Complete    Palliative Care Screening Not Applicable    Skilled Nursing Facility Not Applicable

## 2023-11-19 NOTE — Progress Notes (Signed)
 [facility] Progress Note   Subjective:    Had dialysis early this am. Feels ok. No chest pain, dyspnea. Likely home today   Objective Vitals:   11/19/23 0640 11/19/23 0713 11/19/23 0900 11/19/23 1100  BP:  (!) 220/91 (!) 150/77   Pulse:  79 77   Resp:  18    Temp:  98 F (36.7 C) 98.2 F (36.8 C)   TempSrc:  Oral Oral   SpO2:  100% 100% 100%  Weight: 78.7 kg     Height:       Physical Exam General: Alert, nad, on room air  Heart: RRR  Lungs: Clear throughout; No rales, wheezes, or rhonchi Abdomen: Soft and non-tender Extremities: No LE edema Dialysis Access: L AVG (+) B/T   Filed Weights   11/17/23 1500 11/19/23 0228 11/19/23 0640  Weight: 74.8 kg 83.2 kg 78.7 kg    Intake/Output Summary (Last 24 hours) at 11/19/2023 1124 Last data filed at 11/19/2023 0955 Gross per 24 hour  Intake 660 ml  Output 3800 ml  Net -3140 ml    Additional Objective Labs: Basic Metabolic Panel: Recent Labs  Lab 11/17/23 0400 11/18/23 1023 11/19/23 0746  NA 129* 131* 132*  K 4.4 3.7 4.1  CL 93* 91* 93*  CO2 21* 24 25  GLUCOSE 94 111* 85  BUN 65* 38* 18  CREATININE 11.22* 8.11* 4.68*  CALCIUM 8.4* 8.9 9.6  PHOS 9.5* 7.0* 4.6   Liver Function Tests: Recent Labs  Lab 11/14/23 0849 11/15/23 0745 11/17/23 0400 11/18/23 1023 11/19/23 0746  AST 34  --   --   --   --   ALT 10  --   --   --   --   ALKPHOS 61  --   --   --   --   BILITOT 1.1  --   --   --   --   PROT 6.8  --   --   --   --   ALBUMIN 3.1*   < > 2.9* 2.8* 3.2*   < > = values in this interval not displayed.   Recent Labs  Lab 11/14/23 0849  LIPASE 29   CBC: Recent Labs  Lab 11/14/23 0638 11/14/23 1530 11/15/23 0745 11/17/23 0400 11/18/23 1023  WBC 11.1* 8.2 9.0 9.7 7.0  NEUTROABS 6.3  --   --  6.1 3.7  HGB 9.2* 9.2* 10.6* 9.7* 10.2*  HCT 29.8* 29.0* 33.5* 31.1* 32.7*  MCV 98.3 97.0 95.2 97.2 95.9  PLT 184 174 182 225 211   Blood Culture    Component Value Date/Time   SDES  BLOOD BLOOD RIGHT ARM 10/10/2023 0206   SPECREQUEST  10/10/2023 0206    BOTTLES DRAWN AEROBIC AND ANAEROBIC Blood Culture results may not be optimal due to an inadequate volume of blood received in culture bottles   CULT  10/10/2023 0206    NO GROWTH 5 DAYS Performed at [facility], [address] [place], [place] [address]    REPTSTATUS 10/15/2023 FINAL 10/10/2023 0206    Cardiac Enzymes: No results for input(s): "CKTOTAL", "CKMB", "CKMBINDEX", "TROPONINI" in the last 168 hours. CBG: No results for input(s): "GLUCAP" in the last 168 hours. Iron Studies: No results for input(s): "IRON", "TIBC", "TRANSFERRIN", "FERRITIN" in the last 72 hours. Lab Results  Component Value Date   INR 1.2 10/10/2023   INR 1.0 03/27/2019   INR 1.01 01/25/2018   Studies/Results: No results found.   Medications:  acetaminophen  1,000 mg Oral Q6H   amLODipine  10 mg Oral Daily   carvedilol  25 mg Oral BID WC   Chlorhexidine Gluconate Cloth  6 each Topical Q0600   darbepoetin (ARANESP) injection - DIALYSIS  100 mcg Subcutaneous Q Thu-1800   furosemide  80 mg Oral TID   heparin  5,000 Units Subcutaneous Q8H   irbesartan  300 mg Oral Daily   oxyCODONE  15 mg Oral Q4H    Dialysis Orders: [place] [facility]  on MWF . 180NRe 3hr BFR 350 DFR 500 EDW 71.5kg 2K 2Ca  AVG 15g  No heparin Mircera IV q 2 weeks- last dose 11/01/23 Hectorol IV q HD Sensipar 180mg  PO q HD  Assessment/Plan: ESRD: atient missed dialysis x2 last week. HD x 3 here. Continue MWF schedule.  Chest pain: resolved at present.  Hypertension/volume: home meds resumed and Bps coming down with UF. UF to EDW as able.  Anemia: Hgb 9.7, will give ESA with HD today. Metabolic bone disease: Calcium controlled. Continue hectorol, sensipar and phos binders Generalized nodular lesions : diffuse to upper extremities/back/buttocks. Doesn't appear to be calciphylaxis but maybe a possible abscess vs. Hidradenitis vs. Tophaceous  gout. Discussed with Primary. Attempted skin biopsy but they were unable to obtain skin specimen so referrals placed for both Derm and Rheum in outpatient Nutrition:  Will need renal diet/fluid restrictions   Tomasa Blase PA-C [facility] 11/19/2023,11:25 AM

## 2023-11-19 NOTE — Progress Notes (Signed)
 D/C noted. Contacted [facility] to be advised of pt's d/c today and that pt should resume care on Monday.   Olivia Canter Renal Navigator [phone]

## 2023-11-19 NOTE — Care Management Important Message (Signed)
 Important Message  Patient Details  Name: Linda [name] MRN: [id number] Date of Birth: 1995-12-10   Important Message Given:  Yes - Medicare IM     [name] 11/19/2023, 10:53 AM

## 2023-11-19 NOTE — Discharge Planning (Signed)
 [facility] Patient Discharge Orders- [facility] CLINIC: AF  Patient's name: MARIJKE GUADIANA Admit/DC Dates: 11/14/2023 - 11/19/2023  Discharge Diagnoses: Dyspnea/chest pain  - 2/2 volume overload/missed dialysis  Nodular lesions    Recent Labs  Lab 11/18/23 1023 11/19/23 0746  HGB 10.2*  --   K 3.7 4.1  CALCIUM 8.9 9.6  PHOS 7.0* 4.6  ALBUMIN 2.8* 3.2*   Aranesp: Given: Yes   Date of last dose/amount: 3/20 100 mcg   PRBC's Given: -- ESA dose for discharge :  Mircera 150 mcg IV q 2 weeks   Outpatient Dialysis Orders:  -Heparin: No change  -EDW No change  -[place]: No change   Access intervention/Change: --  Medication Orders: ---  OTHER/APPTS Follow up with dermatology for evaluation of nodules   Completed by: Tomasa Blase PA-C  D/C Meds to be reconciled by nurse after every discharge.   Reviewed by: MD:______ RN_______

## 2023-11-19 NOTE — Care Management Important Message (Addendum)
 Important Message  Patient Details  Name: Linda [name] MRN: [id number] Date of Birth: 11-06-1995   Important Message Given:  Yes - Medicare IM  Patient left prior to IM delivery will mail a copy to the patient home address   [name] 11/19/2023, 12:56 PM

## 2023-11-19 NOTE — Progress Notes (Signed)
 DISCHARGE NOTE HOME Linda [name] to be discharged Home per MD order. Discussed prescriptions and follow up appointments with the patient. Prescriptions given to patient; medication list explained in detail. Patient verbalized understanding.  Skin clean, dry and intact without evidence of skin break down, no evidence of skin tears noted. IV catheter discontinued intact. Site without signs and symptoms of complications. Dressing and pressure applied. Pt denies pain at the site currently. No complaints noted.  Patient free of lines, drains, and wounds.   An After Visit Summary (AVS) was printed and given to the patient. Patient escorted via wheelchair, and discharged home via private auto.  Margarita Grizzle, RN

## 2023-11-19 NOTE — Plan of Care (Signed)

## 2023-11-19 NOTE — Progress Notes (Signed)
 Received patient in bed to unit.  Alert and oriented. x4 Informed consent signed and in chart.   TX duration:3.5 hours  Patient tolerated well.  Transported back to the room  Alert, without acute distress.  Hand-off given to patient's nurse.   Access used: AVG Access issues: none  Total UF removed: 3800 Medication(s) given: none Post HD VS: see table below Post HD weight:78.7kg   11/19/23 0636  Vitals  Temp 98.2 F (36.8 C)  Temp Source Oral  BP (!) 173/80  MAP (mmHg) 97  BP Location Right Leg  BP Method Automatic  Patient Position (if appropriate) Lying  Pulse Rate 81  Pulse Rate Source Monitor  ECG Heart Rate 80  Resp 15  Oxygen Therapy  SpO2 99 %  O2 Device Nasal Cannula  O2 Flow Rate (L/min) 0 L/min  Patient Activity (if Appropriate) In bed  Pulse Oximetry Type Continuous  During Treatment Monitoring  Blood Flow Rate (mL/min) 0 mL/min  Arterial Pressure (mmHg) -0.2 mmHg  Venous Pressure (mmHg) -1.01 mmHg  TMP (mmHg) -49.09 mmHg  Ultrafiltration Rate (mL/min) 1401 mL/min  Dialysate Flow Rate (mL/min) 300 ml/min  Dialysate Potassium Concentration 3  Dialysate Calcium Concentration 2.5  Duration of HD Treatment -hour(s) 3.5 hour(s)  Cumulative Fluid Removed (mL) per Treatment  3800.29  HD Safety Checks Performed Yes  Intra-Hemodialysis Comments Tolerated well  Post Treatment  Dialyzer Clearance Lightly streaked  Hemodialysis Intake (mL) 0 mL  Liters Processed 84  Fluid Removed (mL) 3800 mL  Tolerated HD Treatment Yes  Post-Hemodialysis Comments goal met  AVG/AVF Arterial Site Held (minutes) 5 minutes  AVG/AVF Venous Site Held (minutes) 5 minutes  Fistula / Graft Left Upper arm  No placement date or time found.   Orientation: Left  Access Location: Upper arm  Site Condition No complications  Fistula / Graft Assessment Present;Thrill;Bruit  Status Deaccessed;Flushed;Patent      [name]

## 2023-11-20 ENCOUNTER — Telehealth: Payer: Self-pay | Admitting: Nephrology

## 2023-11-20 NOTE — Telephone Encounter (Signed)
 Transition of Care - Initial Contact from Inpatient Facility  Date of discharge: 11/19/23 Date of contact: 11/20/23 Method: Phone Spoke to: Patient  Patient contacted to discuss transition of care from recent inpatient hospitalization. Patient was admitted to [facility] from 3/16-3/21/25 with discharge diagnosis of volume overload secondary to missed dialysis.   The discharge medication list was reviewed. Patient understands changes.   Patient will return to his/her outpatient HD unit on: Monday. Reminded her to follow fluid restriction, including ice.  No other concerns at this time.

## 2023-11-22 DIAGNOSIS — N186 End stage renal disease: Secondary | ICD-10-CM | POA: Diagnosis not present

## 2023-11-22 DIAGNOSIS — Z992 Dependence on renal dialysis: Secondary | ICD-10-CM | POA: Diagnosis not present

## 2023-11-22 DIAGNOSIS — N2581 Secondary hyperparathyroidism of renal origin: Secondary | ICD-10-CM | POA: Diagnosis not present

## 2023-11-22 DIAGNOSIS — D509 Iron deficiency anemia, unspecified: Secondary | ICD-10-CM | POA: Diagnosis not present

## 2023-11-22 DIAGNOSIS — D631 Anemia in chronic kidney disease: Secondary | ICD-10-CM | POA: Diagnosis not present

## 2023-11-22 NOTE — Progress Notes (Signed)
 Complex Care Management Care Guide Note  11/22/2023 Name: SHAQUANDA GRAVES MRN: [id number] DOB: May 24, 1996  Kallie Locks Wannamaker is a 28 y.o. year old female who is a primary care patient of Reubin Milan, Cordelia Poche and is actively engaged with the care management team. I reached out to Regenia Skeeter by phone today to assist with re-scheduling  with the RN Case Manager.  Follow up plan: Unsuccessful telephone outreach attempt made. A HIPAA compliant phone message was left for the patient providing contact information and requesting a return call. No further outreach attempts will be made due to inability to maintain patient contact.   Burman Nieves, CMA, Care Guide [facility]  [facility], [facility] Guide Direct Dial: [phone]  Fax: [phone] Website: [email or web address]

## 2023-11-22 NOTE — Progress Notes (Signed)
 Complex Care Management Care Guide Note  11/22/2023 Name: Linda [name] MRN: [id number] DOB: 09/20/1995  Linda [name] is a 28 y.o. year old female who is a primary care patient of Reubin Milan, Cordelia Poche and is actively engaged with the care management team. I reached out to Regenia Skeeter by phone today to assist with re-scheduling  with the RN Case Manager.  Follow up plan: Telephone appointment with complex care management team member scheduled for:  11/25/2023  Burman Nieves, CMA, Care Guide [facility], [facility] Guide Direct Dial: [phone]  Fax: [phone] Website: [email or web address]

## 2023-11-24 DIAGNOSIS — Z992 Dependence on renal dialysis: Secondary | ICD-10-CM | POA: Diagnosis not present

## 2023-11-24 DIAGNOSIS — N186 End stage renal disease: Secondary | ICD-10-CM | POA: Diagnosis not present

## 2023-11-24 DIAGNOSIS — D631 Anemia in chronic kidney disease: Secondary | ICD-10-CM | POA: Diagnosis not present

## 2023-11-24 DIAGNOSIS — N2581 Secondary hyperparathyroidism of renal origin: Secondary | ICD-10-CM | POA: Diagnosis not present

## 2023-11-24 DIAGNOSIS — D509 Iron deficiency anemia, unspecified: Secondary | ICD-10-CM | POA: Diagnosis not present

## 2023-11-25 ENCOUNTER — Ambulatory Visit: Payer: Self-pay | Admitting: *Deleted

## 2023-11-25 NOTE — Patient Instructions (Signed)
 Visit Information  Thank you for taking time to visit with me today. Please don't hesitate to contact me if I can be of assistance to you before our next scheduled telephone appointment.  Following are the goals we discussed today:  Call PCP office to ask about medication to treat anxiety. Call department of social services to find out who your caseworker is and get a list of eligible resources. Weigh self and check blood pressure daily.  Please call the Suicide and Crisis Lifeline: 988 call the [place] National Suicide Prevention Lifeline: 773-178-6574 or TTY: 6713281337 TTY (727)359-9682) to talk to a trained counselor call 1-800-273-TALK (toll free, 24 hour hotline) call 911 if you are experiencing a Mental Health or Behavioral Health Crisis or need someone to talk to.  Patient verbalizes understanding of instructions and care plan provided today and agrees to view in MyChart. Active MyChart status and patient understanding of how to access instructions and care plan via MyChart confirmed with patient.     The patient has been provided with contact information for the care management team and has been advised to call with any health related questions or concerns.   Rodney Langton, RN, MSN, CCM [facility], [facility] RN Care Coordinator Direct Dial: [phone] / Main [phone] Fax [phone] Email: [email or web address] Website: [email or web address]

## 2023-11-25 NOTE — Patient Outreach (Signed)
 Care Coordination   Initial Visit Note   11/25/2023 Name: Linda [name] MRN: [id number] DOB: 20-Apr-1996  Linda [name] is a 28 y.o. year old female who sees [facility], [place], [place] for primary care. I spoke with  Regenia Skeeter by phone today.  What matters to the patients health and wellness today?  Patient has had multiple hospital admissions over the last 6 months (5 since January). Admits she has not been keeping up with HD treatments due to several social factors, but working to do better now.  She has been provided resources for housing and transportation.  Currently living with her grandmother.  Grandfather passed away last week, which has increased her anxiety, but she will contact PCP about treatment. Denies any urgent concerns, encouraged to contact this care manager with questions.     Goals Addressed             This Visit's Progress    COMPLETED: Care Coordination Activites       Interventions Today    Flowsheet Row Most Recent Value  Chronic Disease   Chronic disease during today's visit Congestive Heart Failure (CHF), Chronic Kidney Disease/End Stage Renal Disease (ESRD), Other  [lupus]  General Interventions   General Interventions Discussed/Reviewed General Interventions Reviewed, Durable Medical Equipment (DME), Doctor Visits, [employer]  [Provided with number to social services, advised to call to identify case worker and receive additional benefits she is eligible for.]  Doctor Visits Discussed/Reviewed Doctor Visits Reviewed, PCP, Specialist  [upcoming with cardiology on 4/3, PCP on 4/10, HD 3 times a week. Admits she has missed several appts due to transportation, reminded she had transportation resources through insurance]  Horticulturist, commercial (DME) BP Cuff, Other  [scale.  Confirmed family has scale and BP monitor, advised to weigh self and check BP daily]  PCP/Specialist Visits Compliance with follow-up visit  [Reminded of compliance with  appts, especially HD sessions]  Exercise Interventions   Exercise Discussed/Reviewed Weight Managment  Weight Management Weight maintenance  [Discussed importance of daily weights, re-educated on HF weight goals as printed on discharge AVS]  Education Interventions   Education Provided Provided Education  Provided Verbal Education On Nutrition, Medication, When to see the doctor, Mental Health/Coping with Illness, Insurance Plans, [employer] reviewed, report compliance. Advised to call case worker at DSS for additional resources.  Reviewed housing resources as provided by hospital at discharge.]  Mental Health Interventions   Mental Health Discussed/Reviewed Grief and Loss, Coping Strategies, Anxiety  [Patient report she will reach out to PCP about meds for anxiety, provided with contact information to office.]  Nutrition Interventions   Nutrition Discussed/Reviewed Nutrition Reviewed, Decreasing salt, Adding fruits and vegetables, Fluid intake  [Admits she eat a lot of ice, reminded that this turns into water and she should be mindful of fluid intake.  Also advised to stay away from processed foods that are higher in sodium]              SDOH assessments and interventions completed:  Yes  SDOH Interventions Today    Flowsheet Row Most Recent Value  SDOH Interventions   Food Insecurity Interventions Other (Comment)  [Referral for community resource care guide]  Housing Interventions Other (Comment), Community Resources Provided  [Reviewed housing resourced provided at discharge]  Transportation Interventions Other (Comment)  [reviewed medicaid and medicare transportation resources]  Utilities Interventions Other (Comment)  [Aware of resources once she is back in her own home]  Care Coordination Interventions:  Yes, provided   Follow up plan: No further intervention required. Patient's PCP is with [facility].  Encounter Outcome:  Patient Visit Completed    Rodney Langton, RN, MSN, CCM [place]  [facility], [facility] RN Care Coordinator Direct Dial: [phone] / Main [phone] Fax [phone] Email: [email or web address]: [email or web address]

## 2023-11-27 DIAGNOSIS — D631 Anemia in chronic kidney disease: Secondary | ICD-10-CM | POA: Diagnosis not present

## 2023-11-27 DIAGNOSIS — N186 End stage renal disease: Secondary | ICD-10-CM | POA: Diagnosis not present

## 2023-11-27 DIAGNOSIS — Z992 Dependence on renal dialysis: Secondary | ICD-10-CM | POA: Diagnosis not present

## 2023-11-27 DIAGNOSIS — D509 Iron deficiency anemia, unspecified: Secondary | ICD-10-CM | POA: Diagnosis not present

## 2023-11-27 DIAGNOSIS — N2581 Secondary hyperparathyroidism of renal origin: Secondary | ICD-10-CM | POA: Diagnosis not present

## 2023-11-29 DIAGNOSIS — D509 Iron deficiency anemia, unspecified: Secondary | ICD-10-CM | POA: Diagnosis not present

## 2023-11-29 DIAGNOSIS — M3210 Systemic lupus erythematosus, organ or system involvement unspecified: Secondary | ICD-10-CM | POA: Diagnosis not present

## 2023-11-29 DIAGNOSIS — N186 End stage renal disease: Secondary | ICD-10-CM | POA: Diagnosis not present

## 2023-11-29 DIAGNOSIS — D631 Anemia in chronic kidney disease: Secondary | ICD-10-CM | POA: Diagnosis not present

## 2023-11-29 DIAGNOSIS — N2581 Secondary hyperparathyroidism of renal origin: Secondary | ICD-10-CM | POA: Diagnosis not present

## 2023-11-29 DIAGNOSIS — Z992 Dependence on renal dialysis: Secondary | ICD-10-CM | POA: Diagnosis not present

## 2023-12-01 DIAGNOSIS — R197 Diarrhea, unspecified: Secondary | ICD-10-CM | POA: Diagnosis not present

## 2023-12-01 DIAGNOSIS — N186 End stage renal disease: Secondary | ICD-10-CM | POA: Diagnosis not present

## 2023-12-01 DIAGNOSIS — D631 Anemia in chronic kidney disease: Secondary | ICD-10-CM | POA: Diagnosis not present

## 2023-12-01 DIAGNOSIS — D509 Iron deficiency anemia, unspecified: Secondary | ICD-10-CM | POA: Diagnosis not present

## 2023-12-01 DIAGNOSIS — N2581 Secondary hyperparathyroidism of renal origin: Secondary | ICD-10-CM | POA: Diagnosis not present

## 2023-12-01 DIAGNOSIS — D689 Coagulation defect, unspecified: Secondary | ICD-10-CM | POA: Diagnosis not present

## 2023-12-01 DIAGNOSIS — Z992 Dependence on renal dialysis: Secondary | ICD-10-CM | POA: Diagnosis not present

## 2023-12-02 ENCOUNTER — Institutional Professional Consult (permissible substitution) (HOSPITAL_BASED_OUTPATIENT_CLINIC_OR_DEPARTMENT_OTHER): Admitting: Family

## 2023-12-02 DIAGNOSIS — Z79891 Long term (current) use of opiate analgesic: Secondary | ICD-10-CM | POA: Diagnosis not present

## 2023-12-02 DIAGNOSIS — M545 Low back pain, unspecified: Secondary | ICD-10-CM | POA: Diagnosis not present

## 2023-12-02 DIAGNOSIS — M25562 Pain in left knee: Secondary | ICD-10-CM | POA: Diagnosis not present

## 2023-12-02 DIAGNOSIS — G894 Chronic pain syndrome: Secondary | ICD-10-CM | POA: Diagnosis not present

## 2023-12-02 DIAGNOSIS — M79673 Pain in unspecified foot: Secondary | ICD-10-CM | POA: Diagnosis not present

## 2023-12-06 DIAGNOSIS — D509 Iron deficiency anemia, unspecified: Secondary | ICD-10-CM | POA: Diagnosis not present

## 2023-12-06 DIAGNOSIS — N186 End stage renal disease: Secondary | ICD-10-CM | POA: Diagnosis not present

## 2023-12-06 DIAGNOSIS — D631 Anemia in chronic kidney disease: Secondary | ICD-10-CM | POA: Diagnosis not present

## 2023-12-06 DIAGNOSIS — D689 Coagulation defect, unspecified: Secondary | ICD-10-CM | POA: Diagnosis not present

## 2023-12-06 DIAGNOSIS — R197 Diarrhea, unspecified: Secondary | ICD-10-CM | POA: Diagnosis not present

## 2023-12-06 DIAGNOSIS — N2581 Secondary hyperparathyroidism of renal origin: Secondary | ICD-10-CM | POA: Diagnosis not present

## 2023-12-06 DIAGNOSIS — Z992 Dependence on renal dialysis: Secondary | ICD-10-CM | POA: Diagnosis not present

## 2023-12-08 DIAGNOSIS — Z992 Dependence on renal dialysis: Secondary | ICD-10-CM | POA: Diagnosis not present

## 2023-12-08 DIAGNOSIS — N2581 Secondary hyperparathyroidism of renal origin: Secondary | ICD-10-CM | POA: Diagnosis not present

## 2023-12-08 DIAGNOSIS — D631 Anemia in chronic kidney disease: Secondary | ICD-10-CM | POA: Diagnosis not present

## 2023-12-08 DIAGNOSIS — D689 Coagulation defect, unspecified: Secondary | ICD-10-CM | POA: Diagnosis not present

## 2023-12-08 DIAGNOSIS — N186 End stage renal disease: Secondary | ICD-10-CM | POA: Diagnosis not present

## 2023-12-08 DIAGNOSIS — R197 Diarrhea, unspecified: Secondary | ICD-10-CM | POA: Diagnosis not present

## 2023-12-08 DIAGNOSIS — D509 Iron deficiency anemia, unspecified: Secondary | ICD-10-CM | POA: Diagnosis not present

## 2023-12-09 ENCOUNTER — Institutional Professional Consult (permissible substitution) (HOSPITAL_BASED_OUTPATIENT_CLINIC_OR_DEPARTMENT_OTHER): Payer: 59 | Admitting: Family

## 2023-12-10 DIAGNOSIS — D509 Iron deficiency anemia, unspecified: Secondary | ICD-10-CM | POA: Diagnosis not present

## 2023-12-10 DIAGNOSIS — D631 Anemia in chronic kidney disease: Secondary | ICD-10-CM | POA: Diagnosis not present

## 2023-12-10 DIAGNOSIS — D689 Coagulation defect, unspecified: Secondary | ICD-10-CM | POA: Diagnosis not present

## 2023-12-10 DIAGNOSIS — N186 End stage renal disease: Secondary | ICD-10-CM | POA: Diagnosis not present

## 2023-12-10 DIAGNOSIS — Z992 Dependence on renal dialysis: Secondary | ICD-10-CM | POA: Diagnosis not present

## 2023-12-10 DIAGNOSIS — N2581 Secondary hyperparathyroidism of renal origin: Secondary | ICD-10-CM | POA: Diagnosis not present

## 2023-12-10 DIAGNOSIS — R197 Diarrhea, unspecified: Secondary | ICD-10-CM | POA: Diagnosis not present

## 2023-12-15 DIAGNOSIS — N2581 Secondary hyperparathyroidism of renal origin: Secondary | ICD-10-CM | POA: Diagnosis not present

## 2023-12-15 DIAGNOSIS — Z992 Dependence on renal dialysis: Secondary | ICD-10-CM | POA: Diagnosis not present

## 2023-12-15 DIAGNOSIS — R197 Diarrhea, unspecified: Secondary | ICD-10-CM | POA: Diagnosis not present

## 2023-12-15 DIAGNOSIS — D631 Anemia in chronic kidney disease: Secondary | ICD-10-CM | POA: Diagnosis not present

## 2023-12-15 DIAGNOSIS — D689 Coagulation defect, unspecified: Secondary | ICD-10-CM | POA: Diagnosis not present

## 2023-12-15 DIAGNOSIS — D509 Iron deficiency anemia, unspecified: Secondary | ICD-10-CM | POA: Diagnosis not present

## 2023-12-15 DIAGNOSIS — N186 End stage renal disease: Secondary | ICD-10-CM | POA: Diagnosis not present

## 2023-12-16 ENCOUNTER — Ambulatory Visit

## 2023-12-21 ENCOUNTER — Encounter (HOSPITAL_COMMUNITY): Payer: Self-pay | Admitting: Emergency Medicine

## 2023-12-21 ENCOUNTER — Inpatient Hospital Stay (HOSPITAL_COMMUNITY)
Admission: EM | Admit: 2023-12-21 | Discharge: 2023-12-24 | DRG: 640 | Disposition: A | Discharge status: Home / Self Care | Attending: Internal Medicine | Admitting: Internal Medicine

## 2023-12-21 ENCOUNTER — Other Ambulatory Visit: Payer: Self-pay

## 2023-12-21 ENCOUNTER — Emergency Department (HOSPITAL_COMMUNITY)

## 2023-12-21 DIAGNOSIS — E8770 Fluid overload, unspecified: Principal | ICD-10-CM | POA: Diagnosis present

## 2023-12-21 DIAGNOSIS — E8729 Other acidosis: Secondary | ICD-10-CM | POA: Diagnosis present

## 2023-12-21 DIAGNOSIS — Z91041 Radiographic dye allergy status: Secondary | ICD-10-CM

## 2023-12-21 DIAGNOSIS — R079 Chest pain, unspecified: Secondary | ICD-10-CM | POA: Diagnosis not present

## 2023-12-21 DIAGNOSIS — M329 Systemic lupus erythematosus, unspecified: Secondary | ICD-10-CM | POA: Diagnosis present

## 2023-12-21 DIAGNOSIS — Z91158 Patient's noncompliance with renal dialysis for other reason: Secondary | ICD-10-CM | POA: Diagnosis not present

## 2023-12-21 DIAGNOSIS — G8929 Other chronic pain: Secondary | ICD-10-CM | POA: Diagnosis not present

## 2023-12-21 DIAGNOSIS — M351 Other overlap syndromes: Secondary | ICD-10-CM | POA: Diagnosis present

## 2023-12-21 DIAGNOSIS — R7989 Other specified abnormal findings of blood chemistry: Secondary | ICD-10-CM | POA: Diagnosis present

## 2023-12-21 DIAGNOSIS — E872 Acidosis, unspecified: Secondary | ICD-10-CM | POA: Diagnosis not present

## 2023-12-21 DIAGNOSIS — Z992 Dependence on renal dialysis: Secondary | ICD-10-CM | POA: Diagnosis not present

## 2023-12-21 DIAGNOSIS — F17290 Nicotine dependence, other tobacco product, uncomplicated: Secondary | ICD-10-CM | POA: Diagnosis present

## 2023-12-21 DIAGNOSIS — Z87892 Personal history of anaphylaxis: Secondary | ICD-10-CM

## 2023-12-21 DIAGNOSIS — I160 Hypertensive urgency: Secondary | ICD-10-CM | POA: Diagnosis not present

## 2023-12-21 DIAGNOSIS — M898X9 Other specified disorders of bone, unspecified site: Secondary | ICD-10-CM | POA: Diagnosis present

## 2023-12-21 DIAGNOSIS — N186 End stage renal disease: Secondary | ICD-10-CM | POA: Diagnosis present

## 2023-12-21 DIAGNOSIS — Z888 Allergy status to other drugs, medicaments and biological substances status: Secondary | ICD-10-CM | POA: Diagnosis not present

## 2023-12-21 DIAGNOSIS — F1120 Opioid dependence, uncomplicated: Secondary | ICD-10-CM | POA: Diagnosis present

## 2023-12-21 DIAGNOSIS — D631 Anemia in chronic kidney disease: Secondary | ICD-10-CM | POA: Diagnosis present

## 2023-12-21 DIAGNOSIS — I120 Hypertensive chronic kidney disease with stage 5 chronic kidney disease or end stage renal disease: Secondary | ICD-10-CM | POA: Diagnosis not present

## 2023-12-21 DIAGNOSIS — N250 Renal osteodystrophy: Secondary | ICD-10-CM | POA: Diagnosis not present

## 2023-12-21 DIAGNOSIS — I5023 Acute on chronic systolic (congestive) heart failure: Secondary | ICD-10-CM | POA: Diagnosis not present

## 2023-12-21 DIAGNOSIS — E875 Hyperkalemia: Principal | ICD-10-CM | POA: Diagnosis present

## 2023-12-21 DIAGNOSIS — R6889 Other general symptoms and signs: Secondary | ICD-10-CM | POA: Diagnosis not present

## 2023-12-21 DIAGNOSIS — Z8249 Family history of ischemic heart disease and other diseases of the circulatory system: Secondary | ICD-10-CM | POA: Diagnosis not present

## 2023-12-21 DIAGNOSIS — I132 Hypertensive heart and chronic kidney disease with heart failure and with stage 5 chronic kidney disease, or end stage renal disease: Secondary | ICD-10-CM | POA: Diagnosis present

## 2023-12-21 DIAGNOSIS — J9601 Acute respiratory failure with hypoxia: Secondary | ICD-10-CM | POA: Diagnosis not present

## 2023-12-21 DIAGNOSIS — R0602 Shortness of breath: Secondary | ICD-10-CM | POA: Diagnosis not present

## 2023-12-21 DIAGNOSIS — R059 Cough, unspecified: Secondary | ICD-10-CM | POA: Diagnosis not present

## 2023-12-21 DIAGNOSIS — K219 Gastro-esophageal reflux disease without esophagitis: Secondary | ICD-10-CM | POA: Diagnosis present

## 2023-12-21 MED ORDER — FUROSEMIDE 10 MG/ML IJ SOLN
80.0000 mg | Freq: Once | INTRAMUSCULAR | Status: AC
  Administered 2023-12-22: 80 mg via INTRAVENOUS
  Filled 2023-12-21: qty 8

## 2023-12-21 NOTE — ED Triage Notes (Signed)
 Patient BIB EMS for evaluation of SHOB.  Pt reports she has missed the last 4 of her dialysis treatments.  Normally has dialysis on MWF.  Was out of town for a music festival and was not able to complete treatment.  States her [facility] started about two days ago.  No reports of fevers.

## 2023-12-21 NOTE — ED Provider Notes (Signed)
 [facility] Provider Note   CSN: [id number] Arrival date & time: 12/21/23  2339     History {Add pertinent medical, surgical, social history, OB history to HPI:1} Chief Complaint  Patient presents with   Shortness of Breath    Linda [name] is a 28 y.o. female.  Patient with a history of ESRD on dialysis, lupus, mixed connective tissue disease, hypertension, LVH presents with shortness of breath.  States she missed her last 4 dialysis sessions due to going to a music festival.  Her last dialysis was Wednesday, April 16.  She normally goes Monday Wednesday Friday.  Has increasing shortness of breath for the past 3 to 4 days.  Feels better with oxygen placed by EMS.  Nonproductive cough.  Developed left-sided chest pain about 7 PM and has been constant worse with palpation and movement.  No radiation of the pain to her arms, neck or back.  No abdominal pain.  No fever or vomiting.  States she does make some urine despite compliance with her Lasix .  Last dialysis session was April 16.   Shortness of Breath Associated symptoms: chest pain   Associated symptoms: no abdominal pain, no fever, no headaches and no vomiting        Home Medications Prior to Admission medications   Medication Sig Start Date End Date Taking? Authorizing Provider  acetaminophen  (TYLENOL ) 325 MG tablet Take 2 tablets (650 mg total) by mouth every 6 (six) hours as needed for mild pain (pain score 1-3) or moderate pain (pain score 4-6) (or Fever >/= 101). Patient not taking: Reported on 11/14/2023 09/13/23   Hongalgi, Anand D, MD  amLODipine  (NORVASC ) 10 MG tablet Take 10 mg by mouth daily. 08/05/21   [provider]  calcitRIOL  (ROCALTROL ) 0.25 MCG capsule Take 1 tablet by mouth daily.    [provider]  camphor-menthol  Eyvonne Hollering) lotion Apply topically as needed for itching. Patient not taking: Reported on 11/14/2023 10/28/23   Loma Rising, MD   carvedilol  (COREG ) 25 MG tablet Take 1 tablet (25 mg total) by mouth 2 (two) times daily with a meal. 09/03/23   Samtani, Jai-Gurmukh, MD  Darbepoetin Alfa  (ARANESP ) 100 MCG/0.5ML SOSY injection Inject 0.5 mLs (100 mcg total) into the skin every Thursday at 6pm. Patient not taking: Reported on 11/25/2023 11/18/23   Lavada Porteous, DO  furosemide  (LASIX ) 80 MG tablet Take 80 mg by mouth 3 (three) times daily. 07/30/23   [provider]  irbesartan  (AVAPRO ) 300 MG tablet Take 1 tablet (300 mg total) by mouth daily. 10/27/23   Gonfa, Taye T, MD  lidocaine -prilocaine  (EMLA ) cream Apply 1 Application topically. 10/20/23   [provider]  medroxyPROGESTERone  (DEPO-PROVERA ) 150 MG/ML injection Inject 1 mL (150 mg total) into the muscle every 3 (three) months. 10/29/22   Gabrielle Joiner, MD  naloxone [facility]) nasal spray 4 mg/0.1 mL Place 1 spray into the nose once. If needed for emergency overdose.    [provider]  nitroGLYCERIN  (NITROSTAT ) 0.4 MG SL tablet Place 1 tablet (0.4 mg total) under the tongue every 5 (five) minutes as needed for chest pain. 10/28/23   Rai, Hurman Maiden, MD  ondansetron  (ZOFRAN -ODT) 4 MG disintegrating tablet Take 1 tablet (4 mg total) by mouth every 8 (eight) hours as needed for nausea or vomiting. 10/28/23   Rai, Hurman Maiden, MD  oxyCODONE  (ROXICODONE ) 15 MG immediate release tablet Take 1 tablet (15 mg total) by mouth every 6 (six) hours  as needed for pain. 11/19/23   Omar Bibber, DO  pantoprazole  (PROTONIX ) 40 MG tablet Take 1 tablet (40 mg total) by mouth daily as needed (for acid reflux). 09/13/23 11/14/23  Hongalgi, Anand D, MD  sodium bicarbonate  650 MG tablet Take 1 tablet (650 mg total) by mouth 2 (two) times daily. 09/05/21   Singh, Prashant K, MD  tretinoin  (RETIN-A ) 0.025 % cream Apply 1 application  topically at bedtime.    [provider]  Vitamin D, Ergocalciferol, (DRISDOL) 1.25 MG (50000 UNIT) CAPS capsule ergocalciferol (vitamin D2)  1,250 mcg (50,000 unit) capsule TAKE 1 CAPSULE BY MOUTH WEEKLY 03/11/21   [provider]      Allergies    Iodine , Lisinopril , Gabapentin, Ativan  [lorazepam ], Hydroxychloroquine , and Vicodin [hydrocodone -acetaminophen ]    Review of Systems   Review of Systems  Constitutional:  Negative for activity change, appetite change and fever.  HENT:  Negative for congestion and rhinorrhea.   Respiratory:  Positive for chest tightness and shortness of breath.   Cardiovascular:  Positive for chest pain.  Gastrointestinal:  Negative for abdominal pain, nausea and vomiting.  Genitourinary:  Negative for dysuria and hematuria.  Musculoskeletal:  Negative for arthralgias and myalgias.  Skin:  Negative for pallor.  Neurological:  Negative for dizziness, weakness and headaches.   all other systems are negative except as noted in the HPI and PMH.    Physical Exam Updated Vital Signs BP (!) 141/102 (BP Location: Right Arm)   Pulse 66   Temp 98.4 F (36.9 C) (Oral)   Resp 16   Ht 5\' 6"  (1.676 m)   Wt 77.1 kg   SpO2 100%   BMI 27.44 kg/m  Physical Exam Vitals and nursing note reviewed.  Constitutional:      General: She is not in acute distress.    Appearance: She is well-developed.     Comments: Dyspneic with conversation  HENT:     Head: Normocephalic and atraumatic.     Mouth/Throat:     Pharynx: No oropharyngeal exudate.  Eyes:     Conjunctiva/sclera: Conjunctivae normal.     Pupils: Pupils are equal, round, and reactive to light.  Neck:     Comments: No meningismus. Cardiovascular:     Rate and Rhythm: Normal rate and regular rhythm.     Heart sounds: Normal heart sounds. No murmur heard. Pulmonary:     Effort: Pulmonary effort is normal. No respiratory distress.     Breath sounds: Rales present.  Abdominal:     Palpations: Abdomen is soft.     Tenderness: There is no abdominal tenderness. There is no guarding or rebound.  Musculoskeletal:        General: No  tenderness. Normal range of motion.     Cervical back: Normal range of motion and neck supple.  Skin:    General: Skin is warm.  Neurological:     Mental Status: She is alert and oriented to person, place, and time.     Cranial Nerves: No cranial nerve deficit.     Motor: No abnormal muscle tone.     Coordination: Coordination normal.     Comments:  5/5 strength throughout. CN 2-12 intact.Equal grip strength.   Psychiatric:        Behavior: Behavior normal.     ED Results / Procedures / Treatments   Labs (all labs ordered are listed, but only abnormal results are displayed) Labs Reviewed  RESP PANEL BY RT-PCR (RSV, FLU A&B, COVID)  [id number]  CBC WITH DIFFERENTIAL/PLATELET  COMPREHENSIVE METABOLIC PANEL WITH GFR  BRAIN NATRIURETIC PEPTIDE  D-DIMER, QUANTITATIVE  TROPONIN I (HIGH SENSITIVITY)    EKG None  Radiology No results found.  Procedures Procedures  {Document cardiac monitor, telemetry assessment procedure when appropriate:1}  Medications Ordered in ED Medications  furosemide  (LASIX ) injection 80 mg (has no administration in time range)    ED Course/ Medical Decision Making/ A&P   {   Click here for ABCD2, HEART and other calculatorsREFRESH Note before signing :1}                              Medical Decision Making Amount and/or Complexity of Data Reviewed Labs: ordered. Decision-making details documented in ED Course. Radiology: ordered and independent interpretation performed. Decision-making details documented in ED Course. ECG/medicine tests: ordered and independent interpretation performed. Decision-making details documented in ED Course.  Risk Prescription drug management.   ED who missed 1 week of dialysis here with shortness of breath and chest pain.  Hypoxic 90% on room air which improved with nasal cannula oxygen.  Left-sided chest pain is worse with palpation and movement.  {Document critical care time when appropriate:1} {Document review of  labs and clinical decision tools ie heart score, Chads2Vasc2 etc:1}  {Document your independent review of radiology images, and any outside records:1} {Document your discussion with family members, caretakers, and with consultants:1} {Document social determinants of health affecting pt's care:1} {Document your decision making why or why not admission, treatments were needed:1} Final Clinical Impression(s) / ED Diagnoses Final diagnoses:  None    Rx / DC Orders ED Discharge Orders     None

## 2023-12-22 ENCOUNTER — Encounter (HOSPITAL_COMMUNITY): Payer: Self-pay | Admitting: Family Medicine

## 2023-12-22 DIAGNOSIS — M898X9 Other specified disorders of bone, unspecified site: Secondary | ICD-10-CM | POA: Diagnosis present

## 2023-12-22 DIAGNOSIS — J9601 Acute respiratory failure with hypoxia: Secondary | ICD-10-CM

## 2023-12-22 DIAGNOSIS — Z992 Dependence on renal dialysis: Secondary | ICD-10-CM | POA: Diagnosis not present

## 2023-12-22 DIAGNOSIS — I160 Hypertensive urgency: Secondary | ICD-10-CM | POA: Diagnosis not present

## 2023-12-22 DIAGNOSIS — F1120 Opioid dependence, uncomplicated: Secondary | ICD-10-CM | POA: Diagnosis present

## 2023-12-22 DIAGNOSIS — N250 Renal osteodystrophy: Secondary | ICD-10-CM | POA: Diagnosis not present

## 2023-12-22 DIAGNOSIS — N186 End stage renal disease: Secondary | ICD-10-CM | POA: Diagnosis not present

## 2023-12-22 DIAGNOSIS — M329 Systemic lupus erythematosus, unspecified: Secondary | ICD-10-CM | POA: Diagnosis present

## 2023-12-22 DIAGNOSIS — R7989 Other specified abnormal findings of blood chemistry: Secondary | ICD-10-CM | POA: Diagnosis present

## 2023-12-22 DIAGNOSIS — E8770 Fluid overload, unspecified: Secondary | ICD-10-CM | POA: Diagnosis not present

## 2023-12-22 DIAGNOSIS — M351 Other overlap syndromes: Secondary | ICD-10-CM | POA: Diagnosis present

## 2023-12-22 DIAGNOSIS — D631 Anemia in chronic kidney disease: Secondary | ICD-10-CM | POA: Diagnosis not present

## 2023-12-22 DIAGNOSIS — F17290 Nicotine dependence, other tobacco product, uncomplicated: Secondary | ICD-10-CM | POA: Diagnosis present

## 2023-12-22 DIAGNOSIS — Z91041 Radiographic dye allergy status: Secondary | ICD-10-CM | POA: Diagnosis not present

## 2023-12-22 DIAGNOSIS — Z87892 Personal history of anaphylaxis: Secondary | ICD-10-CM | POA: Diagnosis not present

## 2023-12-22 DIAGNOSIS — I5023 Acute on chronic systolic (congestive) heart failure: Secondary | ICD-10-CM | POA: Diagnosis not present

## 2023-12-22 DIAGNOSIS — E875 Hyperkalemia: Secondary | ICD-10-CM | POA: Diagnosis not present

## 2023-12-22 DIAGNOSIS — E872 Acidosis, unspecified: Secondary | ICD-10-CM | POA: Diagnosis present

## 2023-12-22 DIAGNOSIS — I517 Cardiomegaly: Secondary | ICD-10-CM | POA: Diagnosis not present

## 2023-12-22 DIAGNOSIS — Z888 Allergy status to other drugs, medicaments and biological substances status: Secondary | ICD-10-CM | POA: Diagnosis not present

## 2023-12-22 DIAGNOSIS — E8729 Other acidosis: Secondary | ICD-10-CM | POA: Diagnosis not present

## 2023-12-22 DIAGNOSIS — G8929 Other chronic pain: Secondary | ICD-10-CM | POA: Diagnosis present

## 2023-12-22 DIAGNOSIS — I132 Hypertensive heart and chronic kidney disease with heart failure and with stage 5 chronic kidney disease, or end stage renal disease: Secondary | ICD-10-CM | POA: Diagnosis present

## 2023-12-22 DIAGNOSIS — R0602 Shortness of breath: Secondary | ICD-10-CM | POA: Diagnosis not present

## 2023-12-22 DIAGNOSIS — R079 Chest pain, unspecified: Secondary | ICD-10-CM | POA: Diagnosis not present

## 2023-12-22 DIAGNOSIS — Z8249 Family history of ischemic heart disease and other diseases of the circulatory system: Secondary | ICD-10-CM | POA: Diagnosis not present

## 2023-12-22 DIAGNOSIS — I120 Hypertensive chronic kidney disease with stage 5 chronic kidney disease or end stage renal disease: Secondary | ICD-10-CM | POA: Diagnosis not present

## 2023-12-22 DIAGNOSIS — K219 Gastro-esophageal reflux disease without esophagitis: Secondary | ICD-10-CM | POA: Diagnosis present

## 2023-12-22 DIAGNOSIS — Z91158 Patient's noncompliance with renal dialysis for other reason: Secondary | ICD-10-CM | POA: Diagnosis not present

## 2023-12-22 HISTORY — DX: Other specified abnormal findings of blood chemistry: R79.89

## 2023-12-22 LAB — CBC WITH DIFFERENTIAL/PLATELET
Abs Immature Granulocytes: 0.03 10*3/uL (ref 0.00–0.07)
Basophils Absolute: 0.1 10*3/uL (ref 0.0–0.1)
Basophils Relative: 1 %
Eosinophils Absolute: 0.5 10*3/uL (ref 0.0–0.5)
Eosinophils Relative: 5 %
HCT: 26.5 % — ABNORMAL LOW (ref 36.0–46.0)
Hemoglobin: 7.9 g/dL — ABNORMAL LOW (ref 12.0–15.0)
Immature Granulocytes: 0 %
Lymphocytes Relative: 33 %
Lymphs Abs: 3.3 10*3/uL (ref 0.7–4.0)
MCH: 29.2 pg (ref 26.0–34.0)
MCHC: 29.8 g/dL — ABNORMAL LOW (ref 30.0–36.0)
MCV: 97.8 fL (ref 80.0–100.0)
Monocytes Absolute: 0.8 10*3/uL (ref 0.1–1.0)
Monocytes Relative: 8 %
Neutro Abs: 5.2 10*3/uL (ref 1.7–7.7)
Neutrophils Relative %: 53 %
Platelets: 232 10*3/uL (ref 150–400)
RBC: 2.71 MIL/uL — ABNORMAL LOW (ref 3.87–5.11)
RDW: 17.3 % — ABNORMAL HIGH (ref 11.5–15.5)
WBC: 9.8 10*3/uL (ref 4.0–10.5)
nRBC: 0 % (ref 0.0–0.2)

## 2023-12-22 LAB — TROPONIN I (HIGH SENSITIVITY)
Troponin I (High Sensitivity): 36 ng/L — ABNORMAL HIGH (ref <18)
Troponin I (High Sensitivity): 38 ng/L — ABNORMAL HIGH (ref <18)

## 2023-12-22 LAB — COMPREHENSIVE METABOLIC PANEL WITH GFR
ALT: 12 U/L (ref 0–44)
AST: 17 U/L (ref 15–41)
Albumin: 3.1 g/dL — ABNORMAL LOW (ref 3.5–5.0)
Alkaline Phosphatase: 61 U/L (ref 38–126)
Anion gap: 17 — ABNORMAL HIGH (ref 5–15)
BUN: 86 mg/dL — ABNORMAL HIGH (ref 6–20)
CO2: 15 mmol/L — ABNORMAL LOW (ref 22–32)
Calcium: 9.2 mg/dL (ref 8.9–10.3)
Chloride: 109 mmol/L (ref 98–111)
Creatinine, Ser: 14.95 mg/dL — ABNORMAL HIGH (ref 0.44–1.00)
GFR, Estimated: 3 mL/min — ABNORMAL LOW (ref >60)
Glucose, Bld: 76 mg/dL (ref 70–99)
Potassium: 6.9 mmol/L (ref 3.5–5.1)
Sodium: 141 mmol/L (ref 135–145)
Total Bilirubin: 0.6 mg/dL (ref 0.0–1.2)
Total Protein: 7.4 g/dL (ref 6.5–8.1)

## 2023-12-22 LAB — RENAL FUNCTION PANEL
Albumin: 2.9 g/dL — ABNORMAL LOW (ref 3.5–5.0)
Anion gap: 13 (ref 5–15)
BUN: 30 mg/dL — ABNORMAL HIGH (ref 6–20)
CO2: 26 mmol/L (ref 22–32)
Calcium: 8.5 mg/dL — ABNORMAL LOW (ref 8.9–10.3)
Chloride: 96 mmol/L — ABNORMAL LOW (ref 98–111)
Creatinine, Ser: 6.03 mg/dL — ABNORMAL HIGH (ref 0.44–1.00)
GFR, Estimated: 9 mL/min — ABNORMAL LOW (ref >60)
Glucose, Bld: 90 mg/dL (ref 70–99)
Phosphorus: 4.1 mg/dL (ref 2.5–4.6)
Potassium: 4.6 mmol/L (ref 3.5–5.1)
Sodium: 135 mmol/L (ref 135–145)

## 2023-12-22 LAB — GLUCOSE, CAPILLARY
Glucose-Capillary: 30 mg/dL — CL (ref 70–99)
Glucose-Capillary: 32 mg/dL — CL (ref 70–99)
Glucose-Capillary: 126 mg/dL — ABNORMAL HIGH (ref 70–99)
Glucose-Capillary: 111 mg/dL — ABNORMAL HIGH (ref 70–99)

## 2023-12-22 LAB — D-DIMER, QUANTITATIVE: D-Dimer, Quant: 2.44 ug{FEU}/mL — ABNORMAL HIGH (ref 0.00–0.50)

## 2023-12-22 LAB — RESP PANEL BY RT-PCR (RSV, FLU A&B, COVID)  RVPGX2
Influenza A by PCR: NEGATIVE
Influenza B by PCR: NEGATIVE
Resp Syncytial Virus by PCR: NEGATIVE
SARS Coronavirus 2 by RT PCR: NEGATIVE

## 2023-12-22 LAB — HEPATITIS PANEL, ACUTE
HCV Ab: NONREACTIVE
Hep A IgM: NONREACTIVE
Hep B C IgM: NONREACTIVE
Hepatitis B Surface Ag: NONREACTIVE

## 2023-12-22 LAB — HEPATITIS B SURFACE ANTIGEN: Hepatitis B Surface Ag: NONREACTIVE

## 2023-12-22 LAB — BRAIN NATRIURETIC PEPTIDE: B Natriuretic Peptide: >4500 pg/mL — ABNORMAL HIGH (ref 0.0–100.0)

## 2023-12-22 MED ORDER — CALCIUM GLUCONATE 10 % IV SOLN
1.0000 g | Freq: Once | INTRAVENOUS | Status: AC
  Administered 2023-12-22: 1 g via INTRAVENOUS
  Filled 2023-12-22: qty 10

## 2023-12-22 MED ORDER — ACETAMINOPHEN 325 MG PO TABS: 650.0000 mg | ORAL_TABLET | Freq: Four times a day (QID) | ORAL | Status: DC | PRN

## 2023-12-22 MED ORDER — AMLODIPINE BESYLATE 10 MG PO TABS
10.0000 mg | ORAL_TABLET | Freq: Every day | ORAL | Status: DC
  Administered 2023-12-22 – 2023-12-24 (×3): 10 mg via ORAL
  Filled 2023-12-22: qty 1
  Filled 2023-12-22 (×2): qty 2

## 2023-12-22 MED ORDER — INSULIN ASPART 100 UNIT/ML IV SOLN
5.0000 [IU] | Freq: Once | INTRAVENOUS | Status: AC
  Administered 2023-12-22: 5 [IU] via INTRAVENOUS

## 2023-12-22 MED ORDER — FENTANYL CITRATE PF 50 MCG/ML IJ SOSY
50.0000 ug | PREFILLED_SYRINGE | Freq: Once | INTRAMUSCULAR | Status: AC
  Administered 2023-12-22: 50 ug via INTRAVENOUS
  Filled 2023-12-22: qty 1

## 2023-12-22 MED ORDER — FENTANYL CITRATE PF 50 MCG/ML IJ SOSY
12.5000 ug | PREFILLED_SYRINGE | INTRAMUSCULAR | Status: DC | PRN
  Administered 2023-12-22 – 2023-12-24 (×12): 50 ug via INTRAVENOUS
  Filled 2023-12-22 (×12): qty 1

## 2023-12-22 MED ORDER — SODIUM BICARBONATE 8.4 % IV SOLN
50.0000 meq | Freq: Once | INTRAVENOUS | Status: AC
  Administered 2023-12-22: 50 meq via INTRAVENOUS
  Filled 2023-12-22: qty 50

## 2023-12-22 MED ORDER — OXYCODONE HCL 5 MG PO TABS
5.0000 mg | ORAL_TABLET | Freq: Four times a day (QID) | ORAL | Status: DC | PRN
  Administered 2023-12-22 (×2): 5 mg via ORAL
  Filled 2023-12-22 (×2): qty 1

## 2023-12-22 MED ORDER — FUROSEMIDE 40 MG PO TABS
80.0000 mg | ORAL_TABLET | Freq: Every day | ORAL | Status: DC
  Administered 2023-12-23 – 2023-12-24 (×2): 80 mg via ORAL
  Filled 2023-12-22: qty 4
  Filled 2023-12-22: qty 2

## 2023-12-22 MED ORDER — HEPARIN SODIUM (PORCINE) 5000 UNIT/ML IJ SOLN
5000.0000 [IU] | Freq: Three times a day (TID) | INTRAMUSCULAR | Status: DC
  Administered 2023-12-22 – 2023-12-24 (×4): 5000 [IU] via SUBCUTANEOUS
  Filled 2023-12-22 (×6): qty 1

## 2023-12-22 MED ORDER — FUROSEMIDE 20 MG PO TABS
80.0000 mg | ORAL_TABLET | Freq: Three times a day (TID) | ORAL | Status: DC
Start: 2023-12-22 — End: 2023-12-22
  Administered 2023-12-22: 80 mg via ORAL
  Filled 2023-12-22: qty 4

## 2023-12-22 MED ORDER — CARVEDILOL 25 MG PO TABS
25.0000 mg | ORAL_TABLET | Freq: Two times a day (BID) | ORAL | Status: DC
  Administered 2023-12-22 – 2023-12-24 (×5): 25 mg via ORAL
  Filled 2023-12-22: qty 1
  Filled 2023-12-22: qty 2
  Filled 2023-12-22: qty 1
  Filled 2023-12-22 (×2): qty 2

## 2023-12-22 MED ORDER — ACETAMINOPHEN 650 MG RE SUPP
650.0000 mg | Freq: Four times a day (QID) | RECTAL | Status: DC | PRN
Start: 2023-12-22 — End: 2023-12-24

## 2023-12-22 MED ORDER — CHLORHEXIDINE GLUCONATE CLOTH 2 % EX PADS
6.0000 | MEDICATED_PAD | Freq: Every day | CUTANEOUS | Status: DC
  Administered 2023-12-22: 6 via TOPICAL

## 2023-12-22 MED ORDER — DEXTROSE 50 % IV SOLN
1.0000 | Freq: Once | INTRAVENOUS | Status: AC
  Administered 2023-12-22: 50 mL via INTRAVENOUS
  Filled 2023-12-22: qty 50

## 2023-12-22 MED ORDER — SODIUM CHLORIDE 0.9% FLUSH
3.0000 mL | Freq: Two times a day (BID) | INTRAVENOUS | Status: DC
  Administered 2023-12-22 – 2023-12-24 (×5): 3 mL via INTRAVENOUS

## 2023-12-22 MED ORDER — SODIUM ZIRCONIUM CYCLOSILICATE 10 G PO PACK
10.0000 g | PACK | Freq: Once | ORAL | Status: AC
  Administered 2023-12-22: 10 g via ORAL
  Filled 2023-12-22: qty 1

## 2023-12-22 NOTE — H&P (Signed)
 History and Physical    Linda [name] JYN:[id number] DOB: 01-16-1996 DOA: 12/21/2023  PCP: Kline, Chianne, PA-C   Patient coming from: Home   Chief Complaint: SOB   HPI: Linda [name] is a 28 y.o. female with medical history significant for lupus, hypertension, anemia, chronic HFmrEF, and poor adherence with her treatment plan, now presenting with shortness of breath after missing her last 4 dialysis appointments.  Patient reports that she was unable to attend dialysis sessions due to being out of town for a music festival.  She began developing shortness of breath 2 days ago.  She reported some chest pain associated with this earlier but that has resolved.  She denies fever, chills, leg swelling, leg tenderness, or hemoptysis.  ED Course: Upon arrival to the ED, patient is found to be afebrile and saturating low 90s on 2 L/min of supplemental oxygen with mild tachypnea, normal HR, and elevated BP.  Labs are most notable for potassium 6.9, bicarbonate 15, anion gap 17, BUN 86, hemoglobin 7.9, D-dimer 2.44, and BNP >4500.  Chest x-ray is concerning for CHF.  Nephrology was consulted by the ED physician and the patient was treated with calcium  gluconate, bicarbonate, Lokelma , insulin /dextrose , IV Lasix , and fentanyl .  Review of Systems:  All other systems reviewed and apart from HPI, are negative.  Past Medical History:  Diagnosis Date   Abnormal ECG    a. In setting of LVH w/ repolarization abnormalities; b. 03/2019 MV: No ischemia/infarct. EF 55-65%.   Angioedema    ESRD (end stage renal disease) (HCC)    on HD (M,W,F)   GERD (gastroesophageal reflux disease)    Hypertension    Lupus    LVH (left ventricular hypertrophy)    a. 11/2017 Echo: EF 50-55%. triv AI. Mild MR. Mod L and mild R atrial enlargement. Nl RV size/fxn. Small pericardial effusion w/o tampondae; b. 03/2018 Echo: EF 55-60%, restrictive filling pattern. Nl RV size/fxn. Sev LAE. Mild MR, mild to mod TR/PR. Mod PAH.  Triv effusion.   Migraines    Mixed connective tissue disease (HCC)    Previous Dialysis patient [facility])    a. Off HD since late 2020.   Septic prepatellar bursitis of right knee 08/19/2017    Past Surgical History:  Procedure Laterality Date   GRAFT APPLICATION Left 03/30/2019   Procedure: BRACHIAL CEPHALIC GRAFT CREATION;  Surgeon: Celso College, MD;  Location: [facility] ORS;  Service: Vascular;  Laterality: Left;   I & D EXTREMITY Right 08/06/2017   Procedure: IRRIGATION AND DEBRIDEMENT KNEE;  Surgeon: Laneta Pintos, MD;  Location: [facility] OR;  Service: Orthopedics;  Laterality: Right;   IR THROMBECTOMY AV FISTULA W/THROMBOLYSIS/PTA/STENT INC/SHUNT/IMG LT Left 05/18/2022   IR US  GUIDE VASC ACCESS LEFT  05/18/2022   MULTIPLE TOOTH EXTRACTIONS      Social History:   reports that she has been smoking e-cigarettes. She started smoking about 8 years ago. She has a 2.2 pack-year smoking history. She has never used smokeless tobacco. She reports that she does not drink alcohol and does not use drugs.  Allergies  Allergen Reactions   Iodine  Hives and Shortness Of Breath   Lisinopril  Anaphylaxis    Angioedema   Gabapentin Other (See Comments)    Reaction: Tremor (intolerance); tremor   Ativan  [Lorazepam ] Other (See Comments)    Somnolent with 1mg  ativan  at [facility] Nov 2019   Hydroxychloroquine  Other (See Comments)    Pt reports making her skin peel really bad   Vicodin [Hydrocodone -Acetaminophen ]  Itching and Rash    Family History  Problem Relation Age of Onset   Lupus Mother    Hypertension Mother    Kidney disease Mother    Heart Problems Mother    Coronary artery disease Mother 66       stent   Diabetes Maternal Grandmother      Prior to Admission medications   Medication Sig Start Date End Date Taking? Authorizing Provider  acetaminophen  (TYLENOL ) 325 MG tablet Take 2 tablets (650 mg total) by mouth every 6 (six) hours as needed for mild pain (pain score 1-3) or moderate pain  (pain score 4-6) (or Fever >/= 101). Patient not taking: Reported on 11/14/2023 09/13/23   Casey Clay, MD  amLODipine  (NORVASC ) 10 MG tablet Take 10 mg by mouth daily. 08/05/21   [provider]  calcitRIOL  (ROCALTROL ) 0.25 MCG capsule Take 1 tablet by mouth daily.    [provider]  camphor-menthol  Eyvonne Hollering) lotion Apply topically as needed for itching. Patient not taking: Reported on 11/14/2023 10/28/23   Loma Rising, MD  carvedilol  (COREG ) 25 MG tablet Take 1 tablet (25 mg total) by mouth 2 (two) times daily with a meal. 09/03/23   Verlie Glisson, MD  Darbepoetin Alfa  (ARANESP ) 100 MCG/0.5ML SOSY injection Inject 0.5 mLs (100 mcg total) into the skin every Thursday at 6pm. Patient not taking: Reported on 11/25/2023 11/18/23   Lavada Porteous, DO  furosemide  (LASIX ) 80 MG tablet Take 80 mg by mouth 3 (three) times daily. 07/30/23   [provider]  irbesartan  (AVAPRO ) 300 MG tablet Take 1 tablet (300 mg total) by mouth daily. 10/27/23   Gonfa, Taye T, MD  lidocaine -prilocaine  (EMLA ) cream Apply 1 Application topically. 10/20/23   [provider]  medroxyPROGESTERone  (DEPO-PROVERA ) 150 MG/ML injection Inject 1 mL (150 mg total) into the muscle every 3 (three) months. 10/29/22   Gabrielle Joiner, MD  naloxone [facility]) nasal spray 4 mg/0.1 mL Place 1 spray into the nose once. If needed for emergency overdose.    [provider]  nitroGLYCERIN  (NITROSTAT ) 0.4 MG SL tablet Place 1 tablet (0.4 mg total) under the tongue every 5 (five) minutes as needed for chest pain. 10/28/23   Rai, Hurman Maiden, MD  ondansetron  (ZOFRAN -ODT) 4 MG disintegrating tablet Take 1 tablet (4 mg total) by mouth every 8 (eight) hours as needed for nausea or vomiting. 10/28/23   Rai, Hurman Maiden, MD  oxyCODONE  (ROXICODONE ) 15 MG immediate release tablet Take 1 tablet (15 mg total) by mouth every 6 (six) hours as needed for pain. 11/19/23   Omar Bibber, DO  pantoprazole  (PROTONIX ) 40  MG tablet Take 1 tablet (40 mg total) by mouth daily as needed (for acid reflux). 09/13/23 11/14/23  Hongalgi, Anand D, MD  sodium bicarbonate  650 MG tablet Take 1 tablet (650 mg total) by mouth 2 (two) times daily. 09/05/21   Singh, Prashant K, MD  tretinoin  (RETIN-A ) 0.025 % cream Apply 1 application  topically at bedtime.    [provider]  Vitamin D, Ergocalciferol, (DRISDOL) 1.25 MG (50000 UNIT) CAPS capsule ergocalciferol (vitamin D2) 1,250 mcg (50,000 unit) capsule TAKE 1 CAPSULE BY MOUTH WEEKLY 03/11/21   [provider]    Physical Exam: Vitals:   12/22/23 0315 12/22/23 0355 12/22/23 0400 12/22/23 0430  BP: (!) 172/107 (!) 187/120 (!) 171/123 (!) 165/110  Pulse: 92  87   Resp: (!) 27 (!) 25 (!) 21 (!) 23  Temp:  (!) 97.5 F (36.4 C)  TempSrc:  Oral    SpO2: 99% 100% 100% 100%  Weight:      Height:        Constitutional: NAD, no pallor or diaphoresis   Eyes: PERTLA, lids and conjunctivae normal ENMT: Mucous membranes are moist. Posterior pharynx clear of any exudate or lesions.   Neck: supple, no masses  Respiratory: Speaking in full sentences. Fine rales bilaterally.   Cardiovascular: S1 & S2 heard, regular rate and rhythm. No extremity edema.   Abdomen: No tenderness, soft. Bowel sounds active.  Musculoskeletal: no clubbing / cyanosis. No joint deformity upper and lower extremities.   Skin: Firm non-tender nodules. Warm, dry, well-perfused. Neurologic: CN 2-12 grossly intact. Moving all extremities. Alert and oriented.  Psychiatric: Calm. Cooperative.    Labs and Imaging on Admission: I have personally reviewed following labs and imaging studies  CBC: Recent Labs  Lab 12/22/23 0012  WBC 9.8  NEUTROABS 5.2  HGB 7.9*  HCT 26.5*  MCV 97.8  PLT 232   Basic Metabolic Panel: Recent Labs  Lab 12/22/23 0012  NA 141  K 6.9*  CL 109  CO2 15*  GLUCOSE 76  BUN 86*  CREATININE 14.95*  CALCIUM  9.2   GFR: Estimated Creatinine Clearance: 5.9  mL/min (A) (by C-G formula based on SCr of 14.95 mg/dL (H)). Liver Function Tests: Recent Labs  Lab 12/22/23 0012  AST 17  ALT 12  ALKPHOS 61  BILITOT 0.6  PROT 7.4  ALBUMIN  3.1*   No results for input(s): "LIPASE", "AMYLASE" in the last 168 hours. No results for input(s): "AMMONIA" in the last 168 hours. Coagulation Profile: No results for input(s): "INR", "PROTIME" in the last 168 hours. Cardiac Enzymes: No results for input(s): "CKTOTAL", "CKMB", "CKMBINDEX", "TROPONINI" in the last 168 hours. BNP (last 3 results) No results for input(s): "PROBNP" in the last 8760 hours. HbA1C: No results for input(s): "HGBA1C" in the last 72 hours. CBG: Recent Labs  Lab 12/22/23 0414 12/22/23 0417  GLUCAP 30* 32*   Lipid Profile: No results for input(s): "CHOL", "HDL", "LDLCALC", "TRIG", "CHOLHDL", "LDLDIRECT" in the last 72 hours. Thyroid Function Tests: No results for input(s): "TSH", "T4TOTAL", "FREET4", "T3FREE", "THYROIDAB" in the last 72 hours. Anemia Panel: No results for input(s): "VITAMINB12", "FOLATE", "FERRITIN", "TIBC", "IRON ", "RETICCTPCT" in the last 72 hours. Urine analysis:    Component Value Date/Time   COLORURINE STRAW (A) 11/15/2023 1835   APPEARANCEUR CLEAR 11/15/2023 1835   LABSPEC 1.006 11/15/2023 1835   PHURINE 8.0 11/15/2023 1835   GLUCOSEU 50 (A) 11/15/2023 1835   HGBUR NEGATIVE 11/15/2023 1835   BILIRUBINUR NEGATIVE 11/15/2023 1835   BILIRUBINUR Negative 02/10/2018 1602   KETONESUR NEGATIVE 11/15/2023 1835   PROTEINUR 100 (A) 11/15/2023 1835   UROBILINOGEN 0.2 02/10/2018 1602   UROBILINOGEN 1.0 03/30/2015 2328   NITRITE NEGATIVE 11/15/2023 1835   LEUKOCYTESUR NEGATIVE 11/15/2023 1835   Sepsis Labs: @LABRCNTIP (procalcitonin:4,lacticidven:4) ) Recent Results (from the past 240 hours)  Resp panel by RT-PCR (RSV, Flu A&B, Covid) Anterior Nasal Swab     Status: None   Collection Time: 12/21/23 11:51 PM   Specimen: Anterior Nasal Swab  Result Value  Ref Range Status   SARS Coronavirus 2 by RT PCR NEGATIVE NEGATIVE Final   Influenza A by PCR NEGATIVE NEGATIVE Final   Influenza B by PCR NEGATIVE NEGATIVE Final    Comment: (NOTE) The Xpert Xpress SARS-CoV-2/FLU/RSV plus assay is intended as an aid in the diagnosis of influenza from Nasopharyngeal swab specimens and should not be used  as a sole basis for treatment. Nasal washings and aspirates are unacceptable for Xpert Xpress SARS-CoV-2/FLU/RSV testing.  Fact Sheet for Patients: BloggerCourse.com  Fact Sheet for Healthcare Providers: SeriousBroker.it  This test is not yet approved or cleared by the [place]  FDA and has been authorized for detection and/or diagnosis of SARS-CoV-2 by FDA under an Emergency Use Authorization (EUA). This EUA will remain in effect (meaning this test can be used) for the duration of the COVID-19 declaration under Section 564(b)(1) of the Act, 21 U.S.C. section 360bbb-3(b)(1), unless the authorization is terminated or revoked.     Resp Syncytial Virus by PCR NEGATIVE NEGATIVE Final    Comment: (NOTE) Fact Sheet for Patients: BloggerCourse.com  Fact Sheet for Healthcare Providers: SeriousBroker.it  This test is not yet approved or cleared by the [place]  FDA and has been authorized for detection and/or diagnosis of SARS-CoV-2 by FDA under an Emergency Use Authorization (EUA). This EUA will remain in effect (meaning this test can be used) for the duration of the COVID-19 declaration under Section 564(b)(1) of the Act, 21 U.S.C. section 360bbb-3(b)(1), unless the authorization is terminated or revoked.  Performed at [facility], [address] [place], [place] [address]      Radiological Exams on Admission: No results found.  EKG: Independently reviewed. Sinus rhythm.   Assessment/Plan   1. ESRD; hyperkalemia; hypervolemia;  metabolic acidosis  - Appreciate nephrology arranging emergent HD  - SLIV, renally-dose medications, repeat chem panel    2. Acute on chronic HFmrEF; acute hypoxic respiratory failure  - Given Lasix  in ED and taken for emergent HD  - SLIV, restrict fluids, monitor weight and I/Os, continue Coreg  as tolerated, hold ARB in light of hyperkalemia    3. Hypertensive urgency  - Anticipate improvement with dialysis  - Resume home medications  4. Anemia  - No overt bleeding  - ESA per nephrology   5. Elevated d-dimer  - There is no leg swelling or tenderness, chest pain has resolved  - Consider PE study if respiratory sxs persist despite correction of hypervolemia    DVT prophylaxis: sq heparin   Code Status: Full  Level of Care: Level of care: Progressive Family Communication: None present  Disposition Plan:  Patient is from: Home  Anticipated d/c is to: Home  Anticipated d/c date is: 12/24/23  Patient currently: Pending improved volume status, treatment of hyperkalemia Consults called: Nephrology  Admission status: Inpatient     Walton Guppy, MD [facility]  12/22/2023, 5:21 AM

## 2023-12-22 NOTE — Progress Notes (Signed)
 PROGRESS NOTE    Linda [name]  OZH:[id number] DOB: 1996/07/24 DOA: 12/21/2023 PCP: Kline, Chianne, PA-C   27/F w/ lupus, ESRD on HD MWF hypertension, anemia, chronic HFmrEF, and poor adherence with her treatment plan, presented to the ED with shortness of breath after missing her last 4 dialysis appointments. In the ED tachypneic, tachycardic, hypoxic.  Labs are most notable for potassium 6.9, bicarbonate 15, anion gap 17, BUN 86, hemoglobin 7.9, D-dimer 2.44, and BNP >4500.  Chest x-ray is concerning for CHF.  Subjective: -Back in the ED after dialysis  Assessment and Plan:  ESRD; hyperkalemia; hypervolemia; metabolic acidosis  - Nephrology consulting, sp urgent dialysis this morning - Repeat labs   Acute on chronic HFmrEF; acute hypoxic respiratory failure  - Given Lasix  in ED, sp urgent HD this morning - Continue Coreg , hold ARB    Hypertensive urgency  - Improving, continue Coreg  and amlodipine     Anemia  - No overt bleeding  - ESA per nephrology, check anemia panel   Elevated d-dimer  - There is no leg swelling or tenderness, chest pain has resolved  - Do not suspect acute PE/DVT at this time   DVT prophylaxis: sq heparin   Code Status: Full  Level of Care: Level of care: Progressive Family Communication: None present  Disposition Plan: Home tomorrow after HD  Consultants: Nephrology   Procedures:   Antimicrobials:    Objective: Vitals:   12/22/23 0730 12/22/23 0740 12/22/23 0744 12/22/23 0855  BP: (!) 173/119 (!) 174/129 (!) 178/120 (!) 178/130  Pulse: 70  81 74  Resp: 14 16 18 17   Temp:  (!) 97.5 F (36.4 C)  97.8 F (36.6 C)  TempSrc:  Oral  Oral  SpO2: 100% 100% 100% 100%  Weight:      Height:        Intake/Output Summary (Last 24 hours) at 12/22/2023 1053 Last data filed at 12/22/2023 0740 Gross per 24 hour  Intake --  Output 2500 ml  Net -2500 ml   Filed Weights   12/21/23 2349  Weight: 77.1 kg    Examination:  General exam:  Appears calm and comfortable  Respiratory system: Basilar Rales Cardiovascular system: S1 & S2 heard, RRR.  Abd: nondistended, soft and nontender.Normal bowel sounds heard. Central nervous system: Alert and oriented. No focal neurological deficits. Extremities: Trace edema Skin: No rashes Psychiatry:  Mood & affect appropriate.     Data Reviewed:   CBC: Recent Labs  Lab 12/22/23 0012  WBC 9.8  NEUTROABS 5.2  HGB 7.9*  HCT 26.5*  MCV 97.8  PLT 232   Basic Metabolic Panel: Recent Labs  Lab 12/22/23 0012 12/22/23 0912  NA 141 135  K 6.9* 4.6  CL 109 96*  CO2 15* 26  GLUCOSE 76 90  BUN 86* 30*  CREATININE 14.95* 6.03*  CALCIUM  9.2 8.5*  PHOS  --  4.1   GFR: Estimated Creatinine Clearance: 14.6 mL/min (A) (by C-G formula based on SCr of 6.03 mg/dL (H)). Liver Function Tests: Recent Labs  Lab 12/22/23 0012 12/22/23 0912  AST 17  --   ALT 12  --   ALKPHOS 61  --   BILITOT 0.6  --   PROT 7.4  --   ALBUMIN  3.1* 2.9*   No results for input(s): "LIPASE", "AMYLASE" in the last 168 hours. No results for input(s): "AMMONIA" in the last 168 hours. Coagulation Profile: No results for input(s): "INR", "PROTIME" in the last 168 hours. Cardiac Enzymes: No results for  input(s): "CKTOTAL", "CKMB", "CKMBINDEX", "TROPONINI" in the last 168 hours. BNP (last 3 results) No results for input(s): "PROBNP" in the last 8760 hours. HbA1C: No results for input(s): "HGBA1C" in the last 72 hours. CBG: Recent Labs  Lab 12/22/23 0414 12/22/23 0417 12/22/23 0552 12/22/23 0724  GLUCAP 30* 32* 126* 111*   Lipid Profile: No results for input(s): "CHOL", "HDL", "LDLCALC", "TRIG", "CHOLHDL", "LDLDIRECT" in the last 72 hours. Thyroid Function Tests: No results for input(s): "TSH", "T4TOTAL", "FREET4", "T3FREE", "THYROIDAB" in the last 72 hours. Anemia Panel: No results for input(s): "VITAMINB12", "FOLATE", "FERRITIN", "TIBC", "IRON ", "RETICCTPCT" in the last 72 hours. Urine  analysis:    Component Value Date/Time   COLORURINE STRAW (A) 11/15/2023 1835   APPEARANCEUR CLEAR 11/15/2023 1835   LABSPEC 1.006 11/15/2023 1835   PHURINE 8.0 11/15/2023 1835   GLUCOSEU 50 (A) 11/15/2023 1835   HGBUR NEGATIVE 11/15/2023 1835   BILIRUBINUR NEGATIVE 11/15/2023 1835   BILIRUBINUR Negative 02/10/2018 1602   KETONESUR NEGATIVE 11/15/2023 1835   PROTEINUR 100 (A) 11/15/2023 1835   UROBILINOGEN 0.2 02/10/2018 1602   UROBILINOGEN 1.0 03/30/2015 2328   NITRITE NEGATIVE 11/15/2023 1835   LEUKOCYTESUR NEGATIVE 11/15/2023 1835   Sepsis Labs: @LABRCNTIP (procalcitonin:4,lacticidven:4)  ) Recent Results (from the past 240 hours)  Resp panel by RT-PCR (RSV, Flu A&B, Covid) Anterior Nasal Swab     Status: None   Collection Time: 12/21/23 11:51 PM   Specimen: Anterior Nasal Swab  Result Value Ref Range Status   SARS Coronavirus 2 by RT PCR NEGATIVE NEGATIVE Final   Influenza A by PCR NEGATIVE NEGATIVE Final   Influenza B by PCR NEGATIVE NEGATIVE Final    Comment: (NOTE) The Xpert Xpress SARS-CoV-2/FLU/RSV plus assay is intended as an aid in the diagnosis of influenza from Nasopharyngeal swab specimens and should not be used as a sole basis for treatment. Nasal washings and aspirates are unacceptable for Xpert Xpress SARS-CoV-2/FLU/RSV testing.  Fact Sheet for Patients: BloggerCourse.com  Fact Sheet for Healthcare Providers: SeriousBroker.it  This test is not yet approved or cleared by the [place]  FDA and has been authorized for detection and/or diagnosis of SARS-CoV-2 by FDA under an Emergency Use Authorization (EUA). This EUA will remain in effect (meaning this test can be used) for the duration of the COVID-19 declaration under Section 564(b)(1) of the Act, 21 U.S.C. section 360bbb-3(b)(1), unless the authorization is terminated or revoked.     Resp Syncytial Virus by PCR NEGATIVE NEGATIVE Final    Comment:  (NOTE) Fact Sheet for Patients: BloggerCourse.com  Fact Sheet for Healthcare Providers: SeriousBroker.it  This test is not yet approved or cleared by the [place]  FDA and has been authorized for detection and/or diagnosis of SARS-CoV-2 by FDA under an Emergency Use Authorization (EUA). This EUA will remain in effect (meaning this test can be used) for the duration of the COVID-19 declaration under Section 564(b)(1) of the Act, 21 U.S.C. section 360bbb-3(b)(1), unless the authorization is terminated or revoked.  Performed at [facility], [address] [place], [place] [address]      Radiology Studies: DG Chest Portable 1 View Result Date: 12/22/2023 CLINICAL DATA:  Shortness of breath EXAM: PORTABLE CHEST 1 VIEW COMPARISON:  11/17/2023. FINDINGS: Unchanged cardiomegaly. Blunting of the costophrenic angle on the right is noted. Increase interstitial markings are noted bilaterally. No airspace consolidation, atelectasis or pneumothorax. IMPRESSION: Cardiomegaly and increased interstitial markings compatible with mild congestive heart failure. Electronically Signed   By: Burtis Case.D.  On: 12/22/2023 05:19     Scheduled Meds:  amLODipine   10 mg Oral Daily   carvedilol   25 mg Oral BID WC   Chlorhexidine  Gluconate Cloth  6 each Topical Q0600   furosemide   80 mg Oral TID   heparin   5,000 Units Subcutaneous Q8H   sodium chloride  flush  3 mL Intravenous Q12H   Continuous Infusions:   LOS: 0 days    Time spent:    Deforest Fast, MD [facility]   12/22/2023, 10:53 AM

## 2023-12-22 NOTE — Progress Notes (Signed)
 Received patient in bed to unit.  Alert and oriented.  Informed consent signed and in chart.   TX duration:3.5 hours  My coworker, Education administrator, on night shift checked patient's blood sugar and it was 30.  Audry Leavell RN had person eat and drink grape juice.  Blood sugar had come back up to 120.  Nicklas Barns LPN, rechecked blood sugar at end of session and it was 111.  Patient tolerated well.  Transported back to the room  Alert, without acute distress.  Hand-off given to patient's nurse.   Access used: Left AV Fistula upper arm Access issues: BFR turned down to 350  Total UF removed: 2.5L Medication(s) given: none   12/22/23 0740  Vitals  Temp (!) 97.5 F (36.4 C)  Temp Source Oral  BP (!) 174/129  MAP (mmHg) 144  BP Location Right Arm  BP Method Automatic  Patient Position (if appropriate) Lying  Pulse Rate Source Monitor  ECG Heart Rate 77  Resp 16  Oxygen Therapy  SpO2 100 %  O2 Device Nasal Cannula  O2 Flow Rate (L/min) 2 L/min  During Treatment Monitoring  Duration of HD Treatment -hour(s) 3.5 hour(s)  Cumulative Fluid Removed (mL) per Treatment  2500.2  HD Safety Checks Performed Yes  Intra-Hemodialysis Comments Tx completed  Dialysis Fluid Bolus Normal Saline  Bolus Amount (mL) 300 mL  Post Treatment  Dialyzer Clearance Clear  Liters Processed 77.5  Fluid Removed (mL) 2500 mL  Tolerated HD Treatment Yes  AVG/AVF Arterial Site Held (minutes) 7 minutes  AVG/AVF Venous Site Held (minutes) 7 minutes  Fistula / Graft Left Upper arm  No placement date or time found.   Orientation: Left  Access Location: Upper arm  Status Deaccessed     Luciano Ruths LPN [facility]

## 2023-12-22 NOTE — ED Notes (Signed)
 Patient taken to dialysis at this time

## 2023-12-22 NOTE — Consult Note (Addendum)
 Reason for Consult:ESRD Referring Physician: Dr. Alison Irvine  Chief Complaint: shortness of breath       Dialysis Orders: [facility]  on MWF . 180NRe 3hr BFR 350 DFR 500 EDW 71.5kg 2K 2Ca  AVG 15g  No heparin  Mircera 150mcg IV q 2 weeks- last dose 11/01/23 Hectorol  9mcg IV q HD Sensipar  180mg  PO q HD  Assessment/Plan: ESRD: atient missed dialysis x4 going to a music festival. Will dialyze emergently. Chest pain: resolved at present.  Hypertension/volume: home meds resumed and Bps coming down with UF. UF to EDW as able.  Anemia: Hgb 7.9;  will give ESA with HD today. Metabolic bone disease: Calcium  controlled. Continue hectorol , sensipar  and phos binders Generalized nodular lesions : diffuse to upper extremities/back/buttocks. Hidradenitis vs. Tophaceous gout. Unable to get a skin specimen on attempted biopsy  last admission. Referrals placed for both Derm and Rheum in outpatient Nutrition:  Will need renal diet/fluid restrictions   HPI: Linda [name] is an 28 y.o. female  with lupus, MCTD, hypertension, ESRD on dialysis who presented to the ED early this AM with shortness of breath after missing her last 4 dialysis treatments. She was out of town for a music festival and has been SOB for at least 2 days already with improvement with O2. Patient also has a nonproductive cough and left sided chest pain worse with palpation and movement without radiation. She denies fever, chills, nausea.   Compliance is an ongoing issue for her. Patient is on O2, K6.9 with peaked t-waves on EKG, CO2 15 and BUN/cr 86/ 14.95, Hb 7.9.  ROS Pertinent items are noted in HPI.  Chemistry and CBC: Creatinine, Ser  Date/Time Value Ref Range Status  12/22/2023 12:12 AM 14.95 (H) 0.44 - 1.00 mg/dL Final  40/98/1191 47:82 AM 4.68 (H) 0.44 - 1.00 mg/dL Final  95/62/1308 65:78 AM 8.11 (H) 0.44 - 1.00 mg/dL Final  46/96/2952 84:13 AM 11.22 (H) 0.44 - 1.00 mg/dL Final  24/40/1027 25:36 AM 8.12 (H) 0.44 - 1.00  mg/dL Final  64/40/3474 25:95 PM 15.92 (H) 0.44 - 1.00 mg/dL Final  63/87/5643 32:95 AM 14.90 (H) 0.44 - 1.00 mg/dL Final  18/84/1660 63:01 AM 6.85 (H) 0.44 - 1.00 mg/dL Final  60/05/9322 55:73 AM 10.88 (H) 0.44 - 1.00 mg/dL Final  22/10/5425 06:23 AM 9.16 (H) 0.44 - 1.00 mg/dL Final  76/28/3151 76:16 AM 6.34 (H) 0.44 - 1.00 mg/dL Final  07/37/1062 69:48 PM 12.90 (H) 0.44 - 1.00 mg/dL Final  54/62/7035 00:93 AM 12.34 (H) 0.44 - 1.00 mg/dL Final  81/82/9937 16:96 AM 7.96 (H) 0.44 - 1.00 mg/dL Final  78/93/8101 75:10 AM 12.50 (H) 0.44 - 1.00 mg/dL Final  25/85/2778 24:23 AM 11.10 (H) 0.44 - 1.00 mg/dL Final  53/61/4431 54:00 AM 6.03 (H) 0.44 - 1.00 mg/dL Final  86/76/1950 93:26 PM 9.59 (H) 0.44 - 1.00 mg/dL Final  71/24/5809 98:33 PM 9.34 (H) 0.44 - 1.00 mg/dL Final  82/50/5397 67:34 AM 12.17 (H) 0.44 - 1.00 mg/dL Final  19/37/9024 09:73 AM 11.29 (H) 0.44 - 1.00 mg/dL Final  53/29/9242 68:34 PM 11.33 (H) 0.44 - 1.00 mg/dL Final  19/62/2297 98:92 PM 4.94 (H) 0.44 - 1.00 mg/dL Final  11/94/1740 81:44 AM 11.82 (H) 0.44 - 1.00 mg/dL Final  81/85/6314 97:02 PM 7.06 (H) 0.44 - 1.00 mg/dL Final  63/78/5885 02:77 AM 4.71 (H) 0.44 - 1.00 mg/dL Final  41/28/7867 67:20 AM 6.62 (H) 0.44 - 1.00 mg/dL Final  94/70/9628 36:62 PM 6.46 (H) 0.44 - 1.00 mg/dL  Final  09/05/2021 01:30 AM 3.69 (H) 0.44 - 1.00 mg/dL Final  32/44/0102 72:53 AM 4.56 (H) 0.44 - 1.00 mg/dL Final  66/44/0347 42:59 AM 5.46 (H) 0.44 - 1.00 mg/dL Final  56/38/7564 33:29 PM 5.67 (H) 0.44 - 1.00 mg/dL Final  51/88/4166 06:30 PM 3.69 (H) 0.44 - 1.00 mg/dL Final  16/08/930 35:57 AM 3.51 (H) 0.44 - 1.00 mg/dL Final  32/20/2542 70:62 PM 2.37 (H) 0.44 - 1.00 mg/dL Final  37/62/8315 17:61 AM 2.12 (H) 0.44 - 1.00 mg/dL Final  60/73/7106 26:94 PM 2.59 (H) 0.44 - 1.00 mg/dL Final  85/46/2703 50:09 AM 1.57 (H) 0.44 - 1.00 mg/dL Final  38/18/2993 71:69 AM 1.53 (H) 0.44 - 1.00 mg/dL Final  67/89/3810 17:51 AM 1.35 (H) 0.44 - 1.00 mg/dL Final   02/58/5277 82:42 AM 1.37 (H) 0.44 - 1.00 mg/dL Final  35/36/1443 15:40 PM 1.45 (H) 0.44 - 1.00 mg/dL Final  08/67/6195 09:32 AM 1.48 (H) 0.44 - 1.00 mg/dL Final  67/08/4579 99:83 AM 1.57 (H) 0.44 - 1.00 mg/dL Final  38/25/0539 76:73 AM 1.47 (H) 0.44 - 1.00 mg/dL Final  41/93/7902 40:97 AM 1.58 (H) 0.44 - 1.00 mg/dL Final  35/32/9924 26:83 AM 1.21 (H) 0.44 - 1.00 mg/dL Final  41/96/2229 79:89 AM 1.52 (H) 0.44 - 1.00 mg/dL Final  21/19/4174 08:14 AM 1.37 (H) 0.44 - 1.00 mg/dL Final  48/18/5631 49:70 AM 1.19 (H) 0.44 - 1.00 mg/dL Final  26/37/8588 50:27 PM 1.26 (H) 0.44 - 1.00 mg/dL Final  74/07/8785 76:72 AM 1.26 (H) 0.44 - 1.00 mg/dL Final   Recent Labs  Lab 12/22/23 0012  NA 141  K 6.9*  CL 109  CO2 15*  GLUCOSE 76  BUN 86*  CREATININE 14.95*  CALCIUM  9.2   Recent Labs  Lab 12/22/23 0012  WBC 9.8  NEUTROABS 5.2  HGB 7.9*  HCT 26.5*  MCV 97.8  PLT 232   Liver Function Tests: Recent Labs  Lab 12/22/23 0012  AST 17  ALT 12  ALKPHOS 61  BILITOT 0.6  PROT 7.4  ALBUMIN  3.1*   No results for input(s): "LIPASE", "AMYLASE" in the last 168 hours. No results for input(s): "AMMONIA" in the last 168 hours. Cardiac Enzymes: No results for input(s): "CKTOTAL", "CKMB", "CKMBINDEX", "TROPONINI" in the last 168 hours. Iron  Studies: No results for input(s): "IRON ", "TIBC", "TRANSFERRIN", "FERRITIN" in the last 72 hours. PT/INR: @LABRCNTIP (inr:5)  Xrays/Other Studies: ) Results for orders placed or performed during the hospital encounter of 12/21/23 (from the past 48 hours)  CBC with Differential     Status: Abnormal   Collection Time: 12/22/23 12:12 AM  Result Value Ref Range   WBC 9.8 4.0 - 10.5 K/uL   RBC 2.71 (L) 3.87 - 5.11 MIL/uL   Hemoglobin 7.9 (L) 12.0 - 15.0 g/dL   HCT 09.4 (L) 70.9 - 62.8 %   MCV 97.8 80.0 - 100.0 fL   MCH 29.2 26.0 - 34.0 pg   MCHC 29.8 (L) 30.0 - 36.0 g/dL   RDW 36.6 (H) 29.4 - 76.5 %   Platelets 232 150 - 400 K/uL   nRBC 0.0 0.0 - 0.2  %   Neutrophils Relative % 53 %   Neutro Abs 5.2 1.7 - 7.7 K/uL   Lymphocytes Relative 33 %   Lymphs Abs 3.3 0.7 - 4.0 K/uL   Monocytes Relative 8 %   Monocytes Absolute 0.8 0.1 - 1.0 K/uL   Eosinophils Relative 5 %   Eosinophils Absolute 0.5 0.0 - 0.5 K/uL  Basophils Relative 1 %   Basophils Absolute 0.1 0.0 - 0.1 K/uL   Immature Granulocytes 0 %   Abs Immature Granulocytes 0.03 0.00 - 0.07 K/uL    Comment: Performed at [facility], [address], [place], [place] [address]  Comprehensive metabolic panel     Status: Abnormal   Collection Time: 12/22/23 12:12 AM  Result Value Ref Range   Sodium 141 135 - 145 mmol/L   Potassium 6.9 (HH) 3.5 - 5.1 mmol/L    Comment: CRITICAL RESULT CALLED TO, READ BACK BY AND VERIFIED WITH Arlo Berber, RN 0110 12/22/2023 SANDOVAL K   Chloride 109 98 - 111 mmol/L   CO2 15 (L) 22 - 32 mmol/L   Glucose, Bld 76 70 - 99 mg/dL    Comment: Glucose reference range applies only to samples taken after fasting for at least 8 hours.   BUN 86 (H) 6 - 20 mg/dL   Creatinine, Ser 60.45 (H) 0.44 - 1.00 mg/dL   Calcium  9.2 8.9 - 10.3 mg/dL   Total Protein 7.4 6.5 - 8.1 g/dL   Albumin  3.1 (L) 3.5 - 5.0 g/dL   AST 17 15 - 41 U/L   ALT 12 0 - 44 U/L   Alkaline Phosphatase 61 38 - 126 U/L   Total Bilirubin 0.6 0.0 - 1.2 mg/dL   GFR, Estimated 3 (L) >60 mL/min    Comment: (NOTE) Calculated using the CKD-EPI Creatinine Equation (2021)    Anion gap 17 (H) 5 - 15    Comment: Performed at [facility], [address], [place], [place] [address]  Troponin I (High Sensitivity)     Status: Abnormal   Collection Time: 12/22/23 12:12 AM  Result Value Ref Range   Troponin I (High Sensitivity) 36 (H) <18 ng/L    Comment: (NOTE) Elevated high sensitivity troponin I (hsTnI) values and significant  changes across serial measurements may suggest ACS but many other  chronic and acute conditions are known to elevate hsTnI results.  Refer to the "Links" section for  chest pain algorithms and additional  guidance. Performed at [facility], [address], [place], [place] [address]   Brain natriuretic peptide     Status: Abnormal   Collection Time: 12/22/23 12:12 AM  Result Value Ref Range   B Natriuretic Peptide >4,500.0 (H) 0.0 - 100.0 pg/mL    Comment: Performed at [facility], [address] [place], [place] [address]  D-dimer, quantitative     Status: Abnormal   Collection Time: 12/22/23 12:12 AM  Result Value Ref Range   D-Dimer, Quant 2.44 (H) 0.00 - 0.50 ug/mL-FEU    Comment: (NOTE) At the manufacturer cut-off value of 0.5 g/mL FEU, this assay has a negative predictive value of 95-100%.This assay is intended for use in conjunction with a clinical pretest probability (PTP) assessment model to exclude pulmonary embolism (PE) and deep venous thrombosis (DVT) in outpatients suspected of PE or DVT. Results should be correlated with clinical presentation. Performed at [facility], [address], [place], [place] [address]    No results found.  PMH:   Past Medical History:  Diagnosis Date   Abnormal ECG    a. In setting of LVH w/ repolarization abnormalities; b. 03/2019 MV: No ischemia/infarct. EF 55-65%.   Angioedema    ESRD (end stage renal disease) (HCC)    on HD (M,W,F)   GERD (gastroesophageal reflux disease)    Hypertension  Lupus    LVH (left ventricular hypertrophy)    a. 11/2017 Echo: EF 50-55%. triv AI. Mild MR. Mod L and mild R atrial enlargement. Nl RV size/fxn. Small pericardial effusion w/o tampondae; b. 03/2018 Echo: EF 55-60%, restrictive filling pattern. Nl RV size/fxn. Sev LAE. Mild MR, mild to mod TR/PR. Mod PAH. Triv effusion.   Migraines    Mixed connective tissue disease (HCC)    Previous Dialysis patient [facility])    a. Off HD since late 2020.   Septic prepatellar bursitis of right knee 08/19/2017    PSH:   Past Surgical History:  Procedure Laterality Date   GRAFT APPLICATION Left  03/30/2019   Procedure: BRACHIAL CEPHALIC GRAFT CREATION;  Surgeon: Celso College, MD;  Location: [facility] ORS;  Service: Vascular;  Laterality: Left;   I & D EXTREMITY Right 08/06/2017   Procedure: IRRIGATION AND DEBRIDEMENT KNEE;  Surgeon: Laneta Pintos, MD;  Location: [facility] OR;  Service: Orthopedics;  Laterality: Right;   IR THROMBECTOMY AV FISTULA W/THROMBOLYSIS/PTA/STENT INC/SHUNT/IMG LT Left 05/18/2022   IR US  GUIDE VASC ACCESS LEFT  05/18/2022   MULTIPLE TOOTH EXTRACTIONS      Allergies:  Allergies  Allergen Reactions   Iodine  Hives and Shortness Of Breath   Lisinopril  Anaphylaxis    Angioedema   Gabapentin Other (See Comments)    Reaction: Tremor (intolerance); tremor   Ativan  [Lorazepam ] Other (See Comments)    Somnolent with 1mg  ativan  at [facility] Nov 2019   Hydroxychloroquine  Other (See Comments)    Pt reports making her skin peel really bad   Vicodin [Hydrocodone -Acetaminophen ] Itching and Rash    Medications:   Prior to Admission medications   Medication Sig Start Date End Date Taking? Authorizing Provider  acetaminophen  (TYLENOL ) 325 MG tablet Take 2 tablets (650 mg total) by mouth every 6 (six) hours as needed for mild pain (pain score 1-3) or moderate pain (pain score 4-6) (or Fever >/= 101). Patient not taking: Reported on 11/14/2023 09/13/23   Casey Clay, MD  amLODipine  (NORVASC ) 10 MG tablet Take 10 mg by mouth daily. 08/05/21   [provider]  calcitRIOL  (ROCALTROL ) 0.25 MCG capsule Take 1 tablet by mouth daily.    [provider]  camphor-menthol  Eyvonne Hollering) lotion Apply topically as needed for itching. Patient not taking: Reported on 11/14/2023 10/28/23   Loma Rising, MD  carvedilol  (COREG ) 25 MG tablet Take 1 tablet (25 mg total) by mouth 2 (two) times daily with a meal. 09/03/23   Verlie Glisson, MD  Darbepoetin Alfa  (ARANESP ) 100 MCG/0.5ML SOSY injection Inject 0.5 mLs (100 mcg total) into the skin every Thursday at 6pm. Patient not  taking: Reported on 11/25/2023 11/18/23   Lavada Porteous, DO  furosemide  (LASIX ) 80 MG tablet Take 80 mg by mouth 3 (three) times daily. 07/30/23   [provider]  irbesartan  (AVAPRO ) 300 MG tablet Take 1 tablet (300 mg total) by mouth daily. 10/27/23   Gonfa, Taye T, MD  lidocaine -prilocaine  (EMLA ) cream Apply 1 Application topically. 10/20/23   [provider]  medroxyPROGESTERone  (DEPO-PROVERA ) 150 MG/ML injection Inject 1 mL (150 mg total) into the muscle every 3 (three) months. 10/29/22   Gabrielle Joiner, MD  naloxone [facility]) nasal spray 4 mg/0.1 mL Place 1 spray into the nose once. If needed for emergency overdose.    [provider]  nitroGLYCERIN  (NITROSTAT ) 0.4 MG SL tablet Place 1 tablet (0.4 mg total) under the tongue every 5 (five) minutes as needed for chest  pain. 10/28/23   Rai, Hurman Maiden, MD  ondansetron  (ZOFRAN -ODT) 4 MG disintegrating tablet Take 1 tablet (4 mg total) by mouth every 8 (eight) hours as needed for nausea or vomiting. 10/28/23   Rai, Hurman Maiden, MD  oxyCODONE  (ROXICODONE ) 15 MG immediate release tablet Take 1 tablet (15 mg total) by mouth every 6 (six) hours as needed for pain. 11/19/23   Omar Bibber, DO  pantoprazole  (PROTONIX ) 40 MG tablet Take 1 tablet (40 mg total) by mouth daily as needed (for acid reflux). 09/13/23 11/14/23  Hongalgi, Anand D, MD  sodium bicarbonate  650 MG tablet Take 1 tablet (650 mg total) by mouth 2 (two) times daily. 09/05/21   Singh, Prashant K, MD  tretinoin  (RETIN-A ) 0.025 % cream Apply 1 application  topically at bedtime.    [provider]  Vitamin D, Ergocalciferol, (DRISDOL) 1.25 MG (50000 UNIT) CAPS capsule ergocalciferol (vitamin D2) 1,250 mcg (50,000 unit) capsule TAKE 1 CAPSULE BY MOUTH WEEKLY 03/11/21   [provider]    Discontinued Meds:  There are no discontinued medications.  Social History:  reports that she has been smoking e-cigarettes. She started smoking about 8 years ago. She has  a 2.2 pack-year smoking history. She has never used smokeless tobacco. She reports that she does not drink alcohol and does not use drugs.  Family History:   Family History  Problem Relation Age of Onset   Lupus Mother    Hypertension Mother    Kidney disease Mother    Heart Problems Mother    Coronary artery disease Mother 34       stent   Diabetes Maternal Grandmother     Blood pressure (!) 141/102, pulse 66, temperature 98.4 F (36.9 C), temperature source Oral, resp. rate 16, height 5\' 6"  (1.676 m), weight 77.1 kg, SpO2 100%. General: Alert, nad, on room air  Heart: RRR  Lungs: rales present Abdomen: Soft and non-tender Extremities: No LE edema Dialysis Access: L AVG (+) B/T        Dorcas Melito, Alveda Aures, MD 12/22/2023, 2:02 AM

## 2023-12-22 NOTE — Progress Notes (Signed)
   12/22/23 0330  Neurological  Level of Consciousness Responds to Voice   Pt present to Dialysis complaining feeling Hot. Pt was sweating, skin color WNL . Glucose level 30 mg/dl and 32 mg/dl. Grape juice given, crackers. Pt states feeling better.0550 hrs  126mg /dl.

## 2023-12-22 NOTE — ED Notes (Signed)
 Pt care taken, wanted some crackers and pain medication for pain in the back and legs. Alert and oriented x 4. No other complaints.

## 2023-12-22 NOTE — ED Notes (Signed)
 Offered tylenol  for HA, pt refused.

## 2023-12-23 DIAGNOSIS — E875 Hyperkalemia: Secondary | ICD-10-CM | POA: Diagnosis not present

## 2023-12-23 LAB — RENAL FUNCTION PANEL
Albumin: 3.1 g/dL — ABNORMAL LOW (ref 3.5–5.0)
Anion gap: 19 — ABNORMAL HIGH (ref 5–15)
BUN: 51 mg/dL — ABNORMAL HIGH (ref 6–20)
CO2: 22 mmol/L (ref 22–32)
Calcium: 9 mg/dL (ref 8.9–10.3)
Chloride: 93 mmol/L — ABNORMAL LOW (ref 98–111)
Creatinine, Ser: 10.25 mg/dL — ABNORMAL HIGH (ref 0.44–1.00)
GFR, Estimated: 5 mL/min — ABNORMAL LOW (ref >60)
Glucose, Bld: 89 mg/dL (ref 70–99)
Phosphorus: 7.5 mg/dL — ABNORMAL HIGH (ref 2.5–4.6)
Potassium: 5 mmol/L (ref 3.5–5.1)
Sodium: 134 mmol/L — ABNORMAL LOW (ref 135–145)

## 2023-12-23 LAB — CBC
HCT: 29 % — ABNORMAL LOW (ref 36.0–46.0)
HCT: 28.3 % — ABNORMAL LOW (ref 36.0–46.0)
Hemoglobin: 8.9 g/dL — ABNORMAL LOW (ref 12.0–15.0)
Hemoglobin: 8.8 g/dL — ABNORMAL LOW (ref 12.0–15.0)
MCH: 28.9 pg (ref 26.0–34.0)
MCH: 28.7 pg (ref 26.0–34.0)
MCHC: 30.7 g/dL (ref 30.0–36.0)
MCHC: 31.1 g/dL (ref 30.0–36.0)
MCV: 94.2 fL (ref 80.0–100.0)
MCV: 92.2 fL (ref 80.0–100.0)
Platelets: 239 10*3/uL (ref 150–400)
Platelets: 224 10*3/uL (ref 150–400)
RBC: 3.08 MIL/uL — ABNORMAL LOW (ref 3.87–5.11)
RBC: 3.07 MIL/uL — ABNORMAL LOW (ref 3.87–5.11)
RDW: 17 % — ABNORMAL HIGH (ref 11.5–15.5)
RDW: 16.9 % — ABNORMAL HIGH (ref 11.5–15.5)
WBC: 7 10*3/uL (ref 4.0–10.5)
WBC: 8.4 10*3/uL (ref 4.0–10.5)
nRBC: 0 % (ref 0.0–0.2)
nRBC: 0 % (ref 0.0–0.2)

## 2023-12-23 LAB — BASIC METABOLIC PANEL WITH GFR
Anion gap: 17 — ABNORMAL HIGH (ref 5–15)
BUN: 39 mg/dL — ABNORMAL HIGH (ref 6–20)
CO2: 25 mmol/L (ref 22–32)
Calcium: 9.3 mg/dL (ref 8.9–10.3)
Chloride: 95 mmol/L — ABNORMAL LOW (ref 98–111)
Creatinine, Ser: 8.54 mg/dL — ABNORMAL HIGH (ref 0.44–1.00)
GFR, Estimated: 6 mL/min — ABNORMAL LOW (ref >60)
Glucose, Bld: 78 mg/dL (ref 70–99)
Potassium: 5 mmol/L (ref 3.5–5.1)
Sodium: 137 mmol/L (ref 135–145)

## 2023-12-23 MED ORDER — PENTAFLUOROPROP-TETRAFLUOROETH EX AERO: 1.0000 | INHALATION_SPRAY | CUTANEOUS | Status: DC | PRN

## 2023-12-23 MED ORDER — HEPARIN SODIUM (PORCINE) 1000 UNIT/ML DIALYSIS: 1000.0000 [IU] | INTRAMUSCULAR | Status: DC | PRN

## 2023-12-23 MED ORDER — FUROSEMIDE 80 MG PO TABS
80.0000 mg | ORAL_TABLET | Freq: Every day | ORAL | Status: DC
Start: 2023-12-23 — End: 2024-06-12

## 2023-12-23 MED ORDER — PENTAFLUOROPROP-TETRAFLUOROETH EX AERO
1.0000 | INHALATION_SPRAY | CUTANEOUS | Status: DC | PRN
Start: 2023-12-23 — End: 2023-12-23

## 2023-12-23 MED ORDER — ALTEPLASE 2 MG IJ SOLR: 2.0000 mg | Freq: Once | INTRAMUSCULAR | Status: DC | PRN

## 2023-12-23 MED ORDER — LIDOCAINE HCL (PF) 1 % IJ SOLN: 5.0000 mL | INTRAMUSCULAR | Status: DC | PRN

## 2023-12-23 MED ORDER — CHLORHEXIDINE GLUCONATE CLOTH 2 % EX PADS
6.0000 | MEDICATED_PAD | Freq: Every day | CUTANEOUS | Status: DC
  Administered 2023-12-23 – 2023-12-24 (×2): 6 via TOPICAL

## 2023-12-23 MED ORDER — ANTICOAGULANT SODIUM CITRATE 4% (200MG/5ML) IV SOLN: 5.0000 mL | Status: DC | PRN

## 2023-12-23 MED ORDER — LIDOCAINE-PRILOCAINE 2.5-2.5 % EX CREA: 1.0000 | TOPICAL_CREAM | CUTANEOUS | Status: DC | PRN

## 2023-12-23 MED ORDER — HEPARIN SODIUM (PORCINE) 1000 UNIT/ML DIALYSIS: 20.0000 [IU]/kg | INTRAMUSCULAR | Status: DC | PRN

## 2023-12-23 MED ORDER — FLUCONAZOLE 100 MG PO TABS
100.0000 mg | ORAL_TABLET | Freq: Once | ORAL | Status: AC
  Administered 2023-12-23: 100 mg via ORAL
  Filled 2023-12-23: qty 1

## 2023-12-23 MED ORDER — LIDOCAINE HCL (PF) 1 % IJ SOLN
5.0000 mL | INTRAMUSCULAR | Status: DC | PRN
Start: 2023-12-23 — End: 2023-12-23

## 2023-12-23 MED ORDER — ANTICOAGULANT SODIUM CITRATE 4% (200MG/5ML) IV SOLN
5.0000 mL | Status: DC | PRN
Start: 2023-12-23 — End: 2023-12-23

## 2023-12-23 NOTE — ED Notes (Signed)
 Called for transport

## 2023-12-23 NOTE — Progress Notes (Signed)
 PROGRESS NOTE    Linda [name]  EAV:[id number] DOB: Feb 08, 1996 DOA: 12/21/2023 PCP: Kline, Chianne, PA-C   27/F w/ lupus, ESRD on HD MWF hypertension, anemia, chronic HFmrEF, and poor adherence with her treatment plan, presented to the ED with shortness of breath after missing her last 4 dialysis appointments. In the ED tachypneic, tachycardic, hypoxic.  Labs are most notable for potassium 6.9, bicarbonate 15, anion gap 17, BUN 86, hemoglobin 7.9, D-dimer 2.44, and BNP >4500.  Chest x-ray is concerning for CHF.  Subjective: - Still complains of dyspnea with any activity  Assessment and Plan:  ESRD; hyperkalemia; hypervolemia; metabolic acidosis  - Nephrology consulting, sp urgent dialysis yesterday - Improving, still with some volume overload - Await nephrology eval regarding repeat HD versus DC with outpatient dialysis tomorrow   Acute on chronic HFmrEF; acute hypoxic respiratory failure  -Last echo 2/25 with a EF 40-45%, normal RV - Given Lasix  in ED, sp urgent HD yesterday -Remains on oral Lasix , still makes some urine - Continue Coreg , hold ARB    Hypertensive urgency  - Improving, continue Coreg  and amlodipine     Anemia  - No overt bleeding  - ESA per nephrology, check anemia panel with a.m. labs   Elevated d-dimer  - There is no leg swelling or tenderness, chest pain has resolved  - Do not suspect acute PE/DVT at this time   DVT prophylaxis: sq heparin   Code Status: Full  Level of Care: Level of care: Progressive Family Communication: None present  Disposition Plan: Home tomorrow after HD  Consultants: Nephrology   Procedures:   Antimicrobials:    Objective: Vitals:   12/23/23 0915 12/23/23 0959 12/23/23 1001 12/23/23 1115  BP:  (!) 144/98 (!) 144/98   Pulse: 83   76  Resp: 18  (!) 27 20  Temp:      TempSrc:      SpO2: 100%   100%  Weight:      Height:       No intake or output data in the 24 hours ending 12/23/23 1136  Filed Weights    12/21/23 2349  Weight: 77.1 kg    Examination:  General exam: Appears calm and comfortable  HEENT: Positive JVD Respiratory system: Basilar Rales Cardiovascular system: S1 & S2 heard, RRR.  Abd: nondistended, soft and nontender.Normal bowel sounds heard. Central nervous system: Alert and oriented. No focal neurological deficits. Extremities: Trace edema Skin: No rashes Psychiatry:  Mood & affect appropriate.     Data Reviewed:   CBC: Recent Labs  Lab 12/22/23 0012 12/23/23 0522  WBC 9.8 7.0  NEUTROABS 5.2  --   HGB 7.9* 8.9*  HCT 26.5* 29.0*  MCV 97.8 94.2  PLT 232 239   Basic Metabolic Panel: Recent Labs  Lab 12/22/23 0012 12/22/23 0912 12/23/23 0522  NA 141 135 137  K 6.9* 4.6 5.0  CL 109 96* 95*  CO2 15* 26 25  GLUCOSE 76 90 78  BUN 86* 30* 39*  CREATININE 14.95* 6.03* 8.54*  CALCIUM  9.2 8.5* 9.3  PHOS  --  4.1  --    GFR: Estimated Creatinine Clearance: 10.3 mL/min (A) (by C-G formula based on SCr of 8.54 mg/dL (H)). Liver Function Tests: Recent Labs  Lab 12/22/23 0012 12/22/23 0912  AST 17  --   ALT 12  --   ALKPHOS 61  --   BILITOT 0.6  --   PROT 7.4  --   ALBUMIN  3.1* 2.9*   No results for  input(s): "LIPASE", "AMYLASE" in the last 168 hours. No results for input(s): "AMMONIA" in the last 168 hours. Coagulation Profile: No results for input(s): "INR", "PROTIME" in the last 168 hours. Cardiac Enzymes: No results for input(s): "CKTOTAL", "CKMB", "CKMBINDEX", "TROPONINI" in the last 168 hours. BNP (last 3 results) No results for input(s): "PROBNP" in the last 8760 hours. HbA1C: No results for input(s): "HGBA1C" in the last 72 hours. CBG: Recent Labs  Lab 12/22/23 0414 12/22/23 0417 12/22/23 0552 12/22/23 0724  GLUCAP 30* 32* 126* 111*   Lipid Profile: No results for input(s): "CHOL", "HDL", "LDLCALC", "TRIG", "CHOLHDL", "LDLDIRECT" in the last 72 hours. Thyroid Function Tests: No results for input(s): "TSH", "T4TOTAL", "FREET4",  "T3FREE", "THYROIDAB" in the last 72 hours. Anemia Panel: No results for input(s): "VITAMINB12", "FOLATE", "FERRITIN", "TIBC", "IRON ", "RETICCTPCT" in the last 72 hours. Urine analysis:    Component Value Date/Time   COLORURINE STRAW (A) 11/15/2023 1835   APPEARANCEUR CLEAR 11/15/2023 1835   LABSPEC 1.006 11/15/2023 1835   PHURINE 8.0 11/15/2023 1835   GLUCOSEU 50 (A) 11/15/2023 1835   HGBUR NEGATIVE 11/15/2023 1835   BILIRUBINUR NEGATIVE 11/15/2023 1835   BILIRUBINUR Negative 02/10/2018 1602   KETONESUR NEGATIVE 11/15/2023 1835   PROTEINUR 100 (A) 11/15/2023 1835   UROBILINOGEN 0.2 02/10/2018 1602   UROBILINOGEN 1.0 03/30/2015 2328   NITRITE NEGATIVE 11/15/2023 1835   LEUKOCYTESUR NEGATIVE 11/15/2023 1835   Sepsis Labs: @LABRCNTIP (procalcitonin:4,lacticidven:4)  ) Recent Results (from the past 240 hours)  Resp panel by RT-PCR (RSV, Flu A&B, Covid) Anterior Nasal Swab     Status: None   Collection Time: 12/21/23 11:51 PM   Specimen: Anterior Nasal Swab  Result Value Ref Range Status   SARS Coronavirus 2 by RT PCR NEGATIVE NEGATIVE Final   Influenza A by PCR NEGATIVE NEGATIVE Final   Influenza B by PCR NEGATIVE NEGATIVE Final    Comment: (NOTE) The Xpert Xpress SARS-CoV-2/FLU/RSV plus assay is intended as an aid in the diagnosis of influenza from Nasopharyngeal swab specimens and should not be used as a sole basis for treatment. Nasal washings and aspirates are unacceptable for Xpert Xpress SARS-CoV-2/FLU/RSV testing.  Fact Sheet for Patients: BloggerCourse.com  Fact Sheet for Healthcare Providers: SeriousBroker.it  This test is not yet approved or cleared by the [place]  FDA and has been authorized for detection and/or diagnosis of SARS-CoV-2 by FDA under an Emergency Use Authorization (EUA). This EUA will remain in effect (meaning this test can be used) for the duration of the COVID-19 declaration under Section  564(b)(1) of the Act, 21 U.S.C. section 360bbb-3(b)(1), unless the authorization is terminated or revoked.     Resp Syncytial Virus by PCR NEGATIVE NEGATIVE Final    Comment: (NOTE) Fact Sheet for Patients: BloggerCourse.com  Fact Sheet for Healthcare Providers: SeriousBroker.it  This test is not yet approved or cleared by the [place]  FDA and has been authorized for detection and/or diagnosis of SARS-CoV-2 by FDA under an Emergency Use Authorization (EUA). This EUA will remain in effect (meaning this test can be used) for the duration of the COVID-19 declaration under Section 564(b)(1) of the Act, 21 U.S.C. section 360bbb-3(b)(1), unless the authorization is terminated or revoked.  Performed at [facility], [address] [place], [place] [address]      Radiology Studies: DG Chest Portable 1 View Result Date: 12/22/2023 CLINICAL DATA:  Shortness of breath EXAM: PORTABLE CHEST 1 VIEW COMPARISON:  11/17/2023. FINDINGS: Unchanged cardiomegaly. Blunting of the costophrenic angle on the right is  noted. Increase interstitial markings are noted bilaterally. No airspace consolidation, atelectasis or pneumothorax. IMPRESSION: Cardiomegaly and increased interstitial markings compatible with mild congestive heart failure. Electronically Signed   By: Kimberley Penman M.D.   On: 12/22/2023 05:19     Scheduled Meds:  amLODipine   10 mg Oral Daily   carvedilol   25 mg Oral BID WC   Chlorhexidine  Gluconate Cloth  6 each Topical Q0600   furosemide   80 mg Oral Daily   heparin   5,000 Units Subcutaneous Q8H   sodium chloride  flush  3 mL Intravenous Q12H   Continuous Infusions:   LOS: 1 day    Time spent:    Deforest Fast, MD [facility]   12/23/2023, 11:36 AM

## 2023-12-23 NOTE — ED Notes (Signed)
 Assumed care of pt, found her asleep observing the slow and steady rise and fall of her chest.  No distress noted.

## 2023-12-23 NOTE — Progress Notes (Signed)
 Patient arrived at the unit from ED,Chg bath given,vitals checked,CCMD notified, pt oriented to the unit

## 2023-12-23 NOTE — Progress Notes (Signed)
 [facility] NEPHROLOGY PROGRESS NOTE  Assessment/ Plan: Pt is a 28 y.o. yo female  with lupus, MCTD, hypertension, ESRD on dialysis who presented to the ED early this AM with shortness of breath after missing her last 4 dialysis treatments.  Dialysis Orders: [facility]  on MWF . 180NRe 3hr BFR 350 DFR 500 EDW 71.5kg 2K 2Ca  AVG 15g  No heparin  Mircera 150mcg IV q 2 weeks- last dose 11/01/23 Hectorol  9mcg IV q HD Sensipar  180mg  PO q HD  # ESRD: patient missed dialysis x4 going to a music festival.  Received urgent dialysis yesterday for hyperkalemia.  Plan for regular HD today mainly for ultrafiltration.  She is MWF schedule.  Ok to discharge after dialysis today from renal perspective.  #Hypertension/volume: home meds resumed and Bps coming down with UF. UF to EDW as able.  #Anemia: Hgb 7.9;   ESA with HD . #Metabolic bone disease: Calcium  controlled. Continue hectorol , sensipar  and phos binders # Hyperkalemia improved after dialysis. Discussed with the primary team.   Subjective: Seen and examined.  Patient reported mild shortness of breath.  Denies nausea, vomiting, chest pain.  No new event overnight. Objective Vital signs in last 24 hours: Vitals:   12/23/23 0959 12/23/23 1001 12/23/23 1115 12/23/23 1142  BP: (!) 144/98 (!) 144/98  (!) 145/113  Pulse:   76 74  Resp:  (!) 27 20 15   Temp:    98.6 F (37 C)  TempSrc:    Oral  SpO2:   100% 100%  Weight:      Height:       Weight change:  No intake or output data in the 24 hours ending 12/23/23 1145     Labs: RENAL PANEL Recent Labs  Lab 12/22/23 0012 12/22/23 0912 12/23/23 0522  NA 141 135 137  K 6.9* 4.6 5.0  CL 109 96* 95*  CO2 15* 26 25  GLUCOSE 76 90 78  BUN 86* 30* 39*  CREATININE 14.95* 6.03* 8.54*  CALCIUM  9.2 8.5* 9.3  PHOS  --  4.1  --   ALBUMIN  3.1* 2.9*  --     Liver Function Tests: Recent Labs  Lab 12/22/23 0012 12/22/23 0912  AST 17  --   ALT 12  --   ALKPHOS 61  --    BILITOT 0.6  --   PROT 7.4  --   ALBUMIN  3.1* 2.9*   No results for input(s): "LIPASE", "AMYLASE" in the last 168 hours. No results for input(s): "AMMONIA" in the last 168 hours. CBC: Recent Labs    11/15/23 0745 11/17/23 0400 11/18/23 1023 12/22/23 0012 12/23/23 0522  HGB 10.6* 9.7* 10.2* 7.9* 8.9*  MCV 95.2 97.2 95.9 97.8 94.2    Cardiac Enzymes: No results for input(s): "CKTOTAL", "CKMB", "CKMBINDEX", "TROPONINI" in the last 168 hours. CBG: Recent Labs  Lab 12/22/23 0414 12/22/23 0417 12/22/23 0552 12/22/23 0724  GLUCAP 30* 32* 126* 111*    Iron  Studies: No results for input(s): "IRON ", "TIBC", "TRANSFERRIN", "FERRITIN" in the last 72 hours. Studies/Results: DG Chest Portable 1 View Result Date: 12/22/2023 CLINICAL DATA:  Shortness of breath EXAM: PORTABLE CHEST 1 VIEW COMPARISON:  11/17/2023. FINDINGS: Unchanged cardiomegaly. Blunting of the costophrenic angle on the right is noted. Increase interstitial markings are noted bilaterally. No airspace consolidation, atelectasis or pneumothorax. IMPRESSION: Cardiomegaly and increased interstitial markings compatible with mild congestive heart failure. Electronically Signed   By: Kimberley Penman M.D.   On: 12/22/2023 05:19    Medications:  Infusions:   Scheduled Medications:  amLODipine   10 mg Oral Daily   carvedilol   25 mg Oral BID WC   Chlorhexidine  Gluconate Cloth  6 each Topical Q0600   Chlorhexidine  Gluconate Cloth  6 each Topical Q0600   furosemide   80 mg Oral Daily   heparin   5,000 Units Subcutaneous Q8H   sodium chloride  flush  3 mL Intravenous Q12H    have reviewed scheduled and prn medications.  Physical Exam: General:NAD, comfortable Heart:RRR, s1s2 nl Lungs:clear b/l, no crackle Abdomen:soft, Non-tender, non-distended Extremities: Trace peripheral edema present  Dialysis Access: AV graft with good thrill and bruit.  Linda [name] Linda [name] 12/23/2023,11:45 AM  LOS: 1 day

## 2023-12-24 DIAGNOSIS — E875 Hyperkalemia: Secondary | ICD-10-CM | POA: Diagnosis not present

## 2023-12-24 LAB — CBC
HCT: 28.2 % — ABNORMAL LOW (ref 36.0–46.0)
Hemoglobin: 8.9 g/dL — ABNORMAL LOW (ref 12.0–15.0)
MCH: 28.8 pg (ref 26.0–34.0)
MCHC: 31.6 g/dL (ref 30.0–36.0)
MCV: 91.3 fL (ref 80.0–100.0)
Platelets: 235 10*3/uL (ref 150–400)
RBC: 3.09 MIL/uL — ABNORMAL LOW (ref 3.87–5.11)
RDW: 16.4 % — ABNORMAL HIGH (ref 11.5–15.5)
WBC: 9 10*3/uL (ref 4.0–10.5)
nRBC: 0 % (ref 0.0–0.2)

## 2023-12-24 LAB — BASIC METABOLIC PANEL WITH GFR
Anion gap: 14 (ref 5–15)
BUN: 23 mg/dL — ABNORMAL HIGH (ref 6–20)
CO2: 27 mmol/L (ref 22–32)
Calcium: 9.7 mg/dL (ref 8.9–10.3)
Chloride: 95 mmol/L — ABNORMAL LOW (ref 98–111)
Creatinine, Ser: 5.51 mg/dL — ABNORMAL HIGH (ref 0.44–1.00)
GFR, Estimated: 10 mL/min — ABNORMAL LOW (ref >60)
Glucose, Bld: 84 mg/dL (ref 70–99)
Potassium: 3.7 mmol/L (ref 3.5–5.1)
Sodium: 136 mmol/L (ref 135–145)

## 2023-12-24 LAB — HEPATITIS B SURFACE ANTIBODY, QUANTITATIVE: Hep B S AB Quant (Post): 6.2 m[IU]/mL — ABNORMAL LOW

## 2023-12-24 LAB — IRON AND TIBC
Iron: 58 ug/dL (ref 28–170)
Saturation Ratios: 23 % (ref 10.4–31.8)
TIBC: 251 ug/dL (ref 250–450)
UIBC: 193 ug/dL

## 2023-12-24 LAB — VITAMIN B12: Vitamin B-12: 419 pg/mL (ref 180–914)

## 2023-12-24 LAB — FERRITIN: Ferritin: 2764 ng/mL — ABNORMAL HIGH (ref 11–307)

## 2023-12-24 LAB — RETICULOCYTES
Immature Retic Fract: 15 % (ref 2.3–15.9)
RBC.: 3.12 MIL/uL — ABNORMAL LOW (ref 3.87–5.11)
Retic Count, Absolute: 18.7 10*3/uL — ABNORMAL LOW (ref 19.0–186.0)
Retic Ct Pct: 0.6 % (ref 0.4–3.1)

## 2023-12-24 LAB — FOLATE: Folate: 11.5 ng/mL (ref >5.9)

## 2023-12-24 MED ORDER — FLUCONAZOLE IN SODIUM CHLORIDE 200-0.9 MG/100ML-% IV SOLN
200.0000 mg | Freq: Once | INTRAVENOUS | Status: AC
  Administered 2023-12-24: 200 mg via INTRAVENOUS
  Filled 2023-12-24: qty 100

## 2023-12-24 MED ORDER — OXYCODONE HCL 10 MG PO TABS
10.0000 mg | ORAL_TABLET | Freq: Four times a day (QID) | ORAL | 0 refills | Status: DC | PRN
Start: 2023-12-24 — End: 2024-01-14

## 2023-12-24 NOTE — Discharge Planning (Signed)
 [facility] Patient Discharge Orders- [facility] CLINIC: [facility]  Patient's name: Linda [name] Admit/DC Dates: 12/21/2023 - 12/24/2023  Discharge Diagnoses: Hyperkalemia   Noncompliance with HD  Aranesp : Given: No  Date and amount of last dose: NA  PRBC's Given: No Date/# of units: NA Last Hgb: 8.9 ESA dose for discharge: mircera 225 mcg IV q 2 weeks  IV Iron  dose at discharge: continue venofer  load  Heparin  change: No  EDW Change: No New EDW: NA  [place] Change: No  Access intervention/Change: No Details:  Hectorol /Calcitriol  change: 7 mcg IV high corrected calcium   Discharge Labs: Calcium  corrected 10.7 Phosphorus 7.5 Albumin  3.1 K+ 3.7  IV Antibiotics: No Details:  On Coumadin?: No Last INR: Next INR: Managed By:   OTHER/APPTS/LAB ORDERS:    D/C Meds to be reconciled by nurse after every discharge.  Completed By: Jacobo Masters [facility] [phone]    Reviewed by: MD:______ RN_______

## 2023-12-24 NOTE — Progress Notes (Signed)
 D/C order noted. Case discussed with nephrologist and renal NP. Nephrologist felt it would be best for pt to have an out-pt HD appt tomorrow if clinic can accommodate, but if not, pt would be ok to wait until Monday for next treatment. Contacted [facility] to inquire if clinic has an appt available tomorrow (off day). Clinic does not have any availability for treatment tomorrow. Update provided to nephrologist and renal NP. Clinic advised pt will d/c today and should resume care on Monday.   Lauraine Polite Renal Navigator [phone]

## 2023-12-24 NOTE — Plan of Care (Signed)
  Problem: Education: Goal: Knowledge of General Education information will improve Description: Including pain rating scale, medication(s)/side effects and non-pharmacologic comfort measures Outcome: Progressing   Problem: Clinical Measurements: Goal: Will remain free from infection Outcome: Progressing Goal: Respiratory complications will improve Outcome: Progressing Goal: Cardiovascular complication will be avoided Outcome: Progressing   Problem: Activity: Goal: Risk for activity intolerance will decrease Outcome: Progressing   Problem: Nutrition: Goal: Adequate nutrition will be maintained Outcome: Progressing   Problem: Coping: Goal: Level of anxiety will decrease Outcome: Progressing   Problem: Clinical Measurements: Goal: Ability to maintain clinical measurements within normal limits will improve Outcome: Not Progressing   Problem: Pain Managment: Goal: General experience of comfort will improve and/or be controlled Outcome: Not Progressing

## 2023-12-24 NOTE — Discharge Summary (Signed)
 Physician Discharge Summary  DYAMON SOSINSKI QMV:[id number] DOB: [birth year] DOA: 12/21/2023  PCP: Kline, Chianne, PA-C  Admit date: 12/21/2023 Discharge date: 12/24/2023  Time spent: 45 minutes  Recommendations for Outpatient Follow-up:  Outpatient nephrology on Monday for hemodialysis   Discharge Diagnoses:  Principal Problem:   Hyperkalemia ESRD on hemodialysis Poor compliance   Hypertensive urgency   Volume overload   High anion gap metabolic acidosis   Acute hypoxic respiratory failure (HCC)   Acute on chronic heart failure with mildly reduced ejection fraction (HFmrEF, 41-49%) (HCC)   Positive D dimer Chronic pain, narcotic dependence  Discharge Condition: Improved  Diet recommendation: Renal  Filed Weights   12/21/23 2349  Weight: 77.1 kg    History of present illness:   27/F w/ lupus, ESRD on HD MWF hypertension, anemia, chronic HFmrEF, and poor adherence with her treatment plan, presented to the ED with shortness of breath after missing her last 4 dialysis appointments. In the ED tachypneic, tachycardic, hypoxic.  Labs are most notable for potassium 6.9, bicarbonate 15, anion gap 17, BUN 86, hemoglobin 7.9, D-dimer 2.44, and BNP >4500.  Chest x-ray is concerning for CHF  Hospital Course:   ESRD; hyperkalemia; hypervolemia; metabolic acidosis  -From missing 4 dialysis treatments - Nephrology consulting, sp urgent dialysis on admission, repeat dialysis last night - Volume status improving, discussed with nephrology today, recommended extra outpatient dialysis at her center tomorrow or just back on Monday if that is not feasible -Discharged home in stable condition   Acute on chronic HFmrEF; acute hypoxic respiratory failure  -Last echo 2/25 with a EF 40-45%, normal RV - Given Lasix  in ED, sp urgent HD yesterday -Remains on oral Lasix , still makes some urine - Continue Coreg , discontinued ARB  History of lupus, chronic pain Narcotic dependence -Followed by  pain clinic, few days supply of oxycodone  prescribed    Hypertensive urgency  - Improving, continue Coreg  and amlodipine     Anemia  - No overt bleeding  - ESA per nephrology, iron  stores adequate   Elevated d-dimer  - There is no leg swelling or tenderness, chest pain has resolved  - Do not suspect acute PE/DVT at this time  Discharge Exam: Vitals:   12/24/23 0802 12/24/23 0804  BP:  (!) 154/111  Pulse:    Resp: 20   Temp:    SpO2:     Gen: Awake, Alert, Oriented X 3,  HEENT: no JVD Lungs: Decreased breath sounds at the bases CVS: S1S2/RRR Abd: soft, Non tender, non distended, BS present Extremities: Trace edema Skin: no new rashes on exposed skin   Discharge Instructions   Discharge Instructions     Diet - low sodium heart healthy   Complete by: As directed    Increase activity slowly   Complete by: As directed       Allergies as of 12/24/2023       Reactions   Iodine  Hives, Shortness Of Breath   Lisinopril  Anaphylaxis   Angioedema   Gabapentin Other (See Comments)   Reaction: Tremor (intolerance); tremor   Hydroxychloroquine  Other (See Comments)   Pt reports making her skin peel really bad   Vicodin [hydrocodone -acetaminophen ] Itching, Rash        Medication List     STOP taking these medications    camphor-menthol  lotion Commonly known as: SARNA   irbesartan  300 MG tablet Commonly known as: AVAPRO    tretinoin  0.025 % cream Commonly known as: RETIN-A        TAKE these medications  amLODipine  10 MG tablet Commonly known as: NORVASC  Take 10 mg by mouth daily.   calcitRIOL  0.25 MCG capsule Commonly known as: ROCALTROL  Take 1 tablet by mouth every Monday, Wednesday, and Friday with hemodialysis.   carvedilol  25 MG tablet Commonly known as: COREG  Take 1 tablet (25 mg total) by mouth 2 (two) times daily with a meal.   furosemide  80 MG tablet Commonly known as: LASIX  Take 1 tablet (80 mg total) by mouth daily. What changed: when to  take this   medroxyPROGESTERone  150 MG/ML injection Commonly known as: DEPO-PROVERA  Inject 1 mL (150 mg total) into the muscle every 3 (three) months.   Narcan 4 MG/0.1ML Liqd nasal spray kit Generic drug: naloxone Place 1 spray into the nose once. If needed for emergency overdose.   nitroGLYCERIN  0.4 MG SL tablet Commonly known as: NITROSTAT  Place 1 tablet (0.4 mg total) under the tongue every 5 (five) minutes as needed for chest pain.   ondansetron  4 MG disintegrating tablet Commonly known as: ZOFRAN -ODT Take 1 tablet (4 mg total) by mouth every 8 (eight) hours as needed for nausea or vomiting.   Oxycodone  HCl 10 MG Tabs Take 1 tablet (10 mg total) by mouth every 6 (six) hours as needed for pain. What changed:  medication strength how much to take   sodium bicarbonate  650 MG tablet Take 1 tablet (650 mg total) by mouth 2 (two) times daily.   tiZANidine  4 MG tablet Commonly known as: ZANAFLEX  Take 4 mg by mouth every 8 (eight) hours as needed for muscle spasms.       Allergies  Allergen Reactions   Iodine  Hives and Shortness Of Breath   Lisinopril  Anaphylaxis    Angioedema   Gabapentin Other (See Comments)    Reaction: Tremor (intolerance); tremor   Hydroxychloroquine  Other (See Comments)    Pt reports making her skin peel really bad   Vicodin [Hydrocodone -Acetaminophen ] Itching and Rash    Follow-up Information     Kline, Chianne, PA-C Follow up.   Specialty: Physician Assistant Contact information: [address] [place] [address] [phone]                  The results of significant diagnostics from this hospitalization (including imaging, microbiology, ancillary and laboratory) are listed below for reference.    Significant Diagnostic Studies: DG Chest Portable 1 View Result Date: 12/22/2023 CLINICAL DATA:  Shortness of breath EXAM: PORTABLE CHEST 1 VIEW COMPARISON:  11/17/2023. FINDINGS: Unchanged cardiomegaly. Blunting of the  costophrenic angle on the right is noted. Increase interstitial markings are noted bilaterally. No airspace consolidation, atelectasis or pneumothorax. IMPRESSION: Cardiomegaly and increased interstitial markings compatible with mild congestive heart failure. Electronically Signed   By: Kimberley Penman M.D.   On: 12/22/2023 05:19    Microbiology: Recent Results (from the past 240 hours)  Resp panel by RT-PCR (RSV, Flu A&B, Covid) Anterior Nasal Swab     Status: None   Collection Time: 12/21/23 11:51 PM   Specimen: Anterior Nasal Swab  Result Value Ref Range Status   SARS Coronavirus 2 by RT PCR NEGATIVE NEGATIVE Final   Influenza A by PCR NEGATIVE NEGATIVE Final   Influenza B by PCR NEGATIVE NEGATIVE Final    Comment: (NOTE) The Xpert Xpress SARS-CoV-2/FLU/RSV plus assay is intended as an aid in the diagnosis of influenza from Nasopharyngeal swab specimens and should not be used as a sole basis for treatment. Nasal washings and aspirates are unacceptable for Xpert Xpress SARS-CoV-2/FLU/RSV testing.  Fact Sheet for Patients: BloggerCourse.com  Fact Sheet for Healthcare Providers: SeriousBroker.it  This test is not yet approved or cleared by the [place]  FDA and has been authorized for detection and/or diagnosis of SARS-CoV-2 by FDA under an Emergency Use Authorization (EUA). This EUA will remain in effect (meaning this test can be used) for the duration of the COVID-19 declaration under Section 564(b)(1) of the Act, 21 U.S.C. section 360bbb-3(b)(1), unless the authorization is terminated or revoked.     Resp Syncytial Virus by PCR NEGATIVE NEGATIVE Final    Comment: (NOTE) Fact Sheet for Patients: BloggerCourse.com  Fact Sheet for Healthcare Providers: SeriousBroker.it  This test is not yet approved or cleared by the [place]  FDA and has been authorized for detection  and/or diagnosis of SARS-CoV-2 by FDA under an Emergency Use Authorization (EUA). This EUA will remain in effect (meaning this test can be used) for the duration of the COVID-19 declaration under Section 564(b)(1) of the Act, 21 U.S.C. section 360bbb-3(b)(1), unless the authorization is terminated or revoked.  Performed at [facility], [address], [place], [place] [address]      Labs: Basic Metabolic Panel: Recent Labs  Lab 12/22/23 0012 12/22/23 0912 12/23/23 0522 12/23/23 2023 12/24/23 0403  NA 141 135 137 134* 136  K 6.9* 4.6 5.0 5.0 3.7  CL 109 96* 95* 93* 95*  CO2 15* 26 25 22 27   GLUCOSE 76 90 78 89 84  BUN 86* 30* 39* 51* 23*  CREATININE 14.95* 6.03* 8.54* 10.25* 5.51*  CALCIUM  9.2 8.5* 9.3 9.0 9.7  PHOS  --  4.1  --  7.5*  --    Liver Function Tests: Recent Labs  Lab 12/22/23 0012 12/22/23 0912 12/23/23 2023  AST 17  --   --   ALT 12  --   --   ALKPHOS 61  --   --   BILITOT 0.6  --   --   PROT 7.4  --   --   ALBUMIN  3.1* 2.9* 3.1*   No results for input(s): "LIPASE", "AMYLASE" in the last 168 hours. No results for input(s): "AMMONIA" in the last 168 hours. CBC: Recent Labs  Lab 12/22/23 0012 12/23/23 0522 12/23/23 2023 12/24/23 0403  WBC 9.8 7.0 8.4 9.0  NEUTROABS 5.2  --   --   --   HGB 7.9* 8.9* 8.8* 8.9*  HCT 26.5* 29.0* 28.3* 28.2*  MCV 97.8 94.2 92.2 91.3  PLT 232 239 224 235   Cardiac Enzymes: No results for input(s): "CKTOTAL", "CKMB", "CKMBINDEX", "TROPONINI" in the last 168 hours. BNP: BNP (last 3 results) Recent Labs    10/10/23 0201 12/22/23 0012  BNP 4,431.1* >4,500.0*    ProBNP (last 3 results) No results for input(s): "PROBNP" in the last 8760 hours.  CBG: Recent Labs  Lab 12/22/23 0414 12/22/23 0417 12/22/23 0552 12/22/23 0724  GLUCAP 30* 32* 126* 111*       Signed:  Deforest Fast MD.  [facility] 12/24/2023, 10:14 AM

## 2023-12-24 NOTE — Progress Notes (Signed)
 Mobility Specialist Progress Note:    12/24/23 1041  Mobility  Activity Ambulated independently in hallway;Ambulated independently in room  Level of Assistance Independent  Assistive Device None  Distance Ambulated (ft) 260 ft  Activity Response Tolerated well  Mobility Referral Yes  Mobility visit 1 Mobility  Mobility Specialist Start Time (ACUTE ONLY) 1030  Mobility Specialist Stop Time (ACUTE ONLY) 1039  Mobility Specialist Time Calculation (min) (ACUTE ONLY) 9 min   Pt received in bed, agreeable to mobility session. Ambulated in room and hallway independently. Tolerated well, asx throughout, VSS. Returned pt to room, left with all needs met.    Adelyn Roscher Mobility Specialist Please contact via Special educational needs teacher or  Rehab office at [phone]

## 2023-12-25 ENCOUNTER — Telehealth: Payer: Self-pay | Admitting: Nurse Practitioner

## 2023-12-29 DIAGNOSIS — N2581 Secondary hyperparathyroidism of renal origin: Secondary | ICD-10-CM | POA: Diagnosis not present

## 2023-12-29 DIAGNOSIS — R197 Diarrhea, unspecified: Secondary | ICD-10-CM | POA: Diagnosis not present

## 2023-12-29 DIAGNOSIS — D631 Anemia in chronic kidney disease: Secondary | ICD-10-CM | POA: Diagnosis not present

## 2023-12-29 DIAGNOSIS — D509 Iron deficiency anemia, unspecified: Secondary | ICD-10-CM | POA: Diagnosis not present

## 2023-12-29 DIAGNOSIS — N186 End stage renal disease: Secondary | ICD-10-CM | POA: Diagnosis not present

## 2023-12-29 DIAGNOSIS — Z992 Dependence on renal dialysis: Secondary | ICD-10-CM | POA: Diagnosis not present

## 2023-12-29 DIAGNOSIS — I120 Hypertensive chronic kidney disease with stage 5 chronic kidney disease or end stage renal disease: Secondary | ICD-10-CM | POA: Diagnosis not present

## 2023-12-29 DIAGNOSIS — D689 Coagulation defect, unspecified: Secondary | ICD-10-CM | POA: Diagnosis not present

## 2023-12-31 DIAGNOSIS — D631 Anemia in chronic kidney disease: Secondary | ICD-10-CM | POA: Diagnosis not present

## 2023-12-31 DIAGNOSIS — N186 End stage renal disease: Secondary | ICD-10-CM | POA: Diagnosis not present

## 2023-12-31 DIAGNOSIS — Z992 Dependence on renal dialysis: Secondary | ICD-10-CM | POA: Diagnosis not present

## 2023-12-31 DIAGNOSIS — D689 Coagulation defect, unspecified: Secondary | ICD-10-CM | POA: Diagnosis not present

## 2023-12-31 DIAGNOSIS — D509 Iron deficiency anemia, unspecified: Secondary | ICD-10-CM | POA: Diagnosis not present

## 2023-12-31 DIAGNOSIS — N2581 Secondary hyperparathyroidism of renal origin: Secondary | ICD-10-CM | POA: Diagnosis not present

## 2024-01-03 DIAGNOSIS — Z992 Dependence on renal dialysis: Secondary | ICD-10-CM | POA: Diagnosis not present

## 2024-01-03 DIAGNOSIS — N2581 Secondary hyperparathyroidism of renal origin: Secondary | ICD-10-CM | POA: Diagnosis not present

## 2024-01-03 DIAGNOSIS — D509 Iron deficiency anemia, unspecified: Secondary | ICD-10-CM | POA: Diagnosis not present

## 2024-01-03 DIAGNOSIS — D631 Anemia in chronic kidney disease: Secondary | ICD-10-CM | POA: Diagnosis not present

## 2024-01-03 DIAGNOSIS — D689 Coagulation defect, unspecified: Secondary | ICD-10-CM | POA: Diagnosis not present

## 2024-01-03 DIAGNOSIS — N186 End stage renal disease: Secondary | ICD-10-CM | POA: Diagnosis not present

## 2024-01-05 DIAGNOSIS — Z992 Dependence on renal dialysis: Secondary | ICD-10-CM | POA: Diagnosis not present

## 2024-01-05 DIAGNOSIS — N2581 Secondary hyperparathyroidism of renal origin: Secondary | ICD-10-CM | POA: Diagnosis not present

## 2024-01-05 DIAGNOSIS — D631 Anemia in chronic kidney disease: Secondary | ICD-10-CM | POA: Diagnosis not present

## 2024-01-05 DIAGNOSIS — D509 Iron deficiency anemia, unspecified: Secondary | ICD-10-CM | POA: Diagnosis not present

## 2024-01-05 DIAGNOSIS — N186 End stage renal disease: Secondary | ICD-10-CM | POA: Diagnosis not present

## 2024-01-05 DIAGNOSIS — D689 Coagulation defect, unspecified: Secondary | ICD-10-CM | POA: Diagnosis not present

## 2024-01-07 DIAGNOSIS — Z992 Dependence on renal dialysis: Secondary | ICD-10-CM | POA: Diagnosis not present

## 2024-01-07 DIAGNOSIS — D631 Anemia in chronic kidney disease: Secondary | ICD-10-CM | POA: Diagnosis not present

## 2024-01-07 DIAGNOSIS — N186 End stage renal disease: Secondary | ICD-10-CM | POA: Diagnosis not present

## 2024-01-07 DIAGNOSIS — N2581 Secondary hyperparathyroidism of renal origin: Secondary | ICD-10-CM | POA: Diagnosis not present

## 2024-01-07 DIAGNOSIS — D689 Coagulation defect, unspecified: Secondary | ICD-10-CM | POA: Diagnosis not present

## 2024-01-07 DIAGNOSIS — D509 Iron deficiency anemia, unspecified: Secondary | ICD-10-CM | POA: Diagnosis not present

## 2024-01-11 ENCOUNTER — Emergency Department (HOSPITAL_COMMUNITY)

## 2024-01-11 ENCOUNTER — Encounter (HOSPITAL_COMMUNITY): Payer: Self-pay | Admitting: Hospitalist

## 2024-01-11 ENCOUNTER — Observation Stay (HOSPITAL_COMMUNITY)

## 2024-01-11 ENCOUNTER — Inpatient Hospital Stay (HOSPITAL_COMMUNITY)
Admission: EM | Admit: 2024-01-11 | Discharge: 2024-01-14 | DRG: 640 | Disposition: A | Discharge status: Home / Self Care | Attending: Student | Admitting: Student

## 2024-01-11 ENCOUNTER — Other Ambulatory Visit: Payer: Self-pay

## 2024-01-11 DIAGNOSIS — Z8249 Family history of ischemic heart disease and other diseases of the circulatory system: Secondary | ICD-10-CM | POA: Diagnosis not present

## 2024-01-11 DIAGNOSIS — Z91041 Radiographic dye allergy status: Secondary | ICD-10-CM | POA: Diagnosis not present

## 2024-01-11 DIAGNOSIS — L732 Hidradenitis suppurativa: Secondary | ICD-10-CM | POA: Diagnosis present

## 2024-01-11 DIAGNOSIS — I509 Heart failure, unspecified: Secondary | ICD-10-CM | POA: Diagnosis not present

## 2024-01-11 DIAGNOSIS — R140 Abdominal distension (gaseous): Secondary | ICD-10-CM | POA: Diagnosis not present

## 2024-01-11 DIAGNOSIS — Z833 Family history of diabetes mellitus: Secondary | ICD-10-CM

## 2024-01-11 DIAGNOSIS — R0602 Shortness of breath: Secondary | ICD-10-CM | POA: Diagnosis not present

## 2024-01-11 DIAGNOSIS — Z91158 Patient's noncompliance with renal dialysis for other reason: Secondary | ICD-10-CM

## 2024-01-11 DIAGNOSIS — Z992 Dependence on renal dialysis: Secondary | ICD-10-CM

## 2024-01-11 DIAGNOSIS — Z885 Allergy status to narcotic agent status: Secondary | ICD-10-CM

## 2024-01-11 DIAGNOSIS — L0231 Cutaneous abscess of buttock: Secondary | ICD-10-CM

## 2024-01-11 DIAGNOSIS — F17210 Nicotine dependence, cigarettes, uncomplicated: Secondary | ICD-10-CM | POA: Diagnosis present

## 2024-01-11 DIAGNOSIS — I5043 Acute on chronic combined systolic (congestive) and diastolic (congestive) heart failure: Secondary | ICD-10-CM | POA: Diagnosis not present

## 2024-01-11 DIAGNOSIS — I132 Hypertensive heart and chronic kidney disease with heart failure and with stage 5 chronic kidney disease, or end stage renal disease: Secondary | ICD-10-CM | POA: Diagnosis present

## 2024-01-11 DIAGNOSIS — K5900 Constipation, unspecified: Secondary | ICD-10-CM | POA: Diagnosis not present

## 2024-01-11 DIAGNOSIS — D631 Anemia in chronic kidney disease: Secondary | ICD-10-CM | POA: Diagnosis not present

## 2024-01-11 DIAGNOSIS — K219 Gastro-esophageal reflux disease without esophagitis: Secondary | ICD-10-CM | POA: Diagnosis present

## 2024-01-11 DIAGNOSIS — Z1152 Encounter for screening for COVID-19: Secondary | ICD-10-CM

## 2024-01-11 DIAGNOSIS — R0682 Tachypnea, not elsewhere classified: Secondary | ICD-10-CM | POA: Diagnosis present

## 2024-01-11 DIAGNOSIS — E875 Hyperkalemia: Secondary | ICD-10-CM | POA: Diagnosis not present

## 2024-01-11 DIAGNOSIS — N186 End stage renal disease: Secondary | ICD-10-CM | POA: Diagnosis present

## 2024-01-11 DIAGNOSIS — R0689 Other abnormalities of breathing: Secondary | ICD-10-CM | POA: Diagnosis not present

## 2024-01-11 DIAGNOSIS — M109 Gout, unspecified: Secondary | ICD-10-CM | POA: Diagnosis present

## 2024-01-11 DIAGNOSIS — E8779 Other fluid overload: Secondary | ICD-10-CM | POA: Diagnosis not present

## 2024-01-11 DIAGNOSIS — R6889 Other general symptoms and signs: Secondary | ICD-10-CM | POA: Diagnosis not present

## 2024-01-11 DIAGNOSIS — Z79899 Other long term (current) drug therapy: Secondary | ICD-10-CM

## 2024-01-11 DIAGNOSIS — I2489 Other forms of acute ischemic heart disease: Secondary | ICD-10-CM | POA: Diagnosis present

## 2024-01-11 DIAGNOSIS — N179 Acute kidney failure, unspecified: Secondary | ICD-10-CM | POA: Diagnosis present

## 2024-01-11 DIAGNOSIS — M329 Systemic lupus erythematosus, unspecified: Secondary | ICD-10-CM | POA: Diagnosis present

## 2024-01-11 DIAGNOSIS — Z743 Need for continuous supervision: Secondary | ICD-10-CM | POA: Diagnosis not present

## 2024-01-11 DIAGNOSIS — R0989 Other specified symptoms and signs involving the circulatory and respiratory systems: Secondary | ICD-10-CM | POA: Diagnosis not present

## 2024-01-11 DIAGNOSIS — Z888 Allergy status to other drugs, medicaments and biological substances status: Secondary | ICD-10-CM

## 2024-01-11 DIAGNOSIS — E8770 Fluid overload, unspecified: Secondary | ICD-10-CM | POA: Diagnosis present

## 2024-01-11 DIAGNOSIS — N2581 Secondary hyperparathyroidism of renal origin: Secondary | ICD-10-CM | POA: Diagnosis present

## 2024-01-11 DIAGNOSIS — L988 Other specified disorders of the skin and subcutaneous tissue: Secondary | ICD-10-CM | POA: Diagnosis present

## 2024-01-11 DIAGNOSIS — F17290 Nicotine dependence, other tobacco product, uncomplicated: Secondary | ICD-10-CM | POA: Diagnosis present

## 2024-01-11 DIAGNOSIS — Z886 Allergy status to analgesic agent status: Secondary | ICD-10-CM

## 2024-01-11 DIAGNOSIS — Z8269 Family history of other diseases of the musculoskeletal system and connective tissue: Secondary | ICD-10-CM | POA: Diagnosis not present

## 2024-01-11 DIAGNOSIS — I110 Hypertensive heart disease with heart failure: Secondary | ICD-10-CM | POA: Diagnosis not present

## 2024-01-11 DIAGNOSIS — Z841 Family history of disorders of kidney and ureter: Secondary | ICD-10-CM

## 2024-01-11 DIAGNOSIS — G43909 Migraine, unspecified, not intractable, without status migrainosus: Secondary | ICD-10-CM | POA: Diagnosis present

## 2024-01-11 DIAGNOSIS — I5032 Chronic diastolic (congestive) heart failure: Secondary | ICD-10-CM | POA: Diagnosis not present

## 2024-01-11 HISTORY — DX: Cutaneous abscess of buttock: L02.31

## 2024-01-11 LAB — CBC WITH DIFFERENTIAL/PLATELET
Abs Immature Granulocytes: 0.07 10*3/uL (ref 0.00–0.07)
Basophils Absolute: 0 10*3/uL (ref 0.0–0.1)
Basophils Relative: 0 %
Eosinophils Absolute: 0.4 10*3/uL (ref 0.0–0.5)
Eosinophils Relative: 4 %
HCT: 27 % — ABNORMAL LOW (ref 36.0–46.0)
Hemoglobin: 8.3 g/dL — ABNORMAL LOW (ref 12.0–15.0)
Immature Granulocytes: 1 %
Lymphocytes Relative: 31 %
Lymphs Abs: 2.9 10*3/uL (ref 0.7–4.0)
MCH: 30.1 pg (ref 26.0–34.0)
MCHC: 30.7 g/dL (ref 30.0–36.0)
MCV: 97.8 fL (ref 80.0–100.0)
Monocytes Absolute: 0.8 10*3/uL (ref 0.1–1.0)
Monocytes Relative: 9 %
Neutro Abs: 5.2 10*3/uL (ref 1.7–7.7)
Neutrophils Relative %: 55 %
Platelets: 217 10*3/uL (ref 150–400)
RBC: 2.76 MIL/uL — ABNORMAL LOW (ref 3.87–5.11)
RDW: 20.3 % — ABNORMAL HIGH (ref 11.5–15.5)
WBC: 9.5 10*3/uL (ref 4.0–10.5)
nRBC: 0.2 % (ref 0.0–0.2)

## 2024-01-11 LAB — COMPREHENSIVE METABOLIC PANEL WITH GFR
ALT: 9 U/L (ref 0–44)
AST: 23 U/L (ref 15–41)
Albumin: 3.2 g/dL — ABNORMAL LOW (ref 3.5–5.0)
Alkaline Phosphatase: 67 U/L (ref 38–126)
Anion gap: 20 — ABNORMAL HIGH (ref 5–15)
BUN: 59 mg/dL — ABNORMAL HIGH (ref 6–20)
CO2: 20 mmol/L — ABNORMAL LOW (ref 22–32)
Calcium: 9.7 mg/dL (ref 8.9–10.3)
Chloride: 98 mmol/L (ref 98–111)
Creatinine, Ser: 11.1 mg/dL — ABNORMAL HIGH (ref 0.44–1.00)
GFR, Estimated: 4 mL/min — ABNORMAL LOW (ref >60)
Glucose, Bld: 77 mg/dL (ref 70–99)
Potassium: 5.9 mmol/L — ABNORMAL HIGH (ref 3.5–5.1)
Sodium: 138 mmol/L (ref 135–145)
Total Bilirubin: 0.9 mg/dL (ref 0.0–1.2)
Total Protein: 7.8 g/dL (ref 6.5–8.1)

## 2024-01-11 LAB — RESP PANEL BY RT-PCR (RSV, FLU A&B, COVID)  RVPGX2
Influenza A by PCR: NEGATIVE
Influenza B by PCR: NEGATIVE
Resp Syncytial Virus by PCR: NEGATIVE
SARS Coronavirus 2 by RT PCR: NEGATIVE

## 2024-01-11 LAB — TROPONIN I (HIGH SENSITIVITY)
Troponin I (High Sensitivity): 33 ng/L — ABNORMAL HIGH (ref <18)
Troponin I (High Sensitivity): 36 ng/L — ABNORMAL HIGH (ref <18)

## 2024-01-11 LAB — BRAIN NATRIURETIC PEPTIDE: B Natriuretic Peptide: >4500 pg/mL — ABNORMAL HIGH (ref 0.0–100.0)

## 2024-01-11 LAB — HCG, QUANTITATIVE, PREGNANCY: hCG, Beta Chain, Quant, S: 2 m[IU]/mL (ref <5)

## 2024-01-11 LAB — D-DIMER, QUANTITATIVE: D-Dimer, Quant: 2.75 ug{FEU}/mL — ABNORMAL HIGH (ref 0.00–0.50)

## 2024-01-11 MED ORDER — CHLORHEXIDINE GLUCONATE CLOTH 2 % EX PADS
6.0000 | MEDICATED_PAD | Freq: Every day | CUTANEOUS | Status: DC
  Administered 2024-01-12: 6 via TOPICAL

## 2024-01-11 MED ORDER — CARVEDILOL 25 MG PO TABS
25.0000 mg | ORAL_TABLET | Freq: Two times a day (BID) | ORAL | Status: DC
  Administered 2024-01-11 – 2024-01-13 (×5): 25 mg via ORAL
  Filled 2024-01-11 (×6): qty 1

## 2024-01-11 MED ORDER — SODIUM ZIRCONIUM CYCLOSILICATE 10 G PO PACK
10.0000 g | PACK | Freq: Once | ORAL | Status: AC
  Administered 2024-01-11: 10 g via ORAL
  Filled 2024-01-11: qty 1

## 2024-01-11 MED ORDER — FUROSEMIDE 10 MG/ML IJ SOLN
60.0000 mg | Freq: Once | INTRAMUSCULAR | Status: AC
  Administered 2024-01-11: 60 mg via INTRAVENOUS
  Filled 2024-01-11: qty 6

## 2024-01-11 MED ORDER — AMLODIPINE BESYLATE 10 MG PO TABS
10.0000 mg | ORAL_TABLET | Freq: Every day | ORAL | Status: DC
  Administered 2024-01-11 – 2024-01-14 (×4): 10 mg via ORAL
  Filled 2024-01-11 (×4): qty 1

## 2024-01-11 MED ORDER — IRBESARTAN 300 MG PO TABS
300.0000 mg | ORAL_TABLET | Freq: Every day | ORAL | Status: DC
  Administered 2024-01-11 – 2024-01-14 (×4): 300 mg via ORAL
  Filled 2024-01-11 (×4): qty 1

## 2024-01-11 MED ORDER — HEPARIN SODIUM (PORCINE) 5000 UNIT/ML IJ SOLN
5000.0000 [IU] | Freq: Three times a day (TID) | INTRAMUSCULAR | Status: DC
  Administered 2024-01-11 – 2024-01-14 (×7): 5000 [IU] via SUBCUTANEOUS
  Filled 2024-01-11 (×9): qty 1

## 2024-01-11 MED ORDER — CALCITRIOL 0.25 MCG PO CAPS
0.2500 ug | ORAL_CAPSULE | ORAL | Status: DC
  Administered 2024-01-12 – 2024-01-14 (×2): 0.25 ug via ORAL
  Filled 2024-01-11 (×2): qty 1

## 2024-01-11 MED ORDER — HYDROMORPHONE HCL 1 MG/ML IJ SOLN
0.5000 mg | INTRAMUSCULAR | Status: DC | PRN
  Administered 2024-01-11 – 2024-01-12 (×7): 0.5 mg via INTRAVENOUS
  Filled 2024-01-11 (×6): qty 0.5

## 2024-01-11 MED ORDER — METOCLOPRAMIDE HCL 5 MG/ML IJ SOLN
10.0000 mg | Freq: Once | INTRAMUSCULAR | Status: AC
  Administered 2024-01-11: 10 mg via INTRAVENOUS
  Filled 2024-01-11: qty 2

## 2024-01-11 MED ORDER — FUROSEMIDE 10 MG/ML IJ SOLN
80.0000 mg | Freq: Every day | INTRAMUSCULAR | Status: DC
  Administered 2024-01-11 – 2024-01-14 (×4): 80 mg via INTRAVENOUS
  Filled 2024-01-11 (×4): qty 8

## 2024-01-11 MED ORDER — OXYCODONE HCL 5 MG PO TABS
5.0000 mg | ORAL_TABLET | Freq: Once | ORAL | Status: AC
  Administered 2024-01-11: 5 mg via ORAL
  Filled 2024-01-11: qty 1

## 2024-01-11 NOTE — ED Provider Notes (Cosign Needed Addendum)
 [place] EMERGENCY DEPARTMENT AT [facility] Provider Note   CSN: [id number] Arrival date & time: 01/11/24  0865     History Chief Complaint  Patient presents with   Shortness of Breath    Linda [name] is a 28 y.o. female.  Patient with past history significant for ESRD on hemodialysis, episodes of pulmonary edema, chronic heart failure, lupus here with concerns of shortness of breath today.  She states that she has been feeling increasingly short of breath in the last week.  States that shortness of breath typically worsens with ambulation.  Has been receiving dialysis as indicated with no missed sessions recently.  She has a Monday Wednesday Friday dialysis patient.  She states she takes all her medications.  Endorses feeling somewhat bloated as well as some chills but denies any fevers.  Has had some coughing that she reports as being somewhat productive with a slight green tinge.   Shortness of Breath      Home Medications Prior to Admission medications   Medication Sig Start Date End Date Taking? Authorizing Provider  amLODipine  (NORVASC ) 10 MG tablet Take 10 mg by mouth daily. 08/05/21   [provider]  calcitRIOL  (ROCALTROL ) 0.25 MCG capsule Take 1 tablet by mouth every Monday, Wednesday, and Friday with hemodialysis.    [provider]  carvedilol  (COREG ) 25 MG tablet Take 1 tablet (25 mg total) by mouth 2 (two) times daily with a meal. 09/03/23   Samtani, Jai-Gurmukh, MD  furosemide  (LASIX ) 80 MG tablet Take 1 tablet (80 mg total) by mouth daily. 12/23/23   Deforest Fast, MD  medroxyPROGESTERone  (DEPO-PROVERA ) 150 MG/ML injection Inject 1 mL (150 mg total) into the muscle every 3 (three) months. 10/29/22   Gabrielle Joiner, MD  naloxone [facility]) nasal spray 4 mg/0.1 mL Place 1 spray into the nose once. If needed for emergency overdose.    [provider]  nitroGLYCERIN  (NITROSTAT ) 0.4 MG SL tablet Place 1 tablet (0.4 mg total) under  the tongue every 5 (five) minutes as needed for chest pain. 10/28/23   Rai, Hurman Maiden, MD  ondansetron  (ZOFRAN -ODT) 4 MG disintegrating tablet Take 1 tablet (4 mg total) by mouth every 8 (eight) hours as needed for nausea or vomiting. 10/28/23   Rai, Ripudeep K, MD  oxyCODONE  10 MG TABS Take 1 tablet (10 mg total) by mouth every 6 (six) hours as needed for pain. 12/24/23   Deforest Fast, MD  sodium bicarbonate  650 MG tablet Take 1 tablet (650 mg total) by mouth 2 (two) times daily. 09/05/21   Singh, Prashant K, MD  tiZANidine  (ZANAFLEX ) 4 MG tablet Take 4 mg by mouth every 8 (eight) hours as needed for muscle spasms. 12/03/23   [provider]      Allergies    Iodine , Lisinopril , Gabapentin, Hydroxychloroquine , and Vicodin [hydrocodone -acetaminophen ]    Review of Systems   Review of Systems  Respiratory:  Positive for shortness of breath.   All other systems reviewed and are negative.   Physical Exam Updated Vital Signs BP (!) 162/121   Pulse 99   Temp 98 F (36.7 C) (Oral)   Resp 15   Ht 5\' 6"  (1.676 m)   Wt 72 kg   SpO2 100%   BMI 25.62 kg/m  Physical Exam Vitals and nursing note reviewed.  Constitutional:      General: She is not in acute distress.    Appearance: She is well-developed.  HENT:     Head: Normocephalic  and atraumatic.  Eyes:     Conjunctiva/sclera: Conjunctivae normal.  Cardiovascular:     Rate and Rhythm: Normal rate and regular rhythm.     Heart sounds: No murmur heard. Pulmonary:     Effort: Pulmonary effort is normal. No respiratory distress.     Breath sounds: Examination of the right-upper field reveals rales. Examination of the right-middle field reveals rales. Rales present. No decreased breath sounds, wheezing or rhonchi.  Abdominal:     Palpations: Abdomen is soft.     Tenderness: There is no abdominal tenderness.  Musculoskeletal:        General: No swelling.     Cervical back: Neck supple.  Skin:    General: Skin is warm and dry.      Capillary Refill: Capillary refill takes less than 2 seconds.  Neurological:     Mental Status: She is alert.  Psychiatric:        Mood and Affect: Mood normal.     ED Results / Procedures / Treatments   Labs (all labs ordered are listed, but only abnormal results are displayed) Labs Reviewed  CBC WITH DIFFERENTIAL/PLATELET - Abnormal; Notable for the following components:      Result Value   RBC 2.76 (*)    Hemoglobin 8.3 (*)    HCT 27.0 (*)    RDW 20.3 (*)    All other components within normal limits  COMPREHENSIVE METABOLIC PANEL WITH GFR - Abnormal; Notable for the following components:   Potassium 5.9 (*)    CO2 20 (*)    BUN 59 (*)    Creatinine, Ser 11.10 (*)    Albumin  3.2 (*)    GFR, Estimated 4 (*)    Anion gap 20 (*)    All other components within normal limits  BRAIN NATRIURETIC PEPTIDE - Abnormal; Notable for the following components:   B Natriuretic Peptide >4,500.0 (*)    All other components within normal limits  TROPONIN I (HIGH SENSITIVITY) - Abnormal; Notable for the following components:   Troponin I (High Sensitivity) 33 (*)    All other components within normal limits  RESP PANEL BY RT-PCR (RSV, FLU A&B, COVID)  [id number]  HCG, QUANTITATIVE, PREGNANCY  TROPONIN I (HIGH SENSITIVITY)    EKG None  Radiology DG Chest Portable 1 View Result Date: 01/11/2024 CLINICAL DATA:  Shortness of breath. EXAM: PORTABLE CHEST 1 VIEW COMPARISON:  12/21/2023 FINDINGS: The cardio pericardial silhouette is enlarged. Vascular congestion with basilar predominant interstitial opacity, suspicious for edema. No dense focal airspace consolidation or pleural effusion. No acute bony abnormality. Telemetry leads overlie the chest. IMPRESSION: Enlargement of the cardiopericardial silhouette with vascular congestion and basilar predominant interstitial opacity, suspicious for edema. Electronically Signed   By: Donnal Fusi M.D.   On: 01/11/2024 05:00    Procedures .Critical  Care  Performed by: Daryan Cagley A, PA-C Authorized by: Mianna Iezzi A, PA-C   Critical care provider statement:    Critical care time (minutes):  32   Critical care start time:  01/11/2024 6:20 AM   Critical care end time:  01/11/2024 6:52 AM   Critical care time was exclusive of:  Separately billable procedures and treating other patients   Critical care was necessary to treat or prevent imminent or life-threatening deterioration of the following conditions:  Renal failure   Critical care was time spent personally by me on the following activities:  Development of treatment plan with patient or surrogate, discussions with consultants, evaluation of patient's response  to treatment, examination of patient, re-evaluation of patient's condition, ordering and review of laboratory studies and ordering and review of radiographic studies   I assumed direction of critical care for this patient from another provider in my specialty: no     Care discussed with: admitting provider       Medications Ordered in ED Medications  sodium zirconium cyclosilicate  (LOKELMA ) packet 10 g (has no administration in time range)  furosemide  (LASIX ) injection 60 mg (60 mg Intravenous Given 01/11/24 0556)    ED Course/ Medical Decision Making/ A&P Clinical Course as of 01/11/24 0633  Tue Jan 11, 2024  0410 Hemoglobin(!): 8.3 Consistent with baseline hemoglobin between 8-10. [OZ]  0510 DG Chest Portable 1 View Enlargement of the cardiopericardial silhouette with vascular congestion and basilar predominant interstitial opacity, suspicious for edema.   [OZ]  0526 Potassium(!): 5.9 Elevated. No EKG changes. [OZ]  [id number] B Natriuretic Peptide(!): >4,500.0 Will likely require emergent dialysis. [OZ]    Clinical Course User Index [OZ] Concetta Dee, PA-C                                 Medical Decision Making Amount and/or Complexity of Data Reviewed Labs: ordered. Decision-making details documented in ED  Course. Radiology: ordered. Decision-making details documented in ED Course.  Risk Prescription drug management.   This patient presents to the ED for concern of shortness of breath.  Differential diagnosis includes CHF, ACS, PE, pneumonia, bronchitis, viral URI   Lab Tests:  I Ordered, and personally interpreted labs.  The pertinent results include: CBC with hemoglobin close to baseline at 8.3, troponin elevated at 33 but consistent with baseline, BNP elevated at greater than 4500, CMP shows hyperkalemia 5.9, respiratory panel negative   Imaging Studies ordered:  I ordered imaging studies including chest x-ray I independently visualized and interpreted imaging which showed enlargement of the cardiopericardial silhouette with vascular congestion and basilar predominant interstitial opacity, suspicious for edema. I agree with the radiologist interpretation   Medicines ordered and prescription drug management:  I ordered medication including Lasix , Lokelma  for fluid overload, hyperkalemia Reevaluation of the patient after these medicines showed that the patient stayed the same I have reviewed the patients home medicines and have made adjustments as needed   Problem List / ED Course:  Patient past history significant for ESRD on hemodialysis, CHF, lupus here with concerns of shortness of breath.  States this is been ongoing for the last week without specific inciting or alleviating factors.  She states she has been to all of her dialysis sessions and is not missing any sessions.  On arrival, patient is mildly tachycardic with no obvious tachypnea and saturating well at 2 L nasal cannula. Physical exam reveals some abnormal lung sounds primary to the right side.  No wheezing seen.  Minimal leg edema seen. Patient's lab workup is concerning for CHF with BNP elevated at greater than 4500.  Likely secondary to patient's ESRD.  Potassium is also notably elevated at 5.9.  No significant EKG  changes at this time patient denies any feelings of heart palpitations.  Consult placed to nephrology for emergent dialysis.  Patient will require admission. Spoke with Dr. [facility] Lavender, hospitalist, who will be admitting and transfer to the day team. Still waiting to hear from nephrology. Will likely require to sign out this consultation to oncoming day PA Harris.  Social Determinants of Health:  ESRD on HD  Final Clinical  Impression(s) / ED Diagnoses Final diagnoses:  Acute on chronic congestive heart failure, unspecified heart failure type (HCC)  Hyperkalemia  Shortness of breath    Rx / DC Orders ED Discharge Orders     None         Concetta Dee, PA-C 01/11/24 0645    Concetta Dee, PA-C 01/11/24 9147    Rory Collard, MD 01/12/24 [phone]

## 2024-01-11 NOTE — Consult Note (Signed)
 Linda [name] 09-01-1995  [id number].    Requesting MD: Sulema Endo, MD Chief Complaint/Reason for Consult: buttock wounds  HPI:  Linda [name] is a 28 y/o F with PMH ESRD, SLE, HTN, chronic heart failure, and anemia who was admitted to the hospital due to SOB and missing HD. She was admitted for respiratory failure and HD. During her admission buttock wounds were noted and general surgery has been asked to evaluate   Today she tells me she has had bilateral buttock wounds that have been present for one year but are larger, and more tender, now. She denies fever, chills, redness, warmth, or drainage from the area. She says the areas are similar, but larger, than spots that she also has on her arms.   At baseline she reports smoking 2-3 cigarettes daily. She lives at home with her grandmother and says she is independent of ADL's.   ROS: Review of Systems  All other systems reviewed and are negative.   Family History  Problem Relation Age of Onset   Lupus Mother    Hypertension Mother    Kidney disease Mother    Heart Problems Mother    Coronary artery disease Mother 58       stent   Diabetes Maternal Grandmother     Past Medical History:  Diagnosis Date   Abnormal ECG    a. In setting of LVH w/ repolarization abnormalities; b. 03/2019 MV: No ischemia/infarct. EF 55-65%.   Angioedema    ESRD (end stage renal disease) (HCC)    on HD (M,W,F)   GERD (gastroesophageal reflux disease)    Hypertension    Lupus    LVH (left ventricular hypertrophy)    a. 11/2017 Echo: EF 50-55%. triv AI. Mild MR. Mod L and mild R atrial enlargement. Nl RV size/fxn. Small pericardial effusion w/o tampondae; b. 03/2018 Echo: EF 55-60%, restrictive filling pattern. Nl RV size/fxn. Sev LAE. Mild MR, mild to mod TR/PR. Mod PAH. Triv effusion.   Migraines    Mixed connective tissue disease (HCC)    Previous Dialysis patient [facility])    a. Off HD since late 2020.   Septic prepatellar bursitis of right  knee 08/19/2017    Past Surgical History:  Procedure Laterality Date   GRAFT APPLICATION Left 03/30/2019   Procedure: BRACHIAL CEPHALIC GRAFT CREATION;  Surgeon: Celso College, MD;  Location: [facility] ORS;  Service: Vascular;  Laterality: Left;   I & D EXTREMITY Right 08/06/2017   Procedure: IRRIGATION AND DEBRIDEMENT KNEE;  Surgeon: Laneta Pintos, MD;  Location: [facility] OR;  Service: Orthopedics;  Laterality: Right;   IR THROMBECTOMY AV FISTULA W/THROMBOLYSIS/PTA/STENT INC/SHUNT/IMG LT Left 05/18/2022   IR US  GUIDE VASC ACCESS LEFT  05/18/2022   MULTIPLE TOOTH EXTRACTIONS      Social History:  reports that she has been smoking e-cigarettes. She started smoking about 8 years ago. She has a 2.2 pack-year smoking history. She has never used smokeless tobacco. She reports that she does not drink alcohol and does not use drugs.  Allergies:  Allergies  Allergen Reactions   Iodine  Hives and Shortness Of Breath   Lisinopril  Anaphylaxis    Angioedema   Gabapentin Other (See Comments)    Reaction: Tremor (intolerance); tremor   Hydroxychloroquine  Other (See Comments)    Pt reports making her skin peel really bad   Vicodin [Hydrocodone -Acetaminophen ] Itching and Rash    Medications Prior to Admission  Medication Sig Dispense Refill   amLODipine  (NORVASC ) 10  MG tablet Take 10 mg by mouth daily.     calcitRIOL  (ROCALTROL ) 0.25 MCG capsule Take 1 tablet by mouth every Monday, Wednesday, and Friday with hemodialysis.     carvedilol  (COREG ) 25 MG tablet Take 1 tablet (25 mg total) by mouth 2 (two) times daily with a meal. (Patient taking differently: Take 100 mg by mouth daily.)     furosemide  (LASIX ) 80 MG tablet Take 1 tablet (80 mg total) by mouth daily. (Patient taking differently: Take 160 mg by mouth daily.)     irbesartan  (AVAPRO ) 300 MG tablet Take 300 mg by mouth daily.     medroxyPROGESTERone  (DEPO-PROVERA ) 150 MG/ML injection Inject 1 mL (150 mg total) into the muscle every 3 (three) months. 1 mL  0   naloxone (NARCAN) nasal spray 4 mg/0.1 mL Place 1 spray into the nose once. If needed for emergency overdose.     nitroGLYCERIN  (NITROSTAT ) 0.4 MG SL tablet Place 1 tablet (0.4 mg total) under the tongue every 5 (five) minutes as needed for chest pain. 30 tablet 12   ondansetron  (ZOFRAN -ODT) 4 MG disintegrating tablet Take 1 tablet (4 mg total) by mouth every 8 (eight) hours as needed for nausea or vomiting. 20 tablet 0   oxyCODONE  10 MG TABS Take 1 tablet (10 mg total) by mouth every 6 (six) hours as needed for pain. 20 tablet 0   tiZANidine  (ZANAFLEX ) 4 MG tablet Take 4 mg by mouth every 8 (eight) hours as needed for muscle spasms.       Physical Exam: Blood pressure (!) 160/118, pulse 94, temperature 98 F (36.7 C), resp. rate (!) 32, height 5\' 6"  (1.676 m), weight 72 kg, SpO2 97%. General: Cooperative chronically ill appearing black female, laying on hospital bed, appears older than stated age HEENT: head -normocephalic, atraumatic; Eyes: PERRLA, no conjunctival injection; Neck- Trachea is midline CV- RRR, normal S1/S2, no M/R/G, no lower extremity edema  Pulm- breathing is slightly labored on nasal cannula  Abd- soft, NT/ND GU- R>L buttock lesions that are firm and tender. There is no cellulitis or warmth. No fluctuance. See image under media MSK- UE/LE symmetrical, no cyanosis, clubbing, or edema. Neuro- CN II-XII grossly in tact, no paresthesias. Psych- Alert and Oriented x3 with appropriate affect Skin: multiple firm nodular lesions of the forearms bilaterally without cellulitis    Results for orders placed or performed during the hospital encounter of 01/11/24 (from the past 48 hours)  CBC with Differential     Status: Abnormal   Collection Time: 01/11/24  3:47 AM  Result Value Ref Range   WBC 9.5 4.0 - 10.5 K/uL   RBC 2.76 (L) 3.87 - 5.11 MIL/uL   Hemoglobin 8.3 (L) 12.0 - 15.0 g/dL   HCT 09.8 (L) 11.9 - 14.7 %   MCV 97.8 80.0 - 100.0 fL   MCH 30.1 26.0 - 34.0 pg    MCHC 30.7 30.0 - 36.0 g/dL   RDW 82.9 (H) 56.2 - 13.0 %   Platelets 217 150 - 400 K/uL   nRBC 0.2 0.0 - 0.2 %   Neutrophils Relative % 55 %   Neutro Abs 5.2 1.7 - 7.7 K/uL   Lymphocytes Relative 31 %   Lymphs Abs 2.9 0.7 - 4.0 K/uL   Monocytes Relative 9 %   Monocytes Absolute 0.8 0.1 - 1.0 K/uL   Eosinophils Relative 4 %   Eosinophils Absolute 0.4 0.0 - 0.5 K/uL   Basophils Relative 0 %   Basophils Absolute 0.0 0.0 -  0.1 K/uL   Immature Granulocytes 1 %   Abs Immature Granulocytes 0.07 0.00 - 0.07 K/uL    Comment: Performed at [facility], [address] [place], [place] [address]  Comprehensive metabolic panel     Status: Abnormal   Collection Time: 01/11/24  3:47 AM  Result Value Ref Range   Sodium 138 135 - 145 mmol/L   Potassium 5.9 (H) 3.5 - 5.1 mmol/L   Chloride 98 98 - 111 mmol/L   CO2 20 (L) 22 - 32 mmol/L   Glucose, Bld 77 70 - 99 mg/dL    Comment: Glucose reference range applies only to samples taken after fasting for at least 8 hours.   BUN 59 (H) 6 - 20 mg/dL   Creatinine, Ser 21.30 (H) 0.44 - 1.00 mg/dL   Calcium  9.7 8.9 - 10.3 mg/dL   Total Protein 7.8 6.5 - 8.1 g/dL   Albumin  3.2 (L) 3.5 - 5.0 g/dL   AST 23 15 - 41 U/L   ALT 9 0 - 44 U/L   Alkaline Phosphatase 67 38 - 126 U/L   Total Bilirubin 0.9 0.0 - 1.2 mg/dL   GFR, Estimated 4 (L) >60 mL/min    Comment: (NOTE) Calculated using the CKD-EPI Creatinine Equation (2021)    Anion gap 20 (H) 5 - 15    Comment: Performed at [facility], [address] [place], [place] [address]  Troponin I (High Sensitivity)     Status: Abnormal   Collection Time: 01/11/24  3:47 AM  Result Value Ref Range   Troponin I (High Sensitivity) 33 (H) <18 ng/L    Comment: (NOTE) Elevated high sensitivity troponin I (hsTnI) values and significant  changes across serial measurements may suggest ACS but many other  chronic and acute conditions are known to elevate hsTnI results.  Refer to the "Links" section for  chest pain algorithms and additional  guidance. Performed at [facility], [address], [place], [place] [address]   Brain natriuretic peptide     Status: Abnormal   Collection Time: 01/11/24  3:48 AM  Result Value Ref Range   B Natriuretic Peptide >4,500.0 (H) 0.0 - 100.0 pg/mL    Comment: Performed at [facility], [address], [place], [place] [address]  Resp panel by RT-PCR (RSV, Flu A&B, Covid) Anterior Nasal Swab     Status: None   Collection Time: 01/11/24  3:55 AM   Specimen: Anterior Nasal Swab  Result Value Ref Range   SARS Coronavirus 2 by RT PCR NEGATIVE NEGATIVE   Influenza A by PCR NEGATIVE NEGATIVE   Influenza B by PCR NEGATIVE NEGATIVE    Comment: (NOTE) The Xpert Xpress SARS-CoV-2/FLU/RSV plus assay is intended as an aid in the diagnosis of influenza from Nasopharyngeal swab specimens and should not be used as a sole basis for treatment. Nasal washings and aspirates are unacceptable for Xpert Xpress SARS-CoV-2/FLU/RSV testing.  Fact Sheet for Patients: BloggerCourse.com  Fact Sheet for Healthcare Providers: SeriousBroker.it  This test is not yet approved or cleared by the [place]  FDA and has been authorized for detection and/or diagnosis of SARS-CoV-2 by FDA under an Emergency Use Authorization (EUA). This EUA will remain in effect (meaning this test can be used) for the duration of the COVID-19 declaration under Section 564(b)(1) of the Act, 21 U.S.C. section 360bbb-3(b)(1), unless the authorization is terminated or revoked.     Resp Syncytial Virus by PCR NEGATIVE NEGATIVE  Comment: (NOTE) Fact Sheet for Patients: BloggerCourse.com  Fact Sheet for Healthcare Providers: SeriousBroker.it  This test is not yet approved or cleared by the [place]  FDA and has been authorized for detection and/or diagnosis of SARS-CoV-2 by FDA  under an Emergency Use Authorization (EUA). This EUA will remain in effect (meaning this test can be used) for the duration of the COVID-19 declaration under Section 564(b)(1) of the Act, 21 U.S.C. section 360bbb-3(b)(1), unless the authorization is terminated or revoked.  Performed at [facility], [address], [place], [place] [address]   Troponin I (High Sensitivity)     Status: Abnormal   Collection Time: 01/11/24  5:49 AM  Result Value Ref Range   Troponin I (High Sensitivity) 36 (H) <18 ng/L    Comment: (NOTE) Elevated high sensitivity troponin I (hsTnI) values and significant  changes across serial measurements may suggest ACS but many other  chronic and acute conditions are known to elevate hsTnI results.  Refer to the "Links" section for chest pain algorithms and additional  guidance. Performed at [facility], [address], [place], [place] [address]   hCG, quantitative, pregnancy     Status: None   Collection Time: 01/11/24  6:49 AM  Result Value Ref Range   hCG, Beta Chain, Quant, S 2 <5 mIU/mL    Comment:          GEST. AGE      CONC.  (mIU/mL)   <=1 WEEK        5 - 50     2 WEEKS       50 - 500     3 WEEKS       100 - 10,000     4 WEEKS     1,000 - 30,000     5 WEEKS     3,500 - 115,000   6-8 WEEKS     12,000 - 270,000    12 WEEKS     15,000 - 220,000        FEMALE AND NON-PREGNANT FEMALE:     LESS THAN 5 mIU/mL Performed at [facility], [address] [place], [place] [address]    DG Chest Portable 1 View Result Date: 01/11/2024 CLINICAL DATA:  Shortness of breath. EXAM: PORTABLE CHEST 1 VIEW COMPARISON:  12/21/2023 FINDINGS: The cardio pericardial silhouette is enlarged. Vascular congestion with basilar predominant interstitial opacity, suspicious for edema. No dense focal airspace consolidation or pleural effusion. No acute bony abnormality. Telemetry leads overlie the chest. IMPRESSION: Enlargement of the cardiopericardial silhouette  with vascular congestion and basilar predominant interstitial opacity, suspicious for edema. Electronically Signed   By: Donnal Fusi M.D.   On: 01/11/2024 05:00      Assessment/Plan 28 y/o F who presents with enlarging, tender buttock lesions that have been present for one year. She has similar, smaller lesions on her arms. She is afebrile, WBC 9, no cellulitis, fluctuance, or other evidence of infection. I suspect these lesions are chronic, possibly calciphylaxis. I do not see evidence of acute infection. No role for surgery at this time. Biopsies of this area would be low yield and would likely not heal if they are a result of calciphylaxis. Noted that primary team ordered a CT of the pelvis, I will follow up on the results. At this time no role for surgery, we will sign off. Call as needed.   ESRD Heart failure Respiratory failure HTN Lupus    I reviewed  nursing notes, hospitalist notes, last 24 h vitals and pain scores, last 48 h intake and output, last 24 h labs and trends, and last 24 h imaging results.  Charlott Converse, [facility] 01/11/2024, 12:36 PM Please see Amion for pager number during day hours 7:00am-4:30pm or 7:00am -11:30am on weekends

## 2024-01-11 NOTE — H&P (Addendum)
 History and Physical    Patient: Linda [name] MWU:[id number] DOB: [birth year] DOA: 01/11/2024 DOS: the patient was seen and examined on 01/11/2024 PCP: Kline, Chianne, PA-C  Patient coming from: Home  Chief Complaint:  Chief Complaint  Patient presents with   Shortness of Breath   HPI: Linda [name] is a 28 y.o. female with medical history significant of SLE, ESRD on HD MWF, HTN, anemia, and chronic HFmrEF p/w AHRF iso volume overload c/b b/l buttock wounds.  Pt states that she was in her USOH until this past week when she became SOB despite dialysis. Of note, pt has a history of non-compliance with HD, but states that she reported for a full HD session on Monday and despite this session experienced profound SOB; as such, she presented to the ED for ongoing care. Pt denies recent sick contacts.  In the ED, pt noted to be hypertensive and tachypneic on RA w/ labs showing elevated BNP (>4500), mildly elevated troponins (30s), K 5.9 and Cr 11.1. Pt admitted to medicine with ongoing care. Nephrology consulted.  Review of Systems: As mentioned in the history of present illness. All other systems reviewed and are negative. Past Medical History:  Diagnosis Date   Abnormal ECG    a. In setting of LVH w/ repolarization abnormalities; b. 03/2019 MV: No ischemia/infarct. EF 55-65%.   Angioedema    ESRD (end stage renal disease) (HCC)    on HD (M,W,F)   GERD (gastroesophageal reflux disease)    Hypertension    Lupus    LVH (left ventricular hypertrophy)    a. 11/2017 Echo: EF 50-55%. triv AI. Mild MR. Mod L and mild R atrial enlargement. Nl RV size/fxn. Small pericardial effusion w/o tampondae; b. 03/2018 Echo: EF 55-60%, restrictive filling pattern. Nl RV size/fxn. Sev LAE. Mild MR, mild to mod TR/PR. Mod PAH. Triv effusion.   Migraines    Mixed connective tissue disease (HCC)    Previous Dialysis patient [facility])    a. Off HD since late 2020.   Septic prepatellar bursitis of right knee  08/19/2017   Past Surgical History:  Procedure Laterality Date   GRAFT APPLICATION Left 03/30/2019   Procedure: BRACHIAL CEPHALIC GRAFT CREATION;  Surgeon: Celso College, MD;  Location: [facility] ORS;  Service: Vascular;  Laterality: Left;   I & D EXTREMITY Right 08/06/2017   Procedure: IRRIGATION AND DEBRIDEMENT KNEE;  Surgeon: Laneta Pintos, MD;  Location: [facility] OR;  Service: Orthopedics;  Laterality: Right;   IR THROMBECTOMY AV FISTULA W/THROMBOLYSIS/PTA/STENT INC/SHUNT/IMG LT Left 05/18/2022   IR US  GUIDE VASC ACCESS LEFT  05/18/2022   MULTIPLE TOOTH EXTRACTIONS     Social History:  reports that she has been smoking e-cigarettes. She started smoking about 8 years ago. She has a 2.2 pack-year smoking history. She has never used smokeless tobacco. She reports that she does not drink alcohol and does not use drugs.  Allergies  Allergen Reactions   Iodine  Hives and Shortness Of Breath   Lisinopril  Anaphylaxis    Angioedema   Gabapentin Other (See Comments)    Reaction: Tremor (intolerance); tremor   Hydroxychloroquine  Other (See Comments)    Pt reports making her skin peel really bad   Vicodin [Hydrocodone -Acetaminophen ] Itching and Rash    Family History  Problem Relation Age of Onset   Lupus Mother    Hypertension Mother    Kidney disease Mother    Heart Problems Mother    Coronary artery disease Mother 22  stent   Diabetes Maternal Grandmother     Prior to Admission medications   Medication Sig Start Date End Date Taking? Authorizing Provider  amLODipine  (NORVASC ) 10 MG tablet Take 10 mg by mouth daily. 08/05/21  Yes [provider]  calcitRIOL  (ROCALTROL ) 0.25 MCG capsule Take 1 tablet by mouth every Monday, Wednesday, and Friday with hemodialysis.   Yes [provider]  carvedilol  (COREG ) 25 MG tablet Take 1 tablet (25 mg total) by mouth 2 (two) times daily with a meal. Patient taking differently: Take 100 mg by mouth daily. 09/03/23  Yes Samtani, Jai-Gurmukh,  MD  furosemide  (LASIX ) 80 MG tablet Take 1 tablet (80 mg total) by mouth daily. Patient taking differently: Take 160 mg by mouth daily. 12/23/23  Yes Deforest Fast, MD  irbesartan  (AVAPRO ) 300 MG tablet Take 300 mg by mouth daily.   Yes [provider]  medroxyPROGESTERone  (DEPO-PROVERA ) 150 MG/ML injection Inject 1 mL (150 mg total) into the muscle every 3 (three) months. 10/29/22  Yes Gabrielle Joiner, MD  naloxone [facility]) nasal spray 4 mg/0.1 mL Place 1 spray into the nose once. If needed for emergency overdose.   Yes [provider]  nitroGLYCERIN  (NITROSTAT ) 0.4 MG SL tablet Place 1 tablet (0.4 mg total) under the tongue every 5 (five) minutes as needed for chest pain. 10/28/23  Yes Rai, Ripudeep K, MD  ondansetron  (ZOFRAN -ODT) 4 MG disintegrating tablet Take 1 tablet (4 mg total) by mouth every 8 (eight) hours as needed for nausea or vomiting. 10/28/23  Yes Rai, Ripudeep K, MD  oxyCODONE  10 MG TABS Take 1 tablet (10 mg total) by mouth every 6 (six) hours as needed for pain. 12/24/23  Yes Deforest Fast, MD  tiZANidine  (ZANAFLEX ) 4 MG tablet Take 4 mg by mouth every 8 (eight) hours as needed for muscle spasms. 12/03/23  Yes [provider]    Physical Exam: Vitals:   01/11/24 0645 01/11/24 0700 01/11/24 0715 01/11/24 0956  BP: (!) 163/128 (!) 170/143 (!) 160/125 (!) 160/118  Pulse: 86 84 86 94  Resp: (!) 29 (!) 28 (!) 32   Temp: 97.9 F (36.6 C) 97.6 F (36.4 C)  98 F (36.7 C)  TempSrc: Oral Oral    SpO2: 100% 100% 100% 97%  Weight:      Height:       General: Alert, oriented x3, resting comfortably in no acute distress Respiratory: Lungs clear to auscultation bilaterally with normal respiratory effort; no w/r/r Cardiovascular: Regular rate and rhythm w/o m/r/g Abdomen: Soft, nontender, nondistended. Positive bowel sounds  Data Reviewed:  Lab Results  Component Value Date   WBC 9.5 01/11/2024   HGB 8.3 (L) 01/11/2024   HCT 27.0 (L) 01/11/2024    MCV 97.8 01/11/2024   PLT 217 01/11/2024   Lab Results  Component Value Date   GLUCOSE 77 01/11/2024   CALCIUM  9.7 01/11/2024   NA 138 01/11/2024   K 5.9 (H) 01/11/2024   CO2 20 (L) 01/11/2024   CL 98 01/11/2024   BUN 59 (H) 01/11/2024   CREATININE 11.10 (H) 01/11/2024   Lab Results  Component Value Date   ALT 9 01/11/2024   AST 23 01/11/2024   ALKPHOS 67 01/11/2024   BILITOT 0.9 01/11/2024   Lab Results  Component Value Date   INR 1.2 10/10/2023   INR 1.0 03/27/2019   INR 1.01 01/25/2018    Radiology: DG Chest Portable 1 View Result Date: 01/11/2024 CLINICAL DATA:  Shortness of breath. EXAM: PORTABLE  CHEST 1 VIEW COMPARISON:  12/21/2023 FINDINGS: The cardio pericardial silhouette is enlarged. Vascular congestion with basilar predominant interstitial opacity, suspicious for edema. No dense focal airspace consolidation or pleural effusion. No acute bony abnormality. Telemetry leads overlie the chest. IMPRESSION: Enlargement of the cardiopericardial silhouette with vascular congestion and basilar predominant interstitial opacity, suspicious for edema. Electronically Signed   By: Donnal Fusi M.D.   On: 01/11/2024 05:00    Assessment and Plan: 18F h/o SLE, ESRD on HD MWF, HTN, anemia, and chronic HFmrEF p/w AHRF iso volume overload c/b b/l buttock wounds.  AHRF 2/2 volume overload iso ESRD -Renal following; apprec eval/recs -IV lasix  80mg  daily for now -PTA calcitriol  0.25mg  MWF -F/u D dimer; if elevated, will consider CTA PE protocol -Wean O2 as tolerated  B/l buttock wounds Possible calciphylaxis -General surgery consulted for bedside debridement; apprec eval/recs -Defer abx for now pending imaging -F/u CT pelvis -IV dilaudid  0.5mg  q4h prn  HTN -PTA coreg  25mg  BID, irbesartan  300mg  daily, and amlodipine  10mg  daily   Advance Care Planning:   Code Status: Full Code   Consults: General surgery  Family Communication: N/A  Severity of Illness: The appropriate  patient status for this patient is INPATIENT. Inpatient status is judged to be reasonable and necessary in order to provide the required intensity of service to ensure the patient's safety. The patient's presenting symptoms, physical exam findings, and initial radiographic and laboratory data in the context of their chronic comorbidities is felt to place them at high risk for further clinical deterioration. Furthermore, it is not anticipated that the patient will be medically stable for discharge from the hospital within 2 midnights of admission.   * I certify that at the point of admission it is my clinical judgment that the patient will require inpatient hospital care spanning beyond 2 midnights from the point of admission due to high intensity of service, high risk for further deterioration and high frequency of surveillance required.*   ------- I spent 55 minutes reviewing previous labs/notes, obtaining separate history at the bedside, counseling/discussing the treatment plan outlined above, ordering medications/tests, and performing clinical documentation.  Author: Arne Langdon, MD 01/11/2024 11:26 AM  For on call review [email or web address].

## 2024-01-11 NOTE — ED Notes (Signed)
 Pt requested pain and nausea meds. Electronic request sent to ED provider

## 2024-01-11 NOTE — Consult Note (Signed)
 Renal Service Consult Note [facility]  Linda [name] 01/11/2024 [name], MD Requesting Physician: Dr. Sulema Endo  Reason for Consult: ESRD patient with shortness of breath HPI: The patient is a 28 y.o. year-old w/ PMH as below who presented to ED complaining of buttock wounds and shortness of breath.  In the ED patient was hypertensive and tachypneic on room air.  Blood pressure 163/119, HR 96, RR 22, temp 98, 100% sats on room air.  Labs showed potassium 5.9 BUN 59, creatinine 11.1, albumin  3.2, BNP 45,000, hemoglobin 8.3.  Chest x-ray showed severe cardiomegaly which is not new and vascular congestion, questionable interstitial edema.  Patient was admitted and we are asked to see for dialysis.   Pt seen in the hospital room.  She is on nasal cannula O2 with no complaints except for the wounds on her arms and buttocks.  She is also been having shortness of breath that is not improving with dialysis.   ROS - denies CP, no joint pain, no HA, no blurry vision, no rash, no diarrhea, no nausea/ vomiting  PMH: ESRD on HD Hypertension SLE MCTD History of angioedema  Past Surgical History  Past Surgical History:  Procedure Laterality Date   GRAFT APPLICATION Left 03/30/2019   Procedure: BRACHIAL CEPHALIC GRAFT CREATION;  Surgeon: Celso College, MD;  Location: [facility] ORS;  Service: Vascular;  Laterality: Left;   I & D EXTREMITY Right 08/06/2017   Procedure: IRRIGATION AND DEBRIDEMENT KNEE;  Surgeon: Laneta Pintos, MD;  Location: [facility] OR;  Service: Orthopedics;  Laterality: Right;   IR THROMBECTOMY AV FISTULA W/THROMBOLYSIS/PTA/STENT INC/SHUNT/IMG LT Left 05/18/2022   IR US  GUIDE VASC ACCESS LEFT  05/18/2022   MULTIPLE TOOTH EXTRACTIONS     Family History  Family History  Problem Relation Age of Onset   Lupus Mother    Hypertension Mother    Kidney disease Mother    Heart Problems Mother    Coronary artery disease Mother 70       stent   Diabetes Maternal  Grandmother    Social History  reports that she has been smoking e-cigarettes. She started smoking about 8 years ago. She has a 2.2 pack-year smoking history. She has never used smokeless tobacco. She reports that she does not drink alcohol and does not use drugs. Allergies  Allergies  Allergen Reactions   Iodine  Hives and Shortness Of Breath   Lisinopril  Anaphylaxis    Angioedema   Gabapentin Other (See Comments)    Reaction: Tremor (intolerance); tremor   Hydroxychloroquine  Other (See Comments)    Pt reports making her skin peel really bad   Vicodin [Hydrocodone -Acetaminophen ] Itching and Rash   Home medications Prior to Admission medications   Medication Sig Start Date End Date Taking? Authorizing Provider  amLODipine  (NORVASC ) 10 MG tablet Take 10 mg by mouth daily. 08/05/21  Yes [provider]  calcitRIOL  (ROCALTROL ) 0.25 MCG capsule Take 1 tablet by mouth every Monday, Wednesday, and Friday with hemodialysis.   Yes [provider]  carvedilol  (COREG ) 25 MG tablet Take 1 tablet (25 mg total) by mouth 2 (two) times daily with a meal. Patient taking differently: Take 100 mg by mouth daily. 09/03/23  Yes Samtani, Jai-Gurmukh, MD  furosemide  (LASIX ) 80 MG tablet Take 1 tablet (80 mg total) by mouth daily. Patient taking differently: Take 160 mg by mouth daily. 12/23/23  Yes Deforest Fast, MD  irbesartan  (AVAPRO ) 300 MG tablet Take 300 mg by mouth daily.   Yes  [provider]  medroxyPROGESTERone  (DEPO-PROVERA ) 150 MG/ML injection Inject 1 mL (150 mg total) into the muscle every 3 (three) months. 10/29/22  Yes Gabrielle Joiner, MD  naloxone [facility]) nasal spray 4 mg/0.1 mL Place 1 spray into the nose once. If needed for emergency overdose.   Yes [provider]  nitroGLYCERIN  (NITROSTAT ) 0.4 MG SL tablet Place 1 tablet (0.4 mg total) under the tongue every 5 (five) minutes as needed for chest pain. 10/28/23  Yes Rai, Ripudeep K, MD  ondansetron   (ZOFRAN -ODT) 4 MG disintegrating tablet Take 1 tablet (4 mg total) by mouth every 8 (eight) hours as needed for nausea or vomiting. 10/28/23  Yes Rai, Ripudeep K, MD  oxyCODONE  10 MG TABS Take 1 tablet (10 mg total) by mouth every 6 (six) hours as needed for pain. 12/24/23  Yes Deforest Fast, MD  tiZANidine  (ZANAFLEX ) 4 MG tablet Take 4 mg by mouth every 8 (eight) hours as needed for muscle spasms. 12/03/23  Yes [provider]     Vitals:   01/11/24 0645 01/11/24 0700 01/11/24 0715 01/11/24 0956  BP: (!) 163/128 (!) 170/143 (!) 160/125 (!) 160/118  Pulse: 86 84 86 94  Resp: (!) 29 (!) 28 (!) 32   Temp: 97.9 F (36.6 C) 97.6 F (36.4 C)  98 F (36.7 C)  TempSrc: Oral Oral    SpO2: 100% 100% 100% 97%  Weight:      Height:       Exam Gen alert, no distress, [place] O2 No rash, cyanosis or gangrene Sclera anicteric, throat clear  No jvd or bruits Chest clear bilat to bases, no rales/ wheezing RRR no MRG Abd soft ntnd no mass or ascites +bs GU defer MS no joint effusions or deformity Ext no LE or UE edema, no other edema Neuro is alert, Ox 3 , nf     AVG +bruit       Renal-related home meds: Norvasc  10 daily Coreg  25 twice daily Lasix  80 mg 2 pills daily Irbesartan  300 MGs daily   OP HD: AF MWF  3.5h   B350   71.5kg  AVG   heparin  none    Assessment/ Plan: SOB: suspected vol overload, early IS edema on CXR. Stable on [place] O2. Will try to get done tonight if staffing allows, vs in am tomorrow.  ESRD: on HD MWF. HD as above.  HTN: bps very high, cont meds and get vol down w/ HD Volume: as above Anemia of esrd: Hb 8-9, follow Secondary hyperparathyroidism: Ca in range, add phos   Linda Poag  MD [facility] 01/11/2024, 4:18 PM  Recent Labs  Lab 01/11/24 0347  HGB 8.3*  ALBUMIN  3.2*  CALCIUM  9.7  CREATININE 11.10*  K 5.9*   Inpatient medications:  amLODipine   10 mg Oral Daily   [START ON 01/12/2024] calcitRIOL   0.25 mcg Oral Q M,W,F-HD   carvedilol   25 mg Oral  BID WC   furosemide   80 mg Intravenous Daily   heparin   5,000 Units Subcutaneous Q8H   irbesartan   300 mg Oral Daily    HYDROmorphone  (DILAUDID ) injection

## 2024-01-11 NOTE — ED Provider Notes (Signed)
 Patient admitted to the hospitalist service for ESRD/CHF- hypoxic resp failure needs dialysis Physical Exam  BP (!) 160/118   Pulse 94   Temp 98 F (36.7 C)   Resp (!) 32   Ht 5\' 6"  (1.676 m)   Wt 72 kg   SpO2 97%   BMI 25.62 kg/m   Physical Exam  Procedures  Procedures  ED Course / MDM   Clinical Course as of 01/11/24 1554  Tue Jan 11, 2024  0410 Hemoglobin(!): 8.3 Consistent with baseline hemoglobin between 8-10. [OZ]  0510 DG Chest Portable 1 View Enlargement of the cardiopericardial silhouette with vascular congestion and basilar predominant interstitial opacity, suspicious for edema.   [OZ]  0526 Potassium(!): 5.9 Elevated. No EKG changes. [OZ]  [id number] B Natriuretic Peptide(!): >4,500.0 Will likely require emergent dialysis. [OZ]  0728 Dr. Zana Hesselbach aware of patient need for HD- will consult on the patient [AH]    Clinical Course User Index [AH] Kabir Brannock, PA-C [OZ] Zelaya, Oscar A, PA-C   Medical Decision Making Amount and/or Complexity of Data Reviewed Labs: ordered. Decision-making details documented in ED Course. Radiology: ordered. Decision-making details documented in ED Course.  Risk Prescription drug management. Decision regarding hospitalization.          Linda Fails, PA-C 01/11/24 1554    Burnette Carte, MD 01/12/24 [phone]

## 2024-01-11 NOTE — Consult Note (Signed)
 WOC consulted for bilateral gluteal abscess, general surgery also consulted, verified with [facility] they are seeing patient, we will not consult for that reason.   If further needs arise, please re-consult.   Sandor Arboleda [facility], CNS, [employer] [phone]

## 2024-01-11 NOTE — ED Triage Notes (Addendum)
 PT BIB EMS from home. CC SOB x1wk.  MWF dialysis, completed full dialysis Monday, compliant with meds.  Pt says she feels bloated and has been hot/cold, but denies fever/chills  EMS VS 160/100, 98% 3L [place] (no oxygen @ baseline), HR 96

## 2024-01-12 ENCOUNTER — Inpatient Hospital Stay (HOSPITAL_COMMUNITY)

## 2024-01-12 DIAGNOSIS — Z1152 Encounter for screening for COVID-19: Secondary | ICD-10-CM | POA: Diagnosis not present

## 2024-01-12 DIAGNOSIS — E8779 Other fluid overload: Secondary | ICD-10-CM | POA: Diagnosis not present

## 2024-01-12 DIAGNOSIS — Z8249 Family history of ischemic heart disease and other diseases of the circulatory system: Secondary | ICD-10-CM | POA: Diagnosis not present

## 2024-01-12 DIAGNOSIS — E8770 Fluid overload, unspecified: Secondary | ICD-10-CM | POA: Diagnosis present

## 2024-01-12 DIAGNOSIS — F17210 Nicotine dependence, cigarettes, uncomplicated: Secondary | ICD-10-CM | POA: Diagnosis present

## 2024-01-12 DIAGNOSIS — Z885 Allergy status to narcotic agent status: Secondary | ICD-10-CM | POA: Diagnosis not present

## 2024-01-12 DIAGNOSIS — Z886 Allergy status to analgesic agent status: Secondary | ICD-10-CM | POA: Diagnosis not present

## 2024-01-12 DIAGNOSIS — D631 Anemia in chronic kidney disease: Secondary | ICD-10-CM | POA: Diagnosis present

## 2024-01-12 DIAGNOSIS — I2489 Other forms of acute ischemic heart disease: Secondary | ICD-10-CM | POA: Diagnosis present

## 2024-01-12 DIAGNOSIS — I120 Hypertensive chronic kidney disease with stage 5 chronic kidney disease or end stage renal disease: Secondary | ICD-10-CM | POA: Diagnosis not present

## 2024-01-12 DIAGNOSIS — R140 Abdominal distension (gaseous): Secondary | ICD-10-CM | POA: Diagnosis present

## 2024-01-12 DIAGNOSIS — I5043 Acute on chronic combined systolic (congestive) and diastolic (congestive) heart failure: Secondary | ICD-10-CM | POA: Diagnosis present

## 2024-01-12 DIAGNOSIS — N179 Acute kidney failure, unspecified: Secondary | ICD-10-CM

## 2024-01-12 DIAGNOSIS — M329 Systemic lupus erythematosus, unspecified: Secondary | ICD-10-CM | POA: Diagnosis not present

## 2024-01-12 DIAGNOSIS — E875 Hyperkalemia: Secondary | ICD-10-CM | POA: Diagnosis present

## 2024-01-12 DIAGNOSIS — L732 Hidradenitis suppurativa: Secondary | ICD-10-CM | POA: Diagnosis present

## 2024-01-12 DIAGNOSIS — I132 Hypertensive heart and chronic kidney disease with heart failure and with stage 5 chronic kidney disease, or end stage renal disease: Secondary | ICD-10-CM | POA: Diagnosis present

## 2024-01-12 DIAGNOSIS — Z91158 Patient's noncompliance with renal dialysis for other reason: Secondary | ICD-10-CM | POA: Diagnosis not present

## 2024-01-12 DIAGNOSIS — Z91041 Radiographic dye allergy status: Secondary | ICD-10-CM | POA: Diagnosis not present

## 2024-01-12 DIAGNOSIS — Z992 Dependence on renal dialysis: Secondary | ICD-10-CM | POA: Diagnosis not present

## 2024-01-12 DIAGNOSIS — N2581 Secondary hyperparathyroidism of renal origin: Secondary | ICD-10-CM | POA: Diagnosis not present

## 2024-01-12 DIAGNOSIS — N186 End stage renal disease: Secondary | ICD-10-CM | POA: Diagnosis not present

## 2024-01-12 DIAGNOSIS — Z8269 Family history of other diseases of the musculoskeletal system and connective tissue: Secondary | ICD-10-CM | POA: Diagnosis not present

## 2024-01-12 DIAGNOSIS — L0231 Cutaneous abscess of buttock: Secondary | ICD-10-CM | POA: Diagnosis not present

## 2024-01-12 DIAGNOSIS — Z833 Family history of diabetes mellitus: Secondary | ICD-10-CM | POA: Diagnosis not present

## 2024-01-12 DIAGNOSIS — M109 Gout, unspecified: Secondary | ICD-10-CM | POA: Diagnosis present

## 2024-01-12 DIAGNOSIS — Z888 Allergy status to other drugs, medicaments and biological substances status: Secondary | ICD-10-CM | POA: Diagnosis not present

## 2024-01-12 LAB — HEPATITIS PANEL, ACUTE
HCV Ab: NONREACTIVE
Hep A IgM: NONREACTIVE
Hep B C IgM: NONREACTIVE
Hepatitis B Surface Ag: NONREACTIVE

## 2024-01-12 MED ORDER — HYDROMORPHONE HCL 1 MG/ML IJ SOLN
INTRAMUSCULAR | Status: AC
Start: 2024-01-12 — End: ?
  Filled 2024-01-12: qty 0.5

## 2024-01-12 NOTE — Care Management Obs Status (Signed)
 MEDICARE OBSERVATION STATUS NOTIFICATION   Patient Details  Name: Linda [name] MRN: [id number] Date of Birth: [birth year]   Medicare Observation Status Notification Given:  Yes  [place]/Obs letter not given a patient is too ill to sign  Wynonia Hedges 01/12/2024, 9:17 AM

## 2024-01-12 NOTE — Discharge Instructions (Addendum)
 [employer] assistance programs Crisis assistance programs  -[facility] Arts development officer. If you are experiencing homelessness in [place], [place] , your first point of contact should be Pensions consultant. You can reach Coordinated Entry by calling (336) [phone] or by emailing [email or web address].  Community access points: [employer] 450-800-[address], [place]) every Tuesday from 9am-10am. [facility] ([address] [place]) every Wednesday from 8am-9am.   -[place] Coordinated Re-entry Jayson Michael: Dial  211 and request. Offers referrals to homeless shelters in the area.    -The [employer] 9170224331) offers several services to local families, as funding allows. The Emergency Assistance Program (EAP), which they administer, provides household goods, free food, clothing, and financial aid to people in need in the [place]  area. The EAP program does have some qualification, and counselors will interview clients for financial assistance by written referral only. Referrals need to be made by the Department of Social Services or by other EAP approved human services agencies or charities in the area.  -Open [facility] Minist[facility] of [employer], which can be reached at [phone], offers emergency assistance programs for those in need of help, such as food, rent assistance, a soup kitchen, shelter, and clothing. They are based in [facility]  but provide a number of services to those that qualify for assistance.   [facility] Department of Social Services may be able to offer temporary financial assistance and cash grants for paying rent and utilities, Help may be provided for local county residents who may be experiencing personal crisis when other resources, including government programs, are not available. Call [phone]  -High [employer] is a [employer] agency, The organization can offer emergency assistance for paying rent, Care[employer] Rx, utilities, food, household products and furniture. They offer extensive emergency and transitional housing for families, children and single women, and also run a Boy's and [employer]. Thrift Shops, Secondary school teacher, and other aid offered too. [address] [place], [place]  [address], [phone]  -[place] Low Income Energy Assistance Program -- This is offered for [facility] families. The federal government created [employer] Program provides a one-time cash grant payment to help eligible low-income families pay their electric and heating bills. [address] [place], [place]  [address], [phone]  -[place] Emergency Assistance -- A program offers emergency utility and rent funds for greater [employer] area residents. The program can also provide counseling and referrals to charities and government programs. Also provides food and a free meal program that serves lunch Mondays - Saturdays and dinner seven days per week to individuals in the community. [address], [place], [place]  [address], [phone]  -[employer] - Offers affordable apartment and housing communities across      [place] and [place]. The low income and seniors can access public housing, rental assistance to qualified applicants, and apply for the section 8 rent subsidy program. Other programs include Chiropractor and Engineer, maintenance. [address] [place], [place]  [address], dial  [phone].  -The [facility] provides transitional housing to veterans and the disabled. Clients will also access other services too, including assistance in applying for Disability, life skills classes, case management, and assistance in finding permanent housing. [address] [place], [place]  [place] [address], call [phone]  -[place] Transitional Housing through [facility] is for people who were just  evicted or that are formerly homeless. The non-profit will also help then gain self-sufficiency, find a home or apartment to live in, and also provides information on rent assistance when needed. Phone [phone]  -The [place]  [place] [employer] Program helps low income, elderly, or disabled residents in seven counties in the [place]  ([place], [place], [place], [place], [place], [place], [place], and [place]) save energy and reduce their utility bills by improving energy efficiency. Phone [phone].  -[employer] is located in the [place] [facility] in the [employer], [address], [place], [place] [address]. Parking is in the rear of the building. Phone: [phone]   General Email: info@gsohc .org  GHC provides free housing counseling assistance in locating affordable rental housing or housing with support services for families and individuals in crisis and the chronically homeless. We provide potential resources for other housing needs like utilities. Our trained counselors also work with clients on budgeting and financial literacy in effort to empower them to take control of their financial situations. [employer] collaborates with homeless service providers and other stakeholders as part of the [employer] COC (Continuum of Care). The (COC) is a regional/local planning body that coordinates housing and services funding for homeless families and individuals. The role of [facility] in the COC is through housing counseling to work with people we serve on diversion strategies for those that are at imminent risk of becoming homeless. We also work with the Coordinated Assessment/Entry Specialist who attempts to find temporary solutions and/or connects the people  to Housing First, Rapid Re-housing or transitional housing programs. Our Homelessness Prevention Housing Counselors meet with clients on business days (Monday-Fridays, except scheduled holidays) from 8:30 am to 4:30 pm.  Legal assistance for evictions, foreclosure, and more -If you need free legal advice on civil issues, such as foreclosures, evictions, Electronics engineer, government programs, domestic issues and more, Landscape architect of [place]  [facility]) is a Associate Professor firm that provides free legal services and counsel to lower income people, seniors, disabled, and others, The goal is to ensure everyone has access to justice and fair representation. Call them at ([phone].  [facility] for Housing and Community Studies can provide info about obtaining legal assistance with evictions. Phone ([phone].  Data processing manager  The [employer], [employer]. offers job and Dispensing optician. Resources are focused on helping students obtain the skills and experiences that are necessary to compete in today's challenging and tight job market. The non-profit faith-based community action agency offers internship trainings as well as classroom instruction. Classes are tailored to meet the needs of people in the [facility] region. [place], [place] [address], [phone]  Foreclosure prevention/Debt Services Family Services of the [employer] Credit Counseling Service inludes debt and foreclosure prevention programs for local families. This includes money management, financial advice, budget review and development of a written action plan with a Pensions consultant to help solve specific individual financial problems. In addition, housing and mortgage counselors can also provide pre- and post-purchase homeownership counseling, default resolution counseling (to prevent foreclosure) and reverse mortgage counseling. A Debt Management Program allows  people and families with a high level of credit card or medical debt to consolidate and repay consumer debt and loans to creditors and rebuild positive credit ratings and scores. Contact ([phone] F1555895.  Community clinics in [place] -Health Department [facility]: [address] [place], [place], [address]. [phone].  -Health Department [place] [facility]: [phone]  [address], [facility], [address]. [phone].  -[facility] offers medical care through a group of doctors, pharmacies and other healthcare related agencies that offer services for low income, uninsured adults in [place]. Also offers adult Dental care and assistance with applying for an [employer]. Call [phone].   [facility]. This center provides low-cost health care to those without health insurance. Services offered include an onsite pharmacy. Phone [phone]. [address], [place].  -Medication Assistance Program serves as a link between pharmaceutical companies and patients to provide low cost or free prescription medications. This service is available for residents who meet certain income restrictions and have no insurance coverage. PLEASE CALL [phone] Jonette Nestle) OR [phone] ([place])  -One Step Further: Materials engineer, The [employer] Support & Nutrition Program, [employer]. Call [phone]/ [phone].  Food pantry and assistance -Urban Ministry-Food Bank: [address], [place] [address]. Phone [phone]  -Bless[facility] Food Pantry: [address] [place], [place] [address]. [phone].  -Missionary Ministry: has the purpose of visiting the sick and shut-ins and provide for needs in the surrounding communities. Call [phone]. Email: [email or web address] This program provides: Food box for seniors, Financial assistance, Food to meet basic  nutritional needs.  -Meals on Wheels with Senior Resources: [facility] residents age 20 and over who are homebound and unable to obtain and prepare a nutritious meal for themselves are eligible for this service. There may be a waiting list in certain parts of [facility] if the route in that area is full. If you are in [facility] and [place] call [phone] to register. For all other areas call [phone] to register.  -Greater Dietitian: https://findfood.BargainContractor.[email or web address]  TRANSPORTATION: -[employer] Department of Health: Call [facility] and [employer] at ([phone] for details. AttractionGuides.es  -Access [place]: Access [place] is the [employer] Agency's shared-ride transportation service for eligible riders who have a disability that prevents them from riding the fixed route bus. Call [phone]. Access [place] riders must pay a fare of $1.50 per trip, or may purchase a 10-ride punch card for $14.00 ($1.40 per ride) or a 40-ride punch card for $48.00 ($1.20 per ride).  -The Shepherd's WHEELS rideshare transportation service is provided for senior citizens (60+) who live independently within [place] city limits and are unable to drive or have limited access to transportation. Call [phone] to schedule an appointment.  -[place] Transportation: For Medicare or Medicaid recipients call [phone]?[facility], wheelchair Carloyn Chi, and ambulatory quotes available.   FLEEING VIOLENCE: -Family Services of the [place]- 24/7 Crisis line ([phone])[phone]) -[facility] Justice Centers: (336) 641-SAFE [phone])  [place] 2-1-1 is another useful way to locate resources in the community. Visit ShedSizes.ch to find service information online. If you need additional assistance, 2-1-1 Referral Specialists are available 24 hours a day, every day by dialing  2-1-1 or [phone] from any phone. The call is free, confidential, and available in any language.  Affordable Housing Search http://www.nchousingsearch.[facility])   M-F 8a-3p [address]  [place], [place] [address] [phone] Services include: laundry, barbering, support groups, case management, phone & computer access, showers, AA/NA mtgs, mental health/substance abuse nurse, job skills class, disability information, VA assistance, spiritual classes, etc. Winter Shelter available when temperatures are less than 32 degrees.   HOMELESS SHELTERS [facility] Night Shelter at [facility]- Call [phone] ext. [phone]  or ext. [phone]. Located at [address], [place], [place] [address]  [facility] Mens Shelter- Call [phone]. Located at [address] [place] [address].  [facility] [employer]. Call [phone]. Office located at [address], [place] [address].  Pathways Family Housing through [facility] ([phone].  [facility] Family Shelter- Call [phone]. Located at [address] [place], [place], [place] [address].  Room at the Inn-For Pregnant mothers. Call [phone]. Located at [address] [place], [address].  [facility]-For men in [place]. Call [phone]. [address]-Shelter in [place]. Call [phone].  Home of [employer] for [employer] ([phone]. Office located at [address] [place], [address].  [employer] be agreeable to help with chores. Call [phone] ext. [phone].  Men's: [address] [place], [place] [address]. Women's: [facility]  [address] [place], [place] [address]  Crisis Services Therapeutic Alternatives Mobile Crisis Management- [phone]  [facility] [address] [place], [place] [address]. Phone: [phone] Rent/Utility Assistance in  [facility]:  INNOVATIVE PATHWAYS [address] [place], [place] [address] [phone] Mon 8:00am - 6:00pm; Tue 8:00am - 6:00pm; Wed 8:00am - 6:00pm; Thu 8:00am - 6:00pm; Fri 8:00am - 6:00pm; Email: [email or web address] Eligibility: Residents of [place], [place], [place], [place], [place] and [place] that meet income limits. Call or text for eligibility screening.   [facility] MINISTRY [address] [place], [place], [place] [address] [phone] (Main: Rental Assistance) [phone] (Main: Utility Assistance) Mon 8:30am - 5:00pm; Tue 8:30am - 5:00pm; Wed 8:30am - 5:00pm; Thu 8:30am - 5:00pm; Fri 8:30am - 5:00pm; Website: http://www.greensborourbanministry.org/emergency-assistance-program Eligibility: People who have an unexpected crisis or emergency that can be verified. Must have some form of income and meet income limits. At the first of the month, only helps with rent/mortgage assistance for those who have court ordered eviction notices. Call for application information. Call for exact documents that will be needed. Examples of documents that may be needed: Photo ID, Social Security cards for everyone in the household, and proof of income for previous 2 months. Copy of eviction notice for rent assistance and copy of final notice for utility assistance. Statements or receipts of bills for previous 2 months.   [facility] - [place] [address], [place], [place] [address] [phone] (Main) [phone] (Alternate) Mon 9:00am - 5:00pm; Tue 9:00am - 5:00pm; Wed 9:00am - 5:00pm; Thu 9:00am - 5:00pm; Fri 9:00am - 5:00pm; Website: http://[email or web address].[email or web address]/[place]/emergency-financial-assistance Email: [email or web address] Eligibility: People experiencing a housing crisis with past-due rent and/or utilities and meet income limits. Must be willing to take part in 6 Call or visit website to download  application. Return complete application by mail or email only. Documents: Help with Utilities: Photo ID, proof of household income, copies of monthly bills or receipts, and a final disconnection/shut-off notice. Help with Rent or Mortgage: Photo ID, proof of income, copies of monthly bills or receipts, and eviction notice. Help with Household Goods: Photo ID, proof of household income, copies of monthly bills or receipts, and a fire or flood report.  [facility] [place] [address], [place], [place] [address] [phone] (Main) Mon 8:00am - 5:00pm; Tue 8:00am - 5:00pm; Wed 8:00am - 5:00pm; Thu 8:00am - 5:00pm; Fri 8:00am - 12:00pm; Website: http://[email or web address].salvationarmy.org/[place]/emergency-financial-assistance Email: [email or web address] Call for eligibility information. Apply :Utilities Assistance: Visit office by 8:30am on 1st and 4th Monday of each  month to pick up application. Rent and Mortgage Assistance: Visit office by 8:30am on 2nd and 3rd Monday of each month to pick up application. NOTE: If Monday falls on a holiday applications can be picked up the following Tuesday. Documents required will be listed on application.  [facility] [phone] (Main) Seen by appointment only. Call for more information. Eligibility: Meet income limits. Apply: Call for information on how to schedule an appointment. Each month there is a specific day to call to schedule an appointment. It is stated on the agency voicemail message. Appointments fill up quickly each month. Documents: Photo ID, copy of current utility bill.  [facility] [place] [address] [place], [place] [address] [phone] (Main) Tue 9:00am - 4:00pm; Wed 9:00am - 4:00pm; Thu 9:00am - 4:00pm; Website: http://www.[email or web address] Email: [email or web address]@gmail [email or web address] Eligibility: Utility Assistance: Meet income limits and be a Haematologist. [employer] customers do not qualify. Must not have received utility assistance for another agency within the last 90 days. Rent Assistance: Residents of [place] who meet income limits. Must not have received rent assistance for another agency within the last 90 days. Apply: Call to schedule an appointment. Documents: Utility Assistance: Photo ID, City of [employer], copy of lease (if not paying a mortgage), proof of income, and monthly expenses. Rent Assistance: Photo ID, W-9 from the landlord, copy of the lease, proof of income, and a list of monthly expenses.  [facility] [place] [address] [place], [place] [address] [phone] (Main: Help With Rent) [phone] (Main: Help With Utilities) Mon 9:00am - 4:00pm; Tue 9:00am - 4:00pm; Wed 9:00am - 4:00pm; Thu 9:00am - 4:00pm; Fri 9:00am - 4:00pm; Website: MotivationalSites.no Email: [email or web address] Eligibility: People experiencing a financial crisis. Apply: Call to schedule an appointment Wednesday, 7:30am. Documents: Photo ID, Social Security card, proof of income, and proof of address. Other documents may be required, depending on service. Call for more information.  LOW INCOME ENERGY ASSISTANCE PROGRAM DEPARTMENT OF SOCIAL SERVICES - [facility] [address] [place], [place] [address] [phone] (Main) Mon 8:00am - 5:00pm; Tue 8:00am - 5:00pm; Wed 8:00am - 5:00pm; Thu 8:00am - 5:00pm; Fri 8:00am - 5:00pm; Website: [email or web address] Eligibility: Meet income limits and resource guidelines. Each household is only eligible once, even if multiple members apply. Apply: Call to see if funds are available. Visit to complete an application, call to have 1 mailed, or apply online at [email or web address] NOTE: Households with a person age 53  and over or a person with a documented disability can apply beginning December 1. Other households can apply beginning January 1. Documents: Photo ID, birth certificate, proof of household income, copy of utility bill, latest bank statement, the names and Social Security numbers for everyone in the household, and proof of disability if under age 89.  LOW INCOME ENERGY ASSISTANCE PROGRAM DEPARTMENT OF SOCIAL SERVICES - [facility] [address] [place], [place], [place] [address] [phone] (Main) Mon 8:00am - 5:00pm; Tue 8:00am - 5:00pm; Wed 8:00am - 5:00pm; Thu 8:00am - 5:00pm; Fri 8:00am - 5:00pm; Website: [email or web address] Eligibility: Meet income limits and resource guidelines. Each household is only eligible once, even if multiple members apply. Apply: Call to see if funds are available. Visit to complete an application, call to have 1 mailed, or apply online at [email or web address] NOTE: Households with a person age 79 and over or a person with a documented  disability can apply beginning December 1. Other households can apply beginning January 1. Documents: Photo ID, birth certificate, proof of household income, copy of utility bill, latest bank statement, the names and Social Security numbers for everyone in the household, and proof of disability if under age 54.  TRANSPORTATION: -[place] Department of Health: Call [facility] and [employer] at [phone] for details. AttractionGuides.es  -Access [place]: Access [place] is the [employer]'s shared-ride transportation service for eligible riders who have a disability that prevents them from riding the fixed route bus. Call [phone]. Access [place] riders must pay a fare of $1.50 per trip, or may purchase a 10-ride punch card for $14.00 ($1.40 per ride) or a  40-ride punch card for $48.00 ($1.20 per ride).  -The Shepherd's WHEELS rideshare transportation service is provided for senior citizens (60+) who live independently within [place] city limits and are unable to drive or have limited access to transportation. Call [phone] to schedule an appointment.  -[place] Transportation: For Medicare or Medicaid recipients call [phone]?[facility], wheelchair Carloyn Chi, and ambulatory quotes available.   MEDICAID TRANSPORTATION: -If you have a Medicaid "blue card" or "pink card" and have no other means for transportation to doctor's offices, clinics, dentists, hospitals, and other health related trip needs.  -Transportation services are available to all [place] locations. Trips to [facility] and [place] are provided in association with PART. -Services are provided between 6:00AM and 9:00PM Monday-Friday. -Call [phone] to schedule a trip or request further information.  UHC INSURANCE TRANSPORTATION BENEFITS:   [phone]

## 2024-01-12 NOTE — Progress Notes (Signed)
 Received patient in bed to unit.  Alert and oriented x4  Informed consent signed and in chart. TX duration: 3.25H   Patient tolerated well.  Transported back to the room Alert and oriented x 4, conscious and coherent without acute distress.  Hand-off given to patient's nurse.   Access used: AVG  Access issues: none  Total UF removed:  Medication(s) given: Dilaudid  0.5 mg IV for pain  Post HD VS: 149/111  Post HD weight: UTA d/t bed not functioning      01/12/24 0701  Vitals  Temp 98.1 F (36.7 C)  Temp Source Oral  BP (!) 157/111  MAP (mmHg) 126  BP Location Right Arm  BP Method Automatic  Patient Position (if appropriate) Lying  Pulse Rate 91  ECG Heart Rate 92  Resp (!) 25  Oxygen Therapy  SpO2 100 %  O2 Device Nasal Cannula  O2 Flow Rate (L/min) 3 L/min  Patient Activity (if Appropriate) In bed  Pulse Oximetry Type Continuous  During Treatment Monitoring  Blood Flow Rate (mL/min) 0 mL/min  Arterial Pressure (mmHg) -18.38 mmHg  Venous Pressure (mmHg) 232.51 mmHg  TMP (mmHg) 34.34 mmHg  Ultrafiltration Rate (mL/min) 1134 mL/min  Dialysate Flow Rate (mL/min) 300 ml/min  Dialysate Potassium Concentration 2  Dialysate Calcium  Concentration 2.5  Duration of HD Treatment -hour(s) 3.42 hour(s)  Cumulative Fluid Removed (mL) per Treatment  3000.16  HD Safety Checks Performed Yes  Intra-Hemodialysis Comments Tx completed  Post Treatment  Dialyzer Clearance Lightly streaked  Hemodialysis Intake (mL) 0 mL  Liters Processed 61.5  Fluid Removed (mL) 3000 mL  Tolerated HD Treatment Yes  Post-Hemodialysis Comments HD treatment goal met, UF goal met. Pt conscious and coherent  AVG/AVF Arterial Site Held (minutes) 10 minutes  AVG/AVF Venous Site Held (minutes) 10 minutes  Note  Patient Observations Pt AxO x4, Post HD treatment cares done.  Fistula / Graft Left Upper arm  No placement date or time found.   Orientation: Left  Access Location: Upper arm  Site  Condition No complications  Fistula / Graft Assessment Present;Thrill;Bruit  Status Deaccessed    Viviano Bir E. Sankalp Ferrell, BSN, RN [facility]

## 2024-01-12 NOTE — Progress Notes (Signed)
 PROGRESS NOTE  Linda [name] ZOX:[id number] DOB: [birth year]   PCP: Kline, Chianne, PA-C  Patient is from: Home.  Lives with grandmother.  DOA: 01/11/2024 LOS: 0  Chief complaints Chief Complaint  Patient presents with   Shortness of Breath     Brief Narrative / Interim history: 28 year old F with PMH of ESRD on HD MWF, SLE, HFmrEF, HTN and anemia presenting with shortness of breath and chronic right buttock pain likely due to chronic right buttock mass from possible calciphylaxis.  Patient reports full HD session on Monday. In ED, elevated BP to 160/120.  100% on room air. BNP > 4500.  K5.9.  Hgb 8.3 (about baseline).  No leukocytosis.  Nephrology consulted.  General surgery consulted for right buttock mass/pain.  CT pelvis ordered.  Subjective: Seen and examined earlier this morning.  Had HD with ultrafiltration of 3 L.  Reports improvement in her breathing.  She is sleepy but wakes to voice.  Continues to endorse right buttock/thigh pain  Objective: Vitals:   01/12/24 0636 01/12/24 0651 01/12/24 0701 01/12/24 0706  BP: (!) 153/108 (!) 142/108 (!) 157/111 (!) 149/111  Pulse: 82 87 91 90  Resp: 18 (!) 21 (!) 25 (!) 21  Temp:   98.1 F (36.7 C)   TempSrc:   Oral   SpO2: 99% 99% 100% 100%  Weight:      Height:        Examination:  GENERAL: No apparent distress.  Nontoxic. HEENT: MMM.  Vision and hearing grossly intact.  NECK: Supple.  No apparent JVD.  RESP:  No IWOB.  Fair aeration bilaterally. CVS:  RRR. Heart sounds normal.  ABD/GI/GU: BS+. Abd soft, NTND.  MSK/EXT:  Moves extremities. No apparent deformity. No edema.  SKIN: Palpable mass over proximal right thigh posteriorly.  Tender to palpation.  No fluctuance. NEURO: Awake, alert and oriented appropriately.  No apparent focal neuro deficit. PSYCH: Calm. Normal affect.   Consultants:  Nephrology General Surgery  Procedures: None  Microbiology summarized: COVID-19, influenza and RSV PCR  nonreactive  Assessment and plan: Respiratory distress due to volume overload in patient with ESRD: Improved after dialysis. -S/p HD with ultrafiltration of 3 L -Continue IV Lasix  -Wean oxygen as able. -IS   Right posterior thigh/buttock mass: This is chronic and present for over a year.  She has other small lesions on the arms as well concerned about calciphylaxis.  CT pelvis suggested phlegmon and other extensive chronic dystrophic calcifications of skin and subcutaneous soft tissue along the gluteal fold, both posterior proximal thighs.  Radiology recommends right thigh CT for better visualization -CT right femur without contrast ordered. -Pain control  Uncontrolled hypertension -PTA coreg  25mg  BID, irbesartan  300mg  daily, and amlodipine  10mg  daily -Continue IV Lasix  -Dialysis with ultrafiltration  Physical deconditioning -PT/OT eval  Elevated D-dimer: Unreliable in the setting of ESRD.  Low suspicion for VTE.  Elevated troponin: Likely demand ischemia and delayed clearance  Hyperkalemia: Resolved  History of SLE: Not on meds.  Body mass index is 25.62 kg/m.          DVT prophylaxis:  heparin  injection 5,000 Units Start: 01/11/24 0930  Code Status: Full code Family Communication: None at bedside Level of care: Med-Surg Status is: Inpatient Remains inpatient appropriate because: Respiratory distress/volume overload   Final disposition: Home   55 minutes with more than 50% spent in reviewing records, counseling patient/family and coordinating care.   Sch Meds:  Scheduled Meds:  amLODipine   10 mg Oral Daily   calcitRIOL   0.25 mcg Oral Q M,W,F-HD   carvedilol   25 mg Oral BID WC   Chlorhexidine  Gluconate Cloth  6 each Topical Q0600   furosemide   80 mg Intravenous Daily   heparin   5,000 Units Subcutaneous Q8H   irbesartan   300 mg Oral Daily   Continuous Infusions: PRN Meds:.HYDROmorphone  (DILAUDID ) injection  Antimicrobials: Anti-infectives (From  admission, onward)    None        I have personally reviewed the following labs and images: CBC: Recent Labs  Lab 01/11/24 0347  WBC 9.5  NEUTROABS 5.2  HGB 8.3*  HCT 27.0*  MCV 97.8  PLT 217   BMP &GFR Recent Labs  Lab 01/11/24 0347  NA 138  K 5.9*  CL 98  CO2 20*  GLUCOSE 77  BUN 59*  CREATININE 11.10*  CALCIUM  9.7   Estimated Creatinine Clearance: 7.7 mL/min (A) (by C-G formula based on SCr of 11.1 mg/dL (H)). Liver & Pancreas: Recent Labs  Lab 01/11/24 0347  AST 23  ALT 9  ALKPHOS 67  BILITOT 0.9  PROT 7.8  ALBUMIN  3.2*   No results for input(s): "LIPASE", "AMYLASE" in the last 168 hours. No results for input(s): "AMMONIA" in the last 168 hours. Diabetic: No results for input(s): "HGBA1C" in the last 72 hours. No results for input(s): "GLUCAP" in the last 168 hours. Cardiac Enzymes: No results for input(s): "CKTOTAL", "CKMB", "CKMBINDEX", "TROPONINI" in the last 168 hours. No results for input(s): "PROBNP" in the last 8760 hours. Coagulation Profile: No results for input(s): "INR", "PROTIME" in the last 168 hours. Thyroid Function Tests: No results for input(s): "TSH", "T4TOTAL", "FREET4", "T3FREE", "THYROIDAB" in the last 72 hours. Lipid Profile: No results for input(s): "CHOL", "HDL", "LDLCALC", "TRIG", "CHOLHDL", "LDLDIRECT" in the last 72 hours. Anemia Panel: No results for input(s): "VITAMINB12", "FOLATE", "FERRITIN", "TIBC", "IRON ", "RETICCTPCT" in the last 72 hours. Urine analysis:    Component Value Date/Time   COLORURINE STRAW (A) 11/15/2023 1835   APPEARANCEUR CLEAR 11/15/2023 1835   LABSPEC 1.006 11/15/2023 1835   PHURINE 8.0 11/15/2023 1835   GLUCOSEU 50 (A) 11/15/2023 1835   HGBUR NEGATIVE 11/15/2023 1835   BILIRUBINUR NEGATIVE 11/15/2023 1835   BILIRUBINUR Negative 02/10/2018 1602   KETONESUR NEGATIVE 11/15/2023 1835   PROTEINUR 100 (A) 11/15/2023 1835   UROBILINOGEN 0.2 02/10/2018 1602   UROBILINOGEN 1.0 03/30/2015 2328    NITRITE NEGATIVE 11/15/2023 1835   LEUKOCYTESUR NEGATIVE 11/15/2023 1835   Sepsis Labs: Invalid input(s): "PROCALCITONIN", "LACTICIDVEN"  Microbiology: Recent Results (from the past 240 hours)  Resp panel by RT-PCR (RSV, Flu A&B, Covid) Anterior Nasal Swab     Status: None   Collection Time: 01/11/24  3:55 AM   Specimen: Anterior Nasal Swab  Result Value Ref Range Status   SARS Coronavirus 2 by RT PCR NEGATIVE NEGATIVE Final   Influenza A by PCR NEGATIVE NEGATIVE Final   Influenza B by PCR NEGATIVE NEGATIVE Final    Comment: (NOTE) The Xpert Xpress SARS-CoV-2/FLU/RSV plus assay is intended as an aid in the diagnosis of influenza from Nasopharyngeal swab specimens and should not be used as a sole basis for treatment. Nasal washings and aspirates are unacceptable for Xpert Xpress SARS-CoV-2/FLU/RSV testing.  Fact Sheet for Patients: BloggerCourse.com  Fact Sheet for Healthcare Providers: SeriousBroker.it  This test is not yet approved or cleared by the [place]  FDA and has been authorized for detection and/or diagnosis of SARS-CoV-2 by FDA under an Emergency Use Authorization (EUA). This EUA will remain in effect (meaning this  test can be used) for the duration of the COVID-19 declaration under Section 564(b)(1) of the Act, 21 U.S.C. section 360bbb-3(b)(1), unless the authorization is terminated or revoked.     Resp Syncytial Virus by PCR NEGATIVE NEGATIVE Final    Comment: (NOTE) Fact Sheet for Patients: BloggerCourse.com  Fact Sheet for Healthcare Providers: SeriousBroker.it  This test is not yet approved or cleared by the [place]  FDA and has been authorized for detection and/or diagnosis of SARS-CoV-2 by FDA under an Emergency Use Authorization (EUA). This EUA will remain in effect (meaning this test can be used) for the duration of the COVID-19 declaration  under Section 564(b)(1) of the Act, 21 U.S.C. section 360bbb-3(b)(1), unless the authorization is terminated or revoked.  Performed at [facility], [address], [place], [place] [address]     Radiology Studies: No results found.    Linda [name] T. Nalda Shackleford [facility]  Hospitalist  If 7PM-7AM, please contact night-coverage [email or web address] 01/12/2024, 3:28 PM

## 2024-01-12 NOTE — Plan of Care (Signed)

## 2024-01-12 NOTE — TOC Initial Note (Signed)
 Transition of Care [facility]) - Initial/Assessment Note    Patient Details  Name: Linda [name] MRN: [id number] Date of Birth: Feb 04, 1996  [facility]) CM/SW Contact:    Juliane Och, LCSW Phone Number: 01/12/2024, 10:06 AM  Clinical Narrative:                  10:06 AM CSW introduced self and role to patient at patient's bedside. Patient confirmed she resides with grandmother, aunt, uncle, and two adult cousins. Patient stated that family/friends could not provide transportation or home support if needed upon discharge. Patient denied HH/DME/SNF history. CSW followed up on SDOH needs (transportation, housing, utilities) and offered resources. Patient declined transportation resources and stated that she drives self to medical/non-medical appointments with grandmother's car. CSW informed patient of discharge transportation options (bus passes/taxi voucher) as well as how to obtain them (ask bedside RN). Patient expressed understanding of the information. Patient confirmed housing and utility needs and accepted CSW offer of resources. CSW provided patient with resources, as well as informed on payor transportation benefits.  Expected Discharge Plan: Home/Self Care Barriers to Discharge: Continued Medical Work up   Patient Goals and CMS Choice Patient states their goals for this hospitalization and ongoing recovery are:: to return home          Expected Discharge Plan and Services       Living arrangements for the past 2 months: Single Family Home                                      Prior Living Arrangements/Services Living arrangements for the past 2 months: Single Family Home Lives with:: Relatives Patient language and need for interpreter reviewed:: Yes Do you feel safe going back to the place where you live?: Yes      Need for Family Participation in Patient Care: No (Comment) Care giver support system in place?: No (comment)   Criminal  Activity/Legal Involvement Pertinent to Current Situation/Hospitalization: No - Comment as needed  Activities of Daily Living   ADL Screening (condition at time of admission) Independently performs ADLs?: Yes (appropriate for developmental age) Is the patient deaf or have difficulty hearing?: No Does the patient have difficulty seeing, even when wearing glasses/contacts?: No Does the patient have difficulty concentrating, remembering, or making decisions?: No  Permission Sought/Granted Permission sought to share information with : Family Supports Permission granted to share information with : No (Contact information on chart)  Share Information with NAME: Roya Kaschak     Permission granted to share info w Relationship: Grandmother  Permission granted to share info w Contact Information: [phone]  Emotional Assessment Appearance:: Appears stated age Attitude/Demeanor/Rapport: Engaged Affect (typically observed): Stable, Calm Orientation: : Oriented to Self, Oriented to Place, Oriented to  Time, Oriented to Situation Alcohol / Substance Use: Not Applicable Psych Involvement: No (comment)  Admission diagnosis:  Shortness of breath [R06.02] Hyperkalemia [E87.5] AKI (acute kidney injury) (HCC) [N17.9] Acute on chronic congestive heart failure, unspecified heart failure type [facility]) [I50.9] Patient Active Problem List   Diagnosis Date Noted   Abscess of buttock, right 01/11/2024   Hyperkalemia 12/22/2023   Positive D dimer 12/22/2023   Skin lesions 11/16/2023   Chronic health problem 11/14/2023   Abdominal pain 11/14/2023   Dyspnea 11/14/2023   SOB (shortness of breath) 10/24/2023   Pericardial effusion 10/24/2023   Lupus nephritis (HCC) 10/24/2023   ESRD (end  stage renal disease) on dialysis (HCC) 10/24/2023   Chronic heart failure with preserved ejection fraction (HCC) 10/24/2023   Acute hypoxic respiratory failure (HCC) 10/10/2023   CAP (community acquired pneumonia)  10/10/2023   Acute on chronic heart failure with mildly reduced ejection fraction (HFmrEF, 41-49%) (HCC) 10/10/2023   History of anemia due to chronic kidney disease 10/10/2023   Pulmonary edema 09/11/2023   Shortness of breath 09/11/2023   Pain in multiple muscles 09/11/2023   Nausea vomiting and diarrhea 09/01/2023   ESRD (end stage renal disease) (HCC) 09/30/2021   Acute pulmonary edema (HCC) 09/30/2021   GERD without esophagitis 09/30/2021   Volume overload 09/02/2021   High anion gap metabolic acidosis 09/02/2021   Macrocytic anemia 09/02/2021   Elevated troponin 09/02/2021   Anxiety 11/03/2019   Strain of sternocleidomastoid muscle 11/03/2019   Difficulty with speech 11/03/2019   Prolonged QT interval 03/02/2019   Chronic heart failure with preserved ejection fraction (HFpEF) (HCC) 03/02/2019   ESRD on dialysis (HCC) 01/10/2019   Calciphylaxis 12/27/2018   Tobacco abuse 03/02/2018   Severe sepsis (HCC) 01/06/2018   Angioedema 01/02/2018   Tachycardia 01/02/2018   Chronic kidney disease, stage 5 (HCC)    Hypertensive urgency 11/29/2017   Grief reaction 08/19/2017   Right knee pain    Bilateral knee swelling 04/20/2017   Systemic lupus erythematosus (HCC)    Essential hypertension 12/11/2016   Mixed connective tissue disease (HCC) 02/11/2016   Implanon  in place 01/19/2014   PCP:  Kline, Chianne, PA-C Pharmacy:   [id number] - [place], [place] - [address] [place] [address] Phone: [phone] Fax: [phone]  [id number] - [place], [place] - [address] [address] Phone: [phone] Fax: [phone]  [facility] [address] [place] [address] Phone: [phone] Fax: [phone]     Social Drivers of Health (SDOH) Social History: SDOH Screenings   Food Insecurity: No Food Insecurity (01/11/2024)  Recent Concern: Food Insecurity - Food Insecurity  Present (11/25/2023)  Housing: High Risk (01/11/2024)  Transportation Needs: Unmet Transportation Needs (01/11/2024)  Utilities: At Risk (01/11/2024)  Depression (PHQ2-9): Low Risk  (02/23/2020)  Financial Resource Strain: Low Risk  (09/10/2023)   [facility]  Social Connections: Unknown (01/11/2024)  Tobacco Use: High Risk (01/11/2024)   SDOH Interventions: Housing Interventions: [employer] Provided, Inpatient TOC Transportation Interventions: Payor Benefit, Patient Resources (Friends/Family) Utilities Interventions: Community Resources Provided, Inpatient TOC   Readmission Risk Interventions    11/15/2023    2:53 PM 10/25/2023    3:32 PM 10/02/2021    9:11 AM  Readmission Risk Prevention Plan  Transportation Screening Complete Complete   PCP or Specialist Appt within 3-5 Days   Complete  HRI or Home Care Consult  Complete Complete  Social Work Consult for Recovery Care Planning/Counseling  Complete   Palliative Care Screening  Complete   Medication Review Oceanographer) Referral to Pharmacy Referral to Pharmacy   PCP or Specialist appointment within 3-5 days of discharge Complete    HRI or Home Care Consult Complete    SW Recovery Care/Counseling Consult Complete    Palliative Care Screening Not Applicable    Skilled Nursing Facility Not Applicable

## 2024-01-12 NOTE — Progress Notes (Signed)
  [facility] Progress Note  Subjective:  Seen in room HD early this am as rollover from yest Got 3.0 L off Feeling much better   Vitals:   01/12/24 0636 01/12/24 0651 01/12/24 0701 01/12/24 0706  BP: (!) 153/108 (!) 142/108 (!) 157/111 (!) 149/111  Pulse: 82 87 91 90  Resp: 18 (!) 21 (!) 25 (!) 21  Temp:   98.1 F (36.7 C)   TempSrc:   Oral   SpO2: 99% 99% 100% 100%  Weight:      Height:        Exam: Gen alert, no distress, [place] O2 Looks more comfortable today  No jvd or bruits Chest clear bilat to bases RRR no MRG Abd soft ntnd no mass or ascites +bs Ext no LE edema Neuro is alert, Ox 3 , nf     AVG +bruit        Renal-related home meds: Norvasc  10 daily Coreg  25 twice daily Lasix  80 mg 2 pills daily Irbesartan  300 MGs daily    OP HD: AF MWF  3.5h   B350   71.5kg  AVG   heparin  none       Assessment/ Plan: SOB: suspected vol overload, early IS edema on CXR. Stable on [place] O2. She had HD early am today w/ 3 L UF. Looks more comfortable today. Wean down O2. Buttock/ UE masses: this is NOT calciphylaxis. Gout, hidradenitis, others, not sure. She has SLE. Will d/w pmd.  ESRD: on HD MWF. Had HD today. Next HD Friday.  HTN: bps still a bit high, cont meds  Volume: as above Anemia of esrd: Hb 8-9, follow Secondary hyperparathyroidism: Ca in range, add phos   Rob Zana Hesselbach MD  [facility] 01/12/2024, 3:29 PM  Recent Labs  Lab 01/11/24 0347  HGB 8.3*  ALBUMIN  3.2*  CALCIUM  9.7  CREATININE 11.10*  K 5.9*   No results for input(s): "IRON ", "TIBC", "FERRITIN" in the last 168 hours. Inpatient medications:  amLODipine   10 mg Oral Daily   calcitRIOL   0.25 mcg Oral Q M,W,F-HD   carvedilol   25 mg Oral BID WC   Chlorhexidine  Gluconate Cloth  6 each Topical Q0600   furosemide   80 mg Intravenous Daily   heparin   5,000 Units Subcutaneous Q8H   irbesartan   300 mg Oral Daily    HYDROmorphone  (DILAUDID ) injection

## 2024-01-13 DIAGNOSIS — N179 Acute kidney failure, unspecified: Secondary | ICD-10-CM | POA: Diagnosis not present

## 2024-01-13 DIAGNOSIS — N186 End stage renal disease: Secondary | ICD-10-CM | POA: Diagnosis not present

## 2024-01-13 DIAGNOSIS — E8770 Fluid overload, unspecified: Secondary | ICD-10-CM | POA: Diagnosis not present

## 2024-01-13 LAB — RENAL FUNCTION PANEL
Albumin: 3 g/dL — ABNORMAL LOW (ref 3.5–5.0)
Anion gap: 17 — ABNORMAL HIGH (ref 5–15)
BUN: 43 mg/dL — ABNORMAL HIGH (ref 6–20)
CO2: 25 mmol/L (ref 22–32)
Calcium: 9.7 mg/dL (ref 8.9–10.3)
Chloride: 93 mmol/L — ABNORMAL LOW (ref 98–111)
Creatinine, Ser: 8.97 mg/dL — ABNORMAL HIGH (ref 0.44–1.00)
GFR, Estimated: 6 mL/min — ABNORMAL LOW (ref >60)
Glucose, Bld: 107 mg/dL — ABNORMAL HIGH (ref 70–99)
Phosphorus: 8.3 mg/dL — ABNORMAL HIGH (ref 2.5–4.6)
Potassium: 4.6 mmol/L (ref 3.5–5.1)
Sodium: 135 mmol/L (ref 135–145)

## 2024-01-13 LAB — CBC
HCT: 28.7 % — ABNORMAL LOW (ref 36.0–46.0)
Hemoglobin: 9 g/dL — ABNORMAL LOW (ref 12.0–15.0)
MCH: 29.7 pg (ref 26.0–34.0)
MCHC: 31.4 g/dL (ref 30.0–36.0)
MCV: 94.7 fL (ref 80.0–100.0)
Platelets: 211 10*3/uL (ref 150–400)
RBC: 3.03 MIL/uL — ABNORMAL LOW (ref 3.87–5.11)
RDW: 20.3 % — ABNORMAL HIGH (ref 11.5–15.5)
WBC: 6.9 10*3/uL (ref 4.0–10.5)
nRBC: 0 % (ref 0.0–0.2)

## 2024-01-13 LAB — MAGNESIUM: Magnesium: 2.2 mg/dL (ref 1.7–2.4)

## 2024-01-13 MED ORDER — HYDROMORPHONE HCL 1 MG/ML IJ SOLN
1.0000 mg | INTRAMUSCULAR | Status: DC | PRN
Start: 2024-01-13 — End: 2024-01-13
  Administered 2024-01-13: 1 mg via INTRAVENOUS
  Filled 2024-01-13: qty 1

## 2024-01-13 MED ORDER — HYDROMORPHONE HCL 1 MG/ML IJ SOLN
0.5000 mg | INTRAMUSCULAR | Status: DC | PRN
  Administered 2024-01-13 – 2024-01-14 (×6): 0.5 mg via INTRAVENOUS
  Filled 2024-01-13 (×5): qty 0.5

## 2024-01-13 MED ORDER — CHLORHEXIDINE GLUCONATE CLOTH 2 % EX PADS
6.0000 | MEDICATED_PAD | Freq: Every day | CUTANEOUS | Status: DC
Start: 2024-01-14 — End: 2024-01-14
  Administered 2024-01-14: 6 via TOPICAL

## 2024-01-13 MED ORDER — SIMETHICONE 80 MG PO CHEW
80.0000 mg | CHEWABLE_TABLET | Freq: Four times a day (QID) | ORAL | Status: DC
  Administered 2024-01-13 – 2024-01-14 (×5): 80 mg via ORAL
  Filled 2024-01-13 (×6): qty 1

## 2024-01-13 MED ORDER — SENNOSIDES-DOCUSATE SODIUM 8.6-50 MG PO TABS
2.0000 | ORAL_TABLET | Freq: Every day | ORAL | Status: DC
  Administered 2024-01-13: 2 via ORAL
  Filled 2024-01-13 (×2): qty 2

## 2024-01-13 MED ORDER — LABETALOL HCL 5 MG/ML IV SOLN: 10.0000 mg | INTRAVENOUS | Status: DC | PRN

## 2024-01-13 MED ORDER — ISOSORBIDE MONONITRATE ER 30 MG PO TB24
30.0000 mg | ORAL_TABLET | Freq: Every day | ORAL | Status: DC
  Administered 2024-01-13 – 2024-01-14 (×2): 30 mg via ORAL
  Filled 2024-01-13 (×2): qty 1

## 2024-01-13 NOTE — Evaluation (Signed)
 Physical Therapy Evaluation and Discharge Patient Details Name: Linda [name] MRN: [id number] DOB: December 07, 1995 Today's Date: 01/13/2024  History of Present Illness  Linda [name] is a 28 y.o. female  who presented 01/11/24 due to  SOB.  Pt admitted due to ESRD. CT of pelvis ordered and surgery consulted for R buttock mass. PMHx includes HTN, lupus, LVH, migraines, ESRD HW MWF, GERD, septic prepatellar bursitis of R knee  Clinical Impression   Patient evaluated by Physical Therapy with no further acute PT needs identified. Able to ambulate in hallway without device (refused rollator as recommended by OT) with sats 99% on RA.  PT is signing off. Thank you for this referral.         If plan is discharge home, recommend the following:     Can travel by private vehicle        Equipment Recommendations None recommended by PT  Recommendations for Other Services       Functional Status Assessment Patient has not had a recent decline in their functional status     Precautions / Restrictions Precautions Precautions: Fall Recall of Precautions/Restrictions: Intact Restrictions Weight Bearing Restrictions Per Provider Order: No      Mobility  Bed Mobility Overal bed mobility: Independent                  Transfers Overall transfer level: Independent                      Ambulation/Gait Ambulation/Gait assistance: Independent Gait Distance (Feet): 110 Feet Assistive device: None Gait Pattern/deviations: WFL(Within Functional Limits) Gait velocity: decr Gait velocity interpretation: 1.31 - 2.62 ft/sec, indicative of limited community Medical sales representative     Tilt Bed    Modified Rankin (Stroke Patients Only)       Balance Overall balance assessment: Needs assistance Sitting-balance support: Feet supported Sitting balance-Leahy Scale: Good     Standing balance support: No upper extremity  supported Standing balance-Leahy Scale: Fair                               Pertinent Vitals/Pain Pain Assessment Pain Assessment: Faces Faces Pain Scale: Hurts little more Pain Location: R buttocks Pain Descriptors / Indicators: Discomfort Pain Intervention(s): Limited activity within patient's tolerance    Home Living Family/patient expects to be discharged to:: Private residence Living Arrangements: Spouse/significant other Available Help at Discharge: Available 24 hours/day (grandmother) Type of Home: House Home Access: Stairs to enter Entrance Stairs-Rails: Doctor, general practice of Steps: 5   Home Layout: One level Home Equipment: Shower seat - built in;Grab bars - tub/shower;Hand held Programmer, systems (2 wheels) Additional Comments: Recently moved in with grandmother where aunt and uncle also resides    Prior Function Prior Level of Function : Independent/Modified Independent;Driving             Mobility Comments: did not use DME for functional mobility. Reports family has one car that they all use for appointments which makes it difficult for her to make all of her appointments ADLs Comments: completed all ADLs/IADLs independently     Extremity/Trunk Assessment   Upper Extremity Assessment Upper Extremity Assessment: Overall WFL for tasks assessed    Lower Extremity Assessment Lower Extremity Assessment: Overall WFL for tasks assessed    Cervical / Trunk Assessment Cervical /  Trunk Assessment: Normal  Communication   Communication Communication: No apparent difficulties    Cognition Arousal: Alert Behavior During Therapy: WFL for tasks assessed/performed                             Following commands: Intact       Cueing Cueing Techniques: Verbal cues     General Comments General comments (skin integrity, edema, etc.): on 3L on arrival (see previous OT note); pt removed oxygen and got OOB to put her  sweatpants on). Unable to get a good pleth or reading on pulse ox with pt denying SOB. Ambulated on RA without dyspnea and able to speak while walking. On return to room sats 99% on RA. RN made aware and pt left on RA    Exercises     Assessment/Plan    PT Assessment Patient does not need any further PT services  PT Problem List         PT Treatment Interventions      PT Goals (Current goals can be found in the Care Plan section)  Acute Rehab PT Goals Patient Stated Goal: NA PT Goal Formulation: All assessment and education complete, DC therapy    Frequency       Co-evaluation               AM-PAC PT "6 Clicks" Mobility  Outcome Measure Help needed turning from your back to your side while in a flat bed without using bedrails?: None Help needed moving from lying on your back to sitting on the side of a flat bed without using bedrails?: None Help needed moving to and from a bed to a chair (including a wheelchair)?: None Help needed standing up from a chair using your arms (e.g., wheelchair or bedside chair)?: None Help needed to walk in hospital room?: None Help needed climbing 3-5 steps with a railing? : None 6 Click Score: 24    End of Session   Activity Tolerance: Patient tolerated treatment well Patient left: in bed;with call bell/phone within reach Nurse Communication: Mobility status;Other (comment) (sats 99% on RA) PT Visit Diagnosis: Difficulty in walking, not elsewhere classified (R26.2)    Time: 9326-7124 PT Time Calculation (min) (ACUTE ONLY): 22 min   Charges:   PT Evaluation $PT Eval Low Complexity: 1 Low   PT General Charges $$ ACUTE PT VISIT: 1 Visit          Gayle Kava, PT Acute Rehabilitation Services  Office [phone]   Guilford Leep 01/13/2024, 10:58 AM

## 2024-01-13 NOTE — Progress Notes (Signed)
 [facility] night cross cover note:   I was notified by the patient's RN of the patient's request for increase in the dose of her prn IV Dilaudid .  I subsequently increased her Dilaudid  from 0.5 mg IV every 4 hours as needed to 1.0 mg IV every 4 hours as needed.     Camelia Cavalier, DO Hospitalist

## 2024-01-13 NOTE — Plan of Care (Signed)
 Pt resting in bed, with eyes closed. Hob elevated. Skin warm and dry. No complaints voiced. No resp distress noted. Sr x3 elevated. Bed in low position. Call light in reach.

## 2024-01-13 NOTE — Progress Notes (Signed)
 PROGRESS NOTE  GURSEERAT STIVES MWU:[id number] DOB: 08/24/1996   PCP: Kline, Chianne, PA-C  Patient is from: Home.  Lives with grandmother.  DOA: 01/11/2024 LOS: 1  Chief complaints Chief Complaint  Patient presents with   Shortness of Breath     Brief Narrative / Interim history: 28 year old F with PMH of ESRD on HD MWF, SLE, HFmrEF, HTN and anemia presenting with shortness of breath and chronic right buttock pain likely due to chronic right buttock mass from possible calciphylaxis.  Patient reports full HD session on Monday. In ED, elevated BP to 160/120.  100% on room air. BNP > 4500.  K5.9.  Hgb 8.3 (about baseline).  No leukocytosis.  Nephrology consulted.  General surgery consulted for right buttock mass/pain.  CT pelvis ordered.  CT pelvis showed partially visible phlegmon like area of posteromedial proximal right thigh, and CT right thigh was recommended.  CT right thigh showed clusters of dystrophic calcifications in the inferior right buttock and proximal posterior thigh with mildly increased inflammatory stranding compared to CT on 3/16.  Surgery recommended outpatient follow-up with dermatology and her rheumatologist.  Subjective: Seen and examined earlier this morning.  Continues to endorse significant pain in her right buttock/right proximal thigh from the mass.  Also reports some shortness of breath and abdominal distention.  Passing gas and having bowel movements.  Blood pressure elevated.  Objective: Vitals:   01/12/24 0706 01/12/24 1942 01/13/24 0414 01/13/24 0823  BP: (!) 149/111 (!) 134/100 (!) 150/117 (!) 170/121  Pulse: 90 72 83 73  Resp: (!) 21 16 16    Temp:  98 F (36.7 C) 98.2 F (36.8 C) 98 F (36.7 C)  TempSrc:      SpO2: 100% 100% 100% 100%  Weight:      Height:        Examination:  GENERAL: No apparent distress.  Nontoxic. HEENT: MMM.  Vision and hearing grossly intact.  NECK: Supple.  No apparent JVD.  RESP:  No IWOB.  Fair aeration  bilaterally. CVS:  RRR. Heart sounds normal.  ABD/GI/GU: BS+. Abd soft, NTND.  MSK/EXT:  Moves extremities. No apparent deformity. No edema.  SKIN: Palpable mass over proximal right thigh posteriorly.  Tender to palpation.  No fluctuance. NEURO: Awake, alert and oriented appropriately.  No apparent focal neuro deficit. PSYCH: Calm. Normal affect.   Consultants:  Nephrology General Surgery  Procedures: None  Microbiology summarized: COVID-19, influenza and RSV PCR nonreactive  Assessment and plan: Respiratory distress due to volume overload in patient with ESRD: Improved after dialysis. -S/p HD with ultrafiltration of 3 L -Continue IV Lasix  -Wean oxygen as able. -IS   Right posterior thigh/buttock mass: This is chronic and present for over a year.  She has other small lesions on the arms as well concerned about calciphylaxis.  CT shows clusters of dystrophic calcifications in the inferior right buttock and proximal posterior thigh with mildly increased inflammatory stranding without abscess.  -Surgery recommends outpatient follow-up. -Patient is advised to reschedule her appointments with dermatology and rheumatology -Pain control  Uncontrolled hypertension: BP elevated to 170/121 this morning. - Continue home Coreg , irbesartan  and amlodipine  - Add Imdur - IV labetalol  as needed  - Continue IV Lasix  - Ultrafiltration with dialysis  Abdominal distention: Passing gas and has bowel movements as well -Advised to be cautious with opiate - Encouraged ambulation  -Add scheduled Senokot - Gas-X  Physical deconditioning -PT/OT eval  Elevated D-dimer: Unreliable in the setting of ESRD.  Low suspicion for VTE.  Elevated troponin: Likely demand ischemia and delayed clearance  Hyperkalemia: Resolved  History of SLE: Not on meds.  Outpatient follow-up with [facility]ology at [facility]  Body mass index is 25.62 kg/m.          DVT prophylaxis:  heparin  injection 5,000 Units  Start: 01/11/24 0930  Code Status: Full code Family Communication: None at bedside Level of care: Med-Surg Status is: Inpatient Remains inpatient appropriate because: Respiratory distress/volume overload   Final disposition: Home   55 minutes with more than 50% spent in reviewing records, counseling patient/family and coordinating care.   Sch Meds:  Scheduled Meds:  amLODipine   10 mg Oral Daily   calcitRIOL   0.25 mcg Oral Q M,W,F-HD   carvedilol   25 mg Oral BID WC   Chlorhexidine  Gluconate Cloth  6 each Topical Q0600   furosemide   80 mg Intravenous Daily   heparin   5,000 Units Subcutaneous Q8H   irbesartan   300 mg Oral Daily   isosorbide mononitrate  30 mg Oral Daily   senna-docusate  2 tablet Oral Daily   simethicone   80 mg Oral QID   Continuous Infusions: PRN Meds:.HYDROmorphone  (DILAUDID ) injection, labetalol   Antimicrobials: Anti-infectives (From admission, onward)    None        I have personally reviewed the following labs and images: CBC: Recent Labs  Lab 01/11/24 0347 01/13/24 0705  WBC 9.5 6.9  NEUTROABS 5.2  --   HGB 8.3* 9.0*  HCT 27.0* 28.7*  MCV 97.8 94.7  PLT 217 211   BMP &GFR Recent Labs  Lab 01/11/24 0347 01/13/24 0705  NA 138 135  K 5.9* 4.6  CL 98 93*  CO2 20* 25  GLUCOSE 77 107*  BUN 59* 43*  CREATININE 11.10* 8.97*  CALCIUM  9.7 9.7  MG  --  2.2  PHOS  --  8.3*   Estimated Creatinine Clearance: 9.5 mL/min (A) (by C-G formula based on SCr of 8.97 mg/dL (H)). Liver & Pancreas: Recent Labs  Lab 01/11/24 0347 01/13/24 0705  AST 23  --   ALT 9  --   ALKPHOS 67  --   BILITOT 0.9  --   PROT 7.8  --   ALBUMIN  3.2* 3.0*   No results for input(s): "LIPASE", "AMYLASE" in the last 168 hours. No results for input(s): "AMMONIA" in the last 168 hours. Diabetic: No results for input(s): "HGBA1C" in the last 72 hours. No results for input(s): "GLUCAP" in the last 168 hours. Cardiac Enzymes: No results for input(s): "CKTOTAL",  "CKMB", "CKMBINDEX", "TROPONINI" in the last 168 hours. No results for input(s): "PROBNP" in the last 8760 hours. Coagulation Profile: No results for input(s): "INR", "PROTIME" in the last 168 hours. Thyroid Function Tests: No results for input(s): "TSH", "T4TOTAL", "FREET4", "T3FREE", "THYROIDAB" in the last 72 hours. Lipid Profile: No results for input(s): "CHOL", "HDL", "LDLCALC", "TRIG", "CHOLHDL", "LDLDIRECT" in the last 72 hours. Anemia Panel: No results for input(s): "VITAMINB12", "FOLATE", "FERRITIN", "TIBC", "IRON ", "RETICCTPCT" in the last 72 hours. Urine analysis:    Component Value Date/Time   COLORURINE STRAW (A) 11/15/2023 1835   APPEARANCEUR CLEAR 11/15/2023 1835   LABSPEC 1.006 11/15/2023 1835   PHURINE 8.0 11/15/2023 1835   GLUCOSEU 50 (A) 11/15/2023 1835   HGBUR NEGATIVE 11/15/2023 1835   BILIRUBINUR NEGATIVE 11/15/2023 1835   BILIRUBINUR Negative 02/10/2018 1602   KETONESUR NEGATIVE 11/15/2023 1835   PROTEINUR 100 (A) 11/15/2023 1835   UROBILINOGEN 0.2 02/10/2018 1602   UROBILINOGEN 1.0 03/30/2015 2328   NITRITE NEGATIVE 11/15/2023  1835   LEUKOCYTESUR NEGATIVE 11/15/2023 1835   Sepsis Labs: Invalid input(s): "PROCALCITONIN", "LACTICIDVEN"  Microbiology: Recent Results (from the past 240 hours)  Resp panel by RT-PCR (RSV, Flu A&B, Covid) Anterior Nasal Swab     Status: None   Collection Time: 01/11/24  3:55 AM   Specimen: Anterior Nasal Swab  Result Value Ref Range Status   SARS Coronavirus 2 by RT PCR NEGATIVE NEGATIVE Final   Influenza A by PCR NEGATIVE NEGATIVE Final   Influenza B by PCR NEGATIVE NEGATIVE Final    Comment: (NOTE) The Xpert Xpress SARS-CoV-2/FLU/RSV plus assay is intended as an aid in the diagnosis of influenza from Nasopharyngeal swab specimens and should not be used as a sole basis for treatment. Nasal washings and aspirates are unacceptable for Xpert Xpress SARS-CoV-2/FLU/RSV testing.  Fact Sheet for  Patients: BloggerCourse.com  Fact Sheet for Healthcare Providers: SeriousBroker.it  This test is not yet approved or cleared by the [place]  FDA and has been authorized for detection and/or diagnosis of SARS-CoV-2 by FDA under an Emergency Use Authorization (EUA). This EUA will remain in effect (meaning this test can be used) for the duration of the COVID-19 declaration under Section 564(b)(1) of the Act, 21 U.S.C. section 360bbb-3(b)(1), unless the authorization is terminated or revoked.     Resp Syncytial Virus by PCR NEGATIVE NEGATIVE Final    Comment: (NOTE) Fact Sheet for Patients: BloggerCourse.com  Fact Sheet for Healthcare Providers: SeriousBroker.it  This test is not yet approved or cleared by the [place]  FDA and has been authorized for detection and/or diagnosis of SARS-CoV-2 by FDA under an Emergency Use Authorization (EUA). This EUA will remain in effect (meaning this test can be used) for the duration of the COVID-19 declaration under Section 564(b)(1) of the Act, 21 U.S.C. section 360bbb-3(b)(1), unless the authorization is terminated or revoked.  Performed at [facility], [address] [place], [place] [address]     Radiology Studies: CT FEMUR RIGHT WO CONTRAST Result Date: 01/12/2024 CLINICAL DATA:  Soft tissue infection suspected, thigh. History of end-stage renal disease, lupus, and mixed connective tissue disease. EXAM: CT OF THE LOWER RIGHT EXTREMITY WITHOUT CONTRAST TECHNIQUE: Multidetector CT imaging of the right lower extremity was performed according to the standard protocol. RADIATION DOSE REDUCTION: This exam was performed according to the departmental dose-optimization program which includes automated exposure control, adjustment of the mA and/or kV according to patient size and/or use of iterative reconstruction technique. COMPARISON:   CT pelvis 01/11/2024, CT abdomen and pelvis 11/14/2023 FINDINGS: Bones/Joint/Cartilage Increased bone density is seen diffusely, again compatible with the patient's history of end-stage renal disease and chronic renal osteodystrophy. No cortical erosion. No acute fracture. Ligaments Suboptimally assessed by CT. Muscles and Tendons Normal size and density of the regional musculature. No tendon tear is seen. Soft tissues There are again numerous dystrophic calcifications seen within the midline posterior buttocks, the bilateral more inferior buttocks, as on prior CT. Additional mild just over calcifications within the posterolateral proximal right thigh (axial series 4, images 141 through 150). There are again extensive calcification seen throughout the distal anterolateral greater than anteromedial thigh and anterior knee subcutaneous fat and more mildly within the posterolateral distal thigh. The left thigh is partially visualized and similar, symmetric calcifications are seen anterior to the left knee and minimally at the posterior medial aspect of the knee. Compared to 11/14/2023, there is mildly increased inflammatory stranding at the deep aspect of the inferior right buttock and proximal posterior thigh  dystrophic calcifications (axial series 4 images 80 through 123). However, no walled-off abscess is seen. IMPRESSION: 1. Extensive dystrophic calcifications are again seen within the midline posterior buttocks, bilateral more inferior buttocks, and throughout the distal anterolateral greater than anteromedial thigh and anterior knee subcutaneous fat. These are chronic and again likely secondary to the patient's history of chronic renal failure (calcinosis or chronic renal failure) or connective tissue disease. 2. Compared to 11/14/2023, there is mildly increased inflammatory stranding at the deep aspect of the inferior right buttock and proximal posterior thigh cluster of dystrophic calcifications. However, no  walled-off abscess is seen. 3. Increased bone density is seen diffusely, again compatible with the patient's history of end-stage renal disease and chronic renal osteodystrophy. Electronically Signed   By: Bertina Broccoli M.D.   On: 01/12/2024 21:18      Wah Sabic T[facility]  Hospitalist  If 7PM-7AM, please contact night-coverage [email or web address] 01/13/2024, 12:16 PM

## 2024-01-13 NOTE — Progress Notes (Signed)
  [facility] Progress Note  Subjective:  Seen in room Pt upset about her skin condition, wants the lumps to be "cut out", told her no doctor is going to do that, we need a diagnosis first  Gen surg not going to do biopsy, recommending this is an outpatient procedure Pt made a derm appt after last admit but then missed the appt Pt states also she has a lupus doctor at [facility], sees her about once per year  Vitals:   01/12/24 0706 01/12/24 1942 01/13/24 0414 01/13/24 0823  BP: (!) 149/111 (!) 134/100 (!) 150/117 (!) 170/121  Pulse: 90 72 83 73  Resp: (!) 21 16 16    Temp:  98 F (36.7 C) 98.2 F (36.8 C) 98 F (36.7 C)  TempSrc:      SpO2: 100% 100% 100% 100%  Weight:      Height:        Exam: Gen alert, no distress, [place] O2 Looks comfortable No jvd or bruits Chest clear bilat to bases RRR no MRG Abd soft ntnd no mass or ascites +bs Ext no LE edema Neuro is alert, Ox 3 , nf     AVG +bruit        Renal-related home meds: Norvasc  10 daily Coreg  25 twice daily Lasix  80 mg 2 pills daily Irbesartan  300 MGs daily    OP HD: AF MWF  3.5h   B350   71.5kg  AVG   heparin  none       Assessment/ Plan: SOB: suspected vol overload, early IS edema on CXR. Stable on [place] O2. She had HD yest am w/ 3 L UF. Looks more comfortable today. Has more volume on, will plan HD 1st shift tomorrow w/ max UF.  Buttock/ UE masses: this is NOT calciphylaxis. Gout, hidradenitis, others, not sure. She has SLE. Gen surg will not do biopsy, recommend derm consult as outpt. Pt missed her dermatology appt about 1 month. She also has a lupus specialist at [facility]. I asked her to reschedule the derm appt for biopsy/ treatment,  and also she should see her lupus MD at [facility]. Have d/w pmd.   ESRD: on HD MWF. Had HD early yest am. Next HD Friday.  HTN: bps still a bit high, cont meds  Volume: as above Anemia of esrd: Hb 8-9, follow Secondary hyperparathyroidism: Ca in range, add phos   [name] MD  [facility] 01/13/2024, 12:34 PM  Recent Labs  Lab 01/11/24 0347 01/13/24 0705  HGB 8.3* 9.0*  ALBUMIN  3.2* 3.0*  CALCIUM  9.7 9.7  PHOS  --  8.3*  CREATININE 11.10* 8.97*  K 5.9* 4.6   No results for input(s): "IRON ", "TIBC", "FERRITIN" in the last 168 hours. Inpatient medications:  amLODipine   10 mg Oral Daily   calcitRIOL   0.25 mcg Oral Q M,W,F-HD   carvedilol   25 mg Oral BID WC   Chlorhexidine  Gluconate Cloth  6 each Topical Q0600   furosemide   80 mg Intravenous Daily   heparin   5,000 Units Subcutaneous Q8H   irbesartan   300 mg Oral Daily   isosorbide mononitrate  30 mg Oral Daily   senna-docusate  2 tablet Oral Daily   simethicone   80 mg Oral QID    HYDROmorphone  (DILAUDID ) injection, labetalol 

## 2024-01-13 NOTE — Progress Notes (Signed)
 Occupational Therapy Treatment Patient Details Name: CORLISS CISSON MRN: [id number] DOB: 1996/05/25 Today's Date: 01/13/2024   History of present illness Dreyah L. Carro is a 28 y.o. female  who presented 01/11/24 due to  SOB.  Pt admitted due to ESRD. CT of pelvis ordered and surgery consulted for R buttock mass. PMHx includes HTN, lupus, LVH, migraines, ESRD HW MWF, GERD, septic prepatellar bursitis of R knee   OT comments  Pt reported that at PLOF they live with grandmother in a single level home with 5 steps to enter into the home with a walk in shower. She was driving and using no DME. Pt at this time was able to gather clothing items and complete dressing with sitting to standing with mod I due to buttock pain and SOB. However, when attempting to ambulate not to need close CGA as started to feel unstable and required wide base of support. Pt also noted to have difficulties with SOB but also had difficulties with getting a good pleth on pt. However, when on RA was at 77% and then required to 4L to be able to remain above 90% and then ambulated on 4L and went down to 84% and went up to 4.5L while sitting to be able to come back up to 90%. Nursing was made aware. Acute Occupational Therapy to follow and recommendation for [facility] therapy at dc.        If plan is discharge home, recommend the following:  Assistance with cooking/housework   Equipment Recommendations   (possible need for [phone] (message PT to trial))    Recommendations for Other Services      Precautions / Restrictions Precautions Precautions: Fall Recall of Precautions/Restrictions: Intact Restrictions Weight Bearing Restrictions Per Provider Order: No       Mobility Bed Mobility Overal bed mobility: Independent                  Transfers Overall transfer level: Modified independent                       Balance Overall balance assessment: Needs assistance Sitting-balance support: Feet  supported Sitting balance-Leahy Scale: Good     Standing balance support: Single extremity supported Standing balance-Leahy Scale: Fair Standing balance comment: Pt noted to start to feel unstable when ambulating in halls and needed wide BOS                           ADL either performed or assessed with clinical judgement   ADL Overall ADL's : Needs assistance/impaired Eating/Feeding: Independent;Sitting   Grooming: Wash/dry hands;Standing   Upper Body Bathing: Modified independent;Sitting   Lower Body Bathing: Modified independent;Sit to/from stand;Sitting/lateral leans   Upper Body Dressing : Modified independent;Standing;Sitting   Lower Body Dressing: Modified independent;Sit to/from stand   Toilet Transfer: Modified Independent   Toileting- Clothing Manipulation and Hygiene: Modified independent;Sit to/from stand       Functional mobility during ADLs: Contact guard assist      Extremity/Trunk Assessment Upper Extremity Assessment Upper Extremity Assessment: Overall WFL for tasks assessed   Lower Extremity Assessment Lower Extremity Assessment: Defer to PT evaluation   Cervical / Trunk Assessment Cervical / Trunk Assessment: Normal    Vision Baseline Vision/History: 0 No visual deficits Ability to See in Adequate Light: 0 Adequate Patient Visual Report: No change from baseline Vision Assessment?: No apparent visual deficits   Perception Perception Perception: Within Functional Limits  Praxis Praxis Praxis: [facility]   Communication Communication Communication: No apparent difficulties   Cognition Arousal: Alert Behavior During Therapy: WFL for tasks assessed/performed                                 Following commands: Intact        Cueing   Cueing Techniques: Verbal cues  Exercises      Shoulder Instructions       General Comments      Pertinent Vitals/ Pain       Pain Assessment Pain Assessment: 0-10 Pain Score:  8  Pain Location: R buttocks Pain Descriptors / Indicators: Aching Pain Intervention(s): Limited activity within patient's tolerance, Monitored during session  Home Living Family/patient expects to be discharged to:: Private residence Living Arrangements: Spouse/significant other Available Help at Discharge: Available 24 hours/day (grandmother) Type of Home: House Home Access: Stairs to enter Secretary/administrator of Steps: 5 Entrance Stairs-Rails: Right;Left Home Layout: One level     Bathroom Shower/Tub: Producer, television/film/video: Standard     Home Equipment: Shower seat - built in;Grab bars - tub/shower;Hand held Programmer, systems (2 wheels)          Prior Functioning/Environment              Frequency  Min 1X/week        Progress Toward Goals  OT Goals(current goals can now be found in the care plan section)     Acute Rehab OT Goals Patient Stated Goal: to go home OT Goal Formulation: With patient Time For Goal Achievement: 01/27/24 Potential to Achieve Goals: Good  Plan      Co-evaluation                 AM-PAC OT "6 Clicks" Daily Activity     Outcome Measure   Help from another person eating meals?: None Help from another person taking care of personal grooming?: None Help from another person toileting, which includes using toliet, bedpan, or urinal?: None Help from another person bathing (including washing, rinsing, drying)?: A Little Help from another person to put on and taking off regular upper body clothing?: None Help from another person to put on and taking off regular lower body clothing?: None 6 Click Score: 23    End of Session Equipment Utilized During Treatment: Gait belt  OT Visit Diagnosis: Unsteadiness on feet (R26.81)   Activity Tolerance Patient tolerated treatment well;Other (comment)   Patient Left in bed;with call bell/phone within reach   Nurse Communication Mobility status;Other (comment)  (o2)        Time: 1610-9604 OT Time Calculation (min): 54 min  Charges: OT General Charges $OT Visit: 1 Visit OT Evaluation $OT Eval Low Complexity: 1 Low OT Treatments $Self Care/Home Management : 38-52 mins  Erving Heather OTR/L  Acute Rehab Services  [phone] office number   Stevphen Elders 01/13/2024, 8:34 AM

## 2024-01-14 ENCOUNTER — Telehealth: Payer: Self-pay | Admitting: Nurse Practitioner

## 2024-01-14 DIAGNOSIS — L0231 Cutaneous abscess of buttock: Secondary | ICD-10-CM | POA: Diagnosis not present

## 2024-01-14 DIAGNOSIS — N179 Acute kidney failure, unspecified: Secondary | ICD-10-CM | POA: Diagnosis not present

## 2024-01-14 DIAGNOSIS — N186 End stage renal disease: Secondary | ICD-10-CM | POA: Diagnosis not present

## 2024-01-14 DIAGNOSIS — E8770 Fluid overload, unspecified: Secondary | ICD-10-CM | POA: Diagnosis not present

## 2024-01-14 MED ORDER — SENNOSIDES-DOCUSATE SODIUM 8.6-50 MG PO TABS
1.0000 | ORAL_TABLET | Freq: Two times a day (BID) | ORAL | Status: DC | PRN
Start: 2024-01-14 — End: 2024-02-09

## 2024-01-14 MED ORDER — ISOSORBIDE MONONITRATE ER 30 MG PO TB24: 30.0000 mg | ORAL_TABLET | Freq: Every day | ORAL | 0 refills | Status: DC

## 2024-01-14 MED ORDER — HYDROMORPHONE HCL 1 MG/ML IJ SOLN
INTRAMUSCULAR | Status: AC
  Filled 2024-01-14: qty 0.5

## 2024-01-14 MED ORDER — OXYCODONE HCL 10 MG PO TABS
10.0000 mg | ORAL_TABLET | Freq: Three times a day (TID) | ORAL | 0 refills | Status: AC | PRN
Start: 2024-01-14 — End: 2024-01-21

## 2024-01-14 NOTE — Discharge Planning (Signed)
 [facility] Patient Discharge Orders - [facility] CL[facility]: AF MWF  Patient's name: KELLEE MCKOY Admit/DC Dates: 01/11/2024 - 01/14/2024  DISCHARGE DIAGNOSES: Shortness of breath second to volume overload   Right thigh/buttock pain - to follow up with dermatologist  HD ORDER CHANGES: Heparin  change: no heparin  EDW Change: yes New EDW: 72.5 kg Bath Change: no change  ANEMIA MANAGEMENT: Aranesp : Given: no     ESA dose for discharge: mircera 225 mcg mcg IV q 2 weeks, to start on 01/17/24 IV Iron  dose at discharge: per protocol Transfusion: Given: no  BONE/MINERAL MEDICATIONS: Hectorol /Calcitriol  change: per protocol Sensipar /Parsabiv change: per protocol  ACCESS INTERVENTION/CHANGE: no change Details: AVG   RECENT LABS: Recent Labs  Lab 01/13/24 0705  HGB 9.0*  NA 135  K 4.6  CALCIUM  9.7  PHOS 8.3*  ALBUMIN  3.0*    IV ANTIBIOTICS: no Details:  OTHER ANTICOAGULATION: On Coumadin?: no Last INR: Managed By:  OTHER/APPTS/LAB ORDERS: Please draw updated labs at HD Please follow up with dermatologist Please follow up with rheumatologist for management of Lupus  D/C Meds to be reconciled by nurse after every discharge.  Completed By: Hersey Lorenzo, NP-C   Reviewed by: MD:______ RN_______

## 2024-01-14 NOTE — Discharge Summary (Signed)
 Physician Discharge Summary  VELEDA HARNOIS ZOX:[id number] DOB: [birth year] DOA: 01/11/2024  PCP: Kline, Chianne, PA-C  Admit date: 01/11/2024 Discharge date: 01/14/24  Admitted From: Home Disposition: Home Recommendations for Outpatient Follow-up:  Follow up with PCP in 1 week Encouraged to follow-up with general surgery, dermatology and rheumatology Reassess blood pressure at follow-up Please follow up on the following pending results: None  Home Health: No need identified Equipment/Devices: No need identified  Discharge Condition: Stable CODE STATUS: Full code  Follow-up Information     Kline, Chianne, PA-C. Schedule an appointment as soon as possible for a visit in 1 week(s).   Specialty: Physician Assistant Contact information: [address] [place] [address] [phone]         [facility]. Schedule an appointment as soon as possible for a visit in 1 week(s).   Why: Right thigh mass. Seen by Dr. Belva Boyden in hospital Contact information: [address] [place]   [phone]                Hospital course 28 year old F with PMH of ESRD on HD MWF, SLE, HFmrEF, HTN and anemia presenting with shortness of breath and chronic right buttock pain likely due to chronic right buttock mass from possible calciphylaxis.  Patient reports full HD session on Monday. In ED, elevated BP to 160/120.  100% on room air. BNP > 4500.  K5.9.  Hgb 8.3 (about baseline).  No leukocytosis.  Nephrology consulted.  General surgery consulted for right buttock mass/pain.  CT pelvis ordered.   CT pelvis showed partially visible phlegmon like area of posteromedial proximal right thigh, and CT right thigh was recommended.  CT right thigh showed clusters of dystrophic calcifications in the inferior right buttock and proximal posterior thigh with mildly increased inflammatory stranding compared to CT on 3/16.  Surgery  recommended outpatient follow-up.  Encouraged to reschedule her appointment with dermatology.  Also encouraged to follow-up with rheumatology.  See individual problem list below for more.   Problems addressed during this hospitalization Acute on chronic combined CHF with fluid overload in patient with ESRD: Improved after ultrafiltration with removal of 3 L and 4 L on 5/14 and 5/16.  Liberated off oxygen.  Saturating at 100% on RA. -Continue home Lasix  and antihypertensive meds -Continue outpatient dialysis   ESRD on HD MWF -Dialysis as above.  Right posterior thigh/buttock mass: This is chronic and present for over a year.  She has other small lesions on the arms as well concerned about calciphylaxis.  CT shows clusters of dystrophic calcifications in the inferior right buttock and proximal posterior thigh with mildly increased inflammatory stranding without abscess.  - Outpatient follow-up with surgery, dermatology and rheumatology -Gave Rx for oxycodone  10 mg, #21.  Narcotic database reviewed.   Uncontrolled hypertension: BP improved - Continue home Coreg , irbesartan , Lasix  and amlodipine  - Added Imdur 30 mg daily.  Consider titrating up  Abdominal distention: Passing gas and has bowel movements as well.  Resolved. - Bowel regimen resolved.   Physical deconditioning -PT/OT eval   Elevated D-dimer: Unreliable in the setting of ESRD.  Low suspicion for VTE.   Elevated troponin: Likely demand ischemia and delayed clearance   Hyperkalemia: Resolved   History of SLE: Not on meds.   -Outpatient follow-up with [facility]ology at [facility]            Time spent 35 minutes  Vital signs Vitals:   01/14/24 1200 01/14/24 1213 01/14/24  1218 01/14/24 1245  BP: (!) 151/134 (!) 152/105 (!) 148/102   Pulse: 78 80 80   Temp:   97.8 F (36.6 C)   Resp: 19 16 18    Height:      Weight:   Comment: BED NOT ACCURATE 72.8 kg  SpO2: 100% 100% 100% 99%  TempSrc:      BMI (Calculated):     25.92     Discharge exam  GENERAL: No apparent distress.  Nontoxic. HEENT: MMM.  Vision and hearing grossly intact.  NECK: Supple.  No apparent JVD.  RESP:  No IWOB.  Fair aeration bilaterally. CVS:  RRR. Heart sounds normal.  ABD/GI/GU: BS+. Abd soft, NTND.  MSK/EXT:  Moves extremities. No apparent deformity. No edema.  SKIN: no apparent skin lesion or wound NEURO: Awake and alert. Oriented appropriately.  No apparent focal neuro deficit. PSYCH: Calm. Normal affect.   Discharge Instructions Discharge Instructions     Diet - low sodium heart healthy   Complete by: As directed    Discharge instructions   Complete by: As directed    It has been a pleasure taking care of you!  You were hospitalized due to shortness of breath, high blood pressure and right thigh/buttock pain.  Your breathing and blood pressure improved with medications and dialysis.  In regards to your right thigh/buttock swelling and pain, our surgeons recommended outpatient follow-up with your dermatologist.  We also recommend follow-up with your rheumatologist for your lupus.   Note that oxycodone  can increase your risk of drowsiness, sedation, constipation, impaired judgment, respiratory depression that could potentially lead to death and impaired balance that could increase risk of fall.  We strongly recommend minimizing use.  We do not recommend driving, operating machinery or other activity that requires similar mental and physical engagement.     Take care,   Increase activity slowly   Complete by: As directed    No wound care   Complete by: As directed       Allergies as of 01/14/2024       Reactions   Iodine  Hives, Shortness Of Breath   Lisinopril  Anaphylaxis   Angioedema   Gabapentin Other (See Comments)   Reaction: Tremor (intolerance); tremor   Hydroxychloroquine  Other (See Comments)   Pt reports making her skin peel really bad   Vicodin [hydrocodone -acetaminophen ] Itching, Rash         Medication List     TAKE these medications    amLODipine  10 MG tablet Commonly known as: NORVASC  Take 10 mg by mouth daily.   calcitRIOL  0.25 MCG capsule Commonly known as: ROCALTROL  Take 1 tablet by mouth every Monday, Wednesday, and Friday with hemodialysis.   carvedilol  25 MG tablet Commonly known as: COREG  Take 1 tablet (25 mg total) by mouth 2 (two) times daily with a meal. What changed:  how much to take when to take this   furosemide  80 MG tablet Commonly known as: LASIX  Take 1 tablet (80 mg total) by mouth daily. What changed: how much to take   irbesartan  300 MG tablet Commonly known as: AVAPRO  Take 300 mg by mouth daily.   isosorbide mononitrate 30 MG 24 hr tablet Commonly known as: IMDUR Take 1 tablet (30 mg total) by mouth daily.   medroxyPROGESTERone  150 MG/ML injection Commonly known as: DEPO-PROVERA  Inject 1 mL (150 mg total) into the muscle every 3 (three) months.   Narcan 4 MG/0.1ML Liqd nasal spray kit Generic drug: naloxone Place 1 spray into the nose once.  If needed for emergency overdose.   nitroGLYCERIN  0.4 MG SL tablet Commonly known as: NITROSTAT  Place 1 tablet (0.4 mg total) under the tongue every 5 (five) minutes as needed for chest pain.   ondansetron  4 MG disintegrating tablet Commonly known as: ZOFRAN -ODT Take 1 tablet (4 mg total) by mouth every 8 (eight) hours as needed for nausea or vomiting.   Oxycodone  HCl 10 MG Tabs Take 1 tablet (10 mg total) by mouth every 8 (eight) hours as needed for up to 7 days. What changed:  when to take this reasons to take this   senna-docusate 8.6-50 MG tablet Commonly known as: Senokot-S Take 1 tablet by mouth 2 (two) times daily between meals as needed for mild constipation.   tiZANidine  4 MG tablet Commonly known as: ZANAFLEX  Take 4 mg by mouth every 8 (eight) hours as needed for muscle spasms.        Consultations: Nephrology General Surgery  Procedures/Studies:   CT FEMUR  RIGHT WO CONTRAST Result Date: 01/12/2024 CLINICAL DATA:  Soft tissue infection suspected, thigh. History of end-stage renal disease, lupus, and mixed connective tissue disease. EXAM: CT OF THE LOWER RIGHT EXTREMITY WITHOUT CONTRAST TECHNIQUE: Multidetector CT imaging of the right lower extremity was performed according to the standard protocol. RADIATION DOSE REDUCTION: This exam was performed according to the departmental dose-optimization program which includes automated exposure control, adjustment of the mA and/or kV according to patient size and/or use of iterative reconstruction technique. COMPARISON:  CT pelvis 01/11/2024, CT abdomen and pelvis 11/14/2023 FINDINGS: Bones/Joint/Cartilage Increased bone density is seen diffusely, again compatible with the patient's history of end-stage renal disease and chronic renal osteodystrophy. No cortical erosion. No acute fracture. Ligaments Suboptimally assessed by CT. Muscles and Tendons Normal size and density of the regional musculature. No tendon tear is seen. Soft tissues There are again numerous dystrophic calcifications seen within the midline posterior buttocks, the bilateral more inferior buttocks, as on prior CT. Additional mild just over calcifications within the posterolateral proximal right thigh (axial series 4, images 141 through 150). There are again extensive calcification seen throughout the distal anterolateral greater than anteromedial thigh and anterior knee subcutaneous fat and more mildly within the posterolateral distal thigh. The left thigh is partially visualized and similar, symmetric calcifications are seen anterior to the left knee and minimally at the posterior medial aspect of the knee. Compared to 11/14/2023, there is mildly increased inflammatory stranding at the deep aspect of the inferior right buttock and proximal posterior thigh dystrophic calcifications (axial series 4 images 80 through 123). However, no walled-off abscess is  seen. IMPRESSION: 1. Extensive dystrophic calcifications are again seen within the midline posterior buttocks, bilateral more inferior buttocks, and throughout the distal anterolateral greater than anteromedial thigh and anterior knee subcutaneous fat. These are chronic and again likely secondary to the patient's history of chronic renal failure (calcinosis or chronic renal failure) or connective tissue disease. 2. Compared to 11/14/2023, there is mildly increased inflammatory stranding at the deep aspect of the inferior right buttock and proximal posterior thigh cluster of dystrophic calcifications. However, no walled-off abscess is seen. 3. Increased bone density is seen diffusely, again compatible with the patient's history of end-stage renal disease and chronic renal osteodystrophy. Electronically Signed   By: Bertina Broccoli M.D.   On: 01/12/2024 21:18   CT PELVIS LIMITED WO CONTRAST Result Date: 01/12/2024 CLINICAL DATA:  28 year old female with buttock abscess, history of end-stage renal disease, lupus, mixed connective tissue disease, left ventricular hypertrophy. EXAM: CT  PELVIS WITHOUT CONTRAST TECHNIQUE: Multidetector CT imaging of the pelvis was performed following the standard protocol without intravenous contrast. RADIATION DOSE REDUCTION: This exam was performed according to the departmental dose-optimization program which includes automated exposure control, adjustment of the mA and/or kV according to patient size and/or use of iterative reconstruction technique. COMPARISON:  Noncontrast CT Abdomen and Pelvis 11/14/2023. FINDINGS: Urinary Tract: Kidneys not included. No evidence of hydroureter. Unremarkable urinary bladder. Bowel: Oral contrast retained with stool in the visible large bowel. No dilated loops are identified. Normal appendix containing gas on series 6, image 62. No pneumoperitoneum. Vascular/Lymphatic: Calcified atherosclerosis of the distal abdominal aorta. Vascular patency is not  evaluated in the absence of IV contrast. No convincing lymphadenopathy. Reproductive:  Negative noncontrast appearance. Other: Small volume free fluid layering dependent in the pelvis, simple fluid density. This is similar to March and probably physiologic. Musculoskeletal: Extensive, bulky dystrophic calcifications of the skin and subcutaneous soft tissues along the gluteal fold, posterior proximal thighs. These were more included on the March CT Abdomen and Pelvis. No convincing inflammation directly affecting the perineum or gluteal fold. However, in the posteromedial proximal thigh on series 6, image 123 there is an area of confluent inflammatory stranding, phlegmon which is only partially visible. No soft tissue gas identified. Background bone mineralization suggestive of renal osteodystrophy. No acute osseous abnormality identified. IMPRESSION: 1. Partially visible Phlegmon-like area in the posteromedial proximal Right thigh. No Direct involvement of the gluteal folds or perineum. If additional visualization of this presumed right thigh infection is needed then Right thigh/lower extremity CT would probably be most valuable given extensive skin and subcutaneous calcifications which would likely limit ultrasound (see #2). 2. Extensive chronic dystrophic calcifications of the skin and subcutaneous soft tissues along the gluteal fold, both posterior proximal thighs. These were more included on the March CT, nonspecific but may be related to the patient's connective tissue disease diagnosis. 3. Renal osteodystrophy suspected.  No acute osseous abnormality. 4. Small volume free fluid in the pelvis is similar to March is most likely physiologic. 5.  Aortic Atherosclerosis (ICD10-I70.0). Electronically Signed   By: Marlise Simpers M.D.   On: 01/12/2024 12:44   DG Chest Portable 1 View Result Date: 01/11/2024 CLINICAL DATA:  Shortness of breath. EXAM: PORTABLE CHEST 1 VIEW COMPARISON:  12/21/2023 FINDINGS: The cardio  pericardial silhouette is enlarged. Vascular congestion with basilar predominant interstitial opacity, suspicious for edema. No dense focal airspace consolidation or pleural effusion. No acute bony abnormality. Telemetry leads overlie the chest. IMPRESSION: Enlargement of the cardiopericardial silhouette with vascular congestion and basilar predominant interstitial opacity, suspicious for edema. Electronically Signed   By: Donnal Fusi M.D.   On: 01/11/2024 05:00   DG Chest Portable 1 View Result Date: 12/22/2023 CLINICAL DATA:  Shortness of breath EXAM: PORTABLE CHEST 1 VIEW COMPARISON:  11/17/2023. FINDINGS: Unchanged cardiomegaly. Blunting of the costophrenic angle on the right is noted. Increase interstitial markings are noted bilaterally. No airspace consolidation, atelectasis or pneumothorax. IMPRESSION: Cardiomegaly and increased interstitial markings compatible with mild congestive heart failure. Electronically Signed   By: Kimberley Penman M.D.   On: 12/22/2023 05:19       The results of significant diagnostics from this hospitalization (including imaging, microbiology, ancillary and laboratory) are listed below for reference.     Microbiology: Recent Results (from the past 240 hours)  Resp panel by RT-PCR (RSV, Flu A&B, Covid) Anterior Nasal Swab     Status: None   Collection Time: 01/11/24  3:55 AM  Specimen: Anterior Nasal Swab  Result Value Ref Range Status   SARS Coronavirus 2 by RT PCR NEGATIVE NEGATIVE Final   Influenza A by PCR NEGATIVE NEGATIVE Final   Influenza B by PCR NEGATIVE NEGATIVE Final    Comment: (NOTE) The Xpert Xpress SARS-CoV-2/FLU/RSV plus assay is intended as an aid in the diagnosis of influenza from Nasopharyngeal swab specimens and should not be used as a sole basis for treatment. Nasal washings and aspirates are unacceptable for Xpert Xpress SARS-CoV-2/FLU/RSV testing.  Fact Sheet for Patients: BloggerCourse.com  Fact Sheet  for Healthcare Providers: SeriousBroker.it  This test is not yet approved or cleared by the [place]  FDA and has been authorized for detection and/or diagnosis of SARS-CoV-2 by FDA under an Emergency Use Authorization (EUA). This EUA will remain in effect (meaning this test can be used) for the duration of the COVID-19 declaration under Section 564(b)(1) of the Act, 21 U.S.C. section 360bbb-3(b)(1), unless the authorization is terminated or revoked.     Resp Syncytial Virus by PCR NEGATIVE NEGATIVE Final    Comment: (NOTE) Fact Sheet for Patients: BloggerCourse.com  Fact Sheet for Healthcare Providers: SeriousBroker.it  This test is not yet approved or cleared by the [place]  FDA and has been authorized for detection and/or diagnosis of SARS-CoV-2 by FDA under an Emergency Use Authorization (EUA). This EUA will remain in effect (meaning this test can be used) for the duration of the COVID-19 declaration under Section 564(b)(1) of the Act, 21 U.S.C. section 360bbb-3(b)(1), unless the authorization is terminated or revoked.  Performed at [facility], [address] [place], [place] [address]      Labs:  CBC: Recent Labs  Lab 01/11/24 0347 01/13/24 0705  WBC 9.5 6.9  NEUTROABS 5.2  --   HGB 8.3* 9.0*  HCT 27.0* 28.7*  MCV 97.8 94.7  PLT 217 211   BMP &GFR Recent Labs  Lab 01/11/24 0347 01/13/24 0705  NA 138 135  K 5.9* 4.6  CL 98 93*  CO2 20* 25  GLUCOSE 77 107*  BUN 59* 43*  CREATININE 11.10* 8.97*  CALCIUM  9.7 9.7  MG  --  2.2  PHOS  --  8.3*   Estimated Creatinine Clearance: 9.5 mL/min (A) (by C-G formula based on SCr of 8.97 mg/dL (H)). Liver & Pancreas: Recent Labs  Lab 01/11/24 0347 01/13/24 0705  AST 23  --   ALT 9  --   ALKPHOS 67  --   BILITOT 0.9  --   PROT 7.8  --   ALBUMIN  3.2* 3.0*   No results for input(s): "LIPASE", "AMYLASE" in the last  168 hours. No results for input(s): "AMMONIA" in the last 168 hours. Diabetic: No results for input(s): "HGBA1C" in the last 72 hours. No results for input(s): "GLUCAP" in the last 168 hours. Cardiac Enzymes: No results for input(s): "CKTOTAL", "CKMB", "CKMBINDEX", "TROPONINI" in the last 168 hours. No results for input(s): "PROBNP" in the last 8760 hours. Coagulation Profile: No results for input(s): "INR", "PROTIME" in the last 168 hours. Thyroid Function Tests: No results for input(s): "TSH", "T4TOTAL", "FREET4", "T3FREE", "THYROIDAB" in the last 72 hours. Lipid Profile: No results for input(s): "CHOL", "HDL", "LDLCALC", "TRIG", "CHOLHDL", "LDLDIRECT" in the last 72 hours. Anemia Panel: No results for input(s): "VITAMINB12", "FOLATE", "FERRITIN", "TIBC", "IRON ", "RETICCTPCT" in the last 72 hours. Urine analysis:    Component Value Date/Time   COLORURINE STRAW (A) 11/15/2023 1835   APPEARANCEUR CLEAR 11/15/2023 1835   LABSPEC 1.006 11/15/2023  1835   PHURINE 8.0 11/15/2023 1835   GLUCOSEU 50 (A) 11/15/2023 1835   HGBUR NEGATIVE 11/15/2023 1835   BILIRUBINUR NEGATIVE 11/15/2023 1835   BILIRUBINUR Negative 02/10/2018 1602   KETONESUR NEGATIVE 11/15/2023 1835   PROTEINUR 100 (A) 11/15/2023 1835   UROBILINOGEN 0.2 02/10/2018 1602   UROBILINOGEN 1.0 03/30/2015 2328   NITRITE NEGATIVE 11/15/2023 1835   LEUKOCYTESUR NEGATIVE 11/15/2023 1835   Sepsis Labs: Invalid input(s): "PROCALCITONIN", "LACTICIDVEN"   SIGNED:  Theadore Finger, MD  [facility] 01/14/2024, 2:04 PM

## 2024-01-14 NOTE — Progress Notes (Signed)
 Pt has rested at intervals. Medicated x2 for c/o pain, with dilaudid . Tolerated ice chips. Ambulatory within the room. Call light in reach. Bed in low positionl. Sr x2 elevated.

## 2024-01-14 NOTE — Telephone Encounter (Signed)
 I called Linda [name] and she is planning to attend her regular HD sessions at [facility] on MWF.

## 2024-01-14 NOTE — Progress Notes (Addendum)
 Pt receives out-pt HD at [facility] on MWF 7:10 am chair time. Will assist as needed.   Lauraine Polite Renal Navigator [phone]  Addendum at 1:50 pm: D/C order noted. Contacted [facility] to be advised of pt's d/c today and that pt should resume care on Monday.

## 2024-01-14 NOTE — Progress Notes (Signed)
 OT Cancellation Note  Patient Details Name: Linda [name] MRN: [id number] DOB: April 12, 1996   Cancelled Treatment:    Reason Eval/Treat Not Completed: Patient at procedure or test/ unavailable. Pt. Having HD.  Will check back as able.   Leory Rands [place]-COTA/L  01/14/2024, 9:53 AM  Howell Macintosh, COTA/L Acute Rehabilitation [phone]

## 2024-01-14 NOTE — Progress Notes (Addendum)
 [facility] Progress Note  Subjective:  Seen in HD, 4 L off, came off 72.5kg 99% on RA, will dc O2 SOB resolved  Vitals:   01/14/24 1130 01/14/24 1200 01/14/24 1213 01/14/24 1218  BP: (!) 148/108 (!) 151/134 (!) 152/105 (!) 148/102  Pulse: 73 78 80 80  Resp: 19 19 16 18   Temp:    97.8 F (36.6 C)  TempSrc:      SpO2: 100% 100% 100% 100%  Weight:      Height:        Exam: Gen alert, no distress, [place] O2 Looks comfortable No jvd or bruits Chest clear bilat to bases RRR no MRG Abd soft ntnd no mass or ascites +bs Ext no LE edema Neuro is alert, Ox 3 , nf     AVG +bruit        Renal-related home meds: Norvasc  10 daily Coreg  25 twice daily Lasix  80 mg 2 pills daily Irbesartan  300 MGs daily    OP HD: [facility] MWF  3.5h   B350   71.5kg  AVG   heparin  none       Assessment/ Plan: SOB: suspected vol overload, early IS edema on CXR. Stable on [place] O2. She had HD x 1 here w/ 3L and 4L off. Down to dry wt today and off O2 at 99%. OK for dc.  Buttock/ UE masses: this is NOT calciphylaxis. Gout, hidradenitis, calcinosis, other, not sure. She has SLE. Gen surg will not do biopsy here since this is an OP procedure. They recommended derm consult as outpt.  - pt said she would reschedule the dermatology appt - also encouraged her to see her lupus MD at [facility]  ESRD: on HD MWF. Had HD this am. As above, no change to dry wt.  HTN: bps still a bit high, cont meds  Anemia of esrd: Hb 8-9, follow Secondary hyperparathyroidism: Ca in range, add phos Dispo: as above    [name] MD  [facility] 01/14/2024, 12:45 PM  Recent Labs  Lab 01/11/24 0347 01/13/24 0705  HGB 8.3* 9.0*  ALBUMIN  3.2* 3.0*  CALCIUM  9.7 9.7  PHOS  --  8.3*  CREATININE 11.10* 8.97*  K 5.9* 4.6   No results for input(s): "IRON ", "TIBC", "FERRITIN" in the last 168 hours. Inpatient medications:  amLODipine   10 mg Oral Daily   calcitRIOL   0.25 mcg Oral Q M,W,F-HD   carvedilol   25 mg Oral BID WC    Chlorhexidine  Gluconate Cloth  6 each Topical Q0600   furosemide   80 mg Intravenous Daily   heparin   5,000 Units Subcutaneous Q8H   irbesartan   300 mg Oral Daily   isosorbide mononitrate  30 mg Oral Daily   senna-docusate  2 tablet Oral Daily   simethicone   80 mg Oral QID    HYDROmorphone  (DILAUDID ) injection, labetalol 

## 2024-01-14 NOTE — Progress Notes (Signed)
   01/14/24 1218  Vitals  Temp 97.8 F (36.6 C)  Pulse Rate 80  Resp 18  BP (!) 148/102  SpO2 100 %  Weight  (BED NOT ACCURATE)  Oxygen Therapy  Patient Activity (if Appropriate) In bed  Pulse Oximetry Type Continuous  Oximetry Probe Site Changed No  Post Treatment  Dialyzer Clearance Lightly streaked  Hemodialysis Intake (mL) 0 mL  Liters Processed 72.8  Fluid Removed (mL) 4000 mL  Tolerated HD Treatment Yes  AVG/AVF Arterial Site Held (minutes) 8 minutes  AVG/AVF Venous Site Held (minutes) 8 minutes   Received patient in bed to unit.  Alert and oriented.  Informed consent signed and in chart.   TX duration:3.5HRS  Patient tolerated well.  Transported back to the room  Alert, without acute distress.  Hand-off given to patient's nurse.   Access used: LAVG Access issues: NONE  Total UF removed: 4L Medication(s) given: PAIN MED   [name]

## 2024-01-17 DIAGNOSIS — D631 Anemia in chronic kidney disease: Secondary | ICD-10-CM | POA: Diagnosis not present

## 2024-01-17 DIAGNOSIS — D689 Coagulation defect, unspecified: Secondary | ICD-10-CM | POA: Diagnosis not present

## 2024-01-17 DIAGNOSIS — N2581 Secondary hyperparathyroidism of renal origin: Secondary | ICD-10-CM | POA: Diagnosis not present

## 2024-01-17 DIAGNOSIS — N186 End stage renal disease: Secondary | ICD-10-CM | POA: Diagnosis not present

## 2024-01-17 DIAGNOSIS — Z992 Dependence on renal dialysis: Secondary | ICD-10-CM | POA: Diagnosis not present

## 2024-01-17 DIAGNOSIS — D509 Iron deficiency anemia, unspecified: Secondary | ICD-10-CM | POA: Diagnosis not present

## 2024-01-17 NOTE — Progress Notes (Signed)
 [facility] Liaison Note  01/17/2024  DAVONDA AUSLEY June 11, 1996 [id number]  Location: RN Hospital Liaison screened the patient remotely at [facility].  Insurance: [employer]   Iwalani Templeton Zwilling is a 28 y.o. female who is a Primary Care Patient pending appointment with Aldine Humphreys, NP -[employer] [place] [employer] at  [place]. The patient was screened for 30 day readmission hospitalization with noted extreme risk score for unplanned readmission risk with 6 IP/1 ED in 6 months.  Patient is currently active with [facility] DSNP care management services noted in [facility].  [facility] will not engage due to DSNP involvement for case managements services at this time.  Charted review of patient's electronic medical record reveals patient was admitted with CHF.   [facility] Care Management/Population Health does not replace or interfere with any arrangements made by the Inpatient Transition of Care team.   For questions contact:   Lilla Reichert, RN, [facility] Liaison    [facility], Population Health Office Hours MTWF  8:00 am-6:00 pm Direct Dial : [phone] mobile @ [email or web address]

## 2024-01-19 DIAGNOSIS — D631 Anemia in chronic kidney disease: Secondary | ICD-10-CM | POA: Diagnosis not present

## 2024-01-19 DIAGNOSIS — N2581 Secondary hyperparathyroidism of renal origin: Secondary | ICD-10-CM | POA: Diagnosis not present

## 2024-01-19 DIAGNOSIS — D689 Coagulation defect, unspecified: Secondary | ICD-10-CM | POA: Diagnosis not present

## 2024-01-19 DIAGNOSIS — Z992 Dependence on renal dialysis: Secondary | ICD-10-CM | POA: Diagnosis not present

## 2024-01-19 DIAGNOSIS — N186 End stage renal disease: Secondary | ICD-10-CM | POA: Diagnosis not present

## 2024-01-19 DIAGNOSIS — D509 Iron deficiency anemia, unspecified: Secondary | ICD-10-CM | POA: Diagnosis not present

## 2024-01-21 DIAGNOSIS — Z992 Dependence on renal dialysis: Secondary | ICD-10-CM | POA: Diagnosis not present

## 2024-01-21 DIAGNOSIS — D689 Coagulation defect, unspecified: Secondary | ICD-10-CM | POA: Diagnosis not present

## 2024-01-21 DIAGNOSIS — D631 Anemia in chronic kidney disease: Secondary | ICD-10-CM | POA: Diagnosis not present

## 2024-01-21 DIAGNOSIS — N2581 Secondary hyperparathyroidism of renal origin: Secondary | ICD-10-CM | POA: Diagnosis not present

## 2024-01-21 DIAGNOSIS — D509 Iron deficiency anemia, unspecified: Secondary | ICD-10-CM | POA: Diagnosis not present

## 2024-01-21 DIAGNOSIS — N186 End stage renal disease: Secondary | ICD-10-CM | POA: Diagnosis not present

## 2024-01-24 DIAGNOSIS — D509 Iron deficiency anemia, unspecified: Secondary | ICD-10-CM | POA: Diagnosis not present

## 2024-01-24 DIAGNOSIS — Z992 Dependence on renal dialysis: Secondary | ICD-10-CM | POA: Diagnosis not present

## 2024-01-24 DIAGNOSIS — D631 Anemia in chronic kidney disease: Secondary | ICD-10-CM | POA: Diagnosis not present

## 2024-01-24 DIAGNOSIS — N186 End stage renal disease: Secondary | ICD-10-CM | POA: Diagnosis not present

## 2024-01-24 DIAGNOSIS — D689 Coagulation defect, unspecified: Secondary | ICD-10-CM | POA: Diagnosis not present

## 2024-01-24 DIAGNOSIS — N2581 Secondary hyperparathyroidism of renal origin: Secondary | ICD-10-CM | POA: Diagnosis not present

## 2024-01-26 ENCOUNTER — Ambulatory Visit (INDEPENDENT_AMBULATORY_CARE_PROVIDER_SITE_OTHER): Admitting: Family Medicine

## 2024-01-26 ENCOUNTER — Encounter: Payer: Self-pay | Admitting: Family Medicine

## 2024-01-26 DIAGNOSIS — Z7689 Persons encountering health services in other specified circumstances: Secondary | ICD-10-CM | POA: Diagnosis not present

## 2024-01-26 DIAGNOSIS — D689 Coagulation defect, unspecified: Secondary | ICD-10-CM | POA: Diagnosis not present

## 2024-01-26 DIAGNOSIS — M3214 Glomerular disease in systemic lupus erythematosus: Secondary | ICD-10-CM | POA: Diagnosis not present

## 2024-01-26 DIAGNOSIS — L0231 Cutaneous abscess of buttock: Secondary | ICD-10-CM

## 2024-01-26 DIAGNOSIS — N186 End stage renal disease: Secondary | ICD-10-CM | POA: Diagnosis not present

## 2024-01-26 DIAGNOSIS — D509 Iron deficiency anemia, unspecified: Secondary | ICD-10-CM | POA: Diagnosis not present

## 2024-01-26 DIAGNOSIS — N2581 Secondary hyperparathyroidism of renal origin: Secondary | ICD-10-CM | POA: Diagnosis not present

## 2024-01-26 DIAGNOSIS — Z992 Dependence on renal dialysis: Secondary | ICD-10-CM | POA: Diagnosis not present

## 2024-01-26 DIAGNOSIS — D631 Anemia in chronic kidney disease: Secondary | ICD-10-CM | POA: Diagnosis not present

## 2024-01-26 NOTE — Patient Instructions (Addendum)
-  It was nice to meet you and look forward to taking care of you.  -Placed a referral to pain clinic for calciphylaxis and lupus. -Placed another referral to [facility] for bilateral buttock abscess. -Please call the office or send a MyChart message if you do not receive a phone call or a MyChart message about appointment in 2 weeks.  -Recommend to follow back up with dermatology.  -Follow up in 1 month.

## 2024-01-26 NOTE — Progress Notes (Signed)
 New Patient Office Visit  Subjective   Patient ID: Linda [name], female    DOB: 30-Nov-1995  Age: 28 y.o. MRN: [id number]  CC:  Chief Complaint  Patient presents with   Establish Care    Referral pain management.     HPI Linda [name] presents to establish care with new provider.   Patients previous primary care provider: [facility] with Chianne Kline, [place].   Specialist: [facility] for [facility]- Dr. Elana Grayer (upcoming appointment 06/04) [facility] [place] with Neomi Banks, NP (upcoming appt 06/05)  [facility] Dr. Aldona Ziolkowska (upcoming appointment 07/22)  [facility] with Dr. Louana Roup   Patient is asking for a referral to pain management. She reports she previously was under the care of [facility] for pain management off of [address] Last visit was 2-3 months ago. Patient reports she was taking Oxycodone  15mg  tablet, 5 times a day. Last had medication about a week ago. But, it was only 10 mg from recent hospital admission.   All medications are being managed by specialist.   Outpatient Encounter Medications as of 01/26/2024  Medication Sig   amLODipine  (NORVASC ) 10 MG tablet Take 10 mg by mouth daily.   calcitRIOL  (ROCALTROL ) 0.25 MCG capsule Take 1 tablet by mouth every Monday, Wednesday, and Friday with hemodialysis.   carvedilol  (COREG ) 25 MG tablet Take 1 tablet (25 mg total) by mouth 2 (two) times daily with a meal. (Patient taking differently: Take 100 mg by mouth daily.)   furosemide  (LASIX ) 80 MG tablet Take 1 tablet (80 mg total) by mouth daily. (Patient taking differently: Take 160 mg by mouth daily.)   lidocaine -prilocaine  (EMLA ) cream Apply 1 Application topically once.   medroxyPROGESTERone  (DEPO-PROVERA ) 150 MG/ML injection Inject 1 mL (150 mg total) into  the muscle every 3 (three) months.   naloxone (NARCAN) nasal spray 4 mg/0.1 mL Place 1 spray into the nose once. If needed for emergency overdose.   nitroGLYCERIN  (NITROSTAT ) 0.4 MG SL tablet Place 1 tablet (0.4 mg total) under the tongue every 5 (five) minutes as needed for chest pain.   ondansetron  (ZOFRAN -ODT) 4 MG disintegrating tablet Take 1 tablet (4 mg total) by mouth every 8 (eight) hours as needed for nausea or vomiting.   senna-docusate (SENOKOT-S) 8.6-50 MG tablet Take 1 tablet by mouth 2 (two) times daily between meals as needed for mild constipation.   tiZANidine  (ZANAFLEX ) 4 MG tablet Take 4 mg by mouth every 8 (eight) hours as needed for muscle spasms.   [DISCONTINUED] irbesartan  (AVAPRO ) 300 MG tablet Take 300 mg by mouth daily.   [DISCONTINUED] isosorbide  mononitrate (IMDUR ) 30 MG 24 hr tablet Take 1 tablet (30 mg total) by mouth daily.   No facility-administered encounter medications on file as of 01/26/2024.    Past Medical History:  Diagnosis Date   Abnormal ECG    a. In setting of LVH w/ repolarization abnormalities; b. 03/2019 MV: No ischemia/infarct. EF 55-65%.   Angioedema    ESRD (end stage renal disease) (HCC)    on HD (M,W,F)   GERD (gastroesophageal reflux disease)    Hypertension    Lupus    LVH (left ventricular hypertrophy)    a. 11/2017 Echo: EF 50-55%. triv AI. Mild MR. Mod L and mild R atrial enlargement. Nl RV size/fxn. Small pericardial effusion w/o tampondae; b. 03/2018 Echo: EF 55-60%,  restrictive filling pattern. Nl RV size/fxn. Sev LAE. Mild MR, mild to mod TR/PR. Mod PAH. Triv effusion.   Migraines    Mixed connective tissue disease (HCC)    Previous Dialysis patient [facility])    a. Off HD since late 2020.   Septic prepatellar bursitis of right knee 08/19/2017    Past Surgical History:  Procedure Laterality Date   GRAFT APPLICATION Left 03/30/2019   Procedure: BRACHIAL CEPHALIC GRAFT CREATION;  Surgeon: Celso College, MD;  Location: [facility] ORS;  Service:  Vascular;  Laterality: Left;   I & D EXTREMITY Right 08/06/2017   Procedure: IRRIGATION AND DEBRIDEMENT KNEE;  Surgeon: Laneta Pintos, MD;  Location: [facility] OR;  Service: Orthopedics;  Laterality: Right;   IR THROMBECTOMY AV FISTULA W/THROMBOLYSIS/PTA/STENT INC/SHUNT/IMG LT Left 05/18/2022   IR US  GUIDE VASC ACCESS LEFT  05/18/2022   MULTIPLE TOOTH EXTRACTIONS      Family History  Problem Relation Age of Onset   Lupus Mother    Hypertension Mother    Kidney disease Mother    Heart Problems Mother    Coronary artery disease Mother 71       stent   Diabetes Maternal Grandmother     Social History   Socioeconomic History   Marital status: Single    Spouse name: Not on file   Number of children: 0   Years of education: Not on file   Highest education level: Some college, no degree  Occupational History   Not on file  Tobacco Use   Smoking status: Every Day    Current packs/day: 0.25    Average packs/day: 0.3 packs/day for 9.0 years (2.2 ttl pk-yrs)    Types: E-cigarettes, Cigarettes    Start date: 02/11/2015   Smokeless tobacco: Never  Vaping Use   Vaping status: Every Day  Substance and Sexual Activity   Alcohol use: No    Alcohol/week: 0.0 standard drinks of alcohol   Drug use: No   Sexual activity: Yes    Partners: Male    Birth control/protection: None  Other Topics Concern   Not on file  Social History Narrative   Lives with boyfriend   Social Drivers of Health   Financial Resource Strain: Low Risk  (09/10/2023)   Received from [facility]   Overall Financial Resource Strain (CARDIA)    Difficulty of Paying Living Expenses: Not hard at all  Food Insecurity: No Food Insecurity (01/11/2024)   Hunger Vital Sign    Worried About Running Out of Food in the Last Year: Never true    Ran Out of Food in the Last Year: Never true  Recent Concern: Food Insecurity - Food Insecurity Present (11/25/2023)   Hunger Vital Sign    Worried About Running Out of Food in the Last  Year: Sometimes true    Ran Out of Food in the Last Year: Sometimes true  Transportation Needs: Unmet Transportation Needs (01/11/2024)   PRAPARE - Transportation    Lack of Transportation (Medical): Yes    Lack of Transportation (Non-Medical): Yes  Physical Activity: Inactive (01/26/2024)   Exercise Vital Sign    Days of Exercise per Week: 0 days    Minutes of Exercise per Session: 0 min  Stress: No Stress Concern Present (01/26/2024)   [employer] of Occupational Health - Occupational Stress Questionnaire    Feeling of Stress : Only a little  Social Connections: Socially Isolated (01/26/2024)   Social Connection and Isolation Panel [NHANES]    Frequency of  Communication with Friends and Family: Three times a week    Frequency of Social Gatherings with Friends and Family: Three times a week    Attends Religious Services: Never    Active Member of Clubs or Organizations: No    Attends Banker Meetings: Never    Marital Status: Never married  Intimate Partner Violence: Not At Risk (01/11/2024)   Humiliation, Afraid, Rape, and Kick questionnaire    Fear of Current or Ex-Partner: No    Emotionally Abused: No    Physically Abused: No    Sexually Abused: No    ROS See HPI above    Objective  BP (!) 164/90   Pulse 100   Temp 98.5 F (36.9 C) (Oral)   Ht 5\' 6"  (1.676 m)   Wt 169 lb (76.7 kg)   LMP  (LMP Unknown) Comment: injection  SpO2 95%   BMI 27.28 kg/m   Physical Exam Vitals reviewed.  Constitutional:      General: She is not in acute distress.    Appearance: Normal appearance. She is not ill-appearing, toxic-appearing or diaphoretic.  HENT:     Head: Normocephalic and atraumatic.  Eyes:     General:        Right eye: No discharge.        Left eye: No discharge.     Conjunctiva/sclera: Conjunctivae normal.  Cardiovascular:     Rate and Rhythm: Normal rate and regular rhythm.     Heart sounds: Normal heart sounds. No murmur heard.    No friction  rub. No gallop.  Pulmonary:     Effort: Pulmonary effort is normal. No respiratory distress.     Breath sounds: Normal breath sounds.  Musculoskeletal:        General: Normal range of motion.  Skin:    General: Skin is warm and dry.     Findings: Abscess (Non open hard abscess on bilateral buttock cheek.) present.  Neurological:     General: No focal deficit present.     Mental Status: She is alert and oriented to person, place, and time. Mental status is at baseline.  Psychiatric:        Mood and Affect: Mood normal.        Behavior: Behavior normal.        Thought Content: Thought content normal.        Judgment: Judgment normal.      Assessment & Plan:  Calciphylaxis -     Ambulatory referral to Pain Clinic  Systemic lupus erythematosus with glomerular disease, unspecified SLE type (HCC) -     Ambulatory referral to Pain Clinic  Abscess of buttock, right -     Ambulatory referral to General Surgery  Left buttock abscess -     Ambulatory referral to General Surgery  Encounter to establish care   1.Review health maintenance:  -Covid booster: Declines  -Tdap vaccine: Declines  -AWV: Needs visit-telehealth -PNA vaccine: Declines  2.Placed a referral to pain clinic for calciphylaxis and lupus. 3.Placed another referral to [facility] for bilateral buttock abscess. 4.Recommend to follow back up with dermatology.  Return in about 1 month (around 02/26/2024) for Needs AWV-telehealth with Dr. Burdette Carolin; , follow-up.   Shterna Laramee, NP

## 2024-01-27 ENCOUNTER — Telehealth: Payer: Self-pay | Admitting: *Deleted

## 2024-01-27 NOTE — Telephone Encounter (Signed)
 Talked to referral coordinator to get different pain management.

## 2024-01-27 NOTE — Telephone Encounter (Signed)
 Copied from CRM [id number]. Topic: Referral - Question >> Jan 27, 2024  9:15 AM Dewanda Foots wrote: Reason for CRM: Jenette Mitchell from [facility] called in stating we sent over a referral for the patient to have surgery but because of her symptoms, there is nothing they can do for her surgically on their end.   She states that if the Dr wants a punch biopsy done on her, dermatology would be the recommended course of action.  Please call Jenette Mitchell if you have any further [phone]

## 2024-01-27 NOTE — Telephone Encounter (Signed)
 Contacted pt and pt states she will make a appt with her dermatology

## 2024-01-27 NOTE — Telephone Encounter (Signed)
 Copied from CRM [id number]. Topic: Referral - Question >> Jan 27, 2024  2:47 PM [place] wrote: Reason for CRM: Patient looking for another place for pain management, she said [facility] does not do that anymore. Looking for another one.

## 2024-01-28 ENCOUNTER — Observation Stay (HOSPITAL_COMMUNITY)

## 2024-01-28 ENCOUNTER — Other Ambulatory Visit: Payer: Self-pay

## 2024-01-28 ENCOUNTER — Inpatient Hospital Stay (HOSPITAL_COMMUNITY)
Admission: EM | Admit: 2024-01-28 | Discharge: 2024-01-30 | DRG: 640 | Disposition: A | Discharge status: Home / Self Care | Attending: Internal Medicine | Admitting: Internal Medicine

## 2024-01-28 ENCOUNTER — Emergency Department (HOSPITAL_COMMUNITY)

## 2024-01-28 ENCOUNTER — Encounter (HOSPITAL_COMMUNITY): Payer: Self-pay

## 2024-01-28 DIAGNOSIS — G8929 Other chronic pain: Secondary | ICD-10-CM | POA: Diagnosis present

## 2024-01-28 DIAGNOSIS — Z8249 Family history of ischemic heart disease and other diseases of the circulatory system: Secondary | ICD-10-CM | POA: Diagnosis not present

## 2024-01-28 DIAGNOSIS — I120 Hypertensive chronic kidney disease with stage 5 chronic kidney disease or end stage renal disease: Secondary | ICD-10-CM | POA: Diagnosis not present

## 2024-01-28 DIAGNOSIS — Z841 Family history of disorders of kidney and ureter: Secondary | ICD-10-CM

## 2024-01-28 DIAGNOSIS — Z833 Family history of diabetes mellitus: Secondary | ICD-10-CM

## 2024-01-28 DIAGNOSIS — G43909 Migraine, unspecified, not intractable, without status migrainosus: Secondary | ICD-10-CM | POA: Diagnosis present

## 2024-01-28 DIAGNOSIS — R0989 Other specified symptoms and signs involving the circulatory and respiratory systems: Secondary | ICD-10-CM | POA: Diagnosis not present

## 2024-01-28 DIAGNOSIS — Z881 Allergy status to other antibiotic agents status: Secondary | ICD-10-CM

## 2024-01-28 DIAGNOSIS — J9601 Acute respiratory failure with hypoxia: Secondary | ICD-10-CM | POA: Diagnosis present

## 2024-01-28 DIAGNOSIS — D631 Anemia in chronic kidney disease: Secondary | ICD-10-CM | POA: Diagnosis present

## 2024-01-28 DIAGNOSIS — M329 Systemic lupus erythematosus, unspecified: Secondary | ICD-10-CM | POA: Diagnosis not present

## 2024-01-28 DIAGNOSIS — I10 Essential (primary) hypertension: Secondary | ICD-10-CM | POA: Diagnosis not present

## 2024-01-28 DIAGNOSIS — N2581 Secondary hyperparathyroidism of renal origin: Secondary | ICD-10-CM | POA: Diagnosis present

## 2024-01-28 DIAGNOSIS — J811 Chronic pulmonary edema: Secondary | ICD-10-CM | POA: Diagnosis present

## 2024-01-28 DIAGNOSIS — Z91041 Radiographic dye allergy status: Secondary | ICD-10-CM | POA: Diagnosis not present

## 2024-01-28 DIAGNOSIS — Z1152 Encounter for screening for COVID-19: Secondary | ICD-10-CM

## 2024-01-28 DIAGNOSIS — K219 Gastro-esophageal reflux disease without esophagitis: Secondary | ICD-10-CM | POA: Diagnosis present

## 2024-01-28 DIAGNOSIS — Z888 Allergy status to other drugs, medicaments and biological substances status: Secondary | ICD-10-CM

## 2024-01-28 DIAGNOSIS — Z8269 Family history of other diseases of the musculoskeletal system and connective tissue: Secondary | ICD-10-CM | POA: Diagnosis not present

## 2024-01-28 DIAGNOSIS — I517 Cardiomegaly: Secondary | ICD-10-CM | POA: Diagnosis not present

## 2024-01-28 DIAGNOSIS — J810 Acute pulmonary edema: Secondary | ICD-10-CM | POA: Diagnosis present

## 2024-01-28 DIAGNOSIS — Z79899 Other long term (current) drug therapy: Secondary | ICD-10-CM

## 2024-01-28 DIAGNOSIS — E8770 Fluid overload, unspecified: Secondary | ICD-10-CM | POA: Diagnosis not present

## 2024-01-28 DIAGNOSIS — Z992 Dependence on renal dialysis: Secondary | ICD-10-CM | POA: Diagnosis not present

## 2024-01-28 DIAGNOSIS — R0602 Shortness of breath: Secondary | ICD-10-CM | POA: Diagnosis not present

## 2024-01-28 DIAGNOSIS — Z885 Allergy status to narcotic agent status: Secondary | ICD-10-CM

## 2024-01-28 DIAGNOSIS — M351 Other overlap syndromes: Secondary | ICD-10-CM | POA: Diagnosis not present

## 2024-01-28 DIAGNOSIS — Z5982 Transportation insecurity: Secondary | ICD-10-CM

## 2024-01-28 DIAGNOSIS — F17290 Nicotine dependence, other tobacco product, uncomplicated: Secondary | ICD-10-CM | POA: Diagnosis not present

## 2024-01-28 DIAGNOSIS — N186 End stage renal disease: Secondary | ICD-10-CM | POA: Diagnosis not present

## 2024-01-28 DIAGNOSIS — Z91158 Patient's noncompliance with renal dialysis for other reason: Secondary | ICD-10-CM

## 2024-01-28 HISTORY — DX: End stage renal disease: N18.6

## 2024-01-28 HISTORY — DX: End stage renal disease: Z99.2

## 2024-01-28 HISTORY — DX: Dependence on renal dialysis: N18.6

## 2024-01-28 LAB — RESP PANEL BY RT-PCR (RSV, FLU A&B, COVID)  RVPGX2
Influenza A by PCR: NEGATIVE
Influenza B by PCR: NEGATIVE
Resp Syncytial Virus by PCR: NEGATIVE
SARS Coronavirus 2 by RT PCR: NEGATIVE

## 2024-01-28 LAB — CBC
HCT: 29.5 % — ABNORMAL LOW (ref 36.0–46.0)
Hemoglobin: 8.9 g/dL — ABNORMAL LOW (ref 12.0–15.0)
MCH: 28.3 pg (ref 26.0–34.0)
MCHC: 30.2 g/dL (ref 30.0–36.0)
MCV: 93.9 fL (ref 80.0–100.0)
Platelets: 297 10*3/uL (ref 150–400)
RBC: 3.14 MIL/uL — ABNORMAL LOW (ref 3.87–5.11)
RDW: 19.3 % — ABNORMAL HIGH (ref 11.5–15.5)
WBC: 8.7 10*3/uL (ref 4.0–10.5)
nRBC: 0 % (ref 0.0–0.2)

## 2024-01-28 LAB — BASIC METABOLIC PANEL WITH GFR
Anion gap: 17 — ABNORMAL HIGH (ref 5–15)
BUN: 38 mg/dL — ABNORMAL HIGH (ref 6–20)
CO2: 21 mmol/L — ABNORMAL LOW (ref 22–32)
Calcium: 9 mg/dL (ref 8.9–10.3)
Chloride: 97 mmol/L — ABNORMAL LOW (ref 98–111)
Creatinine, Ser: 9.82 mg/dL — ABNORMAL HIGH (ref 0.44–1.00)
GFR, Estimated: 5 mL/min — ABNORMAL LOW (ref >60)
Glucose, Bld: 72 mg/dL (ref 70–99)
Potassium: 4.9 mmol/L (ref 3.5–5.1)
Sodium: 135 mmol/L (ref 135–145)

## 2024-01-28 LAB — MRSA NEXT GEN BY PCR, NASAL: MRSA by PCR Next Gen: NOT DETECTED

## 2024-01-28 LAB — HEPATITIS B SURFACE ANTIGEN: Hepatitis B Surface Ag: NONREACTIVE

## 2024-01-28 MED ORDER — OXYCODONE HCL 5 MG PO TABS
5.0000 mg | ORAL_TABLET | ORAL | Status: DC | PRN
  Administered 2024-01-28 – 2024-01-29 (×3): 10 mg via ORAL
  Administered 2024-01-29: 5 mg via ORAL
  Administered 2024-01-30 (×2): 10 mg via ORAL
  Filled 2024-01-28 (×6): qty 2

## 2024-01-28 MED ORDER — TIZANIDINE HCL 4 MG PO TABS
4.0000 mg | ORAL_TABLET | Freq: Three times a day (TID) | ORAL | Status: DC | PRN
  Administered 2024-01-28 – 2024-01-29 (×4): 4 mg via ORAL
  Filled 2024-01-28 (×4): qty 1

## 2024-01-28 MED ORDER — FUROSEMIDE 40 MG PO TABS
160.0000 mg | ORAL_TABLET | Freq: Every day | ORAL | Status: DC
  Administered 2024-01-28 – 2024-01-30 (×3): 160 mg via ORAL
  Filled 2024-01-28 (×3): qty 4

## 2024-01-28 MED ORDER — CALCITRIOL 0.25 MCG PO CAPS
0.2500 ug | ORAL_CAPSULE | Freq: Every day | ORAL | Status: DC
  Filled 2024-01-28 (×2): qty 1

## 2024-01-28 MED ORDER — SODIUM CHLORIDE 0.9% FLUSH: 3.0000 mL | Freq: Two times a day (BID) | INTRAVENOUS | Status: DC

## 2024-01-28 MED ORDER — ACETAMINOPHEN 325 MG PO TABS
650.0000 mg | ORAL_TABLET | Freq: Once | ORAL | Status: AC
  Administered 2024-01-28: 650 mg via ORAL
  Filled 2024-01-28: qty 2

## 2024-01-28 MED ORDER — HYDRALAZINE HCL 20 MG/ML IJ SOLN: 10.0000 mg | Freq: Four times a day (QID) | INTRAMUSCULAR | Status: DC | PRN

## 2024-01-28 MED ORDER — FENTANYL CITRATE PF 50 MCG/ML IJ SOSY
50.0000 ug | PREFILLED_SYRINGE | Freq: Once | INTRAMUSCULAR | Status: AC
  Administered 2024-01-28: 50 ug via INTRAVENOUS
  Filled 2024-01-28: qty 1

## 2024-01-28 MED ORDER — TIZANIDINE HCL 4 MG PO TABS
4.0000 mg | ORAL_TABLET | Freq: Once | ORAL | Status: AC
  Administered 2024-01-28: 4 mg via ORAL
  Filled 2024-01-28: qty 1

## 2024-01-28 MED ORDER — CARVEDILOL 25 MG PO TABS
100.0000 mg | ORAL_TABLET | Freq: Every day | ORAL | Status: DC
  Administered 2024-01-29 – 2024-01-30 (×2): 100 mg via ORAL
  Filled 2024-01-28 (×2): qty 4

## 2024-01-28 MED ORDER — OXYCODONE HCL 5 MG PO TABS
5.0000 mg | ORAL_TABLET | ORAL | Status: DC | PRN
  Administered 2024-01-28: 5 mg via ORAL
  Filled 2024-01-28: qty 1

## 2024-01-28 MED ORDER — CARVEDILOL 25 MG PO TABS
100.0000 mg | ORAL_TABLET | Freq: Once | ORAL | Status: AC
  Administered 2024-01-28: 100 mg via ORAL
  Filled 2024-01-28: qty 4

## 2024-01-28 MED ORDER — CHLORHEXIDINE GLUCONATE CLOTH 2 % EX PADS: 6.0000 | MEDICATED_PAD | Freq: Every day | CUTANEOUS | Status: DC

## 2024-01-28 MED ORDER — NITROGLYCERIN 2 % TD OINT
1.0000 [in_us] | TOPICAL_OINTMENT | Freq: Once | TRANSDERMAL | Status: AC
  Administered 2024-01-28: 1 [in_us] via TOPICAL
  Filled 2024-01-28: qty 1

## 2024-01-28 MED ORDER — AMLODIPINE BESYLATE 10 MG PO TABS
10.0000 mg | ORAL_TABLET | Freq: Once | ORAL | Status: AC
  Administered 2024-01-28: 10 mg via ORAL
  Filled 2024-01-28: qty 1

## 2024-01-28 MED ORDER — FENTANYL CITRATE PF 50 MCG/ML IJ SOSY
100.0000 ug | PREFILLED_SYRINGE | Freq: Once | INTRAMUSCULAR | Status: AC
  Administered 2024-01-28: 100 ug via INTRAVENOUS
  Filled 2024-01-28: qty 2

## 2024-01-28 MED ORDER — AMLODIPINE BESYLATE 10 MG PO TABS
10.0000 mg | ORAL_TABLET | Freq: Every day | ORAL | Status: DC
  Administered 2024-01-29 – 2024-01-30 (×2): 10 mg via ORAL
  Filled 2024-01-28 (×2): qty 1

## 2024-01-28 MED ORDER — HEPARIN SODIUM (PORCINE) 5000 UNIT/ML IJ SOLN
5000.0000 [IU] | Freq: Three times a day (TID) | INTRAMUSCULAR | Status: DC
  Administered 2024-01-28: 5000 [IU] via SUBCUTANEOUS
  Filled 2024-01-28 (×2): qty 1

## 2024-01-28 NOTE — ED Notes (Signed)
 Patient placed on room air, saturation remain at 98-99%, MD made aware.

## 2024-01-28 NOTE — Plan of Care (Signed)

## 2024-01-28 NOTE — ED Notes (Signed)
 Pt satting 88% on room air. Pt placed on 2L O2

## 2024-01-28 NOTE — Plan of Care (Signed)
  Problem: Education: Goal: Knowledge of disease and its progression will improve 01/28/2024 1615 by Elvina Hammers, RN Outcome: Progressing 01/28/2024 1614 by Elvina Hammers, RN Outcome: Progressing Goal: Individualized Educational Video(s) 01/28/2024 1615 by Elvina Hammers, RN Outcome: Progressing 01/28/2024 1614 by Elvina Hammers, RN Outcome: Progressing   Problem: Fluid Volume: Goal: Compliance with measures to maintain balanced fluid volume will improve 01/28/2024 1615 by Elvina Hammers, RN Outcome: Progressing 01/28/2024 1614 by Elvina Hammers, RN Outcome: Progressing   Problem: Health Behavior/Discharge Planning: Goal: Ability to manage health-related needs will improve 01/28/2024 1615 by Elvina Hammers, RN Outcome: Progressing 01/28/2024 1614 by Elvina Hammers, RN Outcome: Progressing   Problem: Nutritional: Goal: Ability to make healthy dietary choices will improve 01/28/2024 1615 by Elvina Hammers, RN Outcome: Progressing 01/28/2024 1614 by Elvina Hammers, RN Outcome: Progressing   Problem: Clinical Measurements: Goal: Complications related to the disease process, condition or treatment will be avoided or minimized 01/28/2024 1615 by Elvina Hammers, RN Outcome: Progressing 01/28/2024 1614 by Elvina Hammers, RN Outcome: Progressing

## 2024-01-28 NOTE — ED Triage Notes (Addendum)
 Pt BIB [facility] from home for [facility] x2 days. Pt states that this worsens when she lays flat. Pt also states that her clitoris has been hard and painful x2 days.  H/x calciphylaxis. Pt states that sores are now leaking. Pt reports abscess like sores on arms and on buttocks. Dialysis MWF. Has not missed any treatments.  170/90 HR 95

## 2024-01-28 NOTE — ED Provider Notes (Addendum)
 Patient signed out to me at 0700 by Dr. Wallis Gun pending dialysis and reassessment.  In short this is a 28 year old female with a past medical history of SLE, hypertension, ESRD on HD that presented to the emergency department with shortness of breath.  She is having difficulty getting to her dialysis center did appear acutely volume overloaded.  She was also complaining of pain related to her body wounds from her calciphylaxis.  Dr. Wallis Gun spoke with nephrology who will be able to dialyze her today.  The patient will be recommended outpatient management for her chronic wounds.  On my evaluation she is awake and alert in no acute distress, blood pressure is improving and she has no acute complaints at this time.  2:28 PM Patient returned from HD and tolerated this well, and improvement of her shortness of breath and is breathing comfortably, lungs are clear.  Patient stable for discharge home.  She was requesting prescription pain medication for chronic pain but I defer this to her primary doctor.  Patient was given strict return precautions.   2:52 PM RN went to discharge patient and rechecked vitals, now satting 86% on room air. With continued hypoxia after dialysis will be recommended admission.   Kingsley, Demyah Smyre K, DO 01/28/24 1453

## 2024-01-28 NOTE — Discharge Instructions (Addendum)
 You were seen in the emergency department for your shortness of breath.  You did have fluid buildup and we are able to dialyze you today with improvement.  You can follow-up with your normal dialysis as scheduled.  You can follow-up with your primary doctor regarding your calciphylaxis and chronic pain and for pain management.  You should return to the emergency department feeling significantly worsening shortness of breath, severe chest pain, if you pass out or if you have any other new or concerning symptoms.Linda [name]

## 2024-01-28 NOTE — TOC CM/SW Note (Signed)
 Transition of Care [facility]) - Inpatient Brief Assessment   Patient Details  Name: Linda [name] MRN: [id number] Date of Birth: 1996/02/11  Transition of Care [facility]) CM/SW Contact:    Juliane Och, LCSW Phone Number: 01/28/2024, 4:12 PM   Clinical Narrative:  4:12 PM Per chart review, patient resides at home with relatives. Patient has a PCP and insurance. Patient does not have SNF/HH/DME history. CSW provided SDOH (transportation) resources. No other TOC needs were identified at this time. TOC will continue to follow and be available to assist.  Transition of Care Asessment: Insurance and Status: Insurance coverage has been reviewed Patient has primary care physician: Yes Home environment has been reviewed: Private Residence Prior level of function:: N/A Prior/Current Home Services: No current home services Social Drivers of Health Review: SDOH reviewed interventions complete Readmission risk has been reviewed: Yes Transition of care needs: no transition of care needs at this time

## 2024-01-28 NOTE — ED Provider Notes (Signed)
  EMERGENCY DEPARTMENT AT [facility] Provider Note   CSN: [id number] Arrival date & time: 01/28/24  4098     History  Chief Complaint  Patient presents with   Shortness of Breath    Linda [name] is a 28 y.o. female.  The history is provided by the patient.  Patient history of SLE, hypertension, end-stage renal disease on dialysis presents with shortness of breath.  Patient reports for the past 2 days she has had increasing shortness of breath and orthopnea.  She reports chest tightness, cough with sputum production but no hemoptysis.  No fevers. She reports continued pain throughout her body from wounds likely related to underlying calciphylaxis She reports she goes to dialysis Monday Wednesday Friday and no missed sessions  She is not on home oxygen Past Medical History:  Diagnosis Date   Abnormal ECG    a. In setting of LVH w/ repolarization abnormalities; b. 03/2019 MV: No ischemia/infarct. EF 55-65%.   Angioedema    ESRD (end stage renal disease) (HCC)    on HD (M,W,F)   GERD (gastroesophageal reflux disease)    Hypertension    Lupus    LVH (left ventricular hypertrophy)    a. 11/2017 Echo: EF 50-55%. triv AI. Mild MR. Mod L and mild R atrial enlargement. Nl RV size/fxn. Small pericardial effusion w/o tampondae; b. 03/2018 Echo: EF 55-60%, restrictive filling pattern. Nl RV size/fxn. Sev LAE. Mild MR, mild to mod TR/PR. Mod PAH. Triv effusion.   Migraines    Mixed connective tissue disease (HCC)    Previous Dialysis patient [facility])    a. Off HD since late 2020.   Septic prepatellar bursitis of right knee 08/19/2017    Home Medications Prior to Admission medications   Medication Sig Start Date End Date Taking? Authorizing Provider  amLODipine  (NORVASC ) 10 MG tablet Take 10 mg by mouth daily. 08/05/21   [provider]  calcitRIOL  (ROCALTROL ) 0.25 MCG capsule Take 1 tablet by mouth every Monday, Wednesday, and Friday with hemodialysis.     [provider]  carvedilol  (COREG ) 25 MG tablet Take 1 tablet (25 mg total) by mouth 2 (two) times daily with a meal. Patient taking differently: Take 100 mg by mouth daily. 09/03/23   Samtani, Jai-Gurmukh, MD  furosemide  (LASIX ) 80 MG tablet Take 1 tablet (80 mg total) by mouth daily. Patient taking differently: Take 160 mg by mouth daily. 12/23/23   Deforest Fast, MD  lidocaine -prilocaine  (EMLA ) cream Apply 1 Application topically once. 01/14/24   [provider]  medroxyPROGESTERone  (DEPO-PROVERA ) 150 MG/ML injection Inject 1 mL (150 mg total) into the muscle every 3 (three) months. 10/29/22   Gabrielle Joiner, MD  naloxone [facility]) nasal spray 4 mg/0.1 mL Place 1 spray into the nose once. If needed for emergency overdose.    [provider]  nitroGLYCERIN  (NITROSTAT ) 0.4 MG SL tablet Place 1 tablet (0.4 mg total) under the tongue every 5 (five) minutes as needed for chest pain. 10/28/23   Rai, Hurman Maiden, MD  ondansetron  (ZOFRAN -ODT) 4 MG disintegrating tablet Take 1 tablet (4 mg total) by mouth every 8 (eight) hours as needed for nausea or vomiting. 10/28/23   Rai, Ripudeep K, MD  senna-docusate (SENOKOT-S) 8.6-50 MG tablet Take 1 tablet by mouth 2 (two) times daily between meals as needed for mild constipation. 01/14/24   Gonfa, Taye T, MD  tiZANidine  (ZANAFLEX ) 4 MG tablet Take 4 mg by mouth every 8 (eight) hours as needed for muscle  spasms. 12/03/23   [provider]      Allergies    Iodine , Lisinopril , Gabapentin, Hydroxychloroquine , and Vicodin [hydrocodone -acetaminophen ]    Review of Systems   Review of Systems  Respiratory:  Positive for cough and shortness of breath.   Cardiovascular:  Positive for chest pain.    Physical Exam Updated Vital Signs BP (!) 160/136   Pulse 99   Temp 98.9 F (37.2 C)   Resp 20   Ht 1.676 m (5\' 6" )   Wt 76.7 kg   LMP  (LMP Unknown) Comment: injection  SpO2 100%   BMI 27.29 kg/m  Physical Exam CONSTITUTIONAL:  Chronically ill-appearing HEAD: Normocephalic/atraumatic EYES: EOMI/PERRL ENMT: Mucous membranes moist NECK: supple no meningeal signs CV: S1/S2 noted, tachycardic LUNGS: Currently on nasal cannula, overall clear lung sounds, mild tachypnea ABDOMEN: soft, nontender GU - patient declines GU exam NEURO: Pt is awake/alert/appropriate, moves all extremitiesx4.  No facial droop.   EXTREMITIES: pulses normal/equal, full ROM Dialysis access to left arm, thrill noted SKIN: warm, color normal Multiple lesions throughout the body likely related to underlying calciphylaxis, no crepitus, no fluctuance.  Wound noted near right buttock, chaperone present for exam  ED Results / Procedures / Treatments   Labs (all labs ordered are listed, but only abnormal results are displayed) Labs Reviewed  CBC - Abnormal; Notable for the following components:      Result Value   RBC 3.14 (*)    Hemoglobin 8.9 (*)    HCT 29.5 (*)    RDW 19.3 (*)    All other components within normal limits  BASIC METABOLIC PANEL WITH GFR - Abnormal; Notable for the following components:   Chloride 97 (*)    CO2 21 (*)    BUN 38 (*)    Creatinine, Ser 9.82 (*)    GFR, Estimated 5 (*)    Anion gap 17 (*)    All other components within normal limits  HEPATITIS B SURFACE ANTIGEN  HEPATITIS B SURFACE ANTIBODY, QUANTITATIVE    EKG EKG Interpretation Date/Time:  Friday Jan 28 2024 00:40:42 EDT Ventricular Rate:  102 PR Interval:  166 QRS Duration:  86 QT Interval:  370 QTC Calculation: 482 R Axis:   86  Text Interpretation: Sinus tachycardia Biatrial enlargement Left ventricular hypertrophy ( [place]hilt-Estes ) Cannot rule out Septal infarct , age undetermined Abnormal ECG When compared with ECG of 11-Jan-2024 03:57, PREVIOUS ECG IS PRESENT Confirmed by Eldon Greenland ([id number]) on 01/28/2024 1:20:17 AM  Radiology DG Chest Portable 1 View Result Date: 01/28/2024 CLINICAL DATA:  Shortness of breath. EXAM: PORTABLE CHEST 1  VIEW COMPARISON:  Jan 11, 2024 FINDINGS: The cardiac silhouette is enlarged and unchanged in size. Prominence of the central pulmonary vasculature is seen. Mildly increased interstitial lung markings are also noted. No pleural effusion or pneumothorax is identified. The visualized skeletal structures are unremarkable. IMPRESSION: Cardiomegaly and pulmonary vascular congestion with mild pulmonary edema. Electronically Signed   By: Virgle Grime M.D.   On: 01/28/2024 02:13    Procedures .Critical Care  Performed by: Eldon Greenland, MD Authorized by: Eldon Greenland, MD   Critical care provider statement:    Critical care time (minutes):  45   Critical care start time:  01/28/2024 2:05 AM   Critical care end time:  01/28/2024 2:45 AM   Critical care time was exclusive of:  Separately billable procedures and treating other patients   Critical care was necessary to treat or prevent imminent or life-threatening deterioration of the  following conditions:  Renal failure and respiratory failure   Critical care was time spent personally by me on the following activities:  Obtaining history from patient or surrogate, examination of patient, development of treatment plan with patient or surrogate, pulse oximetry, ordering and review of radiographic studies, ordering and review of laboratory studies, ordering and performing treatments and interventions, re-evaluation of patient's condition, review of old charts and evaluation of patient's response to treatment   I assumed direction of critical care for this patient from another provider in my specialty: no       Medications Ordered in ED Medications  fentaNYL  (SUBLIMAZE ) injection 100 mcg (100 mcg Intravenous Given 01/28/24 0232)  fentaNYL  (SUBLIMAZE ) injection 50 mcg (50 mcg Intravenous Given 01/28/24 0616)  nitroGLYCERIN  (NITROGLYN) 2 % ointment 1 inch (1 inch Topical Given 01/28/24 0616)    ED Course/ Medical Decision Making/ A&P Clinical Course  as of 01/28/24 9562  Fri Jan 28, 2024  0205 Patient w/multiple medical conditions including ESRD, lupus presents with increasing shortness of breath.  She does appear to have a new oxygen requirement. Denies missed dialysis sessions.  Workup pending at this time [DW]  0611 Patient is overall stable.  No hyperkalemia.  Chronic anemia noted.  Mild edema on x-ray, but she is in no acute distress We will treat her hypertension and pain. She has difficulty getting dialysis, will arrange here in the ER and likely discharge Discussed with Dr. Jearldine Mina for nephrology who will arrange dialysis  Patient reports lesions all over her body and is requesting general surgery evaluation.  However this has been ordered as an outpatient and there is no acute findings at this time  [DW]  [phone] Signed out to dr Nora Beal -pt awaiting dialysis and then likely d/c home [DW]    Clinical Course User Index [DW] Eldon Greenland, MD                                 Medical Decision Making Amount and/or Complexity of Data Reviewed Labs: ordered. Radiology: ordered.  Risk Prescription drug management.   This patient presents to the ED for concern of shortness of breath, this involves an extensive number of treatment options, and is a complaint that carries with it a high risk of complications and morbidity.  The differential diagnosis includes but is not limited to Acute coronary syndrome, pneumonia, acute pulmonary edema, pneumothorax, acute anemia, pulmonary embolism    Comorbidities that complicate the patient evaluation: Patient's presentation is complicated by their history of end stage renal disease, lupus  Social Determinants of Health: Patient's multiple ER visits  increases the complexity of managing their presentation  Additional history obtained: Records reviewed previous admission documents  Lab Tests: I Ordered, and personally interpreted labs.  The pertinent results include: Renal  failure  Imaging Studies ordered: I ordered imaging studies including X-ray chest  I independently visualized and interpreted imaging which showed mild pulmonary edema I agree with the radiologist interpretation  Cardiac Monitoring: The patient was maintained on a cardiac monitor.  I personally viewed and interpreted the cardiac monitor which showed an underlying rhythm of:  sinus rhythm  Medicines ordered and prescription drug management: I ordered medication including fentanyl  for pain Reevaluation of the patient after these medicines showed that the patient    improved    Critical Interventions:   blood pressure management, arrange dialysis  Consultations Obtained: I requested consultation with the consultant nephrology, and  discussed  findings as well as pertinent plan - they recommend: Will arrange dialysis  Reevaluation: After the interventions noted above, I reevaluated the patient and found that they have :improved  Complexity of problems addressed: Patient's presentation is most consistent with  acute presentation with potential threat to life or bodily function   Patient with frequent ER visits with multiple underlying comorbidities.  Patient came tonight because she felt short of breath, had orthopnea and does not think she can wait for dialysis.  She is not in respiratory failure as she is now on room air, and no hyperkalemia.  However she does report limitations with transportation to dialysis, this has been arranged       Final Clinical Impression(s) / ED Diagnoses Final diagnoses:  ESRD (end stage renal disease) (HCC)  Acute pulmonary edema (HCC)    Rx / DC Orders ED Discharge Orders     None         Eldon Greenland, MD 01/28/24 0700

## 2024-01-28 NOTE — ED Notes (Signed)
 Patient was placed on 02 @ 3 liters/[place] states increased to 96%

## 2024-01-28 NOTE — H&P (Signed)
 History and Physical    Patient: Linda [name] TFT:[id number] DOB: 1995-11-20 DOA: 01/28/2024 DOS: the patient was seen and examined on 01/28/2024 PCP: Francenia Ingle, NP  Patient coming from: Home  Chief Complaint:  Chief Complaint  Patient presents with   Shortness of Breath   HPI: Linda [name] is a 28 y.o. female with medical history significant of SLE, hypertension, chronic kidney disease stage V on hemodialysis Monday Wednesday Friday, and calciphylaxis with wounds on the extremities.  Patient presented to the ED overnight on 5/30 complaining of shortness of breath, dyspnea on exertion and orthopnea.  Denies missing dialysis treatments.  Chest x-ray demonstrating bilateral pulmonary edema.  Appeared otherwise volume overloaded per EDP report.  Nephrology consulted and patient went for emergent dialysis from the ED.  Return to the ED after 3 L removed but remained hypoxic with sats 86% room air.  Hospitalist service has been consulted to evaluate the patient for admission.  She admitted that prior to previous admission that she had missed dialysis in order to attend a festival.  Because of how sick she became with that it frightened her and she has been adamant about attending dialysis appointments as scheduled.  Last week she left 1 hour early because she had another physician appointment she needed to attend to.  She believes she is having a lupus flare noting she has red dots on the palm of her hands.  She typically utilizes a prednisone  taper.  She is set to see a rheumatologist in the next month.  In regards to her respiratory symptoms she does not think she has sick contacts but she was noted to have a low-grade fever of 99.2 while in the ED.  She has been having issues with pain in her perineum with some swelling but no drainage for about 2 weeks.  This area is so tender that it causes immense pain with attempts to wipe after voiding.  Patient is unclear as to  whether this started with bumps on her perineum.  She thinks she may have had herpes in the past noting she was familiar with the medication Valtrex  when questioned.  States last sexual activity 2 months prior.  She states she has an upcoming appointment with a gynecologist.  Review of Systems: As mentioned in the history of present illness. All other systems reviewed and are negative.  Past Medical History:  Diagnosis Date   Abnormal ECG    a. In setting of LVH w/ repolarization abnormalities; b. 03/2019 MV: No ischemia/infarct. EF 55-65%.   Angioedema    ESRD (end stage renal disease) (HCC)    on HD (M,W,F)   GERD (gastroesophageal reflux disease)    Hypertension    Lupus    LVH (left ventricular hypertrophy)    a. 11/2017 Echo: EF 50-55%. triv AI. Mild MR. Mod L and mild R atrial enlargement. Nl RV size/fxn. Small pericardial effusion w/o tampondae; b. 03/2018 Echo: EF 55-60%, restrictive filling pattern. Nl RV size/fxn. Sev LAE. Mild MR, mild to mod TR/PR. Mod PAH. Triv effusion.   Migraines    Mixed connective tissue disease (HCC)    Previous Dialysis patient [facility])    a. Off HD since late 2020.   Septic prepatellar bursitis of right knee 08/19/2017   Past Surgical History:  Procedure Laterality Date   GRAFT APPLICATION Left 03/30/2019   Procedure: BRACHIAL CEPHALIC GRAFT CREATION;  Surgeon: Celso College, MD;  Location: [facility] ORS;  Service: Vascular;  Laterality: Left;  I & D EXTREMITY Right 08/06/2017   Procedure: IRRIGATION AND DEBRIDEMENT KNEE;  Surgeon: Laneta Pintos, MD;  Location: [facility] OR;  Service: Orthopedics;  Laterality: Right;   IR THROMBECTOMY AV FISTULA W/THROMBOLYSIS/PTA/STENT INC/SHUNT/IMG LT Left 05/18/2022   IR US  GUIDE VASC ACCESS LEFT  05/18/2022   MULTIPLE TOOTH EXTRACTIONS     Social History:  reports that she has been smoking e-cigarettes and cigarettes. She started smoking about 8 years ago. She has a 2.2 pack-year smoking history. She has never used smokeless  tobacco. She reports that she does not drink alcohol and does not use drugs.  Allergies  Allergen Reactions   Iodine  Hives and Shortness Of Breath   Lisinopril  Anaphylaxis    Angioedema   Gabapentin Other (See Comments)    Reaction: Tremor (intolerance); tremor   Hydroxychloroquine  Other (See Comments)    Pt reports making her skin peel really bad   Vicodin [Hydrocodone -Acetaminophen ] Itching and Rash    Family History  Problem Relation Age of Onset   Lupus Mother    Hypertension Mother    Kidney disease Mother    Heart Problems Mother    Coronary artery disease Mother 55       stent   Diabetes Maternal Grandmother     Prior to Admission medications   Medication Sig Start Date End Date Taking? Authorizing Provider  amLODipine  (NORVASC ) 10 MG tablet Take 10 mg by mouth daily. 08/05/21   [provider]  calcitRIOL  (ROCALTROL ) 0.25 MCG capsule Take 1 tablet by mouth every Monday, Wednesday, and Friday with hemodialysis.    [provider]  carvedilol  (COREG ) 25 MG tablet Take 1 tablet (25 mg total) by mouth 2 (two) times daily with a meal. Patient taking differently: Take 100 mg by mouth daily. 09/03/23   Samtani, Jai-Gurmukh, MD  furosemide  (LASIX ) 80 MG tablet Take 1 tablet (80 mg total) by mouth daily. Patient taking differently: Take 160 mg by mouth daily. 12/23/23   Deforest Fast, MD  lidocaine -prilocaine  (EMLA ) cream Apply 1 Application topically once. 01/14/24   [provider]  medroxyPROGESTERone  (DEPO-PROVERA ) 150 MG/ML injection Inject 1 mL (150 mg total) into the muscle every 3 (three) months. 10/29/22   Gabrielle Joiner, MD  naloxone [facility]) nasal spray 4 mg/0.1 mL Place 1 spray into the nose once. If needed for emergency overdose.    [provider]  nitroGLYCERIN  (NITROSTAT ) 0.4 MG SL tablet Place 1 tablet (0.4 mg total) under the tongue every 5 (five) minutes as needed for chest pain. 10/28/23   Rai, Hurman Maiden, MD  ondansetron   (ZOFRAN -ODT) 4 MG disintegrating tablet Take 1 tablet (4 mg total) by mouth every 8 (eight) hours as needed for nausea or vomiting. 10/28/23   Rai, Ripudeep K, MD  senna-docusate (SENOKOT-S) 8.6-50 MG tablet Take 1 tablet by mouth 2 (two) times daily between meals as needed for mild constipation. 01/14/24   Gonfa, Taye T, MD  tiZANidine  (ZANAFLEX ) 4 MG tablet Take 4 mg by mouth every 8 (eight) hours as needed for muscle spasms. 12/03/23   [provider]    Physical Exam: Vitals:   01/28/24 1445 01/28/24 1511 01/28/24 1556 01/28/24 1600  BP: (!) 179/131 (!) 173/124  (!) 180/130  Pulse: (!) 103 (!) 102  (!) 101  Resp: (!) 22 20  20   Temp:  98.2 F (36.8 C)  98.5 F (36.9 C)  TempSrc:  Oral  Oral  SpO2: (!) 87% 100%  100%  Weight:   74.1  kg   Height:   5\' 6"  (1.676 m)    Constitutional: NAD, calm, comfortable Respiratory: clear to auscultation bilaterally, no wheezing, no crackles. Normal respiratory effort. No accessory muscle use.  Cardiovascular: Regular rate and rhythm, no murmurs / rubs / gallops. No extremity edema. 2+ pedal pulses. No carotid bruits.  Abdomen: no tenderness, no masses palpated. No hepatosplenomegaly. Bowel sounds positive.  Musculoskeletal: no clubbing / cyanosis. No joint deformity upper and lower extremities. Good ROM, no contractures. Normal muscle tone.  Skin: no rashes, lesions, ulcers. No induration Neurologic: CN 2-12 grossly intact. Sensation intact, DTR normal. Strength 5/5 x all 4 extremities.  Psychiatric: Normal judgment and insight. Alert and oriented x 3. Normal mood.    Data Reviewed:  Sodium 135, potassium 4.9, chloride 97, CO2 21, BUN 72, creatinine 9.82, anion gap 17  WBC 8700, hemoglobin 8.9, platelets 297,000  Chest x-ray as documented above  Assessment and Plan: Acute hypoxemic respiratory failure secondary to volume overload/pulmonary edema CKD 5 on dialysis Presented with symptomatic volume overload as evidenced by shortness of  breath, dyspnea on exertion and orthopnea.  Had 3 L removed during emergent hemodialysis but remained hypoxic with room air sats 86% Repeat chest x-ray post hemodialysis Place in observation Continue O2 and wean as tolerated Nephrology notified of need for admission and likely need for repeat dialysis in a.m. 5/31 List states patient takes 160 mg of Lasix  daily so we will resume and first dose now Intake and output with daily weights Patient did have low-grade fever so as a precaution we will check PCR for COVID, RSV and influenza.  Hypertension BP remains elevated despite hemodialysis noting current blood pressure reading 173/124 not verified but previous readings in same category Lasix  as above Resume home medications  SLE Not on pharmacotherapy prior to admission Per patient she has red dots on the palms of her hand which were confirmed with exam.  She states this is typical for lupus exacerbation. Unfortunately due to concerns of possible genital herpes outbreak we cannot give prednisone  at this time.  Perineal pain and possible genital herpes outbreak Will need to examine area during PERI care If she has any lesions that could represent herpetic outbreak we will need to obtain viral PCR swabs  and sent to the lab Oxycodone  for pain  Peripheral calciphylaxis Wound care as per home regimen For completion of evaluation we will ask wound care nurse to evaluate wounds for treatment recommendation    Advance Care Planning:   Code Status: Full Code   VTE prophylaxis: Subcutaneous heparin   Consults: Nephrology  Family Communication: Patient only  Severity of Illness: The appropriate patient status for this patient is OBSERVATION. Observation status is judged to be reasonable and necessary in order to provide the required intensity of service to ensure the patient's safety. The patient's presenting symptoms, physical exam findings, and initial radiographic and laboratory data in the  context of their medical condition is felt to place them at decreased risk for further clinical deterioration. Furthermore, it is anticipated that the patient will be medically stable for discharge from the hospital within 2 midnights of admission.   Author: Kathye Parkin, NP 01/28/2024 5:01 PM  For on call review [email or web address].

## 2024-01-28 NOTE — Procedures (Signed)
 Asked to see this patient for hospital dialysis. Pt presented w/ SOB. The plan will be for "ED HD". Pt is not to be admitted at this time. Pt will go to the dialysis unit when they are ready for the patient. When dialysis is completed pt will be sent back to ED for reassessment.   Pt seen in HD unit. Tolerating HD well.    OP HD: AF MWF From mid May 2025 -> 3.5h  B350   71.5kg  AVG  Hep none   I was present at the procedure, reviewed the HD regimen and made appropriate changes.   [name] MD  [facility] 01/28/2024, 11:25 AM

## 2024-01-28 NOTE — Progress Notes (Signed)
 Contacted by nephrologist with request that pt receive extra HD treatment out-pt tomorrow at clinic. Contacted [facility] to inquire about appt for tomorrow. Clinic can treat pt tomorrow. Pt will need to arrive at 7:00 am for 7:20 am chair time. Spoke to pt via phone who states she will not have transportation to appt tomorrow. Nephrologist made aware of this info and contacted clinic to make them aware that pt will not be able to make appt tomorrow.   Lauraine Polite Renal Navigator [phone]

## 2024-01-29 ENCOUNTER — Encounter (HOSPITAL_COMMUNITY): Payer: Self-pay | Admitting: Internal Medicine

## 2024-01-29 DIAGNOSIS — Z91041 Radiographic dye allergy status: Secondary | ICD-10-CM | POA: Diagnosis not present

## 2024-01-29 DIAGNOSIS — J9601 Acute respiratory failure with hypoxia: Secondary | ICD-10-CM

## 2024-01-29 DIAGNOSIS — R0602 Shortness of breath: Secondary | ICD-10-CM | POA: Diagnosis not present

## 2024-01-29 DIAGNOSIS — M3210 Systemic lupus erythematosus, organ or system involvement unspecified: Secondary | ICD-10-CM | POA: Diagnosis not present

## 2024-01-29 DIAGNOSIS — K219 Gastro-esophageal reflux disease without esophagitis: Secondary | ICD-10-CM | POA: Diagnosis present

## 2024-01-29 DIAGNOSIS — I10 Essential (primary) hypertension: Secondary | ICD-10-CM | POA: Diagnosis not present

## 2024-01-29 DIAGNOSIS — F17290 Nicotine dependence, other tobacco product, uncomplicated: Secondary | ICD-10-CM | POA: Diagnosis present

## 2024-01-29 DIAGNOSIS — I120 Hypertensive chronic kidney disease with stage 5 chronic kidney disease or end stage renal disease: Secondary | ICD-10-CM | POA: Diagnosis not present

## 2024-01-29 DIAGNOSIS — N2581 Secondary hyperparathyroidism of renal origin: Secondary | ICD-10-CM | POA: Diagnosis not present

## 2024-01-29 DIAGNOSIS — G8929 Other chronic pain: Secondary | ICD-10-CM | POA: Diagnosis present

## 2024-01-29 DIAGNOSIS — Z885 Allergy status to narcotic agent status: Secondary | ICD-10-CM | POA: Diagnosis not present

## 2024-01-29 DIAGNOSIS — Z8269 Family history of other diseases of the musculoskeletal system and connective tissue: Secondary | ICD-10-CM | POA: Diagnosis not present

## 2024-01-29 DIAGNOSIS — E8770 Fluid overload, unspecified: Secondary | ICD-10-CM | POA: Diagnosis present

## 2024-01-29 DIAGNOSIS — D631 Anemia in chronic kidney disease: Secondary | ICD-10-CM | POA: Diagnosis not present

## 2024-01-29 DIAGNOSIS — Z992 Dependence on renal dialysis: Secondary | ICD-10-CM | POA: Diagnosis not present

## 2024-01-29 DIAGNOSIS — G43909 Migraine, unspecified, not intractable, without status migrainosus: Secondary | ICD-10-CM | POA: Diagnosis present

## 2024-01-29 DIAGNOSIS — Z888 Allergy status to other drugs, medicaments and biological substances status: Secondary | ICD-10-CM | POA: Diagnosis not present

## 2024-01-29 DIAGNOSIS — Z833 Family history of diabetes mellitus: Secondary | ICD-10-CM | POA: Diagnosis not present

## 2024-01-29 DIAGNOSIS — Z79899 Other long term (current) drug therapy: Secondary | ICD-10-CM | POA: Diagnosis not present

## 2024-01-29 DIAGNOSIS — Z841 Family history of disorders of kidney and ureter: Secondary | ICD-10-CM | POA: Diagnosis not present

## 2024-01-29 DIAGNOSIS — E8779 Other fluid overload: Secondary | ICD-10-CM | POA: Diagnosis not present

## 2024-01-29 DIAGNOSIS — J810 Acute pulmonary edema: Secondary | ICD-10-CM | POA: Diagnosis not present

## 2024-01-29 DIAGNOSIS — M351 Other overlap syndromes: Secondary | ICD-10-CM | POA: Diagnosis present

## 2024-01-29 DIAGNOSIS — Z8249 Family history of ischemic heart disease and other diseases of the circulatory system: Secondary | ICD-10-CM | POA: Diagnosis not present

## 2024-01-29 DIAGNOSIS — M329 Systemic lupus erythematosus, unspecified: Secondary | ICD-10-CM | POA: Diagnosis present

## 2024-01-29 DIAGNOSIS — N186 End stage renal disease: Secondary | ICD-10-CM | POA: Diagnosis not present

## 2024-01-29 DIAGNOSIS — Z1152 Encounter for screening for COVID-19: Secondary | ICD-10-CM | POA: Diagnosis not present

## 2024-01-29 LAB — RENAL FUNCTION PANEL
Albumin: 2.7 g/dL — ABNORMAL LOW (ref 3.5–5.0)
Anion gap: 13 (ref 5–15)
BUN: 28 mg/dL — ABNORMAL HIGH (ref 6–20)
CO2: 25 mmol/L (ref 22–32)
Calcium: 8.6 mg/dL — ABNORMAL LOW (ref 8.9–10.3)
Chloride: 94 mmol/L — ABNORMAL LOW (ref 98–111)
Creatinine, Ser: 8.16 mg/dL — ABNORMAL HIGH (ref 0.44–1.00)
GFR, Estimated: 6 mL/min — ABNORMAL LOW (ref >60)
Glucose, Bld: 98 mg/dL (ref 70–99)
Phosphorus: 6.5 mg/dL — ABNORMAL HIGH (ref 2.5–4.6)
Potassium: 4.1 mmol/L (ref 3.5–5.1)
Sodium: 132 mmol/L — ABNORMAL LOW (ref 135–145)

## 2024-01-29 LAB — HEPATITIS B SURFACE ANTIBODY, QUANTITATIVE: Hep B S AB Quant (Post): 4.5 m[IU]/mL — ABNORMAL LOW

## 2024-01-29 LAB — HIV ANTIBODY (ROUTINE TESTING W REFLEX): HIV Screen 4th Generation wRfx: NONREACTIVE

## 2024-01-29 LAB — RPR: RPR Ser Ql: NONREACTIVE

## 2024-01-29 MED ORDER — LIDOCAINE-PRILOCAINE 2.5-2.5 % EX CREA: 1.0000 | TOPICAL_CREAM | CUTANEOUS | Status: DC | PRN

## 2024-01-29 MED ORDER — HEPARIN SODIUM (PORCINE) 1000 UNIT/ML DIALYSIS: 1000.0000 [IU] | INTRAMUSCULAR | Status: DC | PRN

## 2024-01-29 MED ORDER — PENTAFLUOROPROP-TETRAFLUOROETH EX AERO: 1.0000 | INHALATION_SPRAY | CUTANEOUS | Status: DC | PRN

## 2024-01-29 MED ORDER — ANTICOAGULANT SODIUM CITRATE 4% (200MG/5ML) IV SOLN: 5.0000 mL | Status: DC | PRN

## 2024-01-29 MED ORDER — ALTEPLASE 2 MG IJ SOLR: 2.0000 mg | Freq: Once | INTRAMUSCULAR | Status: DC | PRN

## 2024-01-29 MED ORDER — HYDRALAZINE HCL 25 MG PO TABS: 25.0000 mg | ORAL_TABLET | Freq: Four times a day (QID) | ORAL | Status: DC | PRN

## 2024-01-29 NOTE — Progress Notes (Signed)
 [facility]                                                                               Linda [name], is a 28 y.o. female, DOB - 1996/08/09, ONG:[id number] Admit date - 01/28/2024    Outpatient Primary MD for the patient is Francenia Ingle, NP  LOS - 0  days    Brief summary    28 y.o. female with history significant of SLE not on any treatment, hypertension, ESRD on HD Monday Wednesday Friday, and calciphylaxis with wounds on the extremities, presents with acute respiratory failure with hypoxia secondary to fluid overload from  missed HD sessions.    Assessment & Plan    Assessment and Plan:  Acute hypoxic respiratory failure sec to pulm edema In the setting of ESRD on HD From missing HD Nephrology onboard.  Repeat HD today.  Weaned off oxygen     Hypertension Suboptimally controlled.  Resume lasix . Volume management with HD.    Mixed connective tissue disorder - recommend outpatient follow up Rheumatology.     Calciphylaxis Wound care .   Anemia of chronic disease From ESRD.     Estimated body mass index is 23.48 kg/m as calculated from the following:   Height as of this encounter: 5\' 6"  (1.676 m).   Weight as of this encounter: 66 kg.  Code Status: full code.  DVT Prophylaxis:  heparin  injection 5,000 Units Start: 01/28/24 1600   Level of Care: Level of care: Med-Surg Family Communication: none at bedside.   Disposition Plan:     Remains inpatient appropriate:  possible d/c in am.   Procedures:  HD sessions.   Consultants:   Nephrology.   Antimicrobials:   Anti-infectives (From admission, onward)    None        Medications  Scheduled Meds:  amLODipine   10 mg Oral Daily   calcitRIOL   0.25 mcg Oral Daily   carvedilol   100 mg Oral Daily   Chlorhexidine  Gluconate Cloth  6 each Topical Q0600   furosemide   160 mg Oral Daily   heparin   5,000 Units Subcutaneous Q8H   sodium chloride  flush  3 mL Intravenous Q12H    Continuous Infusions: PRN Meds:.hydrALAZINE , hydrALAZINE , oxyCODONE , tiZANidine     Subjective:   [name] was seen and examined today.  Pain better controlled. Weaned off oxygen.   Objective:   Vitals:   01/29/24 1744 01/29/24 1745 01/29/24 1810 01/29/24 1818  BP: (!) 139/94 (!) 140/93  (!) 131/101  Pulse: 80 72  73  Resp: 14 12  18   Temp:  98 F (36.7 C)  98.4 F (36.9 C)  TempSrc:  Oral  Oral  SpO2: 97% 97%  98%  Weight:  69 kg 66 kg   Height:        Intake/Output Summary (Last 24 hours) at 01/29/2024 1906 Last data filed at 01/29/2024 1745 Gross per 24 hour  Intake 520 ml  Output 3 ml  Net 517 ml   Filed Weights   01/29/24 1405 01/29/24 1745 01/29/24 1810  Weight: 72 kg 69 kg 66 kg     Exam General: Alert and oriented x 3, NAD Cardiovascular:  S1 S2 auscultated, no murmurs, RRR Respiratory: Clear to auscultation bilaterally, no wheezing, rales or rhonchi Gastrointestinal: Soft, nontender, nondistended, + bowel sounds Ext: no pedal edema bilaterally Neuro: AAOx3, Cr N's II- XII. Strength 5/5 upper and lower extremities bilaterally Skin: No rashes Psych: Normal affect and demeanor, alert and oriented x3    Data Reviewed:  I have personally reviewed following labs and imaging studies   CBC Lab Results  Component Value Date   WBC 8.7 01/28/2024   RBC 3.14 (L) 01/28/2024   HGB 8.9 (L) 01/28/2024   HCT 29.5 (L) 01/28/2024   MCV 93.9 01/28/2024   MCH 28.3 01/28/2024   PLT 297 01/28/2024   MCHC 30.2 01/28/2024   RDW 19.3 (H) 01/28/2024   LYMPHSABS 2.9 01/11/2024   MONOABS 0.8 01/11/2024   EOSABS 0.4 01/11/2024   BASOSABS 0.0 01/11/2024     Last metabolic panel Lab Results  Component Value Date   NA 132 (L) 01/29/2024   K 4.1 01/29/2024   CL 94 (L) 01/29/2024   CO2 25 01/29/2024   BUN 28 (H) 01/29/2024   CREATININE 8.16 (H) 01/29/2024   GLUCOSE 98 01/29/2024   GFRNONAA 6 (L) 01/29/2024   GFRAA 19 (L) 03/27/2019   CALCIUM  8.6 (L)  01/29/2024   PHOS 6.5 (H) 01/29/2024   PROT 7.8 01/11/2024   ALBUMIN  2.7 (L) 01/29/2024   BILITOT 0.9 01/11/2024   ALKPHOS 67 01/11/2024   AST 23 01/11/2024   ALT 9 01/11/2024   ANIONGAP 13 01/29/2024    CBG (last 3)  No results for input(s): "GLUCAP" in the last 72 hours.    Coagulation Profile: No results for input(s): "INR", "PROTIME" in the last 168 hours.   Radiology Studies: DG CHEST PORT 1 VIEW Result Date: 01/28/2024 CLINICAL DATA:  Shortness of breath.  Pulmonary edema. EXAM: PORTABLE CHEST 1 VIEW COMPARISON:  Same day chest radiograph dated 01/01/2024 at 2:08 a.m. FINDINGS: Stable cardiomegaly. Persistent pulmonary vascular congestion with bilateral interstitial opacities, most compatible with pulmonary edema. No sizable pleural effusion or pneumothorax. Visualized osseous structures are unchanged. IMPRESSION: Stable cardiomegaly with persistent pulmonary vascular congestion and pulmonary edema. Electronically Signed   By: Mannie Seek M.D.   On: 01/28/2024 19:04   DG Chest Portable 1 View Result Date: 01/28/2024 CLINICAL DATA:  Shortness of breath. EXAM: PORTABLE CHEST 1 VIEW COMPARISON:  Jan 11, 2024 FINDINGS: The cardiac silhouette is enlarged and unchanged in size. Prominence of the central pulmonary vasculature is seen. Mildly increased interstitial lung markings are also noted. No pleural effusion or pneumothorax is identified. The visualized skeletal structures are unremarkable. IMPRESSION: Cardiomegaly and pulmonary vascular congestion with mild pulmonary edema. Electronically Signed   By: Virgle Grime M.D.   On: 01/28/2024 02:13       Linda [name] M.D. [facility] 01/29/2024, 7:06 PM  Available via Epic secure chat 7am-7pm After 7 pm, please refer to night coverage provider listed on amion.

## 2024-01-29 NOTE — Consult Note (Signed)
 Renal Service Consult Note [facility]  Linda [name] 01/29/2024 [name], MD Requesting Physician: Dr. Nichole Barker  Reason for Consult: ESRD patient with shortness of breath HPI: The patient is a 28 y.o. year-old w/ PMH as below who presented to ED yesterday early morning complaining of shortness of breath for 2 days.  Has dialysis MWF and has not missed any treatments.  In ER BP , HR 95, RR 23.  Creatinine 9.8, K+ 4.9, BUN 38, Hgb 8.9, WBC 8K.  CXR showed vasc congestion and IS edema. ED called renal team to request ED dialysis.  Patient was dialyzed with 3 L removed.  Patient returned to the emergency room but continued to have dyspnea on exertion and hypoxia.  Therefore the patient was admitted to the medical service.  We are asked to see for dialysis.   Pt seen in hospital room.  Shortness of breath is better, still having DOE, orthopnea is better.   ROS - denies CP, no joint pain, no HA, no blurry vision, no rash, no diarrhea, no nausea/ vomiting  PMH: Angioedema ESRD on HD HTN GERD Lupus Migraines MCTD  Past Surgical History  Past Surgical History:  Procedure Laterality Date   GRAFT APPLICATION Left 03/30/2019   Procedure: BRACHIAL CEPHALIC GRAFT CREATION;  Surgeon: Celso College, MD;  Location: [facility] ORS;  Service: Vascular;  Laterality: Left;   I & D EXTREMITY Right 08/06/2017   Procedure: IRRIGATION AND DEBRIDEMENT KNEE;  Surgeon: Laneta Pintos, MD;  Location: [facility] OR;  Service: Orthopedics;  Laterality: Right;   IR THROMBECTOMY AV FISTULA W/THROMBOLYSIS/PTA/STENT INC/SHUNT/IMG LT Left 05/18/2022   IR US  GUIDE VASC ACCESS LEFT  05/18/2022   MULTIPLE TOOTH EXTRACTIONS     Family History  Family History  Problem Relation Age of Onset   Lupus Mother    Hypertension Mother    Kidney disease Mother    Heart Problems Mother    Coronary artery disease Mother 54       stent   Diabetes Maternal Grandmother    Social History  reports that she has  been smoking e-cigarettes and cigarettes. She started smoking about 8 years ago. She has a 2.2 pack-year smoking history. She has never used smokeless tobacco. She reports that she does not drink alcohol and does not use drugs. Allergies  Allergies  Allergen Reactions   Iodine  Hives and Shortness Of Breath   Lisinopril  Anaphylaxis    Angioedema   Gabapentin Other (See Comments)    Reaction: Tremor (intolerance); tremor   Ativan  [Lorazepam ] Other (See Comments)    Somnolent with 1mg  ativan  at [facility] Nov 2019. Patient stated it made her feel like she shut down and was mute and non-responding.   Hydroxychloroquine  Other (See Comments)    Pt reports making her skin peel really bad   Vicodin [Hydrocodone -Acetaminophen ] Itching and Rash   Home medications Prior to Admission medications   Medication Sig Start Date End Date Taking? Authorizing Provider  amLODipine  (NORVASC ) 10 MG tablet Take 10 mg by mouth at bedtime. 08/05/21  Yes [provider]  carvedilol  (COREG ) 25 MG tablet Take 1 tablet (25 mg total) by mouth 2 (two) times daily with a meal. Patient taking differently: Take 100 mg by mouth at bedtime. 09/03/23  Yes Samtani, Jai-Gurmukh, MD  furosemide  (LASIX ) 80 MG tablet Take 1 tablet (80 mg total) by mouth daily. Patient taking differently: Take 160 mg by mouth at bedtime. 12/23/23  Yes Deforest Fast, MD  lidocaine -prilocaine  (  EMLA ) cream Apply 1 Application topically once. 01/14/24  Yes [provider]  medroxyPROGESTERone  (DEPO-PROVERA ) 150 MG/ML injection Inject 1 mL (150 mg total) into the muscle every 3 (three) months. 10/29/22  Yes Gabrielle Joiner, MD  naloxone [facility]) nasal spray 4 mg/0.1 mL Place 1 spray into the nose once. If needed for emergency overdose.   Yes [provider]  nitroGLYCERIN  (NITROSTAT ) 0.4 MG SL tablet Place 1 tablet (0.4 mg total) under the tongue every 5 (five) minutes as needed for chest pain. 10/28/23  Yes Rai, Ripudeep K, MD   ondansetron  (ZOFRAN -ODT) 4 MG disintegrating tablet Take 1 tablet (4 mg total) by mouth every 8 (eight) hours as needed for nausea or vomiting. 10/28/23  Yes Rai, Ripudeep K, MD  senna-docusate (SENOKOT-S) 8.6-50 MG tablet Take 1 tablet by mouth 2 (two) times daily between meals as needed for mild constipation. 01/14/24  Yes Gonfa, Taye T, MD  tiZANidine  (ZANAFLEX ) 4 MG tablet Take 4 mg by mouth every 8 (eight) hours as needed for muscle spasms. 12/03/23  Yes [provider]     Vitals:   01/28/24 1600 01/28/24 2106 01/29/24 0641 01/29/24 0732  BP: (!) 180/130 (!) 133/100 (!) 127/95 (!) 152/104  Pulse: (!) 101 74 84 91  Resp: 20 18 18 18   Temp: 98.5 F (36.9 C) 98.2 F (36.8 C) 97.9 F (36.6 C) 98.4 F (36.9 C)  TempSrc: Oral  Oral   SpO2: 100% 98% 98% 92%  Weight:      Height:       Exam Gen alert, no distress No rash, cyanosis or gangrene Sclera anicteric, throat clear  No jvd or bruits Chest occasional basilar crackles, otherwise clear RRR no MRG Abd soft ntnd no mass or ascites +bs GU deferred MS no joint effusions or deformity Ext no LE or UE edema, no other edema Neuro is alert, Ox 3 , nf    LUA AVF + bruit    Renal-related home meds: Lasix  160 at bedtime Norvasc  10 daily Coreg  100 mg at bedtime others: Zanaflex , Zofran , SL NTG, Narcan, Depo-Provera     OP HD: [facility] MWF From 01/24/24 --> 3.5h   B350  71.5kg   AVG LUE  Hep none    Assessment/ Plan: SOB: w/ orthopnea and hypoxemia. Had HD yest w/ 3 L removed. Still hypoxic, orthopnea better. Plan HD again today/ tonight, max UF.  ESRD: on HD MWF. Had HD here yesterday. HD as above.  HTN: bp's very hign on presentation, a bit better 160/100 today Volume: no sig edema on exam Anemia of esrd: Hgb 8.9, follow Secondary hyperparathyroidism: Calcium  in range, add on phosphorus. SLE      [name]  MD [facility] 01/29/2024, 12:55 PM  Recent Labs  Lab 01/28/24 0225  HGB 8.9*  CALCIUM  9.0  CREATININE 9.82*   K 4.9   Inpatient medications:  amLODipine   10 mg Oral Daily   calcitRIOL   0.25 mcg Oral Daily   carvedilol   100 mg Oral Daily   Chlorhexidine  Gluconate Cloth  6 each Topical Q0600   furosemide   160 mg Oral Daily   heparin   5,000 Units Subcutaneous Q8H   sodium chloride  flush  3 mL Intravenous Q12H    anticoagulant sodium citrate      alteplase , anticoagulant sodium citrate , heparin , hydrALAZINE , hydrALAZINE , lidocaine -prilocaine , oxyCODONE , pentafluoroprop-tetrafluoroeth, tiZANidine 

## 2024-01-29 NOTE — Care Management Obs Status (Signed)
 MEDICARE OBSERVATION STATUS NOTIFICATION   Patient Details  Name: Linda [name] MRN: [id number] Date of Birth: Nov 18, 1995   Medicare Observation Status Notification Given:  Yes    Omie Bickers, RN 01/29/2024, 3:42 PM

## 2024-01-29 NOTE — Progress Notes (Signed)
 Received patient in bed.Alert and oriented x 4.Consent verified.  Access used: Left arm avf that worked well.  Duration of treatment: 3 hours.  Uf goal: Met 3 liters.Tolerated treatment.  Hand off to the patient's nurse,back into her room with stable condition via transporter.

## 2024-01-29 NOTE — Care Management Obs Status (Signed)
 MEDICARE OBSERVATION STATUS NOTIFICATION   Patient Details  Name: Linda [name] MRN: [id number] Date of Birth: 11-Nov-1995   Medicare Observation Status Notification Given:  No (Attempt made, pt off the unit)  Moon delivery attempt made, patient off the unit.  Jannine Meo, RN 01/29/2024, 2:28 PM

## 2024-01-29 NOTE — Plan of Care (Signed)
   Problem: Education: Goal: Knowledge of General Education information will improve Description Including pain rating scale, medication(s)/side effects and non-pharmacologic comfort measures Outcome: Progressing   Problem: Health Behavior/Discharge Planning: Goal: Ability to manage health-related needs will improve Outcome: Progressing

## 2024-01-30 DIAGNOSIS — I10 Essential (primary) hypertension: Secondary | ICD-10-CM

## 2024-01-30 DIAGNOSIS — J810 Acute pulmonary edema: Secondary | ICD-10-CM | POA: Diagnosis not present

## 2024-01-30 DIAGNOSIS — N186 End stage renal disease: Secondary | ICD-10-CM | POA: Diagnosis not present

## 2024-01-30 MED ORDER — OXYCODONE HCL 5 MG PO TABS: 5.0000 mg | ORAL_TABLET | Freq: Four times a day (QID) | ORAL | 0 refills | Status: AC | PRN

## 2024-01-30 MED ORDER — CALCITRIOL 0.25 MCG PO CAPS: 0.2500 ug | ORAL_CAPSULE | Freq: Every day | ORAL | 0 refills | Status: DC

## 2024-01-30 NOTE — Discharge Planning (Signed)
 [facility] Patient Discharge Orders- [facility] CL[facility]: [facility]   Patient's name: Kenyanna Grzesiak Admit/DC Dates: 01/28/2024 - 01/30/2024  Discharge Diagnoses: Pulm edema/Volume overload.   Recent Labs  Lab 01/28/24 0225 01/29/24 1429  HGB 8.9*  --   K 4.9 4.1  CALCIUM  9.0 8.6*  PHOS  --  6.5*  ALBUMIN   --  2.7*   Aranesp : Given: --   Date of last dose/amount: --   PRBC's Given: - Date/# of units: -- ESA dose for discharge: Mircera 225 mcg IV q 2 wks. Continue Venofer    Outpatient Dialysis Orders:  -Heparin : No change  -EDW: 69 kg  -Bath: No change.   Access intervention/Change:  --  IV Antibiotics: --    OTHER/APPTS/LABS     Completed by: Elona Hal PA-C   D/C Meds to be reconciled by nurse after every discharge.    Reviewed by: MD:______ RN_______

## 2024-01-30 NOTE — Progress Notes (Signed)
  [facility] Progress Note   Subjective:   Dialysis again yesterday. Tolerated 3L UF. On room air. Feels much better this am.    Objective Vitals:   01/29/24 1818 01/29/24 2029 01/30/24 0556 01/30/24 0722  BP: (!) 131/101 124/87 (!) 141/100 (!) 150/120  Pulse: 73 68 68 87  Resp: 18 18 18 19   Temp: 98.4 F (36.9 C) 98.2 F (36.8 C) 98.8 F (37.1 C) 98 F (36.7 C)  TempSrc: Oral   Oral  SpO2: 98% 96% 98% 99%  Weight:      Height:        Additional Objective Labs: Basic Metabolic Panel: Recent Labs  Lab 01/28/24 0225 01/29/24 1429  NA 135 132*  K 4.9 4.1  CL 97* 94*  CO2 21* 25  GLUCOSE 72 98  BUN 38* 28*  CREATININE 9.82* 8.16*  CALCIUM  9.0 8.6*  PHOS  --  6.5*   CBC: Recent Labs  Lab 01/28/24 0225  WBC 8.7  HGB 8.9*  HCT 29.5*  MCV 93.9  PLT 297   Blood Culture    Component Value Date/Time   SDES BLOOD BLOOD RIGHT ARM 10/10/2023 0206   SPECREQUEST  10/10/2023 0206    BOTTLES DRAWN AEROBIC AND ANAEROBIC Blood Culture results may not be optimal due to an inadequate volume of blood received in culture bottles   CULT  10/10/2023 0206    NO GROWTH 5 DAYS Performed at [facility], [address] [place], [place] [address]    REPTSTATUS 10/15/2023 FINAL 10/10/2023 0206     Physical Exam General: Lying in bed, nad Heart: RRR Lungs: Clear  Abdomen: soft  Extremities: No LE edema  Dialysis Access: L AVF +bruit   Medications:   amLODipine   10 mg Oral Daily   calcitRIOL   0.25 mcg Oral Daily   carvedilol   100 mg Oral Daily   Chlorhexidine  Gluconate Cloth  6 each Topical Q0600   furosemide   160 mg Oral Daily   heparin   5,000 Units Subcutaneous Q8H   sodium chloride  flush  3 mL Intravenous Q12H    Assessment/Plan: SOB: w/ orthopnea and hypoxemia. Had back to back dialysis here. Improved.   ESRD: on HD MWF. Continue on schedule.  HTN:  BP elevated. Continue home meds. UF with HD as able  Volume: no sig edema on exam. Below  dry weight here.  Anemia of esrd: Hgb 8.9, follow Secondary hyperparathyroidism: Calcium  in range. Continue home binders.  SLE Dispo - ok for discharge from renal standpoint     Elona Hal PA-C [facility] 01/30/2024,10:37 AM

## 2024-01-31 DIAGNOSIS — D689 Coagulation defect, unspecified: Secondary | ICD-10-CM | POA: Diagnosis not present

## 2024-01-31 DIAGNOSIS — Z992 Dependence on renal dialysis: Secondary | ICD-10-CM | POA: Diagnosis not present

## 2024-01-31 DIAGNOSIS — N2581 Secondary hyperparathyroidism of renal origin: Secondary | ICD-10-CM | POA: Diagnosis not present

## 2024-01-31 DIAGNOSIS — N186 End stage renal disease: Secondary | ICD-10-CM | POA: Diagnosis not present

## 2024-01-31 DIAGNOSIS — D509 Iron deficiency anemia, unspecified: Secondary | ICD-10-CM | POA: Diagnosis not present

## 2024-01-31 DIAGNOSIS — D631 Anemia in chronic kidney disease: Secondary | ICD-10-CM | POA: Diagnosis not present

## 2024-02-01 NOTE — Discharge Summary (Signed)
 Physician Discharge Summary   Patient: Linda [name] MRN: [id number] DOB: [birth year]  Admit date:     01/28/2024  Discharge date: 01/30/2024  Discharge Physician: Feliciana Horn   PCP: Francenia Ingle, NP   Recommendations at discharge:  Please follow up with PCP in one week.  Please follow up gyn as scheduled.   Discharge Diagnoses: Principal Problem:   Pulmonary edema Active Problems:   Acute pulmonary edema [facility])   Hospital Course:    28 y.o. female with history significant of SLE not on any treatment, hypertension, ESRD on HD Monday Wednesday Friday, and calciphylaxis with wounds on the extremities, presents with acute respiratory failure with hypoxia secondary to fluid overload from  missed HD sessions.   Assessment and Plan:   Acute hypoxic respiratory failure sec to pulm edema In the setting of ESRD on HD From missing HD Nephrology onboard.  Weaned off oxygen        Hypertension Better controlled.  Resume lasix . Volume management with HD.      Mixed connective tissue disorder - recommend outpatient follow up Rheumatology.        Calciphylaxis Wound care .    Anemia of chronic disease From ESRD.    Consultants: nephrology.  Procedures performed: none.   Disposition: Home Diet recommendation:  Discharge Diet Orders (From admission, onward)     Start     Ordered   01/30/24 0000  Diet - low sodium heart healthy        01/30/24 1209           Renal diet DISCHARGE MEDICATION: Allergies as of 01/30/2024       Reactions   Iodine  Hives, Shortness Of Breath   Lisinopril  Anaphylaxis   Angioedema   Gabapentin Other (See Comments)   Reaction: Tremor (intolerance); tremor   Ativan  [lorazepam ] Other (See Comments)   Somnolent with 1mg  ativan  at [facility] Nov 2019. Patient stated it made her feel like she shut down and was mute and non-responding.   Hydroxychloroquine  Other (See Comments)   Pt reports making her skin peel really bad    Vicodin [hydrocodone -acetaminophen ] Itching, Rash        Medication List     TAKE these medications    amLODipine  10 MG tablet Commonly known as: NORVASC  Take 10 mg by mouth at bedtime.   calcitRIOL  0.25 MCG capsule Commonly known as: ROCALTROL  Take 1 capsule (0.25 mcg total) by mouth daily. What changed:  how much to take when to take this   carvedilol  25 MG tablet Commonly known as: COREG  Take 1 tablet (25 mg total) by mouth 2 (two) times daily with a meal. What changed:  how much to take when to take this   furosemide  80 MG tablet Commonly known as: LASIX  Take 1 tablet (80 mg total) by mouth daily. What changed:  how much to take when to take this   lidocaine -prilocaine  cream Commonly known as: EMLA  Apply 1 Application topically once.   medroxyPROGESTERone  150 MG/ML injection Commonly known as: DEPO-PROVERA  Inject 1 mL (150 mg total) into the muscle every 3 (three) months.   Narcan 4 MG/0.1ML Liqd nasal spray kit Generic drug: naloxone Place 1 spray into the nose once. If needed for emergency overdose.   nitroGLYCERIN  0.4 MG SL tablet Commonly known as: NITROSTAT  Place 1 tablet (0.4 mg total) under the tongue every 5 (five) minutes as needed for chest pain.   ondansetron  4 MG disintegrating tablet Commonly known as: ZOFRAN -ODT Take 1 tablet (  4 mg total) by mouth every 8 (eight) hours as needed for nausea or vomiting.   oxyCODONE  5 MG immediate release tablet Commonly known as: Oxy IR/ROXICODONE  Take 1 tablet (5 mg total) by mouth every 6 (six) hours as needed for up to 3 days for moderate pain (pain score 4-6).   senna-docusate 8.6-50 MG tablet Commonly known as: Senokot-S Take 1 tablet by mouth 2 (two) times daily between meals as needed for mild constipation.   tiZANidine  4 MG tablet Commonly known as: ZANAFLEX  Take 4 mg by mouth every 8 (eight) hours as needed for muscle spasms.        Follow-up Information     Francenia Ingle, NP.  Call in 3 days.   Specialty: Family Medicine Contact information: [address] [place] [address] [phone]                Discharge Exam: Filed Weights   01/29/24 1405 01/29/24 1745 01/29/24 1810  Weight: 72 kg 69 kg 66 kg   General exam: Appears calm and comfortable  Respiratory system: Clear to auscultation. Respiratory effort normal. Cardiovascular system: S1 & S2 heard, RRR. No JVD Gastrointestinal system: Abdomen is nondistended, soft and nontender. Central nervous system: Alert and oriented. No focal neurological deficits. Extremities: calciphylaxis Skin: No rashes,  Psychiatry:  Mood & affect appropriate.    Condition at discharge: fair  The results of significant diagnostics from this hospitalization (including imaging, microbiology, ancillary and laboratory) are listed below for reference.   Imaging Studies: DG CHEST PORT 1 VIEW Result Date: 01/28/2024 CLINICAL DATA:  Shortness of breath.  Pulmonary edema. EXAM: PORTABLE CHEST 1 VIEW COMPARISON:  Same day chest radiograph dated 01/01/2024 at 2:08 a.m. FINDINGS: Stable cardiomegaly. Persistent pulmonary vascular congestion with bilateral interstitial opacities, most compatible with pulmonary edema. No sizable pleural effusion or pneumothorax. Visualized osseous structures are unchanged. IMPRESSION: Stable cardiomegaly with persistent pulmonary vascular congestion and pulmonary edema. Electronically Signed   By: Mannie Seek M.D.   On: 01/28/2024 19:04   DG Chest Portable 1 View Result Date: 01/28/2024 CLINICAL DATA:  Shortness of breath. EXAM: PORTABLE CHEST 1 VIEW COMPARISON:  Jan 11, 2024 FINDINGS: The cardiac silhouette is enlarged and unchanged in size. Prominence of the central pulmonary vasculature is seen. Mildly increased interstitial lung markings are also noted. No pleural effusion or pneumothorax is identified. The visualized skeletal structures are unremarkable. IMPRESSION: Cardiomegaly  and pulmonary vascular congestion with mild pulmonary edema. Electronically Signed   By: Virgle Grime M.D.   On: 01/28/2024 02:13   CT FEMUR RIGHT WO CONTRAST Result Date: 01/12/2024 CLINICAL DATA:  Soft tissue infection suspected, thigh. History of end-stage renal disease, lupus, and mixed connective tissue disease. EXAM: CT OF THE LOWER RIGHT EXTREMITY WITHOUT CONTRAST TECHNIQUE: Multidetector CT imaging of the right lower extremity was performed according to the standard protocol. RADIATION DOSE REDUCTION: This exam was performed according to the departmental dose-optimization program which includes automated exposure control, adjustment of the mA and/or kV according to patient size and/or use of iterative reconstruction technique. COMPARISON:  CT pelvis 01/11/2024, CT abdomen and pelvis 11/14/2023 FINDINGS: Bones/Joint/Cartilage Increased bone density is seen diffusely, again compatible with the patient's history of end-stage renal disease and chronic renal osteodystrophy. No cortical erosion. No acute fracture. Ligaments Suboptimally assessed by CT. Muscles and Tendons Normal size and density of the regional musculature. No tendon tear is seen. Soft tissues There are again numerous dystrophic calcifications seen within the midline posterior buttocks, the bilateral  more inferior buttocks, as on prior CT. Additional mild just over calcifications within the posterolateral proximal right thigh (axial series 4, images 141 through 150). There are again extensive calcification seen throughout the distal anterolateral greater than anteromedial thigh and anterior knee subcutaneous fat and more mildly within the posterolateral distal thigh. The left thigh is partially visualized and similar, symmetric calcifications are seen anterior to the left knee and minimally at the posterior medial aspect of the knee. Compared to 11/14/2023, there is mildly increased inflammatory stranding at the deep aspect of the inferior  right buttock and proximal posterior thigh dystrophic calcifications (axial series 4 images 80 through 123). However, no walled-off abscess is seen. IMPRESSION: 1. Extensive dystrophic calcifications are again seen within the midline posterior buttocks, bilateral more inferior buttocks, and throughout the distal anterolateral greater than anteromedial thigh and anterior knee subcutaneous fat. These are chronic and again likely secondary to the patient's history of chronic renal failure (calcinosis or chronic renal failure) or connective tissue disease. 2. Compared to 11/14/2023, there is mildly increased inflammatory stranding at the deep aspect of the inferior right buttock and proximal posterior thigh cluster of dystrophic calcifications. However, no walled-off abscess is seen. 3. Increased bone density is seen diffusely, again compatible with the patient's history of end-stage renal disease and chronic renal osteodystrophy. Electronically Signed   By: Bertina Broccoli M.D.   On: 01/12/2024 21:18   CT PELVIS LIMITED WO CONTRAST Result Date: 01/12/2024 CLINICAL DATA:  28 year old female with buttock abscess, history of end-stage renal disease, lupus, mixed connective tissue disease, left ventricular hypertrophy. EXAM: CT PELVIS WITHOUT CONTRAST TECHNIQUE: Multidetector CT imaging of the pelvis was performed following the standard protocol without intravenous contrast. RADIATION DOSE REDUCTION: This exam was performed according to the departmental dose-optimization program which includes automated exposure control, adjustment of the mA and/or kV according to patient size and/or use of iterative reconstruction technique. COMPARISON:  Noncontrast CT Abdomen and Pelvis 11/14/2023. FINDINGS: Urinary Tract: Kidneys not included. No evidence of hydroureter. Unremarkable urinary bladder. Bowel: Oral contrast retained with stool in the visible large bowel. No dilated loops are identified. Normal appendix containing gas on  series 6, image 62. No pneumoperitoneum. Vascular/Lymphatic: Calcified atherosclerosis of the distal abdominal aorta. Vascular patency is not evaluated in the absence of IV contrast. No convincing lymphadenopathy. Reproductive:  Negative noncontrast appearance. Other: Small volume free fluid layering dependent in the pelvis, simple fluid density. This is similar to March and probably physiologic. Musculoskeletal: Extensive, bulky dystrophic calcifications of the skin and subcutaneous soft tissues along the gluteal fold, posterior proximal thighs. These were more included on the March CT Abdomen and Pelvis. No convincing inflammation directly affecting the perineum or gluteal fold. However, in the posteromedial proximal thigh on series 6, image 123 there is an area of confluent inflammatory stranding, phlegmon which is only partially visible. No soft tissue gas identified. Background bone mineralization suggestive of renal osteodystrophy. No acute osseous abnormality identified. IMPRESSION: 1. Partially visible Phlegmon-like area in the posteromedial proximal Right thigh. No Direct involvement of the gluteal folds or perineum. If additional visualization of this presumed right thigh infection is needed then Right thigh/lower extremity CT would probably be most valuable given extensive skin and subcutaneous calcifications which would likely limit ultrasound (see #2). 2. Extensive chronic dystrophic calcifications of the skin and subcutaneous soft tissues along the gluteal fold, both posterior proximal thighs. These were more included on the March CT, nonspecific but may be related to the patient's connective tissue disease diagnosis.  3. Renal osteodystrophy suspected.  No acute osseous abnormality. 4. Small volume free fluid in the pelvis is similar to March is most likely physiologic. 5.  Aortic Atherosclerosis (ICD10-I70.0). Electronically Signed   By: Marlise Simpers M.D.   On: 01/12/2024 12:44   DG Chest Portable 1  View Result Date: 01/11/2024 CLINICAL DATA:  Shortness of breath. EXAM: PORTABLE CHEST 1 VIEW COMPARISON:  12/21/2023 FINDINGS: The cardio pericardial silhouette is enlarged. Vascular congestion with basilar predominant interstitial opacity, suspicious for edema. No dense focal airspace consolidation or pleural effusion. No acute bony abnormality. Telemetry leads overlie the chest. IMPRESSION: Enlargement of the cardiopericardial silhouette with vascular congestion and basilar predominant interstitial opacity, suspicious for edema. Electronically Signed   By: Donnal Fusi M.D.   On: 01/11/2024 05:00    Microbiology: Results for orders placed or performed during the hospital encounter of 01/28/24  MRSA Next Gen by PCR, Nasal     Status: None   Collection Time: 01/28/24  4:25 PM   Specimen: Nasal Mucosa; Nasal Swab  Result Value Ref Range Status   MRSA by PCR Next Gen NOT DETECTED NOT DETECTED Final    Comment: (NOTE) The GeneXpert MRSA Assay (FDA approved for NASAL specimens only), is one component of a comprehensive MRSA colonization surveillance program. It is not intended to diagnose MRSA infection nor to guide or monitor treatment for MRSA infections. Test performance is not FDA approved in patients less than 65 years old. Performed at [facility], [address], [place], [place] [address]   Resp panel by RT-PCR (RSV, Flu A&B, Covid) Anterior Nasal Swab     Status: None   Collection Time: 01/28/24  6:38 PM   Specimen: Anterior Nasal Swab  Result Value Ref Range Status   SARS Coronavirus 2 by RT PCR NEGATIVE NEGATIVE Final   Influenza A by PCR NEGATIVE NEGATIVE Final   Influenza B by PCR NEGATIVE NEGATIVE Final    Comment: (NOTE) The Xpert Xpress SARS-CoV-2/FLU/RSV plus assay is intended as an aid in the diagnosis of influenza from Nasopharyngeal swab specimens and should not be used as a sole basis for treatment. Nasal washings and aspirates are unacceptable for Xpert  Xpress SARS-CoV-2/FLU/RSV testing.  Fact Sheet for Patients: BloggerCourse.com  Fact Sheet for Healthcare Providers: SeriousBroker.it  This test is not yet approved or cleared by the [place]  FDA and has been authorized for detection and/or diagnosis of SARS-CoV-2 by FDA under an Emergency Use Authorization (EUA). This EUA will remain in effect (meaning this test can be used) for the duration of the COVID-19 declaration under Section 564(b)(1) of the Act, 21 U.S.C. section 360bbb-3(b)(1), unless the authorization is terminated or revoked.     Resp Syncytial Virus by PCR NEGATIVE NEGATIVE Final    Comment: (NOTE) Fact Sheet for Patients: BloggerCourse.com  Fact Sheet for Healthcare Providers: SeriousBroker.it  This test is not yet approved or cleared by the [place]  FDA and has been authorized for detection and/or diagnosis of SARS-CoV-2 by FDA under an Emergency Use Authorization (EUA). This EUA will remain in effect (meaning this test can be used) for the duration of the COVID-19 declaration under Section 564(b)(1) of the Act, 21 U.S.C. section 360bbb-3(b)(1), unless the authorization is terminated or revoked.  Performed at [facility], [address], [place], [place] [address]     Labs: CBC: Recent Labs  Lab 01/28/24 0225  WBC 8.7  HGB 8.9*  HCT 29.5*  MCV 93.9  PLT 297  Basic Metabolic Panel: Recent Labs  Lab 01/28/24 0225 01/29/24 1429  NA 135 132*  K 4.9 4.1  CL 97* 94*  CO2 21* 25  GLUCOSE 72 98  BUN 38* 28*  CREATININE 9.82* 8.16*  CALCIUM  9.0 8.6*  PHOS  --  6.5*   Liver Function Tests: Recent Labs  Lab 01/29/24 1429  ALBUMIN  2.7*   CBG: No results for input(s): "GLUCAP" in the last 168 hours.  Discharge time spent: 38 minutes.   Signed: Feliciana Horn, MD [facility] 02/01/2024

## 2024-02-02 ENCOUNTER — Ambulatory Visit: Admitting: Obstetrics and Gynecology

## 2024-02-02 ENCOUNTER — Encounter (HOSPITAL_BASED_OUTPATIENT_CLINIC_OR_DEPARTMENT_OTHER): Payer: Self-pay

## 2024-02-02 VITALS — BP 155/109 | HR 74 | Ht 66.0 in | Wt 158.9 lb

## 2024-02-02 DIAGNOSIS — D509 Iron deficiency anemia, unspecified: Secondary | ICD-10-CM | POA: Diagnosis not present

## 2024-02-02 DIAGNOSIS — L989 Disorder of the skin and subcutaneous tissue, unspecified: Secondary | ICD-10-CM | POA: Diagnosis not present

## 2024-02-02 DIAGNOSIS — N898 Other specified noninflammatory disorders of vagina: Secondary | ICD-10-CM | POA: Diagnosis not present

## 2024-02-02 DIAGNOSIS — D631 Anemia in chronic kidney disease: Secondary | ICD-10-CM | POA: Diagnosis not present

## 2024-02-02 DIAGNOSIS — N186 End stage renal disease: Secondary | ICD-10-CM | POA: Diagnosis not present

## 2024-02-02 DIAGNOSIS — D689 Coagulation defect, unspecified: Secondary | ICD-10-CM | POA: Diagnosis not present

## 2024-02-02 DIAGNOSIS — N2581 Secondary hyperparathyroidism of renal origin: Secondary | ICD-10-CM | POA: Diagnosis not present

## 2024-02-02 DIAGNOSIS — Z992 Dependence on renal dialysis: Secondary | ICD-10-CM | POA: Diagnosis not present

## 2024-02-02 MED ORDER — VALACYCLOVIR HCL 1 G PO TABS: 1000.0000 mg | ORAL_TABLET | Freq: Every day | ORAL | 0 refills | Status: DC

## 2024-02-02 NOTE — Progress Notes (Signed)
 hs  GYNECOLOGY PROGRESS NOTE  History:  28 y.o. G0P0000 presents to [facility] office today for problem gyn visit. Vaginal pain started on the outside about one week ago, now also on the inside. Reports pain is aching and stinging around clitoris.  Hospital told her she might need valtrex . Reports 5 years ago dx and tx for herpes.  Reports two spots on buttocks that are very large and painful. Was seen by gen surg but could not determine full etiology, they were considering build up calcium  deposits    Health Maintenance Due  Topic Date Due   DTaP/Tdap/Td (1 - Tdap) Never done   Pneumococcal Vaccine 83-73 Years old (1 of 2 - PCV) Never done   COVID-19 Vaccine (3 - Pfizer risk series) 08/12/2021   Medicare Annual Wellness (AWV)  12/11/2023     Review of Systems:  Pertinent items are noted in HPI.   Objective:  Physical Exam Blood pressure (!) 155/109, pulse 74, height 5\' 6"  (1.676 m), weight 158 lb 14.4 oz (72.1 kg). VS reviewed, nursing note reviewed,  Constitutional: well developed, well nourished, no distress HEENT: normocephalic CV: normal rate Pulm/chest wall: normal effort Breast Exam: deferred Abdomen: soft Neuro: alert and oriented  Skin:  6x4cm lesion left buttocks, 7x6cm lesion right buttocks Psych: affect normal Pelvic exam: Cervix pink, visually closed, without lesion, scant white creamy discharge, vaginal walls normal, external vagina normal, vesicular lesion posterior to clitoris   Assessment & Plan:  1. Vaginal lesion (Primary) Given reported hx of hsv and vesicle, suspicion for outbreak, valtrex  sent to pharmacy - Herpes simplex virus culture -valtrex  2. Skin lesions Significant lesion bilateral buttocks, reports she saw surg at hospital and were considering surgery   Return in about 1 week (around 02/09/2024), or follow up with MD.    Susi Eric, FNP 4:26 PM

## 2024-02-02 NOTE — Progress Notes (Signed)
 Pt presents for vaginal pain. Pt states that the pain is moving from the outside to the inside. No discharge or any other symptoms.

## 2024-02-03 ENCOUNTER — Encounter (HOSPITAL_BASED_OUTPATIENT_CLINIC_OR_DEPARTMENT_OTHER): Payer: Self-pay | Admitting: Family

## 2024-02-03 ENCOUNTER — Ambulatory Visit (HOSPITAL_BASED_OUTPATIENT_CLINIC_OR_DEPARTMENT_OTHER): Admitting: Family

## 2024-02-03 VITALS — BP 160/120 | HR 72 | Ht 66.0 in | Wt 163.6 lb

## 2024-02-03 DIAGNOSIS — N186 End stage renal disease: Secondary | ICD-10-CM

## 2024-02-03 DIAGNOSIS — I5020 Unspecified systolic (congestive) heart failure: Secondary | ICD-10-CM

## 2024-02-03 DIAGNOSIS — I10 Essential (primary) hypertension: Secondary | ICD-10-CM

## 2024-02-03 MED ORDER — NEBIVOLOL HCL 10 MG PO TABS: 10.0000 mg | ORAL_TABLET | Freq: Every day | ORAL | 3 refills | Status: DC

## 2024-02-03 MED ORDER — IRBESARTAN 300 MG PO TABS: 300.0000 mg | ORAL_TABLET | Freq: Every day | ORAL | 3 refills | Status: DC

## 2024-02-03 NOTE — Patient Instructions (Signed)
 Medication Instructions:  Your physician has recommended you make the following change in your medication:   Stop: Carvedilol    Resume: Irbesartan  300mg  daily   Start: Nebivolol 10mg  daily   Follow-Up: Please follow up in 4-6 weeks in [facility] with Dr. Theodis Fiscal, Neomi Banks, NP or Donivan Furry PharmD

## 2024-02-03 NOTE — Progress Notes (Signed)
 [facility] Initial Assessment:    Date:  02/03/2024   ID:  Linda [name], Linda [name] 1996/04/09, MRN [id number]  PCP:  Francenia Ingle, NP  Cardiologist:  None  Nephrologist: Dr. Pearline Bouillon  Referring MD: Jacqueline Matsu, MD   CC: Hypertension  History of Present Illness:    Linda [name] is a 28 y.o. female with a hx of SLE, HTN, ESRD on MD (M/W/F), calciphlaxis, HFmrEF, anemia here to establish care in the [facility].   Prior renal duplex 02/2018 with no renal artery stenosis. Echo 10/25/23 LVEF 40-45%, LV global hypkinesis, severe LVH, RV normal, no significant valvular abnormalities. Prior CT 10/2023 normal adrenals.  5/13 - 01/14/24 with shortness of breath and chronic right buttock pain due to chronic right buttock mass from possible calciphylaxis.  CT with clusters of dystrophic calcifications in inferior right buttock and proximal posterior thigh with mildly increased inflammatory stranding without abscess.  Required removal of 3 L and 4 L of fluid on HD days respectively.  Imdur  30 mg daily added.  Admitted 5/30 - 01/30/24 with acute hypoxic respiratory failure secondary to pulmonary edema in the setting of missed HD.  Antihypertension regimen she was discharged on included amlodipine  10 mg daily, carvedilol  25 mg twice daily, Lasix  80 mg daily.  Discussed the use of AI scribe software for clinical note transcription with the patient, who gave verbal consent to proceed.  History of Present Illness Hypertension has been difficult to control since age 36, with frequent elevated blood pressure during dialysis, though it was normal at the last session. She lacks a home blood pressure cuff due to a recent move. Does note she has previously self-adjusted medications. She had a bad reaction to lisinopril  and severe headaches with isosorbide .   She experiences occasional chest pain, pressure, or tightness at rest and with  movement, resolving spontaneously. Breathing is poor, with multiple hospitalizations for fluid retention. Has been overall improved since most recent discharge. Abdominal bloating was present without significant peripheral edema previously when volume overloaded. No cough but does note occasional wheeze particularly when laying.   She feels consistently tired with disrupted sleep due to racing thoughts. She recently moved in with her grandmother, leading to more frequent dining out and less cooking. She enjoys walking and hiking but has not participated recently. No leg pain occurs with walking.   Previous antihypertensives: Lisinopril  - anaphylaxis Imdur  - headache   Past Medical History:  Diagnosis Date   Abnormal ECG    a. In setting of LVH w/ repolarization abnormalities; b. 03/2019 MV: No ischemia/infarct. EF 55-65%.   Angioedema    ESRD on hemodialysis (HCC)    GERD (gastroesophageal reflux disease)    Hypertension    Lupus    LVH (left ventricular hypertrophy)    a. 11/2017 Echo: EF 50-55%. triv AI. Mild MR. Mod L and mild R atrial enlargement. Nl RV size/fxn. Small pericardial effusion w/o tampondae; b. 03/2018 Echo: EF 55-60%, restrictive filling pattern. Nl RV size/fxn. Sev LAE. Mild MR, mild to mod TR/PR. Mod PAH. Triv effusion.   Migraines    Mixed connective tissue disease (HCC)    Previous Dialysis patient [facility])    a. Off HD since late 2020.   Septic prepatellar bursitis of right knee 08/19/2017    Past Surgical History:  Procedure Laterality Date   GRAFT APPLICATION Left 03/30/2019   Procedure: BRACHIAL CEPHALIC GRAFT CREATION;  Surgeon: Celso College, MD;  Location: [facility] ORS;  Service: Vascular;  Laterality: Left;   I & D EXTREMITY Right 08/06/2017   Procedure: IRRIGATION AND DEBRIDEMENT KNEE;  Surgeon: Laneta Pintos, MD;  Location: [facility] OR;  Service: Orthopedics;  Laterality: Right;   IR THROMBECTOMY AV FISTULA W/THROMBOLYSIS/PTA/STENT INC/SHUNT/IMG LT Left 05/18/2022    IR US  GUIDE VASC ACCESS LEFT  05/18/2022   MULTIPLE TOOTH EXTRACTIONS      Current Medications: No outpatient medications have been marked as taking for the 02/03/24 encounter (Office Visit) with Clearnce Curia, NP.     Allergies:   Iodine , Lisinopril , Gabapentin, Ativan  [lorazepam ], Hydroxychloroquine , and Vicodin [hydrocodone -acetaminophen ]   Social History   Socioeconomic History   Marital status: Single    Spouse name: Not on file   Number of children: 0   Years of education: Not on file   Highest education level: Some college, no degree  Occupational History   Not on file  Tobacco Use   Smoking status: Every Day    Current packs/day: 0.25    Average packs/day: 0.3 packs/day for 9.0 years (2.2 ttl pk-yrs)    Types: E-cigarettes, Cigarettes    Start date: 02/11/2015   Smokeless tobacco: Never   Tobacco comments:    02/03/2024 Patient smokes 3 cigarettes daily  Vaping Use   Vaping status: Every Day  Substance and Sexual Activity   Alcohol use: No    Alcohol/week: 0.0 standard drinks of alcohol   Drug use: No   Sexual activity: Yes    Partners: Male    Birth control/protection: None  Other Topics Concern   Not on file  Social History Narrative   Lives with boyfriend   Social Drivers of Health   Financial Resource Strain: Low Risk  (09/10/2023)   Received from [facility]   Overall Financial Resource Strain (CARDIA)    Difficulty of Paying Living Expenses: Not hard at all  Food Insecurity: No Food Insecurity (01/28/2024)   Hunger Vital Sign    Worried About Running Out of Food in the Last Year: Never true    Ran Out of Food in the Last Year: Never true  Recent Concern: Food Insecurity - Food Insecurity Present (11/25/2023)   Hunger Vital Sign    Worried About Running Out of Food in the Last Year: Sometimes true    Ran Out of Food in the Last Year: Sometimes true  Transportation Needs: Unmet Transportation Needs (01/28/2024)   PRAPARE - Transportation    Lack of  Transportation (Medical): Yes    Lack of Transportation (Non-Medical): Yes  Physical Activity: Inactive (01/26/2024)   Exercise Vital Sign    Days of Exercise per Week: 0 days    Minutes of Exercise per Session: 0 min  Stress: No Stress Concern Present (01/26/2024)   [employer] of Occupational Health - Occupational Stress Questionnaire    Feeling of Stress : Only a little  Social Connections: Socially Isolated (01/26/2024)   Social Connection and Isolation Panel [NHANES]    Frequency of Communication with Friends and Family: Three times a week    Frequency of Social Gatherings with Friends and Family: Three times a week    Attends Religious Services: Never    Active Member of Clubs or Organizations: No    Attends Banker Meetings: Never    Marital Status: Never married     Family History: The patient's family history includes Coronary artery disease (age of onset: 2) in her mother; Diabetes in her maternal grandmother; Heart Problems in her mother; Hypertension  in her mother; Kidney disease in her mother; Lupus in her mother.  ROS:   Please see the history of present illness.     All other systems reviewed and are negative.  EKGs/Labs/Other Studies Reviewed:         Recent Labs: 10/24/2023: TSH 1.081 01/11/2024: ALT 9; B Natriuretic Peptide >4,500.0 01/13/2024: Magnesium  2.2 01/28/2024: Hemoglobin 8.9; Platelets 297 01/29/2024: BUN 28; Creatinine, Ser 8.16; Potassium 4.1; Sodium 132   Recent Lipid Panel No results found for: "CHOL", "TRIG", "HDL", "CHOLHDL", "VLDL", "LDLCALC", "LDLDIRECT"  Physical Exam:   VS:  Ht 5\' 6"  (1.676 m)   Wt 163 lb 9.6 oz (74.2 kg)   LMP  (LMP Unknown) Comment: injection  BMI 26.41 kg/m  , BMI Body mass index is 26.41 kg/m. GENERAL:  Well appearing HEENT: Pupils equal round and reactive, fundi not visualized, oral mucosa unremarkable NECK:  No jugular venous distention, waveform within normal limits, carotid upstroke brisk and  symmetric, no bruits, no thyromegaly LYMPHATICS:  No cervical adenopathy LUNGS:  Clear to auscultation bilaterally HEART:  RRR.  PMI not displaced or sustained,S1 and S2 within normal limits, no S3, no S4, no clicks, no rubs, no murmurs ABD:  Flat, positive bowel sounds normal in frequency in pitch, no bruits, no rebound, no guarding, no midline pulsatile mass, no hepatomegaly, no splenomegaly EXT:  2 plus pulses throughout, no edema, no cyanosis no clubbing SKIN:  No rashes no nodules NEURO:  Cranial nerves II through XII grossly intact, motor grossly intact throughout PSYCH:  Cognitively intact, oriented to person place and time   ASSESSMENT/PLAN:    HTN - Onset around 28 yo. Difficult to control particularly given ESRD. Once per day medications easier.  Did not take her medications today.  Does note that she often takes medications prior to dialysis. Advised to take medications after dialysis STOP Carvedilol . START Nebivolol 10mg  daily. Can further up titrate at follow up  Continue Irbesartan  300mg  daily, Lasix  80mg  daily, Amlodipine  10mg  daily Encouraged to locate her home BP cuff or write down BP at HD Avoid hydralazine  given lupus  Recommend aiming for 150 minutes of moderate intensity activity per week and following a heart healthy diet.    ESRD on HD - per nephrology. Does still make urine, defer Lasix  dosing to nephrology team.   Lupus - per nephrology. Avoid hydralazine .   HFrEF - Euvolemic and well compensated on exam. Diuresis per HD. Continue Irbesartan  300mg  daily. GDMT limited by ESRD.   Screening for Secondary Hypertension:     Relevant Labs/Studies:    Latest Ref Rng & Units 01/29/2024    2:29 PM 01/28/2024    2:25 AM 01/13/2024    7:05 AM  Basic Labs  Sodium 135 - 145 mmol/L 132  135  135   Potassium 3.5 - 5.1 mmol/L 4.1  4.9  4.6   Creatinine 0.44 - 1.00 mg/dL 1.61  0.96  0.45        Latest Ref Rng & Units 10/24/2023    1:46 PM 01/06/2018    4:49 PM  Thyroid    TSH 0.350 - 4.500 uIU/mL 1.081  0.477                 Disposition:    FU with MD/APP/PharmD in 4-6 weeks    Medication Adjustments/Labs and Tests Ordered: Current medicines are reviewed at length with the patient today.  Concerns regarding medicines are outlined above.  No orders of the defined types were placed in this  encounter.  No orders of the defined types were placed in this encounter.    Signed, Clearnce Curia, NP  02/03/2024 2:01 PM    [facility]

## 2024-02-04 DIAGNOSIS — Z992 Dependence on renal dialysis: Secondary | ICD-10-CM | POA: Diagnosis not present

## 2024-02-04 DIAGNOSIS — D509 Iron deficiency anemia, unspecified: Secondary | ICD-10-CM | POA: Diagnosis not present

## 2024-02-04 DIAGNOSIS — N2581 Secondary hyperparathyroidism of renal origin: Secondary | ICD-10-CM | POA: Diagnosis not present

## 2024-02-04 DIAGNOSIS — D689 Coagulation defect, unspecified: Secondary | ICD-10-CM | POA: Diagnosis not present

## 2024-02-04 DIAGNOSIS — D631 Anemia in chronic kidney disease: Secondary | ICD-10-CM | POA: Diagnosis not present

## 2024-02-04 DIAGNOSIS — N186 End stage renal disease: Secondary | ICD-10-CM | POA: Diagnosis not present

## 2024-02-05 LAB — HERPES SIMPLEX VIRUS CULTURE

## 2024-02-08 ENCOUNTER — Emergency Department (HOSPITAL_COMMUNITY)

## 2024-02-08 ENCOUNTER — Inpatient Hospital Stay (HOSPITAL_COMMUNITY)
Admission: EM | Admit: 2024-02-08 | Discharge: 2024-02-12 | DRG: 291 | Disposition: A | Discharge status: Home / Self Care | Attending: Internal Medicine | Admitting: Internal Medicine

## 2024-02-08 ENCOUNTER — Other Ambulatory Visit: Payer: Self-pay

## 2024-02-08 ENCOUNTER — Telehealth: Payer: Self-pay

## 2024-02-08 ENCOUNTER — Ambulatory Visit: Payer: Self-pay

## 2024-02-08 ENCOUNTER — Ambulatory Visit: Admitting: Family Medicine

## 2024-02-08 DIAGNOSIS — I10 Essential (primary) hypertension: Secondary | ICD-10-CM | POA: Diagnosis present

## 2024-02-08 DIAGNOSIS — R404 Transient alteration of awareness: Secondary | ICD-10-CM | POA: Diagnosis not present

## 2024-02-08 DIAGNOSIS — Z833 Family history of diabetes mellitus: Secondary | ICD-10-CM | POA: Diagnosis not present

## 2024-02-08 DIAGNOSIS — I517 Cardiomegaly: Secondary | ICD-10-CM | POA: Diagnosis not present

## 2024-02-08 DIAGNOSIS — G8929 Other chronic pain: Secondary | ICD-10-CM | POA: Diagnosis not present

## 2024-02-08 DIAGNOSIS — E8779 Other fluid overload: Secondary | ICD-10-CM | POA: Diagnosis not present

## 2024-02-08 DIAGNOSIS — Z992 Dependence on renal dialysis: Secondary | ICD-10-CM

## 2024-02-08 DIAGNOSIS — Z91041 Radiographic dye allergy status: Secondary | ICD-10-CM | POA: Diagnosis not present

## 2024-02-08 DIAGNOSIS — Z8249 Family history of ischemic heart disease and other diseases of the circulatory system: Secondary | ICD-10-CM | POA: Diagnosis not present

## 2024-02-08 DIAGNOSIS — K219 Gastro-esophageal reflux disease without esophagitis: Secondary | ICD-10-CM | POA: Diagnosis not present

## 2024-02-08 DIAGNOSIS — R42 Dizziness and giddiness: Secondary | ICD-10-CM | POA: Diagnosis not present

## 2024-02-08 DIAGNOSIS — R109 Unspecified abdominal pain: Secondary | ICD-10-CM | POA: Diagnosis not present

## 2024-02-08 DIAGNOSIS — Z841 Family history of disorders of kidney and ureter: Secondary | ICD-10-CM

## 2024-02-08 DIAGNOSIS — E16A2 Hypoglycemia level 2: Secondary | ICD-10-CM | POA: Diagnosis not present

## 2024-02-08 DIAGNOSIS — Z885 Allergy status to narcotic agent status: Secondary | ICD-10-CM

## 2024-02-08 DIAGNOSIS — N2581 Secondary hyperparathyroidism of renal origin: Secondary | ICD-10-CM | POA: Diagnosis present

## 2024-02-08 DIAGNOSIS — R112 Nausea with vomiting, unspecified: Secondary | ICD-10-CM | POA: Diagnosis present

## 2024-02-08 DIAGNOSIS — E162 Hypoglycemia, unspecified: Secondary | ICD-10-CM | POA: Diagnosis not present

## 2024-02-08 DIAGNOSIS — M1A9XX1 Chronic gout, unspecified, with tophus (tophi): Secondary | ICD-10-CM | POA: Diagnosis present

## 2024-02-08 DIAGNOSIS — I132 Hypertensive heart and chronic kidney disease with heart failure and with stage 5 chronic kidney disease, or end stage renal disease: Secondary | ICD-10-CM | POA: Diagnosis not present

## 2024-02-08 DIAGNOSIS — Z91158 Patient's noncompliance with renal dialysis for other reason: Secondary | ICD-10-CM | POA: Diagnosis not present

## 2024-02-08 DIAGNOSIS — R1084 Generalized abdominal pain: Secondary | ICD-10-CM | POA: Diagnosis not present

## 2024-02-08 DIAGNOSIS — I120 Hypertensive chronic kidney disease with stage 5 chronic kidney disease or end stage renal disease: Secondary | ICD-10-CM | POA: Diagnosis not present

## 2024-02-08 DIAGNOSIS — M3214 Glomerular disease in systemic lupus erythematosus: Secondary | ICD-10-CM

## 2024-02-08 DIAGNOSIS — R5383 Other fatigue: Secondary | ICD-10-CM | POA: Diagnosis not present

## 2024-02-08 DIAGNOSIS — Z8269 Family history of other diseases of the musculoskeletal system and connective tissue: Secondary | ICD-10-CM

## 2024-02-08 DIAGNOSIS — R188 Other ascites: Secondary | ICD-10-CM | POA: Diagnosis present

## 2024-02-08 DIAGNOSIS — F17290 Nicotine dependence, other tobacco product, uncomplicated: Secondary | ICD-10-CM | POA: Diagnosis not present

## 2024-02-08 DIAGNOSIS — Z886 Allergy status to analgesic agent status: Secondary | ICD-10-CM

## 2024-02-08 DIAGNOSIS — M329 Systemic lupus erythematosus, unspecified: Secondary | ICD-10-CM | POA: Diagnosis present

## 2024-02-08 DIAGNOSIS — I5023 Acute on chronic systolic (congestive) heart failure: Secondary | ICD-10-CM | POA: Diagnosis present

## 2024-02-08 DIAGNOSIS — Z91199 Patient's noncompliance with other medical treatment and regimen due to unspecified reason: Secondary | ICD-10-CM

## 2024-02-08 DIAGNOSIS — D631 Anemia in chronic kidney disease: Secondary | ICD-10-CM | POA: Diagnosis present

## 2024-02-08 DIAGNOSIS — Z888 Allergy status to other drugs, medicaments and biological substances status: Secondary | ICD-10-CM | POA: Diagnosis not present

## 2024-02-08 DIAGNOSIS — E11649 Type 2 diabetes mellitus with hypoglycemia without coma: Secondary | ICD-10-CM | POA: Diagnosis not present

## 2024-02-08 DIAGNOSIS — F17210 Nicotine dependence, cigarettes, uncomplicated: Secondary | ICD-10-CM | POA: Diagnosis present

## 2024-02-08 DIAGNOSIS — N186 End stage renal disease: Secondary | ICD-10-CM

## 2024-02-08 DIAGNOSIS — G43909 Migraine, unspecified, not intractable, without status migrainosus: Secondary | ICD-10-CM | POA: Diagnosis present

## 2024-02-08 DIAGNOSIS — G4489 Other headache syndrome: Secondary | ICD-10-CM | POA: Diagnosis not present

## 2024-02-08 DIAGNOSIS — R1011 Right upper quadrant pain: Secondary | ICD-10-CM | POA: Diagnosis not present

## 2024-02-08 DIAGNOSIS — E8770 Fluid overload, unspecified: Secondary | ICD-10-CM | POA: Diagnosis not present

## 2024-02-08 DIAGNOSIS — L0231 Cutaneous abscess of buttock: Secondary | ICD-10-CM

## 2024-02-08 DIAGNOSIS — I3139 Other pericardial effusion (noninflammatory): Secondary | ICD-10-CM | POA: Diagnosis not present

## 2024-02-08 DIAGNOSIS — Z79899 Other long term (current) drug therapy: Secondary | ICD-10-CM

## 2024-02-08 DIAGNOSIS — D638 Anemia in other chronic diseases classified elsewhere: Secondary | ICD-10-CM | POA: Diagnosis present

## 2024-02-08 LAB — CBC WITH DIFFERENTIAL/PLATELET
Abs Immature Granulocytes: 0.03 10*3/uL (ref 0.00–0.07)
Basophils Absolute: 0.1 10*3/uL (ref 0.0–0.1)
Basophils Relative: 1 %
Eosinophils Absolute: 0.3 10*3/uL (ref 0.0–0.5)
Eosinophils Relative: 4 %
HCT: 32.3 % — ABNORMAL LOW (ref 36.0–46.0)
Hemoglobin: 9.6 g/dL — ABNORMAL LOW (ref 12.0–15.0)
Immature Granulocytes: 0 %
Lymphocytes Relative: 32 %
Lymphs Abs: 2.4 10*3/uL (ref 0.7–4.0)
MCH: 28.8 pg (ref 26.0–34.0)
MCHC: 29.7 g/dL — ABNORMAL LOW (ref 30.0–36.0)
MCV: 97 fL (ref 80.0–100.0)
Monocytes Absolute: 0.8 10*3/uL (ref 0.1–1.0)
Monocytes Relative: 10 %
Neutro Abs: 3.9 10*3/uL (ref 1.7–7.7)
Neutrophils Relative %: 53 %
Platelets: 219 10*3/uL (ref 150–400)
RBC: 3.33 MIL/uL — ABNORMAL LOW (ref 3.87–5.11)
RDW: 20.1 % — ABNORMAL HIGH (ref 11.5–15.5)
WBC: 7.4 10*3/uL (ref 4.0–10.5)
nRBC: 0 % (ref 0.0–0.2)

## 2024-02-08 LAB — COMPREHENSIVE METABOLIC PANEL WITH GFR
ALT: 14 U/L (ref 0–44)
AST: 23 U/L (ref 15–41)
Albumin: 3.4 g/dL — ABNORMAL LOW (ref 3.5–5.0)
Alkaline Phosphatase: 95 U/L (ref 38–126)
Anion gap: 16 — ABNORMAL HIGH (ref 5–15)
BUN: 53 mg/dL — ABNORMAL HIGH (ref 6–20)
CO2: 20 mmol/L — ABNORMAL LOW (ref 22–32)
Calcium: 9.1 mg/dL (ref 8.9–10.3)
Chloride: 98 mmol/L (ref 98–111)
Creatinine, Ser: 11.84 mg/dL — ABNORMAL HIGH (ref 0.44–1.00)
GFR, Estimated: 4 mL/min — ABNORMAL LOW (ref >60)
Glucose, Bld: 113 mg/dL — ABNORMAL HIGH (ref 70–99)
Potassium: 4.6 mmol/L (ref 3.5–5.1)
Sodium: 134 mmol/L — ABNORMAL LOW (ref 135–145)
Total Bilirubin: 1.1 mg/dL (ref 0.0–1.2)
Total Protein: 7.6 g/dL (ref 6.5–8.1)

## 2024-02-08 LAB — CBG MONITORING, ED
Glucose-Capillary: 124 mg/dL — ABNORMAL HIGH (ref 70–99)
Glucose-Capillary: 30 mg/dL — CL (ref 70–99)
Glucose-Capillary: 34 mg/dL — CL (ref 70–99)
Glucose-Capillary: 44 mg/dL — CL (ref 70–99)

## 2024-02-08 LAB — HCG, SERUM, QUALITATIVE: Preg, Serum: NEGATIVE

## 2024-02-08 LAB — TROPONIN I (HIGH SENSITIVITY): Troponin I (High Sensitivity): 42 ng/L — ABNORMAL HIGH (ref <18)

## 2024-02-08 LAB — BRAIN NATRIURETIC PEPTIDE: B Natriuretic Peptide: >4500 pg/mL — ABNORMAL HIGH (ref 0.0–100.0)

## 2024-02-08 LAB — LIPASE, BLOOD: Lipase: 28 U/L (ref 11–51)

## 2024-02-08 MED ORDER — PANTOPRAZOLE SODIUM 40 MG IV SOLR
40.0000 mg | Freq: Once | INTRAVENOUS | Status: AC
  Administered 2024-02-08: 40 mg via INTRAVENOUS
  Filled 2024-02-08: qty 10

## 2024-02-08 MED ORDER — ACETAMINOPHEN 325 MG PO TABS: 650.0000 mg | ORAL_TABLET | Freq: Four times a day (QID) | ORAL | Status: DC | PRN

## 2024-02-08 MED ORDER — OXYCODONE HCL 5 MG PO TABS
5.0000 mg | ORAL_TABLET | ORAL | Status: DC | PRN
  Administered 2024-02-09 – 2024-02-12 (×9): 10 mg via ORAL
  Filled 2024-02-08 (×8): qty 2

## 2024-02-08 MED ORDER — NEBIVOLOL HCL 10 MG PO TABS: 10.0000 mg | ORAL_TABLET | Freq: Every day | ORAL | Status: DC

## 2024-02-08 MED ORDER — HYDROMORPHONE HCL 1 MG/ML IJ SOLN
0.5000 mg | Freq: Once | INTRAMUSCULAR | Status: AC
  Administered 2024-02-08: 0.5 mg via INTRAVENOUS
  Filled 2024-02-08: qty 1

## 2024-02-08 MED ORDER — DEXTROSE 50 % IV SOLN
50.0000 mL | Freq: Once | INTRAVENOUS | Status: AC
  Administered 2024-02-08: 50 mL via INTRAVENOUS
  Filled 2024-02-08: qty 50

## 2024-02-08 MED ORDER — CHLORHEXIDINE GLUCONATE CLOTH 2 % EX PADS
6.0000 | MEDICATED_PAD | Freq: Every day | CUTANEOUS | Status: DC
  Administered 2024-02-10 – 2024-02-12 (×3): 6 via TOPICAL

## 2024-02-08 MED ORDER — DEXTROSE 50 % IV SOLN
1.0000 | INTRAVENOUS | Status: DC | PRN
  Administered 2024-02-09 (×4): 50 mL via INTRAVENOUS
  Filled 2024-02-08 (×4): qty 50

## 2024-02-08 MED ORDER — ACETAMINOPHEN 650 MG RE SUPP: 650.0000 mg | Freq: Four times a day (QID) | RECTAL | Status: DC | PRN

## 2024-02-08 MED ORDER — PROMETHAZINE HCL 25 MG/ML IJ SOLN
25.0000 mg | INTRAMUSCULAR | Status: AC
  Administered 2024-02-08: 25 mg via INTRAMUSCULAR
  Filled 2024-02-08: qty 1

## 2024-02-08 MED ORDER — SEVELAMER CARBONATE 2.4 G PO PACK
2.4000 g | PACK | Freq: Two times a day (BID) | ORAL | Status: DC
  Administered 2024-02-10 – 2024-02-12 (×5): 2.4 g via ORAL
  Filled 2024-02-08 (×8): qty 1

## 2024-02-08 MED ORDER — HEPARIN SODIUM (PORCINE) 5000 UNIT/ML IJ SOLN
5000.0000 [IU] | Freq: Three times a day (TID) | INTRAMUSCULAR | Status: DC
  Administered 2024-02-09 – 2024-02-11 (×4): 5000 [IU] via SUBCUTANEOUS
  Filled 2024-02-08 (×6): qty 1

## 2024-02-08 MED ORDER — AMLODIPINE BESYLATE 10 MG PO TABS
10.0000 mg | ORAL_TABLET | Freq: Every day | ORAL | Status: DC
  Administered 2024-02-09 – 2024-02-11 (×4): 10 mg via ORAL
  Filled 2024-02-08: qty 1
  Filled 2024-02-08: qty 2
  Filled 2024-02-08 (×2): qty 1

## 2024-02-08 MED ORDER — PROCHLORPERAZINE EDISYLATE 10 MG/2ML IJ SOLN: 10.0000 mg | Freq: Four times a day (QID) | INTRAMUSCULAR | Status: DC | PRN

## 2024-02-08 MED ORDER — HYDROMORPHONE HCL 1 MG/ML IJ SOLN
0.5000 mg | INTRAMUSCULAR | Status: AC | PRN
  Administered 2024-02-09 (×4): 0.5 mg via INTRAVENOUS
  Filled 2024-02-08 (×2): qty 1

## 2024-02-08 MED ORDER — SODIUM CHLORIDE 0.9% FLUSH
3.0000 mL | Freq: Two times a day (BID) | INTRAVENOUS | Status: DC
  Administered 2024-02-09 – 2024-02-12 (×6): 3 mL via INTRAVENOUS

## 2024-02-08 MED ORDER — IRBESARTAN 300 MG PO TABS
300.0000 mg | ORAL_TABLET | Freq: Every day | ORAL | Status: DC
  Administered 2024-02-09 – 2024-02-12 (×4): 300 mg via ORAL
  Filled 2024-02-08 (×4): qty 1

## 2024-02-08 MED ORDER — BISACODYL 5 MG PO TBEC: 5.0000 mg | DELAYED_RELEASE_TABLET | Freq: Every day | ORAL | Status: DC | PRN

## 2024-02-08 MED ORDER — SENNOSIDES-DOCUSATE SODIUM 8.6-50 MG PO TABS: 1.0000 | ORAL_TABLET | Freq: Every evening | ORAL | Status: DC | PRN

## 2024-02-08 NOTE — ED Provider Notes (Signed)
 MWF dialysis who missed diaylsis yesterday with recent abd pain and anorexia.  Hypoglycemia here which required D10.  Still have diffuse abd pain but no V or diarrhea today.  Some pain after dialudid but improved.  CT pending for further evaluation.  Patient continues to have abdominal pain and intermittent nausea.   Reports the pain is the worst in the upper abdomen.  Also complaining of a heartburn type feeling. Final result by Philippa Bray, MD (02/08/24 19:14:04)           Narrative:   CLINICAL DATA:  Acute nonlocalized abdominal pain  EXAM: CT ABDOMEN AND PELVIS WITHOUT CONTRAST  TECHNIQUE: Multidetector CT imaging of the abdomen and pelvis was performed following the standard protocol without IV contrast.  RADIATION DOSE REDUCTION: This exam was performed according to the departmental dose-optimization program which includes automated exposure control, adjustment of the mA and/or kV according to patient size and/or use of iterative reconstruction technique.  COMPARISON:  CT pelvis 01/11/2024 and CT abdomen pelvis 11/14/2023  FINDINGS: Lower chest: Ground-glass opacities and interlobular septal thickening in the lower lungs compatible with edema. Marked cardiomegaly. Small pericardial effusion.  Hepatobiliary: Unremarkable noncontrast appearance of the liver. Pericholecystic fluid is present. No biliary dilation radiopaque stone  Pancreas: Unremarkable.  Spleen: Unremarkable.  Adrenals/Urinary Tract: Normal adrenal glands. Scarring the left kidney. No urinary calculi or hydronephrosis. Unremarkable bladder.  Stomach/Bowel: Normal caliber large and small bowel. No bowel wall thickening. Stomach and appendix are within normal limits.  Vascular/Lymphatic: Aortic atherosclerosis. No enlarged abdominal or pelvic lymph nodes.  Reproductive: Uterus and bilateral adnexa are unremarkable.  Other: Small volume low-density free fluid in the pelvis. Mesenteric edema. No  free intraperitoneal air.  Musculoskeletal: Dystrophic calcifications in gluteal soft tissue. No acute fracture. Chronic L5 spondylolysis without spondylolisthesis.  IMPRESSION: 1. Marked cardiomegaly with pulmonary edema, small pericardial effusion, and small volume ascites. 2. Pericholecystic fluid is favored due to third spacing of fluid. If there is concern for acute cholecystitis, consider further evaluation with right upper quadrant ultrasound. 3. Aortic Atherosclerosis (ICD10-I70.0).   Electronically Signed   By: Rozell Cornet M.D.   On: 02/08/2024 19:14         CT showed marked cardiomegaly and pulmonary edema as well as some pericholecystic fluid which could be related to need for dialysis but they cannot rule out acute cholecystitis.  Patient does have significant tenderness in this area.  She was given further pain control, Protonix  and right upper quadrant ultrasound is pending.  LFTs are within normal limits. RUQ u/s without acute findings.  Spoke with Dr. Christianne Cowper with nephrology who will see the pt for dialysis.  Consulted hospitalist for admission.   Almond Army, MD 02/08/24 2129

## 2024-02-08 NOTE — Hospital Course (Signed)
 Linda [name] is a 28 y.o. female with medical history significant for ESRD on MWF HD, HFmrEF, discoid lupus, HTN, AoCD, calciphylaxis with chronic pain and chronic lesions to buttocks who is admitted with acute on chronic HFmrEF in setting of missed dialysis.

## 2024-02-08 NOTE — ED Provider Notes (Signed)
 [place] EMERGENCY DEPARTMENT AT [facility] Provider Note   CSN: [id number] Arrival date & time: 02/08/24  1421     History  No chief complaint on file.   Linda [name] is a 28 y.o. female.  28 yo F with a chief complaints of not feeling well.  Going on at least since yesterday.  Did not go to dialysis.  She denies any other missed dialysis sessions.  Typically goes Monday Wednesday and Friday.  She has not really been eating and drinking the past couple days.  Describes diffuse abdominal discomfort.  Denies cough congestion or fever.        Home Medications Prior to Admission medications   Medication Sig Start Date End Date Taking? Authorizing Provider  amLODipine  (NORVASC ) 10 MG tablet Take 10 mg by mouth at bedtime. 08/05/21   [provider]  calcitRIOL  (ROCALTROL ) 0.25 MCG capsule Take 1 capsule (0.25 mcg total) by mouth daily. Patient not taking: Reported on 02/03/2024 01/31/24   Akula, Vijaya, MD  furosemide  (LASIX ) 80 MG tablet Take 1 tablet (80 mg total) by mouth daily. Patient taking differently: Take 160 mg by mouth at bedtime. 12/23/23   Deforest Fast, MD  irbesartan  (AVAPRO ) 300 MG tablet Take 1 tablet (300 mg total) by mouth daily. 02/03/24   Walker, Caitlin S, NP  lidocaine -prilocaine  (EMLA ) cream Apply 1 Application topically once. 01/14/24   [provider]  medroxyPROGESTERone  (DEPO-PROVERA ) 150 MG/ML injection Inject 1 mL (150 mg total) into the muscle every 3 (three) months. 10/29/22   Gabrielle Joiner, MD  naloxone [facility]) nasal spray 4 mg/0.1 mL Place 1 spray into the nose once. If needed for emergency overdose. Patient not taking: Reported on 02/03/2024    [provider]  nebivolol  (BYSTOLIC ) 10 MG tablet Take 1 tablet (10 mg total) by mouth daily. 02/03/24   Walker, Caitlin S, NP  nitroGLYCERIN  (NITROSTAT ) 0.4 MG SL tablet Place 1 tablet (0.4 mg total) under the tongue every 5 (five) minutes as needed for chest  pain. Patient not taking: Reported on 02/03/2024 10/28/23   Rai, Hurman Maiden, MD  ondansetron  (ZOFRAN -ODT) 4 MG disintegrating tablet Take 1 tablet (4 mg total) by mouth every 8 (eight) hours as needed for nausea or vomiting. Patient not taking: Reported on 02/03/2024 10/28/23   Rai, Hurman Maiden, MD  senna-docusate (SENOKOT-S) 8.6-50 MG tablet Take 1 tablet by mouth 2 (two) times daily between meals as needed for mild constipation. Patient not taking: Reported on 02/03/2024 01/14/24   Gonfa, Taye T, MD  sevelamer  carbonate (RENVELA ) 2.4 g PACK Take 2.4 g by mouth 2 (two) times daily with a meal. 01/21/24   [provider]  tiZANidine  (ZANAFLEX ) 4 MG tablet Take 4 mg by mouth every 8 (eight) hours as needed for muscle spasms. 12/03/23   [provider]  valACYclovir  (VALTREX ) 1000 MG tablet Take 1 tablet (1,000 mg total) by mouth daily. Take for 5 days Patient not taking: Reported on 02/03/2024 02/02/24   Zelma Hidden, FNP      Allergies    Iodine , Lisinopril , Gabapentin, Ativan  [lorazepam ], Hydroxychloroquine , and Vicodin [hydrocodone -acetaminophen ]    Review of Systems   Review of Systems  Physical Exam Updated Vital Signs BP (!) 185/148   Pulse 95   Temp 98 F (36.7 C) (Oral)   Resp 16   LMP  (LMP Unknown) Comment: injection  SpO2 100%  Physical Exam Vitals and nursing note reviewed.  Constitutional:      General:  She is not in acute distress.    Appearance: She is well-developed. She is not diaphoretic.  HENT:     Head: Normocephalic and atraumatic.  Eyes:     Pupils: Pupils are equal, round, and reactive to light.  Cardiovascular:     Rate and Rhythm: Normal rate and regular rhythm.     Heart sounds: No murmur heard.    No friction rub. No gallop.  Pulmonary:     Effort: Pulmonary effort is normal.     Breath sounds: No wheezing or rales.  Abdominal:     General: There is no distension.     Palpations: Abdomen is soft.     Tenderness: There is no abdominal  tenderness.  Musculoskeletal:        General: No tenderness.     Cervical back: Normal range of motion and neck supple.     Comments: Left AV fistula with palpable thrill  Skin:    General: Skin is warm and dry.  Neurological:     Mental Status: She is alert and oriented to person, place, and time.  Psychiatric:        Behavior: Behavior normal.     ED Results / Procedures / Treatments   Labs (all labs ordered are listed, but only abnormal results are displayed) Labs Reviewed  CBC WITH DIFFERENTIAL/PLATELET - Abnormal; Notable for the following components:      Result Value   RBC 3.33 (*)    Hemoglobin 9.6 (*)    HCT 32.3 (*)    MCHC 29.7 (*)    RDW 20.1 (*)    All other components within normal limits  COMPREHENSIVE METABOLIC PANEL WITH GFR - Abnormal; Notable for the following components:   Sodium 134 (*)    CO2 20 (*)    Glucose, Bld 113 (*)    BUN 53 (*)    Creatinine, Ser 11.84 (*)    Albumin  3.4 (*)    GFR, Estimated 4 (*)    Anion gap 16 (*)    All other components within normal limits  CBG MONITORING, ED - Abnormal; Notable for the following components:   Glucose-Capillary 30 (*)    All other components within normal limits  CBG MONITORING, ED - Abnormal; Notable for the following components:   Glucose-Capillary 44 (*)    All other components within normal limits  CBG MONITORING, ED - Abnormal; Notable for the following components:   Glucose-Capillary 124 (*)    All other components within normal limits  TROPONIN I (HIGH SENSITIVITY) - Abnormal; Notable for the following components:   Troponin I (High Sensitivity) 42 (*)    All other components within normal limits  LIPASE, BLOOD  BRAIN NATRIURETIC PEPTIDE  HCG, SERUM, QUALITATIVE  I-STAT CHEM 8, ED    EKG EKG Interpretation Date/Time:  Tuesday February 08 2024 15:24:26 EDT Ventricular Rate:  95 PR Interval:  184 QRS Duration:  98 QT Interval:  392 QTC Calculation: 493 R Axis:   67  Text  Interpretation: Sinus rhythm Biatrial enlargement Left ventricular hypertrophy No significant change since last tracing Confirmed by Albertus Hughs [phone]) on 02/08/2024 3:56:55 PM  Radiology DG Chest Port 1 View Result Date: 02/08/2024 CLINICAL DATA:  Fatigue, dizziness. EXAM: PORTABLE CHEST 1 VIEW COMPARISON:  Jan 28, 2024. FINDINGS: Stable cardiomegaly. Both lungs are clear. The visualized skeletal structures are unremarkable. IMPRESSION: No active disease. Electronically Signed   By: Rosalene Colon M.D.   On: 02/08/2024 15:09  Procedures .Critical Care  Performed by: Albertus Hughs, DO Authorized by: Albertus Hughs, DO   Critical care provider statement:    Critical care time (minutes):  35   Critical care time was exclusive of:  Separately billable procedures and treating other patients   Critical care was time spent personally by me on the following activities:  Development of treatment plan with patient or surrogate, discussions with consultants, evaluation of patient's response to treatment, examination of patient, ordering and review of laboratory studies, ordering and review of radiographic studies, ordering and performing treatments and interventions, pulse oximetry, re-evaluation of patient's condition and review of old charts   Care discussed with: admitting provider       Medications Ordered in ED Medications  promethazine  (PHENERGAN ) injection 25 mg (25 mg Intramuscular Given 02/08/24 1504)  HYDROmorphone  (DILAUDID ) injection 0.5 mg (0.5 mg Intravenous Given 02/08/24 1504)  dextrose  50 % solution 50 mL (50 mLs Intravenous Given 02/08/24 1504)  HYDROmorphone  (DILAUDID ) injection 0.5 mg (0.5 mg Intravenous Given 02/08/24 1553)    ED Course/ Medical Decision Making/ A&P                                 Medical Decision Making Amount and/or Complexity of Data Reviewed Labs: ordered. Radiology: ordered. ECG/medicine tests: ordered.  Risk Prescription drug management.   28 yo F  with a chief complaint of not feeling well.  Going on at least since yesterday.  She missed dialysis yesterday due to the same.  She has been having diffuse abdominal discomfort.  Has not really wanted to eat or drink.  She has a benign abdominal exam for me.  Initial blood glucose in the 60s with EMS.  Down in the 30s on our evaluation.  She denies taking any medication for diabetes.  Will obtain a laboratory evaluation.  Reassess.  No acute anemia.  Potassium without significant elevation.   CXR without significant fluid overload.   Patient care signed out to Dr. Leida Puna, please see their note for further details of care in the ED.  The patients results and plan were reviewed and discussed.   Any x-rays performed were independently reviewed by myself.   Differential diagnosis were considered with the presenting HPI.  Medications  promethazine  (PHENERGAN ) injection 25 mg (25 mg Intramuscular Given 02/08/24 1504)  HYDROmorphone  (DILAUDID ) injection 0.5 mg (0.5 mg Intravenous Given 02/08/24 1504)  dextrose  50 % solution 50 mL (50 mLs Intravenous Given 02/08/24 1504)  HYDROmorphone  (DILAUDID ) injection 0.5 mg (0.5 mg Intravenous Given 02/08/24 1553)    Vitals:   02/08/24 1429 02/08/24 1515 02/08/24 1530  BP: (!) 163/122  (!) 185/148  Pulse: 97 95   Resp: 20 17 16   Temp: 98 F (36.7 C)    TempSrc: Oral    SpO2: 98% 100%     Final diagnoses:  Generalized abdominal pain  Hypoglycemia           Final Clinical Impression(s) / ED Diagnoses Final diagnoses:  Generalized abdominal pain  Hypoglycemia    Rx / DC Orders ED Discharge Orders     None         Albertus Hughs, DO 02/08/24 1628

## 2024-02-08 NOTE — ED Notes (Signed)
Pt reports increase in pain

## 2024-02-08 NOTE — Telephone Encounter (Signed)
 Copied from CRM [id number] [id number]. Topic: Referral - Question >> Feb 08, 2024 11:45 AM Adonis Hoot wrote: Reason for CRM: Patient called in stating that [facility] doesn't accept her insurance,and she doesn't want to go to [facility] ,she doesn't like them.  She stated that [facility] [place] keeps denying her because she doesn't have an official diagnosis. She needs referrals to pain management and to a different surgery place. She stated that she is willing to go to another city for pain management. She stated that she is in so much pain  In her bottom area,and would like to know if she's able to be prescribed any pain medicine?

## 2024-02-08 NOTE — H&P (Signed)
 History and Physical    Linda [name] ZOX:[id number] DOB: Dec 01, 1995 DOA: 02/08/2024  PCP: Francenia Ingle, NP  Patient coming from: Home  I have personally briefly reviewed patient's old medical records in [facility]  Chief Complaint: Shortness of breath, upper abdominal pain, buttocks lesions  HPI: Linda [name] is a 28 y.o. female with medical history significant for ESRD on MWF HD, HFmrEF, discoid lupus, HTN, AoCD, calciphylaxis with chronic pain and chronic lesions to buttocks who presented to the ED for evaluation of shortness of breath, upper abdominal pain, chronic buttocks lesions.  Patient states that she has missed her last 2 dialysis sessions, reportedly last HD was on Friday 6/6.  She says that over the last 3 days she has had worsening shortness of breath.  She has cough with minimal sputum production.  She says she still makes small amount of urine.  Over the last day or 2 she has had epigastric abdominal pain with some nausea and vomiting.  She reports having frequent bowel movements but no diarrhea.  She also notes chronic lesions to her buttocks and feels like her the one on her right buttocks has been enlarging and become more painful.  ED Course  Labs/Imaging on admission: I have personally reviewed following labs and imaging studies.  Initial vitals showed BP 163/122, pulse 97, RR 20, temp 98.0 F, SpO2 98% on room air.  Labs showed initial CBG 30.  WBC 7.4, hemoglobin 9.6, platelets 219.  Serum glucose 113 after receiving 1 amp D50, BUN 53, creatinine 11.84, sodium 134, potassium 4.6, bicarb 20, LFTs within normal limits, lipase 28, troponin 42, BNP >4500.  Serum hCG negative.  Portable chest x-ray negative for focal consolidation, edema, effusion.  CT abdomen/pelvis without contrast showed marked cardiomegaly with pulmonary edema, small pericardial effusion, small volume ascites.  Pericholecystic fluid seen in favor due to third spacing of  fluid.  RUQ ultrasound negative for gallstones or biliary dilatation.  Trace fluid adjacent to the gallbladder is seen and nonspecific.  No definite sonographic evidence for acute cholecystitis.  Patient was given 1 amp D50, IV Dilaudid  0.5 mg x 3, IV Protonix  40 mg, IV Phenergan .  EDP discussed with nephrology (Dr. Christianne Cowper) who will see in consultation for dialysis needs.  The hospitalist service was consulted to admit.  Review of Systems: All systems reviewed and are negative except as documented in history of present illness above.   Past Medical History:  Diagnosis Date   Abnormal ECG    a. In setting of LVH w/ repolarization abnormalities; b. 03/2019 MV: No ischemia/infarct. EF 55-65%.   Angioedema    ESRD on hemodialysis (HCC)    GERD (gastroesophageal reflux disease)    Hypertension    Lupus    LVH (left ventricular hypertrophy)    a. 11/2017 Echo: EF 50-55%. triv AI. Mild MR. Mod L and mild R atrial enlargement. Nl RV size/fxn. Small pericardial effusion w/o tampondae; b. 03/2018 Echo: EF 55-60%, restrictive filling pattern. Nl RV size/fxn. Sev LAE. Mild MR, mild to mod TR/PR. Mod PAH. Triv effusion.   Migraines    Mixed connective tissue disease (HCC)    Previous Dialysis patient [facility])    a. Off HD since late 2020.   Septic prepatellar bursitis of right knee 08/19/2017    Past Surgical History:  Procedure Laterality Date   GRAFT APPLICATION Left 03/30/2019   Procedure: BRACHIAL CEPHALIC GRAFT CREATION;  Surgeon: Celso College, MD;  Location: [facility] ORS;  Service: Vascular;  Laterality: Left;   I & D EXTREMITY Right 08/06/2017   Procedure: IRRIGATION AND DEBRIDEMENT KNEE;  Surgeon: Laneta Pintos, MD;  Location: [facility] OR;  Service: Orthopedics;  Laterality: Right;   IR THROMBECTOMY AV FISTULA W/THROMBOLYSIS/PTA/STENT INC/SHUNT/IMG LT Left 05/18/2022   IR US  GUIDE VASC ACCESS LEFT  05/18/2022   MULTIPLE TOOTH EXTRACTIONS      Social History: Social History   Tobacco Use   Smoking  status: Every Day    Current packs/day: 0.25    Average packs/day: 0.3 packs/day for 9.0 years (2.2 ttl pk-yrs)    Types: E-cigarettes, Cigarettes    Start date: 02/11/2015   Smokeless tobacco: Never   Tobacco comments:    02/03/2024 Patient smokes 3 cigarettes daily  Vaping Use   Vaping status: Every Day  Substance Use Topics   Alcohol use: No    Alcohol/week: 0.0 standard drinks of alcohol   Drug use: No   Allergies  Allergen Reactions   Iodine  Hives and Shortness Of Breath   Lisinopril  Anaphylaxis    Angioedema   Gabapentin Other (See Comments)    Reaction: Tremor (intolerance); tremor   Ativan  [Lorazepam ] Other (See Comments)    Somnolent with 1mg  ativan  at [name] Nov 2019. Patient stated it made her feel like she shut down and was mute and non-responding.   Hydroxychloroquine  Other (See Comments)    Pt reports making her skin peel really bad   Vicodin [Hydrocodone -Acetaminophen ] Itching and Rash    Family History  Problem Relation Age of Onset   Lupus Mother    Hypertension Mother    Kidney disease Mother    Heart Problems Mother    Coronary artery disease Mother 36       stent   Diabetes Maternal Grandmother      Prior to Admission medications   Medication Sig Start Date End Date Taking? Authorizing Provider  amLODipine  (NORVASC ) 10 MG tablet Take 10 mg by mouth at bedtime. 08/05/21   [provider]  calcitRIOL  (ROCALTROL ) 0.25 MCG capsule Take 1 capsule (0.25 mcg total) by mouth daily. Patient not taking: Reported on 02/03/2024 01/31/24   Akula, Vijaya, MD  furosemide  (LASIX ) 80 MG tablet Take 1 tablet (80 mg total) by mouth daily. Patient taking differently: Take 160 mg by mouth at bedtime. 12/23/23   Deforest Fast, MD  irbesartan  (AVAPRO ) 300 MG tablet Take 1 tablet (300 mg total) by mouth daily. 02/03/24   Walker, Caitlin S, NP  lidocaine -prilocaine  (EMLA ) cream Apply 1 Application topically once. 01/14/24   [provider]   medroxyPROGESTERone  (DEPO-PROVERA ) 150 MG/ML injection Inject 1 mL (150 mg total) into the muscle every 3 (three) months. 10/29/22   Gabrielle Joiner, MD  naloxone [facility]) nasal spray 4 mg/0.1 mL Place 1 spray into the nose once. If needed for emergency overdose. Patient not taking: Reported on 02/03/2024    [provider]  nebivolol  (BYSTOLIC ) 10 MG tablet Take 1 tablet (10 mg total) by mouth daily. 02/03/24   Clearnce Curia, NP  nitroGLYCERIN  (NITROSTAT ) 0.4 MG SL tablet Place 1 tablet (0.4 mg total) under the tongue every 5 (five) minutes as needed for chest pain. Patient not taking: Reported on 02/03/2024 10/28/23   Rai, Hurman Maiden, MD  ondansetron  (ZOFRAN -ODT) 4 MG disintegrating tablet Take 1 tablet (4 mg total) by mouth every 8 (eight) hours as needed for nausea or vomiting. Patient not taking: Reported on 02/03/2024 10/28/23   Loma Rising, MD  senna-docusate (SENOKOT-S) 8.6-50  MG tablet Take 1 tablet by mouth 2 (two) times daily between meals as needed for mild constipation. Patient not taking: Reported on 02/03/2024 01/14/24   Gonfa, Taye T, MD  sevelamer  carbonate (RENVELA ) 2.4 g PACK Take 2.4 g by mouth 2 (two) times daily with a meal. 01/21/24   [provider]  tiZANidine  (ZANAFLEX ) 4 MG tablet Take 4 mg by mouth every 8 (eight) hours as needed for muscle spasms. 12/03/23   [provider]  valACYclovir  (VALTREX ) 1000 MG tablet Take 1 tablet (1,000 mg total) by mouth daily. Take for 5 days Patient not taking: Reported on 02/03/2024 02/02/24   Zelma Hidden, FNP    Physical Exam: Vitals:   02/08/24 1845 02/08/24 2011 02/08/24 2130 02/08/24 2230  BP: (!) 164/135 (!) 172/127 (!) 152/114 131/76  Pulse:  93 84   Resp: (!) 31 (!) 24 19 (!) 23  Temp:  98 F (36.7 C)    TempSrc:      SpO2:  100% 96%    Constitutional: Resting in bed with head elevated.  Anxious Eyes: EOMI, lids and conjunctivae normal ENMT: Mucous membranes are moist. Posterior pharynx clear of  any exudate or lesions.Normal dentition.  Neck: normal, supple, no masses. Respiratory: Bibasilar inspiratory crackles.  Normal respiratory effort. No accessory muscle use.  Cardiovascular: Regular rate and rhythm, no murmurs / rubs / gallops. No extremity edema.  LUE AVF with palpable thrill. Abdomen: Mild epigastric tenderness, no masses palpated. Musculoskeletal: no clubbing / cyanosis. No joint deformity upper and lower extremities. Good ROM, no contractures. Normal muscle tone.  Skin: Indurated lesion approximately 7 x 6 cm right buttocks, tender to palpation, no open wound or active discharge.  Smaller and similar appearing lesion on left buttocks.  Suspect calciphylaxis lesions.  Chaperone present during exam. Neurologic: Somewhat stuttering speech and RUE tremor.  Sensation intact. Strength equal bilaterally. Psychiatric: Anxious mood. Alert and oriented x 3. Normal mood.   EKG: Personally reviewed. Sinus rhythm, rate 95, biatrial enlargement, LVH.  No acute ischemic changes.  Assessment/Plan Principal Problem:   Acute on chronic heart failure with mildly reduced ejection fraction (HFmrEF, 41-49%) (HCC) Active Problems:   ESRD (end stage renal disease) on dialysis (HCC)   Hypoglycemia   Abdominal pain   Essential hypertension   Calciphylaxis   Anemia of chronic disease   Linda [name] is a 28 y.o. female with medical history significant for ESRD on MWF HD, HFmrEF, discoid lupus, HTN, AoCD, calciphylaxis with chronic pain and chronic lesions to buttocks who is admitted with acute on chronic HFmrEF in setting of missed dialysis.  Assessment and Plan: Acute on chronic HFmrEF with pulmonary edema and small pericardial effusion ESRD on MWF HD: Patient presenting with progressive shortness of breath, pulmonary edema, BNP >4500 with likely uremic symptoms in setting of missed HD.  Per patient last HD was on Friday 6/6.  She was otherwise saturating well while on room air. -  Nephrology consult for dialysis tomorrow - Admit to PCU and keep on telemetry - Continue irbesartan  and Nebivolol  - Supplemental oxygen as needed  Abdominal pain/nausea/vomiting: Suspect this is related to uremia and intra-abdominal edema.  Pericholecystic fluid seen on imaging is likely from third spacing.  No evidence of cholecystitis.  Continue supportive care.  Hypoglycemia: Hypoglycemic on arrival.  Suspect due to diminished oral intake.  Improved with D50.  Monitor CBG q2h x3 and give D50 prn.  Hypertension: Continue amlodipine , irbesartan , Nebivolol .  Anemia of chronic disease: Hemoglobin stable  at 9.6.  Calciphylaxis with chronic bilateral buttocks lesions: Patient with chronic buttocks lesions, suspicion is calciphylaxis related lesions.  No evidence of active superimposed infection.  She was evaluated by surgery in hospital last month and it was felt that biopsies would be low yield and there would be difficulty in healing if these were due to calciphylaxis.  Will continue stepwise pain control overnight.   DVT prophylaxis: heparin  injection 5,000 Units Start: 02/08/24 2245 Code Status: Full code Family Communication: Discussed with patient, she is discussed with family Disposition Plan: From home, dispo pending clinical progress Consults called: Nephrology Severity of Illness: The appropriate patient status for this patient is OBSERVATION. Observation status is judged to be reasonable and necessary in order to provide the required intensity of service to ensure the patient's safety. The patient's presenting symptoms, physical exam findings, and initial radiographic and laboratory data in the context of their medical condition is felt to place them at decreased risk for further clinical deterioration. Furthermore, it is anticipated that the patient will be medically stable for discharge from the hospital within 2 midnights of admission.   Edith Gores MD [facility]  If  7PM-7AM, please contact night-coverage [email or web address]  02/08/2024, 11:06 PM

## 2024-02-08 NOTE — Telephone Encounter (Signed)
  FYI Only or Action Required?: Action required by provider  Patient was last seen in primary care on 01/26/2024 by Francenia Ingle, NP. Called Nurse Triage reporting Neurologic Problem. Symptoms began yesterday. Interventions attempted: Nothing. Symptoms are: Dizziness, slurring words, abdominal pain, bloatingunchanged.  Triage Disposition: See PCP When Office is Open (Within 3 Days)  Patient/caregiver understands and will follow disposition?: YesCopied from CRM [id number]. Topic: Clinical - Red Word Triage >> Feb 08, 2024 11:52 AM Adonis Hoot wrote: Red Word that prompted transfer to Nurse Triage: slurred speech ,started last night, dizziness, No BP cuff Reason for Disposition  [1] Loss of speech or garbled speech AND [2] is a chronic symptom (recurrent or ongoing AND present > 4 weeks)  Answer Assessment - Initial Assessment Questions 1. SYMPTOM: "What is the main symptom you are concerned about?" (e.g., weakness, numbness)     Slurring speech, dizzy 2. ONSET: "When did this start?" (minutes, hours, days; while sleeping)     Last night 3. LAST NORMAL: "When was the last time you (the patient) were normal (no symptoms)?"     Last night 4. PATTERN "Does this come and go, or has it been constant since it started?"  "Is it present now?"     Comes and goes 5. CARDIAC SYMPTOMS: "Have you had any of the following symptoms: chest pain, difficulty breathing, palpitations?"     no 6. NEUROLOGIC SYMPTOMS: "Have you had any of the following symptoms: headache, dizziness, vision loss, double vision, changes in speech, unsteady on your feet?"     Dizzy happened before 1 week ago 7. OTHER SYMPTOMS: "Do you have any other symptoms?"     no 8. PREGNANCY: "Is there any chance you are pregnant?" "When was your last menstrual period?"     no  Protocols used: Neurologic Deficit-A-AH

## 2024-02-08 NOTE — ED Notes (Signed)
CCMD notified

## 2024-02-08 NOTE — ED Notes (Signed)
 Pt given some crackers and something to drink.

## 2024-02-08 NOTE — ED Notes (Signed)
 Help get patient on the monitor patient is resting with call bell in reach

## 2024-02-08 NOTE — ED Triage Notes (Signed)
 Pt bib [facility] coming from home with complaints of dizziness, headache, abdominal distention.Pt did miss dialysis yesterday (MWF), stating she didn't go because she wasn't feeling well. Symptoms have been occurring for about 2-3days. Pt reports unsteady gait. Pt tearful during triage process.  EMS pre-tx and vitals:  64 cbg 15g oral glucose given by EMS 169/123 90 HR 12 lead unremarkable  91% RA

## 2024-02-08 NOTE — ED Notes (Signed)
 Pt given an update with plan of care (US ). Verbalizes understanding.

## 2024-02-08 NOTE — ED Notes (Signed)
 Pt taken to [place]

## 2024-02-08 NOTE — ED Notes (Signed)
Admit provider at bedside 

## 2024-02-08 NOTE — ED Notes (Signed)
 Pt reports increase of pain. 8/10

## 2024-02-09 DIAGNOSIS — M329 Systemic lupus erythematosus, unspecified: Secondary | ICD-10-CM | POA: Diagnosis present

## 2024-02-09 DIAGNOSIS — L988 Other specified disorders of the skin and subcutaneous tissue: Secondary | ICD-10-CM | POA: Diagnosis not present

## 2024-02-09 DIAGNOSIS — R188 Other ascites: Secondary | ICD-10-CM | POA: Diagnosis present

## 2024-02-09 DIAGNOSIS — K219 Gastro-esophageal reflux disease without esophagitis: Secondary | ICD-10-CM | POA: Diagnosis not present

## 2024-02-09 DIAGNOSIS — Z91158 Patient's noncompliance with renal dialysis for other reason: Secondary | ICD-10-CM | POA: Diagnosis not present

## 2024-02-09 DIAGNOSIS — Z8269 Family history of other diseases of the musculoskeletal system and connective tissue: Secondary | ICD-10-CM | POA: Diagnosis not present

## 2024-02-09 DIAGNOSIS — N186 End stage renal disease: Secondary | ICD-10-CM | POA: Diagnosis not present

## 2024-02-09 DIAGNOSIS — Z833 Family history of diabetes mellitus: Secondary | ICD-10-CM | POA: Diagnosis not present

## 2024-02-09 DIAGNOSIS — Z885 Allergy status to narcotic agent status: Secondary | ICD-10-CM | POA: Diagnosis not present

## 2024-02-09 DIAGNOSIS — I10 Essential (primary) hypertension: Secondary | ICD-10-CM | POA: Diagnosis not present

## 2024-02-09 DIAGNOSIS — F17210 Nicotine dependence, cigarettes, uncomplicated: Secondary | ICD-10-CM | POA: Diagnosis present

## 2024-02-09 DIAGNOSIS — Z91041 Radiographic dye allergy status: Secondary | ICD-10-CM | POA: Diagnosis not present

## 2024-02-09 DIAGNOSIS — I132 Hypertensive heart and chronic kidney disease with heart failure and with stage 5 chronic kidney disease, or end stage renal disease: Secondary | ICD-10-CM | POA: Diagnosis present

## 2024-02-09 DIAGNOSIS — E8779 Other fluid overload: Secondary | ICD-10-CM | POA: Diagnosis present

## 2024-02-09 DIAGNOSIS — R112 Nausea with vomiting, unspecified: Secondary | ICD-10-CM | POA: Diagnosis not present

## 2024-02-09 DIAGNOSIS — M3214 Glomerular disease in systemic lupus erythematosus: Secondary | ICD-10-CM | POA: Diagnosis not present

## 2024-02-09 DIAGNOSIS — Z8249 Family history of ischemic heart disease and other diseases of the circulatory system: Secondary | ICD-10-CM | POA: Diagnosis not present

## 2024-02-09 DIAGNOSIS — G8929 Other chronic pain: Secondary | ICD-10-CM | POA: Diagnosis present

## 2024-02-09 DIAGNOSIS — N19 Unspecified kidney failure: Secondary | ICD-10-CM | POA: Insufficient documentation

## 2024-02-09 DIAGNOSIS — I120 Hypertensive chronic kidney disease with stage 5 chronic kidney disease or end stage renal disease: Secondary | ICD-10-CM | POA: Diagnosis not present

## 2024-02-09 DIAGNOSIS — I96 Gangrene, not elsewhere classified: Secondary | ICD-10-CM | POA: Diagnosis not present

## 2024-02-09 DIAGNOSIS — E162 Hypoglycemia, unspecified: Secondary | ICD-10-CM

## 2024-02-09 DIAGNOSIS — N250 Renal osteodystrophy: Secondary | ICD-10-CM | POA: Diagnosis not present

## 2024-02-09 DIAGNOSIS — F17290 Nicotine dependence, other tobacco product, uncomplicated: Secondary | ICD-10-CM | POA: Diagnosis present

## 2024-02-09 DIAGNOSIS — R0602 Shortness of breath: Secondary | ICD-10-CM | POA: Diagnosis not present

## 2024-02-09 DIAGNOSIS — E16A2 Hypoglycemia level 2: Secondary | ICD-10-CM | POA: Diagnosis present

## 2024-02-09 DIAGNOSIS — N2581 Secondary hyperparathyroidism of renal origin: Secondary | ICD-10-CM | POA: Diagnosis present

## 2024-02-09 DIAGNOSIS — R222 Localized swelling, mass and lump, trunk: Secondary | ICD-10-CM | POA: Diagnosis not present

## 2024-02-09 DIAGNOSIS — M1A9XX1 Chronic gout, unspecified, with tophus (tophi): Secondary | ICD-10-CM | POA: Diagnosis present

## 2024-02-09 DIAGNOSIS — Z888 Allergy status to other drugs, medicaments and biological substances status: Secondary | ICD-10-CM | POA: Diagnosis not present

## 2024-02-09 DIAGNOSIS — R109 Unspecified abdominal pain: Secondary | ICD-10-CM | POA: Diagnosis not present

## 2024-02-09 DIAGNOSIS — E8770 Fluid overload, unspecified: Secondary | ICD-10-CM | POA: Diagnosis present

## 2024-02-09 DIAGNOSIS — D631 Anemia in chronic kidney disease: Secondary | ICD-10-CM | POA: Diagnosis not present

## 2024-02-09 DIAGNOSIS — Z992 Dependence on renal dialysis: Secondary | ICD-10-CM | POA: Diagnosis not present

## 2024-02-09 DIAGNOSIS — I3139 Other pericardial effusion (noninflammatory): Secondary | ICD-10-CM | POA: Diagnosis present

## 2024-02-09 DIAGNOSIS — I5023 Acute on chronic systolic (congestive) heart failure: Secondary | ICD-10-CM | POA: Diagnosis present

## 2024-02-09 LAB — BASIC METABOLIC PANEL WITH GFR
Anion gap: 17 — ABNORMAL HIGH (ref 5–15)
BUN: 58 mg/dL — ABNORMAL HIGH (ref 6–20)
CO2: 17 mmol/L — ABNORMAL LOW (ref 22–32)
Calcium: 8.5 mg/dL — ABNORMAL LOW (ref 8.9–10.3)
Chloride: 100 mmol/L (ref 98–111)
Creatinine, Ser: 11.99 mg/dL — ABNORMAL HIGH (ref 0.44–1.00)
GFR, Estimated: 4 mL/min — ABNORMAL LOW (ref >60)
Glucose, Bld: 140 mg/dL — ABNORMAL HIGH (ref 70–99)
Potassium: 4.6 mmol/L (ref 3.5–5.1)
Sodium: 134 mmol/L — ABNORMAL LOW (ref 135–145)

## 2024-02-09 LAB — CBC
HCT: 32.9 % — ABNORMAL LOW (ref 36.0–46.0)
Hemoglobin: 9.8 g/dL — ABNORMAL LOW (ref 12.0–15.0)
MCH: 29.6 pg (ref 26.0–34.0)
MCHC: 29.8 g/dL — ABNORMAL LOW (ref 30.0–36.0)
MCV: 99.4 fL (ref 80.0–100.0)
Platelets: 195 10*3/uL (ref 150–400)
RBC: 3.31 MIL/uL — ABNORMAL LOW (ref 3.87–5.11)
RDW: 19.9 % — ABNORMAL HIGH (ref 11.5–15.5)
WBC: 7.4 10*3/uL (ref 4.0–10.5)
nRBC: 0 % (ref 0.0–0.2)

## 2024-02-09 LAB — CBG MONITORING, ED
Glucose-Capillary: 116 mg/dL — ABNORMAL HIGH (ref 70–99)
Glucose-Capillary: 133 mg/dL — ABNORMAL HIGH (ref 70–99)
Glucose-Capillary: 35 mg/dL — CL (ref 70–99)
Glucose-Capillary: 41 mg/dL — CL (ref 70–99)
Glucose-Capillary: 47 mg/dL — ABNORMAL LOW (ref 70–99)
Glucose-Capillary: 70 mg/dL (ref 70–99)
Glucose-Capillary: 73 mg/dL (ref 70–99)
Glucose-Capillary: 75 mg/dL (ref 70–99)
Glucose-Capillary: 76 mg/dL (ref 70–99)
Glucose-Capillary: 77 mg/dL (ref 70–99)
Glucose-Capillary: 90 mg/dL (ref 70–99)

## 2024-02-09 LAB — GLUCOSE, CAPILLARY
Glucose-Capillary: 68 mg/dL — ABNORMAL LOW (ref 70–99)
Glucose-Capillary: 70 mg/dL (ref 70–99)
Glucose-Capillary: 61 mg/dL — ABNORMAL LOW (ref 70–99)
Glucose-Capillary: 91 mg/dL (ref 70–99)
Glucose-Capillary: 100 mg/dL — ABNORMAL HIGH (ref 70–99)
Glucose-Capillary: 78 mg/dL (ref 70–99)

## 2024-02-09 LAB — HEPATITIS B SURFACE ANTIGEN: Hepatitis B Surface Ag: NONREACTIVE

## 2024-02-09 MED ORDER — HYDROMORPHONE HCL 1 MG/ML IJ SOLN
INTRAMUSCULAR | Status: AC
  Filled 2024-02-09: qty 0.5

## 2024-02-09 MED ORDER — GLUCOSE 40 % PO GEL
1.0000 | ORAL | Status: AC
  Administered 2024-02-09: 31 g via ORAL
  Filled 2024-02-09: qty 1.21

## 2024-02-09 MED ORDER — DEXTROSE 50 % IV SOLN
INTRAVENOUS | Status: AC
Start: 2024-02-09 — End: 2024-02-09
  Filled 2024-02-09: qty 50

## 2024-02-09 MED ORDER — TIZANIDINE HCL 2 MG PO TABS
2.0000 mg | ORAL_TABLET | Freq: Once | ORAL | Status: AC
  Administered 2024-02-09: 2 mg via ORAL
  Filled 2024-02-09: qty 1

## 2024-02-09 MED ORDER — DEXTROSE 50 % IV SOLN
1.0000 | Freq: Once | INTRAVENOUS | Status: AC
  Administered 2024-02-09: 50 mL via INTRAVENOUS

## 2024-02-09 MED ORDER — DEXTROSE 10 % IV SOLN: INTRAVENOUS | Status: DC

## 2024-02-09 NOTE — Assessment & Plan Note (Signed)
 Patient had HD today with improvement in volume status but not yet back to baseline.   Follow up with nephrology for further inpatient renal replacement therapies.   Metabolic bone disease, continue with sevelamer .   Calciphylaxis, corrected calcium  for albumin  is 9.0  Continue analgesics and follow up with nephrology recommendations.

## 2024-02-09 NOTE — ED Notes (Signed)
 Patient resting in bed, drinking ginger ale

## 2024-02-09 NOTE — ED Notes (Signed)
 Report given to Harrold Donath, [place]

## 2024-02-09 NOTE — ED Notes (Signed)
 IV team at bedside

## 2024-02-09 NOTE — Progress Notes (Signed)
 Heart Failure Navigator Progress Note  Assessed for Heart & Vascular TOC clinic readiness.  Patient does not meet criteria due to ESRD on hemodialysis.   Navigator will sign off at this time.   Rhae Hammock, BSN, Scientist, clinical (histocompatibility and immunogenetics) Only

## 2024-02-09 NOTE — Progress Notes (Signed)
 Progress Note   Patient: Linda [name] ZOX:[id number] DOB: [birth year] DOA: 02/08/2024     0 DOS: the patient was seen and examined on 02/09/2024   Brief hospital course: Mrs. Elza was admitted to the hospital with the working diagnosis of volume overload in the setting of ESRD.   28 y.o. female with medical history significant for ESRD on MWF HD, heart failure, discoid lupus, HTN, AoCD, calciphylaxis with chronic pain and chronic lesions to buttocks who presented with dyspnea.  Patient missed last 2 HD sessions prior to admission, last session was 02/04/24. She developed progressive dyspnea, with cough, nausea and vomiting. Worsening painful calciphylaxis lesions in her buttocks.  On her initial physical examination her blood pressure was 163/122, HR 97, RR 20 and 02 saturation 98% on room air.  Lungs with rales bilaterally with no wheezing, heart with S1 and S2 presnet and rhythmic with no gallops or rubs, abdomen with no distention, no lower extremity edema.  Round hard nodules, subcutaneous, bilateral buttocks.   Na 134, K 4,6 Cl 98 bicarbonate 20 glucose 113 bun 53 cr 11,84  AST 23 ALT 14  BNP > 4,500  High sensitive troponin 42  Wbc 7,4 hgb 9.6 plt 219   Chest radiograph with cardiomegaly and bilateral hilar vascular congestion, with no infiltrates or effusions.   CT abdomen and pelvis with cardiomegaly and pulmonary edema, small pericardial effusion, and small volume ascites.  Pericholecystic fluid is favored due to third spacing of fluid,   EKG 96 bpm, normal axis, normal intervals, qtc 471, sinus rhythm with bilateral atrial enlargement, with no significant ST segment or T wave changes, positive LVH.   06/11 renal replacement therapy with good toleration.    Assessment and Plan: * ESRD (end stage renal disease) on dialysis [facility]) Patient had HD today with improvement in volume status but not yet back to baseline.   Follow up with nephrology for further inpatient  renal replacement therapies.   Metabolic bone disease, continue with sevelamer .   Calciphylaxis, corrected calcium  for albumin  is 9.0  Continue analgesics and follow up with nephrology recommendations.   Essential hypertension Continue blood pressure control with amlodipine  and irbesartan .    Hypoglycemia Change diet to regular, implemented hypoglycemic protocol.    Systemic lupus erythematosus (HCC) No signs of active disease. Patient not on immunosuppressive therapy.   GERD without esophagitis Continue pantoprazole .      Subjective: Patient with improvement in dyspnea post HD. Continue to have very painful lesions in her buttocks. Nausea and vomiting are improving, no abdominal pain.   Physical Exam: Vitals:   02/09/24 1206 02/09/24 1207 02/09/24 1302 02/09/24 1627  BP: (!) 169/122 (!) 167/117 (!) 158/112 (!) 149/110  Pulse: 88 90  96  Resp: (!) 21 (!) 24 19 20   Temp:  98.3 F (36.8 C)  98 F (36.7 C)  TempSrc:    Oral  SpO2: 100%  98% 97%  Weight:       Neurology awake and alert ENT with mild pallor with no icterus Cardiovascular with S1 and S2 present and regular with no gallops, rubs or murmurs Respiratory with no rales or wheezing, no rhonchi Abdomen with no distention  No lower extremity edema.  Positive subcutaneous nodules right forearm and bilateral buttocks.   Data Reviewed:    Family Communication: no family at the bedside   Disposition: Status is: Inpatient Remains inpatient appropriate because:  inpatient renal replacement therapy   Planned Discharge Destination: Home     Author: Ixchel Duck  Susann Eon, MD 02/09/2024 4:43 PM  For on call review [email or web address].

## 2024-02-09 NOTE — Assessment & Plan Note (Signed)
 Continue pantoprazole.

## 2024-02-09 NOTE — Plan of Care (Signed)

## 2024-02-09 NOTE — Progress Notes (Signed)
 D50 solution stopped per Jacobo Masters NP, Nephology

## 2024-02-09 NOTE — Consult Note (Signed)
 [facility] Renal Consultation Note    Indication for Consultation:  Management of ESRD/hemodialysis; anemia, hypertension/volume and secondary hyperparathyroidism PCP: Rebecca Campus NP Nephrologist: Dr. Zelda Hickman  HPI: Linda [name] is a 28 y.o. female with ESRD on hemodialysis MWF at [facility]. PMH: Lupus, HTN, medical noncompliance. Last HD 02/04/2024. She has been admitted as observation patient for SOB, epigastric pain and nausea. Labs are not horrible, doubt nausea is related to uremia. SCr 11.84 BUN 58 K+ 4.6 on adm. CXR unremarkable. She has had better attendance at OP HD unit recently but persistent high gains. Dx of calciphylaxis on chart but she is not on sodium Sodium Thiosulfate-previously ordered but pt refused.  Seen at bedside. She is crying, speech slurred. She is on D10W for low BS but BS. She says her stomach hurts, her buttocks hurt. Hardened dark area on R buttock suspicious for calciphylaxis but she is still refusing sodium thio. She says she is tired of hurting, tired of pain.  She has been admitted as OP pt for volume overload and abdominal pain.  We will order and manage dialysis for patient.   Past Medical History:  Diagnosis Date   Abnormal ECG    a. In setting of LVH w/ repolarization abnormalities; b. 03/2019 MV: No ischemia/infarct. EF 55-65%.   Angioedema    ESRD on hemodialysis (HCC)    GERD (gastroesophageal reflux disease)    Hypertension    Lupus    LVH (left ventricular hypertrophy)    a. 11/2017 Echo: EF 50-55%. triv AI. Mild MR. Mod L and mild R atrial enlargement. Nl RV size/fxn. Small pericardial effusion w/o tampondae; b. 03/2018 Echo: EF 55-60%, restrictive filling pattern. Nl RV size/fxn. Sev LAE. Mild MR, mild to mod TR/PR. Mod PAH. Triv effusion.   Migraines    Mixed connective tissue disease (HCC)    Previous Dialysis patient [facility])    a. Off HD since late 2020.   Septic prepatellar bursitis of right  knee 08/19/2017   Past Surgical History:  Procedure Laterality Date   GRAFT APPLICATION Left 03/30/2019   Procedure: BRACHIAL CEPHALIC GRAFT CREATION;  Surgeon: Celso College, MD;  Location: [facility] ORS;  Service: Vascular;  Laterality: Left;   I & D EXTREMITY Right 08/06/2017   Procedure: IRRIGATION AND DEBRIDEMENT KNEE;  Surgeon: Laneta Pintos, MD;  Location: [facility] OR;  Service: Orthopedics;  Laterality: Right;   IR THROMBECTOMY AV FISTULA W/THROMBOLYSIS/PTA/STENT INC/SHUNT/IMG LT Left 05/18/2022   IR US  GUIDE VASC ACCESS LEFT  05/18/2022   MULTIPLE TOOTH EXTRACTIONS     Family History  Problem Relation Age of Onset   Lupus Mother    Hypertension Mother    Kidney disease Mother    Heart Problems Mother    Coronary artery disease Mother 16       stent   Diabetes Maternal Grandmother    Social History:  reports that she has been smoking e-cigarettes and cigarettes. She started smoking about 9 years ago. She has a 2.2 pack-year smoking history. She has never used smokeless tobacco. She reports that she does not drink alcohol and does not use drugs. Allergies  Allergen Reactions   Iodine  Hives and Shortness Of Breath   Lisinopril  Anaphylaxis    Angioedema   Gabapentin Other (See Comments)    Reaction: Tremor (intolerance); tremor   Ativan  [Lorazepam ] Other (See Comments)    Somnolent with 1mg  ativan  at [facility] Nov 2019. Patient stated it made her feel like she  shut down and was mute and non-responding.   Hydroxychloroquine  Other (See Comments)    Pt reports making her skin peel really bad   Vicodin [Hydrocodone -Acetaminophen ] Itching and Rash   Prior to Admission medications   Medication Sig Start Date End Date Taking? Authorizing Provider  amLODipine  (NORVASC ) 10 MG tablet Take 10 mg by mouth at bedtime. 08/05/21  Yes [provider]  furosemide  (LASIX ) 80 MG tablet Take 1 tablet (80 mg total) by mouth daily. Patient taking differently: Take 80 mg by mouth 3 (three) times  daily. 12/23/23  Yes Deforest Fast, MD  irbesartan  (AVAPRO ) 300 MG tablet Take 1 tablet (300 mg total) by mouth daily. 02/03/24  Yes Walker, Caitlin S, NP  lidocaine -prilocaine  (EMLA ) cream Apply 1 Application topically once. 01/14/24  Yes [provider]  medroxyPROGESTERone  (DEPO-PROVERA ) 150 MG/ML injection Inject 1 mL (150 mg total) into the muscle every 3 (three) months. 10/29/22  Yes Gabrielle Joiner, MD  nitroGLYCERIN  (NITROSTAT ) 0.4 MG SL tablet Place 1 tablet (0.4 mg total) under the tongue every 5 (five) minutes as needed for chest pain. 10/28/23  Yes Rai, Ripudeep K, MD  ondansetron  (ZOFRAN -ODT) 4 MG disintegrating tablet Take 1 tablet (4 mg total) by mouth every 8 (eight) hours as needed for nausea or vomiting. 10/28/23  Yes Rai, Ripudeep K, MD  sevelamer  carbonate (RENVELA ) 2.4 g PACK Take 2.4 g by mouth 2 (two) times daily with a meal. 01/21/24  Yes [provider]  tiZANidine  (ZANAFLEX ) 4 MG tablet Take 4 mg by mouth every 8 (eight) hours as needed for muscle spasms. 12/03/23  Yes [provider]   Current Facility-Administered Medications  Medication Dose Route Frequency Provider Last Rate Last Admin   acetaminophen  (TYLENOL ) tablet 650 mg  650 mg Oral Q6H PRN Patel, Vishal R, MD       Or   acetaminophen  (TYLENOL ) suppository 650 mg  650 mg Rectal Q6H PRN Patel, Vishal R, MD       amLODipine  (NORVASC ) tablet 10 mg  10 mg Oral QHS Patel, Vishal R, MD   10 mg at 02/09/24 0010   bisacodyl (DULCOLAX) EC tablet 5 mg  5 mg Oral Daily PRN Patel, Vishal R, MD       Chlorhexidine  Gluconate Cloth 2 % PADS 6 each  6 each Topical Q0600 Leandra Pro, MD       dextrose  10 % infusion   Intravenous Continuous Angelene Kelly, MD 75 mL/hr at 02/09/24 0606 75 mL/hr at 02/09/24 0606   dextrose  50 % solution 50 mL  1 ampule Intravenous PRN Patel, Vishal R, MD   50 mL at 02/09/24 0428   heparin  injection 5,000 Units  5,000 Units Subcutaneous Q8H Patel, Vishal R, MD   5,000  Units at 02/09/24 1610   HYDROmorphone  (DILAUDID ) injection 0.5 mg  0.5 mg Intravenous Q2H PRN Patel, Vishal R, MD   0.5 mg at 02/09/24 9604   irbesartan  (AVAPRO ) tablet 300 mg  300 mg Oral Daily Patel, Vishal R, MD       oxyCODONE  (Oxy IR/ROXICODONE ) immediate release tablet 5-10 mg  5-10 mg Oral Q4H PRN Patel, Vishal R, MD       prochlorperazine (COMPAZINE) injection 10 mg  10 mg Intravenous Q6H PRN Patel, Vishal R, MD       senna-docusate (Senokot-S) tablet 1 tablet  1 tablet Oral QHS PRN Patel, Vishal R, MD       sevelamer  carbonate (RENVELA ) powder PACK 2.4 g  2.4 g Oral  BID WC Patel, Vishal R, MD       sodium chloride  flush (NS) 0.9 % injection 3 mL  3 mL Intravenous Q12H Kenny Peals, MD       Labs: Basic Metabolic Panel: Recent Labs  Lab 02/08/24 1451 02/09/24 0439  NA 134* 134*  K 4.6 4.6  CL 98 100  CO2 20* 17*  GLUCOSE 113* 140*  BUN 53* 58*  CREATININE 11.84* 11.99*  CALCIUM  9.1 8.5*   Liver Function Tests: Recent Labs  Lab 02/08/24 1451  AST 23  ALT 14  ALKPHOS 95  BILITOT 1.1  PROT 7.6  ALBUMIN  3.4*   Recent Labs  Lab 02/08/24 1451  LIPASE 28   No results for input(s): AMMONIA in the last 168 hours. CBC: Recent Labs  Lab 02/08/24 1451 02/09/24 0439  WBC 7.4 7.4  NEUTROABS 3.9  --   HGB 9.6* 9.8*  HCT 32.3* 32.9*  MCV 97.0 99.4  PLT 219 195   Cardiac Enzymes: No results for input(s): CKTOTAL, CKMB, CKMBINDEX, TROPONINI in the last 168 hours. CBG: Recent Labs  Lab 02/09/24 0504 02/09/24 0533 02/09/24 0600 02/09/24 0635 02/09/24 0722  GLUCAP 116* 75 41* 133* 77   Iron  Studies: No results for input(s): IRON , TIBC, TRANSFERRIN, FERRITIN in the last 72 hours. Studies/Results: US  Abdomen Limited RUQ (LIVER/GB) Addendum Date: 02/08/2024 ADDENDUM REPORT: 02/08/2024 22:01 ADDENDUM: Echogenic right kidney suggesting medical renal disease. No hydronephrosis Electronically Signed   By: Esmeralda Hedge M.D.   On: 02/08/2024 22:01    Result Date: 02/08/2024 CLINICAL DATA:  Right upper quadrant pain EXAM: ULTRASOUND ABDOMEN LIMITED RIGHT UPPER QUADRANT COMPARISON:  CT 02/08/2024, ultrasound 11/14/2023 FINDINGS: Gallbladder: No shadowing stones. Trace fluid adjacent to the gallbladder. Negative sonographic Murphy. Wall thickness within normal limits Common bile duct: Diameter: 2.2 mm Liver: No focal lesion identified. Within normal limits in parenchymal echogenicity. Portal vein is patent on color Doppler imaging with normal direction of blood flow towards the liver. Other: None. IMPRESSION: Negative for gallstones or biliary dilatation. Trace fluid adjacent to the gallbladder, nonspecific. No definite sonographic evidence for acute cholecystitis Electronically Signed: By: Esmeralda Hedge M.D. On: 02/08/2024 20:48   CT ABDOMEN PELVIS WO CONTRAST Result Date: 02/08/2024 CLINICAL DATA:  Acute nonlocalized abdominal pain EXAM: CT ABDOMEN AND PELVIS WITHOUT CONTRAST TECHNIQUE: Multidetector CT imaging of the abdomen and pelvis was performed following the standard protocol without IV contrast. RADIATION DOSE REDUCTION: This exam was performed according to the departmental dose-optimization program which includes automated exposure control, adjustment of the mA and/or kV according to patient size and/or use of iterative reconstruction technique. COMPARISON:  CT pelvis 01/11/2024 and CT abdomen pelvis 11/14/2023 FINDINGS: Lower chest: Ground-glass opacities and interlobular septal thickening in the lower lungs compatible with edema. Marked cardiomegaly. Small pericardial effusion. Hepatobiliary: Unremarkable noncontrast appearance of the liver. Pericholecystic fluid is present. No biliary dilation radiopaque stone Pancreas: Unremarkable. Spleen: Unremarkable. Adrenals/Urinary Tract: Normal adrenal glands. Scarring the left kidney. No urinary calculi or hydronephrosis. Unremarkable bladder. Stomach/Bowel: Normal caliber large and small bowel. No  bowel wall thickening. Stomach and appendix are within normal limits. Vascular/Lymphatic: Aortic atherosclerosis. No enlarged abdominal or pelvic lymph nodes. Reproductive: Uterus and bilateral adnexa are unremarkable. Other: Small volume low-density free fluid in the pelvis. Mesenteric edema. No free intraperitoneal air. Musculoskeletal: Dystrophic calcifications in gluteal soft tissue. No acute fracture. Chronic L5 spondylolysis without spondylolisthesis. IMPRESSION: 1. Marked cardiomegaly with pulmonary edema, small pericardial effusion, and small volume ascites. 2. Pericholecystic fluid is favored  due to third spacing of fluid. If there is concern for acute cholecystitis, consider further evaluation with right upper quadrant ultrasound. 3. Aortic Atherosclerosis (ICD10-I70.0). Electronically Signed   By: Rozell Cornet M.D.   On: 02/08/2024 19:14   DG Chest Port 1 View Result Date: 02/08/2024 CLINICAL DATA:  Fatigue, dizziness. EXAM: PORTABLE CHEST 1 VIEW COMPARISON:  Jan 28, 2024. FINDINGS: Stable cardiomegaly. Both lungs are clear. The visualized skeletal structures are unremarkable. IMPRESSION: No active disease. Electronically Signed   By: Rosalene Colon M.D.   On: 02/08/2024 15:09    ROS: As per HPI otherwise negative.  Physical Exam: Vitals:   02/09/24 0725 02/09/24 0813 02/09/24 0823 02/09/24 0831  BP:  (!) 163/128 (!) 160/123 (!) 184/150  Pulse:  98 88 89  Resp:  12 (!) 25 (!) 25  Temp: 98 F (36.7 C) 99.1 F (37.3 C)    TempSrc: Oral     SpO2:      Weight:  75.8 kg       General: Chronically ill appearing female in no acute distress. Head: Normocephalic, atraumatic, sclera non-icteric, mucus membranes are moist Neck: Supple. JVD not elevated. Lungs: Clear bilaterally to auscultation without wheezes, rales, or rhonchi. Breathing is unlabored. Heart: RRR with S1 S2. No murmurs, rubs, or gallops appreciated. Abdomen: Tenderness upper quads-no rebound tenderness. NABS.  Lower  extremities:without edema or ischemic changes, no open wounds  Neuro: Alert and oriented X 3. Moves all extremities spontaneously. Speech is slurred Psych:  Tearful, crying Dialysis Access: L AVG cannulated  Dialysis Orders: [facility] MWF 3.5 hr 180NRe 400/500 69 kg 2.0 K/ 2.0 Ca UFP 2 AVG - Heparin : 3000 units IV three times per week - Cinacalcet  180 mg PO three times per week - Venofer  100 mg IV X 10 doses (6/10 doses have been given) - Mircera 225 mcg IV Q 2 weeks (last dose 01/31/2024 next dose due 02/14/2024)   Assessment/Plan:  Abdominal pain/N & V-doubt this is uremia. W/U per primary  Possible Calciphylaxis-Refused Sodium Thiosulfate previously until order was finally dc'd at OP clinic. Will discuss with patient today. Consider asking gen surgery to biopsy Hypoglycemia-BS 140. Stop D10W.  ESRD -  HD today on schedule.   Hypertension/volume  - Very hypertensive at present however she is in pain. Continue home meds, UF as tolerated. Does not appear volume overloaded by exam. CXR unremarkable,   Anemia  - Recent ESA dose at OP clinic. HGB 9.8  Metabolic bone disease -  Continue BMD meds. Add PO4 to labs  Nutrition - Low albumin . Add protein supplements Lupus -per primary. Follows with Dr. Ziokowska not on meds at present. Has upcoming appt with rheum.   Kemisha Bonnette H. Bevin Bucks, NP-C 02/09/2024, 8:59 AM  [employer] [phone]

## 2024-02-09 NOTE — Assessment & Plan Note (Signed)
Continue blood pressure control with amlodipine and irbesartan.

## 2024-02-09 NOTE — ED Notes (Signed)
 Patient transported to dialysis

## 2024-02-09 NOTE — ED Notes (Signed)
 POC CBG 35. Patient given PRN D50, will recheck CBG soon.

## 2024-02-09 NOTE — ED Notes (Signed)
 Glucose in the 30s. Pt awake, GCS 15. Given something to drink and eat along with dextrose .

## 2024-02-09 NOTE — ED Notes (Signed)
 Awaiting renvela  verification for 0800

## 2024-02-09 NOTE — Addendum Note (Signed)
 Addended by: Teodoro Feller B on: 02/09/2024 01:49 PM   Modules accepted: Orders

## 2024-02-09 NOTE — ED Notes (Signed)
 Pt's glucose down again. Amp of d50 given. Gcs 15.

## 2024-02-09 NOTE — Assessment & Plan Note (Signed)
 No signs of active disease. Patient not on immunosuppressive therapy.

## 2024-02-09 NOTE — Assessment & Plan Note (Signed)
 Change diet to regular, implemented hypoglycemic protocol.

## 2024-02-09 NOTE — Significant Event (Signed)
 Patient remained persistently hypoglycemic so started on D10.  Linda [name]

## 2024-02-09 NOTE — ED Notes (Signed)
 Awaiting renvela  from main pharmacy

## 2024-02-09 NOTE — Telephone Encounter (Signed)
 Orderer has been placed and sent to [place] location.

## 2024-02-10 ENCOUNTER — Ambulatory Visit: Admitting: Obstetrics

## 2024-02-10 DIAGNOSIS — Z992 Dependence on renal dialysis: Secondary | ICD-10-CM | POA: Diagnosis not present

## 2024-02-10 DIAGNOSIS — N186 End stage renal disease: Secondary | ICD-10-CM | POA: Diagnosis not present

## 2024-02-10 LAB — BASIC METABOLIC PANEL WITH GFR
Anion gap: 14 (ref 5–15)
BUN: 28 mg/dL — ABNORMAL HIGH (ref 6–20)
CO2: 26 mmol/L (ref 22–32)
Calcium: 9.3 mg/dL (ref 8.9–10.3)
Chloride: 95 mmol/L — ABNORMAL LOW (ref 98–111)
Creatinine, Ser: 8.36 mg/dL — ABNORMAL HIGH (ref 0.44–1.00)
GFR, Estimated: 6 mL/min — ABNORMAL LOW (ref >60)
Glucose, Bld: 84 mg/dL (ref 70–99)
Potassium: 3.3 mmol/L — ABNORMAL LOW (ref 3.5–5.1)
Sodium: 135 mmol/L (ref 135–145)

## 2024-02-10 LAB — CBC
HCT: 33 % — ABNORMAL LOW (ref 36.0–46.0)
Hemoglobin: 10.3 g/dL — ABNORMAL LOW (ref 12.0–15.0)
MCH: 29.3 pg (ref 26.0–34.0)
MCHC: 31.2 g/dL (ref 30.0–36.0)
MCV: 94 fL (ref 80.0–100.0)
Platelets: 212 10*3/uL (ref 150–400)
RBC: 3.51 MIL/uL — ABNORMAL LOW (ref 3.87–5.11)
RDW: 19.5 % — ABNORMAL HIGH (ref 11.5–15.5)
WBC: 6.6 10*3/uL (ref 4.0–10.5)
nRBC: 0 % (ref 0.0–0.2)

## 2024-02-10 LAB — GLUCOSE, CAPILLARY
Glucose-Capillary: 114 mg/dL — ABNORMAL HIGH (ref 70–99)
Glucose-Capillary: 69 mg/dL — ABNORMAL LOW (ref 70–99)
Glucose-Capillary: 79 mg/dL (ref 70–99)
Glucose-Capillary: 80 mg/dL (ref 70–99)
Glucose-Capillary: 81 mg/dL (ref 70–99)
Glucose-Capillary: 82 mg/dL (ref 70–99)
Glucose-Capillary: 83 mg/dL (ref 70–99)
Glucose-Capillary: 93 mg/dL (ref 70–99)
Glucose-Capillary: 96 mg/dL (ref 70–99)

## 2024-02-10 LAB — URIC ACID: Uric Acid, Serum: 5.5 mg/dL (ref 2.5–7.1)

## 2024-02-10 LAB — HEPATITIS B SURFACE ANTIBODY, QUANTITATIVE: Hep B S AB Quant (Post): 3.6 m[IU]/mL — ABNORMAL LOW

## 2024-02-10 MED ORDER — COLCHICINE 0.3 MG HALF TABLET
0.3000 mg | ORAL_TABLET | ORAL | Status: DC
  Administered 2024-02-10: 0.3 mg via ORAL
  Filled 2024-02-10: qty 1

## 2024-02-10 MED ORDER — HYDROMORPHONE HCL 1 MG/ML IJ SOLN
INTRAMUSCULAR | Status: AC
  Administered 2024-02-10: 0.5 mg via INTRAVENOUS
  Filled 2024-02-10: qty 0.5

## 2024-02-10 MED ORDER — ALLOPURINOL 100 MG PO TABS
100.0000 mg | ORAL_TABLET | Freq: Every day | ORAL | Status: DC
  Administered 2024-02-10 – 2024-02-12 (×3): 100 mg via ORAL
  Filled 2024-02-10 (×3): qty 1

## 2024-02-10 MED ORDER — HYDROMORPHONE HCL 1 MG/ML IJ SOLN
0.5000 mg | Freq: Once | INTRAMUSCULAR | Status: DC
  Filled 2024-02-10: qty 0.5

## 2024-02-10 MED ORDER — COLCHICINE 0.3 MG HALF TABLET: 0.3000 mg | ORAL_TABLET | ORAL | Status: DC

## 2024-02-10 NOTE — Progress Notes (Signed)
 Pt receives out-pt HD at [facility] [place] on MWF 7:10 am chair time. Will assist as needed.   Lauraine Polite Renal Navigator [phone]

## 2024-02-10 NOTE — Progress Notes (Signed)
 Refuse Tele

## 2024-02-10 NOTE — Progress Notes (Signed)
 PROGRESS NOTE    Linda [name]  ZOX:[id number] DOB: 1996/03/02 DOA: 02/08/2024 PCP: Francenia Ingle, NP    No chief complaint on file.   Brief Narrative:   Linda [name] was admitted to the hospital with the working diagnosis of volume overload in the setting of ESRD.    28 y.o. female with medical history significant for ESRD on MWF HD, heart failure, discoid lupus, HTN, AoCD, calciphylaxis with chronic pain and chronic lesions to buttocks who presented with dyspnea.  Patient missed last 2 HD sessions prior to admission, last session was 02/04/24. She developed progressive dyspnea, with cough, nausea and vomiting. Worsening painful calciphylaxis lesions in her buttocks.  On her initial physical examination her blood pressure was 163/122, HR 97, RR 20 and 02 saturation 98% on room air.  Lungs with rales bilaterally with no wheezing, heart with S1 and S2 presnet and rhythmic with no gallops or rubs, abdomen with no distention, no lower extremity edema.  Round hard nodules, subcutaneous, bilateral buttocks.    Na 134, K 4,6 Cl 98 bicarbonate 20 glucose 113 bun 53 cr 11,84  AST 23 ALT 14  BNP > 4,500  High sensitive troponin 42  Wbc 7,4 hgb 9.6 plt 219    Chest radiograph with cardiomegaly and bilateral hilar vascular congestion, with no infiltrates or effusions.    CT abdomen and pelvis with cardiomegaly and pulmonary edema, small pericardial effusion, and small volume ascites.  Pericholecystic fluid is favored due to third spacing of fluid,    EKG 96 bpm, normal axis, normal intervals, qtc 471, sinus rhythm with bilateral atrial enlargement, with no significant ST segment or T wave changes, positive LVH.    06/11 renal replacement therapy with good toleration.        Assessment & Plan:   Principal Problem:   ESRD (end stage renal disease) on dialysis [facility]) Active Problems:   Essential hypertension   Hypoglycemia   Systemic lupus erythematosus (HCC)   GERD  without esophagitis  ESRD (end stage renal disease) on dialysis [facility]) -She missed HD on Monday -Renal consulted, HD per renal - Per renal concern for calciphylaxis, on sodium thiosulfate.  Skin lesions - Patient did have have been over a year, sometimes they express white chalky material, mildly tender, does not resemble discoid lupus. - Concern for calciphylaxis per renal, on sodium thiosulfate, again I discussed with patient about possible biopsy. - Concern for gout tophi, will start on allopurinol and colchicine   Essential hypertension Continue blood pressure control with amlodipine  and irbesartan .     Hypoglycemia Change diet to regular, implemented hypoglycemic protocol.      Systemic lupus erythematosus (HCC) No signs of active disease. Patient not on immunosuppressive therapy.    GERD without esophagitis Continue pantoprazole .    DVT prophylaxis: Subcu heparin  Code Status: Full code Family Communication: None at bedside Disposition:   Status is: Inpatient    Consultants:  reanl  Subjective:  Complaining of some pain at skin nodules.  Objective: Vitals:   02/10/24 0336 02/10/24 0500 02/10/24 0740 02/10/24 1105  BP: (!) 157/116  (!) 141/105 (!) 144/102  Pulse: 73  81 83  Resp:   18   Temp: 98 F (36.7 C)  98.5 F (36.9 C) 98.4 F (36.9 C)  TempSrc: Oral  Oral Oral  SpO2:   94%   Weight:  73.3 kg    Height:        Intake/Output Summary (Last 24 hours) at 02/10/2024 1250 Last data  filed at 02/09/2024 2245 Gross per 24 hour  Intake 3 ml  Output --  Net 3 ml   Filed Weights   02/09/24 0813 02/09/24 1302 02/10/24 0500  Weight: 75.8 kg 75.8 kg 73.3 kg    Examination:   Patient was seen and examined with female chaperone her RN Linda [name] at bedside  Awake Alert, Oriented X 3, frail. Symmetrical Chest wall movement, Good air movement bilaterally, CTAB RRR,No Gallops,Rubs or new Murmurs, No Parasternal Heave +ve B.Sounds, Abd Soft, No tenderness,  No rebound - guarding or rigidity. No Cyanosis, Clubbing or edema,   Multiple skin lesions, please see picture below  Right Elbow           Data Reviewed: I have personally reviewed following labs and imaging studies  CBC: Recent Labs  Lab 02/08/24 1451 02/09/24 0439 02/10/24 0753  WBC 7.4 7.4 6.6  NEUTROABS 3.9  --   --   HGB 9.6* 9.8* 10.3*  HCT 32.3* 32.9* 33.0*  MCV 97.0 99.4 94.0  PLT 219 195 212    Basic Metabolic Panel: Recent Labs  Lab 02/08/24 1451 02/09/24 0439 02/10/24 0753  NA 134* 134* 135  K 4.6 4.6 3.3*  CL 98 100 95*  CO2 20* 17* 26  GLUCOSE 113* 140* 84  BUN 53* 58* 28*  CREATININE 11.84* 11.99* 8.36*  CALCIUM  9.1 8.5* 9.3    GFR: Estimated Creatinine Clearance: 10.3 mL/min (A) (by C-G formula based on SCr of 8.36 mg/dL (H)).  Liver Function Tests: Recent Labs  Lab 02/08/24 1451  AST 23  ALT 14  ALKPHOS 95  BILITOT 1.1  PROT 7.6  ALBUMIN  3.4*    CBG: Recent Labs  Lab 02/10/24 0054 02/10/24 0508 02/10/24 0649 02/10/24 0742 02/10/24 1107  GLUCAP 82 69* 93 83 114*     Recent Results (from the past 240 hours)  Herpes simplex virus culture     Status: None   Collection Time: 02/02/24  4:40 PM   Specimen: Other   GE  Result Value Ref Range Status   HSV Culture/Type Comment  Final    Comment: Negative No Herpes simplex virus isolated.          Radiology Studies: US  Abdomen Limited RUQ (LIVER/GB) Addendum Date: 02/08/2024 ADDENDUM REPORT: 02/08/2024 22:01 ADDENDUM: Echogenic right kidney suggesting medical renal disease. No hydronephrosis Electronically Signed   By: Esmeralda Hedge M.D.   On: 02/08/2024 22:01   Result Date: 02/08/2024 CLINICAL DATA:  Right upper quadrant pain EXAM: ULTRASOUND ABDOMEN LIMITED RIGHT UPPER QUADRANT COMPARISON:  CT 02/08/2024, ultrasound 11/14/2023 FINDINGS: Gallbladder: No shadowing stones. Trace fluid adjacent to the gallbladder. Negative sonographic Murphy. Wall thickness within  normal limits Common bile duct: Diameter: 2.2 mm Liver: No focal lesion identified. Within normal limits in parenchymal echogenicity. Portal vein is patent on color Doppler imaging with normal direction of blood flow towards the liver. Other: None. IMPRESSION: Negative for gallstones or biliary dilatation. Trace fluid adjacent to the gallbladder, nonspecific. No definite sonographic evidence for acute cholecystitis Electronically Signed: By: Esmeralda Hedge M.D. On: 02/08/2024 20:48   CT ABDOMEN PELVIS WO CONTRAST Result Date: 02/08/2024 CLINICAL DATA:  Acute nonlocalized abdominal pain EXAM: CT ABDOMEN AND PELVIS WITHOUT CONTRAST TECHNIQUE: Multidetector CT imaging of the abdomen and pelvis was performed following the standard protocol without IV contrast. RADIATION DOSE REDUCTION: This exam was performed according to the departmental dose-optimization program which includes automated exposure control, adjustment of the mA and/or kV according to patient size and/or use of  iterative reconstruction technique. COMPARISON:  CT pelvis 01/11/2024 and CT abdomen pelvis 11/14/2023 FINDINGS: Lower chest: Ground-glass opacities and interlobular septal thickening in the lower lungs compatible with edema. Marked cardiomegaly. Small pericardial effusion. Hepatobiliary: Unremarkable noncontrast appearance of the liver. Pericholecystic fluid is present. No biliary dilation radiopaque stone Pancreas: Unremarkable. Spleen: Unremarkable. Adrenals/Urinary Tract: Normal adrenal glands. Scarring the left kidney. No urinary calculi or hydronephrosis. Unremarkable bladder. Stomach/Bowel: Normal caliber large and small bowel. No bowel wall thickening. Stomach and appendix are within normal limits. Vascular/Lymphatic: Aortic atherosclerosis. No enlarged abdominal or pelvic lymph nodes. Reproductive: Uterus and bilateral adnexa are unremarkable. Other: Small volume low-density free fluid in the pelvis. Mesenteric edema. No free  intraperitoneal air. Musculoskeletal: Dystrophic calcifications in gluteal soft tissue. No acute fracture. Chronic L5 spondylolysis without spondylolisthesis. IMPRESSION: 1. Marked cardiomegaly with pulmonary edema, small pericardial effusion, and small volume ascites. 2. Pericholecystic fluid is favored due to third spacing of fluid. If there is concern for acute cholecystitis, consider further evaluation with right upper quadrant ultrasound. 3. Aortic Atherosclerosis (ICD10-I70.0). Electronically Signed   By: Rozell Cornet M.D.   On: 02/08/2024 19:14   DG Chest Port 1 View Result Date: 02/08/2024 CLINICAL DATA:  Fatigue, dizziness. EXAM: PORTABLE CHEST 1 VIEW COMPARISON:  Jan 28, 2024. FINDINGS: Stable cardiomegaly. Both lungs are clear. The visualized skeletal structures are unremarkable. IMPRESSION: No active disease. Electronically Signed   By: Rosalene Colon M.D.   On: 02/08/2024 15:09        Scheduled Meds:  allopurinol  100 mg Oral Daily   amLODipine   10 mg Oral QHS   Chlorhexidine  Gluconate Cloth  6 each Topical Q0600   [START ON 02/14/2024] colchicine  0.3 mg Oral Q Mon   colchicine  0.3 mg Oral Q Thu   heparin   5,000 Units Subcutaneous Q8H    HYDROmorphone  (DILAUDID ) injection  0.5 mg Intravenous Once   irbesartan   300 mg Oral Daily   sevelamer  carbonate  2.4 g Oral BID WC   sodium chloride  flush  3 mL Intravenous Q12H   Continuous Infusions:   LOS: 1 day    Seena Dadds, MD [facility]   To contact the attending provider between 7A-7P or the covering provider during after hours 7P-7A, please log into the web site [email or web address] and access using universal [place] password for that web site. If you do not have the password, please call the hospital operator.  02/10/2024, 12:50 PM

## 2024-02-10 NOTE — Progress Notes (Signed)
 [facility] Progress Note   Subjective: Seen in room. She is still tearful, says she just wants something done about the pain. Discussed doing biopsy to rule out calciphylaxis. She agrees to proceed. Denies SOB.  Net UF 3.8 liters in HD 02/09/2024. Feels better today. BS  80-114 this AM   Objective Vitals:   02/10/24 0336 02/10/24 0500 02/10/24 0740 02/10/24 1105  BP: (!) 157/116  (!) 141/105 (!) 144/102  Pulse: 73  81 83  Resp:   18   Temp: 98 F (36.7 C)  98.5 F (36.9 C) 98.4 F (36.9 C)  TempSrc: Oral  Oral Oral  SpO2:   94%   Weight:  73.3 kg    Height:       Additional Objective Labs: Basic Metabolic Panel: Recent Labs  Lab 02/08/24 1451 02/09/24 0439 02/10/24 0753  NA 134* 134* 135  K 4.6 4.6 3.3*  CL 98 100 95*  CO2 20* 17* 26  GLUCOSE 113* 140* 84  BUN 53* 58* 28*  CREATININE 11.84* 11.99* 8.36*  CALCIUM  9.1 8.5* 9.3   Liver Function Tests: Recent Labs  Lab 02/08/24 1451  AST 23  ALT 14  ALKPHOS 95  BILITOT 1.1  PROT 7.6  ALBUMIN  3.4*   Recent Labs  Lab 02/08/24 1451  LIPASE 28   CBC: Recent Labs  Lab 02/08/24 1451 02/09/24 0439 02/10/24 0753  WBC 7.4 7.4 6.6  NEUTROABS 3.9  --   --   HGB 9.6* 9.8* 10.3*  HCT 32.3* 32.9* 33.0*  MCV 97.0 99.4 94.0  PLT 219 195 212   Blood Culture    Component Value Date/Time   SDES BLOOD BLOOD RIGHT ARM 10/10/2023 0206   SPECREQUEST  10/10/2023 0206    BOTTLES DRAWN AEROBIC AND ANAEROBIC Blood Culture results may not be optimal due to an inadequate volume of blood received in culture bottles   CULT  10/10/2023 0206    NO GROWTH 5 DAYS Performed at [facility], [address], [place], [place] [address]    REPTSTATUS 10/15/2023 FINAL 10/10/2023 0206    Cardiac Enzymes: No results for input(s): CKTOTAL, CKMB, CKMBINDEX, TROPONINI in the last 168 hours. CBG: Recent Labs  Lab 02/10/24 0054 02/10/24 0508 02/10/24 0649 02/10/24 0742 02/10/24 1107  GLUCAP 82  69* 93 83 114*   Iron  Studies: No results for input(s): IRON , TIBC, TRANSFERRIN, FERRITIN in the last 72 hours. @lablastinr3 @ Studies/Results: US  Abdomen Limited RUQ (LIVER/GB) Addendum Date: 02/08/2024 ADDENDUM REPORT: 02/08/2024 22:01 ADDENDUM: Echogenic right kidney suggesting medical renal disease. No hydronephrosis Electronically Signed   By: Esmeralda Hedge M.D.   On: 02/08/2024 22:01   Result Date: 02/08/2024 CLINICAL DATA:  Right upper quadrant pain EXAM: ULTRASOUND ABDOMEN LIMITED RIGHT UPPER QUADRANT COMPARISON:  CT 02/08/2024, ultrasound 11/14/2023 FINDINGS: Gallbladder: No shadowing stones. Trace fluid adjacent to the gallbladder. Negative sonographic Murphy. Wall thickness within normal limits Common bile duct: Diameter: 2.2 mm Liver: No focal lesion identified. Within normal limits in parenchymal echogenicity. Portal vein is patent on color Doppler imaging with normal direction of blood flow towards the liver. Other: None. IMPRESSION: Negative for gallstones or biliary dilatation. Trace fluid adjacent to the gallbladder, nonspecific. No definite sonographic evidence for acute cholecystitis Electronically Signed: By: Esmeralda Hedge M.D. On: 02/08/2024 20:48   CT ABDOMEN PELVIS WO CONTRAST Result Date: 02/08/2024 CLINICAL DATA:  Acute nonlocalized abdominal pain EXAM: CT ABDOMEN AND PELVIS WITHOUT CONTRAST TECHNIQUE: Multidetector CT imaging of the abdomen and pelvis was performed  following the standard protocol without IV contrast. RADIATION DOSE REDUCTION: This exam was performed according to the departmental dose-optimization program which includes automated exposure control, adjustment of the mA and/or kV according to patient size and/or use of iterative reconstruction technique. COMPARISON:  CT pelvis 01/11/2024 and CT abdomen pelvis 11/14/2023 FINDINGS: Lower chest: Ground-glass opacities and interlobular septal thickening in the lower lungs compatible with edema. Marked  cardiomegaly. Small pericardial effusion. Hepatobiliary: Unremarkable noncontrast appearance of the liver. Pericholecystic fluid is present. No biliary dilation radiopaque stone Pancreas: Unremarkable. Spleen: Unremarkable. Adrenals/Urinary Tract: Normal adrenal glands. Scarring the left kidney. No urinary calculi or hydronephrosis. Unremarkable bladder. Stomach/Bowel: Normal caliber large and small bowel. No bowel wall thickening. Stomach and appendix are within normal limits. Vascular/Lymphatic: Aortic atherosclerosis. No enlarged abdominal or pelvic lymph nodes. Reproductive: Uterus and bilateral adnexa are unremarkable. Other: Small volume low-density free fluid in the pelvis. Mesenteric edema. No free intraperitoneal air. Musculoskeletal: Dystrophic calcifications in gluteal soft tissue. No acute fracture. Chronic L5 spondylolysis without spondylolisthesis. IMPRESSION: 1. Marked cardiomegaly with pulmonary edema, small pericardial effusion, and small volume ascites. 2. Pericholecystic fluid is favored due to third spacing of fluid. If there is concern for acute cholecystitis, consider further evaluation with right upper quadrant ultrasound. 3. Aortic Atherosclerosis (ICD10-I70.0). Electronically Signed   By: Rozell Cornet M.D.   On: 02/08/2024 19:14   DG Chest Port 1 View Result Date: 02/08/2024 CLINICAL DATA:  Fatigue, dizziness. EXAM: PORTABLE CHEST 1 VIEW COMPARISON:  Jan 28, 2024. FINDINGS: Stable cardiomegaly. Both lungs are clear. The visualized skeletal structures are unremarkable. IMPRESSION: No active disease. Electronically Signed   By: Rosalene Colon M.D.   On: 02/08/2024 15:09   Medications:   allopurinol  100 mg Oral Daily   amLODipine   10 mg Oral QHS   Chlorhexidine  Gluconate Cloth  6 each Topical Q0600   [START ON 02/14/2024] colchicine  0.3 mg Oral Q Mon   colchicine  0.3 mg Oral Q Thu   heparin   5,000 Units Subcutaneous Q8H    HYDROmorphone  (DILAUDID ) injection  0.5 mg  Intravenous Once   irbesartan   300 mg Oral Daily   sevelamer  carbonate  2.4 g Oral BID WC   sodium chloride  flush  3 mL Intravenous Q12H     General: Chronically ill appearing female in no acute distress. Head: Normocephalic, atraumatic, sclera non-icteric, mucus membranes are moist Neck: Supple. JVD not elevated. Lungs: Clear bilaterally to auscultation without wheezes, rales, or rhonchi. Breathing is unlabored. Heart: RRR with S1 S2. No murmurs, rubs, or gallops appreciated. Abdomen: Tenderness upper quads-no rebound tenderness. NABS.  Lower extremities:without edema or ischemic changes, no open wounds  Skin: has multiple hardened dark lesion R buttock which are very painful.  Neuro: Alert and oriented X 3. Moves all extremities spontaneously. Speech is slurred Psych:  Tearful, crying Dialysis Access: L AVG cannulated   Dialysis Orders: [facility] MWF 3.5 hr 180NRe 400/500 69 kg 2.0 K/ 2.0 Ca UFP 2 AVG - Heparin : 3000 units IV three times per week - Cinacalcet  180 mg PO three times per week - Venofer  100 mg IV X 10 doses (6/10 doses have been given) - Mircera 225 mcg IV Q 2 weeks (last dose 01/31/2024 next dose due 02/14/2024)     Assessment/Plan:  Abdominal pain/N & V-doubt this is uremia. W/U per primary  Possible Calciphylaxis-Refused Sodium Thiosulfate previously until order was finally dc'd at OP clinic. Will discuss with patient today. Consider asking gen surgery to biopsy. Discussed with primary  Hypoglycemia-resolved.   ESRD -  HD today on schedule.   Hypertension/volume  - Net UF 3.8. BP better controlled today. Still above OP EDW. Continue to lower volume, optimize with IHD. UF as tolerated.   Anemia  - Recent ESA dose at OP clinic. HGB 9.8  Metabolic bone disease -  Continue BMD meds. Add PO4 to labs  Nutrition - Low albumin . Add protein supplements Lupus -per primary. Follows with Dr. Ziokowska not on meds at present. Has upcoming appt with rheum.   Linda Grandinetti H. Florie Carico  NP-C 02/10/2024, 1:26 PM  [employer] [phone]

## 2024-02-10 NOTE — Consult Note (Incomplete)
 Linda [name] [birth year]  [id number].    Requesting MD: Seena Dadds  Chief Complaint/Reason for Consult: Worsening calciphylaxis pain  HPI: Linda [name] is a 28 y.o. female with a history of end-stage renal disease, hypertension, lupus, calciphylaxis and heart failure who presents with worsening pain associated with calciphylaxis-like lesions on her buttocks.  Past Medical History: See below Prior Abdominal Surgeries: *** Blood Thinners: *** Last PO intake: *** Last Colonoscopy: *** Allergies: *** Tobacco Use: *** Alcohol Use: *** Substance use: ***  Employment: ***   ROS: ROS  Family History  Problem Relation Age of Onset   Lupus Mother    Hypertension Mother    Kidney disease Mother    Heart Problems Mother    Coronary artery disease Mother 21       stent   Diabetes Maternal Grandmother     Past Medical History:  Diagnosis Date   Abnormal ECG    a. In setting of LVH w/ repolarization abnormalities; b. 03/2019 MV: No ischemia/infarct. EF 55-65%.   Angioedema    ESRD on hemodialysis (HCC)    GERD (gastroesophageal reflux disease)    Hypertension    Lupus    LVH (left ventricular hypertrophy)    a. 11/2017 Echo: EF 50-55%. triv AI. Mild MR. Mod L and mild R atrial enlargement. Nl RV size/fxn. Small pericardial effusion w/o tampondae; b. 03/2018 Echo: EF 55-60%, restrictive filling pattern. Nl RV size/fxn. Sev LAE. Mild MR, mild to mod TR/PR. Mod PAH. Triv effusion.   Migraines    Mixed connective tissue disease (HCC)    Previous Dialysis patient [facility])    a. Off HD since late 2020.   Septic prepatellar bursitis of right knee 08/19/2017    Past Surgical History:  Procedure Laterality Date   GRAFT APPLICATION Left 03/30/2019   Procedure: BRACHIAL CEPHALIC GRAFT CREATION;  Surgeon: Celso College, MD;  Location: [facility] ORS;  Service: Vascular;  Laterality: Left;   I & D EXTREMITY Right 08/06/2017   Procedure: IRRIGATION AND DEBRIDEMENT KNEE;   Surgeon: Laneta Pintos, MD;  Location: [facility] OR;  Service: Orthopedics;  Laterality: Right;   IR THROMBECTOMY AV FISTULA W/THROMBOLYSIS/PTA/STENT INC/SHUNT/IMG LT Left 05/18/2022   IR US  GUIDE VASC ACCESS LEFT  05/18/2022   MULTIPLE TOOTH EXTRACTIONS      Social History:  reports that she has been smoking e-cigarettes and cigarettes. She started smoking about 9 years ago. She has a 2.2 pack-year smoking history. She has never used smokeless tobacco. She reports that she does not drink alcohol and does not use drugs.  Allergies:  Allergies  Allergen Reactions   Iodine  Hives and Shortness Of Breath   Lisinopril  Anaphylaxis    Angioedema   Gabapentin Other (See Comments)    Reaction: Tremor (intolerance); tremor   Ativan  [Lorazepam ] Other (See Comments)    Somnolent with 1mg  ativan  at [facility] Nov 2019. Patient stated it made her feel like she shut down and was mute and non-responding.   Hydroxychloroquine  Other (See Comments)    Pt reports making her skin peel really bad   Vicodin [Hydrocodone -Acetaminophen ] Itching and Rash    Medications Prior to Admission  Medication Sig Dispense Refill   amLODipine  (NORVASC ) 10 MG tablet Take 10 mg by mouth at bedtime.     furosemide  (LASIX ) 80 MG tablet Take 1 tablet (80 mg total) by mouth daily. (Patient taking differently: Take 80 mg by mouth 3 (three) times daily.)     irbesartan  (AVAPRO )  300 MG tablet Take 1 tablet (300 mg total) by mouth daily. 90 tablet 3   lidocaine -prilocaine  (EMLA ) cream Apply 1 Application topically once.     medroxyPROGESTERone  (DEPO-PROVERA ) 150 MG/ML injection Inject 1 mL (150 mg total) into the muscle every 3 (three) months. 1 mL 0   nitroGLYCERIN  (NITROSTAT ) 0.4 MG SL tablet Place 1 tablet (0.4 mg total) under the tongue every 5 (five) minutes as needed for chest pain. 30 tablet 12   ondansetron  (ZOFRAN -ODT) 4 MG disintegrating tablet Take 1 tablet (4 mg total) by mouth every 8 (eight) hours as needed for nausea or  vomiting. 20 tablet 0   sevelamer  carbonate (RENVELA ) 2.4 g PACK Take 2.4 g by mouth 2 (two) times daily with a meal.     tiZANidine  (ZANAFLEX ) 4 MG tablet Take 4 mg by mouth every 8 (eight) hours as needed for muscle spasms.       Physical Exam: Blood pressure (!) 145/113, pulse 83, temperature 97.6 F (36.4 C), temperature source Oral, resp. rate 18, height 5' 6 (1.676 m), weight 73.3 kg, SpO2 97%. Physical Exam   Results for orders placed or performed during the hospital encounter of 02/08/24 (from the past 48 hours)  CBG monitoring, ED     Status: Abnormal   Collection Time: 02/08/24 11:58 PM  Result Value Ref Range   Glucose-Capillary 34 (LL) 70 - 99 mg/dL    Comment: Glucose reference range applies only to samples taken after fasting for at least 8 hours.   Comment 1 Notify RN   Hepatitis B surface antigen     Status: None   Collection Time: 02/09/24 12:00 AM  Result Value Ref Range   Hepatitis B Surface Ag NON REACTIVE NON REACTIVE    Comment: Performed at [facility], [address] [place], [place] [address]  Hepatitis B surface antibody,quantitative     Status: Abnormal   Collection Time: 02/09/24 12:00 AM  Result Value Ref Range   Hep B S AB Quant (Post) 3.6 (L) Immunity>10 mIU/mL    Comment: (NOTE)  Status of Immunity                     Anti-HBs Level  ------------------                     -------------- Inconsistent with Immunity                  0.0 - 10.0 Consistent with Immunity                         >10.0 Performed At: [facility] [address] [place], [place] [id number] Pearlean Botts MD AO:[id number]   CBG monitoring, ED     Status: None   Collection Time: 02/09/24 12:21 AM  Result Value Ref Range   Glucose-Capillary 70 70 - 99 mg/dL    Comment: Glucose reference range applies only to samples taken after fasting for at least 8 hours.  CBG monitoring, ED     Status: None   Collection Time: 02/09/24 12:50 AM  Result Value Ref Range    Glucose-Capillary 73 70 - 99 mg/dL    Comment: Glucose reference range applies only to samples taken after fasting for at least 8 hours.  CBG monitoring, ED     Status: Abnormal   Collection Time: 02/09/24  1:58 AM  Result Value Ref Range   Glucose-Capillary 47 (L) 70 -  99 mg/dL    Comment: Glucose reference range applies only to samples taken after fasting for at least 8 hours.   Comment 1 Notify RN   CBG monitoring, ED     Status: None   Collection Time: 02/09/24  3:04 AM  Result Value Ref Range   Glucose-Capillary 90 70 - 99 mg/dL    Comment: Glucose reference range applies only to samples taken after fasting for at least 8 hours.  CBG monitoring, ED     Status: None   Collection Time: 02/09/24  3:49 AM  Result Value Ref Range   Glucose-Capillary 76 70 - 99 mg/dL    Comment: Glucose reference range applies only to samples taken after fasting for at least 8 hours.  CBG monitoring, ED     Status: Abnormal   Collection Time: 02/09/24  4:24 AM  Result Value Ref Range   Glucose-Capillary 35 (LL) 70 - 99 mg/dL    Comment: Glucose reference range applies only to samples taken after fasting for at least 8 hours.  CBC     Status: Abnormal   Collection Time: 02/09/24  4:39 AM  Result Value Ref Range   WBC 7.4 4.0 - 10.5 K/uL   RBC 3.31 (L) 3.87 - 5.11 MIL/uL   Hemoglobin 9.8 (L) 12.0 - 15.0 g/dL   HCT 62.1 (L) 30.8 - 65.7 %   MCV 99.4 80.0 - 100.0 fL   MCH 29.6 26.0 - 34.0 pg   MCHC 29.8 (L) 30.0 - 36.0 g/dL   RDW 84.6 (H) 96.2 - 95.2 %   Platelets 195 150 - 400 K/uL   nRBC 0.0 0.0 - 0.2 %    Comment: Performed at [facility], [address] [place], [place] [address]  Basic metabolic panel     Status: Abnormal   Collection Time: 02/09/24  4:39 AM  Result Value Ref Range   Sodium 134 (L) 135 - 145 mmol/L   Potassium 4.6 3.5 - 5.1 mmol/L   Chloride 100 98 - 111 mmol/L   CO2 17 (L) 22 - 32 mmol/L   Glucose, Bld 140 (H) 70 - 99 mg/dL    Comment: Glucose reference range  applies only to samples taken after fasting for at least 8 hours.   BUN 58 (H) 6 - 20 mg/dL   Creatinine, Ser 44.01 (H) 0.44 - 1.00 mg/dL   Calcium  8.5 (L) 8.9 - 10.3 mg/dL   GFR, Estimated 4 (L) >60 mL/min    Comment: (NOTE) Calculated using the CKD-EPI Creatinine Equation (2021)    Anion gap 17 (H) 5 - 15    Comment: Performed at [facility], [address], [place], [place] [address]  CBG monitoring, ED     Status: Abnormal   Collection Time: 02/09/24  5:04 AM  Result Value Ref Range   Glucose-Capillary 116 (H) 70 - 99 mg/dL    Comment: Glucose reference range applies only to samples taken after fasting for at least 8 hours.  CBG monitoring, ED     Status: None   Collection Time: 02/09/24  5:33 AM  Result Value Ref Range   Glucose-Capillary 75 70 - 99 mg/dL    Comment: Glucose reference range applies only to samples taken after fasting for at least 8 hours.  CBG monitoring, ED     Status: Abnormal   Collection Time: 02/09/24  6:00 AM  Result Value Ref Range   Glucose-Capillary 41 (LL) 70 - 99 mg/dL  Comment: Glucose reference range applies only to samples taken after fasting for at least 8 hours.  CBG monitoring, ED     Status: Abnormal   Collection Time: 02/09/24  6:35 AM  Result Value Ref Range   Glucose-Capillary 133 (H) 70 - 99 mg/dL    Comment: Glucose reference range applies only to samples taken after fasting for at least 8 hours.  CBG monitoring, ED     Status: None   Collection Time: 02/09/24  7:22 AM  Result Value Ref Range   Glucose-Capillary 77 70 - 99 mg/dL    Comment: Glucose reference range applies only to samples taken after fasting for at least 8 hours.  Glucose, capillary     Status: Abnormal   Collection Time: 02/09/24 10:34 AM  Result Value Ref Range   Glucose-Capillary 68 (L) 70 - 99 mg/dL    Comment: Glucose reference range applies only to samples taken after fasting for at least 8 hours.  Glucose, capillary     Status: None   Collection  Time: 02/09/24 11:58 AM  Result Value Ref Range   Glucose-Capillary 70 70 - 99 mg/dL    Comment: Glucose reference range applies only to samples taken after fasting for at least 8 hours.  Glucose, capillary     Status: Abnormal   Collection Time: 02/09/24  1:08 PM  Result Value Ref Range   Glucose-Capillary 61 (L) 70 - 99 mg/dL    Comment: Glucose reference range applies only to samples taken after fasting for at least 8 hours.  Glucose, capillary     Status: None   Collection Time: 02/09/24  5:08 PM  Result Value Ref Range   Glucose-Capillary 91 70 - 99 mg/dL    Comment: Glucose reference range applies only to samples taken after fasting for at least 8 hours.  Glucose, capillary     Status: Abnormal   Collection Time: 02/09/24  8:11 PM  Result Value Ref Range   Glucose-Capillary 100 (H) 70 - 99 mg/dL    Comment: Glucose reference range applies only to samples taken after fasting for at least 8 hours.  Glucose, capillary     Status: None   Collection Time: 02/09/24 10:19 PM  Result Value Ref Range   Glucose-Capillary 78 70 - 99 mg/dL    Comment: Glucose reference range applies only to samples taken after fasting for at least 8 hours.  Glucose, capillary     Status: None   Collection Time: 02/10/24 12:54 AM  Result Value Ref Range   Glucose-Capillary 82 70 - 99 mg/dL    Comment: Glucose reference range applies only to samples taken after fasting for at least 8 hours.  Glucose, capillary     Status: Abnormal   Collection Time: 02/10/24  5:08 AM  Result Value Ref Range   Glucose-Capillary 69 (L) 70 - 99 mg/dL    Comment: Glucose reference range applies only to samples taken after fasting for at least 8 hours.  Glucose, capillary     Status: None   Collection Time: 02/10/24  6:49 AM  Result Value Ref Range   Glucose-Capillary 93 70 - 99 mg/dL    Comment: Glucose reference range applies only to samples taken after fasting for at least 8 hours.  Glucose, capillary     Status: None    Collection Time: 02/10/24  7:42 AM  Result Value Ref Range   Glucose-Capillary 83 70 - 99 mg/dL    Comment: Glucose reference range applies only to  samples taken after fasting for at least 8 hours.  Basic metabolic panel with GFR     Status: Abnormal   Collection Time: 02/10/24  7:53 AM  Result Value Ref Range   Sodium 135 135 - 145 mmol/L   Potassium 3.3 (L) 3.5 - 5.1 mmol/L   Chloride 95 (L) 98 - 111 mmol/L   CO2 26 22 - 32 mmol/L   Glucose, Bld 84 70 - 99 mg/dL    Comment: Glucose reference range applies only to samples taken after fasting for at least 8 hours.   BUN 28 (H) 6 - 20 mg/dL   Creatinine, Ser 1.61 (H) 0.44 - 1.00 mg/dL   Calcium  9.3 8.9 - 10.3 mg/dL   GFR, Estimated 6 (L) >60 mL/min    Comment: (NOTE) Calculated using the CKD-EPI Creatinine Equation (2021)    Anion gap 14 5 - 15    Comment: Performed at [facility], [address] [place], [place] [address]  CBC     Status: Abnormal   Collection Time: 02/10/24  7:53 AM  Result Value Ref Range   WBC 6.6 4.0 - 10.5 K/uL   RBC 3.51 (L) 3.87 - 5.11 MIL/uL   Hemoglobin 10.3 (L) 12.0 - 15.0 g/dL   HCT 54.0 (L) 98.1 - 19.1 %   MCV 94.0 80.0 - 100.0 fL   MCH 29.3 26.0 - 34.0 pg   MCHC 31.2 30.0 - 36.0 g/dL   RDW 47.8 (H) 29.5 - 62.1 %   Platelets 212 150 - 400 K/uL   nRBC 0.0 0.0 - 0.2 %    Comment: Performed at [facility], [address], [place], [place] [address]  Uric acid     Status: None   Collection Time: 02/10/24  7:53 AM  Result Value Ref Range   Uric Acid, Serum 5.5 2.5 - 7.1 mg/dL    Comment: Performed at [facility], [address] [place], [place] [address]  Glucose, capillary     Status: Abnormal   Collection Time: 02/10/24 11:07 AM  Result Value Ref Range   Glucose-Capillary 114 (H) 70 - 99 mg/dL    Comment: Glucose reference range applies only to samples taken after fasting for at least 8 hours.  Glucose, capillary     Status: None   Collection Time: 02/10/24  1:25 PM   Result Value Ref Range   Glucose-Capillary 80 70 - 99 mg/dL    Comment: Glucose reference range applies only to samples taken after fasting for at least 8 hours.  Glucose, capillary     Status: None   Collection Time: 02/10/24  3:35 PM  Result Value Ref Range   Glucose-Capillary 79 70 - 99 mg/dL    Comment: Glucose reference range applies only to samples taken after fasting for at least 8 hours.   US  Abdomen Limited RUQ (LIVER/GB) Addendum Date: 02/08/2024 ADDENDUM REPORT: 02/08/2024 22:01 ADDENDUM: Echogenic right kidney suggesting medical renal disease. No hydronephrosis Electronically Signed   By: Esmeralda Hedge M.D.   On: 02/08/2024 22:01   Result Date: 02/08/2024 CLINICAL DATA:  Right upper quadrant pain EXAM: ULTRASOUND ABDOMEN LIMITED RIGHT UPPER QUADRANT COMPARISON:  CT 02/08/2024, ultrasound 11/14/2023 FINDINGS: Gallbladder: No shadowing stones. Trace fluid adjacent to the gallbladder. Negative sonographic Murphy. Wall thickness within normal limits Common bile duct: Diameter: 2.2 mm Liver: No focal lesion identified. Within normal limits in parenchymal echogenicity. Portal vein is patent on color Doppler imaging with normal direction of blood  flow towards the liver. Other: None. IMPRESSION: Negative for gallstones or biliary dilatation. Trace fluid adjacent to the gallbladder, nonspecific. No definite sonographic evidence for acute cholecystitis Electronically Signed: By: Esmeralda Hedge M.D. On: 02/08/2024 20:48   CT ABDOMEN PELVIS WO CONTRAST Result Date: 02/08/2024 CLINICAL DATA:  Acute nonlocalized abdominal pain EXAM: CT ABDOMEN AND PELVIS WITHOUT CONTRAST TECHNIQUE: Multidetector CT imaging of the abdomen and pelvis was performed following the standard protocol without IV contrast. RADIATION DOSE REDUCTION: This exam was performed according to the departmental dose-optimization program which includes automated exposure control, adjustment of the mA and/or kV according to patient size  and/or use of iterative reconstruction technique. COMPARISON:  CT pelvis 01/11/2024 and CT abdomen pelvis 11/14/2023 FINDINGS: Lower chest: Ground-glass opacities and interlobular septal thickening in the lower lungs compatible with edema. Marked cardiomegaly. Small pericardial effusion. Hepatobiliary: Unremarkable noncontrast appearance of the liver. Pericholecystic fluid is present. No biliary dilation radiopaque stone Pancreas: Unremarkable. Spleen: Unremarkable. Adrenals/Urinary Tract: Normal adrenal glands. Scarring the left kidney. No urinary calculi or hydronephrosis. Unremarkable bladder. Stomach/Bowel: Normal caliber large and small bowel. No bowel wall thickening. Stomach and appendix are within normal limits. Vascular/Lymphatic: Aortic atherosclerosis. No enlarged abdominal or pelvic lymph nodes. Reproductive: Uterus and bilateral adnexa are unremarkable. Other: Small volume low-density free fluid in the pelvis. Mesenteric edema. No free intraperitoneal air. Musculoskeletal: Dystrophic calcifications in gluteal soft tissue. No acute fracture. Chronic L5 spondylolysis without spondylolisthesis. IMPRESSION: 1. Marked cardiomegaly with pulmonary edema, small pericardial effusion, and small volume ascites. 2. Pericholecystic fluid is favored due to third spacing of fluid. If there is concern for acute cholecystitis, consider further evaluation with right upper quadrant ultrasound. 3. Aortic Atherosclerosis (ICD10-I70.0). Electronically Signed   By: Rozell Cornet M.D.   On: 02/08/2024 19:14    Anti-infectives (From admission, onward)    None       Assessment/Plan  Worsening calciphylaxis pain in buttocks region.   Consulted for skin biopsy  FEN -regular VTE -SCDs ID -none Foley -none Dispo -admitted to [facility].   I reviewed {Reviewed data:26882::last 24 h vitals and pain scores,last 48 h intake and output,last 24 h labs and trends,last 24 h imaging results}.  This care required  {MDM levels:26883} level of medical decision making.   Mirta Ammon, PA-C [facility] Surgery 02/10/2024, 4:21 PM Please see Amion for pager number during day hours 7:00am-4:30pm

## 2024-02-11 DIAGNOSIS — N186 End stage renal disease: Secondary | ICD-10-CM | POA: Diagnosis not present

## 2024-02-11 DIAGNOSIS — Z992 Dependence on renal dialysis: Secondary | ICD-10-CM | POA: Diagnosis not present

## 2024-02-11 LAB — RENAL FUNCTION PANEL
Albumin: 3 g/dL — ABNORMAL LOW (ref 3.5–5.0)
Anion gap: 17 — ABNORMAL HIGH (ref 5–15)
BUN: 38 mg/dL — ABNORMAL HIGH (ref 6–20)
CO2: 22 mmol/L (ref 22–32)
Calcium: 9.6 mg/dL (ref 8.9–10.3)
Chloride: 96 mmol/L — ABNORMAL LOW (ref 98–111)
Creatinine, Ser: 10.99 mg/dL — ABNORMAL HIGH (ref 0.44–1.00)
GFR, Estimated: 4 mL/min — ABNORMAL LOW (ref >60)
Glucose, Bld: 77 mg/dL (ref 70–99)
Phosphorus: 7.1 mg/dL — ABNORMAL HIGH (ref 2.5–4.6)
Potassium: 3.9 mmol/L (ref 3.5–5.1)
Sodium: 135 mmol/L (ref 135–145)

## 2024-02-11 LAB — CBC
HCT: 34.2 % — ABNORMAL LOW (ref 36.0–46.0)
Hemoglobin: 10.5 g/dL — ABNORMAL LOW (ref 12.0–15.0)
MCH: 28.8 pg (ref 26.0–34.0)
MCHC: 30.7 g/dL (ref 30.0–36.0)
MCV: 93.7 fL (ref 80.0–100.0)
Platelets: 227 10*3/uL (ref 150–400)
RBC: 3.65 MIL/uL — ABNORMAL LOW (ref 3.87–5.11)
RDW: 19 % — ABNORMAL HIGH (ref 11.5–15.5)
WBC: 6.5 10*3/uL (ref 4.0–10.5)
nRBC: 0 % (ref 0.0–0.2)

## 2024-02-11 LAB — GLUCOSE, CAPILLARY
Glucose-Capillary: 72 mg/dL (ref 70–99)
Glucose-Capillary: 97 mg/dL (ref 70–99)
Glucose-Capillary: 116 mg/dL — ABNORMAL HIGH (ref 70–99)
Glucose-Capillary: 91 mg/dL (ref 70–99)

## 2024-02-11 MED ORDER — TIZANIDINE HCL 4 MG PO TABS
4.0000 mg | ORAL_TABLET | Freq: Three times a day (TID) | ORAL | Status: DC | PRN
  Administered 2024-02-11 – 2024-02-12 (×2): 4 mg via ORAL
  Filled 2024-02-11 (×3): qty 1

## 2024-02-11 MED ORDER — FENTANYL CITRATE PF 50 MCG/ML IJ SOSY
50.0000 ug | PREFILLED_SYRINGE | Freq: Once | INTRAMUSCULAR | Status: AC
  Administered 2024-02-11: 50 ug via INTRAVENOUS
  Filled 2024-02-11: qty 1

## 2024-02-11 MED ORDER — HYDROMORPHONE HCL 1 MG/ML IJ SOLN: 0.5000 mg | Freq: Once | INTRAMUSCULAR | Status: DC

## 2024-02-11 MED ORDER — HEPARIN SODIUM (PORCINE) 1000 UNIT/ML DIALYSIS: 1000.0000 [IU] | INTRAMUSCULAR | Status: DC | PRN

## 2024-02-11 MED ORDER — LIDOCAINE HCL (PF) 1 % IJ SOLN: 5.0000 mL | INTRAMUSCULAR | Status: DC | PRN

## 2024-02-11 MED ORDER — HEPARIN SODIUM (PORCINE) 1000 UNIT/ML DIALYSIS
3000.0000 [IU] | Freq: Once | INTRAMUSCULAR | Status: AC
  Administered 2024-02-11: 3000 [IU] via INTRAVENOUS_CENTRAL

## 2024-02-11 MED ORDER — HYDROMORPHONE HCL 1 MG/ML IJ SOLN
0.5000 mg | INTRAMUSCULAR | Status: DC | PRN
  Administered 2024-02-11 – 2024-02-12 (×3): 0.5 mg via INTRAVENOUS
  Filled 2024-02-11 (×3): qty 0.5

## 2024-02-11 MED ORDER — OXYCODONE HCL 5 MG PO TABS
ORAL_TABLET | ORAL | Status: AC
  Filled 2024-02-11: qty 2

## 2024-02-11 MED ORDER — LIDOCAINE-PRILOCAINE 2.5-2.5 % EX CREA
1.0000 | TOPICAL_CREAM | CUTANEOUS | Status: DC | PRN
Start: 2024-02-11 — End: 2024-02-11

## 2024-02-11 MED ORDER — PENTAFLUOROPROP-TETRAFLUOROETH EX AERO: 1.0000 | INHALATION_SPRAY | CUTANEOUS | Status: DC | PRN

## 2024-02-11 MED ORDER — HEPARIN SODIUM (PORCINE) 1000 UNIT/ML IJ SOLN
INTRAMUSCULAR | Status: AC
  Filled 2024-02-11: qty 3

## 2024-02-11 NOTE — Procedures (Signed)
 Procedure Note  Patient: Linda [name] MRN: [id number]  Date: 02/11/24  Procedure: Punch biopsy of skin of right gluteus  Details: The patient was placed in the position and the skin was prepped with chlorhexidine .  The skin adjacent to the large right gluteal nodule was infiltrated with 1 mL 1% lidocaine .  A 6 mm punch was used to biopsy the skin and subcutaneous tissue.  The specimen was placed in formalin and sent for routine pathology.  The biopsy site was closed with 3-0 Vicryl suture.  A clean gauze dressing was applied.  The patient tolerated the procedure well with no apparent complications.  Karleen Overall, MD [facility] General, Hepatobiliary and Pancreatic Surgery 02/11/24 3:51 PM

## 2024-02-11 NOTE — Progress Notes (Addendum)
 PROGRESS NOTE    Linda [name]  NFA:[id number] DOB: 06-16-1996 DOA: 02/08/2024 PCP: Francenia Ingle, NP    No chief complaint on file.   Brief Narrative:   Linda [name] was admitted to the hospital with the working diagnosis of volume overload in the setting of ESRD.    28 y.o. female with medical history significant for ESRD on MWF HD, heart failure, discoid lupus, HTN, AoCD, calciphylaxis with chronic pain and chronic lesions to buttocks who presented with dyspnea.  Patient missed last 2 HD sessions prior to admission, last session was 02/04/24. She developed progressive dyspnea, with cough, nausea and vomiting. Worsening painful calciphylaxis lesions in her buttocks.  On her initial physical examination her blood pressure was 163/122, HR 97, RR 20 and 02 saturation 98% on room air.  Lungs with rales bilaterally with no wheezing, heart with S1 and S2 presnet and rhythmic with no gallops or rubs, abdomen with no distention, no lower extremity edema.  Round hard nodules, subcutaneous, bilateral buttocks.    Na 134, K 4,6 Cl 98 bicarbonate 20 glucose 113 bun 53 cr 11,84  AST 23 ALT 14  BNP > 4,500  High sensitive troponin 42  Wbc 7,4 hgb 9.6 plt 219    Chest radiograph with cardiomegaly and bilateral hilar vascular congestion, with no infiltrates or effusions.    CT abdomen and pelvis with cardiomegaly and pulmonary edema, small pericardial effusion, and small volume ascites.  Pericholecystic fluid is favored due to third spacing of fluid,    EKG 96 bpm, normal axis, normal intervals, qtc 471, sinus rhythm with bilateral atrial enlargement, with no significant ST segment or T wave changes, positive LVH.    06/11 renal replacement therapy with good toleration.        Assessment & Plan:   Principal Problem:   ESRD (end stage renal disease) on dialysis [facility]) Active Problems:   Essential hypertension   Hypoglycemia   Systemic lupus erythematosus (HCC)   GERD  without esophagitis  Shortness of breath Acute on chronic HFmrEF with pulmonary edema and small pericardial effusion  ESRD (end stage renal disease) on dialysis (HCC) Volume overload due to acute on chronic CHF and missing hemodialysis on ESRD -Patient presents with dyspnea, missed hemodialysis on Monday, evidence of volume overload on imaging including chest x-ray showing pulmonary edema, and she is symptomatic with dyspnea -She missed HD on Monday -Renal consulted, HD per renal - Per renal concern for calciphylaxis, on sodium thiosulfate. - Tolerated her HD earlier this morning with no issues  Skin lesions -Patient did have this lesion over a year, he has been seen by plastics, and dermatology in the past. - Has been seen by plastic in the past for discoid lupus in her face.  He has been on Thalomide infusions in the past for that - Dermatology has seen her for these lesions, no clear diagnosis. - Concern for calciphylaxis per renal, but she has been refusing/volume subsulfate. - Unclear if this is related to gout, but she did report intermittently chalky discharge from her arm lesions, so I have started on colchicine  and allopurinol . - General Surgery consulted to evaluate if biopsy is appropriate here   Essential hypertension Continue blood pressure control with amlodipine  and irbesartan .     Hypoglycemia Change diet to regular, implemented hypoglycemic protocol.      Systemic lupus erythematosus (HCC) No signs of active disease. Patient not on immunosuppressive therapy.    GERD without esophagitis Continue pantoprazole .    DVT  prophylaxis: Subcu heparin  Code Status: Full code Family Communication: None at bedside Disposition:   Status is: Inpatient    Consultants:  Renal General surgery  Subjective:  She is frustrated, argumentative, reports pain at her nodules  Objective: Vitals:   02/11/24 1130 02/11/24 1200 02/11/24 1213 02/11/24 1214  BP: (!) 149/116 (!)  161/122 (!) 150/122 (!) 151/120  Pulse: 81 88  77  Resp: 12 20 15 15   Temp:    97.7 F (36.5 C)  TempSrc:      SpO2:    98%  Weight:      Height:        Intake/Output Summary (Last 24 hours) at 02/11/2024 1429 Last data filed at 02/11/2024 1214 Gross per 24 hour  Intake 123 ml  Output 3500 ml  Net -3377 ml   Filed Weights   02/09/24 1302 02/10/24 0500 02/11/24 0818  Weight: 75.8 kg 73.3 kg 75.8 kg    Examination:  Awake, alert, she is frustrated   Picture on 6/12           Data Reviewed: I have personally reviewed following labs and imaging studies  CBC: Recent Labs  Lab 02/08/24 1451 02/09/24 0439 02/10/24 0753 02/11/24 0843  WBC 7.4 7.4 6.6 6.5  NEUTROABS 3.9  --   --   --   HGB 9.6* 9.8* 10.3* 10.5*  HCT 32.3* 32.9* 33.0* 34.2*  MCV 97.0 99.4 94.0 93.7  PLT 219 195 212 227    Basic Metabolic Panel: Recent Labs  Lab 02/08/24 1451 02/09/24 0439 02/10/24 0753 02/11/24 0843  NA 134* 134* 135 135  K 4.6 4.6 3.3* 3.9  CL 98 100 95* 96*  CO2 20* 17* 26 22  GLUCOSE 113* 140* 84 77  BUN 53* 58* 28* 38*  CREATININE 11.84* 11.99* 8.36* 10.99*  CALCIUM  9.1 8.5* 9.3 9.6  PHOS  --   --   --  7.1*    GFR: Estimated Creatinine Clearance: 7.9 mL/min (A) (by C-G formula based on SCr of 10.99 mg/dL (H)).  Liver Function Tests: Recent Labs  Lab 02/08/24 1451 02/11/24 0843  AST 23  --   ALT 14  --   ALKPHOS 95  --   BILITOT 1.1  --   PROT 7.6  --   ALBUMIN  3.4* 3.0*    CBG: Recent Labs  Lab 02/10/24 1325 02/10/24 1535 02/10/24 1731 02/10/24 2137 02/11/24 0408  GLUCAP 80 79 81 96 72     Recent Results (from the past 240 hours)  Herpes simplex virus culture     Status: None   Collection Time: 02/02/24  4:40 PM   Specimen: Other   GE  Result Value Ref Range Status   HSV Culture/Type Comment  Final    Comment: Negative No Herpes simplex virus isolated.          Radiology Studies: No results found.       Scheduled  Meds:  allopurinol   100 mg Oral Daily   amLODipine   10 mg Oral QHS   Chlorhexidine  Gluconate Cloth  6 each Topical Q0600   [START ON 02/14/2024] colchicine   0.3 mg Oral Q Mon   colchicine   0.3 mg Oral Q Thu   heparin   5,000 Units Subcutaneous Q8H   irbesartan   300 mg Oral Daily   sevelamer  carbonate  2.4 g Oral BID WC   sodium chloride  flush  3 mL Intravenous Q12H   Continuous Infusions:   LOS: 2 days    [employer],  MD [facility]   To contact the attending provider between 7A-7P or the covering provider during after hours 7P-7A, please log into the web site [email or web address] and access using universal Economy password for that web site. If you do not have the password, please call the hospital operator.  02/11/2024, 2:29 PM

## 2024-02-11 NOTE — Progress Notes (Signed)
 [facility] Progress Note   Subjective: Seen in dialysis. Receiving pain medication currently.  Surgery consult pending for biopsy of gluteal lesion.   Objective Vitals:   02/10/24 1613 02/10/24 2015 02/11/24 0818 02/11/24 0833  BP: (!) 145/113 (!) 165/125 (!) 160/118 (!) 151/109  Pulse:  95 88 82  Resp:  16 12 14   Temp: 97.6 F (36.4 C) 97.8 F (36.6 C) 98.8 F (37.1 C)   TempSrc: Oral Oral    SpO2: 97% 90% 94% 91%  Weight:   75.8 kg   Height:       Additional Objective Labs: Basic Metabolic Panel: Recent Labs  Lab 02/08/24 1451 02/09/24 0439 02/10/24 0753  NA 134* 134* 135  K 4.6 4.6 3.3*  CL 98 100 95*  CO2 20* 17* 26  GLUCOSE 113* 140* 84  BUN 53* 58* 28*  CREATININE 11.84* 11.99* 8.36*  CALCIUM  9.1 8.5* 9.3   Liver Function Tests: Recent Labs  Lab 02/08/24 1451  AST 23  ALT 14  ALKPHOS 95  BILITOT 1.1  PROT 7.6  ALBUMIN  3.4*   Recent Labs  Lab 02/08/24 1451  LIPASE 28   CBC: Recent Labs  Lab 02/08/24 1451 02/09/24 0439 02/10/24 0753  WBC 7.4 7.4 6.6  NEUTROABS 3.9  --   --   HGB 9.6* 9.8* 10.3*  HCT 32.3* 32.9* 33.0*  MCV 97.0 99.4 94.0  PLT 219 195 212   Blood Culture    Component Value Date/Time   SDES BLOOD BLOOD RIGHT ARM 10/10/2023 0206   SPECREQUEST  10/10/2023 0206    BOTTLES DRAWN AEROBIC AND ANAEROBIC Blood Culture results may not be optimal due to an inadequate volume of blood received in culture bottles   CULT  10/10/2023 0206    NO GROWTH 5 DAYS Performed at [facility] Lab, [address], [place], [place] [address]    REPTSTATUS 10/15/2023 FINAL 10/10/2023 0206    Cardiac Enzymes: No results for input(s): CKTOTAL, CKMB, CKMBINDEX, TROPONINI in the last 168 hours. CBG: Recent Labs  Lab 02/10/24 1325 02/10/24 1535 02/10/24 1731 02/10/24 2137 02/11/24 0408  GLUCAP 80 79 81 96 72   Iron  Studies: No results for input(s): IRON , TIBC, TRANSFERRIN, FERRITIN in the last 72  hours. @lablastinr3 @ Studies/Results: No results found.  Medications:   allopurinol   100 mg Oral Daily   amLODipine   10 mg Oral QHS   Chlorhexidine  Gluconate Cloth  6 each Topical Q0600   [START ON 02/14/2024] colchicine   0.3 mg Oral Q Mon   colchicine   0.3 mg Oral Q Thu   heparin   5,000 Units Subcutaneous Q8H   irbesartan   300 mg Oral Daily   sevelamer  carbonate  2.4 g Oral BID WC   sodium chloride  flush  3 mL Intravenous Q12H     General: Chronically ill appearing female in no acute distress. Head: Normocephalic, atraumatic, sclera non-icteric, mucus membranes are moist Neck: Supple. JVD not elevated. Lungs: Clear bilaterally to auscultation without wheezes, rales, or rhonchi. Breathing is unlabored. Heart: RRR with S1 S2. No murmurs, rubs, or gallops appreciated. Abdomen: Tenderness upper quads-no rebound tenderness. NABS.  Lower extremities:without edema or ischemic changes, no open wounds  Skin: has multiple hardened dark lesion R buttock which are very painful.  Neuro: Alert and oriented X 3. Moves all extremities spontaneously. Speech is slurred Psych:  Tearful, crying Dialysis Access: L AVG cannulated   Dialysis Orders: [facility] MWF 3.5 hr 180NRe 400/500 69 kg 2.0 K/ 2.0 Ca UFP 2  AVG - Heparin : 3000 units IV three times per week - Cinacalcet  180 mg PO three times per week - Venofer  100 mg IV X 10 doses (6/10 doses have been given) - Mircera 225 mcg IV Q 2 weeks (last dose 01/31/2024 next dose due 02/14/2024)     Assessment/Plan:  Abdominal pain/N & V-doubt this is uremia. W/U per primary  Possible Calciphylaxis-Refused Sodium Thiosulfate previously due to reaction until order was finally dc'd at OP clinic. Has been previously seen by surgery in 12/2023 who recommended no surgical intervention due to high risk for poor healing with surgery, but we have now consulted general surgery for a biopsy to rule out other etiologies that would be treatable.  Pain control per primary.   She was previously on oxycodone  15mg  outpt.   ESRD -  HD today on schedule.   Hypertension/volume  - Continue to lower volume, optimize with IHD. UF as tolerated. Standing wt as able   Anemia  - Recent ESA dose at OP clinic. HGB 10s this AM.   Metabolic bone disease -  Continue BMD meds. Phos 6.5.   Nutrition - Low albumin . Add protein supplements Lupus -per primary. Follows with Dr. Ziokowska not on meds at present. Has upcoming appt with rheum.   Adrian Alba MD [facility] Pager [phone]

## 2024-02-11 NOTE — Procedures (Signed)
 Received patient in bed to unit.  Alert and oriented.  Informed consent signed and in chart.   TX duration: 3.5 hours  Patient tolerated well.  Transported back to the room  Alert, without acute distress.  Hand-off given to patient's nurse.   Access used: left graft Access issues: none  Total UF removed: 3.5 liters Medication(s) given: oxycodone   Clover Dao, RN [facility]

## 2024-02-11 NOTE — Consult Note (Signed)
 Linda [name] [birth year]  [id number].    Requesting MD: Dr. Seena Dadds Chief Complaint/Reason for Consult: skin biopsy  HPI:  Linda [name] is a 28 yo female with a history of ESRD on HD, heart failure, HTN and lupus who has chronic pain from calcified lesions on the buttocks. She is currently admitted with volume overload. She has had persistent pain from bilateral buttock lesions, which appear calcified on CT. She also has calcified nodules on the arms. She says that most of her pain is from the buttocks. Nephrology is suspicious for calciphylaxis but requests a biopsy to confirm the diagnosis. General surgery was consulted.  ROS: ROS  Family History  Problem Relation Age of Onset   Lupus Mother    Hypertension Mother    Kidney disease Mother    Heart Problems Mother    Coronary artery disease Mother 55       stent   Diabetes Maternal Grandmother     Past Medical History:  Diagnosis Date   Abnormal ECG    a. In setting of LVH w/ repolarization abnormalities; b. 03/2019 MV: No ischemia/infarct. EF 55-65%.   Angioedema    ESRD on hemodialysis (HCC)    GERD (gastroesophageal reflux disease)    Hypertension    Lupus    LVH (left ventricular hypertrophy)    a. 11/2017 Echo: EF 50-55%. triv AI. Mild MR. Mod L and mild R atrial enlargement. Nl RV size/fxn. Small pericardial effusion w/o tampondae; b. 03/2018 Echo: EF 55-60%, restrictive filling pattern. Nl RV size/fxn. Sev LAE. Mild MR, mild to mod TR/PR. Mod PAH. Triv effusion.   Migraines    Mixed connective tissue disease (HCC)    Previous Dialysis patient [facility])    a. Off HD since late 2020.   Septic prepatellar bursitis of right knee 08/19/2017    Past Surgical History:  Procedure Laterality Date   GRAFT APPLICATION Left 03/30/2019   Procedure: BRACHIAL CEPHALIC GRAFT CREATION;  Surgeon: Celso College, MD;  Location: [facility] ORS;  Service: Vascular;  Laterality: Left;   I & D EXTREMITY Right 08/06/2017   Procedure:  IRRIGATION AND DEBRIDEMENT KNEE;  Surgeon: Laneta Pintos, MD;  Location: [facility] OR;  Service: Orthopedics;  Laterality: Right;   IR THROMBECTOMY AV FISTULA W/THROMBOLYSIS/PTA/STENT INC/SHUNT/IMG LT Left 05/18/2022   IR US  GUIDE VASC ACCESS LEFT  05/18/2022   MULTIPLE TOOTH EXTRACTIONS      Social History:  reports that she has been smoking e-cigarettes and cigarettes. She started smoking about 9 years ago. She has a 2.3 pack-year smoking history. She has never used smokeless tobacco. She reports that she does not drink alcohol and does not use drugs.  Allergies:  Allergies  Allergen Reactions   Iodine  Hives and Shortness Of Breath   Lisinopril  Anaphylaxis    Angioedema   Gabapentin Other (See Comments)    Reaction: Tremor (intolerance); tremor   Ativan  [Lorazepam ] Other (See Comments)    Somnolent with 1mg  ativan  at [facility] Nov 2019. Patient stated it made her feel like she shut down and was mute and non-responding.   Hydroxychloroquine  Other (See Comments)    Pt reports making her skin peel really bad   Vicodin [Hydrocodone -Acetaminophen ] Itching and Rash    Medications Prior to Admission  Medication Sig Dispense Refill   amLODipine  (NORVASC ) 10 MG tablet Take 10 mg by mouth at bedtime.     furosemide  (LASIX ) 80 MG tablet Take 1 tablet (80 mg total) by mouth daily. (Patient taking  differently: Take 80 mg by mouth 3 (three) times daily.)     irbesartan  (AVAPRO ) 300 MG tablet Take 1 tablet (300 mg total) by mouth daily. 90 tablet 3   lidocaine -prilocaine  (EMLA ) cream Apply 1 Application topically once.     medroxyPROGESTERone  (DEPO-PROVERA ) 150 MG/ML injection Inject 1 mL (150 mg total) into the muscle every 3 (three) months. 1 mL 0   nitroGLYCERIN  (NITROSTAT ) 0.4 MG SL tablet Place 1 tablet (0.4 mg total) under the tongue every 5 (five) minutes as needed for chest pain. 30 tablet 12   ondansetron  (ZOFRAN -ODT) 4 MG disintegrating tablet Take 1 tablet (4 mg total) by mouth every 8  (eight) hours as needed for nausea or vomiting. 20 tablet 0   sevelamer  carbonate (RENVELA ) 2.4 g PACK Take 2.4 g by mouth 2 (two) times daily with a meal.     tiZANidine  (ZANAFLEX ) 4 MG tablet Take 4 mg by mouth every 8 (eight) hours as needed for muscle spasms.       Physical Exam: Blood pressure (!) 151/120, pulse 77, temperature 97.7 F (36.5 C), resp. rate 15, height 5' 6 (1.676 m), weight 75.8 kg, SpO2 98%. General: resting comfortably, NAD Neuro: alert and oriented, no focal deficits Resp: normal work of breathing on room air Skin: bilateral subcutaneous nodules on the buttocks, with diffuse smaller firm subcutaneous nodules. Firm subcutaneous nodules on the upper arms.   Results for orders placed or performed during the hospital encounter of 02/08/24 (from the past 48 hours)  Glucose, capillary     Status: None   Collection Time: 02/09/24  5:08 PM  Result Value Ref Range   Glucose-Capillary 91 70 - 99 mg/dL    Comment: Glucose reference range applies only to samples taken after fasting for at least 8 hours.  Glucose, capillary     Status: Abnormal   Collection Time: 02/09/24  8:11 PM  Result Value Ref Range   Glucose-Capillary 100 (H) 70 - 99 mg/dL    Comment: Glucose reference range applies only to samples taken after fasting for at least 8 hours.  Glucose, capillary     Status: None   Collection Time: 02/09/24 10:19 PM  Result Value Ref Range   Glucose-Capillary 78 70 - 99 mg/dL    Comment: Glucose reference range applies only to samples taken after fasting for at least 8 hours.  Glucose, capillary     Status: None   Collection Time: 02/10/24 12:54 AM  Result Value Ref Range   Glucose-Capillary 82 70 - 99 mg/dL    Comment: Glucose reference range applies only to samples taken after fasting for at least 8 hours.  Glucose, capillary     Status: Abnormal   Collection Time: 02/10/24  5:08 AM  Result Value Ref Range   Glucose-Capillary 69 (L) 70 - 99 mg/dL    Comment:  Glucose reference range applies only to samples taken after fasting for at least 8 hours.  Glucose, capillary     Status: None   Collection Time: 02/10/24  6:49 AM  Result Value Ref Range   Glucose-Capillary 93 70 - 99 mg/dL    Comment: Glucose reference range applies only to samples taken after fasting for at least 8 hours.  Glucose, capillary     Status: None   Collection Time: 02/10/24  7:42 AM  Result Value Ref Range   Glucose-Capillary 83 70 - 99 mg/dL    Comment: Glucose reference range applies only to samples taken after fasting for at least  8 hours.  Basic metabolic panel with GFR     Status: Abnormal   Collection Time: 02/10/24  7:53 AM  Result Value Ref Range   Sodium 135 135 - 145 mmol/L   Potassium 3.3 (L) 3.5 - 5.1 mmol/L   Chloride 95 (L) 98 - 111 mmol/L   CO2 26 22 - 32 mmol/L   Glucose, Bld 84 70 - 99 mg/dL    Comment: Glucose reference range applies only to samples taken after fasting for at least 8 hours.   BUN 28 (H) 6 - 20 mg/dL   Creatinine, Ser 2.13 (H) 0.44 - 1.00 mg/dL   Calcium  9.3 8.9 - 10.3 mg/dL   GFR, Estimated 6 (L) >60 mL/min    Comment: (NOTE) Calculated using the CKD-EPI Creatinine Equation (2021)    Anion gap 14 5 - 15    Comment: Performed at [facility], [address] [place], [place] [address]  CBC     Status: Abnormal   Collection Time: 02/10/24  7:53 AM  Result Value Ref Range   WBC 6.6 4.0 - 10.5 K/uL   RBC 3.51 (L) 3.87 - 5.11 MIL/uL   Hemoglobin 10.3 (L) 12.0 - 15.0 g/dL   HCT 84.6 (L) 96.2 - 95.2 %   MCV 94.0 80.0 - 100.0 fL   MCH 29.3 26.0 - 34.0 pg   MCHC 31.2 30.0 - 36.0 g/dL   RDW 84.1 (H) 32.4 - 40.1 %   Platelets 212 150 - 400 K/uL   nRBC 0.0 0.0 - 0.2 %    Comment: Performed at [facility], [address] [place], [place] [address]  Uric acid     Status: None   Collection Time: 02/10/24  7:53 AM  Result Value Ref Range   Uric Acid, Serum 5.5 2.5 - 7.1 mg/dL    Comment: Performed at [facility]  Lab, [address], [place], [place] [address]  Glucose, capillary     Status: Abnormal   Collection Time: 02/10/24 11:07 AM  Result Value Ref Range   Glucose-Capillary 114 (H) 70 - 99 mg/dL    Comment: Glucose reference range applies only to samples taken after fasting for at least 8 hours.  Glucose, capillary     Status: None   Collection Time: 02/10/24  1:25 PM  Result Value Ref Range   Glucose-Capillary 80 70 - 99 mg/dL    Comment: Glucose reference range applies only to samples taken after fasting for at least 8 hours.  Glucose, capillary     Status: None   Collection Time: 02/10/24  3:35 PM  Result Value Ref Range   Glucose-Capillary 79 70 - 99 mg/dL    Comment: Glucose reference range applies only to samples taken after fasting for at least 8 hours.  Glucose, capillary     Status: None   Collection Time: 02/10/24  5:31 PM  Result Value Ref Range   Glucose-Capillary 81 70 - 99 mg/dL    Comment: Glucose reference range applies only to samples taken after fasting for at least 8 hours.  Glucose, capillary     Status: None   Collection Time: 02/10/24  9:37 PM  Result Value Ref Range   Glucose-Capillary 96 70 - 99 mg/dL    Comment: Glucose reference range applies only to samples taken after fasting for at least 8 hours.  Glucose, capillary     Status: None   Collection Time: 02/11/24  4:08 AM  Result Value Ref  Range   Glucose-Capillary 72 70 - 99 mg/dL    Comment: Glucose reference range applies only to samples taken after fasting for at least 8 hours.  Renal function panel     Status: Abnormal   Collection Time: 02/11/24  8:43 AM  Result Value Ref Range   Sodium 135 135 - 145 mmol/L   Potassium 3.9 3.5 - 5.1 mmol/L   Chloride 96 (L) 98 - 111 mmol/L   CO2 22 22 - 32 mmol/L   Glucose, Bld 77 70 - 99 mg/dL    Comment: Glucose reference range applies only to samples taken after fasting for at least 8 hours.   BUN 38 (H) 6 - 20 mg/dL   Creatinine, Ser 16.10 (H) 0.44 - 1.00 mg/dL    Calcium  9.6 8.9 - 10.3 mg/dL   Phosphorus 7.1 (H) 2.5 - 4.6 mg/dL   Albumin  3.0 (L) 3.5 - 5.0 g/dL   GFR, Estimated 4 (L) >60 mL/min    Comment: (NOTE) Calculated using the CKD-EPI Creatinine Equation (2021)    Anion gap 17 (H) 5 - 15    Comment: Performed at [facility], [address], [place], [place] [address]  CBC     Status: Abnormal   Collection Time: 02/11/24  8:43 AM  Result Value Ref Range   WBC 6.5 4.0 - 10.5 K/uL   RBC 3.65 (L) 3.87 - 5.11 MIL/uL   Hemoglobin 10.5 (L) 12.0 - 15.0 g/dL   HCT 40.9 (L) 81.1 - 91.4 %   MCV 93.7 80.0 - 100.0 fL   MCH 28.8 26.0 - 34.0 pg   MCHC 30.7 30.0 - 36.0 g/dL   RDW 78.2 (H) 95.6 - 21.3 %   Platelets 227 150 - 400 K/uL   nRBC 0.0 0.0 - 0.2 %    Comment: Performed at [facility], [address], [place], [place] [address]   No results found.    Assessment/Plan 28 yo female with ESRD on HD, with chronic painful skin nodules. CT scan shows multiple large gluteal nodules, as well as diffuse smaller dense areas consistent with calcium  deposits. This is suspicious for calciphylaxis. I discussed with the patient and the primary team that I do not recommend incisional or excisional biopsy of the larger gluteal masses as I do not think this will heal. We can offer a punch biopsy of the skin. I discussed this with the patient and reviewed the procedure details. Punch biopsy of the gluteal skin was performed at the bedside. Skin was closed with an absorbable suture and covered with a gauze dressing. Patient may shower over the wound. Change dressing at least daily, may leave open to air after 72 hours. Please call with any further questions or concerns.   Karleen Overall, MD [facility] Surgery General, Hepatobiliary and Pancreatic Surgery 02/11/24 2:52 PM

## 2024-02-12 ENCOUNTER — Other Ambulatory Visit (HOSPITAL_COMMUNITY): Payer: Self-pay

## 2024-02-12 DIAGNOSIS — N186 End stage renal disease: Secondary | ICD-10-CM | POA: Diagnosis not present

## 2024-02-12 DIAGNOSIS — Z992 Dependence on renal dialysis: Secondary | ICD-10-CM | POA: Diagnosis not present

## 2024-02-12 MED ORDER — OXYCODONE HCL 5 MG PO TABS
5.0000 mg | ORAL_TABLET | ORAL | 0 refills | Status: DC | PRN
  Filled 2024-02-12: qty 20, 4d supply, fill #0

## 2024-02-12 MED ORDER — ALLOPURINOL 100 MG PO TABS
100.0000 mg | ORAL_TABLET | Freq: Every day | ORAL | 0 refills | Status: DC
  Filled 2024-02-12: qty 30, 30d supply, fill #0

## 2024-02-12 MED ORDER — ORAL CARE MOUTH RINSE: 15.0000 mL | OROMUCOSAL | Status: DC | PRN

## 2024-02-12 NOTE — Discharge Summary (Signed)
 Physician Discharge Summary  Linda [name] ZDG:[id number] DOB: 1996-05-27 DOA: 02/08/2024  PCP: Francenia Ingle, NP  Admit date: 02/08/2024 Discharge date: 02/12/2024  Admitted From: (Home) Disposition:  (Home )  Recommendations for Outpatient Follow-up:  Follow up with PCP in 1-2 weeks Please obtain BMP/CBC in one week Please follow on the final biopsy results as outpatient.   Diet recommendation: Renal diet with 1200 cc fluid restrictions   Brief/Interim Summary:  Linda [name] was admitted to the hospital with the working diagnosis of volume overload in the setting of ESRD.    28 y.o. female with medical history significant for ESRD on MWF HD, heart failure, discoid lupus, HTN, AoCD, calciphylaxis with chronic pain and chronic lesions to buttocks who presented with dyspnea.  Patient missed last 2 HD sessions prior to admission, last session was 02/04/24. She developed progressive dyspnea, with cough, nausea and vomiting. Worsening painful calciphylaxis lesions in her buttocks.  -Lungs with rales bilaterally with no wheezing, heart with S1 and S2 presnet and rhythmic with no gallops or rubs, abdomen with no distention, no lower extremity edema.  Round hard nodules, subcutaneous, bilateral buttocks.  Chest radiograph with cardiomegaly and bilateral hilar vascular congestion, with no infiltrates or effusions.  CT abdomen and pelvis with cardiomegaly and pulmonary edema, small pericardial effusion, and small volume ascites.  Pericholecystic fluid is favored due to third spacing of fluid,    06/11 renal replacement therapy with good toleration.  She was back on her regular dialysis schedule Monday Wednesday Friday, patient main issue was significant pain at her skin lesions, she had punch biopsy.     Shortness of breath Acute on chronic HFmrEF with pulmonary edema and small pericardial effusion  ESRD (end stage renal disease) on dialysis (HCC) Volume overload due to acute on  chronic CHF and missing hemodialysis on ESRD -Patient presents with dyspnea, missed hemodialysis on Monday, evidence of volume overload on imaging including chest x-ray showing pulmonary edema, and she is symptomatic with dyspnea -She missed HD on Monday -Renal consulted, HD per renal - Per renal concern for calciphylaxis, on sodium thiosulfate. - Tolerated her HD, back on her schedule Monday Wednesday Friday.   Skin lesions -Patient did have this lesion over a year, he has been seen by plastics, and dermatology in the past. - Has been seen by plastic in the past for discoid lupus in her face.  He has been on Thalomide infusions in the past for that - Dermatology has seen her for these lesions, no clear diagnosis. - Concern for calciphylaxis per renal, but she has been refusing/volume subsulfate. - Unclear if this is related to gout, but she did report intermittently chalky discharge from her arm lesions, so I have started on colchicine  and allopurinol .  Will be discharged on allopurinol  only. - General Surgery consulted, they did not recommend incisional or excisional biopsy of her larger gluteal masses as they do not think it will heal well, so she went for punch biopsy on 6/13, to follow results as an outpatient.    Essential hypertension Continue blood pressure control with amlodipine  and irbesartan .      Hypoglycemia Change diet to regular, implemented hypoglycemic protocol.      Systemic lupus erythematosus (HCC) No signs of active disease. Patient not on immunosuppressive therapy.    GERD without esophagitis Continue pantoprazole .        Discharge Diagnoses:  Principal Problem:   ESRD (end stage renal disease) on dialysis [facility]) Active Problems:   Essential hypertension  Hypoglycemia   Systemic lupus erythematosus (HCC)   GERD without esophagitis    Discharge Instructions  Discharge Instructions     Diet - low sodium heart healthy   Complete by: As directed     Discharge instructions   Complete by: As directed    Follow with Primary MD Francenia Ingle, NP in 7 days   Get CBC, CMP, checked  by Primary MD next visit.    Activity: As tolerated with Full fall precautions use walker/cane & assistance as needed   Disposition Home   Diet: Renal diet with 1.2 L fluid restrictions per day    On your next visit with your primary care physician please Get Medicines reviewed and adjusted.   Please request your Prim.MD to go over all Hospital Tests and Procedure/Radiological results at the follow up, please get all Hospital records sent to your Prim MD by signing hospital release before you go home.   If you experience worsening of your admission symptoms, develop shortness of breath, life threatening emergency, suicidal or homicidal thoughts you must seek medical attention immediately by calling 911 or calling your MD immediately  if symptoms less severe.  You Must read complete instructions/literature along with all the possible adverse reactions/side effects for all the Medicines you take and that have been prescribed to you. Take any new Medicines after you have completely understood and accpet all the possible adverse reactions/side effects.   Do not drive, operating heavy machinery, perform activities at heights, swimming or participation in water activities or provide baby sitting services if your were admitted for syncope or siezures until you have seen by Primary MD or a Neurologist and advised to do so again.  Do not drive when taking Pain medications.    Do not take more than prescribed Pain, Sleep and Anxiety Medications  Special Instructions: If you have smoked or chewed Tobacco  in the last 2 yrs please stop smoking, stop any regular Alcohol  and or any Recreational drug use.  Wear Seat belts while driving.   Please note  You were cared for by a hospitalist during your hospital stay. If you have any questions about your  discharge medications or the care you received while you were in the hospital after you are discharged, you can call the unit and asked to speak with the hospitalist on call if the hospitalist that took care of you is not available. Once you are discharged, your primary care physician will handle any further medical issues. Please note that NO REFILLS for any discharge medications will be authorized once you are discharged, as it is imperative that you return to your primary care physician (or establish a relationship with a primary care physician if you do not have one) for your aftercare needs so that they can reassess your need for medications and monitor your lab values.   Discharge wound care:   Complete by: As directed    lease cover right gluteal biopsy site with dry gauze. Change daily, and more often as needed if saturated. May leave wound open to air after 72 hours.  02/11/24 1549   Increase activity slowly   Complete by: As directed       Allergies as of 02/12/2024       Reactions   Iodine  Hives, Shortness Of Breath   Lisinopril  Anaphylaxis   Angioedema   Gabapentin Other (See Comments)   Reaction: Tremor (intolerance); tremor   Ativan  [lorazepam ] Other (See Comments)   Somnolent with  1mg  ativan  at [facility] Nov 2019. Patient stated it made her feel like she shut down and was mute and non-responding.   Hydroxychloroquine  Other (See Comments)   Pt reports making her skin peel really bad   Vicodin [hydrocodone -acetaminophen ] Itching, Rash        Medication List     TAKE these medications    allopurinol  100 MG tablet Commonly known as: ZYLOPRIM  Take 1 tablet (100 mg total) by mouth daily. Start taking on: February 13, 2024   amLODipine  10 MG tablet Commonly known as: NORVASC  Take 10 mg by mouth at bedtime.   furosemide  80 MG tablet Commonly known as: LASIX  Take 1 tablet (80 mg total) by mouth daily. What changed: when to take this   irbesartan  300 MG  tablet Commonly known as: AVAPRO  Take 1 tablet (300 mg total) by mouth daily.   lidocaine -prilocaine  cream Commonly known as: EMLA  Apply 1 Application topically once.   medroxyPROGESTERone  150 MG/ML injection Commonly known as: DEPO-PROVERA  Inject 1 mL (150 mg total) into the muscle every 3 (three) months.   nitroGLYCERIN  0.4 MG SL tablet Commonly known as: NITROSTAT  Place 1 tablet (0.4 mg total) under the tongue every 5 (five) minutes as needed for chest pain.   ondansetron  4 MG disintegrating tablet Commonly known as: ZOFRAN -ODT Take 1 tablet (4 mg total) by mouth every 8 (eight) hours as needed for nausea or vomiting.   oxyCODONE  5 MG immediate release tablet Commonly known as: Oxy IR/ROXICODONE  Take 1 tablet (5 mg total) by mouth every 4 (four) hours as needed for moderate pain (pain score 4-6) or breakthrough pain.   sevelamer  carbonate 2.4 g Pack Commonly known as: RENVELA  Take 2.4 g by mouth 2 (two) times daily with a meal.   tiZANidine  4 MG tablet Commonly known as: ZANAFLEX  Take 4 mg by mouth every 8 (eight) hours as needed for muscle spasms.               Discharge Care Instructions  (From admission, onward)           Start     Ordered   02/12/24 0000  Discharge wound care:       Comments: lease cover right gluteal biopsy site with dry gauze. Change daily, and more often as needed if saturated. May leave wound open to air after 72 hours.  02/11/24 1549   02/12/24 1100            Allergies  Allergen Reactions   Iodine  Hives and Shortness Of Breath   Lisinopril  Anaphylaxis    Angioedema   Gabapentin Other (See Comments)    Reaction: Tremor (intolerance); tremor   Ativan  [Lorazepam ] Other (See Comments)    Somnolent with 1mg  ativan  at [facility] Nov 2019. Patient stated it made her feel like she shut down and was mute and non-responding.   Hydroxychloroquine  Other (See Comments)    Pt reports making her skin peel really bad   Vicodin  [Hydrocodone -Acetaminophen ] Itching and Rash    Consultations: Renal General surgery   Procedures/Studies: US  Abdomen Limited RUQ (LIVER/GB) Addendum Date: 02/08/2024 ADDENDUM REPORT: 02/08/2024 22:01 ADDENDUM: Echogenic right kidney suggesting medical renal disease. No hydronephrosis Electronically Signed   By: Esmeralda Hedge M.D.   On: 02/08/2024 22:01   Result Date: 02/08/2024 CLINICAL DATA:  Right upper quadrant pain EXAM: ULTRASOUND ABDOMEN LIMITED RIGHT UPPER QUADRANT COMPARISON:  CT 02/08/2024, ultrasound 11/14/2023 FINDINGS: Gallbladder: No shadowing stones. Trace fluid adjacent to the gallbladder. Negative sonographic Murphy. Wall  thickness within normal limits Common bile duct: Diameter: 2.2 mm Liver: No focal lesion identified. Within normal limits in parenchymal echogenicity. Portal vein is patent on color Doppler imaging with normal direction of blood flow towards the liver. Other: None. IMPRESSION: Negative for gallstones or biliary dilatation. Trace fluid adjacent to the gallbladder, nonspecific. No definite sonographic evidence for acute cholecystitis Electronically Signed: By: Esmeralda Hedge M.D. On: 02/08/2024 20:48   CT ABDOMEN PELVIS WO CONTRAST Result Date: 02/08/2024 CLINICAL DATA:  Acute nonlocalized abdominal pain EXAM: CT ABDOMEN AND PELVIS WITHOUT CONTRAST TECHNIQUE: Multidetector CT imaging of the abdomen and pelvis was performed following the standard protocol without IV contrast. RADIATION DOSE REDUCTION: This exam was performed according to the departmental dose-optimization program which includes automated exposure control, adjustment of the mA and/or kV according to patient size and/or use of iterative reconstruction technique. COMPARISON:  CT pelvis 01/11/2024 and CT abdomen pelvis 11/14/2023 FINDINGS: Lower chest: Ground-glass opacities and interlobular septal thickening in the lower lungs compatible with edema. Marked cardiomegaly. Small pericardial effusion.  Hepatobiliary: Unremarkable noncontrast appearance of the liver. Pericholecystic fluid is present. No biliary dilation radiopaque stone Pancreas: Unremarkable. Spleen: Unremarkable. Adrenals/Urinary Tract: Normal adrenal glands. Scarring the left kidney. No urinary calculi or hydronephrosis. Unremarkable bladder. Stomach/Bowel: Normal caliber large and small bowel. No bowel wall thickening. Stomach and appendix are within normal limits. Vascular/Lymphatic: Aortic atherosclerosis. No enlarged abdominal or pelvic lymph nodes. Reproductive: Uterus and bilateral adnexa are unremarkable. Other: Small volume low-density free fluid in the pelvis. Mesenteric edema. No free intraperitoneal air. Musculoskeletal: Dystrophic calcifications in gluteal soft tissue. No acute fracture. Chronic L5 spondylolysis without spondylolisthesis. IMPRESSION: 1. Marked cardiomegaly with pulmonary edema, small pericardial effusion, and small volume ascites. 2. Pericholecystic fluid is favored due to third spacing of fluid. If there is concern for acute cholecystitis, consider further evaluation with right upper quadrant ultrasound. 3. Aortic Atherosclerosis (ICD10-I70.0). Electronically Signed   By: Rozell Cornet M.D.   On: 02/08/2024 19:14   DG Chest Port 1 View Result Date: 02/08/2024 CLINICAL DATA:  Fatigue, dizziness. EXAM: PORTABLE CHEST 1 VIEW COMPARISON:  Jan 28, 2024. FINDINGS: Stable cardiomegaly. Both lungs are clear. The visualized skeletal structures are unremarkable. IMPRESSION: No active disease. Electronically Signed   By: Rosalene Colon M.D.   On: 02/08/2024 15:09   DG CHEST PORT 1 VIEW Result Date: 01/28/2024 CLINICAL DATA:  Shortness of breath.  Pulmonary edema. EXAM: PORTABLE CHEST 1 VIEW COMPARISON:  Same day chest radiograph dated 01/01/2024 at 2:08 a.m. FINDINGS: Stable cardiomegaly. Persistent pulmonary vascular congestion with bilateral interstitial opacities, most compatible with pulmonary edema. No sizable  pleural effusion or pneumothorax. Visualized osseous structures are unchanged. IMPRESSION: Stable cardiomegaly with persistent pulmonary vascular congestion and pulmonary edema. Electronically Signed   By: Mannie Seek M.D.   On: 01/28/2024 19:04   DG Chest Portable 1 View Result Date: 01/28/2024 CLINICAL DATA:  Shortness of breath. EXAM: PORTABLE CHEST 1 VIEW COMPARISON:  Jan 11, 2024 FINDINGS: The cardiac silhouette is enlarged and unchanged in size. Prominence of the central pulmonary vasculature is seen. Mildly increased interstitial lung markings are also noted. No pleural effusion or pneumothorax is identified. The visualized skeletal structures are unremarkable. IMPRESSION: Cardiomegaly and pulmonary vascular congestion with mild pulmonary edema. Electronically Signed   By: Virgle Grime M.D.   On: 01/28/2024 02:13     Subjective: No significant events overnight, her lesions pain is controlled, discussed with her, she feels comfortable going home today.  Discharge Exam: Vitals:  02/11/24 1925 02/12/24 0820  BP: (!) 139/106 110/75  Pulse:    Resp: 16   Temp: 98.2 F (36.8 C) 97.6 F (36.4 C)  SpO2: 96%    Vitals:   02/11/24 1804 02/11/24 1925 02/12/24 0500 02/12/24 0820  BP: (!) 147/102 (!) 139/106  110/75  Pulse:      Resp:  16    Temp: 98.3 F (36.8 C) 98.2 F (36.8 C)  97.6 F (36.4 C)  TempSrc: Oral Oral  Oral  SpO2:  96%    Weight:   75 kg   Height:        General: Pt is alert, awake, not in acute distress Cardiovascular: RRR, S1/S2 +, no rubs, no gallops Respiratory: CTA bilaterally, no wheezing, no rhonchi Abdominal: Soft, NT, ND, bowel sounds + Extremities: no edema, no cyanosis    The results of significant diagnostics from this hospitalization (including imaging, microbiology, ancillary and laboratory) are listed below for reference.     Microbiology: Recent Results (from the past 240 hours)  Herpes simplex virus culture     Status: None    Collection Time: 02/02/24  4:40 PM   Specimen: Other   GE  Result Value Ref Range Status   HSV Culture/Type Comment  Final    Comment: Negative No Herpes simplex virus isolated.      Labs: BNP (last 3 results) Recent Labs    12/22/23 0012 01/11/24 0348 02/08/24 1451  BNP >4,500.0* >4,500.0* >4,500.0*   Basic Metabolic Panel: Recent Labs  Lab 02/08/24 1451 02/09/24 0439 02/10/24 0753 02/11/24 0843  NA 134* 134* 135 135  K 4.6 4.6 3.3* 3.9  CL 98 100 95* 96*  CO2 20* 17* 26 22  GLUCOSE 113* 140* 84 77  BUN 53* 58* 28* 38*  CREATININE 11.84* 11.99* 8.36* 10.99*  CALCIUM  9.1 8.5* 9.3 9.6  PHOS  --   --   --  7.1*   Liver Function Tests: Recent Labs  Lab 02/08/24 1451 02/11/24 0843  AST 23  --   ALT 14  --   ALKPHOS 95  --   BILITOT 1.1  --   PROT 7.6  --   ALBUMIN  3.4* 3.0*   Recent Labs  Lab 02/08/24 1451  LIPASE 28   No results for input(s): AMMONIA in the last 168 hours. CBC: Recent Labs  Lab 02/08/24 1451 02/09/24 0439 02/10/24 0753 02/11/24 0843  WBC 7.4 7.4 6.6 6.5  NEUTROABS 3.9  --   --   --   HGB 9.6* 9.8* 10.3* 10.5*  HCT 32.3* 32.9* 33.0* 34.2*  MCV 97.0 99.4 94.0 93.7  PLT 219 195 212 227   Cardiac Enzymes: No results for input(s): CKTOTAL, CKMB, CKMBINDEX, TROPONINI in the last 168 hours. BNP: Invalid input(s): POCBNP CBG: Recent Labs  Lab 02/10/24 2137 02/11/24 0408 02/11/24 1613 02/11/24 1856 02/11/24 1956  GLUCAP 96 72 97 116* 91   D-Dimer No results for input(s): DDIMER in the last 72 hours. Hgb A1c No results for input(s): HGBA1C in the last 72 hours. Lipid Profile No results for input(s): CHOL, HDL, LDLCALC, TRIG, CHOLHDL, LDLDIRECT in the last 72 hours. Thyroid function studies No results for input(s): TSH, T4TOTAL, T3FREE, THYROIDAB in the last 72 hours.  Invalid input(s): FREET3 Anemia work up No results for input(s): VITAMINB12, FOLATE, FERRITIN, TIBC, IRON ,  RETICCTPCT in the last 72 hours. Urinalysis    Component Value Date/Time   COLORURINE STRAW (A) 11/15/2023 1835   APPEARANCEUR CLEAR 11/15/2023 1835  LABSPEC 1.006 11/15/2023 1835   PHURINE 8.0 11/15/2023 1835   GLUCOSEU 50 (A) 11/15/2023 1835   HGBUR NEGATIVE 11/15/2023 1835   BILIRUBINUR NEGATIVE 11/15/2023 1835   BILIRUBINUR Negative 02/10/2018 1602   KETONESUR NEGATIVE 11/15/2023 1835   PROTEINUR 100 (A) 11/15/2023 1835   UROBILINOGEN 0.2 02/10/2018 1602   UROBILINOGEN 1.0 03/30/2015 2328   NITRITE NEGATIVE 11/15/2023 1835   LEUKOCYTESUR NEGATIVE 11/15/2023 1835   Sepsis Labs Recent Labs  Lab 02/08/24 1451 02/09/24 0439 02/10/24 0753 02/11/24 0843  WBC 7.4 7.4 6.6 6.5   Microbiology Recent Results (from the past 240 hours)  Herpes simplex virus culture     Status: None   Collection Time: 02/02/24  4:40 PM   Specimen: Other   GE  Result Value Ref Range Status   HSV Culture/Type Comment  Final    Comment: Negative No Herpes simplex virus isolated.      Time coordinating discharge: Over 30 minutes  SIGNED:   Seena Dadds, MD  [facility] 02/12/2024, 11:04 AM Pager   If 7PM-7AM, please contact night-coverage [email or web address] Password [email or web address]

## 2024-02-12 NOTE — Progress Notes (Signed)
 Patient states all belongings brought to the hospital at time of admission are accounted for and packed to take home.  Patient waiting on TOC medications; Patients transportation is waiting at entrance A.

## 2024-02-12 NOTE — Progress Notes (Signed)
 [facility] Progress Note   Subjective:   Had punch biopsy of gluteus wound yesterday. Feels well today, denies SOB, CP, dizziness. Reports she is going home today.  Objective Vitals:   02/11/24 1804 02/11/24 1925 02/12/24 0500 02/12/24 0820  BP: (!) 147/102 (!) 139/106  110/75  Pulse:      Resp:  16    Temp: 98.3 F (36.8 C) 98.2 F (36.8 C)  97.6 F (36.4 C)  TempSrc: Oral Oral  Oral  SpO2:  96%    Weight:   75 kg   Height:       Physical Exam General: alert female in NAD Heart: RRR, no murmurs, rubs or gallops Lungs: CTA bilaterally, respirations unlabored on RA Abdomen: Soft, non-distended Extremities: No edema b/l lower extremities Dialysis Access: AVG + T/b  Additional Objective Labs: Basic Metabolic Panel: Recent Labs  Lab 02/09/24 0439 02/10/24 0753 02/11/24 0843  NA 134* 135 135  K 4.6 3.3* 3.9  CL 100 95* 96*  CO2 17* 26 22  GLUCOSE 140* 84 77  BUN 58* 28* 38*  CREATININE 11.99* 8.36* 10.99*  CALCIUM  8.5* 9.3 9.6  PHOS  --   --  7.1*   Liver Function Tests: Recent Labs  Lab 02/08/24 1451 02/11/24 0843  AST 23  --   ALT 14  --   ALKPHOS 95  --   BILITOT 1.1  --   PROT 7.6  --   ALBUMIN  3.4* 3.0*   Recent Labs  Lab 02/08/24 1451  LIPASE 28   CBC: Recent Labs  Lab 02/08/24 1451 02/09/24 0439 02/10/24 0753 02/11/24 0843  WBC 7.4 7.4 6.6 6.5  NEUTROABS 3.9  --   --   --   HGB 9.6* 9.8* 10.3* 10.5*  HCT 32.3* 32.9* 33.0* 34.2*  MCV 97.0 99.4 94.0 93.7  PLT 219 195 212 227   Blood Culture    Component Value Date/Time   SDES BLOOD BLOOD RIGHT ARM 10/10/2023 0206   SPECREQUEST  10/10/2023 0206    BOTTLES DRAWN AEROBIC AND ANAEROBIC Blood Culture results may not be optimal due to an inadequate volume of blood received in culture bottles   CULT  10/10/2023 0206    NO GROWTH 5 DAYS Performed at [facility], [address], [place], [place] [address]    REPTSTATUS 10/15/2023 FINAL 10/10/2023 0206    Cardiac  Enzymes: No results for input(s): CKTOTAL, CKMB, CKMBINDEX, TROPONINI in the last 168 hours. CBG: Recent Labs  Lab 02/10/24 2137 02/11/24 0408 02/11/24 1613 02/11/24 1856 02/11/24 1956  GLUCAP 96 72 97 116* 91   Iron  Studies: No results for input(s): IRON , TIBC, TRANSFERRIN, FERRITIN in the last 72 hours. @lablastinr3 @ Studies/Results: No results found. Medications:   allopurinol   100 mg Oral Daily   amLODipine   10 mg Oral QHS   Chlorhexidine  Gluconate Cloth  6 each Topical Q0600   [START ON 02/14/2024] colchicine   0.3 mg Oral Q Mon   colchicine   0.3 mg Oral Q Thu   heparin   5,000 Units Subcutaneous Q8H   irbesartan   300 mg Oral Daily   sevelamer  carbonate  2.4 g Oral BID WC   sodium chloride  flush  3 mL Intravenous Q12H    Dialysis Orders: [facility] MWF 3.5 hr 180NRe 400/500 69 kg 2.0 K/ 2.0 Ca UFP 2 AVG - Heparin : 3000 units IV three times per week - Cinacalcet  180 mg PO three times per week - Venofer  100 mg IV X 10 doses (6/10  doses have been given) - Mircera 225 mcg IV Q 2 weeks (last dose 01/31/2024 next dose due 02/14/2024)  Assessment/Plan:  Abdominal pain/N & V-doubt this is uremia. W/U per primary. Improved  Possible Calciphylaxis-Refused Sodium Thiosulfate previously due to reaction until order was finally dc'd at OP clinic. Has been previously seen by surgery in 12/2023 who recommended no surgical intervention due to high risk for poor healing with surgery, but we have now consulted general surgery for a biopsy to rule out other etiologies that would be treatable.  Pain control per primary.  She was previously on oxycodone  15mg  outpt.   ESRD -  Continue MWF schedule, no HD issues reported  Hypertension/volume  - Continue to lower volume, optimize with IHD. UF as tolerated. Standing wt as able- still 6kg over EDW   Anemia  - Recent ESA dose at OP clinic. HGB 10s   Metabolic bone disease -  Continue BMD meds. Phos 7.1, reinforced low phos diet    Nutrition - Low albumin . Add protein supplements Lupus -per primary. Follows with Dr. Ziokowska not on meds at present. Has upcoming appt with rheum.   Ramona Burner, PA-C 02/12/2024, 9:41 AM  [facility] Pager: [phone]

## 2024-02-12 NOTE — Discharge Instructions (Signed)
 Follow with Primary MD Francenia Ingle, NP in 7 days   Get CBC, CMP, checked  by Primary MD next visit.    Activity: As tolerated with Full fall precautions use walker/cane & assistance as needed   Disposition Home   Diet: Renal diet with 1.2 L fluid restrictions per day    On your next visit with your primary care physician please Get Medicines reviewed and adjusted.   Please request your Prim.MD to go over all Hospital Tests and Procedure/Radiological results at the follow up, please get all Hospital records sent to your Prim MD by signing hospital release before you go home.   If you experience worsening of your admission symptoms, develop shortness of breath, life threatening emergency, suicidal or homicidal thoughts you must seek medical attention immediately by calling 911 or calling your MD immediately  if symptoms less severe.  You Must read complete instructions/literature along with all the possible adverse reactions/side effects for all the Medicines you take and that have been prescribed to you. Take any new Medicines after you have completely understood and accpet all the possible adverse reactions/side effects.   Do not drive, operating heavy machinery, perform activities at heights, swimming or participation in water activities or provide baby sitting services if your were admitted for syncope or siezures until you have seen by Primary MD or a Neurologist and advised to do so again.  Do not drive when taking Pain medications.    Do not take more than prescribed Pain, Sleep and Anxiety Medications  Special Instructions: If you have smoked or chewed Tobacco  in the last 2 yrs please stop smoking, stop any regular Alcohol  and or any Recreational drug use.  Wear Seat belts while driving.   Please note  You were cared for by a hospitalist during your hospital stay. If you have any questions about your discharge medications or the care you received while you were  in the hospital after you are discharged, you can call the unit and asked to speak with the hospitalist on call if the hospitalist that took care of you is not available. Once you are discharged, your primary care physician will handle any further medical issues. Please note that NO REFILLS for any discharge medications will be authorized once you are discharged, as it is imperative that you return to your primary care physician (or establish a relationship with a primary care physician if you do not have one) for your aftercare needs so that they can reassess your need for medications and monitor your lab values.

## 2024-02-13 ENCOUNTER — Telehealth: Payer: Self-pay | Admitting: Physician Assistant

## 2024-02-13 NOTE — Telephone Encounter (Signed)
 Transition of care contact from inpatient facility  Date of Discharge: 02/12/24 Date of Contact: 02/13/24 Method of contact: Phone  Attempted to contact patient to discuss transition of care from inpatient admission. Patient did not answer the phone. Voicemail box was full.  Ramona Burner, PA-C 02/13/2024, 10:01 AM  [facility] Pager: [phone]

## 2024-02-13 NOTE — Discharge Planning (Signed)
 [facility] Patient Discharge Orders- [facility] CLINIC: [place]  Patient's name: Linda [name] Admit/DC Dates: 02/08/2024 - 02/12/2024  Discharge Diagnoses: Volume overload    Skin lesions- s/p biopsy  Aranesp : Given: no   Date and amount of last dose: N/A  Last Hgb: 10.5 PRBC's Given: no Date/# of units: N/A ESA dose for discharge: same dose IV Iron  dose at discharge: venofer  100mg  IV q HD x 4 doses  Heparin  change: no  EDW Change: Yes New EDW: 71kg  Bath Change: no  Access intervention/Change: no Details:  Hectorol /Calcitriol  change: no  Discharge Labs: Calcium  9.6 Phosphorus 7.1 Albumin  3.0 K+ 3.9  IV Antibiotics: no Details:  On Coumadin?: no Last INR: Next INR: Managed By:   OTHER/APPTS/LAB ORDERS: please follow up on biopsy results!    D/C Meds to be reconciled by nurse after every discharge.  Completed By: Ramona Burner, PA-C 02/13/2024, 9:57 AM  [facility] Pager: [phone]   Reviewed by: MD:______ RN_______

## 2024-02-14 DIAGNOSIS — D631 Anemia in chronic kidney disease: Secondary | ICD-10-CM | POA: Diagnosis not present

## 2024-02-14 DIAGNOSIS — D509 Iron deficiency anemia, unspecified: Secondary | ICD-10-CM | POA: Diagnosis not present

## 2024-02-14 DIAGNOSIS — N2581 Secondary hyperparathyroidism of renal origin: Secondary | ICD-10-CM | POA: Diagnosis not present

## 2024-02-14 DIAGNOSIS — N186 End stage renal disease: Secondary | ICD-10-CM | POA: Diagnosis not present

## 2024-02-14 DIAGNOSIS — D689 Coagulation defect, unspecified: Secondary | ICD-10-CM | POA: Diagnosis not present

## 2024-02-14 DIAGNOSIS — Z992 Dependence on renal dialysis: Secondary | ICD-10-CM | POA: Diagnosis not present

## 2024-02-14 NOTE — Progress Notes (Signed)
 Late Note Entry- February 14, 2024  Pt was d/c on Saturday. Contacted [facility] this morning to be advised of pt's d/c date and that pt should have resumed care today.   Lauraine Polite Renal Navigator [phone]

## 2024-02-16 DIAGNOSIS — N2581 Secondary hyperparathyroidism of renal origin: Secondary | ICD-10-CM | POA: Diagnosis not present

## 2024-02-16 DIAGNOSIS — D509 Iron deficiency anemia, unspecified: Secondary | ICD-10-CM | POA: Diagnosis not present

## 2024-02-16 DIAGNOSIS — D631 Anemia in chronic kidney disease: Secondary | ICD-10-CM | POA: Diagnosis not present

## 2024-02-16 DIAGNOSIS — Z992 Dependence on renal dialysis: Secondary | ICD-10-CM | POA: Diagnosis not present

## 2024-02-16 DIAGNOSIS — N186 End stage renal disease: Secondary | ICD-10-CM | POA: Diagnosis not present

## 2024-02-16 DIAGNOSIS — D689 Coagulation defect, unspecified: Secondary | ICD-10-CM | POA: Diagnosis not present

## 2024-02-16 LAB — SURGICAL PATHOLOGY

## 2024-02-18 ENCOUNTER — Ambulatory Visit (HOSPITAL_COMMUNITY): Admission: RE | Admit: 2024-02-18 | Source: Home / Self Care | Admitting: Vascular Surgery

## 2024-02-18 ENCOUNTER — Other Ambulatory Visit (HOSPITAL_COMMUNITY): Payer: Self-pay

## 2024-02-22 ENCOUNTER — Ambulatory Visit: Admitting: Obstetrics

## 2024-02-23 ENCOUNTER — Ambulatory Visit (HOSPITAL_COMMUNITY): Admission: RE | Admit: 2024-02-23 | Source: Home / Self Care | Admitting: Vascular Surgery

## 2024-02-23 ENCOUNTER — Encounter (HOSPITAL_COMMUNITY): Admission: RE | Payer: Self-pay | Source: Home / Self Care

## 2024-02-23 DIAGNOSIS — D689 Coagulation defect, unspecified: Secondary | ICD-10-CM | POA: Diagnosis not present

## 2024-02-23 DIAGNOSIS — Z992 Dependence on renal dialysis: Secondary | ICD-10-CM | POA: Diagnosis not present

## 2024-02-23 DIAGNOSIS — N2581 Secondary hyperparathyroidism of renal origin: Secondary | ICD-10-CM | POA: Diagnosis not present

## 2024-02-23 DIAGNOSIS — D509 Iron deficiency anemia, unspecified: Secondary | ICD-10-CM | POA: Diagnosis not present

## 2024-02-23 DIAGNOSIS — D631 Anemia in chronic kidney disease: Secondary | ICD-10-CM | POA: Diagnosis not present

## 2024-02-23 DIAGNOSIS — N186 End stage renal disease: Secondary | ICD-10-CM | POA: Diagnosis not present

## 2024-02-23 SURGERY — A/V SHUNT INTERVENTION
Anesthesia: LOCAL | Laterality: Left

## 2024-02-23 SURGERY — A/V SHUNT INTERVENTION
Anesthesia: LOCAL

## 2024-02-25 ENCOUNTER — Ambulatory Visit: Admitting: Family Medicine

## 2024-02-25 DIAGNOSIS — D631 Anemia in chronic kidney disease: Secondary | ICD-10-CM | POA: Diagnosis not present

## 2024-02-25 DIAGNOSIS — D689 Coagulation defect, unspecified: Secondary | ICD-10-CM | POA: Diagnosis not present

## 2024-02-25 DIAGNOSIS — Z992 Dependence on renal dialysis: Secondary | ICD-10-CM | POA: Diagnosis not present

## 2024-02-25 DIAGNOSIS — N186 End stage renal disease: Secondary | ICD-10-CM | POA: Diagnosis not present

## 2024-02-25 DIAGNOSIS — N2581 Secondary hyperparathyroidism of renal origin: Secondary | ICD-10-CM | POA: Diagnosis not present

## 2024-02-25 DIAGNOSIS — D509 Iron deficiency anemia, unspecified: Secondary | ICD-10-CM | POA: Diagnosis not present

## 2024-02-28 ENCOUNTER — Telehealth: Payer: Self-pay

## 2024-02-28 DIAGNOSIS — D689 Coagulation defect, unspecified: Secondary | ICD-10-CM | POA: Diagnosis not present

## 2024-02-28 DIAGNOSIS — D631 Anemia in chronic kidney disease: Secondary | ICD-10-CM | POA: Diagnosis not present

## 2024-02-28 DIAGNOSIS — N186 End stage renal disease: Secondary | ICD-10-CM | POA: Diagnosis not present

## 2024-02-28 DIAGNOSIS — N2581 Secondary hyperparathyroidism of renal origin: Secondary | ICD-10-CM | POA: Diagnosis not present

## 2024-02-28 DIAGNOSIS — M3210 Systemic lupus erythematosus, organ or system involvement unspecified: Secondary | ICD-10-CM | POA: Diagnosis not present

## 2024-02-28 DIAGNOSIS — Z992 Dependence on renal dialysis: Secondary | ICD-10-CM | POA: Diagnosis not present

## 2024-02-28 DIAGNOSIS — D509 Iron deficiency anemia, unspecified: Secondary | ICD-10-CM | POA: Diagnosis not present

## 2024-02-28 NOTE — Telephone Encounter (Signed)
 Copied from CRM [id number]. Topic: Referral - Question >> Feb 28, 2024 11:33 AM Suzen RAMAN wrote: Reason for CRM: Patient would like to speak with the referral coordinator pertaining to recent referrals that need to be re-placed. CB#[phone].

## 2024-03-01 DIAGNOSIS — D509 Iron deficiency anemia, unspecified: Secondary | ICD-10-CM | POA: Diagnosis not present

## 2024-03-01 DIAGNOSIS — D631 Anemia in chronic kidney disease: Secondary | ICD-10-CM | POA: Diagnosis not present

## 2024-03-01 DIAGNOSIS — Z992 Dependence on renal dialysis: Secondary | ICD-10-CM | POA: Diagnosis not present

## 2024-03-01 DIAGNOSIS — N2581 Secondary hyperparathyroidism of renal origin: Secondary | ICD-10-CM | POA: Diagnosis not present

## 2024-03-01 DIAGNOSIS — I132 Hypertensive heart and chronic kidney disease with heart failure and with stage 5 chronic kidney disease, or end stage renal disease: Secondary | ICD-10-CM | POA: Diagnosis not present

## 2024-03-01 DIAGNOSIS — D689 Coagulation defect, unspecified: Secondary | ICD-10-CM | POA: Diagnosis not present

## 2024-03-01 DIAGNOSIS — R52 Pain, unspecified: Secondary | ICD-10-CM | POA: Diagnosis not present

## 2024-03-01 DIAGNOSIS — N186 End stage renal disease: Secondary | ICD-10-CM | POA: Diagnosis not present

## 2024-03-03 DIAGNOSIS — D689 Coagulation defect, unspecified: Secondary | ICD-10-CM | POA: Diagnosis not present

## 2024-03-03 DIAGNOSIS — R52 Pain, unspecified: Secondary | ICD-10-CM | POA: Diagnosis not present

## 2024-03-03 DIAGNOSIS — Z992 Dependence on renal dialysis: Secondary | ICD-10-CM | POA: Diagnosis not present

## 2024-03-03 DIAGNOSIS — D631 Anemia in chronic kidney disease: Secondary | ICD-10-CM | POA: Diagnosis not present

## 2024-03-03 DIAGNOSIS — D509 Iron deficiency anemia, unspecified: Secondary | ICD-10-CM | POA: Diagnosis not present

## 2024-03-03 DIAGNOSIS — N186 End stage renal disease: Secondary | ICD-10-CM | POA: Diagnosis not present

## 2024-03-03 DIAGNOSIS — I132 Hypertensive heart and chronic kidney disease with heart failure and with stage 5 chronic kidney disease, or end stage renal disease: Secondary | ICD-10-CM | POA: Diagnosis not present

## 2024-03-03 DIAGNOSIS — N2581 Secondary hyperparathyroidism of renal origin: Secondary | ICD-10-CM | POA: Diagnosis not present

## 2024-03-06 ENCOUNTER — Emergency Department (HOSPITAL_COMMUNITY)

## 2024-03-06 ENCOUNTER — Inpatient Hospital Stay (HOSPITAL_COMMUNITY)

## 2024-03-06 ENCOUNTER — Encounter (HOSPITAL_COMMUNITY): Payer: Self-pay

## 2024-03-06 ENCOUNTER — Other Ambulatory Visit: Payer: Self-pay

## 2024-03-06 ENCOUNTER — Inpatient Hospital Stay (HOSPITAL_COMMUNITY)
Admission: EM | Admit: 2024-03-06 | Discharge: 2024-03-11 | DRG: 291 | Disposition: A | Discharge status: Home / Self Care | Attending: Internal Medicine | Admitting: Internal Medicine

## 2024-03-06 DIAGNOSIS — I420 Dilated cardiomyopathy: Secondary | ICD-10-CM | POA: Diagnosis not present

## 2024-03-06 DIAGNOSIS — Z888 Allergy status to other drugs, medicaments and biological substances status: Secondary | ICD-10-CM

## 2024-03-06 DIAGNOSIS — I132 Hypertensive heart and chronic kidney disease with heart failure and with stage 5 chronic kidney disease, or end stage renal disease: Secondary | ICD-10-CM | POA: Diagnosis not present

## 2024-03-06 DIAGNOSIS — I161 Hypertensive emergency: Secondary | ICD-10-CM | POA: Diagnosis present

## 2024-03-06 DIAGNOSIS — Z1152 Encounter for screening for COVID-19: Secondary | ICD-10-CM | POA: Diagnosis not present

## 2024-03-06 DIAGNOSIS — J9601 Acute respiratory failure with hypoxia: Secondary | ICD-10-CM | POA: Diagnosis present

## 2024-03-06 DIAGNOSIS — Z781 Physical restraint status: Secondary | ICD-10-CM

## 2024-03-06 DIAGNOSIS — I509 Heart failure, unspecified: Secondary | ICD-10-CM | POA: Diagnosis not present

## 2024-03-06 DIAGNOSIS — R109 Unspecified abdominal pain: Secondary | ICD-10-CM | POA: Diagnosis not present

## 2024-03-06 DIAGNOSIS — Z833 Family history of diabetes mellitus: Secondary | ICD-10-CM

## 2024-03-06 DIAGNOSIS — I10 Essential (primary) hypertension: Secondary | ICD-10-CM

## 2024-03-06 DIAGNOSIS — Z8269 Family history of other diseases of the musculoskeletal system and connective tissue: Secondary | ICD-10-CM

## 2024-03-06 DIAGNOSIS — M329 Systemic lupus erythematosus, unspecified: Secondary | ICD-10-CM | POA: Diagnosis not present

## 2024-03-06 DIAGNOSIS — I3139 Other pericardial effusion (noninflammatory): Secondary | ICD-10-CM | POA: Diagnosis present

## 2024-03-06 DIAGNOSIS — Z992 Dependence on renal dialysis: Secondary | ICD-10-CM

## 2024-03-06 DIAGNOSIS — J9600 Acute respiratory failure, unspecified whether with hypoxia or hypercapnia: Secondary | ICD-10-CM | POA: Diagnosis not present

## 2024-03-06 DIAGNOSIS — I120 Hypertensive chronic kidney disease with stage 5 chronic kidney disease or end stage renal disease: Secondary | ICD-10-CM | POA: Diagnosis not present

## 2024-03-06 DIAGNOSIS — E875 Hyperkalemia: Secondary | ICD-10-CM | POA: Diagnosis present

## 2024-03-06 DIAGNOSIS — K279 Peptic ulcer, site unspecified, unspecified as acute or chronic, without hemorrhage or perforation: Secondary | ICD-10-CM | POA: Diagnosis present

## 2024-03-06 DIAGNOSIS — Z91041 Radiographic dye allergy status: Secondary | ICD-10-CM

## 2024-03-06 DIAGNOSIS — I5043 Acute on chronic combined systolic (congestive) and diastolic (congestive) heart failure: Secondary | ICD-10-CM | POA: Diagnosis not present

## 2024-03-06 DIAGNOSIS — R140 Abdominal distension (gaseous): Secondary | ICD-10-CM | POA: Diagnosis not present

## 2024-03-06 DIAGNOSIS — R0603 Acute respiratory distress: Secondary | ICD-10-CM | POA: Diagnosis not present

## 2024-03-06 DIAGNOSIS — G928 Other toxic encephalopathy: Secondary | ICD-10-CM | POA: Diagnosis not present

## 2024-03-06 DIAGNOSIS — Z4682 Encounter for fitting and adjustment of non-vascular catheter: Secondary | ICD-10-CM | POA: Diagnosis not present

## 2024-03-06 DIAGNOSIS — F17290 Nicotine dependence, other tobacco product, uncomplicated: Secondary | ICD-10-CM | POA: Diagnosis present

## 2024-03-06 DIAGNOSIS — Z743 Need for continuous supervision: Secondary | ICD-10-CM | POA: Diagnosis not present

## 2024-03-06 DIAGNOSIS — J690 Pneumonitis due to inhalation of food and vomit: Secondary | ICD-10-CM

## 2024-03-06 DIAGNOSIS — R6889 Other general symptoms and signs: Secondary | ICD-10-CM | POA: Diagnosis not present

## 2024-03-06 DIAGNOSIS — G8929 Other chronic pain: Secondary | ICD-10-CM | POA: Diagnosis not present

## 2024-03-06 DIAGNOSIS — R1110 Vomiting, unspecified: Secondary | ICD-10-CM

## 2024-03-06 DIAGNOSIS — R112 Nausea with vomiting, unspecified: Secondary | ICD-10-CM | POA: Diagnosis not present

## 2024-03-06 DIAGNOSIS — J9691 Respiratory failure, unspecified with hypoxia: Secondary | ICD-10-CM | POA: Diagnosis not present

## 2024-03-06 DIAGNOSIS — Z885 Allergy status to narcotic agent status: Secondary | ICD-10-CM

## 2024-03-06 DIAGNOSIS — T82898A Other specified complication of vascular prosthetic devices, implants and grafts, initial encounter: Secondary | ICD-10-CM | POA: Diagnosis not present

## 2024-03-06 DIAGNOSIS — F17210 Nicotine dependence, cigarettes, uncomplicated: Secondary | ICD-10-CM | POA: Diagnosis present

## 2024-03-06 DIAGNOSIS — Z79891 Long term (current) use of opiate analgesic: Secondary | ICD-10-CM

## 2024-03-06 DIAGNOSIS — N186 End stage renal disease: Secondary | ICD-10-CM | POA: Diagnosis present

## 2024-03-06 DIAGNOSIS — I5020 Unspecified systolic (congestive) heart failure: Secondary | ICD-10-CM

## 2024-03-06 DIAGNOSIS — Z8249 Family history of ischemic heart disease and other diseases of the circulatory system: Secondary | ICD-10-CM | POA: Diagnosis not present

## 2024-03-06 DIAGNOSIS — I517 Cardiomegaly: Secondary | ICD-10-CM | POA: Diagnosis not present

## 2024-03-06 DIAGNOSIS — R590 Localized enlarged lymph nodes: Secondary | ICD-10-CM | POA: Diagnosis not present

## 2024-03-06 DIAGNOSIS — J811 Chronic pulmonary edema: Secondary | ICD-10-CM | POA: Diagnosis not present

## 2024-03-06 DIAGNOSIS — R160 Hepatomegaly, not elsewhere classified: Secondary | ICD-10-CM | POA: Diagnosis not present

## 2024-03-06 DIAGNOSIS — M3214 Glomerular disease in systemic lupus erythematosus: Secondary | ICD-10-CM | POA: Diagnosis present

## 2024-03-06 DIAGNOSIS — Z79899 Other long term (current) drug therapy: Secondary | ICD-10-CM

## 2024-03-06 DIAGNOSIS — Z841 Family history of disorders of kidney and ureter: Secondary | ICD-10-CM

## 2024-03-06 DIAGNOSIS — J810 Acute pulmonary edema: Secondary | ICD-10-CM | POA: Diagnosis not present

## 2024-03-06 DIAGNOSIS — R1013 Epigastric pain: Secondary | ICD-10-CM | POA: Diagnosis not present

## 2024-03-06 DIAGNOSIS — I5A Non-ischemic myocardial injury (non-traumatic): Secondary | ICD-10-CM | POA: Diagnosis present

## 2024-03-06 DIAGNOSIS — Z91158 Patient's noncompliance with renal dialysis for other reason: Secondary | ICD-10-CM

## 2024-03-06 DIAGNOSIS — K219 Gastro-esophageal reflux disease without esophagitis: Secondary | ICD-10-CM | POA: Diagnosis present

## 2024-03-06 DIAGNOSIS — L989 Disorder of the skin and subcutaneous tissue, unspecified: Secondary | ICD-10-CM | POA: Diagnosis present

## 2024-03-06 DIAGNOSIS — R0989 Other specified symptoms and signs involving the circulatory and respiratory systems: Secondary | ICD-10-CM | POA: Diagnosis not present

## 2024-03-06 DIAGNOSIS — R0600 Dyspnea, unspecified: Secondary | ICD-10-CM | POA: Diagnosis not present

## 2024-03-06 DIAGNOSIS — E781 Pure hyperglyceridemia: Secondary | ICD-10-CM | POA: Diagnosis present

## 2024-03-06 DIAGNOSIS — E8721 Acute metabolic acidosis: Secondary | ICD-10-CM | POA: Diagnosis not present

## 2024-03-06 DIAGNOSIS — D631 Anemia in chronic kidney disease: Secondary | ICD-10-CM | POA: Diagnosis not present

## 2024-03-06 DIAGNOSIS — R188 Other ascites: Secondary | ICD-10-CM

## 2024-03-06 DIAGNOSIS — R239 Unspecified skin changes: Secondary | ICD-10-CM | POA: Diagnosis not present

## 2024-03-06 DIAGNOSIS — T82590A Other mechanical complication of surgically created arteriovenous fistula, initial encounter: Secondary | ICD-10-CM | POA: Diagnosis not present

## 2024-03-06 LAB — RESPIRATORY PANEL BY PCR

## 2024-03-06 LAB — CBC WITH DIFFERENTIAL/PLATELET
Abs Immature Granulocytes: 0.04 K/uL (ref 0.00–0.07)
Basophils Absolute: 0 K/uL (ref 0.0–0.1)
Basophils Relative: 0 %
Eosinophils Absolute: 0.4 K/uL (ref 0.0–0.5)
Eosinophils Relative: 5 %
HCT: 31.4 % — ABNORMAL LOW (ref 36.0–46.0)
Hemoglobin: 9.3 g/dL — ABNORMAL LOW (ref 12.0–15.0)
Immature Granulocytes: 1 %
Lymphocytes Relative: 30 %
Lymphs Abs: 2.3 K/uL (ref 0.7–4.0)
MCH: 27.9 pg (ref 26.0–34.0)
MCHC: 29.6 g/dL — ABNORMAL LOW (ref 30.0–36.0)
MCV: 94.3 fL (ref 80.0–100.0)
Monocytes Absolute: 0.8 K/uL (ref 0.1–1.0)
Monocytes Relative: 11 %
Neutro Abs: 4 K/uL (ref 1.7–7.7)
Neutrophils Relative %: 53 %
Platelets: 206 K/uL (ref 150–400)
RBC: 3.33 MIL/uL — ABNORMAL LOW (ref 3.87–5.11)
RDW: 18.1 % — ABNORMAL HIGH (ref 11.5–15.5)
WBC: 7.5 K/uL (ref 4.0–10.5)
nRBC: 0 % (ref 0.0–0.2)

## 2024-03-06 LAB — COMPREHENSIVE METABOLIC PANEL WITH GFR
ALT: 11 U/L (ref 0–44)
AST: 17 U/L (ref 15–41)
Albumin: 3.1 g/dL — ABNORMAL LOW (ref 3.5–5.0)
Alkaline Phosphatase: 85 U/L (ref 38–126)
Anion gap: 16 — ABNORMAL HIGH (ref 5–15)
BUN: 49 mg/dL — ABNORMAL HIGH (ref 6–20)
CO2: 22 mmol/L (ref 22–32)
Calcium: 9.4 mg/dL (ref 8.9–10.3)
Chloride: 99 mmol/L (ref 98–111)
Creatinine, Ser: 9.36 mg/dL — ABNORMAL HIGH (ref 0.44–1.00)
GFR, Estimated: 5 mL/min — ABNORMAL LOW (ref >60)
Glucose, Bld: 79 mg/dL (ref 70–99)
Potassium: 5.2 mmol/L — ABNORMAL HIGH (ref 3.5–5.1)
Sodium: 137 mmol/L (ref 135–145)
Total Bilirubin: 0.6 mg/dL (ref 0.0–1.2)
Total Protein: 7.5 g/dL (ref 6.5–8.1)

## 2024-03-06 LAB — I-STAT VENOUS BLOOD GAS, ED
Acid-base deficit: 5 mmol/L — ABNORMAL HIGH (ref 0.0–2.0)
Bicarbonate: 20.7 mmol/L (ref 20.0–28.0)
Calcium, Ion: 1.04 mmol/L — ABNORMAL LOW (ref 1.15–1.40)
HCT: 36 % (ref 36.0–46.0)
Hemoglobin: 12.2 g/dL (ref 12.0–15.0)
O2 Saturation: 88 %
Potassium: 5.2 mmol/L — ABNORMAL HIGH (ref 3.5–5.1)
Sodium: 136 mmol/L (ref 135–145)
TCO2: 22 mmol/L (ref 22–32)
pCO2, Ven: 40 mmHg — ABNORMAL LOW (ref 44–60)
pH, Ven: 7.321 (ref 7.25–7.43)
pO2, Ven: 60 mmHg — ABNORMAL HIGH (ref 32–45)

## 2024-03-06 LAB — HCG, SERUM, QUALITATIVE: Preg, Serum: NEGATIVE

## 2024-03-06 LAB — ECHOCARDIOGRAM COMPLETE
AR max vel: 3.12 cm2
AV Area VTI: 2.74 cm2
AV Area mean vel: 2.98 cm2
AV Mean grad: 3 mmHg
AV Peak grad: 4.8 mmHg
Ao pk vel: 1.1 m/s
Area-P 1/2: 3.91 cm2
Calc EF: 29.9 %
Height: 66 in
S' Lateral: 4 cm
Single Plane A2C EF: 28.9 %
Single Plane A4C EF: 33.2 %
Weight: 2645.52 [oz_av]

## 2024-03-06 LAB — URINALYSIS, ROUTINE W REFLEX MICROSCOPIC
Bacteria, UA: NONE SEEN
Bilirubin Urine: NEGATIVE
Glucose, UA: 50 mg/dL — AB
Hgb urine dipstick: NEGATIVE
Ketones, ur: NEGATIVE mg/dL
Leukocytes,Ua: NEGATIVE
Nitrite: NEGATIVE
Protein, ur: 100 mg/dL — AB
Specific Gravity, Urine: 1.005 (ref 1.005–1.030)
pH: 8 (ref 5.0–8.0)

## 2024-03-06 LAB — I-STAT ARTERIAL BLOOD GAS, ED
Acid-base deficit: 1 mmol/L (ref 0.0–2.0)
Bicarbonate: 26.3 mmol/L (ref 20.0–28.0)
Calcium, Ion: 1.13 mmol/L — ABNORMAL LOW (ref 1.15–1.40)
HCT: 35 % — ABNORMAL LOW (ref 36.0–46.0)
Hemoglobin: 11.9 g/dL — ABNORMAL LOW (ref 12.0–15.0)
O2 Saturation: 97 %
Patient temperature: 98.4
Potassium: 5.4 mmol/L — ABNORMAL HIGH (ref 3.5–5.1)
Sodium: 136 mmol/L (ref 135–145)
TCO2: 28 mmol/L (ref 22–32)
pCO2 arterial: 56.5 mmHg — ABNORMAL HIGH (ref 32–48)
pH, Arterial: 7.275 — ABNORMAL LOW (ref 7.35–7.45)
pO2, Arterial: 110 mmHg — ABNORMAL HIGH (ref 83–108)

## 2024-03-06 LAB — CBC
HCT: 34.8 % — ABNORMAL LOW (ref 36.0–46.0)
Hemoglobin: 10.4 g/dL — ABNORMAL LOW (ref 12.0–15.0)
MCH: 28 pg (ref 26.0–34.0)
MCHC: 29.9 g/dL — ABNORMAL LOW (ref 30.0–36.0)
MCV: 93.8 fL (ref 80.0–100.0)
Platelets: 243 K/uL (ref 150–400)
RBC: 3.71 MIL/uL — ABNORMAL LOW (ref 3.87–5.11)
RDW: 18.1 % — ABNORMAL HIGH (ref 11.5–15.5)
WBC: 13.7 K/uL — ABNORMAL HIGH (ref 4.0–10.5)
nRBC: 0 % (ref 0.0–0.2)

## 2024-03-06 LAB — RAPID URINE DRUG SCREEN, HOSP PERFORMED
Amphetamines: NOT DETECTED
Barbiturates: NOT DETECTED
Benzodiazepines: NOT DETECTED
Cocaine: NOT DETECTED
Opiates: NOT DETECTED
Tetrahydrocannabinol: NOT DETECTED

## 2024-03-06 LAB — GLUCOSE, CAPILLARY
Glucose-Capillary: 108 mg/dL — ABNORMAL HIGH (ref 70–99)
Glucose-Capillary: 130 mg/dL — ABNORMAL HIGH (ref 70–99)
Glucose-Capillary: 28 mg/dL — CL (ref 70–99)
Glucose-Capillary: 40 mg/dL — CL (ref 70–99)
Glucose-Capillary: 51 mg/dL — ABNORMAL LOW (ref 70–99)
Glucose-Capillary: 62 mg/dL — ABNORMAL LOW (ref 70–99)
Glucose-Capillary: 64 mg/dL — ABNORMAL LOW (ref 70–99)
Glucose-Capillary: 67 mg/dL — ABNORMAL LOW (ref 70–99)
Glucose-Capillary: 90 mg/dL (ref 70–99)

## 2024-03-06 LAB — HEMOGLOBIN A1C
Hgb A1c MFr Bld: 4.5 % — ABNORMAL LOW (ref 4.8–5.6)
Mean Plasma Glucose: 82.45 mg/dL

## 2024-03-06 LAB — RESP PANEL BY RT-PCR (RSV, FLU A&B, COVID)  RVPGX2
Influenza A by PCR: NEGATIVE
Influenza B by PCR: NEGATIVE
Resp Syncytial Virus by PCR: NEGATIVE
SARS Coronavirus 2 by RT PCR: NEGATIVE

## 2024-03-06 LAB — MRSA NEXT GEN BY PCR, NASAL: MRSA by PCR Next Gen: NOT DETECTED

## 2024-03-06 LAB — CREATININE, SERUM
Creatinine, Ser: 9.76 mg/dL — ABNORMAL HIGH (ref 0.44–1.00)
GFR, Estimated: 5 mL/min — ABNORMAL LOW (ref >60)

## 2024-03-06 LAB — HEPATITIS B SURFACE ANTIGEN: Hepatitis B Surface Ag: NONREACTIVE

## 2024-03-06 MED ORDER — MIDAZOLAM HCL 2 MG/2ML IJ SOLN
INTRAMUSCULAR | Status: AC
  Administered 2024-03-06: 2 mg via INTRAVENOUS
  Filled 2024-03-06: qty 2

## 2024-03-06 MED ORDER — DEXTROSE 50 % IV SOLN
INTRAVENOUS | Status: AC
  Filled 2024-03-06: qty 50

## 2024-03-06 MED ORDER — DEXTROSE 50 % IV SOLN
12.5000 g | INTRAVENOUS | Status: AC
  Administered 2024-03-06: 12.5 g via INTRAVENOUS
  Filled 2024-03-06: qty 50

## 2024-03-06 MED ORDER — VANCOMYCIN HCL 1500 MG/300ML IV SOLN
1500.0000 mg | Freq: Once | INTRAVENOUS | Status: AC
  Administered 2024-03-06: 1500 mg via INTRAVENOUS
  Filled 2024-03-06: qty 300

## 2024-03-06 MED ORDER — SODIUM ZIRCONIUM CYCLOSILICATE 10 G PO PACK
10.0000 g | PACK | Freq: Once | ORAL | Status: AC
  Administered 2024-03-06: 10 g
  Filled 2024-03-06: qty 1

## 2024-03-06 MED ORDER — FENTANYL CITRATE PF 50 MCG/ML IJ SOSY
50.0000 ug | PREFILLED_SYRINGE | INTRAMUSCULAR | Status: DC | PRN
  Administered 2024-03-06 – 2024-03-07 (×5): 100 ug via INTRAVENOUS
  Filled 2024-03-06 (×2): qty 2
  Filled 2024-03-06: qty 1
  Filled 2024-03-06 (×3): qty 2

## 2024-03-06 MED ORDER — DROPERIDOL 2.5 MG/ML IJ SOLN
1.2500 mg | Freq: Once | INTRAMUSCULAR | Status: AC
  Administered 2024-03-06: 1.25 mg via INTRAVENOUS
  Filled 2024-03-06: qty 2

## 2024-03-06 MED ORDER — SODIUM BICARBONATE 8.4 % IV SOLN: 50.0000 meq | Freq: Once | INTRAVENOUS | Status: AC

## 2024-03-06 MED ORDER — SODIUM ZIRCONIUM CYCLOSILICATE 10 G PO PACK
10.0000 g | PACK | ORAL | Status: AC
  Administered 2024-03-06: 10 g via ORAL
  Filled 2024-03-06: qty 1

## 2024-03-06 MED ORDER — PANTOPRAZOLE SODIUM 40 MG IV SOLR
40.0000 mg | INTRAVENOUS | Status: DC
  Administered 2024-03-06: 40 mg via INTRAVENOUS
  Filled 2024-03-06: qty 10

## 2024-03-06 MED ORDER — DOCUSATE SODIUM 50 MG/5ML PO LIQD: 100.0000 mg | Freq: Two times a day (BID) | ORAL | Status: DC

## 2024-03-06 MED ORDER — ORAL CARE MOUTH RINSE: 15.0000 mL | OROMUCOSAL | Status: DC | PRN

## 2024-03-06 MED ORDER — ENOXAPARIN SODIUM 30 MG/0.3ML IJ SOSY
30.0000 mg | PREFILLED_SYRINGE | INTRAMUSCULAR | Status: DC
  Administered 2024-03-06: 30 mg via SUBCUTANEOUS
  Filled 2024-03-06: qty 0.3

## 2024-03-06 MED ORDER — DEXTROSE 50 % IV SOLN
25.0000 g | INTRAVENOUS | Status: AC
  Administered 2024-03-06: 25 g via INTRAVENOUS

## 2024-03-06 MED ORDER — FENTANYL CITRATE PF 50 MCG/ML IJ SOSY
PREFILLED_SYRINGE | INTRAMUSCULAR | Status: AC
  Administered 2024-03-06: 100 ug via INTRAVENOUS
  Filled 2024-03-06: qty 1

## 2024-03-06 MED ORDER — FUROSEMIDE 10 MG/ML IJ SOLN
120.0000 mg | Freq: Once | INTRAVENOUS | Status: DC
  Filled 2024-03-06: qty 12

## 2024-03-06 MED ORDER — PROPOFOL 1000 MG/100ML IV EMUL
0.0000 ug/kg/min | INTRAVENOUS | Status: DC
  Administered 2024-03-06: 45 ug/kg/min via INTRAVENOUS
  Administered 2024-03-06: 60 ug/kg/min via INTRAVENOUS
  Administered 2024-03-06: 50 ug/kg/min via INTRAVENOUS
  Administered 2024-03-07 (×2): 70 ug/kg/min via INTRAVENOUS
  Filled 2024-03-06 (×5): qty 100

## 2024-03-06 MED ORDER — POLYETHYLENE GLYCOL 3350 17 G PO PACK: 17.0000 g | PACK | Freq: Every day | ORAL | Status: DC

## 2024-03-06 MED ORDER — HYDRALAZINE HCL 20 MG/ML IJ SOLN
INTRAMUSCULAR | Status: AC
  Administered 2024-03-06: 20 mg via INTRAVENOUS
  Filled 2024-03-06: qty 1

## 2024-03-06 MED ORDER — HYDRALAZINE HCL 20 MG/ML IJ SOLN: 20.0000 mg | Freq: Once | INTRAMUSCULAR | Status: AC

## 2024-03-06 MED ORDER — DEXTROSE 50 % IV SOLN
INTRAVENOUS | Status: AC
  Administered 2024-03-06: 50 mL
  Filled 2024-03-06: qty 50

## 2024-03-06 MED ORDER — VANCOMYCIN VARIABLE DOSE PER UNSTABLE RENAL FUNCTION (PHARMACIST DOSING): Status: DC

## 2024-03-06 MED ORDER — KETOROLAC TROMETHAMINE 30 MG/ML IJ SOLN: 30.0000 mg | Freq: Once | INTRAMUSCULAR | Status: DC

## 2024-03-06 MED ORDER — NITROGLYCERIN IN D5W 200-5 MCG/ML-% IV SOLN
0.0000 ug/min | INTRAVENOUS | Status: DC
  Administered 2024-03-06: 10 ug/min via INTRAVENOUS
  Filled 2024-03-06: qty 250

## 2024-03-06 MED ORDER — FENTANYL CITRATE PF 50 MCG/ML IJ SOSY: 100.0000 ug | PREFILLED_SYRINGE | Freq: Once | INTRAMUSCULAR | Status: AC

## 2024-03-06 MED ORDER — PROPOFOL 1000 MG/100ML IV EMUL
INTRAVENOUS | Status: AC
  Administered 2024-03-06: 15 ug/kg/min via INTRAVENOUS
  Filled 2024-03-06: qty 100

## 2024-03-06 MED ORDER — ROCURONIUM BROMIDE 10 MG/ML (PF) SYRINGE
50.0000 mg | PREFILLED_SYRINGE | Freq: Once | INTRAVENOUS | Status: AC
  Administered 2024-03-06: 50 mg via INTRAVENOUS

## 2024-03-06 MED ORDER — FAMOTIDINE IN NACL 20-0.9 MG/50ML-% IV SOLN
20.0000 mg | Freq: Once | INTRAVENOUS | Status: AC
  Administered 2024-03-06: 20 mg via INTRAVENOUS
  Filled 2024-03-06: qty 50

## 2024-03-06 MED ORDER — PIPERACILLIN-TAZOBACTAM IN DEX 2-0.25 GM/50ML IV SOLN
2.2500 g | Freq: Three times a day (TID) | INTRAVENOUS | Status: DC
  Administered 2024-03-06 – 2024-03-11 (×14): 2.25 g via INTRAVENOUS
  Filled 2024-03-06 (×17): qty 50

## 2024-03-06 MED ORDER — FUROSEMIDE 10 MG/ML IJ SOLN
40.0000 mg | Freq: Once | INTRAMUSCULAR | Status: AC
  Administered 2024-03-06: 40 mg via INTRAVENOUS
  Filled 2024-03-06: qty 4

## 2024-03-06 MED ORDER — DEXTROSE 5 % IV SOLN: INTRAVENOUS | Status: DC

## 2024-03-06 MED ORDER — ORAL CARE MOUTH RINSE
15.0000 mL | OROMUCOSAL | Status: DC
  Administered 2024-03-06 – 2024-03-07 (×12): 15 mL via OROMUCOSAL

## 2024-03-06 MED ORDER — INSULIN ASPART 100 UNIT/ML IJ SOLN: 0.0000 [IU] | INTRAMUSCULAR | Status: DC

## 2024-03-06 MED ORDER — HEPARIN SODIUM (PORCINE) 5000 UNIT/ML IJ SOLN: 5000.0000 [IU] | Freq: Three times a day (TID) | INTRAMUSCULAR | Status: DC

## 2024-03-06 MED ORDER — VANCOMYCIN HCL 750 MG/150ML IV SOLN
750.0000 mg | Freq: Once | INTRAVENOUS | Status: AC
  Administered 2024-03-06: 750 mg via INTRAVENOUS
  Filled 2024-03-06: qty 150

## 2024-03-06 MED ORDER — CHLORHEXIDINE GLUCONATE CLOTH 2 % EX PADS
6.0000 | MEDICATED_PAD | Freq: Every day | CUTANEOUS | Status: DC
  Administered 2024-03-06 – 2024-03-07 (×2): 6 via TOPICAL

## 2024-03-06 MED ORDER — DEXTROSE 10 % IV SOLN: INTRAVENOUS | Status: DC

## 2024-03-06 MED ORDER — FENTANYL CITRATE PF 50 MCG/ML IJ SOSY
50.0000 ug | PREFILLED_SYRINGE | INTRAMUSCULAR | Status: AC | PRN
  Administered 2024-03-06 (×3): 50 ug via INTRAVENOUS
  Filled 2024-03-06 (×2): qty 1

## 2024-03-06 MED ORDER — PIPERACILLIN-TAZOBACTAM 3.375 G IVPB 30 MIN
3.3750 g | Freq: Once | INTRAVENOUS | Status: AC
  Administered 2024-03-06: 3.375 g via INTRAVENOUS
  Filled 2024-03-06: qty 50

## 2024-03-06 MED ORDER — ETOMIDATE 2 MG/ML IV SOLN
20.0000 mg | Freq: Once | INTRAVENOUS | Status: AC
  Administered 2024-03-06: 20 mg via INTRAVENOUS

## 2024-03-06 MED ORDER — FENTANYL CITRATE PF 50 MCG/ML IJ SOSY
PREFILLED_SYRINGE | INTRAMUSCULAR | Status: AC
  Filled 2024-03-06: qty 1

## 2024-03-06 MED ORDER — SODIUM BICARBONATE 8.4 % IV SOLN
INTRAVENOUS | Status: AC
  Administered 2024-03-06: 50 meq via INTRAVENOUS
  Filled 2024-03-06: qty 50

## 2024-03-06 MED ORDER — MIDAZOLAM HCL 2 MG/2ML IJ SOLN
1.0000 mg | INTRAMUSCULAR | Status: DC | PRN
  Administered 2024-03-06 (×2): 2 mg via INTRAVENOUS
  Administered 2024-03-06 – 2024-03-07 (×2): 4 mg via INTRAVENOUS
  Administered 2024-03-07: 2 mg via INTRAVENOUS
  Filled 2024-03-06 (×2): qty 2
  Filled 2024-03-06 (×2): qty 4
  Filled 2024-03-06 (×2): qty 2

## 2024-03-06 MED ORDER — LABETALOL HCL 5 MG/ML IV SOLN
5.0000 mg | INTRAVENOUS | Status: DC | PRN
  Administered 2024-03-06: 10 mg via INTRAVENOUS
  Filled 2024-03-06 (×2): qty 4

## 2024-03-06 NOTE — ED Notes (Signed)
 Nephrology at bedside, continuing to tell pt to keep O2 on, pt keeps removing NRB, pt satting mid 90s

## 2024-03-06 NOTE — ED Notes (Signed)
 Per tech, pt was found in bathroom  Pt in bathroom, pt c/o sob, pt has pulled out iv, bleeding is controlled/stopped, pt back to room via wheel chair, pt has elevated rr and is diaphoretic, pt placed on nrb.

## 2024-03-06 NOTE — ED Provider Notes (Signed)
 Called to bedside to this admitted patient. 28 year old female end-stage renal disease on dialysis being admitted for pulmonary edema.  Called to patient's bedside reports that she had to the bathroom and collapsed in the bathroom and was extremely tachypneic and hypoxic.  No injuries were noted.  Patient was moved back to her bed blood pressure is 187 systolically with heart rate of 140s with sats in the 50s.  Nonrebreather is placed. Review of records the patient presented complaining of nausea vomiting and diarrhea Labs reviewed and significant for creatinine 9.3 and potassium 5.2 with mild anemia of 9.3 CT of the abdomen was obtained due to abdominal pain.  It noted cardiomegaly and small bilateral pleural effusions. Patient is on nonrebreather.  I have ordered BiPAP.  Nursing have attempted to contact hospitalist who have admitted the patient   Levander Houston, MD 03/07/24 1655

## 2024-03-06 NOTE — ED Notes (Signed)
 Pt intubated with 7.5 et tube, 24 at the lip, color change, bilateral breath sounds, vocal cords visualized.

## 2024-03-06 NOTE — Consult Note (Signed)
 Renal Service Consult Note [facility]  Linda [name] [place] 03/06/2024 Linda [name] Linda Fret, [name] Requesting Physician: Dr. Geronimo  Reason for Consult: ESRD pt w/ resp distress HPI: The patient is a 28 y.o. year-old w/ PMH as below who presented to ED early am c/o abd pain, N/V for several days. Later in the ED around 7 am pt found in bathroom c/o SOB w/ diaphoresis. Pt placed on nrb but didn't tolerate. Bipap was tried, also didn't tolerate. BP'[name] in ED were high 198/ 140, IV ntg was started. CXR showed bilat pulm edema. We were asked to see for dialysis.    Pt seen in ED room. Pt was diaphoretic, breathing hard, a bit agitated and moving about the bed. SpO2 90% w/ partial nrb. CCM was consulted and are planning intubation.    ROS - denies CP, no joint pain, no HA, no blurry vision, no rash, no diarrhea, no nausea/ vomiting   Past Medical History  Past Medical History:  Diagnosis Date   Abnormal ECG    a. In setting of LVH w/ repolarization abnormalities; b. 03/2019 MV: No ischemia/infarct. EF 55-65%.   Angioedema    ESRD on hemodialysis (HCC)    GERD (gastroesophageal reflux disease)    Hypertension    Lupus    LVH (left ventricular hypertrophy)    a. 11/2017 Echo: EF 50-55%. triv AI. Mild MR. Mod L and mild R atrial enlargement. Nl RV size/fxn. Small pericardial effusion w/o tampondae; b. 03/2018 Echo: EF 55-60%, restrictive filling pattern. Nl RV size/fxn. Sev LAE. Mild MR, mild to mod TR/PR. Mod PAH. Triv effusion.   Migraines    Mixed connective tissue disease (HCC)    Previous Dialysis patient [facility])    a. Off HD since late 2020.   Septic prepatellar bursitis of right knee 08/19/2017   Past Surgical History  Past Surgical History:  Procedure Laterality Date   GRAFT APPLICATION Left 03/30/2019   Procedure: BRACHIAL CEPHALIC GRAFT CREATION;  Surgeon: Linda Selinda RAMAN, [name];  Location: [facility] ORS;  Service: Vascular;  Laterality: Left;   I & D EXTREMITY Right 08/06/2017    Procedure: IRRIGATION AND DEBRIDEMENT KNEE;  Surgeon: Linda Franky SQUIBB, [name];  Location: [facility] OR;  Service: Orthopedics;  Laterality: Right;   IR THROMBECTOMY AV FISTULA W/THROMBOLYSIS/PTA/STENT INC/SHUNT/IMG LT Left 05/18/2022   IR US  GUIDE VASC ACCESS LEFT  05/18/2022   MULTIPLE TOOTH EXTRACTIONS     Family History  Family History  Problem Relation Age of Onset   Lupus Mother    Hypertension Mother    Kidney disease Mother    Heart Problems Mother    Coronary artery disease Mother 8       stent   Diabetes Maternal Grandmother    Social History  reports that she has been smoking e-cigarettes and cigarettes. She started smoking about 9 years ago. She has a 2.3 pack-year smoking history. She has never used smokeless tobacco. She reports that she does not drink alcohol and does not use drugs. Allergies  Allergies  Allergen Reactions   Iodine  Hives and Shortness Of Breath   Lisinopril  Anaphylaxis    Angioedema   Gabapentin Other (See Comments)    Reaction: Tremor (intolerance); tremor   Ativan  [Lorazepam ] Other (See Comments)    Somnolent with 1mg  ativan  at [facility] Nov 2019. Patient stated it made her feel like she shut down and was mute and non-responding.   Hydroxychloroquine  Other (See Comments)    Pt reports making her skin peel  really bad   Vicodin [Hydrocodone -Acetaminophen ] Itching and Rash   Home medications Prior to Admission medications   Medication Sig Start Date End Date Taking? Authorizing Provider  allopurinol  (ZYLOPRIM ) 100 MG tablet Take 1 tablet (100 mg total) by mouth daily. 02/13/24   Linda [name]  amLODipine  (NORVASC ) 10 MG tablet Take 10 mg by mouth at bedtime. 08/05/21   Provider, Historical, [name]  furosemide  (LASIX ) 80 MG tablet Take 1 tablet (80 mg total) by mouth daily. Patient taking differently: Take 80 mg by mouth 3 (three) times daily. 12/23/23   Linda Frames, [name]  irbesartan  (AVAPRO ) 300 MG tablet Take 1 tablet (300 mg total) by mouth daily.  02/03/24   Linda [name]  lidocaine -prilocaine  (EMLA ) cream Apply 1 Application topically once. 01/14/24   Provider, Historical, [name]  medroxyPROGESTERone  (DEPO-PROVERA ) 150 MG/ML injection Inject 1 mL (150 mg total) into the muscle every 3 (three) months. 10/29/22   Linda Carlin LABOR, [name]  nitroGLYCERIN  (NITROSTAT ) 0.4 MG SL tablet Place 1 tablet (0.4 mg total) under the tongue every 5 (five) minutes as needed for chest pain. 10/28/23   Linda [name], Linda POUR, [name]  ondansetron  (ZOFRAN -ODT) 4 MG disintegrating tablet Take 1 tablet (4 mg total) by mouth every 8 (eight) hours as needed for nausea or vomiting. 10/28/23   Linda [name], Linda [name], [name]  oxyCODONE  (OXY IR/ROXICODONE ) 5 MG immediate release tablet Take 1 tablet (5 mg total) by mouth every 4 (four) hours as needed for moderate pain (pain score 4-6) or breakthrough pain. 02/12/24   Linda [name]  sevelamer  carbonate (RENVELA ) 2.4 g PACK Take 2.4 g by mouth 2 (two) times daily with a meal. 01/21/24   Provider, Historical, [name]  tiZANidine  (ZANAFLEX ) 4 MG tablet Take 4 mg by mouth every 8 (eight) hours as needed for muscle spasms. 12/03/23   Provider, Historical, [name]     Vitals:   03/06/24 0940 03/06/24 0945 03/06/24 0956 03/06/24 1006  BP: (!) 196/150 (!) 198/147  (!) 176/133  Pulse:    (!) 139  Resp: 20 20  (!) 22  Temp:      TempSrc:    Axillary  SpO2:   100% 100%  Weight:      Height:       Exam Gen diaphoretic, RR low 30s, HR 130s, 190/140 No rash, cyanosis or gangrene Sclera anicteric, throat clear  No jvd or bruits Chest bilat crackles 1/3-1/2 up RRR no MRG, tachy Abd soft ntnd no mass or ascites +bs GU defer MS no joint effusions or deformity Ext no pitting LE/ UE edema, no other edema Neuro is alert, nonfocal, ox 3    LUE AVG+bruit      OP HD: AF MWF From 01/24/24 --> 3.5h   B350  71.5kg   AVG LUE  Hep none    Assessment/ Plan: Resp distress: w/ pulm edema by CXR and exam, severe uncont HTN. Getting IV ntg gtt, and given IV  hydralazine  x 1 w/o improvement in resp distress. CCM to intubated. Plan to dialyze later in ICU.  ESRD: on HD MWF. HD as above.  Uncont HTN: cont IV prns and ntg as needed Volume: get weights, not overtly edematous Anemia of esrd: Hb 9-12 here, follow.    Myer Fret  [name] CKA 03/06/2024, 10:16 AM  Recent Labs  Lab 03/06/24 0417 03/06/24 0845  HGB 9.3* 12.2  ALBUMIN  3.1*  --   CALCIUM  9.4  --   CREATININE 9.36*  --   [name]  5.2* 5.2*   Inpatient medications:  docusate  100 mg Per Tube BID   enoxaparin  (LOVENOX ) injection  30 mg Subcutaneous Q24H   insulin  aspart  0-6 Units Subcutaneous Q4H   polyethylene glycol  17 g Per Tube Daily   sodium zirconium cyclosilicate   10 g Per Tube Once    famotidine  (PEPCID ) IV     propofol  (DIPRIVAN ) infusion 25 mcg/kg/min (03/06/24 0957)   fentaNYL  (SUBLIMAZE ) injection, fentaNYL  (SUBLIMAZE ) injection, labetalol , midazolam 

## 2024-03-06 NOTE — TOC Initial Note (Signed)
 Transition of Care [facility]) - Initial/Assessment Note    Patient Details  Name: Linda [name] MRN: [id number] Date of Birth: 08/31/1996  [facility] CM/SW Contact:    Lauraine FORBES Saa, LCSW Phone Number: 03/06/2024, 1:41 PM  Clinical Narrative:                  1:42 PM Per chart review, patient is currently intubated. CSW spoke with patient's aunt, Karna Gallop via patient's grandmother's phone. Karna confirmed she is maternal aunt of patient and that patient's mother has died. Karna confirmed patient does not have SNF/HH/DME history. Karna confirmed patient resides at home with her, patient's grandmother, patient's uncle, and patient's two adult cousins. Karna confirmed she could provide transportation for patient upon discharge. Per chart review, patient has a PCP and insurance. Patient's preferred pharmacy's are [facility] and [phone].     Barriers to Discharge: Continued Medical Work up   Patient Goals and CMS Choice            Expected Discharge Plan and Services       Living arrangements for the past 2 months: Single Family Home                                      Prior Living Arrangements/Services Living arrangements for the past 2 months: Single Family Home Lives with:: Relatives Patient language and need for interpreter reviewed:: Yes        Need for Family Participation in Patient Care: Yes (Comment) Care giver support system in place?: Yes (comment)   Criminal Activity/Legal Involvement Pertinent to Current Situation/Hospitalization: No - Comment as needed  Activities of Daily Living      Permission Sought/Granted Permission sought to share information with : Family Supports Permission granted to share information with : No (Contact information on chart)  Share Information with NAME: Roslin Norwood     Permission granted to share info w Relationship: Grandmother  Permission granted to share  info w Contact Information: [phone]  Emotional Assessment Appearance:: Appears stated age Attitude/Demeanor/Rapport: Unable to Assess, Intubated (Following Commands or Not Following Commands) Affect (typically observed): Unable to Assess   Alcohol / Substance Use: Not Applicable Psych Involvement: No (comment)  Admission diagnosis:  Acute pulmonary edema (HCC) [J81.0] Other ascites [R18.8] Intractable vomiting [R11.10] Acute hypoxemic respiratory failure (HCC) [J96.01] Patient Active Problem List   Diagnosis Date Noted   Acute hypoxemic respiratory failure (HCC) 03/06/2024   Renal failure 02/09/2024   Hypoglycemia 02/08/2024   Anemia of chronic disease 02/08/2024   Fluid overload 01/12/2024   Abscess of buttock, right 01/11/2024   Hyperkalemia 12/22/2023   Positive D dimer 12/22/2023   Skin lesions 11/16/2023   Chronic health problem 11/14/2023   Abdominal pain 11/14/2023   Dyspnea 11/14/2023   SOB (shortness of breath) 10/24/2023   Pericardial effusion 10/24/2023   Lupus nephritis (HCC) 10/24/2023   ESRD (end stage renal disease) on dialysis (HCC) 10/24/2023   Chronic heart failure with preserved ejection fraction (HCC) 10/24/2023   Acute hypoxic respiratory failure (HCC) 10/10/2023   CAP (community acquired pneumonia) 10/10/2023   Acute on chronic heart failure with mildly reduced ejection fraction (HFmrEF, 41-49%) (HCC) 10/10/2023   History of anemia due to chronic kidney disease 10/10/2023   Pulmonary edema 09/11/2023   Shortness of breath 09/11/2023   Pain in multiple muscles 09/11/2023   Nausea vomiting  and diarrhea 09/01/2023   ESRD (end stage renal disease) (HCC) 09/30/2021   Acute pulmonary edema (HCC) 09/30/2021   GERD without esophagitis 09/30/2021   Volume overload 09/02/2021   High anion gap metabolic acidosis 09/02/2021   Macrocytic anemia 09/02/2021   Elevated troponin 09/02/2021   Anxiety 11/03/2019   Strain of sternocleidomastoid muscle  11/03/2019   Difficulty with speech 11/03/2019   Prolonged QT interval 03/02/2019   Chronic heart failure with preserved ejection fraction (HFpEF) (HCC) 03/02/2019   ESRD on dialysis (HCC) 01/10/2019   Calciphylaxis 12/27/2018   Tobacco abuse 03/02/2018   Severe sepsis (HCC) 01/06/2018   Angioedema 01/02/2018   Tachycardia 01/02/2018   Chronic kidney disease, stage 5 (HCC)    Hypertensive urgency 11/29/2017   Grief reaction 08/19/2017   Right knee pain    Bilateral knee swelling 04/20/2017   Systemic lupus erythematosus (HCC)    Essential hypertension 12/11/2016   Mixed connective tissue disease (HCC) 02/11/2016   Implanon  in place 01/19/2014   PCP:  [name], NP Pharmacy:   [id number] [place] [place] - [address] [place] [address] Phone: [phone] Fax: [phone]  [facility] [address] [place] [address] Phone: [phone] Fax: [phone]     Social Drivers of Health (SDOH) Social History: SDOH Screenings   Food Insecurity: No Food Insecurity (02/09/2024)  Recent Concern: Food Insecurity - Food Insecurity Present (11/25/2023)  Housing: Low Risk  (02/09/2024)  Recent Concern: Housing - High Risk (01/11/2024)  Transportation Needs: Unmet Transportation Needs (02/09/2024)  Utilities: Not At Risk (02/09/2024)  Recent Concern: Utilities - At Risk (01/11/2024)  Alcohol Screen: Low Risk  (02/03/2024)  Depression (PHQ2-9): Low Risk  (02/03/2024)  Recent Concern: Depression (PHQ2-9) - Medium Risk (01/26/2024)  Financial Resource Strain: Low Risk  (02/03/2024)  Physical Activity: Insufficiently Active (02/03/2024)  Social Connections: Socially Isolated (02/03/2024)  Stress: No Stress Concern Present (02/03/2024)  Tobacco Use: High Risk (03/06/2024)  Health Literacy: Adequate Health Literacy (02/03/2024)   SDOH Interventions:     Readmission Risk Interventions    11/15/2023    2:53 PM 10/25/2023     3:32 PM 10/02/2021    9:11 AM  Readmission Risk Prevention Plan  Transportation Screening Complete Complete   PCP or Specialist Appt within 3-5 Days   Complete  HRI or Home Care Consult  Complete Complete  Social Work Consult for Recovery Care Planning/Counseling  Complete   Palliative Care Screening  Complete   Medication Review Ocean[facility]) Referral to Pharmacy Referral to Pharmacy   PCP or Specialist appointment within 3-5 days of discharge Complete    HRI or Home Care Consult Complete    SW Recovery Care/Counseling Consult Complete    Palliative Care Screening Not Applicable    Skilled Nursing Facility Not Applicable

## 2024-03-06 NOTE — ED Notes (Signed)
 Pt in bed, pt diaphoretic pt tacy at 139, pt has some movement.

## 2024-03-06 NOTE — ED Notes (Signed)
 Icu md, rt, pharm and second rn at bedside to intubate pt.

## 2024-03-06 NOTE — Progress Notes (Addendum)
 eLink Physician-Brief Progress Note Patient Name: Linda [name] DOB: 16-Jun-1996 MRN: [id number]   Date of Service  03/06/2024  HPI/Events of Note  pt has had issues with low CBGs, can they get something with dextrose , pt is not on any tube feed ESRD.CHF. low EF.    eICU Interventions  Dextrose  ordered     Intervention Category Intermediate Interventions: Other:  Jodelle ONEIDA Hutching 03/06/2024, 8:24 PM  22:49 Camera alert: Asking for restraints, on Ventilator, safety risks, trying to get up, reaching for tubes. Bilateral soft wrist restraints request from bed side RN to prevent self injury and harm while critically ill is ordered.  23:25 CBG  was 67, do you want to increase the D5, currently running at 33ml/hr  Changed to D10 at 20 ml/hr.    00:38 CBG still low can they go up on the D10 - increased to 40 ml/hr.   03:34 Dextrose  10 going in peripheral line at 40 ml/hr, still hypoglycemic.  D 50 push stat. Increase D10 to 75  ml/hr.via 20 G PI for now. Try to get 18 G line . Watch for any reddness, extravasation, pain in area.  Hx of lupus. Home meds reviewed. On narcotics. Not on any steroids that could cuse adrenal insufficiency.

## 2024-03-06 NOTE — Progress Notes (Signed)
 Transported pt from ED26 to 3M08 with bedside RN. Vitals are stable.

## 2024-03-06 NOTE — ED Notes (Signed)
 Iv attempt unsuccessful, iv placed via ultrasound on first ultrasound attempt

## 2024-03-06 NOTE — ED Triage Notes (Signed)
 Pt BIBEMS from home came to ED c/o abdominal pain with nausea and vomiting for the past few days

## 2024-03-06 NOTE — Progress Notes (Signed)
 Pt not tolerating BiPAP for more than 1 min at a time and only if this RT is physically holding to her face. States she can't breathe. MD made aware

## 2024-03-06 NOTE — H&P (Signed)
 NAME:  Linda [name], MRN:  [id number], DOB:  1995-09-06, LOS: 0 ADMISSION DATE:  03/06/2024, CONSULTATION DATE:  03/06/2024 REFERRING MD:  EDP DR D Ray and [facility] [facility], CHIEF COMPLAINT:  Acute resp failure due to volume overload    History of Present Illness:   28 year old female with a history of lupus, discoid lupus with previous history of thalidomide.  End-stage renal disease on Monday Wednesday Friday hemodialysis, chronic systolic dysfunction with EF 45%, chronic pain due to calciphylaxis, chronic lesions in the gluteal areas.  [Present for at least 1 year] and status post punch biopsy February 11, 2024 with biopsy results consistent with lipoma.  She has a history of volume overload [CT June 2025 showed small volume ascites cardiomegaly and pulmonary edema and small pericardial effusion].  She has chronic anemia with hemoglobin 8.3 at baseline  Recent admissions include late February 2025 for missing dialysis send respiratory failure volume overload, late March 2025 for respiratory failure volume overload missing dialysis sessions, third week April 2025 with respiratory failure volume overload after missing hemodialysis sessions, mid May 2025 volume overload with respiratory failure and hypertension [systolic 160/diastolic 120] late May 2025 2-day admission for acute hypoxic respiratory failure due to missed hemodialysis sessions and volume overload, 4-day admission ending February 12, 2024 for volume overload and secondary hypoxemic respiratory failure requiring dialysis during which she got the skin biopsy/punch biopsy in the gluteal region  On 03/06/2024: Presents to the ED with abdominal pain nausea vomiting and diarrhea..  Initially triaged to the hospitalist service but at some point went to the bathroom and collapsed.  She said she was short of breath.  IV site was bleeding but this was controlled.  Was diaphoretic placed on nonrebreather.  Subsequently moved to BiPAP.  No injuries  documented by EDP.  Systolic blood pressure was extremely high 187 with a heart rate in the 140s.  Saturations in the 50s.  Chest x-ray with large volume overload.  Was seen by renal but was in respiratory extremis and CCM called urgently  PEr Grandmother patient has been compliant with HD -   Intubated by Dr. Geronimo in the ED.  Past Medical History:    has a past medical history of Abnormal ECG, Angioedema, ESRD on hemodialysis (HCC), GERD (gastroesophageal reflux disease), Hypertension, Lupus, LVH (left ventricular hypertrophy), Migraines, Mixed connective tissue disease (HCC), Previous Dialysis patient [facility]), and Septic prepatellar bursitis of right knee (08/19/2017).   reports that she has been smoking e-cigarettes and cigarettes. She started smoking about 9 years ago. She has a 2.3 pack-year smoking history. She has never used smokeless tobacco.  Past Surgical History:  Procedure Laterality Date   GRAFT APPLICATION Left 03/30/2019   Procedure: BRACHIAL CEPHALIC GRAFT CREATION;  Surgeon: Marea Selinda RAMAN, MD;  Location: [facility] ORS;  Service: Vascular;  Laterality: Left;   I & D EXTREMITY Right 08/06/2017   Procedure: IRRIGATION AND DEBRIDEMENT KNEE;  Surgeon: Kendal Franky SQUIBB, MD;  Location: [facility] OR;  Service: Orthopedics;  Laterality: Right;   IR THROMBECTOMY AV FISTULA W/THROMBOLYSIS/PTA/STENT INC/SHUNT/IMG LT Left 05/18/2022   IR US  GUIDE VASC ACCESS LEFT  05/18/2022   MULTIPLE TOOTH EXTRACTIONS      Allergies  Allergen Reactions   Iodine  Hives and Shortness Of Breath   Lisinopril  Anaphylaxis    Angioedema   Gabapentin Other (See Comments)    Reaction: Tremor (intolerance); tremor   Ativan  [Lorazepam ] Other (See Comments)    Somnolent with 1mg  ativan  at [facility] Nov 2019.  Patient stated it made her feel like she shut down and was mute and non-responding.   Hydroxychloroquine  Other (See Comments)    Pt reports making her skin peel really bad   Vicodin [Hydrocodone -Acetaminophen ]  Itching and Rash    Immunization History  Administered Date(s) Administered   Influenza,inj,Quad PF,6+ Mos 05/19/2018   Influenza-Unspecified 08/19/2012   Meningococcal B Recombinant 03/28/2018, 05/19/2018   Meningococcal Conjugate 03/28/2018, 03/28/2018   PFIZER Comirnaty(Gray Top)Covid-19 Tri-Sucrose Vaccine 07/15/2021    Family History  Problem Relation Age of Onset   Lupus Mother    Hypertension Mother    Kidney disease Mother    Heart Problems Mother    Coronary artery disease Mother 41       stent   Diabetes Maternal Grandmother      Current Facility-Administered Medications:    docusate (COLACE) 50 MG/5ML liquid 100 mg, 100 mg, Per Tube, BID, Geronimo Amel, MD   enoxaparin  (LOVENOX ) injection 30 mg, 30 mg, Subcutaneous, Q24H, Georgina Basket, MD   famotidine  (PEPCID ) IVPB 20 mg premix, 20 mg, Intravenous, Once, Palumbo, April, MD   labetalol  (NORMODYNE ) injection 5-20 mg, 5-20 mg, Intravenous, Q2H PRN, Geronimo Amel, MD   midazolam  (VERSED ) injection 1-4 mg, 1-4 mg, Intravenous, Q2H PRN, Geronimo Amel, MD, 2 mg at 03/06/24 9077   polyethylene glycol (MIRALAX  / GLYCOLAX ) packet 17 g, 17 g, Per Tube, Daily, Idella Lamontagne, MD   propofol  (DIPRIVAN ) 1000 MG/100ML infusion, 0-80 mcg/kg/min, Intravenous, Continuous, Tiera Mensinger, MD, Last Rate: 6.75 mL/hr at 03/06/24 0930, 15 mcg/kg/min at 03/06/24 0930   sodium zirconium cyclosilicate  (LOKELMA ) packet 10 g, 10 g, Per Tube, Once, Geronimo Amel, MD  Current Outpatient Medications:    allopurinol  (ZYLOPRIM ) 100 MG tablet, Take 1 tablet (100 mg total) by mouth daily., Disp: 30 tablet, Rfl: 0   amLODipine  (NORVASC ) 10 MG tablet, Take 10 mg by mouth at bedtime., Disp: , Rfl:    furosemide  (LASIX ) 80 MG tablet, Take 1 tablet (80 mg total) by mouth daily. (Patient taking differently: Take 80 mg by mouth 3 (three) times daily.), Disp: , Rfl:    irbesartan  (AVAPRO ) 300 MG tablet, Take 1 tablet (300 mg total) by  mouth daily., Disp: 90 tablet, Rfl: 3   lidocaine -prilocaine  (EMLA ) cream, Apply 1 Application topically once., Disp: , Rfl:    medroxyPROGESTERone  (DEPO-PROVERA ) 150 MG/ML injection, Inject 1 mL (150 mg total) into the muscle every 3 (three) months., Disp: 1 mL, Rfl: 0   nitroGLYCERIN  (NITROSTAT ) 0.4 MG SL tablet, Place 1 tablet (0.4 mg total) under the tongue every 5 (five) minutes as needed for chest pain., Disp: 30 tablet, Rfl: 12   ondansetron  (ZOFRAN -ODT) 4 MG disintegrating tablet, Take 1 tablet (4 mg total) by mouth every 8 (eight) hours as needed for nausea or vomiting., Disp: 20 tablet, Rfl: 0   oxyCODONE  (OXY IR/ROXICODONE ) 5 MG immediate release tablet, Take 1 tablet (5 mg total) by mouth every 4 (four) hours as needed for moderate pain (pain score 4-6) or breakthrough pain., Disp: 20 tablet, Rfl: 0   sevelamer  carbonate (RENVELA ) 2.4 g PACK, Take 2.4 g by mouth 2 (two) times daily with a meal., Disp: , Rfl:    tiZANidine  (ZANAFLEX ) 4 MG tablet, Take 4 mg by mouth every 8 (eight) hours as needed for muscle spasms., Disp: , Rfl:      Significant Hospital Events:  03/06/2024 - admit   Interim History / Subjective:   03/06/2024 - seen in seen in ER 26 at [facility].  Extremely diaphoretic, sitting and in respiratory distress with paradoxical respiration.  Tachycardic heart rate 140s, systolic blood pressure 190 and with significant bilateral crackles.  But alert and oriented x 3.  Objective   Blood pressure (!) 188/141, pulse (!) 145, temperature 98.4 F (36.9 C), temperature source Oral, resp. rate (!) 41, height 5' 6 (1.676 m), weight 75 kg, SpO2 100%.    Vent Mode: PRVC FiO2 (%):  [100 %] 100 % Set Rate:  [20 bmp] 20 bmp Vt Set:  [470 mL] 470 mL PEEP:  [5 cmH20] 5 cmH20 Plateau Pressure:  [27 cmH20] 27 cmH20  No intake or output data in the 24 hours ending 03/06/24 0938 Filed Weights   03/06/24 0405  Weight: 75 kg    Examination: General: looks in distress,  Sitting and gaspoing for air HENT: DIaphoretic, ACC neck muscles of breahing + Lungs: CRACKL:Es, RR 40 and paradoxical Cardiovascular: Tacy and Hypertensive. No murmurs Abdomen: Soft Extremities: intact Neuro: AxXO3. Moves all4s GU: not examinex  Resolved Hospital Problem list   x  Assessment & Plan:  PULMONARY  A:  Acute hypoxemic respiratory failure secondary to volume overload with pulmonary congestion secondary to missed hemodialysis sessions [recurrent episodes x 4 at least in 2025] - Per grandmother most recently compliant with HD  03/06/2024 -> severe respiratory distress requiring intubation in the ER  P:   PRVC VAP bundle Rule out VAP Check UDS     NEUROLOGIC A:   Chronic pain on oxycodone  at home Sedation needs on the ventilator upon admission 03/06/2024   P:   Propofol  infusion Fentanyl  as needed Versed  as needed RASS sedation score 0 to -2       Hypertension at baseline on amlodipine  and irbesartan  Severe hypertension at presentation with volume overload and pulmonary edema  - 03/06/2024: SBP 190s on nitrolycerin gtt  P:  Systolic blood pressure goal 150-170 Aim for propofol  to control this Aim for hemodialysis to control this with volume removal Labetalol  as needed Next option would be nicardipine  drip [stop nitroglycerin ]  : Chronic systolic dysfunction at least since January 2023 echo [EF 45%; in 2019 EF 50-55%] -most recent echo February 2025 Small pericardial effusion reported February 2025  - 03/06/2024 chest x-ray concerning for large pericardial effusion  P: Urgent echo  : Sinus tachycardia on EKG at admit but at risk for atrial fibrillation and ventricular arrhythmias    P: Telemetry monitoring     At high risk for hospital-acquired pneumonia At high risk for infectious causes of diarrhea present at admission   P:   Blood culture Tracheal aspirate Stool GI panel Check RVP iand COVID PCR Check MRSA PCR Check urine  strep Check urine legionella Check URine Tox  Empriic Vanc - low threshold to dc Empiric Zosyn  - low threshold to dc   End-stage renal disease on hemodialysis  History of noncompliance and multiple admissions due to volume overload    P:  Urgent hemodialysis in the ICU for renal     Mild hyperkalemia at admission due to missed dialysis  P: Lokelma      Abdominal pain vomiting and diarrhea at admission history 03/06/2024 details not known.  CT abdomen noncontributory   P:   Await stool GI panel PCR    Anemia of chronic disease and renal disease with hemoglobin baseline 9-10 g%   P:  - PRBC for hgb </= 6.9gm%    - exceptions are   -  if ACS susepcted/confirmed then transfuse for hgb </= 8.0gm%,  or    -  active bleeding with hemodynamic instability, then transfuse regardless of hemoglobin value   At at all times try to transfuse 1 unit prbc as possible with exception of active hemorrhage      At risk for hypo and hyperglycemia    P:   Insulin  protocol  MSK/DERM Systemic lupus erythematosus with discoid skin lesions with a history of thalidomide in the past for the skin lesions -currently not on any immunosuppression's  Gluteal skin lesion status post punch biopsy June 2025 with fat tissue [unlikely calciphylaxis] - Grandmother most concrned    Best practice (daily eval):  Diet: N.p.o. but start tube feeds Pain/Anxiety/Delirium protocol (if indicated): PAD protocol VAP protocol (if indicated): Yes subcu DVT prophylaxis: Subcutaneous heparin  GI prophylaxis: Protonix  Glucose control: SSI Mobility: Bedrest in the ICU Code Status: Full code Disposition: Moved from the ER to ICU    Family Updates:  7/7 - called Gradmother Francise Sweda [phone] -> msot concerned about things coming up in body: Informed her about all above issues     ATTESTATION & SIGNATURE   The patient Adhya Cocco is critically ill with multiple organ systems  failure and requires high complexity decision making for assessment and support, frequent evaluation and titration of therapies, application of advanced monitoring technologies and extensive interpretation of multiple databases.   Critical Care Time devoted to patient care services described in this note is  60  Minutes. This time reflects time of care of this signee Dr Dorethia Cave. This critical care time does not reflect procedure time, or teaching time or supervisory time of PA/NP/Med student/Med Resident etc but could involve care discussion time      SIGNATURE    Dr. Dorethia Cave, M.D., F.C.C.P,  Pulmonary and Critical Care Medicine Staff Physician, [facility] Director - Interstitial Lung Disease  Program  Pulmonary Fibrosis [facility] [place], [place], [address]  NPI Number:  NPI #[id number]  Pager: [phone], If no answer  -> Check AMION or Try [phone] Telephone (clinical office): [phone] Telephone (research): [phone]  9:38 AM 03/06/2024   03/06/2024 9:38 AM    LABS    PULMONARY Recent Labs  Lab 03/06/24 0845  HCO3 20.7  TCO2 22  O2SAT 88    CBC Recent Labs  Lab 03/06/24 0417 03/06/24 0845  HGB 9.3* 12.2  HCT 31.4* 36.0  WBC 7.5  --   PLT 206  --     COAGULATION No results for input(s): INR in the last 168 hours.  CARDIAC  No results for input(s): TROPONINI in the last 168 hours. No results for input(s): PROBNP in the last 168 hours.  CHEMISTRY Recent Labs  Lab 03/06/24 0417 03/06/24 0845  NA 137 136  K 5.2* 5.2*  CL 99  --   CO2 22  --   GLUCOSE 79  --   BUN 49*  --   CREATININE 9.36*  --   CALCIUM  9.4  --    Estimated Creatinine Clearance: 9.3 mL/min (A) (by C-G formula based on SCr of 9.36 mg/dL (H)).   LIVER Recent Labs  Lab 03/06/24 0417  AST 17  ALT 11  ALKPHOS 85  BILITOT 0.6  PROT 7.5  ALBUMIN  3.1*     INFECTIOUS No results for  input(s): LATICACIDVEN, PROCALCITON in the last 168 hours.   ENDOCRINE CBG (last 3)  No results for input(s): GLUCAP in  the last 72 hours.       IMAGING x48h  - image(s) personally visualized  -   highlighted in bold DG Chest Port 1 View Result Date: 03/06/2024 CLINICAL DATA:  Severe dyspnea. EXAM: PORTABLE CHEST 1 VIEW COMPARISON:  02/08/2024 FINDINGS: The cardio pericardial silhouette is enlarged. Vascular congestion with diffuse interstitial and hazy airspace disease suggests edema. Probable trace effusions. Telemetry leads overlie the chest. IMPRESSION: Enlargement of the cardiopericardial silhouette with vascular congestion and pulmonary edema. Electronically Signed   By: Camellia Candle M.D.   On: 03/06/2024 08:20   CT Renal Stone Study Result Date: 03/06/2024 CLINICAL DATA:  Abdominal and flank pain, stone suspected EXAM: CT ABDOMEN AND PELVIS WITHOUT CONTRAST TECHNIQUE: Multidetector CT imaging of the abdomen and pelvis was performed following the standard protocol without IV contrast. RADIATION DOSE REDUCTION: This exam was performed according to the departmental dose-optimization program which includes automated exposure control, adjustment of the mA and/or kV according to patient size and/or use of iterative reconstruction technique. COMPARISON:  02/08/2024 FINDINGS: Lower chest: Cardiomegaly. Small pericardial effusion, similar to prior. Trace pleural effusions and interlobular septal thickening throughout the lung bases. Hepatobiliary: No solid liver abnormality is seen. Hepatomegaly, maximum coronal span 23.8 cm. No gallstones, gallbladder wall thickening, or biliary dilatation. Pancreas: Unremarkable. No pancreatic ductal dilatation or surrounding inflammatory changes. Spleen: Normal in size without significant abnormality. Adrenals/Urinary Tract: Adrenal glands are unremarkable. Mildly atrophic kidneys. No calculi or hydronephrosis. Bladder is unremarkable. Stomach/Bowel: Stomach is  within normal limits. Appendix appears normal. No evidence of bowel wall thickening, distention, or inflammatory changes. Vascular/Lymphatic: Scattered infrarenal abdominal aortic atherosclerosis. Enlarged retroperitoneal lymph nodes with scattered calcifications. Reproductive: No mass or other significant abnormality. Other: No abdominal wall hernia. Anasarca. Dystrophic soft tissue calcifications throughout the buttocks. No ascites. Musculoskeletal: No acute or significant osseous findings. Renal osteodystrophy. IMPRESSION: 1. No acute noncontrast CT findings of the abdomen or pelvis to explain abdominal pain. Atrophic kidneys. No urinary tract calculi or hydronephrosis. 2. Cardiomegaly with small pericardial effusion and bibasilar pulmonary edema. 3. Hepatomegaly. 4. Enlarged retroperitoneal lymph nodes with scattered calcifications, in keeping with history of lupus. 5. Renal osteodystrophy. 6. Anasarca. Aortic Atherosclerosis (ICD10-I70.0). Electronically Signed   By: Marolyn JONETTA Jaksch M.D.   On: 03/06/2024 06:39

## 2024-03-06 NOTE — ED Notes (Signed)
 Icu md, plan to intubate pt

## 2024-03-06 NOTE — ED Notes (Signed)
Rt at bedside putting pt on bipap

## 2024-03-06 NOTE — Progress Notes (Addendum)
 Hypoglycemic Event  CBG: 51  Treatment: D50 50 mL (25 gm)  Symptoms: None  1 Amp of Dextrose  Given  MD/Elink Notified for consideration of IV dextrose  to (x)amps of dextrose  today.   2026: Repeat CBG: 40  2027: 1 Amp of Dextrose  Given  2030: D5 started.  2126: Repeat CBG 90  2319: NT CBG reading on hand 39. HD nurse at bedside and able to draw blood from line that read 67 on glucometer by this RN. E-link notified. 2325: D5 changed to D10  0033: CBG still reading 60s via HD line. 1/2 Amp given. E-Link notified. 0038: Orders to titrate up on D10   0106: CBG 75

## 2024-03-06 NOTE — ED Provider Notes (Signed)
 [place] EMERGENCY DEPARTMENT AT [facility] Provider Note   CSN: [id number] Arrival date & time: 03/06/24  0404     Patient presents with: Abdominal Pain   Linda [name] is a 28 y.o. female.   The history is provided by the patient.  Abdominal Pain Pain location:  Generalized Pain quality: bloating   Pain radiates to:  Does not radiate Pain severity:  Moderate Onset quality:  Gradual Timing:  Constant Progression:  Unchanged Chronicity:  New Context: not trauma   Relieved by:  Nothing Worsened by:  Nothing Ineffective treatments:  None tried Associated symptoms: diarrhea, nausea and vomiting   Associated symptoms: no chest pain and no fever   Risk factors: not elderly   Patient with ESRD with intractable nausea vomiting and diarrhea      Prior to Admission medications   Medication Sig Start Date End Date Taking? Authorizing Provider  allopurinol  (ZYLOPRIM ) 100 MG tablet Take 1 tablet (100 mg total) by mouth daily. 02/13/24   Elgergawy, Brayton RAMAN, MD  amLODipine  (NORVASC ) 10 MG tablet Take 10 mg by mouth at bedtime. 08/05/21   [provider]  furosemide  (LASIX ) 80 MG tablet Take 1 tablet (80 mg total) by mouth daily. Patient taking differently: Take 80 mg by mouth 3 (three) times daily. 12/23/23   Fairy Frames, MD  irbesartan  (AVAPRO ) 300 MG tablet Take 1 tablet (300 mg total) by mouth daily. 02/03/24   Walker, Caitlin S, NP  lidocaine -prilocaine  (EMLA ) cream Apply 1 Application topically once. 01/14/24   [provider]  medroxyPROGESTERone  (DEPO-PROVERA ) 150 MG/ML injection Inject 1 mL (150 mg total) into the muscle every 3 (three) months. 10/29/22   Rudy Carlin LABOR, MD  nitroGLYCERIN  (NITROSTAT ) 0.4 MG SL tablet Place 1 tablet (0.4 mg total) under the tongue every 5 (five) minutes as needed for chest pain. 10/28/23   Rai, Nydia POUR, MD  ondansetron  (ZOFRAN -ODT) 4 MG disintegrating tablet Take 1 tablet (4 mg total) by mouth every 8  (eight) hours as needed for nausea or vomiting. 10/28/23   Rai, Ripudeep K, MD  oxyCODONE  (OXY IR/ROXICODONE ) 5 MG immediate release tablet Take 1 tablet (5 mg total) by mouth every 4 (four) hours as needed for moderate pain (pain score 4-6) or breakthrough pain. 02/12/24   Elgergawy, Brayton RAMAN, MD  sevelamer  carbonate (RENVELA ) 2.4 g PACK Take 2.4 g by mouth 2 (two) times daily with a meal. 01/21/24   [provider]  tiZANidine  (ZANAFLEX ) 4 MG tablet Take 4 mg by mouth every 8 (eight) hours as needed for muscle spasms. 12/03/23   [provider]    Allergies: Iodine , Lisinopril , Gabapentin, Ativan  [lorazepam ], Hydroxychloroquine , and Vicodin [hydrocodone -acetaminophen ]    Review of Systems  Constitutional:  Negative for fever.  HENT:  Negative for facial swelling.   Cardiovascular:  Negative for chest pain and leg swelling.  Gastrointestinal:  Positive for abdominal pain, diarrhea, nausea and vomiting.  All other systems reviewed and are negative.   Updated Vital Signs BP (!) 161/107   Pulse 98   Temp 98.4 F (36.9 C) (Oral)   Resp (!) 26   Ht 5' 6 (1.676 m)   Wt 75 kg   SpO2 96%   BMI 26.69 kg/m   Physical Exam Vitals and nursing note reviewed.  Constitutional:      General: She is not in acute distress.    Appearance: Normal appearance. She is well-developed.  HENT:     Head: Normocephalic and atraumatic.  Nose: Nose normal.  Eyes:     Pupils: Pupils are equal, round, and reactive to light.  Cardiovascular:     Rate and Rhythm: Normal rate and regular rhythm.     Pulses: Normal pulses.     Heart sounds: Normal heart sounds.  Pulmonary:     Effort: Pulmonary effort is normal. No respiratory distress.     Breath sounds: Rales present.  Abdominal:     General: Bowel sounds are normal. There is no distension.     Palpations: Abdomen is soft. There is fluid wave.     Tenderness: There is no guarding or rebound. Negative signs include Murphy's sign and  McBurney's sign.  Musculoskeletal:        General: Normal range of motion.     Cervical back: Normal range of motion and neck supple.  Skin:    General: Skin is warm and dry.     Capillary Refill: Capillary refill takes less than 2 seconds.     Findings: No erythema or rash.  Neurological:     General: No focal deficit present.     Mental Status: She is alert and oriented to person, place, and time.     Deep Tendon Reflexes: Reflexes normal.  Psychiatric:        Mood and Affect: Mood normal.     (all labs ordered are listed, but only abnormal results are displayed) Results for orders placed or performed during the hospital encounter of 03/06/24  CBC with Differential   Collection Time: 03/06/24  4:17 AM  Result Value Ref Range   WBC 7.5 4.0 - 10.5 K/uL   RBC 3.33 (L) 3.87 - 5.11 MIL/uL   Hemoglobin 9.3 (L) 12.0 - 15.0 g/dL   HCT 68.5 (L) 63.9 - 53.9 %   MCV 94.3 80.0 - 100.0 fL   MCH 27.9 26.0 - 34.0 pg   MCHC 29.6 (L) 30.0 - 36.0 g/dL   RDW 81.8 (H) 88.4 - 84.4 %   Platelets 206 150 - 400 K/uL   nRBC 0.0 0.0 - 0.2 %   Neutrophils Relative % 53 %   Neutro Abs 4.0 1.7 - 7.7 K/uL   Lymphocytes Relative 30 %   Lymphs Abs 2.3 0.7 - 4.0 K/uL   Monocytes Relative 11 %   Monocytes Absolute 0.8 0.1 - 1.0 K/uL   Eosinophils Relative 5 %   Eosinophils Absolute 0.4 0.0 - 0.5 K/uL   Basophils Relative 0 %   Basophils Absolute 0.0 0.0 - 0.1 K/uL   Immature Granulocytes 1 %   Abs Immature Granulocytes 0.04 0.00 - 0.07 K/uL  Comprehensive metabolic panel   Collection Time: 03/06/24  4:17 AM  Result Value Ref Range   Sodium 137 135 - 145 mmol/L   Potassium 5.2 (H) 3.5 - 5.1 mmol/L   Chloride 99 98 - 111 mmol/L   CO2 22 22 - 32 mmol/L   Glucose, Bld 79 70 - 99 mg/dL   BUN 49 (H) 6 - 20 mg/dL   Creatinine, Ser 0.63 (H) 0.44 - 1.00 mg/dL   Calcium  9.4 8.9 - 10.3 mg/dL   Total Protein 7.5 6.5 - 8.1 g/dL   Albumin  3.1 (L) 3.5 - 5.0 g/dL   AST 17 15 - 41 U/L   ALT 11 0 - 44 U/L    Alkaline Phosphatase 85 38 - 126 U/L   Total Bilirubin 0.6 0.0 - 1.2 mg/dL   GFR, Estimated 5 (L) >60 mL/min   Anion gap 16 (  H) 5 - 15  hCG, serum, qualitative   Collection Time: 03/06/24  4:17 AM  Result Value Ref Range   Preg, Serum NEGATIVE NEGATIVE   US  Abdomen Limited RUQ (LIVER/GB) Addendum Date: 02/08/2024 ADDENDUM REPORT: 02/08/2024 22:01 ADDENDUM: Echogenic right kidney suggesting medical renal disease. No hydronephrosis Electronically Signed   By: Luke Bun M.D.   On: 02/08/2024 22:01   Result Date: 02/08/2024 CLINICAL DATA:  Right upper quadrant pain EXAM: ULTRASOUND ABDOMEN LIMITED RIGHT UPPER QUADRANT COMPARISON:  CT 02/08/2024, ultrasound 11/14/2023 FINDINGS: Gallbladder: No shadowing stones. Trace fluid adjacent to the gallbladder. Negative sonographic Murphy. Wall thickness within normal limits Common bile duct: Diameter: 2.2 mm Liver: No focal lesion identified. Within normal limits in parenchymal echogenicity. Portal vein is patent on color Doppler imaging with normal direction of blood flow towards the liver. Other: None. IMPRESSION: Negative for gallstones or biliary dilatation. Trace fluid adjacent to the gallbladder, nonspecific. No definite sonographic evidence for acute cholecystitis Electronically Signed: By: Luke Bun M.D. On: 02/08/2024 20:48   CT ABDOMEN PELVIS WO CONTRAST Result Date: 02/08/2024 CLINICAL DATA:  Acute nonlocalized abdominal pain EXAM: CT ABDOMEN AND PELVIS WITHOUT CONTRAST TECHNIQUE: Multidetector CT imaging of the abdomen and pelvis was performed following the standard protocol without IV contrast. RADIATION DOSE REDUCTION: This exam was performed according to the departmental dose-optimization program which includes automated exposure control, adjustment of the mA and/or kV according to patient size and/or use of iterative reconstruction technique. COMPARISON:  CT pelvis 01/11/2024 and CT abdomen pelvis 11/14/2023 FINDINGS: Lower chest:  Ground-glass opacities and interlobular septal thickening in the lower lungs compatible with edema. Marked cardiomegaly. Small pericardial effusion. Hepatobiliary: Unremarkable noncontrast appearance of the liver. Pericholecystic fluid is present. No biliary dilation radiopaque stone Pancreas: Unremarkable. Spleen: Unremarkable. Adrenals/Urinary Tract: Normal adrenal glands. Scarring the left kidney. No urinary calculi or hydronephrosis. Unremarkable bladder. Stomach/Bowel: Normal caliber large and small bowel. No bowel wall thickening. Stomach and appendix are within normal limits. Vascular/Lymphatic: Aortic atherosclerosis. No enlarged abdominal or pelvic lymph nodes. Reproductive: Uterus and bilateral adnexa are unremarkable. Other: Small volume low-density free fluid in the pelvis. Mesenteric edema. No free intraperitoneal air. Musculoskeletal: Dystrophic calcifications in gluteal soft tissue. No acute fracture. Chronic L5 spondylolysis without spondylolisthesis. IMPRESSION: 1. Marked cardiomegaly with pulmonary edema, small pericardial effusion, and small volume ascites. 2. Pericholecystic fluid is favored due to third spacing of fluid. If there is concern for acute cholecystitis, consider further evaluation with right upper quadrant ultrasound. 3. Aortic Atherosclerosis (ICD10-I70.0). Electronically Signed   By: Norman Gatlin M.D.   On: 02/08/2024 19:14   DG Chest Port 1 View Result Date: 02/08/2024 CLINICAL DATA:  Fatigue, dizziness. EXAM: PORTABLE CHEST 1 VIEW COMPARISON:  Jan 28, 2024. FINDINGS: Stable cardiomegaly. Both lungs are clear. The visualized skeletal structures are unremarkable. IMPRESSION: No active disease. Electronically Signed   By: Lynwood Landy Raddle M.D.   On: 02/08/2024 15:09     EKG: EKG Interpretation Date/Time:  Monday March 06 2024 04:37:04 EDT Ventricular Rate:  97 PR Interval:  206 QRS Duration:  86 QT Interval:  394 QTC Calculation: 501 R Axis:   60  Text  Interpretation: Sinus rhythm Prolonged PR interval Right atrial enlargement LVH by voltage Confirmed by Nettie, Gelene Recktenwald ([id number]) on 03/06/2024 5:20:26 AM  Radiology: No results found.   Procedures   Medications Ordered in the ED  droperidol  (INAPSINE ) 2.5 MG/ML injection 1.25 mg (1.25 mg Intravenous Given 03/06/24 0558)  sodium zirconium cyclosilicate  (LOKELMA ) packet 10 g (  10 g Oral Given 03/06/24 0533)                                    Medical Decision Making Patient with intractable vomiting and diarrhea   Amount and/or Complexity of Data Reviewed Independent Historian: EMS    Details: See above  External Data Reviewed: notes.    Details: Previous notes reviewed  Labs: ordered.    Details: Normal sodium 137, potassium slight elevation 5.2, elevated creatinine 9.36. normal LFTs.  Normal white count 7.5, low hemoglobin 9.3, normal platelets   Radiology: ordered and independent interpretation performed.    Details: Pulmonary edema by me on CT  Risk Prescription drug management. Decision regarding hospitalization.     Final diagnoses:  None  The patient appears reasonably stabilized for admission considering the current resources, flow, and capabilities available in the ED at this time, and I doubt any other [facility] requiring further screening and/or treatment in the ED prior to admission.   ED Discharge Orders     None          Dhiya Smits, MD 03/06/24 [phone]

## 2024-03-06 NOTE — ED Notes (Signed)
 Holding ntg gtt per icu md instructions

## 2024-03-06 NOTE — Progress Notes (Signed)
*  PRELIMINARY RESULTS* Echocardiogram 2D Echocardiogram has been performed.  Linda [name] Stallion 03/06/2024, 3:10 PM

## 2024-03-06 NOTE — ED Notes (Signed)
 Md Ray at bedside, pt is tachy and has elevated RR, pt satting 96 on NRB, lungs are diminished and crackles throughout, RT called for bipap

## 2024-03-06 NOTE — Progress Notes (Signed)
 Pharmacy Antibiotic Note  Linda [name] is a 28 y.o. female admitted on 03/06/2024 with concern for pneumonia. Initially presented for Shob w/ trial of high dose nitroglycerin  and ultimately required intubation in ED. PMH significant for ESRD on MWF (next planned for today). Pharmacy has been consulted for vancomycin  dosing.  Plan: Vancomycin  1500mg  x 1 load  Will doses per levels vs schedule a regimen once HD scheduled F/u renal recommendations, MRSA pcr, and narrow as able  Height: 5' 6 (167.6 cm) Weight: 75 kg (165 lb 5.5 oz) IBW/kg (Calculated) : 59.3  Temp (24hrs), Avg:98.5 F (36.9 C), Min:98.4 F (36.9 C), Max:98.5 F (36.9 C)  Recent Labs  Lab 03/06/24 0417  WBC 7.5  CREATININE 9.36*    Estimated Creatinine Clearance: 9.3 mL/min (A) (by C-G formula based on SCr of 9.36 mg/dL (H)).    Allergies  Allergen Reactions   Iodine  Hives and Shortness Of Breath   Lisinopril  Anaphylaxis    Angioedema   Gabapentin Other (See Comments)    Reaction: Tremor (intolerance); tremor   Ativan  [Lorazepam ] Other (See Comments)    Somnolent with 1mg  ativan  at [facility] Nov 2019. Patient stated it made her feel like she shut down and was mute and non-responding.   Hydroxychloroquine  Other (See Comments)    Pt reports making her skin peel really bad   Vicodin [Hydrocodone -Acetaminophen ] Itching and Rash    Antimicrobials this admission: Zosyn  7/7 > Vancomycin  7/7 >  Thank you for allowing pharmacy to be a part of this patient's care.  Leonor GORMAN Bash 03/06/2024 10:26 AM

## 2024-03-07 ENCOUNTER — Inpatient Hospital Stay (HOSPITAL_COMMUNITY)

## 2024-03-07 ENCOUNTER — Encounter (HOSPITAL_COMMUNITY): Payer: Self-pay | Admitting: Internal Medicine

## 2024-03-07 DIAGNOSIS — J810 Acute pulmonary edema: Secondary | ICD-10-CM

## 2024-03-07 DIAGNOSIS — M329 Systemic lupus erythematosus, unspecified: Secondary | ICD-10-CM

## 2024-03-07 DIAGNOSIS — N186 End stage renal disease: Secondary | ICD-10-CM | POA: Diagnosis not present

## 2024-03-07 DIAGNOSIS — J9601 Acute respiratory failure with hypoxia: Secondary | ICD-10-CM | POA: Diagnosis not present

## 2024-03-07 DIAGNOSIS — I5020 Unspecified systolic (congestive) heart failure: Secondary | ICD-10-CM

## 2024-03-07 DIAGNOSIS — J690 Pneumonitis due to inhalation of food and vomit: Secondary | ICD-10-CM

## 2024-03-07 DIAGNOSIS — E8721 Acute metabolic acidosis: Secondary | ICD-10-CM

## 2024-03-07 DIAGNOSIS — Z992 Dependence on renal dialysis: Secondary | ICD-10-CM | POA: Diagnosis not present

## 2024-03-07 HISTORY — DX: Pneumonitis due to inhalation of food and vomit: J69.0

## 2024-03-07 LAB — COMPREHENSIVE METABOLIC PANEL WITH GFR
ALT: 10 U/L (ref 0–44)
AST: 21 U/L (ref 15–41)
Albumin: 2.9 g/dL — ABNORMAL LOW (ref 3.5–5.0)
Alkaline Phosphatase: 66 U/L (ref 38–126)
Anion gap: 15 (ref 5–15)
BUN: 27 mg/dL — ABNORMAL HIGH (ref 6–20)
CO2: 24 mmol/L (ref 22–32)
Calcium: 8.9 mg/dL (ref 8.9–10.3)
Chloride: 89 mmol/L — ABNORMAL LOW (ref 98–111)
Creatinine, Ser: 5.59 mg/dL — ABNORMAL HIGH (ref 0.44–1.00)
GFR, Estimated: 10 mL/min — ABNORMAL LOW (ref >60)
Glucose, Bld: 53 mg/dL — ABNORMAL LOW (ref 70–99)
Potassium: 4.5 mmol/L (ref 3.5–5.1)
Sodium: 128 mmol/L — ABNORMAL LOW (ref 135–145)
Total Bilirubin: 1.8 mg/dL — ABNORMAL HIGH (ref 0.0–1.2)
Total Protein: 7.3 g/dL (ref 6.5–8.1)

## 2024-03-07 LAB — GLUCOSE, CAPILLARY
Glucose-Capillary: 59 mg/dL — ABNORMAL LOW (ref 70–99)
Glucose-Capillary: 63 mg/dL — ABNORMAL LOW (ref 70–99)
Glucose-Capillary: 67 mg/dL — ABNORMAL LOW (ref 70–99)
Glucose-Capillary: 67 mg/dL — ABNORMAL LOW (ref 70–99)
Glucose-Capillary: 68 mg/dL — ABNORMAL LOW (ref 70–99)
Glucose-Capillary: 74 mg/dL (ref 70–99)
Glucose-Capillary: 75 mg/dL (ref 70–99)
Glucose-Capillary: 79 mg/dL (ref 70–99)
Glucose-Capillary: 90 mg/dL (ref 70–99)

## 2024-03-07 LAB — PROCALCITONIN: Procalcitonin: 19.41 ng/mL

## 2024-03-07 LAB — CBC
HCT: 31.4 % — ABNORMAL LOW (ref 36.0–46.0)
Hemoglobin: 9.9 g/dL — ABNORMAL LOW (ref 12.0–15.0)
MCH: 28.4 pg (ref 26.0–34.0)
MCHC: 31.5 g/dL (ref 30.0–36.0)
MCV: 90 fL (ref 80.0–100.0)
Platelets: 232 K/uL (ref 150–400)
RBC: 3.49 MIL/uL — ABNORMAL LOW (ref 3.87–5.11)
RDW: 18 % — ABNORMAL HIGH (ref 11.5–15.5)
WBC: 8.3 K/uL (ref 4.0–10.5)
nRBC: 0 % (ref 0.0–0.2)

## 2024-03-07 LAB — LACTIC ACID, PLASMA: Lactic Acid, Venous: 1.2 mmol/L (ref 0.5–1.9)

## 2024-03-07 LAB — BASIC METABOLIC PANEL WITH GFR
Anion gap: 18 — ABNORMAL HIGH (ref 5–15)
BUN: 29 mg/dL — ABNORMAL HIGH (ref 6–20)
CO2: 25 mmol/L (ref 22–32)
Calcium: 9 mg/dL (ref 8.9–10.3)
Chloride: 92 mmol/L — ABNORMAL LOW (ref 98–111)
Creatinine, Ser: 6.5 mg/dL — ABNORMAL HIGH (ref 0.44–1.00)
GFR, Estimated: 8 mL/min — ABNORMAL LOW (ref >60)
Glucose, Bld: 89 mg/dL (ref 70–99)
Potassium: 4.7 mmol/L (ref 3.5–5.1)
Sodium: 135 mmol/L (ref 135–145)

## 2024-03-07 LAB — TROPONIN I (HIGH SENSITIVITY)
Troponin I (High Sensitivity): 81 ng/L — ABNORMAL HIGH (ref <18)
Troponin I (High Sensitivity): 73 ng/L — ABNORMAL HIGH (ref <18)

## 2024-03-07 LAB — LIPASE, BLOOD: Lipase: 26 U/L (ref 11–51)

## 2024-03-07 LAB — HEPATITIS B SURFACE ANTIBODY, QUANTITATIVE: Hep B S AB Quant (Post): <3.5 m[IU]/mL — ABNORMAL LOW

## 2024-03-07 LAB — TRIGLYCERIDES: Triglycerides: 1332 mg/dL — ABNORMAL HIGH (ref <150)

## 2024-03-07 MED ORDER — ANTICOAGULANT SODIUM CITRATE 4% (200MG/5ML) IV SOLN: 5.0000 mL | Status: DC | PRN

## 2024-03-07 MED ORDER — ACETAMINOPHEN 10 MG/ML IV SOLN
1000.0000 mg | Freq: Once | INTRAVENOUS | Status: AC
  Administered 2024-03-07: 1000 mg via INTRAVENOUS
  Filled 2024-03-07: qty 100

## 2024-03-07 MED ORDER — DEXTROSE 50 % IV SOLN
INTRAVENOUS | Status: AC
  Administered 2024-03-07: 50 mL
  Filled 2024-03-07: qty 50

## 2024-03-07 MED ORDER — POLYETHYLENE GLYCOL 3350 17 G PO PACK: 17.0000 g | PACK | Freq: Every day | ORAL | Status: DC | PRN

## 2024-03-07 MED ORDER — HYDRALAZINE HCL 20 MG/ML IJ SOLN: 10.0000 mg | INTRAMUSCULAR | Status: DC | PRN

## 2024-03-07 MED ORDER — DEXTROSE 10 % IV SOLN: INTRAVENOUS | Status: AC

## 2024-03-07 MED ORDER — METOPROLOL TARTRATE 5 MG/5ML IV SOLN: 5.0000 mg | Freq: Four times a day (QID) | INTRAVENOUS | Status: DC

## 2024-03-07 MED ORDER — ORAL CARE MOUTH RINSE: 15.0000 mL | OROMUCOSAL | Status: DC | PRN

## 2024-03-07 MED ORDER — DOCUSATE SODIUM 50 MG/5ML PO LIQD: 100.0000 mg | Freq: Two times a day (BID) | ORAL | Status: DC

## 2024-03-07 MED ORDER — OXYCODONE HCL 5 MG PO TABS
5.0000 mg | ORAL_TABLET | Freq: Four times a day (QID) | ORAL | Status: DC | PRN
  Administered 2024-03-07: 5 mg via ORAL
  Filled 2024-03-07: qty 1

## 2024-03-07 MED ORDER — ORAL CARE MOUTH RINSE
15.0000 mL | Freq: Two times a day (BID) | OROMUCOSAL | Status: DC
  Administered 2024-03-08 – 2024-03-09 (×3): 15 mL via OROMUCOSAL

## 2024-03-07 MED ORDER — HEPARIN SODIUM (PORCINE) 1000 UNIT/ML DIALYSIS
1000.0000 [IU] | INTRAMUSCULAR | Status: DC | PRN
  Administered 2024-03-07: 3000 [IU]

## 2024-03-07 MED ORDER — HEPARIN SODIUM (PORCINE) 5000 UNIT/ML IJ SOLN
5000.0000 [IU] | Freq: Three times a day (TID) | INTRAMUSCULAR | Status: DC
  Administered 2024-03-07 – 2024-03-11 (×9): 5000 [IU] via SUBCUTANEOUS
  Filled 2024-03-07 (×12): qty 1

## 2024-03-07 MED ORDER — POLYETHYLENE GLYCOL 3350 17 G PO PACK: 17.0000 g | PACK | Freq: Every day | ORAL | Status: DC

## 2024-03-07 MED ORDER — HEPARIN SODIUM (PORCINE) 1000 UNIT/ML IJ SOLN
INTRAMUSCULAR | Status: AC
  Filled 2024-03-07: qty 3

## 2024-03-07 MED ORDER — PENTAFLUOROPROP-TETRAFLUOROETH EX AERO: 1.0000 | INHALATION_SPRAY | CUTANEOUS | Status: DC | PRN

## 2024-03-07 MED ORDER — DEXMEDETOMIDINE HCL IN NACL 400 MCG/100ML IV SOLN
0.0000 ug/kg/h | INTRAVENOUS | Status: DC
  Administered 2024-03-07: 0.4 ug/kg/h via INTRAVENOUS
  Filled 2024-03-07: qty 100

## 2024-03-07 MED ORDER — CHLORHEXIDINE GLUCONATE CLOTH 2 % EX PADS
6.0000 | MEDICATED_PAD | Freq: Every day | CUTANEOUS | Status: DC
  Administered 2024-03-10: 6 via TOPICAL

## 2024-03-07 MED ORDER — DOCUSATE SODIUM 100 MG PO CAPS: 100.0000 mg | ORAL_CAPSULE | Freq: Two times a day (BID) | ORAL | Status: DC | PRN

## 2024-03-07 MED ORDER — LIDOCAINE-PRILOCAINE 2.5-2.5 % EX CREA: 1.0000 | TOPICAL_CREAM | CUTANEOUS | Status: DC | PRN

## 2024-03-07 MED ORDER — ALTEPLASE 2 MG IJ SOLR
2.0000 mg | Freq: Once | INTRAMUSCULAR | Status: DC | PRN
Start: 2024-03-07 — End: 2024-03-07

## 2024-03-07 MED ORDER — ACETAMINOPHEN 10 MG/ML IV SOLN
1000.0000 mg | Freq: Four times a day (QID) | INTRAVENOUS | Status: DC
  Administered 2024-03-07 – 2024-03-08 (×2): 1000 mg via INTRAVENOUS
  Filled 2024-03-07 (×2): qty 100

## 2024-03-07 MED ORDER — DEXTROSE 50 % IV SOLN
INTRAVENOUS | Status: AC
  Administered 2024-03-07: 25 mL
  Filled 2024-03-07: qty 50

## 2024-03-07 MED ORDER — OXYCODONE HCL 5 MG PO TABS
5.0000 mg | ORAL_TABLET | Freq: Once | ORAL | Status: AC
  Administered 2024-03-08: 5 mg via ORAL
  Filled 2024-03-07: qty 1

## 2024-03-07 MED ORDER — MENTHOL 3 MG MT LOZG: 1.0000 | LOZENGE | OROMUCOSAL | Status: DC | PRN

## 2024-03-07 MED ORDER — LIDOCAINE HCL (PF) 1 % IJ SOLN: 5.0000 mL | INTRAMUSCULAR | Status: DC | PRN

## 2024-03-07 NOTE — Progress Notes (Signed)
   03/07/24 0110  Vitals  BP (!) 149/109  MAP (mmHg) 122  BP Location Right Arm  BP Method Automatic  Patient Position (if appropriate) Lying  Pulse Rate 100  Pulse Rate Source Monitor  ECG Heart Rate 95  Resp (!) 22  Oxygen Therapy  SpO2 100 %  O2 Device Ventilator  During Treatment Monitoring  Blood Flow Rate (mL/min) 0 mL/min  Arterial Pressure (mmHg) -25.65 mmHg  Venous Pressure (mmHg) 106.26 mmHg  TMP (mmHg) 23.03 mmHg  Ultrafiltration Rate (mL/min) 1378 mL/min  Dialysate Flow Rate (mL/min) 299 ml/min  Dialysate Potassium Concentration 2  Dialysate Calcium  Concentration 2.5  Duration of HD Treatment -hour(s) 3.25 hour(s)  Cumulative Fluid Removed (mL) per Treatment  3200.2  HD Safety Checks Performed Yes  Intra-Hemodialysis Comments Tx completed  Dialysis Fluid Bolus Normal Saline  Bolus Amount (mL) 300 mL  Post Treatment  Dialyzer Clearance Lightly streaked  Liters Processed 68.2  Fluid Removed (mL) 3200 mL  Tolerated HD Treatment Yes  AVG/AVF Arterial Site Held (minutes) 10 minutes  AVG/AVF Venous Site Held (minutes) 8 minutes

## 2024-03-07 NOTE — Progress Notes (Signed)
   03/07/24 1845  Therapy Vitals  Resp 16  MEWS Score/Color  MEWS Score 1  MEWS Score Color Green  Oxygen Therapy/Pulse Ox  O2 Device (S)  Nasal Cannula  O2 Therapy Oxygen  O2 Flow Rate (L/min) 4 L/min  SpO2 100 %      Pt taken off BIPAP and placed on 4l . Pt is tolerating well, VSS and pt states breathing is so much better. RT will monitor.

## 2024-03-07 NOTE — Consult Note (Addendum)
 Cardiology Consultation   Patient ID: Linda [name] MRN: [id number]; DOB: [birth year]  Admit date: 03/06/2024 Date of Consult: 03/07/2024  PCP:  [name], NP   [facility] Providers Cardiologist:  None  Cardiology APP:  Vannie Reche RAMAN, NP    Patient Profile: Linda [name] is a 28 y.o. female with a hx of ESRD on HD (M/W/F), lupus nephritis, chronic HFmrEF now with EF 30-35%, hypertension, anemia of chronic disease, who is being seen 03/07/2024 for the evaluation of acute on chronic HFrEF at the request of Dr. Isadora.  History of Present Illness: Linda [name] has past medical history as stated above. She presented to [facility] ED via EMS on 03/06/2024 complaining of abdominal pain with intractable N/V/D for a few days. At some point while she was down in the ED, she went to the bathroom and collapsed. She reported feeling short of breath and was placed on non-rebreather, then BiPAP. Per chart review, the BiPAP was having to be physically held on her face as she was refusing to keep it on. Her SBP was running in the 180s with HR in the 140s with SpO2 in the 50s. CXR showed large volume overload. She was then subsequently intubated in the ED.  Patient has been closely followed by nephrology who re-started HD while inpatient. She underwent HD overnight and got 3.2 L off. Since being extubated on 7/8 she was placed on BiPAP as a precaution. She is to undergo a second HD session today 7/8 as she is still volume overloaded.  She was last seen by Reche Vannie, NP on 02/03/2024 to establish care in advanced HTN clinic. At this visit the patient reported that her breathing was poor and she required multiple hospitalizations for fluid retention. She overall thinks she was improved since most recent discharge. She reports feeling tired consistently but attributes this to poor sleep due to racing thoughts. Her medications in an attempt to control her HTN included: nebivolol   10 mg daily, irbesartan  300 mg daily, Lasix  80 mg daily, amlodipine  10 mg daily along with continuing dialysis. Her BP at that visit was 160/120.   After speaking with the patient, the interview is limited as she is currently on BiPAP and struggles to answer much outside of yes/no questions. She agrees that she feels improved from when she arrived but still well above her baseline. She becomes tearful when she admits that she has pain all over, this seems to be a chronic issue. She typically takes oxycodone  5 mg PRN every 4 hours. She tells me that her throat is hurting after being extubated this morning. I ordered some Ceptacol lozenges for her to use PRN. Overall she seems tearful but stable. Of note, she also tells me that since seeing Reche Vannie in June she has seen improvement in her BP readings, hard for her to tell me any more details while on BiPAP. She complains of pain all over, primary MD was contacted and ordered IV Tylenol , if she requires further escalation of pain management will defer to primary.  Past Medical History:  Diagnosis Date   Abnormal ECG    a. In setting of LVH w/ repolarization abnormalities; b. 03/2019 MV: No ischemia/infarct. EF 55-65%.   Angioedema    ESRD on hemodialysis (HCC)    GERD (gastroesophageal reflux disease)    Hypertension    Lupus    LVH (left ventricular hypertrophy)    a. 11/2017 Echo: EF 50-55%. triv AI. Mild MR. Mod L  and mild R atrial enlargement. Nl RV size/fxn. Small pericardial effusion w/o tampondae; b. 03/2018 Echo: EF 55-60%, restrictive filling pattern. Nl RV size/fxn. Sev LAE. Mild MR, mild to mod TR/PR. Mod PAH. Triv effusion.   Migraines    Mixed connective tissue disease (HCC)    Previous Dialysis patient [facility])    a. Off HD since late 2020.   Septic prepatellar bursitis of right knee 08/19/2017   Past Surgical History:  Procedure Laterality Date   GRAFT APPLICATION Left 03/30/2019   Procedure: BRACHIAL CEPHALIC GRAFT CREATION;   Surgeon: Marea Selinda RAMAN, MD;  Location: [facility] ORS;  Service: Vascular;  Laterality: Left;   I & D EXTREMITY Right 08/06/2017   Procedure: IRRIGATION AND DEBRIDEMENT KNEE;  Surgeon: Kendal Franky SQUIBB, MD;  Location: [facility] OR;  Service: Orthopedics;  Laterality: Right;   IR THROMBECTOMY AV FISTULA W/THROMBOLYSIS/PTA/STENT INC/SHUNT/IMG LT Left 05/18/2022   IR US  GUIDE VASC ACCESS LEFT  05/18/2022   MULTIPLE TOOTH EXTRACTIONS      Home Medications:  Prior to Admission medications   Medication Sig Start Date End Date Taking? Authorizing Provider  allopurinol  (ZYLOPRIM ) 100 MG tablet Take 1 tablet (100 mg total) by mouth daily. 02/13/24   Elgergawy, Brayton RAMAN, MD  amLODipine  (NORVASC ) 10 MG tablet Take 10 mg by mouth at bedtime. 08/05/21   [provider]  furosemide  (LASIX ) 80 MG tablet Take 1 tablet (80 mg total) by mouth daily. Patient taking differently: Take 80 mg by mouth 3 (three) times daily. 12/23/23   Fairy Frames, MD  irbesartan  (AVAPRO ) 300 MG tablet Take 1 tablet (300 mg total) by mouth daily. 02/03/24   Walker, Caitlin S, NP  lidocaine -prilocaine  (EMLA ) cream Apply 1 Application topically once. 01/14/24   [provider]  medroxyPROGESTERone  (DEPO-PROVERA ) 150 MG/ML injection Inject 1 mL (150 mg total) into the muscle every 3 (three) months. 10/29/22   Rudy Carlin LABOR, MD  nitroGLYCERIN  (NITROSTAT ) 0.4 MG SL tablet Place 1 tablet (0.4 mg total) under the tongue every 5 (five) minutes as needed for chest pain. 10/28/23   Rai, Nydia POUR, MD  ondansetron  (ZOFRAN -ODT) 4 MG disintegrating tablet Take 1 tablet (4 mg total) by mouth every 8 (eight) hours as needed for nausea or vomiting. 10/28/23   Rai, Nydia POUR, MD  oxyCODONE  (OXY IR/ROXICODONE ) 5 MG immediate release tablet Take 1 tablet (5 mg total) by mouth every 4 (four) hours as needed for moderate pain (pain score 4-6) or breakthrough pain. 02/12/24   Elgergawy, Brayton RAMAN, MD  sevelamer  carbonate (RENVELA ) 2.4 g PACK Take 2.4 g by  mouth 2 (two) times daily with a meal. 01/21/24   [provider]  tiZANidine  (ZANAFLEX ) 4 MG tablet Take 4 mg by mouth every 8 (eight) hours as needed for muscle spasms. 12/03/23   [provider]   Scheduled Meds:  [START ON 03/08/2024] Chlorhexidine  Gluconate Cloth  6 each Topical Q0600   heparin  injection (subcutaneous)  5,000 Units Subcutaneous Q8H   insulin  aspart  0-6 Units Subcutaneous Q4H   mouth rinse  15 mL Mouth Rinse Q2H   Continuous Infusions:  acetaminophen      anticoagulant sodium citrate      dexmedetomidine  (PRECEDEX ) IV infusion Stopped (03/07/24 1155)   dextrose  50 mL/hr at 03/07/24 1255   piperacillin -tazobactam (ZOSYN )  IV Stopped (03/07/24 1058)   PRN Meds: alteplase , anticoagulant sodium citrate , docusate sodium , heparin , labetalol , lidocaine  (PF), lidocaine -prilocaine , menthol -cetylpyridinium, mouth rinse, pentafluoroprop-tetrafluoroeth, polyethylene glycol  Allergies:    Allergies  Allergen Reactions  Iodine  Hives and Shortness Of Breath   Lisinopril  Anaphylaxis    Angioedema   Gabapentin Other (See Comments)    Reaction: Tremor (intolerance); tremor   Ativan  [Lorazepam ] Other (See Comments)    Somnolent with 1mg  ativan  at [facility] Nov 2019. Patient stated it made her feel like she shut down and was mute and non-responding.   Hydroxychloroquine  Other (See Comments)    Pt reports making her skin peel really bad   Vicodin [Hydrocodone -Acetaminophen ] Itching and Rash   Social History:   Social History   Socioeconomic History   Marital status: Single    Spouse name: Not on file   Number of children: 0   Years of education: Not on file   Highest education level: Some college, no degree  Occupational History   Not on file  Tobacco Use   Smoking status: Every Day    Current packs/day: 0.25    Average packs/day: 0.3 packs/day for 9.1 years (2.3 ttl pk-yrs)    Types: E-cigarettes, Cigarettes    Start date: 02/11/2015   Smokeless  tobacco: Never   Tobacco comments:    02/03/2024 Patient smokes 3 cigarettes daily  Vaping Use   Vaping status: Every Day  Substance and Sexual Activity   Alcohol use: No    Alcohol/week: 0.0 standard drinks of alcohol   Drug use: No   Sexual activity: Yes    Partners: Male    Birth control/protection: None  Other Topics Concern   Not on file  Social History Narrative   Lives with boyfriend   Social Drivers of Health   Financial Resource Strain: Low Risk  (02/03/2024)   Overall Financial Resource Strain (CARDIA)    Difficulty of Paying Living Expenses: Not hard at all  Food Insecurity: No Food Insecurity (02/09/2024)   Hunger Vital Sign    Worried About Running Out of Food in the Last Year: Never true    Ran Out of Food in the Last Year: Never true  Recent Concern: Food Insecurity - Food Insecurity Present (11/25/2023)   Hunger Vital Sign    Worried About Running Out of Food in the Last Year: Sometimes true    Ran Out of Food in the Last Year: Sometimes true  Transportation Needs: Unmet Transportation Needs (02/09/2024)   PRAPARE - Administrator, Civil Service (Medical): Yes    Lack of Transportation (Non-Medical): No  Physical Activity: Insufficiently Active (02/03/2024)   Exercise Vital Sign    Days of Exercise per Week: 2 days    Minutes of Exercise per Session: 10 min  Stress: No Stress Concern Present (02/03/2024)   [employer] of Occupational Health - Occupational Stress Questionnaire    Feeling of Stress : Not at all  Social Connections: Socially Isolated (02/03/2024)   Social Connection and Isolation Panel    Frequency of Communication with Friends and Family: Never    Frequency of Social Gatherings with Friends and Family: Once a week    Attends Religious Services: Never    Database administrator or Organizations: Yes    Attends Banker Meetings: Never    Marital Status: Never married  Intimate Partner Violence: Not At Risk (02/09/2024)    Humiliation, Afraid, Rape, and Kick questionnaire    Fear of Current or Ex-Partner: No    Emotionally Abused: No    Physically Abused: No    Sexually Abused: No    Family History:   Family History  Problem Relation Age of  Onset   Lupus Mother    Hypertension Mother    Kidney disease Mother    Heart Problems Mother    Coronary artery disease Mother 7       stent   Diabetes Maternal Grandmother     ROS:  Please see the history of present illness.  All other ROS reviewed and negative.     Physical Exam/Data: Vitals:   03/07/24 1245 03/07/24 1412 03/07/24 1431 03/07/24 1500  BP:  (!) 129/101 (!) 128/99 (!) 143/110  Pulse: 75 79 78 78  Resp: (!) 21 (!) 23 20 15   Temp:  97.6 F (36.4 C)    TempSrc:      SpO2: 100% 100% 100% 100%  Weight:  60.9 kg    Height:        Intake/Output Summary (Last 24 hours) at 03/07/2024 1529 Last data filed at 03/07/2024 1200 Gross per 24 hour  Intake 1608.93 ml  Output 3200 ml  Net -1591.07 ml      03/07/2024    2:12 PM 03/07/2024    9:00 AM 03/06/2024    4:05 AM  Last 3 Weights  Weight (lbs) 134 lb 4.2 oz 131 lb 9.8 oz 165 lb 5.5 oz  Weight (kg) 60.9 kg 59.7 kg 75 kg     Body mass index is 21.67 kg/m.  General:  in no acute distress, tearful, on BiPAP HEENT: normal Neck: no JVD Vascular:  Distal pulses 2+ bilaterally Cardiac:  normal S1, S2; RRR; no murmur Lungs:  clear to auscultation bilaterally, no wheezing, rhonchi or rales  Abd: soft, nontender, no hepatomegaly  Ext: no edema Musculoskeletal:  No deformities Skin: warm and dry  Neuro:  no focal abnormalities noted Psych:  Normal affect   EKG:  The EKG was personally reviewed and demonstrates:  sinus rhythm, HR 95  Relevant CV Studies:  Echocardiogram, 03/06/2024 Left ventricular ejection fraction, by estimation, is 30 to 35% . The left ventricle has moderately decreased function. The left ventricle demonstrates global hypokinesis. The left ventricular internal cavity size was  moderately dilated. There is mild left ventricular hypertrophy. Left ventricular diastolic parameters are indeterminate.  Right ventricular systolic function is normal. The right ventricular size is normal.  Left atrial size was moderately dilated.  Right atrial size was moderately dilated.  The mitral valve is abnormal. Mild mitral valve regurgitation. No evidence of mitral stenosis.  Tricuspid valve regurgitation is moderate.  The aortic valve is normal in structure. Aortic valve regurgitation is not visualized. No aortic stenosis is present.  The inferior vena cava is normal in size with greater than 50% respiratory variability, suggesting right atrial pressure of 3 mmHg.  Laboratory Data: High Sensitivity Troponin:   Recent Labs  Lab 02/08/24 1451 03/07/24 1155 03/07/24 1310  TROPONINIHS 42* 81* 73*     Chemistry Recent Labs  Lab 03/06/24 0417 03/06/24 0815 03/06/24 0845 03/06/24 1101 03/07/24 0544 03/07/24 1026  NA 137  --    < > 136 128* 135  K 5.2*  --    < > 5.4* 4.5 4.7  CL 99  --   --   --  89* 92*  CO2 22  --   --   --  24 25  GLUCOSE 79  --   --   --  53* 89  BUN 49*  --   --   --  27* 29*  CREATININE 9.36* 9.76*  --   --  5.59* 6.50*  CALCIUM  9.4  --   --   --  8.9 9.0  GFRNONAA 5* 5*  --   --  10* 8*  ANIONGAP 16*  --   --   --  15 18*   < > = values in this interval not displayed.    Recent Labs  Lab 03/06/24 0417 03/07/24 0544  PROT 7.5 7.3  ALBUMIN  3.1* 2.9*  AST 17 21  ALT 11 10  ALKPHOS 85 66  BILITOT 0.6 1.8*   Lipids  Recent Labs  Lab 03/07/24 0544  TRIG 1,332*    Hematology Recent Labs  Lab 03/06/24 0417 03/06/24 0815 03/06/24 0845 03/06/24 1101 03/07/24 0544  WBC 7.5 13.7*  --   --  8.3  RBC 3.33* 3.71*  --   --  3.49*  HGB 9.3* 10.4* 12.2 11.9* 9.9*  HCT 31.4* 34.8* 36.0 35.0* 31.4*  MCV 94.3 93.8  --   --  90.0  MCH 27.9 28.0  --   --  28.4  MCHC 29.6* 29.9*  --   --  31.5  RDW 18.1* 18.1*  --   --  18.0*  PLT 206 243   --   --  232   Thyroid No results for input(s): TSH, FREET4 in the last 168 hours.  BNPNo results for input(s): BNP, PROBNP in the last 168 hours.  DDimer No results for input(s): DDIMER in the last 168 hours.  Radiology/Studies:  DG CHEST PORT 1 VIEW Result Date: 03/07/2024 CLINICAL DATA:  [id number] Respiratory failure with hypoxia (HCC) [id number] EXAM: PORTABLE CHEST - 1 VIEW COMPARISON:  Multiple, most recently March 06, 2024 FINDINGS: Similarly positioned endotracheal tube terminating in the lower trachea. Esophagogastric tube courses below the diaphragm with the distal tip not included in the field of view. Mild persistent interstitial edema with otherwise improvement in the pulmonary edema. Persistent retrocardiac airspace opacities. No new airspace consolidation or pneumothorax. Trace right pleural effusion. Moderate cardiomegaly. IMPRESSION: 1. Improving pulmonary edema with mild persistent interstitial edema. Retrocardiac airspace disease is unchanged. 2. Similar positioning of the support tubes, as described above. Electronically Signed   By: Rogelia Myers M.D.   On: 03/07/2024 08:47   DG Abd Portable 1V Result Date: 03/06/2024 CLINICAL DATA:  Orogastric tube placement. EXAM: PORTABLE ABDOMEN - 1 VIEW COMPARISON:  CT earlier today FINDINGS: Tip and side port of the enteric tube below the diaphragm in the stomach. Generalized paucity of bowel gas. No gaseous bowel distension. IMPRESSION: Tip and side port of the enteric tube below the diaphragm in the stomach. Electronically Signed   By: Andrea Gasman M.D.   On: 03/06/2024 15:24   ECHOCARDIOGRAM COMPLETE Result Date: 03/06/2024    ECHOCARDIOGRAM REPORT   Patient Name:   Linda [name] Date of Exam: 03/06/2024 Medical Rec #:  [id number]             Height:       66.0 in Accession #:    [id number]            Weight:       165.3 lb Date of Birth:  Nov 30, 1995             BSA:          1.844 m Patient Age:    27 years              BP:            135/108 mmHg Patient Gender: F  HR:           80 bpm. Exam Location:  Inpatient Procedure: 2D Echo, Color Doppler and Cardiac Doppler (Both Spectral and Color            Flow Doppler were utilized during procedure). Indications:    Pericardial effusion  History:        Patient has prior history of Echocardiogram examinations, most                 recent 10/25/2023.  Sonographer:    Benard Stallion Referring Phys: DORETHIA CAVE IMPRESSIONS  1. Left ventricular ejection fraction, by estimation, is 30 to 35%. The left ventricle has moderately decreased function. The left ventricle demonstrates global hypokinesis. The left ventricular internal cavity size was moderately dilated. There is mild  left ventricular hypertrophy. Left ventricular diastolic parameters are indeterminate.  2. Right ventricular systolic function is normal. The right ventricular size is normal.  3. Left atrial size was moderately dilated.  4. Right atrial size was moderately dilated.  5. The mitral valve is abnormal. Mild mitral valve regurgitation. No evidence of mitral stenosis.  6. Tricuspid valve regurgitation is moderate.  7. The aortic valve is normal in structure. Aortic valve regurgitation is not visualized. No aortic stenosis is present.  8. The inferior vena cava is normal in size with greater than 50% respiratory variability, suggesting right atrial pressure of 3 mmHg. FINDINGS  Left Ventricle: Left ventricular ejection fraction, by estimation, is 30 to 35%. The left ventricle has moderately decreased function. The left ventricle demonstrates global hypokinesis. Strain was performed and the global longitudinal strain is indeterminate. The left ventricular internal cavity size was moderately dilated. There is mild left ventricular hypertrophy. Left ventricular diastolic parameters are indeterminate. Right Ventricle: The right ventricular size is normal. No increase in right ventricular wall thickness. Right  ventricular systolic function is normal. Left Atrium: Left atrial size was moderately dilated. Right Atrium: Right atrial size was moderately dilated. Pericardium: There is no evidence of pericardial effusion. Mitral Valve: The mitral valve is abnormal. There is mild thickening of the mitral valve leaflet(s). Mild mitral valve regurgitation. No evidence of mitral valve stenosis. Tricuspid Valve: The tricuspid valve is normal in structure. Tricuspid valve regurgitation is moderate . No evidence of tricuspid stenosis. Aortic Valve: The aortic valve is normal in structure. Aortic valve regurgitation is not visualized. No aortic stenosis is present. Aortic valve mean gradient measures 3.0 mmHg. Aortic valve peak gradient measures 4.8 mmHg. Aortic valve area, by VTI measures 2.74 cm. Pulmonic Valve: The pulmonic valve was normal in structure. Pulmonic valve regurgitation is mild. No evidence of pulmonic stenosis. Aorta: The aortic root is normal in size and structure. Venous: The inferior vena cava is normal in size with greater than 50% respiratory variability, suggesting right atrial pressure of 3 mmHg. IAS/Shunts: No atrial level shunt detected by color flow Doppler. Additional Comments: 3D was performed not requiring image post processing on an independent workstation and was indeterminate.  LEFT VENTRICLE PLAX 2D LVIDd:         4.80 cm      Diastology LVIDs:         4.00 cm      LV e' medial:    5.87 cm/s LV PW:         1.50 cm      LV E/e' medial:  17.0 LV IVS:        1.20 cm      LV e' lateral:  9.03 cm/s LVOT diam:     2.20 cm      LV E/e' lateral: 11.1 LV SV:         56 LV SV Index:   30 LVOT Area:     3.80 cm  LV Volumes (MOD) LV vol d, MOD A2C: 142.0 ml LV vol d, MOD A4C: 115.0 ml LV vol s, MOD A2C: 101.0 ml LV vol s, MOD A4C: 76.8 ml LV SV MOD A2C:     41.0 ml LV SV MOD A4C:     115.0 ml LV SV MOD BP:      38.4 ml RIGHT VENTRICLE RV Basal diam:  4.50 cm RV Mid diam:    3.30 cm RV S prime:     8.70 cm/s  TAPSE (M-mode): 1.1 cm LEFT ATRIUM              Index        RIGHT ATRIUM           Index LA Vol (A2C):   135.0 ml 73.19 ml/m  RA Area:     25.40 cm LA Vol (A4C):   91.3 ml  49.50 ml/m  RA Volume:   92.40 ml  50.10 ml/m LA Biplane Vol: 112.0 ml 60.72 ml/m  AORTIC VALVE AV Area (Vmax):    3.12 cm AV Area (Vmean):   2.98 cm AV Area (VTI):     2.74 cm AV Vmax:           110.00 cm/s AV Vmean:          75.600 cm/s AV VTI:            0.204 m AV Peak Grad:      4.8 mmHg AV Mean Grad:      3.0 mmHg LVOT Vmax:         90.30 cm/s LVOT Vmean:        59.200 cm/s LVOT VTI:          0.147 m LVOT/AV VTI ratio: 0.72  AORTA Ao Root diam: 3.00 cm Ao Asc diam:  3.00 cm MITRAL VALVE                TRICUSPID VALVE MV Area (PHT): 3.91 cm     TR Peak grad:   21.9 mmHg MV Decel Time: 194 msec     TR Vmax:        234.00 cm/s MV E velocity: 100.00 cm/s MV A velocity: 40.80 cm/s   SHUNTS MV E/A ratio:  2.45         Systemic VTI:  0.15 m                             Systemic Diam: 2.20 cm Maude Emmer MD Electronically signed by Maude Emmer MD Signature Date/Time: 03/06/2024/3:19:22 PM    Final    DG CHEST PORT 1 VIEW Result Date: 03/06/2024 CLINICAL DATA:  Endotracheal tube EXAM: PORTABLE CHEST 1 VIEW COMPARISON:  Radiographs 03/06/2024 FINDINGS: Endotracheal to 1.2 cm from carina. Consider retraction retraction by 2-3 cm. Stable large cardiac silhouette. Central venous congestion and pulmonary edema pattern is slightly improved. Small effusions. No pneumothorax IMPRESSION: 1. Endotracheal tube tip 1.2 cm from carina. Consider retraction by 2-3 cm. 2. Slight improvement in pulmonary edema. Electronically Signed   By: Jackquline Boxer M.D.   On: 03/06/2024 10:31   DG Chest Port 1 View Result Date: 03/06/2024 CLINICAL DATA:  Severe dyspnea.  EXAM: PORTABLE CHEST 1 VIEW COMPARISON:  02/08/2024 FINDINGS: The cardio pericardial silhouette is enlarged. Vascular congestion with diffuse interstitial and hazy airspace disease suggests edema.  Probable trace effusions. Telemetry leads overlie the chest. IMPRESSION: Enlargement of the cardiopericardial silhouette with vascular congestion and pulmonary edema. Electronically Signed   By: Camellia Candle M.D.   On: 03/06/2024 08:20   CT Renal Stone Study Result Date: 03/06/2024 CLINICAL DATA:  Abdominal and flank pain, stone suspected EXAM: CT ABDOMEN AND PELVIS WITHOUT CONTRAST TECHNIQUE: Multidetector CT imaging of the abdomen and pelvis was performed following the standard protocol without IV contrast. RADIATION DOSE REDUCTION: This exam was performed according to the departmental dose-optimization program which includes automated exposure control, adjustment of the mA and/or kV according to patient size and/or use of iterative reconstruction technique. COMPARISON:  02/08/2024 FINDINGS: Lower chest: Cardiomegaly. Small pericardial effusion, similar to prior. Trace pleural effusions and interlobular septal thickening throughout the lung bases. Hepatobiliary: No solid liver abnormality is seen. Hepatomegaly, maximum coronal span 23.8 cm. No gallstones, gallbladder wall thickening, or biliary dilatation. Pancreas: Unremarkable. No pancreatic ductal dilatation or surrounding inflammatory changes. Spleen: Normal in size without significant abnormality. Adrenals/Urinary Tract: Adrenal glands are unremarkable. Mildly atrophic kidneys. No calculi or hydronephrosis. Bladder is unremarkable. Stomach/Bowel: Stomach is within normal limits. Appendix appears normal. No evidence of bowel wall thickening, distention, or inflammatory changes. Vascular/Lymphatic: Scattered infrarenal abdominal aortic atherosclerosis. Enlarged retroperitoneal lymph nodes with scattered calcifications. Reproductive: No mass or other significant abnormality. Other: No abdominal wall hernia. Anasarca. Dystrophic soft tissue calcifications throughout the buttocks. No ascites. Musculoskeletal: No acute or significant osseous findings. Renal  osteodystrophy. IMPRESSION: 1. No acute noncontrast CT findings of the abdomen or pelvis to explain abdominal pain. Atrophic kidneys. No urinary tract calculi or hydronephrosis. 2. Cardiomegaly with small pericardial effusion and bibasilar pulmonary edema. 3. Hepatomegaly. 4. Enlarged retroperitoneal lymph nodes with scattered calcifications, in keeping with history of lupus. 5. Renal osteodystrophy. 6. Anasarca. Aortic Atherosclerosis (ICD10-I70.0). Electronically Signed   By: Marolyn JONETTA Jaksch M.D.   On: 03/06/2024 06:39   Assessment and Plan: Hypertension, uncontrolled  Presented with BP as high as 200/152  Complicated ED course with flash pulmonary edema, requiring BiPAP and then intubation Home meds: nebivolol  10 mg daily, irbesartan  300 mg daily, Lasix  80 mg daily, amlodipine  10 mg daily  Patient noncompliant with HD sessions -- leading to multiple admissions Most recent BP 128/99 with HR 70s  Continue with HD per nephrology Continue PRN IV labetalol  Resume with home antihypertensive regimen when able, per nephrology    Acute on Chronic HFrEF Echo this admission showed: LVEF 30-35% (45% in 2023), mild LVH, BAE, mild MR, moderate TR, normal IVC Patient noncompliant with HD sessions leading to admissions  GDMT limited by ESRD Home meds as above Continue with HD per nephrology Defer any future Lasix  dosing to nephrology   Chronic pain Patient has history of chronic pain Takes oxycodone  at baseline Complains of full body pain Primary MD was contacted and ordered IV Tylenol  If she requires further escalation of pain management will defer to primary  Sore throat Patient reports sore throat post-extubation  Ordered Cepacol lozenge PRN for sore throat   Per primary Noncompliance Acute hypoxic repiratory failure ESRD on HD (M/W/F) Hypoglycemia  SLE Lupus nephritis  Chronic anemia   Risk Assessment/Risk Scores:      [place]  Heart Association (NYHA) Functional Class NYHA Class  III    For questions or updates, please contact [facility] Please consult  [email or web address] for contact info under    Signed, Waddell DELENA Donath, PA-C  03/07/2024 3:29 PM  Patient seen, examined. Available data reviewed. Agree with findings, assessment, and plan as outlined by Waddell Donath, PA-C.  The patient is independently interviewed and examined in the inpatient hemodialysis unit.  History is quite limited as she has BiPAP in place.  The patient is awake and alert, she appears to be breathing comfortably with positive pressure ventilation.  Lungs with inspiratory crackles, JVP appears to be normal, heart is regular rate and rhythm with no murmur or gallop, abdomen is soft and nontender, extremities have no edema.  The patient's echocardiogram demonstrates severe LV dysfunction with LVEF 30 to 35% with a pattern of global LV hypokinesis.  RV function is normal.  There is mild mitral regurgitation and moderate tricuspid regurgitation with no evidence of aortic valve stenosis.  This is a medically very complex young woman with underlying lupus nephritis and end-stage renal disease who presents with abdominal pain, nausea, vomiting, and diarrhea.  During her evaluation she collapsed in the bathroom and was in acute respiratory distress requiring BiPAP and then intubation.  She was noted to be severely hypotensive.  Unfortunately, the patient's treatment options are limited to volume removal with hemodialysis and aggressive antihypertensive medical therapy.  Compliance is a key issue both with dialysis and medication adherence.  The patient has a global nonischemic cardiomyopathy likely related to hypertensive heart disease.  Once aggressive volume removal with dialysis is completed, she will be started back on her oral antihypertensive regimen.  GDMT for heart failure is limited in the context of her end-stage renal disease.  Her home antihypertensive medications have included Nebivolol ,  irbesartan , furosemide , and amlodipine .  Our team will follow up in the morning so that we can have a lower extensive discussion with her once she is off BiPAP.  Hopefully she will continue to progress with volume removal during dialysis and will transition from BiPAP to oxygen per nasal cannula.  Otherwise, as outlined above.    Ozell Fell, M.D. 03/07/2024 3:44 PM

## 2024-03-07 NOTE — Progress Notes (Addendum)
 [facility] Progress Note  Subjective:  3.2 L off w/ HD yesterday Extubated this am w/ good weaning parameters Is on bipap as a precaution post -extubation per CCM  Vitals:   03/07/24 0745 03/07/24 0800 03/07/24 0815 03/07/24 0830  BP:  (!) 154/116  (!) 145/111  Pulse: 95 97 99 90  Resp: (!) 33 (!) 26 18 18   Temp:      TempSrc:      SpO2: 100% 100% 99% 97%  Weight:      Height:        Exam: Gen on bipap, sedated, normal RR, much better No jvd or bruits Chest cta bilat  RRR no MRG, tachy Abd soft ntnd no mass or ascites +bs Ext no pitting edema Neuro as above    LUE AVG+bruit     OP HD: AF MWF 3.5h   B350  69kg   AVG LUE  Hep none      Assessment/ Plan: Resp distress: w/ pulm edema by CXR and on exam. BP severely high initially. Rec'd IV ntg gtt + IV hydralazine . CCM intubated. Had HD overnight w/ 3.2 L off. Per CCM pt weaned from vent very well this am. Placed on bipap post extubation as a precaution. Plan HD again today, still is vol overloaded.  ESRD: on HD MWF. HD last night. Next HD as above. Uncont HTN: s/p IV ntg yesterday. Will add scheduled IV metoprolol  for now. Resume home bp meds when ready.  Volume: no BP drop on HD, need new wt post HD, suspect still overloaded Anemia of esrd: Hb 9-12 here, follow.     Myer Fret MD  [facility] 03/07/2024, 8:51 AM  Recent Labs  Lab 03/06/24 0417 03/06/24 0815 03/06/24 0845 03/06/24 1101 03/07/24 0544  HGB 9.3* 10.4*   < > 11.9* 9.9*  ALBUMIN  3.1*  --   --   --  2.9*  CALCIUM  9.4  --   --   --  8.9  CREATININE 9.36* 9.76*  --   --  5.59*  K 5.2*  --    < > 5.4* 4.5   < > = values in this interval not displayed.   No results for input(s): IRON , TIBC, FERRITIN in the last 168 hours. Inpatient medications:  Chlorhexidine  Gluconate Cloth  6 each Topical Q0600   docusate  100 mg Per Tube BID   heparin  injection (subcutaneous)  5,000 Units Subcutaneous Q8H   insulin  aspart  0-6 Units Subcutaneous  Q4H   mouth rinse  15 mL Mouth Rinse Q2H   pantoprazole  (PROTONIX ) IV  40 mg Intravenous Q24H   polyethylene glycol  17 g Per Tube Daily    dexmedetomidine  (PRECEDEX ) IV infusion 0.4 mcg/kg/hr (03/07/24 0823)   dextrose  75 mL/hr at 03/07/24 0800   piperacillin -tazobactam (ZOSYN )  IV Stopped (03/07/24 0219)   propofol  (DIPRIVAN ) infusion 40 mcg/kg/min (03/07/24 0800)   fentaNYL  (SUBLIMAZE ) injection, labetalol , midazolam , mouth rinse

## 2024-03-07 NOTE — Progress Notes (Signed)
 Received patient in bed to unit.  Alert and oriented.  Informed consent signed and in chart.   TX duration:3 hours  Patient tolerated well.  Transported back to the room  Alert, without acute distress.  Hand-off given to patient's nurse.   Access used: Left AV Upper arm fistula Access issues: none  Total UF removed: 3.5L Medication(s) given: IV Tylenol    03/07/24 1737  Vitals  Temp 97.8 F (36.6 C)  Temp Source Axillary  BP (!) 137/108  MAP (mmHg) 117  ECG Heart Rate 78  Resp 20  Oxygen Therapy  SpO2 100 %  O2 Device Bi-PAP  During Treatment Monitoring  Duration of HD Treatment -hour(s) 3 hour(s)  HD Safety Checks Performed Yes  Intra-Hemodialysis Comments Tx completed  Dialysis Fluid Bolus Normal Saline  Bolus Amount (mL) 300 mL  Post Treatment  Dialyzer Clearance Clear  Liters Processed 72  Fluid Removed (mL) 3500 mL  Tolerated HD Treatment Yes  AVG/AVF Arterial Site Held (minutes) 10 minutes  AVG/AVF Venous Site Held (minutes) 10 minutes  Fistula / Graft Left Upper arm  No placement date or time found.   Orientation: Left  Access Location: Upper arm  Status Deaccessed     Camellia Brasil LPN [facility]

## 2024-03-07 NOTE — Progress Notes (Signed)
 Pt receives out-pt HD at [facility] [place] on MWF 7:10 am chair time. Will assist as needed.   Lauraine Polite Renal Navigator [phone]

## 2024-03-07 NOTE — Progress Notes (Signed)
 Pt transported on BIPAP from 3M08 to HD bay 5 with RN without complications.

## 2024-03-07 NOTE — Progress Notes (Signed)
 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  She has a history of volume overload [CT June 2025 showed small volume ascites cardiomegaly and pulmonary edema and small pericardial effusion].  She has chronic anemia with hemoglobin 8.3 at baseline   Recent admissions include late February 2025 for missing dialysis send respiratory failure volume overload, late March 2025 for respiratory failure volume overload missing dialysis sessions, third week April 2025 with respiratory failure volume overload after missing hemodialysis sessions, mid May 2025 volume overload with respiratory failure and hypertension [systolic 160/diastolic 120] late May 2025 2-day admission for acute hypoxic respiratory failure due to missed hemodialysis sessions and volume overload, 4-day admission ending February 12, 2024 for volume overload and secondary hypoxemic respiratory failure requiring dialysis during which she got the skin biopsy/punch biopsy in the gluteal region   On 03/06/2024: Presents to the ED with abdominal pain nausea vomiting and diarrhea..  Initially triaged to the hospitalist service but at some point went to the bathroom and collapsed.  She said she was short of breath.  IV site was bleeding but this was controlled.  Was diaphoretic placed on nonrebreather.  Subsequently moved to BiPAP.  No injuries documented by EDP.  Systolic blood pressure was  extremely high 187 with a heart rate in the 140s.  Saturations in the 50s.  Chest x-ray with large volume overload.  Was seen by renal but was in respiratory extremis and CCM called urgently  PEr Grandmother patient has been compliant with HD   Intubated by Dr. Geronimo in the ED.  Pertinent  Medical History  Abnormal ECG, angioedema, ESRD on HD (MWF), GERD, HTN, lupus with discoid skin lesions, LVH, migraines, mixed connective tissue disease  Vapes and smokes cigarettes  Significant Hospital Events: Including procedures, antibiotic start and stop dates in addition to other pertinent events   03/06/24: admitted, intubated in ED 03/07/24: extubated  Interim History / Subjective:  HD overnight- removed 3.2L Hypoglycemic > got 1.5 amp D50, started on D10 infusion  Objective    Blood pressure (!) 145/112, pulse 77, temperature 97.7 F (36.5 C), temperature source Axillary, resp. rate 20, height 5' 6 (1.676 m), weight 59.7 kg, SpO2 100%.    Vent Mode: BIPAP;PCV FiO2 (%):  [40 %-100 %] 40 % Set Rate:  [20 bmp] 20 bmp Vt Set:  [470 mL] 470 mL PEEP:  [5 cmH20] 5 cmH20 Pressure Support:  [5 cmH20-10 cmH20] 5 cmH20 Plateau Pressure:  [26 cmH20] 26 cmH20   Intake/Output Summary (Last 24 hours) at 03/07/2024 1144 Last data filed at 03/07/2024 0800 Gross per 24 hour  Intake 1585.88 ml  Output 3200 ml  Net -1614.12 ml   Filed Weights   03/06/24 0405 03/07/24 0900  Weight: 75 kg 59.7 kg    Examination: General: sleeping comfortably in bed on BiPAP HENT: somewhat dry MM under bipap mask, atraumatic and normocephalic Lungs: transmitted upper airway sounds, CTAB Cardiovascular: RRR, normal S1/S2 Abdomen: NA BS, soft, nontender, nondistended  Extremities: SCDs in place on BLE, WWP Neuro: asleep, responds to noxious stimuli, moving all extremities spontaneously when awake Derm: discoid lesions on face, LUE fistula appears stable  Resolved problem list  N/a  Assessment and Plan    PULM Acute hypoxic respiratory failure 2/2 volume overload with pulm congestion in setting of missed HD sessions (though has been compliant recently per grandmother) Intubated and sedated 7/7 >> extubated 7/8 Repeat CXR with improved pulm edema --continue BiPAP  RENAL ESRD on MWF HD Hx missed HD sessions, multiple admits due to volume overload S/p HD 7/7, removed 3.2L --nephrology following, will continue MWF schedule --nephro recommends additional HD session later today  NEUROLOGIC Chronic pain, on oxycodone  at home Sedated on ventilator on 03/06/24 Off propofol  --weaning off precedex  --goal RASS score 0 to -1  CARDIOVASCULAR HTN at baseline, on amlodipine  and irbesartan  Severe HTN on admission with volume overload and pulm edema, s/p nitroglycerin  drip due to SBP in 190s --Currently at goal SBP (150-170) --Holding home anti-HTN meds --PRN labetalol   HFrEF Echo 03/06/24 with EF 30-35% (dec from prior 45% in Jan 23), hypokinetic LV, mod dilated LV, mild LVH, normal RV function, mod dilated LA and RA, mild MR, mod TR, normal AV Follows with cardiology outpt, GDMT limited by ESRD --caution with fluids --cardiology consult given worse echo, flash pulm edema  Abnormal EKG Repeat EKG this AM with corrected QT 453 --f/u trops --cardiac monitoring  HLD Last lipid panel 2022 with LDL 124, TG 163 Triglycerides elevated to 1332 likely 2/2 propofol  Lipase WNL --off propofol  now --CTM q3 days   INFECTIOUS DISEASE High risk for HAP Reported abd pain/diarrhea on admission, no evidence of this during admit Started on vanc, zosyn  in ED -d/c'd enteric precautions given no diarrhea currently -MRSA PCR neg > d/c'd vanc -continue Zosyn  -f/u procalcitonin -f/u blood cx -f/u GIPP  -f/u resp cx, legionella antigen, strep pneumo antigen   ENDOCRINE Hypoglycemic overnight, received 1.5 amp D50 and on D10 infusion Glucose 50s-70s this AM --continue D10 infusion at 57mL/hr --CTM  CBGs --vsSSI  RHEUMATOLOGY SLE Has seen [facility] rheum in the past, has appt 7/22. Last seen 2023 Not on immunosuppression currently. Has used thalidomide for discoid lupus, plaqeunil (d/c'd due to allergic rxn) --recommend outpatient f/u   MSK/DERM Gluteal skin lesions s/p punch biopsy June 2025 with lipoma (unlikely calciphylaxis), family concern --CTM, appears stable at this time  Best Practice (right click and Reselect all SmartList Selections daily)   Diet/type: NPO, will need bedside swallow to advance diet DVT prophylaxis prophylactic heparin   iso ESRD Pressure ulcer(s): present on admission - BL buttocks GI prophylaxis: N/A Bowel regimen: PRN colace, miralax  Lines: N/A Foley:  N/A Code Status:  full code Last date of multidisciplinary goals of care discussion [Not done, full code]  Labs   CBC: Recent Labs  Lab 03/06/24 0417 03/06/24 0815 03/06/24 0845 03/06/24 1101 03/07/24 0544  WBC 7.5 13.7*  --   --  8.3  NEUTROABS 4.0  --   --   --   --   HGB 9.3* 10.4* 12.2 11.9* 9.9*  HCT 31.4* 34.8* 36.0 35.0* 31.4*  MCV 94.3 93.8  --   --  90.0  PLT 206 243  --   --  232    Basic Metabolic Panel: Recent Labs  Lab 03/06/24 0417 03/06/24 0815 03/06/24 0845 03/06/24 1101 03/07/24 0544 03/07/24 1026  NA 137  --  136 136 128* 135  K 5.2*  --  5.2* 5.4* 4.5  4.7  CL 99  --   --   --  89* 92*  CO2 22  --   --   --  24 25  GLUCOSE 79  --   --   --  53* 89  BUN 49*  --   --   --  27* 29*  CREATININE 9.36* 9.76*  --   --  5.59* 6.50*  CALCIUM  9.4  --   --   --  8.9 9.0   GFR: Estimated Creatinine Clearance: 12.1 mL/min (A) (by C-G formula based on SCr of 6.5 mg/dL (H)). Recent Labs  Lab 03/06/24 0417 03/06/24 0815 03/07/24 0544  WBC 7.5 13.7* 8.3  LATICACIDVEN  --   --  1.2    Liver Function Tests: Recent Labs  Lab 03/06/24 0417 03/07/24 0544  AST 17 21  ALT 11 10  ALKPHOS 85 66  BILITOT 0.6 1.8*  PROT 7.5 7.3  ALBUMIN  3.1* 2.9*   Recent Labs   Lab 03/07/24 1026  LIPASE 26   No results for input(s): AMMONIA in the last 168 hours.  ABG    Component Value Date/Time   PHART 7.275 (L) 03/06/2024 1101   PCO2ART 56.5 (H) 03/06/2024 1101   PO2ART 110 (H) 03/06/2024 1101   HCO3 26.3 03/06/2024 1101   TCO2 28 03/06/2024 1101   ACIDBASEDEF 1.0 03/06/2024 1101   O2SAT 97 03/06/2024 1101     Coagulation Profile: No results for input(s): INR, PROTIME in the last 168 hours.  Cardiac Enzymes: No results for input(s): CKTOTAL, CKMB, CKMBINDEX, TROPONINI in the last 168 hours.  HbA1C: Hgb A1c MFr Bld  Date/Time Value Ref Range Status  03/06/2024 10:10 AM 4.5 (L) 4.8 - 5.6 % Final    Comment:    (NOTE) Diagnosis of Diabetes The following HbA1c ranges recommended by the American Diabetes Association (ADA) may be used as an aid in the diagnosis of diabetes mellitus.  Hemoglobin             Suggested A1C NGSP%              Diagnosis  <5.7                   Non Diabetic  5.7-6.4                Pre-Diabetic  >6.4                   Diabetic  <7.0                   Glycemic control for                       adults with diabetes.    04/09/2017 07:04 PM 4.6 (L) 4.8 - 5.6 % Final    Comment:    (NOTE)         Pre-diabetes: 5.7 - 6.4         Diabetes: >6.4         Glycemic control for adults with diabetes: <7.0     CBG: Recent Labs  Lab 03/07/24 0032 03/07/24 0106 03/07/24 0309 03/07/24 0338 03/07/24 0711  GLUCAP 63* 75 59* 67* 79    Review of Systems:   none  Past Medical History:  She,  has a past medical history of Abnormal ECG, Angioedema, ESRD on hemodialysis (HCC), GERD (gastroesophageal reflux disease), Hypertension, Lupus, LVH (left ventricular hypertrophy), Migraines, Mixed connective tissue disease (HCC),  Previous Dialysis patient [facility]), and Septic prepatellar bursitis of right knee (08/19/2017).   Surgical History:   Past Surgical History:  Procedure Laterality Date   GRAFT  APPLICATION Left 03/30/2019   Procedure: BRACHIAL CEPHALIC GRAFT CREATION;  Surgeon: Marea Selinda RAMAN, MD;  Location: [facility] ORS;  Service: Vascular;  Laterality: Left;   I & D EXTREMITY Right 08/06/2017   Procedure: IRRIGATION AND DEBRIDEMENT KNEE;  Surgeon: Kendal Franky SQUIBB, MD;  Location: [facility] OR;  Service: Orthopedics;  Laterality: Right;   IR THROMBECTOMY AV FISTULA W/THROMBOLYSIS/PTA/STENT INC/SHUNT/IMG LT Left 05/18/2022   IR US  GUIDE VASC ACCESS LEFT  05/18/2022   MULTIPLE TOOTH EXTRACTIONS       Social History:   reports that she has been smoking e-cigarettes and cigarettes. She started smoking about 9 years ago. She has a 2.3 pack-year smoking history. She has never used smokeless tobacco. She reports that she does not drink alcohol and does not use drugs.   Family History:  Her family history includes Coronary artery disease (age of onset: 7) in her mother; Diabetes in her maternal grandmother; Heart Problems in her mother; Hypertension in her mother; Kidney disease in her mother; Lupus in her mother.   Allergies Allergies  Allergen Reactions   Iodine  Hives and Shortness Of Breath   Lisinopril  Anaphylaxis    Angioedema   Gabapentin Other (See Comments)    Reaction: Tremor (intolerance); tremor   Ativan  [Lorazepam ] Other (See Comments)    Somnolent with 1mg  ativan  at [facility] Nov 2019. Patient stated it made her feel like she shut down and was mute and non-responding.   Hydroxychloroquine  Other (See Comments)    Pt reports making her skin peel really bad   Vicodin [Hydrocodone -Acetaminophen ] Itching and Rash     Home Medications  Prior to Admission medications   Medication Sig Start Date End Date Taking? Authorizing Provider  allopurinol  (ZYLOPRIM ) 100 MG tablet Take 1 tablet (100 mg total) by mouth daily. 02/13/24   Elgergawy, Brayton RAMAN, MD  amLODipine  (NORVASC ) 10 MG tablet Take 10 mg by mouth at bedtime. 08/05/21   [provider]  furosemide  (LASIX ) 80 MG tablet Take 1  tablet (80 mg total) by mouth daily. Patient taking differently: Take 80 mg by mouth 3 (three) times daily. 12/23/23   Fairy Frames, MD  irbesartan  (AVAPRO ) 300 MG tablet Take 1 tablet (300 mg total) by mouth daily. 02/03/24   Walker, Caitlin S, NP  lidocaine -prilocaine  (EMLA ) cream Apply 1 Application topically once. 01/14/24   [provider]  medroxyPROGESTERone  (DEPO-PROVERA ) 150 MG/ML injection Inject 1 mL (150 mg total) into the muscle every 3 (three) months. 10/29/22   Rudy Carlin LABOR, MD  nitroGLYCERIN  (NITROSTAT ) 0.4 MG SL tablet Place 1 tablet (0.4 mg total) under the tongue every 5 (five) minutes as needed for chest pain. 10/28/23   Rai, Nydia POUR, MD  ondansetron  (ZOFRAN -ODT) 4 MG disintegrating tablet Take 1 tablet (4 mg total) by mouth every 8 (eight) hours as needed for nausea or vomiting. 10/28/23   Rai, Ripudeep K, MD  oxyCODONE  (OXY IR/ROXICODONE ) 5 MG immediate release tablet Take 1 tablet (5 mg total) by mouth every 4 (four) hours as needed for moderate pain (pain score 4-6) or breakthrough pain. 02/12/24   Elgergawy, Brayton RAMAN, MD  sevelamer  carbonate (RENVELA ) 2.4 g PACK Take 2.4 g by mouth 2 (two) times daily with a meal. 01/21/24   [provider]  tiZANidine  (ZANAFLEX ) 4 MG tablet Take 4 mg by mouth every  8 (eight) hours as needed for muscle spasms. 12/03/23   [provider]     Critical care time:

## 2024-03-07 NOTE — Procedures (Signed)
 Extubation Procedure Note  Patient Details:   Name: Linda [name] DOB: [birth year] MRN: [id number]   Airway Documentation:    Vent end date: 03/07/24 Vent end time: 0900   Evaluation  O2 sats: stable throughout Complications: No apparent complications Patient did tolerate procedure well. Bilateral Breath Sounds: Clear, Diminished   Yes  Pt extubated to BIPAP 5/5 on 40% with RN at bedside per MD order. Positive cuff leak noted and VSS. Pt is tolerating well, RT will monitor.   Margarie CHRISTELLA Breen 03/07/2024, 9:48 AM

## 2024-03-07 NOTE — Progress Notes (Signed)
 Pt transported on BIPAP from HD05 to 3M08 without complications.

## 2024-03-07 NOTE — Progress Notes (Signed)
 Heart Failure Navigator Progress Note  Assessed for Heart & Vascular TOC clinic readiness.  Patient does not meet criteria due to ESRD on hemodialysis. No HF TOC.   Navigator will sign off at this time.   Randie Bustle, BSN, Scientist, clinical (histocompatibility and immunogenetics) Only

## 2024-03-08 DIAGNOSIS — J9601 Acute respiratory failure with hypoxia: Secondary | ICD-10-CM | POA: Diagnosis not present

## 2024-03-08 DIAGNOSIS — J810 Acute pulmonary edema: Secondary | ICD-10-CM | POA: Diagnosis not present

## 2024-03-08 DIAGNOSIS — Z992 Dependence on renal dialysis: Secondary | ICD-10-CM | POA: Diagnosis not present

## 2024-03-08 DIAGNOSIS — N186 End stage renal disease: Secondary | ICD-10-CM | POA: Diagnosis not present

## 2024-03-08 LAB — CBC WITH DIFFERENTIAL/PLATELET
Abs Immature Granulocytes: 0.03 K/uL (ref 0.00–0.07)
Basophils Absolute: 0.1 K/uL (ref 0.0–0.1)
Basophils Relative: 1 %
Eosinophils Absolute: 0.4 K/uL (ref 0.0–0.5)
Eosinophils Relative: 7 %
HCT: 31.9 % — ABNORMAL LOW (ref 36.0–46.0)
Hemoglobin: 9.8 g/dL — ABNORMAL LOW (ref 12.0–15.0)
Immature Granulocytes: 1 %
Lymphocytes Relative: 31 %
Lymphs Abs: 1.9 K/uL (ref 0.7–4.0)
MCH: 28.6 pg (ref 26.0–34.0)
MCHC: 30.7 g/dL (ref 30.0–36.0)
MCV: 93 fL (ref 80.0–100.0)
Monocytes Absolute: 0.8 K/uL (ref 0.1–1.0)
Monocytes Relative: 12 %
Neutro Abs: 3 K/uL (ref 1.7–7.7)
Neutrophils Relative %: 48 %
Platelets: 204 K/uL (ref 150–400)
RBC: 3.43 MIL/uL — ABNORMAL LOW (ref 3.87–5.11)
RDW: 17.9 % — ABNORMAL HIGH (ref 11.5–15.5)
WBC: 6.2 K/uL (ref 4.0–10.5)
nRBC: 0 % (ref 0.0–0.2)

## 2024-03-08 LAB — GLUCOSE, CAPILLARY
Glucose-Capillary: 104 mg/dL — ABNORMAL HIGH (ref 70–99)
Glucose-Capillary: 66 mg/dL — ABNORMAL LOW (ref 70–99)
Glucose-Capillary: 126 mg/dL — ABNORMAL HIGH (ref 70–99)
Glucose-Capillary: 99 mg/dL (ref 70–99)
Glucose-Capillary: 74 mg/dL (ref 70–99)
Glucose-Capillary: 87 mg/dL (ref 70–99)
Glucose-Capillary: 110 mg/dL — ABNORMAL HIGH (ref 70–99)

## 2024-03-08 LAB — BASIC METABOLIC PANEL WITH GFR
Anion gap: 16 — ABNORMAL HIGH (ref 5–15)
BUN: 20 mg/dL (ref 6–20)
CO2: 23 mmol/L (ref 22–32)
Calcium: 8.8 mg/dL — ABNORMAL LOW (ref 8.9–10.3)
Chloride: 92 mmol/L — ABNORMAL LOW (ref 98–111)
Creatinine, Ser: 5.09 mg/dL — ABNORMAL HIGH (ref 0.44–1.00)
GFR, Estimated: 11 mL/min — ABNORMAL LOW (ref >60)
Glucose, Bld: 68 mg/dL — ABNORMAL LOW (ref 70–99)
Potassium: 4.5 mmol/L (ref 3.5–5.1)
Sodium: 131 mmol/L — ABNORMAL LOW (ref 135–145)

## 2024-03-08 LAB — PROCALCITONIN: Procalcitonin: 20.57 ng/mL

## 2024-03-08 MED ORDER — CARVEDILOL 6.25 MG PO TABS: 6.2500 mg | ORAL_TABLET | Freq: Two times a day (BID) | ORAL | Status: DC

## 2024-03-08 MED ORDER — LABETALOL HCL 5 MG/ML IV SOLN
5.0000 mg | INTRAVENOUS | Status: DC | PRN
  Administered 2024-03-09 – 2024-03-11 (×2): 10 mg via INTRAVENOUS
  Filled 2024-03-08 (×2): qty 4

## 2024-03-08 MED ORDER — TIZANIDINE HCL 2 MG PO TABS
2.0000 mg | ORAL_TABLET | Freq: Two times a day (BID) | ORAL | Status: DC | PRN
  Administered 2024-03-08: 2 mg via ORAL
  Filled 2024-03-08 (×2): qty 1

## 2024-03-08 MED ORDER — PANTOPRAZOLE SODIUM 40 MG PO TBEC
40.0000 mg | DELAYED_RELEASE_TABLET | Freq: Every day | ORAL | Status: DC
  Administered 2024-03-08 – 2024-03-11 (×4): 40 mg via ORAL
  Filled 2024-03-08 (×4): qty 1

## 2024-03-08 MED ORDER — CARVEDILOL 3.125 MG PO TABS
3.1250 mg | ORAL_TABLET | Freq: Two times a day (BID) | ORAL | Status: DC
  Administered 2024-03-08: 3.125 mg via ORAL
  Filled 2024-03-08 (×2): qty 1

## 2024-03-08 MED ORDER — OXYCODONE HCL 5 MG PO TABS
10.0000 mg | ORAL_TABLET | Freq: Four times a day (QID) | ORAL | Status: DC | PRN
  Administered 2024-03-08 – 2024-03-10 (×6): 10 mg via ORAL
  Filled 2024-03-08 (×6): qty 2

## 2024-03-08 MED ORDER — AMLODIPINE BESYLATE 10 MG PO TABS
10.0000 mg | ORAL_TABLET | Freq: Every day | ORAL | Status: DC
  Administered 2024-03-08 – 2024-03-10 (×3): 10 mg via ORAL
  Filled 2024-03-08 (×3): qty 1

## 2024-03-08 MED ORDER — IRBESARTAN 300 MG PO TABS
300.0000 mg | ORAL_TABLET | Freq: Every day | ORAL | Status: DC
  Administered 2024-03-08 – 2024-03-11 (×4): 300 mg via ORAL
  Filled 2024-03-08 (×4): qty 1

## 2024-03-08 MED ORDER — CHLORHEXIDINE GLUCONATE CLOTH 2 % EX PADS: 6.0000 | MEDICATED_PAD | Freq: Every day | CUTANEOUS | Status: DC

## 2024-03-08 MED ORDER — ACETAMINOPHEN 325 MG PO TABS
650.0000 mg | ORAL_TABLET | Freq: Four times a day (QID) | ORAL | Status: DC
  Filled 2024-03-08 (×5): qty 2

## 2024-03-08 NOTE — Progress Notes (Signed)
 0717 CBG 66  0730 Patient eating breakfast and has a cup of juice. Will re-check CBG when patient is done with breakfast/juice.

## 2024-03-08 NOTE — Progress Notes (Addendum)
 NAME:  Linda [name], MRN:  [id number], DOB:  06/13/1996, LOS: 2 ADMISSION DATE:  03/06/2024  History of Present Illness:  Presented with abdominal pain, nausea, vomiting, developed respiratory failure in the ED and required intubation and mechanical ventilation.  Likely in the setting of volume overload secondary to missed HD sessions.  Has had 2 HD sessions so far this admission.  Extubated yesterday and placed on temporary BiPAP.  Stable now on [place].  Pertinent  Medical History  SLE, discoid lupus, ESRD on MWF HD,  Significant Hospital Events: Including procedures, antibiotic start and stop dates in addition to other pertinent events   03/06/24: admitted, intubated in ED, HD 03/07/24: extubated, HD.  Weaned off prop and Precedex . 03/08/24: stable for transfer to floor  Interim History / Subjective:  Weaned off sedation and BiPAP Did not require as needed antihypertensive overnight  Afebrile overnight   Objective    Blood pressure (!) 131/98, pulse 82, temperature 98.7 F (37.1 C), temperature source Oral, resp. rate 16, height 5' 6 (1.676 m), weight 57.4 kg, SpO2 99%.    Vent Mode: BIPAP;PCV FiO2 (%):  [40 %] 40 % Set Rate:  [18 bmp] 18 bmp PEEP:  [5 cmH20] 5 cmH20 Pressure Support:  [5 cmH20] 5 cmH20   Intake/Output Summary (Last 24 hours) at 03/08/2024 0919 Last data filed at 03/08/2024 0700 Gross per 24 hour  Intake 1200.93 ml  Output 3500 ml  Net -2299.07 ml   Filed Weights   03/07/24 0900 03/07/24 1412 03/07/24 1809  Weight: 59.7 kg 60.9 kg 57.4 kg    Examination: General: Sitting up in bed on phone, awake and conversant, in no acute distress HENT: MMM, normocephalic atraumatic Lungs: CTAB, normal WOB on LFNC, no wheezes, rales, rhonchi. Occasional productive cough Cardiovascular: RRR, normal S1/S2, no M/R/G Abdomen: Normoactive bowel sounds, soft, mild TTP to epigastric region otherwise nontender, nondistended Extremities: SCDs in place Neuro: AAO x 3, moving  all extremities spontaneously, no focal deficits Skin: Warm, dry  Resolved problem list   Assessment and Plan  PULM #Acute hypoxic respiratory failure 2/2 volume overload with pulm congestion in setting of missed HD sessions (though has been compliant recently per grandmother) Intubated and sedated 7/7 >> extubated 7/8, place on BiPAP > transitioned to [facility] Repeat CXR 7/8 with improved pulm edema -- Continue LFNC with goal >92%   RENAL #ESRD on MWF HD #Hx missed HD sessions, multiple admits due to volume overload S/p HD x2, removed 6.7L thus far --nephrology following, will continue MWF HD schedule   NEUROLOGIC #S/p intubation and sedation, received propofol  and precedex  (d/c'd 7/8) #Chronic pain, on oxycodone  at home Off sedation. Much improved and back to MS baseline this AM -- IV tylenol  every 6 hours scheduled -- 10 mg oxy every 6 as needed -- Tizanidine  2 mg twice daily as needed --Avoiding IV pain medicines for now   CARDIOVASCULAR #HTN at baseline, on amlodipine , irbesartan , carvedilol , and lasix  at home #Severe HTN on admission with volume overload and pulm edema, s/p nitroglycerin  drip due to SBP in 190s Systolic pressures continue to be stable at goal  --Restarting home amlodipine  10, irbesartan  300, Coreg  6.25 twice daily --Holding Lasix  given patient makes very little urine --PRN labetalol , goal SBP 150-170    #HFrEF #Nonischemic cardiomyopathy secondary to hypertension Echo 03/06/24 with EF 30-35% (dec from prior 45% in Jan 23), hypokinetic LV, mod dilated LV, mild LVH, normal RV function, mod dilated LA and RA, mild MR, mod TR, normal AV  Follows with cardiology outpt, GDMT limited by ESRD --Per cardiology: Agree with aggressive diuresis via HD then restarting antihypertensives; GDMT limited in context of ESRD --Appreciate ongoing cardiology recommendations --caution with fluids   #Abnormal EKG on admit Repeat EKG 7/8 with corrected QT 453 Trops peaked, most  likely 2/2 decompensated heart failure --cardiac monitoring   #HLD Last lipid panel 2022 with LDL 124, TG 163 Triglycerides elevated to 1332 likely 2/2 propofol  Lipase WNL --CTM q3 days      INFECTIOUS DISEASE #Elevated procalcitonin #Likely immunosuppressed iso SLE #High risk for HAP Reported abd pain/diarrhea on admission, no evidence of this during admit Started on vanc, zosyn  in ED. Vanc d/c'd given neg MRSA swab. Procalcitonin significantly elevated to 19 > 20, 2/2 ?ESRD vs occult infection Blood cultures no growth x 2 days -continue Zosyn  -f/u resp cx, legionella antigen, strep pneumo antigen   ENDOCRINE #Hypoglycemia D10 infusion Dc'd @2000  7/8 Glucose 100s, 66 this AM Ha1c 4.5 --CTM CBGs q4h --vsSSI   RHEUMATOLOGY #SLE Has seen [facility] rheum in the past, has appt 7/22. Last seen 2023 Not on immunosuppression currently. Has used thalidomide for discoid lupus, plaqeunil (d/c'd due to allergic rxn) --recommend outpatient f/u     MSK/DERM #Gluteal skin lesions s/p punch biopsy June 2025 with lipoma (unlikely calciphylaxis), family concern --CTM, appears stable at this time  GI #Hx GERD, ? PUD Reports epigastric pain today, used to take Protonix  for GERD. --daily protonix  40mg  -- Recommend outpatient GI follow-up  Best Practice (right click and Reselect all SmartList Selections daily)   Diet/type: Regular consistency (see orders) DVT prophylaxis prophylactic heparin   Pressure ulcer(s): present on admission  GI prophylaxis: N/A Lines: N/A Foley:  N/A Code Status:  full code Last date of multidisciplinary goals of care discussion [n/a]  Labs   CBC: Recent Labs  Lab 03/06/24 0417 03/06/24 0815 03/06/24 0845 03/06/24 1101 03/07/24 0544 03/08/24 0608  WBC 7.5 13.7*  --   --  8.3 6.2  NEUTROABS 4.0  --   --   --   --  3.0  HGB 9.3* 10.4* 12.2 11.9* 9.9* 9.8*  HCT 31.4* 34.8* 36.0 35.0* 31.4* 31.9*  MCV 94.3 93.8  --   --  90.0 93.0  PLT 206 243  --    --  232 204    Basic Metabolic Panel: Recent Labs  Lab 03/06/24 0417 03/06/24 0815 03/06/24 0845 03/06/24 1101 03/07/24 0544 03/07/24 1026 03/08/24 0608  NA 137  --  136 136 128* 135 131*  K 5.2*  --  5.2* 5.4* 4.5 4.7 4.5  CL 99  --   --   --  89* 92* 92*  CO2 22  --   --   --  24 25 23   GLUCOSE 79  --   --   --  53* 89 68*  BUN 49*  --   --   --  27* 29* 20  CREATININE 9.36* 9.76*  --   --  5.59* 6.50* 5.09*  CALCIUM  9.4  --   --   --  8.9 9.0 8.8*   GFR: Estimated Creatinine Clearance: 14.9 mL/min (A) (by C-G formula based on SCr of 5.09 mg/dL (H)). Recent Labs  Lab 03/06/24 0417 03/06/24 0815 03/07/24 0544 03/07/24 1155 03/08/24 0608  PROCALCITON  --   --   --  19.41 20.57  WBC 7.5 13.7* 8.3  --  6.2  LATICACIDVEN  --   --  1.2  --   --  Liver Function Tests: Recent Labs  Lab 03/06/24 0417 03/07/24 0544  AST 17 21  ALT 11 10  ALKPHOS 85 66  BILITOT 0.6 1.8*  PROT 7.5 7.3  ALBUMIN  3.1* 2.9*   Recent Labs  Lab 03/07/24 1026  LIPASE 26   No results for input(s): AMMONIA in the last 168 hours.  ABG    Component Value Date/Time   PHART 7.275 (L) 03/06/2024 1101   PCO2ART 56.5 (H) 03/06/2024 1101   PO2ART 110 (H) 03/06/2024 1101   HCO3 26.3 03/06/2024 1101   TCO2 28 03/06/2024 1101   ACIDBASEDEF 1.0 03/06/2024 1101   O2SAT 97 03/06/2024 1101     Coagulation Profile: No results for input(s): INR, PROTIME in the last 168 hours.  Cardiac Enzymes: No results for input(s): CKTOTAL, CKMB, CKMBINDEX, TROPONINI in the last 168 hours.  HbA1C: Hgb A1c MFr Bld  Date/Time Value Ref Range Status  03/06/2024 10:10 AM 4.5 (L) 4.8 - 5.6 % Final    Comment:    (NOTE) Diagnosis of Diabetes The following HbA1c ranges recommended by the American Diabetes Association (ADA) may be used as an aid in the diagnosis of diabetes mellitus.  Hemoglobin             Suggested A1C NGSP%              Diagnosis  <5.7                   Non  Diabetic  5.7-6.4                Pre-Diabetic  >6.4                   Diabetic  <7.0                   Glycemic control for                       adults with diabetes.    04/09/2017 07:04 PM 4.6 (L) 4.8 - 5.6 % Final    Comment:    (NOTE)         Pre-diabetes: 5.7 - 6.4         Diabetes: >6.4         Glycemic control for adults with diabetes: <7.0     CBG: Recent Labs  Lab 03/07/24 1941 03/07/24 2349 03/08/24 0336 03/08/24 0717 03/08/24 0804  GLUCAP 68* 90 104* 66* 126*    Review of Systems:   Mild epigastric pain as above  Past Medical History:  She,  has a past medical history of Abnormal ECG, Angioedema, ESRD on hemodialysis (HCC), GERD (gastroesophageal reflux disease), Hypertension, Lupus, LVH (left ventricular hypertrophy), Migraines, Mixed connective tissue disease (HCC), Previous Dialysis patient [facility]), and Septic prepatellar bursitis of right knee (08/19/2017).   Surgical History:   Past Surgical History:  Procedure Laterality Date   GRAFT APPLICATION Left 03/30/2019   Procedure: BRACHIAL CEPHALIC GRAFT CREATION;  Surgeon: Marea Selinda RAMAN, MD;  Location: [facility] ORS;  Service: Vascular;  Laterality: Left;   I & D EXTREMITY Right 08/06/2017   Procedure: IRRIGATION AND DEBRIDEMENT KNEE;  Surgeon: Kendal Franky SQUIBB, MD;  Location: [facility] OR;  Service: Orthopedics;  Laterality: Right;   IR THROMBECTOMY AV FISTULA W/THROMBOLYSIS/PTA/STENT INC/SHUNT/IMG LT Left 05/18/2022   IR US  GUIDE VASC ACCESS LEFT  05/18/2022   MULTIPLE TOOTH EXTRACTIONS       Social History:   reports that she has  been smoking e-cigarettes and cigarettes. She started smoking about 9 years ago. She has a 2.3 pack-year smoking history. She has never used smokeless tobacco. She reports that she does not drink alcohol and does not use drugs.   Family History:  Her family history includes Coronary artery disease (age of onset: 48) in her mother; Diabetes in her maternal grandmother; Heart Problems in her  mother; Hypertension in her mother; Kidney disease in her mother; Lupus in her mother.   Allergies Allergies  Allergen Reactions   Iodine  Hives and Shortness Of Breath   Lisinopril  Anaphylaxis    Angioedema   Gabapentin Other (See Comments)    Reaction: Tremor (intolerance); tremor   Ativan  [Lorazepam ] Other (See Comments)    Somnolent with 1mg  ativan  at [facility] Nov 2019. Patient stated it made her feel like she shut down and was mute and non-responding.   Hydroxychloroquine  Other (See Comments)    Pt reports making her skin peel really bad   Vicodin [Hydrocodone -Acetaminophen ] Itching and Rash     Home Medications  Prior to Admission medications   Medication Sig Start Date End Date Taking? Authorizing Provider  allopurinol  (ZYLOPRIM ) 100 MG tablet Take 1 tablet (100 mg total) by mouth daily. 02/13/24   Elgergawy, Brayton RAMAN, MD  amLODipine  (NORVASC ) 10 MG tablet Take 10 mg by mouth at bedtime. 08/05/21   [provider]  furosemide  (LASIX ) 80 MG tablet Take 1 tablet (80 mg total) by mouth daily. Patient taking differently: Take 80 mg by mouth 3 (three) times daily. 12/23/23   Fairy Frames, MD  irbesartan  (AVAPRO ) 300 MG tablet Take 1 tablet (300 mg total) by mouth daily. 02/03/24   Walker, Caitlin S, NP  lidocaine -prilocaine  (EMLA ) cream Apply 1 Application topically once. 01/14/24   [provider]  medroxyPROGESTERone  (DEPO-PROVERA ) 150 MG/ML injection Inject 1 mL (150 mg total) into the muscle every 3 (three) months. 10/29/22   Rudy Carlin LABOR, MD  nitroGLYCERIN  (NITROSTAT ) 0.4 MG SL tablet Place 1 tablet (0.4 mg total) under the tongue every 5 (five) minutes as needed for chest pain. 10/28/23   Rai, Nydia POUR, MD  ondansetron  (ZOFRAN -ODT) 4 MG disintegrating tablet Take 1 tablet (4 mg total) by mouth every 8 (eight) hours as needed for nausea or vomiting. 10/28/23   Rai, Ripudeep K, MD  oxyCODONE  (OXY IR/ROXICODONE ) 5 MG immediate release tablet Take 1 tablet (5 mg  total) by mouth every 4 (four) hours as needed for moderate pain (pain score 4-6) or breakthrough pain. 02/12/24   Elgergawy, Brayton RAMAN, MD  sevelamer  carbonate (RENVELA ) 2.4 g PACK Take 2.4 g by mouth 2 (two) times daily with a meal. 01/21/24   [provider]  tiZANidine  (ZANAFLEX ) 4 MG tablet Take 4 mg by mouth every 8 (eight) hours as needed for muscle spasms. 12/03/23   [provider]     Critical care time: 30

## 2024-03-08 NOTE — Progress Notes (Signed)
 Pt refusing heparin  injection at this time. Pt educated on importance of medication and understands associated risks.

## 2024-03-08 NOTE — Progress Notes (Signed)
 [facility] Progress Note  Subjective:  3.2 L off w/ HD yesterday Extubated this am w/ good weaning parameters Is on bipap as a precaution post -extubation per CCM  Vitals:   03/08/24 0900 03/08/24 1000 03/08/24 1100 03/08/24 1128  BP: (!) 130/97 (!) 156/109 (!) 135/113   Pulse: 86 81 84   Resp: 20 20 15    Temp:    97.7 F (36.5 C)  TempSrc:    Oral  SpO2: 100% 100% 100%   Weight:      Height:        Exam: Gen on bipap, sedated, normal RR, much better No jvd or bruits Chest cta bilat  RRR no MRG, tachy Abd soft ntnd no mass or ascites +bs Ext no pitting edema Neuro as above    LUE AVG+bruit     OP HD: AF MWF 3.5h   B400  69kg   AVG LUE  Hep none    Assessment/ Plan: Resp distress: w/ pulm edema by CXR and on exam. BP severely high initially. Rec'd IV ntg gtt + IV hydralazine  in ED w/o improvement so was intubated by CCM. Had HD 7/07 w/ 3.2L UF then again 7/08 w/ 3.5L UF. No sig bp drop during either HD. She is under dry wt 12 kg, not sure wt's are good. Get stand wt. Looks better overall. On 4 L [place] now.  ESRD: on HD MWF. HD last 2 nights here. Next HD tomorrow.  Uncont HTN: BPs improved, cont po meds per CCM Volume: get stand wt if possible, cont to lower vol w/ HD Anemia of esrd: Hb 9-12 here, follow.     Myer Fret MD  [facility] 03/08/2024, 11:32 AM  Recent Labs  Lab 03/06/24 0417 03/06/24 0815 03/07/24 0544 03/07/24 1026 03/08/24 0608  HGB 9.3*   < > 9.9*  --  9.8*  ALBUMIN  3.1*  --  2.9*  --   --   CALCIUM  9.4  --  8.9 9.0 8.8*  CREATININE 9.36*   < > 5.59* 6.50* 5.09*  K 5.2*   < > 4.5 4.7 4.5   < > = values in this interval not displayed.   No results for input(s): IRON , TIBC, FERRITIN in the last 168 hours. Inpatient medications:  acetaminophen   650 mg Oral Q6H   amLODipine   10 mg Oral QHS   carvedilol   6.25 mg Oral BID WC   Chlorhexidine  Gluconate Cloth  6 each Topical Q0600   heparin  injection (subcutaneous)  5,000 Units  Subcutaneous Q8H   insulin  aspart  0-6 Units Subcutaneous Q4H   irbesartan   300 mg Oral Daily   mouth rinse  15 mL Mouth Rinse BID   pantoprazole   40 mg Oral Daily    piperacillin -tazobactam (ZOSYN )  IV 2.25 g (03/08/24 0928)   docusate sodium , labetalol , menthol -cetylpyridinium, mouth rinse, oxyCODONE , polyethylene glycol, tiZANidine 

## 2024-03-08 NOTE — Progress Notes (Signed)
  Progress Note  Patient Name: Linda [name] Date of Encounter: 03/08/2024 [facility] Cardiologist: None   Interval Summary   Feeling much better.  Pain controlled.  No chest pain or shortness of breath at present.  Vital Signs Vitals:   03/08/24 1100 03/08/24 1128 03/08/24 1200 03/08/24 1300  BP: (!) 135/113  (!) 146/114 (!) 149/108  Pulse: 84  62 (!) 188  Resp: 15  13 (!) 22  Temp:  97.7 F (36.5 C)    TempSrc:  Oral    SpO2: 100%  100% 100%  Weight:      Height:        Intake/Output Summary (Last 24 hours) at 03/08/2024 1323 Last data filed at 03/08/2024 1200 Gross per 24 hour  Intake 1082.02 ml  Output 4100 ml  Net -3017.98 ml      03/07/2024    6:09 PM 03/07/2024    2:12 PM 03/07/2024    9:00 AM  Last 3 Weights  Weight (lbs) 126 lb 8.7 oz 134 lb 4.2 oz 131 lb 9.8 oz  Weight (kg) 57.4 kg 60.9 kg 59.7 kg       Physical Exam  GEN: Chronically ill-appearing, no acute distress.   Neck: No JVD Cardiac: RRR, no murmurs, rubs, or gallops.  Respiratory: Clear to auscultation bilaterally. GI: Soft, nontender, non-distended  MS: No edema  Assessment & Plan  1.  Acute on chronic HFrEF: Patient with global LV dysfunction, LVEF 30 to 35%.  Suspect this is severe hypertensive cardiomyopathy.  Continue volume management with hemodialysis.  She has done much better since undergoing repeated hemodialysis sessions as an inpatient.  Outpatient diuretic dosing per nephrology.  She does still make urine. 2.  Hypertensive heart disease with malignant hypertension: Longstanding problem.  Patient treated at home with Nebivolol , irbesartan , furosemide , and amlodipine .  Defer management to nephrology who manages her blood pressure as an outpatient.  The patient reports she does not have issues with hypotension during dialysis sessions.  She takes her medications the same on dialysis and nondialysis days.  It sounds like she is not taking her medicines on a consistent basis at  home.  For other problems, including her chronic pain, lupus, and chronic anemia, defer to the primary team.  Not much else to offer from a cardiac perspective.  She will continue with volume management via hemodialysis.  Will sign off today.  Please call if any questions arise.  [facility] will sign off.    For questions or updates, please contact [facility] Please consult [email or web address] for contact info under       Signed, Ozell Fell, MD

## 2024-03-09 ENCOUNTER — Encounter (HOSPITAL_BASED_OUTPATIENT_CLINIC_OR_DEPARTMENT_OTHER): Admitting: Family

## 2024-03-09 DIAGNOSIS — J9601 Acute respiratory failure with hypoxia: Secondary | ICD-10-CM | POA: Diagnosis not present

## 2024-03-09 DIAGNOSIS — T82898A Other specified complication of vascular prosthetic devices, implants and grafts, initial encounter: Secondary | ICD-10-CM

## 2024-03-09 DIAGNOSIS — J9600 Acute respiratory failure, unspecified whether with hypoxia or hypercapnia: Secondary | ICD-10-CM

## 2024-03-09 DIAGNOSIS — Z992 Dependence on renal dialysis: Secondary | ICD-10-CM

## 2024-03-09 DIAGNOSIS — I509 Heart failure, unspecified: Secondary | ICD-10-CM

## 2024-03-09 DIAGNOSIS — N186 End stage renal disease: Secondary | ICD-10-CM

## 2024-03-09 DIAGNOSIS — M3214 Glomerular disease in systemic lupus erythematosus: Secondary | ICD-10-CM

## 2024-03-09 LAB — CBC WITH DIFFERENTIAL/PLATELET
Abs Immature Granulocytes: 0.03 K/uL (ref 0.00–0.07)
Basophils Absolute: 0 K/uL (ref 0.0–0.1)
Basophils Relative: 1 %
Eosinophils Absolute: 0.6 K/uL — ABNORMAL HIGH (ref 0.0–0.5)
Eosinophils Relative: 9 %
HCT: 30 % — ABNORMAL LOW (ref 36.0–46.0)
Hemoglobin: 9.2 g/dL — ABNORMAL LOW (ref 12.0–15.0)
Immature Granulocytes: 0 %
Lymphocytes Relative: 27 %
Lymphs Abs: 1.8 K/uL (ref 0.7–4.0)
MCH: 28.4 pg (ref 26.0–34.0)
MCHC: 30.7 g/dL (ref 30.0–36.0)
MCV: 92.6 fL (ref 80.0–100.0)
Monocytes Absolute: 1.1 K/uL — ABNORMAL HIGH (ref 0.1–1.0)
Monocytes Relative: 16 %
Neutro Abs: 3.3 K/uL (ref 1.7–7.7)
Neutrophils Relative %: 47 %
Platelets: 228 K/uL (ref 150–400)
RBC: 3.24 MIL/uL — ABNORMAL LOW (ref 3.87–5.11)
RDW: 17.6 % — ABNORMAL HIGH (ref 11.5–15.5)
WBC: 6.8 K/uL (ref 4.0–10.5)
nRBC: 0 % (ref 0.0–0.2)

## 2024-03-09 LAB — BASIC METABOLIC PANEL WITH GFR
Anion gap: 17 — ABNORMAL HIGH (ref 5–15)
BUN: 45 mg/dL — ABNORMAL HIGH (ref 6–20)
CO2: 23 mmol/L (ref 22–32)
Calcium: 9.1 mg/dL (ref 8.9–10.3)
Chloride: 92 mmol/L — ABNORMAL LOW (ref 98–111)
Creatinine, Ser: 8.69 mg/dL — ABNORMAL HIGH (ref 0.44–1.00)
GFR, Estimated: 6 mL/min — ABNORMAL LOW (ref >60)
Glucose, Bld: 79 mg/dL (ref 70–99)
Potassium: 4.6 mmol/L (ref 3.5–5.1)
Sodium: 132 mmol/L — ABNORMAL LOW (ref 135–145)

## 2024-03-09 LAB — GLUCOSE, CAPILLARY
Glucose-Capillary: 222 mg/dL — ABNORMAL HIGH (ref 70–99)
Glucose-Capillary: 36 mg/dL — CL (ref 70–99)
Glucose-Capillary: 45 mg/dL — ABNORMAL LOW (ref 70–99)
Glucose-Capillary: 55 mg/dL — ABNORMAL LOW (ref 70–99)
Glucose-Capillary: 59 mg/dL — ABNORMAL LOW (ref 70–99)
Glucose-Capillary: 63 mg/dL — ABNORMAL LOW (ref 70–99)
Glucose-Capillary: 68 mg/dL — ABNORMAL LOW (ref 70–99)
Glucose-Capillary: 80 mg/dL (ref 70–99)
Glucose-Capillary: 84 mg/dL (ref 70–99)
Glucose-Capillary: 93 mg/dL (ref 70–99)
Glucose-Capillary: 93 mg/dL (ref 70–99)

## 2024-03-09 LAB — STREP PNEUMONIAE URINARY ANTIGEN: Strep Pneumo Urinary Antigen: NEGATIVE

## 2024-03-09 MED ORDER — DEXTROSE 50 % IV SOLN: 25.0000 g | INTRAVENOUS | Status: DC

## 2024-03-09 MED ORDER — DEXTROSE 10 % IV SOLN: INTRAVENOUS | Status: AC

## 2024-03-09 MED ORDER — CHLORHEXIDINE GLUCONATE CLOTH 2 % EX PADS
6.0000 | MEDICATED_PAD | Freq: Every day | CUTANEOUS | Status: DC
  Administered 2024-03-10 – 2024-03-11 (×2): 6 via TOPICAL

## 2024-03-09 MED ORDER — ALLOPURINOL 100 MG PO TABS
100.0000 mg | ORAL_TABLET | Freq: Every day | ORAL | Status: DC
  Administered 2024-03-09 – 2024-03-11 (×3): 100 mg via ORAL
  Filled 2024-03-09 (×3): qty 1

## 2024-03-09 MED ORDER — GLUCOSE 40 % PO GEL: 2.0000 | ORAL | Status: AC

## 2024-03-09 MED ORDER — DEXTROSE 50 % IV SOLN
INTRAVENOUS | Status: AC
  Administered 2024-03-09: 50 mL
  Filled 2024-03-09: qty 50

## 2024-03-09 MED ORDER — OXYCODONE HCL 5 MG PO TABS
10.0000 mg | ORAL_TABLET | Freq: Once | ORAL | Status: AC
  Administered 2024-03-09: 10 mg via ORAL
  Filled 2024-03-09: qty 2

## 2024-03-09 MED ORDER — HYDROMORPHONE HCL 1 MG/ML IJ SOLN
0.5000 mg | Freq: Once | INTRAMUSCULAR | Status: AC | PRN
  Administered 2024-03-09: 0.5 mg via INTRAVENOUS
  Filled 2024-03-09: qty 0.5

## 2024-03-09 MED ORDER — LOPERAMIDE HCL 2 MG PO CAPS
4.0000 mg | ORAL_CAPSULE | Freq: Once | ORAL | Status: AC
  Administered 2024-03-09: 4 mg via ORAL
  Filled 2024-03-09: qty 2

## 2024-03-09 MED ORDER — HYDROMORPHONE HCL 2 MG PO TABS
1.0000 mg | ORAL_TABLET | Freq: Once | ORAL | Status: AC
  Administered 2024-03-09: 1 mg via ORAL
  Filled 2024-03-09: qty 1

## 2024-03-09 NOTE — Progress Notes (Signed)
 Hypoglycemic Event  CBG: 55  Treatment: 4 oz juice/soda  Symptoms: None, Patient ate all her lunch   Follow-up CBG: Time:12:26 CBG Result:36 Dextrose  50% given  CBG: 222  Possible Reasons for Event: Inadequate meal intake  Comments/MD notified:Yes MD Elgergawy     Paloma Grange K Bunnie Lederman

## 2024-03-09 NOTE — H&P (View-Only) (Signed)
 Hospital Consult    Reason for Consult:  fistulogram Requesting Physician:  Ronnald Jake RIGGERS MRN #:  [id number]  History of Present Illness: This is a 28 y.o. female with CHF, HTN, Lupus nephritis and ESRD on HD MWF via a left AV fistula who was admitted with volume overload and in respiratory distress. Vascular surgery was consulted for evaluation of her left AV fistula. She has had her fistula since July of 2020. It was initially created by Dr. Marea. She reports that it has been working well until just a couple weeks ago. She says that it has been pulling clots and  clotting off towards the end of her HD sessions. She reports that she has been able to complete her sessions and denies any bleeding. She does not report any signs or symptoms of steal. She was able to have HD session tomorrow without any issues.   Past Medical History:  Diagnosis Date   Abnormal ECG    a. In setting of LVH w/ repolarization abnormalities; b. 03/2019 MV: No ischemia/infarct. EF 55-65%.   Angioedema    ESRD on hemodialysis (HCC)    GERD (gastroesophageal reflux disease)    Hypertension    Lupus    LVH (left ventricular hypertrophy)    a. 11/2017 Echo: EF 50-55%. triv AI. Mild MR. Mod L and mild R atrial enlargement. Nl RV size/fxn. Small pericardial effusion w/o tampondae; b. 03/2018 Echo: EF 55-60%, restrictive filling pattern. Nl RV size/fxn. Sev LAE. Mild MR, mild to mod TR/PR. Mod PAH. Triv effusion.   Migraines    Mixed connective tissue disease (HCC)    Previous Dialysis patient [facility])    a. Off HD since late 2020.   Septic prepatellar bursitis of right knee 08/19/2017    Past Surgical History:  Procedure Laterality Date   GRAFT APPLICATION Left 03/30/2019   Procedure: BRACHIAL CEPHALIC GRAFT CREATION;  Surgeon: Marea Selinda RAMAN, MD;  Location: [facility] ORS;  Service: Vascular;  Laterality: Left;   I & D EXTREMITY Right 08/06/2017   Procedure: IRRIGATION AND DEBRIDEMENT KNEE;  Surgeon: Kendal Franky SQUIBB, MD;   Location: [facility] OR;  Service: Orthopedics;  Laterality: Right;   IR THROMBECTOMY AV FISTULA W/THROMBOLYSIS/PTA/STENT INC/SHUNT/IMG LT Left 05/18/2022   IR US  GUIDE VASC ACCESS LEFT  05/18/2022   MULTIPLE TOOTH EXTRACTIONS      Allergies  Allergen Reactions   Iodine  Hives and Shortness Of Breath   Lisinopril  Anaphylaxis    Angioedema   Gabapentin Other (See Comments)    Reaction: Tremor (intolerance); tremor   Ativan  [Lorazepam ] Other (See Comments)    Somnolent with 1mg  ativan  at [facility] Nov 2019. Patient stated it made her feel like she shut down and was mute and non-responding.   Hydroxychloroquine  Other (See Comments)    Pt reports making her skin peel really bad   Vicodin [Hydrocodone -Acetaminophen ] Itching and Rash    Prior to Admission medications   Medication Sig Start Date End Date Taking? Authorizing Provider  allopurinol  (ZYLOPRIM ) 100 MG tablet Take 1 tablet (100 mg total) by mouth daily. 02/13/24  Yes Elgergawy, Brayton RAMAN, MD  amLODipine  (NORVASC ) 10 MG tablet Take 20 mg by mouth at bedtime. 08/05/21  Yes [provider]  carvedilol  (COREG ) 25 MG tablet Take 25 mg by mouth 2 (two) times daily with a meal.   Yes [provider]  furosemide  (LASIX ) 80 MG tablet Take 1 tablet (80 mg total) by mouth daily. Patient taking differently: Take 160 mg by mouth 2 (  two) times daily. 12/23/23  Yes Fairy Frames, MD  irbesartan  (AVAPRO ) 300 MG tablet Take 1 tablet (300 mg total) by mouth daily. 02/03/24  Yes Walker, Caitlin S, NP  lidocaine -prilocaine  (EMLA ) cream Apply 1 Application topically daily as needed (for dialysis). 01/14/24  Yes [provider]  medroxyPROGESTERone  (DEPO-PROVERA ) 150 MG/ML injection Inject 1 mL (150 mg total) into the muscle every 3 (three) months. 10/29/22  Yes Rudy Carlin LABOR, MD  nitroGLYCERIN  (NITROSTAT ) 0.4 MG SL tablet Place 1 tablet (0.4 mg total) under the tongue every 5 (five) minutes as needed for chest pain. 10/28/23  Yes Rai,  Ripudeep K, MD  ondansetron  (ZOFRAN -ODT) 4 MG disintegrating tablet Take 1 tablet (4 mg total) by mouth every 8 (eight) hours as needed for nausea or vomiting. Patient taking differently: Take 4 mg by mouth daily as needed for nausea or vomiting. 10/28/23  Yes Rai, Ripudeep K, MD  oxyCODONE  (OXY IR/ROXICODONE ) 5 MG immediate release tablet Take 1 tablet (5 mg total) by mouth every 4 (four) hours as needed for moderate pain (pain score 4-6) or breakthrough pain. 02/12/24  Yes Elgergawy, Brayton RAMAN, MD  sevelamer  carbonate (RENVELA ) 2.4 g PACK Take 2.4 g by mouth daily. 01/21/24  Yes [provider]  tiZANidine  (ZANAFLEX ) 4 MG tablet Take 4 mg by mouth daily as needed for muscle spasms. 12/03/23  Yes [provider]  isosorbide  mononitrate (IMDUR ) 30 MG 24 hr tablet Take 30 mg by mouth daily. Patient not taking: Reported on 03/08/2024    [provider]    Social History   Socioeconomic History   Marital status: Single    Spouse name: Not on file   Number of children: 0   Years of education: Not on file   Highest education level: Some college, no degree  Occupational History   Not on file  Tobacco Use   Smoking status: Every Day    Current packs/day: 0.25    Average packs/day: 0.3 packs/day for 9.1 years (2.3 ttl pk-yrs)    Types: E-cigarettes, Cigarettes    Start date: 02/11/2015   Smokeless tobacco: Never   Tobacco comments:    02/03/2024 Patient smokes 3 cigarettes daily  Vaping Use   Vaping status: Every Day  Substance and Sexual Activity   Alcohol use: No    Alcohol/week: 0.0 standard drinks of alcohol   Drug use: No   Sexual activity: Yes    Partners: Male    Birth control/protection: None  Other Topics Concern   Not on file  Social History Narrative   Lives with boyfriend   Social Drivers of Health   Financial Resource Strain: Low Risk  (02/03/2024)   Overall Financial Resource Strain (CARDIA)    Difficulty of Paying Living Expenses: Not hard at all   Food Insecurity: No Food Insecurity (03/08/2024)   Hunger Vital Sign    Worried About Running Out of Food in the Last Year: Never true    Ran Out of Food in the Last Year: Never true  Transportation Needs: No Transportation Needs (03/08/2024)   PRAPARE - Administrator, Civil Service (Medical): No    Lack of Transportation (Non-Medical): No  Recent Concern: Transportation Needs - Unmet Transportation Needs (02/09/2024)   PRAPARE - Administrator, Civil Service (Medical): Yes    Lack of Transportation (Non-Medical): No  Physical Activity: Insufficiently Active (02/03/2024)   Exercise Vital Sign    Days of Exercise per Week: 2 days  Minutes of Exercise per Session: 10 min  Stress: No Stress Concern Present (02/03/2024)   [employer] of Occupational Health - Occupational Stress Questionnaire    Feeling of Stress : Not at all  Social Connections: Socially Isolated (02/03/2024)   Social Connection and Isolation Panel    Frequency of Communication with Friends and Family: Never    Frequency of Social Gatherings with Friends and Family: Once a week    Attends Religious Services: Never    Database administrator or Organizations: Yes    Attends Banker Meetings: Never    Marital Status: Never married  Intimate Partner Violence: Not At Risk (03/08/2024)   Humiliation, Afraid, Rape, and Kick questionnaire    Fear of Current or Ex-Partner: No    Emotionally Abused: No    Physically Abused: No    Sexually Abused: No     Family History  Problem Relation Age of Onset   Lupus Mother    Hypertension Mother    Kidney disease Mother    Heart Problems Mother    Coronary artery disease Mother 3       stent   Diabetes Maternal Grandmother     ROS: Otherwise negative unless mentioned in HPI  Physical Examination  Vitals:   03/09/24 0738 03/09/24 1126  BP: (!) 161/114 (!) 154/118  Pulse: 69 79  Resp:    Temp: 97.6 F (36.4 C) 98.5 F (36.9 C)  SpO2:      Body mass index is 20.42 kg/m.  General:  WDWN in NAD Gait: Not observed HENT: WNL, normocephalic Pulmonary: normal non-labored breathing, without wheezing Cardiac: regular, without  Murmurs Vascular Exam/Pulses: 2+ radial pulses, left AV fistula with great thrill Extremities: without ischemic changes, without Gangrene , without cellulitis; without open wounds;  Musculoskeletal: no muscle wasting or atrophy  Neurologic: A&O X 3;  No focal weakness or paresthesias are detected; speech is fluent/normal Psychiatric:  The pt has Normal affect. Lymph:  Unremarkable  CBC    Component Value Date/Time   WBC 6.8 03/09/2024 0607   RBC 3.24 (L) 03/09/2024 0607   HGB 9.2 (L) 03/09/2024 0607   HCT 30.0 (L) 03/09/2024 0607   PLT 228 03/09/2024 0607   MCV 92.6 03/09/2024 0607   MCH 28.4 03/09/2024 0607   MCHC 30.7 03/09/2024 0607   RDW 17.6 (H) 03/09/2024 0607   LYMPHSABS 1.8 03/09/2024 0607   MONOABS 1.1 (H) 03/09/2024 0607   EOSABS 0.6 (H) 03/09/2024 0607   BASOSABS 0.0 03/09/2024 0607    BMET    Component Value Date/Time   NA 132 (L) 03/09/2024 0607   NA 139 06/08/2017 1145   K 4.6 03/09/2024 0607   CL 92 (L) 03/09/2024 0607   CO2 23 03/09/2024 0607   GLUCOSE 79 03/09/2024 0607   BUN 45 (H) 03/09/2024 0607   BUN 12 06/08/2017 1145   CREATININE 8.69 (H) 03/09/2024 0607   CALCIUM  9.1 03/09/2024 0607   GFRNONAA 6 (L) 03/09/2024 0607   GFRAA 19 (L) 03/27/2019 1510    COAGS: Lab Results  Component Value Date   INR 1.2 10/10/2023   INR 1.0 03/27/2019   INR 1.01 01/25/2018     Non-Invasive Vascular Imaging:   none  Statin:  No. Beta Blocker:  Yes.   Aspirin:  No. ACEI:  No. ARB:  Yes.   CCB use:  Yes Other antiplatelets/anticoagulants:  No.    ASSESSMENT/PLAN: This is a 28 y.o. female with ESRD MWF via a left  brachiocephalic AV fistula. Over the past several weeks she reports issues with clotting from her fistula. She has been very anxious about getting her  fistula evaluated. She has had bad experience in the past with outpatient fistulogram. She expresses her desire to have general anesthesia for it. Explained to her the significantly increased risk of general anesthesia for a procedure like this especially under the circumstances of her admission and being ESRD. We have offered her a fistulogram in the cath lab with local anesthetic. Should she wish to not proceed she will let us  know. This possibly can be arranged for tomorrow vs early next week. This certainly can also be arranged as an outpatient in the near future. The on call vascular surgeon, Dr. Gretta will see patient and provide details regarding timing of her fistulogram.   Teretha Damme PA-C [facility] [phone] 03/09/2024  12:42 PM  I have seen and evaluated the patient. I agree with the PA note as documented above.  28 year old female with ESRD utilizing a left upper arm AV graft.  Vascular surgery has been consulted for fistulogram.  She states they have been pulling clots from the graft for the last several weeks.  On exam the left arm graft has an excellent thrill and does not really feel like there are technical issues with the graft.  I discussed that we could certainly do a fistulogram to further evaluate.  She is requesting general anesthesia which I discussed is not our standard and would not be appropriate given her admission for heart failure exacerbation and volume overload with missed dialysis.  I have offered a try and add her on tomorrow for a left arm fistulogram in the Cath Lab.  The schedule is quite busy so there will be some chance she gets canceled but we will try.  N.p.o. at midnight.  Lonni DOROTHA Gretta, MD [facility] [place] Office: [phone]

## 2024-03-09 NOTE — Progress Notes (Signed)
 [facility] night cross cover note:   I was notified by the patient's RN that the patient is complaining of pain, noting that oxycodone  has typically been infective, with the patient requesting iv Dilaudid  for pain control.  I subsequently ordered Dilaudid  0.5 mg IV x 1 dose.     Eva Pore, DO Hospitalist

## 2024-03-09 NOTE — Progress Notes (Signed)
 PROGRESS NOTE    Linda [name]  FMW:[id number] DOB: 08/25/1996 DOA: 03/06/2024 PCP: [name], NP   Chief Complaint  Patient presents with   Abdominal Pain    Brief Narrative:   28 year old female with history of SLE and ESRD on HD who presented with abdominal discomfort to the hospital, with the development of respiratory failure in the ED requiring intubation and mechanical ventilation. She was extubated yesterday.   Assessment & Plan:   Principal Problem:   Acute hypoxemic respiratory failure (HCC) Active Problems:   SLE (systemic lupus erythematosus related syndrome) (HCC)   Aspiration pneumonia (HCC)   HFrEF (heart failure with reduced ejection fraction) (HCC)   Acute hypoxic respiratory failure 2/2 volume overload with pulm congestion in setting of missed HD sessions  Intubated and sedated 7/7 >> extubated 7/8, place on BiPAP > transitioned to [facility] Repeat CXR 7/8 with improved pulm edema -Wean oxygen as tolerated -Encouraged use incentive spirometry and flutter valve, down to 4 L oxygen    ESRD on MWF HD Hx missed HD sessions, multiple admits due to volume overload S/p HD x2, plan for next dialysis tomorrow to be back on schedule   Chronic pain -  on oxycodone  at home  Hypertensive emergency with significant evidence of volume overload. -HTN at baseline, on amlodipine , irbesartan , carvedilol , and lasix  at home -Severe HTN on admission with volume overload and pulm edema, s/p nitroglycerin  drip due to SBP in 190s. Systolic pressures continue to be stable at goal  PRN labetalol , goal SBP 150-170     Acute on chronic diastolic/systolic CHF Nonischemic cardiomyopathy secondary to hypertension -Echo 03/06/24 with EF 30-35% (dec from prior 45% in Jan 23), hypokinetic LV, mod dilated LV, mild LVH, normal RV function, mod dilated LA and RA, mild MR, mod TR, normal AV Follows with cardiology outpt, GDMT limited by ESRD -Per cardiology: Volume management  with hemodialysis -Appreciate ongoing cardiology recommendations -caution with fluids   -Abnormal EKG on admit Repeat EKG 7/8 with corrected QT 453 Trops peaked, most likely 2/2 decompensated heart failure --cardiac monitoring     Possible HCAP Reported abd pain/diarrhea on admission, no evidence of this during admit Started on vanc, zosyn  in ED. Vanc d/c'd given neg MRSA swab. Procalcitonin significantly elevated to 19 > 20, 2/2 ?ESRD vs occult infection Blood cultures no growth x 2 days -continue Zosyn  -f/u resp cx, legionella antigen, strep pneumo antigen   Hypoglycemia -Unclear if she has true hypoglycemia from her digits, but she will be n.p.o. this evening for possible fistulogram tomorrow so we will start on D10 W at midnight    RHEUMATOLOGY #SLE Has [facility] rheum in the past, has appt 7/22. Last seen 2023 Not on immunosuppression currently. Has used thalidomide for discoid lupus, plaqeunil (d/c'd due to allergic rxn) --recommend outpatient f/u     MSK/DERM #Gluteal skin lesions s/p punch biopsy June 2025 with lipoma (unlikely calciphylaxis), family concern --CTM, appears stable at this time   Hx GERD, ? PUD Reports epigastric pain today, used to take Protonix  for GERD. --daily protonix  40mg  -- Recommend outpatient GI follow-up   DVT prophylaxis: Heparin  Code Status: Full Family Communication: none at bedside Disposition:   Status is: Inpatient    Consultants:  PCCM Renal Vascular surgery   Subjective:  No significant events overnight, she denies any complaints today, pain is controlled, her fingersticks were on the lower side, she received D50.  Objective: Vitals:   03/09/24 0354 03/09/24 0738 03/09/24 1126 03/09/24  1323  BP: (!) 126/90 (!) 161/114 (!) 154/118   Pulse: 75 69 79   Resp: 19     Temp: 98.7 F (37.1 C) 97.6 F (36.4 C) 98.5 F (36.9 C)   TempSrc: Oral Axillary Oral   SpO2: 99%     Weight:    74.8 kg  Height:       No  intake or output data in the 24 hours ending 03/09/24 1412 Filed Weights   03/07/24 1412 03/07/24 1809 03/09/24 1323  Weight: 60.9 kg 57.4 kg 74.8 kg    Examination:  Awake Alert, Oriented X 3, No new F.N deficits, Normal affect Symmetrical Chest wall movement, Good air movement bilaterally, CTAB RRR,No Gallops,Rubs or new Murmurs, No Parasternal Heave +ve B.Sounds, Abd Soft, No tenderness, No rebound - guarding or rigidity. No Cyanosis, Clubbing or edema, No new Rash or bruise      Data Reviewed: I have personally reviewed following labs and imaging studies  CBC: Recent Labs  Lab 03/06/24 0417 03/06/24 0815 03/06/24 0845 03/06/24 1101 03/07/24 0544 03/08/24 0608 03/09/24 0607  WBC 7.5 13.7*  --   --  8.3 6.2 6.8  NEUTROABS 4.0  --   --   --   --  3.0 3.3  HGB 9.3* 10.4* 12.2 11.9* 9.9* 9.8* 9.2*  HCT 31.4* 34.8* 36.0 35.0* 31.4* 31.9* 30.0*  MCV 94.3 93.8  --   --  90.0 93.0 92.6  PLT 206 243  --   --  232 204 228    Basic Metabolic Panel: Recent Labs  Lab 03/06/24 0417 03/06/24 0815 03/06/24 0845 03/06/24 1101 03/07/24 0544 03/07/24 1026 03/08/24 0608 03/09/24 0607  NA 137  --    < > 136 128* 135 131* 132*  K 5.2*  --    < > 5.4* 4.5 4.7 4.5 4.6  CL 99  --   --   --  89* 92* 92* 92*  CO2 22  --   --   --  24 25 23 23   GLUCOSE 79  --   --   --  53* 89 68* 79  BUN 49*  --   --   --  27* 29* 20 45*  CREATININE 9.36* 9.76*  --   --  5.59* 6.50* 5.09* 8.69*  CALCIUM  9.4  --   --   --  8.9 9.0 8.8* 9.1   < > = values in this interval not displayed.    GFR: Estimated Creatinine Clearance: 10 mL/min (A) (by C-G formula based on SCr of 8.69 mg/dL (H)).  Liver Function Tests: Recent Labs  Lab 03/06/24 0417 03/07/24 0544  AST 17 21  ALT 11 10  ALKPHOS 85 66  BILITOT 0.6 1.8*  PROT 7.5 7.3  ALBUMIN  3.1* 2.9*    CBG: Recent Labs  Lab 03/09/24 0804 03/09/24 1125 03/09/24 1226 03/09/24 1256 03/09/24 1308  GLUCAP 93 55* 36* 45* 222*     Recent  Results (from the past 240 hours)  Culture, blood (Routine X 2) w Reflex to ID Panel     Status: None (Preliminary result)   Collection Time: 03/06/24 10:08 AM   Specimen: BLOOD RIGHT ARM  Result Value Ref Range Status   Specimen Description BLOOD RIGHT ARM  Final   Special Requests   Final    BOTTLES DRAWN AEROBIC AND ANAEROBIC Blood Culture results may not be optimal due to an inadequate volume of blood received in culture bottles   Culture   Final  NO GROWTH 3 DAYS Performed at [facility] Lab, [address] [place], [place] [address]    Report Status PENDING  Incomplete  Respiratory (~20 pathogens) panel by PCR     Status: None   Collection Time: 03/06/24 10:09 AM   Specimen: Anterior Nasal Swab; Respiratory  Result Value Ref Range Status   Adenovirus NOT DETECTED NOT DETECTED Final   Coronavirus 229E NOT DETECTED NOT DETECTED Final    Comment: (NOTE) The Coronavirus on the Respiratory Panel, DOES NOT test for the novel  Coronavirus (2019 nCoV)    Coronavirus HKU1 NOT DETECTED NOT DETECTED Final   Coronavirus NL63 NOT DETECTED NOT DETECTED Final   Coronavirus OC43 NOT DETECTED NOT DETECTED Final   Metapneumovirus NOT DETECTED NOT DETECTED Final   Rhinovirus / Enterovirus NOT DETECTED NOT DETECTED Final   Influenza A NOT DETECTED NOT DETECTED Final   Influenza B NOT DETECTED NOT DETECTED Final   Parainfluenza Virus 1 NOT DETECTED NOT DETECTED Final   Parainfluenza Virus 2 NOT DETECTED NOT DETECTED Final   Parainfluenza Virus 3 NOT DETECTED NOT DETECTED Final   Parainfluenza Virus 4 NOT DETECTED NOT DETECTED Final   Respiratory Syncytial Virus NOT DETECTED NOT DETECTED Final   Bordetella pertussis NOT DETECTED NOT DETECTED Final   Bordetella Parapertussis NOT DETECTED NOT DETECTED Final   Chlamydophila pneumoniae NOT DETECTED NOT DETECTED Final   Mycoplasma pneumoniae NOT DETECTED NOT DETECTED Final    Comment: Performed at [facility], [address],  [place], [place] [address]  Resp panel by RT-PCR (RSV, Flu A&B, Covid) Anterior Nasal Swab     Status: None   Collection Time: 03/06/24 10:09 AM   Specimen: Anterior Nasal Swab  Result Value Ref Range Status   SARS Coronavirus 2 by RT PCR NEGATIVE NEGATIVE Final   Influenza A by PCR NEGATIVE NEGATIVE Final   Influenza B by PCR NEGATIVE NEGATIVE Final    Comment: (NOTE) The Xpert Xpress SARS-CoV-2/FLU/RSV plus assay is intended as an aid in the diagnosis of influenza from Nasopharyngeal swab specimens and should not be used as a sole basis for treatment. Nasal washings and aspirates are unacceptable for Xpert Xpress SARS-CoV-2/FLU/RSV testing.  Fact Sheet for Patients: BloggerCourse.com  Fact Sheet for Healthcare Providers: SeriousBroker.it  This test is not yet approved or cleared by the [place]  FDA and has been authorized for detection and/or diagnosis of SARS-CoV-2 by FDA under an Emergency Use Authorization (EUA). This EUA will remain in effect (meaning this test can be used) for the duration of the COVID-19 declaration under Section 564(b)(1) of the Act, 21 U.S.C. section 360bbb-3(b)(1), unless the authorization is terminated or revoked.     Resp Syncytial Virus by PCR NEGATIVE NEGATIVE Final    Comment: (NOTE) Fact Sheet for Patients: BloggerCourse.com  Fact Sheet for Healthcare Providers: SeriousBroker.it  This test is not yet approved or cleared by the [place]  FDA and has been authorized for detection and/or diagnosis of SARS-CoV-2 by FDA under an Emergency Use Authorization (EUA). This EUA will remain in effect (meaning this test can be used) for the duration of the COVID-19 declaration under Section 564(b)(1) of the Act, 21 U.S.C. section 360bbb-3(b)(1), unless the authorization is terminated or revoked.  Performed at [facility], [address]  [place], [place] [address]   Culture, blood (Routine X 2) w Reflex to ID Panel     Status: None (Preliminary result)   Collection Time: 03/06/24 10:13 AM   Specimen:  BLOOD RIGHT ARM  Result Value Ref Range Status   Specimen Description BLOOD RIGHT ARM  Final   Special Requests   Final    BOTTLES DRAWN AEROBIC AND ANAEROBIC Blood Culture results may not be optimal due to an inadequate volume of blood received in culture bottles   Culture   Final    NO GROWTH 3 DAYS Performed at [facility], [address], [place], [place] [address]    Report Status PENDING  Incomplete  MRSA Next Gen by PCR, Nasal     Status: None   Collection Time: 03/06/24 11:47 AM  Result Value Ref Range Status   MRSA by PCR Next Gen NOT DETECTED NOT DETECTED Final    Comment: (NOTE) The GeneXpert MRSA Assay (FDA approved for NASAL specimens only), is one component of a comprehensive MRSA colonization surveillance program. It is not intended to diagnose MRSA infection nor to guide or monitor treatment for MRSA infections. Test performance is not FDA approved in patients less than 47 years old. Performed at [facility] Lab, [address] [place], [place] [address]          Radiology Studies: No results found.      Scheduled Meds:  acetaminophen   650 mg Oral Q6H   allopurinol   100 mg Oral Daily   amLODipine   10 mg Oral QHS   carvedilol   3.125 mg Oral BID WC   Chlorhexidine  Gluconate Cloth  6 each Topical Q0600   [START ON 03/10/2024] Chlorhexidine  Gluconate Cloth  6 each Topical Q0600   dextrose   2 Tube Oral STAT   heparin  injection (subcutaneous)  5,000 Units Subcutaneous Q8H   insulin  aspart  0-6 Units Subcutaneous Q4H   irbesartan   300 mg Oral Daily   mouth rinse  15 mL Mouth Rinse BID   pantoprazole   40 mg Oral Daily   Continuous Infusions:  piperacillin -tazobactam (ZOSYN )  IV 2.25 g (03/09/24 1015)     LOS: 3 days     Brayton Lye, MD [facility]   To contact the  attending provider between 7A-7P or the covering provider during after hours 7P-7A, please log into the web site [email or web address] and access using universal [place] password for that web site. If you do not have the password, please call the hospital operator.  03/09/2024, 2:12 PM

## 2024-03-09 NOTE — Progress Notes (Signed)
 [facility] Progress Note  Subjective:  Seen in room, no new c/o's  Vitals:   03/09/24 0212 03/09/24 0354 03/09/24 0738 03/09/24 1126  BP: (!) 125/97 (!) 126/90 (!) 161/114 (!) 154/118  Pulse:  75 69 79  Resp:  19    Temp:  98.7 F (37.1 C) 97.6 F (36.4 C) 98.5 F (36.9 C)  TempSrc:  Oral Axillary Oral  SpO2:  99%    Weight:      Height:        Exam: Gen on [place] O2 No jvd or bruits Chest cta bilat  RRR no MRG, tachy Abd soft ntnd no mass or ascites +bs Ext no pitting edema Neuro as above    LUE AVG+bruit     OP HD: AF MWF 3.5h   B400  69kg   AVG LUE  Hep none    Assessment/ Plan: Resp distress/ vol overload: w/ pulm edema by CXR and on exam. BP severely high initially. Intubated in ED by CCM. Had HD 7/07 and 7/08 in ICU. No sig bp drop during either HD. She is under dry wt 12 kg, could be all due to lean body wt loss. Looks much better overall. On 4 L [place] now. Will get f/u CXR.  ESRD: on HD MWF. Had HD 2days in a row. Next HD Friday to get back on schedule.  HD access: says she needs a fistulogram, consider VVS consult Uncont HTN: BPs improved, cont po meds per CCM Volume: get stand wt today Anemia of esrd: Hb 9-12 here, follow.     Myer Fret MD  [facility] 03/09/2024, 12:00 PM  Recent Labs  Lab 03/06/24 0417 03/06/24 0815 03/07/24 0544 03/07/24 1026 03/08/24 0608 03/09/24 0607  HGB 9.3*   < > 9.9*  --  9.8* 9.2*  ALBUMIN  3.1*  --  2.9*  --   --   --   CALCIUM  9.4  --  8.9   < > 8.8* 9.1  CREATININE 9.36*   < > 5.59*   < > 5.09* 8.69*  K 5.2*   < > 4.5   < > 4.5 4.6   < > = values in this interval not displayed.   No results for input(s): IRON , TIBC, FERRITIN in the last 168 hours. Inpatient medications:  acetaminophen   650 mg Oral Q6H   allopurinol   100 mg Oral Daily   amLODipine   10 mg Oral QHS   carvedilol   3.125 mg Oral BID WC   Chlorhexidine  Gluconate Cloth  6 each Topical Q0600   Chlorhexidine  Gluconate Cloth  6 each Topical  Q0600   heparin  injection (subcutaneous)  5,000 Units Subcutaneous Q8H   insulin  aspart  0-6 Units Subcutaneous Q4H   irbesartan   300 mg Oral Daily   mouth rinse  15 mL Mouth Rinse BID   pantoprazole   40 mg Oral Daily    piperacillin -tazobactam (ZOSYN )  IV 2.25 g (03/09/24 1015)   docusate sodium , labetalol , menthol -cetylpyridinium, mouth rinse, oxyCODONE , polyethylene glycol, tiZANidine 

## 2024-03-09 NOTE — Plan of Care (Signed)
  Problem: Fluid Volume: Goal: Ability to maintain a balanced intake and output will improve Outcome: Progressing   Problem: Health Behavior/Discharge Planning: Goal: Ability to manage health-related needs will improve Outcome: Progressing   Problem: Metabolic: Goal: Ability to maintain appropriate glucose levels will improve Outcome: Progressing   Problem: Skin Integrity: Goal: Risk for impaired skin integrity will decrease Outcome: Progressing   Problem: Education: Goal: Knowledge of General Education information will improve Description: Including pain rating scale, medication(s)/side effects and non-pharmacologic comfort measures Outcome: Progressing   Problem: Clinical Measurements: Goal: Will remain free from infection Outcome: Progressing Goal: Diagnostic test results will improve Outcome: Progressing   Problem: Activity: Goal: Risk for activity intolerance will decrease Outcome: Progressing   Problem: Coping: Goal: Level of anxiety will decrease Outcome: Progressing   Problem: Pain Managment: Goal: General experience of comfort will improve and/or be controlled Outcome: Progressing   Problem: Safety: Goal: Ability to remain free from injury will improve Outcome: Progressing

## 2024-03-09 NOTE — Consult Note (Addendum)
 Hospital Consult    Reason for Consult:  fistulogram Requesting Physician:  Ronnald Jake RIGGERS MRN #:  [id number]  History of Present Illness: This is a 28 y.o. female with CHF, HTN, Lupus nephritis and ESRD on HD MWF via a left AV fistula who was admitted with volume overload and in respiratory distress. Vascular surgery was consulted for evaluation of her left AV fistula. She has had her fistula since July of 2020. It was initially created by Dr. Marea. She reports that it has been working well until just a couple weeks ago. She says that it has been pulling clots and  clotting off towards the end of her HD sessions. She reports that she has been able to complete her sessions and denies any bleeding. She does not report any signs or symptoms of steal. She was able to have HD session tomorrow without any issues.   Past Medical History:  Diagnosis Date   Abnormal ECG    a. In setting of LVH w/ repolarization abnormalities; b. 03/2019 MV: No ischemia/infarct. EF 55-65%.   Angioedema    ESRD on hemodialysis (HCC)    GERD (gastroesophageal reflux disease)    Hypertension    Lupus    LVH (left ventricular hypertrophy)    a. 11/2017 Echo: EF 50-55%. triv AI. Mild MR. Mod L and mild R atrial enlargement. Nl RV size/fxn. Small pericardial effusion w/o tampondae; b. 03/2018 Echo: EF 55-60%, restrictive filling pattern. Nl RV size/fxn. Sev LAE. Mild MR, mild to mod TR/PR. Mod PAH. Triv effusion.   Migraines    Mixed connective tissue disease (HCC)    Previous Dialysis patient [facility])    a. Off HD since late 2020.   Septic prepatellar bursitis of right knee 08/19/2017    Past Surgical History:  Procedure Laterality Date   GRAFT APPLICATION Left 03/30/2019   Procedure: BRACHIAL CEPHALIC GRAFT CREATION;  Surgeon: Marea Selinda RAMAN, MD;  Location: [facility] ORS;  Service: Vascular;  Laterality: Left;   I & D EXTREMITY Right 08/06/2017   Procedure: IRRIGATION AND DEBRIDEMENT KNEE;  Surgeon: Kendal Franky SQUIBB, MD;   Location: [facility] OR;  Service: Orthopedics;  Laterality: Right;   IR THROMBECTOMY AV FISTULA W/THROMBOLYSIS/PTA/STENT INC/SHUNT/IMG LT Left 05/18/2022   IR US  GUIDE VASC ACCESS LEFT  05/18/2022   MULTIPLE TOOTH EXTRACTIONS      Allergies  Allergen Reactions   Iodine  Hives and Shortness Of Breath   Lisinopril  Anaphylaxis    Angioedema   Gabapentin Other (See Comments)    Reaction: Tremor (intolerance); tremor   Ativan  [Lorazepam ] Other (See Comments)    Somnolent with 1mg  ativan  at [facility] Nov 2019. Patient stated it made her feel like she shut down and was mute and non-responding.   Hydroxychloroquine  Other (See Comments)    Pt reports making her skin peel really bad   Vicodin [Hydrocodone -Acetaminophen ] Itching and Rash    Prior to Admission medications   Medication Sig Start Date End Date Taking? Authorizing Provider  allopurinol  (ZYLOPRIM ) 100 MG tablet Take 1 tablet (100 mg total) by mouth daily. 02/13/24  Yes Elgergawy, Brayton RAMAN, MD  amLODipine  (NORVASC ) 10 MG tablet Take 20 mg by mouth at bedtime. 08/05/21  Yes [provider]  carvedilol  (COREG ) 25 MG tablet Take 25 mg by mouth 2 (two) times daily with a meal.   Yes [provider]  furosemide  (LASIX ) 80 MG tablet Take 1 tablet (80 mg total) by mouth daily. Patient taking differently: Take 160 mg by mouth 2 (  two) times daily. 12/23/23  Yes Fairy Frames, MD  irbesartan  (AVAPRO ) 300 MG tablet Take 1 tablet (300 mg total) by mouth daily. 02/03/24  Yes Walker, Caitlin S, NP  lidocaine -prilocaine  (EMLA ) cream Apply 1 Application topically daily as needed (for dialysis). 01/14/24  Yes [provider]  medroxyPROGESTERone  (DEPO-PROVERA ) 150 MG/ML injection Inject 1 mL (150 mg total) into the muscle every 3 (three) months. 10/29/22  Yes Rudy Carlin LABOR, MD  nitroGLYCERIN  (NITROSTAT ) 0.4 MG SL tablet Place 1 tablet (0.4 mg total) under the tongue every 5 (five) minutes as needed for chest pain. 10/28/23  Yes Rai,  Ripudeep K, MD  ondansetron  (ZOFRAN -ODT) 4 MG disintegrating tablet Take 1 tablet (4 mg total) by mouth every 8 (eight) hours as needed for nausea or vomiting. Patient taking differently: Take 4 mg by mouth daily as needed for nausea or vomiting. 10/28/23  Yes Rai, Ripudeep K, MD  oxyCODONE  (OXY IR/ROXICODONE ) 5 MG immediate release tablet Take 1 tablet (5 mg total) by mouth every 4 (four) hours as needed for moderate pain (pain score 4-6) or breakthrough pain. 02/12/24  Yes Elgergawy, Brayton RAMAN, MD  sevelamer  carbonate (RENVELA ) 2.4 g PACK Take 2.4 g by mouth daily. 01/21/24  Yes [provider]  tiZANidine  (ZANAFLEX ) 4 MG tablet Take 4 mg by mouth daily as needed for muscle spasms. 12/03/23  Yes [provider]  isosorbide  mononitrate (IMDUR ) 30 MG 24 hr tablet Take 30 mg by mouth daily. Patient not taking: Reported on 03/08/2024    [provider]    Social History   Socioeconomic History   Marital status: Single    Spouse name: Not on file   Number of children: 0   Years of education: Not on file   Highest education level: Some college, no degree  Occupational History   Not on file  Tobacco Use   Smoking status: Every Day    Current packs/day: 0.25    Average packs/day: 0.3 packs/day for 9.1 years (2.3 ttl pk-yrs)    Types: E-cigarettes, Cigarettes    Start date: 02/11/2015   Smokeless tobacco: Never   Tobacco comments:    02/03/2024 Patient smokes 3 cigarettes daily  Vaping Use   Vaping status: Every Day  Substance and Sexual Activity   Alcohol use: No    Alcohol/week: 0.0 standard drinks of alcohol   Drug use: No   Sexual activity: Yes    Partners: Male    Birth control/protection: None  Other Topics Concern   Not on file  Social History Narrative   Lives with boyfriend   Social Drivers of Health   Financial Resource Strain: Low Risk  (02/03/2024)   Overall Financial Resource Strain (CARDIA)    Difficulty of Paying Living Expenses: Not hard at all   Food Insecurity: No Food Insecurity (03/08/2024)   Hunger Vital Sign    Worried About Running Out of Food in the Last Year: Never true    Ran Out of Food in the Last Year: Never true  Transportation Needs: No Transportation Needs (03/08/2024)   PRAPARE - Administrator, Civil Service (Medical): No    Lack of Transportation (Non-Medical): No  Recent Concern: Transportation Needs - Unmet Transportation Needs (02/09/2024)   PRAPARE - Administrator, Civil Service (Medical): Yes    Lack of Transportation (Non-Medical): No  Physical Activity: Insufficiently Active (02/03/2024)   Exercise Vital Sign    Days of Exercise per Week: 2 days  Minutes of Exercise per Session: 10 min  Stress: No Stress Concern Present (02/03/2024)   [employer] of Occupational Health - Occupational Stress Questionnaire    Feeling of Stress : Not at all  Social Connections: Socially Isolated (02/03/2024)   Social Connection and Isolation Panel    Frequency of Communication with Friends and Family: Never    Frequency of Social Gatherings with Friends and Family: Once a week    Attends Religious Services: Never    Database administrator or Organizations: Yes    Attends Banker Meetings: Never    Marital Status: Never married  Intimate Partner Violence: Not At Risk (03/08/2024)   Humiliation, Afraid, Rape, and Kick questionnaire    Fear of Current or Ex-Partner: No    Emotionally Abused: No    Physically Abused: No    Sexually Abused: No     Family History  Problem Relation Age of Onset   Lupus Mother    Hypertension Mother    Kidney disease Mother    Heart Problems Mother    Coronary artery disease Mother 66       stent   Diabetes Maternal Grandmother     ROS: Otherwise negative unless mentioned in HPI  Physical Examination  Vitals:   03/09/24 0738 03/09/24 1126  BP: (!) 161/114 (!) 154/118  Pulse: 69 79  Resp:    Temp: 97.6 F (36.4 C) 98.5 F (36.9 C)  SpO2:      Body mass index is 20.42 kg/m.  General:  WDWN in NAD Gait: Not observed HENT: WNL, normocephalic Pulmonary: normal non-labored breathing, without wheezing Cardiac: regular, without  Murmurs Vascular Exam/Pulses: 2+ radial pulses, left AV fistula with great thrill Extremities: without ischemic changes, without Gangrene , without cellulitis; without open wounds;  Musculoskeletal: no muscle wasting or atrophy  Neurologic: A&O X 3;  No focal weakness or paresthesias are detected; speech is fluent/normal Psychiatric:  The pt has Normal affect. Lymph:  Unremarkable  CBC    Component Value Date/Time   WBC 6.8 03/09/2024 0607   RBC 3.24 (L) 03/09/2024 0607   HGB 9.2 (L) 03/09/2024 0607   HCT 30.0 (L) 03/09/2024 0607   PLT 228 03/09/2024 0607   MCV 92.6 03/09/2024 0607   MCH 28.4 03/09/2024 0607   MCHC 30.7 03/09/2024 0607   RDW 17.6 (H) 03/09/2024 0607   LYMPHSABS 1.8 03/09/2024 0607   MONOABS 1.1 (H) 03/09/2024 0607   EOSABS 0.6 (H) 03/09/2024 0607   BASOSABS 0.0 03/09/2024 0607    BMET    Component Value Date/Time   NA 132 (L) 03/09/2024 0607   NA 139 06/08/2017 1145   K 4.6 03/09/2024 0607   CL 92 (L) 03/09/2024 0607   CO2 23 03/09/2024 0607   GLUCOSE 79 03/09/2024 0607   BUN 45 (H) 03/09/2024 0607   BUN 12 06/08/2017 1145   CREATININE 8.69 (H) 03/09/2024 0607   CALCIUM  9.1 03/09/2024 0607   GFRNONAA 6 (L) 03/09/2024 0607   GFRAA 19 (L) 03/27/2019 1510    COAGS: Lab Results  Component Value Date   INR 1.2 10/10/2023   INR 1.0 03/27/2019   INR 1.01 01/25/2018     Non-Invasive Vascular Imaging:   none  Statin:  No. Beta Blocker:  Yes.   Aspirin:  No. ACEI:  No. ARB:  Yes.   CCB use:  Yes Other antiplatelets/anticoagulants:  No.    ASSESSMENT/PLAN: This is a 28 y.o. female with ESRD MWF via a left  brachiocephalic AV fistula. Over the past several weeks she reports issues with clotting from her fistula. She has been very anxious about getting her  fistula evaluated. She has had bad experience in the past with outpatient fistulogram. She expresses her desire to have general anesthesia for it. Explained to her the significantly increased risk of general anesthesia for a procedure like this especially under the circumstances of her admission and being ESRD. We have offered her a fistulogram in the cath lab with local anesthetic. Should she wish to not proceed she will let us  know. This possibly can be arranged for tomorrow vs early next week. This certainly can also be arranged as an outpatient in the near future. The on call vascular surgeon, Dr. Gretta will see patient and provide details regarding timing of her fistulogram.   Teretha Damme PA-C [facility] [phone] 03/09/2024  12:42 PM  I have seen and evaluated the patient. I agree with the PA note as documented above.  28 year old female with ESRD utilizing a left upper arm AV graft.  Vascular surgery has been consulted for fistulogram.  She states they have been pulling clots from the graft for the last several weeks.  On exam the left arm graft has an excellent thrill and does not really feel like there are technical issues with the graft.  I discussed that we could certainly do a fistulogram to further evaluate.  She is requesting general anesthesia which I discussed is not our standard and would not be appropriate given her admission for heart failure exacerbation and volume overload with missed dialysis.  I have offered a try and add her on tomorrow for a left arm fistulogram in the Cath Lab.  The schedule is quite busy so there will be some chance she gets canceled but we will try.  N.p.o. at midnight.  Lonni DOROTHA Gretta, MD [facility] [place] Office: [phone]

## 2024-03-10 ENCOUNTER — Encounter (HOSPITAL_COMMUNITY)
Admission: EM | Disposition: A | Discharge status: Home / Self Care | Payer: Self-pay | Source: Home / Self Care | Attending: Internal Medicine

## 2024-03-10 ENCOUNTER — Ambulatory Visit (HOSPITAL_COMMUNITY): Admission: RE | Admit: 2024-03-10 | Source: Home / Self Care | Admitting: Surgery

## 2024-03-10 DIAGNOSIS — J9601 Acute respiratory failure with hypoxia: Secondary | ICD-10-CM | POA: Diagnosis not present

## 2024-03-10 DIAGNOSIS — T82590A Other mechanical complication of surgically created arteriovenous fistula, initial encounter: Secondary | ICD-10-CM

## 2024-03-10 HISTORY — PX: A/V FISTULAGRAM: CATH118298

## 2024-03-10 LAB — RENAL FUNCTION PANEL
Albumin: 2.7 g/dL — ABNORMAL LOW (ref 3.5–5.0)
Anion gap: 22 — ABNORMAL HIGH (ref 5–15)
BUN: 57 mg/dL — ABNORMAL HIGH (ref 6–20)
CO2: 19 mmol/L — ABNORMAL LOW (ref 22–32)
Calcium: 9.6 mg/dL (ref 8.9–10.3)
Chloride: 88 mmol/L — ABNORMAL LOW (ref 98–111)
Creatinine, Ser: 10.84 mg/dL — ABNORMAL HIGH (ref 0.44–1.00)
GFR, Estimated: 5 mL/min — ABNORMAL LOW (ref >60)
Glucose, Bld: 76 mg/dL (ref 70–99)
Phosphorus: 8.6 mg/dL — ABNORMAL HIGH (ref 2.5–4.6)
Potassium: 4.5 mmol/L (ref 3.5–5.1)
Sodium: 129 mmol/L — ABNORMAL LOW (ref 135–145)

## 2024-03-10 LAB — CBC WITH DIFFERENTIAL/PLATELET
Abs Immature Granulocytes: 0.07 K/uL (ref 0.00–0.07)
Basophils Absolute: 0 K/uL (ref 0.0–0.1)
Basophils Relative: 1 %
Eosinophils Absolute: 0.4 K/uL (ref 0.0–0.5)
Eosinophils Relative: 7 %
HCT: 28.5 % — ABNORMAL LOW (ref 36.0–46.0)
Hemoglobin: 9 g/dL — ABNORMAL LOW (ref 12.0–15.0)
Immature Granulocytes: 1 %
Lymphocytes Relative: 26 %
Lymphs Abs: 1.7 K/uL (ref 0.7–4.0)
MCH: 28.5 pg (ref 26.0–34.0)
MCHC: 31.6 g/dL (ref 30.0–36.0)
MCV: 90.2 fL (ref 80.0–100.0)
Monocytes Absolute: 1 K/uL (ref 0.1–1.0)
Monocytes Relative: 14 %
Neutro Abs: 3.4 K/uL (ref 1.7–7.7)
Neutrophils Relative %: 51 %
Platelets: 210 K/uL (ref 150–400)
RBC: 3.16 MIL/uL — ABNORMAL LOW (ref 3.87–5.11)
RDW: 17.4 % — ABNORMAL HIGH (ref 11.5–15.5)
WBC: 6.6 K/uL (ref 4.0–10.5)
nRBC: 0 % (ref 0.0–0.2)

## 2024-03-10 LAB — GLUCOSE, CAPILLARY
Glucose-Capillary: 88 mg/dL (ref 70–99)
Glucose-Capillary: 32 mg/dL — CL (ref 70–99)
Glucose-Capillary: 78 mg/dL (ref 70–99)
Glucose-Capillary: 104 mg/dL — ABNORMAL HIGH (ref 70–99)
Glucose-Capillary: 179 mg/dL — ABNORMAL HIGH (ref 70–99)
Glucose-Capillary: 105 mg/dL — ABNORMAL HIGH (ref 70–99)

## 2024-03-10 LAB — LEGIONELLA PNEUMOPHILA SEROGP 1 UR AG: L. pneumophila Serogp 1 Ur Ag: NEGATIVE

## 2024-03-10 LAB — TRIGLYCERIDES: Triglycerides: 155 mg/dL — ABNORMAL HIGH (ref <150)

## 2024-03-10 SURGERY — A/V FISTULAGRAM
Anesthesia: LOCAL

## 2024-03-10 MED ORDER — PENTAFLUOROPROP-TETRAFLUOROETH EX AERO: 1.0000 | INHALATION_SPRAY | CUTANEOUS | Status: DC | PRN

## 2024-03-10 MED ORDER — IODIXANOL 320 MG/ML IV SOLN
INTRAVENOUS | Status: DC | PRN
  Administered 2024-03-10: 15 mL

## 2024-03-10 MED ORDER — MIDAZOLAM HCL 2 MG/2ML IJ SOLN
INTRAMUSCULAR | Status: AC
  Filled 2024-03-10: qty 2

## 2024-03-10 MED ORDER — METHYLPREDNISOLONE SODIUM SUCC 125 MG IJ SOLR
125.0000 mg | Freq: Once | INTRAMUSCULAR | Status: AC
  Administered 2024-03-10: 125 mg via INTRAVENOUS

## 2024-03-10 MED ORDER — HEPARIN (PORCINE) IN NACL 1000-0.9 UT/500ML-% IV SOLN
INTRAVENOUS | Status: DC | PRN
  Administered 2024-03-10: 500 mL

## 2024-03-10 MED ORDER — MIDAZOLAM HCL 2 MG/2ML IJ SOLN
INTRAMUSCULAR | Status: DC | PRN
  Administered 2024-03-10: 2 mg via INTRAVENOUS

## 2024-03-10 MED ORDER — OXYCODONE HCL 5 MG PO TABS
10.0000 mg | ORAL_TABLET | Freq: Four times a day (QID) | ORAL | Status: DC | PRN
  Administered 2024-03-11 (×2): 10 mg via ORAL
  Filled 2024-03-10 (×2): qty 2

## 2024-03-10 MED ORDER — FENTANYL CITRATE PF 50 MCG/ML IJ SOSY
50.0000 ug | PREFILLED_SYRINGE | Freq: Once | INTRAMUSCULAR | Status: AC
  Administered 2024-03-10: 50 ug via INTRAVENOUS

## 2024-03-10 MED ORDER — DIPHENHYDRAMINE HCL 50 MG/ML IJ SOLN
25.0000 mg | Freq: Once | INTRAMUSCULAR | Status: AC
  Administered 2024-03-10: 25 mg via INTRAVENOUS

## 2024-03-10 MED ORDER — ALTEPLASE 2 MG IJ SOLR: 2.0000 mg | Freq: Once | INTRAMUSCULAR | Status: DC | PRN

## 2024-03-10 MED ORDER — FUROSEMIDE 40 MG PO TABS
160.0000 mg | ORAL_TABLET | Freq: Two times a day (BID) | ORAL | Status: DC
  Administered 2024-03-10 – 2024-03-11 (×2): 160 mg via ORAL
  Filled 2024-03-10 (×2): qty 4

## 2024-03-10 MED ORDER — FENTANYL CITRATE (PF) 100 MCG/2ML IJ SOLN
INTRAMUSCULAR | Status: DC | PRN
  Administered 2024-03-10: 50 ug via INTRAVENOUS

## 2024-03-10 MED ORDER — LIDOCAINE-PRILOCAINE 2.5-2.5 % EX CREA
1.0000 | TOPICAL_CREAM | CUTANEOUS | Status: DC | PRN
Start: 2024-03-10 — End: 2024-03-10

## 2024-03-10 MED ORDER — FENTANYL CITRATE (PF) 100 MCG/2ML IJ SOLN
INTRAMUSCULAR | Status: AC
  Filled 2024-03-10: qty 2

## 2024-03-10 MED ORDER — LIDOCAINE HCL (PF) 1 % IJ SOLN
INTRAMUSCULAR | Status: AC
  Filled 2024-03-10: qty 30

## 2024-03-10 MED ORDER — FENTANYL CITRATE PF 50 MCG/ML IJ SOSY
PREFILLED_SYRINGE | INTRAMUSCULAR | Status: AC
  Filled 2024-03-10: qty 1

## 2024-03-10 MED ORDER — HEPARIN SODIUM (PORCINE) 1000 UNIT/ML DIALYSIS
1000.0000 [IU] | INTRAMUSCULAR | Status: DC | PRN
Start: 2024-03-10 — End: 2024-03-10

## 2024-03-10 MED ORDER — HYDROMORPHONE HCL 1 MG/ML IJ SOLN
0.5000 mg | INTRAMUSCULAR | Status: DC | PRN
  Administered 2024-03-10: 0.5 mg via INTRAVENOUS
  Filled 2024-03-10: qty 0.5

## 2024-03-10 MED ORDER — LIDOCAINE HCL (PF) 1 % IJ SOLN: 5.0000 mL | INTRAMUSCULAR | Status: DC | PRN

## 2024-03-10 MED ORDER — ANTICOAGULANT SODIUM CITRATE 4% (200MG/5ML) IV SOLN: 5.0000 mL | Status: DC | PRN

## 2024-03-10 MED ORDER — CARVEDILOL 25 MG PO TABS
25.0000 mg | ORAL_TABLET | Freq: Two times a day (BID) | ORAL | Status: DC
  Administered 2024-03-10 – 2024-03-11 (×2): 25 mg via ORAL
  Filled 2024-03-10 (×2): qty 1

## 2024-03-10 MED ORDER — LIDOCAINE HCL (PF) 1 % IJ SOLN
INTRAMUSCULAR | Status: DC | PRN
  Administered 2024-03-10: 5 mL via INTRADERMAL

## 2024-03-10 SURGICAL SUPPLY — 8 items
BAG SNAP BAND KOVER 36X36 (MISCELLANEOUS) ×1 IMPLANT
COVER DOME SNAP 22 D (MISCELLANEOUS) ×1 IMPLANT
KIT MICROPUNCTURE NIT STIFF (SHEATH) IMPLANT
SHEATH PROBE COVER 6X72 (BAG) ×1 IMPLANT
STATION PROTECTION PRESSURIZED (MISCELLANEOUS) ×1 IMPLANT
STOPCOCK MORSE 400PSI 3WAY (MISCELLANEOUS) ×1 IMPLANT
TRAY PV CATH (CUSTOM PROCEDURE TRAY) ×1 IMPLANT
TUBING CIL FLEX 10 FLL-RA (TUBING) ×1 IMPLANT

## 2024-03-10 NOTE — Progress Notes (Signed)
 [facility] Progress Note  Subjective:  Seen in room Breathing better  Vitals:   03/10/24 0920 03/10/24 0925 03/10/24 0930 03/10/24 0935  BP: (!) 163/117 (!) 162/119 (!) 171/126 (!) 157/114  Pulse: 93 99 (!) 111 95  Resp: (!) 31 18 (!) 21 (!) 21  Temp:      TempSrc:      SpO2: 97% 98% 97% 100%  Weight:      Height:        Exam: Gen on  O2 No jvd or bruits Chest cta bilat  RRR no MRG, tachy Abd soft ntnd no mass or ascites +bs Ext no pitting edema Neuro as above    LUE AVG+bruit   Home bp medications: Norvac 10 mg  Coreg  25 bid Lasix  160mg  bid Avapro  300 every day   OP HD: AF MWF 3.5h   B400  69kg   AVG LUE  Hep none    Assessment/ Plan: Resp distress/ vol overload: w/ pulm edema by CXR. BP severely high initially. Intubated in ED by CCM. Had HD 7/07 and 7/08 in ICU. No sig bp drop during either HD. Looks much better overall. On 4 L  now, will ask for wean to RA. Suspect wean body wt loss is the main issue. Cont to lower vol w/ HD today.   ESRD: on HD MWF. Had HD 2days in a row. Next HD today.  HD access: fistulogram showed patent access, and patent stent. Appreciate VVS assistance.  Uncont HTN: BPs better but still high. Will ^coreg  to home dose 25 bid, resume her home po lasix  160 bid and cont avapro / norvasc  as is. Keep lowering volume to new dry wt.   Anemia of esrd: Hb 9-12 here, follow.     Myer Fret MD  [facility] 03/10/2024, 10:32 AM  Recent Labs  Lab 03/07/24 0544 03/07/24 1026 03/09/24 0607 03/10/24 0619 03/10/24 0649  HGB 9.9*   < > 9.2* 9.0*  --   ALBUMIN  2.9*  --   --   --  2.7*  CALCIUM  8.9   < > 9.1  --  9.6  PHOS  --   --   --   --  8.6*  CREATININE 5.59*   < > 8.69*  --  10.84*  K 4.5   < > 4.6  --  4.5   < > = values in this interval not displayed.   No results for input(s): IRON , TIBC, FERRITIN in the last 168 hours. Inpatient medications:  acetaminophen   650 mg Oral Q6H   allopurinol   100 mg Oral Daily    amLODipine   10 mg Oral QHS   carvedilol   3.125 mg Oral BID WC   Chlorhexidine  Gluconate Cloth  6 each Topical Q0600   Chlorhexidine  Gluconate Cloth  6 each Topical Q0600   dextrose   2 Tube Oral STAT   heparin  injection (subcutaneous)  5,000 Units Subcutaneous Q8H   insulin  aspart  0-6 Units Subcutaneous Q4H   irbesartan   300 mg Oral Daily   mouth rinse  15 mL Mouth Rinse BID   pantoprazole   40 mg Oral Daily    anticoagulant sodium citrate      dextrose  30 mL/hr at 03/10/24 0048   piperacillin -tazobactam (ZOSYN )  IV 2.25 g (03/10/24 0505)   alteplase , anticoagulant sodium citrate , docusate sodium , heparin , labetalol , lidocaine  (PF), lidocaine -prilocaine , menthol -cetylpyridinium, mouth rinse, oxyCODONE , pentafluoroprop-tetrafluoroeth, polyethylene glycol, tiZANidine 

## 2024-03-10 NOTE — Plan of Care (Signed)
  Problem: Metabolic: Goal: Ability to maintain appropriate glucose levels will improve Outcome: Progressing   Problem: Nutritional: Goal: Maintenance of adequate nutrition will improve Outcome: Progressing   Problem: Skin Integrity: Goal: Risk for impaired skin integrity will decrease Outcome: Progressing   Problem: Clinical Measurements: Goal: Ability to maintain clinical measurements within normal limits will improve Outcome: Progressing   Problem: Pain Managment: Goal: General experience of comfort will improve and/or be controlled Outcome: Progressing   Problem: Safety: Goal: Ability to remain free from injury will improve Outcome: Progressing

## 2024-03-10 NOTE — Procedures (Signed)
 Received patient in bed to unit.  Alert and oriented.  Informed consent signed and in chart.   TX duration: 3 hours  Patient tolerated well.  Transported back to the room  Alert, without acute distress.  Hand-off given to patient's nurse.   Access used: left arm fistula Access issues: none  Total UF removed: 3.5 liters Medication(s) given: fentanyl    Greig Silvan, RN [facility]

## 2024-03-10 NOTE — Progress Notes (Signed)
 PROGRESS NOTE    Linda [name]  FMW:[id number] DOB: 1995/09/10 DOA: 03/06/2024 PCP: [name], NP   Chief Complaint  Patient presents with   Abdominal Pain    Brief Narrative:   28 year old female with history of SLE and ESRD on HD who presented with abdominal discomfort to the hospital, with the development of respiratory failure in the ED requiring intubation and mechanical ventilation. She was extubated and transferred to progressive care.   Assessment & Plan:   Principal Problem:   Acute hypoxemic respiratory failure (HCC) Active Problems:   SLE (systemic lupus erythematosus related syndrome) (HCC)   Aspiration pneumonia (HCC)   HFrEF (heart failure with reduced ejection fraction) (HCC)   Acute hypoxic respiratory failure 2/2 volume overload with pulm congestion in setting of missed HD sessions  Intubated and sedated 7/7 >> extubated 7/8, place on BiPAP > transitioned to [facility] Repeat CXR 7/8 with improved pulm edema -Wean oxygen as tolerated -Encouraged use incentive spirometry and flutter valve, still on oxygen this morning.  Was encouraged use incentive spirometry, flutter valve, encouraged to ambulate.  ESRD on MWF HD Hx missed HD sessions, multiple admits due to volume overload S/p HD x2, plan for next dialysis tomorrow to be back on schedule - Patient's status post fistulogram by vascular surgery today, showing patent access and patent stent   Chronic pain -  on oxycodone  at home  Hypertensive emergency with significant evidence of volume overload. -HTN at baseline, on amlodipine , irbesartan , carvedilol , and lasix  at home -Severe HTN on admission with volume overload and pulm edema, s/p nitroglycerin  drip due to SBP in 190s. Systolic pressures continue to be stable at goal  PRN labetalol , goal SBP 150-170     Acute on chronic diastolic/systolic CHF Nonischemic cardiomyopathy secondary to hypertension -Echo 03/06/24 with EF 30-35% (dec from prior  45% in Jan 23), hypokinetic LV, mod dilated LV, mild LVH, normal RV function, mod dilated LA and RA, mild MR, mod TR, normal AV Follows with cardiology outpt, GDMT limited by ESRD -Per cardiology: Volume management with hemodialysis -Appreciate ongoing cardiology recommendations -caution with fluids   -Abnormal EKG on admit Repeat EKG 7/8 with corrected QT 453 Trops peaked, most likely 2/2 decompensated heart failure --cardiac monitoring     Possible HCAP Reported abd pain/diarrhea on admission, no evidence of this during admit Started on vanc, zosyn  in ED. Vanc d/c'd given neg MRSA swab. Procalcitonin significantly elevated to 19 > 20, 2/2 ?ESRD vs occult infection Blood cultures no growth x 2 days -continue Zosyn  -f/u resp cx, legionella antigen, strep pneumo antigen   Hypoglycemia - Patient with hypoglycemia readings, but not accurate as once checked from earlobe it does appear her CBG readings are higher, but she was kept on D10W overnight given n.p.o. for procedure today.      RHEUMATOLOGY #SLE Has seen [facility] rheum in the past, has appt 7/22. Last seen 2023 Not on immunosuppression currently. Has used thalidomide for discoid lupus, plaqeunil (d/c'd due to allergic rxn) --recommend outpatient f/u, I have discussed with her to keep this appointment.     MSK/DERM #Gluteal skin lesions s/p punch biopsy June 2025 with lipoma (unlikely calciphylaxis), family concern --CTM, appears stable at this time   Hx GERD, ? PUD Reports epigastric pain today, used to take Protonix  for GERD. --daily protonix  40mg  -- Recommend outpatient GI follow-up   DVT prophylaxis: Heparin  Code Status: Full Family Communication: none at bedside Disposition:   Status is: Inpatient    Consultants:  PCCM Renal Vascular surgery   Subjective:  No significant events overnight, port breathing is better, but she is anxious today (she was seen before her procedure she was anxious about her  fistulogram)  Objective: Vitals:   03/10/24 0925 03/10/24 0930 03/10/24 0935 03/10/24 1154  BP: (!) 162/119 (!) 171/126 (!) 157/114 (!) 169/124  Pulse: 99 (!) 111 95 96  Resp: 18 (!) 21 (!) 21   Temp:    98.1 F (36.7 C)  TempSrc:    Oral  SpO2: 98% 97% 100%   Weight:      Height:        Intake/Output Summary (Last 24 hours) at 03/10/2024 1324 Last data filed at 03/10/2024 0000 Gross per 24 hour  Intake 360 ml  Output --  Net 360 ml   Filed Weights   03/07/24 1412 03/07/24 1809 03/09/24 1323  Weight: 60.9 kg 57.4 kg 74.8 kg    Examination:  Awake Alert, Oriented X 3, anxious Symmetrical Chest wall movement, Good air movement bilaterally,  +ve B.Sounds, Abd Soft No Cyanosis, Clubbing or edema, No new Rash or bruise     Data Reviewed: I have personally reviewed following labs and imaging studies  CBC: Recent Labs  Lab 03/06/24 0417 03/06/24 0815 03/06/24 0845 03/06/24 1101 03/07/24 0544 03/08/24 0608 03/09/24 0607 03/10/24 0619  WBC 7.5 13.7*  --   --  8.3 6.2 6.8 6.6  NEUTROABS 4.0  --   --   --   --  3.0 3.3 3.4  HGB 9.3* 10.4*   < > 11.9* 9.9* 9.8* 9.2* 9.0*  HCT 31.4* 34.8*   < > 35.0* 31.4* 31.9* 30.0* 28.5*  MCV 94.3 93.8  --   --  90.0 93.0 92.6 90.2  PLT 206 243  --   --  232 204 228 210   < > = values in this interval not displayed.    Basic Metabolic Panel: Recent Labs  Lab 03/07/24 0544 03/07/24 1026 03/08/24 0608 03/09/24 0607 03/10/24 0649  NA 128* 135 131* 132* 129*  K 4.5 4.7 4.5 4.6 4.5  CL 89* 92* 92* 92* 88*  CO2 24 25 23 23  19*  GLUCOSE 53* 89 68* 79 76  BUN 27* 29* 20 45* 57*  CREATININE 5.59* 6.50* 5.09* 8.69* 10.84*  CALCIUM  8.9 9.0 8.8* 9.1 9.6  PHOS  --   --   --   --  8.6*    GFR: Estimated Creatinine Clearance: 8 mL/min (A) (by C-G formula based on SCr of 10.84 mg/dL (H)).  Liver Function Tests: Recent Labs  Lab 03/06/24 0417 03/07/24 0544 03/10/24 0649  AST 17 21  --   ALT 11 10  --   ALKPHOS 85 66  --    BILITOT 0.6 1.8*  --   PROT 7.5 7.3  --   ALBUMIN  3.1* 2.9* 2.7*    CBG: Recent Labs  Lab 03/09/24 2347 03/10/24 0423 03/10/24 0803 03/10/24 0808 03/10/24 1153  GLUCAP 68* 88 32* 78 104*     Recent Results (from the past 240 hours)  Culture, blood (Routine X 2) w Reflex to ID Panel     Status: None (Preliminary result)   Collection Time: 03/06/24 10:08 AM   Specimen: BLOOD RIGHT ARM  Result Value Ref Range Status   Specimen Description BLOOD RIGHT ARM  Final   Special Requests   Final    BOTTLES DRAWN AEROBIC AND ANAEROBIC Blood Culture results may not be optimal due to an inadequate  volume of blood received in culture bottles   Culture   Final    NO GROWTH 4 DAYS Performed at [facility], [address] [place], [place] [address]    Report Status PENDING  Incomplete  Respiratory (~20 pathogens) panel by PCR     Status: None   Collection Time: 03/06/24 10:09 AM   Specimen: Anterior Nasal Swab; Respiratory  Result Value Ref Range Status   Adenovirus NOT DETECTED NOT DETECTED Final   Coronavirus 229E NOT DETECTED NOT DETECTED Final    Comment: (NOTE) The Coronavirus on the Respiratory Panel, DOES NOT test for the novel  Coronavirus (2019 nCoV)    Coronavirus HKU1 NOT DETECTED NOT DETECTED Final   Coronavirus NL63 NOT DETECTED NOT DETECTED Final   Coronavirus OC43 NOT DETECTED NOT DETECTED Final   Metapneumovirus NOT DETECTED NOT DETECTED Final   Rhinovirus / Enterovirus NOT DETECTED NOT DETECTED Final   Influenza A NOT DETECTED NOT DETECTED Final   Influenza B NOT DETECTED NOT DETECTED Final   Parainfluenza Virus 1 NOT DETECTED NOT DETECTED Final   Parainfluenza Virus 2 NOT DETECTED NOT DETECTED Final   Parainfluenza Virus 3 NOT DETECTED NOT DETECTED Final   Parainfluenza Virus 4 NOT DETECTED NOT DETECTED Final   Respiratory Syncytial Virus NOT DETECTED NOT DETECTED Final   Bordetella pertussis NOT DETECTED NOT DETECTED Final   Bordetella Parapertussis NOT  DETECTED NOT DETECTED Final   Chlamydophila pneumoniae NOT DETECTED NOT DETECTED Final   Mycoplasma pneumoniae NOT DETECTED NOT DETECTED Final    Comment: Performed at [facility], [address], [place], [place] [address]  Resp panel by RT-PCR (RSV, Flu A&B, Covid) Anterior Nasal Swab     Status: None   Collection Time: 03/06/24 10:09 AM   Specimen: Anterior Nasal Swab  Result Value Ref Range Status   SARS Coronavirus 2 by RT PCR NEGATIVE NEGATIVE Final   Influenza A by PCR NEGATIVE NEGATIVE Final   Influenza B by PCR NEGATIVE NEGATIVE Final    Comment: (NOTE) The Xpert Xpress SARS-CoV-2/FLU/RSV plus assay is intended as an aid in the diagnosis of influenza from Nasopharyngeal swab specimens and should not be used as a sole basis for treatment. Nasal washings and aspirates are unacceptable for Xpert Xpress SARS-CoV-2/FLU/RSV testing.  Fact Sheet for Patients: BloggerCourse.com  Fact Sheet for Healthcare Providers: SeriousBroker.it  This test is not yet approved or cleared by the [place]  FDA and has been authorized for detection and/or diagnosis of SARS-CoV-2 by FDA under an Emergency Use Authorization (EUA). This EUA will remain in effect (meaning this test can be used) for the duration of the COVID-19 declaration under Section 564(b)(1) of the Act, 21 U.S.C. section 360bbb-3(b)(1), unless the authorization is terminated or revoked.     Resp Syncytial Virus by PCR NEGATIVE NEGATIVE Final    Comment: (NOTE) Fact Sheet for Patients: BloggerCourse.com  Fact Sheet for Healthcare Providers: SeriousBroker.it  This test is not yet approved or cleared by the [place]  FDA and has been authorized for detection and/or diagnosis of SARS-CoV-2 by FDA under an Emergency Use Authorization (EUA). This EUA will remain in effect (meaning this test can be used) for the  duration of the COVID-19 declaration under Section 564(b)(1) of the Act, 21 U.S.C. section 360bbb-3(b)(1), unless the authorization is terminated or revoked.  Performed at [facility], [address] [place], [place] [address]   Culture, blood (Routine X 2) w Reflex to ID Panel  Status: None (Preliminary result)   Collection Time: 03/06/24 10:13 AM   Specimen: BLOOD RIGHT ARM  Result Value Ref Range Status   Specimen Description BLOOD RIGHT ARM  Final   Special Requests   Final    BOTTLES DRAWN AEROBIC AND ANAEROBIC Blood Culture results may not be optimal due to an inadequate volume of blood received in culture bottles   Culture   Final    NO GROWTH 4 DAYS Performed at [facility], [address] [place], [place] [address]    Report Status PENDING  Incomplete  MRSA Next Gen by PCR, Nasal     Status: None   Collection Time: 03/06/24 11:47 AM  Result Value Ref Range Status   MRSA by PCR Next Gen NOT DETECTED NOT DETECTED Final    Comment: (NOTE) The GeneXpert MRSA Assay (FDA approved for NASAL specimens only), is one component of a comprehensive MRSA colonization surveillance program. It is not intended to diagnose MRSA infection nor to guide or monitor treatment for MRSA infections. Test performance is not FDA approved in patients less than 73 years old. Performed at [facility], [address] [place], [place] [address]          Radiology Studies: PERIPHERAL VASCULAR CATHETERIZATION Result Date: 03/10/2024 Images from the original result were not included.   Patient name: Linda [name]         MRN: [id number]        DOB: [birth year]          Sex: female  03/10/2024 Pre-operative Diagnosis: ESRD Post-operative diagnosis:  Same Surgeon:  Malvina New Procedure Performed:             1.  U/s guided access left upper arm graft             2.  shuntogram             3.  Conscious sedation, 9 minutes    Indications:  28 year old with ESRD and  pulling clots. She is here for fistulogram  Procedure:  The patient was identified in the holding area and taken to room 8.  The patient was then placed supine on the table and prepped and draped in the usual sterile fashion.  A time out was called.  Conscious sedation was administered with the use of IV fentanyl  and Versed  under continuous physician and nurse monitoring.  Heart rate, blood pressure, and oxygen saturations were continuously monitored.  Total sedation time was 9 minutes ultrasound was used to evaluate the fistula.  The vein was patent and compressible.  A digital ultrasound image was acquired.  The fistula was then accessed under ultrasound guidance using a micropuncture needle.  An 018 wire was then asvanced without resistance and a micropuncture sheath was placed.  Contrast injections were then performed through the sheath.  Monocryl was used for sheath removal  Findings: Widely patent central venous system.  The arteriovenous anastomosis is widely patent.  There is a stent within the left upper arm graft which is widely patent as is the dialysis graft             Intervention:  none  Impression:             #1  Widely patent left upper arm dialysis graft without stenosis             #2  access is ready for use    V. Malvina New, M.D., [facility] [place] Office: [phone] Pager:  [phone]        Scheduled Meds:  acetaminophen   650 mg Oral Q6H   allopurinol   100 mg Oral Daily   amLODipine   10 mg Oral QHS   carvedilol   25 mg Oral BID WC   Chlorhexidine  Gluconate Cloth  6 each Topical Q0600   Chlorhexidine  Gluconate Cloth  6 each Topical Q0600   dextrose   2 Tube Oral STAT   furosemide   160 mg Oral BID   heparin  injection (subcutaneous)  5,000 Units Subcutaneous Q8H   insulin  aspart  0-6 Units Subcutaneous Q4H   irbesartan   300 mg Oral Daily   mouth rinse  15 mL Mouth Rinse BID   pantoprazole   40 mg Oral Daily   Continuous Infusions:   anticoagulant sodium citrate      piperacillin -tazobactam (ZOSYN )  IV 2.25 g (03/10/24 1043)     LOS: 4 days     Brayton Lye, MD [facility]   To contact the attending provider between 7A-7P or the covering provider during after hours 7P-7A, please log into the web site [email or web address] and access using universal [place] password for that web site. If you do not have the password, please call the hospital operator.  03/10/2024, 1:24 PM

## 2024-03-10 NOTE — Progress Notes (Signed)
 [facility] night cross cover note:   I was notified by the patient's RN that the patient is continuing to experience significant pain following dose of oxycodone  IR per existing order for oxycodone  IR 10 mg p.o. every 6 hours as needed.   I subsequently added prn iv Dilaudid  for refractory pain.      Eva Pore, DO Hospitalist

## 2024-03-10 NOTE — Op Note (Signed)
    Patient name: Linda [name] MRN: [id number] DOB: 11-23-1995 Sex: female  03/10/2024 Pre-operative Diagnosis: ESRD Post-operative diagnosis:  Same Surgeon:  Malvina New Procedure Performed:  1.  U/s guided access left upper arm graft  2.  shuntogram  3.  Conscious sedation, 9 minutes    Indications:  28 year old with ESRD and pulling clots. She is here for fistulogram  Procedure:  The patient was identified in the holding area and taken to room 8.  The patient was then placed supine on the table and prepped and draped in the usual sterile fashion.  A time out was called.  Conscious sedation was administered with the use of IV fentanyl  and Versed  under continuous physician and nurse monitoring.  Heart rate, blood pressure, and oxygen saturations were continuously monitored.  Total sedation time was 9 minutes ultrasound was used to evaluate the fistula.  The vein was patent and compressible.  A digital ultrasound image was acquired.  The fistula was then accessed under ultrasound guidance using a micropuncture needle.  An 018 wire was then asvanced without resistance and a micropuncture sheath was placed.  Contrast injections were then performed through the sheath.  Monocryl was used for sheath removal  Findings: Widely patent central venous system.  The arteriovenous anastomosis is widely patent.  There is a stent within the left upper arm graft which is widely patent as is the dialysis graft   Intervention:  none  Impression:  #1  Widely patent left upper arm dialysis graft without stenosis  #2  access is ready for use    V. Malvina New, M.D., [facility] [place] Office: [phone] Pager:  [phone]

## 2024-03-10 NOTE — Plan of Care (Signed)
 Pt has rested quietly throughout the night with no distress noted. Alert and oriented. On O2 2LNC. SR/ST on the monitor. Up to BR to void. Pt refused tylenol  throughout the night. Pt refused to take pain meds. MD notified and dose of dilaudid  IV given with relief noted. No other complaints voiced. NPO after MN for procedure today.     Problem: Education: Goal: Knowledge of General Education information will improve Description: Including pain rating scale, medication(s)/side effects and non-pharmacologic comfort measures Outcome: Progressing   Problem: Clinical Measurements: Goal: Respiratory complications will improve Outcome: Progressing Goal: Cardiovascular complication will be avoided Outcome: Progressing   Problem: Activity: Goal: Risk for activity intolerance will decrease Outcome: Progressing   Problem: Pain Managment: Goal: General experience of comfort will improve and/or be controlled Outcome: Progressing

## 2024-03-10 NOTE — Progress Notes (Incomplete)
Patient came back from

## 2024-03-10 NOTE — Interval H&P Note (Signed)
 History and Physical Interval Note:  03/10/2024 9:20 AM  Linda [name]  has presented today for surgery, with the diagnosis of instage renal.  The various methods of treatment have been discussed with the patient and family. After consideration of risks, benefits and other options for treatment, the patient has consented to  Procedure(s): A/V Fistulagram (N/A) as a surgical intervention.  The patient's history has been reviewed, patient examined, no change in status, stable for surgery.  I have reviewed the patient's chart and labs.  Questions were answered to the patient's satisfaction.     Malvina New

## 2024-03-11 ENCOUNTER — Other Ambulatory Visit (HOSPITAL_COMMUNITY): Payer: Self-pay

## 2024-03-11 DIAGNOSIS — J9601 Acute respiratory failure with hypoxia: Secondary | ICD-10-CM | POA: Diagnosis not present

## 2024-03-11 LAB — BASIC METABOLIC PANEL WITH GFR
Anion gap: 17 — ABNORMAL HIGH (ref 5–15)
BUN: 54 mg/dL — ABNORMAL HIGH (ref 6–20)
CO2: 22 mmol/L (ref 22–32)
Calcium: 10 mg/dL (ref 8.9–10.3)
Chloride: 92 mmol/L — ABNORMAL LOW (ref 98–111)
Creatinine, Ser: 8.36 mg/dL — ABNORMAL HIGH (ref 0.44–1.00)
GFR, Estimated: 6 mL/min — ABNORMAL LOW (ref >60)
Glucose, Bld: 116 mg/dL — ABNORMAL HIGH (ref 70–99)
Potassium: 4.5 mmol/L (ref 3.5–5.1)
Sodium: 131 mmol/L — ABNORMAL LOW (ref 135–145)

## 2024-03-11 LAB — CULTURE, BLOOD (ROUTINE X 2)
Culture: NO GROWTH
Culture: NO GROWTH

## 2024-03-11 LAB — CBC WITH DIFFERENTIAL/PLATELET
Abs Immature Granulocytes: 0.03 K/uL (ref 0.00–0.07)
Basophils Absolute: 0 K/uL (ref 0.0–0.1)
Basophils Relative: 1 %
Eosinophils Absolute: 0 K/uL (ref 0.0–0.5)
Eosinophils Relative: 0 %
HCT: 26.8 % — ABNORMAL LOW (ref 36.0–46.0)
Hemoglobin: 8.3 g/dL — ABNORMAL LOW (ref 12.0–15.0)
Immature Granulocytes: 1 %
Lymphocytes Relative: 18 %
Lymphs Abs: 1.2 K/uL (ref 0.7–4.0)
MCH: 27.8 pg (ref 26.0–34.0)
MCHC: 31 g/dL (ref 30.0–36.0)
MCV: 89.6 fL (ref 80.0–100.0)
Monocytes Absolute: 0.8 K/uL (ref 0.1–1.0)
Monocytes Relative: 13 %
Neutro Abs: 4.5 K/uL (ref 1.7–7.7)
Neutrophils Relative %: 67 %
Platelets: 207 K/uL (ref 150–400)
RBC: 2.99 MIL/uL — ABNORMAL LOW (ref 3.87–5.11)
RDW: 17.3 % — ABNORMAL HIGH (ref 11.5–15.5)
WBC: 6.6 K/uL (ref 4.0–10.5)
nRBC: 0 % (ref 0.0–0.2)

## 2024-03-11 LAB — GLUCOSE, CAPILLARY
Glucose-Capillary: 112 mg/dL — ABNORMAL HIGH (ref 70–99)
Glucose-Capillary: 119 mg/dL — ABNORMAL HIGH (ref 70–99)
Glucose-Capillary: 120 mg/dL — ABNORMAL HIGH (ref 70–99)
Glucose-Capillary: 122 mg/dL — ABNORMAL HIGH (ref 70–99)
Glucose-Capillary: 68 mg/dL — ABNORMAL LOW (ref 70–99)

## 2024-03-11 MED ORDER — BLOOD PRESSURE MONITOR MISC
0 refills | Status: DC
  Filled 2024-03-11: qty 1, 30d supply, fill #0

## 2024-03-11 MED ORDER — OXYCODONE HCL 5 MG PO TABS
5.0000 mg | ORAL_TABLET | ORAL | 0 refills | Status: AC | PRN
Start: 2024-03-11 — End: 2024-03-18
  Filled 2024-03-11: qty 20, 7d supply, fill #0

## 2024-03-11 MED ORDER — ALLOPURINOL 100 MG PO TABS
100.0000 mg | ORAL_TABLET | Freq: Every day | ORAL | 0 refills | Status: AC
  Filled 2024-03-11: qty 30, 30d supply, fill #0

## 2024-03-11 MED ORDER — ISOSORBIDE MONONITRATE ER 30 MG PO TB24
30.0000 mg | ORAL_TABLET | Freq: Every day | ORAL | Status: DC
  Administered 2024-03-11: 30 mg via ORAL
  Filled 2024-03-11: qty 1

## 2024-03-11 MED ORDER — CLONIDINE HCL 0.1 MG PO TABS
0.1000 mg | ORAL_TABLET | Freq: Every day | ORAL | 0 refills | Status: AC
  Filled 2024-03-11: qty 30, 30d supply, fill #0

## 2024-03-11 MED ORDER — ACETAMINOPHEN 325 MG PO TABS: 650.0000 mg | ORAL_TABLET | Freq: Four times a day (QID) | ORAL | Status: AC

## 2024-03-11 MED ORDER — BLOOD PRESSURE KIT: PACK | 0 refills | Status: DC

## 2024-03-11 NOTE — Progress Notes (Signed)
 [facility] night cross cover note:   I was notified by the patient's RN and the patient's pressure remains elevated, with systolic blood pressures remaining in the 160s to 170s mmHg, along with diastolic blood pressures greater than 100 mmHg.  Corresponding heart rates in the 80s to 90s.  No new symptoms reported at this time.  She is currently on Norvasc  10 mg p.o. nightly, Lasix  160 mg p.o. twice daily, irbesartan  300 mg p.o. daily, and Coreg  was increased last evening to 25 mg p.o. twice daily.  She has received a dose of her existing prn IV labetalol  this evening as well as as needed pain medications that include as needed IV Dilaudid  as well as as needed oxycodone  IR.   She has ESRD on HD, and, per chart review, is felt to be volume long, with anticipated additional improvement in blood pressure with ensuing plan to further address hypervolemic state, including additional dialysis as well as increased dose of po lasix  relative to her outpatient dosing.  It is noted that she underwent hemodialysis yesterday, 03/10/2024, with removal of 3.5 L at that time.  Currently on M,W,F schedule for her hemodialysis.  Per chart review, it appears that she was previously on Imdur  30 mg p.o. daily at home, although this medication was recently discontinued on an outpatient basis.  I subsequently resumed her prior Imdur  30 mg p.o. daily, with first dose now.     Eva Pore, DO Hospitalist

## 2024-03-11 NOTE — Progress Notes (Signed)
 [facility] Progress Note  Subjective:  Seen in room Breathing better  Vitals:   03/11/24 0206 03/11/24 0843 03/11/24 0907 03/11/24 0910  BP: (!) 173/125 (!) 143/100    Pulse:   75 80  Resp:      Temp:  97.7 F (36.5 C)    TempSrc:  Oral    SpO2:   100% 97%  Weight:      Height:        Exam: Gen on [place] O2 No jvd or bruits Chest cta bilat  RRR no MRG, tachy Abd soft ntnd no mass or ascites +bs Ext no pitting edema Neuro as above    LUE AVG+bruit   Home bp medications: Norvac 10 mg  Coreg  25 bid Lasix  160mg  bid Avapro  300 every day   OP HD: AF MWF 3.5h   B400  69kg   AVG LUE  Hep none    Assessment/ Plan: Resp distress/ vol overload: w/ pulm edema by CXR. BP severely high initially. Intubated in ED by CCM. Had HD here x 3 and is off O2. Counseled pt on ways to stop eating ice.  For dc today.  ESRD: on HD MWF. Had HD here x 3. Next HD at OP unit on Monday.  HD access: fistulogram showed patent access, and patent stent, no abnormalities. Appreciate VVS assistance.  Uncont HTN: Cont home bp meds and imdur .   Anemia of esrd: Hb 9-12 here, follow.     Myer Fret MD  [facility] 03/11/2024, 11:26 AM  Recent Labs  Lab 03/07/24 0544 03/07/24 1026 03/10/24 0619 03/10/24 0649 03/11/24 0946  HGB 9.9*   < > 9.0*  --  8.3*  ALBUMIN  2.9*  --   --  2.7*  --   CALCIUM  8.9   < >  --  9.6 10.0  PHOS  --   --   --  8.6*  --   CREATININE 5.59*   < >  --  10.84* 8.36*  K 4.5   < >  --  4.5 4.5   < > = values in this interval not displayed.   No results for input(s): IRON , TIBC, FERRITIN in the last 168 hours. Inpatient medications:  acetaminophen   650 mg Oral Q6H   allopurinol   100 mg Oral Daily   amLODipine   10 mg Oral QHS   carvedilol   25 mg Oral BID WC   Chlorhexidine  Gluconate Cloth  6 each Topical Q0600   Chlorhexidine  Gluconate Cloth  6 each Topical Q0600   furosemide   160 mg Oral BID   heparin  injection (subcutaneous)  5,000 Units Subcutaneous  Q8H   insulin  aspart  0-6 Units Subcutaneous Q4H   irbesartan   300 mg Oral Daily   isosorbide  mononitrate  30 mg Oral Daily   mouth rinse  15 mL Mouth Rinse BID   pantoprazole   40 mg Oral Daily     docusate sodium , labetalol , menthol -cetylpyridinium, mouth rinse, oxyCODONE , polyethylene glycol, tiZANidine 

## 2024-03-11 NOTE — Discharge Summary (Signed)
 Physician Discharge Summary  Linda [name] FMW:[id number] DOB: Jan 10, 1996 DOA: 03/06/2024  PCP: [name], NP  Admit date: 03/06/2024 Discharge date: 03/11/2024  Admitted From: (Home) Disposition:  (Home)  Recommendations for Outpatient Follow-up:  Follow up with PCP in 1-2 weeks Please obtain BMP/CBC in one week Patient was instructed to keep her appointment with dermatology on 7/22    Diet recommendation: renal diet with fluid restriction  Brief/Interim Summary: 28 year old female with history of SLE and ESRD on HD who presented with abdominal discomfort to the hospital, with the development of respiratory failure in the ED requiring intubation and mechanical ventilation. She was extubated and transferred to progressive care.  Her hypoxia has resolved, currently on room air, will be discharged home.     Acute hypoxic respiratory failure 2/2 volume overload with pulm edema in setting of missed HD sessions and uncontrolled blood pressure Intubated and sedated 7/7 >> extubated 7/8, place on BiPAP > transitioned to [facility] on progressive care, she received hemodialysis, received total of 3 sessions, this morning he is 97% on room air, ambulated on the hallway where she sustained 93% on room air, so she will be discharged home today, she was instructed to take her incentive spirometer, flutter valve and keep using it at home, counseled about fluid restrictions, stop eating ice, and compliant with her medication regarding high blood pressure.    ESRD on MWF HD Hx missed HD sessions, multiple admits due to volume overload -She received HD x 3 here, next HD outpatient on Monday - Patient's status post fistulogram by vascular surgery 7/11, which shows patent fistula and graft.  Appreciate VVS assistance.    Chronic pain -  on oxycodone  at home   Hypertensive emergency with significant evidence of volume overload. -HTN location at baseline, fluid amlodipine , irbesartan ,  carvedilol , and lasix  at home, blood pressure significantly uncontrolled, she reports significant headache hydralazine  and Imdur  were she would not take them, so she was started on low-dose clonidine , this can be uptitrated as an outpatient for optimal blood pressure control     Acute on chronic diastolic/systolic CHF Nonischemic cardiomyopathy secondary to hypertension -Echo 03/06/24 with EF 30-35% (dec from prior 45% in Jan 23), hypokinetic LV, mod dilated LV, mild LVH, normal RV function, mod dilated LA and RA, mild MR, mod TR, normal AV Follows with cardiology outpt, GDMT limited by ESRD -Per cardiology: Volume management with hemodialysis -Appreciate ongoing cardiology recommendations -caution with fluids   -Abnormal EKG on admit Repeat EKG 7/8 with corrected QT 453 Trops peaked, most likely 2/2 decompensated heart failure    SEPSIS Ruled OUT Pneumonia Ruled OUT - Was empirically on IV antibiotics due to concern for pneumonia, chest  x-ray findings related to pulmonary edema and volume overload. Reported abd pain/diarrhea on admission, no evidence of this during admit Started on vanc, zosyn  in ED. Vanc d/c'd given neg MRSA swab. Procalcitonin significantly elevated to 19 > 20, 2/2 ?ESRD vs occult infection Blood cultures no growth     Hypoglycemia - Patient with hypoglycemia readings, but not accurate as once checked from earlobe it does appear her CBG readings are higher, but she was kept on D10W while n.p.o. for fistulogram   RHEUMATOLOGY #SLE Has seen [facility] rheum in the past, has appt 7/22. Last seen 2023 Not on immunosuppression currently. Has used thalidomide for discoid lupus, plaqeunil (d/c'd due to allergic rxn) --recommend outpatient f/u, I have discussed with her to keep this appointment.     MSK/DERM #Gluteal  skin lesions s/p punch biopsy June 2025 with lipoma (unlikely calciphylaxis), family concern --CTM, appears stable at this time -She had significant  improvement with allopurinol , which makes gout tophus high in the differential   Hx GERD, ? PUD. --daily protonix  40mg        Discharge Diagnoses:  Principal Problem:   Acute hypoxemic respiratory failure (HCC) Active Problems:   SLE (systemic lupus erythematosus related syndrome) (HCC)   Aspiration pneumonia (HCC)   HFrEF (heart failure with reduced ejection fraction) [facility])    Discharge Instructions  Discharge Instructions     Diet - low sodium heart healthy   Complete by: As directed    Discharge instructions   Complete by: As directed    Follow with Primary MD [name], NP in 7 days   Get CBC, CMP,  checked  by Primary MD next visit.    Activity: As tolerated with Full fall precautions use walker/cane & assistance as needed   Disposition Home    Diet: Renal Diet with 1200 cc fluid restriction   On your next visit with your primary care physician please Get Medicines reviewed and adjusted.   Please request your Prim.MD to go over all Hospital Tests and Procedure/Radiological results at the follow up, please get all Hospital records sent to your Prim MD by signing hospital release before you go home.   If you experience worsening of your admission symptoms, develop shortness of breath, life threatening emergency, suicidal or homicidal thoughts you must seek medical attention immediately by calling 911 or calling your MD immediately  if symptoms less severe.  You Must read complete instructions/literature along with all the possible adverse reactions/side effects for all the Medicines you take and that have been prescribed to you. Take any new Medicines after you have completely understood and accpet all the possible adverse reactions/side effects.   Do not drive, operating heavy machinery, perform activities at heights, swimming or participation in water activities or provide baby sitting services if your were admitted for syncope or siezures until you  have seen by Primary MD or a Neurologist and advised to do so again.  Do not drive when taking Pain medications.    Do not take more than prescribed Pain, Sleep and Anxiety Medications  Special Instructions: If you have smoked or chewed Tobacco  in the last 2 yrs please stop smoking, stop any regular Alcohol  and or any Recreational drug use.  Wear Seat belts while driving.   Please note  You were cared for by a hospitalist during your hospital stay. If you have any questions about your discharge medications or the care you received while you were in the hospital after you are discharged, you can call the unit and asked to speak with the hospitalist on call if the hospitalist that took care of you is not available. Once you are discharged, your primary care physician will handle any further medical issues. Please note that NO REFILLS for any discharge medications will be authorized once you are discharged, as it is imperative that you return to your primary care physician (or establish a relationship with a primary care physician if you do not have one) for your aftercare needs so that they can reassess your need for medications and monitor your lab values.   Increase activity slowly   Complete by: As directed    No wound care   Complete by: As directed       Allergies as of 03/11/2024  Reactions   Iodine  Hives, Shortness Of Breath   Lisinopril  Anaphylaxis   Angioedema   Gabapentin Other (See Comments)   Reaction: Tremor (intolerance); tremor   Ativan  [lorazepam ] Other (See Comments)   Somnolent with 1mg  ativan  at [facility] Nov 2019. Patient stated it made her feel like she shut down and was mute and non-responding.   Hydroxychloroquine  Other (See Comments)   Pt reports making her skin peel really bad   Vicodin [hydrocodone -acetaminophen ] Itching, Rash        Medication List     STOP taking these medications    isosorbide  mononitrate 30 MG 24 hr tablet Commonly  known as: IMDUR        TAKE these medications    acetaminophen  325 MG tablet Commonly known as: TYLENOL  Take 2 tablets (650 mg total) by mouth every 6 (six) hours.   allopurinol  100 MG tablet Commonly known as: ZYLOPRIM  Take 1 tablet (100 mg total) by mouth daily.   amLODipine  10 MG tablet Commonly known as: NORVASC  Take 20 mg by mouth at bedtime.   carvedilol  25 MG tablet Commonly known as: COREG  Take 25 mg by mouth 2 (two) times daily with a meal.   cloNIDine  0.1 MG tablet Commonly known as: CATAPRES  Take 1 tablet (0.1 mg total) by mouth daily.   furosemide  80 MG tablet Commonly known as: LASIX  Take 1 tablet (80 mg total) by mouth daily. What changed:  how much to take when to take this   irbesartan  300 MG tablet Commonly known as: AVAPRO  Take 1 tablet (300 mg total) by mouth daily.   lidocaine -prilocaine  cream Commonly known as: EMLA  Apply 1 Application topically daily as needed (for dialysis).   medroxyPROGESTERone  150 MG/ML injection Commonly known as: DEPO-PROVERA  Inject 1 mL (150 mg total) into the muscle every 3 (three) months.   nitroGLYCERIN  0.4 MG SL tablet Commonly known as: NITROSTAT  Place 1 tablet (0.4 mg total) under the tongue every 5 (five) minutes as needed for chest pain.   ondansetron  4 MG disintegrating tablet Commonly known as: ZOFRAN -ODT Take 1 tablet (4 mg total) by mouth every 8 (eight) hours as needed for nausea or vomiting. What changed: when to take this   oxyCODONE  5 MG immediate release tablet Commonly known as: Oxy IR/ROXICODONE  Take 1 tablet (5 mg total) by mouth every 4 (four) hours as needed for up to 7 days for moderate pain (pain score 4-6) or breakthrough pain.   sevelamer  carbonate 2.4 g Pack Commonly known as: RENVELA  Take 2.4 g by mouth daily.   tiZANidine  4 MG tablet Commonly known as: ZANAFLEX  Take 4 mg by mouth daily as needed for muscle spasms.        Follow-up Information     [name], NP.    Specialty: Family Medicine Contact information: [address] [place] [address] [phone]                Allergies  Allergen Reactions   Iodine  Hives and Shortness Of Breath   Lisinopril  Anaphylaxis    Angioedema   Gabapentin Other (See Comments)    Reaction: Tremor (intolerance); tremor   Ativan  [Lorazepam ] Other (See Comments)    Somnolent with 1mg  ativan  at [facility] Nov 2019. Patient stated it made her feel like she shut down and was mute and non-responding.   Hydroxychloroquine  Other (See Comments)    Pt reports making her skin peel really bad   Vicodin [Hydrocodone -Acetaminophen ] Itching and Rash    Consultations: PCCM Renal  Vascular surgery   Procedures/Studies: PERIPHERAL VASCULAR CATHETERIZATION Result Date: 03/10/2024 Images from the original result were not included.   Patient name: Linda [name]         MRN: [id number]        DOB: Nov 01, 1995          Sex: female  03/10/2024 Pre-operative Diagnosis: ESRD Post-operative diagnosis:  Same Surgeon:  Malvina New Procedure Performed:             1.  U/s guided access left upper arm graft             2.  shuntogram             3.  Conscious sedation, 9 minutes    Indications:  28 year old with ESRD and pulling clots. She is here for fistulogram  Procedure:  The patient was identified in the holding area and taken to room 8.  The patient was then placed supine on the table and prepped and draped in the usual sterile fashion.  A time out was called.  Conscious sedation was administered with the use of IV fentanyl  and Versed  under continuous physician and nurse monitoring.  Heart rate, blood pressure, and oxygen saturations were continuously monitored.  Total sedation time was 9 minutes ultrasound was used to evaluate the fistula.  The vein was patent and compressible.  A digital ultrasound image was acquired.  The fistula was then accessed under ultrasound guidance using a micropuncture needle.  An 018  wire was then asvanced without resistance and a micropuncture sheath was placed.  Contrast injections were then performed through the sheath.  Monocryl was used for sheath removal  Findings: Widely patent central venous system.  The arteriovenous anastomosis is widely patent.  There is a stent within the left upper arm graft which is widely patent as is the dialysis graft             Intervention:  none  Impression:             #1  Widely patent left upper arm dialysis graft without stenosis             #2  access is ready for use    V. Malvina New, M.D., [facility] [place] Office: [phone] Pager:  [phone]   DG CHEST PORT 1 VIEW Result Date: 03/07/2024 CLINICAL DATA:  [id number] Respiratory failure with hypoxia (HCC) [id number] EXAM: PORTABLE CHEST - 1 VIEW COMPARISON:  Multiple, most recently March 06, 2024 FINDINGS: Similarly positioned endotracheal tube terminating in the lower trachea. Esophagogastric tube courses below the diaphragm with the distal tip not included in the field of view. Mild persistent interstitial edema with otherwise improvement in the pulmonary edema. Persistent retrocardiac airspace opacities. No new airspace consolidation or pneumothorax. Trace right pleural effusion. Moderate cardiomegaly. IMPRESSION: 1. Improving pulmonary edema with mild persistent interstitial edema. Retrocardiac airspace disease is unchanged. 2. Similar positioning of the support tubes, as described above. Electronically Signed   By: Rogelia Myers M.D.   On: 03/07/2024 08:47   DG Abd Portable 1V Result Date: 03/06/2024 CLINICAL DATA:  Orogastric tube placement. EXAM: PORTABLE ABDOMEN - 1 VIEW COMPARISON:  CT earlier today FINDINGS: Tip and side port of the enteric tube below the diaphragm in the stomach. Generalized paucity of bowel gas. No gaseous bowel distension. IMPRESSION: Tip and side port of the enteric tube below the diaphragm in the stomach. Electronically Signed   By:  Andrea Gasman  M.D.   On: 03/06/2024 15:24   ECHOCARDIOGRAM COMPLETE Result Date: 03/06/2024    ECHOCARDIOGRAM REPORT   Patient Name:   Cris Gibby Date of Exam: 03/06/2024 Medical Rec #:  [id number]             Height:       66.0 in Accession #:    [id number]            Weight:       165.3 lb Date of Birth:  04/25/1996             BSA:          1.844 m Patient Age:    27 years              BP:           135/108 mmHg Patient Gender: F                     HR:           80 bpm. Exam Location:  Inpatient Procedure: 2D Echo, Color Doppler and Cardiac Doppler (Both Spectral and Color            Flow Doppler were utilized during procedure). Indications:    Pericardial effusion  History:        Patient has prior history of Echocardiogram examinations, most                 recent 10/25/2023.  Sonographer:    Benard Stallion Referring Phys: DORETHIA CAVE IMPRESSIONS  1. Left ventricular ejection fraction, by estimation, is 30 to 35%. The left ventricle has moderately decreased function. The left ventricle demonstrates global hypokinesis. The left ventricular internal cavity size was moderately dilated. There is mild  left ventricular hypertrophy. Left ventricular diastolic parameters are indeterminate.  2. Right ventricular systolic function is normal. The right ventricular size is normal.  3. Left atrial size was moderately dilated.  4. Right atrial size was moderately dilated.  5. The mitral valve is abnormal. Mild mitral valve regurgitation. No evidence of mitral stenosis.  6. Tricuspid valve regurgitation is moderate.  7. The aortic valve is normal in structure. Aortic valve regurgitation is not visualized. No aortic stenosis is present.  8. The inferior vena cava is normal in size with greater than 50% respiratory variability, suggesting right atrial pressure of 3 mmHg. FINDINGS  Left Ventricle: Left ventricular ejection fraction, by estimation, is 30 to 35%. The left ventricle has moderately decreased  function. The left ventricle demonstrates global hypokinesis. Strain was performed and the global longitudinal strain is indeterminate. The left ventricular internal cavity size was moderately dilated. There is mild left ventricular hypertrophy. Left ventricular diastolic parameters are indeterminate. Right Ventricle: The right ventricular size is normal. No increase in right ventricular wall thickness. Right ventricular systolic function is normal. Left Atrium: Left atrial size was moderately dilated. Right Atrium: Right atrial size was moderately dilated. Pericardium: There is no evidence of pericardial effusion. Mitral Valve: The mitral valve is abnormal. There is mild thickening of the mitral valve leaflet(s). Mild mitral valve regurgitation. No evidence of mitral valve stenosis. Tricuspid Valve: The tricuspid valve is normal in structure. Tricuspid valve regurgitation is moderate . No evidence of tricuspid stenosis. Aortic Valve: The aortic valve is normal in structure. Aortic valve regurgitation is not visualized. No aortic stenosis is present. Aortic valve mean gradient measures 3.0 mmHg. Aortic valve peak gradient measures 4.8 mmHg. Aortic valve area, by VTI measures  2.74 cm. Pulmonic Valve: The pulmonic valve was normal in structure. Pulmonic valve regurgitation is mild. No evidence of pulmonic stenosis. Aorta: The aortic root is normal in size and structure. Venous: The inferior vena cava is normal in size with greater than 50% respiratory variability, suggesting right atrial pressure of 3 mmHg. IAS/Shunts: No atrial level shunt detected by color flow Doppler. Additional Comments: 3D was performed not requiring image post processing on an independent workstation and was indeterminate.  LEFT VENTRICLE PLAX 2D LVIDd:         4.80 cm      Diastology LVIDs:         4.00 cm      LV e' medial:    5.87 cm/s LV PW:         1.50 cm      LV E/e' medial:  17.0 LV IVS:        1.20 cm      LV e' lateral:   9.03 cm/s  LVOT diam:     2.20 cm      LV E/e' lateral: 11.1 LV SV:         56 LV SV Index:   30 LVOT Area:     3.80 cm  LV Volumes (MOD) LV vol d, MOD A2C: 142.0 ml LV vol d, MOD A4C: 115.0 ml LV vol s, MOD A2C: 101.0 ml LV vol s, MOD A4C: 76.8 ml LV SV MOD A2C:     41.0 ml LV SV MOD A4C:     115.0 ml LV SV MOD BP:      38.4 ml RIGHT VENTRICLE RV Basal diam:  4.50 cm RV Mid diam:    3.30 cm RV S prime:     8.70 cm/s TAPSE (M-mode): 1.1 cm LEFT ATRIUM              Index        RIGHT ATRIUM           Index LA Vol (A2C):   135.0 ml 73.19 ml/m  RA Area:     25.40 cm LA Vol (A4C):   91.3 ml  49.50 ml/m  RA Volume:   92.40 ml  50.10 ml/m LA Biplane Vol: 112.0 ml 60.72 ml/m  AORTIC VALVE AV Area (Vmax):    3.12 cm AV Area (Vmean):   2.98 cm AV Area (VTI):     2.74 cm AV Vmax:           110.00 cm/s AV Vmean:          75.600 cm/s AV VTI:            0.204 m AV Peak Grad:      4.8 mmHg AV Mean Grad:      3.0 mmHg LVOT Vmax:         90.30 cm/s LVOT Vmean:        59.200 cm/s LVOT VTI:          0.147 m LVOT/AV VTI ratio: 0.72  AORTA Ao Root diam: 3.00 cm Ao Asc diam:  3.00 cm MITRAL VALVE                TRICUSPID VALVE MV Area (PHT): 3.91 cm     TR Peak grad:   21.9 mmHg MV Decel Time: 194 msec     TR Vmax:        234.00 cm/s MV E velocity: 100.00 cm/s MV A velocity: 40.80 cm/s   SHUNTS MV E/A ratio:  2.45         Systemic VTI:  0.15 m                             Systemic Diam: 2.20 cm Maude Emmer MD Electronically signed by Maude Emmer MD Signature Date/Time: 03/06/2024/3:19:22 PM    Final    DG CHEST PORT 1 VIEW Result Date: 03/06/2024 CLINICAL DATA:  Endotracheal tube EXAM: PORTABLE CHEST 1 VIEW COMPARISON:  Radiographs 03/06/2024 FINDINGS: Endotracheal to 1.2 cm from carina. Consider retraction retraction by 2-3 cm. Stable large cardiac silhouette. Central venous congestion and pulmonary edema pattern is slightly improved. Small effusions. No pneumothorax IMPRESSION: 1. Endotracheal tube tip 1.2 cm from carina. Consider  retraction by 2-3 cm. 2. Slight improvement in pulmonary edema. Electronically Signed   By: Jackquline Boxer M.D.   On: 03/06/2024 10:31   DG Chest Port 1 View Result Date: 03/06/2024 CLINICAL DATA:  Severe dyspnea. EXAM: PORTABLE CHEST 1 VIEW COMPARISON:  02/08/2024 FINDINGS: The cardio pericardial silhouette is enlarged. Vascular congestion with diffuse interstitial and hazy airspace disease suggests edema. Probable trace effusions. Telemetry leads overlie the chest. IMPRESSION: Enlargement of the cardiopericardial silhouette with vascular congestion and pulmonary edema. Electronically Signed   By: Camellia Candle M.D.   On: 03/06/2024 08:20   CT Renal Stone Study Result Date: 03/06/2024 CLINICAL DATA:  Abdominal and flank pain, stone suspected EXAM: CT ABDOMEN AND PELVIS WITHOUT CONTRAST TECHNIQUE: Multidetector CT imaging of the abdomen and pelvis was performed following the standard protocol without IV contrast. RADIATION DOSE REDUCTION: This exam was performed according to the departmental dose-optimization program which includes automated exposure control, adjustment of the mA and/or kV according to patient size and/or use of iterative reconstruction technique. COMPARISON:  02/08/2024 FINDINGS: Lower chest: Cardiomegaly. Small pericardial effusion, similar to prior. Trace pleural effusions and interlobular septal thickening throughout the lung bases. Hepatobiliary: No solid liver abnormality is seen. Hepatomegaly, maximum coronal span 23.8 cm. No gallstones, gallbladder wall thickening, or biliary dilatation. Pancreas: Unremarkable. No pancreatic ductal dilatation or surrounding inflammatory changes. Spleen: Normal in size without significant abnormality. Adrenals/Urinary Tract: Adrenal glands are unremarkable. Mildly atrophic kidneys. No calculi or hydronephrosis. Bladder is unremarkable. Stomach/Bowel: Stomach is within normal limits. Appendix appears normal. No evidence of bowel wall thickening,  distention, or inflammatory changes. Vascular/Lymphatic: Scattered infrarenal abdominal aortic atherosclerosis. Enlarged retroperitoneal lymph nodes with scattered calcifications. Reproductive: No mass or other significant abnormality. Other: No abdominal wall hernia. Anasarca. Dystrophic soft tissue calcifications throughout the buttocks. No ascites. Musculoskeletal: No acute or significant osseous findings. Renal osteodystrophy. IMPRESSION: 1. No acute noncontrast CT findings of the abdomen or pelvis to explain abdominal pain. Atrophic kidneys. No urinary tract calculi or hydronephrosis. 2. Cardiomegaly with small pericardial effusion and bibasilar pulmonary edema. 3. Hepatomegaly. 4. Enlarged retroperitoneal lymph nodes with scattered calcifications, in keeping with history of lupus. 5. Renal osteodystrophy. 6. Anasarca. Aortic Atherosclerosis (ICD10-I70.0). Electronically Signed   By: Marolyn JONETTA Jaksch M.D.   On: 03/06/2024 06:39   (Echo, Carotid, EGD, Colonoscopy, ERCP)    Subjective:  She reports some headache this morning after receiving Imdur  yesterday, otherwise no dyspnea, ambulated in the hallway today maintaining 92% on room air Discharge Exam: Vitals:   03/11/24 0907 03/11/24 0910  BP:    Pulse: 75 80  Resp:    Temp:    SpO2: 100% 97%   Vitals:   03/11/24 0206 03/11/24 0843 03/11/24 0907 03/11/24 0910  BP: ROLLEN)  173/125 (!) 143/100    Pulse:   75 80  Resp:      Temp:  97.7 F (36.5 C)    TempSrc:  Oral    SpO2:   100% 97%  Weight:      Height:        General: Pt is alert, awake, not in acute distress Cardiovascular: RRR, S1/S2 +, no rubs, no gallops Respiratory: CTA bilaterally, no wheezing, no rhonchi Abdominal: Soft, NT, ND, bowel sounds + Extremities: no edema, no cyanosis    The results of significant diagnostics from this hospitalization (including imaging, microbiology, ancillary and laboratory) are listed below for reference.     Microbiology: Recent Results  (from the past 240 hours)  Culture, blood (Routine X 2) w Reflex to ID Panel     Status: None   Collection Time: 03/06/24 10:08 AM   Specimen: BLOOD RIGHT ARM  Result Value Ref Range Status   Specimen Description BLOOD RIGHT ARM  Final   Special Requests   Final    BOTTLES DRAWN AEROBIC AND ANAEROBIC Blood Culture results may not be optimal due to an inadequate volume of blood received in culture bottles   Culture   Final    NO GROWTH 5 DAYS Performed at [facility], [address], [place], [place] [address]    Report Status 03/11/2024 FINAL  Final  Respiratory (~20 pathogens) panel by PCR     Status: None   Collection Time: 03/06/24 10:09 AM   Specimen: Anterior Nasal Swab; Respiratory  Result Value Ref Range Status   Adenovirus NOT DETECTED NOT DETECTED Final   Coronavirus 229E NOT DETECTED NOT DETECTED Final    Comment: (NOTE) The Coronavirus on the Respiratory Panel, DOES NOT test for the novel  Coronavirus (2019 nCoV)    Coronavirus HKU1 NOT DETECTED NOT DETECTED Final   Coronavirus NL63 NOT DETECTED NOT DETECTED Final   Coronavirus OC43 NOT DETECTED NOT DETECTED Final   Metapneumovirus NOT DETECTED NOT DETECTED Final   Rhinovirus / Enterovirus NOT DETECTED NOT DETECTED Final   Influenza A NOT DETECTED NOT DETECTED Final   Influenza B NOT DETECTED NOT DETECTED Final   Parainfluenza Virus 1 NOT DETECTED NOT DETECTED Final   Parainfluenza Virus 2 NOT DETECTED NOT DETECTED Final   Parainfluenza Virus 3 NOT DETECTED NOT DETECTED Final   Parainfluenza Virus 4 NOT DETECTED NOT DETECTED Final   Respiratory Syncytial Virus NOT DETECTED NOT DETECTED Final   Bordetella pertussis NOT DETECTED NOT DETECTED Final   Bordetella Parapertussis NOT DETECTED NOT DETECTED Final   Chlamydophila pneumoniae NOT DETECTED NOT DETECTED Final   Mycoplasma pneumoniae NOT DETECTED NOT DETECTED Final    Comment: Performed at [facility], [address], [place], [place] [address]  Resp  panel by RT-PCR (RSV, Flu A&B, Covid) Anterior Nasal Swab     Status: None   Collection Time: 03/06/24 10:09 AM   Specimen: Anterior Nasal Swab  Result Value Ref Range Status   SARS Coronavirus 2 by RT PCR NEGATIVE NEGATIVE Final   Influenza A by PCR NEGATIVE NEGATIVE Final   Influenza B by PCR NEGATIVE NEGATIVE Final    Comment: (NOTE) The Xpert Xpress SARS-CoV-2/FLU/RSV plus assay is intended as an aid in the diagnosis of influenza from Nasopharyngeal swab specimens and should not be used as a sole basis for treatment. Nasal washings and aspirates are unacceptable for Xpert Xpress SARS-CoV-2/FLU/RSV testing.  Fact Sheet for Patients: BloggerCourse.com  Fact Sheet for Healthcare  Providers: SeriousBroker.it  This test is not yet approved or cleared by the [place]  FDA and has been authorized for detection and/or diagnosis of SARS-CoV-2 by FDA under an Emergency Use Authorization (EUA). This EUA will remain in effect (meaning this test can be used) for the duration of the COVID-19 declaration under Section 564(b)(1) of the Act, 21 U.S.C. section 360bbb-3(b)(1), unless the authorization is terminated or revoked.     Resp Syncytial Virus by PCR NEGATIVE NEGATIVE Final    Comment: (NOTE) Fact Sheet for Patients: BloggerCourse.com  Fact Sheet for Healthcare Providers: SeriousBroker.it  This test is not yet approved or cleared by the [place]  FDA and has been authorized for detection and/or diagnosis of SARS-CoV-2 by FDA under an Emergency Use Authorization (EUA). This EUA will remain in effect (meaning this test can be used) for the duration of the COVID-19 declaration under Section 564(b)(1) of the Act, 21 U.S.C. section 360bbb-3(b)(1), unless the authorization is terminated or revoked.  Performed at [facility], [address] [place], [place] [address]    Culture, blood (Routine X 2) w Reflex to ID Panel     Status: None   Collection Time: 03/06/24 10:13 AM   Specimen: BLOOD RIGHT ARM  Result Value Ref Range Status   Specimen Description BLOOD RIGHT ARM  Final   Special Requests   Final    BOTTLES DRAWN AEROBIC AND ANAEROBIC Blood Culture results may not be optimal due to an inadequate volume of blood received in culture bottles   Culture   Final    NO GROWTH 5 DAYS Performed at [facility], [address] [place], [place] [address]    Report Status 03/11/2024 FINAL  Final  MRSA Next Gen by PCR, Nasal     Status: None   Collection Time: 03/06/24 11:47 AM  Result Value Ref Range Status   MRSA by PCR Next Gen NOT DETECTED NOT DETECTED Final    Comment: (NOTE) The GeneXpert MRSA Assay (FDA approved for NASAL specimens only), is one component of a comprehensive MRSA colonization surveillance program. It is not intended to diagnose MRSA infection nor to guide or monitor treatment for MRSA infections. Test performance is not FDA approved in patients less than 33 years old. Performed at [facility], [address], [place], [place] [address]      Labs: BNP (last 3 results) Recent Labs    12/22/23 0012 01/11/24 0348 02/08/24 1451  BNP >4,500.0* >4,500.0* >4,500.0*   Basic Metabolic Panel: Recent Labs  Lab 03/07/24 1026 03/08/24 0608 03/09/24 0607 03/10/24 0649 03/11/24 0946  NA 135 131* 132* 129* 131*  K 4.7 4.5 4.6 4.5 4.5  CL 92* 92* 92* 88* 92*  CO2 25 23 23  19* 22  GLUCOSE 89 68* 79 76 116*  BUN 29* 20 45* 57* 54*  CREATININE 6.50* 5.09* 8.69* 10.84* 8.36*  CALCIUM  9.0 8.8* 9.1 9.6 10.0  PHOS  --   --   --  8.6*  --    Liver Function Tests: Recent Labs  Lab 03/06/24 0417 03/07/24 0544 03/10/24 0649  AST 17 21  --   ALT 11 10  --   ALKPHOS 85 66  --   BILITOT 0.6 1.8*  --   PROT 7.5 7.3  --   ALBUMIN  3.1* 2.9* 2.7*   Recent Labs  Lab 03/07/24 1026  LIPASE 26   No results for input(s):  AMMONIA in the last 168 hours. CBC: Recent Labs  Lab 03/06/24 0417 03/06/24 0815 03/07/24 0544 03/08/24 0608 03/09/24 0607 03/10/24 0619 03/11/24 0946  WBC 7.5   < > 8.3 6.2 6.8 6.6 6.6  NEUTROABS 4.0  --   --  3.0 3.3 3.4 4.5  HGB 9.3*   < > 9.9* 9.8* 9.2* 9.0* 8.3*  HCT 31.4*   < > 31.4* 31.9* 30.0* 28.5* 26.8*  MCV 94.3   < > 90.0 93.0 92.6 90.2 89.6  PLT 206   < > 232 204 228 210 207   < > = values in this interval not displayed.   Cardiac Enzymes: No results for input(s): CKTOTAL, CKMB, CKMBINDEX, TROPONINI in the last 168 hours. BNP: Invalid input(s): POCBNP CBG: Recent Labs  Lab 03/10/24 1756 03/10/24 2104 03/11/24 0029 03/11/24 0414 03/11/24 0847  GLUCAP 179* 105* 120* 119* 122*   D-Dimer No results for input(s): DDIMER in the last 72 hours. Hgb A1c No results for input(s): HGBA1C in the last 72 hours. Lipid Profile Recent Labs    03/10/24 0619  TRIG 155*   Thyroid function studies No results for input(s): TSH, T4TOTAL, T3FREE, THYROIDAB in the last 72 hours.  Invalid input(s): FREET3 Anemia work up No results for input(s): VITAMINB12, FOLATE, FERRITIN, TIBC, IRON , RETICCTPCT in the last 72 hours. Urinalysis    Component Value Date/Time   COLORURINE STRAW (A) 03/06/2024 0606   APPEARANCEUR HAZY (A) 03/06/2024 0606   LABSPEC 1.005 03/06/2024 0606   PHURINE 8.0 03/06/2024 0606   GLUCOSEU 50 (A) 03/06/2024 0606   HGBUR NEGATIVE 03/06/2024 0606   BILIRUBINUR NEGATIVE 03/06/2024 0606   BILIRUBINUR Negative 02/10/2018 1602   KETONESUR NEGATIVE 03/06/2024 0606   PROTEINUR 100 (A) 03/06/2024 0606   UROBILINOGEN 0.2 02/10/2018 1602   UROBILINOGEN 1.0 03/30/2015 2328   NITRITE NEGATIVE 03/06/2024 0606   LEUKOCYTESUR NEGATIVE 03/06/2024 0606   Sepsis Labs Recent Labs  Lab 03/08/24 9391 03/09/24 0607 03/10/24 0619 03/11/24 0946  WBC 6.2 6.8 6.6 6.6   Microbiology Recent Results (from the past 240 hours)   Culture, blood (Routine X 2) w Reflex to ID Panel     Status: None   Collection Time: 03/06/24 10:08 AM   Specimen: BLOOD RIGHT ARM  Result Value Ref Range Status   Specimen Description BLOOD RIGHT ARM  Final   Special Requests   Final    BOTTLES DRAWN AEROBIC AND ANAEROBIC Blood Culture results may not be optimal due to an inadequate volume of blood received in culture bottles   Culture   Final    NO GROWTH 5 DAYS Performed at [facility], [address] [place], [place] [address]    Report Status 03/11/2024 FINAL  Final  Respiratory (~20 pathogens) panel by PCR     Status: None   Collection Time: 03/06/24 10:09 AM   Specimen: Anterior Nasal Swab; Respiratory  Result Value Ref Range Status   Adenovirus NOT DETECTED NOT DETECTED Final   Coronavirus 229E NOT DETECTED NOT DETECTED Final    Comment: (NOTE) The Coronavirus on the Respiratory Panel, DOES NOT test for the novel  Coronavirus (2019 nCoV)    Coronavirus HKU1 NOT DETECTED NOT DETECTED Final   Coronavirus NL63 NOT DETECTED NOT DETECTED Final   Coronavirus OC43 NOT DETECTED NOT DETECTED Final   Metapneumovirus NOT DETECTED NOT DETECTED Final   Rhinovirus / Enterovirus NOT DETECTED NOT DETECTED Final   Influenza A NOT DETECTED NOT DETECTED Final   Influenza B NOT DETECTED NOT DETECTED Final   Parainfluenza Virus 1  NOT DETECTED NOT DETECTED Final   Parainfluenza Virus 2 NOT DETECTED NOT DETECTED Final   Parainfluenza Virus 3 NOT DETECTED NOT DETECTED Final   Parainfluenza Virus 4 NOT DETECTED NOT DETECTED Final   Respiratory Syncytial Virus NOT DETECTED NOT DETECTED Final   Bordetella pertussis NOT DETECTED NOT DETECTED Final   Bordetella Parapertussis NOT DETECTED NOT DETECTED Final   Chlamydophila pneumoniae NOT DETECTED NOT DETECTED Final   Mycoplasma pneumoniae NOT DETECTED NOT DETECTED Final    Comment: Performed at [facility], [address], [place], [place] [address]  Resp panel by RT-PCR (RSV, Flu  A&B, Covid) Anterior Nasal Swab     Status: None   Collection Time: 03/06/24 10:09 AM   Specimen: Anterior Nasal Swab  Result Value Ref Range Status   SARS Coronavirus 2 by RT PCR NEGATIVE NEGATIVE Final   Influenza A by PCR NEGATIVE NEGATIVE Final   Influenza B by PCR NEGATIVE NEGATIVE Final    Comment: (NOTE) The Xpert Xpress SARS-CoV-2/FLU/RSV plus assay is intended as an aid in the diagnosis of influenza from Nasopharyngeal swab specimens and should not be used as a sole basis for treatment. Nasal washings and aspirates are unacceptable for Xpert Xpress SARS-CoV-2/FLU/RSV testing.  Fact Sheet for Patients: BloggerCourse.com  Fact Sheet for Healthcare Providers: SeriousBroker.it  This test is not yet approved or cleared by the [place]  FDA and has been authorized for detection and/or diagnosis of SARS-CoV-2 by FDA under an Emergency Use Authorization (EUA). This EUA will remain in effect (meaning this test can be used) for the duration of the COVID-19 declaration under Section 564(b)(1) of the Act, 21 U.S.C. section 360bbb-3(b)(1), unless the authorization is terminated or revoked.     Resp Syncytial Virus by PCR NEGATIVE NEGATIVE Final    Comment: (NOTE) Fact Sheet for Patients: BloggerCourse.com  Fact Sheet for Healthcare Providers: SeriousBroker.it  This test is not yet approved or cleared by the [place]  FDA and has been authorized for detection and/or diagnosis of SARS-CoV-2 by FDA under an Emergency Use Authorization (EUA). This EUA will remain in effect (meaning this test can be used) for the duration of the COVID-19 declaration under Section 564(b)(1) of the Act, 21 U.S.C. section 360bbb-3(b)(1), unless the authorization is terminated or revoked.  Performed at [facility], [address], [place], [place] [address]   Culture, blood (Routine X 2)  w Reflex to ID Panel     Status: None   Collection Time: 03/06/24 10:13 AM   Specimen: BLOOD RIGHT ARM  Result Value Ref Range Status   Specimen Description BLOOD RIGHT ARM  Final   Special Requests   Final    BOTTLES DRAWN AEROBIC AND ANAEROBIC Blood Culture results may not be optimal due to an inadequate volume of blood received in culture bottles   Culture   Final    NO GROWTH 5 DAYS Performed at [facility], [address] [place], [place] [address]    Report Status 03/11/2024 FINAL  Final  MRSA Next Gen by PCR, Nasal     Status: None   Collection Time: 03/06/24 11:47 AM  Result Value Ref Range Status   MRSA by PCR Next Gen NOT DETECTED NOT DETECTED Final    Comment: (NOTE) The GeneXpert MRSA Assay (FDA approved for NASAL specimens only), is one component of a comprehensive MRSA colonization surveillance program. It is not intended to diagnose MRSA infection nor to guide or monitor treatment for MRSA infections. Test  performance is not FDA approved in patients less than 23 years old. Performed at [facility], [address], [place], [place] [address]      Time coordinating discharge: Over 30 minutes  SIGNED:   Brayton Lye, MD  [facility] 03/11/2024, 11:29 AM Pager   If 7PM-7AM, please contact night-coverage [email or web address] Password [email or web address]

## 2024-03-11 NOTE — Plan of Care (Signed)
  Problem: Education: Goal: Ability to describe self-care measures that may prevent or decrease complications (Diabetes Survival Skills Education) will improve Outcome: Progressing   Problem: Clinical Measurements: Goal: Ability to maintain clinical measurements within normal limits will improve Outcome: Progressing   Problem: Pain Managment: Goal: General experience of comfort will improve and/or be controlled Outcome: Progressing   Problem: Safety: Goal: Ability to remain free from injury will improve Outcome: Progressing

## 2024-03-11 NOTE — Discharge Planning (Signed)
 [facility] Patient Discharge Orders- [facility] CLINIC: [facility]  Patient's name: Linda [name] Admit/DC Dates: 03/06/2024 - 03/11/2024  Discharge Diagnoses: Acute hypoxic respiratory failure 2/2 volume overload/missed dialysis    Hypertensive emergency   Recent Labs  Lab 03/10/24 0649 03/11/24 0946  HGB  --  8.3*  K 4.5 4.5  CALCIUM  9.6 10.0  PHOS 8.6*  --   ALBUMIN  2.7*  --     Aranesp : Given: --   Date of last dose/amount: --   PRBC's Given: -- Date/# of units: --  Outpatient Dialysis Orders:  -Heparin : No change  -EDW: Weights variable here. Appears still over EDW so no change for now.   -Bath: No change    Access intervention/Change: Fistulogram with [facility] 7/11 --patent access, no abnormalities.    OTHER/APPTS/LABS    Completed by: Maisie Ronnald Acosta PA-C   D/C Meds to be reconciled by nurse after every discharge.    Reviewed by: MD:______ RN_______

## 2024-03-11 NOTE — Discharge Instructions (Signed)
 Follow with Primary MD [name], NP in 7 days   Get CBC, CMP,  checked  by Primary MD next visit.    Activity: As tolerated with Full fall precautions use walker/cane & assistance as needed   Disposition Home    Diet: Renal Diet with 1200 cc fluid restriction   On your next visit with your primary care physician please Get Medicines reviewed and adjusted.   Please request your Prim.MD to go over all Hospital Tests and Procedure/Radiological results at the follow up, please get all Hospital records sent to your Prim MD by signing hospital release before you go home.   If you experience worsening of your admission symptoms, develop shortness of breath, life threatening emergency, suicidal or homicidal thoughts you must seek medical attention immediately by calling 911 or calling your MD immediately  if symptoms less severe.  You Must read complete instructions/literature along with all the possible adverse reactions/side effects for all the Medicines you take and that have been prescribed to you. Take any new Medicines after you have completely understood and accpet all the possible adverse reactions/side effects.   Do not drive, operating heavy machinery, perform activities at heights, swimming or participation in water activities or provide baby sitting services if your were admitted for syncope or siezures until you have seen by Primary MD or a Neurologist and advised to do so again.  Do not drive when taking Pain medications.    Do not take more than prescribed Pain, Sleep and Anxiety Medications  Special Instructions: If you have smoked or chewed Tobacco  in the last 2 yrs please stop smoking, stop any regular Alcohol  and or any Recreational drug use.  Wear Seat belts while driving.   Please note  You were cared for by a hospitalist during your hospital stay. If you have any questions about your discharge medications or the care you received while you were in the  hospital after you are discharged, you can call the unit and asked to speak with the hospitalist on call if the hospitalist that took care of you is not available. Once you are discharged, your primary care physician will handle any further medical issues. Please note that NO REFILLS for any discharge medications will be authorized once you are discharged, as it is imperative that you return to your primary care physician (or establish a relationship with a primary care physician if you do not have one) for your aftercare needs so that they can reassess your need for medications and monitor your lab values.

## 2024-03-12 ENCOUNTER — Encounter (HOSPITAL_COMMUNITY): Payer: Self-pay | Admitting: Surgery

## 2024-03-13 ENCOUNTER — Telehealth (HOSPITAL_COMMUNITY): Payer: Self-pay | Admitting: Nephrology

## 2024-03-13 ENCOUNTER — Telehealth: Payer: Self-pay

## 2024-03-13 DIAGNOSIS — D631 Anemia in chronic kidney disease: Secondary | ICD-10-CM | POA: Diagnosis not present

## 2024-03-13 DIAGNOSIS — D509 Iron deficiency anemia, unspecified: Secondary | ICD-10-CM | POA: Diagnosis not present

## 2024-03-13 DIAGNOSIS — I132 Hypertensive heart and chronic kidney disease with heart failure and with stage 5 chronic kidney disease, or end stage renal disease: Secondary | ICD-10-CM | POA: Diagnosis not present

## 2024-03-13 DIAGNOSIS — N186 End stage renal disease: Secondary | ICD-10-CM | POA: Diagnosis not present

## 2024-03-13 DIAGNOSIS — R52 Pain, unspecified: Secondary | ICD-10-CM | POA: Diagnosis not present

## 2024-03-13 DIAGNOSIS — Z992 Dependence on renal dialysis: Secondary | ICD-10-CM | POA: Diagnosis not present

## 2024-03-13 DIAGNOSIS — D689 Coagulation defect, unspecified: Secondary | ICD-10-CM | POA: Diagnosis not present

## 2024-03-13 DIAGNOSIS — N2581 Secondary hyperparathyroidism of renal origin: Secondary | ICD-10-CM | POA: Diagnosis not present

## 2024-03-13 NOTE — Transitions of Care (Post Inpatient/ED Visit) (Signed)
   03/13/2024  Name: Linda [name] MRN: [id number] DOB: Dec 13, 1995  Today's TOC FU Call Status: Today's TOC FU Call Status:: Unsuccessful Call (1st Attempt) Unsuccessful Call (1st Attempt) Date: 03/13/24  Attempted to reach the patient regarding the most recent Inpatient/ED visit. On chart review for this outreach it was noted that patient had HD MWF per HD Navigator note.   Follow Up Plan: Additional outreach attempts will be made to reach the patient to complete the Transitions of Care (Post Inpatient/ED visit) call.    Bing Edison MSN, RN RN Care Manager [facility]  [facility] Office Hours M-F [phone] Direct Dial : [phone] Main Phone [phone]  Fax: [phone] [email or web address]

## 2024-03-13 NOTE — Telephone Encounter (Signed)
 Transition of care contact from inpatient facility  Date of Discharge: 03/11/24 Date of Contact:03/13/24 - attempt #1 Method of contact: Phone  Attempted to contact patient to discuss transition of care from inpatient admission. Patient did not answer the phone. Message was left on the patient's voicemail with call back number [phone].  Izetta Boehringer, PA-C [employer] Pager [phone]

## 2024-03-13 NOTE — Progress Notes (Signed)
 Late Note Entry- March 13, 2024  Pt d/c on Saturday. Contacted [facility] to be advised of pt's d/c date and that pt should resume care today.   Randine Mungo Renal Navigator [phone]

## 2024-03-14 ENCOUNTER — Telehealth: Payer: Self-pay

## 2024-03-14 MED FILL — Lidocaine HCl Local Preservative Free (PF) Inj 1%: INTRAMUSCULAR | Qty: 30 | Status: AC

## 2024-03-14 NOTE — Transitions of Care (Post Inpatient/ED Visit) (Signed)
   03/14/2024  Name: Linda [name] MRN: [id number] DOB: 1996-06-10  Today's TOC FU Call Status: Today's TOC FU Call Status:: Unsuccessful Call (2nd Attempt) Unsuccessful Call (2nd Attempt) Date: 03/14/24  Attempted to reach the patient regarding the most recent Inpatient/ED visit.  Follow Up Plan: No further outreach at this time:  Patient answered outreach call, thought her ex was calling, but stated she was fine, doing okay and did not need any follow up calls. PCP HFU shows scheduled 03/30/24 with Philippe Slade at [facility], not [facility].    Bing Edison MSN, RN RN Case Sales executive Health  [facility]BC[facility]-Population Health Office Hours M-F [phone] Direct Dial : [phone] Main Phone [phone]  Fax: [phone] [email or web address]

## 2024-03-15 ENCOUNTER — Other Ambulatory Visit (HOSPITAL_COMMUNITY): Payer: Self-pay

## 2024-03-15 ENCOUNTER — Telehealth: Payer: Self-pay | Admitting: Family Medicine

## 2024-03-15 DIAGNOSIS — R52 Pain, unspecified: Secondary | ICD-10-CM | POA: Diagnosis not present

## 2024-03-15 DIAGNOSIS — I132 Hypertensive heart and chronic kidney disease with heart failure and with stage 5 chronic kidney disease, or end stage renal disease: Secondary | ICD-10-CM | POA: Diagnosis not present

## 2024-03-15 DIAGNOSIS — N2581 Secondary hyperparathyroidism of renal origin: Secondary | ICD-10-CM | POA: Diagnosis not present

## 2024-03-15 DIAGNOSIS — J810 Acute pulmonary edema: Secondary | ICD-10-CM | POA: Diagnosis not present

## 2024-03-15 DIAGNOSIS — Z992 Dependence on renal dialysis: Secondary | ICD-10-CM | POA: Diagnosis not present

## 2024-03-15 DIAGNOSIS — N186 End stage renal disease: Secondary | ICD-10-CM | POA: Diagnosis not present

## 2024-03-15 DIAGNOSIS — M3210 Systemic lupus erythematosus, organ or system involvement unspecified: Secondary | ICD-10-CM | POA: Diagnosis not present

## 2024-03-15 DIAGNOSIS — D631 Anemia in chronic kidney disease: Secondary | ICD-10-CM | POA: Diagnosis not present

## 2024-03-15 DIAGNOSIS — D509 Iron deficiency anemia, unspecified: Secondary | ICD-10-CM | POA: Diagnosis not present

## 2024-03-15 DIAGNOSIS — D689 Coagulation defect, unspecified: Secondary | ICD-10-CM | POA: Diagnosis not present

## 2024-03-15 LAB — URINE DRUGS OF ABUSE SCREEN W ALC, ROUTINE (REF LAB)
Amphetamines, Urine: NEGATIVE ng/mL
Barbiturate, Ur: NEGATIVE ng/mL
Cannabinoid Quant, Ur: NEGATIVE ng/mL
Cocaine (Metab.): NEGATIVE ng/mL
Creatinine, Urine: 20.2 mg/dL (ref 20.0–300.0)
Ethanol U, Quan: NEGATIVE %
Methadone Screen, Urine: NEGATIVE ng/mL
Nitrite Urine, Quantitative: NEGATIVE ug/mL
OPIATE SCREEN URINE: NEGATIVE ng/mL
Phencyclidine, Ur: NEGATIVE ng/mL
Propoxyphene, Urine: NEGATIVE ng/mL
pH, Urine: 8 (ref 4.5–8.9)

## 2024-03-15 LAB — DRUG PROFILE 799031: BENZODIAZEPINES: NEGATIVE

## 2024-03-15 NOTE — Telephone Encounter (Unsigned)
 Copied from CRM [id number]. Topic: Referral - Question >> Mar 14, 2024  5:15 PM Chiquita SQUIBB wrote: Reason for CRM: Patient is calling to check the status of her referral

## 2024-03-17 DIAGNOSIS — M329 Systemic lupus erythematosus, unspecified: Secondary | ICD-10-CM | POA: Diagnosis not present

## 2024-03-17 DIAGNOSIS — Z79899 Other long term (current) drug therapy: Secondary | ICD-10-CM | POA: Diagnosis not present

## 2024-03-17 DIAGNOSIS — R21 Rash and other nonspecific skin eruption: Secondary | ICD-10-CM | POA: Diagnosis not present

## 2024-03-18 DIAGNOSIS — D509 Iron deficiency anemia, unspecified: Secondary | ICD-10-CM | POA: Diagnosis not present

## 2024-03-18 DIAGNOSIS — R52 Pain, unspecified: Secondary | ICD-10-CM | POA: Diagnosis not present

## 2024-03-18 DIAGNOSIS — D689 Coagulation defect, unspecified: Secondary | ICD-10-CM | POA: Diagnosis not present

## 2024-03-18 DIAGNOSIS — N186 End stage renal disease: Secondary | ICD-10-CM | POA: Diagnosis not present

## 2024-03-18 DIAGNOSIS — I132 Hypertensive heart and chronic kidney disease with heart failure and with stage 5 chronic kidney disease, or end stage renal disease: Secondary | ICD-10-CM | POA: Diagnosis not present

## 2024-03-18 DIAGNOSIS — N2581 Secondary hyperparathyroidism of renal origin: Secondary | ICD-10-CM | POA: Diagnosis not present

## 2024-03-18 DIAGNOSIS — Z992 Dependence on renal dialysis: Secondary | ICD-10-CM | POA: Diagnosis not present

## 2024-03-18 DIAGNOSIS — D631 Anemia in chronic kidney disease: Secondary | ICD-10-CM | POA: Diagnosis not present

## 2024-03-20 DIAGNOSIS — D689 Coagulation defect, unspecified: Secondary | ICD-10-CM | POA: Diagnosis not present

## 2024-03-20 DIAGNOSIS — N186 End stage renal disease: Secondary | ICD-10-CM | POA: Diagnosis not present

## 2024-03-20 DIAGNOSIS — Z992 Dependence on renal dialysis: Secondary | ICD-10-CM | POA: Diagnosis not present

## 2024-03-20 DIAGNOSIS — D509 Iron deficiency anemia, unspecified: Secondary | ICD-10-CM | POA: Diagnosis not present

## 2024-03-20 DIAGNOSIS — D631 Anemia in chronic kidney disease: Secondary | ICD-10-CM | POA: Diagnosis not present

## 2024-03-20 DIAGNOSIS — I132 Hypertensive heart and chronic kidney disease with heart failure and with stage 5 chronic kidney disease, or end stage renal disease: Secondary | ICD-10-CM | POA: Diagnosis not present

## 2024-03-20 DIAGNOSIS — N2581 Secondary hyperparathyroidism of renal origin: Secondary | ICD-10-CM | POA: Diagnosis not present

## 2024-03-20 DIAGNOSIS — R52 Pain, unspecified: Secondary | ICD-10-CM | POA: Diagnosis not present

## 2024-03-22 DIAGNOSIS — I132 Hypertensive heart and chronic kidney disease with heart failure and with stage 5 chronic kidney disease, or end stage renal disease: Secondary | ICD-10-CM | POA: Diagnosis not present

## 2024-03-22 DIAGNOSIS — N186 End stage renal disease: Secondary | ICD-10-CM | POA: Diagnosis not present

## 2024-03-22 DIAGNOSIS — D689 Coagulation defect, unspecified: Secondary | ICD-10-CM | POA: Diagnosis not present

## 2024-03-22 DIAGNOSIS — Z992 Dependence on renal dialysis: Secondary | ICD-10-CM | POA: Diagnosis not present

## 2024-03-22 DIAGNOSIS — D509 Iron deficiency anemia, unspecified: Secondary | ICD-10-CM | POA: Diagnosis not present

## 2024-03-22 DIAGNOSIS — N2581 Secondary hyperparathyroidism of renal origin: Secondary | ICD-10-CM | POA: Diagnosis not present

## 2024-03-22 DIAGNOSIS — R52 Pain, unspecified: Secondary | ICD-10-CM | POA: Diagnosis not present

## 2024-03-22 DIAGNOSIS — D631 Anemia in chronic kidney disease: Secondary | ICD-10-CM | POA: Diagnosis not present

## 2024-03-27 DIAGNOSIS — D631 Anemia in chronic kidney disease: Secondary | ICD-10-CM | POA: Diagnosis not present

## 2024-03-27 DIAGNOSIS — I132 Hypertensive heart and chronic kidney disease with heart failure and with stage 5 chronic kidney disease, or end stage renal disease: Secondary | ICD-10-CM | POA: Diagnosis not present

## 2024-03-27 DIAGNOSIS — D509 Iron deficiency anemia, unspecified: Secondary | ICD-10-CM | POA: Diagnosis not present

## 2024-03-27 DIAGNOSIS — N2581 Secondary hyperparathyroidism of renal origin: Secondary | ICD-10-CM | POA: Diagnosis not present

## 2024-03-27 DIAGNOSIS — N186 End stage renal disease: Secondary | ICD-10-CM | POA: Diagnosis not present

## 2024-03-27 DIAGNOSIS — Z992 Dependence on renal dialysis: Secondary | ICD-10-CM | POA: Diagnosis not present

## 2024-03-27 DIAGNOSIS — R52 Pain, unspecified: Secondary | ICD-10-CM | POA: Diagnosis not present

## 2024-03-27 DIAGNOSIS — D689 Coagulation defect, unspecified: Secondary | ICD-10-CM | POA: Diagnosis not present

## 2024-03-30 ENCOUNTER — Encounter: Payer: Self-pay | Admitting: Family Medicine

## 2024-03-30 ENCOUNTER — Ambulatory Visit (INDEPENDENT_AMBULATORY_CARE_PROVIDER_SITE_OTHER): Admitting: Family Medicine

## 2024-03-30 VITALS — BP 166/120 | HR 101 | Temp 97.7°F | Wt 167.4 lb

## 2024-03-30 DIAGNOSIS — D689 Coagulation defect, unspecified: Secondary | ICD-10-CM | POA: Diagnosis not present

## 2024-03-30 DIAGNOSIS — I132 Hypertensive heart and chronic kidney disease with heart failure and with stage 5 chronic kidney disease, or end stage renal disease: Secondary | ICD-10-CM | POA: Diagnosis not present

## 2024-03-30 DIAGNOSIS — N2581 Secondary hyperparathyroidism of renal origin: Secondary | ICD-10-CM | POA: Diagnosis not present

## 2024-03-30 DIAGNOSIS — M3214 Glomerular disease in systemic lupus erythematosus: Secondary | ICD-10-CM | POA: Diagnosis not present

## 2024-03-30 DIAGNOSIS — Z992 Dependence on renal dialysis: Secondary | ICD-10-CM

## 2024-03-30 DIAGNOSIS — J9601 Acute respiratory failure with hypoxia: Secondary | ICD-10-CM

## 2024-03-30 DIAGNOSIS — Z09 Encounter for follow-up examination after completed treatment for conditions other than malignant neoplasm: Secondary | ICD-10-CM

## 2024-03-30 DIAGNOSIS — N186 End stage renal disease: Secondary | ICD-10-CM | POA: Diagnosis not present

## 2024-03-30 DIAGNOSIS — R52 Pain, unspecified: Secondary | ICD-10-CM | POA: Diagnosis not present

## 2024-03-30 DIAGNOSIS — D509 Iron deficiency anemia, unspecified: Secondary | ICD-10-CM | POA: Diagnosis not present

## 2024-03-30 DIAGNOSIS — M3210 Systemic lupus erythematosus, organ or system involvement unspecified: Secondary | ICD-10-CM | POA: Diagnosis not present

## 2024-03-30 DIAGNOSIS — D631 Anemia in chronic kidney disease: Secondary | ICD-10-CM | POA: Diagnosis not present

## 2024-03-30 MED ORDER — CARVEDILOL 25 MG PO TABS: 25.0000 mg | ORAL_TABLET | Freq: Two times a day (BID) | ORAL | 0 refills | Status: DC

## 2024-03-30 NOTE — Patient Instructions (Addendum)
 It was a pleasure seeing you today!   -Labs ordered (CBCand CMP) to assess hemoglobin, white blood cells and kidney and liver function.  - External pain clinic referral ordered as urgent.  -Continue taking prescription for tylenol  3 from rheumatology.  - I  encouraged you to go to hemodialysis treatment today and regularly.  - Recommend to take blood pressure medication every day, and to let me know if you need refills on medications before running out.  - If [facility], chest pain, headache that won't go away, dizziness or lightheadedness occurs please go to nearest ER.   Return in about 4 weeks (around 04/27/2024).

## 2024-03-30 NOTE — Progress Notes (Signed)
 Established Patient Office Visit  Subjective   Patient ID: Linda [name], female    DOB: Jun 07, 1996  Age: 28 y.o. MRN: [id number]  Chief Complaint  Patient presents with   Hospitalization Follow-up    Linda [name] presents today for a hospital follow-up. She was admitted to [facility] on 7/7 due to acute respiratory failure due to missing hemodialysis. She was intubated at the time. She was discharged on 7/12. She states that she is feeling better and has no shortness of breath. Recommendation was to follow- up with primary care provider and redraw CBC and CMP labs. She went to her hemodialysis treatment on Monday. She missed her hemodialysis treatment yesterday due to waking up late. She is going to receive hemodialysis after this appointment today.   Pain management- She is needing a new referral for pain clinic due to previous referral not taking new patients. She requests pain medicine to get her through to her appointment.    HTN-  She is currently taking carvedilol  25mg  , irbesartan  300mg  and amlodipine  10mg  for hypertension. She has been out of carvedilol  for 2 days. She took all other medication 30-40 minutes before coming to today's appointment. She states that she has not missed doses of medications. She reports that she has headaches every day. Denies chest pain, SHOB, dizziness, and lightheadedness.   Review of Systems  Eyes:  Negative for blurred vision.  Respiratory:  Negative for shortness of breath.   Cardiovascular:  Negative for chest pain.  Neurological:  Positive for headaches (occ). Negative for dizziness.    Objective:   BP (!) 166/120   Pulse (!) 101   Temp 97.7 F (36.5 C) (Oral)   Wt 167 lb 6.4 oz (75.9 kg)   SpO2 99%   BMI 27.02 kg/m  BP Readings from Last 3 Encounters:  03/30/24 (!) 166/120  03/11/24 (!) 136/107  02/12/24 110/75      Physical Exam Constitutional:      Appearance: Normal appearance.  HENT:     Head: Normocephalic and  atraumatic.  Eyes:     Extraocular Movements: Extraocular movements intact.     Conjunctiva/sclera: Conjunctivae normal.  Cardiovascular:     Rate and Rhythm: Regular rhythm. Tachycardia present.     Pulses: Normal pulses.     Heart sounds: Normal heart sounds.  Pulmonary:     Effort: Pulmonary effort is normal.     Breath sounds: Normal breath sounds.  Musculoskeletal:        General: Normal range of motion.     Cervical back: Normal range of motion.  Skin:    General: Skin is warm and dry.     Capillary Refill: Capillary refill takes less than 2 seconds.  Neurological:     Mental Status: She is alert.    Last CBC Lab Results  Component Value Date   WBC 6.6 03/11/2024   HGB 8.3 (L) 03/11/2024   HCT 26.8 (L) 03/11/2024   MCV 89.6 03/11/2024   MCH 27.8 03/11/2024   RDW 17.3 (H) 03/11/2024   PLT 207 03/11/2024   Last metabolic panel Lab Results  Component Value Date   GLUCOSE 116 (H) 03/11/2024   NA 131 (L) 03/11/2024   K 4.5 03/11/2024   CL 92 (L) 03/11/2024   CO2 22 03/11/2024   BUN 54 (H) 03/11/2024   CREATININE 8.36 (H) 03/11/2024   GFRNONAA 6 (L) 03/11/2024   CALCIUM  10.0 03/11/2024   PHOS 8.6 (H) 03/10/2024   PROT 7.3  03/07/2024   ALBUMIN  2.7 (L) 03/10/2024   BILITOT 1.8 (H) 03/07/2024   ALKPHOS 66 03/07/2024   AST 21 03/07/2024   ALT 10 03/07/2024   ANIONGAP 17 (H) 03/11/2024   Assessment & Plan:   Problem List Items Addressed This Visit       Respiratory   Acute hypoxic respiratory failure (HCC) - Primary   Relevant Orders   Basic Metabolic Panel   CBC with Differential/Platelets     Genitourinary   ESRD (end stage renal disease) on dialysis [facility])   Relevant Orders   Basic Metabolic Panel   CBC with Differential/Platelets   Other Visit Diagnoses       Hospital discharge follow-up         Systemic lupus erythematosus with glomerular disease, unspecified SLE type (HCC)       Relevant Orders   Ambulatory referral to Pain Clinic       -Labs ordered (CBCand CMP) to assess hemoglobin, white blood cells and kidney and liver function.  - External pain clinic referral ordered as urgent.  -Patient had a prescription for tylenol  3 from rheumatology.  - Patient encouraged to go to hemodialysis treatment today and regularly.  - Recommend to take blood pressure medication every day, and to let me know if she needs refills on medications before running out.  - If [facility], chest pain, headache that won't go away, dizziness or lightheadedness occurs please go to nearest ER.  Return in about 4 weeks (around 04/27/2024).    JoAnna Williamson, NP

## 2024-03-31 DIAGNOSIS — Z992 Dependence on renal dialysis: Secondary | ICD-10-CM | POA: Diagnosis not present

## 2024-03-31 DIAGNOSIS — N2581 Secondary hyperparathyroidism of renal origin: Secondary | ICD-10-CM | POA: Diagnosis not present

## 2024-03-31 DIAGNOSIS — R110 Nausea: Secondary | ICD-10-CM | POA: Diagnosis not present

## 2024-03-31 DIAGNOSIS — D689 Coagulation defect, unspecified: Secondary | ICD-10-CM | POA: Diagnosis not present

## 2024-03-31 DIAGNOSIS — D509 Iron deficiency anemia, unspecified: Secondary | ICD-10-CM | POA: Diagnosis not present

## 2024-03-31 DIAGNOSIS — D631 Anemia in chronic kidney disease: Secondary | ICD-10-CM | POA: Diagnosis not present

## 2024-04-03 DIAGNOSIS — D631 Anemia in chronic kidney disease: Secondary | ICD-10-CM | POA: Diagnosis not present

## 2024-04-03 DIAGNOSIS — D509 Iron deficiency anemia, unspecified: Secondary | ICD-10-CM | POA: Diagnosis not present

## 2024-04-03 DIAGNOSIS — R110 Nausea: Secondary | ICD-10-CM | POA: Diagnosis not present

## 2024-04-03 DIAGNOSIS — D689 Coagulation defect, unspecified: Secondary | ICD-10-CM | POA: Diagnosis not present

## 2024-04-03 DIAGNOSIS — Z992 Dependence on renal dialysis: Secondary | ICD-10-CM | POA: Diagnosis not present

## 2024-04-03 DIAGNOSIS — N2581 Secondary hyperparathyroidism of renal origin: Secondary | ICD-10-CM | POA: Diagnosis not present

## 2024-04-04 ENCOUNTER — Telehealth: Payer: Self-pay | Admitting: Family Medicine

## 2024-04-04 NOTE — Telephone Encounter (Signed)
 Referral has been sent and lvm for pt letting her know.

## 2024-04-04 NOTE — Telephone Encounter (Signed)
 Copied from CRM [id number]. Topic: Referral - Question >> Mar 14, 2024  5:15 PM Chiquita SQUIBB wrote: Reason for CRM: Patient is calling to check the status of her referral >> Apr 03, 2024  4:42 PM Deleta S wrote: Patient is following up regarding referral she has mentioned another location she would like to get referred to which is [facility]. Please follow up with the patient at [phone]. Patient ask that medical records be sent over also >> Mar 15, 2024 12:35 PM Drema MATSU wrote: Patient called Pain management and was advised that they don't have a referral. They stated that the referral will need to be faxed alog with patient medical records to tell what she is diagnosed with. Fax: [phone]

## 2024-04-06 ENCOUNTER — Emergency Department (HOSPITAL_COMMUNITY)
Admission: EM | Admit: 2024-04-06 | Discharge: 2024-04-06 | Disposition: A | Discharge status: Home / Self Care | Attending: Emergency Medicine | Admitting: Emergency Medicine

## 2024-04-06 ENCOUNTER — Other Ambulatory Visit: Payer: Self-pay

## 2024-04-06 ENCOUNTER — Emergency Department (HOSPITAL_COMMUNITY)

## 2024-04-06 ENCOUNTER — Encounter (HOSPITAL_COMMUNITY): Payer: Self-pay

## 2024-04-06 DIAGNOSIS — R1031 Right lower quadrant pain: Secondary | ICD-10-CM | POA: Insufficient documentation

## 2024-04-06 DIAGNOSIS — R160 Hepatomegaly, not elsewhere classified: Secondary | ICD-10-CM | POA: Diagnosis not present

## 2024-04-06 DIAGNOSIS — I517 Cardiomegaly: Secondary | ICD-10-CM | POA: Diagnosis not present

## 2024-04-06 DIAGNOSIS — I509 Heart failure, unspecified: Secondary | ICD-10-CM | POA: Insufficient documentation

## 2024-04-06 DIAGNOSIS — N186 End stage renal disease: Secondary | ICD-10-CM | POA: Diagnosis not present

## 2024-04-06 DIAGNOSIS — R1030 Lower abdominal pain, unspecified: Secondary | ICD-10-CM | POA: Diagnosis not present

## 2024-04-06 DIAGNOSIS — Z992 Dependence on renal dialysis: Secondary | ICD-10-CM | POA: Diagnosis not present

## 2024-04-06 DIAGNOSIS — R1032 Left lower quadrant pain: Secondary | ICD-10-CM | POA: Insufficient documentation

## 2024-04-06 DIAGNOSIS — R109 Unspecified abdominal pain: Secondary | ICD-10-CM

## 2024-04-06 LAB — URINALYSIS, ROUTINE W REFLEX MICROSCOPIC
Bilirubin Urine: NEGATIVE
Glucose, UA: NEGATIVE mg/dL
Hgb urine dipstick: NEGATIVE
Ketones, ur: NEGATIVE mg/dL
Leukocytes,Ua: NEGATIVE
Nitrite: NEGATIVE
Protein, ur: 100 mg/dL — AB
Specific Gravity, Urine: 1.004 — ABNORMAL LOW (ref 1.005–1.030)
pH: 9 — ABNORMAL HIGH (ref 5.0–8.0)

## 2024-04-06 LAB — COMPREHENSIVE METABOLIC PANEL WITH GFR
ALT: 18 U/L (ref 0–44)
AST: 25 U/L (ref 15–41)
Albumin: 3.4 g/dL — ABNORMAL LOW (ref 3.5–5.0)
Alkaline Phosphatase: 111 U/L (ref 38–126)
Anion gap: 15 (ref 5–15)
BUN: 33 mg/dL — ABNORMAL HIGH (ref 6–20)
CO2: 25 mmol/L (ref 22–32)
Calcium: 9.9 mg/dL (ref 8.9–10.3)
Chloride: 96 mmol/L — ABNORMAL LOW (ref 98–111)
Creatinine, Ser: 7.67 mg/dL — ABNORMAL HIGH (ref 0.44–1.00)
GFR, Estimated: 7 mL/min — ABNORMAL LOW (ref >60)
Glucose, Bld: 78 mg/dL (ref 70–99)
Potassium: 4.8 mmol/L (ref 3.5–5.1)
Sodium: 136 mmol/L (ref 135–145)
Total Bilirubin: 0.9 mg/dL (ref 0.0–1.2)
Total Protein: 9.1 g/dL — ABNORMAL HIGH (ref 6.5–8.1)

## 2024-04-06 LAB — CBC
HCT: 36.5 % (ref 36.0–46.0)
Hemoglobin: 11 g/dL — ABNORMAL LOW (ref 12.0–15.0)
MCH: 28.8 pg (ref 26.0–34.0)
MCHC: 30.1 g/dL (ref 30.0–36.0)
MCV: 95.5 fL (ref 80.0–100.0)
Platelets: 244 K/uL (ref 150–400)
RBC: 3.82 MIL/uL — ABNORMAL LOW (ref 3.87–5.11)
RDW: 19.8 % — ABNORMAL HIGH (ref 11.5–15.5)
WBC: 8.1 K/uL (ref 4.0–10.5)
nRBC: 0 % (ref 0.0–0.2)

## 2024-04-06 LAB — LIPASE, BLOOD: Lipase: 37 U/L (ref 11–51)

## 2024-04-06 LAB — HCG, SERUM, QUALITATIVE: Preg, Serum: NEGATIVE

## 2024-04-06 MED ORDER — FENTANYL CITRATE PF 50 MCG/ML IJ SOSY
50.0000 ug | PREFILLED_SYRINGE | Freq: Once | INTRAMUSCULAR | Status: AC
  Administered 2024-04-06: 50 ug via INTRAVENOUS
  Filled 2024-04-06: qty 1

## 2024-04-06 MED ORDER — ONDANSETRON HCL 4 MG/2ML IJ SOLN
4.0000 mg | Freq: Once | INTRAMUSCULAR | Status: AC
  Administered 2024-04-06: 4 mg via INTRAVENOUS
  Filled 2024-04-06: qty 2

## 2024-04-06 MED ORDER — CARVEDILOL 12.5 MG PO TABS
25.0000 mg | ORAL_TABLET | Freq: Two times a day (BID) | ORAL | Status: DC
  Administered 2024-04-06: 25 mg via ORAL
  Filled 2024-04-06: qty 2

## 2024-04-06 MED ORDER — ACETAMINOPHEN 500 MG PO TABS
1000.0000 mg | ORAL_TABLET | Freq: Once | ORAL | Status: AC
  Administered 2024-04-06: 1000 mg via ORAL
  Filled 2024-04-06: qty 2

## 2024-04-06 MED ORDER — ONDANSETRON HCL 4 MG PO TABS: 4.0000 mg | ORAL_TABLET | Freq: Four times a day (QID) | ORAL | 0 refills | Status: AC

## 2024-04-06 NOTE — ED Notes (Signed)
 Pt unable to provide UA at this time, states that she went to the RR before coming into triage.

## 2024-04-06 NOTE — Discharge Instructions (Addendum)
 Evaluation today was overall reassuring.  I sent Zofran  to your pharmacy for nausea and vomiting.  Please follow with your PCP.  As we discussed make sure that you do go to your dialysis appointment tomorrow.  If you develop worsening abdominal pain, fever, cannot tolerate fluid intake or any other concerning symptom please return to the ED for further evaluation.

## 2024-04-06 NOTE — ED Triage Notes (Signed)
 Patient reports low abdominal pain with emesis this evening , she adds lupus flare up with skin breakdown at left leg and right foot . She did not complete her hemodialysis treatment yesterday.

## 2024-04-06 NOTE — ED Notes (Signed)
 Patient returned from CT

## 2024-04-06 NOTE — ED Provider Notes (Signed)
 [place] [facility] Provider Note   CSN: [id number] Arrival date & time: 04/06/24  9675     Patient presents with: Abdominal Pain (Lupus Flare Up )  HPI Linda [name] is a 28 y.o. female with lupus, calciphylaxis, ESRD, CHF presenting for abdominal pain and vomiting.  Has been going for 2 days but worse yesterday evening.  Pain is primarily in the lower abdomen.  States she had a normal bowel movement 2 days ago.  Denies urinary symptoms. States that yesterday she did not fully complete her hemodialysis due to how she was feeling.  Denies chest pain shortness of breath at this time.  States she does still produce urine. Denies urinary symptoms.  Usually goes to dialysis Monday Wednesday Friday. Does report recent marijuana use.  Denies abnormal vaginal bleeding and discharge.  Recent admission for respiratory failure and intractable vomiting.  Mention that she did take her blood pressure medicine today but vomited shortly after.    Abdominal Pain      Prior to Admission medications   Medication Sig Start Date End Date Taking? Authorizing Provider  ondansetron  (ZOFRAN ) 4 MG tablet Take 1 tablet (4 mg total) by mouth every 6 (six) hours. 04/06/24  Yes Les Longmore K, PA-C  acetaminophen  (TYLENOL ) 325 MG tablet Take 2 tablets (650 mg total) by mouth every 6 (six) hours. 03/11/24   Elgergawy, Brayton RAMAN, MD  allopurinol  (ZYLOPRIM ) 100 MG tablet Take 1 tablet (100 mg total) by mouth daily. 03/11/24   Elgergawy, Brayton RAMAN, MD  amLODipine  (NORVASC ) 10 MG tablet Take 20 mg by mouth at bedtime. 08/05/21   [provider]  Blood Pressure KIT Uncontrolled blood pressure 03/11/24   Elgergawy, Brayton RAMAN, MD  Blood Pressure Monitor MISC Use to check blood pressure 03/11/24   Elgergawy, Brayton RAMAN, MD  carvedilol  (COREG ) 25 MG tablet Take 1 tablet (25 mg total) by mouth 2 (two) times daily with a meal. 03/30/24   [name], NP  cloNIDine  (CATAPRES )  0.1 MG tablet Take 1 tablet (0.1 mg total) by mouth daily. 03/11/24   Elgergawy, Brayton RAMAN, MD  furosemide  (LASIX ) 80 MG tablet Take 1 tablet (80 mg total) by mouth daily. Patient taking differently: Take 160 mg by mouth 2 (two) times daily. 12/23/23   Fairy Frames, MD  irbesartan  (AVAPRO ) 300 MG tablet Take 1 tablet (300 mg total) by mouth daily. 02/03/24   Walker, Caitlin S, NP  lidocaine -prilocaine  (EMLA ) cream Apply 1 Application topically daily as needed (for dialysis). 01/14/24   [provider]  medroxyPROGESTERone  (DEPO-PROVERA ) 150 MG/ML injection Inject 1 mL (150 mg total) into the muscle every 3 (three) months. 10/29/22   Rudy Carlin LABOR, MD  nitroGLYCERIN  (NITROSTAT ) 0.4 MG SL tablet Place 1 tablet (0.4 mg total) under the tongue every 5 (five) minutes as needed for chest pain. 10/28/23   Rai, Ripudeep MARLA, MD  ondansetron  (ZOFRAN -ODT) 4 MG disintegrating tablet Take 1 tablet (4 mg total) by mouth every 8 (eight) hours as needed for nausea or vomiting. Patient taking differently: Take 4 mg by mouth daily as needed for nausea or vomiting. 10/28/23   Rai, Nydia MARLA, MD  sevelamer  carbonate (RENVELA ) 2.4 g PACK Take 2.4 g by mouth daily. 01/21/24   [provider]  tiZANidine  (ZANAFLEX ) 4 MG tablet Take 4 mg by mouth daily as needed for muscle spasms. 12/03/23   [provider]    Allergies: Iodine , Lisinopril , Gabapentin, Ativan  [lorazepam ], Hydroxychloroquine , and Vicodin [  hydrocodone -acetaminophen ]    Review of Systems  Gastrointestinal:  Positive for abdominal pain.    Updated Vital Signs BP (!) 154/118   Pulse (!) 103   Temp 98 F (36.7 C)   Resp (!) 30   SpO2 95%   Physical Exam Vitals and nursing note reviewed.  HENT:     Head: Normocephalic and atraumatic.     Mouth/Throat:     Mouth: Mucous membranes are moist.  Eyes:     General:        Right eye: No discharge.        Left eye: No discharge.     Conjunctiva/sclera: Conjunctivae normal.   Cardiovascular:     Rate and Rhythm: Normal rate and regular rhythm.     Pulses: Normal pulses.     Heart sounds: Normal heart sounds.  Pulmonary:     Effort: Pulmonary effort is normal.     Breath sounds: Normal breath sounds.  Abdominal:     General: Abdomen is flat.     Palpations: Abdomen is soft.     Tenderness: There is abdominal tenderness in the right lower quadrant, suprapubic area and left lower quadrant.  Skin:    General: Skin is warm and dry.  Neurological:     General: No focal deficit present.  Psychiatric:        Mood and Affect: Mood normal.     (all labs ordered are listed, but only abnormal results are displayed) Labs Reviewed  COMPREHENSIVE METABOLIC PANEL WITH GFR - Abnormal; Notable for the following components:      Result Value   Chloride 96 (*)    BUN 33 (*)    Creatinine, Ser 7.67 (*)    Total Protein 9.1 (*)    Albumin  3.4 (*)    GFR, Estimated 7 (*)    All other components within normal limits  CBC - Abnormal; Notable for the following components:   RBC 3.82 (*)    Hemoglobin 11.0 (*)    RDW 19.8 (*)    All other components within normal limits  URINALYSIS, ROUTINE W REFLEX MICROSCOPIC - Abnormal; Notable for the following components:   APPearance HAZY (*)    Specific Gravity, Urine 1.004 (*)    pH 9.0 (*)    Protein, ur 100 (*)    Bacteria, UA RARE (*)    Non Squamous Epithelial 0-5 (*)    All other components within normal limits  LIPASE, BLOOD  HCG, SERUM, QUALITATIVE    EKG: None  Radiology: CT ABDOMEN PELVIS WO CONTRAST Result Date: 04/06/2024 EXAM: CT ABDOMEN AND PELVIS WITHOUT CONTRAST 04/06/2024 04:36:06 AM TECHNIQUE: CT of the abdomen and pelvis was performed without the administration of intravenous contrast. Multiplanar reformatted images are provided for review. Automated exposure control, iterative reconstruction, and/or weight based adjustment of the mA/kV was utilized to reduce the radiation dose to as low as reasonably  achievable. COMPARISON: CT renal stone protocol 03/06/2024. CLINICAL HISTORY: Abdominal pain, acute, nonlocalized. Patient reports low abdominal pain with emesis this evening, she adds lupus flare up with skin breakdown at left leg and right foot. She did not complete her hemodialysis treatment yesterday. FINDINGS: LOWER CHEST: Scattered ground-glass attenuation likely reflects atelectasis or edema. No significant effusions are present. LIVER: Hepatomegaly is stable. No discrete lesions are present. GALLBLADDER AND BILE DUCTS: Gallbladder is unremarkable. No biliary ductal dilatation. SPLEEN: No acute abnormality. PANCREAS: No acute abnormality. ADRENAL GLANDS: No acute abnormality. KIDNEYS, URETERS AND BLADDER: No stones in the  kidneys or ureters. No hydronephrosis. No perinephric or periureteral stranding. Urinary bladder is unremarkable. GI AND BOWEL: Stomach demonstrates no acute abnormality. There is no bowel obstruction. No bowel wall thickening. PERITONEUM AND RETROPERITONEUM: No ascites. No free air. VASCULATURE: Atherosclerotic calcifications are again noted within the aorta and branch vessels without aneurysm. LYMPH NODES: No lymphadenopathy. REPRODUCTIVE ORGANS: No acute abnormality. BONES AND SOFT TISSUES: Bilateral L5 pars defects are present with stable minimal grade 1 anterolisthesis at L5-S1. Scattered subcutaneous calcifications over the low back again noted. Larger clusters of calcifications in the posterior inferior gluteal regions are stable bilaterally. IMPRESSION: 1. No acute findings in the abdomen or pelvis related to the clinical history of abdominal pain. 2. Stable cardiomegaly with mild edema or atelectasis at the lung bases. 3. Other, non-acute and/or normal findings as above. Electronically signed by: Lonni Necessary MD 04/06/2024 04:45 AM EDT RP Workstation: [id number]   DG Chest Port 1 View Result Date: 04/06/2024 EXAM: 1 VIEW XRAY OF THE CHEST 04/06/2024 04:16:00 AM COMPARISON:  1 view chest x-ray 03/07/2024. CLINICAL HISTORY: Fluid status, did not complete dialysis yesterday. FINDINGS: LUNGS AND PLEURA: No focal pulmonary opacity. No pulmonary edema. No pleural effusion. No pneumothorax. HEART AND MEDIASTINUM: Cardiomegaly is stable. BONES AND SOFT TISSUES: No acute osseous abnormality. IMPRESSION: 1. No acute findings. 2. Stable cardiomegaly. Electronically signed by: Lonni Necessary MD 04/06/2024 04:29 AM EDT RP Workstation: [id number]     Procedures   Medications Ordered in the ED  carvedilol  (COREG ) tablet 25 mg (has no administration in time range)  ondansetron  (ZOFRAN ) injection 4 mg (4 mg Intravenous Given 04/06/24 0449)  fentaNYL  (SUBLIMAZE ) injection 50 mcg (50 mcg Intravenous Given 04/06/24 0450)  acetaminophen  (TYLENOL ) tablet 1,000 mg (1,000 mg Oral Given 04/06/24 0553)                                    Medical Decision Making Amount and/or Complexity of Data Reviewed Labs: ordered.   Initial Impression and Ddx 28 yo well appearing female presenting for abdominal pain. Exam notable for lower abdominal tenderness.  DDx includes appendicitis, ovarian torsion, ectopic pregnancy, diverticulitis, kidney stone, pyelonephritis, UTI, CHS, other. Patient PMH that increases complexity of ED encounter:   lupus, calciphylaxis, ESRD, CHF  Interpretation of Diagnostics - I independent reviewed and interpreted the labs as followed: no acute changes, potassium 4.8  - I independently visualized the following imaging with scope of interpretation limited to determining acute life threatening conditions related to emergency care: CT ab/pelvis, which revealed no acute findings  - I personally reviewed interpreted EKG which revealed sinus tachycardia  Patient Reassessment and Ultimate Disposition/Management On reassessment, symptoms improved.  Abdominal pain was persistent but marginally improved as well.  No vomiting during this encounter.  Blood pressure notably  elevated during this encounter.  Gave her home dose of Coreg .  Workup does not suggest volume overload or respiratory distress. Fluid challenge with no issue.  Recent marijuana use, suspect CHF.  Sent Zofran  to her pharmacy.  Advised her to get her dialysis appointment tomorrow and to follow-up with her PCP.  Discussed return precautions.  Discharged in good condition.  Patient management required discussion with the following services or consulting groups:  None  Complexity of Problems Addressed Acute complicated illness or Injury  Additional Data Reviewed and Analyzed Further history obtained from: Past medical history and medications listed in the EMR and Prior ED visit notes  Patient Encounter  Risk Assessment Consideration of hospitalization      Final diagnoses:  Abdominal pain, unspecified abdominal location    ED Discharge Orders          Ordered    ondansetron  (ZOFRAN ) 4 MG tablet  Every 6 hours        04/06/24 0601               Lang Norleen POUR, PA-C 04/06/24 0602    Trine Raynell Moder, MD 04/06/24 [phone]

## 2024-04-07 ENCOUNTER — Other Ambulatory Visit: Payer: Self-pay

## 2024-04-07 ENCOUNTER — Emergency Department (HOSPITAL_COMMUNITY)

## 2024-04-07 ENCOUNTER — Encounter (HOSPITAL_COMMUNITY): Payer: Self-pay

## 2024-04-07 ENCOUNTER — Emergency Department (HOSPITAL_COMMUNITY)
Admission: EM | Admit: 2024-04-07 | Discharge: 2024-04-07 | Disposition: A | Discharge status: Home / Self Care | Attending: Emergency Medicine | Admitting: Emergency Medicine

## 2024-04-07 DIAGNOSIS — N186 End stage renal disease: Secondary | ICD-10-CM | POA: Insufficient documentation

## 2024-04-07 DIAGNOSIS — M7989 Other specified soft tissue disorders: Secondary | ICD-10-CM | POA: Diagnosis not present

## 2024-04-07 DIAGNOSIS — M71122 Other infective bursitis, left elbow: Secondary | ICD-10-CM | POA: Diagnosis not present

## 2024-04-07 DIAGNOSIS — M25522 Pain in left elbow: Secondary | ICD-10-CM | POA: Diagnosis not present

## 2024-04-07 DIAGNOSIS — R0989 Other specified symptoms and signs involving the circulatory and respiratory systems: Secondary | ICD-10-CM | POA: Diagnosis not present

## 2024-04-07 LAB — CBC WITH DIFFERENTIAL/PLATELET
Abs Immature Granulocytes: 0.04 K/uL (ref 0.00–0.07)
Basophils Absolute: 0 K/uL (ref 0.0–0.1)
Basophils Relative: 0 %
Eosinophils Absolute: 0.3 K/uL (ref 0.0–0.5)
Eosinophils Relative: 3 %
HCT: 35.6 % — ABNORMAL LOW (ref 36.0–46.0)
Hemoglobin: 10.8 g/dL — ABNORMAL LOW (ref 12.0–15.0)
Immature Granulocytes: 1 %
Lymphocytes Relative: 17 %
Lymphs Abs: 1.5 K/uL (ref 0.7–4.0)
MCH: 28.7 pg (ref 26.0–34.0)
MCHC: 30.3 g/dL (ref 30.0–36.0)
MCV: 94.7 fL (ref 80.0–100.0)
Monocytes Absolute: 0.9 K/uL (ref 0.1–1.0)
Monocytes Relative: 10 %
Neutro Abs: 6.1 K/uL (ref 1.7–7.7)
Neutrophils Relative %: 69 %
Platelets: 248 K/uL (ref 150–400)
RBC: 3.76 MIL/uL — ABNORMAL LOW (ref 3.87–5.11)
RDW: 19.6 % — ABNORMAL HIGH (ref 11.5–15.5)
WBC: 8.8 K/uL (ref 4.0–10.5)
nRBC: 0 % (ref 0.0–0.2)

## 2024-04-07 LAB — COMPREHENSIVE METABOLIC PANEL WITH GFR
ALT: 19 U/L (ref 0–44)
AST: 26 U/L (ref 15–41)
Albumin: 3.4 g/dL — ABNORMAL LOW (ref 3.5–5.0)
Alkaline Phosphatase: 104 U/L (ref 38–126)
Anion gap: 15 (ref 5–15)
BUN: 19 mg/dL (ref 6–20)
CO2: 27 mmol/L (ref 22–32)
Calcium: 9.5 mg/dL (ref 8.9–10.3)
Chloride: 95 mmol/L — ABNORMAL LOW (ref 98–111)
Creatinine, Ser: 5.38 mg/dL — ABNORMAL HIGH (ref 0.44–1.00)
GFR, Estimated: 10 mL/min — ABNORMAL LOW (ref >60)
Glucose, Bld: 81 mg/dL (ref 70–99)
Potassium: 3.9 mmol/L (ref 3.5–5.1)
Sodium: 137 mmol/L (ref 135–145)
Total Bilirubin: 0.8 mg/dL (ref 0.0–1.2)
Total Protein: 9.2 g/dL — ABNORMAL HIGH (ref 6.5–8.1)

## 2024-04-07 LAB — I-STAT CG4 LACTIC ACID, ED: Lactic Acid, Venous: 1.2 mmol/L (ref 0.5–1.9)

## 2024-04-07 LAB — URIC ACID: Uric Acid, Serum: 3.2 mg/dL (ref 2.5–7.1)

## 2024-04-07 LAB — C-REACTIVE PROTEIN: CRP: 5.5 mg/dL — ABNORMAL HIGH (ref <1.0)

## 2024-04-07 LAB — HCG, SERUM, QUALITATIVE: Preg, Serum: NEGATIVE

## 2024-04-07 LAB — SEDIMENTATION RATE: Sed Rate: 86 mm/h — ABNORMAL HIGH (ref 0–22)

## 2024-04-07 LAB — PROTIME-INR
INR: 1.1 (ref 0.8–1.2)
Prothrombin Time: 15.1 s (ref 11.4–15.2)

## 2024-04-07 MED ORDER — CLINDAMYCIN HCL 300 MG PO CAPS: 300.0000 mg | ORAL_CAPSULE | Freq: Four times a day (QID) | ORAL | 0 refills | Status: AC

## 2024-04-07 MED ORDER — HYDROMORPHONE HCL 1 MG/ML IJ SOLN
1.0000 mg | Freq: Once | INTRAMUSCULAR | Status: AC
  Administered 2024-04-07: 1 mg via SUBCUTANEOUS
  Filled 2024-04-07: qty 1

## 2024-04-07 MED ORDER — OXYCODONE HCL 5 MG PO TABS: 5.0000 mg | ORAL_TABLET | ORAL | Status: DC

## 2024-04-07 MED ORDER — OXYCODONE HCL 5 MG PO TABS: 5.0000 mg | ORAL_TABLET | ORAL | 0 refills | Status: DC | PRN

## 2024-04-07 MED ORDER — CLINDAMYCIN HCL 150 MG PO CAPS
300.0000 mg | ORAL_CAPSULE | ORAL | Status: AC
  Administered 2024-04-07: 300 mg via ORAL
  Filled 2024-04-07: qty 2

## 2024-04-07 NOTE — ED Notes (Signed)
 Patient transported to vascular.

## 2024-04-07 NOTE — ED Triage Notes (Signed)
 Pt states her arm with her dialysis shunt in hot to the touch and painful, she was here yesterday for the same. Feels hot to the touch. Hurts to move her left arm.

## 2024-04-07 NOTE — Discharge Instructions (Signed)
 You were seen for your elbow pain in the emergency department.  You likely have bursitis.  At home, please take the antibiotics in case it is infected.  Take Tylenol  and use over-the-counter topical analgesics such as IcyHot for the pain. You may also take the oxycodone  we have prescribed you for any breakthrough pain that may have.  Do not take this before driving or operating heavy machinery.  Do not take this medication with alcohol.  Check your MyChart online for the results of any tests that had not resulted by the time you left the emergency department.   Follow-up with your primary doctor or sports medicine in 5-7 days regarding your visit.    Return immediately to the emergency department if you experience any of the following: Worsening pain, fevers, or any other concerning symptoms.    Thank you for visiting our Emergency Department. It was a pleasure taking care of you today.

## 2024-04-07 NOTE — ED Provider Triage Note (Signed)
 Emergency Medicine Provider Triage Evaluation Note  Linda [name] , a 28 y.o. female  was evaluated in triage.  Pt complains of left arm pain, warmth since last night.  Dialysis MWF. Had full treatment today  Review of Systems  Positive: hpi Negative: Fevers, chills  Physical Exam  BP (!) 165/122 (BP Location: Right Arm)   Pulse 100   Temp 99.1 F (37.3 C) (Oral)   Resp 20   Ht 5' 6 (1.676 m)   Wt 75.8 kg   SpO2 95%   BMI 26.95 kg/m  Gen:   Awake, no distress   Resp:  Normal effort  MSK:   Moves extremities without difficulty  Other:  Shunt to LUE  Medical Decision Making  Medically screening exam initiated at 12:19 PM.  Appropriate orders placed.  Linda [name] was informed that the remainder of the evaluation will be completed by another provider, this initial triage assessment does not replace that evaluation, and the importance of remaining in the ED until their evaluation is complete.  US  and labs ordered   Linda [name], Linda [name] 04/07/24 1224

## 2024-04-07 NOTE — ED Provider Notes (Signed)
 [place] EMERGENCY DEPARTMENT AT [facility] Provider Note   CSN: [id number] Arrival date & time: 04/07/24  1151     Patient presents with: Vascular Access Problem   Linda [name] is a 28 y.o. female.   28 year old female with history of lupus, ESRD on IHD, and gout who presents to the emergency department with left elbow pain.  Patient reports that yesterday she started having pain in her left elbow.  Hurts to range her elbow.  Says that it is red and warm.  No subjective fevers or chills otherwise.  Triage reports that her dialysis shunt was painful but she denies this to me and when I asked her about it she says that they were having some issues at dialysis with the machine clogging but she has not had any pain or redness of her dialysis shunt.        Prior to Admission medications   Medication Sig Start Date End Date Taking? Authorizing Provider  clindamycin  (CLEOCIN ) 300 MG capsule Take 1 capsule (300 mg total) by mouth every 6 (six) hours for 14 days. 04/07/24 04/21/24 Yes Yolande Lamar BROCKS, MD  oxyCODONE  (ROXICODONE ) 5 MG immediate release tablet Take 1 tablet (5 mg total) by mouth every 4 (four) hours as needed for severe pain (pain score 7-10). 04/07/24  Yes Yolande Lamar BROCKS, MD  acetaminophen  (TYLENOL ) 325 MG tablet Take 2 tablets (650 mg total) by mouth every 6 (six) hours. 03/11/24   Elgergawy, Brayton RAMAN, MD  allopurinol  (ZYLOPRIM ) 100 MG tablet Take 1 tablet (100 mg total) by mouth daily. 03/11/24   Elgergawy, Brayton RAMAN, MD  amLODipine  (NORVASC ) 10 MG tablet Take 20 mg by mouth at bedtime. 08/05/21   [provider]  Blood Pressure KIT Uncontrolled blood pressure 03/11/24   Elgergawy, Brayton RAMAN, MD  Blood Pressure Monitor MISC Use to check blood pressure 03/11/24   Elgergawy, Brayton RAMAN, MD  carvedilol  (COREG ) 25 MG tablet Take 1 tablet (25 mg total) by mouth 2 (two) times daily with a meal. 03/30/24   [name], NP  cloNIDine  (CATAPRES ) 0.1 MG  tablet Take 1 tablet (0.1 mg total) by mouth daily. 03/11/24   Elgergawy, Brayton RAMAN, MD  furosemide  (LASIX ) 80 MG tablet Take 1 tablet (80 mg total) by mouth daily. Patient taking differently: Take 160 mg by mouth 2 (two) times daily. 12/23/23   Fairy Frames, MD  irbesartan  (AVAPRO ) 300 MG tablet Take 1 tablet (300 mg total) by mouth daily. 02/03/24   Walker, Caitlin S, NP  lidocaine -prilocaine  (EMLA ) cream Apply 1 Application topically daily as needed (for dialysis). 01/14/24   [provider]  medroxyPROGESTERone  (DEPO-PROVERA ) 150 MG/ML injection Inject 1 mL (150 mg total) into the muscle every 3 (three) months. 10/29/22   Rudy Carlin LABOR, MD  nitroGLYCERIN  (NITROSTAT ) 0.4 MG SL tablet Place 1 tablet (0.4 mg total) under the tongue every 5 (five) minutes as needed for chest pain. 10/28/23   Rai, Nydia POUR, MD  ondansetron  (ZOFRAN ) 4 MG tablet Take 1 tablet (4 mg total) by mouth every 6 (six) hours. 04/06/24   Robinson, John K, PA-C  ondansetron  (ZOFRAN -ODT) 4 MG disintegrating tablet Take 1 tablet (4 mg total) by mouth every 8 (eight) hours as needed for nausea or vomiting. Patient taking differently: Take 4 mg by mouth daily as needed for nausea or vomiting. 10/28/23   Rai, Nydia POUR, MD  sevelamer  carbonate (RENVELA ) 2.4 g PACK Take 2.4 g by mouth daily. 01/21/24  [provider]  tiZANidine  (ZANAFLEX ) 4 MG tablet Take 4 mg by mouth daily as needed for muscle spasms. 12/03/23   [provider]    Allergies: Iodine , Lisinopril , Gabapentin, Ativan  [lorazepam ], Hydroxychloroquine , and Vicodin [hydrocodone -acetaminophen ]    Review of Systems  Updated Vital Signs BP (!) 155/99   Pulse 98   Temp 99 F (37.2 C)   Resp 14   Ht 5' 6 (1.676 m)   Wt 75.8 kg   SpO2 99%   BMI 26.95 kg/m   Physical Exam Constitutional:      Appearance: Normal appearance.  Cardiovascular:     Comments: Fistula in left upper extremity with bruit and thrill.  No overlying erythema or  warmth.  No induration.   Musculoskeletal:     Comments: No tenderness to palpation of the upper extremity where the fistula is located.  L elbow swelling with erythema and warmth.  Patient is still able to fully extend the elbow and flex it to 90 degrees.  Tophi of L forearm.   Neurological:     Mental Status: She is alert.     (all labs ordered are listed, but only abnormal results are displayed) Labs Reviewed  COMPREHENSIVE METABOLIC PANEL WITH GFR - Abnormal; Notable for the following components:      Result Value   Chloride 95 (*)    Creatinine, Ser 5.38 (*)    Total Protein 9.2 (*)    Albumin  3.4 (*)    GFR, Estimated 10 (*)    All other components within normal limits  CBC WITH DIFFERENTIAL/PLATELET - Abnormal; Notable for the following components:   RBC 3.76 (*)    Hemoglobin 10.8 (*)    HCT 35.6 (*)    RDW 19.6 (*)    All other components within normal limits  SEDIMENTATION RATE - Abnormal; Notable for the following components:   Sed Rate 86 (*)    All other components within normal limits  C-REACTIVE PROTEIN - Abnormal; Notable for the following components:   CRP 5.5 (*)    All other components within normal limits  CULTURE, BLOOD (ROUTINE X 2)  PROTIME-INR  HCG, SERUM, QUALITATIVE  URIC ACID  I-STAT CG4 LACTIC ACID, ED    EKG: None  Radiology: DG Elbow Complete Left Result Date: 04/07/2024 CLINICAL DATA:  L elbow pain and swelling. History of end-stage renal disease and hemodialysis with left upper extremity AV graft. EXAM: LEFT ELBOW - COMPLETE 3+ VIEW COMPARISON:  None Available. FINDINGS: No acute fracture or dislocation. No joint effusion. There is no evidence of arthropathy or other focal bone abnormality. Likely AV graft along the anterior aspect of the distal humerus. Multiple lobular calcifications in the subcutaneous tissues of the arm, likely related to patient's end-stage renal disease. IMPRESSION: 1. No acute fracture or dislocation. 2. Lobular  calcifications scattered throughout the subcutaneous tissues of the arm, likely related to patient's end-stage renal disease or hyperparathyroidism. Electronically Signed   By: Rogelia Myers M.D.   On: 04/07/2024 15:16   DG Chest 2 View if patient is not in a treatment room. Result Date: 04/07/2024 CLINICAL DATA:  Suspected Sepsis. EXAM: CHEST - 2 VIEW COMPARISON:  04/06/2024. FINDINGS: Low lung volume. Mild central pulmonary vascular congestion. No frank pulmonary edema. Bilateral lung fields are otherwise clear. Bilateral costophrenic angles are clear. Stable moderately enlarged cardio-mediastinal silhouette. No acute osseous abnormalities. The soft tissues are within normal limits. IMPRESSION: *Mild central pulmonary vascular congestion. No frank pulmonary edema. Electronically Signed  By: Ree Molt M.D.   On: 04/07/2024 12:56     Procedures  EMERGENCY DEPARTMENT US  MSK INTERPRETATION Study: Limited Soft Tissue Ultrasound  INDICATIONS: L elbow pain Multiple views of the body part were obtained in real-time with a multi-frequency linear probe  PERFORMED BY: Myself IMAGES ARCHIVED?: No SIDE:Left BODY PART:elbow INTERPRETATION:  cobblestoning but no significant effusion noted of elbow joint    Medications Ordered in the ED  clindamycin  (CLEOCIN ) capsule 300 mg (300 mg Oral Given 04/07/24 1517)  HYDROmorphone  (DILAUDID ) injection 1 mg (1 mg Subcutaneous Given 04/07/24 1517)                                    Medical Decision Making Amount and/or Complexity of Data Reviewed Labs: ordered. Radiology: ordered.  Risk Prescription drug management.   28 year old female with history of lupus, ESRD on IHD, and gout who presents to the emergency department with left elbow pain.    Initial Ddx:  Bursitis, septic bursitis, lupus flare, septic joint, gout, fracture   MDM/Course:  Pt presents with several days of atraumatic L elbow pain. Denies any hx of gout to me but is  prescribed allopurinol  and does appear to have tophi on her arms. Does have significant swelling of the elbow but still has good range of motion.  There are some overlying erythema.  She had an ultrasound does not show significant joint effusion.  X-ray without fracture or secondary signs of joint effusion either but does show multiple areas of calcification in the arm.  Suspect the patient has bursitis/that because she still has full range of motion and no evidence of the joint effusion.  With erythema and elevated inflammatory markers we will go ahead and prescribe antibiotics in case of septic bursitis and give her pain medication.  This patient presents to the ED for concern of complaints listed in HPI, this involves an extensive number of treatment options, and is a complaint that carries with it a high risk of complications and morbidity. Disposition including potential need for admission considered.   Dispo: DC Home. Return precautions discussed including, but not limited to, those listed in the AVS. Allowed pt time to ask questions which were answered fully prior to dc.  Records reviewed Outpatient Clinic Notes The following labs were independently interpreted: Chemistry and show CKD I independently reviewed the following imaging with scope of interpretation limited to determining acute life threatening conditions related to emergency care: Extremity x-ray(s) and agree with the radiologist interpretation with the following exceptions: none I have reviewed the patients home medications and made adjustments as needed  Portions of this note were generated with Dragon dictation software. Dictation errors may occur despite best attempts at proofreading.     Final diagnoses:  Septic bursitis of elbow, left    ED Discharge Orders          Ordered    clindamycin  (CLEOCIN ) 300 MG capsule  Every 6 hours        04/07/24 1454    oxyCODONE  (ROXICODONE ) 5 MG immediate release tablet  Every 4 hours  PRN        04/07/24 1514               Yolande Lamar BROCKS, MD 04/08/24 1755

## 2024-04-10 DIAGNOSIS — Z992 Dependence on renal dialysis: Secondary | ICD-10-CM | POA: Diagnosis not present

## 2024-04-12 DIAGNOSIS — D509 Iron deficiency anemia, unspecified: Secondary | ICD-10-CM | POA: Diagnosis not present

## 2024-04-12 LAB — CULTURE, BLOOD (ROUTINE X 2): Culture: NO GROWTH

## 2024-04-14 DIAGNOSIS — N2581 Secondary hyperparathyroidism of renal origin: Secondary | ICD-10-CM | POA: Diagnosis not present

## 2024-04-19 DIAGNOSIS — R110 Nausea: Secondary | ICD-10-CM | POA: Diagnosis not present

## 2024-04-25 ENCOUNTER — Telehealth: Payer: Self-pay | Admitting: Family Medicine

## 2024-04-25 NOTE — Telephone Encounter (Signed)
 Copied from CRM #[id number]. Topic: Referral - Question >> Apr 25, 2024  2:06 PM Pinkey ORN wrote: Reason for CRM: Referral >> Apr 25, 2024  2:08 PM Pinkey ORN wrote: Patient states she hasn't been able to get a hold of anyone over at [facility] and is requesting to be referred elsewhere. Patient states she doesn't want to be referred to [facility]. Please follow up with patient at [phone]

## 2024-04-25 NOTE — Telephone Encounter (Signed)
 Called pt and told pt I would call around and look who could take her

## 2024-04-26 DIAGNOSIS — D631 Anemia in chronic kidney disease: Secondary | ICD-10-CM | POA: Diagnosis not present

## 2024-04-27 ENCOUNTER — Other Ambulatory Visit: Payer: Self-pay

## 2024-04-28 ENCOUNTER — Encounter (HOSPITAL_COMMUNITY): Payer: Self-pay

## 2024-04-28 ENCOUNTER — Other Ambulatory Visit: Payer: Self-pay

## 2024-04-28 ENCOUNTER — Emergency Department (HOSPITAL_COMMUNITY)

## 2024-04-28 ENCOUNTER — Emergency Department (HOSPITAL_COMMUNITY)
Admission: EM | Admit: 2024-04-28 | Discharge: 2024-04-28 | Discharge status: Against Medical Advice | Attending: Emergency Medicine | Admitting: Emergency Medicine

## 2024-04-28 DIAGNOSIS — M545 Low back pain, unspecified: Secondary | ICD-10-CM | POA: Diagnosis not present

## 2024-04-28 DIAGNOSIS — R0602 Shortness of breath: Secondary | ICD-10-CM | POA: Insufficient documentation

## 2024-04-28 DIAGNOSIS — R109 Unspecified abdominal pain: Secondary | ICD-10-CM | POA: Diagnosis not present

## 2024-04-28 DIAGNOSIS — Z992 Dependence on renal dialysis: Secondary | ICD-10-CM | POA: Diagnosis not present

## 2024-04-28 DIAGNOSIS — N186 End stage renal disease: Secondary | ICD-10-CM | POA: Insufficient documentation

## 2024-04-28 DIAGNOSIS — K5900 Constipation, unspecified: Secondary | ICD-10-CM | POA: Diagnosis not present

## 2024-04-28 DIAGNOSIS — Z4931 Encounter for adequacy testing for hemodialysis: Secondary | ICD-10-CM | POA: Diagnosis not present

## 2024-04-28 DIAGNOSIS — N250 Renal osteodystrophy: Secondary | ICD-10-CM | POA: Diagnosis not present

## 2024-04-28 DIAGNOSIS — R059 Cough, unspecified: Secondary | ICD-10-CM | POA: Diagnosis not present

## 2024-04-28 DIAGNOSIS — D631 Anemia in chronic kidney disease: Secondary | ICD-10-CM | POA: Diagnosis not present

## 2024-04-28 DIAGNOSIS — I120 Hypertensive chronic kidney disease with stage 5 chronic kidney disease or end stage renal disease: Secondary | ICD-10-CM | POA: Diagnosis not present

## 2024-04-28 LAB — BASIC METABOLIC PANEL WITH GFR
Anion gap: 19 — ABNORMAL HIGH (ref 5–15)
BUN: 45 mg/dL — ABNORMAL HIGH (ref 6–20)
CO2: 23 mmol/L (ref 22–32)
Calcium: 9.7 mg/dL (ref 8.9–10.3)
Chloride: 92 mmol/L — ABNORMAL LOW (ref 98–111)
Creatinine, Ser: 9.96 mg/dL — ABNORMAL HIGH (ref 0.44–1.00)
GFR, Estimated: 5 mL/min — ABNORMAL LOW (ref >60)
Glucose, Bld: 76 mg/dL (ref 70–99)
Potassium: 5.6 mmol/L — ABNORMAL HIGH (ref 3.5–5.1)
Sodium: 134 mmol/L — ABNORMAL LOW (ref 135–145)

## 2024-04-28 LAB — CBC
HCT: 37 % (ref 36.0–46.0)
Hemoglobin: 11.4 g/dL — ABNORMAL LOW (ref 12.0–15.0)
MCH: 29.1 pg (ref 26.0–34.0)
MCHC: 30.8 g/dL (ref 30.0–36.0)
MCV: 94.4 fL (ref 80.0–100.0)
Platelets: 190 K/uL (ref 150–400)
RBC: 3.92 MIL/uL (ref 3.87–5.11)
RDW: 19.8 % — ABNORMAL HIGH (ref 11.5–15.5)
WBC: 7.3 K/uL (ref 4.0–10.5)
nRBC: 0 % (ref 0.0–0.2)

## 2024-04-28 LAB — HCG, SERUM, QUALITATIVE: Preg, Serum: NEGATIVE

## 2024-04-28 LAB — HEPATITIS B SURFACE ANTIGEN: Hepatitis B Surface Ag: NONREACTIVE

## 2024-04-28 MED ORDER — ANTICOAGULANT SODIUM CITRATE 4% (200MG/5ML) IV SOLN
5.0000 mL | Status: DC | PRN
Start: 2024-04-28 — End: 2024-04-28

## 2024-04-28 MED ORDER — OXYCODONE-ACETAMINOPHEN 5-325 MG PO TABS
1.0000 | ORAL_TABLET | Freq: Once | ORAL | Status: DC
  Filled 2024-04-28: qty 1

## 2024-04-28 MED ORDER — CHLORHEXIDINE GLUCONATE CLOTH 2 % EX PADS: 6.0000 | MEDICATED_PAD | Freq: Every day | CUTANEOUS | Status: DC

## 2024-04-28 MED ORDER — HEPARIN SODIUM (PORCINE) 1000 UNIT/ML DIALYSIS
3000.0000 [IU] | INTRAMUSCULAR | Status: DC | PRN
  Administered 2024-04-28: 3000 [IU] via INTRAVENOUS_CENTRAL
  Filled 2024-04-28: qty 3

## 2024-04-28 MED ORDER — LIDOCAINE 5 % EX PTCH
1.0000 | MEDICATED_PATCH | CUTANEOUS | Status: DC
  Filled 2024-04-28: qty 1

## 2024-04-28 MED ORDER — OXYCODONE HCL 5 MG PO TABS: 5.0000 mg | ORAL_TABLET | Freq: Once | ORAL | Status: DC

## 2024-04-28 MED ORDER — FENTANYL CITRATE PF 50 MCG/ML IJ SOSY
50.0000 ug | PREFILLED_SYRINGE | Freq: Once | INTRAMUSCULAR | Status: AC
  Administered 2024-04-28: 50 ug via INTRAVENOUS
  Filled 2024-04-28: qty 1

## 2024-04-28 MED ORDER — PENTAFLUOROPROP-TETRAFLUOROETH EX AERO: 1.0000 | INHALATION_SPRAY | CUTANEOUS | Status: DC | PRN

## 2024-04-28 MED ORDER — LIDOCAINE HCL (PF) 1 % IJ SOLN: 5.0000 mL | INTRAMUSCULAR | Status: DC | PRN

## 2024-04-28 MED ORDER — HEPARIN SODIUM (PORCINE) 1000 UNIT/ML IJ SOLN
INTRAMUSCULAR | Status: AC
  Filled 2024-04-28: qty 3

## 2024-04-28 MED ORDER — OXYCODONE HCL 5 MG PO TABS
10.0000 mg | ORAL_TABLET | Freq: Once | ORAL | Status: AC
  Administered 2024-04-28: 10 mg via ORAL
  Filled 2024-04-28: qty 2

## 2024-04-28 MED ORDER — LIDOCAINE-PRILOCAINE 2.5-2.5 % EX CREA: 1.0000 | TOPICAL_CREAM | CUTANEOUS | Status: DC | PRN

## 2024-04-28 MED ORDER — ALTEPLASE 2 MG IJ SOLR: 2.0000 mg | Freq: Once | INTRAMUSCULAR | Status: DC | PRN

## 2024-04-28 MED ORDER — HEPARIN SODIUM (PORCINE) 1000 UNIT/ML DIALYSIS
1000.0000 [IU] | INTRAMUSCULAR | Status: DC | PRN
Start: 2024-04-28 — End: 2024-04-28

## 2024-04-28 NOTE — ED Notes (Signed)
 Pt to dialysis with LPN.

## 2024-04-28 NOTE — ED Provider Notes (Addendum)
 Patient returns emergency department from dialysis.  After returning from dialysis the patient complained of a headache and nursing requested reassessment.  I went to check on the patient she is alert and without distress.  She reports she wants medication for her back pain.  She reports she thinks the problem is her kidney.  Patient reports that often after dialysis she has a headache and typically she just goes home and rests.  She does not qualify this headache is being different.  She is more focused on back pain.  Indicating pain in the left flank area which she believes is her kidney. Physical Exam  BP (!) 170/125   Pulse 88   Temp 97.9 F (36.6 C) (Oral)   Resp 20   Ht 5' 6 (1.676 m)   Wt 68 kg Comment: dry weight per pt  SpO2 100%   BMI 24.20 kg/m   Physical Exam Constitutional:      Comments: Patient is alert with clear mental status.  No respiratory distress.  Eyes:     Extraocular Movements: Extraocular movements intact.  Pulmonary:     Effort: Pulmonary effort is normal.  Neurological:     General: No focal deficit present.     Mental Status: She is alert.     Procedures  Procedures  ED Course / MDM    Medical Decision Making Amount and/or Complexity of Data Reviewed Labs: ordered. Radiology: ordered.  Risk Prescription drug management.   After assessing the patient's visit w/u, she is significantly hypertensive with severe diastolic hypertension.  The patient had a CT scan assessing for her flank pain.  No acute findings identified.  Patient reports that she usually takes her blood pressure medications after her dialysis.  At this time my plan was to administer blood pressure medications and extra strength Tylenol .  Patient reported that Tylenol  would not help her pain and she wanted a stronger pain medicine.  I counseled that at this time my plan was to treat with her antihypertensives and see if this improved the symptoms and give nonnarcotic pain medication at  this time.  After I left, she told the nurse that it would not work and left AMA without signing AMA papers or waiting to talk to me again.       Armenta Canning, MD 04/28/24 TISH    Armenta Canning, MD 04/28/24 2030

## 2024-04-28 NOTE — ED Notes (Signed)
 After visit with MD pt decided to leave AMA. Pt states I want something stronger then tylenol . This RN tried to explained the reasoning for tylenol  being the selected pain medication. After several unsuccessful attempts.pt removed nasal cannula and proceeded to pack her things and leave department.  Pt refused to sign AMA form. Md Notified

## 2024-04-28 NOTE — Progress Notes (Addendum)
 Received patient in bed to unit.  Alert and oriented.  Informed consent signed and in chart.   TX duration: 3 hours and 30 minutes.  Informed Dr. Melia of High blood pressure, Dr. Melia says that he is familiar with patient and that her BP is normally high when she get BP through ER.    I asked patient if she has BP meds at home and she will take at home.  Patient tolerated well.  Transported back to the room  Alert, without acute distress.  Hand-off given to patient's nurse.   Access used: Left Upper Arm graft Access issues: none  Total UF removed: 3L Medication(s) given: none   04/28/24 1757  Vitals  Temp 97.9 F (36.6 C)  Temp Source Oral  BP (!) 196/140  MAP (mmHg) 156  ECG Heart Rate 74  Resp (!) 33  Oxygen Therapy  SpO2 100 %  O2 Device Nasal Cannula  O2 Flow Rate (L/min) 2 L/min  During Treatment Monitoring  Duration of HD Treatment -hour(s) 3.5 hour(s)  HD Safety Checks Performed Yes  Intra-Hemodialysis Comments Tx completed  Post Treatment  Dialyzer Clearance Lightly streaked  Liters Processed 84  Fluid Removed (mL) 3000 mL  Tolerated HD Treatment Yes  Post-Hemodialysis Comments High BP, informed Dr. Melia and he is informed.  AVG/AVF Arterial Site Held (minutes) 7 minutes  AVG/AVF Venous Site Held (minutes) 7 minutes  Fistula / Graft Left Upper arm  No placement date or time found.   Orientation: Left  Access Location: Upper arm  Status Deaccessed     Camellia Brasil LPN [facility]

## 2024-04-28 NOTE — Consult Note (Signed)
 [facility] Renal Consultation Note    Indication for Consultation:  Management of ESRD/hemodialysis; anemia, hypertension/volume and secondary hyperparathyroidism   HPI: Linda [name] is a 28 y.o. female with ESRD on HD MWF, lupus, MCTD who presented to the ED with left lower back pain and shortness of breath. No acute findings on CT Abd. Labs Na 134, K 5.6, BUN 45. Nephrology consulted for dialysis.   She did have a complete dialysis treatment on Wednesday. Left about 1kg over her dry weight. She was sob after that treatment.  Seen and examined in the ED. Uncontrolled hypertension which is not unusual for her.  She says back pain started about 3 days ago. She denies chest pain, abd pain, nausea/vomiting.   Past Medical History:  Diagnosis Date   Abnormal ECG    a. In setting of LVH w/ repolarization abnormalities; b. 03/2019 MV: No ischemia/infarct. EF 55-65%.   Angioedema    ESRD on hemodialysis (HCC)    GERD (gastroesophageal reflux disease)    Hypertension    Lupus    LVH (left ventricular hypertrophy)    a. 11/2017 Echo: EF 50-55%. triv AI. Mild MR. Mod L and mild R atrial enlargement. Nl RV size/fxn. Small pericardial effusion w/o tampondae; b. 03/2018 Echo: EF 55-60%, restrictive filling pattern. Nl RV size/fxn. Sev LAE. Mild MR, mild to mod TR/PR. Mod PAH. Triv effusion.   Migraines    Mixed connective tissue disease (HCC)    Previous Dialysis patient [facility])    a. Off HD since late 2020.   Septic prepatellar bursitis of right knee 08/19/2017   Past Surgical History:  Procedure Laterality Date   A/V FISTULAGRAM N/A 03/10/2024   Procedure: A/V Fistulagram;  Surgeon: Serene Gaile ORN, MD;  Location: [facility] INVASIVE CV LAB;  Service: Cardiovascular;  Laterality: N/A;   GRAFT APPLICATION Left 03/30/2019   Procedure: BRACHIAL CEPHALIC GRAFT CREATION;  Surgeon: Marea Selinda RAMAN, MD;  Location: [facility] ORS;  Service: Vascular;  Laterality: Left;   I & D EXTREMITY Right  08/06/2017   Procedure: IRRIGATION AND DEBRIDEMENT KNEE;  Surgeon: Kendal Franky SQUIBB, MD;  Location: [facility] OR;  Service: Orthopedics;  Laterality: Right;   IR THROMBECTOMY AV FISTULA W/THROMBOLYSIS/PTA/STENT INC/SHUNT/IMG LT Left 05/18/2022   IR US  GUIDE VASC ACCESS LEFT  05/18/2022   MULTIPLE TOOTH EXTRACTIONS     Family History  Problem Relation Age of Onset   Lupus Mother    Hypertension Mother    Kidney disease Mother    Heart Problems Mother    Coronary artery disease Mother 38       stent   Diabetes Maternal Grandmother    Social History:  reports that she has been smoking e-cigarettes and cigarettes. She started smoking about 9 years ago. She has a 2.3 pack-year smoking history. She has never used smokeless tobacco. She reports that she does not drink alcohol and does not use drugs. Allergies  Allergen Reactions   Iodine  Hives and Shortness Of Breath   Lisinopril  Anaphylaxis    Angioedema   Gabapentin Other (See Comments)    Reaction: Tremor (intolerance); tremor   Ativan  [Lorazepam ] Other (See Comments)    Somnolent with 1mg  ativan  at [facility] Nov 2019. Patient stated it made her feel like she shut down and was mute and non-responding.   Hydroxychloroquine  Other (See Comments)    Pt reports making her skin peel really bad   Vicodin [Hydrocodone -Acetaminophen ] Itching and Rash   Prior to Admission medications   Medication  Sig Start Date End Date Taking? Authorizing Provider  acetaminophen  (TYLENOL ) 325 MG tablet Take 2 tablets (650 mg total) by mouth every 6 (six) hours. 03/11/24   Elgergawy, Brayton RAMAN, MD  allopurinol  (ZYLOPRIM ) 100 MG tablet Take 1 tablet (100 mg total) by mouth daily. 03/11/24   Elgergawy, Brayton RAMAN, MD  amLODipine  (NORVASC ) 10 MG tablet Take 20 mg by mouth at bedtime. 08/05/21   [provider]  Blood Pressure KIT Uncontrolled blood pressure 03/11/24   Elgergawy, Brayton RAMAN, MD  Blood Pressure Monitor MISC Use to check blood pressure 03/11/24   Elgergawy,  Brayton RAMAN, MD  carvedilol  (COREG ) 25 MG tablet Take 1 tablet (25 mg total) by mouth 2 (two) times daily with a meal. 03/30/24   [name], NP  cloNIDine  (CATAPRES ) 0.1 MG tablet Take 1 tablet (0.1 mg total) by mouth daily. 03/11/24   Elgergawy, Brayton RAMAN, MD  furosemide  (LASIX ) 80 MG tablet Take 1 tablet (80 mg total) by mouth daily. Patient taking differently: Take 160 mg by mouth 2 (two) times daily. 12/23/23   Fairy Frames, MD  irbesartan  (AVAPRO ) 300 MG tablet Take 1 tablet (300 mg total) by mouth daily. 02/03/24   Walker, Caitlin S, NP  lidocaine -prilocaine  (EMLA ) cream Apply 1 Application topically daily as needed (for dialysis). 01/14/24   [provider]  medroxyPROGESTERone  (DEPO-PROVERA ) 150 MG/ML injection Inject 1 mL (150 mg total) into the muscle every 3 (three) months. 10/29/22   Rudy Carlin LABOR, MD  nitroGLYCERIN  (NITROSTAT ) 0.4 MG SL tablet Place 1 tablet (0.4 mg total) under the tongue every 5 (five) minutes as needed for chest pain. 10/28/23   Rai, Nydia POUR, MD  ondansetron  (ZOFRAN ) 4 MG tablet Take 1 tablet (4 mg total) by mouth every 6 (six) hours. 04/06/24   Robinson, John K, PA-C  ondansetron  (ZOFRAN -ODT) 4 MG disintegrating tablet Take 1 tablet (4 mg total) by mouth every 8 (eight) hours as needed for nausea or vomiting. Patient taking differently: Take 4 mg by mouth daily as needed for nausea or vomiting. 10/28/23   Rai, Nydia POUR, MD  oxyCODONE  (ROXICODONE ) 5 MG immediate release tablet Take 1 tablet (5 mg total) by mouth every 4 (four) hours as needed for severe pain (pain score 7-10). 04/07/24   Yolande Lamar BROCKS, MD  sevelamer  carbonate (RENVELA ) 2.4 g PACK Take 2.4 g by mouth daily. 01/21/24   [provider]  tiZANidine  (ZANAFLEX ) 4 MG tablet Take 4 mg by mouth daily as needed for muscle spasms. 12/03/23   [provider]   Current Facility-Administered Medications  Medication Dose Route Frequency Provider Last Rate Last Admin    Chlorhexidine  Gluconate Cloth 2 % PADS 6 each  6 each Topical Q0600 Jake Maisie Fellows, PA-C       Current Outpatient Medications  Medication Sig Dispense Refill   acetaminophen  (TYLENOL ) 325 MG tablet Take 2 tablets (650 mg total) by mouth every 6 (six) hours.     allopurinol  (ZYLOPRIM ) 100 MG tablet Take 1 tablet (100 mg total) by mouth daily. 30 tablet 0   amLODipine  (NORVASC ) 10 MG tablet Take 20 mg by mouth at bedtime.     Blood Pressure KIT Uncontrolled blood pressure 1 kit 0   Blood Pressure Monitor MISC Use to check blood pressure 1 each 0   carvedilol  (COREG ) 25 MG tablet Take 1 tablet (25 mg total) by mouth 2 (two) times daily with a meal. 180 tablet 0   cloNIDine  (CATAPRES ) 0.1 MG tablet Take  1 tablet (0.1 mg total) by mouth daily. 30 tablet 0   furosemide  (LASIX ) 80 MG tablet Take 1 tablet (80 mg total) by mouth daily. (Patient taking differently: Take 160 mg by mouth 2 (two) times daily.)     irbesartan  (AVAPRO ) 300 MG tablet Take 1 tablet (300 mg total) by mouth daily. 90 tablet 3   lidocaine -prilocaine  (EMLA ) cream Apply 1 Application topically daily as needed (for dialysis).     medroxyPROGESTERone  (DEPO-PROVERA ) 150 MG/ML injection Inject 1 mL (150 mg total) into the muscle every 3 (three) months. 1 mL 0   nitroGLYCERIN  (NITROSTAT ) 0.4 MG SL tablet Place 1 tablet (0.4 mg total) under the tongue every 5 (five) minutes as needed for chest pain. 30 tablet 12   ondansetron  (ZOFRAN ) 4 MG tablet Take 1 tablet (4 mg total) by mouth every 6 (six) hours. 12 tablet 0   ondansetron  (ZOFRAN -ODT) 4 MG disintegrating tablet Take 1 tablet (4 mg total) by mouth every 8 (eight) hours as needed for nausea or vomiting. (Patient taking differently: Take 4 mg by mouth daily as needed for nausea or vomiting.) 20 tablet 0   oxyCODONE  (ROXICODONE ) 5 MG immediate release tablet Take 1 tablet (5 mg total) by mouth every 4 (four) hours as needed for severe pain (pain score 7-10). 15 tablet 0   sevelamer   carbonate (RENVELA ) 2.4 g PACK Take 2.4 g by mouth daily.     tiZANidine  (ZANAFLEX ) 4 MG tablet Take 4 mg by mouth daily as needed for muscle spasms.       ROS: As per HPI otherwise negative.  Physical Exam: Vitals:   04/28/24 0615 04/28/24 0630 04/28/24 0732 04/28/24 0830  BP: (!) 178/139 (!) 172/134  (!) 173/144  Pulse:  66  (!) 110  Resp:    18  Temp:      SpO2:  91% 100% 100%  Weight:      Height:         General: Appears comfortable, in no distress  Head: NCAT sclera not icteric MMM Neck: Supple. No JVD appreciated  Lungs: Clear bilaterally, normal wob  Heart: RRR, no murmur, rub, or gallop  Abdomen: soft non-tender, no masses  Lower extremities: no LE edema  Neuro: A & O X 3. Moves all extremities spontaneously. Psych:  Responds to questions appropriately with a normal affect. Dialysis Access: LUE AVF +bruit   Labs: Basic Metabolic Panel: Recent Labs  Lab 04/28/24 0505  NA 134*  K 5.6*  CL 92*  CO2 23  GLUCOSE 76  BUN 45*  CREATININE 9.96*  CALCIUM  9.7   Liver Function Tests: No results for input(s): AST, ALT, ALKPHOS, BILITOT, PROT, ALBUMIN  in the last 168 hours. No results for input(s): LIPASE, AMYLASE in the last 168 hours. No results for input(s): AMMONIA in the last 168 hours. CBC: Recent Labs  Lab 04/28/24 0505  WBC 7.3  HGB 11.4*  HCT 37.0  MCV 94.4  PLT 190   Cardiac Enzymes: No results for input(s): CKTOTAL, CKMB, CKMBINDEX, TROPONINI in the last 168 hours. CBG: No results for input(s): GLUCAP in the last 168 hours. Iron  Studies: No results for input(s): IRON , TIBC, TRANSFERRIN, FERRITIN in the last 72 hours. Studies/Results: CT ABDOMEN PELVIS WO CONTRAST Result Date: 04/28/2024 EXAM: CT ABDOMEN AND PELVIS WITHOUT CONTRAST 04/28/2024 05:58:43 AM TECHNIQUE: CT of the abdomen and pelvis was performed without the administration of intravenous contrast. Multiplanar reformatted images are provided for  review. Automated exposure control, iterative reconstruction, and/or weight-based adjustment of  the mA/kV was utilized to reduce the radiation dose to as low as reasonably achievable. COMPARISON: 04/24/2024 CLINICAL HISTORY: Abdominal/flank pain, stone suspected. Back pain. FINDINGS: LOWER CHEST: Mild atelectasis noted within the lung bases. No pleural fluid or consolidative change. LIVER: Normal size and contour. GALLBLADDER AND BILE DUCTS: No wall thickening. No cholelithiasis. No biliary ductal dilatation. SPLEEN: Normal size. No focal lesion. PANCREAS: No mass. No ductal dilatation. ADRENAL GLANDS: Normal appearance. No mass. KIDNEYS, URETERS AND BLADDER: No stones in the kidneys or ureters. No hydronephrosis. No perinephric or periureteral stranding. Urinary bladder is unremarkable. GI AND BOWEL: The appendix is visualized and appears normal. Mild retained stool within the ascending colon and rectum. There is no bowel obstruction. No bowel wall thickening. PERITONEUM AND RETROPERITONEUM: No ascites. No free air. VASCULATURE: Age advanced aortic atherosclerotic calcification. LYMPH NODES: No lymphadenopathy. REPRODUCTIVE ORGANS: Uterus and adnexal structures appear normal. BONES AND SOFT TISSUES: Unchanged bilateral L5 pars defects with minimal, grade 1 anterolisthesis of L5 on S1. No acute osseous finding. Extensive soft tissue calcifications identified in the posterior gluteal regions and lower back. IMPRESSION: 1. No acute findings in the abdomen or pelvis. 2. Cardiomegaly and age-advanced aortic atherosclerotic calcification. Electronically signed by: Waddell Calk MD 04/28/2024 06:31 AM EDT RP Workstation: [id number]    Dialysis Orders:  AF MWF 3:30 BFR 400 EDW 68.9 kg 2K/2Ca AVG Post wt  Hep 3000 Hectorol  8 q HD, Sensipar  150 q HD, Renvela  2.4g packet q meals   Assessment/Plan: ESRD.  HD MWF.  In the ED this am with SOB and back pain. K 5.6. Plan for dialysis today, challenge dry weight.   Access. LUE AVF. No issues reported  Hypertension. Uncontrolled. Question med compliance. HD should help.  Volume. Challenge EDW  today UF goal 2-3L  Anemia. Hgb stable  MBD.  Hyperphosphatemia. Resume binders if admitted    Maisie Ronnald Acosta PA-C [facility] 04/28/2024, 9:09 AM

## 2024-04-28 NOTE — ED Triage Notes (Signed)
 Pt c/o left sided back/flank pain that started 3 days ago that has worsened since it started. Pt states she usually has dialysis MWF, last dialysis Wed w/full treatment. Pt states she usually urinates but it has been getting less often. Pt states she has shortness of breath that started yesterday and worsens when she lays down.

## 2024-04-28 NOTE — ED Notes (Signed)
 Pt requesting to speak to Consulting civil engineer.  Pt is upset she was moved to the hallway to wait for dialysis.

## 2024-04-28 NOTE — ED Notes (Signed)
 PA to speak with patient regarding pain medication.

## 2024-04-28 NOTE — ED Provider Notes (Cosign Needed)
 [place] [facility]GENC[facility] DEPARTMENT AT [facility] Provider Note   CSN: [id number] Arrival date & time: 04/28/24  9562     Patient presents with: Shortness of Breath and Back Pain   Linda [name] is a 28 y.o. female. Patient with medical history significant for SLE, ESRD on Monday Wednesday Friday hemodialysis presents to the emergency room with a chief complaint of left-sided flank pain.  She states this been ongoing for 2 to 3 days.  She states her urine output is decreased from its usual.  She also endorses shortness of breath with 2 days of cough.  She denies fever, nausea, vomiting, chest pain, chills, body aches.  She completed her normal dialysis session on Wednesday of this week.    Shortness of Breath Back Pain      Prior to Admission medications   Medication Sig Start Date End Date Taking? Authorizing Provider  acetaminophen  (TYLENOL ) 325 MG tablet Take 2 tablets (650 mg total) by mouth every 6 (six) hours. 03/11/24   Elgergawy, Brayton RAMAN, MD  allopurinol  (ZYLOPRIM ) 100 MG tablet Take 1 tablet (100 mg total) by mouth daily. 03/11/24   Elgergawy, Brayton RAMAN, MD  amLODipine  (NORVASC ) 10 MG tablet Take 20 mg by mouth at bedtime. 08/05/21   [provider]  Blood Pressure KIT Uncontrolled blood pressure 03/11/24   Elgergawy, Brayton RAMAN, MD  Blood Pressure Monitor MISC Use to check blood pressure 03/11/24   Elgergawy, Brayton RAMAN, MD  carvedilol  (COREG ) 25 MG tablet Take 1 tablet (25 mg total) by mouth 2 (two) times daily with a meal. 03/30/24   [name], NP  cloNIDine  (CATAPRES ) 0.1 MG tablet Take 1 tablet (0.1 mg total) by mouth daily. 03/11/24   Elgergawy, Brayton RAMAN, MD  furosemide  (LASIX ) 80 MG tablet Take 1 tablet (80 mg total) by mouth daily. Patient taking differently: Take 160 mg by mouth 2 (two) times daily. 12/23/23   Fairy Frames, MD  irbesartan  (AVAPRO ) 300 MG tablet Take 1 tablet (300 mg total) by mouth daily. 02/03/24   Walker, Caitlin S, NP   lidocaine -prilocaine  (EMLA ) cream Apply 1 Application topically daily as needed (for dialysis). 01/14/24   [provider]  medroxyPROGESTERone  (DEPO-PROVERA ) 150 MG/ML injection Inject 1 mL (150 mg total) into the muscle every 3 (three) months. 10/29/22   Rudy Carlin LABOR, MD  nitroGLYCERIN  (NITROSTAT ) 0.4 MG SL tablet Place 1 tablet (0.4 mg total) under the tongue every 5 (five) minutes as needed for chest pain. 10/28/23   Rai, Nydia POUR, MD  ondansetron  (ZOFRAN ) 4 MG tablet Take 1 tablet (4 mg total) by mouth every 6 (six) hours. 04/06/24   Robinson, John K, PA-C  ondansetron  (ZOFRAN -ODT) 4 MG disintegrating tablet Take 1 tablet (4 mg total) by mouth every 8 (eight) hours as needed for nausea or vomiting. Patient taking differently: Take 4 mg by mouth daily as needed for nausea or vomiting. 10/28/23   Rai, Nydia POUR, MD  oxyCODONE  (ROXICODONE ) 5 MG immediate release tablet Take 1 tablet (5 mg total) by mouth every 4 (four) hours as needed for severe pain (pain score 7-10). 04/07/24   Yolande Lamar BROCKS, MD  sevelamer  carbonate (RENVELA ) 2.4 g PACK Take 2.4 g by mouth daily. 01/21/24   [provider]  tiZANidine  (ZANAFLEX ) 4 MG tablet Take 4 mg by mouth daily as needed for muscle spasms. 12/03/23   [provider]    Allergies: Iodine , Lisinopril , Gabapentin, Ativan  [lorazepam ], Hydroxychloroquine , and Vicodin [hydrocodone -acetaminophen ]  Review of Systems  Respiratory:  Positive for shortness of breath.   Musculoskeletal:  Positive for back pain.    Updated Vital Signs BP (!) 172/134   Pulse 66   Temp (!) 97.5 F (36.4 C)   Resp 14   Ht 5' 6 (1.676 m)   Wt 68 kg Comment: dry weight per pt  SpO2 91%   BMI 24.20 kg/m   Physical Exam Vitals and nursing note reviewed.  Constitutional:      General: She is not in acute distress.    Appearance: She is well-developed.  HENT:     Head: Normocephalic and atraumatic.  Eyes:     Conjunctiva/sclera: Conjunctivae  normal.  Cardiovascular:     Rate and Rhythm: Normal rate and regular rhythm.  Pulmonary:     Effort: Pulmonary effort is normal. No respiratory distress.     Breath sounds: Normal breath sounds. No wheezing, rhonchi or rales.  Abdominal:     Palpations: Abdomen is soft.     Tenderness: There is no abdominal tenderness. There is left CVA tenderness.  Musculoskeletal:        General: No swelling.     Cervical back: Neck supple.     Right lower leg: No edema.     Left lower leg: No edema.  Skin:    General: Skin is warm and dry.     Capillary Refill: Capillary refill takes less than 2 seconds.  Neurological:     Mental Status: She is alert.  Psychiatric:        Mood and Affect: Mood normal.     (all labs ordered are listed, but only abnormal results are displayed) Labs Reviewed  BASIC METABOLIC PANEL WITH GFR - Abnormal; Notable for the following components:      Result Value   Sodium 134 (*)    Potassium 5.6 (*)    Chloride 92 (*)    BUN 45 (*)    Creatinine, Ser 9.96 (*)    GFR, Estimated 5 (*)    Anion gap 19 (*)    All other components within normal limits  CBC - Abnormal; Notable for the following components:   Hemoglobin 11.4 (*)    RDW 19.8 (*)    All other components within normal limits  HCG, SERUM, QUALITATIVE  URINALYSIS, ROUTINE W REFLEX MICROSCOPIC    EKG: EKG Interpretation Date/Time:  Friday April 28 2024 04:54:19 EDT Ventricular Rate:  93 PR Interval:  170 QRS Duration:  90 QT Interval:  398 QTC Calculation: 494 R Axis:   0  Text Interpretation: Normal sinus rhythm Biatrial enlargement RSR' or QR pattern in V1 suggests right ventricular conduction delay Prolonged QT Abnormal ECG When compared with ECG of 06-Apr-2024 03:48, PREVIOUS ECG IS PRESENT Confirmed by Bari Pfeiffer ([id number]) on 04/28/2024 5:11:43 AM  Radiology: CT ABDOMEN PELVIS WO CONTRAST Result Date: 04/28/2024 EXAM: CT ABDOMEN AND PELVIS WITHOUT CONTRAST 04/28/2024 05:58:43 AM  TECHNIQUE: CT of the abdomen and pelvis was performed without the administration of intravenous contrast. Multiplanar reformatted images are provided for review. Automated exposure control, iterative reconstruction, and/or weight-based adjustment of the mA/kV was utilized to reduce the radiation dose to as low as reasonably achievable. COMPARISON: 04/24/2024 CLINICAL HISTORY: Abdominal/flank pain, stone suspected. Back pain. FINDINGS: LOWER CHEST: Mild atelectasis noted within the lung bases. No pleural fluid or consolidative change. LIVER: Normal size and contour. GALLBLADDER AND BILE DUCTS: No wall thickening. No cholelithiasis. No biliary ductal dilatation. SPLEEN: Normal size. No focal lesion.  PANCREAS: No mass. No ductal dilatation. ADRENAL GLANDS: Normal appearance. No mass. KIDNEYS, URETERS AND BLADDER: No stones in the kidneys or ureters. No hydronephrosis. No perinephric or periureteral stranding. Urinary bladder is unremarkable. GI AND BOWEL: The appendix is visualized and appears normal. Mild retained stool within the ascending colon and rectum. There is no bowel obstruction. No bowel wall thickening. PERITONEUM AND RETROPERITONEUM: No ascites. No free air. VASCULATURE: Age advanced aortic atherosclerotic calcification. LYMPH NODES: No lymphadenopathy. REPRODUCTIVE ORGANS: Uterus and adnexal structures appear normal. BONES AND SOFT TISSUES: Unchanged bilateral L5 pars defects with minimal, grade 1 anterolisthesis of L5 on S1. No acute osseous finding. Extensive soft tissue calcifications identified in the posterior gluteal regions and lower back. IMPRESSION: 1. No acute findings in the abdomen or pelvis. 2. Cardiomegaly and age-advanced aortic atherosclerotic calcification. Electronically signed by: Waddell Calk MD 04/28/2024 06:31 AM EDT RP Workstation: [id number]     Procedures   Medications Ordered in the ED  fentaNYL  (SUBLIMAZE ) injection 50 mcg (50 mcg Intravenous Given 04/28/24 9372)                                     Medical Decision Making Amount and/or Complexity of Data Reviewed Labs: ordered. Radiology: ordered.   This patient presents to the ED for concern of flank pain, this involves an extensive number of treatment options, and is a complaint that carries with it a high risk of complications and morbidity.  The differential diagnosis includes nephrolithiasis, musculoskeletal pain, pyelonephritis, others.  Patient also complains of shortness of breath.  Differential includes viral illness, fluid overload, pneumonia, others   Co morbidities / Chronic conditions that complicate the patient evaluation  ESRD   Additional history obtained:  Additional history obtained from EMR External records from outside source obtained and reviewed including primary care notes, patient referred to pain management   Lab Tests:  I Ordered, and personally interpreted labs.  The pertinent results include: Potassium 5.6, creatinine 9.96, negative pregnancy test   Imaging Studies ordered:  I ordered imaging studies including CT abdomen pelvis without contrast I independently visualized and interpreted imaging which showed  1. No acute findings in the abdomen or pelvis.  2. Cardiomegaly and age-advanced aortic atherosclerotic calcification.   I agree with the radiologist interpretation   Cardiac Monitoring: / EKG:  The patient was maintained on a cardiac monitor.  I personally viewed and interpreted the cardiac monitored which showed an underlying rhythm of: normal sinus rhythm   Problem List / ED Course / Critical interventions / Medication management   I ordered medication including fentanyl    Reevaluation of the patient after these medicines showed that the patient improved   Test / Admission - Considered:  CT scan grossly unremarkable. Patient care signed out to Linda Chancy, PA-C at shift handoff. Patient will unfortunately miss dialysis today and will need a  session before discharge. Plan on reassessment in the ED after dialysis.       Final diagnoses:  Acute left-sided low back pain without sciatica  Acute hemodialysis encounter [facility])    ED Discharge Orders     None          Logan Ubaldo KATHEE DEVONNA 04/28/24 [phone]

## 2024-04-28 NOTE — Progress Notes (Signed)
 Pt receives out-pt HD at [facility] [place] on MWF 7:10 am chair time. Will assist as needed.   Randine Mungo Dialysis Navigator [phone]

## 2024-04-28 NOTE — ED Notes (Signed)
 Okay to wait for dialysis in hall bed per PA Beverley

## 2024-04-28 NOTE — ED Provider Notes (Cosign Needed Addendum)
 28 yo female ESRD, scheduled for dialysis today at 7am here for back pain, will need dialysis this AM.  Flank pain on left side, CT without acute findings. K 5.6, SHOB lungs clear.  Physical Exam  BP (!) 162/135 (BP Location: Right Arm)   Pulse 83   Temp 97.6 F (36.4 C) (Oral)   Resp 20   Ht 5' 6 (1.676 m)   Wt 68 kg Comment: dry weight per pt  SpO2 100%   BMI 24.20 kg/m   Physical Exam  Procedures  Procedures  ED Course / MDM    Medical Decision Making Amount and/or Complexity of Data Reviewed Labs: ordered. Radiology: ordered.  Risk Prescription drug management.   Case discussed with Dr. Melia with nephrology who recommends dialysis.  Patient complained of left side back pain, offered lidoderm  and percocet. Patient refuses, states percocet is not strong enough.   Declines oxycodone  5mg . States she takes 15mg  oxycodone , requesting same.  Database reviewed, has not received 15mg  since 08/2023. Plan is to offer 10mg . Pt should be ready for dialysis soon.        Beverley Leita LABOR, PA-C 04/28/24 9180    Beverley Leita LABOR, PA-C 04/28/24 1322

## 2024-04-29 LAB — HEPATITIS B SURFACE ANTIBODY, QUANTITATIVE: Hep B S AB Quant (Post): <3.5 m[IU]/mL — ABNORMAL LOW

## 2024-04-30 DIAGNOSIS — N186 End stage renal disease: Secondary | ICD-10-CM | POA: Diagnosis not present

## 2024-04-30 DIAGNOSIS — Z992 Dependence on renal dialysis: Secondary | ICD-10-CM | POA: Diagnosis not present

## 2024-04-30 DIAGNOSIS — M3210 Systemic lupus erythematosus, organ or system involvement unspecified: Secondary | ICD-10-CM | POA: Diagnosis not present

## 2024-05-01 DIAGNOSIS — D689 Coagulation defect, unspecified: Secondary | ICD-10-CM | POA: Diagnosis not present

## 2024-05-01 DIAGNOSIS — D631 Anemia in chronic kidney disease: Secondary | ICD-10-CM | POA: Diagnosis not present

## 2024-05-01 DIAGNOSIS — I132 Hypertensive heart and chronic kidney disease with heart failure and with stage 5 chronic kidney disease, or end stage renal disease: Secondary | ICD-10-CM | POA: Diagnosis not present

## 2024-05-01 DIAGNOSIS — Z992 Dependence on renal dialysis: Secondary | ICD-10-CM | POA: Diagnosis not present

## 2024-05-01 DIAGNOSIS — N2581 Secondary hyperparathyroidism of renal origin: Secondary | ICD-10-CM | POA: Diagnosis not present

## 2024-05-01 DIAGNOSIS — L299 Pruritus, unspecified: Secondary | ICD-10-CM | POA: Diagnosis not present

## 2024-05-01 DIAGNOSIS — D509 Iron deficiency anemia, unspecified: Secondary | ICD-10-CM | POA: Diagnosis not present

## 2024-05-05 DIAGNOSIS — N2581 Secondary hyperparathyroidism of renal origin: Secondary | ICD-10-CM | POA: Diagnosis not present

## 2024-05-08 DIAGNOSIS — D689 Coagulation defect, unspecified: Secondary | ICD-10-CM | POA: Diagnosis not present

## 2024-05-08 DIAGNOSIS — Z992 Dependence on renal dialysis: Secondary | ICD-10-CM | POA: Diagnosis not present

## 2024-05-08 DIAGNOSIS — L299 Pruritus, unspecified: Secondary | ICD-10-CM | POA: Diagnosis not present

## 2024-05-08 DIAGNOSIS — N186 End stage renal disease: Secondary | ICD-10-CM | POA: Diagnosis not present

## 2024-05-08 DIAGNOSIS — I132 Hypertensive heart and chronic kidney disease with heart failure and with stage 5 chronic kidney disease, or end stage renal disease: Secondary | ICD-10-CM | POA: Diagnosis not present

## 2024-05-08 DIAGNOSIS — N2581 Secondary hyperparathyroidism of renal origin: Secondary | ICD-10-CM | POA: Diagnosis not present

## 2024-05-08 DIAGNOSIS — D631 Anemia in chronic kidney disease: Secondary | ICD-10-CM | POA: Diagnosis not present

## 2024-05-08 DIAGNOSIS — D509 Iron deficiency anemia, unspecified: Secondary | ICD-10-CM | POA: Diagnosis not present

## 2024-05-10 DIAGNOSIS — N2581 Secondary hyperparathyroidism of renal origin: Secondary | ICD-10-CM | POA: Diagnosis not present

## 2024-05-10 DIAGNOSIS — L299 Pruritus, unspecified: Secondary | ICD-10-CM | POA: Diagnosis not present

## 2024-05-10 DIAGNOSIS — N186 End stage renal disease: Secondary | ICD-10-CM | POA: Diagnosis not present

## 2024-05-10 DIAGNOSIS — Z992 Dependence on renal dialysis: Secondary | ICD-10-CM | POA: Diagnosis not present

## 2024-05-10 DIAGNOSIS — D509 Iron deficiency anemia, unspecified: Secondary | ICD-10-CM | POA: Diagnosis not present

## 2024-05-10 DIAGNOSIS — I132 Hypertensive heart and chronic kidney disease with heart failure and with stage 5 chronic kidney disease, or end stage renal disease: Secondary | ICD-10-CM | POA: Diagnosis not present

## 2024-05-10 DIAGNOSIS — D631 Anemia in chronic kidney disease: Secondary | ICD-10-CM | POA: Diagnosis not present

## 2024-05-12 DIAGNOSIS — L299 Pruritus, unspecified: Secondary | ICD-10-CM | POA: Diagnosis not present

## 2024-05-14 ENCOUNTER — Encounter (HOSPITAL_COMMUNITY): Payer: Self-pay | Admitting: Emergency Medicine

## 2024-05-14 ENCOUNTER — Other Ambulatory Visit: Payer: Self-pay

## 2024-05-14 ENCOUNTER — Emergency Department (HOSPITAL_COMMUNITY)

## 2024-05-14 ENCOUNTER — Emergency Department (HOSPITAL_COMMUNITY)
Admission: EM | Admit: 2024-05-14 | Discharge: 2024-05-14 | Disposition: A | Discharge status: Home / Self Care | Attending: Emergency Medicine | Admitting: Emergency Medicine

## 2024-05-14 DIAGNOSIS — N186 End stage renal disease: Secondary | ICD-10-CM | POA: Diagnosis not present

## 2024-05-14 DIAGNOSIS — I120 Hypertensive chronic kidney disease with stage 5 chronic kidney disease or end stage renal disease: Secondary | ICD-10-CM | POA: Diagnosis not present

## 2024-05-14 DIAGNOSIS — J811 Chronic pulmonary edema: Secondary | ICD-10-CM | POA: Diagnosis not present

## 2024-05-14 DIAGNOSIS — Z79899 Other long term (current) drug therapy: Secondary | ICD-10-CM | POA: Insufficient documentation

## 2024-05-14 DIAGNOSIS — M549 Dorsalgia, unspecified: Secondary | ICD-10-CM | POA: Diagnosis not present

## 2024-05-14 DIAGNOSIS — R059 Cough, unspecified: Secondary | ICD-10-CM | POA: Diagnosis not present

## 2024-05-14 DIAGNOSIS — Z743 Need for continuous supervision: Secondary | ICD-10-CM | POA: Diagnosis not present

## 2024-05-14 DIAGNOSIS — R0602 Shortness of breath: Secondary | ICD-10-CM | POA: Diagnosis not present

## 2024-05-14 DIAGNOSIS — R0609 Other forms of dyspnea: Secondary | ICD-10-CM

## 2024-05-14 DIAGNOSIS — Z992 Dependence on renal dialysis: Secondary | ICD-10-CM | POA: Diagnosis not present

## 2024-05-14 LAB — CBC WITH DIFFERENTIAL/PLATELET
Abs Immature Granulocytes: 0.03 K/uL (ref 0.00–0.07)
Basophils Absolute: 0 K/uL (ref 0.0–0.1)
Basophils Relative: 0 %
Eosinophils Absolute: 0.2 K/uL (ref 0.0–0.5)
Eosinophils Relative: 3 %
HCT: 37 % (ref 36.0–46.0)
Hemoglobin: 11.3 g/dL — ABNORMAL LOW (ref 12.0–15.0)
Immature Granulocytes: 0 %
Lymphocytes Relative: 47 %
Lymphs Abs: 3.3 K/uL (ref 0.7–4.0)
MCH: 29.9 pg (ref 26.0–34.0)
MCHC: 30.5 g/dL (ref 30.0–36.0)
MCV: 97.9 fL (ref 80.0–100.0)
Monocytes Absolute: 0.6 K/uL (ref 0.1–1.0)
Monocytes Relative: 9 %
Neutro Abs: 2.9 K/uL (ref 1.7–7.7)
Neutrophils Relative %: 41 %
Platelets: 128 K/uL — ABNORMAL LOW (ref 150–400)
RBC: 3.78 MIL/uL — ABNORMAL LOW (ref 3.87–5.11)
RDW: 20.7 % — ABNORMAL HIGH (ref 11.5–15.5)
WBC: 7.2 K/uL (ref 4.0–10.5)
nRBC: 0.3 % — ABNORMAL HIGH (ref 0.0–0.2)

## 2024-05-14 LAB — COMPREHENSIVE METABOLIC PANEL WITH GFR
ALT: 13 U/L (ref 0–44)
AST: 24 U/L (ref 15–41)
Albumin: 3.2 g/dL — ABNORMAL LOW (ref 3.5–5.0)
Alkaline Phosphatase: 69 U/L (ref 38–126)
Anion gap: 16 — ABNORMAL HIGH (ref 5–15)
BUN: 35 mg/dL — ABNORMAL HIGH (ref 6–20)
CO2: 21 mmol/L — ABNORMAL LOW (ref 22–32)
Calcium: 9.1 mg/dL (ref 8.9–10.3)
Chloride: 92 mmol/L — ABNORMAL LOW (ref 98–111)
Creatinine, Ser: 7.46 mg/dL — ABNORMAL HIGH (ref 0.44–1.00)
GFR, Estimated: 7 mL/min — ABNORMAL LOW (ref >60)
Glucose, Bld: 85 mg/dL (ref 70–99)
Potassium: 5.2 mmol/L — ABNORMAL HIGH (ref 3.5–5.1)
Sodium: 129 mmol/L — ABNORMAL LOW (ref 135–145)
Total Bilirubin: 1 mg/dL (ref 0.0–1.2)
Total Protein: 7.6 g/dL (ref 6.5–8.1)

## 2024-05-14 LAB — BRAIN NATRIURETIC PEPTIDE: B Natriuretic Peptide: >4500 pg/mL — ABNORMAL HIGH (ref 0.0–100.0)

## 2024-05-14 LAB — HCG, SERUM, QUALITATIVE: Preg, Serum: NEGATIVE

## 2024-05-14 NOTE — ED Triage Notes (Addendum)
 Pt in with reported sob and cough that began last night. Dialysis pt, last session Friday. Also reporting some L back pain. Adds that she vomited last night prior to EMS arrival.   VS w/EMS: 142/74 84HR 95%RA

## 2024-05-14 NOTE — Discharge Instructions (Signed)
 You were seen today for shortness of breath.  Your oxygen levels at this time are okay.  This is likely related to volume and your kidney disease.  It is very important that you go to your dialysis as scheduled tomorrow.  If in the interim you have increasing or worsening shortness of breath or any change in symptoms, you should be reevaluated.

## 2024-05-14 NOTE — ED Notes (Signed)
 EDP at [facility] updating pt

## 2024-05-14 NOTE — ED Notes (Signed)
 Pt ambulated in hallway. Pt had steady gait, but complained of shortness of breath. O2 sats 97% at the lowest.

## 2024-05-14 NOTE — ED Provider Notes (Signed)
 [place] EMERGENCY DEPARTMENT AT [facility] Provider Note   CSN: [id number] Arrival date & time: 05/14/24  0411     Patient presents with: Shortness of Breath and Cough   Linda [name] is a 28 y.o. female.   HPI     This is a 28 year old female with history of end-stage renal disease on dialysis Monday, Wednesday, Friday who presents with shortness of breath and cough.  Patient reports she had a full dialysis session on Friday.  However she began to have increasing shortness of breath and cough after the fact.  No chest pain.  Has had some vomiting.  No known sick contacts.  Denies fevers.  She states that she feels like she is not having enough pulled during her dialysis sessions.  Prior to Admission medications   Medication Sig Start Date End Date Taking? Authorizing Provider  acetaminophen  (TYLENOL ) 325 MG tablet Take 2 tablets (650 mg total) by mouth every 6 (six) hours. 03/11/24   Elgergawy, Brayton RAMAN, MD  allopurinol  (ZYLOPRIM ) 100 MG tablet Take 1 tablet (100 mg total) by mouth daily. 03/11/24   Elgergawy, Brayton RAMAN, MD  amLODipine  (NORVASC ) 10 MG tablet Take 20 mg by mouth at bedtime. 08/05/21   [provider]  Blood Pressure KIT Uncontrolled blood pressure 03/11/24   Elgergawy, Brayton RAMAN, MD  Blood Pressure Monitor MISC Use to check blood pressure 03/11/24   Elgergawy, Brayton RAMAN, MD  carvedilol  (COREG ) 25 MG tablet Take 1 tablet (25 mg total) by mouth 2 (two) times daily with a meal. 03/30/24   [name], NP  cloNIDine  (CATAPRES ) 0.1 MG tablet Take 1 tablet (0.1 mg total) by mouth daily. 03/11/24   Elgergawy, Brayton RAMAN, MD  furosemide  (LASIX ) 80 MG tablet Take 1 tablet (80 mg total) by mouth daily. Patient taking differently: Take 160 mg by mouth 2 (two) times daily. 12/23/23   Fairy Frames, MD  irbesartan  (AVAPRO ) 300 MG tablet Take 1 tablet (300 mg total) by mouth daily. 02/03/24   Walker, Caitlin S, NP  lidocaine -prilocaine  (EMLA ) cream Apply  1 Application topically daily as needed (for dialysis). 01/14/24   [provider]  medroxyPROGESTERone  (DEPO-PROVERA ) 150 MG/ML injection Inject 1 mL (150 mg total) into the muscle every 3 (three) months. 10/29/22   Rudy Carlin LABOR, MD  nitroGLYCERIN  (NITROSTAT ) 0.4 MG SL tablet Place 1 tablet (0.4 mg total) under the tongue every 5 (five) minutes as needed for chest pain. 10/28/23   Rai, Nydia POUR, MD  ondansetron  (ZOFRAN ) 4 MG tablet Take 1 tablet (4 mg total) by mouth every 6 (six) hours. 04/06/24   Robinson, John K, PA-C  ondansetron  (ZOFRAN -ODT) 4 MG disintegrating tablet Take 1 tablet (4 mg total) by mouth every 8 (eight) hours as needed for nausea or vomiting. Patient taking differently: Take 4 mg by mouth daily as needed for nausea or vomiting. 10/28/23   Rai, Nydia POUR, MD  oxyCODONE  (ROXICODONE ) 5 MG immediate release tablet Take 1 tablet (5 mg total) by mouth every 4 (four) hours as needed for severe pain (pain score 7-10). 04/07/24   Yolande Lamar BROCKS, MD  sevelamer  carbonate (RENVELA ) 2.4 g PACK Take 2.4 g by mouth daily. 01/21/24   [provider]  tiZANidine  (ZANAFLEX ) 4 MG tablet Take 4 mg by mouth daily as needed for muscle spasms. 12/03/23   [provider]    Allergies: Iodine , Lisinopril , Gabapentin, Ativan  [lorazepam ], Hydroxychloroquine , and Vicodin [hydrocodone -acetaminophen ]    Review of Systems  Constitutional:  Negative for fever.  Respiratory:  Positive for cough and shortness of breath. Negative for wheezing.   Cardiovascular:  Negative for chest pain.  Gastrointestinal:  Negative for abdominal pain.  All other systems reviewed and are negative.   Updated Vital Signs BP (!) 129/106   Pulse 78   Temp 98.4 F (36.9 C) (Oral)   Resp (!) 21   Wt 68 kg   SpO2 100%   BMI 24.20 kg/m   Physical Exam Vitals and nursing note reviewed.  Constitutional:      Appearance: She is well-developed.     Comments: Chronically ill-appearing but  nontoxic  HENT:     Head: Normocephalic and atraumatic.  Eyes:     Pupils: Pupils are equal, round, and reactive to light.  Cardiovascular:     Rate and Rhythm: Normal rate and regular rhythm.     Heart sounds: Normal heart sounds.  Pulmonary:     Effort: Pulmonary effort is normal. No respiratory distress.     Breath sounds: No wheezing.     Comments: No tachypnea, fair air movement, no respiratory distress Abdominal:     General: Bowel sounds are normal.     Palpations: Abdomen is soft.  Musculoskeletal:     Cervical back: Neck supple.  Skin:    General: Skin is warm and dry.  Neurological:     Mental Status: She is alert and oriented to person, place, and time.  Psychiatric:        Mood and Affect: Mood normal.     (all labs ordered are listed, but only abnormal results are displayed) Labs Reviewed  CBC WITH DIFFERENTIAL/PLATELET - Abnormal; Notable for the following components:      Result Value   RBC 3.78 (*)    Hemoglobin 11.3 (*)    RDW 20.7 (*)    Platelets 128 (*)    nRBC 0.3 (*)    All other components within normal limits  BRAIN NATRIURETIC PEPTIDE - Abnormal; Notable for the following components:   B Natriuretic Peptide >4,500.0 (*)    All other components within normal limits  COMPREHENSIVE METABOLIC PANEL WITH GFR - Abnormal; Notable for the following components:   Sodium 129 (*)    Potassium 5.2 (*)    Chloride 92 (*)    CO2 21 (*)    BUN 35 (*)    Creatinine, Ser 7.46 (*)    Albumin  3.2 (*)    GFR, Estimated 7 (*)    Anion gap 16 (*)    All other components within normal limits  RESP PANEL BY RT-PCR (RSV, FLU A&B, COVID)  RVPGX2  HCG, SERUM, QUALITATIVE    EKG: EKG Interpretation Date/Time:  Sunday May 14 2024 06:10:34 EDT Ventricular Rate:  77 PR Interval:  179 QRS Duration:  95 QT Interval:  433 QTC Calculation: 491 R Axis:   54  Text Interpretation: Sinus rhythm Biatrial enlargement Borderline prolonged QT interval Confirmed by  Bari Pfeiffer ([id number]) on 05/14/2024 6:12:10 AM  Radiology: [facility] Chest 2 View Result Date: 05/14/2024 EXAM: 2 VIEW(S) XRAY OF THE CHEST 05/14/2024 05:25:32 AM COMPARISON: 04/07/2024 CLINICAL HISTORY: Pt in with reported sob and cough that began last night. Dialysis pt, last session Friday. Also reporting some L back pain. FINDINGS: LUNGS AND PLEURA: Pulmonary edema, mild. No focal pulmonary opacity. No pleural effusion. No pneumothorax. HEART AND MEDIASTINUM: Cardiomegaly. No acute abnormality of the cardiac and mediastinal silhouettes. BONES AND SOFT TISSUES: No acute osseous abnormality. Vascular stent in left  upper arm. IMPRESSION: 1. Cardiomegaly. 2. Mild pulmonary edema. Electronically signed by: Waddell Calk MD 05/14/2024 05:59 AM EDT RP Workstation: [id number]     Procedures   Medications Ordered in the ED - No data to display                                  Medical Decision Making Amount and/or Complexity of Data Reviewed Labs: ordered. Radiology: ordered.   This patient presents to the ED for concern of shortness of breath, this involves an extensive number of treatment options, and is a complaint that carries with it a high risk of complications and morbidity.  I considered the following differential and admission for this acute, potentially life threatening condition.  The differential diagnosis includes overload, need for dialysis, pneumonia, pneumothorax, ACS  MDM:    This is a 28 year old female with a history of end-stage renal disease who presents with shortness of breath.  Not hypoxic but feels better with oxygen on.  She does have some lower extremity edema but is in no respiratory distress.  Labs obtained and reviewed.  Mostly at her baseline.  BNP greater than 4500 which is unchanged from prior evaluations.  Mild hyperkalemia at 5.2 but she is due for dialysis tomorrow.  EKG shows no evidence of QRS widening.  Chest x-ray shows some mild pulmonary edema.  Patient was  taken off oxygen and was able to ambulate maintain her pulse ox at 97%.  Discussed with the patient that I suspected her subjective shortness of breath was likely related to anticipating need for dialysis.  She will need her full dialysis sessions tomorrow and needs to talk to her nephrologist about potentially taking off more fluid.  If anything changes between now and then she is to return.  She stated understanding.  (Labs, imaging, consults)  Labs: I Ordered, and personally interpreted labs.  The pertinent results include: CBC, BMP, BNP  Imaging Studies ordered: I ordered imaging studies including chest x-ray I independently visualized and interpreted imaging. I agree with the radiologist interpretation  Additional history obtained from chart review.  External records from outside source obtained and reviewed including prior evaluations  Cardiac Monitoring: The patient was maintained on a cardiac monitor.  If on the cardiac monitor, I personally viewed and interpreted the cardiac monitored which showed an underlying rhythm of: Sinus  Reevaluation: After the interventions noted above, I reevaluated the patient and found that they have :stayed the same  Social Determinants of Health:  lives independently  Disposition: Discharge  Co morbidities that complicate the patient evaluation  Past Medical History:  Diagnosis Date   Abnormal ECG    a. In setting of LVH w/ repolarization abnormalities; b. 03/2019 MV: No ischemia/infarct. EF 55-65%.   Angioedema    ESRD on hemodialysis (HCC)    GERD (gastroesophageal reflux disease)    Hypertension    Lupus    LVH (left ventricular hypertrophy)    a. 11/2017 Echo: EF 50-55%. triv AI. Mild MR. Mod L and mild R atrial enlargement. Nl RV size/fxn. Small pericardial effusion w/o tampondae; b. 03/2018 Echo: EF 55-60%, restrictive filling pattern. Nl RV size/fxn. Sev LAE. Mild MR, mild to mod TR/PR. Mod PAH. Triv effusion.   Migraines    Mixed  connective tissue disease (HCC)    Previous Dialysis patient [facility])    a. Off HD since late 2020.   Septic prepatellar bursitis of right knee  08/19/2017     Medicines No orders of the defined types were placed in this encounter.   I have reviewed the patients home medicines and have made adjustments as needed  Problem List / ED Course: Problem List Items Addressed This Visit   None Visit Diagnoses       DOE (dyspnea on exertion)    -  Primary                Final diagnoses:  DOE (dyspnea on exertion)    ED Discharge Orders     None          Ailish Prospero, Charmaine FALCON, MD 05/14/24 [phone]

## 2024-05-15 DIAGNOSIS — N2581 Secondary hyperparathyroidism of renal origin: Secondary | ICD-10-CM | POA: Diagnosis not present

## 2024-05-16 ENCOUNTER — Other Ambulatory Visit: Payer: Self-pay

## 2024-05-16 ENCOUNTER — Telehealth: Payer: Self-pay | Admitting: Family Medicine

## 2024-05-16 DIAGNOSIS — Z992 Dependence on renal dialysis: Secondary | ICD-10-CM | POA: Diagnosis not present

## 2024-05-16 DIAGNOSIS — E8770 Fluid overload, unspecified: Secondary | ICD-10-CM | POA: Diagnosis not present

## 2024-05-16 DIAGNOSIS — N2581 Secondary hyperparathyroidism of renal origin: Secondary | ICD-10-CM | POA: Diagnosis not present

## 2024-05-16 DIAGNOSIS — N186 End stage renal disease: Secondary | ICD-10-CM | POA: Diagnosis not present

## 2024-05-16 NOTE — Telephone Encounter (Signed)
 Copied from CRM [id number]. Topic: Clinical - Medication Refill >> May 16, 2024  2:21 PM Tanazia G wrote: Medication:  tiZANidine  (ZANAFLEX ) 4 MG tablet irbesartan  (AVAPRO ) 300 MG tablet  amLODipine  (NORVASC ) 10 MG tablet   Has the patient contacted their pharmacy? Yes (Agent: If no, request that the patient contact the pharmacy for the refill. If patient does not wish to contact the pharmacy document the reason why and proceed with request.) (Agent: If yes, when and what did the pharmacy advise?)  This is the patient's preferred pharmacy:  [id number] [place], [place] - [address] [place] [address] Phone: [phone] Fax: [phone]  Is this the correct pharmacy for this prescription? Yes If no, delete pharmacy and type the correct one.   Has the prescription been filled recently? Yes  Is the patient out of the medication? yes  Has the patient been seen for an appointment in the last year OR does the patient have an upcoming appointment? Yes  Can we respond through MyChart? Yes  Agent: Please be advised that Rx refills may take up to 3 business days. We ask that you follow-up with your pharmacy.

## 2024-05-16 NOTE — Telephone Encounter (Signed)
 Copied from CRM [id number]. Topic: Clinical - Medication Refill >> May 16, 2024  2:21 PM Mercedes MATSU wrote: Medication:  tiZANidine  (ZANAFLEX ) 4 MG tablet  Has the patient contacted their pharmacy? Yes (Agent: If no, request that the patient contact the pharmacy for the refill. If patient does not wish to contact the pharmacy document the reason why and proceed with request.) (Agent: If yes, when and what did the pharmacy advise?)  This is the patient's preferred pharmacy:  [id number] [place], [place] - [address] [place]  [address] Phone: [phone] Fax: [phone]  Is this the correct pharmacy for this prescription? Yes If no, delete pharmacy and type the correct one.   Has the prescription been filled recently? Yes  Is the patient out of the medication? yes  Has the patient been seen for an appointment in the last year OR does the patient have an upcoming appointment? Yes  Can we respond through MyChart? Yes  Agent: Please be advised that Rx refills may take up to 3 business days. We ask that you follow-up with your pharmacy.

## 2024-05-17 ENCOUNTER — Other Ambulatory Visit: Payer: Self-pay

## 2024-05-17 ENCOUNTER — Encounter: Payer: Self-pay | Admitting: Family Medicine

## 2024-05-17 DIAGNOSIS — N2581 Secondary hyperparathyroidism of renal origin: Secondary | ICD-10-CM | POA: Diagnosis not present

## 2024-05-17 MED ORDER — IRBESARTAN 300 MG PO TABS: 300.0000 mg | ORAL_TABLET | Freq: Every day | ORAL | 3 refills | Status: AC

## 2024-05-17 MED ORDER — AMLODIPINE BESYLATE 10 MG PO TABS: 20.0000 mg | ORAL_TABLET | Freq: Every day | ORAL | 1 refills | Status: DC

## 2024-05-17 MED ORDER — CARVEDILOL 25 MG PO TABS: 25.0000 mg | ORAL_TABLET | Freq: Two times a day (BID) | ORAL | 1 refills | Status: AC

## 2024-05-17 MED ORDER — TIZANIDINE HCL 4 MG PO TABS: 4.0000 mg | ORAL_TABLET | Freq: Every day | ORAL | 0 refills | Status: DC | PRN

## 2024-05-19 ENCOUNTER — Emergency Department (HOSPITAL_COMMUNITY)

## 2024-05-19 ENCOUNTER — Encounter (HOSPITAL_COMMUNITY): Payer: Self-pay

## 2024-05-19 ENCOUNTER — Other Ambulatory Visit: Payer: Self-pay

## 2024-05-19 ENCOUNTER — Observation Stay (HOSPITAL_COMMUNITY)

## 2024-05-19 ENCOUNTER — Inpatient Hospital Stay (HOSPITAL_COMMUNITY)
Admission: EM | Admit: 2024-05-19 | Discharge: 2024-05-25 | DRG: 252 | Disposition: A | Discharge status: Home / Self Care | Attending: Internal Medicine | Admitting: Internal Medicine

## 2024-05-19 ENCOUNTER — Emergency Department (HOSPITAL_BASED_OUTPATIENT_CLINIC_OR_DEPARTMENT_OTHER)

## 2024-05-19 DIAGNOSIS — I120 Hypertensive chronic kidney disease with stage 5 chronic kidney disease or end stage renal disease: Secondary | ICD-10-CM | POA: Diagnosis not present

## 2024-05-19 DIAGNOSIS — M328 Other forms of systemic lupus erythematosus: Secondary | ICD-10-CM | POA: Diagnosis not present

## 2024-05-19 DIAGNOSIS — I5042 Chronic combined systolic (congestive) and diastolic (congestive) heart failure: Secondary | ICD-10-CM

## 2024-05-19 DIAGNOSIS — Z79899 Other long term (current) drug therapy: Secondary | ICD-10-CM

## 2024-05-19 DIAGNOSIS — T82898D Other specified complication of vascular prosthetic devices, implants and grafts, subsequent encounter: Secondary | ICD-10-CM | POA: Diagnosis not present

## 2024-05-19 DIAGNOSIS — T82510D Breakdown (mechanical) of surgically created arteriovenous fistula, subsequent encounter: Secondary | ICD-10-CM | POA: Diagnosis not present

## 2024-05-19 DIAGNOSIS — Z888 Allergy status to other drugs, medicaments and biological substances status: Secondary | ICD-10-CM

## 2024-05-19 DIAGNOSIS — G43909 Migraine, unspecified, not intractable, without status migrainosus: Secondary | ICD-10-CM | POA: Diagnosis present

## 2024-05-19 DIAGNOSIS — Z841 Family history of disorders of kidney and ureter: Secondary | ICD-10-CM

## 2024-05-19 DIAGNOSIS — R0602 Shortness of breath: Secondary | ICD-10-CM | POA: Diagnosis not present

## 2024-05-19 DIAGNOSIS — M109 Gout, unspecified: Secondary | ICD-10-CM | POA: Diagnosis not present

## 2024-05-19 DIAGNOSIS — J811 Chronic pulmonary edema: Secondary | ICD-10-CM | POA: Diagnosis not present

## 2024-05-19 DIAGNOSIS — E871 Hypo-osmolality and hyponatremia: Secondary | ICD-10-CM | POA: Diagnosis not present

## 2024-05-19 DIAGNOSIS — Z91158 Patient's noncompliance with renal dialysis for other reason: Secondary | ICD-10-CM

## 2024-05-19 DIAGNOSIS — I82C11 Acute embolism and thrombosis of right internal jugular vein: Secondary | ICD-10-CM | POA: Diagnosis present

## 2024-05-19 DIAGNOSIS — M329 Systemic lupus erythematosus, unspecified: Secondary | ICD-10-CM | POA: Diagnosis present

## 2024-05-19 DIAGNOSIS — T82898S Other specified complication of vascular prosthetic devices, implants and grafts, sequela: Secondary | ICD-10-CM

## 2024-05-19 DIAGNOSIS — T82868A Thrombosis of vascular prosthetic devices, implants and grafts, initial encounter: Secondary | ICD-10-CM | POA: Diagnosis present

## 2024-05-19 DIAGNOSIS — Z8269 Family history of other diseases of the musculoskeletal system and connective tissue: Secondary | ICD-10-CM

## 2024-05-19 DIAGNOSIS — F17290 Nicotine dependence, other tobacco product, uncomplicated: Secondary | ICD-10-CM | POA: Diagnosis present

## 2024-05-19 DIAGNOSIS — Z720 Tobacco use: Secondary | ICD-10-CM

## 2024-05-19 DIAGNOSIS — M069 Rheumatoid arthritis, unspecified: Secondary | ICD-10-CM | POA: Diagnosis present

## 2024-05-19 DIAGNOSIS — M351 Other overlap syndromes: Secondary | ICD-10-CM | POA: Diagnosis not present

## 2024-05-19 DIAGNOSIS — E872 Acidosis, unspecified: Secondary | ICD-10-CM | POA: Diagnosis not present

## 2024-05-19 DIAGNOSIS — Z992 Dependence on renal dialysis: Secondary | ICD-10-CM | POA: Diagnosis not present

## 2024-05-19 DIAGNOSIS — Z6828 Body mass index (BMI) 28.0-28.9, adult: Secondary | ICD-10-CM

## 2024-05-19 DIAGNOSIS — Z833 Family history of diabetes mellitus: Secondary | ICD-10-CM

## 2024-05-19 DIAGNOSIS — T82848A Pain from vascular prosthetic devices, implants and grafts, initial encounter: Secondary | ICD-10-CM | POA: Diagnosis present

## 2024-05-19 DIAGNOSIS — Z8249 Family history of ischemic heart disease and other diseases of the circulatory system: Secondary | ICD-10-CM

## 2024-05-19 DIAGNOSIS — K219 Gastro-esophageal reflux disease without esophagitis: Secondary | ICD-10-CM | POA: Diagnosis present

## 2024-05-19 DIAGNOSIS — I5023 Acute on chronic systolic (congestive) heart failure: Secondary | ICD-10-CM | POA: Diagnosis not present

## 2024-05-19 DIAGNOSIS — D75A Glucose-6-phosphate dehydrogenase (G6PD) deficiency without anemia: Secondary | ICD-10-CM | POA: Diagnosis not present

## 2024-05-19 DIAGNOSIS — Z4901 Encounter for fitting and adjustment of extracorporeal dialysis catheter: Secondary | ICD-10-CM | POA: Diagnosis not present

## 2024-05-19 DIAGNOSIS — F17210 Nicotine dependence, cigarettes, uncomplicated: Secondary | ICD-10-CM | POA: Diagnosis present

## 2024-05-19 DIAGNOSIS — Z9889 Other specified postprocedural states: Secondary | ICD-10-CM

## 2024-05-19 DIAGNOSIS — M79603 Pain in arm, unspecified: Secondary | ICD-10-CM | POA: Diagnosis not present

## 2024-05-19 DIAGNOSIS — R609 Edema, unspecified: Secondary | ICD-10-CM | POA: Diagnosis not present

## 2024-05-19 DIAGNOSIS — Z87892 Personal history of anaphylaxis: Secondary | ICD-10-CM

## 2024-05-19 DIAGNOSIS — G894 Chronic pain syndrome: Secondary | ICD-10-CM | POA: Diagnosis not present

## 2024-05-19 DIAGNOSIS — I517 Cardiomegaly: Secondary | ICD-10-CM | POA: Diagnosis not present

## 2024-05-19 DIAGNOSIS — I10 Essential (primary) hypertension: Secondary | ICD-10-CM | POA: Diagnosis not present

## 2024-05-19 DIAGNOSIS — Y718 Miscellaneous cardiovascular devices associated with adverse incidents, not elsewhere classified: Secondary | ICD-10-CM | POA: Diagnosis present

## 2024-05-19 DIAGNOSIS — I132 Hypertensive heart and chronic kidney disease with heart failure and with stage 5 chronic kidney disease, or end stage renal disease: Secondary | ICD-10-CM | POA: Diagnosis not present

## 2024-05-19 DIAGNOSIS — Y832 Surgical operation with anastomosis, bypass or graft as the cause of abnormal reaction of the patient, or of later complication, without mention of misadventure at the time of the procedure: Secondary | ICD-10-CM | POA: Diagnosis present

## 2024-05-19 DIAGNOSIS — I5022 Chronic systolic (congestive) heart failure: Secondary | ICD-10-CM

## 2024-05-19 DIAGNOSIS — M79602 Pain in left arm: Secondary | ICD-10-CM | POA: Diagnosis not present

## 2024-05-19 DIAGNOSIS — D631 Anemia in chronic kidney disease: Secondary | ICD-10-CM | POA: Diagnosis not present

## 2024-05-19 DIAGNOSIS — N186 End stage renal disease: Secondary | ICD-10-CM | POA: Diagnosis not present

## 2024-05-19 DIAGNOSIS — I1A0 Resistant hypertension: Secondary | ICD-10-CM | POA: Diagnosis present

## 2024-05-19 DIAGNOSIS — E875 Hyperkalemia: Secondary | ICD-10-CM | POA: Diagnosis not present

## 2024-05-19 DIAGNOSIS — T82898A Other specified complication of vascular prosthetic devices, implants and grafts, initial encounter: Principal | ICD-10-CM | POA: Diagnosis present

## 2024-05-19 DIAGNOSIS — Z765 Malingerer [conscious simulation]: Secondary | ICD-10-CM

## 2024-05-19 DIAGNOSIS — R208 Other disturbances of skin sensation: Secondary | ICD-10-CM | POA: Diagnosis present

## 2024-05-19 DIAGNOSIS — Z885 Allergy status to narcotic agent status: Secondary | ICD-10-CM

## 2024-05-19 DIAGNOSIS — Z86718 Personal history of other venous thrombosis and embolism: Secondary | ICD-10-CM

## 2024-05-19 DIAGNOSIS — R635 Abnormal weight gain: Secondary | ICD-10-CM | POA: Diagnosis present

## 2024-05-19 HISTORY — DX: Glucose-6-phosphate dehydrogenase (G6PD) deficiency without anemia: D75.A

## 2024-05-19 HISTORY — DX: Resistant hypertension: I1A.0

## 2024-05-19 HISTORY — PX: IR TUNNELED CENTRAL VENOUS CATH PLC W IMG: IMG1939

## 2024-05-19 LAB — CBC WITH DIFFERENTIAL/PLATELET
Abs Immature Granulocytes: 0.04 K/uL (ref 0.00–0.07)
Basophils Absolute: 0 K/uL (ref 0.0–0.1)
Basophils Relative: 1 %
Eosinophils Absolute: 0.2 K/uL (ref 0.0–0.5)
Eosinophils Relative: 2 %
HCT: 34.2 % — ABNORMAL LOW (ref 36.0–46.0)
Hemoglobin: 10.1 g/dL — ABNORMAL LOW (ref 12.0–15.0)
Immature Granulocytes: 1 %
Lymphocytes Relative: 25 %
Lymphs Abs: 2.1 K/uL (ref 0.7–4.0)
MCH: 30.1 pg (ref 26.0–34.0)
MCHC: 29.5 g/dL — ABNORMAL LOW (ref 30.0–36.0)
MCV: 102.1 fL — ABNORMAL HIGH (ref 80.0–100.0)
Monocytes Absolute: 1 K/uL (ref 0.1–1.0)
Monocytes Relative: 12 %
Neutro Abs: 5 K/uL (ref 1.7–7.7)
Neutrophils Relative %: 59 %
Platelets: 173 K/uL (ref 150–400)
RBC: 3.35 MIL/uL — ABNORMAL LOW (ref 3.87–5.11)
RDW: 20.4 % — ABNORMAL HIGH (ref 11.5–15.5)
WBC: 8.4 K/uL (ref 4.0–10.5)
nRBC: 0.4 % — ABNORMAL HIGH (ref 0.0–0.2)

## 2024-05-19 LAB — COMPREHENSIVE METABOLIC PANEL WITH GFR
ALT: 16 U/L (ref 0–44)
AST: 24 U/L (ref 15–41)
Albumin: 3 g/dL — ABNORMAL LOW (ref 3.5–5.0)
Alkaline Phosphatase: 80 U/L (ref 38–126)
Anion gap: 15 (ref 5–15)
BUN: 58 mg/dL — ABNORMAL HIGH (ref 6–20)
CO2: 18 mmol/L — ABNORMAL LOW (ref 22–32)
Calcium: 8.8 mg/dL — ABNORMAL LOW (ref 8.9–10.3)
Chloride: 96 mmol/L — ABNORMAL LOW (ref 98–111)
Creatinine, Ser: 9.67 mg/dL — ABNORMAL HIGH (ref 0.44–1.00)
GFR, Estimated: 5 mL/min — ABNORMAL LOW (ref >60)
Glucose, Bld: 72 mg/dL (ref 70–99)
Potassium: 5.1 mmol/L (ref 3.5–5.1)
Sodium: 129 mmol/L — ABNORMAL LOW (ref 135–145)
Total Bilirubin: 1.1 mg/dL (ref 0.0–1.2)
Total Protein: 7.2 g/dL (ref 6.5–8.1)

## 2024-05-19 LAB — MAGNESIUM: Magnesium: 2.2 mg/dL (ref 1.7–2.4)

## 2024-05-19 LAB — HCG, SERUM, QUALITATIVE: Preg, Serum: NEGATIVE

## 2024-05-19 LAB — HEPATITIS B SURFACE ANTIGEN: Hepatitis B Surface Ag: NONREACTIVE

## 2024-05-19 MED ORDER — SEVELAMER CARBONATE 2.4 G PO PACK
2.4000 g | PACK | Freq: Every day | ORAL | Status: DC
  Administered 2024-05-20 – 2024-05-25 (×6): 2.4 g via ORAL
  Filled 2024-05-19 (×7): qty 1

## 2024-05-19 MED ORDER — DIPHENHYDRAMINE HCL 50 MG/ML IJ SOLN
25.0000 mg | Freq: Once | INTRAMUSCULAR | Status: AC
Start: 2024-05-19 — End: 2024-05-19
  Administered 2024-05-19: 25 mg via INTRAVENOUS
  Filled 2024-05-19: qty 1

## 2024-05-19 MED ORDER — CARVEDILOL 25 MG PO TABS
25.0000 mg | ORAL_TABLET | Freq: Two times a day (BID) | ORAL | Status: DC
  Administered 2024-05-19 – 2024-05-25 (×11): 25 mg via ORAL
  Filled 2024-05-19: qty 1
  Filled 2024-05-19 (×2): qty 2
  Filled 2024-05-19: qty 1
  Filled 2024-05-19: qty 2
  Filled 2024-05-19 (×7): qty 1

## 2024-05-19 MED ORDER — MIDAZOLAM HCL 2 MG/2ML IJ SOLN
INTRAMUSCULAR | Status: AC | PRN
  Administered 2024-05-19: 1 mg via INTRAVENOUS

## 2024-05-19 MED ORDER — MIDAZOLAM HCL 2 MG/2ML IJ SOLN
INTRAMUSCULAR | Status: AC
  Filled 2024-05-19: qty 2

## 2024-05-19 MED ORDER — HEPARIN SODIUM (PORCINE) 1000 UNIT/ML IJ SOLN
INTRAMUSCULAR | Status: AC
  Filled 2024-05-19: qty 10

## 2024-05-19 MED ORDER — FUROSEMIDE 40 MG PO TABS
160.0000 mg | ORAL_TABLET | Freq: Two times a day (BID) | ORAL | Status: DC
  Administered 2024-05-19 – 2024-05-25 (×11): 160 mg via ORAL
  Filled 2024-05-19 (×10): qty 4
  Filled 2024-05-19: qty 8

## 2024-05-19 MED ORDER — ONDANSETRON HCL 4 MG/2ML IJ SOLN: 4.0000 mg | Freq: Four times a day (QID) | INTRAMUSCULAR | Status: DC | PRN

## 2024-05-19 MED ORDER — HEPARIN SODIUM (PORCINE) 5000 UNIT/ML IJ SOLN
5000.0000 [IU] | Freq: Three times a day (TID) | INTRAMUSCULAR | Status: DC
  Administered 2024-05-19 – 2024-05-22 (×9): 5000 [IU] via SUBCUTANEOUS
  Filled 2024-05-19 (×13): qty 1

## 2024-05-19 MED ORDER — MIDAZOLAM HCL 2 MG/2ML IJ SOLN
INTRAMUSCULAR | Status: AC | PRN
  Administered 2024-05-19 (×2): 1 mg via INTRAVENOUS

## 2024-05-19 MED ORDER — AMLODIPINE BESYLATE 10 MG PO TABS
10.0000 mg | ORAL_TABLET | Freq: Every day | ORAL | Status: DC
  Administered 2024-05-19 – 2024-05-24 (×6): 10 mg via ORAL
  Filled 2024-05-19: qty 2
  Filled 2024-05-19 (×5): qty 1

## 2024-05-19 MED ORDER — ACETAMINOPHEN 325 MG PO TABS: 650.0000 mg | ORAL_TABLET | Freq: Four times a day (QID) | ORAL | Status: DC | PRN

## 2024-05-19 MED ORDER — CEFAZOLIN SODIUM-DEXTROSE 2-4 GM/100ML-% IV SOLN: 2.0000 g | INTRAVENOUS | Status: DC

## 2024-05-19 MED ORDER — ONDANSETRON HCL 4 MG PO TABS: 4.0000 mg | ORAL_TABLET | Freq: Four times a day (QID) | ORAL | Status: DC | PRN

## 2024-05-19 MED ORDER — CEFAZOLIN SODIUM-DEXTROSE 2-4 GM/100ML-% IV SOLN
INTRAVENOUS | Status: AC | PRN
  Administered 2024-05-19: 2 g via INTRAVENOUS

## 2024-05-19 MED ORDER — ACETAMINOPHEN 650 MG RE SUPP: 650.0000 mg | Freq: Four times a day (QID) | RECTAL | Status: DC | PRN

## 2024-05-19 MED ORDER — FENTANYL CITRATE (PF) 100 MCG/2ML IJ SOLN
INTRAMUSCULAR | Status: AC | PRN
  Administered 2024-05-19 (×4): 50 ug via INTRAVENOUS

## 2024-05-19 MED ORDER — LIDOCAINE-EPINEPHRINE 1 %-1:100000 IJ SOLN
INTRAMUSCULAR | Status: AC
  Filled 2024-05-19: qty 1

## 2024-05-19 MED ORDER — HYDROMORPHONE HCL 1 MG/ML IJ SOLN
0.5000 mg | Freq: Once | INTRAMUSCULAR | Status: AC
  Administered 2024-05-19: 0.5 mg via INTRAVENOUS
  Filled 2024-05-19: qty 1

## 2024-05-19 MED ORDER — TIZANIDINE HCL 4 MG PO TABS
4.0000 mg | ORAL_TABLET | Freq: Every day | ORAL | Status: DC | PRN
  Administered 2024-05-19 – 2024-05-24 (×3): 4 mg via ORAL
  Filled 2024-05-19 (×3): qty 1

## 2024-05-19 MED ORDER — NITROGLYCERIN 0.4 MG SL SUBL: 0.4000 mg | SUBLINGUAL_TABLET | SUBLINGUAL | Status: DC | PRN

## 2024-05-19 MED ORDER — CHLORHEXIDINE GLUCONATE CLOTH 2 % EX PADS
6.0000 | MEDICATED_PAD | Freq: Every day | CUTANEOUS | Status: DC
  Administered 2024-05-20 – 2024-05-25 (×6): 6 via TOPICAL

## 2024-05-19 MED ORDER — CEFAZOLIN SODIUM-DEXTROSE 2-4 GM/100ML-% IV SOLN
INTRAVENOUS | Status: AC
  Filled 2024-05-19: qty 100

## 2024-05-19 MED ORDER — FENTANYL CITRATE (PF) 100 MCG/2ML IJ SOLN
INTRAMUSCULAR | Status: AC
  Filled 2024-05-19: qty 2

## 2024-05-19 MED ORDER — CLONIDINE HCL 0.1 MG PO TABS
0.1000 mg | ORAL_TABLET | Freq: Every day | ORAL | Status: DC
  Administered 2024-05-19: 0.1 mg via ORAL
  Filled 2024-05-19: qty 1

## 2024-05-19 MED ORDER — HYDROMORPHONE HCL 1 MG/ML IJ SOLN
0.5000 mg | INTRAMUSCULAR | Status: DC | PRN
  Administered 2024-05-19 – 2024-05-20 (×5): 0.5 mg via INTRAVENOUS
  Filled 2024-05-19 (×4): qty 1

## 2024-05-19 MED ORDER — MIDAZOLAM HCL 2 MG/2ML IJ SOLN
INTRAMUSCULAR | Status: AC | PRN
  Administered 2024-05-19 (×2): .5 mg via INTRAVENOUS

## 2024-05-19 NOTE — Consult Note (Signed)
 Chief Complaint: Patient was seen in consultation today for clotted AVG  Referring Physician(s): Dr. Ozell Marine  Supervising Physician: Jenna Hacker  Patient Status: [facility] - ED  History of Present Illness: Linda [name] is a 28 y.o. female with GERD, HTN, lupus with end stage renal disease on HD via LUE graft.  Patient presents to [facility] ED after unsuccessful attempt at HD today.  Upper extremity doppler performed and demonstrated an occluded LUE graft.  IR consulted for declot.   Patient assessed at bedside.  She is short of breath due to fluid overload, however saturating well at 100%.  She has been NPO.  States her last full session of HD was Monday, however access at that time was painful to accomplished.  When she returned for her next session her graft was exquisitely tender and she was unable to run more than an hr.  She now presents to ED for painful graft with inability to access for HD today.  She is agreeable to intervention today, however expresses desire for tunneled HD catheter placement due to tenderness in her graft.  She is agreeable to procedure with moderate sedation.     Past Medical History:  Diagnosis Date   Abnormal ECG    a. In setting of LVH w/ repolarization abnormalities; b. 03/2019 MV: No ischemia/infarct. EF 55-65%.   Angioedema    ESRD on hemodialysis (HCC)    GERD (gastroesophageal reflux disease)    Hypertension    Lupus    LVH (left ventricular hypertrophy)    a. 11/2017 Echo: EF 50-55%. triv AI. Mild MR. Mod L and mild R atrial enlargement. Nl RV size/fxn. Small pericardial effusion w/o tampondae; b. 03/2018 Echo: EF 55-60%, restrictive filling pattern. Nl RV size/fxn. Sev LAE. Mild MR, mild to mod TR/PR. Mod PAH. Triv effusion.   Migraines    Mixed connective tissue disease (HCC)    Previous Dialysis patient [facility])    a. Off HD since late 2020.   Septic prepatellar bursitis of right knee 08/19/2017    Past Surgical History:  Procedure  Laterality Date   A/V FISTULAGRAM N/A 03/10/2024   Procedure: A/V Fistulagram;  Surgeon: Serene Gaile ORN, MD;  Location: [facility] INVASIVE CV LAB;  Service: Cardiovascular;  Laterality: N/A;   GRAFT APPLICATION Left 03/30/2019   Procedure: BRACHIAL CEPHALIC GRAFT CREATION;  Surgeon: Marea Selinda RAMAN, MD;  Location: [facility] ORS;  Service: Vascular;  Laterality: Left;   I & D EXTREMITY Right 08/06/2017   Procedure: IRRIGATION AND DEBRIDEMENT KNEE;  Surgeon: Kendal Franky SQUIBB, MD;  Location: [facility] OR;  Service: Orthopedics;  Laterality: Right;   IR THROMBECTOMY AV FISTULA W/THROMBOLYSIS/PTA/STENT INC/SHUNT/IMG LT Left 05/18/2022   IR US  GUIDE VASC ACCESS LEFT  05/18/2022   MULTIPLE TOOTH EXTRACTIONS      Allergies: Iodine , Lisinopril , Gabapentin, Ativan  [lorazepam ], Hydroxychloroquine , and Vicodin [hydrocodone -acetaminophen ]  Medications: Prior to Admission medications   Medication Sig Start Date End Date Taking? Authorizing Provider  acetaminophen  (TYLENOL ) 325 MG tablet Take 2 tablets (650 mg total) by mouth every 6 (six) hours. 03/11/24   Elgergawy, Brayton RAMAN, MD  allopurinol  (ZYLOPRIM ) 100 MG tablet Take 1 tablet (100 mg total) by mouth daily. 03/11/24   Elgergawy, Brayton RAMAN, MD  amLODipine  (NORVASC ) 10 MG tablet Take 2 tablets (20 mg total) by mouth at bedtime. 05/17/24   [name], NP  Blood Pressure KIT Uncontrolled blood pressure 03/11/24   Elgergawy, Brayton RAMAN, MD  Blood Pressure Monitor MISC Use to check blood pressure  03/11/24   Elgergawy, Brayton RAMAN, MD  carvedilol  (COREG ) 25 MG tablet Take 1 tablet (25 mg total) by mouth 2 (two) times daily with a meal. 05/17/24   [name], NP  cloNIDine  (CATAPRES ) 0.1 MG tablet Take 1 tablet (0.1 mg total) by mouth daily. 03/11/24   Elgergawy, Brayton RAMAN, MD  furosemide  (LASIX ) 80 MG tablet Take 1 tablet (80 mg total) by mouth daily. Patient taking differently: Take 160 mg by mouth 2 (two) times daily. 12/23/23   Fairy Frames, MD  irbesartan  (AVAPRO ) 300  MG tablet Take 1 tablet (300 mg total) by mouth daily. 05/17/24   Williamson, Joanna R, NP  lidocaine -prilocaine  (EMLA ) cream Apply 1 Application topically daily as needed (for dialysis). 01/14/24   [provider]  medroxyPROGESTERone  (DEPO-PROVERA ) 150 MG/ML injection Inject 1 mL (150 mg total) into the muscle every 3 (three) months. 10/29/22   Rudy Carlin LABOR, MD  nitroGLYCERIN  (NITROSTAT ) 0.4 MG SL tablet Place 1 tablet (0.4 mg total) under the tongue every 5 (five) minutes as needed for chest pain. 10/28/23   Rai, Nydia POUR, MD  ondansetron  (ZOFRAN ) 4 MG tablet Take 1 tablet (4 mg total) by mouth every 6 (six) hours. 04/06/24   Robinson, John K, PA-C  ondansetron  (ZOFRAN -ODT) 4 MG disintegrating tablet Take 1 tablet (4 mg total) by mouth every 8 (eight) hours as needed for nausea or vomiting. Patient taking differently: Take 4 mg by mouth daily as needed for nausea or vomiting. 10/28/23   Rai, Nydia POUR, MD  oxyCODONE  (ROXICODONE ) 5 MG immediate release tablet Take 1 tablet (5 mg total) by mouth every 4 (four) hours as needed for severe pain (pain score 7-10). 04/07/24   Yolande Lamar BROCKS, MD  sevelamer  carbonate (RENVELA ) 2.4 g PACK Take 2.4 g by mouth daily. 01/21/24   [provider]  tiZANidine  (ZANAFLEX ) 4 MG tablet Take 1 tablet (4 mg total) by mouth daily as needed for muscle spasms. 05/17/24   [name], NP     Family History  Problem Relation Age of Onset   Lupus Mother    Hypertension Mother    Kidney disease Mother    Heart Problems Mother    Coronary artery disease Mother 77       stent   Diabetes Maternal Grandmother     Social History   Socioeconomic History   Marital status: Single    Spouse name: Not on file   Number of children: 0   Years of education: Not on file   Highest education level: Some college, no degree  Occupational History   Not on file  Tobacco Use   Smoking status: Every Day    Current packs/day: 0.25    Average  packs/day: 0.3 packs/day for 9.3 years (2.3 ttl pk-yrs)    Types: E-cigarettes, Cigarettes    Start date: 02/11/2015   Smokeless tobacco: Never   Tobacco comments:    02/03/2024 Patient smokes 3 cigarettes daily  Vaping Use   Vaping status: Every Day  Substance and Sexual Activity   Alcohol use: No    Alcohol/week: 0.0 standard drinks of alcohol   Drug use: No   Sexual activity: Yes    Partners: Male    Birth control/protection: None  Other Topics Concern   Not on file  Social History Narrative   Lives with boyfriend   Social Drivers of Health   Financial Resource Strain: Low Risk  (02/03/2024)   Overall Financial Resource Strain (CARDIA)  Difficulty of Paying Living Expenses: Not hard at all  Food Insecurity: No Food Insecurity (03/08/2024)   Hunger Vital Sign    Worried About Running Out of Food in the Last Year: Never true    Ran Out of Food in the Last Year: Never true  Transportation Needs: No Transportation Needs (03/08/2024)   PRAPARE - Administrator, Civil Service (Medical): No    Lack of Transportation (Non-Medical): No  Recent Concern: Transportation Needs - Unmet Transportation Needs (02/09/2024)   PRAPARE - Administrator, Civil Service (Medical): Yes    Lack of Transportation (Non-Medical): No  Physical Activity: Insufficiently Active (02/03/2024)   Exercise Vital Sign    Days of Exercise per Week: 2 days    Minutes of Exercise per Session: 10 min  Stress: No Stress Concern Present (02/03/2024)   [employer] of Occupational Health - Occupational Stress Questionnaire    Feeling of Stress : Not at all  Social Connections: Socially Isolated (02/03/2024)   Social Connection and Isolation Panel    Frequency of Communication with Friends and Family: Never    Frequency of Social Gatherings with Friends and Family: Once a week    Attends Religious Services: Never    Database administrator or Organizations: Yes    Attends Banker  Meetings: Never    Marital Status: Never married     Review of Systems: A 12 point ROS discussed and pertinent positives are indicated in the HPI above.  All other systems are negative.  Review of Systems  Constitutional:  Negative for fatigue and fever.  Respiratory:  Positive for shortness of breath. Negative for cough.   Cardiovascular:  Negative for chest pain.  Gastrointestinal:  Negative for abdominal pain.  Musculoskeletal:  Negative for back pain.  Skin:        LUE AVG tender  Psychiatric/Behavioral:  Negative for behavioral problems and confusion.     Vital Signs: BP (!) 162/139   Pulse 73   Temp (!) 97.4 F (36.3 C) (Oral)   Resp (!) 24   SpO2 98%   Physical Exam Vitals and nursing note reviewed.  Constitutional:      General: She is not in acute distress.    Appearance: Normal appearance. She is not ill-appearing.  HENT:     Mouth/Throat:     Mouth: Mucous membranes are moist.     Pharynx: Oropharynx is clear.  Cardiovascular:     Rate and Rhythm: Normal rate and regular rhythm.  Pulmonary:     Effort: Pulmonary effort is normal.     Breath sounds: Normal breath sounds.  Abdominal:     General: Abdomen is flat.     Palpations: Abdomen is soft.  Neurological:     General: No focal deficit present.     Mental Status: She is alert and oriented to person, place, and time.  Psychiatric:        Mood and Affect: Mood normal.        Behavior: Behavior normal.        Thought Content: Thought content normal.        Judgment: Judgment normal.     MD Evaluation Airway: WNL Heart: WNL Abdomen: WNL Chest/ Lungs: WNL ASA  Classification: 3   Imaging: VAS US  DUPLEX DIALYSIS ACCESS (AVF, AVG) Result Date: 05/19/2024 DIALYSIS ACCESS Patient Name:  ANGELLA MONTAS Talamo  Date of Exam:   05/19/2024 Medical Rec #: [id number]  Accession #:    [id number] Date of Birth: [birth year]              Patient Gender: F Patient Age:   42 years Exam Location:  [facility] Procedure:      VAS US  DUPLEX DIALYSIS ACCESS (AVF, AVG) Referring Phys: MICHAEL PENNA --------------------------------------------------------------------------------  Reason for Exam: No palpable thrill for AVF/AVG. Access Site: Left Upper Extremity. Access Type: Brachial-Axillary. History: Stent in axilla. Pain during dialysis session on Wednesday after nurse          stuck fistula lower than normal. Patient halted session. Presents with          increased pain and swelling of left upper extremity and diminished          thrill. Limitations: Pain with touch and swelling Comparison Study: No prior study Performing Technologist: Alberta Lis RVS  Examination Guidelines: A complete evaluation includes B-mode imaging, spectral Doppler, color Doppler, and power Doppler as needed of all accessible portions of each vessel. Unilateral testing is considered an integral part of a complete examination. Limited examinations for reoccurring indications may be performed as noted.     Summary: Stent within upper arm appears occluded. Minimal flow noted throughout left upper extremity fistula.     --------------------------------------------------------------------------------   Preliminary    DG Chest Portable 1 View Result Date: 05/19/2024 CLINICAL DATA:  Left upper extremity pain and edema. History of dialysis. EXAM: PORTABLE CHEST 1 VIEW COMPARISON:  May 14, 2024. FINDINGS: Stable cardiomegaly. Both lungs are clear. The visualized skeletal structures are unremarkable. IMPRESSION: No active disease. Electronically Signed   By: Lynwood Landy Raddle M.D.   On: 05/19/2024 08:06   DG Chest 2 View Result Date: 05/14/2024 EXAM: 2 VIEW(S) XRAY OF THE CHEST 05/14/2024 05:25:32 AM COMPARISON: 04/07/2024 CLINICAL HISTORY: Pt in with reported sob and cough that began last night. Dialysis pt, last session Friday. Also reporting some L back pain. FINDINGS: LUNGS AND PLEURA: Pulmonary edema, mild. No focal pulmonary  opacity. No pleural effusion. No pneumothorax. HEART AND MEDIASTINUM: Cardiomegaly. No acute abnormality of the cardiac and mediastinal silhouettes. BONES AND SOFT TISSUES: No acute osseous abnormality. Vascular stent in left upper arm. IMPRESSION: 1. Cardiomegaly. 2. Mild pulmonary edema. Electronically signed by: Waddell Calk MD 05/14/2024 05:59 AM EDT RP Workstation: [id number]   CT ABDOMEN PELVIS WO CONTRAST Result Date: 04/28/2024 EXAM: CT ABDOMEN AND PELVIS WITHOUT CONTRAST 04/28/2024 05:58:43 AM TECHNIQUE: CT of the abdomen and pelvis was performed without the administration of intravenous contrast. Multiplanar reformatted images are provided for review. Automated exposure control, iterative reconstruction, and/or weight-based adjustment of the mA/kV was utilized to reduce the radiation dose to as low as reasonably achievable. COMPARISON: 04/24/2024 CLINICAL HISTORY: Abdominal/flank pain, stone suspected. Back pain. FINDINGS: LOWER CHEST: Mild atelectasis noted within the lung bases. No pleural fluid or consolidative change. LIVER: Normal size and contour. GALLBLADDER AND BILE DUCTS: No wall thickening. No cholelithiasis. No biliary ductal dilatation. SPLEEN: Normal size. No focal lesion. PANCREAS: No mass. No ductal dilatation. ADRENAL GLANDS: Normal appearance. No mass. KIDNEYS, URETERS AND BLADDER: No stones in the kidneys or ureters. No hydronephrosis. No perinephric or periureteral stranding. Urinary bladder is unremarkable. GI AND BOWEL: The appendix is visualized and appears normal. Mild retained stool within the ascending colon and rectum. There is no bowel obstruction. No bowel wall thickening. PERITONEUM AND RETROPERITONEUM: No ascites. No free air. VASCULATURE: Age advanced aortic atherosclerotic calcification. LYMPH NODES: No lymphadenopathy. REPRODUCTIVE ORGANS: Uterus and adnexal structures appear  normal. BONES AND SOFT TISSUES: Unchanged bilateral L5 pars defects with minimal, grade 1  anterolisthesis of L5 on S1. No acute osseous finding. Extensive soft tissue calcifications identified in the posterior gluteal regions and lower back. IMPRESSION: 1. No acute findings in the abdomen or pelvis. 2. Cardiomegaly and age-advanced aortic atherosclerotic calcification. Electronically signed by: Waddell Calk MD 04/28/2024 06:31 AM EDT RP Workstation: [id number]    Labs:  CBC: Recent Labs    04/07/24 1215 04/28/24 0505 05/14/24 0504 05/19/24 0757  WBC 8.8 7.3 7.2 8.4  HGB 10.8* 11.4* 11.3* 10.1*  HCT 35.6* 37.0 37.0 34.2*  PLT 248 190 128* 173    COAGS: Recent Labs    10/10/23 0201 04/07/24 1215  INR 1.2 1.1  APTT 36  --     BMP: Recent Labs    04/07/24 1215 04/28/24 0505 05/14/24 0504 05/19/24 0757  NA 137 134* 129* 129*  K 3.9 5.6* 5.2* 5.1  CL 95* 92* 92* 96*  CO2 27 23 21* 18*  GLUCOSE 81 76 85 72  BUN 19 45* 35* 58*  CALCIUM  9.5 9.7 9.1 8.8*  CREATININE 5.38* 9.96* 7.46* 9.67*  GFRNONAA 10* 5* 7* 5*    LIVER FUNCTION TESTS: Recent Labs    04/06/24 0337 04/07/24 1215 05/14/24 0504 05/19/24 0757  BILITOT 0.9 0.8 1.0 1.1  AST 25 26 24 24   ALT 18 19 13 16   ALKPHOS 111 104 69 80  PROT 9.1* 9.2* 7.6 7.2  ALBUMIN  3.4* 3.4* 3.2* 3.0*    TUMOR MARKERS: No results for input(s): AFPTM, CEA, CA199, CHROMGRNA in the last 8760 hours.  Assessment and Plan: Clotted LUE AVG.   Patient with history of ESRD on HD via LUE AVG.  Patient known to IR from previous intervention on this graft in 05/18/2022 at which time extensive intervention was performed to include tPA, stent placement.   At that time it was also noted that patient had increased discomfort with recommendation for general anesthesia if declot required in the future.   Of note, patient with possible contrast allergy with documented iodine  allergy as of 10/2023.   Discussed procedure with patient at bedside.  She is agreeable to tunneled dialysis catheter placement today with  moderate sedation.  Potassium 5.1  IF further intervention required, may need to consider with anesthesia and contrast allergy premedication.   Patient requests tunneled HD catheter placement today due to exquiste tenderness with her graft today.   Risks and benefits discussed with the patient including, but not limited to bleeding, infection, vascular injury, pulmonary embolism, need for tunneled HD catheter placement or even death.  All of the patient's questions were answered, patient is agreeable to proceed. Consent signed and in chart.   Thank you for this interesting consult.  I greatly enjoyed meeting [facility] and look forward to participating in their care.  A copy of this report was sent to the requesting provider on this date.  Electronically Signed: Nyzier Boivin Sue-Ellen Diem Dicocco, PA 05/19/2024, 1:47 PM   I spent a total of 40 Minutes    in face to face in clinical consultation, greater than 50% of which was counseling/coordinating care for clotted LUE AVG.

## 2024-05-19 NOTE — Assessment & Plan Note (Addendum)
 On admission 05-19-2024 Noted worsening left upper extremity redness pain and swelling associated with vascular graft No discernible thrill or bruit on auscultation In discussion with Dr. Pamella, IR and vascular surgery have been consulted to further evaluate Upper extremity ultrasound pending Pain control Follow-up with vascular and IR recommendations in the interim Follow-up Prior to 05/24/24 had AVF declot on 05-23-2024 and her temporary dialysis catheter removed  05-24-2024 stable for DC to home per nephrology.  05-25-2024. Pt refused to go home last night despite being told she was going to be discharged after HD yesterday evening. Pt is stable for discharge

## 2024-05-19 NOTE — ED Notes (Signed)
 Patient transported to Ultrasound

## 2024-05-19 NOTE — Procedures (Signed)
 Interventional Radiology Procedure Note  Procedure: Tunneled HD line placement  Complications: None  Estimated Blood Loss: < 10 mL  Findings: Hemodialysis tunneled catheter placed.   Cordella DELENA Banner, MD

## 2024-05-19 NOTE — Assessment & Plan Note (Addendum)
 On admission 05-19-2024. Baseline ESRD on hemodialysis Monday Wednesday Friday Positive concurrent left upper extremity graft occlusion pending IR/vascular surgery intervention Had extra session of dialysis earlier this week per patient  Renal function appears near baseline Appears euvolemic  Consult nephrology Monitor Prior to 05/24/24 had temporary HD catheter placed on 05-19-2024. Had HD on 05-20-2024, 05-23-2024  05/24/24 pt usually get outpatient HD at [facility] with 7 AM chair time. Discussed with Dr. Tobie with nephrology. He will order HD for today and pt can be discharged to home after HD. Pt will resume outpatient HD this coming Friday.

## 2024-05-19 NOTE — Assessment & Plan Note (Addendum)
 On admission 05-19-2024 Baseline discoid SLE Followed by [facility] No longer on thalidomide or plaquenil   Monitor  Prior to 05/24/24 Follows with [facility], Dr. Ziolkowska, not on disease modifying therapy at this time

## 2024-05-19 NOTE — Plan of Care (Signed)
   Problem: Education: Goal: Knowledge of General Education information will improve Description Including pain rating scale, medication(s)/side effects and non-pharmacologic comfort measures Outcome: Progressing

## 2024-05-19 NOTE — Assessment & Plan Note (Addendum)
 On admission 05-19-2024 2D echo July 2025 with EF of 30 to 35% Appears fairly euvolemic to dry on presentation Noted dialysis dependent volume status though does make a small amount of urine Weight today 68 kg Monitor volume status closely with treatment Follow  Prior to 05/24/24 Presented with swelling and shortness of breath, due to missed dialysis.  Treated with dialysis. -Volume management by HD - Continue home carvedilol , Lasix , irbesartan   05/24/24 resolved acute on chronic systolic CHF. Present on admission.

## 2024-05-19 NOTE — Consult Note (Addendum)
 Renal Service Consult Note [facility] [name], MD  Patient: Linda [name] Date: [birth year] Requesting Physician: Dr. Eldonna  Reason for Consult: ESRD pt w/ clotted access HPI: The patient is a 28 y.o. year-old w/ PMH as below who presented to ED sent from dialysis for clotted access. Pt went to her usual HD today and did not get any dialysis due to failed access. Had HD this week Mon-Tues-Wed, Tuesday HD was extra for vol overload. Pt was admitted and IR consulted for HD access. We are asked to see for ESRD.     Pt seen in HD unit. CXR 9/19 showed no acute disease, CM. Has been coming off sometimes under dry wt and it was lowered recently by 0.5- 1.0kg. Denies any cough, orthopnea. States she was very SOB on presentation, not any more.    ROS - denies CP, no joint pain, no HA, no blurry vision, no rash, no diarrhea, no nausea/ vomiting   Past Medical History  Past Medical History:  Diagnosis Date   Abnormal ECG    a. In setting of LVH w/ repolarization abnormalities; b. 03/2019 MV: No ischemia/infarct. EF 55-65%.   Angioedema    ESRD on hemodialysis (HCC)    GERD (gastroesophageal reflux disease)    Hypertension    Lupus    LVH (left ventricular hypertrophy)    a. 11/2017 Echo: EF 50-55%. triv AI. Mild MR. Mod L and mild R atrial enlargement. Nl RV size/fxn. Small pericardial effusion w/o tampondae; b. 03/2018 Echo: EF 55-60%, restrictive filling pattern. Nl RV size/fxn. Sev LAE. Mild MR, mild to mod TR/PR. Mod PAH. Triv effusion.   Migraines    Mixed connective tissue disease (HCC)    Previous Dialysis patient [facility])    a. Off HD since late 2020.   Septic prepatellar bursitis of right knee 08/19/2017   Past Surgical History  Past Surgical History:  Procedure Laterality Date   A/V FISTULAGRAM N/A 03/10/2024   Procedure: A/V Fistulagram;  Surgeon: Serene Gaile ORN, MD;  Location: [facility] INVASIVE CV LAB;  Service: Cardiovascular;  Laterality: N/A;   GRAFT  APPLICATION Left 03/30/2019   Procedure: BRACHIAL CEPHALIC GRAFT CREATION;  Surgeon: Marea Selinda RAMAN, MD;  Location: [facility] ORS;  Service: Vascular;  Laterality: Left;   I & D EXTREMITY Right 08/06/2017   Procedure: IRRIGATION AND DEBRIDEMENT KNEE;  Surgeon: Kendal Franky SQUIBB, MD;  Location: [facility] OR;  Service: Orthopedics;  Laterality: Right;   IR THROMBECTOMY AV FISTULA W/THROMBOLYSIS/PTA/STENT INC/SHUNT/IMG LT Left 05/18/2022   IR US  GUIDE VASC ACCESS LEFT  05/18/2022   MULTIPLE TOOTH EXTRACTIONS     Family History  Family History  Problem Relation Age of Onset   Lupus Mother    Hypertension Mother    Kidney disease Mother    Heart Problems Mother    Coronary artery disease Mother 79       stent   Diabetes Maternal Grandmother    Social History  reports that she has been smoking e-cigarettes and cigarettes. She started smoking about 9 years ago. She has a 2.3 pack-year smoking history. She has never used smokeless tobacco. She reports that she does not drink alcohol and does not use drugs. Allergies  Allergies  Allergen Reactions   Iodine  Hives and Shortness Of Breath   Lisinopril  Anaphylaxis    Angioedema   Gabapentin Other (See Comments)    Reaction: Tremor (intolerance); tremor   Ativan  [Lorazepam ] Other (See Comments)    Somnolent with 1mg  ativan   at [facility] Nov 2019. Patient stated it made her feel like she shut down and was mute and non-responding.   Hydroxychloroquine  Other (See Comments)    Pt reports making her skin peel really bad   Vicodin [Hydrocodone -Acetaminophen ] Itching and Rash   Home medications Prior to Admission medications   Medication Sig Start Date End Date Taking? Authorizing Provider  acetaminophen  (TYLENOL ) 325 MG tablet Take 2 tablets (650 mg total) by mouth every 6 (six) hours. 03/11/24   Elgergawy, Brayton RAMAN, MD  allopurinol  (ZYLOPRIM ) 100 MG tablet Take 1 tablet (100 mg total) by mouth daily. 03/11/24   Elgergawy, Brayton RAMAN, MD  amLODipine  (NORVASC ) 10 MG  tablet Take 2 tablets (20 mg total) by mouth at bedtime. 05/17/24   [name], NP  Blood Pressure KIT Uncontrolled blood pressure 03/11/24   Elgergawy, Brayton RAMAN, MD  Blood Pressure Monitor MISC Use to check blood pressure 03/11/24   Elgergawy, Brayton RAMAN, MD  carvedilol  (COREG ) 25 MG tablet Take 1 tablet (25 mg total) by mouth 2 (two) times daily with a meal. 05/17/24   [name], NP  cloNIDine  (CATAPRES ) 0.1 MG tablet Take 1 tablet (0.1 mg total) by mouth daily. 03/11/24   Elgergawy, Brayton RAMAN, MD  furosemide  (LASIX ) 80 MG tablet Take 1 tablet (80 mg total) by mouth daily. Patient taking differently: Take 160 mg by mouth 2 (two) times daily. 12/23/23   Fairy Frames, MD  irbesartan  (AVAPRO ) 300 MG tablet Take 1 tablet (300 mg total) by mouth daily. 05/17/24   Williamson, Joanna R, NP  lidocaine -prilocaine  (EMLA ) cream Apply 1 Application topically daily as needed (for dialysis). 01/14/24   [provider]  medroxyPROGESTERone  (DEPO-PROVERA ) 150 MG/ML injection Inject 1 mL (150 mg total) into the muscle every 3 (three) months. 10/29/22   Rudy Carlin LABOR, MD  nitroGLYCERIN  (NITROSTAT ) 0.4 MG SL tablet Place 1 tablet (0.4 mg total) under the tongue every 5 (five) minutes as needed for chest pain. 10/28/23   Rai, Nydia POUR, MD  ondansetron  (ZOFRAN ) 4 MG tablet Take 1 tablet (4 mg total) by mouth every 6 (six) hours. 04/06/24   Robinson, John K, PA-C  ondansetron  (ZOFRAN -ODT) 4 MG disintegrating tablet Take 1 tablet (4 mg total) by mouth every 8 (eight) hours as needed for nausea or vomiting. Patient taking differently: Take 4 mg by mouth daily as needed for nausea or vomiting. 10/28/23   Rai, Nydia POUR, MD  oxyCODONE  (ROXICODONE ) 5 MG immediate release tablet Take 1 tablet (5 mg total) by mouth every 4 (four) hours as needed for severe pain (pain score 7-10). 04/07/24   Yolande Lamar BROCKS, MD  sevelamer  carbonate (RENVELA ) 2.4 g PACK Take 2.4 g by mouth daily. 01/21/24   [provider]  tiZANidine  (ZANAFLEX ) 4 MG tablet Take 1 tablet (4 mg total) by mouth daily as needed for muscle spasms. 05/17/24   [name], NP     Vitals:   05/19/24 1400 05/19/24 1416 05/19/24 1521 05/19/24 1600  BP: (!) 160/135  (!) 169/138 (!) 169/141  Pulse: 74  99 81  Resp: 15  19 18   Temp:  98.5 F (36.9 C) 98.3 F (36.8 C)   TempSrc:  Oral Oral   SpO2: 100%  93% 100%   Exam Gen alert, no distress Sclera anicteric, throat clear  No jvd or bruits Chest clear bilat to bases RRR no MRG Abd soft ntnd no mass or ascites +bs Ext no LE or UE edema, no  other edema Neuro is alert, Ox 3 , nf    LUA AVG - no bruit     RIJ TDC intact (new)  Home bp meds: Norvasc  20mg  hs Coreg  25 bid Clonidine  0.1mg  every day Lasix  80mg  every day Irbesartan  300 mg every day    OP HD: SW MWF 3.5h   B400   68.4kg   2K bath   AVG   Heparin  3000 Gets to dry wt about every other HD, o/w is 1- 4kg over Had extra HD 9/16 Tuesday for vol excess Last HD 9/17 was only 30 min long, left 70.6kg  Last Hb 11.3 on 9/17, not on esa's  Assessment/ Plan: Clotted HD access: IR consulted yesterday and placed RIJ TDC. Still will need AVG declotted, have d/w pmd.   ESRD: on HD MWF. HD today off schedule this morning. Next HD Monday.  Resistant HTN: Bp 180/130 here, on multiple BP medications, continue. Has outpt appt w/ HTN specialist coming up.  Volume: CXR here was negative for edema. Pt came off 2kg over on Wed. Need a good standing wts, last wt suggests 7-10 kg up. Wean off O2 as tolerated. Post wt standing wt in kg was 74.2kg (6kg over) Anemia of esrd: Hb 10-12 here, follow. Not getting esa at op unit.   H/o MCTD       Myer Fret  MD [facility] 05/20/2024, 3:03 PM  Recent Labs  Lab 05/14/24 0504 05/19/24 0757  HGB 11.3* 10.1*  ALBUMIN  3.2* 3.0*  CALCIUM  9.1 8.8*  CREATININE 7.46* 9.67*  K 5.2* 5.1   Inpatient medications:  amLODipine   10 mg Oral QHS   carvedilol   25 mg Oral BID WC    cloNIDine   0.1 mg Oral Daily   furosemide   160 mg Oral BID   heparin   5,000 Units Subcutaneous Q8H   [START ON 05/20/2024] sevelamer  carbonate  2.4 g Oral Q breakfast     ceFAZolin  (ANCEF ) IV     ceFAZolin      acetaminophen  **OR** acetaminophen , ceFAZolin , HYDROmorphone  (DILAUDID ) injection, midazolam , nitroGLYCERIN , ondansetron  **OR** ondansetron  (ZOFRAN ) IV, tiZANidine 

## 2024-05-19 NOTE — Progress Notes (Signed)
 VASCULAR LAB    Left upper extremity duplex to evaluate fistula has been performed.  See CV proc for preliminary results.  Relayed results to Dr. Pamella via secure chat  RACHEL PELLET, RVT 05/19/2024, 11:32 AM

## 2024-05-19 NOTE — Assessment & Plan Note (Addendum)
 On admission 05-19-2024 smoker daily Discussed cessation at length Monitor

## 2024-05-19 NOTE — ED Triage Notes (Signed)
 BIB EMS from dialysis r/t increased left arm pain localized fistula. Not treatment today d/t pain. Dialysis M/W/F. Extra treatment Tuesday d/t fluid overload. Endorsing N/V/D. Noted swelling to left fistula site. A&Ox4.

## 2024-05-19 NOTE — Assessment & Plan Note (Addendum)
 On admission 05-19-2024 Recalcitrant hypertension despite multiple medications Blood pressure 140s to 160s over 100s Continue home regimen including clonidine , Coreg , Norvasc  Titrate as appropriate Follow  Prior to 05/24/24 - Continue amlodipine , carvedilol , clonidine , furosemide , irbesartan , new doxazosin    05/24/24 remains on - Continue amlodipine , carvedilol , clonidine , furosemide , irbesartan , doxazosin 

## 2024-05-19 NOTE — H&P (Addendum)
 History and Physical    Patient: Linda [name] FMW:[id number] DOB: 1996-08-28 DOA: 05/19/2024 DOS: the patient was seen and examined on 05/19/2024 PCP: [name], NP  Patient coming from: Dialysis   Chief Complaint:  Chief Complaint  Patient presents with   Arm Pain    BIB EMS from dialysis r/t increased left arm pain localized fistula. Not treatment today d/t pain. Dialysis M/W/F. Extra treatment Tuesday d/t fluid overload. Endorsing N/V/D. Noted swelling to left fistula site. A&Ox4.    HPI: Linda [name] is a 28 y.o. female with medical history significant of SLE, ESRD on hemodialysis, hypertension, HFrEF, rheumatoid arthritis presenting with vascular graft occlusion.  Patient with noted baseline history of ESRD on hemodialysis Monday Wednesday Friday.  Noted recent admission 02/2024 for issues including volume overload and respiratory failure requiring intubation. Patient states that she is compliant with HD. Patient states she is going on a regular course of time this week including an extra session on Tuesday for concern for volume overload.  Reports a roughly 20 pound weight gain within the past week or so that is well above baseline.  Patient states she noticed some mild left upper extremity pain redness and swelling over the past 3 days.  Patient went for dialysis today and facility was unable to draw blood from the graft area.  Reports worsening redness and swelling over the affected area.  Patient denies any prior episodes like this in the past.  Denies any recent trauma to the affected area.  Denies any prior episodes like this in the past.  Does still make a trace amount of urine on a regular basis in the setting of ESRD.  Reports compliance overall with hemodialysis as well as medication regimen.  Was unable to obtain dialysis today secondary to graft occlusion.  Denies any prior episodes like this in the past.  Smokes 1 black a mile per day.  Is followed by [facility] rheumatology. Presented to the ER afebrile, blood pressures 140s to 160s over 100s.  Satting well on room air.  White count 8.4, hemoglobin 10.1, platelets 173, creatinine 9.67, platelets 258.  Bicarb 18.  Chest x-ray stable.  Vascular ultrasound duplex of dialysis catheter showing positive occlusion.  Interventional radiology consulted for further evaluation. Review of Systems: As mentioned in the history of present illness. All other systems reviewed and are negative. Past Medical History:  Diagnosis Date   Abnormal ECG    a. In setting of LVH w/ repolarization abnormalities; b. 03/2019 MV: No ischemia/infarct. EF 55-65%.   Angioedema    ESRD on hemodialysis (HCC)    GERD (gastroesophageal reflux disease)    Hypertension    Lupus    LVH (left ventricular hypertrophy)    a. 11/2017 Echo: EF 50-55%. triv AI. Mild MR. Mod L and mild R atrial enlargement. Nl RV size/fxn. Small pericardial effusion w/o tampondae; b. 03/2018 Echo: EF 55-60%, restrictive filling pattern. Nl RV size/fxn. Sev LAE. Mild MR, mild to mod TR/PR. Mod PAH. Triv effusion.   Migraines    Mixed connective tissue disease (HCC)    Previous Dialysis patient [facility])    a. Off HD since late 2020.   Septic prepatellar bursitis of right knee 08/19/2017   Past Surgical History:  Procedure Laterality Date   A/V FISTULAGRAM N/A 03/10/2024   Procedure: A/V Fistulagram;  Surgeon: Serene Gaile ORN, MD;  Location: [facility] INVASIVE CV LAB;  Service: Cardiovascular;  Laterality: N/A;   GRAFT APPLICATION Left 03/30/2019  Procedure: BRACHIAL CEPHALIC GRAFT CREATION;  Surgeon: Marea Selinda RAMAN, MD;  Location: [facility] ORS;  Service: Vascular;  Laterality: Left;   I & D EXTREMITY Right 08/06/2017   Procedure: IRRIGATION AND DEBRIDEMENT KNEE;  Surgeon: Kendal Franky SQUIBB, MD;  Location: [facility] OR;  Service: Orthopedics;  Laterality: Right;   IR THROMBECTOMY AV FISTULA W/THROMBOLYSIS/PTA/STENT INC/SHUNT/IMG LT Left 05/18/2022   IR US  GUIDE VASC  ACCESS LEFT  05/18/2022   MULTIPLE TOOTH EXTRACTIONS     Social History:  reports that she has been smoking e-cigarettes and cigarettes. She started smoking about 9 years ago. She has a 2.3 pack-year smoking history. She has never used smokeless tobacco. She reports that she does not drink alcohol and does not use drugs.  Allergies  Allergen Reactions   Iodine  Hives and Shortness Of Breath   Lisinopril  Anaphylaxis    Angioedema   Gabapentin Other (See Comments)    Reaction: Tremor (intolerance); tremor   Ativan  [Lorazepam ] Other (See Comments)    Somnolent with 1mg  ativan  at [facility] Nov 2019. Patient stated it made her feel like she shut down and was mute and non-responding.   Hydroxychloroquine  Other (See Comments)    Pt reports making her skin peel really bad   Vicodin [Hydrocodone -Acetaminophen ] Itching and Rash    Family History  Problem Relation Age of Onset   Lupus Mother    Hypertension Mother    Kidney disease Mother    Heart Problems Mother    Coronary artery disease Mother 32       stent   Diabetes Maternal Grandmother     Prior to Admission medications   Medication Sig Start Date End Date Taking? Authorizing Provider  acetaminophen  (TYLENOL ) 325 MG tablet Take 2 tablets (650 mg total) by mouth every 6 (six) hours. 03/11/24   Elgergawy, Brayton RAMAN, MD  allopurinol  (ZYLOPRIM ) 100 MG tablet Take 1 tablet (100 mg total) by mouth daily. 03/11/24   Elgergawy, Brayton RAMAN, MD  amLODipine  (NORVASC ) 10 MG tablet Take 2 tablets (20 mg total) by mouth at bedtime. 05/17/24   [name], NP  Blood Pressure KIT Uncontrolled blood pressure 03/11/24   Elgergawy, Brayton RAMAN, MD  Blood Pressure Monitor MISC Use to check blood pressure 03/11/24   Elgergawy, Brayton RAMAN, MD  carvedilol  (COREG ) 25 MG tablet Take 1 tablet (25 mg total) by mouth 2 (two) times daily with a meal. 05/17/24   [name], NP  cloNIDine  (CATAPRES ) 0.1 MG tablet Take 1 tablet (0.1 mg total) by mouth daily.  03/11/24   Elgergawy, Brayton RAMAN, MD  furosemide  (LASIX ) 80 MG tablet Take 1 tablet (80 mg total) by mouth daily. Patient taking differently: Take 160 mg by mouth 2 (two) times daily. 12/23/23   Fairy Frames, MD  irbesartan  (AVAPRO ) 300 MG tablet Take 1 tablet (300 mg total) by mouth daily. 05/17/24   Williamson, Joanna R, NP  lidocaine -prilocaine  (EMLA ) cream Apply 1 Application topically daily as needed (for dialysis). 01/14/24   [provider]  medroxyPROGESTERone  (DEPO-PROVERA ) 150 MG/ML injection Inject 1 mL (150 mg total) into the muscle every 3 (three) months. 10/29/22   Rudy Carlin LABOR, MD  nitroGLYCERIN  (NITROSTAT ) 0.4 MG SL tablet Place 1 tablet (0.4 mg total) under the tongue every 5 (five) minutes as needed for chest pain. 10/28/23   Rai, Nydia POUR, MD  ondansetron  (ZOFRAN ) 4 MG tablet Take 1 tablet (4 mg total) by mouth every 6 (six) hours. 04/06/24   Robinson, John K, PA-C  ondansetron  (ZOFRAN -ODT) 4 MG disintegrating tablet Take 1 tablet (4 mg total) by mouth every 8 (eight) hours as needed for nausea or vomiting. Patient taking differently: Take 4 mg by mouth daily as needed for nausea or vomiting. 10/28/23   Rai, Nydia POUR, MD  oxyCODONE  (ROXICODONE ) 5 MG immediate release tablet Take 1 tablet (5 mg total) by mouth every 4 (four) hours as needed for severe pain (pain score 7-10). 04/07/24   Yolande Lamar BROCKS, MD  sevelamer  carbonate (RENVELA ) 2.4 g PACK Take 2.4 g by mouth daily. 01/21/24   [provider]  tiZANidine  (ZANAFLEX ) 4 MG tablet Take 1 tablet (4 mg total) by mouth daily as needed for muscle spasms. 05/17/24   [name], NP    Physical Exam: Vitals:   05/19/24 1300 05/19/24 1345 05/19/24 1400 05/19/24 1416  BP: (!) 151/133 (!) 163/132 (!) 160/135   Pulse: 72 75 74   Resp: 19 20 15    Temp:    98.5 F (36.9 C)  TempSrc:    Oral  SpO2: 100% 100% 100%    Physical Exam Constitutional:      Appearance: She is normal weight.  HENT:     Head:  Normocephalic and atraumatic.     Mouth/Throat:     Mouth: Mucous membranes are moist.  Eyes:     Pupils: Pupils are equal, round, and reactive to light.  Cardiovascular:     Rate and Rhythm: Normal rate and regular rhythm.  Pulmonary:     Effort: Pulmonary effort is normal.  Abdominal:     General: Bowel sounds are normal.  Musculoskeletal:        General: Normal range of motion.     Cervical back: Normal range of motion.     Comments: No thrill or bruit noted in LUE graft  Skin:    General: Skin is warm.  Neurological:     Mental Status: Mental status is at baseline.     Data Reviewed:  There are no new results to review at this time.  VAS US  DUPLEX DIALYSIS ACCESS (AVF, AVG) DIALYSIS ACCESS  Patient Name:  Linda [name]  Date of Exam:   05/19/2024 Medical Rec #: [id number]              Accession #:    [id number] Date of Birth: Mar 19, 1996              Patient Gender: F Patient Age:   66 years Exam Location:  [facility] Procedure:      VAS US  DUPLEX DIALYSIS ACCESS (AVF, AVG) Referring Phys: MICHAEL PENNA  --------------------------------------------------------------------------------   Reason for Exam: No palpable thrill for AVF/AVG.  Access Site: Left Upper Extremity.  Access Type: Brachial-Axillary.  History: Stent in axilla. Pain during dialysis session on Wednesday after nurse          stuck fistula lower than normal. Patient halted session. Presents with          increased pain and swelling of left upper extremity and diminished          thrill.  Limitations: Pain with touch and swelling  Comparison Study: No prior study  Performing Technologist: Alberta Lis RVS    Examination Guidelines: A complete evaluation includes B-mode imaging, spectral Doppler, color Doppler, and power Doppler as needed of all accessible portions of each vessel. Unilateral testing is considered an integral part of a complete examination. Limited examinations  for reoccurring indications may be performed as noted.  Summary: Stent within upper arm appears occluded. Minimal flow noted throughout left upper extremity fistula.            --------------------------------------------------------------------------------    Preliminary   DG Chest Portable 1 View CLINICAL DATA:  Left upper extremity pain and edema. History of dialysis.  EXAM: PORTABLE CHEST 1 VIEW  COMPARISON:  May 14, 2024.  FINDINGS: Stable cardiomegaly. Both lungs are clear. The visualized skeletal structures are unremarkable.  IMPRESSION: No active disease.  Electronically Signed   By: Lynwood Landy Raddle M.D.   On: 05/19/2024 08:06  Lab Results  Component Value Date   WBC 8.4 05/19/2024   HGB 10.1 (L) 05/19/2024   HCT 34.2 (L) 05/19/2024   MCV 102.1 (H) 05/19/2024   PLT 173 05/19/2024   Last metabolic panel Lab Results  Component Value Date   GLUCOSE 72 05/19/2024   NA 129 (L) 05/19/2024   K 5.1 05/19/2024   CL 96 (L) 05/19/2024   CO2 18 (L) 05/19/2024   BUN 58 (H) 05/19/2024   CREATININE 9.67 (H) 05/19/2024   GFRNONAA 5 (L) 05/19/2024   CALCIUM  8.8 (L) 05/19/2024   PHOS 8.6 (H) 03/10/2024   PROT 7.2 05/19/2024   ALBUMIN  3.0 (L) 05/19/2024   BILITOT 1.1 05/19/2024   ALKPHOS 80 05/19/2024   AST 24 05/19/2024   ALT 16 05/19/2024   ANIONGAP 15 05/19/2024    Assessment and Plan: * Vascular graft occlusion (HCC) Noted worsening left upper extremity redness pain and swelling associated with vascular graft No discernible thrill or bruit on auscultation In discussion with Dr. Pamella, IR and vascular surgery have been consulted to further evaluate Upper extremity ultrasound pending Pain control Follow-up with vascular and IR recommendations in the interim Follow-up  ESRD (end stage renal disease) on dialysis [facility]) Baseline ESRD on hemodialysis Monday Wednesday Friday Positive concurrent left upper extremity graft occlusion  pending IR/vascular surgery intervention Had extra session of dialysis earlier this week per patient  Renal function appears near baseline Appears euvolemic  Consult nephrology Monitor  Essential hypertension Recalcitrant hypertension despite multiple medications Blood pressure 140s to 160s over 100s Continue home regimen including clonidine , Coreg , Norvasc  Titrate as appropriate Follow  SLE (systemic lupus erythematosus related syndrome) (HCC) Baseline discoid SLE Followed by [facility] No longer on thalidomide or plaquenil   Monitor  Chronic HFrEF (heart failure with reduced ejection fraction) (HCC) 2D echo July 2025 with EF of 30 to 35% Appears fairly euvolemic to dry on presentation Noted dialysis dependent volume status though does make a small amount of urine Weight today 68 kg Monitor volume status closely with treatment Follow   Tobacco abuse 1+ blood, smoker daily Discussed cessation at length Monitor      Advance Care Planning:   Code Status: Full Code   Consults: IR, nephrology   Family Communication: No family at the bedside   Severity of Illness: The appropriate patient status for this patient is OBSERVATION. Observation status is judged to be reasonable and necessary in order to provide the required intensity of service to ensure the patient's safety. The patient's presenting symptoms, physical exam findings, and initial radiographic and laboratory data in the context of their medical condition is felt to place them at decreased risk for further clinical deterioration. Furthermore, it is anticipated that the patient will be medically stable for discharge from the hospital within 2 midnights of admission.   Author: Elspeth JINNY Masters, MD 05/19/2024 3:00 PM  For on call review [email or web address].

## 2024-05-19 NOTE — ED Provider Notes (Signed)
 [place] [facility]MENT AT [facility] Provider Note   CSN: [id number] Arrival date & time: 05/19/24  0725     Patient presents with: Upper extremity pain.   Linda [name] is a 28 y.o. female with a history of ESRD on dialysis (MWF), left upper arm AV fistula, HFrEF who presents to the ED for left upper extremity pain.  Patient underwent routine dialysis as scheduled 2 days ago.  During that session she did have some pain in the left upper arm AV fistula insertion site and the session was cut short.  Since then she has had pain and swelling in the left upper extremity.  She attempted to undergo dialysis as scheduled today however dialysis staff was concerned there was decreased thrill in the left upper arm AV fistula along with pain and swelling and sent her here for further evaluation.  Patient also notes 9 pound weight gain over the last 2 days since her last session as well as increased shortness of breath.  Decreased urination as well.  No fevers chills chest pain nausea or vomiting    Arm Pain       Prior to Admission medications   Medication Sig Start Date End Date Taking? Authorizing Provider  acetaminophen  (TYLENOL ) 325 MG tablet Take 2 tablets (650 mg total) by mouth every 6 (six) hours. 03/11/24   Elgergawy, Brayton RAMAN, MD  allopurinol  (ZYLOPRIM ) 100 MG tablet Take 1 tablet (100 mg total) by mouth daily. 03/11/24   Elgergawy, Brayton RAMAN, MD  amLODipine  (NORVASC ) 10 MG tablet Take 2 tablets (20 mg total) by mouth at bedtime. 05/17/24   [name], NP  Blood Pressure KIT Uncontrolled blood pressure 03/11/24   Elgergawy, Brayton RAMAN, MD  Blood Pressure Monitor MISC Use to check blood pressure 03/11/24   Elgergawy, Brayton RAMAN, MD  carvedilol  (COREG ) 25 MG tablet Take 1 tablet (25 mg total) by mouth 2 (two) times daily with a meal. 05/17/24   [name], NP  cloNIDine  (CATAPRES ) 0.1 MG tablet Take 1 tablet (0.1 mg total) by mouth daily. 03/11/24    Elgergawy, Brayton RAMAN, MD  furosemide  (LASIX ) 80 MG tablet Take 1 tablet (80 mg total) by mouth daily. Patient taking differently: Take 160 mg by mouth 2 (two) times daily. 12/23/23   Fairy Frames, MD  irbesartan  (AVAPRO ) 300 MG tablet Take 1 tablet (300 mg total) by mouth daily. 05/17/24   Williamson, Joanna R, NP  lidocaine -prilocaine  (EMLA ) cream Apply 1 Application topically daily as needed (for dialysis). 01/14/24   [provider]  medroxyPROGESTERone  (DEPO-PROVERA ) 150 MG/ML injection Inject 1 mL (150 mg total) into the muscle every 3 (three) months. 10/29/22   Rudy Carlin LABOR, MD  nitroGLYCERIN  (NITROSTAT ) 0.4 MG SL tablet Place 1 tablet (0.4 mg total) under the tongue every 5 (five) minutes as needed for chest pain. 10/28/23   Rai, Ripudeep MARLA, MD  ondansetron  (ZOFRAN ) 4 MG tablet Take 1 tablet (4 mg total) by mouth every 6 (six) hours. 04/06/24   Robinson, John K, PA-C  ondansetron  (ZOFRAN -ODT) 4 MG disintegrating tablet Take 1 tablet (4 mg total) by mouth every 8 (eight) hours as needed for nausea or vomiting. Patient taking differently: Take 4 mg by mouth daily as needed for nausea or vomiting. 10/28/23   Rai, Nydia MARLA, MD  oxyCODONE  (ROXICODONE ) 5 MG immediate release tablet Take 1 tablet (5 mg total) by mouth every 4 (four) hours as needed for severe pain (pain score 7-10). 04/07/24  Yolande Lamar BROCKS, MD  sevelamer  carbonate (RENVELA ) 2.4 g PACK Take 2.4 g by mouth daily. 01/21/24   [provider]  tiZANidine  (ZANAFLEX ) 4 MG tablet Take 1 tablet (4 mg total) by mouth daily as needed for muscle spasms. 05/17/24   [name], NP    Allergies: Iodine , Lisinopril , Gabapentin, Ativan  [lorazepam ], Hydroxychloroquine , and Vicodin [hydrocodone -acetaminophen ]    Review of Systems  Updated Vital Signs BP (!) 163/132   Pulse 75   Temp 98.5 F (36.9 C) (Oral)   Resp 20   SpO2 100%   Physical Exam Vitals and nursing note reviewed.  HENT:     Head: Normocephalic  and atraumatic.  Eyes:     Pupils: Pupils are equal, round, and reactive to light.  Cardiovascular:     Rate and Rhythm: Normal rate and regular rhythm.  Pulmonary:     Effort: Pulmonary effort is normal.     Breath sounds: Normal breath sounds. No rales.  Abdominal:     Palpations: Abdomen is soft.     Tenderness: There is no abdominal tenderness.  Musculoskeletal:     Comments: Left upper arm AV fistula in place Thrill is palpable proximally just distal to axilla No palpable thrill over the right bicep Trace edema left upper extremity asymmetric compared to right upper extremity Full active range of motion left upper extremity  Skin:    General: Skin is warm and dry.  Neurological:     Mental Status: She is alert.  Psychiatric:        Mood and Affect: Mood normal.     (all labs ordered are listed, but only abnormal results are displayed) Labs Reviewed  COMPREHENSIVE METABOLIC PANEL WITH GFR - Abnormal; Notable for the following components:      Result Value   Sodium 129 (*)    Chloride 96 (*)    CO2 18 (*)    BUN 58 (*)    Creatinine, Ser 9.67 (*)    Calcium  8.8 (*)    Albumin  3.0 (*)    GFR, Estimated 5 (*)    All other components within normal limits  CBC WITH DIFFERENTIAL/PLATELET - Abnormal; Notable for the following components:   RBC 3.35 (*)    Hemoglobin 10.1 (*)    HCT 34.2 (*)    MCV 102.1 (*)    MCHC 29.5 (*)    RDW 20.4 (*)    nRBC 0.4 (*)    All other components within normal limits  HCG, SERUM, QUALITATIVE    EKG: EKG Interpretation Date/Time:  Friday May 19 2024 08:17:53 EDT Ventricular Rate:  75 PR Interval:  175 QRS Duration:  95 QT Interval:  422 QTC Calculation: 472 R Axis:   54  Text Interpretation: Sinus rhythm Biatrial enlargement Confirmed by Pamella Sharper [phone]) on 05/19/2024 10:53:35 AM  Radiology: VAS US  DUPLEX DIALYSIS ACCESS (AVF, AVG) Result Date: 05/19/2024 DIALYSIS ACCESS Patient Name:  Linda [name]  Date  of Exam:   05/19/2024 Medical Rec #: [id number]              Accession #:    [id number] Date of Birth: 1995/12/31              Patient Gender: F Patient Age:   43 years Exam Location:  [facility] Procedure:      VAS US  DUPLEX DIALYSIS ACCESS (AVF, AVG) Referring Phys: Callie Bunyard --------------------------------------------------------------------------------  Reason for Exam: No palpable thrill for AVF/AVG. Access Site: Left Upper Extremity.  Access Type: Brachial-Axillary. History: Stent in axilla. Pain during dialysis session on Wednesday after nurse          stuck fistula lower than normal. Patient halted session. Presents with          increased pain and swelling of left upper extremity and diminished          thrill. Limitations: Pain with touch and swelling Comparison Study: No prior study Performing Technologist: Alberta Lis RVS  Examination Guidelines: A complete evaluation includes B-mode imaging, spectral Doppler, color Doppler, and power Doppler as needed of all accessible portions of each vessel. Unilateral testing is considered an integral part of a complete examination. Limited examinations for reoccurring indications may be performed as noted.     Summary: Stent within upper arm appears occluded. Minimal flow noted throughout left upper extremity fistula.     --------------------------------------------------------------------------------   Preliminary    DG Chest Portable 1 View Result Date: 05/19/2024 CLINICAL DATA:  Left upper extremity pain and edema. History of dialysis. EXAM: PORTABLE CHEST 1 VIEW COMPARISON:  May 14, 2024. FINDINGS: Stable cardiomegaly. Both lungs are clear. The visualized skeletal structures are unremarkable. IMPRESSION: No active disease. Electronically Signed   By: Lynwood Landy Raddle M.D.   On: 05/19/2024 08:06     Procedures   Medications Ordered in the ED  ceFAZolin  (ANCEF ) IVPB 2g/100 mL premix (has no administration in time range)  HYDROmorphone   (DILAUDID ) injection 0.5 mg (0.5 mg Intravenous Given 05/19/24 0823)  diphenhydrAMINE  (BENADRYL ) injection 25 mg (25 mg Intravenous Given 05/19/24 0824)  HYDROmorphone  (DILAUDID ) injection 0.5 mg (0.5 mg Intravenous Given 05/19/24 1105)    Clinical Course as of 05/19/24 1429  Fri May 19, 2024  1053 Patient remained stable.  Still reporting some recurrence of left arm pain.  First dose of Dilaudid  helped.  Will give her second dose.  Awaiting ultrasound.  Laboratory workup consistent with known ESRD.  No severe hyperkalemia.  EKG looks okay.  No severe fluid overload on chest x-ray. [MP]  1143 Ultrasound shows occluded left upper arm graft with minimal flow.  She did undergo an AV fistulogram back in July with Dr.Brabham (vascular surgery).  Paged vascular [MP]  1158 Discussed with vascular on-call who recommends reaching out to IR for thrombectomy.  Paged IR [MP]  1204 IR attending is in the middle of a case.  Spoke to [employer] staff.  One of the PAs will review [MP]  1303 IR PA solmon Ku has evaluated patient in the ED.  Plan for tunneled catheter today as schedule allows.  Patient may require additional declotting of occluded graft.  Will admit to medicine [MP]  1406 Discussed with admitting hospitalist who accept patient for admission [MP]    Clinical Course User Index [MP] Pamella Ozell LABOR, DO                                 Medical Decision Making 28 year old female with history as above presenting for issue with left upper extremity AV fistula.  Underwent dialysis 2 days ago pain at the site of insertion at that time and dialysis session was cut short.  Pain and swelling since then.  Was sent here from dialysis center today.  Increased shortness of breath and weight gain since last session.  Concern for AV fistula thrombosis.  Will obtain duplex ultrasound of left upper extremity as well as laboratory workup and chest x-ray.  Will reach  out to her [place] kidney team after ED workup  completed  Amount and/or Complexity of Data Reviewed Labs: ordered. Radiology: ordered.  Risk Prescription drug management. Decision regarding hospitalization.        Final diagnoses:  Occlusion of arteriovenous dialysis graft [facility])  ESRD on dialysis [facility])    ED Discharge Orders     None          Pamella Ozell LABOR, DO 05/19/24 1429

## 2024-05-20 DIAGNOSIS — T82898D Other specified complication of vascular prosthetic devices, implants and grafts, subsequent encounter: Secondary | ICD-10-CM

## 2024-05-20 DIAGNOSIS — T82898S Other specified complication of vascular prosthetic devices, implants and grafts, sequela: Secondary | ICD-10-CM | POA: Diagnosis not present

## 2024-05-20 LAB — COMPREHENSIVE METABOLIC PANEL WITH GFR
ALT: 13 U/L (ref 0–44)
AST: 18 U/L (ref 15–41)
Albumin: 2.9 g/dL — ABNORMAL LOW (ref 3.5–5.0)
Alkaline Phosphatase: 80 U/L (ref 38–126)
Anion gap: 19 — ABNORMAL HIGH (ref 5–15)
BUN: 74 mg/dL — ABNORMAL HIGH (ref 6–20)
CO2: 18 mmol/L — ABNORMAL LOW (ref 22–32)
Calcium: 8.6 mg/dL — ABNORMAL LOW (ref 8.9–10.3)
Chloride: 92 mmol/L — ABNORMAL LOW (ref 98–111)
Creatinine, Ser: 11.31 mg/dL — ABNORMAL HIGH (ref 0.44–1.00)
GFR, Estimated: 4 mL/min — ABNORMAL LOW (ref >60)
Glucose, Bld: 91 mg/dL (ref 70–99)
Potassium: 5.5 mmol/L — ABNORMAL HIGH (ref 3.5–5.1)
Sodium: 129 mmol/L — ABNORMAL LOW (ref 135–145)
Total Bilirubin: 0.9 mg/dL (ref 0.0–1.2)
Total Protein: 7 g/dL (ref 6.5–8.1)

## 2024-05-20 LAB — CBC
HCT: 32.9 % — ABNORMAL LOW (ref 36.0–46.0)
Hemoglobin: 10.1 g/dL — ABNORMAL LOW (ref 12.0–15.0)
MCH: 29.7 pg (ref 26.0–34.0)
MCHC: 30.7 g/dL (ref 30.0–36.0)
MCV: 96.8 fL (ref 80.0–100.0)
Platelets: 182 K/uL (ref 150–400)
RBC: 3.4 MIL/uL — ABNORMAL LOW (ref 3.87–5.11)
RDW: 20 % — ABNORMAL HIGH (ref 11.5–15.5)
WBC: 7.6 K/uL (ref 4.0–10.5)
nRBC: 0.3 % — ABNORMAL HIGH (ref 0.0–0.2)

## 2024-05-20 MED ORDER — HEPARIN SODIUM (PORCINE) 1000 UNIT/ML IJ SOLN
INTRAMUSCULAR | Status: AC
  Filled 2024-05-20: qty 4

## 2024-05-20 MED ORDER — ALTEPLASE 2 MG IJ SOLR: 2.0000 mg | Freq: Once | INTRAMUSCULAR | Status: DC | PRN

## 2024-05-20 MED ORDER — OXYCODONE HCL 5 MG PO TABS
10.0000 mg | ORAL_TABLET | ORAL | Status: DC | PRN
  Administered 2024-05-20 – 2024-05-21 (×3): 10 mg via ORAL
  Filled 2024-05-20 (×3): qty 2

## 2024-05-20 MED ORDER — OXYCODONE HCL 5 MG PO TABS: 5.0000 mg | ORAL_TABLET | ORAL | Status: DC | PRN

## 2024-05-20 MED ORDER — HEPARIN SODIUM (PORCINE) 1000 UNIT/ML DIALYSIS
2500.0000 [IU] | Freq: Once | INTRAMUSCULAR | Status: AC
Start: 2024-05-21 — End: 2024-05-21
  Administered 2024-05-20: 2500 [IU] via INTRAVENOUS_CENTRAL

## 2024-05-20 MED ORDER — MAGIC MOUTHWASH W/LIDOCAINE
5.0000 mL | Freq: Three times a day (TID) | ORAL | Status: DC | PRN
  Administered 2024-05-20 – 2024-05-24 (×13): 5 mL via ORAL
  Filled 2024-05-20 (×17): qty 5

## 2024-05-20 MED ORDER — HYDROMORPHONE HCL 1 MG/ML IJ SOLN
INTRAMUSCULAR | Status: AC
  Filled 2024-05-20: qty 0.5

## 2024-05-20 MED ORDER — HYDROMORPHONE HCL 1 MG/ML IJ SOLN
0.5000 mg | INTRAMUSCULAR | Status: AC | PRN
  Administered 2024-05-20 – 2024-05-21 (×2): 0.5 mg via INTRAVENOUS
  Filled 2024-05-20 (×2): qty 1

## 2024-05-20 MED ORDER — HEPARIN SODIUM (PORCINE) 1000 UNIT/ML DIALYSIS
1000.0000 [IU] | INTRAMUSCULAR | Status: AC | PRN
  Administered 2024-05-20: 1000 [IU] via INTRAVENOUS_CENTRAL

## 2024-05-20 MED ORDER — ACETAMINOPHEN 500 MG PO TABS
1000.0000 mg | ORAL_TABLET | Freq: Three times a day (TID) | ORAL | Status: DC
Start: 2024-05-20 — End: 2024-05-25
  Administered 2024-05-20 – 2024-05-25 (×12): 1000 mg via ORAL
  Filled 2024-05-20 (×13): qty 2

## 2024-05-20 MED ORDER — CLONIDINE HCL 0.1 MG PO TABS
0.1000 mg | ORAL_TABLET | Freq: Every day | ORAL | Status: DC
Start: 2024-05-20 — End: 2024-05-25
  Administered 2024-05-20 – 2024-05-25 (×6): 0.1 mg via ORAL
  Filled 2024-05-20 (×6): qty 1

## 2024-05-20 MED ORDER — ALTEPLASE 2 MG IJ SOLR
INTRAMUSCULAR | Status: AC
  Filled 2024-05-20: qty 4

## 2024-05-20 MED ORDER — IRBESARTAN 300 MG PO TABS
300.0000 mg | ORAL_TABLET | Freq: Every day | ORAL | Status: DC
  Administered 2024-05-20 – 2024-05-25 (×6): 300 mg via ORAL
  Filled 2024-05-20 (×6): qty 1

## 2024-05-20 MED ORDER — HEPARIN SODIUM (PORCINE) 1000 UNIT/ML DIALYSIS
1000.0000 [IU] | INTRAMUSCULAR | Status: DC | PRN
  Administered 2024-05-20: 4200 [IU]

## 2024-05-20 MED ORDER — ALLOPURINOL 100 MG PO TABS
100.0000 mg | ORAL_TABLET | Freq: Every day | ORAL | Status: DC
Start: 2024-05-20 — End: 2024-05-25
  Administered 2024-05-21 – 2024-05-25 (×5): 100 mg via ORAL
  Filled 2024-05-20 (×6): qty 1

## 2024-05-20 NOTE — Procedures (Signed)
 S: pt seen in HD unit, TDC not working great initially, now is up to 299 cc/hr BFR.   Vitals:   05/20/24 1100 05/20/24 1130 05/20/24 1200 05/20/24 1214  BP: (!) 161/125 (!) 171/131 (!) 157/135 (!) 168/127  Pulse: 83 85 89 85  Resp: 17 18 (!) 22 (!) 21  Temp:    98.3 F (36.8 C)  TempSrc:      SpO2: 100% 100% 100% 100%  Weight:      Height:        Recent Labs  Lab 05/19/24 0757 05/20/24 0541  HGB 10.1* 10.1*  ALBUMIN  3.0* 2.9*  CALCIUM  8.8* 8.6*  CREATININE 9.67* 11.31*  K 5.1 5.5*    Inpatient medications:  acetaminophen   1,000 mg Oral TID   allopurinol   100 mg Oral Daily   amLODipine   10 mg Oral QHS   carvedilol   25 mg Oral BID WC   Chlorhexidine  Gluconate Cloth  6 each Topical Q0600   cloNIDine   0.1 mg Oral Daily   furosemide   160 mg Oral BID   heparin   5,000 Units Subcutaneous Q8H   irbesartan   300 mg Oral Daily   sevelamer  carbonate  2.4 g Oral Q breakfast    magic mouthwash w/lidocaine , nitroGLYCERIN , ondansetron  **OR** ondansetron  (ZOFRAN ) IV, oxyCODONE , tiZANidine   I was present at the procedure, reviewed the HD regimen and made appropriate changes.   Myer Fret MD  [facility] 05/20/2024, 9:05 AM

## 2024-05-20 NOTE — Progress Notes (Signed)
  Progress Note   Patient: Linda [name] FMW:[id number] DOB: 02-04-1996 DOA: 05/19/2024     0 DOS: the patient was seen and examined on 05/20/2024        Brief hospital course: 28 y.o. F with ESRD MWF, undifferentiated MCTD, SLE and discoid lupus, G6PD deficiency, HFmrEF and HTN who presented with painful fistula and inability to access fistula.     Assessment and Plan: * Vascular graft occlusion (HCC) Admitted and tunneled catheter placed.  Given hyperalgesia, patient will require GA for declotting, which is planned for next week. - Aggressive analgesia with oxycodone  10 mg and acetamionphen - Consult IR for declot under anesthesia as soon as able    ESRD (end stage renal disease) on dialysis [facility]) Old Transplant notes suggest her biopsy at [facility] in 2019 showed TMA (like hemolytic uremic syndrome) and not c/w lupus nephritis.  - Consult Nephrology for HD  Essential hypertension BP elevated - Resume ARB - Continue clonidine , amlodipine , Coreg , and furosemide   SLE (systemic lupus erythematosus related syndrome) (HCC) MCTD Discoid lupus Not currently on disease modifying treatment.  Follows with [facility]. Ziolkowska.  Chronic pain syndrome No consistent prescriptions in PDMP  Acute on chronic HFrEF (heart failure with reduced ejection fraction) (HCC) Fluid overloaded on admission, underwent HD today. - Volume management per HD  Tobacco abuse Cessation recommended.  Gout - Continue allopurinol   Anemia of ESRD Hgb stable at baseline, no bleeding observed      Subjective: Has pain at graft and from catheter.  Diffuse hyperalgesia.  No dyspnea, no substernal pain. No pitting edema.     Physical Exam: BP (!) 168/127   Pulse 85   Temp 98.3 F (36.8 C)   Resp (!) 21   Ht 5' 6 (1.676 m)   Wt 74.2 kg   SpO2 100%   BMI 26.40 kg/m   General: Pt is alert, awake, not in acute distress, on HD Cardiovascular: RRR, nl S1-S2, no murmurs appreciated.   No  pitting in extremities Respiratory: Normal respiratory rate and rhythm.  CTAB without rales or wheezes. .   Data Reviewed: BMP shows hyponatremia, hyperkalemia, moderate metabolic acidosis, elevated Creatinine, c/w ESRD CBC shows stable anemia     Family Communication:     Disposition: Status is: Observation         Author: Lonni SHAUNNA Dalton, MD 05/20/2024 4:02 PM  For on call review [email or web address].

## 2024-05-20 NOTE — Plan of Care (Signed)

## 2024-05-20 NOTE — Plan of Care (Signed)
   Problem: Pain Managment: Goal: General experience of comfort will improve and/or be controlled Outcome: Not Progressing

## 2024-05-21 DIAGNOSIS — M069 Rheumatoid arthritis, unspecified: Secondary | ICD-10-CM | POA: Diagnosis present

## 2024-05-21 DIAGNOSIS — T82898S Other specified complication of vascular prosthetic devices, implants and grafts, sequela: Secondary | ICD-10-CM | POA: Diagnosis not present

## 2024-05-21 DIAGNOSIS — I5022 Chronic systolic (congestive) heart failure: Secondary | ICD-10-CM | POA: Diagnosis not present

## 2024-05-21 DIAGNOSIS — F17290 Nicotine dependence, other tobacco product, uncomplicated: Secondary | ICD-10-CM | POA: Diagnosis present

## 2024-05-21 DIAGNOSIS — F17210 Nicotine dependence, cigarettes, uncomplicated: Secondary | ICD-10-CM | POA: Diagnosis present

## 2024-05-21 DIAGNOSIS — T82868A Thrombosis of vascular prosthetic devices, implants and grafts, initial encounter: Secondary | ICD-10-CM | POA: Diagnosis present

## 2024-05-21 DIAGNOSIS — E871 Hypo-osmolality and hyponatremia: Secondary | ICD-10-CM | POA: Diagnosis present

## 2024-05-21 DIAGNOSIS — I10 Essential (primary) hypertension: Secondary | ICD-10-CM | POA: Diagnosis not present

## 2024-05-21 DIAGNOSIS — E875 Hyperkalemia: Secondary | ICD-10-CM | POA: Diagnosis present

## 2024-05-21 DIAGNOSIS — T82898A Other specified complication of vascular prosthetic devices, implants and grafts, initial encounter: Secondary | ICD-10-CM | POA: Diagnosis not present

## 2024-05-21 DIAGNOSIS — I5042 Chronic combined systolic (congestive) and diastolic (congestive) heart failure: Secondary | ICD-10-CM | POA: Diagnosis not present

## 2024-05-21 DIAGNOSIS — N186 End stage renal disease: Secondary | ICD-10-CM | POA: Diagnosis not present

## 2024-05-21 DIAGNOSIS — D631 Anemia in chronic kidney disease: Secondary | ICD-10-CM | POA: Diagnosis not present

## 2024-05-21 DIAGNOSIS — I82C11 Acute embolism and thrombosis of right internal jugular vein: Secondary | ICD-10-CM | POA: Diagnosis present

## 2024-05-21 DIAGNOSIS — Z765 Malingerer [conscious simulation]: Secondary | ICD-10-CM | POA: Diagnosis not present

## 2024-05-21 DIAGNOSIS — E872 Acidosis, unspecified: Secondary | ICD-10-CM | POA: Diagnosis present

## 2024-05-21 DIAGNOSIS — G43909 Migraine, unspecified, not intractable, without status migrainosus: Secondary | ICD-10-CM | POA: Diagnosis present

## 2024-05-21 DIAGNOSIS — M351 Other overlap syndromes: Secondary | ICD-10-CM | POA: Diagnosis present

## 2024-05-21 DIAGNOSIS — R635 Abnormal weight gain: Secondary | ICD-10-CM | POA: Diagnosis present

## 2024-05-21 DIAGNOSIS — E8779 Other fluid overload: Secondary | ICD-10-CM | POA: Diagnosis not present

## 2024-05-21 DIAGNOSIS — I1A0 Resistant hypertension: Secondary | ICD-10-CM | POA: Diagnosis present

## 2024-05-21 DIAGNOSIS — G894 Chronic pain syndrome: Secondary | ICD-10-CM | POA: Diagnosis present

## 2024-05-21 DIAGNOSIS — Z992 Dependence on renal dialysis: Secondary | ICD-10-CM | POA: Diagnosis not present

## 2024-05-21 DIAGNOSIS — I742 Embolism and thrombosis of arteries of the upper extremities: Secondary | ICD-10-CM | POA: Diagnosis not present

## 2024-05-21 DIAGNOSIS — I749 Embolism and thrombosis of unspecified artery: Secondary | ICD-10-CM | POA: Diagnosis not present

## 2024-05-21 DIAGNOSIS — M328 Other forms of systemic lupus erythematosus: Secondary | ICD-10-CM | POA: Diagnosis present

## 2024-05-21 DIAGNOSIS — T82898D Other specified complication of vascular prosthetic devices, implants and grafts, subsequent encounter: Secondary | ICD-10-CM | POA: Diagnosis not present

## 2024-05-21 DIAGNOSIS — T85868A Thrombosis due to other internal prosthetic devices, implants and grafts, initial encounter: Secondary | ICD-10-CM | POA: Diagnosis not present

## 2024-05-21 DIAGNOSIS — M109 Gout, unspecified: Secondary | ICD-10-CM | POA: Diagnosis present

## 2024-05-21 DIAGNOSIS — Y718 Miscellaneous cardiovascular devices associated with adverse incidents, not elsewhere classified: Secondary | ICD-10-CM | POA: Diagnosis present

## 2024-05-21 DIAGNOSIS — K219 Gastro-esophageal reflux disease without esophagitis: Secondary | ICD-10-CM | POA: Diagnosis present

## 2024-05-21 DIAGNOSIS — I120 Hypertensive chronic kidney disease with stage 5 chronic kidney disease or end stage renal disease: Secondary | ICD-10-CM | POA: Diagnosis not present

## 2024-05-21 DIAGNOSIS — E1122 Type 2 diabetes mellitus with diabetic chronic kidney disease: Secondary | ICD-10-CM | POA: Diagnosis not present

## 2024-05-21 DIAGNOSIS — Y832 Surgical operation with anastomosis, bypass or graft as the cause of abnormal reaction of the patient, or of later complication, without mention of misadventure at the time of the procedure: Secondary | ICD-10-CM | POA: Diagnosis present

## 2024-05-21 DIAGNOSIS — I132 Hypertensive heart and chronic kidney disease with heart failure and with stage 5 chronic kidney disease, or end stage renal disease: Secondary | ICD-10-CM | POA: Diagnosis not present

## 2024-05-21 DIAGNOSIS — I5023 Acute on chronic systolic (congestive) heart failure: Secondary | ICD-10-CM | POA: Diagnosis present

## 2024-05-21 DIAGNOSIS — D75A Glucose-6-phosphate dehydrogenase (G6PD) deficiency without anemia: Secondary | ICD-10-CM | POA: Diagnosis present

## 2024-05-21 DIAGNOSIS — T82848A Pain from vascular prosthetic devices, implants and grafts, initial encounter: Secondary | ICD-10-CM | POA: Diagnosis present

## 2024-05-21 LAB — BASIC METABOLIC PANEL WITH GFR
Anion gap: 13 (ref 5–15)
BUN: 49 mg/dL — ABNORMAL HIGH (ref 6–20)
CO2: 22 mmol/L (ref 22–32)
Calcium: 8.9 mg/dL (ref 8.9–10.3)
Chloride: 96 mmol/L — ABNORMAL LOW (ref 98–111)
Creatinine, Ser: 9.24 mg/dL — ABNORMAL HIGH (ref 0.44–1.00)
GFR, Estimated: 5 mL/min — ABNORMAL LOW (ref >60)
Glucose, Bld: 84 mg/dL (ref 70–99)
Potassium: 4.6 mmol/L (ref 3.5–5.1)
Sodium: 131 mmol/L — ABNORMAL LOW (ref 135–145)

## 2024-05-21 LAB — CBC
HCT: 32.4 % — ABNORMAL LOW (ref 36.0–46.0)
Hemoglobin: 10.1 g/dL — ABNORMAL LOW (ref 12.0–15.0)
MCH: 30.1 pg (ref 26.0–34.0)
MCHC: 31.2 g/dL (ref 30.0–36.0)
MCV: 96.7 fL (ref 80.0–100.0)
Platelets: 157 K/uL (ref 150–400)
RBC: 3.35 MIL/uL — ABNORMAL LOW (ref 3.87–5.11)
RDW: 20.3 % — ABNORMAL HIGH (ref 11.5–15.5)
WBC: 6.3 K/uL (ref 4.0–10.5)
nRBC: 0 % (ref 0.0–0.2)

## 2024-05-21 MED ORDER — OXYCODONE HCL 5 MG PO TABS
15.0000 mg | ORAL_TABLET | Freq: Four times a day (QID) | ORAL | Status: DC | PRN
  Administered 2024-05-21 – 2024-05-22 (×2): 15 mg via ORAL
  Filled 2024-05-21 (×2): qty 3

## 2024-05-21 NOTE — Hospital Course (Addendum)
 CC: left arm pain at fistula site HPI: Linda [name] is a 28 y.o. female with medical history significant of SLE, ESRD on hemodialysis, hypertension, HFrEF, rheumatoid arthritis presenting with vascular graft occlusion.  Patient with noted baseline history of ESRD on hemodialysis Monday Wednesday Friday.  Noted recent admission 02/2024 for issues including volume overload and respiratory failure requiring intubation. Patient states that she is compliant with HD. Patient states she is going on a regular course of time this week including an extra session on Tuesday for concern for volume overload.  Reports a roughly 20 pound weight gain within the past week or so that is well above baseline.  Patient states she noticed some mild left upper extremity pain redness and swelling over the past 3 days.  Patient went for dialysis today and facility was unable to draw blood from the graft area.  Reports worsening redness and swelling over the affected area.  Patient denies any prior episodes like this in the past.  Denies any recent trauma to the affected area.  Denies any prior episodes like this in the past.  Does still make a trace amount of urine on a regular basis in the setting of ESRD.  Reports compliance overall with hemodialysis as well as medication regimen.  Was unable to obtain dialysis today secondary to graft occlusion.  Denies any prior episodes like this in the past.  Smokes 1 black a mile per day.  Is followed by [facility] health rheumatology. Presented to the ER afebrile, blood pressures 140s to 160s over 100s.  Satting well on room air.  White count 8.4, hemoglobin 10.1, platelets 173, creatinine 9.67, platelets 258.  Bicarb 18.  Chest x-ray stable.  Vascular ultrasound duplex of dialysis catheter showing positive occlusion.  Interventional radiology consulted for further evaluation.  Significant Events: Admitted 05/19/2024 for occluded AVF   Admission Labs: Na 129, K 5.1, CO2 of  18, BUN 58, Scr 9.67, glu 72 WBC 8.4, HgB 10.1, plt 173 Mg 2.2  Admission Imaging Studies: CXR No active disease.   Significant Labs:   Significant Imaging Studies:   Antibiotic Therapy: Anti-infectives (From admission, onward)    Start     Dose/Rate Route Frequency Ordered Stop   05/19/24 1600  ceFAZolin  (ANCEF ) IVPB 2g/100 mL premix        over 30 Minutes Intravenous Continuous PRN 05/19/24 1607 05/19/24 1600   05/19/24 1400  ceFAZolin  (ANCEF ) IVPB 2g/100 mL premix  Status:  Discontinued        2 g 200 mL/hr over 30 Minutes Intravenous On call 05/19/24 1356 05/20/24 1400       Procedures: 05-19-2024 temporary dialysis catheter placement 09-23-205 left AVF declot, removal of temporary dialysis catheter  Consultants:

## 2024-05-21 NOTE — Plan of Care (Signed)

## 2024-05-21 NOTE — Progress Notes (Signed)
  Progress Note   Patient: Linda [name] FMW:[id number] DOB: [birth year] DOA: 05/19/2024     0 DOS: the patient was seen and examined on 05/21/2024        Brief hospital course: 28 y.o. F with ESRD MWF, undifferentiated MCTD, SLE and discoid lupus, G6PD deficiency, HFmrEF and HTN who presented with painful fistula and inability to access fistula.   Admitted and TDC placed, given her hyperalgesia and previous experience, IR plan for declot under anesthesia, which will have to wait for early this week     Assessment and Plan: * Vascular graft occlusion (HCC) See note from 9/20 - Plan for IR declot under anesthesia 9/23 -Aggressive analgesia  ESRD (end stage renal disease) on dialysis [facility]) -Appreciate nephrology assistance - All BMD therapies per nephrology  Essential hypertension Blood pressure close to controlled - Continue ARB, clonidine , amlodipine , Coreg , furosemide   SLE (systemic lupus erythematosus related syndrome) (HCC) MCTD Discoid lupus Not currently on disease specific therapy  Chronic pain syndrome No consistent prescriptions in PDMP   Acute on chronic HFrEF (heart failure with reduced ejection fraction) (HCC) Fluid overloaded on admission, underwent HD today. - Volume management per HD   Tobacco abuse Cessation recommended.   Gout - Continue allopurinol    Anemia of ESRD Hgb stable at baseline, no bleeding observed          Subjective: Feeling okay, the left arm feels swollen, no fever, no confusion.  Appetite okay, and no respiratory symptoms     Physical Exam: BP (!) 142/112 (BP Location: Left Arm)   Pulse 72   Temp 97.6 F (36.4 C)   Resp 17   Ht 5' 6 (1.676 m)   Wt 74.2 kg   SpO2 99%   BMI 26.40 kg/m   Adult female, lying in bed, interactive and appropriate RRR, no murmurs, no peripheral edema, the left arm is slightly more swollen relative to the right, but no pitting, and movement of the left arm appears  normal Respiratory rate normal, lungs clear without rales or wheezes Attention normal, affect normal, judgment and insight appear normal    Data Reviewed: Basic metabolic panel shows mild hyponatremia, renal function abnormal consistent with ESRD CBC shows stable anemia    Family Communication:     Disposition: Status is: Observation         Author: Lonni SHAUNNA Dalton, MD 05/21/2024 1:25 PM  For on call review [email or web address].

## 2024-05-21 NOTE — Progress Notes (Signed)
  [facility] Progress Note   Subjective: Seen in room. Still feeling rough TDC in place. Completed dialysis yesterday with net UF 2.6L. Plan for declot of AVG next week.   Objective Vitals:   05/20/24 1711 05/20/24 2052 05/21/24 0536 05/21/24 0753  BP: (!) 168/121 (!) 158/117 (!) 164/129 (!) 159/124  Pulse: 88 86 72 90  Resp: 18  18 16   Temp:  98.1 F (36.7 C) 97.9 F (36.6 C) 97.9 F (36.6 C)  TempSrc:   Oral Oral  SpO2: 100% 100% 100% 100%  Weight:      Height:        Additional Objective Labs: Basic Metabolic Panel: Recent Labs  Lab 05/19/24 0757 05/20/24 0541 05/21/24 0545  NA 129* 129* 131*  K 5.1 5.5* 4.6  CL 96* 92* 96*  CO2 18* 18* 22  GLUCOSE 72 91 84  BUN 58* 74* 49*  CREATININE 9.67* 11.31* 9.24*  CALCIUM  8.8* 8.6* 8.9   CBC: Recent Labs  Lab 05/19/24 0757 05/20/24 0541 05/21/24 0545  WBC 8.4 7.6 6.3  NEUTROABS 5.0  --   --   HGB 10.1* 10.1* 10.1*  HCT 34.2* 32.9* 32.4*  MCV 102.1* 96.8 96.7  PLT 173 182 157   Blood Culture    Component Value Date/Time   SDES BLOOD RIGHT HAND 04/07/2024 1215   SPECREQUEST  04/07/2024 1215    BOTTLES DRAWN AEROBIC ONLY Blood Culture results may not be optimal due to an inadequate volume of blood received in culture bottles   CULT  04/07/2024 1215    NO GROWTH 5 DAYS Performed at [facility], [address], [place], [place] [address]    REPTSTATUS 04/12/2024 FINAL 04/07/2024 1215     Physical Exam General: Alert, nad, on nasal oxygen Heart: RRR Lungs: Coarse breath sounds Abdomen: non-tender Extremities: 1+ LE edema  Dialysis Access: TDC   Medications:   acetaminophen   1,000 mg Oral TID   allopurinol   100 mg Oral Daily   amLODipine   10 mg Oral QHS   carvedilol   25 mg Oral BID WC   Chlorhexidine  Gluconate Cloth  6 each Topical Q0600   cloNIDine   0.1 mg Oral Daily   furosemide   160 mg Oral BID   heparin   5,000 Units Subcutaneous Q8H   irbesartan   300 mg Oral Daily    sevelamer  carbonate  2.4 g Oral Q breakfast   Home bp meds: Norvasc  20mg  hs Coreg  25 bid Clonidine  0.1mg  every day Lasix  80mg  every day Irbesartan  300 mg every day  Dialysis Orders: SW MWF 3.5h   B400   68.4kg   2K bath   AVG   Heparin  3000 Gets to dry wt about every other HD, o/w is 1- 4kg over Had extra HD 9/16 Tuesday for vol excess Last HD 9/17 was only 30 min long, left 70.6kg  Last Hb 11.3 on 9/17, not on esa's  Assessment/Plan: Clotted AV graft:  IR consulted ---and placed RIJ TDC. Plans for AVG declot next week -likely under GA   ESRD: on HD MWF.  Continue on schedule - Next HD 9/22.  Resistant HTN: Diastolic HTN on multiple BP medications, continue. Has outpt appt w/ HTN specialist coming up.  Volume: CXR negative for pulm edema. Over dry weight and has signs of volume excess. Continue to push UF as able. Follow weights. Wean off O2 as tolerated. Post wt standing wt in kg was 74.2kg (6kg over) Anemia: Hgb 10.  follow. Not getting esa at  op unit.   2HPTH: Ca acceptable. Continue home meds.  HFrEF 30-35%. Optimize volume with HD.  H/o  SLE/MCTD  Linda Ronnald Acosta PA-C [facility] 05/21/2024,10:36 AM

## 2024-05-22 DIAGNOSIS — T82898S Other specified complication of vascular prosthetic devices, implants and grafts, sequela: Secondary | ICD-10-CM | POA: Diagnosis not present

## 2024-05-22 LAB — RENAL FUNCTION PANEL
Albumin: 2.7 g/dL — ABNORMAL LOW (ref 3.5–5.0)
Anion gap: 16 — ABNORMAL HIGH (ref 5–15)
BUN: 67 mg/dL — ABNORMAL HIGH (ref 6–20)
CO2: 21 mmol/L — ABNORMAL LOW (ref 22–32)
Calcium: 8.9 mg/dL (ref 8.9–10.3)
Chloride: 94 mmol/L — ABNORMAL LOW (ref 98–111)
Creatinine, Ser: 11.15 mg/dL — ABNORMAL HIGH (ref 0.44–1.00)
GFR, Estimated: 4 mL/min — ABNORMAL LOW (ref >60)
Glucose, Bld: 85 mg/dL (ref 70–99)
Phosphorus: 8 mg/dL — ABNORMAL HIGH (ref 2.5–4.6)
Potassium: 4.7 mmol/L (ref 3.5–5.1)
Sodium: 131 mmol/L — ABNORMAL LOW (ref 135–145)

## 2024-05-22 MED ORDER — LIDOCAINE-PRILOCAINE 2.5-2.5 % EX CREA: 1.0000 | TOPICAL_CREAM | CUTANEOUS | Status: DC | PRN

## 2024-05-22 MED ORDER — HYDROMORPHONE HCL 2 MG PO TABS
3.0000 mg | ORAL_TABLET | ORAL | Status: DC | PRN
  Administered 2024-05-22 – 2024-05-23 (×4): 3 mg via ORAL
  Filled 2024-05-22 (×4): qty 2

## 2024-05-22 MED ORDER — PREDNISONE 20 MG PO TABS
50.0000 mg | ORAL_TABLET | Freq: Four times a day (QID) | ORAL | Status: DC
  Administered 2024-05-22: 50 mg via ORAL
  Filled 2024-05-22: qty 1

## 2024-05-22 MED ORDER — LIDOCAINE HCL (PF) 1 % IJ SOLN: 5.0000 mL | INTRAMUSCULAR | Status: DC | PRN

## 2024-05-22 MED ORDER — PENTAFLUOROPROP-TETRAFLUOROETH EX AERO: 1.0000 | INHALATION_SPRAY | CUTANEOUS | Status: DC | PRN

## 2024-05-22 MED ORDER — PREDNISONE 20 MG PO TABS
50.0000 mg | ORAL_TABLET | Freq: Four times a day (QID) | ORAL | Status: AC
  Administered 2024-05-23 (×2): 50 mg via ORAL
  Filled 2024-05-22 (×2): qty 1

## 2024-05-22 MED ORDER — ANTICOAGULANT SODIUM CITRATE 4% (200MG/5ML) IV SOLN: 5.0000 mL | Status: DC | PRN

## 2024-05-22 MED ORDER — HEPARIN SODIUM (PORCINE) 1000 UNIT/ML IJ SOLN
INTRAMUSCULAR | Status: AC
Start: 2024-05-22 — End: 2024-05-22
  Filled 2024-05-22: qty 5

## 2024-05-22 MED ORDER — HEPARIN SODIUM (PORCINE) 1000 UNIT/ML DIALYSIS
3000.0000 [IU] | INTRAMUSCULAR | Status: DC | PRN
  Administered 2024-05-22: 3000 [IU] via INTRAVENOUS_CENTRAL

## 2024-05-22 MED ORDER — DIPHENHYDRAMINE HCL 25 MG PO CAPS: 50.0000 mg | ORAL_CAPSULE | Freq: Once | ORAL | Status: DC

## 2024-05-22 MED ORDER — ALTEPLASE 2 MG IJ SOLR: 2.0000 mg | Freq: Once | INTRAMUSCULAR | Status: DC | PRN

## 2024-05-22 MED ORDER — HEPARIN SODIUM (PORCINE) 1000 UNIT/ML IJ SOLN
INTRAMUSCULAR | Status: AC
Start: 2024-05-22 — End: 2024-05-22
  Filled 2024-05-22: qty 3

## 2024-05-22 MED ORDER — HEPARIN SODIUM (PORCINE) 1000 UNIT/ML DIALYSIS
1000.0000 [IU] | INTRAMUSCULAR | Status: DC | PRN
  Administered 2024-05-22: 3200 [IU]

## 2024-05-22 MED ORDER — STERILE WATER FOR INJECTION IJ SOLN
INTRAMUSCULAR | Status: AC
  Filled 2024-05-22: qty 10

## 2024-05-22 MED ORDER — HYDROMORPHONE HCL 1 MG/ML IJ SOLN
0.5000 mg | INTRAMUSCULAR | Status: AC | PRN
  Administered 2024-05-22 – 2024-05-23 (×2): 0.5 mg via INTRAVENOUS
  Filled 2024-05-22 (×2): qty 1

## 2024-05-22 MED ORDER — ALTEPLASE 2 MG IJ SOLR
INTRAMUSCULAR | Status: AC
  Filled 2024-05-22: qty 4

## 2024-05-22 MED ORDER — DOXAZOSIN MESYLATE 2 MG PO TABS
2.0000 mg | ORAL_TABLET | Freq: Every day | ORAL | Status: DC
  Administered 2024-05-22 – 2024-05-25 (×3): 2 mg via ORAL
  Filled 2024-05-22 (×5): qty 1

## 2024-05-22 NOTE — Anesthesia Preprocedure Evaluation (Addendum)
 Anesthesia Evaluation  Patient identified by MRN, date of birth, ID band Patient awake    Reviewed: Allergy & Precautions, NPO status , Patient's Chart, lab work & pertinent test results  History of Anesthesia Complications Negative for: history of anesthetic complications  Airway Mallampati: I       Dental  (+) Dental Advisory Given   Pulmonary neg recent URI, Current Smoker and Patient abstained from smoking.   breath sounds clear to auscultation       Cardiovascular hypertension,  Rhythm:Regular Rate:Normal  TTE 2025: 1. Left ventricular ejection fraction, by estimation, is 30 to 35%. The  left ventricle has moderately decreased function. The left ventricle  demonstrates global hypokinesis. The left ventricular internal cavity size  was moderately dilated. There is mild   left ventricular hypertrophy. Left ventricular diastolic parameters are  indeterminate.   2. Right ventricular systolic function is normal. The right ventricular  size is normal.   3. Left atrial size was moderately dilated.   4. Right atrial size was moderately dilated.   5. The mitral valve is abnormal. Mild mitral valve regurgitation. No  evidence of mitral stenosis.   6. Tricuspid valve regurgitation is moderate.   7. The aortic valve is normal in structure. Aortic valve regurgitation is  not visualized. No aortic stenosis is present.   8. The inferior vena cava is normal in size with greater than 50%  respiratory variability, suggesting right atrial pressure of 3 mmHg.     Neuro/Psych  Headaches    GI/Hepatic ,GERD  ,,  Endo/Other  diabetes, Type 2    Renal/GU ESRF and DialysisRenal disease     Musculoskeletal   Abdominal   Peds  Hematology  (+) Blood dyscrasia, anemia   Anesthesia Other Findings SLE, Complex Pain Syndrome  Reproductive/Obstetrics                              Anesthesia  Physical Anesthesia Plan  ASA: 3  Anesthesia Plan: General   Post-op Pain Management:    Induction: Intravenous  PONV Risk Score and Plan:   Airway Management Planned: Oral ETT  Additional Equipment: Arterial line and ClearSight  Intra-op Plan:   Post-operative Plan:   Informed Consent:      Dental advisory given  Plan Discussed with:   Anesthesia Plan Comments:          Anesthesia Quick Evaluation

## 2024-05-22 NOTE — TOC Initial Note (Signed)
 Transition of Care [facility]) - Initial/Assessment Note    Patient Details  Name: Linda [name] MRN: [id number] Date of Birth: October 18, 1995  Transition of Care [facility]) CM/SW Contact:    Nola Devere Hands, RN Phone Number: 05/22/2024, 1:35 PM  Clinical Narrative:                  Patient is a 28 y.o. F with ESRD MWF, who presented with painful fistula and inability to access fistula. Patient has Thombosed left upper arm AVG. Plan is for Surgical Thrombectomy on 05/23/24. ICM will follow.       Patient Goals and CMS Choice            Expected Discharge Plan and Services                                              Prior Living Arrangements/Services                       Activities of Daily Living   ADL Screening (condition at time of admission) Independently performs ADLs?: Yes (appropriate for developmental age) Is the patient deaf or have difficulty hearing?: No Does the patient have difficulty seeing, even when wearing glasses/contacts?: No Does the patient have difficulty concentrating, remembering, or making decisions?: No  Permission Sought/Granted                  Emotional Assessment              Admission diagnosis:  ESRD on dialysis (HCC) [N18.6, Z99.2] Vascular graft occlusion (HCC) [T82.898A] Occlusion of arteriovenous dialysis graft (HCC) [T82.898A] Patient Active Problem List   Diagnosis Date Noted   Vascular graft occlusion (HCC) 05/19/2024   Chronic HFrEF (heart failure with reduced ejection fraction) (HCC) 05/19/2024   Aspiration pneumonia (HCC) 03/07/2024   HFrEF (heart failure with reduced ejection fraction) (HCC) 03/07/2024   Acute hypoxemic respiratory failure (HCC) 03/06/2024   Renal failure 02/09/2024   Hypoglycemia 02/08/2024   Anemia of chronic disease 02/08/2024   Fluid overload 01/12/2024   Abscess of buttock, right 01/11/2024   Hyperkalemia 12/22/2023   Positive D dimer 12/22/2023   Skin lesions  11/16/2023   Chronic health problem 11/14/2023   Abdominal pain 11/14/2023   Dyspnea 11/14/2023   SOB (shortness of breath) 10/24/2023   Pericardial effusion 10/24/2023   Lupus nephritis (HCC) 10/24/2023   ESRD (end stage renal disease) on dialysis (HCC) 10/24/2023   Chronic heart failure with preserved ejection fraction (HCC) 10/24/2023   Acute hypoxic respiratory failure (HCC) 10/10/2023   CAP (community acquired pneumonia) 10/10/2023   Acute on chronic heart failure with mildly reduced ejection fraction (HFmrEF, 41-49%) (HCC) 10/10/2023   History of anemia due to chronic kidney disease 10/10/2023   Pulmonary edema 09/11/2023   Shortness of breath 09/11/2023   Pain in multiple muscles 09/11/2023   Nausea vomiting and diarrhea 09/01/2023   ESRD (end stage renal disease) (HCC) 09/30/2021   Acute pulmonary edema (HCC) 09/30/2021   GERD without esophagitis 09/30/2021   Volume overload 09/02/2021   High anion gap metabolic acidosis 09/02/2021   Macrocytic anemia 09/02/2021   Elevated troponin 09/02/2021   Anxiety 11/03/2019   Strain of sternocleidomastoid muscle 11/03/2019   Difficulty with speech 11/03/2019   Prolonged QT interval 03/02/2019   Chronic heart failure with preserved ejection fraction (HFpEF) (  HCC) 03/02/2019   ESRD on dialysis (HCC) 01/10/2019   Calciphylaxis 12/27/2018   Tobacco abuse 03/02/2018   Severe sepsis (HCC) 01/06/2018   Angioedema 01/02/2018   Tachycardia 01/02/2018   Chronic kidney disease, stage 5 (HCC)    Hypertensive urgency 11/29/2017   Grief reaction 08/19/2017   Right knee pain    Bilateral knee swelling 04/20/2017   SLE (systemic lupus erythematosus related syndrome) (HCC)    Essential hypertension 12/11/2016   Mixed connective tissue disease 02/11/2016   Implanon  in place 01/19/2014   PCP:  [name], NP Pharmacy:   [id number] - [place], [place] - [address] [place] [address] Phone:  [phone] Fax: [phone]  [facility] [address] [place] [address] Phone: [phone] Fax: [phone]     Social Drivers of Health (SDOH) Social History: SDOH Screenings   Food Insecurity: No Food Insecurity (05/19/2024)  Housing: Low Risk  (05/19/2024)  Transportation Needs: No Transportation Needs (05/19/2024)  Utilities: Not At Risk (05/19/2024)  Alcohol Screen: Low Risk  (02/03/2024)  Depression (PHQ2-9): Low Risk  (02/03/2024)  Recent Concern: Depression (PHQ2-9) - Medium Risk (01/26/2024)  Financial Resource Strain: Low Risk  (02/03/2024)  Physical Activity: Insufficiently Active (02/03/2024)  Social Connections: Socially Isolated (02/03/2024)  Stress: No Stress Concern Present (02/03/2024)  Tobacco Use: High Risk (05/19/2024)  Health Literacy: Adequate Health Literacy (02/03/2024)   SDOH Interventions:     Readmission Risk Interventions    11/15/2023    2:53 PM 10/25/2023    3:32 PM 10/02/2021    9:11 AM  Readmission Risk Prevention Plan  Transportation Screening Complete Complete   PCP or Specialist Appt within 3-5 Days   Complete  HRI or Home Care Consult  Complete Complete  Social Work Consult for Recovery Care Planning/Counseling  Complete   Palliative Care Screening  Complete   Medication Review [facility]) Referral to Pharmacy Referral to Pharmacy   PCP or Specialist appointment within 3-5 days of discharge Complete    HRI or Home Care Consult Complete    SW Recovery Care/Counseling Consult Complete    Palliative Care Screening Not Applicable    Skilled Nursing Facility Not Applicable

## 2024-05-22 NOTE — Progress Notes (Signed)
 Received patient in bed.Alert and oriented x 4. Consent verified.  Access used:Right hd catheter that did not worked well. Tried to initiate treatment twice,post TPA both catheter limb did not worked well. Venous access has had no blood back flow.,Arterial catheter limb has had not consistent back blood flow/sluggish.  Hand off to the patient,back into her room with stable consdition via transporter.

## 2024-05-22 NOTE — Progress Notes (Signed)
 Interventional Radiology Brief Note:  Linda [name] is a 28 y.o. female with GERD, HTN, lupus with end stage renal disease on HD via LUE graft who presented with occluded LUE graft, confirmed by vas US  9/19. Given complex medical history including difficulty with HD, need for adequate pain management during procedure, and possible contrast allergy IR consulted for declot prior to discharge.   Per procedure note from most recent encounter with IR 05/18/2022, patient would likely need general anesthesia for future interventions.  Patient also indicates difficult pain management with  moderate sedation alone.  Fortunately, she was able to tolerate successful placement of a tunneled HD catheter 9/19.  Unfortunately, this line has been malfunctioning and she was unable to undergo her anticipated session today.   Patient is scheduled for declot of her LUE AVG with Dr. Jennefer 9/23.  Anesthesia booking confirmed and planned for 2pm.  She will be given contrast allergy premedication out of an abdunance of caution for documented allergy to iodine  without patient recollection of event/severity.   She is NPO p MN.   Linda Vien, MS RD PA-C

## 2024-05-22 NOTE — Progress Notes (Signed)
  Progress Note   Patient: Linda [name] FMW:[id number] DOB: 05-23-1996 DOA: 05/19/2024     1 DOS: the patient was seen and examined on 05/22/2024        Brief hospital course: 28 y.o. F with ESRD MWF, undifferentiated MCTD, SLE and discoid lupus, G6PD deficiency, HFmrEF and HTN who presented with painful fistula and inability to access fistula.   Admitted and US  showed LUE stent appeared occluded and minimal flow through the LUE fistula.  TDC placed.  Immediate declot and dishcarge were not possible given her hyperalgesia and previous experience.  Therefore, IR plan for declot under anesthesia, which will have to wait for early this week     Assessment and Plan: * Vascular graft occlusion (HCC) See note from 9/20.  See previous notes, we have aggressively titrated up her analgesic regimen. The arm appears benign, her tunneled dialysis catheter appears benign - IR thrombectomy under anesthesia 9/23  ESRD (end stage renal disease) on dialysis [facility]) -Nephrology consulted for routine HD - Sevelamer   Essential hypertension Blood pressure elevated - Continue amlodipine , carvedilol , clonidine , furosemide , irbesartan  - Nephrology have added doxazosin   SLE (systemic lupus erythematosus related syndrome) (HCC) MCTD Discoid lupus Not on disease-modifying therapy  Chronic pain syndrome Given the experience of hyperalgesia here, any consideration of daily opiate therapy as an outpaitent needs to seriously weigh how this would exacerbate her hyperalgesia.  Acute on chronic chronic HFrEF (heart failure with reduced ejection fraction) (HCC) -Volume management by HD - Continue carvedilol , Lasix , irbesartan   Tobacco abuse Cessation recommended  Gout No evidence of flare -Continue allopurinol   Anemia of chronic kidney disease Hemoglobin stable relative to baseline       Subjective: Crying, but unable to articulate what is making her cry.  I think it is pain at her left  arm and dialysis catheter, no respiratory symptoms, no fever, no reported nursing concerns.     Physical Exam: BP (!) 152/113   Pulse 76   Temp 97.9 F (36.6 C)   Resp 19   Ht 5' 6 (1.676 m)   Wt 80.9 kg   SpO2 100%   BMI 28.79 kg/m   Adult female, on dialysis machine, tearful RRR, no murmurs, no peripheral pitting Respiratory rate seems normal, respirations very shallow, lung sounds diminished, no rales or wheezes appreciated Abdomen soft, no tenderness palpation or guarding She does not let me touch around her tunneled dialysis catheter or upper arm, but their appearance is benign Attention diminished due to crying, face symmetric, speech fluent, moves right upper extremity with normal strength, guarding left upper extremity lower extremities not tested    Data Reviewed: Basic metabolic panel today shows hyponatremia, unchanged, elevated creatinine    Family Communication:     Disposition: Status is: Inpatient         Author: Lonni SHAUNNA Dalton, MD 05/22/2024 10:45 AM  For on call review [email or web address].

## 2024-05-22 NOTE — Plan of Care (Signed)

## 2024-05-22 NOTE — Progress Notes (Signed)
 Pt receives out-pt HD at [facility] on MWF 7:10 am chair time. Will assist as needed.   Randine Mungo Dialysis Navigator [phone]

## 2024-05-22 NOTE — Progress Notes (Signed)
 Patient ID: Linda [name], female   DOB: 1996-02-05, 28 y.o.   MRN: [id number] [facility] Progress Note   Assessment/ Plan:   1.  Thrombosed left upper arm AV graft: Plans in place for surgical thrombectomy on 9/23.  Will undertake hemodialysis today via left IJ TDC. 2. ESRD: On hemodialysis on MWF schedule, orders in place for dialysis again today via left IJ TDC. 3. Anemia: Hemoglobin and hematocrit within acceptable range, will continue to monitor trend to decide on need to restart ESA. 4. CKD-MBD: Significantly elevated phosphorus level with calcium  level at goal.  Continue sevelamer  2.4 g 3 times daily AC. 5. Nutrition: Continue renal diet with fluid restriction and ongoing protein supplementation. 6. Hypertension: Blood pressures remain elevated on current therapy with amlodipine , clonidine , carvedilol , irbesartan  and UF with HD.  Will add doxazosin .  Subjective:   Reports to be feeling uncomfortable with some chest wall pain over TDC site   Objective:   BP (!) 155/118   Pulse 79   Temp 98.1 F (36.7 C)   Resp 18   Ht 5' 6 (1.676 m)   Wt 80 kg   SpO2 100%   BMI 28.47 kg/m   Physical Exam: Gen: Appears uncomfortable resting in bed, chewing on ice.  Oxygen via [place] CVS: Pulse regular rhythm, normal rate, S1 and S2 normal Resp: Diminished breath sounds over bases.  No rales/rhonchi.  Left IJ TDC Abd: Soft, flat, nontender, bowel sounds normal Ext: 1+ bilateral lower extremity edema.  Labs: BMET Recent Labs  Lab 05/19/24 0757 05/20/24 0541 05/21/24 0545 05/22/24 0444  NA 129* 129* 131* 131*  K 5.1 5.5* 4.6 4.7  CL 96* 92* 96* 94*  CO2 18* 18* 22 21*  GLUCOSE 72 91 84 85  BUN 58* 74* 49* 67*  CREATININE 9.67* 11.31* 9.24* 11.15*  CALCIUM  8.8* 8.6* 8.9 8.9  PHOS  --   --   --  8.0*   CBC Recent Labs  Lab 05/19/24 0757 05/20/24 0541 05/21/24 0545  WBC 8.4 7.6 6.3  NEUTROABS 5.0  --   --   HGB 10.1* 10.1* 10.1*  HCT 34.2* 32.9* 32.4*   MCV 102.1* 96.8 96.7  PLT 173 182 157    Medications:     acetaminophen   1,000 mg Oral TID   allopurinol   100 mg Oral Daily   amLODipine   10 mg Oral QHS   carvedilol   25 mg Oral BID WC   Chlorhexidine  Gluconate Cloth  6 each Topical Q0600   cloNIDine   0.1 mg Oral Daily   [START ON 05/23/2024] diphenhydrAMINE   50 mg Oral Once   furosemide   160 mg Oral BID   heparin   5,000 Units Subcutaneous Q8H   irbesartan   300 mg Oral Daily   predniSONE   50 mg Oral Q6H   sevelamer  carbonate  2.4 g Oral Q breakfast   Gordy Blanch, MD 05/22/2024, 8:36 AM

## 2024-05-23 ENCOUNTER — Inpatient Hospital Stay (HOSPITAL_COMMUNITY): Admitting: Certified Registered Nurse Anesthetist

## 2024-05-23 ENCOUNTER — Encounter (HOSPITAL_COMMUNITY): Payer: Self-pay | Admitting: Family Medicine

## 2024-05-23 ENCOUNTER — Encounter (HOSPITAL_COMMUNITY)
Admission: EM | Disposition: A | Discharge status: Home / Self Care | Payer: Self-pay | Source: Home / Self Care | Attending: Family Medicine

## 2024-05-23 ENCOUNTER — Other Ambulatory Visit (HOSPITAL_COMMUNITY)

## 2024-05-23 DIAGNOSIS — I120 Hypertensive chronic kidney disease with stage 5 chronic kidney disease or end stage renal disease: Secondary | ICD-10-CM

## 2024-05-23 DIAGNOSIS — T82898S Other specified complication of vascular prosthetic devices, implants and grafts, sequela: Secondary | ICD-10-CM | POA: Diagnosis not present

## 2024-05-23 DIAGNOSIS — I742 Embolism and thrombosis of arteries of the upper extremities: Secondary | ICD-10-CM

## 2024-05-23 DIAGNOSIS — N186 End stage renal disease: Secondary | ICD-10-CM

## 2024-05-23 DIAGNOSIS — Z992 Dependence on renal dialysis: Secondary | ICD-10-CM

## 2024-05-23 HISTORY — PX: IR DECLOT THROMB AGENT IMPLANT VASC DEVICE/CATH: IMG2293

## 2024-05-23 HISTORY — PX: IR AV DIALY SHUNT INTRO NEEDLE/INTRACATH INITIAL W/PTA/IMG LEFT: IMG6103

## 2024-05-23 HISTORY — PX: IR US GUIDE VASC ACCESS LEFT: IMG2389

## 2024-05-23 HISTORY — PX: RADIOLOGY WITH ANESTHESIA: SHX6223

## 2024-05-23 LAB — POCT I-STAT, CHEM 8
BUN: 84 mg/dL — ABNORMAL HIGH (ref 6–20)
BUN: 85 mg/dL — ABNORMAL HIGH (ref 6–20)
BUN: 90 mg/dL — ABNORMAL HIGH (ref 6–20)
BUN: 87 mg/dL — ABNORMAL HIGH (ref 6–20)
BUN: 91 mg/dL — ABNORMAL HIGH (ref 6–20)
Calcium, Ion: 1.13 mmol/L — ABNORMAL LOW (ref 1.15–1.40)
Calcium, Ion: 1.21 mmol/L (ref 1.15–1.40)
Calcium, Ion: 1.21 mmol/L (ref 1.15–1.40)
Calcium, Ion: 1.15 mmol/L (ref 1.15–1.40)
Calcium, Ion: 1.2 mmol/L (ref 1.15–1.40)
Chloride: 96 mmol/L — ABNORMAL LOW (ref 98–111)
Chloride: 96 mmol/L — ABNORMAL LOW (ref 98–111)
Chloride: 96 mmol/L — ABNORMAL LOW (ref 98–111)
Chloride: 99 mmol/L (ref 98–111)
Chloride: 97 mmol/L — ABNORMAL LOW (ref 98–111)
Creatinine, Ser: 13.7 mg/dL — ABNORMAL HIGH (ref 0.44–1.00)
Creatinine, Ser: 13.7 mg/dL — ABNORMAL HIGH (ref 0.44–1.00)
Creatinine, Ser: 13.8 mg/dL — ABNORMAL HIGH (ref 0.44–1.00)
Creatinine, Ser: 11.9 mg/dL — ABNORMAL HIGH (ref 0.44–1.00)
Creatinine, Ser: 13.5 mg/dL — ABNORMAL HIGH (ref 0.44–1.00)
Glucose, Bld: 117 mg/dL — ABNORMAL HIGH (ref 70–99)
Glucose, Bld: 118 mg/dL — ABNORMAL HIGH (ref 70–99)
Glucose, Bld: 188 mg/dL — ABNORMAL HIGH (ref 70–99)
Glucose, Bld: 58 mg/dL — ABNORMAL LOW (ref 70–99)
Glucose, Bld: 82 mg/dL (ref 70–99)
HCT: 32 % — ABNORMAL LOW (ref 36.0–46.0)
HCT: 32 % — ABNORMAL LOW (ref 36.0–46.0)
HCT: 32 % — ABNORMAL LOW (ref 36.0–46.0)
HCT: 31 % — ABNORMAL LOW (ref 36.0–46.0)
HCT: 32 % — ABNORMAL LOW (ref 36.0–46.0)
Hemoglobin: 10.9 g/dL — ABNORMAL LOW (ref 12.0–15.0)
Hemoglobin: 10.9 g/dL — ABNORMAL LOW (ref 12.0–15.0)
Hemoglobin: 10.9 g/dL — ABNORMAL LOW (ref 12.0–15.0)
Hemoglobin: 10.5 g/dL — ABNORMAL LOW (ref 12.0–15.0)
Hemoglobin: 10.9 g/dL — ABNORMAL LOW (ref 12.0–15.0)
Potassium: 5.7 mmol/L — ABNORMAL HIGH (ref 3.5–5.1)
Potassium: 6.5 mmol/L (ref 3.5–5.1)
Potassium: 6.6 mmol/L (ref 3.5–5.1)
Potassium: 5.8 mmol/L — ABNORMAL HIGH (ref 3.5–5.1)
Potassium: 6.3 mmol/L (ref 3.5–5.1)
Sodium: 124 mmol/L — ABNORMAL LOW (ref 135–145)
Sodium: 124 mmol/L — ABNORMAL LOW (ref 135–145)
Sodium: 125 mmol/L — ABNORMAL LOW (ref 135–145)
Sodium: 126 mmol/L — ABNORMAL LOW (ref 135–145)
Sodium: 125 mmol/L — ABNORMAL LOW (ref 135–145)
TCO2: 17 mmol/L — ABNORMAL LOW (ref 22–32)
TCO2: 18 mmol/L — ABNORMAL LOW (ref 22–32)
TCO2: 18 mmol/L — ABNORMAL LOW (ref 22–32)
TCO2: 19 mmol/L — ABNORMAL LOW (ref 22–32)
TCO2: 18 mmol/L — ABNORMAL LOW (ref 22–32)

## 2024-05-23 LAB — GLUCOSE, CAPILLARY
Glucose-Capillary: 41 mg/dL — CL (ref 70–99)
Glucose-Capillary: 86 mg/dL (ref 70–99)

## 2024-05-23 LAB — HEPATITIS B SURFACE ANTIBODY, QUANTITATIVE: Hep B S AB Quant (Post): <3.5 m[IU]/mL — ABNORMAL LOW

## 2024-05-23 SURGERY — RADIOLOGY WITH ANESTHESIA
Anesthesia: General

## 2024-05-23 MED ORDER — ALTEPLASE 2 MG IJ SOLR
INTRAMUSCULAR | Status: AC
  Filled 2024-05-23: qty 4

## 2024-05-23 MED ORDER — OXYCODONE HCL 5 MG PO TABS: 5.0000 mg | ORAL_TABLET | Freq: Once | ORAL | Status: DC | PRN

## 2024-05-23 MED ORDER — HEPARIN SODIUM (PORCINE) 1000 UNIT/ML DIALYSIS: 40.0000 [IU]/kg | INTRAMUSCULAR | Status: DC | PRN

## 2024-05-23 MED ORDER — DEXAMETHASONE SODIUM PHOSPHATE 10 MG/ML IJ SOLN
INTRAMUSCULAR | Status: DC | PRN
  Administered 2024-05-23: 4 mg via INTRAVENOUS

## 2024-05-23 MED ORDER — OXYCODONE HCL 5 MG/5ML PO SOLN: 5.0000 mg | Freq: Once | ORAL | Status: DC | PRN

## 2024-05-23 MED ORDER — ONDANSETRON HCL 4 MG/2ML IJ SOLN
INTRAMUSCULAR | Status: DC | PRN
  Administered 2024-05-23: 4 mg via INTRAVENOUS

## 2024-05-23 MED ORDER — PROPOFOL 10 MG/ML IV BOLUS
INTRAVENOUS | Status: DC | PRN
  Administered 2024-05-23: 100 mg via INTRAVENOUS
  Administered 2024-05-23: 50 mg via INTRAVENOUS

## 2024-05-23 MED ORDER — ALTEPLASE 1 MG/ML SYRINGE FOR VASCULAR PROCEDURE
INTRAMUSCULAR | Status: DC | PRN
  Administered 2024-05-23: 8 mg via INTRA_ARTERIAL

## 2024-05-23 MED ORDER — ALTEPLASE 2 MG IJ SOLR: 2.0000 mg | Freq: Once | INTRAMUSCULAR | Status: DC | PRN

## 2024-05-23 MED ORDER — LIDOCAINE-PRILOCAINE 2.5-2.5 % EX CREA: 1.0000 | TOPICAL_CREAM | CUTANEOUS | Status: DC | PRN

## 2024-05-23 MED ORDER — CHLORHEXIDINE GLUCONATE 0.12 % MT SOLN
OROMUCOSAL | Status: AC
  Administered 2024-05-23: 15 mL via OROMUCOSAL
  Filled 2024-05-23: qty 15

## 2024-05-23 MED ORDER — SODIUM CHLORIDE 0.9 % IV SOLN: INTRAVENOUS | Status: DC

## 2024-05-23 MED ORDER — LIDOCAINE 2% (20 MG/ML) 5 ML SYRINGE
INTRAMUSCULAR | Status: DC | PRN
Start: 2024-05-23 — End: 2024-05-23
  Administered 2024-05-23: 100 mg via INTRAVENOUS

## 2024-05-23 MED ORDER — HYDROMORPHONE HCL 1 MG/ML IJ SOLN
INTRAMUSCULAR | Status: DC | PRN
  Administered 2024-05-23: 1 mg via INTRAVENOUS

## 2024-05-23 MED ORDER — LIDOCAINE HCL (PF) 1 % IJ SOLN: 5.0000 mL | INTRAMUSCULAR | Status: DC | PRN

## 2024-05-23 MED ORDER — HYDROMORPHONE HCL 2 MG PO TABS
2.0000 mg | ORAL_TABLET | ORAL | Status: DC | PRN
  Administered 2024-05-23 – 2024-05-24 (×2): 2 mg via ORAL
  Filled 2024-05-23 (×3): qty 1

## 2024-05-23 MED ORDER — ACETAMINOPHEN 10 MG/ML IV SOLN: 1000.0000 mg | Freq: Once | INTRAVENOUS | Status: DC | PRN

## 2024-05-23 MED ORDER — ROCURONIUM BROMIDE 10 MG/ML (PF) SYRINGE
PREFILLED_SYRINGE | INTRAVENOUS | Status: DC | PRN
  Administered 2024-05-23: 80 mg via INTRAVENOUS

## 2024-05-23 MED ORDER — IOHEXOL 300 MG/ML  SOLN: 50.0000 mL | Freq: Once | INTRAMUSCULAR | Status: DC | PRN

## 2024-05-23 MED ORDER — FENTANYL CITRATE (PF) 100 MCG/2ML IJ SOLN: 25.0000 ug | INTRAMUSCULAR | Status: DC | PRN

## 2024-05-23 MED ORDER — ORAL CARE MOUTH RINSE: 15.0000 mL | Freq: Once | OROMUCOSAL | Status: AC

## 2024-05-23 MED ORDER — DIPHENHYDRAMINE HCL 50 MG/ML IJ SOLN
INTRAMUSCULAR | Status: DC | PRN
  Administered 2024-05-23: 50 mg via INTRAVENOUS

## 2024-05-23 MED ORDER — LIDOCAINE-EPINEPHRINE 1 %-1:100000 IJ SOLN
20.0000 mL | Freq: Once | INTRAMUSCULAR | Status: DC
  Filled 2024-05-23: qty 20

## 2024-05-23 MED ORDER — EPHEDRINE SULFATE-NACL 50-0.9 MG/10ML-% IV SOSY
PREFILLED_SYRINGE | INTRAVENOUS | Status: DC | PRN
  Administered 2024-05-23: 10 mg via INTRAVENOUS

## 2024-05-23 MED ORDER — DIPHENHYDRAMINE HCL 50 MG/ML IJ SOLN
INTRAMUSCULAR | Status: AC
  Filled 2024-05-23: qty 1

## 2024-05-23 MED ORDER — HYDROMORPHONE HCL 1 MG/ML IJ SOLN
INTRAMUSCULAR | Status: AC
  Filled 2024-05-23: qty 1

## 2024-05-23 MED ORDER — FENTANYL CITRATE (PF) 250 MCG/5ML IJ SOLN
INTRAMUSCULAR | Status: DC | PRN
  Administered 2024-05-23 (×2): 50 ug via INTRAVENOUS

## 2024-05-23 MED ORDER — HYDROMORPHONE HCL 1 MG/ML IJ SOLN
1.0000 mg | Freq: Once | INTRAMUSCULAR | Status: AC
  Administered 2024-05-23: 1 mg via INTRAVENOUS
  Filled 2024-05-23: qty 1

## 2024-05-23 MED ORDER — PENTAFLUOROPROP-TETRAFLUOROETH EX AERO
INHALATION_SPRAY | CUTANEOUS | Status: AC
  Filled 2024-05-23: qty 30

## 2024-05-23 MED ORDER — CHLORHEXIDINE GLUCONATE CLOTH 2 % EX PADS
6.0000 | MEDICATED_PAD | Freq: Every day | CUTANEOUS | Status: DC
  Administered 2024-05-24 – 2024-05-25 (×2): 6 via TOPICAL

## 2024-05-23 MED ORDER — FENTANYL CITRATE (PF) 100 MCG/2ML IJ SOLN
INTRAMUSCULAR | Status: AC
  Filled 2024-05-23: qty 2

## 2024-05-23 MED ORDER — CHLORHEXIDINE GLUCONATE 0.12 % MT SOLN: 15.0000 mL | Freq: Once | OROMUCOSAL | Status: AC

## 2024-05-23 MED ORDER — DEXTROSE 50 % IV SOLN
INTRAVENOUS | Status: AC
  Filled 2024-05-23: qty 50

## 2024-05-23 MED ORDER — INSULIN ASPART 100 UNIT/ML IJ SOLN
INTRAMUSCULAR | Status: DC | PRN
  Administered 2024-05-23: 5 [IU] via SUBCUTANEOUS

## 2024-05-23 MED ORDER — DEXTROSE 50 % IV SOLN
INTRAVENOUS | Status: DC | PRN
  Administered 2024-05-23 (×2): 25 g via INTRAVENOUS

## 2024-05-23 MED ORDER — HEPARIN SODIUM (PORCINE) 1000 UNIT/ML DIALYSIS: 1000.0000 [IU] | INTRAMUSCULAR | Status: DC | PRN

## 2024-05-23 MED ORDER — SUGAMMADEX SODIUM 200 MG/2ML IV SOLN
INTRAVENOUS | Status: DC | PRN
  Administered 2024-05-23: 200 mg via INTRAVENOUS

## 2024-05-23 MED ORDER — PENTAFLUOROPROP-TETRAFLUOROETH EX AERO: 1.0000 | INHALATION_SPRAY | CUTANEOUS | Status: DC | PRN

## 2024-05-23 MED ORDER — SODIUM ZIRCONIUM CYCLOSILICATE 10 G PO PACK
10.0000 g | PACK | Freq: Once | ORAL | Status: AC
  Administered 2024-05-23: 10 g via ORAL
  Filled 2024-05-23: qty 1

## 2024-05-23 MED ORDER — LIDOCAINE-EPINEPHRINE 1 %-1:100000 IJ SOLN
INTRAMUSCULAR | Status: AC
  Filled 2024-05-23: qty 1

## 2024-05-23 NOTE — Sedation Documentation (Signed)
 Patient moved to table by staff , secured, hooked to monitors, and now under the care of anesthesia. Please see charting in vitals per CRNA.

## 2024-05-23 NOTE — Procedures (Signed)
 Interventional Radiology Procedure Note  Procedure:  1) Left upper extremity arteriovenous graft declot 2) Left internal jugular tunneled hemodialysis catheter removal  Findings: Please refer to procedural dictation for full description. Thrombus extending from arterial anastomosis through indwelling venous outflow stent.  Technically successful pharmacomechanical declot with re-establishment of brisk antegrade flow.  L internal jugular tunneled hemodialysis catheter removed.  Complications: None immediate  Estimated Blood Loss: < 5 mL  Recommendations: LUE AVG ready for immediate use.   Ester Sides, MD

## 2024-05-23 NOTE — Transfer of Care (Signed)
 Immediate Anesthesia Transfer of Care Note  Patient: Kyri Shader  Procedure(s) Performed: RADIOLOGY WITH ANESTHESIA IR DECLOT THROMB AGENT IMPLANT VASC DEVICE/CATH IR US  GUIDE VASC ACCESS LEFT  Patient Location: PACU  Anesthesia Type:General  Level of Consciousness: awake, alert , and oriented  Airway & Oxygen Therapy: Patient connected to face mask  Post-op Assessment: Report given to RN and Post -op Vital signs reviewed and stable  Post vital signs: Reviewed and stable  Last Vitals:  Vitals Value Taken Time  BP 140/108 05/23/24 16:15  Temp 36.9 C 05/23/24 16:10  Pulse 68 05/23/24 16:17  Resp 16 05/23/24 16:20  SpO2 100 % 05/23/24 16:17  Vitals shown include unfiled device data.  Last Pain:  Vitals:   05/23/24 1319  TempSrc:   PainSc: 8       Patients Stated Pain Goal: 2 (05/21/24 0249)  Complications: No notable events documented.

## 2024-05-23 NOTE — Progress Notes (Signed)
 Patient ID: Linda [name], female   DOB: 1996/08/17, 28 y.o.   MRN: [id number] [facility] Progress Note   Assessment/ Plan:   1.  Thrombosed left upper arm AV graft: Plans in place for surgical thrombectomy on 9/23.  Left IJ TDC will be removed if thrombectomy is successful otherwise will need replacement/exchange because of poor function. 2. ESRD: On hemodialysis on MWF schedule and unable to undergo dialysis yesterday because of malfunctioning left IJ TDC.  Because of the challenges in her procedure pain management, catheter exchange was deferred and she will undergo thrombectomy of her left upper arm AV graft today under anesthesia.  Will undertake dialysis later today versus tomorrow pending completion of procedure. 3. Anemia: Hemoglobin and hematocrit within acceptable range, will continue to monitor trend to decide on need to restart ESA. 4. CKD-MBD: Significantly elevated phosphorus level with calcium  level at goal.  Continue sevelamer  2.4 g 3 times daily AC. 5. Nutrition: Continue renal diet with fluid restriction and ongoing protein supplementation. 6. Hypertension: Blood pressures remain elevated on current therapy with amlodipine , clonidine , carvedilol , irbesartan  and added doxazosin  yesterday.  Monitor with HD/UF  Subjective:   Denies any acute events overnight except for intermittent pain/discomfort from left IJ TDC and left upper arm.   Objective:   BP (!) 160/100 (BP Location: Right Arm)   Pulse 75   Temp 98.1 F (36.7 C)   Resp 18   Ht 5' 6 (1.676 m)   Wt 80.9 kg   SpO2 100%   BMI 28.79 kg/m   Physical Exam: Gen: Sitting up uncomfortably in bed, watching television CVS: Pulse regular rhythm, normal rate, S1 and S2 normal Resp: Diminished breath sounds over bases.  No rales/rhonchi.  Left IJ TDC Abd: Soft, flat, nontender, bowel sounds normal Ext: 1+ bilateral lower extremity edema.  Labs: BMET Recent Labs  Lab 05/19/24 0757 05/20/24 0541  05/21/24 0545 05/22/24 0444  NA 129* 129* 131* 131*  K 5.1 5.5* 4.6 4.7  CL 96* 92* 96* 94*  CO2 18* 18* 22 21*  GLUCOSE 72 91 84 85  BUN 58* 74* 49* 67*  CREATININE 9.67* 11.31* 9.24* 11.15*  CALCIUM  8.8* 8.6* 8.9 8.9  PHOS  --   --   --  8.0*   CBC Recent Labs  Lab 05/19/24 0757 05/20/24 0541 05/21/24 0545  WBC 8.4 7.6 6.3  NEUTROABS 5.0  --   --   HGB 10.1* 10.1* 10.1*  HCT 34.2* 32.9* 32.4*  MCV 102.1* 96.8 96.7  PLT 173 182 157    Medications:     acetaminophen   1,000 mg Oral TID   allopurinol   100 mg Oral Daily   amLODipine   10 mg Oral QHS   carvedilol   25 mg Oral BID WC   Chlorhexidine  Gluconate Cloth  6 each Topical Q0600   cloNIDine   0.1 mg Oral Daily   diphenhydrAMINE   50 mg Oral Once   doxazosin   2 mg Oral Daily   furosemide   160 mg Oral BID   heparin   5,000 Units Subcutaneous Q8H   irbesartan   300 mg Oral Daily   predniSONE   50 mg Oral Q6H   sevelamer  carbonate  2.4 g Oral Q breakfast   Gordy Blanch, MD 05/23/2024, 7:59 AM

## 2024-05-23 NOTE — Plan of Care (Signed)
 Patient calm and cooperative A&O X4. Patient complains pain medications are not effective, new orders place for 2 doses of IV pain medication. Large boil like cyst with hard white substance seen on right elbow and glutes. CHG bath given. Patient reminded of NPO status. Patient left with call bell in reach and side rails up.  Problem: Education: Goal: Knowledge of General Education information will improve Description: Including pain rating scale, medication(s)/side effects and non-pharmacologic comfort measures Outcome: Progressing   Problem: Health Behavior/Discharge Planning: Goal: Ability to manage health-related needs will improve Outcome: Progressing   Problem: Activity: Goal: Risk for activity intolerance will decrease Outcome: Progressing   Problem: Nutrition: Goal: Adequate nutrition will be maintained Outcome: Progressing   Problem: Coping: Goal: Level of anxiety will decrease Outcome: Progressing   Problem: Pain Managment: Goal: General experience of comfort will improve and/or be controlled Outcome: Progressing   Problem: Safety: Goal: Ability to remain free from injury will improve Outcome: Progressing

## 2024-05-23 NOTE — Anesthesia Procedure Notes (Signed)
 Procedure Name: Intubation Date/Time: 05/23/2024 2:37 PM  Performed by: Mannie Krystal LABOR, CRNAPre-anesthesia Checklist: Patient identified, Emergency Drugs available, Suction available and Patient being monitored Patient Re-evaluated:Patient Re-evaluated prior to induction Oxygen Delivery Method: Circle system utilized Preoxygenation: Pre-oxygenation with 100% oxygen Induction Type: IV induction Ventilation: Mask ventilation without difficulty Laryngoscope Size: Miller and 2 Grade View: Grade I Tube type: Oral Tube size: 7.0 mm Number of attempts: 1 Airway Equipment and Method: Stylet and Oral airway Placement Confirmation: ETT inserted through vocal cords under direct vision, positive ETCO2 and breath sounds checked- equal and bilateral Secured at: 22 cm Tube secured with: Tape Dental Injury: Teeth and Oropharynx as per pre-operative assessment

## 2024-05-23 NOTE — Plan of Care (Signed)

## 2024-05-23 NOTE — Progress Notes (Addendum)
 Progress Note   Patient: Linda [name] FMW:[id number] DOB: Aug 30, 1996 DOA: 05/19/2024     2 DOS: the patient was seen and examined on 05/23/2024 at 5:02PM      Brief hospital course: 28 y.o. F with ESRD MWF, undifferentiated MCTD, SLE and discoid lupus, G6PD deficiency, HFmrEF and HTN who presented with painful fistula and inability to access fistula.   Admitted and US  showed LUE stent appeared occluded and minimal flow through the LUE fistula.  TDC placed.  Immediate declot and dishcarge were not possible given her hyperalgesia and previous experience.  Therefore, IR plan for declot under anesthesia, which will have to wait for early this week     Assessment and Plan: * Vascular graft occlusion in LUE Admitted and tunneled catheter placed.  IR consulted for thrombectomy, but due to her baseline hyperalgesia and previous experience, decision was made to do this under anesthesia, which had to wait for Tuesday.  To the OR today for thrombectomy, reportedly worked well so TDC removed and not replaced. - To HD tonight    Hyperkalemia Unable to dialyze Monday due to malfunctioning TDC and clotted LUE fistula.  Today K up to 6.5 - HD tonight - [facility]  now   ESRD (end stage renal disease) on dialysis (HCC) Baseline HD MWF - Consult Nephrology - BMD per Nephrology   Essential hypertension Blood pressure elevated - Continue amlodipine , carvedilol , clonidine , furosemide , irbesartan , new doxazosin   SLE (systemic lupus erythematosus related syndrome) (HCC) MCTD Discoid lupus Follows with [facility], Dr. Ziolkowska, not on disease modifying therapy at this time  Chronic pain syndrome Given experience of hyperalgesia here, any consideration of daily opiate therapy as an outpatient should seriously weigh how this would exacerbate her hyperalgesia   Acute on chronic heart failure with reduced ejection fraction Presented with swelling and shortness of breath, due to missed  dialysis.  Treated with dialysis. -Volume management by HD - Continue home carvedilol , Lasix , irbesartan   Tobacco abuse Smoking cessation recommended  Gout No evidence of flare - Continue allopurinol   Anemia of chronic kidney disease Hemoglobin stable         Subjective: Complains of pain, she is not able to localize this well.  Says it is all over.  No fever, respiratory symptoms.     Physical Exam: BP (!) 137/110 (BP Location: Right Arm)   Pulse 64   Temp 98 F (36.7 C)   Resp 18   Ht (P) 5' 6 (1.676 m)   Wt (P) 80.9 kg   SpO2 95%   BMI (P) 28.79 kg/m   Adult female, standing at the sink, brushing her teeth, bending over the sink RRR, no murmurs, no peripheral edema Respiratory seems normal, lungs diminished, no rales appreciated Abdomen soft, no tenderness palpation Standing at the sink, brushing her teeth, responds to questions appropriately, appears sluggish and tired, she is leaning both elbows on the sink and not able to stand up straight   Data Reviewed: Basic metabolic panel shows hyperkalemia, hyponatremia, elevated creatinine CBC shows stable anemia  Discussed with Anesthesia and Nephrology   Family Communication:      Disposition: Status is: Inpatient 28 yo F with MCTD, renal failure on HD, presented with left upper arm fistula failure  Because of her history of excess pain, thrombectomy had to be done under general anesthesia this afternoon.  She has now missed 4 days of dialysis, require dialysis tonight prior to discharge        Author: Lonni SHAUNNA Dalton, MD 05/23/2024  8:02 PM  For on call review [email or web address].

## 2024-05-23 NOTE — Anesthesia Procedure Notes (Signed)
 Arterial Line Insertion Start/End9/23/2025 1:30 PM, 05/23/2024 1:35 PM Performed by: Waddell Lauraine NOVAK, MD, Mannie Krystal LABOR, CRNA, CRNA  Patient location: Pre-op . Preanesthetic checklist: patient identified, IV checked, site marked, risks and benefits discussed, surgical consent, monitors and equipment checked, pre-op  evaluation, timeout performed and anesthesia consent Lidocaine  1% used for infiltration Right, radial was placed Catheter size: 20 G Hand hygiene performed , maximum sterile barriers used  and Seldinger technique used Allen's test indicative of satisfactory collateral circulation Attempts: 1 Procedure performed using ultrasound guided technique. Ultrasound Notes:anatomy identified, needle tip was noted to be adjacent to the nerve/plexus identified and no ultrasound evidence of intravascular and/or intraneural injection Following insertion, Biopatch. Post procedure assessment: normal  Patient tolerated the procedure well with no immediate complications.

## 2024-05-23 NOTE — Consult Note (Signed)
 Chief Complaint:  LUE Graft occlusion and non-functional L permcath  Procedure: Fistulagram with possible intervention and L Permcath exchange vs replacement  Referring Provider(s): Dr. Gordy Blanch  Supervising Physician: Jennefer Rover  Patient Status: [facility] - In-pt  History of Present Illness: Linda [name] is a 28 y.o. female with a history of HTN and Lupus with ESRD on HD via LUE graft created in 2020. Patient presented to the ED on 9/14 after unsuccessful attempt to undergo HD at outpatient facility. Upper extremity doppler demonstrated an occluded LUE graft. She is known to IR from previous graft intervention in 2023 at which time she received tPA and stent placement. She was also reported to have extreme discomfort during that procedure and recommendation was made for general anesthesia in future procedures. She underwent tunneled line placement in IR with Dr. KANDICE Banner on 9/19 and has had extreme pain at her catheter site since. Per patient she went to dialysis following placement of line on 9/19 and catheter functioned for part of the session and then stopped functioning. Reports she was supposed to undergo dialysis yesterday, but was unable to due to catheter malfunction. IR consulted for fistulagram with possible declot as well as tunneled dialysis catheter exchange.   Patient sitting upright on the edge of bed on arrival to room. States that she continues to have severe pain in her left chest where the catheter is located and intermittently down her left arm to the area of her fistula. Admits to some decreased swelling of her arm in the area of the fistula compared to prior, but is still very sore and has decreased range of motion in her left arm due to pain. Patient also reports some fluid retention which she has noticed in her abdomen and face as well as mild shortness of breath. She is otherwise without complaints. NPO since midnight. All questions and concerns answered at the  bedside.   Patient is Full Code  Past Medical History:  Diagnosis Date   Abnormal ECG    a. In setting of LVH w/ repolarization abnormalities; b. 03/2019 MV: No ischemia/infarct. EF 55-65%.   Angioedema    ESRD on hemodialysis (HCC)    G6PD deficiency    GERD (gastroesophageal reflux disease)    Hypertension    Lupus    LVH (left ventricular hypertrophy)    a. 11/2017 Echo: EF 50-55%. triv AI. Mild MR. Mod L and mild R atrial enlargement. Nl RV size/fxn. Small pericardial effusion w/o tampondae; b. 03/2018 Echo: EF 55-60%, restrictive filling pattern. Nl RV size/fxn. Sev LAE. Mild MR, mild to mod TR/PR. Mod PAH. Triv effusion.   Migraines    Mixed connective tissue disease    Previous Dialysis patient [facility])    a. Off HD since late 2020.   Resistant hypertension    Septic prepatellar bursitis of right knee 08/19/2017    Past Surgical History:  Procedure Laterality Date   A/V FISTULAGRAM N/A 03/10/2024   Procedure: A/V Fistulagram;  Surgeon: Serene Gaile ORN, MD;  Location: [facility] INVASIVE CV LAB;  Service: Cardiovascular;  Laterality: N/A;   GRAFT APPLICATION Left 03/30/2019   Procedure: BRACHIAL CEPHALIC GRAFT CREATION;  Surgeon: Marea Selinda RAMAN, MD;  Location: [facility] ORS;  Service: Vascular;  Laterality: Left;   I & D EXTREMITY Right 08/06/2017   Procedure: IRRIGATION AND DEBRIDEMENT KNEE;  Surgeon: Kendal Franky SQUIBB, MD;  Location: [facility] OR;  Service: Orthopedics;  Laterality: Right;   IR THROMBECTOMY AV FISTULA W/THROMBOLYSIS/PTA/STENT INC/SHUNT/IMG LT  Left 05/18/2022   IR TUNNELED CENTRAL VENOUS CATH PLC W IMG  05/19/2024   IR US  GUIDE VASC ACCESS LEFT  05/18/2022   MULTIPLE TOOTH EXTRACTIONS      Allergies: Iodine , Lisinopril , Gabapentin, Ativan  [lorazepam ], Hydroxychloroquine , and Vicodin [hydrocodone -acetaminophen ]  Medications: Prior to Admission medications   Medication Sig Start Date End Date Taking? Authorizing Provider  acetaminophen  (TYLENOL ) 325 MG tablet Take 2 tablets (650 mg  total) by mouth every 6 (six) hours. Patient taking differently: Take 650 mg by mouth every 6 (six) hours as needed for mild pain (pain score 1-3). 03/11/24  Yes Elgergawy, Brayton RAMAN, MD  allopurinol  (ZYLOPRIM ) 100 MG tablet Take 1 tablet (100 mg total) by mouth daily. 03/11/24  Yes Elgergawy, Brayton RAMAN, MD  amLODipine  (NORVASC ) 10 MG tablet Take 2 tablets (20 mg total) by mouth at bedtime. 05/17/24  Yes [name], NP  carvedilol  (COREG ) 25 MG tablet Take 1 tablet (25 mg total) by mouth 2 (two) times daily with a meal. 05/17/24  Yes [name], NP  cloNIDine  (CATAPRES ) 0.1 MG tablet Take 1 tablet (0.1 mg total) by mouth daily. 03/11/24  Yes Elgergawy, Brayton RAMAN, MD  furosemide  (LASIX ) 80 MG tablet Take 1 tablet (80 mg total) by mouth daily. Patient taking differently: Take 160 mg by mouth 2 (two) times daily. 12/23/23  Yes Fairy Frames, MD  irbesartan  (AVAPRO ) 300 MG tablet Take 1 tablet (300 mg total) by mouth daily. 05/17/24  Yes [name], NP  lidocaine -prilocaine  (EMLA ) cream Apply 1 Application topically daily as needed (for dialysis). 01/14/24  Yes [provider]  ondansetron  (ZOFRAN ) 4 MG tablet Take 1 tablet (4 mg total) by mouth every 6 (six) hours. 04/06/24  Yes Lang Norleen POUR, PA-C  oxyCODONE  (ROXICODONE ) 5 MG immediate release tablet Take 1 tablet (5 mg total) by mouth every 4 (four) hours as needed for severe pain (pain score 7-10). 04/07/24  Yes Yolande Lamar BROCKS, MD  sevelamer  carbonate (RENVELA ) 2.4 g PACK Take 2.4 g by mouth daily. 01/21/24  Yes [provider]  tiZANidine  (ZANAFLEX ) 4 MG tablet Take 1 tablet (4 mg total) by mouth daily as needed for muscle spasms. 05/17/24  Yes Billy Philippe R, NP  Blood Pressure KIT Uncontrolled blood pressure 03/11/24   Elgergawy, Brayton RAMAN, MD  Blood Pressure Monitor MISC Use to check blood pressure 03/11/24   Elgergawy, Brayton RAMAN, MD  medroxyPROGESTERone  (DEPO-PROVERA ) 150 MG/ML injection Inject 1 mL (150  mg total) into the muscle every 3 (three) months. 10/29/22   Rudy Carlin LABOR, MD  nitroGLYCERIN  (NITROSTAT ) 0.4 MG SL tablet Place 1 tablet (0.4 mg total) under the tongue every 5 (five) minutes as needed for chest pain. 10/28/23   Rai, Ripudeep K, MD  ondansetron  (ZOFRAN -ODT) 4 MG disintegrating tablet Take 1 tablet (4 mg total) by mouth every 8 (eight) hours as needed for nausea or vomiting. Patient taking differently: Take 4 mg by mouth daily as needed for nausea or vomiting. 10/28/23   [name] Nydia POUR, MD     Family History  Problem Relation Age of Onset   Lupus Mother    Hypertension Mother    Kidney disease Mother    Heart Problems Mother    Coronary artery disease Mother 64       stent   Diabetes Maternal Grandmother     Social History   Socioeconomic History   Marital status: Single    Spouse name: Not on file   Number of children:  0   Years of education: Not on file   Highest education level: Some college, no degree  Occupational History   Not on file  Tobacco Use   Smoking status: Every Day    Current packs/day: 0.25    Average packs/day: 0.3 packs/day for 9.3 years (2.3 ttl pk-yrs)    Types: E-cigarettes, Cigarettes    Start date: 02/11/2015   Smokeless tobacco: Never   Tobacco comments:    02/03/2024 Patient smokes 3 cigarettes daily  Vaping Use   Vaping status: Every Day  Substance and Sexual Activity   Alcohol use: No    Alcohol/week: 0.0 standard drinks of alcohol   Drug use: No   Sexual activity: Yes    Partners: Male    Birth control/protection: None  Other Topics Concern   Not on file  Social History Narrative   Lives with boyfriend   Social Drivers of Health   Financial Resource Strain: Low Risk  (02/03/2024)   Overall Financial Resource Strain (CARDIA)    Difficulty of Paying Living Expenses: Not hard at all  Food Insecurity: No Food Insecurity (05/19/2024)   Hunger Vital Sign    Worried About Running Out of Food in the Last Year: Never true     Ran Out of Food in the Last Year: Never true  Transportation Needs: No Transportation Needs (05/19/2024)   PRAPARE - Administrator, Civil Service (Medical): No    Lack of Transportation (Non-Medical): No  Physical Activity: Insufficiently Active (02/03/2024)   Exercise Vital Sign    Days of Exercise per Week: 2 days    Minutes of Exercise per Session: 10 min  Stress: No Stress Concern Present (02/03/2024)   [employer] of Occupational Health - Occupational Stress Questionnaire    Feeling of Stress : Not at all  Social Connections: Socially Isolated (02/03/2024)   Social Connection and Isolation Panel    Frequency of Communication with Friends and Family: Never    Frequency of Social Gatherings with Friends and Family: Once a week    Attends Religious Services: Never    Database administrator or Organizations: Yes    Attends Banker Meetings: Never    Marital Status: Never married     Review of Systems  Respiratory:  Positive for shortness of breath.   Cardiovascular:  Positive for chest pain (in the area of her left HD catheter).  Gastrointestinal:  Positive for abdominal distention.  Patient denies any headache, chest pain, fatigue, weakness, confusion, abdominal pain, N/V, or fever/chills. All other systems are negative.   Vital Signs: BP (!) 160/100 (BP Location: Right Arm)   Pulse 75   Temp 98.1 F (36.7 C)   Resp 18   Ht 5' 6 (1.676 m)   Wt 178 lb 5.6 oz (80.9 kg)   SpO2 100%   BMI 28.79 kg/m    Physical Exam Constitutional:      Appearance: Normal appearance.  HENT:     Mouth/Throat:     Mouth: Mucous membranes are moist.     Pharynx: Oropharynx is clear.  Cardiovascular:     Rate and Rhythm: Normal rate and regular rhythm.     Heart sounds: Normal heart sounds.  Pulmonary:     Effort: Pulmonary effort is normal.     Breath sounds: Normal breath sounds.  Abdominal:     General: Abdomen is flat. There is distension (minimal).      Palpations: Abdomen is soft.  Tenderness: There is no abdominal tenderness.  Musculoskeletal:        General: No swelling.     Comments: Left chest is extremely tender to palpation in the area of left tunneled dialysis catheter. Minimal swelling at the upper insertion site, but no evidence of bruising or active bleeding. TDC is unremarkable. LUE graft with no palpable thrill or bruit  Skin:    General: Skin is warm and dry.  Neurological:     General: No focal deficit present.     Mental Status: She is alert and oriented to person, place, and time.  Psychiatric:        Mood and Affect: Mood normal.        Behavior: Behavior normal.     Imaging: IR TUNNELED CENTRAL VENOUS CATH PLC W IMG Addendum Date: 05/19/2024 ADDENDUM REPORT: 05/19/2024 17:26 ADDENDUM: Under exam the exam is a placement of a tunneled hemodialysis catheter with ultrasound and fluoroscopic guidance Electronically Signed   By: Cordella Banner   On: 05/19/2024 17:26   Result Date: 05/19/2024 INDICATION: Infiltrated left arm AV fistula causing exquisite pain in his now unusable for hemodialysis. Planned placement of a left IJ hemodialysis catheter as the right internal jugular vein is occluded. EXAM: TUNNELED PICC LINE WITH ULTRASOUND AND FLUOROSCOPIC GUIDANCE MEDICATIONS: 2 g Ancef . The antibiotic was given in an appropriate time interval prior to skin puncture. ANESTHESIA/SEDATION: Moderate (conscious) sedation was employed during this procedure. A total of Versed  4 mg and Fentanyl  200 mcg was administered intravenously by the radiology nurse. Total intra-service moderate Sedation Time: 23 minutes. The patient's level of consciousness and vital signs were monitored continuously by radiology nursing throughout the procedure under my direct supervision. FLUOROSCOPY: Radiation Exposure Index (as provided by the fluoroscopic device): 49 mGy Kerma COMPLICATIONS: None immediate. PROCEDURE: Informed written consent was obtained from  the patient after a discussion of the risks, benefits, and alternatives to treatment. Questions regarding the procedure were encouraged and answered. The right neck and chest were prepped with chlorhexidine  in a sterile fashion, and a sterile drape was applied covering the operative field. Maximum barrier sterile technique with sterile gowns and gloves were used for the procedure. A timeout was performed prior to the initiation of the procedure. After creating a small venotomy incision, a micropuncture kit was utilized to access the left internal jugular vein under direct, real-time ultrasound guidance after the overlying soft tissues were anesthetized with 1% lidocaine  with epinephrine . Ultrasound image documentation was performed. The microwire was kinked to measure appropriate catheter length. The micropuncture sheath was exchanged for a peel-away sheath over a guidewire. A 15 French tunneled hemodialysis catheter measuring 28 cm was prepped at the table. The catheter was then placed through the peel-away sheath with tip ultimately positioned at the superior caval-atrial junction. Final catheter positioning was confirmed and documented with a spot radiographic image. The catheter aspirates and flushes normally. The catheter was flushed with appropriate volume heparin  dwells. The catheter exit site was secured with a 0-Prolene retention suture. The venotomy incision was closed with an interrupted 4-0 Vicryl, Dermabond and Steri-strips. Dressings were applied. The patient tolerated the procedure well without immediate post procedural complication. FINDINGS: After catheter placement, the tip lies within the superior cavoatrial junction. The catheter aspirates and flushes normally and is ready for immediate use. IMPRESSION: Successful placement 28 cm left-sided hemodialysis tunneled catheter. Electronically Signed: By: Cordella Banner On: 05/19/2024 17:21   VAS US  DUPLEX DIALYSIS ACCESS (AVF, AVG) Result Date:  05/19/2024 DIALYSIS  ACCESS Patient Name:  BO ROGUE File  Date of Exam:   05/19/2024 Medical Rec #: [id number]              Accession #:    [id number] Date of Birth: February 07, 1996              Patient Gender: F Patient Age:   27 years Exam Location:  [facility] Procedure:      VAS US  DUPLEX DIALYSIS ACCESS (AVF, AVG) Referring Phys: MICHAEL PENNA --------------------------------------------------------------------------------  Reason for Exam: No palpable thrill for AVF/AVG. Access Site: Left Upper Extremity. Access Type: Brachial-Axillary. History: Stent in axilla. Pain during dialysis session on Wednesday after nurse          stuck fistula lower than normal. Patient halted session. Presents with          increased pain and swelling of left upper extremity and diminished          thrill. Limitations: Pain with touch and swelling Comparison Study: No prior study Performing Technologist: Alberta Lis RVS  Examination Guidelines: A complete evaluation includes B-mode imaging, spectral Doppler, color Doppler, and power Doppler as needed of all accessible portions of each vessel. Unilateral testing is considered an integral part of a complete examination. Limited examinations for reoccurring indications may be performed as noted.     Summary: Stent within upper arm appears occluded. Minimal flow noted throughout left upper extremity fistula.   Diagnosing physician: Debby Robertson Electronically signed by Debby Robertson on 05/19/2024 at 3:40:32 PM.    --------------------------------------------------------------------------------   Final    DG Chest Portable 1 View Result Date: 05/19/2024 CLINICAL DATA:  Left upper extremity pain and edema. History of dialysis. EXAM: PORTABLE CHEST 1 VIEW COMPARISON:  May 14, 2024. FINDINGS: Stable cardiomegaly. Both lungs are clear. The visualized skeletal structures are unremarkable. IMPRESSION: No active disease. Electronically Signed   By: Lynwood Landy Raddle M.D.    On: 05/19/2024 08:06   DG Chest 2 View Result Date: 05/14/2024 EXAM: 2 VIEW(S) XRAY OF THE CHEST 05/14/2024 05:25:32 AM COMPARISON: 04/07/2024 CLINICAL HISTORY: Pt in with reported sob and cough that began last night. Dialysis pt, last session Friday. Also reporting some L back pain. FINDINGS: LUNGS AND PLEURA: Pulmonary edema, mild. No focal pulmonary opacity. No pleural effusion. No pneumothorax. HEART AND MEDIASTINUM: Cardiomegaly. No acute abnormality of the cardiac and mediastinal silhouettes. BONES AND SOFT TISSUES: No acute osseous abnormality. Vascular stent in left upper arm. IMPRESSION: 1. Cardiomegaly. 2. Mild pulmonary edema. Electronically signed by: Waddell Calk MD 05/14/2024 05:59 AM EDT RP Workstation: [id number]   CT ABDOMEN PELVIS WO CONTRAST Result Date: 04/28/2024 EXAM: CT ABDOMEN AND PELVIS WITHOUT CONTRAST 04/28/2024 05:58:43 AM TECHNIQUE: CT of the abdomen and pelvis was performed without the administration of intravenous contrast. Multiplanar reformatted images are provided for review. Automated exposure control, iterative reconstruction, and/or weight-based adjustment of the mA/kV was utilized to reduce the radiation dose to as low as reasonably achievable. COMPARISON: 04/24/2024 CLINICAL HISTORY: Abdominal/flank pain, stone suspected. Back pain. FINDINGS: LOWER CHEST: Mild atelectasis noted within the lung bases. No pleural fluid or consolidative change. LIVER: Normal size and contour. GALLBLADDER AND BILE DUCTS: No wall thickening. No cholelithiasis. No biliary ductal dilatation. SPLEEN: Normal size. No focal lesion. PANCREAS: No mass. No ductal dilatation. ADRENAL GLANDS: Normal appearance. No mass. KIDNEYS, URETERS AND BLADDER: No stones in the kidneys or ureters. No hydronephrosis. No perinephric or periureteral stranding. Urinary bladder is unremarkable. GI AND BOWEL: The appendix is visualized and  appears normal. Mild retained stool within the ascending colon and rectum. There  is no bowel obstruction. No bowel wall thickening. PERITONEUM AND RETROPERITONEUM: No ascites. No free air. VASCULATURE: Age advanced aortic atherosclerotic calcification. LYMPH NODES: No lymphadenopathy. REPRODUCTIVE ORGANS: Uterus and adnexal structures appear normal. BONES AND SOFT TISSUES: Unchanged bilateral L5 pars defects with minimal, grade 1 anterolisthesis of L5 on S1. No acute osseous finding. Extensive soft tissue calcifications identified in the posterior gluteal regions and lower back. IMPRESSION: 1. No acute findings in the abdomen or pelvis. 2. Cardiomegaly and age-advanced aortic atherosclerotic calcification. Electronically signed by: Waddell Calk MD 04/28/2024 06:31 AM EDT RP Workstation: [id number]    Labs:  CBC: Recent Labs    05/14/24 0504 05/19/24 0757 05/20/24 0541 05/21/24 0545  WBC 7.2 8.4 7.6 6.3  HGB 11.3* 10.1* 10.1* 10.1*  HCT 37.0 34.2* 32.9* 32.4*  PLT 128* 173 182 157    COAGS: Recent Labs    10/10/23 0201 04/07/24 1215  INR 1.2 1.1  APTT 36  --     BMP: Recent Labs    05/19/24 0757 05/20/24 0541 05/21/24 0545 05/22/24 0444  NA 129* 129* 131* 131*  K 5.1 5.5* 4.6 4.7  CL 96* 92* 96* 94*  CO2 18* 18* 22 21*  GLUCOSE 72 91 84 85  BUN 58* 74* 49* 67*  CALCIUM  8.8* 8.6* 8.9 8.9  CREATININE 9.67* 11.31* 9.24* 11.15*  GFRNONAA 5* 4* 5* 4*    LIVER FUNCTION TESTS: Recent Labs    04/07/24 1215 05/14/24 0504 05/19/24 0757 05/20/24 0541 05/22/24 0444  BILITOT 0.8 1.0 1.1 0.9  --   AST 26 24 24 18   --   ALT 19 13 16 13   --   ALKPHOS 104 69 80 80  --   PROT 9.2* 7.6 7.2 7.0  --   ALBUMIN  3.4* 3.2* 3.0* 2.9* 2.7*    TUMOR MARKERS: No results for input(s): AFPTM, CEA, CA199, CHROMGRNA in the last 8760 hours.  Assessment and Plan:  LUE Graft Occlusion: Linda [name] is a 28 y.o. female with a history of LUE graft occlusion s/p declot and stent placement in 2023 with Dr. Gar. She underwent left TDC placement on  9/19 due to right internal jugular occlusion, but this has repeatedly malfunctioned and patient has not received a full dialysis treatment since prior to arrival. Patient has also been having extreme soreness of her left chest in the area of her dialysis catheter. IR consulted for fistulagram with possible declot as well as TDC exchange.  Per patient, she was informed by Dr. Tobie that [facility] would be removed following her procedure. Discussed with patient that this would be up to the physician, but that there was a high chance this would not occur as this is not our standard protocol. Patient verbalized understanding and is okay should she need to undergo [facility] exchange as well. Procedure to be performed under general anesthesia.  -NPO since midnight -Contrast Allergy Premedication ordered out of caution -VSS -Potassium 4.7, BUN/Cr 67/11.15, Phosphorus 8.0 -Plan is for procedure at 2:00pm with general anesthesia  Risks and benefits of the dialysis circuit study were discussed with the patient including, but not limited to bleeding, infection, vascular injury, and inability to improve the function of the fistula/graft which would require placement of a tunneled dialysis catheter.  Risks and benefits discussed with the patient including, but not limited to bleeding, infection, vascular injury, pneumothorax which may require chest tube placement, air embolism or even death  All of the patient's questions were answered, patient is agreeable to proceed. Consent signed and in chart.   Thank you for allowing our service to participate in Linda [name] 's care.    Electronically Signed: Glennon CHRISTELLA Bal, PA-C   05/23/2024, 8:51 AM     I spent a total of 40 Minutes in face to face in clinical consultation, greater than 50% of which was counseling/coordinating care for thrombosed LUE Graft for declot and exchange/replacement of tunneled HD catheter.

## 2024-05-24 ENCOUNTER — Encounter (HOSPITAL_COMMUNITY): Payer: Self-pay

## 2024-05-24 DIAGNOSIS — Z765 Malingerer [conscious simulation]: Secondary | ICD-10-CM | POA: Diagnosis not present

## 2024-05-24 DIAGNOSIS — Z992 Dependence on renal dialysis: Secondary | ICD-10-CM

## 2024-05-24 DIAGNOSIS — T82898A Other specified complication of vascular prosthetic devices, implants and grafts, initial encounter: Secondary | ICD-10-CM

## 2024-05-24 DIAGNOSIS — I5022 Chronic systolic (congestive) heart failure: Secondary | ICD-10-CM | POA: Diagnosis not present

## 2024-05-24 DIAGNOSIS — I10 Essential (primary) hypertension: Secondary | ICD-10-CM

## 2024-05-24 DIAGNOSIS — N186 End stage renal disease: Secondary | ICD-10-CM

## 2024-05-24 HISTORY — PX: IR REMOVAL TUN CV CATH W/O FL: IMG2289

## 2024-05-24 MED ORDER — AMLODIPINE BESYLATE 10 MG PO TABS: 10.0000 mg | ORAL_TABLET | Freq: Every day | ORAL | 1 refills | Status: AC

## 2024-05-24 MED ORDER — PENTAFLUOROPROP-TETRAFLUOROETH EX AERO
INHALATION_SPRAY | CUTANEOUS | Status: AC
  Filled 2024-05-24: qty 30

## 2024-05-24 MED ORDER — OXYCODONE HCL 5 MG PO TABS
10.0000 mg | ORAL_TABLET | Freq: Four times a day (QID) | ORAL | Status: DC | PRN
  Administered 2024-05-24 (×2): 10 mg via ORAL
  Filled 2024-05-24 (×2): qty 2

## 2024-05-24 MED ORDER — DOXAZOSIN MESYLATE 2 MG PO TABS: 2.0000 mg | ORAL_TABLET | Freq: Every day | ORAL | 0 refills | Status: AC

## 2024-05-24 NOTE — Discharge Planning (Signed)
 [facility] Patient Discharge Orders- [facility] CLINIC: [facility]  Patient's name: Linda [name] Admit/DC Dates: 05/19/2024 - 05/24/2024  Discharge Diagnoses: Clotted AVG s/p successful thrombectomy per IR    Outpatient Dialysis Orders:  -Heparin : No change  -EDW:  No change  -[place]: No change   Anemia Aranesp : Given: --   Date of last dose/amount: --   PRBC's Given: -- Date/# of units: -- ESA dose for discharge:   Recent Labs  Lab 05/22/24 0444 05/23/24 1343 05/23/24 1704  HGB  --    < > 10.9*  K 4.7   < > 6.3*  CALCIUM  8.9  --   --   PHOS 8.0*  --   --   ALBUMIN  2.7*  --   --    < > = values in this interval not displayed.    Access intervention/Change: LUE AVG declot with removal of L internal jugular TDC on 05/23/24 per IR    Medications: -IV Antibiotics:  None   OTHER/APPTS/LABS    Completed by: Maisie Ronnald Acosta PA-C   D/C Meds to be reconciled by nurse after every discharge.    Reviewed by: MD:______ RN_______

## 2024-05-24 NOTE — Progress Notes (Signed)
 Pt arrived back from dialysis. We asked her how she will be getting home and to let us  know when her ride arrives. Pt stated she did not know she could leave tonight and no one would be available to drive this time of night to get her. Pt says she will get a ride tomorrow. Franky, MD aware.

## 2024-05-24 NOTE — Plan of Care (Signed)

## 2024-05-24 NOTE — Anesthesia Postprocedure Evaluation (Signed)
 Anesthesia Post Note  Patient: Linda [name]  Procedure(s) Performed: RADIOLOGY WITH ANESTHESIA IR DECLOT THROMB AGENT IMPLANT VASC DEVICE/CATH IR US  GUIDE VASC ACCESS LEFT     Patient location during evaluation: PACU Anesthesia Type: General Level of consciousness: awake and alert and patient cooperative Pain management: satisfactory to patient Vital Signs Assessment: post-procedure vital signs reviewed and stable Respiratory status: spontaneous breathing, nonlabored ventilation and patient connected to nasal cannula oxygen Cardiovascular status: blood pressure returned to baseline Anesthetic complications: no Comments: Initial glucose on arrival to PACU was 41 - symptomatic with nausea. Received 25g of D50 with improvement in symptoms and BGL to high-80s. Repeat K at that time was 6.2. Contacted hospitalist C. Danford to notify and plan for dialysis. Relayed message to patient and PACU nursing staff. Stable for return to floor and dialysis as soon as possible.   No notable events documented.              Linda [name]

## 2024-05-24 NOTE — Progress Notes (Signed)
   05/24/24 0230  Vitals  Temp 97.7 F (36.5 C)  Temp Source Oral  BP (!) 170/123  BP Location Right Arm  BP Method Automatic  Patient Position (if appropriate) Lying  Pulse Rate 86  ECG Heart Rate 88  Resp 14  Oxygen Therapy  SpO2 100 %  During Treatment Monitoring  Blood Flow Rate (mL/min) 0 mL/min  Arterial Pressure (mmHg) 0 mmHg  Venous Pressure (mmHg) 0 mmHg  TMP (mmHg) 24.64 mmHg  Ultrafiltration Rate (mL/min) 850 mL/min  Dialysate Flow Rate (mL/min) 300 ml/min  Dialysate Potassium Concentration 2  Dialysate Calcium  Concentration 2.5  Duration of HD Treatment -hour(s) 3.5 hour(s)  Cumulative Fluid Removed (mL) per Treatment  1999.73  HD Safety Checks Performed Yes  Intra-Hemodialysis Comments Tx completed  Post Treatment  Dialyzer Clearance Clear  Liters Processed 73.5  Fluid Removed (mL) 2000 mL  Tolerated HD Treatment Yes  AVG/AVF Arterial Site Held (minutes) 10 minutes  AVG/AVF Venous Site Held (minutes) 10 minutes  Fistula / Graft Left Upper arm  No placement date or time found.   Orientation: Left  Access Location: Upper arm  Site Condition No complications  Fistula / Graft Assessment Bruit;Thrill;Present  Status Deaccessed  Needle Size 15  Drainage Description None   Left chest dialysis catheter removed 05/23/24, small bleeding, pressure dressing with surgi foam dressing applied.Pt escorted to floor.[address].

## 2024-05-24 NOTE — Progress Notes (Signed)
 PROGRESS NOTE    Linda [name]  FMW:[id number] DOB: February 26, 1996 DOA: 05/19/2024 PCP: [name], NP  Subjective: Pt seen and examined. Discussed with nephrology. Pt can be discharged today but pt insisting that she gets another HD session today. Pt confirms 7 AM chair time on M, W, F as outpatient HD at [facility].   Hospital Course: CC: left arm pain at fistula site HPI: Linda [name] is a 28 y.o. female with medical history significant of SLE, ESRD on hemodialysis, hypertension, HFrEF, rheumatoid arthritis presenting with vascular graft occlusion.  Patient with noted baseline history of ESRD on hemodialysis Monday Wednesday Friday.  Noted recent admission 02/2024 for issues including volume overload and respiratory failure requiring intubation. Patient states that she is compliant with HD. Patient states she is going on a regular course of time this week including an extra session on Tuesday for concern for volume overload.  Reports a roughly 20 pound weight gain within the past week or so that is well above baseline.  Patient states she noticed some mild left upper extremity pain redness and swelling over the past 3 days.  Patient went for dialysis today and facility was unable to draw blood from the graft area.  Reports worsening redness and swelling over the affected area.  Patient denies any prior episodes like this in the past.  Denies any recent trauma to the affected area.  Denies any prior episodes like this in the past.  Does still make a trace amount of urine on a regular basis in the setting of ESRD.  Reports compliance overall with hemodialysis as well as medication regimen.  Was unable to obtain dialysis today secondary to graft occlusion.  Denies any prior episodes like this in the past.  Smokes 1 black a mile per day.  Is followed by [facility] health rheumatology. Presented to the ER afebrile, blood pressures 140s to 160s over 100s.  Satting well on  room air.  White count 8.4, hemoglobin 10.1, platelets 173, creatinine 9.67, platelets 258.  Bicarb 18.  Chest x-ray stable.  Vascular ultrasound duplex of dialysis catheter showing positive occlusion.  Interventional radiology consulted for further evaluation.  Significant Events: Admitted 05/19/2024 for occluded AVF   Admission Labs: Na 129, K 5.1, CO2 of 18, BUN 58, Scr 9.67, glu 72 WBC 8.4, HgB 10.1, plt 173 Mg 2.2  Admission Imaging Studies: CXR No active disease.   Significant Labs:   Significant Imaging Studies:   Antibiotic Therapy: Anti-infectives (From admission, onward)    Start     Dose/Rate Route Frequency Ordered Stop   05/19/24 1600  ceFAZolin  (ANCEF ) IVPB 2g/100 mL premix        over 30 Minutes Intravenous Continuous PRN 05/19/24 1607 05/19/24 1600   05/19/24 1400  ceFAZolin  (ANCEF ) IVPB 2g/100 mL premix  Status:  Discontinued        2 g 200 mL/hr over 30 Minutes Intravenous On call 05/19/24 1356 05/20/24 1400       Procedures: 05-19-2024 temporary dialysis catheter placement 09-23-205 left AVF declot, removal of temporary dialysis catheter  Consultants:     Assessment and Plan: * Vascular graft occlusion On admission 05-19-2024 Noted worsening left upper extremity redness pain and swelling associated with vascular graft No discernible thrill or bruit on auscultation In discussion with Dr. Pamella, IR and vascular surgery have been consulted to further evaluate Upper extremity ultrasound pending Pain control Follow-up with vascular and IR recommendations in the interim Follow-up Prior to 05/24/24  had AVF declot on 05-23-2024 and her temporary dialysis catheter removed  05-24-2024 stable for DC to home per nephrology.   Drug-seeking behavior Prior to 05/24/24 Given experience of hyperalgesia here, any consideration of daily opiate therapy as an outpatient should seriously weigh how this would exacerbate her hyperalgesia   05/24/24 pt claiming that  po dilaudid  does not work and wants IV opiates. This request was denied. Her PDMP reviewed. She does not have any pain clinic provider Rx her opiates. She is only getting opiate Rx from ER and hospitalists. She will not be discharged on any opiates. Controlled Medication Caution Flag placed on pt's EMR.    Chronic HFrEF (heart failure with reduced ejection fraction) (HCC) On admission 05-19-2024 2D echo July 2025 with EF of 30 to 35% Appears fairly euvolemic to dry on presentation Noted dialysis dependent volume status though does make a small amount of urine Weight today 68 kg Monitor volume status closely with treatment Follow  Prior to 05/24/24 Presented with swelling and shortness of breath, due to missed dialysis.  Treated with dialysis. -Volume management by HD - Continue home carvedilol , Lasix , irbesartan   05/24/24 resolved acute on chronic systolic CHF. Present on admission.   Hyperkalemia On admission 05-19-2024 Unable to dialyze Monday due to malfunctioning TDC and clotted LUE fistula.  Today K up to 6.5 - HD tonight - Lokelma  now  Prior to 05/24/24 resolved with HD.   ESRD (end stage renal disease) on dialysis [facility]) On admission 05-19-2024. Baseline ESRD on hemodialysis Monday Wednesday Friday Positive concurrent left upper extremity graft occlusion pending IR/vascular surgery intervention Had extra session of dialysis earlier this week per patient  Renal function appears near baseline Appears euvolemic  Consult nephrology Monitor Prior to 05/24/24 had temporary HD catheter placed on 05-19-2024. Had HD on 05-20-2024, 05-23-2024  05/24/24 pt usually get outpatient HD at [facility] with 7 AM chair time. Discussed with Dr. Tobie with nephrology. [name] will order HD for today and pt can be discharged to home after HD. Pt will resume outpatient HD this coming Friday.    Tobacco abuse On admission 05-19-2024 smoker daily Discussed cessation at length Monitor  SLE  (systemic lupus erythematosus related syndrome) (HCC) On admission 05-19-2024 Baseline discoid SLE Followed by [facility] No longer on thalidomide or plaquenil   Monitor  Prior to 05/24/24 Follows with [facility], Dr. Ziolkowska, not on disease modifying therapy at this time    Essential hypertension On admission 05-19-2024 Recalcitrant hypertension despite multiple medications Blood pressure 140s to 160s over 100s Continue home regimen including clonidine , Coreg , Norvasc  Titrate as appropriate Follow  Prior to 05/24/24 - Continue amlodipine , carvedilol , clonidine , furosemide , irbesartan , new doxazosin    05/24/24 remains on - Continue amlodipine , carvedilol , clonidine , furosemide , irbesartan , doxazosin     DVT prophylaxis: heparin  injection 5,000 Units Start: 05/19/24 2200    Code Status: Full Code Family Communication: pt is decisional. No family at bedside Disposition Plan: return home Reason for continuing need for hospitalization: stable for DC.  Objective: Vitals:   05/24/24 0145 05/24/24 0200 05/24/24 0230 05/24/24 0837  BP: (!) 178/122 (!) 178/122 (!) 170/123 (!) 163/126  Pulse: 84 82 86 82  Resp: 13 13 14 16   Temp:   97.7 F (36.5 C) 98.5 F (36.9 C)  TempSrc:   Oral Axillary  SpO2: 100% 100% 100%   Weight:      Height:        Intake/Output Summary (Last 24 hours) at 05/24/2024 1034 Last data filed at 05/24/2024 0230 Gross  per 24 hour  Intake 300 ml  Output 2000 ml  Net -1700 ml   Filed Weights   05/22/24 0500 05/22/24 0857 05/23/24 1253  Weight: 80 kg 80.9 kg (P) 80.9 kg    Examination:  Physical Exam Vitals and nursing note reviewed.  HENT:     Head: Normocephalic and atraumatic.  Cardiovascular:     Rate and Rhythm: Normal rate and regular rhythm.  Pulmonary:     Effort: Pulmonary effort is normal.     Breath sounds: Normal breath sounds.  Skin:    General: Skin is warm and dry.     Capillary Refill: Capillary refill takes less than 2 seconds.   Neurological:     Mental Status: She is alert and oriented to person, place, and time.    Data Reviewed: I have personally reviewed following labs and imaging studies  CBC: Recent Labs  Lab 05/19/24 0757 05/20/24 0541 05/21/24 0545 05/23/24 1343 05/23/24 1352 05/23/24 1425 05/23/24 1541 05/23/24 1704  WBC 8.4 7.6 6.3  --   --   --   --   --   NEUTROABS 5.0  --   --   --   --   --   --   --   HGB 10.1* 10.1* 10.1* 10.9* 10.9* 10.9* 10.5* 10.9*  HCT 34.2* 32.9* 32.4* 32.0* 32.0* 32.0* 31.0* 32.0*  MCV 102.1* 96.8 96.7  --   --   --   --   --   PLT 173 182 157  --   --   --   --   --    Basic Metabolic Panel: Recent Labs  Lab 05/19/24 0757 05/19/24 1902 05/20/24 0541 05/21/24 0545 05/22/24 0444 05/23/24 1343 05/23/24 1352 05/23/24 1425 05/23/24 1541 05/23/24 1704  NA 129*  --  129* 131* 131* 124* 124* 125* 126* 125*  K 5.1  --  5.5* 4.6 4.7 6.5* 6.6* 5.7* 5.8* 6.3*  CL 96*  --  92* 96* 94* 96* 96* 96* 99 97*  CO2 18*  --  18* 22 21*  --   --   --   --   --   GLUCOSE 72  --  91 84 85 118* 117* 188* 58* 82  BUN 58*  --  74* 49* 67* 84* 85* 90* 87* 91*  CREATININE 9.67*  --  11.31* 9.24* 11.15* 13.70* 13.70* 13.80* 11.90* 13.50*  CALCIUM  8.8*  --  8.6* 8.9 8.9  --   --   --   --   --   MG  --  2.2  --   --   --   --   --   --   --   --   PHOS  --   --   --   --  8.0*  --   --   --   --   --    GFR: Estimated Creatinine Clearance: 6.7 mL/min (A) (by C-G formula based on SCr of 13.5 mg/dL (H)). Liver Function Tests: Recent Labs  Lab 05/19/24 0757 05/20/24 0541 05/22/24 0444  AST 24 18  --   ALT 16 13  --   ALKPHOS 80 80  --   BILITOT 1.1 0.9  --   PROT 7.2 7.0  --   ALBUMIN  3.0* 2.9* 2.7*   BNP (last 3 results) Recent Labs    01/11/24 0348 02/08/24 1451 05/14/24 0504  BNP >4,500.0* >4,500.0* >4,500.0*   CBG: Recent Labs  Lab 05/23/24 1612 05/23/24  1652  GLUCAP 41* 86   Radiology Studies: IR US  Guide Vasc Access Left Result Date:  05/23/2024 INDICATION: 28 year old female with end-stage renal disease requiring hemodialysis with malfunctioning and occluded left upper extremity arteriovenous graft. EXAM: IR ULTRASOUND GUIDANCE VASC ACCESS LEFT; CV CATH DECLOT W/THROMBOLYTIC DWELL COMPARISON:  None Available. MEDICATIONS: None. CONTRAST:  50 mL Omnipaque  300, intravenous ANESTHESIA/SEDATION: The procedure was performed under general anesthesia. FLUOROSCOPY TIME:  19.9 mGy reference air kerma COMPLICATIONS: None immediate. PROCEDURE: Informed written consent was obtained from the patient after a discussion of the risk, benefits and alternatives to treatment. Questions regarding the procedure were encouraged and answered. A timeout was performed prior to the initiation of the procedure. The left arm dialysis graft was prepped with Chlorhexidine  in a sterile fashion, and a sterile drape was applied covering the operative field. Preprocedure ultrasound evaluation demonstrated patency of the brachial artery with occlusion from the arterial anastomosis through the puncture zone in venous outflow of the visualized upper extremity graft. The procedure was planned. Subdermal Local anesthesia was administered at the planned needle entry sites for antegrade and retrograde access with 1% lidocaine . Under direct ultrasound visualization, a 21 gauge micropuncture needle was inserted into the peripheral aspect of the graft near the arterial anastomosis. A permanent ultrasound image was captured and stored in the record. A micropuncture wire was inserted over which the wire was exchanged for micropuncture sheath. A Wholey wire was then directed to the central veins in the micropuncture sheath was exchanged for a 6 French short vascular sheath. Over the wire, a Kumpe the catheter was directed to the level of the left innominate vein. The wire was removed and contrast was injected while retracting the catheter which demonstrated patency of the left innominate,  left subclavian, and left axillary veins. There was thrombus present in the indwelling central stent within the venous outflow of the graft near the axillary anastomosis. The catheter was readvanced and a total of 8 mg tPA was administered while retracting the catheter through the thrombus to the level of the puncture zone. During the tPA dwell time, ultrasound-guided retrograde access was then obtained with a 21 gauge micropuncture needle. A permanent ultrasound image was captured and stored in the record. A micropuncture wire was then placed over which a micropuncture sheath was inserted. A Wholey wire was then directed into the brachial artery in retrograde fashion over which a Kumpe the catheter was placed. Contrast injection demonstrated patency of the brachial artery with persistent occlusion of the arterial anastomosis. From this location, a total of 3 Fogarty balloon catheter passes were made to free the arterial anastomotic plug. The wire was removed. From the antegrade access, a 6 French cleaner device was advanced to the indwelling axillary anastomotic stent. The device was used into separate retraction pulls to the indwelling antegrade sheath. This was followed by balloon angioplasty with a 7 mm x 40 mm Athletis balloon in multiple stations extending from the indwelling axial anastomosis stent to the puncture zone. Balloon was removed. Through the antegrade sheath, completion fistulagram was performed which demonstrated brisk antegrade flow without significant filling defect through the puncture zone and venous outflow. The indwelling stent appeared widely patent. The central veins were widely patent. The sheath was removed and hemostasis obtained with application of a 0 Prolene pursestring suture which will be removed at the patient's next dialysis session. The indwelling left tunneled hemodialysis catheter was then prepped and draped in standard fashion. The retention sutures were removed. The catheter  was  then removed intact without necessity for blunt or sharp dissection. Sterile dressings were placed. The patient tolerated the procedure well without immediate postprocedural complication. IMPRESSION: 1. Technically successful pharmacomechanical declot of left upper extremity brachial basilic arteriovenous graft. 2. Technically successful removal of left IJ tunneled hemodialysis catheter. ACCESS: This access remains amenable to future percutaneous interventions as clinically indicated. Ester Sides, MD [facility] Radiology Electronically Signed   By: Ester Sides M.D.   On: 05/23/2024 22:30   IR AV DIALY SHUNT INTRO NEEDLE/INTRACATH INITIAL W/PTA/IMG LEFT Result Date: 05/23/2024 INDICATION: 28 year old female with end-stage renal disease requiring hemodialysis with malfunctioning and occluded left upper extremity arteriovenous graft. EXAM: IR ULTRASOUND GUIDANCE VASC ACCESS LEFT; CV CATH DECLOT W/THROMBOLYTIC DWELL COMPARISON:  None Available. MEDICATIONS: None. CONTRAST:  50 mL Omnipaque  300, intravenous ANESTHESIA/SEDATION: The procedure was performed under general anesthesia. FLUOROSCOPY TIME:  19.9 mGy reference air kerma COMPLICATIONS: None immediate. PROCEDURE: Informed written consent was obtained from the patient after a discussion of the risk, benefits and alternatives to treatment. Questions regarding the procedure were encouraged and answered. A timeout was performed prior to the initiation of the procedure. The left arm dialysis graft was prepped with Chlorhexidine  in a sterile fashion, and a sterile drape was applied covering the operative field. Preprocedure ultrasound evaluation demonstrated patency of the brachial artery with occlusion from the arterial anastomosis through the puncture zone in venous outflow of the visualized upper extremity graft. The procedure was planned. Subdermal Local anesthesia was administered at the planned needle  entry sites for antegrade and retrograde access with 1% lidocaine . Under direct ultrasound visualization, a 21 gauge micropuncture needle was inserted into the peripheral aspect of the graft near the arterial anastomosis. A permanent ultrasound image was captured and stored in the record. A micropuncture wire was inserted over which the wire was exchanged for micropuncture sheath. A Wholey wire was then directed to the central veins in the micropuncture sheath was exchanged for a 6 French short vascular sheath. Over the wire, a Kumpe the catheter was directed to the level of the left innominate vein. The wire was removed and contrast was injected while retracting the catheter which demonstrated patency of the left innominate, left subclavian, and left axillary veins. There was thrombus present in the indwelling central stent within the venous outflow of the graft near the axillary anastomosis. The catheter was readvanced and a total of 8 mg tPA was administered while retracting the catheter through the thrombus to the level of the puncture zone. During the tPA dwell time, ultrasound-guided retrograde access was then obtained with a 21 gauge micropuncture needle. A permanent ultrasound image was captured and stored in the record. A micropuncture wire was then placed over which a micropuncture sheath was inserted. A Wholey wire was then directed into the brachial artery in retrograde fashion over which a Kumpe the catheter was placed. Contrast injection demonstrated patency of the brachial artery with persistent occlusion of the arterial anastomosis. From this location, a total of 3 Fogarty balloon catheter passes were made to free the arterial anastomotic plug. The wire was removed. From the antegrade access, a 6 French cleaner device was advanced to the indwelling axillary anastomotic stent. The device was used into separate retraction pulls to the indwelling antegrade sheath. This was followed by balloon  angioplasty with a 7 mm x 40 mm Athletis balloon in multiple stations extending from the indwelling axial anastomosis stent to the puncture zone. Balloon was removed. Through the antegrade sheath,  completion fistulagram was performed which demonstrated brisk antegrade flow without significant filling defect through the puncture zone and venous outflow. The indwelling stent appeared widely patent. The central veins were widely patent. The sheath was removed and hemostasis obtained with application of a 0 Prolene pursestring suture which will be removed at the patient's next dialysis session. The indwelling left tunneled hemodialysis catheter was then prepped and draped in standard fashion. The retention sutures were removed. The catheter was then removed intact without necessity for blunt or sharp dissection. Sterile dressings were placed. The patient tolerated the procedure well without immediate postprocedural complication. IMPRESSION: 1. Technically successful pharmacomechanical declot of left upper extremity brachial basilic arteriovenous graft. 2. Technically successful removal of left IJ tunneled hemodialysis catheter. ACCESS: This access remains amenable to future percutaneous interventions as clinically indicated. Ester Sides, MD [facility] Radiology Electronically Signed   By: Ester Sides M.D.   On: 05/23/2024 22:30    Scheduled Meds:  acetaminophen   1,000 mg Oral TID   allopurinol   100 mg Oral Daily   amLODipine   10 mg Oral QHS   carvedilol   25 mg Oral BID WC   Chlorhexidine  Gluconate Cloth  6 each Topical Q0600   Chlorhexidine  Gluconate Cloth  6 each Topical Q0600   cloNIDine   0.1 mg Oral Daily   diphenhydrAMINE   50 mg Oral Once   doxazosin   2 mg Oral Daily   furosemide   160 mg Oral BID   heparin   5,000 Units Subcutaneous Q8H   irbesartan   300 mg Oral Daily   lidocaine -EPINEPHrine   20 mL Intradermal Once   sevelamer  carbonate  2.4 g Oral  Q breakfast   Continuous Infusions:   LOS: 3 days   Time spent: 55 minutes  Camellia Door, DO  [facility]  05/24/2024, 10:34 AM

## 2024-05-24 NOTE — Discharge Summary (Signed)
 [facility]  Hospitalist Physician Discharge Summary   Patient name: Linda [name]  Admit date:     05/19/2024  Discharge date: 05/25/2024  Attending Physician: JONEL LONNI SQUIBB [[id number]]  Discharge Physician: Camellia Door   PCP: [name], NP  Admitted From: Home  Disposition:  Home  Recommendations for Outpatient Follow-up:  Follow up with PCP in 1-2 weeks Follow up with routine dialysis on M,W,F at 7 AM at [facility] Health:No Equipment/Devices: None  Discharge Condition:Stable CODE STATUS:FULL Diet recommendation: Renal Fluid Restriction: 1200 ml/day  Hospital Summary: CC: left arm pain at fistula site HPI: Linda [name] is a 28 y.o. female with medical history significant of SLE, ESRD on hemodialysis, hypertension, HFrEF, rheumatoid arthritis presenting with vascular graft occlusion.  Patient with noted baseline history of ESRD on hemodialysis Monday Wednesday Friday.  Noted recent admission 02/2024 for issues including volume overload and respiratory failure requiring intubation. Patient states that she is compliant with HD. Patient states she is going on a regular course of time this week including an extra session on Tuesday for concern for volume overload.  Reports a roughly 20 pound weight gain within the past week or so that is well above baseline.  Patient states she noticed some mild left upper extremity pain redness and swelling over the past 3 days.  Patient went for dialysis today and facility was unable to draw blood from the graft area.  Reports worsening redness and swelling over the affected area.  Patient denies any prior episodes like this in the past.  Denies any recent trauma to the affected area.  Denies any prior episodes like this in the past.  Does still make a trace amount of urine on a regular basis in the setting of ESRD.  Reports compliance overall with hemodialysis as well as medication regimen.   Was unable to obtain dialysis today secondary to graft occlusion.  Denies any prior episodes like this in the past.  Smokes 1 black a mile per day.  Is followed by [facility] health rheumatology. Presented to the ER afebrile, blood pressures 140s to 160s over 100s.  Satting well on room air.  White count 8.4, hemoglobin 10.1, platelets 173, creatinine 9.67, platelets 258.  Bicarb 18.  Chest x-ray stable.  Vascular ultrasound duplex of dialysis catheter showing positive occlusion.  Interventional radiology consulted for further evaluation.  Significant Events: Admitted 05/19/2024 for occluded AVF   Admission Labs: Na 129, K 5.1, CO2 of 18, BUN 58, Scr 9.67, glu 72 WBC 8.4, HgB 10.1, plt 173 Mg 2.2  Admission Imaging Studies: CXR No active disease.   Significant Labs:   Significant Imaging Studies:   Antibiotic Therapy: Anti-infectives (From admission, onward)    Start     Dose/Rate Route Frequency Ordered Stop   05/19/24 1600  ceFAZolin  (ANCEF ) IVPB 2g/100 mL premix        over 30 Minutes Intravenous Continuous PRN 05/19/24 1607 05/19/24 1600   05/19/24 1400  ceFAZolin  (ANCEF ) IVPB 2g/100 mL premix  Status:  Discontinued        2 g 200 mL/hr over 30 Minutes Intravenous On call 05/19/24 1356 05/20/24 1400       Procedures: 05-19-2024 temporary dialysis catheter placement 09-23-205 left AVF declot, removal of temporary dialysis catheter  Consultants:    Hospital Course by Problem: * Vascular graft occlusion On admission 05-19-2024 Noted worsening left upper extremity redness pain and swelling associated with vascular graft No discernible thrill or  bruit on auscultation In discussion with Dr. Pamella, IR and vascular surgery have been consulted to further evaluate Upper extremity ultrasound pending Pain control Follow-up with vascular and IR recommendations in the interim Follow-up Prior to 05/24/24 had AVF declot on 05-23-2024 and her temporary dialysis catheter  removed  05-24-2024 stable for DC to home per nephrology.  05-25-2024. Pt refused to go home last night despite being told she was going to be discharged after HD yesterday evening. Pt is stable for discharge   Drug-seeking behavior Prior to 05/24/24 Given experience of hyperalgesia here, any consideration of daily opiate therapy as an outpatient should seriously weigh how this would exacerbate her hyperalgesia   05/24/24 pt claiming that po dilaudid  does not work and wants IV opiates. This request was denied. Her PDMP reviewed. She does not have any pain clinic provider Rx her opiates. She is only getting opiate Rx from ER and hospitalists. She will not be discharged on any opiates. Controlled Medication Caution Flag placed on pt's EMR.    Chronic HFrEF (heart failure with reduced ejection fraction) (HCC) On admission 05-19-2024 2D echo July 2025 with EF of 30 to 35% Appears fairly euvolemic to dry on presentation Noted dialysis dependent volume status though does make a small amount of urine Weight today 68 kg Monitor volume status closely with treatment Follow  Prior to 05/24/24 Presented with swelling and shortness of breath, due to missed dialysis.  Treated with dialysis. -Volume management by HD - Continue home carvedilol , Lasix , irbesartan   05/24/24 resolved acute on chronic systolic CHF. Present on admission.   Hyperkalemia On admission 05-19-2024 Unable to dialyze Monday due to malfunctioning TDC and clotted LUE fistula.  Today K up to 6.5 - HD tonight - [facility]  now  Prior to 05/24/24 resolved with HD.   ESRD (end stage renal disease) on dialysis [facility]) On admission 05-19-2024. Baseline ESRD on hemodialysis Monday Wednesday Friday Positive concurrent left upper extremity graft occlusion pending IR/vascular surgery intervention Had extra session of dialysis earlier this week per patient  Renal function appears near baseline Appears euvolemic  Consult  nephrology Monitor Prior to 05/24/24 had temporary HD catheter placed on 05-19-2024. Had HD on 05-20-2024, 05-23-2024  05/24/24 pt usually get outpatient HD at [facility] with 7 AM chair time. Discussed with Dr. Tobie with nephrology. He will order HD for today and pt can be discharged to home after HD. Pt will resume outpatient HD this coming Friday.    Tobacco abuse On admission 05-19-2024 smoker daily Discussed cessation at length Monitor  SLE (systemic lupus erythematosus related syndrome) (HCC) On admission 05-19-2024 Baseline discoid SLE Followed by [facility] No longer on thalidomide or plaquenil   Monitor  Prior to 05/24/24 Follows with [facility], Dr. Ziolkowska, not on disease modifying therapy at this time    Essential hypertension On admission 05-19-2024 Recalcitrant hypertension despite multiple medications Blood pressure 140s to 160s over 100s Continue home regimen including clonidine , Coreg , Norvasc  Titrate as appropriate Follow  Prior to 05/24/24 - Continue amlodipine , carvedilol , clonidine , furosemide , irbesartan , new doxazosin    05/24/24 remains on - Continue amlodipine , carvedilol , clonidine , furosemide , irbesartan , doxazosin       Discharge Diagnoses:  Principal Problem:   Vascular graft occlusion Active Problems:   Drug-seeking behavior   Essential hypertension   SLE (systemic lupus erythematosus related syndrome) (HCC)   Tobacco abuse   ESRD (end stage renal disease) on dialysis (HCC)   Hyperkalemia   Chronic HFrEF (heart failure with reduced ejection fraction) [facility])   Discharge Instructions  Discharge Instructions     (HEART FAILURE PATIENTS) Call MD:  Anytime you have any of the following symptoms: 1) 3 pound weight gain in 24 hours or 5 pounds in 1 week 2) shortness of breath, with or without a dry hacking cough 3) swelling in the hands, feet or stomach 4) if you have to sleep on extra pillows at night in order to breathe.   Complete by: As directed     Call MD for:  difficulty breathing, headache or visual disturbances   Complete by: As directed    Call MD for:  extreme fatigue   Complete by: As directed    Call MD for:  hives   Complete by: As directed    Call MD for:  persistant dizziness or light-headedness   Complete by: As directed    Call MD for:  persistant nausea and vomiting   Complete by: As directed    Call MD for:  redness, tenderness, or signs of infection (pain, swelling, redness, odor or green/yellow discharge around incision site)   Complete by: As directed    Call MD for:  severe uncontrolled pain   Complete by: As directed    Call MD for:  temperature >100.4   Complete by: As directed    Diet renal/carb modified with fluid restriction   Complete by: As directed    30 ounces per day fluid restriction   Discharge instructions   Complete by: As directed    1. Follow up with your primary care provider in 1-2 weeks following discharge from hospital. 2. Follow up with routine hemodialysis on Monday, Wednesday and Friday per your usual schedule at 7 AM. [facility]. [address], [place], [place] [address]   Increase activity slowly   Complete by: As directed    No wound care   Complete by: As directed       Allergies as of 05/25/2024       Reactions   Iodine  Hives, Shortness Of Breath   Lisinopril  Anaphylaxis   Angioedema   Gabapentin Other (See Comments)   Reaction: Tremor (intolerance); tremor   Ativan  [lorazepam ] Other (See Comments)   Somnolent with 1mg  ativan  at [facility] [place] Nov 2019. Patient stated it made her feel like she shut down and was mute and non-responding.   Hydroxychloroquine  Other (See Comments)   Pt reports making her skin peel really bad   Vicodin [hydrocodone -acetaminophen ] Itching, Rash        Medication List     STOP taking these medications    medroxyPROGESTERone  150 MG/ML injection Commonly known as: DEPO-PROVERA    ondansetron  4 MG disintegrating  tablet Commonly known as: ZOFRAN -ODT   oxyCODONE  5 MG immediate release tablet Commonly known as: Roxicodone        TAKE these medications    acetaminophen  325 MG tablet Commonly known as: TYLENOL  Take 2 tablets (650 mg total) by mouth every 6 (six) hours. What changed:  when to take this reasons to take this   allopurinol  100 MG tablet Commonly known as: ZYLOPRIM  Take 1 tablet (100 mg total) by mouth daily.   amLODipine  10 MG tablet Commonly known as: NORVASC  Take 1 tablet (10 mg total) by mouth at bedtime. What changed: how much to take   Blood Pressure Kit Uncontrolled blood pressure   Omron 3 Series BP Monitor Devi Use to check blood pressure   carvedilol  25 MG tablet Commonly known as: COREG  Take 1 tablet (25 mg total) by mouth 2 (two)  times daily with a meal.   cloNIDine  0.1 MG tablet Commonly known as: CATAPRES  Take 1 tablet (0.1 mg total) by mouth daily.   doxazosin  2 MG tablet Commonly known as: CARDURA  Take 1 tablet (2 mg total) by mouth daily.   furosemide  80 MG tablet Commonly known as: LASIX  Take 1 tablet (80 mg total) by mouth daily. What changed:  how much to take when to take this   irbesartan  300 MG tablet Commonly known as: AVAPRO  Take 1 tablet (300 mg total) by mouth daily.   lidocaine -prilocaine  cream Commonly known as: EMLA  Apply 1 Application topically daily as needed (for dialysis).   magic mouthwash (lidocaine , diphenhydrAMINE , alum & mag hydroxide) suspension Swish and spit 5 mLs 4 (four) times daily as needed for mouth pain.   nitroGLYCERIN  0.4 MG SL tablet Commonly known as: NITROSTAT  Place 1 tablet (0.4 mg total) under the tongue every 5 (five) minutes as needed for chest pain.   ondansetron  4 MG tablet Commonly known as: ZOFRAN  Take 1 tablet (4 mg total) by mouth every 6 (six) hours.   sevelamer  carbonate 2.4 g Pack Commonly known as: RENVELA  Take 2.4 g by mouth daily.   tiZANidine  4 MG tablet Commonly known as:  ZANAFLEX  Take 1 tablet (4 mg total) by mouth daily as needed for muscle spasms.        Allergies  Allergen Reactions   Iodine  Hives and Shortness Of Breath   Lisinopril  Anaphylaxis    Angioedema   Gabapentin Other (See Comments)    Reaction: Tremor (intolerance); tremor   Ativan  [Lorazepam ] Other (See Comments)    Somnolent with 1mg  ativan  at [facility] Nov 2019. Patient stated it made her feel like she shut down and was mute and non-responding.   Hydroxychloroquine  Other (See Comments)    Pt reports making her skin peel really bad   Vicodin [Hydrocodone -Acetaminophen ] Itching and Rash    Discharge Exam: Vitals:   05/25/24 0812 05/25/24 0838  BP: (!) 145/127 (!) 145/127  Pulse: 70   Resp: 18   Temp: 97.7 F (36.5 C)   SpO2: 99%     Physical Exam Vitals and nursing note reviewed.  HENT:     Head: Normocephalic and atraumatic.  Cardiovascular:     Rate and Rhythm: Normal rate and regular rhythm.  Pulmonary:     Effort: Pulmonary effort is normal.     Breath sounds: Normal breath sounds.  Skin:    General: Skin is warm and dry.     Capillary Refill: Capillary refill takes less than 2 seconds.  Neurological:     Mental Status: She is alert and oriented to person, place, and time.     The results of significant diagnostics from this hospitalization (including imaging, microbiology, ancillary and laboratory) are listed below for reference.    Microbiology: No results found for this or any previous visit (from the past 240 hours).   Labs: BNP (last 3 results) Recent Labs    01/11/24 0348 02/08/24 1451 05/14/24 0504  BNP >4,500.0* >4,500.0* >4,500.0*   Basic Metabolic Panel: Recent Labs  Lab 05/19/24 0757 05/19/24 1902 05/20/24 0541 05/21/24 0545 05/22/24 0444 05/23/24 1343 05/23/24 1352 05/23/24 1425 05/23/24 1541 05/23/24 1704  NA 129*  --  129* 131* 131* 124* 124* 125* 126* 125*  K 5.1  --  5.5* 4.6 4.7 6.5* 6.6* 5.7* 5.8* 6.3*  CL 96*  --   92* 96* 94* 96* 96* 96* 99 97*  CO2 18*  --  18* 22 21*  --   --   --   --   --   GLUCOSE 72  --  91 84 85 118* 117* 188* 58* 82  BUN 58*  --  74* 49* 67* 84* 85* 90* 87* 91*  CREATININE 9.67*  --  11.31* 9.24* 11.15* 13.70* 13.70* 13.80* 11.90* 13.50*  CALCIUM  8.8*  --  8.6* 8.9 8.9  --   --   --   --   --   MG  --  2.2  --   --   --   --   --   --   --   --   PHOS  --   --   --   --  8.0*  --   --   --   --   --    Liver Function Tests: Recent Labs  Lab 05/19/24 0757 05/20/24 0541 05/22/24 0444  AST 24 18  --   ALT 16 13  --   ALKPHOS 80 80  --   BILITOT 1.1 0.9  --   PROT 7.2 7.0  --   ALBUMIN  3.0* 2.9* 2.7*   CBC: Recent Labs  Lab 05/19/24 0757 05/20/24 0541 05/21/24 0545 05/23/24 1343 05/23/24 1352 05/23/24 1425 05/23/24 1541 05/23/24 1704  WBC 8.4 7.6 6.3  --   --   --   --   --   NEUTROABS 5.0  --   --   --   --   --   --   --   HGB 10.1* 10.1* 10.1* 10.9* 10.9* 10.9* 10.5* 10.9*  HCT 34.2* 32.9* 32.4* 32.0* 32.0* 32.0* 31.0* 32.0*  MCV 102.1* 96.8 96.7  --   --   --   --   --   PLT 173 182 157  --   --   --   --   --    CBG: Recent Labs  Lab 05/23/24 1612 05/23/24 1652  GLUCAP 41* 86     Sepsis Labs Recent Labs  Lab 05/19/24 0757 05/20/24 0541 05/21/24 0545  WBC 8.4 7.6 6.3    Procedures/Studies: IR US  Guide Vasc Access Left Result Date: 05/23/2024 INDICATION: 28 year old female with end-stage renal disease requiring hemodialysis with malfunctioning and occluded left upper extremity arteriovenous graft. EXAM: IR ULTRASOUND GUIDANCE VASC ACCESS LEFT; CV CATH DECLOT W/THROMBOLYTIC DWELL COMPARISON:  None Available. MEDICATIONS: None. CONTRAST:  50 mL Omnipaque  300, intravenous ANESTHESIA/SEDATION: The procedure was performed under general anesthesia. FLUOROSCOPY TIME:  19.9 mGy reference air kerma COMPLICATIONS: None immediate. PROCEDURE: Informed written consent was obtained from the patient after a discussion of the risk, benefits and alternatives  to treatment. Questions regarding the procedure were encouraged and answered. A timeout was performed prior to the initiation of the procedure. The left arm dialysis graft was prepped with Chlorhexidine  in a sterile fashion, and a sterile drape was applied covering the operative field. Preprocedure ultrasound evaluation demonstrated patency of the brachial artery with occlusion from the arterial anastomosis through the puncture zone in venous outflow of the visualized upper extremity graft. The procedure was planned. Subdermal Local anesthesia was administered at the planned needle entry sites for antegrade and retrograde access with 1% lidocaine . Under direct ultrasound visualization, a 21 gauge micropuncture needle was inserted into the peripheral aspect of the graft near the arterial anastomosis. A permanent ultrasound image was captured and stored in the record. A micropuncture wire was inserted over which the wire was exchanged for micropuncture sheath. A Wholey  wire was then directed to the central veins in the micropuncture sheath was exchanged for a 6 French short vascular sheath. Over the wire, a Kumpe the catheter was directed to the level of the left innominate vein. The wire was removed and contrast was injected while retracting the catheter which demonstrated patency of the left innominate, left subclavian, and left axillary veins. There was thrombus present in the indwelling central stent within the venous outflow of the graft near the axillary anastomosis. The catheter was readvanced and a total of 8 mg tPA was administered while retracting the catheter through the thrombus to the level of the puncture zone. During the tPA dwell time, ultrasound-guided retrograde access was then obtained with a 21 gauge micropuncture needle. A permanent ultrasound image was captured and stored in the record. A micropuncture wire was then placed over which a micropuncture sheath was inserted. A Wholey wire was then  directed into the brachial artery in retrograde fashion over which a Kumpe the catheter was placed. Contrast injection demonstrated patency of the brachial artery with persistent occlusion of the arterial anastomosis. From this location, a total of 3 Fogarty balloon catheter passes were made to free the arterial anastomotic plug. The wire was removed. From the antegrade access, a 6 French cleaner device was advanced to the indwelling axillary anastomotic stent. The device was used into separate retraction pulls to the indwelling antegrade sheath. This was followed by balloon angioplasty with a 7 mm x 40 mm Athletis balloon in multiple stations extending from the indwelling axial anastomosis stent to the puncture zone. Balloon was removed. Through the antegrade sheath, completion fistulagram was performed which demonstrated brisk antegrade flow without significant filling defect through the puncture zone and venous outflow. The indwelling stent appeared widely patent. The central veins were widely patent. The sheath was removed and hemostasis obtained with application of a 0 Prolene pursestring suture which will be removed at the patient's next dialysis session. The indwelling left tunneled hemodialysis catheter was then prepped and draped in standard fashion. The retention sutures were removed. The catheter was then removed intact without necessity for blunt or sharp dissection. Sterile dressings were placed. The patient tolerated the procedure well without immediate postprocedural complication. IMPRESSION: 1. Technically successful pharmacomechanical declot of left upper extremity brachial basilic arteriovenous graft. 2. Technically successful removal of left IJ tunneled hemodialysis catheter. ACCESS: This access remains amenable to future percutaneous interventions as clinically indicated. Ester Sides, MD [facility] Radiology Electronically Signed   By: Ester Sides M.D.   On: 05/23/2024 22:30   IR AV DIALY SHUNT INTRO NEEDLE/INTRACATH INITIAL W/PTA/IMG LEFT Result Date: 05/23/2024 INDICATION: 28 year old female with end-stage renal disease requiring hemodialysis with malfunctioning and occluded left upper extremity arteriovenous graft. EXAM: IR ULTRASOUND GUIDANCE VASC ACCESS LEFT; CV CATH DECLOT W/THROMBOLYTIC DWELL COMPARISON:  None Available. MEDICATIONS: None. CONTRAST:  50 mL Omnipaque  300, intravenous ANESTHESIA/SEDATION: The procedure was performed under general anesthesia. FLUOROSCOPY TIME:  19.9 mGy reference air kerma COMPLICATIONS: None immediate. PROCEDURE: Informed written consent was obtained from the patient after a discussion of the risk, benefits and alternatives to treatment. Questions regarding the procedure were encouraged and answered. A timeout was performed prior to the initiation of the procedure. The left arm dialysis graft was prepped with Chlorhexidine  in a sterile fashion, and a sterile drape was applied covering the operative field. Preprocedure ultrasound evaluation demonstrated patency of the brachial artery with occlusion from the arterial anastomosis through the puncture zone in venous  outflow of the visualized upper extremity graft. The procedure was planned. Subdermal Local anesthesia was administered at the planned needle entry sites for antegrade and retrograde access with 1% lidocaine . Under direct ultrasound visualization, a 21 gauge micropuncture needle was inserted into the peripheral aspect of the graft near the arterial anastomosis. A permanent ultrasound image was captured and stored in the record. A micropuncture wire was inserted over which the wire was exchanged for micropuncture sheath. A Wholey wire was then directed to the central veins in the micropuncture sheath was exchanged for a 6 French short vascular sheath. Over the wire, a Kumpe the catheter was directed to the level of the left innominate vein. The wire was  removed and contrast was injected while retracting the catheter which demonstrated patency of the left innominate, left subclavian, and left axillary veins. There was thrombus present in the indwelling central stent within the venous outflow of the graft near the axillary anastomosis. The catheter was readvanced and a total of 8 mg tPA was administered while retracting the catheter through the thrombus to the level of the puncture zone. During the tPA dwell time, ultrasound-guided retrograde access was then obtained with a 21 gauge micropuncture needle. A permanent ultrasound image was captured and stored in the record. A micropuncture wire was then placed over which a micropuncture sheath was inserted. A Wholey wire was then directed into the brachial artery in retrograde fashion over which a Kumpe the catheter was placed. Contrast injection demonstrated patency of the brachial artery with persistent occlusion of the arterial anastomosis. From this location, a total of 3 Fogarty balloon catheter passes were made to free the arterial anastomotic plug. The wire was removed. From the antegrade access, a 6 French cleaner device was advanced to the indwelling axillary anastomotic stent. The device was used into separate retraction pulls to the indwelling antegrade sheath. This was followed by balloon angioplasty with a 7 mm x 40 mm Athletis balloon in multiple stations extending from the indwelling axial anastomosis stent to the puncture zone. Balloon was removed. Through the antegrade sheath, completion fistulagram was performed which demonstrated brisk antegrade flow without significant filling defect through the puncture zone and venous outflow. The indwelling stent appeared widely patent. The central veins were widely patent. The sheath was removed and hemostasis obtained with application of a 0 Prolene pursestring suture which will be removed at the patient's next dialysis session. The indwelling left tunneled  hemodialysis catheter was then prepped and draped in standard fashion. The retention sutures were removed. The catheter was then removed intact without necessity for blunt or sharp dissection. Sterile dressings were placed. The patient tolerated the procedure well without immediate postprocedural complication. IMPRESSION: 1. Technically successful pharmacomechanical declot of left upper extremity brachial basilic arteriovenous graft. 2. Technically successful removal of left IJ tunneled hemodialysis catheter. ACCESS: This access remains amenable to future percutaneous interventions as clinically indicated. Ester Sides, MD [facility] Radiology Electronically Signed   By: Ester Sides M.D.   On: 05/23/2024 22:30   IR TUNNELED CENTRAL VENOUS CATH [facility] IMG Addendum Date: 05/19/2024 ADDENDUM REPORT: 05/19/2024 17:26 ADDENDUM: Under exam the exam is a placement of a tunneled hemodialysis catheter with ultrasound and fluoroscopic guidance Electronically Signed   By: Cordella Banner   On: 05/19/2024 17:26   Result Date: 05/19/2024 INDICATION: Infiltrated left arm AV fistula causing exquisite pain in his now unusable for hemodialysis. Planned placement of a left IJ hemodialysis catheter as the right internal  jugular vein is occluded. EXAM: TUNNELED PICC LINE WITH ULTRASOUND AND FLUOROSCOPIC GUIDANCE MEDICATIONS: 2 g Ancef . The antibiotic was given in an appropriate time interval prior to skin puncture. ANESTHESIA/SEDATION: Moderate (conscious) sedation was employed during this procedure. A total of Versed  4 mg and Fentanyl  200 mcg was administered intravenously by the radiology nurse. Total intra-service moderate Sedation Time: 23 minutes. The patient's level of consciousness and vital signs were monitored continuously by radiology nursing throughout the procedure under my direct supervision. FLUOROSCOPY: Radiation Exposure Index (as provided by the fluoroscopic  device): 49 mGy Kerma COMPLICATIONS: None immediate. PROCEDURE: Informed written consent was obtained from the patient after a discussion of the risks, benefits, and alternatives to treatment. Questions regarding the procedure were encouraged and answered. The right neck and chest were prepped with chlorhexidine  in a sterile fashion, and a sterile drape was applied covering the operative field. Maximum barrier sterile technique with sterile gowns and gloves were used for the procedure. A timeout was performed prior to the initiation of the procedure. After creating a small venotomy incision, a micropuncture kit was utilized to access the left internal jugular vein under direct, real-time ultrasound guidance after the overlying soft tissues were anesthetized with 1% lidocaine  with epinephrine . Ultrasound image documentation was performed. The microwire was kinked to measure appropriate catheter length. The micropuncture sheath was exchanged for a peel-away sheath over a guidewire. A 15 French tunneled hemodialysis catheter measuring 28 cm was prepped at the table. The catheter was then placed through the peel-away sheath with tip ultimately positioned at the superior caval-atrial junction. Final catheter positioning was confirmed and documented with a spot radiographic image. The catheter aspirates and flushes normally. The catheter was flushed with appropriate volume heparin  dwells. The catheter exit site was secured with a 0-Prolene retention suture. The venotomy incision was closed with an interrupted 4-0 Vicryl, Dermabond and Steri-strips. Dressings were applied. The patient tolerated the procedure well without immediate post procedural complication. FINDINGS: After catheter placement, the tip lies within the superior cavoatrial junction. The catheter aspirates and flushes normally and is ready for immediate use. IMPRESSION: Successful placement 28 cm left-sided hemodialysis tunneled catheter. Electronically  Signed: By: Cordella Banner On: 05/19/2024 17:21   VAS US  DUPLEX DIALYSIS ACCESS (AVF, AVG) Result Date: 05/19/2024 DIALYSIS ACCESS Patient Name:  SHAQUASIA CAPONIGRO Ranum  Date of Exam:   05/19/2024 Medical Rec #: [id number]              Accession #:    [id number] Date of Birth: March 22, 1996              Patient Gender: F Patient Age:   35 years Exam Location:  [facility] Procedure:      VAS US  DUPLEX DIALYSIS ACCESS (AVF, AVG) Referring Phys: MICHAEL PENNA --------------------------------------------------------------------------------  Reason for Exam: No palpable thrill for AVF/AVG. Access Site: Left Upper Extremity. Access Type: Brachial-Axillary. History: Stent in axilla. Pain during dialysis session on Wednesday after nurse          stuck fistula lower than normal. Patient halted session. Presents with          increased pain and swelling of left upper extremity and diminished          thrill. Limitations: Pain with touch and swelling Comparison Study: No prior study Performing Technologist: Alberta Lis RVS  Examination Guidelines: A complete evaluation includes B-mode imaging, spectral Doppler, color Doppler, and power Doppler as needed of all accessible portions of each vessel. Unilateral testing is considered  an integral part of a complete examination. Limited examinations for reoccurring indications may be performed as noted.     Summary: Stent within upper arm appears occluded. Minimal flow noted throughout left upper extremity fistula.   Diagnosing physician: Debby Robertson Electronically signed by Debby Robertson on 05/19/2024 at 3:40:32 PM.    --------------------------------------------------------------------------------   Final    DG Chest Portable 1 View Result Date: 05/19/2024 CLINICAL DATA:  Left upper extremity pain and edema. History of dialysis. EXAM: PORTABLE CHEST 1 VIEW COMPARISON:  May 14, 2024. FINDINGS: Stable cardiomegaly. Both lungs are clear. The visualized skeletal  structures are unremarkable. IMPRESSION: No active disease. Electronically Signed   By: Lynwood Landy Raddle M.D.   On: 05/19/2024 08:06   DG Chest 2 View Result Date: 05/14/2024 EXAM: 2 VIEW(S) XRAY OF THE CHEST 05/14/2024 05:25:32 AM COMPARISON: 04/07/2024 CLINICAL HISTORY: Pt in with reported sob and cough that began last night. Dialysis pt, last session Friday. Also reporting some L back pain. FINDINGS: LUNGS AND PLEURA: Pulmonary edema, mild. No focal pulmonary opacity. No pleural effusion. No pneumothorax. HEART AND MEDIASTINUM: Cardiomegaly. No acute abnormality of the cardiac and mediastinal silhouettes. BONES AND SOFT TISSUES: No acute osseous abnormality. Vascular stent in left upper arm. IMPRESSION: 1. Cardiomegaly. 2. Mild pulmonary edema. Electronically signed by: Waddell Calk MD 05/14/2024 05:59 AM EDT RP Workstation: [id number]   CT ABDOMEN PELVIS WO CONTRAST Result Date: 04/28/2024 EXAM: CT ABDOMEN AND PELVIS WITHOUT CONTRAST 04/28/2024 05:58:43 AM TECHNIQUE: CT of the abdomen and pelvis was performed without the administration of intravenous contrast. Multiplanar reformatted images are provided for review. Automated exposure control, iterative reconstruction, and/or weight-based adjustment of the mA/kV was utilized to reduce the radiation dose to as low as reasonably achievable. COMPARISON: 04/24/2024 CLINICAL HISTORY: Abdominal/flank pain, stone suspected. Back pain. FINDINGS: LOWER CHEST: Mild atelectasis noted within the lung bases. No pleural fluid or consolidative change. LIVER: Normal size and contour. GALLBLADDER AND BILE DUCTS: No wall thickening. No cholelithiasis. No biliary ductal dilatation. SPLEEN: Normal size. No focal lesion. PANCREAS: No mass. No ductal dilatation. ADRENAL GLANDS: Normal appearance. No mass. KIDNEYS, URETERS AND BLADDER: No stones in the kidneys or ureters. No hydronephrosis. No perinephric or periureteral stranding. Urinary bladder is unremarkable. GI AND BOWEL:  The appendix is visualized and appears normal. Mild retained stool within the ascending colon and rectum. There is no bowel obstruction. No bowel wall thickening. PERITONEUM AND RETROPERITONEUM: No ascites. No free air. VASCULATURE: Age advanced aortic atherosclerotic calcification. LYMPH NODES: No lymphadenopathy. REPRODUCTIVE ORGANS: Uterus and adnexal structures appear normal. BONES AND SOFT TISSUES: Unchanged bilateral L5 pars defects with minimal, grade 1 anterolisthesis of L5 on S1. No acute osseous finding. Extensive soft tissue calcifications identified in the posterior gluteal regions and lower back. IMPRESSION: 1. No acute findings in the abdomen or pelvis. 2. Cardiomegaly and age-advanced aortic atherosclerotic calcification. Electronically signed by: Waddell Calk MD 04/28/2024 06:31 AM EDT RP Workstation: [id number]    Time coordinating discharge: 55 mins  SIGNED:  Camellia Door, DO [facility] 05/25/24, 9:52 AM

## 2024-05-24 NOTE — Subjective & Objective (Signed)
 Pt seen and examined. Discussed with nephrology. Pt can be discharged today but pt insisting that she gets another HD session today. Pt confirms 7 AM chair time on M, W, F as outpatient HD at [facility].

## 2024-05-24 NOTE — Plan of Care (Signed)
 Patient calm and cooperative A&O X4. Patient was transported to dialysis this shift. Patient expressed anxiety about dialysis, nurse went to dialysis and gave therapuetic language to calm patient and medicate for dialysis nurse. Patient completed dialysis. Patient arrived back to 2 [place] and deaccessed catheter site on right chest with pressure dressing saturated. Patient removed dressing Butler RN and [employer] RN replaced pressure dressing and educated patient on pressure dressing significance. Patient left with call bell in reach and bed in lowest position.  Problem: Education: Goal: Knowledge of General Education information will improve Description: Including pain rating scale, medication(s)/side effects and non-pharmacologic comfort measures Outcome: Progressing   Problem: Health Behavior/Discharge Planning: Goal: Ability to manage health-related needs will improve Outcome: Progressing   Problem: Clinical Measurements: Goal: Ability to maintain clinical measurements within normal limits will improve Outcome: Progressing   Problem: Activity: Goal: Risk for activity intolerance will decrease Outcome: Progressing   Problem: Nutrition: Goal: Adequate nutrition will be maintained Outcome: Progressing   Problem: Coping: Goal: Level of anxiety will decrease Outcome: Progressing   Problem: Pain Managment: Goal: General experience of comfort will improve and/or be controlled Outcome: Progressing   Problem: Safety: Goal: Ability to remain free from injury will improve Outcome: Progressing   Problem: Skin Integrity: Goal: Risk for impaired skin integrity will decrease Outcome: Progressing

## 2024-05-24 NOTE — Progress Notes (Signed)
   05/23/24 2245  Pain Assessment  Pain Scale 0-10  Pain Score 5  Fistula / Graft Left Upper arm  No placement date or time found.   Orientation: Left  Access Location: Upper arm  Site Condition No complications  Fistula / Graft Assessment Present;Thrill;Bruit  Neurological  Level of Consciousness Alert  Orientation Level Oriented X4  Respiratory  Respiratory Pattern Regular  Chest Assessment Chest expansion symmetrical  Bilateral Breath Sounds Clear  R Upper  Breath Sounds Clear  Cardiac  Pulse Regular  Heart Sounds S1, S2  Jugular Venous Distention (JVD) No  Psychosocial  Psychosocial (WDL) WDL   Left chest dressing changed due to bleeding. Surgifoam and gauze applied.

## 2024-05-24 NOTE — Progress Notes (Signed)
 Patient ID: Linda [name], female   DOB: [birth year], 28 y.o.   MRN: [id number] [facility] Progress Note   Assessment/ Plan:   1.  Thrombosed left upper arm AV graft: She had successful thrombectomy of her left upper arm AV graft by interventional radiology along with removal of left IJ tunneled hemodialysis catheter (with successful use of graft overnight for dialysis). 2. ESRD: Underwent hemodialysis overnight after successful thrombectomy of left upper arm AV graft and removal of left IJ TDC.  She may plausibly be able to discharge home during the course of the day having had dialysis overnight/earlier this morning and resume dialysis at her outpatient kidney center on 9/26. 3. Anemia: Hemoglobin and hematocrit within acceptable range, will continue to monitor trend to decide on need to restart ESA. 4. CKD-MBD: Significantly elevated phosphorus level with calcium  level at goal.  Continue sevelamer  2.4 g 3 times daily AC. 5. Nutrition: Continue renal diet with fluid restriction and ongoing protein supplementation. 6. Hypertension: Blood pressures remain elevated on current therapy with amlodipine , clonidine , carvedilol , irbesartan  and recently added doxazosin .  Continue to titrate antihypertensive therapy as an outpatient and monitor with UF/HD Subjective:   Reports to be feeling tired and sleepy this morning after dialysis overnight.   Objective:   BP (!) 170/123 (BP Location: Right Arm)   Pulse 86   Temp 97.7 F (36.5 C) (Oral)   Resp 14   Ht (P) 5' 6 (1.676 m)   Wt (P) 80.9 kg   SpO2 100%   BMI (P) 28.79 kg/m   Physical Exam: Gen: Sleeping soundly on her right side, awakens to touch CVS: Pulse regular rhythm, normal rate, S1 and S2 normal Resp: Diminished breath sounds over bases.  No rales/rhonchi.  Intact dressing left chest over tunnel exit site Abd: Soft, flat, nontender, bowel sounds normal Ext: 1+ bilateral lower extremity edema.  Intact dressings left  arm AV graft with palpable thrill  Labs: BMET Recent Labs  Lab 05/19/24 0757 05/20/24 0541 05/21/24 0545 05/22/24 0444 05/23/24 1343 05/23/24 1352 05/23/24 1425 05/23/24 1541 05/23/24 1704  NA 129* 129* 131* 131* 124* 124* 125* 126* 125*  K 5.1 5.5* 4.6 4.7 6.5* 6.6* 5.7* 5.8* 6.3*  CL 96* 92* 96* 94* 96* 96* 96* 99 97*  CO2 18* 18* 22 21*  --   --   --   --   --   GLUCOSE 72 91 84 85 118* 117* 188* 58* 82  BUN 58* 74* 49* 67* 84* 85* 90* 87* 91*  CREATININE 9.67* 11.31* 9.24* 11.15* 13.70* 13.70* 13.80* 11.90* 13.50*  CALCIUM  8.8* 8.6* 8.9 8.9  --   --   --   --   --   PHOS  --   --   --  8.0*  --   --   --   --   --    CBC Recent Labs  Lab 05/19/24 0757 05/20/24 0541 05/21/24 0545 05/23/24 1343 05/23/24 1352 05/23/24 1425 05/23/24 1541 05/23/24 1704  WBC 8.4 7.6 6.3  --   --   --   --   --   NEUTROABS 5.0  --   --   --   --   --   --   --   HGB 10.1* 10.1* 10.1*   < > 10.9* 10.9* 10.5* 10.9*  HCT 34.2* 32.9* 32.4*   < > 32.0* 32.0* 31.0* 32.0*  MCV 102.1* 96.8 96.7  --   --   --   --   --  PLT 173 182 157  --   --   --   --   --    < > = values in this interval not displayed.    Medications:     acetaminophen   1,000 mg Oral TID   allopurinol   100 mg Oral Daily   amLODipine   10 mg Oral QHS   carvedilol   25 mg Oral BID WC   Chlorhexidine  Gluconate Cloth  6 each Topical Q0600   Chlorhexidine  Gluconate Cloth  6 each Topical Q0600   cloNIDine   0.1 mg Oral Daily   diphenhydrAMINE   50 mg Oral Once   doxazosin   2 mg Oral Daily   furosemide   160 mg Oral BID   heparin   5,000 Units Subcutaneous Q8H   irbesartan   300 mg Oral Daily   lidocaine -EPINEPHrine   20 mL Intradermal Once   sevelamer  carbonate  2.4 g Oral Q breakfast   Gordy Blanch, MD 05/24/2024, 8:10 AM

## 2024-05-24 NOTE — Assessment & Plan Note (Signed)
 On admission 05-19-2024 Unable to dialyze Monday due to malfunctioning TDC and clotted LUE fistula.  Today K up to 6.5 - HD tonight - [facility]  now  Prior to 05/24/24 resolved with HD.

## 2024-05-24 NOTE — Assessment & Plan Note (Signed)
 Prior to 05/24/24 Given experience of hyperalgesia here, any consideration of daily opiate therapy as an outpatient should seriously weigh how this would exacerbate her hyperalgesia   05/24/24 pt claiming that po dilaudid  does not work and wants IV opiates. This request was denied. Her PDMP reviewed. She does not have any pain clinic provider Rx her opiates. She is only getting opiate Rx from ER and hospitalists. She will not be discharged on any opiates. Controlled Medication Caution Flag placed on pt's EMR.

## 2024-05-24 NOTE — Progress Notes (Signed)
  Received patient in bed to unit.   Informed consent signed and in chart.    TX duration: 3.25     Transported by  Hand-off given to patient's nurse.    Access used: rt AVF Access issues: had to recalculate venous line   Total UF removed: 2200 Medication(s) given: none Post HD VS: 162/117      Olivia Hurst LPN [facility]

## 2024-05-24 NOTE — Care Management Important Message (Signed)
 Important Message  Patient Details  Name: Linda [name] MRN: [id number] Date of Birth: 1996-05-12   Important Message Given:  Yes - Medicare IM     Claretta Deed 05/24/2024, 1:57 PM

## 2024-05-25 ENCOUNTER — Other Ambulatory Visit (HOSPITAL_COMMUNITY): Payer: Self-pay

## 2024-05-25 MED ORDER — LIDOCAINE VISCOUS HCL 2 % MT SOLN: 5.0000 mL | Freq: Four times a day (QID) | OROMUCOSAL | 0 refills | Status: DC | PRN

## 2024-05-25 NOTE — Progress Notes (Signed)
 Reviewed AVS, patient expressed understanding of medications, MD follow up reviewed.   Removed IV, Site clean, dry and intact.  See LDA for information on wounds at discharge. Patient states all belongings brought to the hospital at time of admission are accounted for and packed to take home.  Patient informed and expressed understanding where to pick up discharge medications.  Lead Transport contacted to transport patient to Discharge lounge to wait for transportation home.

## 2024-05-25 NOTE — Progress Notes (Signed)
 Patient ID: Linda [name], female   DOB: June 20, 1996, 28 y.o.   MRN: [id number] [facility] Progress Note   Assessment/ Plan:   1.  Thrombosed left upper arm AV graft: She had successful thrombectomy of her left upper arm AV graft by interventional radiology along with removal of left IJ tunneled hemodialysis catheter.  Graft used successfully for dialysis twice since then including yesterday. 2. ESRD: Underwent hemodialysis overnight after successful thrombectomy of left upper arm AV graft and removal of left IJ TDC.  Underwent additional hemodialysis yesterday for volume removal and plans in place for discharge home today to resume outpatient dialysis tomorrow. 3. Anemia: Hemoglobin and hematocrit within acceptable range, will continue to monitor trend to decide on need to restart ESA. 4. CKD-MBD: Significantly elevated phosphorus level with calcium  level at goal.  Continue sevelamer  2.4 g 3 times daily AC. 5. Nutrition: Continue renal diet with fluid restriction and ongoing protein supplementation. 6. Hypertension: Blood pressures remain elevated on current therapy with amlodipine , clonidine , carvedilol , irbesartan  and recently added doxazosin .  Continue to titrate antihypertensive therapy as an outpatient and monitor with UF/HD Subjective:   Asking for Magic mouthwash upon discharge because she felt that she bit her tongue at some point in her hospitalization   Objective:   BP (!) 161/119 (BP Location: Right Arm)   Pulse 86   Temp 97.8 F (36.6 C) (Oral)   Resp 16   Ht (P) 5' 6 (1.676 m)   Wt (P) 80.9 kg   SpO2 100%   BMI (P) 28.79 kg/m   Physical Exam: Gen: Resting comfortably in bed, awakens to voice, alert/oriented CVS: Pulse regular rhythm, normal rate, S1 and S2 normal Resp: Poor inspiratory effort with decreased breath sounds over bases otherwise clear to auscultation, no rales/rhonchi Abd: Soft, flat, nontender, bowel sounds normal Ext: 1+ bilateral  lower extremity edema.  Intact dressings left arm AV graft with palpable thrill  Labs: BMET Recent Labs  Lab 05/19/24 0757 05/20/24 0541 05/21/24 0545 05/22/24 0444 05/23/24 1343 05/23/24 1352 05/23/24 1425 05/23/24 1541 05/23/24 1704  NA 129* 129* 131* 131* 124* 124* 125* 126* 125*  K 5.1 5.5* 4.6 4.7 6.5* 6.6* 5.7* 5.8* 6.3*  CL 96* 92* 96* 94* 96* 96* 96* 99 97*  CO2 18* 18* 22 21*  --   --   --   --   --   GLUCOSE 72 91 84 85 118* 117* 188* 58* 82  BUN 58* 74* 49* 67* 84* 85* 90* 87* 91*  CREATININE 9.67* 11.31* 9.24* 11.15* 13.70* 13.70* 13.80* 11.90* 13.50*  CALCIUM  8.8* 8.6* 8.9 8.9  --   --   --   --   --   PHOS  --   --   --  8.0*  --   --   --   --   --    CBC Recent Labs  Lab 05/19/24 0757 05/20/24 0541 05/21/24 0545 05/23/24 1343 05/23/24 1352 05/23/24 1425 05/23/24 1541 05/23/24 1704  WBC 8.4 7.6 6.3  --   --   --   --   --   NEUTROABS 5.0  --   --   --   --   --   --   --   HGB 10.1* 10.1* 10.1*   < > 10.9* 10.9* 10.5* 10.9*  HCT 34.2* 32.9* 32.4*   < > 32.0* 32.0* 31.0* 32.0*  MCV 102.1* 96.8 96.7  --   --   --   --   --  PLT 173 182 157  --   --   --   --   --    < > = values in this interval not displayed.    Medications:     acetaminophen   1,000 mg Oral TID   allopurinol   100 mg Oral Daily   amLODipine   10 mg Oral QHS   carvedilol   25 mg Oral BID WC   Chlorhexidine  Gluconate Cloth  6 each Topical Q0600   cloNIDine   0.1 mg Oral Daily   doxazosin   2 mg Oral Daily   furosemide   160 mg Oral BID   heparin   5,000 Units Subcutaneous Q8H   irbesartan   300 mg Oral Daily   sevelamer  carbonate  2.4 g Oral Q breakfast   Gordy Blanch, MD 05/25/2024, 7:53 AM

## 2024-05-25 NOTE — Plan of Care (Signed)
  Problem: Clinical Measurements: Goal: Ability to maintain clinical measurements within normal limits will improve Outcome: Progressing Goal: Will remain free from infection Outcome: Progressing Goal: Diagnostic test results will improve Outcome: Progressing Goal: Respiratory complications will improve Outcome: Progressing Goal: Cardiovascular complication will be avoided Outcome: Progressing   Problem: Nutrition: Goal: Adequate nutrition will be maintained Outcome: Progressing   Problem: Activity: Goal: Risk for activity intolerance will decrease Outcome: Progressing   Problem: Elimination: Goal: Will not experience complications related to bowel motility Outcome: Progressing Goal: Will not experience complications related to urinary retention Outcome: Progressing

## 2024-05-25 NOTE — Plan of Care (Signed)
 Patient ambulatory in room. Dressing on right chest from dialysis catheter remains clean dry and intact. Dressing on fistula site clean dry and intact. No complaints of distress at this time. Patient left with bed in lowest position and side rails up.  Problem: Education: Goal: Knowledge of General Education information will improve Description: Including pain rating scale, medication(s)/side effects and non-pharmacologic comfort measures Outcome: Progressing   Problem: Health Behavior/Discharge Planning: Goal: Ability to manage health-related needs will improve Outcome: Progressing   Problem: Clinical Measurements: Goal: Ability to maintain clinical measurements within normal limits will improve Outcome: Progressing   Problem: Activity: Goal: Risk for activity intolerance will decrease Outcome: Progressing   Problem: Nutrition: Goal: Adequate nutrition will be maintained Outcome: Progressing   Problem: Coping: Goal: Level of anxiety will decrease Outcome: Progressing

## 2024-05-25 NOTE — TOC Transition Note (Signed)
 Transition of Care [facility]) - Discharge Note   Patient Details  Name: Linda [name] MRN: [id number] Date of Birth: 07-Nov-1995  [facility] CM/SW Contact:  Waddell Barnie Rama, RN Phone Number: 05/25/2024, 9:16 AM   Clinical Narrative:    For dc today.   No consult for ICM needs.         Patient Goals and CMS Choice            Discharge Placement                       Discharge Plan and Services Additional resources added to the After Visit Summary for                                       Social Drivers of Health (SDOH) Interventions SDOH Screenings   Food Insecurity: No Food Insecurity (05/19/2024)  Housing: Low Risk  (05/19/2024)  Transportation Needs: No Transportation Needs (05/19/2024)  Utilities: Not At Risk (05/19/2024)  Alcohol Screen: Low Risk  (02/03/2024)  Depression (PHQ2-9): Low Risk  (02/03/2024)  Recent Concern: Depression (PHQ2-9) - Medium Risk (01/26/2024)  Financial Resource Strain: Low Risk  (02/03/2024)  Physical Activity: Insufficiently Active (02/03/2024)  Social Connections: Socially Isolated (02/03/2024)  Stress: No Stress Concern Present (02/03/2024)  Tobacco Use: High Risk (05/23/2024)  Health Literacy: Adequate Health Literacy (02/03/2024)     Readmission Risk Interventions    11/15/2023    2:53 PM 10/25/2023    3:32 PM 10/02/2021    9:11 AM  Readmission Risk Prevention Plan  Transportation Screening Complete Complete   PCP or Specialist Appt within 3-5 Days   Complete  HRI or Home Care Consult  Complete Complete  Social Work Consult for Recovery Care Planning/Counseling  Complete   Palliative Care Screening  Complete   Medication Review Oceanographer) Referral to Pharmacy Referral to Pharmacy   PCP or Specialist appointment within 3-5 days of discharge Complete    HRI or Home Care Consult Complete    SW Recovery Care/Counseling Consult Complete    Palliative Care Screening Not Applicable    Skilled  Nursing Facility Not Applicable

## 2024-05-25 NOTE — Progress Notes (Signed)
 D/C order noted. Contacted [facility] to be advised of pt's d/c today and that pt should resume care tomorrow.   Randine Mungo Dialysis Navigator [phone]

## 2024-05-26 ENCOUNTER — Telehealth: Payer: Self-pay

## 2024-05-26 NOTE — Transitions of Care (Post Inpatient/ED Visit) (Signed)
 05/26/2024  Name: Linda [name] MRN: [id number] DOB: [birth year]  Today's TOC FU Call Status: Today's TOC FU Call Status:: Successful TOC FU Call Completed TOC FU Call Complete Date: 05/26/24 Patient's Name and Date of Birth confirmed.  Transition Care Management Follow-up Telephone Call Date of Discharge: 05/25/24 Discharge Facility: [facility]) Type of Discharge: Inpatient Admission Primary Inpatient Discharge Diagnosis:: Vascular graft occlusion How have you been since you were released from the hospital?: Better (Reports her alarm did not go off this am. Reports she missed dialysis and states the she has it scheduled for tomorrow ( Saturday)) Any questions or concerns?: Yes Patient Questions/Concerns:: wanted to know about dressing changes for where her temporary port was and where her fistula is.  Reviewed with patient no wound care on discharge instructions. Encouraged patient to discuss with dialysis nurse tomorrow. Patient voiced understanding. Patient Questions/Concerns Addressed: Other: (see above)  Items Reviewed: Did you receive and understand the discharge instructions provided?: Yes Medications obtained,verified, and reconciled?: Yes (Medications Reviewed) Any new allergies since your discharge?: No Dietary orders reviewed?: Yes Type of Diet Ordered:: reviewed renal diet and fluid restriction  Medications Reviewed Today: Medications Reviewed Today     Reviewed by Rumalda Alan PENNER, RN (Registered Nurse) on 05/26/24 at 0957  Med List Status: <None>   Medication Order Taking? Sig Documenting Provider Last Dose Status Informant  acetaminophen  (TYLENOL ) 325 MG tablet [id number] Yes Take 2 tablets (650 mg total) by mouth every 6 (six) hours. Elgergawy, Brayton RAMAN, MD  Active Self, Pharmacy Records  allopurinol  (ZYLOPRIM ) 100 MG tablet [id number] Yes Take 1 tablet (100 mg total) by mouth daily. Elgergawy, Brayton RAMAN, MD  Active Self, Pharmacy Records  amLODipine  (NORVASC )  10 MG tablet [id number] Yes Take 1 tablet (10 mg total) by mouth at bedtime. Laurence Locus, DO  Active   Blood Pressure KIT [id number]  Uncontrolled blood pressure Elgergawy, Brayton RAMAN, MD  Active Self, Pharmacy Records  Blood Pressure Monitor MISC [id number]  Use to check blood pressure Elgergawy, Brayton RAMAN, MD  Active Self, Pharmacy Records  carvedilol  (COREG ) 25 MG tablet [id number] Yes Take 1 tablet (25 mg total) by mouth 2 (two) times daily with a meal. [name], NP  Active Self, Pharmacy Records  cloNIDine  (CATAPRES ) 0.1 MG tablet [id number] Yes Take 1 tablet (0.1 mg total) by mouth daily. Elgergawy, Brayton RAMAN, MD  Active Self, Pharmacy Records  doxazosin  (CARDURA ) 2 MG tablet [id number] Yes Take 1 tablet (2 mg total) by mouth daily. Laurence Locus, DO  Active   furosemide  (LASIX ) 80 MG tablet [id number] Yes Take 1 tablet (80 mg total) by mouth daily. Fairy Frames, MD  Active Self, Pharmacy Records           Med Note (CRUTHIS, CHLOE C   Wed Mar 08, 2024 10:44 AM) Pt is adamant this is how she is taking this medication at home.   irbesartan  (AVAPRO ) 300 MG tablet [id number] Yes Take 1 tablet (300 mg total) by mouth daily. [name], NP  Active Self, Pharmacy Records  lidocaine -prilocaine  (EMLA ) cream [id number] Yes Apply 1 Application topically daily as needed (for dialysis). [provider]  Active Self, Pharmacy Records  magic mouthwash (lidocaine , diphenhydrAMINE , alum & mag hydroxide) suspension [id number] Yes Swish and spit 5 mLs 4 (four) times daily as needed for mouth pain. Laurence Locus, DO  Active   nitroGLYCERIN  (NITROSTAT ) 0.4 MG SL tablet [id number]  Place 1 tablet (0.4 mg total) under the tongue every 5 (five)  minutes as needed for chest pain.  Patient not taking: Reported on 05/26/2024   [name] Nydia POUR, MD  Active Self, Pharmacy Records  ondansetron  (ZOFRAN ) 4 MG tablet [id number] Yes Take 1 tablet (4 mg total) by mouth every 6 (six) hours. Lang Norleen POUR, PA-C  Active  Self, Pharmacy Records  sevelamer  carbonate (RENVELA ) 2.4 g PACK [id number] Yes Take 2.4 g by mouth daily. [provider]  Active Self, Pharmacy Records  tiZANidine  (ZANAFLEX ) 4 MG tablet [id number] Yes Take 1 tablet (4 mg total) by mouth daily as needed for muscle spasms. [name], NP  Active Self, Pharmacy Records  Med List Note Isabel Doneta RAMAN, Bishop 09/01/23 2149): Dialysis Pablo [place], Fri            Home Care and Equipment/Supplies: Were Home Health Services Ordered?: No Any new equipment or medical supplies ordered?: No     Follow up appointments reviewed: PCP Follow-up appointment confirmed?: No (patient reports she will call today and schedule.) MD Provider Line [phone] Given: No Specialist Hospital Follow-up appointment confirmed?: Yes Date of Specialist follow-up appointment?: 06/01/24 Follow-Up Specialty Provider:: Cardiology  Placed call to patient. Reviewed reason for call. Patient reports that she has all her medications and is taking them as directed.  Reports that she missed dialysis today due to her alarm not going off. Reports that she called the dialysis center and is scheduled for tomorrow.  Patient declines me to review her discharge instructions , SDOH and assessments. Reports that she knows what to do.  Reports that she did have questions about her dressing changes. As I reviewed the notes- no wound care is noted. I encouraged patient to ask Dialysis nurse tomorrow during her session.  Reviewed diet and fluid restrictions.   Provided my contact information for patient to call me back if needed. She thanked me for call.   Alan Ee, RN, BSN, CEN [employer]- Transition of Care Team.  [facility] [phone]

## 2024-05-29 ENCOUNTER — Emergency Department (HOSPITAL_COMMUNITY)

## 2024-05-29 ENCOUNTER — Encounter (HOSPITAL_COMMUNITY): Payer: Self-pay

## 2024-05-29 ENCOUNTER — Observation Stay (HOSPITAL_COMMUNITY)
Admission: EM | Admit: 2024-05-29 | Discharge: 2024-05-31 | Disposition: A | Discharge status: Home / Self Care | Attending: Internal Medicine | Admitting: Internal Medicine

## 2024-05-29 ENCOUNTER — Other Ambulatory Visit: Payer: Self-pay

## 2024-05-29 DIAGNOSIS — Z7901 Long term (current) use of anticoagulants: Secondary | ICD-10-CM | POA: Diagnosis not present

## 2024-05-29 DIAGNOSIS — D631 Anemia in chronic kidney disease: Secondary | ICD-10-CM | POA: Diagnosis not present

## 2024-05-29 DIAGNOSIS — E7401 von Gierke disease: Secondary | ICD-10-CM | POA: Diagnosis present

## 2024-05-29 DIAGNOSIS — I10 Essential (primary) hypertension: Secondary | ICD-10-CM | POA: Diagnosis present

## 2024-05-29 DIAGNOSIS — Y832 Surgical operation with anastomosis, bypass or graft as the cause of abnormal reaction of the patient, or of later complication, without mention of misadventure at the time of the procedure: Secondary | ICD-10-CM | POA: Diagnosis not present

## 2024-05-29 DIAGNOSIS — R9431 Abnormal electrocardiogram [ECG] [EKG]: Secondary | ICD-10-CM | POA: Diagnosis present

## 2024-05-29 DIAGNOSIS — I4581 Long QT syndrome: Secondary | ICD-10-CM | POA: Insufficient documentation

## 2024-05-29 DIAGNOSIS — D75A Glucose-6-phosphate dehydrogenase (G6PD) deficiency without anemia: Secondary | ICD-10-CM | POA: Diagnosis not present

## 2024-05-29 DIAGNOSIS — N2581 Secondary hyperparathyroidism of renal origin: Secondary | ICD-10-CM | POA: Diagnosis not present

## 2024-05-29 DIAGNOSIS — Z992 Dependence on renal dialysis: Secondary | ICD-10-CM | POA: Insufficient documentation

## 2024-05-29 DIAGNOSIS — D638 Anemia in other chronic diseases classified elsewhere: Secondary | ICD-10-CM | POA: Diagnosis present

## 2024-05-29 DIAGNOSIS — Z8269 Family history of other diseases of the musculoskeletal system and connective tissue: Secondary | ICD-10-CM | POA: Diagnosis not present

## 2024-05-29 DIAGNOSIS — K219 Gastro-esophageal reflux disease without esophagitis: Secondary | ICD-10-CM | POA: Diagnosis present

## 2024-05-29 DIAGNOSIS — Z789 Other specified health status: Principal | ICD-10-CM

## 2024-05-29 DIAGNOSIS — T82590A Other mechanical complication of surgically created arteriovenous fistula, initial encounter: Secondary | ICD-10-CM | POA: Diagnosis present

## 2024-05-29 DIAGNOSIS — Z452 Encounter for adjustment and management of vascular access device: Secondary | ICD-10-CM | POA: Diagnosis not present

## 2024-05-29 DIAGNOSIS — Z79899 Other long term (current) drug therapy: Secondary | ICD-10-CM | POA: Insufficient documentation

## 2024-05-29 DIAGNOSIS — T82898A Other specified complication of vascular prosthetic devices, implants and grafts, initial encounter: Secondary | ICD-10-CM | POA: Diagnosis present

## 2024-05-29 DIAGNOSIS — M329 Systemic lupus erythematosus, unspecified: Secondary | ICD-10-CM | POA: Insufficient documentation

## 2024-05-29 DIAGNOSIS — I132 Hypertensive heart and chronic kidney disease with heart failure and with stage 5 chronic kidney disease, or end stage renal disease: Secondary | ICD-10-CM | POA: Diagnosis not present

## 2024-05-29 DIAGNOSIS — N186 End stage renal disease: Secondary | ICD-10-CM

## 2024-05-29 DIAGNOSIS — R0602 Shortness of breath: Secondary | ICD-10-CM | POA: Diagnosis not present

## 2024-05-29 DIAGNOSIS — F419 Anxiety disorder, unspecified: Secondary | ICD-10-CM | POA: Diagnosis present

## 2024-05-29 DIAGNOSIS — T82868A Thrombosis of vascular prosthetic devices, implants and grafts, initial encounter: Secondary | ICD-10-CM | POA: Diagnosis not present

## 2024-05-29 DIAGNOSIS — F17210 Nicotine dependence, cigarettes, uncomplicated: Secondary | ICD-10-CM | POA: Insufficient documentation

## 2024-05-29 DIAGNOSIS — I5020 Unspecified systolic (congestive) heart failure: Secondary | ICD-10-CM | POA: Diagnosis not present

## 2024-05-29 DIAGNOSIS — I517 Cardiomegaly: Secondary | ICD-10-CM | POA: Diagnosis not present

## 2024-05-29 DIAGNOSIS — R0989 Other specified symptoms and signs involving the circulatory and respiratory systems: Secondary | ICD-10-CM | POA: Diagnosis not present

## 2024-05-29 DIAGNOSIS — I5022 Chronic systolic (congestive) heart failure: Secondary | ICD-10-CM | POA: Insufficient documentation

## 2024-05-29 DIAGNOSIS — Z8249 Family history of ischemic heart disease and other diseases of the circulatory system: Secondary | ICD-10-CM | POA: Insufficient documentation

## 2024-05-29 LAB — BASIC METABOLIC PANEL WITH GFR
Anion gap: 16 — ABNORMAL HIGH (ref 5–15)
BUN: 51 mg/dL — ABNORMAL HIGH (ref 6–20)
CO2: 22 mmol/L (ref 22–32)
Calcium: 8.9 mg/dL (ref 8.9–10.3)
Chloride: 91 mmol/L — ABNORMAL LOW (ref 98–111)
Creatinine, Ser: 8.64 mg/dL — ABNORMAL HIGH (ref 0.44–1.00)
GFR, Estimated: 6 mL/min — ABNORMAL LOW (ref >60)
Glucose, Bld: 81 mg/dL (ref 70–99)
Potassium: 4.8 mmol/L (ref 3.5–5.1)
Sodium: 129 mmol/L — ABNORMAL LOW (ref 135–145)

## 2024-05-29 LAB — CBC
HCT: 29.1 % — ABNORMAL LOW (ref 36.0–46.0)
Hemoglobin: 9.2 g/dL — ABNORMAL LOW (ref 12.0–15.0)
MCH: 30.6 pg (ref 26.0–34.0)
MCHC: 31.6 g/dL (ref 30.0–36.0)
MCV: 96.7 fL (ref 80.0–100.0)
Platelets: 235 K/uL (ref 150–400)
RBC: 3.01 MIL/uL — ABNORMAL LOW (ref 3.87–5.11)
RDW: 18.4 % — ABNORMAL HIGH (ref 11.5–15.5)
WBC: 7.1 K/uL (ref 4.0–10.5)
nRBC: 0 % (ref 0.0–0.2)

## 2024-05-29 LAB — HCG, SERUM, QUALITATIVE: Preg, Serum: NEGATIVE

## 2024-05-29 MED ORDER — DIPHENHYDRAMINE HCL 50 MG/ML IJ SOLN
50.0000 mg | Freq: Once | INTRAMUSCULAR | Status: AC
  Filled 2024-05-29: qty 1

## 2024-05-29 MED ORDER — PREDNISONE 50 MG PO TABS
50.0000 mg | ORAL_TABLET | Freq: Four times a day (QID) | ORAL | Status: AC
  Administered 2024-05-29 – 2024-05-30 (×2): 50 mg via ORAL
  Filled 2024-05-29: qty 1
  Filled 2024-05-29: qty 3
  Filled 2024-05-29: qty 1

## 2024-05-29 MED ORDER — CHLORHEXIDINE GLUCONATE CLOTH 2 % EX PADS
6.0000 | MEDICATED_PAD | Freq: Every day | CUTANEOUS | Status: DC
  Administered 2024-05-31: 6 via TOPICAL

## 2024-05-29 MED ORDER — FUROSEMIDE 40 MG PO TABS
80.0000 mg | ORAL_TABLET | Freq: Every day | ORAL | Status: DC
  Administered 2024-05-30: 80 mg via ORAL
  Filled 2024-05-29 (×2): qty 2

## 2024-05-29 MED ORDER — CARVEDILOL 25 MG PO TABS
25.0000 mg | ORAL_TABLET | Freq: Two times a day (BID) | ORAL | Status: DC
  Administered 2024-05-29 – 2024-05-30 (×3): 25 mg via ORAL
  Filled 2024-05-29 (×2): qty 1
  Filled 2024-05-29: qty 2

## 2024-05-29 MED ORDER — CLONIDINE HCL 0.1 MG PO TABS
0.1000 mg | ORAL_TABLET | Freq: Every day | ORAL | Status: DC
  Administered 2024-05-30: 0.1 mg via ORAL
  Filled 2024-05-29 (×2): qty 1

## 2024-05-29 MED ORDER — ACETAMINOPHEN 325 MG PO TABS
650.0000 mg | ORAL_TABLET | Freq: Four times a day (QID) | ORAL | Status: DC | PRN
  Filled 2024-05-29: qty 2

## 2024-05-29 MED ORDER — DIPHENHYDRAMINE HCL 12.5 MG/5ML PO LIQD: 5.0000 mL | Freq: Four times a day (QID) | ORAL | Status: DC | PRN

## 2024-05-29 MED ORDER — ACETAMINOPHEN 650 MG RE SUPP: 650.0000 mg | Freq: Four times a day (QID) | RECTAL | Status: DC | PRN

## 2024-05-29 MED ORDER — FENTANYL CITRATE PF 50 MCG/ML IJ SOSY
6.2500 ug | PREFILLED_SYRINGE | Freq: Once | INTRAMUSCULAR | Status: AC
  Administered 2024-05-29: 6.5 ug via INTRAVENOUS
  Filled 2024-05-29: qty 1

## 2024-05-29 MED ORDER — SODIUM CHLORIDE 0.9% FLUSH
3.0000 mL | Freq: Two times a day (BID) | INTRAVENOUS | Status: DC
  Administered 2024-05-29 – 2024-05-30 (×2): 3 mL via INTRAVENOUS

## 2024-05-29 MED ORDER — IRBESARTAN 300 MG PO TABS
300.0000 mg | ORAL_TABLET | Freq: Every day | ORAL | Status: DC
  Administered 2024-05-30: 300 mg via ORAL
  Filled 2024-05-29 (×2): qty 1

## 2024-05-29 MED ORDER — AMLODIPINE BESYLATE 10 MG PO TABS
10.0000 mg | ORAL_TABLET | Freq: Every day | ORAL | Status: DC
  Administered 2024-05-29 – 2024-05-30 (×2): 10 mg via ORAL
  Filled 2024-05-29 (×2): qty 1

## 2024-05-29 MED ORDER — HEPARIN SODIUM (PORCINE) 5000 UNIT/ML IJ SOLN
5000.0000 [IU] | Freq: Three times a day (TID) | INTRAMUSCULAR | Status: DC
Start: 2024-05-29 — End: 2024-05-31
  Administered 2024-05-30 (×3): 5000 [IU] via SUBCUTANEOUS
  Filled 2024-05-29 (×7): qty 1

## 2024-05-29 MED ORDER — MAGIC MOUTHWASH W/LIDOCAINE
5.0000 mL | Freq: Four times a day (QID) | ORAL | Status: DC | PRN
  Administered 2024-05-29: 5 mL via ORAL
  Filled 2024-05-29 (×2): qty 5

## 2024-05-29 MED ORDER — DOXAZOSIN MESYLATE 2 MG PO TABS
2.0000 mg | ORAL_TABLET | Freq: Every day | ORAL | Status: DC
  Administered 2024-05-30: 2 mg via ORAL
  Filled 2024-05-29 (×2): qty 1

## 2024-05-29 NOTE — Progress Notes (Signed)
 VASCULAR LAB    Left upper extremity dialysis duplex has been performed.  See CV proc for preliminary results.  Relayed results to Dr. Lenor via secure chat  RACHEL PELLET, RVT 05/29/2024, 3:13 PM

## 2024-05-29 NOTE — H&P (Signed)
 History and Physical   Linda [name] FMW:[id number] DOB: 22-Sep-1995 DOA: 05/29/2024  PCP: [name], NP   Patient coming from: Home/HD  Chief Complaint: AV graft function  HPI: Linda [name] is a 28 y.o. female with medical history significant of hypertension, GERD, QT prolongation, pericardial effusion, G6PD deficiency, CHF, lupus, ESRD on HD, anxiety, anemia presenting with AV graft malfunction.  Patient recently admitted 9/19-9/25 with AV graft occlusion.  Received temporary HD catheter and underwent HD while IR addressed AV graft occlusion with thrombectomy.  Temporary catheter was removed and patient was discharged.  Patient was able to dialyze fine on Saturday but went to HD today and staff was unable to access her graft.  Denies fevers, chills, chest pain, shortness of breath, abdominal pain, constipation, diarrhea, nausea, vomiting.  ED Course: Vital signs in the ED notable for blood pressure in the 110s-130 systolic.  Lab workup included BMP with sodium 129, chloride 91, BUN 51, creatinine stable at 8.64.  CBC with hemoglobin stable at 9.2.  Chest x-ray showed moderate cardiomegaly and pulmonary vascular congestion.  Nephrology consulted and will dialyze when able, no indication for urgent dialysis today.  IR consulted to address this reoccluded graft and possible repeat temporary catheter placement.  Review of Systems: As per HPI otherwise all other systems reviewed and are negative.  Past Medical History:  Diagnosis Date   Abnormal ECG    a. In setting of LVH w/ repolarization abnormalities; b. 03/2019 MV: No ischemia/infarct. EF 55-65%.   Abscess of buttock, right 01/11/2024   Acute hypoxic respiratory failure (HCC) 10/10/2023   Acute on chronic heart failure with mildly reduced ejection fraction (HFmrEF, 41-49%) (HCC) 10/10/2023   Acute pulmonary edema (HCC) 09/30/2021   Anaphylactic shock, unspecified, initial encounter 10/02/2021   Angioedema     Aspiration pneumonia (HCC) 03/07/2024   Encounter for screening for respiratory tuberculosis 10/02/2021   ESRD on hemodialysis (HCC)    G6PD deficiency    GERD (gastroesophageal reflux disease)    High anion gap metabolic acidosis 09/02/2021   HUS (hemolytic uremic syndrome), atypical (HCC) 05/04/2018   Hypertension    Hypertensive urgency 11/29/2017   Infection due to Strongyloides 05/28/2021   Lupus    LVH (left ventricular hypertrophy)    a. 11/2017 Echo: EF 50-55%. triv AI. Mild MR. Mod L and mild R atrial enlargement. Nl RV size/fxn. Small pericardial effusion w/o tampondae; b. 03/2018 Echo: EF 55-60%, restrictive filling pattern. Nl RV size/fxn. Sev LAE. Mild MR, mild to mod TR/PR. Mod PAH. Triv effusion.   Migraines    Mixed connective tissue disease    Positive D dimer 12/22/2023   Previous Dialysis [facility])    a. Off HD since late 2020.   Resistant hypertension    Septic prepatellar bursitis of right knee 08/19/2017   Severe sepsis (HCC) 01/06/2018    Past Surgical History:  Procedure Laterality Date   A/V FISTULAGRAM N/A 03/10/2024   Procedure: A/V Fistulagram;  Surgeon: Serene Gaile ORN, MD;  Location: [facility] INVASIVE CV LAB;  Service: Cardiovascular;  Laterality: N/A;   GRAFT APPLICATION Left 03/30/2019   Procedure: BRACHIAL CEPHALIC GRAFT CREATION;  Surgeon: Marea Selinda RAMAN, MD;  Location: [facility] ORS;  Service: Vascular;  Laterality: Left;   I & D EXTREMITY Right 08/06/2017   Procedure: IRRIGATION AND DEBRIDEMENT KNEE;  Surgeon: Kendal Franky SQUIBB, MD;  Location: [facility] OR;  Service: Orthopedics;  Laterality: Right;   IR AV DIALY SHUNT INTRO NEEDLE/INTRACATH INITIAL W/PTA/IMG LEFT  05/23/2024  IR REMOVAL TUN CV CATH W/O FL  05/24/2024   IR THROMBECTOMY AV FISTULA W/THROMBOLYSIS/PTA/STENT INC/SHUNT/IMG LT Left 05/18/2022   IR TUNNELED CENTRAL VENOUS CATH PLC W IMG  05/19/2024   IR US  GUIDE VASC ACCESS LEFT  05/18/2022   IR US  GUIDE VASC ACCESS LEFT  05/23/2024   MULTIPLE TOOTH  EXTRACTIONS     RADIOLOGY WITH ANESTHESIA N/A 05/23/2024   Procedure: RADIOLOGY WITH ANESTHESIA;  Surgeon: Jennefer Ester PARAS, MD;  Location: [facility] OR;  Service: Radiology;  Laterality: N/A;    Social History  reports that she has been smoking e-cigarettes and cigars. She has never used smokeless tobacco. She reports that she does not drink alcohol and does not use drugs.  Allergies  Allergen Reactions   Iodine  Hives and Shortness Of Breath   Lisinopril  Anaphylaxis    Angioedema   Gabapentin Other (See Comments)    Reaction: Tremor (intolerance); tremor   Ativan  [Lorazepam ] Other (See Comments)    Somnolent with 1mg  ativan  at [facility] Nov 2019. Patient stated it made her feel like she shut down and was mute and non-responding.   Hydroxychloroquine  Other (See Comments)    Pt reports making her skin peel really bad   Vicodin [Hydrocodone -Acetaminophen ] Itching and Rash    Family History  Problem Relation Age of Onset   Lupus Mother    Hypertension Mother    Kidney disease Mother    Heart Problems Mother    Coronary artery disease Mother 59       stent   Diabetes Maternal Grandmother   Reviewed on admission  Prior to Admission medications   Medication Sig Start Date End Date Taking? Authorizing Provider  acetaminophen  (TYLENOL ) 325 MG tablet Take 2 tablets (650 mg total) by mouth every 6 (six) hours. 03/11/24   Elgergawy, Brayton RAMAN, MD  allopurinol  (ZYLOPRIM ) 100 MG tablet Take 1 tablet (100 mg total) by mouth daily. 03/11/24   Elgergawy, Brayton RAMAN, MD  amLODipine  (NORVASC ) 10 MG tablet Take 1 tablet (10 mg total) by mouth at bedtime. 05/24/24   Laurence Locus, DO  Blood Pressure KIT Uncontrolled blood pressure 03/11/24   Elgergawy, Brayton RAMAN, MD  Blood Pressure Monitor MISC Use to check blood pressure 03/11/24   Elgergawy, Brayton RAMAN, MD  carvedilol  (COREG ) 25 MG tablet Take 1 tablet (25 mg total) by mouth 2 (two) times daily with a meal. 05/17/24   [name], NP  cloNIDine  (CATAPRES )  0.1 MG tablet Take 1 tablet (0.1 mg total) by mouth daily. 03/11/24   Elgergawy, Brayton RAMAN, MD  doxazosin  (CARDURA ) 2 MG tablet Take 1 tablet (2 mg total) by mouth daily. 05/24/24 06/23/24  Laurence Locus, DO  furosemide  (LASIX ) 80 MG tablet Take 1 tablet (80 mg total) by mouth daily. 12/23/23   Fairy Frames, MD  irbesartan  (AVAPRO ) 300 MG tablet Take 1 tablet (300 mg total) by mouth daily. 05/17/24   Williamson, Joanna R, NP  lidocaine -prilocaine  (EMLA ) cream Apply 1 Application topically daily as needed (for dialysis). 01/14/24   [provider]  magic mouthwash (lidocaine , diphenhydrAMINE , alum & mag hydroxide) suspension Swish and spit 5 mLs 4 (four) times daily as needed for mouth pain. 05/25/24   Laurence Locus, DO  nitroGLYCERIN  (NITROSTAT ) 0.4 MG SL tablet Place 1 tablet (0.4 mg total) under the tongue every 5 (five) minutes as needed for chest pain. Patient not taking: Reported on 05/26/2024 10/28/23   Rai, Nydia POUR, MD  ondansetron  (ZOFRAN ) 4 MG tablet Take 1 tablet (4  mg total) by mouth every 6 (six) hours. 04/06/24   Robinson, John K, PA-C  sevelamer  carbonate (RENVELA ) 2.4 g PACK Take 2.4 g by mouth daily. 01/21/24   [provider]  tiZANidine  (ZANAFLEX ) 4 MG tablet Take 1 tablet (4 mg total) by mouth daily as needed for muscle spasms. 05/17/24   [name], NP    Physical Exam: Vitals:   05/29/24 9175 05/29/24 0825 05/29/24 0927 05/29/24 1440  BP:  114/77 120/81 (!) 134/105  Pulse:  75 71 70  Resp:  19 17 17   Temp:  99.3 F (37.4 C)  99.2 F (37.3 C)  SpO2:  100% 98% 99%  Weight: 74.8 kg     Height: 5' 6 (1.676 m)       Physical Exam Constitutional:      General: She is not in acute distress.    Appearance: Normal appearance.  HENT:     Head: Normocephalic and atraumatic.     Mouth/Throat:     Mouth: Mucous membranes are moist.     Pharynx: Oropharynx is clear.  Eyes:     Extraocular Movements: Extraocular movements intact.     Pupils: Pupils are  equal, round, and reactive to light.  Cardiovascular:     Rate and Rhythm: Normal rate and regular rhythm.     Pulses: Normal pulses.     Heart sounds: Normal heart sounds.  Pulmonary:     Effort: Pulmonary effort is normal. No respiratory distress.     Breath sounds: Normal breath sounds.  Abdominal:     General: Bowel sounds are normal. There is no distension.     Palpations: Abdomen is soft.     Tenderness: There is no abdominal tenderness.  Musculoskeletal:        General: No swelling or deformity.     Right lower leg: Edema (trace) present.     Left lower leg: Edema (trace) present.  Skin:    General: Skin is warm and dry.  Neurological:     General: No focal deficit present.     Mental Status: Mental status is at baseline.    Labs on Admission: I have personally reviewed following labs and imaging studies  CBC: Recent Labs  Lab 05/23/24 1352 05/23/24 1425 05/23/24 1541 05/23/24 1704 05/29/24 0848  WBC  --   --   --   --  7.1  HGB 10.9* 10.9* 10.5* 10.9* 9.2*  HCT 32.0* 32.0* 31.0* 32.0* 29.1*  MCV  --   --   --   --  96.7  PLT  --   --   --   --  235    Basic Metabolic Panel: Recent Labs  Lab 05/23/24 1352 05/23/24 1425 05/23/24 1541 05/23/24 1704 05/29/24 0848  NA 124* 125* 126* 125* 129*  K 6.6* 5.7* 5.8* 6.3* 4.8  CL 96* 96* 99 97* 91*  CO2  --   --   --   --  22  GLUCOSE 117* 188* 58* 82 81  BUN 85* 90* 87* 91* 51*  CREATININE 13.70* 13.80* 11.90* 13.50* 8.64*  CALCIUM   --   --   --   --  8.9    GFR: Estimated Creatinine Clearance: 10 mL/min (A) (by C-G formula based on SCr of 8.64 mg/dL (H)).  Liver Function Tests: No results for input(s): AST, ALT, ALKPHOS, BILITOT, PROT, ALBUMIN  in the last 168 hours.  Urine analysis:    Component Value Date/Time   COLORURINE YELLOW 04/06/2024 0529  APPEARANCEUR HAZY (A) 04/06/2024 0529   LABSPEC 1.004 (L) 04/06/2024 0529   PHURINE 9.0 (H) 04/06/2024 0529   GLUCOSEU NEGATIVE 04/06/2024  0529   HGBUR NEGATIVE 04/06/2024 0529   BILIRUBINUR NEGATIVE 04/06/2024 0529   BILIRUBINUR Negative 02/10/2018 1602   KETONESUR NEGATIVE 04/06/2024 0529   PROTEINUR 100 (A) 04/06/2024 0529   UROBILINOGEN 0.2 02/10/2018 1602   UROBILINOGEN 1.0 03/30/2015 2328   NITRITE NEGATIVE 04/06/2024 0529   LEUKOCYTESUR NEGATIVE 04/06/2024 0529    Radiological Exams on Admission: VAS US  DUPLEX DIALYSIS ACCESS (AVF, AVG) Result Date: 05/29/2024 DIALYSIS ACCESS Patient Name:  Linda [name]  Date of Exam:   05/29/2024 Medical Rec #: [id number]              Accession #:    [id number] Date of Birth: December 12, 1995              Patient Gender: F Patient Age:   89 years Exam Location:  [facility] Procedure:      VAS US  DUPLEX DIALYSIS ACCESS (AVF, AVG) Referring Phys: MELANIE BELFI --------------------------------------------------------------------------------  Reason for Exam: No palpable thrill for AVF/AVG. Access Site: Left Upper Extremity. Access Type: Brachial-cephalic with axillary stent. History: Patient had thrombectomy of left upper arm AV graft by interventional          radiology 05/23/24. Patient had successful dialysis treatment Saturday,          9/17, but could not complete dialysis today because fistula was not          working. Patient has significant pain in left upper extremity. Limitations: Pain, tissue properties Comparison Study: Prior study indicating occluded axillary stent done 05/19/2024 Performing Technologist: Alberta Lis RVS  Examination Guidelines: A complete evaluation includes B-mode imaging, spectral Doppler, color Doppler, and power Doppler as needed of all accessible portions of each vessel. Unilateral testing is considered an integral part of a complete examination. Limited examinations for reoccurring indications may be performed as noted.    Summary: Arteriovenous fistula-Thrombus noted.  Occluded left axillary stent. Abnormal left radial and ulnar arterial flow.     --------------------------------------------------------------------------------   Preliminary    DG Chest 2 View Result Date: 05/29/2024 CLINICAL DATA:  SOB EXAM: CHEST - 2 VIEW COMPARISON:  05/19/2024 FINDINGS: Mild central pulmonary vascular congestion. No focal airspace consolidation, pleural effusion, or pneumothorax. Moderate cardiomegaly.No acute fracture or destructive lesion. Partially visualized vascular stent in the left axilla. IMPRESSION: Moderate cardiomegaly with central pulmonary vascular congestion. Electronically Signed   By: Rogelia Myers M.D.   On: 05/29/2024 09:34    EKG: Independently reviewed.  Sinus rhythm at 73 bpm.  Nonspecific T wave changes.  QRS okay at 473.  Assessment/Plan Principal Problem:   AV graft malfunction, initial encounter Active Problems:   Vascular graft occlusion   Essential hypertension   SLE (systemic lupus erythematosus related syndrome) (HCC)   GERD without esophagitis   ESRD on dialysis (HCC)   Prolonged QT interval   Anxiety   Anemia of chronic disease   HFrEF (heart failure with reduced ejection fraction) (HCC)   G6P deficiency (glucose-6-phosphatase deficiency) (HCC)   ESRD on HD AV graft malfunction > Recent occluded graft for which she was admitted 9/19-9/25.  Thrombectomy by IR and temporary catheter while admitted.  Catheter removed and patient discharged home.  Dialyzed fine on Saturday but today staff unable to access graft again. > ED consulted IR to address access and nephrology for dialysis when able. - Will monitor on telemetry overnight for  now - Appreciate IR and nephrology recommendations and assistance - HD when able - Trend renal function and electrolytes  Hypertension - Continue home amlodipine , carvedilol , clonidine , Lasix , irbesartan   Chronic systolic CHF > Last echo in July showed EF 30-35%, indeterminate diastolic function, normal RV function. - Continue Lasix , Coreg , irbesartan  as above  Lupus > Follows  with [facility] rheumatology - Not currently on medication  Anemia > Hemoglobin stable at 9.2 - Trend CBC  G6PD deficiency - Noted  DVT prophylaxis: Heparin  Code Status:   Full Family Communication:  None on admission  Disposition Plan:   Patient is from:  Home  Anticipated DC to:  Home  Anticipated DC date:  1 to 3 days  Anticipated DC barriers: None  Consults called:  Interventional radiology, nephrology Admission status:  Observation, telemetry  Severity of Illness: The appropriate patient status for this patient is OBSERVATION. Observation status is judged to be reasonable and necessary in order to provide the required intensity of service to ensure the patient's safety. The patient's presenting symptoms, physical exam findings, and initial radiographic and laboratory data in the context of their medical condition is felt to place them at decreased risk for further clinical deterioration. Furthermore, it is anticipated that the patient will be medically stable for discharge from the hospital within 2 midnights of admission.    Marsa KATHEE Scurry MD [facility]  How to contact the [facility] Attending or Consulting provider 7A - 7P or covering provider during after hours 7P -7A, for this patient?   Check the care team in [facility] and look for a) attending/consulting [facility] provider listed and b) the [facility] team listed Log into [email or web address] and use 's universal password to access. If you do not have the password, please contact the hospital operator. Locate the [facility] provider you are looking for under [facility] and page to a number that you can be directly reached. If you still have difficulty reaching the provider, please page the [facility] (Director on Call) for the Hospitalists listed on amion for assistance.  05/29/2024, 3:54 PM

## 2024-05-29 NOTE — Consult Note (Signed)
 Renal Service Consult Note [facility] [name], MD  Patient: Linda [name] Date: 05/29/2024 Requesting Physician: Dr. Seena  Reason for Consult: ESRD patient with clotted access HPI: The patient is a 28 y.o. year-old w/ PMH as below who presented to ED today complaining of clotted AV graft.  Patient was recently admitted for declot of her clotted left arm AV graft was done by IR on May 23, 2024.  The access was placed initially by Dr. Marea in July 2020.  [facility]VS did a fistulogram back in July for pulling clots and the procedure showed no areas of stenosis or issues.  After the recent declot the patient was discharged on 05/25/2024.  Her last good dialysis was this week and passed on 06/24/24.  She went to dialysis today which is her usual day and they found the graft to be occluded.  In the ED BP 120/80, HR 71, RR 17, temp 99.2.  98% O2 sat on 2 L nasal cannula.  Sodium 129, potassium 4.8, BUN 51, creatinine 8.6.  Hemoglobin 9.2.  Patient was seen by ED team and IR was consulted.  We were asked to to consult for clotted access and ESRD.   Pt seen in room.  She denies any serious shortness of breath but does say she has probably got 10 to 15 kg on at this time.  On her recent admission she was over by at least 10 kg.  She feels swelling in her stomach and her legs and mild shortness of breath with activity.  She does not wear oxygen at home.   ROS - denies CP, no joint pain, no HA, no blurry vision, no rash, no diarrhea, no nausea/ vomiting   Past Medical History  Past Medical History:  Diagnosis Date   Abnormal ECG    a. In setting of LVH w/ repolarization abnormalities; b. 03/2019 MV: No ischemia/infarct. EF 55-65%.   Abscess of buttock, right 01/11/2024   Acute hypoxic respiratory failure (HCC) 10/10/2023   Acute on chronic heart failure with mildly reduced ejection fraction (HFmrEF, 41-49%) (HCC) 10/10/2023   Acute pulmonary edema (HCC) 09/30/2021    Anaphylactic shock, unspecified, initial encounter 10/02/2021   Angioedema    Aspiration pneumonia (HCC) 03/07/2024   Encounter for screening for respiratory tuberculosis 10/02/2021   ESRD on hemodialysis (HCC)    G6PD deficiency    GERD (gastroesophageal reflux disease)    High anion gap metabolic acidosis 09/02/2021   HUS (hemolytic uremic syndrome), atypical (HCC) 05/04/2018   Hypertension    Hypertensive urgency 11/29/2017   Infection due to Strongyloides 05/28/2021   Lupus    LVH (left ventricular hypertrophy)    a. 11/2017 Echo: EF 50-55%. triv AI. Mild MR. Mod L and mild R atrial enlargement. Nl RV size/fxn. Small pericardial effusion w/o tampondae; b. 03/2018 Echo: EF 55-60%, restrictive filling pattern. Nl RV size/fxn. Sev LAE. Mild MR, mild to mod TR/PR. Mod PAH. Triv effusion.   Migraines    Mixed connective tissue disease    Positive D dimer 12/22/2023   Previous Dialysis patient [facility])    a. Off HD since late 2020.   Resistant hypertension    Septic prepatellar bursitis of right knee 08/19/2017   Severe sepsis (HCC) 01/06/2018   Past Surgical History  Past Surgical History:  Procedure Laterality Date   A/V FISTULAGRAM N/A 03/10/2024   Procedure: A/V Fistulagram;  Surgeon: Serene Gaile ORN, MD;  Location: [facility] INVASIVE CV LAB;  Service: Cardiovascular;  Laterality: N/A;  GRAFT APPLICATION Left 03/30/2019   Procedure: BRACHIAL CEPHALIC GRAFT CREATION;  Surgeon: Marea Selinda RAMAN, MD;  Location: [facility] ORS;  Service: Vascular;  Laterality: Left;   I & D EXTREMITY Right 08/06/2017   Procedure: IRRIGATION AND DEBRIDEMENT KNEE;  Surgeon: Kendal Franky SQUIBB, MD;  Location: [facility] OR;  Service: Orthopedics;  Laterality: Right;   IR AV DIALY SHUNT INTRO NEEDLE/INTRACATH INITIAL W/PTA/IMG LEFT  05/23/2024   IR REMOVAL TUN CV CATH W/O FL  05/24/2024   IR THROMBECTOMY AV FISTULA W/THROMBOLYSIS/PTA/STENT INC/SHUNT/IMG LT Left 05/18/2022   IR TUNNELED CENTRAL VENOUS CATH PLC W IMG  05/19/2024   IR US   GUIDE VASC ACCESS LEFT  05/18/2022   IR US  GUIDE VASC ACCESS LEFT  05/23/2024   MULTIPLE TOOTH EXTRACTIONS     RADIOLOGY WITH ANESTHESIA N/A 05/23/2024   Procedure: RADIOLOGY WITH ANESTHESIA;  Surgeon: Jennefer Ester PARAS, MD;  Location: [facility] OR;  Service: Radiology;  Laterality: N/A;   Family History  Family History  Problem Relation Age of Onset   Lupus Mother    Hypertension Mother    Kidney disease Mother    Heart Problems Mother    Coronary artery disease Mother 84       stent   Diabetes Maternal Grandmother    Social History  reports that she has been smoking e-cigarettes and cigars. She has never used smokeless tobacco. She reports that she does not drink alcohol and does not use drugs. Allergies  Allergies  Allergen Reactions   Iodine  Hives and Shortness Of Breath   Lisinopril  Anaphylaxis    Angioedema   Gabapentin Other (See Comments)    Reaction: Tremor (intolerance); tremor   Ativan  [Lorazepam ] Other (See Comments)    Somnolent with 1mg  ativan  at [facility] Nov 2019. Patient stated it made her feel like she shut down and was mute and non-responding.   Hydroxychloroquine  Other (See Comments)    Pt reports making her skin peel really bad   Vicodin [Hydrocodone -Acetaminophen ] Itching and Rash   Home medications Prior to Admission medications   Medication Sig Start Date End Date Taking? Authorizing Provider  acetaminophen  (TYLENOL ) 325 MG tablet Take 2 tablets (650 mg total) by mouth every 6 (six) hours. 03/11/24   Elgergawy, Brayton RAMAN, MD  allopurinol  (ZYLOPRIM ) 100 MG tablet Take 1 tablet (100 mg total) by mouth daily. 03/11/24   Elgergawy, Brayton RAMAN, MD  amLODipine  (NORVASC ) 10 MG tablet Take 1 tablet (10 mg total) by mouth at bedtime. 05/24/24   Laurence Locus, DO  Blood Pressure KIT Uncontrolled blood pressure 03/11/24   Elgergawy, Brayton RAMAN, MD  Blood Pressure Monitor MISC Use to check blood pressure 03/11/24   Elgergawy, Brayton RAMAN, MD  carvedilol  (COREG ) 25 MG tablet Take 1 tablet  (25 mg total) by mouth 2 (two) times daily with a meal. 05/17/24   [name], NP  cloNIDine  (CATAPRES ) 0.1 MG tablet Take 1 tablet (0.1 mg total) by mouth daily. 03/11/24   Elgergawy, Brayton RAMAN, MD  doxazosin  (CARDURA ) 2 MG tablet Take 1 tablet (2 mg total) by mouth daily. 05/24/24 06/23/24  Laurence Locus, DO  furosemide  (LASIX ) 80 MG tablet Take 1 tablet (80 mg total) by mouth daily. 12/23/23   Fairy Frames, MD  irbesartan  (AVAPRO ) 300 MG tablet Take 1 tablet (300 mg total) by mouth daily. 05/17/24   Williamson, Joanna R, NP  lidocaine -prilocaine  (EMLA ) cream Apply 1 Application topically daily as needed (for dialysis). 01/14/24   [provider]  magic mouthwash (lidocaine ,  diphenhydrAMINE , alum & mag hydroxide) suspension Swish and spit 5 mLs 4 (four) times daily as needed for mouth pain. 05/25/24   Laurence Locus, DO  nitroGLYCERIN  (NITROSTAT ) 0.4 MG SL tablet Place 1 tablet (0.4 mg total) under the tongue every 5 (five) minutes as needed for chest pain. Patient not taking: Reported on 05/26/2024 10/28/23   Rai, Nydia POUR, MD  ondansetron  (ZOFRAN ) 4 MG tablet Take 1 tablet (4 mg total) by mouth every 6 (six) hours. 04/06/24   Robinson, John K, PA-C  sevelamer  carbonate (RENVELA ) 2.4 g PACK Take 2.4 g by mouth daily. 01/21/24   [provider]  tiZANidine  (ZANAFLEX ) 4 MG tablet Take 1 tablet (4 mg total) by mouth daily as needed for muscle spasms. 05/17/24   [name], NP     Vitals:   05/29/24 9175 05/29/24 0825 05/29/24 0927 05/29/24 1440  BP:  114/77 120/81 (!) 134/105  Pulse:  75 71 70  Resp:  19 17 17   Temp:  99.3 F (37.4 C)  99.2 F (37.3 C)  SpO2:  100% 98% 99%  Weight: 74.8 kg     Height: 5' 6 (1.676 m)      Exam Gen alert, no distress, 2 L [place] O2 Sclera anicteric, throat clear  No jvd or bruits Chest clear bilat to bases, no rales or wheezing RRR no MRG Abd soft ntnd no mass or ascites +bs, Ext 1+ bilateral LE edema, no other edema Neuro is  alert, Ox 3 , nf    LUA AVG no bruit   Home bp meds: Norvasc  10 every day Coreg  25 bid Cardura  2mg  every day Lasix  80mg  daily Avapro  300mg  qd   OP HD: SW MWF 3.5h  B400  68.4kg   2K bath   L AVG   Heparin  3000 Last OP HD 9/27, post wt 80.2kg   Assessment/ Plan: Clotted L arm AV graft: 2nd time clotting, just declotted 1-2 wks ago. IR consulted for declot, she does have allergic to contrast and will need pre-treatment. Appreciate IR assistance.  ESRD: on HD MWF. Last HD Sat off schedule. Plan HD tomorrow.  Vol overload: sig vol overloaded (10-15kg up) but tolerating well surprisingly. Max UF w/ next HD.  HTN: bp's are wnl here, cont home meds prn Anemia of esrd: Hb 9-11 here, follow. Was not on esa's at OP unit due to Hb > 10-11.        Myer Fret  MD [facility] 05/29/2024, 4:46 PM  Recent Labs  Lab 05/23/24 1704 05/29/24 0848  HGB 10.9* 9.2*  CALCIUM   --  8.9  CREATININE 13.50* 8.64*  K 6.3* 4.8   Inpatient medications:  amLODipine   10 mg Oral QHS   carvedilol   25 mg Oral BID WC   [START ON 05/30/2024] cloNIDine   0.1 mg Oral Daily   [START ON 05/30/2024] diphenhydrAMINE   50 mg Intravenous Once   [START ON 05/30/2024] doxazosin   2 mg Oral Daily   [START ON 05/30/2024] furosemide   80 mg Oral Daily   heparin   5,000 Units Subcutaneous Q8H   [START ON 05/30/2024] irbesartan   300 mg Oral Daily   predniSONE   50 mg Oral Q6H   sodium chloride  flush  3 mL Intravenous Q12H    acetaminophen  **OR** acetaminophen , lidocaine  (XYLOCAINE ) 2 % 1.6667 mL, diphenhydrAMINE  (BENADRYL ) 12.5 MG/5ML 1.6667 mL, alum & mag hydroxide-simeth (MAALOX/MYLANTA) 200-200-20 MG/5ML 1.6667 mL

## 2024-05-29 NOTE — ED Triage Notes (Signed)
 Pt from dialysis, could not complete treatment because left arm fistula was not working. Pt had treatment Saturday and fistula working just fine. Pt c/o SOB.

## 2024-05-29 NOTE — Progress Notes (Signed)
 Interventional Radiology Brief Note:  Patient known to IR from recent declotting procedure 9/23.  She returns to ED from dialysis today after graft found to be unusable.   IR consulted for declot +/- tunneled dialysis catheter placement.   Patient requires general anesthesia for procedure as well as pre-medication for iodine  allergy.  Orders in place.   Case booked and posted with anesthesia for 9am.   NPO p MN.  Aidyn Kellis, MS RD PA-C

## 2024-05-29 NOTE — ED Provider Notes (Signed)
 [facility] EMERGENCY DEPARTMENT AT [facility] Provider Note   CSN: [id number] Arrival date & time: 05/29/24  9182     Patient presents with: Vascular Access Problem and Shortness of Breath   Linda [name] is a 28 y.o. female.   Patient is a 28 year old female who has a history of end-stage renal disease on dialysis.  She was just at the hospital last week after she had a clotted dialysis fistula and had a thrombectomy done by interventional radiology.  She was discharged on September 24.  She went to get her dialysis today and they were unable to get a thrill in the fistula and sent her here for possible reclotted graft.  She is feeling a little bit short of breath.  Her last treatment was on Saturday.  She feels like she is fluid overloaded.  No vomiting.  No fevers.       Prior to Admission medications   Medication Sig Start Date End Date Taking? Authorizing Provider  acetaminophen  (TYLENOL ) 325 MG tablet Take 2 tablets (650 mg total) by mouth every 6 (six) hours. 03/11/24   Elgergawy, Brayton RAMAN, MD  allopurinol  (ZYLOPRIM ) 100 MG tablet Take 1 tablet (100 mg total) by mouth daily. 03/11/24   Elgergawy, Brayton RAMAN, MD  amLODipine  (NORVASC ) 10 MG tablet Take 1 tablet (10 mg total) by mouth at bedtime. 05/24/24   Laurence Locus, DO  Blood Pressure KIT Uncontrolled blood pressure 03/11/24   Elgergawy, Brayton RAMAN, MD  Blood Pressure Monitor MISC Use to check blood pressure 03/11/24   Elgergawy, Brayton RAMAN, MD  carvedilol  (COREG ) 25 MG tablet Take 1 tablet (25 mg total) by mouth 2 (two) times daily with a meal. 05/17/24   [name], NP  cloNIDine  (CATAPRES ) 0.1 MG tablet Take 1 tablet (0.1 mg total) by mouth daily. 03/11/24   Elgergawy, Brayton RAMAN, MD  doxazosin  (CARDURA ) 2 MG tablet Take 1 tablet (2 mg total) by mouth daily. 05/24/24 06/23/24  Laurence Locus, DO  furosemide  (LASIX ) 80 MG tablet Take 1 tablet (80 mg total) by mouth daily. 12/23/23   Fairy Frames, MD  irbesartan   (AVAPRO ) 300 MG tablet Take 1 tablet (300 mg total) by mouth daily. 05/17/24   Williamson, Joanna R, NP  lidocaine -prilocaine  (EMLA ) cream Apply 1 Application topically daily as needed (for dialysis). 01/14/24   [provider]  magic mouthwash (lidocaine , diphenhydrAMINE , alum & mag hydroxide) suspension Swish and spit 5 mLs 4 (four) times daily as needed for mouth pain. 05/25/24   Laurence Locus, DO  nitroGLYCERIN  (NITROSTAT ) 0.4 MG SL tablet Place 1 tablet (0.4 mg total) under the tongue every 5 (five) minutes as needed for chest pain. Patient not taking: Reported on 05/26/2024 10/28/23   Rai, Nydia POUR, MD  ondansetron  (ZOFRAN ) 4 MG tablet Take 1 tablet (4 mg total) by mouth every 6 (six) hours. 04/06/24   Robinson, John K, PA-C  sevelamer  carbonate (RENVELA ) 2.4 g PACK Take 2.4 g by mouth daily. 01/21/24   [provider]  tiZANidine  (ZANAFLEX ) 4 MG tablet Take 1 tablet (4 mg total) by mouth daily as needed for muscle spasms. 05/17/24   [name], NP    Allergies: Iodine , Lisinopril , Gabapentin, Ativan  [lorazepam ], Hydroxychloroquine , and Vicodin [hydrocodone -acetaminophen ]    Review of Systems  Constitutional:  Positive for fatigue. Negative for chills, diaphoresis and fever.  HENT:  Negative for congestion, rhinorrhea and sneezing.   Eyes: Negative.   Respiratory:  Positive for shortness of breath. Negative for  cough and chest tightness.   Cardiovascular:  Positive for leg swelling. Negative for chest pain.  Gastrointestinal:  Negative for abdominal pain, diarrhea, nausea and vomiting.  Musculoskeletal:  Negative for arthralgias and back pain.  Skin:  Negative for rash.  Neurological:  Negative for dizziness, speech difficulty, weakness, numbness and headaches.    Updated Vital Signs BP (!) 134/105   Pulse 70   Temp 99.2 F (37.3 C)   Resp 17   Ht 5' 6 (1.676 m)   Wt 74.8 kg   SpO2 99%   BMI 26.63 kg/m   Physical Exam Constitutional:      Appearance: She  is well-developed.  HENT:     Head: Normocephalic and atraumatic.  Eyes:     Pupils: Pupils are equal, round, and reactive to light.  Cardiovascular:     Rate and Rhythm: Normal rate and regular rhythm.     Heart sounds: Normal heart sounds.  Pulmonary:     Effort: Pulmonary effort is normal. No respiratory distress.     Breath sounds: Normal breath sounds. No wheezing or rales.  Chest:     Chest wall: No tenderness.  Abdominal:     General: Bowel sounds are normal.     Palpations: Abdomen is soft.     Tenderness: There is no abdominal tenderness. There is no guarding or rebound.  Musculoskeletal:        General: Normal range of motion.     Cervical back: Normal range of motion and neck supple.     Comments: AV fistula in the left arm.  No palpable thrill.  Mildly warm to the touch and erythematous  Lymphadenopathy:     Cervical: No cervical adenopathy.  Skin:    General: Skin is warm and dry.     Findings: No rash.  Neurological:     Mental Status: She is alert and oriented to person, place, and time.     (all labs ordered are listed, but only abnormal results are displayed) Labs Reviewed  BASIC METABOLIC PANEL WITH GFR - Abnormal; Notable for the following components:      Result Value   Sodium 129 (*)    Chloride 91 (*)    BUN 51 (*)    Creatinine, Ser 8.64 (*)    GFR, Estimated 6 (*)    Anion gap 16 (*)    All other components within normal limits  CBC - Abnormal; Notable for the following components:   RBC 3.01 (*)    Hemoglobin 9.2 (*)    HCT 29.1 (*)    RDW 18.4 (*)    All other components within normal limits  HCG, SERUM, QUALITATIVE    EKG: None  Radiology: VAS US  DUPLEX DIALYSIS ACCESS (AVF, AVG) Result Date: 05/29/2024 DIALYSIS ACCESS Patient Name:  Linda [name]  Date of Exam:   05/29/2024 Medical Rec #: [id number]              Accession #:    [id number] Date of Birth: 1996-02-08              Patient Gender: F Patient Age:   39 years Exam  Location:  [facility] Procedure:      VAS US  DUPLEX DIALYSIS ACCESS (AVF, AVG) Referring Phys: Therese Rocco --------------------------------------------------------------------------------  Reason for Exam: No palpable thrill for AVF/AVG. Access Site: Left Upper Extremity. Access Type: Brachial-cephalic with axillary stent. History: Patient had thrombectomy of left upper arm AV graft by interventional  radiology 05/23/24. Patient had successful dialysis treatment Saturday,          9/17, but could not complete dialysis today because fistula was not          working. Patient has significant pain in left upper extremity. Limitations: Pain, tissue properties Comparison Study: Prior study indicating occluded axillary stent done 05/19/2024 Performing Technologist: Alberta Lis RVS  Examination Guidelines: A complete evaluation includes B-mode imaging, spectral Doppler, color Doppler, and power Doppler as needed of all accessible portions of each vessel. Unilateral testing is considered an integral part of a complete examination. Limited examinations for reoccurring indications may be performed as noted.    Summary: Arteriovenous fistula-Thrombus noted.  Occluded left axillary stent. Abnormal left radial and ulnar arterial flow.    --------------------------------------------------------------------------------   Preliminary    DG Chest 2 View Result Date: 05/29/2024 CLINICAL DATA:  SOB EXAM: CHEST - 2 VIEW COMPARISON:  05/19/2024 FINDINGS: Mild central pulmonary vascular congestion. No focal airspace consolidation, pleural effusion, or pneumothorax. Moderate cardiomegaly.No acute fracture or destructive lesion. Partially visualized vascular stent in the left axilla. IMPRESSION: Moderate cardiomegaly with central pulmonary vascular congestion. Electronically Signed   By: Rogelia Myers M.D.   On: 05/29/2024 09:34     Procedures   Medications Ordered in the ED - No data to display                                   Medical Decision Making Amount and/or Complexity of Data Reviewed Labs: ordered. Radiology: ordered.   Patient is a 28 year old end-stage renal disease patient who presents from dialysis center with an issue with her dialysis graft.  Labs show a normal potassium.  Chest x-ray shows some mild central vascular congestion but no overt edema.  Does not appear that she needs emergent dialysis.  Vascular ultrasound was performed which shows a rethrombosis of the graft.  Discussed with vascular surgery who felt that this would be handled best by interventional radiology.  Discussed with Dr. Philip who advises that they are not able to try another thrombectomy today but could put a temporary catheter in.  Discussed with nephrology, Dr Geralynn, who recommends to hold off on that for now and to admit the patient overnight and they can figure out what to do in the morning.  Will discuss with the hospitalist.     Final diagnoses:  Problem with vascular access    ED Discharge Orders     None          Lenor Hollering, MD 05/29/24 1539

## 2024-05-29 NOTE — ED Notes (Signed)
 Patient transported to X-ray

## 2024-05-30 ENCOUNTER — Observation Stay (HOSPITAL_COMMUNITY): Admitting: Anesthesiology

## 2024-05-30 ENCOUNTER — Observation Stay (HOSPITAL_BASED_OUTPATIENT_CLINIC_OR_DEPARTMENT_OTHER): Admitting: Anesthesiology

## 2024-05-30 ENCOUNTER — Telehealth: Payer: Self-pay | Admitting: *Deleted

## 2024-05-30 ENCOUNTER — Encounter (HOSPITAL_COMMUNITY): Payer: Self-pay | Admitting: Internal Medicine

## 2024-05-30 ENCOUNTER — Observation Stay (HOSPITAL_COMMUNITY)

## 2024-05-30 ENCOUNTER — Encounter (HOSPITAL_COMMUNITY)
Admission: EM | Disposition: A | Discharge status: Home / Self Care | Payer: Self-pay | Source: Home / Self Care | Attending: Emergency Medicine

## 2024-05-30 DIAGNOSIS — N186 End stage renal disease: Secondary | ICD-10-CM

## 2024-05-30 DIAGNOSIS — M3210 Systemic lupus erythematosus, organ or system involvement unspecified: Secondary | ICD-10-CM | POA: Diagnosis not present

## 2024-05-30 DIAGNOSIS — I120 Hypertensive chronic kidney disease with stage 5 chronic kidney disease or end stage renal disease: Secondary | ICD-10-CM | POA: Diagnosis not present

## 2024-05-30 DIAGNOSIS — I5020 Unspecified systolic (congestive) heart failure: Secondary | ICD-10-CM | POA: Diagnosis not present

## 2024-05-30 DIAGNOSIS — I132 Hypertensive heart and chronic kidney disease with heart failure and with stage 5 chronic kidney disease, or end stage renal disease: Secondary | ICD-10-CM | POA: Diagnosis not present

## 2024-05-30 DIAGNOSIS — T82868A Thrombosis of vascular prosthetic devices, implants and grafts, initial encounter: Secondary | ICD-10-CM | POA: Diagnosis not present

## 2024-05-30 DIAGNOSIS — Z992 Dependence on renal dialysis: Secondary | ICD-10-CM | POA: Diagnosis not present

## 2024-05-30 DIAGNOSIS — T82858A Stenosis of vascular prosthetic devices, implants and grafts, initial encounter: Secondary | ICD-10-CM | POA: Diagnosis not present

## 2024-05-30 DIAGNOSIS — T82590A Other mechanical complication of surgically created arteriovenous fistula, initial encounter: Secondary | ICD-10-CM | POA: Diagnosis not present

## 2024-05-30 HISTORY — PX: IR THROMBECTOMY AV FISTULA W/THROMBOLYSIS/PTA INC/SHUNT/IMG LEFT: IMG6106

## 2024-05-30 HISTORY — PX: RADIOLOGY WITH ANESTHESIA: SHX6223

## 2024-05-30 HISTORY — PX: IR US GUIDE VASC ACCESS LEFT: IMG2389

## 2024-05-30 LAB — COMPREHENSIVE METABOLIC PANEL WITH GFR
ALT: 13 U/L (ref 0–44)
AST: 24 U/L (ref 15–41)
Albumin: 2.9 g/dL — ABNORMAL LOW (ref 3.5–5.0)
Alkaline Phosphatase: 117 U/L (ref 38–126)
Anion gap: 14 (ref 5–15)
BUN: 63 mg/dL — ABNORMAL HIGH (ref 6–20)
CO2: 21 mmol/L — ABNORMAL LOW (ref 22–32)
Calcium: 9.2 mg/dL (ref 8.9–10.3)
Chloride: 90 mmol/L — ABNORMAL LOW (ref 98–111)
Creatinine, Ser: 9.93 mg/dL — ABNORMAL HIGH (ref 0.44–1.00)
GFR, Estimated: 5 mL/min — ABNORMAL LOW (ref >60)
Glucose, Bld: 159 mg/dL — ABNORMAL HIGH (ref 70–99)
Potassium: 5.4 mmol/L — ABNORMAL HIGH (ref 3.5–5.1)
Sodium: 125 mmol/L — ABNORMAL LOW (ref 135–145)
Total Bilirubin: 1.1 mg/dL (ref 0.0–1.2)
Total Protein: 7 g/dL (ref 6.5–8.1)

## 2024-05-30 LAB — CBC
HCT: 28.7 % — ABNORMAL LOW (ref 36.0–46.0)
Hemoglobin: 9.3 g/dL — ABNORMAL LOW (ref 12.0–15.0)
MCH: 30.7 pg (ref 26.0–34.0)
MCHC: 32.4 g/dL (ref 30.0–36.0)
MCV: 94.7 fL (ref 80.0–100.0)
Platelets: 260 K/uL (ref 150–400)
RBC: 3.03 MIL/uL — ABNORMAL LOW (ref 3.87–5.11)
RDW: 17.8 % — ABNORMAL HIGH (ref 11.5–15.5)
WBC: 8 K/uL (ref 4.0–10.5)
nRBC: 0 % (ref 0.0–0.2)

## 2024-05-30 LAB — MRSA NEXT GEN BY PCR, NASAL: MRSA by PCR Next Gen: NOT DETECTED

## 2024-05-30 SURGERY — RADIOLOGY WITH ANESTHESIA
Anesthesia: General

## 2024-05-30 MED ORDER — ALTEPLASE 30 MG/30 ML FOR INTERV. RAD
INTRA_ARTERIAL | Status: AC | PRN
  Administered 2024-05-30: 4 mg via INTRA_ARTERIAL

## 2024-05-30 MED ORDER — SUGAMMADEX SODIUM 200 MG/2ML IV SOLN
INTRAVENOUS | Status: DC | PRN
  Administered 2024-05-30: 200 mg via INTRAVENOUS

## 2024-05-30 MED ORDER — IOHEXOL 300 MG/ML  SOLN: 150.0000 mL | Freq: Once | INTRAMUSCULAR | Status: DC | PRN

## 2024-05-30 MED ORDER — KETAMINE HCL 50 MG/5ML IJ SOSY
PREFILLED_SYRINGE | INTRAMUSCULAR | Status: AC
  Filled 2024-05-30: qty 5

## 2024-05-30 MED ORDER — PREDNISONE 20 MG PO TABS
ORAL_TABLET | ORAL | Status: AC
  Administered 2024-05-30: 50 mg via ORAL
  Filled 2024-05-30: qty 3

## 2024-05-30 MED ORDER — ALTEPLASE 2 MG IJ SOLR
INTRAMUSCULAR | Status: AC
  Filled 2024-05-30: qty 4

## 2024-05-30 MED ORDER — KETAMINE HCL 50 MG/5ML IJ SOSY
PREFILLED_SYRINGE | INTRAMUSCULAR | Status: DC | PRN
  Administered 2024-05-30: 20 mg via INTRAVENOUS

## 2024-05-30 MED ORDER — LIDOCAINE-EPINEPHRINE 1 %-1:100000 IJ SOLN
INTRAMUSCULAR | Status: AC
Start: 2024-05-30 — End: 2024-05-30
  Filled 2024-05-30: qty 1

## 2024-05-30 MED ORDER — OXYCODONE HCL 5 MG PO TABS
2.5000 mg | ORAL_TABLET | Freq: Once | ORAL | Status: DC
  Filled 2024-05-30 (×2): qty 1

## 2024-05-30 MED ORDER — ONDANSETRON HCL 4 MG/2ML IJ SOLN
INTRAMUSCULAR | Status: DC | PRN
Start: 2024-05-30 — End: 2024-05-30
  Administered 2024-05-30: 4 mg via INTRAVENOUS

## 2024-05-30 MED ORDER — HYDROMORPHONE HCL 1 MG/ML IJ SOLN
INTRAMUSCULAR | Status: AC
  Filled 2024-05-30: qty 1

## 2024-05-30 MED ORDER — LIDOCAINE HCL (PF) 1 % IJ SOLN: 5.0000 mL | INTRAMUSCULAR | Status: DC | PRN

## 2024-05-30 MED ORDER — HEPARIN SODIUM (PORCINE) 1000 UNIT/ML IJ SOLN
INTRAMUSCULAR | Status: AC
  Filled 2024-05-30: qty 1

## 2024-05-30 MED ORDER — CHLORHEXIDINE GLUCONATE 0.12 % MT SOLN
OROMUCOSAL | Status: AC
  Administered 2024-05-30: 15 mL via OROMUCOSAL
  Filled 2024-05-30: qty 15

## 2024-05-30 MED ORDER — SODIUM CHLORIDE 0.9 % IV SOLN: INTRAVENOUS | Status: DC

## 2024-05-30 MED ORDER — HEPARIN SODIUM (PORCINE) 1000 UNIT/ML DIALYSIS: 1500.0000 [IU] | INTRAMUSCULAR | Status: DC | PRN

## 2024-05-30 MED ORDER — HEPARIN SODIUM (PORCINE) 1000 UNIT/ML DIALYSIS: 1000.0000 [IU] | INTRAMUSCULAR | Status: DC | PRN

## 2024-05-30 MED ORDER — PHENYLEPHRINE HCL-NACL 20-0.9 MG/250ML-% IV SOLN
INTRAVENOUS | Status: DC | PRN
  Administered 2024-05-30: 50 ug/min via INTRAVENOUS

## 2024-05-30 MED ORDER — ORAL CARE MOUTH RINSE: 15.0000 mL | Freq: Once | OROMUCOSAL | Status: AC

## 2024-05-30 MED ORDER — CEFAZOLIN SODIUM-DEXTROSE 2-4 GM/100ML-% IV SOLN
2.0000 g | INTRAVENOUS | Status: AC
  Administered 2024-05-30: 2 g via INTRAVENOUS
  Filled 2024-05-30: qty 100

## 2024-05-30 MED ORDER — MIDAZOLAM HCL 2 MG/2ML IJ SOLN
INTRAMUSCULAR | Status: DC | PRN
  Administered 2024-05-30: 2 mg via INTRAVENOUS

## 2024-05-30 MED ORDER — HYDROMORPHONE HCL 1 MG/ML IJ SOLN
0.5000 mg | INTRAMUSCULAR | Status: DC | PRN
Start: 2024-05-30 — End: 2024-05-31
  Administered 2024-05-30 – 2024-05-31 (×5): 0.5 mg via INTRAVENOUS
  Filled 2024-05-30 (×5): qty 0.5

## 2024-05-30 MED ORDER — OXYCODONE HCL 5 MG/5ML PO SOLN: 5.0000 mg | Freq: Once | ORAL | Status: DC | PRN

## 2024-05-30 MED ORDER — AMISULPRIDE (ANTIEMETIC) 5 MG/2ML IV SOLN: 10.0000 mg | Freq: Once | INTRAVENOUS | Status: DC | PRN

## 2024-05-30 MED ORDER — PREDNISONE 20 MG PO TABS
ORAL_TABLET | ORAL | Status: AC
  Filled 2024-05-30: qty 1

## 2024-05-30 MED ORDER — HYDROMORPHONE HCL 1 MG/ML IJ SOLN
0.2500 mg | INTRAMUSCULAR | Status: DC | PRN
  Administered 2024-05-30: 0.5 mg via INTRAVENOUS

## 2024-05-30 MED ORDER — CEFAZOLIN SODIUM-DEXTROSE 2-4 GM/100ML-% IV SOLN
INTRAVENOUS | Status: AC
Start: 2024-05-30 — End: 2024-05-30
  Filled 2024-05-30: qty 100

## 2024-05-30 MED ORDER — CHLORHEXIDINE GLUCONATE 0.12 % MT SOLN: 15.0000 mL | Freq: Once | OROMUCOSAL | Status: AC

## 2024-05-30 MED ORDER — ROCURONIUM BROMIDE 10 MG/ML (PF) SYRINGE
PREFILLED_SYRINGE | INTRAVENOUS | Status: DC | PRN
  Administered 2024-05-30: 40 mg via INTRAVENOUS

## 2024-05-30 MED ORDER — OXYCODONE HCL 5 MG PO TABS
2.5000 mg | ORAL_TABLET | Freq: Once | ORAL | Status: AC
  Administered 2024-05-30: 2.5 mg via ORAL
  Filled 2024-05-30: qty 1

## 2024-05-30 MED ORDER — OXYCODONE HCL 5 MG PO TABS
2.5000 mg | ORAL_TABLET | Freq: Once | ORAL | Status: AC
  Administered 2024-05-31: 2.5 mg via ORAL
  Filled 2024-05-30: qty 1

## 2024-05-30 MED ORDER — ALTEPLASE 2 MG IJ SOLR: 2.0000 mg | Freq: Once | INTRAMUSCULAR | Status: DC | PRN

## 2024-05-30 MED ORDER — HYDROCORTISONE (PERIANAL) 2.5 % EX CREA
TOPICAL_CREAM | Freq: Four times a day (QID) | CUTANEOUS | Status: DC
  Administered 2024-05-30: 1 via RECTAL
  Filled 2024-05-30: qty 28.35

## 2024-05-30 MED ORDER — HEPARIN SODIUM (PORCINE) 1000 UNIT/ML IJ SOLN
INTRAMUSCULAR | Status: AC
  Filled 2024-05-30: qty 10

## 2024-05-30 MED ORDER — HEPARIN SODIUM (PORCINE) 1000 UNIT/ML DIALYSIS: 40.0000 [IU]/kg | INTRAMUSCULAR | Status: DC | PRN

## 2024-05-30 MED ORDER — HYDROMORPHONE HCL 1 MG/ML IJ SOLN
INTRAMUSCULAR | Status: DC | PRN
  Administered 2024-05-30 (×2): .5 mg via INTRAVENOUS

## 2024-05-30 MED ORDER — LIDOCAINE-PRILOCAINE 2.5-2.5 % EX CREA: 1.0000 | TOPICAL_CREAM | CUTANEOUS | Status: DC | PRN

## 2024-05-30 MED ORDER — OXYCODONE HCL 5 MG PO TABS: 5.0000 mg | ORAL_TABLET | Freq: Once | ORAL | Status: DC | PRN

## 2024-05-30 MED ORDER — DIPHENHYDRAMINE HCL 50 MG/ML IJ SOLN
INTRAMUSCULAR | Status: AC
  Administered 2024-05-30: 50 mg via INTRAVENOUS
  Filled 2024-05-30: qty 1

## 2024-05-30 MED ORDER — PENTAFLUOROPROP-TETRAFLUOROETH EX AERO: 1.0000 | INHALATION_SPRAY | CUTANEOUS | Status: DC | PRN

## 2024-05-30 MED ORDER — ANTICOAGULANT SODIUM CITRATE 4% (200MG/5ML) IV SOLN: 5.0000 mL | Status: DC | PRN

## 2024-05-30 MED ORDER — FENTANYL CITRATE (PF) 100 MCG/2ML IJ SOLN
INTRAMUSCULAR | Status: AC
  Filled 2024-05-30: qty 2

## 2024-05-30 MED ORDER — MEPERIDINE HCL 25 MG/ML IJ SOLN: 6.2500 mg | INTRAMUSCULAR | Status: DC | PRN

## 2024-05-30 MED ORDER — HEPARIN SODIUM (PORCINE) 1000 UNIT/ML DIALYSIS
2500.0000 [IU] | Freq: Once | INTRAMUSCULAR | Status: AC
  Administered 2024-05-31: 2500 [IU] via INTRAVENOUS_CENTRAL

## 2024-05-30 MED ORDER — LIDOCAINE-EPINEPHRINE 1 %-1:100000 IJ SOLN
20.0000 mL | Freq: Once | INTRAMUSCULAR | Status: DC
Start: 2024-05-30 — End: 2024-05-31
  Filled 2024-05-30: qty 20

## 2024-05-30 MED ORDER — MIDAZOLAM HCL 2 MG/2ML IJ SOLN
INTRAMUSCULAR | Status: AC
  Filled 2024-05-30: qty 2

## 2024-05-30 MED ORDER — LIDOCAINE 2% (20 MG/ML) 5 ML SYRINGE
INTRAMUSCULAR | Status: DC | PRN
  Administered 2024-05-30: 100 mg via INTRAVENOUS

## 2024-05-30 MED ORDER — PROPOFOL 10 MG/ML IV BOLUS
INTRAVENOUS | Status: DC | PRN
  Administered 2024-05-30: 150 mg via INTRAVENOUS

## 2024-05-30 MED ORDER — HEPARIN SODIUM (PORCINE) 1000 UNIT/ML IJ SOLN
INTRAMUSCULAR | Status: DC | PRN
  Administered 2024-05-30: 3000 [IU] via INTRAVENOUS

## 2024-05-30 NOTE — Anesthesia Preprocedure Evaluation (Addendum)
 Anesthesia Evaluation  Patient identified by MRN, date of birth, ID band Patient awake    Reviewed: Allergy & Precautions, H&P , NPO status , Patient's Chart, lab work & pertinent test results  History of Anesthesia Complications Negative for: history of anesthetic complications  Airway Mallampati: I       Dental  (+) Dental Advisory Given   Pulmonary neg recent URI, Current Smoker and Patient abstained from smoking.   breath sounds clear to auscultation       Cardiovascular hypertension,  Rhythm:Regular Rate:Normal  TTE 2025: 1. Left ventricular ejection fraction, by estimation, is 30 to 35%. The  left ventricle has moderately decreased function. The left ventricle  demonstrates global hypokinesis. The left ventricular internal cavity size  was moderately dilated. There is mild   left ventricular hypertrophy. Left ventricular diastolic parameters are  indeterminate.   2. Right ventricular systolic function is normal. The right ventricular  size is normal.   3. Left atrial size was moderately dilated.   4. Right atrial size was moderately dilated.   5. The mitral valve is abnormal. Mild mitral valve regurgitation. No  evidence of mitral stenosis.   6. Tricuspid valve regurgitation is moderate.   7. The aortic valve is normal in structure. Aortic valve regurgitation is  not visualized. No aortic stenosis is present.   8. The inferior vena cava is normal in size with greater than 50%  respiratory variability, suggesting right atrial pressure of 3 mmHg.     Neuro/Psych negative neurological ROS  negative psych ROS   GI/Hepatic Neg liver ROS,GERD  ,,  Endo/Other  negative endocrine ROSType 2    Renal/GU ESRF and DialysisRenal disease  negative genitourinary   Musculoskeletal negative musculoskeletal ROS (+)    Abdominal   Peds negative pediatric ROS (+)  Hematology negative hematology ROS (+)   Anesthesia Other  Findings SLE, Complex Pain Syndrome  Reproductive/Obstetrics negative OB ROS                              Anesthesia Physical Anesthesia Plan  ASA: 3  Anesthesia Plan: General   Post-op Pain Management:    Induction: Intravenous  PONV Risk Score and Plan: 2 and Ondansetron , Midazolam  and Treatment may vary due to age or medical condition  Airway Management Planned: Oral ETT  Additional Equipment: Arterial line and ClearSight  Intra-op Plan:   Post-operative Plan: Extubation in OR  Informed Consent:      Dental advisory given  Plan Discussed with:   Anesthesia Plan Comments:          Anesthesia Quick Evaluation

## 2024-05-30 NOTE — Care Management Obs Status (Signed)
 MEDICARE OBSERVATION STATUS NOTIFICATION   Patient Details  Name: Linda [name] MRN: [id number] Date of Birth: [birth year]   Medicare Observation Status Notification Given:  Yes    Tom-Johnson, Harvest Muskrat, RN 05/30/2024, 4:30 PM

## 2024-05-30 NOTE — Sedation Documentation (Signed)
 Patient transported to PACU with CRNA.

## 2024-05-30 NOTE — Consult Note (Signed)
 Chief Complaint: Malfunction of LUE AVG  Referring Provider(s): Dr. Geralynn  Supervising Physician: Philip Cornet  Patient Status: [facility] - In-pt  History of Present Illness: Linda [name] is a 28 y.o. female with a history of HTN and Lupus with ESRD on HD via LUE graft created in 2020 by Dr. Marea. She presented to the ED 05/29/24 for clotted AVG LEU. She is known to IR from previous declot and TDC procedures. Most recent declot procedure 05/23/24 with Dr. Jenna. IR consulted to address malfunction of HD access.   Confirms NPO since MN.   She wears supplemental O2 at baseline. During a previous procedure, she was noted to tolerate moderate sedation poorly. Today's procedure will be with the help of the anesthesia team with general.  Denies fever, chills, SOB, CP, sore throat, N/V, blood in stool or urine, abnormal bruising, leg swelling, back pain. She does endorse abd distention and discomfort and states I can tell I am overloaded.  Allergies Reviewed:  Iodine , Lisinopril , Gabapentin, Ativan  [lorazepam ], Hydroxychloroquine , and Vicodin [hydrocodone -acetaminophen ]   Patient is Full Code  Past Medical History:  Diagnosis Date   Abnormal ECG    a. In setting of LVH w/ repolarization abnormalities; b. 03/2019 MV: No ischemia/infarct. EF 55-65%.   Abscess of buttock, right 01/11/2024   Acute hypoxic respiratory failure (HCC) 10/10/2023   Acute on chronic heart failure with mildly reduced ejection fraction (HFmrEF, 41-49%) (HCC) 10/10/2023   Acute pulmonary edema (HCC) 09/30/2021   Anaphylactic shock, unspecified, initial encounter 10/02/2021   Angioedema    Aspiration pneumonia (HCC) 03/07/2024   Encounter for screening for respiratory tuberculosis 10/02/2021   ESRD on hemodialysis (HCC)    G6PD deficiency    GERD (gastroesophageal reflux disease)    High anion gap metabolic acidosis 09/02/2021   HUS (hemolytic uremic syndrome), atypical (HCC) 05/04/2018   Hypertension     Hypertensive urgency 11/29/2017   Infection due to Strongyloides 05/28/2021   Lupus    LVH (left ventricular hypertrophy)    a. 11/2017 Echo: EF 50-55%. triv AI. Mild MR. Mod L and mild R atrial enlargement. Nl RV size/fxn. Small pericardial effusion w/o tampondae; b. 03/2018 Echo: EF 55-60%, restrictive filling pattern. Nl RV size/fxn. Sev LAE. Mild MR, mild to mod TR/PR. Mod PAH. Triv effusion.   Migraines    Mixed connective tissue disease    Positive D dimer 12/22/2023   Previous Dialysis patient [facility])    a. Off HD since late 2020.   Resistant hypertension    Septic prepatellar bursitis of right knee 08/19/2017   Severe sepsis (HCC) 01/06/2018    Past Surgical History:  Procedure Laterality Date   A/V FISTULAGRAM N/A 03/10/2024   Procedure: A/V Fistulagram;  Surgeon: Serene Gaile ORN, MD;  Location: [facility] INVASIVE CV LAB;  Service: Cardiovascular;  Laterality: N/A;   GRAFT APPLICATION Left 03/30/2019   Procedure: BRACHIAL CEPHALIC GRAFT CREATION;  Surgeon: Marea Selinda RAMAN, MD;  Location: [facility] ORS;  Service: Vascular;  Laterality: Left;   I & D EXTREMITY Right 08/06/2017   Procedure: IRRIGATION AND DEBRIDEMENT KNEE;  Surgeon: Kendal Franky SQUIBB, MD;  Location: [facility] OR;  Service: Orthopedics;  Laterality: Right;   IR AV DIALY SHUNT INTRO NEEDLE/INTRACATH INITIAL W/PTA/IMG LEFT  05/23/2024   IR REMOVAL TUN CV CATH W/O FL  05/24/2024   IR THROMBECTOMY AV FISTULA W/THROMBOLYSIS/PTA/STENT INC/SHUNT/IMG LT Left 05/18/2022   IR TUNNELED CENTRAL VENOUS CATH PLC W IMG  05/19/2024   IR US  GUIDE VASC ACCESS LEFT  05/18/2022   IR US  GUIDE VASC ACCESS LEFT  05/23/2024   MULTIPLE TOOTH EXTRACTIONS     RADIOLOGY WITH ANESTHESIA N/A 05/23/2024   Procedure: RADIOLOGY WITH ANESTHESIA;  Surgeon: Jennefer Ester PARAS, MD;  Location: [facility] OR;  Service: Radiology;  Laterality: N/A;      Medications: Prior to Admission medications   Medication Sig Start Date End Date Taking? Authorizing Provider  acetaminophen  (TYLENOL ) 325  MG tablet Take 2 tablets (650 mg total) by mouth every 6 (six) hours. 03/11/24   Elgergawy, Brayton RAMAN, MD  allopurinol  (ZYLOPRIM ) 100 MG tablet Take 1 tablet (100 mg total) by mouth daily. 03/11/24   Elgergawy, Brayton RAMAN, MD  amLODipine  (NORVASC ) 10 MG tablet Take 1 tablet (10 mg total) by mouth at bedtime. 05/24/24   Laurence Locus, DO  Blood Pressure KIT Uncontrolled blood pressure 03/11/24   Elgergawy, Brayton RAMAN, MD  Blood Pressure Monitor MISC Use to check blood pressure 03/11/24   Elgergawy, Brayton RAMAN, MD  carvedilol  (COREG ) 25 MG tablet Take 1 tablet (25 mg total) by mouth 2 (two) times daily with a meal. 05/17/24   [name], NP  cloNIDine  (CATAPRES ) 0.1 MG tablet Take 1 tablet (0.1 mg total) by mouth daily. 03/11/24   Elgergawy, Brayton RAMAN, MD  doxazosin  (CARDURA ) 2 MG tablet Take 1 tablet (2 mg total) by mouth daily. 05/24/24 06/23/24  Laurence Locus, DO  furosemide  (LASIX ) 80 MG tablet Take 1 tablet (80 mg total) by mouth daily. 12/23/23   Fairy Frames, MD  irbesartan  (AVAPRO ) 300 MG tablet Take 1 tablet (300 mg total) by mouth daily. 05/17/24   Williamson, Joanna R, NP  lidocaine -prilocaine  (EMLA ) cream Apply 1 Application topically daily as needed (for dialysis). 01/14/24   [provider]  magic mouthwash (lidocaine , diphenhydrAMINE , alum & mag hydroxide) suspension Swish and spit 5 mLs 4 (four) times daily as needed for mouth pain. 05/25/24   Laurence Locus, DO  nitroGLYCERIN  (NITROSTAT ) 0.4 MG SL tablet Place 1 tablet (0.4 mg total) under the tongue every 5 (five) minutes as needed for chest pain. Patient not taking: Reported on 05/26/2024 10/28/23   Rai, Nydia POUR, MD  ondansetron  (ZOFRAN ) 4 MG tablet Take 1 tablet (4 mg total) by mouth every 6 (six) hours. 04/06/24   Robinson, John K, PA-C  sevelamer  carbonate (RENVELA ) 2.4 g PACK Take 2.4 g by mouth daily. 01/21/24   [provider]  tiZANidine  (ZANAFLEX ) 4 MG tablet Take 1 tablet (4 mg total) by mouth daily as needed for muscle  spasms. 05/17/24   [name], NP     Family History  Problem Relation Age of Onset   Lupus Mother    Hypertension Mother    Kidney disease Mother    Heart Problems Mother    Coronary artery disease Mother 74       stent   Diabetes Maternal Grandmother     Social History   Socioeconomic History   Marital status: Single    Spouse name: Not on file   Number of children: 0   Years of education: Not on file   Highest education level: Some college, no degree  Occupational History   Not on file  Tobacco Use   Smoking status: Every Day    Types: E-cigarettes, Cigars   Smokeless tobacco: Never   Tobacco comments:    02/03/2024 Patient smokes 1 cigars daily since 2016  Vaping Use   Vaping status: Every Day  Substance and Sexual Activity  Alcohol use: No    Alcohol/week: 0.0 standard drinks of alcohol   Drug use: No   Sexual activity: Yes    Partners: Male    Birth control/protection: None  Other Topics Concern   Not on file  Social History Narrative   Lives with boyfriend   Social Drivers of Health   Financial Resource Strain: Low Risk  (02/03/2024)   Overall Financial Resource Strain (CARDIA)    Difficulty of Paying Living Expenses: Not hard at all  Food Insecurity: No Food Insecurity (05/30/2024)   Hunger Vital Sign    Worried About Running Out of Food in the Last Year: Never true    Ran Out of Food in the Last Year: Never true  Transportation Needs: No Transportation Needs (05/30/2024)   PRAPARE - Administrator, Civil Service (Medical): No    Lack of Transportation (Non-Medical): No  Physical Activity: Insufficiently Active (02/03/2024)   Exercise Vital Sign    Days of Exercise per Week: 2 days    Minutes of Exercise per Session: 10 min  Stress: No Stress Concern Present (02/03/2024)   [employer] of Occupational Health - Occupational Stress Questionnaire    Feeling of Stress : Not at all  Social Connections: Socially Isolated (02/03/2024)    Social Connection and Isolation Panel    Frequency of Communication with Friends and Family: Never    Frequency of Social Gatherings with Friends and Family: Once a week    Attends Religious Services: Never    Database administrator or Organizations: Yes    Attends Banker Meetings: Never    Marital Status: Never married     Review of Systems: A 12 point ROS discussed and pertinent positives are indicated in the HPI above.  All other systems are negative.    Vital Signs: BP (!) 147/109 (BP Location: Right Arm)   Pulse 71   Temp 97.9 F (36.6 C)   Resp 18   Ht 5' 6 (1.676 m)   Wt 165 lb (74.8 kg)   SpO2 100%   BMI 26.63 kg/m     Physical Exam HENT:     Mouth/Throat:     Mouth: Mucous membranes are moist.     Pharynx: Oropharynx is clear.  Cardiovascular:     Rate and Rhythm: Normal rate and regular rhythm.     Pulses: Normal pulses.     Heart sounds: Normal heart sounds.  Pulmonary:     Effort: Pulmonary effort is normal.     Breath sounds: Normal breath sounds.     Comments: Wearing [place] Abdominal:     General: There is distension.     Palpations: Abdomen is soft.     Comments: Minimal tenderness generalized  Musculoskeletal:     Comments: 1+ BLE edema  Skin:    General: Skin is warm and dry.  Neurological:     Mental Status: She is alert and oriented to person, place, and time.  Psychiatric:        Mood and Affect: Mood normal.        Behavior: Behavior normal.     Imaging: VAS US  DUPLEX DIALYSIS ACCESS (AVF, AVG) Result Date: 05/29/2024 DIALYSIS ACCESS Patient Name:  Linda [name]  Date of Exam:   05/29/2024 Medical Rec #: [id number]              Accession #:    [id number] Date of Birth: Apr 12, 1996  Patient Gender: F Patient Age:   66 years Exam Location:  [facility] Procedure:      VAS US  DUPLEX DIALYSIS ACCESS (AVF, AVG) Referring Phys: MELANIE BELFI  --------------------------------------------------------------------------------  Reason for Exam: No palpable thrill for AVF/AVG. Access Site: Left Upper Extremity. Access Type: Brachial-cephalic with axillary stent. History: Patient had thrombectomy of left upper arm AV graft by interventional          radiology 05/23/24. Patient had successful dialysis treatment Saturday,          9/17, but could not complete dialysis today because fistula was not          working. Patient has significant pain in left upper extremity. Limitations: Pain, tissue properties Comparison Study: Prior study indicating occluded axillary stent done 05/19/2024 Performing Technologist: Alberta Lis RVS  Examination Guidelines: A complete evaluation includes B-mode imaging, spectral Doppler, color Doppler, and power Doppler as needed of all accessible portions of each vessel. Unilateral testing is considered an integral part of a complete examination. Limited examinations for reoccurring indications may be performed as noted.    Summary: Arteriovenous fistula-Thrombus noted.  Occluded left axillary stent. Abnormal left radial and ulnar arterial flow.  Diagnosing physician: Debby Robertson Electronically signed by Debby Robertson on 05/29/2024 at 5:10:49 PM.    --------------------------------------------------------------------------------   Final    DG Chest 2 View Result Date: 05/29/2024 CLINICAL DATA:  SOB EXAM: CHEST - 2 VIEW COMPARISON:  05/19/2024 FINDINGS: Mild central pulmonary vascular congestion. No focal airspace consolidation, pleural effusion, or pneumothorax. Moderate cardiomegaly.No acute fracture or destructive lesion. Partially visualized vascular stent in the left axilla. IMPRESSION: Moderate cardiomegaly with central pulmonary vascular congestion. Electronically Signed   By: Rogelia Myers M.D.   On: 05/29/2024 09:34   IR US  Guide Vasc Access Left Result Date: 05/23/2024 INDICATION: 28 year old female with end-stage  renal disease requiring hemodialysis with malfunctioning and occluded left upper extremity arteriovenous graft. EXAM: IR ULTRASOUND GUIDANCE VASC ACCESS LEFT; CV CATH DECLOT W/THROMBOLYTIC DWELL COMPARISON:  None Available. MEDICATIONS: None. CONTRAST:  50 mL Omnipaque  300, intravenous ANESTHESIA/SEDATION: The procedure was performed under general anesthesia. FLUOROSCOPY TIME:  19.9 mGy reference air kerma COMPLICATIONS: None immediate. PROCEDURE: Informed written consent was obtained from the patient after a discussion of the risk, benefits and alternatives to treatment. Questions regarding the procedure were encouraged and answered. A timeout was performed prior to the initiation of the procedure. The left arm dialysis graft was prepped with Chlorhexidine  in a sterile fashion, and a sterile drape was applied covering the operative field. Preprocedure ultrasound evaluation demonstrated patency of the brachial artery with occlusion from the arterial anastomosis through the puncture zone in venous outflow of the visualized upper extremity graft. The procedure was planned. Subdermal Local anesthesia was administered at the planned needle entry sites for antegrade and retrograde access with 1% lidocaine . Under direct ultrasound visualization, a 21 gauge micropuncture needle was inserted into the peripheral aspect of the graft near the arterial anastomosis. A permanent ultrasound image was captured and stored in the record. A micropuncture wire was inserted over which the wire was exchanged for micropuncture sheath. A Wholey wire was then directed to the central veins in the micropuncture sheath was exchanged for a 6 French short vascular sheath. Over the wire, a Kumpe the catheter was directed to the level of the left innominate vein. The wire was removed and contrast was injected while retracting the catheter which demonstrated patency of the left innominate, left subclavian, and left axillary veins. There  was  thrombus present in the indwelling central stent within the venous outflow of the graft near the axillary anastomosis. The catheter was readvanced and a total of 8 mg tPA was administered while retracting the catheter through the thrombus to the level of the puncture zone. During the tPA dwell time, ultrasound-guided retrograde access was then obtained with a 21 gauge micropuncture needle. A permanent ultrasound image was captured and stored in the record. A micropuncture wire was then placed over which a micropuncture sheath was inserted. A Wholey wire was then directed into the brachial artery in retrograde fashion over which a Kumpe the catheter was placed. Contrast injection demonstrated patency of the brachial artery with persistent occlusion of the arterial anastomosis. From this location, a total of 3 Fogarty balloon catheter passes were made to free the arterial anastomotic plug. The wire was removed. From the antegrade access, a 6 French cleaner device was advanced to the indwelling axillary anastomotic stent. The device was used into separate retraction pulls to the indwelling antegrade sheath. This was followed by balloon angioplasty with a 7 mm x 40 mm Athletis balloon in multiple stations extending from the indwelling axial anastomosis stent to the puncture zone. Balloon was removed. Through the antegrade sheath, completion fistulagram was performed which demonstrated brisk antegrade flow without significant filling defect through the puncture zone and venous outflow. The indwelling stent appeared widely patent. The central veins were widely patent. The sheath was removed and hemostasis obtained with application of a 0 Prolene pursestring suture which will be removed at the patient's next dialysis session. The indwelling left tunneled hemodialysis catheter was then prepped and draped in standard fashion. The retention sutures were removed. The catheter was then removed intact without necessity for blunt  or sharp dissection. Sterile dressings were placed. The patient tolerated the procedure well without immediate postprocedural complication. IMPRESSION: 1. Technically successful pharmacomechanical declot of left upper extremity brachial basilic arteriovenous graft. 2. Technically successful removal of left IJ tunneled hemodialysis catheter. ACCESS: This access remains amenable to future percutaneous interventions as clinically indicated. Ester Sides, MD [facility] Electronically Signed   By: Ester Sides M.D.   On: 05/23/2024 22:30   IR AV DIALY SHUNT INTRO NEEDLE/INTRACATH INITIAL W/PTA/IMG LEFT Result Date: 05/23/2024 INDICATION: 28 year old female with end-stage renal disease requiring hemodialysis with malfunctioning and occluded left upper extremity arteriovenous graft. EXAM: IR ULTRASOUND GUIDANCE VASC ACCESS LEFT; CV CATH DECLOT W/THROMBOLYTIC DWELL COMPARISON:  None Available. MEDICATIONS: None. CONTRAST:  50 mL Omnipaque  300, intravenous ANESTHESIA/SEDATION: The procedure was performed under general anesthesia. FLUOROSCOPY TIME:  19.9 mGy reference air kerma COMPLICATIONS: None immediate. PROCEDURE: Informed written consent was obtained from the patient after a discussion of the risk, benefits and alternatives to treatment. Questions regarding the procedure were encouraged and answered. A timeout was performed prior to the initiation of the procedure. The left arm dialysis graft was prepped with Chlorhexidine  in a sterile fashion, and a sterile drape was applied covering the operative field. Preprocedure ultrasound evaluation demonstrated patency of the brachial artery with occlusion from the arterial anastomosis through the puncture zone in venous outflow of the visualized upper extremity graft. The procedure was planned. Subdermal Local anesthesia was administered at the planned needle entry sites for antegrade and retrograde access with 1%  lidocaine . Under direct ultrasound visualization, a 21 gauge micropuncture needle was inserted into the peripheral aspect of the graft near the arterial anastomosis. A permanent ultrasound image was captured and stored in the record.  A micropuncture wire was inserted over which the wire was exchanged for micropuncture sheath. A Wholey wire was then directed to the central veins in the micropuncture sheath was exchanged for a 6 French short vascular sheath. Over the wire, a Kumpe the catheter was directed to the level of the left innominate vein. The wire was removed and contrast was injected while retracting the catheter which demonstrated patency of the left innominate, left subclavian, and left axillary veins. There was thrombus present in the indwelling central stent within the venous outflow of the graft near the axillary anastomosis. The catheter was readvanced and a total of 8 mg tPA was administered while retracting the catheter through the thrombus to the level of the puncture zone. During the tPA dwell time, ultrasound-guided retrograde access was then obtained with a 21 gauge micropuncture needle. A permanent ultrasound image was captured and stored in the record. A micropuncture wire was then placed over which a micropuncture sheath was inserted. A Wholey wire was then directed into the brachial artery in retrograde fashion over which a Kumpe the catheter was placed. Contrast injection demonstrated patency of the brachial artery with persistent occlusion of the arterial anastomosis. From this location, a total of 3 Fogarty balloon catheter passes were made to free the arterial anastomotic plug. The wire was removed. From the antegrade access, a 6 French cleaner device was advanced to the indwelling axillary anastomotic stent. The device was used into separate retraction pulls to the indwelling antegrade sheath. This was followed by balloon angioplasty with a 7 mm x 40 mm Athletis balloon in multiple  stations extending from the indwelling axial anastomosis stent to the puncture zone. Balloon was removed. Through the antegrade sheath, completion fistulagram was performed which demonstrated brisk antegrade flow without significant filling defect through the puncture zone and venous outflow. The indwelling stent appeared widely patent. The central veins were widely patent. The sheath was removed and hemostasis obtained with application of a 0 Prolene pursestring suture which will be removed at the patient's next dialysis session. The indwelling left tunneled hemodialysis catheter was then prepped and draped in standard fashion. The retention sutures were removed. The catheter was then removed intact without necessity for blunt or sharp dissection. Sterile dressings were placed. The patient tolerated the procedure well without immediate postprocedural complication. IMPRESSION: 1. Technically successful pharmacomechanical declot of left upper extremity brachial basilic arteriovenous graft. 2. Technically successful removal of left IJ tunneled hemodialysis catheter. ACCESS: This access remains amenable to future percutaneous interventions as clinically indicated. Ester Sides, MD [facility] Electronically Signed   By: Ester Sides M.D.   On: 05/23/2024 22:30   IR TUNNELED CENTRAL VENOUS CATH [facility] W IMG Addendum Date: 05/19/2024 ADDENDUM REPORT: 05/19/2024 17:26 ADDENDUM: Under exam the exam is a placement of a tunneled hemodialysis catheter with ultrasound and fluoroscopic guidance Electronically Signed   By: Cordella Banner   On: 05/19/2024 17:26   Result Date: 05/19/2024 INDICATION: Infiltrated left arm AV fistula causing exquisite pain in his now unusable for hemodialysis. Planned placement of a left IJ hemodialysis catheter as the right internal jugular vein is occluded. EXAM: TUNNELED PICC LINE WITH ULTRASOUND AND FLUOROSCOPIC GUIDANCE MEDICATIONS:  2 g Ancef . The antibiotic was given in an appropriate time interval prior to skin puncture. ANESTHESIA/SEDATION: Moderate (conscious) sedation was employed during this procedure. A total of Versed  4 mg and Fentanyl  200 mcg was administered intravenously by the radiology nurse. Total intra-service moderate Sedation Time: 23 minutes.  The patient's level of consciousness and vital signs were monitored continuously by radiology nursing throughout the procedure under my direct supervision. FLUOROSCOPY: Radiation Exposure Index (as provided by the fluoroscopic device): 49 mGy Kerma COMPLICATIONS: None immediate. PROCEDURE: Informed written consent was obtained from the patient after a discussion of the risks, benefits, and alternatives to treatment. Questions regarding the procedure were encouraged and answered. The right neck and chest were prepped with chlorhexidine  in a sterile fashion, and a sterile drape was applied covering the operative field. Maximum barrier sterile technique with sterile gowns and gloves were used for the procedure. A timeout was performed prior to the initiation of the procedure. After creating a small venotomy incision, a micropuncture kit was utilized to access the left internal jugular vein under direct, real-time ultrasound guidance after the overlying soft tissues were anesthetized with 1% lidocaine  with epinephrine . Ultrasound image documentation was performed. The microwire was kinked to measure appropriate catheter length. The micropuncture sheath was exchanged for a peel-away sheath over a guidewire. A 15 French tunneled hemodialysis catheter measuring 28 cm was prepped at the table. The catheter was then placed through the peel-away sheath with tip ultimately positioned at the superior caval-atrial junction. Final catheter positioning was confirmed and documented with a spot radiographic image. The catheter aspirates and flushes normally. The catheter was flushed with appropriate  volume heparin  dwells. The catheter exit site was secured with a 0-Prolene retention suture. The venotomy incision was closed with an interrupted 4-0 Vicryl, Dermabond and Steri-strips. Dressings were applied. The patient tolerated the procedure well without immediate post procedural complication. FINDINGS: After catheter placement, the tip lies within the superior cavoatrial junction. The catheter aspirates and flushes normally and is ready for immediate use. IMPRESSION: Successful placement 28 cm left-sided hemodialysis tunneled catheter. Electronically Signed: By: Cordella Banner On: 05/19/2024 17:21   VAS US  DUPLEX DIALYSIS ACCESS (AVF, AVG) Result Date: 05/19/2024 DIALYSIS ACCESS Patient Name:  Linda [name]  Date of Exam:   05/19/2024 Medical Rec #: [id number]              Accession #:    [id number] Date of Birth: 1995/11/19              Patient Gender: F Patient Age:   56 years Exam Location:  [facility] Procedure:      VAS US  DUPLEX DIALYSIS ACCESS (AVF, AVG) Referring Phys: MICHAEL PENNA --------------------------------------------------------------------------------  Reason for Exam: No palpable thrill for AVF/AVG. Access Site: Left Upper Extremity. Access Type: Brachial-Axillary. History: Stent in axilla. Pain during dialysis session on Wednesday after nurse          stuck fistula lower than normal. Patient halted session. Presents with          increased pain and swelling of left upper extremity and diminished          thrill. Limitations: Pain with touch and swelling Comparison Study: No prior study Performing Technologist: Alberta Lis RVS  Examination Guidelines: A complete evaluation includes B-mode imaging, spectral Doppler, color Doppler, and power Doppler as needed of all accessible portions of each vessel. Unilateral testing is considered an integral part of a complete examination. Limited examinations for reoccurring indications may be performed as noted.     Summary: Stent  within upper arm appears occluded. Minimal flow noted throughout left upper extremity fistula.   Diagnosing physician: Debby Robertson Electronically signed by Debby Robertson on 05/19/2024 at 3:40:32 PM.    --------------------------------------------------------------------------------   Final    DG  Chest Portable 1 View Result Date: 05/19/2024 CLINICAL DATA:  Left upper extremity pain and edema. History of dialysis. EXAM: PORTABLE CHEST 1 VIEW COMPARISON:  May 14, 2024. FINDINGS: Stable cardiomegaly. Both lungs are clear. The visualized skeletal structures are unremarkable. IMPRESSION: No active disease. Electronically Signed   By: Lynwood Landy Raddle M.D.   On: 05/19/2024 08:06   DG Chest 2 View Result Date: 05/14/2024 EXAM: 2 VIEW(S) XRAY OF THE CHEST 05/14/2024 05:25:32 AM COMPARISON: 04/07/2024 CLINICAL HISTORY: Pt in with reported sob and cough that began last night. Dialysis pt, last session Friday. Also reporting some L back pain. FINDINGS: LUNGS AND PLEURA: Pulmonary edema, mild. No focal pulmonary opacity. No pleural effusion. No pneumothorax. HEART AND MEDIASTINUM: Cardiomegaly. No acute abnormality of the cardiac and mediastinal silhouettes. BONES AND SOFT TISSUES: No acute osseous abnormality. Vascular stent in left upper arm. IMPRESSION: 1. Cardiomegaly. 2. Mild pulmonary edema. Electronically signed by: Waddell Calk MD 05/14/2024 05:59 AM EDT RP Workstation: [id number]    Labs:  CBC: Recent Labs    05/20/24 0541 05/21/24 0545 05/23/24 1343 05/23/24 1541 05/23/24 1704 05/29/24 0848 05/30/24 0453  WBC 7.6 6.3  --   --   --  7.1 8.0  HGB 10.1* 10.1*   < > 10.5* 10.9* 9.2* 9.3*  HCT 32.9* 32.4*   < > 31.0* 32.0* 29.1* 28.7*  PLT 182 157  --   --   --  235 260   < > = values in this interval not displayed.    COAGS: Recent Labs    10/10/23 0201 04/07/24 1215  INR 1.2 1.1  APTT 36  --     BMP: Recent Labs    05/21/24 0545 05/22/24 0444 05/23/24 1343 05/23/24 1541  05/23/24 1704 05/29/24 0848 05/30/24 0453  NA 131* 131*   < > 126* 125* 129* 125*  K 4.6 4.7   < > 5.8* 6.3* 4.8 5.4*  CL 96* 94*   < > 99 97* 91* 90*  CO2 22 21*  --   --   --  22 21*  GLUCOSE 84 85   < > 58* 82 81 159*  BUN 49* 67*   < > 87* 91* 51* 63*  CALCIUM  8.9 8.9  --   --   --  8.9 9.2  CREATININE 9.24* 11.15*   < > 11.90* 13.50* 8.64* 9.93*  GFRNONAA 5* 4*  --   --   --  6* 5*   < > = values in this interval not displayed.    LIVER FUNCTION TESTS: Recent Labs    05/14/24 0504 05/19/24 0757 05/20/24 0541 05/22/24 0444 05/30/24 0453  BILITOT 1.0 1.1 0.9  --  1.1  AST 24 24 18   --  24  ALT 13 16 13   --  13  ALKPHOS 69 80 80  --  117  PROT 7.6 7.2 7.0  --  7.0  ALBUMIN  3.2* 3.0* 2.9* 2.7* 2.9*    TUMOR MARKERS: No results for input(s): AFPTM, CEA, CA199, CHROMGRNA in the last 8760 hours.  Assessment and Plan:  IR will proceed with HD circuit study with possible intervention. Given recent frequent difficulties with graft, will place [facility] today as well regardless of circuit study results to ensure access.  Pt to receive premeds for procedure contrast No contraindications for procedure identified in ROS, physical exam, or review of pre-sedation considerations. Labs reviewed and within acceptable range. No pending BC.  VSS, afebrile Patient not asked to hold any  AC/AP for this low bleeding risk procedure Abx: ancef   2G  Risks and benefits of the dialysis circuit study were discussed with the patient including, but not limited to bleeding, infection, vascular injury, and inability to improve the function of the fistula/graft which would require placement of a tunneled dialysis catheter.  Risks and benefits of tunneled HD cath discussed with the patient including, but not limited to bleeding, infection, vascular injury, pneumothorax which may require chest tube placement, air embolism or even death  All of the patient's questions were answered, patient is  agreeable to proceed. Consent signed and in chart.   Thank you for allowing our service to participate in Lalania Izola Teague 's care.    Electronically Signed: Laymon Coast, NP   05/30/2024, 8:01 AM     I spent a total of 20 Minutes   in face to face in clinical consultation, greater than 50% of which was counseling/coordinating care for image guided HD circuit study of LUE graft with possible intervention and TDC placement.    (A copy of this note was sent to the referring provider and the time of visit.)

## 2024-05-30 NOTE — Telephone Encounter (Signed)
 Copied from CRM [id number]. Topic: Referral - Question >> May 30, 2024  1:33 PM Burnard DEL wrote: Reason for CRM: Patient was able to speak to someone from the pain clinic finally and they told her that they do not have a referral for her that was sent over by [facility] .Patient stated that they are scheduling for November however they would need the referral and medical records to be resent to get patient scheduled.

## 2024-05-30 NOTE — Sedation Documentation (Signed)
Bedside report given to RN.

## 2024-05-30 NOTE — Telephone Encounter (Signed)
 Copied from CRM #[id number]. Topic: Referral - Question >> May 30, 2024  1:29 PM Burnard DEL wrote: Reason for CRM: Patient called in stating that she has been trying to  get in touch with [facility] ,however she has not had ant luck.She hasn't been able to speak with anyone or able to get a phone call back.She would like to know if provider or medical assistant could try to get in contact with them on her behalf?She is currently in the hospital and stated that she has been in and out of the hospital because she's not able to get the pain medications that she's needing.

## 2024-05-30 NOTE — Progress Notes (Signed)
 Dialysis staff called for the patient. Waited over 30 minutes for the patient and patient ultimately refused to come to HD. Provider [place] notified.

## 2024-05-30 NOTE — Progress Notes (Signed)
 Central monitoring updated on patient's off floor status

## 2024-05-30 NOTE — TOC CM/SW Note (Signed)
 Transition of Care [facility]) - Inpatient Brief Assessment   Patient Details  Name: Linda [name] MRN: [id number] Date of Birth: March 24, 1996  Transition of Care [facility]) CM/SW Contact:    Tom-Johnson, Harvest Muskrat, RN Phone Number: 05/30/2024, 4:39 PM   Clinical Narrative:  Patient presented to the ED with AV Graft Malfunction. Underwent Lt Arm AV Graft Declot today 05/30/24 by IR. Patient was recently discharged from the Hospital with Inability to access Fistula. Hx of ESRD on MWF outpatient HD schedule.   No ICM needs or recommendations noted at this time.  Patient not Medically ready for discharge.  CM will continue to follow as patient progresses with care towards discharge.           Transition of Care Asessment: Insurance and Status: Insurance coverage has been reviewed Patient has primary care physician: Yes Home environment has been reviewed: Yes Prior level of function:: Independent Prior/Current Home Services: No current home services Social Drivers of Health Review: SDOH reviewed no interventions necessary Readmission risk has been reviewed: Yes Transition of care needs: no transition of care needs at this time

## 2024-05-30 NOTE — Progress Notes (Cosign Needed Addendum)
 [facility] Progress Note   Subjective:  S/p AVG declot this AM. Tried to bring her for dialysis and she refused, then agreeable after discussion/risks, but now refusing again. Endorses rectal/hemorrhoidal pain and wants to leave. She does have excess fluid but is on room air.  Wants to discharge home and go to her usual outpatient HD in the AM - will d/w her primary team.  Objective Vitals:   05/30/24 1145 05/30/24 1200 05/30/24 1208 05/30/24 1225  BP: (!) 140/113 (!) 146/115 (!) 152/115 (!) 147/106  Pulse: 65 63 61 63  Resp: 20 14 19 20   Temp:   97.7 F (36.5 C)   TempSrc:      SpO2: 94% 94% 94% 100%  Weight:      Height:       Physical Exam General: Crying, not in respiratory distress Heart: RRR Lungs: Scattered rhonchi, no rales Abdomen: soft Extremities: 2+ BLE edema Dialysis Access: LUE AVG + bruit  Additional Objective Labs: Basic Metabolic Panel: Recent Labs  Lab 05/23/24 1704 05/29/24 0848 05/30/24 0453  NA 125* 129* 125*  K 6.3* 4.8 5.4*  CL 97* 91* 90*  CO2  --  22 21*  GLUCOSE 82 81 159*  BUN 91* 51* 63*  CREATININE 13.50* 8.64* 9.93*  CALCIUM   --  8.9 9.2   Liver Function Tests: Recent Labs  Lab 05/30/24 0453  AST 24  ALT 13  ALKPHOS 117  BILITOT 1.1  PROT 7.0  ALBUMIN  2.9*   CBC: Recent Labs  Lab 05/23/24 1704 05/29/24 0848 05/30/24 0453  WBC  --  7.1 8.0  HGB 10.9* 9.2* 9.3*  HCT 32.0* 29.1* 28.7*  MCV  --  96.7 94.7  PLT  --  235 260   Studies/Results: IR US  Guide Vasc Access Left Result Date: 05/30/2024 RESULT     INDICATION: End-stage renal disease with a thrombosed left upper extremity AV graft. Graft was recently declotted on 05/23/2024. EXAM: 1. Declot of left upper extremity AV graft with thrombolytics, mechanical thrombectomy and balloon angioplasty. 2. Ultrasound guidance for vascular access Physician: Juliene SAUNDERS. Philip, MD MEDICATIONS: Heparin  3000 units, tPA 4 mg ANESTHESIA/SEDATION: General anesthesia.  FLUOROSCOPY: Radiation Exposure Index (as provided by the fluoroscopic device): 24 mGy Kerma CONTRAST:  72 mL Omnipaque  300 COMPLICATIONS: None immediate. PROCEDURE: The procedure was explained to the patient. The risks and benefits of the procedure were discussed and the patient's questions were addressed. Informed consent was obtained from the patient. The left upper extremity was prepped and draped in a sterile fashion. Maximal barrier sterile technique was utilized including caps, mask, sterile gowns, sterile gloves, sterile drape, hand hygiene and skin antiseptic. Ultrasound demonstrated a thrombosed left upper extremity AV graft. Ultrasound image was saved for documentation. Using ultrasound guidance, 21 gauge needle was directed in the graft towards the central veins. Micropuncture dilator set was placed. Six [place] vascular sheath was placed over a superstiff Amplatz wire. Kumpe catheter was advanced into the central veins using a Glidewire. Central venogram was performed. Pull-back venogram was performed to identify thrombus in the outflow stent and graft. 4 mg of tPA was infused within the thrombosed graft. During the thrombolytic dwell, a 21 gauge needle was directed into the graft towards the arterial anastomosis using ultrasound guidance. Micropuncture dilator set was placed. 6 French vascular sheath was placed over superstiff Amplatz wire. Kumpe catheter was advanced into the central veins and a Bentson wire was placed. The graft and stent were treated with Angiojet thrombectomy device.  Kumpe catheter was advanced towards the arterial anastomosis using a stiff Glidewire. Wire was advanced into the brachial artery. A Fogarty balloon was pulled across the arterial anastomosis at 2 times. There was flow in the graft at this point. Angiogram demonstrated an outflow occlusion. The graft and stent at the venous anastomosis were treated with the Angiojet device and balloon angioplastied with a 7 mm x 40 mm  balloon. Follow-up shuntogram images demonstrated excellent flow through the graft. Reflux images demonstrated residual clot in the graft closer to the arterial anastomosis. The distal aspect of the graft near the arterial anastomosis was angioplastied with a 6 mm x 40 mm balloon. Follow-up shuntogram images were obtained. Final angiogram images were obtained including a repeat central venogram. Vascular sheaths were removed using a pursestring suture and bumper. FINDINGS: Left upper extremity AV graft was completely occluded including the stent at the venous anastomosis. Central veins were patent. Following the declot procedure, the graft was widely patent including the stent at the venous anastomosis. IMPRESSION: 1. Successful declot of the left upper extremity AV graft. Electronically Signed   By: Juliene Balder M.D.   On: 05/30/2024 14:38   IR THROMBECTOMY AV FISTULA W/THROMBOLYSIS/PTA INC/SHUNT/IMG LEFT Result Date: 05/30/2024 INDICATION: End-stage renal disease with a thrombosed left upper extremity AV graft. Graft was recently declotted on 05/23/2024. EXAM: 1. Declot of left upper extremity AV graft with thrombolytics, mechanical thrombectomy and balloon angioplasty. 2. Ultrasound guidance for vascular access Physician: Juliene SAUNDERS. Henn, MD MEDICATIONS: Heparin  3000 units, tPA 4 mg ANESTHESIA/SEDATION: General anesthesia. FLUOROSCOPY: Radiation Exposure Index (as provided by the fluoroscopic device): 24 mGy Kerma CONTRAST:  72 mL Omnipaque  300 COMPLICATIONS: None immediate. PROCEDURE: The procedure was explained to the patient. The risks and benefits of the procedure were discussed and the patient's questions were addressed. Informed consent was obtained from the patient. The left upper extremity was prepped and draped in a sterile fashion. Maximal barrier sterile technique was utilized including caps, mask, sterile gowns, sterile gloves, sterile drape, hand hygiene and skin antiseptic. Ultrasound demonstrated a  thrombosed left upper extremity AV graft. Ultrasound image was saved for documentation. Using ultrasound guidance, 21 gauge needle was directed in the graft towards the central veins. Micropuncture dilator set was placed. Six [place] vascular sheath was placed over a superstiff Amplatz wire. Kumpe catheter was advanced into the central veins using a Glidewire. Central venogram was performed. Pull-back venogram was performed to identify thrombus in the outflow stent and graft. 4 mg of tPA was infused within the thrombosed graft. During the thrombolytic dwell, a 21 gauge needle was directed into the graft towards the arterial anastomosis using ultrasound guidance. Micropuncture dilator set was placed. 6 French vascular sheath was placed over superstiff Amplatz wire. Kumpe catheter was advanced into the central veins and a Bentson wire was placed. The graft and stent were treated with Angiojet thrombectomy device. Kumpe catheter was advanced towards the arterial anastomosis using a stiff Glidewire. Wire was advanced into the brachial artery. A Fogarty balloon was pulled across the arterial anastomosis at 2 times. There was flow in the graft at this point. Angiogram demonstrated an outflow occlusion. The graft and stent at the venous anastomosis were treated with the Angiojet device and balloon angioplastied with a 7 mm x 40 mm balloon. Follow-up shuntogram images demonstrated excellent flow through the graft. Reflux images demonstrated residual clot in the graft closer to the arterial anastomosis. The distal aspect of the graft near the arterial anastomosis was angioplastied with a  6 mm x 40 mm balloon. Follow-up shuntogram images were obtained. Final angiogram images were obtained including a repeat central venogram. Vascular sheaths were removed using a pursestring suture and bumper. FINDINGS: Left upper extremity AV graft was completely occluded including the stent at the venous anastomosis. Central veins were  patent. Following the declot procedure, the graft was widely patent including the stent at the venous anastomosis. IMPRESSION: 1. Successful declot of the left upper extremity AV graft. Electronically Signed   By: Juliene Balder M.D.   On: 05/30/2024 14:38   VAS US  DUPLEX DIALYSIS ACCESS (AVF, AVG) Result Date: 05/29/2024 DIALYSIS ACCESS Patient Name:  Linda [name]  Date of Exam:   05/29/2024 Medical Rec #: [id number]              Accession #:    [id number] Date of Birth: [birth year]              Patient Gender: F Patient Age:   67 years Exam Location:  [facility] Procedure:      VAS US  DUPLEX DIALYSIS ACCESS (AVF, AVG) Referring Phys: MELANIE BELFI --------------------------------------------------------------------------------  Reason for Exam: No palpable thrill for AVF/AVG. Access Site: Left Upper Extremity. Access Type: Brachial-cephalic with axillary stent. History: Patient had thrombectomy of left upper arm AV graft by interventional          radiology 05/23/24. Patient had successful dialysis treatment Saturday,          9/17, but could not complete dialysis today because fistula was not          working. Patient has significant pain in left upper extremity. Limitations: Pain, tissue properties Comparison Study: Prior study indicating occluded axillary stent done 05/19/2024 Performing Technologist: Alberta Lis RVS  Examination Guidelines: A complete evaluation includes B-mode imaging, spectral Doppler, color Doppler, and power Doppler as needed of all accessible portions of each vessel. Unilateral testing is considered an integral part of a complete examination. Limited examinations for reoccurring indications may be performed as noted.    Summary: Arteriovenous fistula-Thrombus noted.  Occluded left axillary stent. Abnormal left radial and ulnar arterial flow.  Diagnosing physician: Debby Robertson Electronically signed by Debby Robertson on 05/29/2024 at 5:10:49 PM.     --------------------------------------------------------------------------------   Final    DG Chest 2 View Result Date: 05/29/2024 CLINICAL DATA:  SOB EXAM: CHEST - 2 VIEW COMPARISON:  05/19/2024 FINDINGS: Mild central pulmonary vascular congestion. No focal airspace consolidation, pleural effusion, or pneumothorax. Moderate cardiomegaly.No acute fracture or destructive lesion. Partially visualized vascular stent in the left axilla. IMPRESSION: Moderate cardiomegaly with central pulmonary vascular congestion. Electronically Signed   By: Rogelia Myers M.D.   On: 05/29/2024 09:34   Medications:  anticoagulant sodium citrate       amLODipine   10 mg Oral QHS   carvedilol   25 mg Oral BID WC   Chlorhexidine  Gluconate Cloth  6 each Topical Q0600   cloNIDine   0.1 mg Oral Daily   doxazosin   2 mg Oral Daily   furosemide   80 mg Oral Daily   [START ON 05/31/2024] heparin   2,500 Units Dialysis Once in dialysis   heparin   5,000 Units Subcutaneous Q8H   irbesartan   300 mg Oral Daily   lidocaine -EPINEPHrine   20 mL Intradermal Once   oxyCODONE   2.5 mg Oral Once   sodium chloride  flush  3 mL Intravenous Q12H    Dialysis Orders MWF - AF 3.5hr, BFR 400, EDW 68.4kg, 2K bath, LUE AVG, heparin  3000  Assessment/Plan: Clotted LUE AVG:  S/p recent declot, just opened 9/23. IR consulted, s/p another declot this AM. Pre-emptive TDC placement discussed btu she refused. If clots again, will need [facility] and referral to [facility] for revision. Advised not to sleep on L side, no LUE compression, etc ESRD: Usual MWF schedule - chronically overloaded, tried to dialyze today, but she refused. HTN/volume: BP high with excess volume, fluids disc. Continue home meds. Anemia of ESRD: Hgb 9.3 - resume ESA as outpatient. Secondary HPTH: Ca ok, Phos not done. Continue home meds.  Dispo: Reluctantly ok from discharge today with plan to go to usual outpatient HD in AM. Counseled on low K diet and low fluids.  Addendum: Pt now agreeable  to remain inpatient. Unfortunately, the HD unit has already moved on to other patients. May be able to do her overnight, but more likely will be 1st thing tomorrow morning. RN and hospitalist aware.  Izetta Boehringer, PA-C 05/30/2024, 3:19 PM  [employer]

## 2024-05-30 NOTE — Procedures (Signed)
 Interventional Radiology Procedure:   Indications: Thrombosed left arm AV graft  Procedure: Left arm AV graft declot  Findings: Left upper arm graft was occluded, including the stent.  Successful declot with TPA, AngioJet and balloon angioplasty.  Graft is widely patent at end of procedure.   Complications: None     EBL: Minimal  Plan: Left arm graft is ready to use.  Sutures can be removed tomorrow.   Damarie Schoolfield R. Philip, MD  Pager: [phone]

## 2024-05-30 NOTE — Anesthesia Procedure Notes (Signed)
 Procedure Name: Intubation Date/Time: 05/30/2024 10:02 AM  Performed by: Wynonia Alfonzo LABOR, CRNAPre-anesthesia Checklist: Patient identified, Emergency Drugs available, Suction available and Patient being monitored Patient Re-evaluated:Patient Re-evaluated prior to induction Oxygen Delivery Method: Circle System Utilized Preoxygenation: Pre-oxygenation with 100% oxygen Induction Type: IV induction Ventilation: Mask ventilation without difficulty Laryngoscope Size: Glidescope and 3 Grade View: Grade I Tube type: Oral Tube size: 7.5 mm Number of attempts: 1 Airway Equipment and Method: Stylet and Oral airway Placement Confirmation: ETT inserted through vocal cords under direct vision, positive ETCO2 and breath sounds checked- equal and bilateral Secured at: 22 cm Tube secured with: Tape Dental Injury: Teeth and Oropharynx as per pre-operative assessment

## 2024-05-30 NOTE — Anesthesia Postprocedure Evaluation (Signed)
 Anesthesia Post Note  Patient: Linda [name]  Procedure(s) Performed: RADIOLOGY WITH ANESTHESIA     Patient location during evaluation: PACU Anesthesia Type: General Level of consciousness: awake and alert Pain management: pain level controlled Vital Signs Assessment: post-procedure vital signs reviewed and stable Respiratory status: spontaneous breathing, nonlabored ventilation and respiratory function stable Cardiovascular status: blood pressure returned to baseline and stable Postop Assessment: no apparent nausea or vomiting Anesthetic complications: no   No notable events documented.  Last Vitals:  Vitals:   05/30/24 1208 05/30/24 1225  BP: (!) 152/115 (!) 147/106  Pulse: 61 63  Resp: 19 20  Temp: 36.5 C   SpO2: 94% 100%    Last Pain:  Vitals:   05/30/24 1208  TempSrc:   PainSc: Asleep                 Butler Levander Pinal

## 2024-05-30 NOTE — Plan of Care (Signed)
   Problem: Education: Goal: Knowledge of General Education information will improve Description Including pain rating scale, medication(s)/side effects and non-pharmacologic comfort measures Outcome: Progressing

## 2024-05-30 NOTE — Progress Notes (Signed)
 Patient off floor to IR at this time.

## 2024-05-30 NOTE — Sedation Documentation (Signed)
 Patient moved to table by staff , secured, hooked to monitors, and now under the care of anesthesia. Please see charting in vitals per CRNA.

## 2024-05-30 NOTE — Transfer of Care (Signed)
 Immediate Anesthesia Transfer of Care Note  Patient: Linda [name]  Procedure(s) Performed: RADIOLOGY WITH ANESTHESIA  Patient Location: PACU  Anesthesia Type:General  Level of Consciousness: awake, alert , and oriented  Airway & Oxygen Therapy: Patient Spontanous Breathing and Patient connected to face mask oxygen  Post-op Assessment: Report given to RN and Post -op Vital signs reviewed and stable  Post vital signs: Reviewed and stable  Last Vitals:  Vitals Value Taken Time  BP 145/114 05/30/24 11:38  Temp 36.5 C 05/30/24 11:38  Pulse 65 05/30/24 11:38  Resp 20 05/30/24 11:44  SpO2 98 % 05/30/24 11:38  Vitals shown include unfiled device data.  Last Pain:  Vitals:   05/30/24 0831  TempSrc:   PainSc: 8          Complications: No notable events documented.

## 2024-05-30 NOTE — Plan of Care (Signed)
  Problem: Education: Goal: Knowledge of General Education information will improve Description: Including pain rating scale, medication(s)/side effects and non-pharmacologic comfort measures 05/30/2024 0033 by Okey Caron SAUNDERS, RN Outcome: Progressing 05/30/2024 0032 by Okey Caron SAUNDERS, RN Outcome: Progressing 05/30/2024 0032 by Okey Caron SAUNDERS, RN Outcome: Progressing   Problem: Health Behavior/Discharge Planning: Goal: Ability to manage health-related needs will improve 05/30/2024 0033 by Okey Caron SAUNDERS, RN Outcome: Progressing 05/30/2024 0032 by Okey Caron SAUNDERS, RN Outcome: Progressing 05/30/2024 0032 by Okey Caron SAUNDERS, RN Outcome: Progressing   Problem: Clinical Measurements: Goal: Ability to maintain clinical measurements within normal limits will improve 05/30/2024 0033 by Okey Caron SAUNDERS, RN Outcome: Progressing 05/30/2024 0032 by Okey Caron SAUNDERS, RN Outcome: Progressing 05/30/2024 0032 by Okey Caron SAUNDERS, RN Outcome: Progressing Goal: Will remain free from infection 05/30/2024 0033 by Okey Caron SAUNDERS, RN Outcome: Progressing 05/30/2024 0032 by Okey Caron SAUNDERS, RN Outcome: Progressing 05/30/2024 0032 by Okey Caron SAUNDERS, RN Outcome: Progressing Goal: Diagnostic test results will improve 05/30/2024 0033 by Okey Caron SAUNDERS, RN Outcome: Progressing 05/30/2024 0032 by Okey Caron SAUNDERS, RN Outcome: Progressing 05/30/2024 0032 by Okey Caron SAUNDERS, RN Outcome: Progressing Goal: Respiratory complications will improve 05/30/2024 0033 by Okey Caron SAUNDERS, RN Outcome: Progressing 05/30/2024 0032 by Okey Caron SAUNDERS, RN Outcome: Progressing 05/30/2024 0032 by Okey Caron SAUNDERS, RN Outcome: Progressing Goal: Cardiovascular complication will be avoided 05/30/2024 0033 by Okey Caron SAUNDERS, RN Outcome: Progressing 05/30/2024 0032 by Okey Caron SAUNDERS, RN Outcome: Progressing 05/30/2024 0032 by Okey Caron SAUNDERS, RN Outcome: Progressing

## 2024-05-30 NOTE — Progress Notes (Signed)
 Pt receives out-pt HD at [facility] [place] on MWF 7:10 am chair time. Will assist as needed.   Randine Mungo Dialysis Navigator [phone]

## 2024-05-30 NOTE — Progress Notes (Signed)
 PROGRESS NOTE    Linda [name]  FMW:[id number] DOB: Jan 15, 1996 DOA: 05/29/2024 PCP: [name], NP    Brief Narrative:  This 28 yrs old female with PMH significant for hypertension, GERD, QT prolongation, pericardial effusion, G6PD deficiency, CHF, lupus, ESRD on HD, anxiety, anemia presenting with AV graft malfunction.  She was recently admitted 9/19-9/25 with AV graft occlusion. Received temporary HD catheter and underwent HD while IR addressed AV graft occlusion with thrombectomy. Temporary catheter was removed and patient was discharged.  Patient went for dialysis on Monday and staff was unable to access her graft.  She was sent in the ED for further evaluation.  Nephrology was consulted and will dialysis when able, no indication for urgent dialysis.  IR consulted to have temporary catheter placement and to address AV graft occlusion.  Assessment & Plan:   Principal Problem:   AV graft malfunction, initial encounter Active Problems:   Vascular graft occlusion   Essential hypertension   SLE (systemic lupus erythematosus related syndrome) (HCC)   GERD without esophagitis   ESRD on dialysis (HCC)   Prolonged QT interval   Anxiety   Anemia of chronic disease   HFrEF (heart failure with reduced ejection fraction) (HCC)   G6P deficiency (glucose-6-phosphatase deficiency) (HCC)   ESRD on HD: AV graft malfunction: She was recently admitted 9/19-9/25. Thrombectomy was performed, temporary catheter was placed for hemodialysis. Catheter removed and patient discharged home.   She was dialyzing well until Saturday and then HD staff unable to access graft again. ED consulted IR to address access and nephrology for dialysis when able. Patient is scheduled to have declotting of the AV graft occlusion and placement of temporary dialysis catheter today. Trend renal function and electrolytes. Patient would likely need dialysis once [facility] is inserted. Follow up post operative  orders.   Hypertension: Continue home amlodipine , carvedilol , clonidine , Lasix , irbesartan .   Chronic systolic CHF: Last echo in July showed EF 30-35%, indeterminate diastolic function, normal RV function. Continue Lasix , Coreg , irbesartan  as above.   Lupus: Follows with [facility] Not currently on medication   Anemia of chronic disease: Hemoglobin stable at 9.2 Trend CBC   G6PD deficiency: Noted.   DVT prophylaxis: Heparin   Code Status:Full code Family Communication: No family at bed side Disposition Plan:   Status is: Observation The patient remains OBS appropriate and will d/c before 2 midnights.   Patient admitted for occlusion of the AV graft, IR consulted for temporary hemodialysis catheter placed with 200 AV graft occlusion.  Consultants:  IR Nephrology  Procedures:   Antimicrobials:  Anti-infectives (From admission, onward)    Start     Dose/Rate Route Frequency Ordered Stop   05/30/24 0830  ceFAZolin  (ANCEF ) IVPB 2g/100 mL premix        2 g 200 mL/hr over 30 Minutes Intravenous To Radiology 05/30/24 9178 05/30/24 1012      Subjective: Patient was seen and examined at bedside.  Overnight events noted. Patient is scheduled to have declotting of the AV graft occlusion and placement of temporary hemodialysis catheter today.  Objective: Vitals:   05/29/24 1854 05/29/24 2051 05/30/24 0514 05/30/24 0815  BP:  (!) 134/101 (!) 147/109 (!) 146/121  Pulse: 74 78 71 72  Resp: 19 20 18 18   Temp:  97.6 F (36.4 C) 97.9 F (36.6 C) (!) 97.4 F (36.3 C)  TempSrc:  Oral  Oral  SpO2: 99%  100% 98%  Weight:    74.8 kg  Height:    5'  6 (1.676 m)   No intake or output data in the 24 hours ending 05/30/24 1110 Filed Weights   05/29/24 0824 05/30/24 0815  Weight: 74.8 kg 74.8 kg    Examination:  General exam: Appears calm and comfortable, not in any acute distress. Respiratory system: Clear to auscultation. Respiratory effort normal.   RR15 Cardiovascular system: S1 & S2 heard, RRR. No JVD, murmurs, rubs, gallops or clicks.  Gastrointestinal system: Abdomen is non distended, soft and non tender. Normal bowel sounds heard. Central nervous system: Alert and oriented X 3. No focal neurological deficits. Extremities: No edema, no cyanosis, no clubbing. Skin: No rashes, lesions or ulcers Psychiatry: Judgement and insight appear normal. Mood & affect appropriate.     Data Reviewed: I have personally reviewed following labs and imaging studies  CBC: Recent Labs  Lab 05/23/24 1425 05/23/24 1541 05/23/24 1704 05/29/24 0848 05/30/24 0453  WBC  --   --   --  7.1 8.0  HGB 10.9* 10.5* 10.9* 9.2* 9.3*  HCT 32.0* 31.0* 32.0* 29.1* 28.7*  MCV  --   --   --  96.7 94.7  PLT  --   --   --  235 260   Basic Metabolic Panel: Recent Labs  Lab 05/23/24 1425 05/23/24 1541 05/23/24 1704 05/29/24 0848 05/30/24 0453  NA 125* 126* 125* 129* 125*  K 5.7* 5.8* 6.3* 4.8 5.4*  CL 96* 99 97* 91* 90*  CO2  --   --   --  22 21*  GLUCOSE 188* 58* 82 81 159*  BUN 90* 87* 91* 51* 63*  CREATININE 13.80* 11.90* 13.50* 8.64* 9.93*  CALCIUM   --   --   --  8.9 9.2   GFR: Estimated Creatinine Clearance: 8.7 mL/min (A) (by C-G formula based on SCr of 9.93 mg/dL (H)). Liver Function Tests: Recent Labs  Lab 05/30/24 0453  AST 24  ALT 13  ALKPHOS 117  BILITOT 1.1  PROT 7.0  ALBUMIN  2.9*   No results for input(s): LIPASE, AMYLASE in the last 168 hours. No results for input(s): AMMONIA in the last 168 hours. Coagulation Profile: No results for input(s): INR, PROTIME in the last 168 hours. Cardiac Enzymes: No results for input(s): CKTOTAL, CKMB, CKMBINDEX, TROPONINI in the last 168 hours. BNP (last 3 results) No results for input(s): PROBNP in the last 8760 hours. HbA1C: No results for input(s): HGBA1C in the last 72 hours. CBG: Recent Labs  Lab 05/23/24 1612 05/23/24 1652  GLUCAP 41* 86   Lipid  Profile: No results for input(s): CHOL, HDL, LDLCALC, TRIG, CHOLHDL, LDLDIRECT in the last 72 hours. Thyroid Function Tests: No results for input(s): TSH, T4TOTAL, FREET4, T3FREE, THYROIDAB in the last 72 hours. Anemia Panel: No results for input(s): VITAMINB12, FOLATE, FERRITIN, TIBC, IRON , RETICCTPCT in the last 72 hours. Sepsis Labs: No results for input(s): PROCALCITON, LATICACIDVEN in the last 168 hours.  Recent Results (from the past 240 hours)  MRSA Next Gen by PCR, Nasal     Status: None   Collection Time: 05/30/24  3:57 AM   Specimen: Nasal Mucosa; Nasal Swab  Result Value Ref Range Status   MRSA by PCR Next Gen NOT DETECTED NOT DETECTED Final    Comment: (NOTE) The GeneXpert MRSA Assay (FDA approved for NASAL specimens only), is one component of a comprehensive MRSA colonization surveillance program. It is not intended to diagnose MRSA infection nor to guide or monitor treatment for MRSA infections. Test performance is not FDA approved in patients less  than 30 years old. Performed at [facility], [address], [place], [place] [address]     Radiology Studies: VAS US  DUPLEX DIALYSIS ACCESS (AVF, AVG) Result Date: 05/29/2024 DIALYSIS ACCESS Patient Name:  LEXYS MILLINER Fellman  Date of Exam:   05/29/2024 Medical Rec #: [id number]              Accession #:    [id number] Date of Birth: [birth year]              Patient Gender: F Patient Age:   31 years Exam Location:  [facility] Procedure:      VAS US  DUPLEX DIALYSIS ACCESS (AVF, AVG) Referring Phys: MELANIE BELFI --------------------------------------------------------------------------------  Reason for Exam: No palpable thrill for AVF/AVG. Access Site: Left Upper Extremity. Access Type: Brachial-cephalic with axillary stent. History: Patient had thrombectomy of left upper arm AV graft by interventional          radiology 05/23/24. Patient had successful dialysis treatment Saturday,           9/17, but could not complete dialysis today because fistula was not          working. Patient has significant pain in left upper extremity. Limitations: Pain, tissue properties Comparison Study: Prior study indicating occluded axillary stent done 05/19/2024 Performing Technologist: Alberta Lis RVS  Examination Guidelines: A complete evaluation includes B-mode imaging, spectral Doppler, color Doppler, and power Doppler as needed of all accessible portions of each vessel. Unilateral testing is considered an integral part of a complete examination. Limited examinations for reoccurring indications may be performed as noted.    Summary: Arteriovenous fistula-Thrombus noted.  Occluded left axillary stent. Abnormal left radial and ulnar arterial flow.  Diagnosing physician: Debby Robertson Electronically signed by Debby Robertson on 05/29/2024 at 5:10:49 PM.    --------------------------------------------------------------------------------   Final    DG Chest 2 View Result Date: 05/29/2024 CLINICAL DATA:  SOB EXAM: CHEST - 2 VIEW COMPARISON:  05/19/2024 FINDINGS: Mild central pulmonary vascular congestion. No focal airspace consolidation, pleural effusion, or pneumothorax. Moderate cardiomegaly.No acute fracture or destructive lesion. Partially visualized vascular stent in the left axilla. IMPRESSION: Moderate cardiomegaly with central pulmonary vascular congestion. Electronically Signed   By: Rogelia Myers M.D.   On: 05/29/2024 09:34   Scheduled Meds:  [MAR Hold] amLODipine   10 mg Oral QHS   [MAR Hold] carvedilol   25 mg Oral BID WC   [MAR Hold] Chlorhexidine  Gluconate Cloth  6 each Topical Q0600   [MAR Hold] cloNIDine   0.1 mg Oral Daily   [MAR Hold] doxazosin   2 mg Oral Daily   fentaNYL        [MAR Hold] furosemide   80 mg Oral Daily   [MAR Hold] heparin   5,000 Units Subcutaneous Q8H   [MAR Hold] irbesartan   300 mg Oral Daily   lidocaine -EPINEPHrine   20 mL Intradermal Once   [MAR Hold] oxyCODONE    2.5 mg Oral Once   [MAR Hold] sodium chloride  flush  3 mL Intravenous Q12H   Continuous Infusions:  sodium chloride      alteplase        LOS: 0 days    Time spent: 50 mins    Darcel Dawley, MD [facility]   If 7PM-7AM, please contact night-coverage

## 2024-05-31 ENCOUNTER — Encounter (HOSPITAL_COMMUNITY): Payer: Self-pay | Admitting: Diagnostic Radiology

## 2024-05-31 ENCOUNTER — Encounter (HOSPITAL_BASED_OUTPATIENT_CLINIC_OR_DEPARTMENT_OTHER): Payer: Self-pay

## 2024-05-31 DIAGNOSIS — T82868A Thrombosis of vascular prosthetic devices, implants and grafts, initial encounter: Secondary | ICD-10-CM | POA: Diagnosis not present

## 2024-05-31 DIAGNOSIS — T82590A Other mechanical complication of surgically created arteriovenous fistula, initial encounter: Secondary | ICD-10-CM | POA: Diagnosis not present

## 2024-05-31 LAB — BASIC METABOLIC PANEL WITH GFR
Anion gap: 17 — ABNORMAL HIGH (ref 5–15)
BUN: 82 mg/dL — ABNORMAL HIGH (ref 6–20)
CO2: 23 mmol/L (ref 22–32)
Calcium: 9.2 mg/dL (ref 8.9–10.3)
Chloride: 88 mmol/L — ABNORMAL LOW (ref 98–111)
Creatinine, Ser: 11.48 mg/dL — ABNORMAL HIGH (ref 0.44–1.00)
GFR, Estimated: 4 mL/min — ABNORMAL LOW (ref >60)
Glucose, Bld: 104 mg/dL — ABNORMAL HIGH (ref 70–99)
Potassium: 5.5 mmol/L — ABNORMAL HIGH (ref 3.5–5.1)
Sodium: 128 mmol/L — ABNORMAL LOW (ref 135–145)

## 2024-05-31 LAB — CBC
HCT: 27.1 % — ABNORMAL LOW (ref 36.0–46.0)
Hemoglobin: 8.7 g/dL — ABNORMAL LOW (ref 12.0–15.0)
MCH: 30.6 pg (ref 26.0–34.0)
MCHC: 32.1 g/dL (ref 30.0–36.0)
MCV: 95.4 fL (ref 80.0–100.0)
Platelets: 249 K/uL (ref 150–400)
RBC: 2.84 MIL/uL — ABNORMAL LOW (ref 3.87–5.11)
RDW: 17.8 % — ABNORMAL HIGH (ref 11.5–15.5)
WBC: 14.8 K/uL — ABNORMAL HIGH (ref 4.0–10.5)
nRBC: 0 % (ref 0.0–0.2)

## 2024-05-31 LAB — MAGNESIUM: Magnesium: 2.6 mg/dL — ABNORMAL HIGH (ref 1.7–2.4)

## 2024-05-31 LAB — PHOSPHORUS: Phosphorus: 9.1 mg/dL — ABNORMAL HIGH (ref 2.5–4.6)

## 2024-05-31 MED ORDER — HYDROMORPHONE HCL 1 MG/ML IJ SOLN
INTRAMUSCULAR | Status: AC
  Filled 2024-05-31: qty 0.5

## 2024-05-31 MED ORDER — HEPARIN SODIUM (PORCINE) 1000 UNIT/ML IJ SOLN
INTRAMUSCULAR | Status: AC
  Filled 2024-05-31: qty 4

## 2024-05-31 NOTE — Plan of Care (Signed)
   Problem: Activity: Goal: Risk for activity intolerance will decrease Outcome: Progressing

## 2024-05-31 NOTE — Progress Notes (Signed)
 0900: Patient to HD  1225: Report for [facility], Runtime: 3.5hr, 5L off, Vitals stable, on RA 100% 1256: Patient returns back to room, given food, upset about d/c requesting to speak with provider 1300: central telemetry called, is aware that patient is d/c 1350: IV removed for pt and d/c instructions completed, patient states she does not want to take any due medications at this time 1356: AVS printed and volunteer services contacted, patient states she will drive herself home 8592: patient out of room

## 2024-05-31 NOTE — Hospital Course (Addendum)
 Linda [name] is a 28 y.o. female with PMH of  hypertension, GERD, QT prolongation, pericardial effusion, G6PD deficiency, CHF, lupus, ESRD on HD, anxiety, anemia presenting with AV graft malfunction.  She was recently admitted 9/19-9/25 with AV graft occlusion. Received temporary HD catheter and underwent HD while IR addressed AV graft occlusion with thrombectomy. Temporary catheter was removed and patient was discharged.  Patient went for dialysis on Monday and staff was unable to access her graft.  She was sent in the ED for further evaluation.  Nephrology was consulted and will dialysis when able, no indication for urgent dialysis.  IR consulted to have temporary catheter placement and to address AV graft occlusion IR was consulted other declotting done 9/30> underwent dialysis on 10/1 and if she does okay she will be discharged home after dialysis  Subjective: Seen and examined today She is in dialysis she has no complaint Overnight afebrile BP stable on 2 L nasal cannula Labs reviewed potassium 5.5 sodium 128 BUN 82 creatinine 1.4 mild leukocytosis patient getting dialysis this morning  Discharge diagnosis:  Clotted LUE AVG ESRD on HD MWF Secondary HPTH: Recent declotting and chest open 9/23, IR was consulted other declotting done 9/30, patient refused TDC placement.  If clots again will need [facility] and referral to [facility] for revision, nephrology managing, continue HD 10/1.  Monitor calcium  phos and volume status Plan for dialysis today and home . I discussed with nephro no plan for further HD here as her vollume is stable  Hyperkalemia Hyponatremia: Adjusted electrolytes dialysis.  Anemia of ESRD: Hemoglobin stable, monitor, continue ESA as outpatient  Hypertension: BP stable, continue amlodipine  Coreg  clonidine  and doxazosin  furosemide  ARB   Chronic systolic CHF: Volume status stable being optimized with dialysis.  Last echo in July showed EF 30-35%, indeterminate diastolic  function, normal RV function. Continue Lasix , Coreg , irbesartan  as above.   Lupus: Follows with [facility]. Not currently on medication   G6PD deficiency: Noted.  DVT prophylaxis: heparin  injection 5,000 Units Start: 05/29/24 1600 Code Status:   Code Status: Full Code Family Communication: plan of care discussed with patient at bedside. Patient status is: Remains hospitalized because of severity of illness Level of care: Telemetry Medical   Dispo: The patient is from: HOME            Anticipated disposition: Home after dialysis objective: Vitals last 24 hrs: Vitals:   05/31/24 0809 05/31/24 0821 05/31/24 0830 05/31/24 0900  BP: (!) 141/107 (!) 127/102 (!) 133/102   Pulse: 72 69 67   Resp: 20 18 17 16   Temp: 97.6 F (36.4 C)     TempSrc: Oral     SpO2: 99% 97% 98%   Weight: 81.9 kg     Height:        Physical Examination: General exam: alert awake, oriented, older than stated age HEENT:Oral mucosa moist, Ear/Nose WNL grossly Respiratory system: Bilaterally clear BS,no use of accessory muscle Cardiovascular system: S1 & S2 +, No JVD. Gastrointestinal system: Abdomen soft,NT,ND, BS+ Nervous System: Alert, awake, moving all extremities,and following commands. Extremities: extremities warm, leg edema neg Skin: No rashes,no icterus. MSK: Normal muscle bulk,tone, power

## 2024-05-31 NOTE — Discharge Summary (Signed)
 Physician Discharge Summary  [name] FMW:[id number] DOB: [birth year] DOA: 05/29/2024  PCP: [name], NP  Admit date: 05/29/2024 Discharge date: 05/31/2024 Recommendations for Outpatient Follow-up:  Follow up with PCP in 1 weeks-call for appointment Please obtain BMP/CBC in one week  Discharge Dispo: Home Discharge Condition: Stable Code Status:   Code Status: Full Code Diet recommendation:  Diet Order             Diet renal with fluid restriction           Diet renal with fluid restriction Fluid restriction: 1200 mL Fluid; Room service appropriate? Yes; Fluid consistency: Thin  Diet effective now                    Brief/Interim Summary: Linda [name] is a 28 y.o. female with PMH of  hypertension, GERD, QT prolongation, pericardial effusion, G6PD deficiency, CHF, lupus, ESRD on HD, anxiety, anemia presenting with AV graft malfunction.  She was recently admitted 9/19-9/25 with AV graft occlusion. Received temporary HD catheter and underwent HD while IR addressed AV graft occlusion with thrombectomy. Temporary catheter was removed and patient was discharged.  Patient went for dialysis on Monday and staff was unable to access her graft.  She was sent in the ED for further evaluation.  Nephrology was consulted and will dialysis when able, no indication for urgent dialysis.  IR consulted to have temporary catheter placement and to address AV graft occlusion IR was consulted other declotting done 9/30> underwent dialysis on 10/1 and if she does okay she will be discharged home after dialysis  Subjective: Seen and examined today She is in dialysis she has no complaint Overnight afebrile BP stable on 2 L nasal cannula Labs reviewed potassium 5.5 sodium 128 BUN 82 creatinine 1.4 mild leukocytosis patient getting dialysis this morning  Discharge diagnosis:  Clotted LUE AVG ESRD on HD MWF Secondary HPTH: Recent declotting and chest open 9/23, IR was  consulted other declotting done 9/30, patient refused TDC placement.  If clots again will need [facility] and referral to [facility] for revision, nephrology managing, continue HD 10/1.  Monitor calcium  phos and volume status Plan for dialysis today and home . I discussed with nephro no plan for further HD here as her vollume is stable  Hyperkalemia Hyponatremia: Adjusted electrolytes dialysis.  Anemia of ESRD: Hemoglobin stable, monitor, continue ESA as outpatient  Hypertension: BP stable, continue amlodipine  Coreg  clonidine  and doxazosin  furosemide  ARB   Chronic systolic CHF: Volume status stable being optimized with dialysis.  Last echo in July showed EF 30-35%, indeterminate diastolic function, normal RV function. Continue Lasix , Coreg , irbesartan  as above.   Lupus: Follows with [facility]. Not currently on medication   G6PD deficiency: Noted.  DVT prophylaxis: heparin  injection 5,000 Units Start: 05/29/24 1600 Code Status:   Code Status: Full Code Family Communication: plan of care discussed with patient at bedside. Patient status is: Remains hospitalized because of severity of illness Level of care: Telemetry Medical   Dispo: The patient is from: HOME            Anticipated disposition: Home after dialysis objective: Vitals last 24 hrs: Vitals:   05/31/24 0809 05/31/24 0821 05/31/24 0830 05/31/24 0900  BP: (!) 141/107 (!) 127/102 (!) 133/102   Pulse: 72 69 67   Resp: 20 18 17 16   Temp: 97.6 F (36.4 C)     TempSrc: Oral     SpO2: 99% 97% 98%   Weight: 81.9 kg  Height:        Physical Examination: General exam: alert awake, oriented, older than stated age HEENT:Oral mucosa moist, Ear/Nose WNL grossly Respiratory system: Bilaterally clear BS,no use of accessory muscle Cardiovascular system: S1 & S2 +, No JVD. Gastrointestinal system: Abdomen soft,NT,ND, BS+ Nervous System: Alert, awake, moving all extremities,and following commands. Extremities: extremities warm,  leg edema neg Skin: No rashes,no icterus. MSK: Normal muscle bulk,tone, power     Procedure(s) (LRB): RADIOLOGY WITH ANESTHESIA (N/A)  Consultation: See note.  Discharge Instructions  Discharge Instructions     Diet renal with fluid restriction   Complete by: As directed    Discharge instructions   Complete by: As directed    Please call call MD or return to ER for similar or worsening recurring problem that brought you to hospital or if any fever,nausea/vomiting,abdominal pain, uncontrolled pain, chest pain,  shortness of breath or any other alarming symptoms.  Please follow-up your doctor as instructed in a week time and call the office for appointment.  Please avoid alcohol, smoking, or any other illicit substance and maintain healthy habits including taking your regular medications as prescribed.  You were cared for by a hospitalist during your hospital stay. If you have any questions about your discharge medications or the care you received while you were in the hospital after you are discharged, you can call the unit and ask to speak with the hospitalist on call if the hospitalist that took care of you is not available.  Once you are discharged, your primary care physician will handle any further medical issues. Please note that NO REFILLS for any discharge medications will be authorized once you are discharged, as it is imperative that you return to your primary care physician (or establish a relationship with a primary care physician if you do not have one) for your aftercare needs so that they can reassess your need for medications and monitor your lab values   Increase activity slowly   Complete by: As directed    No wound care   Complete by: As directed       Allergies as of 05/31/2024       Reactions   Iodine  Hives, Shortness Of Breath   Lisinopril  Anaphylaxis   Angioedema   Gabapentin Other (See Comments)   Reaction: Tremor (intolerance); tremor   Ativan   [lorazepam ] Other (See Comments)   Somnolent with 1mg  ativan  at [facility] Nov 2019. Patient stated it made her feel like she shut down and was mute and non-responding.   Hydroxychloroquine  Other (See Comments)   Pt reports making her skin peel really bad   Vicodin [hydrocodone -acetaminophen ] Itching, Rash        Medication List     TAKE these medications    acetaminophen  325 MG tablet Commonly known as: TYLENOL  Take 2 tablets (650 mg total) by mouth every 6 (six) hours.   allopurinol  100 MG tablet Commonly known as: ZYLOPRIM  Take 1 tablet (100 mg total) by mouth daily.   amLODipine  10 MG tablet Commonly known as: NORVASC  Take 1 tablet (10 mg total) by mouth at bedtime.   Blood Pressure Kit Uncontrolled blood pressure   Omron 3 Series BP Monitor Devi Use to check blood pressure   carvedilol  25 MG tablet Commonly known as: COREG  Take 1 tablet (25 mg total) by mouth 2 (two) times daily with a meal.   cloNIDine  0.1 MG tablet Commonly known as: CATAPRES  Take 1 tablet (0.1 mg total) by mouth daily.  doxazosin  2 MG tablet Commonly known as: CARDURA  Take 1 tablet (2 mg total) by mouth daily.   furosemide  80 MG tablet Commonly known as: LASIX  Take 1 tablet (80 mg total) by mouth daily.   irbesartan  300 MG tablet Commonly known as: AVAPRO  Take 1 tablet (300 mg total) by mouth daily.   lidocaine -prilocaine  cream Commonly known as: EMLA  Apply 1 Application topically daily as needed (for dialysis).   magic mouthwash (lidocaine , diphenhydrAMINE , alum & mag hydroxide) suspension Swish and spit 5 mLs 4 (four) times daily as needed for mouth pain.   nitroGLYCERIN  0.4 MG SL tablet Commonly known as: NITROSTAT  Place 1 tablet (0.4 mg total) under the tongue every 5 (five) minutes as needed for chest pain.   ondansetron  4 MG tablet Commonly known as: ZOFRAN  Take 1 tablet (4 mg total) by mouth every 6 (six) hours.   sevelamer  carbonate 2.4 g Pack Commonly known as:  RENVELA  Take 2.4 g by mouth daily.   tiZANidine  4 MG tablet Commonly known as: ZANAFLEX  Take 1 tablet (4 mg total) by mouth daily as needed for muscle spasms.        Follow-up Information     [name], NP Follow up in 1 week(s).   Specialty: Family Medicine Contact information: [address] [place] [address] [phone]                Allergies  Allergen Reactions   Iodine  Hives and Shortness Of Breath   Lisinopril  Anaphylaxis    Angioedema   Gabapentin Other (See Comments)    Reaction: Tremor (intolerance); tremor   Ativan  [Lorazepam ] Other (See Comments)    Somnolent with 1mg  ativan  at [facility] Nov 2019. Patient stated it made her feel like she shut down and was mute and non-responding.   Hydroxychloroquine  Other (See Comments)    Pt reports making her skin peel really bad   Vicodin [Hydrocodone -Acetaminophen ] Itching and Rash    The results of significant diagnostics from this hospitalization (including imaging, microbiology, ancillary and laboratory) are listed below for reference.    Microbiology: Recent Results (from the past 240 hours)  MRSA Next Gen by PCR, Nasal     Status: None   Collection Time: 05/30/24  3:57 AM   Specimen: Nasal Mucosa; Nasal Swab  Result Value Ref Range Status   MRSA by PCR Next Gen NOT DETECTED NOT DETECTED Final    Comment: (NOTE) The GeneXpert MRSA Assay (FDA approved for NASAL specimens only), is one component of a comprehensive MRSA colonization surveillance program. It is not intended to diagnose MRSA infection nor to guide or monitor treatment for MRSA infections. Test performance is not FDA approved in patients less than 52 years old. Performed at [facility], [address] [place], [place] [address]     Procedures/Studies: IR US  Guide Vasc Access Left Result Date: 05/30/2024 RESULT     INDICATION: End-stage renal disease with a thrombosed left upper extremity AV graft. Graft  was recently declotted on 05/23/2024. EXAM: 1. Declot of left upper extremity AV graft with thrombolytics, mechanical thrombectomy and balloon angioplasty. 2. Ultrasound guidance for vascular access Physician: Juliene SAUNDERS. Henn, MD MEDICATIONS: Heparin  3000 units, tPA 4 mg ANESTHESIA/SEDATION: General anesthesia. FLUOROSCOPY: Radiation Exposure Index (as provided by the fluoroscopic device): 24 mGy Kerma CONTRAST:  72 mL Omnipaque  300 COMPLICATIONS: None immediate. PROCEDURE: The procedure was explained to the patient. The risks and benefits of the procedure were discussed and the patient's questions were addressed.  Informed consent was obtained from the patient. The left upper extremity was prepped and draped in a sterile fashion. Maximal barrier sterile technique was utilized including caps, mask, sterile gowns, sterile gloves, sterile drape, hand hygiene and skin antiseptic. Ultrasound demonstrated a thrombosed left upper extremity AV graft. Ultrasound image was saved for documentation. Using ultrasound guidance, 21 gauge needle was directed in the graft towards the central veins. Micropuncture dilator set was placed. Six [place] vascular sheath was placed over a superstiff Amplatz wire. Kumpe catheter was advanced into the central veins using a Glidewire. Central venogram was performed. Pull-back venogram was performed to identify thrombus in the outflow stent and graft. 4 mg of tPA was infused within the thrombosed graft. During the thrombolytic dwell, a 21 gauge needle was directed into the graft towards the arterial anastomosis using ultrasound guidance. Micropuncture dilator set was placed. 6 French vascular sheath was placed over superstiff Amplatz wire. Kumpe catheter was advanced into the central veins and a Bentson wire was placed. The graft and stent were treated with Angiojet thrombectomy device. Kumpe catheter was advanced towards the arterial anastomosis using a stiff Glidewire. Wire was advanced into the  brachial artery. A Fogarty balloon was pulled across the arterial anastomosis at 2 times. There was flow in the graft at this point. Angiogram demonstrated an outflow occlusion. The graft and stent at the venous anastomosis were treated with the Angiojet device and balloon angioplastied with a 7 mm x 40 mm balloon. Follow-up shuntogram images demonstrated excellent flow through the graft. Reflux images demonstrated residual clot in the graft closer to the arterial anastomosis. The distal aspect of the graft near the arterial anastomosis was angioplastied with a 6 mm x 40 mm balloon. Follow-up shuntogram images were obtained. Final angiogram images were obtained including a repeat central venogram. Vascular sheaths were removed using a pursestring suture and bumper. FINDINGS: Left upper extremity AV graft was completely occluded including the stent at the venous anastomosis. Central veins were patent. Following the declot procedure, the graft was widely patent including the stent at the venous anastomosis. IMPRESSION: 1. Successful declot of the left upper extremity AV graft. Electronically Signed   By: Juliene Balder M.D.   On: 05/30/2024 14:38   IR THROMBECTOMY AV FISTULA W/THROMBOLYSIS/PTA INC/SHUNT/IMG LEFT Result Date: 05/30/2024 INDICATION: End-stage renal disease with a thrombosed left upper extremity AV graft. Graft was recently declotted on 05/23/2024. EXAM: 1. Declot of left upper extremity AV graft with thrombolytics, mechanical thrombectomy and balloon angioplasty. 2. Ultrasound guidance for vascular access Physician: Juliene SAUNDERS. Balder, MD MEDICATIONS: Heparin  3000 units, tPA 4 mg ANESTHESIA/SEDATION: General anesthesia. FLUOROSCOPY: Radiation Exposure Index (as provided by the fluoroscopic device): 24 mGy Kerma CONTRAST:  72 mL Omnipaque  300 COMPLICATIONS: None immediate. PROCEDURE: The procedure was explained to the patient. The risks and benefits of the procedure were discussed and the patient's questions  were addressed. Informed consent was obtained from the patient. The left upper extremity was prepped and draped in a sterile fashion. Maximal barrier sterile technique was utilized including caps, mask, sterile gowns, sterile gloves, sterile drape, hand hygiene and skin antiseptic. Ultrasound demonstrated a thrombosed left upper extremity AV graft. Ultrasound image was saved for documentation. Using ultrasound guidance, 21 gauge needle was directed in the graft towards the central veins. Micropuncture dilator set was placed. Six [place] vascular sheath was placed over a superstiff Amplatz wire. Kumpe catheter was advanced into the central veins using a Glidewire. Central venogram was performed. Pull-back venogram was performed to identify  thrombus in the outflow stent and graft. 4 mg of tPA was infused within the thrombosed graft. During the thrombolytic dwell, a 21 gauge needle was directed into the graft towards the arterial anastomosis using ultrasound guidance. Micropuncture dilator set was placed. 6 French vascular sheath was placed over superstiff Amplatz wire. Kumpe catheter was advanced into the central veins and a Bentson wire was placed. The graft and stent were treated with Angiojet thrombectomy device. Kumpe catheter was advanced towards the arterial anastomosis using a stiff Glidewire. Wire was advanced into the brachial artery. A Fogarty balloon was pulled across the arterial anastomosis at 2 times. There was flow in the graft at this point. Angiogram demonstrated an outflow occlusion. The graft and stent at the venous anastomosis were treated with the Angiojet device and balloon angioplastied with a 7 mm x 40 mm balloon. Follow-up shuntogram images demonstrated excellent flow through the graft. Reflux images demonstrated residual clot in the graft closer to the arterial anastomosis. The distal aspect of the graft near the arterial anastomosis was angioplastied with a 6 mm x 40 mm balloon. Follow-up  shuntogram images were obtained. Final angiogram images were obtained including a repeat central venogram. Vascular sheaths were removed using a pursestring suture and bumper. FINDINGS: Left upper extremity AV graft was completely occluded including the stent at the venous anastomosis. Central veins were patent. Following the declot procedure, the graft was widely patent including the stent at the venous anastomosis. IMPRESSION: 1. Successful declot of the left upper extremity AV graft. Electronically Signed   By: Juliene Balder M.D.   On: 05/30/2024 14:38   VAS US  DUPLEX DIALYSIS ACCESS (AVF, AVG) Result Date: 05/29/2024 DIALYSIS ACCESS Patient Name:  RIKO LUMSDEN Gittins  Date of Exam:   05/29/2024 Medical Rec #: [id number]              Accession #:    [id number] Date of Birth: 1995/11/15              Patient Gender: F Patient Age:   28 years Exam Location:  [facility] ([facility]) Procedure:      VAS US  DUPLEX DIALYSIS ACCESS (AVF, AVG) Referring Phys: MELANIE BELFI --------------------------------------------------------------------------------  Reason for Exam: No palpable thrill for AVF/AVG. Access Site: Left Upper Extremity. Access Type: Brachial-cephalic with axillary stent. History: Patient had thrombectomy of left upper arm AV graft by interventional          radiology 05/23/24. Patient had successful dialysis treatment Saturday,          9/17, but could not complete dialysis today because fistula was not          working. Patient has significant pain in left upper extremity. Limitations: Pain, tissue properties Comparison Study: Prior study indicating occluded axillary stent done 05/19/2024 Performing Technologist: Alberta Lis RVS  Examination Guidelines: A complete evaluation includes B-mode imaging, spectral Doppler, color Doppler, and power Doppler as needed of all accessible portions of each vessel. Unilateral testing is considered an integral part of a complete examination. Limited examinations for  reoccurring indications may be performed as noted.    Summary: Arteriovenous fistula-Thrombus noted.  Occluded left axillary stent. Abnormal left radial and ulnar arterial flow.  Diagnosing physician: Debby Robertson Electronically signed by Debby Robertson on 05/29/2024 at 5:10:49 PM.    --------------------------------------------------------------------------------   Final    DG Chest 2 View Result Date: 05/29/2024 CLINICAL DATA:  SOB EXAM: CHEST - 2 VIEW COMPARISON:  05/19/2024 FINDINGS: Mild central pulmonary vascular congestion. No focal airspace  consolidation, pleural effusion, or pneumothorax. Moderate cardiomegaly.No acute fracture or destructive lesion. Partially visualized vascular stent in the left axilla. IMPRESSION: Moderate cardiomegaly with central pulmonary vascular congestion. Electronically Signed   By: Rogelia Myers M.D.   On: 05/29/2024 09:34   IR US  Guide Vasc Access Left Result Date: 05/23/2024 INDICATION: 28 year old female with end-stage renal disease requiring hemodialysis with malfunctioning and occluded left upper extremity arteriovenous graft. EXAM: IR ULTRASOUND GUIDANCE VASC ACCESS LEFT; CV CATH DECLOT W/THROMBOLYTIC DWELL COMPARISON:  None Available. MEDICATIONS: None. CONTRAST:  50 mL Omnipaque  300, intravenous ANESTHESIA/SEDATION: The procedure was performed under general anesthesia. FLUOROSCOPY TIME:  19.9 mGy reference air kerma COMPLICATIONS: None immediate. PROCEDURE: Informed written consent was obtained from the patient after a discussion of the risk, benefits and alternatives to treatment. Questions regarding the procedure were encouraged and answered. A timeout was performed prior to the initiation of the procedure. The left arm dialysis graft was prepped with Chlorhexidine  in a sterile fashion, and a sterile drape was applied covering the operative field. Preprocedure ultrasound evaluation demonstrated patency of the brachial artery with occlusion from the arterial  anastomosis through the puncture zone in venous outflow of the visualized upper extremity graft. The procedure was planned. Subdermal Local anesthesia was administered at the planned needle entry sites for antegrade and retrograde access with 1% lidocaine . Under direct ultrasound visualization, a 21 gauge micropuncture needle was inserted into the peripheral aspect of the graft near the arterial anastomosis. A permanent ultrasound image was captured and stored in the record. A micropuncture wire was inserted over which the wire was exchanged for micropuncture sheath. A Wholey wire was then directed to the central veins in the micropuncture sheath was exchanged for a 6 French short vascular sheath. Over the wire, a Kumpe the catheter was directed to the level of the left innominate vein. The wire was removed and contrast was injected while retracting the catheter which demonstrated patency of the left innominate, left subclavian, and left axillary veins. There was thrombus present in the indwelling central stent within the venous outflow of the graft near the axillary anastomosis. The catheter was readvanced and a total of 8 mg tPA was administered while retracting the catheter through the thrombus to the level of the puncture zone. During the tPA dwell time, ultrasound-guided retrograde access was then obtained with a 21 gauge micropuncture needle. A permanent ultrasound image was captured and stored in the record. A micropuncture wire was then placed over which a micropuncture sheath was inserted. A Wholey wire was then directed into the brachial artery in retrograde fashion over which a Kumpe the catheter was placed. Contrast injection demonstrated patency of the brachial artery with persistent occlusion of the arterial anastomosis. From this location, a total of 3 Fogarty balloon catheter passes were made to free the arterial anastomotic plug. The wire was removed. From the antegrade access, a 6 French cleaner  device was advanced to the indwelling axillary anastomotic stent. The device was used into separate retraction pulls to the indwelling antegrade sheath. This was followed by balloon angioplasty with a 7 mm x 40 mm [employer] balloon in multiple stations extending from the indwelling axial anastomosis stent to the puncture zone. Balloon was removed. Through the antegrade sheath, completion fistulagram was performed which demonstrated brisk antegrade flow without significant filling defect through the puncture zone and venous outflow. The indwelling stent appeared widely patent. The central veins were widely patent. The sheath was removed and hemostasis obtained with application of a 0 Prolene  pursestring suture which will be removed at the patient's next dialysis session. The indwelling left tunneled hemodialysis catheter was then prepped and draped in standard fashion. The retention sutures were removed. The catheter was then removed intact without necessity for blunt or sharp dissection. Sterile dressings were placed. The patient tolerated the procedure well without immediate postprocedural complication. IMPRESSION: 1. Technically successful pharmacomechanical declot of left upper extremity brachial basilic arteriovenous graft. 2. Technically successful removal of left IJ tunneled hemodialysis catheter. ACCESS: This access remains amenable to future percutaneous interventions as clinically indicated. Ester Sides, MD [facility] Electronically Signed   By: Ester Sides M.D.   On: 05/23/2024 22:30   IR AV DIALY SHUNT INTRO NEEDLE/INTRACATH INITIAL W/PTA/IMG LEFT Result Date: 05/23/2024 INDICATION: 28 year old female with end-stage renal disease requiring hemodialysis with malfunctioning and occluded left upper extremity arteriovenous graft. EXAM: IR ULTRASOUND GUIDANCE VASC ACCESS LEFT; CV CATH DECLOT W/THROMBOLYTIC DWELL COMPARISON:  None Available.  MEDICATIONS: None. CONTRAST:  50 mL Omnipaque  300, intravenous ANESTHESIA/SEDATION: The procedure was performed under general anesthesia. FLUOROSCOPY TIME:  19.9 mGy reference air kerma COMPLICATIONS: None immediate. PROCEDURE: Informed written consent was obtained from the patient after a discussion of the risk, benefits and alternatives to treatment. Questions regarding the procedure were encouraged and answered. A timeout was performed prior to the initiation of the procedure. The left arm dialysis graft was prepped with Chlorhexidine  in a sterile fashion, and a sterile drape was applied covering the operative field. Preprocedure ultrasound evaluation demonstrated patency of the brachial artery with occlusion from the arterial anastomosis through the puncture zone in venous outflow of the visualized upper extremity graft. The procedure was planned. Subdermal Local anesthesia was administered at the planned needle entry sites for antegrade and retrograde access with 1% lidocaine . Under direct ultrasound visualization, a 21 gauge micropuncture needle was inserted into the peripheral aspect of the graft near the arterial anastomosis. A permanent ultrasound image was captured and stored in the record. A micropuncture wire was inserted over which the wire was exchanged for micropuncture sheath. A Wholey wire was then directed to the central veins in the micropuncture sheath was exchanged for a 6 French short vascular sheath. Over the wire, a Kumpe the catheter was directed to the level of the left innominate vein. The wire was removed and contrast was injected while retracting the catheter which demonstrated patency of the left innominate, left subclavian, and left axillary veins. There was thrombus present in the indwelling central stent within the venous outflow of the graft near the axillary anastomosis. The catheter was readvanced and a total of 8 mg tPA was administered while retracting the catheter through the  thrombus to the level of the puncture zone. During the tPA dwell time, ultrasound-guided retrograde access was then obtained with a 21 gauge micropuncture needle. A permanent ultrasound image was captured and stored in the record. A micropuncture wire was then placed over which a micropuncture sheath was inserted. A Wholey wire was then directed into the brachial artery in retrograde fashion over which a Kumpe the catheter was placed. Contrast injection demonstrated patency of the brachial artery with persistent occlusion of the arterial anastomosis. From this location, a total of 3 Fogarty balloon catheter passes were made to free the arterial anastomotic plug. The wire was removed. From the antegrade access, a 6 French cleaner device was advanced to the indwelling axillary anastomotic stent. The device was used into separate retraction pulls to the indwelling antegrade sheath. This was  followed by balloon angioplasty with a 7 mm x 40 mm Athletis balloon in multiple stations extending from the indwelling axial anastomosis stent to the puncture zone. Balloon was removed. Through the antegrade sheath, completion fistulagram was performed which demonstrated brisk antegrade flow without significant filling defect through the puncture zone and venous outflow. The indwelling stent appeared widely patent. The central veins were widely patent. The sheath was removed and hemostasis obtained with application of a 0 Prolene pursestring suture which will be removed at the patient's next dialysis session. The indwelling left tunneled hemodialysis catheter was then prepped and draped in standard fashion. The retention sutures were removed. The catheter was then removed intact without necessity for blunt or sharp dissection. Sterile dressings were placed. The patient tolerated the procedure well without immediate postprocedural complication. IMPRESSION: 1. Technically successful pharmacomechanical declot of left upper extremity  brachial basilic arteriovenous graft. 2. Technically successful removal of left IJ tunneled hemodialysis catheter. ACCESS: This access remains amenable to future percutaneous interventions as clinically indicated. Ester Sides, MD [facility] Radiology Electronically Signed   By: Ester Sides M.D.   On: 05/23/2024 22:30   IR TUNNELED CENTRAL VENOUS CATH [facility] W IMG Addendum Date: 05/19/2024 ADDENDUM REPORT: 05/19/2024 17:26 ADDENDUM: Under exam the exam is a placement of a tunneled hemodialysis catheter with ultrasound and fluoroscopic guidance Electronically Signed   By: Cordella Banner   On: 05/19/2024 17:26   Result Date: 05/19/2024 INDICATION: Infiltrated left arm AV fistula causing exquisite pain in his now unusable for hemodialysis. Planned placement of a left IJ hemodialysis catheter as the right internal jugular vein is occluded. EXAM: TUNNELED PICC LINE WITH ULTRASOUND AND FLUOROSCOPIC GUIDANCE MEDICATIONS: 2 g Ancef . The antibiotic was given in an appropriate time interval prior to skin puncture. ANESTHESIA/SEDATION: Moderate (conscious) sedation was employed during this procedure. A total of Versed  4 mg and Fentanyl  200 mcg was administered intravenously by the radiology nurse. Total intra-service moderate Sedation Time: 23 minutes. The patient's level of consciousness and vital signs were monitored continuously by radiology nursing throughout the procedure under my direct supervision. FLUOROSCOPY: Radiation Exposure Index (as provided by the fluoroscopic device): 49 mGy Kerma COMPLICATIONS: None immediate. PROCEDURE: Informed written consent was obtained from the patient after a discussion of the risks, benefits, and alternatives to treatment. Questions regarding the procedure were encouraged and answered. The right neck and chest were prepped with chlorhexidine  in a sterile fashion, and a sterile drape was applied covering the operative field.  Maximum barrier sterile technique with sterile gowns and gloves were used for the procedure. A timeout was performed prior to the initiation of the procedure. After creating a small venotomy incision, a micropuncture kit was utilized to access the left internal jugular vein under direct, real-time ultrasound guidance after the overlying soft tissues were anesthetized with 1% lidocaine  with epinephrine . Ultrasound image documentation was performed. The microwire was kinked to measure appropriate catheter length. The micropuncture sheath was exchanged for a peel-away sheath over a guidewire. A 15 French tunneled hemodialysis catheter measuring 28 cm was prepped at the table. The catheter was then placed through the peel-away sheath with tip ultimately positioned at the superior caval-atrial junction. Final catheter positioning was confirmed and documented with a spot radiographic image. The catheter aspirates and flushes normally. The catheter was flushed with appropriate volume heparin  dwells. The catheter exit site was secured with a 0-Prolene retention suture. The venotomy incision was closed with an interrupted 4-0 Vicryl, Dermabond and Steri-strips. Dressings  were applied. The patient tolerated the procedure well without immediate post procedural complication. FINDINGS: After catheter placement, the tip lies within the superior cavoatrial junction. The catheter aspirates and flushes normally and is ready for immediate use. IMPRESSION: Successful placement 28 cm left-sided hemodialysis tunneled catheter. Electronically Signed: By: Cordella Banner On: 05/19/2024 17:21   VAS US  DUPLEX DIALYSIS ACCESS (AVF, AVG) Result Date: 05/19/2024 DIALYSIS ACCESS Patient Name:  SHANNEN FLANSBURG Parisien  Date of Exam:   05/19/2024 Medical Rec #: [id number]              Accession #:    [id number] Date of Birth: 1996-03-12              Patient Gender: F Patient Age:   58 years Exam Location:  [facility] Procedure:      VAS  US  DUPLEX DIALYSIS ACCESS (AVF, AVG) Referring Phys: MICHAEL PENNA --------------------------------------------------------------------------------  Reason for Exam: No palpable thrill for AVF/AVG. Access Site: Left Upper Extremity. Access Type: Brachial-Axillary. History: Stent in axilla. Pain during dialysis session on Wednesday after nurse          stuck fistula lower than normal. Patient halted session. Presents with          increased pain and swelling of left upper extremity and diminished          thrill. Limitations: Pain with touch and swelling Comparison Study: No prior study Performing Technologist: Alberta Lis RVS  Examination Guidelines: A complete evaluation includes B-mode imaging, spectral Doppler, color Doppler, and power Doppler as needed of all accessible portions of each vessel. Unilateral testing is considered an integral part of a complete examination. Limited examinations for reoccurring indications may be performed as noted.     Summary: Stent within upper arm appears occluded. Minimal flow noted throughout left upper extremity fistula.   Diagnosing physician: Debby Robertson Electronically signed by Debby Robertson on 05/19/2024 at 3:40:32 PM.    --------------------------------------------------------------------------------   Final    DG Chest Portable 1 View Result Date: 05/19/2024 CLINICAL DATA:  Left upper extremity pain and edema. History of dialysis. EXAM: PORTABLE CHEST 1 VIEW COMPARISON:  May 14, 2024. FINDINGS: Stable cardiomegaly. Both lungs are clear. The visualized skeletal structures are unremarkable. IMPRESSION: No active disease. Electronically Signed   By: Lynwood Landy Raddle M.D.   On: 05/19/2024 08:06   DG Chest 2 View Result Date: 05/14/2024 EXAM: 2 VIEW(S) XRAY OF THE CHEST 05/14/2024 05:25:32 AM COMPARISON: 04/07/2024 CLINICAL HISTORY: Pt in with reported sob and cough that began last night. Dialysis pt, last session Friday. Also reporting some L back pain.  FINDINGS: LUNGS AND PLEURA: Pulmonary edema, mild. No focal pulmonary opacity. No pleural effusion. No pneumothorax. HEART AND MEDIASTINUM: Cardiomegaly. No acute abnormality of the cardiac and mediastinal silhouettes. BONES AND SOFT TISSUES: No acute osseous abnormality. Vascular stent in left upper arm. IMPRESSION: 1. Cardiomegaly. 2. Mild pulmonary edema. Electronically signed by: Waddell Calk MD 05/14/2024 05:59 AM EDT RP Workstation: [id number]    Labs: BNP (last 3 results) Recent Labs    01/11/24 0348 02/08/24 1451 05/14/24 0504  BNP >4,500.0* >4,500.0* >4,500.0*   Basic Metabolic Panel: Recent Labs  Lab 05/29/24 0848 05/30/24 0453 05/31/24 0413  NA 129* 125* 128*  K 4.8 5.4* 5.5*  CL 91* 90* 88*  CO2 22 21* 23  GLUCOSE 81 159* 104*  BUN 51* 63* 82*  CREATININE 8.64* 9.93* 11.48*  CALCIUM  8.9 9.2 9.2  MG  --   --  2.6*  PHOS  --   --  9.1*   Liver Function Tests: Recent Labs  Lab 05/30/24 0453  AST 24  ALT 13  ALKPHOS 117  BILITOT 1.1  PROT 7.0  ALBUMIN  2.9*   No results for input(s): LIPASE, AMYLASE in the last 168 hours. No results for input(s): AMMONIA in the last 168 hours. CBC: Recent Labs  Lab 05/29/24 0848 05/30/24 0453 05/31/24 0413  WBC 7.1 8.0 14.8*  HGB 9.2* 9.3* 8.7*  HCT 29.1* 28.7* 27.1*  MCV 96.7 94.7 95.4  PLT 235 260 249   CBG: No results for input(s): GLUCAP in the last 168 hours. Hgb A1c No results for input(s): HGBA1C in the last 72 hours. Anemia work up No results for input(s): VITAMINB12, FOLATE, FERRITIN, TIBC, IRON , RETICCTPCT in the last 72 hours. Cardiac Enzymes: No results for input(s): CKTOTAL, CKMB, CKMBINDEX, TROPONINI in the last 168 hours. BNP: Invalid input(s): POCBNP D-Dimer No results for input(s): DDIMER in the last 72 hours. Lipid Profile No results for input(s): CHOL, HDL, LDLCALC, TRIG, CHOLHDL, LDLDIRECT in the last 72 hours. Thyroid function studies No  results for input(s): TSH, T4TOTAL, T3FREE, THYROIDAB in the last 72 hours.  Invalid input(s): FREET3 Urinalysis    Component Value Date/Time   COLORURINE YELLOW 04/06/2024 0529   APPEARANCEUR HAZY (A) 04/06/2024 0529   LABSPEC 1.004 (L) 04/06/2024 0529   PHURINE 9.0 (H) 04/06/2024 0529   GLUCOSEU NEGATIVE 04/06/2024 0529   HGBUR NEGATIVE 04/06/2024 0529   BILIRUBINUR NEGATIVE 04/06/2024 0529   BILIRUBINUR Negative 02/10/2018 1602   KETONESUR NEGATIVE 04/06/2024 0529   PROTEINUR 100 (A) 04/06/2024 0529   UROBILINOGEN 0.2 02/10/2018 1602   UROBILINOGEN 1.0 03/30/2015 2328   NITRITE NEGATIVE 04/06/2024 0529   LEUKOCYTESUR NEGATIVE 04/06/2024 0529   Sepsis Labs Recent Labs  Lab 05/29/24 0848 05/30/24 0453 05/31/24 0413  WBC 7.1 8.0 14.8*   Microbiology Recent Results (from the past 240 hours)  MRSA Next Gen by PCR, Nasal     Status: None   Collection Time: 05/30/24  3:57 AM   Specimen: Nasal Mucosa; Nasal Swab  Result Value Ref Range Status   MRSA by PCR Next Gen NOT DETECTED NOT DETECTED Final    Comment: (NOTE) The GeneXpert MRSA Assay (FDA approved for NASAL specimens only), is one component of a comprehensive MRSA colonization surveillance program. It is not intended to diagnose MRSA infection nor to guide or monitor treatment for MRSA infections. Test performance is not FDA approved in patients less than 59 years old. Performed at [facility], [address], [place], [place] [address]    Time coordinating discharge: 25  minutes.  SIGNED: Mennie LAMY, MD  [facility] 05/31/2024, 1:36 PM  If 7PM-7AM, please contact night-coverage [email or web address]

## 2024-05-31 NOTE — Progress Notes (Signed)
 [facility] Progress Note   Subjective:   Seen in [facility]. Using AVG on dialysis. Per team, working well this am.  She has no complaints, sleepy. Denies cp, sob  Objective Vitals:   05/31/24 0500 05/31/24 0809 05/31/24 0821 05/31/24 0830  BP: (!) 138/106 (!) 141/107 (!) 127/102 (!) 133/102  Pulse: 69 72 69 67  Resp: 18 20 18 17   Temp: 97.7 F (36.5 C) 97.6 F (36.4 C)    TempSrc: Oral Oral    SpO2: 95% 99% 97% 98%  Weight:  81.9 kg    Height:       Physical Exam General: Lying in bed on nasal oxygen  Heart: RRR Lungs: Scattered rhonchi, no rales Abdomen: soft Extremities: 2+ BLE edema Dialysis Access: LUE AVG + bruit  Additional Objective Labs: Basic Metabolic Panel: Recent Labs  Lab 05/29/24 0848 05/30/24 0453 05/31/24 0413  NA 129* 125* 128*  K 4.8 5.4* 5.5*  CL 91* 90* 88*  CO2 22 21* 23  GLUCOSE 81 159* 104*  BUN 51* 63* 82*  CREATININE 8.64* 9.93* 11.48*  CALCIUM  8.9 9.2 9.2  PHOS  --   --  9.1*   Liver Function Tests: Recent Labs  Lab 05/30/24 0453  AST 24  ALT 13  ALKPHOS 117  BILITOT 1.1  PROT 7.0  ALBUMIN  2.9*   CBC: Recent Labs  Lab 05/29/24 0848 05/30/24 0453 05/31/24 0413  WBC 7.1 8.0 14.8*  HGB 9.2* 9.3* 8.7*  HCT 29.1* 28.7* 27.1*  MCV 96.7 94.7 95.4  PLT 235 260 249   Studies/Results: IR US  Guide Vasc Access Left Result Date: 05/30/2024 RESULT     INDICATION: End-stage renal disease with a thrombosed left upper extremity AV graft. Graft was recently declotted on 05/23/2024. EXAM: 1. Declot of left upper extremity AV graft with thrombolytics, mechanical thrombectomy and balloon angioplasty. 2. Ultrasound guidance for vascular access Physician: Juliene SAUNDERS. Philip, MD MEDICATIONS: Heparin  3000 units, tPA 4 mg ANESTHESIA/SEDATION: General anesthesia. FLUOROSCOPY: Radiation Exposure Index (as provided by the fluoroscopic device): 24 mGy Kerma CONTRAST:  72 mL Omnipaque  300 COMPLICATIONS: None immediate. PROCEDURE: The procedure  was explained to the patient. The risks and benefits of the procedure were discussed and the patient's questions were addressed. Informed consent was obtained from the patient. The left upper extremity was prepped and draped in a sterile fashion. Maximal barrier sterile technique was utilized including caps, mask, sterile gowns, sterile gloves, sterile drape, hand hygiene and skin antiseptic. Ultrasound demonstrated a thrombosed left upper extremity AV graft. Ultrasound image was saved for documentation. Using ultrasound guidance, 21 gauge needle was directed in the graft towards the central veins. Micropuncture dilator set was placed. Six [place] vascular sheath was placed over a superstiff Amplatz wire. Kumpe catheter was advanced into the central veins using a Glidewire. Central venogram was performed. Pull-back venogram was performed to identify thrombus in the outflow stent and graft. 4 mg of tPA was infused within the thrombosed graft. During the thrombolytic dwell, a 21 gauge needle was directed into the graft towards the arterial anastomosis using ultrasound guidance. Micropuncture dilator set was placed. 6 French vascular sheath was placed over superstiff Amplatz wire. Kumpe catheter was advanced into the central veins and a Bentson wire was placed. The graft and stent were treated with Angiojet thrombectomy device. Kumpe catheter was advanced towards the arterial anastomosis using a stiff Glidewire. Wire was advanced into the brachial artery. A Fogarty balloon was pulled across the arterial anastomosis at 2 times. There  was flow in the graft at this point. Angiogram demonstrated an outflow occlusion. The graft and stent at the venous anastomosis were treated with the Angiojet device and balloon angioplastied with a 7 mm x 40 mm balloon. Follow-up shuntogram images demonstrated excellent flow through the graft. Reflux images demonstrated residual clot in the graft closer to the arterial anastomosis. The  distal aspect of the graft near the arterial anastomosis was angioplastied with a 6 mm x 40 mm balloon. Follow-up shuntogram images were obtained. Final angiogram images were obtained including a repeat central venogram. Vascular sheaths were removed using a pursestring suture and bumper. FINDINGS: Left upper extremity AV graft was completely occluded including the stent at the venous anastomosis. Central veins were patent. Following the declot procedure, the graft was widely patent including the stent at the venous anastomosis. IMPRESSION: 1. Successful declot of the left upper extremity AV graft. Electronically Signed   By: Juliene Balder M.D.   On: 05/30/2024 14:38   IR THROMBECTOMY AV FISTULA W/THROMBOLYSIS/PTA INC/SHUNT/IMG LEFT Result Date: 05/30/2024 INDICATION: End-stage renal disease with a thrombosed left upper extremity AV graft. Graft was recently declotted on 05/23/2024. EXAM: 1. Declot of left upper extremity AV graft with thrombolytics, mechanical thrombectomy and balloon angioplasty. 2. Ultrasound guidance for vascular access Physician: Juliene SAUNDERS. Balder, MD MEDICATIONS: Heparin  3000 units, tPA 4 mg ANESTHESIA/SEDATION: General anesthesia. FLUOROSCOPY: Radiation Exposure Index (as provided by the fluoroscopic device): 24 mGy Kerma CONTRAST:  72 mL Omnipaque  300 COMPLICATIONS: None immediate. PROCEDURE: The procedure was explained to the patient. The risks and benefits of the procedure were discussed and the patient's questions were addressed. Informed consent was obtained from the patient. The left upper extremity was prepped and draped in a sterile fashion. Maximal barrier sterile technique was utilized including caps, mask, sterile gowns, sterile gloves, sterile drape, hand hygiene and skin antiseptic. Ultrasound demonstrated a thrombosed left upper extremity AV graft. Ultrasound image was saved for documentation. Using ultrasound guidance, 21 gauge needle was directed in the graft towards the central  veins. Micropuncture dilator set was placed. Six [place] vascular sheath was placed over a superstiff Amplatz wire. Kumpe catheter was advanced into the central veins using a Glidewire. Central venogram was performed. Pull-back venogram was performed to identify thrombus in the outflow stent and graft. 4 mg of tPA was infused within the thrombosed graft. During the thrombolytic dwell, a 21 gauge needle was directed into the graft towards the arterial anastomosis using ultrasound guidance. Micropuncture dilator set was placed. 6 French vascular sheath was placed over superstiff Amplatz wire. Kumpe catheter was advanced into the central veins and a Bentson wire was placed. The graft and stent were treated with Angiojet thrombectomy device. Kumpe catheter was advanced towards the arterial anastomosis using a stiff Glidewire. Wire was advanced into the brachial artery. A Fogarty balloon was pulled across the arterial anastomosis at 2 times. There was flow in the graft at this point. Angiogram demonstrated an outflow occlusion. The graft and stent at the venous anastomosis were treated with the Angiojet device and balloon angioplastied with a 7 mm x 40 mm balloon. Follow-up shuntogram images demonstrated excellent flow through the graft. Reflux images demonstrated residual clot in the graft closer to the arterial anastomosis. The distal aspect of the graft near the arterial anastomosis was angioplastied with a 6 mm x 40 mm balloon. Follow-up shuntogram images were obtained. Final angiogram images were obtained including a repeat central venogram. Vascular sheaths were removed using a pursestring suture and bumper. FINDINGS:  Left upper extremity AV graft was completely occluded including the stent at the venous anastomosis. Central veins were patent. Following the declot procedure, the graft was widely patent including the stent at the venous anastomosis. IMPRESSION: 1. Successful declot of the left upper extremity AV  graft. Electronically Signed   By: Juliene Balder M.D.   On: 05/30/2024 14:38   VAS US  DUPLEX DIALYSIS ACCESS (AVF, AVG) Result Date: 05/29/2024 DIALYSIS ACCESS Patient Name:  Linda [name]  Date of Exam:   05/29/2024 Medical Rec #: [id number]              Accession #:    [id number] Date of Birth: August 13, 1996              Patient Gender: F Patient Age:   28 years Exam Location:  [facility] Procedure:      VAS US  DUPLEX DIALYSIS ACCESS (AVF, AVG) Referring Phys: MELANIE BELFI --------------------------------------------------------------------------------  Reason for Exam: No palpable thrill for AVF/AVG. Access Site: Left Upper Extremity. Access Type: Brachial-cephalic with axillary stent. History: Patient had thrombectomy of left upper arm AV graft by interventional          radiology 05/23/24. Patient had successful dialysis treatment Saturday,          9/17, but could not complete dialysis today because fistula was not          working. Patient has significant pain in left upper extremity. Limitations: Pain, tissue properties Comparison Study: Prior study indicating occluded axillary stent done 05/19/2024 Performing Technologist: Alberta Lis RVS  Examination Guidelines: A complete evaluation includes B-mode imaging, spectral Doppler, color Doppler, and power Doppler as needed of all accessible portions of each vessel. Unilateral testing is considered an integral part of a complete examination. Limited examinations for reoccurring indications may be performed as noted.    Summary: Arteriovenous fistula-Thrombus noted.  Occluded left axillary stent. Abnormal left radial and ulnar arterial flow.  Diagnosing physician: Debby Robertson Electronically signed by Debby Robertson on 05/29/2024 at 5:10:49 PM.    --------------------------------------------------------------------------------   Final    DG Chest 2 View Result Date: 05/29/2024 CLINICAL DATA:  SOB EXAM: CHEST - 2 VIEW COMPARISON:  05/19/2024  FINDINGS: Mild central pulmonary vascular congestion. No focal airspace consolidation, pleural effusion, or pneumothorax. Moderate cardiomegaly.No acute fracture or destructive lesion. Partially visualized vascular stent in the left axilla. IMPRESSION: Moderate cardiomegaly with central pulmonary vascular congestion. Electronically Signed   By: Rogelia Myers M.D.   On: 05/29/2024 09:34   Medications:  anticoagulant sodium citrate       amLODipine   10 mg Oral QHS   carvedilol   25 mg Oral BID WC   Chlorhexidine  Gluconate Cloth  6 each Topical Q0600   cloNIDine   0.1 mg Oral Daily   doxazosin   2 mg Oral Daily   furosemide   80 mg Oral Daily   heparin   5,000 Units Subcutaneous Q8H   hydrocortisone    Rectal QID   irbesartan   300 mg Oral Daily   lidocaine -EPINEPHrine   20 mL Intradermal Once   oxyCODONE   2.5 mg Oral Once   sodium chloride  flush  3 mL Intravenous Q12H    Dialysis Orders MWF - AF 3.5hr, BFR 400, EDW 68.4kg, 2K bath, LUE AVG, heparin  3000  Assessment/Plan: Clotted LUE AVG: S/p recent declot, just opened 9/23. IR consulted, s/p another declot 9/30. Pre-emptive TDC placement discussed btu she refused. If clots again, will need [facility] and referral to [facility] for revision. Advised not to sleep on L side,  no LUE compression, etc ESRD: Usual MWF schedule - chronically overloaded. HD today  HTN/volume: BP high with excess volume, fluids disc. Continue home meds. Anemia of ESRD: Hgb 9.3 - resume ESA as outpatient. Secondary HPTH: Ca ok, Phos not done. Continue home meds.  Dispo: Ok for discharge after HD   Maisie Ronnald Acosta PA-C [facility] 05/31/2024,8:57 AM

## 2024-05-31 NOTE — Progress Notes (Signed)
 D/C order noted. Contacted [facility] to be advised of pt's d/c today and that pt should resume care on Friday.   Randine Mungo Dialysis Navigator [phone]

## 2024-05-31 NOTE — Discharge Planning (Signed)
 [facility] Patient Discharge Orders- [facility] CLINIC: [facility]  Patient's name: Linda [name] Admit/DC Dates: 05/29/2024 - 05/31/2024  Discharge Diagnoses: Clotted AVG  s/p IR declot. Used successfully with HD 05/31/24   Outpatient Dialysis Orders:  -Heparin : No change  -EDW No change  -Bath: No change  Anemia Aranesp : Given: --   Date of last dose/amount: --   PRBC's Given: -- Date/# of units: -- ESA dose for discharge: Mircera 75mcg IV q 2wks   Recent Labs  Lab 05/30/24 0453 05/31/24 0413  HGB 9.3* 8.7*  K 5.4* 5.5*  CALCIUM  9.2 9.2  PHOS  --  9.1*  ALBUMIN  2.9*  --     Access intervention/Change: AVG declot/angioplasty on 05/30/24.  - If clots again, consult [facility] for [facility] + access revision   Medications: -IV Antibiotics:   OTHER/APPTS   Completed by: Maisie Ronnald Acosta PA-C   D/C Meds to be reconciled by nurse after every discharge.    Reviewed by: MD:______ RN_______

## 2024-05-31 NOTE — Telephone Encounter (Signed)
 Noted sent to referral coordinator and resent records for patient.

## 2024-05-31 NOTE — Progress Notes (Signed)
 Interventional Radiology Brief Note:  2 sutures left in place after declot procedure in IR yesterday.  APP to bedside.  Sutures removed without issue.  No oozing/bleeding noted.  Dressing replaced.   Koren Plyler, MS RD PA-C

## 2024-05-31 NOTE — Discharge Instructions (Addendum)
 No new medications prescribed. No medications given today/this morning 10/1. You are okay to resume taking your medications today 10/1, per your medication reconciliation (completed with you and your provider. Krisalyn Yankowski, RN

## 2024-06-01 ENCOUNTER — Encounter (HOSPITAL_BASED_OUTPATIENT_CLINIC_OR_DEPARTMENT_OTHER): Admitting: Family

## 2024-06-01 ENCOUNTER — Telehealth: Payer: Self-pay | Admitting: Nephrology

## 2024-06-01 NOTE — Telephone Encounter (Signed)
 Transition of Care - Initial Contact after Hospitalization  Date of discharge:  05/31/24 Date of contact: 06/01/24  Method: Phone Spoke to: Patient  Patient contacted to discuss transition of care from recent inpatient hospitalization. Patient was admitted to [facility] from 9/29 -05/31/24 with discharge diagnosis of clotted AV graft   The discharge medication list was reviewed.   Patient will return to his/her outpatient HD unit on: Friday 10/3   No other concerns at this time.

## 2024-06-02 DIAGNOSIS — T8241XA Breakdown (mechanical) of vascular dialysis catheter, initial encounter: Secondary | ICD-10-CM | POA: Diagnosis not present

## 2024-06-02 DIAGNOSIS — N179 Acute kidney failure, unspecified: Secondary | ICD-10-CM | POA: Diagnosis not present

## 2024-06-02 DIAGNOSIS — I5023 Acute on chronic systolic (congestive) heart failure: Secondary | ICD-10-CM | POA: Diagnosis not present

## 2024-06-02 DIAGNOSIS — T82590A Other mechanical complication of surgically created arteriovenous fistula, initial encounter: Secondary | ICD-10-CM | POA: Diagnosis not present

## 2024-06-02 DIAGNOSIS — N2581 Secondary hyperparathyroidism of renal origin: Secondary | ICD-10-CM | POA: Diagnosis not present

## 2024-06-02 DIAGNOSIS — R9431 Abnormal electrocardiogram [ECG] [EKG]: Secondary | ICD-10-CM | POA: Diagnosis not present

## 2024-06-02 DIAGNOSIS — E8770 Fluid overload, unspecified: Secondary | ICD-10-CM | POA: Diagnosis not present

## 2024-06-02 DIAGNOSIS — N186 End stage renal disease: Secondary | ICD-10-CM | POA: Diagnosis not present

## 2024-06-02 DIAGNOSIS — M329 Systemic lupus erythematosus, unspecified: Secondary | ICD-10-CM | POA: Diagnosis not present

## 2024-06-02 DIAGNOSIS — R229 Localized swelling, mass and lump, unspecified: Secondary | ICD-10-CM | POA: Diagnosis not present

## 2024-06-02 DIAGNOSIS — D75A Glucose-6-phosphate dehydrogenase (G6PD) deficiency without anemia: Secondary | ICD-10-CM | POA: Diagnosis not present

## 2024-06-02 DIAGNOSIS — Z5329 Procedure and treatment not carried out because of patient's decision for other reasons: Secondary | ICD-10-CM | POA: Diagnosis not present

## 2024-06-02 DIAGNOSIS — I132 Hypertensive heart and chronic kidney disease with heart failure and with stage 5 chronic kidney disease, or end stage renal disease: Secondary | ICD-10-CM | POA: Diagnosis not present

## 2024-06-02 DIAGNOSIS — I120 Hypertensive chronic kidney disease with stage 5 chronic kidney disease or end stage renal disease: Secondary | ICD-10-CM | POA: Diagnosis not present

## 2024-06-02 DIAGNOSIS — T82858A Stenosis of vascular prosthetic devices, implants and grafts, initial encounter: Secondary | ICD-10-CM | POA: Diagnosis not present

## 2024-06-02 DIAGNOSIS — T82510A Breakdown (mechanical) of surgically created arteriovenous fistula, initial encounter: Secondary | ICD-10-CM | POA: Diagnosis not present

## 2024-06-02 DIAGNOSIS — I82C11 Acute embolism and thrombosis of right internal jugular vein: Secondary | ICD-10-CM | POA: Diagnosis not present

## 2024-06-02 DIAGNOSIS — J811 Chronic pulmonary edema: Secondary | ICD-10-CM | POA: Diagnosis not present

## 2024-06-02 DIAGNOSIS — T82818A Embolism of vascular prosthetic devices, implants and grafts, initial encounter: Secondary | ICD-10-CM | POA: Diagnosis not present

## 2024-06-02 DIAGNOSIS — E871 Hypo-osmolality and hyponatremia: Secondary | ICD-10-CM | POA: Diagnosis not present

## 2024-06-02 DIAGNOSIS — J90 Pleural effusion, not elsewhere classified: Secondary | ICD-10-CM | POA: Diagnosis not present

## 2024-06-02 DIAGNOSIS — Z8679 Personal history of other diseases of the circulatory system: Secondary | ICD-10-CM | POA: Diagnosis not present

## 2024-06-02 DIAGNOSIS — D593 Hemolytic-uremic syndrome, unspecified: Secondary | ICD-10-CM | POA: Diagnosis not present

## 2024-06-02 DIAGNOSIS — D631 Anemia in chronic kidney disease: Secondary | ICD-10-CM | POA: Diagnosis not present

## 2024-06-02 DIAGNOSIS — Z992 Dependence on renal dialysis: Secondary | ICD-10-CM | POA: Diagnosis not present

## 2024-06-02 DIAGNOSIS — Z9981 Dependence on supplemental oxygen: Secondary | ICD-10-CM | POA: Diagnosis not present

## 2024-06-02 DIAGNOSIS — E875 Hyperkalemia: Secondary | ICD-10-CM | POA: Diagnosis not present

## 2024-06-02 DIAGNOSIS — K219 Gastro-esophageal reflux disease without esophagitis: Secondary | ICD-10-CM | POA: Diagnosis not present

## 2024-06-02 DIAGNOSIS — E162 Hypoglycemia, unspecified: Secondary | ICD-10-CM | POA: Diagnosis not present

## 2024-06-02 DIAGNOSIS — J810 Acute pulmonary edema: Secondary | ICD-10-CM | POA: Diagnosis not present

## 2024-06-02 DIAGNOSIS — L989 Disorder of the skin and subcutaneous tissue, unspecified: Secondary | ICD-10-CM | POA: Diagnosis not present

## 2024-06-02 DIAGNOSIS — T82510S Breakdown (mechanical) of surgically created arteriovenous fistula, sequela: Secondary | ICD-10-CM | POA: Diagnosis not present

## 2024-06-02 DIAGNOSIS — T82590S Other mechanical complication of surgically created arteriovenous fistula, sequela: Secondary | ICD-10-CM | POA: Diagnosis not present

## 2024-06-09 ENCOUNTER — Other Ambulatory Visit: Payer: Self-pay | Admitting: Family Medicine

## 2024-06-12 ENCOUNTER — Other Ambulatory Visit: Payer: Self-pay

## 2024-06-12 MED ORDER — FUROSEMIDE 80 MG PO TABS: 80.0000 mg | ORAL_TABLET | Freq: Every day | ORAL | 1 refills | Status: AC

## 2024-06-19 ENCOUNTER — Other Ambulatory Visit: Payer: Self-pay

## 2024-06-19 DIAGNOSIS — Z992 Dependence on renal dialysis: Secondary | ICD-10-CM

## 2024-06-20 ENCOUNTER — Ambulatory Visit (HOSPITAL_COMMUNITY)
Admission: RE | Admit: 2024-06-20 | Discharge: 2024-06-20 | Disposition: A | Discharge status: Home / Self Care | Source: Ambulatory Visit | Attending: Surgery | Admitting: Surgery

## 2024-06-20 ENCOUNTER — Ambulatory Visit (HOSPITAL_COMMUNITY): Admission: RE | Admit: 2024-06-20 | Discharge: 2024-06-20 | Attending: Surgery | Admitting: Surgery

## 2024-06-20 DIAGNOSIS — N186 End stage renal disease: Secondary | ICD-10-CM

## 2024-06-20 DIAGNOSIS — Z992 Dependence on renal dialysis: Secondary | ICD-10-CM | POA: Insufficient documentation

## 2024-06-26 DIAGNOSIS — N186 End stage renal disease: Secondary | ICD-10-CM | POA: Diagnosis not present

## 2024-06-26 DIAGNOSIS — Z992 Dependence on renal dialysis: Secondary | ICD-10-CM | POA: Diagnosis not present

## 2024-06-26 DIAGNOSIS — I120 Hypertensive chronic kidney disease with stage 5 chronic kidney disease or end stage renal disease: Secondary | ICD-10-CM | POA: Diagnosis not present

## 2024-07-17 ENCOUNTER — Ambulatory Visit: Admitting: Surgery

## 2024-07-31 ENCOUNTER — Encounter: Payer: Self-pay | Admitting: Surgery

## 2024-07-31 ENCOUNTER — Ambulatory Visit: Attending: Surgery | Admitting: Surgery

## 2024-07-31 VITALS — BP 126/86 | HR 71 | Temp 97.8°F | Ht 66.0 in | Wt 163.0 lb

## 2024-07-31 DIAGNOSIS — N186 End stage renal disease: Secondary | ICD-10-CM

## 2024-07-31 DIAGNOSIS — Z992 Dependence on renal dialysis: Secondary | ICD-10-CM | POA: Diagnosis not present

## 2024-07-31 NOTE — Progress Notes (Signed)
 Vascular and Vein Specialist of [facility]  Patient name: Linda [name] MRN: [id number] DOB: 1996/02/08 Sex: female   REASON FOR VISIT:    Follow-up  HISOTRY OF PRESENT ILLNESS:    Linda [name] is a 28 y.o. female with lupus nephritis.  She has a left arm graft created by Dr. Marea in 2020.  She underwent a fistulogram in July 2025 secondary to clotting.  This showed a widely patent graft with stent in the left upper arm.  This occluded a few months ago.  She underwent multiple attempts at thrombectomy ultimately, she had a catheter placed.  She would like to keep access in her left arm   PAST MEDICAL HISTORY:   Past Medical History:  Diagnosis Date   Abnormal ECG    a. In setting of LVH w/ repolarization abnormalities; b. 03/2019 MV: No ischemia/infarct. EF 55-65%.   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 11/2017 Echo: EF 50-55%. triv AI. Mild MR. Mod L and mild R atrial enlargement. Nl RV size/fxn. Small pericardial effusion w/o tampondae; b. 03/2018 Echo: EF 55-60%, restrictive filling pattern. Nl RV size/fxn. Sev LAE. Mild MR, mild to mod TR/PR. Mod PAH. Triv effusion.   Migraines    Mixed connective tissue  disease    Positive D dimer 12/22/2023   Previous Dialysis patient [facility])    a. Off HD since late 2020.   Resistant hypertension    Septic prepatellar bursitis of right knee 08/19/2017   Severe sepsis (HCC) 01/06/2018     FAMILY HISTORY:   Family History  Problem Relation Age of Onset   Lupus Mother    Hypertension Mother    Kidney disease Mother    Heart Problems Mother    Coronary artery disease Mother 74       stent   Diabetes Maternal Grandmother     SOCIAL HISTORY:   Social History   Tobacco Use   Smoking status: Every Day    Types: E-cigarettes, Cigars   Smokeless tobacco: Never   Tobacco comments:    02/03/2024 Patient smokes 1 cigars daily since 2016  Substance Use Topics   Alcohol use: No    Alcohol/week: 0.0 standard drinks of alcohol     ALLERGIES:   Allergies  Allergen Reactions   Iodine  Hives and Shortness Of Breath   Lisinopril  Anaphylaxis    Angioedema   Gabapentin Other (See Comments)    Reaction: Tremor (intolerance); tremor   Ativan  [Lorazepam ] Other (See Comments)    Somnolent with 1mg  ativan  at [facility] Nov 2019. Patient stated it made her feel like she shut down and was mute and non-responding.   Hydroxychloroquine  Other (See Comments)    Pt reports making her skin peel really bad   Vicodin [Hydrocodone -Acetaminophen ] Itching and Rash  CURRENT MEDICATIONS:   Current Outpatient Medications  Medication Sig Dispense Refill   acetaminophen  (TYLENOL ) 325 MG tablet Take 2 tablets (650 mg total) by mouth every 6 (six) hours.     allopurinol  (ZYLOPRIM ) 100 MG tablet Take 1 tablet (100 mg total) by mouth daily. 30 tablet 0   amLODipine  (NORVASC ) 10 MG tablet Take 1 tablet (10 mg total) by mouth at bedtime. 90 tablet 1   Blood Pressure KIT Uncontrolled blood pressure 1 kit 0   Blood Pressure Monitor MISC Use to check blood pressure 1 each 0   carvedilol  (COREG ) 25 MG tablet Take 1 tablet (25 mg total) by mouth 2 (two) times daily with a  meal. 180 tablet 1   cloNIDine  (CATAPRES ) 0.1 MG tablet Take 1 tablet (0.1 mg total) by mouth daily. 30 tablet 0   doxazosin  (CARDURA ) 2 MG tablet Take 1 tablet (2 mg total) by mouth daily. 30 tablet 0   furosemide  (LASIX ) 80 MG tablet Take 1 tablet (80 mg total) by mouth daily. 90 tablet 1   irbesartan  (AVAPRO ) 300 MG tablet Take 1 tablet (300 mg total) by mouth daily. 90 tablet 3   lidocaine -prilocaine  (EMLA ) cream Apply 1 Application topically daily as needed (for dialysis).     magic mouthwash (lidocaine , diphenhydrAMINE , alum & mag hydroxide) suspension Swish and spit 5 mLs 4 (four) times daily as needed for mouth pain. 360 mL 0   nitroGLYCERIN  (NITROSTAT ) 0.4 MG SL tablet Place 1 tablet (0.4 mg total) under the tongue every 5 (five) minutes as needed for chest pain. (Patient not taking: Reported on 05/26/2024) 30 tablet 12   ondansetron  (ZOFRAN ) 4 MG tablet Take 1 tablet (4 mg total) by mouth every 6 (six) hours. 12 tablet 0   sevelamer  carbonate (RENVELA ) 2.4 g PACK Take 2.4 g by mouth daily.     tiZANidine  (ZANAFLEX ) 4 MG tablet TAKE 1 TABLET (4 MG TOTAL) BY MOUTH DAILY AS NEEDED FOR MUSCLE SPASM 90 tablet 1   No current facility-administered medications for this visit.    REVIEW OF SYSTEMS:   [X]  denotes positive finding, [ ]  denotes negative finding Cardiac  Comments:  Chest pain or chest pressure:    Shortness of breath upon exertion:    Short of breath when lying flat:    Irregular heart rhythm:        Vascular    Pain in calf, thigh, or hip brought on by ambulation:    Pain in feet at night that wakes you up from your sleep:     Blood clot in your veins:    Leg swelling:         Pulmonary    Oxygen at home:    Productive cough:     Wheezing:         Neurologic    Sudden weakness in arms or legs:     Sudden numbness in arms or legs:     Sudden onset of difficulty speaking or slurred speech:    Temporary loss of vision in one eye:     Problems with dizziness:          Gastrointestinal    Blood in stool:     Vomited blood:         Genitourinary    Burning when urinating:     Blood in urine:        Psychiatric    Major depression:         Hematologic    Bleeding  problems:    Problems with blood clotting too easily:        Skin    Rashes or ulcers:        Constitutional    Fever or chills:      PHYSICAL EXAM:   Vitals:   07/31/24 1004  BP: 126/86  Pulse: 71  Temp: 97.8 F (36.6 C)  SpO2: 91%  Weight: 163 lb (73.9 kg)  Height: 5' 6 (1.676 m)    GENERAL: The patient is a well-nourished female, in no acute distress. The vital signs are documented above. CARDIAC: There is a regular rate and rhythm.  VASCULAR: Palpable left brachial and radial pulse.  SonoSite was used to evaluate the cephalic vein which appears to be of adequate caliber. PULMONARY: Non-labored respirations MUSCULOSKELETAL: There are no major deformities or cyanosis. NEUROLOGIC: No focal weakness or paresthesias are detected. SKIN: There are no ulcers or rashes noted. PSYCHIATRIC: The patient has a normal affect.  STUDIES:   I have reviewed the following: +---------+  Right Cephalic   Diameter (cm)Depth (cm)Findings   +-----------------+-------------+----------+---------+  Prox upper arm       0.34                          +-----------------+-------------+----------+---------+  Mid upper arm        0.24               branching  +-----------------+-------------+----------+---------+  Dist upper arm       0.24                          +-----------------+-------------+----------+---------+  Antecubital fossa    0.23                          +-----------------+-------------+----------+---------+  Prox forearm         0.20                          +-----------------+-------------+----------+---------+  Mid forearm          0.26                          +-----------------+-------------+----------+---------+  Dist forearm          0.19                          +-----------------+-------------+----------+---------+   +-----------------+-------------+----------+--------+  Right Basilic    Diameter (cm)Depth (cm)Findings  +-----------------+-------------+----------+--------+  Shoulder            0.38                         +-----------------+-------------+----------+--------+  Prox upper arm       0.35                         +-----------------+-------------+----------+--------+  Mid upper arm        0.55                         +-----------------+-------------+----------+--------+  Dist upper arm       0.43                         +-----------------+-------------+----------+--------+  Antecubital fossa    0.42                         +-----------------+-------------+----------+--------+  Prox forearm         0.36                         +-----------------+-------------+----------+--------+   +-----------------+-------------+----------+--------+  Left Cephalic    Diameter (cm)Depth (cm)Findings  +-----------------+-------------+----------+--------+  Prox upper arm       0.37                         +-----------------+-------------+----------+--------+  Mid upper arm        0.38                         +-----------------+-------------+----------+--------+  Dist upper arm       0.34                         +-----------------+-------------+----------+--------+  Antecubital fossa    0.29                         +-----------------+-------------+----------+--------+  Prox forearm         0.22                         +-----------------+-------------+----------+--------+  Mid forearm          0.28                         +-----------------+-------------+----------+--------+  Dist forearm         0.15                         +-----------------+-------------+----------+--------+   Summary: Right: Patent cephalic and basilic veins.  Left:  Patent cephalic vein. Thrombosed AVG noted.   MEDICAL ISSUES:   ESRD: I discussed proceeding with a left arm fistula versus graft.  Sono site showed that the cephalic vein appears to be adequate throughout the upper arm and so my first choice would be a left brachiocephalic fistula.  Alternatively I will look at the left basilic vein.  If this is not available, I do think she would be a candidate for a redo left upper arm graft.  She does have a stent in the upper arm however the vein is patent proximal to that and there does appear to be a paired vein in the upper arm that could be used as outflow.  This will be performed in the near future.  All questions were answered.    Malvina Serene CLORE, MD, FACS [facility] [phone] Pager [phone]

## 2024-08-01 ENCOUNTER — Other Ambulatory Visit: Payer: Self-pay

## 2024-08-01 DIAGNOSIS — N186 End stage renal disease: Secondary | ICD-10-CM

## 2024-08-04 ENCOUNTER — Encounter (HOSPITAL_COMMUNITY): Payer: Self-pay

## 2024-08-04 ENCOUNTER — Emergency Department (HOSPITAL_COMMUNITY)

## 2024-08-04 ENCOUNTER — Emergency Department (HOSPITAL_COMMUNITY)
Admission: EM | Admit: 2024-08-04 | Discharge: 2024-08-04 | Disposition: A | Discharge status: Home / Self Care | Attending: Emergency Medicine | Admitting: Emergency Medicine

## 2024-08-04 DIAGNOSIS — S83411A Sprain of medial collateral ligament of right knee, initial encounter: Secondary | ICD-10-CM

## 2024-08-04 DIAGNOSIS — S8991XA Unspecified injury of right lower leg, initial encounter: Secondary | ICD-10-CM

## 2024-08-04 LAB — COMPREHENSIVE METABOLIC PANEL WITH GFR
ALT: 13 U/L (ref 0–44)
AST: 17 U/L (ref 15–41)
Albumin: 3 g/dL — ABNORMAL LOW (ref 3.5–5.0)
Alkaline Phosphatase: 113 U/L (ref 38–126)
Anion gap: 16 — ABNORMAL HIGH (ref 5–15)
BUN: 44 mg/dL — ABNORMAL HIGH (ref 6–20)
CO2: 24 mmol/L (ref 22–32)
Calcium: 9.3 mg/dL (ref 8.9–10.3)
Chloride: 93 mmol/L — ABNORMAL LOW (ref 98–111)
Creatinine, Ser: 9.23 mg/dL — ABNORMAL HIGH (ref 0.44–1.00)
GFR, Estimated: 5 mL/min — ABNORMAL LOW (ref >60)
Glucose, Bld: 90 mg/dL (ref 70–99)
Potassium: 4.8 mmol/L (ref 3.5–5.1)
Sodium: 133 mmol/L — ABNORMAL LOW (ref 135–145)
Total Bilirubin: 1 mg/dL (ref 0.0–1.2)
Total Protein: 7.6 g/dL (ref 6.5–8.1)

## 2024-08-04 LAB — CBC WITH DIFFERENTIAL/PLATELET
Abs Immature Granulocytes: 0.03 K/uL (ref 0.00–0.07)
Basophils Absolute: 0 K/uL (ref 0.0–0.1)
Basophils Relative: 0 %
Eosinophils Absolute: 0.2 K/uL (ref 0.0–0.5)
Eosinophils Relative: 2 %
HCT: 30.4 % — ABNORMAL LOW (ref 36.0–46.0)
Hemoglobin: 9.4 g/dL — ABNORMAL LOW (ref 12.0–15.0)
Immature Granulocytes: 0 %
Lymphocytes Relative: 15 %
Lymphs Abs: 1.1 K/uL (ref 0.7–4.0)
MCH: 29.4 pg (ref 26.0–34.0)
MCHC: 30.9 g/dL (ref 30.0–36.0)
MCV: 95 fL (ref 80.0–100.0)
Monocytes Absolute: 1 K/uL (ref 0.1–1.0)
Monocytes Relative: 14 %
Neutro Abs: 4.9 K/uL (ref 1.7–7.7)
Neutrophils Relative %: 69 %
Platelets: 231 K/uL (ref 150–400)
RBC: 3.2 MIL/uL — ABNORMAL LOW (ref 3.87–5.11)
RDW: 18.7 % — ABNORMAL HIGH (ref 11.5–15.5)
Smear Review: NORMAL
WBC: 7.3 K/uL (ref 4.0–10.5)
nRBC: 0 % (ref 0.0–0.2)

## 2024-08-04 LAB — I-STAT CG4 LACTIC ACID, ED: Lactic Acid, Venous: 1.9 mmol/L (ref 0.5–1.9)

## 2024-08-04 MED ORDER — ONDANSETRON HCL 4 MG/2ML IJ SOLN: 4.0000 mg | Freq: Once | INTRAMUSCULAR | Status: DC

## 2024-08-04 MED ORDER — OXYCODONE HCL 5 MG PO TABS: 5.0000 mg | ORAL_TABLET | Freq: Four times a day (QID) | ORAL | 0 refills | Status: DC | PRN

## 2024-08-04 MED ORDER — OXYCODONE HCL 5 MG PO TABS
5.0000 mg | ORAL_TABLET | Freq: Once | ORAL | Status: DC
  Filled 2024-08-04: qty 1

## 2024-08-04 MED ORDER — MORPHINE SULFATE (PF) 2 MG/ML IV SOLN: 2.0000 mg | Freq: Once | INTRAVENOUS | Status: DC

## 2024-08-04 MED ORDER — MORPHINE SULFATE (PF) 2 MG/ML IV SOLN
2.0000 mg | Freq: Once | INTRAVENOUS | Status: AC
  Administered 2024-08-04: 2 mg via INTRAMUSCULAR
  Filled 2024-08-04: qty 1

## 2024-08-04 NOTE — Progress Notes (Signed)
 Orthopedic Tech Progress Note Patient Details:  Georgeanna Radziewicz Richelle December 26, 1995 [id number]  Ortho Devices Type of Ortho Device: Crutches, Knee Immobilizer Ortho Device/Splint Location: RLE Ortho Device/Splint Interventions: Ordered, Application, Adjustment   Post Interventions Patient Tolerated: Well Instructions Provided: Care of device, Adjustment of device, Poper ambulation with device  Adine MARLA Blush 08/04/2024, 5:29 PM

## 2024-08-04 NOTE — ED Triage Notes (Signed)
 Pt reports she fell going upstairs 5-6 days ago and landed on her right knee. Knee is swollen and warm to the touch. States swelling and pain has continued to increase. Hx dialysis, last dialysis was Wednesday. Was unable to go to dialysis today, can barely walk/bare weight on right leg.

## 2024-08-04 NOTE — Discharge Instructions (Signed)
 It was a pleasure caring for you today in the emergency department.  Please call Dr Sharl (orthopedics) office on Monday to arrange follow up  Please return to the emergency department for any worsening or worrisome symptoms.

## 2024-08-04 NOTE — ED Notes (Signed)
  Pt teaching provided on medications that may cause drowsiness. Pt instructed not to drive or operate heavy machinery while taking the prescribed medication. Pt verbalized understanding.   Pt provided discharge instructions and prescription information. Pt was given the opportunity to ask questions and questions were answered.

## 2024-08-04 NOTE — ED Triage Notes (Signed)
 Pt report falling two days ago, having Right knee pain, swelling, warm, tender and painful to touch.  Pt also report having a boil on her groin, started after the fall with secretions.

## 2024-08-04 NOTE — ED Provider Notes (Signed)
 [place] EMERGENCY DEPARTMENT AT [facility] Provider Note   CSN: [id number] Arrival date & time: 08/04/24  1420     Patient presents with: No chief complaint on file.   Linda [name] is a 28 y.o. female patient with history of lupus, hypertension, GERD who presents to the emergency department today for further evaluation of right knee pain that occurred 5 days ago.  Patient states she was walking up the stairs when her foot got caught on the step she fell forward walking up the steps missing 3 steps and landing on her right knee.  It has been progressively worsening and severe to the point where she was unable to bear weight on it today.  She denies any fever or chills.  Patient able to bend the knee but is extremely painful.  Been using Tiger balm and Tylenol  with little relief.   HPI     Prior to Admission medications   Medication Sig Start Date End Date Taking? Authorizing Provider  oxyCODONE  (ROXICODONE ) 5 MG immediate release tablet Take 1 tablet (5 mg total) by mouth every 6 (six) hours as needed for severe pain (pain score 7-10). 08/04/24  Yes Elnor Savant A, DO  acetaminophen  (TYLENOL ) 325 MG tablet Take 2 tablets (650 mg total) by mouth every 6 (six) hours. 03/11/24   Elgergawy, Brayton RAMAN, MD  allopurinol  (ZYLOPRIM ) 100 MG tablet Take 1 tablet (100 mg total) by mouth daily. 03/11/24   Elgergawy, Brayton RAMAN, MD  amLODipine  (NORVASC ) 10 MG tablet Take 1 tablet (10 mg total) by mouth at bedtime. 05/24/24   Laurence Locus, DO  Blood Pressure KIT Uncontrolled blood pressure 03/11/24   Elgergawy, Brayton RAMAN, MD  Blood Pressure Monitor MISC Use to check blood pressure 03/11/24   Elgergawy, Brayton RAMAN, MD  carvedilol  (COREG ) 25 MG tablet Take 1 tablet (25 mg total) by mouth 2 (two) times daily with a meal. 05/17/24   [name], NP  cloNIDine  (CATAPRES ) 0.1 MG tablet Take 1 tablet (0.1 mg total) by mouth daily. 03/11/24   Elgergawy, Brayton RAMAN, MD  doxazosin  (CARDURA ) 2 MG  tablet Take 1 tablet (2 mg total) by mouth daily. 05/24/24 06/23/24  Laurence Locus, DO  furosemide  (LASIX ) 80 MG tablet Take 1 tablet (80 mg total) by mouth daily. 06/12/24   [name], NP  irbesartan  (AVAPRO ) 300 MG tablet Take 1 tablet (300 mg total) by mouth daily. 05/17/24   Williamson, Joanna R, NP  lidocaine -prilocaine  (EMLA ) cream Apply 1 Application topically daily as needed (for dialysis). 01/14/24   [provider]  magic mouthwash (lidocaine , diphenhydrAMINE , alum & mag hydroxide) suspension Swish and spit 5 mLs 4 (four) times daily as needed for mouth pain. 05/25/24   Laurence Locus, DO  nitroGLYCERIN  (NITROSTAT ) 0.4 MG SL tablet Place 1 tablet (0.4 mg total) under the tongue every 5 (five) minutes as needed for chest pain. Patient not taking: Reported on 05/26/2024 10/28/23   Rai, Nydia POUR, MD  ondansetron  (ZOFRAN ) 4 MG tablet Take 1 tablet (4 mg total) by mouth every 6 (six) hours. 04/06/24   Robinson, John K, PA-C  sevelamer  carbonate (RENVELA ) 2.4 g PACK Take 2.4 g by mouth daily. 01/21/24   [provider]  tiZANidine  (ZANAFLEX ) 4 MG tablet TAKE 1 TABLET (4 MG TOTAL) BY MOUTH DAILY AS NEEDED FOR MUSCLE SPASM 06/09/24   Williamson, Joanna R, NP    Allergies: Iodine , Lisinopril , Gabapentin, Ativan  [lorazepam ], Hydroxychloroquine , and Vicodin [hydrocodone -acetaminophen ]    Review of  Systems  All other systems reviewed and are negative.   Updated Vital Signs BP (!) 118/92 (BP Location: Right Arm)   Pulse 78   Temp 98.1 F (36.7 C) (Oral)   Resp 18   SpO2 99%   Physical Exam Vitals and nursing note reviewed.  Constitutional:      Appearance: Normal appearance.  HENT:     Head: Normocephalic and atraumatic.  Eyes:     General:        Right eye: No discharge.        Left eye: No discharge.     Conjunctiva/sclera: Conjunctivae normal.  Pulmonary:     Effort: Pulmonary effort is normal.  Musculoskeletal:     Comments: Moderate swelling to the right knee.   Distal compartments soft.  Sensations intact.  2+ dorsalis pedis pulses felt in both feet.  Skin:    General: Skin is warm and dry.     Findings: No rash.  Neurological:     General: No focal deficit present.     Mental Status: She is alert.  Psychiatric:        Mood and Affect: Mood normal.        Behavior: Behavior normal.     (all labs ordered are listed, but only abnormal results are displayed) Labs Reviewed  COMPREHENSIVE METABOLIC PANEL WITH GFR - Abnormal; Notable for the following components:      Result Value   Sodium 133 (*)    Chloride 93 (*)    BUN 44 (*)    Creatinine, Ser 9.23 (*)    Albumin  3.0 (*)    GFR, Estimated 5 (*)    Anion gap 16 (*)    All other components within normal limits  CBC WITH DIFFERENTIAL/PLATELET - Abnormal; Notable for the following components:   RBC 3.20 (*)    Hemoglobin 9.4 (*)    HCT 30.4 (*)    RDW 18.7 (*)    All other components within normal limits  I-STAT CG4 LACTIC ACID, ED  I-STAT CG4 LACTIC ACID, ED    EKG: None  Radiology: DG Knee Complete 4 Views Right Result Date: 08/04/2024 EXAM: 4 OR MORE VIEW(S) XRAY OF THE RIGHT KNEE 08/04/2024 03:13:50 PM COMPARISON: 08/02/2017 CLINICAL HISTORY: right knee swollen, injury FINDINGS: BONES AND JOINTS: No acute fracture. No malalignment. No significant joint effusion. Extensive soft tissue calcifications are again noted consistent with history of connective tissue disease. SOFT TISSUES: Extensive soft tissue calcifications are again noted consistent with history of connective tissue disease. IMPRESSION: 1. No acute fracture or dislocation. 2. Extensive soft tissue calcifications, in keeping with connective tissue disease. Electronically signed by: Lynwood Seip MD 08/04/2024 03:23 PM EST RP Workstation: [id number]     Procedures   Medications Ordered in the ED  morphine  (PF) 2 MG/ML injection 2 mg (has no administration in time range)       Medical Decision Making Linda Johnnie  [name] is a 28 y.o. female patient who presents to the emergency department today for further evaluation of knee pain.  Low suspicion for septic joint at this time.  Patient does not have a white count.  She does have good range of motion.  She might have an MCL strain as she does primarily have tenderness there.  Mildly swollen.  Will give her some pain medication.  Will also get x-ray.  X-ray was negative.  Will have her follow-up with orthopedics.  Pain medication given.  Will also give her her prescription for  some pain medication as well.  Strict turn precautions were discussed.  Dr. Elnor my attending evaluate the patient at bedside and agrees with plan.  She is safe for discharge at this time.   Amount and/or Complexity of Data Reviewed Labs: ordered. Radiology: ordered.  Risk Prescription drug management.       Final diagnoses:  Injury of right knee, initial encounter  Sprain of medial collateral ligament of right knee, initial encounter    ED Discharge Orders          Ordered    oxyCODONE  (ROXICODONE ) 5 MG immediate release tablet  Every 6 hours PRN        08/04/24 1656               Theotis Cameron HERO, PA-C 08/04/24 1659    Elnor Jayson LABOR, DO 08/08/24 [phone]

## 2024-08-30 NOTE — Anesthesia Preprocedure Evaluation (Addendum)
 "                                  Anesthesia Evaluation  Patient identified by MRN, date of birth, ID band Patient awake    Reviewed: Allergy & Precautions, H&P , NPO status , Patient's Chart, lab work & pertinent test results  History of Anesthesia Complications Negative for: history of anesthetic complications  Airway Mallampati: I       Dental  (+) Dental Advisory Given   Pulmonary neg sleep apnea, neg recent URI, Current Smoker and Patient abstained from smoking.   breath sounds clear to auscultation       Cardiovascular hypertension,  Rhythm:Regular Rate:Normal  TTE 2025: 1. Left ventricular ejection fraction, by estimation, is 30 to 35%. The  left ventricle has moderately decreased function. The left ventricle  demonstrates global hypokinesis. The left ventricular internal cavity size  was moderately dilated. There is mild   left ventricular hypertrophy. Left ventricular diastolic parameters are  indeterminate.   2. Right ventricular systolic function is normal. The right ventricular  size is normal.   3. Left atrial size was moderately dilated.   4. Right atrial size was moderately dilated.   5. The mitral valve is abnormal. Mild mitral valve regurgitation. No  evidence of mitral stenosis.   6. Tricuspid valve regurgitation is moderate.   7. The aortic valve is normal in structure. Aortic valve regurgitation is  not visualized. No aortic stenosis is present.   8. The inferior vena cava is normal in size with greater than 50%  respiratory variability, suggesting right atrial pressure of 3 mmHg.     Neuro/Psych  Headaches, neg Seizures PSYCHIATRIC DISORDERS Anxiety        GI/Hepatic Neg liver ROS,GERD  ,,  Endo/Other  negative endocrine ROSType 2    Renal/GU ESRF and DialysisRenal disease  negative genitourinary   Musculoskeletal negative musculoskeletal ROS (+)    Abdominal   Peds negative pediatric ROS (+)  Hematology  (+) Blood  dyscrasia, anemia   Anesthesia Other Findings SLE, Complex Pain Syndrome  Reproductive/Obstetrics negative OB ROS                              Anesthesia Physical Anesthesia Plan  ASA: 3  Anesthesia Plan: General   Post-op Pain Management:    Induction: Intravenous  PONV Risk Score and Plan: 2 and Ondansetron , Midazolam  and Treatment may vary due to age or medical condition  Airway Management Planned: Oral ETT  Additional Equipment:   Intra-op Plan:   Post-operative Plan: Extubation in OR  Informed Consent:      Dental advisory given  Plan Discussed with:   Anesthesia Plan Comments: (PAT note by Lynwood Hope, PA-C: 28 year old female with pertinent history including HTN, G6PD deficiency, HFrEF, lupus nephritis, ESRD on HD Monday Wednesday Friday.  She has a left upper arm graft that clotted in October 2025.  She has a catheter in place.  Follows with cardiology for history of HFrEF felt secondary to severe hypertensive cardiomyopathy.  Most recent echo 03/06/2024 showed LVEF 30 to 35%, global hypokinesis, normal RV, mild mitral regurgitation, moderate tricuspid regurgitation.  Prior nuclear stress 03/07/2019 was low risk.  Last seen by Dr. Ozell Fell 03/08/2024 during admission for acute hypoxic respiratory failure secondary to volume overload with pulmonary edema in setting of missed HD sessions.  Dr. Fell  recommended continue volume management with hemodialysis. Management of HTN deferred to nephrology. No other input from cardiology standpoint.  She will need day of surgery labs and evaluation.  EKG 05/29/2024: NSR.  Rate 73.  Biatrial enlargement.  Incomplete right bundle-branch block.  Minimal voltage criteria for LVH, may be normal variant.  TTE 03/06/2024:  1. Left ventricular ejection fraction, by estimation, is 30 to 35%. The  left ventricle has moderately decreased function. The left ventricle  demonstrates global hypokinesis. The left  ventricular internal cavity size  was moderately dilated. There is mild   left ventricular hypertrophy. Left ventricular diastolic parameters are  indeterminate.   2. Right ventricular systolic function is normal. The right ventricular  size is normal.   3. Left atrial size was moderately dilated.   4. Right atrial size was moderately dilated.   5. The mitral valve is abnormal. Mild mitral valve regurgitation. No  evidence of mitral stenosis.   6. Tricuspid valve regurgitation is moderate.   7. The aortic valve is normal in structure. Aortic valve regurgitation is  not visualized. No aortic stenosis is present.   8. The inferior vena cava is normal in size with greater than 50%  respiratory variability, suggesting right atrial pressure of 3 mmHg.   Nuclear stress 03/07/2019:  Low risk, probably normal pharmacologic myocardial perfusion stress test.  There is no significant ischemia or scar.  Calculated left ventricular systolic function is mildly to moderately reduced but is likely affected by significant extracardiac activity (48% by Siemens calculation, 38% by QGS). LVEF appears normal by visual estimation.  There is no significant coronary artery calcification noted on the attenuation correction CT.  Central venous catheter is noted in the SVC.  )         Anesthesia Quick Evaluation  "

## 2024-08-30 NOTE — Progress Notes (Signed)
 Tarea Skillman                                          MRN: [id number]   08/30/2024   The [facility] Quality Team Specialist reviewed this patient medical record for the purposes of chart review for care gap closure. The following were reviewed: chart review for care gap closure-diabetic eye exam.    [facility] Quality Team

## 2024-08-30 NOTE — Progress Notes (Signed)
 Anesthesia Chart Review: Same day workup  28 year old female with pertinent history including HTN, G6PD deficiency, HFrEF, lupus nephritis, ESRD on HD Monday Wednesday Friday.  She has a left upper arm graft that clotted in October 2025.  She has a catheter in place.  Follows with cardiology for history of HFrEF felt secondary to severe hypertensive cardiomyopathy.  Most recent echo 03/06/2024 showed LVEF 30 to 35%, global hypokinesis, normal RV, mild mitral regurgitation, moderate tricuspid regurgitation.  Prior nuclear stress 03/07/2019 was low risk.  Last seen by Dr. Ozell Fell 03/08/2024 during admission for acute hypoxic respiratory failure secondary to volume overload with pulmonary edema in setting of missed HD sessions.  Dr. Fell recommended continue volume management with hemodialysis. Management of HTN deferred to nephrology. No other input from cardiology standpoint.  She will need day of surgery labs and evaluation.  EKG 05/29/2024: NSR.  Rate 73.  Biatrial enlargement.  Incomplete right bundle-branch block.  Minimal voltage criteria for LVH, may be normal variant.  TTE 03/06/2024:  1. Left ventricular ejection fraction, by estimation, is 30 to 35%. The  left ventricle has moderately decreased function. The left ventricle  demonstrates global hypokinesis. The left ventricular internal cavity size  was moderately dilated. There is mild   left ventricular hypertrophy. Left ventricular diastolic parameters are  indeterminate.   2. Right ventricular systolic function is normal. The right ventricular  size is normal.   3. Left atrial size was moderately dilated.   4. Right atrial size was moderately dilated.   5. The mitral valve is abnormal. Mild mitral valve regurgitation. No  evidence of mitral stenosis.   6. Tricuspid valve regurgitation is moderate.   7. The aortic valve is normal in structure. Aortic valve regurgitation is  not visualized. No aortic stenosis is present.   8.  The inferior vena cava is normal in size with greater than 50%  respiratory variability, suggesting right atrial pressure of 3 mmHg.   Nuclear stress 03/07/2019: Low risk, probably normal pharmacologic myocardial perfusion stress test. There is no significant ischemia or scar. Calculated left ventricular systolic function is mildly to moderately reduced but is likely affected by significant extracardiac activity (48% by Siemens calculation, 38% by QGS). LVEF appears normal by visual estimation. There is no significant coronary artery calcification noted on the attenuation correction CT. Central venous catheter is noted in the SVC.    Lynwood Geofm RIGGERS [facility] Short Stay Center/Anesthesiology Phone [phone] 08/30/2024 11:53 AM

## 2024-09-01 ENCOUNTER — Other Ambulatory Visit: Payer: Self-pay

## 2024-09-01 ENCOUNTER — Encounter (HOSPITAL_COMMUNITY): Payer: Self-pay | Admitting: Surgery

## 2024-09-01 NOTE — Pre-Procedure Instructions (Signed)
-------------    SDW INSTRUCTIONS given:  Your procedure is scheduled on 1/7.  Report to [facility] Entrance A at 08:00 A.M., and check in at the Admitting office.  Any questions or running late day of surgery: call [phone]    Remember:  Do not eat or drink after midnight the night before your surgery     Take these medicines the morning of surgery with A SIP OF WATER   carvedilol  (COREG )  cloNIDine  (CATAPRES )    May take these medicines IF NEEDED: ondansetron  (ZOFRAN )  oxyCODONE  (ROXICODONE )  tiZANidine  (ZANAFLEX )    As of today, STOP taking any Aspirin (unless otherwise instructed by your surgeon) Aleve, Naproxen, Ibuprofen , Motrin , Advil , Goody's, BC's, all herbal medications, fish oil, and all vitamins.   Do NOT Smoke (Tobacco/Vaping) 24 hours prior to your procedure  If you use a CPAP at night, you may bring all equipment for your overnight stay.     You will be asked to remove any contacts, glasses, piercing's, hearing aid's, dentures/partials prior to surgery. Please bring cases for these items if needed.     Patients discharged the day of surgery will not be allowed to drive home, and someone needs to stay with them for 24 hours.  SURGICAL WAITING ROOM VISITATION Patients may have no more than 2 support people in the waiting area - these visitors may rotate.   Pre-op  nurse will coordinate an appropriate time for 1 ADULT support person, who may not rotate, to accompany patient in pre-op .  Children under the age of 22 must have an adult with them who is not the patient and must remain in the main waiting area with an adult.  If the patient needs to stay at the hospital during part of their recovery, the visitor guidelines for inpatient rooms apply.  Please refer to the [facility] website for the visitor guidelines for any additional information.   Special instructions:   [place]- Preparing For Surgery   Please follow these instructions  carefully.   Shower the NIGHT BEFORE SURGERY and the MORNING OF SURGERY with DIAL  Soap.   Pat yourself dry with a CLEAN TOWEL.  Wear CLEAN PAJAMAS to bed the night before surgery  Place CLEAN SHEETS on your bed the night of your first shower and DO NOT SLEEP WITH PETS.   Additional instructions for the day of surgery: DO NOT APPLY any lotions, deodorants, cologne, or perfumes.   Do not wear jewelry or makeup Do not wear nail polish, gel polish, artificial nails, or any other type of covering on natural nails (fingers and toes) Do not bring valuables to the hospital. [facility] is not responsible for valuables/personal belongings. Put on clean/comfortable clothes.  Please brush your teeth.  Ask your nurse before applying any prescription medications to the skin.

## 2024-09-01 NOTE — Progress Notes (Signed)
 PCP - Philippe Slade, NP Cardiologist - denies  PPM/ICD - denies   Chest x-ray - 05/29/24 EKG - 06/05/24 Stress Test - 2020 ECHO - 03/06/24 Cardiac Cath - denies  CPAP - denies  DM- denies  ASA/Blood Thinner Instructions: n/a   ERAS Protcol - no, NPO  COVID TEST- n/a  Anesthesia review: yes, reviewed 12/31  Patient verbally denies any shortness of breath, fever, cough and chest pain during phone call      Questions were answered. Patient verbalized understanding of instructions.

## 2024-09-06 ENCOUNTER — Other Ambulatory Visit (HOSPITAL_COMMUNITY): Payer: Self-pay

## 2024-09-06 ENCOUNTER — Encounter (HOSPITAL_COMMUNITY)
Admission: RE | Disposition: A | Discharge status: Home / Self Care | Payer: Self-pay | Source: Home / Self Care | Attending: Surgery

## 2024-09-06 ENCOUNTER — Ambulatory Visit (HOSPITAL_COMMUNITY): Admitting: Physician Assistant

## 2024-09-06 ENCOUNTER — Encounter (HOSPITAL_COMMUNITY): Admitting: Physician Assistant

## 2024-09-06 ENCOUNTER — Ambulatory Visit (HOSPITAL_COMMUNITY)
Admission: RE | Admit: 2024-09-06 | Discharge: 2024-09-06 | Disposition: A | Discharge status: Home / Self Care | Attending: Surgery | Admitting: Surgery

## 2024-09-06 DIAGNOSIS — I5020 Unspecified systolic (congestive) heart failure: Secondary | ICD-10-CM | POA: Diagnosis not present

## 2024-09-06 DIAGNOSIS — Z79899 Other long term (current) drug therapy: Secondary | ICD-10-CM | POA: Insufficient documentation

## 2024-09-06 DIAGNOSIS — D631 Anemia in chronic kidney disease: Secondary | ICD-10-CM | POA: Insufficient documentation

## 2024-09-06 DIAGNOSIS — Z833 Family history of diabetes mellitus: Secondary | ICD-10-CM | POA: Diagnosis not present

## 2024-09-06 DIAGNOSIS — T82898A Other specified complication of vascular prosthetic devices, implants and grafts, initial encounter: Secondary | ICD-10-CM | POA: Diagnosis present

## 2024-09-06 DIAGNOSIS — N186 End stage renal disease: Secondary | ICD-10-CM | POA: Diagnosis not present

## 2024-09-06 DIAGNOSIS — I132 Hypertensive heart and chronic kidney disease with heart failure and with stage 5 chronic kidney disease, or end stage renal disease: Secondary | ICD-10-CM

## 2024-09-06 DIAGNOSIS — I5022 Chronic systolic (congestive) heart failure: Secondary | ICD-10-CM | POA: Insufficient documentation

## 2024-09-06 DIAGNOSIS — T82848A Pain from vascular prosthetic devices, implants and grafts, initial encounter: Secondary | ICD-10-CM

## 2024-09-06 DIAGNOSIS — X58XXXA Exposure to other specified factors, initial encounter: Secondary | ICD-10-CM | POA: Diagnosis not present

## 2024-09-06 DIAGNOSIS — F17290 Nicotine dependence, other tobacco product, uncomplicated: Secondary | ICD-10-CM | POA: Insufficient documentation

## 2024-09-06 DIAGNOSIS — F419 Anxiety disorder, unspecified: Secondary | ICD-10-CM | POA: Diagnosis not present

## 2024-09-06 DIAGNOSIS — Z992 Dependence on renal dialysis: Secondary | ICD-10-CM | POA: Insufficient documentation

## 2024-09-06 DIAGNOSIS — F17210 Nicotine dependence, cigarettes, uncomplicated: Secondary | ICD-10-CM

## 2024-09-06 DIAGNOSIS — K219 Gastro-esophageal reflux disease without esophagitis: Secondary | ICD-10-CM | POA: Insufficient documentation

## 2024-09-06 DIAGNOSIS — R519 Headache, unspecified: Secondary | ICD-10-CM | POA: Diagnosis not present

## 2024-09-06 HISTORY — PX: AV FISTULA PLACEMENT: SHX1204

## 2024-09-06 HISTORY — PX: REMOVAL OF GRAFT: SHX6361

## 2024-09-06 LAB — POCT I-STAT, CHEM 8
BUN: 75 mg/dL — ABNORMAL HIGH (ref 6–20)
Calcium, Ion: 0.91 mmol/L — ABNORMAL LOW (ref 1.15–1.40)
Chloride: 111 mmol/L (ref 98–111)
Creatinine, Ser: 12.8 mg/dL — ABNORMAL HIGH (ref 0.44–1.00)
Glucose, Bld: 67 mg/dL — ABNORMAL LOW (ref 70–99)
HCT: 27 % — ABNORMAL LOW (ref 36.0–46.0)
Hemoglobin: 9.2 g/dL — ABNORMAL LOW (ref 12.0–15.0)
Potassium: 5.5 mmol/L — ABNORMAL HIGH (ref 3.5–5.1)
Sodium: 132 mmol/L — ABNORMAL LOW (ref 135–145)
TCO2: 14 mmol/L — ABNORMAL LOW (ref 22–32)

## 2024-09-06 LAB — HCG, SERUM, QUALITATIVE: Preg, Serum: NEGATIVE

## 2024-09-06 MED ORDER — HEPARIN 6000 UNIT IRRIGATION SOLUTION
Status: DC | PRN
  Administered 2024-09-06: 1

## 2024-09-06 MED ORDER — CHLORHEXIDINE GLUCONATE 0.12 % MT SOLN
OROMUCOSAL | Status: AC
  Administered 2024-09-06: 15 mL
  Filled 2024-09-06: qty 15

## 2024-09-06 MED ORDER — CEFAZOLIN SODIUM-DEXTROSE 2-4 GM/100ML-% IV SOLN
2.0000 g | INTRAVENOUS | Status: AC
  Administered 2024-09-06: 2 g via INTRAVENOUS
  Filled 2024-09-06: qty 100

## 2024-09-06 MED ORDER — SODIUM CHLORIDE 0.9 % IV SOLN: INTRAVENOUS | Status: DC

## 2024-09-06 MED ORDER — MIDAZOLAM HCL 2 MG/2ML IJ SOLN
INTRAMUSCULAR | Status: AC
  Filled 2024-09-06: qty 2

## 2024-09-06 MED ORDER — LACTATED RINGERS IV SOLN: INTRAVENOUS | Status: DC

## 2024-09-06 MED ORDER — ROCURONIUM BROMIDE 10 MG/ML (PF) SYRINGE
PREFILLED_SYRINGE | INTRAVENOUS | Status: AC
  Filled 2024-09-06: qty 10

## 2024-09-06 MED ORDER — FENTANYL CITRATE (PF) 250 MCG/5ML IJ SOLN
INTRAMUSCULAR | Status: DC | PRN
  Administered 2024-09-06: 100 ug via INTRAVENOUS

## 2024-09-06 MED ORDER — SUGAMMADEX SODIUM 200 MG/2ML IV SOLN
INTRAVENOUS | Status: DC | PRN
  Administered 2024-09-06: 200 mg via INTRAVENOUS

## 2024-09-06 MED ORDER — FENTANYL CITRATE (PF) 100 MCG/2ML IJ SOLN
25.0000 ug | INTRAMUSCULAR | Status: DC | PRN
  Administered 2024-09-06 (×2): 50 ug via INTRAVENOUS

## 2024-09-06 MED ORDER — ORAL CARE MOUTH RINSE: 15.0000 mL | Freq: Once | OROMUCOSAL | Status: DC

## 2024-09-06 MED ORDER — HEPARIN SODIUM (PORCINE) 1000 UNIT/ML IJ SOLN
INTRAMUSCULAR | Status: AC
  Filled 2024-09-06: qty 20

## 2024-09-06 MED ORDER — PROPOFOL 10 MG/ML IV BOLUS
INTRAVENOUS | Status: DC | PRN
  Administered 2024-09-06: 100 mg via INTRAVENOUS
  Administered 2024-09-06: 50 mg via INTRAVENOUS

## 2024-09-06 MED ORDER — FENTANYL CITRATE (PF) 100 MCG/2ML IJ SOLN
INTRAMUSCULAR | Status: AC
  Filled 2024-09-06: qty 2

## 2024-09-06 MED ORDER — CHLORHEXIDINE GLUCONATE 4 % EX SOLN: 60.0000 mL | Freq: Once | CUTANEOUS | Status: DC

## 2024-09-06 MED ORDER — HEMOSTATIC AGENTS (NO CHARGE) OPTIME
TOPICAL | Status: DC | PRN
  Administered 2024-09-06: 1 via TOPICAL

## 2024-09-06 MED ORDER — HEPARIN 6000 UNIT IRRIGATION SOLUTION
Status: AC
  Filled 2024-09-06: qty 500

## 2024-09-06 MED ORDER — ONDANSETRON HCL 4 MG/2ML IJ SOLN
INTRAMUSCULAR | Status: DC | PRN
  Administered 2024-09-06: 4 mg via INTRAVENOUS

## 2024-09-06 MED ORDER — LIDOCAINE 2% (20 MG/ML) 5 ML SYRINGE
INTRAMUSCULAR | Status: DC | PRN
  Administered 2024-09-06: 100 mg via INTRAVENOUS

## 2024-09-06 MED ORDER — LIDOCAINE 2% (20 MG/ML) 5 ML SYRINGE
INTRAMUSCULAR | Status: AC
  Filled 2024-09-06: qty 5

## 2024-09-06 MED ORDER — CHLORHEXIDINE GLUCONATE 0.12 % MT SOLN: 15.0000 mL | Freq: Once | OROMUCOSAL | Status: DC

## 2024-09-06 MED ORDER — DIPHENHYDRAMINE HCL 50 MG/ML IJ SOLN
INTRAMUSCULAR | Status: AC
  Filled 2024-09-06: qty 1

## 2024-09-06 MED ORDER — PHENYLEPHRINE 80 MCG/ML (10ML) SYRINGE FOR IV PUSH (FOR BLOOD PRESSURE SUPPORT)
PREFILLED_SYRINGE | INTRAVENOUS | Status: AC
  Filled 2024-09-06: qty 10

## 2024-09-06 MED ORDER — DIPHENHYDRAMINE HCL 50 MG/ML IJ SOLN
12.5000 mg | Freq: Once | INTRAMUSCULAR | Status: AC
  Administered 2024-09-06: 12.5 mg via INTRAVENOUS

## 2024-09-06 MED ORDER — PHENYLEPHRINE 80 MCG/ML (10ML) SYRINGE FOR IV PUSH (FOR BLOOD PRESSURE SUPPORT)
PREFILLED_SYRINGE | INTRAVENOUS | Status: DC | PRN
  Administered 2024-09-06: 80 ug via INTRAVENOUS
  Administered 2024-09-06: 40 ug via INTRAVENOUS

## 2024-09-06 MED ORDER — 0.9 % SODIUM CHLORIDE (POUR BTL) OPTIME
TOPICAL | Status: DC | PRN
  Administered 2024-09-06: 1000 mL

## 2024-09-06 MED ORDER — LIDOCAINE-EPINEPHRINE (PF) 1 %-1:200000 IJ SOLN
INTRAMUSCULAR | Status: AC
  Filled 2024-09-06: qty 30

## 2024-09-06 MED ORDER — ROCURONIUM BROMIDE 10 MG/ML (PF) SYRINGE
PREFILLED_SYRINGE | INTRAVENOUS | Status: DC | PRN
  Administered 2024-09-06: 50 mg via INTRAVENOUS
  Administered 2024-09-06: 20 mg via INTRAVENOUS
  Administered 2024-09-06: 10 mg via INTRAVENOUS

## 2024-09-06 MED ORDER — PROPOFOL 10 MG/ML IV BOLUS
INTRAVENOUS | Status: AC
  Filled 2024-09-06: qty 20

## 2024-09-06 MED ORDER — OXYCODONE HCL 5 MG PO TABS
5.0000 mg | ORAL_TABLET | ORAL | 0 refills | Status: AC | PRN
  Filled 2024-09-06: qty 8, 2d supply, fill #0

## 2024-09-06 MED ORDER — PROPOFOL 1000 MG/100ML IV EMUL
INTRAVENOUS | Status: AC
  Filled 2024-09-06: qty 200

## 2024-09-06 MED ORDER — MIDAZOLAM HCL (PF) 2 MG/2ML IJ SOLN
INTRAMUSCULAR | Status: DC | PRN
  Administered 2024-09-06: 2 mg via INTRAVENOUS

## 2024-09-06 MED ORDER — ONDANSETRON HCL 4 MG/2ML IJ SOLN
INTRAMUSCULAR | Status: AC
  Filled 2024-09-06: qty 2

## 2024-09-06 NOTE — H&P (Signed)
 "                                 Vascular and Vein Specialist of [facility]   Patient name: Linda [name]         MRN: [id number]        DOB: 02/19/1996          Sex: female     REASON FOR VISIT:      Follow-up   HISOTRY OF PRESENT ILLNESS:      Linda [name] is a 29 y.o. female with lupus nephritis.  She has a left arm graft created by Dr. Marea in 2020.  She underwent a fistulogram in July 2025 secondary to clotting.  This showed a widely patent graft with stent in the left upper arm.  This occluded a few months ago.  She underwent multiple attempts at thrombectomy ultimately, she had a catheter placed.  She would like to keep access in her left arm     PAST MEDICAL HISTORY:        Past Medical History:  Diagnosis Date   Abnormal ECG      a. In setting of LVH w/ repolarization abnormalities; b. 03/2019 MV: No ischemia/infarct. EF 55-65%.   Abscess of buttock, right 01/11/2024   Acute hypoxic respiratory failure (HCC) 10/10/2023   Acute on chronic heart failure with mildly reduced ejection fraction (HFmrEF, 41-49%) (HCC) 10/10/2023   Acute pulmonary edema (HCC) 09/30/2021   Anaphylactic shock, unspecified, initial encounter 10/02/2021   Angioedema     Aspiration pneumonia (HCC) 03/07/2024   Encounter for screening for respiratory tuberculosis 10/02/2021   ESRD on hemodialysis (HCC)     G6PD deficiency     GERD (gastroesophageal reflux disease)     High anion gap metabolic acidosis 09/02/2021   HUS (hemolytic uremic syndrome), 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 11/2017 Echo: EF 50-55%. triv AI. Mild MR. Mod L and mild R atrial enlargement. Nl RV size/fxn. Small pericardial effusion w/o tampondae; b. 03/2018 Echo: EF 55-60%, restrictive filling pattern. Nl RV size/fxn. Sev LAE. Mild MR, mild to mod TR/PR. Mod PAH. Triv  effusion.   Migraines     Mixed connective tissue disease     Positive D dimer 12/22/2023   Previous D[facility])      a. Off HD since late 2020.   Resistant hypertension     Septic prepatellar bursitis of right knee 08/19/2017   Severe sepsis (HCC) 01/06/2018            FAMILY HISTORY:         Family History  Problem Relation Age of Onset   Lupus Mother     Hypertension Mother     Kidney disease Mother     Heart Problems Mother     Coronary artery disease Mother 32        stent   Diabetes Maternal Grandmother            SOCIAL HISTORY:    Social History         Tobacco Use   Smoking status: Every Day      Types: E-cigarettes, Cigars   Smokeless tobacco: Never   Tobacco comments:      02/03/2024 Patient  smokes 1 cigars daily since 2016  Substance Use Topics   Alcohol use: No      Alcohol/week: 0.0 standard drinks of alcohol        ALLERGIES:    Allergies       Allergies  Allergen Reactions   Iodine  Hives and Shortness Of Breath   Lisinopril  Anaphylaxis      Angioedema   Gabapentin Other (See Comments)      Reaction: Tremor (intolerance); tremor   Ativan  [Lorazepam ] Other (See Comments)      Somnolent with 1mg  ativan  at [facility] Nov 2019. Patient stated it made her feel like she shut down and was mute and non-responding.   Hydroxychloroquine  Other (See Comments)      Pt reports making her skin peel really bad   Vicodin [Hydrocodone -Acetaminophen ] Itching and Rash          CURRENT MEDICATIONS:          Current Outpatient Medications  Medication Sig Dispense Refill   acetaminophen  (TYLENOL ) 325 MG tablet Take 2 tablets (650 mg total) by mouth every 6 (six) hours.       allopurinol  (ZYLOPRIM ) 100 MG tablet Take 1 tablet (100 mg total) by mouth daily. 30 tablet 0   amLODipine  (NORVASC ) 10 MG tablet Take 1 tablet (10 mg total) by mouth at bedtime. 90 tablet 1   Blood Pressure KIT Uncontrolled blood pressure 1 kit 0   Blood Pressure  Monitor MISC Use to check blood pressure 1 each 0   carvedilol  (COREG ) 25 MG tablet Take 1 tablet (25 mg total) by mouth 2 (two) times daily with a meal. 180 tablet 1   cloNIDine  (CATAPRES ) 0.1 MG tablet Take 1 tablet (0.1 mg total) by mouth daily. 30 tablet 0   doxazosin  (CARDURA ) 2 MG tablet Take 1 tablet (2 mg total) by mouth daily. 30 tablet 0   furosemide  (LASIX ) 80 MG tablet Take 1 tablet (80 mg total) by mouth daily. 90 tablet 1   irbesartan  (AVAPRO ) 300 MG tablet Take 1 tablet (300 mg total) by mouth daily. 90 tablet 3   lidocaine -prilocaine  (EMLA ) cream Apply 1 Application topically daily as needed (for dialysis).       magic mouthwash (lidocaine , diphenhydrAMINE , alum & mag hydroxide) suspension Swish and spit 5 mLs 4 (four) times daily as needed for mouth pain. 360 mL 0   nitroGLYCERIN  (NITROSTAT ) 0.4 MG SL tablet Place 1 tablet (0.4 mg total) under the tongue every 5 (five) minutes as needed for chest pain. (Patient not taking: Reported on 05/26/2024) 30 tablet 12   ondansetron  (ZOFRAN ) 4 MG tablet Take 1 tablet (4 mg total) by mouth every 6 (six) hours. 12 tablet 0   sevelamer  carbonate (RENVELA ) 2.4 g PACK Take 2.4 g by mouth daily.       tiZANidine  (ZANAFLEX ) 4 MG tablet TAKE 1 TABLET (4 MG TOTAL) BY MOUTH DAILY AS NEEDED FOR MUSCLE SPASM 90 tablet 1      No current facility-administered medications for this visit.        REVIEW OF SYSTEMS:    [X]  denotes positive finding, [ ]  denotes negative finding Cardiac   Comments:  Chest pain or chest pressure:      Shortness of breath upon exertion:      Short of breath when lying flat:      Irregular heart rhythm:             Vascular      Pain in calf, thigh,  or hip brought on by ambulation:      Pain in feet at night that wakes you up from your sleep:       Blood clot in your veins:      Leg swelling:              Pulmonary      Oxygen at home:      Productive cough:       Wheezing:              Neurologic      Sudden  weakness in arms or legs:       Sudden numbness in arms or legs:       Sudden onset of difficulty speaking or slurred speech:      Temporary loss of vision in one eye:       Problems with dizziness:              Gastrointestinal      Blood in stool:       Vomited blood:              Genitourinary      Burning when urinating:       Blood in urine:             Psychiatric      Major depression:              Hematologic      Bleeding problems:      Problems with blood clotting too easily:             Skin      Rashes or ulcers:             Constitutional      Fever or chills:          PHYSICAL EXAM:       Vitals:    07/31/24 1004  BP: 126/86  Pulse: 71  Temp: 97.8 F (36.6 C)  SpO2: 91%  Weight: 163 lb (73.9 kg)  Height: 5' 6 (1.676 m)      GENERAL: The patient is a well-nourished female, in no acute distress. The vital signs are documented above. CARDIAC: There is a regular rate and rhythm.  VASCULAR: Palpable left brachial and radial pulse.  SonoSite was used to evaluate the cephalic vein which appears to be of adequate caliber. PULMONARY: Non-labored respirations MUSCULOSKELETAL: There are no major deformities or cyanosis. NEUROLOGIC: No focal weakness or paresthesias are detected. SKIN: There are no ulcers or rashes noted. PSYCHIATRIC: The patient has a normal affect.   STUDIES:    I have reviewed the following: +---------+  Right Cephalic   Diameter (cm)Depth (cm)Findings   +-----------------+-------------+----------+---------+  Prox upper arm       0.34                          +-----------------+-------------+----------+---------+  Mid upper arm        0.24               branching  +-----------------+-------------+----------+---------+  Dist upper arm       0.24                          +-----------------+-------------+----------+---------+  Antecubital fossa    0.23                           +-----------------+-------------+----------+---------+  Prox forearm         0.20                          +-----------------+-------------+----------+---------+  Mid forearm          0.26                          +-----------------+-------------+----------+---------+  Dist forearm         0.19                          +-----------------+-------------+----------+---------+   +-----------------+-------------+----------+--------+  Right Basilic    Diameter (cm)Depth (cm)Findings  +-----------------+-------------+----------+--------+  Shoulder            0.38                         +-----------------+-------------+----------+--------+  Prox upper arm       0.35                         +-----------------+-------------+----------+--------+  Mid upper arm        0.55                         +-----------------+-------------+----------+--------+  Dist upper arm       0.43                         +-----------------+-------------+----------+--------+  Antecubital fossa    0.42                         +-----------------+-------------+----------+--------+  Prox forearm         0.36                         +-----------------+-------------+----------+--------+   +-----------------+-------------+----------+--------+  Left Cephalic    Diameter (cm)Depth (cm)Findings  +-----------------+-------------+----------+--------+  Prox upper arm       0.37                         +-----------------+-------------+----------+--------+  Mid upper arm        0.38                         +-----------------+-------------+----------+--------+  Dist upper arm       0.34                         +-----------------+-------------+----------+--------+  Antecubital fossa    0.29                         +-----------------+-------------+----------+--------+  Prox forearm         0.22                          +-----------------+-------------+----------+--------+  Mid forearm          0.28                         +-----------------+-------------+----------+--------+  Dist forearm         0.15                         +-----------------+-------------+----------+--------+  Summary: Right: Patent cephalic and basilic veins.  Left: Patent cephalic vein. Thrombosed AVG noted.    MEDICAL ISSUES:    ESRD: I discussed proceeding with a left arm fistula versus graft.  Sono site showed that the cephalic vein appears to be adequate throughout the upper arm and so my first choice would be a left brachiocephalic fistula.  Alternatively I will look at the left basilic vein.  If this is not available, I do think she would be a candidate for a redo left upper arm graft.  She does have a stent in the upper arm however the vein is patent proximal to that and there does appear to be a paired vein in the upper arm that could be used as outflow.  This will be performed in the near future.  All questions were answered.       Malvina Serene CLORE, MD, FACS Vascular and Vein Specialists of [facility] [phone] Pager [phone]  "

## 2024-09-06 NOTE — Transfer of Care (Signed)
 Immediate Anesthesia Transfer of Care Note  Patient: Linda [name]  Procedure(s) Performed: BRACHIOBASILIC ARTERIOVENOUS (AV) FISTULA CREATION (Left) RESECTION, GRAFT (Left: Arm Upper)  Patient Location: PACU  Anesthesia Type:General  Level of Consciousness: awake, alert , and oriented  Airway & Oxygen Therapy: Patient Spontanous Breathing  Post-op Assessment: Report given to RN and Post -op Vital signs reviewed and stable  Post vital signs: Reviewed and stable  Last Vitals:  Vitals Value Taken Time  BP 151/82 09/06/24 12:18  Temp    Pulse 65 09/06/24 12:20  Resp 19 09/06/24 12:20  SpO2 93 % 09/06/24 12:20  Vitals shown include unfiled device data.  Last Pain:  Vitals:   09/06/24 0905  TempSrc:   PainSc: 7       Patients Stated Pain Goal: 3 (09/06/24 0905)  Complications: There were no known notable events for this encounter.

## 2024-09-06 NOTE — Progress Notes (Addendum)
 Pt receives out-pt HD at [facility], MWF, 0710 chair time. Will continue to assist as needed.   Linda [name] Dialysis nav [phone]  Noted that pt did not receive full tx of HD Monday or today. Nephrology informed, HD unit currently backed up and pt unable to treat shortly. Got pt an out-pt HD appt at her clinic for tomorrow at 0645. Contacted RN who spoke with pt and pt is agreeable. AVS updated and pt is to d/c and go to clinic tomorrow. Confirmed with clinic. No further support needed.

## 2024-09-06 NOTE — Anesthesia Postprocedure Evaluation (Signed)
"   Anesthesia Post Note  Patient: Linda [name]  Procedure(s) Performed: BRACHIOBASILIC ARTERIOVENOUS (AV) FISTULA CREATION (Left) RESECTION, GRAFT (Left: Arm Upper)     Patient location during evaluation: PACU Anesthesia Type: General Level of consciousness: awake and alert Pain management: pain level controlled Vital Signs Assessment: post-procedure vital signs reviewed and stable Respiratory status: spontaneous breathing, nonlabored ventilation, respiratory function stable and patient connected to nasal cannula oxygen Cardiovascular status: blood pressure returned to baseline and stable Postop Assessment: no apparent nausea or vomiting Anesthetic complications: no   There were no known notable events for this encounter.  Last Vitals:  Vitals:   09/06/24 1245 09/06/24 1300  BP: (!) 154/83 (!) 168/99  Pulse: 69 68  Resp: (!) 24 16  Temp:  36.6 C  SpO2: 94% 94%    Last Pain:  Vitals:   09/06/24 1244  TempSrc:   PainSc: 4                  Arria Naim [name]      "

## 2024-09-06 NOTE — Op Note (Signed)
" ° ° °  Patient name: Linda [name] MRN: [id number] DOB: Feb 19, 1996 Sex: female  09/06/2024 Pre-operative Diagnosis: ESRD Post-operative diagnosis:  Same Surgeon:  Malvina New Procedure:   #1: First stage left brachiobasilic fistula   #2: Resection of a portion of left upper arm Gore-Tex graft Anesthesia:  general Blood Loss:  250 Specimens:  none  Findings: Significant scar tissue within the arm.  The patient was having complaints of pain across her elbow where the previous graft was and so I removed a portion of this  Indications: Patient comes in today for new access in the left arm  Procedure:  The patient was identified in the holding area and taken to [facility] OR ROOM 12  The patient was then placed supine on the table. general anesthesia was administered.  The patient was prepped and draped in the usual sterile fashion.  A time out was called and antibiotics were administered.  Ultrasound was used to evaluate the surface veins in the left arm.  I felt the basilic vein was adequate for fistula creation.  I made a oblique incision just proximal to the antecubital crease.  I first dissected out the brachial artery.  There was a dense inflammatory reaction around the artery.  There were large venous tributaries which were interrupted causing some bleeding until it was controlled.  Ultimately the artery was exposed.  This was approximately a 7 mm artery without significant disease.  After it was mobilized, I identified the basilic vein.  It was fully mobilized throughout the width of the incision.  Several branches were ligated between silk ties.  The vein was marked for orientation and then ligated distally.  It distended nicely with heparin  saline.  Next, the brachial artery was occluded with vascular clamps and a #11 blade was used to make an arteriotomy.  The vein was cut to the appropriate length and spatulated to fit the side of the arteriotomy.  A running anastomosis was performed with 6-0  Prolene.  Prior to completion, the appropriate flushing maneuvers were performed and the anastomosis was completed.  I inspected the course of the vein to make sure there were no kinks.  Next, I extended the incision slightly lateral and dissected out the occluded Gore-Tex graft.  I resected approximately 3 cm of the graft where it crossed over the antecubital crease.  I left a cuff of the graft on the artery.  Once this was done, hemostasis was achieved.  The wound was irrigated.  I reapproximated the deep tissue with 3-0 Vicryl and skin with 4-0 Vicryl followed by Dermabond.  There were no immediate complications.   Disposition: To PACU stable.   ALONSO Malvina New, M.D., [facility] [place] Office: [phone] Pager:  [phone]  "

## 2024-09-06 NOTE — Discharge Instructions (Signed)
 "  [facility]  Discharge Instructions  AV Fistula or Graft Surgery for Dialysis Access  Please refer to the following instructions for your post-procedure care. Your surgeon or physician assistant will discuss any changes with you.  Activity  You may drive the day following your surgery, if you are comfortable and no longer taking prescription pain medication. Resume full activity as the soreness in your incision resolves.  Bathing/Showering  You may shower after you go home. Keep your incision dry for 48 hours. Do not soak in a bathtub, hot tub, or swim until the incision heals completely. You may not shower if you have a hemodialysis catheter.  Incision Care  Clean your incision with mild soap and water  after 48 hours. Pat the area dry with a clean towel. You do not need a bandage unless otherwise instructed. Do not apply any ointments or creams to your incision. You may have skin glue on your incision. Do not peel it off. It will come off on its own in about one week. Your arm may swell a bit after surgery. To reduce swelling use pillows to elevate your arm so it is above your heart. Your doctor will tell you if you need to lightly wrap your arm with an ACE bandage.  Diet  Resume your normal diet. There are not special food restrictions following this procedure. In order to heal from your surgery, it is CRITICAL to get adequate nutrition. Your body requires vitamins, minerals, and protein. Vegetables are the best source of vitamins and minerals. Vegetables also provide the perfect balance of protein. Processed food has little nutritional value, so try to avoid this.  Medications  Resume taking all of your medications. If your incision is causing pain, you may take over-the counter pain relievers such as acetaminophen  (Tylenol ). If you were prescribed a stronger pain medication, please be aware these medications can cause nausea and constipation. Prevent  nausea by taking the medication with a snack or meal. Avoid constipation by drinking plenty of fluids and eating foods with high amount of fiber, such as fruits, vegetables, and grains.  Do not take Tylenol  if you are taking prescription pain medications.  Follow up Your surgeon may want to see you in the office following your access surgery. If so, this will be arranged at the time of your surgery.  Please call us  immediately for any of the following conditions:  Increased pain, redness, drainage (pus) from your incision site Fever of 101 degrees or higher Severe or worsening pain at your incision site Hand pain or numbness.  Reduce your risk of vascular disease:  Stop smoking. If you would like help, call [facility] at 1-800-QUIT-NOW (6503412070) or [place] at 206-259-1628  Manage your cholesterol Maintain a desired weight Control your diabetes Keep your blood pressure down  Dialysis  It will take several weeks to several months for your new dialysis access to be ready for use. Your surgeon will determine when it is okay to use it. Your nephrologist will continue to direct your dialysis. You can continue to use your Permcath until your new access is ready for use.   09/06/2024 Linda [name] [id number] 1996/08/10  Surgeon(s): Serene Gaile ORN, MD  Procedures: BRACHIOBASILIC ARTERIOVENOUS (AV) FISTULA CREATION RESECTION, GRAFT   May stick graft immediately   May stick graft on designated area only:   x Do not stick fistula for 12 weeks    If you have any questions, please call the office  at [phone].  "

## 2024-09-06 NOTE — Progress Notes (Signed)
 Dr. Erma aware of patient's elevated B/P.  Also Dr. Erma aware that patient unable to urinate. Pregnancy serum sent to lab.

## 2024-09-07 ENCOUNTER — Encounter: Payer: Self-pay | Admitting: Surgery

## 2024-09-07 ENCOUNTER — Encounter (HOSPITAL_COMMUNITY): Payer: Self-pay | Admitting: Surgery

## 2024-09-08 ENCOUNTER — Encounter (HOSPITAL_COMMUNITY): Payer: Self-pay

## 2024-09-08 ENCOUNTER — Emergency Department (HOSPITAL_COMMUNITY)
Admission: EM | Admit: 2024-09-08 | Discharge: 2024-09-09 | Disposition: A | Attending: Emergency Medicine | Admitting: Emergency Medicine

## 2024-09-08 ENCOUNTER — Other Ambulatory Visit: Payer: Self-pay

## 2024-09-08 ENCOUNTER — Emergency Department (EMERGENCY_DEPARTMENT_HOSPITAL): Admit: 2024-09-08 | Discharge: 2024-09-08 | Disposition: A

## 2024-09-08 ENCOUNTER — Emergency Department (HOSPITAL_COMMUNITY)

## 2024-09-08 DIAGNOSIS — M79602 Pain in left arm: Secondary | ICD-10-CM | POA: Insufficient documentation

## 2024-09-08 DIAGNOSIS — M7989 Other specified soft tissue disorders: Secondary | ICD-10-CM

## 2024-09-08 DIAGNOSIS — Z79899 Other long term (current) drug therapy: Secondary | ICD-10-CM | POA: Insufficient documentation

## 2024-09-08 DIAGNOSIS — R112 Nausea with vomiting, unspecified: Secondary | ICD-10-CM | POA: Insufficient documentation

## 2024-09-08 DIAGNOSIS — I132 Hypertensive heart and chronic kidney disease with heart failure and with stage 5 chronic kidney disease, or end stage renal disease: Secondary | ICD-10-CM | POA: Diagnosis not present

## 2024-09-08 DIAGNOSIS — R197 Diarrhea, unspecified: Secondary | ICD-10-CM | POA: Diagnosis not present

## 2024-09-08 DIAGNOSIS — F17290 Nicotine dependence, other tobacco product, uncomplicated: Secondary | ICD-10-CM | POA: Insufficient documentation

## 2024-09-08 DIAGNOSIS — E875 Hyperkalemia: Secondary | ICD-10-CM | POA: Diagnosis not present

## 2024-09-08 DIAGNOSIS — R140 Abdominal distension (gaseous): Secondary | ICD-10-CM | POA: Diagnosis not present

## 2024-09-08 DIAGNOSIS — Z992 Dependence on renal dialysis: Secondary | ICD-10-CM | POA: Diagnosis not present

## 2024-09-08 DIAGNOSIS — D72829 Elevated white blood cell count, unspecified: Secondary | ICD-10-CM | POA: Insufficient documentation

## 2024-09-08 DIAGNOSIS — I5023 Acute on chronic systolic (congestive) heart failure: Secondary | ICD-10-CM | POA: Diagnosis not present

## 2024-09-08 DIAGNOSIS — N186 End stage renal disease: Secondary | ICD-10-CM | POA: Diagnosis not present

## 2024-09-08 LAB — COMPREHENSIVE METABOLIC PANEL WITH GFR
ALT: 5 U/L (ref 0–44)
AST: 17 U/L (ref 15–41)
Albumin: 3.1 g/dL — ABNORMAL LOW (ref 3.5–5.0)
Alkaline Phosphatase: 221 U/L — ABNORMAL HIGH (ref 38–126)
Anion gap: 23 — ABNORMAL HIGH (ref 5–15)
BUN: 82 mg/dL — ABNORMAL HIGH (ref 6–20)
CO2: 12 mmol/L — ABNORMAL LOW (ref 22–32)
Calcium: 9.7 mg/dL (ref 8.9–10.3)
Chloride: 97 mmol/L — ABNORMAL LOW (ref 98–111)
Creatinine, Ser: 12.7 mg/dL — ABNORMAL HIGH (ref 0.44–1.00)
GFR, Estimated: 4 mL/min — ABNORMAL LOW
Glucose, Bld: 76 mg/dL (ref 70–99)
Potassium: 6.5 mmol/L (ref 3.5–5.1)
Sodium: 131 mmol/L — ABNORMAL LOW (ref 135–145)
Total Bilirubin: 0.9 mg/dL (ref 0.0–1.2)
Total Protein: 8 g/dL (ref 6.5–8.1)

## 2024-09-08 LAB — CG4 I-STAT (LACTIC ACID)
Lactic Acid, Venous: 2.4 mmol/L (ref 0.5–1.9)
Lactic Acid, Venous: 1.5 mmol/L (ref 0.5–1.9)

## 2024-09-08 LAB — I-STAT CHEM 8, ED
BUN: 79 mg/dL — ABNORMAL HIGH (ref 6–20)
Calcium, Ion: 1.09 mmol/L — ABNORMAL LOW (ref 1.15–1.40)
Chloride: 106 mmol/L (ref 98–111)
Creatinine, Ser: 14.6 mg/dL — ABNORMAL HIGH (ref 0.44–1.00)
Glucose, Bld: 76 mg/dL (ref 70–99)
HCT: 35 % — ABNORMAL LOW (ref 36.0–46.0)
Hemoglobin: 11.9 g/dL — ABNORMAL LOW (ref 12.0–15.0)
Potassium: 6.2 mmol/L — ABNORMAL HIGH (ref 3.5–5.1)
Sodium: 132 mmol/L — ABNORMAL LOW (ref 135–145)
TCO2: 15 mmol/L — ABNORMAL LOW (ref 22–32)

## 2024-09-08 LAB — CBC WITH DIFFERENTIAL/PLATELET
Abs Immature Granulocytes: 0.11 K/uL — ABNORMAL HIGH (ref 0.00–0.07)
Basophils Absolute: 0 K/uL (ref 0.0–0.1)
Basophils Relative: 0 %
Eosinophils Absolute: 0 K/uL (ref 0.0–0.5)
Eosinophils Relative: 0 %
HCT: 33.8 % — ABNORMAL LOW (ref 36.0–46.0)
Hemoglobin: 10.2 g/dL — ABNORMAL LOW (ref 12.0–15.0)
Immature Granulocytes: 1 %
Lymphocytes Relative: 11 %
Lymphs Abs: 1.4 K/uL (ref 0.7–4.0)
MCH: 28.3 pg (ref 26.0–34.0)
MCHC: 30.2 g/dL (ref 30.0–36.0)
MCV: 93.9 fL (ref 80.0–100.0)
Monocytes Absolute: 0.9 K/uL (ref 0.1–1.0)
Monocytes Relative: 7 %
Neutro Abs: 10.8 K/uL — ABNORMAL HIGH (ref 1.7–7.7)
Neutrophils Relative %: 81 %
Platelets: 300 K/uL (ref 150–400)
RBC: 3.6 MIL/uL — ABNORMAL LOW (ref 3.87–5.11)
RDW: 21.5 % — ABNORMAL HIGH (ref 11.5–15.5)
WBC: 13.2 K/uL — ABNORMAL HIGH (ref 4.0–10.5)
nRBC: 0 % (ref 0.0–0.2)

## 2024-09-08 LAB — HEPATITIS B SURFACE ANTIGEN: Hepatitis B Surface Ag: NONREACTIVE

## 2024-09-08 LAB — HCG, SERUM, QUALITATIVE: Preg, Serum: NEGATIVE

## 2024-09-08 LAB — CBG MONITORING, ED: Glucose-Capillary: 104 mg/dL — ABNORMAL HIGH (ref 70–99)

## 2024-09-08 LAB — LIPASE, BLOOD: Lipase: <10 U/L — ABNORMAL LOW (ref 11–51)

## 2024-09-08 LAB — I-STAT CG4 LACTIC ACID, ED: Lactic Acid, Venous: 2.9 mmol/L (ref 0.5–1.9)

## 2024-09-08 MED ORDER — ONDANSETRON HCL 4 MG/2ML IJ SOLN
4.0000 mg | Freq: Once | INTRAMUSCULAR | Status: AC
  Administered 2024-09-08: 4 mg via INTRAVENOUS
  Filled 2024-09-08: qty 2

## 2024-09-08 MED ORDER — CHLORHEXIDINE GLUCONATE CLOTH 2 % EX PADS: 6.0000 | MEDICATED_PAD | Freq: Every day | CUTANEOUS | Status: DC

## 2024-09-08 MED ORDER — FENTANYL CITRATE (PF) 50 MCG/ML IJ SOSY
50.0000 ug | PREFILLED_SYRINGE | Freq: Once | INTRAMUSCULAR | Status: AC
  Administered 2024-09-08: 50 ug via INTRAVENOUS
  Filled 2024-09-08: qty 1

## 2024-09-08 MED ORDER — SODIUM ZIRCONIUM CYCLOSILICATE 5 G PO PACK
5.0000 g | PACK | Freq: Every day | ORAL | Status: DC
  Administered 2024-09-08: 5 g via ORAL
  Filled 2024-09-08 (×2): qty 1

## 2024-09-08 MED ORDER — LOPERAMIDE HCL 2 MG PO CAPS: 2.0000 mg | ORAL_CAPSULE | Freq: Once | ORAL | Status: DC

## 2024-09-08 MED ORDER — ALBUTEROL SULFATE (2.5 MG/3ML) 0.083% IN NEBU
10.0000 mg | INHALATION_SOLUTION | Freq: Once | RESPIRATORY_TRACT | Status: AC
  Administered 2024-09-08: 10 mg via RESPIRATORY_TRACT
  Filled 2024-09-08: qty 12

## 2024-09-08 MED ORDER — FENTANYL CITRATE (PF) 50 MCG/ML IJ SOSY
PREFILLED_SYRINGE | INTRAMUSCULAR | Status: AC
  Filled 2024-09-08: qty 1

## 2024-09-08 MED ORDER — CALCIUM GLUCONATE 10 % IV SOLN
1.0000 g | Freq: Once | INTRAVENOUS | Status: AC
  Administered 2024-09-08: 1 g via INTRAVENOUS
  Filled 2024-09-08: qty 10

## 2024-09-08 MED ORDER — SODIUM ZIRCONIUM CYCLOSILICATE 10 G PO PACK
10.0000 g | PACK | Freq: Once | ORAL | Status: DC
  Filled 2024-09-08: qty 1

## 2024-09-08 MED ORDER — DEXTROSE 50 % IV SOLN
1.0000 | Freq: Once | INTRAVENOUS | Status: AC
  Administered 2024-09-08: 50 mL via INTRAVENOUS
  Filled 2024-09-08: qty 50

## 2024-09-08 MED ORDER — SODIUM ZIRCONIUM CYCLOSILICATE 5 G PO PACK
5.0000 g | PACK | Freq: Once | ORAL | Status: AC
  Administered 2024-09-08: 5 g via ORAL
  Filled 2024-09-08: qty 1

## 2024-09-08 MED ORDER — INSULIN ASPART 100 UNIT/ML IV SOLN
5.0000 [IU] | Freq: Once | INTRAVENOUS | Status: AC
  Administered 2024-09-08: 5 [IU] via INTRAVENOUS
  Filled 2024-09-08: qty 5

## 2024-09-08 MED ORDER — LOPERAMIDE HCL 2 MG PO CAPS
ORAL_CAPSULE | ORAL | Status: AC
  Filled 2024-09-08: qty 1

## 2024-09-08 MED ORDER — FENTANYL CITRATE (PF) 50 MCG/ML IJ SOSY
50.0000 ug | PREFILLED_SYRINGE | Freq: Once | INTRAMUSCULAR | Status: AC
  Administered 2024-09-08: 50 ug via INTRAVENOUS

## 2024-09-08 MED ORDER — SODIUM BICARBONATE 8.4 % IV SOLN
50.0000 meq | Freq: Once | INTRAVENOUS | Status: AC
  Administered 2024-09-08: 50 meq via INTRAVENOUS
  Filled 2024-09-08: qty 50

## 2024-09-08 NOTE — ED Notes (Signed)
"  Patient transferred to dialysis.   "

## 2024-09-08 NOTE — ED Provider Notes (Signed)
 Handoff from Soto PA-C: Patient waiting to receive dialysis Patient may require pain management while waiting for treatment Patient should be cleared for discharge after dialysis Physical Exam  BP (!) 175/124   Pulse 73   Temp (!) 97.5 F (36.4 C) (Oral)   Resp 16   SpO2 100%   Physical Exam  Procedures  Procedures  ED Course / MDM   Medical Decision Making Amount and/or Complexity of Data Reviewed Labs:  Decision-making details documented in ED Course.  Risk OTC drugs. Prescription drug management.   Patient presents to the ED for: Dialysis  Clinical Course as of 09/08/24 1530  Fri Sep 08, 2024  1256 Lactic Acid, Venous(!!): 2.9 [JS]  1404 Potassium(!!): 6.5 Significantly elevated.  [JS]  1405 Creatinine(!): 12.70 Worsen than her baseline [JS]    Clinical Course User Index [JS] Soto, Johana, PA-C    ED Course / Reassessments: Problem List: Hemodialysis 29 year old female presented for dialysis treatment. Initial assessment included history, physical exam, and review of prior medical records.  Patient was monitored in the emergency department -antiemetics and analgesics were provided for symptomatic relief as needed prior to hemodialysis treatment.  Patient was taken by nephrology for hemodialysis and further evaluation.  Patient will be brought back down to the emergency department upon completion of dialysis treatment and will be further evaluated by a provider before discharge.  Disposition: Disposition: Transfer -transferred care to nephrology service, [place] MD for dialysis and further admission decision versus ED disposition  This note was produced using Dragon Medical voice recognition. While I have reviewed and verified all clinical information, transcription errors may remain.        Willma Duwaine CROME, [place] 09/08/24 2354    Ula Prentice SAUNDERS, MD 09/11/24 [phone]

## 2024-09-08 NOTE — Progress Notes (Signed)
 Left upper extremity venous duplex has been completed. Preliminary results can be found in CV Proc through chart review.  Results were given to Johana Soto PA.  09/08/2024 1:13 PM Cathlyn Collet RVT

## 2024-09-08 NOTE — ED Provider Triage Note (Signed)
 Emergency Medicine Provider Triage Evaluation Note  Linda [name] , a 29 y.o. female  was evaluated in triage.  Pt complains of n/v .  Pt has a hx of Lupus and ESRD on HD.  She said she's not gone to dialysis since 12/31.  Her left arm AVF graft was revised on 1/7 by Dr. Serene.  Today she woke up with n/v.  Review of Systems  Positive: N/v Negative: fevers  Physical Exam  SpO2 99%  Gen:   Awake.  Nauseous.    Resp:  Normal effort  MSK:   Moves extremities without difficulty  Other:    Medical Decision Making  Medically screening exam initiated at 10:34 AM.  Appropriate orders placed.  Linda [name] was informed that the remainder of the evaluation will be completed by another provider, this initial triage assessment does not replace that evaluation, and the importance of remaining in the ED until their evaluation is complete.     Dean Clarity, MD 09/08/24 1037

## 2024-09-08 NOTE — Consult Note (Addendum)
 " Hospital Consult   Reason for Consult:  L arm edema Requesting Physician:  ED MRN #:  [id number]  History of Present Illness: This is a 29 y.o. female with ESRD on HD.  She presents to the emergency department with left upper extremity edema.  She is status post first stage left brachiobasilic fistula creation with resection of a portion of her left arm AV graft by Dr. Serene on 09/06/2024.  She believes swelling increased shortly after surgery.  She has history of a cephalic arch stent.  Venous duplex negative for left upper extremity DVT.  She has a right IJ [facility] which she states still functions appropriately.  She does not take any blood thinners.  Past Medical History:  Diagnosis Date   Abnormal ECG    a. In setting of LVH w/ repolarization abnormalities; b. 03/2019 MV: No ischemia/infarct. EF 55-65%.   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 11/2017 Echo: EF 50-55%. triv AI. Mild MR. Mod L and mild R atrial enlargement. Nl RV size/fxn. Small pericardial effusion w/o tampondae; b. 03/2018 Echo: EF 55-60%, restrictive filling pattern. Nl RV size/fxn. Sev LAE. Mild MR, mild to mod TR/PR. Mod PAH. Triv effusion.   Migraines    Mixed connective tissue disease    Positive D  dimer 12/22/2023   Previous Dialysis patient [facility])    a. Off HD since late 2020.   Resistant hypertension    Septic prepatellar bursitis of right knee 08/19/2017   Severe sepsis (HCC) 01/06/2018    Past Surgical History:  Procedure Laterality Date   A/V FISTULAGRAM N/A 03/10/2024   Procedure: A/V Fistulagram;  Surgeon: Serene Gaile ORN, MD;  Location: [facility];  Service: Cardiovascular;  Laterality: N/A;   AV FISTULA PLACEMENT Left 09/06/2024   Procedure: BRACHIOBASILIC ARTERIOVENOUS (AV) FISTULA CREATION;  Surgeon: Serene Gaile ORN, MD;  Location: [facility] OR;  Service: Vascular;  Laterality: Left;   GRAFT APPLICATION Left 03/30/2019   Procedure: BRACHIAL CEPHALIC GRAFT CREATION;  Surgeon: Marea Selinda RAMAN, MD;  Location: [facility] ORS;  Service: Vascular;  Laterality: Left;   I & D EXTREMITY Right 08/06/2017   Procedure: IRRIGATION AND DEBRIDEMENT KNEE;  Surgeon: Kendal Franky SQUIBB, MD;  Location: [facility] OR;  Service: Orthopedics;  Laterality: Right;   IR AV DIALY SHUNT INTRO NEEDLE/INTRACATH INITIAL W/PTA/IMG LEFT  05/23/2024   IR REMOVAL TUN CV CATH W/O FL  05/24/2024   IR THROMBECTOMY AV FISTULA W/THROMBOLYSIS/PTA INC/SHUNT/IMG LEFT Left 05/30/2024   IR THROMBECTOMY AV FISTULA W/THROMBOLYSIS/PTA/STENT INC/SHUNT/IMG LT Left 05/18/2022   IR TUNNELED CENTRAL VENOUS CATH PLC W IMG  05/19/2024   IR US  GUIDE VASC ACCESS LEFT  05/18/2022   IR US  GUIDE VASC ACCESS LEFT  05/23/2024   IR US  GUIDE VASC ACCESS LEFT  05/30/2024  MULTIPLE TOOTH EXTRACTIONS     RADIOLOGY WITH ANESTHESIA N/A 05/23/2024   Procedure: RADIOLOGY WITH ANESTHESIA;  Surgeon: Jennefer Ester PARAS, MD;  Location: [facility] OR;  Service: Radiology;  Laterality: N/A;   RADIOLOGY WITH ANESTHESIA N/A 05/30/2024   Procedure: RADIOLOGY WITH ANESTHESIA;  Surgeon: Philip Cornet, MD;  Location: [facility] OR;  Service: Radiology;  Laterality: N/A;   REMOVAL OF GRAFT Left 09/06/2024   Procedure: RESECTION, GRAFT;  Surgeon: Serene Gaile ORN, MD;  Location: [facility] OR;  Service:  Vascular;  Laterality: Left;    Allergies[1]  Prior to Admission medications  Medication Sig Start Date End Date Taking? Authorizing Provider  acetaminophen  (TYLENOL ) 325 MG tablet Take 2 tablets (650 mg total) by mouth every 6 (six) hours. Patient not taking: Reported on 09/01/2024 03/11/24   Elgergawy, Brayton RAMAN, MD  allopurinol  (ZYLOPRIM ) 100 MG tablet Take 1 tablet (100 mg total) by mouth daily. Patient not taking: Reported on 09/01/2024 03/11/24   Elgergawy, Brayton RAMAN, MD  amLODipine  (NORVASC ) 10 MG tablet Take 1 tablet (10 mg total) by mouth at bedtime. 05/24/24   Laurence Locus, DO  Blood Pressure KIT Uncontrolled blood pressure 03/11/24   Elgergawy, Brayton RAMAN, MD  Blood Pressure Monitor MISC Use to check blood pressure 03/11/24   Elgergawy, Brayton RAMAN, MD  carvedilol  (COREG ) 25 MG tablet Take 1 tablet (25 mg total) by mouth 2 (two) times daily with a meal. 05/17/24   [name], NP  cloNIDine  (CATAPRES ) 0.1 MG tablet Take 1 tablet (0.1 mg total) by mouth daily. 03/11/24   Elgergawy, Brayton RAMAN, MD  doxazosin  (CARDURA ) 2 MG tablet Take 1 tablet (2 mg total) by mouth daily. Patient not taking: Reported on 09/01/2024 05/24/24 06/23/24  Laurence Locus, DO  furosemide  (LASIX ) 80 MG tablet Take 1 tablet (80 mg total) by mouth daily. 06/12/24   [name], NP  irbesartan  (AVAPRO ) 300 MG tablet Take 1 tablet (300 mg total) by mouth daily. Patient not taking: Reported on 09/01/2024 05/17/24   Williamson, Joanna R, NP  lidocaine -prilocaine  (EMLA ) cream Apply 1 Application topically daily as needed (for dialysis). 01/14/24   [provider]  magic mouthwash (lidocaine , diphenhydrAMINE , alum & mag hydroxide) suspension Swish and spit 5 mLs 4 (four) times daily as needed for mouth pain. Patient not taking: Reported on 09/01/2024 05/25/24   Laurence Locus, DO  nitroGLYCERIN  (NITROSTAT ) 0.4 MG SL tablet Place 1 tablet (0.4 mg total) under the tongue every 5 (five) minutes as needed for chest pain. Patient not  taking: Reported on 05/26/2024 10/28/23   Rai, Nydia POUR, MD  ondansetron  (ZOFRAN ) 4 MG tablet Take 1 tablet (4 mg total) by mouth every 6 (six) hours. 04/06/24   Lang Norleen POUR, PA-C  oxyCODONE  (ROXICODONE ) 5 MG immediate release tablet Take 1 tablet (5 mg total) by mouth every 4 (four) hours as needed for severe pain (pain score 7-10). 09/06/24   Schuh, McKenzi P, PA-C  sevelamer  carbonate (RENVELA ) 2.4 g PACK Take 2.4 g by mouth daily. Patient not taking: Reported on 09/01/2024 01/21/24   [provider]  tiZANidine  (ZANAFLEX ) 4 MG tablet TAKE 1 TABLET (4 MG TOTAL) BY MOUTH DAILY AS NEEDED FOR MUSCLE SPASM 06/09/24   [name], NP    Social History   Socioeconomic History   Marital status: Single    Spouse name: Not on file   Number of children: 0   Years of education: Not on file   Highest education level: Some college, no degree  Occupational History   Not on file  Tobacco Use   Smoking status: Every Day    Types: E-cigarettes, Cigars   Smokeless tobacco: Never   Tobacco comments:    02/03/2024 Patient smokes 1 cigars daily since 2016  Vaping Use   Vaping status: Some Days  Substance and Sexual Activity   Alcohol use: No    Alcohol/week: 0.0 standard drinks of alcohol   Drug use: No   Sexual activity: Yes    Partners: Male    Birth control/protection: None  Other Topics Concern   Not on file  Social History Narrative   Lives with boyfriend   Social Drivers of Health   Tobacco Use: High Risk (09/08/2024)   Patient History    Smoking Tobacco Use: Every Day    Smokeless Tobacco Use: Never    Passive Exposure: Not on file  Financial Resource Strain: Low Risk (02/03/2024)   Overall Financial Resource Strain (CARDIA)    Difficulty of Paying Living Expenses: Not hard at all  Food Insecurity: Low Risk (06/02/2024)   Received from [facility]   Epic    Within the past 12 months, you worried that your food would run out before you got money to buy more: Never  true    Within the past 12 months, the food you bought just didn't last and you didn't have money to get more. : Never true  Transportation Needs: No Transportation Needs (06/02/2024)   Received from [facility]    In the past 12 months, has lack of reliable transportation kept you from medical appointments, meetings, work or from getting things needed for daily living? : No  Physical Activity: Insufficiently Active (02/03/2024)   Exercise Vital Sign    Days of Exercise per Week: 2 days    Minutes of Exercise per Session: 10 min  Stress: No Stress Concern Present (02/03/2024)   [employer] of Occupational Health - Occupational Stress Questionnaire    Feeling of Stress : Not at all  Social Connections: Socially Isolated (02/03/2024)   Social Connection and Isolation Panel    Frequency of Communication with Friends and Family: Never    Frequency of Social Gatherings with Friends and Family: Once a week    Attends Religious Services: Never    Database Administrator or Organizations: Yes    Attends Banker Meetings: Never    Marital Status: Never married  Intimate Partner Violence: Not At Risk (05/30/2024)   Epic    Fear of Current or Ex-Partner: No    Emotionally Abused: No    Physically Abused: No    Sexually Abused: No  Depression (PHQ2-9): Low Risk (02/03/2024)   Depression (PHQ2-9)    PHQ-2 Score: 0  Recent Concern: Depression (PHQ2-9) - Medium Risk (01/26/2024)   Depression (PHQ2-9)    PHQ-2 Score: 5  Alcohol Screen: Low Risk (02/03/2024)   Alcohol Screen    Last Alcohol Screening Score (AUDIT): 1  Housing: Low Risk (06/02/2024)   Received from [facility]   Epic    What is your living situation today?: I have a steady place to live    Think about the place you live. Do you have problems with any of the following? Choose all that apply:: None/None on this list  Utilities: Low Risk (06/02/2024)   Received from [facility]   Utilities    In the  past 12 months has the electric, gas, oil, or water  company threatened to shut  off services in your home? : No  Health Literacy: Adequate Health Literacy (02/03/2024)   B1300 Health Literacy    Frequency of need for help with medical instructions: Never    Family History  Problem Relation Age of Onset   Lupus Mother    Hypertension Mother    Kidney disease Mother    Heart Problems Mother    Coronary artery disease Mother 35       stent   Diabetes Maternal Grandmother     ROS: Otherwise negative unless mentioned in HPI  Physical Examination  Vitals:   09/08/24 1023 09/08/24 1035  BP:  (!) 161/116  Pulse:  72  Resp:  14  Temp:  97.6 F (36.4 C)  SpO2: 99% 99%   There is no height or weight on file to calculate BMI.  General:  WDWN in NAD Gait: Not observed HENT: WNL, normocephalic Pulmonary: normal non-labored breathing Cardiac: regular Abdomen:  soft, NT/ND, no masses Skin: without rashes Vascular Exam/Pulses: Exam limited due to left arm edema Extremities: without ischemic changes, without Gangrene , without cellulitis; without open wounds;  Musculoskeletal: no muscle wasting or atrophy  Neurologic: A&O X 3;  No focal weakness or paresthesias are detected; speech is fluent/normal Psychiatric:  The pt has Normal affect. Lymph:  Unremarkable  CBC    Component Value Date/Time   WBC 13.2 (H) 09/08/2024 1105   RBC 3.60 (L) 09/08/2024 1105   HGB 11.9 (L) 09/08/2024 1110   HCT 35.0 (L) 09/08/2024 1110   PLT 300 09/08/2024 1105   MCV 93.9 09/08/2024 1105   MCH 28.3 09/08/2024 1105   MCHC 30.2 09/08/2024 1105   RDW 21.5 (H) 09/08/2024 1105   LYMPHSABS 1.4 09/08/2024 1105   MONOABS 0.9 09/08/2024 1105   EOSABS 0.0 09/08/2024 1105   BASOSABS 0.0 09/08/2024 1105    BMET    Component Value Date/Time   NA 132 (L) 09/08/2024 1110   NA 139 06/08/2017 1145   K 6.2 (H) 09/08/2024 1110   CL 106 09/08/2024 1110   CO2 12 (L) 09/08/2024 1105   GLUCOSE 76 09/08/2024  1110   BUN 79 (H) 09/08/2024 1110   BUN 12 06/08/2017 1145   CREATININE 14.60 (H) 09/08/2024 1110   CALCIUM  9.7 09/08/2024 1105   GFRNONAA 4 (L) 09/08/2024 1105   GFRAA 19 (L) 03/27/2019 1510    COAGS: Lab Results  Component Value Date   INR 1.1 04/07/2024   INR 1.2 10/10/2023   INR 1.0 03/27/2019     Non-Invasive Vascular Imaging:   Left upper extremity venous duplex negative for DVT    ASSESSMENT/PLAN: This is a 29 y.o. female with ESRD on HD who presents to the emergency department with left arm edema 2 days status post left arm fistula creation  Ms. Terwilliger is a 29 year old female with ESRD on HD.  2 days ago she underwent first stage left basilic vein fistula creation by Dr. Serene.  She developed left arm edema postoperatively.  Incision in the arm is well-appearing.  Exam of the left arm is limited due to edema however duplex was negative for DVT.  Recommendations include left arm elevation as well as Ace wrap from the hand to the shoulder to help promote venous return.  If left arm edema persists, she can be scheduled for a left arm venogram/fistulogram as an outpatient.  Okay to dialyze via right IJ TDC.  Okay for discharge from vascular surgery standpoint.   Donnice Sender PA-C [facility] [phone]   VASCULAR STAFF ADDENDUM: I have independently interviewed and examined the patient. I agree with the above.  Patient likely with outflow stenosis.  This is a fresh anastomosis, and therefore the patient is not a good candidate for fistulogram.  Would benefit from left arm wrap, elevation, compression.  Will set up in the coming weeks to see in the office.  If swelling gets too severe, would ligate the newly created fistula with the understanding that she has very few options left.  Fonda FORBES Rim MD [facility] Phone Number: [phone] 09/08/2024 2:19 PM      [1]  Allergies Allergen Reactions    Iodine  Hives and Shortness Of Breath   Lisinopril  Anaphylaxis    Angioedema   Gabapentin Other (See Comments)    Reaction: Tremor (intolerance); tremor   Ativan  [Lorazepam ] Other (See Comments)    Somnolent with 1mg  ativan  at [facility] Nov 2019. Patient stated it made her feel like she shut down and was mute and non-responding.   Hydroxychloroquine  Other (See Comments)    Pt reports making her skin peel really bad   Vicodin [Hydrocodone -Acetaminophen ] Itching and Rash   "

## 2024-09-08 NOTE — ED Triage Notes (Signed)
 Pt arrives via EMS from home. Pt reports abdominal pain, nausea, vomiting, and bloating. Reports she has missed her last 3 dialysis sessions.

## 2024-09-08 NOTE — Progress Notes (Signed)
 Asked to see this patient for hospital dialysis. The pt presented c/o abd pain, N/V and bloating. Missed last 3 HD sessions. K+ 6.5, bun 79 and creat 12.7.  WBC 13K, Hb 10.2. Exam shows on RA, CTA bilat, +LUE edema. Recent L BB AVF 1st stage. VVS consulting. Mild bilat LE edema.   The plan will be for ED HD. Pt is not admitted at this time. Pt will go to the dialysis unit when they are ready for the patient. When dialysis is completed pt will be sent back to ED for reassessment.   Vitals:   09/08/24 1023 09/08/24 1035 09/08/24 1424  BP:  (!) 161/116 (!) 175/124  Pulse:  72 73  Resp:  14 16  Temp:  97.6 F (36.4 C) (!) 97.5 F (36.4 C)  TempSrc:  Oral Oral  SpO2: 99% 99% 100%   OP HD: MWF SW 3.5h  B400  68.4kg  2K bath  AVG   Hep 3000 Last OP HD 12/31, post wt 72.9kg Comes off usually 2-5 kg over     I was present at the procedure, reviewed the HD regimen and made appropriate changes.   Myer Fret MD  [facility] 09/08/2024, 3:53 PM    Recent Labs  Lab 09/08/24 1105 09/08/24 1110  HGB 10.2* 11.9*  ALBUMIN  3.1*  --   CALCIUM  9.7  --   CREATININE 12.70* 14.60*  K 6.5* 6.2*   No results for input(s): IRON , TIBC, FERRITIN in the last 168 hours. Inpatient medications:  [START ON 09/09/2024] Chlorhexidine  Gluconate Cloth  6 each Topical Q0600   sodium zirconium cyclosilicate   5 g Oral Daily

## 2024-09-08 NOTE — ED Provider Notes (Signed)
 " [facility] Provider Note   CSN: [id number] Arrival date & time: 09/08/24  1022     Patient presents with: Abdominal Pain, Emesis, and Diarrhea   Linda [name] is a 29 y.o. female.   29 y.o female with a PMH of Migraines, HTN, GERD, Lupus presents to the ED via EMS with a chief complaint of abdominal pain, nausea and left arm pain x yesterday. Patient reports she had a graft placed on her left arm approximately 2 days ago by vascular team.  She is endorsing a lot of pain in the left arm.  Feels very sore, has not had drainage from the wound however her left arm does appear to be swollen.  She does endorse a fever at home however is unsure how high it was that she did not recorded.  She was told by EMS that she might have a low-grade temperature.  She has not taken any medicine for improvement in her symptoms.  Patient also endorses having a lot of diarrhea over the last couple of days, there has not been any blood in her stool present.  No chest pain, no shortness of breath, no other complaints.  The history is provided by the patient.  Abdominal Pain Pain location:  Generalized Pain quality: aching   Pain radiates to:  Does not radiate Pain severity:  Moderate Onset quality:  Gradual Associated symptoms: diarrhea and vomiting   Associated symptoms: no chest pain, no chills, no fever and no shortness of breath   Emesis Associated symptoms: abdominal pain and diarrhea   Associated symptoms: no chills and no fever   Diarrhea Associated symptoms: abdominal pain and vomiting   Associated symptoms: no chills and no fever        Prior to Admission medications  Medication Sig Start Date End Date Taking? Authorizing Provider  acetaminophen  (TYLENOL ) 325 MG tablet Take 2 tablets (650 mg total) by mouth every 6 (six) hours. Patient not taking: Reported on 09/01/2024 03/11/24   Elgergawy, Brayton RAMAN, MD  allopurinol  (ZYLOPRIM ) 100 MG tablet  Take 1 tablet (100 mg total) by mouth daily. Patient not taking: Reported on 09/01/2024 03/11/24   Elgergawy, Brayton RAMAN, MD  amLODipine  (NORVASC ) 10 MG tablet Take 1 tablet (10 mg total) by mouth at bedtime. 05/24/24   Laurence Locus, DO  Blood Pressure KIT Uncontrolled blood pressure 03/11/24   Elgergawy, Brayton RAMAN, MD  Blood Pressure Monitor MISC Use to check blood pressure 03/11/24   Elgergawy, Brayton RAMAN, MD  carvedilol  (COREG ) 25 MG tablet Take 1 tablet (25 mg total) by mouth 2 (two) times daily with a meal. 05/17/24   [name], NP  cloNIDine  (CATAPRES ) 0.1 MG tablet Take 1 tablet (0.1 mg total) by mouth daily. 03/11/24   Elgergawy, Brayton RAMAN, MD  doxazosin  (CARDURA ) 2 MG tablet Take 1 tablet (2 mg total) by mouth daily. Patient not taking: Reported on 09/01/2024 05/24/24 06/23/24  Laurence Locus, DO  furosemide  (LASIX ) 80 MG tablet Take 1 tablet (80 mg total) by mouth daily. 06/12/24   [name], NP  irbesartan  (AVAPRO ) 300 MG tablet Take 1 tablet (300 mg total) by mouth daily. Patient not taking: Reported on 09/01/2024 05/17/24   Williamson, Joanna R, NP  lidocaine -prilocaine  (EMLA ) cream Apply 1 Application topically daily as needed (for dialysis). 01/14/24   [provider]  magic mouthwash (lidocaine , diphenhydrAMINE , alum & mag hydroxide) suspension Swish and spit 5 mLs 4 (four) times daily as  needed for mouth pain. Patient not taking: Reported on 09/01/2024 05/25/24   Laurence Locus, DO  nitroGLYCERIN  (NITROSTAT ) 0.4 MG SL tablet Place 1 tablet (0.4 mg total) under the tongue every 5 (five) minutes as needed for chest pain. Patient not taking: Reported on 05/26/2024 10/28/23   Rai, Nydia POUR, MD  ondansetron  (ZOFRAN ) 4 MG tablet Take 1 tablet (4 mg total) by mouth every 6 (six) hours. 04/06/24   Robinson, John K, PA-C  oxyCODONE  (ROXICODONE ) 5 MG immediate release tablet Take 1 tablet (5 mg total) by mouth every 4 (four) hours as needed for severe pain (pain score 7-10). 09/06/24   Schuh,  McKenzi P, PA-C  sevelamer  carbonate (RENVELA ) 2.4 g PACK Take 2.4 g by mouth daily. Patient not taking: Reported on 09/01/2024 01/21/24   [provider]  tiZANidine  (ZANAFLEX ) 4 MG tablet TAKE 1 TABLET (4 MG TOTAL) BY MOUTH DAILY AS NEEDED FOR MUSCLE SPASM 06/09/24   [name], NP    Allergies: Iodine , Lisinopril , Gabapentin, Ativan  [lorazepam ], Hydroxychloroquine , and Vicodin [hydrocodone -acetaminophen ]    Review of Systems  Constitutional:  Negative for chills and fever.  Respiratory:  Negative for shortness of breath.   Cardiovascular:  Negative for chest pain.  Gastrointestinal:  Positive for abdominal pain, diarrhea and vomiting.  Genitourinary:  Negative for flank pain.  Musculoskeletal:  Negative for back pain.  All other systems reviewed and are negative.   Updated Vital Signs BP (!) 175/124   Pulse 73   Temp (!) 97.5 F (36.4 C) (Oral)   Resp 16   SpO2 100%   Physical Exam Vitals and nursing note reviewed.  HENT:     Head: Normocephalic and atraumatic.  Cardiovascular:     Rate and Rhythm: Normal rate.  Pulmonary:     Breath sounds: Rales present.  Abdominal:     General: Abdomen is flat. Bowel sounds are decreased. There is distension.     Palpations: Abdomen is soft.     Tenderness: There is generalized abdominal tenderness.  Musculoskeletal:     Left wrist: Swelling and tenderness present. No bony tenderness, snuff box tenderness or crepitus. Normal pulse.     Comments: 2+ radial pulses, mild warmth and swelling noted.   Skin:    General: Skin is warm and dry.  Neurological:     Mental Status: She is alert and oriented to person, place, and time.     (all labs ordered are listed, but only abnormal results are displayed) Labs Reviewed  CBC WITH DIFFERENTIAL/PLATELET - Abnormal; Notable for the following components:      Result Value   WBC 13.2 (*)    RBC 3.60 (*)    Hemoglobin 10.2 (*)    HCT 33.8 (*)    RDW 21.5 (*)    Neutro Abs  10.8 (*)    Abs Immature Granulocytes 0.11 (*)    All other components within normal limits  COMPREHENSIVE METABOLIC PANEL WITH GFR - Abnormal; Notable for the following components:   Sodium 131 (*)    Potassium 6.5 (*)    Chloride 97 (*)    CO2 12 (*)    BUN 82 (*)    Creatinine, Ser 12.70 (*)    Albumin  3.1 (*)    Alkaline Phosphatase 221 (*)    GFR, Estimated 4 (*)    Anion gap 23 (*)    All other components within normal limits  LIPASE, BLOOD - Abnormal; Notable for the following components:   Lipase <10 (*)  All other components within normal limits  I-STAT CHEM 8, ED - Abnormal; Notable for the following components:   Sodium 132 (*)    Potassium 6.2 (*)    BUN 79 (*)    Creatinine, Ser 14.60 (*)    Calcium , Ion 1.09 (*)    TCO2 15 (*)    Hemoglobin 11.9 (*)    HCT 35.0 (*)    All other components within normal limits  I-STAT CG4 LACTIC ACID, ED - Abnormal; Notable for the following components:   Lactic Acid, Venous 2.9 (*)    All other components within normal limits  CBG MONITORING, ED - Abnormal; Notable for the following components:   Glucose-Capillary 104 (*)    All other components within normal limits  HCG, SERUM, QUALITATIVE  HEPATITIS B SURFACE ANTIGEN  HEPATITIS B SURFACE ANTIBODY, QUANTITATIVE  I-STAT CG4 LACTIC ACID, ED    EKG: EKG Interpretation Date/Time:  Friday September 08 2024 11:14:29 EST Ventricular Rate:  68 PR Interval:  178 QRS Duration:  104 QT Interval:  452 QTC Calculation: 480 R Axis:   94  Text Interpretation: Normal sinus rhythm Right atrial enlargement Rightward axis Incomplete right bundle branch block Prolonged QT Abnormal ECG When compared with ECG of 29-May-2024 08:29, PREVIOUS ECG IS PRESENT Confirmed by Yolande Charleston [phone]) on 09/08/2024 12:02:23 PM  Radiology: UE Venous Duplex ([facility] and [facility] ONLY) Result Date: 09/08/2024 UPPER VENOUS STUDY  Patient Name:  Linda [name]  Date of Exam:   09/08/2024 Medical Rec #:  [id number]              Accession #:    [id number] Date of Birth: [birth year]              Patient Gender: F Patient Age:   89 years Exam Location:  [facility] Procedure:      VAS US  UPPER EXTREMITY VENOUS DUPLEX Referring Phys: MERL Annabelle Rexroad --------------------------------------------------------------------------------  Indications: Swelling Risk Factors: 09/06/2024 - Left brachiobasilic AVF creation. Limitations: Poor ultrasound/tissue interface, patient pain tolerance and open wound. Comparison Study: No prior studies. Performing Technologist: Cordella Collet RVT  Examination Guidelines: A complete evaluation includes B-mode imaging, spectral Doppler, color Doppler, and power Doppler as needed of all accessible portions of each vessel. Bilateral testing is considered an integral part of a complete examination. Limited examinations for reoccurring indications may be performed as noted.  Right Findings: +----------+------------+---------+-----------+----------+-------+ RIGHT     CompressiblePhasicitySpontaneousPropertiesSummary +----------+------------+---------+-----------+----------+-------+ Subclavian               Yes       Yes                      +----------+------------+---------+-----------+----------+-------+  Left Findings: +----------+------------+---------+-----------+----------+-------+ LEFT      CompressiblePhasicitySpontaneousPropertiesSummary +----------+------------+---------+-----------+----------+-------+ IJV           Full       Yes       Yes                      +----------+------------+---------+-----------+----------+-------+ Subclavian               Yes       Yes                      +----------+------------+---------+-----------+----------+-------+ Axillary      Full       Yes       Yes                      +----------+------------+---------+-----------+----------+-------+  Brachial      Full                                           +----------+------------+---------+-----------+----------+-------+ Radial        Full                                          +----------+------------+---------+-----------+----------+-------+ Ulnar         Full                                          +----------+------------+---------+-----------+----------+-------+ Cephalic      Full                                          +----------+------------+---------+-----------+----------+-------+ Basilic       None                                    AVF   +----------+------------+---------+-----------+----------+-------+  Summary:  Right: No evidence of thrombosis in the subclavian.  Left: No evidence of deep vein thrombosis in the upper extremity. No evidence of superficial vein thrombosis in the upper extremity.  *See table(s) above for measurements and observations.  Diagnosing physician: Fonda Rim Electronically signed by Fonda Rim on 09/08/2024 at 2:41:24 PM.    Final    DG Chest Portable 1 View Result Date: 09/08/2024 CLINICAL DATA:  Shortness of breath.  Recently missed hemodialysis. EXAM: PORTABLE CHEST 1 VIEW COMPARISON:  05/29/2024 FINDINGS: Marked cardiac enlargement remains stable. Right jugular dual-lumen central venous catheter is seen in appropriate position. Mild diffuse interstitial infiltrates are seen, consistent with mild interstitial edema. No No evidence of pulmonary consolidation or pleural effusion. IMPRESSION: Mild diffuse interstitial edema. Stable cardiomegaly. Electronically Signed   By: Norleen DELENA Kil M.D.   On: 09/08/2024 11:25     Procedures   Medications Ordered in the ED  sodium zirconium cyclosilicate  (LOKELMA ) packet 5 g (5 g Oral Given 09/08/24 1251)  fentaNYL  (SUBLIMAZE ) injection 50 mcg (has no administration in time range)  Chlorhexidine  Gluconate Cloth 2 % PADS 6 each (has no administration in time range)  ondansetron  (ZOFRAN ) injection 4 mg (4 mg Intravenous Given 09/08/24 1250)  insulin   aspart (novoLOG ) injection 5 Units (5 Units Intravenous Given 09/08/24 1332)    And  dextrose  50 % solution 50 mL (50 mLs Intravenous Given 09/08/24 1333)  albuterol  (PROVENTIL ) (2.5 MG/3ML) 0.083% nebulizer solution 10 mg (10 mg Nebulization Given 09/08/24 1333)  calcium  gluconate inj 10% (1 g) URGENT USE ONLY! (1 g Intravenous Given 09/08/24 1333)  sodium bicarbonate  injection 50 mEq (50 mEq Intravenous Given 09/08/24 1333)  sodium zirconium cyclosilicate  (LOKELMA ) packet 5 g (5 g Oral Given 09/08/24 1423)    Clinical Course as of 09/08/24 1509  Fri Sep 08, 2024  1256 Lactic Acid, Venous(!!): 2.9 [JS]  1404 Potassium(!!): 6.5 Significantly elevated.  [JS]  1405 Creatinine(!): 12.70 Worsen than her baseline [JS]    Clinical Course User Index [JS] Oluwadamilola Deliz, PA-C  Medical Decision Making Amount and/or Complexity of Data Reviewed Labs:  Decision-making details documented in ED Course.  Risk OTC drugs. Prescription drug management.   This patient presents to the ED for concern of abdominal pain, nausea, this involves a number of treatment options, and is a complaint that carries with it a high risk of complications and morbidity.  The differential diagnosis includes sepsis infection, obstruction versus noncompliance with dialysis.    Co morbidities: Discussed in HPI   Brief History:  SEE HPI.   EMR reviewed including pt PMHx, past surgical history and past visits to ER.   See HPI for more details   Lab Tests:  I ordered and independently interpreted labs.  The pertinent results include:    CBC with slight leukocytosis, hemoglobin is stable. CMP with electrolyte derangements such as hyperkalemia with a potassium of 6.5, suspect likely due to noncompliance with dialysis since the 31st of 2025.  Low sodium, elevated BUN along with elevated creatinine from her baseline.  LFTs are within normal limits.  Her gap is 23.  Imaging Studies:  Chest xray  showed: IMPRESSION:  Mild diffuse interstitial edema.    Stable cardiomegaly.   UE DVT study without any findings.   Cardiac Monitoring:  The patient was maintained on a cardiac monitor.  I personally viewed and interpreted the cardiac monitored which showed an underlying rhythm of: NSR 68 EKG non-ischemic   Medicines ordered:  I ordered medication including lokelma   for hyperkalemia Reevaluation of the patient after these medicines showed that the patient stayed the same I have reviewed the patients home medicines and have made adjustments as needed   Critical Interventions:  Given insulin , dextrose , calcium  gluconate due to hyperkalemia.   Consults:  1:05PM I requested consultation with Dr. Cleatus nephrology,  and discussed lab and imaging findings as well as pertinent plan - they recommend: Dialysis and likely discharge if no other acute findings. 1:28 PM Spoke to vascular surgery who recommended outpatient follow-up.  Pain and swelling are within the normal limits post intervention.   Reevaluation:  After the interventions noted above I re-evaluated patient and found that they have :stayed the same  Social Determinants of Health:  Dialysis noncompliance   Problem List / ED Course:  Patient presented to the ED with a chief complaint of abdominal pain, nausea that is been ongoing for the past couple of days.  Patient tells me she has not been to dialysis since the month of December 2025.  She reports cramping sensation throughout her entire abdomen.  Has not taken any medicine for improvement in symptoms.  In addition, she is having pain to the left arm, she previously had a graft placed by vascular 2 days ago.  She has not been having any drainage from the wound, but does report a subjective fever although afebrile here. On evaluation she does appear uncomfortable, she is not focally tender along the abdomen, there has not been any witnessed episodes of emesis while in  the emergency department.  Left arm does appear to be swollen more proximal, there is some warmth to it, some concern for infection therefore lactic acid was obtained.  I did obtain a upper extremity DVT study to rule out any occlusion, this is negative at this time.  She does have good radial pulses and good strength of her left arm. I did discuss this case with vascular surgery, they do recommend outpatient follow-up and no need for any immediate intervention.  I have a low  suspicion for steal syndrome, no hand pain, no pallor or weakness. Blood work here significant for hyperkalemia with a potassium of 6.5, patient was given Lokelma , insulin , calcium  gluconate, albuterol . NO EKG changes today. Her chest x-ray does show some interstitial edema, some suspicion for needing emergent dialysis, therefore I contacted Dr. Conda of nephrology who stated that patient will be placed on the list for dialysis today.  I do suspect patient will need reassessment prior to discharge from the emergency department after dialysis.  Dispostion:  Patient will need dialysis from the ED and likely disposition home.   Portions of this note were generated with Scientist, clinical (histocompatibility and immunogenetics). Dictation errors may occur despite best attempts at proofreading.    Final diagnoses:  Nausea vomiting and diarrhea  Left arm pain    ED Discharge Orders     None          Maureen Broad, PA-C 09/08/24 1509    Yolande Lamar BROCKS, MD 09/08/24 1811  "

## 2024-09-08 NOTE — ED Notes (Signed)
 Pt to vascular.

## 2024-09-09 LAB — HEPATITIS B SURFACE ANTIBODY, QUANTITATIVE: Hep B S AB Quant (Post): 6 m[IU]/mL — ABNORMAL LOW

## 2024-09-09 MED ORDER — ALUM & MAG HYDROXIDE-SIMETH 200-200-20 MG/5ML PO SUSP
30.0000 mL | Freq: Once | ORAL | Status: AC
  Administered 2024-09-09: 30 mL via ORAL
  Filled 2024-09-09: qty 30

## 2024-09-09 MED ORDER — FAMOTIDINE IN NACL 20-0.9 MG/50ML-% IV SOLN
20.0000 mg | Freq: Once | INTRAVENOUS | Status: AC
  Administered 2024-09-09: 20 mg via INTRAVENOUS
  Filled 2024-09-09: qty 50

## 2024-09-09 MED ORDER — MORPHINE SULFATE (PF) 4 MG/ML IV SOLN
4.0000 mg | Freq: Once | INTRAVENOUS | Status: AC
  Administered 2024-09-09: 4 mg via INTRAVENOUS
  Filled 2024-09-09: qty 1

## 2024-09-09 MED ORDER — OMEPRAZOLE 20 MG PO CPDR: 20.0000 mg | DELAYED_RELEASE_CAPSULE | Freq: Every day | ORAL | 0 refills | Status: AC

## 2024-09-09 MED ORDER — SUCRALFATE 1 G PO TABS: 1.0000 g | ORAL_TABLET | Freq: Three times a day (TID) | ORAL | 0 refills | Status: AC

## 2024-09-09 NOTE — ED Provider Notes (Signed)
 Patient returns to the ER from dialysis.  She states that she still has been having nausea, vomiting and diarrhea.  States that she had a family member that was sick with the same.  Will repeat EKG due to hx of QT prolongation prior to giving antiemetics associated with QT prolongation.   Patient reassessed several times.  She states that she is feeling a bit better.  She has not had any vomiting in the ED since returning from dialysis.  She did have some improvement after Pepcid .  Will discharge her home with Carafate  and omeprazole .  Will withhold antiemetics due to QT prolongation.  Patient discussed with Dr. Midge, who agrees with the plan.  Physical Exam  BP (!) 149/95 (BP Location: Right Arm)   Pulse 90   Temp 98 F (36.7 C) (Oral)   Resp 16   SpO2 100%         Vicky Charleston, PA-C 09/09/24 0559    Midge Golas, MD 09/09/24 [phone]

## 2024-09-13 ENCOUNTER — Inpatient Hospital Stay (HOSPITAL_COMMUNITY): Admission: EM | Admit: 2024-09-13 | Source: Home / Self Care | Attending: Family Medicine | Admitting: Family Medicine

## 2024-09-13 ENCOUNTER — Other Ambulatory Visit: Payer: Self-pay

## 2024-09-13 ENCOUNTER — Encounter (HOSPITAL_COMMUNITY): Payer: Self-pay

## 2024-09-13 ENCOUNTER — Emergency Department (EMERGENCY_DEPARTMENT_HOSPITAL): Admit: 2024-09-13 | Discharge: 2024-09-13 | Disposition: A | Discharge status: Home / Self Care

## 2024-09-13 DIAGNOSIS — I309 Acute pericarditis, unspecified: Secondary | ICD-10-CM

## 2024-09-13 DIAGNOSIS — N186 End stage renal disease: Secondary | ICD-10-CM | POA: Diagnosis present

## 2024-09-13 DIAGNOSIS — I5042 Chronic combined systolic (congestive) and diastolic (congestive) heart failure: Secondary | ICD-10-CM | POA: Diagnosis present

## 2024-09-13 DIAGNOSIS — T82898A Other specified complication of vascular prosthetic devices, implants and grafts, initial encounter: Secondary | ICD-10-CM | POA: Diagnosis present

## 2024-09-13 DIAGNOSIS — M7989 Other specified soft tissue disorders: Principal | ICD-10-CM

## 2024-09-13 DIAGNOSIS — R609 Edema, unspecified: Secondary | ICD-10-CM

## 2024-09-13 DIAGNOSIS — I5023 Acute on chronic systolic (congestive) heart failure: Secondary | ICD-10-CM | POA: Insufficient documentation

## 2024-09-13 DIAGNOSIS — I10 Essential (primary) hypertension: Secondary | ICD-10-CM | POA: Diagnosis present

## 2024-09-13 DIAGNOSIS — K219 Gastro-esophageal reflux disease without esophagitis: Secondary | ICD-10-CM | POA: Diagnosis present

## 2024-09-13 DIAGNOSIS — I5021 Acute systolic (congestive) heart failure: Secondary | ICD-10-CM | POA: Insufficient documentation

## 2024-09-13 DIAGNOSIS — I3139 Other pericardial effusion (noninflammatory): Secondary | ICD-10-CM | POA: Diagnosis present

## 2024-09-13 DIAGNOSIS — R7989 Other specified abnormal findings of blood chemistry: Secondary | ICD-10-CM | POA: Diagnosis present

## 2024-09-13 DIAGNOSIS — I5020 Unspecified systolic (congestive) heart failure: Secondary | ICD-10-CM | POA: Diagnosis present

## 2024-09-13 LAB — I-STAT CHEM 8, ED
BUN: 30 mg/dL — ABNORMAL HIGH (ref 6–20)
Calcium, Ion: 0.81 mmol/L — CL (ref 1.15–1.40)
Chloride: 98 mmol/L (ref 98–111)
Creatinine, Ser: 6.1 mg/dL — ABNORMAL HIGH (ref 0.44–1.00)
Glucose, Bld: 86 mg/dL (ref 70–99)
HCT: 30 % — ABNORMAL LOW (ref 36.0–46.0)
Hemoglobin: 10.2 g/dL — ABNORMAL LOW (ref 12.0–15.0)
Potassium: 5.1 mmol/L (ref 3.5–5.1)
Sodium: 131 mmol/L — ABNORMAL LOW (ref 135–145)
TCO2: 27 mmol/L (ref 22–32)

## 2024-09-13 LAB — COMPREHENSIVE METABOLIC PANEL WITH GFR
ALT: 7 U/L (ref 0–44)
AST: 26 U/L (ref 15–41)
Albumin: 3.3 g/dL (ref 3.5–5.0)
Alkaline Phosphatase: 174 U/L (ref 38–126)
Anion gap: 16 (ref 5–15)
BUN: 22 mg/dL (ref 6–20)
CO2: 25 mmol/L (ref 22–32)
Calcium: 9 mg/dL (ref 8.9–10.3)
Chloride: 93 mmol/L (ref 98–111)
Creatinine, Ser: 5.56 mg/dL (ref 0.44–1.00)
GFR, Estimated: 10 mL/min
Glucose, Bld: 82 mg/dL (ref 70–99)
Potassium: 4.1 mmol/L (ref 3.5–5.1)
Sodium: 134 mmol/L (ref 135–145)
Total Bilirubin: 1.1 mg/dL (ref 0.0–1.2)
Total Protein: 7.9 g/dL (ref 6.5–8.1)

## 2024-09-13 LAB — CBC WITH DIFFERENTIAL/PLATELET
Abs Immature Granulocytes: 0.18 K/uL — ABNORMAL HIGH (ref 0.00–0.07)
Basophils Absolute: 0 K/uL (ref 0.0–0.1)
Basophils Relative: 0 %
Eosinophils Absolute: 0.1 K/uL (ref 0.0–0.5)
Eosinophils Relative: 1 %
HCT: 29.4 % — ABNORMAL LOW (ref 36.0–46.0)
Hemoglobin: 9 g/dL — ABNORMAL LOW (ref 12.0–15.0)
Immature Granulocytes: 2 %
Lymphocytes Relative: 22 %
Lymphs Abs: 2 K/uL (ref 0.7–4.0)
MCH: 28.4 pg (ref 26.0–34.0)
MCHC: 30.6 g/dL (ref 30.0–36.0)
MCV: 92.7 fL (ref 80.0–100.0)
Monocytes Absolute: 1.2 K/uL — ABNORMAL HIGH (ref 0.1–1.0)
Monocytes Relative: 13 %
Neutro Abs: 5.6 K/uL (ref 1.7–7.7)
Neutrophils Relative %: 62 %
Platelets: 292 K/uL (ref 150–400)
RBC: 3.17 MIL/uL — ABNORMAL LOW (ref 3.87–5.11)
RDW: 21.9 % — ABNORMAL HIGH (ref 11.5–15.5)
Smear Review: NORMAL
WBC: 9.2 K/uL (ref 4.0–10.5)
nRBC: 0 % (ref 0.0–0.2)

## 2024-09-13 LAB — I-STAT CG4 LACTIC ACID, ED: Lactic Acid, Venous: 1.9 mmol/L (ref 0.5–1.9)

## 2024-09-13 MED ORDER — CLONIDINE HCL 0.1 MG PO TABS
0.1000 mg | ORAL_TABLET | Freq: Every day | ORAL | Status: DC
  Administered 2024-09-16 – 2024-10-05 (×18): 0.1 mg via ORAL
  Filled 2024-09-13 (×18): qty 1

## 2024-09-13 MED ORDER — FUROSEMIDE 40 MG PO TABS
80.0000 mg | ORAL_TABLET | Freq: Every day | ORAL | Status: AC
  Administered 2024-09-16 – 2024-10-06 (×20): 80 mg via ORAL
  Filled 2024-09-13 (×21): qty 2

## 2024-09-13 MED ORDER — DOXAZOSIN MESYLATE 2 MG PO TABS
2.0000 mg | ORAL_TABLET | Freq: Every day | ORAL | Status: DC
  Filled 2024-09-13 (×2): qty 1

## 2024-09-13 MED ORDER — PANTOPRAZOLE SODIUM 40 MG PO TBEC
40.0000 mg | DELAYED_RELEASE_TABLET | Freq: Every day | ORAL | Status: AC
  Administered 2024-09-16 – 2024-10-06 (×20): 40 mg via ORAL
  Filled 2024-09-13 (×21): qty 1

## 2024-09-13 MED ORDER — SUCRALFATE 1 G PO TABS
1.0000 g | ORAL_TABLET | Freq: Three times a day (TID) | ORAL | Status: DC
  Administered 2024-09-15 – 2024-09-22 (×26): 1 g via ORAL
  Filled 2024-09-13 (×24): qty 1

## 2024-09-13 MED ORDER — HYDRALAZINE HCL 20 MG/ML IJ SOLN
5.0000 mg | Freq: Four times a day (QID) | INTRAMUSCULAR | Status: AC | PRN
  Filled 2024-09-13: qty 1

## 2024-09-13 MED ORDER — MORPHINE SULFATE (PF) 4 MG/ML IV SOLN
4.0000 mg | Freq: Once | INTRAVENOUS | Status: AC
  Administered 2024-09-13: 4 mg via INTRAVENOUS
  Filled 2024-09-13: qty 1

## 2024-09-13 MED ORDER — FENTANYL CITRATE (PF) 50 MCG/ML IJ SOSY
12.5000 ug | PREFILLED_SYRINGE | INTRAMUSCULAR | Status: DC | PRN
  Administered 2024-09-13 – 2024-09-14 (×4): 12.5 ug via INTRAVENOUS
  Filled 2024-09-13 (×4): qty 1

## 2024-09-13 MED ORDER — OXYCODONE HCL 5 MG PO TABS
5.0000 mg | ORAL_TABLET | Freq: Three times a day (TID) | ORAL | Status: DC | PRN
  Administered 2024-09-14: 5 mg via ORAL
  Filled 2024-09-13: qty 1

## 2024-09-13 MED ORDER — NALOXONE HCL 0.4 MG/ML IJ SOLN: 0.4000 mg | INTRAMUSCULAR | Status: AC | PRN

## 2024-09-13 MED ORDER — CARVEDILOL 25 MG PO TABS
25.0000 mg | ORAL_TABLET | Freq: Two times a day (BID) | ORAL | Status: DC
  Administered 2024-09-13: 25 mg via ORAL
  Filled 2024-09-13: qty 1
  Filled 2024-09-13: qty 2

## 2024-09-13 MED ORDER — AMLODIPINE BESYLATE 10 MG PO TABS
10.0000 mg | ORAL_TABLET | Freq: Every day | ORAL | Status: DC
  Administered 2024-09-13 – 2024-10-04 (×22): 10 mg via ORAL
  Filled 2024-09-13 (×18): qty 1
  Filled 2024-09-13: qty 2
  Filled 2024-09-13 (×3): qty 1

## 2024-09-13 MED ORDER — IRBESARTAN 300 MG PO TABS
300.0000 mg | ORAL_TABLET | Freq: Every day | ORAL | Status: DC
  Administered 2024-09-16 – 2024-10-05 (×19): 300 mg via ORAL
  Filled 2024-09-13 (×20): qty 1

## 2024-09-13 MED ORDER — FENTANYL CITRATE (PF) 50 MCG/ML IJ SOSY
50.0000 ug | PREFILLED_SYRINGE | Freq: Once | INTRAMUSCULAR | Status: DC
  Filled 2024-09-13: qty 1

## 2024-09-13 NOTE — ED Triage Notes (Signed)
 Pt. Stated, I had surgery to put in a graft or fistula, My arm is probably infected. Its swollen, and leaking. I also have knots in my thigh.  Pt's left ar,hand is swollen

## 2024-09-13 NOTE — H&P (Addendum)
 " History and Physical    Linda [name] FMW:[id number] DOB: 10/15/1995 DOA: 09/13/2024  PCP: [name], NP  Patient coming from: Home  Chief Complaint: Left arm swelling and pain  HPI: Linda [name] is a 29 y.o. female with medical history significant of hypertension, G6PD deficiency, CHF, lupus, ESRD on HD MWF, history of multiple AV fistula revisions presenting with complaints of progressively worsening left arm swelling and pain since after recent AV fistula surgery a week ago.  She noticed wound dehiscence about 3 days ago.  Denies fevers, chills, chest pain, shortness of breath, nausea, vomiting, or abdominal pain.  Currently receiving dialysis via right IJ TDC and received full treatment today.     ED Course: Blood pressure 145/108 on arrival but remainder of vital signs stable.  CBC showing no leukocytosis, hemoglobin 9.0 (close to baseline).  I-STAT chemistry showing sodium 131, potassium 5.1, bicarb 27, ionized calcium  0.81.  CMP pending.  Lactic acid normal.  Blood cultures in process.  Left upper extremity venous duplex ultrasound negative for DVT.  Vascular surgery saw the patient and planning on taking her to the OR tomorrow for ligation of her fistula.  Recommended keeping n.p.o. after midnight.  Review of Systems:  Review of Systems  All other systems reviewed and are negative.   Past Medical History:  Diagnosis Date   Abnormal ECG    a. In setting of LVH w/ repolarization abnormalities; b. 03/2019 MV: No ischemia/infarct. EF 55-65%.   Abscess of buttock, right 01/11/2024   Acute hypoxic respiratory failure (HCC) 10/10/2023   Acute on chronic heart failure with mildly reduced ejection fraction (HFmrEF, 41-49%) (HCC) 10/10/2023   Acute pulmonary edema (HCC) 09/30/2021   Anaphylactic shock, unspecified, initial encounter 10/02/2021   Angioedema    Aspiration pneumonia (HCC) 03/07/2024   Encounter for screening for respiratory tuberculosis 10/02/2021    ESRD on hemodialysis (HCC)    G6PD deficiency    GERD (gastroesophageal reflux disease)    High anion gap metabolic acidosis 09/02/2021   HUS (hemolytic uremic syndrome), atypical (HCC) 05/04/2018   Hypertension    Hypertensive urgency 11/29/2017   Infection due to Strongyloides 05/28/2021   Lupus    LVH (left ventricular hypertrophy)    a. 11/2017 Echo: EF 50-55%. triv AI. Mild MR. Mod L and mild R atrial enlargement. Nl RV size/fxn. Small pericardial effusion w/o tampondae; b. 03/2018 Echo: EF 55-60%, restrictive filling pattern. Nl RV size/fxn. Sev LAE. Mild MR, mild to mod TR/PR. Mod PAH. Triv effusion.   Migraines    Mixed connective tissue disease    Positive D dimer 12/22/2023   Previous Dialysis patient [facility])    a. Off HD since late 2020.   Resistant hypertension    Septic prepatellar bursitis of right knee 08/19/2017   Severe sepsis (HCC) 01/06/2018    Past Surgical History:  Procedure Laterality Date   A/V FISTULAGRAM N/A 03/10/2024   Procedure: A/V Fistulagram;  Surgeon: Serene Gaile ORN, MD;  Location: [facility];  Service: Cardiovascular;  Laterality: N/A;   AV FISTULA PLACEMENT Left 09/06/2024   Procedure: BRACHIOBASILIC ARTERIOVENOUS (AV) FISTULA CREATION;  Surgeon: Serene Gaile ORN, MD;  Location: [facility] OR;  Service: Vascular;  Laterality: Left;   GRAFT APPLICATION Left 03/30/2019   Procedure: BRACHIAL CEPHALIC GRAFT CREATION;  Surgeon: Marea Selinda RAMAN, MD;  Location: [facility] ORS;  Service: Vascular;  Laterality: Left;   I & D EXTREMITY Right 08/06/2017   Procedure: IRRIGATION AND DEBRIDEMENT KNEE;  Surgeon:  Haddix, Franky SQUIBB, MD;  Location: [facility] OR;  Service: Orthopedics;  Laterality: Right;   IR AV DIALY SHUNT INTRO NEEDLE/INTRACATH INITIAL W/PTA/IMG LEFT  05/23/2024   IR REMOVAL TUN CV CATH W/O FL  05/24/2024   IR THROMBECTOMY AV FISTULA W/THROMBOLYSIS/PTA INC/SHUNT/IMG LEFT Left 05/30/2024   IR THROMBECTOMY AV FISTULA W/THROMBOLYSIS/PTA/STENT INC/SHUNT/IMG LT Left 05/18/2022    IR TUNNELED CENTRAL VENOUS CATH PLC W IMG  05/19/2024   IR US  GUIDE VASC ACCESS LEFT  05/18/2022   IR US  GUIDE VASC ACCESS LEFT  05/23/2024   IR US  GUIDE VASC ACCESS LEFT  05/30/2024   MULTIPLE TOOTH EXTRACTIONS     RADIOLOGY WITH ANESTHESIA N/A 05/23/2024   Procedure: RADIOLOGY WITH ANESTHESIA;  Surgeon: Jennefer Ester PARAS, MD;  Location: [facility] OR;  Service: Radiology;  Laterality: N/A;   RADIOLOGY WITH ANESTHESIA N/A 05/30/2024   Procedure: RADIOLOGY WITH ANESTHESIA;  Surgeon: Philip Cornet, MD;  Location: [facility] OR;  Service: Radiology;  Laterality: N/A;   REMOVAL OF GRAFT Left 09/06/2024   Procedure: RESECTION, GRAFT;  Surgeon: Serene Gaile ORN, MD;  Location: [facility] OR;  Service: Vascular;  Laterality: Left;     reports that she has been smoking e-cigarettes and cigars. She has never used smokeless tobacco. She reports that she does not drink alcohol and does not use drugs.  Allergies[1]  Family History  Problem Relation Age of Onset   Lupus Mother    Hypertension Mother    Kidney disease Mother    Heart Problems Mother    Coronary artery disease Mother 37       stent   Diabetes Maternal Grandmother     Prior to Admission medications  Medication Sig Start Date End Date Taking? Authorizing Provider  acetaminophen  (TYLENOL ) 325 MG tablet Take 2 tablets (650 mg total) by mouth every 6 (six) hours. Patient not taking: Reported on 09/01/2024 03/11/24   Elgergawy, Brayton RAMAN, MD  allopurinol  (ZYLOPRIM ) 100 MG tablet Take 1 tablet (100 mg total) by mouth daily. Patient not taking: Reported on 09/01/2024 03/11/24   Elgergawy, Brayton RAMAN, MD  amLODipine  (NORVASC ) 10 MG tablet Take 1 tablet (10 mg total) by mouth at bedtime. 05/24/24   Laurence Locus, DO  Blood Pressure KIT Uncontrolled blood pressure 03/11/24   Elgergawy, Brayton RAMAN, MD  Blood Pressure Monitor MISC Use to check blood pressure 03/11/24   Elgergawy, Brayton RAMAN, MD  carvedilol  (COREG ) 25 MG tablet Take 1 tablet (25 mg total) by mouth 2 (two) times daily with a  meal. 05/17/24   [name], NP  cloNIDine  (CATAPRES ) 0.1 MG tablet Take 1 tablet (0.1 mg total) by mouth daily. 03/11/24   Elgergawy, Brayton RAMAN, MD  doxazosin  (CARDURA ) 2 MG tablet Take 1 tablet (2 mg total) by mouth daily. Patient not taking: Reported on 09/01/2024 05/24/24 06/23/24  Laurence Locus, DO  furosemide  (LASIX ) 80 MG tablet Take 1 tablet (80 mg total) by mouth daily. 06/12/24   [name], NP  irbesartan  (AVAPRO ) 300 MG tablet Take 1 tablet (300 mg total) by mouth daily. Patient not taking: Reported on 09/01/2024 05/17/24   Williamson, Joanna R, NP  lidocaine -prilocaine  (EMLA ) cream Apply 1 Application topically daily as needed (for dialysis). 01/14/24   [provider]  magic mouthwash (lidocaine , diphenhydrAMINE , alum & mag hydroxide) suspension Swish and spit 5 mLs 4 (four) times daily as needed for mouth pain. Patient not taking: Reported on 09/01/2024 05/25/24   Laurence Locus, DO  nitroGLYCERIN  (NITROSTAT ) 0.4 MG SL tablet Place  1 tablet (0.4 mg total) under the tongue every 5 (five) minutes as needed for chest pain. Patient not taking: Reported on 05/26/2024 10/28/23   Rai, Nydia POUR, MD  omeprazole  (PRILOSEC) 20 MG capsule Take 1 capsule (20 mg total) by mouth daily. 09/09/24   Vicky Charleston, PA-C  ondansetron  (ZOFRAN ) 4 MG tablet Take 1 tablet (4 mg total) by mouth every 6 (six) hours. 04/06/24   Lang Norleen POUR, PA-C  oxyCODONE  (ROXICODONE ) 5 MG immediate release tablet Take 1 tablet (5 mg total) by mouth every 4 (four) hours as needed for severe pain (pain score 7-10). 09/06/24   Schuh, McKenzi P, PA-C  sevelamer  carbonate (RENVELA ) 2.4 g PACK Take 2.4 g by mouth daily. Patient not taking: Reported on 09/01/2024 01/21/24   [provider]  sucralfate  (CARAFATE ) 1 g tablet Take 1 tablet (1 g total) by mouth 4 (four) times daily -  with meals and at bedtime. 09/09/24   Vicky Charleston, PA-C  tiZANidine  (ZANAFLEX ) 4 MG tablet TAKE 1 TABLET (4 MG TOTAL) BY MOUTH  DAILY AS NEEDED FOR MUSCLE SPASM 06/09/24   [name], NP    Physical Exam: Vitals:   09/13/24 1331 09/13/24 1906  BP: (!) 145/108 (!) 148/108  Pulse: 93 87  Resp: 18 18  Temp: 97.6 F (36.4 C) 98.2 F (36.8 C)  TempSrc:  Oral  SpO2: 100% 91%    Physical Exam Vitals reviewed.  Constitutional:      General: She is not in acute distress. HENT:     Head: Normocephalic and atraumatic.  Eyes:     Extraocular Movements: Extraocular movements intact.  Cardiovascular:     Rate and Rhythm: Normal rate and regular rhythm.     Pulses: Normal pulses.  Pulmonary:     Effort: Pulmonary effort is normal. No respiratory distress.     Breath sounds: Normal breath sounds. No rales.  Abdominal:     General: Bowel sounds are normal.     Palpations: Abdomen is soft.     Tenderness: There is no abdominal tenderness. There is no guarding.  Musculoskeletal:     Cervical back: Normal range of motion.     Right lower leg: No edema.     Left lower leg: No edema.     Comments: Left upper extremity: Edematous but neurovascularly intact.  Signs of wound dehiscence at recent AV fistula surgical site.  Skin:    General: Skin is warm and dry.  Neurological:     General: No focal deficit present.     Mental Status: She is alert and oriented to person, place, and time.      Labs on Admission: I have personally reviewed following labs and imaging studies  CBC: Recent Labs  Lab 09/08/24 1105 09/08/24 1110 09/13/24 1414 09/13/24 1454  WBC 13.2*  --   --  9.2  NEUTROABS 10.8*  --   --  5.6  HGB 10.2* 11.9* 10.2* 9.0*  HCT 33.8* 35.0* 30.0* 29.4*  MCV 93.9  --   --  92.7  PLT 300  --   --  292   Basic Metabolic Panel: Recent Labs  Lab 09/08/24 1105 09/08/24 1110 09/13/24 1414  NA 131* 132* 131*  K 6.5* 6.2* 5.1  CL 97* 106 98  CO2 12*  --   --   GLUCOSE 76 76 86  BUN 82* 79* 30*  CREATININE 12.70* 14.60* 6.10*  CALCIUM  9.7  --   --    GFR: Estimated  Creatinine  Clearance: 12.7 mL/min (A) (by C-G formula based on SCr of 6.1 mg/dL (H)). Liver Function Tests: Recent Labs  Lab 09/08/24 1105  AST 17  ALT 5  ALKPHOS 221*  BILITOT 0.9  PROT 8.0  ALBUMIN  3.1*   Recent Labs  Lab 09/08/24 1105  LIPASE <10*   No results for input(s): AMMONIA in the last 168 hours. Coagulation Profile: No results for input(s): INR, PROTIME in the last 168 hours. Cardiac Enzymes: No results for input(s): CKTOTAL, CKMB, CKMBINDEX, TROPONINI in the last 168 hours. BNP (last 3 results) No results for input(s): PROBNP in the last 8760 hours. HbA1C: No results for input(s): HGBA1C in the last 72 hours. CBG: Recent Labs  Lab 09/08/24 1351  GLUCAP 104*   Lipid Profile: No results for input(s): CHOL, HDL, LDLCALC, TRIG, CHOLHDL, LDLDIRECT in the last 72 hours. Thyroid Function Tests: No results for input(s): TSH, T4TOTAL, FREET4, T3FREE, THYROIDAB in the last 72 hours. Anemia Panel: No results for input(s): VITAMINB12, FOLATE, FERRITIN, TIBC, IRON , RETICCTPCT in the last 72 hours. Urine analysis:    Component Value Date/Time   COLORURINE YELLOW 04/06/2024 0529   APPEARANCEUR HAZY (A) 04/06/2024 0529   LABSPEC 1.004 (L) 04/06/2024 0529   PHURINE 9.0 (H) 04/06/2024 0529   GLUCOSEU NEGATIVE 04/06/2024 0529   HGBUR NEGATIVE 04/06/2024 0529   BILIRUBINUR NEGATIVE 04/06/2024 0529   BILIRUBINUR Negative 02/10/2018 1602   KETONESUR NEGATIVE 04/06/2024 0529   PROTEINUR 100 (A) 04/06/2024 0529   UROBILINOGEN 0.2 02/10/2018 1602   UROBILINOGEN 1.0 03/30/2015 2328   NITRITE NEGATIVE 04/06/2024 0529   LEUKOCYTESUR NEGATIVE 04/06/2024 0529    Radiological Exams on Admission: UE Venous Duplex ([facility] and [facility] ONLY) Result Date: 09/13/2024 UPPER VENOUS STUDY  Patient Name:  FAITHE ARIOLA Burstein  Date of Exam:   09/13/2024 Medical Rec #: [id number]              Accession #:    [id number] Date of Birth: [birth year]               Patient Gender: F Patient Age:   24 years Exam Location:  [facility] Procedure:      VAS US  UPPER EXTREMITY VENOUS DUPLEX Referring Phys: ALEXIS YOUNG --------------------------------------------------------------------------------  Indications: Left arm edema, cellulitis vs DVT. Performing Technologist: Edilia Elden Appl  Examination Guidelines: A complete evaluation includes B-mode imaging, spectral Doppler, color Doppler, and power Doppler as needed of all accessible portions of each vessel. Bilateral testing is considered an integral part of a complete examination. Limited examinations for reoccurring indications may be performed as noted.  Right Findings: +----------+------------+---------+-----------+----------+-------+ RIGHT     CompressiblePhasicitySpontaneousPropertiesSummary +----------+------------+---------+-----------+----------+-------+ IJV           Full       Yes       Yes                      +----------+------------+---------+-----------+----------+-------+ Subclavian               Yes       Yes                      +----------+------------+---------+-----------+----------+-------+  Left Findings: +----------+------------+---------+-----------+----------+-------+ LEFT      CompressiblePhasicitySpontaneousPropertiesSummary +----------+------------+---------+-----------+----------+-------+ IJV           Full       Yes       Yes                      +----------+------------+---------+-----------+----------+-------+  Subclavian    Full       Yes       Yes                      +----------+------------+---------+-----------+----------+-------+ Axillary      Full       Yes       Yes                      +----------+------------+---------+-----------+----------+-------+ Brachial      Full       Yes       Yes              Limited +----------+------------+---------+-----------+----------+-------+ Radial        Full                                           +----------+------------+---------+-----------+----------+-------+ Ulnar         Full                                          +----------+------------+---------+-----------+----------+-------+ Cephalic      Full       Yes       Yes                      +----------+------------+---------+-----------+----------+-------+ Basilic                                               AVF   +----------+------------+---------+-----------+----------+-------+ Limited exam due to patient's pain and excessive swelling. Patent fistula. Occluded graft.  Summary:  Right: No evidence of thrombosis in the subclavian.  Left: No evidence of deep vein thrombosis in the upper extremity. No evidence of superficial vein thrombosis in the upper extremity.  *See table(s) above for measurements and observations.  Diagnosing physician: Lonni Gaskins MD Electronically signed by Lonni Gaskins MD on 09/13/2024 at 8:13:03 PM.    Final     Assessment and Plan  Left upper extremity edema and pain with ongoing drainage from incision after recent AV fistula surgery No signs of infection. Vascular surgery is planning on taking her to the OR tomorrow for ligation of her AV fistula.  Keep n.p.o. after midnight and continue pain management.  ESRD on HD MWF Patient has a right IJ [facility] and received full outpatient dialysis treatment today.  Consult nephrology in the morning.  Chronic HFrEF Last echo done in July 2025 showing EF 30 to 35%, moderate biatrial dilation, mild mitral regurgitation, and moderate tricuspid regurgitation.  No signs of volume overload at this time.  Volume management with dialysis as above.  Hypertension SBP in the 140s.  Continue home meds including amlodipine , carvedilol , clonidine , doxazosin , furosemide , and irbesartan .  IV hydralazine  PRN SBP >160.  GERD Continue PPI and sucralfate .  Anemia of chronic disease Hemoglobin close to baseline, monitor labs.  Home  medications initiated per patient's reported history.  Awaiting pharmacy med rec for verification.   DVT prophylaxis: SCDs Code Status: Full Code (discussed with the patient) Level of care: Telemetry bed Admission status: It is my clinical opinion that referral for OBSERVATION is reasonable and  necessary in this patient based on the above information provided. The aforementioned taken together are felt to place the patient at high risk for further clinical deterioration. However, it is anticipated that the patient may be medically stable for discharge from the hospital within 24 to 48 hours.  Editha Ram MD [facility]  If 7PM-7AM, please contact night-coverage [email or web address]  09/13/2024, 9:18 PM       [1]  Allergies Allergen Reactions   Iodine  Hives and Shortness Of Breath   Lisinopril  Anaphylaxis    Angioedema   Gabapentin Other (See Comments)    Reaction: Tremor (intolerance); tremor   Ativan  [Lorazepam ] Other (See Comments)    Somnolent with 1mg  ativan  at [facility] Nov 2019. Patient stated it made her feel like she shut down and was mute and non-responding.   Hydroxychloroquine  Other (See Comments)    Pt reports making her skin peel really bad   Vicodin [Hydrocodone -Acetaminophen ] Itching and Rash   "

## 2024-09-13 NOTE — Progress Notes (Signed)
 Left upper extremity venous duplex has been completed. Results can be found in chart review under CV Proc. and relayed to PA 09/13/2024 4:51 PM  Zeina Akkerman [facility], RVT.

## 2024-09-13 NOTE — Anesthesia Preprocedure Evaluation (Addendum)
 "                                  Anesthesia Evaluation  Patient identified by MRN, date of birth, ID band  Reviewed: Allergy & Precautions, H&P , NPO status , Patient's Chart, lab work & pertinent test results, Unable to perform ROS - Chart review only  History of Anesthesia Complications Negative for: history of anesthetic complications  Airway Mallampati: III  TM Distance: >3 FB Neck ROM: Full    Dental  (+) Dental Advisory Given, Teeth Intact,    Pulmonary neg sleep apnea, neg recent URI, Current Smoker and Patient abstained from smoking.   breath sounds clear to auscultation       Cardiovascular hypertension, Pt. on medications + angina  +CHF   Rhythm:Regular  TTE 02/2024: 1. Left ventricular ejection fraction, by estimation, is 30 to 35%. The  left ventricle has moderately decreased function. The left ventricle  demonstrates global hypokinesis. The left ventricular internal cavity size  was moderately dilated. There is mild   left ventricular hypertrophy. Left ventricular diastolic parameters are  indeterminate.   2. Right ventricular systolic function is normal. The right ventricular  size is normal.   3. Left atrial size was moderately dilated.   4. Right atrial size was moderately dilated.   5. The mitral valve is abnormal. Mild mitral valve regurgitation. No  evidence of mitral stenosis.   6. Tricuspid valve regurgitation is moderate.   7. The aortic valve is normal in structure. Aortic valve regurgitation is  not visualized. No aortic stenosis is present.   8. The inferior vena cava is normal in size with greater than 50%  respiratory variability, suggesting right atrial pressure of 3 mmHg.     Neuro/Psych  Headaches, neg Seizures PSYCHIATRIC DISORDERS Anxiety        GI/Hepatic Neg liver ROS,GERD  Medicated,,  Endo/Other  negative endocrine ROSType 2    Renal/GU ESRF and DialysisRenal diseaseLab Results      Component                Value                Date                      NA                       131 (L)             09/15/2024                K                        3.8                 09/15/2024                CO2                      27                  09/14/2024                GLUCOSE                  101 (H)  09/15/2024                BUN                      24 (H)              09/15/2024                CREATININE               5.30 (H)            09/15/2024                CALCIUM                   8.2 (L)             09/14/2024                GFRNONAA                 8 (L)               09/14/2024            Last HD 1/15     Musculoskeletal negative musculoskeletal ROS (+)    Abdominal   Peds  Hematology  (+) Blood dyscrasia, anemia Lab Results      Component                Value               Date                      WBC                      7.8                 09/14/2024                HGB                      10.5 (L)            09/15/2024                HCT                      31.0 (L)            09/15/2024                MCV                      94.6                09/14/2024                PLT                      289                 09/14/2024              Anesthesia Other Findings SLE, Complex Pain Syndrome  Reproductive/Obstetrics                              Anesthesia Physical Anesthesia Plan  ASA:  4  Anesthesia Plan: MAC   Post-op Pain Management: Tylenol  PO (pre-op )*   Induction: Intravenous  PONV Risk Score and Plan: 2 and Ondansetron , Midazolam  and Treatment may vary due to age or medical condition  Airway Management Planned: Nasal Cannula, Natural Airway and Simple Face Mask  Additional Equipment: None  Intra-op Plan:   Post-operative Plan:   Informed Consent: I have reviewed the patients History and Physical, chart, labs and discussed the procedure including the risks, benefits and alternatives for the proposed anesthesia with the patient  or authorized representative who has indicated his/her understanding and acceptance.     Dental advisory given  Plan Discussed with: CRNA  Anesthesia Plan Comments: (Pt presented to ED complaining of chest pain in addition to the LUE pain. Troponin sent, which was elevated (118) discussed with primary service that this was concerning and requested cardiology eval based on her extensive cardiac history. Repeat trop sent only 2 hrs after 1st and was 112. Discussed with Dr. Pearline. This is limb ischemia and not emergent. Waiting for this prior to proceeding. Dr. Pearline states he can do this under mac/local.  Risks of anesthesia explained at length. This includes, but is not limited to, sore throat, damage to teeth, lips gums, tongue and vocal cords, nausea and vomiting, reactions to medications, stroke, heart attack, and death. All patient questions were answered and the patient wishes to proceed.    PAT note by Lynwood Hope, PA-C: 29 year old female with pertinent history including HTN, G6PD deficiency, HFrEF, lupus nephritis, ESRD on HD Monday Wednesday Friday.  She has a left upper arm graft that clotted in October 2025.  She has a catheter in place.  Follows with cardiology for history of HFrEF felt secondary to severe hypertensive cardiomyopathy.  Most recent echo 03/06/2024 showed LVEF 30 to 35%, global hypokinesis, normal RV, mild mitral regurgitation, moderate tricuspid regurgitation.  Prior nuclear stress 03/07/2019 was low risk.  Last seen by Dr. Ozell Fell 03/08/2024 during admission for acute hypoxic respiratory failure secondary to volume overload with pulmonary edema in setting of missed HD sessions.  Dr. Fell recommended continue volume management with hemodialysis. Management of HTN deferred to nephrology. No other input from cardiology standpoint.  She will need day of surgery labs and evaluation.  EKG 05/29/2024: NSR.  Rate 73.  Biatrial enlargement.  Incomplete right  bundle-branch block.  Minimal voltage criteria for LVH, may be normal variant.  TTE 03/06/2024: 1. Left ventricular ejection fraction, by estimation, is 30 to 35%. The  left ventricle has moderately decreased function. The left ventricle  demonstrates global hypokinesis. The left ventricular internal cavity size  was moderately dilated. There is mild  left ventricular hypertrophy. Left ventricular diastolic parameters are  indeterminate.  2. Right ventricular systolic function is normal. The right ventricular  size is normal.  3. Left atrial size was moderately dilated.  4. Right atrial size was moderately dilated.  5. The mitral valve is abnormal. Mild mitral valve regurgitation. No  evidence of mitral stenosis.  6. Tricuspid valve regurgitation is moderate.  7. The aortic valve is normal in structure. Aortic valve regurgitation is  not visualized. No aortic stenosis is present.  8. The inferior vena cava is normal in size with greater than 50%  respiratory variability, suggesting right atrial pressure of 3 mmHg.    Nuclear stress 03/07/2019:  Low risk, probably normal pharmacologic myocardial perfusion stress test.  There is no significant ischemia or scar.  Calculated left ventricular systolic function is mildly to  moderately reduced but is likely affected by significant extracardiac activity (48% by Siemens calculation, 38% by QGS). LVEF appears normal by visual estimation.  There is no significant coronary artery calcification noted on the attenuation correction CT.  Central venous catheter is noted in the SVC.  )         Anesthesia Quick Evaluation  "

## 2024-09-13 NOTE — Consult Note (Signed)
 " Hospital Consult    Reason for Consult: Left arm edema with drainage from incision after recent AV fistula Referring Physician: ED MRN #:  [id number]  History of Present Illness: This is a 29 y.o. female with ESRD using a right IJ TDC that   vascular surgery has been consulted for significant left arm edema with ongoing drainage from her incision after recent AV fistula surgery.  She had a left arm AV fistula with a brachiobasilic on 09/06/2024 with Dr. Serene.  States she has had significant left arm swelling since surgery.  She was seen in the ED by Dr. Lanis about 5 days ago and he recommended conservative care with elevation and arm wrapping.  Now having clear drainage from the incision and states her arm is very painful.  Past Medical History:  Diagnosis Date   Abnormal ECG    a. In setting of LVH w/ repolarization abnormalities; b. 03/2019 MV: No ischemia/infarct. EF 55-65%.   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 11/2017 Echo: EF 50-55%. triv AI. Mild MR. Mod L and mild R atrial enlargement. Nl RV size/fxn. Small pericardial effusion w/o tampondae; b. 03/2018 Echo: EF 55-60%, restrictive filling pattern. Nl RV size/fxn. Sev LAE. Mild MR, mild to mod TR/PR. Mod PAH.  Triv effusion.   Migraines    Mixed connective tissue disease    Positive D dimer 12/22/2023   Previous D[facility])    a. Off HD since late 2020.   Resistant hypertension    Septic prepatellar bursitis of right knee 08/19/2017   Severe sepsis (HCC) 01/06/2018    Past Surgical History:  Procedure Laterality Date   A/V FISTULAGRAM N/A 03/10/2024   Procedure: A/V Fistulagram;  Surgeon: Serene Gaile ORN, MD;  Location: [facility];  Service: Cardiovascular;  Laterality: N/A;   AV FISTULA PLACEMENT Left 09/06/2024   Procedure: BRACHIOBASILIC ARTERIOVENOUS (AV) FISTULA CREATION;  Surgeon: Serene Gaile ORN, MD;  Location: [facility] OR;  Service: Vascular;  Laterality: Left;   GRAFT APPLICATION Left 03/30/2019   Procedure: BRACHIAL CEPHALIC GRAFT CREATION;  Surgeon: Marea Selinda RAMAN, MD;  Location: [facility] ORS;  Service: Vascular;  Laterality: Left;   I & D EXTREMITY Right 08/06/2017   Procedure: IRRIGATION AND DEBRIDEMENT KNEE;  Surgeon: Kendal Franky SQUIBB, MD;  Location: [facility] OR;  Service: Orthopedics;  Laterality: Right;   IR AV DIALY SHUNT INTRO NEEDLE/INTRACATH INITIAL W/PTA/IMG LEFT  05/23/2024   IR REMOVAL TUN CV CATH W/O FL  05/24/2024   IR THROMBECTOMY AV FISTULA W/THROMBOLYSIS/PTA INC/SHUNT/IMG LEFT Left 05/30/2024   IR THROMBECTOMY AV FISTULA W/THROMBOLYSIS/PTA/STENT INC/SHUNT/IMG LT Left 05/18/2022   IR TUNNELED CENTRAL VENOUS CATH PLC W IMG  05/19/2024   IR US  GUIDE VASC ACCESS LEFT  05/18/2022   IR US  GUIDE VASC ACCESS LEFT  05/23/2024   IR US  GUIDE VASC ACCESS LEFT  05/30/2024   MULTIPLE TOOTH EXTRACTIONS     RADIOLOGY WITH ANESTHESIA N/A 05/23/2024   Procedure: RADIOLOGY WITH ANESTHESIA;  Surgeon: Jennefer Ester PARAS, MD;  Location: [facility] OR;  Service: Radiology;  Laterality: N/A;   RADIOLOGY WITH ANESTHESIA N/A 05/30/2024   Procedure: RADIOLOGY WITH ANESTHESIA;  Surgeon: Philip Cornet, MD;  Location: [facility] OR;  Service: Radiology;  Laterality: N/A;   REMOVAL OF GRAFT Left 09/06/2024   Procedure:  RESECTION, GRAFT;  Surgeon: Serene Gaile ORN, MD;  Location: [facility] OR;  Service: Vascular;  Laterality: Left;    Allergies[1]  Prior to Admission medications  Medication Sig Start Date End Date Taking? Authorizing Provider  acetaminophen  (TYLENOL ) 325 MG tablet Take 2 tablets (650 mg total) by mouth every 6 (six) hours. Patient not taking: Reported on 09/01/2024 03/11/24   Elgergawy, Brayton RAMAN, MD  allopurinol  (ZYLOPRIM ) 100 MG tablet Take 1 tablet (100 mg total) by mouth daily. Patient not taking: Reported on 09/01/2024 03/11/24   Elgergawy, Brayton RAMAN, MD  amLODipine  (NORVASC ) 10 MG tablet Take 1 tablet (10 mg total) by mouth at bedtime. 05/24/24   Laurence Locus, DO  Blood Pressure KIT Uncontrolled blood pressure 03/11/24   Elgergawy, Brayton RAMAN, MD  Blood Pressure Monitor MISC Use to check blood pressure 03/11/24   Elgergawy, Brayton RAMAN, MD  carvedilol  (COREG ) 25 MG tablet Take 1 tablet (25 mg total) by mouth 2 (two) times daily with a meal. 05/17/24   [name], NP  cloNIDine  (CATAPRES ) 0.1 MG tablet Take 1 tablet (0.1 mg total) by mouth daily. 03/11/24   Elgergawy, Brayton RAMAN, MD  doxazosin  (CARDURA ) 2 MG tablet Take 1 tablet (2 mg total) by mouth daily. Patient not taking: Reported on 09/01/2024 05/24/24 06/23/24  Laurence Locus, DO  furosemide  (LASIX ) 80 MG tablet Take 1 tablet (80 mg total) by mouth daily. 06/12/24   [name], NP  irbesartan  (AVAPRO ) 300 MG tablet Take 1 tablet (300 mg total) by mouth daily. Patient not taking: Reported on 09/01/2024 05/17/24   Williamson, Joanna R, NP  lidocaine -prilocaine  (EMLA ) cream Apply 1 Application topically daily as needed (for dialysis). 01/14/24   [provider]  magic mouthwash (lidocaine , diphenhydrAMINE , alum & mag hydroxide) suspension Swish and spit 5 mLs 4 (four) times daily as needed for mouth pain. Patient not taking: Reported on 09/01/2024 05/25/24   Laurence Locus, DO  nitroGLYCERIN  (NITROSTAT ) 0.4 MG SL tablet Place 1 tablet (0.4 mg total)  under the tongue every 5 (five) minutes as needed for chest pain. Patient not taking: Reported on 05/26/2024 10/28/23   Rai, Nydia POUR, MD  omeprazole  (PRILOSEC) 20 MG capsule Take 1 capsule (20 mg total) by mouth daily. 09/09/24   Vicky Charleston, PA-C  ondansetron  (ZOFRAN ) 4 MG tablet Take 1 tablet (4 mg total) by mouth every 6 (six) hours. 04/06/24   Robinson, John K, PA-C  oxyCODONE  (ROXICODONE ) 5 MG immediate release tablet Take 1 tablet (5 mg total) by mouth every 4 (four) hours as needed for severe pain (pain score 7-10). 09/06/24   Schuh, McKenzi P, PA-C  sevelamer  carbonate (RENVELA ) 2.4 g PACK Take 2.4 g by mouth daily. Patient not taking: Reported on 09/01/2024 01/21/24   [provider]  sucralfate  (CARAFATE ) 1 g tablet Take 1 tablet (1 g total) by mouth 4 (four) times daily -  with meals and at bedtime. 09/09/24   Vicky Charleston, PA-C  tiZANidine  (ZANAFLEX ) 4 MG tablet TAKE  1 TABLET (4 MG TOTAL) BY MOUTH DAILY AS NEEDED FOR MUSCLE SPASM 06/09/24   [name], NP    Social History   Socioeconomic History   Marital status: Single    Spouse name: Not on file   Number of children: 0   Years of education: Not on file   Highest education level: Some college, no degree  Occupational History   Not on file  Tobacco Use   Smoking status: Every Day    Types: E-cigarettes, Cigars   Smokeless tobacco: Never   Tobacco comments:    02/03/2024 Patient smokes 1 cigars daily since 2016  Vaping Use   Vaping status: Some Days  Substance and Sexual Activity   Alcohol use: No    Alcohol/week: 0.0 standard drinks of alcohol   Drug use: No   Sexual activity: Yes    Partners: Male    Birth control/protection: None  Other Topics Concern   Not on file  Social History Narrative   Lives with boyfriend   Social Drivers of Health   Tobacco Use: High Risk (09/13/2024)   Patient History    Smoking Tobacco Use: Every Day    Smokeless Tobacco Use: Never    Passive Exposure: Not on  file  Financial Resource Strain: Low Risk (02/03/2024)   Overall Financial Resource Strain (CARDIA)    Difficulty of Paying Living Expenses: Not hard at all  Food Insecurity: Low Risk (06/02/2024)   Received from [facility]   Epic    Within the past 12 months, you worried that your food would run out before you got money to buy more: Never true    Within the past 12 months, the food you bought just didn't last and you didn't have money to get more. : Never true  Transportation Needs: No Transportation Needs (06/02/2024)   Received from [facility]    In the past 12 months, has lack of reliable transportation kept you from medical appointments, meetings, work or from getting things needed for daily living? : No  Physical Activity: Insufficiently Active (02/03/2024)   Exercise Vital Sign    Days of Exercise per Week: 2 days    Minutes of Exercise per Session: 10 min  Stress: No Stress Concern Present (02/03/2024)   [employer] of Occupational Health - Occupational Stress Questionnaire    Feeling of Stress : Not at all  Social Connections: Socially Isolated (02/03/2024)   Social Connection and Isolation Panel    Frequency of Communication with Friends and Family: Never    Frequency of Social Gatherings with Friends and Family: Once a week    Attends Religious Services: Never    Database Administrator or Organizations: Yes    Attends Banker Meetings: Never    Marital Status: Never married  Intimate Partner Violence: Not At Risk (05/30/2024)   Epic    Fear of Current or Ex-Partner: No    Emotionally Abused: No    Physically Abused: No    Sexually Abused: No  Depression (PHQ2-9): Low Risk (02/03/2024)   Depression (PHQ2-9)    PHQ-2 Score: 0  Recent Concern: Depression (PHQ2-9) - Medium Risk (01/26/2024)   Depression (PHQ2-9)    PHQ-2 Score: 5  Alcohol Screen: Low Risk (02/03/2024)   Alcohol Screen    Last Alcohol Screening Score (AUDIT): 1  Housing: Low  Risk (06/02/2024)   Received from [facility]   Epic    What is your living situation today?:  I have a steady place to live    Think about the place you live. Do you have problems with any of the following? Choose all that apply:: None/None on this list  Utilities: Low Risk (06/02/2024)   Received from [facility]   Utilities    In the past 12 months has the electric, gas, oil, or water  company threatened to shut off services in your home? : No  Health Literacy: Adequate Health Literacy (02/03/2024)   B1300 Health Literacy    Frequency of need for help with medical instructions: Never     Family History  Problem Relation Age of Onset   Lupus Mother    Hypertension Mother    Kidney disease Mother    Heart Problems Mother    Coronary artery disease Mother 14       stent   Diabetes Maternal Grandmother     ROS: [x]  Positive   [ ]  Negative   [ ]  All sytems reviewed and are negative  Cardiovascular: []  chest pain/pressure []  palpitations []  SOB lying flat []  DOE []  pain in legs while walking []  pain in legs at rest []  pain in legs at night []  non-healing ulcers []  hx of DVT []  swelling in legs  Pulmonary: []  productive cough []  asthma/wheezing []  home O2  Neurologic: []  weakness in []  arms []  legs []  numbness in []  arms []  legs []  hx of CVA []  mini stroke [] difficulty speaking or slurred speech []  temporary loss of vision in one eye []  dizziness  Hematologic: []  hx of cancer []  bleeding problems []  problems with blood clotting easily  Endocrine:   []  diabetes []  thyroid disease  GI []  vomiting blood []  blood in stool  GU: []  CKD/renal failure []  HD--[]  M/W/F or []  T/T/S []  burning with urination []  blood in urine  Psychiatric: []  anxiety []  depression  Musculoskeletal: []  arthritis []  joint pain  Integumentary: []  rashes []  ulcers  Constitutional: []  fever []  chills   Physical Examination  Vitals:   09/13/24 1331 09/13/24 1906  BP:  (!) 145/108 (!) 148/108  Pulse: 93 87  Resp: 18 18  Temp: 97.6 F (36.4 C) 98.2 F (36.8 C)  SpO2: 100% 91%   There is no height or weight on file to calculate BMI.  General:  NAD Gait: Not observed HENT: WNL, normocephalic Pulmonary: normal non-labored breathing Cardiac: regular, without  Murmurs, rubs or gallops Vascular Exam/Pulses: Appreciable thrill in left arm fistula Dehiscence of left arm incision with serous drainage Left hand is warm and motor/sensory intact although difficult to appreciate any distal pulses Musculoskeletal: no muscle wasting or atrophy  Neurologic: A&O X 3; Appropriate Affect ; SENSATION: normal; MOTOR FUNCTION:  moving all extremities equally. Speech is fluent/normal    CBC    Component Value Date/Time   WBC 9.2 09/13/2024 1454   RBC 3.17 (L) 09/13/2024 1454   HGB 9.0 (L) 09/13/2024 1454   HCT 29.4 (L) 09/13/2024 1454   PLT 292 09/13/2024 1454   MCV 92.7 09/13/2024 1454   MCH 28.4 09/13/2024 1454   MCHC 30.6 09/13/2024 1454   RDW 21.9 (H) 09/13/2024 1454   LYMPHSABS 2.0 09/13/2024 1454   MONOABS 1.2 (H) 09/13/2024 1454   EOSABS 0.1 09/13/2024 1454   BASOSABS 0.0 09/13/2024 1454    BMET    Component Value Date/Time   NA 131 (L) 09/13/2024 1414   NA 139 06/08/2017 1145   K 5.1 09/13/2024 1414   CL 98 09/13/2024 1414  CO2 12 (L) 09/08/2024 1105   GLUCOSE 86 09/13/2024 1414   BUN 30 (H) 09/13/2024 1414   BUN 12 06/08/2017 1145   CREATININE 6.10 (H) 09/13/2024 1414   CALCIUM  9.7 09/08/2024 1105   GFRNONAA 4 (L) 09/08/2024 1105   GFRAA 19 (L) 03/27/2019 1510    COAGS: Lab Results  Component Value Date   INR 1.1 04/07/2024   INR 1.2 10/10/2023   INR 1.0 03/27/2019     Non-Invasive Vascular Imaging:     Left arm venous duplex shows no DVT with patent fistula  ASSESSMENT/PLAN: This is a 29 y.o. female with ESRD using a right IJ TDC that vascular surgery has been consulted for significant left arm edema with ongoing drainage  from her incision after recent AV fistula surgery.  She had a left arm AV fistula with a brachiobasilic on 09/06/2024 with Dr. Serene.  States she has had significant left arm swelling since surgery.  She was seen in the ED by Dr. Lanis about 5 days ago and he recommended conservative care with elevation and arm wrapping.  Unfortunately her swelling has progressed and now she has some dehiscence of her incision.  I discussed two options with the patient at bedside.  Suspect she has a central venous stenosis versus occlusion and I discussed trying to salvage the fistula with a left arm fistulogram in the Cath Lab tomorrow with intervention including angioplasty and or stent.  Ultimately she does not feel she can tolerate her level of edema and swelling and is very uncomfortable.  I discussed the other option of proceeding to the OR for ligation of her fistula which she would like to pursue.  She does have a right IJ TDC.  Would appreciate medicine for admission given all of her comorbidities.  Please keep n.p.o. after midnight.  OR tomorrow.  Lonni DOROTHA Gaskins, MD [facility] Office: [phone]  Lonni JINNY Gaskins     [1]  Allergies Allergen Reactions   Iodine  Hives and Shortness Of Breath   Lisinopril  Anaphylaxis    Angioedema   Gabapentin Other (See Comments)    Reaction: Tremor (intolerance); tremor   Ativan  [Lorazepam ] Other (See Comments)    Somnolent with 1mg  ativan  at [facility] Nov 2019. Patient stated it made her feel like she shut down and was mute and non-responding.   Hydroxychloroquine  Other (See Comments)    Pt reports making her skin peel really bad   Vicodin [Hydrocodone -Acetaminophen ] Itching and Rash   "

## 2024-09-13 NOTE — ED Provider Notes (Signed)
 " [name] EMERG[facility]Y DEPARTMENT AT [facility] Provider Note   CSN: [id number] Arrival date & time: 09/13/24  1325     Patient presents with: Post-op Problem, Arm Pain, Emesis, Nausea, and knots in my thighs   Linda [name] is a 29 y.o. female.   Patient is here with left arm swelling after having fistula placed in her left arm for dialysis about a week ago.  Has been swelling pretty significantly the last week.  Had dialysis through a port in her chest today completed the full session was sent here for evaluation.  She is having leakage of fluid through the surgical site.  She denies any fever or chills.  She has a history of lupus hypertension  The history is provided by the patient.       Prior to Admission medications  Medication Sig Start Date End Date Taking? Authorizing Provider  acetaminophen  (TYLENOL ) 325 MG tablet Take 2 tablets (650 mg total) by mouth every 6 (six) hours. Patient not taking: Reported on 09/01/2024 03/11/24   Elgergawy, Brayton RAMAN, MD  allopurinol  (ZYLOPRIM ) 100 MG tablet Take 1 tablet (100 mg total) by mouth daily. Patient not taking: Reported on 09/01/2024 03/11/24   Elgergawy, Brayton RAMAN, MD  amLODipine  (NORVASC ) 10 MG tablet Take 1 tablet (10 mg total) by mouth at bedtime. 05/24/24   Laurence Locus, DO  Blood Pressure KIT Uncontrolled blood pressure 03/11/24   Elgergawy, Brayton RAMAN, MD  Blood Pressure Monitor MISC Use to check blood pressure 03/11/24   Elgergawy, Brayton RAMAN, MD  carvedilol  (COREG ) 25 MG tablet Take 1 tablet (25 mg total) by mouth 2 (two) times daily with a meal. 05/17/24   [name], NP  cloNIDine  (CATAPRES ) 0.1 MG tablet Take 1 tablet (0.1 mg total) by mouth daily. 03/11/24   Elgergawy, Brayton RAMAN, MD  doxazosin  (CARDURA ) 2 MG tablet Take 1 tablet (2 mg total) by mouth daily. Patient not taking: Reported on 09/01/2024 05/24/24 06/23/24  Laurence Locus, DO  furosemide  (LASIX ) 80 MG tablet Take 1 tablet (80 mg total) by mouth daily.  06/12/24   [name], NP  irbesartan  (AVAPRO ) 300 MG tablet Take 1 tablet (300 mg total) by mouth daily. Patient not taking: Reported on 09/01/2024 05/17/24   Williamson, Joanna R, NP  lidocaine -prilocaine  (EMLA ) cream Apply 1 Application topically daily as needed (for dialysis). 01/14/24   [provider]  magic mouthwash (lidocaine , diphenhydrAMINE , alum & mag hydroxide) suspension Swish and spit 5 mLs 4 (four) times daily as needed for mouth pain. Patient not taking: Reported on 09/01/2024 05/25/24   Laurence Locus, DO  nitroGLYCERIN  (NITROSTAT ) 0.4 MG SL tablet Place 1 tablet (0.4 mg total) under the tongue every 5 (five) minutes as needed for chest pain. Patient not taking: Reported on 05/26/2024 10/28/23   Rai, Nydia POUR, MD  omeprazole  (PRILOSEC) 20 MG capsule Take 1 capsule (20 mg total) by mouth daily. 09/09/24   Vicky Charleston, PA-C  ondansetron  (ZOFRAN ) 4 MG tablet Take 1 tablet (4 mg total) by mouth every 6 (six) hours. 04/06/24   Lang Norleen POUR, PA-C  oxyCODONE  (ROXICODONE ) 5 MG immediate release tablet Take 1 tablet (5 mg total) by mouth every 4 (four) hours as needed for severe pain (pain score 7-10). 09/06/24   Schuh, McKenzi P, PA-C  sevelamer  carbonate (RENVELA ) 2.4 g PACK Take 2.4 g by mouth daily. Patient not taking: Reported on 09/01/2024 01/21/24   [provider]  sucralfate  (CARAFATE ) 1 g tablet  Take 1 tablet (1 g total) by mouth 4 (four) times daily -  with meals and at bedtime. 09/09/24   Vicky Charleston, PA-C  tiZANidine  (ZANAFLEX ) 4 MG tablet TAKE 1 TABLET (4 MG TOTAL) BY MOUTH DAILY AS NEEDED FOR MUSCLE SPASM 06/09/24   [name], NP    Allergies: Iodine , Lisinopril , Gabapentin, Ativan  [lorazepam ], Hydroxychloroquine , and Vicodin [hydrocodone -acetaminophen ]    Review of Systems  Updated Vital Signs BP (!) 148/108 (BP Location: Right Arm)   Pulse 87   Temp 98.2 F (36.8 C) (Oral)   Resp 18   SpO2 91%   Physical Exam Vitals and nursing  note reviewed.  Constitutional:      General: She is not in acute distress.    Appearance: She is well-developed. She is not ill-appearing.  HENT:     Head: Normocephalic and atraumatic.     Nose: Nose normal.     Mouth/Throat:     Mouth: Mucous membranes are moist.  Eyes:     Extraocular Movements: Extraocular movements intact.     Conjunctiva/sclera: Conjunctivae normal.     Pupils: Pupils are equal, round, and reactive to light.  Cardiovascular:     Rate and Rhythm: Normal rate and regular rhythm.     Pulses: Normal pulses.     Heart sounds: Normal heart sounds. No murmur heard. Pulmonary:     Effort: Pulmonary effort is normal. No respiratory distress.     Breath sounds: Normal breath sounds.  Abdominal:     General: Abdomen is flat.     Palpations: Abdomen is soft.     Tenderness: There is no abdominal tenderness.  Musculoskeletal:        General: Swelling and tenderness present.     Cervical back: Normal range of motion and neck supple.     Comments: Significant edema to the left upper extremity there are some clear drainage from surgical site in the left bicep area, I can feel a thrill in this area  Skin:    General: Skin is warm and dry.     Capillary Refill: Capillary refill takes less than 2 seconds.  Neurological:     General: No focal deficit present.     Mental Status: She is alert and oriented to person, place, and time.     Cranial Nerves: No cranial nerve deficit.     Sensory: No sensory deficit.     Motor: No weakness.     Coordination: Coordination normal.  Psychiatric:        Mood and Affect: Mood normal.     (all labs ordered are listed, but only abnormal results are displayed) Labs Reviewed  CBC WITH DIFFERENTIAL/PLATELET - Abnormal; Notable for the following components:      Result Value   RBC 3.17 (*)    Hemoglobin 9.0 (*)    HCT 29.4 (*)    RDW 21.9 (*)    Monocytes Absolute 1.2 (*)    Abs Immature Granulocytes 0.18 (*)    All other  components within normal limits  I-STAT CHEM 8, ED - Abnormal; Notable for the following components:   Sodium 131 (*)    BUN 30 (*)    Creatinine, Ser 6.10 (*)    Calcium , Ion 0.81 (*)    Hemoglobin 10.2 (*)    HCT 30.0 (*)    All other components within normal limits  CULTURE, BLOOD (ROUTINE X 2)  CULTURE, BLOOD (ROUTINE X 2)  COMPREHENSIVE METABOLIC PANEL WITH GFR  I-STAT  CG4 LACTIC ACID, ED    EKG: None  Radiology: UE Venous Duplex ([facility] and [facility] ONLY) Result Date: 09/13/2024 UPPER VENOUS STUDY  Patient Name:  Linda [name]  Date of Exam:   09/13/2024 Medical Rec #: [id number]              Accession #:    [id number] Date of Birth: [birth year]              Patient Gender: F Patient Age:   60 years Exam Location:  [facility] Procedure:      VAS US  UPPER EXTREMITY VENOUS DUPLEX Referring Phys: ALEXIS YOUNG --------------------------------------------------------------------------------  Indications: Left arm edema, cellulitis vs DVT. Performing Technologist: Edilia Elden Appl  Examination Guidelines: A complete evaluation includes B-mode imaging, spectral Doppler, color Doppler, and power Doppler as needed of all accessible portions of each vessel. Bilateral testing is considered an integral part of a complete examination. Limited examinations for reoccurring indications may be performed as noted.  Right Findings: +----------+------------+---------+-----------+----------+-------+ RIGHT     CompressiblePhasicitySpontaneousPropertiesSummary +----------+------------+---------+-----------+----------+-------+ IJV           Full       Yes       Yes                      +----------+------------+---------+-----------+----------+-------+ Subclavian               Yes       Yes                      +----------+------------+---------+-----------+----------+-------+  Left Findings: +----------+------------+---------+-----------+----------+-------+ LEFT       CompressiblePhasicitySpontaneousPropertiesSummary +----------+------------+---------+-----------+----------+-------+ IJV           Full       Yes       Yes                      +----------+------------+---------+-----------+----------+-------+ Subclavian    Full       Yes       Yes                      +----------+------------+---------+-----------+----------+-------+ Axillary      Full       Yes       Yes                      +----------+------------+---------+-----------+----------+-------+ Brachial      Full       Yes       Yes              Limited +----------+------------+---------+-----------+----------+-------+ Radial        Full                                          +----------+------------+---------+-----------+----------+-------+ Ulnar         Full                                          +----------+------------+---------+-----------+----------+-------+ Cephalic      Full       Yes       Yes                      +----------+------------+---------+-----------+----------+-------+  Basilic                                               AVF   +----------+------------+---------+-----------+----------+-------+ Limited exam due to patient's pain and excessive swelling. Patent fistula. Occluded graft.  Summary:  Right: No evidence of thrombosis in the subclavian.  Left: No evidence of deep vein thrombosis in the upper extremity. No evidence of superficial vein thrombosis in the upper extremity.  *See table(s) above for measurements and observations.  Diagnosing physician: Lonni Gaskins MD Electronically signed by Lonni Gaskins MD on 09/13/2024 at 8:13:03 PM.    Final      Procedures   Medications Ordered in the ED  morphine  (PF) 4 MG/ML injection 4 mg (4 mg Intravenous Given 09/13/24 1955)                                    Medical Decision Making Risk Prescription drug management. Decision regarding hospitalization.   Simuel Lipoma Boerema is here with left arm swelling after AV fistula placed last week.  Arm is swollen throughout with more pitting edema then really cellulitic changes.  She has got wound dehiscence at her surgical site with some clear drainage from this area.  She has no fever.  She has a port that she is getting dialysis through.  She was at dialysis today and they recommended that she come for evaluation.  She has not seen her vascular surgeon for this and follow-up.  Her lab work is unremarkable here.  No white count.  WBCs 9.2.  Hemoglobin is unremarkable.  Lactic acid is normal.  She has got capillary refill in her fingers but cannot really Doppler any ulnar or radial pulses because of edema I can feel a thrill in the left upper arm where fistula was placed.  She had a DVT study that showed no DVT.  Ultimately will have Dr. Gaskins with vascular come and evaluate her.  Overall Dr. Gaskins with vascular surgery evaluated the patient.  Does not think it is infectious but did offer her ligation of the fistula to help with the swelling.  He also offered her a fistulogram but she did not want this.  Ultimately this will be done tomorrow.  To be admitted to medicine.  N.p.o. at midnight.  Stable throughout my care.  This chart was dictated using voice recognition software.  Despite best efforts to proofread,  errors can occur which can change the documentation meaning.      Final diagnoses:  Left arm swelling    ED Discharge Orders     None          Ruthe Cornet, DO 09/13/24 2129  "

## 2024-09-13 NOTE — ED Provider Triage Note (Signed)
 Emergency Medicine Provider Triage Evaluation Note  Linda [name] , a 29 y.o. female  was evaluated in triage.  Pt complains of increased left arm swelling and pain since having a fistula placed on 09/06/2024.  Notes drainage from the area and foul odor.  Fistula has not been able to use.  She is on dialysis Monday, Wednesday, Friday.  Had dialysis this morning.  Denies fevers.  Review of Systems  Positive: Arm edema, drainage Negative: Fevers  Physical Exam  BP (!) 145/108   Pulse 93   Temp 97.6 F (36.4 C)   Resp 18   SpO2 100%  Gen:   Awake, no distress   Resp:  Normal effort  MSK:   Moves extremities without difficulty  Other:  Significant left arm edema and warmth.  Malodorous area around the fistula  Medical Decision Making  Medically screening exam initiated at 1:46 PM.  Appropriate orders placed.  Linda [name] was informed that the remainder of the evaluation will be completed by another provider, this initial triage assessment does not replace that evaluation, and the importance of remaining in the ED until their evaluation is complete.     Neysa Thersia RAMAN, PA-C 09/13/24 1347

## 2024-09-14 ENCOUNTER — Encounter (HOSPITAL_COMMUNITY): Payer: Self-pay | Admitting: Internal Medicine

## 2024-09-14 ENCOUNTER — Inpatient Hospital Stay (HOSPITAL_COMMUNITY)

## 2024-09-14 DIAGNOSIS — R7989 Other specified abnormal findings of blood chemistry: Secondary | ICD-10-CM

## 2024-09-14 DIAGNOSIS — I10 Essential (primary) hypertension: Secondary | ICD-10-CM | POA: Diagnosis not present

## 2024-09-14 DIAGNOSIS — I5023 Acute on chronic systolic (congestive) heart failure: Secondary | ICD-10-CM | POA: Insufficient documentation

## 2024-09-14 DIAGNOSIS — N186 End stage renal disease: Secondary | ICD-10-CM | POA: Diagnosis not present

## 2024-09-14 DIAGNOSIS — T82898D Other specified complication of vascular prosthetic devices, implants and grafts, subsequent encounter: Secondary | ICD-10-CM

## 2024-09-14 DIAGNOSIS — K219 Gastro-esophageal reflux disease without esophagitis: Secondary | ICD-10-CM | POA: Diagnosis not present

## 2024-09-14 DIAGNOSIS — R079 Chest pain, unspecified: Secondary | ICD-10-CM | POA: Diagnosis not present

## 2024-09-14 DIAGNOSIS — I5021 Acute systolic (congestive) heart failure: Secondary | ICD-10-CM | POA: Diagnosis not present

## 2024-09-14 DIAGNOSIS — M7989 Other specified soft tissue disorders: Secondary | ICD-10-CM

## 2024-09-14 DIAGNOSIS — I420 Dilated cardiomyopathy: Secondary | ICD-10-CM | POA: Diagnosis not present

## 2024-09-14 LAB — TROPONIN T, HIGH SENSITIVITY
Troponin T High Sensitivity: 118 ng/L (ref 0–19)
Troponin T High Sensitivity: 112 ng/L (ref 0–19)

## 2024-09-14 LAB — BASIC METABOLIC PANEL WITH GFR
Anion gap: 13 (ref 5–15)
BUN: 27 mg/dL — ABNORMAL HIGH (ref 6–20)
CO2: 27 mmol/L (ref 22–32)
Calcium: 8.2 mg/dL — ABNORMAL LOW (ref 8.9–10.3)
Chloride: 95 mmol/L — ABNORMAL LOW (ref 98–111)
Creatinine, Ser: 6.35 mg/dL — ABNORMAL HIGH (ref 0.44–1.00)
GFR, Estimated: 8 mL/min — ABNORMAL LOW
Glucose, Bld: 100 mg/dL — ABNORMAL HIGH (ref 70–99)
Potassium: 3.9 mmol/L (ref 3.5–5.1)
Sodium: 134 mmol/L — ABNORMAL LOW (ref 135–145)

## 2024-09-14 LAB — HEPATITIS B SURFACE ANTIGEN: Hepatitis B Surface Ag: NONREACTIVE

## 2024-09-14 LAB — CBC
HCT: 28 % — ABNORMAL LOW (ref 36.0–46.0)
Hemoglobin: 8.3 g/dL — ABNORMAL LOW (ref 12.0–15.0)
MCH: 28 pg (ref 26.0–34.0)
MCHC: 29.6 g/dL — ABNORMAL LOW (ref 30.0–36.0)
MCV: 94.6 fL (ref 80.0–100.0)
Platelets: 289 K/uL (ref 150–400)
RBC: 2.96 MIL/uL — ABNORMAL LOW (ref 3.87–5.11)
RDW: 22.3 % — ABNORMAL HIGH (ref 11.5–15.5)
WBC: 7.8 K/uL (ref 4.0–10.5)
nRBC: 0.4 % — ABNORMAL HIGH (ref 0.0–0.2)

## 2024-09-14 MED ORDER — NITROGLYCERIN 0.4 MG SL SUBL
0.4000 mg | SUBLINGUAL_TABLET | SUBLINGUAL | Status: AC | PRN
  Administered 2024-09-14 – 2024-10-02 (×20): 0.4 mg via SUBLINGUAL
  Filled 2024-09-14 (×16): qty 1

## 2024-09-14 MED ORDER — HEPARIN SODIUM (PORCINE) 1000 UNIT/ML DIALYSIS: 2000.0000 [IU] | Freq: Once | INTRAMUSCULAR | Status: DC

## 2024-09-14 MED ORDER — HEPARIN SODIUM (PORCINE) 1000 UNIT/ML IJ SOLN
INTRAMUSCULAR | Status: AC
  Filled 2024-09-14: qty 4

## 2024-09-14 MED ORDER — PENTAFLUOROPROP-TETRAFLUOROETH EX AERO: 1.0000 | INHALATION_SPRAY | CUTANEOUS | Status: DC | PRN

## 2024-09-14 MED ORDER — FENTANYL CITRATE (PF) 100 MCG/2ML IJ SOLN
INTRAMUSCULAR | Status: AC
  Filled 2024-09-14: qty 2

## 2024-09-14 MED ORDER — CHLORHEXIDINE GLUCONATE CLOTH 2 % EX PADS
6.0000 | MEDICATED_PAD | Freq: Every day | CUTANEOUS | Status: AC
  Administered 2024-09-16 – 2024-09-17 (×2): 6 via TOPICAL

## 2024-09-14 MED ORDER — HYDROMORPHONE HCL 1 MG/ML IJ SOLN
INTRAMUSCULAR | Status: AC
  Filled 2024-09-14: qty 0.5

## 2024-09-14 MED ORDER — HEPARIN SODIUM (PORCINE) 1000 UNIT/ML DIALYSIS: 1000.0000 [IU] | INTRAMUSCULAR | Status: DC | PRN

## 2024-09-14 MED ORDER — LIDOCAINE HCL (PF) 1 % IJ SOLN: 5.0000 mL | INTRAMUSCULAR | Status: DC | PRN

## 2024-09-14 MED ORDER — LIDOCAINE-PRILOCAINE 2.5-2.5 % EX CREA: 1.0000 | TOPICAL_CREAM | CUTANEOUS | Status: DC | PRN

## 2024-09-14 MED ORDER — ALTEPLASE 2 MG IJ SOLR: 2.0000 mg | Freq: Once | INTRAMUSCULAR | Status: DC | PRN

## 2024-09-14 MED ORDER — MUPIROCIN 2 % EX OINT
1.0000 | TOPICAL_OINTMENT | Freq: Two times a day (BID) | CUTANEOUS | Status: AC
  Administered 2024-09-15 – 2024-09-18 (×8): 1 via NASAL
  Filled 2024-09-14 (×4): qty 22

## 2024-09-14 MED ORDER — SODIUM CHLORIDE 0.9 % IV SOLN: INTRAVENOUS | Status: DC

## 2024-09-14 MED ORDER — MORPHINE SULFATE (PF) 2 MG/ML IV SOLN
INTRAVENOUS | Status: AC
  Filled 2024-09-14: qty 1

## 2024-09-14 MED ORDER — ORAL CARE MOUTH RINSE: 15.0000 mL | Freq: Once | OROMUCOSAL | Status: DC

## 2024-09-14 MED ORDER — ACETAMINOPHEN 500 MG PO TABS: 1000.0000 mg | ORAL_TABLET | Freq: Once | ORAL | Status: DC

## 2024-09-14 MED ORDER — HEPARIN SODIUM (PORCINE) 1000 UNIT/ML IJ SOLN
INTRAMUSCULAR | Status: AC
  Filled 2024-09-14: qty 2

## 2024-09-14 MED ORDER — MORPHINE SULFATE (PF) 2 MG/ML IV SOLN
1.0000 mg | INTRAVENOUS | Status: DC | PRN
  Administered 2024-09-14: 2 mg via INTRAVENOUS

## 2024-09-14 MED ORDER — ACETAMINOPHEN 500 MG PO TABS
ORAL_TABLET | ORAL | Status: AC
  Filled 2024-09-14: qty 2

## 2024-09-14 MED ORDER — FENTANYL CITRATE (PF) 50 MCG/ML IJ SOSY
50.0000 ug | PREFILLED_SYRINGE | Freq: Once | INTRAMUSCULAR | Status: AC
  Administered 2024-09-14: 50 ug via INTRAVENOUS

## 2024-09-14 MED ORDER — HYDROMORPHONE HCL 1 MG/ML IJ SOLN
0.5000 mg | INTRAMUSCULAR | Status: DC | PRN
  Administered 2024-09-14: 0.5 mg via INTRAVENOUS
  Administered 2024-09-14 – 2024-09-25 (×54): 1 mg via INTRAVENOUS
  Administered 2024-09-25: 0.5 mg via INTRAVENOUS
  Administered 2024-09-25 (×2): 1 mg via INTRAVENOUS
  Administered 2024-09-25: 0.5 mg via INTRAVENOUS
  Administered 2024-09-26 – 2024-09-27 (×9): 1 mg via INTRAVENOUS
  Administered 2024-09-27 – 2024-09-28 (×2): 0.5 mg via INTRAVENOUS
  Administered 2024-09-28: 1 mg via INTRAVENOUS
  Administered 2024-09-28: 0.5 mg via INTRAVENOUS
  Administered 2024-09-28: 1 mg via INTRAVENOUS
  Administered 2024-09-28: 0.5 mg via INTRAVENOUS
  Administered 2024-09-29 – 2024-09-30 (×11): 1 mg via INTRAVENOUS
  Administered 2024-10-01: 0.5 mg via INTRAVENOUS
  Administered 2024-10-01: 1 mg via INTRAVENOUS
  Administered 2024-10-01: 0.5 mg via INTRAVENOUS
  Filled 2024-09-14 (×14): qty 1
  Filled 2024-09-14: qty 0.5
  Filled 2024-09-14 (×11): qty 1
  Filled 2024-09-14: qty 0.5
  Filled 2024-09-14 (×2): qty 1
  Filled 2024-09-14 (×2): qty 0.5
  Filled 2024-09-14 (×12): qty 1
  Filled 2024-09-14: qty 0.5
  Filled 2024-09-14: qty 1
  Filled 2024-09-14: qty 0.5
  Filled 2024-09-14 (×7): qty 1
  Filled 2024-09-14: qty 0.5
  Filled 2024-09-14 (×3): qty 1
  Filled 2024-09-14: qty 0.5
  Filled 2024-09-14 (×7): qty 1
  Filled 2024-09-14: qty 0.5
  Filled 2024-09-14 (×16): qty 1

## 2024-09-14 MED ORDER — CHLORHEXIDINE GLUCONATE 0.12 % MT SOLN: 15.0000 mL | Freq: Once | OROMUCOSAL | Status: DC

## 2024-09-14 MED ORDER — ANTICOAGULANT SODIUM CITRATE 4% (200MG/5ML) IV SOLN: 5.0000 mL | Status: DC | PRN

## 2024-09-14 NOTE — Consult Note (Signed)
 CARDIOLOGY CONSULT NOTE       Patient ID: Linda [name] MRN: [id number] DOB/AGE: 1996-04-19 29 y.o.  Admit date: 09/13/2024 Referring Physician: [name] Primary Physician: [name], NP Primary Cardiologist: Wonda Reason for Consultation: Preoperative/CHF/Elevated Troponin  Principal Problem:   Problem with dialysis access Active Problems:   Essential hypertension   Elevated troponin   ESRD (end stage renal disease) (HCC)   GERD without esophagitis   HFrEF (heart failure with reduced ejection fraction) (HCC)   Acute systolic congestive heart failure (HCC)   HPI:  29 y.o. admitted after dialysis to ER yesterday. Having severe pain and swelling in dialysis fistula LUE. History of non ischemic DCM with EF 30-35% SSCP atypical helped with fentanyl  worse with motion and having water  too fast.  She was brought to short stay from ER for preoperative fistula revision. ECG is non acute Troponin in setting of CRF 112/118 no trend. She has some dyspnea Biggest issue is pain in her LUE  ROS All other systems reviewed and negative except as noted above  Past Medical History:  Diagnosis Date   Abnormal ECG    a. In setting of LVH w/ repolarization abnormalities; b. 03/2019 MV: No ischemia/infarct. EF 55-65%.   Abscess of buttock, right 01/11/2024   Acute hypoxic respiratory failure (HCC) 10/10/2023   Acute on chronic heart failure with mildly reduced ejection fraction (HFmrEF, 41-49%) (HCC) 10/10/2023   Acute pulmonary edema (HCC) 09/30/2021   Anaphylactic shock, unspecified, initial encounter 10/02/2021   Angioedema    Aspiration pneumonia (HCC) 03/07/2024   Encounter for screening for respiratory tuberculosis 10/02/2021   ESRD on hemodialysis (HCC)    G6PD deficiency    GERD (gastroesophageal reflux disease)    High anion gap metabolic acidosis 09/02/2021   HUS (hemolytic uremic syndrome), atypical (HCC) 05/04/2018   Hypertension    Hypertensive urgency 11/29/2017    Infection due to Strongyloides 05/28/2021   Lupus    LVH (left ventricular hypertrophy)    a. 11/2017 Echo: EF 50-55%. triv AI. Mild MR. Mod L and mild R atrial enlargement. Nl RV size/fxn. Small pericardial effusion w/o tampondae; b. 03/2018 Echo: EF 55-60%, restrictive filling pattern. Nl RV size/fxn. Sev LAE. Mild MR, mild to mod TR/PR. Mod PAH. Triv effusion.   Migraines    Mixed connective tissue disease    Positive D dimer 12/22/2023   Previous Dialysis patient [facility])    a. Off HD since late 2020.   Resistant hypertension    Septic prepatellar bursitis of right knee 08/19/2017   Severe sepsis (HCC) 01/06/2018    Family History  Problem Relation Age of Onset   Lupus Mother    Hypertension Mother    Kidney disease Mother    Heart Problems Mother    Coronary artery disease Mother 37       stent   Diabetes Maternal Grandmother     Social History   Socioeconomic History   Marital status: Single    Spouse name: Not on file   Number of children: 0   Years of education: Not on file   Highest education level: Some college, no degree  Occupational History   Not on file  Tobacco Use   Smoking status: Every Day    Types: E-cigarettes, Cigars   Smokeless tobacco: Never   Tobacco comments:    02/03/2024 Patient smokes 1 cigars daily since 2016  Vaping Use   Vaping status: Some Days  Substance and Sexual Activity   Alcohol use: No  Alcohol/week: 0.0 standard drinks of alcohol   Drug use: No   Sexual activity: Yes    Partners: Male    Birth control/protection: None  Other Topics Concern   Not on file  Social History Narrative   Lives with boyfriend   Social Drivers of Health   Tobacco Use: High Risk (09/14/2024)   Patient History    Smoking Tobacco Use: Every Day    Smokeless Tobacco Use: Never    Passive Exposure: Not on file  Financial Resource Strain: Low Risk (02/03/2024)   Overall Financial Resource Strain (CARDIA)    Difficulty of Paying Living Expenses: Not  hard at all  Food Insecurity: Low Risk (06/02/2024)   Received from [facility]   Epic    Within the past 12 months, you worried that your food would run out before you got money to buy more: Never true    Within the past 12 months, the food you bought just didn't last and you didn't have money to get more. : Never true  Transportation Needs: No Transportation Needs (06/02/2024)   Received from [employer]    In the past 12 months, has lack of reliable transportation kept you from medical appointments, meetings, work or from getting things needed for daily living? : No  Physical Activity: Insufficiently Active (02/03/2024)   Exercise Vital Sign    Days of Exercise per Week: 2 days    Minutes of Exercise per Session: 10 min  Stress: No Stress Concern Present (02/03/2024)   [employer] of Occupational Health - Occupational Stress Questionnaire    Feeling of Stress : Not at all  Social Connections: Socially Isolated (02/03/2024)   Social Connection and Isolation Panel    Frequency of Communication with Friends and Family: Never    Frequency of Social Gatherings with Friends and Family: Once a week    Attends Religious Services: Never    Database Administrator or Organizations: Yes    Attends Banker Meetings: Never    Marital Status: Never married  Intimate Partner Violence: Not At Risk (05/30/2024)   Epic    Fear of Current or Ex-Partner: No    Emotionally Abused: No    Physically Abused: No    Sexually Abused: No  Depression (PHQ2-9): Low Risk (02/03/2024)   Depression (PHQ2-9)    PHQ-2 Score: 0  Recent Concern: Depression (PHQ2-9) - Medium Risk (01/26/2024)   Depression (PHQ2-9)    PHQ-2 Score: 5  Alcohol Screen: Low Risk (02/03/2024)   Alcohol Screen    Last Alcohol Screening Score (AUDIT): 1  Housing: Low Risk (06/02/2024)   Received from [facility]   Epic    What is your living situation today?: I have a steady place to live    Think about  the place you live. Do you have problems with any of the following? Choose all that apply:: None/None on this list  Utilities: Low Risk (06/02/2024)   Received from [facility]   Utilities    In the past 12 months has the electric, gas, oil, or water  company threatened to shut off services in your home? : No  Health Literacy: Adequate Health Literacy (02/03/2024)   B1300 Health Literacy    Frequency of need for help with medical instructions: Never    Past Surgical History:  Procedure Laterality Date   A/V FISTULAGRAM N/A 03/10/2024   Procedure: A/V Fistulagram;  Surgeon: Serene Gaile ORN, MD;  Location: [facility];  Service: Cardiovascular;  Laterality: N/A;   AV FISTULA PLACEMENT Left 09/06/2024   Procedure: BRACHIOBASILIC ARTERIOVENOUS (AV) FISTULA CREATION;  Surgeon: Serene Gaile ORN, MD;  Location: [facility] OR;  Service: Vascular;  Laterality: Left;   GRAFT APPLICATION Left 03/30/2019   Procedure: BRACHIAL CEPHALIC GRAFT CREATION;  Surgeon: Marea Selinda RAMAN, MD;  Location: [facility] ORS;  Service: Vascular;  Laterality: Left;   I & D EXTREMITY Right 08/06/2017   Procedure: IRRIGATION AND DEBRIDEMENT KNEE;  Surgeon: Kendal Franky SQUIBB, MD;  Location: [facility] OR;  Service: Orthopedics;  Laterality: Right;   IR AV DIALY SHUNT INTRO NEEDLE/INTRACATH INITIAL W/PTA/IMG LEFT  05/23/2024   IR REMOVAL TUN CV CATH W/O FL  05/24/2024   IR THROMBECTOMY AV FISTULA W/THROMBOLYSIS/PTA INC/SHUNT/IMG LEFT Left 05/30/2024   IR THROMBECTOMY AV FISTULA W/THROMBOLYSIS/PTA/STENT INC/SHUNT/IMG LT Left 05/18/2022   IR TUNNELED CENTRAL VENOUS CATH PLC W IMG  05/19/2024   IR US  GUIDE VASC ACCESS LEFT  05/18/2022   IR US  GUIDE VASC ACCESS LEFT  05/23/2024   IR US  GUIDE VASC ACCESS LEFT  05/30/2024   MULTIPLE TOOTH EXTRACTIONS     RADIOLOGY WITH ANESTHESIA N/A 05/23/2024   Procedure: RADIOLOGY WITH ANESTHESIA;  Surgeon: Jennefer Ester PARAS, MD;  Location: [facility] OR;  Service: Radiology;  Laterality: N/A;   RADIOLOGY WITH ANESTHESIA N/A 05/30/2024    Procedure: RADIOLOGY WITH ANESTHESIA;  Surgeon: Philip Cornet, MD;  Location: [facility] OR;  Service: Radiology;  Laterality: N/A;   REMOVAL OF GRAFT Left 09/06/2024   Procedure: RESECTION, GRAFT;  Surgeon: Serene Gaile ORN, MD;  Location: [facility] OR;  Service: Vascular;  Laterality: Left;     Current Medications[1]  acetaminophen        acetaminophen   1,000 mg Oral Once   [MAR Hold] amLODipine   10 mg Oral QHS   [MAR Hold] carvedilol   25 mg Oral BID WC   chlorhexidine   15 mL Mouth/Throat Once   Or   mouth rinse  15 mL Mouth Rinse Once   [MAR Hold] cloNIDine   0.1 mg Oral Daily   [MAR Hold] doxazosin   2 mg Oral Daily   fentaNYL        [MAR Hold] furosemide   80 mg Oral Daily   [MAR Hold] irbesartan   300 mg Oral Daily   [MAR Hold] pantoprazole   40 mg Oral Daily   [MAR Hold] sucralfate   1 g Oral TID WC & HS    sodium chloride       Physical Exam: Blood pressure (!) 124/90, pulse 75, temperature 98.9 F (37.2 C), temperature source Oral, resp. rate (!) 22, height 5' 6 (1.676 m), weight 72.6 kg, SpO2 91%.    Chronically ill black female Rales Left > right 1/2 way up lungs SEM flow murmur from fistual Abdomen benign Severe LUE swelling with fistula wrapped She does Have a left radial pulse Dialysis catheter right subclavian Plus 2 LE edema bilateral   Labs:   Lab Results  Component Value Date   WBC 7.8 09/14/2024   HGB 8.3 (L) 09/14/2024   HCT 28.0 (L) 09/14/2024   MCV 94.6 09/14/2024   PLT 289 09/14/2024    Recent Labs  Lab 09/13/24 1454 09/14/24 0411  NA 134 134*  K 4.1 3.9  CL 93 95*  CO2 25 27  BUN 22 27*  CREATININE 5.56 6.35*  CALCIUM  9.0 8.2*  PROT 7.9  --   BILITOT 1.1  --   ALKPHOS 174  --   ALT 7  --   AST 26  --  GLUCOSE 82 100*   Lab Results  Component Value Date   CKTOTAL 46 04/20/2017   TROPONINI <0.03 08/22/2018   No results found for: CHOL No results found for: HDL No results found for: [facility] Lab Results  Component Value Date   TRIG 155 (H)  03/10/2024   TRIG 1,332 (H) 03/07/2024   No results found for: CHOLHDL No results found for: LDLDIRECT    Radiology: UE Venous Duplex ([facility] ONLY) Result Date: 09/13/2024 UPPER VENOUS STUDY  Patient Name:  TEVIS CONGER Pullman  Date of Exam:   09/13/2024 Medical Rec #: [id number]              Accession #:    [id number] Date of Birth: 14-Dec-1995              Patient Gender: F Patient Age:   74 years Exam Location:  [facility] Procedure:      VAS US  UPPER EXTREMITY VENOUS DUPLEX Referring Phys: ALEXIS YOUNG --------------------------------------------------------------------------------  Indications: Left arm edema, cellulitis vs DVT. Performing Technologist: Edilia Elden Appl  Examination Guidelines: A complete evaluation includes B-mode imaging, spectral Doppler, color Doppler, and power Doppler as needed of all accessible portions of each vessel. Bilateral testing is considered an integral part of a complete examination. Limited examinations for reoccurring indications may be performed as noted.  Right Findings: +----------+------------+---------+-----------+----------+-------+ RIGHT     CompressiblePhasicitySpontaneousPropertiesSummary +----------+------------+---------+-----------+----------+-------+ IJV           Full       Yes       Yes                      +----------+------------+---------+-----------+----------+-------+ Subclavian               Yes       Yes                      +----------+------------+---------+-----------+----------+-------+  Left Findings: +----------+------------+---------+-----------+----------+-------+ LEFT      CompressiblePhasicitySpontaneousPropertiesSummary +----------+------------+---------+-----------+----------+-------+ IJV           Full       Yes       Yes                      +----------+------------+---------+-----------+----------+-------+ Subclavian    Full       Yes       Yes                       +----------+------------+---------+-----------+----------+-------+ Axillary      Full       Yes       Yes                      +----------+------------+---------+-----------+----------+-------+ Brachial      Full       Yes       Yes              Limited +----------+------------+---------+-----------+----------+-------+ Radial        Full                                          +----------+------------+---------+-----------+----------+-------+ Ulnar         Full                                          +----------+------------+---------+-----------+----------+-------+  Cephalic      Full       Yes       Yes                      +----------+------------+---------+-----------+----------+-------+ Basilic                                               AVF   +----------+------------+---------+-----------+----------+-------+ Limited exam due to patient's pain and excessive swelling. Patent fistula. Occluded graft.  Summary:  Right: No evidence of thrombosis in the subclavian.  Left: No evidence of deep vein thrombosis in the upper extremity. No evidence of superficial vein thrombosis in the upper extremity.  *See table(s) above for measurements and observations.  Diagnosing physician: Lonni Gaskins MD Electronically signed by Lonni Gaskins MD on 09/13/2024 at 8:13:03 PM.    Final    UE Venous Duplex ([facility] ONLY) Result Date: 09/08/2024 UPPER VENOUS STUDY  Patient Name:  CALLAN YONTZ Schwier  Date of Exam:   09/08/2024 Medical Rec #: [id number]              Accession #:    [id number] Date of Birth: 1995-12-29              Patient Gender: F Patient Age:   69 years Exam Location:  [facility] Procedure:      VAS US  UPPER EXTREMITY VENOUS DUPLEX Referring Phys: MERL SOTO --------------------------------------------------------------------------------  Indications: Swelling Risk Factors: 09/06/2024 - Left brachiobasilic AVF creation. Limitations: Poor ultrasound/tissue  interface, patient pain tolerance and open wound. Comparison Study: No prior studies. Performing Technologist: Cordella Collet RVT  Examination Guidelines: A complete evaluation includes B-mode imaging, spectral Doppler, color Doppler, and power Doppler as needed of all accessible portions of each vessel. Bilateral testing is considered an integral part of a complete examination. Limited examinations for reoccurring indications may be performed as noted.  Right Findings: +----------+------------+---------+-----------+----------+-------+ RIGHT     CompressiblePhasicitySpontaneousPropertiesSummary +----------+------------+---------+-----------+----------+-------+ Subclavian               Yes       Yes                      +----------+------------+---------+-----------+----------+-------+  Left Findings: +----------+------------+---------+-----------+----------+-------+ LEFT      CompressiblePhasicitySpontaneousPropertiesSummary +----------+------------+---------+-----------+----------+-------+ IJV           Full       Yes       Yes                      +----------+------------+---------+-----------+----------+-------+ Subclavian               Yes       Yes                      +----------+------------+---------+-----------+----------+-------+ Axillary      Full       Yes       Yes                      +----------+------------+---------+-----------+----------+-------+ Brachial      Full                                          +----------+------------+---------+-----------+----------+-------+  Radial        Full                                          +----------+------------+---------+-----------+----------+-------+ Ulnar         Full                                          +----------+------------+---------+-----------+----------+-------+ Cephalic      Full                                           +----------+------------+---------+-----------+----------+-------+ Basilic       None                                    AVF   +----------+------------+---------+-----------+----------+-------+  Summary:  Right: No evidence of thrombosis in the subclavian.  Left: No evidence of deep vein thrombosis in the upper extremity. No evidence of superficial vein thrombosis in the upper extremity.  *See table(s) above for measurements and observations.  Diagnosing physician: Fonda Rim Electronically signed by Fonda Rim on 09/08/2024 at 2:41:24 PM.    Final    DG Chest Portable 1 View Result Date: 09/08/2024 CLINICAL DATA:  Shortness of breath.  Recently missed hemodialysis. EXAM: PORTABLE CHEST 1 VIEW COMPARISON:  05/29/2024 FINDINGS: Marked cardiac enlargement remains stable. Right jugular dual-lumen central venous catheter is seen in appropriate position. Mild diffuse interstitial infiltrates are seen, consistent with mild interstitial edema. No No evidence of pulmonary consolidation or pleural effusion. IMPRESSION: Mild diffuse interstitial edema. Stable cardiomegaly. Electronically Signed   By: Norleen DELENA Kil M.D.   On: 09/08/2024 11:25    EKG: SR rate 75 QT 446 normal ST segments   ASSESSMENT AND PLAN:   Chest pain:  non anginal no trend in mildly elevated troponin in setting of CRF. No acute ECG changes. No reason to repeat echo from 09/12/23 as she has known non ischemic DCM with prior EF 30-35% and this echo showing improvement to 55% with severe LVH and normal RV. No pericardial effusion Patient indicates that morphine  works better than fentanyl  for pain  CHF:  despite echo showing improved EF on 09/12/23 she is volume overloaded and in CHF. ? Diastolic component with severe LVH and CRF. Will check CXR. Needs dialysis again today. Would optimize fluid status before revising LUE fistula VVS:  she has temporary dialysis catheter in right subclavian. Consider LUE venous duplex to r/o thormbus. Needs  fistula revision with pain and gross edema in LUE Radial pulse intact CRF:  would have nephrology dialyze today  K 3.9 BUN 27 CR 6.35   Signed: Maude Emmer 09/14/2024, 12:35 PM      [1]  Current Facility-Administered Medications:    0.9 %  sodium chloride  infusion, , Intravenous, Continuous, Darlyn Norleen, MD   acetaminophen  (TYLENOL ) 500 MG tablet, , , ,    acetaminophen  (TYLENOL ) tablet 1,000 mg, 1,000 mg, Oral, Once, Darlyn Norleen, MD   [facility] Hold] amLODipine  (NORVASC ) tablet 10 mg, 10 mg, Oral, QHS, Rathore, Vasundhra, MD, 10 mg at 09/13/24 2308   [MAR Hold] carvedilol  (COREG ) tablet  25 mg, 25 mg, Oral, BID WC, Rathore, Vasundhra, MD, 25 mg at 09/13/24 2308   chlorhexidine  (PERIDEX ) 0.12 % solution 15 mL, 15 mL, Mouth/Throat, Once **OR** Oral care mouth rinse, 15 mL, Mouth Rinse, Once, Darlyn Rush, MD   [facility] Hold] cloNIDine  (CATAPRES ) tablet 0.1 mg, 0.1 mg, Oral, Daily, Rathore, Vasundhra, MD   [MAR Hold] doxazosin  (CARDURA ) tablet 2 mg, 2 mg, Oral, Daily, Rathore, Vasundhra, MD   fentaNYL  (SUBLIMAZE ) 100 MCG/2ML injection, , , ,    [MAR Hold] fentaNYL  (SUBLIMAZE ) injection 12.5 mcg, 12.5 mcg, Intravenous, Q4H PRN, Rathore, Vasundhra, MD, 12.5 mcg at 09/14/24 0554   [MAR Hold] furosemide  (LASIX ) tablet 80 mg, 80 mg, Oral, Daily, Rathore, Vasundhra, MD   [MAR Hold] hydrALAZINE  (APRESOLINE ) injection 5 mg, 5 mg, Intravenous, Q6H PRN, Rathore, Vasundhra, MD   [MAR Hold] irbesartan  (AVAPRO ) tablet 300 mg, 300 mg, Oral, Daily, Alfornia Madison, MD   morphine  (PF) 2 MG/ML injection 1-2 mg, 1-2 mg, Intravenous, Q4H PRN, Adella Manolis C, MD   [MAR Hold] naloxone  (NARCAN ) injection 0.4 mg, 0.4 mg, Intravenous, PRN, Rathore, Vasundhra, MD   [MAR Hold] oxyCODONE  (Oxy IR/ROXICODONE ) immediate release tablet 5 mg, 5 mg, Oral, Q8H PRN, Rathore, Vasundhra, MD   [MAR Hold] pantoprazole  (PROTONIX ) EC tablet 40 mg, 40 mg, Oral, Daily, Rathore, Vasundhra, MD   [MAR Hold] sucralfate  (CARAFATE )  tablet 1 g, 1 g, Oral, TID WC & HS, Alfornia Madison, MD

## 2024-09-14 NOTE — Progress Notes (Signed)
 MD notified that patient is complaining of chest pain (described like pain after I am drinking a soda supper fast). Per Dr. [name], patient needs a cardiac consult prior to surgery. OR notified.

## 2024-09-14 NOTE — Progress Notes (Signed)
 CRITICAL RESULT PROVIDER NOTIFICATION  Test performed and critical result:  Troponin 112   Date and time result received:  09/14/24 - 10:18 o'clock  Provider name/title: Dr. Darlyn  Date and time provider notified: 09/14/24 - 10:29 o'clock  Date and time provider responded: 09/14/24 - 10:29 o'clock  Provider response:In department

## 2024-09-14 NOTE — Consult Note (Signed)
 [facility] Renal Consultation Note    Indication for Consultation:  Management of ESRD/hemodialysis; anemia, hypertension/volume and secondary hyperparathyroidism  ERE:Tpoopjfdnw, Philippe SAUNDERS, NP  HPI: Linda [name] is a 29 y.o. female with ESRD on HD MWF at [facility]. She has a past medical history significant for HTN, G6PD deficiency, CHF, and lupus. Patient presents to the ED d/t malfunctioned AVF. It was noted of multiple AVF revisions in the past and most recent revision was on 09/07/23.  Patient presented to the ED c/o LUE pain and swelling. She was scheduled with [facility] for AVF ligation for today. She has a functional TDC for dialysis. Pre-operatively, patient was c/o active chest CP and troponins are elevated. Cardiology has been consulted and scheduled ligation was aborted. Reviewed outpatient HD records. She is consistently not reaching her EDW and signs off early occasionally. Signed off early in outpatient yesterday. Awaiting new CXR results. Seen and examined patient at bedside. She reports ongoing CP and on O2. She's not in distress and denies N/V. Plan for HD today and will assess tomorrow for HD needs. AVF ligation has been rescheduled for tomorrow.  Past Medical History:  Diagnosis Date   Abnormal ECG    a. In setting of LVH w/ repolarization abnormalities; b. 03/2019 MV: No ischemia/infarct. EF 55-65%.   Abscess of buttock, right 01/11/2024   Acute hypoxic respiratory failure (HCC) 10/10/2023   Acute on chronic heart failure with mildly reduced ejection fraction (HFmrEF, 41-49%) (HCC) 10/10/2023   Acute pulmonary edema (HCC) 09/30/2021   Anaphylactic shock, unspecified, initial encounter 10/02/2021   Angioedema    Aspiration pneumonia (HCC) 03/07/2024   Encounter for screening for respiratory tuberculosis 10/02/2021   ESRD on hemodialysis (HCC)    G6PD deficiency    GERD (gastroesophageal reflux disease)    High anion gap metabolic acidosis  09/02/2021   HUS (hemolytic uremic syndrome), atypical (HCC) 05/04/2018   Hypertension    Hypertensive urgency 11/29/2017   Infection due to Strongyloides 05/28/2021   Lupus    LVH (left ventricular hypertrophy)    a. 11/2017 Echo: EF 50-55%. triv AI. Mild MR. Mod L and mild R atrial enlargement. Nl RV size/fxn. Small pericardial effusion w/o tampondae; b. 03/2018 Echo: EF 55-60%, restrictive filling pattern. Nl RV size/fxn. Sev LAE. Mild MR, mild to mod TR/PR. Mod PAH. Triv effusion.   Migraines    Mixed connective tissue disease    Positive D dimer 12/22/2023   Previous Dialysis patient [facility])    a. Off HD since late 2020.   Resistant hypertension    Septic prepatellar bursitis of right knee 08/19/2017   Severe sepsis (HCC) 01/06/2018   Past Surgical History:  Procedure Laterality Date   A/V FISTULAGRAM N/A 03/10/2024   Procedure: A/V Fistulagram;  Surgeon: Serene Gaile ORN, MD;  Location: [facility] INVASIVE CV LAB;  Service: Cardiovascular;  Laterality: N/A;   AV FISTULA PLACEMENT Left 09/06/2024   Procedure: BRACHIOBASILIC ARTERIOVENOUS (AV) FISTULA CREATION;  Surgeon: Serene Gaile ORN, MD;  Location: [facility] OR;  Service: Vascular;  Laterality: Left;   GRAFT APPLICATION Left 03/30/2019   Procedure: BRACHIAL CEPHALIC GRAFT CREATION;  Surgeon: Marea Selinda RAMAN, MD;  Location: [facility] ORS;  Service: Vascular;  Laterality: Left;   I & D EXTREMITY Right 08/06/2017   Procedure: IRRIGATION AND DEBRIDEMENT KNEE;  Surgeon: Kendal Franky SQUIBB, MD;  Location: [facility] OR;  Service: Orthopedics;  Laterality: Right;   IR AV DIALY SHUNT INTRO NEEDLE/INTRACATH INITIAL W/PTA/IMG LEFT  05/23/2024   IR  REMOVAL TUN CV CATH W/O FL  05/24/2024   IR THROMBECTOMY AV FISTULA W/THROMBOLYSIS/PTA INC/SHUNT/IMG LEFT Left 05/30/2024   IR THROMBECTOMY AV FISTULA W/THROMBOLYSIS/PTA/STENT INC/SHUNT/IMG LT Left 05/18/2022   IR TUNNELED CENTRAL VENOUS CATH PLC W IMG  05/19/2024   IR US  GUIDE VASC ACCESS LEFT  05/18/2022   IR US  GUIDE VASC ACCESS LEFT   05/23/2024   IR US  GUIDE VASC ACCESS LEFT  05/30/2024   MULTIPLE TOOTH EXTRACTIONS     RADIOLOGY WITH ANESTHESIA N/A 05/23/2024   Procedure: RADIOLOGY WITH ANESTHESIA;  Surgeon: Jennefer Ester PARAS, MD;  Location: [facility] OR;  Service: Radiology;  Laterality: N/A;   RADIOLOGY WITH ANESTHESIA N/A 05/30/2024   Procedure: RADIOLOGY WITH ANESTHESIA;  Surgeon: Philip Cornet, MD;  Location: [facility] OR;  Service: Radiology;  Laterality: N/A;   REMOVAL OF GRAFT Left 09/06/2024   Procedure: RESECTION, GRAFT;  Surgeon: Serene Gaile ORN, MD;  Location: [facility] OR;  Service: Vascular;  Laterality: Left;   Family History  Problem Relation Age of Onset   Lupus Mother    Hypertension Mother    Kidney disease Mother    Heart Problems Mother    Coronary artery disease Mother 68       stent   Diabetes Maternal Grandmother    Social History:  reports that she has been smoking e-cigarettes and cigars. She has never used smokeless tobacco. She reports that she does not drink alcohol and does not use drugs. Allergies[1] Prior to Admission medications  Medication Sig Start Date End Date Taking? Authorizing Provider  acetaminophen  (TYLENOL ) 325 MG tablet Take 2 tablets (650 mg total) by mouth every 6 (six) hours. 03/11/24  Yes Elgergawy, Brayton RAMAN, MD  amLODipine  (NORVASC ) 10 MG tablet Take 1 tablet (10 mg total) by mouth at bedtime. Patient taking differently: Take 10 mg by mouth daily. 05/24/24  Yes Laurence Locus, DO  carvedilol  (COREG ) 25 MG tablet Take 1 tablet (25 mg total) by mouth 2 (two) times daily with a meal. 05/17/24  Yes [name], NP  cloNIDine  (CATAPRES ) 0.1 MG tablet Take 1 tablet (0.1 mg total) by mouth daily. 03/11/24  Yes Elgergawy, Brayton RAMAN, MD  doxazosin  (CARDURA ) 2 MG tablet Take 1 tablet (2 mg total) by mouth daily. 05/24/24 09/14/24 Yes Laurence Locus, DO  furosemide  (LASIX ) 80 MG tablet Take 1 tablet (80 mg total) by mouth daily. 06/12/24  Yes [name], NP  irbesartan  (AVAPRO ) 300 MG tablet Take 1  tablet (300 mg total) by mouth daily. 05/17/24  Yes [name], NP  nitroGLYCERIN  (NITROSTAT ) 0.4 MG SL tablet Place 1 tablet (0.4 mg total) under the tongue every 5 (five) minutes as needed for chest pain. 10/28/23  Yes Rai, Ripudeep K, MD  ondansetron  (ZOFRAN ) 4 MG tablet Take 1 tablet (4 mg total) by mouth every 6 (six) hours. Patient taking differently: Take 4 mg by mouth every 8 (eight) hours as needed for nausea, vomiting or refractory nausea / vomiting. 04/06/24  Yes Lang Norleen POUR, PA-C  oxyCODONE  (ROXICODONE ) 5 MG immediate release tablet Take 1 tablet (5 mg total) by mouth every 4 (four) hours as needed for severe pain (pain score 7-10). 09/06/24  Yes Schuh, McKenzi P, PA-C  sucralfate  (CARAFATE ) 1 g tablet Take 1 tablet (1 g total) by mouth 4 (four) times daily -  with meals and at bedtime. 09/09/24  Yes Vicky Charleston, PA-C  tiZANidine  (ZANAFLEX ) 4 MG tablet TAKE 1 TABLET (4 MG TOTAL) BY MOUTH DAILY AS NEEDED FOR  MUSCLE SPASM 06/09/24  Yes Billy Craze R, NP  allopurinol  (ZYLOPRIM ) 100 MG tablet Take 1 tablet (100 mg total) by mouth daily. Patient not taking: Reported on 09/01/2024 03/11/24   Elgergawy, Brayton RAMAN, MD  lidocaine -prilocaine  (EMLA ) cream Apply 1 Application topically daily as needed (for dialysis). 01/14/24   [provider]  omeprazole  (PRILOSEC) 20 MG capsule Take 1 capsule (20 mg total) by mouth daily. Patient not taking: Reported on 09/14/2024 09/09/24   Vicky Charleston, PA-C  sevelamer  carbonate (RENVELA ) 2.4 g PACK Take 2.4 g by mouth daily. Patient not taking: Reported on 09/01/2024 01/21/24   [provider]   Current Facility-Administered Medications  Medication Dose Route Frequency Provider Last Rate Last Admin   acetaminophen  (TYLENOL ) 500 MG tablet            alteplase  (CATHFLO ACTIVASE ) injection 2 mg  2 mg Intracatheter Once PRN Stovall, Kathryn R, PA-C       amLODipine  (NORVASC ) tablet 10 mg  10 mg Oral QHS Rathore, Vasundhra, MD   10  mg at 09/13/24 2308   anticoagulant sodium citrate  solution 5 mL  5 mL Intracatheter PRN Stovall, Kathryn R, PA-C       carvedilol  (COREG ) tablet 25 mg  25 mg Oral BID WC Rathore, Vasundhra, MD   25 mg at 09/13/24 2308   [START ON 09/15/2024] Chlorhexidine  Gluconate Cloth 2 % PADS 6 each  6 each Topical Q0600 Wylee Dorantes E, NP       cloNIDine  (CATAPRES ) tablet 0.1 mg  0.1 mg Oral Daily Rathore, Vasundhra, MD       doxazosin  (CARDURA ) tablet 2 mg  2 mg Oral Daily Rathore, Vasundhra, MD       fentaNYL  (SUBLIMAZE ) 100 MCG/2ML injection            fentaNYL  (SUBLIMAZE ) injection 12.5 mcg  12.5 mcg Intravenous Q4H PRN Rathore, Vasundhra, MD   12.5 mcg at 09/14/24 1428   furosemide  (LASIX ) tablet 80 mg  80 mg Oral Daily Rathore, Vasundhra, MD       heparin  injection 1,000 Units  1,000 Units Dialysis PRN Lenon Charmaine BRAVO, NP       heparin  injection 1,000 Units  1,000 Units Intracatheter PRN Stovall, Kathryn R, PA-C       heparin  injection 2,000 Units  2,000 Units Dialysis Once in dialysis Stovall, Kathryn R, PA-C       hydrALAZINE  (APRESOLINE ) injection 5 mg  5 mg Intravenous Q6H PRN Alfornia Madison, MD       irbesartan  (AVAPRO ) tablet 300 mg  300 mg Oral Daily Alfornia Madison, MD       lidocaine  (PF) (XYLOCAINE ) 1 % injection 5 mL  5 mL Intradermal PRN Stovall, Kathryn R, PA-C       lidocaine -prilocaine  (EMLA ) cream 1 Application  1 Application Topical PRN Stovall, Kathryn R, PA-C       morphine  (PF) 2 MG/ML injection 1-2 mg  1-2 mg Intravenous Q4H PRN Nishan, Peter C, MD   2 mg at 09/14/24 1258   morphine  (PF) 2 MG/ML injection            mupirocin  ointment (BACTROBAN ) 2 % 1 Application  1 Application Nasal BID Rai, Ripudeep K, MD       naloxone  (NARCAN ) injection 0.4 mg  0.4 mg Intravenous PRN Rathore, Vasundhra, MD       nitroGLYCERIN  (NITROSTAT ) SL tablet 0.4 mg  0.4 mg Sublingual Q5 min PRN Rai, Nydia POUR, MD       oxyCODONE  (  Oxy IR/ROXICODONE ) immediate release tablet 5 mg  5 mg  Oral Q8H PRN Alfornia Madison, MD       pantoprazole  (PROTONIX ) EC tablet 40 mg  40 mg Oral Daily Alfornia Madison, MD       pentafluoroprop-tetrafluoroeth (GEBAUERS) aerosol 1 Application  1 Application Topical PRN Stovall, Kathryn R, PA-C       sucralfate  (CARAFATE ) tablet 1 g  1 g Oral TID WC & HS Alfornia Madison, MD       Labs: Basic Metabolic Panel: Recent Labs  Lab 09/08/24 1105 09/08/24 1110 09/13/24 1414 09/13/24 1454 09/14/24 0411  NA 131*   < > 131* 134 134*  K 6.5*   < > 5.1 4.1 3.9  CL 97*   < > 98 93 95*  CO2 12*  --   --  25 27  GLUCOSE 76   < > 86 82 100*  BUN 82*   < > 30* 22 27*  CREATININE 12.70*   < > 6.10* 5.56 6.35*  CALCIUM  9.7  --   --  9.0 8.2*   < > = values in this interval not displayed.   Liver Function Tests: Recent Labs  Lab 09/08/24 1105 09/13/24 1454  AST 17 26  ALT 5 7  ALKPHOS 221* 174  BILITOT 0.9 1.1  PROT 8.0 7.9  ALBUMIN  3.1* 3.3   Recent Labs  Lab 09/08/24 1105  LIPASE <10*   No results for input(s): AMMONIA in the last 168 hours. CBC: Recent Labs  Lab 09/08/24 1105 09/08/24 1110 09/13/24 1414 09/13/24 1454 09/14/24 0411  WBC 13.2*  --   --  9.2 7.8  NEUTROABS 10.8*  --   --  5.6  --   HGB 10.2*   < > 10.2* 9.0* 8.3*  HCT 33.8*   < > 30.0* 29.4* 28.0*  MCV 93.9  --   --  92.7 94.6  PLT 300  --   --  292 289   < > = values in this interval not displayed.   Cardiac Enzymes: No results for input(s): CKTOTAL, CKMB, CKMBINDEX, TROPONINI in the last 168 hours. CBG: Recent Labs  Lab 09/08/24 1351  GLUCAP 104*   Iron  Studies: No results for input(s): IRON , TIBC, TRANSFERRIN, FERRITIN in the last 72 hours. Studies/Results: UE Venous Duplex ([facility] and [facility] ONLY) Result Date: 09/13/2024 UPPER VENOUS STUDY  Patient Name:  Linda [name] Langone  Date of Exam:   09/13/2024 Medical Rec #: [id number]              Accession #:    [id number] Date of Birth: 11/15/1995              Patient Gender: F Patient Age:    24 years Exam Location:  [facility] Procedure:      VAS US  UPPER EXTREMITY VENOUS DUPLEX Referring Phys: ALEXIS YOUNG --------------------------------------------------------------------------------  Indications: Left arm edema, cellulitis vs DVT. Performing Technologist: Edilia Elden Appl  Examination Guidelines: A complete evaluation includes B-mode imaging, spectral Doppler, color Doppler, and power Doppler as needed of all accessible portions of each vessel. Bilateral testing is considered an integral part of a complete examination. Limited examinations for reoccurring indications may be performed as noted.  Right Findings: +----------+------------+---------+-----------+----------+-------+ RIGHT     CompressiblePhasicitySpontaneousPropertiesSummary +----------+------------+---------+-----------+----------+-------+ IJV           Full       Yes       Yes                      +----------+------------+---------+-----------+----------+-------+  Subclavian               Yes       Yes                      +----------+------------+---------+-----------+----------+-------+  Left Findings: +----------+------------+---------+-----------+----------+-------+ LEFT      CompressiblePhasicitySpontaneousPropertiesSummary +----------+------------+---------+-----------+----------+-------+ IJV           Full       Yes       Yes                      +----------+------------+---------+-----------+----------+-------+ Subclavian    Full       Yes       Yes                      +----------+------------+---------+-----------+----------+-------+ Axillary      Full       Yes       Yes                      +----------+------------+---------+-----------+----------+-------+ Brachial      Full       Yes       Yes              Limited +----------+------------+---------+-----------+----------+-------+ Radial        Full                                           +----------+------------+---------+-----------+----------+-------+ Ulnar         Full                                          +----------+------------+---------+-----------+----------+-------+ Cephalic      Full       Yes       Yes                      +----------+------------+---------+-----------+----------+-------+ Basilic                                               AVF   +----------+------------+---------+-----------+----------+-------+ Limited exam due to patient's pain and excessive swelling. Patent fistula. Occluded graft.  Summary:  Right: No evidence of thrombosis in the subclavian.  Left: No evidence of deep vein thrombosis in the upper extremity. No evidence of superficial vein thrombosis in the upper extremity.  *See table(s) above for measurements and observations.  Diagnosing physician: Lonni Gaskins MD Electronically signed by Lonni Gaskins MD on 09/13/2024 at 8:13:03 PM.    Final     ROS: All others negative except those listed in HPI.   Physical Exam: Vitals:   09/14/24 1009 09/14/24 1014 09/14/24 1030 09/14/24 1356  BP:   (!) 124/90 (!) 120/93  Pulse:  77 75 65  Resp: 17 15 (!) 22 18  Temp:    98.2 F (36.8 C)  TempSrc:    Oral  SpO2:  (!) 72% 91% 100%  Weight:      Height:         General: WDWN NAD Head: NCAT sclera not icteric Lungs: Diminished at bases, clear in uppers.  No wheeze, rales or rhonchi. Breathing is unlabored. Heart: RRR. No murmur, rubs or gallops.  Abdomen: soft and non-tender Lower extremities: (+) edema LUE-wrapped; No LE edema Neuro: AAOx3. Moves all extremities spontaneously. Dialysis Access: TDC; L AVF (malfunctioned)  Dialysis Orders:  MWF - [facility] 3.5hrs, BFR 400, DFR 500,  EDW 68.4kg, 2K/ 2Ca Heparin  3000 units bolus with HD Mircera 225 mcg q2wks - last 09/11/24 Hectorol  5mcg IV qHD - last 09/13/24 Sensipar  90mg  with HD - last 09/13/24  Last Labs: Hgb 8.3, K 3.9, Ca 8.2, Alb  3.3  Assessment/Plan: AVF malfunction - [facility]VS following. Hx multiple revisions in the past. Most recent revision 1/7. Scheduled ligation aborted today 2nd pt c/o CP. Ligation re-scheduled for tomorrow. NPO after midnight.  Chest Pain - Troponins elevated, Cardiology consulted ESRD -  on HD MWF. Not reaching EDW and signs off early in outpatient. Given CP and volume overload, plan for HD today. Will assess tomorrow to determine further HD needs. Hypertension/volume  - Bps on lower end for her. Discussed with primary. BP meds on hold for now. HD today. More likely will need to adjust BP medications once she gets dialysis. Anemia of CKD - ESA not due yet. Last given 1/12 in outpatient Secondary Hyperparathyroidism -  Checking phos in AM Nutrition - Renal diet with fluid restriction  Charmaine Piety, NP [facility] 09/14/2024, 3:24 PM      [1]  Allergies Allergen Reactions   Iodine  Hives and Shortness Of Breath   Lisinopril  Anaphylaxis    Angioedema   Gabapentin Other (See Comments)    Reaction: Tremor (intolerance); tremor   Nsaids Other (See Comments)    ESRD    Ativan  [Lorazepam ] Other (See Comments)    Somnolent with 1mg  ativan  at [facility] Nov 2019. Patient stated it made her feel like she shut down and was mute and non-responding.   Hydroxychloroquine  Other (See Comments)    Pt reports making her skin peel really bad   Vicodin [Hydrocodone -Acetaminophen ] Itching and Rash

## 2024-09-14 NOTE — Progress Notes (Signed)
" ° °  Brief Progress Note   _____________________________________________________________________________________________________________  Patient Name: Linda [name] Patient DOB: 1996/05/27   Data: Patient surgery was cancelled d/t chest pain and elevated troponin's. Needed to be admitted to the floor and moved from short stay.    Action: Reached out to physician     Response:  Patient will be moved to 52M.  _____________________________________________________________________________________________________________  [facility] RN Expeditor Rexene LITTIE Kirks Please contact us  directly via secure chat (search for [facility]) or by calling us  at [phone] [facility]).  "

## 2024-09-14 NOTE — Progress Notes (Signed)
 Vascular surgery progress note  Patient came to preop having active chest pain.  Troponins mildly elevated to 118 and recheck was 112. Cardiology was called although she has not been seen yet.  She is still having mild to moderate chest pain and nausea.  She will be canceled for today and reposted for tomorrow. Okay for regular diet today, n.p.o. midnight for tomorrow   Norman GORMAN Serve MD [facility] Phone Number: [phone] 09/14/2024 11:42 AM

## 2024-09-14 NOTE — Progress Notes (Addendum)
 "          [facility]                                                                              Linda [name], is a 29 y.o. female, DOB - 08-05-1996, FMW:[id number] Admit date - 09/13/2024    Outpatient Primary MD for the patient is [name], NP  LOS - 0  days  Chief Complaint  Patient presents with   Post-op Problem   Arm Pain   Emesis   Nausea   knots in my thighs       Brief summary   Patient is a 29 year old female with hypertension, G6PD deficiency, CHF, lupus, ESRD on HD, MWF schedule, history of multiple AV fistula revisions presented with progressively worsening left arm swelling and pain since after recent AV fistula surgery on 09/06/2024 (Dr Serene).  She noted wound dehiscence about 3 days ago prior to admission.  No fever or chills, nausea vomiting or abdominal pain.  Patient is receiving dialysis via right IJ TDC. Vascular surgery was consulted.  Assessment & Plan     Left upper extremity edema and pain with ongoing drainage from incision after recent AV fistula surgery - Vascular surgery consulted, n.p.o., plan for ligation of AV fistula after cardiology evaluation.    Chest pain with mildly elevated troponin -Was called by Dr Darlyn, anesthesiology regarding + troponin.   -Seen the patient in short stay, patient reported having chest pain since 5 AM this morning, currently constant and states radiating to left arm. (Currently LUE pain from AV fistula issue as well.  Mild shortness of breath.  She states she typically gets intermittent chest pain, usually resolves spontaneously.  Currently constant, 5/10, describing as  pain after I drink soda super fast. -Troponin x 118, waiting for second set.  EKG showed no acute ST-T wave changes - Cardiology consulted.  Last 2D echo 03/06/2024 showed EF of 30 to 35% with global hypokinesis, normal RV SF Addendum  Repeat troponin 112 Requested cardiology to eval / clearance for surgery today   ESRD  on HD MWF -Currently receiving dialysis treatments via right IJ [facility], last HD on 1/14 - Nephrology consulted.     Chronic HFrEF -2D echo 02/2024 showed EF 30 to 35%, moderate biatrial dilation, mild mitral regurgitation, and moderate tricuspid regurgitation.   - volume management with HD.    Hypertension -BP stable, patient on multiple antihypertensives including amlodipine , Coreg , clonidine , doxazosin ,  furosemide , and irbesartan .     GERD Continue PPI and sucralfate .   Anemia of chronic disease H&H stable, baseline 9-10   Estimated body mass index is 25.82 kg/m as calculated from the following:   Height as of this encounter: 5' 6 (1.676 m).   Weight as of this encounter: 72.6 kg.  Code Status: Full code DVT Prophylaxis:  SCDs Start: 09/13/24 2127   Level of Care: Level of care: Telemetry Family Communication: Updated patient Disposition Plan:      Remains inpatient appropriate:      Procedures:    Consultants:   Nephrology Vascular surgery Cardiology  Antimicrobials:   Anti-infectives (From admission, onward)    None  Medications  acetaminophen        acetaminophen   1,000 mg Oral Once   [MAR Hold] amLODipine   10 mg Oral QHS   [MAR Hold] carvedilol   25 mg Oral BID WC   chlorhexidine   15 mL Mouth/Throat Once   Or   mouth rinse  15 mL Mouth Rinse Once   [MAR Hold] cloNIDine   0.1 mg Oral Daily   [MAR Hold] doxazosin   2 mg Oral Daily   [MAR Hold] furosemide   80 mg Oral Daily   [MAR Hold] irbesartan   300 mg Oral Daily   [MAR Hold] pantoprazole   40 mg Oral Daily   [MAR Hold] sucralfate   1 g Oral TID WC & HS      Subjective:   Linda [name] was seen and examined today.  Seen in the short stay preop.  Complaining of chest pain, 5/10, constant, mild shortness of breath with radiation to left arm.  No acute nausea vomiting, abdominal pain any fevers.   Objective:   Vitals:   09/14/24 0834 09/14/24 0839 09/14/24 0844 09/14/24 0849  BP:       Pulse: 74 74  76  Resp: (!) 0 (!) 8 (!) 8 (!) 23  Temp:      TempSrc:      SpO2: 97% 98%  99%  Weight:      Height:       No intake or output data in the 24 hours ending 09/14/24 0928   Wt Readings from Last 3 Encounters:  09/14/24 72.6 kg  09/06/24 70.3 kg  07/31/24 73.9 kg     Exam General: Alert and oriented x 3, NAD Cardiovascular: S1 S2 auscultated,  RRR Respiratory: Clear to auscultation bilaterally, no wheezing.  No chest wall tenderness. Gastrointestinal: Soft, nontender, nondistended, + bowel sounds Ext: no pedal edema bilaterally Neuro: No new deficits Skin: LUE dressing intact Psych: Normal affect     Data Reviewed:  I have personally reviewed following labs    CBC Lab Results  Component Value Date   WBC 7.8 09/14/2024   RBC 2.96 (L) 09/14/2024   HGB 8.3 (L) 09/14/2024   HCT 28.0 (L) 09/14/2024   MCV 94.6 09/14/2024   MCH 28.0 09/14/2024   PLT 289 09/14/2024   MCHC 29.6 (L) 09/14/2024   RDW 22.3 (H) 09/14/2024   LYMPHSABS 2.0 09/13/2024   MONOABS 1.2 (H) 09/13/2024   EOSABS 0.1 09/13/2024   BASOSABS 0.0 09/13/2024     Last metabolic panel Lab Results  Component Value Date   NA 134 (L) 09/14/2024   K 3.9 09/14/2024   CL 95 (L) 09/14/2024   CO2 27 09/14/2024   BUN 27 (H) 09/14/2024   CREATININE 6.35 (H) 09/14/2024   GLUCOSE 100 (H) 09/14/2024   GFRNONAA 8 (L) 09/14/2024   GFRAA 19 (L) 03/27/2019   CALCIUM  8.2 (L) 09/14/2024   PHOS 9.1 (H) 05/31/2024   PROT 7.9 09/13/2024   ALBUMIN  3.3 09/13/2024   BILITOT 1.1 09/13/2024   ALKPHOS 174 09/13/2024   AST 26 09/13/2024   ALT 7 09/13/2024   ANIONGAP 13 09/14/2024    CBG (last 3)  No results for input(s): GLUCAP in the last 72 hours.    Coagulation Profile: No results for input(s): INR, PROTIME in the last 168 hours.   Radiology Studies: I have personally reviewed the imaging studies  UE Venous Duplex ([facility] and [facility] ONLY) Result Date: 09/13/2024 UPPER VENOUS STUDY  Patient  Name:  Linda [name]  Date of Exam:  09/13/2024 Medical Rec #: [id number]              Accession #:    [id number] Date of Birth: 1995/10/27              Patient Gender: F Patient Age:   13 years Exam Location:  [facility] Procedure:      VAS US  UPPER EXTREMITY VENOUS DUPLEX Referring Phys: ALEXIS YOUNG --------------------------------------------------------------------------------  Indications: Left arm edema, cellulitis vs DVT. Performing Technologist: Edilia Elden Appl  Examination Guidelines: A complete evaluation includes B-mode imaging, spectral Doppler, color Doppler, and power Doppler as needed of all accessible portions of each vessel. Bilateral testing is considered an integral part of a complete examination. Limited examinations for reoccurring indications may be performed as noted.  Right Findings: +----------+------------+---------+-----------+----------+-------+ RIGHT     CompressiblePhasicitySpontaneousPropertiesSummary +----------+------------+---------+-----------+----------+-------+ IJV           Full       Yes       Yes                      +----------+------------+---------+-----------+----------+-------+ Subclavian               Yes       Yes                      +----------+------------+---------+-----------+----------+-------+  Left Findings: +----------+------------+---------+-----------+----------+-------+ LEFT      CompressiblePhasicitySpontaneousPropertiesSummary +----------+------------+---------+-----------+----------+-------+ IJV           Full       Yes       Yes                      +----------+------------+---------+-----------+----------+-------+ Subclavian    Full       Yes       Yes                      +----------+------------+---------+-----------+----------+-------+ Axillary      Full       Yes       Yes                      +----------+------------+---------+-----------+----------+-------+ Brachial      Full        Yes       Yes              Limited +----------+------------+---------+-----------+----------+-------+ Radial        Full                                          +----------+------------+---------+-----------+----------+-------+ Ulnar         Full                                          +----------+------------+---------+-----------+----------+-------+ Cephalic      Full       Yes       Yes                      +----------+------------+---------+-----------+----------+-------+ Basilic  AVF   +----------+------------+---------+-----------+----------+-------+ Limited exam due to patient's pain and excessive swelling. Patent fistula. Occluded graft.  Summary:  Right: No evidence of thrombosis in the subclavian.  Left: No evidence of deep vein thrombosis in the upper extremity. No evidence of superficial vein thrombosis in the upper extremity.  *See table(s) above for measurements and observations.  Diagnosing physician: Lonni Gaskins MD Electronically signed by Lonni Gaskins MD on 09/13/2024 at 8:13:03 PM.    Final        Nydia Distance M.D. [facility] 09/14/2024, 9:28 AM  Available via Epic secure chat 7am-7pm After 7 pm, please refer to night coverage provider listed on amion.    "

## 2024-09-14 NOTE — Progress Notes (Signed)
 Cards Master paged and made aware of consult. Rollo stated second troponin would be drawn at 0930 and someone would be up to see her after that lab value had resulted.

## 2024-09-14 NOTE — Progress Notes (Signed)
 CRITICAL RESULT PROVIDER NOTIFICATION  Test performed and critical result:  troponin 118  Date and time result received:  09/14/24; 08:09 o'clock  Provider name/title: Dr. Darlyn  Date and time provider notified: 09/14/24; 08:09 o'clock  Date and time provider responded: 01/015/26 - 08:10 o'clock  Provider response:In department

## 2024-09-14 NOTE — Progress Notes (Signed)
 Patient transferred to 5M17. Report given to receiving nurse Precious RN.

## 2024-09-15 ENCOUNTER — Encounter (HOSPITAL_COMMUNITY): Payer: Self-pay | Admitting: Internal Medicine

## 2024-09-15 ENCOUNTER — Observation Stay (HOSPITAL_COMMUNITY): Admitting: Anesthesiology

## 2024-09-15 ENCOUNTER — Encounter (HOSPITAL_COMMUNITY): Admission: EM | Payer: Self-pay | Source: Home / Self Care | Attending: Family Medicine

## 2024-09-15 DIAGNOSIS — I5020 Unspecified systolic (congestive) heart failure: Secondary | ICD-10-CM

## 2024-09-15 DIAGNOSIS — M7989 Other specified soft tissue disorders: Secondary | ICD-10-CM | POA: Diagnosis not present

## 2024-09-15 DIAGNOSIS — I420 Dilated cardiomyopathy: Secondary | ICD-10-CM

## 2024-09-15 DIAGNOSIS — I132 Hypertensive heart and chronic kidney disease with heart failure and with stage 5 chronic kidney disease, or end stage renal disease: Secondary | ICD-10-CM | POA: Diagnosis not present

## 2024-09-15 DIAGNOSIS — R079 Chest pain, unspecified: Secondary | ICD-10-CM | POA: Diagnosis not present

## 2024-09-15 DIAGNOSIS — Z992 Dependence on renal dialysis: Secondary | ICD-10-CM | POA: Diagnosis not present

## 2024-09-15 DIAGNOSIS — N186 End stage renal disease: Secondary | ICD-10-CM | POA: Diagnosis not present

## 2024-09-15 DIAGNOSIS — T82897A Other specified complication of cardiac prosthetic devices, implants and grafts, initial encounter: Secondary | ICD-10-CM | POA: Diagnosis not present

## 2024-09-15 DIAGNOSIS — F17210 Nicotine dependence, cigarettes, uncomplicated: Secondary | ICD-10-CM | POA: Diagnosis not present

## 2024-09-15 DIAGNOSIS — I10 Essential (primary) hypertension: Secondary | ICD-10-CM | POA: Diagnosis not present

## 2024-09-15 HISTORY — PX: INCISION AND DRAINAGE OF WOUND: SHX1803

## 2024-09-15 HISTORY — PX: APPLICATION OF WOUND VAC: SHX5189

## 2024-09-15 HISTORY — PX: LIGATION OF ARTERIOVENOUS  FISTULA: SHX5948

## 2024-09-15 HISTORY — PX: APPLICATION, SKIN SUBSTITUTE: SHX7530

## 2024-09-15 LAB — POCT I-STAT, CHEM 8
BUN: 24 mg/dL — ABNORMAL HIGH (ref 6–20)
Calcium, Ion: 0.95 mmol/L — ABNORMAL LOW (ref 1.15–1.40)
Chloride: 93 mmol/L — ABNORMAL LOW (ref 98–111)
Creatinine, Ser: 5.3 mg/dL — ABNORMAL HIGH (ref 0.44–1.00)
Glucose, Bld: 101 mg/dL — ABNORMAL HIGH (ref 70–99)
HCT: 31 % — ABNORMAL LOW (ref 36.0–46.0)
Hemoglobin: 10.5 g/dL — ABNORMAL LOW (ref 12.0–15.0)
Potassium: 3.8 mmol/L (ref 3.5–5.1)
Sodium: 131 mmol/L — ABNORMAL LOW (ref 135–145)
TCO2: 27 mmol/L (ref 22–32)

## 2024-09-15 LAB — SURGICAL PCR SCREEN
MRSA, PCR: NEGATIVE
Staphylococcus aureus: POSITIVE — AB

## 2024-09-15 LAB — HCG, SERUM, QUALITATIVE: Preg, Serum: NEGATIVE

## 2024-09-15 LAB — HEPATITIS B SURFACE ANTIBODY, QUANTITATIVE: Hep B S AB Quant (Post): 9.8 m[IU]/mL — ABNORMAL LOW

## 2024-09-15 MED ORDER — ONDANSETRON HCL 4 MG/2ML IJ SOLN
INTRAMUSCULAR | Status: AC
  Filled 2024-09-15: qty 2

## 2024-09-15 MED ORDER — CHLORHEXIDINE GLUCONATE 0.12 % MT SOLN
OROMUCOSAL | Status: AC
  Administered 2024-09-15: 15 mL via OROMUCOSAL
  Filled 2024-09-15: qty 15

## 2024-09-15 MED ORDER — PROPOFOL 10 MG/ML IV BOLUS
INTRAVENOUS | Status: AC
  Filled 2024-09-15: qty 20

## 2024-09-15 MED ORDER — LIDOCAINE 2% (20 MG/ML) 5 ML SYRINGE
INTRAMUSCULAR | Status: AC
  Filled 2024-09-15: qty 5

## 2024-09-15 MED ORDER — FENTANYL CITRATE (PF) 100 MCG/2ML IJ SOLN
INTRAMUSCULAR | Status: AC
  Filled 2024-09-15: qty 2

## 2024-09-15 MED ORDER — HEPARIN 6000 UNIT IRRIGATION SOLUTION
Status: DC | PRN
  Administered 2024-09-15: 1

## 2024-09-15 MED ORDER — PROPOFOL 500 MG/50ML IV EMUL
INTRAVENOUS | Status: DC | PRN
  Administered 2024-09-15: 100 ug/kg/min via INTRAVENOUS

## 2024-09-15 MED ORDER — CHLORHEXIDINE GLUCONATE 0.12 % MT SOLN: 15.0000 mL | Freq: Once | OROMUCOSAL | Status: AC

## 2024-09-15 MED ORDER — VASHE WOUND IRRIGATION OPTIME
TOPICAL | Status: DC | PRN
  Administered 2024-09-15: 34 [oz_av]

## 2024-09-15 MED ORDER — AMISULPRIDE (ANTIEMETIC) 5 MG/2ML IV SOLN: 5.0000 mg | Freq: Once | INTRAVENOUS | Status: DC | PRN

## 2024-09-15 MED ORDER — FENTANYL CITRATE (PF) 100 MCG/2ML IJ SOLN: 25.0000 ug | INTRAMUSCULAR | Status: DC | PRN

## 2024-09-15 MED ORDER — SODIUM CHLORIDE 0.9 % IV SOLN: INTRAVENOUS | Status: DC

## 2024-09-15 MED ORDER — PROPOFOL 10 MG/ML IV BOLUS
INTRAVENOUS | Status: DC | PRN
  Administered 2024-09-15 (×4): 10 mg via INTRAVENOUS

## 2024-09-15 MED ORDER — HEPARIN 6000 UNIT IRRIGATION SOLUTION
Status: AC
  Filled 2024-09-15: qty 500

## 2024-09-15 MED ORDER — CEFAZOLIN SODIUM-DEXTROSE 2-3 GM-%(50ML) IV SOLR
INTRAVENOUS | Status: DC | PRN
  Administered 2024-09-15: 2 g via INTRAVENOUS

## 2024-09-15 MED ORDER — MIDAZOLAM HCL (PF) 2 MG/2ML IJ SOLN
INTRAMUSCULAR | Status: DC | PRN
  Administered 2024-09-15: .5 mg via INTRAVENOUS

## 2024-09-15 MED ORDER — FENTANYL CITRATE (PF) 100 MCG/2ML IJ SOLN
INTRAMUSCULAR | Status: DC | PRN
  Administered 2024-09-15 (×2): 25 ug via INTRAVENOUS
  Administered 2024-09-15: 50 ug via INTRAVENOUS

## 2024-09-15 MED ORDER — ORAL CARE MOUTH RINSE: 15.0000 mL | Freq: Once | OROMUCOSAL | Status: AC

## 2024-09-15 MED ORDER — PHENYLEPHRINE HCL-NACL 20-0.9 MG/250ML-% IV SOLN
INTRAVENOUS | Status: DC | PRN
  Administered 2024-09-15: 30 ug/min via INTRAVENOUS

## 2024-09-15 MED ORDER — CARVEDILOL 6.25 MG PO TABS
6.2500 mg | ORAL_TABLET | Freq: Two times a day (BID) | ORAL | Status: DC
  Administered 2024-09-15: 6.25 mg via ORAL
  Filled 2024-09-15: qty 1

## 2024-09-15 MED ORDER — MIDAZOLAM HCL 2 MG/2ML IJ SOLN
INTRAMUSCULAR | Status: AC
  Filled 2024-09-15: qty 2

## 2024-09-15 MED ORDER — CEFAZOLIN SODIUM 1 G IJ SOLR
INTRAMUSCULAR | Status: AC
  Filled 2024-09-15: qty 20

## 2024-09-15 MED ORDER — LIDOCAINE 2% (20 MG/ML) 5 ML SYRINGE
INTRAMUSCULAR | Status: DC | PRN
  Administered 2024-09-15: 70 mg via INTRAVENOUS

## 2024-09-15 MED ORDER — HYDROMORPHONE HCL 1 MG/ML IJ SOLN
INTRAMUSCULAR | Status: AC
  Filled 2024-09-15: qty 1

## 2024-09-15 MED ORDER — 0.9 % SODIUM CHLORIDE (POUR BTL) OPTIME
TOPICAL | Status: DC | PRN
  Administered 2024-09-15: 1000 mL

## 2024-09-15 NOTE — Progress Notes (Signed)
" °  Daily Progress Note  S/p: Brachiobasilic fistula creation  Subjective: Complaining of left arm swelling.  No chest pain  Objective: Vitals:   09/15/24 0808 09/15/24 0835  BP: (!) 126/90 120/89  Pulse: 86 83  Resp:  20  Temp: 98 F (36.7 C) 97.9 F (36.6 C)  SpO2: 92% 92%    Physical Examination Nonlabored breathing, regular rate Left upper extremity with significant edema  ASSESSMENT/PLAN:  Patient with left upper extremity edema from central stenosis versus occlusion.  Patient will benefit from fistula ligation.  After discussing risks and benefits, she elected to proceed.     Fonda FORBES Rim MD MS [facility] [phone] 09/15/2024  9:14 AM  "

## 2024-09-15 NOTE — Progress Notes (Signed)
 "          [facility]                                                                              Teaghan Melrose, is a 29 y.o. female, DOB - 1995-10-24, FMW:[id number] Admit date - 09/13/2024    Outpatient Primary MD for the patient is [name], NP  LOS - 1  days  Chief Complaint  Patient presents with   Post-op Problem   Arm Pain   Emesis   Nausea   knots in my thighs       Brief summary   Patient is a 29 year old female with hypertension, G6PD deficiency, CHF, lupus, ESRD on HD, MWF schedule, history of multiple AV fistula revisions presented with progressively worsening left arm swelling and pain since after recent AV fistula surgery on 09/06/2024 (Dr Serene).  She noted wound dehiscence about 3 days ago prior to admission.  No fever or chills, nausea vomiting or abdominal pain.  Patient is receiving dialysis via right IJ TDC. Vascular surgery was consulted.  Assessment & Plan     Left upper extremity edema and pain with ongoing drainage from incision after recent AV fistula surgery - Underwent vascular surgery today, left arm placed acute basilic fistula ligation, incision and debridement - Per vascular surgery, plan to return to the OR on Tuesday, 09/19/2024 for possible closure  Chest pain with mildly elevated troponin -Resolved.  Last 2D echo 03/06/2024 showed EF of 30 to 35% with global hypokinesis, normal RV SF - Seen by cardiology, chest pain not anginal, troponins flat echocardiogram   ESRD on HD MWF -Currently receiving dialysis treatments via right IJ [facility], last HD on 1/14 - Nephrology following, received HD yesterday on 1/15  Chronic HFrEF -2D echo 02/2024 showed EF 30 to 35%, moderate biatrial dilation, mild mitral regurgitation, and moderate tricuspid regurgitation.   - volume management with HD.    Hypertension - Antihypertensives on hold due to lower BP. - Discontinued Cardura , decreased Coreg  to 6.25 mg twice daily,  - On hemodialysis,  likely does not need Lasix .  Nephrology following.  GERD Continue PPI and sucralfate .   Anemia of chronic disease H&H stable, baseline 9-10 - Hemoglobin 10.5, will follow labs in a.m.   Estimated body mass index is 26.37 kg/m as calculated from the following:   Height as of this encounter: 5' 6 (1.676 m).   Weight as of this encounter: 74.1 kg.  Code Status: Full code DVT Prophylaxis:  SCDs Start: 09/13/24 2127   Level of Care: Level of care: Telemetry Family Communication: Updated patient Disposition Plan:      Remains inpatient appropriate:      Procedures:  Left arm brachiobasilic fistula ligation Incision and debridement 38 cm Kerecis biologic substitute placement Vacuum-assisted dressing  Consultants:   Nephrology Vascular surgery Cardiology  Antimicrobials:   Anti-infectives (From admission, onward)    None          Medications  amLODipine   10 mg Oral QHS   carvedilol   25 mg Oral BID WC   Chlorhexidine  Gluconate Cloth  6 each Topical Q0600   cloNIDine   0.1 mg Oral Daily   doxazosin   2 mg Oral Daily   furosemide   80 mg Oral Daily   irbesartan   300 mg Oral Daily   mupirocin  ointment  1 Application Nasal BID   pantoprazole   40 mg Oral Daily   sucralfate   1 g Oral TID WC & HS      Subjective:   Linda [name] was seen and examined today.  Seen before the surgery this morning.  Feeling a lot better, no significant chest pain.  Left arm significant swelling.  No acute nausea vomiting, abdominal pain any fevers.   Objective:   Vitals:   09/15/24 1100 09/15/24 1115 09/15/24 1119 09/15/24 1142  BP: 107/89 (!) 128/92  (!) 133/96  Pulse: 80 75 76 78  Resp: 17 19 19 18   Temp:   98 F (36.7 C) 98.2 F (36.8 C)  TempSrc:      SpO2: 90% 100% 100% 100%  Weight:      Height:        Intake/Output Summary (Last 24 hours) at 09/15/2024 1214 Last data filed at 09/15/2024 1100 Gross per 24 hour  Intake 490 ml  Output 4000 ml  Net -3510 ml      Wt Readings from Last 3 Encounters:  09/15/24 74.1 kg  09/06/24 70.3 kg  07/31/24 73.9 kg   Physical Exam General: Alert and oriented x 3, NAD Cardiovascular: S1 S2 clear, RRR.  Respiratory: CTAB, no wheezing Gastrointestinal: Soft, nontender, nondistended, NBS Ext: no pedal edema bilaterally Neuro: no new deficits Skin: left arm edema++ Psych: Normal affect      Data Reviewed:  I have personally reviewed following labs    CBC Lab Results  Component Value Date   WBC 7.8 09/14/2024   RBC 2.96 (L) 09/14/2024   HGB 10.5 (L) 09/15/2024   HCT 31.0 (L) 09/15/2024   MCV 94.6 09/14/2024   MCH 28.0 09/14/2024   PLT 289 09/14/2024   MCHC 29.6 (L) 09/14/2024   RDW 22.3 (H) 09/14/2024   LYMPHSABS 2.0 09/13/2024   MONOABS 1.2 (H) 09/13/2024   EOSABS 0.1 09/13/2024   BASOSABS 0.0 09/13/2024     Last metabolic panel Lab Results  Component Value Date   NA 131 (L) 09/15/2024   K 3.8 09/15/2024   CL 93 (L) 09/15/2024   CO2 27 09/14/2024   BUN 24 (H) 09/15/2024   CREATININE 5.30 (H) 09/15/2024   GLUCOSE 101 (H) 09/15/2024   GFRNONAA 8 (L) 09/14/2024   GFRAA 19 (L) 03/27/2019   CALCIUM  8.2 (L) 09/14/2024   PHOS 9.1 (H) 05/31/2024   PROT 7.9 09/13/2024   ALBUMIN  3.3 09/13/2024   BILITOT 1.1 09/13/2024   ALKPHOS 174 09/13/2024   AST 26 09/13/2024   ALT 7 09/13/2024   ANIONGAP 13 09/14/2024    CBG (last 3)  No results for input(s): GLUCAP in the last 72 hours.    Coagulation Profile: No results for input(s): INR, PROTIME in the last 168 hours.   Radiology Studies: I have personally reviewed the imaging studies  DG CHEST PORT 1 VIEW Result Date: 09/14/2024 EXAM: 1 VIEW XRAY OF THE CHEST 09/14/2024 01:35:00 PM COMPARISON: 09/08/2024 CLINICAL HISTORY: CHF (congestive heart failure) (HCC) FINDINGS: LINES, TUBES AND DEVICES: Right IJ CVC in place with tip overlying right atrium. LUNGS AND PLEURA: Low lung volumes. Bilateral small pleural effusions.  Interstitial prominence. Patchy left basilar opacities. No pneumothorax. HEART AND MEDIASTINUM: Cardiomegaly, unchanged. BONES AND SOFT TISSUES: No acute osseous abnormality. IMPRESSION: 1. Low lung volumes with bilateral small pleural effusions, interstitial  edema, and patchy left basilar opacities. 2. Cardiomegaly, unchanged. 3. Right IJ CVC tip overlies the right atrium. Electronically signed by: Waddell Calk MD 09/14/2024 03:50 PM EST RP Workstation: [id number]   UE Venous Duplex ([facility] and [facility] ONLY) Result Date: 09/13/2024 UPPER VENOUS STUDY  Patient Name:  Linda [name]  Date of Exam:   09/13/2024 Medical Rec #: [id number]              Accession #:    [id number] Date of Birth: Aug 15, 1996              Patient Gender: F Patient Age:   40 years Exam Location:  [facility] Procedure:      VAS US  UPPER EXTREMITY VENOUS DUPLEX Referring Phys: ALEXIS YOUNG --------------------------------------------------------------------------------  Indications: Left arm edema, cellulitis vs DVT. Performing Technologist: Edilia Elden Appl  Examination Guidelines: A complete evaluation includes B-mode imaging, spectral Doppler, color Doppler, and power Doppler as needed of all accessible portions of each vessel. Bilateral testing is considered an integral part of a complete examination. Limited examinations for reoccurring indications may be performed as noted.  Right Findings: +----------+------------+---------+-----------+----------+-------+ RIGHT     CompressiblePhasicitySpontaneousPropertiesSummary +----------+------------+---------+-----------+----------+-------+ IJV           Full       Yes       Yes                      +----------+------------+---------+-----------+----------+-------+ Subclavian               Yes       Yes                      +----------+------------+---------+-----------+----------+-------+  Left Findings:  +----------+------------+---------+-----------+----------+-------+ LEFT      CompressiblePhasicitySpontaneousPropertiesSummary +----------+------------+---------+-----------+----------+-------+ IJV           Full       Yes       Yes                      +----------+------------+---------+-----------+----------+-------+ Subclavian    Full       Yes       Yes                      +----------+------------+---------+-----------+----------+-------+ Axillary      Full       Yes       Yes                      +----------+------------+---------+-----------+----------+-------+ Brachial      Full       Yes       Yes              Limited +----------+------------+---------+-----------+----------+-------+ Radial        Full                                          +----------+------------+---------+-----------+----------+-------+ Ulnar         Full                                          +----------+------------+---------+-----------+----------+-------+ Cephalic      Full       Yes       Yes                      +----------+------------+---------+-----------+----------+-------+  Basilic                                               AVF   +----------+------------+---------+-----------+----------+-------+ Limited exam due to patient's pain and excessive swelling. Patent fistula. Occluded graft.  Summary:  Right: No evidence of thrombosis in the subclavian.  Left: No evidence of deep vein thrombosis in the upper extremity. No evidence of superficial vein thrombosis in the upper extremity.  *See table(s) above for measurements and observations.  Diagnosing physician: Lonni Gaskins MD Electronically signed by Lonni Gaskins MD on 09/13/2024 at 8:13:03 PM.    Final        Linda [name] M.D. [facility] 09/15/2024, 12:14 PM  Available via Epic secure chat 7am-7pm After 7 pm, please refer to night coverage provider listed on amion.    "

## 2024-09-15 NOTE — Progress Notes (Addendum)
 "  Cardiologist:  Wonda  Subjective:  SSCP much improved post additional dialysis yesterday  For OR this am   Objective:  Vitals:   09/14/24 2300 09/15/24 0434 09/15/24 0808 09/15/24 0835  BP: (!) 134/93 (!) 121/93 (!) 126/90 120/89  Pulse:  91 86 83  Resp:  18  20  Temp:  98.6 F (37 C) 98 F (36.7 C) 97.9 F (36.6 C)  TempSrc:   Oral Oral  SpO2:  91% 92% 92%  Weight:    74.1 kg  Height:    5' 6 (1.676 m)    Intake/Output from previous day:  Intake/Output Summary (Last 24 hours) at 09/15/2024 0902 Last data filed at 09/15/2024 0558 Gross per 24 hour  Intake 480 ml  Output 4000 ml  Net -3520 ml    Physical Exam:  Chronically ill black female Rales basilar crackles improved  SEM flow murmur from fistual Abdomen benign Severe LUE swelling with fistula wrapped She does Have a left radial pulse Dialysis catheter right subclavian Plus 2 LE edema bilateral   Lab Results: Basic Metabolic Panel: Recent Labs    09/13/24 1454 09/14/24 0411 09/15/24 0850  NA 134 134* 131*  K 4.1 3.9 3.8  CL 93 95* 93*  CO2 25 27  --   GLUCOSE 82 100* 101*  BUN 22 27* 24*  CREATININE 5.56 6.35* 5.30*  CALCIUM  9.0 8.2*  --    Liver Function Tests: Recent Labs    09/13/24 1454  AST 26  ALT 7  ALKPHOS 174  BILITOT 1.1  PROT 7.9  ALBUMIN  3.3   No results for input(s): LIPASE, AMYLASE in the last 72 hours. CBC: Recent Labs    09/13/24 1454 09/14/24 0411 09/15/24 0850  WBC 9.2 7.8  --   NEUTROABS 5.6  --   --   HGB 9.0* 8.3* 10.5*  HCT 29.4* 28.0* 31.0*  MCV 92.7 94.6  --   PLT 292 289  --      Imaging: DG CHEST PORT 1 VIEW Result Date: 09/14/2024 EXAM: 1 VIEW XRAY OF THE CHEST 09/14/2024 01:35:00 PM COMPARISON: 09/08/2024 CLINICAL HISTORY: CHF (congestive heart failure) (HCC) FINDINGS: LINES, TUBES AND DEVICES: Right IJ CVC in place with tip overlying right atrium. LUNGS AND PLEURA: Low lung volumes. Bilateral small pleural effusions. Interstitial  prominence. Patchy left basilar opacities. No pneumothorax. HEART AND MEDIASTINUM: Cardiomegaly, unchanged. BONES AND SOFT TISSUES: No acute osseous abnormality. IMPRESSION: 1. Low lung volumes with bilateral small pleural effusions, interstitial edema, and patchy left basilar opacities. 2. Cardiomegaly, unchanged. 3. Right IJ CVC tip overlies the right atrium. Electronically signed by: Waddell Calk MD 09/14/2024 03:50 PM EST RP Workstation: [id number]   UE Venous Duplex ([facility] and [facility] ONLY) Result Date: 09/13/2024 UPPER VENOUS STUDY  Patient Name:  Linda [name]  Date of Exam:   09/13/2024 Medical Rec #: [id number]              Accession #:    [id number] Date of Birth: Apr 09, 1996              Patient Gender: F Patient Age:   29 years Exam Location:  [facility] Procedure:      VAS US  UPPER EXTREMITY VENOUS DUPLEX Referring Phys: ALEXIS YOUNG --------------------------------------------------------------------------------  Indications: Left arm edema, cellulitis vs DVT. Performing Technologist: Edilia Elden Appl  Examination Guidelines: A complete evaluation includes B-mode imaging, spectral Doppler, color Doppler, and power Doppler as needed of all accessible portions of each vessel. Bilateral testing  is considered an integral part of a complete examination. Limited examinations for reoccurring indications may be performed as noted.  Right Findings: +----------+------------+---------+-----------+----------+-------+ RIGHT     CompressiblePhasicitySpontaneousPropertiesSummary +----------+------------+---------+-----------+----------+-------+ IJV           Full       Yes       Yes                      +----------+------------+---------+-----------+----------+-------+ Subclavian               Yes       Yes                      +----------+------------+---------+-----------+----------+-------+  Left Findings: +----------+------------+---------+-----------+----------+-------+ LEFT       CompressiblePhasicitySpontaneousPropertiesSummary +----------+------------+---------+-----------+----------+-------+ IJV           Full       Yes       Yes                      +----------+------------+---------+-----------+----------+-------+ Subclavian    Full       Yes       Yes                      +----------+------------+---------+-----------+----------+-------+ Axillary      Full       Yes       Yes                      +----------+------------+---------+-----------+----------+-------+ Brachial      Full       Yes       Yes              Limited +----------+------------+---------+-----------+----------+-------+ Radial        Full                                          +----------+------------+---------+-----------+----------+-------+ Ulnar         Full                                          +----------+------------+---------+-----------+----------+-------+ Cephalic      Full       Yes       Yes                      +----------+------------+---------+-----------+----------+-------+ Basilic                                               AVF   +----------+------------+---------+-----------+----------+-------+ Limited exam due to patient's pain and excessive swelling. Patent fistula. Occluded graft.  Summary:  Right: No evidence of thrombosis in the subclavian.  Left: No evidence of deep vein thrombosis in the upper extremity. No evidence of superficial vein thrombosis in the upper extremity.  *See table(s) above for measurements and observations.  Diagnosing physician: Lonni Gaskins MD Electronically signed by Lonni Gaskins MD on 09/13/2024 at 8:13:03 PM.    Final     Cardiac Studies:  ECG: SR rate 75 QT 446 normal ST segments    Telemetry:  NSR  Echo: 03/06/24 EF 30-35% mild MR   Medications:    [MAR Hold] amLODipine   10 mg Oral QHS   [MAR Hold] carvedilol   25 mg Oral BID WC   [MAR Hold] Chlorhexidine  Gluconate Cloth  6 each  Topical Q0600   [MAR Hold] cloNIDine   0.1 mg Oral Daily   [MAR Hold] doxazosin   2 mg Oral Daily   [MAR Hold] furosemide   80 mg Oral Daily   [MAR Hold] irbesartan   300 mg Oral Daily   [MAR Hold] mupirocin  ointment  1 Application Nasal BID   [MAR Hold] pantoprazole   40 mg Oral Daily   [MAR Hold] sucralfate   1 g Oral TID WC & HS      sodium chloride       Assessment/Plan:  Chest pain:  non anginal no trend in mildly elevated troponin in setting of CRF. No acute ECG changes. Has known non ischemic DCM with TTE 03/06/24  EF 30-35%  Patient indicates that morphine  works better than fentanyl  for pain  CHF:  despite echo showing improved EF on 09/12/23 it had fallen again 03/06/24 to 30-35% she is volume overloaded and in CHF. ? Diastolic component with severe LVH and CRF.  Imprvoed with dialysis yesterday Can lay flat Ok for OR  VVS:  she has temporary dialysis catheter in right subclavian. Consider LUE venous duplex to r/o thormbus. Needs fistula revision with pain and gross edema in LUE Radial pulse intact OR today CRF:  dialysis , K 3.8 Cr 5.3 has temp right subclavian line as LUE fistula being revised    Consider repeating TTE over weekend given fluctuations   Linda [name] 09/15/2024, 9:02 AM    "

## 2024-09-15 NOTE — Progress Notes (Addendum)
 Calling the floor RN for report. The night RN said that during the night patient received Nitroglycerin  SL x 2 for chest pain. Dr. Leopoldo was notified.

## 2024-09-15 NOTE — Progress Notes (Signed)
 " [name] Progress Note    Assessment/ Plan:   AVF malfunction - VVS following. Hx multiple revisions in the past. Most recent revision 1/7. Scheduled ligation aborted today 2nd pt c/o CP. S/p ligation 1/16 Chest Pain - Troponins elevated, Cardiology following ESRD -  on HD MWF. Not reaching EDW and signs off early in outpatient. Given CP and volume overload, s/p HD 1/15. HD again today on her MWF sched. Will reassess volume status tomorrow for further needs Hypertension/volume  - BP meds on hold for now given lower BPs, UF as tolerated with HD Anemia of CKD - ESA not due yet. Last given 1/12 in outpatient. Hgb 10.5 Secondary Hyperparathyroidism -  Check phos  Nutrition - Renal diet with fluid restriction  OP Dialysis Orders:  MWF - [facility] 3.5hrs, BFR 400, DFR 500,  EDW 68.4kg, 2K/ 2Ca Heparin  3000 units bolus with HD Mircera 225 mcg q2wks - last 09/11/24 Hectorol  5mcg IV qHD - last 09/13/24 Sensipar  90mg  with HD - last 09/13/24    Subjective:   Patient seen in room. Just getting back from OR. Has pain rightfully so. Swelling improved with HD yesterday but still feels SOB, no CP currently   Objective:   BP (!) 128/92   Pulse 76   Temp 98 F (36.7 C)   Resp 19   Ht 5' 6 (1.676 m)   Wt 74.1 kg   LMP  (LMP Unknown)   SpO2 100%   BMI 26.37 kg/m   Intake/Output Summary (Last 24 hours) at 09/15/2024 1139 Last data filed at 09/15/2024 1100 Gross per 24 hour  Intake 730 ml  Output 4000 ml  Net -3270 ml   Weight change:   Physical Exam: Gen: ill appearing CVS: RRR Resp: increased wob Abd: soft Ext: trace edema b/l Les, dressings+wound vac in place left arm Dialysis access: RIJ [facility]  Imaging: DG CHEST PORT 1 VIEW Result Date: 09/14/2024 EXAM: 1 VIEW XRAY OF THE CHEST 09/14/2024 01:35:00 PM COMPARISON: 09/08/2024 CLINICAL HISTORY: CHF (congestive heart failure) (HCC) FINDINGS: LINES, TUBES AND DEVICES: Right IJ CVC in place with tip  overlying right atrium. LUNGS AND PLEURA: Low lung volumes. Bilateral small pleural effusions. Interstitial prominence. Patchy left basilar opacities. No pneumothorax. HEART AND MEDIASTINUM: Cardiomegaly, unchanged. BONES AND SOFT TISSUES: No acute osseous abnormality. IMPRESSION: 1. Low lung volumes with bilateral small pleural effusions, interstitial edema, and patchy left basilar opacities. 2. Cardiomegaly, unchanged. 3. Right IJ CVC tip overlies the right atrium. Electronically signed by: Waddell Calk MD 09/14/2024 03:50 PM EST RP Workstation: [id number]   UE Venous Duplex (MC and [facility] ONLY) Result Date: 09/13/2024 UPPER VENOUS STUDY  Patient Name:  Linda [name]  Date of Exam:   09/13/2024 Medical Rec #: [id number]              Accession #:    [id number] Date of Birth: 07/16/1996              Patient Gender: F Patient Age:   29 years Exam Location:  [facility] Procedure:      VAS US  UPPER EXTREMITY VENOUS DUPLEX Referring Phys: ALEXIS YOUNG --------------------------------------------------------------------------------  Indications: Left arm edema, cellulitis vs DVT. Performing Technologist: Edilia Elden Appl  Examination Guidelines: A complete evaluation includes B-mode imaging, spectral Doppler, color Doppler, and power Doppler as needed of all accessible portions of each vessel. Bilateral testing is considered an integral part of a complete examination. Limited examinations for reoccurring indications may be performed as  noted.  Right Findings: +----------+------------+---------+-----------+----------+-------+ RIGHT     CompressiblePhasicitySpontaneousPropertiesSummary +----------+------------+---------+-----------+----------+-------+ IJV           Full       Yes       Yes                      +----------+------------+---------+-----------+----------+-------+ Subclavian               Yes       Yes                       +----------+------------+---------+-----------+----------+-------+  Left Findings: +----------+------------+---------+-----------+----------+-------+ LEFT      CompressiblePhasicitySpontaneousPropertiesSummary +----------+------------+---------+-----------+----------+-------+ IJV           Full       Yes       Yes                      +----------+------------+---------+-----------+----------+-------+ Subclavian    Full       Yes       Yes                      +----------+------------+---------+-----------+----------+-------+ Axillary      Full       Yes       Yes                      +----------+------------+---------+-----------+----------+-------+ Brachial      Full       Yes       Yes              Limited +----------+------------+---------+-----------+----------+-------+ Radial        Full                                          +----------+------------+---------+-----------+----------+-------+ Ulnar         Full                                          +----------+------------+---------+-----------+----------+-------+ Cephalic      Full       Yes       Yes                      +----------+------------+---------+-----------+----------+-------+ Basilic                                               AVF   +----------+------------+---------+-----------+----------+-------+ Limited exam due to patient's pain and excessive swelling. Patent fistula. Occluded graft.  Summary:  Right: No evidence of thrombosis in the subclavian.  Left: No evidence of deep vein thrombosis in the upper extremity. No evidence of superficial vein thrombosis in the upper extremity.  *See table(s) above for measurements and observations.  Diagnosing physician: Lonni Gaskins MD Electronically signed by Lonni Gaskins MD on 09/13/2024 at 8:13:03 PM.    Final     Labs: BMET Recent Labs  Lab 09/13/24 1414 09/13/24 1454 09/14/24 0411 09/15/24 0850  NA 131* 134 134* 131*   K 5.1 4.1 3.9 3.8  CL 98 93 95* 93*  CO2  --  25 27  --   GLUCOSE 86 82 100* 101*  BUN 30* 22 27* 24*  CREATININE 6.10* 5.56 6.35* 5.30*  CALCIUM   --  9.0 8.2*  --    CBC Recent Labs  Lab 09/13/24 1414 09/13/24 1454 09/14/24 0411 09/15/24 0850  WBC  --  9.2 7.8  --   NEUTROABS  --  5.6  --   --   HGB 10.2* 9.0* 8.3* 10.5*  HCT 30.0* 29.4* 28.0* 31.0*  MCV  --  92.7 94.6  --   PLT  --  292 289  --     Medications:     amLODipine   10 mg Oral QHS   carvedilol   25 mg Oral BID WC   Chlorhexidine  Gluconate Cloth  6 each Topical Q0600   cloNIDine   0.1 mg Oral Daily   doxazosin   2 mg Oral Daily   furosemide   80 mg Oral Daily   irbesartan   300 mg Oral Daily   mupirocin  ointment  1 Application Nasal BID   pantoprazole   40 mg Oral Daily   sucralfate   1 g Oral TID WC & HS      Ephriam Stank, MD [facility] 09/15/2024, 11:39 AM   "

## 2024-09-15 NOTE — TOC CM/SW Note (Signed)
 Transition of Care [facility]) - Inpatient Brief Assessment   Patient Details  Name: Linda [name] MRN: [id number] Date of Birth: 25-Mar-1996  Transition of [facility]) CM/SW Contact:    Tom-Johnson, Harvest Muskrat, RN Phone Number: 09/15/2024, 2:41 PM   Clinical Narrative:  Patient presented to the ED with worsening Left Arm Swelling and Pain. Patient recently underwent AV Fistula Revision on 09/06/24. Patient has hx of multiple AV Fistula Revisions. Patient underwent Lt Arm Brachiobasilic Fistula Ligation, Incision and Debridement with Wound Vac placement today 09/15/24. Plan to return to OR on Tuesday 09/19/24 for possible closure.   CM spoke with patient at bedside about needs for post hospital transition. Patient states she lives with her grandmother, Linda [name]. Does not have children and does not have siblings. Not employed, currently on disability. Does not have DME's at home. Independent with her care and drives self to and from her appointments.  PCP is Linda Philippe SAUNDERS, NP and uses [facility] on [address].   No ICM needs or recommendations noted at this time.  Patient not Medically ready for discharge.  CM will continue to follow as patient progresses with care towards discharge.           Transition of Care Asessment: Insurance and Status: Insurance coverage has been reviewed Patient has primary care physician: Yes Home environment has been reviewed: Yes Prior level of function:: Independent Prior/Current Home Services: No current home services Social Drivers of Health Review: SDOH reviewed no interventions necessary Readmission risk has been reviewed: Yes Transition of care needs: no transition of care needs at this time

## 2024-09-15 NOTE — Progress Notes (Signed)
 Pt receives out-pt HD at [facility] [place] on MWF 7:10 am chair time. Will assist as needed.   Randine Mungo Dialysis Navigator [phone]

## 2024-09-15 NOTE — Op Note (Signed)
" ° ° °  NAME: Linda [name]    MRN: [id number] DOB: 11-Jul-1996    DATE OF OPERATION: 09/15/2024  PREOP DIAGNOSIS:    Left upper extremity fistula malfunction  POSTOP DIAGNOSIS:    Same  PROCEDURE:    Left arm brachiobasilic fistula ligation Incision and debridement 38 cm Kerecis biologic substitute placement Vacuum-assisted dressing  SURGEON: Fonda FORBES Rim  ASSIST: Lucie Motto, PA  ANESTHESIA: Moderate  EBL: 5 mL  INDICATIONS:    [name] is a 29 y.o. female with recent history of left arm brachiobasilic fistula creation.  She presented to the ED with significant left upper extremity swelling with plans for follow-up in the outpatient setting.  Unfortunately her swelling worsened further, and she had dehiscence of the incision.  She presents today for fistula ligation.  After discussing risks and benefits, the patient elected to proceed  FINDINGS:   Severe edema in the left upper extremity with dehisced incision.  Devitalized skin and soft tissue requiring debridement.  Wound unable to close due to tension and edema.  Large subcutaneous wound defect necessitating 38 cm Kerecis biologic substitute placement, vacuum-assisted dressing with plans to return to the OR Tuesday.  TECHNIQUE:   Patient is brought to the OR laid in supine position.  Monitored anesthesia was induced and the patient's prepped draped standard fashion.  The case began with removal of the previous suture.  There was devitalized dermis which was resected.  Underneath the dermal layer, in the subcutaneous tissue, there was devitalized adipose tissue that was also debrided.  Cultures were taken.  The brachiobasilic fistula was readily visible.  This was ligated using 3, 2-0 silk sutures.  I did not transect the vein.  The brachial artery was not visible.  Patient with significant left upper extremity edema with wound defect measuring 3 cm x 6 cm x 3 cm.  I could not close the wound bed, and  elected to place greases biologic substitute in an effort to decrease the size of the wound bed as well as a vacuum-assisted dressing with blue sponge.   Patient's arm was wrapped in an Ace wrap.  Plan will be to return to the OR Tuesday for possible closure.  Fonda FORBES Rim, MD [facility] DATE OF DICTATION:   09/15/2024  "

## 2024-09-15 NOTE — Transfer of Care (Signed)
 Immediate Anesthesia Transfer of Care Note  Patient: Linda [name]  Procedure(s) Performed: LIGATION OF LEFT UPPER EXTREMITY ARTERIOVENOUS  FISTULA (Left: Arm Upper) APPLICATION, SKIN SUBSTITUTE (Left: Arm Upper) APPLICATION, WOUND VAC (Left: Arm Upper) IRRIGATION AND DEBRIDEMENT LEFT UPPER EXTREMITY FISTULA (Left: Arm Upper)  Patient Location: PACU  Anesthesia Type:General  Level of Consciousness: awake, alert , and oriented  Airway & Oxygen Therapy: Patient Spontanous Breathing  Post-op Assessment: Report given to RN, Post -op Vital signs reviewed and stable, Patient moving all extremities X 4, and Patient able to stick tongue midline  Post vital signs: Reviewed and stable  Last Vitals:  Vitals Value Taken Time  BP 107/89 09/15/24 11:00  Temp 98.6   Pulse 77 09/15/24 11:02  Resp 15 09/15/24 11:02  SpO2 96 % 09/15/24 11:02  Vitals shown include unfiled device data.  Last Pain:  Vitals:   09/15/24 0835  TempSrc: Oral  PainSc:          Complications: No notable events documented.

## 2024-09-16 ENCOUNTER — Inpatient Hospital Stay (HOSPITAL_COMMUNITY)

## 2024-09-16 DIAGNOSIS — K219 Gastro-esophageal reflux disease without esophagitis: Secondary | ICD-10-CM | POA: Diagnosis not present

## 2024-09-16 DIAGNOSIS — R0781 Pleurodynia: Secondary | ICD-10-CM | POA: Diagnosis not present

## 2024-09-16 DIAGNOSIS — I10 Essential (primary) hypertension: Secondary | ICD-10-CM | POA: Diagnosis not present

## 2024-09-16 DIAGNOSIS — N186 End stage renal disease: Secondary | ICD-10-CM | POA: Diagnosis not present

## 2024-09-16 DIAGNOSIS — M7989 Other specified soft tissue disorders: Secondary | ICD-10-CM | POA: Diagnosis not present

## 2024-09-16 DIAGNOSIS — I5021 Acute systolic (congestive) heart failure: Secondary | ICD-10-CM | POA: Diagnosis not present

## 2024-09-16 LAB — CBC
HCT: 28.5 % — ABNORMAL LOW (ref 36.0–46.0)
Hemoglobin: 8.5 g/dL — ABNORMAL LOW (ref 12.0–15.0)
MCH: 27.5 pg (ref 26.0–34.0)
MCHC: 29.8 g/dL — ABNORMAL LOW (ref 30.0–36.0)
MCV: 92.2 fL (ref 80.0–100.0)
Platelets: 249 K/uL (ref 150–400)
RBC: 3.09 MIL/uL — ABNORMAL LOW (ref 3.87–5.11)
RDW: 22.3 % — ABNORMAL HIGH (ref 11.5–15.5)
WBC: 15.5 K/uL — ABNORMAL HIGH (ref 4.0–10.5)
nRBC: 0.1 % (ref 0.0–0.2)

## 2024-09-16 LAB — BASIC METABOLIC PANEL WITH GFR
Anion gap: 12 (ref 5–15)
BUN: 19 mg/dL (ref 6–20)
CO2: 25 mmol/L (ref 22–32)
Calcium: 8.6 mg/dL — ABNORMAL LOW (ref 8.9–10.3)
Chloride: 92 mmol/L — ABNORMAL LOW (ref 98–111)
Creatinine, Ser: 3.76 mg/dL — ABNORMAL HIGH (ref 0.44–1.00)
GFR, Estimated: 16 mL/min — ABNORMAL LOW
Glucose, Bld: 112 mg/dL — ABNORMAL HIGH (ref 70–99)
Potassium: 3.8 mmol/L (ref 3.5–5.1)
Sodium: 129 mmol/L — ABNORMAL LOW (ref 135–145)

## 2024-09-16 LAB — PROCALCITONIN: Procalcitonin: 1.55 ng/mL

## 2024-09-16 MED ORDER — HEPARIN SODIUM (PORCINE) 1000 UNIT/ML IJ SOLN
INTRAMUSCULAR | Status: AC
  Filled 2024-09-16: qty 3

## 2024-09-16 MED ORDER — SODIUM CHLORIDE 0.9 % IV SOLN
1.0000 g | INTRAVENOUS | Status: DC
  Administered 2024-09-16 – 2024-09-23 (×8): 1 g via INTRAVENOUS
  Filled 2024-09-16 (×9): qty 10

## 2024-09-16 MED ORDER — DIPHENHYDRAMINE HCL 50 MG/ML IJ SOLN
50.0000 mg | Freq: Once | INTRAMUSCULAR | Status: AC
  Administered 2024-09-16: 50 mg via INTRAVENOUS
  Filled 2024-09-16: qty 1

## 2024-09-16 MED ORDER — OXYCODONE HCL 5 MG PO TABS
5.0000 mg | ORAL_TABLET | ORAL | Status: DC | PRN
  Administered 2024-09-16 – 2024-10-02 (×36): 5 mg via ORAL
  Filled 2024-09-16 (×42): qty 1

## 2024-09-16 MED ORDER — DIPHENHYDRAMINE HCL 25 MG PO CAPS: 50.0000 mg | ORAL_CAPSULE | Freq: Once | ORAL | Status: AC

## 2024-09-16 MED ORDER — IOHEXOL 350 MG/ML SOLN
75.0000 mL | Freq: Once | INTRAVENOUS | Status: AC | PRN
  Administered 2024-09-16: 75 mL via INTRAVENOUS

## 2024-09-16 MED ORDER — VANCOMYCIN HCL 1250 MG/250ML IV SOLN
1250.0000 mg | Freq: Once | INTRAVENOUS | Status: AC
  Administered 2024-09-16: 1250 mg via INTRAVENOUS
  Filled 2024-09-16: qty 250

## 2024-09-16 MED ORDER — METHYLPREDNISOLONE SODIUM SUCC 40 MG IJ SOLR
40.0000 mg | Freq: Once | INTRAMUSCULAR | Status: AC
  Administered 2024-09-16: 40 mg via INTRAVENOUS
  Filled 2024-09-16: qty 1

## 2024-09-16 MED ORDER — VANCOMYCIN HCL 750 MG/150ML IV SOLN
750.0000 mg | INTRAVENOUS | Status: DC
  Administered 2024-09-18 – 2024-09-22 (×3): 750 mg via INTRAVENOUS
  Filled 2024-09-16 (×4): qty 150

## 2024-09-16 MED ORDER — CARVEDILOL 12.5 MG PO TABS
12.5000 mg | ORAL_TABLET | Freq: Two times a day (BID) | ORAL | Status: DC
  Administered 2024-09-16 (×2): 12.5 mg via ORAL
  Filled 2024-09-16 (×2): qty 1

## 2024-09-16 NOTE — Progress Notes (Signed)
 Pharmacy Antibiotic Note  Linda [name] is a 29 y.o. female admitted on 09/13/2024 with pneumonia.  Pharmacy has been consulted for Vancomycin  / Cefepime  dosing.  With ESRD - MWF HD - on schedule  Plan: Vancomycin  1500 mg iv x 1 dose now then 750 mg iv Q HD Cefepime  1 gram iv Q 24 hours  Follow cultures, LOT  Height: 5' 6 (167.6 cm) Weight: 71.1 kg (156 lb 12 oz) IBW/kg (Calculated) : 59.3  Temp (24hrs), Avg:98.6 F (37 C), Min:98 F (36.7 C), Max:100.3 F (37.9 C)  Recent Labs  Lab 09/13/24 1414 09/13/24 1415 09/13/24 1454 09/14/24 0411 09/15/24 0850 09/16/24 0550  WBC  --   --  9.2 7.8  --  15.5*  CREATININE 6.10*  --  5.56 6.35* 5.30* 3.76*  LATICACIDVEN  --  1.9  --   --   --   --     Estimated Creatinine Clearance: 20.7 mL/min (A) (by C-G formula based on SCr of 3.76 mg/dL (H)).    Allergies[1]    Thank you for allowing pharmacy to be a part of this patients care.  Linda [name] 09/16/2024 10:12 AM     [1]  Allergies Allergen Reactions   Iodine  Hives and Shortness Of Breath   Lisinopril  Anaphylaxis    Angioedema   Gabapentin Other (See Comments)    Reaction: Tremor (intolerance); tremor   Nsaids Other (See Comments)    ESRD    Ativan  [Lorazepam ] Other (See Comments)    Somnolent with 1mg  ativan  at [facility] Nov 2019. Patient stated it made her feel like she shut down and was mute and non-responding.   Hydroxychloroquine  Other (See Comments)    Pt reports making her skin peel really bad   Vicodin [Hydrocodone -Acetaminophen ] Itching and Rash

## 2024-09-16 NOTE — Progress Notes (Signed)
 " [facility] Progress Note   Subjective:   Tolerated HD overnight with net UF 3L. Reports she is having pain under her right breast with deep breaths. Feels slightly SOB. Denies dizziness and nausea.   Objective Vitals:   09/16/24 0030 09/16/24 0057 09/16/24 0449 09/16/24 0818  BP: (!) 148/103 (!) 148/102 (!) 140/101 (!) 144/101  Pulse: 86 82 91 94  Resp: 20 18 18 18   Temp: 99 F (37.2 C) 98.6 F (37 C) 98.7 F (37.1 C) 98.4 F (36.9 C)  TempSrc: Oral     SpO2: 100% 100% 94% 93%  Weight: 71.1 kg     Height:       Physical Exam General: alert female in NAD Heart: RRR, no murmur Lungs: diminished breath sounds in bases, no rhonchi/rales auscultated. Respirations unlabored Abdomen: Soft, non-distneded, +BS Extremities: no edema b/l lower extremities Dialysis Access:  L arm wrapped with wound vac, [facility]  Additional Objective Labs: Basic Metabolic Panel: Recent Labs  Lab 09/13/24 1454 09/14/24 0411 09/15/24 0850 09/16/24 0550  NA 134 134* 131* 129*  K 4.1 3.9 3.8 3.8  CL 93 95* 93* 92*  CO2 25 27  --  25  GLUCOSE 82 100* 101* 112*  BUN 22 27* 24* 19  CREATININE 5.56 6.35* 5.30* 3.76*  CALCIUM  9.0 8.2*  --  8.6*   Liver Function Tests: Recent Labs  Lab 09/13/24 1454  AST 26  ALT 7  ALKPHOS 174  BILITOT 1.1  PROT 7.9  ALBUMIN  3.3   No results for input(s): LIPASE, AMYLASE in the last 168 hours. CBC: Recent Labs  Lab 09/13/24 1454 09/14/24 0411 09/15/24 0850 09/16/24 0550  WBC 9.2 7.8  --  15.5*  NEUTROABS 5.6  --   --   --   HGB 9.0* 8.3* 10.5* 8.5*  HCT 29.4* 28.0* 31.0* 28.5*  MCV 92.7 94.6  --  92.2  PLT 292 289  --  249   Blood Culture    Component Value Date/Time   SDES ARM 09/15/2024 1030   SPECREQUEST LEFT AV FISTULA 09/15/2024 1030   CULT PENDING 09/15/2024 1030   REPTSTATUS PENDING 09/15/2024 1030    Cardiac Enzymes: No results for input(s): CKTOTAL, CKMB, CKMBINDEX, TROPONINI in the last 168  hours. CBG: No results for input(s): GLUCAP in the last 168 hours. Iron  Studies: No results for input(s): IRON , TIBC, TRANSFERRIN, FERRITIN in the last 72 hours. @lablastinr3 @ Studies/Results: DG CHEST PORT 1 VIEW Result Date: 09/14/2024 EXAM: 1 VIEW XRAY OF THE CHEST 09/14/2024 01:35:00 PM COMPARISON: 09/08/2024 CLINICAL HISTORY: CHF (congestive heart failure) (HCC) FINDINGS: LINES, TUBES AND DEVICES: Right IJ CVC in place with tip overlying right atrium. LUNGS AND PLEURA: Low lung volumes. Bilateral small pleural effusions. Interstitial prominence. Patchy left basilar opacities. No pneumothorax. HEART AND MEDIASTINUM: Cardiomegaly, unchanged. BONES AND SOFT TISSUES: No acute osseous abnormality. IMPRESSION: 1. Low lung volumes with bilateral small pleural effusions, interstitial edema, and patchy left basilar opacities. 2. Cardiomegaly, unchanged. 3. Right IJ CVC tip overlies the right atrium. Electronically signed by: Taylor Stroud MD 09/14/2024 03:50 PM EST RP Workstation: [id number]   Medications:   amLODipine   10 mg Oral QHS   carvedilol   12.5 mg Oral BID WC   Chlorhexidine  Gluconate Cloth  6 each Topical Q0600   cloNIDine   0.1 mg Oral Daily   furosemide   80 mg Oral Daily   irbesartan   300 mg Oral Daily   mupirocin  ointment  1 Application Nasal BID   pantoprazole   40  mg Oral Daily   sucralfate   1 g Oral TID WC & HS    Dialysis Orders: MWF - [facility] 3.5hrs, BFR 400, DFR 500,  EDW 68.4kg, 2K/ 2Ca Heparin  3000 units bolus with HD Mircera 225 mcg q2wks - last 09/11/24 Hectorol  5mcg IV qHD - last 09/13/24 Sensipar  90mg  with HD - last 09/13/24  Assessment/Plan: AVF malfunction - VVS following. Hx multiple revisions in the past. Most recent revision 1/7. Scheduled ligation aborted today 2nd pt c/o CP. S/p ligation 1/16 Chest Pain - Troponins elevated initially, cardiology felt not anginal. Pain today seems pleuritic in nature, CXR is pending.  ESRD -  on HD MWF.  Not reaching EDW and signs off early in outpatient. Given CP and volume overload, s/p HD 1/15. Had HD overnight, will plan for next HD on Monday. Continue to encourage HD compliance and fluid restrictions.  Hypertension/volume  - BP meds on hold for now given lower BPs, UF as tolerated with HD as above Anemia of CKD - ESA not due yet. Last given 1/12 in outpatient. Hgb 8.5- declined post op Secondary Hyperparathyroidism -  calcium  at goal, will check phos Nutrition - Renal diet with fluid restriction  Lucie Collet, PA-C 09/16/2024, 9:02 AM  [facility] Pager: [phone]   "

## 2024-09-16 NOTE — Progress Notes (Signed)
 "          [facility]                                                                              Linda [name], is a 29 y.o. female, DOB - March 17, 1996, FMW:[id number] Admit date - 09/13/2024    Outpatient Primary MD for the patient is [name], NP  LOS - 2  days  Chief Complaint  Patient presents with   Post-op Problem   Arm Pain   Emesis   Nausea   knots in my thighs       Brief summary   Patient is a 29 year old female with hypertension, G6PD deficiency, CHF, lupus, ESRD on HD, MWF schedule, history of multiple AV fistula revisions presented with progressively worsening left arm swelling and pain since after recent AV fistula surgery on 09/06/2024 (Dr Serene).  She noted wound dehiscence about 3 days ago prior to admission.  No fever or chills, nausea vomiting or abdominal pain.  Patient is receiving dialysis via right IJ TDC. Vascular surgery was consulted.  Assessment & Plan     Left upper extremity edema and pain with ongoing drainage from incision after recent AV fistula surgery - Vascular surgery following, underwent left arm placed acute basilic fistula ligation, incision and debridement with wound VAC placement on 09/15/2024 - Per vascular surgery, plan to return to the OR on Tuesday, 09/19/2024 for possible closure  Chest pain with mildly elevated troponin -Resolved.  Last 2D echo 03/06/2024 showed EF of 30 to 35% with global hypokinesis, normal RV SF - Seen by cardiology, chest pain not anginal, troponins flat echocardiogram - Today complaining of right-sided chest pain, pleuritic, radiating to the right upper quadrant.  Worsening leukocytosis 15.5, blood culture 1/14 negative. - -Stat chest x-ray showed increasing dense left lower lobe consolidation.  Placed on IV vancomycin  and cefepime  -Obtain blood culture, procalcitonin -Obtain CTA chest for further workup, rule out PE, needs contrast prophylaxis   ESRD on HD MWF -Currently receiving dialysis  treatments via right IJ TDC, last HD on 1/14 - Renal following, continue HD per schedule  Chronic HFrEF -2D echo 02/2024 showed EF 30 to 35%, moderate biatrial dilation, mild mitral regurgitation, and moderate tricuspid regurgitation.   - volume management with HD.    Hypertension - Antihypertensives were placed on hold due to lower BP. - Now BP elevated, resume antihypertensives - On hemodialysis, likely does not need Lasix , will defer to nephrology  GERD Continue PPI and sucralfate .   Anemia of chronic disease H&H stable, baseline 9-10 - Hemoglobin 8.5, continue to monitor   Estimated body mass index is 25.3 kg/m as calculated from the following:   Height as of this encounter: 5' 6 (1.676 m).   Weight as of this encounter: 71.1 kg.  Code Status: Full code DVT Prophylaxis:  SCDs Start: 09/13/24 2127   Level of Care: Level of care: Telemetry Family Communication: Updated patient Disposition Plan:      Remains inpatient appropriate:      Procedures:  Left arm brachiobasilic fistula ligation Incision and debridement 38 cm Kerecis biologic substitute placement Vacuum-assisted dressing  Consultants:   Nephrology Vascular surgery Cardiology  Antimicrobials:  Anti-infectives (From admission, onward)    Start     Dose/Rate Route Frequency Ordered Stop   09/18/24 1200  vancomycin  (VANCOREADY) IVPB 750 mg/150 mL        750 mg 150 mL/hr over 60 Minutes Intravenous Every M-W-F (Hemodialysis) 09/16/24 1008     09/16/24 1200  ceFEPIme  (MAXIPIME ) 1 g in sodium chloride  0.9 % 100 mL IVPB        1 g 200 mL/hr over 30 Minutes Intravenous Every 24 hours 09/16/24 1006     09/16/24 1100  vancomycin  (VANCOREADY) IVPB 1250 mg/250 mL        1,250 mg 166.7 mL/hr over 90 Minutes Intravenous  Once 09/16/24 1008            Medications  amLODipine   10 mg Oral QHS   carvedilol   12.5 mg Oral BID WC   Chlorhexidine  Gluconate Cloth  6 each Topical Q0600   cloNIDine   0.1 mg Oral  Daily   diphenhydrAMINE   50 mg Oral Once   Or   diphenhydrAMINE   50 mg Intravenous Once   furosemide   80 mg Oral Daily   irbesartan   300 mg Oral Daily   mupirocin  ointment  1 Application Nasal BID   pantoprazole   40 mg Oral Daily   sucralfate   1 g Oral TID WC & HS      Subjective:   Linda [name] was seen and examined today.  Complaining of right-sided chest pain, pleuritic, radiating to right upper quadrant.  No fevers.  No nausea vomiting any diarrhea.  Feeling miserable.     Objective:   Vitals:   09/16/24 0030 09/16/24 0057 09/16/24 0449 09/16/24 0818  BP: (!) 148/103 (!) 148/102 (!) 140/101 (!) 144/101  Pulse: 86 82 91 94  Resp: 20 18 18 18   Temp: 99 F (37.2 C) 98.6 F (37 C) 98.7 F (37.1 C) 98.4 F (36.9 C)  TempSrc: Oral   Oral  SpO2: 100% 100% 94% 93%  Weight: 71.1 kg     Height:        Intake/Output Summary (Last 24 hours) at 09/16/2024 1156 Last data filed at 09/16/2024 0616 Gross per 24 hour  Intake 590 ml  Output 3000 ml  Net -2410 ml     Wt Readings from Last 3 Encounters:  09/16/24 71.1 kg  09/06/24 70.3 kg  07/31/24 73.9 kg    Physical Exam General: Alert and oriented x 3, NAD, uncomfortable with chest pain Cardiovascular: S1 S2 clear, RRR.  Respiratory: CTAB, no wheezing,  Gastrointestinal: Soft, nontender, nondistended, NBS Ext: no pedal edema bilaterally, L UE edema Neuro: no new deficits Skin: Dressing intact with wound VAC Psych: Uncomfortable   Data Reviewed:  I have personally reviewed following labs    CBC Lab Results  Component Value Date   WBC 15.5 (H) 09/16/2024   RBC 3.09 (L) 09/16/2024   HGB 8.5 (L) 09/16/2024   HCT 28.5 (L) 09/16/2024   MCV 92.2 09/16/2024   MCH 27.5 09/16/2024   PLT 249 09/16/2024   MCHC 29.8 (L) 09/16/2024   RDW 22.3 (H) 09/16/2024   LYMPHSABS 2.0 09/13/2024   MONOABS 1.2 (H) 09/13/2024   EOSABS 0.1 09/13/2024   BASOSABS 0.0 09/13/2024     Last metabolic panel Lab Results  Component  Value Date   NA 129 (L) 09/16/2024   K 3.8 09/16/2024   CL 92 (L) 09/16/2024   CO2 25 09/16/2024   BUN 19 09/16/2024   CREATININE 3.76 (H) 09/16/2024  GLUCOSE 112 (H) 09/16/2024   GFRNONAA 16 (L) 09/16/2024   GFRAA 19 (L) 03/27/2019   CALCIUM  8.6 (L) 09/16/2024   PHOS 9.1 (H) 05/31/2024   PROT 7.9 09/13/2024   ALBUMIN  3.3 09/13/2024   BILITOT 1.1 09/13/2024   ALKPHOS 174 09/13/2024   AST 26 09/13/2024   ALT 7 09/13/2024   ANIONGAP 12 09/16/2024    CBG (last 3)  No results for input(s): GLUCAP in the last 72 hours.    Coagulation Profile: No results for input(s): INR, PROTIME in the last 168 hours.   Radiology Studies: I have personally reviewed the imaging studies  DG Chest 1 View Result Date: 09/16/2024 EXAM: 1 VIEW(S) XRAY OF THE CHEST 09/16/2024 09:05:00 AM COMPARISON: 09/14/2024 CLINICAL HISTORY: Chest pain FINDINGS: LINES, TUBES AND DEVICES: Stable right IJ dialysis catheter. LUNGS AND PLEURA: Continued low lung volumes. Resolved interstitial edema. Increased dense left lower lobe consolidation. Small pleural effusions suspected. No pneumothorax. HEART AND MEDIASTINUM: Stable moderate to severe cardiomegaly. BONES AND SOFT TISSUES: No acute osseous abnormality. IMPRESSION: 1. Increased dense left lower lobe consolidation. 2. Small pleural effusions suspected. Electronically signed by: Waddell Calk MD 09/16/2024 09:18 AM EST RP Workstation: [id number]   DG CHEST PORT 1 VIEW Result Date: 09/14/2024 EXAM: 1 VIEW XRAY OF THE CHEST 09/14/2024 01:35:00 PM COMPARISON: 09/08/2024 CLINICAL HISTORY: CHF (congestive heart failure) (HCC) FINDINGS: LINES, TUBES AND DEVICES: Right IJ CVC in place with tip overlying right atrium. LUNGS AND PLEURA: Low lung volumes. Bilateral small pleural effusions. Interstitial prominence. Patchy left basilar opacities. No pneumothorax. HEART AND MEDIASTINUM: Cardiomegaly, unchanged. BONES AND SOFT TISSUES: No acute osseous abnormality. IMPRESSION:  1. Low lung volumes with bilateral small pleural effusions, interstitial edema, and patchy left basilar opacities. 2. Cardiomegaly, unchanged. 3. Right IJ CVC tip overlies the right atrium. Electronically signed by: Waddell Calk MD 09/14/2024 03:50 PM EST RP Workstation: [id number]       Nydia Distance M.D. [facility] 09/16/2024, 11:56 AM  Available via Epic secure chat 7am-7pm After 7 pm, please refer to night coverage provider listed on amion.    "

## 2024-09-16 NOTE — Progress Notes (Signed)
 Received patient in bed to unit.  Alert and oriented.  Informed consent signed and in chart.   TX duration: 3:30  Patient tolerated well.  Transported back to the room  Alert, without acute distress.  Hand-off given to patient's nurse. Shift RN  Access used: Dialysis Catheter Access issues: None  Total UF removed: 3000 Medication(s) given: Dilaudid  1 mg Post HD VS: [id number] B/P148/103 Post HD weight: 71.1kg  Neville Seip, RN [facility]   09/16/24 0030  Vitals  Temp 99 F (37.2 C)  Temp Source Oral  BP (!) 148/103  MAP (mmHg) 117  BP Location Right Arm  BP Method Automatic  Patient Position (if appropriate) Lying  Pulse Rate 86  Pulse Rate Source Monitor  ECG Heart Rate 85  Resp 20  Weight 71.1 kg  Type of Weight Post-Dialysis  Oxygen Therapy  SpO2 100 %  O2 Device Nasal Cannula  O2 Flow Rate (L/min) 2 L/min  Patient Activity (if Appropriate) In bed  Pulse Oximetry Type Continuous  During Treatment Monitoring  Blood Flow Rate (mL/min) 0 mL/min  Arterial Pressure (mmHg) 34.74 mmHg  Venous Pressure (mmHg) -487.45 mmHg  TMP (mmHg) 19.19 mmHg  Ultrafiltration Rate (mL/min) 1086 mL/min  Dialysate Flow Rate (mL/min) 299 ml/min  Dialysate Potassium Concentration 3  Dialysate Calcium  Concentration 2.5  Duration of HD Treatment -hour(s) 3.5 hour(s)  Cumulative Fluid Removed (mL) per Treatment  3000.16  HD Safety Checks Performed Yes  Intra-Hemodialysis Comments Tolerated well;Tx completed  Post Treatment  Dialyzer Clearance Lightly streaked  Liters Processed 73.5  Fluid Removed (mL) 3000 mL  Tolerated HD Treatment Yes  Hemodialysis Catheter Right Internal jugular Double lumen Temporary (Non-Tunneled)  No placement date or time found.   Orientation: Right  Access Location: Internal jugular  Hemodialysis Catheter Type: Double lumen Temporary (Non-Tunneled)  Site Condition No complications  Blue Lumen Status Flushed;Antimicrobial dead end  cap;Heparin  locked  Red Lumen Status Flushed;Antimicrobial dead end cap;Heparin  locked  Purple Lumen Status N/A  Catheter fill solution Heparin  1000 units/ml  Catheter fill volume (Arterial) 1.9 cc  Catheter fill volume (Venous) 1.9  Dressing Type Transparent  Dressing Status Antimicrobial disc/dressing in place;Clean, Dry, Intact  Drainage Description None  Dressing Change Due 09/21/24  Post treatment catheter status Capped and Clamped

## 2024-09-16 NOTE — Progress Notes (Signed)
 "   Progress Note  Patient Name: Linda [name] Date of Encounter: 09/16/2024  Primary Cardiologist:   None   Subjective   She continues to have chest pain and SOB.   Inpatient Medications    Scheduled Meds:  amLODipine   10 mg Oral QHS   carvedilol   12.5 mg Oral BID WC   Chlorhexidine  Gluconate Cloth  6 each Topical Q0600   cloNIDine   0.1 mg Oral Daily   diphenhydrAMINE   50 mg Oral Once   Or   diphenhydrAMINE   50 mg Intravenous Once   furosemide   80 mg Oral Daily   irbesartan   300 mg Oral Daily   mupirocin  ointment  1 Application Nasal BID   pantoprazole   40 mg Oral Daily   sucralfate   1 g Oral TID WC & HS   Continuous Infusions:  ceFEPime  (MAXIPIME ) IV     vancomycin  1,250 mg (09/16/24 1107)   [START ON 09/18/2024] vancomycin      PRN Meds: hydrALAZINE , HYDROmorphone  (DILAUDID ) injection, naLOXone  (NARCAN )  injection, nitroGLYCERIN , oxyCODONE    Vital Signs    Vitals:   09/16/24 0030 09/16/24 0057 09/16/24 0449 09/16/24 0818  BP: (!) 148/103 (!) 148/102 (!) 140/101 (!) 144/101  Pulse: 86 82 91 94  Resp: 20 18 18 18   Temp: 99 F (37.2 C) 98.6 F (37 C) 98.7 F (37.1 C) 98.4 F (36.9 C)  TempSrc: Oral   Oral  SpO2: 100% 100% 94% 93%  Weight: 71.1 kg     Height:        Intake/Output Summary (Last 24 hours) at 09/16/2024 1118 Last data filed at 09/16/2024 0616 Gross per 24 hour  Intake 590 ml  Output 3000 ml  Net -2410 ml   Filed Weights   09/15/24 0835 09/15/24 2030 09/16/24 0030  Weight: 74.1 kg 74.1 kg 71.1 kg    Telemetry    NSR - Personally Reviewed  ECG    NA - Personally Reviewed  Physical Exam   GEN: No acute distress.   Neck: No  JVD Cardiac: RRR, to and fro systolic and diastolic murmur, rubs, or gallops.  Respiratory:    Decreased breath sounds bilaterally at the bases. GI: Soft, nontender, non-distended  MS: No  edema; No deformity. Neuro:  Nonfocal  Psych: Normal affect   Labs    Chemistry Recent Labs  Lab  09/13/24 1454 09/14/24 0411 09/15/24 0850 09/16/24 0550  NA 134 134* 131* 129*  K 4.1 3.9 3.8 3.8  CL 93 95* 93* 92*  CO2 25 27  --  25  GLUCOSE 82 100* 101* 112*  BUN 22 27* 24* 19  CREATININE 5.56 6.35* 5.30* 3.76*  CALCIUM  9.0 8.2*  --  8.6*  PROT 7.9  --   --   --   ALBUMIN  3.3  --   --   --   AST 26  --   --   --   ALT 7  --   --   --   ALKPHOS 174  --   --   --   BILITOT 1.1  --   --   --   GFRNONAA 10 8*  --  16*  ANIONGAP 16 13  --  12     Hematology Recent Labs  Lab 09/13/24 1454 09/14/24 0411 09/15/24 0850 09/16/24 0550  WBC 9.2 7.8  --  15.5*  RBC 3.17* 2.96*  --  3.09*  HGB 9.0* 8.3* 10.5* 8.5*  HCT 29.4* 28.0* 31.0* 28.5*  MCV 92.7  94.6  --  92.2  MCH 28.4 28.0  --  27.5  MCHC 30.6 29.6*  --  29.8*  RDW 21.9* 22.3*  --  22.3*  PLT 292 289  --  249    Cardiac EnzymesNo results for input(s): TROPONINI in the last 168 hours. No results for input(s): TROPIPOC in the last 168 hours.   BNPNo results for input(s): BNP, PROBNP in the last 168 hours.   DDimer No results for input(s): DDIMER in the last 168 hours.   Radiology    DG Chest 1 View Result Date: 09/16/2024 EXAM: 1 VIEW(S) XRAY OF THE CHEST 09/16/2024 09:05:00 AM COMPARISON: 09/14/2024 CLINICAL HISTORY: Chest pain FINDINGS: LINES, TUBES AND DEVICES: Stable right IJ dialysis catheter. LUNGS AND PLEURA: Continued low lung volumes. Resolved interstitial edema. Increased dense left lower lobe consolidation. Small pleural effusions suspected. No pneumothorax. HEART AND MEDIASTINUM: Stable moderate to severe cardiomegaly. BONES AND SOFT TISSUES: No acute osseous abnormality. IMPRESSION: 1. Increased dense left lower lobe consolidation. 2. Small pleural effusions suspected. Electronically signed by: Waddell Calk MD 09/16/2024 09:18 AM EST RP Workstation: [id number]   DG CHEST PORT 1 VIEW Result Date: 09/14/2024 EXAM: 1 VIEW XRAY OF THE CHEST 09/14/2024 01:35:00 PM COMPARISON: 09/08/2024 CLINICAL  HISTORY: CHF (congestive heart failure) (HCC) FINDINGS: LINES, TUBES AND DEVICES: Right IJ CVC in place with tip overlying right atrium. LUNGS AND PLEURA: Low lung volumes. Bilateral small pleural effusions. Interstitial prominence. Patchy left basilar opacities. No pneumothorax. HEART AND MEDIASTINUM: Cardiomegaly, unchanged. BONES AND SOFT TISSUES: No acute osseous abnormality. IMPRESSION: 1. Low lung volumes with bilateral small pleural effusions, interstitial edema, and patchy left basilar opacities. 2. Cardiomegaly, unchanged. 3. Right IJ CVC tip overlies the right atrium. Electronically signed by: Waddell Calk MD 09/14/2024 03:50 PM EST RP Workstation: [id number]    Cardiac Studies   Echo pending.   Patient Profile     29 y.o. female with ESRD, DCM being evaluated for chest pain and volume overload.    Assessment & Plan    Chest pain:    Non anginal chest pain. non anginal no trend in mildly elevated troponin in setting of CRF. No acute ECG changes. Has known non ischemic DCM with TTE 03/06/24  EF 30-35%  Patient indicates that morphine  works better than fentanyl  for pain   CHF:  Net negative 3 liters with dialysis.  Still with SOB and being treated for possible pneumonia.  CTA pending to rule out PE.  Nephrology will follow to see if she needs UF tonight.  Will order echo given continued issues with SOB and acute systolic HF.    VVS:    Status post I/D of left arm brachiobasilic fistula.  Possible return trip to OR early next week to close the wound.    CRF:  Dialysis per nephrology.     For questions or updates, please contact [facility] Please consult [email or web address] for contact info under Cardiology/STEMI.   Signed, Lynwood Schilling, MD  09/16/2024, 11:18 AM    "

## 2024-09-16 NOTE — Progress Notes (Signed)
 C/o chest pain Left arm wrapped in ACE Chr ordered Plan for return trip to OR for possible wound closure on Tuesday  [name]

## 2024-09-17 ENCOUNTER — Inpatient Hospital Stay (HOSPITAL_COMMUNITY)

## 2024-09-17 DIAGNOSIS — I10 Essential (primary) hypertension: Secondary | ICD-10-CM | POA: Diagnosis not present

## 2024-09-17 DIAGNOSIS — R0781 Pleurodynia: Secondary | ICD-10-CM | POA: Diagnosis not present

## 2024-09-17 DIAGNOSIS — N186 End stage renal disease: Secondary | ICD-10-CM | POA: Diagnosis not present

## 2024-09-17 DIAGNOSIS — M7989 Other specified soft tissue disorders: Secondary | ICD-10-CM | POA: Diagnosis not present

## 2024-09-17 DIAGNOSIS — I5021 Acute systolic (congestive) heart failure: Secondary | ICD-10-CM

## 2024-09-17 DIAGNOSIS — K219 Gastro-esophageal reflux disease without esophagitis: Secondary | ICD-10-CM | POA: Diagnosis not present

## 2024-09-17 LAB — BASIC METABOLIC PANEL WITH GFR
Anion gap: 15 (ref 5–15)
BUN: 38 mg/dL — ABNORMAL HIGH (ref 6–20)
CO2: 23 mmol/L (ref 22–32)
Calcium: 9.6 mg/dL (ref 8.9–10.3)
Chloride: 91 mmol/L — ABNORMAL LOW (ref 98–111)
Creatinine, Ser: 5.61 mg/dL — ABNORMAL HIGH (ref 0.44–1.00)
GFR, Estimated: 10 mL/min — ABNORMAL LOW
Glucose, Bld: 139 mg/dL — ABNORMAL HIGH (ref 70–99)
Potassium: 3.8 mmol/L (ref 3.5–5.1)
Sodium: 129 mmol/L — ABNORMAL LOW (ref 135–145)

## 2024-09-17 LAB — ECHOCARDIOGRAM COMPLETE
Area-P 1/2: 2.83 cm2
Calc EF: 41.8 %
Height: 66 in
MV M vel: 3.67 m/s
MV Peak grad: 53.9 mmHg
S' Lateral: 3.9 cm
Single Plane A2C EF: 39.5 %
Single Plane A4C EF: 43.7 %
Weight: 2507.95 [oz_av]

## 2024-09-17 LAB — CBC
HCT: 29 % — ABNORMAL LOW (ref 36.0–46.0)
Hemoglobin: 8.9 g/dL — ABNORMAL LOW (ref 12.0–15.0)
MCH: 28.3 pg (ref 26.0–34.0)
MCHC: 30.7 g/dL (ref 30.0–36.0)
MCV: 92.4 fL (ref 80.0–100.0)
Platelets: 260 K/uL (ref 150–400)
RBC: 3.14 MIL/uL — ABNORMAL LOW (ref 3.87–5.11)
RDW: 21.5 % — ABNORMAL HIGH (ref 11.5–15.5)
WBC: 18.7 K/uL — ABNORMAL HIGH (ref 4.0–10.5)
nRBC: 0 % (ref 0.0–0.2)

## 2024-09-17 MED ORDER — CHLORHEXIDINE GLUCONATE CLOTH 2 % EX PADS
6.0000 | MEDICATED_PAD | Freq: Every day | CUTANEOUS | Status: DC
  Administered 2024-09-17 – 2024-09-19 (×3): 6 via TOPICAL

## 2024-09-17 MED ORDER — CARVEDILOL 25 MG PO TABS
25.0000 mg | ORAL_TABLET | Freq: Two times a day (BID) | ORAL | Status: DC
  Administered 2024-09-17 – 2024-10-05 (×32): 25 mg via ORAL
  Filled 2024-09-17 (×22): qty 1
  Filled 2024-09-17: qty 2
  Filled 2024-09-17 (×9): qty 1

## 2024-09-17 NOTE — Plan of Care (Signed)
   Problem: Clinical Measurements: Goal: Diagnostic test results will improve Outcome: Progressing

## 2024-09-17 NOTE — Plan of Care (Signed)
   Problem: Health Behavior/Discharge Planning: Goal: Ability to manage health-related needs will improve Outcome: Progressing

## 2024-09-17 NOTE — Progress Notes (Signed)
 " [facility] Progress Note   Subjective:   Pt reports she is still having pain under her ribs with deep breaths, reports she feels she is breathing shallowly to prevent the pain. Denies HA, dizziness, blurry vision.   Objective Vitals:   09/17/24 0805 09/17/24 0924 09/17/24 0924 09/17/24 0930  BP: (!) 148/66 (!) 155/108  (!) 155/108  Pulse: 64 71 72 77  Resp:    18  Temp:  97.9 F (36.6 C)  97.9 F (36.6 C)  TempSrc:    Oral  SpO2: 95% 100% 100% 100%  Weight:      Height:       Physical Exam General: Alert female in NAD Heart: RRR, no murmur Lungs: diminished breath sounds in bases, no rhonchi/ rales auscultated  Abdomen: Soft, non-distended Extremities: No edema b/l lower extremities Dialysis Access:  L arm wrapped, [facility]  Additional Objective Labs: Basic Metabolic Panel: Recent Labs  Lab 09/14/24 0411 09/15/24 0850 09/16/24 0550 09/17/24 0404  NA 134* 131* 129* 129*  K 3.9 3.8 3.8 3.8  CL 95* 93* 92* 91*  CO2 27  --  25 23  GLUCOSE 100* 101* 112* 139*  BUN 27* 24* 19 38*  CREATININE 6.35* 5.30* 3.76* 5.61*  CALCIUM  8.2*  --  8.6* 9.6   Liver Function Tests: Recent Labs  Lab 09/13/24 1454  AST 26  ALT 7  ALKPHOS 174  BILITOT 1.1  PROT 7.9  ALBUMIN  3.3   No results for input(s): LIPASE, AMYLASE in the last 168 hours. CBC: Recent Labs  Lab 09/13/24 1454 09/14/24 0411 09/15/24 0850 09/16/24 0550 09/17/24 0404  WBC 9.2 7.8  --  15.5* 18.7*  NEUTROABS 5.6  --   --   --   --   HGB 9.0* 8.3* 10.5* 8.5* 8.9*  HCT 29.4* 28.0* 31.0* 28.5* 29.0*  MCV 92.7 94.6  --  92.2 92.4  PLT 292 289  --  249 260   Blood Culture    Component Value Date/Time   SDES BLOOD RIGHT HAND 09/16/2024 1326   SPECREQUEST  09/16/2024 1326    BOTTLES DRAWN AEROBIC ONLY Blood Culture results may not be optimal due to an inadequate volume of blood received in culture bottles   CULT  09/16/2024 1326    NO GROWTH < 24 HOURS Performed at [facility]  Lab, [address] [place], [place] [address]    REPTSTATUS PENDING 09/16/2024 1326    Cardiac Enzymes: No results for input(s): CKTOTAL, CKMB, CKMBINDEX, TROPONINI in the last 168 hours. CBG: No results for input(s): GLUCAP in the last 168 hours. Iron  Studies: No results for input(s): IRON , TIBC, TRANSFERRIN, FERRITIN in the last 72 hours. @lablastinr3 @ Studies/Results: CT Angio Chest Pulmonary Embolism (PE) W or WO Contrast Result Date: 09/16/2024 EXAM: CTA CHEST 09/16/2024 03:39:59 PM TECHNIQUE: CTA of the chest was performed without and with the administration of 75 mL of iohexol  (OMNIPAQUE ) 350 MG/ML intravenous contrast. Multiplanar reformatted images are provided for review. MIP images are provided for review. Automated exposure control, iterative reconstruction, and/or weight based adjustment of the mA/kV was utilized to reduce the radiation dose to as low as reasonably achievable. COMPARISON: None available. CLINICAL HISTORY: Pulmonary embolism (PE) suspected, high prob. FINDINGS: PULMONARY ARTERIES: Pulmonary arteries are adequately opacified for evaluation. No acute pulmonary embolism. Main pulmonary artery is normal in caliber. MEDIASTINUM: Moderate pericardial effusion. Effusion measures approximately 12 mm along the left heart border and 20 mm along the right atrium. There is no  acute abnormality of the thoracic aorta. LYMPH NODES: No mediastinal, hilar or axillary lymphadenopathy. LUNGS AND PLEURA: There is dense airspace disease within the left and right lower lobes, greater on the left. Bibasilar consolidation representing atelectasis, aspiration, pneumonia. There is underlying diffuse ground glass airspace disease. Diffuse ground glass airspace disease suggests pulmonary edema. No evidence of pleural effusion or pneumothorax. UPPER ABDOMEN: Limited images of the upper abdomen are unremarkable. SOFT TISSUES AND BONES: No acute bone or soft tissue abnormality. IMPRESSION:  1. No acute pulmonary embolism. 2. Bibasilar consolidation, which may represent atelectasis, aspiration, or pneumonia. 3. Diffuse ground-glass opacities, which may represent pulmonary edema. 4. Moderate pericardial effusion. Electronically signed by: Norleen Boxer MD 09/16/2024 04:06 PM EST RP Workstation: [id number]   DG Chest 1 View Result Date: 09/16/2024 EXAM: 1 VIEW(S) XRAY OF THE CHEST 09/16/2024 09:05:00 AM COMPARISON: 09/14/2024 CLINICAL HISTORY: Chest pain FINDINGS: LINES, TUBES AND DEVICES: Stable right IJ dialysis catheter. LUNGS AND PLEURA: Continued low lung volumes. Resolved interstitial edema. Increased dense left lower lobe consolidation. Small pleural effusions suspected. No pneumothorax. HEART AND MEDIASTINUM: Stable moderate to severe cardiomegaly. BONES AND SOFT TISSUES: No acute osseous abnormality. IMPRESSION: 1. Increased dense left lower lobe consolidation. 2. Small pleural effusions suspected. Electronically signed by: Waddell Calk MD 09/16/2024 09:18 AM EST RP Workstation: [id number]   Medications:  ceFEPime  (MAXIPIME ) IV Stopped (09/16/24 1323)   [START ON 09/18/2024] vancomycin       amLODipine   10 mg Oral QHS   carvedilol   25 mg Oral BID WC   Chlorhexidine  Gluconate Cloth  6 each Topical Q0600   cloNIDine   0.1 mg Oral Daily   furosemide   80 mg Oral Daily   irbesartan   300 mg Oral Daily   mupirocin  ointment  1 Application Nasal BID   pantoprazole   40 mg Oral Daily   sucralfate   1 g Oral TID WC & HS    Dialysis Orders: MWF - [facility] 3.5hrs, BFR 400, DFR 500,  EDW 68.4kg, 2K/ 2Ca Heparin  3000 units bolus with HD Mircera 225 mcg q2wks - last 09/11/24 Hectorol  5mcg IV qHD - last 09/13/24 Sensipar  90mg  with HD - last 09/13/24  Assessment/Plan: AVF malfunction - VVS following. Hx multiple revisions in the past. Most recent revision 1/7. Scheduled ligation aborted today 2nd pt c/o CP. S/p ligation 1/16 Chest Pain - Troponins elevated initially, cardiology  felt not anginal. Pain today seems pleuritic in nature, CTA with volume overload. Will plan for further UF with HD and see if this helps pain.  ESRD -  on HD MWF. Not reaching EDW and signs off early in outpatient. Given CP and volume overload, s/p HD 1/15. Remains volume overloaded, further UF with HD today Hypertension/volume  - BP meds resumed, UF as tolerated with HD as above Anemia of CKD - ESA not due yet. Last given 1/12 in outpatient. Hgb 8.9- declined post op Secondary Hyperparathyroidism -  calcium  at goal, will check phos Nutrition - Renal diet with fluid restriction    Lucie Collet, PA-C 09/17/2024, 9:50 AM  [facility] Pager: [phone]   "

## 2024-09-17 NOTE — Progress Notes (Addendum)
 "          Linda [name]                                                                              Jara Feider, is a 29 y.o. female, DOB - 1995/12/16, FMW:[id number] Admit date - 09/13/2024    Outpatient Primary MD for the patient is [name], NP  LOS - 3  days  Chief Complaint  Patient presents with   Post-op Problem   Arm Pain   Emesis   Nausea   knots in my thighs       Brief summary   Patient is a 29 year old female with hypertension, G6PD deficiency, CHF, lupus, ESRD on HD, MWF schedule, history of multiple AV fistula revisions presented with progressively worsening left arm swelling and pain since after recent AV fistula surgery on 09/06/2024 (Dr Serene).  She noted wound dehiscence about 3 days ago prior to admission.  No fever or chills, nausea vomiting or abdominal pain.  Patient is receiving dialysis via right IJ TDC. Vascular surgery was consulted.  Assessment & Plan     Left upper extremity edema and pain with ongoing drainage from incision after recent AV fistula surgery - Vascular surgery following, underwent left arm placed acute basilic fistula ligation, incision and debridement with wound VAC placement on 09/15/2024 - Per vascular surgery, plan to return to the OR on Tuesday, 09/19/2024 for possible closure  Chest pain with mildly elevated troponin -Resolved.  Last 2D echo 03/06/2024 showed EF of 30 to 35% with global hypokinesis, normal RV SF - Seen by cardiology, chest pain not anginal, troponins flat - CXR 1/17 showed increasing dense left lower lobe consolidation, continue IV vancomycin , cefepime .  Procalcitonin 1.5, leukocytosis trending up, follow blood cultures.  - CTA chest did not show acute PE.  Bibasilar consolidation which may represent atelectasis, aspiration or pneumonia, diffuse ground glass opacities, pulmonary edema, moderate pericardial effusion -2D echo pending - Plan for HD today for volume overload    ESRD on HD  MWF -Currently receiving dialysis treatments via right IJ TDC, last HD on 1/14 - CTA chest with volume overload, renal planning for hemodialysis  Acute on Chronic HFrEF with volume overload -2D echo 02/2024 showed EF 30 to 35%, moderate biatrial dilation, mild mitral regurgitation, and moderate tricuspid regurgitation.   - volume management with HD.  -2D echo pending   Hypertension - Antihypertensives were placed on hold due to lower BP, now resumed. -  On hemodialysis, likely does not need Lasix , will defer to nephrology  GERD Continue PPI and sucralfate .   Anemia of chronic disease H&H stable, baseline 9-10 - Hb stable  Hyponatremia  - likely due to volume overload - management with HD per renal    Estimated body mass index is 25.3 kg/m as calculated from the following:   Height as of this encounter: 5' 6 (1.676 m).   Weight as of this encounter: 71.1 kg.  Code Status: Full code DVT Prophylaxis:  SCDs Start: 09/13/24 2127   Level of Care: Level of care: Telemetry Family Communication: Updated patient Disposition Plan:      Remains inpatient appropriate:      Procedures:  Left arm brachiobasilic fistula ligation Incision and debridement 38 cm Kerecis biologic substitute placement Vacuum-assisted dressing  Consultants:   Nephrology Vascular surgery Cardiology  Antimicrobials:   Anti-infectives (From admission, onward)    Start     Dose/Rate Route Frequency Ordered Stop   09/18/24 1200  vancomycin  (VANCOREADY) IVPB 750 mg/150 mL        750 mg 150 mL/hr over 60 Minutes Intravenous Every M-W-F (Hemodialysis) 09/16/24 1008     09/16/24 1200  ceFEPIme  (MAXIPIME ) 1 g in sodium chloride  0.9 % 100 mL IVPB        1 g 200 mL/hr over 30 Minutes Intravenous Every 24 hours 09/16/24 1006     09/16/24 1100  vancomycin  (VANCOREADY) IVPB 1250 mg/250 mL        1,250 mg 166.7 mL/hr over 90 Minutes Intravenous  Once 09/16/24 1008 09/16/24 1237          Medications   amLODipine   10 mg Oral QHS   carvedilol   25 mg Oral BID WC   Chlorhexidine  Gluconate Cloth  6 each Topical Q0600   Chlorhexidine  Gluconate Cloth  6 each Topical Q0600   cloNIDine   0.1 mg Oral Daily   furosemide   80 mg Oral Daily   irbesartan   300 mg Oral Daily   mupirocin  ointment  1 Application Nasal BID   pantoprazole   40 mg Oral Daily   sucralfate   1 g Oral TID WC & HS      Subjective:   Linda [name] was seen and examined today.  uncomfortable, continues to have chest pain abdominal pain, left arm pain.  No fevers, nausea or vomiting.   Objective:   Vitals:   09/17/24 0805 09/17/24 0924 09/17/24 0924 09/17/24 0930  BP: (!) 148/66 (!) 155/108  (!) 155/108  Pulse: 64 71 72 77  Resp:    18  Temp:  97.9 F (36.6 C)  97.9 F (36.6 C)  TempSrc:    Oral  SpO2: 95% 100% 100% 100%  Weight:      Height:        Intake/Output Summary (Last 24 hours) at 09/17/2024 1257 Last data filed at 09/17/2024 0900 Gross per 24 hour  Intake 750 ml  Output 0 ml  Net 750 ml     Wt Readings from Last 3 Encounters:  09/16/24 71.1 kg  09/06/24 70.3 kg  07/31/24 73.9 kg   Physical Exam General: Alert and oriented x 3, uncomfortable with pain, feeling miserable. Cardiovascular: S1 S2 clear, RRR.  Respiratory: Diminished breath sounds at the bases Gastrointestinal: Soft, nontender, nondistended, NBS Ext: + pedal edema bilaterally Neuro: no new deficits Skin: Left arm wrapped with wound VAC Psych: Uncomfortable   Data Reviewed:  I have personally reviewed following labs    CBC Lab Results  Component Value Date   WBC 18.7 (H) 09/17/2024   RBC 3.14 (L) 09/17/2024   HGB 8.9 (L) 09/17/2024   HCT 29.0 (L) 09/17/2024   MCV 92.4 09/17/2024   MCH 28.3 09/17/2024   PLT 260 09/17/2024   MCHC 30.7 09/17/2024   RDW 21.5 (H) 09/17/2024   LYMPHSABS 2.0 09/13/2024   MONOABS 1.2 (H) 09/13/2024   EOSABS 0.1 09/13/2024   BASOSABS 0.0 09/13/2024     Last metabolic panel Lab Results   Component Value Date   NA 129 (L) 09/17/2024   K 3.8 09/17/2024   CL 91 (L) 09/17/2024   CO2 23 09/17/2024   BUN 38 (H) 09/17/2024   CREATININE 5.61 (H) 09/17/2024  GLUCOSE 139 (H) 09/17/2024   GFRNONAA 10 (L) 09/17/2024   GFRAA 19 (L) 03/27/2019   CALCIUM  9.6 09/17/2024   PHOS 9.1 (H) 05/31/2024   PROT 7.9 09/13/2024   ALBUMIN  3.3 09/13/2024   BILITOT 1.1 09/13/2024   ALKPHOS 174 09/13/2024   AST 26 09/13/2024   ALT 7 09/13/2024   ANIONGAP 15 09/17/2024    CBG (last 3)  No results for input(s): GLUCAP in the last 72 hours.    Coagulation Profile: No results for input(s): INR, PROTIME in the last 168 hours.   Radiology Studies: I have personally reviewed the imaging studies  CT Angio Chest Pulmonary Embolism (PE) W or WO Contrast Result Date: 09/16/2024 EXAM: CTA CHEST 09/16/2024 03:39:59 PM TECHNIQUE: CTA of the chest was performed without and with the administration of 75 mL of iohexol  (OMNIPAQUE ) 350 MG/ML intravenous contrast. Multiplanar reformatted images are provided for review. MIP images are provided for review. Automated exposure control, iterative reconstruction, and/or weight based adjustment of the mA/kV was utilized to reduce the radiation dose to as low as reasonably achievable. COMPARISON: None available. CLINICAL HISTORY: Pulmonary embolism (PE) suspected, high prob. FINDINGS: PULMONARY ARTERIES: Pulmonary arteries are adequately opacified for evaluation. No acute pulmonary embolism. Main pulmonary artery is normal in caliber. MEDIASTINUM: Moderate pericardial effusion. Effusion measures approximately 12 mm along the left heart border and 20 mm along the right atrium. There is no acute abnormality of the thoracic aorta. LYMPH NODES: No mediastinal, hilar or axillary lymphadenopathy. LUNGS AND PLEURA: There is dense airspace disease within the left and right lower lobes, greater on the left. Bibasilar consolidation representing atelectasis, aspiration,  pneumonia. There is underlying diffuse ground glass airspace disease. Diffuse ground glass airspace disease suggests pulmonary edema. No evidence of pleural effusion or pneumothorax. UPPER ABDOMEN: Limited images of the upper abdomen are unremarkable. SOFT TISSUES AND BONES: No acute bone or soft tissue abnormality. IMPRESSION: 1. No acute pulmonary embolism. 2. Bibasilar consolidation, which may represent atelectasis, aspiration, or pneumonia. 3. Diffuse ground-glass opacities, which may represent pulmonary edema. 4. Moderate pericardial effusion. Electronically signed by: Norleen Boxer MD 09/16/2024 04:06 PM EST RP Workstation: [id number]   DG Chest 1 View Result Date: 09/16/2024 EXAM: 1 VIEW(S) XRAY OF THE CHEST 09/16/2024 09:05:00 AM COMPARISON: 09/14/2024 CLINICAL HISTORY: Chest pain FINDINGS: LINES, TUBES AND DEVICES: Stable right IJ dialysis catheter. LUNGS AND PLEURA: Continued low lung volumes. Resolved interstitial edema. Increased dense left lower lobe consolidation. Small pleural effusions suspected. No pneumothorax. HEART AND MEDIASTINUM: Stable moderate to severe cardiomegaly. BONES AND SOFT TISSUES: No acute osseous abnormality. IMPRESSION: 1. Increased dense left lower lobe consolidation. 2. Small pleural effusions suspected. Electronically signed by: Waddell Calk MD 09/16/2024 09:18 AM EST RP Workstation: [id number]       Nydia Distance M.D. Linda [name] 09/17/2024, 12:57 PM  Available via Epic secure chat 7am-7pm After 7 pm, please refer to night coverage provider listed on amion.    "

## 2024-09-17 NOTE — Progress Notes (Signed)
" °  Echocardiogram 2D Echocardiogram has been performed.  Koleen KANDICE Popper, RDCS 09/17/2024, 1:29 PM "

## 2024-09-17 NOTE — Plan of Care (Signed)
" °  Problem: Education: Goal: Knowledge of General Education information will improve Description: Including pain rating scale, medication(s)/side effects and non-pharmacologic comfort measures Outcome: Progressing   Problem: Health Behavior/Discharge Planning: Goal: Ability to manage health-related needs will improve Outcome: Progressing   Problem: Clinical Measurements: Goal: Ability to maintain clinical measurements within normal limits will improve Outcome: Progressing Goal: Will remain free from infection Outcome: Progressing Goal: Diagnostic test results will improve Outcome: Progressing Goal: Respiratory complications will improve Outcome: Progressing Goal: Cardiovascular complication will be avoided Outcome: Progressing   Problem: Activity: Goal: Risk for activity intolerance will decrease Outcome: Progressing   Problem: Nutrition: Goal: Adequate nutrition will be maintained Outcome: Progressing   Problem: Coping: Goal: Level of anxiety will decrease Outcome: Progressing   Problem: Elimination: Goal: Will not experience complications related to bowel motility Outcome: Progressing Goal: Will not experience complications related to urinary retention Outcome: Progressing   Problem: Pain Managment: Goal: General experience of comfort will improve and/or be controlled Outcome: Progressing   Problem: Safety: Goal: Ability to remain free from injury will improve Outcome: Progressing   Problem: Skin Integrity: Goal: Risk for impaired skin integrity will decrease Outcome: Progressing   Problem: Education: Goal: Knowledge of the prescribed therapeutic regimen will improve Outcome: Progressing   Problem: Bowel/Gastric: Goal: Gastrointestinal status for postoperative course will improve Outcome: Progressing   Problem: Cardiac: Goal: Ability to maintain an adequate cardiac output Outcome: Progressing Goal: Will show no evidence of cardiac arrhythmias Outcome:  Progressing   Problem: Nutritional: Goal: Will attain and maintain optimal nutritional status Outcome: Progressing   Problem: Neurological: Goal: Will regain or maintain usual level of consciousness Outcome: Progressing   Problem: Clinical Measurements: Goal: Ability to maintain clinical measurements within normal limits Outcome: Progressing Goal: Postoperative complications will be avoided or minimized Outcome: Progressing   Problem: Respiratory: Goal: Will regain and/or maintain adequate ventilation Outcome: Progressing Goal: Respiratory status will improve Outcome: Progressing   Problem: Skin Integrity: Goal: Demonstrates signs of wound healing without infection Outcome: Progressing   Problem: Urinary Elimination: Goal: Will remain free from infection Outcome: Progressing Goal: Ability to achieve and maintain adequate urine output Outcome: Progressing   "

## 2024-09-17 NOTE — Progress Notes (Addendum)
 "   Progress Note  Patient Name: Linda [name] Date of Encounter: 09/17/2024  Primary Cardiologist:   None   Subjective   She looks uncomfortable.  Pleuritic type chest pain  Inpatient Medications    Scheduled Meds:  amLODipine   10 mg Oral QHS   carvedilol   25 mg Oral BID WC   Chlorhexidine  Gluconate Cloth  6 each Topical Q0600   Chlorhexidine  Gluconate Cloth  6 each Topical Q0600   cloNIDine   0.1 mg Oral Daily   furosemide   80 mg Oral Daily   irbesartan   300 mg Oral Daily   mupirocin  ointment  1 Application Nasal BID   pantoprazole   40 mg Oral Daily   sucralfate   1 g Oral TID WC & HS   Continuous Infusions:  ceFEPime  (MAXIPIME ) IV Stopped (09/16/24 1323)   [START ON 09/18/2024] vancomycin      PRN Meds: hydrALAZINE , HYDROmorphone  (DILAUDID ) injection, naLOXone  (NARCAN )  injection, nitroGLYCERIN , oxyCODONE    Vital Signs    Vitals:   09/17/24 0805 09/17/24 0924 09/17/24 0924 09/17/24 0930  BP: (!) 148/66 (!) 155/108  (!) 155/108  Pulse: 64 71 72 77  Resp:    18  Temp:  97.9 F (36.6 C)  97.9 F (36.6 C)  TempSrc:    Oral  SpO2: 95% 100% 100% 100%  Weight:      Height:        Intake/Output Summary (Last 24 hours) at 09/17/2024 1134 Last data filed at 09/17/2024 0900 Gross per 24 hour  Intake 750 ml  Output 0 ml  Net 750 ml   Filed Weights   09/15/24 0835 09/15/24 2030 09/16/24 0030  Weight: 74.1 kg 74.1 kg 71.1 kg    Telemetry    NSR - Personally Reviewed  ECG    NA   Physical Exam   GEN:    Moderately uncomfortable appearing in no acute distress.   Neck: No  JVD Cardiac: RRR, to and fro pansystolic murmur, positive three component rub  Respiratory:    Decreased breath sounds at both bases GI: Soft, nontender, non-distended, normal bowel sounds  MS:  Mild bilateral tense leg  edema; No deformity. Neuro:   Nonfocal  Psych: Oriented and appropriate     Labs    Chemistry Recent Labs  Lab 09/13/24 1454 09/14/24 0411 09/15/24 0850  09/16/24 0550 09/17/24 0404  NA 134 134* 131* 129* 129*  K 4.1 3.9 3.8 3.8 3.8  CL 93 95* 93* 92* 91*  CO2 25 27  --  25 23  GLUCOSE 82 100* 101* 112* 139*  BUN 22 27* 24* 19 38*  CREATININE 5.56 6.35* 5.30* 3.76* 5.61*  CALCIUM  9.0 8.2*  --  8.6* 9.6  PROT 7.9  --   --   --   --   ALBUMIN  3.3  --   --   --   --   AST 26  --   --   --   --   ALT 7  --   --   --   --   ALKPHOS 174  --   --   --   --   BILITOT 1.1  --   --   --   --   GFRNONAA 10 8*  --  16* 10*  ANIONGAP 16 13  --  12 15     Hematology Recent Labs  Lab 09/14/24 0411 09/15/24 0850 09/16/24 0550 09/17/24 0404  WBC 7.8  --  15.5* 18.7*  RBC 2.96*  --  3.09* 3.14*  HGB 8.3* 10.5* 8.5* 8.9*  HCT 28.0* 31.0* 28.5* 29.0*  MCV 94.6  --  92.2 92.4  MCH 28.0  --  27.5 28.3  MCHC 29.6*  --  29.8* 30.7  RDW 22.3*  --  22.3* 21.5*  PLT 289  --  249 260    Cardiac EnzymesNo results for input(s): TROPONINI in the last 168 hours. No results for input(s): TROPIPOC in the last 168 hours.   BNPNo results for input(s): BNP, PROBNP in the last 168 hours.   DDimer No results for input(s): DDIMER in the last 168 hours.   Radiology    CT Angio Chest Pulmonary Embolism (PE) W or WO Contrast Result Date: 09/16/2024 EXAM: CTA CHEST 09/16/2024 03:39:59 PM TECHNIQUE: CTA of the chest was performed without and with the administration of 75 mL of iohexol  (OMNIPAQUE ) 350 MG/ML intravenous contrast. Multiplanar reformatted images are provided for review. MIP images are provided for review. Automated exposure control, iterative reconstruction, and/or weight based adjustment of the mA/kV was utilized to reduce the radiation dose to as low as reasonably achievable. COMPARISON: None available. CLINICAL HISTORY: Pulmonary embolism (PE) suspected, high prob. FINDINGS: PULMONARY ARTERIES: Pulmonary arteries are adequately opacified for evaluation. No acute pulmonary embolism. Main pulmonary artery is normal in caliber. MEDIASTINUM:  Moderate pericardial effusion. Effusion measures approximately 12 mm along the left heart border and 20 mm along the right atrium. There is no acute abnormality of the thoracic aorta. LYMPH NODES: No mediastinal, hilar or axillary lymphadenopathy. LUNGS AND PLEURA: There is dense airspace disease within the left and right lower lobes, greater on the left. Bibasilar consolidation representing atelectasis, aspiration, pneumonia. There is underlying diffuse ground glass airspace disease. Diffuse ground glass airspace disease suggests pulmonary edema. No evidence of pleural effusion or pneumothorax. UPPER ABDOMEN: Limited images of the upper abdomen are unremarkable. SOFT TISSUES AND BONES: No acute bone or soft tissue abnormality. IMPRESSION: 1. No acute pulmonary embolism. 2. Bibasilar consolidation, which may represent atelectasis, aspiration, or pneumonia. 3. Diffuse ground-glass opacities, which may represent pulmonary edema. 4. Moderate pericardial effusion. Electronically signed by: Norleen Boxer MD 09/16/2024 04:06 PM EST RP Workstation: [id number]   DG Chest 1 View Result Date: 09/16/2024 EXAM: 1 VIEW(S) XRAY OF THE CHEST 09/16/2024 09:05:00 AM COMPARISON: 09/14/2024 CLINICAL HISTORY: Chest pain FINDINGS: LINES, TUBES AND DEVICES: Stable right IJ dialysis catheter. LUNGS AND PLEURA: Continued low lung volumes. Resolved interstitial edema. Increased dense left lower lobe consolidation. Small pleural effusions suspected. No pneumothorax. HEART AND MEDIASTINUM: Stable moderate to severe cardiomegaly. BONES AND SOFT TISSUES: No acute osseous abnormality. IMPRESSION: 1. Increased dense left lower lobe consolidation. 2. Small pleural effusions suspected. Electronically signed by: Waddell Calk MD 09/16/2024 09:18 AM EST RP Workstation: [id number]    Cardiac Studies   Echo pending.   Patient Profile     29 y.o. female with ESRD, DCM being evaluated for chest pain and volume overload.    Assessment & Plan     Chest pain:    Non anginal chest pain. No ischemia work up.  No evidence of acute ischemia work up.  Moderate effusion noted on CT (usually over estimate).  Will follow up echo today.  Will repeat an echo looking for evidence of pericarditis not evident on EKG a few days ago     CHF:   She is going to have dialysis today with volume overload.   Echo is pending.          Net negative 3 liters  with dialysis on Friday.  Still with SOB and being treated for possible pneumonia.  CTA pending to rule out PE.   Will order echo given continued issues with SOB and acute systolic HF.    VVS:    Status post I/D of left arm brachiobasilic fistula.  Plan is OR early next week to close the wound.    CRF:  Dialysis as above.   Addendum:  Moderate pericardial effusion posterior.  EF if 40 45%.  EKG without acute ST elevation.  Pain could be pericarditis although EKG not suggestive.  She has NSAIDs listed as an allergy.  If pain not improved with pain management and increased dialysis as planned consider steroid taper.    For questions or updates, please contact [facility] Please consult [email or web address] for contact info under Cardiology/STEMI.   Signed, Lynwood Schilling, MD  09/17/2024, 11:34 AM    "

## 2024-09-18 ENCOUNTER — Ambulatory Visit

## 2024-09-18 ENCOUNTER — Encounter (HOSPITAL_COMMUNITY): Payer: Self-pay | Admitting: Vascular Surgery

## 2024-09-18 DIAGNOSIS — K219 Gastro-esophageal reflux disease without esophagitis: Secondary | ICD-10-CM | POA: Diagnosis not present

## 2024-09-18 DIAGNOSIS — I5021 Acute systolic (congestive) heart failure: Secondary | ICD-10-CM | POA: Diagnosis not present

## 2024-09-18 DIAGNOSIS — I3139 Other pericardial effusion (noninflammatory): Secondary | ICD-10-CM | POA: Diagnosis not present

## 2024-09-18 DIAGNOSIS — R7989 Other specified abnormal findings of blood chemistry: Secondary | ICD-10-CM | POA: Diagnosis not present

## 2024-09-18 DIAGNOSIS — I10 Essential (primary) hypertension: Secondary | ICD-10-CM | POA: Diagnosis not present

## 2024-09-18 DIAGNOSIS — M7989 Other specified soft tissue disorders: Secondary | ICD-10-CM | POA: Diagnosis not present

## 2024-09-18 DIAGNOSIS — N186 End stage renal disease: Secondary | ICD-10-CM | POA: Diagnosis not present

## 2024-09-18 LAB — BASIC METABOLIC PANEL WITH GFR
Anion gap: 16 — ABNORMAL HIGH (ref 5–15)
BUN: 52 mg/dL — ABNORMAL HIGH (ref 6–20)
CO2: 24 mmol/L (ref 22–32)
Calcium: 9.5 mg/dL (ref 8.9–10.3)
Chloride: 89 mmol/L — ABNORMAL LOW (ref 98–111)
Creatinine, Ser: 6.83 mg/dL — ABNORMAL HIGH (ref 0.44–1.00)
GFR, Estimated: 8 mL/min — ABNORMAL LOW
Glucose, Bld: 105 mg/dL — ABNORMAL HIGH (ref 70–99)
Potassium: 3.7 mmol/L (ref 3.5–5.1)
Sodium: 129 mmol/L — ABNORMAL LOW (ref 135–145)

## 2024-09-18 LAB — CULTURE, BLOOD (ROUTINE X 2): Culture: NO GROWTH

## 2024-09-18 LAB — CBC
HCT: 29.7 % — ABNORMAL LOW (ref 36.0–46.0)
Hemoglobin: 9 g/dL — ABNORMAL LOW (ref 12.0–15.0)
MCH: 28 pg (ref 26.0–34.0)
MCHC: 30.3 g/dL (ref 30.0–36.0)
MCV: 92.5 fL (ref 80.0–100.0)
Platelets: 303 K/uL (ref 150–400)
RBC: 3.21 MIL/uL — ABNORMAL LOW (ref 3.87–5.11)
RDW: 21.4 % — ABNORMAL HIGH (ref 11.5–15.5)
WBC: 27.3 K/uL — ABNORMAL HIGH (ref 4.0–10.5)
nRBC: 0.1 % (ref 0.0–0.2)

## 2024-09-18 MED ORDER — HEPARIN SODIUM (PORCINE) 1000 UNIT/ML IJ SOLN
INTRAMUSCULAR | Status: AC
  Filled 2024-09-18: qty 4

## 2024-09-18 MED ORDER — HYDROMORPHONE HCL 1 MG/ML IJ SOLN
INTRAMUSCULAR | Status: AC
  Filled 2024-09-18: qty 1

## 2024-09-18 MED ORDER — CEFAZOLIN SODIUM-DEXTROSE 1-4 GM/50ML-% IV SOLN
1.0000 g | INTRAVENOUS | Status: AC
  Administered 2024-09-19 (×2): 1 g via INTRAVENOUS
  Filled 2024-09-18: qty 50

## 2024-09-18 MED ORDER — VANCOMYCIN HCL 750 MG/150ML IV SOLN
INTRAVENOUS | Status: AC
  Filled 2024-09-18: qty 150

## 2024-09-18 MED ORDER — HEPARIN SODIUM (PORCINE) 1000 UNIT/ML IJ SOLN
3800.0000 [IU] | Freq: Once | INTRAMUSCULAR | Status: AC
  Administered 2024-09-18: 3800 [IU]

## 2024-09-18 NOTE — Progress Notes (Signed)
 "   Progress Note  Patient Name: Linda [name] Date of Encounter: 09/18/2024  Primary Cardiologist:   None   Subjective   Chest pain better today- did not have dialysis yesterday, plan for today. She is coughing up more. Echo yesterday showed LVEF 40-45% with severe LVH, global hypokinesis and grade 2 DD. Mild RV Systolic dysfunction. There is a moderate pericardial effusion which is localized (personally reviewed) - no tamponade features. Compared to 02/2024, the LVEF has improved from 30-35%  Inpatient Medications    Scheduled Meds:  amLODipine   10 mg Oral QHS   carvedilol   25 mg Oral BID WC   Chlorhexidine  Gluconate Cloth  6 each Topical Q0600   Chlorhexidine  Gluconate Cloth  6 each Topical Q0600   cloNIDine   0.1 mg Oral Daily   furosemide   80 mg Oral Daily   irbesartan   300 mg Oral Daily   mupirocin  ointment  1 Application Nasal BID   pantoprazole   40 mg Oral Daily   sucralfate   1 g Oral TID WC & HS   Continuous Infusions:  [START ON 09/19/2024]  ceFAZolin  (ANCEF ) IV     ceFEPime  (MAXIPIME ) IV 1 g (09/17/24 1237)   vancomycin      PRN Meds: hydrALAZINE , HYDROmorphone  (DILAUDID ) injection, naLOXone  (NARCAN )  injection, nitroGLYCERIN , oxyCODONE    Vital Signs    Vitals:   09/17/24 1726 09/17/24 1935 09/18/24 0520 09/18/24 0745  BP: (!) 145/109 (!) 149/107 (!) 134/95 (!) 137/105  Pulse: 82 80 67 72  Resp: 19 18 18 18   Temp: 98.4 F (36.9 C) 97.7 F (36.5 C) 97.7 F (36.5 C) 97.8 F (36.6 C)  TempSrc:  Oral Oral   SpO2: 100% 93% 98% 100%  Weight:      Height:        Intake/Output Summary (Last 24 hours) at 09/18/2024 0827 Last data filed at 09/17/2024 1934 Gross per 24 hour  Intake 1700 ml  Output 0 ml  Net 1700 ml   Filed Weights   09/15/24 0835 09/15/24 2030 09/16/24 0030  Weight: 74.1 kg 74.1 kg 71.1 kg    Telemetry    NSR - Personally Reviewed  ECG    NA   Physical Exam   GEN:   Awake, NAD Neck: No  JVD Cardiac: RRR, multi component  rub noted Respiratory:    Decreased breath sounds at both bases, intermittent rhonchi GI: Soft, nontender, non-distended, normal bowel sounds  MS:  Mild bilateral tense leg  edema; No deformity. Neuro:   Nonfocal  Psych: Oriented and appropriate   Labs    Chemistry Recent Labs  Lab 09/13/24 1454 09/14/24 0411 09/16/24 0550 09/17/24 0404 09/18/24 0338  NA 134   < > 129* 129* 129*  K 4.1   < > 3.8 3.8 3.7  CL 93   < > 92* 91* 89*  CO2 25   < > 25 23 24   GLUCOSE 82   < > 112* 139* 105*  BUN 22   < > 19 38* 52*  CREATININE 5.56   < > 3.76* 5.61* 6.83*  CALCIUM  9.0   < > 8.6* 9.6 9.5  PROT 7.9  --   --   --   --   ALBUMIN  3.3  --   --   --   --   AST 26  --   --   --   --   ALT 7  --   --   --   --   ALKPHOS 174  --   --   --   --  BILITOT 1.1  --   --   --   --   GFRNONAA 10   < > 16* 10* 8*  ANIONGAP 16   < > 12 15 16*   < > = values in this interval not displayed.     Hematology Recent Labs  Lab 09/16/24 0550 09/17/24 0404 09/18/24 0338  WBC 15.5* 18.7* 27.3*  RBC 3.09* 3.14* 3.21*  HGB 8.5* 8.9* 9.0*  HCT 28.5* 29.0* 29.7*  MCV 92.2 92.4 92.5  MCH 27.5 28.3 28.0  MCHC 29.8* 30.7 30.3  RDW 22.3* 21.5* 21.4*  PLT 249 260 303    Cardiac EnzymesNo results for input(s): TROPONINI in the last 168 hours. No results for input(s): TROPIPOC in the last 168 hours.   BNPNo results for input(s): BNP, PROBNP in the last 168 hours.   DDimer No results for input(s): DDIMER in the last 168 hours.   Radiology    ECHOCARDIOGRAM COMPLETE Result Date: 09/17/2024    ECHOCARDIOGRAM REPORT   Patient Name:   Linda [name] Date of Exam: 09/17/2024 Medical Rec #:  [id number]             Height:       66.1 in Accession #:    [id number]            Weight:       156.7 lb Date of Birth:  1995-12-24             BSA:          1.806 m Patient Age:    28 years              BP:           150/110 mmHg Patient Gender: F                     HR:           82 bpm. Exam Location:   Inpatient Procedure: 2D Echo, 3D Echo, Cardiac Doppler and Color Doppler (Both Spectral            and Color Flow Doppler were utilized during procedure). Indications:    CHF- Acute Systolic I50.21  History:        Patient has prior history of Echocardiogram examinations, most                 recent 03/06/2024. HFrEF; Risk Factors:Hypertension and ESRD.  Sonographer:    Koleen Popper RDCS Referring Phys: LYNWOOD SCHILLING IMPRESSIONS  1. Left ventricular ejection fraction, by estimation, is 40 to 45%. Left ventricular ejection fraction by 3D volume is 40 %. The left ventricle has mildly decreased function. The left ventricle demonstrates global hypokinesis. There is severe concentric  left ventricular hypertrophy. Left ventricular diastolic parameters are consistent with Grade II diastolic dysfunction (pseudonormalization).  2. Right ventricular systolic function is mildly reduced. The right ventricular size is normal. There is moderately elevated pulmonary artery systolic pressure. The estimated right ventricular systolic pressure is 45.7 mmHg.  3. Left atrial size was mildly dilated.  4. Moderate pericardial effusion. The pericardial effusion is posterior to the left ventricle and localized near the right atrium. There is no evidence of cardiac tamponade.  5. The mitral valve is grossly normal. Mild mitral valve regurgitation. No evidence of mitral stenosis.  6. The aortic valve is tricuspid. Aortic valve regurgitation is not visualized. No aortic stenosis is present.  7. The inferior vena cava is dilated in size with >50%  respiratory variability, suggesting right atrial pressure of 8 mmHg. Comparison(s): Changes from prior study are noted. The left ventricular function has improved. FINDINGS  Left Ventricle: Left ventricular ejection fraction, by estimation, is 40 to 45%. Left ventricular ejection fraction by 3D volume is 40 %. The left ventricle has mildly decreased function. The left ventricle demonstrates global  hypokinesis. The left ventricular internal cavity size was normal in size. There is severe concentric left ventricular hypertrophy. Left ventricular diastolic parameters are consistent with Grade II diastolic dysfunction (pseudonormalization). Right Ventricle: The right ventricular size is normal. No increase in right ventricular wall thickness. Right ventricular systolic function is mildly reduced. There is moderately elevated pulmonary artery systolic pressure. The tricuspid regurgitant velocity is 3.07 m/s, and with an assumed right atrial pressure of 8 mmHg, the estimated right ventricular systolic pressure is 45.7 mmHg. Left Atrium: Left atrial size was mildly dilated. Right Atrium: Right atrial size was normal in size. Pericardium: A moderately sized pericardial effusion is present. The pericardial effusion is posterior to the left ventricle and localized near the right atrium. There is no evidence of cardiac tamponade. Mitral Valve: The mitral valve is grossly normal. Mild mitral valve regurgitation. No evidence of mitral valve stenosis. Tricuspid Valve: The tricuspid valve is grossly normal. Tricuspid valve regurgitation is mild . No evidence of tricuspid stenosis. Aortic Valve: The aortic valve is tricuspid. Aortic valve regurgitation is not visualized. No aortic stenosis is present. Pulmonic Valve: The pulmonic valve was grossly normal. Pulmonic valve regurgitation is mild. No evidence of pulmonic stenosis. Aorta: The aortic root and ascending aorta are structurally normal, with no evidence of dilitation. Venous: The inferior vena cava is dilated in size with greater than 50% respiratory variability, suggesting right atrial pressure of 8 mmHg. IAS/Shunts: The atrial septum is grossly normal. Additional Comments: 3D was performed not requiring image post processing on an independent workstation and was abnormal.  LEFT VENTRICLE PLAX 2D LVIDd:         5.20 cm         Diastology LVIDs:         3.90 cm          LV e' medial:    4.87 cm/s LV PW:         1.70 cm         LV E/e' medial:  21.6 LV IVS:        1.50 cm         LV e' lateral:   4.87 cm/s LVOT diam:     1.90 cm         LV E/e' lateral: 21.6 LV SV:         61 LV SV Index:   34 LVOT Area:     2.84 cm        3D Volume EF                                LV 3D EF:    Left                                             ventricul LV Volumes (MOD)                            ar LV  vol d, MOD    228.0 ml                   ejection A2C:                                        fraction LV vol d, MOD    238.0 ml                   by 3D A4C:                                        volume is LV vol s, MOD    138.0 ml                   40 %. A2C: LV vol s, MOD    134.0 ml A4C:                           3D Volume EF: LV SV MOD A2C:   90.0 ml       3D EF:        40 % LV SV MOD A4C:   238.0 ml      LV EDV:       257 ml LV SV MOD BP:    99.0 ml       LV ESV:       154 ml                                LV SV:        104 ml RIGHT VENTRICLE             IVC RV Basal diam:  4.40 cm     IVC diam: 2.70 cm RV Mid diam:    3.20 cm RV S prime:     11.90 cm/s TAPSE (M-mode): 2.2 cm LEFT ATRIUM             Index        RIGHT ATRIUM           Index LA diam:        5.00 cm 2.77 cm/m   RA Area:     21.40 cm LA Vol (A2C):   60.3 ml 33.39 ml/m  RA Volume:   62.00 ml  34.33 ml/m LA Vol (A4C):   67.7 ml 37.48 ml/m LA Biplane Vol: 65.6 ml 36.32 ml/m  AORTIC VALVE             PULMONIC VALVE LVOT Vmax:   122.00 cm/s PR End Diast Vel: 2.42 msec LVOT Vmean:  77.200 cm/s LVOT VTI:    0.214 m  AORTA Ao Root diam: 3.20 cm Ao Asc diam:  3.20 cm MITRAL VALVE                TRICUSPID VALVE MV Area (PHT): 2.83 cm     TR Peak grad:   37.7 mmHg MV Decel Time: 268 msec     TR Mean grad:   25.0 mmHg MR Peak grad: 53.9 mmHg     TR Vmax:        307.00 cm/s MR Vmax:      367.00 cm/s  TR Vmean:       240.0 cm/s MV E velocity: 105.00 cm/s MV A velocity: 67.30 cm/s   SHUNTS MV E/A ratio:  1.56         Systemic VTI:  0.21  m                             Systemic Diam: 1.90 cm Darryle Decent MD Electronically signed by Darryle Decent MD Signature Date/Time: 09/17/2024/2:27:25 PM    Final    CT Angio Chest Pulmonary Embolism (PE) W or WO Contrast Result Date: 09/16/2024 EXAM: CTA CHEST 09/16/2024 03:39:59 PM TECHNIQUE: CTA of the chest was performed without and with the administration of 75 mL of iohexol  (OMNIPAQUE ) 350 MG/ML intravenous contrast. Multiplanar reformatted images are provided for review. MIP images are provided for review. Automated exposure control, iterative reconstruction, and/or weight based adjustment of the mA/kV was utilized to reduce the radiation dose to as low as reasonably achievable. COMPARISON: None available. CLINICAL HISTORY: Pulmonary embolism (PE) suspected, high prob. FINDINGS: PULMONARY ARTERIES: Pulmonary arteries are adequately opacified for evaluation. No acute pulmonary embolism. Main pulmonary artery is normal in caliber. MEDIASTINUM: Moderate pericardial effusion. Effusion measures approximately 12 mm along the left heart border and 20 mm along the right atrium. There is no acute abnormality of the thoracic aorta. LYMPH NODES: No mediastinal, hilar or axillary lymphadenopathy. LUNGS AND PLEURA: There is dense airspace disease within the left and right lower lobes, greater on the left. Bibasilar consolidation representing atelectasis, aspiration, pneumonia. There is underlying diffuse ground glass airspace disease. Diffuse ground glass airspace disease suggests pulmonary edema. No evidence of pleural effusion or pneumothorax. UPPER ABDOMEN: Limited images of the upper abdomen are unremarkable. SOFT TISSUES AND BONES: No acute bone or soft tissue abnormality. IMPRESSION: 1. No acute pulmonary embolism. 2. Bibasilar consolidation, which may represent atelectasis, aspiration, or pneumonia. 3. Diffuse ground-glass opacities, which may represent pulmonary edema. 4. Moderate pericardial effusion.  Electronically signed by: Norleen Boxer MD 09/16/2024 04:06 PM EST RP Workstation: [id number]   DG Chest 1 View Result Date: 09/16/2024 EXAM: 1 VIEW(S) XRAY OF THE CHEST 09/16/2024 09:05:00 AM COMPARISON: 09/14/2024 CLINICAL HISTORY: Chest pain FINDINGS: LINES, TUBES AND DEVICES: Stable right IJ dialysis catheter. LUNGS AND PLEURA: Continued low lung volumes. Resolved interstitial edema. Increased dense left lower lobe consolidation. Small pleural effusions suspected. No pneumothorax. HEART AND MEDIASTINUM: Stable moderate to severe cardiomegaly. BONES AND SOFT TISSUES: No acute osseous abnormality. IMPRESSION: 1. Increased dense left lower lobe consolidation. 2. Small pleural effusions suspected. Electronically signed by: Waddell Calk MD 09/16/2024 09:18 AM EST RP Workstation: [id number]    Cardiac Studies   Echo pending.   Patient Profile     29 y.o. female with ESRD, DCM being evaluated for chest pain and volume overload.    Assessment & Plan    Chest pain:   Chest pain improved, suspect this is related to chest consolidation as she says she hears a more loose rattle in her chest today. Volume management per nephrology at dialysis today - this should be further beneficial  CHF:  Echo shows improved LVEF to 40-45%, mild RV dysfunction, moderate focal pericardial effusion - may improve with dialysis today. On allowable GDMT with irbesartan  and furosemide .  VVS:    Status post I/D of left arm brachiobasilic fistula.  Plan is OR tomorrow (1/20) close the wound.    CRF:  Dialysis as above (last on 1/19).  For questions or  updates, please contact [facility] Please consult [email or web address] for contact info under Cardiology/STEMI.   Vinie KYM Maxcy, MD, [facility], FNLA, FACP  [place]  [facility]  Medical Director of the [facility]  [facility] Diplomate of the American Board of Clinical Lipidology Attending Cardiologist  Direct Dial :  [phone]  Fax: [phone]  Website:  [email or web address]  Vinie JAYSON Maxcy, MD  09/18/2024, 8:27 AM    "

## 2024-09-18 NOTE — Progress Notes (Signed)
 " [facility] Progress Note   Subjective:    Seen and examined patient at bedside. She reports still feeling as if she has fluid on her when taking in deep breath but is feeling better. Her scheduled extra HD wasn't done yesterday 2nd high patient census and scheduled for today per usual schedule.  Objective Vitals:   09/17/24 1726 09/17/24 1935 09/18/24 0520 09/18/24 0745  BP: (!) 145/109 (!) 149/107 (!) 134/95 (!) 137/105  Pulse: 82 80 67 72  Resp: 19 18 18 18   Temp: 98.4 F (36.9 C) 97.7 F (36.5 C) 97.7 F (36.5 C) 97.8 F (36.6 C)  TempSrc:  Oral Oral   SpO2: 100% 93% 98% 100%  Weight:      Height:       Physical Exam General: Alert female in NAD Heart: RRR, no murmur Lungs: diminished breath sounds in LLL, clear other lobes no rhonchi/ rales auscultated  Abdomen: Soft, non-distended Extremities: No edema b/l lower extremities Dialysis Access:  L arm wrapped, [facility]  Filed Weights   09/15/24 0835 09/15/24 2030 09/16/24 0030  Weight: 74.1 kg 74.1 kg 71.1 kg    Intake/Output Summary (Last 24 hours) at 09/18/2024 1159 Last data filed at 09/17/2024 1934 Gross per 24 hour  Intake 1300 ml  Output 0 ml  Net 1300 ml    Additional Objective Labs: Basic Metabolic Panel: Recent Labs  Lab 09/16/24 0550 09/17/24 0404 09/18/24 0338  NA 129* 129* 129*  K 3.8 3.8 3.7  CL 92* 91* 89*  CO2 25 23 24   GLUCOSE 112* 139* 105*  BUN 19 38* 52*  CREATININE 3.76* 5.61* 6.83*  CALCIUM  8.6* 9.6 9.5   Liver Function Tests: Recent Labs  Lab 09/13/24 1454  AST 26  ALT 7  ALKPHOS 174  BILITOT 1.1  PROT 7.9  ALBUMIN  3.3   No results for input(s): LIPASE, AMYLASE in the last 168 hours. CBC: Recent Labs  Lab 09/13/24 1454 09/14/24 0411 09/15/24 0850 09/16/24 0550 09/17/24 0404 09/18/24 0338  WBC 9.2 7.8  --  15.5* 18.7* 27.3*  NEUTROABS 5.6  --   --   --   --   --   HGB 9.0* 8.3*   < > 8.5* 8.9* 9.0*  HCT 29.4* 28.0*   < > 28.5* 29.0* 29.7*  MCV  92.7 94.6  --  92.2 92.4 92.5  PLT 292 289  --  249 260 303   < > = values in this interval not displayed.   Blood Culture    Component Value Date/Time   SDES BLOOD RIGHT HAND 09/16/2024 1326   SPECREQUEST  09/16/2024 1326    BOTTLES DRAWN AEROBIC ONLY Blood Culture results may not be optimal due to an inadequate volume of blood received in culture bottles   CULT  09/16/2024 1326    NO GROWTH 2 DAYS Performed at [facility], [address], [place], [place] [address]    REPTSTATUS PENDING 09/16/2024 1326    Cardiac Enzymes: No results for input(s): CKTOTAL, CKMB, CKMBINDEX, TROPONINI in the last 168 hours. CBG: No results for input(s): GLUCAP in the last 168 hours. Iron  Studies: No results for input(s): IRON , TIBC, TRANSFERRIN, FERRITIN in the last 72 hours. Lab Results  Component Value Date   INR 1.1 04/07/2024   INR 1.2 10/10/2023   INR 1.0 03/27/2019   Studies/Results: ECHOCARDIOGRAM COMPLETE Result Date: 09/17/2024    ECHOCARDIOGRAM REPORT   Patient Name:   Linda [name] Date of Exam:  09/17/2024 Medical Rec #:  [id number]             Height:       66.1 in Accession #:    [id number]            Weight:       156.7 lb Date of Birth:  02-08-1996             BSA:          1.806 m Patient Age:    28 years              BP:           150/110 mmHg Patient Gender: F                     HR:           82 bpm. Exam Location:  Inpatient Procedure: 2D Echo, 3D Echo, Cardiac Doppler and Color Doppler (Both Spectral            and Color Flow Doppler were utilized during procedure). Indications:    CHF- Acute Systolic I50.21  History:        Patient has prior history of Echocardiogram examinations, most                 recent 03/06/2024. HFrEF; Risk Factors:Hypertension and ESRD.  Sonographer:    Koleen Popper RDCS Referring Phys: LYNWOOD SCHILLING IMPRESSIONS  1. Left ventricular ejection fraction, by estimation, is 40 to 45%. Left ventricular ejection fraction by 3D  volume is 40 %. The left ventricle has mildly decreased function. The left ventricle demonstrates global hypokinesis. There is severe concentric  left ventricular hypertrophy. Left ventricular diastolic parameters are consistent with Grade II diastolic dysfunction (pseudonormalization).  2. Right ventricular systolic function is mildly reduced. The right ventricular size is normal. There is moderately elevated pulmonary artery systolic pressure. The estimated right ventricular systolic pressure is 45.7 mmHg.  3. Left atrial size was mildly dilated.  4. Moderate pericardial effusion. The pericardial effusion is posterior to the left ventricle and localized near the right atrium. There is no evidence of cardiac tamponade.  5. The mitral valve is grossly normal. Mild mitral valve regurgitation. No evidence of mitral stenosis.  6. The aortic valve is tricuspid. Aortic valve regurgitation is not visualized. No aortic stenosis is present.  7. The inferior vena cava is dilated in size with >50% respiratory variability, suggesting right atrial pressure of 8 mmHg. Comparison(s): Changes from prior study are noted. The left ventricular function has improved. FINDINGS  Left Ventricle: Left ventricular ejection fraction, by estimation, is 40 to 45%. Left ventricular ejection fraction by 3D volume is 40 %. The left ventricle has mildly decreased function. The left ventricle demonstrates global hypokinesis. The left ventricular internal cavity size was normal in size. There is severe concentric left ventricular hypertrophy. Left ventricular diastolic parameters are consistent with Grade II diastolic dysfunction (pseudonormalization). Right Ventricle: The right ventricular size is normal. No increase in right ventricular wall thickness. Right ventricular systolic function is mildly reduced. There is moderately elevated pulmonary artery systolic pressure. The tricuspid regurgitant velocity is 3.07 m/s, and with an assumed right  atrial pressure of 8 mmHg, the estimated right ventricular systolic pressure is 45.7 mmHg. Left Atrium: Left atrial size was mildly dilated. Right Atrium: Right atrial size was normal in size. Pericardium: A moderately sized pericardial effusion is present. The pericardial effusion is posterior to the left ventricle and localized near the  right atrium. There is no evidence of cardiac tamponade. Mitral Valve: The mitral valve is grossly normal. Mild mitral valve regurgitation. No evidence of mitral valve stenosis. Tricuspid Valve: The tricuspid valve is grossly normal. Tricuspid valve regurgitation is mild . No evidence of tricuspid stenosis. Aortic Valve: The aortic valve is tricuspid. Aortic valve regurgitation is not visualized. No aortic stenosis is present. Pulmonic Valve: The pulmonic valve was grossly normal. Pulmonic valve regurgitation is mild. No evidence of pulmonic stenosis. Aorta: The aortic root and ascending aorta are structurally normal, with no evidence of dilitation. Venous: The inferior vena cava is dilated in size with greater than 50% respiratory variability, suggesting right atrial pressure of 8 mmHg. IAS/Shunts: The atrial septum is grossly normal. Additional Comments: 3D was performed not requiring image post processing on an independent workstation and was abnormal.  LEFT VENTRICLE PLAX 2D LVIDd:         5.20 cm         Diastology LVIDs:         3.90 cm         LV e' medial:    4.87 cm/s LV PW:         1.70 cm         LV E/e' medial:  21.6 LV IVS:        1.50 cm         LV e' lateral:   4.87 cm/s LVOT diam:     1.90 cm         LV E/e' lateral: 21.6 LV SV:         61 LV SV Index:   34 LVOT Area:     2.84 cm        3D Volume EF                                LV 3D EF:    Left                                             ventricul LV Volumes (MOD)                            ar LV vol d, MOD    228.0 ml                   ejection A2C:                                        fraction LV vol d, MOD     238.0 ml                   by 3D A4C:                                        volume is LV vol s, MOD    138.0 ml                   40 %. A2C: LV vol s, MOD    134.0 ml A4C:  3D Volume EF: LV SV MOD A2C:   90.0 ml       3D EF:        40 % LV SV MOD A4C:   238.0 ml      LV EDV:       257 ml LV SV MOD BP:    99.0 ml       LV ESV:       154 ml                                LV SV:        104 ml RIGHT VENTRICLE             IVC RV Basal diam:  4.40 cm     IVC diam: 2.70 cm RV Mid diam:    3.20 cm RV S prime:     11.90 cm/s TAPSE (M-mode): 2.2 cm LEFT ATRIUM             Index        RIGHT ATRIUM           Index LA diam:        5.00 cm 2.77 cm/m   RA Area:     21.40 cm LA Vol (A2C):   60.3 ml 33.39 ml/m  RA Volume:   62.00 ml  34.33 ml/m LA Vol (A4C):   67.7 ml 37.48 ml/m LA Biplane Vol: 65.6 ml 36.32 ml/m  AORTIC VALVE             PULMONIC VALVE LVOT Vmax:   122.00 cm/s PR End Diast Vel: 2.42 msec LVOT Vmean:  77.200 cm/s LVOT VTI:    0.214 m  AORTA Ao Root diam: 3.20 cm Ao Asc diam:  3.20 cm MITRAL VALVE                TRICUSPID VALVE MV Area (PHT): 2.83 cm     TR Peak grad:   37.7 mmHg MV Decel Time: 268 msec     TR Mean grad:   25.0 mmHg MR Peak grad: 53.9 mmHg     TR Vmax:        307.00 cm/s MR Vmax:      367.00 cm/s   TR Vmean:       240.0 cm/s MV E velocity: 105.00 cm/s MV A velocity: 67.30 cm/s   SHUNTS MV E/A ratio:  1.56         Systemic VTI:  0.21 m                             Systemic Diam: 1.90 cm Darryle Decent MD Electronically signed by Darryle Decent MD Signature Date/Time: 09/17/2024/2:27:25 PM    Final    CT Angio Chest Pulmonary Embolism (PE) W or WO Contrast Result Date: 09/16/2024 EXAM: CTA CHEST 09/16/2024 03:39:59 PM TECHNIQUE: CTA of the chest was performed without and with the administration of 75 mL of iohexol  (OMNIPAQUE ) 350 MG/ML intravenous contrast. Multiplanar reformatted images are provided for review. MIP images are provided for review. Automated exposure  control, iterative reconstruction, and/or weight based adjustment of the mA/kV was utilized to reduce the radiation dose to as low as reasonably achievable. COMPARISON: None available. CLINICAL HISTORY: Pulmonary embolism (PE) suspected, high prob. FINDINGS: PULMONARY ARTERIES: Pulmonary arteries are adequately opacified for evaluation. No acute pulmonary embolism. Main pulmonary artery is normal  in caliber. MEDIASTINUM: Moderate pericardial effusion. Effusion measures approximately 12 mm along the left heart border and 20 mm along the right atrium. There is no acute abnormality of the thoracic aorta. LYMPH NODES: No mediastinal, hilar or axillary lymphadenopathy. LUNGS AND PLEURA: There is dense airspace disease within the left and right lower lobes, greater on the left. Bibasilar consolidation representing atelectasis, aspiration, pneumonia. There is underlying diffuse ground glass airspace disease. Diffuse ground glass airspace disease suggests pulmonary edema. No evidence of pleural effusion or pneumothorax. UPPER ABDOMEN: Limited images of the upper abdomen are unremarkable. SOFT TISSUES AND BONES: No acute bone or soft tissue abnormality. IMPRESSION: 1. No acute pulmonary embolism. 2. Bibasilar consolidation, which may represent atelectasis, aspiration, or pneumonia. 3. Diffuse ground-glass opacities, which may represent pulmonary edema. 4. Moderate pericardial effusion. Electronically signed by: Norleen Boxer MD 09/16/2024 04:06 PM EST RP Workstation: [id number]    Medications:  [START ON 09/19/2024]  ceFAZolin  (ANCEF ) IV     ceFEPime  (MAXIPIME ) IV 1 g (09/17/24 1237)   vancomycin       amLODipine   10 mg Oral QHS   carvedilol   25 mg Oral BID WC   Chlorhexidine  Gluconate Cloth  6 each Topical Q0600   Chlorhexidine  Gluconate Cloth  6 each Topical Q0600   cloNIDine   0.1 mg Oral Daily   furosemide   80 mg Oral Daily   irbesartan   300 mg Oral Daily   mupirocin  ointment  1 Application Nasal BID    pantoprazole   40 mg Oral Daily   sucralfate   1 g Oral TID WC & HS    Dialysis Orders: MWF - [facility] 3.5hrs, BFR 400, DFR 500,  EDW 68.4kg, 2K/ 2Ca Heparin  3000 units bolus with HD Mircera 225 mcg q2wks - last 09/11/24 Hectorol  5mcg IV qHD - last 09/13/24 Sensipar  90mg  with HD - last 09/13/24  Assessment/Plan: AVF malfunction - VVS following. Hx multiple revisions in the past. Most recent revision 1/7. Scheduled ligation aborted today 2nd pt c/o CP. S/p ligation 1/16 Chest Pain - Troponins elevated initially, cardiology felt not anginal. Pain today seems pleuritic in nature, CTA with volume overload. Will plan for further UF with HD and see if this helps pain.  ESRD -  on HD MWF. Not reaching EDW and signs off early in outpatient. Given CP and volume overload, s/p HD 1/15. Remains volume overloaded, further UF with HD today Hypertension/volume  - BP meds resumed, UF as tolerated with HD as above Anemia of CKD - ESA not due yet. Last given 1/12 in outpatient. Hgb 8.9- declined post op Secondary Hyperparathyroidism -  calcium  at goal, will check phos Nutrition - Renal diet with fluid restriction  Charmaine Piety, NP [facility] 09/18/2024,11:59 AM  LOS: 4 days    "

## 2024-09-18 NOTE — Progress Notes (Signed)
 "          [facility]                                                                              Maziyah Vessel, is a 29 y.o. female, DOB - [birth year], FMW:[id number] Admit date - 09/13/2024    Outpatient Primary MD for the patient is [name], NP  LOS - 4  days  Chief Complaint  Patient presents with   Post-op Problem   Arm Pain   Emesis   Nausea   knots in my thighs       Brief summary   Patient is a 29 year old female with hypertension, G6PD deficiency, CHF, lupus, ESRD on HD, MWF schedule, history of multiple AV fistula revisions presented with progressively worsening left arm swelling and pain since after recent AV fistula surgery on 09/06/2024 (Dr Serene).  She noted wound dehiscence about 3 days ago prior to admission.  No fever or chills, nausea vomiting or abdominal pain.  Patient is receiving dialysis via right IJ TDC. Vascular surgery was consulted.  Assessment & Plan     Left upper extremity edema and pain with ongoing drainage from incision after recent AV fistula surgery - Vascular surgery following, underwent left arm placed acute basilic fistula ligation, incision and debridement with wound VAC placement on 09/15/2024 - Plan for or tomorrow for wound washout and possible closure.  N.p.o. after midnight.    Chest pain with mildly elevated troponin -Resolved.  Last 2D echo 03/06/2024 showed EF of 30 to 35% with global hypokinesis, normal RV SF - Seen by cardiology, chest pain not anginal, troponins flat - CXR 1/17 showed increasing dense left lower lobe consolidation. Procalcitonin 1.5, leukocytosis trending up, cultures NTD -Continue IV vancomycin , cefepime  - CTA chest did not show acute PE.  Bibasilar consolidation which may represent atelectasis, aspiration or pneumonia, diffuse ground glass opacities, pulmonary edema, moderate pericardial effusion -2D echo: EF 40 to 45%, global hypokinesis, severe LVH, G2 DD, moderate pericardial effusion, no  tamponade - Improving    ESRD on HD MWF -Currently receiving dialysis treatments via right IJ TDC, last HD on 1/14 - CTA chest with volume overload - Continue hemodialysis for volume management  Acute on Chronic systolic and diastolic HFrEF with volume overload --2D echo showed EF 40 to 45%, global hypokinesis, severe LVH, G2 DD, moderate pericardial effusion, no tamponade - Continue HD for volume management   Hypertension - Antihypertensives were placed on hold due to lower BP, now resumed. -  On hemodialysis, likely does not need Lasix , will defer to nephrology  GERD Continue PPI and sucralfate .   Anemia of chronic disease H&H stable, baseline 9-10 - Hb stable  Hyponatremia  - likely due to volume overload - management with HD per renal    Estimated body mass index is 25.3 kg/m as calculated from the following:   Height as of this encounter: 5' 6 (1.676 m).   Weight as of this encounter: 71.1 kg.  Code Status: Full code DVT Prophylaxis:  SCDs Start: 09/13/24 2127   Level of Care: Level of care: Telemetry Family Communication: Updated patient Disposition Plan:      Remains inpatient appropriate:  Feeling somewhat better, plan for or tomorrow   Procedures:  Left arm brachiobasilic fistula ligation Incision and debridement 38 cm Kerecis biologic substitute placement Vacuum-assisted dressing  Consultants:   Nephrology Vascular surgery Cardiology  Antimicrobials:   Anti-infectives (From admission, onward)    Start     Dose/Rate Route Frequency Ordered Stop   09/19/24 0900  ceFAZolin  (ANCEF ) IVPB 1 g/50 mL premix       Note to Pharmacy: Send with pt to OR   1 g 100 mL/hr over 30 Minutes Intravenous On call 09/18/24 0749 09/20/24 0900   09/18/24 1200  vancomycin  (VANCOREADY) IVPB 750 mg/150 mL        750 mg 150 mL/hr over 60 Minutes Intravenous Every M-W-F (Hemodialysis) 09/16/24 1008     09/16/24 1200  ceFEPIme  (MAXIPIME ) 1 g in sodium chloride  0.9 %  100 mL IVPB        1 g 200 mL/hr over 30 Minutes Intravenous Every 24 hours 09/16/24 1006     09/16/24 1100  vancomycin  (VANCOREADY) IVPB 1250 mg/250 mL        1,250 mg 166.7 mL/hr over 90 Minutes Intravenous  Once 09/16/24 1008 09/16/24 1237          Medications  amLODipine   10 mg Oral QHS   carvedilol   25 mg Oral BID WC   Chlorhexidine  Gluconate Cloth  6 each Topical Q0600   Chlorhexidine  Gluconate Cloth  6 each Topical Q0600   cloNIDine   0.1 mg Oral Daily   furosemide   80 mg Oral Daily   irbesartan   300 mg Oral Daily   mupirocin  ointment  1 Application Nasal BID   pantoprazole   40 mg Oral Daily   sucralfate   1 g Oral TID WC & HS      Subjective:   Linda [name] was seen and examined today.  Feeling somewhat better today, pain in the left arm controlled.  Fever chills, nausea vomiting.  Objective:   Vitals:   09/17/24 1726 09/17/24 1935 09/18/24 0520 09/18/24 0745  BP: (!) 145/109 (!) 149/107 (!) 134/95 (!) 137/105  Pulse: 82 80 67 72  Resp: 19 18 18 18   Temp: 98.4 F (36.9 C) 97.7 F (36.5 C) 97.7 F (36.5 C) 97.8 F (36.6 C)  TempSrc:  Oral Oral   SpO2: 100% 93% 98% 100%  Weight:      Height:        Intake/Output Summary (Last 24 hours) at 09/18/2024 1202 Last data filed at 09/17/2024 1934 Gross per 24 hour  Intake 900 ml  Output 0 ml  Net 900 ml     Wt Readings from Last 3 Encounters:  09/16/24 71.1 kg  09/06/24 70.3 kg  07/31/24 73.9 kg   Physical Exam General: Alert and oriented x 3, NAD Cardiovascular: S1 S2 clear, RRR.  Respiratory: CTAB, no wheezing Gastrointestinal: Soft, nontender, nondistended, NBS Ext: + pedal edema bilaterally Neuro: no new deficits Skin: Left arm dressing intact with wound VAC Psych: Normal affect    Data Reviewed:  I have personally reviewed following labs    CBC Lab Results  Component Value Date   WBC 27.3 (H) 09/18/2024   RBC 3.21 (L) 09/18/2024   HGB 9.0 (L) 09/18/2024   HCT 29.7 (L) 09/18/2024    MCV 92.5 09/18/2024   MCH 28.0 09/18/2024   PLT 303 09/18/2024   MCHC 30.3 09/18/2024   RDW 21.4 (H) 09/18/2024   LYMPHSABS 2.0 09/13/2024   MONOABS 1.2 (H) 09/13/2024   EOSABS 0.1  09/13/2024   BASOSABS 0.0 09/13/2024     Last metabolic panel Lab Results  Component Value Date   NA 129 (L) 09/18/2024   K 3.7 09/18/2024   CL 89 (L) 09/18/2024   CO2 24 09/18/2024   BUN 52 (H) 09/18/2024   CREATININE 6.83 (H) 09/18/2024   GLUCOSE 105 (H) 09/18/2024   GFRNONAA 8 (L) 09/18/2024   GFRAA 19 (L) 03/27/2019   CALCIUM  9.5 09/18/2024   PHOS 9.1 (H) 05/31/2024   PROT 7.9 09/13/2024   ALBUMIN  3.3 09/13/2024   BILITOT 1.1 09/13/2024   ALKPHOS 174 09/13/2024   AST 26 09/13/2024   ALT 7 09/13/2024   ANIONGAP 16 (H) 09/18/2024    CBG (last 3)  No results for input(s): GLUCAP in the last 72 hours.    Coagulation Profile: No results for input(s): INR, PROTIME in the last 168 hours.   Radiology Studies: I have personally reviewed the imaging studies  ECHOCARDIOGRAM COMPLETE Result Date: 09/17/2024    ECHOCARDIOGRAM REPORT   Patient Name:   Linda [name] Date of Exam: 09/17/2024 Medical Rec #:  [id number]             Height:       66.1 in Accession #:    [id number]            Weight:       156.7 lb Date of Birth:  [birth year]             BSA:          1.806 m Patient Age:    28 years              BP:           150/110 mmHg Patient Gender: F                     HR:           82 bpm. Exam Location:  Inpatient Procedure: 2D Echo, 3D Echo, Cardiac Doppler and Color Doppler (Both Spectral            and Color Flow Doppler were utilized during procedure). Indications:    CHF- Acute Systolic I50.21  History:        Patient has prior history of Echocardiogram examinations, most                 recent 03/06/2024. HFrEF; Risk Factors:Hypertension and ESRD.  Sonographer:    Koleen Popper RDCS Referring Phys: LYNWOOD SCHILLING IMPRESSIONS  1. Left ventricular ejection fraction, by estimation, is 40  to 45%. Left ventricular ejection fraction by 3D volume is 40 %. The left ventricle has mildly decreased function. The left ventricle demonstrates global hypokinesis. There is severe concentric  left ventricular hypertrophy. Left ventricular diastolic parameters are consistent with Grade II diastolic dysfunction (pseudonormalization).  2. Right ventricular systolic function is mildly reduced. The right ventricular size is normal. There is moderately elevated pulmonary artery systolic pressure. The estimated right ventricular systolic pressure is 45.7 mmHg.  3. Left atrial size was mildly dilated.  4. Moderate pericardial effusion. The pericardial effusion is posterior to the left ventricle and localized near the right atrium. There is no evidence of cardiac tamponade.  5. The mitral valve is grossly normal. Mild mitral valve regurgitation. No evidence of mitral stenosis.  6. The aortic valve is tricuspid. Aortic valve regurgitation is not visualized. No aortic stenosis is present.  7. The inferior vena cava is dilated in size  with >50% respiratory variability, suggesting right atrial pressure of 8 mmHg. Comparison(s): Changes from prior study are noted. The left ventricular function has improved. FINDINGS  Left Ventricle: Left ventricular ejection fraction, by estimation, is 40 to 45%. Left ventricular ejection fraction by 3D volume is 40 %. The left ventricle has mildly decreased function. The left ventricle demonstrates global hypokinesis. The left ventricular internal cavity size was normal in size. There is severe concentric left ventricular hypertrophy. Left ventricular diastolic parameters are consistent with Grade II diastolic dysfunction (pseudonormalization). Right Ventricle: The right ventricular size is normal. No increase in right ventricular wall thickness. Right ventricular systolic function is mildly reduced. There is moderately elevated pulmonary artery systolic pressure. The tricuspid regurgitant  velocity is 3.07 m/s, and with an assumed right atrial pressure of 8 mmHg, the estimated right ventricular systolic pressure is 45.7 mmHg. Left Atrium: Left atrial size was mildly dilated. Right Atrium: Right atrial size was normal in size. Pericardium: A moderately sized pericardial effusion is present. The pericardial effusion is posterior to the left ventricle and localized near the right atrium. There is no evidence of cardiac tamponade. Mitral Valve: The mitral valve is grossly normal. Mild mitral valve regurgitation. No evidence of mitral valve stenosis. Tricuspid Valve: The tricuspid valve is grossly normal. Tricuspid valve regurgitation is mild . No evidence of tricuspid stenosis. Aortic Valve: The aortic valve is tricuspid. Aortic valve regurgitation is not visualized. No aortic stenosis is present. Pulmonic Valve: The pulmonic valve was grossly normal. Pulmonic valve regurgitation is mild. No evidence of pulmonic stenosis. Aorta: The aortic root and ascending aorta are structurally normal, with no evidence of dilitation. Venous: The inferior vena cava is dilated in size with greater than 50% respiratory variability, suggesting right atrial pressure of 8 mmHg. IAS/Shunts: The atrial septum is grossly normal. Additional Comments: 3D was performed not requiring image post processing on an independent workstation and was abnormal.  LEFT VENTRICLE PLAX 2D LVIDd:         5.20 cm         Diastology LVIDs:         3.90 cm         LV e' medial:    4.87 cm/s LV PW:         1.70 cm         LV E/e' medial:  21.6 LV IVS:        1.50 cm         LV e' lateral:   4.87 cm/s LVOT diam:     1.90 cm         LV E/e' lateral: 21.6 LV SV:         61 LV SV Index:   34 LVOT Area:     2.84 cm        3D Volume EF                                LV 3D EF:    Left                                             ventricul LV Volumes (MOD)                            ar  LV vol d, MOD    228.0 ml                   ejection A2C:                                         fraction LV vol d, MOD    238.0 ml                   by 3D A4C:                                        volume is LV vol s, MOD    138.0 ml                   40 %. A2C: LV vol s, MOD    134.0 ml A4C:                           3D Volume EF: LV SV MOD A2C:   90.0 ml       3D EF:        40 % LV SV MOD A4C:   238.0 ml      LV EDV:       257 ml LV SV MOD BP:    99.0 ml       LV ESV:       154 ml                                LV SV:        104 ml RIGHT VENTRICLE             IVC RV Basal diam:  4.40 cm     IVC diam: 2.70 cm RV Mid diam:    3.20 cm RV S prime:     11.90 cm/s TAPSE (M-mode): 2.2 cm LEFT ATRIUM             Index        RIGHT ATRIUM           Index LA diam:        5.00 cm 2.77 cm/m   RA Area:     21.40 cm LA Vol (A2C):   60.3 ml 33.39 ml/m  RA Volume:   62.00 ml  34.33 ml/m LA Vol (A4C):   67.7 ml 37.48 ml/m LA Biplane Vol: 65.6 ml 36.32 ml/m  AORTIC VALVE             PULMONIC VALVE LVOT Vmax:   122.00 cm/s PR End Diast Vel: 2.42 msec LVOT Vmean:  77.200 cm/s LVOT VTI:    0.214 m  AORTA Ao Root diam: 3.20 cm Ao Asc diam:  3.20 cm MITRAL VALVE                TRICUSPID VALVE MV Area (PHT): 2.83 cm     TR Peak grad:   37.7 mmHg MV Decel Time: 268 msec     TR Mean grad:   25.0 mmHg MR Peak grad: 53.9 mmHg     TR Vmax:        307.00 cm/s MR Vmax:  367.00 cm/s   TR Vmean:       240.0 cm/s MV E velocity: 105.00 cm/s MV A velocity: 67.30 cm/s   SHUNTS MV E/A ratio:  1.56         Systemic VTI:  0.21 m                             Systemic Diam: 1.90 cm Darryle Decent MD Electronically signed by Darryle Decent MD Signature Date/Time: 09/17/2024/2:27:25 PM    Final    CT Angio Chest Pulmonary Embolism (PE) W or WO Contrast Result Date: 09/16/2024 EXAM: CTA CHEST 09/16/2024 03:39:59 PM TECHNIQUE: CTA of the chest was performed without and with the administration of 75 mL of iohexol  (OMNIPAQUE ) 350 MG/ML intravenous contrast. Multiplanar reformatted images are provided for review. MIP  images are provided for review. Automated exposure control, iterative reconstruction, and/or weight based adjustment of the mA/kV was utilized to reduce the radiation dose to as low as reasonably achievable. COMPARISON: None available. CLINICAL HISTORY: Pulmonary embolism (PE) suspected, high prob. FINDINGS: PULMONARY ARTERIES: Pulmonary arteries are adequately opacified for evaluation. No acute pulmonary embolism. Main pulmonary artery is normal in caliber. MEDIASTINUM: Moderate pericardial effusion. Effusion measures approximately 12 mm along the left heart border and 20 mm along the right atrium. There is no acute abnormality of the thoracic aorta. LYMPH NODES: No mediastinal, hilar or axillary lymphadenopathy. LUNGS AND PLEURA: There is dense airspace disease within the left and right lower lobes, greater on the left. Bibasilar consolidation representing atelectasis, aspiration, pneumonia. There is underlying diffuse ground glass airspace disease. Diffuse ground glass airspace disease suggests pulmonary edema. No evidence of pleural effusion or pneumothorax. UPPER ABDOMEN: Limited images of the upper abdomen are unremarkable. SOFT TISSUES AND BONES: No acute bone or soft tissue abnormality. IMPRESSION: 1. No acute pulmonary embolism. 2. Bibasilar consolidation, which may represent atelectasis, aspiration, or pneumonia. 3. Diffuse ground-glass opacities, which may represent pulmonary edema. 4. Moderate pericardial effusion. Electronically signed by: Norleen Boxer MD 09/16/2024 04:06 PM EST RP Workstation: [id number]       Nydia Distance M.D. [facility] 09/18/2024, 12:02 PM  Available via Epic secure chat 7am-7pm After 7 pm, please refer to night coverage provider listed on amion.    "

## 2024-09-18 NOTE — Plan of Care (Signed)
" °  Problem: Education: Goal: Knowledge of General Education information will improve Description: Including pain rating scale, medication(s)/side effects and non-pharmacologic comfort measures Outcome: Progressing   Problem: Health Behavior/Discharge Planning: Goal: Ability to manage health-related needs will improve Outcome: Progressing   Problem: Clinical Measurements: Goal: Ability to maintain clinical measurements within normal limits will improve Outcome: Progressing Goal: Will remain free from infection Outcome: Progressing Goal: Diagnostic test results will improve Outcome: Progressing Goal: Respiratory complications will improve Outcome: Progressing Goal: Cardiovascular complication will be avoided Outcome: Progressing   Problem: Activity: Goal: Risk for activity intolerance will decrease Outcome: Progressing   Problem: Nutrition: Goal: Adequate nutrition will be maintained Outcome: Progressing   Problem: Coping: Goal: Level of anxiety will decrease Outcome: Progressing   Problem: Elimination: Goal: Will not experience complications related to bowel motility Outcome: Progressing Goal: Will not experience complications related to urinary retention Outcome: Progressing   Problem: Pain Managment: Goal: General experience of comfort will improve and/or be controlled Outcome: Progressing   Problem: Safety: Goal: Ability to remain free from injury will improve Outcome: Progressing   Problem: Skin Integrity: Goal: Risk for impaired skin integrity will decrease Outcome: Progressing   Problem: Education: Goal: Knowledge of the prescribed therapeutic regimen will improve Outcome: Progressing   Problem: Bowel/Gastric: Goal: Gastrointestinal status for postoperative course will improve Outcome: Progressing   Problem: Cardiac: Goal: Ability to maintain an adequate cardiac output Outcome: Progressing Goal: Will show no evidence of cardiac arrhythmias Outcome:  Progressing   Problem: Nutritional: Goal: Will attain and maintain optimal nutritional status Outcome: Progressing   Problem: Neurological: Goal: Will regain or maintain usual level of consciousness Outcome: Progressing   Problem: Clinical Measurements: Goal: Ability to maintain clinical measurements within normal limits Outcome: Progressing Goal: Postoperative complications will be avoided or minimized Outcome: Progressing   Problem: Respiratory: Goal: Will regain and/or maintain adequate ventilation Outcome: Progressing Goal: Respiratory status will improve Outcome: Progressing   Problem: Skin Integrity: Goal: Demonstrates signs of wound healing without infection Outcome: Progressing   Problem: Urinary Elimination: Goal: Will remain free from infection Outcome: Progressing Goal: Ability to achieve and maintain adequate urine output Outcome: Progressing   "

## 2024-09-18 NOTE — Progress Notes (Signed)
" ° ° °  PROCEDURAL EXPEDITER PROGRESS NOTE  Patient Name: Linda [name]  DOB:22-Apr-1996 Date of Admission: 09/13/2024  Date of Assessment:09/18/24   -------------------------------------------------------------------------------------------------------------------   Brief clinical summary: patient going for surgery on 09-19-24 for washout and possible closure of left arm wound  Orders in place:   Yes   Communication with surgical team if no orders: none  Labs, test, and orders reviewed: yes  Requires surgical clearance:   No  What type of clearance: n/a  Clearance received: n/a  Barriers noted:none   Intervention provided by [facility] team: n/a  Barrier resolved:   no   -------------------------------------------------------------------------------------------------------------------  [employer], Rexene LITTIE Kirks Please contact us  directly via secure chat (search for [facility]) or by calling us  at [phone] [facility]).  "

## 2024-09-18 NOTE — TOC Progression Note (Signed)
 Transition of Care [facility]) - Progression Note    Patient Details  Name: Linda [name] MRN: [id number] Date of Birth: 27-Oct-1995  [facility] CM/SW Contact  Tom-Johnson, Harlea Goetzinger Daphne, RN Phone Number: 09/18/2024, 2:37 PM  Clinical Narrative:     Patient is planned to return to OR tomorrow 09/19/24 to washout Wound and possible closure. Patient's ECHO done yesterday 09/17/24 and showed LVEF 40-45% with severe LVH, Global Hypokinesis and grade 2 DD. Mild RV Systolic dysfunction, Cardiology following. Nephrology following for iHD.   Patient not Medically ready for discharge.  CM will continue to follow as patient progresses with care towards discharge.                     Expected Discharge Plan and Services                                               Social Drivers of Health (SDOH) Interventions SDOH Screenings   Food Insecurity: No Food Insecurity (09/14/2024)  Housing: Low Risk (09/14/2024)  Transportation Needs: No Transportation Needs (09/14/2024)  Utilities: Not At Risk (09/14/2024)  Alcohol Screen: Low Risk (02/03/2024)  Depression (PHQ2-9): Low Risk (02/03/2024)  Recent Concern: Depression (PHQ2-9) - Medium Risk (01/26/2024)  Financial Resource Strain: Low Risk (02/03/2024)  Physical Activity: Insufficiently Active (02/03/2024)  Social Connections: Moderately Isolated (09/14/2024)  Stress: No Stress Concern Present (02/03/2024)  Tobacco Use: High Risk (09/15/2024)  Health Literacy: Adequate Health Literacy (02/03/2024)    Readmission Risk Interventions    09/15/2024    2:40 PM 11/15/2023    2:53 PM 10/25/2023    3:32 PM  Readmission Risk Prevention Plan  Transportation Screening Complete Complete Complete  HRI or Home Care Consult   Complete  Social Work Consult for Recovery Care Planning/Counseling   Complete  Palliative Care Screening   Complete  Medication Review Oceanographer) Referral to Pharmacy Referral to Pharmacy Referral to  Pharmacy  PCP or Specialist appointment within 3-5 days of discharge Complete Complete   HRI or Home Care Consult Complete Complete   SW Recovery Care/Counseling Consult Complete Complete   Palliative Care Screening Not Applicable Not Applicable   Skilled Nursing Facility Not Applicable Not Applicable

## 2024-09-18 NOTE — Care Management Important Message (Signed)
 Important Message  Patient Details  Name: Linda [name] MRN: [id number] Date of Birth: Jan 24, 1996   Important Message Given:  Yes - Medicare IM     Claretta Deed 09/18/2024, 4:04 PM

## 2024-09-18 NOTE — Progress Notes (Signed)
 Received patient in bed to unit.  Alert and oriented.  Informed consent signed and in chart.   TX duration:3.5 hours  Patient tolerated well.  Transported back to the room  Alert, without acute distress.  Hand-off given to patient's nurse.   Access used: right upper chest HD cath, changed dressing Access issues: none  Total UF removed: 3L Medication(s) given: dilaudid , oxygen 2L nasal canula for comfort   09/18/24 1706  Vitals  Temp 98.9 F (37.2 C)  Temp Source Oral  BP (!) 128/112  Pulse Rate 74  ECG Heart Rate 74  Resp 19  Weight 80.7 kg  Type of Weight Post-Dialysis  Oxygen Therapy  SpO2 100 %  O2 Device Nasal Cannula  O2 Flow Rate (L/min) 2 L/min  During Treatment Monitoring  Duration of HD Treatment -hour(s) 3.5 hour(s)  HD Safety Checks Performed Yes  Intra-Hemodialysis Comments Tx completed  Post Treatment  Dialyzer Clearance Clear  Liters Processed 72.8  Fluid Removed (mL) 3000 mL  Tolerated HD Treatment Yes  Hemodialysis Catheter Right Internal jugular Double lumen Temporary (Non-Tunneled)  No placement date or time found.   Orientation: Right  Access Location: Internal jugular  Hemodialysis Catheter Type: Double lumen Temporary (Non-Tunneled)  Site Condition No complications  Blue Lumen Status Flushed;Dead end cap in place;Antimicrobial dead end cap  Red Lumen Status Flushed;Antimicrobial dead end cap;Heparin  locked  Purple Lumen Status N/A  Catheter fill solution Heparin  1000 units/ml  Catheter fill volume (Arterial) 1.9 cc  Catheter fill volume (Venous) 1.9  Dressing Type Transparent  Dressing Status Antimicrobial disc/dressing in place;Clean, Dry, Intact  Drainage Description None  Dressing Change Due 09/25/24     Linda Brasil LPN [facility]

## 2024-09-18 NOTE — Progress Notes (Signed)
 Got a hold of Dr. Penne Colorado (Vascular Surgeon)due to wound vac saying blockage area.  He said to stop the machine due to patient going to OR tomorrow.  Camellia Brasil, LPN [facility]

## 2024-09-18 NOTE — Progress Notes (Signed)
 Previous RN got orders to shut wound vac off, so wound vac was off for more than 2 hours. Attempted to take down and do a wet to dry dressing. RN able to take wound vac off, but patient refused to allow sponge to be taken out. RN cleaned area well and covered with Vaseline gauze and kerlix. Messaged vasc surgeon about patient refusing.

## 2024-09-18 NOTE — Progress Notes (Addendum)
" °  Progress Note    09/18/2024 7:28 AM 3 Days Post-Op  Subjective:  says her hand and arm are much better.   afebrile  Vitals:   09/17/24 1935 09/18/24 0520  BP: (!) 149/107 (!) 134/95  Pulse: 80 67  Resp: 18 18  Temp: 97.7 F (36.5 C) 97.7 F (36.5 C)  SpO2: 93% 98%    Physical Exam: General:  no distress Lungs:  non labored Incisions:  wrap in place Extremities:  left hand swelling much improved from when I saw her in surgery.    CBC    Component Value Date/Time   WBC 27.3 (H) 09/18/2024 0338   RBC 3.21 (L) 09/18/2024 0338   HGB 9.0 (L) 09/18/2024 0338   HCT 29.7 (L) 09/18/2024 0338   PLT 303 09/18/2024 0338   MCV 92.5 09/18/2024 0338   MCH 28.0 09/18/2024 0338   MCHC 30.3 09/18/2024 0338   RDW 21.4 (H) 09/18/2024 0338   LYMPHSABS 2.0 09/13/2024 1454   MONOABS 1.2 (H) 09/13/2024 1454   EOSABS 0.1 09/13/2024 1454   BASOSABS 0.0 09/13/2024 1454    BMET    Component Value Date/Time   NA 129 (L) 09/18/2024 0338   NA 139 06/08/2017 1145   K 3.7 09/18/2024 0338   CL 89 (L) 09/18/2024 0338   CO2 24 09/18/2024 0338   GLUCOSE 105 (H) 09/18/2024 0338   BUN 52 (H) 09/18/2024 0338   BUN 12 06/08/2017 1145   CREATININE 6.83 (H) 09/18/2024 0338   CALCIUM  9.5 09/18/2024 0338   GFRNONAA 8 (L) 09/18/2024 0338   GFRAA 19 (L) 03/27/2019 1510    INR    Component Value Date/Time   INR 1.1 04/07/2024 1215     Intake/Output Summary (Last 24 hours) at 09/18/2024 0728 Last data filed at 09/17/2024 1934 Gross per 24 hour  Intake 1700 ml  Output 0 ml  Net 1700 ml      Assessment/Plan:  29 y.o. female is s/p:  Ligation of left arm AVF, I&D with Kerecis placement and vac 09/15/2024 by Dr. Lanis    -plan for OR tomorrow to washout wound and possible closure.  Arm and hand swelling much improved.   -npo after MD/consent/labs ordered -intra-op wound culture no growth x 3 days. -ok for SQ heparin  from vascular standpoint.    Lucie Apt, PA-C [facility] [phone] 09/18/2024 7:28 AM    "

## 2024-09-19 ENCOUNTER — Encounter (HOSPITAL_COMMUNITY): Admission: EM | Payer: Self-pay | Source: Home / Self Care | Attending: Family Medicine

## 2024-09-19 ENCOUNTER — Encounter (HOSPITAL_COMMUNITY): Payer: Self-pay | Admitting: Internal Medicine

## 2024-09-19 ENCOUNTER — Inpatient Hospital Stay (HOSPITAL_COMMUNITY): Admitting: Certified Registered Nurse Anesthetist

## 2024-09-19 DIAGNOSIS — I110 Hypertensive heart disease with heart failure: Secondary | ICD-10-CM

## 2024-09-19 DIAGNOSIS — Z9889 Other specified postprocedural states: Secondary | ICD-10-CM

## 2024-09-19 DIAGNOSIS — K219 Gastro-esophageal reflux disease without esophagitis: Secondary | ICD-10-CM | POA: Diagnosis not present

## 2024-09-19 DIAGNOSIS — S41102A Unspecified open wound of left upper arm, initial encounter: Secondary | ICD-10-CM | POA: Diagnosis not present

## 2024-09-19 DIAGNOSIS — I5021 Acute systolic (congestive) heart failure: Secondary | ICD-10-CM | POA: Diagnosis not present

## 2024-09-19 DIAGNOSIS — F17210 Nicotine dependence, cigarettes, uncomplicated: Secondary | ICD-10-CM

## 2024-09-19 DIAGNOSIS — T8131XA Disruption of external operation (surgical) wound, not elsewhere classified, initial encounter: Secondary | ICD-10-CM | POA: Diagnosis not present

## 2024-09-19 DIAGNOSIS — T82898A Other specified complication of vascular prosthetic devices, implants and grafts, initial encounter: Secondary | ICD-10-CM | POA: Diagnosis not present

## 2024-09-19 DIAGNOSIS — I10 Essential (primary) hypertension: Secondary | ICD-10-CM | POA: Diagnosis not present

## 2024-09-19 DIAGNOSIS — N186 End stage renal disease: Secondary | ICD-10-CM | POA: Diagnosis not present

## 2024-09-19 DIAGNOSIS — M7989 Other specified soft tissue disorders: Secondary | ICD-10-CM | POA: Diagnosis not present

## 2024-09-19 HISTORY — PX: INCISION AND DRAINAGE OF WOUND: SHX1803

## 2024-09-19 HISTORY — PX: APPLICATION OF WOUND VAC: SHX5189

## 2024-09-19 LAB — BASIC METABOLIC PANEL WITH GFR
Anion gap: 12 (ref 5–15)
BUN: 34 mg/dL — ABNORMAL HIGH (ref 6–20)
CO2: 26 mmol/L (ref 22–32)
Calcium: 9.1 mg/dL (ref 8.9–10.3)
Chloride: 91 mmol/L — ABNORMAL LOW (ref 98–111)
Creatinine, Ser: 4.87 mg/dL — ABNORMAL HIGH (ref 0.44–1.00)
GFR, Estimated: 12 mL/min — ABNORMAL LOW
Glucose, Bld: 87 mg/dL (ref 70–99)
Potassium: 4 mmol/L (ref 3.5–5.1)
Sodium: 128 mmol/L — ABNORMAL LOW (ref 135–145)

## 2024-09-19 LAB — CBC
HCT: 29.1 % — ABNORMAL LOW (ref 36.0–46.0)
Hemoglobin: 8.8 g/dL — ABNORMAL LOW (ref 12.0–15.0)
MCH: 28 pg (ref 26.0–34.0)
MCHC: 30.2 g/dL (ref 30.0–36.0)
MCV: 92.7 fL (ref 80.0–100.0)
Platelets: 269 K/uL (ref 150–400)
RBC: 3.14 MIL/uL — ABNORMAL LOW (ref 3.87–5.11)
RDW: 21.3 % — ABNORMAL HIGH (ref 11.5–15.5)
WBC: 14.2 K/uL — ABNORMAL HIGH (ref 4.0–10.5)
nRBC: 0 % (ref 0.0–0.2)

## 2024-09-19 MED ORDER — FENTANYL CITRATE (PF) 100 MCG/2ML IJ SOLN
INTRAMUSCULAR | Status: AC
  Filled 2024-09-19: qty 2

## 2024-09-19 MED ORDER — FENTANYL CITRATE (PF) 100 MCG/2ML IJ SOLN: 25.0000 ug | INTRAMUSCULAR | Status: DC | PRN

## 2024-09-19 MED ORDER — PROPOFOL 10 MG/ML IV BOLUS
INTRAVENOUS | Status: AC
  Filled 2024-09-19: qty 20

## 2024-09-19 MED ORDER — PROPOFOL 10 MG/ML IV BOLUS
INTRAVENOUS | Status: DC | PRN
  Administered 2024-09-19: 80 mg via INTRAVENOUS

## 2024-09-19 MED ORDER — LIDOCAINE 2% (20 MG/ML) 5 ML SYRINGE
INTRAMUSCULAR | Status: DC | PRN
  Administered 2024-09-19: 80 mg via INTRAVENOUS

## 2024-09-19 MED ORDER — CHLORHEXIDINE GLUCONATE 0.12 % MT SOLN: 15.0000 mL | Freq: Once | OROMUCOSAL | Status: AC

## 2024-09-19 MED ORDER — DEXAMETHASONE SOD PHOSPHATE PF 10 MG/ML IJ SOLN
INTRAMUSCULAR | Status: DC | PRN
  Administered 2024-09-19: 4 mg via INTRAVENOUS

## 2024-09-19 MED ORDER — CHLORHEXIDINE GLUCONATE 0.12 % MT SOLN
OROMUCOSAL | Status: AC
  Administered 2024-09-19: 15 mL via OROMUCOSAL
  Filled 2024-09-19: qty 15

## 2024-09-19 MED ORDER — PHENYLEPHRINE HCL-NACL 20-0.9 MG/250ML-% IV SOLN
INTRAVENOUS | Status: DC | PRN
  Administered 2024-09-19: 20 ug/min via INTRAVENOUS

## 2024-09-19 MED ORDER — ORAL CARE MOUTH RINSE: 15.0000 mL | Freq: Once | OROMUCOSAL | Status: AC

## 2024-09-19 MED ORDER — LACTATED RINGERS IV SOLN: INTRAVENOUS | Status: DC

## 2024-09-19 MED ORDER — MIDAZOLAM HCL 2 MG/2ML IJ SOLN
INTRAMUSCULAR | Status: AC
  Filled 2024-09-19: qty 2

## 2024-09-19 MED ORDER — VASHE WOUND IRRIGATION OPTIME
TOPICAL | Status: DC | PRN
  Administered 2024-09-19: 34 [oz_av]

## 2024-09-19 MED ORDER — FENTANYL CITRATE (PF) 100 MCG/2ML IJ SOLN
INTRAMUSCULAR | Status: DC | PRN
  Administered 2024-09-19: 25 ug via INTRAVENOUS

## 2024-09-19 MED ORDER — SODIUM CHLORIDE 0.9 % IV SOLN: INTRAVENOUS | Status: DC

## 2024-09-19 MED ORDER — OXYCODONE HCL 5 MG/5ML PO SOLN: 5.0000 mg | Freq: Once | ORAL | Status: DC | PRN

## 2024-09-19 MED ORDER — ONDANSETRON HCL 4 MG/2ML IJ SOLN
INTRAMUSCULAR | Status: DC | PRN
  Administered 2024-09-19: 4 mg via INTRAVENOUS

## 2024-09-19 MED ORDER — 0.9 % SODIUM CHLORIDE (POUR BTL) OPTIME
TOPICAL | Status: DC | PRN
  Administered 2024-09-19: 1000 mL

## 2024-09-19 MED ORDER — CHLORHEXIDINE GLUCONATE CLOTH 2 % EX PADS
6.0000 | MEDICATED_PAD | Freq: Every day | CUTANEOUS | Status: DC
  Administered 2024-09-20: 6 via TOPICAL

## 2024-09-19 MED ORDER — OXYCODONE HCL 5 MG PO TABS: 5.0000 mg | ORAL_TABLET | Freq: Once | ORAL | Status: DC | PRN

## 2024-09-19 NOTE — Progress Notes (Signed)
 "  [facility] Progress Note   Subjective:    Seen and examined patient at bedside. Tolerated yesterday's HD with net UF 3L. S/p I&D with wound vac placement by VVS today. Currently on RA. Next HD 1/21.  Objective Vitals:   09/19/24 1010 09/19/24 1015 09/19/24 1121 09/19/24 1241  BP:  (!) 120/94 (!) 126/93 (!) 129/102  Pulse:  66 73 75  Resp:  17 18   Temp:   98.4 F (36.9 C) 98.1 F (36.7 C)  TempSrc:    Oral  SpO2: 95% 96% 100% 100%  Weight:      Height:       Physical Exam General: Alert female; on RA; NAD Heart: RRR, no murmur Lungs: diminished breath sounds in LLL, clear other lobes no rhonchi/ rales auscultated  Abdomen: Soft, non-distended Extremities: No edema b/l lower extremities Dialysis Access:  L arm wrapped, wound vac in place, [facility]  Filed Weights   09/18/24 1310 09/18/24 1706 09/19/24 0746  Weight: 83.7 kg 80.7 kg 80.7 kg    Intake/Output Summary (Last 24 hours) at 09/19/2024 1625 Last data filed at 09/19/2024 1500 Gross per 24 hour  Intake 970 ml  Output 3000 ml  Net -2030 ml    Additional Objective Labs: Basic Metabolic Panel: Recent Labs  Lab 09/17/24 0404 09/18/24 0338 09/19/24 0338  NA 129* 129* 128*  K 3.8 3.7 4.0  CL 91* 89* 91*  CO2 23 24 26   GLUCOSE 139* 105* 87  BUN 38* 52* 34*  CREATININE 5.61* 6.83* 4.87*  CALCIUM  9.6 9.5 9.1   Liver Function Tests: Recent Labs  Lab 09/13/24 1454  AST 26  ALT 7  ALKPHOS 174  BILITOT 1.1  PROT 7.9  ALBUMIN  3.3   No results for input(s): LIPASE, AMYLASE in the last 168 hours. CBC: Recent Labs  Lab 09/13/24 1454 09/14/24 0411 09/15/24 0850 09/16/24 0550 09/17/24 0404 09/18/24 0338 09/19/24 0338  WBC 9.2 7.8  --  15.5* 18.7* 27.3* 14.2*  NEUTROABS 5.6  --   --   --   --   --   --   HGB 9.0* 8.3*   < > 8.5* 8.9* 9.0* 8.8*  HCT 29.4* 28.0*   < > 28.5* 29.0* 29.7* 29.1*  MCV 92.7 94.6  --  92.2 92.4 92.5 92.7  PLT 292 289  --  249 260 303 269   < > = values in  this interval not displayed.   Blood Culture    Component Value Date/Time   SDES BLOOD RIGHT HAND 09/16/2024 1326   SPECREQUEST  09/16/2024 1326    BOTTLES DRAWN AEROBIC ONLY Blood Culture results may not be optimal due to an inadequate volume of blood received in culture bottles   CULT  09/16/2024 1326    NO GROWTH 3 DAYS Performed at [facility], [address], [place], [place] [address]    REPTSTATUS PENDING 09/16/2024 1326    Cardiac Enzymes: No results for input(s): CKTOTAL, CKMB, CKMBINDEX, TROPONINI in the last 168 hours. CBG: No results for input(s): GLUCAP in the last 168 hours. Iron  Studies: No results for input(s): IRON , TIBC, TRANSFERRIN, FERRITIN in the last 72 hours. Lab Results  Component Value Date   INR 1.1 04/07/2024   INR 1.2 10/10/2023   INR 1.0 03/27/2019   Studies/Results: No results found.  Medications:  ceFEPime  (MAXIPIME ) IV 1 g (09/19/24 1244)   vancomycin  Stopped (09/18/24 1831)    amLODipine   10 mg Oral QHS  carvedilol   25 mg Oral BID WC   Chlorhexidine  Gluconate Cloth  6 each Topical Q0600   Chlorhexidine  Gluconate Cloth  6 each Topical Q0600   cloNIDine   0.1 mg Oral Daily   furosemide   80 mg Oral Daily   irbesartan   300 mg Oral Daily   pantoprazole   40 mg Oral Daily   sucralfate   1 g Oral TID WC & HS    Dialysis Orders: MWF - [facility] 3.5hrs, BFR 400, DFR 500,  EDW 68.4kg, 2K/ 2Ca Heparin  3000 units bolus with HD Mircera 225 mcg q2wks - last 09/11/24 Hectorol  5mcg IV qHD - last 09/13/24 Sensipar  90mg  with HD - last 09/13/24  Assessment/Plan: AVF malfunction - VVS following. Hx multiple revisions in the past. Most recent revision 1/7. Scheduled ligation aborted today 2nd pt c/o CP. S/p ligation 1/16. S/p I&D with wound vac placement today by VVS. Chest Pain - Troponins elevated initially, cardiology felt not anginal. Pain today seems pleuritic in nature, CTA with volume overload. Will plan for  further UF with HD and see if this helps pain.  ESRD -  on HD MWF. Not reaching EDW and signs off early in outpatient. Given CP and volume overload, s/p HD 1/15. Remains volume overloaded, next HD 1/21. More likely will need an extra HD this week. Hypertension/volume  - BP meds resumed, UF as tolerated with HD as above Anemia of CKD - ESA not due yet. Last given 1/12 in outpatient. Hgb 8.8- declined post op Secondary Hyperparathyroidism -  calcium  at goal, will check phos Nutrition - Renal diet with fluid restriction  Charmaine Piety, NP [facility] 09/19/2024,4:25 PM  LOS: 5 days    "

## 2024-09-19 NOTE — Anesthesia Preprocedure Evaluation (Signed)
 "                                  Anesthesia Evaluation  Patient identified by MRN, date of birth, ID band  Reviewed: Allergy & Precautions, H&P , NPO status , Patient's Chart, lab work & pertinent test results, Unable to perform ROS - Chart review only  History of Anesthesia Complications Negative for: history of anesthetic complications  Airway Mallampati: III  TM Distance: >3 FB Neck ROM: Full    Dental  (+) Dental Advisory Given, Teeth Intact,    Pulmonary neg sleep apnea, neg recent URI, Current Smoker and Patient abstained from smoking.   breath sounds clear to auscultation       Cardiovascular hypertension, Pt. on medications + angina  +CHF   Rhythm:Regular  TTE 02/2024: 1. Left ventricular ejection fraction, by estimation, is 30 to 35%. The  left ventricle has moderately decreased function. The left ventricle  demonstrates global hypokinesis. The left ventricular internal cavity size  was moderately dilated. There is mild   left ventricular hypertrophy. Left ventricular diastolic parameters are  indeterminate.   2. Right ventricular systolic function is normal. The right ventricular  size is normal.   3. Left atrial size was moderately dilated.   4. Right atrial size was moderately dilated.   5. The mitral valve is abnormal. Mild mitral valve regurgitation. No  evidence of mitral stenosis.   6. Tricuspid valve regurgitation is moderate.   7. The aortic valve is normal in structure. Aortic valve regurgitation is  not visualized. No aortic stenosis is present.   8. The inferior vena cava is normal in size with greater than 50%  respiratory variability, suggesting right atrial pressure of 3 mmHg.     Neuro/Psych  Headaches, neg Seizures PSYCHIATRIC DISORDERS Anxiety        GI/Hepatic Neg liver ROS,GERD  Medicated,,  Endo/Other  negative endocrine ROSType 2    Renal/GU ESRF and DialysisRenal diseaseLab Results      Component                Value                Date                      NA                       131 (L)             09/15/2024                K                        3.8                 09/15/2024                CO2                      27                  09/14/2024                GLUCOSE                  101 (H)  09/15/2024                BUN                      24 (H)              09/15/2024                CREATININE               5.30 (H)            09/15/2024                CALCIUM                   8.2 (L)             09/14/2024                GFRNONAA                 8 (L)               09/14/2024            Last HD 1/15     Musculoskeletal negative musculoskeletal ROS (+)    Abdominal   Peds  Hematology  (+) Blood dyscrasia, anemia Lab Results      Component                Value               Date                      WBC                      7.8                 09/14/2024                HGB                      10.5 (L)            09/15/2024                HCT                      31.0 (L)            09/15/2024                MCV                      94.6                09/14/2024                PLT                      289                 09/14/2024              Anesthesia Other Findings SLE, Complex Pain Syndrome  Reproductive/Obstetrics                              Anesthesia Physical Anesthesia Plan  ASA:  4  Anesthesia Plan: General   Post-op Pain Management: Tylenol  PO (pre-op )*   Induction: Intravenous  PONV Risk Score and Plan: 2 and Ondansetron , Midazolam , Treatment may vary due to age or medical condition and Dexamethasone   Airway Management Planned: LMA  Additional Equipment: None  Intra-op Plan:   Post-operative Plan: Extubation in OR  Informed Consent: I have reviewed the patients History and Physical, chart, labs and discussed the procedure including the risks, benefits and alternatives for the proposed anesthesia with the patient or  authorized representative who has indicated his/her understanding and acceptance.     Dental advisory given  Plan Discussed with: CRNA, Anesthesiologist and Surgeon  Anesthesia Plan Comments:          Anesthesia Quick Evaluation  "

## 2024-09-19 NOTE — Plan of Care (Signed)
" °  Problem: Education: Goal: Knowledge of General Education information will improve Description: Including pain rating scale, medication(s)/side effects and non-pharmacologic comfort measures Outcome: Progressing   Problem: Health Behavior/Discharge Planning: Goal: Ability to manage health-related needs will improve Outcome: Progressing   Problem: Clinical Measurements: Goal: Ability to maintain clinical measurements within normal limits will improve Outcome: Progressing Goal: Will remain free from infection Outcome: Progressing Goal: Diagnostic test results will improve Outcome: Progressing Goal: Respiratory complications will improve Outcome: Progressing Goal: Cardiovascular complication will be avoided Outcome: Progressing   Problem: Activity: Goal: Risk for activity intolerance will decrease Outcome: Progressing   Problem: Nutrition: Goal: Adequate nutrition will be maintained Outcome: Progressing   Problem: Coping: Goal: Level of anxiety will decrease Outcome: Progressing   Problem: Elimination: Goal: Will not experience complications related to bowel motility Outcome: Progressing Goal: Will not experience complications related to urinary retention Outcome: Progressing   Problem: Pain Managment: Goal: General experience of comfort will improve and/or be controlled Outcome: Progressing   Problem: Safety: Goal: Ability to remain free from injury will improve Outcome: Progressing   Problem: Skin Integrity: Goal: Risk for impaired skin integrity will decrease Outcome: Progressing   Problem: Education: Goal: Knowledge of the prescribed therapeutic regimen will improve Outcome: Progressing   Problem: Bowel/Gastric: Goal: Gastrointestinal status for postoperative course will improve Outcome: Progressing   Problem: Cardiac: Goal: Ability to maintain an adequate cardiac output Outcome: Progressing Goal: Will show no evidence of cardiac arrhythmias Outcome:  Progressing   Problem: Nutritional: Goal: Will attain and maintain optimal nutritional status Outcome: Progressing   Problem: Neurological: Goal: Will regain or maintain usual level of consciousness Outcome: Progressing   Problem: Clinical Measurements: Goal: Ability to maintain clinical measurements within normal limits Outcome: Progressing Goal: Postoperative complications will be avoided or minimized Outcome: Progressing   Problem: Respiratory: Goal: Will regain and/or maintain adequate ventilation Outcome: Progressing Goal: Respiratory status will improve Outcome: Progressing   Problem: Skin Integrity: Goal: Demonstrates signs of wound healing without infection Outcome: Progressing   Problem: Urinary Elimination: Goal: Will remain free from infection Outcome: Progressing Goal: Ability to achieve and maintain adequate urine output Outcome: Progressing   "

## 2024-09-19 NOTE — Consult Note (Signed)
 WOC Nurse Consult Note:  WOC team consulted for: vac changes to start 1/23 left arm.   WOC team will follow and begin VAC changed on 09/22/24.  Thank you,  Doyal Polite, MSN, RN, [facility] WOC Team [phone] (Available Mon-Fri 0700-1500)

## 2024-09-19 NOTE — Anesthesia Postprocedure Evaluation (Signed)
"   Anesthesia Post Note  Patient: Linda [name]  Procedure(s) Performed: LIGATION OF LEFT UPPER EXTREMITY ARTERIOVENOUS  FISTULA (Left: Arm Upper) APPLICATION, SKIN SUBSTITUTE (Left: Arm Upper) APPLICATION, WOUND VAC (Left: Arm Upper) IRRIGATION AND DEBRIDEMENT LEFT UPPER EXTREMITY FISTULA (Left: Arm Upper)     Patient location during evaluation: PACU Anesthesia Type: General Level of consciousness: awake and alert Pain management: pain level controlled Vital Signs Assessment: post-procedure vital signs reviewed and stable Respiratory status: spontaneous breathing, nonlabored ventilation and respiratory function stable Cardiovascular status: stable and blood pressure returned to baseline Postop Assessment: no apparent nausea or vomiting Anesthetic complications: no   No notable events documented.           Alexanderjames Berg      "

## 2024-09-19 NOTE — Op Note (Addendum)
" ° ° °  OPERATIVE NOTE  PROCEDURE:   Wound washout and VAC placement  PRE-OPERATIVE DIAGNOSIS: Left arm fistula malfunction  POST-OPERATIVE DIAGNOSIS: same as above   SURGEON: Norman GORMAN Serve MD  ASSISTANT(S): Lucie Apt, PA  Given the complexity of the case,  the assistant was necessary in order to expedient the procedure and safely perform the technical aspects of the operation.  ANESTHESIA: general  ESTIMATED BLOOD LOSS: minimal  FINDING(S): Healthy appearing tissue bed with tissue substitute present.  The wound was beginning to granulate and the edges were tense and unable to be approximated.  Given that sutures would likely not hold due to the tension with her arm extended I elected to replace the wound VAC.  VAC changes can be done at bedside.  Wound measurements: 4.5 cm x 2.5 cm x 0.7 cm    SPECIMEN(S):  none  INDICATIONS:   Linda [name] is a 29 y.o. female with ESRD who underwent left arm fistula placement.  Over the next few days to weeks she had significant arm pain and swelling to the point where her arm was 2-3 times the size and her incision had dehisced.  It was presumed that this was due to a central stenosis versus occlusion and she was brought back to the operating room for fistula ligation.  Due to the appearance of the tissue, swelling and tension the wound was not closed and a wound VAC was placed and she was planned to have a washout, possible closure versus wound VAC placement today in the operating room.  DESCRIPTION: The patient was brought to the operating room and positioned supine on the operating table.  Anesthesia was induced and the left arm was prepped and draped in the usual sterile fashion.  A timeout was performed and preoperative antibiotics were administered.  We started by copiously irrigating the wound with Vashe solution.  I then attempted to approximate the tissue although this was only possible at the elbow and flexion.  With the  elbow extended the wound was too tense for closure and therefore instead of placing nylon sutures we elected to replace the vacuum dressing.  The black sponge was cut to size and derma tack placed over top. She tolerated procedure well, all counts were correct and she was transferred to recovery in stable condition.    COMPLICATIONS: none apparent  CONDITION: stable  Norman GORMAN Serve MD [facility] Phone Number: [phone] 09/19/2024 9:38 AM  "

## 2024-09-19 NOTE — Progress Notes (Signed)
 "          [facility]                                                                              Terrilyn Tyner, is a 29 y.o. female, DOB - 1995/09/11, FMW:[id number] Admit date - 09/13/2024    Outpatient Primary MD for the patient is [name], NP  LOS - 5  days  Chief Complaint  Patient presents with   Post-op Problem   Arm Pain   Emesis   Nausea   knots in my thighs       Brief summary   Patient is a 29 year old female with hypertension, G6PD deficiency, CHF, lupus, ESRD on HD, MWF schedule, history of multiple AV fistula revisions presented with progressively worsening left arm swelling and pain since after recent AV fistula surgery on 09/06/2024 (Dr Serene).  She noted wound dehiscence about 3 days ago prior to admission.  No fever or chills, nausea vomiting or abdominal pain.  Patient is receiving dialysis via right IJ TDC. Vascular surgery was consulted.  Assessment & Plan     Left upper extremity edema and pain with ongoing drainage from incision after recent AV fistula surgery - Vascular surgery following, underwent left arm placed acute basilic fistula ligation, incision and debridement with wound VAC placement on 09/15/2024 - Off the wound VAC since yesterday, left hand and arm swelling and pain improving -  plan for OR today, n.p.o.   Chest pain with mildly elevated troponin - Last 2D echo 03/06/2024 showed EF of 30 to 35% with global hypokinesis, normal RV SF - Seen by cardiology, chest pain not anginal, troponins flat - CXR 1/17 showed increasing dense left lower lobe consolidation.  -Continue IV vancomycin , cefepime  - CTA chest did not show acute PE. Bibasilar consolidation which may represent atelectasis, aspiration or pneumonia, diffuse ground glass opacities, pulmonary edema, moderate pericardial effusion -2D echo: EF 40 to 45%, global hypokinesis, severe LVH, G2 DD, moderate pericardial effusion, no tamponade - Improving, WBCs down to 14.2  (<27.3 on 1/19)   ESRD on HD MWF -Currently receiving dialysis treatments via right IJ TDC, last HD on 1/14 - CTA chest with volume overload - Continue hemodialysis for volume management  Acute on Chronic systolic and diastolic HFrEF with volume overload --2D echo showed EF 40 to 45%, global hypokinesis, severe LVH, G2 DD, moderate pericardial effusion, no tamponade - Continue HD for volume management   Hypertension - Antihypertensives were placed on hold due to lower BP, now resumed. -  On hemodialysis, likely does not need Lasix , will defer to nephrology  GERD Continue PPI and sucralfate .   Anemia of chronic disease H&H stable, baseline 9-10 - Hemoglobin 8.8, follow H&H postop  Hyponatremia  - likely due to volume overload - management with HD per renal    Estimated body mass index is 28.72 kg/m as calculated from the following:   Height as of this encounter: 5' 6 (1.676 m).   Weight as of this encounter: 80.7 kg.  Code Status: Full code DVT Prophylaxis:  SCDs Start: 09/13/24 2127   Level of Care: Level of care: Telemetry Family Communication: Updated patient Disposition Plan:  Remains inpatient appropriate: OR today   Procedures:  Left arm brachiobasilic fistula ligation Incision and debridement 38 cm Kerecis biologic substitute placement Vacuum-assisted dressing  Consultants:   Nephrology Vascular surgery Cardiology  Antimicrobials:   Anti-infectives (From admission, onward)    Start     Dose/Rate Route Frequency Ordered Stop   09/19/24 0900  [MAR Hold]  ceFAZolin  (ANCEF ) IVPB 1 g/50 mL premix        (MAR Hold since Tue 09/19/2024 at 0743.Hold Reason: Transfer to a Procedural area)  Note to Pharmacy: Send with pt to OR   1 g 100 mL/hr over 30 Minutes Intravenous On call 09/18/24 0749 09/20/24 0900   09/18/24 1200  [MAR Hold]  vancomycin  (VANCOREADY) IVPB 750 mg/150 mL        (MAR Hold since Tue 09/19/2024 at 0743.Hold Reason: Transfer to a  Procedural area)   750 mg 150 mL/hr over 60 Minutes Intravenous Every M-W-F (Hemodialysis) 09/16/24 1008     09/16/24 1200  [MAR Hold]  ceFEPIme  (MAXIPIME ) 1 g in sodium chloride  0.9 % 100 mL IVPB        (MAR Hold since Tue 09/19/2024 at 0743.Hold Reason: Transfer to a Procedural area)   1 g 200 mL/hr over 30 Minutes Intravenous Every 24 hours 09/16/24 1006     09/16/24 1100  vancomycin  (VANCOREADY) IVPB 1250 mg/250 mL        1,250 mg 166.7 mL/hr over 90 Minutes Intravenous  Once 09/16/24 1008 09/16/24 1237          Medications  [MAR Hold] amLODipine   10 mg Oral QHS   [MAR Hold] carvedilol   25 mg Oral BID WC   [MAR Hold] Chlorhexidine  Gluconate Cloth  6 each Topical Q0600   [MAR Hold] Chlorhexidine  Gluconate Cloth  6 each Topical Q0600   [MAR Hold] cloNIDine   0.1 mg Oral Daily   [MAR Hold] furosemide   80 mg Oral Daily   [MAR Hold] irbesartan   300 mg Oral Daily   mupirocin  ointment  1 Application Nasal BID   [MAR Hold] pantoprazole   40 mg Oral Daily   [MAR Hold] sucralfate   1 g Oral TID WC & HS      Subjective:   Sabra Harter was seen and examined today.  Sitting upright, states wound VAC off since yesterday.  Left hand, arm swelling and pain better.  No fevers, nausea or vomiting.  Waiting for surgery today.  Objective:   Vitals:   09/18/24 1729 09/18/24 2013 09/19/24 0352 09/19/24 0746  BP: (!) 134/107 (!) 136/107 (!) 140/103 (!) 127/94  Pulse: 77 76 75 71  Resp: 18 18 18 20   Temp: 98 F (36.7 C) 98 F (36.7 C) 97.6 F (36.4 C) 98.6 F (37 C)  TempSrc:    Oral  SpO2: 100% 96% 100% 97%  Weight:    80.7 kg  Height:    5' 6 (1.676 m)    Intake/Output Summary (Last 24 hours) at 09/19/2024 0929 Last data filed at 09/19/2024 0914 Gross per 24 hour  Intake 610 ml  Output 3000 ml  Net -2390 ml     Wt Readings from Last 3 Encounters:  09/19/24 80.7 kg  09/06/24 70.3 kg  07/31/24 73.9 kg   Physical Exam General: Alert and oriented x 3, NAD Cardiovascular:  S1 S2 clear, RRR.  Respiratory: CTAB, no wheezing Gastrointestinal: Soft, nontender, nondistended, NBS Ext: Left arm, hand swelling improving Neuro: no new deficits Psych: Normal affect    Data Reviewed:  I have  personally reviewed following labs    CBC Lab Results  Component Value Date   WBC 14.2 (H) 09/19/2024   RBC 3.14 (L) 09/19/2024   HGB 8.8 (L) 09/19/2024   HCT 29.1 (L) 09/19/2024   MCV 92.7 09/19/2024   MCH 28.0 09/19/2024   PLT 269 09/19/2024   MCHC 30.2 09/19/2024   RDW 21.3 (H) 09/19/2024   LYMPHSABS 2.0 09/13/2024   MONOABS 1.2 (H) 09/13/2024   EOSABS 0.1 09/13/2024   BASOSABS 0.0 09/13/2024     Last metabolic panel Lab Results  Component Value Date   NA 128 (L) 09/19/2024   K 4.0 09/19/2024   CL 91 (L) 09/19/2024   CO2 26 09/19/2024   BUN 34 (H) 09/19/2024   CREATININE 4.87 (H) 09/19/2024   GLUCOSE 87 09/19/2024   GFRNONAA 12 (L) 09/19/2024   GFRAA 19 (L) 03/27/2019   CALCIUM  9.1 09/19/2024   PHOS 9.1 (H) 05/31/2024   PROT 7.9 09/13/2024   ALBUMIN  3.3 09/13/2024   BILITOT 1.1 09/13/2024   ALKPHOS 174 09/13/2024   AST 26 09/13/2024   ALT 7 09/13/2024   ANIONGAP 12 09/19/2024    CBG (last 3)  No results for input(s): GLUCAP in the last 72 hours.    Coagulation Profile: No results for input(s): INR, PROTIME in the last 168 hours.   Radiology Studies: I have personally reviewed the imaging studies  ECHOCARDIOGRAM COMPLETE Result Date: 09/17/2024    ECHOCARDIOGRAM REPORT   Patient Name:   Jazmaine Fuelling Date of Exam: 09/17/2024 Medical Rec #:  [id number]             Height:       66.1 in Accession #:    [id number]            Weight:       156.7 lb Date of Birth:  1996-04-12             BSA:          1.806 m Patient Age:    28 years              BP:           150/110 mmHg Patient Gender: F                     HR:           82 bpm. Exam Location:  Inpatient Procedure: 2D Echo, 3D Echo, Cardiac Doppler and Color Doppler (Both Spectral             and Color Flow Doppler were utilized during procedure). Indications:    CHF- Acute Systolic I50.21  History:        Patient has prior history of Echocardiogram examinations, most                 recent 03/06/2024. HFrEF; Risk Factors:Hypertension and ESRD.  Sonographer:    Koleen Popper RDCS Referring Phys: LYNWOOD SCHILLING IMPRESSIONS  1. Left ventricular ejection fraction, by estimation, is 40 to 45%. Left ventricular ejection fraction by 3D volume is 40 %. The left ventricle has mildly decreased function. The left ventricle demonstrates global hypokinesis. There is severe concentric  left ventricular hypertrophy. Left ventricular diastolic parameters are consistent with Grade II diastolic dysfunction (pseudonormalization).  2. Right ventricular systolic function is mildly reduced. The right ventricular size is normal. There is moderately elevated pulmonary artery systolic pressure. The estimated right ventricular systolic pressure is 45.7 mmHg.  3. Left  atrial size was mildly dilated.  4. Moderate pericardial effusion. The pericardial effusion is posterior to the left ventricle and localized near the right atrium. There is no evidence of cardiac tamponade.  5. The mitral valve is grossly normal. Mild mitral valve regurgitation. No evidence of mitral stenosis.  6. The aortic valve is tricuspid. Aortic valve regurgitation is not visualized. No aortic stenosis is present.  7. The inferior vena cava is dilated in size with >50% respiratory variability, suggesting right atrial pressure of 8 mmHg. Comparison(s): Changes from prior study are noted. The left ventricular function has improved. FINDINGS  Left Ventricle: Left ventricular ejection fraction, by estimation, is 40 to 45%. Left ventricular ejection fraction by 3D volume is 40 %. The left ventricle has mildly decreased function. The left ventricle demonstrates global hypokinesis. The left ventricular internal cavity size was normal in size. There is severe  concentric left ventricular hypertrophy. Left ventricular diastolic parameters are consistent with Grade II diastolic dysfunction (pseudonormalization). Right Ventricle: The right ventricular size is normal. No increase in right ventricular wall thickness. Right ventricular systolic function is mildly reduced. There is moderately elevated pulmonary artery systolic pressure. The tricuspid regurgitant velocity is 3.07 m/s, and with an assumed right atrial pressure of 8 mmHg, the estimated right ventricular systolic pressure is 45.7 mmHg. Left Atrium: Left atrial size was mildly dilated. Right Atrium: Right atrial size was normal in size. Pericardium: A moderately sized pericardial effusion is present. The pericardial effusion is posterior to the left ventricle and localized near the right atrium. There is no evidence of cardiac tamponade. Mitral Valve: The mitral valve is grossly normal. Mild mitral valve regurgitation. No evidence of mitral valve stenosis. Tricuspid Valve: The tricuspid valve is grossly normal. Tricuspid valve regurgitation is mild . No evidence of tricuspid stenosis. Aortic Valve: The aortic valve is tricuspid. Aortic valve regurgitation is not visualized. No aortic stenosis is present. Pulmonic Valve: The pulmonic valve was grossly normal. Pulmonic valve regurgitation is mild. No evidence of pulmonic stenosis. Aorta: The aortic root and ascending aorta are structurally normal, with no evidence of dilitation. Venous: The inferior vena cava is dilated in size with greater than 50% respiratory variability, suggesting right atrial pressure of 8 mmHg. IAS/Shunts: The atrial septum is grossly normal. Additional Comments: 3D was performed not requiring image post processing on an independent workstation and was abnormal.  LEFT VENTRICLE PLAX 2D LVIDd:         5.20 cm         Diastology LVIDs:         3.90 cm         LV e' medial:    4.87 cm/s LV PW:         1.70 cm         LV E/e' medial:  21.6 LV IVS:         1.50 cm         LV e' lateral:   4.87 cm/s LVOT diam:     1.90 cm         LV E/e' lateral: 21.6 LV SV:         61 LV SV Index:   34 LVOT Area:     2.84 cm        3D Volume EF                                LV 3D EF:  Left                                             ventricul LV Volumes (MOD)                            ar LV vol d, MOD    228.0 ml                   ejection A2C:                                        fraction LV vol d, MOD    238.0 ml                   by 3D A4C:                                        volume is LV vol s, MOD    138.0 ml                   40 %. A2C: LV vol s, MOD    134.0 ml A4C:                           3D Volume EF: LV SV MOD A2C:   90.0 ml       3D EF:        40 % LV SV MOD A4C:   238.0 ml      LV EDV:       257 ml LV SV MOD BP:    99.0 ml       LV ESV:       154 ml                                LV SV:        104 ml RIGHT VENTRICLE             IVC RV Basal diam:  4.40 cm     IVC diam: 2.70 cm RV Mid diam:    3.20 cm RV S prime:     11.90 cm/s TAPSE (M-mode): 2.2 cm LEFT ATRIUM             Index        RIGHT ATRIUM           Index LA diam:        5.00 cm 2.77 cm/m   RA Area:     21.40 cm LA Vol (A2C):   60.3 ml 33.39 ml/m  RA Volume:   62.00 ml  34.33 ml/m LA Vol (A4C):   67.7 ml 37.48 ml/m LA Biplane Vol: 65.6 ml 36.32 ml/m  AORTIC VALVE             PULMONIC VALVE LVOT Vmax:   122.00 cm/s PR End Diast Vel: 2.42 msec LVOT Vmean:  77.200 cm/s LVOT VTI:    0.214 m  AORTA Ao Root diam: 3.20 cm Ao Asc diam:  3.20 cm MITRAL VALVE  TRICUSPID VALVE MV Area (PHT): 2.83 cm     TR Peak grad:   37.7 mmHg MV Decel Time: 268 msec     TR Mean grad:   25.0 mmHg MR Peak grad: 53.9 mmHg     TR Vmax:        307.00 cm/s MR Vmax:      367.00 cm/s   TR Vmean:       240.0 cm/s MV E velocity: 105.00 cm/s MV A velocity: 67.30 cm/s   SHUNTS MV E/A ratio:  1.56         Systemic VTI:  0.21 m                             Systemic Diam: 1.90 cm Darryle Decent MD Electronically signed  by Darryle Decent MD Signature Date/Time: 09/17/2024/2:27:25 PM    Final        Nydia Distance M.D. [facility] 09/19/2024, 9:29 AM  Available via Epic secure chat 7am-7pm After 7 pm, please refer to night coverage provider listed on amion.    "

## 2024-09-19 NOTE — Anesthesia Procedure Notes (Signed)
 Procedure Name: LMA Insertion Date/Time: 09/19/2024 9:05 AM  Performed by: Lamar Lucie DASEN, CRNAPre-anesthesia Checklist: Patient identified, Emergency Drugs available, Suction available and Patient being monitored Patient Re-evaluated:Patient Re-evaluated prior to induction Oxygen Delivery Method: Circle system utilized Preoxygenation: Pre-oxygenation with 100% oxygen Induction Type: IV induction LMA: LMA inserted LMA Size: 4.0 Number of attempts: 1 Placement Confirmation: positive ETCO2, breath sounds checked- equal and bilateral and CO2 detector Tube secured with: Tape Dental Injury: Teeth and Oropharynx as per pre-operative assessment

## 2024-09-19 NOTE — Transfer of Care (Signed)
 Immediate Anesthesia Transfer of Care Note  Patient: Linda [name]  Procedure(s) Performed: IRRIGATION AND DEBRIDEMENT WOUND (Left) CLOSURE, WOUND, UPPER EXTREMITY, SECONDARY (Left)  Patient Location: PACU  Anesthesia Type:General  Level of Consciousness: drowsy and patient cooperative  Airway & Oxygen Therapy: Patient Spontanous Breathing and Patient connected to nasal cannula oxygen  Post-op Assessment: Report given to RN, Post -op Vital signs reviewed and stable, and Patient moving all extremities X 4  Post vital signs: Reviewed and stable  Last Vitals:  Vitals Value Taken Time  BP 122/90 09/19/24 09:45  Temp    Pulse 66 09/19/24 09:47  Resp 17 09/19/24 09:47  SpO2 100 % 09/19/24 09:47  Vitals shown include unfiled device data.  Last Pain:  Vitals:   09/19/24 0832  TempSrc:   PainSc: 10-Worst pain ever      Patients Stated Pain Goal: 4 (09/17/24 1232)  Complications: No notable events documented.

## 2024-09-19 NOTE — Progress Notes (Signed)
" °  Progress Note    09/19/2024 8:44 AM * Day of Surgery *  Subjective: no complaints ready for surgery  Vitals:   09/19/24 0352 09/19/24 0746  BP: (!) 140/103 (!) 127/94  Pulse: 75 71  Resp: 18 20  Temp: 97.6 F (36.4 C) 98.6 F (37 C)  SpO2: 100% 97%    Physical Exam: Incisions:  wrap in place Extremities:  left hand and arm swellin gmuch improved   CBC    Component Value Date/Time   WBC 14.2 (H) 09/19/2024 0338   RBC 3.14 (L) 09/19/2024 0338   HGB 8.8 (L) 09/19/2024 0338   HCT 29.1 (L) 09/19/2024 0338   PLT 269 09/19/2024 0338   MCV 92.7 09/19/2024 0338   MCH 28.0 09/19/2024 0338   MCHC 30.2 09/19/2024 0338   RDW 21.3 (H) 09/19/2024 0338   LYMPHSABS 2.0 09/13/2024 1454   MONOABS 1.2 (H) 09/13/2024 1454   EOSABS 0.1 09/13/2024 1454   BASOSABS 0.0 09/13/2024 1454    BMET    Component Value Date/Time   NA 128 (L) 09/19/2024 0338   NA 139 06/08/2017 1145   K 4.0 09/19/2024 0338   CL 91 (L) 09/19/2024 0338   CO2 26 09/19/2024 0338   GLUCOSE 87 09/19/2024 0338   BUN 34 (H) 09/19/2024 0338   BUN 12 06/08/2017 1145   CREATININE 4.87 (H) 09/19/2024 0338   CALCIUM  9.1 09/19/2024 0338   GFRNONAA 12 (L) 09/19/2024 0338   GFRAA 19 (L) 03/27/2019 1510    INR    Component Value Date/Time   INR 1.1 04/07/2024 1215     Intake/Output Summary (Last 24 hours) at 09/19/2024 0844 Last data filed at 09/19/2024 0500 Gross per 24 hour  Intake 560 ml  Output 3000 ml  Net -2440 ml      Assessment/Plan:  29 y.o. female is s/p:  Ligation of left arm AVF, I&D with Kerecis placement and vac 09/15/2024 by Dr. Lanis    -OR today for L arm incision washout and possible closure   Norman GORMAN Serve MD [facility] Phone Number: [phone] 09/19/2024 8:45 AM     "

## 2024-09-19 NOTE — Anesthesia Postprocedure Evaluation (Signed)
"   Anesthesia Post Note  Patient: Muskaan Smet  Procedure(s) Performed: IRRIGATION AND DEBRIDEMENT WOUND (Left) APPLICATION, WOUND VAC (Left: Arm Upper)     Patient location during evaluation: PACU Anesthesia Type: General Level of consciousness: awake and alert Pain management: pain level controlled Vital Signs Assessment: post-procedure vital signs reviewed and stable Respiratory status: spontaneous breathing, nonlabored ventilation, respiratory function stable and patient connected to nasal cannula oxygen Cardiovascular status: blood pressure returned to baseline and stable Postop Assessment: no apparent nausea or vomiting Anesthetic complications: no   No notable events documented.  Last Vitals:  Vitals:   09/19/24 1121 09/19/24 1241  BP: (!) 126/93 (!) 129/102  Pulse: 73 75  Resp: 18   Temp: 36.9 C 36.7 C  SpO2: 100% 100%    Last Pain:  Vitals:   09/19/24 1311  TempSrc:   PainSc: 6                  Jamani Bearce S      "

## 2024-09-20 ENCOUNTER — Other Ambulatory Visit: Payer: Self-pay | Admitting: Student

## 2024-09-20 ENCOUNTER — Encounter (HOSPITAL_COMMUNITY): Payer: Self-pay | Admitting: Vascular Surgery

## 2024-09-20 DIAGNOSIS — I3139 Other pericardial effusion (noninflammatory): Secondary | ICD-10-CM

## 2024-09-20 DIAGNOSIS — J189 Pneumonia, unspecified organism: Secondary | ICD-10-CM | POA: Diagnosis not present

## 2024-09-20 LAB — BASIC METABOLIC PANEL WITH GFR
Anion gap: 16 — ABNORMAL HIGH (ref 5–15)
BUN: 57 mg/dL — ABNORMAL HIGH (ref 6–20)
CO2: 23 mmol/L (ref 22–32)
Calcium: 9.6 mg/dL (ref 8.9–10.3)
Chloride: 88 mmol/L — ABNORMAL LOW (ref 98–111)
Creatinine, Ser: 6.97 mg/dL — ABNORMAL HIGH (ref 0.44–1.00)
GFR, Estimated: 8 mL/min — ABNORMAL LOW
Glucose, Bld: 119 mg/dL — ABNORMAL HIGH (ref 70–99)
Potassium: 4.5 mmol/L (ref 3.5–5.1)
Sodium: 128 mmol/L — ABNORMAL LOW (ref 135–145)

## 2024-09-20 LAB — CBC
HCT: 27 % — ABNORMAL LOW (ref 36.0–46.0)
Hemoglobin: 8.2 g/dL — ABNORMAL LOW (ref 12.0–15.0)
MCH: 28.2 pg (ref 26.0–34.0)
MCHC: 30.4 g/dL (ref 30.0–36.0)
MCV: 92.8 fL (ref 80.0–100.0)
Platelets: 307 K/uL (ref 150–400)
RBC: 2.91 MIL/uL — ABNORMAL LOW (ref 3.87–5.11)
RDW: 21.4 % — ABNORMAL HIGH (ref 11.5–15.5)
WBC: 15.1 K/uL — ABNORMAL HIGH (ref 4.0–10.5)
nRBC: 0 % (ref 0.0–0.2)

## 2024-09-20 LAB — AEROBIC/ANAEROBIC CULTURE W GRAM STAIN (SURGICAL/DEEP WOUND): Culture: NO GROWTH

## 2024-09-20 LAB — PHOSPHORUS: Phosphorus: 3.3 mg/dL (ref 2.5–4.6)

## 2024-09-20 MED ORDER — HEPARIN SODIUM (PORCINE) 1000 UNIT/ML IJ SOLN
INTRAMUSCULAR | Status: AC
  Filled 2024-09-20: qty 4

## 2024-09-20 MED ORDER — DIPHENOXYLATE-ATROPINE 2.5-0.025 MG PO TABS
1.0000 | ORAL_TABLET | Freq: Once | ORAL | Status: AC
  Administered 2024-09-20: 1 via ORAL
  Filled 2024-09-20: qty 1

## 2024-09-20 MED ORDER — HEPARIN SODIUM (PORCINE) 1000 UNIT/ML IJ SOLN
3800.0000 [IU] | Freq: Once | INTRAMUSCULAR | Status: AC
  Administered 2024-09-20: 3800 [IU]

## 2024-09-20 MED ORDER — HYDROMORPHONE HCL 1 MG/ML IJ SOLN
INTRAMUSCULAR | Status: AC
  Filled 2024-09-20: qty 1

## 2024-09-20 MED ORDER — HEPARIN SODIUM (PORCINE) 1000 UNIT/ML DIALYSIS: 1000.0000 [IU] | INTRAMUSCULAR | Status: DC | PRN

## 2024-09-20 MED ORDER — LIDOCAINE HCL (PF) 1 % IJ SOLN: 5.0000 mL | INTRAMUSCULAR | Status: DC | PRN

## 2024-09-20 MED ORDER — ALTEPLASE 2 MG IJ SOLR: 2.0000 mg | Freq: Once | INTRAMUSCULAR | Status: DC | PRN

## 2024-09-20 MED ORDER — CHLORHEXIDINE GLUCONATE CLOTH 2 % EX PADS
6.0000 | MEDICATED_PAD | Freq: Every day | CUTANEOUS | Status: DC
  Administered 2024-09-21: 6 via TOPICAL

## 2024-09-20 MED ORDER — LIDOCAINE-PRILOCAINE 2.5-2.5 % EX CREA: 1.0000 | TOPICAL_CREAM | CUTANEOUS | Status: DC | PRN

## 2024-09-20 MED ORDER — PENTAFLUOROPROP-TETRAFLUOROETH EX AERO: 1.0000 | INHALATION_SPRAY | CUTANEOUS | Status: DC | PRN

## 2024-09-20 NOTE — Plan of Care (Signed)
" °  Problem: Education: Goal: Knowledge of General Education information will improve Description: Including pain rating scale, medication(s)/side effects and non-pharmacologic comfort measures Outcome: Progressing   Problem: Health Behavior/Discharge Planning: Goal: Ability to manage health-related needs will improve Outcome: Progressing   Problem: Clinical Measurements: Goal: Ability to maintain clinical measurements within normal limits will improve Outcome: Progressing Goal: Will remain free from infection Outcome: Progressing Goal: Diagnostic test results will improve Outcome: Progressing Goal: Respiratory complications will improve Outcome: Progressing Goal: Cardiovascular complication will be avoided Outcome: Progressing   Problem: Activity: Goal: Risk for activity intolerance will decrease Outcome: Progressing   Problem: Nutrition: Goal: Adequate nutrition will be maintained Outcome: Progressing   Problem: Coping: Goal: Level of anxiety will decrease Outcome: Progressing   Problem: Elimination: Goal: Will not experience complications related to bowel motility Outcome: Progressing Goal: Will not experience complications related to urinary retention Outcome: Progressing   Problem: Pain Managment: Goal: General experience of comfort will improve and/or be controlled Outcome: Progressing   Problem: Safety: Goal: Ability to remain free from injury will improve Outcome: Progressing   Problem: Skin Integrity: Goal: Risk for impaired skin integrity will decrease Outcome: Progressing   Problem: Education: Goal: Knowledge of the prescribed therapeutic regimen will improve Outcome: Progressing   Problem: Bowel/Gastric: Goal: Gastrointestinal status for postoperative course will improve Outcome: Progressing   Problem: Cardiac: Goal: Ability to maintain an adequate cardiac output Outcome: Progressing Goal: Will show no evidence of cardiac arrhythmias Outcome:  Progressing   Problem: Nutritional: Goal: Will attain and maintain optimal nutritional status Outcome: Progressing   Problem: Neurological: Goal: Will regain or maintain usual level of consciousness Outcome: Progressing   Problem: Clinical Measurements: Goal: Ability to maintain clinical measurements within normal limits Outcome: Progressing Goal: Postoperative complications will be avoided or minimized Outcome: Progressing   Problem: Respiratory: Goal: Will regain and/or maintain adequate ventilation Outcome: Progressing Goal: Respiratory status will improve Outcome: Progressing   Problem: Skin Integrity: Goal: Demonstrates signs of wound healing without infection Outcome: Progressing   Problem: Urinary Elimination: Goal: Will remain free from infection Outcome: Progressing Goal: Ability to achieve and maintain adequate urine output Outcome: Progressing   "

## 2024-09-20 NOTE — Progress Notes (Signed)
 Orders for a limited outpatient echo in 2 to 4 weeks for pericardial effusion

## 2024-09-20 NOTE — Progress Notes (Signed)
 " Progress Note   Patient: Linda [name] FMW:[id number] DOB: 1996/01/04 DOA: 09/13/2024     6 DOS: the patient was seen and examined on 09/20/2024   Brief hospital course: Patient is a 29 year old female with hypertension, G6PD deficiency, CHF, lupus, ESRD on HD, MWF schedule, history of multiple AV fistula revisions presented with progressively worsening left arm swelling and pain since after recent AV fistula surgery on 09/06/2024 (Dr Serene).  She noted wound dehiscence about 3 days ago prior to admission.  No fever or chills, nausea vomiting or abdominal pain.  Patient is receiving dialysis via right IJ TDC. Vascular surgery was consulted.    Assessment and Plan: Left upper extremity edema and pain with ongoing drainage from incision after recent AV fistula surgery - Vascular surgery following, underwent left arm placed acute basilic fistula ligation, incision and debridement with wound VAC placement on 09/15/2024 Wound VAC has been taking off on 09/18/2024 Vascular surgeon took patient for left arm AV fistula washout     Chest pain with mildly elevated troponin - Last 2D echo 03/06/2024 showed EF of 30 to 35% with global hypokinesis, normal RV SF - Seen by cardiology, chest pain not anginal, troponins flat - CXR 1/17 showed increasing dense left lower lobe consolidation.  Continue IV vancomycin , cefepime  - CTA chest did not show acute PE.      ESRD on HD MWF -Currently receiving dialysis treatments via right IJ [facility] Continue hemodialysis as recommended by nephrology   Acute on Chronic systolic and diastolic HFrEF with volume overload --2D echo showed EF 40 to 45%, global hypokinesis, severe LVH, G2 DD, moderate pericardial effusion, no tamponade - Continue HD for volume management     Hypertension - Antihypertensives were placed on hold due to lower BP, now resumed. -  On hemodialysis, likely does not need Lasix , will defer to nephrology   GERD Continue PPI and  sucralfate .   Anemia of chronic disease H&H stable, baseline 9-10 - Hemoglobin 8.8, follow H&H postop   Hyponatremia  - likely due to volume overload - management with HD per renal      Code Status: Full code DVT Prophylaxis:  SCDs      Level of Care: Level of care: Telemetry Family Communication: Updated patient Disposition Plan:   Pending clearance by nephrologist  Subjective:  Patient seen and examined at bedside this morning during hemodialysis Denies nausea vomiting abdominal pain  Physical Exam:  General: Alert and oriented x 3, NAD Cardiovascular: S1 S2 clear, RRR.  Respiratory: CTAB, no wheezing Gastrointestinal: Soft, nontender, nondistended, NBS Ext: Left arm, hand swelling improving Neuro: no new deficits Psych: Normal affect   Data Reviewed:     Latest Ref Rng & Units 09/20/2024    3:50 AM 09/19/2024    3:38 AM 09/18/2024    3:38 AM  CBC  WBC 4.0 - 10.5 K/uL 15.1  14.2  27.3   Hemoglobin 12.0 - 15.0 g/dL 8.2  8.8  9.0   Hematocrit 36.0 - 46.0 % 27.0  29.1  29.7   Platelets 150 - 400 K/uL 307  269  303        Latest Ref Rng & Units 09/20/2024    3:50 AM 09/19/2024    3:38 AM 09/18/2024    3:38 AM  BMP  Glucose 70 - 99 mg/dL 880  87  894   BUN 6 - 20 mg/dL 57  34  52   Creatinine 0.44 - 1.00 mg/dL 3.02  5.12  3.16  Sodium 135 - 145 mmol/L 128  128  129   Potassium 3.5 - 5.1 mmol/L 4.5  4.0  3.7   Chloride 98 - 111 mmol/L 88  91  89   CO2 22 - 32 mmol/L 23  26  24    Calcium  8.9 - 10.3 mg/dL 9.6  9.1  9.5      Vitals:   09/20/24 1047 09/20/24 1100 09/20/24 1408 09/20/24 1414  BP: (!) 150/113 (!) 167/112 (!) 152/112 (!) 155/110  Pulse: 76 71 79 79  Resp: (!) 26 18 (!) 25 17  Temp:    98.3 F (36.8 C)  TempSrc:      SpO2: 95% 96% 98% 97%  Weight:    80.3 kg  Height:        Author: Drue ONEIDA Potter, MD 09/20/2024 3:26 PM  For on call review [email or web address].  "

## 2024-09-20 NOTE — Progress Notes (Signed)
" °   09/20/24 1414  Vitals  Temp 98.3 F (36.8 C)  Pulse Rate 79  Resp 17  BP (!) 155/110  SpO2 97 %  O2 Device Room Air  Weight 80.3 kg  Type of Weight Post-Dialysis  Oxygen Therapy  Patient Activity (if Appropriate) In bed  Pulse Oximetry Type Continuous  Oximetry Probe Site Changed No  Post Treatment  Dialyzer Clearance Lightly streaked  Liters Processed 76.4  Fluid Removed (mL) 2700 mL  Tolerated HD Treatment No (Comment)  Post-Hemodialysis Comments Pt. ended tx due to severe pain on her back and arm. Refused oxycodone     "

## 2024-09-20 NOTE — Progress Notes (Addendum)
" °  Progress Note    09/20/2024 8:13 AM 1 Day Post-Op  Subjective:  sitting up on side of bed, no major complaints   Vitals:   09/19/24 2007 09/20/24 0417  BP: (!) 145/102 (!) 146/109  Pulse: 79 74  Resp: 19 18  Temp: 98.4 F (36.9 C) 98 F (36.7 C)  SpO2: 100% 98%   Physical Exam: Cardiac:  regular Lungs:  non labored Incisions:  left arm with ACE dressing in place, VAC to suction Extremities:  2+ left radial pulse Neurologic: alert and oriented  CBC    Component Value Date/Time   WBC 15.1 (H) 09/20/2024 0350   RBC 2.91 (L) 09/20/2024 0350   HGB 8.2 (L) 09/20/2024 0350   HCT 27.0 (L) 09/20/2024 0350   PLT 307 09/20/2024 0350   MCV 92.8 09/20/2024 0350   MCH 28.2 09/20/2024 0350   MCHC 30.4 09/20/2024 0350   RDW 21.4 (H) 09/20/2024 0350   LYMPHSABS 2.0 09/13/2024 1454   MONOABS 1.2 (H) 09/13/2024 1454   EOSABS 0.1 09/13/2024 1454   BASOSABS 0.0 09/13/2024 1454    BMET    Component Value Date/Time   NA 128 (L) 09/20/2024 0350   NA 139 06/08/2017 1145   K 4.5 09/20/2024 0350   CL 88 (L) 09/20/2024 0350   CO2 23 09/20/2024 0350   GLUCOSE 119 (H) 09/20/2024 0350   BUN 57 (H) 09/20/2024 0350   BUN 12 06/08/2017 1145   CREATININE 6.97 (H) 09/20/2024 0350   CALCIUM  9.6 09/20/2024 0350   GFRNONAA 8 (L) 09/20/2024 0350   GFRAA 19 (L) 03/27/2019 1510    INR    Component Value Date/Time   INR 1.1 04/07/2024 1215     Intake/Output Summary (Last 24 hours) at 09/20/2024 0813 Last data filed at 09/20/2024 9376 Gross per 24 hour  Intake 1333.76 ml  Output 0 ml  Net 1333.76 ml     Assessment/Plan:  29 y.o. female is s/p Wound washout and VAC placement  1 Day Post-Op   Wound VAC change scheduled for Friday 1/23 with WOC RN Right arm well perfused and warm Vascular will follow intermittently She will need evaluation as outpatient for new access. Follow up message has been sent to our office to arrange this    Teretha Damme, PA-C [facility] [phone] 09/20/2024 8:13 AM   I agree with above.  VAC change with wound care on Friday. Norman GORMAN Serve MD [facility] Phone Number: [phone] 09/20/2024 11:31 AM  "

## 2024-09-20 NOTE — Progress Notes (Signed)
 " [facility] Progress Note   Subjective:    Seen and examined patient at bedside. Tolerated HD today with net UF 2.7L. She reports still feeling short-winded when talking. Currently not in acute respiratory distress. Plan for an extra HD tomorrow.  Objective Vitals:   09/20/24 1100 09/20/24 1408 09/20/24 1414 09/20/24 1629  BP: (!) 167/112 (!) 152/112 (!) 155/110 (!) 166/110  Pulse: 71 79 79 80  Resp: 18 (!) 25 17 18   Temp:   98.3 F (36.8 C) 98.3 F (36.8 C)  TempSrc:      SpO2: 96% 98% 97% 100%  Weight:   80.3 kg   Height:       Physical Exam General: Alert female; on RA; NAD Heart: RRR, no murmur Lungs: diminished bilateral lower lobes, clear in uppers, no rales, wheezing, or rhonchi Abdomen: Soft, non-distended Extremities: No edema b/l lower extremities Dialysis Access:  L arm wrapped, wound vac in place, [facility] Weights   09/19/24 0746 09/20/24 1035 09/20/24 1414  Weight: 80.7 kg 81.7 kg 80.3 kg    Intake/Output Summary (Last 24 hours) at 09/20/2024 1716 Last data filed at 09/20/2024 1414 Gross per 24 hour  Intake 716 ml  Output 2700 ml  Net -1984 ml    Additional Objective Labs: Basic Metabolic Panel: Recent Labs  Lab 09/18/24 0338 09/19/24 0338 09/20/24 0350  NA 129* 128* 128*  K 3.7 4.0 4.5  CL 89* 91* 88*  CO2 24 26 23   GLUCOSE 105* 87 119*  BUN 52* 34* 57*  CREATININE 6.83* 4.87* 6.97*  CALCIUM  9.5 9.1 9.6  PHOS  --   --  3.3   Liver Function Tests: No results for input(s): AST, ALT, ALKPHOS, BILITOT, PROT, ALBUMIN  in the last 168 hours. No results for input(s): LIPASE, AMYLASE in the last 168 hours. CBC: Recent Labs  Lab 09/16/24 0550 09/17/24 0404 09/18/24 0338 09/19/24 0338 09/20/24 0350  WBC 15.5* 18.7* 27.3* 14.2* 15.1*  HGB 8.5* 8.9* 9.0* 8.8* 8.2*  HCT 28.5* 29.0* 29.7* 29.1* 27.0*  MCV 92.2 92.4 92.5 92.7 92.8  PLT 249 260 303 269 307   Blood Culture    Component Value Date/Time   SDES  BLOOD RIGHT HAND 09/16/2024 1326   SPECREQUEST  09/16/2024 1326    BOTTLES DRAWN AEROBIC ONLY Blood Culture results may not be optimal due to an inadequate volume of blood received in culture bottles   CULT  09/16/2024 1326    NO GROWTH 4 DAYS Performed at [facility], [address] [place], [place] [address]    REPTSTATUS PENDING 09/16/2024 1326    Cardiac Enzymes: No results for input(s): CKTOTAL, CKMB, CKMBINDEX, TROPONINI in the last 168 hours. CBG: No results for input(s): GLUCAP in the last 168 hours. Iron  Studies: No results for input(s): IRON , TIBC, TRANSFERRIN, FERRITIN in the last 72 hours. Lab Results  Component Value Date   INR 1.1 04/07/2024   INR 1.2 10/10/2023   INR 1.0 03/27/2019   Studies/Results: No results found.  Medications:  ceFEPime  (MAXIPIME ) IV 1 g (09/20/24 1536)   vancomycin  Stopped (09/20/24 1522)    amLODipine   10 mg Oral QHS   carvedilol   25 mg Oral BID WC   Chlorhexidine  Gluconate Cloth  6 each Topical Q0600   cloNIDine   0.1 mg Oral Daily   furosemide   80 mg Oral Daily   irbesartan   300 mg Oral Daily   pantoprazole   40 mg Oral Daily   sucralfate   1  g Oral TID WC & HS    Dialysis Orders: MWF - [facility] 3.5hrs, BFR 400, DFR 500,  EDW 68.4kg, 2K/ 2Ca Heparin  3000 units bolus with HD Mircera 225 mcg q2wks - last 09/11/24 Hectorol  5mcg IV qHD - last 09/13/24 Sensipar  90mg  with HD - last 09/13/24  Assessment/Plan: AVF malfunction - VVS following. Hx multiple revisions in the past. Most recent revision 1/7. Scheduled ligation aborted today 2nd pt c/o CP. S/p ligation 1/16. S/p I&D with wound vac placement today by VVS. Chest Pain - Troponins elevated initially, cardiology felt not anginal. Pain today seems pleuritic in nature, CTA with volume overload.  ESRD -  on HD MWF. Not reaching EDW and signs off early in outpatient. Given CP and volume overload, s/p HD 1/15. Remains volume overloaded, 3L removed  1/19 and 2.7L removed 1/21. Plan for an extra HD tomorrow. Push UF as tolerated. Per patient, max UFG is 4L.  Hypertension/volume  - BP meds resumed, UF as tolerated with HD as above Anemia of CKD - ESA not due yet. Last given 1/12 in outpatient. Hgb 8.8- declined post op Secondary Hyperparathyroidism -  calcium  at goal, will check phos Nutrition - Renal diet with fluid restriction  Charmaine Piety, NP [facility] 09/20/2024,5:16 PM  LOS: 6 days    "

## 2024-09-20 NOTE — Progress Notes (Addendum)
 Pt. Came in awake and oriented with complaints of pain. See [facility] Consent verified and on file.  Started with no complaints  UF removal: Tx duration: 3.31 hours  Access used: Right CVC Access issue: None  Dressing due: 09/27/2024  Tx  ended as per  request due to severe pain on her back and arm Catheter locked and dwelled with heparin  Endorsed to floor nurse. Transported to room.   Philippe Gang Rubi Murial Beam, RN [facility]

## 2024-09-20 NOTE — Progress Notes (Signed)
" ° °  On allowable GDMT for HF - chest pain may be related to pericardial effusion which is localized. Suspect this will improve with dialysis. We will arrange for limited outpatient echo in 2-4 weeks and follow-up after.  [facility] will sign off.   Medication Recommendations:  as above Other recommendations (labs, testing, etc):  outpatient echo Follow up as an outpatient:  Dr. Wonda or APP  Vinie KYM Maxcy, MD, [facility], FNLA, FACP  [place]  [facility]  Medical Director of the [facility]  [facility] Diplomate of the American Board of Clinical Lipidology Attending Cardiologist  Direct Dial : [phone]  Fax: [phone]  Website:  [email or web address]   "

## 2024-09-21 DIAGNOSIS — J189 Pneumonia, unspecified organism: Secondary | ICD-10-CM | POA: Diagnosis not present

## 2024-09-21 LAB — PHOSPHORUS: Phosphorus: 3 mg/dL (ref 2.5–4.6)

## 2024-09-21 LAB — CULTURE, BLOOD (ROUTINE X 2)
Culture: NO GROWTH
Culture: NO GROWTH

## 2024-09-21 LAB — BASIC METABOLIC PANEL WITH GFR
Anion gap: 16 — ABNORMAL HIGH (ref 5–15)
BUN: 42 mg/dL — ABNORMAL HIGH (ref 6–20)
CO2: 24 mmol/L (ref 22–32)
Calcium: 9.6 mg/dL (ref 8.9–10.3)
Chloride: 92 mmol/L — ABNORMAL LOW (ref 98–111)
Creatinine, Ser: 4.99 mg/dL — ABNORMAL HIGH (ref 0.44–1.00)
GFR, Estimated: 11 mL/min — ABNORMAL LOW
Glucose, Bld: 83 mg/dL (ref 70–99)
Potassium: 3.9 mmol/L (ref 3.5–5.1)
Sodium: 133 mmol/L — ABNORMAL LOW (ref 135–145)

## 2024-09-21 LAB — CBC
HCT: 26.3 % — ABNORMAL LOW (ref 36.0–46.0)
Hemoglobin: 7.9 g/dL — ABNORMAL LOW (ref 12.0–15.0)
MCH: 27.8 pg (ref 26.0–34.0)
MCHC: 30 g/dL (ref 30.0–36.0)
MCV: 92.6 fL (ref 80.0–100.0)
Platelets: 381 K/uL (ref 150–400)
RBC: 2.84 MIL/uL — ABNORMAL LOW (ref 3.87–5.11)
RDW: 21.7 % — ABNORMAL HIGH (ref 11.5–15.5)
WBC: 14.3 K/uL — ABNORMAL HIGH (ref 4.0–10.5)
nRBC: 0 % (ref 0.0–0.2)

## 2024-09-21 MED ORDER — LIDOCAINE-PRILOCAINE 2.5-2.5 % EX CREA: 1.0000 | TOPICAL_CREAM | CUTANEOUS | Status: DC | PRN

## 2024-09-21 MED ORDER — LIDOCAINE HCL (PF) 1 % IJ SOLN: 5.0000 mL | INTRAMUSCULAR | Status: DC | PRN

## 2024-09-21 MED ORDER — CHLORHEXIDINE GLUCONATE CLOTH 2 % EX PADS
6.0000 | MEDICATED_PAD | Freq: Every day | CUTANEOUS | Status: DC
  Administered 2024-09-22 – 2024-09-26 (×5): 6 via TOPICAL

## 2024-09-21 MED ORDER — HYDROMORPHONE HCL 1 MG/ML IJ SOLN
INTRAMUSCULAR | Status: AC
  Filled 2024-09-21: qty 1

## 2024-09-21 MED ORDER — ALTEPLASE 2 MG IJ SOLR: 2.0000 mg | Freq: Once | INTRAMUSCULAR | Status: DC | PRN

## 2024-09-21 MED ORDER — HEPARIN SODIUM (PORCINE) 1000 UNIT/ML IJ SOLN
INTRAMUSCULAR | Status: AC
  Filled 2024-09-21: qty 4

## 2024-09-21 MED ORDER — PENTAFLUOROPROP-TETRAFLUOROETH EX AERO: 1.0000 | INHALATION_SPRAY | CUTANEOUS | Status: DC | PRN

## 2024-09-21 MED ORDER — HEPARIN SODIUM (PORCINE) 1000 UNIT/ML DIALYSIS: 1000.0000 [IU] | INTRAMUSCULAR | Status: DC | PRN

## 2024-09-21 NOTE — Progress Notes (Signed)
 " [facility] Progress Note   Subjective:    Seen and examined patient at bedside. Tolerated today's HD with net UF 4L. She's c/o ongoing CP when breathing on. Being treated for PNA. Next HD 1/23 per her routine schedule. VVS to change wound vac tomorrow.  Objective Vitals:   09/21/24 1425 09/21/24 1431 09/21/24 1443 09/21/24 1529  BP: (!) 136/104 (!) 134/95 (!) 135/96 (!) 140/94  Pulse:  73 72 79  Resp: 20 (!) 28 (!) 27 19  Temp:   98.4 F (36.9 C) 98 F (36.7 C)  TempSrc:      SpO2: 100% 100% 99% 100%  Weight:      Height:       Physical Exam General: Alert female; on RA; NAD Heart: RRR, no murmur Lungs: Clear anteriorly  Abdomen: Soft, non-distended Extremities: No edema b/l lower extremities Dialysis Access:  L arm wrapped-wound vac; [facility]  Filed Weights   09/20/24 1035 09/20/24 1414 09/21/24 1123  Weight: 81.7 kg 80.3 kg 85.1 kg    Intake/Output Summary (Last 24 hours) at 09/21/2024 1732 Last data filed at 09/21/2024 1443 Gross per 24 hour  Intake 600 ml  Output 4000 ml  Net -3400 ml    Additional Objective Labs: Basic Metabolic Panel: Recent Labs  Lab 09/19/24 0338 09/20/24 0350 09/21/24 0353  NA 128* 128* 133*  K 4.0 4.5 3.9  CL 91* 88* 92*  CO2 26 23 24   GLUCOSE 87 119* 83  BUN 34* 57* 42*  CREATININE 4.87* 6.97* 4.99*  CALCIUM  9.1 9.6 9.6  PHOS  --  3.3 3.0   Liver Function Tests: No results for input(s): AST, ALT, ALKPHOS, BILITOT, PROT, ALBUMIN  in the last 168 hours. No results for input(s): LIPASE, AMYLASE in the last 168 hours. CBC: Recent Labs  Lab 09/17/24 0404 09/18/24 0338 09/19/24 0338 09/20/24 0350 09/21/24 0353  WBC 18.7* 27.3* 14.2* 15.1* 14.3*  HGB 8.9* 9.0* 8.8* 8.2* 7.9*  HCT 29.0* 29.7* 29.1* 27.0* 26.3*  MCV 92.4 92.5 92.7 92.8 92.6  PLT 260 303 269 307 381   Blood Culture    Component Value Date/Time   SDES BLOOD RIGHT HAND 09/16/2024 1326   SPECREQUEST  09/16/2024 1326    BOTTLES  DRAWN AEROBIC ONLY Blood Culture results may not be optimal due to an inadequate volume of blood received in culture bottles   CULT  09/16/2024 1326    NO GROWTH 5 DAYS Performed at [facility], [address] [place], [place] [address]    REPTSTATUS 09/21/2024 FINAL 09/16/2024 1326    Cardiac Enzymes: No results for input(s): CKTOTAL, CKMB, CKMBINDEX, TROPONINI in the last 168 hours. CBG: No results for input(s): GLUCAP in the last 168 hours. Iron  Studies: No results for input(s): IRON , TIBC, TRANSFERRIN, FERRITIN in the last 72 hours. Lab Results  Component Value Date   INR 1.1 04/07/2024   INR 1.2 10/10/2023   INR 1.0 03/27/2019   Studies/Results: No results found.  Medications:  ceFEPime  (MAXIPIME ) IV 1 g (09/20/24 1536)   vancomycin  Stopped (09/20/24 1522)    amLODipine   10 mg Oral QHS   carvedilol   25 mg Oral BID WC   Chlorhexidine  Gluconate Cloth  6 each Topical Q0600   cloNIDine   0.1 mg Oral Daily   furosemide   80 mg Oral Daily   irbesartan   300 mg Oral Daily   pantoprazole   40 mg Oral Daily   sucralfate   1 g Oral TID WC & HS  Dialysis Orders: MWF - [facility] 3.5hrs, BFR 400, DFR 500,  EDW 68.4kg, 2K/ 2Ca Heparin  3000 units bolus with HD Mircera 225 mcg q2wks - last 09/11/24 Hectorol  5mcg IV qHD - last 09/13/24 Sensipar  90mg  with HD - last 09/13/24  Assessment/Plan: AVF malfunction - VVS following. Hx multiple revisions in the past. Most recent revision 1/7. Scheduled ligation aborted 2nd pt c/o CP. S/p ligation 1/16. S/p I&D with wound vac placement 1/20. Plan for VVS to change wound vac tomorrow. Chest Pain - Troponins elevated initially, cardiology felt not anginal. Pain today seems pleuritic in nature, CTA with volume overload.  ESRD -  on HD MWF. Not reaching EDW and signs off early in outpatient. Given CP and volume overload, s/p HD 1/15. Remains volume overloaded, 3L off 1/19, 2.7L off 1/21, and 4L off today. Next HD  1/23 per her routine schedule. Hypertension/volume  - BP meds resumed, UF as tolerated with HD as above Anemia of CKD - ESA due on 1/26. Secondary Hyperparathyroidism -  calcium  at goal and phos acceptable. Nutrition - Renal diet with fluid restriction  Charmaine Piety, NP [facility] 09/21/2024,5:32 PM  LOS: 7 days    "

## 2024-09-21 NOTE — Plan of Care (Signed)
" °  Problem: Education: Goal: Knowledge of General Education information will improve Description: Including pain rating scale, medication(s)/side effects and non-pharmacologic comfort measures Outcome: Progressing   Problem: Health Behavior/Discharge Planning: Goal: Ability to manage health-related needs will improve Outcome: Progressing   Problem: Clinical Measurements: Goal: Ability to maintain clinical measurements within normal limits will improve Outcome: Progressing Goal: Will remain free from infection Outcome: Progressing Goal: Diagnostic test results will improve Outcome: Progressing Goal: Respiratory complications will improve Outcome: Progressing Goal: Cardiovascular complication will be avoided Outcome: Progressing   Problem: Activity: Goal: Risk for activity intolerance will decrease Outcome: Progressing   Problem: Nutrition: Goal: Adequate nutrition will be maintained Outcome: Progressing   Problem: Coping: Goal: Level of anxiety will decrease Outcome: Progressing   Problem: Elimination: Goal: Will not experience complications related to bowel motility Outcome: Progressing Goal: Will not experience complications related to urinary retention Outcome: Progressing   Problem: Pain Managment: Goal: General experience of comfort will improve and/or be controlled Outcome: Progressing   Problem: Safety: Goal: Ability to remain free from injury will improve Outcome: Progressing   Problem: Skin Integrity: Goal: Risk for impaired skin integrity will decrease Outcome: Progressing   Problem: Education: Goal: Knowledge of the prescribed therapeutic regimen will improve Outcome: Progressing   Problem: Bowel/Gastric: Goal: Gastrointestinal status for postoperative course will improve Outcome: Progressing   Problem: Cardiac: Goal: Ability to maintain an adequate cardiac output Outcome: Progressing Goal: Will show no evidence of cardiac arrhythmias Outcome:  Progressing   Problem: Nutritional: Goal: Will attain and maintain optimal nutritional status Outcome: Progressing   Problem: Neurological: Goal: Will regain or maintain usual level of consciousness Outcome: Progressing   Problem: Clinical Measurements: Goal: Ability to maintain clinical measurements within normal limits Outcome: Progressing Goal: Postoperative complications will be avoided or minimized Outcome: Progressing   Problem: Respiratory: Goal: Will regain and/or maintain adequate ventilation Outcome: Progressing Goal: Respiratory status will improve Outcome: Progressing   Problem: Skin Integrity: Goal: Demonstrates signs of wound healing without infection Outcome: Progressing   Problem: Urinary Elimination: Goal: Will remain free from infection Outcome: Progressing Goal: Ability to achieve and maintain adequate urine output Outcome: Progressing   "

## 2024-09-21 NOTE — Progress Notes (Signed)
 " Progress Note   Patient: Linda [name] FMW:[id number] DOB: 22-Jul-1996 DOA: 09/13/2024     7 DOS: the patient was seen and examined on 09/21/2024     Brief hospital course: Patient is a 29 year old female with hypertension, G6PD deficiency, CHF, lupus, ESRD on HD, MWF schedule, history of multiple AV fistula revisions presented with progressively worsening left arm swelling and pain since after recent AV fistula surgery on 09/06/2024 (Dr Serene).  She noted wound dehiscence about 3 days ago prior to admission.  No fever or chills, nausea vomiting or abdominal pain.  Patient is receiving dialysis via right IJ TDC. Vascular surgery was consulted.     Assessment and Plan: Left upper extremity edema and pain with ongoing drainage from incision after recent AV fistula surgery - Vascular surgery following, underwent left arm placed acute basilic fistula ligation, incision and debridement with wound VAC placement on 09/15/2024 Vascular surgery on board Vascular surgeon took patient for left arm AV fistula washout     Chest pain with mildly elevated troponin - Last 2D echo 03/06/2024 showed EF of 30 to 35% with global hypokinesis, normal RV SF - Seen by cardiology, chest pain not anginal, troponins flat - CXR 1/17 showed increasing dense left lower lobe consolidation.  Continue IV vancomycin , cefepime  - CTA chest did not show acute PE.      ESRD on HD MWF -Currently receiving dialysis treatments via right IJ [facility] Continue hemodialysis as recommended by nephrology   Acute on Chronic systolic and diastolic HFrEF with volume overload --2D echo showed EF 40 to 45%, global hypokinesis, severe LVH, G2 DD, moderate pericardial effusion, no tamponade - Continue HD for volume management     Hypertension - Antihypertensives were placed on hold due to lower BP, now resumed. -  On hemodialysis, likely does not need Lasix , will defer to nephrology   GERD Continue PPI and sucralfate .   Anemia  of chronic disease H&H stable, baseline 9-10 - Hemoglobin 8.8, follow H&H postop   Hyponatremia  - likely due to volume overload - management with HD per renal      Code Status: Full code DVT Prophylaxis:  SCDs      Level of Care: Level of care: Telemetry Family Communication: Updated patient Disposition Plan:   Pending clearance by nephrologist   Subjective:  Seen and examined at bedside this morning Vascular surgeon planning to transition will plan tomorrow for possible discharge    Physical Exam:   General: Alert and oriented x 3, NAD Cardiovascular: S1 S2 clear, RRR.  Respiratory: CTAB, no wheezing Gastrointestinal: Soft, nontender, nondistended, NBS Ext: Left arm, hand swelling improving Neuro: no new deficits Psych: Normal affect    Data Reviewed:      Latest Ref Rng & Units 09/21/2024    3:53 AM 09/20/2024    3:50 AM 09/19/2024    3:38 AM  CBC  WBC 4.0 - 10.5 K/uL 14.3  15.1  14.2   Hemoglobin 12.0 - 15.0 g/dL 7.9  8.2  8.8   Hematocrit 36.0 - 46.0 % 26.3  27.0  29.1   Platelets 150 - 400 K/uL 381  307  269        Latest Ref Rng & Units 09/21/2024    3:53 AM 09/20/2024    3:50 AM 09/19/2024    3:38 AM  BMP  Glucose 70 - 99 mg/dL 83  880  87   BUN 6 - 20 mg/dL 42  57  34   Creatinine 0.44 -  1.00 mg/dL 5.00  3.02  5.12   Sodium 135 - 145 mmol/L 133  128  128   Potassium 3.5 - 5.1 mmol/L 3.9  4.5  4.0   Chloride 98 - 111 mmol/L 92  88  91   CO2 22 - 32 mmol/L 24  23  26    Calcium  8.9 - 10.3 mg/dL 9.6  9.6  9.1      Vitals:   09/21/24 1425 09/21/24 1431 09/21/24 1443 09/21/24 1529  BP: (!) 136/104 (!) 134/95 (!) 135/96 (!) 140/94  Pulse:  73 72 79  Resp: 20 (!) 28 (!) 27 19  Temp:   98.4 F (36.9 C) 98 F (36.7 C)  TempSrc:      SpO2: 100% 100% 99% 100%  Weight:      Height:         Author: Drue ONEIDA Potter, MD 09/21/2024 4:15 PM  For on call review [email or web address]  "

## 2024-09-21 NOTE — Progress Notes (Signed)
 Pt arrived to unit for tx.  Pt's R chest Catheter was used for 3hr tx. Pt completed tx with no issues. Pt received Dilaudid  during tx for chest pain. Pt transported back to room with no s/s of distress noted.  09/21/24 1443  Vitals  Temp 98.4 F (36.9 C)  Pulse Rate 72  Resp (!) 27  BP (!) 135/96  SpO2 99 %  O2 Device Nasal Cannula  Oxygen Therapy  O2 Flow Rate (L/min) 4 L/min  Patient Activity (if Appropriate) In bed  Pulse Oximetry Type Continuous  Oximetry Probe Site Changed No  Post Treatment  Dialyzer Clearance Lightly streaked  Hemodialysis Intake (mL) 0 mL  Liters Processed 72  Fluid Removed (mL) 4000 mL  Tolerated HD Treatment Yes

## 2024-09-22 DIAGNOSIS — J189 Pneumonia, unspecified organism: Secondary | ICD-10-CM | POA: Diagnosis not present

## 2024-09-22 LAB — BASIC METABOLIC PANEL WITH GFR
Anion gap: 15 (ref 5–15)
BUN: 38 mg/dL — ABNORMAL HIGH (ref 6–20)
CO2: 21 mmol/L — ABNORMAL LOW (ref 22–32)
Calcium: 9.5 mg/dL (ref 8.9–10.3)
Chloride: 92 mmol/L — ABNORMAL LOW (ref 98–111)
Creatinine, Ser: 4.49 mg/dL — ABNORMAL HIGH (ref 0.44–1.00)
GFR, Estimated: 13 mL/min — ABNORMAL LOW
Glucose, Bld: 79 mg/dL (ref 70–99)
Potassium: 5.5 mmol/L — ABNORMAL HIGH (ref 3.5–5.1)
Sodium: 128 mmol/L — ABNORMAL LOW (ref 135–145)

## 2024-09-22 LAB — VANCOMYCIN, RANDOM: Vancomycin Rm: 16 ug/mL

## 2024-09-22 LAB — PHOSPHORUS: Phosphorus: 3.5 mg/dL (ref 2.5–4.6)

## 2024-09-22 LAB — CBC
HCT: 26.2 % — ABNORMAL LOW (ref 36.0–46.0)
Hemoglobin: 8 g/dL — ABNORMAL LOW (ref 12.0–15.0)
MCH: 28.5 pg (ref 26.0–34.0)
MCHC: 30.5 g/dL (ref 30.0–36.0)
MCV: 93.2 fL (ref 80.0–100.0)
Platelets: 308 K/uL (ref 150–400)
RBC: 2.81 MIL/uL — ABNORMAL LOW (ref 3.87–5.11)
RDW: 22.1 % — ABNORMAL HIGH (ref 11.5–15.5)
WBC: 11.6 K/uL — ABNORMAL HIGH (ref 4.0–10.5)
nRBC: 0 % (ref 0.0–0.2)

## 2024-09-22 MED ORDER — HEPARIN SODIUM (PORCINE) 1000 UNIT/ML IJ SOLN
3000.0000 [IU] | Freq: Once | INTRAMUSCULAR | Status: AC
  Administered 2024-09-22: 3000 [IU] via INTRAVENOUS

## 2024-09-22 MED ORDER — SODIUM ZIRCONIUM CYCLOSILICATE 10 G PO PACK
10.0000 g | PACK | Freq: Once | ORAL | Status: AC
  Administered 2024-09-22: 10 g via ORAL
  Filled 2024-09-22: qty 1

## 2024-09-22 MED ORDER — HEPARIN SODIUM (PORCINE) 1000 UNIT/ML DIALYSIS
1000.0000 [IU] | INTRAMUSCULAR | Status: DC | PRN
  Administered 2024-09-23: 3800 [IU] via INTRAVENOUS_CENTRAL

## 2024-09-22 MED ORDER — PENTAFLUOROPROP-TETRAFLUOROETH EX AERO: 1.0000 | INHALATION_SPRAY | CUTANEOUS | Status: DC | PRN

## 2024-09-22 MED ORDER — HEPARIN SODIUM (PORCINE) 1000 UNIT/ML IJ SOLN
INTRAMUSCULAR | Status: AC
  Filled 2024-09-22: qty 3

## 2024-09-22 MED ORDER — VANCOMYCIN HCL 750 MG/150ML IV SOLN
INTRAVENOUS | Status: AC
  Filled 2024-09-22: qty 150

## 2024-09-22 MED ORDER — HEPARIN SODIUM (PORCINE) 1000 UNIT/ML IJ SOLN
INTRAMUSCULAR | Status: AC
  Filled 2024-09-22: qty 4

## 2024-09-22 MED ORDER — LIDOCAINE-PRILOCAINE 2.5-2.5 % EX CREA: 1.0000 | TOPICAL_CREAM | CUTANEOUS | Status: DC | PRN

## 2024-09-22 MED ORDER — LIDOCAINE HCL (PF) 1 % IJ SOLN: 5.0000 mL | INTRAMUSCULAR | Status: DC | PRN

## 2024-09-22 MED ORDER — DARBEPOETIN ALFA 150 MCG/0.3ML IJ SOSY: 150.0000 ug | PREFILLED_SYRINGE | INTRAMUSCULAR | Status: DC

## 2024-09-22 MED ORDER — ALTEPLASE 2 MG IJ SOLR: 2.0000 mg | Freq: Once | INTRAMUSCULAR | Status: DC | PRN

## 2024-09-22 MED ORDER — HYDROMORPHONE HCL 1 MG/ML IJ SOLN
INTRAMUSCULAR | Status: AC
  Filled 2024-09-22: qty 1

## 2024-09-22 NOTE — Care Management Important Message (Signed)
 Important Message  Patient Details  Name: Deannie Resetar MRN: [id number] Date of Birth: 1996/05/19   Important Message Given:  Yes - Medicare IM     Vonzell Arrie Sharps 09/22/2024, 3:08 PM

## 2024-09-22 NOTE — Progress Notes (Signed)
 " Progress Note   Patient: Linda [name] FMW:[id number] DOB: Sep 25, 1995 DOA: 09/13/2024     8 DOS: the patient was seen and examined on 09/22/2024      Brief hospital course: Patient is a 29 year old female with hypertension, G6PD deficiency, CHF, lupus, ESRD on HD, MWF schedule, history of multiple AV fistula revisions presented with progressively worsening left arm swelling and pain since after recent AV fistula surgery on 09/06/2024 (Dr Serene).  She noted wound dehiscence about 3 days ago prior to admission.  No fever or chills, nausea vomiting or abdominal pain.  Patient is receiving dialysis via right IJ TDC. Vascular surgery was consulted.     Assessment and Plan: Left upper extremity edema and pain with ongoing drainage from incision after recent AV fistula surgery - Vascular surgery following, underwent left arm placed acute basilic fistula ligation, incision and debridement with wound VAC placement on 09/15/2024 Vascular surgery on board Vascular surgeon took patient for left arm AV fistula washout with wound VAC placement. Wound VAC was changed by wound care today Vascular surgery planning new access as an outpatient     Chest pain with mildly elevated troponin - Last 2D echo 03/06/2024 showed EF of 30 to 35% with global hypokinesis, normal RV SF - Seen by cardiology, chest pain not anginal, troponins flat - CXR 1/17 showed increasing dense left lower lobe consolidation.  Continue IV vancomycin , cefepime  to complete at least 7 days course - CTA chest did not show acute PE.      ESRD on HD MWF Hyperkalemia -Currently receiving dialysis treatments via right IJ [facility] Continue hemodialysis as recommended by nephrology   Acute on Chronic systolic and diastolic HFrEF with volume overload --2D echo showed EF 40 to 45%, global hypokinesis, severe LVH, G2 DD, moderate pericardial effusion, no tamponade - Continue HD for volume management     Hypertension Continue current  antihypertensives including amlodipine , carvedilol , clonidine  and Lasix     GERD Continue PPI and sucralfate .   Anemia of chronic disease Hemoglobin stable-continue to monitor   Hyponatremia  - likely due to volume overload - management with HD per renal      Code Status: Full code DVT Prophylaxis:  SCDs      Level of Care: Level of care: Telemetry Family Communication: Updated patient Disposition Plan:   Pending clearance by nephrologist   Subjective:  Seen and examined at bedside this morning Patient did complain of shortness of breath today Chest pain improved, has a cough but denied nausea vomiting or abdominal pain   Physical Exam:   General: Alert and oriented x 3, NAD Cardiovascular: S1 S2 clear, RRR.  Respiratory: Decreased air entry noted at the bases bilaterally Gastrointestinal: Soft, nontender, nondistended, NBS Ext: Left arm, hand swelling improving with wound VAC in place Neuro: no new deficits Psych: Normal affect    Data Reviewed:       Latest Ref Rng & Units 09/22/2024    6:03 AM 09/21/2024    3:53 AM 09/20/2024    3:50 AM  CBC  WBC 4.0 - 10.5 K/uL 11.6  14.3  15.1   Hemoglobin 12.0 - 15.0 g/dL 8.0  7.9  8.2   Hematocrit 36.0 - 46.0 % 26.2  26.3  27.0   Platelets 150 - 400 K/uL 308  381  307        Latest Ref Rng & Units 09/22/2024    6:03 AM 09/21/2024    3:53 AM 09/20/2024    3:50 AM  BMP  Glucose 70 - 99 mg/dL 79  83  880   BUN 6 - 20 mg/dL 38  42  57   Creatinine 0.44 - 1.00 mg/dL 5.50  5.00  3.02   Sodium 135 - 145 mmol/L 128  133  128   Potassium 3.5 - 5.1 mmol/L 5.5  3.9  4.5   Chloride 98 - 111 mmol/L 92  92  88   CO2 22 - 32 mmol/L 21  24  23    Calcium  8.9 - 10.3 mg/dL 9.5  9.6  9.6     Vitals:   09/21/24 2013 09/22/24 0414 09/22/24 0802 09/22/24 0947  BP: (!) 134/95 (!) 131/94 (!) 143/92 (!) 143/92  Pulse: 91 93 87   Resp: 18 18 18    Temp: 97.6 F (36.4 C) 98.9 F (37.2 C) 98.3 F (36.8 C)   TempSrc:   Oral   SpO2: 94% 91%  95%   Weight:      Height:        Author: Drue ONEIDA Potter, MD 09/22/2024 12:33 PM  For on call review [email or web address]  "

## 2024-09-22 NOTE — Progress Notes (Addendum)
" °  Progress Note    09/22/2024 7:25 AM 3 Days Post-Op  Subjective:  no complaints   Vitals:   09/21/24 2013 09/22/24 0414  BP: (!) 134/95 (!) 131/94  Pulse: 91 93  Resp: 18 18  Temp: 97.6 F (36.4 C) 98.9 F (37.2 C)  SpO2: 94% 91%   Physical Exam: Lungs:  non labored Incisions:  dressing left in place L arm Extremities:  palpable L radial pulse Neurologic: A&O  CBC    Component Value Date/Time   WBC 11.6 (H) 09/22/2024 0603   RBC 2.81 (L) 09/22/2024 0603   HGB 8.0 (L) 09/22/2024 0603   HCT 26.2 (L) 09/22/2024 0603   PLT 308 09/22/2024 0603   MCV 93.2 09/22/2024 0603   MCH 28.5 09/22/2024 0603   MCHC 30.5 09/22/2024 0603   RDW 22.1 (H) 09/22/2024 0603   LYMPHSABS 2.0 09/13/2024 1454   MONOABS 1.2 (H) 09/13/2024 1454   EOSABS 0.1 09/13/2024 1454   BASOSABS 0.0 09/13/2024 1454    BMET    Component Value Date/Time   NA 133 (L) 09/21/2024 0353   NA 139 06/08/2017 1145   K 3.9 09/21/2024 0353   CL 92 (L) 09/21/2024 0353   CO2 24 09/21/2024 0353   GLUCOSE 83 09/21/2024 0353   BUN 42 (H) 09/21/2024 0353   BUN 12 06/08/2017 1145   CREATININE 4.99 (H) 09/21/2024 0353   CALCIUM  9.6 09/21/2024 0353   GFRNONAA 11 (L) 09/21/2024 0353   GFRAA 19 (L) 03/27/2019 1510    INR    Component Value Date/Time   INR 1.1 04/07/2024 1215     Intake/Output Summary (Last 24 hours) at 09/22/2024 0725 Last data filed at 09/22/2024 0421 Gross per 24 hour  Intake 1020 ml  Output 4000 ml  Net -2980 ml     Assessment/Plan:  29 y.o. female is s/p ligation of L arm AVF 3 Days Post-Op   Edema improved L arm.  Subjectively feeling better.  WOC RN consulted for vac change today.  HD again today per Nephrology.  New access can be arranged as an outpatient.   Donnice Sender, PA-C [facility] [phone] 09/22/2024 7:25 AM    "

## 2024-09-22 NOTE — TOC Progression Note (Signed)
 Transition of Care [facility]) - Progression Note    Patient Details  Name: Linda [name] MRN: [id number] Date of Birth: 12-06-1995  Transition of [facility]) CM/SW Contact  Corean JAYSON Canary, RN Phone Number: 09/22/2024, 3:53 PM  Clinical Narrative:     Spoke with hospitalist and vascular. The patient will require a wound vac for home. She lives with her grandmother, will need Home health RN for dressing changes, reached out to Alston from [employer] ( formerly [facility] M) to process home vac. Due to insurance, this may need to go through synapse. He sent the [facility] to  [facility] for vascular. He will receive by email and esign.  She may discharge tomorrow pending this process. Sent out for Home health RN in [place] 1840 Wound vac has been approved according to [facility].                   Expected Discharge Plan and Services                                               Social Drivers of Health (SDOH) Interventions SDOH Screenings   Food Insecurity: No Food Insecurity (09/14/2024)  Housing: Low Risk (09/14/2024)  Transportation Needs: No Transportation Needs (09/14/2024)  Utilities: Not At Risk (09/14/2024)  Alcohol Screen: Low Risk (02/03/2024)  Depression (PHQ2-9): Low Risk (02/03/2024)  Recent Concern: Depression (PHQ2-9) - Medium Risk (01/26/2024)  Financial Resource Strain: Low Risk (02/03/2024)  Physical Activity: Insufficiently Active (02/03/2024)  Social Connections: Moderately Isolated (09/14/2024)  Stress: No Stress Concern Present (02/03/2024)  Tobacco Use: High Risk (09/19/2024)  Health Literacy: Adequate Health Literacy (02/03/2024)    Readmission Risk Interventions    09/15/2024    2:40 PM 11/15/2023    2:53 PM 10/25/2023    3:32 PM  Readmission Risk Prevention Plan  Transportation Screening Complete Complete Complete  HRI or Home Care Consult   Complete  Social Work Consult for Recovery Care Planning/Counseling   Complete  Palliative Care Screening   Complete   Medication Review [facility]) Referral to Pharmacy Referral to Pharmacy Referral to Pharmacy  PCP or Specialist appointment within 3-5 days of discharge Complete Complete   HRI or Home Care Consult Complete Complete   SW Recovery Care/Counseling Consult Complete Complete   Palliative Care Screening Not Applicable Not Applicable   Skilled Nursing Facility Not Applicable Not Applicable

## 2024-09-22 NOTE — Consult Note (Addendum)
" ° ° °  CLINICAL SUPPORT TEAM - WOUND OSTOMY AND CONTINENCE TEAM  CONSULTATION SERVICES   WOC Nurse-Inpatient Note  WOC Nurse Consult Note: Reason for Consult: Consult requested to change left arm Vac.  Pt is followed by the vascular team.  Left arm with full thickness post-op wound; 90% yellow, 10% red, small amt yellow drainage in the cannister, 2X4.2X.4cm.  Sutures visible to inner wound. Pt was medicated for pain prior to the procedure but was very apprehensive and crying.  Tolerated with mod amt discomfort. Applied one piece black foam to cont suction.    2 sets of supplies ordered to the room for next change.   WOC team will plan to change dressing again on Tues.   Thank-you,  Stephane Fought MSN, RN, CWOCN, CWCN-AP, CNS Contact Mon-Fri 0700-1500: [phone]     "

## 2024-09-22 NOTE — Progress Notes (Signed)
" °  [facility] Progress Note   Subjective:   Seen in room. Breathing and arm both feeling better. No CP or abd pain.  Objective Vitals:   09/21/24 2013 09/22/24 0414 09/22/24 0802 09/22/24 0947  BP: (!) 134/95 (!) 131/94 (!) 143/92 (!) 143/92  Pulse: 91 93 87   Resp: 18 18 18    Temp: 97.6 F (36.4 C) 98.9 F (37.2 C) 98.3 F (36.8 C)   TempSrc:   Oral   SpO2: 94% 91% 95%   Weight:      Height:       Physical Exam General: Well appearing, NAD. Room air Heart: RRR Lungs: CTAB Abdomen: soft Extremities: 2+ BLE edema Dialysis Access: TDC, L arm bandaged  Additional Objective Labs: Basic Metabolic Panel: Recent Labs  Lab 09/20/24 0350 09/21/24 0353 09/22/24 0603  NA 128* 133* 128*  K 4.5 3.9 5.5*  CL 88* 92* 92*  CO2 23 24 21*  GLUCOSE 119* 83 79  BUN 57* 42* 38*  CREATININE 6.97* 4.99* 4.49*  CALCIUM  9.6 9.6 9.5  PHOS 3.3 3.0 3.5   CBC: Recent Labs  Lab 09/18/24 0338 09/19/24 0338 09/20/24 0350 09/21/24 0353 09/22/24 0603  WBC 27.3* 14.2* 15.1* 14.3* 11.6*  HGB 9.0* 8.8* 8.2* 7.9* 8.0*  HCT 29.7* 29.1* 27.0* 26.3* 26.2*  MCV 92.5 92.7 92.8 92.6 93.2  PLT 303 269 307 381 308   Medications:  ceFEPime  (MAXIPIME ) IV 1 g (09/21/24 1737)   vancomycin  Stopped (09/20/24 1522)    amLODipine   10 mg Oral QHS   carvedilol   25 mg Oral BID WC   Chlorhexidine  Gluconate Cloth  6 each Topical Q0600   cloNIDine   0.1 mg Oral Daily   furosemide   80 mg Oral Daily   irbesartan   300 mg Oral Daily   pantoprazole   40 mg Oral Daily   sucralfate   1 g Oral TID WC & HS    Dialysis Orders MWF - [facility] 3.5hrs, BFR 400, DFR 500,  EDW 68.4kg, 2K/ 2Ca Heparin  3000 units bolus with HD Mircera 225 mcg q2wks - last 09/11/24 Hectorol  5mcg IV qHD - last 09/13/24 Sensipar  90mg  with HD - last 09/13/24   Assessment/Plan: AVF malfunction - VVS following. Hx multiple revisions in the past, last 1/7. Scheduled ligation aborted 2nd pt c/o CP. S/p ligation  1/16. S/p I&D with wound vac placement 1/20. On Vanc/Cefepime . Chest Pain - Troponins elevated initially, cardiology felt not anginal. Pain today seems pleuritic in nature, CTA with volume overload.  ESRD -  on HD MWF. Overload on exam and imaging. UF as tolerated. For HD today. Hypertension/volume  - BP meds resumed, UF as tolerated with HD as above Anemia of CKD - Hgb 8, will order Aranesp  to next week. Secondary Hyperparathyroidism -  Ca/Phos ok - follow without binders. Nutrition - Renal diet with fluid restriction GERD - Has been getting PPI + Sucralfate  x 1 week - will stop the sucralfate  due to ESRD     Izetta Boehringer, PA-C 09/22/2024, 12:09 PM  [facility]    "

## 2024-09-23 DIAGNOSIS — T82898A Other specified complication of vascular prosthetic devices, implants and grafts, initial encounter: Secondary | ICD-10-CM | POA: Diagnosis not present

## 2024-09-23 LAB — CBC
HCT: 24.5 % — ABNORMAL LOW (ref 36.0–46.0)
Hemoglobin: 7.4 g/dL — ABNORMAL LOW (ref 12.0–15.0)
MCH: 27.2 pg (ref 26.0–34.0)
MCHC: 30.2 g/dL (ref 30.0–36.0)
MCV: 90.1 fL (ref 80.0–100.0)
Platelets: 350 10*3/uL (ref 150–400)
RBC: 2.72 MIL/uL — ABNORMAL LOW (ref 3.87–5.11)
RDW: 21.3 % — ABNORMAL HIGH (ref 11.5–15.5)
WBC: 8.1 10*3/uL (ref 4.0–10.5)
nRBC: 0 % (ref 0.0–0.2)

## 2024-09-23 LAB — BASIC METABOLIC PANEL WITH GFR
Anion gap: 14 (ref 5–15)
BUN: 25 mg/dL — ABNORMAL HIGH (ref 6–20)
CO2: 26 mmol/L (ref 22–32)
Calcium: 9.8 mg/dL (ref 8.9–10.3)
Chloride: 90 mmol/L — ABNORMAL LOW (ref 98–111)
Creatinine, Ser: 3.27 mg/dL — ABNORMAL HIGH (ref 0.44–1.00)
GFR, Estimated: 19 mL/min — ABNORMAL LOW
Glucose, Bld: 74 mg/dL (ref 70–99)
Potassium: 4.4 mmol/L (ref 3.5–5.1)
Sodium: 130 mmol/L — ABNORMAL LOW (ref 135–145)

## 2024-09-23 LAB — VITAMIN D 25 HYDROXY (VIT D DEFICIENCY, FRACTURES): Vit D, 25-Hydroxy: 15 ng/mL — ABNORMAL LOW (ref 30–100)

## 2024-09-23 LAB — VITAMIN B12: Vitamin B-12: 632 pg/mL (ref 180–914)

## 2024-09-23 NOTE — Progress Notes (Signed)
" °  [name] Progress Note   Subjective:   Seen in room. S/p HD yesterday with 4L off, still volume up but better. Wound vac in place for L arm, WBC improving. No CP.  Objective Vitals:   09/22/24 2357 09/23/24 0051 09/23/24 0420 09/23/24 0739  BP: 133/87 (!) 136/93 133/81 (!) 130/93  Pulse: 88 88 90 84  Resp: (!) 29 16 17 18   Temp: 98.6 F (37 C) 98.5 F (36.9 C) 98.6 F (37 C) 98.3 F (36.8 C)  TempSrc:      SpO2: 95% 98% (!) 89% 92%  Weight: 74.2 kg     Height:       Physical Exam General: Well appearing, NAD. Room air Heart: RRR Lungs: CTAB Abdomen: soft Extremities: 1+ BLE edema Dialysis Access: TDC, L arm bandaged  Additional Objective Labs: Basic Metabolic Panel: Recent Labs  Lab 09/20/24 0350 09/21/24 0353 09/22/24 0603 09/23/24 0637  NA 128* 133* 128* 130*  K 4.5 3.9 5.5* 4.4  CL 88* 92* 92* 90*  CO2 23 24 21* 26  GLUCOSE 119* 83 79 74  BUN 57* 42* 38* 25*  CREATININE 6.97* 4.99* 4.49* 3.27*  CALCIUM  9.6 9.6 9.5 9.8  PHOS 3.3 3.0 3.5  --    CBC: Recent Labs  Lab 09/19/24 0338 09/20/24 0350 09/21/24 0353 09/22/24 0603 09/23/24 0637  WBC 14.2* 15.1* 14.3* 11.6* 8.1  HGB 8.8* 8.2* 7.9* 8.0* 7.4*  HCT 29.1* 27.0* 26.3* 26.2* 24.5*  MCV 92.7 92.8 92.6 93.2 90.1  PLT 269 307 381 308 350   Medications:  ceFEPime  (MAXIPIME ) IV 1 g (09/22/24 1409)   vancomycin  750 mg (09/22/24 2250)    amLODipine   10 mg Oral QHS   carvedilol   25 mg Oral BID WC   Chlorhexidine  Gluconate Cloth  6 each Topical Q0600   cloNIDine   0.1 mg Oral Daily   [START ON 09/27/2024] darbepoetin (ARANESP ) injection - DIALYSIS  150 mcg Subcutaneous Q Wed-1800   furosemide   80 mg Oral Daily   irbesartan   300 mg Oral Daily   pantoprazole   40 mg Oral Daily    Dialysis Orders MWF - [facility] 3.5hrs, BFR 400, DFR 500,  EDW 68.4kg, 2K/ 2Ca, Heparin  3000 units bolus - Mircera 225 mcg q2wks - last 09/11/24 - Hectorol  5mcg IV qHD - last 09/13/24 -  Sensipar  90mg  with HD - last 09/13/24   Assessment/Plan: AVF malfunction - VVS following. Hx multiple revisions in the past, last 1/7. Scheduled ligation aborted 2nd pt c/o CP. S/p ligation 1/16. S/p I&D with wound vac placement 1/20. On Vanc/Cefepime . Chest Pain - Troponins elevated initially, cardiology felt not anginal. CTA with volume overload.  ESRD -  on HD MWF. Overload on exam and imaging. UF as tolerated. Next HD Monday 1/26 Hypertension/volume  - BP meds resumed, UF as tolerated with HD as above Anemia of CKD - Hgb 7.4, Aranesp  will be resumed this week Secondary Hyperparathyroidism -  Ca/Phos ok - follow without binders. Nutrition - Renal diet with fluid restriction GERD - Had been getting sucralfate , stopped. Continue PPI   [name], PA-C 09/23/2024, 9:16 AM  [facility]    "

## 2024-09-23 NOTE — Plan of Care (Signed)
" °  Problem: Education: Goal: Knowledge of General Education information will improve Description: Including pain rating scale, medication(s)/side effects and non-pharmacologic comfort measures Outcome: Progressing   Problem: Health Behavior/Discharge Planning: Goal: Ability to manage health-related needs will improve Outcome: Progressing   Problem: Clinical Measurements: Goal: Ability to maintain clinical measurements within normal limits will improve Outcome: Progressing Goal: Will remain free from infection Outcome: Progressing Goal: Diagnostic test results will improve Outcome: Progressing Goal: Respiratory complications will improve Outcome: Progressing Goal: Cardiovascular complication will be avoided Outcome: Progressing   Problem: Activity: Goal: Risk for activity intolerance will decrease Outcome: Progressing   Problem: Nutrition: Goal: Adequate nutrition will be maintained Outcome: Progressing   Problem: Coping: Goal: Level of anxiety will decrease Outcome: Progressing   Problem: Elimination: Goal: Will not experience complications related to bowel motility Outcome: Progressing Goal: Will not experience complications related to urinary retention Outcome: Progressing   Problem: Pain Managment: Goal: General experience of comfort will improve and/or be controlled Outcome: Progressing   Problem: Safety: Goal: Ability to remain free from injury will improve Outcome: Progressing   Problem: Skin Integrity: Goal: Risk for impaired skin integrity will decrease Outcome: Progressing   Problem: Education: Goal: Knowledge of the prescribed therapeutic regimen will improve Outcome: Progressing   Problem: Bowel/Gastric: Goal: Gastrointestinal status for postoperative course will improve Outcome: Progressing   Problem: Cardiac: Goal: Ability to maintain an adequate cardiac output Outcome: Progressing Goal: Will show no evidence of cardiac arrhythmias Outcome:  Progressing   Problem: Nutritional: Goal: Will attain and maintain optimal nutritional status Outcome: Progressing   Problem: Neurological: Goal: Will regain or maintain usual level of consciousness Outcome: Progressing   Problem: Clinical Measurements: Goal: Ability to maintain clinical measurements within normal limits Outcome: Progressing Goal: Postoperative complications will be avoided or minimized Outcome: Progressing   Problem: Respiratory: Goal: Will regain and/or maintain adequate ventilation Outcome: Progressing Goal: Respiratory status will improve Outcome: Progressing   Problem: Skin Integrity: Goal: Demonstrates signs of wound healing without infection Outcome: Progressing   Problem: Urinary Elimination: Goal: Will remain free from infection Outcome: Progressing Goal: Ability to achieve and maintain adequate urine output Outcome: Progressing   "

## 2024-09-23 NOTE — Progress Notes (Signed)
" °   09/22/24 2357  Vitals  Temp 98.6 F (37 C)  Pulse Rate 88  Resp (!) 29  BP 133/87  SpO2 95 %  O2 Device Room Air  Weight 74.2 kg  Type of Weight Actual  Oxygen Therapy  Patient Activity (if Appropriate) In bed  Pulse Oximetry Type Continuous  Oximetry Probe Site Changed No  Post Treatment  Dialyzer Clearance Clear  Hemodialysis Intake (mL) 0 mL  Liters Processed 84  Fluid Removed (mL) 4000 mL  Tolerated HD Treatment Yes  Post-Hemodialysis Comments Dressing changed per protocol   Patient medicated with Dilaudid  1mg  for c/o pain during HD run. Given ordered Vancomycin  750 mg w/o adverse reactions.  "

## 2024-09-23 NOTE — Progress Notes (Signed)
 " Progress Note   Patient: Linda [name] FMW:[id number] DOB: August 05, 1996 DOA: 09/13/2024     9 DOS: the patient was seen and examined on 09/23/2024      Brief hospital course: Patient is a 29 year old female with hypertension, G6PD deficiency, CHF, lupus, ESRD on HD, MWF schedule, history of multiple AV fistula revisions presented with progressively worsening left arm swelling and pain since after recent AV fistula surgery on 09/06/2024 (Dr Serene).  She noted wound dehiscence about 3 days ago prior to admission.  No fever or chills, nausea vomiting or abdominal pain.  Patient is receiving dialysis via right IJ TDC. Vascular surgery was consulted.     Assessment and Plan:  # Left upper extremity edema and pain with ongoing drainage from incision after recent AV fistula surgery - Vascular surgery following, underwent left arm placed acute basilic fistula ligation, incision and debridement with wound VAC placement on 09/15/2024 Vascular surgery on board Vascular surgeon took patient for left arm AV fistula washout with wound VAC placement. Wound VAC was changed by wound care on Friday 1/23 Vascular surgery planning new access as an outpatient     Chest pain with mildly elevated troponin - Last 2D echo 03/06/2024 showed EF of 30 to 35% with global hypokinesis, normal RV SF - Seen by cardiology, chest pain not anginal, troponins flat - CXR 1/17 showed increasing dense left lower lobe consolidation.  S/p IV vancomycin , cefepime  completed x 7 days course, d/c'd on 1/24 - CTA chest did not show acute PE.      ESRD on HD MWF Hyperkalemia -Currently receiving dialysis treatments via right IJ [facility] Continue hemodialysis as recommended by nephrology   Acute on Chronic systolic and diastolic HFrEF with volume overload --2D echo showed EF 40 to 45%, global hypokinesis, severe LVH, G2 DD, moderate pericardial effusion, no tamponade - Continue HD for volume management      Hypertension Continue current antihypertensives including amlodipine , carvedilol , clonidine  and Lasix     GERD Continue PPI and sucralfate .   Anemia of chronic disease Hemoglobin stable-continue to monitor 1/24 Hb 7.4 Monitor H&H and transfuse if hemoglobin less than 7   Hyponatremia  - likely due to volume overload - management with HD per renal      Code Status: Full code DVT Prophylaxis:  SCDs      Level of Care: Level of care: Telemetry Family Communication: Updated patient Disposition Plan:   Pending clearance by nephrologist   Subjective:  Seen and examined at bedside this morning Patient is still having some shortness of breath and cough with phlegm production, no any other complaints Overall she is feeling improvement.    Physical Exam:   General: Alert and oriented x 3, NAD Cardiovascular: S1 S2 clear, RRR.  Respiratory: Good air entry bilaterally, bilateral crackles, no significant wheezes appreciated. Gastrointestinal: Soft, nontender, nondistended, NBS Ext: Left arm, hand swelling improving with wound VAC in place Neuro: no new deficits Psych: Normal affect    Data Reviewed:       Latest Ref Rng & Units 09/23/2024    6:37 AM 09/22/2024    6:03 AM 09/21/2024    3:53 AM  CBC  WBC 4.0 - 10.5 K/uL 8.1  11.6  14.3   Hemoglobin 12.0 - 15.0 g/dL 7.4  8.0  7.9   Hematocrit 36.0 - 46.0 % 24.5  26.2  26.3   Platelets 150 - 400 K/uL 350  308  381        Latest Ref Rng &  Units 09/23/2024    6:37 AM 09/22/2024    6:03 AM 09/21/2024    3:53 AM  BMP  Glucose 70 - 99 mg/dL 74  79  83   BUN 6 - 20 mg/dL 25  38  42   Creatinine 0.44 - 1.00 mg/dL 6.72  5.50  5.00   Sodium 135 - 145 mmol/L 130  128  133   Potassium 3.5 - 5.1 mmol/L 4.4  5.5  3.9   Chloride 98 - 111 mmol/L 90  92  92   CO2 22 - 32 mmol/L 26  21  24    Calcium  8.9 - 10.3 mg/dL 9.8  9.5  9.6     Vitals:   09/23/24 0051 09/23/24 0420 09/23/24 0739 09/23/24 1537  BP: (!) 136/93 133/81 (!) 130/93  (!) 132/96  Pulse: 88 90 84 83  Resp: 16 17 18    Temp: 98.5 F (36.9 C) 98.6 F (37 C) 98.3 F (36.8 C)   TempSrc:      SpO2: 98% (!) 89% 92% 96%  Weight:      Height:        Author: Elvan Sor, MD 09/23/2024 4:27 PM  For on call review [email or web address]  "

## 2024-09-24 ENCOUNTER — Inpatient Hospital Stay (HOSPITAL_COMMUNITY)

## 2024-09-24 DIAGNOSIS — T82898A Other specified complication of vascular prosthetic devices, implants and grafts, initial encounter: Secondary | ICD-10-CM | POA: Diagnosis not present

## 2024-09-24 LAB — BASIC METABOLIC PANEL WITH GFR
Anion gap: 15 (ref 5–15)
BUN: 41 mg/dL — ABNORMAL HIGH (ref 6–20)
CO2: 25 mmol/L (ref 22–32)
Calcium: 9.8 mg/dL (ref 8.9–10.3)
Chloride: 89 mmol/L — ABNORMAL LOW (ref 98–111)
Creatinine, Ser: 4.74 mg/dL — ABNORMAL HIGH (ref 0.44–1.00)
GFR, Estimated: 12 mL/min — ABNORMAL LOW
Glucose, Bld: 85 mg/dL (ref 70–99)
Potassium: 4.8 mmol/L (ref 3.5–5.1)
Sodium: 129 mmol/L — ABNORMAL LOW (ref 135–145)

## 2024-09-24 LAB — CBC
HCT: 23.7 % — ABNORMAL LOW (ref 36.0–46.0)
Hemoglobin: 7.1 g/dL — ABNORMAL LOW (ref 12.0–15.0)
MCH: 27.3 pg (ref 26.0–34.0)
MCHC: 30 g/dL (ref 30.0–36.0)
MCV: 91.2 fL (ref 80.0–100.0)
Platelets: 331 10*3/uL (ref 150–400)
RBC: 2.6 MIL/uL — ABNORMAL LOW (ref 3.87–5.11)
RDW: 21.5 % — ABNORMAL HIGH (ref 11.5–15.5)
WBC: 7.9 10*3/uL (ref 4.0–10.5)
nRBC: 0 % (ref 0.0–0.2)

## 2024-09-24 LAB — PREPARE RBC (CROSSMATCH)

## 2024-09-24 LAB — PHOSPHORUS: Phosphorus: 5.8 mg/dL — ABNORMAL HIGH (ref 2.5–4.6)

## 2024-09-24 LAB — MAGNESIUM: Magnesium: 2 mg/dL (ref 1.7–2.4)

## 2024-09-24 MED ORDER — CALCITRIOL 0.5 MCG PO CAPS: 0.5000 ug | ORAL_CAPSULE | Freq: Every day | ORAL | Status: DC

## 2024-09-24 MED ORDER — DARBEPOETIN ALFA 200 MCG/0.4ML IJ SOSY
200.0000 ug | PREFILLED_SYRINGE | INTRAMUSCULAR | Status: AC
  Filled 2024-09-24 (×3): qty 0.4

## 2024-09-24 MED ORDER — DOXERCALCIFEROL 4 MCG/2ML IV SOLN
5.0000 ug | INTRAVENOUS | Status: DC
  Administered 2024-09-25: 5 ug via INTRAVENOUS
  Filled 2024-09-24 (×3): qty 4

## 2024-09-24 MED ORDER — HYDROMORPHONE HCL 1 MG/ML IJ SOLN
1.0000 mg | Freq: Once | INTRAMUSCULAR | Status: AC
  Administered 2024-09-24: 1 mg via INTRAVENOUS
  Filled 2024-09-24: qty 1

## 2024-09-24 MED ORDER — SODIUM CHLORIDE 0.9% IV SOLUTION: Freq: Once | INTRAVENOUS | Status: DC

## 2024-09-24 MED ORDER — SEVELAMER CARBONATE 2.4 G PO PACK
2.4000 g | PACK | Freq: Three times a day (TID) | ORAL | Status: AC
  Administered 2024-09-24 – 2024-10-06 (×25): 2.4 g via ORAL
  Filled 2024-09-24 (×39): qty 1

## 2024-09-24 NOTE — Progress Notes (Signed)
 " Progress Note   Patient: Linda [name] FMW:[id number] DOB: 07/28/1996 DOA: 09/13/2024     10 DOS: the patient was seen and examined on 09/24/2024      Brief hospital course: Patient is a 29 year old female with hypertension, G6PD deficiency, CHF, lupus, ESRD on HD, MWF schedule, history of multiple AV fistula revisions presented with progressively worsening left arm swelling and pain since after recent AV fistula surgery on 09/06/2024 (Dr Serene).  She noted wound dehiscence about 3 days ago prior to admission.  No fever or chills, nausea vomiting or abdominal pain.  Patient is receiving dialysis via right IJ TDC. Vascular surgery was consulted.     Assessment and Plan:  # Left upper extremity edema and pain with ongoing drainage from incision after recent AV fistula surgery - Vascular surgery following, underwent left arm placed acute basilic fistula ligation, incision and debridement with wound VAC placement on 09/15/2024 Vascular surgery on board Vascular surgeon took patient for left arm AV fistula washout with wound VAC placement. Wound VAC was changed by wound care on Friday 1/23 Vascular surgery planning new access as an outpatient 1/25 c/o Left arm swelling and pain.  Advised to keep it elevated and we will continue as needed pain meds.  Seen by nephrology and trying to get in touch with vascular surgery Follow left arm ultrasound   # Chest pain with mildly elevated troponin - Last 2D echo 03/06/2024 showed EF of 30 to 35% with global hypokinesis, normal RV SF - Seen by cardiology, chest pain not anginal, troponins flat - CXR 1/17 showed increasing dense left lower lobe consolidation.  S/p IV vancomycin , cefepime  completed x 7 days course, d/c'd on 1/24 - CTA chest did not show acute PE.      ESRD on HD MWF Hyperkalemia: resolved s/p HD -Currently receiving dialysis treatments via right IJ [facility] Continue hemodialysis as recommended by nephrology   Acute on Chronic  systolic and diastolic HFrEF with volume overload --2D echo showed EF 40 to 45%, global hypokinesis, severe LVH, G2 DD, moderate pericardial effusion, no tamponade - Continue HD for volume management     Hypertension Continue current antihypertensives including amlodipine , carvedilol , clonidine  and Lasix     GERD Continue PPI and sucralfate .   Anemia of chronic disease Hemoglobin stable-continue to monitor 1/25 Hb 7.4 >7.1 Monitor H&H and transfuse if hemoglobin less than 7 1/25 patient will get 1 unit of PRBC transfusion tomorrow a.m. with hemodialysis    Hyponatremia  - likely due to volume overload - management with HD per renal    Vitamin D deficiency: Vit D level 15, started doxercalciferol  5 mcg IV every MWF, follow with PCP to repeat vitamin D level after 3 to 6 months.    Code Status: Full code DVT Prophylaxis:  SCDs      Level of Care: Level of care: Telemetry Family Communication: Updated patient Disposition Plan:   Pending clearance by nephrologist   Subjective:  Seen and examined at bedside this morning C/o swelling and pain in the left arm started last night.  Seen by nocturnist, ultrasound was ordered Patient was advised to keep left arm elevated and we will continue as needed pain meds. In the meantime we are trying to get in touch with vascular surgery, nephrology paged vascular surgery as well for     Physical Exam:   General: Alert and oriented x 3, NAD Cardiovascular: S1 S2 clear, RRR.  Respiratory: Good air entry bilaterally, mild b/l crackles, no significant wheezes.  Gastrointestinal: Soft, nontender, nondistended, NBS Ext: Left arm swelling got worse on 1/25, wound VAC intact  Neuro: no new deficits Psych: seems stressed due to Lt arm swelling    Data Reviewed:       Latest Ref Rng & Units 09/24/2024    6:27 AM 09/23/2024    6:37 AM 09/22/2024    6:03 AM  CBC  WBC 4.0 - 10.5 K/uL 7.9  8.1  11.6   Hemoglobin 12.0 - 15.0 g/dL 7.1  7.4  8.0    Hematocrit 36.0 - 46.0 % 23.7  24.5  26.2   Platelets 150 - 400 K/uL 331  350  308        Latest Ref Rng & Units 09/24/2024    6:27 AM 09/23/2024    6:37 AM 09/22/2024    6:03 AM  BMP  Glucose 70 - 99 mg/dL 85  74  79   BUN 6 - 20 mg/dL 41  25  38   Creatinine 0.44 - 1.00 mg/dL 5.25  6.72  5.50   Sodium 135 - 145 mmol/L 129  130  128   Potassium 3.5 - 5.1 mmol/L 4.8  4.4  5.5   Chloride 98 - 111 mmol/L 89  90  92   CO2 22 - 32 mmol/L 25  26  21    Calcium  8.9 - 10.3 mg/dL 9.8  9.8  9.5     Vitals:   09/23/24 0739 09/23/24 1537 09/23/24 2227 09/24/24 0910  BP: (!) 130/93 (!) 132/96 (!) 142/90 (!) 136/94  Pulse: 84 83 84 75  Resp: 18  18 16   Temp: 98.3 F (36.8 C)  98.4 F (36.9 C) 98.1 F (36.7 C)  TempSrc:      SpO2: 92% 96% 98% 95%  Weight:      Height:        Author: Elvan Sor, MD 09/24/2024 2:07 PM  For on call review [email or web address]  "

## 2024-09-24 NOTE — Progress Notes (Signed)
 " [facility] Progress Note   Subjective:   Seen in room. Having L arm pain and worsened hand edema. Helped her prop up/elevate the arm. Will arrange vascular eval, wound vac remains in place. For HD tomorrow, she is volume up everywhere.  Objective Vitals:   09/23/24 0739 09/23/24 1537 09/23/24 2227 09/24/24 0910  BP: (!) 130/93 (!) 132/96 (!) 142/90 (!) 136/94  Pulse: 84 83 84 75  Resp: 18  18 16   Temp: 98.3 F (36.8 C)  98.4 F (36.9 C) 98.1 F (36.7 C)  TempSrc:      SpO2: 92% 96% 98% 95%  Weight:      Height:       Physical Exam General: Well appearing, NAD. Room air Heart: RRR Lungs: CTAB Abdomen: soft Extremities: 2+ BLE edema Dialysis Access: TDC, L arm bandaged, 1+ hand edema  Additional Objective Labs: Basic Metabolic Panel: Recent Labs  Lab 09/21/24 0353 09/22/24 0603 09/23/24 0637 09/24/24 0627  NA 133* 128* 130* 129*  K 3.9 5.5* 4.4 4.8  CL 92* 92* 90* 89*  CO2 24 21* 26 25  GLUCOSE 83 79 74 85  BUN 42* 38* 25* 41*  CREATININE 4.99* 4.49* 3.27* 4.74*  CALCIUM  9.6 9.5 9.8 9.8  PHOS 3.0 3.5  --  5.8*   CBC: Recent Labs  Lab 09/20/24 0350 09/21/24 0353 09/22/24 0603 09/23/24 0637 09/24/24 0627  WBC 15.1* 14.3* 11.6* 8.1 7.9  HGB 8.2* 7.9* 8.0* 7.4* 7.1*  HCT 27.0* 26.3* 26.2* 24.5* 23.7*  MCV 92.8 92.6 93.2 90.1 91.2  PLT 307 381 308 350 331   Blood Culture    Component Value Date/Time   SDES BLOOD RIGHT HAND 09/16/2024 1326   SPECREQUEST  09/16/2024 1326    BOTTLES DRAWN AEROBIC ONLY Blood Culture results may not be optimal due to an inadequate volume of blood received in culture bottles   CULT  09/16/2024 1326    NO GROWTH 5 DAYS Performed at [facility], [address] [place], [place] [address]    REPTSTATUS 09/21/2024 FINAL 09/16/2024 1326   Medications:   sodium chloride    Intravenous Once   amLODipine   10 mg Oral QHS   calcitRIOL   0.5 mcg Oral Daily   carvedilol   25 mg Oral BID WC   Chlorhexidine   Gluconate Cloth  6 each Topical Q0600   cloNIDine   0.1 mg Oral Daily   [START ON 09/27/2024] darbepoetin (ARANESP ) injection - DIALYSIS  150 mcg Subcutaneous Q Wed-1800   furosemide   80 mg Oral Daily   irbesartan   300 mg Oral Daily   pantoprazole   40 mg Oral Daily    Dialysis Orders MWF - [facility] 3.5hrs, BFR 400, DFR 500,  EDW 68.4kg, 2K/ 2Ca, Heparin  3000 units bolus - Mircera 225 mcg q2wks - last 09/11/24 - Hectorol  5mcg IV qHD - last 09/13/24 - Sensipar  90mg  with HD - last 09/13/24   Assessment/Plan: AVF malfunction - VVS following. Hx multiple revisions in the past, last 1/7. Scheduled ligation aborted 2nd pt c/o CP. S/p ligation 1/16. S/p I&D with wound vac placement 1/20. On Vanc/Cefepime . More L hand edema today, assisted her to elevate. Chest Pain - Troponins elevated initially, cardiology felt not anginal. CTA with volume overload.  ESRD -  on HD MWF. Remains overload on exam and imaging. Next HD Monday 1/26 Hypertension/volume  - BP meds resumed, UF as tolerated with HD as above Anemia of CKD - Hgb 7.1, for Aranesp  today. Secondary  Hyperparathyroidism -  Ca ok, Phos high - restart binders (sevelamer  pwdr), adjus VDRA to home med. Nutrition - Renal diet with fluid restriction GERD - Had been getting sucralfate , stopped. Continue PPI     Katie Laryssa Hassing, PA-C 09/24/2024, 9:49 AM  [facility]    "

## 2024-09-24 NOTE — Progress Notes (Signed)
 Patient refused Aranesp , she said I dont like shots.  Explained the benefits but patient refused.

## 2024-09-24 NOTE — Progress Notes (Signed)
 [facility] night cross cover note:   I was notified by the patient's RN that the patient is reporting additional edema in the left upper extremity and that the patient is continuing concern for potential recurrence of previous infection in the vicinity of her left upper extremity AV fistula, which prompted I&D with wound VAC placement on 09/15/2024.  Arm conveys palpable radial pulse on left upper extremity.  Patient with some diminished range of motion with movement limited by associated discomfort.  Per brief chart review, vascular surgery continues to follow.  This history, will reassess for drainable fluid collection.  Initially, I have placed order for nonvascular ultrasound of the left upper extremity to further assess.      Eva Pore, DO Hospitalist

## 2024-09-25 ENCOUNTER — Inpatient Hospital Stay (HOSPITAL_COMMUNITY)

## 2024-09-25 DIAGNOSIS — M79602 Pain in left arm: Secondary | ICD-10-CM

## 2024-09-25 DIAGNOSIS — T82898A Other specified complication of vascular prosthetic devices, implants and grafts, initial encounter: Secondary | ICD-10-CM | POA: Diagnosis not present

## 2024-09-25 LAB — CBC
HCT: 24.7 % — ABNORMAL LOW (ref 36.0–46.0)
Hemoglobin: 7.4 g/dL — ABNORMAL LOW (ref 12.0–15.0)
MCH: 27 pg (ref 26.0–34.0)
MCHC: 30 g/dL (ref 30.0–36.0)
MCV: 90.1 fL (ref 80.0–100.0)
Platelets: 348 10*3/uL (ref 150–400)
RBC: 2.74 MIL/uL — ABNORMAL LOW (ref 3.87–5.11)
RDW: 21.3 % — ABNORMAL HIGH (ref 11.5–15.5)
WBC: 8.4 10*3/uL (ref 4.0–10.5)
nRBC: 0 % (ref 0.0–0.2)

## 2024-09-25 LAB — BASIC METABOLIC PANEL WITH GFR
Anion gap: 18 — ABNORMAL HIGH (ref 5–15)
BUN: 52 mg/dL — ABNORMAL HIGH (ref 6–20)
CO2: 22 mmol/L (ref 22–32)
Calcium: 9.8 mg/dL (ref 8.9–10.3)
Chloride: 88 mmol/L — ABNORMAL LOW (ref 98–111)
Creatinine, Ser: 6.23 mg/dL — ABNORMAL HIGH (ref 0.44–1.00)
GFR, Estimated: 9 mL/min — ABNORMAL LOW
Glucose, Bld: 73 mg/dL (ref 70–99)
Potassium: 5.7 mmol/L — ABNORMAL HIGH (ref 3.5–5.1)
Sodium: 128 mmol/L — ABNORMAL LOW (ref 135–145)

## 2024-09-25 MED ORDER — HEPARIN SODIUM (PORCINE) 1000 UNIT/ML IJ SOLN
INTRAMUSCULAR | Status: AC
  Filled 2024-09-25: qty 4

## 2024-09-25 MED ORDER — HYDROMORPHONE HCL 1 MG/ML IJ SOLN
INTRAMUSCULAR | Status: AC
  Filled 2024-09-25: qty 0.5

## 2024-09-25 MED ORDER — DOXERCALCIFEROL 4 MCG/2ML IV SOLN
INTRAVENOUS | Status: AC
  Filled 2024-09-25: qty 2

## 2024-09-25 MED ORDER — OXYCODONE HCL 5 MG PO TABS
ORAL_TABLET | ORAL | Status: AC
  Filled 2024-09-25: qty 1

## 2024-09-25 MED ORDER — CALCIUM CARBONATE ANTACID 500 MG PO CHEW
1.0000 | CHEWABLE_TABLET | Freq: Once | ORAL | Status: AC
  Administered 2024-09-25: 200 mg via ORAL
  Filled 2024-09-25: qty 1

## 2024-09-25 MED ORDER — HEPARIN SODIUM (PORCINE) 1000 UNIT/ML IJ SOLN
3800.0000 [IU] | Freq: Once | INTRAMUSCULAR | Status: AC
  Administered 2024-09-25: 3800 [IU]

## 2024-09-25 NOTE — Progress Notes (Signed)
" °  Progress Note    09/25/2024 8:26 AM 6 Days Post-Op  Subjective:  reports continued edema in left arm    Vitals:   09/24/24 2050 09/25/24 0523  BP: (!) 144/98   Pulse: 76 79  Resp: 19 17  Temp: 98.3 F (36.8 C) 98.4 F (36.9 C)  SpO2:  95%    Physical Exam: General:  sitting up in bed, NAD Lungs:  nonlabored Incisions:  L arm with wound vac with good seal, serosanguineous output Extremities:  edema of left arm, palpable radial pulse  CBC    Component Value Date/Time   WBC 8.4 09/25/2024 0303   RBC 2.74 (L) 09/25/2024 0303   HGB 7.4 (L) 09/25/2024 0303   HCT 24.7 (L) 09/25/2024 0303   PLT 348 09/25/2024 0303   MCV 90.1 09/25/2024 0303   MCH 27.0 09/25/2024 0303   MCHC 30.0 09/25/2024 0303   RDW 21.3 (H) 09/25/2024 0303   LYMPHSABS 2.0 09/13/2024 1454   MONOABS 1.2 (H) 09/13/2024 1454   EOSABS 0.1 09/13/2024 1454   BASOSABS 0.0 09/13/2024 1454    BMET    Component Value Date/Time   NA 128 (L) 09/25/2024 0303   NA 139 06/08/2017 1145   K 5.7 (H) 09/25/2024 0303   CL 88 (L) 09/25/2024 0303   CO2 22 09/25/2024 0303   GLUCOSE 73 09/25/2024 0303   BUN 52 (H) 09/25/2024 0303   BUN 12 06/08/2017 1145   CREATININE 6.23 (H) 09/25/2024 0303   CALCIUM  9.8 09/25/2024 0303   GFRNONAA 9 (L) 09/25/2024 0303   GFRAA 19 (L) 03/27/2019 1510    INR    Component Value Date/Time   INR 1.1 04/07/2024 1215     Intake/Output Summary (Last 24 hours) at 09/25/2024 0826 Last data filed at 09/24/2024 1830 Gross per 24 hour  Intake 360 ml  Output 0 ml  Net 360 ml      Assessment/Plan:  29 y.o. female is 6 days post op, s/p: ligation of left arm AVF  -She reports that her left arm swelling got worse again over the weekend. Nonvascular US  of the LUE demonstrates subcutaneous edema without abscess. Will order repeat LUE DVT study -L arm with wound vac with good seal, continue wound vac changes with WOC RN -LUE remains well perfused with palpable radial pulse -Low  concern for infection given she remains afebrile without leukocytosis   Ahmed Holster, PA-C [facility] [phone] 09/25/2024 8:26 AM    "

## 2024-09-25 NOTE — Progress Notes (Signed)
 " Progress Note   Patient: Linda [name] FMW:[id number] DOB: 1996/01/19 DOA: 09/13/2024     11 DOS: the patient was seen and examined on 09/25/2024      Brief hospital course: Patient is a 28 year old female with hypertension, G6PD deficiency, CHF, lupus, ESRD on HD, MWF schedule, history of multiple AV fistula revisions presented with progressively worsening left arm swelling and pain since after recent AV fistula surgery on 09/06/2024 (Dr Serene).  She noted wound dehiscence about 3 days ago prior to admission.  No fever or chills, nausea vomiting or abdominal pain.  Patient is receiving dialysis via right IJ TDC. Vascular surgery was consulted.     Assessment and Plan:  # Left upper extremity edema and pain with ongoing drainage from incision after recent AV fistula surgery - Vascular surgery following, underwent left arm placed acute basilic fistula ligation, incision and debridement with wound VAC placement on 09/15/2024 Vascular surgery on board Vascular surgeon took patient for left arm AV fistula washout with wound VAC placement. Wound VAC was changed by wound care on Friday 1/23 Vascular surgery planning new access as an outpatient 1/25 c/o Left arm swelling and pain.  Advised to keep it elevated and we will continue as needed pain meds.  Seen by nephrology and trying to get in touch with vascular surgery Left arm US : 1. No loculated subcutaneous fluid collection identified. 2. Tubular foreign body, likely representing a residual arteriovenous graft or chronic thrombosis of an arteriovenous fistula, not fully assessed on this examination. 1/26 edema improved, still has significant pain, patient was seen by vascular surgery   # Chest pain with mildly elevated troponin - Last 2D echo 03/06/2024 showed EF of 30 to 35% with global hypokinesis, normal RV SF - Seen by cardiology, chest pain not anginal, troponins flat - CXR 1/17 showed increasing dense left lower lobe  consolidation.  S/p IV vancomycin , cefepime  completed x 7 days course, d/c'd on 1/24 - CTA chest did not show acute PE.      ESRD on HD MWF Hyperkalemia: resolved s/p HD -Currently receiving dialysis treatments via right IJ [facility] Continue hemodialysis as recommended by nephrology   Acute on Chronic systolic and diastolic HFrEF with volume overload --2D echo showed EF 40 to 45%, global hypokinesis, severe LVH, G2 DD, moderate pericardial effusion, no tamponade - Continue HD for volume management     Hypertension Continue current antihypertensives including amlodipine , carvedilol , clonidine  and Lasix     GERD Continue PPI and sucralfate .   Anemia of chronic disease Hemoglobin stable-continue to monitor 1/25 Hb 7.4 >7.1 Monitor H&H and transfuse if hemoglobin less than 7 1/25 patient will get 1 unit of PRBC transfusion tomorrow a.m. with hemodialysis    Hyponatremia  - likely due to volume overload - management with HD per renal    Vitamin D deficiency: Vit D level 15, started doxercalciferol  5 mcg IV every MWF, follow with PCP to repeat vitamin D level after 3 to 6 months.    Code Status: Full code DVT Prophylaxis:  SCDs      Level of Care: Level of care: Telemetry Family Communication: Updated patient Disposition Plan:   Pending clearance by nephrologist   Subjective:  Seen and examined at bedside this morning Patient was seen during hemodialysis, left arm swelling has improved but still has significant pain.  Patient denied any other complaints    Physical Exam:   General: Alert and oriented x 3, NAD Cardiovascular: S1 S2 clear, RRR.  Respiratory: Good air  entry bilaterally, mild b/l crackles, no significant wheezes. Gastrointestinal: Soft, nontender, nondistended, NBS Ext: Left arm swelling improved on 1/26, wound VAC intact  Neuro: no new deficits Psych: seems stressed due to Lt arm swelling    Data Reviewed:       Latest Ref Rng & Units 09/25/2024    3:03  AM 09/24/2024    6:27 AM 09/23/2024    6:37 AM  CBC  WBC 4.0 - 10.5 K/uL 8.4  7.9  8.1   Hemoglobin 12.0 - 15.0 g/dL 7.4  7.1  7.4   Hematocrit 36.0 - 46.0 % 24.7  23.7  24.5   Platelets 150 - 400 K/uL 348  331  350        Latest Ref Rng & Units 09/25/2024    3:03 AM 09/24/2024    6:27 AM 09/23/2024    6:37 AM  BMP  Glucose 70 - 99 mg/dL 73  85  74   BUN 6 - 20 mg/dL 52  41  25   Creatinine 0.44 - 1.00 mg/dL 3.76  5.25  6.72   Sodium 135 - 145 mmol/L 128  129  130   Potassium 3.5 - 5.1 mmol/L 5.7  4.8  4.4   Chloride 98 - 111 mmol/L 88  89  90   CO2 22 - 32 mmol/L 22  25  26    Calcium  8.9 - 10.3 mg/dL 9.8  9.8  9.8     Vitals:   09/25/24 1125 09/25/24 1138 09/25/24 1209 09/25/24 1331  BP: (!) 156/99 (!) 139/98 (!) 148/98 (!) 131/94  Pulse: 76 74 64 78  Resp: 19 17 13 17   Temp: 98.7 F (37.1 C)   98.2 F (36.8 C)  TempSrc: Oral     SpO2: 92% 95% 96% 96%  Weight:    (S) 73.9 kg  Height:        Author: Elvan Sor, MD 09/25/2024 3:14 PM  For on call review [email or web address].  "

## 2024-09-25 NOTE — Progress Notes (Signed)
 " [facility] Progress Note   Subjective:   Reports she still feels pain in her chest with deep breaths. Feels SOB when she talks. Denies HA, nausea.   Objective Vitals:   09/24/24 2050 09/25/24 0523 09/25/24 0919 09/25/24 0927  BP: (!) 144/98  (!) 135/96 (!) 135/96  Pulse: 76 79 88 74  Resp: 19 17 20  (!) 21  Temp: 98.3 F (36.8 C) 98.4 F (36.9 C) 98.3 F (36.8 C)   TempSrc: Oral Oral    SpO2:  95% 93% 98%  Weight:   (S) 78.4 kg   Height:       Physical Exam General: Well appearing, NAD. Room air Heart: RRR Lungs: CTAB Abdomen: soft Extremities: 2+ BLE edema Dialysis Access: TDC, L arm bandaged, 1+ hand edema  Additional Objective Labs: Basic Metabolic Panel: Recent Labs  Lab 09/21/24 0353 09/22/24 0603 09/23/24 0637 09/24/24 0627 09/25/24 0303  NA 133* 128* 130* 129* 128*  K 3.9 5.5* 4.4 4.8 5.7*  CL 92* 92* 90* 89* 88*  CO2 24 21* 26 25 22   GLUCOSE 83 79 74 85 73  BUN 42* 38* 25* 41* 52*  CREATININE 4.99* 4.49* 3.27* 4.74* 6.23*  CALCIUM  9.6 9.5 9.8 9.8 9.8  PHOS 3.0 3.5  --  5.8*  --    Liver Function Tests: No results for input(s): AST, ALT, ALKPHOS, BILITOT, PROT, ALBUMIN  in the last 168 hours. No results for input(s): LIPASE, AMYLASE in the last 168 hours. CBC: Recent Labs  Lab 09/21/24 0353 09/22/24 0603 09/23/24 0637 09/24/24 0627 09/25/24 0303  WBC 14.3* 11.6* 8.1 7.9 8.4  HGB 7.9* 8.0* 7.4* 7.1* 7.4*  HCT 26.3* 26.2* 24.5* 23.7* 24.7*  MCV 92.6 93.2 90.1 91.2 90.1  PLT 381 308 350 331 348   Blood Culture    Component Value Date/Time   SDES BLOOD RIGHT HAND 09/16/2024 1326   SPECREQUEST  09/16/2024 1326    BOTTLES DRAWN AEROBIC ONLY Blood Culture results may not be optimal due to an inadequate volume of blood received in culture bottles   CULT  09/16/2024 1326    NO GROWTH 5 DAYS Performed at [facility], [address], [place], [place] [address]    REPTSTATUS 09/21/2024 FINAL 09/16/2024 1326     Cardiac Enzymes: No results for input(s): CKTOTAL, CKMB, CKMBINDEX, TROPONINI in the last 168 hours. CBG: No results for input(s): GLUCAP in the last 168 hours. Iron  Studies: No results for input(s): IRON , TIBC, TRANSFERRIN, FERRITIN in the last 72 hours. @lablastinr3 @ Studies/Results: US  LT UPPER EXTREM LTD SOFT TISSUE NON VASCULAR Result Date: 09/25/2024 EXAM: US  Left Upper Extremity Nonvascular Soft Tissue Ultrasound 09/25/2024 02:45:39 AM TECHNIQUE: Real-time ultrasound scan of the left antecubital fossa with image documentation. COMPARISON: None available. CLINICAL HISTORY: Abscess. Status post brachial basilic arteriovenous fistula ligation, wound dehiscence, overlying dermal necrosis, and wound vacuum-assisted closure placement. FINDINGS: SOFT TISSUES: Limited grayscale and color Doppler sonography in the left antecubital fossa demonstrates mild subcutaneous edema and hyperemia (image 18). No loculated subcutaneous fluid collection is identified. A tubular foreign body is noted (image 9), likely representing a residual arteriovenous graft or chronic thrombosis of an arteriovenous fistula. This is not fully assessed on this examination. IMPRESSION: 1. No loculated subcutaneous fluid collection identified. 2. Tubular foreign body, likely representing a residual arteriovenous graft or chronic thrombosis of an arteriovenous fistula, not fully assessed on this examination. Electronically signed by: Dorethia Molt MD 09/25/2024 03:12 AM EST RP Workstation: [id number]   Medications:  sodium chloride    Intravenous Once   amLODipine   10 mg Oral QHS   carvedilol   25 mg Oral BID WC   Chlorhexidine  Gluconate Cloth  6 each Topical Q0600   cloNIDine   0.1 mg Oral Daily   darbepoetin (ARANESP ) injection - DIALYSIS  200 mcg Subcutaneous Q Sun-1800   doxercalciferol   5 mcg Intravenous Q M,W,F-HD   furosemide   80 mg Oral Daily   heparin  sodium (porcine)  3,800 Units Intracatheter Once    irbesartan   300 mg Oral Daily   pantoprazole   40 mg Oral Daily   sevelamer  carbonate  2.4 g Oral TID WC    Dialysis Orders: MWF - [facility] 3.5hrs, BFR 400, DFR 500,  EDW 68.4kg, 2K/ 2Ca, Heparin  3000 units bolus - Mircera 225 mcg q2wks - last 09/11/24 - Hectorol  5mcg IV qHD - last 09/13/24 - Sensipar  90mg  with HD - last 09/13/24  Assessment/Plan: AVF malfunction - VVS following. Hx multiple revisions in the past, last 1/7. Scheduled ligation aborted 2nd pt c/o CP. S/p ligation 1/16. S/p I&D with wound vac placement 1/20. On Vanc/Cefepime .  Chest Pain - Troponins elevated initially, cardiology felt not anginal. CTA with volume overload.  ESRD -  on HD MWF. Remains overload on exam and imaging. Continue lowering weights, goal 4.5L today Hypertension/volume  - BP meds resumed, UF as tolerated with HD as above Anemia of CKD - Hgb 7.4, on high dose ESA Secondary Hyperparathyroidism -  Ca ok, Phos high - restarted binders (sevelamer ) Nutrition - Renal diet with fluid restriction GERD - Had been getting sucralfate , stopped. Continue PPI  Lucie Collet, PA-C 09/25/2024, 9:40 AM  [facility] Pager: [phone]   "

## 2024-09-25 NOTE — Plan of Care (Signed)
" °  Problem: Education: Goal: Knowledge of General Education information will improve Description: Including pain rating scale, medication(s)/side effects and non-pharmacologic comfort measures Outcome: Progressing   Problem: Health Behavior/Discharge Planning: Goal: Ability to manage health-related needs will improve Outcome: Progressing   Problem: Clinical Measurements: Goal: Ability to maintain clinical measurements within normal limits will improve Outcome: Progressing Goal: Will remain free from infection Outcome: Progressing Goal: Diagnostic test results will improve Outcome: Progressing Goal: Respiratory complications will improve Outcome: Progressing Goal: Cardiovascular complication will be avoided Outcome: Progressing   Problem: Activity: Goal: Risk for activity intolerance will decrease Outcome: Progressing   Problem: Nutrition: Goal: Adequate nutrition will be maintained Outcome: Progressing   Problem: Coping: Goal: Level of anxiety will decrease Outcome: Progressing   Problem: Elimination: Goal: Will not experience complications related to bowel motility Outcome: Progressing Goal: Will not experience complications related to urinary retention Outcome: Progressing   Problem: Pain Managment: Goal: General experience of comfort will improve and/or be controlled Outcome: Progressing   Problem: Safety: Goal: Ability to remain free from injury will improve Outcome: Progressing   Problem: Skin Integrity: Goal: Risk for impaired skin integrity will decrease Outcome: Progressing   Problem: Education: Goal: Knowledge of the prescribed therapeutic regimen will improve Outcome: Progressing   Problem: Bowel/Gastric: Goal: Gastrointestinal status for postoperative course will improve Outcome: Progressing   Problem: Cardiac: Goal: Ability to maintain an adequate cardiac output Outcome: Progressing Goal: Will show no evidence of cardiac arrhythmias Outcome:  Progressing   Problem: Nutritional: Goal: Will attain and maintain optimal nutritional status Outcome: Progressing   Problem: Neurological: Goal: Will regain or maintain usual level of consciousness Outcome: Progressing   Problem: Clinical Measurements: Goal: Ability to maintain clinical measurements within normal limits Outcome: Progressing Goal: Postoperative complications will be avoided or minimized Outcome: Progressing   Problem: Respiratory: Goal: Will regain and/or maintain adequate ventilation Outcome: Progressing Goal: Respiratory status will improve Outcome: Progressing   Problem: Skin Integrity: Goal: Demonstrates signs of wound healing without infection Outcome: Progressing   Problem: Urinary Elimination: Goal: Will remain free from infection Outcome: Progressing Goal: Ability to achieve and maintain adequate urine output Outcome: Progressing   "

## 2024-09-25 NOTE — TOC Progression Note (Addendum)
 Transition of Care [facility]) - Progression Note    Patient Details  Name: Linda [name] MRN: [id number] Date of Birth: 1995-09-17  Transition of Care [facility]) CM/SW Contact  Tom-Johnson, Tieasha Larsen Daphne, RN Phone Number: 09/25/2024, 2:59 PM  Clinical Narrative:     Patient accepted in the Hub for home health RN by [facility], info on AVS. CM spoke with Artavia and informed her of Wound Vac change days. Patient is scheduled for Wound Vac change tomorrow 09/26/24, has Tues/Fri schedule. Patient with Lt Arm Edema, Vascular following. Nephrology following for iHD.  CM will retrieve KCI/75M Wound vac from 6N when patient is Medically ready for discharge.   CM will continue to follow as patient progresses with care towards discharge.                    Expected Discharge Plan and Services                                   HH Arranged: RN (Wound Vac Exchange) HH Agency: [facility] ([facility]) Date HH Agency Contacted: 09/23/24 Time HH Agency Contacted: 1015 Representative spoke with at [facility] Agency: [facility] via [employer] Drivers of Health (SDOH) Interventions SDOH Screenings   Food Insecurity: No Food Insecurity (09/14/2024)  Housing: Low Risk (09/14/2024)  Transportation Needs: No Transportation Needs (09/14/2024)  Utilities: Not At Risk (09/14/2024)  Alcohol Screen: Low Risk (02/03/2024)  Depression (PHQ2-9): Low Risk (02/03/2024)  Recent Concern: Depression (PHQ2-9) - Medium Risk (01/26/2024)  Financial Resource Strain: Low Risk (02/03/2024)  Physical Activity: Insufficiently Active (02/03/2024)  Social Connections: Moderately Isolated (09/14/2024)  Stress: No Stress Concern Present (02/03/2024)  Tobacco Use: High Risk (09/19/2024)  Health Literacy: Adequate Health Literacy (02/03/2024)    Readmission Risk Interventions    09/15/2024    2:40 PM 11/15/2023    2:53 PM 10/25/2023    3:32 PM  Readmission Risk Prevention Plan  Transportation Screening Complete Complete  Complete  HRI or Home Care Consult   Complete  Social Work Consult for Recovery Care Planning/Counseling   Complete  Palliative Care Screening   Complete  Medication Review [employer]) Referral to Pharmacy Referral to Pharmacy Referral to Pharmacy  PCP or Specialist appointment within 3-5 days of discharge Complete Complete   HRI or Home Care Consult Complete Complete   SW Recovery Care/Counseling Consult Complete Complete   Palliative Care Screening Not Applicable Not Applicable   Skilled Nursing Facility Not Applicable Not Applicable

## 2024-09-25 NOTE — Progress Notes (Signed)
" °   09/25/24 1331  Vitals  Temp 98.2 F (36.8 C)  Pulse Rate 78  Resp 17  BP (!) 131/94  SpO2 96 %  O2 Device Room Air  Weight (S)  73.9 kg (Standing Scale)  Type of Weight Post-Dialysis  Oxygen Therapy  Patient Activity (if Appropriate) In bed  Pulse Oximetry Type Continuous  Post Treatment  Dialyzer Clearance Clear  Hemodialysis Intake (mL) 0 mL  Liters Processed 84  Fluid Removed (mL) 4500 mL  Tolerated HD Treatment Yes  Post-Hemodialysis Comments Tx. Completed without difficulties. UF goal  obtained, Admin medication per. order. Report call to 71M 17 Bedside Nurse.   Received patient in bed to unit.  Alert and oriented.  Informed consent signed and in chart.   TX duration: 3.5  Patient tolerated well.  Transported back to the room  Alert, without acute distress.  Hand-off given to patient's nurse.   Access used: Yes Access issues: No  Total UF removed: 4500 Medication(s) given: See MAR Post HD VS: See Above Grid Post HD weight: 73.9 kg   Zebedee DELENA [facility] "

## 2024-09-25 NOTE — Progress Notes (Signed)
 Left upper extremity venous duplex has been completed.  Results can be found in chart review under CV Proc.  09/25/2024 6:10 PM  Emanie Behan Elden [employer], RVT.

## 2024-09-26 ENCOUNTER — Inpatient Hospital Stay (HOSPITAL_COMMUNITY)

## 2024-09-26 DIAGNOSIS — T82898A Other specified complication of vascular prosthetic devices, implants and grafts, initial encounter: Secondary | ICD-10-CM | POA: Diagnosis not present

## 2024-09-26 LAB — CBC
HCT: 26.3 % — ABNORMAL LOW (ref 36.0–46.0)
Hemoglobin: 8.1 g/dL — ABNORMAL LOW (ref 12.0–15.0)
MCH: 27.2 pg (ref 26.0–34.0)
MCHC: 30.8 g/dL (ref 30.0–36.0)
MCV: 88.3 fL (ref 80.0–100.0)
Platelets: 348 10*3/uL (ref 150–400)
RBC: 2.98 MIL/uL — ABNORMAL LOW (ref 3.87–5.11)
RDW: 22 % — ABNORMAL HIGH (ref 11.5–15.5)
WBC: 11.4 10*3/uL — ABNORMAL HIGH (ref 4.0–10.5)
nRBC: 0 % (ref 0.0–0.2)

## 2024-09-26 LAB — BASIC METABOLIC PANEL WITH GFR
Anion gap: 17 — ABNORMAL HIGH (ref 5–15)
BUN: 38 mg/dL — ABNORMAL HIGH (ref 6–20)
CO2: 24 mmol/L (ref 22–32)
Calcium: 10.1 mg/dL (ref 8.9–10.3)
Chloride: 89 mmol/L — ABNORMAL LOW (ref 98–111)
Creatinine, Ser: 5.05 mg/dL — ABNORMAL HIGH (ref 0.44–1.00)
GFR, Estimated: 11 mL/min — ABNORMAL LOW
Glucose, Bld: 91 mg/dL (ref 70–99)
Potassium: 5.4 mmol/L — ABNORMAL HIGH (ref 3.5–5.1)
Sodium: 129 mmol/L — ABNORMAL LOW (ref 135–145)

## 2024-09-26 MED ORDER — CHLORHEXIDINE GLUCONATE CLOTH 2 % EX PADS
6.0000 | MEDICATED_PAD | Freq: Every day | CUTANEOUS | Status: AC
  Administered 2024-09-26 – 2024-10-06 (×7): 6 via TOPICAL

## 2024-09-26 MED ORDER — SODIUM ZIRCONIUM CYCLOSILICATE 10 G PO PACK
10.0000 g | PACK | Freq: Once | ORAL | Status: AC
  Administered 2024-09-26: 10 g via ORAL
  Filled 2024-09-26: qty 1

## 2024-09-26 MED ORDER — PREDNISONE 20 MG PO TABS
40.0000 mg | ORAL_TABLET | Freq: Every day | ORAL | Status: AC
  Administered 2024-09-26 – 2024-09-28 (×3): 40 mg via ORAL
  Filled 2024-09-26 (×3): qty 2

## 2024-09-26 NOTE — Consult Note (Signed)
" °  WOC Nurse Consult Note: Reason for Consult: Vac change performed.  Pt was medicated for pain prior to the procedure but was very anxious and crying and the procedure took 45 min to perform.  Allowed patient to assist with removing dressing slowly to allow her to feel more in control.  Left arm full thickness wound red/yellow, small amt yellow drainage. Exposed sutures to inner wound. Mod edema surrounding the wound.     Applied one piece black foam to wound at cont suction. Applied ace wrap.  WOC team will perform dressing change again on Fri if patient is still in the hospital at that time.  Thank-you,  Stephane Fought MSN, RN, CWOCN, CWCN-AP, CNS Contact Mon-Fri 0700-1500: [phone]     "

## 2024-09-26 NOTE — Progress Notes (Signed)
 " [facility] Progress Note   Subjective:   Reports she is still having shortness of breath and chest pains with deep breaths. Had 4.5L UF yesterday and reports it did not help her breathing at all. Denies HA, palpitations, dizziness. Says her whole body is achy.   Objective Vitals:   09/25/24 1745 09/25/24 1942 09/26/24 0540 09/26/24 0933  BP: (!) 147/110 (!) 133/92 (!) 119/94 (!) 127/102  Pulse: 91 91 89 87  Resp: 18 18 17 17   Temp: 99.2 F (37.3 C) 98.7 F (37.1 C) 99.8 F (37.7 C) 98.3 F (36.8 C)  TempSrc: Oral Oral Oral Oral  SpO2: 91% 93% 99% 95%  Weight:      Height:       Physical Exam General: Well appearing, NAD. Room air Heart: RRR Lungs: CTAB, respirations unlabored on RA Abdomen: soft Extremities: 1+ BLE edema Dialysis Access: TDC, L arm bandaged, trace hand edema  Additional Objective Labs: Basic Metabolic Panel: Recent Labs  Lab 09/21/24 0353 09/22/24 0603 09/23/24 0637 09/24/24 0627 09/25/24 0303  NA 133* 128* 130* 129* 128*  K 3.9 5.5* 4.4 4.8 5.7*  CL 92* 92* 90* 89* 88*  CO2 24 21* 26 25 22   GLUCOSE 83 79 74 85 73  BUN 42* 38* 25* 41* 52*  CREATININE 4.99* 4.49* 3.27* 4.74* 6.23*  CALCIUM  9.6 9.5 9.8 9.8 9.8  PHOS 3.0 3.5  --  5.8*  --    Liver Function Tests: No results for input(s): AST, ALT, ALKPHOS, BILITOT, PROT, ALBUMIN  in the last 168 hours. No results for input(s): LIPASE, AMYLASE in the last 168 hours. CBC: Recent Labs  Lab 09/21/24 0353 09/22/24 0603 09/23/24 0637 09/24/24 0627 09/25/24 0303  WBC 14.3* 11.6* 8.1 7.9 8.4  HGB 7.9* 8.0* 7.4* 7.1* 7.4*  HCT 26.3* 26.2* 24.5* 23.7* 24.7*  MCV 92.6 93.2 90.1 91.2 90.1  PLT 381 308 350 331 348   Blood Culture    Component Value Date/Time   SDES BLOOD RIGHT HAND 09/16/2024 1326   SPECREQUEST  09/16/2024 1326    BOTTLES DRAWN AEROBIC ONLY Blood Culture results may not be optimal due to an inadequate volume of blood received in culture bottles    CULT  09/16/2024 1326    NO GROWTH 5 DAYS Performed at [facility], [address], [place], [place] [address]    REPTSTATUS 09/21/2024 FINAL 09/16/2024 1326    Cardiac Enzymes: No results for input(s): CKTOTAL, CKMB, CKMBINDEX, TROPONINI in the last 168 hours. CBG: No results for input(s): GLUCAP in the last 168 hours. Iron  Studies: No results for input(s): IRON , TIBC, TRANSFERRIN, FERRITIN in the last 72 hours. @lablastinr3 @ Studies/Results: VAS US  UPPER EXTREMITY VENOUS DUPLEX Result Date: 09/26/2024 UPPER VENOUS STUDY  Patient Name:  Linda [name]  Date of Exam:   09/25/2024 Medical Rec #: [id number]              Accession #:    [id number] Date of Birth: 03-12-1996              Patient Gender: F Patient Age:   29 years Exam Location:  [facility] Procedure:      VAS US  UPPER EXTREMITY VENOUS DUPLEX Referring Phys: [facility] --------------------------------------------------------------------------------  Indications: Edema, rule out DVT, and Post-op Comparison Study: Previous study on 1.14.2026. Performing Technologist: Edilia Elden Appl  Examination Guidelines: A complete evaluation includes B-mode imaging, spectral Doppler, color Doppler, and power Doppler as needed of all accessible portions of each  vessel. Bilateral testing is considered an integral part of a complete examination. Limited examinations for reoccurring indications may be performed as noted.  Right Findings: +----------+------------+---------+-----------+----------+-------+ RIGHT     CompressiblePhasicitySpontaneousPropertiesSummary +----------+------------+---------+-----------+----------+-------+ IJV           Full       No        Yes                      +----------+------------+---------+-----------+----------+-------+ Subclavian    Full       Yes       Yes                      +----------+------------+---------+-----------+----------+-------+  Left Findings:  +----------+------------+---------+-----------+----------+-------+ LEFT      CompressiblePhasicitySpontaneousPropertiesSummary +----------+------------+---------+-----------+----------+-------+ IJV           Full       Yes       Yes                      +----------+------------+---------+-----------+----------+-------+ Subclavian    Full       Yes       Yes                      +----------+------------+---------+-----------+----------+-------+ Axillary      Full       Yes       Yes                      +----------+------------+---------+-----------+----------+-------+ Brachial      Full       Yes       Yes                      +----------+------------+---------+-----------+----------+-------+ Radial        Full                                          +----------+------------+---------+-----------+----------+-------+ Ulnar         Full                                          +----------+------------+---------+-----------+----------+-------+ Cephalic      Full       Yes       Yes                      +----------+------------+---------+-----------+----------+-------+ Basilic       Full       Yes       Yes                      +----------+------------+---------+-----------+----------+-------+ Unable to properly compress near the antecubital fossa due to bandages, wound vac and patient's post-op pain. No flow detected in graft or AVF.  Summary:  Right: No evidence of thrombosis in the subclavian.  Left: No evidence of deep vein thrombosis in the upper extremity. No evidence of superficial vein thrombosis in the upper extremity.  *See table(s) above for measurements and observations.  Diagnosing physician: Fonda Rim Electronically signed by Fonda Rim on 09/26/2024 at 5:18:26 AM.    Final    US  LT UPPER EXTREM LTD SOFT TISSUE NON VASCULAR Result Date: 09/25/2024  EXAM: US  Left Upper Extremity Nonvascular Soft Tissue Ultrasound 09/25/2024 02:45:39 AM  TECHNIQUE: Real-time ultrasound scan of the left antecubital fossa with image documentation. COMPARISON: None available. CLINICAL HISTORY: Abscess. Status post brachial basilic arteriovenous fistula ligation, wound dehiscence, overlying dermal necrosis, and wound vacuum-assisted closure placement. FINDINGS: SOFT TISSUES: Limited grayscale and color Doppler sonography in the left antecubital fossa demonstrates mild subcutaneous edema and hyperemia (image 18). No loculated subcutaneous fluid collection is identified. A tubular foreign body is noted (image 9), likely representing a residual arteriovenous graft or chronic thrombosis of an arteriovenous fistula. This is not fully assessed on this examination. IMPRESSION: 1. No loculated subcutaneous fluid collection identified. 2. Tubular foreign body, likely representing a residual arteriovenous graft or chronic thrombosis of an arteriovenous fistula, not fully assessed on this examination. Electronically signed by: Dorethia Molt MD 09/25/2024 03:12 AM EST RP Workstation: [id number]   Medications:   amLODipine   10 mg Oral QHS   carvedilol   25 mg Oral BID WC   Chlorhexidine  Gluconate Cloth  6 each Topical Q0600   cloNIDine   0.1 mg Oral Daily   darbepoetin (ARANESP ) injection - DIALYSIS  200 mcg Subcutaneous Q Sun-1800   doxercalciferol   5 mcg Intravenous Q M,W,F-HD   furosemide   80 mg Oral Daily   irbesartan   300 mg Oral Daily   pantoprazole   40 mg Oral Daily   sevelamer  carbonate  2.4 g Oral TID WC    Dialysis Orders: MWF - [facility] 3.5hrs, BFR 400, DFR 500,  EDW 68.4kg, 2K/ 2Ca, Heparin  3000 units bolus - Mircera 225 mcg q2wks - last 09/11/24 - Hectorol  5mcg IV qHD - last 09/13/24 - Sensipar  90mg  with HD - last 09/13/24  Assessment/Plan: AVF malfunction - VVS following. Hx multiple revisions in the past, last 1/7. Scheduled ligation aborted 2nd pt c/o CP. S/p ligation 1/16. S/p I&D with wound vac placement 1/20. On Vanc/Cefepime .   Chest Pain - Troponins elevated initially, cardiology felt not anginal. CTA with volume overload on 1/17.  ESRD -  on HD MWF. Remains volume overload but improved. Will recheck CXR as dialysis does not seem to be helping her symptoms. Continue max UF with HD.  Hypertension/volume  - BP meds resumed, UF as tolerated with HD as above Anemia of CKD - Hgb 7.4, on high dose ESA Secondary Hyperparathyroidism -  Ca ok, Phos high - restarted binders (sevelamer ) Nutrition - Renal diet with fluid restriction GERD - Had been getting sucralfate , stopped. Continue PPI  Lucie Collet, PA-C 09/26/2024, 9:44 AM  [facility] Pager: [phone]   "

## 2024-09-26 NOTE — Progress Notes (Signed)
 " Progress Note   Patient: Linda [name] FMW:[id number] DOB: Nov 24, 1995 DOA: 09/13/2024     12 DOS: the patient was seen and examined on 09/26/2024      Brief hospital course: Patient is a 29 year old female with hypertension, G6PD deficiency, CHF, lupus, ESRD on HD, MWF schedule, history of multiple AV fistula revisions presented with progressively worsening left arm swelling and pain since after recent AV fistula surgery on 09/06/2024 (Dr Serene).  She noted wound dehiscence about 3 days ago prior to admission.  No fever or chills, nausea vomiting or abdominal pain.  Patient is receiving dialysis via right IJ TDC. Vascular surgery was consulted.     Assessment and Plan:  # Left upper extremity edema and pain with ongoing drainage from incision after recent AV fistula surgery - Vascular surgery following, underwent left arm placed acute basilic fistula ligation, incision and debridement with wound VAC placement on 09/15/2024 Vascular surgery on board Vascular surgeon took patient for left arm AV fistula washout with wound VAC placement. Wound VAC was changed by wound care on Friday 1/23 Vascular surgery planning new access as an outpatient 1/25 c/o Left arm swelling and pain.  Advised to keep it elevated and we [name] continue as needed pain meds.  Seen by nephrology and trying to get in touch with vascular surgery Left arm US : 1. No loculated subcutaneous fluid collection identified. 2. Tubular foreign body, likely representing a residual arteriovenous graft or chronic thrombosis of an arteriovenous fistula, not fully assessed on this examination. 1/26 edema improved, still has significant pain, patient was seen by vascular surgery 1/27 wound VAC was replaced today.  Next wound VAC [name] be replaced on Friday    # Chest pain with mildly elevated troponin - Last 2D echo 03/06/2024 showed EF of 30 to 35% with global hypokinesis, normal RV SF - Seen by cardiology, chest pain not anginal,  troponins flat - CXR 1/17 showed increasing dense left lower lobe consolidation.  S/p IV vancomycin , cefepime  completed x 7 days course, d/c'd on 1/24 - CTA chest did not show acute PE.  1/27 started prednisone  40 mg p.o. daily for 3 days due to musculoskeletal tenderness.    ESRD on HD MWF Hyperkalemia: -Currently receiving dialysis treatments via right IJ [facility] Continue hemodialysis as recommended by nephrology 1/27 potassium 5.4, Lokelma  10 g x 1 dose given Next hemodialysis tomorrow a.m.   Acute on Chronic systolic and diastolic HFrEF with volume overload --2D echo showed EF 40 to 45%, global hypokinesis, severe LVH, G2 DD, moderate pericardial effusion, no tamponade - Continue HD for volume management  # Pericardial effusion 09/07/24 TTE shows moderate pericardial effusion 1/27 CXR shows increased cardiac silhouette possible increased pericardial effusion. 1/27 follow repeat limited 2D echocardiogram for pericardial effusion.   Hypertension Continue current antihypertensives including amlodipine , carvedilol , clonidine  and Lasix     GERD Continue PPI and sucralfate .   Anemia of chronic disease Hemoglobin stable-continue to monitor 1/25 Hb 7.4 >7.1> prbc 1 u >>8.1 Monitor H&H and transfuse if hemoglobin less than 7 1/26 s/p 1 unit of PRBC transfusion with hemodialysis    Hyponatremia  - likely due to volume overload - management with HD per renal    Vitamin D deficiency: Vit D level 15, started doxercalciferol  5 mcg IV every MWF, follow with PCP to repeat vitamin D level after 3 to 6 months.    Code Status: Full code DVT Prophylaxis:  SCDs      Level of Care: Level of care: Telemetry  Family Communication: Updated patient Disposition Plan:   Pending clearance by nephrologist   Subjective:  Seen and examined at bedside this morning Patient still has significant pain in the left arm, it got worse after wound VAC change.  Swelling is improved Patient is complaining of  bilateral chest wall tenderness and pain. Agree with trial of prednisone  Patient does not feel comfortable going home today, would like to stay for hemodialysis tomorrow a.m.    Physical Exam:   General: Alert and oriented x 3, NAD Cardiovascular: S1 S2 clear, RRR.  Respiratory: Good air entry bilaterally, mild b/l crackles, no significant wheezes.  Bilateral chest wall tenderness  Gastrointestinal: Soft, nontender, nondistended, NBS Ext: Left arm swelling improved on 1/26, wound VAC replaced on 1/27 Neuro: no new deficits Psych: seems stressed due to Lt arm pain     Data Reviewed:       Latest Ref Rng & Units 09/26/2024   11:30 AM 09/25/2024    3:03 AM 09/24/2024    6:27 AM  CBC  WBC 4.0 - 10.5 K/uL 11.4  8.4  7.9   Hemoglobin 12.0 - 15.0 g/dL 8.1  7.4  7.1   Hematocrit 36.0 - 46.0 % 26.3  24.7  23.7   Platelets 150 - 400 K/uL 348  348  331        Latest Ref Rng & Units 09/26/2024   11:30 AM 09/25/2024    3:03 AM 09/24/2024    6:27 AM  BMP  Glucose 70 - 99 mg/dL 91  73  85   BUN 6 - 20 mg/dL 38  52  41   Creatinine 0.44 - 1.00 mg/dL 4.94  3.76  5.25   Sodium 135 - 145 mmol/L 129  128  129   Potassium 3.5 - 5.1 mmol/L 5.4  5.7  4.8   Chloride 98 - 111 mmol/L 89  88  89   CO2 22 - 32 mmol/L 24  22  25    Calcium  8.9 - 10.3 mg/dL 89.8  9.8  9.8     Vitals:   09/25/24 1745 09/25/24 1942 09/26/24 0540 09/26/24 0933  BP: (!) 147/110 (!) 133/92 (!) 119/94 (!) 127/102  Pulse: 91 91 89 87  Resp: 18 18 17 17   Temp: 99.2 F (37.3 C) 98.7 F (37.1 C) 99.8 F (37.7 C) 98.3 F (36.8 C)  TempSrc: Oral Oral Oral Oral  SpO2: 91% 93% 99% 95%  Weight:      Height:        Author: Elvan Sor, MD 09/26/2024 3:57 PM  For on call review [email or web address].  "

## 2024-09-27 ENCOUNTER — Inpatient Hospital Stay (HOSPITAL_COMMUNITY)

## 2024-09-27 DIAGNOSIS — I10 Essential (primary) hypertension: Secondary | ICD-10-CM | POA: Diagnosis not present

## 2024-09-27 DIAGNOSIS — I3139 Other pericardial effusion (noninflammatory): Secondary | ICD-10-CM | POA: Diagnosis not present

## 2024-09-27 DIAGNOSIS — I5020 Unspecified systolic (congestive) heart failure: Secondary | ICD-10-CM | POA: Diagnosis not present

## 2024-09-27 DIAGNOSIS — N186 End stage renal disease: Secondary | ICD-10-CM | POA: Diagnosis not present

## 2024-09-27 DIAGNOSIS — T82898A Other specified complication of vascular prosthetic devices, implants and grafts, initial encounter: Secondary | ICD-10-CM | POA: Diagnosis not present

## 2024-09-27 DIAGNOSIS — I309 Acute pericarditis, unspecified: Secondary | ICD-10-CM | POA: Diagnosis not present

## 2024-09-27 LAB — ECHOCARDIOGRAM LIMITED
Area-P 1/2: 3.37 cm2
Height: 66 in
S' Lateral: 3.5 cm
Weight: 2462.1 [oz_av]

## 2024-09-27 LAB — CBC
HCT: 23.6 % — ABNORMAL LOW (ref 36.0–46.0)
Hemoglobin: 7.5 g/dL — ABNORMAL LOW (ref 12.0–15.0)
MCH: 27.3 pg (ref 26.0–34.0)
MCHC: 31.8 g/dL (ref 30.0–36.0)
MCV: 85.8 fL (ref 80.0–100.0)
Platelets: 315 10*3/uL (ref 150–400)
RBC: 2.75 MIL/uL — ABNORMAL LOW (ref 3.87–5.11)
RDW: 21.7 % — ABNORMAL HIGH (ref 11.5–15.5)
WBC: 17.8 10*3/uL — ABNORMAL HIGH (ref 4.0–10.5)
nRBC: 0 % (ref 0.0–0.2)

## 2024-09-27 LAB — RENAL FUNCTION PANEL
Albumin: 3.2 g/dL — ABNORMAL LOW (ref 3.5–5.0)
Anion gap: 17 — ABNORMAL HIGH (ref 5–15)
BUN: 48 mg/dL — ABNORMAL HIGH (ref 6–20)
CO2: 25 mmol/L (ref 22–32)
Calcium: 10.4 mg/dL — ABNORMAL HIGH (ref 8.9–10.3)
Chloride: 91 mmol/L — ABNORMAL LOW (ref 98–111)
Creatinine, Ser: 6.09 mg/dL — ABNORMAL HIGH (ref 0.44–1.00)
GFR, Estimated: 9 mL/min — ABNORMAL LOW
Glucose, Bld: 116 mg/dL — ABNORMAL HIGH (ref 70–99)
Phosphorus: 7.3 mg/dL — ABNORMAL HIGH (ref 2.5–4.6)
Potassium: 5.4 mmol/L — ABNORMAL HIGH (ref 3.5–5.1)
Sodium: 132 mmol/L — ABNORMAL LOW (ref 135–145)

## 2024-09-27 MED ORDER — HYDROMORPHONE HCL 1 MG/ML IJ SOLN
INTRAMUSCULAR | Status: AC
  Filled 2024-09-27: qty 1

## 2024-09-27 MED ORDER — HEPARIN SODIUM (PORCINE) 1000 UNIT/ML IJ SOLN
INTRAMUSCULAR | Status: AC
  Filled 2024-09-27: qty 4

## 2024-09-27 MED ORDER — METHOCARBAMOL 500 MG PO TABS
500.0000 mg | ORAL_TABLET | Freq: Three times a day (TID) | ORAL | Status: AC | PRN
  Administered 2024-10-01 – 2024-10-02 (×2): 500 mg via ORAL
  Filled 2024-09-27 (×8): qty 1

## 2024-09-27 NOTE — Progress Notes (Signed)
 "  PROGRESS NOTE    Linda [name]  FMW:[id number] DOB: 09-06-1995 DOA: 09/13/2024 PCP: [name], NP   Brief Narrative: Linda [name] is a 29 y.o. female with a history of hypertension, G6PD deficiency, CHF, lupus, ESRD on HD.  Patient presented secondary to left arm swelling in setting of recent AV fistula surgery and recent wound dehiscence. Vascular surgery consulted and performed AV fistula washout with wound vac placement.   Assessment and Plan:  Left upper extremity edema In setting of recent AV fistula surgery. Resultant dehiscence of wound. Vascular surgery consulted and performed a left arm brachiobasilic fistula ligation with I&D and placement of a wound vac. No growth on wound culture. - Vascular surgery recommendations: pending  Chest pain Patient with troponin of 118 with negative delta of 112. Echo with slightly improved LVEF of 40-45% with global hypokinesis. Cardiology was consulted for workup/management. Symptoms appeared to improved with ongoing treatment for volume overload. Prednisone  started for concern for musculoskeletal component. Patient started on prednisone  for likely musculoskeletal etiology and reports improvement. - Complete prednisone  burst  ESRD on HD MWF schedule. Nephrology consulted for ongoing hemodialysis needs.  Hyperkalemia Patient given Lokelma . Continued management per nephrology with hemodialysis.  Acute on chronic HFrEF Managed via hemodialysis.  Pericardial effusion Noted on Transthoracic Echocardiogram. No clinical evidence of tamponade. Repeat limited echo performed today and is pending. - Follow-up limited echo results  GERD - Continue Protonix  and sucralfate   Primary hypertension - Continue amlodipine , carvedilol , clonidine  and Lasix   Hyponatremia Secondary to volume overload. Management per HD.  Vitamin D deficiency Patient started on doxercalciferol  with HD.   DVT prophylaxis: SCDs Code Status:    Code Status: Full Code Family Communication: None at bedside Disposition Plan: Discharge pending ongoing vascular surgery recommendations   Consultants:  Vascular surgery Cardiology  Procedures:    Antimicrobials:     Subjective: Patient reports improved chest pain. No other concerns.  Objective: BP (!) 143/114 (BP Location: Right Wrist)   Pulse 68   Temp 98.4 F (36.9 C)   Resp (!) 6   Ht 5' 6 (1.676 m)   Wt 74.3 kg Comment: standing scale  LMP  (LMP Unknown)   SpO2 93%   BMI 26.44 kg/m   Examination:  General exam: Appears calm and comfortable. Respiratory system: Clear to auscultation. Respiratory effort normal. Cardiovascular system: S1 & S2 heard, RRR.  Gastrointestinal system: Abdomen is nondistended, soft and nontender. Normal bowel sounds heard. Central nervous system: Alert and oriented. No focal neurological deficits. Musculoskeletal: BLE edema. No calf tenderness Psychiatry: Judgement and insight appear normal. Mood & affect appropriate.    Data Reviewed: I have personally reviewed following labs and imaging studies  CBC Lab Results  Component Value Date   WBC 17.8 (H) 09/27/2024   RBC 2.75 (L) 09/27/2024   HGB 7.5 (L) 09/27/2024   HCT 23.6 (L) 09/27/2024   MCV 85.8 09/27/2024   MCH 27.3 09/27/2024   PLT 315 09/27/2024   MCHC 31.8 09/27/2024   RDW 21.7 (H) 09/27/2024   LYMPHSABS 2.0 09/13/2024   MONOABS 1.2 (H) 09/13/2024   EOSABS 0.1 09/13/2024   BASOSABS 0.0 09/13/2024     Last metabolic panel Lab Results  Component Value Date   NA 129 (L) 09/26/2024   K 5.4 (H) 09/26/2024   CL 89 (L) 09/26/2024   CO2 24 09/26/2024   BUN 38 (H) 09/26/2024   CREATININE 5.05 (H) 09/26/2024   GLUCOSE 91 09/26/2024   GFRNONAA 11 (L)  09/26/2024   GFRAA 19 (L) 03/27/2019   CALCIUM  10.1 09/26/2024   PHOS 5.8 (H) 09/24/2024   PROT 7.9 09/13/2024   ALBUMIN  3.3 09/13/2024   BILITOT 1.1 09/13/2024   ALKPHOS 174 09/13/2024   AST 26 09/13/2024    ALT 7 09/13/2024   ANIONGAP 17 (H) 09/26/2024    GFR: Estimated Creatinine Clearance: 16.9 mL/min (A) (by C-G formula based on SCr of 5.05 mg/dL (H)).  No results found for this or any previous visit (from the past 240 hours).    Radiology Studies: DG CHEST PORT 1 VIEW Result Date: 09/26/2024 CLINICAL DATA:  Shortness of breath. EXAM: PORTABLE CHEST 1 VIEW COMPARISON:  Multiple chest radiographs with the most recent dated 09/16/2024 FINDINGS: Stable positioning of right-sided tunneled dialysis catheter. Massive enlargement of the cardiac silhouette again noted. This appears increased since recent chest radiographs, especially since 09/08/2024. Findings are suspicious for enlargement of the underlying known pericardial effusion. No evidence associated pulmonary edema or pneumothorax. There may be a pleural effusion on the left and associated left basilar atelectasis. IMPRESSION: 1. Massive enlargement of the cardiac silhouette appears increased since recent chest radiographs, especially since 09/08/2024. Findings are suspicious for enlargement of the underlying known pericardial effusion. 2. Possible left pleural effusion and left basilar atelectasis. Electronically Signed   By: Marcey Moan M.D.   On: 09/26/2024 15:30   VAS US  UPPER EXTREMITY VENOUS DUPLEX Result Date: 09/26/2024 UPPER VENOUS STUDY  Patient Name:  Linda [name]  Date of Exam:   09/25/2024 Medical Rec #: [id number]              Accession #:    [id number] Date of Birth: Oct 31, 1995              Patient Gender: F Patient Age:   52 years Exam Location:  [facility] Procedure:      VAS US  UPPER EXTREMITY VENOUS DUPLEX Referring Phys: [facility] --------------------------------------------------------------------------------  Indications: Edema, rule out DVT, and Post-op Comparison Study: Previous study on 1.14.2026. Performing Technologist: Edilia Elden Appl  Examination Guidelines: A complete evaluation includes B-mode  imaging, spectral Doppler, color Doppler, and power Doppler as needed of all accessible portions of each vessel. Bilateral testing is considered an integral part of a complete examination. Limited examinations for reoccurring indications may be performed as noted.  Right Findings: +----------+------------+---------+-----------+----------+-------+ RIGHT     CompressiblePhasicitySpontaneousPropertiesSummary +----------+------------+---------+-----------+----------+-------+ IJV           Full       No        Yes                      +----------+------------+---------+-----------+----------+-------+ Subclavian    Full       Yes       Yes                      +----------+------------+---------+-----------+----------+-------+  Left Findings: +----------+------------+---------+-----------+----------+-------+ LEFT      CompressiblePhasicitySpontaneousPropertiesSummary +----------+------------+---------+-----------+----------+-------+ IJV           Full       Yes       Yes                      +----------+------------+---------+-----------+----------+-------+ Subclavian    Full       Yes       Yes                      +----------+------------+---------+-----------+----------+-------+  Axillary      Full       Yes       Yes                      +----------+------------+---------+-----------+----------+-------+ Brachial      Full       Yes       Yes                      +----------+------------+---------+-----------+----------+-------+ Radial        Full                                          +----------+------------+---------+-----------+----------+-------+ Ulnar         Full                                          +----------+------------+---------+-----------+----------+-------+ Cephalic      Full       Yes       Yes                      +----------+------------+---------+-----------+----------+-------+ Basilic       Full       Yes       Yes                       +----------+------------+---------+-----------+----------+-------+ Unable to properly compress near the antecubital fossa due to bandages, wound vac and patient's post-op pain. No flow detected in graft or AVF.  Summary:  Right: No evidence of thrombosis in the subclavian.  Left: No evidence of deep vein thrombosis in the upper extremity. No evidence of superficial vein thrombosis in the upper extremity.  *See table(s) above for measurements and observations.  Diagnosing physician: Fonda Rim Electronically signed by Fonda Rim on 09/26/2024 at 5:18:26 AM.    Final       LOS: 13 days    Elgin Lam, MD [facility] 09/27/2024, 9:17 AM   If 7PM-7AM, please contact night-coverage [email or web address]  "

## 2024-09-27 NOTE — Progress Notes (Signed)
 Echocardiogram Attempted exam at 1150am. Patient in dialysis; will attempt again later.  [name] 09/27/2024, 11:52 AM

## 2024-09-27 NOTE — Progress Notes (Signed)
 " [facility] Progress Note   Subjective:   Seen on HD. Reports she continues to have chest pains with deep breaths, mainly on R side. Denies HA, dizziness, nausea.   Objective Vitals:   09/26/24 0933 09/26/24 1750 09/26/24 1957 09/27/24 0357  BP: (!) 127/102 102/71 103/80 (!) 114/91  Pulse: 87 87 83 75  Resp: 17 16 18 18   Temp: 98.3 F (36.8 C) 99 F (37.2 C) 98.8 F (37.1 C) (!) 97.5 F (36.4 C)  TempSrc: Oral Oral    SpO2: 95% 94% 93%   Weight:      Height:       Physical Exam General: Well appearing, NAD. Room air Heart: RRR Lungs: CTAB, respirations unlabored on RA Abdomen: soft Extremities: 1+ BLE edema Dialysis Access: TDC, L arm bandaged, trace hand edema  Additional Objective Labs: Basic Metabolic Panel: Recent Labs  Lab 09/21/24 0353 09/22/24 0603 09/23/24 0637 09/24/24 0627 09/25/24 0303 09/26/24 1130  NA 133* 128*   < > 129* 128* 129*  K 3.9 5.5*   < > 4.8 5.7* 5.4*  CL 92* 92*   < > 89* 88* 89*  CO2 24 21*   < > 25 22 24   GLUCOSE 83 79   < > 85 73 91  BUN 42* 38*   < > 41* 52* 38*  CREATININE 4.99* 4.49*   < > 4.74* 6.23* 5.05*  CALCIUM  9.6 9.5   < > 9.8 9.8 10.1  PHOS 3.0 3.5  --  5.8*  --   --    < > = values in this interval not displayed.   Liver Function Tests: No results for input(s): AST, ALT, ALKPHOS, BILITOT, PROT, ALBUMIN  in the last 168 hours. No results for input(s): LIPASE, AMYLASE in the last 168 hours. CBC: Recent Labs  Lab 09/22/24 0603 09/23/24 0637 09/24/24 0627 09/25/24 0303 09/26/24 1130  WBC 11.6* 8.1 7.9 8.4 11.4*  HGB 8.0* 7.4* 7.1* 7.4* 8.1*  HCT 26.2* 24.5* 23.7* 24.7* 26.3*  MCV 93.2 90.1 91.2 90.1 88.3  PLT 308 350 331 348 348   Blood Culture    Component Value Date/Time   SDES BLOOD RIGHT HAND 09/16/2024 1326   SPECREQUEST  09/16/2024 1326    BOTTLES DRAWN AEROBIC ONLY Blood Culture results may not be optimal due to an inadequate volume of blood received in culture bottles    CULT  09/16/2024 1326    NO GROWTH 5 DAYS Performed at [facility], [address] [place], [place] [address]    REPTSTATUS 09/21/2024 FINAL 09/16/2024 1326    Cardiac Enzymes: No results for input(s): CKTOTAL, CKMB, CKMBINDEX, TROPONINI in the last 168 hours. CBG: No results for input(s): GLUCAP in the last 168 hours. Iron  Studies: No results for input(s): IRON , TIBC, TRANSFERRIN, FERRITIN in the last 72 hours. @lablastinr3 @ Studies/Results: DG CHEST PORT 1 VIEW Result Date: 09/26/2024 CLINICAL DATA:  Shortness of breath. EXAM: PORTABLE CHEST 1 VIEW COMPARISON:  Multiple chest radiographs with the most recent dated 09/16/2024 FINDINGS: Stable positioning of right-sided tunneled dialysis catheter. Massive enlargement of the cardiac silhouette again noted. This appears increased since recent chest radiographs, especially since 09/08/2024. Findings are suspicious for enlargement of the underlying known pericardial effusion. No evidence associated pulmonary edema or pneumothorax. There may be a pleural effusion on the left and associated left basilar atelectasis. IMPRESSION: 1. Massive enlargement of the cardiac silhouette appears increased since recent chest radiographs, especially since 09/08/2024. Findings are suspicious for enlargement of  the underlying known pericardial effusion. 2. Possible left pleural effusion and left basilar atelectasis. Electronically Signed   By: Marcey Moan M.D.   On: 09/26/2024 15:30   VAS US  UPPER EXTREMITY VENOUS DUPLEX Result Date: 09/26/2024 UPPER VENOUS STUDY  Patient Name:  VESPER TRANT Christon  Date of Exam:   09/25/2024 Medical Rec #: [id number]              Accession #:    [id number] Date of Birth: June 24, 1996              Patient Gender: F Patient Age:   29 years Exam Location:  [facility] Procedure:      VAS US  UPPER EXTREMITY VENOUS DUPLEX Referring Phys: [facility]  --------------------------------------------------------------------------------  Indications: Edema, rule out DVT, and Post-op Comparison Study: Previous study on 1.14.2026. Performing Technologist: Edilia Elden Appl  Examination Guidelines: A complete evaluation includes B-mode imaging, spectral Doppler, color Doppler, and power Doppler as needed of all accessible portions of each vessel. Bilateral testing is considered an integral part of a complete examination. Limited examinations for reoccurring indications may be performed as noted.  Right Findings: +----------+------------+---------+-----------+----------+-------+ RIGHT     CompressiblePhasicitySpontaneousPropertiesSummary +----------+------------+---------+-----------+----------+-------+ IJV           Full       No        Yes                      +----------+------------+---------+-----------+----------+-------+ Subclavian    Full       Yes       Yes                      +----------+------------+---------+-----------+----------+-------+  Left Findings: +----------+------------+---------+-----------+----------+-------+ LEFT      CompressiblePhasicitySpontaneousPropertiesSummary +----------+------------+---------+-----------+----------+-------+ IJV           Full       Yes       Yes                      +----------+------------+---------+-----------+----------+-------+ Subclavian    Full       Yes       Yes                      +----------+------------+---------+-----------+----------+-------+ Axillary      Full       Yes       Yes                      +----------+------------+---------+-----------+----------+-------+ Brachial      Full       Yes       Yes                      +----------+------------+---------+-----------+----------+-------+ Radial        Full                                          +----------+------------+---------+-----------+----------+-------+ Ulnar         Full                                           +----------+------------+---------+-----------+----------+-------+ Cephalic      Full       Yes  Yes                      +----------+------------+---------+-----------+----------+-------+ Basilic       Full       Yes       Yes                      +----------+------------+---------+-----------+----------+-------+ Unable to properly compress near the antecubital fossa due to bandages, wound vac and patient's post-op pain. No flow detected in graft or AVF.  Summary:  Right: No evidence of thrombosis in the subclavian.  Left: No evidence of deep vein thrombosis in the upper extremity. No evidence of superficial vein thrombosis in the upper extremity.  *See table(s) above for measurements and observations.  Diagnosing physician: Fonda Rim Electronically signed by Fonda Rim on 09/26/2024 at 5:18:26 AM.    Final    Medications:   amLODipine   10 mg Oral QHS   carvedilol   25 mg Oral BID WC   Chlorhexidine  Gluconate Cloth  6 each Topical Q0600   cloNIDine   0.1 mg Oral Daily   darbepoetin (ARANESP ) injection - DIALYSIS  200 mcg Subcutaneous Q Sun-1800   doxercalciferol   5 mcg Intravenous Q M,W,F-HD   furosemide   80 mg Oral Daily   irbesartan   300 mg Oral Daily   pantoprazole   40 mg Oral Daily   predniSONE   40 mg Oral Q breakfast   sevelamer  carbonate  2.4 g Oral TID WC    Dialysis Orders: MWF - [facility] 3.5hrs, BFR 400, DFR 500,  EDW 68.4kg, 2K/ 2Ca, Heparin  3000 units bolus - Mircera 225 mcg q2wks - last 09/11/24 - Hectorol  5mcg IV qHD - last 09/13/24 - Sensipar  90mg  with HD - last 09/13/24  Assessment/Plan: AVF malfunction - VVS following. Hx multiple revisions in the past, last 1/7. Scheduled ligation aborted 2nd pt c/o CP. S/p ligation 1/16. S/p I&D with wound vac placement 1/20. On Vanc/Cefepime .  Chest Pain - Troponins elevated initially, cardiology felt not anginal. CTA with volume overload on 1/17. CXR yesterday with  enlarging pericardial effusion, echo has been ordered.  ESRD -  on HD MWF. Remains volume overload but improved. Reinforced fluid restrictions. UF goal 4.5L again today.  Hypertension/volume  - BP meds resumed, UF as tolerated with HD as above Anemia of CKD - Hgb 8.1, on high dose ESA Secondary Hyperparathyroidism -  Ca ok, Phos high - restarted binders (sevelamer ) Nutrition - Renal diet with fluid restriction GERD - Had been getting sucralfate , stopped. Continue PPI  Lucie Collet, PA-C 09/27/2024, 8:44 AM  [facility] Pager: [phone]   "

## 2024-09-27 NOTE — Hospital Course (Addendum)
 Linda [name] is a 29 y.o. female with a history of hypertension, G6PD deficiency, CHF, lupus, ESRD on HD.  Patient presented secondary to left arm swelling in setting of recent AV fistula surgery and recent wound dehiscence. Vascular surgery consulted and performed AV fistula washout with wound vac placement. Patient with significant pleuritic chest pain. Workup significant for worsened pericardial effusion with symptoms consistent with pericarditis. Prednisone  with taper started.

## 2024-09-27 NOTE — Progress Notes (Signed)
 Echocardiogram 2D Echocardiogram has been performed.  Linda [name] 09/27/2024, 3:29 PM

## 2024-09-27 NOTE — Plan of Care (Signed)
  Problem: Education: Goal: Knowledge of General Education information will improve Description: Including pain rating scale, medication(s)/side effects and non-pharmacologic comfort measures Outcome: Progressing   Problem: Health Behavior/Discharge Planning: Goal: Ability to manage health-related needs will improve Outcome: Progressing   Problem: Clinical Measurements: Goal: Ability to maintain clinical measurements within normal limits will improve Outcome: Progressing Goal: Cardiovascular complication will be avoided Outcome: Progressing   Problem: Pain Managment: Goal: General experience of comfort will improve and/or be controlled Outcome: Progressing

## 2024-09-27 NOTE — Plan of Care (Signed)
   Problem: Clinical Measurements: Goal: Ability to maintain clinical measurements within normal limits will improve Outcome: Progressing Goal: Will remain free from infection Outcome: Progressing Goal: Diagnostic test results will improve Outcome: Progressing   Problem: Activity: Goal: Risk for activity intolerance will decrease Outcome: Progressing

## 2024-09-28 ENCOUNTER — Inpatient Hospital Stay (HOSPITAL_COMMUNITY)

## 2024-09-28 DIAGNOSIS — T82898A Other specified complication of vascular prosthetic devices, implants and grafts, initial encounter: Secondary | ICD-10-CM | POA: Diagnosis not present

## 2024-09-28 DIAGNOSIS — I309 Acute pericarditis, unspecified: Secondary | ICD-10-CM | POA: Diagnosis not present

## 2024-09-28 DIAGNOSIS — N186 End stage renal disease: Secondary | ICD-10-CM | POA: Diagnosis not present

## 2024-09-28 DIAGNOSIS — I3139 Other pericardial effusion (noninflammatory): Secondary | ICD-10-CM | POA: Diagnosis not present

## 2024-09-28 LAB — TYPE AND SCREEN
ABO/RH(D): O POS
Antibody Screen: NEGATIVE
Donor AG Type: NEGATIVE
Unit division: 0
Unit division: 0
Unit division: 0

## 2024-09-28 LAB — BPAM RBC
Blood Product Expiration Date: 202602122359
Blood Product Expiration Date: 202602222359
ISSUE DATE / TIME: 202601261012
PRODUCT CODE: 202602122359
Unit Type and Rh: 5100
Unit Type and Rh: 5100
Unit Type and Rh: 5100

## 2024-09-28 MED ORDER — SODIUM CHLORIDE 0.9% FLUSH
3.0000 mL | Freq: Two times a day (BID) | INTRAVENOUS | Status: DC
  Administered 2024-09-28 – 2024-09-29 (×2): 3 mL via INTRAVENOUS

## 2024-09-28 MED ORDER — SODIUM CHLORIDE 0.9% FLUSH: 3.0000 mL | INTRAVENOUS | Status: DC | PRN

## 2024-09-28 MED ORDER — SODIUM CHLORIDE 0.9 % IV SOLN: 250.0000 mL | INTRAVENOUS | Status: DC | PRN

## 2024-09-28 MED ORDER — CHLORHEXIDINE GLUCONATE CLOTH 2 % EX PADS
6.0000 | MEDICATED_PAD | Freq: Every day | CUTANEOUS | Status: DC
  Administered 2024-10-01 – 2024-10-03 (×2): 6 via TOPICAL

## 2024-09-28 NOTE — TOC Progression Note (Signed)
 Transition of Care [facility]) - Progression Note    Patient Details  Name: Linda [name] MRN: [id number] Date of Birth: Nov 17, 1995  Transition of Care [facility]) CM/SW Contact  Tom-Johnson, Maddalynn Barnard Daphne, RN Phone Number: 09/28/2024, 2:47 PM  Clinical Narrative:     CM received Proof of Delivery from Alston Gore with [employer] via Email for patient's Wound Vac. CM retrieved Wound Vac from 6N and delivered to patient at bedside.  CM will continue to follow as patient progresses towards discharge.                     Expected Discharge Plan and Services                                   HH Arranged: RN (Wound Vac Exchange) HH Agency: [facility] Date HH Agency Contacted: 09/23/24 Time HH Agency Contacted: 1015 Representative spoke with at [facility] Agency: [facility] via [employer] Drivers of Health (SDOH) Interventions SDOH Screenings   Food Insecurity: No Food Insecurity (09/14/2024)  Housing: Low Risk (09/14/2024)  Transportation Needs: No Transportation Needs (09/14/2024)  Utilities: Not At Risk (09/14/2024)  Alcohol Screen: Low Risk (02/03/2024)  Depression (PHQ2-9): Low Risk (02/03/2024)  Recent Concern: Depression (PHQ2-9) - Medium Risk (01/26/2024)  Financial Resource Strain: Low Risk (02/03/2024)  Physical Activity: Insufficiently Active (02/03/2024)  Social Connections: Moderately Isolated (09/14/2024)  Stress: No Stress Concern Present (02/03/2024)  Tobacco Use: High Risk (09/19/2024)  Health Literacy: Adequate Health Literacy (02/03/2024)    Readmission Risk Interventions    09/15/2024    2:40 PM 11/15/2023    2:53 PM 10/25/2023    3:32 PM  Readmission Risk Prevention Plan  Transportation Screening Complete Complete Complete  HRI or Home Care Consult   Complete  Social Work Consult for Recovery Care Planning/Counseling   Complete  Palliative Care Screening   Complete  Medication Review Oceanographer) Referral to Pharmacy Referral to Pharmacy  Referral to Pharmacy  PCP or Specialist appointment within 3-5 days of discharge Complete Complete   HRI or Home Care Consult Complete Complete   SW Recovery Care/Counseling Consult Complete Complete   Palliative Care Screening Not Applicable Not Applicable   Skilled Nursing Facility Not Applicable Not Applicable

## 2024-09-28 NOTE — Progress Notes (Addendum)
 "  Progress Note  Patient Name: Linda [name] Date of Encounter: 09/28/2024 [facility] Cardiologist: Madonna Large, DO   Interval Summary   Shard chest pain and shortness of breath has been intermittently improving and then worsening.  Chest pain is mainly right sided and worse with inspiration Shortness of breath worse with laying flat Leg pain with ambulation  Vital Signs Vitals:   09/27/24 1718 09/27/24 2148 09/28/24 0624 09/28/24 0851  BP: 120/89 (!) 138/94 122/88 133/88  Pulse: 77 76 72 67  Resp: 19 20 18 18   Temp: 98.1 F (36.7 C) 98.1 F (36.7 C) 98.1 F (36.7 C) 98.1 F (36.7 C)  TempSrc:  Oral Oral Oral  SpO2: 97% 96% 90% (!) 86%  Weight:      Height:        Intake/Output Summary (Last 24 hours) at 09/28/2024 1259 Last data filed at 09/28/2024 0851 Gross per 24 hour  Intake 1520 ml  Output 0 ml  Net 1520 ml      09/27/2024   12:51 PM 09/27/2024    8:29 AM 09/25/2024    1:31 PM  Last 3 Weights  Weight (lbs) 153 lb 14.1 oz 163 lb 12.8 oz 162 lb 14.7 oz   Weight (kg) 69.8 kg 74.3 kg 73.9 kg      Significant value      Telemetry/ECG  Not on telemetry - Personally Reviewed  Physical Exam  GEN: No acute distress.   Cardiac: RRR, possible pericardial rub Respiratory: crackle to right base MS: No edema, thickened skin overlying. No pain to palpation.   Assessment & Plan  Linda [name] is a 29 y.o. female with a hx of SLE, HTN, ESRD on MD (M/W/F), calciphlaxis, HFmrEF, and anemia who was admitted 1/7 for AV fistula creation by vascular surgery. She was discharged then re-presented to the ED 1/14 for fistula complication [dehiscence]. She has now undergone left arm brachiobasilic fistula ligation with I&D and placement of a wound vac. No growth on wound culture.  Cardiology was consulted 1/15 for chest pain worsened by movement and improved by pain medication. CT imaging showed LLL consolidation, completed abx treatment for PNA. CT  imaging also showed a moderate pericardial effusion prompting repeat echocardiogram 1/18 which showed LVEF 40-45% with global hypokinesis and severe concentric LVH. GII DD. PASP 45.7 mmHg. Moderate focal pericardial effusion. It was suspected her chest pain was 2/2 to this focal pericardial effusion and would improve with HD. On 1/21 cardiology signed off with plan to repeat echo in 2-4 weeks.   Since this time patient was started on prednisone  1/27 for her chest pain. While her chest pain was initially improving with volume management, she developed worsening chest pain that prompted re-peat echocardiogram 1/28 which showed stable LV dysfunction but worsening pericardial effusion, now large though no evidence of cardiac tamponade. Cardiology was asked to re-engage.   Pericardial effusion History of pericardial effusion 10/2023 and at that time suspected 2/2 to uremia Currently admitted with worsening pericardial effusion despite aggressive volume management with HD. At this time she is hemodynamically stable, echo was also without evidence of tamponade.  She has received three doses of predisone 40 mg. Shared did not notice improvement in her chest discomfort.   I think we need to rule out lupus flair, interesting that the steroids did help with her leg pain, wonder if arthritis from an exacerbation is contributing. She did have a recent PNA that was treated with antibiotics.  Will need to  continue monitor closely and consider a pericardiocentesis.  Chronic systolic and diastolic heart failure Serial echo listed above.  On exam, appears mildly volume up despite 4.5L pulled yesterday Volume management per HD. On Lasix  80 mg daily and HD.   Continue coreg  25 mg BID Continue irbesartan  300 mg Renal function precludes further GDMT titration  Hypertension BP: 136/88 Followed in hypertension clinic outpatient.   Continue amlodipine  10 mg Continue Coreg  25 mg BID Continue clonidine  0.1  mg Continue irbesaratn 300 mg Continue lasix  80 mg [per nephrology]  Per primary SLE ESRD on HD Hyperkalemia Anemia Fistula complication- vascular following Calciphlaxis    For questions or updates, please contact [facility] Please consult [email or web address] for contact info under     Signed, [name], PA-C   Patient seen and examined, note reviewed with the signed Advanced Practice Provider. I personally reviewed laboratory data, imaging studies and relevant notes. I independently examined the patient and formulated the important aspects of the plan. I have personally discussed the plan with the patient and/or family. Comments or changes to the note/plan are indicated below.  Patient seen and examined at her bedside. I was able to speak with her while her aunt was on the phone. She reports joint pain.  We are seeing her for worsening pericardial effusion.   Large pericardial effusion  Chronic midrange heart failure EF 40-45% Hypertension  Elevated troponin suspect demand ischemia   The pericardial effusion seems to be worsening now large, mild symptoms but thankfully no evidence of cardiac tamponade. She will benefit from a pericardiocentesis.  We will plan for this tomorrow likely after hemodialysis. I do not expect that this will improve with her HD. I am concern that this may progress. I spoke to the patient she is in agreement.   She has been taking Lasix  80 mg daily.  She still makes urine.  She tells me that she does not believe that the Lasix  is helping as much as it used to.  Consider switching to torsemide but will defer to nephrology for this change.  We will continue current dose of amlodipine  10 mg daily, Coreg  25 mg twice daily, she is on clonidine  as well 0.1 mg daily as well as irbesartan .     [name] DO, MS [facility] Attending Cardiologist [facility]  [address] [place], [place] [address] [phone] Website:  [email or web address]   "

## 2024-09-28 NOTE — Progress Notes (Signed)
 "  [facility] Progress Note   Subjective:   Reports breathing is about the same. Chest discomfort is better when sitting up. Denies dizziness, nausea.   Objective Vitals:   09/27/24 1718 09/27/24 2148 09/28/24 0624 09/28/24 0851  BP: 120/89 (!) 138/94 122/88 133/88  Pulse: 77 76 72 67  Resp: 19 20 18 18   Temp: 98.1 F (36.7 C) 98.1 F (36.7 C) 98.1 F (36.7 C) 98.1 F (36.7 C)  TempSrc:  Oral Oral Oral  SpO2: 97% 96% 90% (!) 86%  Weight:      Height:       Physical Exam General: alert female in NAD Lungs: respirations unlabored on RA Abdomen: non-distended Extremities: no LE edema, L arm edema improved Dialysis Access: [facility]  Additional Objective Labs: Basic Metabolic Panel: Recent Labs  Lab 09/22/24 0603 09/23/24 0637 09/24/24 0627 09/25/24 0303 09/26/24 1130 09/27/24 0830  NA 128*   < > 129* 128* 129* 132*  K 5.5*   < > 4.8 5.7* 5.4* 5.4*  CL 92*   < > 89* 88* 89* 91*  CO2 21*   < > 25 22 24 25   GLUCOSE 79   < > 85 73 91 116*  BUN 38*   < > 41* 52* 38* 48*  CREATININE 4.49*   < > 4.74* 6.23* 5.05* 6.09*  CALCIUM  9.5   < > 9.8 9.8 10.1 10.4*  PHOS 3.5  --  5.8*  --   --  7.3*   < > = values in this interval not displayed.   Liver Function Tests: Recent Labs  Lab 09/27/24 0830  ALBUMIN  3.2*   No results for input(s): LIPASE, AMYLASE in the last 168 hours. CBC: Recent Labs  Lab 09/23/24 0637 09/24/24 0627 09/25/24 0303 09/26/24 1130 09/27/24 0830  WBC 8.1 7.9 8.4 11.4* 17.8*  HGB 7.4* 7.1* 7.4* 8.1* 7.5*  HCT 24.5* 23.7* 24.7* 26.3* 23.6*  MCV 90.1 91.2 90.1 88.3 85.8  PLT 350 331 348 348 315   Blood Culture    Component Value Date/Time   SDES BLOOD RIGHT HAND 09/16/2024 1326   SPECREQUEST  09/16/2024 1326    BOTTLES DRAWN AEROBIC ONLY Blood Culture results may not be optimal due to an inadequate volume of blood received in culture bottles   CULT  09/16/2024 1326    NO GROWTH 5 DAYS Performed at [facility],  [address], [place], [place] [address]    REPTSTATUS 09/21/2024 FINAL 09/16/2024 1326    Cardiac Enzymes: No results for input(s): CKTOTAL, CKMB, CKMBINDEX, TROPONINI in the last 168 hours. CBG: No results for input(s): GLUCAP in the last 168 hours. Iron  Studies: No results for input(s): IRON , TIBC, TRANSFERRIN, FERRITIN in the last 72 hours. @lablastinr3 @ Studies/Results: ECHOCARDIOGRAM LIMITED Result Date: 09/27/2024    ECHOCARDIOGRAM LIMITED REPORT   Patient Name:   Linda [name] Date of Exam: 09/27/2024 Medical Rec #:  [id number]             Height:       66.0 in Accession #:    [id number]            Weight:       153.9 lb Date of Birth:  05-23-1996             BSA:          1.789 m Patient Age:    29 years              BP:  125/91 mmHg Patient Gender: F                     HR:           79 bpm. Exam Location:  Inpatient Procedure: Limited Echo, Cardiac Doppler and Color Doppler (Both Spectral and            Color Flow Doppler were utilized during procedure). Indications:    Pericardial effusion  History:        Patient has prior history of Echocardiogram examinations, most                 recent 09/17/2024. CHF, Abnormal ECG, Arrythmias:Tachycardia,                 Signs/Symptoms:Shortness of Breath; Risk Factors:Hypertension                 and Current Smoker.  Sonographer:    Juliene Rucks Referring Phys: [id number] ELVAN SOR IMPRESSIONS  1. Left ventricular ejection fraction, by estimation, is 40 to 45%. The left ventricle has mildly decreased function. The left ventricle demonstrates global hypokinesis. There is severe concentric left ventricular hypertrophy. Left ventricular diastolic  parameters are consistent with Grade II diastolic dysfunction (pseudonormalization).  2. Right ventricular systolic function is moderately reduced. The right ventricular size is mildly enlarged.  3. Left atrial size was mild to moderately dilated.  4. Large pericardial effusion. The  pericardial effusion is circumferential. There is no evidence of cardiac tamponade.  5. Trivial mitral valve regurgitation.  6. The aortic valve is tricuspid. There is mild calcification of the aortic valve. Conclusion(s)/Recommendation(s): The pericardial effusion has increased substantially since 09/17/24. It is now large. IVC size and mitral inflow pattern not assessed on this study but there is no evidence of RA or RV colaapse to suggest tamponade. FINDINGS  Left Ventricle: Left ventricular ejection fraction, by estimation, is 40 to 45%. The left ventricle has mildly decreased function. The left ventricle demonstrates global hypokinesis. There is severe concentric left ventricular hypertrophy. Left ventricular diastolic parameters are consistent with Grade II diastolic dysfunction (pseudonormalization). Right Ventricle: The right ventricular size is mildly enlarged. Right ventricular systolic function is moderately reduced. Left Atrium: Left atrial size was mild to moderately dilated. Pericardium: A large pericardial effusion is present. The pericardial effusion is circumferential. There is no evidence of cardiac tamponade. Mitral Valve: Trivial mitral valve regurgitation. Aortic Valve: The aortic valve is tricuspid. There is mild calcification of the aortic valve. Venous: The inferior vena cava was not well visualized. LEFT VENTRICLE PLAX 2D LVIDd:         4.60 cm   Diastology LVIDs:         3.50 cm   LV e' medial:    6.74 cm/s LV PW:         1.40 cm   LV E/e' medial:  10.9 LV IVS:        1.60 cm   LV e' lateral:   6.31 cm/s LVOT diam:     2.10 cm   LV E/e' lateral: 11.6 LVOT Area:     3.46 cm  RIGHT VENTRICLE RV Basal diam:  3.30 cm RV Mid diam:    3.20 cm LEFT ATRIUM             Index        RIGHT ATRIUM           Index LA diam:        4.10 cm  2.29 cm/m   RA Area:     17.20 cm LA Vol (A2C):   85.0 ml 47.51 ml/m  RA Volume:   41.90 ml  23.42 ml/m LA Vol (A4C):   46.8 ml 26.16 ml/m LA Biplane Vol: 67.4 ml  37.68 ml/m   AORTA Ao Root diam: 3.40 cm MITRAL VALVE MV Area (PHT): 3.37 cm    SHUNTS MV Decel Time: 225 msec    Systemic Diam: 2.10 cm MV E velocity: 73.40 cm/s MV A velocity: 79.70 cm/s MV E/A ratio:  0.92 Toribio Fuel MD Electronically signed by Toribio Fuel MD Signature Date/Time: 09/27/2024/9:10:49 PM    Final    Medications:   amLODipine   10 mg Oral QHS   carvedilol   25 mg Oral BID WC   Chlorhexidine  Gluconate Cloth  6 each Topical Q0600   cloNIDine   0.1 mg Oral Daily   darbepoetin (ARANESP ) injection - DIALYSIS  200 mcg Subcutaneous Q Sun-1800   doxercalciferol   5 mcg Intravenous Q M,W,F-HD   furosemide   80 mg Oral Daily   irbesartan   300 mg Oral Daily   pantoprazole   40 mg Oral Daily   sevelamer  carbonate  2.4 g Oral TID WC    Dialysis Orders: MWF - [facility] 3.5hrs, BFR 400, DFR 500,  EDW 68.4kg, 2K/ 2Ca, Heparin  3000 units bolus - Mircera 225 mcg q2wks - last 09/11/24 - Hectorol  5mcg IV qHD - last 09/13/24 - Sensipar  90mg  with HD - last 09/13/24  Assessment/Plan: AVF malfunction - VVS following. Hx multiple revisions in the past, last 1/7. Scheduled ligation aborted 2nd pt c/o CP. S/p ligation 1/16. S/p I&D with wound vac placement 1/20. On Vanc/Cefepime .  Chest Pain - Troponins elevated initially, cardiology felt not anginal. CTA with volume overload on 1/17. CXR yesterday with enlarging pericardial effusion, cardiology consult pending.  ESRD -  on HD MWF. Remains volume overload but improved. Reinforced fluid restrictions.  Hypertension/volume  - BP meds resumed, UF as tolerated with HD as above Anemia of CKD - Hgb 7.5, on high dose ESA Secondary Hyperparathyroidism -  Ca ok, Phos high - restarted binders (sevelamer ) Nutrition - Renal diet with fluid restriction GERD - Had been getting sucralfate , stopped. Continue PPI    Lucie Collet, PA-C 09/28/2024, 12:31 PM  [facility] Pager: [phone]   "

## 2024-09-28 NOTE — Progress Notes (Signed)
 "  PROGRESS NOTE    Linda [name]  FMW:[id number] DOB: Oct 13, 1995 DOA: 09/13/2024 PCP: [name], NP   Brief Narrative: Linda [name] is a 29 y.o. female with a history of hypertension, G6PD deficiency, CHF, lupus, ESRD on HD.  Patient presented secondary to left arm swelling in setting of recent AV fistula surgery and recent wound dehiscence. Vascular surgery consulted and performed AV fistula washout with wound vac placement.   Assessment and Plan:  Left upper extremity edema In setting of recent AV fistula surgery. Resultant dehiscence of wound. Vascular surgery consulted and performed a left arm brachiobasilic fistula ligation with I&D and placement of a wound vac. No growth on wound culture. - Vascular surgery recommendations: pending  Pericardial effusion Noted on Transthoracic Echocardiogram. No clinical evidence of tamponade. Repeat limited echo performed on 1/28 and is significant for a significant enlargement of the effusion. - Re-consult cardiology  Chest pain Patient with troponin of 118 with negative delta of 112. Echo with slightly improved LVEF of 40-45% with global hypokinesis. Cardiology was consulted for workup/management. Symptoms appeared to improved with ongoing treatment for volume overload. Prednisone  started for concern for musculoskeletal component. Patient started on prednisone  for likely musculoskeletal etiology. Symptoms continue to be intermittent. Significant episode last night and symptoms are worse with exertion.  Dyspnea on exertion Concern this could be related to patient's pericardial effusion vs fluid overload. Last hemodialysis was 1/28.  - Ambulatory pulse ox - Chest x-ray - Cardiology consultation for pericardial effusion  ESRD on HD MWF schedule. Nephrology consulted for ongoing hemodialysis needs.  Hyperkalemia Patient given Lokelma . Continued management per nephrology with hemodialysis.  Leukocytosis Worsening  WBC from 1/27. Appears to correlate with initiation of prednisone . - Repeat CBC in AM  Acute on chronic HFrEF Managed via hemodialysis.  GERD - Continue Protonix  and sucralfate   Primary hypertension - Continue amlodipine , carvedilol , clonidine  and Lasix   Hyponatremia Secondary to volume overload. Management per HD.  Vitamin D deficiency Patient started on doxercalciferol  with HD.   DVT prophylaxis: SCDs Code Status:   Code Status: Full Code Family Communication: None at bedside Disposition Plan: Discharge pending ongoing vascular surgery recommendations   Consultants:  Vascular surgery Cardiology  Procedures:  Transthoracic Echocardiogram  Antimicrobials:     Subjective: Patient reports improved chest pain. No other concerns.  Objective: BP 133/88   Pulse 67   Temp 98.1 F (36.7 C) (Oral)   Resp 18   Ht 5' 6 (1.676 m)   Wt 69.8 kg   LMP  (LMP Unknown)   SpO2 (!) 86%   BMI 24.84 kg/m   Examination:  General exam: Appears calm and comfortable. Respiratory system: Clear to auscultation mostly throughout except some rales at bilateral bases. Respiratory effort normal. Cardiovascular system: S1 & S2 heard, RRR.  Gastrointestinal system: Abdomen is nondistended, soft and nontender. Normal bowel sounds heard. Central nervous system: Alert and oriented. No focal neurological deficits. Musculoskeletal: BLE edema. No calf tenderness Psychiatry: Judgement and insight appear normal. Mood & affect appropriate.    Data Reviewed: I have personally reviewed following labs and imaging studies  CBC Lab Results  Component Value Date   WBC 17.8 (H) 09/27/2024   RBC 2.75 (L) 09/27/2024   HGB 7.5 (L) 09/27/2024   HCT 23.6 (L) 09/27/2024   MCV 85.8 09/27/2024   MCH 27.3 09/27/2024   PLT 315 09/27/2024   MCHC 31.8 09/27/2024   RDW 21.7 (H) 09/27/2024   LYMPHSABS 2.0 09/13/2024   MONOABS 1.2 (H)  09/13/2024   EOSABS 0.1 09/13/2024   BASOSABS 0.0 09/13/2024      Last metabolic panel Lab Results  Component Value Date   NA 132 (L) 09/27/2024   K 5.4 (H) 09/27/2024   CL 91 (L) 09/27/2024   CO2 25 09/27/2024   BUN 48 (H) 09/27/2024   CREATININE 6.09 (H) 09/27/2024   GLUCOSE 116 (H) 09/27/2024   GFRNONAA 9 (L) 09/27/2024   GFRAA 19 (L) 03/27/2019   CALCIUM  10.4 (H) 09/27/2024   PHOS 7.3 (H) 09/27/2024   PROT 7.9 09/13/2024   ALBUMIN  3.2 (L) 09/27/2024   BILITOT 1.1 09/13/2024   ALKPHOS 174 09/13/2024   AST 26 09/13/2024   ALT 7 09/13/2024   ANIONGAP 17 (H) 09/27/2024    GFR: Estimated Creatinine Clearance: 12.8 mL/min (A) (by C-G formula based on SCr of 6.09 mg/dL (H)).  No results found for this or any previous visit (from the past 240 hours).    Radiology Studies: ECHOCARDIOGRAM LIMITED Result Date: 09/27/2024    ECHOCARDIOGRAM LIMITED REPORT   Patient Name:   Linda [name] Date of Exam: 09/27/2024 Medical Rec #:  [id number]             Height:       66.0 in Accession #:    [id number]            Weight:       153.9 lb Date of Birth:  [birth year]             BSA:          1.789 m Patient Age:    28 years              BP:           125/91 mmHg Patient Gender: F                     HR:           79 bpm. Exam Location:  Inpatient Procedure: Limited Echo, Cardiac Doppler and Color Doppler (Both Spectral and            Color Flow Doppler were utilized during procedure). Indications:    Pericardial effusion  History:        Patient has prior history of Echocardiogram examinations, most                 recent 09/17/2024. CHF, Abnormal ECG, Arrythmias:Tachycardia,                 Signs/Symptoms:Shortness of Breath; Risk Factors:Hypertension                 and Current Smoker.  Sonographer:    Juliene Rucks Referring Phys: [id number] ELVAN SOR IMPRESSIONS  1. Left ventricular ejection fraction, by estimation, is 40 to 45%. The left ventricle has mildly decreased function. The left ventricle demonstrates global hypokinesis. There is severe  concentric left ventricular hypertrophy. Left ventricular diastolic  parameters are consistent with Grade II diastolic dysfunction (pseudonormalization).  2. Right ventricular systolic function is moderately reduced. The right ventricular size is mildly enlarged.  3. Left atrial size was mild to moderately dilated.  4. Large pericardial effusion. The pericardial effusion is circumferential. There is no evidence of cardiac tamponade.  5. Trivial mitral valve regurgitation.  6. The aortic valve is tricuspid. There is mild calcification of the aortic valve. Conclusion(s)/Recommendation(s): The pericardial effusion has increased substantially since 09/17/24. It is now large. IVC size and mitral inflow  pattern not assessed on this study but there is no evidence of RA or RV colaapse to suggest tamponade. FINDINGS  Left Ventricle: Left ventricular ejection fraction, by estimation, is 40 to 45%. The left ventricle has mildly decreased function. The left ventricle demonstrates global hypokinesis. There is severe concentric left ventricular hypertrophy. Left ventricular diastolic parameters are consistent with Grade II diastolic dysfunction (pseudonormalization). Right Ventricle: The right ventricular size is mildly enlarged. Right ventricular systolic function is moderately reduced. Left Atrium: Left atrial size was mild to moderately dilated. Pericardium: A large pericardial effusion is present. The pericardial effusion is circumferential. There is no evidence of cardiac tamponade. Mitral Valve: Trivial mitral valve regurgitation. Aortic Valve: The aortic valve is tricuspid. There is mild calcification of the aortic valve. Venous: The inferior vena cava was not well visualized. LEFT VENTRICLE PLAX 2D LVIDd:         4.60 cm   Diastology LVIDs:         3.50 cm   LV e' medial:    6.74 cm/s LV PW:         1.40 cm   LV E/e' medial:  10.9 LV IVS:        1.60 cm   LV e' lateral:   6.31 cm/s LVOT diam:     2.10 cm   LV E/e' lateral:  11.6 LVOT Area:     3.46 cm  RIGHT VENTRICLE RV Basal diam:  3.30 cm RV Mid diam:    3.20 cm LEFT ATRIUM             Index        RIGHT ATRIUM           Index LA diam:        4.10 cm 2.29 cm/m   RA Area:     17.20 cm LA Vol (A2C):   85.0 ml 47.51 ml/m  RA Volume:   41.90 ml  23.42 ml/m LA Vol (A4C):   46.8 ml 26.16 ml/m LA Biplane Vol: 67.4 ml 37.68 ml/m   AORTA Ao Root diam: 3.40 cm MITRAL VALVE MV Area (PHT): 3.37 cm    SHUNTS MV Decel Time: 225 msec    Systemic Diam: 2.10 cm MV E velocity: 73.40 cm/s MV A velocity: 79.70 cm/s MV E/A ratio:  0.92 Toribio Fuel MD Electronically signed by Toribio Fuel MD Signature Date/Time: 09/27/2024/9:10:49 PM    Final    DG CHEST PORT 1 VIEW Result Date: 09/26/2024 CLINICAL DATA:  Shortness of breath. EXAM: PORTABLE CHEST 1 VIEW COMPARISON:  Multiple chest radiographs with the most recent dated 09/16/2024 FINDINGS: Stable positioning of right-sided tunneled dialysis catheter. Massive enlargement of the cardiac silhouette again noted. This appears increased since recent chest radiographs, especially since 09/08/2024. Findings are suspicious for enlargement of the underlying known pericardial effusion. No evidence associated pulmonary edema or pneumothorax. There may be a pleural effusion on the left and associated left basilar atelectasis. IMPRESSION: 1. Massive enlargement of the cardiac silhouette appears increased since recent chest radiographs, especially since 09/08/2024. Findings are suspicious for enlargement of the underlying known pericardial effusion. 2. Possible left pleural effusion and left basilar atelectasis. Electronically Signed   By: Marcey Moan M.D.   On: 09/26/2024 15:30      LOS: 14 days    Elgin Lam, MD [facility] 09/28/2024, 11:14 AM   If 7PM-7AM, please contact night-coverage [email or web address]  "

## 2024-09-28 NOTE — Progress Notes (Signed)
 Pt ambulated in hallway on room air. Pt remained 92% during ambulation.

## 2024-09-28 NOTE — Plan of Care (Signed)
" °  Problem: Education: Goal: Knowledge of General Education information will improve Description: Including pain rating scale, medication(s)/side effects and non-pharmacologic comfort measures Outcome: Progressing   Problem: Health Behavior/Discharge Planning: Goal: Ability to manage health-related needs will improve Outcome: Progressing   Problem: Clinical Measurements: Goal: Ability to maintain clinical measurements within normal limits will improve Outcome: Progressing Goal: Will remain free from infection Outcome: Progressing Goal: Diagnostic test results will improve Outcome: Progressing Goal: Respiratory complications will improve Outcome: Progressing Goal: Cardiovascular complication will be avoided Outcome: Progressing   Problem: Activity: Goal: Risk for activity intolerance will decrease Outcome: Progressing   Problem: Nutrition: Goal: Adequate nutrition will be maintained Outcome: Progressing   Problem: Coping: Goal: Level of anxiety will decrease Outcome: Progressing   Problem: Elimination: Goal: Will not experience complications related to bowel motility Outcome: Progressing Goal: Will not experience complications related to urinary retention Outcome: Progressing   Problem: Pain Managment: Goal: General experience of comfort will improve and/or be controlled Outcome: Progressing   Problem: Safety: Goal: Ability to remain free from injury will improve Outcome: Progressing   Problem: Skin Integrity: Goal: Risk for impaired skin integrity will decrease Outcome: Progressing   Problem: Education: Goal: Knowledge of the prescribed therapeutic regimen will improve Outcome: Progressing   Problem: Bowel/Gastric: Goal: Gastrointestinal status for postoperative course will improve Outcome: Progressing   Problem: Cardiac: Goal: Ability to maintain an adequate cardiac output Outcome: Progressing Goal: Will show no evidence of cardiac arrhythmias Outcome:  Progressing   Problem: Nutritional: Goal: Will attain and maintain optimal nutritional status Outcome: Progressing   Problem: Neurological: Goal: Will regain or maintain usual level of consciousness Outcome: Progressing   Problem: Clinical Measurements: Goal: Ability to maintain clinical measurements within normal limits Outcome: Progressing Goal: Postoperative complications will be avoided or minimized Outcome: Progressing   Problem: Respiratory: Goal: Will regain and/or maintain adequate ventilation Outcome: Progressing Goal: Respiratory status will improve Outcome: Progressing   Problem: Skin Integrity: Goal: Demonstrates signs of wound healing without infection Outcome: Progressing   Problem: Urinary Elimination: Goal: Will remain free from infection Outcome: Progressing Goal: Ability to achieve and maintain adequate urine output Outcome: Progressing   Problem: Fluid Volume: Goal: Compliance with measures to maintain balanced fluid volume will improve Outcome: Progressing   Problem: Health Behavior/Discharge Planning: Goal: Ability to manage health-related needs will improve Outcome: Progressing   Problem: Nutritional: Goal: Ability to make healthy dietary choices will improve Outcome: Progressing   Problem: Clinical Measurements: Goal: Complications related to the disease process, condition or treatment will be avoided or minimized Outcome: Progressing   "

## 2024-09-28 NOTE — Progress Notes (Signed)
 Spoke with patient and her Aunt who was over the phone about pursuing a pericardiocentesis today. Patient is in agreement.   Informed Consent   Shared Decision Making/Informed Consent The risks [bleeding, infection, cardiac arrest, damage to the heart or blood vessels requiring surgery, pneumothorax requiring chest tube placement, arrhythmias, changes in blood pressure, organ injury, and rarely death], benefits [therapeutic relief of fluid collection around the heart, diagnostic support], and alternatives of a pericardiocentesis were discussed in detail with Linda [name] and she agrees to proceed.      Leontine Salen PA-C 09/28/24

## 2024-09-28 NOTE — H&P (View-Only) (Signed)
 "  Progress Note  Patient Name: Linda [name] Date of Encounter: 09/28/2024 [facility] Cardiologist: Madonna Large, DO   Interval Summary   Shard chest pain and shortness of breath has been intermittently improving and then worsening.  Chest pain is mainly right sided and worse with inspiration Shortness of breath worse with laying flat Leg pain with ambulation  Vital Signs Vitals:   09/27/24 1718 09/27/24 2148 09/28/24 0624 09/28/24 0851  BP: 120/89 (!) 138/94 122/88 133/88  Pulse: 77 76 72 67  Resp: 19 20 18 18   Temp: 98.1 F (36.7 C) 98.1 F (36.7 C) 98.1 F (36.7 C) 98.1 F (36.7 C)  TempSrc:  Oral Oral Oral  SpO2: 97% 96% 90% (!) 86%  Weight:      Height:        Intake/Output Summary (Last 24 hours) at 09/28/2024 1259 Last data filed at 09/28/2024 0851 Gross per 24 hour  Intake 1520 ml  Output 0 ml  Net 1520 ml      09/27/2024   12:51 PM 09/27/2024    8:29 AM 09/25/2024    1:31 PM  Last 3 Weights  Weight (lbs) 153 lb 14.1 oz 163 lb 12.8 oz 162 lb 14.7 oz   Weight (kg) 69.8 kg 74.3 kg 73.9 kg      Significant value      Telemetry/ECG  Not on telemetry - Personally Reviewed  Physical Exam  GEN: No acute distress.   Cardiac: RRR, possible pericardial rub Respiratory: crackle to right base MS: No edema, thickened skin overlying. No pain to palpation.   Assessment & Plan  Linda [name] is a 29 y.o. female with a hx of SLE, HTN, ESRD on MD (M/W/F), calciphlaxis, HFmrEF, and anemia who was admitted 1/7 for AV fistula creation by vascular surgery. She was discharged then re-presented to the ED 1/14 for fistula complication [dehiscence]. She has now undergone left arm brachiobasilic fistula ligation with I&D and placement of a wound vac. No growth on wound culture.  Cardiology was consulted 1/15 for chest pain worsened by movement and improved by pain medication. CT imaging showed LLL consolidation, completed abx treatment for PNA. CT  imaging also showed a moderate pericardial effusion prompting repeat echocardiogram 1/18 which showed LVEF 40-45% with global hypokinesis and severe concentric LVH. GII DD. PASP 45.7 mmHg. Moderate focal pericardial effusion. It was suspected her chest pain was 2/2 to this focal pericardial effusion and would improve with HD. On 1/21 cardiology signed off with plan to repeat echo in 2-4 weeks.   Since this time patient was started on prednisone  1/27 for her chest pain. While her chest pain was initially improving with volume management, she developed worsening chest pain that prompted re-peat echocardiogram 1/28 which showed stable LV dysfunction but worsening pericardial effusion, now large though no evidence of cardiac tamponade. Cardiology was asked to re-engage.   Pericardial effusion History of pericardial effusion 10/2023 and at that time suspected 2/2 to uremia Currently admitted with worsening pericardial effusion despite aggressive volume management with HD. At this time she is hemodynamically stable, echo was also without evidence of tamponade.  She has received three doses of predisone 40 mg. Shared did not notice improvement in her chest discomfort.   I think we need to rule out lupus flair, interesting that the steroids did help with her leg pain, wonder if arthritis from an exacerbation is contributing. She did have a recent PNA that was treated with antibiotics.  Will need to  continue monitor closely and consider a pericardiocentesis.  Chronic systolic and diastolic heart failure Serial echo listed above.  On exam, appears mildly volume up despite 4.5L pulled yesterday Volume management per HD. On Lasix  80 mg daily and HD.   Continue coreg  25 mg BID Continue irbesartan  300 mg Renal function precludes further GDMT titration  Hypertension BP: 136/88 Followed in hypertension clinic outpatient.   Continue amlodipine  10 mg Continue Coreg  25 mg BID Continue clonidine  0.1  mg Continue irbesaratn 300 mg Continue lasix  80 mg [per nephrology]  Per primary SLE ESRD on HD Hyperkalemia Anemia Fistula complication- vascular following Calciphlaxis    For questions or updates, please contact [facility] Please consult [email or web address] for contact info under     Signed, [name], PA-C   Patient seen and examined, note reviewed with the signed Advanced Practice Provider. I personally reviewed laboratory data, imaging studies and relevant notes. I independently examined the patient and formulated the important aspects of the plan. I have personally discussed the plan with the patient and/or family. Comments or changes to the note/plan are indicated below.  Patient seen and examined at her bedside. I was able to speak with her while her aunt was on the phone. She reports joint pain.  We are seeing her for worsening pericardial effusion.   Large pericardial effusion  Chronic midrange heart failure EF 40-45% Hypertension  Elevated troponin suspect demand ischemia   The pericardial effusion seems to be worsening now large, mild symptoms but thankfully no evidence of cardiac tamponade. She will benefit from a pericardiocentesis.  We will plan for this tomorrow likely after hemodialysis. I do not expect that this will improve with her HD. I am concern that this may progress. I spoke to the patient she is in agreement.   She has been taking Lasix  80 mg daily.  She still makes urine.  She tells me that she does not believe that the Lasix  is helping as much as it used to.  Consider switching to torsemide but will defer to nephrology for this change.  We will continue current dose of amlodipine  10 mg daily, Coreg  25 mg twice daily, she is on clonidine  as well 0.1 mg daily as well as irbesartan .     [name] DO, MS [facility] Attending Cardiologist [facility]  [address] [place], [place] [address] [phone] Website:  [email or web address]   "

## 2024-09-29 ENCOUNTER — Inpatient Hospital Stay (HOSPITAL_COMMUNITY)

## 2024-09-29 ENCOUNTER — Encounter (HOSPITAL_COMMUNITY): Admission: EM | Payer: Self-pay | Source: Home / Self Care | Attending: Family Medicine

## 2024-09-29 DIAGNOSIS — M3212 Pericarditis in systemic lupus erythematosus: Secondary | ICD-10-CM | POA: Diagnosis not present

## 2024-09-29 DIAGNOSIS — I3139 Other pericardial effusion (noninflammatory): Secondary | ICD-10-CM | POA: Diagnosis not present

## 2024-09-29 DIAGNOSIS — I5020 Unspecified systolic (congestive) heart failure: Secondary | ICD-10-CM | POA: Diagnosis not present

## 2024-09-29 DIAGNOSIS — I10 Essential (primary) hypertension: Secondary | ICD-10-CM | POA: Diagnosis not present

## 2024-09-29 DIAGNOSIS — N186 End stage renal disease: Secondary | ICD-10-CM | POA: Diagnosis not present

## 2024-09-29 DIAGNOSIS — I5042 Chronic combined systolic (congestive) and diastolic (congestive) heart failure: Secondary | ICD-10-CM | POA: Diagnosis not present

## 2024-09-29 DIAGNOSIS — T82898A Other specified complication of vascular prosthetic devices, implants and grafts, initial encounter: Secondary | ICD-10-CM | POA: Diagnosis not present

## 2024-09-29 DIAGNOSIS — I309 Acute pericarditis, unspecified: Secondary | ICD-10-CM

## 2024-09-29 LAB — CBC
HCT: 26.7 % — ABNORMAL LOW (ref 36.0–46.0)
Hemoglobin: 8.3 g/dL — ABNORMAL LOW (ref 12.0–15.0)
MCH: 27.4 pg (ref 26.0–34.0)
MCHC: 31.1 g/dL (ref 30.0–36.0)
MCV: 88.1 fL (ref 80.0–100.0)
Platelets: 382 10*3/uL (ref 150–400)
RBC: 3.03 MIL/uL — ABNORMAL LOW (ref 3.87–5.11)
RDW: 21.2 % — ABNORMAL HIGH (ref 11.5–15.5)
WBC: 23.5 10*3/uL — ABNORMAL HIGH (ref 4.0–10.5)
nRBC: 0 % (ref 0.0–0.2)

## 2024-09-29 LAB — ECHOCARDIOGRAM LIMITED
Height: 66 in
Weight: 2462.1 [oz_av]

## 2024-09-29 LAB — RENAL FUNCTION PANEL
Albumin: 3.2 g/dL — ABNORMAL LOW (ref 3.5–5.0)
Anion gap: 17 — ABNORMAL HIGH (ref 5–15)
BUN: 48 mg/dL — ABNORMAL HIGH (ref 6–20)
CO2: 25 mmol/L (ref 22–32)
Calcium: 10.5 mg/dL — ABNORMAL HIGH (ref 8.9–10.3)
Chloride: 89 mmol/L — ABNORMAL LOW (ref 98–111)
Creatinine, Ser: 5.98 mg/dL — ABNORMAL HIGH (ref 0.44–1.00)
GFR, Estimated: 9 mL/min — ABNORMAL LOW
Glucose, Bld: 110 mg/dL — ABNORMAL HIGH (ref 70–99)
Phosphorus: 5.7 mg/dL — ABNORMAL HIGH (ref 2.5–4.6)
Potassium: 4.5 mmol/L (ref 3.5–5.1)
Sodium: 132 mmol/L — ABNORMAL LOW (ref 135–145)

## 2024-09-29 LAB — C-REACTIVE PROTEIN: CRP: 12.3 mg/dL — ABNORMAL HIGH

## 2024-09-29 MED ORDER — FENTANYL CITRATE (PF) 100 MCG/2ML IJ SOLN
INTRAMUSCULAR | Status: DC | PRN
  Administered 2024-09-29: 25 ug via INTRAVENOUS

## 2024-09-29 MED ORDER — HEPARIN SODIUM (PORCINE) 1000 UNIT/ML IJ SOLN
INTRAMUSCULAR | Status: AC
  Filled 2024-09-29: qty 5

## 2024-09-29 MED ORDER — FENTANYL CITRATE (PF) 100 MCG/2ML IJ SOLN
INTRAMUSCULAR | Status: AC
  Filled 2024-09-29: qty 2

## 2024-09-29 MED ORDER — DICLOFENAC SODIUM 1 % EX GEL
2.0000 g | Freq: Four times a day (QID) | CUTANEOUS | Status: AC
  Administered 2024-09-29 – 2024-10-06 (×12): 2 g via TOPICAL
  Filled 2024-09-29 (×4): qty 100

## 2024-09-29 MED ORDER — LIDOCAINE HCL (PF) 1 % IJ SOLN
INTRAMUSCULAR | Status: AC
  Filled 2024-09-29: qty 30

## 2024-09-29 MED ORDER — MIDAZOLAM HCL 2 MG/2ML IJ SOLN
INTRAMUSCULAR | Status: AC
  Filled 2024-09-29: qty 2

## 2024-09-29 MED ORDER — PREDNISONE 20 MG PO TABS
40.0000 mg | ORAL_TABLET | Freq: Every day | ORAL | Status: AC
  Administered 2024-09-29 – 2024-10-06 (×8): 40 mg via ORAL
  Filled 2024-09-29 (×8): qty 2

## 2024-09-29 MED ORDER — MIDAZOLAM HCL (PF) 2 MG/2ML IJ SOLN
INTRAMUSCULAR | Status: DC | PRN
  Administered 2024-09-29: 1 mg via INTRAVENOUS

## 2024-09-29 MED ORDER — HYDROMORPHONE HCL 1 MG/ML IJ SOLN
INTRAMUSCULAR | Status: AC
  Filled 2024-09-29: qty 1

## 2024-09-29 NOTE — Interval H&P Note (Signed)
 History and Physical Interval Note:  09/29/2024 12:43 PM  Linda [name]  has presented today for surgery, with the diagnosis of pericardiocentesis.  The various methods of treatment have been discussed with the patient and family. After consideration of risks, benefits and other options for treatment, the patient has consented to  Procedures: PERICARDIOCENTESIS (N/A) as a surgical intervention.  The patient's history has been reviewed, patient examined, no change in status, stable for surgery.  I have reviewed the patient's chart and labs.  Questions were answered to the patient's satisfaction.     Emerly Prak K Caelum Federici

## 2024-09-29 NOTE — Progress Notes (Addendum)
 " [facility] Progress Note   Subjective:   Reports not feeling well today, still having pain under her R ribs. Denies palpitations, dizziness, nausea. Reports SOB is unchanged.  Objective Vitals:   09/28/24 1637 09/28/24 1947 09/29/24 0522 09/29/24 0802  BP: (!) 142/95 (!) 130/94 (!) 141/95 (!) 141/100  Pulse: 69 65 69 62  Resp: 18 18 18  (!) 23  Temp: 97.9 F (36.6 C) 98.1 F (36.7 C) 97.7 F (36.5 C)   TempSrc: Oral Oral Oral   SpO2: 90% 93% 92% 100%  Weight:      Height:       Physical Exam General: alert female in NAD Lungs: respirations unlabored on RA Abdomen: non-distended Extremities: no LE edema, L arm edema improved Dialysis Access: [facility]  Additional Objective Labs: Basic Metabolic Panel: Recent Labs  Lab 09/24/24 0627 09/25/24 0303 09/26/24 1130 09/27/24 0830 09/29/24 0508  NA 129*   < > 129* 132* 132*  K 4.8   < > 5.4* 5.4* 4.5  CL 89*   < > 89* 91* 89*  CO2 25   < > 24 25 25   GLUCOSE 85   < > 91 116* 110*  BUN 41*   < > 38* 48* 48*  CREATININE 4.74*   < > 5.05* 6.09* 5.98*  CALCIUM  9.8   < > 10.1 10.4* 10.5*  PHOS 5.8*  --   --  7.3* 5.7*   < > = values in this interval not displayed.   Liver Function Tests: Recent Labs  Lab 09/27/24 0830 09/29/24 0508  ALBUMIN  3.2* 3.2*   No results for input(s): LIPASE, AMYLASE in the last 168 hours. CBC: Recent Labs  Lab 09/24/24 0627 09/25/24 0303 09/26/24 1130 09/27/24 0830 09/29/24 0508  WBC 7.9 8.4 11.4* 17.8* 23.5*  HGB 7.1* 7.4* 8.1* 7.5* 8.3*  HCT 23.7* 24.7* 26.3* 23.6* 26.7*  MCV 91.2 90.1 88.3 85.8 88.1  PLT 331 348 348 315 382   Blood Culture    Component Value Date/Time   SDES BLOOD RIGHT HAND 09/16/2024 1326   SPECREQUEST  09/16/2024 1326    BOTTLES DRAWN AEROBIC ONLY Blood Culture results may not be optimal due to an inadequate volume of blood received in culture bottles   CULT  09/16/2024 1326    NO GROWTH 5 DAYS Performed at [facility], [address] [place], [place] [address]    REPTSTATUS 09/21/2024 FINAL 09/16/2024 1326    Cardiac Enzymes: No results for input(s): CKTOTAL, CKMB, CKMBINDEX, TROPONINI in the last 168 hours. CBG: No results for input(s): GLUCAP in the last 168 hours. Iron  Studies: No results for input(s): IRON , TIBC, TRANSFERRIN, FERRITIN in the last 72 hours. @lablastinr3 @ Studies/Results: DG CHEST PORT 1 VIEW Result Date: 09/28/2024 CLINICAL DATA:  Dyspnea on exertion. EXAM: PORTABLE CHEST 1 VIEW COMPARISON:  September 26, 2024 FINDINGS: There is stable right-sided venous catheter positioning. The cardiac silhouette is markedly enlarged and unchanged in size. Stable moderate severity atelectasis and/or infiltrate is seen within the retrocardiac region of the left lung base. No pleural effusion or pneumothorax is identified. A radiopaque vascular stent is seen within the medial aspect of the left upper extremity. The visualized skeletal structures are unremarkable. IMPRESSION: Stable cardiomegaly with moderate severity left basilar atelectasis and/or infiltrate. Electronically Signed   By: Suzen Dials M.D.   On: 09/28/2024 18:29   ECHOCARDIOGRAM LIMITED Result Date: 09/27/2024    ECHOCARDIOGRAM LIMITED REPORT   Patient Name:   Linda [name] Span  Date of Exam: 09/27/2024 Medical Rec #:  [id number]             Height:       66.0 in Accession #:    [id number]            Weight:       153.9 lb Date of Birth:  1995-11-24             BSA:          1.789 m Patient Age:    28 years              BP:           125/91 mmHg Patient Gender: F                     HR:           79 bpm. Exam Location:  Inpatient Procedure: Limited Echo, Cardiac Doppler and Color Doppler (Both Spectral and            Color Flow Doppler were utilized during procedure). Indications:    Pericardial effusion  History:        Patient has prior history of Echocardiogram examinations, most                 recent 09/17/2024. CHF, Abnormal ECG,  Arrythmias:Tachycardia,                 Signs/Symptoms:Shortness of Breath; Risk Factors:Hypertension                 and Current Smoker.  Sonographer:    Juliene Rucks Referring Phys: [id number] ELVAN SOR IMPRESSIONS  1. Left ventricular ejection fraction, by estimation, is 40 to 45%. The left ventricle has mildly decreased function. The left ventricle demonstrates global hypokinesis. There is severe concentric left ventricular hypertrophy. Left ventricular diastolic  parameters are consistent with Grade II diastolic dysfunction (pseudonormalization).  2. Right ventricular systolic function is moderately reduced. The right ventricular size is mildly enlarged.  3. Left atrial size was mild to moderately dilated.  4. Large pericardial effusion. The pericardial effusion is circumferential. There is no evidence of cardiac tamponade.  5. Trivial mitral valve regurgitation.  6. The aortic valve is tricuspid. There is mild calcification of the aortic valve. Conclusion(s)/Recommendation(s): The pericardial effusion has increased substantially since 09/17/24. It is now large. IVC size and mitral inflow pattern not assessed on this study but there is no evidence of RA or RV colaapse to suggest tamponade. FINDINGS  Left Ventricle: Left ventricular ejection fraction, by estimation, is 40 to 45%. The left ventricle has mildly decreased function. The left ventricle demonstrates global hypokinesis. There is severe concentric left ventricular hypertrophy. Left ventricular diastolic parameters are consistent with Grade II diastolic dysfunction (pseudonormalization). Right Ventricle: The right ventricular size is mildly enlarged. Right ventricular systolic function is moderately reduced. Left Atrium: Left atrial size was mild to moderately dilated. Pericardium: A large pericardial effusion is present. The pericardial effusion is circumferential. There is no evidence of cardiac tamponade. Mitral Valve: Trivial mitral valve  regurgitation. Aortic Valve: The aortic valve is tricuspid. There is mild calcification of the aortic valve. Venous: The inferior vena cava was not well visualized. LEFT VENTRICLE PLAX 2D LVIDd:         4.60 cm   Diastology LVIDs:         3.50 cm   LV e' medial:    6.74 cm/s LV PW:  1.40 cm   LV E/e' medial:  10.9 LV IVS:        1.60 cm   LV e' lateral:   6.31 cm/s LVOT diam:     2.10 cm   LV E/e' lateral: 11.6 LVOT Area:     3.46 cm  RIGHT VENTRICLE RV Basal diam:  3.30 cm RV Mid diam:    3.20 cm LEFT ATRIUM             Index        RIGHT ATRIUM           Index LA diam:        4.10 cm 2.29 cm/m   RA Area:     17.20 cm LA Vol (A2C):   85.0 ml 47.51 ml/m  RA Volume:   41.90 ml  23.42 ml/m LA Vol (A4C):   46.8 ml 26.16 ml/m LA Biplane Vol: 67.4 ml 37.68 ml/m   AORTA Ao Root diam: 3.40 cm MITRAL VALVE MV Area (PHT): 3.37 cm    SHUNTS MV Decel Time: 225 msec    Systemic Diam: 2.10 cm MV E velocity: 73.40 cm/s MV A velocity: 79.70 cm/s MV E/A ratio:  0.92 Toribio Fuel MD Electronically signed by Toribio Fuel MD Signature Date/Time: 09/27/2024/9:10:49 PM    Final    Medications:  sodium chloride       amLODipine   10 mg Oral QHS   carvedilol   25 mg Oral BID WC   Chlorhexidine  Gluconate Cloth  6 each Topical Q0600   Chlorhexidine  Gluconate Cloth  6 each Topical Q0600   cloNIDine   0.1 mg Oral Daily   darbepoetin (ARANESP ) injection - DIALYSIS  200 mcg Subcutaneous Q Sun-1800   doxercalciferol   5 mcg Intravenous Q M,W,F-HD   furosemide   80 mg Oral Daily   irbesartan   300 mg Oral Daily   pantoprazole   40 mg Oral Daily   sevelamer  carbonate  2.4 g Oral TID WC   sodium chloride  flush  3 mL Intravenous Q12H    Dialysis Orders: MWF - [facility] 3.5hrs, BFR 400, DFR 500,  EDW 68.4kg, 2K/ 2Ca, Heparin  3000 units bolus - Mircera 225 mcg q2wks - last 09/11/24 - Hectorol  5mcg IV qHD - last 09/13/24 - Sensipar  90mg  with HD - last 09/13/24  Assessment/Plan: AVF malfunction - VVS  following. Hx multiple revisions in the past, last 1/7. Scheduled ligation aborted 2nd pt c/o CP. S/p ligation 1/16. S/p I&D with wound vac placement 1/20. On Vanc/Cefepime .  Chest Pain - Troponins elevated initially, cardiology felt not anginal. CTA with volume overload on 1/17. CXR yesterday with enlarging pericardial effusion, cardiology planning paricardiocentesis today ESRD -  on HD MWF. Remains volume overload but improved. Reinforced fluid restrictions.  Hypertension/volume  - BP meds resumed, UF as tolerated with HD as above Anemia of CKD - Hgb 8.3, on high dose ESA Secondary Hyperparathyroidism -  Ca ok, Phos high - restarted binders (sevelamer ). Stopped hectorol .  Nutrition - Renal diet with fluid restriction GERD - Had been getting sucralfate , stopped. Continue PPI  Linda Collet, PA-C 09/29/2024, 9:09 AM  [facility] Pager: [phone]  I have independently taken a history, reviewed the chart and examined the patient. This patient encounter includes evaluation, adjustment of therapies in at least one of the key components with review of  extender Samantha Collin's impression and recommendations.      Going for pericardiocentesis today.  Seen on HD today RIJ TC 4L net UF as tolerated 137/110  3K bath Concerned about the pain migrating.  MELIA LYNWOOD ORN, MD 09/29/2024, 10:41 AM   "

## 2024-09-29 NOTE — Plan of Care (Signed)
   Problem: Education: Goal: Knowledge of General Education information will improve Description: Including pain rating scale, medication(s)/side effects and non-pharmacologic comfort measures Outcome: Completed/Met

## 2024-09-29 NOTE — Progress Notes (Signed)
 Interventional Cardiology Note:  Patient referred for pericardiocentesis.  She was brought down to the cardiac catheterization laboratory and echocardiographic assessment demonstrated a very small amount of fluid anteriorly.  She does have more fluid posteriorly.  I reviewed with Dr. Barnetta.  The patient had received dialysis today and was treated with steroids due to her history of lupus.  We have decided to defer pericardiocentesis.  I spoke to the patient's family member Jonette by phone.  The patient will be transferred back to the [facility]M floor.

## 2024-09-29 NOTE — Progress Notes (Signed)
 "  Rounding Note   Patient Name: Linda [name] Date of Encounter: 09/29/2024  [facility] Cardiologist: Madonna Large, DO   Subjective BP 135/107. Reports pleuritic chest pain.    Scheduled Meds:  amLODipine   10 mg Oral QHS   carvedilol   25 mg Oral BID WC   Chlorhexidine  Gluconate Cloth  6 each Topical Q0600   Chlorhexidine  Gluconate Cloth  6 each Topical Q0600   cloNIDine   0.1 mg Oral Daily   darbepoetin (ARANESP ) injection - DIALYSIS  200 mcg Subcutaneous Q Sun-1800   diclofenac  Sodium  2 g Topical QID   furosemide   80 mg Oral Daily   irbesartan   300 mg Oral Daily   pantoprazole   40 mg Oral Daily   sevelamer  carbonate  2.4 g Oral TID WC   sodium chloride  flush  3 mL Intravenous Q12H   Continuous Infusions:  sodium chloride      PRN Meds: sodium chloride , hydrALAZINE , HYDROmorphone  (DILAUDID ) injection, methocarbamol , naLOXone  (NARCAN )  injection, nitroGLYCERIN , oxyCODONE , sodium chloride  flush   Vital Signs  Vitals:   09/29/24 1030 09/29/24 1139 09/29/24 1142 09/29/24 1220  BP:  (!) 124/98 124/88 (!) 135/107  Pulse: 67 72 72 70  Resp: 20 (!) 28 20 18   Temp:  98.3 F (36.8 C) 98.3 F (36.8 C) 98.8 F (37.1 C)  TempSrc:    Oral  SpO2: 92% 97% 94% 90%  Weight:      Height:        Intake/Output Summary (Last 24 hours) at 09/29/2024 1253 Last data filed at 09/29/2024 1139 Gross per 24 hour  Intake 240 ml  Output 3900 ml  Net -3660 ml      09/27/2024   12:51 PM 09/27/2024    8:29 AM 09/25/2024    1:31 PM  Last 3 Weights  Weight (lbs) 153 lb 14.1 oz 163 lb 12.8 oz 162 lb 14.7 oz   Weight (kg) 69.8 kg 74.3 kg 73.9 kg      Significant value      Telemetry Not on tele - Personally Reviewed  ECG  No new ECG - Personally Reviewed  Physical Exam  GEN: Appears uncomfortable  Neck: No JVD Cardiac: RRR, no murmurs, rubs, or gallops.  Respiratory: Clear to auscultation bilaterally. GI: Soft, nontender, non-distended  MS: No edema; No  deformity. Neuro:  Nonfocal  Psych: Normal affect   Labs High Sensitivity Troponin:  No results for input(s): TROPONINIHS in the last 720 hours.  Recent Labs  Lab 09/14/24 0734 09/14/24 0937  TRNPT 118* 112*       Chemistry Recent Labs  Lab 09/24/24 0627 09/25/24 0303 09/26/24 1130 09/27/24 0830 09/29/24 0508  NA 129*   < > 129* 132* 132*  K 4.8   < > 5.4* 5.4* 4.5  CL 89*   < > 89* 91* 89*  CO2 25   < > 24 25 25   GLUCOSE 85   < > 91 116* 110*  BUN 41*   < > 38* 48* 48*  CREATININE 4.74*   < > 5.05* 6.09* 5.98*  CALCIUM  9.8   < > 10.1 10.4* 10.5*  MG 2.0  --   --   --   --   ALBUMIN   --   --   --  3.2* 3.2*  GFRNONAA 12*   < > 11* 9* 9*  ANIONGAP 15   < > 17* 17* 17*   < > = values in this interval not displayed.    Lipids  No results for input(s): CHOL, TRIG, HDL, LABVLDL, LDLCALC, CHOLHDL in the last 168 hours.  Hematology Recent Labs  Lab 09/26/24 1130 09/27/24 0830 09/29/24 0508  WBC 11.4* 17.8* 23.5*  RBC 2.98* 2.75* 3.03*  HGB 8.1* 7.5* 8.3*  HCT 26.3* 23.6* 26.7*  MCV 88.3 85.8 88.1  MCH 27.2 27.3 27.4  MCHC 30.8 31.8 31.1  RDW 22.0* 21.7* 21.2*  PLT 348 315 382   Thyroid No results for input(s): TSH, FREET4 in the last 168 hours.  BNPNo results for input(s): BNP, PROBNP in the last 168 hours.  DDimer No results for input(s): DDIMER in the last 168 hours.   Radiology  DG CHEST PORT 1 VIEW Result Date: 09/28/2024 CLINICAL DATA:  Dyspnea on exertion. EXAM: PORTABLE CHEST 1 VIEW COMPARISON:  September 26, 2024 FINDINGS: There is stable right-sided venous catheter positioning. The cardiac silhouette is markedly enlarged and unchanged in size. Stable moderate severity atelectasis and/or infiltrate is seen within the retrocardiac region of the left lung base. No pleural effusion or pneumothorax is identified. A radiopaque vascular stent is seen within the medial aspect of the left upper extremity. The visualized skeletal structures are  unremarkable. IMPRESSION: Stable cardiomegaly with moderate severity left basilar atelectasis and/or infiltrate. Electronically Signed   By: Suzen Dials M.D.   On: 09/28/2024 18:29   ECHOCARDIOGRAM LIMITED Result Date: 09/27/2024    ECHOCARDIOGRAM LIMITED REPORT   Patient Name:   Linda [name] Date of Exam: 09/27/2024 Medical Rec #:  [id number]             Height:       66.0 in Accession #:    [id number]            Weight:       153.9 lb Date of Birth:  07-15-1996             BSA:          1.789 m Patient Age:    28 years              BP:           125/91 mmHg Patient Gender: F                     HR:           79 bpm. Exam Location:  Inpatient Procedure: Limited Echo, Cardiac Doppler and Color Doppler (Both Spectral and            Color Flow Doppler were utilized during procedure). Indications:    Pericardial effusion  History:        Patient has prior history of Echocardiogram examinations, most                 recent 09/17/2024. CHF, Abnormal ECG, Arrythmias:Tachycardia,                 Signs/Symptoms:Shortness of Breath; Risk Factors:Hypertension                 and Current Smoker.  Sonographer:    Juliene Rucks Referring Phys: [id number] ELVAN SOR IMPRESSIONS  1. Left ventricular ejection fraction, by estimation, is 40 to 45%. The left ventricle has mildly decreased function. The left ventricle demonstrates global hypokinesis. There is severe concentric left ventricular hypertrophy. Left ventricular diastolic  parameters are consistent with Grade II diastolic dysfunction (pseudonormalization).  2. Right ventricular systolic function is moderately reduced. The right ventricular size is mildly enlarged.  3. Left atrial size  was mild to moderately dilated.  4. Large pericardial effusion. The pericardial effusion is circumferential. There is no evidence of cardiac tamponade.  5. Trivial mitral valve regurgitation.  6. The aortic valve is tricuspid. There is mild calcification of the aortic valve.  Conclusion(s)/Recommendation(s): The pericardial effusion has increased substantially since 09/17/24. It is now large. IVC size and mitral inflow pattern not assessed on this study but there is no evidence of RA or RV colaapse to suggest tamponade. FINDINGS  Left Ventricle: Left ventricular ejection fraction, by estimation, is 40 to 45%. The left ventricle has mildly decreased function. The left ventricle demonstrates global hypokinesis. There is severe concentric left ventricular hypertrophy. Left ventricular diastolic parameters are consistent with Grade II diastolic dysfunction (pseudonormalization). Right Ventricle: The right ventricular size is mildly enlarged. Right ventricular systolic function is moderately reduced. Left Atrium: Left atrial size was mild to moderately dilated. Pericardium: A large pericardial effusion is present. The pericardial effusion is circumferential. There is no evidence of cardiac tamponade. Mitral Valve: Trivial mitral valve regurgitation. Aortic Valve: The aortic valve is tricuspid. There is mild calcification of the aortic valve. Venous: The inferior vena cava was not well visualized. LEFT VENTRICLE PLAX 2D LVIDd:         4.60 cm   Diastology LVIDs:         3.50 cm   LV e' medial:    6.74 cm/s LV PW:         1.40 cm   LV E/e' medial:  10.9 LV IVS:        1.60 cm   LV e' lateral:   6.31 cm/s LVOT diam:     2.10 cm   LV E/e' lateral: 11.6 LVOT Area:     3.46 cm  RIGHT VENTRICLE RV Basal diam:  3.30 cm RV Mid diam:    3.20 cm LEFT ATRIUM             Index        RIGHT ATRIUM           Index LA diam:        4.10 cm 2.29 cm/m   RA Area:     17.20 cm LA Vol (A2C):   85.0 ml 47.51 ml/m  RA Volume:   41.90 ml  23.42 ml/m LA Vol (A4C):   46.8 ml 26.16 ml/m LA Biplane Vol: 67.4 ml 37.68 ml/m   AORTA Ao Root diam: 3.40 cm MITRAL VALVE MV Area (PHT): 3.37 cm    SHUNTS MV Decel Time: 225 msec    Systemic Diam: 2.10 cm MV E velocity: 73.40 cm/s MV A velocity: 79.70 cm/s MV E/A ratio:   0.92 Toribio Fuel MD Electronically signed by Toribio Fuel MD Signature Date/Time: 09/27/2024/9:10:49 PM    Final     Cardiac Studies   Patient Profile   29 y.o. female with history of lupus, ESRD on HD, chronic systolic heart failure who we are consulted for evaluation of pericardial effusion.  Assessment & Plan   Pericardial effusion/pericarditis: Moderate on echocardiogram 1/18, repeat echocardiogram 1/28 showed large effusion.  No evidence of tamponade.   -Plan pericardiocentesis today -She is reporting pleuritic/positional chest pain consistent with pericarditis.  Likely due to lupus, but uremic pericarditis on ddx, though denies any missed HD. NO NSAIDs or colchicine  given ESRD.  She was started on prednisone  40 mg daily but only for 3 days, would recommend restarting and plan slow taper, likely decrease by 5mg  weekly until off.  Will check  ESR, CRP, C3/4, anti-dsDNA  Chronic systolic heart failure: EF 40 to 45% on echo.  Volume management per HD.  Continue Coreg , irbesartan   For questions or updates, please contact [facility] Please consult [email or web address] for contact info under     Signed, Lonni LITTIE Nanas, MD  09/29/2024, 12:53 PM    "

## 2024-09-29 NOTE — Progress Notes (Signed)
" °   09/29/24 1139  Vitals  Temp 98.3 F (36.8 C)  BP (!) 124/98  Pulse Rate 72  Resp (!) 28  Oxygen Therapy  SpO2 97 %  O2 Device Nasal Cannula  Patient Activity (if Appropriate) In bed  Pulse Oximetry Type Continuous  Oximetry Probe Site Changed No  During Treatment Monitoring  Dialysate Potassium Concentration 3  Dialysate Calcium  Concentration 2.5  HD Safety Checks Performed Yes  Intra-Hemodialysis Comments Tolerated well;Tx completed  Post Treatment  Dialyzer Clearance Heavily streaked  Hemodialysis Intake (mL) 0 mL  Liters Processed 82.1  Fluid Removed (mL) 3900 mL    "

## 2024-09-29 NOTE — Progress Notes (Signed)
 "  PROGRESS NOTE    Linda [name]  FMW:[id number] DOB: November 11, 1995 DOA: 09/13/2024 PCP: [name], NP   Brief Narrative: Linda [name] is a 29 y.o. female with a history of hypertension, G6PD deficiency, CHF, lupus, ESRD on HD.  Patient presented secondary to left arm swelling in setting of recent AV fistula surgery and recent wound dehiscence. Vascular surgery consulted and performed AV fistula washout with wound vac placement.   Assessment and Plan:  Left upper extremity edema In setting of recent AV fistula surgery. Resultant dehiscence of wound. Vascular surgery consulted and performed a left arm brachiobasilic fistula ligation with I&D and placement of a wound vac. No growth on wound culture. - Vascular surgery recommendations: pending today  Pericardial effusion Noted on Transthoracic Echocardiogram. No clinical evidence of tamponade. Repeat limited echo performed on 1/28 and is significant for a significant enlargement of the effusion. Cardiology consulted and plan for a pericardiocentesis on 1/30.  Chest pain Patient with troponin of 118 with negative delta of 112. Echo with slightly improved LVEF of 40-45% with global hypokinesis. Cardiology was consulted for workup/management. Symptoms appeared to improved with ongoing treatment for volume overload. Prednisone  started for concern for musculoskeletal component. Patient started on prednisone  for likely musculoskeletal etiology. Symptoms continue to be intermittent. Continued symptoms. - Will see if pericardiocentesis improves symptoms - If no improvement with pericardiocentesis, will consider CT chest imaging  Dyspnea on exertion Concern this could be related to patient's pericardial effusion vs fluid overload. Last hemodialysis was 1/28. Chest x-ray confirms known cardiomegaly. No hypoxia with ambulation. - Assess symptoms after pericardiocentesis   ESRD on HD MWF schedule. Nephrology consulted for  ongoing hemodialysis needs.  Hyperkalemia Patient given Lokelma . Continued management per nephrology with hemodialysis.  Leukocytosis Worsening WBC from 1/27. Appears to correlate with initiation of prednisone . Up to 23,500 today; no fevers. - Repeat CBC with differential in AM  Acute on chronic HFrEF Managed via hemodialysis.  GERD - Continue Protonix  and sucralfate   Primary hypertension - Continue amlodipine , carvedilol , clonidine  and Lasix   Hyponatremia Secondary to volume overload. Management per HD.  Vitamin D deficiency Patient started on doxercalciferol  with HD.   DVT prophylaxis: SCDs Code Status:   Code Status: Full Code Family Communication: None at bedside Disposition Plan: Discharge pending ongoing vascular surgery and cardiology recommendations   Consultants:  Vascular surgery Cardiology  Procedures:  Transthoracic Echocardiogram  Antimicrobials: Cefepime  Vancomycin     Subjective: Pain is significant and worse with inspiration. Mostly on her left lateral chest area.  Objective: BP 131/87   Pulse 63   Temp 97.7 F (36.5 C) (Oral)   Resp 18   Ht 5' 6 (1.676 m)   Wt 69.8 kg   LMP  (LMP Unknown)   SpO2 97%   BMI 24.84 kg/m   Examination:  General exam: Appears anxious. Respiratory system: Rales at bases, ?rub. Respiratory effort normal. Cardiovascular system: S1 & S2 heard, RRR.  Gastrointestinal system: Abdomen is nondistended, soft and nontender. Normal bowel sounds heard. Central nervous system: Alert and oriented. No focal neurological deficits. Musculoskeletal: BLE edema. No calf tenderness Psychiatry: Judgement and insight appear normal. Mood & affect appropriate.    Data Reviewed: I have personally reviewed following labs and imaging studies  CBC Lab Results  Component Value Date   WBC 23.5 (H) 09/29/2024   RBC 3.03 (L) 09/29/2024   HGB 8.3 (L) 09/29/2024   HCT 26.7 (L) 09/29/2024   MCV 88.1 09/29/2024   MCH 27.4  09/29/2024  PLT 382 09/29/2024   MCHC 31.1 09/29/2024   RDW 21.2 (H) 09/29/2024   LYMPHSABS 2.0 09/13/2024   MONOABS 1.2 (H) 09/13/2024   EOSABS 0.1 09/13/2024   BASOSABS 0.0 09/13/2024     Last metabolic panel Lab Results  Component Value Date   NA 132 (L) 09/29/2024   K 4.5 09/29/2024   CL 89 (L) 09/29/2024   CO2 25 09/29/2024   BUN 48 (H) 09/29/2024   CREATININE 5.98 (H) 09/29/2024   GLUCOSE 110 (H) 09/29/2024   GFRNONAA 9 (L) 09/29/2024   GFRAA 19 (L) 03/27/2019   CALCIUM  10.5 (H) 09/29/2024   PHOS 5.7 (H) 09/29/2024   PROT 7.9 09/13/2024   ALBUMIN  3.2 (L) 09/29/2024   BILITOT 1.1 09/13/2024   ALKPHOS 174 09/13/2024   AST 26 09/13/2024   ALT 7 09/13/2024   ANIONGAP 17 (H) 09/29/2024    GFR: Estimated Creatinine Clearance: 13 mL/min (A) (by C-G formula based on SCr of 5.98 mg/dL (H)).  No results found for this or any previous visit (from the past 240 hours).    Radiology Studies: DG CHEST PORT 1 VIEW Result Date: 09/28/2024 CLINICAL DATA:  Dyspnea on exertion. EXAM: PORTABLE CHEST 1 VIEW COMPARISON:  September 26, 2024 FINDINGS: There is stable right-sided venous catheter positioning. The cardiac silhouette is markedly enlarged and unchanged in size. Stable moderate severity atelectasis and/or infiltrate is seen within the retrocardiac region of the left lung base. No pleural effusion or pneumothorax is identified. A radiopaque vascular stent is seen within the medial aspect of the left upper extremity. The visualized skeletal structures are unremarkable. IMPRESSION: Stable cardiomegaly with moderate severity left basilar atelectasis and/or infiltrate. Electronically Signed   By: Suzen Dials M.D.   On: 09/28/2024 18:29   ECHOCARDIOGRAM LIMITED Result Date: 09/27/2024    ECHOCARDIOGRAM LIMITED REPORT   Patient Name:   Linda [name] Date of Exam: 09/27/2024 Medical Rec #:  [id number]             Height:       66.0 in Accession #:    [id number]             Weight:       153.9 lb Date of Birth:  16-Nov-1995             BSA:          1.789 m Patient Age:    28 years              BP:           125/91 mmHg Patient Gender: F                     HR:           79 bpm. Exam Location:  Inpatient Procedure: Limited Echo, Cardiac Doppler and Color Doppler (Both Spectral and            Color Flow Doppler were utilized during procedure). Indications:    Pericardial effusion  History:        Patient has prior history of Echocardiogram examinations, most                 recent 09/17/2024. CHF, Abnormal ECG, Arrythmias:Tachycardia,                 Signs/Symptoms:Shortness of Breath; Risk Factors:Hypertension                 and Current Smoker.  Sonographer:  Juliene Rucks Referring Phys: [id number] ELVAN SOR IMPRESSIONS  1. Left ventricular ejection fraction, by estimation, is 40 to 45%. The left ventricle has mildly decreased function. The left ventricle demonstrates global hypokinesis. There is severe concentric left ventricular hypertrophy. Left ventricular diastolic  parameters are consistent with Grade II diastolic dysfunction (pseudonormalization).  2. Right ventricular systolic function is moderately reduced. The right ventricular size is mildly enlarged.  3. Left atrial size was mild to moderately dilated.  4. Large pericardial effusion. The pericardial effusion is circumferential. There is no evidence of cardiac tamponade.  5. Trivial mitral valve regurgitation.  6. The aortic valve is tricuspid. There is mild calcification of the aortic valve. Conclusion(s)/Recommendation(s): The pericardial effusion has increased substantially since 09/17/24. It is now large. IVC size and mitral inflow pattern not assessed on this study but there is no evidence of RA or RV colaapse to suggest tamponade. FINDINGS  Left Ventricle: Left ventricular ejection fraction, by estimation, is 40 to 45%. The left ventricle has mildly decreased function. The left ventricle demonstrates global  hypokinesis. There is severe concentric left ventricular hypertrophy. Left ventricular diastolic parameters are consistent with Grade II diastolic dysfunction (pseudonormalization). Right Ventricle: The right ventricular size is mildly enlarged. Right ventricular systolic function is moderately reduced. Left Atrium: Left atrial size was mild to moderately dilated. Pericardium: A large pericardial effusion is present. The pericardial effusion is circumferential. There is no evidence of cardiac tamponade. Mitral Valve: Trivial mitral valve regurgitation. Aortic Valve: The aortic valve is tricuspid. There is mild calcification of the aortic valve. Venous: The inferior vena cava was not well visualized. LEFT VENTRICLE PLAX 2D LVIDd:         4.60 cm   Diastology LVIDs:         3.50 cm   LV e' medial:    6.74 cm/s LV PW:         1.40 cm   LV E/e' medial:  10.9 LV IVS:        1.60 cm   LV e' lateral:   6.31 cm/s LVOT diam:     2.10 cm   LV E/e' lateral: 11.6 LVOT Area:     3.46 cm  RIGHT VENTRICLE RV Basal diam:  3.30 cm RV Mid diam:    3.20 cm LEFT ATRIUM             Index        RIGHT ATRIUM           Index LA diam:        4.10 cm 2.29 cm/m   RA Area:     17.20 cm LA Vol (A2C):   85.0 ml 47.51 ml/m  RA Volume:   41.90 ml  23.42 ml/m LA Vol (A4C):   46.8 ml 26.16 ml/m LA Biplane Vol: 67.4 ml 37.68 ml/m   AORTA Ao Root diam: 3.40 cm MITRAL VALVE MV Area (PHT): 3.37 cm    SHUNTS MV Decel Time: 225 msec    Systemic Diam: 2.10 cm MV E velocity: 73.40 cm/s MV A velocity: 79.70 cm/s MV E/A ratio:  0.92 Toribio Fuel MD Electronically signed by Toribio Fuel MD Signature Date/Time: 09/27/2024/9:10:49 PM    Final       LOS: 15 days    Elgin Lam, MD [facility] 09/29/2024, 10:50 AM   If 7PM-7AM, please contact night-coverage [email or web address]  "

## 2024-09-29 NOTE — Plan of Care (Signed)
   Problem: Health Behavior/Discharge Planning: Goal: Ability to manage health-related needs will improve Outcome: Progressing

## 2024-09-29 NOTE — Consult Note (Addendum)
"  °  CLINICAL SUPPORT TEAM - WOUND OSTOMY AND CONTINENCE TEAM  CONSULTATION SERVICES   WOC Nurse-Inpatient Note   WOC Nurse wound follow up Pt was medicated for pain prior to the procedure and was apprehensive and tearful before dressing change started.  Provided with choice of twice weekly regular Vac changes or once weekly Peel and place Vac dressing, which she selected.   Pt slowly removed previous Vac dressing, using adhesive remover and saline, then I applied one piece small peel and place Vac dressing.  Full thickness post-op wound to left arm is decreasing in size and depth, 2X2.5X.3cm, red-yellow, sutures visible to inner wound, small amt pink drainage in the cannister.  Applied ace wrap over site to secure.     WOC team will plan for dressing change again next Fri if patient is still in the hospital at that time.   Thank-you,  Stephane Fought MSN, RN, CWOCN, CWCN-AP, CNS Contact Mon-Fri 0700-1500: [phone]  "

## 2024-09-29 NOTE — Progress Notes (Signed)
" °  Echocardiogram 2D Echocardiogram has been performed.  Linda [name] 09/29/2024, 4:31 PM "

## 2024-09-29 NOTE — Interval H&P Note (Signed)
 History and Physical Interval Note:  09/29/2024 1:08 PM  Linda [name]  has presented today for surgery, with the diagnosis of pericardiocentesis.  The various methods of treatment have been discussed with the patient and family. After consideration of risks, benefits and other options for treatment, the patient has consented to  Procedures: PERICARDIOCENTESIS (N/A) as a surgical intervention.  The patient's history has been reviewed, patient examined, no change in status, stable for surgery.  I have reviewed the patient's chart and labs.  Questions were answered to the patient's satisfaction.     Leatha Rohner K Darria Corvera

## 2024-09-30 DIAGNOSIS — I5042 Chronic combined systolic (congestive) and diastolic (congestive) heart failure: Secondary | ICD-10-CM | POA: Diagnosis not present

## 2024-09-30 DIAGNOSIS — I5020 Unspecified systolic (congestive) heart failure: Secondary | ICD-10-CM | POA: Diagnosis not present

## 2024-09-30 DIAGNOSIS — M3212 Pericarditis in systemic lupus erythematosus: Secondary | ICD-10-CM | POA: Diagnosis not present

## 2024-09-30 DIAGNOSIS — I3139 Other pericardial effusion (noninflammatory): Secondary | ICD-10-CM | POA: Diagnosis not present

## 2024-09-30 DIAGNOSIS — I10 Essential (primary) hypertension: Secondary | ICD-10-CM | POA: Diagnosis not present

## 2024-09-30 DIAGNOSIS — T82898A Other specified complication of vascular prosthetic devices, implants and grafts, initial encounter: Secondary | ICD-10-CM | POA: Diagnosis not present

## 2024-09-30 DIAGNOSIS — N186 End stage renal disease: Secondary | ICD-10-CM | POA: Diagnosis not present

## 2024-09-30 LAB — CBC WITH DIFFERENTIAL/PLATELET
Abs Immature Granulocytes: 0.18 10*3/uL — ABNORMAL HIGH (ref 0.00–0.07)
Basophils Absolute: 0 10*3/uL (ref 0.0–0.1)
Basophils Relative: 0 %
Eosinophils Absolute: 0 10*3/uL (ref 0.0–0.5)
Eosinophils Relative: 0 %
HCT: 28.2 % — ABNORMAL LOW (ref 36.0–46.0)
Hemoglobin: 8.7 g/dL — ABNORMAL LOW (ref 12.0–15.0)
Immature Granulocytes: 1 %
Lymphocytes Relative: 5 %
Lymphs Abs: 1 10*3/uL (ref 0.7–4.0)
MCH: 27.3 pg (ref 26.0–34.0)
MCHC: 30.9 g/dL (ref 30.0–36.0)
MCV: 88.4 fL (ref 80.0–100.0)
Monocytes Absolute: 1.3 10*3/uL — ABNORMAL HIGH (ref 0.1–1.0)
Monocytes Relative: 7 %
Neutro Abs: 17 10*3/uL — ABNORMAL HIGH (ref 1.7–7.7)
Neutrophils Relative %: 87 %
Platelets: 383 10*3/uL (ref 150–400)
RBC: 3.19 MIL/uL — ABNORMAL LOW (ref 3.87–5.11)
RDW: 21.4 % — ABNORMAL HIGH (ref 11.5–15.5)
Smear Review: NORMAL
WBC: 19.5 10*3/uL — ABNORMAL HIGH (ref 4.0–10.5)
nRBC: 0 % (ref 0.0–0.2)

## 2024-09-30 LAB — BASIC METABOLIC PANEL WITH GFR
Anion gap: 14 (ref 5–15)
BUN: 35 mg/dL — ABNORMAL HIGH (ref 6–20)
CO2: 26 mmol/L (ref 22–32)
Calcium: 10.5 mg/dL — ABNORMAL HIGH (ref 8.9–10.3)
Chloride: 93 mmol/L — ABNORMAL LOW (ref 98–111)
Creatinine, Ser: 4.3 mg/dL — ABNORMAL HIGH (ref 0.44–1.00)
GFR, Estimated: 14 mL/min — ABNORMAL LOW
Glucose, Bld: 115 mg/dL — ABNORMAL HIGH (ref 70–99)
Potassium: 5 mmol/L (ref 3.5–5.1)
Sodium: 132 mmol/L — ABNORMAL LOW (ref 135–145)

## 2024-09-30 LAB — SEDIMENTATION RATE: Sed Rate: 126 mm/h — ABNORMAL HIGH (ref 0–22)

## 2024-09-30 NOTE — Progress Notes (Signed)
 Patient is refusing tele monitoring. MD notified

## 2024-09-30 NOTE — Progress Notes (Signed)
 "  Rounding Note   Patient Name: Linda [name] Date of Encounter: 09/30/2024  [facility] Cardiologist: Madonna Large, DO   Subjective BP 128/87. Reports continues to have pleuritic chest pain but improved from yesterday  Scheduled Meds:  amLODipine   10 mg Oral QHS   carvedilol   25 mg Oral BID WC   Chlorhexidine  Gluconate Cloth  6 each Topical Q0600   Chlorhexidine  Gluconate Cloth  6 each Topical Q0600   cloNIDine   0.1 mg Oral Daily   darbepoetin (ARANESP ) injection - DIALYSIS  200 mcg Subcutaneous Q Sun-1800   diclofenac  Sodium  2 g Topical QID   furosemide   80 mg Oral Daily   irbesartan   300 mg Oral Daily   pantoprazole   40 mg Oral Daily   predniSONE   40 mg Oral Q breakfast   sevelamer  carbonate  2.4 g Oral TID WC   Continuous Infusions:   PRN Meds: hydrALAZINE , HYDROmorphone  (DILAUDID ) injection, methocarbamol , naLOXone  (NARCAN )  injection, nitroGLYCERIN , oxyCODONE    Vital Signs  Vitals:   09/29/24 1654 09/29/24 2006 09/30/24 0524 09/30/24 0821  BP: (!) 142/113 129/86 115/89 128/87  Pulse: 70 75 66 64  Resp: 18 18 18 18   Temp: 97.7 F (36.5 C) 98.2 F (36.8 C) 97.8 F (36.6 C) 97.7 F (36.5 C)  TempSrc: Oral     SpO2: 95% 93% 95% 98%  Weight:      Height:        Intake/Output Summary (Last 24 hours) at 09/30/2024 1119 Last data filed at 09/30/2024 0600 Gross per 24 hour  Intake 420 ml  Output 3900 ml  Net -3480 ml      09/27/2024   12:51 PM 09/27/2024    8:29 AM 09/25/2024    1:31 PM  Last 3 Weights  Weight (lbs) 153 lb 14.1 oz 163 lb 12.8 oz 162 lb 14.7 oz   Weight (kg) 69.8 kg 74.3 kg 73.9 kg      Significant value      Telemetry NSR- Personally Reviewed  ECG  No new ECG - Personally Reviewed  Physical Exam  GEN: Appears uncomfortable  Neck: No JVD Cardiac: RRR, + rub Respiratory: Clear to auscultation bilaterally. GI: Soft, nontender, non-distended  MS: No edema; No deformity. Neuro:  Nonfocal  Psych: Normal affect    Labs High Sensitivity Troponin:  No results for input(s): TROPONINIHS in the last 720 hours.  Recent Labs  Lab 09/14/24 0734 09/14/24 0937  TRNPT 118* 112*       Chemistry Recent Labs  Lab 09/24/24 0627 09/25/24 0303 09/27/24 0830 09/29/24 0508 09/30/24 0508  NA 129*   < > 132* 132* 132*  K 4.8   < > 5.4* 4.5 5.0  CL 89*   < > 91* 89* 93*  CO2 25   < > 25 25 26   GLUCOSE 85   < > 116* 110* 115*  BUN 41*   < > 48* 48* 35*  CREATININE 4.74*   < > 6.09* 5.98* 4.30*  CALCIUM  9.8   < > 10.4* 10.5* 10.5*  MG 2.0  --   --   --   --   ALBUMIN   --   --  3.2* 3.2*  --   GFRNONAA 12*   < > 9* 9* 14*  ANIONGAP 15   < > 17* 17* 14   < > = values in this interval not displayed.    Lipids No results for input(s): CHOL, TRIG, HDL, LABVLDL, LDLCALC, CHOLHDL in the  last 168 hours.  Hematology Recent Labs  Lab 09/27/24 0830 09/29/24 0508 09/30/24 0508  WBC 17.8* 23.5* 19.5*  RBC 2.75* 3.03* 3.19*  HGB 7.5* 8.3* 8.7*  HCT 23.6* 26.7* 28.2*  MCV 85.8 88.1 88.4  MCH 27.3 27.4 27.3  MCHC 31.8 31.1 30.9  RDW 21.7* 21.2* 21.4*  PLT 315 382 383   Thyroid No results for input(s): TSH, FREET4 in the last 168 hours.  BNPNo results for input(s): BNP, PROBNP in the last 168 hours.  DDimer No results for input(s): DDIMER in the last 168 hours.   Radiology  ECHOCARDIOGRAM LIMITED Result Date: 09/29/2024    ECHOCARDIOGRAM LIMITED REPORT   Patient Name:   Linda [name] Date of Exam: 09/29/2024 Medical Rec #:  [id number]             Height:       66.0 in Accession #:    [id number]            Weight:       153.9 lb Date of Birth:  [birth year]             BSA:          1.789 m Patient Age:    28 years              BP:           133/114 mmHg Patient Gender: F                     HR:           71 bpm. Exam Location:  Inpatient Procedure: Limited Echo (Both Spectral and Color Flow Doppler were utilized            during procedure). Indications:    pericardial effusion.   History:        Patient has prior history of Echocardiogram examinations, most                 recent 09/27/2024. CHF, pericarditis, end stage renal disease.                 Lupus.; Risk Factors:Hypertension and Current Smoker.  Sonographer:    Tinnie Barefoot RDCS Referring Phys: [id number] ARUN K THUKKANI IMPRESSIONS  1. Left ventricular ejection fraction, by estimation, is 40 to 45%. The left ventricle has mildly decreased function. The left ventricle demonstrates global hypokinesis.  2. Moderate pericardial effusion. The pericardial effusion is circumferential. There is no evidence of cardiac tamponade.  3. The inferior vena cava is dilated in size with <50% respiratory variability, suggesting right atrial pressure of 15 mmHg. Comparison(s): A prior study was performed on 09/27/2024. Pericardial effusion is improved compared to the prior study, particularly in the anterior aspect. FINDINGS  Left Ventricle: Left ventricular ejection fraction, by estimation, is 40 to 45%. The left ventricle has mildly decreased function. The left ventricle demonstrates global hypokinesis. Pericardium: 24% respiratory variation of mitral inflow velocity is not hemodyanimcally significant 26% respiratory variation of tricuspid inflow velocity is not hemodynamically significant No diastolic RV collapse No prolonged right atrial systolic collapse. A moderately sized pericardial effusion is present. The pericardial effusion is circumferential. There is no evidence of cardiac tamponade. Venous: The inferior vena cava is dilated in size with less than 50% respiratory variability, suggesting right atrial pressure of 15 mmHg. Additional Comments: Spectral Doppler performed.  Emeline Calender Electronically signed by Emeline Calender Signature Date/Time: 09/29/2024/4:57:20 PM    Final    ABORTED  INVASIVE LAB PROCEDURE Result Date: 09/29/2024 This case was aborted.   Cardiac Studies   Patient Profile   29 y.o. female with history of lupus, ESRD  on HD, chronic systolic heart failure who we are consulted for evaluation of pericardial effusion.  Assessment & Plan   Pericardial effusion/pericarditis: Moderate effusion on echocardiogram 1/18, repeat echocardiogram 1/28 showed large effusion.  No evidence of tamponade.   - Sent for pericardiocentesis on 1/30 but effusion.  Improved after HD, not enough fluid anteriorly to drain.  Will monitor, plan repeat echocardiogram in 72 hours -She is reporting pleuritic/positional chest pain consistent with pericarditis.  Likely due to lupus, but uremic pericarditis on ddx, though denies any missed HD.  Significantly elevated inflammatory markers, ESR 126 and CRP 12.3.  Will check C3/4 and anti-dsDNA  No NSAIDs or colchicine  given ESRD.  Will start prednisone  40 mg daily and plan slow taper, likely decrease by 5mg  weekly until off.   Chronic systolic heart failure: EF 40 to 45% on echo.  Volume management per HD.  Continue Coreg , irbesartan   For questions or updates, please contact [facility] Please consult [email or web address] for contact info under     Signed, Lonni LITTIE Nanas, MD  09/30/2024, 11:19 AM    "

## 2024-09-30 NOTE — Progress Notes (Signed)
 "  PROGRESS NOTE    Linda [name]  FMW:[id number] DOB: 08-17-1996 DOA: 09/13/2024 PCP: [name], NP   Brief Narrative: Linda [name] is a 29 y.o. female with a history of hypertension, G6PD deficiency, CHF, lupus, ESRD on HD.  Patient presented secondary to left arm swelling in setting of recent AV fistula surgery and recent wound dehiscence. Vascular surgery consulted and performed AV fistula washout with wound vac placement. Patient with significant pleuritic chest pain. Workup significant for worsened pericardial effusion with symptoms consistent with pericarditis. Prednisone  with taper started.   Assessment and Plan:  Left upper extremity edema In setting of recent AV fistula surgery. Resultant dehiscence of wound. Vascular surgery consulted and performed a left arm brachiobasilic fistula ligation with I&D and placement of a wound vac. No growth on wound culture. - Vascular surgery recommendations: pending today  Pericardial effusion Noted on Transthoracic Echocardiogram. No clinical evidence of tamponade. Repeat limited echo performed on 1/28 and is significant for a significant enlargement of the effusion. Cardiology consulted with initial plan for a pericardiocentesis on 1/30, which was deferred secondary to posterior fluid location.  Pericarditis Patient with troponin of 118 with negative delta of 112. Echo with slightly improved LVEF of 40-45% with global hypokinesis. Cardiology was consulted for workup/management. Symptoms appeared to improved with ongoing treatment for volume overload. Prednisone  started for concern for musculoskeletal component. Patient started on prednisone  for likely musculoskeletal etiology, however symptoms returned after taper. Per cardiology, etiology consistent with pericarditis; possibly related to lupus vs uremia. - Cardiology recommendations: prednisone  with taper  Dyspnea on exertion Concern this could be related to patient's  pericardial effusion vs fluid overload. Last hemodialysis was 1/28. Chest x-ray confirms known cardiomegaly. No hypoxia with ambulation. Symptoms appear to be improved.  ESRD on HD MWF schedule. Nephrology consulted for ongoing hemodialysis needs.  Hyperkalemia Patient given Lokelma . Continued management per nephrology with hemodialysis.  Leukocytosis Worsening WBC from 1/27. Appears to correlate with initiation of prednisone . Up to 23,500 and has trended down; no fevers.  Acute on chronic HFrEF Managed via hemodialysis.  GERD - Continue Protonix  and sucralfate   Primary hypertension - Continue amlodipine , carvedilol , clonidine  and Lasix   Hyponatremia Secondary to volume overload. Management per HD.  Vitamin D deficiency Patient started on doxercalciferol  with HD.   DVT prophylaxis: SCDs Code Status:   Code Status: Full Code Family Communication: None at bedside Disposition Plan: Discharge pending ongoing vascular surgery and cardiology recommendations   Consultants:  Vascular surgery Cardiology  Procedures:  Transthoracic Echocardiogram  Antimicrobials: Cefepime  Vancomycin     Subjective: No significant overnight events.  Objective: BP 128/87   Pulse 64   Temp 97.7 F (36.5 C)   Resp 18   Ht 5' 6 (1.676 m)   Wt 69.8 kg   LMP  (LMP Unknown)   SpO2 98%   BMI 24.84 kg/m   Examination:  General exam: Appears calm and comfortable. Respiratory system: Respiratory effort normal. Central nervous system: Alert.   Data Reviewed: I have personally reviewed following labs and imaging studies  CBC Lab Results  Component Value Date   WBC 19.5 (H) 09/30/2024   RBC 3.19 (L) 09/30/2024   HGB 8.7 (L) 09/30/2024   HCT 28.2 (L) 09/30/2024   MCV 88.4 09/30/2024   MCH 27.3 09/30/2024   PLT 383 09/30/2024   MCHC 30.9 09/30/2024   RDW 21.4 (H) 09/30/2024   LYMPHSABS 1.0 09/30/2024   MONOABS 1.3 (H) 09/30/2024   EOSABS 0.0 09/30/2024   BASOSABS  0.0  09/30/2024     Last metabolic panel Lab Results  Component Value Date   NA 132 (L) 09/30/2024   K 5.0 09/30/2024   CL 93 (L) 09/30/2024   CO2 26 09/30/2024   BUN 35 (H) 09/30/2024   CREATININE 4.30 (H) 09/30/2024   GLUCOSE 115 (H) 09/30/2024   GFRNONAA 14 (L) 09/30/2024   GFRAA 19 (L) 03/27/2019   CALCIUM  10.5 (H) 09/30/2024   PHOS 5.7 (H) 09/29/2024   PROT 7.9 09/13/2024   ALBUMIN  3.2 (L) 09/29/2024   BILITOT 1.1 09/13/2024   ALKPHOS 174 09/13/2024   AST 26 09/13/2024   ALT 7 09/13/2024   ANIONGAP 14 09/30/2024    GFR: Estimated Creatinine Clearance: 18.1 mL/min (A) (by C-G formula based on SCr of 4.3 mg/dL (H)).  No results found for this or any previous visit (from the past 240 hours).    Radiology Studies: ECHOCARDIOGRAM LIMITED Result Date: 09/29/2024    ECHOCARDIOGRAM LIMITED REPORT   Patient Name:   Linda [name] Date of Exam: 09/29/2024 Medical Rec #:  [id number]             Height:       66.0 in Accession #:    [id number]            Weight:       153.9 lb Date of Birth:  10-28-1995             BSA:          1.789 m Patient Age:    28 years              BP:           133/114 mmHg Patient Gender: F                     HR:           71 bpm. Exam Location:  Inpatient Procedure: Limited Echo (Both Spectral and Color Flow Doppler were utilized            during procedure). Indications:    pericardial effusion.  History:        Patient has prior history of Echocardiogram examinations, most                 recent 09/27/2024. CHF, pericarditis, end stage renal disease.                 Lupus.; Risk Factors:Hypertension and Current Smoker.  Sonographer:    Tinnie Barefoot RDCS Referring Phys: [id number] ARUN K THUKKANI IMPRESSIONS  1. Left ventricular ejection fraction, by estimation, is 40 to 45%. The left ventricle has mildly decreased function. The left ventricle demonstrates global hypokinesis.  2. Moderate pericardial effusion. The pericardial effusion is circumferential.  There is no evidence of cardiac tamponade.  3. The inferior vena cava is dilated in size with <50% respiratory variability, suggesting right atrial pressure of 15 mmHg. Comparison(s): A prior study was performed on 09/27/2024. Pericardial effusion is improved compared to the prior study, particularly in the anterior aspect. FINDINGS  Left Ventricle: Left ventricular ejection fraction, by estimation, is 40 to 45%. The left ventricle has mildly decreased function. The left ventricle demonstrates global hypokinesis. Pericardium: 24% respiratory variation of mitral inflow velocity is not hemodyanimcally significant 26% respiratory variation of tricuspid inflow velocity is not hemodynamically significant No diastolic RV collapse No prolonged right atrial systolic collapse. A moderately sized pericardial effusion is present. The pericardial effusion is circumferential.  There is no evidence of cardiac tamponade. Venous: The inferior vena cava is dilated in size with less than 50% respiratory variability, suggesting right atrial pressure of 15 mmHg. Additional Comments: Spectral Doppler performed.  Emeline Calender Electronically signed by Emeline Calender Signature Date/Time: 09/29/2024/4:57:20 PM    Final    ABORTED INVASIVE LAB PROCEDURE Result Date: 09/29/2024 This case was aborted.     LOS: 16 days    Elgin Lam, MD [facility] 09/30/2024, 4:09 PM   If 7PM-7AM, please contact night-coverage [email or web address]  "

## 2024-09-30 NOTE — Plan of Care (Signed)
" °  Problem: Health Behavior/Discharge Planning: Goal: Ability to manage health-related needs will improve Outcome: Progressing   Problem: Clinical Measurements: Goal: Will remain free from infection Outcome: Progressing Goal: Respiratory complications will improve Outcome: Progressing Goal: Cardiovascular complication will be avoided Outcome: Progressing   Problem: Activity: Goal: Risk for activity intolerance will decrease Outcome: Progressing   Problem: Nutrition: Goal: Adequate nutrition will be maintained Outcome: Progressing   Problem: Coping: Goal: Level of anxiety will decrease Outcome: Progressing   Problem: Elimination: Goal: Will not experience complications related to bowel motility Outcome: Progressing Goal: Will not experience complications related to urinary retention Outcome: Progressing   Problem: Pain Managment: Goal: General experience of comfort will improve and/or be controlled Outcome: Progressing   Problem: Safety: Goal: Ability to remain free from injury will improve Outcome: Progressing   Problem: Skin Integrity: Goal: Risk for impaired skin integrity will decrease Outcome: Progressing   Problem: Education: Goal: Knowledge of the prescribed therapeutic regimen will improve Outcome: Progressing   Problem: Bowel/Gastric: Goal: Gastrointestinal status for postoperative course will improve Outcome: Progressing   Problem: Cardiac: Goal: Ability to maintain an adequate cardiac output Outcome: Progressing Goal: Will show no evidence of cardiac arrhythmias Outcome: Progressing   Problem: Nutritional: Goal: Will attain and maintain optimal nutritional status Outcome: Progressing   Problem: Neurological: Goal: Will regain or maintain usual level of consciousness Outcome: Progressing   Problem: Clinical Measurements: Goal: Ability to maintain clinical measurements within normal limits Outcome: Progressing Goal: Postoperative complications  will be avoided or minimized Outcome: Progressing   Problem: Respiratory: Goal: Will regain and/or maintain adequate ventilation Outcome: Progressing Goal: Respiratory status will improve Outcome: Progressing   Problem: Skin Integrity: Goal: Demonstrates signs of wound healing without infection Outcome: Progressing   Problem: Urinary Elimination: Goal: Will remain free from infection Outcome: Progressing Goal: Ability to achieve and maintain adequate urine output Outcome: Progressing   Problem: Fluid Volume: Goal: Compliance with measures to maintain balanced fluid volume will improve Outcome: Progressing   Problem: Health Behavior/Discharge Planning: Goal: Ability to manage health-related needs will improve Outcome: Progressing   Problem: Nutritional: Goal: Ability to make healthy dietary choices will improve Outcome: Progressing   Problem: Clinical Measurements: Goal: Complications related to the disease process, condition or treatment will be avoided or minimized Outcome: Progressing   "

## 2024-09-30 NOTE — Progress Notes (Signed)
 " [facility] Progress Note   Subjective:   Seen in room. Completed dialysis yesterday- net UF 3.9L. Pericardiocentesis deferred. Feels about the same. Intermittent chest pain, sob.   Objective Vitals:   09/29/24 1654 09/29/24 2006 09/30/24 0524 09/30/24 0821  BP: (!) 142/113 129/86 115/89 128/87  Pulse: 70 75 66 64  Resp: 18 18 18 18   Temp: 97.7 F (36.5 C) 98.2 F (36.8 C) 97.8 F (36.6 C) 97.7 F (36.5 C)  TempSrc: Oral     SpO2: 95% 93% 95% 98%  Weight:      Height:       Physical Exam General: Alert, nad  Lungs: Clear, normal wob on RA CV: RRR, ?rub  Abdomen: non-distended Extremities: no LE edema, L arm edema improved Dialysis Access: [facility]  Additional Objective Labs: Basic Metabolic Panel: Recent Labs  Lab 09/24/24 0627 09/25/24 0303 09/27/24 0830 09/29/24 0508 09/30/24 0508  NA 129*   < > 132* 132* 132*  K 4.8   < > 5.4* 4.5 5.0  CL 89*   < > 91* 89* 93*  CO2 25   < > 25 25 26   GLUCOSE 85   < > 116* 110* 115*  BUN 41*   < > 48* 48* 35*  CREATININE 4.74*   < > 6.09* 5.98* 4.30*  CALCIUM  9.8   < > 10.4* 10.5* 10.5*  PHOS 5.8*  --  7.3* 5.7*  --    < > = values in this interval not displayed.   Liver Function Tests: Recent Labs  Lab 09/27/24 0830 09/29/24 0508  ALBUMIN  3.2* 3.2*   No results for input(s): LIPASE, AMYLASE in the last 168 hours. CBC: Recent Labs  Lab 09/25/24 0303 09/26/24 1130 09/27/24 0830 09/29/24 0508 09/30/24 0508  WBC 8.4 11.4* 17.8* 23.5* 19.5*  NEUTROABS  --   --   --   --  17.0*  HGB 7.4* 8.1* 7.5* 8.3* 8.7*  HCT 24.7* 26.3* 23.6* 26.7* 28.2*  MCV 90.1 88.3 85.8 88.1 88.4  PLT 348 348 315 382 383   Blood Culture    Component Value Date/Time   SDES BLOOD RIGHT HAND 09/16/2024 1326   SPECREQUEST  09/16/2024 1326    BOTTLES DRAWN AEROBIC ONLY Blood Culture results may not be optimal due to an inadequate volume of blood received in culture bottles   CULT  09/16/2024 1326    NO GROWTH 5  DAYS Performed at [facility], [address] [place], [place] [address]    REPTSTATUS 09/21/2024 FINAL 09/16/2024 1326    Cardiac Enzymes: No results for input(s): CKTOTAL, CKMB, CKMBINDEX, TROPONINI in the last 168 hours. CBG: No results for input(s): GLUCAP in the last 168 hours. Iron  Studies: No results for input(s): IRON , TIBC, TRANSFERRIN, FERRITIN in the last 72 hours. @lablastinr3 @ Studies/Results: ECHOCARDIOGRAM LIMITED Result Date: 09/29/2024    ECHOCARDIOGRAM LIMITED REPORT   Patient Name:   Linda [name] Date of Exam: 09/29/2024 Medical Rec #:  [id number]             Height:       66.0 in Accession #:    [id number]            Weight:       153.9 lb Date of Birth:  May 30, 1996             BSA:          1.789 m Patient Age:    29 years  BP:           133/114 mmHg Patient Gender: F                     HR:           71 bpm. Exam Location:  Inpatient Procedure: Limited Echo (Both Spectral and Color Flow Doppler were utilized            during procedure). Indications:    pericardial effusion.  History:        Patient has prior history of Echocardiogram examinations, most                 recent 09/27/2024. CHF, pericarditis, end stage renal disease.                 Lupus.; Risk Factors:Hypertension and Current Smoker.  Sonographer:    Tinnie Barefoot RDCS Referring Phys: [id number] ARUN K THUKKANI IMPRESSIONS  1. Left ventricular ejection fraction, by estimation, is 40 to 45%. The left ventricle has mildly decreased function. The left ventricle demonstrates global hypokinesis.  2. Moderate pericardial effusion. The pericardial effusion is circumferential. There is no evidence of cardiac tamponade.  3. The inferior vena cava is dilated in size with <50% respiratory variability, suggesting right atrial pressure of 15 mmHg. Comparison(s): A prior study was performed on 09/27/2024. Pericardial effusion is improved compared to the prior study, particularly in  the anterior aspect. FINDINGS  Left Ventricle: Left ventricular ejection fraction, by estimation, is 40 to 45%. The left ventricle has mildly decreased function. The left ventricle demonstrates global hypokinesis. Pericardium: 24% respiratory variation of mitral inflow velocity is not hemodyanimcally significant 26% respiratory variation of tricuspid inflow velocity is not hemodynamically significant No diastolic RV collapse No prolonged right atrial systolic collapse. A moderately sized pericardial effusion is present. The pericardial effusion is circumferential. There is no evidence of cardiac tamponade. Venous: The inferior vena cava is dilated in size with less than 50% respiratory variability, suggesting right atrial pressure of 15 mmHg. Additional Comments: Spectral Doppler performed.  Emeline Calender Electronically signed by Emeline Calender Signature Date/Time: 09/29/2024/4:57:20 PM    Final    ABORTED INVASIVE LAB PROCEDURE Result Date: 09/29/2024 This case was aborted.  DG CHEST PORT 1 VIEW Result Date: 09/28/2024 CLINICAL DATA:  Dyspnea on exertion. EXAM: PORTABLE CHEST 1 VIEW COMPARISON:  September 26, 2024 FINDINGS: There is stable right-sided venous catheter positioning. The cardiac silhouette is markedly enlarged and unchanged in size. Stable moderate severity atelectasis and/or infiltrate is seen within the retrocardiac region of the left lung base. No pleural effusion or pneumothorax is identified. A radiopaque vascular stent is seen within the medial aspect of the left upper extremity. The visualized skeletal structures are unremarkable. IMPRESSION: Stable cardiomegaly with moderate severity left basilar atelectasis and/or infiltrate. Electronically Signed   By: Suzen Dials M.D.   On: 09/28/2024 18:29   Medications:    amLODipine   10 mg Oral QHS   carvedilol   25 mg Oral BID WC   Chlorhexidine  Gluconate Cloth  6 each Topical Q0600   Chlorhexidine  Gluconate Cloth  6 each Topical Q0600    cloNIDine   0.1 mg Oral Daily   darbepoetin (ARANESP ) injection - DIALYSIS  200 mcg Subcutaneous Q Sun-1800   diclofenac  Sodium  2 g Topical QID   furosemide   80 mg Oral Daily   irbesartan   300 mg Oral Daily   pantoprazole   40 mg Oral Daily   predniSONE   40 mg Oral Q  breakfast   sevelamer  carbonate  2.4 g Oral TID WC    Dialysis Orders: MWF - [facility] 3.5hrs, BFR 400, DFR 500,  EDW 68.4kg, 2K/ 2Ca, Heparin  3000 units bolus - Mircera 225 mcg q2wks - last 09/11/24 - Hectorol  5mcg IV qHD - last 09/13/24 - Sensipar  90mg  with HD - last 09/13/24  Assessment/Plan: Pericardial effusion - enlarging effusion despite HD. No tamponade on Echo. Prednisone  started. Cardiology following -pericardiocentesis deferred  AVF malfunction - VVS following. Hx multiple revisions in the past, last 1/7. S/p ligation 1/16. S/p I&D with wound vac placement 1/20.  ESRD -  on HD MWF. Declines extra HD today. Next HD Mon.  Hypertension/volume  - BP meds resumed. UF as tolerated with HD as above HFrEF - max volume removal with HD as tolerated  Anemia of CKD - Hgb 8.3, on high dose ESA Secondary Hyperparathyroidism -  Ca ok, Phos high - restarted binders (sevelamer ). Stopped hectorol .  Nutrition - Renal diet with fluid restriction GERD - Had been getting sucralfate , stopped. Continue PPI  Aison Malveaux Ronnald Acosta PA-C [facility] 09/30/2024,9:48 AM   "

## 2024-10-01 DIAGNOSIS — I5042 Chronic combined systolic (congestive) and diastolic (congestive) heart failure: Secondary | ICD-10-CM | POA: Diagnosis not present

## 2024-10-01 DIAGNOSIS — I3139 Other pericardial effusion (noninflammatory): Secondary | ICD-10-CM | POA: Diagnosis not present

## 2024-10-01 DIAGNOSIS — M3212 Pericarditis in systemic lupus erythematosus: Secondary | ICD-10-CM | POA: Diagnosis not present

## 2024-10-01 DIAGNOSIS — T82898A Other specified complication of vascular prosthetic devices, implants and grafts, initial encounter: Secondary | ICD-10-CM | POA: Diagnosis not present

## 2024-10-01 DIAGNOSIS — I5020 Unspecified systolic (congestive) heart failure: Secondary | ICD-10-CM | POA: Diagnosis not present

## 2024-10-01 DIAGNOSIS — N186 End stage renal disease: Secondary | ICD-10-CM | POA: Diagnosis not present

## 2024-10-01 DIAGNOSIS — I10 Essential (primary) hypertension: Secondary | ICD-10-CM | POA: Diagnosis not present

## 2024-10-01 LAB — CBC
HCT: 30.7 % — ABNORMAL LOW (ref 36.0–46.0)
Hemoglobin: 9.2 g/dL — ABNORMAL LOW (ref 12.0–15.0)
MCH: 26.9 pg (ref 26.0–34.0)
MCHC: 30 g/dL (ref 30.0–36.0)
MCV: 89.8 fL (ref 80.0–100.0)
Platelets: 415 10*3/uL — ABNORMAL HIGH (ref 150–400)
RBC: 3.42 MIL/uL — ABNORMAL LOW (ref 3.87–5.11)
RDW: 21.4 % — ABNORMAL HIGH (ref 11.5–15.5)
WBC: 26.4 10*3/uL — ABNORMAL HIGH (ref 4.0–10.5)
nRBC: 0 % (ref 0.0–0.2)

## 2024-10-01 LAB — BASIC METABOLIC PANEL WITH GFR
Anion gap: 15 (ref 5–15)
BUN: 56 mg/dL — ABNORMAL HIGH (ref 6–20)
CO2: 26 mmol/L (ref 22–32)
Calcium: 10.6 mg/dL — ABNORMAL HIGH (ref 8.9–10.3)
Chloride: 93 mmol/L — ABNORMAL LOW (ref 98–111)
Creatinine, Ser: 5.43 mg/dL — ABNORMAL HIGH (ref 0.44–1.00)
GFR, Estimated: 10 mL/min — ABNORMAL LOW
Glucose, Bld: 117 mg/dL — ABNORMAL HIGH (ref 70–99)
Potassium: 4.6 mmol/L (ref 3.5–5.1)
Sodium: 134 mmol/L — ABNORMAL LOW (ref 135–145)

## 2024-10-01 LAB — C3 COMPLEMENT: C3 Complement: 160 mg/dL (ref 82–167)

## 2024-10-01 LAB — C4 COMPLEMENT: Complement C4, Body Fluid: 21 mg/dL (ref 12–38)

## 2024-10-01 MED ORDER — ALUM & MAG HYDROXIDE-SIMETH 200-200-20 MG/5ML PO SUSP
30.0000 mL | Freq: Four times a day (QID) | ORAL | Status: AC | PRN
  Administered 2024-10-01: 30 mL via ORAL
  Filled 2024-10-01: qty 30

## 2024-10-01 NOTE — Progress Notes (Signed)
 "  PROGRESS NOTE    Linda [name]  FMW:[id number] DOB: 03-25-1996 DOA: 09/13/2024 PCP: [name], NP   Brief Narrative: Linda [name] is a 29 y.o. female with a history of hypertension, G6PD deficiency, CHF, lupus, ESRD on HD.  Patient presented secondary to left arm swelling in setting of recent AV fistula surgery and recent wound dehiscence. Vascular surgery consulted and performed AV fistula washout with wound vac placement. Patient with significant pleuritic chest pain. Workup significant for worsened pericardial effusion with symptoms consistent with pericarditis. Prednisone  with taper started.   Assessment and Plan:  Left upper extremity edema In setting of recent AV fistula surgery. Resultant dehiscence of wound. Vascular surgery consulted and performed a left arm brachiobasilic fistula ligation with I&D and placement of a wound vac. No growth on wound culture. - Vascular surgery recommendations: pending today  Pericardial effusion Noted on Transthoracic Echocardiogram. No clinical evidence of tamponade. Repeat limited echo performed on 1/28 and is significant for a significant enlargement of the effusion. Cardiology consulted with initial plan for a pericardiocentesis on 1/30, which was deferred secondary to posterior fluid location. - Cardiology recommendations: plan to repeat Transthoracic Echocardiogram on 2/2  Pericarditis Patient with troponin of 118 with negative delta of 112. Echo with slightly improved LVEF of 40-45% with global hypokinesis. Cardiology was consulted for workup/management. Symptoms appeared to improved with ongoing treatment for volume overload. Prednisone  started for concern for musculoskeletal component. Patient started on prednisone  for likely musculoskeletal etiology, however symptoms returned after taper. Per cardiology, etiology consistent with pericarditis; possibly related to lupus vs uremia. - Cardiology recommendations:  prednisone  with taper  Dyspnea on exertion Concern this could be related to patient's pericardial effusion vs fluid overload. Last hemodialysis was 1/28. Chest x-ray confirms known cardiomegaly. No hypoxia with ambulation. Symptoms appear to be improved.  ESRD on HD MWF schedule. Nephrology consulted for ongoing hemodialysis needs.  Hyperkalemia Patient given Lokelma . Continued management per nephrology with hemodialysis.  Leukocytosis Worsening WBC from 1/27. Appears to correlate with initiation of prednisone . Up to 23,500 and has trended down; no fevers.  Acute on chronic HFrEF Managed via hemodialysis.  GERD - Continue Protonix  and sucralfate   Primary hypertension - Continue amlodipine , carvedilol , clonidine  and Lasix   Hyponatremia Secondary to volume overload. Management per HD.  Vitamin D deficiency Patient started on doxercalciferol  with HD.   DVT prophylaxis: SCDs Code Status:   Code Status: Full Code Family Communication: None at bedside Disposition Plan: Discharge pending ongoing vascular surgery and cardiology recommendations   Consultants:  Vascular surgery Cardiology  Procedures:  Transthoracic Echocardiogram  Antimicrobials: Cefepime  Vancomycin     Subjective: No overnight events.  Objective: BP (!) 149/108 (BP Location: Right Wrist)   Pulse 68   Temp 97.7 F (36.5 C) (Oral)   Resp 18   Ht 5' 6 (1.676 m)   Wt 69.8 kg   LMP  (LMP Unknown)   SpO2 100%   BMI 24.84 kg/m   Examination:  General exam: Appears calm and comfortable. Respiratory system: Respiratory effort normal.   Data Reviewed: I have personally reviewed following labs and imaging studies  CBC Lab Results  Component Value Date   WBC 26.4 (H) 10/01/2024   RBC 3.42 (L) 10/01/2024   HGB 9.2 (L) 10/01/2024   HCT 30.7 (L) 10/01/2024   MCV 89.8 10/01/2024   MCH 26.9 10/01/2024   PLT 415 (H) 10/01/2024   MCHC 30.0 10/01/2024   RDW 21.4 (H) 10/01/2024   LYMPHSABS 1.0  09/30/2024  MONOABS 1.3 (H) 09/30/2024   EOSABS 0.0 09/30/2024   BASOSABS 0.0 09/30/2024     Last metabolic panel Lab Results  Component Value Date   NA 134 (L) 10/01/2024   K 4.6 10/01/2024   CL 93 (L) 10/01/2024   CO2 26 10/01/2024   BUN 56 (H) 10/01/2024   CREATININE 5.43 (H) 10/01/2024   GLUCOSE 117 (H) 10/01/2024   GFRNONAA 10 (L) 10/01/2024   GFRAA 19 (L) 03/27/2019   CALCIUM  10.6 (H) 10/01/2024   PHOS 5.7 (H) 09/29/2024   PROT 7.9 09/13/2024   ALBUMIN  3.2 (L) 09/29/2024   BILITOT 1.1 09/13/2024   ALKPHOS 174 09/13/2024   AST 26 09/13/2024   ALT 7 09/13/2024   ANIONGAP 15 10/01/2024    GFR: Estimated Creatinine Clearance: 14.3 mL/min (A) (by C-G formula based on SCr of 5.43 mg/dL (H)).  No results found for this or any previous visit (from the past 240 hours).    Radiology Studies: ECHOCARDIOGRAM LIMITED Result Date: 09/29/2024    ECHOCARDIOGRAM LIMITED REPORT   Patient Name:   Linda [name] Date of Exam: 09/29/2024 Medical Rec #:  [id number]             Height:       66.0 in Accession #:    [id number]            Weight:       153.9 lb Date of Birth:  Jan 02, 1996             BSA:          1.789 m Patient Age:    28 years              BP:           133/114 mmHg Patient Gender: F                     HR:           71 bpm. Exam Location:  Inpatient Procedure: Limited Echo (Both Spectral and Color Flow Doppler were utilized            during procedure). Indications:    pericardial effusion.  History:        Patient has prior history of Echocardiogram examinations, most                 recent 09/27/2024. CHF, pericarditis, end stage renal disease.                 Lupus.; Risk Factors:Hypertension and Current Smoker.  Sonographer:    Tinnie Barefoot RDCS Referring Phys: [id number] ARUN K THUKKANI IMPRESSIONS  1. Left ventricular ejection fraction, by estimation, is 40 to 45%. The left ventricle has mildly decreased function. The left ventricle demonstrates global hypokinesis.   2. Moderate pericardial effusion. The pericardial effusion is circumferential. There is no evidence of cardiac tamponade.  3. The inferior vena cava is dilated in size with <50% respiratory variability, suggesting right atrial pressure of 15 mmHg. Comparison(s): A prior study was performed on 09/27/2024. Pericardial effusion is improved compared to the prior study, particularly in the anterior aspect. FINDINGS  Left Ventricle: Left ventricular ejection fraction, by estimation, is 40 to 45%. The left ventricle has mildly decreased function. The left ventricle demonstrates global hypokinesis. Pericardium: 24% respiratory variation of mitral inflow velocity is not hemodyanimcally significant 26% respiratory variation of tricuspid inflow velocity is not hemodynamically significant No diastolic RV collapse No prolonged right atrial systolic collapse. A  moderately sized pericardial effusion is present. The pericardial effusion is circumferential. There is no evidence of cardiac tamponade. Venous: The inferior vena cava is dilated in size with less than 50% respiratory variability, suggesting right atrial pressure of 15 mmHg. Additional Comments: Spectral Doppler performed.  Emeline Calender Electronically signed by Emeline Calender Signature Date/Time: 09/29/2024/4:57:20 PM    Final    ABORTED INVASIVE LAB PROCEDURE Result Date: 09/29/2024 This case was aborted.     LOS: 17 days    Elgin Lam, MD [facility] 10/01/2024, 1:07 PM   If 7PM-7AM, please contact night-coverage [email or web address]  "

## 2024-10-01 NOTE — Progress Notes (Signed)
 Patient noted returning to room with food in her hand from hospital cafe. Woundvac noted unplugged. RN and charge RN Rosina reiterate the important of follow hospital safety policy and dietary restrictions due to illness. Patient verbalized understanding. MD aware. Patient appears in no distress at this time eating meal obtained from cafe of her own.

## 2024-10-02 ENCOUNTER — Inpatient Hospital Stay (HOSPITAL_COMMUNITY)

## 2024-10-02 DIAGNOSIS — I3139 Other pericardial effusion (noninflammatory): Secondary | ICD-10-CM

## 2024-10-02 DIAGNOSIS — I5020 Unspecified systolic (congestive) heart failure: Secondary | ICD-10-CM | POA: Diagnosis not present

## 2024-10-02 DIAGNOSIS — T82898A Other specified complication of vascular prosthetic devices, implants and grafts, initial encounter: Secondary | ICD-10-CM | POA: Diagnosis not present

## 2024-10-02 DIAGNOSIS — N186 End stage renal disease: Secondary | ICD-10-CM | POA: Diagnosis not present

## 2024-10-02 DIAGNOSIS — I10 Essential (primary) hypertension: Secondary | ICD-10-CM | POA: Diagnosis not present

## 2024-10-02 LAB — BASIC METABOLIC PANEL WITH GFR
Anion gap: 20 — ABNORMAL HIGH (ref 5–15)
BUN: 72 mg/dL — ABNORMAL HIGH (ref 6–20)
CO2: 23 mmol/L (ref 22–32)
Calcium: 10.4 mg/dL — ABNORMAL HIGH (ref 8.9–10.3)
Chloride: 91 mmol/L — ABNORMAL LOW (ref 98–111)
Creatinine, Ser: 6.4 mg/dL — ABNORMAL HIGH (ref 0.44–1.00)
GFR, Estimated: 8 mL/min — ABNORMAL LOW
Glucose, Bld: 107 mg/dL — ABNORMAL HIGH (ref 70–99)
Potassium: 4.6 mmol/L (ref 3.5–5.1)
Sodium: 135 mmol/L (ref 135–145)

## 2024-10-02 LAB — ECHOCARDIOGRAM LIMITED
Calc EF: 58 %
Height: 66 in
Single Plane A2C EF: 53.4 %
Single Plane A4C EF: 63.6 %
Weight: 2416.24 [oz_av]

## 2024-10-02 LAB — CBC
HCT: 31.8 % — ABNORMAL LOW (ref 36.0–46.0)
Hemoglobin: 9.8 g/dL — ABNORMAL LOW (ref 12.0–15.0)
MCH: 27.5 pg (ref 26.0–34.0)
MCHC: 30.8 g/dL (ref 30.0–36.0)
MCV: 89.1 fL (ref 80.0–100.0)
Platelets: 475 10*3/uL — ABNORMAL HIGH (ref 150–400)
RBC: 3.57 MIL/uL — ABNORMAL LOW (ref 3.87–5.11)
RDW: 21.4 % — ABNORMAL HIGH (ref 11.5–15.5)
WBC: 27.8 10*3/uL — ABNORMAL HIGH (ref 4.0–10.5)
nRBC: 0 % (ref 0.0–0.2)

## 2024-10-02 LAB — ANTI-DNA ANTIBODY, DOUBLE-STRANDED: ds DNA Ab: 3 [IU]/mL (ref 0–9)

## 2024-10-02 MED ORDER — HEPARIN SODIUM (PORCINE) 1000 UNIT/ML DIALYSIS
3000.0000 [IU] | Freq: Once | INTRAMUSCULAR | Status: AC
  Administered 2024-10-02: 3000 [IU] via INTRAVENOUS_CENTRAL

## 2024-10-02 MED ORDER — HYDROMORPHONE HCL 1 MG/ML IJ SOLN
0.2000 mg | Freq: Once | INTRAMUSCULAR | Status: AC
  Administered 2024-10-03: 0.2 mg via INTRAVENOUS
  Filled 2024-10-02: qty 0.5

## 2024-10-02 MED ORDER — HEPARIN SODIUM (PORCINE) 1000 UNIT/ML IJ SOLN
3800.0000 [IU] | Freq: Once | INTRAMUSCULAR | Status: AC
  Administered 2024-10-02: 3800 [IU]

## 2024-10-02 MED ORDER — OXYCODONE HCL 5 MG PO TABS
5.0000 mg | ORAL_TABLET | ORAL | Status: AC | PRN
  Administered 2024-10-02: 5 mg via ORAL
  Administered 2024-10-02 – 2024-10-06 (×17): 10 mg via ORAL
  Filled 2024-10-02 (×18): qty 2

## 2024-10-02 MED ORDER — HEPARIN SODIUM (PORCINE) 1000 UNIT/ML IJ SOLN
INTRAMUSCULAR | Status: AC
  Filled 2024-10-02: qty 7

## 2024-10-02 NOTE — Progress Notes (Signed)
 Heart Failure Navigator Progress Note  Assessed for Heart & Vascular TOC clinic readiness.  Patient does not meet criteria due to ESRD on Hemodialysis. No HF TOC. .   Navigator will sign off at this time.   Stephane Haddock, BSN, Scientist, clinical (histocompatibility and immunogenetics) Only

## 2024-10-02 NOTE — Progress Notes (Signed)
 Pt. Came in awake and oriented. Consent verified and on file. Started with no complaints  UF removal: 2.8 Tx duration: 3.5 hours  Access access: Right CVC Access issue: none  Pt. Complained of pain of the abdomen and SOB. Pt. Refused any pain medications. Hooked pt. On 02 at 2LPm via nasal cannula  Tx completed Catheter locked abd dwelled with heparin  Endorsed to floor nurse Transported to room  Linda [name] Linda Jhaveri,RN [facility]

## 2024-10-02 NOTE — Progress Notes (Signed)
"  °  Progress Note  Patient Name: Linda [name] Date of Encounter: 10/02/2024 [facility] Cardiologist: Madonna Large, DO   Interval Summary   Feels a little better. Still with pain in chest when laying down  Vital Signs Vitals:   10/01/24 0800 10/01/24 0912 10/01/24 1548 10/01/24 2135  BP: (!) 140/92 (!) 149/108 (!) 145/93 (!) 141/99  Pulse: 62 68 60 66  Resp: 18 18 18 16   Temp: (!) 97.5 F (36.4 C) 97.7 F (36.5 C) 98 F (36.7 C) 97.7 F (36.5 C)  TempSrc: Oral Oral  Oral  SpO2: 100% 100% 97% 99%  Weight:      Height:        Intake/Output Summary (Last 24 hours) at 10/02/2024 0645 Last data filed at 10/01/2024 2200 Gross per 24 hour  Intake 580 ml  Output 0 ml  Net 580 ml      09/27/2024   12:51 PM 09/27/2024    8:29 AM 09/25/2024    1:31 PM  Last 3 Weights  Weight (lbs) 153 lb 14.1 oz 163 lb 12.8 oz 162 lb 14.7 oz   Weight (kg) 69.8 kg 74.3 kg 73.9 kg      Significant value      Telemetry/ECG  Latest EKG on 09/28/2024 shows T wave inversion lateral leads may be suggestive of evolving pericarditis.- Personally Reviewed  Physical Exam  GEN: No acute distress.   Neck: No JVD Cardiac: RRR, no murmurs, + pericardial friction rub, or gallops.  Respiratory: Clear to auscultation bilaterally. GI: Soft, nontender, non-distended  MS: No edema  Assessment & Plan   29 year old with end-stage renal disease on hemodialysis with lupus with acute pericarditis/pericardial effusion.  -On 1/30 was sent for pericardiocentesis but effusion had improved after hemodialysis and it was not enough to drain anteriorly. - Plan on limited echo today pleuritic positional chest pain consistent with pericarditis likely secondary to lupus but uremic pericarditis on differential.  Does not miss hemodialysis however. - ESR 126, CRP 12.3.  Significantly elevated. C3, 4 normal.  - Unable to utilize NSAIDs or colchicine  given ESRD.  Has been placed on prednisone  40 mg a day with slow  taper likely decrease by 5 mg weekly until off. -Chest CT from 09/16/2024 personally reviewed shows moderate size pericardial effusion  Chronic systolic heart failure - EF 40 to 45% on echocardiogram mildly reduced with volume management per hemodialysis.  Continue with carvedilol  and irbesartan .  Leukocytosis - 26,000, likely in part from prednisone    For questions or updates, please contact [facility] Please consult [email or web address] for contact info under         Signed, Oneil Parchment, MD   "

## 2024-10-02 NOTE — Progress Notes (Signed)
 Came bedside for echo, but patient is in dialysis at this time.

## 2024-10-02 NOTE — Progress Notes (Signed)
" °   10/02/24 1213  Vitals  Temp 97.6 F (36.4 C)  Pulse Rate 73  Resp (!) 21  BP (!) 123/98  SpO2 98 %  O2 Device Room Air  Weight 68.5 kg  Type of Weight Post-Dialysis  Oxygen Therapy  Patient Activity (if Appropriate) In bed  Pulse Oximetry Type Continuous  Oximetry Probe Site Changed No  Post Treatment  Dialyzer Clearance Lightly streaked  Hemodialysis Intake (mL) 0 mL  Liters Processed 70.5  Fluid Removed (mL) 2800 mL  Tolerated HD Treatment Yes  Post-Hemodialysis Comments Pt. stated that she is having abdominal pain and SOB but refused any pain medications. Hooked to 02 at 2LPM via nasal cannula    "

## 2024-10-03 LAB — CBC
HCT: 31 % — ABNORMAL LOW (ref 36.0–46.0)
Hemoglobin: 9.3 g/dL — ABNORMAL LOW (ref 12.0–15.0)
MCH: 26.7 pg (ref 26.0–34.0)
MCHC: 30 g/dL (ref 30.0–36.0)
MCV: 89.1 fL (ref 80.0–100.0)
Platelets: 480 10*3/uL — ABNORMAL HIGH (ref 150–400)
RBC: 3.48 MIL/uL — ABNORMAL LOW (ref 3.87–5.11)
RDW: 21.2 % — ABNORMAL HIGH (ref 11.5–15.5)
WBC: 22.4 10*3/uL — ABNORMAL HIGH (ref 4.0–10.5)
nRBC: 0 % (ref 0.0–0.2)

## 2024-10-03 LAB — BASIC METABOLIC PANEL WITH GFR
Anion gap: 14 (ref 5–15)
BUN: 47 mg/dL — ABNORMAL HIGH (ref 6–20)
CO2: 27 mmol/L (ref 22–32)
Calcium: 10.4 mg/dL — ABNORMAL HIGH (ref 8.9–10.3)
Chloride: 93 mmol/L — ABNORMAL LOW (ref 98–111)
Creatinine, Ser: 4.42 mg/dL — ABNORMAL HIGH (ref 0.44–1.00)
GFR, Estimated: 13 mL/min — ABNORMAL LOW
Glucose, Bld: 111 mg/dL — ABNORMAL HIGH (ref 70–99)
Potassium: 4.8 mmol/L (ref 3.5–5.1)
Sodium: 133 mmol/L — ABNORMAL LOW (ref 135–145)

## 2024-10-03 NOTE — Progress Notes (Signed)
 " [facility] Progress Note   Subjective:  Completed dialysis yesterday-- net UF 2.8L  Seen in room. Cardiology at bedside.  Remains with intermittent pleuritic chest pain   Objective Vitals:   10/02/24 1633 10/02/24 2102 10/03/24 0506 10/03/24 0803  BP: (!) 124/94 (!) 136/91 (!) 131/91 123/88  Pulse: 71 70 65 69  Resp: 19 18 18 19   Temp: 98.3 F (36.8 C) 98 F (36.7 C) 98 F (36.7 C) 98.2 F (36.8 C)  TempSrc:      SpO2: 99% 100% 98% 100%  Weight:      Height:       Physical Exam General: Alert, nad  Lungs: Clear, normal wob on RA CV: RRR, rub  Abdomen: non-distended Extremities: no LE edema, L arm edema improved Dialysis Access: [facility]  Additional Objective Labs: Basic Metabolic Panel: Recent Labs  Lab 09/27/24 0830 09/29/24 0508 09/30/24 0508 10/01/24 0458 10/02/24 0305 10/03/24 0351  NA 132* 132*   < > 134* 135 133*  K 5.4* 4.5   < > 4.6 4.6 4.8  CL 91* 89*   < > 93* 91* 93*  CO2 25 25   < > 26 23 27   GLUCOSE 116* 110*   < > 117* 107* 111*  BUN 48* 48*   < > 56* 72* 47*  CREATININE 6.09* 5.98*   < > 5.43* 6.40* 4.42*  CALCIUM  10.4* 10.5*   < > 10.6* 10.4* 10.4*  PHOS 7.3* 5.7*  --   --   --   --    < > = values in this interval not displayed.   Liver Function Tests: Recent Labs  Lab 09/27/24 0830 09/29/24 0508  ALBUMIN  3.2* 3.2*   No results for input(s): LIPASE, AMYLASE in the last 168 hours. CBC: Recent Labs  Lab 09/29/24 0508 09/30/24 0508 10/01/24 0458 10/02/24 0305 10/03/24 0351  WBC 23.5* 19.5* 26.4* 27.8* 22.4*  NEUTROABS  --  17.0*  --   --   --   HGB 8.3* 8.7* 9.2* 9.8* 9.3*  HCT 26.7* 28.2* 30.7* 31.8* 31.0*  MCV 88.1 88.4 89.8 89.1 89.1  PLT 382 383 415* 475* 480*   Blood Culture    Component Value Date/Time   SDES BLOOD RIGHT HAND 09/16/2024 1326   SPECREQUEST  09/16/2024 1326    BOTTLES DRAWN AEROBIC ONLY Blood Culture results may not be optimal due to an inadequate volume of blood received in culture  bottles   CULT  09/16/2024 1326    NO GROWTH 5 DAYS Performed at [facility], [address] [place], [place] [address]    REPTSTATUS 09/21/2024 FINAL 09/16/2024 1326    Cardiac Enzymes: No results for input(s): CKTOTAL, CKMB, CKMBINDEX, TROPONINI in the last 168 hours. CBG: No results for input(s): GLUCAP in the last 168 hours. Iron  Studies: No results for input(s): IRON , TIBC, TRANSFERRIN, FERRITIN in the last 72 hours. @lablastinr3 @ Studies/Results: ECHOCARDIOGRAM LIMITED Result Date: 10/02/2024    ECHOCARDIOGRAM LIMITED REPORT   Patient Name:   KARLIE AUNG Roma Date of Exam: 10/02/2024 Medical Rec #:  [id number]             Height:       66.0 in Accession #:    [id number]            Weight:       151.0 lb Date of Birth:  1995/12/20             BSA:  1.775 m Patient Age:    29 years              BP:           132/89 mmHg Patient Gender: F                     HR:           67 bpm. Exam Location:  Inpatient Procedure: Limited Echo and Limited Color Doppler (Both Spectral and Color Flow            Doppler were utilized during procedure). Indications:    Pericardial effusion I31.3  History:        Patient has prior history of Echocardiogram examinations, most                 recent 09/29/2024.  Sonographer:    Sydnee Wilson RDCS Referring Phys: [id number] CHRISTOPHER L SCHUMANN IMPRESSIONS  1. Left ventricular ejection fraction, by estimation, is 45 to 50%. The left ventricle has mildly decreased function. There is moderate concentric left ventricular hypertrophy.  2. Relatively stable . Large pericardial effusion. There is no evidence of cardiac tamponade.  3. There is normal pulmonary artery systolic pressure.  4. The inferior vena cava is normal in size with greater than 50% respiratory variability, suggesting right atrial pressure of 3 mmHg. FINDINGS  Left Ventricle: Left ventricular ejection fraction, by estimation, is 45 to 50%. The left ventricle has mildly  decreased function. There is moderate concentric left ventricular hypertrophy. Right Ventricle: There is normal pulmonary artery systolic pressure. The tricuspid regurgitant velocity is 2.78 m/s, and with an assumed right atrial pressure of 3 mmHg, the estimated right ventricular systolic pressure is 33.9 mmHg. Pericardium: Relatively stable. A large pericardial effusion is present. There is no evidence of cardiac tamponade. Venous: The inferior vena cava is normal in size with greater than 50% respiratory variability, suggesting right atrial pressure of 3 mmHg.  LV Volumes (MOD) LV vol d, MOD A2C: 125.0 ml LV vol d, MOD A4C: 151.5 ml LV vol s, MOD A2C: 58.2 ml LV vol s, MOD A4C: 55.1 ml LV SV MOD A2C:     66.8 ml LV SV MOD A4C:     151.5 ml LV SV MOD BP:      80.2 ml TRICUSPID VALVE TR Peak grad:   30.9 mmHg TR Vmax:        278.00 cm/s Morene Brownie Electronically signed by Morene Brownie Signature Date/Time: 10/02/2024/7:37:02 PM    Final     Medications:    amLODipine   10 mg Oral QHS   carvedilol   25 mg Oral BID WC   Chlorhexidine  Gluconate Cloth  6 each Topical Q0600   Chlorhexidine  Gluconate Cloth  6 each Topical Q0600   cloNIDine   0.1 mg Oral Daily   darbepoetin (ARANESP ) injection - DIALYSIS  200 mcg Subcutaneous Q Sun-1800   diclofenac  Sodium  2 g Topical QID   furosemide   80 mg Oral Daily   irbesartan   300 mg Oral Daily   pantoprazole   40 mg Oral Daily   predniSONE   40 mg Oral Q breakfast   sevelamer  carbonate  2.4 g Oral TID WC    Dialysis Orders: MWF - [facility] 3.5hrs, BFR 400, DFR 500,  EDW 68.4kg, 2K/ 2Ca, Heparin  3000 units bolus - Mircera 225 mcg q2wks - last 09/11/24 - Hectorol  5mcg IV qHD - last 09/13/24 - Sensipar  90mg  with HD - last 09/13/24  Assessment/Plan: Pericardial  effusion - enlarging effusion.  No tamponade on Echo. Prednisone  started. Cardiology following -pericardiocentesis deferred 1/30 - felt to be improving but patient remains symptomatic.  Repeat Echo 2/2 - large pericardial effusion.  AVF malfunction - VVS following. Hx multiple revisions in the past, last 1/7. S/p ligation 1/16. S/p I&D with wound vac placement 1/20.  ESRD -  on HD MWF. Next HD Wed. Consider increase Rx time for pericarditis. Unfortunately very high inpatient dialysis census currently.  Hypertension/volume  - BP meds resumed. UF as tolerated with HD as above HFrEF - max volume removal with HD as tolerated  Anemia of CKD - Hgb 9.3 on high dose ESA Secondary Hyperparathyroidism -  Ca ok, Phos better - restarted binders (sevelamer ). Stopped hectorol .  Nutrition - Renal diet with fluid restriction GERD - Had been getting sucralfate , stopped. Continue PPI  Selso Mannor Ronnald Acosta PA-C [facility] 10/03/2024,9:38 AM   "

## 2024-10-03 NOTE — Plan of Care (Signed)
  Problem: Health Behavior/Discharge Planning: Goal: Ability to manage health-related needs will improve Outcome: Progressing   Problem: Clinical Measurements: Goal: Will remain free from infection Outcome: Progressing Goal: Respiratory complications will improve Outcome: Progressing Goal: Cardiovascular complication will be avoided Outcome: Progressing   Problem: Activity: Goal: Risk for activity intolerance will decrease Outcome: Progressing   Problem: Nutrition: Goal: Adequate nutrition will be maintained Outcome: Progressing   Problem: Coping: Goal: Level of anxiety will decrease Outcome: Progressing

## 2024-10-03 NOTE — Progress Notes (Signed)
 "  PROGRESS NOTE    Linda [name]  FMW:[id number] DOB: 30-Aug-1996 DOA: 09/13/2024 PCP: [name], NP   Brief Narrative: Linda [name] is a 29 y.o. female with a history of hypertension, G6PD deficiency, CHF, lupus, ESRD on HD.  Patient presented secondary to left arm swelling in setting of recent AV fistula surgery and recent wound dehiscence. Vascular surgery consulted and performed AV fistula washout with wound vac placement. Patient with significant pleuritic chest pain. Workup significant for worsened pericardial effusion with symptoms consistent with pericarditis. Prednisone  with taper started.   Assessment and Plan:  Left upper extremity edema In setting of recent AV fistula surgery. Resultant dehiscence of wound. Vascular surgery consulted and performed a left arm brachiobasilic fistula ligation with I&D and placement of a wound vac. No growth on wound culture. - Vascular surgery recommendations: pending today (2/3)  Pericardial effusion Noted on Transthoracic Echocardiogram. No clinical evidence of tamponade. Repeat limited echo performed on 1/28 and is significant for a significant enlargement of the effusion. Cardiology consulted with initial plan for a pericardiocentesis on 1/30, which was deferred secondary to posterior fluid location. Repeat Transthoracic Echocardiogram (2/2) is significant for persistent effusion. - Cardiology recommendations: cardiothoracic surgery consultation for consideration of pericardial window  Pericarditis Patient with troponin of 118 with negative delta of 112. Echo with slightly improved LVEF of 40-45% with global hypokinesis. Cardiology was consulted for workup/management. Symptoms appeared to improved with ongoing treatment for volume overload. Prednisone  started for concern for musculoskeletal component. Patient started on prednisone  for likely musculoskeletal etiology, however symptoms returned after taper. Per cardiology,  etiology consistent with pericarditis; possibly related to lupus vs uremia. - Cardiology recommendations: prednisone  with taper  Dyspnea on exertion Concern this could be related to patient's pericardial effusion vs fluid overload. Last hemodialysis was 1/28. Chest x-ray confirms known cardiomegaly. No hypoxia with ambulation. Symptoms appear to be improved. Patient able to ambulate down to cafeteria and back without symptoms  ESRD on HD MWF schedule. Nephrology consulted for ongoing hemodialysis needs.  Hyperkalemia Patient given Lokelma . Continued management per nephrology with hemodialysis.  Leukocytosis Appears to correlate with initiation of prednisone . Some improvement after prednisone  was discontinued, but is now increased again after restarting prednisone . Afebrile.  Acute on chronic HFrEF Managed via hemodialysis.  GERD - Continue Protonix  and sucralfate   Primary hypertension - Continue amlodipine , carvedilol , clonidine  and Lasix   Hyponatremia Secondary to volume overload. Management per HD.  Vitamin D deficiency Patient started on doxercalciferol  with HD.   DVT prophylaxis: SCDs Code Status:   Code Status: Full Code Family Communication: None at bedside Disposition Plan: Discharge pending ongoing vascular surgery and cardiology recommendations   Consultants:  Vascular surgery Cardiology Cardiothoracic surgery  Procedures:  Transthoracic Echocardiogram  Antimicrobials: Cefepime  Vancomycin     Subjective: Patient continues with intermittent, positional chest pain.  Objective: BP 123/88 (BP Location: Right Arm)   Pulse 69   Temp 98.2 F (36.8 C)   Resp 19   Ht 5' 6 (1.676 m)   Wt 68.5 kg   LMP  (LMP Unknown)   SpO2 100%   BMI 24.37 kg/m   Examination:  General exam: Appears calm and comfortable. Respiratory system: Respiratory effort normal.   Data Reviewed: I have personally reviewed following labs and imaging studies  CBC Lab Results   Component Value Date   WBC 22.4 (H) 10/03/2024   RBC 3.48 (L) 10/03/2024   HGB 9.3 (L) 10/03/2024   HCT 31.0 (L) 10/03/2024   MCV 89.1 10/03/2024  MCH 26.7 10/03/2024   PLT 480 (H) 10/03/2024   MCHC 30.0 10/03/2024   RDW 21.2 (H) 10/03/2024   LYMPHSABS 1.0 09/30/2024   MONOABS 1.3 (H) 09/30/2024   EOSABS 0.0 09/30/2024   BASOSABS 0.0 09/30/2024     Last metabolic panel Lab Results  Component Value Date   NA 133 (L) 10/03/2024   K 4.8 10/03/2024   CL 93 (L) 10/03/2024   CO2 27 10/03/2024   BUN 47 (H) 10/03/2024   CREATININE 4.42 (H) 10/03/2024   GLUCOSE 111 (H) 10/03/2024   GFRNONAA 13 (L) 10/03/2024   GFRAA 19 (L) 03/27/2019   CALCIUM  10.4 (H) 10/03/2024   PHOS 5.7 (H) 09/29/2024   PROT 7.9 09/13/2024   ALBUMIN  3.2 (L) 09/29/2024   BILITOT 1.1 09/13/2024   ALKPHOS 174 09/13/2024   AST 26 09/13/2024   ALT 7 09/13/2024   ANIONGAP 14 10/03/2024    GFR: Estimated Creatinine Clearance: 17.6 mL/min (A) (by C-G formula based on SCr of 4.42 mg/dL (H)).  No results found for this or any previous visit (from the past 240 hours).    Radiology Studies: ECHOCARDIOGRAM LIMITED Result Date: 10/02/2024    ECHOCARDIOGRAM LIMITED REPORT   Patient Name:   Linda [name] Date of Exam: 10/02/2024 Medical Rec #:  [id number]             Height:       66.0 in Accession #:    [id number]            Weight:       151.0 lb Date of Birth:  01-26-1996             BSA:          1.775 m Patient Age:    28 years              BP:           132/89 mmHg Patient Gender: F                     HR:           67 bpm. Exam Location:  Inpatient Procedure: Limited Echo and Limited Color Doppler (Both Spectral and Color Flow            Doppler were utilized during procedure). Indications:    Pericardial effusion I31.3  History:        Patient has prior history of Echocardiogram examinations, most                 recent 09/29/2024.  Sonographer:    Sydnee Wilson RDCS Referring Phys: [id number] CHRISTOPHER L  SCHUMANN IMPRESSIONS  1. Left ventricular ejection fraction, by estimation, is 45 to 50%. The left ventricle has mildly decreased function. There is moderate concentric left ventricular hypertrophy.  2. Relatively stable . Large pericardial effusion. There is no evidence of cardiac tamponade.  3. There is normal pulmonary artery systolic pressure.  4. The inferior vena cava is normal in size with greater than 50% respiratory variability, suggesting right atrial pressure of 3 mmHg. FINDINGS  Left Ventricle: Left ventricular ejection fraction, by estimation, is 45 to 50%. The left ventricle has mildly decreased function. There is moderate concentric left ventricular hypertrophy. Right Ventricle: There is normal pulmonary artery systolic pressure. The tricuspid regurgitant velocity is 2.78 m/s, and with an assumed right atrial pressure of 3 mmHg, the estimated right ventricular systolic pressure is 33.9 mmHg. Pericardium: Relatively stable. A large pericardial  effusion is present. There is no evidence of cardiac tamponade. Venous: The inferior vena cava is normal in size with greater than 50% respiratory variability, suggesting right atrial pressure of 3 mmHg.  LV Volumes (MOD) LV vol d, MOD A2C: 125.0 ml LV vol d, MOD A4C: 151.5 ml LV vol s, MOD A2C: 58.2 ml LV vol s, MOD A4C: 55.1 ml LV SV MOD A2C:     66.8 ml LV SV MOD A4C:     151.5 ml LV SV MOD BP:      80.2 ml TRICUSPID VALVE TR Peak grad:   30.9 mmHg TR Vmax:        278.00 cm/s Morene Brownie Electronically signed by Morene Brownie Signature Date/Time: 10/02/2024/7:37:02 PM    Final        LOS: 19 days    Elgin Lam, MD [facility] 10/03/2024, 1:32 PM   If 7PM-7AM, please contact night-coverage [email or web address]  "

## 2024-10-04 LAB — CBC
HCT: 30.4 % — ABNORMAL LOW (ref 36.0–46.0)
Hemoglobin: 9 g/dL — ABNORMAL LOW (ref 12.0–15.0)
MCH: 26.6 pg (ref 26.0–34.0)
MCHC: 29.6 g/dL — ABNORMAL LOW (ref 30.0–36.0)
MCV: 89.9 fL (ref 80.0–100.0)
Platelets: 477 10*3/uL — ABNORMAL HIGH (ref 150–400)
RBC: 3.38 MIL/uL — ABNORMAL LOW (ref 3.87–5.11)
RDW: 21.3 % — ABNORMAL HIGH (ref 11.5–15.5)
WBC: 25.3 10*3/uL — ABNORMAL HIGH (ref 4.0–10.5)
nRBC: 0 % (ref 0.0–0.2)

## 2024-10-04 LAB — RENAL FUNCTION PANEL
Albumin: 3.2 g/dL — ABNORMAL LOW (ref 3.5–5.0)
Anion gap: 16 — ABNORMAL HIGH (ref 5–15)
BUN: 73 mg/dL — ABNORMAL HIGH (ref 6–20)
CO2: 26 mmol/L (ref 22–32)
Calcium: 10.5 mg/dL — ABNORMAL HIGH (ref 8.9–10.3)
Chloride: 92 mmol/L — ABNORMAL LOW (ref 98–111)
Creatinine, Ser: 5.91 mg/dL — ABNORMAL HIGH (ref 0.44–1.00)
GFR, Estimated: 9 mL/min — ABNORMAL LOW
Glucose, Bld: 100 mg/dL — ABNORMAL HIGH (ref 70–99)
Phosphorus: 3.9 mg/dL (ref 2.5–4.6)
Potassium: 5.4 mmol/L — ABNORMAL HIGH (ref 3.5–5.1)
Sodium: 133 mmol/L — ABNORMAL LOW (ref 135–145)

## 2024-10-04 MED ORDER — HYDROMORPHONE HCL 1 MG/ML IJ SOLN
0.5000 mg | Freq: Once | INTRAMUSCULAR | Status: AC
  Administered 2024-10-04: 0.5 mg via INTRAVENOUS
  Filled 2024-10-04: qty 0.5

## 2024-10-04 MED ORDER — HEPARIN SODIUM (PORCINE) 1000 UNIT/ML DIALYSIS
1000.0000 [IU] | INTRAMUSCULAR | Status: AC | PRN
  Administered 2024-10-04: 1000 [IU]
  Filled 2024-10-04: qty 1

## 2024-10-04 MED ORDER — HYDROMORPHONE HCL 1 MG/ML IJ SOLN
0.5000 mg | INTRAMUSCULAR | Status: AC | PRN
  Administered 2024-10-04 – 2024-10-06 (×10): 0.5 mg via INTRAVENOUS
  Filled 2024-10-04 (×8): qty 0.5

## 2024-10-04 MED ORDER — HEPARIN SODIUM (PORCINE) 1000 UNIT/ML IJ SOLN
INTRAMUSCULAR | Status: AC
  Filled 2024-10-04: qty 2

## 2024-10-04 MED ORDER — HEPARIN SODIUM (PORCINE) 1000 UNIT/ML DIALYSIS
20.0000 [IU]/kg | INTRAMUSCULAR | Status: AC | PRN
  Administered 2024-10-04: 1400 [IU] via INTRAVENOUS_CENTRAL
  Filled 2024-10-04: qty 2

## 2024-10-04 MED ORDER — ALTEPLASE 2 MG IJ SOLR: 2.0000 mg | Freq: Once | INTRAMUSCULAR | Status: AC | PRN

## 2024-10-04 MED ORDER — LIDOCAINE HCL (PF) 1 % IJ SOLN: 5.0000 mL | INTRAMUSCULAR | Status: AC | PRN

## 2024-10-04 MED ORDER — LIDOCAINE-PRILOCAINE 2.5-2.5 % EX CREA: 1.0000 | TOPICAL_CREAM | CUTANEOUS | Status: AC | PRN

## 2024-10-04 MED ORDER — PENTAFLUOROPROP-TETRAFLUOROETH EX AERO: 1.0000 | INHALATION_SPRAY | CUTANEOUS | Status: AC | PRN

## 2024-10-04 MED ORDER — ANTICOAGULANT SODIUM CITRATE 4% (200MG/5ML) IV SOLN: 5.0000 mL | Status: AC | PRN

## 2024-10-04 NOTE — Plan of Care (Signed)
" °  Problem: Health Behavior/Discharge Planning: Goal: Ability to manage health-related needs will improve Outcome: Not Progressing   Problem: Clinical Measurements: Goal: Will remain free from infection Outcome: Not Progressing Goal: Respiratory complications will improve Outcome: Not Progressing Goal: Cardiovascular complication will be avoided Outcome: Not Progressing   Problem: Activity: Goal: Risk for activity intolerance will decrease Outcome: Not Progressing   Problem: Nutrition: Goal: Adequate nutrition will be maintained Outcome: Not Progressing   Problem: Coping: Goal: Level of anxiety will decrease Outcome: Not Progressing   Problem: Elimination: Goal: Will not experience complications related to bowel motility Outcome: Not Progressing Goal: Will not experience complications related to urinary retention Outcome: Not Progressing   Problem: Pain Managment: Goal: General experience of comfort will improve and/or be controlled Outcome: Not Progressing   Problem: Safety: Goal: Ability to remain free from injury will improve Outcome: Not Progressing   Problem: Skin Integrity: Goal: Risk for impaired skin integrity will decrease Outcome: Not Progressing   Problem: Education: Goal: Knowledge of the prescribed therapeutic regimen will improve Outcome: Not Progressing   Problem: Bowel/Gastric: Goal: Gastrointestinal status for postoperative course will improve Outcome: Not Progressing   Problem: Cardiac: Goal: Ability to maintain an adequate cardiac output Outcome: Not Progressing Goal: Will show no evidence of cardiac arrhythmias Outcome: Not Progressing   Problem: Nutritional: Goal: Will attain and maintain optimal nutritional status Outcome: Not Progressing   Problem: Neurological: Goal: Will regain or maintain usual level of consciousness Outcome: Not Progressing   Problem: Clinical Measurements: Goal: Ability to maintain clinical measurements  within normal limits Outcome: Not Progressing Goal: Postoperative complications will be avoided or minimized Outcome: Not Progressing   Problem: Respiratory: Goal: Will regain and/or maintain adequate ventilation Outcome: Not Progressing Goal: Respiratory status will improve Outcome: Not Progressing   Problem: Skin Integrity: Goal: Demonstrates signs of wound healing without infection Outcome: Not Progressing   Problem: Urinary Elimination: Goal: Will remain free from infection Outcome: Not Progressing Goal: Ability to achieve and maintain adequate urine output Outcome: Not Progressing   Problem: Fluid Volume: Goal: Compliance with measures to maintain balanced fluid volume will improve Outcome: Not Progressing   Problem: Health Behavior/Discharge Planning: Goal: Ability to manage health-related needs will improve Outcome: Not Progressing   Problem: Nutritional: Goal: Ability to make healthy dietary choices will improve Outcome: Not Progressing   Problem: Clinical Measurements: Goal: Complications related to the disease process, condition or treatment will be avoided or minimized Outcome: Not Progressing   "

## 2024-10-04 NOTE — Assessment & Plan Note (Signed)
 Continue pantoprazole 

## 2024-10-04 NOTE — Assessment & Plan Note (Signed)
 Improved volume status, management through ultrafiltration

## 2024-10-04 NOTE — Plan of Care (Signed)
  Problem: Health Behavior/Discharge Planning: Goal: Ability to manage health-related needs will improve Outcome: Progressing   Problem: Clinical Measurements: Goal: Will remain free from infection Outcome: Progressing Goal: Respiratory complications will improve Outcome: Progressing Goal: Cardiovascular complication will be avoided Outcome: Progressing   Problem: Activity: Goal: Risk for activity intolerance will decrease Outcome: Progressing   Problem: Nutrition: Goal: Adequate nutrition will be maintained Outcome: Progressing   
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
## 2024-10-04 NOTE — Progress Notes (Addendum)
" °  Progress Note   Patient: Linda [name] FMW:[id number] DOB: [birth year] DOA: 09/13/2024     20 DOS: the patient was seen and examined on 10/04/2024   Brief hospital course: Linda [name] is a 29 y.o. female with a history of hypertension, G6PD deficiency, CHF, lupus, ESRD on HD.  Patient presented secondary to left arm swelling in setting of recent AV fistula surgery and recent wound dehiscence. Vascular surgery consulted and performed AV fistula washout with wound vac placement. Patient with significant pleuritic chest pain. Workup significant for worsened pericardial effusion with symptoms consistent with pericarditis. Prednisone  with taper started.  Assessment and Plan: ESRD (end stage renal disease) (HCC) On hemodialysis Hyperkalemia hyponatremia  Recent AV fistula, resultant dehiscence of wound.  Sp left arm brachio basilic fistula ligation with I&D, and placement of wound VAC.  Continue renal replacement therapy and ultrafiltration through tunneled catheter.   Anemia of chronic renal disease, continue with EPO     Acute on chronic left systolic heart failure (HCC) Improved volume status, management through ultrafiltration   Essential hypertension Continue blood pressure monitoring  Continue with carvedilol , clonidine  and irbesartan    Acute pericarditis Patient with troponin of 118 with negative delta of 112. Echo with slightly improved LVEF of 40-45% with global hypokinesis. Cardiology was consulted for workup/management. Symptoms appeared to improved with ongoing treatment for volume overload. Prednisone  started for concern for musculoskeletal component. Patient started on prednisone  for likely musculoskeletal etiology, however symptoms returned after taper. Per cardiology, etiology consistent with pericarditis; possibly related to lupus vs uremia. - Cardiology recommendations: prednisone  with taper  Repeat limited echo performed on 1/28 and is significant for a  significant enlargement of the effusion. Cardiology consulted with initial plan for a pericardiocentesis on 1/30, which was deferred secondary to posterior fluid location. Repeat Transthoracic Echocardiogram (2/2) is significant for persistent effusion. - Cardiology recommendations: cardiothoracic surgery consultation for consideration of pericardial window Procedure scheduled for tomorrow    GERD without esophagitis Continue pantoprazole          Subjective: Patient with no nausea or vomiting, no dyspnea, continue to have uncontrolled pain, mainly at night  Physical Exam: Vitals:   10/04/24 1530 10/04/24 1545 10/04/24 1600 10/04/24 1618  BP: (!) 137/97 (!) 122/98 (!) 133/95 (!) 126/93  Pulse: 78 69 72 (!) 7  Resp: 18 20 17 16   Temp:    (P) 97.7 F (36.5 C)  TempSrc:    (P) Axillary  SpO2:    (P) 98%  Weight:    (P) 71 kg  Height:       Neurology awake and alert ENT with mild pallor with no icterus Cardiovascular with S1 and S2 present and regular, no rubs or murmurs Respiratory with no rales or wheezing, no rhonchi Abdomen with no distention  No lower extremity edema   Data Reviewed:    Family Communication: no family at the bedside   Disposition: Status is: Inpatient Remains inpatient appropriate because: plan for pericardial window   Planned Discharge Destination: Home    \  Author: Elidia Toribio Furnace, MD 10/04/2024 4:26 PM  For on call review [email or web address].  "

## 2024-10-04 NOTE — Assessment & Plan Note (Addendum)
 On hemodialysis Hyperkalemia hyponatremia  Recent AV fistula, resultant dehiscence of wound.  Sp left arm brachio basilic fistula ligation with I&D, and placement of wound VAC.  Continue renal replacement therapy and ultrafiltration through tunneled catheter.   Anemia of chronic renal disease, continue with EPO

## 2024-10-04 NOTE — Progress Notes (Signed)
 CT surgery consult reviewed. Plan for pericardial window Thursday.  Appreciate their assistance.   Oneil Parchment, MD

## 2024-10-04 NOTE — Progress Notes (Signed)
 " [facility] Progress Note   Subjective:  Seen in room. Feels about the same. Intermittent pleuritic chest pain.   Objective Vitals:   10/04/24 1345 10/04/24 1400 10/04/24 1415 10/04/24 1430  BP: (!) 132/100 (!) 131/103 (!) 134/105 (!) 131/101  Pulse: 71 69 62 69  Resp: (!) 21 14 18  (!) 21  Temp:      TempSrc:      SpO2:      Weight:      Height:       Physical Exam General: Alert, nad  Lungs: Clear, normal wob on RA CV: RRR, rub  Abdomen: non-distended Extremities: no LE edema, L arm edema improved Dialysis Access: [facility]  Additional Objective Labs: Basic Metabolic Panel: Recent Labs  Lab 09/29/24 0508 09/30/24 0508 10/02/24 0305 10/03/24 0351 10/04/24 0456  NA 132*   < > 135 133* 133*  K 4.5   < > 4.6 4.8 5.4*  CL 89*   < > 91* 93* 92*  CO2 25   < > 23 27 26   GLUCOSE 110*   < > 107* 111* 100*  BUN 48*   < > 72* 47* 73*  CREATININE 5.98*   < > 6.40* 4.42* 5.91*  CALCIUM  10.5*   < > 10.4* 10.4* 10.5*  PHOS 5.7*  --   --   --  3.9   < > = values in this interval not displayed.   Liver Function Tests: Recent Labs  Lab 09/29/24 0508 10/04/24 0456  ALBUMIN  3.2* 3.2*   No results for input(s): LIPASE, AMYLASE in the last 168 hours. CBC: Recent Labs  Lab 09/30/24 0508 10/01/24 0458 10/02/24 0305 10/03/24 0351 10/04/24 0456  WBC 19.5* 26.4* 27.8* 22.4* 25.3*  NEUTROABS 17.0*  --   --   --   --   HGB 8.7* 9.2* 9.8* 9.3* 9.0*  HCT 28.2* 30.7* 31.8* 31.0* 30.4*  MCV 88.4 89.8 89.1 89.1 89.9  PLT 383 415* 475* 480* 477*   Blood Culture    Component Value Date/Time   SDES BLOOD RIGHT HAND 09/16/2024 1326   SPECREQUEST  09/16/2024 1326    BOTTLES DRAWN AEROBIC ONLY Blood Culture results may not be optimal due to an inadequate volume of blood received in culture bottles   CULT  09/16/2024 1326    NO GROWTH 5 DAYS Performed at [facility], [address], [place], [place] [address]    REPTSTATUS 09/21/2024 FINAL 09/16/2024 1326     Cardiac Enzymes: No results for input(s): CKTOTAL, CKMB, CKMBINDEX, TROPONINI in the last 168 hours. CBG: No results for input(s): GLUCAP in the last 168 hours. Iron  Studies: No results for input(s): IRON , TIBC, TRANSFERRIN, FERRITIN in the last 72 hours. @lablastinr3 @ Studies/Results: ECHOCARDIOGRAM LIMITED Result Date: 10/02/2024    ECHOCARDIOGRAM LIMITED REPORT   Patient Name:   Linda [name] Date of Exam: 10/02/2024 Medical Rec #:  [id number]             Height:       66.0 in Accession #:    [id number]            Weight:       151.0 lb Date of Birth:  1995-11-07             BSA:          1.775 m Patient Age:    28 years              BP:  132/89 mmHg Patient Gender: F                     HR:           67 bpm. Exam Location:  Inpatient Procedure: Limited Echo and Limited Color Doppler (Both Spectral and Color Flow            Doppler were utilized during procedure). Indications:    Pericardial effusion I31.3  History:        Patient has prior history of Echocardiogram examinations, most                 recent 09/29/2024.  Sonographer:    Sydnee Wilson RDCS Referring Phys: [id number] CHRISTOPHER L SCHUMANN IMPRESSIONS  1. Left ventricular ejection fraction, by estimation, is 45 to 50%. The left ventricle has mildly decreased function. There is moderate concentric left ventricular hypertrophy.  2. Relatively stable . Large pericardial effusion. There is no evidence of cardiac tamponade.  3. There is normal pulmonary artery systolic pressure.  4. The inferior vena cava is normal in size with greater than 50% respiratory variability, suggesting right atrial pressure of 3 mmHg. FINDINGS  Left Ventricle: Left ventricular ejection fraction, by estimation, is 45 to 50%. The left ventricle has mildly decreased function. There is moderate concentric left ventricular hypertrophy. Right Ventricle: There is normal pulmonary artery systolic pressure. The tricuspid regurgitant velocity is  2.78 m/s, and with an assumed right atrial pressure of 3 mmHg, the estimated right ventricular systolic pressure is 33.9 mmHg. Pericardium: Relatively stable. A large pericardial effusion is present. There is no evidence of cardiac tamponade. Venous: The inferior vena cava is normal in size with greater than 50% respiratory variability, suggesting right atrial pressure of 3 mmHg.  LV Volumes (MOD) LV vol d, MOD A2C: 125.0 ml LV vol d, MOD A4C: 151.5 ml LV vol s, MOD A2C: 58.2 ml LV vol s, MOD A4C: 55.1 ml LV SV MOD A2C:     66.8 ml LV SV MOD A4C:     151.5 ml LV SV MOD BP:      80.2 ml TRICUSPID VALVE TR Peak grad:   30.9 mmHg TR Vmax:        278.00 cm/s Morene Brownie Electronically signed by Morene Brownie Signature Date/Time: 10/02/2024/7:37:02 PM    Final     Medications:  anticoagulant sodium citrate        amLODipine   10 mg Oral QHS   carvedilol   25 mg Oral BID WC   Chlorhexidine  Gluconate Cloth  6 each Topical Q0600   cloNIDine   0.1 mg Oral Daily   darbepoetin (ARANESP ) injection - DIALYSIS  200 mcg Subcutaneous Q Sun-1800   diclofenac  Sodium  2 g Topical QID   furosemide   80 mg Oral Daily   irbesartan   300 mg Oral Daily   pantoprazole   40 mg Oral Daily   predniSONE   40 mg Oral Q breakfast   sevelamer  carbonate  2.4 g Oral TID WC    Dialysis Orders: MWF - [facility] 3.5hrs, BFR 400, DFR 500,  EDW 68.4kg, 2K/ 2Ca, Heparin  3000 units bolus - Mircera 225 mcg q2wks - last 09/11/24 - Hectorol  5mcg IV qHD - last 09/13/24 - Sensipar  90mg  with HD - last 09/13/24  Assessment/Plan: Pericardial effusion - enlarging effusion.  No tamponade on Echo. Prednisone  started. Cardiology following -pericardiocentesis deferred 1/30 - felt to be improving but patient remains symptomatic. Repeat Echo 2/2 - large pericardial effusion. CTS consulted - planning  for pericardial window Thurs.  AVF malfunction - VVS following. Hx multiple revisions in the past, last 1/7. S/p ligation 1/16. S/p I&D  with wound vac placement 1/20.  ESRD -  on HD MWF. Next HD Wed. Consider increase Rx time for pericarditis. Unfortunately very high inpatient dialysis census currently.  Hypertension/volume  - BP meds resumed. UF as tolerated with HD as above HFrEF - max volume removal with HD as tolerated  Anemia of CKD - Hgb 9.3 on high dose ESA Secondary Hyperparathyroidism -  Ca elevated Phos better - restarted binders (sevelamer ). Stopped hectorol .  Nutrition - Renal diet with fluid restriction GERD - Had been getting sucralfate , stopped. Continue PPI  Colbin Jovel Ronnald Acosta PA-C [facility] 10/04/2024,2:39 PM   "

## 2024-10-04 NOTE — Assessment & Plan Note (Addendum)
 Continue blood pressure monitoring  Continue with carvedilol , clonidine  and irbesartan 

## 2024-10-04 NOTE — Assessment & Plan Note (Signed)
 Patient with troponin of 118 with negative delta of 112. Echo with slightly improved LVEF of 40-45% with global hypokinesis. Cardiology was consulted for workup/management. Symptoms appeared to improved with ongoing treatment for volume overload. Prednisone  started for concern for musculoskeletal component. Patient started on prednisone  for likely musculoskeletal etiology, however symptoms returned after taper. Per cardiology, etiology consistent with pericarditis; possibly related to lupus vs uremia. - Cardiology recommendations: prednisone  with taper  Repeat limited echo performed on 1/28 and is significant for a significant enlargement of the effusion. Cardiology consulted with initial plan for a pericardiocentesis on 1/30, which was deferred secondary to posterior fluid location. Repeat Transthoracic Echocardiogram (2/2) is significant for persistent effusion. - Cardiology recommendations: cardiothoracic surgery consultation for consideration of pericardial window Procedure scheduled for tomorrow

## 2024-10-04 NOTE — Progress Notes (Signed)
" ° ° °  PROCEDURAL EXPEDITER PROGRESS NOTE  Patient Name: Linda [name]  DOB:02-Apr-1996 Date of Admission: 09/13/2024  Date of Assessment:10/04/24   -------------------------------------------------------------------------------------------------------------------   Brief clinical summary: pt is a 29yr old female going to surgery on 10/05/24  Orders in place:  Yes   Communication with surgical team if no orders: n/a  Labs, test, and orders reviewed: yes   Requires surgical clearance:  No  What type of clearance: n/a  Clearance received: n/a   Intervention provided by [facility] team: placed in orders for urine  pregnancy test      -------------------------------------------------------------------------------------------------------------------  [facility] Patient Care Command Expediter, Ronal DELENA Bald Please contact us  directly via secure chat (search for [facility]) or by calling us  at [phone] [facility]).  "

## 2024-10-04 NOTE — Progress Notes (Signed)
 "  10/04/24 1618  Vital Signs  Temp 97.7 F (36.5 C)  Temp Source Axillary  Pulse Rate (!) 7  Pulse Rate Source Monitor  Resp 16  BP (!) 126/93  BP Location Right Arm  BP Method Automatic  Patient Position (if appropriate) Lying  Oxygen Therapy  SpO2 98 %  O2 Device Nasal Cannula  O2 Flow Rate (L/min) 3 L/min  Patient Activity (if Appropriate) In bed  Pulse Oximetry Type Continuous  Pain Assessment  Pain Scale 0-10  Pain Score 0  Dialysis Weight  Weight 71 kg  Type of Weight Stated  Post Treatment  Dialyzer Clearance Lightly streaked  Hemodialysis Intake (mL) 0 mL  Liters Processed 72  Fluid Removed (mL) 3500 mL  Tolerated HD Treatment Yes  Post-Hemodialysis Comments Tx safely d/c  Education / Care Plan  Dialysis Education Provided Yes  Outpatient Plan of Care Reviewed and on Chart Yes   Patient Details  Name: Linda [name] MRN: [id number] Date of Birth: 1995/11/13  Bath: 2                    Duration: 3 hrs EDW: UNK BFR: 400 Heparin : no EPO: no Vitamin D: no Iron : no DFR: 300 Access: perm cath Hepatitis Status: Hep B ag -  Most Recent Labs:  Lab Results  Component Value Date/Time   HGB 9.0 (L) 10/04/2024 04:56 AM   K 5.4 (H) 10/04/2024 04:56 AM   LABPROT 15.1 04/07/2024 12:15 PM   INR 1.1 04/07/2024 12:15 PM   CALCIUM  10.5 (H) 10/04/2024 04:56 AM   PTH 408 (H) 09/03/2021 02:09 AM   ALBUMIN  3.2 (L) 10/04/2024 04:56 AM   Microbiology:  Results for orders placed or performed during the hospital encounter of 09/13/24  Culture, blood (routine x 2)     Status: None   Collection Time: 09/13/24  2:54 PM   Specimen: BLOOD RIGHT FOREARM  Result Value Ref Range Status   Specimen Description BLOOD RIGHT FOREARM  Final   Special Requests   Final    BOTTLES DRAWN AEROBIC AND ANAEROBIC Blood Culture results may not be optimal due to an inadequate volume of blood received in culture bottles   Culture   Final    NO GROWTH 5 DAYS Performed at [facility], [address], [place], [place] [address]    Report Status 09/18/2024 FINAL  Final  Surgical PCR screen     Status: Abnormal   Collection Time: 09/15/24 12:36 AM   Specimen: Nasal Mucosa; Nasal Swab  Result Value Ref Range Status   MRSA, PCR NEGATIVE NEGATIVE Final   Staphylococcus aureus POSITIVE (A) NEGATIVE Final    Comment: (NOTE) The Xpert SA Assay (FDA approved for NASAL specimens in patients 44 years of age and older), is one component of a comprehensive surveillance program. It is not intended to diagnose infection nor to guide or monitor treatment. Performed at [facility], [address], [place], [place] [address]   Aerobic/Anaerobic Culture w Gram Stain (surgical/deep wound)     Status: None   Collection Time: 09/15/24 10:30 AM   Specimen: Arm, Left; Wound  Result Value Ref Range Status   Specimen Description ARM  Final   Special Requests LEFT AV FISTULA  Final   Gram Stain   Final    RARE WBC PRESENT, PREDOMINANTLY PMN NO ORGANISMS SEEN    Culture   Final    No growth aerobically or anaerobically. Performed at [facility], [address], [place], [place] [address]    Report Status 09/20/2024 FINAL  Final  Culture, blood (Routine X 2) w Reflex to ID Panel     Status: None   Collection Time: 09/16/24 12:39 PM   Specimen: BLOOD RIGHT HAND  Result Value Ref Range Status   Specimen Description BLOOD RIGHT HAND  Final   Special Requests   Final    BOTTLES DRAWN AEROBIC AND ANAEROBIC Blood Culture results may not be optimal due to an inadequate volume of blood received in culture bottles   Culture   Final    NO GROWTH 5 DAYS Performed at [facility], [address], [place], [place] [address]    Report Status 09/21/2024 FINAL  Final  Culture, blood (Routine X 2) w Reflex to ID Panel     Status: None   Collection Time: 09/16/24  1:26 PM   Specimen: BLOOD RIGHT HAND  Result Value Ref Range Status   Specimen Description BLOOD RIGHT  HAND  Final   Special Requests   Final    BOTTLES DRAWN AEROBIC ONLY Blood Culture results may not be optimal due to an inadequate volume of blood received in culture bottles   Culture   Final    NO GROWTH 5 DAYS Performed at [facility], [address] [place], [place] [address]    Report Status 09/21/2024 FINAL  Final   Antibiotics: no Radiology: no Pt tolerated tx we, Uf 3500 Ml, VSS, alert and oriented. Reported off to [facility], RN  Nena Sayres 10/04/2024, 4:25 PM   "

## 2024-10-05 ENCOUNTER — Inpatient Hospital Stay (HOSPITAL_COMMUNITY)

## 2024-10-05 ENCOUNTER — Encounter (HOSPITAL_COMMUNITY): Admission: EM | Payer: Self-pay | Source: Home / Self Care | Attending: Family Medicine

## 2024-10-05 ENCOUNTER — Inpatient Hospital Stay (HOSPITAL_COMMUNITY): Admitting: Certified Registered Nurse Anesthetist

## 2024-10-05 ENCOUNTER — Encounter (HOSPITAL_COMMUNITY): Payer: Self-pay | Admitting: Internal Medicine

## 2024-10-05 LAB — CBC
HCT: 30.7 % — ABNORMAL LOW (ref 36.0–46.0)
Hemoglobin: 9.3 g/dL — ABNORMAL LOW (ref 12.0–15.0)
MCH: 27 pg (ref 26.0–34.0)
MCHC: 30.3 g/dL (ref 30.0–36.0)
MCV: 89 fL (ref 80.0–100.0)
Platelets: 493 10*3/uL — ABNORMAL HIGH (ref 150–400)
RBC: 3.45 MIL/uL — ABNORMAL LOW (ref 3.87–5.11)
RDW: 21.2 % — ABNORMAL HIGH (ref 11.5–15.5)
WBC: 21.4 10*3/uL — ABNORMAL HIGH (ref 4.0–10.5)
nRBC: 0 % (ref 0.0–0.2)

## 2024-10-05 LAB — BPAM RBC
Blood Product Expiration Date: 202602262359
Blood Product Expiration Date: 202602262359
Unit Type and Rh: 5100
Unit Type and Rh: 5100

## 2024-10-05 LAB — TYPE AND SCREEN
ABO/RH(D): O POS
Antibody Screen: NEGATIVE
Donor AG Type: NEGATIVE
Donor AG Type: NEGATIVE
Unit division: 0
Unit division: 0

## 2024-10-05 LAB — BASIC METABOLIC PANEL WITH GFR
Anion gap: 15 (ref 5–15)
BUN: 57 mg/dL — ABNORMAL HIGH (ref 6–20)
CO2: 26 mmol/L (ref 22–32)
Calcium: 10.1 mg/dL (ref 8.9–10.3)
Chloride: 91 mmol/L — ABNORMAL LOW (ref 98–111)
Creatinine, Ser: 5.15 mg/dL — ABNORMAL HIGH (ref 0.44–1.00)
GFR, Estimated: 11 mL/min — ABNORMAL LOW
Glucose, Bld: 112 mg/dL — ABNORMAL HIGH (ref 70–99)
Potassium: 4.9 mmol/L (ref 3.5–5.1)
Sodium: 131 mmol/L — ABNORMAL LOW (ref 135–145)

## 2024-10-05 MED ORDER — DEXAMETHASONE SOD PHOSPHATE PF 10 MG/ML IJ SOLN
INTRAMUSCULAR | Status: DC | PRN
  Administered 2024-10-05: 5 mg via INTRAVENOUS

## 2024-10-05 MED ORDER — HYDROMORPHONE HCL 1 MG/ML IJ SOLN
INTRAMUSCULAR | Status: AC
  Filled 2024-10-05: qty 1

## 2024-10-05 MED ORDER — LIDOCAINE HCL (CARDIAC) PF 100 MG/5ML IV SOSY
PREFILLED_SYRINGE | INTRAVENOUS | Status: DC | PRN
  Administered 2024-10-05: 60 mg via INTRAVENOUS

## 2024-10-05 MED ORDER — MIDAZOLAM HCL 2 MG/2ML IJ SOLN
INTRAMUSCULAR | Status: AC
  Filled 2024-10-05: qty 2

## 2024-10-05 MED ORDER — FENTANYL CITRATE (PF) 250 MCG/5ML IJ SOLN
INTRAMUSCULAR | Status: DC | PRN
  Administered 2024-10-05 (×2): 25 ug via INTRAVENOUS
  Administered 2024-10-05: 150 ug via INTRAVENOUS

## 2024-10-05 MED ORDER — IRBESARTAN 300 MG PO TABS
300.0000 mg | ORAL_TABLET | Freq: Every day | ORAL | Status: AC
  Administered 2024-10-06: 300 mg via ORAL
  Filled 2024-10-05 (×2): qty 1

## 2024-10-05 MED ORDER — SODIUM CHLORIDE 0.9 % IV SOLN: INTRAVENOUS | Status: DC | PRN

## 2024-10-05 MED ORDER — OXYCODONE HCL 5 MG/5ML PO SOLN: 5.0000 mg | Freq: Once | ORAL | Status: DC | PRN

## 2024-10-05 MED ORDER — ROCURONIUM BROMIDE 10 MG/ML (PF) SYRINGE
PREFILLED_SYRINGE | INTRAVENOUS | Status: DC | PRN
  Administered 2024-10-05 (×3): 10 mg via INTRAVENOUS
  Administered 2024-10-05: 30 mg via INTRAVENOUS

## 2024-10-05 MED ORDER — PROPOFOL 10 MG/ML IV BOLUS
INTRAVENOUS | Status: DC | PRN
  Administered 2024-10-05: 100 mg via INTRAVENOUS
  Administered 2024-10-05: 40 mg via INTRAVENOUS
  Administered 2024-10-05: 30 mg via INTRAVENOUS

## 2024-10-05 MED ORDER — PHENYLEPHRINE HCL (PRESSORS) 10 MG/ML IV SOLN
INTRAVENOUS | Status: DC | PRN
  Administered 2024-10-05 (×2): 80 ug via INTRAVENOUS

## 2024-10-05 MED ORDER — OXYCODONE HCL 5 MG PO TABS: 5.0000 mg | ORAL_TABLET | Freq: Once | ORAL | Status: DC | PRN

## 2024-10-05 MED ORDER — CARVEDILOL 25 MG PO TABS
25.0000 mg | ORAL_TABLET | Freq: Two times a day (BID) | ORAL | Status: AC
  Administered 2024-10-06: 25 mg via ORAL
  Filled 2024-10-05: qty 1

## 2024-10-05 MED ORDER — CEFAZOLIN SODIUM-DEXTROSE 2-3 GM-%(50ML) IV SOLR
INTRAVENOUS | Status: DC | PRN
  Administered 2024-10-05: 2 g via INTRAVENOUS

## 2024-10-05 MED ORDER — CHLORHEXIDINE GLUCONATE 0.12 % MT SOLN: 15.0000 mL | Freq: Once | OROMUCOSAL | Status: AC

## 2024-10-05 MED ORDER — ONDANSETRON HCL 4 MG/2ML IJ SOLN: 4.0000 mg | Freq: Four times a day (QID) | INTRAMUSCULAR | Status: AC | PRN

## 2024-10-05 MED ORDER — FENTANYL CITRATE (PF) 250 MCG/5ML IJ SOLN
INTRAMUSCULAR | Status: AC
  Filled 2024-10-05: qty 5

## 2024-10-05 MED ORDER — CLONIDINE HCL 0.1 MG PO TABS
0.1000 mg | ORAL_TABLET | Freq: Every day | ORAL | Status: AC
  Administered 2024-10-06: 0.1 mg via ORAL
  Filled 2024-10-05: qty 1

## 2024-10-05 MED ORDER — HYDROMORPHONE HCL 1 MG/ML IJ SOLN
0.5000 mg | Freq: Once | INTRAMUSCULAR | Status: AC
  Administered 2024-10-05: 0.5 mg via INTRAVENOUS

## 2024-10-05 MED ORDER — BUPIVACAINE LIPOSOME 1.3 % IJ SUSP
INTRAMUSCULAR | Status: DC | PRN
  Administered 2024-10-05: 50 mL

## 2024-10-05 MED ORDER — ONDANSETRON HCL 4 MG/2ML IJ SOLN
INTRAMUSCULAR | Status: DC | PRN
  Administered 2024-10-05: 4 mg via INTRAVENOUS

## 2024-10-05 MED ORDER — BUPIVACAINE HCL (PF) 0.5 % IJ SOLN
INTRAMUSCULAR | Status: AC
  Filled 2024-10-05: qty 30

## 2024-10-05 MED ORDER — SUCCINYLCHOLINE CHLORIDE 200 MG/10ML IV SOSY
PREFILLED_SYRINGE | INTRAVENOUS | Status: DC | PRN
  Administered 2024-10-05: 120 mg via INTRAVENOUS

## 2024-10-05 MED ORDER — PROPOFOL 10 MG/ML IV BOLUS
INTRAVENOUS | Status: AC
  Filled 2024-10-05: qty 20

## 2024-10-05 MED ORDER — LACTATED RINGERS IV SOLN: INTRAVENOUS | Status: DC

## 2024-10-05 MED ORDER — HYDROMORPHONE HCL 1 MG/ML IJ SOLN: 0.5000 mg | Freq: Once | INTRAMUSCULAR | Status: AC

## 2024-10-05 MED ORDER — BISACODYL 5 MG PO TBEC
10.0000 mg | DELAYED_RELEASE_TABLET | Freq: Every day | ORAL | Status: AC
  Administered 2024-10-05 – 2024-10-06 (×2): 10 mg via ORAL
  Filled 2024-10-05: qty 2

## 2024-10-05 MED ORDER — SUGAMMADEX SODIUM 200 MG/2ML IV SOLN
INTRAVENOUS | Status: DC | PRN
  Administered 2024-10-05: 200 mg via INTRAVENOUS

## 2024-10-05 MED ORDER — HYDROMORPHONE HCL 1 MG/ML IJ SOLN: 0.2500 mg | INTRAMUSCULAR | Status: DC | PRN

## 2024-10-05 MED ORDER — HYDROMORPHONE HCL 1 MG/ML IJ SOLN
0.2500 mg | INTRAMUSCULAR | Status: DC | PRN
  Administered 2024-10-05 (×3): 0.5 mg via INTRAVENOUS

## 2024-10-05 MED ORDER — 0.9 % SODIUM CHLORIDE (POUR BTL) OPTIME
TOPICAL | Status: DC | PRN
  Administered 2024-10-05: 2000 mL

## 2024-10-05 MED ORDER — MIDAZOLAM HCL (PF) 2 MG/2ML IJ SOLN
INTRAMUSCULAR | Status: DC | PRN
  Administered 2024-10-05: 2 mg via INTRAVENOUS

## 2024-10-05 MED ORDER — CEFAZOLIN SODIUM-DEXTROSE 2-4 GM/100ML-% IV SOLN: 2.0000 g | Freq: Three times a day (TID) | INTRAVENOUS | Status: DC

## 2024-10-05 MED ORDER — CHLORHEXIDINE GLUCONATE 0.12 % MT SOLN
OROMUCOSAL | Status: AC
  Administered 2024-10-05: 15 mL via OROMUCOSAL
  Filled 2024-10-05: qty 15

## 2024-10-05 MED ORDER — SENNOSIDES-DOCUSATE SODIUM 8.6-50 MG PO TABS
1.0000 | ORAL_TABLET | Freq: Every day | ORAL | Status: AC
  Administered 2024-10-05 – 2024-10-06 (×2): 1 via ORAL
  Filled 2024-10-05: qty 1

## 2024-10-05 MED ORDER — ALBUMIN HUMAN 5 % IV SOLN: INTRAVENOUS | Status: DC | PRN

## 2024-10-05 MED ORDER — HYDROMORPHONE HCL 1 MG/ML IJ SOLN
INTRAMUSCULAR | Status: AC
  Administered 2024-10-05: 0.5 mg via INTRAVENOUS
  Filled 2024-10-05: qty 1

## 2024-10-05 MED ORDER — AMLODIPINE BESYLATE 10 MG PO TABS
10.0000 mg | ORAL_TABLET | Freq: Every day | ORAL | Status: AC
  Administered 2024-10-06: 10 mg via ORAL
  Filled 2024-10-05: qty 1

## 2024-10-05 MED ORDER — BUPIVACAINE LIPOSOME 1.3 % IJ SUSP
INTRAMUSCULAR | Status: AC
  Filled 2024-10-05: qty 20

## 2024-10-05 MED ORDER — ORAL CARE MOUTH RINSE: 15.0000 mL | Freq: Once | OROMUCOSAL | Status: AC

## 2024-10-05 NOTE — Brief Op Note (Signed)
 OR Date: 09/13/2024 - 10/05/2024 OR Patient Start Time:  1:19 PM  4:52 PM  PATIENT:  Simuel Johnnie Childs  29 y.o. female  PRE-OPERATIVE DIAGNOSIS:  Pericardial effusion  POST-OPERATIVE DIAGNOSIS:  Pericardial effusion  PROCEDURE:  Xi ASSISTED RIGHT THORACOSCOPY for PERICARDIAL WINDOW   SURGEON:  Surgeons and Role:    Shyrl Linnie KIDD, MD - Primary  PHYSICIAN ASSISTANT: Kyla Donald PA-C  ANESTHESIA:   general  EBL: < 100 cc  BLOOD ADMINISTERED:none  DRAINS: Chest tube place in the right pleural space   LOCAL MEDICATIONS USED:  OTHER Exparel   SPECIMEN:  Source of Specimen:  Pericardial biopsy  DISPOSITION OF SPECIMEN:  PATHOLOGY  COUNTS CORRECT:  YES  DICTATION: .Dragon Dictation  PLAN OF CARE: Admit to inpatient   PATIENT DISPOSITION:  PACU - hemodynamically stable.   Delay start of Pharmacological VTE agent (>24hrs) due to surgical blood loss or risk of bleeding: yes

## 2024-10-05 NOTE — Op Note (Signed)
 "     [address] [place] [address]             [phone]    10/05/2024  Patient:  Linda [name] Pre-Op  Dx: pericardial effusion   Post-op Dx:  same Procedure: - Robotic assisted right video thoracoscopy - Pericardial window - Intercostal nerve block  Surgeon and Role:      * Anesha Hackert, Linnie KIDD, MD - Primary     Assistant: CHARM Donald, PA-C  Anesthesia  general EBL:  minimal Blood Administration: none Specimen:  pericardium  Drains: 19 F chest tube in right chest Counts: correct   Indications: Linda [name] is a 29 yo AA female with known history of Lupus Nephritis, G6PD Deficiency, ESRD on dialysis, HTN, GERD, and HFrEF.  She has been hospitalized since 09/08/2024.  At which time she presented to the Emergency Department via EMS with complaints of abdominal pain,  nausea, vomiting, and bloating.  She did admit she had missed her previous 3 dialysis sessions.  She also complained of significant pain in her left arm.  She felt she had a fever at home prior to presentation, however she did not record it.  ED workup showed a mild leukocytosis, Hgb level was stable, CXR showed mild interstitial edema, UE DVT did not show any abnormalities.  She was treated with Lokelma  for high potassium.  She underwent urgent dialysis.  She ultimately required admission to the hospital for further care.  She continued to complain of left arm pain/swelling.  Unfortunately she developed mild dehiscence of the incision and she did not feel she could tolerate the pain.  Vascular surgery has been involved in her care since admission and she ultimately chose to have her fistula ligated.  She developed chest pain and nausea with mildly elevated troponin level.  Cardiology consult was obtained and did not feel patient suffered an MI.  She underwent ligation of her Fistula in her left arm on 1/16.  She required multiple returns to OR for wound care and vac changes.  Her chest  pain persisted and Echocardiogram was obtained and showed a moderate posterior pericardial effusion. Cardiology ultimately signed off with plan for repeat outpatient Echocardiogram in a few weeks.  She developed worsening chest pain and was started on prednisone  which did not relieve her symptoms.  She underwent repeat Echocardiogram on 09/27/2024 which showed stable LV Dysfunction but there was an increase in his pericardial effusion without evidence of tamponade.  Cardiology was re-consulted.  They felt patient should undergo Pericardiocentesis.  This was attempted on 1/30 by Dr. Wendel however she had been dialyzed that day and there was only a small amount of fluid located anteriorly and they felt it safest to defer procedure.  Now cardiothoracic surgery consultation has been requested for surgical creation of pericardial window. The patient is doing okay.  She continues to have chest pain which remains positional.  She states its mostly when she tries to lay down at night, so she has been having to sleep in the recliner.  Her aunt was on the telephone and I addressed/answered questions/concerns.   Findings: Thin pleural adhesions to the pericardium.  Dense loculated pericardial adhesions with multiple pockets.  Serosanguinous pericardial fluid.  Operative Technique: After the risks, benefits and alternatives were thoroughly discussed, the patient was brought to the operative theatre.  Anesthesia was induced, and the patient was then placed in a lazy lateral decubitus position and was prepped and draped  in normal sterile fashion.  An appropriate surgical pause was performed, and pre-operative antibiotics were dosed accordingly.  We began by placing our 3 robotic ports in the the intercostal spaces targeting the pericardium.  The robot was then docked and all instruments were passed under direct visualization.    The pericardium was visualized, and a 4 X 3cm centimeters pericardial window was created  using Bovie cautery.  The pericardium was thickened.  Immediate release of serosanguinous fliud pericardial fluid was released.  The fluid and pericardium were sent for specimen.  A 19 French Blake drain was passed through right inferior robotic port into the pericardium.  An intercostal nerve block was performed under direct visualization.  The skin and soft tissue were closed with absorbable suture    The patient tolerated the procedure without any immediate complications, and was transferred to the PACU in stable condition.  Linda [name]  "

## 2024-10-05 NOTE — Anesthesia Preprocedure Evaluation (Signed)
 "                                  Anesthesia Evaluation  Patient identified by MRN, date of birth, ID band  Reviewed: Allergy & Precautions, H&P , NPO status , Patient's Chart, lab work & pertinent test results, Unable to perform ROS - Chart review only  History of Anesthesia Complications Negative for: history of anesthetic complications  Airway Mallampati: III  TM Distance: >3 FB Neck ROM: Full    Dental  (+) Dental Advisory Given, Teeth Intact,    Pulmonary neg sleep apnea, neg recent URI, Current Smoker and Patient abstained from smoking.   breath sounds clear to auscultation       Cardiovascular hypertension, Pt. on medications + angina  +CHF   Rhythm:Regular  TTE 02/2024: 1. Left ventricular ejection fraction, by estimation, is 30 to 35%. The  left ventricle has moderately decreased function. The left ventricle  demonstrates global hypokinesis. The left ventricular internal cavity size  was moderately dilated. There is mild   left ventricular hypertrophy. Left ventricular diastolic parameters are  indeterminate.   2. Right ventricular systolic function is normal. The right ventricular  size is normal.   3. Left atrial size was moderately dilated.   4. Right atrial size was moderately dilated.   5. The mitral valve is abnormal. Mild mitral valve regurgitation. No  evidence of mitral stenosis.   6. Tricuspid valve regurgitation is moderate.   7. The aortic valve is normal in structure. Aortic valve regurgitation is  not visualized. No aortic stenosis is present.   8. The inferior vena cava is normal in size with greater than 50%  respiratory variability, suggesting right atrial pressure of 3 mmHg.     Neuro/Psych  Headaches, neg Seizures PSYCHIATRIC DISORDERS Anxiety        GI/Hepatic Neg liver ROS,GERD  Medicated,,  Endo/Other  negative endocrine ROSType 2    Renal/GU ESRF and DialysisRenal diseaseLab Results      Component                Value                Date                      NA                       131 (L)             10/05/2024                K                        4.9                 10/05/2024                CO2                      26                  10/05/2024                GLUCOSE                  112 (H)  10/05/2024                BUN                      57 (H)              10/05/2024                CREATININE               5.15 (H)            10/05/2024                CALCIUM                   10.1                10/05/2024                GFRNONAA                 11 (L)              10/05/2024                  Last HD 2/4     Musculoskeletal negative musculoskeletal ROS (+)    Abdominal   Peds  Hematology  (+) Blood dyscrasia, anemia Lab Results      Component                Value               Date                      WBC                      21.4 (H)            10/05/2024                HGB                      9.3 (L)             10/05/2024                HCT                      30.7 (L)            10/05/2024                MCV                      89.0                10/05/2024                PLT                      493 (H)             10/05/2024              Anesthesia Other Findings SLE, Complex Pain Syndrome   Pericardial effusion  Reproductive/Obstetrics                              Anesthesia  Physical Anesthesia Plan  ASA: 4  Anesthesia Plan: General   Post-op Pain Management:    Induction: Intravenous  PONV Risk Score and Plan: 2 and Ondansetron  and Treatment may vary due to age or medical condition  Airway Management Planned: Double Lumen EBT  Additional Equipment: Arterial line  Intra-op Plan:   Post-operative Plan: Extubation in OR  Informed Consent: I have reviewed the patients History and Physical, chart, labs and discussed the procedure including the risks, benefits and alternatives for the proposed anesthesia with the patient or  authorized representative who has indicated his/her understanding and acceptance.     Dental advisory given  Plan Discussed with: CRNA  Anesthesia Plan Comments:          Anesthesia Quick Evaluation  "

## 2024-10-05 NOTE — Transfer of Care (Signed)
 Immediate Anesthesia Transfer of Care Note  Patient: Linda [name]  Procedure(s) Performed: CREATION, PERICARDIAL WINDOW, ROBOT-ASSISTED (Right: Chest)  Patient Location: PACU  Anesthesia Type:General  Level of Consciousness: awake, drowsy, patient cooperative, and responds to stimulation  Airway & Oxygen Therapy: Patient Spontanous Breathing and Patient connected to nasal cannula oxygen  Post-op Assessment: Report given to RN and Post -op Vital signs reviewed and stable  Post vital signs: Reviewed and stable  Last Vitals:  Vitals Value Taken Time  BP 111/79 10/05/24 17:18  Temp    Pulse 58 10/05/24 17:26  Resp 22 10/05/24 17:28  SpO2 96 % 10/05/24 17:26  Vitals shown include unfiled device data.  Last Pain:  Vitals:   10/05/24 1726  TempSrc:   PainSc: 8       Patients Stated Pain Goal: 0 (10/01/24 1229)  Complications: No notable events documented.

## 2024-10-05 NOTE — Anesthesia Procedure Notes (Signed)
 Procedure Name: Intubation Date/Time: 10/05/2024 3:50 PM  Performed by: Cindie Donald CROME, CRNAPre-anesthesia Checklist: Patient identified, Emergency Drugs available, Suction available and Patient being monitored Patient Re-evaluated:Patient Re-evaluated prior to induction Oxygen Delivery Method: Circle System Utilized Preoxygenation: Pre-oxygenation with 100% oxygen Induction Type: IV induction Ventilation: Mask ventilation without difficulty Laryngoscope Size: Miller and 2 Grade View: Grade I Tube type: Oral Endobronchial tube: Left, Double lumen EBT, EBT position confirmed by auscultation and Bronchial Blocker placed under direct vision and 37 Fr Number of attempts: 1 Airway Equipment and Method: Stylet Placement Confirmation: ETT inserted through vocal cords under direct vision, positive ETCO2 and breath sounds checked- equal and bilateral Secured at: 26 cm Tube secured with: Tape Dental Injury: Teeth and Oropharynx as per pre-operative assessment

## 2024-10-05 NOTE — Anesthesia Procedure Notes (Signed)
 Arterial Line Insertion Start/End2/12/2024 1:00 PM, 10/05/2024 1:20 PM Performed by: Leopoldo Bruckner, MD, Cindie Donald CROME, CRNA, CRNA  Patient location: Pre-op . Preanesthetic checklist: patient identified, IV checked, site marked, risks and benefits discussed, surgical consent, monitors and equipment checked, pre-op  evaluation, timeout performed and anesthesia consent Lidocaine  1% used for infiltration Right, radial was placed Catheter size: 20 G Hand hygiene performed  and maximum sterile barriers used   Attempts: 2 Following insertion, dressing applied and Biopatch. Post procedure assessment: normal and unchanged  Patient tolerated the procedure well with no immediate complications.

## 2024-10-05 NOTE — Progress Notes (Signed)
" ° °  Brief Progress Note   _____________________________________________________________________________________________________________  Patient Name: Linda [name] Patient DOB: 08-25-1996 Date: 10-05-24     Action: Reached out to bedside nurse to help get patient ready for surgery   _____________________________________________________________________________________________________________  [facility] Patient Care Command RN Expeditor Rexene LITTIE Kirks Please contact us  directly via secure chat (search for [facility]) or by calling us  at [phone] [facility]).  "

## 2024-10-05 NOTE — Progress Notes (Signed)
" °   °  [address] [place] [address]             [phone]       No events Vitals:   10/05/24 1131 10/05/24 1137  BP: (!) 129/97   Pulse: 61   Resp: (!) 21   Temp: 98.5 F (36.9 C)   SpO2: 90% 94%   Alert NAD Sinus Increased WOB  OR for R RATS, pericardial window  "

## 2024-10-05 NOTE — Progress Notes (Signed)
 " [facility] Progress Note   Subjective:    Patient remains off the floor for pericardial window procedure. Noted 3.5L was removed with HD yesterday. Plan for HD tomorrow.  Objective Vitals:   10/05/24 0549 10/05/24 0913 10/05/24 1131 10/05/24 1137  BP: (!) 132/98 (!) 140/99 (!) 129/97   Pulse: 70 64 61   Resp: 18  (!) 21   Temp: 98.3 F (36.8 C) 98.2 F (36.8 C) 98.5 F (36.9 C)   TempSrc: Oral Oral Oral   SpO2: 96% 96% 90% 94%  Weight:      Height:       Physical Exam *Unable to assess due to patient being off the floor for procedure*  Filed Weights   10/02/24 1213 10/04/24 1148 10/04/24 1618  Weight: 68.5 kg 71 kg 71 kg    Intake/Output Summary (Last 24 hours) at 10/05/2024 1637 Last data filed at 10/05/2024 1610 Gross per 24 hour  Intake 365 ml  Output 0 ml  Net 365 ml    Additional Objective Labs: Basic Metabolic Panel: Recent Labs  Lab 09/29/24 0508 09/30/24 0508 10/03/24 0351 10/04/24 0456 10/05/24 0324  NA 132*   < > 133* 133* 131*  K 4.5   < > 4.8 5.4* 4.9  CL 89*   < > 93* 92* 91*  CO2 25   < > 27 26 26   GLUCOSE 110*   < > 111* 100* 112*  BUN 48*   < > 47* 73* 57*  CREATININE 5.98*   < > 4.42* 5.91* 5.15*  CALCIUM  10.5*   < > 10.4* 10.5* 10.1  PHOS 5.7*  --   --  3.9  --    < > = values in this interval not displayed.   Liver Function Tests: Recent Labs  Lab 09/29/24 0508 10/04/24 0456  ALBUMIN  3.2* 3.2*   No results for input(s): LIPASE, AMYLASE in the last 168 hours. CBC: Recent Labs  Lab 09/30/24 0508 10/01/24 0458 10/02/24 0305 10/03/24 0351 10/04/24 0456 10/05/24 0324  WBC 19.5* 26.4* 27.8* 22.4* 25.3* 21.4*  NEUTROABS 17.0*  --   --   --   --   --   HGB 8.7* 9.2* 9.8* 9.3* 9.0* 9.3*  HCT 28.2* 30.7* 31.8* 31.0* 30.4* 30.7*  MCV 88.4 89.8 89.1 89.1 89.9 89.0  PLT 383 415* 475* 480* 477* 493*   Blood Culture    Component Value Date/Time   SDES BLOOD RIGHT HAND 09/16/2024 1326   SPECREQUEST  09/16/2024  1326    BOTTLES DRAWN AEROBIC ONLY Blood Culture results may not be optimal due to an inadequate volume of blood received in culture bottles   CULT  09/16/2024 1326    NO GROWTH 5 DAYS Performed at [facility], [address], [place], [place] [address]    REPTSTATUS 09/21/2024 FINAL 09/16/2024 1326    Cardiac Enzymes: No results for input(s): CKTOTAL, CKMB, CKMBINDEX, TROPONINI in the last 168 hours. CBG: No results for input(s): GLUCAP in the last 168 hours. Iron  Studies: No results for input(s): IRON , TIBC, TRANSFERRIN, FERRITIN in the last 72 hours. Lab Results  Component Value Date   INR 1.1 04/07/2024   INR 1.2 10/10/2023   INR 1.0 03/27/2019   Studies/Results: No results found.  Medications:  [MAR Hold] anticoagulant sodium citrate      lactated ringers       [MAR Hold] amLODipine   10 mg Oral QHS   [MAR Hold] carvedilol   25 mg Oral BID WC   [  MAR Hold] Chlorhexidine  Gluconate Cloth  6 each Topical Q0600   [MAR Hold] cloNIDine   0.1 mg Oral Daily   [MAR Hold] darbepoetin (ARANESP ) injection - DIALYSIS  200 mcg Subcutaneous Q Sun-1800   [MAR Hold] diclofenac  Sodium  2 g Topical QID   [MAR Hold] furosemide   80 mg Oral Daily   [MAR Hold] irbesartan   300 mg Oral Daily   [MAR Hold] pantoprazole   40 mg Oral Daily   [MAR Hold] predniSONE   40 mg Oral Q breakfast   [MAR Hold] sevelamer  carbonate  2.4 g Oral TID WC    Dialysis Orders: MWF - [facility] 3.5hrs, BFR 400, DFR 500,  EDW 68.4kg, 2K/ 2Ca, Heparin  3000 units bolus - Mircera 225 mcg q2wks - last 09/11/24 - Hectorol  5mcg IV qHD - last 09/13/24 - Sensipar  90mg  with HD - last 09/13/24  Assessment/Plan: Pericardial effusion - enlarging effusion.  No tamponade on Echo. Prednisone  started. Cardiology following -pericardiocentesis deferred 1/30 - felt to be improving but patient remains symptomatic. Repeat Echo 2/2 - large pericardial effusion. CTS consulted - planning for pericardial window  Thurs.  AVF malfunction - VVS following. Hx multiple revisions in the past, last 1/7. S/p ligation 1/16. S/p I&D with wound vac placement 1/20.  ESRD -  on HD MWF. Next HD 2/6. Consider increase Rx time for pericarditis. Unfortunately very high inpatient dialysis census currently.  Hypertension/volume  - BP meds resumed. UF as tolerated with HD as above HFrEF - max volume removal with HD as tolerated  Anemia of CKD - Hgb 9.3 on high dose ESA Secondary Hyperparathyroidism -  Ca elevated Phos better - restarted binders (sevelamer ). Stopped hectorol .  Nutrition - Renal diet with fluid restriction GERD - Had been getting sucralfate , stopped. Continue PPI  Charmaine Piety, NP [facility] 10/05/2024,4:37 PM  LOS: 21 days    "

## 2024-10-05 NOTE — Progress Notes (Signed)
Pts meal tray delivered.  

## 2024-10-05 NOTE — Progress Notes (Signed)
 Pericardial window today  Prednisone  taper  Oneil Parchment, MD

## 2024-10-05 NOTE — Progress Notes (Signed)
 Patient underwent pericardial window today, will follow when back in the medical ward

## 2024-10-06 ENCOUNTER — Other Ambulatory Visit: Payer: Self-pay | Admitting: Vascular Surgery

## 2024-10-06 ENCOUNTER — Inpatient Hospital Stay (HOSPITAL_COMMUNITY)

## 2024-10-06 ENCOUNTER — Encounter (HOSPITAL_COMMUNITY): Payer: Self-pay | Admitting: Thoracic Surgery (Cardiothoracic Vascular Surgery)

## 2024-10-06 DIAGNOSIS — N186 End stage renal disease: Secondary | ICD-10-CM

## 2024-10-06 LAB — BASIC METABOLIC PANEL WITH GFR
Anion gap: 18 — ABNORMAL HIGH (ref 5–15)
BUN: 84 mg/dL — ABNORMAL HIGH (ref 6–20)
CO2: 21 mmol/L — ABNORMAL LOW (ref 22–32)
Calcium: 9.5 mg/dL (ref 8.9–10.3)
Chloride: 90 mmol/L — ABNORMAL LOW (ref 98–111)
Creatinine, Ser: 6.82 mg/dL — ABNORMAL HIGH (ref 0.44–1.00)
GFR, Estimated: 8 mL/min — ABNORMAL LOW
Glucose, Bld: 119 mg/dL — ABNORMAL HIGH (ref 70–99)
Potassium: 6 mmol/L — ABNORMAL HIGH (ref 3.5–5.1)
Sodium: 129 mmol/L — ABNORMAL LOW (ref 135–145)

## 2024-10-06 LAB — CBC
HCT: 31.9 % — ABNORMAL LOW (ref 36.0–46.0)
Hemoglobin: 9.6 g/dL — ABNORMAL LOW (ref 12.0–15.0)
MCH: 27 pg (ref 26.0–34.0)
MCHC: 30.1 g/dL (ref 30.0–36.0)
MCV: 89.6 fL (ref 80.0–100.0)
Platelets: 481 10*3/uL — ABNORMAL HIGH (ref 150–400)
RBC: 3.56 MIL/uL — ABNORMAL LOW (ref 3.87–5.11)
RDW: 21.2 % — ABNORMAL HIGH (ref 11.5–15.5)
WBC: 26 10*3/uL — ABNORMAL HIGH (ref 4.0–10.5)
nRBC: 0 % (ref 0.0–0.2)

## 2024-10-06 MED ORDER — HYDROMORPHONE HCL 1 MG/ML IJ SOLN
INTRAMUSCULAR | Status: AC
  Filled 2024-10-06: qty 0.5

## 2024-10-06 MED ORDER — SODIUM ZIRCONIUM CYCLOSILICATE 10 G PO PACK
10.0000 g | PACK | Freq: Once | ORAL | Status: AC
  Filled 2024-10-06: qty 1

## 2024-10-06 NOTE — Consult Note (Addendum)
"  ° °  ° °  °  CLINICAL SUPPORT TEAM - WOUND OSTOMY AND CONTINENCE TEAM   CONSULTATION SERVICES     WOC Nurse-Inpatient Note    WOC Nurse wound follow up Pt was medicated for pain prior to the procedure and tolerated with minimal amt discomfort. Pt slowly removed previous Vac dressing, using adhesive remover and saline, then I applied one piece small peel and place Vac dressing.  Full thickness post-op wound to left arm is 3X3.5X.3cm, red-yellow, sutures visible to inner wound, small amt pink drainage in the cannister.       WOC team will plan for dressing change again next Fri if patient is still in the hospital at that time.    Thank-you,  Stephane Fought MSN, RN, CWOCN, CWCN-AP, CNS Contact Mon-Fri 0700-1500: [phone]      Revision History "

## 2024-10-06 NOTE — Plan of Care (Signed)
  Problem: Clinical Measurements: Goal: Will remain free from infection Outcome: Progressing Goal: Respiratory complications will improve Outcome: Progressing Goal: Cardiovascular complication will be avoided Outcome: Progressing   

## 2024-10-06 NOTE — Progress Notes (Signed)
 " Linda [name] Progress Note   Subjective:    Seen and examined patient on the HD unit. She is about to start treatment. She reports breathing is about the same. Appears mildly labored but not in acute distress. S/p pericardial window yesterday. UFG set 2.5L today.   Objective Vitals:   10/06/24 0800 10/06/24 0819 10/06/24 0930 10/06/24 1000  BP: 123/87 117/86 128/89 127/86  Pulse:      Resp: (!) 28     Temp:      TempSrc:      SpO2: 100% 100% 100% 99%  Weight:      Height:       Physical Exam General: Awake, alert, NAD Lungs: Clear anteriorly CV: RRR, no rub auscultated  Abdomen: non-distended Extremities: no LE edema, L arm edema improved Dialysis Access: [facility]   Filed Weights   10/04/24 1148 10/04/24 1618 10/06/24 0500  Weight: 71 kg 71 kg 71.2 kg    Intake/Output Summary (Last 24 hours) at 10/06/2024 1028 Last data filed at 10/06/2024 0600 Gross per 24 hour  Intake 1195 ml  Output 175 ml  Net 1020 ml    Additional Objective Labs: Basic Metabolic Panel: Recent Labs  Lab 10/04/24 0456 10/05/24 0324 10/06/24 0401  NA 133* 131* 129*  K 5.4* 4.9 6.0*  CL 92* 91* 90*  CO2 26 26 21*  GLUCOSE 100* 112* 119*  BUN 73* 57* 84*  CREATININE 5.91* 5.15* 6.82*  CALCIUM  10.5* 10.1 9.5  PHOS 3.9  --   --    Liver Function Tests: Recent Labs  Lab 10/04/24 0456  ALBUMIN  3.2*   No results for input(s): LIPASE, AMYLASE in the last 168 hours. CBC: Recent Labs  Lab 09/30/24 0508 10/01/24 0458 10/02/24 0305 10/03/24 0351 10/04/24 0456 10/05/24 0324 10/06/24 0401  WBC 19.5*   < > 27.8* 22.4* 25.3* 21.4* 26.0*  NEUTROABS 17.0*  --   --   --   --   --   --   HGB 8.7*   < > 9.8* 9.3* 9.0* 9.3* 9.6*  HCT 28.2*   < > 31.8* 31.0* 30.4* 30.7* 31.9*  MCV 88.4   < > 89.1 89.1 89.9 89.0 89.6  PLT 383   < > 475* 480* 477* 493* 481*   < > = values in this interval not displayed.   Blood Culture    Component Value Date/Time   SDES BLOOD RIGHT HAND  09/16/2024 1326   SPECREQUEST  09/16/2024 1326    BOTTLES DRAWN AEROBIC ONLY Blood Culture results may not be optimal due to an inadequate volume of blood received in culture bottles   CULT  09/16/2024 1326    NO GROWTH 5 DAYS Performed at [facility], [address] [place], [place] [address]    REPTSTATUS 09/21/2024 FINAL 09/16/2024 1326    Cardiac Enzymes: No results for input(s): CKTOTAL, CKMB, CKMBINDEX, TROPONINI in the last 168 hours. CBG: No results for input(s): GLUCAP in the last 168 hours. Iron  Studies: No results for input(s): IRON , TIBC, TRANSFERRIN, FERRITIN in the last 72 hours. Lab Results  Component Value Date   INR 1.1 04/07/2024   INR 1.2 10/10/2023   INR 1.0 03/27/2019   Studies/Results: DG Chest Port 1 View Result Date: 10/05/2024 CLINICAL DATA:  Pneumothorax. EXAM: PORTABLE CHEST 1 VIEW COMPARISON:  09/28/2024 FINDINGS: Right-sided chest tube in place, the tube courses to the apex with tip directed inferiorly. No convincing pneumothorax. Right-sided dialysis catheter in place. Slightly diminished size  of cardiomediastinal silhouette, cardiomegaly persists. Suspect small pleural effusions. No pulmonary edema. IMPRESSION: 1. Right-sided chest tube in place. No convincing pneumothorax. 2. Suspect small pleural effusions. 3. Diminished size of the cardiomediastinal silhouette, cardiomegaly persists. Electronically Signed   By: Andrea Gasman M.D.   On: 10/05/2024 19:04    Medications:  anticoagulant sodium citrate       amLODipine   10 mg Oral QHS   bisacodyl   10 mg Oral Daily   carvedilol   25 mg Oral BID WC   Chlorhexidine  Gluconate Cloth  6 each Topical Q0600   cloNIDine   0.1 mg Oral Daily   darbepoetin (ARANESP ) injection - DIALYSIS  200 mcg Subcutaneous Q Sun-1800   diclofenac  Sodium  2 g Topical QID   furosemide   80 mg Oral Daily   irbesartan   300 mg Oral Daily   pantoprazole   40 mg Oral Daily   predniSONE   40 mg Oral Q breakfast    senna-docusate  1 tablet Oral QHS   sevelamer  carbonate  2.4 g Oral TID WC   sodium zirconium cyclosilicate   10 g Oral Once    Dialysis Orders: MWF - [facility] 3.5hrs, BFR 400, DFR 500,  EDW 68.4kg, 2K/ 2Ca, Heparin  3000 units bolus - Mircera 225 mcg q2wks - last 09/11/24 - Hectorol  5mcg IV qHD - last 09/13/24 - Sensipar  90mg  with HD - last 09/13/24  Assessment/Plan: Pericardial effusion - enlarging effusion.  No tamponade on Echo. Prednisone  started. Cardiology following -pericardiocentesis deferred 1/30 - felt to be improving but patient remains symptomatic. Repeat Echo 2/2 - large pericardial effusion. CTS following - s/p pericardial window 2/5.  AVF malfunction - VVS following. Hx multiple revisions in the past, last 1/7. S/p ligation 1/16. S/p I&D with wound vac placement 1/20.  ESRD -  on HD MWF. On HD. Consider increase Rx time for pericarditis. Unfortunately very high inpatient dialysis census currently.  Hypertension/volume  - BP meds resumed. UF as tolerated with HD as above HFrEF - max volume removal with HD as tolerated, please note she has difficulty with high UFGs Anemia of CKD - Hgb 9.6 on high dose ESA Secondary Hyperparathyroidism -  Binders (sevelamer ) restarted. Stopped hectorol . Ca better. Nutrition - Renal diet with fluid restriction GERD - Had been getting sucralfate , stopped. Continue PPI  Charmaine Piety, NP [name] 10/06/2024,10:28 AM  LOS: 22 days    "

## 2024-10-06 NOTE — Progress Notes (Signed)
" °   10/06/24 1326  Vitals  Temp 98 F (36.7 C)  Temp Source Oral  BP 125/83  BP Location Right Arm  BP Method Automatic  Pulse Rate 77  ECG Heart Rate 77  Resp (!) 24  Weight 69.4 kg  Type of Weight Post-Dialysis  Oxygen Therapy  SpO2 97 %  O2 Device Nasal Cannula  Pulse Oximetry Type Continuous  Post Treatment  Dialyzer Clearance Lightly streaked  Liters Processed 83.9  Fluid Removed (mL) 2500 mL  Tolerated HD Treatment Yes  Hemodialysis Catheter Right Internal jugular Double lumen Temporary (Non-Tunneled)  No placement date or time found.   Orientation: Right  Access Location: Internal jugular  Hemodialysis Catheter Type: Double lumen Temporary (Non-Tunneled)  Site Condition No complications  Blue Lumen Status Heparin  locked;Antimicrobial dead end cap  Red Lumen Status Heparin  locked;Antimicrobial dead end cap  Catheter fill solution Heparin  1000 units/ml  Catheter fill volume (Arterial) 1.9 cc  Catheter fill volume (Venous) 1.9  Dressing Type Transparent  Dressing Status Antimicrobial disc/dressing in place;Clean, Dry, Intact  Drainage Description None  Dressing Change Due 10/12/24  Post treatment catheter status Capped and Clamped   Patient ran over her originally prescribed time provider aware "

## 2024-10-06 NOTE — Progress Notes (Signed)
" ° °   °  [address] Zone [place] [address]             [phone]      1 Day Post-Op Procedures (LRB): CREATION, PERICARDIAL WINDOW, ROBOT-ASSISTED (Right) Subjective: Patient is in dialysis reports she feels about the same. She still has shortness of breath but reports it might be a little better this AM.   Objective: Vital signs in last 24 hours: Temp:  [97.8 F (36.6 C)-99.2 F (37.3 C)] 99.2 F (37.3 C) (02/06 0700) Pulse Rate:  [55-84] 70 (02/06 0700) Cardiac Rhythm: Sinus bradycardia (02/05 1956) Resp:  [15-28] 28 (02/06 0800) BP: (111-140)/(74-100) 117/86 (02/06 0819) SpO2:  [87 %-100 %] 100 % (02/06 0819) Arterial Line BP: (156-185)/(83-99) 166/93 (02/05 1815) Weight:  [71.2 kg] 71.2 kg (02/06 0500)  Hemodynamic parameters for last 24 hours:    Intake/Output from previous day: 02/05 0701 - 02/06 0700 In: 1195 [P.O.:600; I.V.:450; IV Piggyback:125] Out: 175 [Blood:75; Chest Tube:100] Intake/Output this shift: No intake/output data recorded.  General appearance: alert, cooperative, and no distress Neurologic: intact Heart: regular rate and rhythm, S1, S2 normal, no murmur, click, rub or gallop Lungs: diminished bibasilar breath sounds Wound: Drain in place with clean and dry dressing, serosanguinous drainage  Lab Results: Recent Labs    10/05/24 0324 10/06/24 0401  WBC 21.4* 26.0*  HGB 9.3* 9.6*  HCT 30.7* 31.9*  PLT 493* 481*   BMET:  Recent Labs    10/05/24 0324 10/06/24 0401  NA 131* 129*  K 4.9 6.0*  CL 91* 90*  CO2 26 21*  GLUCOSE 112* 119*  BUN 57* 84*  CREATININE 5.15* 6.82*  CALCIUM  10.1 9.5    PT/INR: No results for input(s): LABPROT, INR in the last 72 hours. ABG    Component Value Date/Time   PHART 7.275 (L) 03/06/2024 1101   HCO3 26.3 03/06/2024 1101   TCO2 27 09/15/2024 0850   ACIDBASEDEF 1.0 03/06/2024 1101   O2SAT 97 03/06/2024 1101   CBG (last 3)  No results for input(s): GLUCAP in the last 72  hours.  Assessment/Plan: S/P Procedures (LRB): CREATION, PERICARDIAL WINDOW, ROBOT-ASSISTED (Right)  CV: Stable vital signs. NSR, HR 70s. Pericardial drain with 100cc/24hrs of serosanguinous output. Will leave in place today. If output is less than 150cc/24hrs tomorrow drain may be d/c'd.   Pulm: Saturating well on 2L Ward. Encourage IS and ambulation.   Dispo: Leave pericardial drain in place today and ambulate. Care per primary team.    LOS: 22 days    Con GORMAN Bend, pA-C 10/06/2024   "

## 2024-10-06 NOTE — Progress Notes (Signed)
 " PROGRESS NOTE    Linda [name]  FMW:[id number] DOB: 26-Nov-1995 DOA: 09/13/2024 PCP: [name], NP    Chief Complaint  Patient presents with   Post-op Problem   Arm Pain   Emesis   Nausea   knots in my thighs    Brief Narrative:  Linda [name] is a 29 y.o. female with a history of hypertension, G6PD deficiency, CHF, lupus, ESRD on HD. Patient presented secondary to left arm swelling in setting of recent AV fistula surgery and recent wound dehiscence. Vascular surgery consulted and performed AV fistula washout with wound vac placement. Patient with significant pleuritic chest pain. Workup significant for worsened pericardial effusion with symptoms consistent with pericarditis. Prednisone  with taper started.  Repeat 2D echo concerning for pericardial effusion without tamponade.  Pericardiocentesis attempted per cardiology however recommended CT surgery and pericardial window placed 10/05/2024.  Cardiology, nephrology, CT surgery following.    Assessment & Plan:   Principal Problem:   Problem with dialysis access Active Problems:   ESRD (end stage renal disease) (HCC)   Acute on chronic left systolic heart failure (HCC)   Essential hypertension   Acute pericarditis   GERD without esophagitis   Elevated troponin   Pericardial effusion   HFrEF (heart failure with reduced ejection fraction) (HCC)  ESRD (end stage renal disease) (HCC) On hemodialysis Hyperkalemia hyponatremia  Recent AV fistula, resultant dehiscence of wound.  Sp left arm brachio basilic fistula ligation with I&D, and placement of wound VAC.   Continue renal replacement therapy and ultrafiltration through tunneled catheter per nephrology.    Anemia of chronic renal disease, continue with EPO       Acute on chronic left systolic heart failure (HCC) Improved volume status, management per HD.   Essential hypertension Continue Norvasc  10 mg daily, carvedilol  25 mg twice daily, clonidine   0.1 mg daily, Lasix  80 mg daily, Avapro  300 mg.  Hyperkalemia -Lokelma  10 mg p.o. x 1 -Patient in hemodialysis -Per nephrology.   Acute pericarditis/pericardial effusion Patient with troponin of 118 with negative delta of 112. Echo with slightly improved LVEF of 40-45% with global hypokinesis. Cardiology was consulted for workup/management. Symptoms appeared to improved with ongoing treatment for volume overload. Prednisone  started for concern for musculoskeletal component. Patient started on prednisone  for likely musculoskeletal etiology, however symptoms returned after taper. Per cardiology, etiology consistent with pericarditis; possibly related to lupus vs uremia. - Cardiology recommendations: prednisone  with taper   Repeat limited echo performed on 1/28 and is significant for a significant enlargement of the effusion. Cardiology consulted with initial plan for a pericardiocentesis on 1/30, which was deferred secondary to posterior fluid location. Repeat Transthoracic Echocardiogram (2/2) is significant for persistent effusion. - Cardiology recommendations: cardiothoracic surgery consultation for consideration of pericardial window -Patient seen by CT surgery underwent robotic assisted pericardial window 10/05/2024 -Pericardial drain in place. -CT surgery recommending to leave drain in place today and ambulate. - Per CT surgery and cardiology.     GERD without esophagitis PPI     DVT prophylaxis: SCD Code Status: Full Family Communication: Updated patient.  No family at bedside Disposition: TBD  Status is: Inpatient Remains inpatient appropriate because: Severity of illness   Consultants:  Vascular surgery Cardiology Nephrology Wound care RN Cardiothoracic surgery  Procedures:  Chest x-ray 10/05/2024, 10/06/2024 CT angiogram chest 09/16/2024 2D echo 09/17/2024, 09/27/2024, 09/29/2024, 10/02/2024 Upper extremity Dopplers 09/13/2024, 09/25/2024 Left arm brachiobasilic fistula  ligation--provide Dr. Lanis 09/15/2024 Incision and debridement 38 cm Kerecis biologic substitute placement Vacuum-assisted  dressing Wound washout and VAC placement per vascular surgery: Dr. Pearline 09/19/2024 Robotic assisted right video thoracoscopy/pericardial window/intercostal nerve block per CT surgery: Dr. Lightfoot 10/05/2018  Antimicrobials:  Anti-infectives (From admission, onward)    Start     Dose/Rate Route Frequency Ordered Stop   10/05/24 2000  ceFAZolin  (ANCEF ) IVPB 2g/100 mL premix  Status:  Discontinued        2 g 200 mL/hr over 30 Minutes Intravenous Every 8 hours 10/05/24 1900 10/05/24 1903   09/19/24 0900  ceFAZolin  (ANCEF ) IVPB 1 g/50 mL premix       Note to Pharmacy: Send with pt to OR   1 g 100 mL/hr over 30 Minutes Intravenous On call 09/18/24 0749 09/19/24 0944   09/18/24 1200  vancomycin  (VANCOREADY) IVPB 750 mg/150 mL  Status:  Discontinued        750 mg 150 mL/hr over 60 Minutes Intravenous Every M-W-F (Hemodialysis) 09/16/24 1008 09/23/24 1248   09/16/24 1200  ceFEPIme  (MAXIPIME ) 1 g in sodium chloride  0.9 % 100 mL IVPB  Status:  Discontinued        1 g 200 mL/hr over 30 Minutes Intravenous Every 24 hours 09/16/24 1006 09/23/24 1248   09/16/24 1100  vancomycin  (VANCOREADY) IVPB 1250 mg/250 mL        1,250 mg 166.7 mL/hr over 90 Minutes Intravenous  Once 09/16/24 1008 09/16/24 1237         Subjective: Patient seen in hemodialysis getting dialysis.  Patient states slight improvement with shortness of breath.  Patient with some complaints of right sided chest comfort around pericardial window.  Objective: Vitals:   10/06/24 1241 10/06/24 1326 10/06/24 1343 10/06/24 1648  BP: 122/76 125/83 124/73 126/87  Pulse: 69 77 78   Resp: 14 (!) 24 (!) 24 (!) 22  Temp:  98 F (36.7 C)  98.3 F (36.8 C)  TempSrc:  Oral  Oral  SpO2: 96% 97% 97% 97%  Weight:  69.4 kg    Height:        Intake/Output Summary (Last 24 hours) at 10/06/2024 1724 Last data filed  at 10/06/2024 1326 Gross per 24 hour  Intake 620 ml  Output 2600 ml  Net -1980 ml   Filed Weights   10/04/24 1618 10/06/24 0500 10/06/24 1326  Weight: 71 kg 71.2 kg 69.4 kg    Examination:  General exam: Appears calm and comfortable  Respiratory system: CTAB anterior lung fields.  Decreased breath sounds in the bases no wheezes, no crackles, no rhonchi.  Cardiovascular system: S1 & S2 heard, RRR. No JVD, murmurs, rubs, gallops or clicks. No pedal edema.  Drain in place with serosanguineous drainage. Gastrointestinal system: Abdomen is nondistended, soft and nontender. No organomegaly or masses felt. Normal bowel sounds heard. Central nervous system: Alert and oriented. No focal neurological deficits. Extremities: Symmetric 5 x 5 power. Skin: No rashes, lesions or ulcers Psychiatry: Judgement and insight appear normal. Mood & affect appropriate.     Data Reviewed: I have personally reviewed following labs and imaging studies  CBC: Recent Labs  Lab 09/30/24 0508 10/01/24 0458 10/02/24 0305 10/03/24 0351 10/04/24 0456 10/05/24 0324 10/06/24 0401  WBC 19.5*   < > 27.8* 22.4* 25.3* 21.4* 26.0*  NEUTROABS 17.0*  --   --   --   --   --   --   HGB 8.7*   < > 9.8* 9.3* 9.0* 9.3* 9.6*  HCT 28.2*   < > 31.8* 31.0* 30.4* 30.7* 31.9*  MCV 88.4   < >  89.1 89.1 89.9 89.0 89.6  PLT 383   < > 475* 480* 477* 493* 481*   < > = values in this interval not displayed.    Basic Metabolic Panel: Recent Labs  Lab 10/02/24 0305 10/03/24 0351 10/04/24 0456 10/05/24 0324 10/06/24 0401  NA 135 133* 133* 131* 129*  K 4.6 4.8 5.4* 4.9 6.0*  CL 91* 93* 92* 91* 90*  CO2 23 27 26 26  21*  GLUCOSE 107* 111* 100* 112* 119*  BUN 72* 47* 73* 57* 84*  CREATININE 6.40* 4.42* 5.91* 5.15* 6.82*  CALCIUM  10.4* 10.4* 10.5* 10.1 9.5  PHOS  --   --  3.9  --   --     GFR: Estimated Creatinine Clearance: 11.4 mL/min (A) (by C-G formula based on SCr of 6.82 mg/dL (H)).  Liver Function Tests: Recent  Labs  Lab 10/04/24 0456  ALBUMIN  3.2*    CBG: No results for input(s): GLUCAP in the last 168 hours.   No results found for this or any previous visit (from the past 240 hours).       Radiology Studies: DG Chest Port 1 View Result Date: 10/06/2024 CLINICAL DATA:  Pneumothorax. EXAM: PORTABLE CHEST 1 VIEW COMPARISON:  10/05/2024 FINDINGS: Right jugular dialysis catheter is stable with the tip near the superior cavoatrial junction. Right chest tube is stable with the tip in the medial right upper chest. No definite pneumothorax. Subcutaneous gas in the right chest is not significant changed. Stable enlargement of the cardiac silhouette. Prominent central vascular structures without overt pulmonary edema. Hazy densities along the right costophrenic angle could be associated with atelectasis. IMPRESSION: 1. No definite pneumothorax. Right chest tube is stable. 2. Stable subcutaneous gas in the right chest. 3. Stable cardiomegaly with prominent central vascular structures. Electronically Signed   By: Juliene Balder M.D.   On: 10/06/2024 11:20   DG Chest Port 1 View Result Date: 10/05/2024 CLINICAL DATA:  Pneumothorax. EXAM: PORTABLE CHEST 1 VIEW COMPARISON:  09/28/2024 FINDINGS: Right-sided chest tube in place, the tube courses to the apex with tip directed inferiorly. No convincing pneumothorax. Right-sided dialysis catheter in place. Slightly diminished size of cardiomediastinal silhouette, cardiomegaly persists. Suspect small pleural effusions. No pulmonary edema. IMPRESSION: 1. Right-sided chest tube in place. No convincing pneumothorax. 2. Suspect small pleural effusions. 3. Diminished size of the cardiomediastinal silhouette, cardiomegaly persists. Electronically Signed   By: Andrea Gasman M.D.   On: 10/05/2024 19:04        Scheduled Meds:  amLODipine   10 mg Oral QHS   bisacodyl   10 mg Oral Daily   carvedilol   25 mg Oral BID WC   Chlorhexidine  Gluconate Cloth  6 each Topical Q0600    cloNIDine   0.1 mg Oral Daily   darbepoetin (ARANESP ) injection - DIALYSIS  200 mcg Subcutaneous Q Sun-1800   diclofenac  Sodium  2 g Topical QID   furosemide   80 mg Oral Daily   irbesartan   300 mg Oral Daily   pantoprazole   40 mg Oral Daily   predniSONE   40 mg Oral Q breakfast   senna-docusate  1 tablet Oral QHS   sevelamer  carbonate  2.4 g Oral TID WC   sodium zirconium cyclosilicate   10 g Oral Once   Continuous Infusions:  anticoagulant sodium citrate        LOS: 22 days    Time spent: 40 minutes    Toribio Hummer, MD [facility]   To contact the attending provider between 7A-7P or the covering provider during after  hours 7P-7A, please log into the web site [email or web address] and access using universal [place] password for that web site. If you do not have the password, please call the hospital operator.  10/06/2024, 5:24 PM    "

## 2024-10-16 ENCOUNTER — Ambulatory Visit (HOSPITAL_COMMUNITY)

## 2024-10-18 ENCOUNTER — Ambulatory Visit (HOSPITAL_COMMUNITY)

## 2024-10-23 ENCOUNTER — Ambulatory Visit

## 2024-10-24 ENCOUNTER — Ambulatory Visit: Admitting: Nurse Practitioner

## 2024-11-02 ENCOUNTER — Encounter: Admitting: Vascular Surgery

## 2024-11-02 ENCOUNTER — Ambulatory Visit (HOSPITAL_COMMUNITY)
